# Patient Record
Sex: Female | Born: 1935 | Race: Black or African American | Hispanic: No | Smoking: Current some day smoker
Health system: Southern US, Community
[De-identification: ages and names within clinical notes are randomized; demographics above are authoritative.]

## PROBLEM LIST (undated history)

## (undated) DIAGNOSIS — Z8042 Family history of malignant neoplasm of prostate: Secondary | ICD-10-CM

## (undated) DIAGNOSIS — K635 Polyp of colon: Secondary | ICD-10-CM

## (undated) DIAGNOSIS — Z803 Family history of malignant neoplasm of breast: Secondary | ICD-10-CM

## (undated) DIAGNOSIS — C787 Secondary malignant neoplasm of liver and intrahepatic bile duct: Secondary | ICD-10-CM

## (undated) DIAGNOSIS — D6481 Anemia due to antineoplastic chemotherapy: Secondary | ICD-10-CM

## (undated) DIAGNOSIS — Z860101 Personal history of adenomatous and serrated colon polyps: Secondary | ICD-10-CM

## (undated) DIAGNOSIS — K2210 Ulcer of esophagus without bleeding: Secondary | ICD-10-CM

## (undated) DIAGNOSIS — K222 Esophageal obstruction: Secondary | ICD-10-CM

## (undated) DIAGNOSIS — F329 Major depressive disorder, single episode, unspecified: Secondary | ICD-10-CM

## (undated) DIAGNOSIS — Z79891 Long term (current) use of opiate analgesic: Secondary | ICD-10-CM

## (undated) DIAGNOSIS — Z66 Do not resuscitate: Secondary | ICD-10-CM

## (undated) DIAGNOSIS — F419 Anxiety disorder, unspecified: Secondary | ICD-10-CM

## (undated) DIAGNOSIS — I10 Essential (primary) hypertension: Secondary | ICD-10-CM

## (undated) DIAGNOSIS — Z86010 Personal history of colonic polyps: Secondary | ICD-10-CM

## (undated) DIAGNOSIS — N200 Calculus of kidney: Secondary | ICD-10-CM

## (undated) DIAGNOSIS — K529 Noninfective gastroenteritis and colitis, unspecified: Secondary | ICD-10-CM

## (undated) DIAGNOSIS — T451X5A Adverse effect of antineoplastic and immunosuppressive drugs, initial encounter: Principal | ICD-10-CM

## (undated) DIAGNOSIS — K219 Gastro-esophageal reflux disease without esophagitis: Secondary | ICD-10-CM

## (undated) DIAGNOSIS — F32A Depression, unspecified: Secondary | ICD-10-CM

## (undated) DIAGNOSIS — C259 Malignant neoplasm of pancreas, unspecified: Secondary | ICD-10-CM

## (undated) HISTORY — DX: Anemia due to antineoplastic chemotherapy: D64.81

## (undated) HISTORY — DX: Essential (primary) hypertension: I10

## (undated) HISTORY — DX: Long term (current) use of opiate analgesic: Z79.891

## (undated) HISTORY — DX: Polyp of colon: K63.5

## (undated) HISTORY — DX: Family history of malignant neoplasm of prostate: Z80.42

## (undated) HISTORY — DX: Esophageal obstruction: K22.2

## (undated) HISTORY — DX: Personal history of adenomatous and serrated colon polyps: Z86.0101

## (undated) HISTORY — DX: Major depressive disorder, single episode, unspecified: F32.9

## (undated) HISTORY — DX: Calculus of kidney: N20.0

## (undated) HISTORY — DX: Do not resuscitate: Z66

## (undated) HISTORY — DX: Adverse effect of antineoplastic and immunosuppressive drugs, initial encounter: T45.1X5A

## (undated) HISTORY — DX: Personal history of colonic polyps: Z86.010

## (undated) HISTORY — DX: Ulcer of esophagus without bleeding: K22.10

## (undated) HISTORY — DX: Depression, unspecified: F32.A

## (undated) HISTORY — DX: Secondary malignant neoplasm of liver and intrahepatic bile duct: C78.7

## (undated) HISTORY — PX: BREAST LUMPECTOMY: SHX2

## (undated) HISTORY — DX: Gastro-esophageal reflux disease without esophagitis: K21.9

## (undated) HISTORY — DX: Noninfective gastroenteritis and colitis, unspecified: K52.9

## (undated) HISTORY — DX: Malignant neoplasm of pancreas, unspecified: C25.9

## (undated) HISTORY — DX: Anxiety disorder, unspecified: F41.9

## (undated) HISTORY — DX: Family history of malignant neoplasm of breast: Z80.3

---

## 1963-05-13 HISTORY — PX: CHOLECYSTECTOMY: SHX55

## 2000-09-01 ENCOUNTER — Ambulatory Visit (HOSPITAL_COMMUNITY): Admission: RE | Admit: 2000-09-01 | Discharge: 2000-09-01 | Payer: Self-pay | Admitting: Internal Medicine

## 2000-09-15 ENCOUNTER — Emergency Department (HOSPITAL_COMMUNITY): Admission: EM | Admit: 2000-09-15 | Discharge: 2000-09-15 | Payer: Self-pay | Admitting: *Deleted

## 2001-02-22 ENCOUNTER — Ambulatory Visit (HOSPITAL_COMMUNITY): Admission: RE | Admit: 2001-02-22 | Discharge: 2001-02-22 | Payer: Self-pay | Admitting: Pulmonary Disease

## 2001-06-02 ENCOUNTER — Ambulatory Visit (HOSPITAL_COMMUNITY): Admission: RE | Admit: 2001-06-02 | Discharge: 2001-06-02 | Payer: Self-pay | Admitting: Pulmonary Disease

## 2002-03-21 ENCOUNTER — Ambulatory Visit (HOSPITAL_COMMUNITY): Admission: RE | Admit: 2002-03-21 | Discharge: 2002-03-21 | Payer: Self-pay | Admitting: Internal Medicine

## 2002-06-03 ENCOUNTER — Ambulatory Visit (HOSPITAL_COMMUNITY): Admission: RE | Admit: 2002-06-03 | Discharge: 2002-06-03 | Payer: Self-pay | Admitting: Pulmonary Disease

## 2003-10-18 ENCOUNTER — Ambulatory Visit (HOSPITAL_COMMUNITY): Admission: RE | Admit: 2003-10-18 | Discharge: 2003-10-18 | Payer: Self-pay | Admitting: Internal Medicine

## 2003-11-22 ENCOUNTER — Ambulatory Visit (HOSPITAL_COMMUNITY): Admission: RE | Admit: 2003-11-22 | Discharge: 2003-11-22 | Payer: Self-pay | Admitting: Internal Medicine

## 2003-11-22 HISTORY — PX: ESOPHAGOGASTRODUODENOSCOPY: SHX1529

## 2004-01-09 ENCOUNTER — Ambulatory Visit (HOSPITAL_COMMUNITY): Admission: RE | Admit: 2004-01-09 | Discharge: 2004-01-09 | Payer: Self-pay | Admitting: Internal Medicine

## 2004-05-01 ENCOUNTER — Ambulatory Visit (HOSPITAL_COMMUNITY): Admission: RE | Admit: 2004-05-01 | Discharge: 2004-05-01 | Payer: Self-pay | Admitting: Pulmonary Disease

## 2004-05-23 ENCOUNTER — Ambulatory Visit (HOSPITAL_COMMUNITY): Admission: RE | Admit: 2004-05-23 | Discharge: 2004-05-23 | Payer: Self-pay | Admitting: Pulmonary Disease

## 2004-12-12 ENCOUNTER — Ambulatory Visit (HOSPITAL_COMMUNITY): Admission: RE | Admit: 2004-12-12 | Discharge: 2004-12-12 | Payer: Self-pay | Admitting: Internal Medicine

## 2004-12-12 ENCOUNTER — Encounter: Payer: Self-pay | Admitting: Internal Medicine

## 2004-12-12 ENCOUNTER — Encounter: Payer: Self-pay | Admitting: Family Medicine

## 2004-12-12 ENCOUNTER — Ambulatory Visit: Payer: Self-pay | Admitting: Internal Medicine

## 2005-01-06 ENCOUNTER — Ambulatory Visit (HOSPITAL_COMMUNITY): Admission: RE | Admit: 2005-01-06 | Discharge: 2005-01-06 | Payer: Self-pay | Admitting: Pulmonary Disease

## 2005-06-23 ENCOUNTER — Ambulatory Visit (HOSPITAL_COMMUNITY): Admission: RE | Admit: 2005-06-23 | Discharge: 2005-06-23 | Payer: Self-pay | Admitting: Pulmonary Disease

## 2005-11-25 ENCOUNTER — Ambulatory Visit (HOSPITAL_COMMUNITY): Admission: RE | Admit: 2005-11-25 | Discharge: 2005-11-25 | Payer: Self-pay | Admitting: Pulmonary Disease

## 2005-12-15 ENCOUNTER — Ambulatory Visit (HOSPITAL_COMMUNITY): Admission: RE | Admit: 2005-12-15 | Discharge: 2005-12-15 | Payer: Self-pay | Admitting: Urology

## 2006-05-18 ENCOUNTER — Ambulatory Visit: Payer: Self-pay | Admitting: Orthopedic Surgery

## 2006-06-01 ENCOUNTER — Ambulatory Visit: Payer: Self-pay | Admitting: Orthopedic Surgery

## 2006-06-04 ENCOUNTER — Encounter (HOSPITAL_COMMUNITY): Admission: RE | Admit: 2006-06-04 | Discharge: 2006-07-04 | Payer: Self-pay | Admitting: Orthopedic Surgery

## 2006-06-22 ENCOUNTER — Ambulatory Visit: Payer: Self-pay | Admitting: Orthopedic Surgery

## 2006-07-06 ENCOUNTER — Encounter (HOSPITAL_COMMUNITY): Admission: RE | Admit: 2006-07-06 | Discharge: 2006-08-05 | Payer: Self-pay | Admitting: Orthopedic Surgery

## 2006-07-17 ENCOUNTER — Ambulatory Visit (HOSPITAL_COMMUNITY): Admission: RE | Admit: 2006-07-17 | Discharge: 2006-07-17 | Payer: Self-pay | Admitting: Pulmonary Disease

## 2006-07-27 ENCOUNTER — Ambulatory Visit: Payer: Self-pay | Admitting: Orthopedic Surgery

## 2006-11-16 ENCOUNTER — Ambulatory Visit: Payer: Self-pay | Admitting: Orthopedic Surgery

## 2006-12-01 ENCOUNTER — Ambulatory Visit: Payer: Self-pay | Admitting: Gastroenterology

## 2006-12-15 ENCOUNTER — Ambulatory Visit: Payer: Self-pay | Admitting: Internal Medicine

## 2006-12-15 ENCOUNTER — Encounter: Payer: Self-pay | Admitting: Family Medicine

## 2006-12-15 ENCOUNTER — Encounter: Payer: Self-pay | Admitting: Internal Medicine

## 2006-12-15 ENCOUNTER — Ambulatory Visit (HOSPITAL_COMMUNITY): Admission: RE | Admit: 2006-12-15 | Discharge: 2006-12-15 | Payer: Self-pay | Admitting: Internal Medicine

## 2006-12-31 ENCOUNTER — Ambulatory Visit: Payer: Self-pay | Admitting: Internal Medicine

## 2007-02-17 ENCOUNTER — Ambulatory Visit: Payer: Self-pay | Admitting: Orthopedic Surgery

## 2007-02-17 DIAGNOSIS — M19019 Primary osteoarthritis, unspecified shoulder: Secondary | ICD-10-CM | POA: Insufficient documentation

## 2007-02-24 ENCOUNTER — Ambulatory Visit (HOSPITAL_COMMUNITY): Admission: RE | Admit: 2007-02-24 | Discharge: 2007-02-24 | Payer: Self-pay | Admitting: Orthopedic Surgery

## 2007-03-01 ENCOUNTER — Ambulatory Visit: Payer: Self-pay | Admitting: Orthopedic Surgery

## 2007-03-01 DIAGNOSIS — M75120 Complete rotator cuff tear or rupture of unspecified shoulder, not specified as traumatic: Secondary | ICD-10-CM | POA: Insufficient documentation

## 2007-04-14 ENCOUNTER — Ambulatory Visit: Payer: Self-pay | Admitting: Orthopedic Surgery

## 2007-04-14 DIAGNOSIS — M479 Spondylosis, unspecified: Secondary | ICD-10-CM | POA: Insufficient documentation

## 2007-04-22 ENCOUNTER — Encounter: Payer: Self-pay | Admitting: Orthopedic Surgery

## 2007-05-13 HISTORY — PX: OTHER SURGICAL HISTORY: SHX169

## 2007-05-19 ENCOUNTER — Telehealth: Payer: Self-pay | Admitting: Orthopedic Surgery

## 2007-05-20 ENCOUNTER — Telehealth: Payer: Self-pay | Admitting: Orthopedic Surgery

## 2007-05-24 ENCOUNTER — Ambulatory Visit: Payer: Self-pay | Admitting: Orthopedic Surgery

## 2007-05-28 ENCOUNTER — Encounter: Payer: Self-pay | Admitting: Orthopedic Surgery

## 2007-05-28 ENCOUNTER — Ambulatory Visit: Payer: Self-pay | Admitting: Orthopedic Surgery

## 2007-05-28 ENCOUNTER — Ambulatory Visit (HOSPITAL_COMMUNITY): Admission: RE | Admit: 2007-05-28 | Discharge: 2007-05-28 | Payer: Self-pay | Admitting: Orthopedic Surgery

## 2007-06-01 ENCOUNTER — Ambulatory Visit: Payer: Self-pay | Admitting: Orthopedic Surgery

## 2007-06-07 ENCOUNTER — Encounter (HOSPITAL_COMMUNITY): Admission: RE | Admit: 2007-06-07 | Discharge: 2007-07-07 | Payer: Self-pay | Admitting: Orthopedic Surgery

## 2007-06-09 ENCOUNTER — Encounter: Payer: Self-pay | Admitting: Orthopedic Surgery

## 2007-06-10 ENCOUNTER — Ambulatory Visit: Payer: Self-pay | Admitting: Orthopedic Surgery

## 2007-06-29 ENCOUNTER — Encounter: Payer: Self-pay | Admitting: Orthopedic Surgery

## 2007-07-01 ENCOUNTER — Encounter: Payer: Self-pay | Admitting: Orthopedic Surgery

## 2007-07-03 ENCOUNTER — Encounter: Payer: Self-pay | Admitting: Family Medicine

## 2007-07-09 ENCOUNTER — Encounter (HOSPITAL_COMMUNITY): Admission: RE | Admit: 2007-07-09 | Discharge: 2007-08-08 | Payer: Self-pay | Admitting: Orthopedic Surgery

## 2007-07-14 ENCOUNTER — Ambulatory Visit: Payer: Self-pay | Admitting: Orthopedic Surgery

## 2007-07-22 ENCOUNTER — Encounter: Payer: Self-pay | Admitting: Orthopedic Surgery

## 2008-06-28 ENCOUNTER — Ambulatory Visit (HOSPITAL_COMMUNITY): Admission: RE | Admit: 2008-06-28 | Discharge: 2008-06-28 | Payer: Self-pay | Admitting: Pulmonary Disease

## 2008-10-18 ENCOUNTER — Ambulatory Visit (HOSPITAL_COMMUNITY): Admission: RE | Admit: 2008-10-18 | Discharge: 2008-10-18 | Payer: Self-pay | Admitting: Pulmonary Disease

## 2008-10-18 ENCOUNTER — Encounter: Payer: Self-pay | Admitting: Family Medicine

## 2009-01-14 ENCOUNTER — Emergency Department (HOSPITAL_COMMUNITY): Admission: EM | Admit: 2009-01-14 | Discharge: 2009-01-14 | Payer: Self-pay | Admitting: Emergency Medicine

## 2009-07-02 ENCOUNTER — Ambulatory Visit (HOSPITAL_COMMUNITY): Admission: RE | Admit: 2009-07-02 | Discharge: 2009-07-02 | Payer: Self-pay | Admitting: Obstetrics and Gynecology

## 2010-01-07 ENCOUNTER — Ambulatory Visit: Payer: Self-pay | Admitting: Family Medicine

## 2010-01-07 DIAGNOSIS — E663 Overweight: Secondary | ICD-10-CM | POA: Insufficient documentation

## 2010-01-07 DIAGNOSIS — I10 Essential (primary) hypertension: Secondary | ICD-10-CM | POA: Insufficient documentation

## 2010-01-08 ENCOUNTER — Encounter: Payer: Self-pay | Admitting: Family Medicine

## 2010-01-17 ENCOUNTER — Encounter (INDEPENDENT_AMBULATORY_CARE_PROVIDER_SITE_OTHER): Payer: Self-pay | Admitting: *Deleted

## 2010-01-23 ENCOUNTER — Ambulatory Visit: Payer: Self-pay | Admitting: Family Medicine

## 2010-01-25 ENCOUNTER — Encounter: Payer: Self-pay | Admitting: Family Medicine

## 2010-01-25 LAB — CONVERTED CEMR LAB
ALT: 17 units/L (ref 0–35)
AST: 17 units/L (ref 0–37)
Albumin: 4.5 g/dL (ref 3.5–5.2)
BUN: 18 mg/dL (ref 6–23)
Basophils Relative: 0 % (ref 0–1)
CO2: 31 meq/L (ref 19–32)
Calcium: 9.3 mg/dL (ref 8.4–10.5)
Creatinine, Ser: 0.77 mg/dL (ref 0.40–1.20)
Eosinophils Absolute: 0.1 10*3/uL (ref 0.0–0.7)
Eosinophils Relative: 2 % (ref 0–5)
Glucose, Bld: 89 mg/dL (ref 70–99)
HCT: 41.4 % (ref 36.0–46.0)
HDL: 70 mg/dL (ref >39)
Hemoglobin: 14.1 g/dL (ref 12.0–15.0)
LDL Cholesterol: 134 mg/dL — ABNORMAL HIGH (ref 0–99)
MCV: 83.8 fL (ref 78.0–100.0)
Neutro Abs: 4.8 10*3/uL (ref 1.7–7.7)
Potassium: 4.2 meq/L (ref 3.5–5.3)
RDW: 15 % (ref 11.5–15.5)
Sodium: 143 meq/L (ref 135–145)
TSH: 0.837 microintl units/mL (ref 0.350–4.500)
Triglycerides: 83 mg/dL (ref <150)
WBC: 7.1 10*3/uL (ref 4.0–10.5)

## 2010-01-27 DIAGNOSIS — L259 Unspecified contact dermatitis, unspecified cause: Secondary | ICD-10-CM | POA: Insufficient documentation

## 2010-02-05 ENCOUNTER — Ambulatory Visit (HOSPITAL_COMMUNITY): Payer: Self-pay | Admitting: Psychiatry

## 2010-02-13 ENCOUNTER — Ambulatory Visit: Payer: Self-pay | Admitting: Family Medicine

## 2010-02-21 ENCOUNTER — Ambulatory Visit (HOSPITAL_COMMUNITY): Payer: Self-pay | Admitting: Psychiatry

## 2010-03-07 ENCOUNTER — Ambulatory Visit (HOSPITAL_COMMUNITY): Payer: Self-pay | Admitting: Psychiatry

## 2010-03-08 ENCOUNTER — Encounter: Payer: Self-pay | Admitting: Internal Medicine

## 2010-03-19 ENCOUNTER — Ambulatory Visit: Payer: Self-pay | Admitting: Internal Medicine

## 2010-03-19 ENCOUNTER — Ambulatory Visit (HOSPITAL_COMMUNITY): Admission: RE | Admit: 2010-03-19 | Discharge: 2010-03-19 | Payer: Self-pay | Admitting: Internal Medicine

## 2010-03-19 DIAGNOSIS — K635 Polyp of colon: Secondary | ICD-10-CM

## 2010-03-19 HISTORY — DX: Polyp of colon: K63.5

## 2010-03-19 HISTORY — PX: COLONOSCOPY: SHX174

## 2010-04-17 ENCOUNTER — Ambulatory Visit: Payer: Self-pay | Admitting: Internal Medicine

## 2010-04-17 DIAGNOSIS — R197 Diarrhea, unspecified: Secondary | ICD-10-CM | POA: Insufficient documentation

## 2010-04-18 ENCOUNTER — Ambulatory Visit: Payer: Self-pay | Admitting: Family Medicine

## 2010-04-18 ENCOUNTER — Encounter: Payer: Self-pay | Admitting: Internal Medicine

## 2010-04-18 LAB — CONVERTED CEMR LAB: Tissue Transglutaminase Ab, IgA: 7.6 units (ref <20)

## 2010-05-14 ENCOUNTER — Telehealth (INDEPENDENT_AMBULATORY_CARE_PROVIDER_SITE_OTHER): Payer: Self-pay | Admitting: *Deleted

## 2010-05-15 ENCOUNTER — Telehealth (INDEPENDENT_AMBULATORY_CARE_PROVIDER_SITE_OTHER): Payer: Self-pay

## 2010-06-02 ENCOUNTER — Encounter: Payer: Self-pay | Admitting: Pulmonary Disease

## 2010-06-03 ENCOUNTER — Other Ambulatory Visit: Payer: Self-pay | Admitting: Family Medicine

## 2010-06-03 DIAGNOSIS — Z139 Encounter for screening, unspecified: Secondary | ICD-10-CM

## 2010-06-07 ENCOUNTER — Ambulatory Visit (HOSPITAL_COMMUNITY): Payer: Self-pay

## 2010-06-11 NOTE — Assessment & Plan Note (Signed)
Summary: NEW PATIENT PER DR   Vital Signs:  Patient profile:   77 year old female Menstrual status:  hysterectomy Height:      63.5 inches Weight:      168.25 pounds BMI:     29.44 O2 Sat:      98 % Pulse rate:   60 / minute Pulse rhythm:   regular Resp:     16 per minute BP sitting:   140 / 82  (left arm)  Vitals Entered By: Everitt Amber LPN (January 07, 2010 1:29 PM)  Nutrition Counseling: Patient's BMI is greater than 25 and therefore counseled on weight management options. CC: Dr. Juanetta Gosling was giving her Exforge and he increased the dose to 5/320 and she broke out in a bad rash and she stopped it and it still hasn't went away      Menstrual Status hysterectomy   CC:  Dr. Juanetta Gosling was giving her Exforge and he increased the dose to 5/320 and she broke out in a bad rash and she stopped it and it still hasn't went away .  History of Present Illness: new pt eval for this recent widow, who has noted increased anxiety, withexcessive crying at times and difficulty sleeping also. he has a goodfamily support, amongst her siblings, her 2 adult children live in [place], they are suppoirrtive, and she is willing o consider going to spend some time with them. Her spouse of 52 yrs passedunexpectedly after a short battle with pancreatic ca withn 1 month of diagnosi. She ha recently been having problems with her Bp , reports an adverse rxn to exforge, rash in albow folds also behind her neck, I hardly think such isolated rashes are due to the drug. She reports poor sleep and weight gain in the past several months dueto poor eaing habits.   Preventive Screening-Counseling & Management  Alcohol-Tobacco     Smoking Status: never  Caffeine-Diet-Exercise     Does Patient Exercise: no      Drug Use:  no.    Current Medications (verified): 1)  Aleve .... Uad 2)  Clonazepam 0.5 Mg Tabs (Clonazepam) .... Take 1 Tablet By Mouth Two Times A Day 3)  Bystolic 10 Mg Tabs (Nebivolol Hcl) .... Take  1 Tablet By Mouth Once A Day 4)  Cetirizine Hcl 10 Mg Tabs (Cetirizine Hcl) .... Take 1 Tablet By Mouth Once A Day 5)  Azor 10-40 Mg Tabs (Amlodipine-Olmesartan) .... Take 1 Tablet By Mouth Once A Day 6)  Geritol Complete  Tabs (Iron-Vitamins) .... Take 1 Tablet By Mouth Once A Day 7)  Vitamin E Complex 1000 Unit Caps (Vitamin E) .... Take 1 Tablet By Mouth Once A Day  Allergies: 1)  ! Penicillin 2)  ! * Exforge  Past History:  Past Medical History: Hypertension x 20 years  Past Surgical History: Stomach ulcer (had some of stomach removed) x 50 years ago Cholecystectomy x 30 years ago  Family History: Family History of Diabetes Family History of Arthritis Hx, family, kidney disease NEC  Mother-deceased-HTN  Father-deceased-unknown 2 brothers-deceased-one had been on dialysis 2 sisters with Diabetes Sister-colon cancer  Social History: Retired Widow since 08/2009 after 52 yrs of marriage quit smoking in 2009 Alcohol use-no Drug use-no Regular exercise-yes sometimes Drug Use:  no Does Patient Exercise:  no  Review of Systems      See HPI General:  Complains of fatigue, loss of appetite, and sleep disorder; denies chills and fever. Eyes:  Denies eye pain and red  eye. ENT:  Denies hoarseness, nasal congestion, sinus pressure, and sore throat. CV:  Denies chest pain or discomfort, difficulty breathing while lying down, palpitations, and swelling of feet. Resp:  Denies cough, pleuritic, and sputum productive. GI:  Complains of diarrhea; 2 year h/o diarreah, uses immodium twice per week. GU:  Denies dysuria, incontinence, and urinary frequency. MS:  Complains of joint pain, low back pain, mid back pain, and stiffness; mild to moderate symptoms. Derm:  Complains of itching and lesion(s); dark itchy rash in elbow creases and behind neck. Neuro:  Complains of memory loss; denies headaches, seizures, and sensation of room spinning; unable to find her car keys. Psych:   Complains of anxiety, depression, easily tearful, mental problems, and unusual visions or sounds; denies easily angered, suicidal thoughts/plans, and thoughts of violence; states she sometimes thinks she sees her deceased spouse, is interested both in therapy as well as meds, never had mental health issues in the past. Endo:  Denies excessive thirst and excessive urination. Heme:  Denies abnormal bruising and bleeding. Allergy:  Complains of seasonal allergies; denies hives or rash and itching eyes.  Physical Exam  General:  Well-developed,well-nourished,in no acute distress; alert,appropriate and cooperative throughout examination HEENT: No facial asymmetry,  EOMI, No sinus tenderness, TM's Clear, oropharynx  pink and moist.   Chest: Clear to auscultation bilaterally.  CVS: S1, S2, No murmurs, No S3.   Abd: Soft, Nontender.  MS: Adequate ROM spine, hips, shoulders and knees.  Ext: No edema.   CNS: CN 2-12 intact, power tone and sensation normal throughout.   Skin: Intact, hyperpigmented macular rashes on elbow creases anteriorly also on back of neck left side  Psych: Good eye contact, normal affect.  Memory intact, mildly anxious and  depressed appearing.Tearful at times.      Impression & Recommendations:  Problem # 1:  DEPRESSION (ICD-311) Assessment Deteriorated  Her updated medication list for this problem includes:    Clonazepam 0.5 Mg Tabs (Clonazepam) .Marland Kitchen... Take 1 tablet by mouth two times a day    Citalopram Hydrobromide 10 Mg Tabs (Citalopram hydrobromide) .Marland Kitchen... Take 1 tablet by mouth once a day  Discussed treatment options, including trial of antidpressant medication. Will refer to behavioral health. Follow-up call in in 24-48 hours and recheck in 2 weeks, sooner as needed. Patient agrees to call if any worsening of symptoms or thoughts of doing harm arise. Verified that the patient has no suicidal ideation at this time.  will refer pt for therapy through hospice, and she is  to resume walking regularly  Problem # 2:  MOURNING (ICD-309.0) Assessment: Comment Only i DISCUSSED THE NATURAL PHENOMENON OF MOURNING THE LOSS OF A LOVED ONE AND ANTICIPATE IMPROVEMENT WITH TIME AND PSYCHOLOGICAL SUPPORT  Problem # 3:  ESSENTIAL HYPERTENSION (ICD-401.9) Assessment: Comment Only  Her updated medication list for this problem includes:    Bystolic 10 Mg Tabs (Nebivolol hcl) .Marland Kitchen... Take 1 tablet by mouth once a day    Azor 10-40 Mg Tabs (Amlodipine-olmesartan) .Marland Kitchen... Take 1 tablet by mouth once a day  BP today: 140/82, no med change at this time, dASH diet and exercise as well as reduced sodium intake discussed and encouraged  Problem # 4:  OVERWEIGHT (ICD-278.02) Assessment: Comment Only  Ht: 63.5 (01/07/2010)   Wt: 168.25 (01/07/2010)   BMI: 29.44 (01/07/2010), reduced caloric intake and regular exercise discussed and encouraged tofacilitate weight loss  Complete Medication List: 1)  Aleve  .... Uad 2)  Clonazepam 0.5 Mg Tabs (Clonazepam) .Marland KitchenMarland KitchenMarland Kitchen  Take 1 tablet by mouth two times a day 3)  Bystolic 10 Mg Tabs (Nebivolol hcl) .... Take 1 tablet by mouth once a day 4)  Cetirizine Hcl 10 Mg Tabs (Cetirizine hcl) .... Take 1 tablet by mouth once a day 5)  Azor 10-40 Mg Tabs (Amlodipine-olmesartan) .... Take 1 tablet by mouth once a day 6)  Geritol Complete Tabs (Iron-vitamins) .... Take 1 tablet by mouth once a day 7)  Vitamin E Complex 1000 Unit Caps (Vitamin e) .... Take 1 tablet by mouth once a day 8)  Citalopram Hydrobromide 10 Mg Tabs (Citalopram hydrobromide) .... Take 1 tablet by mouth once a day 9)  Azor 10-40 Mg Tabs (Amlodipine-olmesartan) .... Take 1 tablet by mouth once a day 10)  Betamethasone Valerate 0.1 % Crea (Betamethasone valerate) .... Apply sparingly twice daily for 10 days, then as needed  Other Orders: Tdap => 31yrs IM [phone]) Admin 1st Vaccine (60109) Admin 1st Vaccine [facility]) [phone]) Pneumococcal Vaccine (32202) Admin of Any Addtl Vaccine  (54270) Admin of Any Addtl Vaccine (State) [phone])  Patient Instructions: 1)  F/U in 6 weeks. 2)  no med changes at this time. 3)  New med for depression, and I will call hospice about getting therapy for you. 4)  Pls start walking at least 3 to 5 days per week. 5)  consider spending time away from here with your children, this will help 6)  Two vaccines today. 7)  Pls start 1 baby aspirin daily for cardiovascular health 8)  Flu vaccine will be available mid Sept we will call Prescriptions: BETAMETHASONE VALERATE 0.1 % CREA (BETAMETHASONE VALERATE) apply sparingly twice daily for 10 days, then as needed  #45 gm x 1   Entered and Authorized by:   Syliva Overman MD   Signed by:   Syliva Overman MD on 01/07/2010   Method used:   Printed then faxed to ...         RxID:   [id number] AZOR 10-40 MG TABS (AMLODIPINE-OLMESARTAN) Take 1 tablet by mouth once a day  #90 x 2   Entered and Authorized by:   Syliva Overman MD   Signed by:   Syliva Overman MD on 01/07/2010   Method used:   Printed then faxed to ...         RxID:   [id number] CITALOPRAM HYDROBROMIDE 10 MG TABS (CITALOPRAM HYDROBROMIDE) Take 1 tablet by mouth once a day  #30 x 4   Entered and Authorized by:   Syliva Overman MD   Signed by:   Syliva Overman MD on 01/07/2010   Method used:   Printed then faxed to ...         RxID:   [id number]        Tetanus/Td Vaccine    Vaccine Type: Tdap    Site: right deltoid    Mfr: boosterix    Dose: 0.5 ml    Route: IM    Given by: Everitt Amber LPN    Exp. Date: 11/09/2011    Lot #: [id number]  Pneumovax    Vaccine Type: Pneumovax    Site: left deltoid    Mfr: Merck    Dose: 0.5 ml    Route: IM    Given by: Everitt Amber LPN    Exp. Date: 05/29/2011    Lot #: [id number]    Appended Document: NEW PATIENT PER DR pls call hospice, let them know that htispt's husband died in May 11, 2011Erie Noe was their pt, she needs grief  counsellng see if therapist  aailable to see her for a couple of sessions, if not pls let me know  Appended Document: NEW PATIENT PER DR tried to contact patient, no answer. Mailed her a letter with all the information on it that she would need to get this service. Letter scanned in chart

## 2010-06-11 NOTE — Op Note (Signed)
Summary: colonoscopy with cold snare polypectomy  colonoscopy with cold snare polypectomy   Imported By: Lind Guest 01/09/2010 08:25:13  _____________________________________________________________________  External Attachment:    Type:   Image     Comment:   External Document

## 2010-06-11 NOTE — Letter (Signed)
Summary: [facility] ORDER/[facility]  [facility] ORDER/[facility]   Imported By: Rexene Alberts 03/08/2010 11:54:44  _____________________________________________________________________  External Attachment:    Type:   Image     Comment:   External Document

## 2010-06-11 NOTE — Assessment & Plan Note (Signed)
Summary: OV   Vital Signs:  Patient profile:   77 year old female Menstrual status:  hysterectomy Height:      63.5 inches Weight:      168.75 pounds BMI:     29.53 O2 Sat:      96 % Pulse rate:   55 / minute Pulse rhythm:   regular Resp:     16 per minute BP sitting:   150 / 82  (left arm) Cuff size:   large  Vitals Entered By: Everitt Amber LPN (January 23, 2010 11:06 AM)  Nutrition Counseling: Patient's BMI is greater than 25 and therefore counseled on weight management options. CC: Follow up chronic problems    CC:  Follow up chronic problems .  History of Present Illness: Pt continues to experience alot of anxiety despite SSRI, she is asking for an inc in her anxiolytic. She is now ready for therapy to help her to cope with her husbands's loss.she is still very sad and tearful and often overwhelmed.she will spend 1 week with one of her sons and his family out of state , and she is excited about this. She reports very poor sleep and remains fatigued all the time, sleeps at most 4 hrs per night. She denies any adverse s/r from her meds.She denies fever, chills , head or chest congestion, and wants to return to get her flu vac aftrer travel.  Current Medications (verified): 1)  Aleve .... Uad 2)  Clonazepam 0.5 Mg Tabs (Clonazepam) .... Take 1 Tablet By Mouth Two Times A Day 3)  Bystolic 10 Mg Tabs (Nebivolol Hcl) .... Take 1 Tablet By Mouth Once A Day 4)  Cetirizine Hcl 10 Mg Tabs (Cetirizine Hcl) .... Take 1 Tablet By Mouth Once A Day 5)  Geritol Complete  Tabs (Iron-Vitamins) .... Take 1 Tablet By Mouth Once A Day 6)  Vitamin E Complex 1000 Unit Caps (Vitamin E) .... Take 1 Tablet By Mouth Once A Day 7)  Citalopram Hydrobromide 10 Mg Tabs (Citalopram Hydrobromide) .... Take 1 Tablet By Mouth Once A Day 8)  Azor 10-40 Mg Tabs (Amlodipine-Olmesartan) .... Take 1 Tablet By Mouth Once A Day 9)  Betamethasone Valerate 0.1 % Crea (Betamethasone Valerate) .... Apply Sparingly  Twice Daily For 10 Days, Then As Needed 10)  Imodium Advanced 2-125 Mg Tabs (Loperamide-Simethicone) .... One Tab Every Other Day For Diarrhea  Allergies (verified): 1)  ! Penicillin 2)  ! * Exforge  Review of Systems      See HPI General:  Complains of fatigue and sleep disorder. Eyes:  Denies discharge and red eye. ENT:  Denies hoarseness, nasal congestion, and sinus pressure. CV:  Denies chest pain or discomfort, palpitations, and swelling of feet. Resp:  Denies cough and sputum productive. GI:  Complains of loss of appetite; denies abdominal pain, constipation, diarrhea, nausea, and vomiting. GU:  Denies dysuria and urinary frequency. MS:  Complains of joint pain. Derm:  Complains of lesion(s) and rash; REPORTS LITTLE IMPROVEMENT IN RASHES. Psych:  Complains of anxiety, depression, and easily tearful; denies suicidal thoughts/plans, thoughts of violence, and unusual visions or sounds. Endo:  Denies excessive thirst and excessive urination. Heme:  Denies abnormal bruising and bleeding. Allergy:  Complains of seasonal allergies.  Physical Exam  General:  Well-developed,well-nourished,in no acute distress; alert,appropriate and cooperative throughout examination. Tearful AND ANXIOUS. HEENT: No facial asymmetry,  EOMI, No sinus tenderness, TM's Clear, oropharynx  pink and moist.   Chest: Clear to auscultation bilaterally.  CVS: S1,  S2, No murmurs, No S3.   Abd: Soft, Nontender.  MS: Adequate ROM spine, hips, shoulders and knees.  Ext: No edema.   CNS: CN 2-12 intact, power tone and sensation normal throughout.   Skin: Intact, hyperpigmented macular rashes on elbow creases anteriorly also on back of neck left side  Psych: Good eye contact, normal affect.  Memory intact, mildly anxious and  depressed appearing.Tearful at times.      Impression & Recommendations:  Problem # 1:  MOURNING (ICD-309.0) Assessment Deteriorated  Orders: Psychology Referral (Psychology)  Problem  # 2:  ESSENTIAL HYPERTENSION (ICD-401.9) Assessment: Deteriorated  The following medications were removed from the medication list:    Azor 10-40 Mg Tabs (Amlodipine-olmesartan) .Marland Kitchen... Take 1 tablet by mouth once a day Her updated medication list for this problem includes:    Bystolic 10 Mg Tabs (Nebivolol hcl) .Marland Kitchen... Take 1 tablet by mouth once a day    Azor 10-40 Mg Tabs (Amlodipine-olmesartan) .Marland Kitchen... Take 1 tablet by mouth once a day  BP today: 150/82 Prior BP: 140/82 (01/07/2010)  Problem # 3:  DEPRESSION (ICD-311) Assessment: Deteriorated  The following medications were removed from the medication list:    Clonazepam 0.5 Mg Tabs (Clonazepam) .Marland Kitchen... Take 1 tablet by mouth two times a day Her updated medication list for this problem includes:    Citalopram Hydrobromide 10 Mg Tabs (Citalopram hydrobromide) .Marland Kitchen... Take 1 tablet by mouth once a day    Klonopin 0.5 Mg Tabs (Clonazepam) .Marland Kitchen... Take 1 tablet by mouth three times a day  Orders: Psychology Referral (Psychology)  Problem # 4:  DERMATITIS (ICD-692.9) Assessment: Unchanged  Her updated medication list for this problem includes:    Cetirizine Hcl 10 Mg Tabs (Cetirizine hcl) .Marland Kitchen... Take 1 tablet by mouth once a day    Betamethasone Valerate 0.1 % Crea (Betamethasone valerate) .Marland Kitchen... Apply sparingly twice daily for 10 days, then as needed hydroquinone prescribed also  Complete Medication List: 1)  Aleve  .... Uad 2)  Bystolic 10 Mg Tabs (Nebivolol hcl) .... Take 1 tablet by mouth once a day 3)  Cetirizine Hcl 10 Mg Tabs (Cetirizine hcl) .... Take 1 tablet by mouth once a day 4)  Geritol Complete Tabs (Iron-vitamins) .... Take 1 tablet by mouth once a day 5)  Vitamin E Complex 1000 Unit Caps (Vitamin e) .... Take 1 tablet by mouth once a day 6)  Citalopram Hydrobromide 10 Mg Tabs (Citalopram hydrobromide) .... Take 1 tablet by mouth once a day 7)  Azor 10-40 Mg Tabs (Amlodipine-olmesartan) .... Take 1 tablet by mouth once a  day 8)  Betamethasone Valerate 0.1 % Crea (Betamethasone valerate) .... Apply sparingly twice daily for 10 days, then as needed 9)  Imodium Advanced 2-125 Mg Tabs (Loperamide-simethicone) .... One tab every other day for diarrhea 10)  Temazepam 15 Mg Caps (Temazepam) .... Take 1 capsule by mouth at bedtime 11)  Klonopin 0.5 Mg Tabs (Clonazepam) .... Take 1 tablet by mouth three times a day 12)  Hydroquinone 4 % Crea (Hydroquinone) .... Apply paringly twice daily to rash for 10 days , then as needed  Other Orders: T-Basic Metabolic Panel [id number] T-Lipid Profile [phone]) T-Hepatic Function [phone]) T-CBC w/Diff [phone]) T-TSH [phone])  Patient Instructions: 1)  Please schedule a follow-up appointment in 1 month. 2)  BMP prior to visit, ICD-9: 3)  Hepatic Panel prior to visit, ICD-9: 4)  Lipid Panel prior to visit, ICD-9:  fasting today 5)  TSH prior to visit, ICD-9: 6)  CBC  w/ Diff prior to visit, ICD-9:,  7)  new med for sleep is added, pls practice good sleep hygiene. 8)  pls call for your flu vaccine 9)  continue all other meds as before. 10)  you will be referred for counselling Prescriptions: HYDROQUINONE 4 % CREA (HYDROQUINONE) APPLY PARINGLY TWICE DAILY TO RASH FOR 10 DAYS , THEN AS NEEDED  #45gm x 0   Entered and Authorized by:   Syliva Overman MD   Signed by:   Syliva Overman MD on 01/27/2010   Method used:   Historical   RxID:   [id number] KLONOPIN 0.5 MG TABS (CLONAZEPAM) Take 1 tablet by mouth three times a day  #90 x 2   Entered and Authorized by:   Syliva Overman MD   Signed by:   Syliva Overman MD on 01/23/2010   Method used:   Printed then faxed to ...       [facility]  [address] [phone]* (retail)       [address]       [place], [place]  [address]       Ph: [phone] or [phone]       Fax: [phone]   RxID:   [id number] TEMAZEPAM 15 MG CAPS (TEMAZEPAM) Take 1 capsule by mouth at bedtime  #30 x 1    Entered and Authorized by:   Syliva Overman MD   Signed by:   Syliva Overman MD on 01/23/2010   Method used:   Printed then faxed to ...       [facility]  [address] [phone]* (retail)       [address]       [place], [place]  [address]       Ph: [phone] or [phone]       Fax: [phone]   RxID:   [id number]

## 2010-06-11 NOTE — Assessment & Plan Note (Signed)
Summary: office visit   Vital Signs:  Patient profile:   77 year old female Menstrual status:  hysterectomy Height:      63.5 inches Weight:      168 pounds O2 Sat:      93 % on Room air Pulse rhythm:   regular Resp:     16 per minute BP sitting:   140 / 80  (left arm)  Vitals Entered By: Mauricia Area  O2 Flow:  Room air CC: follow up Is Patient Diabetic? No Pain Assessment Patient in pain? no        CC:  follow up.  History of Present Illness: Reports  that she has been doing better, since starting med for anxiety as well as ar Denies recent fever or chillsttending therapy, both with hospice and behav health, she will continue with the latter. Denies sinus pressure, nasal congestion , ear pain or sore throat. Denies chest congestion, or cough productive of sputum. Denies chest pain, palpitations, PND, orthopnea or leg swelling.Tolerating bP meds well. Denies abdominal pain, nausea, vomitting, diarrhea or constipation. Denies change in bowel movements or bloody stool. Denies dysuria , frequency, incontinence or hesitancy. Denies  joint pain, swelling, or reduced mobility. Denies headaches, vertigo, seizures. Impoved insommnia on meds which she is tolerating well..      Current Medications (verified): 1)  Bystolic 10 Mg Tabs (Nebivolol Hcl) .... Take 1 Tablet By Mouth Once A Day 2)  Cetirizine Hcl 10 Mg Tabs (Cetirizine Hcl) .... Take 1 Tablet By Mouth Once A Day 3)  Geritol Complete  Tabs (Iron-Vitamins) .... Take 1 Tablet By Mouth Once A Day 4)  Vitamin E Complex 1000 Unit Caps (Vitamin E) .... Take 1 Tablet By Mouth Once A Day 5)  Citalopram Hydrobromide 10 Mg Tabs (Citalopram Hydrobromide) .... Take 1 Tablet By Mouth Once A Day 6)  Azor 10-40 Mg Tabs (Amlodipine-Olmesartan) .... Take 1 Tablet By Mouth Once A Day 7)  Imodium Advanced 2-125 Mg Tabs (Loperamide-Simethicone) .... One Tab Every Other Day For Diarrhea 8)  Temazepam 15 Mg Caps (Temazepam) .... Take 1  Capsule By Mouth At Bedtime 9)  Klonopin 0.5 Mg Tabs (Clonazepam) .... Take 1 Tablet By Mouth Three Times A Day  Allergies (verified): 1)  ! Penicillin 2)  ! * Exforge  Review of Systems      See HPI General:  Complains of fatigue and sleep disorder; improved. Eyes:  Denies blurring and discharge. Derm:  Complains of itching, lesion(s), and rash; persiting puritic dark lesions in elbow creases,  mild improvement . Psych:  Complains of anxiety, depression, and easily tearful; denies mental problems, suicidal thoughts/plans, thoughts of violence, and unusual visions or sounds; all symptoms have improved. Endo:  Denies cold intolerance, excessive thirst, excessive urination, and heat intolerance. Heme:  Denies abnormal bruising and bleeding. Allergy:  Denies hives or rash and itching eyes.  Physical Exam  General:  Well-developed,well-nourished,in no acute distress; alert,appropriate and cooperative throughout examination.  HEENT: No facial asymmetry,  EOMI, No sinus tenderness, TM's Clear, oropharynx  pink and moist.   Chest: Clear to auscultation bilaterally.  CVS: S1, S2, No murmurs, No S3.   Abd: Soft, Nontender.  MS: Adequate ROM spine, hips, shoulders and knees.  Ext: No edema.   CNS: CN 2-12 intact, power tone and sensation normal throughout.   Skin: Intact, hyperpigmented macular rashes on elbow creases anteriorly also on back of neck left side , improved, less pigmentede Psych: Good eye contact, normal affect.  Memory intact, less anxipous and depressed appearing.Tearful at times.      Impression & Recommendations:  Problem # 1:  DERMATITIS (ICD-692.9) Assessment Improved  The following medications were removed from the medication list:    Betamethasone Valerate 0.1 % Crea (Betamethasone valerate) .Marland Kitchen... Apply sparingly twice daily for 10 days, then as needed Her updated medication list for this problem includes:    Cetirizine Hcl 10 Mg Tabs (Cetirizine hcl) .Marland Kitchen... Take 1  tablet by mouth once a day still not back to nl, pt not wanting to see derm at this time however  Problem # 2:  ESSENTIAL HYPERTENSION (ICD-401.9) Assessment: Improved  The following medications were removed from the medication list:    Azor 10-40 Mg Tabs (Amlodipine-olmesartan) .Marland Kitchen... Take 1 tablet by mouth once a day Her updated medication list for this problem includes:    Bystolic 10 Mg Tabs (Nebivolol hcl) .Marland Kitchen... Take 1 tablet by mouth once a day    Azor 10-40 Mg Tabs (Amlodipine-olmesartan) .Marland Kitchen... Take 1 tablet by mouth once a day  BP today: 140/80 Prior BP: 150/82 (01/23/2010)  Labs Reviewed: K+: 4.2 (01/23/2010) Creat: : 0.77 (01/23/2010)   Chol: 221 (01/23/2010)   HDL: 70 (01/23/2010)   LDL: 134 (01/23/2010)   TG: 83 (01/23/2010)  Problem # 3:  DEPRESSION (ICD-311) Assessment: Improved  The following medications were removed from the medication list:    Citalopram Hydrobromide 10 Mg Tabs (Citalopram hydrobromide) .Marland Kitchen... Take 1 tablet by mouth once a day Her updated medication list for this problem includes:    Klonopin 0.5 Mg Tabs (Clonazepam) .Marland Kitchen... Take 1 tablet by mouth three times a day    Citalopram Hydrobromide 10 Mg Tabs (Citalopram hydrobromide) .Marland Kitchen... Take 1 tablet by mouth once a day  Complete Medication List: 1)  Bystolic 10 Mg Tabs (Nebivolol hcl) .... Take 1 tablet by mouth once a day 2)  Cetirizine Hcl 10 Mg Tabs (Cetirizine hcl) .... Take 1 tablet by mouth once a day 3)  Geritol Complete Tabs (Iron-vitamins) .... Take 1 tablet by mouth once a day 4)  Vitamin E Complex 1000 Unit Caps (Vitamin e) .... Take 1 tablet by mouth once a day 5)  Klonopin 0.5 Mg Tabs (Clonazepam) .... Take 1 tablet by mouth three times a day 6)  Citalopram Hydrobromide 10 Mg Tabs (Citalopram hydrobromide) .... Take 1 tablet by mouth once a day 7)  Temazepam 15 Mg Caps (Temazepam) .... Take 1 capsule by mouth at bedtime 8)  Azor 10-40 Mg Tabs (Amlodipine-olmesartan) .... Take 1 tablet by  mouth once a day  Other Orders: Influenza Vaccine MCR (29562)  Patient Instructions: 1)  Please schedule a follow-up appointment in 2 months. 2)  i advise you to get the colonscopy since due. 3)  bP is 140/82 today, good. 4)  your nerves ad sleep are better, pls see the therapist regularly 5)  Meds a s listed Prescriptions: AZOR 10-40 MG TABS (AMLODIPINE-OLMESARTAN) Take 1 tablet by mouth once a day  #90 x 3   Entered and Authorized by:   Syliva Overman MD   Signed by:   Syliva Overman MD on 02/13/2010   Method used:   Printed then faxed to ...       [facility] (mail-order)             , [place]         Ph: [phone]       Fax: [phone]   RxID:   [id number] TEMAZEPAM 15 MG CAPS (  TEMAZEPAM) Take 1 capsule by mouth at bedtime  #90 x 1   Entered and Authorized by:   Syliva Overman MD   Signed by:   Syliva Overman MD on 02/13/2010   Method used:   Printed then faxed to ...       [facility] (mail-order)             , [place]         Ph: [phone]       Fax: [phone]   RxID:   [id number] CITALOPRAM HYDROBROMIDE 10 MG TABS (CITALOPRAM HYDROBROMIDE) Take 1 tablet by mouth once a day  #90 x 1   Entered and Authorized by:   Syliva Overman MD   Signed by:   Syliva Overman MD on 02/13/2010   Method used:   Printed then faxed to ...       [facility] (mail-order)             , [place]         Ph: [phone]       Fax: [phone]   RxID:   [id number]    Immunizations Administered:  Influenza Vaccine # 1:    Vaccine Type: Fluvax MCR    Site: left deltoid    Mfr: novartis    Dose: 0.5 ml    Route: IM    Given by: Mauricia Area    Exp. Date: 09/2010    Lot #: [id number]    VIS given: 12/04/09 version given February 13, 2010.

## 2010-06-11 NOTE — Letter (Signed)
Summary: Letter  Letter   Imported By: Lind Guest 01/08/2010 11:42:11  _____________________________________________________________________  External Attachment:    Type:   Image     Comment:   External Document

## 2010-06-11 NOTE — Letter (Signed)
Summary: dose reduction on med  dose reduction on med   Imported By: Lind Guest 04/18/2010 12:46:01  _____________________________________________________________________  External Attachment:    Type:   Image     Comment:   External Document

## 2010-06-11 NOTE — Letter (Signed)
Summary: Recall, Screening Colonoscopy Only  [facility]  [address]   [place], [place] [address]   Phone: [phone]  Fax: [phone]    January 17, 2010  SHAKESHA SOLTAU [address] [place], [place]  [address] 1928/09/23   Dear Ms. Morford,   Our records indicate it is time to schedule your colonoscopy.    Please call our office at [phone] and ask for the nurse.   Thank you,    Hendricks Limes, LPN Cloria Spring, LPN  [facility] Ph: [phone]   Fax: [phone]

## 2010-06-11 NOTE — Letter (Signed)
Summary: Letter  Letter   Imported By: Lind Guest 01/25/2010 16:51:30  _____________________________________________________________________  External Attachment:    Type:   Image     Comment:   External Document

## 2010-06-11 NOTE — Op Note (Signed)
Summary: colonoscopy snare polypectomy biopsy  colonoscopy snare polypectomy biopsy   Imported By: Lind Guest 01/09/2010 08:24:23  _____________________________________________________________________  External Attachment:    Type:   Image     Comment:   External Document

## 2010-06-13 NOTE — Progress Notes (Signed)
Summary: question about medications  ---- Converted from flag --   ---- 05/15/2010 12:19 PM, Jonathon Bellows MD, Caleen Essex wrote: Has the Lanetta Inch helped? If diarrhea is better, I would attribute it to questran more thatnprobiotic; if diarrhea better, would continue questran and taper off probiotic  ---- 05/15/2010 9:53 AM, Hendricks Limes LPN wrote: Lanetta Inch  ---- 05/15/2010 7:59 AM, Jonathon Bellows MD, Caleen Essex wrote: i need to know what "powder" we are talking about ------------------------------  Appended Document: question about medications pt aware, she stated the diarrhea is better

## 2010-06-13 NOTE — Assessment & Plan Note (Signed)
Summary: F/U HX OF DIARRHEA AND POLYPS/LAW   Visit Type:  Follow-up Visit Primary Care Provider:  Lodema Hong  Chief Complaint:  F/U diarrhea.  History of Present Illness: 77 year old lady with alternating  diarrhea and relatively normal bowel function. Has diarrhea several times a month. No blood per rectum. At other times bowel function relatively normal.  recent set of stool studies and ileocolonoscopy biopsy failed to demonstrate a cause for diarrhea. She did have a hyperplastic polyp. She does take one Imodium each morning which does show tend to slow her stools down somewhat.     Preventive Screening-Counseling & Management  Alcohol-Tobacco     Smoking Status: quit  Caffeine-Diet-Exercise     Does Patient Exercise: yes     Times/week: 3  Current Medications (verified): 1)  Bystolic 10 Mg Tabs (Nebivolol Hcl) .... Take 1 Tablet By Mouth Once A Day 2)  Cetirizine Hcl 10 Mg Tabs (Cetirizine Hcl) .... Take 1 Tablet By Mouth Once A Day 3)  Geritol Complete  Tabs (Iron-Vitamins) .... Take 1 Tablet By Mouth Once A Day 4)  Vitamin E Complex 1000 Unit Caps (Vitamin E) .... Take 1 Tablet By Mouth Once A Day 5)  Klonopin 0.5 Mg Tabs (Clonazepam) .... Take 2 Tablets Once Daily 6)  Citalopram Hydrobromide 10 Mg Tabs (Citalopram Hydrobromide) .... Take 1 Tablet By Mouth Once A Day 7)  Temazepam 15 Mg Caps (Temazepam) .... Take 1 Capsule By Mouth At Bedtime 8)  Azor 10-40 Mg Tabs (Amlodipine-Olmesartan) .... Take 1 Tablet By Mouth Once A Day 9)  Align Probiotic .... One Tablet Daily 10)  Immodium .... As Needed  Allergies (verified): 1)  ! Penicillin 2)  ! * Exforge  Past History:  Family History: Last updated: 01/07/2010 Family History of Diabetes Family History of Arthritis Hx, family, kidney disease NEC  Mother-deceased-HTN  Father-deceased-unknown 2 brothers-deceased-one had been on dialysis 2 sisters with Diabetes Sister-colon cancer  Social History: Last updated:  01/07/2010 Retired Widow since 08/2009 after 52 yrs of marriage quit smoking in 2009 Alcohol use-no Drug use-no Regular exercise-yes sometimes  Risk Factors: Exercise: yes (04/17/2010)  Risk Factors: Smoking Status: quit (04/17/2010)  Past Medical History: Hypertension x 20 years Chronic diarrhea Anxiety/Depression Hx of kidney stone  (crushed)  Past Surgical History: Stomach ulcer (had some of stomach removed) x 50 years ago Cholecystectomy x 30 years ago Lumpectomy left breast     Bunions removed from both feet  Social History: Does Patient Exercise:  yes Smoking Status:  quit  Vital Signs:  Patient profile:   77 year old female Menstrual status:  hysterectomy Height:      63.5 inches Weight:      168 pounds BMI:     29.40 Temp:     97.7 degrees F oral Pulse rate:   68 / minute BP sitting:   152 / 82  (right arm) Cuff size:   large  Vitals Entered By: Cloria Spring LPN (April 17, 2010 10:52 AM)  Physical Exam  General:  pleasant elderly lady resting comfortably.  Detailed exam deferred.  Impression & Recommendations: Impression: Intermittent diarrhea punctuated in relatively normal bowel function.  Most likely irritable bowel syndrome with negative workup thus far. Although a long shot, she should be screening for celiac disease.  Recommendations: Questran 2 g orally daily not be taken within 2 hours of other medications. Hopefully, this will normalize bowel function. Reviewed  the off label nature of this approach. May use Imodium p.r.n. breakthrough symptoms.  Celiac panel and serum IgA level today  Further recommendations to follow.  Other Orders: T-Celiac Disease Ab Evaluation (8002) T-igA (21308)  Appended Document: Orders Update    Clinical Lists Changes  Orders: Added new Service order of Est. Patient Level III (65784) - Signed

## 2010-06-13 NOTE — Progress Notes (Signed)
  Phone Note Call from Patient   Reason for Call: Refill Medication, Talk to Nurse Summary of Call: pt called about wheather or not if she is to continue taking both medicines (Align and Powder). Please call pt back at [phone] Initial call taken by: Diana Eves,  May 14, 2010 2:52 PM

## 2010-06-13 NOTE — Assessment & Plan Note (Signed)
Summary: F UP   Vital Signs:  Patient profile:   77 year old female Menstrual status:  hysterectomy Height:      63.5 inches Weight:      168.75 pounds BMI:     29.53 O2 Sat:      99 % on Room air Pulse rate:   53 / minute Pulse rhythm:   regular Resp:     16 per minute BP sitting:   160 / 80  (left arm)  Vitals Entered By: Adella Hare LPN (April 18, 2010 8:18 AM)  Nutrition Counseling: Patient's BMI is greater than 25 and therefore counseled on weight management options.  O2 Flow:  Room air CC: follow-up visit Is Patient Diabetic? No Pain Assessment Patient in pain? no        CC:  follow-up visit.  History of Present Illness: Reports  thatshe is doing much better Denies recent fever or chills. Denies sinus pressure, nasal congestion , ear pain or sore throat. Denies chest congestion, or cough productive of sputum. Denies chest pain, palpitations, PND, orthopnea or leg swelling. Denies abdominal pain, nausea, vomitting, diarrhea or constipation. Denies change in bowel movements or bloody stool. Denies dysuria , frequency, incontinence or hesitancy. Denies  joint pain, swelling, or reduced mobility. Denies headaches, vertigo, seizures.  Denies  rash, lesions, or itch.     Allergies (verified): 1)  ! Penicillin 2)  ! * Exforge  Review of Systems      See HPI General:  Complains of fatigue. Eyes:  Denies blurring and discharge. GI:  Complains of diarrhea; saw GI yesterday on align one daily and questran 2 gm daily for diarreah avg 6 to 7 per day, with immodium as needed to help, this prob is 77 years old. Psych:  Complains of anxiety and depression; denies mental problems, suicidal thoughts/plans, thoughts of violence, and unusual visions or sounds; improved. Endo:  Denies cold intolerance, excessive hunger, excessive thirst, and excessive urination. Heme:  Denies abnormal bruising and bleeding. Allergy:  Denies hives or rash and itching eyes.  Physical  Exam  General:  Well-developed,well-nourished,in no acute distress; alert,appropriate and cooperative throughout examination HEENT: No facial asymmetry,  EOMI, No sinus tenderness, TM's Clear, oropharynx  pink and moist.   Chest: Clear to auscultation bilaterally.  CVS: S1, S2, No murmurs, No S3.   Abd: Soft, Nontender.  MS: Adequate ROM spine, hips, shoulders and knees.  Ext: No edema.   CNS: CN 2-12 intact, power tone and sensation normal throughout.   Skin: Intact, no visible lesions or rashes.  Psych: Good eye contact, normal affect.  Memory intact,mildly anxious not depressed appearing.    Impression & Recommendations:  Problem # 1:  OVERWEIGHT (ICD-278.02) Assessment Unchanged  Orders: Medicare Electronic Prescription [phone])  Ht: 63.5 (04/18/2010)   Wt: 168.75 (04/18/2010)   BMI: 29.53 (04/18/2010) therapeutic lifestyle change discussed and encouraged  Problem # 2:  ESSENTIAL HYPERTENSION (ICD-401.9) Assessment: Deteriorated  Her updated medication list for this problem includes:    Bystolic 10 Mg Tabs (Nebivolol hcl) .Marland Kitchen... Take 1 tablet by mouth once a day    Azor 10-40 Mg Tabs (Amlodipine-olmesartan) .Marland Kitchen... Take 1 tablet by mouth once a day    Clonidine Hcl 0.1 Mg Tabs (Clonidine hcl) .Marland Kitchen... Take 1 tab by mouth at bedtime  Orders: Medicare Electronic Prescription [phone])  BP today: 160/80 Prior BP: 140/80 (02/13/2010)  Labs Reviewed: K+: 4.2 (01/23/2010) Creat: : 0.77 (01/23/2010)   Chol: 221 (01/23/2010)   HDL: 70 (01/23/2010)  LDL: 134 (01/23/2010)   TG: 83 (01/23/2010)  Problem # 3:  MOURNING (ICD-309.0) Assessment: Improved pt has had therapy session which helped  Problem # 4:  DEPRESSION (ICD-311) Assessment: Improved  The following medications were removed from the medication list:    Klonopin 0.5 Mg Tabs (Clonazepam) .Marland Kitchen... Take 2 tablets once daily Her updated medication list for this problem includes:    Citalopram Hydrobromide 10 Mg Tabs  (Citalopram hydrobromide) .Marland Kitchen... Take 1 tablet by mouth once a day    Clonazepam 0.5 Mg Tabs (Clonazepam) .Marland Kitchen... Take 1 tablet by mouth two times a day  Orders: Medicare Electronic Prescription ([phone])  Complete Medication List: 1)  Bystolic 10 Mg Tabs (Nebivolol hcl) .... Take 1 tablet by mouth once a day 2)  Cetirizine Hcl 10 Mg Tabs (Cetirizine hcl) .... Take 1 tablet by mouth once a day 3)  Geritol Complete Tabs (Iron-vitamins) .... Take 1 tablet by mouth once a day 4)  Vitamin E Complex 1000 Unit Caps (Vitamin e) .... Take 1 tablet by mouth once a day 5)  Citalopram Hydrobromide 10 Mg Tabs (Citalopram hydrobromide) .... Take 1 tablet by mouth once a day 6)  Azor 10-40 Mg Tabs (Amlodipine-olmesartan) .... Take 1 tablet by mouth once a day 7)  Align Probiotic  .... One tablet daily 8)  Immodium  .... As needed 9)  Clonazepam 0.5 Mg Tabs (Clonazepam) .... Take 1 tablet by mouth two times a day 10)  Clonidine Hcl 0.1 Mg Tabs (Clonidine hcl) .... Take 1 tab by mouth at bedtime 11)  Multivitamins Tabs (Multiple vitamin) .... Take 1 tablet by mouth once a day  Patient Instructions: 1)  Please schedule a follow-up appointment in 2 months. 2)  I am discontinuing temazepam at [facility]. Use the remaining capsules only as needed, maybe one or 2 per week till dONe.Call if you need a refill. 3)  I am reducing the clonazepam to twice daily. 4)  Pls start one multivit daily, no need to refill the vit E 5)  Additional med for your BP is sent in Prescriptions: MULTIVITAMINS  TABS (MULTIPLE VITAMIN) Take 1 tablet by mouth once a day  #90 x 3   Entered and Authorized by:   Syliva Overman MD   Signed by:   Syliva Overman MD on 04/18/2010   Method used:   Printed then faxed to ...       [facility] (mail-[facility])             , [place]         Ph: [phone]       Fax: [phone]   RxID:   [id number] CLONIDINE HCL 0.1 MG TABS (CLONIDINE HCL) Take 1 tab by mouth at bedtime  #90 x 1   Entered and  Authorized by:   Syliva Overman MD   Signed by:   Syliva Overman MD on 04/18/2010   Method used:   Printed then faxed to ...       [facility] (mail-order)             , [place]         Ph: [phone]       Fax: [phone]   RxID:   [id number] MULTIVITAMINS  TABS (MULTIPLE VITAMIN) Take 1 tablet by mouth once a day  #90 x 3   Entered and Authorized by:   Syliva Overman MD   Signed by:   Syliva Overman MD on 04/18/2010   Method used:   Printed then  faxed to ...       [facility] (mail-order)             , [place]         Ph: [phone]       Fax: [phone]   RxID:   [id number] CLONAZEPAM 0.5 MG TABS (CLONAZEPAM) Take 1 tablet by mouth two times a day  #60 x 3   Entered and Authorized by:   Syliva Overman MD   Signed by:   Syliva Overman MD on 04/18/2010   Method used:   Printed then faxed to ...       [facility]  [address] [phone]* (retail)       [address]       [place], [place]  [address]       Ph: [phone] or [phone]       Fax: [phone]   RxID:   [id number]    Orders Added: 1)  Est. Patient Level IV [01601] 2)  Medicare Electronic Prescription [phone]

## 2010-06-14 ENCOUNTER — Telehealth (INDEPENDENT_AMBULATORY_CARE_PROVIDER_SITE_OTHER): Payer: Self-pay

## 2010-06-19 NOTE — Progress Notes (Addendum)
Summary: diarrhea  Phone Note Call from Patient Call back at Home Phone [phone]   Caller: Patient Summary of Call: Pt called- She has started having 3-4 episodes of diarrhea a day again. she stated the Cholestramine and align were working great and now it seems like its not working anymore. pt said she has not changed any of her medications and wants to know what she should do about it. please advise. Initial call taken by: Hendricks Limes LPN,  June 14, 2010 8:59 AM     Appended Document: diarrhea pt should be taking 2 grams of Questran daily; would inc to 4 grams daily and keep everything else the same.  Appended Document: diarrhea tried to call pt- NA  Appended Document: diarrhea pt aware

## 2010-06-25 ENCOUNTER — Encounter: Payer: Self-pay | Admitting: Family Medicine

## 2010-06-25 ENCOUNTER — Ambulatory Visit (INDEPENDENT_AMBULATORY_CARE_PROVIDER_SITE_OTHER): Payer: Medicare Other | Admitting: Family Medicine

## 2010-06-25 DIAGNOSIS — F329 Major depressive disorder, single episode, unspecified: Secondary | ICD-10-CM

## 2010-06-25 DIAGNOSIS — R259 Unspecified abnormal involuntary movements: Secondary | ICD-10-CM | POA: Insufficient documentation

## 2010-06-25 DIAGNOSIS — I10 Essential (primary) hypertension: Secondary | ICD-10-CM

## 2010-06-25 DIAGNOSIS — H9209 Otalgia, unspecified ear: Secondary | ICD-10-CM

## 2010-06-25 DIAGNOSIS — F3289 Other specified depressive episodes: Secondary | ICD-10-CM

## 2010-06-30 DIAGNOSIS — J301 Allergic rhinitis due to pollen: Secondary | ICD-10-CM | POA: Insufficient documentation

## 2010-07-08 ENCOUNTER — Ambulatory Visit (HOSPITAL_COMMUNITY)
Admission: RE | Admit: 2010-07-08 | Discharge: 2010-07-08 | Disposition: A | Discharge status: Home / Self Care | Payer: Medicare Other | Source: Ambulatory Visit | Attending: Family Medicine | Admitting: Family Medicine

## 2010-07-08 DIAGNOSIS — Z139 Encounter for screening, unspecified: Secondary | ICD-10-CM

## 2010-07-08 DIAGNOSIS — Z1231 Encounter for screening mammogram for malignant neoplasm of breast: Secondary | ICD-10-CM | POA: Insufficient documentation

## 2010-07-09 ENCOUNTER — Telehealth: Payer: Self-pay | Admitting: Family Medicine

## 2010-07-09 ENCOUNTER — Encounter: Payer: Self-pay | Admitting: Family Medicine

## 2010-07-09 NOTE — Assessment & Plan Note (Signed)
Summary: follow up   Vital Signs:  Patient profile:   77 year old female Menstrual status:  hysterectomy Height:      63.5 inches Weight:      170 pounds BMI:     29.75 O2 Sat:      97 % Pulse rate:   80 / minute Pulse rhythm:   regular Resp:     16 per minute BP sitting:   152 / 84  (left arm) Cuff size:   large  Vitals Entered By: Everitt Amber LPN (June 25, 2010 9:53 AM)  Nutrition Counseling: Patient's BMI is greater than 25 and therefore counseled on weight management options. CC: Follow up chronic problems, c/o left ear ache 3-4 times a week x 1 year off and on   Primary Care Provider:  Lodema Hong  CC:  Follow up chronic problems and c/o left ear ache 3-4 times a week x 1 year off and on.  History of Present Illness: Reports  that she is continuing to improve She has voluntarily discontinued her sleeping meds, not much need, and her grieving is lessening Denies recent fever or chills. Denies sinus pressure, nasal congestion , or sore throat.chronic and worsening left ear pain. Denies chest congestion, or cough productive of sputum. Denies chest pain, palpitations, PND, orthopnea or leg swelling.  Denies change in bowel movements or bloody stool. Denies dysuria , frequency, incontinence or hesitancy. Denies  joint pain, swelling, or reduced mobility. Denies headaches, vertigo, seizures. Denies depression, anxiety or insomnia. Denies  rash, lesions, or itch.     Current Medications (verified): 1)  Bystolic 10 Mg Tabs (Nebivolol Hcl) .... Take 1 Tablet By Mouth Once A Day 2)  Cetirizine Hcl 10 Mg Tabs (Cetirizine Hcl) .... Take 1 Tablet By Mouth Once A Day 3)  Geritol Complete  Tabs (Iron-Vitamins) .... Take 1 Tablet By Mouth Once A Day 4)  Azor 10-40 Mg Tabs (Amlodipine-Olmesartan) .... Take 1 Tablet By Mouth Once A Day 5)  Align Probiotic .... One Tablet Daily 6)  Immodium .... As Needed 7)  Clonazepam 0.5 Mg Tabs (Clonazepam) .... Take 1 Tablet By Mouth Two  Times A Day 8)  Aspir-Low 81 Mg Tbec (Aspirin) .... Take 1 Tablet By Mouth Once A Day 9)  Prevalite 4 Gm/dose Powd (Cholestyramine Light) .... Mix Two Scoops Daily  Allergies (verified): 1)  ! Penicillin 2)  ! * Exforge  Past History:  Past medical, surgical, family and social histories (including risk factors) reviewed, and no changes noted (except as noted below).  Past Medical History: Reviewed history from 04/17/2010 and no changes required. Hypertension x 20 years Chronic diarrhea Anxiety/Depression Hx of kidney stone  (crushed)  Past Surgical History: Stomach ulcer (had some of stomach removed) x 50 years ago Cholecystectomy x 30 years ago Lumpectomy left breast     Bunions removed from both feet left shoulder surgery, Drharrison approx 2009  Family History: Reviewed history from 01/07/2010 and no changes required. Family History of Diabetes Family History of Arthritis Hx, family, kidney disease NEC  Mother-deceased-HTN  Father-deceased-unknown 2 brothers-deceased-one had been on dialysis 2 sisters with Diabetes Sister-colon cancer  Social History: Reviewed history from 01/07/2010 and no changes required. Retired Widow since 08/2009 after 52 yrs of marriage quit smoking in 2009 Alcohol use-no Drug use-no Regular exercise-yes sometimes  Review of Systems      See HPI General:  Complains of sleep disorder; improved. Eyes:  Denies discharge, eye pain, and red eye. ENT:  Complains of  earache; 9 month h/o intermittent left ear discomfort, increasing in frequency and severity, on avg 4 days per week up to an 8, relieved qwith alleve. GI:  Complains of diarrhea; iBS diarreah predominant, better with align and 1 scoop of fiber down to 6 per day from 12. MS:  Complains of joint pain, low back pain, mid back pain, and stiffness. Neuro:  Complains of tremors; marked intentional tremor, occasional twitches . Psych:  Complains of anxiety;  . Heme:  Denies abnormal  bruising and bleeding. Allergy:  Denies hives or rash and itching eyes.  Physical Exam  General:  Well-developed,well-nourished,in no acute distress; alert,appropriate and cooperative throughout examination HEENT: No facial asymmetry,  EOMI, No sinus tenderness, TM's Clear, oropharynx  pink and moist.   Chest: Clear to auscultation bilaterally.  CVS: S1, S2, No murmurs, No S3.   Abd: Soft, Nontender.  MS: decreased  ROM spine, hips, shoulders and knees.  Ext: No edema.   CNS: CN 2-12 intact, power tone and sensation normal throughout. resting and intentional tremor noted, no cogwheeling  Skin: Intact, no visible lesions or rashes.  Psych: Good eye contact, normal affect.  Memory intact, not anxious or depressed appearing.    Impression & Recommendations:  Problem # 1:  TREMOR (ICD-781.0) Assessment Unchanged  Orders: Neurology Referral (Neuro) Medicare Electronic Prescription [phone])  Problem # 2:  EAR PAIN, LEFT (ICD-388.70) Assessment: Deteriorated  Future Orders: ENT Referral (ENT) ... 06/26/2010  Problem # 3:  ESSENTIAL HYPERTENSION (ICD-401.9) Assessment: Unchanged  The following medications were removed from the medication list:    Clonidine Hcl 0.1 Mg Tabs (Clonidine hcl) .Marland Kitchen... Take 1 tab by mouth at bedtime Her updated medication list for this problem includes:    Bystolic 10 Mg Tabs (Nebivolol hcl) .Marland Kitchen... Take 1 tablet by mouth once a day    Azor 10-40 Mg Tabs (Amlodipine-olmesartan) .Marland Kitchen... Take 1 tablet by mouth once a day    Metoprolol Succinate 25 Mg Xr24h-tab (Metoprolol succinate) .Marland Kitchen... Take 1 tablet by mouth once a day  Orders: T-Basic Metabolic Panel ([id number])  BP today: 152/84 Prior BP: 160/80 (04/18/2010)  Labs Reviewed: K+: 4.2 (01/23/2010) Creat: : 0.77 (01/23/2010)   Chol: 221 (01/23/2010)   HDL: 70 (01/23/2010)   LDL: 134 (01/23/2010)   TG: 83 (01/23/2010)  Problem # 4:  DEPRESSION (ICD-311) Assessment: Improved  The following  medications were removed from the medication list:    Citalopram Hydrobromide 10 Mg Tabs (Citalopram hydrobromide) .Marland Kitchen... Take 1 tablet by mouth once a day Her updated medication list for this problem includes:    Clonazepam 0.5 Mg Tabs (Clonazepam) .Marland Kitchen... Take 1 tablet by mouth two times a day  Problem # 5:  DIARRHEA (ICD-787.91) Assessment: Improved iBS with diarreah predomnance, improved on current regime, recently saw gI  Problem # 6:  ALLERGIC RHINITIS, SEASONAL (ICD-477.0) Assessment: Improved continue certrizine  Complete Medication List: 1)  Bystolic 10 Mg Tabs (Nebivolol hcl) .... Take 1 tablet by mouth once a day 2)  Cetirizine Hcl 10 Mg Tabs (Cetirizine hcl) .... Take 1 tablet by mouth once a day 3)  Geritol Complete Tabs (Iron-vitamins) .... Take 1 tablet by mouth once a day 4)  Azor 10-40 Mg Tabs (Amlodipine-olmesartan) .... Take 1 tablet by mouth once a day 5)  Align Probiotic  .... One tablet daily 6)  Immodium  .... As needed 7)  Clonazepam 0.5 Mg Tabs (Clonazepam) .... Take 1 tablet by mouth two times a day 8)  Aspir-low 81 Mg Tbec (Aspirin) .Marland KitchenMarland KitchenMarland Kitchen  Take 1 tablet by mouth once a day 9)  Prevalite 4 Gm/dose Powd (Cholestyramine light) .... Mix two scoops daily 10)  Metoprolol Succinate 25 Mg Xr24h-tab (Metoprolol succinate) .... Take 1 tablet by mouth once a day  Other Orders: T-TSH ([id number])  Patient Instructions: 1)  cPE in 3.5 months. 2)  you are referred to neurology about the tremor, Dr Mikle Bosworth, and also to dr Danice Goltz about your left ear pain. pls use advil as needed for pain until you see him. 3)  New med for tremor and blood pressure, pls take every day 4)  TSH prior to visit, ICD-9: 5)  BMP prior to visit, ICD-9:   fasting in 3.5 months 6)  good that you stopped the sleeping med, use only if you need it Prescriptions: METOPROLOL SUCCINATE 25 MG XR24H-TAB (METOPROLOL SUCCINATE) Take 1 tablet by mouth once a day  #90 x 1   Entered by:   Everitt Amber LPN    Authorized by:   Syliva Overman MD   Signed by:   Everitt Amber LPN on 16/60/6301   Method used:   Printed then faxed to ...       [facility] (mail-order)             , [place]         Ph: [phone]       Fax: [phone]   RxID:   [id number]    Orders Added: 1)  Est. Patient Level IV [07371] 2)  T-Basic Metabolic Panel [[id number]] 3)  T-TSH [[id number]] 4)  Neurology Referral [Neuro] 5)  ENT Referral [ENT] 6)  Medicare Electronic Prescription [phone]

## 2010-07-17 ENCOUNTER — Encounter: Payer: Self-pay | Admitting: Family Medicine

## 2010-07-18 NOTE — Letter (Signed)
Summary: [facility]  [facility]   Imported By: Lind Guest 07/11/2010 11:20:44  _____________________________________________________________________  External Attachment:    Type:   Image     Comment:   External Document

## 2010-07-18 NOTE — Progress Notes (Signed)
  Phone Note Other Incoming   Caller: dr Akira Perusse` Summary of Call: dr Gerilyn Pilgrim calledstating pt has neither parkinson's or benign tremor, feels mainly anxiety related, toprol reportedly did not help he symptoms, and represents a duplication in class so willd/c the toprol and resume previous bp med Initial call taken by: Syliva Overman MD,  July 09, 2010 1:30 PM  Follow-up for Phone Call        pls call pt and let herknow I spoke wioth Dr Gerilyn Pilgrim, she ios to stop the toprol, and just continue on the bystolic and azopr as before. Pls send a d/c noter to pharmacy also stamped.  He aloss said she reported light headedness when she bends over, so pls let her know i advise an [place] of her neck arteries to evakluate the circulation to her brain, let me know if she agrees so I can order Follow-up by: Syliva Overman MD,  July 09, 2010 1:32 PM  Additional Follow-up for Phone Call Additional follow up Details #1::        called patient, left message Additional Follow-up by: Everitt Amber LPN,  July 09, 2010 1:46 PM    Additional Follow-up for Phone Call Additional follow up Details #2::    Patient aware and will stop Toprol and sent d/c order to [facility] Follow-up by: Everitt Amber LPN,  July 10, 2010 10:45 AM

## 2010-07-18 NOTE — Letter (Signed)
[facility]   Imported By: Lind Guest 07/10/2010 10:49:58  _____________________________________________________________________  External Attachment:    Type:   Image     Comment:   External Document

## 2010-07-23 ENCOUNTER — Encounter: Payer: Self-pay | Admitting: Gastroenterology

## 2010-07-23 LAB — FECAL LACTOFERRIN, QUANT: Fecal Lactoferrin: NEGATIVE

## 2010-07-23 LAB — STOOL CULTURE

## 2010-07-23 LAB — OVA AND PARASITE EXAMINATION: Ova and parasites: NONE SEEN

## 2010-07-24 ENCOUNTER — Encounter (INDEPENDENT_AMBULATORY_CARE_PROVIDER_SITE_OTHER): Payer: Self-pay | Admitting: *Deleted

## 2010-07-30 NOTE — Letter (Signed)
Summary: Recall Office Visit  [facility]  [address]   [place], [place] [address]   Phone: [phone]  Fax: [phone]      July 24, 2010   JAQUITA BESSIRE [address] [place], [place]  [address] 1929-05-04   Dear Ms. Lucus,   According to our records, it is time for you to schedule a follow-up office visit with [place].   At your convenience, please call [phone] to schedule an office visit. If you have any questions, concerns, or feel that this letter is in error, we would appreciate your call.   Sincerely,    Diana Eves  [facility] Ph: [phone]   Fax: [phone]

## 2010-07-30 NOTE — Letter (Signed)
Summary: enleft otalgia  enleft otalgia   Imported By: Lind Guest 07/23/2010 14:42:55  _____________________________________________________________________  External Attachment:    Type:   Image     Comment:   External Document

## 2010-07-30 NOTE — Letter (Signed)
Summary: ent  ent   Imported By: Lind Guest 07/23/2010 14:42:17  _____________________________________________________________________  External Attachment:    Type:   Image     Comment:   External Document

## 2010-07-30 NOTE — Letter (Signed)
[facility]: [facility]   Imported By: Rexene Alberts 07/23/2010 10:50:17  _____________________________________________________________________  External Attachment:    Type:   Image     Comment:   External Document

## 2010-08-09 ENCOUNTER — Encounter: Payer: Self-pay | Admitting: Gastroenterology

## 2010-08-12 ENCOUNTER — Ambulatory Visit: Payer: Medicare Other | Admitting: Gastroenterology

## 2010-08-16 LAB — CBC
HCT: 42.9 % (ref 36.0–46.0)
Hemoglobin: 14.8 g/dL (ref 12.0–15.0)
MCV: 86.4 fL (ref 78.0–100.0)
Platelets: 183 10*3/uL (ref 150–400)
RBC: 4.97 MIL/uL (ref 3.87–5.11)

## 2010-08-16 LAB — BASIC METABOLIC PANEL
Chloride: 105 mEq/L (ref 96–112)
Creatinine, Ser: 1.13 mg/dL (ref 0.4–1.2)
GFR calc Af Amer: 56 mL/min — ABNORMAL LOW (ref >60)
Potassium: 3.5 mEq/L (ref 3.5–5.1)
Sodium: 139 mEq/L (ref 135–145)

## 2010-08-16 LAB — DIFFERENTIAL
Eosinophils Absolute: 0.1 10*3/uL (ref 0.0–0.7)
Neutro Abs: 4.4 10*3/uL (ref 1.7–7.7)
Neutrophils Relative %: 70 % (ref 43–77)

## 2010-08-16 LAB — SEDIMENTATION RATE: Sed Rate: 7 mm/hr (ref 0–22)

## 2010-08-19 ENCOUNTER — Ambulatory Visit (INDEPENDENT_AMBULATORY_CARE_PROVIDER_SITE_OTHER): Payer: Medicare Other | Admitting: Family Medicine

## 2010-08-19 ENCOUNTER — Encounter: Payer: Self-pay | Admitting: Family Medicine

## 2010-08-19 VITALS — BP 148/88 | HR 58 | Resp 16 | Ht 63.0 in | Wt 172.4 lb

## 2010-08-19 DIAGNOSIS — I10 Essential (primary) hypertension: Secondary | ICD-10-CM

## 2010-08-19 DIAGNOSIS — K219 Gastro-esophageal reflux disease without esophagitis: Secondary | ICD-10-CM

## 2010-08-19 DIAGNOSIS — B370 Candidal stomatitis: Secondary | ICD-10-CM

## 2010-08-19 MED ORDER — HYDROCHLOROTHIAZIDE 12.5 MG PO CAPS: 12.5000 mg | ORAL_CAPSULE | Freq: Every day | ORAL | Status: DC

## 2010-08-19 MED ORDER — FLUCONAZOLE 150 MG PO TABS: 150.0000 mg | ORAL_TABLET | Freq: Once | ORAL | Status: AC

## 2010-08-19 NOTE — Patient Instructions (Signed)
F/u in 2 months.  New additional medication for your bloodpressure is HCTZ take one every morning.  Med is sent in for coated tongue.  GI will help with your food sticking, pls keep your appt in the morning

## 2010-08-20 ENCOUNTER — Ambulatory Visit (INDEPENDENT_AMBULATORY_CARE_PROVIDER_SITE_OTHER): Payer: Medicare Other | Admitting: Gastroenterology

## 2010-08-20 ENCOUNTER — Encounter: Payer: Self-pay | Admitting: Gastroenterology

## 2010-08-20 VITALS — BP 160/77 | HR 60 | Temp 98.3°F | Ht 63.0 in | Wt 172.0 lb

## 2010-08-20 DIAGNOSIS — R1314 Dysphagia, pharyngoesophageal phase: Secondary | ICD-10-CM

## 2010-08-20 DIAGNOSIS — K219 Gastro-esophageal reflux disease without esophagitis: Secondary | ICD-10-CM | POA: Insufficient documentation

## 2010-08-20 DIAGNOSIS — R1310 Dysphagia, unspecified: Secondary | ICD-10-CM

## 2010-08-20 DIAGNOSIS — R1319 Other dysphagia: Secondary | ICD-10-CM | POA: Insufficient documentation

## 2010-08-20 NOTE — Progress Notes (Signed)
Reviewed by R. Michael Brance Dartt, MD FACP FACG 

## 2010-08-20 NOTE — Progress Notes (Signed)
Primary Care Physician:  Syliva Overman, MD, MD  Primary Gastroenterologist: Dr. Roetta Sessions  Chief Complaint  Patient presents with  . Dysphagia    diarrhea, white patches on tongue that tastes funny per pt, hurts to swallow food    HPI:  [name] is a 77 y.o. female here for further evaluation of painful swallowing. She also has chronic diarrhea well managed on Questran. Odynophagia/dysphagia for past one week. Heartburn constantly. Takes something for hb daily but nothing listed on drug list. For the past one week she's had increasing difficulty swallowing. She notes that food wants to stick. She also has pain when she swallows anything solid. She saw Dr. Syliva Overman yesterday and she was given one dose of Diflucan for oral candidiasis. She notes her mouth feels better today. Denies any vomiting, abdominal pain, weight loss. BM little better. Diarrhea better controlled now on Questran. She actually recently called in stating that the Questran it stopped working and she was increased to 4 g daily. She states that her power runs out she would like to try the by mouth, Colestid. No melena, brbpr.    Current Outpatient Prescriptions  Medication Sig Dispense Refill  . amLODipine-olmesartan (AZOR) 10-40 MG per tablet Take 1 tablet by mouth daily.        Marland Kitchen aspirin 81 MG EC tablet Take 81 mg by mouth daily.        . cetirizine (ZYRTEC) 10 MG tablet Take 10 mg by mouth daily.        . cholestyramine light (PREVALITE) 4 GM/DOSE powder Take by mouth, 4 grams daily.      . citalopram (CELEXA) 10 MG tablet Take 10 mg by mouth daily.        . clonazePAM (KLONOPIN) 0.5 MG tablet Take 0.5 mg by mouth 2 (two) times daily.        . hydrochlorothiazide (MICROZIDE) 12.5 MG capsule Take 1 capsule (12.5 mg total) by mouth daily.  30 capsule  3  . Iron-Vitamins (GERITOL COMPLETE) TABS Take by mouth.        . Multiple Vitamin (MULTIVITAMIN PO) Take by mouth daily.        . nebivolol (BYSTOLIC) 10  MG tablet Take 10 mg by mouth daily.        . Probiotic Product (PROBIOTIC PO) Take by mouth.        . vitamin E (VITAMIN E) 1000 UNIT capsule Take 1,000 Units by mouth daily.        . fluconazole (DIFLUCAN) 150 MG tablet Take 1 tablet (150 mg total) by mouth once.  1 tablet  0    Allergies as of 08/20/2010 - Review Complete 08/20/2010  Allergen Reaction Noted  . Amlodipine besylate-valsartan Rash with high dose 01/07/2010  . Penicillins  02/17/2007    Past Medical History  Diagnosis Date  . Hypertension 20 years   . Chronic diarrhea   . Anxiety   . Depression   . Kidney stone     hx/ crushed   . Hx of adenomatous colonic polyps     tubular adenomas, last found in 2008  . GERD (gastroesophageal reflux disease)     Past Surgical History  Procedure Date  . Stomach ulcer 50 years ago     had some of her stomach removed   . Cholecystectomy 30 years ago  . Breast lumpectomy   . Bunion removal     from both feet   . Left shoulder surgery 2009  DR HARRISON  . Colonoscopy 03/19/2010    DR Jena Gauss,, normal TI, pancolonic diverticula, random colon bx neg., hyperplastic polyps removed  . Esophagogastroduodenoscopy 11/22/2003    DR Jena Gauss, erosive RE, Billroth I    Family History  Problem Relation Age of Onset  . Hypertension Mother   . Kidney failure Brother     X1 ON DIALYSIS  . Diabetes Sister   . Cancer Sister     X2  . Hypertension Father     History   Social History  . Marital Status: Widowed    Spouse Name: N/A    Number of Children: 2  . Years of Education: N/A   Occupational History  . retired from [employer]    Social History Main Topics  . Smoking status: Former Smoker -- 1.0 packs/day for 20 years    Types: Cigarettes  . Smokeless tobacco: Never Used  . Alcohol Use: No  . Drug Use: No  . Sexually Active: No   Other Topics Concern  . Not on file   Social History Narrative  . No narrative on file      ROS:  General: Negative for anorexia, weight  loss, fever, chills, fatigue, weakness. Eyes: Negative for vision changes.  ENT: Negative for hoarseness, difficulty swallowing , nasal congestion. CV: Negative for chest pain, angina, palpitations, dyspnea on exertion, peripheral edema.  Respiratory: Negative for dyspnea at rest, dyspnea on exertion, cough, sputum, wheezing.  GI: See history of present illness. GU:  Negative for dysuria, hematuria, urinary incontinence, urinary frequency, nocturnal urination.  MS: Negative for joint pain, low back pain.  Derm: Negative for rash or itching.  Neuro: Negative for weakness, abnormal sensation, seizure, frequent headaches, memory loss, confusion.  Psych: Negative for anxiety, depression, suicidal ideation, hallucinations.  Endo: Negative for unusual weight change.  Heme: Negative for bruising or bleeding. Allergy: Negative for rash or hives.    Physical Examination:  BP 160/77  Pulse 60  Temp 98.3 F (36.8 C)  Ht 5\' 3"  (1.6 m)  Wt 172 lb (78.019 kg)  BMI 30.47 kg/m2  SpO2 98%   General: Well-nourished, well-developed in no acute distress.  Head: Normocephalic, atraumatic.   Eyes: Conjunctiva pink, no icterus. Mouth: Oropharyngeal mucosa moist and pink , no lesions erythema or exudate. Neck: Supple without thyromegaly, masses, or lymphadenopathy.  Lungs: Clear to auscultation bilaterally.  Heart: Regular rate and rhythm, no murmurs rubs or gallops.  Abdomen: Bowel sounds are normal, nontender, nondistended, no hepatosplenomegaly or masses, no abdominal bruits or    hernia , no rebound or guarding.   Extremities: No lower extremity edema.  Neuro: Alert and oriented x 4 , grossly normal neurologically.  Skin: Warm and dry, no rash or jaundice.   Psych: Alert and cooperative, normal mood and affect.

## 2010-08-20 NOTE — Progress Notes (Signed)
Tried to call pt- NA 

## 2010-08-20 NOTE — Progress Notes (Signed)
Pt is taking tums

## 2010-08-20 NOTE — Assessment & Plan Note (Signed)
May be related to cause of her painful swallowing. EGD in the near future. Dilation if needed.

## 2010-08-20 NOTE — Assessment & Plan Note (Addendum)
Refractory GERD symptoms. Patient states she's been taking something in daily but nothing listed on her med list. History of erosive reflux esophagitis back in 2005. Current odynophagia/dysphagia may be due to GERD the recommend EGD for further evaluation. We'll call the patient and verify what she is taking for her reflux and make any changes as needed.

## 2010-08-20 NOTE — Assessment & Plan Note (Signed)
One-week history of odynophagia. Need to rule out Candida esophagitis, erosive/ulcerative reflux esophagitis. EGD in the near future. I have discussed the risks, alternatives, benefits with regards to but not limited to the risk of reaction to medication, bleeding, infection, perforation and the patient is agreeable to proceed. Written consent to be obtained.

## 2010-08-21 ENCOUNTER — Other Ambulatory Visit (INDEPENDENT_AMBULATORY_CARE_PROVIDER_SITE_OTHER): Payer: PRIVATE HEALTH INSURANCE | Admitting: *Deleted

## 2010-08-21 DIAGNOSIS — I10 Essential (primary) hypertension: Secondary | ICD-10-CM

## 2010-08-21 MED ORDER — ESOMEPRAZOLE MAGNESIUM 40 MG PO CPDR: 40.0000 mg | DELAYED_RELEASE_CAPSULE | Freq: Every day | ORAL | Status: DC

## 2010-08-21 MED ORDER — CETIRIZINE HCL 10 MG PO TABS: 10.0000 mg | ORAL_TABLET | Freq: Every day | ORAL | Status: DC

## 2010-08-21 NOTE — Progress Notes (Signed)
Please let patient know that she needs to be on Nexium 40 mg daily. Rx sent to [facility] in [place].

## 2010-08-21 NOTE — Progress Notes (Signed)
Pt aware.

## 2010-08-26 ENCOUNTER — Other Ambulatory Visit: Payer: Self-pay | Admitting: Internal Medicine

## 2010-08-26 ENCOUNTER — Encounter: Payer: Medicare Other | Admitting: Internal Medicine

## 2010-08-26 ENCOUNTER — Ambulatory Visit (HOSPITAL_COMMUNITY)
Admission: RE | Admit: 2010-08-26 | Discharge: 2010-08-26 | Disposition: A | Discharge status: Home / Self Care | Payer: Medicare Other | Source: Ambulatory Visit | Attending: Internal Medicine | Admitting: Internal Medicine

## 2010-08-26 DIAGNOSIS — R1310 Dysphagia, unspecified: Secondary | ICD-10-CM

## 2010-08-26 DIAGNOSIS — Z79899 Other long term (current) drug therapy: Secondary | ICD-10-CM | POA: Insufficient documentation

## 2010-08-26 DIAGNOSIS — K2100 Gastro-esophageal reflux disease with esophagitis, without bleeding: Secondary | ICD-10-CM

## 2010-08-26 DIAGNOSIS — K449 Diaphragmatic hernia without obstruction or gangrene: Secondary | ICD-10-CM | POA: Insufficient documentation

## 2010-08-26 DIAGNOSIS — K222 Esophageal obstruction: Secondary | ICD-10-CM

## 2010-08-26 DIAGNOSIS — K2960 Other gastritis without bleeding: Secondary | ICD-10-CM

## 2010-08-26 DIAGNOSIS — E785 Hyperlipidemia, unspecified: Secondary | ICD-10-CM | POA: Insufficient documentation

## 2010-08-26 DIAGNOSIS — I10 Essential (primary) hypertension: Secondary | ICD-10-CM | POA: Insufficient documentation

## 2010-08-26 HISTORY — PX: ESOPHAGOGASTRODUODENOSCOPY: SHX1529

## 2010-08-26 HISTORY — DX: Esophageal obstruction: K22.2

## 2010-08-27 NOTE — Op Note (Signed)
[name], [name]             ACCOUNT NO.:  [id number]  MEDICAL RECORD NO.:  [id number]           PATIENT TYPE:  O  LOCATION:  DAYP                          FACILITY:  [facility]  PHYSICIAN:  R. Roetta Sessions, M.D. DATE OF BIRTH:  08/17/1928  DATE OF PROCEDURE:  08/26/2010 DATE OF DISCHARGE:                              OPERATIVE REPORT   PROCEDURE:  EGD with Elease Hashimoto dilation followed by biopsy.  INDICATIONS FOR PROCEDURE:  A 77 year old lady with dysphagia and odynophagia to solids with a single longstanding gastroesophageal reflux disease, started on Nexium last week.  Did not get the prescription filled because of cost, but took one of her family member's Nexium and shortly thereafter her lips and mouth started to swell, and she stopped this medication.  She says the odynophagia component has improved.  She still has esophageal dysphagia.  EGD is now being done potential for esophageal dilation, etc.  Reviewed risks, benefits, limitations, alternatives, imponderables have been discussed.  Please see the documentation in the medical record.  PROCEDURE NOTE:  O2 saturation, blood pressure, pulse, respirations were monitored throughout the entire procedure.  CONSCIOUS SEDATION:  Versed 2 mg IV, Demerol 50 mg IV in a single dose. Cetacaine spray for topical pharyngeal anesthesia.  INSTRUMENT:  Pentax video chip system.  FINDINGS:  Examination of tubular esophagus revealed distal esophageal erosions in the GE junction.  There were a couple of long linear erosions, the longest being 5 cm.  Up into the esophagus from the GE junction, there was accentuated undulating Z-line, somewhat patulous EG junction, but there was a Schatzki's ring present.  No Barrett's esophagus.  EG junction was easily traversed with a diagnostic scope.  Stomach:  Gastric cavity was emptied and insufflated well with air. Thorough examination of gastric mucosa revealed surgically alteration of the distal  stomach with a single outlet.  Pylorus was not apparent consistent with Billroth I configuration.  The small bowel, efferent limb was intubated several centimeters and segment of GI tract appeared normal.  The patient did have some linear erosions in the distal gastric mucosa.  Just proximal to the anastomosis, there was also a small hiatal hernia.  No other abnormalities were observed.  Scope was withdrawn.  A 56-French Maloney dilator was passed to full insertion with ease.  A look back revealed no apparent complication related to passage of the dilator.  Subsequent biopsies of the inflamed-appearing gastric mucosa were taken for histologic study.  The patient tolerated the procedure well.  IMPRESSION: 1. Distal esophageal erosions consistent with moderately severe     erosive reflux esophagitis. 2. Schatzki's ring status post dilation. 3. 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 4. Linear gastric erosions status post biopsy.  RECOMMENDATIONS: 1. Nexium and omeprazole based products should be placed on the     allergy list and she should not take any of these agents in the     future. 2. I will try her on a course of Aciphex 20 mg orally daily x2 weeks.     She is to take my prescription and go to  the office for coupon to     get her 2 weeks' worth of free medication from the pharmacy.  I suspect she may have difficulty to pay for Aciphex subsequently and we may have to go to a less expensive alternative and I would most likely select Protonix.  If patient has any difficulties with Aciphex, she is to let us know. Otherwise, we will hear from her in 2 weeks and go from there. 1. Follow up on path.     Jonathon Bellows, M.D.     RMR/MEDQ  D:  08/26/2010  T:  08/26/2010  Job:  [id number]  cc:   Milus Mallick. Lodema Hong, M.D. Fax: [phone]  Electronically Signed by Lorrin Goodell M.D. on 08/27/2010 07:48:24 AM

## 2010-08-28 NOTE — Progress Notes (Signed)
  Subjective:    Patient ID: [name], female    DOB: Sep 04, 1928, 77 y.o.   MRN: [id number]  HPI Pt in with primarly complaint of abdominal pain and dysphagia , which has significantly worsened over the past several months. She does have GERD and has had dilatation in the past. Currently dysphagia is for solids. She denies change in bowel movements.   Review of Systems Denies recent fever or chills. Denies sinus pressure, nasal congestion, ear pain or sore throat. Denies chest congestion, productive cough or wheezing. Denies chest pains, palpitations, paroxysmal nocturnal dyspnea, orthopnea and leg swelling Denies  nausea, vomiting,diarrhea or constipation.  Denies rectal bleeding or change in bowel movement. Denies dysuria, frequency, hesitancy or incontinence. Denies joint pain, swelling and limitation and mobility. Denies headaches, seizure, numbness, or tingling. Denies depression,or insomnia.She has chronic anxiety with tremor Denies skin break down or rash.        Objective:   Physical Exam Patient alert and oriented and in no Cardiopulmonary distress.  HEENT: No facial asymmetry, EOMI, no sinus tenderness, TM's clear, Oropharynx pink and moist.Candidiasis of the tongue. Neck supple no adenopathy.  Chest: Clear to auscultation bilaterally.  CVS: S1, S2 no murmurs, no S3.  ABD: Soft mild epigastric tenderness, no guarding or rebound. No organomegaly or masses.Bowel sounds normal.  Ext: No edema  MS: Adequate ROM spine, shoulders, hips and knees.  Skin: Intact, no ulcerations or rash noted.  Psych: Good eye contact, normal affect. Memory intact not  depressed appearing.  CNS: CN 2-12 intact, power, tone and sensation normal throughout.        Assessment & Plan:  1. GERD: deteriorated with dysphagia.Pt has GI appt in am and I anticipate upper endo in the near future 2. Hypertension: uncontrolled add HCTZ Chronic anxiety : improved

## 2010-08-29 ENCOUNTER — Telehealth: Payer: Self-pay

## 2010-08-29 NOTE — Telephone Encounter (Signed)
Pt called- she took the Aciphex x 4 days and got a swollen lip. Pt has not gotten new rx filled yet, she was waiting until the swelling in her lip went away, but she stated she has not had any reflux symptoms since she took the Aciphex. Pt will try the new rx and let us know how she is doing.

## 2010-08-29 NOTE — Telephone Encounter (Signed)
Add Aciphex to list of allergies

## 2010-09-03 NOTE — Telephone Encounter (Signed)
Aciphex added to allergy list

## 2010-09-04 ENCOUNTER — Encounter: Payer: Self-pay | Admitting: Internal Medicine

## 2010-09-05 ENCOUNTER — Encounter: Payer: Self-pay | Admitting: Internal Medicine

## 2010-09-05 NOTE — Progress Notes (Signed)
Cannot find path report; only "edited" entry which contains no results

## 2010-09-12 ENCOUNTER — Encounter: Payer: Self-pay | Admitting: Internal Medicine

## 2010-09-16 ENCOUNTER — Encounter: Payer: Medicare Other | Admitting: Family Medicine

## 2010-09-24 NOTE — Op Note (Signed)
NAMEBERNIE, [name]             ACCOUNT NO.:  [id number]   MEDICAL RECORD NO.:  [id number]          PATIENT TYPE:  AMB   LOCATION:  DAY                           FACILITY:  [facility]   PHYSICIAN:  R. Roetta Sessions, M.D. DATE OF BIRTH:  05/22/1928   DATE OF PROCEDURE:  12/15/2006  DATE OF DISCHARGE:                               OPERATIVE REPORT   COLONOSCOPY SNARE POLYPECTOMY BIOPSY STOOL SAMPLING:   INDICATIONS FOR PROCEDURE:  A 77 year old African American female with a  several-month history of chronic diarrhea and a history of colonic  adenomas.  Colonoscopy is now being done to further evaluate her  symptoms.  This approach has been discussed with the patient at length.  Potential risks, benefits, alternatives have been reviewed, questions  answered.  She is agreeable.  Please see documentation in the medical  record.   PROCEDURE NOTE:  O2 saturation, blood pressure, pulse, and respiration  were monitored throughout the entirety of the procedure.  Conscious  sedation:  Versed 3 mg IV, Demerol 75 mg IV in divided doses.  Instrument:  Pentax video chip system.   FINDINGS:  Digital rectal exam revealed no abnormalities.  Endoscopic  findings:  The prep was adequate.   Colon:  Colonic mucosa was surveyed from the rectosigmoid junction  through the left, transverse, and right colon to area of the appendiceal  orifice, ileocecal valve and cecum.  These structures were well-seen and  photographed for the record.  I attempted to intubate the terminal ileum  but was unable to do so.  From this level the scope was slowly and  cautiously withdrawn.  All previously-mentioned mucosal surfaces were  once again seen.  The patient had the following abnormalities:   1. Pancolonic diverticula.  2. Diminutive cecal polyp, cold biopsied.  3. Pedunculated polyps at the splenic and descending segments, cold      snared, and another diminutive polyp was cold biopsied.   Otherwise, colonic mucosa  appeared normal.  Segmental biopsies of the  sigmoid and rectal mucosa were taken for rule-out microscopic colitis.  The scope was pulled.  A thorough examination of the rectum was  performed including a retroflexed view of the anal verge that  demonstrated no abnormalities.  A stool sample was taken for histologic  study as stated above.  A rectal biopsy was taken to rule out  proctocolitis.  The patient tolerated the procedure well, was reacted in  endoscopy.   IMPRESSION:  1. Endoscopically normal-appearing rectum, status post biopsy.  2. Pancolonic diverticula.  3. Multiple colonic polyps.  All were fairly small, either removed via      cold biopsy or cold snare technique.  A single biopsy taken to rule      out microscopic proctocolitis.  4. Stool sample obtained.   RECOMMENDATIONS:  1. Polyp and diverticulosis literature provided to [name].  2. Follow up on stool and polyps.  3. Further recommendations to follow.      Jonathon Bellows, M.D.  Electronically Signed     RMR/MEDQ  D:  12/15/2006  T:  12/15/2006  Job:  [id number]

## 2010-09-24 NOTE — Op Note (Signed)
[name], [name]             ACCOUNT NO.:  [id number]   MEDICAL RECORD NO.:  [id number]          PATIENT TYPE:  AMB   LOCATION:  DAY                           FACILITY:  [facility]   PHYSICIAN:  Vickki Hearing, M.D.DATE OF BIRTH:  14-Aug-1928   DATE OF PROCEDURE:  05/28/2007  DATE OF DISCHARGE:                               OPERATIVE REPORT   PREOP DIAGNOSIS:  Osteoarthritis acromioclavicular joint left shoulder.   POSTOP DIAGNOSIS:  Osteoarthritis acromioclavicular joint left shoulder.   PROCEDURE:  Open distal clavicle excision left shoulder.   SURGEON:  Fuller Canada, M.D.   ASSISTANT:  There were no assistants.   ANESTHETIC:  General.   OPERATIVE FINDINGS:  Hypertrophy of the distal clavicle with  degenerative arthritis of the [facility] joint.   SPECIMENS:  Distal clavicle has two pieces of bone including the  articular surface.   DISPOSITION OF SPECIMENS:  To path.   BLOOD LOSS:  Minimal.   COMPLICATIONS:  None.   COUNTS:  Correct.   The patient to PACU in good condition.   SURGICAL INDICATIONS:  Pain.   BRIEF HISTORY:  [name] is a 77 year old female was followed for left  shoulder pain.  She was treated with rest, activity modification,  injection, physical therapy, hydrocodone.  She had an MRI which showed a  torn rotator cuff which was chronic.  She had clinically intact function  in forward elevation against resistance as well as abduction.  There was  degeneration of her acromioclavicular joint as well and this was the  area of maximal tenderness and therefore we offered her distal clavicle  excision.  She accepted with the risk and benefit ratio explained  including the possibility of persistent pain.   Surgery was done as follows.  The patient was identified in the preop  holding area.  She marked her left shoulder as the surgical site and  then I signed the [facility] joint as the surgical site.  I reviewed her history  and physical, signed her consent  form. She was taken to surgery.  She  had a general anesthetic.  After successful index induction of  anesthesia her left shoulder was prepped and draped using sterile  technique.  At this point the time-out procedure was completed.  Everyone agreed with the procedure and the patient ID.   The subcu tissue over the [facility] joint was injected with dilute Marcaine and  epinephrine solution.  A straight incision was made over the distal  clavicle in a longitudinal vertical manner. The subcu tissue was  divided.  The periosteum was split in line with the distal clavicle.  Subperiosteal dissection exposed the distal clavicle.  The distal 7-8 mm  of bone was removed with an oscillating saw and osteotome.  The back  corner of the distal clavicle was beveled.  The wound was irrigated.  Hemostasis was obtained.  Bone wax was put over the distal clavicle.  Excess bone wax was removed.  The wound was irrigated and closed with  zero Monocryl to close the periosteum, zero Monocryl to close the subcu  and a running 3-0 Prolene suture  with Steri-Strips over the top.  We injected 15 mL of Marcaine with epinephrine in the subcu area.  Sterile dressing and cryo cuff applied.  Cryo cuff activated.  The  patient extubated and taken to the recovery room in stable condition.   POSTOPERATIVE PLAN:  The postoperative plan is for the patient to be in  a cryo cuff.  I will see her on Tuesday. We will start therapy after I  see her.  She should be able to advance to full range of motion as  tolerated immediately. She is discharged on Vicodin 1-2 tablets q.4-6h.  p.r.n. for pain and Phenergan 25 q.6h. p.r.n. for nausea. We will  dispense 60 Vicodin with two refills and 30 Phenergan with no refills.      Vickki Hearing, M.D.  Electronically Signed     SEH/MEDQ  D:  05/28/2007  T:  05/28/2007  Job:  [id number]

## 2010-09-24 NOTE — H&P (Signed)
[name], [name]             ACCOUNT NO.:  [id number]   MEDICAL RECORD NO.:  [id number]          PATIENT TYPE:  AMB   LOCATION:  DAY                           FACILITY:  [facility]   PHYSICIAN:  Vickki Hearing, M.D.DATE OF BIRTH:  May 20, 1928   DATE OF ADMISSION:  DATE OF DISCHARGE:  LH                              HISTORY & PHYSICAL   CHIEF COMPLAINT:  Left shoulder pain.   HISTORY:  This is a 77 year old female who has been followed for left  shoulder pain.  This is noted to be nonradiating pain directly over the  [facility] joint.  She was treated with rest, activity modification, injection  therapy, Hydrocodone.  She has really fought a good fight in trying to  avoid surgery.  We were able to evaluated her shoulder with an MRI, and  although it showed a torn rotator cuff, her clinical exam did not bear  this out as she had intact abduction and forward elevation against  resistant grade of 5/5, external rotators the same.   She does have some cervical disk disease at C5-6 and C6-7 with  degenerative changes and loss of cervical lordosis.  There is  uncovertebral joint disease there as well.   Because she did not improve.  She sought further treatment which will  involve open distal clavicle excision for [facility] joint arthrosis and  impinging osteophyte.   MEDICATIONS:  Norvasc, Zyrtec, Aleve, Detrol, aspirin, Vicodin.   ALLERGY:  PENICILLIN.   SURGERIES:  Surgery for stomach ulcer February 12, 2007.   FAMILY HISTORY:  Diabetes, arthritis and kidney disease.   SOCIAL HISTORY:  Denies alcohol use or smoking.   REVIEW OF SYSTEMS:  Denied symptoms under General, Constitutional,  Cardiac, Respiratory, GI, GU, Neurologic, Endocrinologic, Psychiatric,  Dermatologic, ENT, Immunologic and Lymph.   PHYSICAL EXAMINATION:  GENERAL:  Examination reveals a well-developed,  well-nourished female with normal body habitus and no deformities.  SKIN:  Intact without lesions, cafe au lait spots or  bruising.  NEUROLOGICAL:  There are no visual deformities on inspection.  Pulses  are 2+ in the radial, ulnar, brachial and axillary regions.  They are  symmetric.  Capillary refill is less than 2 seconds.  There is no  evidence of ischemia, clubbing or cyanosis.  Sensation is intact grossly  in both upper extremities.  Motor function is normal as well, and her  reflexes are normal in the upper extremities.   Her left shoulder exam is remarkable for tenderness over the left AC  joint, although there is no gross deformity.  There is some swelling of  the joint with no erythema.  Range of motion shows flexion of 150  degrees active, 180 degrees passive, external rotation of 40 degrees,  internal rotation to L4, impingement sign of near on the left is  negative, impingement sign of Hawkins on the left is positive.  She had  a left negative apprehension sign with a negative relocation test.  The  sulcus sign showed no positive findings.  Yergason and Speed tests were  negative.  [facility] joint stress test was positive.   ASSESSMENT:  Osteoarthrosis of the left shoulder with a diagnosis code  of 715.11.   PLAN:  The plan is for an open distal clavicle excision of the left  shoulder or Mumford procedure.   Informed consent process was completed in the office.  I have discussed  the procedure with the patient.  She demonstrates understanding of what  we are trying to accomplish.  I have answered her questions.  We  discussed bleeding, infection, nerve and vascular injury.  The diagnosis  and reason for surgery were explained.  The patient demonstrates  understanding of this discussion.  Specific risks to this procedure  include continued pain.   ADDENDUM:  The patient added Geritol, vitamin E and lisinopril 20 mg as  medicines.      Vickki Hearing, M.D.  Electronically Signed     SEH/MEDQ  D:  05/27/2007  T:  05/28/2007  Job:  [id number]   cc:   [facility]

## 2010-09-24 NOTE — H&P (Signed)
NAMELAYALI, FREUND             ACCOUNT NO.:  [id number]   MEDICAL RECORD NO.:  [id number]          PATIENT TYPE:  AMB   LOCATION:                                FACILITY:  [facility]   PHYSICIAN:  Kassie Mends, M.D.      DATE OF BIRTH:  15-Jul-1928   DATE OF ADMISSION:  DATE OF DISCHARGE:  LH                              HISTORY & PHYSICAL   PRIMARY CARE PHYSICIAN:  Edward L. Juanetta Gosling, M.D.   CHIEF COMPLAINT:  Diarrhea.   HISTORY OF PRESENT ILLNESS:  Ms. Cozza is a 77 year old female with a 2-  3 month history of loose stools.  She describes them as semi-formed and  occasionally loose and watery but is having upwards of 10-20 stools a  day.  She denies any rectal bleeding, melena or mucus in her stools.  She was seen by Dr. Juanetta Gosling.  He did do some stool tests but she is  unsure and has not brought records with her today.  She tells me she has  lost 6 pounds in the last to 2-3 months.  She denies any abdominal pain  or fever.  She was on antibiotics about 3-4 months ago for an upper  respiratory infection.  She denies any foreign travel, denies any new  medications except for the antibiotics.  She is not diabetic.  She is  taking Aleve twice a day about four x a week and Bufferin 800 mg b.i.d.  as needed for her left shoulder spur pain.   PAST MEDICAL AND SURGICAL HISTORY:  She had a colonoscopy by Dr. Jena Gauss  09/01/2000.  She was found to have multiple adenomatous polyps which  were tubular adenomas.  She was scheduled to have another colonoscopy in  3 years. Chronic GERD, well-controlled hypertension, seasonal allergies,  left shoulder spur, renal lithiasis, cholecystectomy 30 years ago for  cholelithiasis.  She has had surgery on both feet.  She had a partial  gastrectomy secondary to peptic ulcer disease and hemorrhoidectomy as  well.  Her second  colonoscopy was performed on 11/22/2003.  She was  found to have a 2 x 3 cm polyp lesion in the mid ascending colon which  was partially  removed by piecemeal polypectomy and an adjacent  pedunculated polyp removed by snare cautery.  Biopsies showed fragmented  tubular villous adenoma with foci of increasing mucosal dysplasia from  the ascending colon.  She underwent another colonoscopy in 01/09/2004  and the previously noted large polyp was completed removed via snare  polypectomy and 2 adjacent polyps were removed via snare polypectomy  with biopsies consistent of fragment of a large tubular adenoma.  On  December 12, 2004 she underwent colonoscopy with snare polypectomy.  She  was found to have a pedunculated polyp at the cecum/ileocecal valve  which was an inflamed focally adenomatous polyp.   CURRENT MEDICATIONS:  Norvasc 5 mg daily, Zyrtec 10 mg daily, Aleve  b.i.d. p.r.n. about 4 days a week, aspirin 325 mg p.r.n., Bufferin 800  mg b.i.d. p.r.n.   ALLERGIES:  PENICILLIN, ACIPHEX   FAMILY HISTORY:  Noncontributory.   SOCIAL  HISTORY:  She is married.  She has two grown healthy children.  She is retired from [employer].  She has a 30-year history of smoking  about two packs a week.  Denies any alcohol or drug use.   REVIEW OF SYSTEMS:  See HPI otherwise negative review of systems.   PHYSICAL EXAMINATION:  Vital Signs: Weight 160 pounds, height  63  inches, temperature 97.7.  Blood pressure 158/90, pulse 72.  GENERAL:  Ms. Fukuhara is a 77 year old African-American female who is  alert and oriented, pleasant, cooperative, in no acute distress.  HEENT: Sclerae clear, nonicteric.  Conjunctiva pink.  Oropharynx pink,  moist without lesions.  NECK: Supple without thyromegaly.  CHEST: Heart regular rate and rhythm.  Normal S1-S2 without any murmurs,  clicks, rubs or gallops.  LUNGS: Clear to auscultation bilaterally.  ABDOMEN:  Positive bowel sounds x4.  No bruits auscultated.  Soft,  nontender and without palpable hepatosplenomegaly.  No rebound  tenderness or guarding.  RECTAL:  She does have some small, protruding  external hemorrhoids, no  internal masses palpated, small amount of medium brown stool obtained  from the vault which is Hemoccult negative.  No internal masses  palpated.  EXTREMITIES:  Without clubbing or edema bilaterally.  SKIN:  Warm and dry without any rash or jaundice.   IMPRESSION:  Ms. Countess is a 77 year old female with a 2-3 months  history of chronic diarrhea with 10-20 loose to semi-formed stools a  day.  I am unsure what I am unsure how far of a workup she has had  through Dr. Juanetta Gosling' office but I do know she has had some stool studies  done.  She has lost 6 pounds.  Given her history of adenomatous polyps  and dysplasia I would be very concerned about colorectal carcinoma or  recurrent polyps in this lady.  It is possible that this could be NSAID  induced as well as she is on a significant amount of a Aleve and aspirin  and I have asked her to withhold these.  Less likely would be  inflammatory bowel disease or microscopic colitis.   PLAN:  1. Begin Imodium 2 mg first thing in the morning.  2. Will request stool studies and any laboratory tests done through      Dr. Juanetta Gosling office.  At minimum is going to need CBC, sed rate and      TSH if this has not already been done, as well as a full set of      stool studies.  If this is unremarkable, then I will talk to Dr.      Jena Gauss about going ahead and proceeding with a complete colonoscopy      given her history.      Lorenza Burton, N.P.      Kassie Mends, M.D.  Electronically Signed    KJ/MEDQ  D:  12/01/2006  T:  12/01/2006  Job:  [id number]   cc:   Ramon Dredge L. Juanetta Gosling, M.D.  Fax: [phone]

## 2010-09-24 NOTE — Assessment & Plan Note (Signed)
NAMEAIMI, ESSNER                CHART#:  [id number]   DATE:  12/31/2006                       DOB:  May 03, 1929   CHIEF COMPLAINT:  Follow up diarrhea.   SUBJECTIVE:  [name] is a 77 year old African-American female, who  has history of chronic GERD.  She began to have diarrhea approximately  four months ago and was having upwards of ten stools a day.  She  underwent colonoscopy by Dr. Jena Gauss and was found to have multiple  colonic polyps, three of which were hyperplastic, two were tubular  adenomas and there was no evidence of microscopic colitis.  She also had  a CBC, which was normal; sed rate, LFTs and TSH, all of which were  normal.  She had a full set of stool studies, which were negative, as  well.  She had been on Bufferin one to two daily, Aleve, as well as  aspirin 325 mg daily.  She was taking this for left-shoulder pain.  When  she was seen in the office by me prior to the colonoscopy, this was  discontinued.  She tells me she is now having approximately five loose  to semi-formed stools a day.  She denies any abdominal pain, denies any  nausea or vomiting, fever or chills.  Her weight has remained stable.   CURRENT MEDICATIONS:  See the list from December 31, 2006.   ALLERGIES:  PENICILLIN and ACIPHEX.   OBJECTIVE:  VITAL SIGNS:  Weight 157 pounds, height 63 inches,  temperature 97.9, blood pressure 140/82 and pulse 72.  GENERAL:  Ms. Fromme is a well-developed, well-nourished, elderly  female, in no acute distress.  HEENT:  Sclerae clear, anicteric, conjunctivae pink.  Oropharynx pink  and moist without any lesions.  CHEST:  Heart regular rate and rhythm, normal S1, S2, without murmurs,  clicks, rubs or gallops.  ABDOMEN:  Positive bowel sounds times four.  No bruits auscultated.  Soft, nontender, nondistended.  Without palpable masses or  hepatosplenomegaly, without tenderness or guarding.  EXTREMITIES:  Without clubbing or edema bilaterally.   ASSESSMENT:   Ms. Fuertes is a 77 year old female with a four-month  history of chronic diarrhea.  Recent colonoscopy and stool studies did  not show reason for diarrhea.  Prior to colonoscopy, she had been on a  significant amount of Aleve, aspirin and Bufferin, combined, which I  suspect may have caused problems for her.  She does have the history of  multiple adenomatous polyps and CIS and is going to need another  colonoscopy in three years.  I suspect she could have an element of IBS,  as well.   History of GERD.  No complaints at this time.   As a side note, she complains of leg pain and pins and needles.  I  suspect she may have neuropathy and I have asked her to follow up with  Dr. Juanetta Gosling regarding this.   PLAN:  1. Colonoscopy in three years.  2. Irritable bowel syndrome literature given for her review.  3. Tylenol Arthritis, instead of Aleve.  4. Multivitamin daily.  5. Align probiotics once daily.  We have given her three boxes of      samples.  6. She is to follow up with Dr. Juanetta Gosling regarding her leg pain.  7. She can also as Dr. Juanetta Gosling as to whether  she should be using a      daily baby aspirin.       Lorenza Burton, N.P.  Electronically Signed     R. Roetta Sessions, M.D.  Electronically Signed    KJ/MEDQ  D:  12/31/2006  T:  01/01/2007  Job:  [id number]   cc:   Ramon Dredge L. Juanetta Gosling, M.D.

## 2010-09-26 ENCOUNTER — Telehealth: Payer: Self-pay

## 2010-09-26 NOTE — Telephone Encounter (Signed)
Pt called- left voicemail- she is still having dysphagia- even to water at times. Wants to know what she should do. Please advise.

## 2010-09-27 NOTE — Consult Note (Signed)
NAMESHANA, ZAVALETA                         ACCOUNT NO.:  [id number]   MEDICAL RECORD NO.:  [id number]                   PATIENT TYPE:  OUT   LOCATION:  RAD                                  FACILITY:  [facility]   PHYSICIAN:  Nicholas Lose, N.P.           DATE OF BIRTH:  [birth year]   DATE OF CONSULTATION:  DATE OF DISCHARGE:                                   CONSULTATION   CHIEF COMPLAINT:  Heartburn.   HISTORY OF PRESENT ILLNESS:  [name] is a 77 year old African  American female who presents to our office with history of chronic GERD.  Today, she is complaining of exacerbation of heartburn type symptoms.  She  is also complaining of severe halitosis.  She notes her symptoms have been  worse lately.  She was on Aciphex in the past.  However, this is causing dry  skin.  Therefore, she discontinued it.  She has been taking Prilosec 20 mg  daily regularly over the last couple of weeks.  However, prior to this, she  was taking it intermittently.  She also takes intermittent Maalox.  She  denies any dysphagia or odynophagia.  She denies any early satiety.  She  denies any nausea, vomiting or abdominal pain.  Bowel movements have been  soft, brown and between once and three times daily.  Appetite is good.  Weight has remained stable.  She denies any melena or rectal bleeding.   She underwent EGD by Dr. Jena Gauss on March 21, 2002, which revealed normal  esophagus, small hiatal hernia and a 1 cm hyperplastic polyp in the  prepyloric antral mucosa.  She had colonoscopy on September 01, 2000, which  revealed external hemorrhoids, a few scatter pan colonic diverticula and  multiple colonic polyps.  She had a 1 cm sausage shaped polyp on the fold in  the cecum and multiple other pedunculated polyps, the largest being  approximately 1 cm at 40 cm.  Biopsies were positive for tubular adenomas.  She is due for repeat colonoscopy at this time.   CURRENT MEDICATIONS:  1. Norvasc 5 mg  daily.  2. Clorazepate 7.5 mg p.r.n.  3. Zyrtec 10 mg daily.  4. Prilosec 20 mg p.r.n.   ALLERGIES:  PENICILLIN AND ACIPHEX CAUSES DRY SKIN.   PAST MEDICAL HISTORY:  1. Last colonoscopy in 2002 as described in HPI.  2. Last EGD as described in HPI with hyperplastic gastric polyps.  3. Anxiety.  4. Seasonal allergies.  5. History of peptic ulcer disease about 45 years ago.   PAST SURGICAL HISTORY:  1. Partial gastrectomy secondary to peptic ulcer disease.  2. Cholecystectomy 25 years ago.  3. Left benign breast cyst removal.  4. Hemorrhoidectomy.   FAMILY HISTORY:  Negative for colorectal carcinoma, chronic liver or GI  problems.  Mother and father are deceased with history significant for CVA.  She has five healthy sisters, two brothers deceased and one alive with  unremarkable history.   SOCIAL HISTORY:  [name] has been married for 45 years.  She has two  children that are relatively healthy.  She is currently retired.  She  reports a 38 year history of tobacco abuse, currently smoking about a pack  per week for the last three years but with history significant for history  of pack per day in prior years.  Denies any alcohol or drug use.   REVIEW OF SYSTEMS:  CONSTITUTIONAL:  Weight is reportedly stable.  Appetite  is okay.  Denies any fatigue.  Denies any fevers or chills.  CARDIOVASCULAR:  Denies any chest pain or palpitations.  PULMONOLOGY:  Denies any shortness  of breath, dyspnea, cough or hemoptysis.  GI:  See HPI.   PHYSICAL EXAMINATION:  VITAL SIGNS:  Weight 165.5 pounds, height 63 inches,  blood pressure 140/80, pulse 74.  GENERAL APPEARANCE:  [name] is a 35 year old African American female who  is alert, oriented, pleasant, cooperative and in no acute distress.  HEENT:  Sclerae are clear, nonicteric.  Conjunctivae are pink.  Oropharynx  pink and moist without any lesions.  NECK:  Supple without any mass or thyromegaly.  CHEST:  She does have right  clavicular protrusion and palpable mass, left  clavicle appears normal.  LUNGS:  Clear throughout.  No wheezes or crackles.  CARDIOVASCULAR:  Heart:  Regular rate and rhythm with normal S1, S2 without  any murmurs, clicks, rubs or gallops.  ABDOMEN:  Positive bowel sounds x4.  No bruits auscultated.  She does have a  well healed vertical scar to the epigastrium.  Soft, nontender, nondistended  with no palpable mass or hepatosplenomegaly.  No rebound tenderness or  guarding.  RECTAL:  Deferred.  EXTREMITIES:  Good pedal pulses bilaterally.  No edema.  SKIN:  Brown, warm and dry without any rash or jaundice.   ASSESSMENT:  1. [name] is a 89 year old African American female with a recent     exacerbation of her chronic gastroesophageal reflux disease symptoms     including heartburn, reflux and most bothersome halitosis.  She has not     consistently been using PPI therapy.  EGD 18 months ago was reassuring.     Would like her to try consistent PPI therapy and __________ GERD     precautions to see whether this helps alleviate her symptoms.  If not,     further workup will be warranted at that time.  2. History of multiple colorectal tubular adenomas, and she is due for     repeat colonoscopy for evaluation of this.  We will schedule her for this     today.  3. Right clavicular protrusion/mass.  Will begin with right clavicular x-ray     as hopefully this is not related to her heartburn and symptoms that she     presents with today.  Will refer to primary care physician for further     evaluation if necessary.   RECOMMENDATIONS:  1. Will schedule colonoscopy by Dr. Jena Gauss in the near future regarding     tubular adenomatous polys follow up.  Prescription was given for Prevacid     30 mg b.i.d. #60 with three refills as well as two weeks worth of     samples.  2. She is to call in one week with progress report. 3. Will be contacting her regarding x-ray results.   Would like to  thank Dr. Juanetta Gosling for allowing [place] to participate in the care  of [name].      ___________________________________________                                            Nicholas Lose, N.P.   KC/MEDQ  D:  10/18/2003  T:  10/18/2003  Job:  [id number]

## 2010-10-01 ENCOUNTER — Ambulatory Visit: Payer: Medicare Other | Admitting: Urgent Care

## 2010-10-02 ENCOUNTER — Encounter: Payer: Self-pay | Admitting: Urgent Care

## 2010-10-02 ENCOUNTER — Ambulatory Visit (INDEPENDENT_AMBULATORY_CARE_PROVIDER_SITE_OTHER): Payer: Medicare Other | Admitting: Urgent Care

## 2010-10-02 DIAGNOSIS — R1319 Other dysphagia: Secondary | ICD-10-CM

## 2010-10-02 DIAGNOSIS — R197 Diarrhea, unspecified: Secondary | ICD-10-CM

## 2010-10-02 DIAGNOSIS — K219 Gastro-esophageal reflux disease without esophagitis: Secondary | ICD-10-CM

## 2010-10-02 DIAGNOSIS — R1314 Dysphagia, pharyngoesophageal phase: Secondary | ICD-10-CM

## 2010-10-02 DIAGNOSIS — R1310 Dysphagia, unspecified: Secondary | ICD-10-CM

## 2010-10-02 DIAGNOSIS — K529 Noninfective gastroenteritis and colitis, unspecified: Secondary | ICD-10-CM | POA: Insufficient documentation

## 2010-10-02 NOTE — Assessment & Plan Note (Signed)
Well controlled on ALIGN & prn questran.  Last Colonoscopy 2011.

## 2010-10-02 NOTE — Assessment & Plan Note (Addendum)
Chronic complicated GERD w/ Schatzki's ring.  She believes she is on PPI BID, but there is some confusion as to which one she is taking & which one she is intolerant of.  I have asked her to look at her bottles as soon as she gets home so we may update her medical records.

## 2010-10-02 NOTE — Assessment & Plan Note (Signed)
Persistent dysphagia despite recent esophageal dilation.  ? Persistent ring vs. Oropharyngeal component.  BPE ordered

## 2010-10-02 NOTE — Progress Notes (Signed)
Cc to PCP 

## 2010-10-02 NOTE — Telephone Encounter (Signed)
Pt has ov today 

## 2010-10-02 NOTE — Progress Notes (Signed)
Referring Provider: Syliva Overman, MD Primary Care Physician:  Syliva Overman, MD, MD Primary Gastroenterologist:  Dr. Jena Gauss  Chief Complaint  Patient presents with  . Dysphagia    HPI:  [name] is a 77 y.o. female here for follow up for recurrent dysphagia.  Feels "the same as before" dilation.  Food gets hung in mid-esophagus.  Even problems w/ water.  Using mints, seems to help.  Was using TUMS.  Did not bring her PPI today.  Quite confused as to which PPI she is taking, which she reacted to w/ swollen lips, & which PPI was just too expensive.  She thinks she is taking aciphex but our documentation states she called in with lip swelling form Aciphex.  Seen by ENT Dr. Suszanne Conners in [place].  C/o odynophagia.  Denies heartburn & indigestion.  Recent EGD w/ esophageal dialtion as below for Schatzki's ring & erosive esophagitis.  Taking PPI 1-2 per day.  She also has chronic diarrhea for which she takes [employer], academic daily & questran prn w/ good results.    Past Medical History  Diagnosis Date  . Hypertension 20 years   . Chronic diarrhea   . Anxiety   . Depression   . Kidney stone     hx/ crushed   . Hx of adenomatous colonic polyps     tubular adenomas, last found in 2008  . GERD (gastroesophageal reflux disease)   . Erosive esophagitis   . Schatzki's ring 08/26/10    Last dilated on EGD by Dr. Ronni Rumble [facility], linear gastric erosions, BI hemigastrectomy    Past Surgical History  Procedure Date  . Stomach ulcer 50 years ago     had some of her stomach removed   . Cholecystectomy 30 years ago  . Breast lumpectomy   . Bunion removal     from both feet   . Left shoulder surgery 2009    DR HARRISON  . Colonoscopy 03/19/2010    DR Jena Gauss,, normal TI, pancolonic diverticula, random colon bx neg., hyperplastic polyps removed  . Esophagogastroduodenoscopy 11/22/2003    DR Jena Gauss, erosive RE, [place] [facility]    Current Outpatient Prescriptions  Medication Sig Dispense Refill  .  amLODipine-olmesartan (AZOR) 10-40 MG per tablet Take 1 tablet by mouth daily.        Marland Kitchen aspirin 81 MG EC tablet Take 81 mg by mouth daily.        . cetirizine (ZYRTEC) 10 MG tablet Take 1 tablet (10 mg total) by mouth daily.  30 tablet  2  . cholestyramine light (PREVALITE) 4 GM/DOSE powder Take 4 g by mouth daily.       . citalopram (CELEXA) 10 MG tablet Take 10 mg by mouth daily.        . clonazePAM (KLONOPIN) 0.5 MG tablet Take 0.5 mg by mouth 2 (two) times daily.        . cyclobenzaprine (FLEXERIL) 10 MG tablet       . esomeprazole (NEXIUM) 40 MG capsule Take 1 capsule (40 mg total) by mouth daily.  30 capsule  1  . fluconazole (DIFLUCAN) 150 MG tablet       . hydrochlorothiazide (MICROZIDE) 12.5 MG capsule Take 1 capsule (12.5 mg total) by mouth daily.  30 capsule  3  . Iron-Vitamins (GERITOL COMPLETE) TABS Take by mouth.        . Multiple Vitamin (MULTIVITAMIN PO) Take by mouth daily.        . nebivolol (BYSTOLIC) 10 MG tablet Take 10  mg by mouth daily.        . Probiotic Product (PROBIOTIC PO) Take by mouth.        . vitamin E (VITAMIN E) 1000 UNIT capsule Take 1,000 Units by mouth daily.          Allergies as of 10/02/2010 - Review Complete 10/02/2010  Allergen Reaction Noted  . Aciphex (rabeprazole sodium)  09/03/2010  . Amlodipine besylate-valsartan  01/07/2010  . Nexium  10/02/2010  . Omeprazole  10/02/2010  . Penicillins  02/17/2007    Review of Systems: Gen: Denies any fever, chills, sweats, anorexia, fatigue, weakness, malaise, weight loss, and sleep disorder CV: Denies chest pain, angina, palpitations, syncope, orthopnea, PND, peripheral edema, and claudication. Resp: Denies dyspnea at rest, dyspnea with exercise, cough, sputum, wheezing, coughing up blood, and pleurisy. GI: Denies vomiting blood, jaundice, and fecal incontinence.    Derm: Denies rash, itching, dry skin, hives, moles, warts, or unhealing ulcers.  Psych: Denies depression, anxiety, memory loss, suicidal  ideation, hallucinations, paranoia, and confusion. Heme: Denies bruising, bleeding, and enlarged lymph nodes.  Physical Exam: BP 148/81  Pulse 56  Temp(Src) 97 F (36.1 C) (Temporal)  Ht 5\' 3"  (1.6 m)  Wt 174 lb 9.6 oz (79.198 kg)  BMI 30.93 kg/m2 General:   Alert,  Well-developed, well-nourished, pleasant and cooperative in NAD Head:  Normocephalic and atraumatic. Eyes:  Sclera clear, no icterus.   Conjunctiva pink. Mouth:  No deformity or lesions, dentition normal. Neck:  Supple; no masses or thyromegaly. Heart:  Regular rate and rhythm; no murmurs, clicks, rubs,  or gallops. Abdomen:  Soft, nontender and nondistended. No masses, hepatosplenomegaly or hernias noted. Normal bowel sounds, without guarding, and without rebound.   Msk:  Symmetrical without gross deformities. Normal posture. Pulses:  Normal pulses noted. Extremities:  Without clubbing or edema. Neurologic:  Alert and  oriented x4;  grossly normal neurologically. Skin:  Intact without significant lesions or rashes. Cervical Nodes:  No significant cervical adenopathy. Psych:  Alert and cooperative. Normal mood and affect.

## 2010-10-02 NOTE — Patient Instructions (Signed)
Call me back your medicine list as soon as you get home.

## 2010-10-03 NOTE — Progress Notes (Signed)
Pt called back.  Stated she cannot tolerate nexium.  Has been taking zantac and OTC antacids only.

## 2010-10-03 NOTE — Progress Notes (Signed)
Addended by: Lorenza Burton on: 10/03/2010 09:31 AM   Modules accepted: Orders

## 2010-10-08 ENCOUNTER — Ambulatory Visit (HOSPITAL_COMMUNITY)
Admission: RE | Admit: 2010-10-08 | Discharge: 2010-10-08 | Disposition: A | Discharge status: Home / Self Care | Payer: Medicare Other | Source: Ambulatory Visit | Attending: Urgent Care | Admitting: Urgent Care

## 2010-10-08 DIAGNOSIS — R1310 Dysphagia, unspecified: Secondary | ICD-10-CM | POA: Insufficient documentation

## 2010-10-08 DIAGNOSIS — K219 Gastro-esophageal reflux disease without esophagitis: Secondary | ICD-10-CM

## 2010-10-08 DIAGNOSIS — K222 Esophageal obstruction: Secondary | ICD-10-CM | POA: Insufficient documentation

## 2010-10-08 DIAGNOSIS — R1319 Other dysphagia: Secondary | ICD-10-CM

## 2010-10-17 ENCOUNTER — Encounter: Payer: Self-pay | Admitting: Family Medicine

## 2010-10-17 ENCOUNTER — Other Ambulatory Visit: Payer: Self-pay | Admitting: Family Medicine

## 2010-10-18 LAB — BASIC METABOLIC PANEL
CO2: 27 mEq/L (ref 19–32)
Chloride: 103 mEq/L (ref 96–112)
Glucose, Bld: 90 mg/dL (ref 70–99)
Potassium: 4 mEq/L (ref 3.5–5.3)
Sodium: 141 mEq/L (ref 135–145)

## 2010-10-21 ENCOUNTER — Ambulatory Visit (INDEPENDENT_AMBULATORY_CARE_PROVIDER_SITE_OTHER): Payer: Medicare Other | Admitting: Family Medicine

## 2010-10-21 ENCOUNTER — Encounter: Payer: Self-pay | Admitting: Family Medicine

## 2010-10-21 VITALS — BP 142/82 | HR 60 | Resp 16 | Ht 63.0 in | Wt 171.8 lb

## 2010-10-21 DIAGNOSIS — I10 Essential (primary) hypertension: Secondary | ICD-10-CM

## 2010-10-24 ENCOUNTER — Encounter: Payer: Medicare Other | Admitting: Family Medicine

## 2010-10-29 NOTE — Progress Notes (Signed)
  Subjective:    Patient ID: [name], female    DOB: 12/24/1928, 77 y.o.   MRN: [id number]  HPI    Review of Systems     Objective:   Physical Exam        Assessment & Plan:  Pt left without being seen due to time constraints, she rescheduled

## 2010-12-10 ENCOUNTER — Encounter: Payer: Self-pay | Admitting: Family Medicine

## 2010-12-11 ENCOUNTER — Other Ambulatory Visit (HOSPITAL_COMMUNITY)
Admission: RE | Admit: 2010-12-11 | Discharge: 2010-12-11 | Disposition: A | Discharge status: Home / Self Care | Payer: Medicare Other | Source: Ambulatory Visit | Attending: Family Medicine | Admitting: Family Medicine

## 2010-12-11 ENCOUNTER — Encounter: Payer: Self-pay | Admitting: Family Medicine

## 2010-12-11 ENCOUNTER — Ambulatory Visit (INDEPENDENT_AMBULATORY_CARE_PROVIDER_SITE_OTHER): Payer: Medicare Other | Admitting: Family Medicine

## 2010-12-11 VITALS — BP 140/70 | HR 60 | Resp 16 | Ht 63.5 in | Wt 169.0 lb

## 2010-12-11 DIAGNOSIS — Z124 Encounter for screening for malignant neoplasm of cervix: Secondary | ICD-10-CM | POA: Insufficient documentation

## 2010-12-11 DIAGNOSIS — Z1211 Encounter for screening for malignant neoplasm of colon: Secondary | ICD-10-CM

## 2010-12-11 DIAGNOSIS — Z01419 Encounter for gynecological examination (general) (routine) without abnormal findings: Secondary | ICD-10-CM

## 2010-12-11 DIAGNOSIS — R5383 Other fatigue: Secondary | ICD-10-CM

## 2010-12-11 DIAGNOSIS — J301 Allergic rhinitis due to pollen: Secondary | ICD-10-CM

## 2010-12-11 DIAGNOSIS — K219 Gastro-esophageal reflux disease without esophagitis: Secondary | ICD-10-CM

## 2010-12-11 DIAGNOSIS — Z0000 Encounter for general adult medical examination without abnormal findings: Secondary | ICD-10-CM

## 2010-12-11 DIAGNOSIS — I10 Essential (primary) hypertension: Secondary | ICD-10-CM

## 2010-12-11 DIAGNOSIS — N760 Acute vaginitis: Secondary | ICD-10-CM

## 2010-12-11 DIAGNOSIS — Z79899 Other long term (current) drug therapy: Secondary | ICD-10-CM

## 2010-12-11 DIAGNOSIS — R5381 Other malaise: Secondary | ICD-10-CM

## 2010-12-11 LAB — POC HEMOCCULT BLD/STL (OFFICE/1-CARD/DIAGNOSTIC): Fecal Occult Blood, POC: NEGATIVE

## 2010-12-11 MED ORDER — AMLODIPINE-OLMESARTAN 10-40 MG PO TABS: 1.0000 | ORAL_TABLET | Freq: Every day | ORAL | Status: DC

## 2010-12-11 NOTE — Patient Instructions (Addendum)
F/u in 3 months, we will give you the flu vaccine at that visit.  Fasting labs 3 to  5 days  Before next visit.  We will contact you  With the results of your swab if they are abnormal  Keep well, and call for appt if you need to see me before.  I am happy that your grand daughter is on her way

## 2010-12-13 LAB — GC/CHLAMYDIA PROBE AMP, GENITAL: Chlamydia, DNA Probe: NEGATIVE

## 2010-12-13 LAB — WET PREP BY MOLECULAR PROBE: Candida species: NEGATIVE

## 2010-12-22 NOTE — Progress Notes (Signed)
  Subjective:    Patient ID: [name], female    DOB: 1929/02/17, 77 y.o.   MRN: [id number]  HPI The PT is here for annual exam, medication management and review of any available recent lab and radiology data.  Preventive health is updated, specifically  Cancer screening and Immunization.   Questions or concerns regarding consultations or procedures which the PT has had in the interim are  addressed. The PT denies any adverse reactions to current medications since the last visit.  There are no new concerns.  There are no specific complaints       Review of Systems Denies recent fever or chills. Denies sinus pressure, nasal congestion, ear pain or sore throat. Denies chest congestion, productive cough or wheezing. Denies chest pains, palpitations and leg swelling Denies abdominal pain, nausea, vomiting,diarrhea or constipation.   Denies dysuria, frequency, hesitancy or incontinence. Denies uncontrolled and disabling joint pain, swelling and limitation in mobility. Denies headaches, seizures, numbness, or tingling. Denies uncontrolled  depression, anxiety or insomnia. Denies skin break down or rash.        Objective:   Physical Exam Pleasant well nourished female, alert and oriented x 3, in no cardio-pulmonary distress. Afebrile. HEENT No facial trauma or asymetry. Sinuses non tender.  EOMI, PERTL, fundoscopic exam is normal, no hemorhage or exudate.  External ears normal, tympanic membranes clear. Oropharynx moist, no exudate, Neck: supple, no adenopathy,JVD or thyromegaly.No bruits.  Chest: Clear to ascultation bilaterally.No crackles or wheezes. Non tender to palpation  Breast: No asymetry,no masses. No nipple discharge or inversion. No axillary or supraclavicular adenopathy  Cardiovascular system; Heart sounds normal,  S1 and  S2 ,no S3.  No murmur, or thrill. Apical beat not displaced Peripheral pulses normal.  Abdomen: Soft, non tender, no  organomegaly or masses. No bruits. Bowel sounds normal. No guarding, tenderness or rebound.  Rectal:  No mass. Guaiac negative stool.  GU: External genitalia normal. No lesions. Vaginal canal normal.No discharge. Uterus normal size, no adnexal masses, no cervical motion or adnexal tenderness.  Musculoskeletal exam: Decreased OM of spine, hips , shoulders and knees. No deformity ,swelling or crepitus noted. No muscle wasting or atrophy.   Neurologic: Cranial nerves 2 to 12 intact.Resting tremor primarily associated with anxiety Power, tone ,sensation and reflexes normal throughout. No disturbance in gait. No tremor.  Skin: Intact, no ulceration, erythema , scaling or rash noted. Pigmentation normal throughout  Psych; Normal mood and affect. Judgement and concentration normal        Assessment & Plan:

## 2010-12-22 NOTE — Assessment & Plan Note (Signed)
Controlled, no change in medication  

## 2011-01-24 ENCOUNTER — Telehealth: Payer: Self-pay | Admitting: Family Medicine

## 2011-01-24 MED ORDER — CLONAZEPAM 0.5 MG PO TABS: 0.5000 mg | ORAL_TABLET | Freq: Two times a day (BID) | ORAL | Status: DC

## 2011-01-24 NOTE — Telephone Encounter (Signed)
Put in signature folder for dr to sign

## 2011-01-29 LAB — BASIC METABOLIC PANEL
BUN: 14
CO2: 26
Chloride: 107
GFR calc non Af Amer: >60
Glucose, Bld: 93
Potassium: 3.7
Sodium: 141

## 2011-01-29 LAB — CBC
HCT: 41.3
Hemoglobin: 13.7
MCV: 84.9
RDW: 14.1

## 2011-02-03 ENCOUNTER — Telehealth: Payer: Self-pay | Admitting: Family Medicine

## 2011-02-06 NOTE — Telephone Encounter (Signed)
Number busy x 2

## 2011-02-24 LAB — FECAL LACTOFERRIN, QUANT

## 2011-02-24 LAB — STOOL CULTURE

## 2011-02-24 LAB — OVA AND PARASITE EXAMINATION: Ova and parasites: NONE SEEN

## 2011-03-11 ENCOUNTER — Encounter: Payer: Self-pay | Admitting: Family Medicine

## 2011-03-13 ENCOUNTER — Encounter: Payer: Self-pay | Admitting: Family Medicine

## 2011-03-13 ENCOUNTER — Ambulatory Visit (INDEPENDENT_AMBULATORY_CARE_PROVIDER_SITE_OTHER): Payer: Medicare Other | Admitting: Family Medicine

## 2011-03-13 VITALS — BP 142/80 | HR 56 | Resp 16 | Ht 66.5 in | Wt 165.1 lb

## 2011-03-13 DIAGNOSIS — R5381 Other malaise: Secondary | ICD-10-CM

## 2011-03-13 DIAGNOSIS — R197 Diarrhea, unspecified: Secondary | ICD-10-CM

## 2011-03-13 DIAGNOSIS — F411 Generalized anxiety disorder: Secondary | ICD-10-CM

## 2011-03-13 DIAGNOSIS — R259 Unspecified abnormal involuntary movements: Secondary | ICD-10-CM

## 2011-03-13 DIAGNOSIS — I10 Essential (primary) hypertension: Secondary | ICD-10-CM

## 2011-03-13 DIAGNOSIS — Z23 Encounter for immunization: Secondary | ICD-10-CM

## 2011-03-13 DIAGNOSIS — E785 Hyperlipidemia, unspecified: Secondary | ICD-10-CM

## 2011-03-13 LAB — LIPID PANEL
Cholesterol: 181 mg/dL (ref 0–200)
HDL: 60 mg/dL (ref >39)
LDL Cholesterol: 101 mg/dL — ABNORMAL HIGH (ref 0–99)
Triglycerides: 100 mg/dL (ref <150)
VLDL: 20 mg/dL (ref 0–40)

## 2011-03-13 LAB — CBC WITH DIFFERENTIAL/PLATELET
Basophils Absolute: 0 10*3/uL (ref 0.0–0.1)
Basophils Relative: 0 % (ref 0–1)
Eosinophils Absolute: 0.1 10*3/uL (ref 0.0–0.7)
HCT: 40 % (ref 36.0–46.0)
Hemoglobin: 13.7 g/dL (ref 12.0–15.0)
MCH: 29 pg (ref 26.0–34.0)
MCHC: 34.3 g/dL (ref 30.0–36.0)
Monocytes Absolute: 0.4 10*3/uL (ref 0.1–1.0)
Monocytes Relative: 7 % (ref 3–12)
Neutro Abs: 4.2 10*3/uL (ref 1.7–7.7)
RDW: 13.9 % (ref 11.5–15.5)

## 2011-03-13 MED ORDER — CLONAZEPAM 0.5 MG PO TABS: 0.5000 mg | ORAL_TABLET | Freq: Two times a day (BID) | ORAL | Status: DC

## 2011-03-13 MED ORDER — AMLODIPINE-OLMESARTAN 10-40 MG PO TABS: 1.0000 | ORAL_TABLET | Freq: Every day | ORAL | Status: DC

## 2011-03-13 MED ORDER — NEBIVOLOL HCL 10 MG PO TABS: 10.0000 mg | ORAL_TABLET | Freq: Every day | ORAL | Status: DC

## 2011-03-13 NOTE — Patient Instructions (Addendum)
F/u in 4 months.  Fasting labs today, lipid, cbc and tsh/  YOU NEED TO STOP SMOKING!   You are doing very well, call if you need to see me before next appt.  No changes in your medications  A healthy diet is rich in fruit, vegetables and whole grains. Poultry fish, nuts and beans are a healthy choice for protein rather then red meat. A low sodium diet and drinking 64 ounces of water daily is generally recommended. Oils and sweet should be limited. Carbohydrates especially for those who are diabetic or overweight, should be limited to 34-45 gram per meal. It is important to eat on a regular schedule, at least 3 times daily. Snacks should be primarily fruits, vegetables or nuts.   Flu vaccine today

## 2011-03-15 NOTE — Progress Notes (Signed)
  Subjective:    Patient ID: [name], female    DOB: 06/07/1928, 77 y.o.   MRN: [id number]  HPI The PT is here for follow up and re-evaluation of chronic medical conditions, medication management and review of any available recent lab and radiology data.  Preventive health is updated, specifically  Cancer screening and Immunization.   Questions or concerns regarding consultations or procedures which the PT has had in the interim are  addressed. The PT denies any adverse reactions to current medications since the last visit.  There are no new concerns.  Still c/o chronic diarreah, recently evaluated by GI and on fiber, which helps a little bit    Review of Systems    See HPI Denies recent fever or chills. Denies sinus pressure, nasal congestion, ear pain or sore throat. Denies chest congestion, productive cough or wheezing. Denies chest pains, palpitations and leg swelling Denies abdominal pain, nausea, vomiting or constipation.   Denies dysuria, frequency, hesitancy or incontinence. Denies joint pain, swelling and limitation in mobility. Denies headaches, seizures, numbness, or tingling. Denies depression, anxiety or insomnia. Denies skin break down or rash.     Objective:   Physical Exam Patient alert and oriented and in no cardiopulmonary distress.  HEENT: No facial asymmetry, EOMI, no sinus tenderness,  oropharynx pink and moist.  Neck supple no adenopathy.  Chest: Clear to auscultation bilaterally.  CVS: S1, S2 no murmurs, no S3.  ABD: Soft non tender. Bowel sounds normal.  Ext: No edema  MS: Adequate ROM spine, shoulders, hips and knees.  Skin: Intact, no ulcerations or rash noted.  Psych: Good eye contact, normal affect. Memory intact not anxious or depressed appearing.  CNS: CN 2-12 intact, power, tone and sensation normal throughout.        Assessment & Plan:

## 2011-03-15 NOTE — Assessment & Plan Note (Signed)
Markedly improved, no med change 

## 2011-03-15 NOTE — Assessment & Plan Note (Signed)
Controlled, no change in medication  

## 2011-03-15 NOTE — Assessment & Plan Note (Signed)
Mildly improved, but remains a problem

## 2011-04-30 ENCOUNTER — Other Ambulatory Visit: Payer: Self-pay

## 2011-04-30 MED ORDER — CLONAZEPAM 0.5 MG PO TABS: 0.5000 mg | ORAL_TABLET | Freq: Two times a day (BID) | ORAL | Status: DC

## 2011-05-16 ENCOUNTER — Telehealth: Payer: Self-pay | Admitting: Family Medicine

## 2011-05-16 NOTE — Telephone Encounter (Signed)
Advise she will need to go to [place], no other doc in [place] will see her. She may want to reconsider and discuss further with dr Jena Gauss  Let me know if she wants to be referred to , I will enter referrral next week

## 2011-05-16 NOTE — Telephone Encounter (Signed)
Pt says that she is not willing to go to [facility] to another dr because of transportation.  Is asking about a referral to a dr.  In [place].  Please advise.  Otherwise she stated that she will discuss with Dr. Jena Gauss

## 2011-05-16 NOTE — Telephone Encounter (Signed)
pls check and see if there is a GI doc in [place], if so let me know, pt wants to change and go there. Let her know wha'ts happening once we get it straight.

## 2011-05-19 NOTE — Telephone Encounter (Signed)
Called pt and let her know that [name] office number has been disconnected. Advise her that i could refer to [name] are she could go back to [name] and discuss the situation. Pt is going to go back to [name] and discuss this with him.

## 2011-05-23 ENCOUNTER — Encounter: Payer: Self-pay | Admitting: Internal Medicine

## 2011-05-23 ENCOUNTER — Ambulatory Visit (INDEPENDENT_AMBULATORY_CARE_PROVIDER_SITE_OTHER): Payer: Medicare Other | Admitting: Internal Medicine

## 2011-05-23 VITALS — BP 150/80 | HR 68 | Temp 96.8°F | Ht 63.0 in | Wt 163.2 lb

## 2011-05-23 DIAGNOSIS — R197 Diarrhea, unspecified: Secondary | ICD-10-CM

## 2011-05-23 NOTE — Patient Instructions (Addendum)
Continue questran daily  Continue Align  Take one Imodium each morning as needed for loose stools  Stop prevacid 15 mg daily  Begin Dexilant 60 mg  orally daily. Samples provided  Followup with [place] in 3 months

## 2011-05-23 NOTE — Assessment & Plan Note (Addendum)
Diarrhea doing much better. Has occasional bad days. Tolerating Questran and alignvery well. Prevacid 15 mg orally daily has been inadequate in treating her reflux.  Chronic vague dysphagia symptoms but BPE results are reassuring. No further endoscopic evaluation warranted at this time.  Recommendations stop Prevacid begin Dexilant 60 mg orally daily. Samples provided  Add Imodium to her regimen once daily as needed for bouts of diarrhea.  Continue a line and cholestyramine.

## 2011-05-23 NOTE — Progress Notes (Signed)
Pt is aware of her OV to come back in April for follow up with extender

## 2011-05-23 NOTE — Progress Notes (Signed)
Primary Care Physician:  Syliva Overman, MD, MD Primary Gastroenterologist:  Dr. Jena Gauss  Pre-Procedure History & Physical: HPI:  [name] is a 78 y.o. female here for followup loose stools. Does well most the time with upwards of 3 bowel movements daily. Sometimes has 6. Takes Questran and aligned daily. Complains of reflux despite taking Prevacid 15 mg orally daily. Some vague dysphagia symptoms. However, barium pill esophagram demonstrating no obstruction.   Past Medical History  Diagnosis Date  . Hypertension 20 years   . Chronic diarrhea   . Anxiety   . Depression   . Kidney stone     hx/ crushed   . Hx of adenomatous colonic polyps     tubular adenomas, last found in 2008  . GERD (gastroesophageal reflux disease)   . Erosive esophagitis   . Schatzki's ring 08/26/10    Last dilated on EGD by Dr. Ronni [facility]umble [facility], linear gastric erosions, BI hemigastrectomy  . Hyperplastic colon polyp 03/19/10    tcs by Dr. Jena Gauss    Past Surgical History  Procedure Date  . Stomach ulcer 50 years ago     had some of her stomach removed   . Cholecystectomy 30 years ago  . Breast lumpectomy   . Bunion removal     from both feet   . Left shoulder surgery 2009    DR HARRISON  . Colonoscopy 03/19/2010    DR Jena Gauss,, normal TI, pancolonic diverticula, random colon bx neg., hyperplastic polyps removed  . Esophagogastroduodenoscopy 11/22/2003    DR Jena Gauss, erosive RE, [place] [facility]  . Esophagogastroduodenoscopy 08/26/10    Dr. Jena Gauss- gastric xanthelasma    Prior to Admission medications   Medication Sig Start Date End Date Taking? Authorizing Provider  amLODipine-olmesartan (AZOR) 10-40 MG per tablet Take 1 tablet by mouth daily. 03/13/11  Yes Syliva Overman, MD  aspirin 81 MG EC tablet Take 81 mg by mouth daily.     Yes Historical Provider, MD  calcium carbonate (TUMS EX) 750 MG chewable tablet Chew 1 tablet by mouth 3 (three) times daily.   Yes Historical Provider, MD  cetirizine (ZYRTEC) 10  MG tablet Take 1 tablet (10 mg total) by mouth daily. 08/21/10  Yes Syliva Overman, MD  cholestyramine light (PREVALITE) 4 GM/DOSE powder Take 4 g by mouth daily. Pt takes 2 scoops ( 8 grams ) daily   Yes Historical Provider, MD  clonazePAM (KLONOPIN) 0.5 MG tablet Take 1 tablet (0.5 mg total) by mouth 2 (two) times daily. 04/30/11  Yes Syliva Overman, MD  hydrochlorothiazide (MICROZIDE) 12.5 MG capsule Take 1 capsule (12.5 mg total) by mouth daily. 08/19/10 08/19/11 Yes Syliva Overman, MD  lansoprazole (PREVACID) 15 MG capsule Take 15 mg by mouth daily. Takes one only occasionally   Yes Historical Provider, MD  naproxen sodium (ANAPROX) 220 MG tablet Take 220 mg by mouth 2 (two) times daily with a meal.   Yes Historical Provider, MD  nebivolol (BYSTOLIC) 10 MG tablet Take 1 tablet (10 mg total) by mouth daily. 03/13/11  Yes Syliva Overman, MD  Probiotic Product (PROBIOTIC PO) Take by mouth.    Yes Historical Provider, MD  vitamin E (VITAMIN E) 1000 UNIT capsule Take 1,000 Units by mouth daily.     Yes Historical Provider, MD  Iron-Vitamins (GERITOL COMPLETE) TABS Take by mouth.      Historical Provider, MD  Multiple Vitamin (MULTIVITAMIN PO) Take by mouth daily.      Historical Provider, MD    Allergies as of 05/23/2011 -  Review Complete 05/23/2011  Allergen Reaction Noted  . Aciphex (rabeprazole sodium)  09/03/2010  . Amlodipine besylate-valsartan  01/07/2010  . Nexium  10/02/2010  . Omeprazole  10/02/2010  . Penicillins  02/17/2007    Family History  Problem Relation Age of Onset  . Hypertension Mother   . Kidney failure Brother     X1 ON DIALYSIS  . Diabetes Sister   . Cancer Sister     X2  . Hypertension Father     History   Social History  . Marital Status: Widowed    Spouse Name: N/A    Number of Children: 2  . Years of Education: N/A   Occupational History  . retired from [employer]    Social History Main Topics  . Smoking status: Current Everyday Smoker    Types:  Cigarettes  . Smokeless tobacco: Never Used   Comment: couple of cigarretes a day  . Alcohol Use: No  . Drug Use: No  . Sexually Active: No   Other Topics Concern  . Not on file   Social History Narrative  . No narrative on file    Review of Systems: See HPI, otherwise negative ROS  Physical Exam: BP 150/80  Pulse 68  Temp(Src) 96.8 F (36 C) (Tympanic)  Ht 5\' 3"  (1.6 m)  Wt 163 lb 3.2 oz (74.027 kg)  BMI 28.91 kg/m2 General:  Elderly Alert,  Well-developed, well-nourished, pleasant and cooperative in NAD Skin:  Intact without significant lesions or rashes. Eyes:  Sclera clear, no icterus.   Conjunctiva pink. Ears:  Normal auditory acuity. Nose:  No deformity, discharge,  or lesions. Mouth:  No deformity or lesions. Neck:  Supple; no masses or thyromegaly. No significant cervical adenopathy. Lungs:  Clear throughout to auscultation.   No wheezes, crackles, or rhonchi. No acute distress. Heart:  Regular rate and rhythm; no murmurs, clicks, rubs,  or gallops. Abdomen: Non-distended, normal bowel sounds.  Soft and nontender without appreciable mass or hepatosplenomegaly.  Pulses:  Normal pulses noted. Extremities:  Without clubbing or edema.  Impression/Plan:

## 2011-06-06 ENCOUNTER — Other Ambulatory Visit: Payer: Self-pay | Admitting: Family Medicine

## 2011-06-06 DIAGNOSIS — Z139 Encounter for screening, unspecified: Secondary | ICD-10-CM

## 2011-06-10 ENCOUNTER — Ambulatory Visit (HOSPITAL_COMMUNITY): Payer: Medicare Other

## 2011-07-10 ENCOUNTER — Other Ambulatory Visit: Payer: Self-pay | Admitting: Family Medicine

## 2011-07-10 DIAGNOSIS — I10 Essential (primary) hypertension: Secondary | ICD-10-CM | POA: Diagnosis not present

## 2011-07-10 DIAGNOSIS — Z79899 Other long term (current) drug therapy: Secondary | ICD-10-CM | POA: Diagnosis not present

## 2011-07-10 DIAGNOSIS — R5381 Other malaise: Secondary | ICD-10-CM | POA: Diagnosis not present

## 2011-07-11 ENCOUNTER — Ambulatory Visit: Payer: Medicare Other | Admitting: Family Medicine

## 2011-07-11 LAB — CBC WITH DIFFERENTIAL/PLATELET
Basophils Absolute: 0 10*3/uL (ref 0.0–0.1)
Basophils Relative: 1 % (ref 0–1)
Eosinophils Absolute: 0.2 10*3/uL (ref 0.0–0.7)
Eosinophils Relative: 3 % (ref 0–5)
HCT: 41.4 % (ref 36.0–46.0)
MCH: 28.6 pg (ref 26.0–34.0)
MCHC: 33.3 g/dL (ref 30.0–36.0)
MCV: 85.9 fL (ref 78.0–100.0)
Monocytes Absolute: 0.5 10*3/uL (ref 0.1–1.0)
Platelets: 180 10*3/uL (ref 150–400)
RDW: 13.9 % (ref 11.5–15.5)

## 2011-07-11 LAB — LIPID PANEL
Cholesterol: 232 mg/dL — ABNORMAL HIGH (ref 0–200)
LDL Cholesterol: 144 mg/dL — ABNORMAL HIGH (ref 0–99)
Triglycerides: 106 mg/dL (ref <150)

## 2011-07-11 LAB — BASIC METABOLIC PANEL
BUN: 23 mg/dL (ref 6–23)
Calcium: 9.4 mg/dL (ref 8.4–10.5)
Chloride: 103 mEq/L (ref 96–112)
Creat: 0.98 mg/dL (ref 0.50–1.10)

## 2011-07-14 ENCOUNTER — Encounter: Payer: Self-pay | Admitting: Family Medicine

## 2011-07-14 ENCOUNTER — Ambulatory Visit (INDEPENDENT_AMBULATORY_CARE_PROVIDER_SITE_OTHER): Payer: Medicare Other | Admitting: Family Medicine

## 2011-07-14 ENCOUNTER — Ambulatory Visit (HOSPITAL_COMMUNITY)
Admission: RE | Admit: 2011-07-14 | Discharge: 2011-07-14 | Disposition: A | Discharge status: Home / Self Care | Payer: Medicare Other | Source: Ambulatory Visit | Attending: Family Medicine | Admitting: Family Medicine

## 2011-07-14 VITALS — BP 128/70 | HR 73 | Resp 15 | Ht 63.0 in | Wt 171.0 lb

## 2011-07-14 DIAGNOSIS — J209 Acute bronchitis, unspecified: Secondary | ICD-10-CM | POA: Diagnosis not present

## 2011-07-14 DIAGNOSIS — Z1231 Encounter for screening mammogram for malignant neoplasm of breast: Secondary | ICD-10-CM | POA: Diagnosis not present

## 2011-07-14 DIAGNOSIS — E663 Overweight: Secondary | ICD-10-CM

## 2011-07-14 DIAGNOSIS — E785 Hyperlipidemia, unspecified: Secondary | ICD-10-CM

## 2011-07-14 DIAGNOSIS — R928 Other abnormal and inconclusive findings on diagnostic imaging of breast: Secondary | ICD-10-CM | POA: Insufficient documentation

## 2011-07-14 DIAGNOSIS — I10 Essential (primary) hypertension: Secondary | ICD-10-CM

## 2011-07-14 DIAGNOSIS — Z139 Encounter for screening, unspecified: Secondary | ICD-10-CM

## 2011-07-14 DIAGNOSIS — R197 Diarrhea, unspecified: Secondary | ICD-10-CM

## 2011-07-14 DIAGNOSIS — R269 Unspecified abnormalities of gait and mobility: Secondary | ICD-10-CM

## 2011-07-14 DIAGNOSIS — K529 Noninfective gastroenteritis and colitis, unspecified: Secondary | ICD-10-CM

## 2011-07-14 DIAGNOSIS — K219 Gastro-esophageal reflux disease without esophagitis: Secondary | ICD-10-CM

## 2011-07-14 DIAGNOSIS — H9209 Otalgia, unspecified ear: Secondary | ICD-10-CM

## 2011-07-14 DIAGNOSIS — R2689 Other abnormalities of gait and mobility: Secondary | ICD-10-CM | POA: Insufficient documentation

## 2011-07-14 MED ORDER — HYDROCHLOROTHIAZIDE 12.5 MG PO CAPS: 12.5000 mg | ORAL_CAPSULE | Freq: Every day | ORAL | Status: DC

## 2011-07-14 MED ORDER — AZITHROMYCIN 250 MG PO TABS: ORAL_TABLET | ORAL | Status: AC

## 2011-07-14 MED ORDER — CETIRIZINE HCL 10 MG PO TABS: 10.0000 mg | ORAL_TABLET | Freq: Every day | ORAL | Status: DC

## 2011-07-14 MED ORDER — PREDNISONE 10 MG PO TABS: ORAL_TABLET | ORAL | Status: DC

## 2011-07-14 MED ORDER — ALBUTEROL SULFATE HFA 108 (90 BASE) MCG/ACT IN AERS: 2.0000 | INHALATION_SPRAY | RESPIRATORY_TRACT | Status: DC | PRN

## 2011-07-14 MED ORDER — NEBIVOLOL HCL 10 MG PO TABS: 10.0000 mg | ORAL_TABLET | Freq: Every day | ORAL | Status: DC

## 2011-07-14 MED ORDER — AMLODIPINE-OLMESARTAN 10-40 MG PO TABS: 1.0000 | ORAL_TABLET | Freq: Every day | ORAL | Status: DC

## 2011-07-14 NOTE — Assessment & Plan Note (Signed)
Patient states she feels off-balance yet her gait and neuro- exam is normal. I will have her see ear nose and throat regarding the ear problem first. If this persists consider neurology workup or physical therapy for balance

## 2011-07-14 NOTE — Progress Notes (Signed)
  Subjective:    Patient ID: [name], female    DOB: [birth year], 78 y.o.   MRN: [id number]  HPI  Patient here for her chronic medical problems  Hypertension-tolerating medications no concerns   Wheezing- she has had wheezing for the past couple weeks. She had a URI a few weeks ago which she self treated. Still has a little runny nose and occasional cough which is nonproductive. She does not feel short of breath but has a lot of wheezing. She quit smoking 22 days ago.  Diarrhea-she's been taking 1 teaspoon of coconut twice a day, she stopped the cholestyramine, but continues to take the line. She is followed by GI and recently had an appointment in January.  Her stools have been much improved since she's started coconut  Feeling off balance- she has noticed her balance has been off some for the past few months. She does have a chronic left ear ache and that she wants to followup with ENT. She's not had any falls, denies vertigo, denies dizziness. Denies headache  GERD- she was given samples of dexilant At her last GI dizzy. She does not use her PPI very often. She has noticed his that when she belches she has a very sour taste in the back of her mouth.  Labs reviewed  Review of Systems - per above    GEN- denies fatigue, fever, weight loss,weakness, +recent illness HEENT- denies eye drainage, change in vision, +nasal discharge, CVS- denies chest pain, palpitations RESP- denies SOB, +cough, wheeze ABD- denies N/V, change in stools, abd pain GU- denies dysuria, hematuria, dribbling, incontinence MSK- denies joint pain, muscle aches, injury Neuro- denies headache, dizziness, syncope, seizure activity      Objective:   Physical Exam GEN- NAD, alert and oriented x3, wt gain 6 lbs HEENT- PERRL, EOMI, non injected sclera, pink conjunctiva, MMM, oropharynx clear, Left TM mild impaction, Right TM clear Neck- Supple, no bruit CVS- RRR, no murmur RESP- scattered wheeze, audible  wheeze, no rhonchi, no rales, normal WOB ABD-NABS,soft, NT,ND EXT- No edema Pulses- Radial, DP- 2+ Neuro- normal speech, moving all 4 ext equally, CNII-XII in tact, neg rhomberg Gait normal       Assessment & Plan:

## 2011-07-14 NOTE — Assessment & Plan Note (Addendum)
Improved with use of coconut. Patient brought an article  in the paper regarding this.

## 2011-07-14 NOTE — Patient Instructions (Addendum)
Schedule a follow-up with Ear, Nose and Throat . For your wheezing, use the inhaler- Albuterol every 4 hours as needed, take the zpak, prednisone  Continue the sample medications for your heartburn  Continue your blood pressure medication Watch the fatty foods and fried foods, your cholesterol is high. Fresh fruits and veggies and any exercise. Congratulations on quitting smoking  Recheck For breathing and wheezing-- Thursday

## 2011-07-14 NOTE — Assessment & Plan Note (Signed)
She has gained weight since quitting smoking. Which states she's been eating a lot of junk food.

## 2011-07-14 NOTE — Assessment & Plan Note (Signed)
We'll treat with Z-Pak, low-dose steroid and albuterol. Patient will follow up on Thursday for recheck.

## 2011-07-14 NOTE — Assessment & Plan Note (Signed)
Impaction removed today in the office. Patient will follow up with ear nose and throat for her chronic pain. No evidence of infection

## 2011-07-14 NOTE — Assessment & Plan Note (Signed)
Review labs. Patient's LDL goal is less than 130, she has not been exercising or watching her diet. She is committed to changing some of her habits at this time. We discussed fresh fruits and vegetables as well as limiting cooking oils and frying foods. Would recheck cholesterol panel in the next 3-6 months. She is overall doing very well so she may benefit from a statin

## 2011-07-14 NOTE — Assessment & Plan Note (Signed)
Patient to use when necessary PPI.

## 2011-07-14 NOTE — Assessment & Plan Note (Signed)
At goal no change to medication

## 2011-07-15 ENCOUNTER — Other Ambulatory Visit: Payer: Self-pay | Admitting: Family Medicine

## 2011-07-15 DIAGNOSIS — R928 Other abnormal and inconclusive findings on diagnostic imaging of breast: Secondary | ICD-10-CM

## 2011-07-17 NOTE — Progress Notes (Signed)
Subjective:     Patient ID: [name], female   DOB: May 24, 1928, 78 y.o.   MRN: [id number]  HPI   Review of Systems     Objective:   Physical Exam     Assessment:         Plan:     Patient seen for recheck on her wheezing and bronchitis. She's taken her antibiotics. She says overall she feels much better has limited wheeze. She has been using her albuterol incorrectly initially she sprayed up her nose and then she has been using the pump but did pacing downwards.  Respiratory- no wheeze good air movement, CTAB CVS- rrr     Continue medications, doing well overall.

## 2011-07-21 ENCOUNTER — Ambulatory Visit (INDEPENDENT_AMBULATORY_CARE_PROVIDER_SITE_OTHER): Payer: Medicare Other

## 2011-07-21 ENCOUNTER — Telehealth: Payer: Self-pay | Admitting: Family Medicine

## 2011-07-21 DIAGNOSIS — N3090 Cystitis, unspecified without hematuria: Secondary | ICD-10-CM

## 2011-07-21 LAB — POCT URINALYSIS DIPSTICK
Bilirubin, UA: NEGATIVE
Glucose, UA: NEGATIVE
Spec Grav, UA: 1.02
Urobilinogen, UA: 0.2

## 2011-07-21 NOTE — Progress Notes (Signed)
Pt in for nurse visit. Urine specimen obtained. Pt states that she feels like she has a "bladder infection".

## 2011-07-22 NOTE — Telephone Encounter (Signed)
Will call pt with further instructions.

## 2011-07-23 ENCOUNTER — Other Ambulatory Visit: Payer: Self-pay | Admitting: Family Medicine

## 2011-07-23 ENCOUNTER — Telehealth: Payer: Self-pay | Admitting: Family Medicine

## 2011-07-23 LAB — URINE CULTURE: Colony Count: 4000

## 2011-07-23 MED ORDER — CIPROFLOXACIN HCL 500 MG PO TABS: 500.0000 mg | ORAL_TABLET | Freq: Two times a day (BID) | ORAL | Status: AC

## 2011-07-23 NOTE — Telephone Encounter (Signed)
Have you heard back from the culture. Does she need antibiotic?

## 2011-07-23 NOTE — Telephone Encounter (Signed)
Pt walks in symptomatic for uTI, will send in cipro to both local stores, [facility], pt aware

## 2011-07-30 ENCOUNTER — Ambulatory Visit (HOSPITAL_COMMUNITY)
Admission: RE | Admit: 2011-07-30 | Discharge: 2011-07-30 | Disposition: A | Discharge status: Home / Self Care | Payer: Medicare Other | Source: Ambulatory Visit | Attending: Family Medicine | Admitting: Family Medicine

## 2011-07-30 DIAGNOSIS — R928 Other abnormal and inconclusive findings on diagnostic imaging of breast: Secondary | ICD-10-CM | POA: Diagnosis not present

## 2011-07-31 ENCOUNTER — Ambulatory Visit (INDEPENDENT_AMBULATORY_CARE_PROVIDER_SITE_OTHER): Payer: Medicare Other | Admitting: Otolaryngology

## 2011-07-31 DIAGNOSIS — H9209 Otalgia, unspecified ear: Secondary | ICD-10-CM

## 2011-08-14 ENCOUNTER — Encounter: Payer: Self-pay | Admitting: Internal Medicine

## 2011-08-18 ENCOUNTER — Ambulatory Visit (INDEPENDENT_AMBULATORY_CARE_PROVIDER_SITE_OTHER): Payer: Medicare Other | Admitting: Gastroenterology

## 2011-08-18 ENCOUNTER — Encounter: Payer: Self-pay | Admitting: Gastroenterology

## 2011-08-18 VITALS — BP 131/66 | HR 54 | Temp 98.4°F | Ht 63.5 in | Wt 168.0 lb

## 2011-08-18 DIAGNOSIS — R197 Diarrhea, unspecified: Secondary | ICD-10-CM | POA: Diagnosis not present

## 2011-08-18 DIAGNOSIS — R1314 Dysphagia, pharyngoesophageal phase: Secondary | ICD-10-CM

## 2011-08-18 DIAGNOSIS — K219 Gastro-esophageal reflux disease without esophagitis: Secondary | ICD-10-CM | POA: Diagnosis not present

## 2011-08-18 DIAGNOSIS — R1310 Dysphagia, unspecified: Secondary | ICD-10-CM

## 2011-08-18 DIAGNOSIS — R1319 Other dysphagia: Secondary | ICD-10-CM

## 2011-08-18 MED ORDER — DEXLANSOPRAZOLE 60 MG PO CPDR: 60.0000 mg | DELAYED_RELEASE_CAPSULE | Freq: Every day | ORAL | Status: DC

## 2011-08-18 NOTE — Patient Instructions (Signed)
Please take Dexilant one capsule 30 minutes before breakfast daily.  I have provided you with samples and prescription called in to your pharmacy. Call in two weeks if your swallowing is no better.  Office visit in three months.

## 2011-08-18 NOTE — Progress Notes (Signed)
Primary Care Physician: Syliva Overman, MD, MD  Primary Gastroenterologist:  Roetta Sessions, MD   Chief Complaint  Patient presents with  . Follow-up  . Dysphagia    food stoping    HPI: [name] is a 78 y.o. female here for f/u. Last seen in 05/2011 by Dr. Jena Gauss. H/O chronic diarrhea, chronic GERD, vague dysphagia. Last EGD as outlined below. Subsequent BPE demonstrated no obstruction.  No questran for 4 weeks. Takes tbsp coconut bid. Not taking PPI daily. Tried a couple of times but stopped because "it didn't work". Taking OTC antacids daily. C/O dysphagia. BM Stools more formed. BM 2-3 per day. No nocturnal diarrhea. No melena, brbpr. No abdominal pain.        . Esophagogastroduodenoscopy 08/26/10    Dr. Jena Gauss- gastric xanthelasma/Schatzki's ring status post dilation     Current Outpatient Prescriptions  Medication Sig Dispense Refill  . albuterol (VENTOLIN HFA) 108 (90 BASE) MCG/ACT inhaler Inhale 2 puffs into the lungs every 4 (four) hours as needed for wheezing.  1 Inhaler  1  . amLODipine-olmesartan (AZOR) 10-40 MG per tablet Take 1 tablet by mouth daily.  90 tablet  1  . aspirin 81 MG EC tablet Take 81 mg by mouth daily.        . calcium carbonate (TUMS EX) 750 MG chewable tablet Chew 1 tablet by mouth 3 (three) times daily.      . cetirizine (ZYRTEC) 10 MG tablet Take 1 tablet (10 mg total) by mouth daily.  90 tablet  1  .         . clonazePAM (KLONOPIN) 0.5 MG tablet Take 1 tablet (0.5 mg total) by mouth 2 (two) times daily.  180 tablet  1  . hydrochlorothiazide (MICROZIDE) 12.5 MG capsule Take 1 capsule (12.5 mg total) by mouth daily.  90 capsule  1  . Iron-Vitamins (GERITOL COMPLETE) TABS Take by mouth.        . lansoprazole (PREVACID) 15 MG capsule Take 15 mg by mouth daily. Takes one only occasionally      . Multiple Vitamin (MULTIVITAMIN PO) Take by mouth daily.        . naproxen sodium (ANAPROX) 220 MG tablet Take 220 mg by mouth 2 (two) times daily with a  meal.      . nebivolol (BYSTOLIC) 10 MG tablet Take 1 tablet (10 mg total) by mouth daily.  90 tablet  1  . predniSONE (DELTASONE) 10 MG tablet Take 20mg  daily for 5 days  10 tablet  0  . Probiotic Product (PROBIOTIC PO) Take by mouth.       . vitamin E (VITAMIN E) 1000 UNIT capsule Take 1,000 Units by mouth daily.        .           Allergies as of 08/18/2011 - Review Complete 08/18/2011  Allergen Reaction Noted  . Aciphex (rabeprazole sodium)  09/03/2010  . Amlodipine besylate-valsartan  01/07/2010  . Nexium  10/02/2010  . Omeprazole  10/02/2010  . Penicillins  02/17/2007    ROS:  General: Negative for anorexia, weight loss, fever, chills, fatigue, weakness. ENT: Negative for hoarseness, nasal congestion. CV: Negative for chest pain, angina, palpitations, dyspnea on exertion, peripheral edema.  Respiratory: Negative for dyspnea at rest, dyspnea on exertion, cough, sputum, wheezing.  GI: See history of present illness. GU:  Negative for dysuria, hematuria, urinary incontinence, urinary frequency, nocturnal urination.  Endo: Negative for unusual weight change.    Physical Examination:   BP  131/66  Pulse 54  Temp(Src) 98.4 F (36.9 C) (Temporal)  Ht 5' 3.5" (1.613 m)  Wt 168 lb (76.204 kg)  BMI 29.29 kg/m2  General: Well-nourished, well-developed in no acute distress.  Eyes: No icterus. Mouth: Oropharyngeal mucosa moist and pink , no lesions erythema or exudate. Lungs: Clear to auscultation bilaterally.  Heart: Regular rate and rhythm, no murmurs rubs or gallops.  Abdomen: Bowel sounds are normal, nontender, nondistended, no hepatosplenomegaly or masses, no abdominal bruits or hernia , no rebound or guarding.   Extremities: No lower extremity edema. No clubbing or deformities. Neuro: Alert and oriented x 4   Skin: Warm and dry, no jaundice.   Psych: Alert and cooperative, normal mood and affect.  Labs:  Lab Results  Component Value Date   WBC 6.3 07/10/2011   HGB  13.8 07/10/2011   HCT 41.4 07/10/2011   MCV 85.9 07/10/2011   PLT 180 07/10/2011   Lab Results  Component Value Date   CREATININE 0.98 07/10/2011   BUN 23 07/10/2011   NA 143 07/10/2011   K 4.0 07/10/2011   CL 103 07/10/2011   CO2 29 07/10/2011      Lab Results  Component Value Date   TSH 2.187 07/10/2011

## 2011-08-19 ENCOUNTER — Encounter: Payer: Self-pay | Admitting: Gastroenterology

## 2011-08-19 NOTE — Assessment & Plan Note (Signed)
Patient no longer on questran. Taking coconut bid. If recurrent issues, she should resume [place] +/- imodium.

## 2011-08-19 NOTE — Assessment & Plan Note (Signed)
Persistent dysphagia may be more related to poorly controlled GERD. Patient does not have good understanding regarding her medications. I explained to her at length today, she needs to take Dexilant every day to PREVENT heartburn/GERD symptoms. Hopefully she will be able to stop OTC antacids once she takes Dexilant for a few days. If her dysphagia symptoms persists after couple of weeks on Dexilant, then consider EGD/ED.   OV 3 months.

## 2011-08-19 NOTE — Progress Notes (Signed)
Faxed to PCP

## 2011-08-21 ENCOUNTER — Other Ambulatory Visit: Payer: Self-pay | Admitting: Gastroenterology

## 2011-08-21 MED ORDER — LANSOPRAZOLE 30 MG PO CPDR: 30.0000 mg | DELAYED_RELEASE_CAPSULE | Freq: Every day | ORAL | Status: DC

## 2011-08-21 NOTE — Progress Notes (Signed)
Insurance will not cover Dexilant AT ALL. States medication is not an option.   Pt is allergic to Aciphex, Nexium, Omeprazole. Was taking Prevacid 15 mg daily.   1. Let's find out exact reactions. 2. Increase Prevacid dose to 30 mg daily.  3. PR in 14 days

## 2011-08-25 ENCOUNTER — Ambulatory Visit (INDEPENDENT_AMBULATORY_CARE_PROVIDER_SITE_OTHER): Payer: Medicare Other | Admitting: Family Medicine

## 2011-08-25 ENCOUNTER — Encounter: Payer: Self-pay | Admitting: Family Medicine

## 2011-08-25 VITALS — BP 130/70 | HR 60 | Resp 18 | Ht 63.0 in | Wt 167.1 lb

## 2011-08-25 DIAGNOSIS — E785 Hyperlipidemia, unspecified: Secondary | ICD-10-CM | POA: Diagnosis not present

## 2011-08-25 DIAGNOSIS — K219 Gastro-esophageal reflux disease without esophagitis: Secondary | ICD-10-CM

## 2011-08-25 DIAGNOSIS — I10 Essential (primary) hypertension: Secondary | ICD-10-CM | POA: Diagnosis not present

## 2011-08-25 DIAGNOSIS — R259 Unspecified abnormal involuntary movements: Secondary | ICD-10-CM

## 2011-08-25 DIAGNOSIS — F411 Generalized anxiety disorder: Secondary | ICD-10-CM

## 2011-08-25 DIAGNOSIS — J301 Allergic rhinitis due to pollen: Secondary | ICD-10-CM

## 2011-08-25 DIAGNOSIS — F419 Anxiety disorder, unspecified: Secondary | ICD-10-CM

## 2011-08-25 NOTE — Assessment & Plan Note (Signed)
Controlled, no change in medication  

## 2011-08-25 NOTE — Progress Notes (Signed)
Tried to call pt- NA 

## 2011-08-25 NOTE — Progress Notes (Signed)
  Subjective:    Patient ID: [name], female    DOB: 1928-08-04, 78 y.o.   MRN: [id number]  HPI The PT is here for follow up and re-evaluation of chronic medical conditions, medication management and review of any available recent lab and radiology data.  Preventive health is updated, specifically  Cancer screening and Immunization.   Questions or concerns regarding consultations or procedures which the PT has had in the interim are  addressed. The PT denies any adverse reactions to current medications since the last visit.  There are no new concerns.  There are no specific complaints       Review of Systems See HPI Denies recent fever or chills. Denies sinus pressure, nasal congestion, ear pain or sore throat.Increased allergy symptoms of nasal congestin and watery eys as expected during Spring, good response to zyrtec Denies chest congestion, productive cough or wheezing. Denies chest pains, palpitations and leg swelling Denies abdominal pain, nausea, vomiting,diarrhea or constipation.   Denies dysuria, frequency, hesitancy or incontinence. Denies joint pain, swelling and limitation in mobility. Denies headaches, seizures, numbness, or tingling. Denies depression,or uncontrolled  anxiety or insomnia. Denies skin break down or rash.        Objective:   Physical Exam  Patient alert and oriented and in no cardiopulmonary distress.  HEENT: No facial asymmetry, EOMI, no sinus tenderness,  oropharynx pink and moist.  Neck supple no adenopathy.Nasal mucosa erythematous  Chest: Clear to auscultation bilaterally.  CVS: S1, S2 no murmurs, no S3.  ABD: Soft non tender. Bowel sounds normal.  Ext: No edema  MS: Adequate ROM spine, shoulders, hips and knees.  Skin: Intact, no ulcerations or rash noted.  Psych: Good eye contact, normal affect. Memory intact not anxious or depressed appearing.  CNS: CN 2-12 intact, power, tone and sensation normal throughout.         Assessment & Plan:

## 2011-08-25 NOTE — Patient Instructions (Addendum)
F/u end September, call if you need me before    It is important that you exercise regularly at least 30 minutes 5 times a week. If you develop chest pain, have severe difficulty breathing, or feel very tired, stop exercising immediately and seek medical attention    Weight loss goal of 8 to 10 pounds.PLEASE use the juicer, only healthy food is prepared in it, vegetables and fruit!!!   It is important to reduce butter, cheese, red meat, fried and fatty foods. Your total and bad cholesterol are too high, and this increases your risk of heart disease and stroke.   Fasting lipid, and chem 7 in 5 month

## 2011-08-26 NOTE — Progress Notes (Signed)
Great!

## 2011-08-26 NOTE — Progress Notes (Signed)
Spoke with pt- she said it has been so long that she doesn't remember what kind of reaction she had. She said the dexilant was working well. Advised her that her insurance will not cover it. She is going to finish the dexilant samples she has, then she will try increasing the prevacid to 30mg  and will call us with a PR.

## 2011-09-09 NOTE — Progress Notes (Signed)
REVIEWED.  

## 2011-09-15 DIAGNOSIS — F419 Anxiety disorder, unspecified: Secondary | ICD-10-CM | POA: Insufficient documentation

## 2011-09-15 NOTE — Assessment & Plan Note (Signed)
Well controlled on klonopin

## 2011-09-15 NOTE — Assessment & Plan Note (Signed)
Increased symptoms in spring expected, pt to use zyrtec

## 2011-09-15 NOTE — Assessment & Plan Note (Signed)
Marked improvement as well as in anxiety

## 2011-10-08 ENCOUNTER — Other Ambulatory Visit: Payer: Self-pay

## 2011-10-08 ENCOUNTER — Telehealth: Payer: Self-pay | Admitting: Family Medicine

## 2011-10-08 MED ORDER — CLONAZEPAM 0.5 MG PO TABS: 0.5000 mg | ORAL_TABLET | Freq: Two times a day (BID) | ORAL | Status: DC

## 2011-10-08 NOTE — Telephone Encounter (Signed)
Printed and sent  

## 2011-10-09 ENCOUNTER — Telehealth: Payer: Self-pay | Admitting: Family Medicine

## 2011-10-09 NOTE — Telephone Encounter (Signed)
Sent to [facility] 

## 2011-11-17 ENCOUNTER — Ambulatory Visit: Payer: Medicare Other | Admitting: Gastroenterology

## 2011-12-09 DIAGNOSIS — H52229 Regular astigmatism, unspecified eye: Secondary | ICD-10-CM | POA: Diagnosis not present

## 2011-12-09 DIAGNOSIS — H5200 Hypermetropia, unspecified eye: Secondary | ICD-10-CM | POA: Diagnosis not present

## 2011-12-09 DIAGNOSIS — H35039 Hypertensive retinopathy, unspecified eye: Secondary | ICD-10-CM | POA: Diagnosis not present

## 2011-12-09 DIAGNOSIS — H524 Presbyopia: Secondary | ICD-10-CM | POA: Diagnosis not present

## 2011-12-29 ENCOUNTER — Other Ambulatory Visit: Payer: Self-pay | Admitting: Family Medicine

## 2012-01-12 ENCOUNTER — Other Ambulatory Visit: Payer: Self-pay | Admitting: Family Medicine

## 2012-01-19 ENCOUNTER — Telehealth: Payer: Self-pay | Admitting: Family Medicine

## 2012-01-19 NOTE — Telephone Encounter (Signed)
Patient aware that she can receive flu shot when she comes in for next appointment.

## 2012-01-26 ENCOUNTER — Other Ambulatory Visit: Payer: Self-pay

## 2012-01-26 ENCOUNTER — Other Ambulatory Visit: Payer: Self-pay | Admitting: Family Medicine

## 2012-01-26 DIAGNOSIS — I10 Essential (primary) hypertension: Secondary | ICD-10-CM

## 2012-01-26 MED ORDER — HYDROCHLOROTHIAZIDE 12.5 MG PO CAPS: 12.5000 mg | ORAL_CAPSULE | Freq: Every day | ORAL | Status: DC

## 2012-02-03 ENCOUNTER — Encounter: Payer: Self-pay | Admitting: Family Medicine

## 2012-02-03 ENCOUNTER — Ambulatory Visit (INDEPENDENT_AMBULATORY_CARE_PROVIDER_SITE_OTHER): Payer: Medicare Other | Admitting: Family Medicine

## 2012-02-03 VITALS — BP 138/72 | HR 67 | Resp 15 | Ht 63.0 in | Wt 167.1 lb

## 2012-02-03 DIAGNOSIS — F411 Generalized anxiety disorder: Secondary | ICD-10-CM | POA: Diagnosis not present

## 2012-02-03 DIAGNOSIS — F419 Anxiety disorder, unspecified: Secondary | ICD-10-CM

## 2012-02-03 DIAGNOSIS — I10 Essential (primary) hypertension: Secondary | ICD-10-CM | POA: Diagnosis not present

## 2012-02-03 DIAGNOSIS — E663 Overweight: Secondary | ICD-10-CM

## 2012-02-03 DIAGNOSIS — E785 Hyperlipidemia, unspecified: Secondary | ICD-10-CM | POA: Diagnosis not present

## 2012-02-03 DIAGNOSIS — J301 Allergic rhinitis due to pollen: Secondary | ICD-10-CM | POA: Diagnosis not present

## 2012-02-03 DIAGNOSIS — Z23 Encounter for immunization: Secondary | ICD-10-CM

## 2012-02-03 LAB — COMPREHENSIVE METABOLIC PANEL
Albumin: 4.3 g/dL (ref 3.5–5.2)
CO2: 31 mEq/L (ref 19–32)
Glucose, Bld: 79 mg/dL (ref 70–99)
Potassium: 4 mEq/L (ref 3.5–5.3)
Sodium: 143 mEq/L (ref 135–145)
Total Protein: 7 g/dL (ref 6.0–8.3)

## 2012-02-03 LAB — LIPID PANEL
Cholesterol: 188 mg/dL (ref 0–200)
HDL: 67 mg/dL (ref >39)

## 2012-02-03 MED ORDER — AMLODIPINE-OLMESARTAN 10-40 MG PO TABS: ORAL_TABLET | ORAL | Status: DC

## 2012-02-03 MED ORDER — NEBIVOLOL HCL 10 MG PO TABS: ORAL_TABLET | ORAL | Status: DC

## 2012-02-03 NOTE — Patient Instructions (Addendum)
Annual wellness in 4.5 month, please call if you need me before  Congrats on smoking cessation  Blood pressure today is excellent   Lipid and cmp today  Flu vaccine today  Please start walking for 30 minutes every day

## 2012-02-03 NOTE — Progress Notes (Signed)
  Subjective:    Patient ID: [name], female    DOB: 07/14/1928, 78 y.o.   MRN: [id number]  HPI The PT is here for follow up and re-evaluation of chronic medical conditions, medication management and review of any available recent lab and radiology data.  Preventive health is updated, specifically  Cancer screening and Immunization.   Questions or concerns regarding consultations or procedures which the PT has had in the interim are  addressed. The PT denies any adverse reactions to current medications since the last visit.  There are no new concerns.She is extremely proud, and rightly so, to announce that she has quit nicotine use  There are no specific complaints      Review of Systems See HPI Denies recent fever or chills. Denies sinus pressure, nasal congestion, ear pain or sore throat. Denies chest congestion, productive cough or wheezing. Denies chest pains, palpitations and leg swelling Denies abdominal pain, nausea, vomiting,diarrhea or constipation.   Denies dysuria, frequency, hesitancy or incontinence. Denies joint pain, swelling and limitation in mobility. Denies headaches, seizures, numbness, or tingling. Denies depression,uncontrolled  anxiety or insomnia. Denies skin break down or rash.        Objective:   Physical Exam  Patient alert and oriented and in no cardiopulmonary distress.  HEENT: No facial asymmetry, EOMI, no sinus tenderness,  oropharynx pink and moist.  Neck supple no adenopathy.  Chest: Clear to auscultation bilaterally.  CVS: S1, S2 no murmurs, no S3.  ABD: Soft non tender. Bowel sounds normal.  Ext: No edema  MS: Adequate ROM spine, shoulders, hips and knees.  Skin: Intact, no ulcerations or rash noted.  Psych: Good eye contact, normal affect. Memory intact not anxious or depressed appearing.  CNS: CN 2-12 intact, power, tone and sensation normal throughout.       Assessment & Plan:

## 2012-02-08 NOTE — Assessment & Plan Note (Signed)
Controlled , with no current flare

## 2012-02-08 NOTE — Assessment & Plan Note (Signed)
Controlled, no change in medication Hyperlipidemia:Low fat diet discussed and encouraged.  \ 

## 2012-02-08 NOTE — Assessment & Plan Note (Signed)
Controlled, no change in medication  

## 2012-02-08 NOTE — Assessment & Plan Note (Signed)
Unchanged. Patient re-educated about  the importance of commitment to a  minimum of 150 minutes of exercise per week. The importance of healthy food choices with portion control discussed. Encouraged to start a food diary, count calories and to consider  joining a support group. Sample diet sheets offered. Goals set by the patient for the next several months.    

## 2012-02-08 NOTE — Assessment & Plan Note (Signed)
Controlled, no change in medication DASH diet and commitment to daily physical activity for a minimum of 30 minutes discussed and encouraged, as a part of hypertension management. The importance of attaining a healthy weight is also discussed.  

## 2012-02-09 ENCOUNTER — Other Ambulatory Visit: Payer: Self-pay

## 2012-02-09 MED ORDER — CLONAZEPAM 0.5 MG PO TABS: 0.5000 mg | ORAL_TABLET | Freq: Two times a day (BID) | ORAL | Status: DC

## 2012-02-10 ENCOUNTER — Telehealth: Payer: Self-pay | Admitting: Family Medicine

## 2012-02-10 NOTE — Telephone Encounter (Signed)
Med sent.

## 2012-02-23 ENCOUNTER — Telehealth: Payer: Self-pay | Admitting: Family Medicine

## 2012-02-25 ENCOUNTER — Other Ambulatory Visit: Payer: Self-pay | Admitting: Family Medicine

## 2012-02-25 ENCOUNTER — Other Ambulatory Visit: Payer: Self-pay

## 2012-02-25 MED ORDER — FLUTICASONE PROPIONATE 50 MCG/ACT NA SUSP: 1.0000 | Freq: Every day | NASAL | Status: DC

## 2012-02-25 NOTE — Telephone Encounter (Signed)
flonase entered historically pls send to local pharmacy of her choice and let her know. I also accidentally sent 1 only to mail order pls cancel

## 2012-02-25 NOTE — Telephone Encounter (Signed)
[facility]

## 2012-02-25 NOTE — Telephone Encounter (Signed)
Tried to call pt back, no answer. No nasal spray on her list.

## 2012-03-16 ENCOUNTER — Encounter: Payer: Self-pay | Admitting: Family Medicine

## 2012-03-16 ENCOUNTER — Ambulatory Visit (INDEPENDENT_AMBULATORY_CARE_PROVIDER_SITE_OTHER): Payer: Medicare Other | Admitting: Family Medicine

## 2012-03-16 VITALS — BP 130/82 | HR 58 | Resp 16 | Ht 63.0 in | Wt 166.0 lb

## 2012-03-16 DIAGNOSIS — M19019 Primary osteoarthritis, unspecified shoulder: Secondary | ICD-10-CM | POA: Diagnosis not present

## 2012-03-16 DIAGNOSIS — J301 Allergic rhinitis due to pollen: Secondary | ICD-10-CM

## 2012-03-16 DIAGNOSIS — I10 Essential (primary) hypertension: Secondary | ICD-10-CM

## 2012-03-16 DIAGNOSIS — F411 Generalized anxiety disorder: Secondary | ICD-10-CM

## 2012-03-16 DIAGNOSIS — Z862 Personal history of diseases of the blood and blood-forming organs and certain disorders involving the immune mechanism: Secondary | ICD-10-CM | POA: Diagnosis not present

## 2012-03-16 DIAGNOSIS — R197 Diarrhea, unspecified: Secondary | ICD-10-CM

## 2012-03-16 DIAGNOSIS — F419 Anxiety disorder, unspecified: Secondary | ICD-10-CM

## 2012-03-16 DIAGNOSIS — Z8639 Personal history of other endocrine, nutritional and metabolic disease: Secondary | ICD-10-CM | POA: Diagnosis not present

## 2012-03-16 MED ORDER — NEBIVOLOL HCL 10 MG PO TABS: ORAL_TABLET | ORAL | Status: DC

## 2012-03-16 MED ORDER — KETOROLAC TROMETHAMINE 60 MG/2ML IM SOLN
60.0000 mg | Freq: Once | INTRAMUSCULAR | Status: AC
  Administered 2012-03-16: 60 mg via INTRAMUSCULAR

## 2012-03-16 MED ORDER — METHYLPREDNISOLONE ACETATE 80 MG/ML IJ SUSP
80.0000 mg | Freq: Once | INTRAMUSCULAR | Status: AC
  Administered 2012-03-16: 80 mg via INTRAMUSCULAR

## 2012-03-16 NOTE — Progress Notes (Signed)
  Subjective:    Patient ID: [name], female    DOB: 1929-04-06, 78 y.o.   MRN: [id number]  HPI The PT is here for follow up and re-evaluation of chronic medical conditions, medication management and review of any available recent lab and radiology data.  Preventive health is updated, specifically  Cancer screening and Immunization.   Questions or concerns regarding consultations or procedures which the PT has had in the interim are  addressed. The PT denies any adverse reactions to current medications since the last visit.  C/o 2 week h/o increased left shoulder, no specific aggravating trauma, requests injections in office    Review of Systems See HPI Denies recent fever or chills. Denies sinus pressure, nasal congestion, ear pain or sore throat. Denies chest congestion, productive cough or wheezing. Denies chest pains, palpitations and leg swelling Denies abdominal pain, nausea, vomiting, or constipation.  Finds probiotics and coconut helps her diarreah Denies dysuria, frequency, hesitancy or incontinence.  Denies headaches, seizures, numbness, or tingling. Denies   Depression,uncontrolled  anxiety or insomnia. Denies skin break down or rash.        Objective:   Physical Exam Patient alert and oriented and in no cardiopulmonary distress.  HEENT: No facial asymmetry, EOMI, no sinus tenderness,  oropharynx pink and moist.  Neck supple no adenopathy.  Chest: Clear to auscultation bilaterally.  CVS: S1, S2 no murmurs, no S3.  ABD: Soft non tender. Bowel sounds normal.  Ext: No edema  MS: Adequate ROM spine,  hips and knees.Decreased ROM left shoulder  Skin: Intact, no ulcerations or rash noted.  Psych: Good eye contact, normal affect. Memory intact not anxious or depressed appearing.  CNS: CN 2-12 intact, power, tone and sensation normal throughout.        Assessment & Plan:

## 2012-03-16 NOTE — Patient Instructions (Addendum)
Annual wellness in 4.5 month  Blood pressure and labs are excellent,no med change  Continue a diet with a lot of fruit and vegetable  Toradol 60mg  and depo medrol 80mg  IM today for left shoulder pain  Alleve one tablet once daily, as needed , for arthritis pain is OK when you need it

## 2012-03-21 NOTE — Assessment & Plan Note (Signed)
Increased and uncontrolled pain, anti inflammatories in office

## 2012-03-21 NOTE — Assessment & Plan Note (Signed)
Controlled, no change in medication  

## 2012-03-21 NOTE — Assessment & Plan Note (Signed)
Excellent and corrected with change in diet

## 2012-03-21 NOTE — Assessment & Plan Note (Signed)
Controlled, no change in medication DASH diet and commitment to daily physical activity for a minimum of 30 minutes discussed and encouraged, as a part of hypertension management. The importance of attaining a healthy weight is also discussed.  

## 2012-03-21 NOTE — Assessment & Plan Note (Signed)
Improved and controlled with clonidine

## 2012-03-21 NOTE — Assessment & Plan Note (Signed)
Improved with use of coconut twice daily, 1 teaspoon each time

## 2012-04-11 ENCOUNTER — Other Ambulatory Visit: Payer: Self-pay | Admitting: Family Medicine

## 2012-04-23 ENCOUNTER — Telehealth: Payer: Self-pay | Admitting: Family Medicine

## 2012-04-23 NOTE — Telephone Encounter (Signed)
Patient aware.

## 2012-04-23 NOTE — Telephone Encounter (Signed)
Advise her to take tylenol 1 twice daily and apply cool compresses, also benadryl 1 twice daily for 3 days. If worsens, come for work in on Monday, hopefully does not get infected,( should not)

## 2012-05-04 ENCOUNTER — Encounter: Payer: Self-pay | Admitting: Family Medicine

## 2012-05-04 ENCOUNTER — Ambulatory Visit (INDEPENDENT_AMBULATORY_CARE_PROVIDER_SITE_OTHER): Payer: Medicare Other | Admitting: Family Medicine

## 2012-05-04 VITALS — BP 140/72 | HR 56 | Resp 18 | Ht 63.0 in | Wt 167.1 lb

## 2012-05-04 DIAGNOSIS — F411 Generalized anxiety disorder: Secondary | ICD-10-CM

## 2012-05-04 DIAGNOSIS — M150 Primary generalized (osteo)arthritis: Secondary | ICD-10-CM

## 2012-05-04 DIAGNOSIS — I10 Essential (primary) hypertension: Secondary | ICD-10-CM

## 2012-05-04 DIAGNOSIS — M159 Polyosteoarthritis, unspecified: Secondary | ICD-10-CM

## 2012-05-04 DIAGNOSIS — F419 Anxiety disorder, unspecified: Secondary | ICD-10-CM

## 2012-05-04 NOTE — Progress Notes (Signed)
  Subjective:    Patient ID: [name], female    DOB: 1929/01/20, 78 y.o.   MRN: [id number]  HPI The PT is here for follow up and re-evaluation of chronic medical conditions, medication management and review of any available recent lab and radiology data.  Preventive health is updated, specifically  Cancer screening and Immunization.   Questions or concerns regarding consultations or procedures which the PT has had in the interim are  addressed. The PT denies any adverse reactions to current medications since the last visit.  3 week h/o increased back pain , worse this week, low, non radiating, no lower extremity weakness, numbness or incontinence of stool or urine. N falls in the past 12 month Occasional right ankle pain esp when just rising up for 6 months  Stiffness in fingers intermittently, mostly at night      Review of Systems See HPI Denies recent fever or chills. Denies sinus pressure, nasal congestion, ear pain or sore throat. Denies chest congestion, productive cough or wheezing. Denies chest pains, palpitations and leg swelling Denies abdominal pain, nausea, vomiting,diarrhea or constipation.   Denies dysuria, frequency, hesitancy or incontinence. Denies headaches, seizures, numbness, or tingling. Denies depression,uncontrolled  anxiety or insomnia. Denies skin break down or rash.        Objective:   Physical Exam  Patient alert and oriented and in no cardiopulmonary distress.  HEENT: No facial asymmetry, EOMI, no sinus tenderness,  oropharynx pink and moist.  Neck adeqaute ROM no adenopathy.  Chest: Clear to auscultation bilaterally.  CVS: S1, S2 no murmurs, no S3.  ABD: Soft non tender. Bowel sounds normal.  Ext: No edema  MS: Adequate though slightly reduced  ROM spine,adequate in  shoulders, hips and knees.  Skin: Intact, no ulcerations or rash noted.  Psych: Good eye contact, normal affect. Memory intact not anxious or depressed  appearing.  CNS: CN 2-12 intact, power, tone and sensation normal throughout.       Assessment & Plan:

## 2012-05-04 NOTE — Patient Instructions (Addendum)
F/u in 4 month  Call if you need me before.  Back pain is due to arthritis   No changes in medication

## 2012-05-12 DIAGNOSIS — M150 Primary generalized (osteo)arthritis: Secondary | ICD-10-CM | POA: Insufficient documentation

## 2012-05-12 NOTE — Assessment & Plan Note (Signed)
Generalized mild to moderate arthritis, not disabling , not limiting activity, and not putting pt at increased fall risk . Discussed the fact thsat she does not qulify for handicap sticker, and she is accepting of this

## 2012-05-12 NOTE — Assessment & Plan Note (Signed)
Adequate though sub optimal control No med change DASH diet and commitment to daily physical activity for a minimum of 30 minutes discussed and encouraged, as a part of hypertension management. The importance of attaining a healthy weight is also discussed.  

## 2012-05-12 NOTE — Assessment & Plan Note (Signed)
Controlled, no change in medication  

## 2012-05-24 ENCOUNTER — Ambulatory Visit (INDEPENDENT_AMBULATORY_CARE_PROVIDER_SITE_OTHER): Payer: Medicare Other | Admitting: Family Medicine

## 2012-05-24 ENCOUNTER — Ambulatory Visit (HOSPITAL_COMMUNITY)
Admission: RE | Admit: 2012-05-24 | Discharge: 2012-05-24 | Disposition: A | Discharge status: Home / Self Care | Payer: Medicare Other | Source: Ambulatory Visit | Attending: Family Medicine | Admitting: Family Medicine

## 2012-05-24 ENCOUNTER — Encounter: Payer: Self-pay | Admitting: Family Medicine

## 2012-05-24 VITALS — BP 142/80 | HR 65 | Resp 16 | Ht 63.0 in | Wt 167.1 lb

## 2012-05-24 DIAGNOSIS — R262 Difficulty in walking, not elsewhere classified: Secondary | ICD-10-CM

## 2012-05-24 DIAGNOSIS — M51379 Other intervertebral disc degeneration, lumbosacral region without mention of lumbar back pain or lower extremity pain: Secondary | ICD-10-CM | POA: Insufficient documentation

## 2012-05-24 DIAGNOSIS — I10 Essential (primary) hypertension: Secondary | ICD-10-CM | POA: Diagnosis not present

## 2012-05-24 DIAGNOSIS — M545 Low back pain, unspecified: Secondary | ICD-10-CM | POA: Diagnosis not present

## 2012-05-24 DIAGNOSIS — M549 Dorsalgia, unspecified: Secondary | ICD-10-CM

## 2012-05-24 DIAGNOSIS — M159 Polyosteoarthritis, unspecified: Secondary | ICD-10-CM

## 2012-05-24 DIAGNOSIS — M5137 Other intervertebral disc degeneration, lumbosacral region: Secondary | ICD-10-CM | POA: Diagnosis not present

## 2012-05-24 DIAGNOSIS — M150 Primary generalized (osteo)arthritis: Secondary | ICD-10-CM

## 2012-05-24 MED ORDER — METHYLPREDNISOLONE ACETATE 80 MG/ML IJ SUSP
80.0000 mg | Freq: Once | INTRAMUSCULAR | Status: AC
  Administered 2012-05-24: 80 mg via INTRAMUSCULAR

## 2012-05-24 MED ORDER — PREDNISONE 5 MG PO TABS: 5.0000 mg | ORAL_TABLET | Freq: Two times a day (BID) | ORAL | Status: DC

## 2012-05-24 MED ORDER — KETOROLAC TROMETHAMINE 60 MG/2ML IM SOLN
60.0000 mg | Freq: Once | INTRAMUSCULAR | Status: AC
  Administered 2012-05-24: 60 mg via INTRAMUSCULAR

## 2012-05-24 NOTE — Patient Instructions (Addendum)
F/u as before.Call if you need me before The pain in your leg is coming from arthritis in your back  Toradol 60mg , and depo medrol 80mg  Im are administered in the office today.  Prednisone for 5 days is prescribed.Do NOT take alleve for the next 7 days Use tylenol ONE at night for pain if needed.  You need xray of your back today.  You are referred to physical therapy.  Handicap sticker today.  Use cane to assist with walking till physical therapy has evalaueted you since you feel unsteady on your feet.

## 2012-05-24 NOTE — Progress Notes (Signed)
  Subjective:    Patient ID: [name], female    DOB: 1929-04-25, 79 y.o.   MRN: [id number]  HPI  5 day h/o left calf pain from ankle to calf, creepy crawly sensations, feels as though she will fall and ambiulating with a walker.First episode Also increased left shoulder pain, no inciting trauma noted. Denies urinary symptoms , fever or chills  Review of Systems See HPI Denies recent fever or chills. Denies sinus pressure, nasal congestion, ear pain or sore throat. Denies chest congestion, productive cough or wheezing. Denies chest pains, palpitations and leg swelling Denies abdominal pain, nausea, vomiting,diarrhea or constipation.   Denies dysuria, frequency, hesitancy or incontinence. Denies headaches, seizures, numbness, or tingling. Denies depression, anxiety or insomnia. Denies skin break down or rash.        Objective:   Physical Exam Patient alert and oriented and in no cardiopulmonary distress.Pt in pai and with increased anxiety, has a cane using  HEENT: No facial asymmetry, EOMI, no sinus tenderness,  oropharynx pink and moist.  Neck supple no adenopathy.  Chest: Clear to auscultation bilaterally.  CVS: S1, S2 no murmurs, no S3.  ABD: Soft non tender. Bowel sounds normal.  Ext: No edema  MS: decreased e ROM spine and left , shoulder, also left hip   Skin: Intact, no ulcerations or rash noted.  Psych: Good eye contact, normal affect. Memory intact not anxious or depressed appearing.  CNS: CN 2-12 intact, power, tone and sensation normal throughout.        Assessment & Plan:

## 2012-05-25 ENCOUNTER — Other Ambulatory Visit: Payer: Self-pay

## 2012-05-25 DIAGNOSIS — M549 Dorsalgia, unspecified: Secondary | ICD-10-CM

## 2012-05-25 MED ORDER — PREDNISONE 5 MG PO TABS: 5.0000 mg | ORAL_TABLET | Freq: Two times a day (BID) | ORAL | Status: AC

## 2012-05-27 ENCOUNTER — Ambulatory Visit (HOSPITAL_COMMUNITY)
Admission: RE | Admit: 2012-05-27 | Discharge: 2012-05-27 | Disposition: A | Discharge status: Home / Self Care | Payer: Medicare Other | Source: Ambulatory Visit | Attending: Family Medicine | Admitting: Family Medicine

## 2012-05-27 DIAGNOSIS — M25579 Pain in unspecified ankle and joints of unspecified foot: Secondary | ICD-10-CM | POA: Insufficient documentation

## 2012-05-27 DIAGNOSIS — IMO0001 Reserved for inherently not codable concepts without codable children: Secondary | ICD-10-CM | POA: Diagnosis not present

## 2012-05-27 DIAGNOSIS — R262 Difficulty in walking, not elsewhere classified: Secondary | ICD-10-CM | POA: Diagnosis not present

## 2012-05-27 DIAGNOSIS — M6281 Muscle weakness (generalized): Secondary | ICD-10-CM | POA: Insufficient documentation

## 2012-05-27 DIAGNOSIS — I10 Essential (primary) hypertension: Secondary | ICD-10-CM | POA: Insufficient documentation

## 2012-05-27 DIAGNOSIS — M549 Dorsalgia, unspecified: Secondary | ICD-10-CM | POA: Diagnosis not present

## 2012-05-27 DIAGNOSIS — M25519 Pain in unspecified shoulder: Secondary | ICD-10-CM | POA: Diagnosis not present

## 2012-05-27 DIAGNOSIS — R29898 Other symptoms and signs involving the musculoskeletal system: Secondary | ICD-10-CM | POA: Insufficient documentation

## 2012-05-27 NOTE — Evaluation (Addendum)
Physical Therapy Evaluation  Patient Details  Name: [name] MRN: [id number] Date of Birth: 10-Feb-1929 Charge:  Rhys Martini; IP tx Today's Date: 05/27/2012 Time: 4098-1191 PT Time Calculation (min): 73 min  Visit#: 1  of 12   Re-eval: 06/26/12 Assessment Diagnosis: radicular back pain Next MD Visit: 07/10/2012 Prior Therapy: none  Authorization: medicare  Authorization Visit#: 1  of 10    Past Medical History:  Past Medical History  Diagnosis Date  . Hypertension 20 years   . Chronic diarrhea   . Anxiety   . Depression   . Kidney stone     hx/ crushed   . Hx of adenomatous colonic polyps     tubular adenomas, last found in 2008  . GERD (gastroesophageal reflux disease)   . Erosive esophagitis   . Schatzki's ring 08/26/10    Last dilated on EGD by Dr. Ronni Rumble [facility], linear gastric erosions, BI hemigastrectomy  . Hyperplastic colon polyp 03/19/10    tcs by Dr. Jena Gauss   Past Surgical History:  Past Surgical History  Procedure Date  . Stomach ulcer 50 years ago     had some of her stomach removed   . Cholecystectomy 30 years ago  . Breast lumpectomy   . Bunion removal     from both feet   . Left shoulder surgery 2009    DR HARRISON  . Colonoscopy 03/19/2010    DR Jena Gauss,, normal TI, pancolonic diverticula, random colon bx neg., hyperplastic polyps removed  . Esophagogastroduodenoscopy 11/22/2003    DR Jena Gauss, erosive RE, [place] I  . Esophagogastroduodenoscopy 08/26/10    Dr. Jena Gauss- gastric xanthelasma/Schatzki's ring status post dilation    Subjective Symptoms/Limitations Symptoms: Ms. Riggle is being referred to therapy for unsteady gait.  She states that she contributes her walking problems to her back pain two or three weeks ago but then her left leg started bothering her to the point where she begain using a quad cane.  She states that her leg feels like it is going to give way on her.  How long can you sit comfortably?: Pt states sitting depends on how her back  is feeling; sitting does not seem to affect one way or another. How long can you stand comfortably?: unable to stand greater than a few minutes How long can you walk comfortably?: Pt has limited walking and is now walking with a quad cane.  She is only able to walk for five- ten minutes or less.   Was walkiing for exercise witnout the cane  Pain Assessment Currently in Pain?: Yes Pain Score:   4 (worst is a 10/10) Pain Location: Calf Pain Orientation: Left Pain Radiating Towards: to toes  Pain Onset: 1 to 4 weeks ago Pain Frequency: Intermittent Multiple Pain Sites: Yes    Prior Functioning  Home Living Lives With: Alone Prior Function Leisure: Hobbies-yes (Comment) Comments: walk; cards  Cognition/Observation Cognition Overall Cognitive Status: Appears within functional limits for tasks assessed  Sensation/Coordination/Flexibility/Functional Tests Functional Tests Functional Tests: Oswestry 26/50  Assessment RLE Strength Right Hip Flexion: 5/5 Right Hip Extension: 3/5 Right Hip ABduction: 5/5 Right Hip ADduction: 5/5 Right Knee Flexion: 5/5 Right Knee Extension: 5/5 Right Ankle Dorsiflexion: 4/5 LLE Strength Left Hip Flexion: 3+/5 Left Hip Extension: 3/5 Left Hip ABduction: 3+/5 Left Knee Flexion: 3+/5 Left Knee Extension: 5/5 Left Ankle Dorsiflexion: 5/5 Lumbar AROM Lumbar Flexion: wnl increased pain going down Lumbar Extension: wnl worsening Lumbar - Right Side Bend: decreased 20% with worsening of sx Lumbar - Left  Side Bend: decreased 20% no change Lumbar - Right Rotation: decreased 20% Lumbar - Left Rotation: wnl  Exercise/Treatments Mobility/Balance  Posture/Postural Control Posture/Postural Control: Postural limitations Postural Limitations: flat back   Stretches Active Hamstring Stretch: 3 reps;30 seconds Single Knee to Chest Stretch: 3 reps;30 seconds   Supine Ab Set: 10 reps Bridge: 10 reps   Modalities Modalities: Traction Traction Type  of Traction: Lumbar Min (lbs): 40 Max (lbs): 70 Hold Time: 45 Rest Time: 15 Time: 15  Physical Therapy Assessment and Plan PT Assessment and Plan Clinical Impression Statement: Pt with signs and sx of posterior derangement who will benefit from skilled physical therapy to return pt to previous functioning level.   Pt will benefit from skilled therapeutic intervention in order to improve on the following deficits: Decreased activity tolerance;Decreased range of motion;Difficulty walking;Decreased strength;Impaired flexibility;Pain Rehab Potential: Good PT Frequency: Min 2X/week PT Duration: 6 weeks PT Treatment/Interventions: Therapeutic activities;Therapeutic exercise;Modalities;Manual techniques;Patient/family education PT Plan: begin education on body mechanics(sit to supine; supine to sit); bent knee lift; clam, SL abduction, TM; 3rd session begin t-band for postrual; 4th begin prone exercises.5th begin heelraise and squats    Goals Home Exercise Program Pt will Perform Home Exercise Program: Independently PT Short Term Goals Time to Complete Short Term Goals: 2 weeks PT Short Term Goal 1: Pt to be able to walk in her house without the quad cane PT Short Term Goal 2: Pt strength to increase 1/2 grade for the above to occur PT Short Term Goal 3: Pt Pain in LE to be no greater than a 5/10; back no greater than a 2/10 PT Short Term Goal 4: Pt to be walking for 15 minutes. PT Long Term Goals Time to Complete Long Term Goals: 4 weeks PT Long Term Goal 1: No pain in the leg; Pain in the back to be no greater than a 1 80% of the time. PT Long Term Goal 2: Pt no longer to be using a quad cane to walk with. Long Term Goal 3: Pt strength to be increased one grade to allow the above to occur Long Term Goal 4: I in advance HEP PT Long Term Goal 5: oswestry to be improved by 10 points Additional PT Long Term Goals?: Yes PT Long Term Goal 6: Pt to be able to sleep through the night.  Problem  List Patient Active Problem List  Diagnosis  . OVERWEIGHT  . ESSENTIAL HYPERTENSION  . DERMATITIS  . OSTEOARTHROSIS, LOCAL, PRIMARY, SHOULDER  . DEGENERATIVE JOINT DISEASE, CERVICAL SPINE  . RUPTURE ROTATOR CUFF  . DIARRHEA  . EAR PAIN, LEFT  . ALLERGIC RHINITIS, SEASONAL  . TREMOR  . GERD (gastroesophageal reflux disease)  . Odynophagia  . Esophageal dysphagia  . Chronic diarrhea  . Unstable balance  . H/O hyperlipidemia  . Anxiety  . Primary generalized (osteo)arthritis  . Back pain with radiation  . Weakness of left leg  . Difficulty in walking    PT - End of Session Activity Tolerance: Patient tolerated treatment well General Behavior During Session: [facility] for tasks performed Cognition: [facility] for tasks performed PT Plan of Care PT Home Exercise Plan: given  GP Functional Assessment Tool Used: oswestry Functional Limitation: Mobility: Walking and moving around Mobility: Walking and Moving Around Current Status (Z6109): At least 40 percent but less than 60 percent impaired, limited or restricted Mobility: Walking and Moving Around Goal Status [phone]): At least 1 percent but less than 20 percent impaired, limited or restricted  RUSSELL,[name] 05/27/2012, 9:25  AM  Physician Documentation Your signature is required to indicate approval of the treatment plan as stated above.  Please sign and either send electronically or make a copy of this report for your files and return this physician signed original.   Please mark one 1.__approve of plan  2. ___approve of plan with the following conditions.   ______________________________                                                          _____________________ Physician Signature                                                                                                             Date

## 2012-05-31 NOTE — Assessment & Plan Note (Signed)
Pt to eval and assist with improvement. Handicap sticker provided

## 2012-05-31 NOTE — Assessment & Plan Note (Signed)
Controlled, no change in medication DASH diet and commitment to daily physical activity for a minimum of 30 minutes discussed and encouraged, as a part of hypertension management. The importance of attaining a healthy weight is also discussed.  

## 2012-05-31 NOTE — Assessment & Plan Note (Signed)
Uncontrolled with flare in symptoms espescially related to spinal disease. Anti inflammatories also PT eval

## 2012-05-31 NOTE — Assessment & Plan Note (Signed)
Refer to pT for chronic pain management through strengthening exeercises

## 2012-06-01 ENCOUNTER — Ambulatory Visit (HOSPITAL_COMMUNITY)
Admission: RE | Admit: 2012-06-01 | Discharge: 2012-06-01 | Disposition: A | Discharge status: Home / Self Care | Payer: Medicare Other | Source: Ambulatory Visit | Attending: Family Medicine | Admitting: Family Medicine

## 2012-06-01 DIAGNOSIS — M6281 Muscle weakness (generalized): Secondary | ICD-10-CM | POA: Diagnosis not present

## 2012-06-01 DIAGNOSIS — M549 Dorsalgia, unspecified: Secondary | ICD-10-CM | POA: Diagnosis not present

## 2012-06-01 DIAGNOSIS — IMO0001 Reserved for inherently not codable concepts without codable children: Secondary | ICD-10-CM | POA: Diagnosis not present

## 2012-06-01 DIAGNOSIS — M25519 Pain in unspecified shoulder: Secondary | ICD-10-CM | POA: Diagnosis not present

## 2012-06-01 DIAGNOSIS — R29898 Other symptoms and signs involving the musculoskeletal system: Secondary | ICD-10-CM

## 2012-06-01 DIAGNOSIS — M25579 Pain in unspecified ankle and joints of unspecified foot: Secondary | ICD-10-CM | POA: Diagnosis not present

## 2012-06-01 DIAGNOSIS — R262 Difficulty in walking, not elsewhere classified: Secondary | ICD-10-CM | POA: Diagnosis not present

## 2012-06-01 NOTE — Progress Notes (Addendum)
Physical Therapy Treatment Patient Details  Name: [name] MRN: [id number] Date of Birth: Sep 24, 1928 Charge:  There ex 29; IP tx Today's Date: 06/01/2012 Time: 1478-2956 PT Time Calculation (min): 53 min  Visit#: 2  of 12   Re-eval: 06/25/12   Authorization: medicare  Authorization Visit#: 2  of 10    Subjective: Symptoms/Limitations Symptoms: Pt states that she feels that the traction really helped her pain.  Pt states she is still having her shoulder pain however.  Exercise/Treatments  Stretches Active Hamstring Stretch: 3 reps;30 seconds Single Knee to Chest Stretch: 3 reps;30 seconds  Supine Clam: 10 reps Bent Knee Raise: 10 reps Bridge: 10 reps Sidelying Hip Abduction: 10 reps   Modalities Modalities: Traction Traction Type of Traction: Lumbar Min (lbs): 50 Max (lbs): 75 Hold Time: 45 Rest Time: 15 Time: 15  Physical Therapy Assessment and Plan PT Assessment and Plan Clinical Impression Statement: Reviewed proper way to go from sit to supine and supine to sit with multiple cuing needed for pt to complete this task correctly.  Pt able to complete stabilization exercises with proper technique once verballly cued; Added ex per flowsheet.  No soreness following traction therefore max and min poundage was raised. PT Plan: begin prone exercise     Goals    Problem List Patient Active Problem List  Diagnosis  . OVERWEIGHT  . ESSENTIAL HYPERTENSION  . DERMATITIS  . OSTEOARTHROSIS, LOCAL, PRIMARY, SHOULDER  . DEGENERATIVE JOINT DISEASE, CERVICAL SPINE  . RUPTURE ROTATOR CUFF  . DIARRHEA  . EAR PAIN, LEFT  . ALLERGIC RHINITIS, SEASONAL  . TREMOR  . GERD (gastroesophageal reflux disease)  . Odynophagia  . Esophageal dysphagia  . Chronic diarrhea  . Unstable balance  . H/O hyperlipidemia  . Anxiety  . Primary generalized (osteo)arthritis  . Back pain with radiation  . Weakness of left leg  . Difficulty in walking    PT - End of  Session Activity Tolerance: Patient tolerated treatment well  GP    RUSSELL,[name] 06/01/2012, 2:18 PM

## 2012-06-03 ENCOUNTER — Ambulatory Visit (HOSPITAL_COMMUNITY)
Admission: RE | Admit: 2012-06-03 | Discharge: 2012-06-03 | Disposition: A | Discharge status: Home / Self Care | Payer: Medicare Other | Source: Ambulatory Visit | Attending: Family Medicine | Admitting: Family Medicine

## 2012-06-03 DIAGNOSIS — R262 Difficulty in walking, not elsewhere classified: Secondary | ICD-10-CM | POA: Diagnosis not present

## 2012-06-03 DIAGNOSIS — IMO0001 Reserved for inherently not codable concepts without codable children: Secondary | ICD-10-CM | POA: Diagnosis not present

## 2012-06-03 DIAGNOSIS — M25579 Pain in unspecified ankle and joints of unspecified foot: Secondary | ICD-10-CM | POA: Diagnosis not present

## 2012-06-03 DIAGNOSIS — M549 Dorsalgia, unspecified: Secondary | ICD-10-CM | POA: Diagnosis not present

## 2012-06-03 DIAGNOSIS — M25519 Pain in unspecified shoulder: Secondary | ICD-10-CM | POA: Diagnosis not present

## 2012-06-03 DIAGNOSIS — M6281 Muscle weakness (generalized): Secondary | ICD-10-CM | POA: Diagnosis not present

## 2012-06-03 NOTE — Progress Notes (Signed)
Physical Therapy Treatment Patient Details  Name: [name] MRN: [id number] Date of Birth: 25-Jun-1928  Today's Date: 06/03/2012 Time: 0933-1020 PT Time Calculation (min): 47 min  Visit#: 3  of 12   Re-eval: 06/25/12 Charges: Therex x 25' Mechanical traction x 15'  Authorization: medicare  Authorization Visit#: 3  of 10    Subjective: Symptoms/Limitations Symptoms: Pt continues to have shoulder pain. Pt states traction is really helping. Pain Assessment Currently in Pain?: Yes Pain Score:   4 Pain Location: Ankle Pain Orientation: Left   Exercise/Treatments Supine Ab Set: 10 reps;5 seconds Clam: 10 reps Bent Knee Raise: 10 reps Bridge: 10 reps Sidelying Hip Abduction: 10 reps Prone  Straight Leg Raise: 10 reps  Modalities Modalities: Traction Traction Type of Traction: Lumbar Min (lbs): 50 Max (lbs): 75 Hold Time: 45 Rest Time: 15 Time: 15  Physical Therapy Assessment and Plan PT Assessment and Plan Clinical Impression Statement: Pt completes stabilization exercises with good control after initial cueing. Began prone SLR to improve glute strength. Mechanical lumbar traction completed again this session secondary to decreased radicular sx with previous tx. Pt reports 0/10 pain at end of session. PT Plan: Continue to progress core strength and decreased pain per PT POC.     Problem List Patient Active Problem List  Diagnosis  . OVERWEIGHT  . ESSENTIAL HYPERTENSION  . DERMATITIS  . OSTEOARTHROSIS, LOCAL, PRIMARY, SHOULDER  . DEGENERATIVE JOINT DISEASE, CERVICAL SPINE  . RUPTURE ROTATOR CUFF  . DIARRHEA  . EAR PAIN, LEFT  . ALLERGIC RHINITIS, SEASONAL  . TREMOR  . GERD (gastroesophageal reflux disease)  . Odynophagia  . Esophageal dysphagia  . Chronic diarrhea  . Unstable balance  . H/O hyperlipidemia  . Anxiety  . Primary generalized (osteo)arthritis  . Back pain with radiation  . Weakness of left leg  . Difficulty in walking    PT - End  of Session Activity Tolerance: Patient tolerated treatment well General Behavior During Session: [facility] for tasks performed Cognition: [facility] for tasks performed  Seth Bake, PTA 06/03/2012, 10:27 AM

## 2012-06-04 ENCOUNTER — Telehealth: Payer: Self-pay | Admitting: Family Medicine

## 2012-06-04 NOTE — Telephone Encounter (Signed)
Tried to call pt back, no answer.

## 2012-06-08 ENCOUNTER — Ambulatory Visit (HOSPITAL_COMMUNITY)
Admission: RE | Admit: 2012-06-08 | Discharge: 2012-06-08 | Disposition: A | Discharge status: Home / Self Care | Payer: Medicare Other | Source: Ambulatory Visit | Attending: Family Medicine | Admitting: Family Medicine

## 2012-06-08 DIAGNOSIS — R262 Difficulty in walking, not elsewhere classified: Secondary | ICD-10-CM | POA: Diagnosis not present

## 2012-06-08 DIAGNOSIS — IMO0001 Reserved for inherently not codable concepts without codable children: Secondary | ICD-10-CM | POA: Diagnosis not present

## 2012-06-08 DIAGNOSIS — M25519 Pain in unspecified shoulder: Secondary | ICD-10-CM | POA: Diagnosis not present

## 2012-06-08 DIAGNOSIS — M6281 Muscle weakness (generalized): Secondary | ICD-10-CM | POA: Diagnosis not present

## 2012-06-08 DIAGNOSIS — M25579 Pain in unspecified ankle and joints of unspecified foot: Secondary | ICD-10-CM | POA: Diagnosis not present

## 2012-06-08 DIAGNOSIS — M549 Dorsalgia, unspecified: Secondary | ICD-10-CM | POA: Diagnosis not present

## 2012-06-08 DIAGNOSIS — R29898 Other symptoms and signs involving the musculoskeletal system: Secondary | ICD-10-CM

## 2012-06-08 NOTE — Progress Notes (Signed)
Physical Therapy Treatment Patient Details  Name: [name] MRN: [id number] Date of Birth: Nov 05, 1928  Today's Date: 06/08/2012 Time: 4098-1191 PT Time Calculation (min): 58 min  Visit#: 4  of 12   Re-eval: 06/25/12  charge:  There  Ex x 36; IPTX  Authorization: medicare  Authorization Visit#: 4  of 10    Subjective: Symptoms/Limitations Symptoms: I walked a little yesterday for about ten minutes yesterday and I did not have any pain. Pain Assessment Pain Score:   6 Pain Location: Ankle Pain Orientation: Left  Exercise/Treatments   Stretches Active Hamstring Stretch: 3 reps;30 seconds Single Knee to Chest Stretch: 3 reps;30 seconds   Supine Bridge: 15 reps Straight Leg Raise: 5 reps   Prone  Single Arm Raise: 5 reps Straight Leg Raise: 10 reps Opposite Arm/Leg Raise: 10 reps Other Prone Lumbar Exercises: POE f/b press up 5x x 2  Modalities Modalities: Traction Traction Min (lbs): 45 Max (lbs): 60 Hold Time: 45 Rest Time: 15  Physical Therapy Assessment and Plan PT Assessment and Plan Clinical Impression Statement: Pt states she is not having any hip pain only radicular pain therefore no piriformis stretch but added POE going into press up.  Pt needed verbal and manual assist to complete opposite arm/leg raise with correct technique.   PT Plan: begin wall squats to improve leg strength.    Goals Home Exercise Program PT Goal: Perform Home Exercise Program - Progress: Met PT Short Term Goals PT Short Term Goal 1 - Progress: Met PT Short Term Goal 2 - Progress: Met PT Short Term Goal 3 - Progress: Progressing toward goal PT Short Term Goal 4 - Progress: Progressing toward goal PT Long Term Goals PT Long Term Goal 1 - Progress: Progressing toward goal PT Long Term Goal 2 - Progress: Progressing toward goal Long Term Goal 4 Progress: Met Long Term Goal 5 Progress: Progressing toward goal  Problem List Patient Active Problem List  Diagnosis  .  OVERWEIGHT  . ESSENTIAL HYPERTENSION  . DERMATITIS  . OSTEOARTHROSIS, LOCAL, PRIMARY, SHOULDER  . DEGENERATIVE JOINT DISEASE, CERVICAL SPINE  . RUPTURE ROTATOR CUFF  . DIARRHEA  . EAR PAIN, LEFT  . ALLERGIC RHINITIS, SEASONAL  . TREMOR  . GERD (gastroesophageal reflux disease)  . Odynophagia  . Esophageal dysphagia  . Chronic diarrhea  . Unstable balance  . H/O hyperlipidemia  . Anxiety  . Primary generalized (osteo)arthritis  . Back pain with radiation  . Weakness of left leg  . Difficulty in walking    PT - End of Session Activity Tolerance: Patient tolerated treatment well General Behavior During Session: [facility] for tasks performed Cognition: [facility] for tasks performed  GP    [name],[name] 06/08/2012, 10:07 AM

## 2012-06-10 ENCOUNTER — Inpatient Hospital Stay (HOSPITAL_COMMUNITY): Admission: RE | Admit: 2012-06-10 | Payer: Medicare Other | Source: Ambulatory Visit | Admitting: Physical Therapy

## 2012-06-14 ENCOUNTER — Other Ambulatory Visit: Payer: Self-pay | Admitting: Family Medicine

## 2012-06-14 DIAGNOSIS — Z139 Encounter for screening, unspecified: Secondary | ICD-10-CM

## 2012-06-15 ENCOUNTER — Ambulatory Visit (HOSPITAL_COMMUNITY)
Admission: RE | Admit: 2012-06-15 | Discharge: 2012-06-15 | Disposition: A | Discharge status: Home / Self Care | Payer: Medicare Other | Source: Ambulatory Visit | Attending: Family Medicine | Admitting: Family Medicine

## 2012-06-15 DIAGNOSIS — I10 Essential (primary) hypertension: Secondary | ICD-10-CM | POA: Diagnosis not present

## 2012-06-15 DIAGNOSIS — IMO0001 Reserved for inherently not codable concepts without codable children: Secondary | ICD-10-CM | POA: Diagnosis not present

## 2012-06-15 DIAGNOSIS — M549 Dorsalgia, unspecified: Secondary | ICD-10-CM | POA: Insufficient documentation

## 2012-06-15 DIAGNOSIS — M25579 Pain in unspecified ankle and joints of unspecified foot: Secondary | ICD-10-CM | POA: Insufficient documentation

## 2012-06-15 DIAGNOSIS — M6281 Muscle weakness (generalized): Secondary | ICD-10-CM | POA: Insufficient documentation

## 2012-06-15 DIAGNOSIS — M25519 Pain in unspecified shoulder: Secondary | ICD-10-CM | POA: Insufficient documentation

## 2012-06-15 DIAGNOSIS — R262 Difficulty in walking, not elsewhere classified: Secondary | ICD-10-CM | POA: Insufficient documentation

## 2012-06-15 NOTE — Progress Notes (Signed)
Physical Therapy Treatment Patient Details  Name: [name] MRN: [id number] Date of Birth: 05/09/1929  Today's Date: 06/15/2012 Time: 4098-1191 PT Time Calculation (min): 49 min  Visit#: 5  of 12   Re-eval: 06/25/12 Authorization: medicare  Authorization Visit#: 5  of 10   Charges:  therex 28', traction X 1 unit  Subjective: Symptoms/Limitations Symptoms: Pt. states she continues to walk for exercises.  States her pain is overall decreasing and currently has none except in her L ankle.  No LBP today.  States the traction is working well to decrease her pain. Pain Assessment Currently in Pain?: Yes Pain Score:   3 Pain Location: Ankle Pain Orientation: Left Pain Radiating Towards: radicular pain up toward her L knee from her ankle.   Exercise/Treatments Stretches Active Hamstring Stretch: 3 reps;30 seconds Single Knee to Chest Stretch: 3 reps;30 seconds Supine Bridge: 15 reps Straight Leg Raise: 10 reps Prone  Single Arm Raise: 5 reps Straight Leg Raise: 10 reps Opposite Arm/Leg Raise: 5 reps Other Prone Lumbar Exercises: POE f/b press up 5x x 2   Modalities Modalities: Traction Traction Min (lbs): 45 Max (lbs): 60 Hold Time: 45 Rest Time: 15 Time: 15  Physical Therapy Assessment and Plan PT Assessment and Plan Clinical Impression Statement: Pt. reports the "crawling" feeling down her leg is ggetting better and is having overall decreased symptoms.  Pt. requires tactile cues to perform exercises correctly for stability, no cues or instruction with stretches. PT Plan: begin wall squats to improve leg strength.  Continue to progress and with use of traction to decrease radiculopathy.     Problem List Patient Active Problem List  Diagnosis  . OVERWEIGHT  . ESSENTIAL HYPERTENSION  . DERMATITIS  . OSTEOARTHROSIS, LOCAL, PRIMARY, SHOULDER  . DEGENERATIVE JOINT DISEASE, CERVICAL SPINE  . RUPTURE ROTATOR CUFF  . DIARRHEA  . EAR PAIN, LEFT  . ALLERGIC  RHINITIS, SEASONAL  . TREMOR  . GERD (gastroesophageal reflux disease)  . Odynophagia  . Esophageal dysphagia  . Chronic diarrhea  . Unstable balance  . H/O hyperlipidemia  . Anxiety  . Primary generalized (osteo)arthritis  . Back pain with radiation  . Weakness of left leg  . Difficulty in walking    PT - End of Session Activity Tolerance: Patient tolerated treatment well General Behavior During Session: [facility] for tasks performed Cognition: [facility] for tasks performed   Lurena Nida, PTA/CLT 06/15/2012, 10:08 AM

## 2012-06-17 ENCOUNTER — Ambulatory Visit (HOSPITAL_COMMUNITY)
Admission: RE | Admit: 2012-06-17 | Discharge: 2012-06-17 | Disposition: A | Discharge status: Home / Self Care | Payer: Medicare Other | Source: Ambulatory Visit | Attending: Family Medicine | Admitting: Family Medicine

## 2012-06-17 DIAGNOSIS — R29898 Other symptoms and signs involving the musculoskeletal system: Secondary | ICD-10-CM

## 2012-06-17 NOTE — Evaluation (Signed)
Physical Therapy Evaluation  Patient Details  Name: [name] MRN: [id number] Date of Birth: January 12, 1929  Today's Date: 06/17/2012 Time: 0920-1025 PT Time Calculation (min): 65 min  Visit#: 6  of 12   Re-eval: 06/25/12    Authorization: medicare  Authorization Time Period:    Authorization Visit#: 6  of 10    Past Medical History:  Past Medical History  Diagnosis Date  . Hypertension 20 years   . Chronic diarrhea   . Anxiety   . Depression   . Kidney stone     hx/ crushed   . Hx of adenomatous colonic polyps     tubular adenomas, last found in 2008  . GERD (gastroesophageal reflux disease)   . Erosive esophagitis   . Schatzki's ring 08/26/10    Last dilated on EGD by Dr. Ronni Rumble [facility], linear gastric erosions, BI hemigastrectomy  . Hyperplastic colon polyp 03/19/10    tcs by Dr. Jena Gauss   Past Surgical History:  Past Surgical History  Procedure Date  . Stomach ulcer 50 years ago     had some of her stomach removed   . Cholecystectomy 30 years ago  . Breast lumpectomy   . Bunion removal     from both feet   . Left shoulder surgery 2009    DR HARRISON  . Colonoscopy 03/19/2010    DR Jena Gauss,, normal TI, pancolonic diverticula, random colon bx neg., hyperplastic polyps removed  . Esophagogastroduodenoscopy 11/22/2003    DR Jena Gauss, erosive RE, [place]roth I  . Esophagogastroduodenoscopy 08/26/10    Dr. Jena Gauss- gastric xanthelasma/Schatzki's ring status post dilation    Subjective Symptoms/Limitations Symptoms: Pt states that her shoulder is feeling better since she has been doing the exercise that the therapist gave her but she still is having pain.  Pt has new prescription to work on shoulder as well as her back.     Assessment LUE AROM (degrees) Overall AROM Left Upper Extremity: Within functional limits for tasks assessed LUE Strength Left Shoulder Flexion: 3+/5 Left Shoulder Extension: 3+/5 Left Shoulder ABduction: 3/5 Left Shoulder Internal Rotation:  4/5 Left Shoulder External Rotation: 3-/5  Exercise/Treatments  Stretches Passive Hamstring Stretch: 1 rep;60 seconds Standing Other Standing Lumbar Exercises: green t-band for shld flex,ext, AB; Red for ER x 10 Supine Bridge: 15 reps Straight Leg Raise: 10 reps;Limitations Straight Leg Raises Limitations: floating Sidelying Clam: 10 reps Hip Abduction: 10 reps Prone  Straight Leg Raise: 10 reps Opposite Arm/Leg Raise: 10 reps Other Prone Lumbar Exercises: press up x 10  Modalities Modalities: Traction Traction Min (lbs): 45 Max (lbs): 60 Hold Time: 45 Rest Time: 15 Time: 15  Physical Therapy Assessment and Plan PT Assessment and Plan Clinical Impression Statement: Pt leg pain is doing better but still occurs with prolong standing; standing greater than 10-15 minutes.   Pt continues to have L leg weakness but is progressing in strength Rehab Potential: Good PT Plan: new exercises to decrease shoulder pain given    Goals Home Exercise Program PT Goal: Perform Home Exercise Program - Progress: Met PT Short Term Goals PT Short Term Goal 1 - Progress: Met PT Short Term Goal 2 - Progress: Met PT Short Term Goal 3 - Progress: Progressing toward goal PT Short Term Goal 4 - Progress: Progressing toward goal PT Long Term Goals PT Long Term Goal 1 - Progress: Progressing toward goal PT Long Term Goal 2 - Progress: Met Long Term Goal 3 Progress: Progressing toward goal Long Term Goal 4 Progress: Met Long  Term Goal 5 Progress: Progressing toward goal PT Long Term Goal 6: Pt shoulder pain to be no greater than a 3/10 new goal as of 2/6  Problem List Patient Active Problem List  Diagnosis  . OVERWEIGHT  . ESSENTIAL HYPERTENSION  . DERMATITIS  . OSTEOARTHROSIS, LOCAL, PRIMARY, SHOULDER  . DEGENERATIVE JOINT DISEASE, CERVICAL SPINE  . RUPTURE ROTATOR CUFF  . DIARRHEA  . EAR PAIN, LEFT  . ALLERGIC RHINITIS, SEASONAL  . TREMOR  . GERD (gastroesophageal reflux disease)   . Odynophagia  . Esophageal dysphagia  . Chronic diarrhea  . Unstable balance  . H/O hyperlipidemia  . Anxiety  . Primary generalized (osteo)arthritis  . Back pain with radiation  . Weakness of left leg  . Difficulty in walking    PT - End of Session Activity Tolerance: Patient tolerated treatment well  GP    RUSSELL,[name] 06/17/2012, 12:26 PM  Physician Documentation Your signature is required to indicate approval of the treatment plan as stated above.  Please sign and either send electronically or make a copy of this report for your files and return this physician signed original.   Please mark one 1.__approve of plan  2. ___approve of plan with the following conditions.   ______________________________                                                          _____________________ Physician Signature                                                                                                             Date

## 2012-06-22 ENCOUNTER — Ambulatory Visit (HOSPITAL_COMMUNITY)
Admission: RE | Admit: 2012-06-22 | Discharge: 2012-06-22 | Disposition: A | Discharge status: Home / Self Care | Payer: Medicare Other | Source: Ambulatory Visit | Attending: Family Medicine | Admitting: Family Medicine

## 2012-06-22 NOTE — Progress Notes (Signed)
Physical Therapy Treatment Patient Details  Name: [name] MRN: [id number] Date of Birth: June 23, 1928  Today's Date: 06/22/2012 Time: 0920-1010 PT Time Calculation (min): 50 min  Visit#: 7 of 12  Re-eval: 06/25/12 Charges: Therex x 25' Traction x 1 unit  Authorization: medicare   Authorization Visit#: 7 of 10   Subjective: Symptoms/Limitations Symptoms: Pt reprots HEP compliance with lumbar and shoulder exercises. Pain Assessment Currently in Pain?: Yes Pain Score:   3 Pain Location: Ankle Pain Orientation: Left  Precautions/Restrictions     Exercise/Treatments Seated Other Seated Lumbar Exercises: SCapular retraction x 10; L ER with cueing for form x 10 Supine Bridge: 15 reps Straight Leg Raise: 10 reps;Limitations Straight Leg Raises Limitations: floating Sidelying Clam: 10 reps Hip Abduction: 10 reps Prone  Opposite Arm/Leg Raise: 10 reps Other Prone Lumbar Exercises: press up x 10  Modalities Modalities: Traction Traction Type of Traction: Lumbar Min (lbs): 45 Max (lbs): 60 Hold Time: 45 Rest Time: 15 Time: 15  Physical Therapy Assessment and Plan PT Assessment and Plan Clinical Impression Statement: Pt completes stabilization exercises well with good core contraction. Pt requires multimodal cueing to decrease speed with exercises. Traction completed again this session to decrease LE radicular sx. Pt reports no radicular sx and 2/10 shoulder pain at end of session. PT Plan: Continue to strengthen and decrease pain/radicular sx per PT POC.     Problem List Patient Active Problem List  Diagnosis  . OVERWEIGHT  . ESSENTIAL HYPERTENSION  . DERMATITIS  . OSTEOARTHROSIS, LOCAL, PRIMARY, SHOULDER  . DEGENERATIVE JOINT DISEASE, CERVICAL SPINE  . RUPTURE ROTATOR CUFF  . DIARRHEA  . EAR PAIN, LEFT  . ALLERGIC RHINITIS, SEASONAL  . TREMOR  . GERD (gastroesophageal reflux disease)  . Odynophagia  . Esophageal dysphagia  . Chronic diarrhea  .  Unstable balance  . H/O hyperlipidemia  . Anxiety  . Primary generalized (osteo)arthritis  . Back pain with radiation  . Weakness of left leg  . Difficulty in walking    PT - End of Session Activity Tolerance: Patient tolerated treatment well General Behavior During Session: [facility] for tasks performed Cognition: [facility] for tasks performed  Seth Bake, PTA  06/22/2012, 10:13 AM

## 2012-06-24 ENCOUNTER — Ambulatory Visit: Payer: Medicare Other | Admitting: Family Medicine

## 2012-06-24 ENCOUNTER — Ambulatory Visit (HOSPITAL_COMMUNITY): Payer: Medicare Other | Admitting: *Deleted

## 2012-06-29 ENCOUNTER — Ambulatory Visit (HOSPITAL_COMMUNITY)
Admission: RE | Admit: 2012-06-29 | Discharge: 2012-06-29 | Disposition: A | Discharge status: Home / Self Care | Payer: Medicare Other | Source: Ambulatory Visit | Attending: Family Medicine | Admitting: Family Medicine

## 2012-06-29 DIAGNOSIS — R29898 Other symptoms and signs involving the musculoskeletal system: Secondary | ICD-10-CM

## 2012-06-29 NOTE — Progress Notes (Signed)
Physical Therapy Treatment Patient Details  Name: Enza Shone MRN: [id number] Date of Birth: Aug 29, 1928  Today's Date: 06/29/2012 Time:830  - 935  charge: [place], tx, there ex x 15  Visit#: 8 of 12  Re-eval: 07/01/12    Authorization: medicare    Authorization Visit#: 8 of 10   Subjective: Symptoms/Limitations Symptoms: Pt reprots HEP compliance with lumbar and shoulder exercises.  States no back pain she just occasionally feels tingling along her ankle but this is not as often Pain Assessment Currently in Pain?: Yes Pain Score:   5 Pain Location: Shoulder Pain Orientation: Left Pain Type: Chronic pain    Exercise/Treatments     Stretches Active Hamstring Stretch: 3 reps;30 seconds Single Knee to Chest Stretch: 3 reps;30 seconds Standing Wall Slides: 10 reps Scapular Retraction: 10 reps;Theraband Theraband Level (Scapular Retraction): Level 3 (Green) Row: 10 reps;Theraband Theraband Level (Row): Level 3 (Green) Shoulder Extension: Strengthening;10 reps;Theraband Theraband Level (Shoulder Extension): Level 3 (Green) Other Standing Lumbar Exercises: B shld flex,ab against wall keeping lumbar still x 5   Prone  Straight Leg Raise: 10 reps Opposite Arm/Leg Raise: 10 reps Other Prone Lumbar Exercises: rows; B UE ER x10 @    Modalities Modalities: Ultrasound;Traction Ultrasound Ultrasound Location: L shoulder while ER to expose tendon Ultrasound Parameters: 1.3 w/cm2 x 8',  Traction Type of Traction: Lumbar Min (lbs): 45 Max (lbs): 65 Hold Time: 45 Rest Time: 15 Time: 15  Physical Therapy Assessment and Plan PT Assessment and Plan Clinical Impression Statement: Pt did well with verbal cuing for the new exercises.  Pt shoulder improved to 5/10 w/ exercises PT Plan: Continue to strengthen and decrease pain/radicular sx per PT POC.    Goals Home Exercise Program PT Goal: Perform Home Exercise Program - Progress: Met PT Short Term Goals PT Short Term  Goal 1: Pt to be able to walk in her house without the quad cane PT Short Term Goal 1 - Progress: Met PT Short Term Goal 2: Pt strength to increase 1/2 grade for the above to occur PT Short Term Goal 2 - Progress: Met PT Short Term Goal 3: Pt Pain in LE to be no greater than a 5/10; back no greater than a 2/10 PT Short Term Goal 3 - Progress: Met PT Short Term Goal 4: Pt to be walking for 15 minutes. PT Short Term Goal 4 - Progress: Met PT Long Term Goals PT Long Term Goal 1: No pain in the leg; Pain in the back to be no greater than a 1 80% of the time. PT Long Term Goal 1 - Progress: Progressing toward goal PT Long Term Goal 2: Pt no longer to be using a quad cane to walk with. PT Long Term Goal 2 - Progress: Met Long Term Goal 3: Pt strength to be increased one grade to allow the above to occur Long Term Goal 3 Progress: Met Long Term Goal 4: I in advance HEP Long Term Goal 4 Progress: Met PT Long Term Goal 5: oswestry to be improved by 10 points Long Term Goal 5 Progress: Progressing toward goal PT Long Term Goal 6: Pt shoulder pain to be no greater than a 3/10 new goal as of 2/6  Problem List Patient Active Problem List  Diagnosis  . OVERWEIGHT  . ESSENTIAL HYPERTENSION  . DERMATITIS  . OSTEOARTHROSIS, LOCAL, PRIMARY, SHOULDER  . DEGENERATIVE JOINT DISEASE, CERVICAL SPINE  . RUPTURE ROTATOR CUFF  . DIARRHEA  . EAR PAIN, LEFT  . ALLERGIC RHINITIS,  SEASONAL  . TREMOR  . GERD (gastroesophageal reflux disease)  . Odynophagia  . Esophageal dysphagia  . Chronic diarrhea  . Unstable balance  . H/O hyperlipidemia  . Anxiety  . Primary generalized (osteo)arthritis  . Back pain with radiation  . Weakness of left leg  . Difficulty in walking    PT - End of Session Activity Tolerance: Patient tolerated treatment well  GP Functional Assessment Tool Used: oswestry  RUSSELL,[name] 06/29/2012, 9:36 AM

## 2012-07-01 ENCOUNTER — Ambulatory Visit (HOSPITAL_COMMUNITY)
Admission: RE | Admit: 2012-07-01 | Discharge: 2012-07-01 | Disposition: A | Discharge status: Home / Self Care | Payer: Medicare Other | Source: Ambulatory Visit | Attending: Family Medicine | Admitting: Family Medicine

## 2012-07-01 DIAGNOSIS — R29898 Other symptoms and signs involving the musculoskeletal system: Secondary | ICD-10-CM

## 2012-07-01 NOTE — Progress Notes (Signed)
Physical Therapy Treatment Patient Details  Name: [name] MRN: [id number] Date of Birth: 22-May-1928  Today's Date: 07/01/2012 Time: 4098-1191 PT Time Calculation (min): 43 min  Visit#: 9 of 12  Re-eval: 07/01/12 Charges: Traction x 1 [place] x 8' Therex x 15'  Authorization: medicare  Authorization Visit#: 9 of 10   Subjective: Symptoms/Limitations Symptoms: Pt states that her back pain is not bad, only her leg pain. Pain Assessment Currently in Pain?: Yes Pain Score:   4 Pain Location: Leg Pain Orientation: Left   Exercise/Treatments Standing Wall Slides: 10 reps Scapular Retraction: 10 reps;Theraband Theraband Level (Scapular Retraction): Level 3 (Green) Row: 10 reps;Theraband Theraband Level (Row): Level 3 (Green) Shoulder Extension: Strengthening;10 reps;Theraband Theraband Level (Shoulder Extension): Level 3 (Green) Other Standing Lumbar Exercises: B shld flex,ab against wall keeping lumbar still x 5   Modalities Modalities: Ultrasound;Traction Ultrasound Ultrasound Location: L shoulder while ER to espose tendon Ultrasound Parameters: 1.2 w/cm2 x 8' Ultrasound Goals: Pain Traction Type of Traction: Lumbar Min (lbs): 45 Max (lbs): 65 Hold Time: 45 Rest Time: 15 Time: 15  Physical Therapy Assessment and Plan PT Assessment and Plan Clinical Impression Statement: Pt requires multimodal cueing for correct form with postural tband exercises. Continued [place] to decrease pain secondary to decreased pain with previous tx. Mechanical traction also completed to decrease radicular sx. Pt reports decreased shoulder pain and radicular sx. PT Plan: Reassess next session.     Problem List Patient Active Problem List  Diagnosis  . OVERWEIGHT  . ESSENTIAL HYPERTENSION  . DERMATITIS  . OSTEOARTHROSIS, LOCAL, PRIMARY, SHOULDER  . DEGENERATIVE JOINT DISEASE, CERVICAL SPINE  . RUPTURE ROTATOR CUFF  . DIARRHEA  . EAR PAIN, LEFT  . ALLERGIC RHINITIS, SEASONAL  .  TREMOR  . GERD (gastroesophageal reflux disease)  . Odynophagia  . Esophageal dysphagia  . Chronic diarrhea  . Unstable balance  . H/O hyperlipidemia  . Anxiety  . Primary generalized (osteo)arthritis  . Back pain with radiation  . Weakness of left leg  . Difficulty in walking    PT - End of Session Activity Tolerance: Patient tolerated treatment well General Behavior During Session: [facility] for tasks performed Cognition: [facility] for tasks performed  GP Functional Assessment Tool Used: Bertis Ruddy, PTA 07/01/2012, 10:19 AM

## 2012-07-06 ENCOUNTER — Ambulatory Visit (HOSPITAL_COMMUNITY)
Admission: RE | Admit: 2012-07-06 | Discharge: 2012-07-06 | Disposition: A | Discharge status: Home / Self Care | Payer: Medicare Other | Source: Ambulatory Visit | Attending: Family Medicine | Admitting: Family Medicine

## 2012-07-06 DIAGNOSIS — R29898 Other symptoms and signs involving the musculoskeletal system: Secondary | ICD-10-CM

## 2012-07-06 NOTE — Progress Notes (Signed)
Physical Therapy Treatment Patient Details  Name: [name] MRN: [id number] Date of Birth: 07-14-1928 Charge:  There ex x 24; [place] x 8; self care x 7 Today's Date: 07/06/2012 Time: 7829-5621 PT Time Calculation (min): 44 min  Visit#: 10 of 12  Re-eval: 07/06/12    Authorization: Medicare  Authorization Time Period:    Authorization Visit#: 10 of 10   Subjective: Symptoms/Limitations Symptoms: Pt states her shoulder will go a couple of days without any pain now; shoulder is not hurting now.  Back is not hurting but has leg pain from knee to ankle. Pain Assessment Currently in Pain?: Yes Pain Score:   8 Pain Location: Leg Pain Orientation: Left    Exercise/Treatments Single Knee to Chest Stretch: 3 reps;30 seconds Double Knee to Chest Stretch: 3 reps;30 seconds Pelvic Tilt: 10 seconds   Seated Other Seated Lumbar Exercises: B ER x 10- Supine Bridge: 10 reps Prone  Straight Leg Raise: 10 reps Opposite Arm/Leg Raise: 10 reps     Modalities Modalities: Ultrasound Ultrasound Ultrasound Location: L Lumbar  Ultrasound Parameters: 1.5 w/cm2  1 MHz x 8 min  Physical Therapy Assessment and Plan PT Assessment and Plan Clinical Impression Statement: Pt had increased leg pain with extension;  completed williams flex I;II and III w/ decreased leg pain.  Gave these ex to pt as HEP instructed to do only those to see how new exercises affect pain.  Trial of [place] vx tx to see if [place] helps more than traction.  G code updated.  Instructed pt in [facility] ER B due to weakness in both shoulders although R is asymptomatic; also instructed in self stretching for ER on L due to tightness PT Plan: two more visits.  assess how flex ex helped pt.    Goals Home Exercise Program PT Goal: Perform Home Exercise Program - Progress: Met PT Short Term Goals PT Short Term Goal 1: Pt to be able to walk in her house without the quad cane PT Short Term Goal 1 - Progress: Met PT Short Term Goal 2: Pt  strength to increase 1/2 grade for the above to occur PT Short Term Goal 2 - Progress: Met PT Short Term Goal 3: Pt Pain in LE to be no greater than a 5/10; back no greater than a 2/10 PT Short Term Goal 3 - Progress: Progressing toward goal PT Short Term Goal 4: Pt to be walking for 15 minutes. PT Short Term Goal 4 - Progress: Met PT Long Term Goals PT Long Term Goal 1: No pain in the leg; Pain in the back to be no greater than a 1 80% of the time. PT Long Term Goal 1 - Progress: Not met PT Long Term Goal 2: Pt no longer to be using a quad cane to walk with. PT Long Term Goal 2 - Progress: Met Long Term Goal 3: Pt strength to be increased one grade to allow the above to occur Long Term Goal 3 Progress: Met Long Term Goal 4: I in advance HEP Long Term Goal 4 Progress: Met PT Long Term Goal 5: oswestry to be improved by 10 points Long Term Goal 5 Progress: Progressing toward goal  Problem List Patient Active Problem List  Diagnosis  . OVERWEIGHT  . ESSENTIAL HYPERTENSION  . DERMATITIS  . OSTEOARTHROSIS, LOCAL, PRIMARY, SHOULDER  . DEGENERATIVE JOINT DISEASE, CERVICAL SPINE  . RUPTURE ROTATOR CUFF  . DIARRHEA  . EAR PAIN, LEFT  . ALLERGIC RHINITIS, SEASONAL  . TREMOR  .  GERD (gastroesophageal reflux disease)  . Odynophagia  . Esophageal dysphagia  . Chronic diarrhea  . Unstable balance  . H/O hyperlipidemia  . Anxiety  . Primary generalized (osteo)arthritis  . Back pain with radiation  . Weakness of left leg  . Difficulty in walking    PT Plan of Care PT Home Exercise Plan: new williams flex given  GP Functional Assessment Tool Used: oswestry Functional Limitation: Mobility: Walking and moving around Mobility: Walking and Moving Around Current Status ([id number]): At least 20 percent but less than 40 percent impaired, limited or restricted Mobility: Walking and Moving Around Goal Status [phone]): At least 1 percent but less than 20 percent impaired, limited or  restricted  Marnae Madani,[name] 07/06/2012, 9:52 AM

## 2012-07-08 ENCOUNTER — Ambulatory Visit (HOSPITAL_COMMUNITY)
Admission: RE | Admit: 2012-07-08 | Discharge: 2012-07-08 | Disposition: A | Discharge status: Home / Self Care | Payer: Medicare Other | Source: Ambulatory Visit | Attending: Family Medicine | Admitting: Family Medicine

## 2012-07-08 NOTE — Progress Notes (Signed)
Physical Therapy Treatment Patient Details  Name: [name] MRN: [id number] Date of Birth: 12-Nov-1928  Today's Date: 07/08/2012 Time: 4098-1191 PT Time Calculation (min): 39 min  Visit#: 11 of 12  Re-eval: 07/06/12 Charges: Therex x 30' USx 8'  Authorization: Medicare   Authorization Visit#: 11 of 20   Subjective: Symptoms/Limitations Symptoms: Pt ststaes that her back feels much better and that flexion exercises decreased her pain. She states that her shoulder and ankle pain are her main concern. Pain Assessment Currently in Pain?: Yes Pain Score:   2 Pain Location: Leg Pain Orientation: Left   Exercise/Treatments Stretches Single Knee to Chest Stretch: 3 reps;30 seconds Double Knee to Chest Stretch: 3 reps;30 seconds Standing Other Standing Lumbar Exercises: B shld flex,ab against wall keeping lumbar still x 10 Seated Other Seated Lumbar Exercises: L shouler ER Supine Bridge: 15 reps Other Supine Lumbar Exercises: Passive LE shoulder IR/ER  Physical Therapy Assessment and Plan PT Assessment and Plan Clinical Impression Statement: Tx focus on decreasing LLE radicular sx and L shoulder pain. Williams flexion exercises appear to be beneficial. Pt reports pain decrease to 2/10 in L shoulder and decreased LLE radicular sx at end of session. PT Plan: Reassess next session.     Problem List Patient Active Problem List  Diagnosis  . OVERWEIGHT  . ESSENTIAL HYPERTENSION  . DERMATITIS  . OSTEOARTHROSIS, LOCAL, PRIMARY, SHOULDER  . DEGENERATIVE JOINT DISEASE, CERVICAL SPINE  . RUPTURE ROTATOR CUFF  . DIARRHEA  . EAR PAIN, LEFT  . ALLERGIC RHINITIS, SEASONAL  . TREMOR  . GERD (gastroesophageal reflux disease)  . Odynophagia  . Esophageal dysphagia  . Chronic diarrhea  . Unstable balance  . H/O hyperlipidemia  . Anxiety  . Primary generalized (osteo)arthritis  . Back pain with radiation  . Weakness of left leg  . Difficulty in walking     General Behavior During Session: [facility] for tasks performed Cognition: [facility] for tasks performed  GP Functional Assessment Tool Used: Bertis Ruddy, PTA  07/08/2012, 9:31 AM

## 2012-07-13 ENCOUNTER — Inpatient Hospital Stay (HOSPITAL_COMMUNITY): Admission: RE | Admit: 2012-07-13 | Payer: Medicare Other | Source: Ambulatory Visit | Admitting: *Deleted

## 2012-07-15 ENCOUNTER — Ambulatory Visit (HOSPITAL_COMMUNITY)
Admission: RE | Admit: 2012-07-15 | Discharge: 2012-07-15 | Disposition: A | Discharge status: Home / Self Care | Payer: Medicare Other | Source: Ambulatory Visit | Attending: Family Medicine | Admitting: Family Medicine

## 2012-07-15 ENCOUNTER — Ambulatory Visit (HOSPITAL_COMMUNITY): Payer: Medicare Other | Admitting: *Deleted

## 2012-07-15 DIAGNOSIS — Z1231 Encounter for screening mammogram for malignant neoplasm of breast: Secondary | ICD-10-CM | POA: Insufficient documentation

## 2012-07-15 DIAGNOSIS — B351 Tinea unguium: Secondary | ICD-10-CM | POA: Diagnosis not present

## 2012-07-15 DIAGNOSIS — Z139 Encounter for screening, unspecified: Secondary | ICD-10-CM

## 2012-07-15 DIAGNOSIS — L600 Ingrowing nail: Secondary | ICD-10-CM | POA: Diagnosis not present

## 2012-07-15 DIAGNOSIS — M79609 Pain in unspecified limb: Secondary | ICD-10-CM | POA: Diagnosis not present

## 2012-07-20 ENCOUNTER — Other Ambulatory Visit: Payer: Self-pay | Admitting: Family Medicine

## 2012-07-20 DIAGNOSIS — R928 Other abnormal and inconclusive findings on diagnostic imaging of breast: Secondary | ICD-10-CM

## 2012-07-28 ENCOUNTER — Ambulatory Visit (HOSPITAL_COMMUNITY)
Admission: RE | Admit: 2012-07-28 | Discharge: 2012-07-28 | Disposition: A | Discharge status: Home / Self Care | Payer: Medicare Other | Source: Ambulatory Visit | Attending: Family Medicine | Admitting: Family Medicine

## 2012-07-28 ENCOUNTER — Telehealth: Payer: Self-pay | Admitting: Family Medicine

## 2012-07-28 DIAGNOSIS — R928 Other abnormal and inconclusive findings on diagnostic imaging of breast: Secondary | ICD-10-CM | POA: Diagnosis not present

## 2012-07-28 MED ORDER — CETIRIZINE HCL 10 MG PO TABS: 10.0000 mg | ORAL_TABLET | Freq: Every day | ORAL | Status: DC

## 2012-07-28 NOTE — Telephone Encounter (Signed)
Med sent in.

## 2012-08-18 ENCOUNTER — Encounter: Payer: Self-pay | Admitting: Family Medicine

## 2012-08-18 ENCOUNTER — Ambulatory Visit (INDEPENDENT_AMBULATORY_CARE_PROVIDER_SITE_OTHER): Payer: Medicare Other | Admitting: Family Medicine

## 2012-08-18 VITALS — BP 130/70 | HR 91 | Resp 16 | Ht 63.0 in | Wt 171.0 lb

## 2012-08-18 DIAGNOSIS — I10 Essential (primary) hypertension: Secondary | ICD-10-CM

## 2012-08-18 DIAGNOSIS — R5381 Other malaise: Secondary | ICD-10-CM

## 2012-08-18 DIAGNOSIS — Z13220 Encounter for screening for lipoid disorders: Secondary | ICD-10-CM

## 2012-08-18 DIAGNOSIS — Z0000 Encounter for general adult medical examination without abnormal findings: Secondary | ICD-10-CM | POA: Insufficient documentation

## 2012-08-18 DIAGNOSIS — Z79899 Other long term (current) drug therapy: Secondary | ICD-10-CM

## 2012-08-18 MED ORDER — ALBUTEROL SULFATE HFA 108 (90 BASE) MCG/ACT IN AERS: 2.0000 | INHALATION_SPRAY | RESPIRATORY_TRACT | Status: DC | PRN

## 2012-08-18 MED ORDER — NEBIVOLOL HCL 10 MG PO TABS: ORAL_TABLET | ORAL | Status: DC

## 2012-08-18 MED ORDER — AMLODIPINE-OLMESARTAN 10-40 MG PO TABS: ORAL_TABLET | ORAL | Status: DC

## 2012-08-18 NOTE — Progress Notes (Signed)
Subjective:    Patient ID: [name], female    DOB: [birth year], 79 y.o.   MRN: [id number]  HPI  Preventive Screening-Counseling & Management   Patient present here today for a Medicare annual wellness visit.   Current Problems (verified)   Medications Prior to Visit Allergies (verified)   PAST HISTORY  Family History: 1 brother and 4 sisters living , 1 deceased in her late 20's had severe dementia  Social History widow x 3 years, had been married for 53 years, mom of 2 sons both out of state Quit nicotine June 2013, no alcohol, no drugs Retired in 1989 worked  In [employer] examiner, lived in [place]   Risk Factors  Current exercise habits:  Walks on avg 3 times per week for 10 minutes each time Dietary issues discussed:low fat, low carb, low sodium    Cardiac risk factors: none significant  Depression Screen  (Note: if answer to either of the following is "Yes", a more complete depression screening is indicated)   Over the past two weeks, have you felt down, depressed or hopeless? No  Over the past two weeks, have you felt little interest or pleasure in doing things? No  Have you lost interest or pleasure in daily life? No  Do you often feel hopeless? No  Do you cry easily over simple problems? No   Activities of Daily Living  In your present state of health, do you have any difficulty performing the following activities?  Driving?: No Managing money?: No Feeding yourself?:No Getting from bed to chair?:No Climbing a flight of stairs?:sometimes, depending on how her back is doing Preparing food and eating?:No Bathing or showering?:No Getting dressed?:No Getting to the toilet?:No Using the toilet?:No Moving around from place to place?: No  Fall Risk Assessment In the past year have you fallen or had a near fall?:No, she does stumble  Are you currently taking any medications that make you dizziness?:No   Hearing Difficulties: No Do you often ask people to  speak up or repeat themselves?:No Do you experience ringing or noises in your ears?:No Do you have difficulty understanding soft or whispered voices?:No  Cognitive Testing  Alert? Yes Normal Appearance?Yes  Oriented to person? Yes Place? Yes  Time? Yes  Displays appropriate judgment?Yes  Can read the correct time from a watch face? yes Are you having problems remembering things?No  Advanced Directives have been discussed with the patient?Yes , full code except does not want indefinite life support in the event of brain death   List the Names of Other Physician/Practitioners you currently use:    Indicate any recent Medical Services you may have received from other than [facility] providers in the past year (date may be approximate).   Assessment:    Annual Wellness Exam   Plan:    During the course of the visit the patient was educated and counseled about appropriate screening and preventive services including:  A healthy diet is rich in fruit, vegetables and whole grains. Poultry fish, nuts and beans are a healthy choice for protein rather then red meat. A low sodium diet and drinking 64 ounces of water daily is generally recommended. Oils and sweet should be limited. Carbohydrates especially for those who are diabetic or overweight, should be limited to 30-45 gram per meal. It is important to eat on a regular schedule, at least 3 times daily. Snacks should be primarily fruits, vegetables or nuts. It is important that you exercise regularly at least 30 minutes 5 times  a week. If you develop chest pain, have severe difficulty breathing, or feel very tired, stop exercising immediately and seek medical attention  Immunization reviewed and updated. Cancer screening reviewed and updated    Patient Instructions (the written plan) was given to the patient.  Medicare Attestation  I have personally reviewed:  The patient's medical and social history  Their use of alcohol, tobacco or illicit  drugs  Their current medications and supplements  The patient's functional ability including ADLs,fall risks, home safety risks, cognitive, and hearing and visual impairment  Diet and physical activities  Evidence for depression or mood disorders  The patient's weight, height, BMI, and visual acuity have been recorded in the chart. I have made referrals, counseling, and provided education to the patient based on review of the above and I have provided the patient with a written personalized care plan for preventive services.     Review of Systems     Objective:   Physical Exam        Assessment & Plan:

## 2012-08-18 NOTE — Patient Instructions (Addendum)
F/u in early October, call if you need me before  Please discuss life alert with your children, you should have one , since you live alone  Please discuss end of life issues  With your  Sons.  Blood pressure is excellent , no med changes  Increase walking to 30 minutes 6 days per week   Add All Bran or shredded wheat to your cereal , and extra fiber to meals this will help loose stool  We will check to see what reflux med is covered, that you are not allergic to and get in touch with you  Fasting cBC, lipid, chem 7 and TSh in October before next visit

## 2012-08-21 NOTE — Assessment & Plan Note (Signed)
Annual wellness visit as documented. Pt is fully functional, with no memory impairment a or depression. She does have excellent family support including her sons who both live in another state Needs to further discuss end of life issues with them Mobility is at times challenging and she uses a cane   At times. He is encouraged to use a life line in the event of a fall, she lives alone

## 2012-08-31 ENCOUNTER — Other Ambulatory Visit: Payer: Self-pay | Admitting: Family Medicine

## 2012-09-07 ENCOUNTER — Other Ambulatory Visit: Payer: Self-pay

## 2012-09-07 MED ORDER — CLONAZEPAM 0.5 MG PO TABS: 0.5000 mg | ORAL_TABLET | Freq: Two times a day (BID) | ORAL | Status: DC

## 2012-11-06 ENCOUNTER — Other Ambulatory Visit: Payer: Self-pay | Admitting: Family Medicine

## 2012-12-27 DIAGNOSIS — H524 Presbyopia: Secondary | ICD-10-CM | POA: Diagnosis not present

## 2012-12-27 DIAGNOSIS — H5200 Hypermetropia, unspecified eye: Secondary | ICD-10-CM | POA: Diagnosis not present

## 2012-12-27 DIAGNOSIS — H52229 Regular astigmatism, unspecified eye: Secondary | ICD-10-CM | POA: Diagnosis not present

## 2012-12-27 DIAGNOSIS — H35039 Hypertensive retinopathy, unspecified eye: Secondary | ICD-10-CM | POA: Diagnosis not present

## 2013-01-19 ENCOUNTER — Other Ambulatory Visit: Payer: Self-pay | Admitting: Family Medicine

## 2013-01-24 ENCOUNTER — Telehealth: Payer: Self-pay | Admitting: Family Medicine

## 2013-01-24 MED ORDER — CLONAZEPAM 0.5 MG PO TABS: 0.5000 mg | ORAL_TABLET | Freq: Two times a day (BID) | ORAL | Status: DC

## 2013-01-24 NOTE — Telephone Encounter (Signed)
Script printed to be sent after Dr Lenox Ponds

## 2013-02-04 ENCOUNTER — Ambulatory Visit (INDEPENDENT_AMBULATORY_CARE_PROVIDER_SITE_OTHER): Payer: Medicare Other

## 2013-02-04 DIAGNOSIS — Z23 Encounter for immunization: Secondary | ICD-10-CM

## 2013-02-09 ENCOUNTER — Ambulatory Visit: Payer: Medicare Other

## 2013-02-11 ENCOUNTER — Other Ambulatory Visit: Payer: Self-pay | Admitting: Family Medicine

## 2013-02-11 DIAGNOSIS — Z79899 Other long term (current) drug therapy: Secondary | ICD-10-CM | POA: Diagnosis not present

## 2013-02-11 DIAGNOSIS — R7301 Impaired fasting glucose: Secondary | ICD-10-CM | POA: Diagnosis not present

## 2013-02-11 DIAGNOSIS — R5381 Other malaise: Secondary | ICD-10-CM | POA: Diagnosis not present

## 2013-02-11 LAB — CBC WITH DIFFERENTIAL/PLATELET
Eosinophils Absolute: 0.2 10*3/uL (ref 0.0–0.7)
Eosinophils Relative: 3 % (ref 0–5)
HCT: 37.7 % (ref 36.0–46.0)
Hemoglobin: 13.1 g/dL (ref 12.0–15.0)
Lymphs Abs: 1.3 10*3/uL (ref 0.7–4.0)
MCH: 28.7 pg (ref 26.0–34.0)
MCV: 82.7 fL (ref 78.0–100.0)
Monocytes Absolute: 0.6 10*3/uL (ref 0.1–1.0)
Monocytes Relative: 10 % (ref 3–12)
Neutrophils Relative %: 64 % (ref 43–77)
RBC: 4.56 MIL/uL (ref 3.87–5.11)

## 2013-02-11 LAB — BASIC METABOLIC PANEL
BUN: 19 mg/dL (ref 6–23)
CO2: 31 mEq/L (ref 19–32)
Calcium: 9.4 mg/dL (ref 8.4–10.5)
Glucose, Bld: 110 mg/dL — ABNORMAL HIGH (ref 70–99)
Sodium: 141 mEq/L (ref 135–145)

## 2013-02-11 LAB — LIPID PANEL
Total CHOL/HDL Ratio: 3.4 Ratio
VLDL: 30 mg/dL (ref 0–40)

## 2013-02-11 LAB — TSH: TSH: 1.709 u[IU]/mL (ref 0.350–4.500)

## 2013-02-14 LAB — HEMOGLOBIN A1C: Hgb A1c MFr Bld: 5.9 % — ABNORMAL HIGH (ref <5.7)

## 2013-02-17 ENCOUNTER — Ambulatory Visit (INDEPENDENT_AMBULATORY_CARE_PROVIDER_SITE_OTHER): Payer: Medicare Other | Admitting: Family Medicine

## 2013-02-17 ENCOUNTER — Encounter: Payer: Self-pay | Admitting: Family Medicine

## 2013-02-17 VITALS — BP 144/72 | HR 56 | Resp 16 | Ht 63.0 in | Wt 165.0 lb

## 2013-02-17 DIAGNOSIS — F419 Anxiety disorder, unspecified: Secondary | ICD-10-CM

## 2013-02-17 DIAGNOSIS — R7302 Impaired glucose tolerance (oral): Secondary | ICD-10-CM

## 2013-02-17 DIAGNOSIS — Z13820 Encounter for screening for osteoporosis: Secondary | ICD-10-CM

## 2013-02-17 DIAGNOSIS — K219 Gastro-esophageal reflux disease without esophagitis: Secondary | ICD-10-CM

## 2013-02-17 DIAGNOSIS — Z79899 Other long term (current) drug therapy: Secondary | ICD-10-CM

## 2013-02-17 DIAGNOSIS — Z862 Personal history of diseases of the blood and blood-forming organs and certain disorders involving the immune mechanism: Secondary | ICD-10-CM | POA: Diagnosis not present

## 2013-02-17 DIAGNOSIS — J301 Allergic rhinitis due to pollen: Secondary | ICD-10-CM

## 2013-02-17 DIAGNOSIS — R7301 Impaired fasting glucose: Secondary | ICD-10-CM

## 2013-02-17 DIAGNOSIS — I10 Essential (primary) hypertension: Secondary | ICD-10-CM

## 2013-02-17 DIAGNOSIS — R7309 Other abnormal glucose: Secondary | ICD-10-CM

## 2013-02-17 DIAGNOSIS — F411 Generalized anxiety disorder: Secondary | ICD-10-CM

## 2013-02-17 DIAGNOSIS — M549 Dorsalgia, unspecified: Secondary | ICD-10-CM

## 2013-02-17 DIAGNOSIS — Z8639 Personal history of other endocrine, nutritional and metabolic disease: Secondary | ICD-10-CM

## 2013-02-17 MED ORDER — PANTOPRAZOLE SODIUM 20 MG PO TBEC: 20.0000 mg | DELAYED_RELEASE_TABLET | Freq: Every day | ORAL | Status: DC

## 2013-02-17 NOTE — Patient Instructions (Addendum)
F/u in 4 month, call if you need me before  Fasting lipid, chem 7 , HBA!C in 4 month  It is important that you exercise regularly at least 30 minutes 5 times a week. If you develop chest pain, have severe difficulty breathing, or feel very tired, stop exercising immediately and seek medical attention   Cholesterol has increased and your blood sugar is higher than it should be, please reduce sweets, fried and fatty foods so this improves  You are referred for bone density test   New med protonix to try for your reflux, use local pharmacy first , if possible to see if this will work for you.  Continue to stop smoking and cut out caffeine

## 2013-02-19 DIAGNOSIS — R7302 Impaired glucose tolerance (oral): Secondary | ICD-10-CM | POA: Insufficient documentation

## 2013-02-19 NOTE — Assessment & Plan Note (Signed)
Controlled, no change in medication  

## 2013-02-19 NOTE — Assessment & Plan Note (Signed)
Uncontrolled, increased symptoms, but heavy caffeine drinker. Will also try PPI  And reduce caffeine intake

## 2013-02-19 NOTE — Progress Notes (Signed)
  Subjective:    Patient ID: [name], female    DOB: February 07, 1929, 79 y.o.   MRN: [id number]  HPI The PT is here for follow up and re-evaluation of chronic medical conditions, medication management and review of any available recent lab and radiology data.  Preventive health is updated, specifically  Cancer screening and Immunization.    The PT denies any adverse reactions to current medications since the last visit.  There are no new concerns. Feels well, has a "friend" (female) who takes her out to eat and she enjoys his company, but she states she has no interest in anything further developing, widow x 3 years There are no specific complaints       Review of Systems See HPI Denies recent fever or chills. Denies sinus pressure, nasal congestion, ear pain or sore throat. Denies chest congestion, productive cough or wheezing. Denies chest pains, palpitations and leg swelling Denies abdominal pain, nausea, vomiting,diarrhea or constipation.   Denies dysuria, frequency, hesitancy or incontinence. Denies uncontrolled  joint pain, swelling and limitation in mobility. Denies headaches, seizures, numbness, or tingling. Denies depression, anxiety or insomnia. Denies skin break down or rash.        Objective:   Physical Exam  Patient alert and oriented and in no cardiopulmonary distress.  HEENT: No facial asymmetry, EOMI, no sinus tenderness,  oropharynx pink and moist.  Neck supple no adenopathy.  Chest: Clear to auscultation bilaterally.  CVS: S1, S2 no murmurs, no S3.  ABD: Soft non tender. Bowel sounds normal.  Ext: No edema  MS: Adequate though reduced ROM spine,adequate in  shoulders, hips and knees.  Skin: Intact, no ulcerations or rash noted.  Psych: Good eye contact, normal affect. Memory intact not anxious or depressed appearing.  CNS: CN 2-12 intact, power, tone and sensation normal throughout.       Assessment & Plan:

## 2013-02-19 NOTE — Assessment & Plan Note (Signed)
Deteriorated. Hyperlipidemia:Low fat diet discussed and encouraged.  No meds at this time 

## 2013-02-19 NOTE — Assessment & Plan Note (Signed)
Patient educated about the importance of limiting  Carbohydrate intake , the need to commit to daily physical activity for a minimum of 30 minutes , and to commit weight loss. The fact that changes in all these areas will reduce or eliminate all together the development of diabetes is stressed.   rept lab in 4 month

## 2013-02-19 NOTE — Assessment & Plan Note (Signed)
Marked improvement in symptoms 

## 2013-02-19 NOTE — Assessment & Plan Note (Signed)
Controlled, no change in medication DASH diet and commitment to daily physical activity for a minimum of 30 minutes discussed and encouraged, as a part of hypertension management. The importance of attaining a healthy weight is also discussed.  

## 2013-02-19 NOTE — Assessment & Plan Note (Signed)
Unchanged, uses generally one klonopin daily, at times needs 2 , one at bedtime, but generally only one. Will not change prescribing direction, but anticipate 90 day supply to last nearer 4.5 months, this is discussed with pt

## 2013-02-23 ENCOUNTER — Ambulatory Visit (HOSPITAL_COMMUNITY)
Admission: RE | Admit: 2013-02-23 | Discharge: 2013-02-23 | Disposition: A | Discharge status: Home / Self Care | Payer: Medicare Other | Source: Ambulatory Visit | Attending: Family Medicine | Admitting: Family Medicine

## 2013-02-23 DIAGNOSIS — M899 Disorder of bone, unspecified: Secondary | ICD-10-CM | POA: Diagnosis not present

## 2013-02-23 DIAGNOSIS — Z13820 Encounter for screening for osteoporosis: Secondary | ICD-10-CM

## 2013-02-24 ENCOUNTER — Other Ambulatory Visit: Payer: Self-pay | Admitting: Family Medicine

## 2013-03-18 ENCOUNTER — Telehealth: Payer: Self-pay | Admitting: Family Medicine

## 2013-03-18 DIAGNOSIS — K219 Gastro-esophageal reflux disease without esophagitis: Secondary | ICD-10-CM

## 2013-03-18 MED ORDER — PANTOPRAZOLE SODIUM 20 MG PO TBEC: 20.0000 mg | DELAYED_RELEASE_TABLET | Freq: Every day | ORAL | Status: DC

## 2013-03-18 NOTE — Telephone Encounter (Signed)
Faxed to [facility]

## 2013-04-13 ENCOUNTER — Other Ambulatory Visit: Payer: Self-pay | Admitting: Family Medicine

## 2013-04-14 ENCOUNTER — Other Ambulatory Visit: Payer: Self-pay | Admitting: Family Medicine

## 2013-04-18 ENCOUNTER — Other Ambulatory Visit: Payer: Self-pay | Admitting: Family Medicine

## 2013-05-05 ENCOUNTER — Other Ambulatory Visit: Payer: Self-pay | Admitting: Family Medicine

## 2013-05-13 ENCOUNTER — Telehealth: Payer: Self-pay | Admitting: Family Medicine

## 2013-05-13 ENCOUNTER — Other Ambulatory Visit: Payer: Self-pay

## 2013-05-13 MED ORDER — CETIRIZINE HCL 10 MG PO TABS: ORAL_TABLET | ORAL | Status: DC

## 2013-05-13 NOTE — Telephone Encounter (Signed)
Med sent.

## 2013-05-22 ENCOUNTER — Encounter (HOSPITAL_COMMUNITY): Payer: Self-pay | Admitting: Emergency Medicine

## 2013-05-22 ENCOUNTER — Emergency Department (HOSPITAL_COMMUNITY): Payer: Medicare Other

## 2013-05-22 ENCOUNTER — Inpatient Hospital Stay (HOSPITAL_COMMUNITY): Payer: Medicare Other | Admitting: Anesthesiology

## 2013-05-22 ENCOUNTER — Inpatient Hospital Stay (HOSPITAL_COMMUNITY)
Admission: EM | Admit: 2013-05-22 | Discharge: 2013-05-25 | DRG: 494 | Disposition: A | Discharge status: Home Health | Payer: Medicare Other | Attending: Orthopaedic Surgery | Admitting: Orthopaedic Surgery

## 2013-05-22 ENCOUNTER — Encounter (HOSPITAL_COMMUNITY)
Admission: EM | Disposition: A | Discharge status: Home Health | Payer: Self-pay | Source: Home / Self Care | Attending: Orthopaedic Surgery

## 2013-05-22 ENCOUNTER — Encounter (HOSPITAL_COMMUNITY): Payer: Medicare Other | Admitting: Anesthesiology

## 2013-05-22 ENCOUNTER — Inpatient Hospital Stay (HOSPITAL_COMMUNITY): Payer: Medicare Other

## 2013-05-22 DIAGNOSIS — F411 Generalized anxiety disorder: Secondary | ICD-10-CM | POA: Diagnosis not present

## 2013-05-22 DIAGNOSIS — Z7982 Long term (current) use of aspirin: Secondary | ICD-10-CM | POA: Diagnosis not present

## 2013-05-22 DIAGNOSIS — I10 Essential (primary) hypertension: Secondary | ICD-10-CM | POA: Diagnosis not present

## 2013-05-22 DIAGNOSIS — S8263XA Displaced fracture of lateral malleolus of unspecified fibula, initial encounter for closed fracture: Secondary | ICD-10-CM | POA: Diagnosis not present

## 2013-05-22 DIAGNOSIS — M25579 Pain in unspecified ankle and joints of unspecified foot: Secondary | ICD-10-CM | POA: Diagnosis not present

## 2013-05-22 DIAGNOSIS — K219 Gastro-esophageal reflux disease without esophagitis: Secondary | ICD-10-CM | POA: Diagnosis not present

## 2013-05-22 DIAGNOSIS — S82843A Displaced bimalleolar fracture of unspecified lower leg, initial encounter for closed fracture: Secondary | ICD-10-CM | POA: Diagnosis not present

## 2013-05-22 DIAGNOSIS — Z833 Family history of diabetes mellitus: Secondary | ICD-10-CM | POA: Diagnosis not present

## 2013-05-22 DIAGNOSIS — W010XXA Fall on same level from slipping, tripping and stumbling without subsequent striking against object, initial encounter: Secondary | ICD-10-CM | POA: Diagnosis present

## 2013-05-22 DIAGNOSIS — S82891A Other fracture of right lower leg, initial encounter for closed fracture: Secondary | ICD-10-CM

## 2013-05-22 DIAGNOSIS — Z23 Encounter for immunization: Secondary | ICD-10-CM | POA: Diagnosis not present

## 2013-05-22 DIAGNOSIS — Z8249 Family history of ischemic heart disease and other diseases of the circulatory system: Secondary | ICD-10-CM

## 2013-05-22 DIAGNOSIS — Z87891 Personal history of nicotine dependence: Secondary | ICD-10-CM

## 2013-05-22 DIAGNOSIS — S82899A Other fracture of unspecified lower leg, initial encounter for closed fracture: Secondary | ICD-10-CM | POA: Diagnosis not present

## 2013-05-22 DIAGNOSIS — F3289 Other specified depressive episodes: Secondary | ICD-10-CM | POA: Diagnosis present

## 2013-05-22 DIAGNOSIS — Y92009 Unspecified place in unspecified non-institutional (private) residence as the place of occurrence of the external cause: Secondary | ICD-10-CM

## 2013-05-22 DIAGNOSIS — S82841A Displaced bimalleolar fracture of right lower leg, initial encounter for closed fracture: Secondary | ICD-10-CM

## 2013-05-22 DIAGNOSIS — F329 Major depressive disorder, single episode, unspecified: Secondary | ICD-10-CM | POA: Diagnosis present

## 2013-05-22 DIAGNOSIS — Z01818 Encounter for other preprocedural examination: Secondary | ICD-10-CM | POA: Diagnosis not present

## 2013-05-22 DIAGNOSIS — R109 Unspecified abdominal pain: Secondary | ICD-10-CM | POA: Diagnosis not present

## 2013-05-22 DIAGNOSIS — R197 Diarrhea, unspecified: Secondary | ICD-10-CM | POA: Diagnosis not present

## 2013-05-22 HISTORY — PX: ORIF ANKLE FRACTURE: SHX5408

## 2013-05-22 LAB — BASIC METABOLIC PANEL
BUN: 20 mg/dL (ref 6–23)
CALCIUM: 9.1 mg/dL (ref 8.4–10.5)
CHLORIDE: 102 meq/L (ref 96–112)
CO2: 27 meq/L (ref 19–32)
CREATININE: 1.08 mg/dL (ref 0.50–1.10)
GFR calc Af Amer: 54 mL/min — ABNORMAL LOW (ref >90)
GFR calc non Af Amer: 46 mL/min — ABNORMAL LOW (ref >90)
GLUCOSE: 103 mg/dL — AB (ref 70–99)
Potassium: 4 mEq/L (ref 3.7–5.3)
Sodium: 141 mEq/L (ref 137–147)

## 2013-05-22 LAB — CBC WITH DIFFERENTIAL/PLATELET
BASOS ABS: 0 10*3/uL (ref 0.0–0.1)
Basophils Relative: 0 % (ref 0–1)
EOS PCT: 1 % (ref 0–5)
Eosinophils Absolute: 0.1 10*3/uL (ref 0.0–0.7)
HEMATOCRIT: 36.9 % (ref 36.0–46.0)
HEMOGLOBIN: 12.6 g/dL (ref 12.0–15.0)
LYMPHS ABS: 0.9 10*3/uL (ref 0.7–4.0)
LYMPHS PCT: 11 % — AB (ref 12–46)
MCH: 28.4 pg (ref 26.0–34.0)
MCHC: 34.1 g/dL (ref 30.0–36.0)
MCV: 83.3 fL (ref 78.0–100.0)
MONO ABS: 0.3 10*3/uL (ref 0.1–1.0)
MONOS PCT: 4 % (ref 3–12)
NEUTROS ABS: 6.9 10*3/uL (ref 1.7–7.7)
Neutrophils Relative %: 84 % — ABNORMAL HIGH (ref 43–77)
Platelets: 167 10*3/uL (ref 150–400)
RBC: 4.43 MIL/uL (ref 3.87–5.11)
RDW: 14.2 % (ref 11.5–15.5)
WBC: 8.2 10*3/uL (ref 4.0–10.5)

## 2013-05-22 LAB — TYPE AND SCREEN
ABO/RH(D): O POS
ANTIBODY SCREEN: NEGATIVE

## 2013-05-22 SURGERY — OPEN REDUCTION INTERNAL FIXATION (ORIF) ANKLE FRACTURE
Anesthesia: General | Site: Ankle | Laterality: Right

## 2013-05-22 MED ORDER — DIPHENHYDRAMINE HCL 12.5 MG/5ML PO ELIX
12.5000 mg | ORAL_SOLUTION | Freq: Four times a day (QID) | ORAL | Status: DC | PRN
  Filled 2013-05-22: qty 5

## 2013-05-22 MED ORDER — LIDOCAINE HCL (PF) 1 % IJ SOLN
INTRAMUSCULAR | Status: AC
  Filled 2013-05-22: qty 5

## 2013-05-22 MED ORDER — VITAMIN E 45 MG (100 UNIT) PO CAPS
1000.0000 [IU] | ORAL_CAPSULE | Freq: Every day | ORAL | Status: DC
  Administered 2013-05-23 – 2013-05-25 (×3): 1000 [IU] via ORAL
  Filled 2013-05-22 (×5): qty 2

## 2013-05-22 MED ORDER — MIDAZOLAM HCL 5 MG/5ML IJ SOLN
INTRAMUSCULAR | Status: DC | PRN
Start: 2013-05-22 — End: 2013-05-22
  Administered 2013-05-22 (×2): 1 mg via INTRAVENOUS

## 2013-05-22 MED ORDER — ENOXAPARIN SODIUM 40 MG/0.4ML ~~LOC~~ SOLN: 40.0000 mg | SUBCUTANEOUS | Status: DC

## 2013-05-22 MED ORDER — ONDANSETRON HCL 4 MG/2ML IJ SOLN
4.0000 mg | Freq: Four times a day (QID) | INTRAMUSCULAR | Status: DC | PRN
Start: 2013-05-22 — End: 2013-05-23
  Administered 2013-05-22 – 2013-05-23 (×2): 4 mg via INTRAVENOUS
  Filled 2013-05-22 (×2): qty 2

## 2013-05-22 MED ORDER — MAGNESIUM HYDROXIDE 400 MG/5ML PO SUSP
30.0000 mL | Freq: Every day | ORAL | Status: DC | PRN
  Filled 2013-05-22: qty 30

## 2013-05-22 MED ORDER — EPHEDRINE SULFATE 50 MG/ML IJ SOLN
INTRAMUSCULAR | Status: DC | PRN
  Administered 2013-05-22 (×2): 10 mg via INTRAVENOUS

## 2013-05-22 MED ORDER — MORPHINE SULFATE 4 MG/ML IJ SOLN
4.0000 mg | Freq: Once | INTRAMUSCULAR | Status: AC
  Administered 2013-05-22: 4 mg via INTRAVENOUS
  Filled 2013-05-22: qty 1

## 2013-05-22 MED ORDER — NEBIVOLOL HCL 2.5 MG PO TABS
2.5000 mg | ORAL_TABLET | Freq: Every day | ORAL | Status: DC
  Administered 2013-05-23 – 2013-05-25 (×3): 2.5 mg via ORAL
  Filled 2013-05-22 (×5): qty 1

## 2013-05-22 MED ORDER — MIDAZOLAM HCL 2 MG/2ML IJ SOLN
INTRAMUSCULAR | Status: AC
  Filled 2013-05-22: qty 2

## 2013-05-22 MED ORDER — CYCLOSPORINE 0.05 % OP EMUL
1.0000 [drp] | Freq: Two times a day (BID) | OPHTHALMIC | Status: DC
  Administered 2013-05-23 – 2013-05-25 (×5): 1 [drp] via OPHTHALMIC
  Filled 2013-05-22 (×10): qty 1

## 2013-05-22 MED ORDER — ACETAMINOPHEN 500 MG PO TABS
1000.0000 mg | ORAL_TABLET | Freq: Four times a day (QID) | ORAL | Status: DC | PRN
  Administered 2013-05-22 – 2013-05-24 (×3): 1000 mg via ORAL
  Filled 2013-05-22 (×3): qty 2

## 2013-05-22 MED ORDER — NALOXONE HCL 0.4 MG/ML IJ SOLN: 0.4000 mg | INTRAMUSCULAR | Status: DC | PRN

## 2013-05-22 MED ORDER — ALBUTEROL SULFATE (2.5 MG/3ML) 0.083% IN NEBU: 2.5000 mg | INHALATION_SOLUTION | Freq: Four times a day (QID) | RESPIRATORY_TRACT | Status: DC

## 2013-05-22 MED ORDER — ONDANSETRON HCL 4 MG/2ML IJ SOLN
4.0000 mg | Freq: Once | INTRAMUSCULAR | Status: DC
  Filled 2013-05-22: qty 2

## 2013-05-22 MED ORDER — LORATADINE 10 MG PO TABS
10.0000 mg | ORAL_TABLET | Freq: Every day | ORAL | Status: DC
  Administered 2013-05-23 – 2013-05-25 (×3): 10 mg via ORAL
  Filled 2013-05-22 (×3): qty 1

## 2013-05-22 MED ORDER — SUCCINYLCHOLINE CHLORIDE 20 MG/ML IJ SOLN
INTRAMUSCULAR | Status: AC
  Filled 2013-05-22: qty 1

## 2013-05-22 MED ORDER — CLONAZEPAM 0.5 MG PO TABS
0.5000 mg | ORAL_TABLET | Freq: Two times a day (BID) | ORAL | Status: DC
  Administered 2013-05-23 – 2013-05-25 (×5): 0.5 mg via ORAL
  Filled 2013-05-22 (×6): qty 1

## 2013-05-22 MED ORDER — ATROPINE SULFATE 0.4 MG/ML IJ SOLN
INTRAMUSCULAR | Status: DC | PRN
  Administered 2013-05-22: 0.4 mg via INTRAVENOUS

## 2013-05-22 MED ORDER — ASPIRIN EC 81 MG PO TBEC
81.0000 mg | DELAYED_RELEASE_TABLET | Freq: Every day | ORAL | Status: DC
  Administered 2013-05-23 – 2013-05-25 (×3): 81 mg via ORAL
  Filled 2013-05-22 (×3): qty 1

## 2013-05-22 MED ORDER — LACTATED RINGERS IV SOLN
INTRAVENOUS | Status: DC | PRN
  Administered 2013-05-22: 14:00:00 via INTRAVENOUS

## 2013-05-22 MED ORDER — GLYCOPYRROLATE 0.2 MG/ML IJ SOLN
INTRAMUSCULAR | Status: AC
  Filled 2013-05-22: qty 1

## 2013-05-22 MED ORDER — ACETAMINOPHEN 325 MG PO TABS: 650.0000 mg | ORAL_TABLET | Freq: Four times a day (QID) | ORAL | Status: DC | PRN

## 2013-05-22 MED ORDER — FENTANYL CITRATE 0.05 MG/ML IJ SOLN
INTRAMUSCULAR | Status: DC | PRN
  Administered 2013-05-22 (×4): 50 ug via INTRAVENOUS

## 2013-05-22 MED ORDER — FENTANYL CITRATE 0.05 MG/ML IJ SOLN
INTRAMUSCULAR | Status: AC
  Filled 2013-05-22: qty 2

## 2013-05-22 MED ORDER — PROPOFOL 10 MG/ML IV EMUL
INTRAVENOUS | Status: AC
  Filled 2013-05-22: qty 20

## 2013-05-22 MED ORDER — FENTANYL CITRATE 0.05 MG/ML IJ SOLN
INTRAMUSCULAR | Status: AC
Start: 2013-05-22 — End: 2013-05-22
  Filled 2013-05-22: qty 2

## 2013-05-22 MED ORDER — ONDANSETRON HCL 4 MG/2ML IJ SOLN
INTRAMUSCULAR | Status: DC | PRN
  Administered 2013-05-22: 4 mg via INTRAVENOUS

## 2013-05-22 MED ORDER — VANCOMYCIN HCL IN DEXTROSE 1-5 GM/200ML-% IV SOLN
1000.0000 mg | Freq: Once | INTRAVENOUS | Status: AC
  Administered 2013-05-22: 1000 mg via INTRAVENOUS
  Filled 2013-05-22: qty 200

## 2013-05-22 MED ORDER — SODIUM CHLORIDE 0.9 % IR SOLN
Status: DC | PRN
  Administered 2013-05-22: 1000 mL

## 2013-05-22 MED ORDER — ONDANSETRON HCL 4 MG/2ML IJ SOLN
4.0000 mg | Freq: Once | INTRAMUSCULAR | Status: AC
  Administered 2013-05-22: 4 mg via INTRAVENOUS

## 2013-05-22 MED ORDER — ATROPINE SULFATE 1 MG/ML IJ SOLN
INTRAMUSCULAR | Status: AC
  Filled 2013-05-22: qty 1

## 2013-05-22 MED ORDER — PHENYLEPHRINE HCL 10 MG/ML IJ SOLN
INTRAMUSCULAR | Status: AC
  Filled 2013-05-22: qty 1

## 2013-05-22 MED ORDER — AMLODIPINE-OLMESARTAN 10-40 MG PO TABS: 1.0000 | ORAL_TABLET | Freq: Every day | ORAL | Status: DC

## 2013-05-22 MED ORDER — ETOMIDATE 2 MG/ML IV SOLN
INTRAVENOUS | Status: AC
  Filled 2013-05-22: qty 10

## 2013-05-22 MED ORDER — ARTIFICIAL TEARS OP OINT
TOPICAL_OINTMENT | OPHTHALMIC | Status: AC
  Filled 2013-05-22: qty 3.5

## 2013-05-22 MED ORDER — ETOMIDATE 2 MG/ML IV SOLN
INTRAVENOUS | Status: DC | PRN
  Administered 2013-05-22: 10 mg via INTRAVENOUS

## 2013-05-22 MED ORDER — AMLODIPINE BESYLATE 5 MG PO TABS
10.0000 mg | ORAL_TABLET | Freq: Every day | ORAL | Status: DC
  Administered 2013-05-23 – 2013-05-25 (×3): 10 mg via ORAL
  Filled 2013-05-22 (×3): qty 2

## 2013-05-22 MED ORDER — PNEUMOCOCCAL VAC POLYVALENT 25 MCG/0.5ML IJ INJ
0.5000 mL | INJECTION | INTRAMUSCULAR | Status: AC
  Administered 2013-05-23: 0.5 mL via INTRAMUSCULAR
  Filled 2013-05-22: qty 0.5

## 2013-05-22 MED ORDER — MORPHINE SULFATE (PF) 1 MG/ML IV SOLN
INTRAVENOUS | Status: DC
  Administered 2013-05-22: 17:00:00 via INTRAVENOUS
  Administered 2013-05-22: 9 mg via INTRAVENOUS
  Administered 2013-05-23: 7.5 mg via INTRAVENOUS
  Filled 2013-05-22: qty 25

## 2013-05-22 MED ORDER — MAGNESIUM HYDROXIDE 400 MG/5ML PO SUSP: 30.0000 mL | Freq: Every day | ORAL | Status: DC | PRN

## 2013-05-22 MED ORDER — LACTATED RINGERS IV SOLN
INTRAVENOUS | Status: DC
  Administered 2013-05-22 – 2013-05-24 (×6): via INTRAVENOUS

## 2013-05-22 MED ORDER — ENOXAPARIN SODIUM 40 MG/0.4ML ~~LOC~~ SOLN
40.0000 mg | SUBCUTANEOUS | Status: DC
  Administered 2013-05-23 – 2013-05-25 (×3): 40 mg via SUBCUTANEOUS
  Filled 2013-05-22 (×3): qty 0.4

## 2013-05-22 MED ORDER — SODIUM CHLORIDE 0.9 % IJ SOLN: 9.0000 mL | INTRAMUSCULAR | Status: DC | PRN

## 2013-05-22 MED ORDER — DIPHENHYDRAMINE HCL 50 MG/ML IJ SOLN: 12.5000 mg | Freq: Four times a day (QID) | INTRAMUSCULAR | Status: DC | PRN

## 2013-05-22 MED ORDER — IRBESARTAN 300 MG PO TABS
300.0000 mg | ORAL_TABLET | Freq: Every day | ORAL | Status: DC
  Administered 2013-05-23 – 2013-05-25 (×3): 300 mg via ORAL
  Filled 2013-05-22 (×3): qty 1

## 2013-05-22 MED ORDER — ALBUTEROL SULFATE (2.5 MG/3ML) 0.083% IN NEBU
2.5000 mg | INHALATION_SOLUTION | Freq: Four times a day (QID) | RESPIRATORY_TRACT | Status: DC
  Administered 2013-05-22 – 2013-05-23 (×2): 2.5 mg via RESPIRATORY_TRACT
  Filled 2013-05-22: qty 3

## 2013-05-22 MED ORDER — HYDROCHLOROTHIAZIDE 12.5 MG PO CAPS
12.5000 mg | ORAL_CAPSULE | Freq: Every day | ORAL | Status: DC
  Administered 2013-05-23 – 2013-05-25 (×3): 12.5 mg via ORAL
  Filled 2013-05-22 (×3): qty 1

## 2013-05-22 MED ORDER — PROMETHAZINE HCL 25 MG/ML IJ SOLN: 12.5000 mg | INTRAMUSCULAR | Status: DC | PRN

## 2013-05-22 MED ORDER — ZOLPIDEM TARTRATE 5 MG PO TABS
5.0000 mg | ORAL_TABLET | Freq: Every day | ORAL | Status: DC
  Administered 2013-05-23 – 2013-05-24 (×2): 5 mg via ORAL
  Filled 2013-05-22 (×3): qty 1

## 2013-05-22 MED ORDER — GLYCOPYRROLATE 0.2 MG/ML IJ SOLN
INTRAMUSCULAR | Status: DC | PRN
  Administered 2013-05-22: .2 mg via INTRAVENOUS

## 2013-05-22 MED ORDER — ROCURONIUM BROMIDE 50 MG/5ML IV SOLN
INTRAVENOUS | Status: AC
  Filled 2013-05-22: qty 1

## 2013-05-22 MED ORDER — LIDOCAINE HCL (CARDIAC) 20 MG/ML IV SOLN
INTRAVENOUS | Status: DC | PRN
  Administered 2013-05-22: 50 mg via INTRAVENOUS

## 2013-05-22 MED ORDER — SUCCINYLCHOLINE CHLORIDE 20 MG/ML IJ SOLN
INTRAMUSCULAR | Status: DC | PRN
  Administered 2013-05-22: 100 mg via INTRAVENOUS

## 2013-05-22 MED ORDER — BACTERIOSTATIC WATER(BENZ ALC) IJ SOLN
INTRAMUSCULAR | Status: AC
  Filled 2013-05-22: qty 30

## 2013-05-22 SURGICAL SUPPLY — 56 items
BAG HAMPER (MISCELLANEOUS) ×2 IMPLANT
BANDAGE ELASTIC 4 VELCRO NS (GAUZE/BANDAGES/DRESSINGS) ×3 IMPLANT
BANDAGE ESMARK 4X12 BL STRL LF (DISPOSABLE) ×1 IMPLANT
BIT DRILL 2.5X110 QC LCP DISP (BIT) ×2 IMPLANT
BIT DRILL 2.8 (BIT) ×1
BIT DRILL CANN QC 2.8X165 (BIT) IMPLANT
BLADE SURG 15 STRL LF DISP TIS (BLADE) ×1 IMPLANT
BLADE SURG 15 STRL SS (BLADE) ×2
BLADE SURG SZ10 CARB STEEL (BLADE) ×2 IMPLANT
BNDG CMPR 12X4 ELC STRL LF (DISPOSABLE) ×1
BNDG ESMARK 4X12 BLUE STRL LF (DISPOSABLE) ×2
CLOTH BEACON ORANGE TIMEOUT ST (SAFETY) ×2 IMPLANT
COVER LIGHT HANDLE STERIS (MISCELLANEOUS) ×4 IMPLANT
COVER MAYO STAND XLG (DRAPE) ×2 IMPLANT
CUFF TOURNIQUET SINGLE 34IN LL (TOURNIQUET CUFF) ×2 IMPLANT
DRAPE C-ARM FOLDED MOBILE STRL (DRAPES) ×1 IMPLANT
DRILL BIT 2.8MM (BIT) ×2
DURAPREP 26ML APPLICATOR (WOUND CARE) ×2 IMPLANT
GAUZE XEROFORM 5X9 LF (GAUZE/BANDAGES/DRESSINGS) ×2 IMPLANT
GLOVE BIO SURGEON STRL SZ8 (GLOVE) ×2 IMPLANT
GLOVE BIO SURGEON STRL SZ8.5 (GLOVE) ×2 IMPLANT
GLOVE BIOGEL PI IND STRL 7.5 (GLOVE) IMPLANT
GLOVE BIOGEL PI INDICATOR 7.5 (GLOVE) ×1
GLOVE ECLIPSE 7.0 STRL STRAW (GLOVE) ×2 IMPLANT
GLOVE ECLIPSE 7.5 STRL STRAW (GLOVE) ×2 IMPLANT
GOWN STRL REIN XL XLG (GOWN DISPOSABLE) ×5 IMPLANT
INST SET MINOR BONE (KITS) ×2 IMPLANT
KIT ROOM TURNOVER APOR (KITS) ×2 IMPLANT
MANIFOLD NEPTUNE II (INSTRUMENTS) ×2 IMPLANT
NS IRRIG 1000ML POUR BTL (IV SOLUTION) ×2 IMPLANT
PACK BASIC LIMB (CUSTOM PROCEDURE TRAY) ×2 IMPLANT
PAD ABD 5X9 TENDERSORB (GAUZE/BANDAGES/DRESSINGS) ×4 IMPLANT
PAD ARMBOARD 7.5X6 YLW CONV (MISCELLANEOUS) ×2 IMPLANT
PAD CAST 4YDX4 CTTN HI CHSV (CAST SUPPLIES) ×1 IMPLANT
PADDING CAST COTTON 4X4 STRL (CAST SUPPLIES) ×4
PLATE LCP 3.5 1/3 TUB 6HX69 (Plate) ×1 IMPLANT
SCREW CANC FT/18 4.0 (Screw) ×1 IMPLANT
SCREW CORTEX 3.5 14MM (Screw) ×1 IMPLANT
SCREW CORTEX 3.5 16MM (Screw) ×1 IMPLANT
SCREW CORTEX 3.5 18MM (Screw) ×1 IMPLANT
SCREW CORTEX 3.5 20MM (Screw) ×1 IMPLANT
SCREW LOCK CORT ST 3.5X14 (Screw) IMPLANT
SCREW LOCK CORT ST 3.5X16 (Screw) IMPLANT
SCREW LOCK CORT ST 3.5X18 (Screw) IMPLANT
SCREW LOCK CORT ST 3.5X20 (Screw) IMPLANT
SCREW LOCK T15 FT 16X3.5X2.9X (Screw) IMPLANT
SCREW LOCKING 3.5X16 (Screw) ×2 IMPLANT
SET BASIN LINEN APH (SET/KITS/TRAYS/PACK) ×2 IMPLANT
SPLINT J IMMOBILIZER 4X20FT (CAST SUPPLIES) IMPLANT
SPLINT J PLASTER J 4INX20Y (CAST SUPPLIES) ×1
SPONGE GAUZE 4X4 12PLY (GAUZE/BANDAGES/DRESSINGS) ×2 IMPLANT
SPONGE LAP 18X18 X RAY DECT (DISPOSABLE) ×2 IMPLANT
STAPLER VISISTAT 35W (STAPLE) ×1 IMPLANT
SUT CHROMIC 2 0 CT 1 (SUTURE) ×3 IMPLANT
SUT NUROLON CT 2 BLK #1 18IN (SUTURE) IMPLANT
SUT PLAIN 2 0 XLH (SUTURE) ×3 IMPLANT

## 2013-05-22 NOTE — ED Notes (Signed)
Pt reports slipped walking down a hill taking her garbage out this morning.  Pt has swelling and deformity noted to r ankle.  Pt says has been unable to walk since the fall.  Denies any pain anywhere else.  Denies hitting head or any LOC.

## 2013-05-22 NOTE — ED Provider Notes (Signed)
CSN: [id number]     Arrival date & time 05/22/13  [id number] History   First MD Initiated Contact with Patient 05/22/13 1022     Chief Complaint  Patient presents with  . Ankle Pain   (Consider location/radiation/quality/duration/timing/severity/associated sxs/prior Treatment) Patient is a 80 y.o. female presenting with ankle pain. The history is provided by the patient.  Ankle Pain Location:  Ankle Injury: yes   Mechanism of injury: fall   Fall:    Fall occurred: walking down a decline area.   Impact surface:  Grass Ankle location:  R ankle Pain details:    Quality:  Aching   Radiates to:  Does not radiate   Severity:  Moderate   Onset quality:  Sudden   Timing:  Constant   Progression:  Worsening Chronicity:  New Foreign body present:  No foreign bodies Prior injury to area:  No Relieved by:  Nothing Worsened by:  Bearing weight Ineffective treatments:  None tried Associated symptoms: decreased ROM, swelling and tingling   Associated symptoms: no back pain and no neck pain   Risk factors: no frequent fractures     Past Medical History  Diagnosis Date  . Hypertension 20 years   . Chronic diarrhea   . Anxiety   . Depression   . Kidney stone     hx/ crushed   . Hx of adenomatous colonic polyps     tubular adenomas, last found in 2008  . GERD (gastroesophageal reflux disease)   . Erosive esophagitis   . Schatzki's ring 08/26/10    Last dilated on EGD by Dr. Trevor Iha [facility], linear gastric erosions, BI hemigastrectomy  . Hyperplastic colon polyp 03/19/10    tcs by Dr. Gala Romney   Past Surgical History  Procedure Laterality Date  . Stomach ulcer  50 years ago     had some of her stomach removed   . Cholecystectomy  30 years ago  . Bunion removal      from both feet   . Left shoulder surgery  2009    DR HARRISON  . Colonoscopy  03/19/2010    DR Gala Romney,, normal TI, pancolonic diverticula, random colon bx neg., hyperplastic polyps removed  . Esophagogastroduodenoscopy   11/22/2003    DR Gala Romney, erosive RE, [place] I  . Esophagogastroduodenoscopy  08/26/10    Dr. Gala Romney- gastric xanthelasma/Schatzki's ring status post dilation  . Breast lumpectomy Left   . Cholecystectomy     Family History  Problem Relation Age of Onset  . Hypertension Mother   . Kidney failure Brother     X1 ON DIALYSIS  . Diabetes Sister   . Cancer Sister     X2  . Hypertension Father    History  Substance Use Topics  . Smoking status: Former Smoker    Types: Cigarettes    Quit date: 11/09/2011  . Smokeless tobacco: Never Used     Comment: quit a few weeks ago   . Alcohol Use: No   OB History   Grav Para Term Preterm Abortions TAB SAB Ect Mult Living                 Review of Systems  Constitutional: Negative for activity change.       All ROS Neg except as noted in HPI  HENT: Negative for nosebleeds.   Eyes: Negative for photophobia and discharge.  Respiratory: Negative for cough, shortness of breath and wheezing.   Cardiovascular: Negative for chest pain and palpitations.  Gastrointestinal: Positive for  abdominal pain and diarrhea. Negative for blood in stool.  Genitourinary: Negative for dysuria, frequency and hematuria.  Musculoskeletal: Negative for arthralgias, back pain and neck pain.  Skin: Negative.   Neurological: Negative for dizziness, seizures and speech difficulty.  Psychiatric/Behavioral: Negative for hallucinations and confusion. The patient is nervous/anxious.     Allergies  Aciphex; Amlodipine besylate-valsartan; Esomeprazole magnesium; Omeprazole; Penicillins; and Ciprofloxacin  Home Medications   Current Outpatient Rx  Name  Route  Sig  Dispense  Refill  . aspirin 81 MG EC tablet   Oral   Take 81 mg by mouth daily.           . AZOR 10-40 MG per tablet      TAKE 1 TABLET DAILY   90 tablet   0   . calcium carbonate (TUMS EX) 750 MG chewable tablet   Oral   Chew 1 tablet by mouth 3 (three) times daily.         . cetirizine  (ZYRTEC) 10 MG tablet      TAKE ONE TABLET BY MOUTH EVERY DAY   90 tablet   0   . clonazePAM (KLONOPIN) 0.5 MG tablet   Oral   Take 1 tablet (0.5 mg total) by mouth 2 (two) times daily.   180 tablet   0   . hydrochlorothiazide (MICROZIDE) 12.5 MG capsule      TAKE 1 CAPSULE DAILY   90 capsule   0   . nebivolol (BYSTOLIC) 10 MG tablet      TAKE 1 TABLET DAILY   90 tablet   0   . pantoprazole (PROTONIX) 20 MG tablet      TAKE 1 TABLET EVERY DAY   30 tablet   4   . Probiotic Product (PROBIOTIC PO)   Oral   Take by mouth.          . vitamin E (VITAMIN E) 1000 UNIT capsule   Oral   Take 1,000 Units by mouth daily.            BP 139/66  Pulse 103  Temp(Src) 98 F (36.7 C) (Oral)  Resp 18  Ht 5\' 3"  (1.6 m)  Wt 162 lb (73.483 kg)  BMI 28.70 kg/m2  SpO2 98% Physical Exam  Nursing note and vitals reviewed. Constitutional: She is oriented to person, place, and time. She appears well-developed and well-nourished.  Non-toxic appearance.  HENT:  Head: Normocephalic.  Right Ear: Tympanic membrane and external ear normal.  Left Ear: Tympanic membrane and external ear normal.  Eyes: EOM and lids are normal. Pupils are equal, round, and reactive to light.  Neck: Normal range of motion. Neck supple. Carotid bruit is not present.  Cardiovascular: Normal rate, regular rhythm, normal heart sounds, intact distal pulses and normal pulses.   Pulmonary/Chest: Breath sounds normal. No respiratory distress.  Abdominal: Soft. Bowel sounds are normal. There is no tenderness. There is no guarding.  Musculoskeletal: Normal range of motion.  There is good range of motion of the right hip and knee. There is deformity of the right ankle. There is some tenting laterally. The dorsalis pedis pulses 1+. The capillary refill of the toes is less than 3 seconds. The Achilles tendon is intact.  There is full range of motion of the left hip, knee, ankle, and toes.  Lymphadenopathy:        Head (right side): No submandibular adenopathy present.       Head (left side): No submandibular adenopathy present.  She has no cervical adenopathy.  Neurological: She is alert and oriented to person, place, and time. She has normal strength. No cranial nerve deficit or sensory deficit.  Skin: Skin is warm and dry.  Psychiatric: She has a normal mood and affect. Her speech is normal.    ED Course  Procedures (including critical care time) Labs Review Labs Reviewed  CBC WITH DIFFERENTIAL  BASIC METABOLIC PANEL  URINALYSIS, ROUTINE W REFLEX MICROSCOPIC   Imaging Review Dg Ankle Complete Right  05/22/2013   CLINICAL DATA:  Fall, lateral malleolus pain  EXAM: RIGHT ANKLE - COMPLETE 3+ VIEW  COMPARISON:  None.  FINDINGS: Three views of the right ankle submitted. There is displaced oblique fracture in distal right fibula There is significant disruption of ankle mortise with medial displacement of distal tibia and fibula. There is widening of medial tibiotalar space.  IMPRESSION: Displaced oblique fracture in distal right fibula. Disruption of ankle mortise with widening of medial tibial talar space and medial displacement of distal tibia and fibula.   Electronically Signed   By: Lahoma Crocker M.D.   On: 05/22/2013 10:48    EKG Interpretation   None       MDM  No diagnosis found. *I have reviewed nursing notes, vital signs, and all appropriate lab and imaging results for this patient.*  Patient was walking down a decline this morning when she sustained a fall. She has swelling and deformity noted of the right ankle. She's not been able to apply any weight to the right lower extremity since the fall.  There are displaced oblique fractures in the distal right fibula. There is disruption of the ankle mortise with widening of the medial tibiotalar space. There is also medial displacement of the distal fibula and tibia. Patient made aware of the plans and findings. Patient given medication for  pain and made comfortable.I have discussed the case with Dr. Luna Glasgow. Dr. Luna Glasgow will come in to see the patient for admission.    Lenox Ahr, PA-C 05/22/13 [place], PA-C 05/22/13 2103

## 2013-05-22 NOTE — Transfer of Care (Signed)
Immediate Anesthesia Transfer of Care Note  Patient: [name]  Procedure(s) Performed: Procedure(s): OPEN REDUCTION INTERNAL FIXATION (ORIF) RIGHT ANKLE FRACTURE (Right)  Patient Location: PACU  Anesthesia Type:General  Level of Consciousness: sedated and patient cooperative  Airway & Oxygen Therapy: Patient Spontanous Breathing and Patient connected to face mask oxygen  Post-op Assessment: Report given to PACU RN and Post -op Vital signs reviewed and stable  Post vital signs: Reviewed and stable  Complications: No apparent anesthesia complications

## 2013-05-22 NOTE — Anesthesia Postprocedure Evaluation (Signed)
  Anesthesia Post-op Note  Patient: [name]  Procedure(s) Performed: Procedure(s): OPEN REDUCTION INTERNAL FIXATION (ORIF) RIGHT ANKLE FRACTURE (Right)  Patient Location: PACU  Anesthesia Type:General  Level of Consciousness: sedated and patient cooperative  Airway and Oxygen Therapy: Patient Spontanous Breathing and Patient connected to face mask oxygen  Post-op Pain: mild  Post-op Assessment: Post-op Vital signs reviewed, Patient's Cardiovascular Status Stable, Respiratory Function Stable, RESPIRATORY FUNCTION UNSTABLE, No signs of Nausea or vomiting and Pain level controlled  Post-op Vital Signs: Reviewed and stable  Complications: No apparent anesthesia complications

## 2013-05-22 NOTE — Brief Op Note (Signed)
05/22/2013  3:54 PM  PATIENT:  [name]  80 y.o. female  PRE-OPERATIVE DIAGNOSIS:  Right Ankle Fracture  POST-OPERATIVE DIAGNOSIS:  Right Ankle Fracture  PROCEDURE:  Procedure(s): OPEN REDUCTION INTERNAL FIXATION (ORIF) RIGHT ANKLE FRACTURE (Right) lateral malleolus and repair deltoid ligament medially  SURGEON:  Surgeon(s) and Role:    * Sanjuana Kava, MD - Primary  PHYSICIAN ASSISTANT:   ASSISTANTS: none   ANESTHESIA:   general  EBL:  Total I/O In: 600 [I.V.:600] Out: -   BLOOD ADMINISTERED:none  DRAINS: none   LOCAL MEDICATIONS USED:  NONE  SPECIMEN:  No Specimen  DISPOSITION OF SPECIMEN:  N/A  COUNTS:  YES  TOURNIQUET:   Total Tourniquet Time Documented: Thigh (Right) - 31 minutes Total: Thigh (Right) - 31 minutes   DICTATION: .Other Dictation: Dictation Number [id number]  PLAN OF CARE: Admit to inpatient   PATIENT DISPOSITION:  PACU - hemodynamically stable.   Delay start of Pharmacological VTE agent (>24hrs) due to surgical blood loss or risk of bleeding: no

## 2013-05-22 NOTE — Anesthesia Procedure Notes (Signed)
Procedure Name: Intubation Date/Time: 05/22/2013 2:59 PM Performed by: Andree Elk, AMY A Pre-anesthesia Checklist: Patient identified, Patient being monitored, Timeout performed, Emergency Drugs available and Suction available Patient Re-evaluated:Patient Re-evaluated prior to inductionOxygen Delivery Method: Circle System Utilized Preoxygenation: Pre-oxygenation with 100% oxygen Intubation Type: IV induction, Rapid sequence and Cricoid Pressure applied Laryngoscope Size: 3 and Miller Grade View: Grade I Tube type: Oral Tube size: 7.0 mm Number of attempts: 1 Airway Equipment and Method: stylet Placement Confirmation: ETT inserted through vocal cords under direct vision,  positive ETCO2 and breath sounds checked- equal and bilateral Secured at: 21 cm Tube secured with: Tape Dental Injury: Teeth and Oropharynx as per pre-operative assessment

## 2013-05-22 NOTE — Anesthesia Preprocedure Evaluation (Addendum)
Anesthesia Evaluation  Patient identified by MRN, date of birth, ID band Patient awake    Reviewed: Allergy & Precautions, H&P , NPO status , Patient's Chart, lab work & pertinent test results  Airway Mallampati: II TM Distance: >3 FB Neck ROM: Full    Dental  (+) Partial Upper and Missing   Pulmonary former smoker,  breath sounds clear to auscultation        Cardiovascular hypertension, Pt. on home beta blockers and Pt. on medications Rhythm:Regular Rate:Normal     Neuro/Psych Anxiety Depression Anxiety and Depression    GI/Hepatic PUD, GERD-  Medicated,  Endo/Other    Renal/GU Renal diseaseKidney Disease     Musculoskeletal   Abdominal   Peds  Hematology   Anesthesia Other Findings Upon meeting patient, patient stated I do not want a "spinal tap";  I discussed at length risks and benefits of spinal anesthesia vs. General Anesthesia.  After this discussion, patient still stated that she did not want spinal anesthesia.  Reproductive/Obstetrics                       Anesthesia Physical Anesthesia Plan  ASA: III and emergent  Anesthesia Plan: General   Post-op Pain Management:    Induction: Intravenous, Rapid sequence and Cricoid pressure planned  Airway Management Planned: Oral ETT  Additional Equipment:   Intra-op Plan:   Post-operative Plan: Extubation in OR  Informed Consent: I have reviewed the patients History and Physical, chart, labs and discussed the procedure including the risks, benefits and alternatives for the proposed anesthesia with the patient or authorized representative who has indicated his/her understanding and acceptance.   Dental advisory given  Plan Discussed with: Anesthesiologist and Surgeon  Anesthesia Plan Comments:        Anesthesia Quick Evaluation

## 2013-05-22 NOTE — ED Provider Notes (Signed)
Pt seen with PA Marshell Levan for right ankle fracture This is a closed injury but has significant displacement She denies any other complaints, denies knee pain Orthopedist dr Luna Glasgow to see in one hour Pt currently stable   Sharyon Cable, MD 05/22/13 1151

## 2013-05-22 NOTE — H&P (Signed)
[name] is an 80 y.o. female.   Chief Complaint: I broke my right ankle HPI: She fell this morning taking out the trash.  She slipped and hurt her right ankle.  She has deformity of the ankle and unable to stand.  She has no other injury.  X-rays show fracture of lateral malleolus and disruption of medial side of ankle with displacement laterally.  I have informed her of need for surgery of the right ankle.  I have gone over the risks and imponderables of the procedure including infection, embolus which could cause death, need for physical therapy and non-weight bearing and anesthesia risks.  She asked appropriate questions and appears to understand need for surgery.  Past Medical History  Diagnosis Date  . Hypertension 20 years   . Chronic diarrhea   . Anxiety   . Depression   . Kidney stone     hx/ crushed   . Hx of adenomatous colonic polyps     tubular adenomas, last found in 2008  . GERD (gastroesophageal reflux disease)   . Erosive esophagitis   . Schatzki's ring 08/26/10    Last dilated on EGD by Dr. Trevor I[facility], linear gastric erosions, BI hemigastrectomy  . Hyperplastic colon polyp 03/19/10    tcs by Dr. Gala Romney    Past Surgical History  Procedure Laterality Date  . Stomach ulcer  50 years ago     had some of her stomach removed   . Cholecystectomy  30 years ago  . Bunion removal      from both feet   . Left shoulder surgery  2009    DR HARRISON  . Colonoscopy  03/19/2010    DR Gala Romney,, normal TI, pancolonic diverticula, random colon bx neg., hyperplastic polyps removed  . Esophagogastroduodenoscopy  11/22/2003    DR Gala Romney, erosive RE, [place]  . Esophagogastroduodenoscopy  08/26/10    Dr. Gala Romney- gastric xanthelasma/Schatzki's ring status post dilation  . Breast lumpectomy Left   . Cholecystectomy      Family History  Problem Relation Age of Onset  . Hypertension Mother   . Kidney failure Brother     X1 ON DIALYSIS  . Diabetes Sister   . Cancer Sister      X2  . Hypertension Father    Social History:  reports that she quit smoking about 18 months ago. Her smoking use included Cigarettes. She smoked 0.00 packs per day. She has never used smokeless tobacco. She reports that she does not drink alcohol or use illicit drugs.  Allergies:  Allergies  Allergen Reactions  . Aciphex [Rabeprazole Sodium] Other (See Comments)    unknown  . Amlodipine Besylate-Valsartan     Rash to high-dose (5/320)  . Esomeprazole Magnesium   . Omeprazole Other (See Comments)    Patient states "medication didn't work"  . Penicillins Other (See Comments)    unknown  . Ciprofloxacin Rash    Medications Prior to Admission  Medication Sig Dispense Refill  . acetaminophen (TYLENOL) 500 MG tablet Take 1,000 mg by mouth every 6 (six) hours as needed for moderate pain.      Marland Kitchen aspirin 81 MG EC tablet Take 81 mg by mouth daily.        . AZOR 10-40 MG per tablet TAKE 1 TABLET DAILY  90 tablet  0  . cetirizine (ZYRTEC) 10 MG tablet TAKE ONE TABLET BY MOUTH EVERY DAY  90 tablet  0  . clonazePAM (KLONOPIN) 0.5 MG tablet Take 1 tablet (  0.5 mg total) by mouth 2 (two) times daily.  180 tablet  0  . cycloSPORINE (RESTASIS) 0.05 % ophthalmic emulsion Place 1 drop into both eyes 2 (two) times daily.      . hydrochlorothiazide (MICROZIDE) 12.5 MG capsule TAKE 1 CAPSULE DAILY  90 capsule  0  . nebivolol (BYSTOLIC) 10 MG tablet TAKE 1 TABLET DAILY  90 tablet  0  . vitamin E (VITAMIN E) 1000 UNIT capsule Take 1,000 Units by mouth daily.          Results for orders placed during the hospital encounter of 05/22/13 (from the past 48 hour(s))  CBC WITH DIFFERENTIAL     Status: Abnormal   Collection Time    05/22/13 11:27 AM      Result Value Range   WBC 8.2  4.0 - 10.5 K/uL   RBC 4.43  3.87 - 5.11 MIL/uL   Hemoglobin 12.6  12.0 - 15.0 g/dL   HCT 36.9  36.0 - 46.0 %   MCV 83.3  78.0 - 100.0 fL   MCH 28.4  26.0 - 34.0 pg   MCHC 34.1  30.0 - 36.0 g/dL   RDW 14.2  11.5 - 15.5 %     Platelets 167  150 - 400 K/uL   Neutrophils Relative % 84 (*) 43 - 77 %   Neutro Abs 6.9  1.7 - 7.7 K/uL   Lymphocytes Relative 11 (*) 12 - 46 %   Lymphs Abs 0.9  0.7 - 4.0 K/uL   Monocytes Relative 4  3 - 12 %   Monocytes Absolute 0.3  0.1 - 1.0 K/uL   Eosinophils Relative 1  0 - 5 %   Eosinophils Absolute 0.1  0.0 - 0.7 K/uL   Basophils Relative 0  0 - 1 %   Basophils Absolute 0.0  0.0 - 0.1 K/uL  BASIC METABOLIC PANEL     Status: Abnormal   Collection Time    05/22/13 11:27 AM      Result Value Range   Sodium 141  137 - 147 mEq/L   Potassium 4.0  3.7 - 5.3 mEq/L   Chloride 102  96 - 112 mEq/L   CO2 27  19 - 32 mEq/L   Glucose, Bld 103 (*) 70 - 99 mg/dL   BUN 20  6 - 23 mg/dL   Creatinine, Ser 1.08  0.50 - 1.10 mg/dL   Calcium 9.1  8.4 - 10.5 mg/dL   GFR calc non Af Amer 46 (*) >90 mL/min   GFR calc Af Amer 54 (*) >90 mL/min   Comment: (NOTE)     The eGFR has been calculated using the CKD EPI equation.     This calculation has not been validated in all clinical situations.     eGFR's persistently <90 mL/min signify possible Chronic Kidney     Disease.  TYPE AND SCREEN     Status: None   Collection Time    05/22/13 11:37 AM      Result Value Range   ABO/RH(D) O POS     Antibody Screen PENDING     Sample Expiration 05/25/2013     Dg Ankle Complete Right  05/22/2013   CLINICAL DATA:  Fall, lateral malleolus pain  EXAM: RIGHT ANKLE - COMPLETE 3+ VIEW  COMPARISON:  None.  FINDINGS: Three views of the right ankle submitted. There is displaced oblique fracture in distal right fibula There is significant disruption of ankle mortise with medial displacement of distal tibia  and fibula. There is widening of medial tibiotalar space.  IMPRESSION: Displaced oblique fracture in distal right fibula. Disruption of ankle mortise with widening of medial tibial talar space and medial displacement of distal tibia and fibula.   Electronically Signed   By: Lahoma Crocker M.D.   On: 05/22/2013 10:48    Dg Chest Portable 1 View  05/22/2013   CLINICAL DATA:  Right ankle fracture, preoperative evaluation  EXAM: PORTABLE CHEST - 1 VIEW  COMPARISON:  10/18/2008.  FINDINGS: Borderline cardiac enlargement with vascular congestion. No CHF or pneumonia. No pleural effusion or pneumothorax. Atherosclerosis of the aorta. Trachea is midline. Degenerative changes of the spine and shoulders  IMPRESSION: Cardiomegaly without acute process.  Stable exam   Electronically Signed   By: Daryll Brod M.D.   On: 05/22/2013 11:41    Review of Systems  Musculoskeletal: Positive for falls [place] this morning taking out the trash and hurt her right ankle.).    Blood pressure 147/68, pulse 53, temperature 98 F (36.7 C), temperature source Oral, resp. rate 18, height '5\' 3"'  (1.6 m), weight 73.483 kg (162 lb), SpO2 96.00%. Physical Exam  Constitutional: She is oriented to person, place, and time. She appears well-developed and well-nourished.  HENT:  Head: Normocephalic.  Eyes: Conjunctivae and EOM are normal. Pupils are equal, round, and reactive to light.  Neck: Normal range of motion. Neck supple.  Cardiovascular: Normal rate, regular rhythm, normal heart sounds and intact distal pulses.   Respiratory: Effort normal.  GI: Soft. Bowel sounds are normal.  Musculoskeletal: She exhibits tenderness (Pain, deformity right ankle, decreased ROM.  NV intact.).       Right ankle: She exhibits normal range of motion, no swelling, no ecchymosis and no deformity. Tenderness. Lateral malleolus tenderness found.       Feet:  Neurological: She is alert and oriented to person, place, and time. She has normal reflexes.  Skin: Skin is warm and dry.  Psychiatric: She has a normal mood and affect. Her behavior is normal. Judgment and thought content normal.     Assessment/Plan Fracture of the lateral malleolus, displaced and disruption of the medial deltoid ligament.  For surgical correction later this early  afternoon.  Macil Crady 05/22/2013, 1:04 PM

## 2013-05-23 LAB — CBC WITH DIFFERENTIAL/PLATELET
BASOS ABS: 0 10*3/uL (ref 0.0–0.1)
Basophils Relative: 0 % (ref 0–1)
EOS PCT: 2 % (ref 0–5)
Eosinophils Absolute: 0.1 10*3/uL (ref 0.0–0.7)
HCT: 32.5 % — ABNORMAL LOW (ref 36.0–46.0)
Hemoglobin: 11.4 g/dL — ABNORMAL LOW (ref 12.0–15.0)
LYMPHS PCT: 19 % (ref 12–46)
Lymphs Abs: 1.1 10*3/uL (ref 0.7–4.0)
MCH: 29.5 pg (ref 26.0–34.0)
MCHC: 35.1 g/dL (ref 30.0–36.0)
MCV: 84.2 fL (ref 78.0–100.0)
Monocytes Absolute: 0.4 10*3/uL (ref 0.1–1.0)
Monocytes Relative: 7 % (ref 3–12)
NEUTROS ABS: 4.1 10*3/uL (ref 1.7–7.7)
Neutrophils Relative %: 72 % (ref 43–77)
PLATELETS: 149 10*3/uL — AB (ref 150–400)
RBC: 3.86 MIL/uL — ABNORMAL LOW (ref 3.87–5.11)
RDW: 14.4 % (ref 11.5–15.5)
WBC: 5.8 10*3/uL (ref 4.0–10.5)

## 2013-05-23 MED ORDER — HYDROCODONE-ACETAMINOPHEN 5-325 MG PO TABS
1.0000 | ORAL_TABLET | ORAL | Status: DC | PRN
  Administered 2013-05-23 – 2013-05-25 (×5): 1 via ORAL
  Filled 2013-05-23 (×5): qty 1

## 2013-05-23 MED ORDER — ALBUTEROL SULFATE (2.5 MG/3ML) 0.083% IN NEBU: 2.5000 mg | INHALATION_SOLUTION | RESPIRATORY_TRACT | Status: DC | PRN

## 2013-05-23 MED ORDER — MORPHINE SULFATE (PF) 1 MG/ML IV SOLN: INTRAVENOUS | Status: DC

## 2013-05-23 MED ORDER — DIPHENHYDRAMINE HCL 12.5 MG/5ML PO ELIX
12.5000 mg | ORAL_SOLUTION | Freq: Four times a day (QID) | ORAL | Status: DC | PRN
  Administered 2013-05-25: 12.5 mg via ORAL
  Filled 2013-05-23: qty 5

## 2013-05-23 MED ORDER — SEVOFLURANE IN SOLN
RESPIRATORY_TRACT | Status: AC
  Filled 2013-05-23: qty 250

## 2013-05-23 MED ORDER — SODIUM CHLORIDE 0.9 % IJ SOLN: 9.0000 mL | INTRAMUSCULAR | Status: DC | PRN

## 2013-05-23 MED ORDER — DIPHENHYDRAMINE HCL 50 MG/ML IJ SOLN: 12.5000 mg | Freq: Four times a day (QID) | INTRAMUSCULAR | Status: DC | PRN

## 2013-05-23 MED ORDER — MORPHINE SULFATE 2 MG/ML IJ SOLN
2.0000 mg | INTRAMUSCULAR | Status: DC | PRN
  Administered 2013-05-24 (×2): 2 mg via INTRAVENOUS
  Filled 2013-05-23 (×2): qty 1

## 2013-05-23 MED ORDER — NALOXONE HCL 0.4 MG/ML IJ SOLN: 0.4000 mg | INTRAMUSCULAR | Status: DC | PRN

## 2013-05-23 MED ORDER — ONDANSETRON HCL 4 MG/2ML IJ SOLN: 4.0000 mg | Freq: Four times a day (QID) | INTRAMUSCULAR | Status: DC | PRN

## 2013-05-23 NOTE — Progress Notes (Signed)
Subjective: 1 Day Post-Op Procedure(s) (LRB): OPEN REDUCTION INTERNAL FIXATION (ORIF) RIGHT ANKLE FRACTURE (Right) Patient reports pain as 4 on 0-10 scale.    Objective: Vital signs in last 24 hours: Temp:  [97.3 F (36.3 C)-98.1 F (36.7 C)] 98.1 F (36.7 C) (01/12 0344) Pulse Rate:  [53-103] 60 (01/12 0344) Resp:  [14-20] 20 (01/12 0344) BP: (128-190)/(56-78) 145/74 mmHg (01/12 0344) SpO2:  [92 %-100 %] 95 % (01/12 0344) Weight:  [73.483 kg (162 lb)] 73.483 kg (162 lb) (01/11 1258)  Intake/Output from previous day: 01/11 0701 - 01/12 0700 In: 2178.3 [P.O.:120; I.V.:2058.3] Out: 205 [Urine:200; Blood:5] Intake/Output this shift:     Recent Labs  05/22/13 1127 05/23/13 0532  HGB 12.6 11.4*    Recent Labs  05/22/13 1127 05/23/13 0532  WBC 8.2 5.8  RBC 4.43 3.86*  HCT 36.9 32.5*  PLT 167 149*    Recent Labs  05/22/13 1127  NA 141  K 4.0  CL 102  CO2 27  BUN 20  CREATININE 1.08  GLUCOSE 103*  CALCIUM 9.1   No results found for this basename: LABPT, INR,  in the last 72 hours  Neurologically intact Neurovascular intact Sensation intact distally Intact pulses distally  She rested well.  She is ready for PT today.  Assessment/Plan: 1 Day Post-Op Procedure(s) (LRB): OPEN REDUCTION INTERNAL FIXATION (ORIF) RIGHT ANKLE FRACTURE (Right) Up with therapy Will need home health.  [name] 05/23/2013, 7:18 AM

## 2013-05-23 NOTE — Care Management Note (Signed)
    Page 1 of 1   05/25/2013     11:47:29 AM   CARE MANAGEMENT NOTE 05/25/2013  Patient:  [name], [name]   Account Number:  [id number]  Date Initiated:  05/23/2013  Documentation initiated by:  Claretha Cooper  Subjective/Objective Assessment:   Pt admitted from home where she lives alone. Has family and friends to assist. Is adamant about returning home and not to rehab.     Action/Plan:   Anticipated DC Date:  05/24/2013   Anticipated DC Plan:  [place]  CM consult      Choice offered to / List presented to:  C-1 Patient        [place] arranged  [place] PT      [place].   Status of service:  Completed, signed off Medicare Important Message given?  YES (If response is "NO", the following Medicare IM given date fields will be blank) Date Medicare IM given:  05/25/2013 Date Additional Medicare IM given:    Discharge Disposition:  [place]  Per UR Regulation:    If discussed at Long Length of Stay Meetings, dates discussed:    Comments:  05/25/13 Claretha Cooper RN BSN CM Pt [place] with [facility] PT through [facility]. [employer] notifed. HH should begin within 48 hrs  05/23/13 Claretha Cooper RN BSN CN Pt evaluated by PT and is adamant that she will go home. Agreed to [facility] PT. Pt to check if there is a WC with an elevated leg attachment availalbe to her. Pt also checking to see if a friend can build her a ramp. Will f/u.

## 2013-05-23 NOTE — Evaluation (Signed)
Physical Therapy Evaluation Patient Details Name: [name] MRN: [id number] DOB: 26-Sep-1928 Today's Date: 05/23/2013 Time: 9937-1696 PT Time Calculation (min): 44 min  PT Assessment / Plan / Recommendation History of Present Illness  Pt is admitted with a right lateral malleolar ankle fx.  She had an OTIF done on 05-22-13.  She lives alone and is normally independent with all ADLs  Clinical Impression   Pt was seen for eval/tx.  She was instructed instructed in gait with walker, TTWB right.  She was able to ambulate 4' with a walker and min assist.  Her major problem will be the steps to enter her home.  We have recommended that a ramp be obtained to minimize her fall risk.  She should be able to transition to home at d/c.    PT Assessment  Patient needs continued PT services    Follow Up Recommendations  Home health PT    Does the patient have the potential to tolerate intense rehabilitation      Barriers to Discharge Inaccessible home environment has 3 steps to enter the home    Equipment Recommendations  Rolling walker with 5" wheels;Wheelchair (measurements PT)    Recommendations for Other Services     Frequency 7X/week    Precautions / Restrictions Precautions Precautions: Fall Restrictions Weight Bearing Restrictions: Yes RLE Weight Bearing: Touchdown weight bearing   Pertinent Vitals/Pain       Mobility  Bed Mobility Overal bed mobility: Modified Independent Transfers Overall transfer level: Needs assistance Equipment used: Rolling walker (2 wheeled) Transfers: Sit to/from Stand Sit to Stand: Min guard General transfer comment: teaching for hand placement Ambulation/Gait Ambulation/Gait assistance: Min assist Ambulation Distance (Feet): 15 Feet Assistive device: Rolling walker (2 wheeled) Gait Pattern/deviations: Decreased step length - right Gait velocity: slow but stable General Gait Details: pt is unable to lift LLU up off of floor to step...she  has carpeting at home which will make gait difficult    Exercises     PT Diagnosis: Difficulty walking;Acute pain  PT Problem List: Decreased activity tolerance;Decreased mobility;Decreased knowledge of use of DME;Decreased knowledge of precautions;Pain PT Treatment Interventions: DME instruction;Gait training;Stair training;Functional mobility training     PT Goals(Current goals can be found in the care plan section) Acute Rehab PT Goals Patient Stated Goal: wants to go home PT Goal Formulation: With patient Time For Goal Achievement: 06/06/13 Potential to Achieve Goals: Good  Visit Information  Last PT Received On: 05/23/13 History of Present Illness: Pt is admitted with a right lateral malleolar ankle fx.  She had an OTIF done on 05-22-13.  She lives alone and is normally independent with all ADLs       Prior [place] expects to be discharged to:: Private residence Living Arrangements: Alone Available Help at Discharge: Family;Friend(s);Available 24 hours/day Type of Home: House Home Access: Stairs to enter [employer] of Steps: 3 Entrance Stairs-Rails: Right Home Layout: One level Home Equipment: Cane - quad;Bedside commode Additional Comments: pt thinks that she can borrow a walker and w/c Prior Function Level of Independence: Independent Communication Communication: No difficulties    Cognition  Cognition Arousal/Alertness: Awake/alert Behavior During Therapy: WFL for tasks assessed/performed Overall Cognitive Status: Within Functional Limits for tasks assessed    Extremity/Trunk Assessment Lower Extremity Assessment Lower Extremity Assessment: Overall WFL for tasks assessed (right ankle is in posterior splint)   Balance Balance Overall balance assessment: No apparent balance deficits (not formally assessed)  End of Session PT - End of  Session Equipment Utilized During Treatment: Gait belt Activity Tolerance: Patient  tolerated treatment well Patient left: in chair;with call bell/phone within reach Nurse Communication: Mobility status  GP     Sable Feil 05/23/2013, 9:42 AM

## 2013-05-23 NOTE — Op Note (Signed)
NAMEJAMAIYAH, PYLE             ACCOUNT NO.:  [id number]  MEDICAL RECORD NO.:  [id number]  LOCATION:  A321                          FACILITY:  [facility]  PHYSICIAN:  J. Sanjuana Kava, M.D. DATE OF BIRTH:  1929-01-04  DATE OF PROCEDURE: DATE OF DISCHARGE:                              OPERATIVE REPORT   PREOPERATIVE DIAGNOSIS:  Fracture of the lateral malleolus of the right ankle with disruption of the medial deltoid ligament and lateral displacement of the ankle.  POSTOPERATIVE DIAGNOSIS:  Fracture of the lateral malleolus of the right ankle with disruption of the medial deltoid ligament and lateral displacement of the ankle.  PROCEDURE:  Open treatment and internal reduction of lateral malleolus fracture and repair of the deltoid ligament medially of the right ankle. Application of a posterior splint.  ANESTHESIA:  General.  TOURNIQUET TIME:  31 minutes.  DRAINS:  No drains.  Posterior splint applied at the end of the procedure.  SURGEON:  J. Sanjuana Kava, M.D.  INDICATIONS:  The patient fell today while taking her trash and slipped and fell and injured her right ankle.  She had deformity.  She had disruption of the medial side of the ankle with wide opening of the mortise and lateral displacement of the lateral malleolus fracture. Surgery was recommended.  I went over the risks and imponderables of the procedure with the patient.  She appeared to understand and agreed to the procedure.  DESCRIPTION OF PROCEDURE:  The patient was seen in the holding area. The right ankle was identified as correct surgical site.  I placed a mark on the right ankle.  She was brought to the operating room and given general anesthesia while supine.  Tourniquet placed and deflated in right upper thigh.  She was prepped and draped in usual manner.  We had a generalized time-out identifying the patient as Ms. Aline Brochure. We are doing a right ankle fracture and deltoid ligament repair.   All instrumentation was properly positioned and working.  Everyone in the room knew each other.  Leg after being prepped and draped was elevated, wrapped circumferentially with an Esmarch bandage.  Tourniquet inflated to 300 mmHg.  Esmarch bandage removed.  Midline incision was made and hematoma was expressed.  Joint was inspected and there was no apparent fracture. The deltoid ligament was then repaired primarily using #1 Surgilon suture in an interrupted figure-of-eight fashion.  Good strong repair was obtained.  Attention directed to the lateral side.  Fracture was anatomically reduced to the lateral malleolus and a 6-hole plate was selected.  Drill holes were made.  Most proximal screw was a cortical then a locking screw and 3 cortical screws, the most inferior screw was a cancellous screw.  Prompt pictures were taken.  She had good repair and reduction of the mortise and the fracture.  Wounds were reapproximated using 2-0 chromic, 2-0 plain, and skin staples.  Sterile dressing applied.  Bulky dressing applied.  Sterile sheet cotton applied.  Posterior splint applied.  Tourniquet deflated for 31 minutes. The patient tolerated the procedure well, will go to recovery in good condition.          ______________________________ Lenna Sciara. Sanjuana Kava, M.D.  JWK/MEDQ  D:  05/22/2013  T:  05/23/2013  Job:  [id number]

## 2013-05-23 NOTE — Anesthesia Postprocedure Evaluation (Signed)
  Anesthesia Post-op Note  Patient: [name]  Procedure(s) Performed: Procedure(s): OPEN REDUCTION INTERNAL FIXATION (ORIF) RIGHT ANKLE FRACTURE (Right)  Patient Location:Room 321  Anesthesia Type:General  Level of Consciousness: awake, alert , oriented and patient cooperative  Airway and Oxygen Therapy: Patient Spontanous Breathing  Post-op Pain: mild  Post-op Assessment: Post-op Vital signs reviewed, Patient's Cardiovascular Status Stable, Respiratory Function Stable, Patent Airway, No signs of Nausea or vomiting, Adequate PO intake and Pain level controlled  Post-op Vital Signs: Reviewed and stable  Complications: No apparent anesthesia complications

## 2013-05-23 NOTE — Progress Notes (Signed)
Utilization Review Complete  

## 2013-05-24 ENCOUNTER — Encounter (HOSPITAL_COMMUNITY): Payer: Self-pay | Admitting: Orthopaedic Surgery

## 2013-05-24 LAB — CBC WITH DIFFERENTIAL/PLATELET
Basophils Absolute: 0 10*3/uL (ref 0.0–0.1)
Basophils Relative: 0 % (ref 0–1)
EOS PCT: 2 % (ref 0–5)
Eosinophils Absolute: 0.1 10*3/uL (ref 0.0–0.7)
HCT: 32.8 % — ABNORMAL LOW (ref 36.0–46.0)
Hemoglobin: 11.6 g/dL — ABNORMAL LOW (ref 12.0–15.0)
LYMPHS ABS: 1.4 10*3/uL (ref 0.7–4.0)
Lymphocytes Relative: 20 % (ref 12–46)
MCH: 29.7 pg (ref 26.0–34.0)
MCHC: 35.4 g/dL (ref 30.0–36.0)
MCV: 83.9 fL (ref 78.0–100.0)
Monocytes Absolute: 0.6 10*3/uL (ref 0.1–1.0)
Monocytes Relative: 9 % (ref 3–12)
Neutro Abs: 4.7 10*3/uL (ref 1.7–7.7)
Neutrophils Relative %: 69 % (ref 43–77)
PLATELETS: 142 10*3/uL — AB (ref 150–400)
RBC: 3.91 MIL/uL (ref 3.87–5.11)
RDW: 14 % (ref 11.5–15.5)
WBC: 6.8 10*3/uL (ref 4.0–10.5)

## 2013-05-24 LAB — BASIC METABOLIC PANEL
BUN: 14 mg/dL (ref 6–23)
CALCIUM: 8.7 mg/dL (ref 8.4–10.5)
CHLORIDE: 100 meq/L (ref 96–112)
CO2: 30 meq/L (ref 19–32)
Creatinine, Ser: 0.97 mg/dL (ref 0.50–1.10)
GFR calc Af Amer: 61 mL/min — ABNORMAL LOW (ref >90)
GFR calc non Af Amer: 53 mL/min — ABNORMAL LOW (ref >90)
Glucose, Bld: 110 mg/dL — ABNORMAL HIGH (ref 70–99)
Potassium: 3.5 mEq/L — ABNORMAL LOW (ref 3.7–5.3)
Sodium: 143 mEq/L (ref 137–147)

## 2013-05-24 NOTE — Progress Notes (Signed)
Physical Therapy Treatment Patient Details Name: [name] MRN: [id number] DOB: 01-14-1929 Today's Date: 05/24/2013 Time: 7494-4967 PT Time Calculation (min): 26 min  PT Assessment / Plan / Recommendation  History of Present Illness Pt has no c/o today, minimal pain in the ankle   PT Comments   Gait endurance and stability is improving daily.  She is maintaining good TTWB.  We will plan to instruct in steps this afternoon if possible.  Follow Up Recommendations        Does the patient have the potential to tolerate intense rehabilitation     Barriers to Discharge        Equipment Recommendations       Recommendations for Other Services    Frequency     Progress towards PT Goals Progress towards PT goals: Progressing toward goals  Plan Current plan remains appropriate    Precautions / Restrictions Restrictions Weight Bearing Restrictions: Yes RLE Weight Bearing: Touchdown weight bearing   Pertinent Vitals/Pain     Mobility  Bed Mobility Overal bed mobility: Modified Independent Transfers Equipment used: Rolling walker (2 wheeled) Sit to Stand: Supervision Ambulation/Gait Ambulation/Gait assistance: Supervision Ambulation Distance (Feet): 25 Feet (also walked 15' x 1) Assistive device: Rolling walker (2 wheeled)    Exercises     PT Diagnosis:    PT Problem List:   PT Treatment Interventions:     PT Goals (current goals can now be found in the care plan section)    Visit Information  Last PT Received On: 05/24/13 History of Present Illness: Pt has no c/o today, minimal pain in the ankle    Subjective Data      Cognition       Balance     End of Session PT - End of Session Equipment Utilized During Treatment: Gait belt Activity Tolerance: Patient tolerated treatment well Patient left: in chair;with call bell/phone within reach Nurse Communication: Mobility status   GP     Demetrios Isaacs L 05/24/2013, 9:30 AM

## 2013-05-24 NOTE — Progress Notes (Signed)
Physical Therapy Treatment Patient Details Name: [name] MRN: [id number] DOB: 03/17/1929 Today's Date: 05/24/2013 Time: 0037-9444 PT Time Calculation (min): 29 min  PT Assessment / Plan / Recommendation  History of Present Illness Continues to feel well   PT Comments   Pt was seen for instruction in steps.  She was instructed in use of railing on 3 steps, needing min assist.  She has met current acute PT goals.  She should be able to transition to home at d/c with HHPT.  Follow Up Recommendations        Does the patient have the potential to tolerate intense rehabilitation     Barriers to Discharge        Equipment Recommendations   (pt has a wheelchair at home)    Recommendations for Other Services    Frequency     Progress towards PT Goals Progress towards PT goals: Goals met/education completed, patient discharged from PT  Plan Current plan remains appropriate;Equipment recommendations need to be updated    Precautions / Restrictions     Pertinent Vitals/Pain     Mobility  Transfers Sit to Stand: Supervision General transfer comment: teaching needed for safe transfers...tends to sit prematurely Ambulation/Gait Ambulation/Gait assistance: Supervision Ambulation Distance (Feet): 30 Feet Assistive device: Rolling walker (2 wheeled) Stairs: Yes Stairs assistance: Min assist Stair Management: Sideways;One rail Right Number of Stairs: 3    Exercises     PT Diagnosis:    PT Problem List:   PT Treatment Interventions:     PT Goals (current goals can now be found in the care plan section)    Visit Information  History of Present Illness: Continues to feel well    Subjective Data      Cognition       Balance     End of Session PT - End of Session Equipment Utilized During Treatment: Gait belt Activity Tolerance: Patient tolerated treatment well Patient left: in bed;with call bell/phone within reach   GP     Demetrios Isaacs L 05/24/2013, 4:10  PM

## 2013-05-24 NOTE — Progress Notes (Signed)
Patient ambulating short distances into hallways with assist.  Tolerating well.

## 2013-05-24 NOTE — Progress Notes (Signed)
Subjective: 2 Days Post-Op Procedure(s) (LRB): OPEN REDUCTION INTERNAL FIXATION (ORIF) RIGHT ANKLE FRACTURE (Right) Patient reports pain as 3 on 0-10 scale.    Objective: Vital signs in last 24 hours: Temp:  [98.2 F (36.8 C)-98.5 F (36.9 C)] 98.4 F (36.9 C) (01/13 0419) Pulse Rate:  [60-73] 65 (01/13 0419) Resp:  [20] 20 (01/13 0419) BP: (112-149)/(58-72) 112/72 mmHg (01/13 0419) SpO2:  [92 %-99 %] 92 % (01/13 0419)  Intake/Output from previous day: 01/12 0701 - 01/13 0700 In: 2820 [P.O.:420; I.V.:2400] Out: 975 [Urine:975] Intake/Output this shift:     Recent Labs  05/22/13 1127 05/23/13 0532 05/24/13 0548  HGB 12.6 11.4* 11.6*    Recent Labs  05/23/13 0532 05/24/13 0548  WBC 5.8 6.8  RBC 3.86* 3.91  HCT 32.5* 32.8*  PLT 149* 142*    Recent Labs  05/22/13 1127 05/24/13 0548  NA 141 143  K 4.0 3.5*  CL 102 100  CO2 27 30  BUN 20 14  CREATININE 1.08 0.97  GLUCOSE 103* 110*  CALCIUM 9.1 8.7   No results found for this basename: LABPT, INR,  in the last 72 hours  Neurologically intact Neurovascular intact Sensation intact distally Intact pulses distally  She had episodes of pain yesterday evening and had to have the PCA restarted.  I will see how she does today with PT and pain control.  Plan discharge tomorrow with home health.  Assessment/Plan: 2 Days Post-Op Procedure(s) (LRB): OPEN REDUCTION INTERNAL FIXATION (ORIF) RIGHT ANKLE FRACTURE (Right) Up with therapy Discharge home with home health tomorrow.  Deroy Noah 05/24/2013, 7:42 AM

## 2013-05-24 NOTE — ED Provider Notes (Signed)
Medical screening examination/treatment/procedure(s) were conducted as a shared visit with non-physician practitioner(s) and myself.  I personally evaluated the patient during the encounter.  EKG Interpretation    Date/Time:    Ventricular Rate:    PR Interval:    QRS Duration:   QT Interval:    QTC Calculation:   R Axis:     Text Interpretation:                Sharyon Cable, MD 05/24/13 [phone]

## 2013-05-25 LAB — CBC WITH DIFFERENTIAL/PLATELET
Basophils Absolute: 0 10*3/uL (ref 0.0–0.1)
Basophils Relative: 0 % (ref 0–1)
EOS ABS: 0.2 10*3/uL (ref 0.0–0.7)
EOS PCT: 3 % (ref 0–5)
HCT: 30.5 % — ABNORMAL LOW (ref 36.0–46.0)
HEMOGLOBIN: 10.8 g/dL — AB (ref 12.0–15.0)
LYMPHS ABS: 1.3 10*3/uL (ref 0.7–4.0)
Lymphocytes Relative: 20 % (ref 12–46)
MCH: 29.8 pg (ref 26.0–34.0)
MCHC: 35.4 g/dL (ref 30.0–36.0)
MCV: 84 fL (ref 78.0–100.0)
MONOS PCT: 7 % (ref 3–12)
Monocytes Absolute: 0.4 10*3/uL (ref 0.1–1.0)
Neutro Abs: 4.5 10*3/uL (ref 1.7–7.7)
Neutrophils Relative %: 71 % (ref 43–77)
Platelets: 119 10*3/uL — ABNORMAL LOW (ref 150–400)
RBC: 3.63 MIL/uL — AB (ref 3.87–5.11)
RDW: 13.8 % (ref 11.5–15.5)
WBC: 6.4 10*3/uL (ref 4.0–10.5)

## 2013-05-25 LAB — BASIC METABOLIC PANEL
BUN: 13 mg/dL (ref 6–23)
CO2: 32 mEq/L (ref 19–32)
Calcium: 8.6 mg/dL (ref 8.4–10.5)
Chloride: 100 mEq/L (ref 96–112)
Creatinine, Ser: 0.92 mg/dL (ref 0.50–1.10)
GFR calc Af Amer: 65 mL/min — ABNORMAL LOW (ref >90)
GFR calc non Af Amer: 56 mL/min — ABNORMAL LOW (ref >90)
GLUCOSE: 113 mg/dL — AB (ref 70–99)
POTASSIUM: 3.5 meq/L — AB (ref 3.7–5.3)
SODIUM: 142 meq/L (ref 137–147)

## 2013-05-25 MED ORDER — OXYCODONE-ACETAMINOPHEN 5-325 MG PO TABS: 1.0000 | ORAL_TABLET | ORAL | Status: DC | PRN

## 2013-05-25 NOTE — Discharge Summary (Signed)
Physician Discharge Summary  Patient ID: [name] MRN: [id number] DOB/AGE: [birth year] 81 y.o.  Admit date: 05/22/2013 Discharge date: 05/25/2013  Admission Diagnoses: Fracture of the right lateral malleolus and rupture of medial deltoid collateral ligament of right ankle  Discharge Diagnoses:  Active Problems:   Fracture of right ankle Fracture of the right lateral malleolus and rupture of the medial deltoid collateral ligament of the right ankle.  Discharged Condition: good  Hospital Course: She was seen via the ER for a fracture displaced of the right ankle on the day of admission.  She was taken to the operating room and had surgical fixation of the ankle.  She was admitted after surgery.  She had PCA for pain control.  She was seen by physical therapy and is now able to use a walker independently with touch down weight bearing.  She will be discharged on oxycodone for pain.  Home health has been arranged for physical therapy.  She remained afebrile in the hospital and labs were normal.  Consults: None  Significant Diagnostic Studies: labs: normal range and radiology: X-Ray: showed displaced laterally fracture of the right ankle and post operative views showed a six hole plate and screws with reduction of the fracture and proper alignment of the ankle mortise.  Treatments: surgery: open treatment and internal reduction of the lateral malleolus fracture of the right ankle and primary repair of the medial deltoid collateral ligament of the ankle on the right.  Placement of posterior splint.  Discharge Exam: Blood pressure 133/69, pulse 67, temperature 97.8 F (36.6 C), temperature source Oral, resp. rate 20, height 5\' 3"  (1.6 m), weight 73.483 kg (162 lb), SpO2 95.00%. General appearance: alert and cooperative.  Vital signs normal.  Able to use walker appropriately.  Posterior splint right ankle.  Neurovascular intact.   Disposition: 01-Home or Self Care  Discharge Orders    Future Appointments Provider Department Dept Phone   06/23/2013 10:15 AM Fayrene Helper, MD [facility] [phone]   Future Orders Complete By Expires   Call MD / Call 911  As directed    Comments:     If you experience chest pain or shortness of breath, CALL 911 and be transported to the hospital emergency room.  If you develope a fever above 101 F, pus (white drainage) or increased drainage or redness at the wound, or calf pain, call your surgeon's office.   Constipation Prevention  As directed    Comments:     Drink plenty of fluids.  Prune juice may be helpful.  You may use a stool softener, such as Colace (over the counter) 100 mg twice a day.  Use MiraLax (over the counter) for constipation as needed.   Diet - low sodium heart healthy  As directed    Discharge instructions  As directed    Comments:     Use walker.  Only touch down on right foot/ankle.  Elevate foot.  Do not remove dressing or splint.  If any problems, call Dr. Brooke Bonito office at [phone] or if after hours, hospital [phone].  Keep appointment with Dr. Luna Glasgow in two weeks.   Increase activity slowly as tolerated  As directed        Medication List         acetaminophen 500 MG tablet  Commonly known as:  TYLENOL  Take 1,000 mg by mouth every 6 (six) hours as needed for moderate pain.     aspirin 81 MG EC tablet  Take 81  mg by mouth daily.     AZOR 10-40 MG per tablet  Generic drug:  amLODipine-olmesartan  TAKE 1 TABLET DAILY     cetirizine 10 MG tablet  Commonly known as:  ZYRTEC  TAKE ONE TABLET BY MOUTH EVERY DAY     clonazePAM 0.5 MG tablet  Commonly known as:  KLONOPIN  Take 1 tablet (0.5 mg total) by mouth 2 (two) times daily.     cycloSPORINE 0.05 % ophthalmic emulsion  Commonly known as:  RESTASIS  Place 1 drop into both eyes 2 (two) times daily.     hydrochlorothiazide 12.5 MG capsule  Commonly known as:  MICROZIDE  TAKE 1 CAPSULE DAILY     nebivolol 10 MG tablet   Commonly known as:  BYSTOLIC  TAKE 1 TABLET DAILY     oxyCODONE-acetaminophen 5-325 MG per tablet  Commonly known as:  ROXICET  Take 1 tablet by mouth every 4 (four) hours as needed for severe pain.     vitamin E 1000 UNIT capsule  Take 1,000 Units by mouth daily.           Follow-up Information   Follow up with Sanjuana Kava, MD In 2 weeks.   Specialty:  Orthopedic Surgery   Contact information:   [place] [address] [phone]       Signed: Sanjuana Kava 05/25/2013, 9:29 AM

## 2013-05-25 NOTE — Progress Notes (Signed)
IV removed. Discharge instructions reviewed with patient. Understanding verbalized. Patient to go by Dr. Luna Glasgow office for pain medication prescription.

## 2013-05-25 NOTE — Progress Notes (Signed)
Subjective: 3 Days Post-Op Procedure(s) (LRB): OPEN REDUCTION INTERNAL FIXATION (ORIF) RIGHT ANKLE FRACTURE (Right) Patient reports pain as mild.    Objective: Vital signs in last 24 hours: Temp:  [97.8 F (36.6 C)-98.6 F (37 C)] 97.8 F (36.6 C) (01/14 0605) Pulse Rate:  [64-71] 67 (01/14 0605) Resp:  [20] 20 (01/14 0605) BP: (133-137)/(56-69) 133/69 mmHg (01/13 2306) SpO2:  [94 %-98 %] 95 % (01/14 0605)  Intake/Output from previous day: 01/13 0701 - 01/14 0700 In: 3166.7 [P.O.:720; I.V.:2446.7] Out: 1550 [Urine:1550] Intake/Output this shift:     Recent Labs  05/22/13 1127 05/23/13 0532 05/24/13 0548 05/25/13 0543  HGB 12.6 11.4* 11.6* 10.8*    Recent Labs  05/24/13 0548 05/25/13 0543  WBC 6.8 6.4  RBC 3.91 3.63*  HCT 32.8* 30.5*  PLT 142* 119*    Recent Labs  05/24/13 0548 05/25/13 0543  NA 143 142  K 3.5* 3.5*  CL 100 100  CO2 30 32  BUN 14 13  CREATININE 0.97 0.92  GLUCOSE 110* 113*  CALCIUM 8.7 8.6   No results found for this basename: LABPT, INR,  in the last 72 hours  Neurologically intact Neurovascular intact Sensation intact distally Intact pulses distally Dorsiflexion/Plantar flexion intact  Assessment/Plan: 3 Days Post-Op Procedure(s) (LRB): OPEN REDUCTION INTERNAL FIXATION (ORIF) RIGHT ANKLE FRACTURE (Right) Discharge home with home health  Fread Kottke 05/25/2013, 9:23 AM

## 2013-05-25 NOTE — Discharge Instructions (Signed)
Elevate foot as needed.  Use walker.  Toe touch only to right foot/ankle.  Keep appointment with Dr. Luna Glasgow in two weeks.  Call his office if any problem, [phone].

## 2013-05-26 DIAGNOSIS — F329 Major depressive disorder, single episode, unspecified: Secondary | ICD-10-CM | POA: Diagnosis not present

## 2013-05-26 DIAGNOSIS — S8263XA Displaced fracture of lateral malleolus of unspecified fibula, initial encounter for closed fracture: Secondary | ICD-10-CM | POA: Diagnosis not present

## 2013-05-26 DIAGNOSIS — F3289 Other specified depressive episodes: Secondary | ICD-10-CM | POA: Diagnosis not present

## 2013-05-26 DIAGNOSIS — IMO0001 Reserved for inherently not codable concepts without codable children: Secondary | ICD-10-CM | POA: Diagnosis not present

## 2013-05-26 DIAGNOSIS — F411 Generalized anxiety disorder: Secondary | ICD-10-CM | POA: Diagnosis not present

## 2013-05-26 DIAGNOSIS — I10 Essential (primary) hypertension: Secondary | ICD-10-CM | POA: Diagnosis not present

## 2013-05-27 ENCOUNTER — Telehealth: Payer: Self-pay | Admitting: Family Medicine

## 2013-05-27 NOTE — Telephone Encounter (Signed)
Certainly it is please do

## 2013-05-27 NOTE — Telephone Encounter (Signed)
Ok to give verbal ok for this. 

## 2013-05-27 NOTE — Telephone Encounter (Signed)
Called and left message to give verbal order and told him to call me back at the office

## 2013-05-30 DIAGNOSIS — F3289 Other specified depressive episodes: Secondary | ICD-10-CM | POA: Diagnosis not present

## 2013-05-30 DIAGNOSIS — IMO0001 Reserved for inherently not codable concepts without codable children: Secondary | ICD-10-CM | POA: Diagnosis not present

## 2013-05-30 DIAGNOSIS — I10 Essential (primary) hypertension: Secondary | ICD-10-CM | POA: Diagnosis not present

## 2013-05-30 DIAGNOSIS — F329 Major depressive disorder, single episode, unspecified: Secondary | ICD-10-CM | POA: Diagnosis not present

## 2013-05-30 DIAGNOSIS — F411 Generalized anxiety disorder: Secondary | ICD-10-CM | POA: Diagnosis not present

## 2013-05-30 NOTE — Telephone Encounter (Signed)
Verbal order given by Loma Sousa

## 2013-05-31 DIAGNOSIS — F3289 Other specified depressive episodes: Secondary | ICD-10-CM | POA: Diagnosis not present

## 2013-05-31 DIAGNOSIS — F411 Generalized anxiety disorder: Secondary | ICD-10-CM | POA: Diagnosis not present

## 2013-05-31 DIAGNOSIS — IMO0001 Reserved for inherently not codable concepts without codable children: Secondary | ICD-10-CM | POA: Diagnosis not present

## 2013-05-31 DIAGNOSIS — I10 Essential (primary) hypertension: Secondary | ICD-10-CM | POA: Diagnosis not present

## 2013-05-31 DIAGNOSIS — F329 Major depressive disorder, single episode, unspecified: Secondary | ICD-10-CM | POA: Diagnosis not present

## 2013-06-02 DIAGNOSIS — F411 Generalized anxiety disorder: Secondary | ICD-10-CM | POA: Diagnosis not present

## 2013-06-02 DIAGNOSIS — F3289 Other specified depressive episodes: Secondary | ICD-10-CM | POA: Diagnosis not present

## 2013-06-02 DIAGNOSIS — IMO0001 Reserved for inherently not codable concepts without codable children: Secondary | ICD-10-CM | POA: Diagnosis not present

## 2013-06-02 DIAGNOSIS — F329 Major depressive disorder, single episode, unspecified: Secondary | ICD-10-CM | POA: Diagnosis not present

## 2013-06-02 DIAGNOSIS — I10 Essential (primary) hypertension: Secondary | ICD-10-CM | POA: Diagnosis not present

## 2013-06-03 DIAGNOSIS — F3289 Other specified depressive episodes: Secondary | ICD-10-CM | POA: Diagnosis not present

## 2013-06-03 DIAGNOSIS — F329 Major depressive disorder, single episode, unspecified: Secondary | ICD-10-CM | POA: Diagnosis not present

## 2013-06-03 DIAGNOSIS — F411 Generalized anxiety disorder: Secondary | ICD-10-CM | POA: Diagnosis not present

## 2013-06-03 DIAGNOSIS — I10 Essential (primary) hypertension: Secondary | ICD-10-CM | POA: Diagnosis not present

## 2013-06-03 DIAGNOSIS — IMO0001 Reserved for inherently not codable concepts without codable children: Secondary | ICD-10-CM | POA: Diagnosis not present

## 2013-06-06 DIAGNOSIS — IMO0001 Reserved for inherently not codable concepts without codable children: Secondary | ICD-10-CM | POA: Diagnosis not present

## 2013-06-06 DIAGNOSIS — I10 Essential (primary) hypertension: Secondary | ICD-10-CM | POA: Diagnosis not present

## 2013-06-06 DIAGNOSIS — F329 Major depressive disorder, single episode, unspecified: Secondary | ICD-10-CM | POA: Diagnosis not present

## 2013-06-06 DIAGNOSIS — F411 Generalized anxiety disorder: Secondary | ICD-10-CM | POA: Diagnosis not present

## 2013-06-06 DIAGNOSIS — F3289 Other specified depressive episodes: Secondary | ICD-10-CM | POA: Diagnosis not present

## 2013-06-07 DIAGNOSIS — I10 Essential (primary) hypertension: Secondary | ICD-10-CM | POA: Diagnosis not present

## 2013-06-07 DIAGNOSIS — F329 Major depressive disorder, single episode, unspecified: Secondary | ICD-10-CM | POA: Diagnosis not present

## 2013-06-07 DIAGNOSIS — F3289 Other specified depressive episodes: Secondary | ICD-10-CM | POA: Diagnosis not present

## 2013-06-07 DIAGNOSIS — F411 Generalized anxiety disorder: Secondary | ICD-10-CM | POA: Diagnosis not present

## 2013-06-07 DIAGNOSIS — IMO0001 Reserved for inherently not codable concepts without codable children: Secondary | ICD-10-CM | POA: Diagnosis not present

## 2013-06-08 DIAGNOSIS — F3289 Other specified depressive episodes: Secondary | ICD-10-CM | POA: Diagnosis not present

## 2013-06-08 DIAGNOSIS — F411 Generalized anxiety disorder: Secondary | ICD-10-CM | POA: Diagnosis not present

## 2013-06-08 DIAGNOSIS — I10 Essential (primary) hypertension: Secondary | ICD-10-CM | POA: Diagnosis not present

## 2013-06-08 DIAGNOSIS — F329 Major depressive disorder, single episode, unspecified: Secondary | ICD-10-CM | POA: Diagnosis not present

## 2013-06-08 DIAGNOSIS — IMO0001 Reserved for inherently not codable concepts without codable children: Secondary | ICD-10-CM | POA: Diagnosis not present

## 2013-06-08 DIAGNOSIS — S8263XA Displaced fracture of lateral malleolus of unspecified fibula, initial encounter for closed fracture: Secondary | ICD-10-CM | POA: Diagnosis not present

## 2013-06-10 DIAGNOSIS — F329 Major depressive disorder, single episode, unspecified: Secondary | ICD-10-CM | POA: Diagnosis not present

## 2013-06-10 DIAGNOSIS — F411 Generalized anxiety disorder: Secondary | ICD-10-CM | POA: Diagnosis not present

## 2013-06-10 DIAGNOSIS — F3289 Other specified depressive episodes: Secondary | ICD-10-CM | POA: Diagnosis not present

## 2013-06-10 DIAGNOSIS — IMO0001 Reserved for inherently not codable concepts without codable children: Secondary | ICD-10-CM | POA: Diagnosis not present

## 2013-06-10 DIAGNOSIS — I10 Essential (primary) hypertension: Secondary | ICD-10-CM | POA: Diagnosis not present

## 2013-06-19 ENCOUNTER — Other Ambulatory Visit: Payer: Self-pay | Admitting: Family Medicine

## 2013-06-23 ENCOUNTER — Ambulatory Visit: Payer: Medicare Other | Admitting: Family Medicine

## 2013-07-06 DIAGNOSIS — I10 Essential (primary) hypertension: Secondary | ICD-10-CM | POA: Diagnosis not present

## 2013-07-06 DIAGNOSIS — S8263XA Displaced fracture of lateral malleolus of unspecified fibula, initial encounter for closed fracture: Secondary | ICD-10-CM | POA: Diagnosis not present

## 2013-07-08 ENCOUNTER — Other Ambulatory Visit: Payer: Self-pay | Admitting: Family Medicine

## 2013-07-08 DIAGNOSIS — Z1231 Encounter for screening mammogram for malignant neoplasm of breast: Secondary | ICD-10-CM

## 2013-07-14 ENCOUNTER — Other Ambulatory Visit: Payer: Self-pay | Admitting: Family Medicine

## 2013-07-20 DIAGNOSIS — I10 Essential (primary) hypertension: Secondary | ICD-10-CM | POA: Diagnosis not present

## 2013-07-20 DIAGNOSIS — S8263XA Displaced fracture of lateral malleolus of unspecified fibula, initial encounter for closed fracture: Secondary | ICD-10-CM | POA: Diagnosis not present

## 2013-08-01 ENCOUNTER — Ambulatory Visit (HOSPITAL_COMMUNITY)
Admission: RE | Admit: 2013-08-01 | Discharge: 2013-08-01 | Disposition: A | Discharge status: Home / Self Care | Payer: Medicare Other | Source: Ambulatory Visit | Attending: Family Medicine | Admitting: Family Medicine

## 2013-08-01 ENCOUNTER — Ambulatory Visit (HOSPITAL_COMMUNITY): Payer: Medicare Other

## 2013-08-01 DIAGNOSIS — Z1231 Encounter for screening mammogram for malignant neoplasm of breast: Secondary | ICD-10-CM | POA: Insufficient documentation

## 2013-08-03 ENCOUNTER — Encounter (INDEPENDENT_AMBULATORY_CARE_PROVIDER_SITE_OTHER): Payer: Self-pay

## 2013-08-03 ENCOUNTER — Ambulatory Visit (INDEPENDENT_AMBULATORY_CARE_PROVIDER_SITE_OTHER): Payer: Medicare Other | Admitting: Family Medicine

## 2013-08-03 ENCOUNTER — Encounter: Payer: Self-pay | Admitting: Family Medicine

## 2013-08-03 VITALS — BP 110/70 | HR 60 | Resp 16 | Ht 63.5 in | Wt 153.8 lb

## 2013-08-03 DIAGNOSIS — I10 Essential (primary) hypertension: Secondary | ICD-10-CM | POA: Diagnosis not present

## 2013-08-03 DIAGNOSIS — M899 Disorder of bone, unspecified: Secondary | ICD-10-CM | POA: Diagnosis not present

## 2013-08-03 DIAGNOSIS — D509 Iron deficiency anemia, unspecified: Secondary | ICD-10-CM | POA: Diagnosis not present

## 2013-08-03 DIAGNOSIS — S82891A Other fracture of right lower leg, initial encounter for closed fracture: Secondary | ICD-10-CM

## 2013-08-03 DIAGNOSIS — J301 Allergic rhinitis due to pollen: Secondary | ICD-10-CM

## 2013-08-03 DIAGNOSIS — E785 Hyperlipidemia, unspecified: Secondary | ICD-10-CM

## 2013-08-03 DIAGNOSIS — S82899A Other fracture of unspecified lower leg, initial encounter for closed fracture: Secondary | ICD-10-CM | POA: Diagnosis not present

## 2013-08-03 DIAGNOSIS — M949 Disorder of cartilage, unspecified: Secondary | ICD-10-CM | POA: Diagnosis not present

## 2013-08-03 DIAGNOSIS — D649 Anemia, unspecified: Secondary | ICD-10-CM | POA: Diagnosis not present

## 2013-08-03 LAB — CBC WITH DIFFERENTIAL/PLATELET
Basophils Absolute: 0 10*3/uL (ref 0.0–0.1)
Basophils Relative: 0 % (ref 0–1)
Eosinophils Absolute: 0.1 10*3/uL (ref 0.0–0.7)
Eosinophils Relative: 2 % (ref 0–5)
HEMATOCRIT: 37.6 % (ref 36.0–46.0)
Hemoglobin: 13 g/dL (ref 12.0–15.0)
LYMPHS ABS: 1.5 10*3/uL (ref 0.7–4.0)
LYMPHS PCT: 23 % (ref 12–46)
MCH: 28.4 pg (ref 26.0–34.0)
MCHC: 34.6 g/dL (ref 30.0–36.0)
MCV: 82.3 fL (ref 78.0–100.0)
Monocytes Absolute: 0.4 10*3/uL (ref 0.1–1.0)
Monocytes Relative: 7 % (ref 3–12)
NEUTROS ABS: 4.4 10*3/uL (ref 1.7–7.7)
Neutrophils Relative %: 68 % (ref 43–77)
Platelets: 210 10*3/uL (ref 150–400)
RBC: 4.57 MIL/uL (ref 3.87–5.11)
RDW: 15.1 % (ref 11.5–15.5)
WBC: 6.4 10*3/uL (ref 4.0–10.5)

## 2013-08-03 LAB — BASIC METABOLIC PANEL WITH GFR
BUN: 26 mg/dL — ABNORMAL HIGH (ref 6–23)
CO2: 30 mEq/L (ref 19–32)
Calcium: 9.3 mg/dL (ref 8.4–10.5)
Chloride: 101 mEq/L (ref 96–112)
Creat: 1.27 mg/dL — ABNORMAL HIGH (ref 0.50–1.10)
GFR, EST AFRICAN AMERICAN: 45 mL/min — AB
GFR, EST NON AFRICAN AMERICAN: 39 mL/min — AB
Glucose, Bld: 83 mg/dL (ref 70–99)
POTASSIUM: 4.1 meq/L (ref 3.5–5.3)
SODIUM: 140 meq/L (ref 135–145)

## 2013-08-03 LAB — FERRITIN: FERRITIN: 61 ng/mL (ref 10–291)

## 2013-08-03 LAB — IRON: Iron: 64 ug/dL (ref 42–145)

## 2013-08-03 MED ORDER — ALENDRONATE SODIUM 70 MG PO TABS: 70.0000 mg | ORAL_TABLET | ORAL | Status: DC

## 2013-08-03 NOTE — Progress Notes (Signed)
   Subjective:    Patient ID: [name], female    DOB: August 30, 1928, 80 y.o.   MRN: [id number]  HPI The PT is here for follow up and re-evaluation of chronic medical conditions, medication management and review of any available recent lab and radiology data.  Preventive health is updated, specifically  Cancer screening and Immunization.   Recently sustained an fracture ti her right ankle by accidental fall, recovering slowly, had surgical intervention, and is still  under care of ortho in [place] currently The PT denies any adverse reactions to current medications since the last visit.      Review of Systems See HPI Denies recent fever or chills. Denies sinus pressure, nasal congestion, ear pain or sore throat. Denies chest congestion, productive cough or wheezing. Denies chest pains, palpitations and leg swelling Denies abdominal pain, nausea, vomiting,diarrhea or constipation.   Denies dysuria, frequency, hesitancy or incontinence.  Denies headaches, seizures, numbness, or tingling. Denies depression, uncontrolled  anxiety or insomnia. Denies skin break down or rash.        Objective:   Physical Exam BP 110/70  Pulse 60  Resp 16  Ht 5' 3.5" (1.613 m)  Wt 153 lb 12.8 oz (69.763 kg)  BMI 26.81 kg/m2  SpO2 96% Patient alert and oriented and in no cardiopulmonary distress.  HEENT: No facial asymmetry, EOMI,   oropharynx pink and moist.  Neck supple no JVD, no mass.  Chest: Clear to auscultation bilaterally.  CVS: S1, S2 no murmurs, no S3.  ABD: Soft non tender.   Ext: No edema  MS: Adequate ROM spine, shoulders, left  hip and knee.Decreased ROM right knee and ankle due to temporary immobilization to facilitate healing of the ankle fracture  Skin: Intact, no ulcerations or rash noted.  Psych: Good eye contact, normal affect. Memory intact not anxious or depressed appearing.  CNS: CN 2-12 intact, power,  normal throughout.no focal deficits noted.          Assessment & Plan:  ESSENTIAL HYPERTENSION Over corrected and symptomatic, d/c HCTZ , re eval in 2 month  Fracture of right ankle Recent fracture healing well, but pt still has limitation in movement, she is to start bisphosphonate  Hyperlipidemia Updated lab needed at/ before next visit. Hyperlipidemia:Low fat diet discussed and encouraged.  uncontrolled when last checked  ALLERGIC RHINITIS, SEASONAL Controlled, no change in medication   Anemia, iron deficiency Updated lab data needed  Hyperlipidemia LDL goal <100 Hyperlipidemia:Low fat diet discussed and encouraged.  Updated lab past due

## 2013-08-03 NOTE — Patient Instructions (Addendum)
Annual physical exam in 2.month, call if you need me before  STOP hCTZ, only azor and bystolic for blood pressure and heart  Start calcium and vit D every day for bones this is over the counter  New is once weekly medication for bones by prescription since you have had a fracture  CBC, iron and ferritin and chem 7 and EGFR  and vit D today

## 2013-08-04 LAB — VITAMIN D 25 HYDROXY (VIT D DEFICIENCY, FRACTURES): VIT D 25 HYDROXY: 17 ng/mL — AB (ref 30–89)

## 2013-08-05 MED ORDER — VITAMIN D (ERGOCALCIFEROL) 1.25 MG (50000 UNIT) PO CAPS: 50000.0000 [IU] | ORAL_CAPSULE | ORAL | Status: DC

## 2013-08-10 ENCOUNTER — Telehealth: Payer: Self-pay | Admitting: Family Medicine

## 2013-08-10 NOTE — Telephone Encounter (Signed)
pls document symptoms having ensure taking medication is being taken  as directed. If it is the fosamax which i recommended because she has had a fracture, (has osteopenia, not osteoporosis) let here know OK to hold off at this time and will discuss further at next visit, If anoither med is what she is talking about or any concerns let me know pls

## 2013-08-11 NOTE — Telephone Encounter (Signed)
The calcium 1200mg  OTC making her sick. Took it one day and felt nauseated, Took it the neck day and it upset her stomach and she got so shakey she had to take a nerve pill and it make her itch around her neck. Not itching anymore but advised that if she started swelling around her lips or tongue to go to Ed but not to take anymore of the calcium until she heard from [place]. Please advise

## 2013-08-14 NOTE — Telephone Encounter (Signed)
IU spoke directly with the pt, she will eat greek yogurtt and drink 80 yo 8 ounces milk daily in place of calcium tablet

## 2013-08-14 NOTE — Telephone Encounter (Signed)
pls advise her either to try smaller pdose of calcium at one time like 500mg  , twice daily, or just ensure she eats food rich in calcium, eg yogurt,  Milh, sring cheese, and sme foods are calcium fortified like orange juice,  To make total daily intake as near to 1000mg  as possible

## 2013-08-24 DIAGNOSIS — S8263XA Displaced fracture of lateral malleolus of unspecified fibula, initial encounter for closed fracture: Secondary | ICD-10-CM | POA: Diagnosis not present

## 2013-08-24 DIAGNOSIS — I10 Essential (primary) hypertension: Secondary | ICD-10-CM | POA: Diagnosis not present

## 2013-09-01 ENCOUNTER — Other Ambulatory Visit: Payer: Self-pay | Admitting: Family Medicine

## 2013-09-13 ENCOUNTER — Other Ambulatory Visit: Payer: Self-pay | Admitting: Family Medicine

## 2013-10-13 ENCOUNTER — Other Ambulatory Visit (HOSPITAL_COMMUNITY)
Admission: RE | Admit: 2013-10-13 | Discharge: 2013-10-13 | Disposition: A | Discharge status: Home / Self Care | Payer: Medicare Other | Source: Ambulatory Visit | Attending: Family Medicine | Admitting: Family Medicine

## 2013-10-13 ENCOUNTER — Encounter (INDEPENDENT_AMBULATORY_CARE_PROVIDER_SITE_OTHER): Payer: Self-pay

## 2013-10-13 ENCOUNTER — Ambulatory Visit (INDEPENDENT_AMBULATORY_CARE_PROVIDER_SITE_OTHER): Payer: Medicare Other | Admitting: Family Medicine

## 2013-10-13 ENCOUNTER — Encounter: Payer: Self-pay | Admitting: Family Medicine

## 2013-10-13 VITALS — BP 130/74 | HR 55 | Resp 16 | Wt 157.4 lb

## 2013-10-13 DIAGNOSIS — Z124 Encounter for screening for malignant neoplasm of cervix: Secondary | ICD-10-CM | POA: Insufficient documentation

## 2013-10-13 DIAGNOSIS — Z1239 Encounter for other screening for malignant neoplasm of breast: Secondary | ICD-10-CM

## 2013-10-13 DIAGNOSIS — I10 Essential (primary) hypertension: Secondary | ICD-10-CM

## 2013-10-13 DIAGNOSIS — Z0000 Encounter for general adult medical examination without abnormal findings: Secondary | ICD-10-CM

## 2013-10-13 DIAGNOSIS — Z1211 Encounter for screening for malignant neoplasm of colon: Secondary | ICD-10-CM

## 2013-10-13 DIAGNOSIS — M899 Disorder of bone, unspecified: Secondary | ICD-10-CM

## 2013-10-13 DIAGNOSIS — E785 Hyperlipidemia, unspecified: Secondary | ICD-10-CM

## 2013-10-13 DIAGNOSIS — M949 Disorder of cartilage, unspecified: Secondary | ICD-10-CM

## 2013-10-13 LAB — HEMOCCULT GUIAC POC 1CARD (OFFICE): FECAL OCCULT BLD: NEGATIVE

## 2013-10-13 NOTE — Patient Instructions (Addendum)
F/u in end October, call if you need me before  Fasting lipid, cmp, TSH  , vit D in October berfore visit  No changes in medication

## 2013-10-15 NOTE — Progress Notes (Signed)
   Subjective:    Patient ID: [name], female    DOB: 22-Jul-1928, 80 y.o.   MRN: [id number]  HPI Pt in for pelvic and breast exam No other concerns voiced or addressed   Review of Systems See HPI     Objective:   Physical Exam  BP 130/74  Pulse 55  Resp 16  Wt 157 lb 6.4 oz (71.396 kg)  SpO2 100% Pleasant well nourished female, alert and oriented x 3, in no cardio-pulmonary distress. Afebrile.  Breast: No asymetry,no masses. No nipple discharge or inversion. No axillary or supraclavicular adenopathy    Rectal:  No mass. Guaiac negative stool.  GU: External genitalia normal. No lesions.Reduced genital hair Vaginal canal mucosa pale and atrophic, mild bleeding on contact..Physiologic  discharge. Uterus decreaed /atrophic in size, no adnexal masses, no cervical motion or adnexal tenderness.       Assessment & Plan:  Encounter for annual physical exam Pelvic and breast exam as documented. Exam  Normal. Pap sent

## 2013-10-16 ENCOUNTER — Encounter: Payer: Self-pay | Admitting: Family Medicine

## 2013-10-16 DIAGNOSIS — Z0000 Encounter for general adult medical examination without abnormal findings: Secondary | ICD-10-CM | POA: Insufficient documentation

## 2013-10-16 NOTE — Assessment & Plan Note (Addendum)
Pelvic and breast exam as documented. Exam  Normal. Pap sent

## 2013-10-17 LAB — CYTOLOGY - PAP

## 2013-10-28 ENCOUNTER — Other Ambulatory Visit: Payer: Self-pay

## 2013-10-28 MED ORDER — CLONAZEPAM 0.5 MG PO TABS: 0.5000 mg | ORAL_TABLET | Freq: Two times a day (BID) | ORAL | Status: DC

## 2013-11-03 ENCOUNTER — Telehealth: Payer: Self-pay | Admitting: Family Medicine

## 2013-11-03 DIAGNOSIS — R7301 Impaired fasting glucose: Secondary | ICD-10-CM

## 2013-11-03 DIAGNOSIS — E785 Hyperlipidemia, unspecified: Secondary | ICD-10-CM

## 2013-11-03 DIAGNOSIS — I10 Essential (primary) hypertension: Secondary | ICD-10-CM

## 2013-11-03 DIAGNOSIS — D509 Iron deficiency anemia, unspecified: Secondary | ICD-10-CM | POA: Insufficient documentation

## 2013-11-03 NOTE — Assessment & Plan Note (Signed)
Hyperlipidemia:Low fat diet discussed and encouraged.  Updated lab past due

## 2013-11-03 NOTE — Addendum Note (Signed)
Addended by: Eual Fines on: 11/03/2013 09:06 AM   Modules accepted: Orders

## 2013-11-03 NOTE — Assessment & Plan Note (Signed)
Updated lab needed at/ before next visit. Hyperlipidemia:Low fat diet discussed and encouraged.  uncontrolled when last checked

## 2013-11-03 NOTE — Assessment & Plan Note (Signed)
Updated lab data needed 

## 2013-11-03 NOTE — Telephone Encounter (Signed)
pls contact pt, NEEDS cmp and EGFR  and fasting lipid, within the next week if possible. She is on  on cholesterol med (azor) and no lipid panel  since 2014, and hepatic even ;longer than that! Advise her to drink water so not "dehydrated" most recent lab had abn renal function so want to re  eval Thank you

## 2013-11-03 NOTE — Assessment & Plan Note (Signed)
Controlled, no change in medication  

## 2013-11-03 NOTE — Assessment & Plan Note (Signed)
Over corrected and symptomatic, d/c HCTZ , re eval in 2 month

## 2013-11-03 NOTE — Telephone Encounter (Signed)
Pt aware and will fax order to lab to be done within the next week or two

## 2013-11-03 NOTE — Assessment & Plan Note (Signed)
Recent fracture healing well, but pt still has limitation in movement, she is to start bisphosphonate

## 2013-11-04 DIAGNOSIS — E785 Hyperlipidemia, unspecified: Secondary | ICD-10-CM | POA: Diagnosis not present

## 2013-11-04 DIAGNOSIS — I10 Essential (primary) hypertension: Secondary | ICD-10-CM | POA: Diagnosis not present

## 2013-11-04 LAB — LIPID PANEL
CHOLESTEROL: 188 mg/dL (ref 0–200)
HDL: 74 mg/dL (ref >39)
LDL CALC: 97 mg/dL (ref 0–99)
Total CHOL/HDL Ratio: 2.5 Ratio
Triglycerides: 87 mg/dL (ref <150)
VLDL: 17 mg/dL (ref 0–40)

## 2013-11-04 LAB — COMPLETE METABOLIC PANEL WITH GFR
ALT: 14 U/L (ref 0–35)
AST: 14 U/L (ref 0–37)
Albumin: 4.4 g/dL (ref 3.5–5.2)
Alkaline Phosphatase: 85 U/L (ref 39–117)
BUN: 17 mg/dL (ref 6–23)
CO2: 24 mEq/L (ref 19–32)
Calcium: 9.2 mg/dL (ref 8.4–10.5)
Chloride: 103 mEq/L (ref 96–112)
Creat: 1.01 mg/dL (ref 0.50–1.10)
GFR, Est African American: 60 mL/min
GFR, Est Non African American: 52 mL/min — ABNORMAL LOW
Glucose, Bld: 135 mg/dL — ABNORMAL HIGH (ref 70–99)
Potassium: 4.2 mEq/L (ref 3.5–5.3)
Sodium: 139 mEq/L (ref 135–145)
Total Bilirubin: 1.2 mg/dL (ref 0.2–1.2)
Total Protein: 7.3 g/dL (ref 6.0–8.3)

## 2013-11-07 NOTE — Addendum Note (Signed)
Addended by: Denman George B on: 11/07/2013 03:36 PM   Modules accepted: Orders

## 2013-11-09 DIAGNOSIS — R7301 Impaired fasting glucose: Secondary | ICD-10-CM | POA: Diagnosis not present

## 2013-11-09 LAB — HEMOGLOBIN A1C
HEMOGLOBIN A1C: 6 % — AB (ref <5.7)
Mean Plasma Glucose: 126 mg/dL — ABNORMAL HIGH (ref <117)

## 2013-11-14 ENCOUNTER — Telehealth: Payer: Self-pay | Admitting: *Deleted

## 2013-11-14 DIAGNOSIS — R7301 Impaired fasting glucose: Secondary | ICD-10-CM

## 2013-11-14 NOTE — Addendum Note (Signed)
Addended by: Denman George B on: 11/14/2013 04:56 PM   Modules accepted: Orders

## 2013-11-14 NOTE — Telephone Encounter (Signed)
Pt called wanting to know about her blood sugar results from her labs. Please advise [phone]

## 2013-11-14 NOTE — Telephone Encounter (Signed)
Patient aware of lab results.  Needs to have a1c in 4 months.  Lab order mailed to home.

## 2013-11-18 ENCOUNTER — Other Ambulatory Visit: Payer: Self-pay

## 2013-11-18 MED ORDER — PANTOPRAZOLE SODIUM 20 MG PO TBEC: DELAYED_RELEASE_TABLET | ORAL | Status: DC

## 2013-11-23 ENCOUNTER — Other Ambulatory Visit: Payer: Self-pay | Admitting: Family Medicine

## 2013-12-19 ENCOUNTER — Telehealth: Payer: Self-pay | Admitting: Pulmonary Disease

## 2013-12-19 NOTE — Telephone Encounter (Signed)
Error.  Wrong patient.  [name]

## 2014-01-08 ENCOUNTER — Other Ambulatory Visit: Payer: Self-pay | Admitting: Family Medicine

## 2014-01-20 ENCOUNTER — Telehealth: Payer: Self-pay | Admitting: Family Medicine

## 2014-01-20 NOTE — Telephone Encounter (Signed)
Please send to [facility]

## 2014-01-23 NOTE — Telephone Encounter (Signed)
This med is not noted on med list.  Attempted to call patient to verify but no ability to leave message.   Medicine is for control of cholesterol but is usually in powder form.

## 2014-01-31 DIAGNOSIS — Z23 Encounter for immunization: Secondary | ICD-10-CM | POA: Diagnosis not present

## 2014-02-07 ENCOUNTER — Other Ambulatory Visit: Payer: Self-pay

## 2014-02-07 ENCOUNTER — Telehealth: Payer: Self-pay | Admitting: *Deleted

## 2014-02-07 MED ORDER — CHOLESTYRAMINE LIGHT 4 GM/DOSE PO POWD: ORAL | Status: DC

## 2014-02-07 NOTE — Telephone Encounter (Signed)
pls  Refill x 6, ask pharmacy to fax over original /last fill if unsure how to order, let her know also pls

## 2014-02-07 NOTE — Telephone Encounter (Signed)
Pt called stating she wants her RX called into [facility] in [place]. [id number] or [phone]

## 2014-02-07 NOTE — Telephone Encounter (Signed)
Wants to know if she can have a prescription refill for Prevalite. (on historical medlist) She uses 1 scoop a day and it helps to keep her stool formed. Has been taking and she recently ran out. Please advise

## 2014-02-07 NOTE — Telephone Encounter (Signed)
Patient aware.

## 2014-02-07 NOTE — Telephone Encounter (Signed)
Pt called and LMOM she would like something called in for [facility] to [facility]. Please advise

## 2014-02-09 DIAGNOSIS — C259 Malignant neoplasm of pancreas, unspecified: Secondary | ICD-10-CM

## 2014-02-09 HISTORY — DX: Malignant neoplasm of pancreas, unspecified: C25.9

## 2014-02-14 ENCOUNTER — Other Ambulatory Visit: Payer: Self-pay | Admitting: Family Medicine

## 2014-02-14 DIAGNOSIS — I10 Essential (primary) hypertension: Secondary | ICD-10-CM | POA: Diagnosis not present

## 2014-02-14 DIAGNOSIS — R7989 Other specified abnormal findings of blood chemistry: Secondary | ICD-10-CM | POA: Diagnosis not present

## 2014-02-14 DIAGNOSIS — E785 Hyperlipidemia, unspecified: Secondary | ICD-10-CM | POA: Diagnosis not present

## 2014-02-14 LAB — COMPREHENSIVE METABOLIC PANEL
ALT: 70 U/L — AB (ref 0–35)
AST: 47 U/L — ABNORMAL HIGH (ref 0–37)
Albumin: 4.1 g/dL (ref 3.5–5.2)
Alkaline Phosphatase: 127 U/L — ABNORMAL HIGH (ref 39–117)
BUN: 24 mg/dL — AB (ref 6–23)
CHLORIDE: 104 meq/L (ref 96–112)
CO2: 28 meq/L (ref 19–32)
CREATININE: 0.97 mg/dL (ref 0.50–1.10)
Calcium: 9 mg/dL (ref 8.4–10.5)
Glucose, Bld: 106 mg/dL — ABNORMAL HIGH (ref 70–99)
Potassium: 4.7 mEq/L (ref 3.5–5.3)
Sodium: 139 mEq/L (ref 135–145)
Total Bilirubin: 0.9 mg/dL (ref 0.2–1.2)
Total Protein: 7.1 g/dL (ref 6.0–8.3)

## 2014-02-14 LAB — LIPID PANEL
CHOLESTEROL: 161 mg/dL (ref 0–200)
HDL: 60 mg/dL (ref >39)
LDL Cholesterol: 81 mg/dL (ref 0–99)
TRIGLYCERIDES: 99 mg/dL (ref <150)
Total CHOL/HDL Ratio: 2.7 Ratio
VLDL: 20 mg/dL (ref 0–40)

## 2014-02-14 LAB — TSH: TSH: 1.458 u[IU]/mL (ref 0.350–4.500)

## 2014-02-15 LAB — VITAMIN D 25 HYDROXY (VIT D DEFICIENCY, FRACTURES): Vit D, 25-Hydroxy: 26 ng/mL — ABNORMAL LOW (ref 30–89)

## 2014-02-15 LAB — HEPATITIS PANEL, ACUTE
HCV Ab: NEGATIVE
HEP B S AG: NEGATIVE
Hep A IgM: NONREACTIVE
Hep B C IgM: NONREACTIVE

## 2014-02-16 ENCOUNTER — Encounter: Payer: Self-pay | Admitting: Family Medicine

## 2014-02-16 ENCOUNTER — Telehealth: Payer: Self-pay | Admitting: Family Medicine

## 2014-02-16 ENCOUNTER — Encounter: Payer: Self-pay | Admitting: Internal Medicine

## 2014-02-16 ENCOUNTER — Encounter (INDEPENDENT_AMBULATORY_CARE_PROVIDER_SITE_OTHER): Payer: Self-pay

## 2014-02-16 ENCOUNTER — Ambulatory Visit (INDEPENDENT_AMBULATORY_CARE_PROVIDER_SITE_OTHER): Payer: Medicare Other | Admitting: Family Medicine

## 2014-02-16 VITALS — BP 142/76 | HR 56 | Resp 16 | Ht 63.5 in | Wt 156.0 lb

## 2014-02-16 DIAGNOSIS — M549 Dorsalgia, unspecified: Secondary | ICD-10-CM | POA: Insufficient documentation

## 2014-02-16 DIAGNOSIS — I10 Essential (primary) hypertension: Secondary | ICD-10-CM

## 2014-02-16 DIAGNOSIS — J301 Allergic rhinitis due to pollen: Secondary | ICD-10-CM

## 2014-02-16 DIAGNOSIS — R7989 Other specified abnormal findings of blood chemistry: Secondary | ICD-10-CM | POA: Insufficient documentation

## 2014-02-16 DIAGNOSIS — R1011 Right upper quadrant pain: Secondary | ICD-10-CM | POA: Diagnosis not present

## 2014-02-16 DIAGNOSIS — R194 Change in bowel habit: Secondary | ICD-10-CM

## 2014-02-16 DIAGNOSIS — F419 Anxiety disorder, unspecified: Secondary | ICD-10-CM

## 2014-02-16 DIAGNOSIS — K219 Gastro-esophageal reflux disease without esophagitis: Secondary | ICD-10-CM

## 2014-02-16 DIAGNOSIS — R945 Abnormal results of liver function studies: Secondary | ICD-10-CM

## 2014-02-16 DIAGNOSIS — M150 Primary generalized (osteo)arthritis: Secondary | ICD-10-CM

## 2014-02-16 DIAGNOSIS — R7302 Impaired glucose tolerance (oral): Secondary | ICD-10-CM

## 2014-02-16 DIAGNOSIS — M5489 Other dorsalgia: Secondary | ICD-10-CM | POA: Insufficient documentation

## 2014-02-16 MED ORDER — DICLOFENAC SODIUM 1 % TD GEL: TRANSDERMAL | Status: DC

## 2014-02-16 NOTE — Patient Instructions (Signed)
F/u in early December, call if you need me before  STOP tylenol for arthritis pain  Your liver enzymes are high  You are referred for [place] of liver  You are referred to dr Gala Romney due to change in stool as well as elevated liver enzymes  New is Voltaren gel, rub on lower back daily, as needed, for pain   Hepatic panel and HBa1C and CBc nov 18 please

## 2014-02-17 NOTE — Telephone Encounter (Signed)
She is having her liuver checked further and has been  Referred to Dr Gala Romney also since c/o change in [facility]

## 2014-02-22 ENCOUNTER — Ambulatory Visit (HOSPITAL_COMMUNITY)
Admission: RE | Admit: 2014-02-22 | Discharge: 2014-02-22 | Disposition: A | Discharge status: Home / Self Care | Payer: Medicare Other | Source: Ambulatory Visit | Attending: Family Medicine | Admitting: Family Medicine

## 2014-02-22 DIAGNOSIS — R7989 Other specified abnormal findings of blood chemistry: Secondary | ICD-10-CM | POA: Diagnosis not present

## 2014-02-22 DIAGNOSIS — R194 Change in bowel habit: Secondary | ICD-10-CM | POA: Insufficient documentation

## 2014-02-22 DIAGNOSIS — R1011 Right upper quadrant pain: Secondary | ICD-10-CM | POA: Insufficient documentation

## 2014-02-22 DIAGNOSIS — R945 Abnormal results of liver function studies: Secondary | ICD-10-CM

## 2014-02-27 ENCOUNTER — Encounter: Payer: Self-pay | Admitting: Gastroenterology

## 2014-02-27 ENCOUNTER — Ambulatory Visit (INDEPENDENT_AMBULATORY_CARE_PROVIDER_SITE_OTHER): Payer: Medicare Other | Admitting: Gastroenterology

## 2014-02-27 VITALS — BP 128/72 | HR 54 | Temp 97.4°F | Resp 18 | Ht 63.5 in | Wt 156.0 lb

## 2014-02-27 DIAGNOSIS — R7989 Other specified abnormal findings of blood chemistry: Secondary | ICD-10-CM

## 2014-02-27 DIAGNOSIS — R945 Abnormal results of liver function studies: Secondary | ICD-10-CM

## 2014-02-27 DIAGNOSIS — R932 Abnormal findings on diagnostic imaging of liver and biliary tract: Secondary | ICD-10-CM | POA: Diagnosis not present

## 2014-02-27 NOTE — Patient Instructions (Signed)
1. MRI liver as discussed. We will call you with results.

## 2014-02-27 NOTE — Assessment & Plan Note (Signed)
80 year old lady with recent abnormal LFTs incidentally noted on routine labs. Abdominal ultrasound showed to target-like liver lesions which need to be further evaluated. She also had mild intrahepatic and extrahepatic biliary dilation. Only comparison study dates back to a CT in 2007. No liver lesions noted at that time but she did have some biliary dilation. At this point she needs further imaging in the way of MRI of liver hepatic protocol with and without contrast. This is been scheduled. Further recommendations to follow.

## 2014-02-27 NOTE — Progress Notes (Signed)
Primary Care Physician: [name], MD  Primary Gastroenterologist:  Garfield Cornea, MD   Chief Complaint  Patient presents with  . abnormal lfts, change in stools    HPI: [name] is a 80 y.o. female here for further evaluation of abnormal LFTs and abnormal liver on ultrasound. We last saw the patient in April 2013. She has a history of chronic GERD, chronic diarrhea. Recently underwent routine labs and noted to have change in LFTs. Previously normal and now mildly elevated. She had negative hepatitis B and C. markers. Normal iron and ferritin earlier in the summer. Abdominal ultrasound was done which showed 10 mm common bile duct and mild intrahepatic biliary dilation, to target-like lesions in the liver, one in the right hepatic lobe and one near the gallbladder fossa. MRI was recommended.  Denies any abdominal pain, n/v, weight loss. Chronic diarrhea managed with Questran. BM 2 per day. No nocturnal BMs. No melena, brbpr. No heartburn, dysphagia. No cholesterol medications. Recently stopped Tylenol, two before bed as needed. Stopped Aleve to, was taking one BID. No herbal medications.   Only complaints of fatigue, and wakes up with back pain. Back pain improves with mobility.    CT of the abdomen/pelvis with contrast back in 2007 showed old granulomatous disease in the liver and spleen. Mild intrahepatic biliary ductal dilation the left hepatic lobe. Common bile duct measures 9 mm at that time.  Current Outpatient Prescriptions  Medication Sig Dispense Refill  . alendronate (FOSAMAX) 70 MG tablet Take 1 tablet (70 mg total) by mouth every 7 (seven) days. Take with a full glass of water on an empty stomach.  4 tablet  11  . aspirin 81 MG EC tablet Take 81 mg by mouth daily.        . AZOR 10-40 MG per tablet TAKE 1 TABLET DAILY  90 tablet  0  . BYSTOLIC 10 MG tablet TAKE 1 TABLET DAILY  90 tablet  0  . cetirizine (ZYRTEC) 10 MG tablet TAKE 1 TABLET BY MOUTH EVERY DAY   90 tablet  1  . cholestyramine light (PREVALITE) 4 GM/DOSE powder 1 scoop daily  239.4 g  5  . clonazePAM (KLONOPIN) 0.5 MG tablet Take 1 tablet (0.5 mg total) by mouth 2 (two) times daily.  180 tablet  1  . cycloSPORINE (RESTASIS) 0.05 % ophthalmic emulsion Place 1 drop into both eyes 2 (two) times daily.      . diclofenac sodium (VOLTAREN) 1 % GEL Apply  daily, as needed, to lower back, for uncontrolled pain  100 g  0  . pantoprazole (PROTONIX) 20 MG tablet TAKE 1 TABLET EVERY DAY  90 tablet  1  . Probiotic Product (HEALTHY COLON PO) Take 1 tablet by mouth daily.       No current facility-administered medications for this visit.    Allergies as of 02/27/2014 - Review Complete 02/27/2014  Allergen Reaction Noted  . Aciphex [rabeprazole sodium] Other (See Comments) 09/03/2010  . Amlodipine besylate-valsartan  01/07/2010  . Esomeprazole magnesium  10/02/2010  . Omeprazole Other (See Comments) 10/02/2010  . Penicillins Other (See Comments) 02/17/2007  . Ciprofloxacin Rash 08/18/2011   Past Medical History  Diagnosis Date  . Hypertension 20 years   . Chronic diarrhea   . Anxiety   . Depression   . Kidney stone     hx/ crushed   . Hx of adenomatous colonic polyps     tubular adenomas, last found in 2008  .  GERD (gastroesophageal reflux disease)   . Erosive esophagitis   . Schatzki's ring 08/26/10    Last dilated on EGD by Dr. Trevor [facility], linear gastric erosions, BI hemigastrectomy  . Hyperplastic colon polyp 03/19/10    tcs by Dr. Gala Romney   Past Surgical History  Procedure Laterality Date  . Stomach ulcer  50 years ago     had some of her stomach removed   . Bunion removal      from both feet   . Left shoulder surgery  2009    DR HARRISON  . Colonoscopy  03/19/2010    DR Gala Romney,, normal TI, pancolonic diverticula, random colon bx neg., hyperplastic polyps removed  . Esophagogastroduodenoscopy  11/22/2003    DR Gala Romney, erosive RE, Billroth I  . Esophagogastroduodenoscopy   08/26/10    Dr. Gala Romney- moderate severe ERE, Scahtzki ring s/p dilation, Billroth I, linear gastric erosions, bx-gastric xanthelasma  . Breast lumpectomy Left   . Orif ankle fracture Right 05/22/2013    Procedure: OPEN REDUCTION INTERNAL FIXATION (ORIF) RIGHT ANKLE FRACTURE;  Surgeon: Sanjuana Kava, MD;  Location: [facility];  Service: Orthopedics;  Laterality: Right;  . Cholecystectomy  1965    Family History  Problem Relation Age of Onset  . Hypertension Mother   . Kidney failure Brother     X1 ON DIALYSIS  . Diabetes Sister   . Cancer Sister     X2  . Hypertension Father   . Liver disease Neg Hx   . Colon cancer Neg Hx    History   Social History  . Marital Status: Widowed    Spouse Name: N/A    Number of Children: 2  . Years of Education: N/A   Occupational History  . retired from Teacher, adult education    Social History Main Topics  . Smoking status: Former Smoker    Types: Cigarettes    Quit date: 11/09/2011  . Smokeless tobacco: Never Used     Comment: quit a few weeks ago   . Alcohol Use: No  . Drug Use: No  . Sexual Activity: No   Other Topics Concern  . None   Social History Narrative  . None    ROS:  General: Negative for anorexia, weight loss, fever, chills. See history of present illness. ENT: Negative for hoarseness, difficulty swallowing , nasal congestion. CV: Negative for chest pain, angina, palpitations, dyspnea on exertion, peripheral edema.  Respiratory: Negative for dyspnea at rest, dyspnea on exertion, cough, sputum, wheezing.  GI: See history of present illness. GU:  Negative for dysuria, hematuria, urinary incontinence, urinary frequency, nocturnal urination.  Endo: Negative for unusual weight change.    Physical Examination:   BP 128/72  Pulse 54  Temp(Src) 97.4 F (36.3 C) (Oral)  Resp 18  Ht 5' 3.5" (1.613 m)  Wt 156 lb (70.761 kg)  BMI 27.20 kg/m2  General: Well-nourished, well-developed in no acute distress.  Eyes: No icterus. Mouth:  Oropharyngeal mucosa moist and pink , no lesions erythema or exudate. Lungs: Clear to auscultation bilaterally.  Heart: Regular rate and rhythm, no murmurs rubs or gallops.  Abdomen: Bowel sounds are normal, nontender, nondistended, no hepatosplenomegaly or masses, no abdominal bruits or hernia , no rebound or guarding.   Extremities: No lower extremity edema. No clubbing or deformities. Neuro: Alert and oriented x 4   Skin: Warm and dry, no jaundice.   Psych: Alert and cooperative, normal mood and affect.  Labs:  Lab Results  Component Value Date  HGBA1C 6.0* 11/09/2013   Lab Results  Component Value Date   WBC 6.4 08/03/2013   HGB 13.0 08/03/2013   HCT 37.6 08/03/2013   MCV 82.3 08/03/2013   PLT 210 08/03/2013   Lab Results  Component Value Date   CREATININE 0.97 02/14/2014   BUN 24* 02/14/2014   NA 139 02/14/2014   K 4.7 02/14/2014   CL 104 02/14/2014   CO2 28 02/14/2014    Component     Latest Ref Rng 02/03/2012  Calcium     8.4 - 10.5 mg/dL 9.6  Total Bilirubin     0.2 - 1.2 mg/dL 1.2  Alkaline Phosphatase     39 - 117 U/L 88  AST     0 - 37 U/L 16  ALT     0 - 35 U/L 13  Total Protein     6.0 - 8.3 g/dL 7.0  Albumin     3.5 - 5.2 g/dL 4.3   Component     Latest Ref Rng 11/04/2013 02/14/2014  Calcium     8.4 - 10.5 mg/dL 9.2 9.0  Total Bilirubin     0.2 - 1.2 mg/dL 1.2 0.9  Alkaline Phosphatase     39 - 117 U/L 85 127 (H)  AST     0 - 37 U/L 14 47 (H)  ALT     0 - 35 U/L 14 70 (H)  Total Protein     6.0 - 8.3 g/dL 7.3 7.1  Albumin     3.5 - 5.2 g/dL 4.4 4.1    Lab Results  Component Value Date   HEPAIGM NON REACTIVE 02/14/2014   HEPBIGM NON REACTIVE 02/14/2014   HCV Ab negative. Hep B surf Ag: negative.  Lab Results  Component Value Date   IRON 64 08/03/2013   FERRITIN 61 08/03/2013    Imaging Studies: US Abdomen Limited Ruq  27-Feb-2014   CLINICAL DATA:  Elevated liver function tests. Altered stool habits. Right upper quadrant abdominal pain.   EXAM: US ABDOMEN LIMITED - RIGHT UPPER QUADRANT  COMPARISON:  None.  FINDINGS: Gallbladder:  Absent  Common bile duct:  Diameter: 10 mm.  Liver:  Intrahepatic biliary dilatation, mild. Right hepatic lobe lesion measuring 2.3 by 2.4 by 2.4 cm and has central echogenicity similar to the liver but a 4 mm hypoechoic rim on image 35, without definite enhance through transmission. Similar lesion along the gallbladder fossa, 9 mm in diameter on image 48.  IMPRESSION: 1. Two target-like lesions in the liver, 1 in the right hepatic lobe in 1 near the gallbladder fossa. These are nonspecific and malignancy can have this appearance. Hepatic protocol MRI with and without contrast is recommended for further characterization. 2. Absent gallbladder. 10 mm in diameter common bile duct and mild intrahepatic biliary dilatation may simply be physiologic. These results will be called to the ordering clinician or representative by the Radiologist Assistant, and communication documented in the PACS or zVision Dashboard.   Electronically Signed   By: Sherryl Barters M.D.   On: Feb 27, 2014 10:58

## 2014-02-28 NOTE — Progress Notes (Signed)
cc'ed to pcp °

## 2014-03-02 ENCOUNTER — Encounter (HOSPITAL_COMMUNITY): Payer: Self-pay

## 2014-03-02 ENCOUNTER — Ambulatory Visit (HOSPITAL_COMMUNITY)
Admission: RE | Admit: 2014-03-02 | Discharge: 2014-03-02 | Disposition: A | Discharge status: Home / Self Care | Payer: Medicare Other | Source: Ambulatory Visit | Attending: Gastroenterology | Admitting: Gastroenterology

## 2014-03-02 DIAGNOSIS — C787 Secondary malignant neoplasm of liver and intrahepatic bile duct: Secondary | ICD-10-CM | POA: Diagnosis not present

## 2014-03-02 DIAGNOSIS — R1909 Other intra-abdominal and pelvic swelling, mass and lump: Secondary | ICD-10-CM | POA: Insufficient documentation

## 2014-03-02 DIAGNOSIS — R7989 Other specified abnormal findings of blood chemistry: Secondary | ICD-10-CM | POA: Diagnosis not present

## 2014-03-02 DIAGNOSIS — C801 Malignant (primary) neoplasm, unspecified: Secondary | ICD-10-CM | POA: Diagnosis not present

## 2014-03-02 DIAGNOSIS — R945 Abnormal results of liver function studies: Secondary | ICD-10-CM

## 2014-03-02 DIAGNOSIS — R932 Abnormal findings on diagnostic imaging of liver and biliary tract: Secondary | ICD-10-CM | POA: Diagnosis not present

## 2014-03-02 MED ORDER — SODIUM CHLORIDE 0.9 % IJ SOLN
INTRAMUSCULAR | Status: AC
  Filled 2014-03-02: qty 15

## 2014-03-02 MED ORDER — SODIUM CHLORIDE 0.9 % IV SOLN
INTRAVENOUS | Status: AC
  Filled 2014-03-02: qty 150

## 2014-03-02 MED ORDER — GADOBENATE DIMEGLUMINE 529 MG/ML IV SOLN
14.0000 mL | Freq: Once | INTRAVENOUS | Status: AC | PRN
  Administered 2014-03-02: 14 mL via INTRAVENOUS

## 2014-03-03 ENCOUNTER — Telehealth: Payer: Self-pay

## 2014-03-03 NOTE — Telephone Encounter (Signed)
Pt is calling to see if the results are back from her test. Please advise

## 2014-03-04 NOTE — Assessment & Plan Note (Signed)
Controlled, no change in medication  

## 2014-03-04 NOTE — Progress Notes (Signed)
   Subjective:    Patient ID: [name], female    DOB: 28-Jun-1928, 80 y.o.   MRN: [id number]  HPI The PT is here for follow up and re-evaluation of chronic medical conditions, medication management and review of any available recent lab and radiology data.  Preventive health is updated, specifically  Cancer screening and Immunization.   Questions or concerns regarding consultations or procedures which the PT has had in the interim are  addressed. The PT denies any adverse reactions to current medications since the last visit.  Notes a recent change in BM, with need to change o different supplement to maintain regular BM Notes stool is more watery and less formed also       Review of Systems See HPI Denies recent fever or chills. Denies sinus pressure, nasal congestion, ear pain or sore throat. Denies chest congestion, productive cough or wheezing. Denies chest pains, palpitations and leg swelling Denies abdominal pain, nausea, vomiting,diarrhea or constipation.   Denies dysuria, frequency, hesitancy or incontinence. C/o joint pain, swelling and limitation in mobility. Denies headaches, seizures, numbness, or tingling. Denies depression, anxiety or insomnia. Denies skin break down or rash.        Objective:   Physical Exam BP 142/76  Pulse 56  Resp 16  Ht 5' 3.5" (1.613 m)  Wt 156 lb (70.761 kg)  BMI 27.20 kg/m2  SpO2 96% Patient alert and oriented and in no cardiopulmonary distress.  HEENT: No facial asymmetry, EOMI,   oropharynx pink and moist.  Neck supple no JVD, no mass.  Chest: Clear to auscultation bilaterally.  CVS: S1, S2 no murmurs, no S3.Regular rate.  ABD: Soft non tender.   Ext: No edema  MS: Adequate ROM spine, shoulders, hips and knees.  Skin: Intact, no ulcerations or rash noted.  Psych: Good eye contact, normal affect. Memory intact not anxious or depressed appearing.  CNS: CN 2-12 intact, power,  normal throughout.no focal deficits  noted.        Assessment & Plan:  Essential hypertension Controlled, no change in medication   Elevated LFTs Pt to d/c tylenol. RUQ ultrasound to further eval Dur to c/o recent  change in BM will refer to GI for re eval also  Anxiety Controlled, no change in medication   ALLERGIC RHINITIS, SEASONAL Controlled, no change in medication   GERD (gastroesophageal reflux disease) Controlled, no change in medication   Primary generalized (osteo)arthritis Topical anti inflammatory fror symptom management  Change in stool habits Recent change in stool habits and abn LFT, pt to have [place] of liver and further eval by GI

## 2014-03-04 NOTE — Assessment & Plan Note (Signed)
Pt to d/c tylenol. RUQ ultrasound to further eval Dur to c/o recent  change in BM will refer to GI for re eval also

## 2014-03-04 NOTE — Assessment & Plan Note (Signed)
Recent change in stool habits and abn LFT, pt to have [place] of liver and further eval by GI

## 2014-03-04 NOTE — Assessment & Plan Note (Signed)
Topical anti inflammatory fror symptom management

## 2014-03-06 ENCOUNTER — Telehealth: Payer: Self-pay | Admitting: Internal Medicine

## 2014-03-06 DIAGNOSIS — K8689 Other specified diseases of pancreas: Secondary | ICD-10-CM

## 2014-03-06 NOTE — Telephone Encounter (Signed)
Looks like pt also called last week but message wasI never routed to me.  I am waiting on response from Dr. Gala Romney. Patient likely has a pancreatic cancer. Likely next step referral to oncology.   Please let the patient know I am waiting on input from Dr. Gala Romney and I will be out tomorrow. I will call her Wednesday morning.

## 2014-03-06 NOTE — Telephone Encounter (Signed)
CALLED INQUIRING ABOUT MRI RESULTS

## 2014-03-06 NOTE — Telephone Encounter (Signed)
Routing to LSL for results.  

## 2014-03-06 NOTE — Telephone Encounter (Signed)
Patient called this afternoon to see if we had her results available. She said that she had called earlier this morning and hasn't heard back from [place]. I told her the nurse was waiting to hear back from the provider with her recommendations and she would call her once she does.

## 2014-03-07 ENCOUNTER — Other Ambulatory Visit (HOSPITAL_COMMUNITY): Payer: Self-pay | Admitting: Family Medicine

## 2014-03-07 ENCOUNTER — Encounter: Payer: Self-pay | Admitting: Internal Medicine

## 2014-03-07 ENCOUNTER — Other Ambulatory Visit: Payer: Self-pay

## 2014-03-07 ENCOUNTER — Telehealth: Payer: Self-pay | Admitting: Internal Medicine

## 2014-03-07 ENCOUNTER — Telehealth: Payer: Self-pay

## 2014-03-07 DIAGNOSIS — C259 Malignant neoplasm of pancreas, unspecified: Secondary | ICD-10-CM

## 2014-03-07 DIAGNOSIS — R932 Abnormal findings on diagnostic imaging of liver and biliary tract: Secondary | ICD-10-CM

## 2014-03-07 NOTE — Telephone Encounter (Signed)
Spoke with Barbaraann Rondo, patient's son, who is located in [place]. All questions answered. I asked him to contact us with any further concerns.

## 2014-03-07 NOTE — Telephone Encounter (Signed)
I called the patient and gave her the appt to [facility] on 10/30 at 3:30 and made her aware she needed labs drawn today at [facility].    Patient ask if we would relay this information to her sister(Barbara), because she is having a hard time understanding what we are saying.    She will have her sister call right away.  Routing to Laban Emperor to speak with the patient's sister.

## 2014-03-07 NOTE — Telephone Encounter (Signed)
Patient called the office saying that RMR had just called her about 2 minutes ago and was wanting to speak with him about the message he left. I told her that he was not in the office that he must have called from the hospital. She asked if I would get a message to him to call her back. [phone]

## 2014-03-07 NOTE — Telephone Encounter (Signed)
Actually spoke with Lesia Sago at [phone], patient's sister. Ms. Alcivar' son will be calling today after 3pm to speak with me.

## 2014-03-07 NOTE — Telephone Encounter (Signed)
Talked with Peggy([phone]) at [facility] and she said that the radiology had to review it and he has it now. She said it could take till Wednesday morning before we could know. I told that we needed this done ASAP. She will call me back and let us know as soon as she hear something.

## 2014-03-07 NOTE — Telephone Encounter (Signed)
I will have [name] call and speak with her.

## 2014-03-07 NOTE — Telephone Encounter (Signed)
I have reviewed the MRI results. Unfortunately, it has ominous findings. Patient likely has pancreatic carcinoma with the liver metastasis. I called the patient today and informed her the MRI appears to show a tumor in the pancreas and liver. Given the widespread nature of the findings, I feel it would be best to go ahead and get her an ASAP consultation with oncology and let them decide the best way to approach a tissue diagnosis. It appears that she could have either an ultrasound-guided or EUS FNA biopsy to establish a diagnosis. I will also arrange for her have a serum CA-19-9 drawn.

## 2014-03-07 NOTE — Telephone Encounter (Signed)
Per RMR, please get an appt with Dr. Odie Sera ASAP at [facility].  Please let the Froid know that we will give the paitent her appt,no need to call her.  Routing to Ginger.

## 2014-03-07 NOTE — Telephone Encounter (Signed)
Talked with Angie at [facility] and patient has appointment on 03/10/14 @ 3:15. She is aware that they are NOT to call the patient we will call her.

## 2014-03-07 NOTE — Telephone Encounter (Signed)
Message copied by Marlou Porch on Tue Mar 07, 2014 11:24 AM ------      Message from: Idamae Schuller      Created: Tue Mar 07, 2014 11:18 AM                   ----- Message -----         From: Daneil Dolin, MD         Sent: 03/07/2014  11:06 AM           To: Farrel Gobble, MD, Idamae Schuller            Thanks for your timely response. Will arrange interventional radiology biopsy of pancreatic mass prior to your consultation appointment.      ----- Message -----         From: Farrel Gobble, MD         Sent: 03/07/2014  10:42 AM           To: Daneil Dolin, MD            Robert: Please arrange for fine-needle aspiration of the pancreatic mass by interventional radiology prior to being seen at the [facility].. This should make the diagnosis. Depending upon her performance status, no treatment may be a reasonable option although single agent oral Xeloda is a possibility as well as single agent Gemzar. More aggressive treatment would involve Abraxane/Gemzar or combination of Gemzar and Xeloda given in a way to optimize pharmacokinetics. Thanks for the heads up. Marya Amsler       ------

## 2014-03-08 ENCOUNTER — Telehealth: Payer: Self-pay

## 2014-03-08 ENCOUNTER — Other Ambulatory Visit: Payer: Self-pay | Admitting: Radiology

## 2014-03-08 NOTE — Telephone Encounter (Signed)
[name] called this morning to inform us that her appointment was for Thursday at [facility]. She needs to be there at 10:45 am. She will need to be NPO  At 7 am. I have tried to call patient but no answer.

## 2014-03-08 NOTE — Progress Notes (Signed)
Quick Note:  Patient aware. See multiple phone notes. ______

## 2014-03-08 NOTE — Telephone Encounter (Signed)
Pt called this morning to cancel her appointment at the [facility] because she was going to have the bx at [facility] on Friday and she would be there all day. I told her to let me call them and see if she would be done it time to keep her appointment at the Cancer center. I tried to call with no answer. I will keep trying so I can let patient know what is going on.

## 2014-03-08 NOTE — Telephone Encounter (Signed)
Pt is aware of her appointment at [facility] and to keep her appointment on Friday at the [facility]

## 2014-03-09 ENCOUNTER — Ambulatory Visit (HOSPITAL_COMMUNITY)
Admission: RE | Admit: 2014-03-09 | Discharge: 2014-03-09 | Disposition: A | Discharge status: Home / Self Care | Payer: Medicare Other | Source: Ambulatory Visit | Attending: Internal Medicine | Admitting: Internal Medicine

## 2014-03-09 ENCOUNTER — Encounter (HOSPITAL_COMMUNITY): Payer: Self-pay

## 2014-03-09 DIAGNOSIS — Z7982 Long term (current) use of aspirin: Secondary | ICD-10-CM | POA: Insufficient documentation

## 2014-03-09 DIAGNOSIS — C787 Secondary malignant neoplasm of liver and intrahepatic bile duct: Secondary | ICD-10-CM | POA: Diagnosis not present

## 2014-03-09 DIAGNOSIS — K7689 Other specified diseases of liver: Secondary | ICD-10-CM | POA: Diagnosis not present

## 2014-03-09 DIAGNOSIS — Q6102 Congenital multiple renal cysts: Secondary | ICD-10-CM | POA: Diagnosis not present

## 2014-03-09 DIAGNOSIS — I10 Essential (primary) hypertension: Secondary | ICD-10-CM | POA: Insufficient documentation

## 2014-03-09 DIAGNOSIS — C252 Malignant neoplasm of tail of pancreas: Secondary | ICD-10-CM | POA: Diagnosis not present

## 2014-03-09 DIAGNOSIS — C801 Malignant (primary) neoplasm, unspecified: Secondary | ICD-10-CM | POA: Diagnosis not present

## 2014-03-09 DIAGNOSIS — R7989 Other specified abnormal findings of blood chemistry: Secondary | ICD-10-CM | POA: Diagnosis present

## 2014-03-09 DIAGNOSIS — R1011 Right upper quadrant pain: Secondary | ICD-10-CM | POA: Diagnosis present

## 2014-03-09 DIAGNOSIS — Z87891 Personal history of nicotine dependence: Secondary | ICD-10-CM | POA: Diagnosis not present

## 2014-03-09 DIAGNOSIS — K8689 Other specified diseases of pancreas: Secondary | ICD-10-CM

## 2014-03-09 LAB — CBC WITH DIFFERENTIAL/PLATELET
BASOS ABS: 0 10*3/uL (ref 0.0–0.1)
BASOS PCT: 0 % (ref 0–1)
EOS ABS: 0.1 10*3/uL (ref 0.0–0.7)
EOS PCT: 2 % (ref 0–5)
HEMATOCRIT: 39.4 % (ref 36.0–46.0)
HEMOGLOBIN: 13.4 g/dL (ref 12.0–15.0)
Lymphocytes Relative: 21 % (ref 12–46)
Lymphs Abs: 1.5 10*3/uL (ref 0.7–4.0)
MCH: 27.7 pg (ref 26.0–34.0)
MCHC: 34 g/dL (ref 30.0–36.0)
MCV: 81.6 fL (ref 78.0–100.0)
MONO ABS: 0.5 10*3/uL (ref 0.1–1.0)
MONOS PCT: 7 % (ref 3–12)
Neutro Abs: 4.8 10*3/uL (ref 1.7–7.7)
Neutrophils Relative %: 70 % (ref 43–77)
Platelets: 187 10*3/uL (ref 150–400)
RBC: 4.83 MIL/uL (ref 3.87–5.11)
RDW: 14.7 % (ref 11.5–15.5)
WBC: 6.9 10*3/uL (ref 4.0–10.5)

## 2014-03-09 LAB — COMPREHENSIVE METABOLIC PANEL
ALBUMIN: 4 g/dL (ref 3.5–5.2)
ALT: 23 U/L (ref 0–35)
ANION GAP: 13 (ref 5–15)
AST: 22 U/L (ref 0–37)
Alkaline Phosphatase: 167 U/L — ABNORMAL HIGH (ref 39–117)
BILIRUBIN TOTAL: 0.9 mg/dL (ref 0.3–1.2)
BUN: 22 mg/dL (ref 6–23)
CALCIUM: 9.4 mg/dL (ref 8.4–10.5)
CO2: 25 mEq/L (ref 19–32)
Chloride: 104 mEq/L (ref 96–112)
Creatinine, Ser: 0.94 mg/dL (ref 0.50–1.10)
GFR calc Af Amer: 63 mL/min — ABNORMAL LOW (ref >90)
GFR calc non Af Amer: 55 mL/min — ABNORMAL LOW (ref >90)
Glucose, Bld: 106 mg/dL — ABNORMAL HIGH (ref 70–99)
Potassium: 4.1 mEq/L (ref 3.7–5.3)
Sodium: 142 mEq/L (ref 137–147)
Total Protein: 8 g/dL (ref 6.0–8.3)

## 2014-03-09 LAB — APTT: aPTT: 33 seconds (ref 24–37)

## 2014-03-09 LAB — PROTIME-INR
INR: 1.09 (ref 0.00–1.49)
Prothrombin Time: 14.2 seconds (ref 11.6–15.2)

## 2014-03-09 MED ORDER — FENTANYL CITRATE 0.05 MG/ML IJ SOLN
INTRAMUSCULAR | Status: AC | PRN
  Administered 2014-03-09 (×2): 25 ug via INTRAVENOUS

## 2014-03-09 MED ORDER — HYDROCODONE-ACETAMINOPHEN 5-325 MG PO TABS
1.0000 | ORAL_TABLET | ORAL | Status: DC | PRN
  Filled 2014-03-09: qty 2

## 2014-03-09 MED ORDER — MIDAZOLAM HCL 2 MG/2ML IJ SOLN
INTRAMUSCULAR | Status: AC
  Filled 2014-03-09: qty 6

## 2014-03-09 MED ORDER — MIDAZOLAM HCL 2 MG/2ML IJ SOLN
INTRAMUSCULAR | Status: AC | PRN
  Administered 2014-03-09 (×2): 1 mg via INTRAVENOUS

## 2014-03-09 MED ORDER — SODIUM CHLORIDE 0.9 % IV SOLN
INTRAVENOUS | Status: DC
  Administered 2014-03-09: 11:00:00 via INTRAVENOUS

## 2014-03-09 MED ORDER — FENTANYL CITRATE 0.05 MG/ML IJ SOLN
INTRAMUSCULAR | Status: AC
  Filled 2014-03-09: qty 6

## 2014-03-09 NOTE — Discharge Instructions (Signed)
Liver Biopsy, Care After °Refer to this sheet in the next few weeks. These instructions provide you with information on caring for yourself after your procedure. Your health care provider may also give you more specific instructions. Your treatment has been planned according to current medical practices, but problems sometimes occur. Call your health care provider if you have any problems or questions after your procedure. °WHAT TO EXPECT AFTER THE PROCEDURE °After your procedure, it is typical to have the following: °· A small amount of discomfort in the area where the biopsy was done and in the right shoulder or shoulder blade. °· A small amount of bruising around the area where the biopsy was done and on the skin over the liver. °· Sleepiness and fatigue for the rest of the day. °HOME CARE INSTRUCTIONS  °· Rest at home for 1-2 days or as directed by your health care provider. °· Have a friend or family member stay with you for at least 24 hours. °· Because of the medicines used during the procedure, you should not do the following things in the first 24 hours: °¨ Drive. °¨ Use machinery. °¨ Be responsible for the care of other people. °¨ Sign legal documents. °¨ Take a bath or shower. °· There are many different ways to close and cover an incision, including stitches, skin glue, and adhesive strips. Follow your health care provider's instructions on: °¨ Incision care. °¨ Bandage (dressing) changes and removal. °¨ Incision closure removal. °· Do not drink alcohol in the first week. °· Do not lift more than 5 pounds or play contact sports for 2 weeks after this test. °· Take medicines only as directed by your health care provider. Do not take medicine containing aspirin or non-steroidal anti-inflammatory medicines such as ibuprofen for 1 week after this test. °· It is your responsibility to get your test results. °SEEK MEDICAL CARE IF:  °· You have increased bleeding from an incision that results in more than a  small spot of blood. °· You have redness, swelling, or increasing pain in any incisions. °· You notice a discharge or a bad smell coming from any of your incisions. °· You have a fever or chills. °SEEK IMMEDIATE MEDICAL CARE IF:  °· You develop swelling, bloating, or pain in your abdomen. °· You become dizzy or faint. °· You develop a rash. °· You are nauseous or vomit. °· You have difficulty breathing, feel short of breath, or feel faint. °· You develop chest pain. °· You have problems with your speech or vision. °· You have trouble balancing or moving your arms or legs. °Document Released: 11/15/2004 Document Revised: 09/12/2013 Document Reviewed: 06/24/2013 °ExitCare® Patient Information ©2015 [employer]. This information is not intended to replace advice given to you by your health care provider. Make sure you discuss any questions you have with your health care provider. °Conscious Sedation °Sedation is the use of medicines to promote relaxation and relieve discomfort and anxiety. Conscious sedation is a type of sedation. Under conscious sedation you are less alert than normal but are still able to respond to instructions or stimulation. Conscious sedation is used during short medical and dental procedures. It is milder than deep sedation or general anesthesia and allows you to return to your regular activities sooner.  °LET YOUR HEALTH CARE PROVIDER KNOW ABOUT:  °· Any allergies you have. °· All medicines you are taking, including vitamins, herbs, eye drops, creams, and over-the-counter medicines. °· Use of steroids (by mouth or creams). °·   Previous problems you or members of your family have had with the use of anesthetics. °· Any blood disorders you have. °· Previous surgeries you have had. °· Medical conditions you have. °· Possibility of pregnancy, if this applies. °· Use of cigarettes, alcohol, or illegal drugs. °RISKS AND COMPLICATIONS °Generally, this is a safe procedure. However, as with any  procedure, problems can occur. Possible problems include: °· Oversedation. °· Trouble breathing on your own. You may need to have a breathing tube until you are awake and breathing on your own. °· Allergic reaction to any of the medicines used for the procedure. °BEFORE THE PROCEDURE °· You may have blood tests done. These tests can help show how well your kidneys and liver are working. They can also show how well your blood clots. °· A physical exam will be done.   °· Only take medicines as directed by your health care provider. You may need to stop taking medicines (such as blood thinners, aspirin, or nonsteroidal anti-inflammatory drugs) before the procedure.   °· Do not eat or drink at least 6 hours before the procedure or as directed by your health care provider. °· Arrange for a responsible adult, family member, or friend to take you home after the procedure. He or she should stay with you for at least 24 hours after the procedure, until the medicine has worn off. °PROCEDURE  °· An intravenous (IV) catheter will be inserted into one of your veins. Medicine will be able to flow directly into your body through this catheter. You may be given medicine through this tube to help prevent pain and help you relax. °· The medical or dental procedure will be done. °AFTER THE PROCEDURE °· You will stay in a recovery area until the medicine has worn off. Your blood pressure and pulse will be checked.   °·  Depending on the procedure you had, you may be allowed to go home when you can tolerate liquids and your pain is under control. °Document Released: 01/21/2001 Document Revised: 05/03/2013 Document Reviewed: 01/03/2013 °ExitCare® Patient Information ©2015 [employer]. This information is not intended to replace advice given to you by your health care provider. Make sure you discuss any questions you have with your health care provider. ° °

## 2014-03-09 NOTE — H&P (Signed)
Chief Complaint: RUQ pain Elevated LFTs New abnormal [place] and MRI---liver and pancreatic lesions  Referring Physician(s): Rourk,Robert M  History of Present Illness: [name] is a 80 y.o. female  Pt developed abdominal pain Was seen by PMD Elevated liver functions US revealed liver lesions and sent for MRI MRI 03/02/14 reveals liver and pancreatic lesion Now scheduled for panc mass biopsy No Ca hx  Past Medical History  Diagnosis Date  . Hypertension 20 years   . Chronic diarrhea   . Anxiety   . Depression   . Kidney stone     hx/ crushed   . Hx of adenomatous colonic polyps     tubular adenomas, last found in 2008  . GERD (gastroesophageal reflux disease)   . Erosive esophagitis   . Schatzki's ring 08/26/10    Last dilated on EGD by Dr. Trevor I[facility], linear gastric erosions, BI hemigastrectomy  . Hyperplastic colon polyp 03/19/10    tcs by Dr. Gala Romney    Past Surgical History  Procedure Laterality Date  . Stomach ulcer  50 years ago     had some of her stomach removed   . Bunion removal      from both feet   . Left shoulder surgery  2009    DR HARRISON  . Colonoscopy  03/19/2010    DR Gala Romney,, normal TI, pancolonic diverticula, random colon bx neg., hyperplastic polyps removed  . Esophagogastroduodenoscopy  11/22/2003    DR Gala Romney, erosive RE, Billroth I  . Esophagogastroduodenoscopy  08/26/10    Dr. Gala Romney- moderate severe ERE, Scahtzki ring s/p dilation, Billroth I, linear gastric erosions, bx-gastric xanthelasma  . Breast lumpectomy Left   . Orif ankle fracture Right 05/22/2013    Procedure: OPEN REDUCTION INTERNAL FIXATION (ORIF) RIGHT ANKLE FRACTURE;  Surgeon: Sanjuana Kava, MD;  Location: [facility];  Service: Orthopedics;  Laterality: Right;  . Cholecystectomy  1965     Allergies: Aciphex; Amlodipine besylate-valsartan; Esomeprazole magnesium; Omeprazole; Penicillins; and Ciprofloxacin  Medications: Prior to Admission medications   Medication Sig  Start Date End Date Taking? Authorizing Provider  amLODipine-olmesartan (AZOR) 10-40 MG per tablet Take 1 tablet by mouth daily.   Yes Historical Provider, MD  aspirin 81 MG EC tablet Take 81 mg by mouth daily.     Yes Historical Provider, MD  cetirizine (ZYRTEC) 10 MG tablet Take 10 mg by mouth daily.   Yes Historical Provider, MD  cholestyramine light (PREVALITE) 4 GM/DOSE powder 1 scoop daily 02/07/14  Yes Fayrene Helper, MD  clonazePAM (KLONOPIN) 0.5 MG tablet Take 0.5 mg by mouth daily. Takes 1 tablet daily = 0.5mg  and then another if needed for anxiety.   Yes Historical Provider, MD  cycloSPORINE (RESTASIS) 0.05 % ophthalmic emulsion Place 1 drop into both eyes 2 (two) times daily.   Yes Historical Provider, MD  diclofenac sodium (VOLTAREN) 1 % GEL Apply  daily, as needed, to lower back, for uncontrolled pain 02/16/14  Yes Fayrene Helper, MD  nebivolol (BYSTOLIC) 10 MG tablet Take 10 mg by mouth daily.   Yes Historical Provider, MD  pantoprazole (PROTONIX) 20 MG tablet TAKE 1 TABLET EVERY DAY 11/18/13  Yes Fayrene Helper, MD  Probiotic Product (HEALTHY COLON PO) Take 1 tablet by mouth daily.   Yes Historical Provider, MD    Family History  Problem Relation Age of Onset  . Hypertension Mother   . Kidney failure Brother     X1 ON DIALYSIS  . Diabetes Sister   .  Cancer Sister     X2  . Hypertension Father   . Liver disease Neg Hx   . Colon cancer Neg Hx     History   Social History  . Marital Status: Widowed    Spouse Name: N/A    Number of Children: 2  . Years of Education: N/A   Occupational History  . retired from Teacher, adult education    Social History Main Topics  . Smoking status: Former Smoker    Types: Cigarettes    Quit date: 11/09/2011  . Smokeless tobacco: Never Used     Comment: quit a few weeks ago   . Alcohol Use: No  . Drug Use: No  . Sexual Activity: No   Other Topics Concern  . None   Social History Narrative  . None    Review of Systems: A 12 point  ROS discussed and pertinent positives are indicated in the HPI above.  All other systems are negative.  Review of Systems  Constitutional: Negative for activity change, appetite change and unexpected weight change.  Respiratory: Negative for chest tightness and shortness of breath.   Cardiovascular: Negative for chest pain.  Gastrointestinal: Positive for nausea and abdominal pain.  Genitourinary: Negative for difficulty urinating.  Musculoskeletal: Negative for back pain.  Psychiatric/Behavioral: Negative for behavioral problems and confusion.    Vital Signs: BP 148/65  Pulse 55  Temp(Src) 97.8 F (36.6 C) (Oral)  Resp 18  Ht 5' 3.5" (1.613 m)  Wt 71.124 kg (156 lb 12.8 oz)  BMI 27.34 kg/m2  SpO2 100%  Physical Exam  Constitutional: She is oriented to person, place, and time. She appears well-nourished.  Cardiovascular: Normal rate, regular rhythm and normal heart sounds.   No murmur heard. Pulmonary/Chest: Effort normal and breath sounds normal. She has no wheezes.  Abdominal: Soft. Bowel sounds are normal. There is no tenderness.  Musculoskeletal: Normal range of motion.  Neurological: She is alert and oriented to person, place, and time.  Skin: Skin is warm and dry.  Psychiatric: She has a normal mood and affect. Her behavior is normal. Judgment and thought content normal.    Imaging: Mr Liver W Wo Contrast  03/02/2014   CLINICAL DATA:  Followup abnormal ultrasound.  EXAM: MRI ABDOMEN WITHOUT AND WITH CONTRAST  TECHNIQUE: Multiplanar multisequence MR imaging of the abdomen was performed both before and after the administration of intravenous contrast.  CONTRAST:  37mL MULTIHANCE GADOBENATE DIMEGLUMINE 529 MG/ML IV SOLN  COMPARISON:  02/22/2014  FINDINGS: Lower chest:  No pleural effusion.  Lung bases appear clear.  Hepatobiliary: There are 2 suspicious lesions identified within the right hepatic lobe. Lesion within segment 8 of the liver measures 2.8 cm, image 19/series 2.  The second lesion within segment 7 of the liver measures 1.8 cm, image 21/series 12. A possible third lesion, also within segment 8 of the liver measures 1 cm, image 25/series 12.  Pancreas: There is a mass within the tail of pancreas measuring 5.4 x 3.0 x 2.6 cm. The mass is mostly solid with some cystic component. There is relative hypoenhancement of this mass. The mass appears to extend into the splenic hilum. There is no involvement of the celiac or SMA. The portal venous confluence and portal vein appear normal. There appears to be occlusion of the splenic vein at the level of the tail of pancreas.  Spleen: Pancreatic mass extends into the splenic hilum.  Adrenals/Urinary Tract: Normal appearance of the adrenal glands. There are bilateral renal cysts identified.  Proteinaceous stress set hemorrhagic cyst within the left mid kidney is noted without enhancement measuring 9 mm.  Stomach/Bowel: The stomach and the proximal small bowel loops are unremarkable. Visualized portions of the colon are unremarkable.  Vascular/Lymphatic: Normal caliber of the abdominal aorta. There is a peripancreatic node which measures 1.5 cm, image number 36 of series 5005. No periaortic or aortocaval adenopathy. No mesenteric adenopathy.  Other: No ascites is identified within the upper abdomen. No peritoneal nodularity noted.  Musculoskeletal: Signal from within the bone marrow appears within normal limits.  IMPRESSION: 1. Mass within the tail of pancreas is identified and is concerning for primary pancreatic carcinoma. Likely adenocarcinoma. Evidence of splenic vein involvement as well as extension into the splenic hilum. 2. Multifocal metastasis within the right hepatic lobe.   Electronically Signed   By: Kerby Moors M.D.   On: 03/02/2014 10:00   US Abdomen Limited Ruq  02/22/2014   CLINICAL DATA:  Elevated liver function tests. Altered stool habits. Right upper quadrant abdominal pain.  EXAM: US ABDOMEN LIMITED - RIGHT UPPER  QUADRANT  COMPARISON:  None.  FINDINGS: Gallbladder:  Absent  Common bile duct:  Diameter: 10 mm.  Liver:  Intrahepatic biliary dilatation, mild. Right hepatic lobe lesion measuring 2.3 by 2.4 by 2.4 cm and has central echogenicity similar to the liver but a 4 mm hypoechoic rim on image 35, without definite enhance through transmission. Similar lesion along the gallbladder fossa, 9 mm in diameter on image 48.  IMPRESSION: 1. Two target-like lesions in the liver, 1 in the right hepatic lobe in 1 near the gallbladder fossa. These are nonspecific and malignancy can have this appearance. Hepatic protocol MRI with and without contrast is recommended for further characterization. 2. Absent gallbladder. 10 mm in diameter common bile duct and mild intrahepatic biliary dilatation may simply be physiologic. These results will be called to the ordering clinician or representative by the Radiologist Assistant, and communication documented in the PACS or zVision Dashboard.   Electronically Signed   By: Sherryl Barters M.D.   On: 02/22/2014 10:58    Labs:  CBC:  Recent Labs  05/24/13 0548 05/25/13 0543 08/03/13 1026 03/09/14 1115  WBC 6.8 6.4 6.4 6.9  HGB 11.6* 10.8* 13.0 13.4  HCT 32.8* 30.5* 37.6 39.4  PLT 142* 119* 210 187    COAGS:  Recent Labs  03/09/14 1115  INR 1.09  APTT 33    BMP:  Recent Labs  05/25/13 0543 08/03/13 1026 11/04/13 0820 02/14/14 0758 03/09/14 1115  NA 142 140 139 139 142  K 3.5* 4.1 4.2 4.7 4.1  CL 100 101 103 104 104  CO2 32 30 24 28 25   GLUCOSE 113* 83 135* 106* 106*  BUN 13 26* 17 24* 22  CALCIUM 8.6 9.3 9.2 9.0 9.4  CREATININE 0.92 1.27* 1.01 0.97 0.94  GFRNONAA 56* 39* 52*  --  55*  GFRAA 65* 45* 60  --  63*    LIVER FUNCTION TESTS:  Recent Labs  11/04/13 0820 02/14/14 0758 03/09/14 1115  BILITOT 1.2 0.9 0.9  AST 14 47* 22  ALT 14 70* 23  ALKPHOS 85 127* 167*  PROT 7.3 7.1 8.0  ALBUMIN 4.4 4.1 4.0    TUMOR MARKERS: No results found for  this basename: AFPTM, CEA, CA199, CHROMGRNA,  in the last 8760 hours  Assessment and Plan:  RUQ pain Elevated liver functions Abn [place] and MRI Liver lesions and pancreatic mass Now scheduled for pancreatic mass biopsy Pt  aware of procedure benefits and risks and agreeable to proceed Consent signed andin chart  Thank you for this interesting consult.  I greatly enjoyed meeting Ashanta Amoroso and look forward to participating in their care.    I spent a total of 20 minutes face to face in clinical consultation, greater than 50% of which was counseling/coordinating care for pancreatic mass biopsy  Signed: Sumeet Geter A 03/09/2014, 12:40 PM

## 2014-03-09 NOTE — Procedures (Signed)
Interventional Radiology Procedure Note  Procedure: [place] core biopsy liver lesion Complications: None Recommendations: - bedrest x 3 hrs - Path pending  Signed,  Criselda Peaches, MD

## 2014-03-09 NOTE — H&P (Signed)
Chief Complaint: RUQ pain Elevated LFTs New abnormal [place] and MRI---liver and pancreatic lesions  Referring Physician(s): Rourk,Robert M  History of Present Illness: [name] is a 80 y.o. female  Pt developed abdominal pain Was seen by PMD Elevated liver functions US revealed liver lesions and sent for MRI MRI 03/02/14 reveals liver and pancreatic lesion Now scheduled for panc mass biopsy No Ca hx  Past Medical History  Diagnosis Date  . Hypertension 20 years   . Chronic diarrhea   . Anxiety   . Depression   . Kidney stone     hx/ crushed   . Hx of adenomatous colonic polyps     tubular adenomas, last found in 2008  . GERD (gastroesophageal reflux disease)   . Erosive esophagitis   . Schatzki's ring 08/26/10    Last dilated on EGD by Dr. Trevor Iha [facility], linear gastric erosions, BI hemigastrectomy  . Hyperplastic colon polyp 03/19/10    tcs by Dr. Gala Romney    Past Surgical History  Procedure Laterality Date  . Stomach ulcer  50 years ago     had some of her stomach removed   . Bunion removal      from both feet   . Left shoulder surgery  2009    DR HARRISON  . Colonoscopy  03/19/2010    DR Gala Romney,, normal TI, pancolonic diverticula, random colon bx neg., hyperplastic polyps removed  . Esophagogastroduodenoscopy  11/22/2003    DR Gala Romney, erosive RE, Billroth I  . Esophagogastroduodenoscopy  08/26/10    Dr. Gala Romney- moderate severe ERE, Scahtzki ring s/p dilation, Billroth I, linear gastric erosions, bx-gastric xanthelasma  . Breast lumpectomy Left   . Orif ankle fracture Right 05/22/2013    Procedure: OPEN REDUCTION INTERNAL FIXATION (ORIF) RIGHT ANKLE FRACTURE;  Surgeon: Sanjuana Kava, MD;  Location: [facility];  Service: Orthopedics;  Laterality: Right;  . Cholecystectomy  1965     Allergies: Aciphex; Amlodipine besylate-valsartan; Esomeprazole magnesium; Omeprazole; Penicillins; and Ciprofloxacin  Medications: Prior to Admission medications   Medication Sig  Start Date End Date Taking? Authorizing Provider  amLODipine-olmesartan (AZOR) 10-40 MG per tablet Take 1 tablet by mouth daily.   Yes Historical Provider, MD  aspirin 81 MG EC tablet Take 81 mg by mouth daily.     Yes Historical Provider, MD  cetirizine (ZYRTEC) 10 MG tablet Take 10 mg by mouth daily.   Yes Historical Provider, MD  cholestyramine light (PREVALITE) 4 GM/DOSE powder 1 scoop daily 02/07/14  Yes Fayrene Helper, MD  clonazePAM (KLONOPIN) 0.5 MG tablet Take 0.5 mg by mouth daily. Takes 1 tablet daily = 0.5mg  and then another if needed for anxiety.   Yes Historical Provider, MD  cycloSPORINE (RESTASIS) 0.05 % ophthalmic emulsion Place 1 drop into both eyes 2 (two) times daily.   Yes Historical Provider, MD  diclofenac sodium (VOLTAREN) 1 % GEL Apply  daily, as needed, to lower back, for uncontrolled pain 02/16/14  Yes Fayrene Helper, MD  nebivolol (BYSTOLIC) 10 MG tablet Take 10 mg by mouth daily.   Yes Historical Provider, MD  pantoprazole (PROTONIX) 20 MG tablet TAKE 1 TABLET EVERY DAY 11/18/13  Yes Fayrene Helper, MD  Probiotic Product (HEALTHY COLON PO) Take 1 tablet by mouth daily.   Yes Historical Provider, MD    Family History  Problem Relation Age of Onset  . Hypertension Mother   . Kidney failure Brother     X1 ON DIALYSIS  . Diabetes Sister   .  Cancer Sister     X2  . Hypertension Father   . Liver disease Neg Hx   . Colon cancer Neg Hx     History   Social History  . Marital Status: Widowed    Spouse Name: N/A    Number of Children: 2  . Years of Education: N/A   Occupational History  . retired from Teacher, adult education    Social History Main Topics  . Smoking status: Former Smoker    Types: Cigarettes    Quit date: 11/09/2011  . Smokeless tobacco: Never Used     Comment: quit a few weeks ago   . Alcohol Use: No  . Drug Use: No  . Sexual Activity: No   Other Topics Concern  . None   Social History Narrative  . None    Review of Systems: A 12 point  ROS discussed and pertinent positives are indicated in the HPI above.  All other systems are negative.  Review of Systems  Constitutional: Negative for activity change, appetite change and unexpected weight change.  Respiratory: Negative for chest tightness and shortness of breath.   Cardiovascular: Negative for chest pain.  Gastrointestinal: Positive for nausea and abdominal pain.  Genitourinary: Negative for difficulty urinating.  Musculoskeletal: Negative for back pain.  Psychiatric/Behavioral: Negative for behavioral problems and confusion.    Vital Signs: BP 148/65  Pulse 55  Temp(Src) 97.8 F (36.6 C) (Oral)  Resp 18  Ht 5' 3.5" (1.613 m)  Wt 71.124 kg (156 lb 12.8 oz)  BMI 27.34 kg/m2  SpO2 100%  Physical Exam  Constitutional: She is oriented to person, place, and time. She appears well-nourished.  Cardiovascular: Normal rate, regular rhythm and normal heart sounds.   No murmur heard. Pulmonary/Chest: Effort normal and breath sounds normal. She has no wheezes.  Abdominal: Soft. Bowel sounds are normal. There is no tenderness.  Musculoskeletal: Normal range of motion.  Neurological: She is alert and oriented to person, place, and time.  Skin: Skin is warm and dry.  Psychiatric: She has a normal mood and affect. Her behavior is normal. Judgment and thought content normal.    Imaging: Mr Liver W Wo Contrast  03/02/2014   CLINICAL DATA:  Followup abnormal ultrasound.  EXAM: MRI ABDOMEN WITHOUT AND WITH CONTRAST  TECHNIQUE: Multiplanar multisequence MR imaging of the abdomen was performed both before and after the administration of intravenous contrast.  CONTRAST:  53mL MULTIHANCE GADOBENATE DIMEGLUMINE 529 MG/ML IV SOLN  COMPARISON:  02/22/2014  FINDINGS: Lower chest:  No pleural effusion.  Lung bases appear clear.  Hepatobiliary: There are 2 suspicious lesions identified within the right hepatic lobe. Lesion within segment 8 of the liver measures 2.8 cm, image 19/series 2.  The second lesion within segment 7 of the liver measures 1.8 cm, image 21/series 12. A possible third lesion, also within segment 8 of the liver measures 1 cm, image 25/series 12.  Pancreas: There is a mass within the tail of pancreas measuring 5.4 x 3.0 x 2.6 cm. The mass is mostly solid with some cystic component. There is relative hypoenhancement of this mass. The mass appears to extend into the splenic hilum. There is no involvement of the celiac or SMA. The portal venous confluence and portal vein appear normal. There appears to be occlusion of the splenic vein at the level of the tail of pancreas.  Spleen: Pancreatic mass extends into the splenic hilum.  Adrenals/Urinary Tract: Normal appearance of the adrenal glands. There are bilateral renal cysts identified.  Proteinaceous stress set hemorrhagic cyst within the left mid kidney is noted without enhancement measuring 9 mm.  Stomach/Bowel: The stomach and the proximal small bowel loops are unremarkable. Visualized portions of the colon are unremarkable.  Vascular/Lymphatic: Normal caliber of the abdominal aorta. There is a peripancreatic node which measures 1.5 cm, image number 36 of series 5005. No periaortic or aortocaval adenopathy. No mesenteric adenopathy.  Other: No ascites is identified within the upper abdomen. No peritoneal nodularity noted.  Musculoskeletal: Signal from within the bone marrow appears within normal limits.  IMPRESSION: 1. Mass within the tail of pancreas is identified and is concerning for primary pancreatic carcinoma. Likely adenocarcinoma. Evidence of splenic vein involvement as well as extension into the splenic hilum. 2. Multifocal metastasis within the right hepatic lobe.   Electronically Signed   By: Kerby Moors M.D.   On: 03/02/2014 10:00   US Abdomen Limited Ruq  02/22/2014   CLINICAL DATA:  Elevated liver function tests. Altered stool habits. Right upper quadrant abdominal pain.  EXAM: US ABDOMEN LIMITED - RIGHT UPPER  QUADRANT  COMPARISON:  None.  FINDINGS: Gallbladder:  Absent  Common bile duct:  Diameter: 10 mm.  Liver:  Intrahepatic biliary dilatation, mild. Right hepatic lobe lesion measuring 2.3 by 2.4 by 2.4 cm and has central echogenicity similar to the liver but a 4 mm hypoechoic rim on image 35, without definite enhance through transmission. Similar lesion along the gallbladder fossa, 9 mm in diameter on image 48.  IMPRESSION: 1. Two target-like lesions in the liver, 1 in the right hepatic lobe in 1 near the gallbladder fossa. These are nonspecific and malignancy can have this appearance. Hepatic protocol MRI with and without contrast is recommended for further characterization. 2. Absent gallbladder. 10 mm in diameter common bile duct and mild intrahepatic biliary dilatation may simply be physiologic. These results will be called to the ordering clinician or representative by the Radiologist Assistant, and communication documented in the PACS or zVision Dashboard.   Electronically Signed   By: Sherryl Barters M.D.   On: 02/22/2014 10:58    Labs:  CBC:  Recent Labs  05/24/13 0548 05/25/13 0543 08/03/13 1026 03/09/14 1115  WBC 6.8 6.4 6.4 6.9  HGB 11.6* 10.8* 13.0 13.4  HCT 32.8* 30.5* 37.6 39.4  PLT 142* 119* 210 187    COAGS:  Recent Labs  03/09/14 1115  INR 1.09  APTT 33    BMP:  Recent Labs  05/25/13 0543 08/03/13 1026 11/04/13 0820 02/14/14 0758 03/09/14 1115  NA 142 140 139 139 142  K 3.5* 4.1 4.2 4.7 4.1  CL 100 101 103 104 104  CO2 32 30 24 28 25   GLUCOSE 113* 83 135* 106* 106*  BUN 13 26* 17 24* 22  CALCIUM 8.6 9.3 9.2 9.0 9.4  CREATININE 0.92 1.27* 1.01 0.97 0.94  GFRNONAA 56* 39* 52*  --  55*  GFRAA 65* 45* 60  --  63*    LIVER FUNCTION TESTS:  Recent Labs  11/04/13 0820 02/14/14 0758 03/09/14 1115  BILITOT 1.2 0.9 0.9  AST 14 47* 22  ALT 14 70* 23  ALKPHOS 85 127* 167*  PROT 7.3 7.1 8.0  ALBUMIN 4.4 4.1 4.0    TUMOR MARKERS: No results found for  this basename: AFPTM, CEA, CA199, CHROMGRNA,  in the last 8760 hours  Assessment and Plan:  RUQ pain Elevated liver functions Abn [place] and MRI Liver lesions and pancreatic mass Now scheduled for pancreatic mass biopsy Pt  aware of procedure benefits and risks and agreeable to proceed Consent signed andin chart  Thank you for this interesting consult.  I greatly enjoyed meeting Amaiah Cristiano and look forward to participating in their care.    I spent a total of 20 minutes face to face in clinical consultation, greater than 50% of which was counseling/coordinating care for pancreatic mass biopsy  Signed: TURPIN,PAMELA A 03/09/2014, 12:40 PM  I have seen & examined Mrs. Ground.  Plan is to biopsy the LIVER lesions rather than the pancreatic lesion.  I explained this to the patient and her family and updated the consent.  They understand and wish to proceed.   Signed,  Criselda Peaches, MD

## 2014-03-10 ENCOUNTER — Encounter (HOSPITAL_COMMUNITY): Payer: Self-pay

## 2014-03-10 ENCOUNTER — Ambulatory Visit (HOSPITAL_COMMUNITY): Payer: Medicare Other

## 2014-03-10 ENCOUNTER — Encounter (HOSPITAL_COMMUNITY): Payer: Medicare Other | Attending: Hematology and Oncology

## 2014-03-10 VITALS — BP 159/63 | HR 63 | Temp 97.8°F | Resp 18 | Ht 63.0 in | Wt 155.3 lb

## 2014-03-10 DIAGNOSIS — I10 Essential (primary) hypertension: Secondary | ICD-10-CM | POA: Diagnosis not present

## 2014-03-10 DIAGNOSIS — C259 Malignant neoplasm of pancreas, unspecified: Secondary | ICD-10-CM | POA: Insufficient documentation

## 2014-03-10 DIAGNOSIS — C787 Secondary malignant neoplasm of liver and intrahepatic bile duct: Secondary | ICD-10-CM | POA: Insufficient documentation

## 2014-03-10 DIAGNOSIS — C252 Malignant neoplasm of tail of pancreas: Secondary | ICD-10-CM

## 2014-03-10 DIAGNOSIS — R197 Diarrhea, unspecified: Secondary | ICD-10-CM | POA: Diagnosis not present

## 2014-03-10 HISTORY — DX: Malignant neoplasm of pancreas, unspecified: C25.9

## 2014-03-10 HISTORY — DX: Malignant neoplasm of pancreas, unspecified: C78.7

## 2014-03-10 LAB — COMPREHENSIVE METABOLIC PANEL
ALT: 24 U/L (ref 0–35)
ANION GAP: 12 (ref 5–15)
AST: 24 U/L (ref 0–37)
Albumin: 3.8 g/dL (ref 3.5–5.2)
Alkaline Phosphatase: 167 U/L — ABNORMAL HIGH (ref 39–117)
BUN: 17 mg/dL (ref 6–23)
CALCIUM: 9.1 mg/dL (ref 8.4–10.5)
CO2: 26 mEq/L (ref 19–32)
CREATININE: 0.81 mg/dL (ref 0.50–1.10)
Chloride: 103 mEq/L (ref 96–112)
GFR, EST AFRICAN AMERICAN: 76 mL/min — AB (ref >90)
GFR, EST NON AFRICAN AMERICAN: 65 mL/min — AB (ref >90)
GLUCOSE: 137 mg/dL — AB (ref 70–99)
Potassium: 3.6 mEq/L — ABNORMAL LOW (ref 3.7–5.3)
Sodium: 141 mEq/L (ref 137–147)
TOTAL PROTEIN: 7.9 g/dL (ref 6.0–8.3)
Total Bilirubin: 0.7 mg/dL (ref 0.3–1.2)

## 2014-03-10 LAB — CBC WITH DIFFERENTIAL/PLATELET
Basophils Absolute: 0 10*3/uL (ref 0.0–0.1)
Basophils Relative: 0 % (ref 0–1)
EOS ABS: 0.1 10*3/uL (ref 0.0–0.7)
EOS PCT: 2 % (ref 0–5)
HEMATOCRIT: 37.4 % (ref 36.0–46.0)
Hemoglobin: 12.6 g/dL (ref 12.0–15.0)
Lymphocytes Relative: 18 % (ref 12–46)
Lymphs Abs: 1.4 10*3/uL (ref 0.7–4.0)
MCH: 27.8 pg (ref 26.0–34.0)
MCHC: 33.7 g/dL (ref 30.0–36.0)
MCV: 82.4 fL (ref 78.0–100.0)
MONOS PCT: 7 % (ref 3–12)
Monocytes Absolute: 0.5 10*3/uL (ref 0.1–1.0)
Neutro Abs: 6.1 10*3/uL (ref 1.7–7.7)
Neutrophils Relative %: 73 % (ref 43–77)
Platelets: 182 10*3/uL (ref 150–400)
RBC: 4.54 MIL/uL (ref 3.87–5.11)
RDW: 14.9 % (ref 11.5–15.5)
WBC: 8.2 10*3/uL (ref 4.0–10.5)

## 2014-03-10 NOTE — Progress Notes (Signed)
[place] A. Barnet Glasgow, M.D.  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 female who is referred by her gastroenterologist for evaluation of pancreatic cancer.  She was seen by her family physician and found to have elevated liver enzymes. She had had diarrhea prior to that and is now longer symptomatic. She denies any significant abdominal pain or back pain other than chronic back pain which she has had since spinal fusion surgery many years ago. She denies any fever, night sweats, nausea, vomiting, weight loss, cough, wheezing, PND, orthopnea, palpitations, sore throat, lower extremity swelling or redness, history of thromboses, skin rash, headache, or seizures.   PAST MEDICAL HISTORY:  has a past medical history of Hypertension (20 years ); Chronic diarrhea; Anxiety; Depression; Kidney stone; adenomatous colonic polyps; GERD (gastroesophageal reflux disease); Erosive esophagitis; Schatzki's ring (08/26/10); and Hyperplastic colon polyp (03/19/10).     PAST SURGICAL HISTORY: Past Surgical History  Procedure Laterality Date  . Stomach ulcer  50 years ago     had some of her stomach removed   . Bunion removal      from both feet   . Left shoulder surgery  2009    DR HARRISON  . Colonoscopy  03/19/2010    DR Gala Romney,, normal TI, pancolonic diverticula, random colon bx neg., hyperplastic polyps removed  . Esophagogastroduodenoscopy  11/22/2003    DR Gala Romney, erosive RE, Billroth I  . Esophagogastroduodenoscopy  08/26/10    Dr. Gala Romney- moderate severe ERE, Scahtzki ring s/p dilation, Billroth I, linear gastric erosions, bx-gastric  xanthelasma  . Breast lumpectomy Left   . Orif ankle fracture Right 05/22/2013    Procedure: OPEN REDUCTION INTERNAL FIXATION (ORIF) RIGHT ANKLE FRACTURE;  Surgeon: Sanjuana Kava, MD;  Location: [facility];  Service: Orthopedics;  Laterality: Right;  . Cholecystectomy  1965      CURRENT MEDICATIONS: has a current medication list which includes the following prescription(s): amlodipine-olmesartan, aspirin, cetirizine, cholestyramine light, clonazepam, cyclosporine, diclofenac sodium, nebivolol, pantoprazole, and probiotic product.   ALLERGIES: Aciphex; Amlodipine besylate-valsartan; Esomeprazole magnesium; Omeprazole; Penicillins; and Ciprofloxacin   SOCIAL HISTORY:  reports that she quit smoking about 2 years ago. Her smoking use included Cigarettes. She smoked 0.00 packs per day. She has never used smokeless tobacco. She reports that she does not drink alcohol or use illicit drugs.   FAMILY HISTORY: family history includes Cancer in her sister; Diabetes in her sister; Hypertension in her father and mother; Kidney failure in her brother. There is no history of Liver disease or Colon cancer.    REVIEW OF SYSTEMS:  Other than that discussed above is noncontributory.    PHYSICAL EXAM:  height is '5\' 3"'  (1.6 m) and weight is 155 lb 4.8 oz (70.444 kg). Her oral temperature is 97.8 F (36.6 C). Her blood pressure is 159/63 and her pulse is 63. Her respiration is 18.    GENERAL:alert, no distress and comfortable, looking much younger than her stated age. SKIN: skin color, texture, turgor are normal, no rashes or significant lesions EYES: normal, Conjunctiva are pink and non-injected, sclera clear OROPHARYNX:no exudate, no erythema and lips, buccal mucosa, and tongue normal  NECK:  supple, thyroid normal size, non-tender, without nodularity CHEST: Normal [facility] diameter with no breast masses. LYMPH:  no palpable lymphadenopathy in the cervical, axillary or inguinal LUNGS: clear to auscultation and  percussion with normal breathing effort HEART: regular rate & rhythm and no murmurs ABDOMEN:abdomen soft, non-tender and normal bowel sounds. Puncture wound from liver biopsy anteriorly in the right upper quadrant is healed well. Liver and spleen not palpable. No free fluid wave or shifting dullness. MUSCULOSKELETALl:no cyanosis of digits, no clubbing or edema . Minimal lumbar spine tenderness. NEURO: alert & oriented x 3 with fluent speech, no focal motor/sensory deficits    LABORATORY DATA:  Office Visit on 03/10/2014  Component Date Value Ref Range Status  . WBC 03/10/2014 8.2  4.0 - 10.5 K/uL Final  . RBC 03/10/2014 4.54  3.87 - 5.11 MIL/uL Final  . Hemoglobin 03/10/2014 12.6  12.0 - 15.0 g/dL Final  . HCT 03/10/2014 37.4  36.0 - 46.0 % Final  . MCV 03/10/2014 82.4  78.0 - 100.0 fL Final  . MCH 03/10/2014 27.8  26.0 - 34.0 pg Final  . MCHC 03/10/2014 33.7  30.0 - 36.0 g/dL Final  . RDW 03/10/2014 14.9  11.5 - 15.5 % Final  . Platelets 03/10/2014 182  150 - 400 K/uL Final  . Neutrophils Relative % 03/10/2014 73  43 - 77 % Final  . Neutro Abs 03/10/2014 6.1  1.7 - 7.7 K/uL Final  . Lymphocytes Relative 03/10/2014 18  12 - 46 % Final  . Lymphs Abs 03/10/2014 1.4  0.7 - 4.0 K/uL Final  . Monocytes Relative 03/10/2014 7  3 - 12 % Final  . Monocytes Absolute 03/10/2014 0.5  0.1 - 1.0 K/uL Final  . Eosinophils Relative 03/10/2014 2  0 - 5 % Final  . Eosinophils Absolute 03/10/2014 0.1  0.0 - 0.7 K/uL Final  . Basophils Relative 03/10/2014 0  0 - 1 % Final  . Basophils Absolute 03/10/2014 0.0  0.0 - 0.1 K/uL Final  . Sodium 03/10/2014 141  137 - 147 mEq/L Final  . Potassium 03/10/2014 3.6* 3.7 - 5.3 mEq/L Final  . Chloride 03/10/2014 103  96 - 112 mEq/L Final  . CO2 03/10/2014 26  19 - 32 mEq/L Final  . Glucose, Bld 03/10/2014 137* 70 - 99 mg/dL Final  . BUN 03/10/2014 17  6 - 23 mg/dL Final  . Creatinine, Ser 03/10/2014 0.81  0.50 - 1.10 mg/dL Final  . Calcium 03/10/2014 9.1  8.4 -  10.5 mg/dL Final  . Total Protein 03/10/2014 7.9  6.0 - 8.3 g/dL Final  . Albumin 03/10/2014 3.8  3.5 - 5.2 g/dL Final  . AST 03/10/2014 24  0 - 37 U/L Final  . ALT 03/10/2014 24  0 - 35 U/L Final  . Alkaline Phosphatase 03/10/2014 167* 39 - 117 U/L Final  . Total Bilirubin 03/10/2014 0.7  0.3 - 1.2 mg/dL Final  . GFR calc non Af Amer 03/10/2014 65* >90 mL/min Final  . GFR calc Af Amer 03/10/2014 76* >90 mL/min Final   Comment: (NOTE)                          The eGFR has been calculated using the CKD EPI equation.                          This calculation has not been validated in all clinical situations.  eGFR's persistently <90 mL/min signify possible Chronic Kidney                          Disease.  . Anion gap 03/10/2014 12  5 - 15 Final  . CEA 03/10/2014 0.6  0.0 - 5.0 ng/mL Final   Performed at [facility]  . CA 19-9 03/10/2014 153.1* <35.0 U/mL Final   Performed at [facility] Visit on 03/09/2014  Component Date Value Ref Range Status  . aPTT 03/09/2014 33  24 - 37 seconds Final  . WBC 03/09/2014 6.9  4.0 - 10.5 K/uL Final  . RBC 03/09/2014 4.83  3.87 - 5.11 MIL/uL Final  . Hemoglobin 03/09/2014 13.4  12.0 - 15.0 g/dL Final  . HCT 03/09/2014 39.4  36.0 - 46.0 % Final  . MCV 03/09/2014 81.6  78.0 - 100.0 fL Final  . MCH 03/09/2014 27.7  26.0 - 34.0 pg Final  . MCHC 03/09/2014 34.0  30.0 - 36.0 g/dL Final  . RDW 03/09/2014 14.7  11.5 - 15.5 % Final  . Platelets 03/09/2014 187  150 - 400 K/uL Final  . Neutrophils Relative % 03/09/2014 70  43 - 77 % Final  . Neutro Abs 03/09/2014 4.8  1.7 - 7.7 K/uL Final  . Lymphocytes Relative 03/09/2014 21  12 - 46 % Final  . Lymphs Abs 03/09/2014 1.5  0.7 - 4.0 K/uL Final  . Monocytes Relative 03/09/2014 7  3 - 12 % Final  . Monocytes Absolute 03/09/2014 0.5  0.1 - 1.0 K/uL Final  . Eosinophils Relative 03/09/2014 2  0 - 5 % Final  . Eosinophils Absolute 03/09/2014 0.1  0.0 -  0.7 K/uL Final  . Basophils Relative 03/09/2014 0  0 - 1 % Final  . Basophils Absolute 03/09/2014 0.0  0.0 - 0.1 K/uL Final  . Sodium 03/09/2014 142  137 - 147 mEq/L Final  . Potassium 03/09/2014 4.1  3.7 - 5.3 mEq/L Final  . Chloride 03/09/2014 104  96 - 112 mEq/L Final  . CO2 03/09/2014 25  19 - 32 mEq/L Final  . Glucose, Bld 03/09/2014 106* 70 - 99 mg/dL Final  . BUN 03/09/2014 22  6 - 23 mg/dL Final  . Creatinine, Ser 03/09/2014 0.94  0.50 - 1.10 mg/dL Final  . Calcium 03/09/2014 9.4  8.4 - 10.5 mg/dL Final  . Total Protein 03/09/2014 8.0  6.0 - 8.3 g/dL Final  . Albumin 03/09/2014 4.0  3.5 - 5.2 g/dL Final  . AST 03/09/2014 22  0 - 37 U/L Final  . ALT 03/09/2014 23  0 - 35 U/L Final  . Alkaline Phosphatase 03/09/2014 167* 39 - 117 U/L Final  . Total Bilirubin 03/09/2014 0.9  0.3 - 1.2 mg/dL Final  . GFR calc non Af Amer 03/09/2014 55* >90 mL/min Final  . GFR calc Af Amer 03/09/2014 63* >90 mL/min Final   Comment: (NOTE)                          The eGFR has been calculated using the CKD EPI equation.                          This calculation has not been validated in all clinical situations.  eGFR's persistently <90 mL/min signify possible Chronic Kidney                          Disease.  . Anion gap 03/09/2014 13  5 - 15 Final  . Prothrombin Time 03/09/2014 14.2  11.6 - 15.2 seconds Final  . INR 03/09/2014 1.09  0.00 - 1.49 Final  Orders Only on 02/14/2014  Component Date Value Ref Range Status  . Hepatitis B Surface Ag 02/14/2014 NEGATIVE  NEGATIVE Final  . HCV Ab 02/14/2014 NEGATIVE  NEGATIVE Final  . Hep B C IgM 02/14/2014 NON REACTIVE  NON REACTIVE Final   Comment: High levels of Hepatitis B Core IgM antibody are detectable                          during the acute stage of Hepatitis B. This antibody is used                          to differentiate current from past HBV infection.                             . Hep A IgM 02/14/2014 NON REACTIVE   NON REACTIVE Final   Comment:                            Effective March 27, 2014, Hepatitis Acute Panel (test code [phone])                          will be revised to automatically reflex to the Hepatitis C Viral RNA,                          Quantitative, Real-Time PCR assay if the Hepatitis C antibody                          screening result is Reactive. This action is being taken to ensure                          that the CDC/USPSTF recommended HCV diagnostic algorithm with the                          appropriate test reflex needed for accurate interpretation is                          followed.                               Urinalysis    Component Value Date/Time   BILIRUBINUR negative 07/21/2011 1020   PROTEINUR negative 07/21/2011 1020   UROBILINOGEN 0.2 07/21/2011 1020   NITRITE negative 07/21/2011 1020   LEUKOCYTESUR small (1+) 07/21/2011 1020      '@RADIOGRAPHY' :  MM DIGITAL SCREENING BILATERAL Status: Final result            Study Result    CLINICAL DATA: Screening.  EXAM:  DIGITAL SCREENING BILATERAL MAMMOGRAM WITH CAD  COMPARISON: Previous exam(s).  ACR Breast Density Category b: There are scattered areas of  fibroglandular  density.  FINDINGS:  There are no findings suspicious for malignancy. Images were  processed with CAD.  IMPRESSION:  No mammographic evidence of malignancy. A result letter of this  screening mammogram will be mailed directly to the patient.  RECOMMENDATION:  Screening mammogram in one year. (Code:SM-B-01Y)  BI-RADS CATEGORY 1: Negative.  Electronically Signed  By: Conchita Paris       Mr Liver W Wo Contrast  03/02/2014   CLINICAL DATA:  Followup abnormal ultrasound.  EXAM: MRI ABDOMEN WITHOUT AND WITH CONTRAST  TECHNIQUE: Multiplanar multisequence MR imaging of the abdomen was performed both before and after the administration of intravenous contrast.  CONTRAST:  78m MULTIHANCE GADOBENATE DIMEGLUMINE 529 MG/ML IV SOLN   COMPARISON:  02/22/2014  FINDINGS: Lower chest:  No pleural effusion.  Lung bases appear clear.  Hepatobiliary: There are 2 suspicious lesions identified within the right hepatic lobe. Lesion within segment 8 of the liver measures 2.8 cm, image 19/series 2. The second lesion within segment 7 of the liver measures 1.8 cm, image 21/series 12. A possible third lesion, also within segment 8 of the liver measures 1 cm, image 25/series 12.  Pancreas: There is a mass within the tail of pancreas measuring 5.4 x 3.0 x 2.6 cm. The mass is mostly solid with some cystic component. There is relative hypoenhancement of this mass. The mass appears to extend into the splenic hilum. There is no involvement of the celiac or SMA. The portal venous confluence and portal vein appear normal. There appears to be occlusion of the splenic vein at the level of the tail of pancreas.  Spleen: Pancreatic mass extends into the splenic hilum.  Adrenals/Urinary Tract: Normal appearance of the adrenal glands. There are bilateral renal cysts identified. Proteinaceous stress set hemorrhagic cyst within the left mid kidney is noted without enhancement measuring 9 mm.  Stomach/Bowel: The stomach and the proximal small bowel loops are unremarkable. Visualized portions of the colon are unremarkable.  Vascular/Lymphatic: Normal caliber of the abdominal aorta. There is a peripancreatic node which measures 1.5 cm, image number 36 of series 5005. No periaortic or aortocaval adenopathy. No mesenteric adenopathy.  Other: No ascites is identified within the upper abdomen. No peritoneal nodularity noted.  Musculoskeletal: Signal from within the bone marrow appears within normal limits.  IMPRESSION: 1. Mass within the tail of pancreas is identified and is concerning for primary pancreatic carcinoma. Likely adenocarcinoma. Evidence of splenic vein involvement as well as extension into the splenic hilum. 2. Multifocal metastasis within the right hepatic lobe.    Electronically Signed   By: TKerby MoorsM.D.   On: 03/02/2014 10:00   [place]  03/09/2014   CLINICAL DATA:  80year old female with newly diagnosed mass in the pancreatic tail and multiple hypoechoic liver lesions concerning for hepatic metastatic disease. Ultrasound-guided core biopsy is warranted to facilitate tissue diagnosis.  EXAM: ULTRASOUND BIOPSY CORE LIVER  Date: 03/09/2014  PROCEDURE: 1. Ultrasound-guided core biopsy of liver lesion Interventional Radiologist:  HCriselda Peaches MD  ANESTHESIA/SEDATION: Moderate (conscious) sedation was used. To mg Versed, 50 mcg Fentanyl were administered intravenously. The patient's vital signs were monitored continuously by radiology nursing throughout the procedure.  Sedation Time: 13 minutes  TECHNIQUE: Informed consent was obtained from the patient following explanation of the procedure, risks, benefits and alternatives. The patient understands, agrees and consents for the procedure. All questions were addressed. A time out was performed.  The right upper quadrant was interrogated with ultrasound. A 1 cm hypoechoic nodule was  identified in the left hepatic lobe (segment 4B) adjacent to the gallbladder fossa.  A suitable skin entry site was selected and marked. The region was then sterilely prepped and draped in the standard fashion using Betadine skin prep. Local anesthesia was attained by infiltration with 1% lidocaine. A small dermatotomy was made. Under real-time sonographic guidance, a 17 gauge introducer needle was advanced through the liver and to the margin of the lesion. Multiple 18 gauge core biopsies were then coaxially obtained using the BioPince automated biopsy device. Biopsy specimens were placed in formalin and delivered to pathology for further analysis.  As the introducer needle was removed, the biopsy tract was embolized with a Gel-Foam slurry. Post biopsy ultrasound imaging demonstrates no perihepatic hematoma or evidence of active  hemorrhage. The patient tolerated the procedure well.  COMPLICATIONS: None immediate  IMPRESSION: Technically successful ultrasound-guided core biopsy of a 1 cm lesion in hepatic segment 4B.  Signed,  Criselda Peaches, MD  [facility]  [facility]   Electronically Signed   By: Jacqulynn Cadet M.D.   On: 03/09/2014 17:06   US Abdomen Limited Ruq  02/22/2014   CLINICAL DATA:  Elevated liver function tests. Altered stool habits. Right upper quadrant abdominal pain.  EXAM: US ABDOMEN LIMITED - RIGHT UPPER QUADRANT  COMPARISON:  None.  FINDINGS: Gallbladder:  Absent  Common bile duct:  Diameter: 10 mm.  Liver:  Intrahepatic biliary dilatation, mild. Right hepatic lobe lesion measuring 2.3 by 2.4 by 2.4 cm and has central echogenicity similar to the liver but a 4 mm hypoechoic rim on image 35, without definite enhance through transmission. Similar lesion along the gallbladder fossa, 9 mm in diameter on image 48.  IMPRESSION: 1. Two target-like lesions in the liver, 1 in the right hepatic lobe in 1 near the gallbladder fossa. These are nonspecific and malignancy can have this appearance. Hepatic protocol MRI with and without contrast is recommended for further characterization. 2. Absent gallbladder. 10 mm in diameter common bile duct and mild intrahepatic biliary dilatation may simply be physiologic. These results will be called to the ordering clinician or representative by the Radiologist Assistant, and communication documented in the PACS or zVision Dashboard.   Electronically Signed   By: Sherryl Barters M.D.   On: 02/22/2014 10:58    PATHOLOGY:  FINAL for ADDASYN, MCBREEN ([id number]) Patient: SHAWNA, WEARING Collected: 03/09/2014 Client: [facility] Accession: [id number] Received: 03/09/2014 Jacqulynn Cadet, MD DOB: 17-Nov-1928 Age: 75 Gender: F Reported: 03/10/2014 [address] Patient Ph: [phone] MRN #: [id number] [place], [place]  [address] Visit #: [id number] Chart #: Phone: [phone] Fax: CC: Manus Rudd REPORT OF SURGICAL PATHOLOGY FINAL DIAGNOSIS Diagnosis Liver, needle/core biopsy - METASTATIC ADENOCARCINOMA, SEE COMMENT. Diagnosis Note Given the clinical impression of pancreatic tail mass and the morphology, these findings are consistent with a pancreatic primary. The case was discussed with Dr. Gala Romney on 03/10/2014. Vicente Males MD Pathologist, Electronic Signature (Case signed 03/10/2014) Specimen Gross and Clinical Information Specimen(s) Obtained: Liver, needle/core biopsy Specimen Clinical Information Pancreatic tail mass with liver lesions concerning for mets pancreatic adenoca (je) Gross Received in formalin are five cores of red brown to gray white soft tissue ranging from 0.2 x 0.1 cm to 0.7 x 0.1 cm, submitted in toto in one block. (SSW:kh 03-10-14) Report signed out from the following location(s) Technical Component performed at [facility]. [address],[place] [address].BJSE:[id number]., Interpretation performed [place] [place], [place] [address].  CLIA #: [id number],     IMPRESSION:  #1. 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 #2. Hypertension, controlled. #3. Diarrhea, resolved on probiotic and pantoprazole along with cholestyramine light.   PLAN:  #1. In the presence of her sister and her son, a discussion was held about pancreatic cancer and its dim prognosis without treatment. She was advised that currently her performance status is 0 and that she has a high probability of responding to treatment based upon that performance status. The goal of therapy is to maintain her current state of good health and not allow the disease to progress. #2. Life port insertion. #3. Nurse navigator interview for discussion of the use of Abraxane/Gemzar IV for 3 weeks out of  every 4 if possible. The family was quoted treatment on days 1 and 8 every 28 days would be more realized above. #4. Follow-up on 03/22/2014 to initiate treatment.   Doroteo Bradford, MD 03/11/2014 1:11 PM   DISCLAIMER:  This note was dictated with voice recognition softwre.  Similar sounding words can inadvertently be transcribed inaccurately and may not be corrected upon review.

## 2014-03-10 NOTE — Patient Instructions (Signed)
[name] Discharge Instructions  RECOMMENDATIONS MADE BY THE CONSULTANT AND ANY TEST RESULTS WILL BE SENT TO YOUR REFERRING PHYSICIAN.  EXAM FINDINGS BY THE PHYSICIAN TODAY AND SIGNS OR SYMPTOMS TO REPORT TO CLINIC OR PRIMARY PHYSICIAN: Exam and findings as discussed by Dr.Formanek.  MEDICATIONS PRESCRIBED:  Continue as prescribed.  INSTRUCTIONS/FOLLOW-UP: Lab work today. We will call you if there are any unexpected abnormal results. We will scheduled you for port-a-cath placement. We will have you meet with our nurse navigator to discuss chemotherapy and set up schedules. We will plan to start chemo in about 10 days. We will call you with those appointments no later than Monday. Return to clinic as scheduled.   Thank you for choosing [place] to provide your oncology and hematology care.  To afford each patient quality time with our providers, please arrive at least 15 minutes before your scheduled appointment time.  With your help, our goal is to use those 15 minutes to complete the necessary work-up to ensure our physicians have the information they need to help with your evaluation and healthcare recommendations.    Effective January 1st, 2014, we ask that you re-schedule your appointment with our physicians should you arrive 10 or more minutes late for your appointment.  We strive to give you quality time with our providers, and arriving late affects you and other patients whose appointments are after yours.    Again, thank you for choosing [facility].  Our hope is that these requests will decrease the amount of time that you wait before being seen by our physicians.       _____________________________________________________________  Should you have questions after your visit to [facility], please contact our office at [phone] between the hours of 8:30 a.m. and 4:30 p.m.  Voicemails left after 4:30 p.m. will  not be returned until the following business day.  For prescription refill requests, have your pharmacy contact our office with your prescription refill request.    _______________________________________________________________  We hope that we have given you very good care.  You may receive a patient satisfaction survey in the mail, please complete it and return it as soon as possible.  We value your feedback!  _______________________________________________________________  Have you asked about our STAR program?  STAR stands for Survivorship Training and Rehabilitation, and this is a nationally recognized cancer care program that focuses on survivorship and rehabilitation.  Cancer and cancer treatments may cause problems, such as, pain, making you feel tired and keeping you from doing the things that you need or want to do. Cancer rehabilitation can help. Our goal is to reduce these troubling effects and help you have the best quality of life possible.  You may receive a survey from a nurse that asks questions about your current state of health.  Based on the survey results, all eligible patients will be referred to the [facility] program for an evaluation so we can better serve you!  A frequently asked questions sheet is available upon request.

## 2014-03-10 NOTE — Progress Notes (Signed)
[name] presented for labwork. Labs per MD order drawn via Peripheral Line 23 gauge needle inserted in left antecubital.  Good blood return present. Procedure without incident.  Needle removed intact. Patient tolerated procedure well.

## 2014-03-11 LAB — CANCER ANTIGEN 19-9: CA 19 9: 153.1 U/mL — AB (ref <35.0)

## 2014-03-11 LAB — CEA: CEA: 0.6 ng/mL (ref 0.0–5.0)

## 2014-03-13 ENCOUNTER — Other Ambulatory Visit: Payer: Self-pay | Admitting: Radiology

## 2014-03-13 MED ORDER — LIDOCAINE-PRILOCAINE 2.5-2.5 % EX CREA: TOPICAL_CREAM | CUTANEOUS | Status: DC

## 2014-03-13 MED ORDER — PROCHLORPERAZINE MALEATE 10 MG PO TABS: 10.0000 mg | ORAL_TABLET | Freq: Four times a day (QID) | ORAL | Status: DC | PRN

## 2014-03-13 NOTE — Patient Instructions (Addendum)
[name]   CHEMOTHERAPY INSTRUCTIONS  Prior to receiving chemo you will receive 2 meds: Dexamethasone & Zofran through your IV/port. Dexamethasone is a steroid. This is being given to decrease nausea/vomiting. After receiving Dexamethasone you may feel jittery, anxious, have increased energy, and/or feel flushed in the face/neck, chest area. You may even have trouble sleeping that night. These symptoms should pass. Zofran is being given to decrease the risk of/severity of nausea/vomiting. (these medications will take 20 minutes to infuse)  Abraxane - bone marrow suppression (lowers white blood cells, lowers red blood cells, lowers platelets), hair loss, numbness/tingling in hands/feet, muscle aches, joint pain, nausea/vomiting, diarrhea, mouth sores.  (this drug takes 30 min to infuse)  Gemzar - bone marrow suppression (especially lowers red blood cells --anemia), nausea/vomiting, fever, flu-like symptoms, rash  (this drug takes 30 min to infuse)   These medications are scheduled to be given Week Day 1, Day 8, Day 15 every 28 days. It is possible that you will only be able to tolerate Days 1 & 8 every 28 days.   POTENTIAL SIDE EFFECTS OF TREATMENT: Increased Susceptibility to Infection/Bone Marrow Suppression, Nausea/Vomiting, Constipation/Diarrhea, Abdominal Cramping, Mouth Sores, Hair Thinning/Hair Loss, Changes in Character of Skin and Nails (brittleness, dryness,etc.), Pigment Changes (darkening of nail beds, palms of hands, soles of feet, etc.), Sun Sensitivity  SELF IMAGE NEEDS AND REFERRALS MADE: Referral to Look Good, Feel Better consultant    EDUCATIONAL MATERIALS GIVEN AND REVIEWED: Chemotherapy and You Book Specific Instructions Sheets: Abraxane, Gemzar, Dexamethasone, Zofran, Prochlorperazine, EMLA cream   SELF CARE ACTIVITIES WHILE ON CHEMOTHERAPY: Increase your fluid intake 48 hours prior to treatment and drink at least 2 quarts per day  after treatment., No alcohol intake., No aspirin or other medications unless approved by your oncologist., Eat foods that are light and easy to digest., Eat foods at cold or room temperature., No fried, fatty, or spicy foods immediately before or after treatment., Have teeth cleaned professionally before starting treatment. Keep dentures and partial plates clean., Use soft toothbrush and do not use mouthwashes that contain alcohol. Biotene is a good mouthwash that is available at most pharmacies or may be ordered by calling [phone]., Use warm salt water gargles (1 teaspoon salt per 1 quart warm water) before and after meals and at bedtime. Or you may rinse with 2 tablespoons of three -percent hydrogen peroxide mixed in eight ounces of water., Always use sunscreen with SPF (Sun Protection Factor) of 30 or higher., Use your nausea medication as directed to prevent nausea., Use your stool softener or laxative as directed to prevent constipation. and Use your anti-diarrheal medication as directed to stop diarrhea.  Please wash your hands for at least 30 seconds using warm soapy water. Handwashing is the #1 way to prevent the spread of germs. Stay away from sick people or people who are getting over a cold. If you develop respiratory systems such as green/yellow mucus production or productive cough or persistent cough let us know and we will see if you need an antibiotic. It is a good idea to keep a pair of gloves on when going into grocery stores/[employer] to decrease your risk of coming into contact with germs on the carts, etc. Carry alcohol hand gel with you at all times and use it frequently if out in public. All foods need to be cooked thoroughly. No raw foods. No medium or undercooked meats, eggs. If your food is cooked medium well, it does not  need to be hot pink or saturated with bloody liquid at all. Vegetables and fruits need to be washed/rinsed under the faucet with a dish detergent before being  consumed. You can eat raw fruits and vegetables unless we tell you otherwise but it would be best if you cooked them or bought frozen. Do not eat off of salad bars or hot bars unless you really trust the cleanliness of the restaurant. If you need dental work, please let us know before you go for your appointment so that we can coordinate the best possible time for you in regards to your chemo regimen. You need to also let your dentist know that you are actively taking chemo. We may need to do labs prior to your dental appointment. We also want your bowels moving at least every other day. If this is not happening, we need to know so that we can get you on a bowel regimen to help you go.     MEDICATIONS: You have been given prescriptions for the following medications:   EMLA cream. Apply a quarter size amount to port site 1 hour prior to chemo. Do not rub in. Cover with plastic wrap.  Compazine 10mg  tablet. Take 1 tablet every 6 hours as needed for nausea/vomiting.   Over-the-Counter:   Colace 100mg  capsule. May take 1-6 capsules daily to soften stool. This will not make you have a bowel movement. This will only make your stool softer.   Senna (Senokot S). May take 1-4 tablets daily as needed for mild constipation.   Milk of Magnesia. May take up to 8 tablespoons in a 24 hour period. Start with 1-3 tablespoons and if no BM in 6-8 hours increase up to a maximum of 8 tablespoons in a 24 hour period.     SYMPTOMS TO REPORT AS SOON AS POSSIBLE AFTER TREATMENT:  FEVER GREATER THAN 100.5 F  CHILLS WITH OR WITHOUT FEVER  NAUSEA AND VOMITING THAT IS NOT CONTROLLED WITH YOUR NAUSEA MEDICATION  UNUSUAL SHORTNESS OF BREATH  UNUSUAL BRUISING OR BLEEDING  TENDERNESS IN MOUTH AND THROAT WITH OR WITHOUT PRESENCE OF ULCERS  URINARY PROBLEMS  BOWEL PROBLEMS  UNUSUAL RASH    Wear comfortable clothing and clothing appropriate for easy access to any Portacath or PICC line. Let us know if there is  anything that we can do to make your therapy better!      I have been informed and understand all of the instructions given to me and have received a copy. I have been instructed to call the clinic [phone] or my family physician as soon as possible for continued medical care, if indicated. I do not have any more questions at this time but understand that I may call the [facility] or the Patient Navigator at [phone] during office hours should I have questions or need assistance in obtaining follow-up care.       Nanoparticle Albumin-Bound Paclitaxel injection What is this medicine? NANOPARTICLE ALBUMIN-BOUND PACLITAXEL (Na no PAHR ti kuhl al BYOO muhn-bound PAK li TAX el) is a chemotherapy drug. It targets fast dividing cells, like cancer cells, and causes these cells to die. This medicine is used to treat advanced breast cancer and advanced lung cancer. This medicine may be used for other purposes; ask your health care provider or pharmacist if you have questions. COMMON BRAND NAME(S): Abraxane What should I tell my health care provider before I take this medicine? They need to know if you have any of these  conditions: -kidney disease -liver disease -low blood counts, like low platelets, red blood cells, or white blood cells -recent or ongoing radiation therapy -an unusual or allergic reaction to paclitaxel, albumin, other chemotherapy, other medicines, foods, dyes, or preservatives -pregnant or trying to get pregnant -breast-feeding How should I use this medicine? This drug is given as an infusion into a vein. It is administered in a hospital or clinic by a specially trained health care professional. Talk to your pediatrician regarding the use of this medicine in children. Special care may be needed. Overdosage: If you think you have taken too much of this medicine contact a poison control center or emergency room at once. NOTE: This medicine is only for you. Do not  share this medicine with others. What if I miss a dose? It is important not to miss your dose. Call your doctor or health care professional if you are unable to keep an appointment. What may interact with this medicine? -cyclosporine -diazepam -ketoconazole -medicines to increase blood counts like filgrastim, pegfilgrastim, sargramostim -other chemotherapy drugs like cisplatin, doxorubicin, epirubicin, etoposide, teniposide, vincristine -quinidine -testosterone -vaccines -verapamil Talk to your doctor or health care professional before taking any of these medicines: -acetaminophen -aspirin -ibuprofen -ketoprofen -naproxen This list may not describe all possible interactions. Give your health care provider a list of all the medicines, herbs, non-prescription drugs, or dietary supplements you use. Also tell them if you smoke, drink alcohol, or use illegal drugs. Some items may interact with your medicine. What should I watch for while using this medicine? Your condition will be monitored carefully while you are receiving this medicine. You will need important blood work done while you are taking this medicine. This drug may make you feel generally unwell. This is not uncommon, as chemotherapy can affect healthy cells as well as cancer cells. Report any side effects. Continue your course of treatment even though you feel ill unless your doctor tells you to stop. In some cases, you may be given additional medicines to help with side effects. Follow all directions for their use. Call your doctor or health care professional for advice if you get a fever, chills or sore throat, or other symptoms of a cold or flu. Do not treat yourself. This drug decreases your body's ability to fight infections. Try to avoid being around people who are sick. This medicine may increase your risk to bruise or bleed. Call your doctor or health care professional if you notice any unusual bleeding. Be careful brushing  and flossing your teeth or using a toothpick because you may get an infection or bleed more easily. If you have any dental work done, tell your dentist you are receiving this medicine. Avoid taking products that contain aspirin, acetaminophen, ibuprofen, naproxen, or ketoprofen unless instructed by your doctor. These medicines may hide a fever. Do not become pregnant while taking this medicine. Women should inform their doctor if they wish to become pregnant or think they might be pregnant. There is a potential for serious side effects to an unborn child. Talk to your health care professional or pharmacist for more information. Do not breast-feed an infant while taking this medicine. Men are advised not to father a child while receiving this medicine. What side effects may I notice from receiving this medicine? Side effects that you should report to your doctor or health care professional as soon as possible: -allergic reactions like skin rash, itching or hives, swelling of the face, lips, or tongue -low blood counts -  This drug may decrease the number of white blood cells, red blood cells and platelets. You may be at increased risk for infections and bleeding. -signs of infection - fever or chills, cough, sore throat, pain or difficulty passing urine -signs of decreased platelets or bleeding - bruising, pinpoint red spots on the skin, black, tarry stools, nosebleeds -signs of decreased red blood cells - unusually weak or tired, fainting spells, lightheadedness -breathing problems -changes in vision -chest pain -high or low blood pressure -mouth sores -nausea and vomiting -pain, swelling, redness or irritation at the injection site -pain, tingling, numbness in the hands or feet -slow or irregular heartbeat -swelling of the ankle, feet, hands Side effects that usually do not require medical attention (report to your doctor or health care professional if they continue or are bothersome): -aches,  pains -changes in the color of fingernails -diarrhea -hair loss -loss of appetite This list may not describe all possible side effects. Call your doctor for medical advice about side effects. You may report side effects to FDA at 1-800-FDA-1088. Where should I keep my medicine? This drug is given in a hospital or clinic and will not be stored at home. NOTE: This sheet is a summary. It may not cover all possible information. If you have questions about this medicine, talk to your doctor, pharmacist, or health care provider.  2015, [employer] Standard. (2012-06-21 16:48:50) Gemcitabine injection What is this medicine? GEMCITABINE (jem SIT a been) is a chemotherapy drug. This medicine is used to treat many types of cancer like breast cancer, lung cancer, pancreatic cancer, and ovarian cancer. This medicine may be used for other purposes; ask your health care provider or pharmacist if you have questions. COMMON BRAND NAME(S): Gemzar What should I tell my health care provider before I take this medicine? They need to know if you have any of these conditions: -blood disorders -infection -kidney disease -liver disease -recent or ongoing radiation therapy -an unusual or allergic reaction to gemcitabine, other chemotherapy, other medicines, foods, dyes, or preservatives -pregnant or trying to get pregnant -breast-feeding How should I use this medicine? This drug is given as an infusion into a vein. It is administered in a hospital or clinic by a specially trained health care professional. Talk to your pediatrician regarding the use of this medicine in children. Special care may be needed. Overdosage: If you think you have taken too much of this medicine contact a poison control center or emergency room at once. NOTE: This medicine is only for you. Do not share this medicine with others. What if I miss a dose? It is important not to miss your dose. Call your doctor or health care  professional if you are unable to keep an appointment. What may interact with this medicine? -medicines to increase blood counts like filgrastim, pegfilgrastim, sargramostim -some other chemotherapy drugs like cisplatin -vaccines Talk to your doctor or health care professional before taking any of these medicines: -acetaminophen -aspirin -ibuprofen -ketoprofen -naproxen This list may not describe all possible interactions. Give your health care provider a list of all the medicines, herbs, non-prescription drugs, or dietary supplements you use. Also tell them if you smoke, drink alcohol, or use illegal drugs. Some items may interact with your medicine. What should I watch for while using this medicine? Visit your doctor for checks on your progress. This drug may make you feel generally unwell. This is not uncommon, as chemotherapy can affect healthy cells as well as cancer cells. Report any side effects. Continue  your course of treatment even though you feel ill unless your doctor tells you to stop. In some cases, you may be given additional medicines to help with side effects. Follow all directions for their use. Call your doctor or health care professional for advice if you get a fever, chills or sore throat, or other symptoms of a cold or flu. Do not treat yourself. This drug decreases your body's ability to fight infections. Try to avoid being around people who are sick. This medicine may increase your risk to bruise or bleed. Call your doctor or health care professional if you notice any unusual bleeding. Be careful brushing and flossing your teeth or using a toothpick because you may get an infection or bleed more easily. If you have any dental work done, tell your dentist you are receiving this medicine. Avoid taking products that contain aspirin, acetaminophen, ibuprofen, naproxen, or ketoprofen unless instructed by your doctor. These medicines may hide a fever. Women should inform their  doctor if they wish to become pregnant or think they might be pregnant. There is a potential for serious side effects to an unborn child. Talk to your health care professional or pharmacist for more information. Do not breast-feed an infant while taking this medicine. What side effects may I notice from receiving this medicine? Side effects that you should report to your doctor or health care professional as soon as possible: -allergic reactions like skin rash, itching or hives, swelling of the face, lips, or tongue -low blood counts - this medicine may decrease the number of white blood cells, red blood cells and platelets. You may be at increased risk for infections and bleeding. -signs of infection - fever or chills, cough, sore throat, pain or difficulty passing urine -signs of decreased platelets or bleeding - bruising, pinpoint red spots on the skin, black, tarry stools, blood in the urine -signs of decreased red blood cells - unusually weak or tired, fainting spells, lightheadedness -breathing problems -chest pain -mouth sores -nausea and vomiting -pain, swelling, redness at site where injected -pain, tingling, numbness in the hands or feet -stomach pain -swelling of ankles, feet, hands -unusual bleeding Side effects that usually do not require medical attention (report to your doctor or health care professional if they continue or are bothersome): -constipation -diarrhea -hair loss -loss of appetite -stomach upset This list may not describe all possible side effects. Call your doctor for medical advice about side effects. You may report side effects to FDA at 1-800-FDA-1088. Where should I keep my medicine? This drug is given in a hospital or clinic and will not be stored at home. NOTE: This sheet is a summary. It may not cover all possible information. If you have questions about this medicine, talk to your doctor, pharmacist, or health care provider.  2015, [employer]/Gold  Standard. (2007-09-07 18:45:54) Dexamethasone injection What is this medicine? DEXAMETHASONE (dex a METH a sone) is a corticosteroid. It is used to treat inflammation of the skin, joints, lungs, and other organs. Common conditions treated include asthma, allergies, and arthritis. It is also used for other conditions, like blood disorders and diseases of the adrenal glands. This medicine may be used for other purposes; ask your health care provider or pharmacist if you have questions. COMMON BRAND NAME(S): Decadron, Solurex What should I tell my health care provider before I take this medicine? They need to know if you have any of these conditions: -blood clotting problems -Cushing's syndrome -diabetes -glaucoma -heart problems or disease -high blood  pressure -infection like herpes, measles, tuberculosis, or chickenpox -kidney disease -liver disease -mental problems -myasthenia gravis -osteoporosis -previous heart attack -seizures -stomach, ulcer or intestine disease including colitis and diverticulitis -thyroid problem -an unusual or allergic reaction to dexamethasone, corticosteroids, other medicines, lactose, foods, dyes, or preservatives -pregnant or trying to get pregnant -breast-feeding How should I use this medicine? This medicine is for injection into a muscle, joint, lesion, soft tissue, or vein. It is given by a health care professional in a hospital or clinic setting. Talk to your pediatrician regarding the use of this medicine in children. Special care may be needed. Overdosage: If you think you have taken too much of this medicine contact a poison control center or emergency room at once. NOTE: This medicine is only for you. Do not share this medicine with others. What if I miss a dose? This may not apply. If you are having a series of injections over a prolonged period, try not to miss an appointment. Call your doctor or health care professional to reschedule if you are  unable to keep an appointment. What may interact with this medicine? Do not take this medicine with any of the following medications: -mifepristone, RU-486 -vaccines This medicine may also interact with the following medications: -amphotericin B -antibiotics like clarithromycin, erythromycin, and troleandomycin -aspirin and aspirin-like drugs -barbiturates like phenobarbital -carbamazepine -cholestyramine -cholinesterase inhibitors like donepezil, galantamine, rivastigmine, and tacrine -cyclosporine -digoxin -diuretics -ephedrine -female hormones, like estrogens or progestins and birth control pills -indinavir -isoniazid -ketoconazole -medicines for diabetes -medicines that improve muscle tone or strength for conditions like myasthenia gravis -NSAIDs, medicines for pain and inflammation, like ibuprofen or naproxen -phenytoin -rifampin -thalidomide -warfarin This list may not describe all possible interactions. Give your health care provider a list of all the medicines, herbs, non-prescription drugs, or dietary supplements you use. Also tell them if you smoke, drink alcohol, or use illegal drugs. Some items may interact with your medicine. What should I watch for while using this medicine? Your condition will be monitored carefully while you are receiving this medicine. If you are taking this medicine for a long time, carry an identification card with your name and address, the type and dose of your medicine, and your doctor's name and address. This medicine may increase your risk of getting an infection. Stay away from people who are sick. Tell your doctor or health care professional if you are around anyone with measles or chickenpox. Talk to your health care provider before you get any vaccines that you take this medicine. If you are going to have surgery, tell your doctor or health care professional that you have taken this medicine within the last twelve months. Ask your doctor  or health care professional about your diet. You may need to lower the amount of salt you eat. The medicine can increase your blood sugar. If you are a diabetic check with your doctor if you need help adjusting the dose of your diabetic medicine. What side effects may I notice from receiving this medicine? Side effects that you should report to your doctor or health care professional as soon as possible: -allergic reactions like skin rash, itching or hives, swelling of the face, lips, or tongue -black or tarry stools -change in the amount of urine -changes in vision -confusion, excitement, restlessness, a false sense of well-being -fever, sore throat, sneezing, cough, or other signs of infection, wounds that will not heal -hallucinations -increased thirst -mental depression, mood swings, mistaken feelings of self importance or  of being mistreated -pain in hips, back, ribs, arms, shoulders, or legs -pain, redness, or irritation at the injection site -redness, blistering, peeling or loosening of the skin, including inside the mouth -rounding out of face -swelling of feet or lower legs -unusual bleeding or bruising -unusual tired or weak -wounds that do not heal Side effects that usually do not require medical attention (report to your doctor or health care professional if they continue or are bothersome): -diarrhea or constipation -change in taste -headache -nausea, vomiting -skin problems, acne, thin and shiny skin -touble sleeping -unusual growth of hair on the face or body -weight gain This list may not describe all possible side effects. Call your doctor for medical advice about side effects. You may report side effects to FDA at 1-800-FDA-1088. Where should I keep my medicine? This drug is given in a hospital or clinic and will not be stored at home. NOTE: This sheet is a summary. It may not cover all possible information. If you have questions about this medicine, talk to your  doctor, pharmacist, or health care provider.  2015, [employer]/Gold Standard. (2007-08-19 14:04:12) Ondansetron injection What is this medicine? ONDANSETRON (on DAN se tron) is used to treat nausea and vomiting caused by chemotherapy. It is also used to prevent or treat nausea and vomiting after surgery. This medicine may be used for other purposes; ask your health care provider or pharmacist if you have questions. COMMON BRAND NAME(S): Zofran What should I tell my health care provider before I take this medicine? They need to know if you have any of these conditions: -heart disease -history of irregular heartbeat -liver disease -low levels of magnesium or potassium in the blood -an unusual or allergic reaction to ondansetron, granisetron, other medicines, foods, dyes, or preservatives -pregnant or trying to get pregnant -breast-feeding How should I use this medicine? This medicine is for infusion into a vein. It is given by a health care professional in a hospital or clinic setting. Talk to your pediatrician regarding the use of this medicine in children. Special care may be needed. Overdosage: If you think you have taken too much of this medicine contact a poison control center or emergency room at once. NOTE: This medicine is only for you. Do not share this medicine with others. What if I miss a dose? This does not apply. What may interact with this medicine? Do not take this medicine with any of the following medications: -apomorphine -certain medicines for fungal infections like fluconazole, itraconazole, ketoconazole, posaconazole, voriconazole -cisapride -dofetilide -dronedarone -pimozide -thioridazine -ziprasidone This medicine may also interact with the following medications: -carbamazepine -certain medicines for depression, anxiety, or psychotic disturbances -fentanyl -linezolid -MAOIs like Carbex, Eldepryl, Marplan, Nardil, and Parnate -methylene blue (injected into a  vein) -other medicines that prolong the QT interval (cause an abnormal heart rhythm) -phenytoin -rifampicin -tramadol This list may not describe all possible interactions. Give your health care provider a list of all the medicines, herbs, non-prescription drugs, or dietary supplements you use. Also tell them if you smoke, drink alcohol, or use illegal drugs. Some items may interact with your medicine. What should I watch for while using this medicine? Your condition will be monitored carefully while you are receiving this medicine. What side effects may I notice from receiving this medicine? Side effects that you should report to your doctor or health care professional as soon as possible: -allergic reactions like skin rash, itching or hives, swelling of the face, lips, or tongue -breathing problems -confusion -dizziness -  fast or irregular heartbeat -feeling faint or lightheaded, falls -fever and chills -loss of balance or coordination -seizures -sweating -swelling of the hands and feet -tightness in the chest -tremors -unusually weak or tired Side effects that usually do not require medical attention (report to your doctor or health care professional if they continue or are bothersome): -constipation or diarrhea -headache This list may not describe all possible side effects. Call your doctor for medical advice about side effects. You may report side effects to FDA at 1-800-FDA-1088. Where should I keep my medicine? This drug is given in a hospital or clinic and will not be stored at home. NOTE: This sheet is a summary. It may not cover all possible information. If you have questions about this medicine, talk to your doctor, pharmacist, or health care provider.  2015, [employer]. (2013-02-02 16:18:28) Lidocaine; Prilocaine cream What is this medicine? LIDOCAINE; PRILOCAINE (LYE doe kane; PRIL oh kane) is a topical anesthetic that causes loss of feeling in the skin and  surrounding tissues. It is used to numb the skin before procedures or injections. This medicine may be used for other purposes; ask your health care provider or pharmacist if you have questions. COMMON BRAND NAME(S): EMLA What should I tell my health care provider before I take this medicine? They need to know if you have any of these conditions: -glucose-6-phosphate deficiencies -heart disease -kidney or liver disease -methemoglobinemia -an unusual or allergic reaction to lidocaine, prilocaine, other medicines, foods, dyes, or preservatives -pregnant or trying to get pregnant -breast-feeding How should I use this medicine? This medicine is for external use only on the skin. Do not take by mouth. Follow the directions on the prescription label. Wash hands before and after use. Do not use more or leave in contact with the skin longer than directed. Do not apply to eyes or open wounds. It can cause irritation and blurred or temporary loss of vision. If this medicine comes in contact with your eyes, immediately rinse the eye with water. Do not touch or rub the eye. Contact your health care provider right away. Talk to your pediatrician regarding the use of this medicine in children. While this medicine may be prescribed for children for selected conditions, precautions do apply. Overdosage: If you think you have taken too much of this medicine contact a poison control center or emergency room at once. NOTE: This medicine is only for you. Do not share this medicine with others. What if I miss a dose? This medicine is usually only applied once prior to each procedure. It must be in contact with the skin for a period of time for it to work. If you applied this medicine later than directed, tell your health care professional before starting the procedure. What may interact with this medicine? -acetaminophen -chloroquine -dapsone -medicines to control heart rhythm -nitrates like nitroglycerin and  nitroprusside -other ointments, creams, or sprays that may contain anesthetic medicine -phenobarbital -phenytoin -quinine -sulfonamides like sulfacetamide, sulfamethoxazole, sulfasalazine and others This list may not describe all possible interactions. Give your health care provider a list of all the medicines, herbs, non-prescription drugs, or dietary supplements you use. Also tell them if you smoke, drink alcohol, or use illegal drugs. Some items may interact with your medicine. What should I watch for while using this medicine? Be careful to avoid injury to the treated area while it is numb and you are not aware of pain. Avoid scratching, rubbing, or exposing the treated area to hot or cold  temperatures until complete sensation has returned. The numb feeling will wear off a few hours after applying the cream. What side effects may I notice from receiving this medicine? Side effects that you should report to your doctor or health care professional as soon as possible: -blurred vision -chest pain -difficulty breathing -dizziness -drowsiness -fast or irregular heartbeat -skin rash or itching -swelling of your throat, lips, or face -trembling Side effects that usually do not require medical attention (report to your doctor or health care professional if they continue or are bothersome): -changes in ability to feel hot or cold -redness and swelling at the application site This list may not describe all possible side effects. Call your doctor for medical advice about side effects. You may report side effects to FDA at 1-800-FDA-1088. Where should I keep my medicine? Keep out of reach of children. Store at room temperature between 15 and 30 degrees C (59 and 86 degrees F). Keep container tightly closed. Throw away any unused medicine after the expiration date. NOTE: This sheet is a summary. It may not cover all possible information. If you have questions about this medicine, talk to your  doctor, pharmacist, or health care provider.  2015, [employer]. (2007-11-01 17:14:35) Prochlorperazine tablets What is this medicine? PROCHLORPERAZINE (proe klor PER a zeen) helps to control severe nausea and vomiting. This medicine is also used to treat schizophrenia. It can also help patients who experience anxiety that is not due to psychological illness. This medicine may be used for other purposes; ask your health care provider or pharmacist if you have questions. COMMON BRAND NAME(S): Compazine What should I tell my health care provider before I take this medicine? They need to know if you have any of these conditions: -blood disorders or disease -dementia -liver disease or jaundice -Parkinson's disease -uncontrollable movement disorder -an unusual or allergic reaction to prochlorperazine, other medicines, foods, dyes, or preservatives -pregnant or trying to get pregnant -breast-feeding How should I use this medicine? Take this medicine by mouth with a glass of water. Follow the directions on the prescription label. Take your doses at regular intervals. Do not take your medicine more often than directed. Do not stop taking this medicine suddenly. This can cause nausea, vomiting, and dizziness. Ask your doctor or health care professional for advice. Talk to your pediatrician regarding the use of this medicine in children. Special care may be needed. While this drug may be prescribed for children as young as 2 years for selected conditions, precautions do apply. Overdosage: If you think you have taken too much of this medicine contact a poison control center or emergency room at once. NOTE: This medicine is only for you. Do not share this medicine with others. What if I miss a dose? If you miss a dose, take it as soon as you can. If it is almost time for your next dose, take only that dose. Do not take double or extra doses. What may interact with this medicine? Do not take  this medicine with any of the following medications: -amoxapine -antidepressants like citalopram, escitalopram, fluoxetine, paroxetine, and sertraline -deferoxamine -dofetilide -maprotiline -tricyclic antidepressants like amitriptyline, clomipramine, imipramine, nortiptyline and others This medicine may also interact with the following medications: -lithium -medicines for pain -phenytoin -propranolol -warfarin This list may not describe all possible interactions. Give your health care provider a list of all the medicines, herbs, non-prescription drugs, or dietary supplements you use. Also tell them if you smoke, drink alcohol, or use illegal drugs. Some  items may interact with your medicine. What should I watch for while using this medicine? Visit your doctor or health care professional for regular checks on your progress. You may get drowsy or dizzy. Do not drive, use machinery, or do anything that needs mental alertness until you know how this medicine affects you. Do not stand or sit up quickly, especially if you are an older patient. This reduces the risk of dizzy or fainting spells. Alcohol may interfere with the effect of this medicine. Avoid alcoholic drinks. This medicine can reduce the response of your body to heat or cold. Dress warm in cold weather and stay hydrated in hot weather. If possible, avoid extreme temperatures like saunas, hot tubs, very hot or cold showers, or activities that can cause dehydration such as vigorous exercise. This medicine can make you more sensitive to the sun. Keep out of the sun. If you cannot avoid being in the sun, wear protective clothing and use sunscreen. Do not use sun lamps or tanning beds/booths. Your mouth may get dry. Chewing sugarless gum or sucking hard candy, and drinking plenty of water may help. Contact your doctor if the problem does not go away or is severe. What side effects may I notice from receiving this medicine? Side effects that  you should report to your doctor or health care professional as soon as possible: -blurred vision -breast enlargement in men or women -breast milk in women who are not breast-feeding -chest pain, fast or irregular heartbeat -confusion, restlessness -dark yellow or brown urine -difficulty breathing or swallowing -dizziness or fainting spells -drooling, shaking, movement difficulty (shuffling walk) or rigidity -fever, chills, sore throat -involuntary or uncontrollable movements of the eyes, mouth, head, arms, and legs -seizures -stomach area pain -unusually weak or tired -unusual bleeding or bruising -yellowing of skin or eyes Side effects that usually do not require medical attention (report to your doctor or health care professional if they continue or are bothersome): -difficulty passing urine -difficulty sleeping -headache -sexual dysfunction -skin rash, or itching This list may not describe all possible side effects. Call your doctor for medical advice about side effects. You may report side effects to FDA at 1-800-FDA-1088. Where should I keep my medicine? Keep out of the reach of children. Store at room temperature between 15 and 30 degrees C (59 and 86 degrees F). Protect from light. Throw away any unused medicine after the expiration date. NOTE: This sheet is a summary. It may not cover all possible information. If you have questions about this medicine, talk to your doctor, pharmacist, or health care provider.  2015, [employer]. (2011-09-16 16:59:39)

## 2014-03-13 NOTE — Telephone Encounter (Signed)
Thanks for all your help.

## 2014-03-14 ENCOUNTER — Other Ambulatory Visit: Payer: Self-pay | Admitting: Radiology

## 2014-03-14 ENCOUNTER — Inpatient Hospital Stay (HOSPITAL_COMMUNITY): Payer: Medicare Other

## 2014-03-15 ENCOUNTER — Other Ambulatory Visit (HOSPITAL_COMMUNITY): Payer: Self-pay | Admitting: Hematology and Oncology

## 2014-03-15 ENCOUNTER — Encounter (HOSPITAL_COMMUNITY): Payer: Self-pay

## 2014-03-15 ENCOUNTER — Ambulatory Visit (HOSPITAL_COMMUNITY)
Admission: RE | Admit: 2014-03-15 | Discharge: 2014-03-15 | Disposition: A | Discharge status: Home / Self Care | Payer: Medicare Other | Source: Ambulatory Visit | Attending: Hematology and Oncology | Admitting: Hematology and Oncology

## 2014-03-15 DIAGNOSIS — Z452 Encounter for adjustment and management of vascular access device: Secondary | ICD-10-CM | POA: Insufficient documentation

## 2014-03-15 DIAGNOSIS — C259 Malignant neoplasm of pancreas, unspecified: Secondary | ICD-10-CM | POA: Insufficient documentation

## 2014-03-15 DIAGNOSIS — I10 Essential (primary) hypertension: Secondary | ICD-10-CM | POA: Diagnosis not present

## 2014-03-15 DIAGNOSIS — Z791 Long term (current) use of non-steroidal anti-inflammatories (NSAID): Secondary | ICD-10-CM | POA: Diagnosis not present

## 2014-03-15 DIAGNOSIS — Z79899 Other long term (current) drug therapy: Secondary | ICD-10-CM | POA: Insufficient documentation

## 2014-03-15 DIAGNOSIS — C252 Malignant neoplasm of tail of pancreas: Secondary | ICD-10-CM

## 2014-03-15 DIAGNOSIS — C787 Secondary malignant neoplasm of liver and intrahepatic bile duct: Secondary | ICD-10-CM | POA: Diagnosis not present

## 2014-03-15 DIAGNOSIS — Z7982 Long term (current) use of aspirin: Secondary | ICD-10-CM | POA: Insufficient documentation

## 2014-03-15 HISTORY — DX: Malignant neoplasm of pancreas, unspecified: C25.9

## 2014-03-15 LAB — BASIC METABOLIC PANEL
Anion gap: 12 (ref 5–15)
BUN: 18 mg/dL (ref 6–23)
CALCIUM: 9.2 mg/dL (ref 8.4–10.5)
CHLORIDE: 105 meq/L (ref 96–112)
CO2: 25 mEq/L (ref 19–32)
Creatinine, Ser: 0.78 mg/dL (ref 0.50–1.10)
GFR calc Af Amer: 87 mL/min — ABNORMAL LOW (ref >90)
GFR calc non Af Amer: 75 mL/min — ABNORMAL LOW (ref >90)
Glucose, Bld: 95 mg/dL (ref 70–99)
Potassium: 4.3 mEq/L (ref 3.7–5.3)
Sodium: 142 mEq/L (ref 137–147)

## 2014-03-15 LAB — CBC
HCT: 37.5 % (ref 36.0–46.0)
HEMOGLOBIN: 12.8 g/dL (ref 12.0–15.0)
MCH: 28.1 pg (ref 26.0–34.0)
MCHC: 34.1 g/dL (ref 30.0–36.0)
MCV: 82.2 fL (ref 78.0–100.0)
Platelets: 163 10*3/uL (ref 150–400)
RBC: 4.56 MIL/uL (ref 3.87–5.11)
RDW: 14.8 % (ref 11.5–15.5)
WBC: 6.6 10*3/uL (ref 4.0–10.5)

## 2014-03-15 LAB — APTT: aPTT: 32 seconds (ref 24–37)

## 2014-03-15 LAB — PROTIME-INR
INR: 0.99 (ref 0.00–1.49)
Prothrombin Time: 13.2 seconds (ref 11.6–15.2)

## 2014-03-15 MED ORDER — HEPARIN SOD (PORK) LOCK FLUSH 100 UNIT/ML IV SOLN
INTRAVENOUS | Status: AC | PRN
  Administered 2014-03-15: 500 [IU]

## 2014-03-15 MED ORDER — HEPARIN SOD (PORK) LOCK FLUSH 100 UNIT/ML IV SOLN
INTRAVENOUS | Status: AC
  Filled 2014-03-15: qty 5

## 2014-03-15 MED ORDER — FENTANYL CITRATE 0.05 MG/ML IJ SOLN
INTRAMUSCULAR | Status: AC | PRN
  Administered 2014-03-15: 25 ug via INTRAVENOUS
  Administered 2014-03-15: 50 ug via INTRAVENOUS

## 2014-03-15 MED ORDER — MIDAZOLAM HCL 2 MG/2ML IJ SOLN
INTRAMUSCULAR | Status: AC
  Filled 2014-03-15: qty 6

## 2014-03-15 MED ORDER — SODIUM CHLORIDE 0.9 % IV SOLN: Freq: Once | INTRAVENOUS | Status: DC

## 2014-03-15 MED ORDER — FENTANYL CITRATE 0.05 MG/ML IJ SOLN
INTRAMUSCULAR | Status: AC
  Filled 2014-03-15: qty 6

## 2014-03-15 MED ORDER — LIDOCAINE-EPINEPHRINE 2 %-1:100000 IJ SOLN
INTRAMUSCULAR | Status: AC
  Filled 2014-03-15: qty 1

## 2014-03-15 MED ORDER — VANCOMYCIN HCL IN DEXTROSE 1-5 GM/200ML-% IV SOLN
1000.0000 mg | Freq: Once | INTRAVENOUS | Status: AC
  Administered 2014-03-15: 1000 mg via INTRAVENOUS
  Filled 2014-03-15: qty 200

## 2014-03-15 MED ORDER — MIDAZOLAM HCL 2 MG/2ML IJ SOLN
INTRAMUSCULAR | Status: AC | PRN
  Administered 2014-03-15: 0.5 mg via INTRAVENOUS
  Administered 2014-03-15: 1 mg via INTRAVENOUS
  Administered 2014-03-15: 0.5 mg via INTRAVENOUS

## 2014-03-15 NOTE — H&P (Signed)
Chief Complaint: "I am here for a port."  Referring Physician(s): Formanek,Gregory  History of Present Illness: [name] is a 80 y.o. female with adenocarcinoma of pancreas with liver metastatic disease. She is scheduled today for an image guided port a catheter placement for chemotherapy. She denies any chest pain, shortness of breath or palpitations. She denies any active signs of bleeding or excessive bruising. She denies any recent fever or chills. The patient denies any history of sleep apnea or chronic oxygen use. She has previously tolerated sedation without complications.    Past Medical History  Diagnosis Date  . Hypertension 20 years   . Chronic diarrhea   . Anxiety   . Depression   . Kidney stone     hx/ crushed   . Hx of adenomatous colonic polyps     tubular adenomas, last found in 2008  . GERD (gastroesophageal reflux disease)   . Erosive esophagitis   . Schatzki's ring 08/26/10    Last dilated on EGD by Dr. Trevor [facility], linear gastric erosions, BI hemigastrectomy  . Hyperplastic colon polyp 03/19/10    tcs by Dr. Gala Romney  . Pancreatic cancer 02/2014    Past Surgical History  Procedure Laterality Date  . Stomach ulcer  50 years ago     had some of her stomach removed   . Bunion removal      from both feet   . Left shoulder surgery  2009    DR HARRISON  . Colonoscopy  03/19/2010    DR Gala Romney,, normal TI, pancolonic diverticula, random colon bx neg., hyperplastic polyps removed  . Esophagogastroduodenoscopy  11/22/2003    DR Gala Romney, erosive RE, Billroth I  . Esophagogastroduodenoscopy  08/26/10    Dr. Gala Romney- moderate severe ERE, Scahtzki ring s/p dilation, Billroth I, linear gastric erosions, bx-gastric xanthelasma  . Breast lumpectomy Left   . Orif ankle fracture Right 05/22/2013    Procedure: OPEN REDUCTION INTERNAL FIXATION (ORIF) RIGHT ANKLE FRACTURE;  Surgeon: Sanjuana Kava, MD;  Location: [facility];  Service: Orthopedics;  Laterality: Right;  .  Cholecystectomy  1965     Allergies: Aciphex; Amlodipine besylate-valsartan; Esomeprazole magnesium; Omeprazole; Penicillins; and Ciprofloxacin  Medications: Prior to Admission medications   Medication Sig Start Date End Date Taking? Authorizing Provider  amLODipine-olmesartan (AZOR) 10-40 MG per tablet Take 1 tablet by mouth every morning.    Yes Historical Provider, MD  aspirin 81 MG EC tablet Take 81 mg by mouth every morning.    Yes Historical Provider, MD  cetirizine (ZYRTEC) 10 MG tablet Take 10 mg by mouth every morning.    Yes Historical Provider, MD  cholestyramine light (PREVALITE) 4 GM/DOSE powder 1 scoop daily Patient taking differently: Take 4 g by mouth every morning. 1 scoop daily 02/07/14  Yes Fayrene Helper, MD  clonazePAM (KLONOPIN) 0.5 MG tablet Take 0.5 mg by mouth 2 (two) times daily as needed for anxiety.    Yes Historical Provider, MD  cycloSPORINE (RESTASIS) 0.05 % ophthalmic emulsion Place 1 drop into both eyes 2 (two) times daily.   Yes Historical Provider, MD  diclofenac sodium (VOLTAREN) 1 % GEL Apply  daily, as needed, to lower back, for uncontrolled pain Patient taking differently: Apply 2-4 g topically daily as needed (lower bck for uncontrolled pain.).  02/16/14  Yes Fayrene Helper, MD  nebivolol (BYSTOLIC) 10 MG tablet Take 10 mg by mouth every morning.    Yes Historical Provider, MD  pantoprazole (PROTONIX) 20 MG tablet TAKE 1  TABLET EVERY DAY 11/18/13  Yes Fayrene Helper, MD  Probiotic Product (HEALTHY COLON PO) Take 1 tablet by mouth every morning.    Yes Historical Provider, MD  lidocaine-prilocaine (EMLA) cream Apply a quarter size amount to port site 1 hour prior to chemo. Do not rub in. Cover with plastic wrap. 03/13/14   Farrel Gobble, MD  pantoprazole (PROTONIX) 20 MG tablet Take 20 mg by mouth every morning.    Historical Provider, MD  prochlorperazine (COMPAZINE) 10 MG tablet Take 1 tablet (10 mg total) by mouth every 6 (six) hours as  needed for nausea or vomiting. 03/13/14   Farrel Gobble, MD    Family History  Problem Relation Age of Onset  . Hypertension Mother   . Kidney failure Brother     X1 ON DIALYSIS  . Diabetes Sister   . Cancer Sister     X2  . Hypertension Father   . Liver disease Neg Hx   . Colon cancer Neg Hx     History   Social History  . Marital Status: Widowed    Spouse Name: N/A    Number of Children: 2  . Years of Education: N/A   Occupational History  . retired from Teacher, adult education    Social History Main Topics  . Smoking status: Former Smoker    Types: Cigarettes    Quit date: 11/09/2011  . Smokeless tobacco: Never Used     Comment: quit a few weeks ago   . Alcohol Use: No  . Drug Use: No  . Sexual Activity: No   Other Topics Concern  . None   Social History Narrative    Review of Systems: A 12 point ROS discussed and pertinent positives are indicated in the HPI above.  All other systems are negative.  Review of Systems  Vital Signs: BP 150/63 mmHg  Pulse 51  Temp(Src) 97.9 F (36.6 C) (Oral)  Resp 18  Ht 5\' 3"  (1.6 m)  Wt 155 lb (70.308 kg)  BMI 27.46 kg/m2  SpO2 100%  Physical Exam  Constitutional: She is oriented to person, place, and time. No distress.  HENT:  Head: Normocephalic and atraumatic.  Cardiovascular: Normal rate and regular rhythm.  Exam reveals no gallop and no friction rub.   No murmur heard. Pulmonary/Chest: Effort normal and breath sounds normal. No respiratory distress. She has no wheezes. She has no rales.  Abdominal: Soft. Bowel sounds are normal. She exhibits no distension. There is no tenderness.  Neurological: She is alert and oriented to person, place, and time.  Skin: Skin is warm and dry. She is not diaphoretic.  Psychiatric: She has a normal mood and affect. Her behavior is normal. Thought content normal.    Imaging: Mr Liver W Wo Contrast  03/02/2014   CLINICAL DATA:  Followup abnormal ultrasound.  EXAM: MRI ABDOMEN WITHOUT AND  WITH CONTRAST  TECHNIQUE: Multiplanar multisequence MR imaging of the abdomen was performed both before and after the administration of intravenous contrast.  CONTRAST:  59mL MULTIHANCE GADOBENATE DIMEGLUMINE 529 MG/ML IV SOLN  COMPARISON:  02/22/2014  FINDINGS: Lower chest:  No pleural effusion.  Lung bases appear clear.  Hepatobiliary: There are 2 suspicious lesions identified within the right hepatic lobe. Lesion within segment 8 of the liver measures 2.8 cm, image 19/series 2. The second lesion within segment 7 of the liver measures 1.8 cm, image 21/series 12. A possible third lesion, also within segment 8 of the liver measures 1 cm, image 25/series 12.  Pancreas: There is a mass within the tail of pancreas measuring 5.4 x 3.0 x 2.6 cm. The mass is mostly solid with some cystic component. There is relative hypoenhancement of this mass. The mass appears to extend into the splenic hilum. There is no involvement of the celiac or SMA. The portal venous confluence and portal vein appear normal. There appears to be occlusion of the splenic vein at the level of the tail of pancreas.  Spleen: Pancreatic mass extends into the splenic hilum.  Adrenals/Urinary Tract: Normal appearance of the adrenal glands. There are bilateral renal cysts identified. Proteinaceous stress set hemorrhagic cyst within the left mid kidney is noted without enhancement measuring 9 mm.  Stomach/Bowel: The stomach and the proximal small bowel loops are unremarkable. Visualized portions of the colon are unremarkable.  Vascular/Lymphatic: Normal caliber of the abdominal aorta. There is a peripancreatic node which measures 1.5 cm, image number 36 of series 5005. No periaortic or aortocaval adenopathy. No mesenteric adenopathy.  Other: No ascites is identified within the upper abdomen. No peritoneal nodularity noted.  Musculoskeletal: Signal from within the bone marrow appears within normal limits.  IMPRESSION: 1. Mass within the tail of pancreas is  identified and is concerning for primary pancreatic carcinoma. Likely adenocarcinoma. Evidence of splenic vein involvement as well as extension into the splenic hilum. 2. Multifocal metastasis within the right hepatic lobe.   Electronically Signed   By: Kerby Moors M.D.   On: 03/02/2014 10:00   US Biopsy  03/09/2014   CLINICAL DATA:  80 year old female with newly diagnosed mass in the pancreatic tail and multiple hypoechoic liver lesions concerning for hepatic metastatic disease. Ultrasound-guided core biopsy is warranted to facilitate tissue diagnosis.  EXAM: ULTRASOUND BIOPSY CORE LIVER  Date: 03/09/2014  PROCEDURE: 1. Ultrasound-guided core biopsy of liver lesion Interventional Radiologist:  Criselda Peaches, MD  ANESTHESIA/SEDATION: Moderate (conscious) sedation was used. To mg Versed, 50 mcg Fentanyl were administered intravenously. The patient's vital signs were monitored continuously by radiology nursing throughout the procedure.  Sedation Time: 13 minutes  TECHNIQUE: Informed consent was obtained from the patient following explanation of the procedure, risks, benefits and alternatives. The patient understands, agrees and consents for the procedure. All questions were addressed. A time out was performed.  The right upper quadrant was interrogated with ultrasound. A 1 cm hypoechoic nodule was identified in the left hepatic lobe (segment 4B) adjacent to the gallbladder fossa.  A suitable skin entry site was selected and marked. The region was then sterilely prepped and draped in the standard fashion using Betadine skin prep. Local anesthesia was attained by infiltration with 1% lidocaine. A small dermatotomy was made. Under real-time sonographic guidance, a 17 gauge introducer needle was advanced through the liver and to the margin of the lesion. Multiple 18 gauge core biopsies were then coaxially obtained using the BioPince automated biopsy device. Biopsy specimens were placed in formalin and  delivered to pathology for further analysis.  As the introducer needle was removed, the biopsy tract was embolized with a Gel-Foam slurry. Post biopsy ultrasound imaging demonstrates no perihepatic hematoma or evidence of active hemorrhage. The patient tolerated the procedure well.  COMPLICATIONS: None immediate  IMPRESSION: Technically successful ultrasound-guided core biopsy of a 1 cm lesion in hepatic segment 4B.  Signed,  Criselda Peaches, MD  [facility]  [facility]   Electronically Signed   By: Jacqulynn Cadet M.D.   On: 03/09/2014 17:06   US Abdomen Limited Ruq  02/22/2014  CLINICAL DATA:  Elevated liver function tests. Altered stool habits. Right upper quadrant abdominal pain.  EXAM: US ABDOMEN LIMITED - RIGHT UPPER QUADRANT  COMPARISON:  None.  FINDINGS: Gallbladder:  Absent  Common bile duct:  Diameter: 10 mm.  Liver:  Intrahepatic biliary dilatation, mild. Right hepatic lobe lesion measuring 2.3 by 2.4 by 2.4 cm and has central echogenicity similar to the liver but a 4 mm hypoechoic rim on image 35, without definite enhance through transmission. Similar lesion along the gallbladder fossa, 9 mm in diameter on image 48.  IMPRESSION: 1. Two target-like lesions in the liver, 1 in the right hepatic lobe in 1 near the gallbladder fossa. These are nonspecific and malignancy can have this appearance. Hepatic protocol MRI with and without contrast is recommended for further characterization. 2. Absent gallbladder. 10 mm in diameter common bile duct and mild intrahepatic biliary dilatation may simply be physiologic. These results will be called to the ordering clinician or representative by the Radiologist Assistant, and communication documented in the PACS or zVision Dashboard.   Electronically Signed   By: Sherryl Barters M.D.   On: 02/22/2014 10:58    Labs:  CBC:  Recent Labs  08/03/13 1026 03/09/14 1115 03/10/14 1600 03/15/14 1205  WBC 6.4  6.9 8.2 6.6  HGB 13.0 13.4 12.6 12.8  HCT 37.6 39.4 37.4 37.5  PLT 210 187 182 163    COAGS:  Recent Labs  03/09/14 1115 03/15/14 1205  INR 1.09 0.99  APTT 33 32    BMP:  Recent Labs  11/04/13 0820 02/14/14 0758 03/09/14 1115 03/10/14 1600 03/15/14 1205  NA 139 139 142 141 142  K 4.2 4.7 4.1 3.6* 4.3  CL 103 104 104 103 105  CO2 24 28 25 26 25   GLUCOSE 135* 106* 106* 137* 95  BUN 17 24* 22 17 18   CALCIUM 9.2 9.0 9.4 9.1 9.2  CREATININE 1.01 0.97 0.94 0.81 0.78  GFRNONAA 52*  --  55* 65* 75*  GFRAA 60  --  63* 76* 87*    LIVER FUNCTION TESTS:  Recent Labs  11/04/13 0820 02/14/14 0758 03/09/14 1115 03/10/14 1600  BILITOT 1.2 0.9 0.9 0.7  AST 14 47* 22 24  ALT 14 70* 23 24  ALKPHOS 85 127* 167* 167*  PROT 7.3 7.1 8.0 7.9  ALBUMIN 4.4 4.1 4.0 3.8    TUMOR MARKERS:  Recent Labs  03/10/14 1600  CEA 0.6  CA199 153.1*    Assessment and Plan: Adenocarcinoma of pancreas with liver metastatic disease Request for port a catheter placement for chemotherapy. Patient has been NPO, no blood thinners taken, afebrile, labs reviewed Risks and Benefits discussed with the patient. All of the patient's questions were answered, patient is agreeable to proceed. Consent signed and in chart.    SignedHedy Jacob 03/15/2014, 1:07 PM

## 2014-03-15 NOTE — Procedures (Signed)
Successful placement of right IJ approach port-a-cath with tip at the superior caval atrial junction. The catheter is ready for immediate use. No immediate post procedural complications. 

## 2014-03-15 NOTE — Discharge Instructions (Signed)

## 2014-03-16 ENCOUNTER — Encounter (HOSPITAL_COMMUNITY): Payer: Self-pay | Admitting: Oncology

## 2014-03-17 ENCOUNTER — Encounter (HOSPITAL_COMMUNITY): Payer: Medicare Other | Attending: Hematology and Oncology

## 2014-03-17 DIAGNOSIS — C252 Malignant neoplasm of tail of pancreas: Secondary | ICD-10-CM | POA: Diagnosis not present

## 2014-03-17 DIAGNOSIS — C787 Secondary malignant neoplasm of liver and intrahepatic bile duct: Secondary | ICD-10-CM | POA: Insufficient documentation

## 2014-03-17 DIAGNOSIS — C259 Malignant neoplasm of pancreas, unspecified: Secondary | ICD-10-CM | POA: Insufficient documentation

## 2014-03-17 DIAGNOSIS — C801 Malignant (primary) neoplasm, unspecified: Secondary | ICD-10-CM | POA: Insufficient documentation

## 2014-03-17 NOTE — Progress Notes (Signed)
Chemo teaching done and consent signed for Abraxane and Gemzar. Copy of consent placed in tx room. Chemo/med calendar given to patient. Patient brought newly prescribed meds with her today to our visit.

## 2014-03-18 ENCOUNTER — Other Ambulatory Visit: Payer: Self-pay | Admitting: Family Medicine

## 2014-03-21 ENCOUNTER — Ambulatory Visit: Payer: Medicare Other | Admitting: Gastroenterology

## 2014-03-22 ENCOUNTER — Encounter (HOSPITAL_BASED_OUTPATIENT_CLINIC_OR_DEPARTMENT_OTHER): Payer: Medicare Other

## 2014-03-22 ENCOUNTER — Encounter (HOSPITAL_COMMUNITY): Payer: Self-pay

## 2014-03-22 VITALS — BP 160/67 | HR 60 | Temp 97.8°F | Resp 18 | Wt 157.9 lb

## 2014-03-22 DIAGNOSIS — R197 Diarrhea, unspecified: Secondary | ICD-10-CM

## 2014-03-22 DIAGNOSIS — I10 Essential (primary) hypertension: Secondary | ICD-10-CM | POA: Diagnosis not present

## 2014-03-22 DIAGNOSIS — Z5111 Encounter for antineoplastic chemotherapy: Secondary | ICD-10-CM | POA: Diagnosis not present

## 2014-03-22 DIAGNOSIS — C787 Secondary malignant neoplasm of liver and intrahepatic bile duct: Secondary | ICD-10-CM

## 2014-03-22 DIAGNOSIS — C259 Malignant neoplasm of pancreas, unspecified: Secondary | ICD-10-CM | POA: Diagnosis not present

## 2014-03-22 DIAGNOSIS — C252 Malignant neoplasm of tail of pancreas: Secondary | ICD-10-CM

## 2014-03-22 DIAGNOSIS — C801 Malignant (primary) neoplasm, unspecified: Secondary | ICD-10-CM | POA: Diagnosis not present

## 2014-03-22 MED ORDER — DEXAMETHASONE SODIUM PHOSPHATE 10 MG/ML IJ SOLN: 10.0000 mg | Freq: Once | INTRAMUSCULAR | Status: DC

## 2014-03-22 MED ORDER — SODIUM CHLORIDE 0.9 % IV SOLN
1000.0000 mg/m2 | Freq: Once | INTRAVENOUS | Status: AC
  Administered 2014-03-22: 1786 mg via INTRAVENOUS
  Filled 2014-03-22: qty 46.97

## 2014-03-22 MED ORDER — SODIUM CHLORIDE 0.9 % IV SOLN
Freq: Once | INTRAVENOUS | Status: AC
  Administered 2014-03-22: 12:00:00 via INTRAVENOUS

## 2014-03-22 MED ORDER — PACLITAXEL PROTEIN-BOUND CHEMO INJECTION 100 MG
125.0000 mg/m2 | Freq: Once | INTRAVENOUS | Status: AC
  Administered 2014-03-22: 225 mg via INTRAVENOUS
  Filled 2014-03-22: qty 45

## 2014-03-22 MED ORDER — HEPARIN SOD (PORK) LOCK FLUSH 100 UNIT/ML IV SOLN
500.0000 [IU] | Freq: Once | INTRAVENOUS | Status: AC | PRN
  Administered 2014-03-22: 500 [IU]
  Filled 2014-03-22 (×2): qty 5

## 2014-03-22 MED ORDER — SODIUM CHLORIDE 0.9 % IJ SOLN: 10.0000 mL | INTRAMUSCULAR | Status: DC | PRN

## 2014-03-22 MED ORDER — ONDANSETRON HCL 40 MG/20ML IJ SOLN
Freq: Once | INTRAMUSCULAR | Status: AC
  Administered 2014-03-22: 8 mg via INTRAVENOUS
  Filled 2014-03-22: qty 4

## 2014-03-22 MED ORDER — SODIUM CHLORIDE 0.9 % IV SOLN: 8.0000 mg | Freq: Once | INTRAVENOUS | Status: DC

## 2014-03-22 NOTE — Progress Notes (Signed)
[place]  OFFICE PROGRESS NOTE  [name], MD [address] [place] [address]  DIAGNOSIS: Cancer of pancreas, tail - Plan: CBC with Differential, Comprehensive metabolic panel, Cancer antigen 19-9, Cancer antigen 19-9  Liver metastases - Plan: SCHEDULING COMMUNICATION, TREATMENT CONDITIONS, 0.9 %  sodium chloride infusion, sodium chloride 0.9 % injection 10 mL, heparin lock flush 100 unit/mL, PACLitaxel-protein bound (ABRAXANE) chemo infusion 225 mg, Gemcitabine HCl (GEMZAR) 1,786 mg in sodium chloride 0.9 % 100 mL chemo infusion, DISCONTINUED: ondansetron (ZOFRAN) 8 mg in sodium chloride 0.9 % 50 mL IVPB, DISCONTINUED: dexamethasone (DECADRON) injection 10 mg  Pancreatic cancer metastasized to liver - Plan: SCHEDULING COMMUNICATION, TREATMENT CONDITIONS, 0.9 %  sodium chloride infusion, sodium chloride 0.9 % injection 10 mL, heparin lock flush 100 unit/mL, PACLitaxel-protein bound (ABRAXANE) chemo infusion 225 mg, Gemcitabine HCl (GEMZAR) 1,786 mg in sodium chloride 0.9 % 100 mL chemo infusion, DISCONTINUED: ondansetron (ZOFRAN) 8 mg in sodium chloride 0.9 % 50 mL IVPB, DISCONTINUED: dexamethasone (DECADRON) injection 10 mg  Chief Complaint  Patient presents with  . pancreas cancer with liver metastases    CURRENT THERAPY:  Cycle 1 of Abraxane/Gemzar 03/22/2014  INTERVAL HISTORY: Iliyana [name] 80 y.o. 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  Life port was inserted in the right anterior chest without complication. She denies a new complaints. Appetite has been good with no fever, night sweats, back pain, nausea, vomiting, diarrhea, constipation, peripheral paresthesias, skin rash, lower extremity swelling or redness, cough, wheezing, headache, or seizures.  MEDICAL HISTORY: Past Medical History  Diagnosis Date  . Hypertension 20 years   .  Chronic diarrhea   . Anxiety   . Depression   . Kidney stone     hx/ crushed   . Hx of adenomatous colonic polyps     tubular adenomas, last found in 2008  . GERD (gastroesophageal reflux disease)   . Erosive esophagitis   . Schatzki's ring 08/26/10    Last dilated on EGD by Dr. Trevor [facility], linear gastric erosions, BI hemigastrectomy  . Hyperplastic colon polyp 03/19/10    tcs by Dr. Gala Romney  . Pancreatic cancer 02/2014  . Pancreatic cancer metastasized to liver 03/10/2014    INTERIM HISTORY: has OVERWEIGHT; Essential hypertension; OSTEOARTHROSIS, LOCAL, PRIMARY, SHOULDER; DEGENERATIVE JOINT DISEASE, CERVICAL SPINE; RUPTURE ROTATOR CUFF; ALLERGIC RHINITIS, SEASONAL; TREMOR; GERD (gastroesophageal reflux disease); Chronic diarrhea; Hyperlipidemia; Anxiety; Primary generalized (osteo)arthritis; Back pain with radiation; Weakness of left leg; Difficulty in walking; Routine general medical examination at a health care facility; IGT (impaired glucose tolerance); Fracture of right ankle; Encounter for annual physical exam; Anemia, iron deficiency; Elevated LFTs; Change in stool habits; Back pain without sciatica; Abnormal liver ultrasound; Pancreatic cancer metastasized to liver; and Liver metastases on her problem list.     Pancreatic cancer metastasized to liver   03/10/2014 Initial Diagnosis Pancreatic cancer metastasized to liver   03/22/2014 -  Chemotherapy Abraxane/Gemzar cycle 1, day 1 on11/03/2014    ALLERGIES:  is allergic to aciphex; amlodipine besylate-valsartan; esomeprazole magnesium; omeprazole; penicillins; and ciprofloxacin.  MEDICATIONS: has a current medication list which includes the following prescription(s): amlodipine-olmesartan, aspirin, azor, bystolic, cetirizine, cholestyramine light, clonazepam, cyclosporine, diclofenac sodium, lidocaine-prilocaine, nebivolol, pantoprazole, pantoprazole, probiotic product, and prochlorperazine, and the following Facility-Administered  Medications: Gemcitabine HCl (GEMZAR) 1,786 mg in sodium chloride 0.9 % 100 mL chemo infusion, heparin lock flush, ondansetron (ZOFRAN) 8  mg, dexamethasone (DECADRON) 10 mg in sodium chloride 0.9 % 50 mL IVPB, paclitaxel-protein bound, and sodium chloride.  SURGICAL HISTORY:  Past Surgical History  Procedure Laterality Date  . Stomach ulcer  50 years ago     had some of her stomach removed   . Bunion removal      from both feet   . Left shoulder surgery  2009    DR HARRISON  . Colonoscopy  03/19/2010    DR Gala Romney,, normal TI, pancolonic diverticula, random colon bx neg., hyperplastic polyps removed  . Esophagogastroduodenoscopy  11/22/2003    DR Gala Romney, erosive RE, Billroth I  . Esophagogastroduodenoscopy  08/26/10    Dr. Gala Romney- moderate severe ERE, Scahtzki ring s/p dilation, Billroth I, linear gastric erosions, bx-gastric xanthelasma  . Breast lumpectomy Left   . Orif ankle fracture Right 05/22/2013    Procedure: OPEN REDUCTION INTERNAL FIXATION (ORIF) RIGHT ANKLE FRACTURE;  Surgeon: Sanjuana Kava, MD;  Location: [facility];  Service: Orthopedics;  Laterality: Right;  . Cholecystectomy  1965     FAMILY HISTORY: family history includes Cancer in her sister; Diabetes in her sister; Hypertension in her father and mother; Kidney failure in her brother. There is no history of Liver disease or Colon cancer.  SOCIAL HISTORY:  reports that she quit smoking about 2 years ago. Her smoking use included Cigarettes. She smoked 0.00 packs per day. She has never used smokeless tobacco. She reports that she does not drink alcohol or use illicit drugs.  REVIEW OF SYSTEMS:  Other than that discussed above is noncontributory.  PHYSICAL EXAMINATION: ECOG PERFORMANCE STATUS: 1 - Symptomatic but completely ambulatory  Blood pressure 160/67, pulse 60, temperature 97.8 F (36.6 C), temperature source Oral, resp. rate 18, weight 157 lb 14.4 oz (71.623 kg), SpO2 99 %.  GENERAL:alert, no distress and  comfortable SKIN: skin color, texture, turgor are normal, no rashes or significant lesions EYES: PERLA; Conjunctiva are pink and non-injected, sclera clear SINUSES: No redness or tenderness over maxillary or ethmoid sinuses OROPHARYNX:no exudate, no erythema on lips, buccal mucosa, or tongue. NECK: supple, thyroid normal size, non-tender, without nodularity. No masses CHEST: normal AP diameter with right anterior chest life port in place and functioning. LYMPH:  no palpable lymphadenopathy in the cervical, axillary or inguinal LUNGS: clear to auscultation and percussion with normal breathing effort HEART: regular rate & rhythm and no murmurs. ABDOMEN:abdomen soft, non-tender and normal bowel sounds. Liver spleen not enlarged. No ascites.  MUSCULOSKELETAL:no cyanosis of digits and no clubbing. Range of motion normal.  NEURO: alert & oriented x 3 with fluent speech, no focal motor/sensory deficits   LABORATORY DATA: Hospital Outpatient Visit on 03/15/2014  Component Date Value Ref Range Status  . aPTT 03/15/2014 32  24 - 37 seconds Final  . Sodium 03/15/2014 142  137 - 147 mEq/L Final  . Potassium 03/15/2014 4.3  3.7 - 5.3 mEq/L Final  . Chloride 03/15/2014 105  96 - 112 mEq/L Final  . CO2 03/15/2014 25  19 - 32 mEq/L Final  . Glucose, Bld 03/15/2014 95  70 - 99 mg/dL Final  . BUN 03/15/2014 18  6 - 23 mg/dL Final  . Creatinine, Ser 03/15/2014 0.78  0.50 - 1.10 mg/dL Final  . Calcium 03/15/2014 9.2  8.4 - 10.5 mg/dL Final  . GFR calc non Af Amer 03/15/2014 75* >90 mL/min Final  . GFR calc Af Amer 03/15/2014 87* >90 mL/min Final   Comment: (NOTE) The eGFR has been calculated using the CKD  EPI equation. This calculation has not been validated in all clinical situations. eGFR's persistently <90 mL/min signify possible Chronic Kidney Disease.   . Anion gap 03/15/2014 12  5 - 15 Final  . WBC 03/15/2014 6.6  4.0 - 10.5 K/uL Final  . RBC 03/15/2014 4.56  3.87 - 5.11 MIL/uL Final  .  Hemoglobin 03/15/2014 12.8  12.0 - 15.0 g/dL Final  . HCT 03/15/2014 37.5  36.0 - 46.0 % Final  . MCV 03/15/2014 82.2  78.0 - 100.0 fL Final  . MCH 03/15/2014 28.1  26.0 - 34.0 pg Final  . MCHC 03/15/2014 34.1  30.0 - 36.0 g/dL Final  . RDW 03/15/2014 14.8  11.5 - 15.5 % Final  . Platelets 03/15/2014 163  150 - 400 K/uL Final  . Prothrombin Time 03/15/2014 13.2  11.6 - 15.2 seconds Final  . INR 03/15/2014 0.99  0.00 - 1.49 Final  Office Visit on 03/10/2014  Component Date Value Ref Range Status  . WBC 03/10/2014 8.2  4.0 - 10.5 K/uL Final  . RBC 03/10/2014 4.54  3.87 - 5.11 MIL/uL Final  . Hemoglobin 03/10/2014 12.6  12.0 - 15.0 g/dL Final  . HCT 03/10/2014 37.4  36.0 - 46.0 % Final  . MCV 03/10/2014 82.4  78.0 - 100.0 fL Final  . MCH 03/10/2014 27.8  26.0 - 34.0 pg Final  . MCHC 03/10/2014 33.7  30.0 - 36.0 g/dL Final  . RDW 03/10/2014 14.9  11.5 - 15.5 % Final  . Platelets 03/10/2014 182  150 - 400 K/uL Final  . Neutrophils Relative % 03/10/2014 73  43 - 77 % Final  . Neutro Abs 03/10/2014 6.1  1.7 - 7.7 K/uL Final  . Lymphocytes Relative 03/10/2014 18  12 - 46 % Final  . Lymphs Abs 03/10/2014 1.4  0.7 - 4.0 K/uL Final  . Monocytes Relative 03/10/2014 7  3 - 12 % Final  . Monocytes Absolute 03/10/2014 0.5  0.1 - 1.0 K/uL Final  . Eosinophils Relative 03/10/2014 2  0 - 5 % Final  . Eosinophils Absolute 03/10/2014 0.1  0.0 - 0.7 K/uL Final  . Basophils Relative 03/10/2014 0  0 - 1 % Final  . Basophils Absolute 03/10/2014 0.0  0.0 - 0.1 K/uL Final  . Sodium 03/10/2014 141  137 - 147 mEq/L Final  . Potassium 03/10/2014 3.6* 3.7 - 5.3 mEq/L Final  . Chloride 03/10/2014 103  96 - 112 mEq/L Final  . CO2 03/10/2014 26  19 - 32 mEq/L Final  . Glucose, Bld 03/10/2014 137* 70 - 99 mg/dL Final  . BUN 03/10/2014 17  6 - 23 mg/dL Final  . Creatinine, Ser 03/10/2014 0.81  0.50 - 1.10 mg/dL Final  . Calcium 03/10/2014 9.1  8.4 - 10.5 mg/dL Final  . Total Protein 03/10/2014 7.9  6.0 - 8.3 g/dL  Final  . Albumin 03/10/2014 3.8  3.5 - 5.2 g/dL Final  . AST 03/10/2014 24  0 - 37 U/L Final  . ALT 03/10/2014 24  0 - 35 U/L Final  . Alkaline Phosphatase 03/10/2014 167* 39 - 117 U/L Final  . Total Bilirubin 03/10/2014 0.7  0.3 - 1.2 mg/dL Final  . GFR calc non Af Amer 03/10/2014 65* >90 mL/min Final  . GFR calc Af Amer 03/10/2014 76* >90 mL/min Final   Comment: (NOTE)                          The eGFR has been  calculated using the CKD EPI equation.                          This calculation has not been validated in all clinical situations.                          eGFR's persistently <90 mL/min signify possible Chronic Kidney                          Disease.  . Anion gap 03/10/2014 12  5 - 15 Final  . CEA 03/10/2014 0.6  0.0 - 5.0 ng/mL Final   Performed at [facility]  . CA 19-9 03/10/2014 153.1* <35.0 U/mL Final   Performed at [facility] Outpatient Visit on 03/09/2014  Component Date Value Ref Range Status  . aPTT 03/09/2014 33  24 - 37 seconds Final  . WBC 03/09/2014 6.9  4.0 - 10.5 K/uL Final  . RBC 03/09/2014 4.83  3.87 - 5.11 MIL/uL Final  . Hemoglobin 03/09/2014 13.4  12.0 - 15.0 g/dL Final  . HCT 03/09/2014 39.4  36.0 - 46.0 % Final  . MCV 03/09/2014 81.6  78.0 - 100.0 fL Final  . MCH 03/09/2014 27.7  26.0 - 34.0 pg Final  . MCHC 03/09/2014 34.0  30.0 - 36.0 g/dL Final  . RDW 03/09/2014 14.7  11.5 - 15.5 % Final  . Platelets 03/09/2014 187  150 - 400 K/uL Final  . Neutrophils Relative % 03/09/2014 70  43 - 77 % Final  . Neutro Abs 03/09/2014 4.8  1.7 - 7.7 K/uL Final  . Lymphocytes Relative 03/09/2014 21  12 - 46 % Final  . Lymphs Abs 03/09/2014 1.5  0.7 - 4.0 K/uL Final  . Monocytes Relative 03/09/2014 7  3 - 12 % Final  . Monocytes Absolute 03/09/2014 0.5  0.1 - 1.0 K/uL Final  . Eosinophils Relative 03/09/2014 2  0 - 5 % Final  . Eosinophils Absolute 03/09/2014 0.1  0.0 - 0.7 K/uL Final  . Basophils Relative 03/09/2014 0  0 - 1 % Final   . Basophils Absolute 03/09/2014 0.0  0.0 - 0.1 K/uL Final  . Sodium 03/09/2014 142  137 - 147 mEq/L Final  . Potassium 03/09/2014 4.1  3.7 - 5.3 mEq/L Final  . Chloride 03/09/2014 104  96 - 112 mEq/L Final  . CO2 03/09/2014 25  19 - 32 mEq/L Final  . Glucose, Bld 03/09/2014 106* 70 - 99 mg/dL Final  . BUN 03/09/2014 22  6 - 23 mg/dL Final  . Creatinine, Ser 03/09/2014 0.94  0.50 - 1.10 mg/dL Final  . Calcium 03/09/2014 9.4  8.4 - 10.5 mg/dL Final  . Total Protein 03/09/2014 8.0  6.0 - 8.3 g/dL Final  . Albumin 03/09/2014 4.0  3.5 - 5.2 g/dL Final  . AST 03/09/2014 22  0 - 37 U/L Final  . ALT 03/09/2014 23  0 - 35 U/L Final  . Alkaline Phosphatase 03/09/2014 167* 39 - 117 U/L Final  . Total Bilirubin 03/09/2014 0.9  0.3 - 1.2 mg/dL Final  . GFR calc non Af Amer 03/09/2014 55* >90 mL/min Final  . GFR calc Af Amer 03/09/2014 63* >90 mL/min Final   Comment: (NOTE)                          The  eGFR has been calculated using the CKD EPI equation.                          This calculation has not been validated in all clinical situations.                          eGFR's persistently <90 mL/min signify possible Chronic Kidney                          Disease.  . Anion gap 03/09/2014 13  5 - 15 Final  . Prothrombin Time 03/09/2014 14.2  11.6 - 15.2 seconds Final  . INR 03/09/2014 1.09  0.00 - 1.49 Final    PATHOLOGY:adenocarcinoma  Urinalysis    Component Value Date/Time   BILIRUBINUR negative 07/21/2011 1020   PROTEINUR negative 07/21/2011 1020   UROBILINOGEN 0.2 07/21/2011 1020   NITRITE negative 07/21/2011 1020   LEUKOCYTESUR small (1+) 07/21/2011 1020    RADIOGRAPHIC STUDIES: Mr Liver W Wo Contrast  March 07, 2014   CLINICAL DATA:  Followup abnormal ultrasound.  EXAM: MRI ABDOMEN WITHOUT AND WITH CONTRAST  TECHNIQUE: Multiplanar multisequence MR imaging of the abdomen was performed both before and after the administration of intravenous contrast.  CONTRAST:  98m MULTIHANCE  GADOBENATE DIMEGLUMINE 529 MG/ML IV SOLN  COMPARISON:  02/22/2014  FINDINGS: Lower chest:  No pleural effusion.  Lung bases appear clear.  Hepatobiliary: There are 2 suspicious lesions identified within the right hepatic lobe. Lesion within segment 8 of the liver measures 2.8 cm, image 19/series 2. The second lesion within segment 7 of the liver measures 1.8 cm, image 21/series 12. A possible third lesion, also within segment 8 of the liver measures 1 cm, image 25/series 12.  Pancreas: There is a mass within the tail of pancreas measuring 5.4 x 3.0 x 2.6 cm. The mass is mostly solid with some cystic component. There is relative hypoenhancement of this mass. The mass appears to extend into the splenic hilum. There is no involvement of the celiac or SMA. The portal venous confluence and portal vein appear normal. There appears to be occlusion of the splenic vein at the level of the tail of pancreas.  Spleen: Pancreatic mass extends into the splenic hilum.  Adrenals/Urinary Tract: Normal appearance of the adrenal glands. There are bilateral renal cysts identified. Proteinaceous stress set hemorrhagic cyst within the left mid kidney is noted without enhancement measuring 9 mm.  Stomach/Bowel: The stomach and the proximal small bowel loops are unremarkable. Visualized portions of the colon are unremarkable.  Vascular/Lymphatic: Normal caliber of the abdominal aorta. There is a peripancreatic node which measures 1.5 cm, image number 36 of series 5005. No periaortic or aortocaval adenopathy. No mesenteric adenopathy.  Other: No ascites is identified within the upper abdomen. No peritoneal nodularity noted.  Musculoskeletal: Signal from within the bone marrow appears within normal limits.  IMPRESSION: 1. Mass within the tail of pancreas is identified and is concerning for primary pancreatic carcinoma. Likely adenocarcinoma. Evidence of splenic vein involvement as well as extension into the splenic hilum. 2. Multifocal  metastasis within the right hepatic lobe.   Electronically Signed   By: TKerby MoorsM.D.   On: [id number]   [place]  03/09/2014   CLINICAL DATA:  80year old female with newly diagnosed mass in the pancreatic tail and multiple hypoechoic liver lesions concerning for hepatic metastatic disease. Ultrasound-guided core  biopsy is warranted to facilitate tissue diagnosis.  EXAM: ULTRASOUND BIOPSY CORE LIVER  Date: 03/09/2014  PROCEDURE: 1. Ultrasound-guided core biopsy of liver lesion Interventional Radiologist:  Criselda Peaches, MD  ANESTHESIA/SEDATION: Moderate (conscious) sedation was used. To mg Versed, 50 mcg Fentanyl were administered intravenously. The patient's vital signs were monitored continuously by radiology nursing throughout the procedure.  Sedation Time: 13 minutes  TECHNIQUE: Informed consent was obtained from the patient following explanation of the procedure, risks, benefits and alternatives. The patient understands, agrees and consents for the procedure. All questions were addressed. A time out was performed.  The right upper quadrant was interrogated with ultrasound. A 1 cm hypoechoic nodule was identified in the left hepatic lobe (segment 4B) adjacent to the gallbladder fossa.  A suitable skin entry site was selected and marked. The region was then sterilely prepped and draped in the standard fashion using Betadine skin prep. Local anesthesia was attained by infiltration with 1% lidocaine. A small dermatotomy was made. Under real-time sonographic guidance, a 17 gauge introducer needle was advanced through the liver and to the margin of the lesion. Multiple 18 gauge core biopsies were then coaxially obtained using the BioPince automated biopsy device. Biopsy specimens were placed in formalin and delivered to pathology for further analysis.  As the introducer needle was removed, the biopsy tract was embolized with a Gel-Foam slurry. Post biopsy ultrasound imaging demonstrates no  perihepatic hematoma or evidence of active hemorrhage. The patient tolerated the procedure well.  COMPLICATIONS: None immediate  IMPRESSION: Technically successful ultrasound-guided core biopsy of a 1 cm lesion in hepatic segment 4B.  Signed,  Criselda Peaches, MD  [facility]  [facility]   Electronically Signed   By: Jacqulynn Cadet M.D.   On: 03/09/2014 17:06   Ir Fluoro Guide Cv Line Right  03/15/2014   INDICATION: History of metastatic pancreatic cancer. In need of intravenous access for chemotherapy administration.  EXAM: IMPLANTED PORT A CATH PLACEMENT WITH ULTRASOUND AND FLUOROSCOPIC GUIDANCE  COMPARISON:  None.  MEDICATIONS: Vancomycin 1.5 mg IV; The antibiotic was administered within an appropriate time interval prior to skin puncture.  ANESTHESIA/SEDATION: Versed 2 mg IV; Fentanyl 75 mcg IV;  Total Moderate Sedation Time  20  minutes.  CONTRAST:  None  FLUOROSCOPY TIME:  18 seconds; 4 mGy.  COMPLICATIONS: None immediate  PROCEDURE: The procedure, risks, benefits, and alternatives were explained to the patient. Questions regarding the procedure were encouraged and answered. The patient understands and consents to the procedure.  The right neck and chest were prepped with chlorhexidine in a sterile fashion, and a sterile drape was applied covering the operative field. Maximum barrier sterile technique with sterile gowns and gloves were used for the procedure. A timeout was performed prior to the initiation of the procedure. Local anesthesia was provided with 1% lidocaine with epinephrine.  After creating a small venotomy incision, a micropuncture kit was utilized to access the internal jugular vein under direct, real-time ultrasound guidance. Ultrasound image documentation was performed. The microwire was kinked to measure appropriate catheter length.  A subcutaneous port pocket was then created along the upper chest wall utilizing a combination of  sharp and blunt dissection. The pocket was irrigated with sterile saline. A single lumen ISP power injectable port was chosen for placement. The 8 Fr catheter was tunneled from the port pocket site to the venotomy incision. The port was placed in the pocket. The external catheter was trimmed to appropriate length. At the venotomy,  an 8 Fr peel-away sheath was placed over a guidewire under fluoroscopic guidance. The catheter was then placed through the sheath and the sheath was removed. Final catheter positioning was confirmed and documented with a fluoroscopic spot radiograph. The port was accessed with a Huber needle, aspirated and flushed with heparinized saline.  The venotomy site was closed with an interrupted 4-0 Vicryl suture. The port pocket incision was closed with interrupted 2-0 Vicryl suture and the skin was opposed with a running subcuticular 4-0 Vicryl suture. Dermabond and Steri-strips were applied to both incisions. Dressings were placed. The patient tolerated the procedure well without immediate post procedural complication.  FINDINGS: After catheter placement, the tip lies within the superior cavoatrial junction. The catheter aspirates and flushes normally and is ready for immediate use.  IMPRESSION: Successful placement of a right internal jugular approach power injectable Port-A-Cath. The catheter is ready for immediate use.   Electronically Signed   By: Sandi Mariscal M.D.   On: 03/15/2014 15:44   Ir US Guide Vasc Access Right  03/15/2014   INDICATION: History of metastatic pancreatic cancer. In need of intravenous access for chemotherapy administration.  EXAM: IMPLANTED PORT A CATH PLACEMENT WITH ULTRASOUND AND FLUOROSCOPIC GUIDANCE  COMPARISON:  None.  MEDICATIONS: Vancomycin 1.5 mg IV; The antibiotic was administered within an appropriate time interval prior to skin puncture.  ANESTHESIA/SEDATION: Versed 2 mg IV; Fentanyl 75 mcg IV;  Total Moderate Sedation Time  20  minutes.  CONTRAST:  None   FLUOROSCOPY TIME:  18 seconds; 4 mGy.  COMPLICATIONS: None immediate  PROCEDURE: The procedure, risks, benefits, and alternatives were explained to the patient. Questions regarding the procedure were encouraged and answered. The patient understands and consents to the procedure.  The right neck and chest were prepped with chlorhexidine in a sterile fashion, and a sterile drape was applied covering the operative field. Maximum barrier sterile technique with sterile gowns and gloves were used for the procedure. A timeout was performed prior to the initiation of the procedure. Local anesthesia was provided with 1% lidocaine with epinephrine.  After creating a small venotomy incision, a micropuncture kit was utilized to access the internal jugular vein under direct, real-time ultrasound guidance. Ultrasound image documentation was performed. The microwire was kinked to measure appropriate catheter length.  A subcutaneous port pocket was then created along the upper chest wall utilizing a combination of sharp and blunt dissection. The pocket was irrigated with sterile saline. A single lumen ISP power injectable port was chosen for placement. The 8 Fr catheter was tunneled from the port pocket site to the venotomy incision. The port was placed in the pocket. The external catheter was trimmed to appropriate length. At the venotomy, an 8 Fr peel-away sheath was placed over a guidewire under fluoroscopic guidance. The catheter was then placed through the sheath and the sheath was removed. Final catheter positioning was confirmed and documented with a fluoroscopic spot radiograph. The port was accessed with a Huber needle, aspirated and flushed with heparinized saline.  The venotomy site was closed with an interrupted 4-0 Vicryl suture. The port pocket incision was closed with interrupted 2-0 Vicryl suture and the skin was opposed with a running subcuticular 4-0 Vicryl suture. Dermabond and Steri-strips were applied to both  incisions. Dressings were placed. The patient tolerated the procedure well without immediate post procedural complication.  FINDINGS: After catheter placement, the tip lies within the superior cavoatrial junction. The catheter aspirates and flushes normally and is ready for immediate use.  IMPRESSION:  Successful placement of a right internal jugular approach power injectable Port-A-Cath. The catheter is ready for immediate use.   Electronically Signed   By: Sandi Mariscal M.D.   On: 03/15/2014 15:44   US Abdomen Limited Ruq  02/22/2014   CLINICAL DATA:  Elevated liver function tests. Altered stool habits. Right upper quadrant abdominal pain.  EXAM: US ABDOMEN LIMITED - RIGHT UPPER QUADRANT  COMPARISON:  None.  FINDINGS: Gallbladder:  Absent  Common bile duct:  Diameter: 10 mm.  Liver:  Intrahepatic biliary dilatation, mild. Right hepatic lobe lesion measuring 2.3 by 2.4 by 2.4 cm and has central echogenicity similar to the liver but a 4 mm hypoechoic rim on image 35, without definite enhance through transmission. Similar lesion along the gallbladder fossa, 9 mm in diameter on image 48.  IMPRESSION: 1. Two target-like lesions in the liver, 1 in the right hepatic lobe in 1 near the gallbladder fossa. These are nonspecific and malignancy can have this appearance. Hepatic protocol MRI with and without contrast is recommended for further characterization. 2. Absent gallbladder. 10 mm in diameter common bile duct and mild intrahepatic biliary dilatation may simply be physiologic. These results will be called to the ordering clinician or representative by the Radiologist Assistant, and communication documented in the PACS or zVision Dashboard.   Electronically Signed   By: Sherryl Barters M.D.   On: 02/22/2014 10:58    ASSESSMENT:  #1. 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 #2. Hypertension, controlled. #3. Diarrhea controlled with probiotic, pantoprazole, and  cholestyramine light.   PLAN:  #1. Cycle 1, day 1 of Abraxane/Gemzar.  #2. Patient was told to call if she developed persistent nausea or fever. #3. Return in one week for cycle 1 day 8, CBC, office visit   All questions were answered. The patient knows to call the clinic with any problems, questions or concerns. We can certainly see the patient much sooner if necessary.   I spent 25 minutes counseling the patient face to face. The total time spent in the appointment was 30 minutes.    Doroteo Bradford, MD 03/22/2014 12:01 PM  DISCLAIMER:  This note was dictated with voice recognition software.  Similar sounding words can inadvertently be transcribed inaccurately and may not be corrected upon review.

## 2014-03-22 NOTE — Patient Instructions (Addendum)
[facility] Discharge Instructions  RECOMMENDATIONS MADE BY THE CONSULTANT AND ANY TEST RESULTS WILL BE SENT TO YOUR REFERRING PHYSICIAN.  Cycle 1 day 1 of Abraxane/Gemzar.   Patient was told to call if she developed persistent nausea or fever.  Return in one week for cycle 1 day 8, CBC, office visit  Thank you for choosing [facility] to provide your oncology and hematology care.  To afford each patient quality time with our providers, please arrive at least 15 minutes before your scheduled appointment time.  With your help, our goal is to use those 15 minutes to complete the necessary work-up to ensure our physicians have the information they need to help with your evaluation and healthcare recommendations.    Effective January 1st, 2014, we ask that you re-schedule your appointment with our physicians should you arrive 10 or more minutes late for your appointment.  We strive to give you quality time with our providers, and arriving late affects you and other patients whose appointments are after yours.    Again, thank you for choosing [facility].  Our hope is that these requests will decrease the amount of time that you wait before being seen by our physicians.       _____________________________________________________________  Should you have questions after your visit to [facility], please contact our office at [phone] between the hours of 8:30 a.m. and 4:30 p.m.  Voicemails left after 4:30 p.m. will not be returned until the following business day.  For prescription refill requests, have your pharmacy contact our office with your prescription refill request.    _______________________________________________________________  We hope that we have given you very good care.  You may receive a patient satisfaction survey in the mail, please complete it and return it as soon as possible.  We value your  feedback!  _______________________________________________________________  Have you asked about our STAR program?  STAR stands for Survivorship Training and Rehabilitation, and this is a nationally recognized cancer care program that focuses on survivorship and rehabilitation.  Cancer and cancer treatments may cause problems, such as, pain, making you feel tired and keeping you from doing the things that you need or want to do. Cancer rehabilitation can help. Our goal is to reduce these troubling effects and help you have the best quality of life possible.  You may receive a survey from a nurse that asks questions about your current state of health.  Based on the survey results, all eligible patients will be referred to the [facility] program for an evaluation so we can better serve you!  A frequently asked questions sheet is available upon request. What to Know After Chemo What am I supposed to do? . Live your Life!!!!! Continue to do your normal routine or activities of daily living.  . You may feel more tired than usual as your treatments progress, expect that and plan for it. What should I eat? . Fresh fruits and vegetables that have been thoroughly cleaned but NO grapefruit. . Thoroughly cook your food - no hot pink center or pink/red juices. . No raw or undercooked eggs/meat. . Expect that your taste buds will change. Foods/drinks are going to taste different.  [name] has Dieticians available to help with meal ideas and planning. What do I do if I feel sick to my stomach or throw up?  Marland Kitchen Feeling sick to your stomach is called feeling "nauseated."  You will see "  nausea" written on your prescriptions and discharge instructions. . Take your nausea medications as directed on bottle at the 1st sign of feeling sick.  DO NOT WAIT. . If you throw up more than once, contact the [place].   . If it is the weekend, please go to the nearest Emergency Room or Urgent [facility]. What  do I do if I cannot have a bowel movement (constipated) or have diarrhea?  . If you can't have a bowel movement (constipated), these medications can be bought over-the-counter at drug stores or [employer].  o Colace (docusate sodium) 100mg  capsule. May take 1-6 capsules everyday as needed to soften stool. o Senna-S or Senokot (laxative). May take 1-4 tablets daily as needed for mild constipation.  o Milk of Magnesia.  (laxative for moderate - severe constipation) You may take up to 8 tablespoons in a 24 hour period. Start with 1 tablespoon (34ml) of Milk of Magnesia. If no BM within 6-8 hours, take 2 tablespoons (76ml). If no BM, increase to 3 tablespoons (95ml). If no BM, contact the [place].  o If your bowels get loose (diarrhea) take Imodium. Take Imodium 2 tablets after the 1st loose stool, and then 1 tablet every 2 hours until 12 hours have passed without a bowel movement. May take 2 tablets @ bedtime and every 4 hours until morning. If diarrhea recurs repeat. o If the Imodium does not slow down or stop your diarrhea - call the [facility].  Do not let this continue for more than 24 hours. Diarrhea/loose stools can lead to dehydration.    What do I do if I get fever and/or chills? . Check your temperature daily to monitor for fever or infection. . If temperature is greater than or equal to 100.5 or you are experiencing chills call the [facility]. If the [facility] is closed, report to the nearest emergency room or urgent care. NEVER IGNORE A FEVER! THIS IS FOR YOUR SAFETY! What do I do if I get mouth sores or have pain in my mouth? . Call the [facility] and speak to a nurse. . If it is the weekend or after hours, rinse your mouth with salt water swishes, gargles, and spits at least 3 times a day - then call us Monday morning.   . DO NOT eat citrus or spicy foods, DO NOT rinse your mouth with alcohol based mouthwashes, & DO NOT ignore mouth pain.   What do I do if I get green or  yellow mucus production or a congested cough? . Call the [facility] and speak to a nurse.  . If it is after hours or the weekend and these symptoms are making it hard for you to breathe, and/or you also have a fever --- go to the emergency room or an urgent care center. What do I do if I begin burning when I urinate? . Call the [facility] and speak to a nurse. If it is after hours or the weekend and along with these symptoms you also have a fever, go to the emergency room or urgent care center. (Caregivers of patients receiving chemo --- In older patients, a sign of a urinary tract infection can be confusion and not necessarily a fever.) What do I do if I have shortness of breath? . You may feel tired or feel like you get "winded" more easily than usual after your chemotherapy treatments.  You should never feel like you cannot breathe. . If you are  panting, lips turning blue or purple, feeling dizzy or light headed, this is NOT normal. You need to get medical help immediately.  Dial 911.  This information is intended to be a supplement to the in depth review that you had during chemotherapy teaching with our Nurse Navigator.  As always, if you have questions call the [place] and speak with a nurse.  [place] Phone Numbers: Hours of Operation: Monday - Friday 8:30-4:30, Closed for [place] Number:  [phone]   Nurse Navigator:  [phone]   [place]: [phone]

## 2014-03-23 ENCOUNTER — Telehealth (HOSPITAL_COMMUNITY): Payer: Self-pay | Admitting: *Deleted

## 2014-03-23 LAB — CANCER ANTIGEN 19-9: CA 19-9: 152.4 U/mL — ABNORMAL HIGH (ref <35.0)

## 2014-03-23 NOTE — Telephone Encounter (Signed)
24h follow up: Patient is doing very good today. She has no complaints. Patient to call me tomorrow to let me know how she is doing prior to the weekend.

## 2014-03-24 ENCOUNTER — Telehealth (HOSPITAL_COMMUNITY): Payer: Self-pay | Admitting: *Deleted

## 2014-03-24 NOTE — Telephone Encounter (Signed)
Patient called to let me know that she is having constipation. I instructed her to take Senokot 1 tablet and if no BM within 8 hours to take another Senokot tablet. If no BM, take Milk of Magnesia as ordered on the discharge papers I gave her. She verbalized understanding. She did say that her hips were hurting as if she had gotten a shot in them. I told her that was probably coming from the chemo that she took since it can cause muscle/join aches and flu like symptoms. She is going to try taking an ibuprofen to see if that improves that.

## 2014-03-26 NOTE — Progress Notes (Signed)
[name] Nakayama, MD [address] [place] [address]  Pancreatic cancer metastasized to liver  CURRENT THERAPY: Gemzar/Abraxane on days 1, 8, 15 every 28 days.  INTERVAL HISTORY: [name] 80 y.o. 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    Pancreatic cancer metastasized to liver   03/10/2014 Initial Diagnosis Pancreatic cancer metastasized to liver   03/22/2014 -  Chemotherapy Abraxane/Gemzar days 1, 8, 15 every 28 days   I personally reviewed and went over laboratory results with the patient.  The results are noted within this dictation.  I personally reviewed and went over radiographic studies with the patient.  The results are noted within this dictation.    I educated the patient regarding her malignancy, stage IV, and the incurability of treatment.  She is educated that treatment is palliative only.  Her sister was present as well.  She tolerated her first treatment well with only 1 episode of constipation.  She now take Senokot-S with great effectiveness and she notes that she take it everyday to keep her BMs regular.  Oncologically, she denies any complaints and ROS questioning is negative.  Past Medical History  Diagnosis Date  . Hypertension 20 years   . Chronic diarrhea   . Anxiety   . Depression   . Kidney stone     hx/ crushed   . Hx of adenomatous colonic polyps     tubular adenomas, last found in 2008  . GERD (gastroesophageal reflux disease)   . Erosive esophagitis   . Schatzki's ring 08/26/10    Last dilated on EGD by Dr. Trevor [facility], linear gastric erosions, BI hemigastrectomy  . Hyperplastic colon polyp 03/19/10    tcs by Dr. Gala Romney  . Pancreatic cancer 02/2014  . Pancreatic cancer metastasized to liver 03/10/2014    has OVERWEIGHT; Essential hypertension; OSTEOARTHROSIS, LOCAL, PRIMARY, SHOULDER; DEGENERATIVE JOINT DISEASE, CERVICAL SPINE;  RUPTURE ROTATOR CUFF; ALLERGIC RHINITIS, SEASONAL; TREMOR; GERD (gastroesophageal reflux disease); Chronic diarrhea; Hyperlipidemia; Anxiety; Primary generalized (osteo)arthritis; Back pain with radiation; Weakness of left leg; Difficulty in walking; Routine general medical examination at a health care facility; IGT (impaired glucose tolerance); Fracture of right ankle; Encounter for annual physical exam; Anemia, iron deficiency; Elevated LFTs; Change in stool habits; Back pain without sciatica; Abnormal liver ultrasound; Pancreatic cancer metastasized to liver; and Liver metastases on her problem list.     is allergic to aciphex; amlodipine besylate-valsartan; esomeprazole magnesium; omeprazole; penicillins; and ciprofloxacin.  Ms. Woodin had no medications administered during this visit.  Past Surgical History  Procedure Laterality Date  . Stomach ulcer  50 years ago     had some of her stomach removed   . Bunion removal      from both feet   . Left shoulder surgery  2009    DR HARRISON  . Colonoscopy  03/19/2010    DR Gala Romney,, normal TI, pancolonic diverticula, random colon bx neg., hyperplastic polyps removed  . Esophagogastroduodenoscopy  11/22/2003    DR Gala Romney, erosive RE, Billroth I  . Esophagogastroduodenoscopy  08/26/10    Dr. Gala Romney- moderate severe ERE, Scahtzki ring s/p dilation, Billroth I, linear gastric erosions, bx-gastric xanthelasma  . Breast lumpectomy Left   . Orif ankle fracture Right 05/22/2013    Procedure: OPEN REDUCTION INTERNAL FIXATION (ORIF) RIGHT ANKLE FRACTURE;  Surgeon: Sanjuana Kava, MD;  Location: [facility];  Service: Orthopedics;  Laterality: Right;  . Cholecystectomy  1965     Denies any headaches, dizziness, double vision, fevers, chills, night sweats, nausea, vomiting, diarrhea, chest pain, heart palpitations, shortness of breath, blood in stool, black tarry stool, urinary pain, urinary burning, urinary frequency, hematuria.   PHYSICAL EXAMINATION  ECOG  PERFORMANCE STATUS: 0 - Asymptomatic  Filed Vitals:   03/29/14 0920  BP: 149/60  Pulse: 53  Temp: 98 F (36.7 C)  Resp: 18    GENERAL:alert, no distress, well nourished, well developed, comfortable, cooperative and smiling SKIN: skin color, texture, turgor are normal, no rashes or significant lesions HEAD: Normocephalic, No masses, lesions, tenderness or abnormalities EYES: normal, PERRLA, EOMI, Conjunctiva are pink and non-injected EARS: External ears normal OROPHARYNX:mucous membranes are moist  NECK: supple, trachea midline LYMPH:  not examined BREAST:not examined LUNGS: not examined HEART: not examined ABDOMEN:not examined BACK: Back symmetric, no curvature. EXTREMITIES:no skin discoloration  NEURO: alert & oriented x 3 with fluent speech, no focal motor/sensory deficits   LABORATORY DATA: CBC    Component Value Date/Time   WBC 2.8* 03/29/2014 0945   RBC 4.01 03/29/2014 0945   HGB 11.4* 03/29/2014 0945   HCT 33.1* 03/29/2014 0945   PLT 119* 03/29/2014 0945   MCV 82.5 03/29/2014 0945   MCH 28.4 03/29/2014 0945   MCHC 34.4 03/29/2014 0945   RDW 14.6 03/29/2014 0945   LYMPHSABS 1.3 03/29/2014 0945   MONOABS 0.1 03/29/2014 0945   EOSABS 0.1 03/29/2014 0945   BASOSABS 0.0 03/29/2014 0945      Chemistry      Component Value Date/Time   NA 142 03/15/2014 1205   K 4.3 03/15/2014 1205   CL 105 03/15/2014 1205   CO2 25 03/15/2014 1205   BUN 18 03/15/2014 1205   CREATININE 0.78 03/15/2014 1205   CREATININE 0.97 02/14/2014 0758      Component Value Date/Time   CALCIUM 9.2 03/15/2014 1205   ALKPHOS 167* 03/10/2014 1600   AST 24 03/10/2014 1600   ALT 24 03/10/2014 1600   BILITOT 0.7 03/10/2014 1600     Results for BESAN, KETCHEM (MRN [id number]) as of 03/26/2014 11:36  Ref. Range 03/22/2014 10:58  CA 19-9 Latest Range: <35.0 U/mL 152.4 (H)      ASSESSMENT:  1. 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 2.  Hypertension, controlled. 3. Diarrhea controlled with probiotic, pantoprazole, and cholestyramine.  Patient Active Problem List   Diagnosis Date Noted  . Pancreatic cancer metastasized to liver 03/10/2014  . Liver metastases 03/10/2014  . Abnormal liver ultrasound 02/27/2014  . Elevated LFTs 02/16/2014  . Change in stool habits 02/16/2014  . Back pain without sciatica 02/16/2014  . Anemia, iron deficiency 11/03/2013  . Encounter for annual physical exam 10/16/2013  . Fracture of right ankle 05/22/2013  . IGT (impaired glucose tolerance) 02/19/2013  . Routine general medical examination at a health care facility 08/18/2012  . Weakness of left leg 05/27/2012  . Difficulty in walking 05/27/2012  . Back pain with radiation 05/24/2012  . Primary generalized (osteo)arthritis 05/12/2012  . Anxiety 09/15/2011  . Hyperlipidemia 07/14/2011  . Chronic diarrhea 10/02/2010  . GERD (gastroesophageal reflux disease) 08/20/2010  . ALLERGIC RHINITIS, SEASONAL 06/30/2010  . TREMOR 06/25/2010  . OVERWEIGHT 01/07/2010  . Essential hypertension 01/07/2010  . DEGENERATIVE JOINT DISEASE, CERVICAL SPINE 04/14/2007  . RUPTURE ROTATOR CUFF 03/01/2007  . OSTEOARTHROSIS, LOCAL, PRIMARY, SHOULDER 02/17/2007     PLAN:  1. I personally reviewed and went over laboratory results with the patient.  The results are noted within this dictation. 2. Pre-chemo labs as ordered 3. Day 8 of cycle 1 today as planned 4. Patient education regarding her chemotherapy and cycle length. 5. Patient education regarding her malignancy, stage, and prognosis. 6. Patient education regarding the palliative nature of treatment 7. Return in 1 weeks for day 15 of chemotherapy.  8. Return in 2 weeks for follow-up.   THERAPY PLAN:  We will continue with chemotherapy (Gemzar/Abraxane) as planned and treat for 2-3 cycles followed by restaging scans to evaluate response to therapy and discussion regarding future therapy options.    All questions were answered. The patient knows to call the clinic with any problems, questions or concerns. We can certainly see the patient much sooner if necessary.  Patient and plan discussed with Dr. Farrel Gobble and he is in agreement with the aforementioned.   Keeanna Villafranca 03/29/2014

## 2014-03-29 ENCOUNTER — Encounter (HOSPITAL_COMMUNITY): Payer: Self-pay

## 2014-03-29 ENCOUNTER — Encounter (HOSPITAL_BASED_OUTPATIENT_CLINIC_OR_DEPARTMENT_OTHER): Payer: Medicare Other

## 2014-03-29 ENCOUNTER — Encounter (HOSPITAL_BASED_OUTPATIENT_CLINIC_OR_DEPARTMENT_OTHER): Payer: Medicare Other | Admitting: Oncology

## 2014-03-29 VITALS — BP 149/60 | HR 53 | Temp 98.0°F | Resp 18 | Wt 158.8 lb

## 2014-03-29 DIAGNOSIS — C787 Secondary malignant neoplasm of liver and intrahepatic bile duct: Secondary | ICD-10-CM | POA: Diagnosis not present

## 2014-03-29 DIAGNOSIS — C252 Malignant neoplasm of tail of pancreas: Secondary | ICD-10-CM | POA: Diagnosis present

## 2014-03-29 DIAGNOSIS — R197 Diarrhea, unspecified: Secondary | ICD-10-CM

## 2014-03-29 DIAGNOSIS — I10 Essential (primary) hypertension: Secondary | ICD-10-CM | POA: Diagnosis not present

## 2014-03-29 DIAGNOSIS — Z5111 Encounter for antineoplastic chemotherapy: Secondary | ICD-10-CM | POA: Diagnosis not present

## 2014-03-29 DIAGNOSIS — C259 Malignant neoplasm of pancreas, unspecified: Secondary | ICD-10-CM

## 2014-03-29 LAB — CBC WITH DIFFERENTIAL/PLATELET
Basophils Absolute: 0 10*3/uL (ref 0.0–0.1)
Basophils Relative: 0 % (ref 0–1)
EOS ABS: 0.1 10*3/uL (ref 0.0–0.7)
Eosinophils Relative: 2 % (ref 0–5)
HCT: 33.1 % — ABNORMAL LOW (ref 36.0–46.0)
HEMOGLOBIN: 11.4 g/dL — AB (ref 12.0–15.0)
LYMPHS ABS: 1.3 10*3/uL (ref 0.7–4.0)
LYMPHS PCT: 47 % — AB (ref 12–46)
MCH: 28.4 pg (ref 26.0–34.0)
MCHC: 34.4 g/dL (ref 30.0–36.0)
MCV: 82.5 fL (ref 78.0–100.0)
MONOS PCT: 3 % (ref 3–12)
Monocytes Absolute: 0.1 10*3/uL (ref 0.1–1.0)
NEUTROS PCT: 48 % (ref 43–77)
Neutro Abs: 1.4 10*3/uL — ABNORMAL LOW (ref 1.7–7.7)
Platelets: 119 10*3/uL — ABNORMAL LOW (ref 150–400)
RBC: 4.01 MIL/uL (ref 3.87–5.11)
RDW: 14.6 % (ref 11.5–15.5)
WBC: 2.8 10*3/uL — AB (ref 4.0–10.5)

## 2014-03-29 MED ORDER — SODIUM CHLORIDE 0.9 % IJ SOLN: 10.0000 mL | INTRAMUSCULAR | Status: DC | PRN

## 2014-03-29 MED ORDER — DEXAMETHASONE SODIUM PHOSPHATE 10 MG/ML IJ SOLN: 10.0000 mg | Freq: Once | INTRAMUSCULAR | Status: DC

## 2014-03-29 MED ORDER — SODIUM CHLORIDE 0.9 % IV SOLN
Freq: Once | INTRAVENOUS | Status: AC
  Administered 2014-03-29: 10:00:00 via INTRAVENOUS

## 2014-03-29 MED ORDER — PACLITAXEL PROTEIN-BOUND CHEMO INJECTION 100 MG
125.0000 mg/m2 | Freq: Once | INTRAVENOUS | Status: AC
  Administered 2014-03-29: 225 mg via INTRAVENOUS
  Filled 2014-03-29: qty 45

## 2014-03-29 MED ORDER — HEPARIN SOD (PORK) LOCK FLUSH 100 UNIT/ML IV SOLN
500.0000 [IU] | Freq: Once | INTRAVENOUS | Status: AC | PRN
  Administered 2014-03-29: 500 [IU]
  Filled 2014-03-29: qty 5

## 2014-03-29 MED ORDER — SODIUM CHLORIDE 0.9 % IV SOLN
Freq: Once | INTRAVENOUS | Status: AC
  Administered 2014-03-29: 8 mg via INTRAVENOUS
  Filled 2014-03-29: qty 4

## 2014-03-29 MED ORDER — SODIUM CHLORIDE 0.9 % IV SOLN
1000.0000 mg/m2 | Freq: Once | INTRAVENOUS | Status: AC
  Administered 2014-03-29: 1786 mg via INTRAVENOUS
  Filled 2014-03-29: qty 46.97

## 2014-03-29 MED ORDER — SODIUM CHLORIDE 0.9 % IV SOLN: 8.0000 mg | Freq: Once | INTRAVENOUS | Status: DC

## 2014-03-29 NOTE — Progress Notes (Signed)
Patient tolerated chemotherapy well.  

## 2014-03-29 NOTE — Patient Instructions (Signed)
[facility] Discharge Instructions  RECOMMENDATIONS MADE BY THE CONSULTANT AND ANY TEST RESULTS WILL BE SENT TO YOUR REFERRING PHYSICIAN.  We will continue chemotherapy as planned.  I reviewed your MRI images with you and illustrated the pancreatic lesion and liver metastases. I informed you that you have Stage IV pancreatic cancer.  This cancer is incurable, but treatable.  Chemotherapy is palliative in nature in the sense that the goal of therapy is to maintain you present quality of life and hopefully shrink your tumor size and prolong your life expectancy.  Treatment will not cure your cancer. Please call the clinic with any questions or concerns regarding your malignancy.  Please call the clinic or report to ED with worsening pain, nausea, vomiting, yellowing of skin, yellowing of eyes, diarrhea, constipation, fevers, shaking chills, etc.  Thank you for choosing [facility] to provide your oncology and hematology care.  To afford each patient quality time with our providers, please arrive at least 15 minutes before your scheduled appointment time.  With your help, our goal is to use those 15 minutes to complete the necessary work-up to ensure our physicians have the information they need to help with your evaluation and healthcare recommendations.    Effective January 1st, 2014, we ask that you re-schedule your appointment with our physicians should you arrive 10 or more minutes late for your appointment.  We strive to give you quality time with our providers, and arriving late affects you and other patients whose appointments are after yours.    Again, thank you for choosing [facility].  Our hope is that these requests will decrease the amount of time that you wait before being seen by our physicians.       _____________________________________________________________  Should you have questions after your visit to [facility],  please contact our office at [phone] between the hours of 8:30 a.m. and 5:00 p.m.  Voicemails left after 4:30 p.m. will not be returned until the following business day.  For prescription refill requests, have your pharmacy contact our office with your prescription refill request.

## 2014-03-29 NOTE — Patient Instructions (Addendum)
[facility] Discharge Instructions for Patients Receiving Chemotherapy  Today you received the following chemotherapy agents Abraxane and Gemzar.  Please follow up as scheduled.  Please call for any questions or concerns.    If you develop nausea and vomiting, or diarrhea that is not controlled by your medication, call the clinic.   The clinic phone number is [phone]. Office hours are Monday-Friday 8:30am-5:00pm.  BELOW ARE SYMPTOMS THAT SHOULD BE REPORTED IMMEDIATELY:  *FEVER GREATER THAN 101.0 F  *CHILLS WITH OR WITHOUT FEVER  NAUSEA AND VOMITING THAT IS NOT CONTROLLED WITH YOUR NAUSEA MEDICATION  *UNUSUAL SHORTNESS OF BREATH  *UNUSUAL BRUISING OR BLEEDING  TENDERNESS IN MOUTH AND THROAT WITH OR WITHOUT PRESENCE OF ULCERS  *URINARY PROBLEMS  *BOWEL PROBLEMS  UNUSUAL RASH Items with * indicate a potential emergency and should be followed up as soon as possible. If you have an emergency after office hours please contact your primary care physician or go to the nearest emergency department.  Please call the clinic during office hours if you have any questions or concerns.   You may also contact the Patient Navigator at [phone] should you have any questions or need assistance in obtaining follow up care. _____________________________________________________________________ Have you asked about our STAR program?    STAR stands for Survivorship Training and Rehabilitation, and this is a nationally recognized cancer care program that focuses on survivorship and rehabilitation.  Cancer and cancer treatments may cause problems, such as, pain, making you feel tired and keeping you from doing the things that you need or want to do. Cancer rehabilitation can help. Our goal is to reduce these troubling effects and help you have the best quality of life possible.  You may receive a survey from a nurse that asks questions about your current state of health.   Based on the survey results, all eligible patients will be referred to the [facility] program for an evaluation so we can better serve you! A frequently asked questions sheet is available upon request.

## 2014-03-29 NOTE — Progress Notes (Signed)
Notified MD of current [place], wanted Pt treated today. Will recheck levels next week.

## 2014-03-29 NOTE — Progress Notes (Signed)
Tolerated infusion well. 

## 2014-03-31 ENCOUNTER — Telehealth (HOSPITAL_COMMUNITY): Payer: Self-pay

## 2014-03-31 NOTE — Telephone Encounter (Signed)
She commented on this during our last visit.  She reported it was in her left shoulder.  I think it is arthritic.  Agree with continued pain management.  May also be referred pain from Liver mets.  [name] 03/31/2014

## 2014-03-31 NOTE — Telephone Encounter (Signed)
Call from patient with complaints of pain in her left arm.  States that it's been hurting off and on since last Friday.  Has been taking 2 aleve and some hydrocodone that she has at home.  Denies any swelling or redness in arm or fever.  States "both arms hurt from time to time but the left is worse than the right.  The aleve and hydrocodone is helping."  Encouraged to continue what she's doing and to go to ED if arm should swell, become red, if she develops fever or if pain worsens.  To call back on Monday with an update on pain.

## 2014-04-03 ENCOUNTER — Telehealth (HOSPITAL_COMMUNITY): Payer: Self-pay

## 2014-04-03 NOTE — Telephone Encounter (Signed)
Call from patient to let us know that left arm and shoulder pain has improved.

## 2014-04-05 ENCOUNTER — Encounter (HOSPITAL_BASED_OUTPATIENT_CLINIC_OR_DEPARTMENT_OTHER): Payer: Medicare Other | Admitting: Oncology

## 2014-04-05 ENCOUNTER — Encounter (HOSPITAL_COMMUNITY): Payer: Self-pay

## 2014-04-05 ENCOUNTER — Encounter (HOSPITAL_COMMUNITY): Payer: Medicare Other

## 2014-04-05 DIAGNOSIS — C259 Malignant neoplasm of pancreas, unspecified: Secondary | ICD-10-CM

## 2014-04-05 DIAGNOSIS — C787 Secondary malignant neoplasm of liver and intrahepatic bile duct: Secondary | ICD-10-CM | POA: Diagnosis not present

## 2014-04-05 DIAGNOSIS — C252 Malignant neoplasm of tail of pancreas: Secondary | ICD-10-CM

## 2014-04-05 LAB — CBC WITH DIFFERENTIAL/PLATELET
Basophils Absolute: 0 10*3/uL (ref 0.0–0.1)
Basophils Relative: 0 % (ref 0–1)
EOS PCT: 2 % (ref 0–5)
Eosinophils Absolute: 0 10*3/uL (ref 0.0–0.7)
HCT: 30.9 % — ABNORMAL LOW (ref 36.0–46.0)
Hemoglobin: 10.6 g/dL — ABNORMAL LOW (ref 12.0–15.0)
LYMPHS ABS: 1 10*3/uL (ref 0.7–4.0)
Lymphocytes Relative: 62 % — ABNORMAL HIGH (ref 12–46)
MCH: 28.3 pg (ref 26.0–34.0)
MCHC: 34.3 g/dL (ref 30.0–36.0)
MCV: 82.6 fL (ref 78.0–100.0)
MONO ABS: 0 10*3/uL — AB (ref 0.1–1.0)
Monocytes Relative: 2 % — ABNORMAL LOW (ref 3–12)
NEUTROS ABS: 0.5 10*3/uL — AB (ref 1.7–7.7)
Neutrophils Relative %: 34 % — ABNORMAL LOW (ref 43–77)
PLATELETS: 55 10*3/uL — AB (ref 150–400)
RBC: 3.74 MIL/uL — ABNORMAL LOW (ref 3.87–5.11)
RDW: 14.1 % (ref 11.5–15.5)
WBC: 1.5 10*3/uL — AB (ref 4.0–10.5)

## 2014-04-05 MED ORDER — SODIUM CHLORIDE 0.9 % IV SOLN
Freq: Once | INTRAVENOUS | Status: AC
  Administered 2014-04-05: 09:00:00 via INTRAVENOUS

## 2014-04-05 MED ORDER — HEPARIN SOD (PORK) LOCK FLUSH 100 UNIT/ML IV SOLN
INTRAVENOUS | Status: AC
  Filled 2014-04-05: qty 5

## 2014-04-05 MED ORDER — SODIUM CHLORIDE 0.9 % IJ SOLN: 10.0000 mL | INTRAMUSCULAR | Status: DC | PRN

## 2014-04-05 MED ORDER — HEPARIN SOD (PORK) LOCK FLUSH 100 UNIT/ML IV SOLN
500.0000 [IU] | Freq: Once | INTRAVENOUS | Status: AC | PRN
  Administered 2014-04-05: 500 [IU]

## 2014-04-05 NOTE — Patient Instructions (Signed)
[facility] Discharge Instructions  RECOMMENDATIONS MADE BY THE CONSULTANT AND ANY TEST RESULTS WILL BE SENT TO YOUR REFERRING PHYSICIAN.  Chemotherapy held today due to low blood counts. Return as scheduled for Cycle II day 1. Please contact us for any questions or concerns during normal business hours; otherwise, contact your family physician or report to the Emergency Department.  Thank you for choosing [facility] to provide your oncology and hematology care.  To afford each patient quality time with our providers, please arrive at least 15 minutes before your scheduled appointment time.  With your help, our goal is to use those 15 minutes to complete the necessary work-up to ensure our physicians have the information they need to help with your evaluation and healthcare recommendations.    Effective January 1st, 2014, we ask that you re-schedule your appointment with our physicians should you arrive 10 or more minutes late for your appointment.  We strive to give you quality time with our providers, and arriving late affects you and other patients whose appointments are after yours.    Again, thank you for choosing [facility].  Our hope is that these requests will decrease the amount of time that you wait before being seen by our physicians.       _____________________________________________________________  Should you have questions after your visit to [facility], please contact our office at [phone] between the hours of 8:30 a.m. and 4:30 p.m.  Voicemails left after 4:30 p.m. will not be returned until the following business day.  For prescription refill requests, have your pharmacy contact our office with your prescription refill request.    _______________________________________________________________  We hope that we have given you very good care.  You may receive a patient satisfaction survey in the mail, please  complete it and return it as soon as possible.  We value your feedback!  _______________________________________________________________  Have you asked about our STAR program?  STAR stands for Survivorship Training and Rehabilitation, and this is a nationally recognized cancer care program that focuses on survivorship and rehabilitation.  Cancer and cancer treatments may cause problems, such as, pain, making you feel tired and keeping you from doing the things that you need or want to do. Cancer rehabilitation can help. Our goal is to reduce these troubling effects and help you have the best quality of life possible.  You may receive a survey from a nurse that asks questions about your current state of health.  Based on the survey results, all eligible patients will be referred to the [facility] program for an evaluation so we can better serve you!  A frequently asked questions sheet is available upon request.

## 2014-04-05 NOTE — Progress Notes (Signed)
Brande is seen as a work-in today.  Her son is in from [place] for Thanksgiving and she wanted me to speak with him regarding her cancer and treatment.  I provided the patient's son, education regarding the patient's malignancy.  Ms. Yamaguchi has a stage IV pancreatic cancer  With metastatic disease to liver. She has 3 identifiable lesions in the liver. The patient requested I review her imaging scans with her son. I provided a computer and we looked at the MRI scan with the patient and her son. I was able to identify all 3 hepatic lesions along with the tail of the pancreas primary and illustrated these areas to everyone.    With regards to her treatment, she is getting Abraxane and Gemzar chemotherapy. We will restage her following her second or third complete cycle of chemotherapy.   The role of chemotherapy is palliative in nature. Depending on response, we'll make future treatment recommendations.   The patient's son is very appreciative of the information.   Willene Holian 04/05/2014

## 2014-04-05 NOTE — Patient Instructions (Signed)
[facility] Discharge Instructions  RECOMMENDATIONS MADE BY THE CONSULTANT AND ANY TEST RESULTS WILL BE SENT TO YOUR REFERRING PHYSICIAN.   Return as scheduled for follow-up and continuation of chemotherapy.  Thank you for choosing [facility] to provide your oncology and hematology care.  To afford each patient quality time with our providers, please arrive at least 15 minutes before your scheduled appointment time.  With your help, our goal is to use those 15 minutes to complete the necessary work-up to ensure our physicians have the information they need to help with your evaluation and healthcare recommendations.    Effective January 1st, 2014, we ask that you re-schedule your appointment with our physicians should you arrive 10 or more minutes late for your appointment.  We strive to give you quality time with our providers, and arriving late affects you and other patients whose appointments are after yours.    Again, thank you for choosing [facility].  Our hope is that these requests will decrease the amount of time that you wait before being seen by our physicians.       _____________________________________________________________  Should you have questions after your visit to [facility], please contact our office at [phone] between the hours of 8:30 a.m. and 5:00 p.m.  Voicemails left after 4:30 p.m. will not be returned until the following business day.  For prescription refill requests, have your pharmacy contact our office with your prescription refill request.

## 2014-04-13 ENCOUNTER — Encounter: Payer: Self-pay | Admitting: Family Medicine

## 2014-04-13 ENCOUNTER — Ambulatory Visit (INDEPENDENT_AMBULATORY_CARE_PROVIDER_SITE_OTHER): Payer: Medicare Other | Admitting: Family Medicine

## 2014-04-13 VITALS — BP 130/80 | HR 75 | Resp 16 | Ht 63.0 in | Wt 157.0 lb

## 2014-04-13 DIAGNOSIS — C259 Malignant neoplasm of pancreas, unspecified: Secondary | ICD-10-CM | POA: Diagnosis not present

## 2014-04-13 DIAGNOSIS — I10 Essential (primary) hypertension: Secondary | ICD-10-CM

## 2014-04-13 DIAGNOSIS — F419 Anxiety disorder, unspecified: Secondary | ICD-10-CM

## 2014-04-13 DIAGNOSIS — M150 Primary generalized (osteo)arthritis: Secondary | ICD-10-CM

## 2014-04-13 DIAGNOSIS — J301 Allergic rhinitis due to pollen: Secondary | ICD-10-CM

## 2014-04-13 DIAGNOSIS — C787 Secondary malignant neoplasm of liver and intrahepatic bile duct: Secondary | ICD-10-CM

## 2014-04-13 NOTE — Progress Notes (Signed)
   Subjective:    Patient ID: [name], female    DOB: January 19, 1929, 80 y.o.   MRN: [id number]  HPI The PT is here for follow up and re-evaluation of chronic medical conditions, medication management and review of any available recent lab and radiology data.  Preventive health is updated, specifically  Cancer screening and Immunization.   Questions or concerns regarding consultations or procedures which the PT has had in the interim are  Addressed.Pleased with care from oncology, son recently met with treating MD The PT has low WBC from chemo preventing administration of 2nd round last week, otherwise , doing better than expected dealing with her new dx of metastatic pancreatic cancer, she currently is very hopeful as far as prognosis is concerned      Review of Systems See HPI Denies recent fever or chills. Denies sinus pressure, nasal congestion, ear pain or sore throat. Denies chest congestion, productive cough or wheezing. Denies chest pains, palpitations and leg swelling C/o intermittent  abdominal pain, nausea, diarrhea denies  constipation.   Denies dysuria, frequency, hesitancy or incontinence. C/o joint pain, swelling and limitation in mobility. Denies headaches, seizures, numbness, or tingling. Denies depression uncontrolled , anxiety or insomnia. Denies skin break down or rash.        Objective:   Physical Exam BP 130/80 mmHg  Pulse 75  Resp 16  Ht _0  (1.6 m)  Wt 157 lb (71.215 kg)  BMI 27.82 kg/m2  SpO2 96% Patient alert and oriented and in no cardiopulmonary distress.  HEENT: No facial asymmetry, EOMI,   oropharynx pink and moist.  Neck supple no JVD, no mass.  Chest: Clear to auscultation bilaterally.  CVS: S1, S2 no murmurs, no S3.Regular rate.  ABD: Soft non tender.   Ext: No edema  MS: decreased ROM spine, shoulders, hips and knees.  Skin: Intact, no ulcerations or rash noted.  Psych: Good eye contact, normal affect. Memory intact mildly  anxious, tearful at times, not depressed appearing.  CNS: CN 2-12 intact, power,  normal throughout.no focal deficits noted.        Assessment & Plan:  Essential hypertension Controlled, no change in medication DASH diet and commitment to daily physical activity for a minimum of 30 minutes discussed and encouraged, as a part of hypertension management. The importance of attaining a healthy weight is also discussed.   Anxiety Controlled, no change in medication Pt handling new dx of metastatic pancreatic cancer very well, has hope, and has excellent family support  Pancreatic cancer metastasized to liver Recently started chemotherapy, however treatment held last week due to leukopenia, overall tolerating trwatment fairly well, hair loss is the majot complainyt currently  ALLERGIC RHINITIS, SEASONAL No current flare, uses meds as needed  Primary generalized (osteo)arthritis Limitation in mobility more marked during the Winter months, handicap sticker , permanent provided

## 2014-04-13 NOTE — Patient Instructions (Signed)
F/u in 4 month, call if you need me before  No changes in medication   Exam today is good., handicap sticker today

## 2014-04-16 NOTE — Assessment & Plan Note (Signed)
Recently started chemotherapy, however treatment held last week due to leukopenia, overall tolerating trwatment fairly well, hair loss is the majot complainyt currently

## 2014-04-16 NOTE — Progress Notes (Signed)
[name], MD [address] [place] [address]  Pancreatic cancer metastasized to liver  Cancer of pancreas, tail - Plan: CBC with Differential, Comprehensive metabolic panel, Cancer antigen 19-9  CURRENT THERAPY: Gemzar/Abraxane on days 1, 8 every 28 days.  INTERVAL HISTORY: [name] 80 y.o. 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     Pancreatic cancer metastasized to liver   03/10/2014 Initial Diagnosis Pancreatic cancer metastasized to liver   03/22/2014 -  Chemotherapy Abraxane/Gemzar days 1, 8, every 28 days.  Day 15 was cancelled due to leukopenia and thrombocytopenia on day 15 cycle 1.    I personally reviewed and went over laboratory results with the patient.  The results are noted within this dictation.  She continues to tolerate therapy well.  She denies any complaints.  I provided her education regarding chemotherapy-induced cytopenias.  As a result of this complication, day 15 is cancelled and deleted.  Oncologically, she denies any complaints and ROS questioning is negative.  Her sister Pamala Hurry accompanies the patient and asks a number of questions regarding her sister's care which I answered.   Past Medical History  Diagnosis Date  . Hypertension 20 years   . Chronic diarrhea   . Anxiety   . Depression   . Kidney stone     hx/ crushed   . Hx of adenomatous colonic polyps     tubular adenomas, last found in 2008  . GERD (gastroesophageal reflux disease)   . Erosive esophagitis   . Schatzki's ring 08/26/10    Last dilated on EGD by Dr. Trevor [facility], linear gastric erosions, BI hemigastrectomy  . Hyperplastic colon polyp 03/19/10    tcs by Dr. Gala Romney  . Pancreatic cancer 02/2014  . Pancreatic cancer metastasized to liver 03/10/2014    has OVERWEIGHT; Essential hypertension; OSTEOARTHROSIS, LOCAL, PRIMARY, SHOULDER; DEGENERATIVE JOINT  DISEASE, CERVICAL SPINE; RUPTURE ROTATOR CUFF; ALLERGIC RHINITIS, SEASONAL; TREMOR; GERD (gastroesophageal reflux disease); Chronic diarrhea; Anxiety; Primary generalized (osteo)arthritis; Back pain with radiation; Weakness of left leg; Difficulty in walking; Routine general medical examination at a health care facility; IGT (impaired glucose tolerance); Fracture of right ankle; Encounter for annual physical exam; Anemia, iron deficiency; Elevated LFTs; Change in stool habits; Back pain without sciatica; Abnormal liver ultrasound; Pancreatic cancer metastasized to liver; and Liver metastases on her problem list.     is allergic to aciphex; amlodipine besylate-valsartan; esomeprazole magnesium; omeprazole; penicillins; and ciprofloxacin.  Ms. Sukhu does not currently have medications on file.  Past Surgical History  Procedure Laterality Date  . Stomach ulcer  50 years ago     had some of her stomach removed   . Bunion removal      from both feet   . Left shoulder surgery  2009    DR HARRISON  . Colonoscopy  03/19/2010    DR Gala Romney,, normal TI, pancolonic diverticula, random colon bx neg., hyperplastic polyps removed  . Esophagogastroduodenoscopy  11/22/2003    DR Gala Romney, erosive RE, Billroth I  . Esophagogastroduodenoscopy  08/26/10    Dr. Gala Romney- moderate severe ERE, Scahtzki ring s/p dilation, Billroth I, linear gastric erosions, bx-gastric xanthelasma  . Breast lumpectomy Left   . Orif ankle fracture Right 05/22/2013    Procedure: OPEN REDUCTION INTERNAL FIXATION (ORIF) RIGHT ANKLE FRACTURE;  Surgeon: Sanjuana Kava, MD;  Location: [facility];  Service: Orthopedics;  Laterality: Right;  . Cholecystectomy  1965  Denies any headaches, dizziness, double vision, fevers, chills, night sweats, nausea, vomiting, diarrhea, constipation, chest pain, heart palpitations, shortness of breath, blood in stool, black tarry stool, urinary pain, urinary burning, urinary frequency, hematuria.   PHYSICAL  EXAMINATION  ECOG PERFORMANCE STATUS: 0 - Asymptomatic  Filed Vitals:   04/19/14 1051  BP: 138/61  Pulse: 56  Temp: 98.4 F (36.9 C)  Resp: 16    GENERAL:alert, no distress, well nourished, well developed, comfortable, cooperative and smiling SKIN: skin color, texture, turgor are normal, no rashes or significant lesions HEAD: Normocephalic, No masses, lesions, tenderness or abnormalities EYES: normal, PERRLA, EOMI, Conjunctiva are pink and non-injected EARS: External ears normal OROPHARYNX:mucous membranes are moist  NECK: supple, trachea midline LYMPH:  no palpable lymphadenopathy BREAST:not examined LUNGS: clear to auscultation  HEART: regular rate & rhythm, no murmurs, no gallops, S1 normal and S2 normal ABDOMEN:abdomen soft, non-tender and normal bowel sounds BACK: Back symmetric, no curvature. EXTREMITIES:less then 2 second capillary refill, no joint deformities, effusion, or inflammation, no skin discoloration, no cyanosis  NEURO: alert & oriented x 3 with fluent speech, no focal motor/sensory deficits, gait normal   LABORATORY DATA: CBC    Component Value Date/Time   WBC 1.5* 04/05/2014 0922   RBC 3.74* 04/05/2014 0922   HGB 10.6* 04/05/2014 0922   HCT 30.9* 04/05/2014 0922   PLT 55* 04/05/2014 0922   MCV 82.6 04/05/2014 0922   MCH 28.3 04/05/2014 0922   MCHC 34.3 04/05/2014 0922   RDW 14.1 04/05/2014 0922   LYMPHSABS 1.0 04/05/2014 0922   MONOABS 0.0* 04/05/2014 0922   EOSABS 0.0 04/05/2014 0922   BASOSABS 0.0 04/05/2014 0922      Chemistry      Component Value Date/Time   NA 142 03/15/2014 1205   K 4.3 03/15/2014 1205   CL 105 03/15/2014 1205   CO2 25 03/15/2014 1205   BUN 18 03/15/2014 1205   CREATININE 0.78 03/15/2014 1205   CREATININE 0.97 02/14/2014 0758      Component Value Date/Time   CALCIUM 9.2 03/15/2014 1205   ALKPHOS 167* 03/10/2014 1600   AST 24 03/10/2014 1600   ALT 24 03/10/2014 1600   BILITOT 0.7 03/10/2014 1600           ASSESSMENT:  1. 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 2. Hypertension, controlled. 3. Diarrhea controlled with probiotic, pantoprazole, and cholestyramine.  Patient Active Problem List   Diagnosis Date Noted  . Pancreatic cancer metastasized to liver 03/10/2014  . Liver metastases 03/10/2014  . Abnormal liver ultrasound 02/27/2014  . Elevated LFTs 02/16/2014  . Change in stool habits 02/16/2014  . Back pain without sciatica 02/16/2014  . Anemia, iron deficiency 11/03/2013  . Encounter for annual physical exam 10/16/2013  . Fracture of right ankle 05/22/2013  . IGT (impaired glucose tolerance) 02/19/2013  . Routine general medical examination at a health care facility 08/18/2012  . Weakness of left leg 05/27/2012  . Difficulty in walking 05/27/2012  . Back pain with radiation 05/24/2012  . Primary generalized (osteo)arthritis 05/12/2012  . Anxiety 09/15/2011  . Chronic diarrhea 10/02/2010  . GERD (gastroesophageal reflux disease) 08/20/2010  . ALLERGIC RHINITIS, SEASONAL 06/30/2010  . TREMOR 06/25/2010  . OVERWEIGHT 01/07/2010  . Essential hypertension 01/07/2010  . DEGENERATIVE JOINT DISEASE, CERVICAL SPINE 04/14/2007  . RUPTURE ROTATOR CUFF 03/01/2007  . OSTEOARTHROSIS, LOCAL, PRIMARY, SHOULDER 02/17/2007     PLAN:  1. I personally reviewed and went over laboratory results  with the patient.  The results are noted within this dictation. 2. Pre-chemo labs as planned 3. Cycle 2 of chemotherapy today as planned. 4. Day 15 deleted from treatment plan due to cytopenias with cycle 1 of chemotherapy.  5. Return in 2 weeks for follow-up and day 15 of cycles 2.   THERAPY PLAN:  We will continue with chemotherapy (Gemzar/Abraxane) as planned and treat for 2-3 cycles followed by restaging scans to evaluate response to therapy and discussion regarding future therapy options.   All questions were answered. The patient knows to call  the clinic with any problems, questions or concerns. We can certainly see the patient much sooner if necessary.  Patient and plan discussed with Dr. Farrel Gobble and he is in agreement with the aforementioned.   KEFALAS,THOMAS 04/19/2014

## 2014-04-16 NOTE — Assessment & Plan Note (Signed)
Controlled, no change in medication Pt handling new dx of metastatic pancreatic cancer very well, has hope, and has excellent family support

## 2014-04-16 NOTE — Assessment & Plan Note (Signed)
Limitation in mobility more marked during the Winter months, handicap sticker , permanent provided

## 2014-04-16 NOTE — Assessment & Plan Note (Signed)
No current flare, uses meds as needed

## 2014-04-16 NOTE — Assessment & Plan Note (Signed)
Controlled, no change in medication DASH diet and commitment to daily physical activity for a minimum of 30 minutes discussed and encouraged, as a part of hypertension management. The importance of attaining a healthy weight is also discussed.  

## 2014-04-19 ENCOUNTER — Ambulatory Visit (HOSPITAL_COMMUNITY): Payer: Medicare Other | Admitting: Oncology

## 2014-04-19 ENCOUNTER — Encounter (HOSPITAL_BASED_OUTPATIENT_CLINIC_OR_DEPARTMENT_OTHER): Payer: Medicare Other | Admitting: Oncology

## 2014-04-19 ENCOUNTER — Encounter: Payer: Self-pay | Admitting: *Deleted

## 2014-04-19 ENCOUNTER — Encounter (HOSPITAL_COMMUNITY): Payer: Medicare Other | Attending: Hematology and Oncology

## 2014-04-19 ENCOUNTER — Encounter (HOSPITAL_COMMUNITY): Payer: Self-pay | Admitting: Oncology

## 2014-04-19 VITALS — BP 138/61 | HR 56 | Temp 98.4°F | Resp 16 | Wt 158.3 lb

## 2014-04-19 DIAGNOSIS — C787 Secondary malignant neoplasm of liver and intrahepatic bile duct: Secondary | ICD-10-CM | POA: Diagnosis not present

## 2014-04-19 DIAGNOSIS — C252 Malignant neoplasm of tail of pancreas: Secondary | ICD-10-CM

## 2014-04-19 DIAGNOSIS — C259 Malignant neoplasm of pancreas, unspecified: Secondary | ICD-10-CM

## 2014-04-19 DIAGNOSIS — I10 Essential (primary) hypertension: Secondary | ICD-10-CM

## 2014-04-19 DIAGNOSIS — R197 Diarrhea, unspecified: Secondary | ICD-10-CM | POA: Diagnosis not present

## 2014-04-19 DIAGNOSIS — C801 Malignant (primary) neoplasm, unspecified: Secondary | ICD-10-CM | POA: Insufficient documentation

## 2014-04-19 DIAGNOSIS — Z5111 Encounter for antineoplastic chemotherapy: Secondary | ICD-10-CM

## 2014-04-19 LAB — CBC WITH DIFFERENTIAL/PLATELET
BASOS PCT: 0 % (ref 0–1)
Basophils Absolute: 0 10*3/uL (ref 0.0–0.1)
EOS ABS: 0.1 10*3/uL (ref 0.0–0.7)
Eosinophils Relative: 2 % (ref 0–5)
HEMATOCRIT: 34.4 % — AB (ref 36.0–46.0)
Hemoglobin: 11.5 g/dL — ABNORMAL LOW (ref 12.0–15.0)
Lymphocytes Relative: 27 % (ref 12–46)
Lymphs Abs: 1.7 10*3/uL (ref 0.7–4.0)
MCH: 28.5 pg (ref 26.0–34.0)
MCHC: 33.4 g/dL (ref 30.0–36.0)
MCV: 85.4 fL (ref 78.0–100.0)
MONO ABS: 0.7 10*3/uL (ref 0.1–1.0)
Monocytes Relative: 11 % (ref 3–12)
Neutro Abs: 3.7 10*3/uL (ref 1.7–7.7)
Neutrophils Relative %: 59 % (ref 43–77)
Platelets: 356 10*3/uL (ref 150–400)
RBC: 4.03 MIL/uL (ref 3.87–5.11)
RDW: 15.2 % (ref 11.5–15.5)
WBC: 6.2 10*3/uL (ref 4.0–10.5)

## 2014-04-19 LAB — COMPREHENSIVE METABOLIC PANEL
ALBUMIN: 3.7 g/dL (ref 3.5–5.2)
ALT: 21 U/L (ref 0–35)
AST: 21 U/L (ref 0–37)
Alkaline Phosphatase: 129 U/L — ABNORMAL HIGH (ref 39–117)
Anion gap: 12 (ref 5–15)
BUN: 17 mg/dL (ref 6–23)
CO2: 27 meq/L (ref 19–32)
CREATININE: 0.93 mg/dL (ref 0.50–1.10)
Calcium: 9.1 mg/dL (ref 8.4–10.5)
Chloride: 103 mEq/L (ref 96–112)
GFR calc Af Amer: 64 mL/min — ABNORMAL LOW (ref >90)
GFR, EST NON AFRICAN AMERICAN: 55 mL/min — AB (ref >90)
Glucose, Bld: 131 mg/dL — ABNORMAL HIGH (ref 70–99)
Potassium: 4 mEq/L (ref 3.7–5.3)
Sodium: 142 mEq/L (ref 137–147)
Total Bilirubin: 0.4 mg/dL (ref 0.3–1.2)
Total Protein: 7.4 g/dL (ref 6.0–8.3)

## 2014-04-19 MED ORDER — HEPARIN SOD (PORK) LOCK FLUSH 100 UNIT/ML IV SOLN
INTRAVENOUS | Status: AC
  Filled 2014-04-19: qty 5

## 2014-04-19 MED ORDER — ONDANSETRON HCL 40 MG/20ML IJ SOLN
Freq: Once | INTRAMUSCULAR | Status: AC
  Administered 2014-04-19: 8 mg via INTRAVENOUS
  Filled 2014-04-19: qty 4

## 2014-04-19 MED ORDER — PACLITAXEL PROTEIN-BOUND CHEMO INJECTION 100 MG
125.0000 mg/m2 | Freq: Once | INTRAVENOUS | Status: AC
  Administered 2014-04-19: 225 mg via INTRAVENOUS
  Filled 2014-04-19: qty 45

## 2014-04-19 MED ORDER — SODIUM CHLORIDE 0.9 % IJ SOLN: 10.0000 mL | INTRAMUSCULAR | Status: DC | PRN

## 2014-04-19 MED ORDER — DEXAMETHASONE SODIUM PHOSPHATE 10 MG/ML IJ SOLN: 10.0000 mg | Freq: Once | INTRAMUSCULAR | Status: DC

## 2014-04-19 MED ORDER — SODIUM CHLORIDE 0.9 % IV SOLN
Freq: Once | INTRAVENOUS | Status: AC
  Administered 2014-04-19: 13:00:00 via INTRAVENOUS

## 2014-04-19 MED ORDER — HEPARIN SOD (PORK) LOCK FLUSH 100 UNIT/ML IV SOLN
500.0000 [IU] | Freq: Once | INTRAVENOUS | Status: AC | PRN
  Administered 2014-04-19: 500 [IU]

## 2014-04-19 MED ORDER — SODIUM CHLORIDE 0.9 % IV SOLN: 8.0000 mg | Freq: Once | INTRAVENOUS | Status: DC

## 2014-04-19 MED ORDER — SODIUM CHLORIDE 0.9 % IV SOLN
1000.0000 mg/m2 | Freq: Once | INTRAVENOUS | Status: AC
  Administered 2014-04-19: 1786 mg via INTRAVENOUS
  Filled 2014-04-19: qty 46.97

## 2014-04-19 NOTE — Patient Instructions (Signed)
[facility] Discharge Instructions for Patients Receiving Chemotherapy  Today you received the following chemotherapy agents Paclitaxel and Gemcitabine.  Please follow up as scheduled.   If you develop nausea and vomiting, or diarrhea that is not controlled by your medication, call the clinic.  The clinic phone number is [phone]. Office hours are Monday-Friday 8:30am-5:00pm.  BELOW ARE SYMPTOMS THAT SHOULD BE REPORTED IMMEDIATELY:  *FEVER GREATER THAN 101.0 F  *CHILLS WITH OR WITHOUT FEVER  NAUSEA AND VOMITING THAT IS NOT CONTROLLED WITH YOUR NAUSEA MEDICATION  *UNUSUAL SHORTNESS OF BREATH  *UNUSUAL BRUISING OR BLEEDING  TENDERNESS IN MOUTH AND THROAT WITH OR WITHOUT PRESENCE OF ULCERS  *URINARY PROBLEMS  *BOWEL PROBLEMS  UNUSUAL RASH Items with * indicate a potential emergency and should be followed up as soon as possible. If you have an emergency after office hours please contact your primary care physician or go to the nearest emergency department.  Please call the clinic during office hours if you have any questions or concerns.   You may also contact the Patient Navigator at [phone] should you have any questions or need assistance in obtaining follow up care. _____________________________________________________________________ Have you asked about our STAR program?    STAR stands for Survivorship Training and Rehabilitation, and this is a nationally recognized cancer care program that focuses on survivorship and rehabilitation.  Cancer and cancer treatments may cause problems, such as, pain, making you feel tired and keeping you from doing the things that you need or want to do. Cancer rehabilitation can help. Our goal is to reduce these troubling effects and help you have the best quality of life possible.  You may receive a survey from a nurse that asks questions about your current state of health.  Based on the survey results, all eligible  patients will be referred to the [facility] program for an evaluation so we can better serve you! A frequently asked questions sheet is available upon request.

## 2014-04-19 NOTE — Progress Notes (Signed)
[facility] Clinical Social Work  Clinical Social Work was referred by CSW rounding for assessment of psychosocial needs due to new patient.  Clinical Social Worker met with patient and her sister at [facility] to offer support and assess for needs.  CSW introduced self and explained role of CSW, provided info sheet. CSW reviewed common emotions experienced by new patients as they begin their cancer journey. Pt denied current concerns at this time. CSW will continue to follow and assist. Pt and sister are aware of how to contact CSW as needed.    Clinical Social Work interventions: Emotional support Education    Grier Hock, LCSW [facility] Tuesdays 8:30-1pm Wednesdays 8:30-12pm  [phone]  

## 2014-04-19 NOTE — Patient Instructions (Signed)
[facility] Discharge Instructions  RECOMMENDATIONS MADE BY THE CONSULTANT AND ANY TEST RESULTS WILL BE SENT TO YOUR REFERRING PHYSICIAN.  We will move on with cycle 2 of chemotherapy today as planned, pending the results of your lab work.  We will perform cancer marker testing today and we will get those results tomorrow.  We will call you tomorrow with that test result.  Return as planned for chemotherapy. Return in 2 weeks for office visit and follow-up appointment.   Thank you for choosing [facility] to provide your oncology and hematology care.  To afford each patient quality time with our providers, please arrive at least 15 minutes before your scheduled appointment time.  With your help, our goal is to use those 15 minutes to complete the necessary work-up to ensure our physicians have the information they need to help with your evaluation and healthcare recommendations.    Effective January 1st, 2014, we ask that you re-schedule your appointment with our physicians should you arrive 10 or more minutes late for your appointment.  We strive to give you quality time with our providers, and arriving late affects you and other patients whose appointments are after yours.    Again, thank you for choosing [facility].  Our hope is that these requests will decrease the amount of time that you wait before being seen by our physicians.       _____________________________________________________________  Should you have questions after your visit to [facility], please contact our office at [phone] between the hours of 8:30 a.m. and 5:00 p.m.  Voicemails left after 4:30 p.m. will not be returned until the following business day.  For prescription refill requests, have your pharmacy contact our office with your prescription refill request.

## 2014-04-19 NOTE — Progress Notes (Signed)
Patient tolerated treatment well.

## 2014-04-20 LAB — CANCER ANTIGEN 19-9: CA 19-9: 117 U/mL — ABNORMAL HIGH (ref <35.0)

## 2014-04-24 ENCOUNTER — Other Ambulatory Visit: Payer: Self-pay | Admitting: Family Medicine

## 2014-04-25 DIAGNOSIS — H04129 Dry eye syndrome of unspecified lacrimal gland: Secondary | ICD-10-CM | POA: Diagnosis not present

## 2014-04-25 DIAGNOSIS — H524 Presbyopia: Secondary | ICD-10-CM | POA: Diagnosis not present

## 2014-04-25 DIAGNOSIS — H5203 Hypermetropia, bilateral: Secondary | ICD-10-CM | POA: Diagnosis not present

## 2014-04-25 DIAGNOSIS — H52223 Regular astigmatism, bilateral: Secondary | ICD-10-CM | POA: Diagnosis not present

## 2014-04-26 ENCOUNTER — Encounter: Payer: Self-pay | Admitting: *Deleted

## 2014-04-26 ENCOUNTER — Encounter (HOSPITAL_BASED_OUTPATIENT_CLINIC_OR_DEPARTMENT_OTHER): Payer: Medicare Other

## 2014-04-26 DIAGNOSIS — C259 Malignant neoplasm of pancreas, unspecified: Secondary | ICD-10-CM

## 2014-04-26 DIAGNOSIS — C787 Secondary malignant neoplasm of liver and intrahepatic bile duct: Secondary | ICD-10-CM

## 2014-04-26 DIAGNOSIS — C252 Malignant neoplasm of tail of pancreas: Secondary | ICD-10-CM | POA: Diagnosis not present

## 2014-04-26 DIAGNOSIS — Z5111 Encounter for antineoplastic chemotherapy: Secondary | ICD-10-CM

## 2014-04-26 LAB — COMPREHENSIVE METABOLIC PANEL
ALBUMIN: 3.4 g/dL — AB (ref 3.5–5.2)
ALK PHOS: 118 U/L — AB (ref 39–117)
ALT: 17 U/L (ref 0–35)
ANION GAP: 10 (ref 5–15)
AST: 15 U/L (ref 0–37)
BILIRUBIN TOTAL: 0.4 mg/dL (ref 0.3–1.2)
BUN: 16 mg/dL (ref 6–23)
CHLORIDE: 107 meq/L (ref 96–112)
CO2: 28 meq/L (ref 19–32)
CREATININE: 0.86 mg/dL (ref 0.50–1.10)
Calcium: 8.7 mg/dL (ref 8.4–10.5)
GFR calc Af Amer: 70 mL/min — ABNORMAL LOW (ref >90)
GFR, EST NON AFRICAN AMERICAN: 61 mL/min — AB (ref >90)
GLUCOSE: 102 mg/dL — AB (ref 70–99)
POTASSIUM: 4 meq/L (ref 3.7–5.3)
Sodium: 145 mEq/L (ref 137–147)
Total Protein: 7.1 g/dL (ref 6.0–8.3)

## 2014-04-26 LAB — CBC WITH DIFFERENTIAL/PLATELET
BASOS PCT: 1 % (ref 0–1)
Basophils Absolute: 0 10*3/uL (ref 0.0–0.1)
Eosinophils Absolute: 0.1 10*3/uL (ref 0.0–0.7)
Eosinophils Relative: 2 % (ref 0–5)
HCT: 31.2 % — ABNORMAL LOW (ref 36.0–46.0)
Hemoglobin: 10.5 g/dL — ABNORMAL LOW (ref 12.0–15.0)
LYMPHS ABS: 1.3 10*3/uL (ref 0.7–4.0)
LYMPHS PCT: 51 % — AB (ref 12–46)
MCH: 28.2 pg (ref 26.0–34.0)
MCHC: 33.7 g/dL (ref 30.0–36.0)
MCV: 83.9 fL (ref 78.0–100.0)
MONO ABS: 0.2 10*3/uL (ref 0.1–1.0)
Monocytes Relative: 6 % (ref 3–12)
NEUTROS ABS: 1 10*3/uL — AB (ref 1.7–7.7)
NEUTROS PCT: 40 % — AB (ref 43–77)
Platelets: 148 10*3/uL — ABNORMAL LOW (ref 150–400)
RBC: 3.72 MIL/uL — AB (ref 3.87–5.11)
RDW: 14.8 % (ref 11.5–15.5)
WBC: 2.6 10*3/uL — AB (ref 4.0–10.5)

## 2014-04-26 LAB — CANCER ANTIGEN 19-9: CA 19-9: 99.8 U/mL — ABNORMAL HIGH (ref <35.0)

## 2014-04-26 MED ORDER — DEXAMETHASONE SODIUM PHOSPHATE 10 MG/ML IJ SOLN: 10.0000 mg | Freq: Once | INTRAMUSCULAR | Status: DC

## 2014-04-26 MED ORDER — SODIUM CHLORIDE 0.9 % IV SOLN: 8.0000 mg | Freq: Once | INTRAVENOUS | Status: DC

## 2014-04-26 MED ORDER — PACLITAXEL PROTEIN-BOUND CHEMO INJECTION 100 MG
125.0000 mg/m2 | Freq: Once | INTRAVENOUS | Status: AC
  Administered 2014-04-26: 225 mg via INTRAVENOUS
  Filled 2014-04-26: qty 45

## 2014-04-26 MED ORDER — SODIUM CHLORIDE 0.9 % IV SOLN
Freq: Once | INTRAVENOUS | Status: AC
  Administered 2014-04-26: 11:00:00 via INTRAVENOUS

## 2014-04-26 MED ORDER — SODIUM CHLORIDE 0.9 % IV SOLN
Freq: Once | INTRAVENOUS | Status: AC
  Administered 2014-04-26: 8 mg via INTRAVENOUS
  Filled 2014-04-26: qty 4

## 2014-04-26 MED ORDER — SODIUM CHLORIDE 0.9 % IJ SOLN: 10.0000 mL | INTRAMUSCULAR | Status: DC | PRN

## 2014-04-26 MED ORDER — HEPARIN SOD (PORK) LOCK FLUSH 100 UNIT/ML IV SOLN
500.0000 [IU] | Freq: Once | INTRAVENOUS | Status: AC | PRN
  Administered 2014-04-26: 500 [IU]
  Filled 2014-04-26: qty 5

## 2014-04-26 MED ORDER — GEMCITABINE HCL CHEMO INJECTION 1 GM/26.3ML
1000.0000 mg/m2 | Freq: Once | INTRAVENOUS | Status: AC
  Administered 2014-04-26: 1786 mg via INTRAVENOUS
  Filled 2014-04-26: qty 46.97

## 2014-04-26 NOTE — Patient Instructions (Signed)
[facility] Discharge Instructions for Patients Receiving Chemotherapy  Today you received the following chemotherapy agents Abraxane and Gemzar. Please call for any questions or concerns.  You were given your new schedule today.    If you develop nausea and vomiting, or diarrhea that is not controlled by your medication, call the clinic.  The clinic phone number is [phone]. Office hours are Monday-Friday 8:30am-5:00pm.  BELOW ARE SYMPTOMS THAT SHOULD BE REPORTED IMMEDIATELY:  *FEVER GREATER THAN 101.0 F  *CHILLS WITH OR WITHOUT FEVER  NAUSEA AND VOMITING THAT IS NOT CONTROLLED WITH YOUR NAUSEA MEDICATION  *UNUSUAL SHORTNESS OF BREATH  *UNUSUAL BRUISING OR BLEEDING  TENDERNESS IN MOUTH AND THROAT WITH OR WITHOUT PRESENCE OF ULCERS  *URINARY PROBLEMS  *BOWEL PROBLEMS  UNUSUAL RASH Items with * indicate a potential emergency and should be followed up as soon as possible. If you have an emergency after office hours please contact your primary care physician or go to the nearest emergency department.  Please call the clinic during office hours if you have any questions or concerns.   You may also contact the Patient Navigator at [phone] should you have any questions or need assistance in obtaining follow up care. _____________________________________________________________________ Have you asked about our STAR program?    STAR stands for Survivorship Training and Rehabilitation, and this is a nationally recognized cancer care program that focuses on survivorship and rehabilitation.  Cancer and cancer treatments may cause problems, such as, pain, making you feel tired and keeping you from doing the things that you need or want to do. Cancer rehabilitation can help. Our goal is to reduce these troubling effects and help you have the best quality of life possible.  You may receive a survey from a nurse that asks questions about your current state of  health.  Based on the survey results, all eligible patients will be referred to the [facility] program for an evaluation so we can better serve you! A frequently asked questions sheet is available upon request.

## 2014-04-26 NOTE — Progress Notes (Signed)
[facility] Clinical Social Work  Clinical Social Work was referred by CSW rounding for assessment of psychosocial needs. Clinical Social Worker met with patient at [facility] to offer support and assess for needs.   Pt reports she is doing well currently with her treatment and denies any practical or emotional concerns at this time. Pt aware CSW available as needed and agrees to reach out if needs arise.    Clinical Social Work interventions: Reassess needs   Grier Hock, LCSW [facility] Tuesdays 8:30-1pm Wednesdays 8:30-12pm  [phone]  

## 2014-04-26 NOTE — Progress Notes (Unsigned)
PA Kefalas notified of patients current labs, orders given to treat the patient today.

## 2014-04-27 ENCOUNTER — Telehealth (HOSPITAL_COMMUNITY): Payer: Self-pay

## 2014-04-27 NOTE — Telephone Encounter (Signed)
Error

## 2014-05-02 NOTE — Progress Notes (Signed)
[name] Nakayama, MD [address] [place] [address]  Stage IV pancreatic cancer with liver metastases  Elevated CA-19-9  CURRENT THERAPY: Gemzar/Abraxane on days 1,8 every 28 days.  INTERVAL HISTORY: [name] 80 y.o. female returns for follow-up of stage IV pancreatic cancer.  Has some fatigue. No pain, vomiting or fever. She gets dressed daily and does all of her housework. She has no complaints about her treatment. She will be spending Christmas with her sister.   Past Medical History  Diagnosis Date  . Hypertension 20 years   . Chronic diarrhea   . Anxiety   . Depression   . Kidney stone     hx/ crushed   . Hx of adenomatous colonic polyps     tubular adenomas, last found in 2008  . GERD (gastroesophageal reflux disease)   . Erosive esophagitis   . Schatzki's ring 08/26/10    Last dilated on EGD by Dr. Trevor [facility], linear gastric erosions, BI hemigastrectomy  . Hyperplastic colon polyp 03/19/10    tcs by Dr. Gala Romney  . Pancreatic cancer 02/2014  . Pancreatic cancer metastasized to liver 03/10/2014    has OVERWEIGHT; Essential hypertension; OSTEOARTHROSIS, LOCAL, PRIMARY, SHOULDER; DEGENERATIVE JOINT DISEASE, CERVICAL SPINE; RUPTURE ROTATOR CUFF; ALLERGIC RHINITIS, SEASONAL; TREMOR; GERD (gastroesophageal reflux disease); Chronic diarrhea; Anxiety; Primary generalized (osteo)arthritis; Back pain with radiation; Weakness of left leg; Difficulty in walking; Routine general medical examination at a health care facility; IGT (impaired glucose tolerance); Fracture of right ankle; Encounter for annual physical exam; Anemia, iron deficiency; Elevated LFTs; Change in stool habits; Back pain without sciatica; Abnormal liver ultrasound; Pancreatic cancer metastasized to liver; and Liver metastases on her problem list.      Pancreatic cancer metastasized to liver   03/10/2014 Initial Diagnosis Pancreatic cancer metastasized to liver   03/22/2014 -   Chemotherapy Abraxane/Gemzar days 1, 8, every 28 days.  Day 15 was cancelled due to leukopenia and thrombocytopenia on day 15 cycle 1.     is allergic to aciphex; amlodipine besylate-valsartan; esomeprazole magnesium; omeprazole; penicillins; and ciprofloxacin.  Ms. Linders had no medications administered during this visit.  Past Surgical History  Procedure Laterality Date  . Stomach ulcer  50 years ago     had some of her stomach removed   . Bunion removal      from both feet   . Left shoulder surgery  2009    DR HARRISON  . Colonoscopy  03/19/2010    DR Gala Romney,, normal TI, pancolonic diverticula, random colon bx neg., hyperplastic polyps removed  . Esophagogastroduodenoscopy  11/22/2003    DR Gala Romney, erosive RE, Billroth I  . Esophagogastroduodenoscopy  08/26/10    Dr. Gala Romney- moderate severe ERE, Scahtzki ring s/p dilation, Billroth I, linear gastric erosions, bx-gastric xanthelasma  . Breast lumpectomy Left   . Orif ankle fracture Right 05/22/2013    Procedure: OPEN REDUCTION INTERNAL FIXATION (ORIF) RIGHT ANKLE FRACTURE;  Surgeon: Sanjuana Kava, MD;  Location: [facility];  Service: Orthopedics;  Laterality: Right;  . Cholecystectomy  1965     Review of Systems  Constitutional: Positive for malaise/fatigue. Negative for fever, chills, weight loss and diaphoresis.  HENT: Negative.   Eyes: Negative.   Respiratory: Negative.   Cardiovascular: Negative.   Gastrointestinal: Negative.   Genitourinary: Negative.   Musculoskeletal: Positive for back pain. Negative for myalgias, joint pain, falls and neck pain.       Occasional Left shoulder pain, chronic  Skin: Negative.  Neurological: Negative.  Negative for weakness.  Endo/Heme/Allergies: Negative.   Psychiatric/Behavioral: Negative.     PHYSICAL EXAMINATION  ECOG PERFORMANCE STATUS: 1 - Symptomatic but completely ambulatory  Filed Vitals:   05/03/14 0900  BP: 129/54  Pulse: 55  Temp: 98.5 F (36.9 C)  Resp: 18    Physical  Exam  Constitutional: She is oriented to person, place, and time and well-developed, well-nourished, and in no distress.  HENT:  Head: Normocephalic and atraumatic.  Nose: Nose normal.  Mouth/Throat: Oropharynx is clear and moist. No oropharyngeal exudate.  Eyes: Conjunctivae and EOM are normal. Pupils are equal, round, and reactive to light. Right eye exhibits no discharge. Left eye exhibits no discharge. No scleral icterus.  Neck: Normal range of motion. Neck supple. No tracheal deviation present. No thyromegaly present.  Cardiovascular: Normal rate, regular rhythm and normal heart sounds.  Exam reveals no gallop and no friction rub.   No murmur heard. Pulmonary/Chest: Effort normal and breath sounds normal. She has no wheezes. She has no rales.  Abdominal: Soft. Bowel sounds are normal. She exhibits no distension and no mass. There is no tenderness. There is no rebound and no guarding.  Musculoskeletal: Normal range of motion. She exhibits no edema.  Lymphadenopathy:    She has no cervical adenopathy.  Neurological: She is alert and oriented to person, place, and time. She has normal reflexes. No cranial nerve deficit. Gait normal. Coordination normal.  Skin: Skin is warm and dry. No rash noted.  Psychiatric: Mood, memory, affect and judgment normal.  Nursing note and vitals reviewed.   LABORATORY DATA:  CBC    Component Value Date/Time   WBC 5.1 05/17/2014 0932   RBC 3.85* 05/17/2014 0932   HGB 11.1* 05/17/2014 0932   HCT 33.0* 05/17/2014 0932   PLT 348 05/17/2014 0932   MCV 85.7 05/17/2014 0932   MCH 28.8 05/17/2014 0932   MCHC 33.6 05/17/2014 0932   RDW 16.5* 05/17/2014 0932   LYMPHSABS 1.5 05/17/2014 0932   MONOABS 0.6 05/17/2014 0932   EOSABS 0.2 05/17/2014 0932   BASOSABS 0.1 05/17/2014 0932   CMP     Component Value Date/Time   NA 143 05/17/2014 0932   K 4.0 05/17/2014 0932   CL 111 05/17/2014 0932   CO2 28 05/17/2014 0932   GLUCOSE 112* 05/17/2014 0932   BUN  20 05/17/2014 0932   CREATININE 1.24* 05/17/2014 0932   CREATININE 0.97 02/14/2014 0758   CALCIUM 8.9 05/17/2014 0932   PROT 6.9 05/17/2014 0932   ALBUMIN 3.9 05/17/2014 0932   AST 17 05/17/2014 0932   ALT 15 05/17/2014 0932   ALKPHOS 99 05/17/2014 0932   BILITOT 0.7 05/17/2014 0932   GFRNONAA 39* 05/17/2014 0932   GFRNONAA 52* 11/04/2013 0820   GFRAA 35* 05/17/2014 0932   GFRAA 60 11/04/2013 0820       ASSESSMENT and THERAPY PLAN:   Pancreatic cancer metastasized to liver Pleasant 80 year old female with stage IV pancreatic cancer with excellent tolerance to Gemzar/abraxane.  CA 19-9 is slowly declining and she is doing quite well.  I have recommended ongoing therapy as scheduled.  We will see her back prior to her next cycle on D1. CT imaging has been ordered and we will arrange for f/u with me post to review the results.  If she has any interim problems or concerns she was advised to call or come in.     All questions were answered. The patient knows to call the clinic  with any problems, questions or concerns. We can certainly see the patient much sooner if necessary.  Molli Hazard 05/17/2014

## 2014-05-03 ENCOUNTER — Other Ambulatory Visit: Payer: Self-pay

## 2014-05-03 ENCOUNTER — Inpatient Hospital Stay (HOSPITAL_COMMUNITY): Payer: Medicare Other

## 2014-05-03 ENCOUNTER — Encounter (HOSPITAL_BASED_OUTPATIENT_CLINIC_OR_DEPARTMENT_OTHER): Payer: Medicare Other | Admitting: Hematology & Oncology

## 2014-05-03 ENCOUNTER — Encounter (HOSPITAL_COMMUNITY): Payer: Self-pay | Admitting: Hematology & Oncology

## 2014-05-03 VITALS — BP 129/54 | HR 55 | Temp 98.5°F | Resp 18 | Wt 158.3 lb

## 2014-05-03 DIAGNOSIS — C787 Secondary malignant neoplasm of liver and intrahepatic bile duct: Secondary | ICD-10-CM

## 2014-05-03 DIAGNOSIS — C259 Malignant neoplasm of pancreas, unspecified: Secondary | ICD-10-CM | POA: Diagnosis not present

## 2014-05-03 MED ORDER — AMLODIPINE-OLMESARTAN 10-40 MG PO TABS: 1.0000 | ORAL_TABLET | Freq: Every day | ORAL | Status: DC

## 2014-05-03 MED ORDER — CETIRIZINE HCL 10 MG PO TABS: 10.0000 mg | ORAL_TABLET | Freq: Every morning | ORAL | Status: DC

## 2014-05-03 MED ORDER — HYDROCODONE-ACETAMINOPHEN 7.5-325 MG PO TABS: 1.0000 | ORAL_TABLET | ORAL | Status: DC | PRN

## 2014-05-03 MED ORDER — AMLODIPINE-OLMESARTAN 10-40 MG PO TABS: 1.0000 | ORAL_TABLET | ORAL | Status: DC | PRN

## 2014-05-03 NOTE — Patient Instructions (Signed)
[facility] Discharge Instructions  RECOMMENDATIONS MADE BY THE CONSULTANT AND ANY TEST RESULTS WILL BE SENT TO YOUR REFERRING PHYSICIAN.  You will be due for your treatment again on 05/17/2014 If you develop any problems or concerns prior to follow-up do not hesitate to call. I have given you a prescription for hydrocodone. Use every 4 hours as needed for pain.  Thank you for choosing [facility] to provide your oncology and hematology care.  To afford each patient quality time with our providers, please arrive at least 15 minutes before your scheduled appointment time.  With your help, our goal is to use those 15 minutes to complete the necessary work-up to ensure our physicians have the information they need to help with your evaluation and healthcare recommendations.    Effective January 1st, 2014, we ask that you re-schedule your appointment with our physicians should you arrive 10 or more minutes late for your appointment.  We strive to give you quality time with our providers, and arriving late affects you and other patients whose appointments are after yours.    Again, thank you for choosing [facility].  Our hope is that these requests will decrease the amount of time that you wait before being seen by our physicians.       _____________________________________________________________  Should you have questions after your visit to [facility], please contact our office at [phone] between the hours of 8:30 a.m. and 5:00 p.m.  Voicemails left after 4:30 p.m. will not be returned until the following business day.  For prescription refill requests, have your pharmacy contact our office with your prescription refill request.

## 2014-05-10 ENCOUNTER — Inpatient Hospital Stay (HOSPITAL_COMMUNITY): Payer: Medicare Other

## 2014-05-10 ENCOUNTER — Encounter (HOSPITAL_COMMUNITY): Payer: Self-pay | Admitting: Hematology & Oncology

## 2014-05-12 ENCOUNTER — Other Ambulatory Visit: Payer: Self-pay | Admitting: Family Medicine

## 2014-05-14 NOTE — Assessment & Plan Note (Signed)
Tolerating therapy well thus far.  CA 19-9 is declining since starting therapy.  She is here today to start cycle 3 of chemotherapy.  She is due for restaging CT CAP with contrast at the completion of cycle three and therefore, I have ordered these tests.  Pre-chemo labs ordered.  Return in 2-3 weeks following CT imaging to review results and discuss future treatment options.

## 2014-05-14 NOTE — Progress Notes (Signed)
[name], MD [address] [place] [address]  Pancreatic cancer metastasized to liver - Plan: CT Chest W Contrast, CT Abdomen Pelvis W Contrast  CURRENT THERAPY:Gemzar/Abraxane on days 1, 8 every 28 days.  INTERVAL HISTORY: Chelbi Herber 81 y.o. 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     Pancreatic cancer metastasized to liver   03/10/2014 Initial Diagnosis Pancreatic cancer metastasized to liver   03/22/2014 -  Chemotherapy Abraxane/Gemzar days 1, 8, every 28 days.  Day 15 was cancelled due to leukopenia and thrombocytopenia on day 15 cycle 1.   I personally reviewed and went over laboratory results with the patient.  The results are noted within this dictation.  She continue to tolerate therapy well without any complaints.   I informed her that this is the start of cycle 3 of chemotherapy.  Therefore, the week of Jan 25th, we will restage her with CT imaging.  "Well do you think it is getting smaller?" she asks.  The way to tell is with pictures and thus the CT scans.   She is educated that her cancer marker is declining and that is generally a good thing.  "Is there anything I can do to make it shrink more?"  There is nothing she can do to help with her response to therapy other than eat healthily and be active when able.   Oncologically, she denies any complaints and ROS questioning is negative.   Past Medical History  Diagnosis Date  . Hypertension 20 years   . Chronic diarrhea   . Anxiety   . Depression   . Kidney stone     hx/ crushed   . Hx of adenomatous colonic polyps     tubular adenomas, last found in 2008  . GERD (gastroesophageal reflux disease)   . Erosive esophagitis   . Schatzki's ring 08/26/10    Last dilated on EGD by Dr. Trevor Iha [facility], linear gastric erosions, BI hemigastrectomy  . Hyperplastic colon polyp 03/19/10    tcs by Dr. Gala Romney  . Pancreatic cancer  02/2014  . Pancreatic cancer metastasized to liver 03/10/2014    has OVERWEIGHT; Essential hypertension; OSTEOARTHROSIS, LOCAL, PRIMARY, SHOULDER; DEGENERATIVE JOINT DISEASE, CERVICAL SPINE; RUPTURE ROTATOR CUFF; ALLERGIC RHINITIS, SEASONAL; TREMOR; GERD (gastroesophageal reflux disease); Chronic diarrhea; Anxiety; Primary generalized (osteo)arthritis; Back pain with radiation; Weakness of left leg; Difficulty in walking; Routine general medical examination at a health care facility; IGT (impaired glucose tolerance); Fracture of right ankle; Encounter for annual physical exam; Anemia, iron deficiency; Elevated LFTs; Change in stool habits; Back pain without sciatica; Abnormal liver ultrasound; Pancreatic cancer metastasized to liver; and Liver metastases on her problem list.     is allergic to aciphex; amlodipine besylate-valsartan; esomeprazole magnesium; omeprazole; penicillins; and ciprofloxacin.  Ms. Resetar does not currently have medications on file.  Past Surgical History  Procedure Laterality Date  . Stomach ulcer  50 years ago     had some of her stomach removed   . Bunion removal      from both feet   . Left shoulder surgery  2009    DR HARRISON  . Colonoscopy  03/19/2010    DR Gala Romney,, normal TI, pancolonic diverticula, random colon bx neg., hyperplastic polyps removed  . Esophagogastroduodenoscopy  11/22/2003    DR Gala Romney, erosive RE, Billroth I  . Esophagogastroduodenoscopy  08/26/10    Dr. Gala Romney- moderate severe ERE, Scahtzki  ring s/p dilation, Billroth I, linear gastric erosions, bx-gastric xanthelasma  . Breast lumpectomy Left   . Orif ankle fracture Right 05/22/2013    Procedure: OPEN REDUCTION INTERNAL FIXATION (ORIF) RIGHT ANKLE FRACTURE;  Surgeon: Sanjuana Kava, MD;  Location: [facility];  Service: Orthopedics;  Laterality: Right;  . Cholecystectomy  1965     Denies any headaches, dizziness, double vision, fevers, chills, night sweats, nausea, vomiting, diarrhea, constipation,  chest pain, heart palpitations, shortness of breath, blood in stool, black tarry stool, urinary pain, urinary burning, urinary frequency, hematuria.   PHYSICAL EXAMINATION  ECOG PERFORMANCE STATUS: 1 - Symptomatic but completely ambulatory  There were no vitals filed for this visit.  GENERAL:alert, no distress, well nourished, well developed, comfortable, cooperative, obese and smiling SKIN: skin color, texture, turgor are normal, no rashes or significant lesions HEAD: Normocephalic, No masses, lesions, tenderness or abnormalities EYES: normal, PERRLA, EOMI, Conjunctiva are pink and non-injected EARS: External ears normal OROPHARYNX:lips, buccal mucosa, and tongue normal and mucous membranes are moist  NECK: supple, no adenopathy, thyroid normal size, non-tender, without nodularity, no stridor, non-tender, trachea midline LYMPH:  no palpable lymphadenopathy, no hepatosplenomegaly BREAST:not examined LUNGS: clear to auscultation  HEART: regular rate & rhythm ABDOMEN:abdomen soft, non-tender and normal bowel sounds BACK: Back symmetric, no curvature. EXTREMITIES:less then 2 second capillary refill, no skin discoloration, no cyanosis  NEURO: alert & oriented x 3 with fluent speech, no focal motor/sensory deficits  LABORATORY DATA: CBC    Component Value Date/Time   WBC 5.1 05/17/2014 0932   RBC 3.85* 05/17/2014 0932   HGB 11.1* 05/17/2014 0932   HCT 33.0* 05/17/2014 0932   PLT 348 05/17/2014 0932   MCV 85.7 05/17/2014 0932   MCH 28.8 05/17/2014 0932   MCHC 33.6 05/17/2014 0932   RDW 16.5* 05/17/2014 0932   LYMPHSABS 1.5 05/17/2014 0932   MONOABS 0.6 05/17/2014 0932   EOSABS 0.2 05/17/2014 0932   BASOSABS 0.1 05/17/2014 0932      Chemistry      Component Value Date/Time   NA 145 04/26/2014 0924   K 4.0 04/26/2014 0924   CL 107 04/26/2014 0924   CO2 28 04/26/2014 0924   BUN 16 04/26/2014 0924   CREATININE 0.86 04/26/2014 0924   CREATININE 0.97 02/14/2014 0758        Component Value Date/Time   CALCIUM 8.7 04/26/2014 0924   ALKPHOS 118* 04/26/2014 0924   AST 15 04/26/2014 0924   ALT 17 04/26/2014 0924   BILITOT 0.4 04/26/2014 0924     Results for ERVIN, ROTHBAUER (MRN [id number]) as of 05/14/2014 09:16  Ref. Range 03/10/2014 16:00 03/22/2014 10:58 04/19/2014 12:11 04/26/2014 09:24  CA 19-9 Latest Range: <35.0 U/mL 153.1 (H) 152.4 (H) 117.0 (H) 99.8 (H)     ASSESSMENT AND PLAN:  Pancreatic cancer metastasized to liver Tolerating therapy well thus far.  CA 19-9 is declining since starting therapy.  She is here today to start cycle 3 of chemotherapy.  She is due for restaging CT CAP with contrast at the completion of cycle three and therefore, I have ordered these tests.  Pre-chemo labs ordered.  Return in 2-3 weeks following CT imaging to review results and discuss future treatment options.    THERAPY PLAN:  Continue treatment as planned, followed by restaging scans later this month.  All questions were answered. The patient knows to call the clinic with any problems, questions or concerns. We can certainly see the patient much sooner if necessary.  Patient and plan discussed  with Dr. Ancil Linsey and she is in agreement with the aforementioned.   Henreitta Spittler 05/17/2014

## 2014-05-17 ENCOUNTER — Encounter (HOSPITAL_COMMUNITY): Payer: Self-pay

## 2014-05-17 ENCOUNTER — Encounter (HOSPITAL_COMMUNITY): Payer: Medicare Other | Attending: Hematology and Oncology

## 2014-05-17 ENCOUNTER — Encounter (HOSPITAL_BASED_OUTPATIENT_CLINIC_OR_DEPARTMENT_OTHER): Payer: Medicare Other | Admitting: Oncology

## 2014-05-17 DIAGNOSIS — C787 Secondary malignant neoplasm of liver and intrahepatic bile duct: Secondary | ICD-10-CM | POA: Diagnosis not present

## 2014-05-17 DIAGNOSIS — C259 Malignant neoplasm of pancreas, unspecified: Secondary | ICD-10-CM | POA: Insufficient documentation

## 2014-05-17 DIAGNOSIS — Z5111 Encounter for antineoplastic chemotherapy: Secondary | ICD-10-CM

## 2014-05-17 DIAGNOSIS — C801 Malignant (primary) neoplasm, unspecified: Secondary | ICD-10-CM | POA: Insufficient documentation

## 2014-05-17 DIAGNOSIS — C252 Malignant neoplasm of tail of pancreas: Secondary | ICD-10-CM | POA: Diagnosis not present

## 2014-05-17 LAB — COMPREHENSIVE METABOLIC PANEL
ALK PHOS: 99 U/L (ref 39–117)
ALT: 15 U/L (ref 0–35)
AST: 17 U/L (ref 0–37)
Albumin: 3.9 g/dL (ref 3.5–5.2)
Anion gap: 4 — ABNORMAL LOW (ref 5–15)
BUN: 20 mg/dL (ref 6–23)
CO2: 28 mmol/L (ref 19–32)
CREATININE: 1.24 mg/dL — AB (ref 0.50–1.10)
Calcium: 8.9 mg/dL (ref 8.4–10.5)
Chloride: 111 mEq/L (ref 96–112)
GFR calc non Af Amer: 39 mL/min — ABNORMAL LOW (ref >90)
GFR, EST AFRICAN AMERICAN: 45 mL/min — AB (ref >90)
GLUCOSE: 112 mg/dL — AB (ref 70–99)
Potassium: 4 mmol/L (ref 3.5–5.1)
SODIUM: 143 mmol/L (ref 135–145)
Total Bilirubin: 0.7 mg/dL (ref 0.3–1.2)
Total Protein: 6.9 g/dL (ref 6.0–8.3)

## 2014-05-17 LAB — CBC WITH DIFFERENTIAL/PLATELET
BASOS PCT: 1 % (ref 0–1)
Basophils Absolute: 0.1 10*3/uL (ref 0.0–0.1)
EOS ABS: 0.2 10*3/uL (ref 0.0–0.7)
EOS PCT: 4 % (ref 0–5)
HCT: 33 % — ABNORMAL LOW (ref 36.0–46.0)
HEMOGLOBIN: 11.1 g/dL — AB (ref 12.0–15.0)
LYMPHS ABS: 1.5 10*3/uL (ref 0.7–4.0)
LYMPHS PCT: 30 % (ref 12–46)
MCH: 28.8 pg (ref 26.0–34.0)
MCHC: 33.6 g/dL (ref 30.0–36.0)
MCV: 85.7 fL (ref 78.0–100.0)
Monocytes Absolute: 0.6 10*3/uL (ref 0.1–1.0)
Monocytes Relative: 11 % (ref 3–12)
NEUTROS PCT: 54 % (ref 43–77)
Neutro Abs: 2.8 10*3/uL (ref 1.7–7.7)
Platelets: 348 10*3/uL (ref 150–400)
RBC: 3.85 MIL/uL — ABNORMAL LOW (ref 3.87–5.11)
RDW: 16.5 % — ABNORMAL HIGH (ref 11.5–15.5)
WBC: 5.1 10*3/uL (ref 4.0–10.5)

## 2014-05-17 LAB — CANCER ANTIGEN 19-9: CA 19-9: 63.2 U/mL — ABNORMAL HIGH (ref <35.0)

## 2014-05-17 MED ORDER — PACLITAXEL PROTEIN-BOUND CHEMO INJECTION 100 MG
125.0000 mg/m2 | Freq: Once | INTRAVENOUS | Status: AC
  Administered 2014-05-17: 225 mg via INTRAVENOUS
  Filled 2014-05-17: qty 45

## 2014-05-17 MED ORDER — DEXAMETHASONE SODIUM PHOSPHATE 10 MG/ML IJ SOLN: 10.0000 mg | Freq: Once | INTRAMUSCULAR | Status: DC

## 2014-05-17 MED ORDER — SODIUM CHLORIDE 0.9 % IV SOLN
Freq: Once | INTRAVENOUS | Status: AC
  Administered 2014-05-17: 10:00:00 via INTRAVENOUS

## 2014-05-17 MED ORDER — SODIUM CHLORIDE 0.9 % IV SOLN
1000.0000 mg/m2 | Freq: Once | INTRAVENOUS | Status: AC
  Administered 2014-05-17: 1786 mg via INTRAVENOUS
  Filled 2014-05-17: qty 46.97

## 2014-05-17 MED ORDER — HEPARIN SOD (PORK) LOCK FLUSH 100 UNIT/ML IV SOLN
500.0000 [IU] | Freq: Once | INTRAVENOUS | Status: AC | PRN
  Administered 2014-05-17: 500 [IU]
  Filled 2014-05-17 (×2): qty 5

## 2014-05-17 MED ORDER — SODIUM CHLORIDE 0.9 % IJ SOLN: 10.0000 mL | INTRAMUSCULAR | Status: DC | PRN

## 2014-05-17 MED ORDER — SODIUM CHLORIDE 0.9 % IV SOLN: 8.0000 mg | Freq: Once | INTRAVENOUS | Status: DC

## 2014-05-17 MED ORDER — SODIUM CHLORIDE 0.9 % IV SOLN
Freq: Once | INTRAVENOUS | Status: AC
  Administered 2014-05-17: 8 mg via INTRAVENOUS
  Filled 2014-05-17: qty 4

## 2014-05-17 NOTE — Progress Notes (Signed)
1315:  Tolerated treatment w/o adverse reaction.  A&Ox4; in no distress. Discharged ambulatory.

## 2014-05-17 NOTE — Patient Instructions (Signed)
[facility] Discharge Instructions  RECOMMENDATIONS MADE BY THE CONSULTANT AND ANY TEST RESULTS WILL BE SENT TO YOUR REFERRING PHYSICIAN.  Today is the start of cycle 3 of chemotherapy.  You will get treated today and again next week. We will order restaging CT scans the week of Jan 25th We will see you back after CT scans to review results.   Thank you for choosing [facility] to provide your oncology and hematology care.  To afford each patient quality time with our providers, please arrive at least 15 minutes before your scheduled appointment time.  With your help, our goal is to use those 15 minutes to complete the necessary work-up to ensure our physicians have the information they need to help with your evaluation and healthcare recommendations.    Effective January 1st, 2014, we ask that you re-schedule your appointment with our physicians should you arrive 10 or more minutes late for your appointment.  We strive to give you quality time with our providers, and arriving late affects you and other patients whose appointments are after yours.    Again, thank you for choosing [facility].  Our hope is that these requests will decrease the amount of time that you wait before being seen by our physicians.       _____________________________________________________________  Should you have questions after your visit to [facility], please contact our office at [phone] between the hours of 8:30 a.m. and 5:00 p.m.  Voicemails left after 4:30 p.m. will not be returned until the following business day.  For prescription refill requests, have your pharmacy contact our office with your prescription refill request.

## 2014-05-17 NOTE — Assessment & Plan Note (Signed)
Pleasant 81 year old female with stage IV pancreatic cancer with excellent tolerance to Gemzar/abraxane.  CA 19-9 is slowly declining and she is doing quite well.  I have recommended ongoing therapy as scheduled.  We will see her back prior to her next cycle on D1. CT imaging has been ordered and we will arrange for f/u with me post to review the results.  If she has any interim problems or concerns she was advised to call or come in.

## 2014-05-21 NOTE — Progress Notes (Signed)
[name] Nakayama, MD [address] [place] [address]  Pancreatic cancer metastasized to liver  CURRENT THERAPY:Gemzar/Abraxane on days 1, 8 every 28 days.  INTERVAL HISTORY: [name] 81 y.o. 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     Pancreatic cancer metastasized to liver   03/10/2014 Initial Diagnosis Pancreatic cancer metastasized to liver   03/22/2014 -  Chemotherapy Abraxane/Gemzar days 1, 8, every 28 days.  Day 15 was cancelled due to leukopenia and thrombocytopenia on day 15 cycle 1.   05/24/2014 Treatment Plan Change Day 8 of cycle 3 is held with ANC of 1.1   I personally reviewed and went over laboratory results with the patient.  The results are noted within this dictation.  She continues to tolerate therapy well without any complaints.   She has upcoming restaging scans ordered and scheduled.  These will be used to help evaluate response to therapy.   Today her [place] is 1.1 and therefore we will hold treatment today since she is undergoing treatment in a palliative setting.     Past Medical History  Diagnosis Date  . Hypertension 20 years   . Chronic diarrhea   . Anxiety   . Depression   . Kidney stone     hx/ crushed   . Hx of adenomatous colonic polyps     tubular adenomas, last found in 2008  . GERD (gastroesophageal reflux disease)   . Erosive esophagitis   . Schatzki's ring 08/26/10    Last dilated on EGD by Dr. [facility], linear gastric erosions, BI hemigastrectomy  . Hyperplastic colon polyp 03/19/10    tcs by Dr. Gala Romney  . Pancreatic cancer 02/2014  . Pancreatic cancer metastasized to liver 03/10/2014    has OVERWEIGHT; Essential hypertension; OSTEOARTHROSIS, LOCAL, PRIMARY, SHOULDER; DEGENERATIVE JOINT DISEASE, CERVICAL SPINE; RUPTURE ROTATOR CUFF; ALLERGIC RHINITIS, SEASONAL; TREMOR; GERD (gastroesophageal reflux disease); Chronic diarrhea; Anxiety;  Primary generalized (osteo)arthritis; Back pain with radiation; Weakness of left leg; Difficulty in walking; Routine general medical examination at a health care facility; IGT (impaired glucose tolerance); Fracture of right ankle; Encounter for annual physical exam; Anemia, iron deficiency; Elevated LFTs; Change in stool habits; Back pain without sciatica; Abnormal liver ultrasound; Pancreatic cancer metastasized to liver; and Liver metastases on her problem list.     is allergic to aciphex; amlodipine besylate-valsartan; esomeprazole magnesium; omeprazole; penicillins; and ciprofloxacin.  [name] does not currently have medications on file.  Past Surgical History  Procedure Laterality Date  . Stomach ulcer  50 years ago     had some of her stomach removed   . Bunion removal      from both feet   . Left shoulder surgery  2009    DR HARRISON  . Colonoscopy  03/19/2010    DR Gala Romney,, normal TI, pancolonic diverticula, random colon bx neg., hyperplastic polyps removed  . Esophagogastroduodenoscopy  11/22/2003    DR Gala Romney, erosive RE, Billroth I  . Esophagogastroduodenoscopy  08/26/10    Dr. Gala Romney- moderate severe ERE, Scahtzki ring s/p dilation, Billroth I, linear gastric erosions, bx-gastric xanthelasma  . Breast lumpectomy Left   . Orif ankle fracture Right 05/22/2013    Procedure: OPEN REDUCTION INTERNAL FIXATION (ORIF) RIGHT ANKLE FRACTURE;  Surgeon: Sanjuana Kava, MD;  Location: [facility];  Service: Orthopedics;  Laterality: Right;  . Cholecystectomy  1965     Denies any headaches, dizziness, double vision, fevers,  chills, night sweats, nausea, vomiting, diarrhea, constipation, chest pain, heart palpitations, shortness of breath, blood in stool, black tarry stool, urinary pain, urinary burning, urinary frequency, hematuria.   PHYSICAL EXAMINATION  ECOG PERFORMANCE STATUS: 1 - Symptomatic but completely ambulatory  Filed Vitals:   05/24/14 1030  BP: 144/68  Pulse: 65  Temp: 97.8 F  (36.6 C)  Resp: 18    GENERAL:alert, no distress, well nourished, well developed, comfortable, cooperative, obese and smiling SKIN: skin color, texture, turgor are normal, no rashes or significant lesions HEAD: Normocephalic, No masses, lesions, tenderness or abnormalities EYES: normal, PERRLA, EOMI, Conjunctiva are pink and non-injected EARS: External ears normal OROPHARYNX:lips, buccal mucosa, and tongue normal and mucous membranes are moist  NECK: supple, no adenopathy, thyroid normal size, non-tender, without nodularity, no stridor, non-tender, trachea midline LYMPH:  no palpable lymphadenopathy, no hepatosplenomegaly BREAST:not examined LUNGS: not examined HEART: not examined ABDOMEN:not examined BACK: Back symmetric, no curvature. EXTREMITIES:less then 2 second capillary refill, no skin discoloration, no cyanosis  NEURO: alert & oriented x 3 with fluent speech, no focal motor/sensory deficits  LABORATORY DATA: CBC    Component Value Date/Time   WBC 2.6* 05/24/2014 1010   RBC 3.43* 05/24/2014 1010   HGB 9.8* 05/24/2014 1010   HCT 29.3* 05/24/2014 1010   PLT 133* 05/24/2014 1010   MCV 85.4 05/24/2014 1010   MCH 28.6 05/24/2014 1010   MCHC 33.4 05/24/2014 1010   RDW 15.5 05/24/2014 1010   LYMPHSABS 1.2 05/24/2014 1010   MONOABS 0.2 05/24/2014 1010   EOSABS 0.1 05/24/2014 1010   BASOSABS 0.0 05/24/2014 1010      Chemistry      Component Value Date/Time   NA 143 05/17/2014 0932   K 4.0 05/17/2014 0932   CL 111 05/17/2014 0932   CO2 28 05/17/2014 0932   BUN 20 05/17/2014 0932   CREATININE 1.24* 05/17/2014 0932   CREATININE 0.97 02/14/2014 0758      Component Value Date/Time   CALCIUM 8.9 05/17/2014 0932   ALKPHOS 99 05/17/2014 0932   AST 17 05/17/2014 0932   ALT 15 05/17/2014 0932   BILITOT 0.7 05/17/2014 0932     Results for [name], [name] (MRN [id number]) as of 05/14/2014 09:16  Ref. Range 03/10/2014 16:00 03/22/2014 10:58 04/19/2014 12:11 04/26/2014 09:24    CA 19-9 Latest Range: <35.0 U/mL 153.1 (H) 152.4 (H) 117.0 (H) 99.8 (H)     ASSESSMENT AND PLAN:  Pancreatic cancer metastasized to liver Tolerating therapy well thus far.  CA 19-9 is declining since starting therapy.  She is here today to start cycle 3 day 8 of chemotherapy, but her ANC is 1.1 and therefore treatment will be held today since she is being treated in a palliative setting.  She is due for restaging CT CAP with contrast at the completion of cycle three and therefore, I have ordered these tests.  Pre-chemo labs ordered.  Return in 1-2 weeks following CT imaging to review results and discuss future treatment options.      THERAPY PLAN:  Continue treatment as planned, followed by restaging scans later this month.  All questions were answered. The patient knows to call the clinic with any problems, questions or concerns. We can certainly see the patient much sooner if necessary.  Patient and plan discussed with Dr. Ancil Linsey and she is in agreement with the aforementioned.   [name] 05/24/2014

## 2014-05-21 NOTE — Assessment & Plan Note (Addendum)
Tolerating therapy well thus far.  CA 19-9 is declining since starting therapy.  She is here today to start cycle 3 day 8 of chemotherapy, but her ANC is 1.1 and therefore treatment will be held today since she is being treated in a palliative setting.  She is due for restaging CT CAP with contrast at the completion of cycle three and therefore, I have ordered these tests.  Pre-chemo labs ordered.  Return in 1-2 weeks following CT imaging to review results and discuss future treatment options.

## 2014-05-24 ENCOUNTER — Encounter (HOSPITAL_COMMUNITY): Payer: Self-pay | Admitting: Oncology

## 2014-05-24 ENCOUNTER — Telehealth (HOSPITAL_COMMUNITY): Payer: Self-pay | Admitting: Oncology

## 2014-05-24 ENCOUNTER — Encounter (HOSPITAL_BASED_OUTPATIENT_CLINIC_OR_DEPARTMENT_OTHER): Payer: Medicare Other

## 2014-05-24 ENCOUNTER — Telehealth (HOSPITAL_COMMUNITY): Payer: Self-pay | Admitting: *Deleted

## 2014-05-24 ENCOUNTER — Encounter (HOSPITAL_BASED_OUTPATIENT_CLINIC_OR_DEPARTMENT_OTHER): Payer: Medicare Other | Admitting: Oncology

## 2014-05-24 VITALS — BP 144/68 | HR 65 | Temp 97.8°F | Resp 18 | Wt 157.2 lb

## 2014-05-24 DIAGNOSIS — C259 Malignant neoplasm of pancreas, unspecified: Secondary | ICD-10-CM | POA: Diagnosis not present

## 2014-05-24 DIAGNOSIS — C801 Malignant (primary) neoplasm, unspecified: Secondary | ICD-10-CM | POA: Diagnosis not present

## 2014-05-24 DIAGNOSIS — C787 Secondary malignant neoplasm of liver and intrahepatic bile duct: Secondary | ICD-10-CM | POA: Diagnosis not present

## 2014-05-24 DIAGNOSIS — C252 Malignant neoplasm of tail of pancreas: Secondary | ICD-10-CM

## 2014-05-24 DIAGNOSIS — R05 Cough: Secondary | ICD-10-CM

## 2014-05-24 DIAGNOSIS — R059 Cough, unspecified: Secondary | ICD-10-CM

## 2014-05-24 LAB — CBC WITH DIFFERENTIAL/PLATELET
BASOS PCT: 1 % (ref 0–1)
Basophils Absolute: 0 10*3/uL (ref 0.0–0.1)
EOS PCT: 2 % (ref 0–5)
Eosinophils Absolute: 0.1 10*3/uL (ref 0.0–0.7)
HEMATOCRIT: 29.3 % — AB (ref 36.0–46.0)
Hemoglobin: 9.8 g/dL — ABNORMAL LOW (ref 12.0–15.0)
Lymphocytes Relative: 46 % (ref 12–46)
Lymphs Abs: 1.2 10*3/uL (ref 0.7–4.0)
MCH: 28.6 pg (ref 26.0–34.0)
MCHC: 33.4 g/dL (ref 30.0–36.0)
MCV: 85.4 fL (ref 78.0–100.0)
MONO ABS: 0.2 10*3/uL (ref 0.1–1.0)
Monocytes Relative: 6 % (ref 3–12)
Neutro Abs: 1.1 10*3/uL — ABNORMAL LOW (ref 1.7–7.7)
Neutrophils Relative %: 45 % (ref 43–77)
Platelets: 133 10*3/uL — ABNORMAL LOW (ref 150–400)
RBC: 3.43 MIL/uL — ABNORMAL LOW (ref 3.87–5.11)
RDW: 15.5 % (ref 11.5–15.5)
WBC: 2.6 10*3/uL — ABNORMAL LOW (ref 4.0–10.5)

## 2014-05-24 MED ORDER — HEPARIN SOD (PORK) LOCK FLUSH 100 UNIT/ML IV SOLN
500.0000 [IU] | Freq: Once | INTRAVENOUS | Status: AC
  Administered 2014-05-24: 500 [IU] via INTRAVENOUS

## 2014-05-24 MED ORDER — HEPARIN SOD (PORK) LOCK FLUSH 100 UNIT/ML IV SOLN
INTRAVENOUS | Status: AC
  Filled 2014-05-24: qty 5

## 2014-05-24 MED ORDER — BENZONATATE 200 MG PO CAPS: 200.0000 mg | ORAL_CAPSULE | Freq: Three times a day (TID) | ORAL | Status: DC | PRN

## 2014-05-24 MED ORDER — SODIUM CHLORIDE 0.9 % IV SOLN
INTRAVENOUS | Status: DC
  Administered 2014-05-24: 10:00:00 via INTRAVENOUS

## 2014-05-24 NOTE — Patient Instructions (Signed)
[facility] Discharge Instructions for Patients Receiving Chemotherapy  Today you received the following chemotherapy agents We held your chemotherapy today because of your blood counts.. Please return as scheduled  To help prevent nausea and vomiting after your treatment, we encourage you to take your nausea medication    If you develop nausea and vomiting that is not controlled by your nausea medication, call the clinic. If it is after clinic hours your family physician or the after hours number for the clinic or go to the Emergency Department.   BELOW ARE SYMPTOMS THAT SHOULD BE REPORTED IMMEDIATELY:  *FEVER GREATER THAN 101.0 F  *CHILLS WITH OR WITHOUT FEVER  NAUSEA AND VOMITING THAT IS NOT CONTROLLED WITH YOUR NAUSEA MEDICATION  *UNUSUAL SHORTNESS OF BREATH  *UNUSUAL BRUISING OR BLEEDING  TENDERNESS IN MOUTH AND THROAT WITH OR WITHOUT PRESENCE OF ULCERS  *URINARY PROBLEMS  *BOWEL PROBLEMS  UNUSUAL RASH Items with * indicate a potential emergency and should be followed up as soon as possible.  One of the nurses will contact you 24 hours after your treatment. Please let the nurse know about any problems that you may have experienced. Feel free to call the clinic you have any questions or concerns. The clinic phone number is [phone].   I have been informed and understand all the instructions given to me. I know to contact the clinic, my physician, or go to the Emergency Department if any problems should occur. I do not have any questions at this time, but understand that I may call the clinic during office hours or the Patient Navigator at [phone] should I have any questions or need assistance in obtaining follow up care.    __________________________________________  _____________  __________ Signature of Patient or Authorized Representative            Date                   Time    __________________________________________ Nurse's  Signature

## 2014-05-24 NOTE — Progress Notes (Signed)
Pt not treated today due to lab work.  Pt advised on avoiding crowds, good handwashing, fevers, wearing masking in public, and avoiding people that are sick.  Pt discharged ambulatory

## 2014-05-24 NOTE — Patient Instructions (Signed)
[place] at [facility]  Discharge Instructions:  We have you set up for CT scans in the next few weeks.  Return as scheduled for follow-up. _______________________________________________________________  Thank you for choosing [place] [facility] to provide your oncology and hematology care.  To afford each patient quality time with our providers, please arrive at least 15 minutes before your scheduled appointment.  You need to re-schedule your appointment if you arrive 10 or more minutes late.  We strive to give you quality time with our providers, and arriving late affects you and other patients whose appointments are after yours.  Also, if you no show three or more times for appointments you may be dismissed from the clinic.  Again, thank you for choosing [place] [facility] [place] hope is that these requests will allow you access to exceptional care and in a timely manner. _______________________________________________________________  If you have questions after your visit, please contact our office at [phone] between the hours of 8:30 a.m. and 5:00 p.m. Voicemails left after 4:30 p.m. will not be returned until the following business day. _______________________________________________________________  For prescription refill requests, have your pharmacy contact our office. _______________________________________________________________  Recommendations made by the consultant and any test results will be sent to your referring physician. _______________________________________________________________

## 2014-05-24 NOTE — Telephone Encounter (Signed)
"  What do I do with this contrast stuff?"  She cannot remember if it goes in the refrigerator or not.  She brings up a point, that I am not certain on, so I will request a nurse to call her tomorrow about that.  She also notes a nonproductive cough.  "I have been taking cough syrup and alka-seltzer and they ain't helping."  Since she has tried OTC medication to no avail, and she has no signs or symptoms of an infection, I will call in Tessalon for her.  This was [facility] to [facility].  Kizer Nobbe 05/24/2014 1815 hrs

## 2014-05-25 NOTE — Telephone Encounter (Signed)
Call to patient and instructions given regarding prep for scans.

## 2014-05-31 ENCOUNTER — Ambulatory Visit (INDEPENDENT_AMBULATORY_CARE_PROVIDER_SITE_OTHER): Payer: Medicare Other | Admitting: Family Medicine

## 2014-05-31 ENCOUNTER — Encounter: Payer: Self-pay | Admitting: Family Medicine

## 2014-05-31 VITALS — BP 132/68 | HR 56 | Temp 97.8°F | Resp 16 | Ht 63.5 in | Wt 158.0 lb

## 2014-05-31 DIAGNOSIS — C259 Malignant neoplasm of pancreas, unspecified: Secondary | ICD-10-CM | POA: Diagnosis not present

## 2014-05-31 DIAGNOSIS — J209 Acute bronchitis, unspecified: Secondary | ICD-10-CM | POA: Diagnosis not present

## 2014-05-31 DIAGNOSIS — C787 Secondary malignant neoplasm of liver and intrahepatic bile duct: Secondary | ICD-10-CM | POA: Diagnosis not present

## 2014-05-31 DIAGNOSIS — J301 Allergic rhinitis due to pollen: Secondary | ICD-10-CM | POA: Diagnosis not present

## 2014-05-31 DIAGNOSIS — I10 Essential (primary) hypertension: Secondary | ICD-10-CM

## 2014-05-31 MED ORDER — AZITHROMYCIN 250 MG PO TABS: ORAL_TABLET | ORAL | Status: DC

## 2014-05-31 MED ORDER — TRIAMCINOLONE ACETONIDE 55 MCG/ACT NA AERO: 2.0000 | INHALATION_SPRAY | Freq: Every day | NASAL | Status: DC

## 2014-05-31 NOTE — Progress Notes (Signed)
   Subjective:    Patient ID: [name], female    DOB: 1928-06-28, 81 y.o.   MRN: [id number]  HPI 1 week h/o excess nasal drainage mainly clear and chest congestion, was dry uip until this morning a bit green No current chills or fever is starting to feel better, but concerned that she will be too sick to get chemo Generally doing as well as can be expected as far as chemo, which is making her tired is concerned Hs excellent family suopport   Review of Systems See HPI  Denies chest pains, palpitations and leg swelling Denies abdominal pain, nausea, vomiting,diarrhea or constipation.   Denies dysuria, frequency, hesitancy or incontinence. Denies joint pain, swelling and limitation in mobility. Denies headaches, seizures, numbness, or tingling. Denies depression, anxiety or insomnia. Denies skin break down or rash.        Objective:   Physical Exam BP 132/68 mmHg  Pulse 56  Temp(Src) 97.8 F (36.6 C) (Oral)  Resp 16  Ht 5' 3.5" (1.613 m)  Wt 158 lb (71.668 kg)  BMI 27.55 kg/m2  SpO2 97% Patient alert and oriented and in no cardiopulmonary distress.  HEENT: No facial asymmetry, EOMI,   oropharynx pink and moist.  Neck supple no JVD, no mass.  Chest: adeqauate air entry, few scattered crackless in bases, no whees CVS: S1, S2 no murmurs, no S3.Regular rate.  ABD: Soft non tender.   Ext: No edema  MS: Adequate ROM spine, shoulders, hips and knees.  Skin: Intact, no ulcerations or rash noted.  Psych: Good eye contact, normal affect. Memory intact not anxious or depressed appearing.  CNS: CN 2-12 intact, power,  normal throughout.no focal deficits noted.        Assessment & Plan:  Acute bronchitis z pack prescribed and pt to take tessalon perles 1 daily for an additional 5 days   ALLERGIC RHINITIS, SEASONAL Uncontrolled, continue daily zyrtec and add nasocort   Essential hypertension Controlled, no change in medication    Pancreatic cancer  metastasized to liver Diagnosed in 2015, currently undergoing chemotherapy and tolerating well

## 2014-05-31 NOTE — Assessment & Plan Note (Signed)
Uncontrolled, continue daily zyrtec and add nasocort

## 2014-05-31 NOTE — Assessment & Plan Note (Signed)
Controlled, no change in medication  

## 2014-05-31 NOTE — Assessment & Plan Note (Signed)
z pack prescribed and pt to take tessalon perles 1 daily for an additional 5 days

## 2014-05-31 NOTE — Assessment & Plan Note (Addendum)
Diagnosed in 2015, currently undergoing chemotherapy and tolerating well

## 2014-05-31 NOTE — Patient Instructions (Signed)
F/u as before.  Start once daily nasal spray , flonase  For allerrgies  Antibiotic for 5 days is prescribed  Hope you feel better soon

## 2014-06-05 ENCOUNTER — Encounter (HOSPITAL_COMMUNITY): Payer: Self-pay

## 2014-06-05 ENCOUNTER — Other Ambulatory Visit: Payer: Self-pay

## 2014-06-05 ENCOUNTER — Emergency Department (HOSPITAL_COMMUNITY)
Admission: EM | Admit: 2014-06-05 | Discharge: 2014-06-05 | Disposition: A | Discharge status: Home / Self Care | Payer: Medicare Other | Attending: Emergency Medicine | Admitting: Emergency Medicine

## 2014-06-05 ENCOUNTER — Emergency Department (HOSPITAL_COMMUNITY): Payer: Medicare Other

## 2014-06-05 ENCOUNTER — Ambulatory Visit (HOSPITAL_COMMUNITY)
Admission: RE | Admit: 2014-06-05 | Discharge: 2014-06-05 | Disposition: A | Discharge status: Home / Self Care | Payer: Medicare Other | Source: Ambulatory Visit | Attending: Oncology | Admitting: Oncology

## 2014-06-05 DIAGNOSIS — F329 Major depressive disorder, single episode, unspecified: Secondary | ICD-10-CM | POA: Diagnosis not present

## 2014-06-05 DIAGNOSIS — Z8507 Personal history of malignant neoplasm of pancreas: Secondary | ICD-10-CM | POA: Insufficient documentation

## 2014-06-05 DIAGNOSIS — C252 Malignant neoplasm of tail of pancreas: Secondary | ICD-10-CM | POA: Diagnosis not present

## 2014-06-05 DIAGNOSIS — N200 Calculus of kidney: Secondary | ICD-10-CM | POA: Insufficient documentation

## 2014-06-05 DIAGNOSIS — T782XXA Anaphylactic shock, unspecified, initial encounter: Secondary | ICD-10-CM | POA: Diagnosis not present

## 2014-06-05 DIAGNOSIS — C787 Secondary malignant neoplasm of liver and intrahepatic bile duct: Secondary | ICD-10-CM | POA: Insufficient documentation

## 2014-06-05 DIAGNOSIS — C259 Malignant neoplasm of pancreas, unspecified: Secondary | ICD-10-CM | POA: Diagnosis not present

## 2014-06-05 DIAGNOSIS — Z79899 Other long term (current) drug therapy: Secondary | ICD-10-CM | POA: Diagnosis not present

## 2014-06-05 DIAGNOSIS — Z7982 Long term (current) use of aspirin: Secondary | ICD-10-CM | POA: Insufficient documentation

## 2014-06-05 DIAGNOSIS — N281 Cyst of kidney, acquired: Secondary | ICD-10-CM | POA: Diagnosis not present

## 2014-06-05 DIAGNOSIS — F419 Anxiety disorder, unspecified: Secondary | ICD-10-CM | POA: Diagnosis not present

## 2014-06-05 DIAGNOSIS — Z87738 Personal history of other specified (corrected) congenital malformations of digestive system: Secondary | ICD-10-CM | POA: Insufficient documentation

## 2014-06-05 DIAGNOSIS — Z87891 Personal history of nicotine dependence: Secondary | ICD-10-CM | POA: Diagnosis not present

## 2014-06-05 DIAGNOSIS — I2510 Atherosclerotic heart disease of native coronary artery without angina pectoris: Secondary | ICD-10-CM | POA: Diagnosis not present

## 2014-06-05 DIAGNOSIS — Z08 Encounter for follow-up examination after completed treatment for malignant neoplasm: Secondary | ICD-10-CM | POA: Diagnosis not present

## 2014-06-05 DIAGNOSIS — K219 Gastro-esophageal reflux disease without esophagitis: Secondary | ICD-10-CM | POA: Diagnosis not present

## 2014-06-05 DIAGNOSIS — I10 Essential (primary) hypertension: Secondary | ICD-10-CM | POA: Diagnosis not present

## 2014-06-05 DIAGNOSIS — Z8505 Personal history of malignant neoplasm of liver: Secondary | ICD-10-CM | POA: Insufficient documentation

## 2014-06-05 DIAGNOSIS — T508X5A Adverse effect of diagnostic agents, initial encounter: Secondary | ICD-10-CM | POA: Diagnosis not present

## 2014-06-05 DIAGNOSIS — Z87442 Personal history of urinary calculi: Secondary | ICD-10-CM | POA: Diagnosis not present

## 2014-06-05 DIAGNOSIS — Z91041 Radiographic dye allergy status: Secondary | ICD-10-CM

## 2014-06-05 DIAGNOSIS — X58XXXA Exposure to other specified factors, initial encounter: Secondary | ICD-10-CM | POA: Insufficient documentation

## 2014-06-05 DIAGNOSIS — Z86010 Personal history of colonic polyps: Secondary | ICD-10-CM | POA: Diagnosis not present

## 2014-06-05 DIAGNOSIS — T886XXA Anaphylactic reaction due to adverse effect of correct drug or medicament properly administered, initial encounter: Secondary | ICD-10-CM | POA: Insufficient documentation

## 2014-06-05 DIAGNOSIS — R062 Wheezing: Secondary | ICD-10-CM | POA: Diagnosis not present

## 2014-06-05 DIAGNOSIS — T7840XA Allergy, unspecified, initial encounter: Secondary | ICD-10-CM | POA: Diagnosis present

## 2014-06-05 DIAGNOSIS — Z880 Allergy status to penicillin: Secondary | ICD-10-CM | POA: Insufficient documentation

## 2014-06-05 MED ORDER — METHYLPREDNISOLONE SODIUM SUCC 125 MG IJ SOLR
125.0000 mg | Freq: Once | INTRAMUSCULAR | Status: AC
  Administered 2014-06-05: 125 mg via INTRAVENOUS
  Filled 2014-06-05: qty 2

## 2014-06-05 MED ORDER — ALBUTEROL SULFATE (2.5 MG/3ML) 0.083% IN NEBU
5.0000 mg | INHALATION_SOLUTION | Freq: Once | RESPIRATORY_TRACT | Status: AC
  Administered 2014-06-05: 5 mg via RESPIRATORY_TRACT
  Filled 2014-06-05: qty 6

## 2014-06-05 MED ORDER — IPRATROPIUM BROMIDE 0.02 % IN SOLN
0.5000 mg | Freq: Once | RESPIRATORY_TRACT | Status: AC
  Administered 2014-06-05: 0.5 mg via RESPIRATORY_TRACT
  Filled 2014-06-05: qty 2.5

## 2014-06-05 MED ORDER — DIPHENHYDRAMINE HCL 50 MG/ML IJ SOLN
25.0000 mg | Freq: Once | INTRAMUSCULAR | Status: AC
  Administered 2014-06-05: 25 mg via INTRAVENOUS
  Filled 2014-06-05: qty 1

## 2014-06-05 MED ORDER — SODIUM CHLORIDE 0.9 % IV SOLN
INTRAVENOUS | Status: DC
  Administered 2014-06-05: 500 mL via INTRAVENOUS

## 2014-06-05 MED ORDER — IOHEXOL 300 MG/ML  SOLN
80.0000 mL | Freq: Once | INTRAMUSCULAR | Status: AC | PRN
  Administered 2014-06-05: 80 mL via INTRAVENOUS

## 2014-06-05 MED ORDER — SODIUM CHLORIDE 0.9 % IV BOLUS (SEPSIS)
500.0000 mL | Freq: Once | INTRAVENOUS | Status: AC
  Administered 2014-06-05: 500 mL via INTRAVENOUS

## 2014-06-05 MED ORDER — FAMOTIDINE 20 MG PO TABS: 20.0000 mg | ORAL_TABLET | Freq: Two times a day (BID) | ORAL | Status: DC

## 2014-06-05 MED ORDER — FAMOTIDINE IN NACL 20-0.9 MG/50ML-% IV SOLN
20.0000 mg | Freq: Once | INTRAVENOUS | Status: AC
  Administered 2014-06-05: 20 mg via INTRAVENOUS
  Filled 2014-06-05: qty 50

## 2014-06-05 MED ORDER — PREDNISONE 20 MG PO TABS: 20.0000 mg | ORAL_TABLET | Freq: Two times a day (BID) | ORAL | Status: DC

## 2014-06-05 NOTE — ED Notes (Signed)
Pt brought here from radiology. States pt haas a procedure done that required IVP dye. Pt wheezing and vomiting

## 2014-06-05 NOTE — ED Notes (Signed)
RT at bedside.

## 2014-06-05 NOTE — ED Notes (Signed)
Discharge instructions reviewed with pt, questions answered. Pt verbalized understanding.  

## 2014-06-05 NOTE — ED Notes (Signed)
Pt denies SOB, states nausea relieved.

## 2014-06-05 NOTE — Progress Notes (Signed)
No answer at patient's home. Will be here on 06/07/2014 for exam.

## 2014-06-05 NOTE — Discharge Instructions (Signed)
You are allergic to IV contrast dye. Do not take this again, if you are having a CT scan. Continue your treatment, by taking your Zyrtec, once a day for at least a week. Also, take the prescriptions for Pepcid, and prednisone as prescribed. See your doctor in one week for a checkup. Return here if your symptoms return or you began to be uncomfortable in any other way.   Anaphylactic Reaction An anaphylactic reaction is a sudden, severe allergic reaction that involves the whole body. It can be life threatening. A hospital stay is often required. People with asthma, eczema, or hay fever are slightly more likely to have an anaphylactic reaction. CAUSES  An anaphylactic reaction may be caused by anything to which you are allergic. After being exposed to the allergic substance, your immune system becomes sensitized to it. When you are exposed to that allergic substance again, an allergic reaction can occur. Common causes of an anaphylactic reaction include:  Medicines.  Foods, especially peanuts, wheat, shellfish, milk, and eggs.  Insect bites or stings.  Blood products.  Chemicals, such as dyes, latex, and contrast material used for imaging tests. SYMPTOMS  When an allergic reaction occurs, the body releases histamine and other substances. These substances cause symptoms such as tightening of the airway. Symptoms often develop within seconds or minutes of exposure. Symptoms may include:  Skin rash or hives.  Itching.  Chest tightness.  Swelling of the eyes, tongue, or lips.  Trouble breathing or swallowing.  Lightheadedness or fainting.  Anxiety or confusion.  Stomach pains, vomiting, or diarrhea.  Nasal congestion.  A fast or irregular heartbeat (palpitations). DIAGNOSIS  Diagnosis is based on your history of recent exposure to allergic substances, your symptoms, and a physical exam. Your caregiver may also perform blood or urine tests to confirm the diagnosis. TREATMENT    Epinephrine medicine is the main treatment for an anaphylactic reaction. Other medicines that may be used for treatment include antihistamines, steroids, and albuterol. In severe cases, fluids and medicine to support blood pressure may be given through an intravenous line (IV). Even if you improve after treatment, you need to be observed to make sure your condition does not get worse. This may require a stay in the hospital. [place] a medical alert bracelet or necklace stating your allergy.  You and your family must learn how to use an anaphylaxis kit or give an epinephrine injection to temporarily treat an emergency allergic reaction. Always carry your epinephrine injection or anaphylaxis kit with you. This can be lifesaving if you have a severe reaction.  Do not drive or perform tasks after treatment until the medicines used to treat your reaction have worn off, or until your caregiver says it is okay.  If you have hives or a rash:  Take medicines as directed by your caregiver.  You may use an over-the-counter antihistamine (diphenhydramine) as needed.  Apply cold compresses to the skin or take baths in cool water. Avoid hot baths or showers. SEEK MEDICAL CARE IF:   You develop symptoms of an allergic reaction to a new substance. Symptoms may start right away or minutes later.  You develop a rash, hives, or itching.  You develop new symptoms. SEEK IMMEDIATE MEDICAL CARE IF:   You have swelling of the mouth, difficulty breathing, or wheezing.  You have a tight feeling in your chest or throat.  You develop hives, swelling, or itching all over your body.  You develop severe vomiting or  diarrhea.  You feel faint or pass out. This is an emergency. Use your epinephrine injection or anaphylaxis kit as you have been instructed. Call your local emergency services (911 in [place] Even if you improve after the injection, you need to be examined at a hospital emergency  department. MAKE SURE YOU:   Understand these instructions.  Will watch your condition.  Will get help right away if you are not doing well or get worse. Document Released: 04/28/2005 Document Revised: 05/03/2013 Document Reviewed: 07/30/2011 [facility] Patient Information 2015 [place], [place]. This information is not intended to replace advice given to you by your health care provider. Make sure you discuss any questions you have with your health care provider.

## 2014-06-05 NOTE — ED Provider Notes (Signed)
CSN: [id number]     Arrival date & time 06/05/14  1001 History  This chart was scribed for Richarda Blade, MD by Edison Simon, ED Scribe. This patient was seen in room APA03/APA03 and the patient's care was started at 10:05 AM.   Chief Complaint  Patient presents with  . Allergic Reaction   The history is provided by the patient. No language interpreter was used.    HPI Comments: [name] is a 81 y.o. female who presents to the Emergency Department complaining of allergic reaction to IVP dye with vomiting, wheezing, and hives on her face with onset just PTA, brought here from radiology. She was getting ready to have CT of her abdomen/chest.   Past Medical History  Diagnosis Date  . Hypertension 20 years   . Chronic diarrhea   . Anxiety   . Depression   . Kidney stone     hx/ crushed   . Hx of adenomatous colonic polyps     tubular adenomas, last found in 2008  . GERD (gastroesophageal reflux disease)   . Erosive esophagitis   . Schatzki's ring 08/26/10    Last dilated on EGD by Dr. Trevor I[facility], linear gastric erosions, BI hemigastrectomy  . Hyperplastic colon polyp 03/19/10    tcs by Dr. Gala Romney  . Pancreatic cancer 02/2014  . Pancreatic cancer metastasized to liver 03/10/2014   Past Surgical History  Procedure Laterality Date  . Stomach ulcer  50 years ago     had some of her stomach removed   . Bunion removal      from both feet   . Left shoulder surgery  2009    DR HARRISON  . Colonoscopy  03/19/2010    DR Gala Romney,, normal TI, pancolonic diverticula, random colon bx neg., hyperplastic polyps removed  . Esophagogastroduodenoscopy  11/22/2003    DR Gala Romney, erosive RE, Billroth I  . Esophagogastroduodenoscopy  08/26/10    Dr. Gala Romney- moderate severe ERE, Scahtzki ring s/p dilation, Billroth I, linear gastric erosions, bx-gastric xanthelasma  . Breast lumpectomy Left   . Orif ankle fracture Right 05/22/2013    Procedure: OPEN REDUCTION INTERNAL FIXATION (ORIF) RIGHT  ANKLE FRACTURE;  Surgeon: Sanjuana Kava, MD;  Location: [facility];  Service: Orthopedics;  Laterality: Right;  . Cholecystectomy  1965    Family History  Problem Relation Age of Onset  . Hypertension Mother   . Kidney failure Brother     X1 ON DIALYSIS  . Diabetes Sister   . Cancer Sister     X2  . Hypertension Father   . Liver disease Neg Hx   . Colon cancer Neg Hx    History  Substance Use Topics  . Smoking status: Former Smoker    Types: Cigarettes    Quit date: 11/09/2011  . Smokeless tobacco: Never Used     Comment: quit a few weeks ago   . Alcohol Use: No   OB History    No data available     Review of Systems  Respiratory: Positive for wheezing.   Gastrointestinal: Positive for vomiting.  Allergic/Immunologic:       Allergy to IVP dye  All other systems reviewed and are negative.     Allergies  Iohexol; Aciphex; Amlodipine besylate-valsartan; Esomeprazole magnesium; Omeprazole; Penicillins; and Ciprofloxacin  Home Medications   Prior to Admission medications   Medication Sig Start Date End Date Taking? Authorizing Provider  benzonatate (TESSALON) 200 MG capsule Take 1 capsule (200 mg total) by mouth 3 (  three) times daily as needed for cough. 05/24/14  Yes Manon Hilding Kefalas, PA-C  amLODipine-olmesartan (AZOR) 10-40 MG per tablet Take 1 tablet by mouth daily. Patient taking differently: Take 1 tablet by mouth daily. [facility] nighttime cough syrup 05/03/14   Fayrene Helper, MD  aspirin 81 MG EC tablet Take 81 mg by mouth every morning.     Historical Provider, MD  azithromycin (ZITHROMAX) 250 MG tablet Two tablets on day one , then one tablet once daily for four days 05/31/14   Fayrene Helper, MD  BYSTOLIC 10 MG tablet TAKE 1 TABLET DAILY 03/20/14   Fayrene Helper, MD  cetirizine (ZYRTEC) 10 MG tablet Take 1 tablet (10 mg total) by mouth every morning. 05/03/14   Fayrene Helper, MD  cholestyramine light (PREVALITE) 4 GM/DOSE powder 1 scoop daily  02/07/14   Fayrene Helper, MD  clonazePAM (KLONOPIN) 0.5 MG tablet Take 0.5 mg by mouth 2 (two) times daily as needed for anxiety.     Historical Provider, MD  cycloSPORINE (RESTASIS) 0.05 % ophthalmic emulsion Place 1 drop into both eyes 2 (two) times daily.    Historical Provider, MD  diclofenac sodium (VOLTAREN) 1 % GEL Apply  daily, as needed, to lower back, for uncontrolled pain Patient taking differently: Apply 2-4 g topically daily as needed (lower bck for uncontrolled pain.).  02/16/14   Fayrene Helper, MD  famotidine (PEPCID) 20 MG tablet Take 1 tablet (20 mg total) by mouth 2 (two) times daily. 06/05/14   Richarda Blade, MD  ferrous sulfate 325 (65 FE) MG tablet Take 325 mg by mouth daily with breakfast.    Historical Provider, MD  HYDROcodone-acetaminophen (NORCO) 7.5-325 MG per tablet Take 1 tablet by mouth every 4 (four) hours as needed for moderate pain. 05/03/14   Molli Hazard, MD  lidocaine-prilocaine (EMLA) cream Apply a quarter size amount to port site 1 hour prior to chemo. Do not rub in. Cover with plastic wrap. 03/13/14   Farrel Gobble, MD  OVER THE COUNTER MEDICATION     Historical Provider, MD  pantoprazole (PROTONIX) 20 MG tablet TAKE 1 TABLET DAILY 04/25/14   Fayrene Helper, MD  predniSONE (DELTASONE) 20 MG tablet Take 1 tablet (20 mg total) by mouth 2 (two) times daily. 06/05/14   Richarda Blade, MD  Probiotic Product (HEALTHY COLON PO) Take 1 tablet by mouth every morning.     Historical Provider, MD  prochlorperazine (COMPAZINE) 10 MG tablet Take 1 tablet (10 mg total) by mouth every 6 (six) hours as needed for nausea or vomiting. 03/13/14   Farrel Gobble, MD  senna (SENOKOT) 8.6 MG tablet Take 1 tablet by mouth daily.    Historical Provider, MD  triamcinolone (NASACORT ALLERGY 24HR) 55 MCG/ACT AERO nasal inhaler Place 2 sprays into the nose daily. 05/31/14   Fayrene Helper, MD   BP 100/69 mmHg  Pulse 69  Temp(Src) 98.3 F (36.8 C) (Oral)   Resp 23  SpO2 94% Physical Exam  Constitutional: She is oriented to person, place, and time. She appears well-developed and well-nourished.  HENT:  Head: Normocephalic and atraumatic.  Eyes: Conjunctivae and EOM are normal. Pupils are equal, round, and reactive to light.  Neck: Normal range of motion and phonation normal. Neck supple.  Cardiovascular: Normal rate and regular rhythm.   Pulmonary/Chest: Effort normal. Tachypnea noted. She has wheezes (generalized). She exhibits no tenderness.  Increased work of breathing  Abdominal: Soft. She exhibits no distension. There  is no tenderness. There is no guarding.  Musculoskeletal: Normal range of motion.  Neurological: She is alert and oriented to person, place, and time. She exhibits normal muscle tone.  Skin: Skin is warm and dry.  Psychiatric: She has a normal mood and affect. Her behavior is normal. Judgment and thought content normal.  Nursing note and vitals reviewed.   ED Course  Procedures (including critical care time)  DIAGNOSTIC STUDIES: Oxygen Saturation is 93% on room air, adequate by my interpretation.    COORDINATION OF CARE: 10:13 AM Discussed treatment plan with patient at beside, the patient agrees with the plan and has no further questions at this time.  Medications  0.9 %  sodium chloride infusion ( Intravenous Stopped 06/05/14 1359)  methylPREDNISolone sodium succinate (SOLU-MEDROL) 125 mg/2 mL injection 125 mg (125 mg Intravenous Given 06/05/14 1014)  famotidine (PEPCID) IVPB 20 mg (0 mg Intravenous Stopped 06/05/14 1044)  diphenhydrAMINE (BENADRYL) injection 25 mg (25 mg Intravenous Given 06/05/14 1015)  albuterol (PROVENTIL) (2.5 MG/3ML) 0.083% nebulizer solution 5 mg (5 mg Nebulization Given 06/05/14 1020)  ipratropium (ATROVENT) nebulizer solution 0.5 mg (0.5 mg Nebulization Given 06/05/14 1020)  sodium chloride 0.9 % bolus 500 mL (0 mLs Intravenous Stopped 06/05/14 1113)    Patient Vitals for the past 24 hrs:   BP Temp Temp src Pulse Resp SpO2  06/05/14 1330 100/69 mmHg - - 69 23 94 %  06/05/14 1315 (!) 128/54 mmHg - - 71 15 96 %  06/05/14 1300 (!) 115/101 mmHg - - 73 14 95 %  06/05/14 1245 137/78 mmHg - - 69 16 96 %  06/05/14 1230 134/67 mmHg - - 64 18 99 %  06/05/14 1220 141/71 mmHg - - 64 17 99 %  06/05/14 1215 133/70 mmHg - - 64 15 100 %  06/05/14 1210 145/70 mmHg - - 64 17 99 %  06/05/14 1205 143/64 mmHg - - 66 15 98 %  06/05/14 1200 141/66 mmHg - - 65 17 100 %  06/05/14 1155 136/76 mmHg - - 64 13 99 %  06/05/14 1150 141/64 mmHg - - 64 14 100 %  06/05/14 1145 140/65 mmHg - - 65 17 100 %  06/05/14 1140 153/75 mmHg - - 65 21 100 %  06/05/14 1135 137/91 mmHg - - 65 19 100 %  06/05/14 1130 143/69 mmHg - - 67 14 100 %  06/05/14 1125 147/67 mmHg - - 67 13 100 %  06/05/14 1120 146/71 mmHg - - 64 16 100 %  06/05/14 1115 151/67 mmHg - - 68 20 100 %  06/05/14 1110 151/71 mmHg - - 65 20 100 %  06/05/14 1105 158/68 mmHg - - 66 20 100 %  06/05/14 1100 159/70 mmHg - - 67 18 100 %  06/05/14 1055 149/67 mmHg - - 65 22 100 %  06/05/14 1050 157/71 mmHg - - 66 24 100 %  06/05/14 1047 163/70 mmHg - - 67 24 100 %  06/05/14 1030 148/64 mmHg - - 66 (!) 28 100 %  06/05/14 1021 - - - - - 96 %  06/05/14 1014 - 98.3 F (36.8 C) Oral - - -  06/05/14 1012 - - - 74 - 96 %  06/05/14 1005 (!) 205/95 mmHg - - - - -    1:20 PM Reevaluation with update and discussion. After initial assessment and treatment, an updated evaluation reveals facial rash is improved.  She states her breathing problem has resolved.  She feels comfortable and is  ready to go home.  She states that her doctor recently gave her a prescription for something for allergies. La Shehan L   CRITICAL CARE Performed by: Daleen Bo L Total critical care time: 40 minutes Critical care time was exclusive of separately billable procedures and treating other patients. Critical care was necessary to treat or prevent imminent or life-threatening  deterioration. Critical care was time spent personally by me on the following activities: development of treatment plan with patient and/or surrogate as well as nursing, discussions with consultants, evaluation of patient's response to treatment, examination of patient, obtaining history from patient or surrogate, ordering and performing treatments and interventions, ordering and review of laboratory studies, ordering and review of radiographic studies, pulse oximetry and re-evaluation of patient's condition.  Labs Review Labs Reviewed - No data to display  Imaging Review Ct Chest W Contrast  06/05/2014   CLINICAL DATA:  Metastatic pancreatic cancer.  EXAM: CT CHEST, ABDOMEN, AND PELVIS WITH CONTRAST  TECHNIQUE: Multidetector CT imaging of the chest, abdomen and pelvis was performed following the standard protocol during bolus administration of intravenous contrast.  CONTRAST:  15mL OMNIPAQUE IOHEXOL 300 MG/ML  SOLN  COMPARISON:  MRI abdomen 03/02/2014  FINDINGS: CT CHEST FINDINGS  Chest wall: No breast masses, supraclavicular or axillary lymphadenopathy. The thyroid gland is grossly normal. Thyroid goiter. The right-sided Port-A-Cath is in good position. No complicating features. The bony thorax is intact. No destructive bone lesions or spinal canal compromise. No obvious sternal or rib lesions.  Mediastinum: The heart is mildly enlarged but stable. No pericardial effusion. No mediastinal or hilar mass or adenopathy. The aorta demonstrates moderate atherosclerotic calcifications but no focal aneurysm or dissection. Three-vessel coronary artery calcifications are noted. The distal esophagus is grossly normal.  Lungs: Mild emphysematous changes and moderate pulmonary scarring. No acute pulmonary findings or pleural effusion. No worrisome pulmonary nodules to suggest metastatic disease. Biapical pleural and parenchymal scarring changes are noted.  CT ABDOMEN AND PELVIS FINDINGS  Hepatobiliary: The dominant  right hepatic lobe lesion near the caudate lobe measures a maximum of 17 mm. This measured 25 mm on the prior MRI. The segment 8 lesion measures approximately 87 mm and was previously 13 mm. No other definite hepatic lesions are demonstrated. Small calcified granulomas are noted. There is mild persistent intra and extrahepatic biliary dilatation likely due to prior cholecystectomy.  Pancreas: No pancreatic head mass or pancreatic duct dilatation. The mass in the tail of the pancreas measures approximately 27 x 26 mm. This measured approximately 35 x 30 mm on the prior MRI.  Spleen: The pancreatic tail lesion appears to invade/involve the splenic hilum. No separate lesions are seen.  Adrenals/Urinary Tract: The adrenal glands and kidneys are stable. There is a right renal calculus and bilateral renal cysts. There is a small hyperdense/ hemorrhagic cyst involving the left kidney which is stable.  Stomach/Bowel: The stomach, duodenum, small bowel and colon are unremarkable. No inflammatory changes, mass lesions or obstructive findings.  Vascular/Lymphatic: No mesenteric or retroperitoneal mass or adenopathy. Advanced atherosclerotic calcifications involving the aorta and branch vessels. The major venous structures are patent including the splenic vein.  Reproductive: The uterus and ovaries are normal. The bladder is normal. No pelvic mass or adenopathy. No free pelvic fluid collections. No inguinal mass or adenopathy. Small scattered lymph nodes are noted.  Other: No abdominal wall hernia or subcutaneous lesions.  Musculoskeletal: No significant bony findings.  IMPRESSION: 1. Interval decrease in size of the pancreatic tail mass. 2. Improved hepatic metastatic disease.  No new lesions. 3. No CT findings for metastatic disease involving the chest. 4. Advanced atherosclerotic calcifications involving the thoracic and abdominal aorta and branch vessels. 5. Stable renal cysts and right renal calculus.   Electronically  Signed   By: Kalman Jewels M.D.   On: 06/05/2014 10:41   Ct Abdomen Pelvis W Contrast  06/05/2014   CLINICAL DATA:  Metastatic pancreatic cancer.  EXAM: CT CHEST, ABDOMEN, AND PELVIS WITH CONTRAST  TECHNIQUE: Multidetector CT imaging of the chest, abdomen and pelvis was performed following the standard protocol during bolus administration of intravenous contrast.  CONTRAST:  74mL OMNIPAQUE IOHEXOL 300 MG/ML  SOLN  COMPARISON:  MRI abdomen 03/02/2014  FINDINGS: CT CHEST FINDINGS  Chest wall: No breast masses, supraclavicular or axillary lymphadenopathy. The thyroid gland is grossly normal. Thyroid goiter. The right-sided Port-A-Cath is in good position. No complicating features. The bony thorax is intact. No destructive bone lesions or spinal canal compromise. No obvious sternal or rib lesions.  Mediastinum: The heart is mildly enlarged but stable. No pericardial effusion. No mediastinal or hilar mass or adenopathy. The aorta demonstrates moderate atherosclerotic calcifications but no focal aneurysm or dissection. Three-vessel coronary artery calcifications are noted. The distal esophagus is grossly normal.  Lungs: Mild emphysematous changes and moderate pulmonary scarring. No acute pulmonary findings or pleural effusion. No worrisome pulmonary nodules to suggest metastatic disease. Biapical pleural and parenchymal scarring changes are noted.  CT ABDOMEN AND PELVIS FINDINGS  Hepatobiliary: The dominant right hepatic lobe lesion near the caudate lobe measures a maximum of 17 mm. This measured 25 mm on the prior MRI. The segment 8 lesion measures approximately 87 mm and was previously 13 mm. No other definite hepatic lesions are demonstrated. Small calcified granulomas are noted. There is mild persistent intra and extrahepatic biliary dilatation likely due to prior cholecystectomy.  Pancreas: No pancreatic head mass or pancreatic duct dilatation. The mass in the tail of the pancreas measures approximately 27 x 26  mm. This measured approximately 35 x 30 mm on the prior MRI.  Spleen: The pancreatic tail lesion appears to invade/involve the splenic hilum. No separate lesions are seen.  Adrenals/Urinary Tract: The adrenal glands and kidneys are stable. There is a right renal calculus and bilateral renal cysts. There is a small hyperdense/ hemorrhagic cyst involving the left kidney which is stable.  Stomach/Bowel: The stomach, duodenum, small bowel and colon are unremarkable. No inflammatory changes, mass lesions or obstructive findings.  Vascular/Lymphatic: No mesenteric or retroperitoneal mass or adenopathy. Advanced atherosclerotic calcifications involving the aorta and branch vessels. The major venous structures are patent including the splenic vein.  Reproductive: The uterus and ovaries are normal. The bladder is normal. No pelvic mass or adenopathy. No free pelvic fluid collections. No inguinal mass or adenopathy. Small scattered lymph nodes are noted.  Other: No abdominal wall hernia or subcutaneous lesions.  Musculoskeletal: No significant bony findings.  IMPRESSION: 1. Interval decrease in size of the pancreatic tail mass. 2. Improved hepatic metastatic disease.  No new lesions. 3. No CT findings for metastatic disease involving the chest. 4. Advanced atherosclerotic calcifications involving the thoracic and abdominal aorta and branch vessels. 5. Stable renal cysts and right renal calculus.   Electronically Signed   By: Kalman Jewels M.D.   On: 06/05/2014 10:41   Dg Chest Port 1 View  06/05/2014   CLINICAL DATA:  Vomiting, wheezing and urticaria after intravenous injection of Omnipaque 300  EXAM: PORTABLE CHEST - 1 VIEW  COMPARISON:  05/22/2013  FINDINGS: There is a right jugular venous port with tip in the low SVC.  The lungs are clear. Hilar, mediastinal and cardiac contours appear unremarkable. No large effusion or pneumothorax is evident on this single portable supine radiograph.  IMPRESSION: No acute findings    Electronically Signed   By: Andreas Newport M.D.   On: 06/05/2014 10:55     EKG Interpretation None        Date: 06/05/14- MUSE hyperlink is inactive  Rate: 74  Rhythm: normal sinus rhythm  QRS Axis: normal  PR and QT Intervals: normal  ST/T Wave abnormalities: normal  PR and QRS Conduction Disutrbances:none  Narrative Interpretation:   Old EKG Reviewed: unchanged   MDM   Final diagnoses:  Anaphylaxis, initial encounter  Contrast media allergy    Anaphylaxis, related to administration of IV contrast dye.  Patient improved with treatment in emergency department.  Blood pressure remained normal during observation.  No additional treatment required after first treatment.  Nursing Notes Reviewed/ Care Coordinated Applicable Imaging Reviewed Interpretation of Laboratory Data incorporated into ED treatment  The patient appears reasonably screened and/or stabilized for discharge and I doubt any other medical condition or other [facility] requiring further screening, evaluation, or treatment in the ED at this time prior to discharge.  Plan: Home Medications- Prednisone, Pepcid, Benadryl; Home Treatments- rest; return here if the recommended treatment, does not improve the symptoms; Recommended follow up- PCP and oncology 1 week   I personally performed the services described in this documentation, which was scribed in my presence. The recorded information has been reviewed and is accurate.     Richarda Blade, MD 06/05/14 [phone]

## 2014-06-05 NOTE — ED Notes (Signed)
Cleaned pt after having incontinent episode of BM x1.

## 2014-06-07 ENCOUNTER — Encounter (HOSPITAL_COMMUNITY): Payer: Self-pay | Admitting: Hematology & Oncology

## 2014-06-07 ENCOUNTER — Encounter (HOSPITAL_BASED_OUTPATIENT_CLINIC_OR_DEPARTMENT_OTHER): Payer: Medicare Other | Admitting: Hematology & Oncology

## 2014-06-07 VITALS — BP 154/86 | HR 64 | Temp 97.4°F | Resp 18 | Wt 159.1 lb

## 2014-06-07 DIAGNOSIS — C787 Secondary malignant neoplasm of liver and intrahepatic bile duct: Secondary | ICD-10-CM | POA: Diagnosis not present

## 2014-06-07 DIAGNOSIS — C259 Malignant neoplasm of pancreas, unspecified: Secondary | ICD-10-CM | POA: Diagnosis not present

## 2014-06-07 NOTE — Patient Instructions (Signed)
[name] at [facility]  Discharge Instructions: Please call if you have any problems or concerns prior to your next follow-up We will see you back on February 3rd with labs and chemotherapy _______________________________________________________________  Thank you for choosing [place] [facility] to provide your oncology and hematology care.  To afford each patient quality time with our providers, please arrive at least 15 minutes before your scheduled appointment.  You need to re-schedule your appointment if you arrive 10 or more minutes late.  We strive to give you quality time with our providers, and arriving late affects you and other patients whose appointments are after yours.  Also, if you no show three or more times for appointments you may be dismissed from the clinic.  Again, thank you for choosing [place] [facility] [place] hope is that these requests will allow you access to exceptional care and in a timely manner. _______________________________________________________________  If you have questions after your visit, please contact our office at [phone] between the hours of 8:30 a.m. and 5:00 p.m. Voicemails left after 4:30 p.m. will not be returned until the following business day. _______________________________________________________________  For prescription refill requests, have your pharmacy contact our office. _______________________________________________________________  Recommendations made by the consultant and any test results will be sent to your referring physician. _______________________________________________________________

## 2014-06-07 NOTE — Progress Notes (Signed)
[name] Nakayama, MD [address] [place] [address]   DIAGNOSIS: Pancreatic cancer metastasized to liver   Staging form: Pancreas, AJCC 7th Edition     Clinical: Stage IV (T3, N1, M1) - Signed by Baird Cancer, PA-C on 03/16/2014     Pathologic: No stage assigned - Unsigned  CONTRAST MEDIA ALLERGY  SUMMARY OF ONCOLOGIC HISTORY:   Pancreatic cancer metastasized to liver   03/10/2014 Initial Diagnosis Pancreatic cancer metastasized to liver   03/22/2014 -  Chemotherapy Abraxane/Gemzar days 1, 8, every 28 days.  Day 15 was cancelled due to leukopenia and thrombocytopenia on day 15 cycle 1.   05/24/2014 Treatment Plan Change Day 8 of cycle 3 is held with ANC of 1.1    CURRENT THERAPY: Gemzar  INTERVAL HISTORY: [name] 81 y.o. female returns for follow-up of her pancreatic cancer. She is doing well. She had a reaction when she took her last scan. She reports facial swelling, nausea and vomiting.  She was started on prednisone/pepcid. She has been documented as having an anaphylaxic reaction.   MEDICAL HISTORY: Past Medical History  Diagnosis Date  . Hypertension 20 years   . Chronic diarrhea   . Anxiety   . Depression   . Kidney stone     hx/ crushed   . Hx of adenomatous colonic polyps     tubular adenomas, last found in 2008  . GERD (gastroesophageal reflux disease)   . Erosive esophagitis   . Schatzki's ring 08/26/10    Last dilated on EGD by Dr. Trevor [facility], linear gastric erosions, BI hemigastrectomy  . Hyperplastic colon polyp 03/19/10    tcs by Dr. Gala Romney  . Pancreatic cancer 02/2014  . Pancreatic cancer metastasized to liver 03/10/2014    has Essential hypertension; OSTEOARTHROSIS, LOCAL, PRIMARY, SHOULDER; DEGENERATIVE JOINT DISEASE, CERVICAL SPINE; RUPTURE ROTATOR CUFF; ALLERGIC RHINITIS, SEASONAL; GERD (gastroesophageal reflux disease); Anxiety; Primary generalized (osteo)arthritis; Back pain with radiation; Weakness of left leg;  Difficulty in walking; Routine general medical examination at a health care facility; IGT (impaired glucose tolerance); Fracture of right ankle; Encounter for annual physical exam; Anemia, iron deficiency; Elevated LFTs; Change in stool habits; Back pain without sciatica; Abnormal liver ultrasound; Pancreatic cancer metastasized to liver; Liver metastases; and Acute bronchitis on her problem list.     is allergic to iohexol; aciphex; amlodipine besylate-valsartan; esomeprazole magnesium; omeprazole; penicillins; and ciprofloxacin.  [name] does not currently have medications on file.  SURGICAL HISTORY: Past Surgical History  Procedure Laterality Date  . Stomach ulcer  50 years ago     had some of her stomach removed   . Bunion removal      from both feet   . Left shoulder surgery  2009    DR HARRISON  . Colonoscopy  03/19/2010    DR Gala Romney,, normal TI, pancolonic diverticula, random colon bx neg., hyperplastic polyps removed  . Esophagogastroduodenoscopy  11/22/2003    DR Gala Romney, erosive RE, Billroth I  . Esophagogastroduodenoscopy  08/26/10    Dr. Gala Romney- moderate severe ERE, Scahtzki ring s/p dilation, Billroth I, linear gastric erosions, bx-gastric xanthelasma  . Breast lumpectomy Left   . Orif ankle fracture Right 05/22/2013    Procedure: OPEN REDUCTION INTERNAL FIXATION (ORIF) RIGHT ANKLE FRACTURE;  Surgeon: Sanjuana Kava, MD;  Location: [facility];  Service: Orthopedics;  Laterality: Right;  . Cholecystectomy  1965     SOCIAL HISTORY: History   Social History  . Marital Status: Widowed  Spouse Name: N/A    Number of Children: 2  . Years of Education: N/A   Occupational History  . retired from Teacher, adult education    Social History Main Topics  . Smoking status: Former Smoker    Types: Cigarettes    Quit date: 11/09/2011  . Smokeless tobacco: Never Used     Comment: quit a few weeks ago   . Alcohol Use: No  . Drug Use: No  . Sexual Activity: No   Other Topics Concern  . Not on file    Social History Narrative    FAMILY HISTORY: Family History  Problem Relation Age of Onset  . Hypertension Mother   . Kidney failure Brother     X1 ON DIALYSIS  . Diabetes Sister   . Cancer Sister     X2  . Hypertension Father   . Liver disease Neg Hx   . Colon cancer Neg Hx     Review of Systems  Constitutional: Negative for fever, chills, weight loss and malaise/fatigue.  HENT: Negative for congestion, hearing loss, nosebleeds, sore throat and tinnitus.   Eyes: Negative for blurred vision, double vision, pain and discharge.  Respiratory: Negative for cough, hemoptysis, sputum production, shortness of breath and wheezing.   Cardiovascular: Negative for chest pain, palpitations, claudication, leg swelling and PND.  Gastrointestinal: Negative for heartburn, nausea, vomiting, abdominal pain, diarrhea, constipation, blood in stool and melena.  Genitourinary: Negative for dysuria, urgency, frequency and hematuria.  Musculoskeletal: Negative for myalgias, joint pain and falls.  Skin: Negative for itching and rash.  Neurological: Negative for dizziness, tingling, tremors, sensory change, speech change, focal weakness, seizures, loss of consciousness, weakness and headaches.  Endo/Heme/Allergies: Does not bruise/bleed easily.  Psychiatric/Behavioral: Negative for depression, suicidal ideas, memory loss and substance abuse. The patient is not nervous/anxious and does not have insomnia.     PHYSICAL EXAMINATION  ECOG PERFORMANCE STATUS: 0 - Asymptomatic  Filed Vitals:   06/07/14 1138  BP: 154/86  Pulse: 64  Temp: 97.4 F (36.3 C)  Resp: 18    Physical Exam  Constitutional: She is oriented to person, place, and time and well-developed, well-nourished, and in no distress.  Wearing hair prosthesis, well groomed  HENT:  Head: Normocephalic and atraumatic.  Nose: Nose normal.  Mouth/Throat: Oropharynx is clear and moist. No oropharyngeal exudate.  Eyes: Conjunctivae and EOM  are normal. Pupils are equal, round, and reactive to light. Right eye exhibits no discharge. Left eye exhibits no discharge. No scleral icterus.  Neck: Normal range of motion. Neck supple. No tracheal deviation present. No thyromegaly present.  Cardiovascular: Normal rate, regular rhythm and normal heart sounds.  Exam reveals no gallop and no friction rub.   No murmur heard. Pulmonary/Chest: Effort normal and breath sounds normal. She has no wheezes. She has no rales.  Abdominal: Soft. Bowel sounds are normal. She exhibits no distension and no mass. There is no tenderness. There is no rebound and no guarding.  Musculoskeletal: Normal range of motion. She exhibits no edema.  Lymphadenopathy:    She has no cervical adenopathy.  Neurological: She is alert and oriented to person, place, and time. She has normal reflexes. No cranial nerve deficit. Gait normal. Coordination normal.  Skin: Skin is warm and dry. No rash noted.  Psychiatric: Mood, memory, affect and judgment normal.  Nursing note and vitals reviewed.   LABORATORY DATA:  CBC    Component Value Date/Time   WBC 2.6* 05/24/2014 1010   RBC 3.43* 05/24/2014 1010  HGB 9.8* 05/24/2014 1010   HCT 29.3* 05/24/2014 1010   PLT 133* 05/24/2014 1010   MCV 85.4 05/24/2014 1010   MCH 28.6 05/24/2014 1010   MCHC 33.4 05/24/2014 1010   RDW 15.5 05/24/2014 1010   LYMPHSABS 1.2 05/24/2014 1010   MONOABS 0.2 05/24/2014 1010   EOSABS 0.1 05/24/2014 1010   BASOSABS 0.0 05/24/2014 1010   CMP     Component Value Date/Time   NA 143 05/17/2014 0932   K 4.0 05/17/2014 0932   CL 111 05/17/2014 0932   CO2 28 05/17/2014 0932   GLUCOSE 112* 05/17/2014 0932   BUN 20 05/17/2014 0932   CREATININE 1.24* 05/17/2014 0932   CREATININE 0.97 02/14/2014 0758   CALCIUM 8.9 05/17/2014 0932   PROT 6.9 05/17/2014 0932   ALBUMIN 3.9 05/17/2014 0932   AST 17 05/17/2014 0932   ALT 15 05/17/2014 0932   ALKPHOS 99 05/17/2014 0932   BILITOT 0.7 05/17/2014  0932   GFRNONAA 39* 05/17/2014 0932   GFRNONAA 52* 11/04/2013 0820   GFRAA 45* 05/17/2014 0932   GFRAA 60 11/04/2013 0820     PENDING LABS:   RADIOGRAPHIC STUDIES:  EXAM: CT CHEST, ABDOMEN, AND PELVIS WITH CONTRAST  IMPRESSION: 1. Interval decrease in size of the pancreatic tail mass. 2. Improved hepatic metastatic disease. No new lesions. 3. No CT findings for metastatic disease involving the chest. 4. Advanced atherosclerotic calcifications involving the thoracic and abdominal aorta and branch vessels. 5. Stable renal cysts and right renal calculus.   Electronically Signed  By: Kalman Jewels M.D.  On: 06/05/2014 10:41     ASSESSMENT and THERAPY PLAN:    Pancreatic cancer metastasized to liver Pleasant 81 year old female with stage IV pancreatic cancer currently on Gemzar and Abraxane. She has had excellent tolerance to therapy next a very good response we reviewed her CT scans today. Unfortunately she had an anaphylactic reaction to the IV contrast. She says she is currently doing well. She had questions regarding duration of therapy and we spent time discussing stage IV pancreatic cancer, its prognosis, treatment options, and general outcomes. She has a good understanding of her disease overall. She currently wishes to continue with chemotherapy. She does state if it makes her too fatigued at some point she may request a break from treatment. She understands that taking a break from therapy that is working may mean growth of her malignancy. She has emphasized today that quality of life is very important to her. We will plan on seeing her back again prior to her next cycle of chemotherapy. I have advised her to call in the interim with any problems or concerns.     All questions were answered. The patient knows to call the clinic with any problems, questions or concerns. We can certainly see the patient much sooner if necessary.   Molli Hazard 06/08/2014

## 2014-06-08 NOTE — Assessment & Plan Note (Signed)
Pleasant 81 year old female with stage IV pancreatic cancer currently on Gemzar and Abraxane. She has had excellent tolerance to therapy next a very good response we reviewed her CT scans today. Unfortunately she had an anaphylactic reaction to the IV contrast. She says she is currently doing well. She had questions regarding duration of therapy and we spent time discussing stage IV pancreatic cancer, its prognosis, treatment options, and general outcomes. She has a good understanding of her disease overall. She currently wishes to continue with chemotherapy. She does state if it makes her too fatigued at some point she may request a break from treatment. She understands that taking a break from therapy that is working may mean growth of her malignancy. She has emphasized today that quality of life is very important to her. We will plan on seeing her back again prior to her next cycle of chemotherapy. I have advised her to call in the interim with any problems or concerns.

## 2014-06-14 ENCOUNTER — Encounter (HOSPITAL_COMMUNITY): Payer: Self-pay | Admitting: Hematology & Oncology

## 2014-06-14 ENCOUNTER — Encounter (HOSPITAL_COMMUNITY): Payer: Medicare Other | Attending: Hematology and Oncology

## 2014-06-14 ENCOUNTER — Other Ambulatory Visit: Payer: Self-pay

## 2014-06-14 ENCOUNTER — Encounter (HOSPITAL_BASED_OUTPATIENT_CLINIC_OR_DEPARTMENT_OTHER): Payer: Medicare Other | Admitting: Hematology & Oncology

## 2014-06-14 DIAGNOSIS — Z5111 Encounter for antineoplastic chemotherapy: Secondary | ICD-10-CM | POA: Diagnosis not present

## 2014-06-14 DIAGNOSIS — C259 Malignant neoplasm of pancreas, unspecified: Secondary | ICD-10-CM

## 2014-06-14 DIAGNOSIS — C801 Malignant (primary) neoplasm, unspecified: Secondary | ICD-10-CM | POA: Insufficient documentation

## 2014-06-14 DIAGNOSIS — C787 Secondary malignant neoplasm of liver and intrahepatic bile duct: Secondary | ICD-10-CM

## 2014-06-14 DIAGNOSIS — C252 Malignant neoplasm of tail of pancreas: Secondary | ICD-10-CM

## 2014-06-14 LAB — COMPREHENSIVE METABOLIC PANEL
ALK PHOS: 91 U/L (ref 39–117)
ALT: 25 U/L (ref 0–35)
AST: 17 U/L (ref 0–37)
Albumin: 3.5 g/dL (ref 3.5–5.2)
Anion gap: 3 — ABNORMAL LOW (ref 5–15)
BUN: 17 mg/dL (ref 6–23)
CALCIUM: 8.4 mg/dL (ref 8.4–10.5)
CHLORIDE: 108 mmol/L (ref 96–112)
CO2: 30 mmol/L (ref 19–32)
Creatinine, Ser: 0.93 mg/dL (ref 0.50–1.10)
GFR calc Af Amer: 64 mL/min — ABNORMAL LOW (ref >90)
GFR calc non Af Amer: 55 mL/min — ABNORMAL LOW (ref >90)
Glucose, Bld: 95 mg/dL (ref 70–99)
POTASSIUM: 3.5 mmol/L (ref 3.5–5.1)
SODIUM: 141 mmol/L (ref 135–145)
TOTAL PROTEIN: 6.8 g/dL (ref 6.0–8.3)
Total Bilirubin: 1 mg/dL (ref 0.3–1.2)

## 2014-06-14 LAB — CBC WITH DIFFERENTIAL/PLATELET
BASOS ABS: 0 10*3/uL (ref 0.0–0.1)
BASOS PCT: 0 % (ref 0–1)
Eosinophils Absolute: 0.2 10*3/uL (ref 0.0–0.7)
Eosinophils Relative: 2 % (ref 0–5)
HCT: 33.4 % — ABNORMAL LOW (ref 36.0–46.0)
Hemoglobin: 11 g/dL — ABNORMAL LOW (ref 12.0–15.0)
LYMPHS ABS: 1.7 10*3/uL (ref 0.7–4.0)
Lymphocytes Relative: 19 % (ref 12–46)
MCH: 28.1 pg (ref 26.0–34.0)
MCHC: 32.9 g/dL (ref 30.0–36.0)
MCV: 85.4 fL (ref 78.0–100.0)
MONO ABS: 1 10*3/uL (ref 0.1–1.0)
Monocytes Relative: 11 % (ref 3–12)
Neutro Abs: 5.7 10*3/uL (ref 1.7–7.7)
Neutrophils Relative %: 68 % (ref 43–77)
PLATELETS: 170 10*3/uL (ref 150–400)
RBC: 3.91 MIL/uL (ref 3.87–5.11)
RDW: 17 % — AB (ref 11.5–15.5)
WBC: 8.6 10*3/uL (ref 4.0–10.5)

## 2014-06-14 MED ORDER — CLONAZEPAM 0.5 MG PO TABS: 0.5000 mg | ORAL_TABLET | Freq: Two times a day (BID) | ORAL | Status: DC | PRN

## 2014-06-14 MED ORDER — HEPARIN SOD (PORK) LOCK FLUSH 100 UNIT/ML IV SOLN
500.0000 [IU] | Freq: Once | INTRAVENOUS | Status: AC | PRN
  Administered 2014-06-14: 500 [IU]
  Filled 2014-06-14: qty 5

## 2014-06-14 MED ORDER — PREDNISONE 5 MG PO TABS: 5.0000 mg | ORAL_TABLET | Freq: Every day | ORAL | Status: DC

## 2014-06-14 MED ORDER — SODIUM CHLORIDE 0.9 % IJ SOLN
10.0000 mL | INTRAMUSCULAR | Status: DC | PRN
  Administered 2014-06-14: 10 mL
  Filled 2014-06-14: qty 10

## 2014-06-14 MED ORDER — SODIUM CHLORIDE 0.9 % IV SOLN
Freq: Once | INTRAVENOUS | Status: AC
Start: 2014-06-14 — End: 2014-06-14
  Administered 2014-06-14: 13:00:00 via INTRAVENOUS

## 2014-06-14 MED ORDER — SODIUM CHLORIDE 0.9 % IV SOLN
Freq: Once | INTRAVENOUS | Status: AC
  Administered 2014-06-14: 8 mg via INTRAVENOUS
  Filled 2014-06-14: qty 4

## 2014-06-14 MED ORDER — SODIUM CHLORIDE 0.9 % IV SOLN
1000.0000 mg/m2 | Freq: Once | INTRAVENOUS | Status: AC
  Administered 2014-06-14: 1786 mg via INTRAVENOUS
  Filled 2014-06-14: qty 46.97

## 2014-06-14 MED ORDER — DEXAMETHASONE SODIUM PHOSPHATE 10 MG/ML IJ SOLN: 10.0000 mg | Freq: Once | INTRAMUSCULAR | Status: DC

## 2014-06-14 MED ORDER — SODIUM CHLORIDE 0.9 % IV SOLN: 8.0000 mg | Freq: Once | INTRAVENOUS | Status: DC

## 2014-06-14 MED ORDER — PACLITAXEL PROTEIN-BOUND CHEMO INJECTION 100 MG
125.0000 mg/m2 | Freq: Once | INTRAVENOUS | Status: AC
  Administered 2014-06-14: 225 mg via INTRAVENOUS
  Filled 2014-06-14: qty 45

## 2014-06-14 NOTE — Progress Notes (Signed)
Tolerated chemo well. 

## 2014-06-14 NOTE — Assessment & Plan Note (Signed)
Pleasant 81 year old female with an excellent performance status and stage IV pancreatic cancer. She is currently on treatment with Gemzar and Abraxane. She has excellent tolerance and last imaging studies actually showed a very good response. She has some fatigue and has requested a few days of 5 mg prednisone. Otherwise she has no other major complaints. We will plan on seeing her back prior to her next cycle of chemotherapy. She is treated on a day 1, day 8 schedule with day 15 off. I have emphasized to her to call us with any problems or concerns. She wished to personally see her CT scans and I reviewed this with her today.

## 2014-06-14 NOTE — Progress Notes (Signed)
[name] Nakayama, MD [address] [place] [address]   DIAGNOSIS: Pancreatic cancer metastasized to liver   Staging form: Pancreas, AJCC 7th Edition     Clinical: Stage IV (T3, N1, M1) - Signed by Baird Cancer, PA-C on 03/16/2014     Pathologic: No stage assigned - Unsigned  CONTRAST MEDIA ALLERGY  SUMMARY OF ONCOLOGIC HISTORY:   Pancreatic cancer metastasized to liver   03/10/2014 Initial Diagnosis Pancreatic cancer metastasized to liver   03/22/2014 -  Chemotherapy Abraxane/Gemzar days 1, 8, every 28 days.  Day 15 was cancelled due to leukopenia and thrombocytopenia on day 15 cycle 1.   05/24/2014 Treatment Plan Change Day 8 of cycle 3 is held with ANC of 1.1    CURRENT THERAPY: Gemzar  INTERVAL HISTORY: [name] 81 y.o. female returns for follow-up of her pancreatic cancer. She is doing well.she is here today with her 2 sisters. They all states she is doing well. She had a brief episode of diarrhea yesterday that resolved on its own. She has occasional fatigue, and prednisone has given her energy in the past. She is requesting a 5 mg tablet of prednisone to take for a couple days only to see if this helps. She feels well enough to do chemotherapy she is having no nausea vomiting and is eating well.   MEDICAL HISTORY: Past Medical History  Diagnosis Date  . Hypertension 20 years   . Chronic diarrhea   . Anxiety   . Depression   . Kidney stone     hx/ crushed   . Hx of adenomatous colonic polyps     tubular adenomas, last found in 2008  . GERD (gastroesophageal reflux disease)   . Erosive esophagitis   . Schatzki's ring 08/26/10    Last dilated on EGD by Dr. Trevor I[facility], linear gastric erosions, BI hemigastrectomy  . Hyperplastic colon polyp 03/19/10    tcs by Dr. Gala Romney  . Pancreatic cancer 02/2014  . Pancreatic cancer metastasized to liver 03/10/2014    has Essential hypertension; OSTEOARTHROSIS, LOCAL, PRIMARY, SHOULDER; DEGENERATIVE  JOINT DISEASE, CERVICAL SPINE; RUPTURE ROTATOR CUFF; ALLERGIC RHINITIS, SEASONAL; GERD (gastroesophageal reflux disease); Anxiety; Primary generalized (osteo)arthritis; Back pain with radiation; Weakness of left leg; Difficulty in walking; Routine general medical examination at a health care facility; IGT (impaired glucose tolerance); Fracture of right ankle; Encounter for annual physical exam; Anemia, iron deficiency; Elevated LFTs; Change in stool habits; Back pain without sciatica; Abnormal liver ultrasound; Pancreatic cancer metastasized to liver; Liver metastases; and Acute bronchitis on her problem list.     is allergic to iohexol; aciphex; amlodipine besylate-valsartan; esomeprazole magnesium; omeprazole; penicillins; and ciprofloxacin.  [name] does not currently have medications on file.  SURGICAL HISTORY: Past Surgical History  Procedure Laterality Date  . Stomach ulcer  50 years ago     had some of her stomach removed   . Bunion removal      from both feet   . Left shoulder surgery  2009    DR HARRISON  . Colonoscopy  03/19/2010    DR Gala Romney,, normal TI, pancolonic diverticula, random colon bx neg., hyperplastic polyps removed  . Esophagogastroduodenoscopy  11/22/2003    DR Gala Romney, erosive RE, Billroth I  . Esophagogastroduodenoscopy  08/26/10    Dr. Gala Romney- moderate severe ERE, Scahtzki ring s/p dilation, Billroth I, linear gastric erosions, bx-gastric xanthelasma  . Breast lumpectomy Left   . Orif ankle fracture Right 05/22/2013    Procedure:  OPEN REDUCTION INTERNAL FIXATION (ORIF) RIGHT ANKLE FRACTURE;  Surgeon: Sanjuana Kava, MD;  Location: [facility];  Service: Orthopedics;  Laterality: Right;  . Cholecystectomy  1965     SOCIAL HISTORY: History   Social History  . Marital Status: Widowed    Spouse Name: N/A    Number of Children: 2  . Years of Education: N/A   Occupational History  . retired from Teacher, adult education    Social History Main Topics  . Smoking status: Former Smoker     Types: Cigarettes    Quit date: 11/09/2011  . Smokeless tobacco: Never Used     Comment: quit a few weeks ago   . Alcohol Use: No  . Drug Use: No  . Sexual Activity: No   Other Topics Concern  . Not on file   Social History Narrative    FAMILY HISTORY: Family History  Problem Relation Age of Onset  . Hypertension Mother   . Kidney failure Brother     X1 ON DIALYSIS  . Diabetes Sister   . Cancer Sister     X2  . Hypertension Father   . Liver disease Neg Hx   . Colon cancer Neg Hx     Review of Systems  Constitutional: Positive for malaise/fatigue. Negative for fever, chills and weight loss.  HENT: Negative for congestion, hearing loss, nosebleeds, sore throat and tinnitus.   Eyes: Negative for blurred vision, double vision, pain and discharge.  Respiratory: Negative for cough, hemoptysis, sputum production, shortness of breath and wheezing.   Cardiovascular: Negative for chest pain, palpitations, claudication, leg swelling and PND.  Gastrointestinal: Negative for heartburn, nausea, vomiting, abdominal pain, diarrhea, constipation, blood in stool and melena.  Genitourinary: Negative for dysuria, urgency, frequency and hematuria.  Musculoskeletal: Negative for myalgias, joint pain and falls.  Skin: Negative for itching and rash.  Neurological: Positive for weakness. Negative for dizziness, tingling, tremors, sensory change, speech change, focal weakness, seizures, loss of consciousness and headaches.  Endo/Heme/Allergies: Does not bruise/bleed easily.  Psychiatric/Behavioral: Negative for depression, suicidal ideas, memory loss and substance abuse. The patient is not nervous/anxious and does not have insomnia.     PHYSICAL EXAMINATION  ECOG PERFORMANCE STATUS: 0 - Asymptomatic  There were no vitals filed for this visit.  Physical Exam  Constitutional: She is oriented to person, place, and time and well-developed, well-nourished, and in no distress.  Wearing hair  prosthesis, well groomed  HENT:  Head: Normocephalic and atraumatic.  Nose: Nose normal.  Mouth/Throat: Oropharynx is clear and moist. No oropharyngeal exudate.  Eyes: Conjunctivae and EOM are normal. Pupils are equal, round, and reactive to light. Right eye exhibits no discharge. Left eye exhibits no discharge. No scleral icterus.  Neck: Normal range of motion. Neck supple. No tracheal deviation present. No thyromegaly present.  Cardiovascular: Normal rate, regular rhythm and normal heart sounds.  Exam reveals no gallop and no friction rub.   No murmur heard. Pulmonary/Chest: Effort normal and breath sounds normal. She has no wheezes. She has no rales.  Abdominal: Soft. Bowel sounds are normal. She exhibits no distension and no mass. There is no tenderness. There is no rebound and no guarding.  Musculoskeletal: Normal range of motion. She exhibits no edema.  Lymphadenopathy:    She has no cervical adenopathy.  Neurological: She is alert and oriented to person, place, and time. She has normal reflexes. No cranial nerve deficit. Gait normal. Coordination normal.  Skin: Skin is warm and dry. No rash noted.  Psychiatric: Mood,  memory, affect and judgment normal.  Nursing note and vitals reviewed.   LABORATORY DATA:  CBC    Component Value Date/Time   WBC 8.6 06/14/2014 1050   RBC 3.91 06/14/2014 1050   HGB 11.0* 06/14/2014 1050   HCT 33.4* 06/14/2014 1050   PLT 170 06/14/2014 1050   MCV 85.4 06/14/2014 1050   MCH 28.1 06/14/2014 1050   MCHC 32.9 06/14/2014 1050   RDW 17.0* 06/14/2014 1050   LYMPHSABS 1.7 06/14/2014 1050   MONOABS 1.0 06/14/2014 1050   EOSABS 0.2 06/14/2014 1050   BASOSABS 0.0 06/14/2014 1050   CMP     Component Value Date/Time   NA 141 06/14/2014 1050   K 3.5 06/14/2014 1050   CL 108 06/14/2014 1050   CO2 30 06/14/2014 1050   GLUCOSE 95 06/14/2014 1050   BUN 17 06/14/2014 1050   CREATININE 0.93 06/14/2014 1050   CREATININE 0.97 02/14/2014 0758   CALCIUM  8.4 06/14/2014 1050   PROT 6.8 06/14/2014 1050   ALBUMIN 3.5 06/14/2014 1050   AST 17 06/14/2014 1050   ALT 25 06/14/2014 1050   ALKPHOS 91 06/14/2014 1050   BILITOT 1.0 06/14/2014 1050   GFRNONAA 55* 06/14/2014 1050   GFRNONAA 52* 11/04/2013 0820   GFRAA 64* 06/14/2014 1050   GFRAA 60 11/04/2013 0820     ASSESSMENT and THERAPY PLAN:    Pancreatic cancer metastasized to liver Pleasant 81 year old female with an excellent performance status and stage IV pancreatic cancer. She is currently on treatment with Gemzar and Abraxane. She has excellent tolerance and last imaging studies actually showed a very good response. She has some fatigue and has requested a few days of 5 mg prednisone. Otherwise she has no other major complaints. We will plan on seeing her back prior to her next cycle of chemotherapy. She is treated on a day 1, day 8 schedule with day 15 off. I have emphasized to her to call us with any problems or concerns. She wished to personally see her CT scans and I reviewed this with her today.     All questions were answered. The patient knows to call the clinic with any problems, questions or concerns. We can certainly see the patient much sooner if necessary.   Molli Hazard 06/14/2014

## 2014-06-14 NOTE — Patient Instructions (Signed)
[facility] Discharge Instructions for Patients Receiving Chemotherapy  Today you received the following chemotherapy agents Abraxane and Gemzar.  To help prevent nausea and vomiting after your treatment, we encourage you to take your nausea medication as instructed.   If you develop nausea and vomiting that is not controlled by your nausea medication, call the clinic. If it is after clinic hours your family physician or the after hours number for the clinic or go to the Emergency Department.  BELOW ARE SYMPTOMS THAT SHOULD BE REPORTED IMMEDIATELY:  *FEVER GREATER THAN 101.0 F  *CHILLS WITH OR WITHOUT FEVER  NAUSEA AND VOMITING THAT IS NOT CONTROLLED WITH YOUR NAUSEA MEDICATION  *UNUSUAL SHORTNESS OF BREATH  *UNUSUAL BRUISING OR BLEEDING  TENDERNESS IN MOUTH AND THROAT WITH OR WITHOUT PRESENCE OF ULCERS  *URINARY PROBLEMS  *BOWEL PROBLEMS  UNUSUAL RASH Items with * indicate a potential emergency and should be followed up as soon as possible.  Return as scheduled.  I have been informed and understand all the instructions given to me. I know to contact the clinic, my physician, or go to the Emergency Department if any problems should occur. I do not have any questions at this time, but understand that I may call the clinic during office hours or the Patient Navigator at [phone] should I have any questions or need assistance in obtaining follow up care.    __________________________________________  _____________  __________ Signature of Patient or Authorized Representative            Date                   Time    __________________________________________ Nurse's Signature

## 2014-06-15 ENCOUNTER — Ambulatory Visit (HOSPITAL_COMMUNITY): Payer: Medicare Other | Admitting: Hematology & Oncology

## 2014-06-15 LAB — CANCER ANTIGEN 19-9: CA 19 9: 87 U/mL — AB (ref <35.0)

## 2014-06-21 ENCOUNTER — Encounter (HOSPITAL_COMMUNITY): Payer: Self-pay

## 2014-06-21 ENCOUNTER — Encounter (HOSPITAL_BASED_OUTPATIENT_CLINIC_OR_DEPARTMENT_OTHER): Payer: Medicare Other

## 2014-06-21 VITALS — BP 165/70 | HR 55 | Temp 97.6°F | Resp 18 | Wt 161.0 lb

## 2014-06-21 DIAGNOSIS — C259 Malignant neoplasm of pancreas, unspecified: Secondary | ICD-10-CM | POA: Diagnosis not present

## 2014-06-21 DIAGNOSIS — C787 Secondary malignant neoplasm of liver and intrahepatic bile duct: Secondary | ICD-10-CM | POA: Diagnosis not present

## 2014-06-21 DIAGNOSIS — C252 Malignant neoplasm of tail of pancreas: Secondary | ICD-10-CM

## 2014-06-21 DIAGNOSIS — N390 Urinary tract infection, site not specified: Secondary | ICD-10-CM

## 2014-06-21 DIAGNOSIS — Z452 Encounter for adjustment and management of vascular access device: Secondary | ICD-10-CM | POA: Diagnosis not present

## 2014-06-21 DIAGNOSIS — C801 Malignant (primary) neoplasm, unspecified: Secondary | ICD-10-CM | POA: Diagnosis not present

## 2014-06-21 LAB — CBC WITH DIFFERENTIAL/PLATELET
BASOS PCT: 1 % (ref 0–1)
Basophils Absolute: 0 10*3/uL (ref 0.0–0.1)
Eosinophils Absolute: 0.1 10*3/uL (ref 0.0–0.7)
Eosinophils Relative: 2 % (ref 0–5)
HCT: 30.3 % — ABNORMAL LOW (ref 36.0–46.0)
Hemoglobin: 9.9 g/dL — ABNORMAL LOW (ref 12.0–15.0)
Lymphocytes Relative: 38 % (ref 12–46)
Lymphs Abs: 1.3 10*3/uL (ref 0.7–4.0)
MCH: 28 pg (ref 26.0–34.0)
MCHC: 32.7 g/dL (ref 30.0–36.0)
MCV: 85.6 fL (ref 78.0–100.0)
MONO ABS: 0.1 10*3/uL (ref 0.1–1.0)
Monocytes Relative: 3 % (ref 3–12)
NEUTROS PCT: 56 % (ref 43–77)
Neutro Abs: 1.9 10*3/uL (ref 1.7–7.7)
PLATELETS: 90 10*3/uL — AB (ref 150–400)
RBC: 3.54 MIL/uL — ABNORMAL LOW (ref 3.87–5.11)
RDW: 16.4 % — AB (ref 11.5–15.5)
WBC: 3.4 10*3/uL — ABNORMAL LOW (ref 4.0–10.5)

## 2014-06-21 LAB — URINALYSIS, ROUTINE W REFLEX MICROSCOPIC
Bilirubin Urine: NEGATIVE
GLUCOSE, UA: NEGATIVE mg/dL
Ketones, ur: NEGATIVE mg/dL
LEUKOCYTES UA: NEGATIVE
Nitrite: NEGATIVE
Protein, ur: NEGATIVE mg/dL
Specific Gravity, Urine: 1.025 (ref 1.005–1.030)
Urobilinogen, UA: 0.2 mg/dL (ref 0.0–1.0)
pH: 5.5 (ref 5.0–8.0)

## 2014-06-21 LAB — URINE MICROSCOPIC-ADD ON

## 2014-06-21 MED ORDER — HEPARIN SOD (PORK) LOCK FLUSH 100 UNIT/ML IV SOLN: 500.0000 [IU] | Freq: Once | INTRAVENOUS | Status: DC | PRN

## 2014-06-21 MED ORDER — SODIUM CHLORIDE 0.9 % IJ SOLN: 10.0000 mL | INTRAMUSCULAR | Status: DC | PRN

## 2014-06-21 MED ORDER — HEPARIN SOD (PORK) LOCK FLUSH 100 UNIT/ML IV SOLN
INTRAVENOUS | Status: AC
Start: 2014-06-21 — End: 2014-06-21
  Filled 2014-06-21: qty 5

## 2014-06-21 MED ORDER — SODIUM CHLORIDE 0.9 % IV SOLN
Freq: Once | INTRAVENOUS | Status: AC
  Administered 2014-06-21: 12:00:00 via INTRAVENOUS

## 2014-06-21 MED ORDER — HEPARIN SOD (PORK) LOCK FLUSH 100 UNIT/ML IV SOLN
500.0000 [IU] | Freq: Once | INTRAVENOUS | Status: AC
  Administered 2014-06-21: 500 [IU] via INTRAVENOUS

## 2014-06-21 NOTE — Progress Notes (Signed)
Patient reported having urinary frequency over the past few weeks, waking her from sleep this week 4-5 times a night.  She denies any fever, chills or painful urination.  No odor or discolorartion to her urine. Symptoms reported to the PA Kefalas.   Patient's treatment was held today due to platelet count.  Urine specimen obtained from the patient, advised will call with results.  Patient verbalized understanding and will call for any questions or concerns.

## 2014-06-21 NOTE — Patient Instructions (Signed)
[place] at [facility] Discharge Instructions  RECOMMENDATIONS MADE BY THE CONSULTANT AND ANY TEST RESULTS WILL BE SENT TO YOUR REFERRING PHYSICIAN. We did not give you treatment as planned today.  Follow up with the clinic next week. We obtained a urine specimen from you today and will call with the results.  If you need an antibiotic we will call if to your pharmacy.   Please call for any questions or concerns.    Thank you for choosing [place] at [facility] to provide your oncology and hematology care.  To afford each patient quality time with our provider, please arrive at least 15 minutes before your scheduled appointment time.    You need to re-schedule your appointment should you arrive 10 or more minutes late.  We strive to give you quality time with our providers, and arriving late affects you and other patients whose appointments are after yours.  Also, if you no show three or more times for appointments you may be dismissed from the clinic at the providers discretion.     Again, thank you for choosing [facility].  Our hope is that these requests will decrease the amount of time that you wait before being seen by our physicians.       _____________________________________________________________  Should you have questions after your visit to [facility], please contact our office at [phone] between the hours of 8:30 a.m. and 4:30 p.m.  Voicemails left after 4:30 p.m. will not be returned until the following business day.  For prescription refill requests, have your pharmacy contact our office.

## 2014-06-26 ENCOUNTER — Other Ambulatory Visit: Payer: Self-pay | Admitting: Family Medicine

## 2014-06-26 DIAGNOSIS — Z1231 Encounter for screening mammogram for malignant neoplasm of breast: Secondary | ICD-10-CM

## 2014-06-27 ENCOUNTER — Encounter (HOSPITAL_COMMUNITY): Payer: Self-pay

## 2014-06-27 ENCOUNTER — Encounter (HOSPITAL_BASED_OUTPATIENT_CLINIC_OR_DEPARTMENT_OTHER): Payer: Medicare Other

## 2014-06-27 DIAGNOSIS — C252 Malignant neoplasm of tail of pancreas: Secondary | ICD-10-CM

## 2014-06-27 DIAGNOSIS — Z5111 Encounter for antineoplastic chemotherapy: Secondary | ICD-10-CM | POA: Diagnosis not present

## 2014-06-27 DIAGNOSIS — C259 Malignant neoplasm of pancreas, unspecified: Secondary | ICD-10-CM | POA: Diagnosis not present

## 2014-06-27 DIAGNOSIS — C787 Secondary malignant neoplasm of liver and intrahepatic bile duct: Secondary | ICD-10-CM

## 2014-06-27 DIAGNOSIS — C801 Malignant (primary) neoplasm, unspecified: Secondary | ICD-10-CM | POA: Diagnosis not present

## 2014-06-27 LAB — COMPREHENSIVE METABOLIC PANEL
ALT: 20 U/L (ref 0–35)
ANION GAP: 3 — AB (ref 5–15)
AST: 15 U/L (ref 0–37)
Albumin: 3.7 g/dL (ref 3.5–5.2)
Alkaline Phosphatase: 89 U/L (ref 39–117)
BUN: 26 mg/dL — ABNORMAL HIGH (ref 6–23)
CALCIUM: 8.6 mg/dL (ref 8.4–10.5)
CO2: 26 mmol/L (ref 19–32)
Chloride: 111 mmol/L (ref 96–112)
Creatinine, Ser: 0.99 mg/dL (ref 0.50–1.10)
GFR calc Af Amer: 59 mL/min — ABNORMAL LOW (ref >90)
GFR, EST NON AFRICAN AMERICAN: 51 mL/min — AB (ref >90)
GLUCOSE: 100 mg/dL — AB (ref 70–99)
Potassium: 3.7 mmol/L (ref 3.5–5.1)
Sodium: 140 mmol/L (ref 135–145)
TOTAL PROTEIN: 6.8 g/dL (ref 6.0–8.3)
Total Bilirubin: 0.7 mg/dL (ref 0.3–1.2)

## 2014-06-27 LAB — CBC WITH DIFFERENTIAL/PLATELET
BASOS ABS: 0 10*3/uL (ref 0.0–0.1)
BASOS PCT: 0 % (ref 0–1)
Eosinophils Absolute: 0.1 10*3/uL (ref 0.0–0.7)
Eosinophils Relative: 3 % (ref 0–5)
HCT: 31 % — ABNORMAL LOW (ref 36.0–46.0)
HEMOGLOBIN: 10.2 g/dL — AB (ref 12.0–15.0)
LYMPHS ABS: 1.3 10*3/uL (ref 0.7–4.0)
LYMPHS PCT: 30 % (ref 12–46)
MCH: 28.3 pg (ref 26.0–34.0)
MCHC: 32.9 g/dL (ref 30.0–36.0)
MCV: 86.1 fL (ref 78.0–100.0)
Monocytes Absolute: 0.3 10*3/uL (ref 0.1–1.0)
Monocytes Relative: 7 % (ref 3–12)
NEUTROS PCT: 60 % (ref 43–77)
Neutro Abs: 2.6 10*3/uL (ref 1.7–7.7)
Platelets: 136 10*3/uL — ABNORMAL LOW (ref 150–400)
RBC: 3.6 MIL/uL — ABNORMAL LOW (ref 3.87–5.11)
RDW: 17.2 % — ABNORMAL HIGH (ref 11.5–15.5)
WBC: 4.4 10*3/uL (ref 4.0–10.5)

## 2014-06-27 MED ORDER — PACLITAXEL PROTEIN-BOUND CHEMO INJECTION 100 MG
125.0000 mg/m2 | Freq: Once | INTRAVENOUS | Status: AC
  Administered 2014-06-27: 225 mg via INTRAVENOUS
  Filled 2014-06-27: qty 45

## 2014-06-27 MED ORDER — HEPARIN SOD (PORK) LOCK FLUSH 100 UNIT/ML IV SOLN
500.0000 [IU] | Freq: Once | INTRAVENOUS | Status: AC | PRN
  Administered 2014-06-27: 500 [IU]

## 2014-06-27 MED ORDER — SODIUM CHLORIDE 0.9 % IJ SOLN
10.0000 mL | INTRAMUSCULAR | Status: DC | PRN
Start: 2014-06-27 — End: 2014-06-27
  Administered 2014-06-27: 10 mL
  Filled 2014-06-27: qty 10

## 2014-06-27 MED ORDER — SODIUM CHLORIDE 0.9 % IV SOLN
Freq: Once | INTRAVENOUS | Status: AC
  Administered 2014-06-27: 8 mg via INTRAVENOUS
  Filled 2014-06-27: qty 4

## 2014-06-27 MED ORDER — SODIUM CHLORIDE 0.9 % IV SOLN: 8.0000 mg | Freq: Once | INTRAVENOUS | Status: DC

## 2014-06-27 MED ORDER — DEXAMETHASONE SODIUM PHOSPHATE 10 MG/ML IJ SOLN: 10.0000 mg | Freq: Once | INTRAMUSCULAR | Status: DC

## 2014-06-27 MED ORDER — SODIUM CHLORIDE 0.9 % IV SOLN
Freq: Once | INTRAVENOUS | Status: AC
  Administered 2014-06-27: 11:00:00 via INTRAVENOUS

## 2014-06-27 MED ORDER — GEMCITABINE HCL CHEMO INJECTION 1 GM/26.3ML
1000.0000 mg/m2 | Freq: Once | INTRAVENOUS | Status: AC
  Administered 2014-06-27: 1786 mg via INTRAVENOUS
  Filled 2014-06-27: qty 46.97

## 2014-06-27 NOTE — Progress Notes (Signed)
1415:  Tolerated tx w/o adverse reaction.  A&Ox4; VSS; discharged ambulatory in c/o sisters for transport home.

## 2014-06-27 NOTE — Patient Instructions (Signed)
[facility] Discharge Instructions for Patients Receiving Chemotherapy  Today you received the following chemotherapy agents:  Abraxane and Gemzar  If you develop nausea and vomiting, or diarrhea that is not controlled by your medication, call the clinic.  The clinic phone number is [phone]. Office hours are Monday-Friday 8:30am-5:00pm.  BELOW ARE SYMPTOMS THAT SHOULD BE REPORTED IMMEDIATELY:  *FEVER GREATER THAN 101.0 F  *CHILLS WITH OR WITHOUT FEVER  NAUSEA AND VOMITING THAT IS NOT CONTROLLED WITH YOUR NAUSEA MEDICATION  *UNUSUAL SHORTNESS OF BREATH  *UNUSUAL BRUISING OR BLEEDING  TENDERNESS IN MOUTH AND THROAT WITH OR WITHOUT PRESENCE OF ULCERS  *URINARY PROBLEMS  *BOWEL PROBLEMS  UNUSUAL RASH Items with * indicate a potential emergency and should be followed up as soon as possible. If you have an emergency after office hours please contact your primary care physician or go to the nearest emergency department.  Please call the clinic during office hours if you have any questions or concerns.   You may also contact the Patient Navigator at [phone] should you have any questions or need assistance in obtaining follow up care. _____________________________________________________________________ Have you asked about our STAR program?    STAR stands for Survivorship Training and Rehabilitation, and this is a nationally recognized cancer care program that focuses on survivorship and rehabilitation.  Cancer and cancer treatments may cause problems, such as, pain, making you feel tired and keeping you from doing the things that you need or want to do. Cancer rehabilitation can help. Our goal is to reduce these troubling effects and help you have the best quality of life possible.  You may receive a survey from a nurse that asks questions about your current state of health.  Based on the survey results, all eligible patients will be referred to the [facility]  program for an evaluation so we can better serve you! A frequently asked questions sheet is available upon request.

## 2014-07-04 ENCOUNTER — Encounter (HOSPITAL_BASED_OUTPATIENT_CLINIC_OR_DEPARTMENT_OTHER): Payer: Medicare Other | Admitting: Hematology & Oncology

## 2014-07-04 ENCOUNTER — Encounter (HOSPITAL_COMMUNITY): Payer: Self-pay | Admitting: Hematology & Oncology

## 2014-07-04 VITALS — BP 168/78 | HR 64 | Temp 97.9°F | Resp 18 | Wt 159.0 lb

## 2014-07-04 DIAGNOSIS — C787 Secondary malignant neoplasm of liver and intrahepatic bile duct: Secondary | ICD-10-CM | POA: Diagnosis not present

## 2014-07-04 DIAGNOSIS — C252 Malignant neoplasm of tail of pancreas: Secondary | ICD-10-CM

## 2014-07-04 DIAGNOSIS — C259 Malignant neoplasm of pancreas, unspecified: Secondary | ICD-10-CM

## 2014-07-04 NOTE — Patient Instructions (Signed)
[place] at [facility] Discharge Instructions  RECOMMENDATIONS MADE BY THE CONSULTANT AND ANY TEST RESULTS WILL BE SENT TO YOUR REFERRING PHYSICIAN.  Return as scheduled for chemotherapy treatments and office visit.   Thank you for choosing [place] at [facility] to provide your oncology and hematology care.  To afford each patient quality time with our provider, please arrive at least 15 minutes before your scheduled appointment time.    You need to re-schedule your appointment should you arrive 10 or more minutes late.  We strive to give you quality time with our providers, and arriving late affects you and other patients whose appointments are after yours.  Also, if you no show three or more times for appointments you may be dismissed from the clinic at the providers discretion.     Again, thank you for choosing [facility]  Our hope is that these requests will decrease the amount of time that you wait before being seen by our physicians.       _____________________________________________________________  Should you have questions after your visit to [facility], please contact our office at [phone] between the hours of 8:30 a.m. and 4:30 p.m.  Voicemails left after 4:30 p.m. will not be returned until the following business day.  For prescription refill requests, have your pharmacy contact our office.

## 2014-07-12 ENCOUNTER — Ambulatory Visit (HOSPITAL_COMMUNITY): Payer: Medicare Other | Admitting: Oncology

## 2014-07-12 ENCOUNTER — Encounter (HOSPITAL_COMMUNITY): Payer: Self-pay

## 2014-07-12 ENCOUNTER — Encounter (HOSPITAL_COMMUNITY): Payer: Medicare Other | Attending: Hematology and Oncology

## 2014-07-12 ENCOUNTER — Inpatient Hospital Stay (HOSPITAL_COMMUNITY): Payer: Medicare Other

## 2014-07-12 DIAGNOSIS — C252 Malignant neoplasm of tail of pancreas: Secondary | ICD-10-CM | POA: Diagnosis not present

## 2014-07-12 DIAGNOSIS — C801 Malignant (primary) neoplasm, unspecified: Secondary | ICD-10-CM | POA: Insufficient documentation

## 2014-07-12 DIAGNOSIS — Z5111 Encounter for antineoplastic chemotherapy: Secondary | ICD-10-CM | POA: Diagnosis not present

## 2014-07-12 DIAGNOSIS — C259 Malignant neoplasm of pancreas, unspecified: Secondary | ICD-10-CM | POA: Insufficient documentation

## 2014-07-12 DIAGNOSIS — C787 Secondary malignant neoplasm of liver and intrahepatic bile duct: Secondary | ICD-10-CM

## 2014-07-12 LAB — CBC WITH DIFFERENTIAL/PLATELET
BASOS ABS: 0 10*3/uL (ref 0.0–0.1)
BASOS PCT: 0 % (ref 0–1)
EOS PCT: 0 % (ref 0–5)
Eosinophils Absolute: 0 10*3/uL (ref 0.0–0.7)
HEMATOCRIT: 33.1 % — AB (ref 36.0–46.0)
Hemoglobin: 10.6 g/dL — ABNORMAL LOW (ref 12.0–15.0)
Lymphocytes Relative: 12 % (ref 12–46)
Lymphs Abs: 0.7 10*3/uL (ref 0.7–4.0)
MCH: 27.7 pg (ref 26.0–34.0)
MCHC: 32 g/dL (ref 30.0–36.0)
MCV: 86.6 fL (ref 78.0–100.0)
Monocytes Absolute: 0.2 10*3/uL (ref 0.1–1.0)
Monocytes Relative: 4 % (ref 3–12)
Neutro Abs: 4.5 10*3/uL (ref 1.7–7.7)
Neutrophils Relative %: 84 % — ABNORMAL HIGH (ref 43–77)
Platelets: 173 10*3/uL (ref 150–400)
RBC: 3.82 MIL/uL — ABNORMAL LOW (ref 3.87–5.11)
RDW: 17.1 % — AB (ref 11.5–15.5)
WBC: 5.4 10*3/uL (ref 4.0–10.5)

## 2014-07-12 LAB — COMPREHENSIVE METABOLIC PANEL
ALT: 16 U/L (ref 0–35)
AST: 18 U/L (ref 0–37)
Albumin: 3.9 g/dL (ref 3.5–5.2)
Alkaline Phosphatase: 98 U/L (ref 39–117)
Anion gap: 8 (ref 5–15)
BILIRUBIN TOTAL: 0.5 mg/dL (ref 0.3–1.2)
BUN: 18 mg/dL (ref 6–23)
CALCIUM: 9 mg/dL (ref 8.4–10.5)
CO2: 28 mmol/L (ref 19–32)
CREATININE: 1.03 mg/dL (ref 0.50–1.10)
Chloride: 107 mmol/L (ref 96–112)
GFR calc Af Amer: 57 mL/min — ABNORMAL LOW (ref >90)
GFR calc non Af Amer: 49 mL/min — ABNORMAL LOW (ref >90)
Glucose, Bld: 128 mg/dL — ABNORMAL HIGH (ref 70–99)
Potassium: 4.2 mmol/L (ref 3.5–5.1)
Sodium: 143 mmol/L (ref 135–145)
Total Protein: 7 g/dL (ref 6.0–8.3)

## 2014-07-12 MED ORDER — SODIUM CHLORIDE 0.9 % IV SOLN
1000.0000 mg/m2 | Freq: Once | INTRAVENOUS | Status: AC
  Administered 2014-07-12: 1786 mg via INTRAVENOUS
  Filled 2014-07-12: qty 46.97

## 2014-07-12 MED ORDER — SODIUM CHLORIDE 0.9 % IV SOLN
Freq: Once | INTRAVENOUS | Status: AC
  Administered 2014-07-12: 8 mg via INTRAVENOUS
  Filled 2014-07-12: qty 4

## 2014-07-12 MED ORDER — DEXAMETHASONE SODIUM PHOSPHATE 10 MG/ML IJ SOLN: 10.0000 mg | Freq: Once | INTRAMUSCULAR | Status: DC

## 2014-07-12 MED ORDER — SODIUM CHLORIDE 0.9 % IV SOLN
Freq: Once | INTRAVENOUS | Status: AC
  Administered 2014-07-12: 11:00:00 via INTRAVENOUS

## 2014-07-12 MED ORDER — ONDANSETRON HCL 40 MG/20ML IJ SOLN: 8.0000 mg | Freq: Once | INTRAMUSCULAR | Status: DC

## 2014-07-12 MED ORDER — SODIUM CHLORIDE 0.9 % IJ SOLN
10.0000 mL | INTRAMUSCULAR | Status: DC | PRN
  Administered 2014-07-12: 10 mL
  Filled 2014-07-12: qty 10

## 2014-07-12 MED ORDER — PACLITAXEL PROTEIN-BOUND CHEMO INJECTION 100 MG
125.0000 mg/m2 | Freq: Once | INTRAVENOUS | Status: AC
  Administered 2014-07-12: 225 mg via INTRAVENOUS
  Filled 2014-07-12: qty 45

## 2014-07-12 MED ORDER — HEPARIN SOD (PORK) LOCK FLUSH 100 UNIT/ML IV SOLN
500.0000 [IU] | Freq: Once | INTRAVENOUS | Status: AC | PRN
Start: 2014-07-12 — End: 2014-07-12
  Administered 2014-07-12: 500 [IU]
  Filled 2014-07-12: qty 5

## 2014-07-12 NOTE — Progress Notes (Signed)
1500:  Tolerated tx w/o adverse reaction; a&ox4; in no distress.  Left in c/o sister for transport home.

## 2014-07-12 NOTE — Patient Instructions (Signed)
[facility] Discharge Instructions for Patients Receiving Chemotherapy  Today you received the following chemotherapy agents:  Abraxane and Gemzar.  If you develop nausea and vomiting, or diarrhea that is not controlled by your medication, call the clinic.  The clinic phone number is [phone]. Office hours are Monday-Friday 8:30am-5:00pm.  BELOW ARE SYMPTOMS THAT SHOULD BE REPORTED IMMEDIATELY:  *FEVER GREATER THAN 101.0 F  *CHILLS WITH OR WITHOUT FEVER  NAUSEA AND VOMITING THAT IS NOT CONTROLLED WITH YOUR NAUSEA MEDICATION  *UNUSUAL SHORTNESS OF BREATH  *UNUSUAL BRUISING OR BLEEDING  TENDERNESS IN MOUTH AND THROAT WITH OR WITHOUT PRESENCE OF ULCERS  *URINARY PROBLEMS  *BOWEL PROBLEMS  UNUSUAL RASH Items with * indicate a potential emergency and should be followed up as soon as possible. If you have an emergency after office hours please contact your primary care physician or go to the nearest emergency department.  Please call the clinic during office hours if you have any questions or concerns.   You may also contact the Patient Navigator at [phone] should you have any questions or need assistance in obtaining follow up care. _____________________________________________________________________ Have you asked about our STAR program?    STAR stands for Survivorship Training and Rehabilitation, and this is a nationally recognized cancer care program that focuses on survivorship and rehabilitation.  Cancer and cancer treatments may cause problems, such as, pain, making you feel tired and keeping you from doing the things that you need or want to do. Cancer rehabilitation can help. Our goal is to reduce these troubling effects and help you have the best quality of life possible.  You may receive a survey from a nurse that asks questions about your current state of health.  Based on the survey results, all eligible patients will be referred to the [facility]  program for an evaluation so we can better serve you! A frequently asked questions sheet is available upon request.

## 2014-07-13 LAB — CANCER ANTIGEN 19-9: CA 19 9: 41 U/mL — AB (ref 0–35)

## 2014-07-19 ENCOUNTER — Encounter (HOSPITAL_BASED_OUTPATIENT_CLINIC_OR_DEPARTMENT_OTHER): Payer: Medicare Other

## 2014-07-19 ENCOUNTER — Encounter (HOSPITAL_BASED_OUTPATIENT_CLINIC_OR_DEPARTMENT_OTHER): Payer: Medicare Other | Admitting: Oncology

## 2014-07-19 ENCOUNTER — Encounter (HOSPITAL_COMMUNITY): Payer: Self-pay

## 2014-07-19 DIAGNOSIS — C259 Malignant neoplasm of pancreas, unspecified: Secondary | ICD-10-CM

## 2014-07-19 DIAGNOSIS — C252 Malignant neoplasm of tail of pancreas: Secondary | ICD-10-CM | POA: Diagnosis not present

## 2014-07-19 DIAGNOSIS — C787 Secondary malignant neoplasm of liver and intrahepatic bile duct: Secondary | ICD-10-CM

## 2014-07-19 DIAGNOSIS — D509 Iron deficiency anemia, unspecified: Secondary | ICD-10-CM

## 2014-07-19 DIAGNOSIS — C801 Malignant (primary) neoplasm, unspecified: Secondary | ICD-10-CM | POA: Diagnosis not present

## 2014-07-19 DIAGNOSIS — Z5111 Encounter for antineoplastic chemotherapy: Secondary | ICD-10-CM | POA: Diagnosis not present

## 2014-07-19 DIAGNOSIS — R530 Neoplastic (malignant) related fatigue: Secondary | ICD-10-CM | POA: Diagnosis not present

## 2014-07-19 LAB — CBC WITH DIFFERENTIAL/PLATELET
Basophils Absolute: 0 10*3/uL (ref 0.0–0.1)
Basophils Relative: 0 % (ref 0–1)
Eosinophils Absolute: 0 10*3/uL (ref 0.0–0.7)
Eosinophils Relative: 0 % (ref 0–5)
HCT: 29.8 % — ABNORMAL LOW (ref 36.0–46.0)
Hemoglobin: 9.8 g/dL — ABNORMAL LOW (ref 12.0–15.0)
Lymphocytes Relative: 35 % (ref 12–46)
Lymphs Abs: 1.3 10*3/uL (ref 0.7–4.0)
MCH: 28.1 pg (ref 26.0–34.0)
MCHC: 32.9 g/dL (ref 30.0–36.0)
MCV: 85.4 fL (ref 78.0–100.0)
MONO ABS: 0.1 10*3/uL (ref 0.1–1.0)
Monocytes Relative: 3 % (ref 3–12)
NEUTROS ABS: 2.3 10*3/uL (ref 1.7–7.7)
Neutrophils Relative %: 62 % (ref 43–77)
PLATELETS: 184 10*3/uL (ref 150–400)
RBC: 3.49 MIL/uL — AB (ref 3.87–5.11)
RDW: 16.6 % — ABNORMAL HIGH (ref 11.5–15.5)
WBC: 3.7 10*3/uL — AB (ref 4.0–10.5)

## 2014-07-19 LAB — COMPREHENSIVE METABOLIC PANEL
ALT: 16 U/L (ref 0–35)
AST: 16 U/L (ref 0–37)
Albumin: 3.7 g/dL (ref 3.5–5.2)
Alkaline Phosphatase: 88 U/L (ref 39–117)
Anion gap: 5 (ref 5–15)
BUN: 16 mg/dL (ref 6–23)
CO2: 30 mmol/L (ref 19–32)
Calcium: 8.8 mg/dL (ref 8.4–10.5)
Chloride: 105 mmol/L (ref 96–112)
Creatinine, Ser: 0.98 mg/dL (ref 0.50–1.10)
GFR calc Af Amer: 60 mL/min — ABNORMAL LOW (ref >90)
GFR calc non Af Amer: 52 mL/min — ABNORMAL LOW (ref >90)
GLUCOSE: 105 mg/dL — AB (ref 70–99)
POTASSIUM: 3.9 mmol/L (ref 3.5–5.1)
SODIUM: 140 mmol/L (ref 135–145)
Total Bilirubin: 0.7 mg/dL (ref 0.3–1.2)
Total Protein: 6.8 g/dL (ref 6.0–8.3)

## 2014-07-19 LAB — FERRITIN: Ferritin: 168 ng/mL (ref 10–291)

## 2014-07-19 MED ORDER — SODIUM CHLORIDE 0.9 % IV SOLN
Freq: Once | INTRAVENOUS | Status: AC
  Administered 2014-07-19: 12:00:00 via INTRAVENOUS

## 2014-07-19 MED ORDER — PACLITAXEL PROTEIN-BOUND CHEMO INJECTION 100 MG
125.0000 mg/m2 | Freq: Once | INTRAVENOUS | Status: AC
  Administered 2014-07-19: 225 mg via INTRAVENOUS
  Filled 2014-07-19: qty 45

## 2014-07-19 MED ORDER — SODIUM CHLORIDE 0.9 % IV SOLN
1000.0000 mg/m2 | Freq: Once | INTRAVENOUS | Status: AC
  Administered 2014-07-19: 1786 mg via INTRAVENOUS
  Filled 2014-07-19: qty 46.97

## 2014-07-19 MED ORDER — ONDANSETRON HCL 40 MG/20ML IJ SOLN: 8.0000 mg | Freq: Once | INTRAMUSCULAR | Status: DC

## 2014-07-19 MED ORDER — PREDNISONE 5 MG PO TABS: 5.0000 mg | ORAL_TABLET | Freq: Every day | ORAL | Status: DC

## 2014-07-19 MED ORDER — SODIUM CHLORIDE 0.9 % IJ SOLN
10.0000 mL | INTRAMUSCULAR | Status: DC | PRN
  Administered 2014-07-19: 10 mL
  Filled 2014-07-19: qty 10

## 2014-07-19 MED ORDER — DEXAMETHASONE SODIUM PHOSPHATE 10 MG/ML IJ SOLN: 10.0000 mg | Freq: Once | INTRAMUSCULAR | Status: DC

## 2014-07-19 MED ORDER — HEPARIN SOD (PORK) LOCK FLUSH 100 UNIT/ML IV SOLN
500.0000 [IU] | Freq: Once | INTRAVENOUS | Status: AC | PRN
  Administered 2014-07-19: 500 [IU]
  Filled 2014-07-19: qty 5

## 2014-07-19 MED ORDER — SODIUM CHLORIDE 0.9 % IV SOLN
Freq: Once | INTRAVENOUS | Status: AC
  Administered 2014-07-19: 8 mg via INTRAVENOUS
  Filled 2014-07-19: qty 4

## 2014-07-19 NOTE — Progress Notes (Signed)
[name] Nakayama, MD [address] [place] [address]  Pancreatic cancer metastasized to liver - Plan: CBC with Differential, Comprehensive metabolic panel, Cancer antigen 19-9  Anemia, iron deficiency - Plan: CBC with Differential, Ferritin  Neoplastic malignant related fatigue - Plan: predniSONE (DELTASONE) 5 MG tablet  CURRENT THERAPY: Gemzar/Abraxane, presenting for Day 8 of cycle #5 today.  INTERVAL HISTORY: [name] 81 y.o. female returns for followup of Stage IV Pancreatic Cancer, on systemic therapy with Abraxane and Gemzar in a day 1, 8 fashion every 28 days.    Pancreatic cancer metastasized to liver   03/10/2014 Initial Diagnosis Pancreatic cancer metastasized to liver   03/22/2014 -  Chemotherapy Abraxane/Gemzar days 1, 8, every 28 days.  Day 15 was cancelled due to leukopenia and thrombocytopenia on day 15 cycle 1.   05/24/2014 Treatment Plan Change Day 8 of cycle 3 is held with ANC of 1.1   I personally reviewed and went over laboratory results with the patient.  The results are noted within this dictation.  Patient requested I review her CT imaging with her and compare her new images to her old images.  I used a Teaching laboratory technician and logged into PACS.  I reviewed the images with her in detail.  All malignancy sites are improved with therapy.  On the report from her CT imaging in Jan 2016, the third sentence in the hepatobiliary section reports a 87 mm hepatic lesion in segment 8 compared to 13 mm on previous MRI.  This is a typo and I discussed the case with Dr. Thornton Papas.  He will reach out to Dr. Candise Che to addend his report.  She was given Prednisone by Dr. Whitney Muse to help with fatigue.  She tried her on a trial course.  [name] reports that it significantly improved her QOL and energy.  Therefore, I have refilled her Prednisone and she is encouraged to take 1 tablet with food in the AM.  Oncologically, she denies any complaints and ROS questioning  is negative.   Past Medical History  Diagnosis Date  . Hypertension 20 years   . Chronic diarrhea   . Anxiety   . Depression   . Kidney stone     hx/ crushed   . Hx of adenomatous colonic polyps     tubular adenomas, last found in 2008  . GERD (gastroesophageal reflux disease)   . Erosive esophagitis   . Schatzki's ring 08/26/10    Last dilated on EGD by Dr. Trevor Iha [facility], linear gastric erosions, BI hemigastrectomy  . Hyperplastic colon polyp 03/19/10    tcs by Dr. Gala Romney  . Pancreatic cancer 02/2014  . Pancreatic cancer metastasized to liver 03/10/2014    has Essential hypertension; OSTEOARTHROSIS, LOCAL, PRIMARY, SHOULDER; DEGENERATIVE JOINT DISEASE, CERVICAL SPINE; RUPTURE ROTATOR CUFF; ALLERGIC RHINITIS, SEASONAL; GERD (gastroesophageal reflux disease); Anxiety; Primary generalized (osteo)arthritis; Back pain with radiation; Weakness of left leg; Difficulty in walking; Routine general medical examination at a health care facility; IGT (impaired glucose tolerance); Fracture of right ankle; Encounter for annual physical exam; Anemia, iron deficiency; Elevated LFTs; Change in stool habits; Back pain without sciatica; Abnormal liver ultrasound; Pancreatic cancer metastasized to liver; Liver metastases; and Acute bronchitis on her problem list.     is allergic to iohexol; aciphex; amlodipine besylate-valsartan; esomeprazole magnesium; omeprazole; penicillins; and ciprofloxacin.  [name] had no medications administered during this visit.  Past Surgical History  Procedure Laterality Date  . Stomach ulcer  50 years  ago     had some of her stomach removed   . Bunion removal      from both feet   . Left shoulder surgery  2009    DR HARRISON  . Colonoscopy  03/19/2010    DR Gala Romney,, normal TI, pancolonic diverticula, random colon bx neg., hyperplastic polyps removed  . Esophagogastroduodenoscopy  11/22/2003    DR Gala Romney, erosive RE, Billroth I  . Esophagogastroduodenoscopy  08/26/10      Dr. Gala Romney- moderate severe ERE, Scahtzki ring s/p dilation, Billroth I, linear gastric erosions, bx-gastric xanthelasma  . Breast lumpectomy Left   . Orif ankle fracture Right 05/22/2013    Procedure: OPEN REDUCTION INTERNAL FIXATION (ORIF) RIGHT ANKLE FRACTURE;  Surgeon: Sanjuana Kava, MD;  Location: [facility];  Service: Orthopedics;  Laterality: Right;  . Cholecystectomy  1965     Denies any headaches, dizziness, double vision, fevers, chills, night sweats, nausea, vomiting, diarrhea, constipation, chest pain, heart palpitations, shortness of breath, blood in stool, black tarry stool, urinary pain, urinary burning, urinary frequency, hematuria.   PHYSICAL EXAMINATION  ECOG PERFORMANCE STATUS: 0 - Asymptomatic  There were no vitals filed for this visit.  GENERAL:alert, no distress, well nourished, well developed, obese and smiling SKIN: skin color, texture, turgor are normal, no rashes or significant lesions HEAD: Normocephalic, No masses, lesions, tenderness or abnormalities EYES: normal, PERRLA, EOMI, Conjunctiva are pink and non-injected EARS: External ears normal OROPHARYNX:lips, buccal mucosa, and tongue normal and mucous membranes are moist  NECK: supple, no adenopathy, thyroid normal size, non-tender, without nodularity, no stridor, non-tender, trachea midline LYMPH:  no palpable lymphadenopathy, no hepatosplenomegaly BREAST:not examined LUNGS: clear to auscultation and percussion HEART: regular rate & rhythm, no murmurs and no gallops ABDOMEN:abdomen soft, non-tender, normal bowel sounds and no masses or organomegaly BACK: Back symmetric, no curvature. EXTREMITIES:less then 2 second capillary refill, no joint deformities, effusion, or inflammation, no edema, no skin discoloration, no clubbing, no cyanosis  NEURO: alert & oriented x 3 with fluent speech, no focal motor/sensory deficits, gait normal   LABORATORY DATA: CBC    Component Value Date/Time   WBC 3.7* 07/19/2014  1100   RBC 3.49* 07/19/2014 1100   HGB 9.8* 07/19/2014 1100   HCT 29.8* 07/19/2014 1100   PLT 184 07/19/2014 1100   MCV 85.4 07/19/2014 1100   MCH 28.1 07/19/2014 1100   MCHC 32.9 07/19/2014 1100   RDW 16.6* 07/19/2014 1100   LYMPHSABS 1.3 07/19/2014 1100   MONOABS 0.1 07/19/2014 1100   EOSABS 0.0 07/19/2014 1100   BASOSABS 0.0 07/19/2014 1100      Chemistry      Component Value Date/Time   NA 140 07/19/2014 1100   K 3.9 07/19/2014 1100   CL 105 07/19/2014 1100   CO2 30 07/19/2014 1100   BUN 16 07/19/2014 1100   CREATININE 0.98 07/19/2014 1100   CREATININE 0.97 02/14/2014 0758      Component Value Date/Time   CALCIUM 8.8 07/19/2014 1100   ALKPHOS 88 07/19/2014 1100   AST 16 07/19/2014 1100   ALT 16 07/19/2014 1100   BILITOT 0.7 07/19/2014 1100     Results for LAURENCIA, ROMA (MRN [id number]) as of 07/19/2014 07:42  Ref. Range 07/12/2014 11:20  CA 19-9 Latest Range: 0-35 U/mL 41 (H)     ASSESSMENT AND PLAN:  Pancreatic cancer metastasized to liver Stage IV Pancreatic Cancer on systemic therapy with Abraxane and Gemzar in a day 1 and 8 every 28 day fashion.  She is tolerating  therapy well.  She is experiencing an excellent response to therapy clinically, radiographically, and biochemically.  Her CA19-9 is down to 41 most recently. She notes improvement in energy level with physiologic dose of Prednisone trial by Dr. Whitney Muse.  Therefore, refill on Prednisone was provided.   Pre-chemo labs today as ordered.  Day 8 of cycle #5 today as planned.  Return in 2 weeks for follow-up at which time we will start cycle #6.   Anemia, iron deficiency Will order ferritin today to evaluate status.   THERAPY PLAN:  Will continue with therapy as planned.   All questions were answered. The patient knows to call the clinic with any problems, questions or concerns. We can certainly see the patient much sooner if necessary.  Patient and plan discussed with Dr. Ancil Linsey and she is in  agreement with the aforementioned.   This note is electronically signed by: Robynn Pane 07/19/2014 11:34 AM

## 2014-07-19 NOTE — Assessment & Plan Note (Signed)
Will order ferritin today to evaluate status.

## 2014-07-19 NOTE — Progress Notes (Signed)
1515 Tolerated chemo well.

## 2014-07-19 NOTE — Patient Instructions (Signed)
[place] at [facility] Discharge Instructions  RECOMMENDATIONS MADE BY THE CONSULTANT AND ANY TEST RESULTS WILL BE SENT TO YOUR REFERRING PHYSICIAN.  Come back in two weeks or your appointment date for labs and chemo.  Thank you for choosing [place] at [facility] to provide your oncology and hematology care.  To afford each patient quality time with our provider, please arrive at least 15 minutes before your scheduled appointment time.    You need to re-schedule your appointment should you arrive 10 or more minutes late.  We strive to give you quality time with our providers, and arriving late affects you and other patients whose appointments are after yours.  Also, if you no show three or more times for appointments you may be dismissed from the clinic at the providers discretion.     Again, thank you for choosing [facility].  Our hope is that these requests will decrease the amount of time that you wait before being seen by our physicians.       _____________________________________________________________  Should you have questions after your visit to [facility], please contact our office at [phone] between the hours of 8:30 a.m. and 4:30 p.m.  Voicemails left after 4:30 p.m. will not be returned until the following business day.  For prescription refill requests, have your pharmacy contact our office.

## 2014-07-19 NOTE — Assessment & Plan Note (Addendum)
Stage IV Pancreatic Cancer on systemic therapy with Abraxane and Gemzar in a day 1 and 8 every 28 day fashion.  She is tolerating therapy well.  She is experiencing an excellent response to therapy clinically, radiographically, and biochemically.  Her CA19-9 is down to 41 most recently. She notes improvement in energy level with physiologic dose of Prednisone trial by Dr. Whitney Muse.  Therefore, refill on Prednisone was provided.   Pre-chemo labs today as ordered.  Day 8 of cycle #5 today as planned.  Return in 3 weeks for follow-up at which time we will start cycle #6.

## 2014-07-23 ENCOUNTER — Other Ambulatory Visit: Payer: Self-pay | Admitting: Family Medicine

## 2014-07-24 ENCOUNTER — Telehealth (HOSPITAL_COMMUNITY): Payer: Self-pay | Admitting: *Deleted

## 2014-07-24 NOTE — Telephone Encounter (Signed)
"  She called and stated that she has a cold and is coughing, but not able to get anything up. She wants to know what she can take? " Received above message from scheduler. Called patient to f/u. She is using a nasal spray and delsym cough syrup q 24 hrs. I advised her that she can use robitussin or something with guaifenesin to try to loosen cough and that she may try claritin or zyrtec as well. She denies fever. She is not due back to see [place] until the 30th and I advised her to call if she worsens.

## 2014-07-25 NOTE — Assessment & Plan Note (Signed)
81 year old female with stage IV pancreatic cancer. She is currently on Gemzar and Abraxane and does reasonably well. She is active and gets out daily. She gets dressed daily. Last imaging studies showed improved disease and clinically she is doing very well. I recommended ongoing therapy and she is agreeable. We will proceed with her next cycle of treatment and plan on seeing her back in 2 weeks with repeat laboratory studies.

## 2014-07-25 NOTE — Progress Notes (Signed)
[name] Nakayama, MD [address] [place] [address]   DIAGNOSIS: Pancreatic cancer metastasized to liver   Staging form: Pancreas, AJCC 7th Edition     Clinical: Stage IV (T3, N1, M1) - Signed by Baird Cancer, PA-C on 03/16/2014     Pathologic: No stage assigned - Unsigned  CONTRAST MEDIA ALLERGY  SUMMARY OF ONCOLOGIC HISTORY:   Pancreatic cancer metastasized to liver   03/10/2014 Initial Diagnosis Pancreatic cancer metastasized to liver   03/22/2014 -  Chemotherapy Abraxane/Gemzar days 1, 8, every 28 days.  Day 15 was cancelled due to leukopenia and thrombocytopenia on day 15 cycle 1.   05/24/2014 Treatment Plan Change Day 8 of cycle 3 is held with ANC of 1.1   06/05/2014 Imaging CT C/A/P . Interval decrease in size of the pancreatic tail mass.Improved hepatic metastatic disease. No new lesions. No CT findings for metastatic disease involving the chest.    CURRENT THERAPY: Gemzar/Abraxane  INTERVAL HISTORY: [name] 81 y.o. female returns for follow-up of her pancreatic cancer. She took a very short course of prednisone and states it helped with her fatigue. She likes to have a few prednisone at home because she will take them "every now and then." She was having trouble with a runny nose and we advised her to try Nasacort over-the-counter. She states her symptoms are markedly improved. She has no other major complaints today. Her last imaging studies were on January 25. She is here today for her next cycle of chemotherapy.   MEDICAL HISTORY: Past Medical History  Diagnosis Date  . Hypertension 20 years   . Chronic diarrhea   . Anxiety   . Depression   . Kidney stone     hx/ crushed   . Hx of adenomatous colonic polyps     tubular adenomas, last found in 2008  . GERD (gastroesophageal reflux disease)   . Erosive esophagitis   . Schatzki's ring 08/26/10    Last dilated on EGD by Dr. Trevor [facility], linear gastric erosions, BI hemigastrectomy    . Hyperplastic colon polyp 03/19/10    tcs by Dr. Gala Romney  . Pancreatic cancer 02/2014  . Pancreatic cancer metastasized to liver 03/10/2014    has Essential hypertension; OSTEOARTHROSIS, LOCAL, PRIMARY, SHOULDER; DEGENERATIVE JOINT DISEASE, CERVICAL SPINE; RUPTURE ROTATOR CUFF; ALLERGIC RHINITIS, SEASONAL; GERD (gastroesophageal reflux disease); Anxiety; Primary generalized (osteo)arthritis; Back pain with radiation; Weakness of left leg; Difficulty in walking; Routine general medical examination at a health care facility; IGT (impaired glucose tolerance); Fracture of right ankle; Encounter for annual physical exam; Anemia, iron deficiency; Elevated LFTs; Change in stool habits; Back pain without sciatica; Abnormal liver ultrasound; Pancreatic cancer metastasized to liver; Liver metastases; and Acute bronchitis on her problem list.     is allergic to iohexol; aciphex; amlodipine besylate-valsartan; esomeprazole magnesium; omeprazole; penicillins; and ciprofloxacin.  [name] does not currently have medications on file.  SURGICAL HISTORY: Past Surgical History  Procedure Laterality Date  . Stomach ulcer  50 years ago     had some of her stomach removed   . Bunion removal      from both feet   . Left shoulder surgery  2009    DR HARRISON  . Colonoscopy  03/19/2010    DR Gala Romney,, normal TI, pancolonic diverticula, random colon bx neg., hyperplastic polyps removed  . Esophagogastroduodenoscopy  11/22/2003    DR Gala Romney, erosive RE, Billroth I  . Esophagogastroduodenoscopy  08/26/10    Dr. Gala Romney-  moderate severe ERE, Scahtzki ring s/p dilation, Billroth I, linear gastric erosions, bx-gastric xanthelasma  . Breast lumpectomy Left   . Orif ankle fracture Right 05/22/2013    Procedure: OPEN REDUCTION INTERNAL FIXATION (ORIF) RIGHT ANKLE FRACTURE;  Surgeon: Sanjuana Kava, MD;  Location: [facility];  Service: Orthopedics;  Laterality: Right;  . Cholecystectomy  1965     SOCIAL HISTORY: History    Social History  . Marital Status: Widowed    Spouse Name: N/A  . Number of Children: 2  . Years of Education: N/A   Occupational History  . retired from Teacher, adult education    Social History Main Topics  . Smoking status: Former Smoker    Types: Cigarettes    Quit date: 11/09/2011  . Smokeless tobacco: Never Used     Comment: quit a few weeks ago   . Alcohol Use: No  . Drug Use: No  . Sexual Activity: No   Other Topics Concern  . Not on file   Social History Narrative    FAMILY HISTORY: Family History  Problem Relation Age of Onset  . Hypertension Mother   . Kidney failure Brother     X1 ON DIALYSIS  . Diabetes Sister   . Cancer Sister     X2  . Hypertension Father   . Liver disease Neg Hx   . Colon cancer Neg Hx     Review of Systems  Constitutional: Positive for malaise/fatigue. Negative for fever, chills and weight loss.  HENT: Negative for congestion, hearing loss, nosebleeds, sore throat and tinnitus.   Eyes: Negative for blurred vision, double vision, pain and discharge.  Respiratory: Negative for cough, hemoptysis, sputum production, shortness of breath and wheezing.   Cardiovascular: Negative for chest pain, palpitations, claudication, leg swelling and PND.  Gastrointestinal: Negative for heartburn, nausea, vomiting, abdominal pain, diarrhea, constipation, blood in stool and melena.  Genitourinary: Negative for dysuria, urgency, frequency and hematuria.  Musculoskeletal: Positive for joint pain. Negative for myalgias and falls.  Skin: Negative for itching and rash.  Neurological: Negative for dizziness, tingling, tremors, sensory change, speech change, focal weakness, seizures, loss of consciousness, weakness and headaches.  Endo/Heme/Allergies: Does not bruise/bleed easily.  Psychiatric/Behavioral: Negative for depression, suicidal ideas, memory loss and substance abuse. The patient is not nervous/anxious and does not have insomnia.     PHYSICAL  EXAMINATION  ECOG PERFORMANCE STATUS: 0 - Asymptomatic  Filed Vitals:   07/04/14 1430  BP: 168/78  Pulse: 64  Temp: 97.9 F (36.6 C)  Resp: 18    Physical Exam  Constitutional: She is oriented to person, place, and time and well-developed, well-nourished, and in no distress.  Wearing hair prosthesis, well groomed  HENT:  Head: Normocephalic and atraumatic.  Nose: Nose normal.  Mouth/Throat: Oropharynx is clear and moist. No oropharyngeal exudate.  Eyes: Conjunctivae and EOM are normal. Pupils are equal, round, and reactive to light. Right eye exhibits no discharge. Left eye exhibits no discharge. No scleral icterus.  Neck: Normal range of motion. Neck supple. No tracheal deviation present. No thyromegaly present.  Cardiovascular: Normal rate, regular rhythm and normal heart sounds.  Exam reveals no gallop and no friction rub.   No murmur heard. Pulmonary/Chest: Effort normal and breath sounds normal. She has no wheezes. She has no rales.  Abdominal: Soft. Bowel sounds are normal. She exhibits no distension and no mass. There is no tenderness. There is no rebound and no guarding.  Musculoskeletal: Normal range of motion. She exhibits no edema.  Lymphadenopathy:  She has no cervical adenopathy.  Neurological: She is alert and oriented to person, place, and time. She has normal reflexes. No cranial nerve deficit. Gait normal. Coordination normal.  Skin: Skin is warm and dry. No rash noted.  Psychiatric: Mood, memory, affect and judgment normal.  Nursing note and vitals reviewed.   LABORATORY DATA:  CBC    Component Value Date/Time   WBC 3.7* 07/19/2014 1100   RBC 3.49* 07/19/2014 1100   HGB 9.8* 07/19/2014 1100   HCT 29.8* 07/19/2014 1100   PLT 184 07/19/2014 1100   MCV 85.4 07/19/2014 1100   MCH 28.1 07/19/2014 1100   MCHC 32.9 07/19/2014 1100   RDW 16.6* 07/19/2014 1100   LYMPHSABS 1.3 07/19/2014 1100   MONOABS 0.1 07/19/2014 1100   EOSABS 0.0 07/19/2014 1100    BASOSABS 0.0 07/19/2014 1100   CMP     Component Value Date/Time   NA 140 07/19/2014 1100   K 3.9 07/19/2014 1100   CL 105 07/19/2014 1100   CO2 30 07/19/2014 1100   GLUCOSE 105* 07/19/2014 1100   BUN 16 07/19/2014 1100   CREATININE 0.98 07/19/2014 1100   CREATININE 0.97 02/14/2014 0758   CALCIUM 8.8 07/19/2014 1100   PROT 6.8 07/19/2014 1100   ALBUMIN 3.7 07/19/2014 1100   AST 16 07/19/2014 1100   ALT 16 07/19/2014 1100   ALKPHOS 88 07/19/2014 1100   BILITOT 0.7 07/19/2014 1100   GFRNONAA 52* 07/19/2014 1100   GFRNONAA 52* 11/04/2013 0820   GFRAA 60* 07/19/2014 1100   GFRAA 60 11/04/2013 0820     ASSESSMENT and THERAPY PLAN:    Pancreatic cancer metastasized to liver 81 year old female with stage IV pancreatic cancer. She is currently on Gemzar and Abraxane and does reasonably well. She is active and gets out daily. She gets dressed daily. Last imaging studies showed improved disease and clinically she is doing very well. I recommended ongoing therapy and she is agreeable. We will proceed with her next cycle of treatment and plan on seeing her back in 2 weeks with repeat laboratory studies.    All questions were answered. The patient knows to call the clinic with any problems, questions or concerns. We can certainly see the patient much sooner if necessary.   Molli Hazard 07/25/2014

## 2014-07-26 ENCOUNTER — Telehealth: Payer: Self-pay | Admitting: Family Medicine

## 2014-07-26 NOTE — Telephone Encounter (Signed)
Pt scheduled for in the am

## 2014-07-27 ENCOUNTER — Ambulatory Visit (INDEPENDENT_AMBULATORY_CARE_PROVIDER_SITE_OTHER): Payer: Medicare Other | Admitting: Family Medicine

## 2014-07-27 ENCOUNTER — Encounter: Payer: Self-pay | Admitting: Family Medicine

## 2014-07-27 VITALS — BP 134/74 | HR 82 | Temp 98.8°F | Resp 116 | Ht 63.5 in | Wt 158.0 lb

## 2014-07-27 DIAGNOSIS — N3000 Acute cystitis without hematuria: Secondary | ICD-10-CM

## 2014-07-27 DIAGNOSIS — J301 Allergic rhinitis due to pollen: Secondary | ICD-10-CM

## 2014-07-27 DIAGNOSIS — R059 Cough, unspecified: Secondary | ICD-10-CM

## 2014-07-27 DIAGNOSIS — I10 Essential (primary) hypertension: Secondary | ICD-10-CM

## 2014-07-27 DIAGNOSIS — R05 Cough: Secondary | ICD-10-CM | POA: Diagnosis not present

## 2014-07-27 DIAGNOSIS — R195 Other fecal abnormalities: Secondary | ICD-10-CM | POA: Diagnosis not present

## 2014-07-27 DIAGNOSIS — R058 Other specified cough: Secondary | ICD-10-CM | POA: Insufficient documentation

## 2014-07-27 LAB — POCT URINALYSIS DIPSTICK
Bilirubin, UA: NEGATIVE
Blood, UA: NEGATIVE
Glucose, UA: NEGATIVE
Ketones, UA: NEGATIVE
Leukocytes, UA: NEGATIVE
NITRITE UA: NEGATIVE
PH UA: 6
PROTEIN UA: 30
SPEC GRAV UA: 1.02
Urobilinogen, UA: 0.2

## 2014-07-27 MED ORDER — BENZONATATE 200 MG PO CAPS: 200.0000 mg | ORAL_CAPSULE | Freq: Three times a day (TID) | ORAL | Status: DC | PRN

## 2014-07-27 NOTE — Patient Instructions (Signed)
F/u  As before  Pls call and check on mammogram date    No infection in urine  Decongestant tablets are sent for your cough  PLS CALL if you get fever , chills or begin to feel worse  Drink a lot of fluids, and honey is also soothing for cough  Thankful about your good response treatment for you pancreatic cancer

## 2014-07-30 DIAGNOSIS — N3000 Acute cystitis without hematuria: Secondary | ICD-10-CM | POA: Insufficient documentation

## 2014-07-30 DIAGNOSIS — R195 Other fecal abnormalities: Secondary | ICD-10-CM | POA: Insufficient documentation

## 2014-07-30 DIAGNOSIS — N3001 Acute cystitis with hematuria: Secondary | ICD-10-CM | POA: Insufficient documentation

## 2014-07-30 NOTE — Assessment & Plan Note (Signed)
No symptoms or signs of infection, though at inc risk as currently on chemo for pancreatic cancer. Tessalon perles prescribed, pt to call in for worsening symptoms, antibiotc withheld at this time

## 2014-07-30 NOTE — Assessment & Plan Note (Signed)
Controlled, no change in medication  

## 2014-07-30 NOTE — Assessment & Plan Note (Signed)
Normal urinalysis, pt reassured

## 2014-07-30 NOTE — Assessment & Plan Note (Signed)
Increased and uncontrolled symptoms causing cough

## 2014-07-30 NOTE — Assessment & Plan Note (Signed)
Acute onset, not excessive in number, OTC antidiarrheal if needed

## 2014-07-30 NOTE — Progress Notes (Signed)
   Subjective:    Patient ID: [name], female    DOB: 1928-06-08, 81 y.o.   MRN: [id number]  HPI Pt in with a 4 day h/o excess cough,  Non productive, no fever or chills, excess mucus noted from nose which is clear, denies ear pain or sore throat. Tolerating chemo well with good response C/o loose stool and more frequent, tending t o defecate with urination , which is more frequent in past 3 days, also some dysuria and urgency noted. Denies flank pain or hematuria    Review of Systems See HPI . Denies chest pains, palpitations and leg swelling Denies abdominal pain, nausea, vomiting,.    Denies joint pain, swelling and limitation in mobility. Denies headaches, seizures, numbness, or tingling. Denies depression, uncontrolled  anxiety or insomnia. Denies skin break down or rash.        Objective:   Physical Exam BP 134/74 mmHg  Pulse 82  Temp(Src) 98.8 F (37.1 C) (Oral)  Resp 116  Ht 5' 3.5" (1.613 m)  Wt 158 lb (71.668 kg)  BMI 27.55 kg/m2  SpO2 94% Patient alert and oriented and in no cardiopulmonary distress.  HEENT: No facial asymmetry, EOMI,   oropharynx pink and moist.No exudate.  Neck supple no JVD, no mass.TM clear, nasal mucosa erythematous and edematous  Chest: Clear to auscultation bilaterally.No crackles or wheezes  CVS: S1, S2 no murmurs, no S3.Regular rate.  ABD: Soft non tender.   Ext: No edema  MS: Adequate though reduced ROM spine, shoulders, hips and knees.  Skin: Intact, no ulcerations or rash noted.  Psych: Good eye contact, normal affect. Memory intact mildly  anxious not  depressed appearing.  CNS: CN 2-12 intact, power,  normal throughout.no focal deficits noted.        Assessment & Plan:  Cough No symptoms or signs of infection, though at inc risk as currently on chemo for pancreatic cancer. Tessalon perles prescribed, pt to call in for worsening symptoms, antibiotc withheld at this time   ALLERGIC RHINITIS,  SEASONAL Increased and uncontrolled symptoms causing cough   Loose stools Acute onset, not excessive in number, OTC antidiarrheal if needed   Essential hypertension Controlled, no change in medication    Acute cystitis without hematuria Normal urinalysis, pt reassured

## 2014-08-03 ENCOUNTER — Ambulatory Visit (HOSPITAL_COMMUNITY): Payer: BLUE CROSS/BLUE SHIELD

## 2014-08-03 ENCOUNTER — Inpatient Hospital Stay (HOSPITAL_COMMUNITY): Payer: Medicare Other

## 2014-08-03 ENCOUNTER — Ambulatory Visit (HOSPITAL_COMMUNITY): Payer: Medicare Other | Admitting: Hematology & Oncology

## 2014-08-04 ENCOUNTER — Ambulatory Visit (HOSPITAL_COMMUNITY): Payer: BLUE CROSS/BLUE SHIELD

## 2014-08-04 ENCOUNTER — Ambulatory Visit (HOSPITAL_COMMUNITY)
Admission: RE | Admit: 2014-08-04 | Discharge: 2014-08-04 | Disposition: A | Discharge status: Home / Self Care | Payer: Medicare Other | Source: Ambulatory Visit | Attending: Family Medicine | Admitting: Family Medicine

## 2014-08-04 DIAGNOSIS — Z1231 Encounter for screening mammogram for malignant neoplasm of breast: Secondary | ICD-10-CM | POA: Diagnosis not present

## 2014-08-09 ENCOUNTER — Encounter (HOSPITAL_COMMUNITY): Payer: Self-pay | Admitting: Hematology & Oncology

## 2014-08-09 ENCOUNTER — Other Ambulatory Visit (HOSPITAL_COMMUNITY): Payer: Self-pay | Admitting: Hematology & Oncology

## 2014-08-09 ENCOUNTER — Encounter (HOSPITAL_BASED_OUTPATIENT_CLINIC_OR_DEPARTMENT_OTHER): Payer: Medicare Other | Admitting: Hematology & Oncology

## 2014-08-09 ENCOUNTER — Encounter (HOSPITAL_BASED_OUTPATIENT_CLINIC_OR_DEPARTMENT_OTHER): Payer: Medicare Other

## 2014-08-09 VITALS — BP 141/54 | HR 57 | Temp 97.8°F | Resp 18 | Wt 157.0 lb

## 2014-08-09 DIAGNOSIS — C787 Secondary malignant neoplasm of liver and intrahepatic bile duct: Secondary | ICD-10-CM

## 2014-08-09 DIAGNOSIS — C252 Malignant neoplasm of tail of pancreas: Secondary | ICD-10-CM | POA: Diagnosis not present

## 2014-08-09 DIAGNOSIS — Z5111 Encounter for antineoplastic chemotherapy: Secondary | ICD-10-CM | POA: Diagnosis not present

## 2014-08-09 DIAGNOSIS — C259 Malignant neoplasm of pancreas, unspecified: Secondary | ICD-10-CM

## 2014-08-09 DIAGNOSIS — C801 Malignant (primary) neoplasm, unspecified: Secondary | ICD-10-CM | POA: Diagnosis not present

## 2014-08-09 DIAGNOSIS — D509 Iron deficiency anemia, unspecified: Secondary | ICD-10-CM

## 2014-08-09 LAB — COMPREHENSIVE METABOLIC PANEL
ALT: 14 U/L (ref 0–35)
AST: 20 U/L (ref 0–37)
Albumin: 3.8 g/dL (ref 3.5–5.2)
Alkaline Phosphatase: 78 U/L (ref 39–117)
Anion gap: 8 (ref 5–15)
BILIRUBIN TOTAL: 0.5 mg/dL (ref 0.3–1.2)
BUN: 20 mg/dL (ref 6–23)
CALCIUM: 8.9 mg/dL (ref 8.4–10.5)
CHLORIDE: 108 mmol/L (ref 96–112)
CO2: 26 mmol/L (ref 19–32)
Creatinine, Ser: 1.06 mg/dL (ref 0.50–1.10)
GFR calc Af Amer: 55 mL/min — ABNORMAL LOW (ref >90)
GFR calc non Af Amer: 47 mL/min — ABNORMAL LOW (ref >90)
Glucose, Bld: 128 mg/dL — ABNORMAL HIGH (ref 70–99)
POTASSIUM: 4.2 mmol/L (ref 3.5–5.1)
SODIUM: 142 mmol/L (ref 135–145)
Total Protein: 7.1 g/dL (ref 6.0–8.3)

## 2014-08-09 LAB — CBC WITH DIFFERENTIAL/PLATELET
Basophils Absolute: 0 10*3/uL (ref 0.0–0.1)
Basophils Relative: 1 % (ref 0–1)
EOS PCT: 1 % (ref 0–5)
Eosinophils Absolute: 0 10*3/uL (ref 0.0–0.7)
HEMATOCRIT: 31.5 % — AB (ref 36.0–46.0)
Hemoglobin: 10.1 g/dL — ABNORMAL LOW (ref 12.0–15.0)
LYMPHS ABS: 0.8 10*3/uL (ref 0.7–4.0)
LYMPHS PCT: 13 % (ref 12–46)
MCH: 27.8 pg (ref 26.0–34.0)
MCHC: 32.1 g/dL (ref 30.0–36.0)
MCV: 86.8 fL (ref 78.0–100.0)
MONOS PCT: 4 % (ref 3–12)
Monocytes Absolute: 0.2 10*3/uL (ref 0.1–1.0)
Neutro Abs: 5.3 10*3/uL (ref 1.7–7.7)
Neutrophils Relative %: 81 % — ABNORMAL HIGH (ref 43–77)
Platelets: 334 10*3/uL (ref 150–400)
RBC: 3.63 MIL/uL — ABNORMAL LOW (ref 3.87–5.11)
RDW: 17.2 % — ABNORMAL HIGH (ref 11.5–15.5)
WBC: 6.4 10*3/uL (ref 4.0–10.5)

## 2014-08-09 MED ORDER — LIDOCAINE-PRILOCAINE 2.5-2.5 % EX CREA: TOPICAL_CREAM | CUTANEOUS | Status: DC

## 2014-08-09 MED ORDER — SODIUM CHLORIDE 0.9 % IJ SOLN
10.0000 mL | INTRAMUSCULAR | Status: DC | PRN
  Administered 2014-08-09: 10 mL
  Filled 2014-08-09: qty 10

## 2014-08-09 MED ORDER — SODIUM CHLORIDE 0.9 % IV SOLN
Freq: Once | INTRAVENOUS | Status: AC
  Administered 2014-08-09: 8 mg via INTRAVENOUS
  Filled 2014-08-09: qty 4

## 2014-08-09 MED ORDER — DEXAMETHASONE SODIUM PHOSPHATE 10 MG/ML IJ SOLN: 10.0000 mg | Freq: Once | INTRAMUSCULAR | Status: DC

## 2014-08-09 MED ORDER — PACLITAXEL PROTEIN-BOUND CHEMO INJECTION 100 MG
125.0000 mg/m2 | Freq: Once | INTRAVENOUS | Status: AC
  Administered 2014-08-09: 225 mg via INTRAVENOUS
  Filled 2014-08-09: qty 45

## 2014-08-09 MED ORDER — SODIUM CHLORIDE 0.9 % IV SOLN
Freq: Once | INTRAVENOUS | Status: AC
  Administered 2014-08-09: 12:00:00 via INTRAVENOUS

## 2014-08-09 MED ORDER — SODIUM CHLORIDE 0.9 % IV SOLN: 8.0000 mg | Freq: Once | INTRAVENOUS | Status: DC

## 2014-08-09 MED ORDER — GEMCITABINE HCL CHEMO INJECTION 1 GM/26.3ML
1000.0000 mg/m2 | Freq: Once | INTRAVENOUS | Status: AC
  Administered 2014-08-09: 1786 mg via INTRAVENOUS
  Filled 2014-08-09: qty 46.97

## 2014-08-09 MED ORDER — HEPARIN SOD (PORK) LOCK FLUSH 100 UNIT/ML IV SOLN
500.0000 [IU] | Freq: Once | INTRAVENOUS | Status: AC | PRN
Start: 2014-08-09 — End: 2014-08-09
  Administered 2014-08-09: 500 [IU]
  Filled 2014-08-09: qty 5

## 2014-08-09 NOTE — Progress Notes (Signed)
[name], MD [address], [address] [place] [address]   DIAGNOSIS: Pancreatic cancer metastasized to liver   Staging form: Pancreas, AJCC 7th Edition     Clinical: Stage IV (T3, N1, M1) - Signed by Baird Cancer, PA-C on 03/16/2014     Pathologic: No stage assigned - Unsigned  CONTRAST MEDIA ALLERGY  SUMMARY OF ONCOLOGIC HISTORY:   Pancreatic cancer metastasized to liver   03/10/2014 Initial Diagnosis Pancreatic cancer metastasized to liver   03/22/2014 -  Chemotherapy Abraxane/Gemzar days 1, 8, every 28 days.  Day 15 was cancelled due to leukopenia and thrombocytopenia on day 15 cycle 1.   05/24/2014 Treatment Plan Change Day 8 of cycle 3 is held with ANC of 1.1   06/05/2014 Imaging CT C/A/P . Interval decrease in size of the pancreatic tail mass.Improved hepatic metastatic disease. No new lesions. No CT findings for metastatic disease involving the chest.    CURRENT THERAPY: Gemzar/Abraxane  INTERVAL HISTORY: [name] 81 y.o. female returns for follow-up of her pancreatic cancer. She is doing fairly well. She goes out shopping and occasionally out to eat. She visits with friends and family daily. Her sister reports that she tends to get tired out quickly but it does not limit her in her daily activities. She had a problem with a dry cough for about a week and saw her primary care physician. She states it is now resolved. She wishes to continue with treatment. She eats very well and denies any nausea, vomiting, or diarrhea.  MEDICAL HISTORY: Past Medical History  Diagnosis Date  . Hypertension 20 years   . Chronic diarrhea   . Anxiety   . Depression   . Kidney stone     hx/ crushed   . Hx of adenomatous colonic polyps     tubular adenomas, last found in 2008  . GERD (gastroesophageal reflux disease)   . Erosive esophagitis   . Schatzki's ring 08/26/10    Last dilated on EGD by Dr. Trevor Iha [facility], linear gastric erosions, BI hemigastrectomy  .  Hyperplastic colon polyp 03/19/10    tcs by Dr. Gala Romney  . Pancreatic cancer 02/2014  . Pancreatic cancer metastasized to liver 03/10/2014    has Essential hypertension; ALLERGIC RHINITIS, SEASONAL; GERD (gastroesophageal reflux disease); Anxiety; Primary generalized (osteo)arthritis; Back pain with radiation; IGT (impaired glucose tolerance); Anemia, iron deficiency; Elevated LFTs; Change in stool habits; Abnormal liver ultrasound; Pancreatic cancer metastasized to liver; Cough; Loose stools; and Acute cystitis without hematuria on her problem list.     is allergic to iohexol; aciphex; amlodipine besylate-valsartan; esomeprazole magnesium; omeprazole; penicillins; and ciprofloxacin.  Ms. Goldwasser had no medications administered during this visit.  SURGICAL HISTORY: Past Surgical History  Procedure Laterality Date  . Stomach ulcer  50 years ago     had some of her stomach removed   . Bunion removal      from both feet   . Left shoulder surgery  2009    DR HARRISON  . Colonoscopy  03/19/2010    DR Gala Romney,, normal TI, pancolonic diverticula, random colon bx neg., hyperplastic polyps removed  . Esophagogastroduodenoscopy  11/22/2003    DR Gala Romney, erosive RE, Billroth I  . Esophagogastroduodenoscopy  08/26/10    Dr. Gala Romney- moderate severe ERE, Scahtzki ring s/p dilation, Billroth I, linear gastric erosions, bx-gastric xanthelasma  . Breast lumpectomy Left   . Orif ankle fracture Right 05/22/2013    Procedure: OPEN REDUCTION INTERNAL FIXATION (ORIF) RIGHT ANKLE  FRACTURE;  Surgeon: Sanjuana Kava, MD;  Location: [facility];  Service: Orthopedics;  Laterality: Right;  . Cholecystectomy  1965     SOCIAL HISTORY: History   Social History  . Marital Status: Widowed    Spouse Name: N/A  . Number of Children: 2  . Years of Education: N/A   Occupational History  . retired from Teacher, adult education    Social History Main Topics  . Smoking status: Former Smoker    Types: Cigarettes    Quit date: 11/09/2011  .  Smokeless tobacco: Never Used     Comment: quit a few weeks ago   . Alcohol Use: No  . Drug Use: No  . Sexual Activity: No   Other Topics Concern  . Not on file   Social History Narrative    FAMILY HISTORY: Family History  Problem Relation Age of Onset  . Hypertension Mother   . Kidney failure Brother     X1 ON DIALYSIS  . Diabetes Sister   . Cancer Sister     X2  . Hypertension Father   . Liver disease Neg Hx   . Colon cancer Neg Hx     Review of Systems  Constitutional: Positive for malaise/fatigue. Negative for fever, chills and weight loss.  HENT: Negative for congestion, hearing loss, nosebleeds, sore throat and tinnitus.   Eyes: Negative for blurred vision, double vision, pain and discharge.  Respiratory: Negative for cough, hemoptysis, sputum production, shortness of breath and wheezing.   Cardiovascular: Negative for chest pain, palpitations, claudication, leg swelling and PND.  Gastrointestinal: Negative for heartburn, nausea, vomiting, abdominal pain, diarrhea, constipation, blood in stool and melena.  Genitourinary: Negative for dysuria, urgency, frequency and hematuria.  Musculoskeletal: Negative for myalgias, joint pain and falls.  Skin: Negative for itching and rash.  Neurological: Positive for weakness. Negative for dizziness, tingling, tremors, sensory change, speech change, focal weakness, seizures, loss of consciousness and headaches.  Endo/Heme/Allergies: Does not bruise/bleed easily.  Psychiatric/Behavioral: Negative for depression, suicidal ideas, memory loss and substance abuse. The patient is not nervous/anxious and does not have insomnia.     PHYSICAL EXAMINATION  ECOG PERFORMANCE STATUS: 0 - Asymptomatic  Filed Vitals:   08/09/14 0837  BP: 141/54  Pulse: 57  Temp: 97.8 F (36.6 C)  Resp: 18    Physical Exam  Constitutional: She is oriented to person, place, and time and well-developed, well-nourished, and in no distress.  Wearing hair  prosthesis, well groomed  HENT:  Head: Normocephalic and atraumatic.  Nose: Nose normal.  Mouth/Throat: Oropharynx is clear and moist. No oropharyngeal exudate.  Eyes: Conjunctivae and EOM are normal. Pupils are equal, round, and reactive to light. Right eye exhibits no discharge. Left eye exhibits no discharge. No scleral icterus.  Neck: Normal range of motion. Neck supple. No tracheal deviation present. No thyromegaly present.  Cardiovascular: Normal rate, regular rhythm and normal heart sounds.  Exam reveals no gallop and no friction rub.   No murmur heard. Pulmonary/Chest: Effort normal and breath sounds normal. She has no wheezes. She has no rales.  Abdominal: Soft. Bowel sounds are normal. She exhibits no distension and no mass. There is no tenderness. There is no rebound and no guarding.  Musculoskeletal: Normal range of motion. She exhibits no edema.  Lymphadenopathy:    She has no cervical adenopathy.  Neurological: She is alert and oriented to person, place, and time. She has normal reflexes. No cranial nerve deficit. Gait normal. Coordination normal.  Skin: Skin is warm and  dry. No rash noted.  Psychiatric: Mood, memory, affect and judgment normal.  Nursing note and vitals reviewed.   LABORATORY DATA:  CBC    Component Value Date/Time   WBC 3.7* 07/19/2014 1100   RBC 3.49* 07/19/2014 1100   HGB 9.8* 07/19/2014 1100   HCT 29.8* 07/19/2014 1100   PLT 184 07/19/2014 1100   MCV 85.4 07/19/2014 1100   MCH 28.1 07/19/2014 1100   MCHC 32.9 07/19/2014 1100   RDW 16.6* 07/19/2014 1100   LYMPHSABS 1.3 07/19/2014 1100   MONOABS 0.1 07/19/2014 1100   EOSABS 0.0 07/19/2014 1100   BASOSABS 0.0 07/19/2014 1100   CMP     Component Value Date/Time   NA 140 07/19/2014 1100   K 3.9 07/19/2014 1100   CL 105 07/19/2014 1100   CO2 30 07/19/2014 1100   GLUCOSE 105* 07/19/2014 1100   BUN 16 07/19/2014 1100   CREATININE 0.98 07/19/2014 1100   CREATININE 0.97 02/14/2014 0758    CALCIUM 8.8 07/19/2014 1100   PROT 6.8 07/19/2014 1100   ALBUMIN 3.7 07/19/2014 1100   AST 16 07/19/2014 1100   ALT 16 07/19/2014 1100   ALKPHOS 88 07/19/2014 1100   BILITOT 0.7 07/19/2014 1100   GFRNONAA 52* 07/19/2014 1100   GFRNONAA 52* 11/04/2013 0820   GFRAA 60* 07/19/2014 1100   GFRAA 60 11/04/2013 0820     ASSESSMENT and THERAPY PLAN:   STAGE IV Pancreatic Cancer  81 year old female on Gemzar/Abraxane for stage IV pancreatic cancer. She is doing and has done remarkably well. She wishes to continue with therapy. Last imaging in January was improved.  We will consider re-imaging at the end of April. Her performance status is a 1.  All questions were answered. The patient knows to call the clinic with any problems, questions or concerns. We can certainly see the patient much sooner if necessary.  This note was signed electronically. Molli Hazard MD 08/09/2014

## 2014-08-09 NOTE — Patient Instructions (Signed)
..  [place] at [facility] Discharge Instructions  RECOMMENDATIONS MADE BY THE CONSULTANT AND ANY TEST RESULTS WILL BE SENT TO YOUR REFERRING PHYSICIAN.  Exam per Dr. Whitney Muse  Return to see T. Kefalas PA-C next week  We will consider re-imaging at the end of April.   Thank you for choosing [place] at [facility] to provide your oncology and hematology care.  To afford each patient quality time with our provider, please arrive at least 15 minutes before your scheduled appointment time.    You need to re-schedule your appointment should you arrive 10 or more minutes late.  We strive to give you quality time with our providers, and arriving late affects you and other patients whose appointments are after yours.  Also, if you no show three or more times for appointments you may be dismissed from the clinic at the providers discretion.     Again, thank you for choosing [facility].  Our hope is that these requests will decrease the amount of time that you wait before being seen by our physicians.       _____________________________________________________________  Should you have questions after your visit to [facility], please contact our office at [phone] between the hours of 8:30 a.m. and 4:30 p.m.  Voicemails left after 4:30 p.m. will not be returned until the following business day.  For prescription refill requests, have your pharmacy contact our office.

## 2014-08-09 NOTE — Progress Notes (Signed)
1425:  Tolerated treatment w/o adverse reaction; a&ox4; in no distress.  Discharged ambulatory in c/o family.

## 2014-08-09 NOTE — Patient Instructions (Signed)
[facility] Discharge Instructions for Patients Receiving Chemotherapy  Today you received the following chemotherapy agents:  Gemzar and Abraxane.   If you develop nausea and vomiting, or diarrhea that is not controlled by your medication, call the clinic.  The clinic phone number is [phone]. Office hours are Monday-Friday 8:30am-5:00pm.  BELOW ARE SYMPTOMS THAT SHOULD BE REPORTED IMMEDIATELY:  *FEVER GREATER THAN 101.0 F  *CHILLS WITH OR WITHOUT FEVER  NAUSEA AND VOMITING THAT IS NOT CONTROLLED WITH YOUR NAUSEA MEDICATION  *UNUSUAL SHORTNESS OF BREATH  *UNUSUAL BRUISING OR BLEEDING  TENDERNESS IN MOUTH AND THROAT WITH OR WITHOUT PRESENCE OF ULCERS  *URINARY PROBLEMS  *BOWEL PROBLEMS  UNUSUAL RASH Items with * indicate a potential emergency and should be followed up as soon as possible. If you have an emergency after office hours please contact your primary care physician or go to the nearest emergency department.  Please call the clinic during office hours if you have any questions or concerns.   You may also contact the Patient Navigator at [phone] should you have any questions or need assistance in obtaining follow up care. _____________________________________________________________________ Have you asked about our STAR program?    STAR stands for Survivorship Training and Rehabilitation, and this is a nationally recognized cancer care program that focuses on survivorship and rehabilitation.  Cancer and cancer treatments may cause problems, such as, pain, making you feel tired and keeping you from doing the things that you need or want to do. Cancer rehabilitation can help. Our goal is to reduce these troubling effects and help you have the best quality of life possible.  You may receive a survey from a nurse that asks questions about your current state of health.  Based on the survey results, all eligible patients will be referred to the [facility]  program for an evaluation so we can better serve you! A frequently asked questions sheet is available upon request.

## 2014-08-10 ENCOUNTER — Ambulatory Visit (INDEPENDENT_AMBULATORY_CARE_PROVIDER_SITE_OTHER): Payer: Medicare Other | Admitting: Family Medicine

## 2014-08-10 ENCOUNTER — Encounter: Payer: Self-pay | Admitting: Family Medicine

## 2014-08-10 VITALS — BP 140/80 | HR 63 | Resp 16 | Ht 64.0 in | Wt 158.0 lb

## 2014-08-10 DIAGNOSIS — I10 Essential (primary) hypertension: Secondary | ICD-10-CM

## 2014-08-10 DIAGNOSIS — K219 Gastro-esophageal reflux disease without esophagitis: Secondary | ICD-10-CM

## 2014-08-10 DIAGNOSIS — C259 Malignant neoplasm of pancreas, unspecified: Secondary | ICD-10-CM

## 2014-08-10 DIAGNOSIS — F419 Anxiety disorder, unspecified: Secondary | ICD-10-CM | POA: Diagnosis not present

## 2014-08-10 DIAGNOSIS — J301 Allergic rhinitis due to pollen: Secondary | ICD-10-CM

## 2014-08-10 DIAGNOSIS — R05 Cough: Secondary | ICD-10-CM | POA: Diagnosis not present

## 2014-08-10 DIAGNOSIS — C787 Secondary malignant neoplasm of liver and intrahepatic bile duct: Secondary | ICD-10-CM

## 2014-08-10 DIAGNOSIS — R058 Other specified cough: Secondary | ICD-10-CM

## 2014-08-10 LAB — CANCER ANTIGEN 19-9: CA 19-9: 33 U/mL (ref 0–35)

## 2014-08-10 NOTE — Patient Instructions (Signed)
Annual wellness in 4 month, call if you need me before  Take prednisone tablets that you have one twice daily for the next 5 days ,a nd continue daily flonase, that should help a lot, the cough is from uncontrolled allergies, lungs are clear and you have not had fever

## 2014-08-16 ENCOUNTER — Encounter (HOSPITAL_COMMUNITY): Payer: Medicare Other | Attending: Hematology and Oncology

## 2014-08-16 ENCOUNTER — Encounter (HOSPITAL_BASED_OUTPATIENT_CLINIC_OR_DEPARTMENT_OTHER): Payer: Medicare Other | Admitting: Oncology

## 2014-08-16 DIAGNOSIS — C252 Malignant neoplasm of tail of pancreas: Secondary | ICD-10-CM | POA: Diagnosis not present

## 2014-08-16 DIAGNOSIS — C787 Secondary malignant neoplasm of liver and intrahepatic bile duct: Secondary | ICD-10-CM | POA: Insufficient documentation

## 2014-08-16 DIAGNOSIS — C801 Malignant (primary) neoplasm, unspecified: Secondary | ICD-10-CM | POA: Diagnosis not present

## 2014-08-16 DIAGNOSIS — C259 Malignant neoplasm of pancreas, unspecified: Secondary | ICD-10-CM | POA: Insufficient documentation

## 2014-08-16 DIAGNOSIS — D509 Iron deficiency anemia, unspecified: Secondary | ICD-10-CM

## 2014-08-16 DIAGNOSIS — Z5111 Encounter for antineoplastic chemotherapy: Secondary | ICD-10-CM

## 2014-08-16 DIAGNOSIS — R530 Neoplastic (malignant) related fatigue: Secondary | ICD-10-CM

## 2014-08-16 LAB — COMPREHENSIVE METABOLIC PANEL
ALT: 15 U/L (ref 0–35)
ANION GAP: 9 (ref 5–15)
AST: 14 U/L (ref 0–37)
Albumin: 3.8 g/dL (ref 3.5–5.2)
Alkaline Phosphatase: 73 U/L (ref 39–117)
BILIRUBIN TOTAL: 0.7 mg/dL (ref 0.3–1.2)
BUN: 17 mg/dL (ref 6–23)
CALCIUM: 8.8 mg/dL (ref 8.4–10.5)
CHLORIDE: 107 mmol/L (ref 96–112)
CO2: 24 mmol/L (ref 19–32)
CREATININE: 0.9 mg/dL (ref 0.50–1.10)
GFR, EST AFRICAN AMERICAN: 67 mL/min — AB (ref >90)
GFR, EST NON AFRICAN AMERICAN: 58 mL/min — AB (ref >90)
Glucose, Bld: 111 mg/dL — ABNORMAL HIGH (ref 70–99)
Potassium: 3.8 mmol/L (ref 3.5–5.1)
Sodium: 140 mmol/L (ref 135–145)
Total Protein: 6.8 g/dL (ref 6.0–8.3)

## 2014-08-16 LAB — CBC WITH DIFFERENTIAL/PLATELET
BASOS ABS: 0 10*3/uL (ref 0.0–0.1)
Basophils Relative: 1 % (ref 0–1)
EOS PCT: 0 % (ref 0–5)
Eosinophils Absolute: 0 10*3/uL (ref 0.0–0.7)
HCT: 30.9 % — ABNORMAL LOW (ref 36.0–46.0)
Hemoglobin: 10.1 g/dL — ABNORMAL LOW (ref 12.0–15.0)
LYMPHS PCT: 23 % (ref 12–46)
Lymphs Abs: 0.9 10*3/uL (ref 0.7–4.0)
MCH: 27.8 pg (ref 26.0–34.0)
MCHC: 32.7 g/dL (ref 30.0–36.0)
MCV: 85.1 fL (ref 78.0–100.0)
Monocytes Absolute: 0.1 10*3/uL (ref 0.1–1.0)
Monocytes Relative: 3 % (ref 3–12)
Neutro Abs: 2.8 10*3/uL (ref 1.7–7.7)
Neutrophils Relative %: 73 % (ref 43–77)
Platelets: 161 10*3/uL (ref 150–400)
RBC: 3.63 MIL/uL — AB (ref 3.87–5.11)
RDW: 16.9 % — ABNORMAL HIGH (ref 11.5–15.5)
WBC: 3.9 10*3/uL — ABNORMAL LOW (ref 4.0–10.5)

## 2014-08-16 MED ORDER — HEPARIN SOD (PORK) LOCK FLUSH 100 UNIT/ML IV SOLN
500.0000 [IU] | Freq: Once | INTRAVENOUS | Status: AC | PRN
  Administered 2014-08-16: 500 [IU]

## 2014-08-16 MED ORDER — SODIUM CHLORIDE 0.9 % IV SOLN: 8.0000 mg | Freq: Once | INTRAVENOUS | Status: DC

## 2014-08-16 MED ORDER — PREDNISONE 5 MG PO TABS: 5.0000 mg | ORAL_TABLET | Freq: Every day | ORAL | Status: DC

## 2014-08-16 MED ORDER — DEXAMETHASONE SODIUM PHOSPHATE 10 MG/ML IJ SOLN: 10.0000 mg | Freq: Once | INTRAMUSCULAR | Status: DC

## 2014-08-16 MED ORDER — SODIUM CHLORIDE 0.9 % IV SOLN
Freq: Once | INTRAVENOUS | Status: AC
  Administered 2014-08-16: 8 mg via INTRAVENOUS
  Filled 2014-08-16 (×3): qty 4

## 2014-08-16 MED ORDER — SODIUM CHLORIDE 0.9 % IV SOLN
Freq: Once | INTRAVENOUS | Status: DC
  Filled 2014-08-16: qty 1

## 2014-08-16 MED ORDER — SODIUM CHLORIDE 0.9 % IJ SOLN
10.0000 mL | INTRAMUSCULAR | Status: DC | PRN
  Administered 2014-08-16: 10 mL
  Filled 2014-08-16: qty 10

## 2014-08-16 MED ORDER — SODIUM CHLORIDE 0.9 % IV SOLN
Freq: Once | INTRAVENOUS | Status: AC
  Administered 2014-08-16: 11:00:00 via INTRAVENOUS

## 2014-08-16 MED ORDER — PACLITAXEL PROTEIN-BOUND CHEMO INJECTION 100 MG
125.0000 mg/m2 | Freq: Once | INTRAVENOUS | Status: AC
  Administered 2014-08-16: 225 mg via INTRAVENOUS
  Filled 2014-08-16: qty 45

## 2014-08-16 MED ORDER — HEPARIN SOD (PORK) LOCK FLUSH 100 UNIT/ML IV SOLN
INTRAVENOUS | Status: AC
  Filled 2014-08-16: qty 5

## 2014-08-16 MED ORDER — SODIUM CHLORIDE 0.9 % IV SOLN
1000.0000 mg/m2 | Freq: Once | INTRAVENOUS | Status: AC
  Administered 2014-08-16: 1786 mg via INTRAVENOUS
  Filled 2014-08-16: qty 46.97

## 2014-08-16 NOTE — Progress Notes (Signed)
Tolerated chemo well. 

## 2014-08-16 NOTE — Assessment & Plan Note (Addendum)
Stage IV Pancreatic Cancer, on systemic therapy with Abraxane and Gemzar in a day 1, 8 fashion every 28 days.  Presenting for day 8 of cycle 6 today.    Pre-chemotherapy labs as planned.   She is expecting restaging scans at the end of this month, which can be done, but with her response in tumor marker and her clinical response, it is not necessary to restage her with CT scans at this time.  I have deferred this decision to her and she will let me know what she wants.  It is reasonable to avoid restaging scans at this time given her biochemical and clinical response, but she has been told that we may want to perform scans after this cycle, so I am willing to order these tests if she wishes.   She notes Grade 1 peripheral neuropathy, from chemotherapy.  She maintains fine motor skills and denies any issues associated with interference with QOL, ADLs, and IADLs.  We will continue with therapy as planned.   Future cycles built in treatment plan.  Most recent CA 19-9 is WNL.  Addendum: "I want to have pictures."  Therefore, I have ordered a MRI abd/pelvis w and wo contrast within the next 2 weeks.  No need for imaging of chest due to negative disease in chest in the setting of a declining CA 19-9.

## 2014-08-16 NOTE — Addendum Note (Signed)
Addended by: Baird Cancer on: 08/16/2014 10:44 AM   Modules accepted: Orders

## 2014-08-16 NOTE — Patient Instructions (Addendum)
[place] at [facility] Discharge Instructions  RECOMMENDATIONS MADE BY THE CONSULTANT AND ANY TEST RESULTS WILL BE SENT TO YOUR REFERRING PHYSICIAN. You received chemo today and the PA talked with your for your office visit. Your next appt is on 09-06-2014. Call for any concerns or questions.You will get an MRI before your next apptointment.  Thank you for choosing [place] at [facility] to provide your oncology and hematology care.  To afford each patient quality time with our provider, please arrive at least 15 minutes before your scheduled appointment time.    You need to re-schedule your appointment should you arrive 10 or more minutes late.  We strive to give you quality time with our providers, and arriving late affects you and other patients whose appointments are after yours.  Also, if you no show three or more times for appointments you may be dismissed from the clinic at the providers discretion.     Again, thank you for choosing [facility].  Our hope is that these requests will decrease the amount of time that you wait before being seen by our physicians.       _____________________________________________________________  Should you have questions after your visit to [facility], please contact our office at [phone] between the hours of 8:30 a.m. and 4:30 p.m.  Voicemails left after 4:30 p.m. will not be returned until the following business day.  For prescription refill requests, have your pharmacy contact our office.

## 2014-08-16 NOTE — Progress Notes (Addendum)
[name] Nakayama, MD [address] [place] [address]  Pancreatic cancer metastasized to liver - Plan: MR Abdomen W Wo Contrast, CANCELED: CT Abdomen Pelvis W Contrast  Neoplastic malignant related fatigue - Plan: predniSONE (DELTASONE) 5 MG tablet  CURRENT THERAPY: Gemzar/Abraxane, presenting for Day 8 of cycle #6 today.  INTERVAL HISTORY: [name] 81 y.o. female returns for followup of Stage IV Pancreatic Cancer, on systemic therapy with Abraxane and Gemzar in a day 1, 8 fashion every 28 days.    Pancreatic cancer metastasized to liver   03/10/2014 Initial Diagnosis Pancreatic cancer metastasized to liver   03/22/2014 -  Chemotherapy Abraxane/Gemzar days 1, 8, every 28 days.  Day 15 was cancelled due to leukopenia and thrombocytopenia on day 15 cycle 1.   05/24/2014 Treatment Plan Change Day 8 of cycle 3 is held with ANC of 1.1   06/05/2014 Imaging CT C/A/P . Interval decrease in size of the pancreatic tail mass.Improved hepatic metastatic disease. No new lesions. No CT findings for metastatic disease involving the chest.   08/09/2014 Tumor Marker CA 19-9= 33 (WNL)    I personally reviewed and went over laboratory results with the patient.  The results are noted within this dictation.  Her CA 19-9 is now WNL.  I explained to her what her cancer marker is.  I personally reviewed and went over radiographic studies with the patient.  The results are noted within this dictation.  Recent mammogram was good.  She notes grade 1 peripheral neuropathy which is localized to her fingertips and toe tips.  She denies any interference with ADLs and IADLs.  She is able to button buttons and manipulate her fingertips.  She denies any changes or difficulties with ambulation.  Oncologically, she denies any complaints and ROS questioning is negative.   Past Medical History  Diagnosis Date  . Hypertension 20 years   . Chronic diarrhea   . Anxiety   . Depression   .  Kidney stone     hx/ crushed   . Hx of adenomatous colonic polyps     tubular adenomas, last found in 2008  . GERD (gastroesophageal reflux disease)   . Erosive esophagitis   . Schatzki's ring 08/26/10    Last dilated on EGD by Dr. Trevor I[facility], linear gastric erosions, BI hemigastrectomy  . Hyperplastic colon polyp 03/19/10    tcs by Dr. Gala Romney  . Pancreatic cancer 02/2014  . Pancreatic cancer metastasized to liver 03/10/2014    has Essential hypertension; ALLERGIC RHINITIS, SEASONAL; GERD (gastroesophageal reflux disease); Anxiety; Primary generalized (osteo)arthritis; Back pain with radiation; IGT (impaired glucose tolerance); Anemia, iron deficiency; Elevated LFTs; Change in stool habits; Abnormal liver ultrasound; Pancreatic cancer metastasized to liver; Cough; Loose stools; and Acute cystitis without hematuria on her problem list.     is allergic to iohexol; aciphex; amlodipine besylate-valsartan; esomeprazole magnesium; omeprazole; penicillins; and ciprofloxacin.  Ms. Mauri had no medications administered during this visit.  Past Surgical History  Procedure Laterality Date  . Stomach ulcer  50 years ago     had some of her stomach removed   . Bunion removal      from both feet   . Left shoulder surgery  2009    DR HARRISON  . Colonoscopy  03/19/2010    DR Gala Romney,, normal TI, pancolonic diverticula, random colon bx neg., hyperplastic polyps removed  . Esophagogastroduodenoscopy  11/22/2003    DR Gala Romney, erosive RE, Billroth I  .  Esophagogastroduodenoscopy  08/26/10    Dr. Gala Romney- moderate severe ERE, Scahtzki ring s/p dilation, Billroth I, linear gastric erosions, bx-gastric xanthelasma  . Breast lumpectomy Left   . Orif ankle fracture Right 05/22/2013    Procedure: OPEN REDUCTION INTERNAL FIXATION (ORIF) RIGHT ANKLE FRACTURE;  Surgeon: Sanjuana Kava, MD;  Location: [facility];  Service: Orthopedics;  Laterality: Right;  . Cholecystectomy  1965     Denies any headaches,  dizziness, double vision, fevers, chills, night sweats, nausea, vomiting, diarrhea, constipation, chest pain, heart palpitations, shortness of breath, blood in stool, black tarry stool, urinary pain, urinary burning, urinary frequency, hematuria.   PHYSICAL EXAMINATION  ECOG PERFORMANCE STATUS: 0 - Asymptomatic  There were no vitals filed for this visit.  GENERAL:alert, no distress, well nourished, well developed, comfortable, cooperative and smiling, in chemotherapy recliner SKIN: skin color, texture, turgor are normal, no rashes or significant lesions HEAD: Normocephalic, No masses, lesions, tenderness or abnormalities EYES: normal, PERRLA, EOMI, Conjunctiva are pink and non-injected EARS: External ears normal OROPHARYNX:lips, buccal mucosa, and tongue normal and mucous membranes are moist  NECK: supple, trachea midline LYMPH:  not examined BREAST:not examined LUNGS: clear to auscultation  HEART: regular rate & rhythm ABDOMEN:non-tender and normal bowel sounds BACK: Back symmetric, no curvature. EXTREMITIES:less then 2 second capillary refill, no joint deformities, effusion, or inflammation, no skin discoloration, no cyanosis  NEURO: alert & oriented x 3 with fluent speech, no focal motor/sensory deficits   LABORATORY DATA: CBC    Component Value Date/Time   WBC 6.4 08/09/2014 0930   RBC 3.63* 08/09/2014 0930   HGB 10.1* 08/09/2014 0930   HCT 31.5* 08/09/2014 0930   PLT 334 08/09/2014 0930   MCV 86.8 08/09/2014 0930   MCH 27.8 08/09/2014 0930   MCHC 32.1 08/09/2014 0930   RDW 17.2* 08/09/2014 0930   LYMPHSABS 0.8 08/09/2014 0930   MONOABS 0.2 08/09/2014 0930   EOSABS 0.0 08/09/2014 0930   BASOSABS 0.0 08/09/2014 0930      Chemistry      Component Value Date/Time   NA 142 08/09/2014 0930   K 4.2 08/09/2014 0930   CL 108 08/09/2014 0930   CO2 26 08/09/2014 0930   BUN 20 08/09/2014 0930   CREATININE 1.06 08/09/2014 0930   CREATININE 0.97 02/14/2014 0758        Component Value Date/Time   CALCIUM 8.9 08/09/2014 0930   ALKPHOS 78 08/09/2014 0930   AST 20 08/09/2014 0930   ALT 14 08/09/2014 0930   BILITOT 0.5 08/09/2014 0930     Results for [name], [name] (MRN [id number]) as of 08/16/2014 08:01  Ref. Range 04/26/2014 09:24 05/17/2014 09:32 06/14/2014 10:50 07/12/2014 11:20 08/09/2014 09:30  CA 19-9 Latest Range: 0-35 U/mL 99.8 (H) 63.2 (H) 87.0 (H) 41 (H) 33     RADIOGRAPHIC STUDIES:  Mm Digital Screening Bilateral  08/04/2014   CLINICAL DATA:  Screening.  EXAM: DIGITAL SCREENING BILATERAL MAMMOGRAM WITH CAD  COMPARISON:  Previous exam(s).  ACR Breast Density Category b: There are scattered areas of fibroglandular density.  FINDINGS: There are no findings suspicious for malignancy. Images were processed with CAD.  IMPRESSION: No mammographic evidence of malignancy. A result letter of this screening mammogram will be mailed directly to the patient.  RECOMMENDATION: Screening mammogram in one year. (Code:[id number]-B-01Y)  BI-RADS CATEGORY  1: Negative.   Electronically Signed   By: Everlean Alstrom M.D.   On: 08/04/2014 14:57      ASSESSMENT AND PLAN:  Pancreatic cancer metastasized to liver Stage  IV Pancreatic Cancer, on systemic therapy with Abraxane and Gemzar in a day 1, 8 fashion every 28 days.  Presenting for day 8 of cycle 6 today.    Pre-chemotherapy labs as planned.   She is expecting restaging scans at the end of this month, which can be done, but with her response in tumor marker and her clinical response, it is not necessary to restage her with CT scans at this time.  I have deferred this decision to her and she will let me know what she wants.  It is reasonable to avoid restaging scans at this time given her biochemical and clinical response, but she has been told that we may want to perform scans after this cycle, so I am willing to order these tests if she wishes.   She notes Grade 1 peripheral neuropathy, from chemotherapy.  She maintains  fine motor skills and denies any issues associated with interference with QOL, ADLs, and IADLs.  We will continue with therapy as planned.   Future cycles built in treatment plan.  Most recent CA 19-9 is WNL.  Addendum: "I want to have pictures."  Therefore, I have ordered a MRI abd/pelvis w and wo contrast within the next 2 weeks.  No need for imaging of chest due to negative disease in chest in the setting of a declining CA 19-9.   THERAPY PLAN:  Continue with therapy as planned.  No clinical indication at this time for restaging scans, but patient has previously been prepared for restaging scans following cycle 6.  I have deferred this decision to the patient but advised her that it is not necessary given her biochemical response and clinical response.  All questions were answered. The patient knows to call the clinic with any problems, questions or concerns. We can certainly see the patient much sooner if necessary.  Patient and plan discussed with Dr. Ancil Linsey and she is in agreement with the aforementioned.   This note is electronically signed by: Robynn Pane 08/16/2014 10:42 AM

## 2014-08-16 NOTE — Patient Instructions (Signed)
[facility] Discharge Instructions for Patients Receiving Chemotherapy  Today you received the following chemotherapy agents Abraxane and Gemzar.  To help prevent nausea and vomiting after your treatment, we encourage you to take your nausea medication as instructed. If you develop nausea and vomiting that is not controlled by your nausea medication, call the clinic. If it is after clinic hours your family physician or the after hours number for the clinic or go to the Emergency Department. BELOW ARE SYMPTOMS THAT SHOULD BE REPORTED IMMEDIATELY:  *FEVER GREATER THAN 101.0 F  *CHILLS WITH OR WITHOUT FEVER  NAUSEA AND VOMITING THAT IS NOT CONTROLLED WITH YOUR NAUSEA MEDICATION  *UNUSUAL SHORTNESS OF BREATH  *UNUSUAL BRUISING OR BLEEDING  TENDERNESS IN MOUTH AND THROAT WITH OR WITHOUT PRESENCE OF ULCERS  *URINARY PROBLEMS  *BOWEL PROBLEMS  UNUSUAL RASH Items with * indicate a potential emergency and should be followed up as soon as possible.  Return as scheduled.  I have been informed and understand all the instructions given to me. I know to contact the clinic, my physician, or go to the Emergency Department if any problems should occur. I do not have any questions at this time, but understand that I may call the clinic during office hours or the Patient Navigator at [phone] should I have any questions or need assistance in obtaining follow up care.    __________________________________________  _____________  __________ Signature of Patient or Authorized Representative            Date                   Time    __________________________________________ Nurse's Signature

## 2014-08-23 ENCOUNTER — Ambulatory Visit (HOSPITAL_COMMUNITY): Payer: Medicare Other | Admitting: Oncology

## 2014-08-23 ENCOUNTER — Other Ambulatory Visit (HOSPITAL_COMMUNITY): Payer: Medicare Other

## 2014-08-29 ENCOUNTER — Other Ambulatory Visit (HOSPITAL_COMMUNITY): Payer: Medicare Other

## 2014-08-29 ENCOUNTER — Ambulatory Visit (HOSPITAL_COMMUNITY): Admission: RE | Admit: 2014-08-29 | Payer: Medicare Other | Source: Ambulatory Visit

## 2014-09-06 ENCOUNTER — Encounter (HOSPITAL_BASED_OUTPATIENT_CLINIC_OR_DEPARTMENT_OTHER): Payer: Medicare Other

## 2014-09-06 ENCOUNTER — Encounter (HOSPITAL_COMMUNITY): Payer: Medicare Other | Admitting: Hematology & Oncology

## 2014-09-06 ENCOUNTER — Encounter (HOSPITAL_BASED_OUTPATIENT_CLINIC_OR_DEPARTMENT_OTHER): Payer: Medicare Other | Admitting: Oncology

## 2014-09-06 VITALS — BP 146/60 | HR 62 | Temp 97.6°F | Resp 18 | Wt 155.2 lb

## 2014-09-06 DIAGNOSIS — C787 Secondary malignant neoplasm of liver and intrahepatic bile duct: Secondary | ICD-10-CM

## 2014-09-06 DIAGNOSIS — C252 Malignant neoplasm of tail of pancreas: Secondary | ICD-10-CM

## 2014-09-06 DIAGNOSIS — D509 Iron deficiency anemia, unspecified: Secondary | ICD-10-CM

## 2014-09-06 DIAGNOSIS — Z5111 Encounter for antineoplastic chemotherapy: Secondary | ICD-10-CM

## 2014-09-06 DIAGNOSIS — C259 Malignant neoplasm of pancreas, unspecified: Secondary | ICD-10-CM

## 2014-09-06 DIAGNOSIS — C801 Malignant (primary) neoplasm, unspecified: Secondary | ICD-10-CM | POA: Diagnosis not present

## 2014-09-06 LAB — COMPREHENSIVE METABOLIC PANEL
ALT: 16 U/L (ref 0–35)
ANION GAP: 8 (ref 5–15)
AST: 17 U/L (ref 0–37)
Albumin: 4 g/dL (ref 3.5–5.2)
Alkaline Phosphatase: 86 U/L (ref 39–117)
BILIRUBIN TOTAL: 0.7 mg/dL (ref 0.3–1.2)
BUN: 18 mg/dL (ref 6–23)
CHLORIDE: 105 mmol/L (ref 96–112)
CO2: 27 mmol/L (ref 19–32)
Calcium: 8.9 mg/dL (ref 8.4–10.5)
Creatinine, Ser: 0.99 mg/dL (ref 0.50–1.10)
GFR, EST AFRICAN AMERICAN: 59 mL/min — AB (ref >90)
GFR, EST NON AFRICAN AMERICAN: 51 mL/min — AB (ref >90)
GLUCOSE: 120 mg/dL — AB (ref 70–99)
POTASSIUM: 3.8 mmol/L (ref 3.5–5.1)
Sodium: 140 mmol/L (ref 135–145)
Total Protein: 7.2 g/dL (ref 6.0–8.3)

## 2014-09-06 LAB — CBC WITH DIFFERENTIAL/PLATELET
BASOS ABS: 0 10*3/uL (ref 0.0–0.1)
Basophils Relative: 0 % (ref 0–1)
EOS PCT: 0 % (ref 0–5)
Eosinophils Absolute: 0 10*3/uL (ref 0.0–0.7)
HCT: 32.6 % — ABNORMAL LOW (ref 36.0–46.0)
Hemoglobin: 10.6 g/dL — ABNORMAL LOW (ref 12.0–15.0)
LYMPHS ABS: 0.8 10*3/uL (ref 0.7–4.0)
Lymphocytes Relative: 10 % — ABNORMAL LOW (ref 12–46)
MCH: 27.5 pg (ref 26.0–34.0)
MCHC: 32.5 g/dL (ref 30.0–36.0)
MCV: 84.5 fL (ref 78.0–100.0)
Monocytes Absolute: 0.2 10*3/uL (ref 0.1–1.0)
Monocytes Relative: 3 % (ref 3–12)
NEUTROS ABS: 6.6 10*3/uL (ref 1.7–7.7)
NEUTROS PCT: 87 % — AB (ref 43–77)
PLATELETS: 281 10*3/uL (ref 150–400)
RBC: 3.86 MIL/uL — AB (ref 3.87–5.11)
RDW: 18.5 % — AB (ref 11.5–15.5)
WBC: 7.6 10*3/uL (ref 4.0–10.5)

## 2014-09-06 MED ORDER — SODIUM CHLORIDE 0.9 % IV SOLN
1000.0000 mg/m2 | Freq: Once | INTRAVENOUS | Status: AC
  Administered 2014-09-06: 1786 mg via INTRAVENOUS
  Filled 2014-09-06: qty 46.97

## 2014-09-06 MED ORDER — SODIUM CHLORIDE 0.9 % IV SOLN: 8.0000 mg | Freq: Once | INTRAVENOUS | Status: DC

## 2014-09-06 MED ORDER — HEPARIN SOD (PORK) LOCK FLUSH 100 UNIT/ML IV SOLN
500.0000 [IU] | Freq: Once | INTRAVENOUS | Status: AC | PRN
Start: 2014-09-06 — End: 2014-09-06
  Administered 2014-09-06: 500 [IU]
  Filled 2014-09-06: qty 5

## 2014-09-06 MED ORDER — SODIUM CHLORIDE 0.9 % IJ SOLN: 10.0000 mL | INTRAMUSCULAR | Status: DC | PRN

## 2014-09-06 MED ORDER — SODIUM CHLORIDE 0.9 % IV SOLN
Freq: Once | INTRAVENOUS | Status: AC
  Administered 2014-09-06: 11:00:00 via INTRAVENOUS

## 2014-09-06 MED ORDER — DEXAMETHASONE SODIUM PHOSPHATE 10 MG/ML IJ SOLN: 10.0000 mg | Freq: Once | INTRAMUSCULAR | Status: DC

## 2014-09-06 MED ORDER — PACLITAXEL PROTEIN-BOUND CHEMO INJECTION 100 MG
125.0000 mg/m2 | Freq: Once | INTRAVENOUS | Status: AC
  Administered 2014-09-06: 225 mg via INTRAVENOUS
  Filled 2014-09-06: qty 45

## 2014-09-06 MED ORDER — SODIUM CHLORIDE 0.9 % IV SOLN
Freq: Once | INTRAVENOUS | Status: AC
  Administered 2014-09-06: 8 mg via INTRAVENOUS
  Filled 2014-09-06: qty 4

## 2014-09-06 NOTE — Progress Notes (Signed)
Tolerated chemotherapy infusion well. D/C home ambulatory with family member.

## 2014-09-06 NOTE — Assessment & Plan Note (Addendum)
Stage IV Pancreatic Cancer, on systemic therapy with Abraxane and Gemzar in a day 1, 8 fashion every 28 days.  Presenting for day 1 of cycle 7 today.    Pre-chemotherapy labs as planned.  She meets treatment parameters and will therefore be treated today as planned.   Right thoracic side pain is nonspecific and resolved.  Will not work-up at this time.  Patient was late for her appointment today with Dr. Whitney Muse.  I had a slot available and therefore I worked her in on my schedule since she is here for day 1 of her next cycle of treatment.  Again, the patient and son are educated about the incurability of her cancer and the fact that chemotherapy is purely palliative in nature.  We will treat until we are unable to do so.  I provided the most recent imaging report for the patient and her son.  She can get her prednisone refilled as she has 1 remaining refill left on her Rx.  Return as scheduled in May 2016.

## 2014-09-06 NOTE — Patient Instructions (Signed)
[facility] Discharge Instructions for Patients Receiving Chemotherapy  Today you received the following chemotherapy agents abraxene, gemzar Please call the clinic if you have any questions or concerns Please follow up as scheduled.    To help prevent nausea and vomiting after your treatment, we encourage you to take your nausea medication    If you develop nausea and vomiting, or diarrhea that is not controlled by your medication, call the clinic.  The clinic phone number is [phone]. Office hours are Monday-Friday 8:30am-5:00pm.  BELOW ARE SYMPTOMS THAT SHOULD BE REPORTED IMMEDIATELY:  *FEVER GREATER THAN 101.0 F  *CHILLS WITH OR WITHOUT FEVER  NAUSEA AND VOMITING THAT IS NOT CONTROLLED WITH YOUR NAUSEA MEDICATION  *UNUSUAL SHORTNESS OF BREATH  *UNUSUAL BRUISING OR BLEEDING  TENDERNESS IN MOUTH AND THROAT WITH OR WITHOUT PRESENCE OF ULCERS  *URINARY PROBLEMS  *BOWEL PROBLEMS  UNUSUAL RASH Items with * indicate a potential emergency and should be followed up as soon as possible. If you have an emergency after office hours please contact your primary care physician or go to the nearest emergency department.  Please call the clinic during office hours if you have any questions or concerns.   You may also contact the Patient Navigator at [phone] should you have any questions or need assistance in obtaining follow up care. _____________________________________________________________________ Have you asked about our STAR program?    STAR stands for Survivorship Training and Rehabilitation, and this is a nationally recognized cancer care program that focuses on survivorship and rehabilitation.  Cancer and cancer treatments may cause problems, such as, pain, making you feel tired and keeping you from doing the things that you need or want to do. Cancer rehabilitation can help. Our goal is to reduce these troubling effects and help you have the best  quality of life possible.  You may receive a survey from a nurse that asks questions about your current state of health.  Based on the survey results, all eligible patients will be referred to the [facility] program for an evaluation so we can better serve you! A frequently asked questions sheet is available upon request.

## 2014-09-06 NOTE — Progress Notes (Signed)
[name] Tolerated chemotherapy well today. Discharged ambulatory

## 2014-09-06 NOTE — Progress Notes (Signed)
[name], MD [address] [place] [address]  Pancreatic cancer metastasized to liver  CURRENT THERAPY: Gemzar/Abraxane, presenting for Day 1 of cycle #7 today.  INTERVAL HISTORY: [name] 81 y.o. female returns for followup of Stage IV Pancreatic Cancer, on systemic therapy with Abraxane and Gemzar in a day 1, 8 fashion every 28 days.    Pancreatic cancer metastasized to liver   03/10/2014 Initial Diagnosis Pancreatic cancer metastasized to liver   03/22/2014 -  Chemotherapy Abraxane/Gemzar days 1, 8, every 28 days.  Day 15 was cancelled due to leukopenia and thrombocytopenia on day 15 cycle 1.   05/24/2014 Treatment Plan Change Day 8 of cycle 3 is held with ANC of 1.1   06/05/2014 Imaging CT C/A/P . Interval decrease in size of the pancreatic tail mass.Improved hepatic metastatic disease. No new lesions. No CT findings for metastatic disease involving the chest.   08/09/2014 Tumor Marker CA 19-9= 33 (WNL)    I personally reviewed and went over laboratory results with the patient.  The results are noted within this dictation.  I personally reviewed and went over radiographic studies with the patient.  The results are noted within this dictation.  She requests a copy of her report to have and review.  I printed the report for her and her son.  She notes a right lower thoracic discomfort laterally.  She reports it occurred spontaneously and resolved quickly spontaneously.  This occurred 1 or 2 days ago.  She denies any recurrence of this discomfort.  Palpation is negative.  This is unlikely malignancy-induced at this time.  She reports a cough which is being treated by her primary care provider.  She is taking Zyrtec and Flonase which has helped with her cough.  Her cough may have been the cause of the pain.  Additionally, she denies any urinary complaints.  Oncologically, she denies any complaints and ROS questioning is negative.   Past Medical  History  Diagnosis Date  . Hypertension 20 years   . Chronic diarrhea   . Anxiety   . Depression   . Kidney stone     hx/ crushed   . Hx of adenomatous colonic polyps     tubular adenomas, last found in 2008  . GERD (gastroesophageal reflux disease)   . Erosive esophagitis   . Schatzki's ring 08/26/10    Last dilated on EGD by Dr. Trevor I[facility], linear gastric erosions, BI hemigastrectomy  . Hyperplastic colon polyp 03/19/10    tcs by Dr. Gala Romney  . Pancreatic cancer 02/2014  . Pancreatic cancer metastasized to liver 03/10/2014    has Essential hypertension; ALLERGIC RHINITIS, SEASONAL; GERD (gastroesophageal reflux disease); Anxiety; Primary generalized (osteo)arthritis; Back pain with radiation; IGT (impaired glucose tolerance); Anemia, iron deficiency; Elevated LFTs; Change in stool habits; Abnormal liver ultrasound; Pancreatic cancer metastasized to liver; Cough; Loose stools; and Acute cystitis without hematuria on her problem list.     is allergic to iohexol; aciphex; amlodipine besylate-valsartan; esomeprazole magnesium; omeprazole; penicillins; and ciprofloxacin.  Ms. Quintanilla does not currently have medications on file.  Past Surgical History  Procedure Laterality Date  . Stomach ulcer  50 years ago     had some of her stomach removed   . Bunion removal      from both feet   . Left shoulder surgery  2009    DR HARRISON  . Colonoscopy  03/19/2010    DR Gala Romney,, normal TI, pancolonic diverticula,  random colon bx neg., hyperplastic polyps removed  . Esophagogastroduodenoscopy  11/22/2003    DR Gala Romney, erosive RE, Billroth I  . Esophagogastroduodenoscopy  08/26/10    Dr. Gala Romney- moderate severe ERE, Scahtzki ring s/p dilation, Billroth I, linear gastric erosions, bx-gastric xanthelasma  . Breast lumpectomy Left   . Orif ankle fracture Right 05/22/2013    Procedure: OPEN REDUCTION INTERNAL FIXATION (ORIF) RIGHT ANKLE FRACTURE;  Surgeon: Sanjuana Kava, MD;  Location: [facility];   Service: Orthopedics;  Laterality: Right;  . Cholecystectomy  1965     Denies any headaches, dizziness, double vision, fevers, chills, night sweats, nausea, vomiting, diarrhea, constipation, chest pain, heart palpitations, shortness of breath, blood in stool, black tarry stool, urinary pain, urinary burning, urinary frequency, hematuria.   PHYSICAL EXAMINATION  ECOG PERFORMANCE STATUS: 0 - Asymptomatic  There were no vitals filed for this visit.  GENERAL:alert, no distress, well nourished, well developed, comfortable, cooperative, smiling and accompanied by her son. SKIN: skin color, texture, turgor are normal, no rashes or significant lesions HEAD: Normocephalic, No masses, lesions, tenderness or abnormalities EYES: normal, PERRLA, EOMI, Conjunctiva are pink and non-injected EARS: External ears normal OROPHARYNX:lips, buccal mucosa, and tongue normal and mucous membranes are moist  NECK: supple, no adenopathy, thyroid normal size, non-tender, without nodularity, no stridor, non-tender, trachea midline LYMPH:  no palpable lymphadenopathy BREAST:not examined LUNGS: clear to auscultation and percussion HEART: regular rate & rhythm, no murmurs, no gallops, S1 normal and S2 normal ABDOMEN:abdomen soft, non-tender and normal bowel sounds BACK: Back symmetric, no curvature., No CVA tenderness EXTREMITIES:less then 2 second capillary refill, no joint deformities, effusion, or inflammation, no edema, no skin discoloration, no clubbing, no cyanosis  NEURO: alert & oriented x 3 with fluent speech, no focal motor/sensory deficits, gait normal   LABORATORY DATA: CBC    Component Value Date/Time   WBC 7.6 09/06/2014 0930   RBC 3.86* 09/06/2014 0930   HGB 10.6* 09/06/2014 0930   HCT 32.6* 09/06/2014 0930   PLT 281 09/06/2014 0930   MCV 84.5 09/06/2014 0930   MCH 27.5 09/06/2014 0930   MCHC 32.5 09/06/2014 0930   RDW 18.5* 09/06/2014 0930   LYMPHSABS 0.8 09/06/2014 0930   MONOABS 0.2  09/06/2014 0930   EOSABS 0.0 09/06/2014 0930   BASOSABS 0.0 09/06/2014 0930      Chemistry      Component Value Date/Time   NA 140 09/06/2014 0930   K 3.8 09/06/2014 0930   CL 105 09/06/2014 0930   CO2 27 09/06/2014 0930   BUN 18 09/06/2014 0930   CREATININE 0.99 09/06/2014 0930   CREATININE 0.97 02/14/2014 0758      Component Value Date/Time   CALCIUM 8.9 09/06/2014 0930   ALKPHOS 86 09/06/2014 0930   AST 17 09/06/2014 0930   ALT 16 09/06/2014 0930   BILITOT 0.7 09/06/2014 0930        ASSESSMENT AND PLAN:  Pancreatic cancer metastasized to liver Stage IV Pancreatic Cancer, on systemic therapy with Abraxane and Gemzar in a day 1, 8 fashion every 28 days.  Presenting for day 1 of cycle 7 today.    Pre-chemotherapy labs as planned.  She meets treatment parameters and will therefore be treated today as planned.   Right thoracic side pain is nonspecific and resolved.  Will not work-up at this time.  Patient was late for her appointment today with Dr. Whitney Muse.  I had a slot available and therefore I worked her in on my schedule since she is here  for day 1 of her next cycle of treatment.  Again, the patient and son are educated about the incurability of her cancer and the fact that chemotherapy is purely palliative in nature.  We will treat until we are unable to do so.  I provided the most recent imaging report for the patient and her son.  She can get her prednisone refilled as she has 1 remaining refill left on her Rx.  Return as scheduled in May 2016.     THERAPY PLAN:  Continue as planned.  We will monitor for progression of disease.  Treatment is palliative only.  All questions were answered. The patient knows to call the clinic with any problems, questions or concerns. We can certainly see the patient much sooner if necessary.  Patient and plan discussed with Dr. Ancil Linsey and she is in agreement with the aforementioned.   This note is electronically  signed by: Robynn Pane 09/06/2014 12:05 PM

## 2014-09-07 LAB — CANCER ANTIGEN 19-9: CA 19 9: 39 U/mL — AB (ref 0–35)

## 2014-09-11 ENCOUNTER — Other Ambulatory Visit: Payer: Self-pay

## 2014-09-11 ENCOUNTER — Telehealth: Payer: Self-pay | Admitting: Family Medicine

## 2014-09-11 MED ORDER — PANTOPRAZOLE SODIUM 20 MG PO TBEC: 20.0000 mg | DELAYED_RELEASE_TABLET | Freq: Every day | ORAL | Status: DC

## 2014-09-11 NOTE — Telephone Encounter (Signed)
Noted and meds sent to both pharmacies as requested

## 2014-09-13 ENCOUNTER — Encounter (HOSPITAL_COMMUNITY): Payer: Self-pay

## 2014-09-13 ENCOUNTER — Encounter (HOSPITAL_COMMUNITY): Payer: Medicare Other | Attending: Hematology and Oncology

## 2014-09-13 VITALS — BP 159/69 | HR 62 | Temp 97.7°F | Resp 18 | Wt 155.6 lb

## 2014-09-13 DIAGNOSIS — C252 Malignant neoplasm of tail of pancreas: Secondary | ICD-10-CM | POA: Diagnosis not present

## 2014-09-13 DIAGNOSIS — C787 Secondary malignant neoplasm of liver and intrahepatic bile duct: Secondary | ICD-10-CM | POA: Insufficient documentation

## 2014-09-13 DIAGNOSIS — Z5111 Encounter for antineoplastic chemotherapy: Secondary | ICD-10-CM

## 2014-09-13 DIAGNOSIS — C259 Malignant neoplasm of pancreas, unspecified: Secondary | ICD-10-CM | POA: Insufficient documentation

## 2014-09-13 DIAGNOSIS — D509 Iron deficiency anemia, unspecified: Secondary | ICD-10-CM

## 2014-09-13 DIAGNOSIS — C801 Malignant (primary) neoplasm, unspecified: Secondary | ICD-10-CM | POA: Diagnosis not present

## 2014-09-13 LAB — COMPREHENSIVE METABOLIC PANEL
ALK PHOS: 75 U/L (ref 38–126)
ALT: 14 U/L (ref 14–54)
AST: 18 U/L (ref 15–41)
Albumin: 4 g/dL (ref 3.5–5.0)
Anion gap: 9 (ref 5–15)
BUN: 15 mg/dL (ref 6–20)
CHLORIDE: 104 mmol/L (ref 101–111)
CO2: 28 mmol/L (ref 22–32)
Calcium: 8.9 mg/dL (ref 8.9–10.3)
Creatinine, Ser: 0.85 mg/dL (ref 0.44–1.00)
GFR calc Af Amer: >60 mL/min (ref >60)
GFR calc non Af Amer: >60 mL/min (ref >60)
Glucose, Bld: 129 mg/dL — ABNORMAL HIGH (ref 70–99)
Potassium: 4.5 mmol/L (ref 3.5–5.1)
Sodium: 141 mmol/L (ref 135–145)
Total Bilirubin: 0.7 mg/dL (ref 0.3–1.2)
Total Protein: 7.1 g/dL (ref 6.5–8.1)

## 2014-09-13 LAB — CBC WITH DIFFERENTIAL/PLATELET
BASOS PCT: 0 % (ref 0–1)
Basophils Absolute: 0 10*3/uL (ref 0.0–0.1)
EOS ABS: 0 10*3/uL (ref 0.0–0.7)
EOS PCT: 0 % (ref 0–5)
HCT: 31 % — ABNORMAL LOW (ref 36.0–46.0)
Hemoglobin: 10.1 g/dL — ABNORMAL LOW (ref 12.0–15.0)
Lymphocytes Relative: 15 % (ref 12–46)
Lymphs Abs: 0.6 10*3/uL — ABNORMAL LOW (ref 0.7–4.0)
MCH: 27.6 pg (ref 26.0–34.0)
MCHC: 32.6 g/dL (ref 30.0–36.0)
MCV: 84.7 fL (ref 78.0–100.0)
MONO ABS: 0.1 10*3/uL (ref 0.1–1.0)
Monocytes Relative: 3 % (ref 3–12)
Neutro Abs: 3.5 10*3/uL (ref 1.7–7.7)
Neutrophils Relative %: 82 % — ABNORMAL HIGH (ref 43–77)
PLATELETS: 129 10*3/uL — AB (ref 150–400)
RBC: 3.66 MIL/uL — AB (ref 3.87–5.11)
RDW: 17.9 % — ABNORMAL HIGH (ref 11.5–15.5)
WBC: 4.2 10*3/uL (ref 4.0–10.5)

## 2014-09-13 MED ORDER — HEPARIN SOD (PORK) LOCK FLUSH 100 UNIT/ML IV SOLN
500.0000 [IU] | Freq: Once | INTRAVENOUS | Status: AC | PRN
  Administered 2014-09-13: 500 [IU]

## 2014-09-13 MED ORDER — PACLITAXEL PROTEIN-BOUND CHEMO INJECTION 100 MG
125.0000 mg/m2 | Freq: Once | INTRAVENOUS | Status: AC
  Administered 2014-09-13: 225 mg via INTRAVENOUS
  Filled 2014-09-13: qty 45

## 2014-09-13 MED ORDER — SODIUM CHLORIDE 0.9 % IV SOLN: 8.0000 mg | Freq: Once | INTRAVENOUS | Status: DC

## 2014-09-13 MED ORDER — SODIUM CHLORIDE 0.9 % IV SOLN
Freq: Once | INTRAVENOUS | Status: AC
  Administered 2014-09-13: 10:00:00 via INTRAVENOUS

## 2014-09-13 MED ORDER — SODIUM CHLORIDE 0.9 % IJ SOLN
10.0000 mL | INTRAMUSCULAR | Status: DC | PRN
  Administered 2014-09-13: 10 mL
  Filled 2014-09-13: qty 10

## 2014-09-13 MED ORDER — SODIUM CHLORIDE 0.9 % IV SOLN
Freq: Once | INTRAVENOUS | Status: AC
  Administered 2014-09-13: 8 mg via INTRAVENOUS
  Filled 2014-09-13: qty 4

## 2014-09-13 MED ORDER — SODIUM CHLORIDE 0.9 % IV SOLN
1000.0000 mg/m2 | Freq: Once | INTRAVENOUS | Status: AC
  Administered 2014-09-13: 1786 mg via INTRAVENOUS
  Filled 2014-09-13: qty 46.97

## 2014-09-13 MED ORDER — DEXAMETHASONE SODIUM PHOSPHATE 10 MG/ML IJ SOLN: 10.0000 mg | Freq: Once | INTRAMUSCULAR | Status: DC

## 2014-09-13 NOTE — Patient Instructions (Signed)
[facility] Discharge Instructions for Patients Receiving Chemotherapy  Today you received the following chemotherapy agents:  Abraxane and Gemzar. Return as scheduled on 10/04/14 for office visit with Dr. Whitney Muse and chemotherapy.  If you develop nausea and vomiting, or diarrhea that is not controlled by your medication, call the clinic.  The clinic phone number is [phone]. Office hours are Monday-Friday 8:30am-5:00pm.  BELOW ARE SYMPTOMS THAT SHOULD BE REPORTED IMMEDIATELY:  *FEVER GREATER THAN 101.0 F  *CHILLS WITH OR WITHOUT FEVER  NAUSEA AND VOMITING THAT IS NOT CONTROLLED WITH YOUR NAUSEA MEDICATION  *UNUSUAL SHORTNESS OF BREATH  *UNUSUAL BRUISING OR BLEEDING  TENDERNESS IN MOUTH AND THROAT WITH OR WITHOUT PRESENCE OF ULCERS  *URINARY PROBLEMS  *BOWEL PROBLEMS  UNUSUAL RASH Items with * indicate a potential emergency and should be followed up as soon as possible. If you have an emergency after office hours please contact your primary care physician or go to the nearest emergency department.  Please call the clinic during office hours if you have any questions or concerns.   You may also contact the Patient Navigator at [phone] should you have any questions or need assistance in obtaining follow up care. _____________________________________________________________________ Have you asked about our STAR program?    STAR stands for Survivorship Training and Rehabilitation, and this is a nationally recognized cancer care program that focuses on survivorship and rehabilitation.  Cancer and cancer treatments may cause problems, such as, pain, making you feel tired and keeping you from doing the things that you need or want to do. Cancer rehabilitation can help. Our goal is to reduce these troubling effects and help you have the best quality of life possible.  You may receive a survey from a nurse that asks questions about your current state of health.   Based on the survey results, all eligible patients will be referred to the [facility] program for an evaluation so we can better serve you! A frequently asked questions sheet is available upon request.

## 2014-09-24 NOTE — Assessment & Plan Note (Signed)
Uncontrolled with chronic dry cough, short course of prednisone twice daily recommended

## 2014-09-24 NOTE — Progress Notes (Signed)
   Subjective:    Patient ID: [name], female    DOB: 12/19/1928, 81 y.o.   MRN: [id number]  HPI The PT is here for follow up and re-evaluation of chronic medical conditions, medication management and review of any available recent lab and radiology data.  Preventive health is updated, specifically  Cancer screening and Immunization.   Questions or concerns regarding consultations or procedures which the PT has had in the interim are  Addressed.Wishes to review with me the most updated report from oncology and we do this together during the  visit The PT denies any adverse reactions to current medications since the last visit.  Still c/o dry cough, persistent and present throughout the day, no fever, chills,sinus pressure or drainage Appetite not very good,and she is on prednisone from oncology to help with this    Review of Systems See HPI Denies recent fever or chills. Denies sinus pressure, does have some  nasal congestion,  Denies ear pain or sore throat. Denies chest congestion,  or wheezing. Denies chest pains, palpitations and leg swelling Denies abdominal pain, nausea, vomiting,diarrhea or constipation.   Denies dysuria, frequency, hesitancy or incontinence. Denies joint pain, swelling and limitation in mobility. Denies headaches, seizures, numbness, or tingling. Denies depression, uncontrolled anxiety or insomnia. Denies skin break down or rash.        Objective:   Physical Exam  BP 140/80 mmHg  Pulse 63  Resp 16  Ht 5\' 4"  (1.626 m)  Wt 158 lb (71.668 kg)  BMI 27.11 kg/m2  SpO2 96% Patient alert and oriented and in no cardiopulmonary distress.  HEENT: No facial asymmetry, EOMI,   oropharynx pink and moist.  Neck decreased though adequate ROM no JVD, no mass.  Chest: Clear to auscultation bilaterally.No crackles or wheezes, good air entry throughout  CVS: S1, S2 no murmurs, no S3.Regular rate.  ABD: Soft non tender.   Ext: No edema  MS: Adequate  though reduced  ROM spine, shoulders, hips and knees.  Skin: Intact, no ulcerations or rash noted.  Psych: Good eye contact, normal affect. Memory intact mildly  anxious or depressed appearing.  CNS: CN 2-12 intact, power,  normal throughout.no focal deficits noted.       Assessment & Plan:  Allergic cough Cough due to uncontrolled allergies, short sharp course of double dose of prednisone and daily allergy medication should address this, no infection present    Essential hypertension Controlled, no change in medication    ALLERGIC RHINITIS, SEASONAL Uncontrolled with chronic dry cough, short course of prednisone twice daily recommended   Anxiety Controlled, no change in medication    Pancreatic cancer metastasized to liver Tolerating treatment well, and response is encouraging at this time   GERD (gastroesophageal reflux disease) Controlled, no change in medication

## 2014-09-24 NOTE — Assessment & Plan Note (Signed)
Controlled, no change in medication  

## 2014-09-24 NOTE — Assessment & Plan Note (Signed)
Cough due to uncontrolled allergies, short sharp course of double dose of prednisone and daily allergy medication should address this, no infection present

## 2014-09-24 NOTE — Assessment & Plan Note (Signed)
Tolerating treatment well, and response is encouraging at this time

## 2014-10-04 ENCOUNTER — Encounter (HOSPITAL_COMMUNITY): Payer: Self-pay | Admitting: Hematology & Oncology

## 2014-10-04 ENCOUNTER — Encounter (HOSPITAL_BASED_OUTPATIENT_CLINIC_OR_DEPARTMENT_OTHER): Payer: Medicare Other

## 2014-10-04 ENCOUNTER — Encounter (HOSPITAL_BASED_OUTPATIENT_CLINIC_OR_DEPARTMENT_OTHER): Payer: Medicare Other | Admitting: Hematology & Oncology

## 2014-10-04 ENCOUNTER — Inpatient Hospital Stay (HOSPITAL_COMMUNITY): Payer: Medicare Other

## 2014-10-04 VITALS — BP 153/61 | HR 60 | Temp 98.1°F | Resp 18 | Wt 156.2 lb

## 2014-10-04 VITALS — BP 173/53 | HR 56 | Temp 97.6°F | Resp 18

## 2014-10-04 DIAGNOSIS — C259 Malignant neoplasm of pancreas, unspecified: Secondary | ICD-10-CM | POA: Diagnosis not present

## 2014-10-04 DIAGNOSIS — Z5111 Encounter for antineoplastic chemotherapy: Secondary | ICD-10-CM

## 2014-10-04 DIAGNOSIS — C252 Malignant neoplasm of tail of pancreas: Secondary | ICD-10-CM

## 2014-10-04 DIAGNOSIS — R300 Dysuria: Secondary | ICD-10-CM

## 2014-10-04 DIAGNOSIS — C787 Secondary malignant neoplasm of liver and intrahepatic bile duct: Secondary | ICD-10-CM | POA: Diagnosis not present

## 2014-10-04 DIAGNOSIS — R530 Neoplastic (malignant) related fatigue: Secondary | ICD-10-CM

## 2014-10-04 DIAGNOSIS — C801 Malignant (primary) neoplasm, unspecified: Secondary | ICD-10-CM | POA: Diagnosis not present

## 2014-10-04 LAB — URINE MICROSCOPIC-ADD ON

## 2014-10-04 LAB — CBC WITH DIFFERENTIAL/PLATELET
Basophils Absolute: 0 10*3/uL (ref 0.0–0.1)
Basophils Relative: 1 % (ref 0–1)
EOS ABS: 0.1 10*3/uL (ref 0.0–0.7)
EOS PCT: 1 % (ref 0–5)
HCT: 31.1 % — ABNORMAL LOW (ref 36.0–46.0)
HEMOGLOBIN: 9.9 g/dL — AB (ref 12.0–15.0)
LYMPHS PCT: 22 % (ref 12–46)
Lymphs Abs: 1.5 10*3/uL (ref 0.7–4.0)
MCH: 27.3 pg (ref 26.0–34.0)
MCHC: 31.8 g/dL (ref 30.0–36.0)
MCV: 85.7 fL (ref 78.0–100.0)
MONO ABS: 0.5 10*3/uL (ref 0.1–1.0)
MONOS PCT: 7 % (ref 3–12)
Neutro Abs: 4.8 10*3/uL (ref 1.7–7.7)
Neutrophils Relative %: 69 % (ref 43–77)
Platelets: 304 10*3/uL (ref 150–400)
RBC: 3.63 MIL/uL — ABNORMAL LOW (ref 3.87–5.11)
RDW: 18.8 % — AB (ref 11.5–15.5)
WBC: 6.9 10*3/uL (ref 4.0–10.5)

## 2014-10-04 LAB — COMPREHENSIVE METABOLIC PANEL
ALK PHOS: 93 U/L (ref 38–126)
ALT: 15 U/L (ref 14–54)
AST: 17 U/L (ref 15–41)
Albumin: 3.9 g/dL (ref 3.5–5.0)
Anion gap: 6 (ref 5–15)
BILIRUBIN TOTAL: 0.7 mg/dL (ref 0.3–1.2)
BUN: 19 mg/dL (ref 6–20)
CO2: 26 mmol/L (ref 22–32)
CREATININE: 0.96 mg/dL (ref 0.44–1.00)
Calcium: 8.9 mg/dL (ref 8.9–10.3)
Chloride: 107 mmol/L (ref 101–111)
GFR calc non Af Amer: 53 mL/min — ABNORMAL LOW (ref >60)
Glucose, Bld: 112 mg/dL — ABNORMAL HIGH (ref 65–99)
POTASSIUM: 4 mmol/L (ref 3.5–5.1)
Sodium: 139 mmol/L (ref 135–145)
Total Protein: 6.9 g/dL (ref 6.5–8.1)

## 2014-10-04 LAB — URINALYSIS, ROUTINE W REFLEX MICROSCOPIC
Bilirubin Urine: NEGATIVE
Glucose, UA: NEGATIVE mg/dL
Ketones, ur: NEGATIVE mg/dL
Leukocytes, UA: NEGATIVE
Nitrite: NEGATIVE
PROTEIN: NEGATIVE mg/dL
SPECIFIC GRAVITY, URINE: 1.025 (ref 1.005–1.030)
UROBILINOGEN UA: 0.2 mg/dL (ref 0.0–1.0)
pH: 5.5 (ref 5.0–8.0)

## 2014-10-04 MED ORDER — DEXAMETHASONE SODIUM PHOSPHATE 10 MG/ML IJ SOLN: 10.0000 mg | Freq: Once | INTRAMUSCULAR | Status: DC

## 2014-10-04 MED ORDER — SODIUM CHLORIDE 0.9 % IV SOLN
Freq: Once | INTRAVENOUS | Status: AC
  Administered 2014-10-04: 09:00:00 via INTRAVENOUS

## 2014-10-04 MED ORDER — SODIUM CHLORIDE 0.9 % IJ SOLN: 10.0000 mL | INTRAMUSCULAR | Status: DC | PRN

## 2014-10-04 MED ORDER — PACLITAXEL PROTEIN-BOUND CHEMO INJECTION 100 MG
125.0000 mg/m2 | Freq: Once | INTRAVENOUS | Status: AC
  Administered 2014-10-04: 225 mg via INTRAVENOUS
  Filled 2014-10-04: qty 45

## 2014-10-04 MED ORDER — SODIUM CHLORIDE 0.9 % IV SOLN: 8.0000 mg | Freq: Once | INTRAVENOUS | Status: DC

## 2014-10-04 MED ORDER — HEPARIN SOD (PORK) LOCK FLUSH 100 UNIT/ML IV SOLN
500.0000 [IU] | Freq: Once | INTRAVENOUS | Status: AC | PRN
  Administered 2014-10-04: 500 [IU]
  Filled 2014-10-04: qty 5

## 2014-10-04 MED ORDER — PREDNISONE 5 MG PO TABS: 5.0000 mg | ORAL_TABLET | Freq: Every day | ORAL | Status: DC

## 2014-10-04 MED ORDER — SODIUM CHLORIDE 0.9 % IV SOLN
1000.0000 mg/m2 | Freq: Once | INTRAVENOUS | Status: AC
  Administered 2014-10-04: 1786 mg via INTRAVENOUS
  Filled 2014-10-04: qty 46.97

## 2014-10-04 MED ORDER — ONDANSETRON HCL 40 MG/20ML IJ SOLN
Freq: Once | INTRAMUSCULAR | Status: AC
  Administered 2014-10-04: 8 mg via INTRAVENOUS
  Filled 2014-10-04: qty 4

## 2014-10-04 NOTE — Progress Notes (Signed)
[name] Nakayama, MD [address] [place] [address]   DIAGNOSIS: Pancreatic cancer metastasized to liver   Staging form: Pancreas, AJCC 7th Edition     Clinical: Stage IV (T3, N1, M1) - Signed by Baird Cancer, PA-C on 03/16/2014     Pathologic: No stage assigned - Unsigned  CONTRAST MEDIA ALLERGY  SUMMARY OF ONCOLOGIC HISTORY:   Pancreatic cancer metastasized to liver   03/10/2014 Initial Diagnosis Pancreatic cancer metastasized to liver   03/22/2014 -  Chemotherapy Abraxane/Gemzar days 1, 8, every 28 days.  Day 15 was cancelled due to leukopenia and thrombocytopenia on day 15 cycle 1.   05/24/2014 Treatment Plan Change Day 8 of cycle 3 is held with ANC of 1.1   06/05/2014 Imaging CT C/A/P . Interval decrease in size of the pancreatic tail mass.Improved hepatic metastatic disease. No new lesions. No CT findings for metastatic disease involving the chest.   08/09/2014 Tumor Marker CA 19-9= 33 (WNL)    CURRENT THERAPY: Gemzar/Abraxane  INTERVAL HISTORY: [name] 81 y.o. female returns for follow-up of her pancreatic cancer. She is doing fairly well. He notes that she attends church weekly. She gets stressed daily. She does all of her housework. She says she occasionally cooks. She continues to get out some with family and friends. She is complaining of some urinary frequency with rare dysuria. She takes prednisone 5 mg a day because she states it really helps her energy. She feels some numbness and tingling in her toes at night only. It does not limit her or prevent her from sleeping. She has no other major complaints today. Weight is overall stable.   MEDICAL HISTORY: Past Medical History  Diagnosis Date  . Hypertension 20 years   . Chronic diarrhea   . Anxiety   . Depression   . Kidney stone     hx/ crushed   . Hx of adenomatous colonic polyps     tubular adenomas, last found in 2008  . GERD (gastroesophageal reflux disease)   . Erosive  esophagitis   . Schatzki's ring 08/26/10    Last dilated on EGD by Dr. Trevor Iha [facility], linear gastric erosions, BI hemigastrectomy  . Hyperplastic colon polyp 03/19/10    tcs by Dr. Gala Romney  . Pancreatic cancer 02/2014  . Pancreatic cancer metastasized to liver 03/10/2014    has Essential hypertension; ALLERGIC RHINITIS, SEASONAL; GERD (gastroesophageal reflux disease); Anxiety; Primary generalized (osteo)arthritis; Back pain with radiation; IGT (impaired glucose tolerance); Anemia, iron deficiency; Elevated LFTs; Change in stool habits; Abnormal liver ultrasound; Pancreatic cancer metastasized to liver; Allergic cough; and Loose stools on her problem list.     is allergic to iohexol; aciphex; amlodipine besylate-valsartan; esomeprazole magnesium; omeprazole; penicillins; and ciprofloxacin.  Ms. Tierney does not currently have medications on file.  SURGICAL HISTORY: Past Surgical History  Procedure Laterality Date  . Stomach ulcer  50 years ago     had some of her stomach removed   . Bunion removal      from both feet   . Left shoulder surgery  2009    DR HARRISON  . Colonoscopy  03/19/2010    DR Gala Romney,, normal TI, pancolonic diverticula, random colon bx neg., hyperplastic polyps removed  . Esophagogastroduodenoscopy  11/22/2003    DR Gala Romney, erosive RE, Billroth I  . Esophagogastroduodenoscopy  08/26/10    Dr. Gala Romney- moderate severe ERE, Scahtzki ring s/p dilation, Billroth I, linear gastric erosions, bx-gastric xanthelasma  . Breast lumpectomy Left   .  Orif ankle fracture Right 05/22/2013    Procedure: OPEN REDUCTION INTERNAL FIXATION (ORIF) RIGHT ANKLE FRACTURE;  Surgeon: Sanjuana Kava, MD;  Location: [facility];  Service: Orthopedics;  Laterality: Right;  . Cholecystectomy  1965     SOCIAL HISTORY: History   Social History  . Marital Status: Widowed    Spouse Name: N/A  . Number of Children: 2  . Years of Education: N/A   Occupational History  . retired from Teacher, adult education    Social  History Main Topics  . Smoking status: Former Smoker    Types: Cigarettes    Quit date: 11/09/2011  . Smokeless tobacco: Never Used     Comment: quit a few weeks ago   . Alcohol Use: No  . Drug Use: No  . Sexual Activity: No   Other Topics Concern  . Not on file   Social History Narrative    FAMILY HISTORY: Family History  Problem Relation Age of Onset  . Hypertension Mother   . Kidney failure Brother     X1 ON DIALYSIS  . Diabetes Sister   . Cancer Sister     X2  . Hypertension Father   . Liver disease Neg Hx   . Colon cancer Neg Hx     Review of Systems  Constitutional: Positive for malaise/fatigue.  HENT: Negative.   Eyes: Positive for blurred vision. Negative for double vision, photophobia, pain, discharge and redness.  Respiratory: Negative.   Cardiovascular: Negative.   Gastrointestinal: Negative.   Genitourinary: Positive for dysuria and frequency.  Musculoskeletal: Positive for joint pain.  Skin: Negative.   Neurological: Positive for weakness. Negative for dizziness, tingling, tremors, sensory change, speech change, focal weakness, seizures and loss of consciousness.  Endo/Heme/Allergies: Negative.   Psychiatric/Behavioral: Negative.     PHYSICAL EXAMINATION  ECOG PERFORMANCE STATUS: 0 - Asymptomatic  Filed Vitals:   10/04/14 0833  BP: 153/61  Pulse: 60  Temp: 98.1 F (36.7 C)  Resp: 18    Physical Exam  Constitutional: She is oriented to person, place, and time and well-developed, well-nourished, and in no distress.  Wearing hair prosthesis, well groomed  HENT:  Head: Normocephalic and atraumatic.  Nose: Nose normal.  Mouth/Throat: Oropharynx is clear and moist. No oropharyngeal exudate.  Eyes: Conjunctivae and EOM are normal. Pupils are equal, round, and reactive to light. Right eye exhibits no discharge. Left eye exhibits no discharge. No scleral icterus.  Neck: Normal range of motion. Neck supple. No tracheal deviation present. No  thyromegaly present.  Cardiovascular: Normal rate, regular rhythm and normal heart sounds.  Exam reveals no gallop and no friction rub.   No murmur heard. Pulmonary/Chest: Effort normal and breath sounds normal. She has no wheezes. She has no rales.  Abdominal: Soft. Bowel sounds are normal. She exhibits no distension and no mass. There is no tenderness. There is no rebound and no guarding.  Musculoskeletal: Normal range of motion. She exhibits no edema.  Lymphadenopathy:    She has no cervical adenopathy.  Neurological: She is alert and oriented to person, place, and time. She has normal reflexes. No cranial nerve deficit. Gait normal. Coordination normal.  Skin: Skin is warm and dry. No rash noted.  Psychiatric: Mood, memory, affect and judgment normal.  Nursing note and vitals reviewed.   LABORATORY DATA:  CBC    Component Value Date/Time   WBC 6.9 10/04/2014 0840   RBC 3.63* 10/04/2014 0840   HGB 9.9* 10/04/2014 0840   HCT 31.1* 10/04/2014 0840  PLT 304 10/04/2014 0840   MCV 85.7 10/04/2014 0840   MCH 27.3 10/04/2014 0840   MCHC 31.8 10/04/2014 0840   RDW 18.8* 10/04/2014 0840   LYMPHSABS 1.5 10/04/2014 0840   MONOABS 0.5 10/04/2014 0840   EOSABS 0.1 10/04/2014 0840   BASOSABS 0.0 10/04/2014 0840   CMP     Component Value Date/Time   NA 141 09/13/2014 1020   K 4.5 09/13/2014 1020   CL 104 09/13/2014 1020   CO2 28 09/13/2014 1020   GLUCOSE 129* 09/13/2014 1020   BUN 15 09/13/2014 1020   CREATININE 0.85 09/13/2014 1020   CREATININE 0.97 02/14/2014 0758   CALCIUM 8.9 09/13/2014 1020   PROT 7.1 09/13/2014 1020   ALBUMIN 4.0 09/13/2014 1020   AST 18 09/13/2014 1020   ALT 14 09/13/2014 1020   ALKPHOS 75 09/13/2014 1020   BILITOT 0.7 09/13/2014 1020   GFRNONAA >60 09/13/2014 1020   GFRNONAA 52* 11/04/2013 0820   GFRAA >60 09/13/2014 1020   GFRAA 60 11/04/2013 0820     ASSESSMENT and THERAPY PLAN:   STAGE IV Pancreatic Cancer Dysuria  81 year old female on  Gemzar/Abraxane for stage IV pancreatic cancer. She is doing and has done remarkably well. She wishes to continue with therapy. Last imaging in January was improved.  We will schedule her for re-imaging and I will see her afterward to review. Her performance status is a 1.  In regards to her dysuria, we will obtain a U/A today. She will be called in an antibiotic if needed.  I will refill her prednisone.  I do not feel this is detrimental to her as she takes a low dose daily.  All questions were answered. The patient knows to call the clinic with any problems, questions or concerns. We can certainly see the patient much sooner if necessary.  This note was signed electronically. Molli Hazard MD 10/04/2014

## 2014-10-04 NOTE — Patient Instructions (Signed)
[place] at [facility] Discharge Instructions  RECOMMENDATIONS MADE BY THE CONSULTANT AND ANY TEST RESULTS WILL BE SENT TO YOUR REFERRING PHYSICIAN.  Continue chemotherapy treatment plan as scheduled, Day 1 and Day 8 every 28 days. CT scan as scheduled. Return as scheduled for all appointments. Report any issues/concerns to clinic as needed.  Thank you for choosing [place] at [facility] to provide your oncology and hematology care.  To afford each patient quality time with our provider, please arrive at least 15 minutes before your scheduled appointment time.    You need to re-schedule your appointment should you arrive 10 or more minutes late.  We strive to give you quality time with our providers, and arriving late affects you and other patients whose appointments are after yours.  Also, if you no show three or more times for appointments you may be dismissed from the clinic at the providers discretion.     Again, thank you for choosing [facility].  Our hope is that these requests will decrease the amount of time that you wait before being seen by our physicians.       _____________________________________________________________  Should you have questions after your visit to [facility], please contact our office at [phone] between the hours of 8:30 a.m. and 4:30 p.m.  Voicemails left after 4:30 p.m. will not be returned until the following business day.  For prescription refill requests, have your pharmacy contact our office.

## 2014-10-04 NOTE — Patient Instructions (Signed)
[facility] Discharge Instructions for Patients Receiving Chemotherapy  Today you received the following chemotherapy agents gemzar and abraxene Please follow up as scheduled Call the clinic if you have any questions or concerns  To help prevent nausea and vomiting after your treatment, we encourage you to take your nausea medication  If you develop nausea and vomiting, or diarrhea that is not controlled by your medication, call the clinic.  The clinic phone number is [phone]. Office hours are Monday-Friday 8:30am-5:00pm.  BELOW ARE SYMPTOMS THAT SHOULD BE REPORTED IMMEDIATELY:  *FEVER GREATER THAN 101.0 F  *CHILLS WITH OR WITHOUT FEVER  NAUSEA AND VOMITING THAT IS NOT CONTROLLED WITH YOUR NAUSEA MEDICATION  *UNUSUAL SHORTNESS OF BREATH  *UNUSUAL BRUISING OR BLEEDING  TENDERNESS IN MOUTH AND THROAT WITH OR WITHOUT PRESENCE OF ULCERS  *URINARY PROBLEMS  *BOWEL PROBLEMS  UNUSUAL RASH Items with * indicate a potential emergency and should be followed up as soon as possible. If you have an emergency after office hours please contact your primary care physician or go to the nearest emergency department.  Please call the clinic during office hours if you have any questions or concerns.   You may also contact the Patient Navigator at [phone] should you have any questions or need assistance in obtaining follow up care. _____________________________________________________________________ Have you asked about our STAR program?    STAR stands for Survivorship Training and Rehabilitation, and this is a nationally recognized cancer care program that focuses on survivorship and rehabilitation.  Cancer and cancer treatments may cause problems, such as, pain, making you feel tired and keeping you from doing the things that you need or want to do. Cancer rehabilitation can help. Our goal is to reduce these troubling effects and help you have the best quality of life  possible.  You may receive a survey from a nurse that asks questions about your current state of health.  Based on the survey results, all eligible patients will be referred to the [facility] program for an evaluation so we can better serve you! A frequently asked questions sheet is available upon request.

## 2014-10-04 NOTE — Progress Notes (Signed)
[name] Tolerated chemotherapy well today Discharged ambulatory

## 2014-10-05 LAB — CANCER ANTIGEN 19-9: CA 19-9: 35 U/mL (ref 0–35)

## 2014-10-11 ENCOUNTER — Encounter (HOSPITAL_COMMUNITY): Payer: Self-pay

## 2014-10-11 ENCOUNTER — Encounter (HOSPITAL_COMMUNITY): Payer: Medicare Other | Attending: Hematology and Oncology

## 2014-10-11 VITALS — BP 169/69 | HR 61 | Temp 98.0°F | Resp 18 | Wt 155.4 lb

## 2014-10-11 DIAGNOSIS — C801 Malignant (primary) neoplasm, unspecified: Secondary | ICD-10-CM | POA: Diagnosis not present

## 2014-10-11 DIAGNOSIS — Z5111 Encounter for antineoplastic chemotherapy: Secondary | ICD-10-CM

## 2014-10-11 DIAGNOSIS — C252 Malignant neoplasm of tail of pancreas: Secondary | ICD-10-CM | POA: Diagnosis not present

## 2014-10-11 DIAGNOSIS — C259 Malignant neoplasm of pancreas, unspecified: Secondary | ICD-10-CM | POA: Diagnosis not present

## 2014-10-11 DIAGNOSIS — C787 Secondary malignant neoplasm of liver and intrahepatic bile duct: Secondary | ICD-10-CM | POA: Diagnosis not present

## 2014-10-11 DIAGNOSIS — D509 Iron deficiency anemia, unspecified: Secondary | ICD-10-CM

## 2014-10-11 LAB — CBC WITH DIFFERENTIAL/PLATELET
Basophils Absolute: 0 10*3/uL (ref 0.0–0.1)
Basophils Relative: 1 % (ref 0–1)
Eosinophils Absolute: 0 10*3/uL (ref 0.0–0.7)
Eosinophils Relative: 1 % (ref 0–5)
HEMATOCRIT: 29.8 % — AB (ref 36.0–46.0)
HEMOGLOBIN: 9.5 g/dL — AB (ref 12.0–15.0)
LYMPHS PCT: 16 % (ref 12–46)
Lymphs Abs: 0.7 10*3/uL (ref 0.7–4.0)
MCH: 27.1 pg (ref 26.0–34.0)
MCHC: 31.9 g/dL (ref 30.0–36.0)
MCV: 85.1 fL (ref 78.0–100.0)
MONOS PCT: 4 % (ref 3–12)
Monocytes Absolute: 0.2 10*3/uL (ref 0.1–1.0)
NEUTROS ABS: 3.4 10*3/uL (ref 1.7–7.7)
NEUTROS PCT: 78 % — AB (ref 43–77)
PLATELETS: 120 10*3/uL — AB (ref 150–400)
RBC: 3.5 MIL/uL — ABNORMAL LOW (ref 3.87–5.11)
RDW: 18.1 % — AB (ref 11.5–15.5)
WBC: 4.3 10*3/uL (ref 4.0–10.5)

## 2014-10-11 LAB — COMPREHENSIVE METABOLIC PANEL
ALBUMIN: 3.9 g/dL (ref 3.5–5.0)
ALT: 15 U/L (ref 14–54)
ANION GAP: 9 (ref 5–15)
AST: 17 U/L (ref 15–41)
Alkaline Phosphatase: 73 U/L (ref 38–126)
BUN: 18 mg/dL (ref 6–20)
CHLORIDE: 106 mmol/L (ref 101–111)
CO2: 26 mmol/L (ref 22–32)
Calcium: 8.8 mg/dL — ABNORMAL LOW (ref 8.9–10.3)
Creatinine, Ser: 1.01 mg/dL — ABNORMAL HIGH (ref 0.44–1.00)
GFR calc Af Amer: 58 mL/min — ABNORMAL LOW (ref >60)
GFR, EST NON AFRICAN AMERICAN: 50 mL/min — AB (ref >60)
Glucose, Bld: 139 mg/dL — ABNORMAL HIGH (ref 65–99)
Potassium: 4.1 mmol/L (ref 3.5–5.1)
Sodium: 141 mmol/L (ref 135–145)
TOTAL PROTEIN: 6.8 g/dL (ref 6.5–8.1)
Total Bilirubin: 0.7 mg/dL (ref 0.3–1.2)

## 2014-10-11 MED ORDER — SODIUM CHLORIDE 0.9 % IV SOLN
Freq: Once | INTRAVENOUS | Status: AC
  Administered 2014-10-11: 10:00:00 via INTRAVENOUS

## 2014-10-11 MED ORDER — PACLITAXEL PROTEIN-BOUND CHEMO INJECTION 100 MG
125.0000 mg/m2 | Freq: Once | INTRAVENOUS | Status: AC
  Administered 2014-10-11: 225 mg via INTRAVENOUS
  Filled 2014-10-11: qty 45

## 2014-10-11 MED ORDER — SODIUM CHLORIDE 0.9 % IJ SOLN
10.0000 mL | INTRAMUSCULAR | Status: DC | PRN
  Administered 2014-10-11: 10 mL
  Filled 2014-10-11: qty 10

## 2014-10-11 MED ORDER — HEPARIN SOD (PORK) LOCK FLUSH 100 UNIT/ML IV SOLN
500.0000 [IU] | Freq: Once | INTRAVENOUS | Status: AC | PRN
  Administered 2014-10-11: 500 [IU]

## 2014-10-11 MED ORDER — SODIUM CHLORIDE 0.9 % IV SOLN: 8.0000 mg | Freq: Once | INTRAVENOUS | Status: DC

## 2014-10-11 MED ORDER — DEXAMETHASONE SODIUM PHOSPHATE 10 MG/ML IJ SOLN: 10.0000 mg | Freq: Once | INTRAMUSCULAR | Status: DC

## 2014-10-11 MED ORDER — SODIUM CHLORIDE 0.9 % IV SOLN
1000.0000 mg/m2 | Freq: Once | INTRAVENOUS | Status: AC
  Administered 2014-10-11: 1786 mg via INTRAVENOUS
  Filled 2014-10-11: qty 46.97

## 2014-10-11 MED ORDER — ONDANSETRON HCL 40 MG/20ML IJ SOLN
Freq: Once | INTRAMUSCULAR | Status: AC
  Administered 2014-10-11: 8 mg via INTRAVENOUS
  Filled 2014-10-11: qty 4

## 2014-10-11 NOTE — Progress Notes (Signed)
Tolerated well

## 2014-10-12 ENCOUNTER — Ambulatory Visit (HOSPITAL_COMMUNITY)
Admission: RE | Admit: 2014-10-12 | Discharge: 2014-10-12 | Disposition: A | Discharge status: Home / Self Care | Payer: Medicare Other | Source: Ambulatory Visit | Attending: Hematology & Oncology | Admitting: Hematology & Oncology

## 2014-10-12 ENCOUNTER — Other Ambulatory Visit (HOSPITAL_COMMUNITY): Payer: Self-pay | Admitting: Oncology

## 2014-10-12 DIAGNOSIS — C787 Secondary malignant neoplasm of liver and intrahepatic bile duct: Principal | ICD-10-CM

## 2014-10-12 DIAGNOSIS — C259 Malignant neoplasm of pancreas, unspecified: Secondary | ICD-10-CM

## 2014-10-17 ENCOUNTER — Ambulatory Visit (HOSPITAL_COMMUNITY): Payer: Medicare Other | Admitting: Hematology & Oncology

## 2014-10-18 ENCOUNTER — Telehealth (HOSPITAL_COMMUNITY): Payer: Self-pay | Admitting: *Deleted

## 2014-10-18 DIAGNOSIS — R300 Dysuria: Secondary | ICD-10-CM

## 2014-10-18 MED ORDER — SULFAMETHOXAZOLE-TRIMETHOPRIM 800-160 MG PO TABS: 1.0000 | ORAL_TABLET | Freq: Two times a day (BID) | ORAL | Status: DC

## 2014-10-18 NOTE — Telephone Encounter (Signed)
Patient notified that antibiotic called to [facility]

## 2014-10-18 NOTE — Telephone Encounter (Signed)
Patient had urinalysis 2 weeks ago, continues to have pressure and urinates numerous l times a day. She has back pain every morning on awakening and thinks it is related to her kidneys  Please advise

## 2014-10-26 ENCOUNTER — Other Ambulatory Visit (HOSPITAL_COMMUNITY): Payer: Self-pay | Admitting: Oncology

## 2014-10-26 ENCOUNTER — Ambulatory Visit (HOSPITAL_COMMUNITY)
Admission: RE | Admit: 2014-10-26 | Discharge: 2014-10-26 | Disposition: A | Discharge status: Home / Self Care | Payer: Medicare Other | Source: Ambulatory Visit | Attending: Oncology | Admitting: Oncology

## 2014-10-26 DIAGNOSIS — C259 Malignant neoplasm of pancreas, unspecified: Secondary | ICD-10-CM

## 2014-10-26 DIAGNOSIS — C787 Secondary malignant neoplasm of liver and intrahepatic bile duct: Secondary | ICD-10-CM | POA: Diagnosis not present

## 2014-10-26 DIAGNOSIS — C252 Malignant neoplasm of tail of pancreas: Secondary | ICD-10-CM | POA: Diagnosis not present

## 2014-10-26 MED ORDER — GADOBENATE DIMEGLUMINE 529 MG/ML IV SOLN
14.0000 mL | Freq: Once | INTRAVENOUS | Status: AC | PRN
  Administered 2014-10-26: 14 mL via INTRAVENOUS

## 2014-10-27 ENCOUNTER — Encounter (HOSPITAL_COMMUNITY): Payer: Self-pay | Admitting: Hematology & Oncology

## 2014-10-27 ENCOUNTER — Encounter (HOSPITAL_BASED_OUTPATIENT_CLINIC_OR_DEPARTMENT_OTHER): Payer: Medicare Other | Admitting: Hematology & Oncology

## 2014-10-27 VITALS — BP 121/68 | HR 58 | Temp 98.5°F | Resp 15 | Wt 154.0 lb

## 2014-10-27 DIAGNOSIS — C259 Malignant neoplasm of pancreas, unspecified: Secondary | ICD-10-CM | POA: Diagnosis not present

## 2014-10-27 DIAGNOSIS — C787 Secondary malignant neoplasm of liver and intrahepatic bile duct: Secondary | ICD-10-CM | POA: Diagnosis not present

## 2014-10-27 DIAGNOSIS — G620 Drug-induced polyneuropathy: Secondary | ICD-10-CM

## 2014-10-27 DIAGNOSIS — T451X5A Adverse effect of antineoplastic and immunosuppressive drugs, initial encounter: Secondary | ICD-10-CM

## 2014-10-27 NOTE — Progress Notes (Signed)
[name], MD [address], [address] [place] [address]   DIAGNOSIS: Pancreatic cancer metastasized to liver   Staging form: Pancreas, AJCC 7th Edition     Clinical: Stage IV (T3, N1, M1) - Signed by Baird Cancer, PA-C on 03/16/2014     Pathologic: No stage assigned - Unsigned  CONTRAST MEDIA ALLERGY  SUMMARY OF ONCOLOGIC HISTORY:   Pancreatic cancer metastasized to liver   03/10/2014 Initial Diagnosis Pancreatic cancer metastasized to liver   03/22/2014 -  Chemotherapy Abraxane/Gemzar days 1, 8, every 28 days.  Day 15 was cancelled due to leukopenia and thrombocytopenia on day 15 cycle 1.   05/24/2014 Treatment Plan Change Day 8 of cycle 3 is held with ANC of 1.1   06/05/2014 Imaging CT C/A/P . Interval decrease in size of the pancreatic tail mass.Improved hepatic metastatic disease. No new lesions. No CT findings for metastatic disease involving the chest.   08/09/2014 Tumor Marker CA 19-9= 33 (WNL)    CURRENT THERAPY: Gemzar/Abraxane  INTERVAL HISTORY: [name] 81 y.o. female returns for follow-up of her pancreatic cancer. She is doing fairly well. She notes that she attends church weekly. She gets stressed daily. She does all of her housework. She says she occasionally cooks. She continues to get out some with family and friends.   She is here alone today. She has been eating well. Her friend was going to join her today but couldn't come due to the timing of the appointment. She still feels physically able to continue treatment. 'It is not tiring her down'. She has treatment Wednesday and again the following Wednesday.   She notes some symptoms of neuropathy in her toes and fingers. She states it is not troublesome however. She has no difficulty walking. She has noticed some problems separating cards when she plays. But states it is not problematic nor painful.  She says she can't read like she used to and attributes it to her eyes. She has an eye  appointment scheduled for next Monday, 6/20. She currently has prescription glasses.  She mentioned that her boys are coming into town next week. She is no longer experiencing dysuria or frequency, the treatment given at the last visit alleviated it.  She is here today to review imaging studies.   MEDICAL HISTORY: Past Medical History  Diagnosis Date  . Hypertension 20 years   . Chronic diarrhea   . Anxiety   . Depression   . Kidney stone     hx/ crushed   . Hx of adenomatous colonic polyps     tubular adenomas, last found in 2008  . GERD (gastroesophageal reflux disease)   . Erosive esophagitis   . Schatzki's ring 08/26/10    Last dilated on EGD by Dr. Trevor I[facility], linear gastric erosions, BI hemigastrectomy  . Hyperplastic colon polyp 03/19/10    tcs by Dr. Gala Romney  . Pancreatic cancer 02/2014  . Pancreatic cancer metastasized to liver 03/10/2014    has Essential hypertension; ALLERGIC RHINITIS, SEASONAL; GERD (gastroesophageal reflux disease); Anxiety; Primary generalized (osteo)arthritis; Back pain with radiation; IGT (impaired glucose tolerance); Anemia, iron deficiency; Elevated LFTs; Change in stool habits; Abnormal liver ultrasound; Pancreatic cancer metastasized to liver; Allergic cough; and Loose stools on her problem list.     is allergic to iohexol; aciphex; amlodipine besylate-valsartan; esomeprazole magnesium; omeprazole; penicillins; and ciprofloxacin.  Ms. Ortner does not currently have medications on file.  SURGICAL HISTORY: Past Surgical History  Procedure Laterality Date  .  Stomach ulcer  50 years ago     had some of her stomach removed   . Bunion removal      from both feet   . Left shoulder surgery  2009    DR HARRISON  . Colonoscopy  03/19/2010    DR Gala Romney,, normal TI, pancolonic diverticula, random colon bx neg., hyperplastic polyps removed  . Esophagogastroduodenoscopy  11/22/2003    DR Gala Romney, erosive RE, Billroth I  . Esophagogastroduodenoscopy   08/26/10    Dr. Gala Romney- moderate severe ERE, Scahtzki ring s/p dilation, Billroth I, linear gastric erosions, bx-gastric xanthelasma  . Breast lumpectomy Left   . Orif ankle fracture Right 05/22/2013    Procedure: OPEN REDUCTION INTERNAL FIXATION (ORIF) RIGHT ANKLE FRACTURE;  Surgeon: Sanjuana Kava, MD;  Location: [facility];  Service: Orthopedics;  Laterality: Right;  . Cholecystectomy  1965     SOCIAL HISTORY: History   Social History  . Marital Status: Widowed    Spouse Name: N/A  . Number of Children: 2  . Years of Education: N/A   Occupational History  . retired from Teacher, adult education    Social History Main Topics  . Smoking status: Former Smoker    Types: Cigarettes    Quit date: 11/09/2011  . Smokeless tobacco: Never Used     Comment: quit a few weeks ago   . Alcohol Use: No  . Drug Use: No  . Sexual Activity: No   Other Topics Concern  . Not on file   Social History Narrative    FAMILY HISTORY: Family History  Problem Relation Age of Onset  . Hypertension Mother   . Kidney failure Brother     X1 ON DIALYSIS  . Diabetes Sister   . Cancer Sister     X2  . Hypertension Father   . Liver disease Neg Hx   . Colon cancer Neg Hx     Review of Systems  Constitutional: Positive for malaise/fatigue.  HENT: Negative.   Eyes: Positive for vision change. Negative for double vision, photophobia, pain, discharge and redness.  Respiratory: Negative.   Cardiovascular: Negative.   Gastrointestinal: Negative.   Genitourinary: Negative.  Musculoskeletal: Positive for joint pain.  Skin: Negative.   Neurological: Positive for weakness and neuropathy. Negative for dizziness, tingling, tremors, sensory change, speech change, focal weakness, seizures and loss of consciousness.       Neuropathy in her fingers and toes. Her fingers sometimes 'stick together'. Endo/Heme/Allergies: Negative.   Psychiatric/Behavioral: Negative.     PHYSICAL EXAMINATION  ECOG PERFORMANCE STATUS: 0 -  Asymptomatic  Filed Vitals:   10/27/14 1143  BP: 121/68  Pulse: 58  Temp: 98.5 F (36.9 C)  Resp: 15    Physical Exam  Constitutional: She is oriented to person, place, and time and well-developed, well-nourished, and in no distress.  Wearing hair prosthesis, well groomed  HENT:  Head: Normocephalic and atraumatic.  Nose: Nose normal.  Mouth/Throat: Oropharynx is clear and moist. No oropharyngeal exudate.  Eyes: Conjunctivae and EOM are normal. Pupils are equal, round, and reactive to light. Right eye exhibits no discharge. Left eye exhibits no discharge. No scleral icterus.  Neck: Normal range of motion. Neck supple. No tracheal deviation present. No thyromegaly present.  Cardiovascular: Normal rate, regular rhythm and normal heart sounds.  Exam reveals no gallop and no friction rub.   No murmur heard. Pulmonary/Chest: Effort normal and breath sounds normal. She has no wheezes. She has no rales.  Abdominal: Soft. Bowel sounds are normal. She  exhibits no distension and no mass. There is no tenderness. There is no rebound and no guarding.  Musculoskeletal: Normal range of motion. She exhibits no edema.  Lymphadenopathy:    She has no cervical adenopathy.  Neurological: She is alert and oriented to person, place, and time. She has normal reflexes. No cranial nerve deficit. Gait normal. Coordination normal.  Skin: Skin is warm and dry. No rash noted.  Psychiatric: Mood, memory, affect and judgment normal.  Nursing note and vitals reviewed.   LABORATORY DATA:  Results for ENDYA, AUSTIN (MRN [id number]) as of 11/13/2014 12:48  Ref. Range 10/11/2014 10:45  Sodium Latest Ref Range: 135-145 mmol/L 141  Potassium Latest Ref Range: 3.5-5.1 mmol/L 4.1  Chloride Latest Ref Range: 101-111 mmol/L 106  CO2 Latest Ref Range: 22-32 mmol/L 26  BUN Latest Ref Range: 6-20 mg/dL 18  Creatinine Latest Ref Range: 0.44-1.00 mg/dL 1.01 (H)  Calcium Latest Ref Range: 8.9-10.3 mg/dL 8.8 (L)  EGFR  (Non-African Amer.) Latest Ref Range: >60 mL/min 50 (L)  EGFR (African American) Latest Ref Range: >60 mL/min 58 (L)  Glucose Latest Ref Range: 65-99 mg/dL 139 (H)  Anion gap Latest Ref Range: 5-15  9  Alkaline Phosphatase Latest Ref Range: 38-126 U/L 73  Albumin Latest Ref Range: 3.5-5.0 g/dL 3.9  AST Latest Ref Range: 15-41 U/L 17  ALT Latest Ref Range: 14-54 U/L 15  Total Protein Latest Ref Range: 6.5-8.1 g/dL 6.8  Total Bilirubin Latest Ref Range: 0.3-1.2 mg/dL 0.7  WBC Latest Ref Range: 4.0-10.5 K/uL 4.3  RBC Latest Ref Range: 3.87-5.11 MIL/uL 3.50 (L)  Hemoglobin Latest Ref Range: 12.0-15.0 g/dL 9.5 (L)  HCT Latest Ref Range: 36.0-46.0 % 29.8 (L)  MCV Latest Ref Range: 78.0-100.0 fL 85.1  MCH Latest Ref Range: 26.0-34.0 pg 27.1  MCHC Latest Ref Range: 30.0-36.0 g/dL 31.9  RDW Latest Ref Range: 11.5-15.5 % 18.1 (H)  Platelets Latest Ref Range: 150-400 K/uL 120 (L)  Neutrophils Latest Ref Range: 43-77 % 78 (H)  Lymphocytes Latest Ref Range: 12-46 % 16  Monocytes Relative Latest Ref Range: 3-12 % 4  Eosinophil Latest Ref Range: 0-5 % 1  Basophil Latest Ref Range: 0-1 % 1  NEUT# Latest Ref Range: 1.7-7.7 K/uL 3.4  Lymphocyte # Latest Ref Range: 0.7-4.0 K/uL 0.7  Monocyte # Latest Ref Range: 0.1-1.0 K/uL 0.2  Eosinophils Absolute Latest Ref Range: 0.0-0.7 K/uL 0.0  Basophils Absolute Latest Ref Range: 0.0-0.1 K/uL 0.0      RADIOLOGY: CLINICAL DATA: Metastatic pancreatic cancer.  EXAM: MRI ABDOMEN WITHOUT AND WITH CONTRAST  TECHNIQUE: Multiplanar multisequence MR imaging of the abdomen was performed both before and after the administration of intravenous contrast.  CONTRAST: 56mL MULTIHANCE GADOBENATE DIMEGLUMINE 529 MG/ML IV SOLN  COMPARISON: CT scan 07/19/2014 and MRI 03/02/2014  FINDINGS: Lower chest: The lung bases are clear. No pulmonary lesions or pleural effusion. The heart is borderline enlarged but no pericardial effusion.  Hepatobiliary: The  right hepatic lobe liver lesion near the caudate lobe measures 12 mm and previously measured 17 mm. The small peripheral lesion in the right hepatic lobe measures 5 mm and was previously 8 mm. No new hepatic lesions. Stable mild intra and extrahepatic biliary dilatation without obvious common bile duct stone.  Pancreas: No pancreatic head mass or acute inflammation. The pancreatic tail mass has significantly improved. It is difficult to identify on the postcontrast images. It measures approximately 25 x 19 mm and measured 53 x 30 mm on the prior MRI and 27 x 26  mm on the prior CT.  Spleen: Normal.  Adrenals/Urinary Tract: Stable renal cysts. No worrisome renal lesions. The adrenal glands are normal.  Stomach/Bowel: The stomach, duodenum, visualized small bowel and visualized colon are unremarkable.  Vascular/Lymphatic: No abdominal lymphadenopathy is identified. Stable atherosclerotic changes involving the aorta. The branch vessels are patent. The major venous structures are patent.  Other: No ascites or abdominal wall hernia.  Musculoskeletal: No significant bony findings.  IMPRESSION: 1. Continued interval decrease in size of the hepatic metastatic lesions and no new lesions are identified. 2. Continued decrease in size of the pancreatic tail lesion. 3. No adenopathy. 4. Stable numerous small renal cysts. 5. Stable intra and extrahepatic biliary dilatation likely related to prior cholecystectomy.   Electronically Signed  By: Marijo Sanes M.D.  On: 10/26/2014 13:48   ASSESSMENT and THERAPY PLAN:   STAGE IV Pancreatic Cancer Ongoing response to therapy Mild chemotherapy induced neuopathy  81 year old female on Gemzar/Abraxane for stage IV pancreatic cancer. She is doing and has done remarkably well. She wishes to continue with therapy imaging study showed ongoing improvement in disease. She would like to continue with therapy. Her performance status  remains a 1.  She is developing mild neuropathy, currently it is not causing any interference with her ADLs. This will have to be closely monitored moving forward.  I will refill her prednisone.  I do not feel this is detrimental to her as she takes a low dose daily.   All questions were answered. The patient knows to call the clinic with any problems, questions or concerns. We can certainly see the patient much sooner if necessary.  This document serves as a record of services personally performed by Ancil Linsey, MD. It was created on her behalf by Arlyce Harman, a trained medical scribe. The creation of this record is based on the scribe's personal observations and the provider's statements to them. This document has been checked and approved by the attending provider.  I have reviewed the above documentation for accuracy and completeness, and I agree with the above.  This note was signed electronically. Molli Hazard MD 11/13/2014

## 2014-10-27 NOTE — Patient Instructions (Signed)
[place] at [facility] Discharge Instructions  RECOMMENDATIONS MADE BY THE CONSULTANT AND ANY TEST RESULTS WILL BE SENT TO YOUR REFERRING PHYSICIAN.  Exam and discussion by Dr. Whitney Muse. Scan shows continued improvement. Will continue current regimen. Call with any concerns.  Follow-up : Chemotherapy as scheduled and office visit in 2 weeks.  Thank you for choosing [place] at [facility] to provide your oncology and hematology care.  To afford each patient quality time with our provider, please arrive at least 15 minutes before your scheduled appointment time.    You need to re-schedule your appointment should you arrive 10 or more minutes late.  We strive to give you quality time with our providers, and arriving late affects you and other patients whose appointments are after yours.  Also, if you no show three or more times for appointments you may be dismissed from the clinic at the providers discretion.     Again, thank you for choosing [facility].  Our hope is that these requests will decrease the amount of time that you wait before being seen by our physicians.       _____________________________________________________________  Should you have questions after your visit to [facility], please contact our office at [phone] between the hours of 8:30 a.m. and 4:30 p.m.  Voicemails left after 4:30 p.m. will not be returned until the following business day.  For prescription refill requests, have your pharmacy contact our office.

## 2014-11-01 ENCOUNTER — Encounter (HOSPITAL_BASED_OUTPATIENT_CLINIC_OR_DEPARTMENT_OTHER): Payer: Medicare Other

## 2014-11-01 VITALS — BP 154/62 | HR 67 | Temp 98.1°F | Resp 14 | Wt 156.0 lb

## 2014-11-01 DIAGNOSIS — C801 Malignant (primary) neoplasm, unspecified: Secondary | ICD-10-CM | POA: Diagnosis not present

## 2014-11-01 DIAGNOSIS — C252 Malignant neoplasm of tail of pancreas: Secondary | ICD-10-CM

## 2014-11-01 DIAGNOSIS — C787 Secondary malignant neoplasm of liver and intrahepatic bile duct: Secondary | ICD-10-CM | POA: Diagnosis not present

## 2014-11-01 DIAGNOSIS — C259 Malignant neoplasm of pancreas, unspecified: Secondary | ICD-10-CM

## 2014-11-01 DIAGNOSIS — Z5111 Encounter for antineoplastic chemotherapy: Secondary | ICD-10-CM | POA: Diagnosis not present

## 2014-11-01 DIAGNOSIS — D509 Iron deficiency anemia, unspecified: Secondary | ICD-10-CM

## 2014-11-01 LAB — COMPREHENSIVE METABOLIC PANEL
ALK PHOS: 72 U/L (ref 38–126)
ALT: 13 U/L — AB (ref 14–54)
AST: 16 U/L (ref 15–41)
Albumin: 3.7 g/dL (ref 3.5–5.0)
Anion gap: 8 (ref 5–15)
BUN: 20 mg/dL (ref 6–20)
CO2: 26 mmol/L (ref 22–32)
Calcium: 8.7 mg/dL — ABNORMAL LOW (ref 8.9–10.3)
Chloride: 108 mmol/L (ref 101–111)
Creatinine, Ser: 0.93 mg/dL (ref 0.44–1.00)
GFR calc non Af Amer: 55 mL/min — ABNORMAL LOW (ref >60)
GLUCOSE: 98 mg/dL (ref 65–99)
POTASSIUM: 3.6 mmol/L (ref 3.5–5.1)
Sodium: 142 mmol/L (ref 135–145)
TOTAL PROTEIN: 7 g/dL (ref 6.5–8.1)
Total Bilirubin: 0.8 mg/dL (ref 0.3–1.2)

## 2014-11-01 LAB — CBC WITH DIFFERENTIAL/PLATELET
Basophils Absolute: 0 10*3/uL (ref 0.0–0.1)
Basophils Relative: 1 % (ref 0–1)
EOS ABS: 0.1 10*3/uL (ref 0.0–0.7)
Eosinophils Relative: 1 % (ref 0–5)
HCT: 30 % — ABNORMAL LOW (ref 36.0–46.0)
Hemoglobin: 9.5 g/dL — ABNORMAL LOW (ref 12.0–15.0)
LYMPHS ABS: 1.6 10*3/uL (ref 0.7–4.0)
LYMPHS PCT: 25 % (ref 12–46)
MCH: 26.8 pg (ref 26.0–34.0)
MCHC: 31.7 g/dL (ref 30.0–36.0)
MCV: 84.7 fL (ref 78.0–100.0)
Monocytes Absolute: 0.7 10*3/uL (ref 0.1–1.0)
Monocytes Relative: 11 % (ref 3–12)
NEUTROS PCT: 62 % (ref 43–77)
Neutro Abs: 4 10*3/uL (ref 1.7–7.7)
Platelets: 280 10*3/uL (ref 150–400)
RBC: 3.54 MIL/uL — AB (ref 3.87–5.11)
RDW: 19.1 % — ABNORMAL HIGH (ref 11.5–15.5)
WBC: 6.5 10*3/uL (ref 4.0–10.5)

## 2014-11-01 MED ORDER — GEMCITABINE HCL CHEMO INJECTION 1 GM/26.3ML
1000.0000 mg/m2 | Freq: Once | INTRAVENOUS | Status: AC
  Administered 2014-11-01: 1786 mg via INTRAVENOUS
  Filled 2014-11-01: qty 46.97

## 2014-11-01 MED ORDER — SODIUM CHLORIDE 0.9 % IV SOLN
Freq: Once | INTRAVENOUS | Status: AC
  Administered 2014-11-01: 8 mg via INTRAVENOUS
  Filled 2014-11-01: qty 4

## 2014-11-01 MED ORDER — SODIUM CHLORIDE 0.9 % IV SOLN: 8.0000 mg | Freq: Once | INTRAVENOUS | Status: DC

## 2014-11-01 MED ORDER — HEPARIN SOD (PORK) LOCK FLUSH 100 UNIT/ML IV SOLN
INTRAVENOUS | Status: AC
  Filled 2014-11-01: qty 5

## 2014-11-01 MED ORDER — DEXAMETHASONE SODIUM PHOSPHATE 10 MG/ML IJ SOLN: 10.0000 mg | Freq: Once | INTRAMUSCULAR | Status: DC

## 2014-11-01 MED ORDER — HEPARIN SOD (PORK) LOCK FLUSH 100 UNIT/ML IV SOLN
500.0000 [IU] | Freq: Once | INTRAVENOUS | Status: AC | PRN
  Administered 2014-11-01: 500 [IU]

## 2014-11-01 MED ORDER — SODIUM CHLORIDE 0.9 % IV SOLN
Freq: Once | INTRAVENOUS | Status: AC
  Administered 2014-11-01: 10:00:00 via INTRAVENOUS

## 2014-11-01 MED ORDER — PACLITAXEL PROTEIN-BOUND CHEMO INJECTION 100 MG
125.0000 mg/m2 | Freq: Once | INTRAVENOUS | Status: AC
  Administered 2014-11-01: 225 mg via INTRAVENOUS
  Filled 2014-11-01: qty 45

## 2014-11-01 MED ORDER — SODIUM CHLORIDE 0.9 % IJ SOLN
10.0000 mL | INTRAMUSCULAR | Status: DC | PRN
  Administered 2014-11-01: 10 mL
  Filled 2014-11-01: qty 10

## 2014-11-02 LAB — CANCER ANTIGEN 19-9: CA 19 9: 28 U/mL (ref 0–35)

## 2014-11-06 ENCOUNTER — Encounter (HOSPITAL_BASED_OUTPATIENT_CLINIC_OR_DEPARTMENT_OTHER): Payer: Medicare Other | Admitting: Oncology

## 2014-11-06 ENCOUNTER — Encounter (HOSPITAL_COMMUNITY): Payer: Self-pay | Admitting: Oncology

## 2014-11-06 DIAGNOSIS — C787 Secondary malignant neoplasm of liver and intrahepatic bile duct: Secondary | ICD-10-CM | POA: Diagnosis not present

## 2014-11-06 DIAGNOSIS — C259 Malignant neoplasm of pancreas, unspecified: Secondary | ICD-10-CM | POA: Diagnosis not present

## 2014-11-06 NOTE — Patient Instructions (Addendum)
[place] at [facility] Discharge Instructions  RECOMMENDATIONS MADE BY THE CONSULTANT AND ANY TEST RESULTS WILL BE SENT TO YOUR REFERRING PHYSICIAN.  Return as scheduled Wednesday  11/08/14 for chemo.  Thank you for choosing [place] at [facility] to provide your oncology and hematology care.  To afford each patient quality time with our provider, please arrive at least 15 minutes before your scheduled appointment time.    You need to re-schedule your appointment should you arrive 10 or more minutes late.  We strive to give you quality time with our providers, and arriving late affects you and other patients whose appointments are after yours.  Also, if you no show three or more times for appointments you may be dismissed from the clinic at the providers discretion.     Again, thank you for choosing [facility].  Our hope is that these requests will decrease the amount of time that you wait before being seen by our physicians.       _____________________________________________________________  Should you have questions after your visit to [facility], please contact our office at [phone] between the hours of 8:30 a.m. and 4:30 p.m.  Voicemails left after 4:30 p.m. will not be returned until the following business day.  For prescription refill requests, have your pharmacy contact our office.

## 2014-11-06 NOTE — Progress Notes (Signed)
[name] Nakayama, MD [address] [place] [address]  No diagnosis found.  CURRENT THERAPY: Gemzar/Abraxane  INTERVAL HISTORY: [name] 81 y.o. female returns for followup of Stage IV Pancreatic Cancer, on systemic therapy with Abraxane and Gemzar in a day 1, 8 fashion every 28 days.    Pancreatic cancer metastasized to liver   03/10/2014 Initial Diagnosis Pancreatic cancer metastasized to liver   03/22/2014 -  Chemotherapy Abraxane/Gemzar days 1, 8, every 28 days.  Day 15 was cancelled due to leukopenia and thrombocytopenia on day 15 cycle 1.   05/24/2014 Treatment Plan Change Day 8 of cycle 3 is held with ANC of 1.1   06/05/2014 Imaging CT C/A/P . Interval decrease in size of the pancreatic tail mass.Improved hepatic metastatic disease. No new lesions. No CT findings for metastatic disease involving the chest.   08/09/2014 Tumor Marker CA 19-9= 33 (WNL)    I personally reviewed and went over radiographic studies with the patient.  The results are noted within this dictation.    The patient is here today with her two sons and grandson Larkin Ina) at the patient's request to review her images with her and her sons.  I personally reviewed and went over radiographic studies with the patient.  The results are noted within this dictation.  I reviewed the images with the patient and family.  We compared past images to most recent, illustrating the shrinkage in her malignancy.  When I got into the room and logged into [facility] and started bringing up images, the one son reports "Terrence Dupont.  We've been waiting here for a while, can you get me something to drink?"  I logged out, took their drink order and walked out of the exam room to retrieve their Cokes, diet coke, and water.  I returned and provided them their drink of choice.  I then proceeded to start over.  Her one son, who I have never met, asked a number of questions that her mother should have answered for him  instead of him waiting to see me for the first time.  I reviewed her diagnosis, incurability of disease, role of treatment, etc.  I have encouraged him to look information up on the Internet regarding pancreatic cancer.  Past Medical History  Diagnosis Date  . Hypertension 20 years   . Chronic diarrhea   . Anxiety   . Depression   . Kidney stone     hx/ crushed   . Hx of adenomatous colonic polyps     tubular adenomas, last found in 2008  . GERD (gastroesophageal reflux disease)   . Erosive esophagitis   . Schatzki's ring 08/26/10    Last dilated on EGD by Dr. Trevor [facility], linear gastric erosions, BI hemigastrectomy  . Hyperplastic colon polyp 03/19/10    tcs by Dr. Gala Romney  . Pancreatic cancer 02/2014  . Pancreatic cancer metastasized to liver 03/10/2014    has Essential hypertension; ALLERGIC RHINITIS, SEASONAL; GERD (gastroesophageal reflux disease); Anxiety; Primary generalized (osteo)arthritis; Back pain with radiation; IGT (impaired glucose tolerance); Anemia, iron deficiency; Elevated LFTs; Change in stool habits; Abnormal liver ultrasound; Pancreatic cancer metastasized to liver; Allergic cough; and Loose stools on her problem list.     is allergic to iohexol; aciphex; amlodipine besylate-valsartan; esomeprazole magnesium; omeprazole; penicillins; and ciprofloxacin.  Current Outpatient Prescriptions on File Prior to Visit  Medication Sig Dispense Refill  . amLODipine-olmesartan (AZOR) 10-40 MG per tablet Take 1 tablet by  mouth daily. 90 tablet 1  . aspirin 81 MG EC tablet Take 81 mg by mouth every morning.     Marland Kitchen BYSTOLIC 10 MG tablet TAKE 1 TABLET DAILY 90 tablet 1  . cetirizine (ZYRTEC) 10 MG tablet Take 1 tablet (10 mg total) by mouth every morning. 30 tablet 3  . cholestyramine light (PREVALITE) 4 GM/DOSE powder 1 scoop daily 239.4 g 5  . clonazePAM (KLONOPIN) 0.5 MG tablet Take 1 tablet (0.5 mg total) by mouth 2 (two) times daily as needed for anxiety. 60 tablet 3  .  cycloSPORINE (RESTASIS) 0.05 % ophthalmic emulsion Place 1 drop into both eyes 2 (two) times daily.    . diclofenac sodium (VOLTAREN) 1 % GEL Apply  daily, as needed, to lower back, for uncontrolled pain (Patient taking differently: Apply 2-4 g topically daily as needed (lower bck for uncontrolled pain.). ) 100 g 0  . famotidine (PEPCID) 20 MG tablet Take 1 tablet (20 mg total) by mouth 2 (two) times daily. 10 tablet 0  . ferrous sulfate 325 (65 FE) MG tablet Take 325 mg by mouth daily with breakfast.    . HYDROcodone-acetaminophen (NORCO) 7.5-325 MG per tablet Take 1 tablet by mouth every 4 (four) hours as needed for moderate pain. 60 tablet 0  . lidocaine-prilocaine (EMLA) cream Apply a quarter size amount to port site 1 hour prior to chemo. Do not rub in. Cover with plastic wrap. 30 g 3  . OVER THE COUNTER MEDICATION     . pantoprazole (PROTONIX) 20 MG tablet Take 1 tablet (20 mg total) by mouth daily. 90 tablet 0  . predniSONE (DELTASONE) 5 MG tablet Take 1 tablet (5 mg total) by mouth daily with breakfast. 30 tablet 1  . Probiotic Product (HEALTHY COLON PO) Take 1 tablet by mouth every morning.     . prochlorperazine (COMPAZINE) 10 MG tablet Take 1 tablet (10 mg total) by mouth every 6 (six) hours as needed for nausea or vomiting. 60 tablet 2  . triamcinolone (NASACORT ALLERGY 24HR) 55 MCG/ACT AERO nasal inhaler Place 2 sprays into the nose daily. 1 Inhaler 12  . benzonatate (TESSALON) 200 MG capsule Take 1 capsule (200 mg total) by mouth 3 (three) times daily as needed for cough. (Patient not taking: Reported on 10/27/2014) 30 capsule 0  . senna (SENOKOT) 8.6 MG tablet Take 1 tablet by mouth daily.    . [DISCONTINUED] DiphenhydrAMINE HCl (ALKA-SELTZER PLUS ALLERGY PO) Take 1 tablet by mouth as needed.     No current facility-administered medications on file prior to visit.    Past Surgical History  Procedure Laterality Date  . Stomach ulcer  50 years ago     had some of her stomach removed    . Bunion removal      from both feet   . Left shoulder surgery  2009    DR HARRISON  . Colonoscopy  03/19/2010    DR Gala Romney,, normal TI, pancolonic diverticula, random colon bx neg., hyperplastic polyps removed  . Esophagogastroduodenoscopy  11/22/2003    DR Gala Romney, erosive RE, Billroth I  . Esophagogastroduodenoscopy  08/26/10    Dr. Gala Romney- moderate severe ERE, Scahtzki ring s/p dilation, Billroth I, linear gastric erosions, bx-gastric xanthelasma  . Breast lumpectomy Left   . Orif ankle fracture Right 05/22/2013    Procedure: OPEN REDUCTION INTERNAL FIXATION (ORIF) RIGHT ANKLE FRACTURE;  Surgeon: Sanjuana Kava, MD;  Location: [facility];  Service: Orthopedics;  Laterality: Right;  . Cholecystectomy  1965  Denies any headaches, dizziness, double vision, fevers, chills, night sweats, nausea, vomiting, diarrhea, constipation, chest pain, heart palpitations, shortness of breath, blood in stool, black tarry stool, urinary pain, urinary burning, urinary frequency, hematuria.   PHYSICAL EXAMINATION  ECOG PERFORMANCE STATUS: 0 - Asymptomatic  There were no vitals filed for this visit.  GENERAL:alert, no distress, well nourished, well developed, comfortable, cooperative and smiling SKIN: skin color, texture, turgor are normal, no rashes or significant lesions HEAD: Normocephalic, No masses, lesions, tenderness or abnormalities EYES: normal, EOMI, Conjunctiva are pink and non-injected EARS: External ears normal OROPHARYNX:mucous membranes are moist  NECK: supple, trachea midline LYMPH:  not examined BREAST:not examined LUNGS: not examined HEART: not examined ABDOMEN:not examined BACK: not examined EXTREMITIES:less then 2 second capillary refill, no joint deformities, effusion, or inflammation, no skin discoloration, no cyanosis  NEURO: alert & oriented x 3 with fluent speech, no focal motor/sensory deficits, gait normal    LABORATORY DATA: CBC    Component Value Date/Time   WBC 6.5  11/01/2014 0945   RBC 3.54* 11/01/2014 0945   HGB 9.5* 11/01/2014 0945   HCT 30.0* 11/01/2014 0945   PLT 280 11/01/2014 0945   MCV 84.7 11/01/2014 0945   MCH 26.8 11/01/2014 0945   MCHC 31.7 11/01/2014 0945   RDW 19.1* 11/01/2014 0945   LYMPHSABS 1.6 11/01/2014 0945   MONOABS 0.7 11/01/2014 0945   EOSABS 0.1 11/01/2014 0945   BASOSABS 0.0 11/01/2014 0945      Chemistry      Component Value Date/Time   NA 142 11/01/2014 0945   K 3.6 11/01/2014 0945   CL 108 11/01/2014 0945   CO2 26 11/01/2014 0945   BUN 20 11/01/2014 0945   CREATININE 0.93 11/01/2014 0945   CREATININE 0.97 02/14/2014 0758      Component Value Date/Time   CALCIUM 8.7* 11/01/2014 0945   ALKPHOS 72 11/01/2014 0945   AST 16 11/01/2014 0945   ALT 13* 11/01/2014 0945   BILITOT 0.8 11/01/2014 0945      RADIOGRAPHIC STUDIES:  Mr Abdomen W Wo Contrast  10/26/2014   CLINICAL DATA:  Metastatic pancreatic cancer.  EXAM: MRI ABDOMEN WITHOUT AND WITH CONTRAST  TECHNIQUE: Multiplanar multisequence MR imaging of the abdomen was performed both before and after the administration of intravenous contrast.  CONTRAST:  68m MULTIHANCE GADOBENATE DIMEGLUMINE 529 MG/ML IV SOLN  COMPARISON:  CT scan 07/19/2014 and MRI 03/02/2014  FINDINGS: Lower chest: The lung bases are clear. No pulmonary lesions or pleural effusion. The heart is borderline enlarged but no pericardial effusion.  Hepatobiliary: The right hepatic lobe liver lesion near the caudate lobe measures 12 mm and previously measured 17 mm. The small peripheral lesion in the right hepatic lobe measures 5 mm and was previously 8 mm. No new hepatic lesions. Stable mild intra and extrahepatic biliary dilatation without obvious common bile duct stone.  Pancreas: No pancreatic head mass or acute inflammation. The pancreatic tail mass has significantly improved. It is difficult to identify on the postcontrast images. It measures approximately 25 x 19 mm and measured 53 x 30 mm on the  prior MRI and 27 x 26 mm on the prior CT.  Spleen: Normal.  Adrenals/Urinary Tract: Stable renal cysts. No worrisome renal lesions. The adrenal glands are normal.  Stomach/Bowel: The stomach, duodenum, visualized small bowel and visualized colon are unremarkable.  Vascular/Lymphatic: No abdominal lymphadenopathy is identified. Stable atherosclerotic changes involving the aorta. The branch vessels are patent. The major venous structures are patent.  Other:  No ascites or abdominal wall hernia.  Musculoskeletal: No significant bony findings.  IMPRESSION: 1. Continued interval decrease in size of the hepatic metastatic lesions and no new lesions are identified. 2. Continued decrease in size of the pancreatic tail lesion. 3. No adenopathy. 4. Stable numerous small renal cysts. 5. Stable intra and extrahepatic biliary dilatation likely related to prior cholecystectomy.   Electronically Signed   By: Marijo Sanes M.D.   On: 10/26/2014 13:48   Mr 3d Recon At Scanner  10/26/2014   CLINICAL DATA:  Metastatic pancreatic cancer.  EXAM: MRI ABDOMEN WITHOUT AND WITH CONTRAST  TECHNIQUE: Multiplanar multisequence MR imaging of the abdomen was performed both before and after the administration of intravenous contrast.  CONTRAST:  68m MULTIHANCE GADOBENATE DIMEGLUMINE 529 MG/ML IV SOLN  COMPARISON:  CT scan 07/19/2014 and MRI 03/02/2014  FINDINGS: Lower chest: The lung bases are clear. No pulmonary lesions or pleural effusion. The heart is borderline enlarged but no pericardial effusion.  Hepatobiliary: The right hepatic lobe liver lesion near the caudate lobe measures 12 mm and previously measured 17 mm. The small peripheral lesion in the right hepatic lobe measures 5 mm and was previously 8 mm. No new hepatic lesions. Stable mild intra and extrahepatic biliary dilatation without obvious common bile duct stone.  Pancreas: No pancreatic head mass or acute inflammation. The pancreatic tail mass has significantly improved. It is  difficult to identify on the postcontrast images. It measures approximately 25 x 19 mm and measured 53 x 30 mm on the prior MRI and 27 x 26 mm on the prior CT.  Spleen: Normal.  Adrenals/Urinary Tract: Stable renal cysts. No worrisome renal lesions. The adrenal glands are normal.  Stomach/Bowel: The stomach, duodenum, visualized small bowel and visualized colon are unremarkable.  Vascular/Lymphatic: No abdominal lymphadenopathy is identified. Stable atherosclerotic changes involving the aorta. The branch vessels are patent. The major venous structures are patent.  Other: No ascites or abdominal wall hernia.  Musculoskeletal: No significant bony findings.  IMPRESSION: 1. Continued interval decrease in size of the hepatic metastatic lesions and no new lesions are identified. 2. Continued decrease in size of the pancreatic tail lesion. 3. No adenopathy. 4. Stable numerous small renal cysts. 5. Stable intra and extrahepatic biliary dilatation likely related to prior cholecystectomy.   Electronically Signed   By: PMarijo SanesM.D.   On: 10/26/2014 13:48      ASSESSMENT AND PLAN:  No problem-specific assessment & plan notes found for this encounter.   THERAPY PLAN:  Continue plan as scheduled.  All questions were answered. The patient knows to call the clinic with any problems, questions or concerns. We can certainly see the patient much sooner if necessary.  Patient and plan discussed with Dr. SAncil Linseyand she is in agreement with the aforementioned.   This note is electronically signed by: KDoy Mince6/27/2016 4:09 PM

## 2014-11-06 NOTE — Assessment & Plan Note (Signed)
Stage IV Pancreatic Cancer, on systemic therapy with Abraxane and Gemzar.  I personally reviewed and went over radiographic studies with the patient.  The results are noted within this dictation.  I reviewed in detail the images and answered some of the patient's son's questions.  Return as scheduled.

## 2014-11-08 ENCOUNTER — Ambulatory Visit (HOSPITAL_COMMUNITY): Payer: Medicare Other | Admitting: Oncology

## 2014-11-08 ENCOUNTER — Encounter (HOSPITAL_COMMUNITY): Payer: Self-pay

## 2014-11-08 ENCOUNTER — Encounter (HOSPITAL_BASED_OUTPATIENT_CLINIC_OR_DEPARTMENT_OTHER): Payer: Medicare Other

## 2014-11-08 ENCOUNTER — Encounter: Payer: Self-pay | Admitting: *Deleted

## 2014-11-08 VITALS — BP 163/75 | HR 62 | Temp 98.0°F | Resp 16 | Wt 156.0 lb

## 2014-11-08 DIAGNOSIS — D509 Iron deficiency anemia, unspecified: Secondary | ICD-10-CM

## 2014-11-08 DIAGNOSIS — C801 Malignant (primary) neoplasm, unspecified: Secondary | ICD-10-CM | POA: Diagnosis not present

## 2014-11-08 DIAGNOSIS — C259 Malignant neoplasm of pancreas, unspecified: Secondary | ICD-10-CM | POA: Diagnosis not present

## 2014-11-08 DIAGNOSIS — Z5111 Encounter for antineoplastic chemotherapy: Secondary | ICD-10-CM

## 2014-11-08 DIAGNOSIS — C252 Malignant neoplasm of tail of pancreas: Secondary | ICD-10-CM | POA: Diagnosis not present

## 2014-11-08 DIAGNOSIS — C787 Secondary malignant neoplasm of liver and intrahepatic bile duct: Secondary | ICD-10-CM

## 2014-11-08 LAB — CBC WITH DIFFERENTIAL/PLATELET
BASOS PCT: 0 % (ref 0–1)
Basophils Absolute: 0 10*3/uL (ref 0.0–0.1)
Eosinophils Absolute: 0 10*3/uL (ref 0.0–0.7)
Eosinophils Relative: 0 % (ref 0–5)
HEMATOCRIT: 28.6 % — AB (ref 36.0–46.0)
Hemoglobin: 9.1 g/dL — ABNORMAL LOW (ref 12.0–15.0)
Lymphocytes Relative: 21 % (ref 12–46)
Lymphs Abs: 0.8 10*3/uL (ref 0.7–4.0)
MCH: 27 pg (ref 26.0–34.0)
MCHC: 31.8 g/dL (ref 30.0–36.0)
MCV: 84.9 fL (ref 78.0–100.0)
MONO ABS: 0.1 10*3/uL (ref 0.1–1.0)
Monocytes Relative: 2 % — ABNORMAL LOW (ref 3–12)
Neutro Abs: 2.7 10*3/uL (ref 1.7–7.7)
Neutrophils Relative %: 76 % (ref 43–77)
Platelets: 117 10*3/uL — ABNORMAL LOW (ref 150–400)
RBC: 3.37 MIL/uL — ABNORMAL LOW (ref 3.87–5.11)
RDW: 18.6 % — AB (ref 11.5–15.5)
WBC: 3.5 10*3/uL — ABNORMAL LOW (ref 4.0–10.5)

## 2014-11-08 LAB — COMPREHENSIVE METABOLIC PANEL
ALK PHOS: 70 U/L (ref 38–126)
ALT: 17 U/L (ref 14–54)
ANION GAP: 6 (ref 5–15)
AST: 16 U/L (ref 15–41)
Albumin: 3.7 g/dL (ref 3.5–5.0)
BUN: 19 mg/dL (ref 6–20)
CHLORIDE: 108 mmol/L (ref 101–111)
CO2: 26 mmol/L (ref 22–32)
Calcium: 8.5 mg/dL — ABNORMAL LOW (ref 8.9–10.3)
Creatinine, Ser: 1.12 mg/dL — ABNORMAL HIGH (ref 0.44–1.00)
GFR, EST AFRICAN AMERICAN: 51 mL/min — AB (ref >60)
GFR, EST NON AFRICAN AMERICAN: 44 mL/min — AB (ref >60)
GLUCOSE: 126 mg/dL — AB (ref 65–99)
POTASSIUM: 4 mmol/L (ref 3.5–5.1)
SODIUM: 140 mmol/L (ref 135–145)
Total Bilirubin: 0.9 mg/dL (ref 0.3–1.2)
Total Protein: 6.7 g/dL (ref 6.5–8.1)

## 2014-11-08 MED ORDER — SODIUM CHLORIDE 0.9 % IV SOLN
Freq: Once | INTRAVENOUS | Status: AC
  Administered 2014-11-08: 8 mg via INTRAVENOUS
  Filled 2014-11-08: qty 4

## 2014-11-08 MED ORDER — SODIUM CHLORIDE 0.9 % IV SOLN: 8.0000 mg | Freq: Once | INTRAVENOUS | Status: DC

## 2014-11-08 MED ORDER — HEPARIN SOD (PORK) LOCK FLUSH 100 UNIT/ML IV SOLN
500.0000 [IU] | Freq: Once | INTRAVENOUS | Status: AC | PRN
  Administered 2014-11-08: 500 [IU]
  Filled 2014-11-08: qty 5

## 2014-11-08 MED ORDER — SODIUM CHLORIDE 0.9 % IV SOLN
Freq: Once | INTRAVENOUS | Status: AC
  Administered 2014-11-08: 10:00:00 via INTRAVENOUS

## 2014-11-08 MED ORDER — PACLITAXEL PROTEIN-BOUND CHEMO INJECTION 100 MG
125.0000 mg/m2 | Freq: Once | INTRAVENOUS | Status: AC
  Administered 2014-11-08: 225 mg via INTRAVENOUS
  Filled 2014-11-08: qty 45

## 2014-11-08 MED ORDER — DEXAMETHASONE SODIUM PHOSPHATE 10 MG/ML IJ SOLN: 10.0000 mg | Freq: Once | INTRAMUSCULAR | Status: DC

## 2014-11-08 MED ORDER — SODIUM CHLORIDE 0.9 % IV SOLN
1000.0000 mg/m2 | Freq: Once | INTRAVENOUS | Status: AC
  Administered 2014-11-08: 1786 mg via INTRAVENOUS
  Filled 2014-11-08: qty 46.97

## 2014-11-08 MED ORDER — SODIUM CHLORIDE 0.9 % IJ SOLN
10.0000 mL | INTRAMUSCULAR | Status: DC | PRN
  Administered 2014-11-08: 10 mL
  Filled 2014-11-08: qty 10

## 2014-11-08 NOTE — Progress Notes (Signed)
[facility] Clinical Social Work  Clinical Social Work was referred by CSW rounding for re-assessment of needs.  Clinical Social Worker met with patient at [facility] to offer support and assess for needs. No new needs identified. CSW encouraged pt to attend Cancer Support Group next month. She will consider.    Clinical Social Work interventions: Reassess needs Resource education   Grier Ranada Vigorito, LCSW [facility] Tuesdays 8:30-1pm Wednesdays 8:30-12pm  [phone]  

## 2014-11-08 NOTE — Patient Instructions (Signed)
[facility] Discharge Instructions for Patients Receiving Chemotherapy  Today you received the following chemotherapy agents:  Abraxane and Gemzar  To help prevent nausea and vomiting after your treatment, we encourage you to take your nausea medication as directed.   If you develop nausea and vomiting, or diarrhea that is not controlled by your medication, call the clinic.  The clinic phone number is [phone]. Office hours are Monday-Friday 8:30am-5:00pm.  BELOW ARE SYMPTOMS THAT SHOULD BE REPORTED IMMEDIATELY:  *FEVER GREATER THAN 101.0 F  *CHILLS WITH OR WITHOUT FEVER  NAUSEA AND VOMITING THAT IS NOT CONTROLLED WITH YOUR NAUSEA MEDICATION  *UNUSUAL SHORTNESS OF BREATH  *UNUSUAL BRUISING OR BLEEDING  TENDERNESS IN MOUTH AND THROAT WITH OR WITHOUT PRESENCE OF ULCERS  *URINARY PROBLEMS  *BOWEL PROBLEMS  UNUSUAL RASH Items with * indicate a potential emergency and should be followed up as soon as possible. If you have an emergency after office hours please contact your primary care physician or go to the nearest emergency department.  Please call the clinic during office hours if you have any questions or concerns.   You may also contact the Patient Navigator at [phone] should you have any questions or need assistance in obtaining follow up care. _____________________________________________________________________ Have you asked about our STAR program?    STAR stands for Survivorship Training and Rehabilitation, and this is a nationally recognized cancer care program that focuses on survivorship and rehabilitation.  Cancer and cancer treatments may cause problems, such as, pain, making you feel tired and keeping you from doing the things that you need or want to do. Cancer rehabilitation can help. Our goal is to reduce these troubling effects and help you have the best quality of life possible.  You may receive a survey from a nurse that asks  questions about your current state of health.  Based on the survey results, all eligible patients will be referred to the [facility] program for an evaluation so we can better serve you! A frequently asked questions sheet is available upon request.

## 2014-11-09 LAB — CANCER ANTIGEN 19-9: CA 19-9: 23 U/mL (ref 0–35)

## 2014-11-10 ENCOUNTER — Other Ambulatory Visit: Payer: Self-pay | Admitting: Family Medicine

## 2014-11-13 ENCOUNTER — Encounter (HOSPITAL_COMMUNITY): Payer: Self-pay | Admitting: Hematology & Oncology

## 2014-11-14 ENCOUNTER — Telehealth: Payer: Self-pay | Admitting: Family Medicine

## 2014-11-14 ENCOUNTER — Other Ambulatory Visit: Payer: Self-pay | Admitting: Family Medicine

## 2014-11-14 ENCOUNTER — Telehealth: Payer: Self-pay | Admitting: *Deleted

## 2014-11-14 NOTE — Telephone Encounter (Signed)
Pls call pt, ask if she can try to change to zantac for heartburn as the protonix, interferes with iron absorption and she is anemic, pls send zantac 150 mg twice daily if she agrees and d/c protonix

## 2014-11-14 NOTE — Telephone Encounter (Signed)
pls find out from pt how many per day she is taking if two per day, Iam only able to send in 2 months, over 100 klonopin is alrge quantity of this type of med to keep up with, if she takes one daily I will send 90, explain to her and GET back to me before sending please

## 2014-11-14 NOTE — Telephone Encounter (Signed)
Do you agree with giving patient 90 day supply of Klonopin?

## 2014-11-14 NOTE — Telephone Encounter (Signed)
Pt called requesting to get clorapam refilled in a 90 day supply with [facility] pt states she gets a month now pt would like 90 days because a month runs out to fast

## 2014-11-15 ENCOUNTER — Other Ambulatory Visit (HOSPITAL_COMMUNITY): Payer: Self-pay | Admitting: Hematology & Oncology

## 2014-11-15 MED ORDER — CLONAZEPAM 0.5 MG PO TABS: 0.5000 mg | ORAL_TABLET | Freq: Two times a day (BID) | ORAL | Status: DC | PRN

## 2014-11-15 NOTE — Addendum Note (Signed)
Addended by: Denman George B on: 11/15/2014 01:06 PM   Modules accepted: Orders

## 2014-11-15 NOTE — Telephone Encounter (Signed)
Patient states that she will continue to get 30 day supplies and will fill locally. Request sent to mail order to discontinue this med from her list.

## 2014-11-15 NOTE — Telephone Encounter (Signed)
Patient states that this is the only medicine that works for her GERD.  She would like to know is it an option for her to take as needed instead of daily.

## 2014-11-15 NOTE — Telephone Encounter (Signed)
Patient aware.

## 2014-11-15 NOTE — Telephone Encounter (Signed)
Yes she can try that option, , also let her know that I will send a message to her cancer Doc so that if she feels as though she needs iron supplement she can arrange thiss Last year her iron level was goofd, but now she is slightl;y anemic , together with the oncologist/ hematologist the iron will be addressed. ince she need her reflux med take every day if she needs it like that

## 2014-11-22 ENCOUNTER — Encounter (HOSPITAL_COMMUNITY): Payer: Medicare Other | Attending: Hematology and Oncology

## 2014-11-22 VITALS — BP 160/65 | HR 57 | Temp 97.6°F | Resp 18

## 2014-11-22 DIAGNOSIS — C259 Malignant neoplasm of pancreas, unspecified: Secondary | ICD-10-CM | POA: Diagnosis not present

## 2014-11-22 DIAGNOSIS — C801 Malignant (primary) neoplasm, unspecified: Secondary | ICD-10-CM | POA: Diagnosis not present

## 2014-11-22 DIAGNOSIS — C252 Malignant neoplasm of tail of pancreas: Secondary | ICD-10-CM | POA: Insufficient documentation

## 2014-11-22 DIAGNOSIS — C787 Secondary malignant neoplasm of liver and intrahepatic bile duct: Secondary | ICD-10-CM

## 2014-11-22 DIAGNOSIS — D509 Iron deficiency anemia, unspecified: Secondary | ICD-10-CM

## 2014-11-22 DIAGNOSIS — Z5111 Encounter for antineoplastic chemotherapy: Secondary | ICD-10-CM | POA: Diagnosis not present

## 2014-11-22 LAB — COMPREHENSIVE METABOLIC PANEL
ALBUMIN: 3.9 g/dL (ref 3.5–5.0)
ALT: 15 U/L (ref 14–54)
AST: 16 U/L (ref 15–41)
Alkaline Phosphatase: 77 U/L (ref 38–126)
Anion gap: 10 (ref 5–15)
BUN: 20 mg/dL (ref 6–20)
CHLORIDE: 105 mmol/L (ref 101–111)
CO2: 25 mmol/L (ref 22–32)
Calcium: 8.4 mg/dL — ABNORMAL LOW (ref 8.9–10.3)
Creatinine, Ser: 1.11 mg/dL — ABNORMAL HIGH (ref 0.44–1.00)
GFR calc non Af Amer: 45 mL/min — ABNORMAL LOW (ref >60)
GFR, EST AFRICAN AMERICAN: 52 mL/min — AB (ref >60)
GLUCOSE: 127 mg/dL — AB (ref 65–99)
POTASSIUM: 4.1 mmol/L (ref 3.5–5.1)
Sodium: 140 mmol/L (ref 135–145)
Total Bilirubin: 0.8 mg/dL (ref 0.3–1.2)
Total Protein: 6.8 g/dL (ref 6.5–8.1)

## 2014-11-22 LAB — CBC WITH DIFFERENTIAL/PLATELET
BASOS ABS: 0 10*3/uL (ref 0.0–0.1)
BASOS PCT: 0 % (ref 0–1)
Eosinophils Absolute: 0 10*3/uL (ref 0.0–0.7)
Eosinophils Relative: 1 % (ref 0–5)
HCT: 29.4 % — ABNORMAL LOW (ref 36.0–46.0)
Hemoglobin: 9.4 g/dL — ABNORMAL LOW (ref 12.0–15.0)
LYMPHS ABS: 0.6 10*3/uL — AB (ref 0.7–4.0)
Lymphocytes Relative: 15 % (ref 12–46)
MCH: 27.2 pg (ref 26.0–34.0)
MCHC: 32 g/dL (ref 30.0–36.0)
MCV: 85 fL (ref 78.0–100.0)
Monocytes Absolute: 0.2 10*3/uL (ref 0.1–1.0)
Monocytes Relative: 5 % (ref 3–12)
NEUTROS ABS: 3.3 10*3/uL (ref 1.7–7.7)
Neutrophils Relative %: 79 % — ABNORMAL HIGH (ref 43–77)
PLATELETS: 212 10*3/uL (ref 150–400)
RBC: 3.46 MIL/uL — AB (ref 3.87–5.11)
RDW: 19.9 % — ABNORMAL HIGH (ref 11.5–15.5)
WBC: 4.2 10*3/uL (ref 4.0–10.5)

## 2014-11-22 MED ORDER — SODIUM CHLORIDE 0.9 % IV SOLN
Freq: Once | INTRAVENOUS | Status: AC
  Administered 2014-11-22: 8 mg via INTRAVENOUS
  Filled 2014-11-22: qty 4

## 2014-11-22 MED ORDER — HEPARIN SOD (PORK) LOCK FLUSH 100 UNIT/ML IV SOLN
INTRAVENOUS | Status: AC
  Filled 2014-11-22: qty 5

## 2014-11-22 MED ORDER — HEPARIN SOD (PORK) LOCK FLUSH 100 UNIT/ML IV SOLN
500.0000 [IU] | Freq: Once | INTRAVENOUS | Status: AC | PRN
  Administered 2014-11-22: 500 [IU]

## 2014-11-22 MED ORDER — SODIUM CHLORIDE 0.9 % IV SOLN: 8.0000 mg | Freq: Once | INTRAVENOUS | Status: DC

## 2014-11-22 MED ORDER — SODIUM CHLORIDE 0.9 % IV SOLN
1000.0000 mg/m2 | Freq: Once | INTRAVENOUS | Status: AC
  Administered 2014-11-22: 1786 mg via INTRAVENOUS
  Filled 2014-11-22: qty 46.97

## 2014-11-22 MED ORDER — SODIUM CHLORIDE 0.9 % IJ SOLN
10.0000 mL | INTRAMUSCULAR | Status: DC | PRN
  Administered 2014-11-22: 10 mL
  Filled 2014-11-22: qty 10

## 2014-11-22 MED ORDER — DEXAMETHASONE SODIUM PHOSPHATE 10 MG/ML IJ SOLN: 10.0000 mg | Freq: Once | INTRAMUSCULAR | Status: DC

## 2014-11-22 MED ORDER — SODIUM CHLORIDE 0.9 % IV SOLN
Freq: Once | INTRAVENOUS | Status: AC
  Administered 2014-11-22: 10:00:00 via INTRAVENOUS

## 2014-11-22 MED ORDER — PACLITAXEL PROTEIN-BOUND CHEMO INJECTION 100 MG
125.0000 mg/m2 | Freq: Once | INTRAVENOUS | Status: AC
Start: 2014-11-22 — End: 2014-11-22
  Administered 2014-11-22: 225 mg via INTRAVENOUS
  Filled 2014-11-22: qty 45

## 2014-11-22 NOTE — Progress Notes (Signed)
Patient tolerated infusion well.  VSS 30 minutes post infusion.   

## 2014-11-22 NOTE — Patient Instructions (Signed)
..  [facility] Discharge Instructions for Patients Receiving Chemotherapy  Today you received the following chemotherapy agents Abraxane and Gemzar.  To help prevent nausea and vomiting after your treatment, we encourage you to take your nausea medication Zofran 8mg  every 8 hours as needed for nausea or vomiting Begin taking it as needeed and take it as often as prescribed for the next 48 hours.   If you develop nausea and vomiting, or diarrhea that is not controlled by your medication, call the clinic.  The clinic phone number is [phone]. Office hours are Monday-Friday 8:30am-5:00pm.  BELOW ARE SYMPTOMS THAT SHOULD BE REPORTED IMMEDIATELY:  *FEVER GREATER THAN 101.0 F  *CHILLS WITH OR WITHOUT FEVER  NAUSEA AND VOMITING THAT IS NOT CONTROLLED WITH YOUR NAUSEA MEDICATION  *UNUSUAL SHORTNESS OF BREATH  *UNUSUAL BRUISING OR BLEEDING  TENDERNESS IN MOUTH AND THROAT WITH OR WITHOUT PRESENCE OF ULCERS  *URINARY PROBLEMS  *BOWEL PROBLEMS  UNUSUAL RASH Items with * indicate a potential emergency and should be followed up as soon as possible. If you have an emergency after office hours please contact your primary care physician or go to the nearest emergency department.  Please call the clinic during office hours if you have any questions or concerns.   You may also contact the Patient Navigator at [phone] should you have any questions or need assistance in obtaining follow up care. _____________________________________________________________________ Have you asked about our STAR program?    STAR stands for Survivorship Training and Rehabilitation, and this is a nationally recognized cancer care program that focuses on survivorship and rehabilitation.  Cancer and cancer treatments may cause problems, such as, pain, making you feel tired and keeping you from doing the things that you need or want to do. Cancer rehabilitation can help. Our goal is to reduce  these troubling effects and help you have the best quality of life possible.  You may receive a survey from a nurse that asks questions about your current state of health.  Based on the survey results, all eligible patients will be referred to the [facility] program for an evaluation so we can better serve you! A frequently asked questions sheet is available upon request.

## 2014-11-28 NOTE — Assessment & Plan Note (Addendum)
Stage IV Pancreatic Cancer, on systemic therapy with Abraxane and Gemzar.  Pre-chemo labs as ordered.  I have added an anemia panel to check iron stores.  Additionally, I will check folate and B12 levels given PN symptoms.  Continue Flonase for clear nasal discharge  Patient education provided as dictated above.  Return in 4 weeks for follow-up and continuation of treatment.

## 2014-11-28 NOTE — Progress Notes (Signed)
[name], MD [address], [address] [place] [address]  Pancreatic cancer metastasized to liver - Plan: CBC with Differential, Comprehensive metabolic panel  Peripheral neuropathy due to chemotherapy - Plan: Vitamin B12, Folate  Anemia, iron deficiency - Plan: Iron and TIBC, Ferritin  CURRENT THERAPY: Gemzar/Abraxane  INTERVAL HISTORY: [name] 81 y.o. female returns for followup of Stage IV Pancreatic Cancer, on systemic therapy with Abraxane and Gemzar in a day 1, 8 fashion every 28 days.    Pancreatic cancer metastasized to liver   03/10/2014 Initial Diagnosis Pancreatic cancer metastasized to liver   03/22/2014 -  Chemotherapy Abraxane/Gemzar days 1, 8, every 28 days.  Day 15 was cancelled due to leukopenia and thrombocytopenia on day 15 cycle 1.   05/24/2014 Treatment Plan Change Day 8 of cycle 3 is held with ANC of 1.1   06/05/2014 Imaging CT C/A/P . Interval decrease in size of the pancreatic tail mass.Improved hepatic metastatic disease. No new lesions. No CT findings for metastatic disease involving the chest.   08/09/2014 Tumor Marker CA 19-9= 33 (WNL)    I personally reviewed and went over laboratory results with the patient.  The results are noted within this dictation.  She has a number of issues that I have addressed today: 1. Nose runs all the time- this is likely a vasomotor response.  Abraxane not known to participate in this side effect.  Better with Flonase treatment. 2. What reasons would I stop chemo? Toxicities, progression, blood work abnormalities... 3. Weaker at times-  Likely chemotherapy-induced.  Most common side effect of systemic chemotherapy. 4. Sores in nose, lips, left inframammary fold, and groin x 2 days, resolved with treatment at home with Camphor.  Sores resolved today. 5. Neuropathy in feet have progressed.  It used to only affect toes, but now it is the whole foot.  She reports that it is only at night and is  completely resolved by AM.  She denies any issues with ambulation.  She denies any falls. This is a grade 1. 6. Was told by Dr. Moshe Cipro that she needed to take an iron pill because she is anemic.  Her anemia is most likely chemotherapy-induced.  Will check iron studies today.  If normal, will defer continuation of iron to primary care provider.   Past Medical History  Diagnosis Date  . Hypertension 20 years   . Chronic diarrhea   . Anxiety   . Depression   . Kidney stone     hx/ crushed   . Hx of adenomatous colonic polyps     tubular adenomas, last found in 2008  . GERD (gastroesophageal reflux disease)   . Erosive esophagitis   . Schatzki's ring 08/26/10    Last dilated on EGD by Dr. Trevor [facility], linear gastric erosions, BI hemigastrectomy  . Hyperplastic colon polyp 03/19/10    tcs by Dr. Gala Romney  . Pancreatic cancer 02/2014  . Pancreatic cancer metastasized to liver 03/10/2014    has Essential hypertension; ALLERGIC RHINITIS, SEASONAL; GERD (gastroesophageal reflux disease); Anxiety; Primary generalized (osteo)arthritis; Back pain with radiation; IGT (impaired glucose tolerance); Anemia, iron deficiency; Elevated LFTs; Change in stool habits; Abnormal liver ultrasound; Pancreatic cancer metastasized to liver; Allergic cough; and Loose stools on her problem list.     is allergic to iohexol; aciphex; amlodipine besylate-valsartan; esomeprazole magnesium; omeprazole; penicillins; and ciprofloxacin.  Current Outpatient Prescriptions on File Prior to Visit  Medication Sig Dispense Refill  . amLODipine-olmesartan (AZOR)  10-40 MG per tablet Take 1 tablet by mouth daily. 90 tablet 1  . aspirin 81 MG EC tablet Take 81 mg by mouth every morning.     . benzonatate (TESSALON) 200 MG capsule Take 1 capsule (200 mg total) by mouth 3 (three) times daily as needed for cough. 30 capsule 0  . BYSTOLIC 10 MG tablet TAKE 1 TABLET DAILY 90 tablet 0  . cetirizine (ZYRTEC) 10 MG tablet Take 1 tablet  (10 mg total) by mouth every morning. 30 tablet 3  . cholestyramine light (PREVALITE) 4 GM/DOSE powder TAKE 1 SCOOP BY MOUTH DAILY 239.4 g 0  . clonazePAM (KLONOPIN) 0.5 MG tablet Take 1 tablet (0.5 mg total) by mouth 2 (two) times daily as needed for anxiety. 60 tablet 2  . cycloSPORINE (RESTASIS) 0.05 % ophthalmic emulsion Place 1 drop into both eyes 2 (two) times daily.    . diclofenac sodium (VOLTAREN) 1 % GEL Apply  daily, as needed, to lower back, for uncontrolled pain (Patient taking differently: Apply 2-4 g topically daily as needed (lower bck for uncontrolled pain.). ) 100 g 0  . famotidine (PEPCID) 20 MG tablet Take 1 tablet (20 mg total) by mouth 2 (two) times daily. (Patient not taking: Reported on 11/29/2014) 10 tablet 0  . ferrous sulfate 325 (65 FE) MG tablet Take 325 mg by mouth daily with breakfast.    . HYDROcodone-acetaminophen (NORCO) 7.5-325 MG per tablet Take 1 tablet by mouth every 4 (four) hours as needed for moderate pain. 60 tablet 0  . lidocaine-prilocaine (EMLA) cream Apply a quarter size amount to port site 1 hour prior to chemo. Do not rub in. Cover with plastic wrap. 30 g 3  . OVER THE COUNTER MEDICATION     . pantoprazole (PROTONIX) 20 MG tablet Take 1 tablet (20 mg total) by mouth daily. (Patient not taking: Reported on 11/29/2014) 90 tablet 0  . predniSONE (DELTASONE) 5 MG tablet Take 1 tablet (5 mg total) by mouth daily with breakfast. 30 tablet 1  . Probiotic Product (HEALTHY COLON PO) Take 1 tablet by mouth every morning.     . prochlorperazine (COMPAZINE) 10 MG tablet Take 1 tablet (10 mg total) by mouth every 6 (six) hours as needed for nausea or vomiting. 60 tablet 2  . senna (SENOKOT) 8.6 MG tablet Take 1 tablet by mouth daily.    Marland Kitchen triamcinolone (NASACORT ALLERGY 24HR) 55 MCG/ACT AERO nasal inhaler Place 2 sprays into the nose daily. 1 Inhaler 12  . [DISCONTINUED] DiphenhydrAMINE HCl (ALKA-SELTZER PLUS ALLERGY PO) Take 1 tablet by mouth as needed.     Current  Facility-Administered Medications on File Prior to Visit  Medication Dose Route Frequency Provider Last Rate Last Dose  . 0.9 %  sodium chloride infusion   Intravenous Once Patrici Ranks, MD      . gemcitabine (GEMZAR) 1,786 mg in sodium chloride 0.9 % 100 mL chemo infusion  1,000 mg/m2 (Treatment Plan Actual) Intravenous Once Patrici Ranks, MD      . heparin lock flush 100 unit/mL  500 Units Intracatheter Once PRN Patrici Ranks, MD      . ondansetron (ZOFRAN) 8 mg, dexamethasone (DECADRON) 10 mg in sodium chloride 0.9 % 50 mL IVPB   Intravenous Once Patrici Ranks, MD      . PACLitaxel-protein bound (ABRAXANE) chemo infusion 225 mg  125 mg/m2 (Treatment Plan Actual) Intravenous Once Patrici Ranks, MD      . sodium chloride 0.9 %  injection 10 mL  10 mL Intracatheter PRN Patrici Ranks, MD        Past Surgical History  Procedure Laterality Date  . Stomach ulcer  50 years ago     had some of her stomach removed   . Bunion removal      from both feet   . Left shoulder surgery  2009    DR HARRISON  . Colonoscopy  03/19/2010    DR Gala Romney,, normal TI, pancolonic diverticula, random colon bx neg., hyperplastic polyps removed  . Esophagogastroduodenoscopy  11/22/2003    DR Gala Romney, erosive RE, Billroth I  . Esophagogastroduodenoscopy  08/26/10    Dr. Gala Romney- moderate severe ERE, Scahtzki ring s/p dilation, Billroth I, linear gastric erosions, bx-gastric xanthelasma  . Breast lumpectomy Left   . Orif ankle fracture Right 05/22/2013    Procedure: OPEN REDUCTION INTERNAL FIXATION (ORIF) RIGHT ANKLE FRACTURE;  Surgeon: Sanjuana Kava, MD;  Location: [facility];  Service: Orthopedics;  Laterality: Right;  . Cholecystectomy  1965     Denies any headaches, dizziness, double vision, fevers, chills, night sweats, nausea, vomiting, diarrhea, constipation, chest pain, heart palpitations, shortness of breath, blood in stool, black tarry stool, urinary pain, urinary burning, urinary frequency,  hematuria.   PHYSICAL EXAMINATION  ECOG PERFORMANCE STATUS: 0 - Asymptomatic  There were no vitals filed for this visit.  GENERAL:alert, no distress, well nourished, well developed, comfortable, cooperative and smiling SKIN: skin color, texture, turgor are normal, no rashes or significant lesions HEAD: Normocephalic, No masses, lesions, tenderness or abnormalities EYES: normal, EOMI, Conjunctiva are pink and non-injected EARS: External ears normal OROPHARYNX:mucous membranes are moist  NECK: supple, trachea midline, no adenopathy LYMPH:  negative BREAST:not examined LUNGS: CTA B/L HEART: RRR  ABDOMEN:+ BS x 4, nontender, soft. BACK: not examined EXTREMITIES:less then 2 second capillary refill, no joint deformities, effusion, or inflammation, no skin discoloration, no cyanosis  NEURO: alert & oriented x 3 with fluent speech, no focal motor/sensory deficits, gait normal    LABORATORY DATA: CBC    Component Value Date/Time   WBC 3.1* 11/29/2014 1000   RBC 3.47* 11/29/2014 1000   HGB 9.5* 11/29/2014 1000   HCT 29.4* 11/29/2014 1000   PLT 178 11/29/2014 1000   MCV 84.7 11/29/2014 1000   MCH 27.4 11/29/2014 1000   MCHC 32.3 11/29/2014 1000   RDW 19.1* 11/29/2014 1000   LYMPHSABS 0.7 11/29/2014 1000   MONOABS 0.1 11/29/2014 1000   EOSABS 0.0 11/29/2014 1000   BASOSABS 0.0 11/29/2014 1000      Chemistry      Component Value Date/Time   NA 138 11/29/2014 1000   K 4.3 11/29/2014 1000   CL 101 11/29/2014 1000   CO2 27 11/29/2014 1000   BUN 17 11/29/2014 1000   CREATININE 1.09* 11/29/2014 1000   CREATININE 0.97 02/14/2014 0758      Component Value Date/Time   CALCIUM 8.8* 11/29/2014 1000   ALKPHOS 79 11/29/2014 1000   AST 18 11/29/2014 1000   ALT 17 11/29/2014 1000   BILITOT 0.6 11/29/2014 1000      RADIOGRAPHIC STUDIES:  No results found.    ASSESSMENT AND PLAN:  Pancreatic cancer metastasized to liver Stage IV Pancreatic Cancer, on systemic therapy with  Abraxane and Gemzar.  Pre-chemo labs as ordered.  I have added an anemia panel to check iron stores.  Additionally, I will check folate and B12 levels given PN symptoms.  Continue Flonase for clear nasal discharge  Patient education provided as  dictated above.  Return in 4 weeks for follow-up and continuation of treatment.     THERAPY PLAN:  Continue plan as scheduled.  All questions were answered. The patient knows to call the clinic with any problems, questions or concerns. We can certainly see the patient much sooner if necessary.  Patient and plan discussed with Dr. Ancil Linsey and she is in agreement with the aforementioned.   This note is electronically signed by: Doy Mince 11/29/2014 10:54 AM

## 2014-11-29 ENCOUNTER — Encounter (HOSPITAL_BASED_OUTPATIENT_CLINIC_OR_DEPARTMENT_OTHER): Payer: Medicare Other

## 2014-11-29 ENCOUNTER — Encounter (HOSPITAL_BASED_OUTPATIENT_CLINIC_OR_DEPARTMENT_OTHER): Payer: Medicare Other | Admitting: Oncology

## 2014-11-29 VITALS — BP 156/74 | HR 68 | Temp 98.0°F | Resp 20 | Wt 154.8 lb

## 2014-11-29 DIAGNOSIS — D509 Iron deficiency anemia, unspecified: Secondary | ICD-10-CM

## 2014-11-29 DIAGNOSIS — C259 Malignant neoplasm of pancreas, unspecified: Secondary | ICD-10-CM

## 2014-11-29 DIAGNOSIS — T451X5A Adverse effect of antineoplastic and immunosuppressive drugs, initial encounter: Secondary | ICD-10-CM

## 2014-11-29 DIAGNOSIS — Z5111 Encounter for antineoplastic chemotherapy: Secondary | ICD-10-CM

## 2014-11-29 DIAGNOSIS — C787 Secondary malignant neoplasm of liver and intrahepatic bile duct: Secondary | ICD-10-CM

## 2014-11-29 DIAGNOSIS — G620 Drug-induced polyneuropathy: Secondary | ICD-10-CM

## 2014-11-29 DIAGNOSIS — D649 Anemia, unspecified: Secondary | ICD-10-CM

## 2014-11-29 DIAGNOSIS — C252 Malignant neoplasm of tail of pancreas: Secondary | ICD-10-CM | POA: Diagnosis not present

## 2014-11-29 DIAGNOSIS — C801 Malignant (primary) neoplasm, unspecified: Secondary | ICD-10-CM | POA: Diagnosis not present

## 2014-11-29 LAB — CBC WITH DIFFERENTIAL/PLATELET
Basophils Absolute: 0 10*3/uL (ref 0.0–0.1)
Basophils Relative: 1 % (ref 0–1)
EOS ABS: 0 10*3/uL (ref 0.0–0.7)
Eosinophils Relative: 0 % (ref 0–5)
HCT: 29.4 % — ABNORMAL LOW (ref 36.0–46.0)
HEMOGLOBIN: 9.5 g/dL — AB (ref 12.0–15.0)
Lymphocytes Relative: 21 % (ref 12–46)
Lymphs Abs: 0.7 10*3/uL (ref 0.7–4.0)
MCH: 27.4 pg (ref 26.0–34.0)
MCHC: 32.3 g/dL (ref 30.0–36.0)
MCV: 84.7 fL (ref 78.0–100.0)
MONO ABS: 0.1 10*3/uL (ref 0.1–1.0)
Monocytes Relative: 3 % (ref 3–12)
NEUTROS ABS: 2.4 10*3/uL (ref 1.7–7.7)
Neutrophils Relative %: 75 % (ref 43–77)
Platelets: 178 10*3/uL (ref 150–400)
RBC: 3.47 MIL/uL — ABNORMAL LOW (ref 3.87–5.11)
RDW: 19.1 % — ABNORMAL HIGH (ref 11.5–15.5)
WBC: 3.1 10*3/uL — ABNORMAL LOW (ref 4.0–10.5)

## 2014-11-29 LAB — COMPREHENSIVE METABOLIC PANEL
ALT: 17 U/L (ref 14–54)
AST: 18 U/L (ref 15–41)
Albumin: 3.9 g/dL (ref 3.5–5.0)
Alkaline Phosphatase: 79 U/L (ref 38–126)
Anion gap: 10 (ref 5–15)
BUN: 17 mg/dL (ref 6–20)
CALCIUM: 8.8 mg/dL — AB (ref 8.9–10.3)
CO2: 27 mmol/L (ref 22–32)
CREATININE: 1.09 mg/dL — AB (ref 0.44–1.00)
Chloride: 101 mmol/L (ref 101–111)
GFR, EST AFRICAN AMERICAN: 53 mL/min — AB (ref >60)
GFR, EST NON AFRICAN AMERICAN: 46 mL/min — AB (ref >60)
GLUCOSE: 140 mg/dL — AB (ref 65–99)
POTASSIUM: 4.3 mmol/L (ref 3.5–5.1)
SODIUM: 138 mmol/L (ref 135–145)
Total Bilirubin: 0.6 mg/dL (ref 0.3–1.2)
Total Protein: 6.9 g/dL (ref 6.5–8.1)

## 2014-11-29 LAB — IRON AND TIBC
Iron: 23 ug/dL — ABNORMAL LOW (ref 28–170)
Saturation Ratios: 8 % — ABNORMAL LOW (ref 10.4–31.8)
TIBC: 305 ug/dL (ref 250–450)
UIBC: 282 ug/dL

## 2014-11-29 LAB — VITAMIN B12: VITAMIN B 12: 199 pg/mL (ref 180–914)

## 2014-11-29 LAB — FOLATE: FOLATE: 8.5 ng/mL (ref >5.9)

## 2014-11-29 LAB — FERRITIN: Ferritin: 160 ng/mL (ref 11–307)

## 2014-11-29 MED ORDER — PACLITAXEL PROTEIN-BOUND CHEMO INJECTION 100 MG
125.0000 mg/m2 | Freq: Once | INTRAVENOUS | Status: AC
  Administered 2014-11-29: 225 mg via INTRAVENOUS
  Filled 2014-11-29: qty 45

## 2014-11-29 MED ORDER — SODIUM CHLORIDE 0.9 % IV SOLN
1000.0000 mg/m2 | Freq: Once | INTRAVENOUS | Status: AC
  Administered 2014-11-29: 1786 mg via INTRAVENOUS
  Filled 2014-11-29: qty 46.97

## 2014-11-29 MED ORDER — SODIUM CHLORIDE 0.9 % IV SOLN: 8.0000 mg | Freq: Once | INTRAVENOUS | Status: DC

## 2014-11-29 MED ORDER — SODIUM CHLORIDE 0.9 % IV SOLN
Freq: Once | INTRAVENOUS | Status: AC
  Administered 2014-11-29: 11:00:00 via INTRAVENOUS

## 2014-11-29 MED ORDER — HEPARIN SOD (PORK) LOCK FLUSH 100 UNIT/ML IV SOLN
500.0000 [IU] | Freq: Once | INTRAVENOUS | Status: AC | PRN
  Administered 2014-11-29: 500 [IU]

## 2014-11-29 MED ORDER — SODIUM CHLORIDE 0.9 % IV SOLN
Freq: Once | INTRAVENOUS | Status: AC
  Administered 2014-11-29: 8 mg via INTRAVENOUS
  Filled 2014-11-29: qty 4

## 2014-11-29 MED ORDER — HEPARIN SOD (PORK) LOCK FLUSH 100 UNIT/ML IV SOLN
INTRAVENOUS | Status: AC
  Filled 2014-11-29: qty 5

## 2014-11-29 MED ORDER — DEXAMETHASONE SODIUM PHOSPHATE 10 MG/ML IJ SOLN: 10.0000 mg | Freq: Once | INTRAMUSCULAR | Status: DC

## 2014-11-29 MED ORDER — SODIUM CHLORIDE 0.9 % IJ SOLN
10.0000 mL | INTRAMUSCULAR | Status: DC | PRN
  Administered 2014-11-29: 10 mL
  Filled 2014-11-29: qty 10

## 2014-11-29 NOTE — Progress Notes (Signed)
Tolerated chemo well. 

## 2014-11-29 NOTE — Patient Instructions (Signed)
[place] at [facility] Discharge Instructions  RECOMMENDATIONS MADE BY THE CONSULTANT AND ANY TEST RESULTS WILL BE SENT TO YOUR REFERRING PHYSICIAN.  Exam and discussion by Robynn Pane, PA-C No changes in therapy Continue Flonase for runny nose. Report fevers, uncontrolled nausea, vomiting or other concerns.  Follow-up in 4 weeks with labs, chemotherapy and office visit.  Thank you for choosing [place] at [facility] to provide your oncology and hematology care.  To afford each patient quality time with our provider, please arrive at least 15 minutes before your scheduled appointment time.    You need to re-schedule your appointment should you arrive 10 or more minutes late.  We strive to give you quality time with our providers, and arriving late affects you and other patients whose appointments are after yours.  Also, if you no show three or more times for appointments you may be dismissed from the clinic at the providers discretion.     Again, thank you for choosing [facility].  Our hope is that these requests will decrease the amount of time that you wait before being seen by our physicians.       _____________________________________________________________  Should you have questions after your visit to [facility], please contact our office at [phone] between the hours of 8:30 a.m. and 4:30 p.m.  Voicemails left after 4:30 p.m. will not be returned until the following business day.  For prescription refill requests, have your pharmacy contact our office.

## 2014-12-04 ENCOUNTER — Telehealth (HOSPITAL_COMMUNITY): Payer: Self-pay

## 2014-12-04 NOTE — Telephone Encounter (Signed)
Call from patient with complaints of watery eyes and runny nose.  Already taking zyrtec and using the Nasacort nasal spray. No fevers, is coughing but phlegm is clear.  Wants to know if there is anything else she can do.  Also c/o redness and irritation beneath her breasts.  Has used campho-phenic and powder on the areas and they are not getting any better.

## 2014-12-04 NOTE — Telephone Encounter (Signed)
She should follow-up with primary care provider or ophthalmology.  Robynn Pane, PA-C 12/04/2014 4:28 PM

## 2014-12-05 NOTE — Telephone Encounter (Signed)
Patient notified

## 2014-12-12 ENCOUNTER — Ambulatory Visit (INDEPENDENT_AMBULATORY_CARE_PROVIDER_SITE_OTHER): Payer: Medicare Other | Admitting: Family Medicine

## 2014-12-12 ENCOUNTER — Encounter: Payer: Self-pay | Admitting: Family Medicine

## 2014-12-12 VITALS — BP 140/72 | HR 64 | Resp 16 | Ht 64.0 in | Wt 152.0 lb

## 2014-12-12 DIAGNOSIS — R195 Other fecal abnormalities: Secondary | ICD-10-CM

## 2014-12-12 DIAGNOSIS — I10 Essential (primary) hypertension: Secondary | ICD-10-CM

## 2014-12-12 DIAGNOSIS — J3089 Other allergic rhinitis: Secondary | ICD-10-CM | POA: Diagnosis not present

## 2014-12-12 DIAGNOSIS — E559 Vitamin D deficiency, unspecified: Secondary | ICD-10-CM

## 2014-12-12 DIAGNOSIS — K219 Gastro-esophageal reflux disease without esophagitis: Secondary | ICD-10-CM

## 2014-12-12 DIAGNOSIS — C259 Malignant neoplasm of pancreas, unspecified: Secondary | ICD-10-CM

## 2014-12-12 DIAGNOSIS — R05 Cough: Secondary | ICD-10-CM

## 2014-12-12 DIAGNOSIS — D509 Iron deficiency anemia, unspecified: Secondary | ICD-10-CM

## 2014-12-12 DIAGNOSIS — R059 Cough, unspecified: Secondary | ICD-10-CM | POA: Insufficient documentation

## 2014-12-12 DIAGNOSIS — C787 Secondary malignant neoplasm of liver and intrahepatic bile duct: Secondary | ICD-10-CM

## 2014-12-12 DIAGNOSIS — J309 Allergic rhinitis, unspecified: Secondary | ICD-10-CM | POA: Insufficient documentation

## 2014-12-12 DIAGNOSIS — Z1211 Encounter for screening for malignant neoplasm of colon: Secondary | ICD-10-CM

## 2014-12-12 DIAGNOSIS — F419 Anxiety disorder, unspecified: Secondary | ICD-10-CM

## 2014-12-12 DIAGNOSIS — R058 Other specified cough: Secondary | ICD-10-CM

## 2014-12-12 LAB — POC HEMOCCULT BLD/STL (OFFICE/1-CARD/DIAGNOSTIC): Fecal Occult Blood, POC: POSITIVE — AB

## 2014-12-12 MED ORDER — NEBIVOLOL HCL 10 MG PO TABS: 10.0000 mg | ORAL_TABLET | Freq: Every day | ORAL | Status: DC

## 2014-12-12 MED ORDER — CLONAZEPAM 0.5 MG PO TABS: 0.5000 mg | ORAL_TABLET | Freq: Two times a day (BID) | ORAL | Status: DC | PRN

## 2014-12-12 MED ORDER — BENZONATATE 100 MG PO CAPS: 100.0000 mg | ORAL_CAPSULE | Freq: Two times a day (BID) | ORAL | Status: DC | PRN

## 2014-12-12 MED ORDER — AZELASTINE HCL 0.1 % NA SOLN: 2.0000 | Freq: Two times a day (BID) | NASAL | Status: DC

## 2014-12-12 MED ORDER — MONTELUKAST SODIUM 10 MG PO TABS: 10.0000 mg | ORAL_TABLET | Freq: Every day | ORAL | Status: DC

## 2014-12-12 NOTE — Patient Instructions (Addendum)
Annual wellness as before  New medication for allergic cough, and runny nose  Astellin, singulair, tessalon perles are new for runny nose and cough    You are doing remarkably well, I am SO very thankful that you are responding very well to cancer treatment  You have no infection causing cough and runny nose, your allergies are uncontrolled  Fasting lipid, vit , and TSH for next visit  Blood in stool today will follow up on this

## 2014-12-13 ENCOUNTER — Other Ambulatory Visit: Payer: Self-pay | Admitting: Family Medicine

## 2014-12-18 DIAGNOSIS — R195 Other fecal abnormalities: Secondary | ICD-10-CM | POA: Insufficient documentation

## 2014-12-18 NOTE — Assessment & Plan Note (Signed)
Uncontrolled, continued post nasal drip and cough, add astelin

## 2014-12-18 NOTE — Assessment & Plan Note (Signed)
currently being treated and tolerating treatment well with good response

## 2014-12-18 NOTE — Assessment & Plan Note (Signed)
Uncontrolled , add singulair 

## 2014-12-18 NOTE — Assessment & Plan Note (Signed)
Will alert pt's GI Doc and also her oncologist so that appropriate follow up is coordinated

## 2014-12-18 NOTE — Assessment & Plan Note (Signed)
Controlled, no change in medication  

## 2014-12-18 NOTE — Assessment & Plan Note (Signed)
Iron deficiency on supplement, exam at todays's visit shows heme positive stool which is new, will alert both ehr oncologist and GI Docs so follow up is coordinated

## 2014-12-18 NOTE — Progress Notes (Signed)
   [name]     MRN: [id number]      DOB: 1929-03-04   HPI [name] is here with main complaint of excess cough x 1 week, no fever or chills, sputum is clear at times thick Has increased post nasal drip , worse when she is lying down, increased in past 1 week.  follow up and re-evaluation of chronic medical conditions, medication management and review of any available recent lab and radiology data also done Preventive health is updated, specifically  Cancer screening and Immunization.   Questions or concerns regarding consultations or procedures which the PT has had in the interim are  addressed.   ROS Denies recent fever or chills.  Denies chest pains, palpitations and leg swelling Denies abdominal pain, nausea, vomiting,diarrhea or constipation.   Denies dysuria, frequency, hesitancy or incontinence. Denies joint pain, swelling and limitation in mobility. Denies headaches, seizures, numbness, or tingling. Denies depression, anxiety or insomnia. Denies skin break down or rash.   PE  BP 140/72 mmHg  Pulse 64  Resp 16  Ht 5\' 4"  (1.626 m)  Wt 152 lb (68.947 kg)  BMI 26.08 kg/m2  SpO2 96%  Patient alert and oriented and in no cardiopulmonary distress.  HEENT: No facial asymmetry, EOMI,   oropharynx pink and moist.  Neck supple no JVD, no mass.  Chest: Clear to auscultation bilaterally.  CVS: S1, S2 no murmurs, no S3.Regular rate.  ABD: Soft non tender.  Rectal : heme positive stool, no mass  Ext: No edema  MS: Adequate ROM spine, shoulders, hips and knees.  Skin: Intact, no ulcerations or rash noted.  Psych: Good eye contact, normal affect. Memory intact not anxious or depressed appearing.  CNS: CN 2-12 intact, power,  normal throughout.no focal deficits noted.   Assessment & Plan   Allergic cough Uncontrolled , add singulair   Allergic rhinitis Uncontrolled, continued post nasal drip and cough, add astelin   Anemia, iron deficiency Iron  deficiency on supplement, exam at todays's visit shows heme positive stool which is new, will alert both ehr oncologist and GI Docs so follow up is coordinated  Anxiety Controlled, no change in medication   Essential hypertension Controlled, no change in medication   GERD (gastroesophageal reflux disease) Controlled, no change in medication   Pancreatic cancer metastasized to liver currently being treated and tolerating treatment well with good response   Heme positive stool Will alert pt's GI Doc and also her oncologist so that appropriate follow up is coordinated

## 2014-12-23 ENCOUNTER — Encounter (HOSPITAL_COMMUNITY): Payer: Self-pay | Admitting: *Deleted

## 2014-12-23 ENCOUNTER — Emergency Department (HOSPITAL_COMMUNITY)
Admission: EM | Admit: 2014-12-23 | Discharge: 2014-12-23 | Disposition: A | Discharge status: Home / Self Care | Payer: Medicare Other | Attending: Emergency Medicine | Admitting: Emergency Medicine

## 2014-12-23 DIAGNOSIS — H00035 Abscess of left lower eyelid: Secondary | ICD-10-CM | POA: Diagnosis not present

## 2014-12-23 DIAGNOSIS — Z87891 Personal history of nicotine dependence: Secondary | ICD-10-CM | POA: Diagnosis not present

## 2014-12-23 DIAGNOSIS — K219 Gastro-esophageal reflux disease without esophagitis: Secondary | ICD-10-CM | POA: Diagnosis not present

## 2014-12-23 DIAGNOSIS — H00036 Abscess of eyelid left eye, unspecified eyelid: Secondary | ICD-10-CM

## 2014-12-23 DIAGNOSIS — Z79899 Other long term (current) drug therapy: Secondary | ICD-10-CM | POA: Insufficient documentation

## 2014-12-23 DIAGNOSIS — Z7952 Long term (current) use of systemic steroids: Secondary | ICD-10-CM | POA: Diagnosis not present

## 2014-12-23 DIAGNOSIS — Z880 Allergy status to penicillin: Secondary | ICD-10-CM | POA: Insufficient documentation

## 2014-12-23 DIAGNOSIS — Z86018 Personal history of other benign neoplasm: Secondary | ICD-10-CM | POA: Diagnosis not present

## 2014-12-23 DIAGNOSIS — Z87442 Personal history of urinary calculi: Secondary | ICD-10-CM | POA: Diagnosis not present

## 2014-12-23 DIAGNOSIS — F419 Anxiety disorder, unspecified: Secondary | ICD-10-CM | POA: Insufficient documentation

## 2014-12-23 DIAGNOSIS — H02845 Edema of left lower eyelid: Secondary | ICD-10-CM | POA: Diagnosis present

## 2014-12-23 DIAGNOSIS — R030 Elevated blood-pressure reading, without diagnosis of hypertension: Secondary | ICD-10-CM | POA: Diagnosis not present

## 2014-12-23 DIAGNOSIS — Z7982 Long term (current) use of aspirin: Secondary | ICD-10-CM | POA: Insufficient documentation

## 2014-12-23 DIAGNOSIS — Z7951 Long term (current) use of inhaled steroids: Secondary | ICD-10-CM | POA: Insufficient documentation

## 2014-12-23 DIAGNOSIS — Z8507 Personal history of malignant neoplasm of pancreas: Secondary | ICD-10-CM | POA: Diagnosis not present

## 2014-12-23 DIAGNOSIS — I10 Essential (primary) hypertension: Secondary | ICD-10-CM | POA: Insufficient documentation

## 2014-12-23 NOTE — Discharge Instructions (Signed)
Hold a warm damp towel over your the swollen area underneath her left lower eyelid 4 times daily for 30 minutes at a time. Return if your condition worsens for any reason or see your eye specialist. Get your blood pressure recheck within the next 3 weeks. Today's was mildly elevated at 154/103

## 2014-12-23 NOTE — ED Provider Notes (Addendum)
CSN: [id number]     Arrival date & time 12/23/14  1407 History   None    Chief Complaint  Patient presents with  . Eye Problem     (Consider location/radiation/quality/duration/timing/severity/associated sxs/prior Treatment) HPI  Patient noted a swollen area immediately inferior to left lower eyelid last night. Today that swollen area"busted" and "water" came out. She complains of minimal pain at the area. No other associated symptoms no visual changes no pain on extraocular movement no fever no treatment prior to coming here. No other associated symptoms. Past Medical History  Diagnosis Date  . Hypertension 20 years   . Chronic diarrhea   . Anxiety   . Depression   . Kidney stone     hx/ crushed   . Hx of adenomatous colonic polyps     tubular adenomas, last found in 2008  . GERD (gastroesophageal reflux disease)   . Erosive esophagitis   . Schatzki's ring 08/26/10    Last dilated on EGD by Dr. Trevor Iha [facility], linear gastric erosions, BI hemigastrectomy  . Hyperplastic colon polyp 03/19/10    tcs by Dr. Gala Romney  . Pancreatic cancer 02/2014  . Pancreatic cancer metastasized to liver 03/10/2014   Past Surgical History  Procedure Laterality Date  . Stomach ulcer  50 years ago     had some of her stomach removed   . Bunion removal      from both feet   . Left shoulder surgery  2009    DR HARRISON  . Colonoscopy  03/19/2010    DR Gala Romney,, normal TI, pancolonic diverticula, random colon bx neg., hyperplastic polyps removed  . Esophagogastroduodenoscopy  11/22/2003    DR Gala Romney, erosive RE, Billroth I  . Esophagogastroduodenoscopy  08/26/10    Dr. Gala Romney- moderate severe ERE, Scahtzki ring s/p dilation, Billroth I, linear gastric erosions, bx-gastric xanthelasma  . Breast lumpectomy Left   . Orif ankle fracture Right 05/22/2013    Procedure: OPEN REDUCTION INTERNAL FIXATION (ORIF) RIGHT ANKLE FRACTURE;  Surgeon: Sanjuana Kava, MD;  Location: [facility];  Service: Orthopedics;  Laterality:  Right;  . Cholecystectomy  1965    Family History  Problem Relation Age of Onset  . Hypertension Mother   . Kidney failure Brother     X1 ON DIALYSIS  . Diabetes Sister   . Cancer Sister     X2  . Hypertension Father   . Liver disease Neg Hx   . Colon cancer Neg Hx    Social History  Substance Use Topics  . Smoking status: Former Smoker    Types: Cigarettes    Quit date: 11/09/2011  . Smokeless tobacco: Never Used     Comment: quit a few weeks ago   . Alcohol Use: No   OB History    No data available     Review of Systems  Skin: Positive for wound.       Swollen area inferior to left lower eyelid      Allergies  Iohexol; Aciphex; Amlodipine besylate-valsartan; Esomeprazole magnesium; Omeprazole; Penicillins; and Ciprofloxacin  Home Medications   Prior to Admission medications   Medication Sig Start Date End Date Taking? Authorizing Provider  aspirin 81 MG EC tablet Take 81 mg by mouth every morning.     Historical Provider, MD  azelastine (ASTELIN) 0.1 % nasal spray Place 2 sprays into both nostrils 2 (two) times daily. Use in each nostril as directed 12/12/14   Fayrene Helper, MD  AZOR 10-40 MG per tablet TAKE  1 TABLET DAILY 12/13/14   Fayrene Helper, MD  benzonatate (TESSALON) 100 MG capsule Take 1 capsule (100 mg total) by mouth 2 (two) times daily as needed for cough. 12/12/14   Fayrene Helper, MD  cetirizine (ZYRTEC) 10 MG tablet Take 1 tablet (10 mg total) by mouth every morning. 05/03/14   Fayrene Helper, MD  cholestyramine light (PREVALITE) 4 GM/DOSE powder TAKE 1 SCOOP BY MOUTH DAILY 11/14/14   Fayrene Helper, MD  clonazePAM (KLONOPIN) 0.5 MG tablet Take 1 tablet (0.5 mg total) by mouth 2 (two) times daily as needed for anxiety. 12/12/14   Fayrene Helper, MD  cycloSPORINE (RESTASIS) 0.05 % ophthalmic emulsion Place 1 drop into both eyes 2 (two) times daily.    Historical Provider, MD  diclofenac sodium (VOLTAREN) 1 % GEL Apply  daily, as  needed, to lower back, for uncontrolled pain Patient taking differently: Apply 2-4 g topically daily as needed (lower bck for uncontrolled pain.).  02/16/14   Fayrene Helper, MD  ferrous sulfate 325 (65 FE) MG tablet Take 325 mg by mouth daily with breakfast.    Historical Provider, MD  fluticasone (FLONASE) 50 MCG/ACT nasal spray Place 2 sprays into both nostrils daily.    Historical Provider, MD  HYDROcodone-acetaminophen (NORCO) 7.5-325 MG per tablet Take 1 tablet by mouth every 4 (four) hours as needed for moderate pain. 05/03/14   Patrici Ranks, MD  lidocaine-prilocaine (EMLA) cream Apply a quarter size amount to port site 1 hour prior to chemo. Do not rub in. Cover with plastic wrap. 08/09/14   Patrici Ranks, MD  montelukast (SINGULAIR) 10 MG tablet Take 1 tablet (10 mg total) by mouth at bedtime. 12/12/14   Fayrene Helper, MD  nebivolol (BYSTOLIC) 10 MG tablet Take 1 tablet (10 mg total) by mouth daily. 12/12/14   Fayrene Helper, MD  OVER THE COUNTER MEDICATION     Historical Provider, MD  pantoprazole (PROTONIX) 20 MG tablet Take 1 tablet (20 mg total) by mouth daily. 09/11/14   Fayrene Helper, MD  predniSONE (DELTASONE) 5 MG tablet Take 1 tablet (5 mg total) by mouth daily with breakfast. 10/04/14   Patrici Ranks, MD  Probiotic Product (HEALTHY COLON PO) Take 1 tablet by mouth every morning.     Historical Provider, MD  prochlorperazine (COMPAZINE) 10 MG tablet Take 1 tablet (10 mg total) by mouth every 6 (six) hours as needed for nausea or vomiting. 03/13/14   Farrel Gobble, MD  senna (SENOKOT) 8.6 MG tablet Take 1 tablet by mouth daily.    Historical Provider, MD   BP 161/70 mmHg  Pulse 59  Temp(Src) 98 F (36.7 C) (Oral)  Resp 20  Ht 5\' 3"  (1.6 m)  Wt 155 lb (70.308 kg)  BMI 27.46 kg/m2  SpO2 100% Physical Exam  Constitutional: She is oriented to person, place, and time. She appears well-developed and well-nourished.  HENT:  Head: Normocephalic.  Right  Ear: External ear normal.  Left Ear: External ear normal.  Eyes: Conjunctivae are normal. Pupils are equal, round, and reactive to light.  2 mm circular area immediately inferior to Center of left lower eyelid,, slightly swollen, draining tiny amount of serous fluid. Does not involve lid margin Nontender. Conjunctiva normal no pain on Extraocular movement  Neck: Neck supple.  Cardiovascular: Normal rate.   Pulmonary/Chest: Effort normal.  Abdominal: She exhibits no distension.  Musculoskeletal: Normal range of motion.  Neurological: She is oriented to person,  place, and time.  Nursing note and vitals reviewed.   ED Course  Procedures (including critical care time) Labs Review Labs Reviewed - No data to display  Imaging Review No results found. I, Orlie Dakin, personally reviewed and evaluated these images and lab results as part of my medical decision-making.   EKG Interpretation None      MDM  Suspect tiny abscess versus sebaceous cyst, which has ruptured spontaneously. No further treatment necessary other than warm compresses. Blood pressure recheck Final diagnoses:  None   diagnosis #1abscess of left eyelid  #2 elevated blood pressure      Orlie Dakin, MD 12/23/14 [place], MD 12/23/14 1525

## 2014-12-23 NOTE — ED Notes (Signed)
Pt states blister noticed under left eye yesterday and that it popped during the night. States draining from left eye since waking up along with pain under the eye. Pt states she is currently being tx for pancreatic and liver CA. Next chemotherapy tx is due Wednesday. NAD

## 2014-12-26 ENCOUNTER — Other Ambulatory Visit: Payer: Self-pay | Admitting: Family Medicine

## 2014-12-27 ENCOUNTER — Encounter (HOSPITAL_BASED_OUTPATIENT_CLINIC_OR_DEPARTMENT_OTHER): Payer: Medicare Other | Admitting: Hematology & Oncology

## 2014-12-27 ENCOUNTER — Encounter (HOSPITAL_COMMUNITY): Payer: Self-pay | Admitting: Hematology & Oncology

## 2014-12-27 ENCOUNTER — Encounter (HOSPITAL_COMMUNITY): Payer: Medicare Other | Attending: Hematology and Oncology

## 2014-12-27 VITALS — BP 172/76 | HR 60 | Temp 97.9°F | Resp 18

## 2014-12-27 VITALS — BP 146/77 | HR 57 | Temp 97.9°F | Resp 18 | Wt 151.8 lb

## 2014-12-27 DIAGNOSIS — C259 Malignant neoplasm of pancreas, unspecified: Secondary | ICD-10-CM | POA: Insufficient documentation

## 2014-12-27 DIAGNOSIS — Z5111 Encounter for antineoplastic chemotherapy: Secondary | ICD-10-CM | POA: Diagnosis present

## 2014-12-27 DIAGNOSIS — C252 Malignant neoplasm of tail of pancreas: Secondary | ICD-10-CM | POA: Insufficient documentation

## 2014-12-27 DIAGNOSIS — C787 Secondary malignant neoplasm of liver and intrahepatic bile duct: Secondary | ICD-10-CM

## 2014-12-27 DIAGNOSIS — C801 Malignant (primary) neoplasm, unspecified: Secondary | ICD-10-CM | POA: Insufficient documentation

## 2014-12-27 LAB — COMPREHENSIVE METABOLIC PANEL
ALBUMIN: 3.7 g/dL (ref 3.5–5.0)
ALT: 15 U/L (ref 14–54)
AST: 20 U/L (ref 15–41)
Alkaline Phosphatase: 76 U/L (ref 38–126)
Anion gap: 7 (ref 5–15)
BUN: 12 mg/dL (ref 6–20)
CHLORIDE: 107 mmol/L (ref 101–111)
CO2: 27 mmol/L (ref 22–32)
Calcium: 8.6 mg/dL — ABNORMAL LOW (ref 8.9–10.3)
Creatinine, Ser: 0.97 mg/dL (ref 0.44–1.00)
GFR calc Af Amer: >60 mL/min (ref >60)
GFR, EST NON AFRICAN AMERICAN: 52 mL/min — AB (ref >60)
GLUCOSE: 95 mg/dL (ref 65–99)
POTASSIUM: 3.4 mmol/L — AB (ref 3.5–5.1)
SODIUM: 141 mmol/L (ref 135–145)
Total Bilirubin: 0.7 mg/dL (ref 0.3–1.2)
Total Protein: 6.9 g/dL (ref 6.5–8.1)

## 2014-12-27 LAB — CBC WITH DIFFERENTIAL/PLATELET
BASOS ABS: 0 10*3/uL (ref 0.0–0.1)
BASOS PCT: 0 % (ref 0–1)
EOS ABS: 0.1 10*3/uL (ref 0.0–0.7)
EOS PCT: 1 % (ref 0–5)
HCT: 30.8 % — ABNORMAL LOW (ref 36.0–46.0)
Hemoglobin: 9.8 g/dL — ABNORMAL LOW (ref 12.0–15.0)
Lymphocytes Relative: 20 % (ref 12–46)
Lymphs Abs: 1.4 10*3/uL (ref 0.7–4.0)
MCH: 26.8 pg (ref 26.0–34.0)
MCHC: 31.8 g/dL (ref 30.0–36.0)
MCV: 84.2 fL (ref 78.0–100.0)
MONO ABS: 0.6 10*3/uL (ref 0.1–1.0)
Monocytes Relative: 8 % (ref 3–12)
Neutro Abs: 5.1 10*3/uL (ref 1.7–7.7)
Neutrophils Relative %: 71 % (ref 43–77)
PLATELETS: 189 10*3/uL (ref 150–400)
RBC: 3.66 MIL/uL — AB (ref 3.87–5.11)
RDW: 19.5 % — AB (ref 11.5–15.5)
WBC: 7.2 10*3/uL (ref 4.0–10.5)

## 2014-12-27 MED ORDER — SODIUM CHLORIDE 0.9 % IV SOLN
Freq: Once | INTRAVENOUS | Status: AC
  Administered 2014-12-27: 8 mg via INTRAVENOUS
  Filled 2014-12-27: qty 4

## 2014-12-27 MED ORDER — POTASSIUM CHLORIDE ER 10 MEQ PO CPCR: 10.0000 meq | ORAL_CAPSULE | Freq: Two times a day (BID) | ORAL | Status: DC

## 2014-12-27 MED ORDER — HEPARIN SOD (PORK) LOCK FLUSH 100 UNIT/ML IV SOLN
INTRAVENOUS | Status: AC
  Filled 2014-12-27: qty 5

## 2014-12-27 MED ORDER — DEXAMETHASONE SODIUM PHOSPHATE 10 MG/ML IJ SOLN: 10.0000 mg | Freq: Once | INTRAMUSCULAR | Status: DC

## 2014-12-27 MED ORDER — HYDROCODONE-ACETAMINOPHEN 5-325 MG PO TABS
ORAL_TABLET | ORAL | Status: AC
  Filled 2014-12-27: qty 1

## 2014-12-27 MED ORDER — PACLITAXEL PROTEIN-BOUND CHEMO INJECTION 100 MG
125.0000 mg/m2 | Freq: Once | INTRAVENOUS | Status: AC
  Administered 2014-12-27: 225 mg via INTRAVENOUS
  Filled 2014-12-27: qty 45

## 2014-12-27 MED ORDER — SODIUM CHLORIDE 0.9 % IV SOLN
1000.0000 mg/m2 | Freq: Once | INTRAVENOUS | Status: AC
  Administered 2014-12-27: 1786 mg via INTRAVENOUS
  Filled 2014-12-27: qty 46.97

## 2014-12-27 MED ORDER — SODIUM CHLORIDE 0.9 % IV SOLN
Freq: Once | INTRAVENOUS | Status: AC
  Administered 2014-12-27: 11:00:00 via INTRAVENOUS

## 2014-12-27 MED ORDER — SODIUM CHLORIDE 0.9 % IV SOLN: 8.0000 mg | Freq: Once | INTRAVENOUS | Status: DC

## 2014-12-27 MED ORDER — SODIUM CHLORIDE 0.9 % IJ SOLN
10.0000 mL | INTRAMUSCULAR | Status: DC | PRN
  Administered 2014-12-27: 10 mL
  Filled 2014-12-27: qty 10

## 2014-12-27 MED ORDER — HYDROCODONE-ACETAMINOPHEN 5-325 MG PO TABS
1.0000 | ORAL_TABLET | Freq: Once | ORAL | Status: AC
  Administered 2014-12-27: 1 via ORAL

## 2014-12-27 MED ORDER — HEPARIN SOD (PORK) LOCK FLUSH 100 UNIT/ML IV SOLN
500.0000 [IU] | Freq: Once | INTRAVENOUS | Status: AC | PRN
  Administered 2014-12-27: 500 [IU]

## 2014-12-27 NOTE — Patient Instructions (Signed)
[place] at [facility] Discharge Instructions  RECOMMENDATIONS MADE BY THE CONSULTANT AND ANY TEST RESULTS WILL BE SENT TO YOUR REFERRING PHYSICIAN.  Exam/discussion per Dr.Penland. Chemotherapy as planned today. A Shahrzad Koble prescription for potassium was sent to your pharmacy. Take as directed. MD appointment for follow-up after scans.   Thank you for choosing [place] at [facility] to provide your oncology and hematology care.  To afford each patient quality time with our provider, please arrive at least 15 minutes before your scheduled appointment time.    You need to re-schedule your appointment should you arrive 10 or more minutes late.  We strive to give you quality time with our providers, and arriving late affects you and other patients whose appointments are after yours.  Also, if you no show three or more times for appointments you may be dismissed from the clinic at the providers discretion.     Again, thank you for choosing [facility].  Our hope is that these requests will decrease the amount of time that you wait before being seen by our physicians.       _____________________________________________________________  Should you have questions after your visit to [facility], please contact our office at [phone] between the hours of 8:30 a.m. and 4:30 p.m.  Voicemails left after 4:30 p.m. will not be returned until the following business day.  For prescription refill requests, have your pharmacy contact our office.

## 2014-12-27 NOTE — Patient Instructions (Signed)
[facility] Discharge Instructions for Patients Receiving Chemotherapy  Today you received the following chemotherapy agents:  Abraxane and Gemzare  If you develop nausea and vomiting, or diarrhea that is not controlled by your medication, call the clinic.  The clinic phone number is [phone]. Office hours are Monday-Friday 8:30am-5:00pm.  BELOW ARE SYMPTOMS THAT SHOULD BE REPORTED IMMEDIATELY:  *FEVER GREATER THAN 101.0 F  *CHILLS WITH OR WITHOUT FEVER  NAUSEA AND VOMITING THAT IS NOT CONTROLLED WITH YOUR NAUSEA MEDICATION  *UNUSUAL SHORTNESS OF BREATH  *UNUSUAL BRUISING OR BLEEDING  TENDERNESS IN MOUTH AND THROAT WITH OR WITHOUT PRESENCE OF ULCERS  *URINARY PROBLEMS  *BOWEL PROBLEMS  UNUSUAL RASH Items with * indicate a potential emergency and should be followed up as soon as possible. If you have an emergency after office hours please contact your primary care physician or go to the nearest emergency department.  Please call the clinic during office hours if you have any questions or concerns.   You may also contact the Patient Navigator at [phone] should you have any questions or need assistance in obtaining follow up care. _____________________________________________________________________ Have you asked about our STAR program?    STAR stands for Survivorship Training and Rehabilitation, and this is a nationally recognized cancer care program that focuses on survivorship and rehabilitation.  Cancer and cancer treatments may cause problems, such as, pain, making you feel tired and keeping you from doing the things that you need or want to do. Cancer rehabilitation can help. Our goal is to reduce these troubling effects and help you have the best quality of life possible.  You may receive a survey from a nurse that asks questions about your current state of health.  Based on the survey results, all eligible patients will be referred to the [facility]  program for an evaluation so we can better serve you! A frequently asked questions sheet is available upon request.

## 2014-12-27 NOTE — Progress Notes (Signed)
1015:  Called to pt's room - c/o right leg "aching", rates pain 8/10, requesting something for pain.  Dr. Whitney Muse notified and orders received.   1315:  Tolerated tx w/o adverse reaction.  A&Ox4; VSS; discharged ambulatory.

## 2014-12-27 NOTE — Progress Notes (Signed)
[name] Nakayama, MD [address], [address] [place] [address]   DIAGNOSIS: Pancreatic cancer metastasized to liver   Staging form: Pancreas, AJCC 7th Edition     Clinical: Stage IV (T3, N1, M1) - Signed by Baird Cancer, PA-C on 03/16/2014     Pathologic: No stage assigned - Unsigned  CONTRAST MEDIA ALLERGY  SUMMARY OF ONCOLOGIC HISTORY:   Pancreatic cancer metastasized to liver   03/10/2014 Initial Diagnosis Pancreatic cancer metastasized to liver   03/22/2014 -  Chemotherapy Abraxane/Gemzar days 1, 8, every 28 days.  Day 15 was cancelled due to leukopenia and thrombocytopenia on day 15 cycle 1.   05/24/2014 Treatment Plan Change Day 8 of cycle 3 is held with ANC of 1.1   06/05/2014 Imaging CT C/A/P . Interval decrease in size of the pancreatic tail mass.Improved hepatic metastatic disease. No new lesions. No CT findings for metastatic disease involving the chest.   08/09/2014 Tumor Marker CA 19-9= 33 (WNL)   10/26/2014 Imaging MRI- Continued interval decrease in size of the hepatic metastatic lesions and no new lesions are identified. Continued decrease in size of the pancreatic tail lesion.   01/03/2015 Tumor Marker Results for [name], [name] (MRN [id number]) as of 01/18/2015 12:52  01/03/2015 10:00 CA 19-9: 14    01/17/2015 Imaging MRI- Response to therapy of hepatic metastasis.  Similar size of a pancreatic tail lesion.    CURRENT THERAPY: Gemzar/Abraxane  INTERVAL HISTORY: [name] 81 y.o. female returns for follow-up of her pancreatic cancer. She is doing fairly well. She notes that she attends church weekly.  The patient is here today with a female friend.  [name] says something around her left eye "busted" recently. She went to the ER, and they confirmed that it was only a cyst.  She says she is doing okay, but losing weight. She denies getting tired from treatment, or losing taste or appetite from treatment. She says she does not know why she's losing  weight. She has maintained the same level of physical activity and says she has not changed any of her eating habits. We reviewed her weights from her medical record and realistically they are fairly stable.  When asked about what she's doing every day, [name] says "not that much." She confirms that she gets up and gets dressed every day, and says she will go next door to visit her sister's house, and a few other places. She says she is not going out to eat at all and is not shopping at all. She goes out "once in a while," when she feels like it, and says she "just hasn't wanted to go shopping."   She says her son is coming home this week to stay for ten days. She laughs and states that her son does not expect her to cook for him while he visits, and that she goes out to eat with her son every day.  Her female friend says that she feels [name] is doing great.   MEDICAL HISTORY: Past Medical History  Diagnosis Date  . Hypertension 20 years   . Chronic diarrhea   . Anxiety   . Depression   . Kidney stone     hx/ crushed   . Hx of adenomatous colonic polyps     tubular adenomas, last found in 2008  . GERD (gastroesophageal reflux disease)   . Erosive esophagitis   . Schatzki's ring 08/26/10    Last dilated on EGD by  Dr. Trevor I[facility], linear gastric erosions, BI hemigastrectomy  . Hyperplastic colon polyp 03/19/10    tcs by Dr. Gala Romney  . Pancreatic cancer 02/2014  . Pancreatic cancer metastasized to liver 03/10/2014    has Essential hypertension; GERD (gastroesophageal reflux disease); Anxiety; Primary generalized (osteo)arthritis; Back pain with radiation; IGT (impaired glucose tolerance); Anemia, iron deficiency; Elevated LFTs; Change in stool habits; Abnormal liver ultrasound; Pancreatic cancer metastasized to liver; Allergic cough; Loose stools; Allergic rhinitis; Cough; and Heme positive stool on her problem list.     is allergic to iohexol; aciphex; amlodipine  besylate-valsartan; esomeprazole magnesium; omeprazole; penicillins; and ciprofloxacin.  [name] does not currently have medications on file.  SURGICAL HISTORY: Past Surgical History  Procedure Laterality Date  . Stomach ulcer  50 years ago     had some of her stomach removed   . Bunion removal      from both feet   . Left shoulder surgery  2009    DR HARRISON  . Colonoscopy  03/19/2010    DR Gala Romney,, normal TI, pancolonic diverticula, random colon bx neg., hyperplastic polyps removed  . Esophagogastroduodenoscopy  11/22/2003    DR Gala Romney, erosive RE, Billroth I  . Esophagogastroduodenoscopy  08/26/10    Dr. Gala Romney- moderate severe ERE, Scahtzki ring s/p dilation, Billroth I, linear gastric erosions, bx-gastric xanthelasma  . Breast lumpectomy Left   . Orif ankle fracture Right 05/22/2013    Procedure: OPEN REDUCTION INTERNAL FIXATION (ORIF) RIGHT ANKLE FRACTURE;  Surgeon: Sanjuana Kava, MD;  Location: [facility];  Service: Orthopedics;  Laterality: Right;  . Cholecystectomy  1965     SOCIAL HISTORY: Social History   Social History  . Marital Status: Widowed    Spouse Name: N/A  . Number of Children: 2  . Years of Education: N/A   Occupational History  . retired from Teacher, adult education    Social History Main Topics  . Smoking status: Former Smoker    Types: Cigarettes    Quit date: 11/09/2011  . Smokeless tobacco: Never Used     Comment: quit a few weeks ago   . Alcohol Use: No  . Drug Use: No  . Sexual Activity: No   Other Topics Concern  . Not on file   Social History Narrative    FAMILY HISTORY: Family History  Problem Relation Age of Onset  . Hypertension Mother   . Kidney failure Brother     X1 ON DIALYSIS  . Diabetes Sister   . Cancer Sister     X2  . Hypertension Father   . Liver disease Neg Hx   . Colon cancer Neg Hx     Review of Systems  Constitutional: Negative for malaise/fatigue. HENT: Negative.   Eyes: Negative for vision change, double vision,  photophobia, pain, discharge and redness.  Respiratory: Negative.   Cardiovascular: Negative.   Gastrointestinal: Negative.   Genitourinary: Negative.  Musculoskeletal: Positive for joint pain.  Skin: Negative.   Neurological: Negative for weakness, dizziness, tingling, tremors, sensory change, speech change, focal weakness, seizures and loss of consciousness. Endo/Heme/Allergies: Negative.   Psychiatric/Behavioral: Negative.   14 point review of systems was performed and is negative except as detailed under history of present illness and above   PHYSICAL EXAMINATION  ECOG PERFORMANCE STATUS: 0 - Asymptomatic  Filed Vitals:   12/27/14 0946  BP: 146/77  Pulse: 57  Temp: 97.9 F (36.6 C)  Resp: 18    Physical Exam  Constitutional: She is oriented to person, place, and time  and well-developed, well-nourished, and in no distress.  Wearing head wrap; well groomed. HENT:  Head: Normocephalic and atraumatic.  Nose: Nose normal.  Mouth/Throat: Oropharynx is clear and moist. No oropharyngeal exudate.  Eyes: Conjunctivae and EOM are normal. Pupils are equal, round, and reactive to light. Right eye exhibits no discharge. Left eye exhibits no discharge. No scleral icterus.  Neck: Normal range of motion. Neck supple. No tracheal deviation present. No thyromegaly present.  Cardiovascular: Normal rate, regular rhythm and normal heart sounds.  Exam reveals no gallop and no friction rub.   No murmur heard. Pulmonary/Chest: Effort normal and breath sounds normal. She has no wheezes. She has no rales.  Abdominal: Soft. Bowel sounds are normal. She exhibits no distension and no mass. There is no tenderness. There is no rebound and no guarding.  Musculoskeletal: Normal range of motion. She exhibits no edema.  Lymphadenopathy:    She has no cervical adenopathy.  Neurological: She is alert and oriented to person, place, and time. She has normal reflexes. No cranial nerve deficit. Gait normal.  Coordination normal.  Skin: Skin is warm and dry. No rash noted.  Psychiatric: Mood, memory, affect and judgment normal.  Nursing note and vitals reviewed.   LABORATORY DATA: Results for [name], [name] (MRN [id number])  Ref. Range 12/27/2014 09:33  Sodium Latest Ref Range: 135-145 mmol/L 141  Potassium Latest Ref Range: 3.5-5.1 mmol/L 3.4 (L)  Chloride Latest Ref Range: 101-111 mmol/L 107  CO2 Latest Ref Range: 22-32 mmol/L 27  BUN Latest Ref Range: 6-20 mg/dL 12  Creatinine Latest Ref Range: 0.44-1.00 mg/dL 0.97  Calcium Latest Ref Range: 8.9-10.3 mg/dL 8.6 (L)  EGFR (Non-African Amer.) Latest Ref Range: >60 mL/min 52 (L)  EGFR (African American) Latest Ref Range: >60 mL/min >60  Glucose Latest Ref Range: 65-99 mg/dL 95  Anion gap Latest Ref Range: 5-15  7  Alkaline Phosphatase Latest Ref Range: 38-126 U/L 76  Albumin Latest Ref Range: 3.5-5.0 g/dL 3.7  AST Latest Ref Range: 15-41 U/L 20  ALT Latest Ref Range: 14-54 U/L 15  Total Protein Latest Ref Range: 6.5-8.1 g/dL 6.9  Total Bilirubin Latest Ref Range: 0.3-1.2 mg/dL 0.7  WBC Latest Ref Range: 4.0-10.5 K/uL 7.2  RBC Latest Ref Range: 3.87-5.11 MIL/uL 3.66 (L)  Hemoglobin Latest Ref Range: 12.0-15.0 g/dL 9.8 (L)  HCT Latest Ref Range: 36.0-46.0 % 30.8 (L)  MCV Latest Ref Range: 78.0-100.0 fL 84.2  MCH Latest Ref Range: 26.0-34.0 pg 26.8  MCHC Latest Ref Range: 30.0-36.0 g/dL 31.8  RDW Latest Ref Range: 11.5-15.5 % 19.5 (H)  Platelets Latest Ref Range: 150-400 K/uL 189  Neutrophils Latest Ref Range: 43-77 % 71  Lymphocytes Latest Ref Range: 12-46 % 20  Monocytes Relative Latest Ref Range: 3-12 % 8  Eosinophil Latest Ref Range: 0-5 % 1  Basophil Latest Ref Range: 0-1 % 0  NEUT# Latest Ref Range: 1.7-7.7 K/uL 5.1  Lymphocyte # Latest Ref Range: 0.7-4.0 K/uL 1.4  Monocyte # Latest Ref Range: 0.1-1.0 K/uL 0.6  Eosinophils Absolute Latest Ref Range: 0.0-0.7 K/uL 0.1  Basophils Absolute Latest Ref Range: 0.0-0.1 K/uL 0.0  CA  19-9 Latest Ref Range: 0-35 U/mL 15    RADIOLOGY: CLINICAL DATA: Metastatic pancreatic cancer.  EXAM: MRI ABDOMEN WITHOUT AND WITH CONTRAST  TECHNIQUE: Multiplanar multisequence MR imaging of the abdomen was performed both before and after the administration of intravenous contrast.  CONTRAST: 18m MULTIHANCE GADOBENATE DIMEGLUMINE 529 MG/ML IV SOLN  COMPARISON: CT scan 07/19/2014 and MRI 03/02/2014  FINDINGS: Lower  chest: The lung bases are clear. No pulmonary lesions or pleural effusion. The heart is borderline enlarged but no pericardial effusion.  Hepatobiliary: The right hepatic lobe liver lesion near the caudate lobe measures 12 mm and previously measured 17 mm. The small peripheral lesion in the right hepatic lobe measures 5 mm and was previously 8 mm. No new hepatic lesions. Stable mild intra and extrahepatic biliary dilatation without obvious common bile duct stone.  Pancreas: No pancreatic head mass or acute inflammation. The pancreatic tail mass has significantly improved. It is difficult to identify on the postcontrast images. It measures approximately 25 x 19 mm and measured 53 x 30 mm on the prior MRI and 27 x 26 mm on the prior CT.  Spleen: Normal.  Adrenals/Urinary Tract: Stable renal cysts. No worrisome renal lesions. The adrenal glands are normal.  Stomach/Bowel: The stomach, duodenum, visualized small bowel and visualized colon are unremarkable.  Vascular/Lymphatic: No abdominal lymphadenopathy is identified. Stable atherosclerotic changes involving the aorta. The branch vessels are patent. The major venous structures are patent.  Other: No ascites or abdominal wall hernia.  Musculoskeletal: No significant bony findings.  IMPRESSION: 1. Continued interval decrease in size of the hepatic metastatic lesions and no new lesions are identified. 2. Continued decrease in size of the pancreatic tail lesion. 3. No adenopathy. 4.  Stable numerous small renal cysts. 5. Stable intra and extrahepatic biliary dilatation likely related to prior cholecystectomy.   Electronically Signed  By: Marijo Sanes M.D.  On: 10/26/2014 13:48   ASSESSMENT and THERAPY PLAN:   STAGE IV Pancreatic Cancer Ongoing response to therapy Mild chemotherapy induced neuopathy Anemia Hemoccult positive stool  81 year old female on Gemzar/Abraxane for stage IV pancreatic cancer. She is doing and has done remarkably well.  She would like to continue with therapy. Her performance status remains a 1. It is time for repeat imaging and she agrees.  She is developing mild neuropathy, currently it is not causing any interference with her ADLs. This will have to be closely monitored moving forward.  I will refill her prednisone.  I do not feel this is detrimental to her as she takes a low dose daily.   I reviewed her weights and it is stable. She does have an anemia that I feel is related to her malignancy and therapy. It is currently stable, given her Hemoccult-positive stool however this will have to be closely monitored. I do not currently feel it is necessary to push her towards a colonoscopy   All questions were answered. The patient knows to call the clinic with any problems, questions or concerns. We can certainly see the patient much sooner if necessary.  This document serves as a record of services personally performed by Ancil Linsey, MD. It was created on her behalf by Toni Amend, a trained medical scribe. The creation of this record is based on the scribe's personal observations and the provider's statements to them. This document has been checked and approved by the attending provider.  I have reviewed the above documentation for accuracy and completeness, and I agree with the above.  This note was signed electronically. Molli Hazard, MD 01/28/2015

## 2014-12-28 LAB — CANCER ANTIGEN 19-9: CA 19 9: 15 U/mL (ref 0–35)

## 2015-01-03 ENCOUNTER — Encounter (HOSPITAL_BASED_OUTPATIENT_CLINIC_OR_DEPARTMENT_OTHER): Payer: Medicare Other

## 2015-01-03 ENCOUNTER — Encounter (HOSPITAL_COMMUNITY): Payer: Self-pay

## 2015-01-03 DIAGNOSIS — C787 Secondary malignant neoplasm of liver and intrahepatic bile duct: Secondary | ICD-10-CM | POA: Diagnosis not present

## 2015-01-03 DIAGNOSIS — C259 Malignant neoplasm of pancreas, unspecified: Secondary | ICD-10-CM | POA: Diagnosis not present

## 2015-01-03 DIAGNOSIS — C252 Malignant neoplasm of tail of pancreas: Secondary | ICD-10-CM | POA: Diagnosis not present

## 2015-01-03 DIAGNOSIS — Z452 Encounter for adjustment and management of vascular access device: Secondary | ICD-10-CM | POA: Diagnosis present

## 2015-01-03 DIAGNOSIS — C801 Malignant (primary) neoplasm, unspecified: Secondary | ICD-10-CM | POA: Diagnosis not present

## 2015-01-03 LAB — CBC WITH DIFFERENTIAL/PLATELET
BASOS ABS: 0 10*3/uL (ref 0.0–0.1)
BASOS PCT: 0 % (ref 0–1)
EOS ABS: 0 10*3/uL (ref 0.0–0.7)
EOS PCT: 0 % (ref 0–5)
HCT: 28.2 % — ABNORMAL LOW (ref 36.0–46.0)
HEMOGLOBIN: 9.3 g/dL — AB (ref 12.0–15.0)
LYMPHS ABS: 0.8 10*3/uL (ref 0.7–4.0)
Lymphocytes Relative: 17 % (ref 12–46)
MCH: 27.4 pg (ref 26.0–34.0)
MCHC: 33 g/dL (ref 30.0–36.0)
MCV: 83.2 fL (ref 78.0–100.0)
Monocytes Absolute: 0.1 10*3/uL (ref 0.1–1.0)
Monocytes Relative: 3 % (ref 3–12)
NEUTROS PCT: 81 % — AB (ref 43–77)
Neutro Abs: 3.8 10*3/uL (ref 1.7–7.7)
PLATELETS: 83 10*3/uL — AB (ref 150–400)
RBC: 3.39 MIL/uL — AB (ref 3.87–5.11)
RDW: 19 % — ABNORMAL HIGH (ref 11.5–15.5)
WBC: 4.7 10*3/uL (ref 4.0–10.5)

## 2015-01-03 LAB — COMPREHENSIVE METABOLIC PANEL
ALK PHOS: 66 U/L (ref 38–126)
ALT: 16 U/L (ref 14–54)
AST: 20 U/L (ref 15–41)
Albumin: 3.4 g/dL — ABNORMAL LOW (ref 3.5–5.0)
Anion gap: 7 (ref 5–15)
BILIRUBIN TOTAL: 0.6 mg/dL (ref 0.3–1.2)
BUN: 15 mg/dL (ref 6–20)
CALCIUM: 8.5 mg/dL — AB (ref 8.9–10.3)
CO2: 26 mmol/L (ref 22–32)
CREATININE: 0.88 mg/dL (ref 0.44–1.00)
Chloride: 106 mmol/L (ref 101–111)
GFR, EST NON AFRICAN AMERICAN: 59 mL/min — AB (ref >60)
Glucose, Bld: 180 mg/dL — ABNORMAL HIGH (ref 65–99)
Potassium: 3.6 mmol/L (ref 3.5–5.1)
Sodium: 139 mmol/L (ref 135–145)
TOTAL PROTEIN: 6.7 g/dL (ref 6.5–8.1)

## 2015-01-03 MED ORDER — HEPARIN SOD (PORK) LOCK FLUSH 100 UNIT/ML IV SOLN
500.0000 [IU] | Freq: Once | INTRAVENOUS | Status: AC
  Administered 2015-01-03: 500 [IU] via INTRAVENOUS

## 2015-01-03 MED ORDER — HEPARIN SOD (PORK) LOCK FLUSH 100 UNIT/ML IV SOLN
INTRAVENOUS | Status: AC
  Filled 2015-01-03: qty 5

## 2015-01-03 MED ORDER — SODIUM CHLORIDE 0.9 % IJ SOLN
10.0000 mL | INTRAMUSCULAR | Status: DC | PRN
  Administered 2015-01-03: 10 mL via INTRAVENOUS
  Filled 2015-01-03: qty 10

## 2015-01-03 NOTE — Patient Instructions (Addendum)
[facility] Discharge Instructions for Patients Receiving Chemotherapy  We are holding your chemotherapy today bc your platelets are low 82,000 Return on 01/18/2015 to see the doctor Follow up as scheduled Please call the clinic if you have any questions or concerns   To help prevent nausea and vomiting after your treatment, we encourage you to take your nausea medication  If you develop nausea and vomiting, or diarrhea that is not controlled by your medication, call the clinic.  The clinic phone number is [phone]. Office hours are Monday-Friday 8:30am-5:00pm.  BELOW ARE SYMPTOMS THAT SHOULD BE REPORTED IMMEDIATELY:  *FEVER GREATER THAN 101.0 F  *CHILLS WITH OR WITHOUT FEVER  NAUSEA AND VOMITING THAT IS NOT CONTROLLED WITH YOUR NAUSEA MEDICATION  *UNUSUAL SHORTNESS OF BREATH  *UNUSUAL BRUISING OR BLEEDING  TENDERNESS IN MOUTH AND THROAT WITH OR WITHOUT PRESENCE OF ULCERS  *URINARY PROBLEMS  *BOWEL PROBLEMS  UNUSUAL RASH Items with * indicate a potential emergency and should be followed up as soon as possible. If you have an emergency after office hours please contact your primary care physician or go to the nearest emergency department.  Please call the clinic during office hours if you have any questions or concerns.   You may also contact the Patient Navigator at [phone] should you have any questions or need assistance in obtaining follow up care. _____________________________________________________________________ Have you asked about our STAR program?    STAR stands for Survivorship Training and Rehabilitation, and this is a nationally recognized cancer care program that focuses on survivorship and rehabilitation.  Cancer and cancer treatments may cause problems, such as, pain, making you feel tired and keeping you from doing the things that you need or want to do. Cancer rehabilitation can help. Our goal is to reduce these troubling effects and  help you have the best quality of life possible.  You may receive a survey from a nurse that asks questions about your current state of health.  Based on the survey results, all eligible patients will be referred to the [facility] program for an evaluation so we can better serve you! A frequently asked questions sheet is available upon request.

## 2015-01-03 NOTE — Progress Notes (Signed)
Platelets 82,000 dr Whitney Muse notified  Hold chemotherapy today

## 2015-01-04 ENCOUNTER — Telehealth: Payer: Self-pay | Admitting: *Deleted

## 2015-01-04 LAB — CANCER ANTIGEN 19-9: CA 19-9: 14 U/mL (ref 0–35)

## 2015-01-04 NOTE — Telephone Encounter (Signed)
Pt called stating Dr. Moshe Cipro sent her in something for dry cough, pt said she has took all of the medicine and her cough went away for a little while, pt said her cough has come back and she is requesting for medicine for it. [facility] in [place]

## 2015-01-05 NOTE — Telephone Encounter (Signed)
Attempted to reach patient multiple times.  No answer. Phone rings until fax sound alarms.

## 2015-01-09 ENCOUNTER — Other Ambulatory Visit: Payer: Self-pay

## 2015-01-09 ENCOUNTER — Telehealth: Payer: Self-pay

## 2015-01-09 DIAGNOSIS — R058 Other specified cough: Secondary | ICD-10-CM

## 2015-01-09 DIAGNOSIS — R05 Cough: Secondary | ICD-10-CM

## 2015-01-09 MED ORDER — BENZONATATE 100 MG PO CAPS: 100.0000 mg | ORAL_CAPSULE | Freq: Two times a day (BID) | ORAL | Status: DC | PRN

## 2015-01-09 NOTE — Telephone Encounter (Signed)
pls send pred 5 mg one twice daily for THREE days, oncology also aware of cough

## 2015-01-09 NOTE — Telephone Encounter (Signed)
Patient states that she would just prefer a refill on Tessalon Perles.  Refill sent.  Prednisone not ordered.

## 2015-01-16 ENCOUNTER — Encounter: Payer: Medicare Other | Admitting: Family Medicine

## 2015-01-17 ENCOUNTER — Other Ambulatory Visit (HOSPITAL_COMMUNITY): Payer: Self-pay | Admitting: Hematology & Oncology

## 2015-01-17 ENCOUNTER — Ambulatory Visit (HOSPITAL_COMMUNITY)
Admission: RE | Admit: 2015-01-17 | Discharge: 2015-01-17 | Disposition: A | Discharge status: Home / Self Care | Payer: Medicare Other | Source: Ambulatory Visit | Attending: Hematology & Oncology | Admitting: Hematology & Oncology

## 2015-01-17 DIAGNOSIS — C259 Malignant neoplasm of pancreas, unspecified: Secondary | ICD-10-CM | POA: Insufficient documentation

## 2015-01-17 DIAGNOSIS — N2889 Other specified disorders of kidney and ureter: Secondary | ICD-10-CM | POA: Insufficient documentation

## 2015-01-17 DIAGNOSIS — Z08 Encounter for follow-up examination after completed treatment for malignant neoplasm: Secondary | ICD-10-CM | POA: Diagnosis not present

## 2015-01-17 DIAGNOSIS — C787 Secondary malignant neoplasm of liver and intrahepatic bile duct: Secondary | ICD-10-CM | POA: Diagnosis not present

## 2015-01-17 MED ORDER — GADOBENATE DIMEGLUMINE 529 MG/ML IV SOLN
14.0000 mL | Freq: Once | INTRAVENOUS | Status: AC | PRN
  Administered 2015-01-17: 14 mL via INTRAVENOUS

## 2015-01-18 ENCOUNTER — Encounter (HOSPITAL_COMMUNITY): Payer: Medicare Other | Attending: Hematology and Oncology | Admitting: Oncology

## 2015-01-18 VITALS — BP 146/56 | HR 63 | Temp 97.9°F | Resp 20 | Wt 150.9 lb

## 2015-01-18 DIAGNOSIS — C787 Secondary malignant neoplasm of liver and intrahepatic bile duct: Secondary | ICD-10-CM

## 2015-01-18 DIAGNOSIS — C252 Malignant neoplasm of tail of pancreas: Secondary | ICD-10-CM | POA: Insufficient documentation

## 2015-01-18 DIAGNOSIS — C259 Malignant neoplasm of pancreas, unspecified: Secondary | ICD-10-CM | POA: Diagnosis not present

## 2015-01-18 DIAGNOSIS — C801 Malignant (primary) neoplasm, unspecified: Secondary | ICD-10-CM | POA: Insufficient documentation

## 2015-01-18 MED ORDER — HYDROCODONE-ACETAMINOPHEN 7.5-325 MG PO TABS: 1.0000 | ORAL_TABLET | ORAL | Status: DC | PRN

## 2015-01-18 NOTE — Patient Instructions (Addendum)
[place] at [facility] Discharge Instructions  RECOMMENDATIONS MADE BY THE CONSULTANT AND ANY TEST RESULTS WILL BE SENT TO YOUR REFERRING PHYSICIAN.  MRI showing signs of improvement  Return for chemotherapy next week  Return to see MD in October    Thank you for choosing [place] at [facility] to provide your oncology and hematology care.  To afford each patient quality time with our provider, please arrive at least 15 minutes before your scheduled appointment time.    You need to re-schedule your appointment should you arrive 10 or more minutes late.  We strive to give you quality time with our providers, and arriving late affects you and other patients whose appointments are after yours.  Also, if you no show three or more times for appointments you may be dismissed from the clinic at the providers discretion.     Again, thank you for choosing [facility].  Our hope is that these requests will decrease the amount of time that you wait before being seen by our physicians.       _____________________________________________________________  Should you have questions after your visit to [facility], please contact our office at [phone] between the hours of 8:30 a.m. and 4:30 p.m.  Voicemails left after 4:30 p.m. will not be returned until the following business day.  For prescription refill requests, have your pharmacy contact our office.

## 2015-01-18 NOTE — Assessment & Plan Note (Addendum)
Stage IV Pancreatic Cancer, on systemic therapy with Abraxane and Gemzar in a day 1, 8 fashion every 28 days.  I personally reviewed and went over radiographic studies with the patient.  The results are noted within this dictation.  Continued response to therapy is noted on imaging studies.  I reviewed the images in detail on the computer with the patient and we compared old imaging to the most recent to demonstrate response to therapy  She asks for a number of refills today.  Most of these are filled by her primary care provider.  She did ask for a refill on her pain medication which I obliged.  She does not use her pain medication often.  Return next week to embark on her next cycle of chemotherapy.  We will see her back for day 1 of her following cycle of chemotherapy.

## 2015-01-18 NOTE — Progress Notes (Signed)
[name], MD [address] [place] [address]  Pancreatic cancer metastasized to liver - Plan: HYDROcodone-acetaminophen (NORCO) 7.5-325 MG per tablet  CURRENT THERAPY: Gemzar/Abraxane  INTERVAL HISTORY: [name] 81 y.o. female returns for followup of Stage IV Pancreatic Cancer, on systemic therapy with Abraxane and Gemzar in a day 1, 8 fashion every 28 days.    Pancreatic cancer metastasized to liver   03/10/2014 Initial Diagnosis Pancreatic cancer metastasized to liver   03/22/2014 -  Chemotherapy Abraxane/Gemzar days 1, 8, every 28 days.  Day 15 was cancelled due to leukopenia and thrombocytopenia on day 15 cycle 1.   05/24/2014 Treatment Plan Change Day 8 of cycle 3 is held with ANC of 1.1   06/05/2014 Imaging CT C/A/P . Interval decrease in size of the pancreatic tail mass.Improved hepatic metastatic disease. No new lesions. No CT findings for metastatic disease involving the chest.   08/09/2014 Tumor Marker CA 19-9= 33 (WNL)   10/26/2014 Imaging MRI- Continued interval decrease in size of the hepatic metastatic lesions and no new lesions are identified. Continued decrease in size of the pancreatic tail lesion.   01/03/2015 Tumor Marker Results for INGE, WALDROUP (MRN [id number]) as of 01/18/2015 12:52  01/03/2015 10:00 CA 19-9: 14    01/17/2015 Imaging MRI- Response to therapy of hepatic metastasis.  Similar size of a pancreatic tail lesion.    I personally reviewed and went over laboratory results with the patient.  The results are noted within this dictation.  I personally reviewed and went over radiographic studies with the patient.  The results are noted within this dictation.  MRI of abdomen demonstrates continued response, particularly in hepatic metastases.Pancreatic mass is stable.  She denies any complaints.  Past Medical History  Diagnosis Date  . Hypertension 20 years   . Chronic diarrhea   . Anxiety   . Depression   . Kidney  stone     hx/ crushed   . Hx of adenomatous colonic polyps     tubular adenomas, last found in 2008  . GERD (gastroesophageal reflux disease)   . Erosive esophagitis   . Schatzki's ring 08/26/10    Last dilated on EGD by Dr. [facility], linear gastric erosions, BI hemigastrectomy  . Hyperplastic colon polyp 03/19/10    tcs by Dr. Gala Romney  . Pancreatic cancer 02/2014  . Pancreatic cancer metastasized to liver 03/10/2014    has Essential hypertension; GERD (gastroesophageal reflux disease); Anxiety; Primary generalized (osteo)arthritis; Back pain with radiation; IGT (impaired glucose tolerance); Anemia, iron deficiency; Elevated LFTs; Change in stool habits; Abnormal liver ultrasound; Pancreatic cancer metastasized to liver; Allergic cough; Loose stools; Allergic rhinitis; Cough; and Heme positive stool on her problem list.     is allergic to iohexol; aciphex; amlodipine besylate-valsartan; esomeprazole magnesium; omeprazole; penicillins; and ciprofloxacin.  Current Outpatient Prescriptions on File Prior to Visit  Medication Sig Dispense Refill  . aspirin 81 MG EC tablet Take 81 mg by mouth every morning.     Marland Kitchen azelastine (ASTELIN) 0.1 % nasal spray Place 2 sprays into both nostrils 2 (two) times daily. Use in each nostril as directed 30 mL 12  . AZOR 10-40 MG per tablet TAKE 1 TABLET DAILY 90 tablet 0  . benzonatate (TESSALON) 100 MG capsule Take 1 capsule (100 mg total) by mouth 2 (two) times daily as needed for cough. 20 capsule 0  . cetirizine (ZYRTEC) 10 MG tablet Take 1 tablet (10 mg total) by  mouth every morning. 30 tablet 3  . cholestyramine light (PREVALITE) 4 GM/DOSE powder TAKE 1 SCOOP BY MOUTH DAILY 239.4 g 0  . clonazePAM (KLONOPIN) 0.5 MG tablet Take 1 tablet (0.5 mg total) by mouth 2 (two) times daily as needed for anxiety. 60 tablet 3  . cycloSPORINE (RESTASIS) 0.05 % ophthalmic emulsion Place 1 drop into both eyes 2 (two) times daily.    . diclofenac sodium (VOLTAREN) 1 %  GEL Apply  daily, as needed, to lower back, for uncontrolled pain (Patient taking differently: Apply 2-4 g topically daily as needed (lower bck for uncontrolled pain.). ) 100 g 0  . ferrous sulfate 325 (65 FE) MG tablet Take 325 mg by mouth daily with breakfast.    . fluticasone (FLONASE) 50 MCG/ACT nasal spray Place 2 sprays into both nostrils daily.    Marland Kitchen lidocaine-prilocaine (EMLA) cream Apply a quarter size amount to port site 1 hour prior to chemo. Do not rub in. Cover with plastic wrap. 30 g 3  . nebivolol (BYSTOLIC) 10 MG tablet Take 1 tablet (10 mg total) by mouth daily. 90 tablet 1  . OVER THE COUNTER MEDICATION     . potassium chloride (MICRO-K) 10 MEQ CR capsule Take 1 capsule (10 mEq total) by mouth 2 (two) times daily. 60 capsule 1  . Probiotic Product (HEALTHY COLON PO) Take 1 tablet by mouth every morning.     . senna (SENOKOT) 8.6 MG tablet Take 1 tablet by mouth daily.    . montelukast (SINGULAIR) 10 MG tablet Take 1 tablet (10 mg total) by mouth at bedtime. (Patient not taking: Reported on 01/18/2015) 30 tablet 3  . pantoprazole (PROTONIX) 20 MG tablet Take 1 tablet (20 mg total) by mouth daily. (Patient not taking: Reported on 12/27/2014) 90 tablet 0  . predniSONE (DELTASONE) 5 MG tablet Take 1 tablet (5 mg total) by mouth daily with breakfast. (Patient not taking: Reported on 12/27/2014) 30 tablet 1  . prochlorperazine (COMPAZINE) 10 MG tablet Take 1 tablet (10 mg total) by mouth every 6 (six) hours as needed for nausea or vomiting. (Patient not taking: Reported on 12/27/2014) 60 tablet 2  . [DISCONTINUED] DiphenhydrAMINE HCl (ALKA-SELTZER PLUS ALLERGY PO) Take 1 tablet by mouth as needed.     No current facility-administered medications on file prior to visit.    Past Surgical History  Procedure Laterality Date  . Stomach ulcer  50 years ago     had some of her stomach removed   . Bunion removal      from both feet   . Left shoulder surgery  2009    DR HARRISON  . Colonoscopy   03/19/2010    DR Gala Romney,, normal TI, pancolonic diverticula, random colon bx neg., hyperplastic polyps removed  . Esophagogastroduodenoscopy  11/22/2003    DR Gala Romney, erosive RE, Billroth I  . Esophagogastroduodenoscopy  08/26/10    Dr. Gala Romney- moderate severe ERE, Scahtzki ring s/p dilation, Billroth I, linear gastric erosions, bx-gastric xanthelasma  . Breast lumpectomy Left   . Orif ankle fracture Right 05/22/2013    Procedure: OPEN REDUCTION INTERNAL FIXATION (ORIF) RIGHT ANKLE FRACTURE;  Surgeon: Sanjuana Kava, MD;  Location: [facility];  Service: Orthopedics;  Laterality: Right;  . Cholecystectomy  1965     Denies any headaches, dizziness, double vision, fevers, chills, night sweats, nausea, vomiting, diarrhea, constipation, chest pain, heart palpitations, shortness of breath, blood in stool, black tarry stool, urinary pain, urinary burning, urinary frequency, hematuria.   PHYSICAL EXAMINATION  ECOG PERFORMANCE STATUS: 0 - Asymptomatic  Filed Vitals:   01/18/15 1314  BP: 146/56  Pulse: 63  Temp: 97.9 F (36.6 C)  Resp: 20    GENERAL:alert, no distress, well nourished, well developed, comfortable, cooperative and smiling SKIN: skin color, texture, turgor are normal, no rashes or significant lesions HEAD: Normocephalic, No masses, lesions, tenderness or abnormalities EYES: normal, EOMI, Conjunctiva are pink and non-injected EARS: External ears normal OROPHARYNX:mucous membranes are moist  NECK: supple, trachea midline NEURO: alert & oriented x 3 with fluent speech, no focal motor/sensory deficits, gait normal    LABORATORY DATA: CBC    Component Value Date/Time   WBC 4.7 01/03/2015 1000   RBC 3.39* 01/03/2015 1000   HGB 9.3* 01/03/2015 1000   HCT 28.2* 01/03/2015 1000   PLT 83* 01/03/2015 1000   MCV 83.2 01/03/2015 1000   MCH 27.4 01/03/2015 1000   MCHC 33.0 01/03/2015 1000   RDW 19.0* 01/03/2015 1000   LYMPHSABS 0.8 01/03/2015 1000   MONOABS 0.1 01/03/2015 1000    EOSABS 0.0 01/03/2015 1000   BASOSABS 0.0 01/03/2015 1000      Chemistry      Component Value Date/Time   NA 139 01/03/2015 1000   K 3.6 01/03/2015 1000   CL 106 01/03/2015 1000   CO2 26 01/03/2015 1000   BUN 15 01/03/2015 1000   CREATININE 0.88 01/03/2015 1000   CREATININE 0.97 02/14/2014 0758      Component Value Date/Time   CALCIUM 8.5* 01/03/2015 1000   ALKPHOS 66 01/03/2015 1000   AST 20 01/03/2015 1000   ALT 16 01/03/2015 1000   BILITOT 0.6 01/03/2015 1000      Results for REYLYNN, VANALSTINE (MRN [id number]) as of 01/18/2015 12:52  Ref. Range 01/03/2015 10:00  CA 19-9 Latest Ref Range: 0-35 U/mL 14    RADIOGRAPHIC STUDIES:  Mr Abdomen W Wo Contrast  01/17/2015   CLINICAL DATA:  Three month follow-up of pancreatic cancer with liver metastasis.  EXAM: MRI ABDOMEN WITHOUT AND WITH CONTRAST  TECHNIQUE: Multiplanar multisequence MR imaging of the abdomen was performed both before and after the administration of intravenous contrast.  CONTRAST:  40mL MULTIHANCE GADOBENATE DIMEGLUMINE 529 MG/ML IV SOLN  COMPARISON:  10/26/2014  FINDINGS: Mild to moderate motion degradation.  Lower chest:  Cardiomegaly, without pericardial or pleural effusion.  Hepatobiliary: Vague T2 hyperintense lesion within the subcapsular anterior segment right liver lobe measures 6 mm on image 35 of series 11. Similar to on the prior exam (when remeasured).  More central right liver lobe hypo enhancing lesion measures 9 mm on image 32 of series 11. Compare 12 mm on the prior exam.  Multiple foci of arterial phase hyper enhancement are identified, likely perfusion anomalies.  Cholecystectomy. Similar mild intrahepatic duct dilatation. The common duct is not significantly changed, including at 10 mm on image 35 of series 3.  Pancreas: Pancreatic atrophy and borderline duct dilatation are unchanged and favored be within normal variation in this age group. Soft tissue fullness and heterogeneous T2 signal about the pancreatic  tail again identified. On the order of 2.2 x 1.7 cm on image 30 of series 3. Similar to on the prior exam (when remeasured).  Spleen: Normal in size, without focal abnormality.  Adrenals/Urinary Tract: Normal adrenal glands. Multiple bilateral renal lesions. The majority are simple cysts. Some lesions demonstrate complexity, as evidenced by T1 hyperintensity on precontrast series 5003. No convincing evidence of post-contrast enhancement. No hydronephrosis.  Stomach/Bowel: Proximal gastric underdistention. Normal abdominal bowel  loops.  Vascular/Lymphatic: Aortic and branch vessel atherosclerosis. No retroperitoneal or retrocrural adenopathy. No peripancreatic adenopathy.  Other: No ascites.  No evidence of omental or peritoneal disease.  Musculoskeletal: Discogenic edema at L2-3.  IMPRESSION: 1. Motion degraded exam. 2. Response to therapy of hepatic metastasis. 3. Similar size of a pancreatic tail lesion. 4. Similar intrahepatic duct dilatation after cholecystectomy. The common duct is upper normal sized. No obstructive stone or mass identified. 5. Probable perfusion anomalies throughout the liver. Recommend attention on follow-up. 6. Renal lesions, likely cysts and complex cysts. No evidence of dominant neoplasm.   Electronically Signed   By: Abigail Miyamoto M.D.   On: 01/17/2015 14:11   Mr 3d Recon At Scanner  01/17/2015   CLINICAL DATA:  Three month follow-up of pancreatic cancer with liver metastasis.  EXAM: MRI ABDOMEN WITHOUT AND WITH CONTRAST  TECHNIQUE: Multiplanar multisequence MR imaging of the abdomen was performed both before and after the administration of intravenous contrast.  CONTRAST:  27mL MULTIHANCE GADOBENATE DIMEGLUMINE 529 MG/ML IV SOLN  COMPARISON:  10/26/2014  FINDINGS: Mild to moderate motion degradation.  Lower chest:  Cardiomegaly, without pericardial or pleural effusion.  Hepatobiliary: Vague T2 hyperintense lesion within the subcapsular anterior segment right liver lobe measures 6 mm on  image 35 of series 11. Similar to on the prior exam (when remeasured).  More central right liver lobe hypo enhancing lesion measures 9 mm on image 32 of series 11. Compare 12 mm on the prior exam.  Multiple foci of arterial phase hyper enhancement are identified, likely perfusion anomalies.  Cholecystectomy. Similar mild intrahepatic duct dilatation. The common duct is not significantly changed, including at 10 mm on image 35 of series 3.  Pancreas: Pancreatic atrophy and borderline duct dilatation are unchanged and favored be within normal variation in this age group. Soft tissue fullness and heterogeneous T2 signal about the pancreatic tail again identified. On the order of 2.2 x 1.7 cm on image 30 of series 3. Similar to on the prior exam (when remeasured).  Spleen: Normal in size, without focal abnormality.  Adrenals/Urinary Tract: Normal adrenal glands. Multiple bilateral renal lesions. The majority are simple cysts. Some lesions demonstrate complexity, as evidenced by T1 hyperintensity on precontrast series 5003. No convincing evidence of post-contrast enhancement. No hydronephrosis.  Stomach/Bowel: Proximal gastric underdistention. Normal abdominal bowel loops.  Vascular/Lymphatic: Aortic and branch vessel atherosclerosis. No retroperitoneal or retrocrural adenopathy. No peripancreatic adenopathy.  Other: No ascites.  No evidence of omental or peritoneal disease.  Musculoskeletal: Discogenic edema at L2-3.  IMPRESSION: 1. Motion degraded exam. 2. Response to therapy of hepatic metastasis. 3. Similar size of a pancreatic tail lesion. 4. Similar intrahepatic duct dilatation after cholecystectomy. The common duct is upper normal sized. No obstructive stone or mass identified. 5. Probable perfusion anomalies throughout the liver. Recommend attention on follow-up. 6. Renal lesions, likely cysts and complex cysts. No evidence of dominant neoplasm.   Electronically Signed   By: Abigail Miyamoto M.D.   On: 01/17/2015  14:11      ASSESSMENT AND PLAN:  Pancreatic cancer metastasized to liver Stage IV Pancreatic Cancer, on systemic therapy with Abraxane and Gemzar in a day 1, 8 fashion every 28 days.  I personally reviewed and went over radiographic studies with the patient.  The results are noted within this dictation.  Continued response to therapy is noted on imaging studies.  I reviewed the images in detail on the computer with the patient and we compared old imaging to the most  recent to demonstrate response to therapy  She asks for a number of refills today.  Most of these are filled by her primary care provider.  She did ask for a refill on her pain medication which I obliged.  She does not use her pain medication often.  Return next week to embark on her next cycle of chemotherapy.  We will see her back for day 1 of her following cycle of chemotherapy.    THERAPY PLAN:  Continue plan as scheduled.  All questions were answered. The patient knows to call the clinic with any problems, questions or concerns. We can certainly see the patient much sooner if necessary.  Patient and plan discussed with Dr. Ancil Linsey and she is in agreement with the aforementioned.   This note is electronically signed by: Doy Mince 01/18/2015 5:10 PM

## 2015-01-24 ENCOUNTER — Encounter (HOSPITAL_BASED_OUTPATIENT_CLINIC_OR_DEPARTMENT_OTHER): Payer: Medicare Other

## 2015-01-24 ENCOUNTER — Encounter (HOSPITAL_COMMUNITY): Payer: Self-pay | Admitting: Hematology & Oncology

## 2015-01-24 ENCOUNTER — Encounter (HOSPITAL_COMMUNITY): Payer: Self-pay

## 2015-01-24 VITALS — BP 182/71 | HR 65 | Temp 98.0°F | Resp 16 | Wt 150.0 lb

## 2015-01-24 DIAGNOSIS — C787 Secondary malignant neoplasm of liver and intrahepatic bile duct: Secondary | ICD-10-CM

## 2015-01-24 DIAGNOSIS — C259 Malignant neoplasm of pancreas, unspecified: Secondary | ICD-10-CM | POA: Diagnosis not present

## 2015-01-24 DIAGNOSIS — R530 Neoplastic (malignant) related fatigue: Secondary | ICD-10-CM

## 2015-01-24 DIAGNOSIS — C252 Malignant neoplasm of tail of pancreas: Secondary | ICD-10-CM | POA: Diagnosis not present

## 2015-01-24 DIAGNOSIS — Z5111 Encounter for antineoplastic chemotherapy: Secondary | ICD-10-CM

## 2015-01-24 DIAGNOSIS — C801 Malignant (primary) neoplasm, unspecified: Secondary | ICD-10-CM | POA: Diagnosis not present

## 2015-01-24 LAB — CBC WITH DIFFERENTIAL/PLATELET
BASOS ABS: 0 10*3/uL (ref 0.0–0.1)
BASOS PCT: 0 %
EOS PCT: 2 %
Eosinophils Absolute: 0.1 10*3/uL (ref 0.0–0.7)
HCT: 34.2 % — ABNORMAL LOW (ref 36.0–46.0)
Hemoglobin: 11.3 g/dL — ABNORMAL LOW (ref 12.0–15.0)
LYMPHS PCT: 20 %
Lymphs Abs: 1.3 10*3/uL (ref 0.7–4.0)
MCH: 28 pg (ref 26.0–34.0)
MCHC: 33 g/dL (ref 30.0–36.0)
MCV: 84.9 fL (ref 78.0–100.0)
MONO ABS: 0.6 10*3/uL (ref 0.1–1.0)
Monocytes Relative: 10 %
NEUTROS ABS: 4.5 10*3/uL (ref 1.7–7.7)
Neutrophils Relative %: 68 %
PLATELETS: 182 10*3/uL (ref 150–400)
RBC: 4.03 MIL/uL (ref 3.87–5.11)
RDW: 18.3 % — AB (ref 11.5–15.5)
WBC: 6.6 10*3/uL (ref 4.0–10.5)

## 2015-01-24 LAB — COMPREHENSIVE METABOLIC PANEL
ALBUMIN: 3.9 g/dL (ref 3.5–5.0)
ALT: 15 U/L (ref 14–54)
AST: 19 U/L (ref 15–41)
Alkaline Phosphatase: 84 U/L (ref 38–126)
Anion gap: 7 (ref 5–15)
BUN: 20 mg/dL (ref 6–20)
CHLORIDE: 107 mmol/L (ref 101–111)
CO2: 26 mmol/L (ref 22–32)
CREATININE: 1.03 mg/dL — AB (ref 0.44–1.00)
Calcium: 9 mg/dL (ref 8.9–10.3)
GFR calc Af Amer: 56 mL/min — ABNORMAL LOW (ref >60)
GFR calc non Af Amer: 49 mL/min — ABNORMAL LOW (ref >60)
GLUCOSE: 119 mg/dL — AB (ref 65–99)
POTASSIUM: 4 mmol/L (ref 3.5–5.1)
Sodium: 140 mmol/L (ref 135–145)
Total Bilirubin: 0.8 mg/dL (ref 0.3–1.2)
Total Protein: 7.1 g/dL (ref 6.5–8.1)

## 2015-01-24 MED ORDER — SODIUM CHLORIDE 0.9 % IV SOLN
Freq: Once | INTRAVENOUS | Status: AC
  Administered 2015-01-24: 11:00:00 via INTRAVENOUS

## 2015-01-24 MED ORDER — DEXAMETHASONE SODIUM PHOSPHATE 10 MG/ML IJ SOLN: 10.0000 mg | Freq: Once | INTRAMUSCULAR | Status: DC

## 2015-01-24 MED ORDER — SODIUM CHLORIDE 0.9 % IV SOLN
Freq: Once | INTRAVENOUS | Status: AC
  Administered 2015-01-24: 8 mg via INTRAVENOUS
  Filled 2015-01-24: qty 4

## 2015-01-24 MED ORDER — SODIUM CHLORIDE 0.9 % IV SOLN: 8.0000 mg | Freq: Once | INTRAVENOUS | Status: DC

## 2015-01-24 MED ORDER — HEPARIN SOD (PORK) LOCK FLUSH 100 UNIT/ML IV SOLN
500.0000 [IU] | Freq: Once | INTRAVENOUS | Status: AC | PRN
  Administered 2015-01-24: 500 [IU]

## 2015-01-24 MED ORDER — SODIUM CHLORIDE 0.9 % IJ SOLN
10.0000 mL | INTRAMUSCULAR | Status: DC | PRN
  Administered 2015-01-24: 10 mL
  Filled 2015-01-24: qty 10

## 2015-01-24 MED ORDER — SODIUM CHLORIDE 0.9 % IV SOLN
1000.0000 mg/m2 | Freq: Once | INTRAVENOUS | Status: AC
  Administered 2015-01-24: 1786 mg via INTRAVENOUS
  Filled 2015-01-24: qty 46.97

## 2015-01-24 MED ORDER — PREDNISONE 5 MG PO TABS: 5.0000 mg | ORAL_TABLET | Freq: Every day | ORAL | Status: DC

## 2015-01-24 MED ORDER — PACLITAXEL PROTEIN-BOUND CHEMO INJECTION 100 MG
125.0000 mg/m2 | Freq: Once | INTRAVENOUS | Status: AC
  Administered 2015-01-24: 225 mg via INTRAVENOUS
  Filled 2015-01-24: qty 45

## 2015-01-24 NOTE — Progress Notes (Signed)
Pt c/o lower to mid back pain onset upon waking 3 days ago; denies injury.  Denies pain at present "because I took a pain pill this morning". MRI from one week ago shows "discogenic edema"; T. Kefalas, PA notified; he will contact radiologist for further recommendation regarding this finding - per PA, this is synonymous with degenerative disc disease.  Instructed pt to continue with pain medication, and to let us know if she has any new or worsening symptoms.   1515:  Tolerated tx w/o adverse reaction.  A&ox4, in no distress.  Discharged ambulatory.

## 2015-01-24 NOTE — Patient Instructions (Signed)
[facility] Discharge Instructions for Patients Receiving Chemotherapy  Today you received the following chemotherapy agents:  Abraxane and Gemzar  If you develop nausea and vomiting, or diarrhea that is not controlled by your medication, call the clinic.  The clinic phone number is [phone]. Office hours are Monday-Friday 8:30am-5:00pm.  BELOW ARE SYMPTOMS THAT SHOULD BE REPORTED IMMEDIATELY:  *FEVER GREATER THAN 101.0 F  *CHILLS WITH OR WITHOUT FEVER  NAUSEA AND VOMITING THAT IS NOT CONTROLLED WITH YOUR NAUSEA MEDICATION  *UNUSUAL SHORTNESS OF BREATH  *UNUSUAL BRUISING OR BLEEDING  TENDERNESS IN MOUTH AND THROAT WITH OR WITHOUT PRESENCE OF ULCERS  *URINARY PROBLEMS  *BOWEL PROBLEMS  UNUSUAL RASH Items with * indicate a potential emergency and should be followed up as soon as possible. If you have an emergency after office hours please contact your primary care physician or go to the nearest emergency department.  Please call the clinic during office hours if you have any questions or concerns.   You may also contact the Patient Navigator at [phone] should you have any questions or need assistance in obtaining follow up care. _____________________________________________________________________ Have you asked about our STAR program?    STAR stands for Survivorship Training and Rehabilitation, and this is a nationally recognized cancer care program that focuses on survivorship and rehabilitation.  Cancer and cancer treatments may cause problems, such as, pain, making you feel tired and keeping you from doing the things that you need or want to do. Cancer rehabilitation can help. Our goal is to reduce these troubling effects and help you have the best quality of life possible.  You may receive a survey from a nurse that asks questions about your current state of health.  Based on the survey results, all eligible patients will be referred to the STAR  program for an evaluation so we can better serve you! A frequently asked questions sheet is available upon request.           

## 2015-01-28 ENCOUNTER — Encounter (HOSPITAL_COMMUNITY): Payer: Self-pay | Admitting: Hematology & Oncology

## 2015-01-29 ENCOUNTER — Ambulatory Visit (INDEPENDENT_AMBULATORY_CARE_PROVIDER_SITE_OTHER): Payer: Medicare Other | Admitting: Family Medicine

## 2015-01-29 ENCOUNTER — Encounter: Payer: Self-pay | Admitting: Family Medicine

## 2015-01-29 VITALS — BP 146/76 | HR 70 | Resp 18 | Ht 64.0 in | Wt 149.1 lb

## 2015-01-29 DIAGNOSIS — F419 Anxiety disorder, unspecified: Secondary | ICD-10-CM | POA: Diagnosis not present

## 2015-01-29 DIAGNOSIS — I10 Essential (primary) hypertension: Secondary | ICD-10-CM

## 2015-01-29 DIAGNOSIS — M549 Dorsalgia, unspecified: Secondary | ICD-10-CM

## 2015-01-29 MED ORDER — PANTOPRAZOLE SODIUM 20 MG PO TBEC: 20.0000 mg | DELAYED_RELEASE_TABLET | Freq: Every day | ORAL | Status: DC

## 2015-01-29 MED ORDER — TRAMADOL HCL 50 MG PO TABS: 50.0000 mg | ORAL_TABLET | Freq: Every evening | ORAL | Status: DC | PRN

## 2015-01-29 NOTE — Progress Notes (Signed)
   Subjective:    Patient ID: [name], female    DOB: 1929/04/02, 81 y.o.   MRN: [id number]  HPI    [name]     MRN: [id number]      DOB: 1928-07-16   HPI [name] is here with main c/o dizziness and ligth headedness recent onset. States her BP has also been elevated and thinks this may be contributing also. Recently prescribed hydrocodone for back pain, but she "feels it is too strong for her", same dose prescribed by oncology as the dose she had been on when she had a fractured ankle. Undoubtedly, i do believe that this is contributing to her symptoms and will change to tramadol ROS Denies recent fever or chills. Denies sinus pressure, nasal congestion, ear pain or sore throat. Denies chest congestion, productive cough or wheezing. Denies chest pains, palpitations and leg swelling Denies abdominal pain, nausea, vomiting,diarrhea or constipation.   Denies dysuria, frequency, hesitancy or incontinence. C/o  joint pain, swelling and limitation in mobility. Denies headaches, seizures, numbness, or tingling. Denies depression, anxiety or insomnia. Denies skin break down or rash.   PE  BP 146/76 mmHg  Pulse 70  Resp 18  Ht 5\' 4"  (1.626 m)  Wt 149 lb 1.9 oz (67.64 kg)  BMI 25.58 kg/m2  SpO2 96%  Patient alert and oriented and in no cardiopulmonary distress.  HEENT: No facial asymmetry, EOMI,   oropharynx pink and moist.  Neck supple no JVD, no mass.  Chest: Clear to auscultation bilaterally.  CVS: S1, S2 no murmurs, no S3.Regular rate.  ABD: Soft non tender.   Ext: No edema  MS: Adequate ROM spine, shoulders, hips and knees.  Skin: Intact, no ulcerations or rash noted.  Psych: Good eye contact, normal affect. Memory intact not anxious or depressed appearing.  CNS: CN 2-12 intact, power,  normal throughout.no focal deficits noted.   Assessment & Plan  Back pain with radiation Pt changed to tramadol as she is having excess adverse s/e on current  dose of hydrocodone, she will call back if dose she is started on is inadequater  Essential hypertension Uncontrolled , with elevated SBP, no change in med dose as already c/o dizziness and light heradedness for current pain medication, Re evaluate in 4 to 6 weeks DASH diet and commitment to daily physical activity for a minimum of 30 minutes discussed and encouraged, as a part of hypertension management. The importance of attaining a healthy weight is also discussed.  BP/Weight 01/31/2015 01/29/2015 01/24/2015 01/18/2015 01/17/2015 01/03/2015 07/10/6008  Systolic BP 932 355 732 202 - 542 706  Diastolic BP 71 76 71 56 - 60 77  Wt. (Lbs) 149.6 149.12 150 150.9 155 155 151.8  BMI 25.67 25.58 26.58 26.74 27.46 27.46 26.9        Anxiety Increased anxiety due to recent family stress, however pt non verbal about the issue, but clearly affected by the situation, will wait for her to open up about this and offer therapy if needed       Review of Systems     Objective:   Physical Exam        Assessment & Plan:

## 2015-01-29 NOTE — Patient Instructions (Signed)
Annual wellness 02/12/2015 as before  Call if you need me sooner  Dizziness and imbalance was from the hydrocodone  pain pill which I believe is too strong for you, do not take hydrocodone   Reduce salty food, blood pressure slightly elevated  Tramadol is prescribed for as needed use one at bedtime for uncontrolled back pain, thirty tablets expected to last 3 months  Check at oncology clinic about the flu vaccine please  Hope you feel better soon   Thanks for choosing [facility], we consider it a privelige to serve you.

## 2015-01-31 ENCOUNTER — Inpatient Hospital Stay (HOSPITAL_COMMUNITY): Payer: Medicare Other

## 2015-01-31 ENCOUNTER — Encounter (HOSPITAL_BASED_OUTPATIENT_CLINIC_OR_DEPARTMENT_OTHER): Payer: Medicare Other

## 2015-01-31 VITALS — BP 155/71 | HR 60 | Temp 97.7°F | Resp 18 | Wt 149.6 lb

## 2015-01-31 DIAGNOSIS — C787 Secondary malignant neoplasm of liver and intrahepatic bile duct: Secondary | ICD-10-CM | POA: Diagnosis not present

## 2015-01-31 DIAGNOSIS — C801 Malignant (primary) neoplasm, unspecified: Secondary | ICD-10-CM | POA: Diagnosis not present

## 2015-01-31 DIAGNOSIS — C252 Malignant neoplasm of tail of pancreas: Secondary | ICD-10-CM | POA: Diagnosis not present

## 2015-01-31 DIAGNOSIS — Z5111 Encounter for antineoplastic chemotherapy: Secondary | ICD-10-CM | POA: Diagnosis not present

## 2015-01-31 DIAGNOSIS — C259 Malignant neoplasm of pancreas, unspecified: Secondary | ICD-10-CM

## 2015-01-31 LAB — CBC WITH DIFFERENTIAL/PLATELET
BASOS PCT: 0 %
Basophils Absolute: 0 10*3/uL (ref 0.0–0.1)
EOS ABS: 0 10*3/uL (ref 0.0–0.7)
EOS PCT: 1 %
HCT: 31.8 % — ABNORMAL LOW (ref 36.0–46.0)
Hemoglobin: 10.3 g/dL — ABNORMAL LOW (ref 12.0–15.0)
Lymphocytes Relative: 32 %
Lymphs Abs: 0.9 10*3/uL (ref 0.7–4.0)
MCH: 27.4 pg (ref 26.0–34.0)
MCHC: 32.4 g/dL (ref 30.0–36.0)
MCV: 84.6 fL (ref 78.0–100.0)
MONO ABS: 0 10*3/uL — AB (ref 0.1–1.0)
MONOS PCT: 1 %
Neutro Abs: 1.9 10*3/uL (ref 1.7–7.7)
Neutrophils Relative %: 66 %
PLATELETS: 95 10*3/uL — AB (ref 150–400)
RBC: 3.76 MIL/uL — ABNORMAL LOW (ref 3.87–5.11)
RDW: 17.2 % — AB (ref 11.5–15.5)
WBC: 2.8 10*3/uL — ABNORMAL LOW (ref 4.0–10.5)

## 2015-01-31 LAB — COMPREHENSIVE METABOLIC PANEL
ALK PHOS: 80 U/L (ref 38–126)
ALT: 14 U/L (ref 14–54)
ANION GAP: 5 (ref 5–15)
AST: 19 U/L (ref 15–41)
Albumin: 3.9 g/dL (ref 3.5–5.0)
BUN: 18 mg/dL (ref 6–20)
CALCIUM: 8.6 mg/dL — AB (ref 8.9–10.3)
CHLORIDE: 108 mmol/L (ref 101–111)
CO2: 28 mmol/L (ref 22–32)
Creatinine, Ser: 1.08 mg/dL — ABNORMAL HIGH (ref 0.44–1.00)
GFR calc non Af Amer: 46 mL/min — ABNORMAL LOW (ref >60)
GFR, EST AFRICAN AMERICAN: 53 mL/min — AB (ref >60)
Glucose, Bld: 115 mg/dL — ABNORMAL HIGH (ref 65–99)
POTASSIUM: 4.2 mmol/L (ref 3.5–5.1)
SODIUM: 141 mmol/L (ref 135–145)
Total Bilirubin: 0.7 mg/dL (ref 0.3–1.2)
Total Protein: 7 g/dL (ref 6.5–8.1)

## 2015-01-31 MED ORDER — SODIUM CHLORIDE 0.9 % IV SOLN: 8.0000 mg | Freq: Once | INTRAVENOUS | Status: DC

## 2015-01-31 MED ORDER — HEPARIN SOD (PORK) LOCK FLUSH 100 UNIT/ML IV SOLN
500.0000 [IU] | Freq: Once | INTRAVENOUS | Status: AC | PRN
  Administered 2015-01-31: 500 [IU]

## 2015-01-31 MED ORDER — PACLITAXEL PROTEIN-BOUND CHEMO INJECTION 100 MG
125.0000 mg/m2 | Freq: Once | INTRAVENOUS | Status: AC
  Administered 2015-01-31: 225 mg via INTRAVENOUS
  Filled 2015-01-31: qty 45

## 2015-01-31 MED ORDER — SODIUM CHLORIDE 0.9 % IJ SOLN
10.0000 mL | INTRAMUSCULAR | Status: DC | PRN
  Administered 2015-01-31: 10 mL
  Filled 2015-01-31: qty 10

## 2015-01-31 MED ORDER — HEPARIN SOD (PORK) LOCK FLUSH 100 UNIT/ML IV SOLN
INTRAVENOUS | Status: AC
  Filled 2015-01-31: qty 5

## 2015-01-31 MED ORDER — SODIUM CHLORIDE 0.9 % IV SOLN
Freq: Once | INTRAVENOUS | Status: AC
  Administered 2015-01-31: 12:00:00 via INTRAVENOUS

## 2015-01-31 MED ORDER — DEXAMETHASONE SODIUM PHOSPHATE 10 MG/ML IJ SOLN: 10.0000 mg | Freq: Once | INTRAMUSCULAR | Status: DC

## 2015-01-31 MED ORDER — SODIUM CHLORIDE 0.9 % IV SOLN
Freq: Once | INTRAVENOUS | Status: AC
  Administered 2015-01-31: 8 mg via INTRAVENOUS
  Filled 2015-01-31: qty 4

## 2015-01-31 MED ORDER — GEMCITABINE HCL CHEMO INJECTION 1 GM/26.3ML
1000.0000 mg/m2 | Freq: Once | INTRAVENOUS | Status: AC
  Administered 2015-01-31: 1786 mg via INTRAVENOUS
  Filled 2015-01-31: qty 46.97

## 2015-01-31 NOTE — Progress Notes (Signed)
Tolerated chemo well. 

## 2015-01-31 NOTE — Patient Instructions (Signed)
[facility]cer Center Discharge Instructions for Patients Receiving Chemotherapy  Today you received the following chemotherapy agents Abraxane and Gemzar.  To help prevent nausea and vomiting after your treatment, we encourage you to take your nausea medication as instructed.   If you develop nausea and vomiting that is not controlled by your nausea medication, call the clinic. If it is after clinic hours your family physician or the after hours number for the clinic or go to the Emergency Department.  BELOW ARE SYMPTOMS THAT SHOULD BE REPORTED IMMEDIATELY:  *FEVER GREATER THAN 101.0 F  *CHILLS WITH OR WITHOUT FEVER  NAUSEA AND VOMITING THAT IS NOT CONTROLLED WITH YOUR NAUSEA MEDICATION  *UNUSUAL SHORTNESS OF BREATH  *UNUSUAL BRUISING OR BLEEDING  TENDERNESS IN MOUTH AND THROAT WITH OR WITHOUT PRESENCE OF ULCERS  *URINARY PROBLEMS  *BOWEL PROBLEMS  UNUSUAL RASH Items with * indicate a potential emergency and should be followed up as soon as possible.  Return as scheduled.  I have been informed and understand all the instructions given to me. I know to contact the clinic, my physician, or go to the Emergency Department if any problems should occur. I do not have any questions at this time, but understand that I may call the clinic during office hours or the Patient Navigator at [phone] should I have any questions or need assistance in obtaining follow up care.    __________________________________________  _____________  __________ Signature of Patient or Authorized Representative            Date                   Time    __________________________________________ Nurse's Signature  

## 2015-02-01 ENCOUNTER — Other Ambulatory Visit: Payer: Self-pay

## 2015-02-01 ENCOUNTER — Telehealth: Payer: Self-pay | Admitting: *Deleted

## 2015-02-01 LAB — CANCER ANTIGEN 19-9: CA 19 9: 13 U/mL (ref 0–35)

## 2015-02-01 MED ORDER — TRAMADOL HCL 50 MG PO TABS: 50.0000 mg | ORAL_TABLET | Freq: Two times a day (BID) | ORAL | Status: DC

## 2015-02-01 NOTE — Telephone Encounter (Signed)
Ms [name] called lmom stating she came in yesterday and Dr. Moshe Cipro gave her some medicine to take at bed time, Ms [name] would like to know if this medication is for pain, Ms [name] stated her back is still hurting.

## 2015-02-01 NOTE — Telephone Encounter (Signed)
pls call and let her know oK to take tramadol 50 mg one tablet twice daily if this keeps her pain controlled, and does not make her too sleepy, or nauseated , or light headed.  Pls send new  Let her know to call back with further concerns

## 2015-02-01 NOTE — Telephone Encounter (Signed)
Patient states the med you gave her for pain was only to be used at bedtime (as needed) 30 to last 3 months and she wakes up everyday in pain in her back/hips and doesn't have anything to take during the day. She said the medication she had before was the hydrocodone 7.5 but they were too strong but they helped. Wants to know if she can have a tramadol to take during the day as well. Please advise

## 2015-02-01 NOTE — Telephone Encounter (Signed)
DOSE CHANGED AND PATIENT AWARE

## 2015-02-03 NOTE — Assessment & Plan Note (Signed)
Pt changed to tramadol as she is having excess adverse s/e on current dose of hydrocodone, she will call back if dose she is started on is inadequater

## 2015-02-03 NOTE — Assessment & Plan Note (Signed)
Uncontrolled , with elevated SBP, no change in med dose as already c/o dizziness and light heradedness for current pain medication, Re evaluate in 4 to 6 weeks DASH diet and commitment to daily physical activity for a minimum of 30 minutes discussed and encouraged, as a part of hypertension management. The importance of attaining a healthy weight is also discussed.  BP/Weight 01/31/2015 01/29/2015 01/24/2015 01/18/2015 01/17/2015 01/03/2015 11/10/6376  Systolic BP 588 502 774 128 - 786 767  Diastolic BP 71 76 71 56 - 60 77  Wt. (Lbs) 149.6 149.12 150 150.9 155 155 151.8  BMI 25.67 25.58 26.58 26.74 27.46 27.46 26.9

## 2015-02-03 NOTE — Assessment & Plan Note (Signed)
Increased anxiety due to recent family stress, however pt non verbal about the issue, but clearly affected by the situation, will wait for her to open up about this and offer therapy if needed

## 2015-02-12 ENCOUNTER — Encounter: Payer: Self-pay | Admitting: *Deleted

## 2015-02-12 ENCOUNTER — Encounter: Payer: Medicare Other | Admitting: Family Medicine

## 2015-02-20 ENCOUNTER — Other Ambulatory Visit: Payer: Self-pay | Admitting: Family Medicine

## 2015-02-21 ENCOUNTER — Encounter (HOSPITAL_COMMUNITY): Payer: Medicare Other | Attending: Hematology and Oncology | Admitting: Oncology

## 2015-02-21 ENCOUNTER — Encounter (HOSPITAL_COMMUNITY): Payer: Self-pay | Admitting: Oncology

## 2015-02-21 ENCOUNTER — Ambulatory Visit (HOSPITAL_COMMUNITY): Payer: Medicare Other | Admitting: Oncology

## 2015-02-21 ENCOUNTER — Encounter (HOSPITAL_COMMUNITY): Payer: Medicare Other | Attending: Hematology & Oncology

## 2015-02-21 VITALS — BP 145/63 | HR 62 | Temp 97.8°F | Resp 18 | Wt 150.1 lb

## 2015-02-21 VITALS — BP 170/61 | HR 59 | Temp 97.9°F | Resp 16

## 2015-02-21 DIAGNOSIS — C252 Malignant neoplasm of tail of pancreas: Secondary | ICD-10-CM | POA: Insufficient documentation

## 2015-02-21 DIAGNOSIS — Z5111 Encounter for antineoplastic chemotherapy: Secondary | ICD-10-CM

## 2015-02-21 DIAGNOSIS — C787 Secondary malignant neoplasm of liver and intrahepatic bile duct: Secondary | ICD-10-CM | POA: Insufficient documentation

## 2015-02-21 DIAGNOSIS — Z0000 Encounter for general adult medical examination without abnormal findings: Secondary | ICD-10-CM

## 2015-02-21 DIAGNOSIS — Z23 Encounter for immunization: Secondary | ICD-10-CM

## 2015-02-21 DIAGNOSIS — C259 Malignant neoplasm of pancreas, unspecified: Secondary | ICD-10-CM | POA: Insufficient documentation

## 2015-02-21 DIAGNOSIS — C801 Malignant (primary) neoplasm, unspecified: Secondary | ICD-10-CM | POA: Insufficient documentation

## 2015-02-21 LAB — COMPREHENSIVE METABOLIC PANEL
ALBUMIN: 4.2 g/dL (ref 3.5–5.0)
ALK PHOS: 90 U/L (ref 38–126)
ALT: 17 U/L (ref 14–54)
AST: 22 U/L (ref 15–41)
Anion gap: 6 (ref 5–15)
BILIRUBIN TOTAL: 0.6 mg/dL (ref 0.3–1.2)
BUN: 19 mg/dL (ref 6–20)
CALCIUM: 9.1 mg/dL (ref 8.9–10.3)
CO2: 24 mmol/L (ref 22–32)
Chloride: 105 mmol/L (ref 101–111)
Creatinine, Ser: 1.07 mg/dL — ABNORMAL HIGH (ref 0.44–1.00)
GFR calc Af Amer: 54 mL/min — ABNORMAL LOW (ref >60)
GFR calc non Af Amer: 46 mL/min — ABNORMAL LOW (ref >60)
GLUCOSE: 139 mg/dL — AB (ref 65–99)
Potassium: 4.7 mmol/L (ref 3.5–5.1)
SODIUM: 135 mmol/L (ref 135–145)
TOTAL PROTEIN: 7.3 g/dL (ref 6.5–8.1)

## 2015-02-21 LAB — CBC WITH DIFFERENTIAL/PLATELET
BASOS ABS: 0 10*3/uL (ref 0.0–0.1)
BASOS PCT: 1 %
EOS ABS: 0 10*3/uL (ref 0.0–0.7)
Eosinophils Relative: 0 %
HEMATOCRIT: 34.4 % — AB (ref 36.0–46.0)
HEMOGLOBIN: 11.4 g/dL — AB (ref 12.0–15.0)
Lymphocytes Relative: 13 %
Lymphs Abs: 0.8 10*3/uL (ref 0.7–4.0)
MCH: 28.1 pg (ref 26.0–34.0)
MCHC: 33.1 g/dL (ref 30.0–36.0)
MCV: 84.7 fL (ref 78.0–100.0)
MONOS PCT: 3 %
Monocytes Absolute: 0.2 10*3/uL (ref 0.1–1.0)
NEUTROS ABS: 5.4 10*3/uL (ref 1.7–7.7)
NEUTROS PCT: 83 %
Platelets: 321 10*3/uL (ref 150–400)
RBC: 4.06 MIL/uL (ref 3.87–5.11)
RDW: 18.5 % — ABNORMAL HIGH (ref 11.5–15.5)
WBC: 6.5 10*3/uL (ref 4.0–10.5)

## 2015-02-21 MED ORDER — HEPARIN SOD (PORK) LOCK FLUSH 100 UNIT/ML IV SOLN
500.0000 [IU] | Freq: Once | INTRAVENOUS | Status: AC
  Administered 2015-02-21: 500 [IU] via INTRAVENOUS

## 2015-02-21 MED ORDER — GEMCITABINE HCL CHEMO INJECTION 1 GM/26.3ML
1000.0000 mg/m2 | Freq: Once | INTRAVENOUS | Status: AC
  Administered 2015-02-21: 1786 mg via INTRAVENOUS
  Filled 2015-02-21: qty 46.97

## 2015-02-21 MED ORDER — INFLUENZA VAC SPLIT QUAD 0.5 ML IM SUSY
0.5000 mL | PREFILLED_SYRINGE | Freq: Once | INTRAMUSCULAR | Status: AC
  Administered 2015-02-21: 0.5 mL via INTRAMUSCULAR
  Filled 2015-02-21: qty 0.5

## 2015-02-21 MED ORDER — SODIUM CHLORIDE 0.9 % IJ SOLN: 10.0000 mL | INTRAMUSCULAR | Status: DC | PRN

## 2015-02-21 MED ORDER — PACLITAXEL PROTEIN-BOUND CHEMO INJECTION 100 MG
125.0000 mg/m2 | Freq: Once | INTRAVENOUS | Status: AC
Start: 2015-02-21 — End: 2015-02-21
  Administered 2015-02-21: 225 mg via INTRAVENOUS
  Filled 2015-02-21: qty 45

## 2015-02-21 MED ORDER — SODIUM CHLORIDE 0.9 % IV SOLN
Freq: Once | INTRAVENOUS | Status: AC
  Administered 2015-02-21: 13:00:00 via INTRAVENOUS
  Filled 2015-02-21: qty 4

## 2015-02-21 MED ORDER — SODIUM CHLORIDE 0.9 % IV SOLN
INTRAVENOUS | Status: DC
  Administered 2015-02-21: 11:00:00 via INTRAVENOUS

## 2015-02-21 NOTE — Progress Notes (Signed)
Patient tolerated infusion well.  VSS post infusion.   

## 2015-02-21 NOTE — Progress Notes (Signed)
[name] presents today for injection per MD orders. Flu vaccine administered IM in left deltoid. Administration without incident. Patient tolerated well.

## 2015-02-21 NOTE — Assessment & Plan Note (Addendum)
Stage IV Pancreatic Cancer, on systemic therapy with Abraxane and Gemzar in a day 1, 8 fashion every 28 days.  Oncology history reviewed.  This is up to date.  She continues to tolerate and respond to treatment.  Her pain is well controlled on Tramadol BID.  This is prescribed by Dr. Moshe Cipro.  I have asked the patient to put her Hydrocodone on hold.  She is agreeable to this.  She has refills for Tramadol at [facility].  She notes a left popliteal discomfort intermittently.  Physical exam is negative for concerning findings for blood clot.  She is educated on signs and symptoms to be on the look out for and to inform us of issues such as heat, pain, swelling, and pain with walking.  Influenza vaccine administered today.  Return next week for day 8 of therapy.  Return in 4 weeks for follow-up.

## 2015-02-21 NOTE — Progress Notes (Signed)
[name], MD [address], [address] [place] [address]  Preventative health care - Plan: Influenza vac split quadrivalent PF (FLUARIX) injection 0.5 mL  Pancreatic cancer metastasized to liver ([place])  CURRENT THERAPY: S/P 12 cycles of Gemzar/Abraxane  INTERVAL HISTORY: [name] 81 y.o. female returns for followup of Stage IV Pancreatic Cancer, on systemic therapy with Abraxane and Gemzar in a day 1, 8 fashion every 28 days.     Pancreatic cancer metastasized to liver ([place])   03/10/2014 Initial Diagnosis Pancreatic cancer metastasized to liver   03/22/2014 -  Chemotherapy Abraxane/Gemzar days 1, 8, every 28 days.  Day 15 was cancelled due to leukopenia and thrombocytopenia on day 15 cycle 1.   05/24/2014 Treatment Plan Change Day 8 of cycle 3 is held with ANC of 1.1   06/05/2014 Imaging CT C/A/P . Interval decrease in size of the pancreatic tail mass.Improved hepatic metastatic disease. No new lesions. No CT findings for metastatic disease involving the chest.   08/09/2014 Tumor Marker CA 19-9= 33 (WNL)   10/26/2014 Imaging MRI- Continued interval decrease in size of the hepatic metastatic lesions and no new lesions are identified. Continued decrease in size of the pancreatic tail lesion.   01/03/2015 Tumor Marker Results for SAMMIE, SCHERMERHORN (MRN [id number]) as of 01/18/2015 12:52  01/03/2015 10:00 CA 19-9: 14    01/17/2015 Imaging MRI- Response to therapy of hepatic metastasis.  Similar size of a pancreatic tail lesion.   I personally reviewed and went over laboratory results with the patient.  The results are noted within this dictation.  She saw Dr. Moshe Cipro recently.  The patient noted increased back pain and reports that Hydrocodone 7.5 mg was "too powerful."  She reports after taking this medication, she was dizzy.  As a result, Dr. Moshe Cipro changed her pain medication Tramadol.  The patient reports that she tolerated this better with good pain relief.  She  reports that she was only allowed to take it at night, but her pain continued throughout the day.  As a result, Dr. Moshe Cipro altered the prescription to provide the patient the opportunity to take an additional tablet during the day.  This medication is available to the patient at [facility].  She notes a left popliteal pain that it is intermittent.  She denies any tenderness to palpation, unilateral LE edema, heat, or claudication.  Exam today is benign.   Past Medical History  Diagnosis Date  . Hypertension 20 years   . Chronic diarrhea   . Anxiety   . Depression   . Kidney stone     hx/ crushed   . Hx of adenomatous colonic polyps     tubular adenomas, last found in 2008  . GERD (gastroesophageal reflux disease)   . Erosive esophagitis   . Schatzki's ring 08/26/10    Last dilated on EGD by Dr. Trevor I[facility], linear gastric erosions, BI hemigastrectomy  . Hyperplastic colon polyp 03/19/10    tcs by Dr. Gala Romney  . Pancreatic cancer ([place]) 02/2014  . Pancreatic cancer metastasized to liver ([place]) 03/10/2014    has Essential hypertension; GERD (gastroesophageal reflux disease); Anxiety; Primary generalized (osteo)arthritis; Back pain with radiation; IGT (impaired glucose tolerance); Anemia, iron deficiency; Elevated LFTs; Change in stool habits; Abnormal liver ultrasound; Pancreatic cancer metastasized to liver [facility]); Allergic cough; Loose stools; Allergic rhinitis; Cough; and Heme positive stool on her problem list.     is allergic to iohexol; aciphex; amlodipine besylate-valsartan; esomeprazole magnesium;  omeprazole; penicillins; and ciprofloxacin.  Current Outpatient Prescriptions on File Prior to Visit  Medication Sig Dispense Refill  . aspirin 81 MG EC tablet Take 81 mg by mouth every morning.     Marland Kitchen azelastine (ASTELIN) 0.1 % nasal spray Place 2 sprays into both nostrils 2 (two) times daily. Use in each nostril as directed 30 mL 12  . AZOR 10-40 MG per tablet TAKE 1 TABLET DAILY 90  tablet 0  . cetirizine (ZYRTEC) 10 MG tablet Take 1 tablet (10 mg total) by mouth every morning. 30 tablet 3  . cholestyramine light (PREVALITE) 4 GM/DOSE powder TAKE 1 SCOOP BY MOUTH DAILY 239.4 g 0  . clonazePAM (KLONOPIN) 0.5 MG tablet Take 1 tablet (0.5 mg total) by mouth 2 (two) times daily as needed for anxiety. 60 tablet 3  . cycloSPORINE (RESTASIS) 0.05 % ophthalmic emulsion Place 1 drop into both eyes 2 (two) times daily.    . ferrous sulfate 325 (65 FE) MG tablet Take 325 mg by mouth daily with breakfast.    . fluticasone (FLONASE) 50 MCG/ACT nasal spray Place 2 sprays into both nostrils daily.    Marland Kitchen lidocaine-prilocaine (EMLA) cream Apply a quarter size amount to port site 1 hour prior to chemo. Do not rub in. Cover with plastic wrap. 30 g 3  . montelukast (SINGULAIR) 10 MG tablet Take 1 tablet (10 mg total) by mouth at bedtime. 30 tablet 3  . nebivolol (BYSTOLIC) 10 MG tablet Take 1 tablet (10 mg total) by mouth daily. 90 tablet 1  . pantoprazole (PROTONIX) 20 MG tablet Take 1 tablet (20 mg total) by mouth daily. 90 tablet 0  . potassium chloride (MICRO-K) 10 MEQ CR capsule Take 1 capsule (10 mEq total) by mouth 2 (two) times daily. 60 capsule 1  . predniSONE (DELTASONE) 5 MG tablet Take 1 tablet (5 mg total) by mouth daily with breakfast. 30 tablet 1  . Probiotic Product (HEALTHY COLON PO) Take 1 tablet by mouth every morning.     . senna (SENOKOT) 8.6 MG tablet Take 1 tablet by mouth daily.    . benzonatate (TESSALON) 100 MG capsule Take 1 capsule (100 mg total) by mouth 2 (two) times daily as needed for cough. (Patient not taking: Reported on 02/21/2015) 20 capsule 0  . prochlorperazine (COMPAZINE) 10 MG tablet Take 1 tablet (10 mg total) by mouth every 6 (six) hours as needed for nausea or vomiting. (Patient not taking: Reported on 02/21/2015) 60 tablet 2  . traMADol (ULTRAM) 50 MG tablet Take 1 tablet (50 mg total) by mouth 2 (two) times daily. (Patient not taking: Reported on  02/21/2015) 60 tablet 2  . [DISCONTINUED] DiphenhydrAMINE HCl (ALKA-SELTZER PLUS ALLERGY PO) Take 1 tablet by mouth as needed.     No current facility-administered medications on file prior to visit.    Past Surgical History  Procedure Laterality Date  . Stomach ulcer  50 years ago     had some of her stomach removed   . Bunion removal      from both feet   . Left shoulder surgery  2009    DR HARRISON  . Colonoscopy  03/19/2010    DR Gala Romney,, normal TI, pancolonic diverticula, random colon bx neg., hyperplastic polyps removed  . Esophagogastroduodenoscopy  11/22/2003    DR Gala Romney, erosive RE, Billroth I  . Esophagogastroduodenoscopy  08/26/10    Dr. Gala Romney- moderate severe ERE, Scahtzki ring s/p dilation, Billroth I, linear gastric erosions, bx-gastric xanthelasma  . Breast  lumpectomy Left   . Orif ankle fracture Right 05/22/2013    Procedure: OPEN REDUCTION INTERNAL FIXATION (ORIF) RIGHT ANKLE FRACTURE;  Surgeon: Sanjuana Kava, MD;  Location: [facility];  Service: Orthopedics;  Laterality: Right;  . Cholecystectomy  1965     Denies any headaches, dizziness, double vision, fevers, chills, night sweats, nausea, vomiting, diarrhea, constipation, chest pain, heart palpitations, shortness of breath, blood in stool, black tarry stool, urinary pain, urinary burning, urinary frequency, hematuria.   PHYSICAL EXAMINATION  ECOG PERFORMANCE STATUS: 0 - Asymptomatic  Filed Vitals:   02/21/15 1016  BP: 145/63  Pulse: 62  Temp: 97.8 F (36.6 C)  Resp: 18    GENERAL:alert, no distress, well nourished, well developed, comfortable, cooperative and smiling SKIN: skin color, texture, turgor are normal, no rashes or significant lesions HEAD: Normocephalic, No masses, lesions, tenderness or abnormalities EYES: normal, PERRLA, EOMI, Conjunctiva are pink and non-injected EARS: External ears normal OROPHARYNX:lips, buccal mucosa, and tongue normal and mucous membranes are moist  NECK: supple, no  adenopathy, thyroid normal size, non-tender, without nodularity, no stridor, non-tender, trachea midline LYMPH:  no palpable lymphadenopathy BREAST:not examined LUNGS: clear to auscultation and percussion HEART: regular rate & rhythm, no murmurs, no gallops, S1 normal and S2 normal ABDOMEN:abdomen soft, non-tender, normal bowel sounds and no masses or organomegaly BACK: Back symmetric, no curvature., No CVA tenderness EXTREMITIES:less then 2 second capillary refill, no joint deformities, effusion, or inflammation, no edema, no skin discoloration, no clubbing, no cyanosis, no unilateral edema, no erythema, no heat, no popliteal or posterior tenderness, and negative Homan's sign.  NEURO: alert & oriented x 3 with fluent speech, no focal motor/sensory deficits, gait normal   LABORATORY DATA: CBC    Component Value Date/Time   WBC 6.5 02/21/2015 1054   RBC 4.06 02/21/2015 1054   HGB 11.4* 02/21/2015 1054   HCT 34.4* 02/21/2015 1054   PLT 321 02/21/2015 1054   MCV 84.7 02/21/2015 1054   MCH 28.1 02/21/2015 1054   MCHC 33.1 02/21/2015 1054   RDW 18.5* 02/21/2015 1054   LYMPHSABS 0.8 02/21/2015 1054   MONOABS 0.2 02/21/2015 1054   EOSABS 0.0 02/21/2015 1054   BASOSABS 0.0 02/21/2015 1054      Chemistry      Component Value Date/Time   NA 141 01/31/2015 1015   K 4.2 01/31/2015 1015   CL 108 01/31/2015 1015   CO2 28 01/31/2015 1015   BUN 18 01/31/2015 1015   CREATININE 1.08* 01/31/2015 1015   CREATININE 0.97 02/14/2014 0758      Component Value Date/Time   CALCIUM 8.6* 01/31/2015 1015   ALKPHOS 80 01/31/2015 1015   AST 19 01/31/2015 1015   ALT 14 01/31/2015 1015   BILITOT 0.7 01/31/2015 1015       PENDING LABS:   RADIOGRAPHIC STUDIES:  No results found.   PATHOLOGY:    ASSESSMENT AND PLAN:  Pancreatic cancer metastasized to liver Stage IV Pancreatic Cancer, on systemic therapy with Abraxane and Gemzar in a day 1, 8 fashion every 28 days.  Oncology history  reviewed.  This is up to date.  She continues to tolerate and respond to treatment.  Her pain is well controlled on Tramadol BID.  This is prescribed by Dr. Moshe Cipro.  I have asked the patient to put her Hydrocodone on hold.  She is agreeable to this.  She has refills for Tramadol at [facility].  She notes a left popliteal discomfort intermittently.  Physical exam is negative for concerning findings for  blood clot.  She is educated on signs and symptoms to be on the look out for and to inform us of issues such as heat, pain, swelling, and pain with walking.  Influenza vaccine administered today.  Return next week for day 8 of therapy.  Return in 4 weeks for follow-up.    THERAPY PLAN:  Continue with treatment as planned.  All questions were answered. The patient knows to call the clinic with any problems, questions or concerns. We can certainly see the patient much sooner if necessary.  Patient and plan discussed with Dr. Ancil Linsey and she is in agreement with the aforementioned.   This note is electronically signed by: Doy Mince 02/21/2015 11:22 AM

## 2015-02-21 NOTE — Patient Instructions (Signed)
[facility] Discharge Instructions for Patients Receiving Chemotherapy  Today you received the following chemotherapy agents:  Gemzar and Abraxane.   If you develop nausea and vomiting, or diarrhea that is not controlled by your medication, call the clinic.  The clinic phone number is [phone]. Office hours are Monday-Friday 8:30am-5:00pm.  BELOW ARE SYMPTOMS THAT SHOULD BE REPORTED IMMEDIATELY:  *FEVER GREATER THAN 101.0 F  *CHILLS WITH OR WITHOUT FEVER  NAUSEA AND VOMITING THAT IS NOT CONTROLLED WITH YOUR NAUSEA MEDICATION  *UNUSUAL SHORTNESS OF BREATH  *UNUSUAL BRUISING OR BLEEDING  TENDERNESS IN MOUTH AND THROAT WITH OR WITHOUT PRESENCE OF ULCERS  *URINARY PROBLEMS  *BOWEL PROBLEMS  UNUSUAL RASH Items with * indicate a potential emergency and should be followed up as soon as possible. If you have an emergency after office hours please contact your primary care physician or go to the nearest emergency department.  Please call the clinic during office hours if you have any questions or concerns.   You may also contact the Patient Navigator at [phone] should you have any questions or need assistance in obtaining follow up care. _____________________________________________________________________ Have you asked about our STAR program?    STAR stands for Survivorship Training and Rehabilitation, and this is a nationally recognized cancer care program that focuses on survivorship and rehabilitation.  Cancer and cancer treatments may cause problems, such as, pain, making you feel tired and keeping you from doing the things that you need or want to do. Cancer rehabilitation can help. Our goal is to reduce these troubling effects and help you have the best quality of life possible.  You may receive a survey from a nurse that asks questions about your current state of health.  Based on the survey results, all eligible patients will be referred to the STAR  program for an evaluation so we can better serve you! A frequently asked questions sheet is available upon request.            

## 2015-02-21 NOTE — Patient Instructions (Signed)
[place] at [facility] Discharge Instructions  RECOMMENDATIONS MADE BY THE CONSULTANT AND ANY TEST RESULTS WILL BE SENT TO YOUR REFERRING PHYSICIAN.  Exam and discussion by Robynn Pane, PA-C Will give you a flu shot today Report fevers, uncontrolled nausea, vomiting or other concerns.  Chemotherapy next week and follow-up in 4 weeks.  Thank you for choosing [place] at [facility] to provide your oncology and hematology care.  To afford each patient quality time with our provider, please arrive at least 15 minutes before your scheduled appointment time.    You need to re-schedule your appointment should you arrive 10 or more minutes late.  We strive to give you quality time with our providers, and arriving late affects you and other patients whose appointments are after yours.  Also, if you no show three or more times for appointments you may be dismissed from the clinic at the providers discretion.     Again, thank you for choosing [facility].  Our hope is that these requests will decrease the amount of time that you wait before being seen by our physicians.       _____________________________________________________________  Should you have questions after your visit to [facility], please contact our office at [phone] between the hours of 8:30 a.m. and 4:30 p.m.  Voicemails left after 4:30 p.m. will not be returned until the following business day.  For prescription refill requests, have your pharmacy contact our office.

## 2015-02-22 LAB — CANCER ANTIGEN 19-9: CA 19-9: 12 U/mL (ref 0–35)

## 2015-02-28 ENCOUNTER — Encounter (HOSPITAL_BASED_OUTPATIENT_CLINIC_OR_DEPARTMENT_OTHER): Payer: Medicare Other

## 2015-02-28 VITALS — BP 140/58 | HR 48 | Temp 97.8°F | Resp 16 | Wt 149.6 lb

## 2015-02-28 DIAGNOSIS — C259 Malignant neoplasm of pancreas, unspecified: Secondary | ICD-10-CM | POA: Diagnosis not present

## 2015-02-28 DIAGNOSIS — C252 Malignant neoplasm of tail of pancreas: Secondary | ICD-10-CM | POA: Diagnosis not present

## 2015-02-28 DIAGNOSIS — Z5111 Encounter for antineoplastic chemotherapy: Secondary | ICD-10-CM

## 2015-02-28 DIAGNOSIS — C787 Secondary malignant neoplasm of liver and intrahepatic bile duct: Secondary | ICD-10-CM

## 2015-02-28 DIAGNOSIS — C801 Malignant (primary) neoplasm, unspecified: Secondary | ICD-10-CM | POA: Diagnosis not present

## 2015-02-28 LAB — COMPREHENSIVE METABOLIC PANEL
ALBUMIN: 3.8 g/dL (ref 3.5–5.0)
ALT: 17 U/L (ref 14–54)
AST: 19 U/L (ref 15–41)
Alkaline Phosphatase: 69 U/L (ref 38–126)
Anion gap: 5 (ref 5–15)
BILIRUBIN TOTAL: 0.7 mg/dL (ref 0.3–1.2)
BUN: 20 mg/dL (ref 6–20)
CALCIUM: 8.9 mg/dL (ref 8.9–10.3)
CHLORIDE: 108 mmol/L (ref 101–111)
CO2: 27 mmol/L (ref 22–32)
CREATININE: 0.9 mg/dL (ref 0.44–1.00)
GFR calc Af Amer: >60 mL/min (ref >60)
GFR calc non Af Amer: 57 mL/min — ABNORMAL LOW (ref >60)
Glucose, Bld: 131 mg/dL — ABNORMAL HIGH (ref 65–99)
POTASSIUM: 4.4 mmol/L (ref 3.5–5.1)
Sodium: 140 mmol/L (ref 135–145)
TOTAL PROTEIN: 6.8 g/dL (ref 6.5–8.1)

## 2015-02-28 LAB — CBC WITH DIFFERENTIAL/PLATELET
BASOS ABS: 0 10*3/uL (ref 0.0–0.1)
BASOS PCT: 0 %
Eosinophils Absolute: 0 10*3/uL (ref 0.0–0.7)
Eosinophils Relative: 1 %
HEMATOCRIT: 31.8 % — AB (ref 36.0–46.0)
Hemoglobin: 10.3 g/dL — ABNORMAL LOW (ref 12.0–15.0)
LYMPHS PCT: 35 %
Lymphs Abs: 1.1 10*3/uL (ref 0.7–4.0)
MCH: 27.5 pg (ref 26.0–34.0)
MCHC: 32.4 g/dL (ref 30.0–36.0)
MCV: 84.8 fL (ref 78.0–100.0)
MONO ABS: 0.1 10*3/uL (ref 0.1–1.0)
Monocytes Relative: 2 %
NEUTROS ABS: 1.9 10*3/uL (ref 1.7–7.7)
NEUTROS PCT: 62 %
PLATELETS: 126 10*3/uL — AB (ref 150–400)
RBC: 3.75 MIL/uL — AB (ref 3.87–5.11)
RDW: 17.6 % — AB (ref 11.5–15.5)
WBC: 3.1 10*3/uL — AB (ref 4.0–10.5)

## 2015-02-28 MED ORDER — SODIUM CHLORIDE 0.9 % IJ SOLN
10.0000 mL | INTRAMUSCULAR | Status: DC | PRN
  Administered 2015-02-28: 10 mL
  Filled 2015-02-28: qty 10

## 2015-02-28 MED ORDER — DEXAMETHASONE SODIUM PHOSPHATE 10 MG/ML IJ SOLN
10.0000 mg | Freq: Once | INTRAMUSCULAR | Status: AC
  Administered 2015-02-28: 10 mg via INTRAVENOUS

## 2015-02-28 MED ORDER — PACLITAXEL PROTEIN-BOUND CHEMO INJECTION 100 MG
125.0000 mg/m2 | Freq: Once | INTRAVENOUS | Status: AC
  Administered 2015-02-28: 225 mg via INTRAVENOUS
  Filled 2015-02-28: qty 45

## 2015-02-28 MED ORDER — SODIUM CHLORIDE 0.9 % IV SOLN
1000.0000 mg/m2 | Freq: Once | INTRAVENOUS | Status: AC
  Administered 2015-02-28: 1786 mg via INTRAVENOUS
  Filled 2015-02-28: qty 46.97

## 2015-02-28 MED ORDER — SODIUM CHLORIDE 0.9 % IV SOLN
Freq: Once | INTRAVENOUS | Status: DC
  Filled 2015-02-28: qty 1

## 2015-02-28 MED ORDER — ONDANSETRON HCL 40 MG/20ML IJ SOLN
8.0000 mg | Freq: Once | INTRAMUSCULAR | Status: AC
  Administered 2015-02-28: 8 mg via INTRAVENOUS
  Filled 2015-02-28: qty 4

## 2015-02-28 MED ORDER — SODIUM CHLORIDE 0.9 % IV SOLN: 8.0000 mg | Freq: Once | INTRAVENOUS | Status: DC

## 2015-02-28 MED ORDER — HEPARIN SOD (PORK) LOCK FLUSH 100 UNIT/ML IV SOLN
500.0000 [IU] | Freq: Once | INTRAVENOUS | Status: AC | PRN
  Administered 2015-02-28: 500 [IU]

## 2015-02-28 MED ORDER — SODIUM CHLORIDE 0.9 % IV SOLN
Freq: Once | INTRAVENOUS | Status: AC
  Administered 2015-02-28: 11:00:00 via INTRAVENOUS

## 2015-02-28 MED ORDER — HEPARIN SOD (PORK) LOCK FLUSH 100 UNIT/ML IV SOLN
INTRAVENOUS | Status: AC
  Filled 2015-02-28: qty 5

## 2015-02-28 NOTE — Addendum Note (Signed)
Encounter addended by: Cathlean Marseilles, RPH on: 02/28/2015 11:57 AM<BR>     Documentation filed: Orders

## 2015-02-28 NOTE — Progress Notes (Signed)
Tolerated chemo well. 

## 2015-02-28 NOTE — Patient Instructions (Signed)
[facility] Discharge Instructions for Patients Receiving Chemotherapy  Today you received the following chemotherapy agents Abraxane and Gemzar Day 8.  To help prevent nausea and vomiting after your treatment, we encourage you to take your nausea medication as instructed. If you develop nausea and vomiting that is not controlled by your nausea medication, call the clinic. If it is after clinic hours your family physician or the after hours number for the clinic or go to the Emergency Department. BELOW ARE SYMPTOMS THAT SHOULD BE REPORTED IMMEDIATELY:  *FEVER GREATER THAN 101.0 F  *CHILLS WITH OR WITHOUT FEVER  NAUSEA AND VOMITING THAT IS NOT CONTROLLED WITH YOUR NAUSEA MEDICATION  *UNUSUAL SHORTNESS OF BREATH  *UNUSUAL BRUISING OR BLEEDING  TENDERNESS IN MOUTH AND THROAT WITH OR WITHOUT PRESENCE OF ULCERS  *URINARY PROBLEMS  *BOWEL PROBLEMS  UNUSUAL RASH Items with * indicate a potential emergency and should be followed up as soon as possible.  Return as scheduled.  I have been informed and understand all the instructions given to me. I know to contact the clinic, my physician, or go to the Emergency Department if any problems should occur. I do not have any questions at this time, but understand that I may call the clinic during office hours or the Patient Navigator at [phone] should I have any questions or need assistance in obtaining follow up care.    __________________________________________  _____________  __________ Signature of Patient or Authorized Representative            Date                   Time    __________________________________________ Nurse's Signature

## 2015-03-12 ENCOUNTER — Other Ambulatory Visit: Payer: Self-pay | Admitting: Family Medicine

## 2015-03-13 ENCOUNTER — Other Ambulatory Visit (HOSPITAL_COMMUNITY): Payer: Self-pay | Admitting: Oncology

## 2015-03-13 DIAGNOSIS — C787 Secondary malignant neoplasm of liver and intrahepatic bile duct: Principal | ICD-10-CM

## 2015-03-13 DIAGNOSIS — C259 Malignant neoplasm of pancreas, unspecified: Secondary | ICD-10-CM

## 2015-03-13 MED ORDER — CYCLOBENZAPRINE HCL 10 MG PO TABS: 10.0000 mg | ORAL_TABLET | Freq: Two times a day (BID) | ORAL | Status: DC | PRN

## 2015-03-14 ENCOUNTER — Telehealth: Payer: Self-pay | Admitting: Family Medicine

## 2015-03-14 ENCOUNTER — Ambulatory Visit (INDEPENDENT_AMBULATORY_CARE_PROVIDER_SITE_OTHER): Payer: Medicare Other

## 2015-03-14 DIAGNOSIS — M5431 Sciatica, right side: Secondary | ICD-10-CM | POA: Diagnosis not present

## 2015-03-14 MED ORDER — METHYLPREDNISOLONE ACETATE 80 MG/ML IJ SUSP
80.0000 mg | Freq: Once | INTRAMUSCULAR | Status: AC
  Administered 2015-03-14: 80 mg via INTRAMUSCULAR

## 2015-03-14 MED ORDER — PREDNISONE 5 MG (21) PO TBPK: ORAL_TABLET | ORAL | Status: DC

## 2015-03-14 MED ORDER — KETOROLAC TROMETHAMINE 60 MG/2ML IM SOLN
60.0000 mg | Freq: Once | INTRAMUSCULAR | Status: AC
  Administered 2015-03-14: 60 mg via INTRAMUSCULAR

## 2015-03-14 NOTE — Telephone Encounter (Signed)
Offer toradol 60 mg and depo medrol 80 mg IM and prednisone 10 mg one twice daily for 5 days, hold the 5 mg tab, sounds like sciatic pain

## 2015-03-14 NOTE — Telephone Encounter (Signed)
Called pt no answer °

## 2015-03-14 NOTE — Addendum Note (Signed)
Addended by: Eual Fines on: 03/14/2015 03:50 PM   Modules accepted: Orders

## 2015-03-14 NOTE — Telephone Encounter (Signed)
Having severe pain in back and side and all down the right leg and right side, she states that the cancer center called her in something yesterday evening that helped some, please advise?

## 2015-03-14 NOTE — Telephone Encounter (Signed)
Pt aware and came into office for nv

## 2015-03-14 NOTE — Telephone Encounter (Signed)
Right sided pain that goes into her right cheek and leg. Hurts to raise leg and to walk. Rates pain at a 9. Gets some relief when sitting. Cancer center called her in flexeril yesterday but the insurance wouldn't cover it. Wants to know if you will call something in for the pain. Please advise

## 2015-03-14 NOTE — Progress Notes (Signed)
Injections given per Dr written order. Prednisone dose pack sent to pharmacy and patient aware. Instructed to call back if pain not better

## 2015-03-19 ENCOUNTER — Encounter (HOSPITAL_COMMUNITY): Payer: Medicare Other | Attending: Hematology and Oncology | Admitting: Oncology

## 2015-03-19 ENCOUNTER — Other Ambulatory Visit (HOSPITAL_COMMUNITY): Payer: Medicare Other

## 2015-03-19 VITALS — BP 168/58 | HR 50 | Temp 98.1°F | Resp 16 | Wt 151.2 lb

## 2015-03-19 DIAGNOSIS — T50995D Adverse effect of other drugs, medicaments and biological substances, subsequent encounter: Secondary | ICD-10-CM

## 2015-03-19 DIAGNOSIS — C787 Secondary malignant neoplasm of liver and intrahepatic bile duct: Secondary | ICD-10-CM

## 2015-03-19 DIAGNOSIS — M545 Low back pain: Secondary | ICD-10-CM

## 2015-03-19 DIAGNOSIS — C259 Malignant neoplasm of pancreas, unspecified: Secondary | ICD-10-CM | POA: Diagnosis not present

## 2015-03-19 DIAGNOSIS — C252 Malignant neoplasm of tail of pancreas: Secondary | ICD-10-CM | POA: Insufficient documentation

## 2015-03-19 DIAGNOSIS — M5417 Radiculopathy, lumbosacral region: Secondary | ICD-10-CM

## 2015-03-19 DIAGNOSIS — C801 Malignant (primary) neoplasm, unspecified: Secondary | ICD-10-CM | POA: Insufficient documentation

## 2015-03-19 DIAGNOSIS — M25551 Pain in right hip: Secondary | ICD-10-CM

## 2015-03-19 DIAGNOSIS — R530 Neoplastic (malignant) related fatigue: Secondary | ICD-10-CM

## 2015-03-19 MED ORDER — HYDROCODONE-ACETAMINOPHEN 5-325 MG PO TABS: 0.5000 | ORAL_TABLET | ORAL | Status: DC | PRN

## 2015-03-19 MED ORDER — DIPHENHYDRAMINE HCL 50 MG PO TABS: ORAL_TABLET | ORAL | Status: DC

## 2015-03-19 MED ORDER — PREDNISONE 50 MG PO TABS: ORAL_TABLET | ORAL | Status: DC

## 2015-03-19 MED ORDER — PREDNISONE 5 MG PO TABS: 5.0000 mg | ORAL_TABLET | Freq: Every day | ORAL | Status: DC

## 2015-03-19 NOTE — Assessment & Plan Note (Addendum)
Stage IV Pancreatic Cancer, on systemic therapy with Abraxane and Gemzar in a day 1, 8 fashion every 28 days.  Oncology history reviewed.  This is up to date.  Based upon her symptoms, it really sounds like she has sciatica.  She has been treated by Dr. Moshe Cipro successfully and of course appropriately.  However, given her long length of response to therapy, and the fact that she has Stage IV pancreatic Ca, I feel obligated to image her to rule out progressive disease or new findings of disease.  Case discussed with Dr. Ardeen Garland for radiologic recommendations.    CT pelvis with contrast ordered with pre-meds to avoid anaphylaxis: Prednisone 50 mg 13, 7, and 1 hour prior to CT and 50 mg of Benadryl 1 hour prior to CT scan.  MRI of L spine w and without contrast.  I have asked her to hold her Tramadol.  A new Rx for hydrocodone/APAP 5-325 mg (compared to 7.5 mg of hydrocodone) provided.  She can take 1/2- 1 tablet every 4 hours PRN.  She is provided educated on "as needed" pain medication.    Plan on treatment on Wednesday as planned with pre-chemo labs.  Suspecting negative imaging, I will see the patient back in 5 weeks for follow-up.  If imaging is worrisome, will work the patient into the clinic schedule for detailed review and further recommendations.

## 2015-03-19 NOTE — Progress Notes (Signed)
[name] Nakayama, MD [address] [place] [address]  Neoplastic malignant related fatigue - Plan: predniSONE (DELTASONE) 5 MG tablet  Pancreatic cancer metastasized to liver [facility]) - Plan: HYDROcodone-acetaminophen (NORCO/VICODIN) 5-325 MG tablet  Allergy to IVP dye, subsequent encounter - Plan: predniSONE (DELTASONE) 50 MG tablet, diphenhydrAMINE (BENADRYL) 50 MG tablet  Right lumbosacral radiculopathy - Plan: CT Pelvis W Contrast, MR Lumbar Spine W Wo Contrast  Right hip pain - Plan: CT Pelvis W Contrast  CURRENT THERAPY: Palliative Gemcitabine/Abraxane  INTERVAL HISTORY: [name] 81 y.o. female returns for followup of Stage IV pancreatic cancer.    Pancreatic cancer metastasized to liver ([place])   03/10/2014 Initial Diagnosis Pancreatic cancer metastasized to liver   03/22/2014 -  Chemotherapy Abraxane/Gemzar days 1, 8, every 28 days.  Day 15 was cancelled due to leukopenia and thrombocytopenia on day 15 cycle 1.   05/24/2014 Treatment Plan Change Day 8 of cycle 3 is held with ANC of 1.1   06/05/2014 Imaging CT C/A/P . Interval decrease in size of the pancreatic tail mass.Improved hepatic metastatic disease. No new lesions. No CT findings for metastatic disease involving the chest.   08/09/2014 Tumor Marker CA 19-9= 33 (WNL)   10/26/2014 Imaging MRI- Continued interval decrease in size of the hepatic metastatic lesions and no new lesions are identified. Continued decrease in size of the pancreatic tail lesion.   01/03/2015 Tumor Marker Results for TUMEKA, CHIMENTI (MRN [id number]) as of 01/18/2015 12:52  01/03/2015 10:00 CA 19-9: 14    01/17/2015 Imaging MRI- Response to therapy of hepatic metastasis.  Similar size of a pancreatic tail lesion.    PATIENT IS BEING SEEN SOONER THAN PREVIOUSLY SCHEDULED.  Tanayah reports a significant right hip pain with pain radiating down her right leg.  She notes back pain that is lateral to midline.  This pain has made  her miserable.  She saw Dr. Moshe Cipro recently who provided Toradol and Prednisone dose pak.  She notes that this helped with her pain.  She rates her current pain a 2/10.  She reports that this pain has hindered her ability to ambulate.  She denies any loss of bowel or bladder control.  She denies any urinary complaints.  She reports that Tramadol is not helping any longer.  She notes that Hydrocodone was more effective, but the "dose was too high."     Past Medical History  Diagnosis Date  . Hypertension 20 years   . Chronic diarrhea   . Anxiety   . Depression   . Kidney stone     hx/ crushed   . Hx of adenomatous colonic polyps     tubular adenomas, last found in 2008  . GERD (gastroesophageal reflux disease)   . Erosive esophagitis   . Schatzki's ring 08/26/10    Last dilated on EGD by Dr. Trevor [facility], linear gastric erosions, BI hemigastrectomy  . Hyperplastic colon polyp 03/19/10    tcs by Dr. Gala Romney  . Pancreatic cancer ([place]) 02/2014  . Pancreatic cancer metastasized to liver ([place]) 03/10/2014    has Essential hypertension; GERD (gastroesophageal reflux disease); Anxiety; Primary generalized (osteo)arthritis; Back pain with radiation; IGT (impaired glucose tolerance); Anemia, iron deficiency; Elevated LFTs; Change in stool habits; Abnormal liver ultrasound; Pancreatic cancer metastasized to liver [facility]); Allergic cough; Loose stools; Allergic rhinitis; Cough; and Heme positive stool on her problem list.     is allergic to iohexol; aciphex; amlodipine besylate-valsartan; esomeprazole magnesium; omeprazole; penicillins; and  ciprofloxacin.  Current Outpatient Prescriptions on File Prior to Visit  Medication Sig Dispense Refill  . aspirin 81 MG EC tablet Take 81 mg by mouth every morning.     Marland Kitchen azelastine (ASTELIN) 0.1 % nasal spray Place 2 sprays into both nostrils 2 (two) times daily. Use in each nostril as directed 30 mL 12  . AZOR 10-40 MG tablet TAKE 1 TABLET DAILY 90 tablet 0  .  cetirizine (ZYRTEC) 10 MG tablet Take 1 tablet (10 mg total) by mouth every morning. 30 tablet 3  . cholestyramine light (PREVALITE) 4 GM/DOSE powder TAKE 1 SCOOP BY MOUTH DAILY 239.4 g 0  . clonazePAM (KLONOPIN) 0.5 MG tablet Take 1 tablet (0.5 mg total) by mouth 2 (two) times daily as needed for anxiety. 60 tablet 3  . cyclobenzaprine (FLEXERIL) 10 MG tablet Take 1 tablet (10 mg total) by mouth 2 (two) times daily as needed for muscle spasms. 30 tablet 1  . cycloSPORINE (RESTASIS) 0.05 % ophthalmic emulsion Place 1 drop into both eyes 2 (two) times daily.    . ferrous sulfate 325 (65 FE) MG tablet Take 325 mg by mouth daily with breakfast.    . fluticasone (FLONASE) 50 MCG/ACT nasal spray Place 2 sprays into both nostrils daily.    Marland Kitchen lidocaine-prilocaine (EMLA) cream Apply a quarter size amount to port site 1 hour prior to chemo. Do not rub in. Cover with plastic wrap. 30 g 3  . montelukast (SINGULAIR) 10 MG tablet Take 1 tablet (10 mg total) by mouth at bedtime. 30 tablet 3  . nebivolol (BYSTOLIC) 10 MG tablet Take 1 tablet (10 mg total) by mouth daily. 90 tablet 1  . pantoprazole (PROTONIX) 20 MG tablet Take 1 tablet (20 mg total) by mouth daily. 90 tablet 0  . potassium chloride (MICRO-K) 10 MEQ CR capsule Take 1 capsule (10 mEq total) by mouth 2 (two) times daily. 60 capsule 1  . predniSONE (STERAPRED UNI-PAK 21 TAB) 5 MG (21) TBPK tablet Use as directed 21 tablet 0  . Probiotic Product (HEALTHY COLON PO) Take 1 tablet by mouth every morning.     . senna (SENOKOT) 8.6 MG tablet Take 1 tablet by mouth daily.    . benzonatate (TESSALON) 100 MG capsule Take 1 capsule (100 mg total) by mouth 2 (two) times daily as needed for cough. (Patient not taking: Reported on 02/21/2015) 20 capsule 0  . prochlorperazine (COMPAZINE) 10 MG tablet Take 1 tablet (10 mg total) by mouth every 6 (six) hours as needed for nausea or vomiting. (Patient not taking: Reported on 02/21/2015) 60 tablet 2   Current  Facility-Administered Medications on File Prior to Visit  Medication Dose Route Frequency Provider Last Rate Last Dose  . dexamethasone (DECADRON) 10 mg in sodium chloride 0.9 % 50 mL IVPB   Intravenous Once Patrici Ranks, MD        Past Surgical History  Procedure Laterality Date  . Stomach ulcer  50 years ago     had some of her stomach removed   . Bunion removal      from both feet   . Left shoulder surgery  2009    DR HARRISON  . Colonoscopy  03/19/2010    DR Gala Romney,, normal TI, pancolonic diverticula, random colon bx neg., hyperplastic polyps removed  . Esophagogastroduodenoscopy  11/22/2003    DR Gala Romney, erosive RE, Billroth I  . Esophagogastroduodenoscopy  08/26/10    Dr. Gala Romney- moderate severe ERE, Scahtzki ring s/p dilation, Billroth  I, linear gastric erosions, bx-gastric xanthelasma  . Breast lumpectomy Left   . Orif ankle fracture Right 05/22/2013    Procedure: OPEN REDUCTION INTERNAL FIXATION (ORIF) RIGHT ANKLE FRACTURE;  Surgeon: Sanjuana Kava, MD;  Location: [facility];  Service: Orthopedics;  Laterality: Right;  . Cholecystectomy  1965     Denies any headaches, dizziness, double vision, fevers, chills, night sweats, nausea, vomiting, diarrhea, constipation, chest pain, heart palpitations, shortness of breath, blood in stool, black tarry stool, urinary pain, urinary burning, urinary frequency, hematuria.   PHYSICAL EXAMINATION  ECOG PERFORMANCE STATUS: 2 - Symptomatic, <50% confined to bed  Filed Vitals:   03/19/15 1433  BP: 168/58  Pulse: 50  Temp: 98.1 F (36.7 C)  Resp: 16    GENERAL:alert, well nourished, well developed, comfortable, cooperative, smiling and accompanied by her son and sister. SKIN: skin color, texture, turgor are normal, no rashes or significant lesions HEAD: Normocephalic, No masses, lesions, tenderness or abnormalities EYES: normal, PERRLA, EOMI, Conjunctiva are pink and non-injected EARS: External ears normal OROPHARYNX:lips, buccal mucosa,  and tongue normal and mucous membranes are moist  NECK: supple, trachea midline LYMPH:  no palpable lymphadenopathy BREAST:not examined LUNGS: clear to auscultation  HEART: regular rate & rhythm ABDOMEN:abdomen soft and normal bowel sounds BACK: Back symmetric, no curvature. EXTREMITIES:less then 2 second capillary refill, no joint deformities, effusion, or inflammation, no skin discoloration, no cyanosis  NEURO: alert & oriented x 3 with fluent speech, no focal motor/sensory deficits, in a wheelchair    LABORATORY DATA: CBC    Component Value Date/Time   WBC 3.1* 02/28/2015 0920   RBC 3.75* 02/28/2015 0920   HGB 10.3* 02/28/2015 0920   HCT 31.8* 02/28/2015 0920   PLT 126* 02/28/2015 0920   MCV 84.8 02/28/2015 0920   MCH 27.5 02/28/2015 0920   MCHC 32.4 02/28/2015 0920   RDW 17.6* 02/28/2015 0920   LYMPHSABS 1.1 02/28/2015 0920   MONOABS 0.1 02/28/2015 0920   EOSABS 0.0 02/28/2015 0920   BASOSABS 0.0 02/28/2015 0920      Chemistry      Component Value Date/Time   NA 140 02/28/2015 0920   K 4.4 02/28/2015 0920   CL 108 02/28/2015 0920   CO2 27 02/28/2015 0920   BUN 20 02/28/2015 0920   CREATININE 0.90 02/28/2015 0920   CREATININE 0.97 02/14/2014 0758      Component Value Date/Time   CALCIUM 8.9 02/28/2015 0920   ALKPHOS 69 02/28/2015 0920   AST 19 02/28/2015 0920   ALT 17 02/28/2015 0920   BILITOT 0.7 02/28/2015 0920       PENDING LABS:   RADIOGRAPHIC STUDIES:  No results found.   PATHOLOGY:    ASSESSMENT AND PLAN:  Pancreatic cancer metastasized to liver Stage IV Pancreatic Cancer, on systemic therapy with Abraxane and Gemzar in a day 1, 8 fashion every 28 days.  Oncology history reviewed.  This is up to date.  Based upon her symptoms, it really sounds like she has sciatica.  She has been treated by Dr. Moshe Cipro successfully and of course appropriately.  However, given her long length of response to therapy, and the fact that she has Stage IV  pancreatic Ca, I feel obligated to image her to rule out progressive disease or new findings of disease.  Case discussed with Dr. Ardeen Garland for radiologic recommendations.    CT pelvis with contrast ordered with pre-meds to avoid anaphylaxis: Prednisone 50 mg 13, 7, and 1 hour prior to CT and 50 mg of  Benadryl 1 hour prior to CT scan.  MRI of L spine w and without contrast.  I have asked her to hold her Tramadol.  A new Rx for hydrocodone/APAP 5-325 mg (compared to 7.5 mg of hydrocodone) provided.  She can take 1/2- 1 tablet every 4 hours PRN.  She is provided educated on "as needed" pain medication.    Plan on treatment on Wednesday as planned with pre-chemo labs.  Suspecting negative imaging, I will see the patient back in 5 weeks for follow-up.  If imaging is worrisome, will work the patient into the clinic schedule for detailed review and further recommendations.    THERAPY PLAN:  Continue treatment as planned, unless imaging is concerning for progression of disease.  All questions were answered. The patient knows to call the clinic with any problems, questions or concerns. We can certainly see the patient much sooner if necessary.  Patient and plan discussed with Dr. Ancil Linsey and she is in agreement with the aforementioned.   This note is electronically signed by: Doy Mince 03/19/2015 5:42 PM

## 2015-03-19 NOTE — Patient Instructions (Signed)
[place] at [facility] Discharge Instructions  RECOMMENDATIONS MADE BY THE CONSULTANT AND ANY TEST RESULTS WILL BE SENT TO YOUR REFERRING PHYSICIAN.  We will schedule you for a CT scan of the pelvis. Thursday at 130 pm. We will schedule you for a MRI of your spine. Tuesday at 7 am. You were given a prescription for Prednisone and Benadryl to take prior to CT scan, take as directed. [facility] for hydrocodone. Return for treatment Wednesday as scheduled.  BEFORE CT SCAN PREDNISONE 50 MG 13 HOURS BEFORE (1230 am Thursday)                                   PREDNISONE 50 MG 7 HOURS BEFORE (630 am Thursday)                                   PREDNISONE 50 MG 1 HOUR BEFORE (1230 pm Thursday)                                   BENADRYL 50 MG 1 HOUR BEFORE (1230 pm Thursday)  Thank you for choosing [place] at [facility] to provide your oncology and hematology care.  To afford each patient quality time with our provider, please arrive at least 15 minutes before your scheduled appointment time.    You need to re-schedule your appointment should you arrive 10 or more minutes late.  We strive to give you quality time with our providers, and arriving late affects you and other patients whose appointments are after yours.  Also, if you no show three or more times for appointments you may be dismissed from the clinic at the providers discretion.     Again, thank you for choosing [facility].  Our hope is that these requests will decrease the amount of time that you wait before being seen by our physicians.       _____________________________________________________________  Should you have questions after your visit to [facility], please contact our office at [phone] between the hours of 8:30 a.m. and 4:30 p.m.  Voicemails left after 4:30 p.m. will not be returned until the following business day.  For prescription refill  requests, have your pharmacy contact our office.

## 2015-03-20 ENCOUNTER — Ambulatory Visit (HOSPITAL_COMMUNITY)
Admission: RE | Admit: 2015-03-20 | Discharge: 2015-03-20 | Disposition: A | Discharge status: Home / Self Care | Payer: Medicare Other | Source: Ambulatory Visit | Attending: Oncology | Admitting: Oncology

## 2015-03-20 DIAGNOSIS — M5126 Other intervertebral disc displacement, lumbar region: Secondary | ICD-10-CM | POA: Insufficient documentation

## 2015-03-20 DIAGNOSIS — M5417 Radiculopathy, lumbosacral region: Secondary | ICD-10-CM

## 2015-03-20 DIAGNOSIS — M545 Low back pain: Secondary | ICD-10-CM | POA: Insufficient documentation

## 2015-03-20 DIAGNOSIS — C259 Malignant neoplasm of pancreas, unspecified: Secondary | ICD-10-CM | POA: Diagnosis not present

## 2015-03-20 DIAGNOSIS — M4806 Spinal stenosis, lumbar region: Secondary | ICD-10-CM | POA: Diagnosis not present

## 2015-03-20 MED ORDER — GADOBENATE DIMEGLUMINE 529 MG/ML IV SOLN
14.0000 mL | Freq: Once | INTRAVENOUS | Status: AC | PRN
  Administered 2015-03-20: 14 mL via INTRAVENOUS

## 2015-03-21 ENCOUNTER — Encounter (HOSPITAL_BASED_OUTPATIENT_CLINIC_OR_DEPARTMENT_OTHER): Payer: Medicare Other

## 2015-03-21 ENCOUNTER — Ambulatory Visit (HOSPITAL_COMMUNITY): Payer: Medicare Other | Admitting: Oncology

## 2015-03-21 ENCOUNTER — Encounter (HOSPITAL_BASED_OUTPATIENT_CLINIC_OR_DEPARTMENT_OTHER): Payer: Medicare Other | Admitting: Oncology

## 2015-03-21 VITALS — BP 140/58 | HR 48 | Temp 98.1°F | Resp 16 | Wt 152.4 lb

## 2015-03-21 DIAGNOSIS — C259 Malignant neoplasm of pancreas, unspecified: Secondary | ICD-10-CM

## 2015-03-21 DIAGNOSIS — C787 Secondary malignant neoplasm of liver and intrahepatic bile duct: Secondary | ICD-10-CM | POA: Diagnosis not present

## 2015-03-21 DIAGNOSIS — Z5111 Encounter for antineoplastic chemotherapy: Secondary | ICD-10-CM | POA: Diagnosis present

## 2015-03-21 DIAGNOSIS — C801 Malignant (primary) neoplasm, unspecified: Secondary | ICD-10-CM | POA: Diagnosis not present

## 2015-03-21 DIAGNOSIS — C252 Malignant neoplasm of tail of pancreas: Secondary | ICD-10-CM | POA: Diagnosis not present

## 2015-03-21 LAB — CBC WITH DIFFERENTIAL/PLATELET
Basophils Absolute: 0 10*3/uL (ref 0.0–0.1)
Basophils Relative: 0 %
EOS ABS: 0 10*3/uL (ref 0.0–0.7)
EOS PCT: 0 %
HCT: 32.2 % — ABNORMAL LOW (ref 36.0–46.0)
Hemoglobin: 10.6 g/dL — ABNORMAL LOW (ref 12.0–15.0)
LYMPHS ABS: 1.2 10*3/uL (ref 0.7–4.0)
Lymphocytes Relative: 13 %
MCH: 28 pg (ref 26.0–34.0)
MCHC: 32.9 g/dL (ref 30.0–36.0)
MCV: 85.2 fL (ref 78.0–100.0)
MONO ABS: 0.6 10*3/uL (ref 0.1–1.0)
Monocytes Relative: 6 %
Neutro Abs: 7.8 10*3/uL — ABNORMAL HIGH (ref 1.7–7.7)
Neutrophils Relative %: 81 %
PLATELETS: 298 10*3/uL (ref 150–400)
RBC: 3.78 MIL/uL — AB (ref 3.87–5.11)
RDW: 19.4 % — AB (ref 11.5–15.5)
WBC: 9.6 10*3/uL (ref 4.0–10.5)

## 2015-03-21 LAB — COMPREHENSIVE METABOLIC PANEL
ALT: 24 U/L (ref 14–54)
ANION GAP: 8 (ref 5–15)
AST: 22 U/L (ref 15–41)
Albumin: 3.9 g/dL (ref 3.5–5.0)
Alkaline Phosphatase: 77 U/L (ref 38–126)
BUN: 34 mg/dL — ABNORMAL HIGH (ref 6–20)
CHLORIDE: 110 mmol/L (ref 101–111)
CO2: 24 mmol/L (ref 22–32)
Calcium: 8.9 mg/dL (ref 8.9–10.3)
Creatinine, Ser: 1.03 mg/dL — ABNORMAL HIGH (ref 0.44–1.00)
GFR calc non Af Amer: 49 mL/min — ABNORMAL LOW (ref >60)
GFR, EST AFRICAN AMERICAN: 56 mL/min — AB (ref >60)
Glucose, Bld: 154 mg/dL — ABNORMAL HIGH (ref 65–99)
POTASSIUM: 4 mmol/L (ref 3.5–5.1)
SODIUM: 142 mmol/L (ref 135–145)
Total Bilirubin: 0.6 mg/dL (ref 0.3–1.2)
Total Protein: 6.7 g/dL (ref 6.5–8.1)

## 2015-03-21 MED ORDER — SODIUM CHLORIDE 0.9 % IV SOLN: 8.0000 mg | Freq: Once | INTRAVENOUS | Status: DC

## 2015-03-21 MED ORDER — SODIUM CHLORIDE 0.9 % IV SOLN
Freq: Once | INTRAVENOUS | Status: AC
  Administered 2015-03-21: 11:00:00 via INTRAVENOUS

## 2015-03-21 MED ORDER — PACLITAXEL PROTEIN-BOUND CHEMO INJECTION 100 MG
125.0000 mg/m2 | Freq: Once | INTRAVENOUS | Status: AC
  Administered 2015-03-21: 225 mg via INTRAVENOUS
  Filled 2015-03-21: qty 45

## 2015-03-21 MED ORDER — DEXAMETHASONE SODIUM PHOSPHATE 10 MG/ML IJ SOLN: 10.0000 mg | Freq: Once | INTRAMUSCULAR | Status: DC

## 2015-03-21 MED ORDER — SODIUM CHLORIDE 0.9 % IJ SOLN: 10.0000 mL | INTRAMUSCULAR | Status: DC | PRN

## 2015-03-21 MED ORDER — HEPARIN SOD (PORK) LOCK FLUSH 100 UNIT/ML IV SOLN
500.0000 [IU] | Freq: Once | INTRAVENOUS | Status: AC | PRN
  Administered 2015-03-21: 500 [IU]

## 2015-03-21 MED ORDER — SODIUM CHLORIDE 0.9 % IV SOLN
1000.0000 mg/m2 | Freq: Once | INTRAVENOUS | Status: AC
  Administered 2015-03-21: 1786 mg via INTRAVENOUS
  Filled 2015-03-21: qty 46.97

## 2015-03-21 MED ORDER — SODIUM CHLORIDE 0.9 % IV SOLN
Freq: Once | INTRAVENOUS | Status: AC
  Administered 2015-03-21: 8 mg via INTRAVENOUS
  Filled 2015-03-21: qty 4

## 2015-03-21 NOTE — Progress Notes (Signed)
Patient tolerated infusion well.  VSS post infusion.   

## 2015-03-21 NOTE — Progress Notes (Signed)
Patient is here for chemotherapy.  She requests an appointment with me today to review her MRI of L-spine.  I printed the report and gave it to the patient.  At this time, symptom management only is indicated.  Given her Stage IV disease, she is not a surgical candidate at this time.  I would still want her to have her CT pelvis tomorrow to rule out possible other causes of pain.  Return as scheduled.  Patient and plan discussed with Dr. Ancil Linsey and she is in agreement with the aforementioned.   Michaelah Credeur, PA-C 03/21/2015 1:28 PM

## 2015-03-21 NOTE — Patient Instructions (Signed)
[facility] Discharge Instructions for Patients Receiving Chemotherapy  Today you received the following chemotherapy agents:  Abraxane and Gemzar  If you develop nausea and vomiting, or diarrhea that is not controlled by your medication, call the clinic.  The clinic phone number is [phone]. Office hours are Monday-Friday 8:30am-5:00pm.  BELOW ARE SYMPTOMS THAT SHOULD BE REPORTED IMMEDIATELY:  *FEVER GREATER THAN 101.0 F  *CHILLS WITH OR WITHOUT FEVER  NAUSEA AND VOMITING THAT IS NOT CONTROLLED WITH YOUR NAUSEA MEDICATION  *UNUSUAL SHORTNESS OF BREATH  *UNUSUAL BRUISING OR BLEEDING  TENDERNESS IN MOUTH AND THROAT WITH OR WITHOUT PRESENCE OF ULCERS  *URINARY PROBLEMS  *BOWEL PROBLEMS  UNUSUAL RASH Items with * indicate a potential emergency and should be followed up as soon as possible. If you have an emergency after office hours please contact your primary care physician or go to the nearest emergency department.  Please call the clinic during office hours if you have any questions or concerns.   You may also contact the Patient Navigator at [phone] should you have any questions or need assistance in obtaining follow up care. _____________________________________________________________________ Have you asked about our STAR program?    STAR stands for Survivorship Training and Rehabilitation, and this is a nationally recognized cancer care program that focuses on survivorship and rehabilitation.  Cancer and cancer treatments may cause problems, such as, pain, making you feel tired and keeping you from doing the things that you need or want to do. Cancer rehabilitation can help. Our goal is to reduce these troubling effects and help you have the best quality of life possible.  You may receive a survey from a nurse that asks questions about your current state of health.  Based on the survey results, all eligible patients will be referred to the STAR  program for an evaluation so we can better serve you! A frequently asked questions sheet is available upon request.           

## 2015-03-22 ENCOUNTER — Ambulatory Visit (HOSPITAL_COMMUNITY)
Admission: RE | Admit: 2015-03-22 | Discharge: 2015-03-22 | Disposition: A | Discharge status: Home / Self Care | Payer: Medicare Other | Source: Ambulatory Visit | Attending: Oncology | Admitting: Oncology

## 2015-03-22 ENCOUNTER — Other Ambulatory Visit (HOSPITAL_COMMUNITY): Payer: Self-pay

## 2015-03-22 DIAGNOSIS — C259 Malignant neoplasm of pancreas, unspecified: Secondary | ICD-10-CM | POA: Insufficient documentation

## 2015-03-22 DIAGNOSIS — M5417 Radiculopathy, lumbosacral region: Secondary | ICD-10-CM

## 2015-03-22 DIAGNOSIS — M25551 Pain in right hip: Secondary | ICD-10-CM | POA: Insufficient documentation

## 2015-03-22 DIAGNOSIS — Z79899 Other long term (current) drug therapy: Secondary | ICD-10-CM | POA: Diagnosis not present

## 2015-03-22 LAB — CANCER ANTIGEN 19-9: CA 19-9: 19 U/mL (ref 0–35)

## 2015-03-22 MED ORDER — IOHEXOL 300 MG/ML  SOLN
100.0000 mL | Freq: Once | INTRAMUSCULAR | Status: AC | PRN
  Administered 2015-03-22: 100 mL via INTRAVENOUS

## 2015-03-23 DIAGNOSIS — H2513 Age-related nuclear cataract, bilateral: Secondary | ICD-10-CM | POA: Diagnosis not present

## 2015-03-23 DIAGNOSIS — H40033 Anatomical narrow angle, bilateral: Secondary | ICD-10-CM | POA: Diagnosis not present

## 2015-03-27 ENCOUNTER — Encounter: Payer: Self-pay | Admitting: Internal Medicine

## 2015-03-28 ENCOUNTER — Encounter (HOSPITAL_BASED_OUTPATIENT_CLINIC_OR_DEPARTMENT_OTHER): Payer: Medicare Other | Admitting: Oncology

## 2015-03-28 ENCOUNTER — Encounter (HOSPITAL_BASED_OUTPATIENT_CLINIC_OR_DEPARTMENT_OTHER): Payer: Medicare Other

## 2015-03-28 VITALS — BP 183/75 | HR 63 | Temp 98.6°F | Resp 18

## 2015-03-28 DIAGNOSIS — Z5111 Encounter for antineoplastic chemotherapy: Secondary | ICD-10-CM | POA: Diagnosis not present

## 2015-03-28 DIAGNOSIS — C787 Secondary malignant neoplasm of liver and intrahepatic bile duct: Secondary | ICD-10-CM | POA: Diagnosis not present

## 2015-03-28 DIAGNOSIS — C259 Malignant neoplasm of pancreas, unspecified: Secondary | ICD-10-CM

## 2015-03-28 DIAGNOSIS — M79641 Pain in right hand: Secondary | ICD-10-CM

## 2015-03-28 DIAGNOSIS — M79661 Pain in right lower leg: Secondary | ICD-10-CM | POA: Diagnosis not present

## 2015-03-28 DIAGNOSIS — C252 Malignant neoplasm of tail of pancreas: Secondary | ICD-10-CM | POA: Diagnosis not present

## 2015-03-28 DIAGNOSIS — C801 Malignant (primary) neoplasm, unspecified: Secondary | ICD-10-CM | POA: Diagnosis not present

## 2015-03-28 LAB — COMPREHENSIVE METABOLIC PANEL
ALBUMIN: 3.9 g/dL (ref 3.5–5.0)
ALK PHOS: 65 U/L (ref 38–126)
ALT: 22 U/L (ref 14–54)
ANION GAP: 10 (ref 5–15)
AST: 22 U/L (ref 15–41)
BUN: 17 mg/dL (ref 6–20)
CHLORIDE: 105 mmol/L (ref 101–111)
CO2: 26 mmol/L (ref 22–32)
CREATININE: 0.81 mg/dL (ref 0.44–1.00)
Calcium: 8.8 mg/dL — ABNORMAL LOW (ref 8.9–10.3)
GFR calc Af Amer: >60 mL/min (ref >60)
GFR calc non Af Amer: >60 mL/min (ref >60)
Glucose, Bld: 112 mg/dL — ABNORMAL HIGH (ref 65–99)
POTASSIUM: 3.7 mmol/L (ref 3.5–5.1)
SODIUM: 141 mmol/L (ref 135–145)
Total Bilirubin: 0.8 mg/dL (ref 0.3–1.2)
Total Protein: 6.7 g/dL (ref 6.5–8.1)

## 2015-03-28 LAB — CBC WITH DIFFERENTIAL/PLATELET
BASOS PCT: 0 %
Basophils Absolute: 0 10*3/uL (ref 0.0–0.1)
EOS ABS: 0 10*3/uL (ref 0.0–0.7)
EOS PCT: 0 %
HCT: 31.2 % — ABNORMAL LOW (ref 36.0–46.0)
HEMOGLOBIN: 10.3 g/dL — AB (ref 12.0–15.0)
Lymphocytes Relative: 16 %
Lymphs Abs: 0.9 10*3/uL (ref 0.7–4.0)
MCH: 27.8 pg (ref 26.0–34.0)
MCHC: 33 g/dL (ref 30.0–36.0)
MCV: 84.1 fL (ref 78.0–100.0)
MONOS PCT: 2 %
Monocytes Absolute: 0.1 10*3/uL (ref 0.1–1.0)
NEUTROS PCT: 81 %
Neutro Abs: 4.5 10*3/uL (ref 1.7–7.7)
PLATELETS: 114 10*3/uL — AB (ref 150–400)
RBC: 3.71 MIL/uL — ABNORMAL LOW (ref 3.87–5.11)
RDW: 18.1 % — ABNORMAL HIGH (ref 11.5–15.5)
WBC: 5.5 10*3/uL (ref 4.0–10.5)

## 2015-03-28 MED ORDER — HEPARIN SOD (PORK) LOCK FLUSH 100 UNIT/ML IV SOLN
INTRAVENOUS | Status: AC
Start: 2015-03-28 — End: 2015-03-28
  Filled 2015-03-28: qty 5

## 2015-03-28 MED ORDER — GEMCITABINE HCL CHEMO INJECTION 1 GM/26.3ML
1000.0000 mg/m2 | Freq: Once | INTRAVENOUS | Status: AC
  Administered 2015-03-28: 1786 mg via INTRAVENOUS
  Filled 2015-03-28: qty 46.97

## 2015-03-28 MED ORDER — PACLITAXEL PROTEIN-BOUND CHEMO INJECTION 100 MG
125.0000 mg/m2 | Freq: Once | INTRAVENOUS | Status: AC
  Administered 2015-03-28: 225 mg via INTRAVENOUS
  Filled 2015-03-28: qty 45

## 2015-03-28 MED ORDER — SODIUM CHLORIDE 0.9 % IV SOLN
Freq: Once | INTRAVENOUS | Status: AC
  Administered 2015-03-28: 09:00:00 via INTRAVENOUS

## 2015-03-28 MED ORDER — SODIUM CHLORIDE 0.9 % IV SOLN: 8.0000 mg | Freq: Once | INTRAVENOUS | Status: DC

## 2015-03-28 MED ORDER — SODIUM CHLORIDE 0.9 % IV SOLN
Freq: Once | INTRAVENOUS | Status: AC
  Administered 2015-03-28: 8 mg via INTRAVENOUS
  Filled 2015-03-28: qty 4

## 2015-03-28 MED ORDER — SODIUM CHLORIDE 0.9 % IJ SOLN: 10.0000 mL | INTRAMUSCULAR | Status: DC | PRN

## 2015-03-28 MED ORDER — HEPARIN SOD (PORK) LOCK FLUSH 100 UNIT/ML IV SOLN
500.0000 [IU] | Freq: Once | INTRAVENOUS | Status: AC | PRN
  Administered 2015-03-28: 500 [IU]

## 2015-03-28 MED ORDER — DEXAMETHASONE SODIUM PHOSPHATE 10 MG/ML IJ SOLN: 10.0000 mg | Freq: Once | INTRAMUSCULAR | Status: DC

## 2015-03-28 NOTE — Progress Notes (Signed)
Patient is seen as a work-in after she witnessed me seeing another patient in follow-up.  She reports continued pain, particularly in her right lower extremity.  She notes that Hydrocodone is helpful.  She is also taking 400 mg of Advil 4-6 times per day.  She is advised to keep her Advil use to 400 mg BID at most.  She is not sure how well her pain is controlled without taking Advil as previously mentioned because she has been taking that dose for some time with the frequency.  I have asked her to rely more on the pain medication for now.  Additionally, she is advised not to take any additional tylenol as it is already in the Hydrocodone Rx.  She notes that her current pain regimen is beneficial at this time.  She will keep [place] informed regarding this particular issue.  She notes B/L LE edema that is not appreciated on exam.  She is encouraged to elevate her LE.  She notes a right hand hypothenar pain.  I cannot explain this discomfort from an oncology perspective and therefore, I have asked her to discuss this issue with her primary care provider.  I do not appreciate any atrophy, pain, or abnormality on palpation.  It is only unilateral.  She denies any olecranon issues.  Continue with treatment as planned.  Return as scheduled.   Patient and plan discussed with Dr. Ancil Linsey and she is in agreement with the aforementioned.   Robynn Pane, PA-C 03/28/2015 3:34 PM

## 2015-03-28 NOTE — Patient Instructions (Signed)
[facility] Discharge Instructions for Patients Receiving Chemotherapy  Today you received the following chemotherapy agents: Gemzar and Abraxane.      If you develop nausea and vomiting, or diarrhea that is not controlled by your medication, call the clinic.  The clinic phone number is [phone]. Office hours are Monday-Friday 8:30am-5:00pm.  BELOW ARE SYMPTOMS THAT SHOULD BE REPORTED IMMEDIATELY:  *FEVER GREATER THAN 101.0 F  *CHILLS WITH OR WITHOUT FEVER  NAUSEA AND VOMITING THAT IS NOT CONTROLLED WITH YOUR NAUSEA MEDICATION  *UNUSUAL SHORTNESS OF BREATH  *UNUSUAL BRUISING OR BLEEDING  TENDERNESS IN MOUTH AND THROAT WITH OR WITHOUT PRESENCE OF ULCERS  *URINARY PROBLEMS  *BOWEL PROBLEMS  UNUSUAL RASH Items with * indicate a potential emergency and should be followed up as soon as possible. If you have an emergency after office hours please contact your primary care physician or go to the nearest emergency department.  Please call the clinic during office hours if you have any questions or concerns.   You may also contact the Patient Navigator at [phone] should you have any questions or need assistance in obtaining follow up care.

## 2015-04-09 ENCOUNTER — Ambulatory Visit (HOSPITAL_COMMUNITY)
Admission: RE | Admit: 2015-04-09 | Discharge: 2015-04-09 | Disposition: A | Discharge status: Home / Self Care | Payer: Medicare Other | Source: Ambulatory Visit | Attending: Family Medicine | Admitting: Family Medicine

## 2015-04-09 ENCOUNTER — Telehealth: Payer: Self-pay | Admitting: Family Medicine

## 2015-04-09 ENCOUNTER — Other Ambulatory Visit: Payer: Self-pay | Admitting: Family Medicine

## 2015-04-09 ENCOUNTER — Telehealth (HOSPITAL_COMMUNITY): Payer: Self-pay | Admitting: *Deleted

## 2015-04-09 DIAGNOSIS — M1711 Unilateral primary osteoarthritis, right knee: Secondary | ICD-10-CM | POA: Diagnosis not present

## 2015-04-09 DIAGNOSIS — M25561 Pain in right knee: Secondary | ICD-10-CM

## 2015-04-09 NOTE — Telephone Encounter (Signed)
Line is still busy

## 2015-04-09 NOTE — Telephone Encounter (Signed)
I have ordered an xray of her knee, if this does not show major abn I will refer her to ortho of her choice for furhter eval, pls explain , let her know

## 2015-04-09 NOTE — Telephone Encounter (Signed)
Patient states that she is mainly having pain in right knee.  Was prescribed Hydrocodone by cancer center and has had a ct of pelvis and mri of lumbar.   States that pain medication does not relieve pain completely.  Please advise.  She is asking about an xray of right knee.

## 2015-04-09 NOTE — Telephone Encounter (Signed)
Attempted to reach patient - Line busy.    Will try again.

## 2015-04-09 NOTE — Telephone Encounter (Signed)
Patient aware and will go have xray today

## 2015-04-09 NOTE — Telephone Encounter (Signed)
Patient states that she has a pinched nerve in her right side, she is asking to see Dr. Moshe Cipro for pain in her right knee and lower back, please advise?

## 2015-04-09 NOTE — Telephone Encounter (Signed)
The role of her pain medication is for her back pain.  If she is taking her pain medication and it is working for her back pain, then a change in medication is not warranted.  What is the knee pain from?  Pain medication is not to eliminate all pain, but to make it more manageable.  If knee pain is not oncology related, I would defer to her primary care provider or I will see her as scheduled in ~10 days.  KEFALAS,THOMAS 04/09/2015 2:30 PM

## 2015-04-10 ENCOUNTER — Telehealth: Payer: Self-pay | Admitting: Family Medicine

## 2015-04-10 ENCOUNTER — Other Ambulatory Visit: Payer: Self-pay

## 2015-04-10 DIAGNOSIS — M25561 Pain in right knee: Secondary | ICD-10-CM

## 2015-04-10 NOTE — Telephone Encounter (Signed)
[name] is asking about knee xray results, please advise?

## 2015-04-10 NOTE — Telephone Encounter (Signed)
Patient aware of her lab results. 

## 2015-04-10 NOTE — Telephone Encounter (Signed)
Spoke with patient.  She will be contacting Dr. Moshe Cipro for advice as she had her go for xrays of her knee.

## 2015-04-11 ENCOUNTER — Telehealth: Payer: Self-pay | Admitting: Family Medicine

## 2015-04-11 MED ORDER — CHOLESTYRAMINE LIGHT 4 GM/DOSE PO POWD: ORAL | Status: DC

## 2015-04-11 NOTE — Telephone Encounter (Signed)
Patients care giver states that they need her cholestyramine light (PREVALITE) 4 GM/DOSE powder sent to [facility] this morning they want to pick up by 12:15, please advise?

## 2015-04-11 NOTE — Telephone Encounter (Signed)
Med refilled.

## 2015-04-12 ENCOUNTER — Ambulatory Visit (INDEPENDENT_AMBULATORY_CARE_PROVIDER_SITE_OTHER): Payer: Medicare Other | Admitting: Family Medicine

## 2015-04-12 ENCOUNTER — Encounter: Payer: Self-pay | Admitting: Family Medicine

## 2015-04-12 VITALS — BP 138/60 | HR 56 | Temp 98.6°F | Resp 14 | Ht 63.5 in | Wt 150.0 lb

## 2015-04-12 DIAGNOSIS — C259 Malignant neoplasm of pancreas, unspecified: Secondary | ICD-10-CM

## 2015-04-12 DIAGNOSIS — M549 Dorsalgia, unspecified: Secondary | ICD-10-CM

## 2015-04-12 DIAGNOSIS — I10 Essential (primary) hypertension: Secondary | ICD-10-CM

## 2015-04-12 DIAGNOSIS — F419 Anxiety disorder, unspecified: Secondary | ICD-10-CM

## 2015-04-12 DIAGNOSIS — M25569 Pain in unspecified knee: Secondary | ICD-10-CM | POA: Diagnosis not present

## 2015-04-12 DIAGNOSIS — M25561 Pain in right knee: Secondary | ICD-10-CM

## 2015-04-12 DIAGNOSIS — C787 Secondary malignant neoplasm of liver and intrahepatic bile duct: Secondary | ICD-10-CM

## 2015-04-12 DIAGNOSIS — R05 Cough: Secondary | ICD-10-CM

## 2015-04-12 DIAGNOSIS — R058 Other specified cough: Secondary | ICD-10-CM

## 2015-04-12 MED ORDER — GABAPENTIN 100 MG PO CAPS: 100.0000 mg | ORAL_CAPSULE | Freq: Every day | ORAL | Status: DC

## 2015-04-12 MED ORDER — HYDROCODONE-ACETAMINOPHEN 5-325 MG PO TABS: ORAL_TABLET | ORAL | Status: DC

## 2015-04-12 MED ORDER — KETOROLAC TROMETHAMINE 60 MG/2ML IM SOLN
60.0000 mg | Freq: Once | INTRAMUSCULAR | Status: AC
  Administered 2015-04-12: 60 mg via INTRAMUSCULAR

## 2015-04-12 NOTE — Progress Notes (Signed)
   Subjective:    Patient ID: [name], female    DOB: 10-17-1928, 81 y.o.   MRN: [id number]  HPI Pt in with c/o uncontrolled right knee pain. There is no h/o trauma, and she denies buckling of the knee Recent x ray of the knee pales in comparison to her symptom of level 10 pain, which keeps her awake. Denies bowel or bladder incontinence, pain radiates from low back , down buttock and posterior thigh  And leg. She had recently been prescribed hydrocodone by oncology , and states she is out of medication as she is so miserable because of pain    Review of Systems See HPI Denies recent fever or chills. Denies sinus pressure, nasal congestion, ear pain or sore throat. Denies chest congestion, productive cough or wheezing. Denies chest pains, palpitations and leg swelling Denies abdominal pain, nausea, vomiting,diarrhea or constipation.   Denies dysuria, frequency, hesitancy or incontinence.  Denies headaches, seizures, numbness, or tingling. Denies skin break down or rash.        Objective:   Physical Exam BP 138/60 mmHg  Pulse 56  Temp(Src) 98.6 F (37 C)  Resp 14  Ht 5' 3.5" (1.613 m)  Wt 150 lb (68.04 kg)  BMI 26.15 kg/m2  SpO2 98%  Patient alert and oriented and in no cardiopulmonary distress.Pt in pain  HEENT: No facial asymmetry, EOMI,   oropharynx pink and moist.  Neck supple no JVD, no mass.  Chest: Clear to auscultation bilaterally.  CVS: S1, S2 no murmurs, no S3.Regular rate.  ABD: Soft non tender.   Ext: No edema  MS: Decreased  ROM lumbar spine, normal in  shoulders, hips and knees.  Skin: Intact, no ulcerations or rash noted.  Psych: Good eye contact, normal affect. Memory intact  anxious onot depressed appearing.  CNS: CN 2-12 intact, power,  normal throughout.no focal deficits noted.        Assessment & Plan:  Back pain with radiation Uncontrolled, radiates to right lower extremity and knee. Recent mRI reviewed with pt, she has  significant disc disease in lower spine with nerve compression, will refer for epidural to see if this affords relief. Toradol in office , start gabapentin and pain contract entered for 3 times daily medication, will notify oncology clinic. F/U in 4 weeks. No lower extremity weakness or sensory deficit present currently, no incontinence reported  Essential hypertension Controlled, no change in medication   Anxiety Controlled, no change in medication   Allergic cough improved  Pancreatic cancer metastasized to liver Good response to treatment and under close oncology supervision/care

## 2015-04-12 NOTE — Patient Instructions (Addendum)
F/u in 4 weeks , call if you need me sooner  Toradol  injection in office today for back pain which radiates to right knee  You will receive pain medication on a regular basis from this office and a contract is to be signed also, take only as directed, one table three times daily  New additional bedtime medication for pain is gabapentin  You are referred for epidural injection in spine to see if this will help to relieve your pain. Arthritis in knee is minimal, the problem is your spine

## 2015-04-13 ENCOUNTER — Other Ambulatory Visit: Payer: Self-pay | Admitting: Family Medicine

## 2015-04-13 DIAGNOSIS — M549 Dorsalgia, unspecified: Secondary | ICD-10-CM

## 2015-04-14 DIAGNOSIS — M25561 Pain in right knee: Secondary | ICD-10-CM | POA: Insufficient documentation

## 2015-04-14 NOTE — Assessment & Plan Note (Signed)
Controlled, no change in medication  

## 2015-04-14 NOTE — Assessment & Plan Note (Signed)
improved

## 2015-04-14 NOTE — Assessment & Plan Note (Signed)
Good response to treatment and under close oncology supervision/care

## 2015-04-14 NOTE — Assessment & Plan Note (Signed)
Uncontrolled, radiates to right lower extremity and knee. Recent mRI reviewed with pt, she has significant disc disease in lower spine with nerve compression, will refer for epidural to see if this affords relief. Toradol in office , start gabapentin and pain contract entered for 3 times daily medication, will notify oncology clinic. F/U in 4 weeks. No lower extremity weakness or sensory deficit present currently, no incontinence reported

## 2015-04-16 ENCOUNTER — Ambulatory Visit (HOSPITAL_COMMUNITY): Payer: Medicare Other | Admitting: Oncology

## 2015-04-16 NOTE — Assessment & Plan Note (Addendum)
Stage IV Pancreatic Cancer, on systemic therapy with Abraxane and Gemzar in a day 1, 8 fashion every 28 days.  Continued response to therapy.  Oncology history is updated.   Patient has entered into a pain contract with her primary care provider for non-oncologic pain.  As a result, this is entered onto the patient's problem list and to avoid any breach of contract, we will cease from prescribing pain medication at this time.  If the patient's disease were to progress resulting in cancer-related pain, then we could re-evaluate the patient's pain medication needs.  She has been referred to appropriate specialties by Dr. Moshe Cipro.  She has upcoming appointments.  Pre-chemo labs as ordered.  Labs meet treatment parameters today.  Historically, she has been imaged every 3 months with a declining CA 19-9.  Her CA 19-9 is WNL, but is noted to increase last month.  If increase is noted again today, will set her up this month for MRI abd for staging.  If CA 19-9 is stable, will consider holding off on imaging for a later date.  Return in 28 days for follow-up.

## 2015-04-16 NOTE — Progress Notes (Signed)
[name] Nakayama, MD [address] [place] [address]  Pancreatic cancer metastasized to liver [facility])  Opioid contract exists  CURRENT THERAPY: Palliative Gemcitabine/Abraxane  INTERVAL HISTORY: [name] 81 y.o. female returns for followup of Stage IV pancreatic cancer.    Pancreatic cancer metastasized to liver ([place])   03/10/2014 Initial Diagnosis Pancreatic cancer metastasized to liver   03/22/2014 -  Chemotherapy Abraxane/Gemzar days 1, 8, every 28 days.  Day 15 was cancelled due to leukopenia and thrombocytopenia on day 15 cycle 1.   05/24/2014 Treatment Plan Change Day 8 of cycle 3 is held with ANC of 1.1   06/05/2014 Imaging CT C/A/P . Interval decrease in size of the pancreatic tail mass.Improved hepatic metastatic disease. No new lesions. No CT findings for metastatic disease involving the chest.   08/09/2014 Tumor Marker CA 19-9= 33 (WNL)   10/26/2014 Imaging MRI- Continued interval decrease in size of the hepatic metastatic lesions and no new lesions are identified. Continued decrease in size of the pancreatic tail lesion.   01/03/2015 Tumor Marker Results for [name], [name] (MRN [id number]) as of 01/18/2015 12:52  01/03/2015 10:00 CA 19-9: 14    01/17/2015 Imaging MRI- Response to therapy of hepatic metastasis.  Similar size of a pancreatic tail lesion.   03/20/2015 Imaging MRI-L spine- Severe disc space narrowing at L2-L3, with endplate reactive changes. Large disc extrusion into the ventral epidural space,central to the RIGHT with a cephalad migrated free fragment. Additional Large disc extrusion into the retroperitone...   03/22/2015 Imaging CT pelvis- No evidence for metastatic disease within the pelvis.    I personally reviewed and went over radiographic studies with the patient.  The results are noted within this dictation.  Past imaging to work-up the patient back pain is not oncology related.  These tests are added to her oncology history.  I  received a message from the patient's primary care provider, Dr. Moshe Cipro, that the patient was placed on a pain contract with her and she was referred to orthopedic surgeon for consideration of arthritis management with corticosteroid injections into affected joint, namely knee.  [name] is tolerating treatment well without any other complaints.  She denies any nausea and vomiting.  Her appetite is strong.  Her weight is stable.  She denies any abdominal pain.  She denies any jaundice.   Past Medical History  Diagnosis Date  . Hypertension 20 years   . Chronic diarrhea   . Anxiety   . Depression   . Kidney stone     hx/ crushed   . Hx of adenomatous colonic polyps     tubular adenomas, last found in 2008  . GERD (gastroesophageal reflux disease)   . Erosive esophagitis   . Schatzki's ring 08/26/10    Last dilated on EGD by Dr. Trevor [facility], linear gastric erosions, BI hemigastrectomy  . Hyperplastic colon polyp 03/19/10    tcs by Dr. Gala Romney  . Pancreatic cancer ([place]) 02/2014  . Pancreatic cancer metastasized to liver ([place]) 03/10/2014  . Opioid contract exists 04/18/2015    With Dr. Moshe Cipro    has Essential hypertension; GERD (gastroesophageal reflux disease); Anxiety; Primary generalized (osteo)arthritis; Back pain with radiation; IGT (impaired glucose tolerance); Anemia, iron deficiency; Elevated LFTs; Change in stool habits; Abnormal liver ultrasound; Pancreatic cancer metastasized to liver [facility]); Allergic cough; Loose stools; Allergic rhinitis; Cough; Heme positive stool; Knee pain, right; and Opioid contract exists on her problem list.     is  allergic to iohexol; aciphex; amlodipine besylate-valsartan; esomeprazole magnesium; omeprazole; penicillins; and ciprofloxacin.  Current Outpatient Prescriptions on File Prior to Visit  Medication Sig Dispense Refill  . aspirin 81 MG EC tablet Take 81 mg by mouth every morning.     Marland Kitchen azelastine (ASTELIN) 0.1 % nasal spray Place 2 sprays  into both nostrils 2 (two) times daily. Use in each nostril as directed 30 mL 12  . AZOR 10-40 MG tablet TAKE 1 TABLET DAILY 90 tablet 0  . cetirizine (ZYRTEC) 10 MG tablet Take 1 tablet (10 mg total) by mouth every morning. 30 tablet 3  . cholestyramine light (PREVALITE) 4 GM/DOSE powder TAKE 1 SCOOP BY MOUTH DAILY 239.4 g 3  . clonazePAM (KLONOPIN) 0.5 MG tablet Take 1 tablet (0.5 mg total) by mouth 2 (two) times daily as needed for anxiety. 60 tablet 3  . cyclobenzaprine (FLEXERIL) 10 MG tablet Take 1 tablet (10 mg total) by mouth 2 (two) times daily as needed for muscle spasms. 30 tablet 1  . cycloSPORINE (RESTASIS) 0.05 % ophthalmic emulsion Place 1 drop into both eyes 2 (two) times daily.    . ferrous sulfate 325 (65 FE) MG tablet Take 325 mg by mouth daily with breakfast.    . gabapentin (NEURONTIN) 100 MG capsule Take 1 capsule (100 mg total) by mouth at bedtime. 30 capsule 3  . HYDROcodone-acetaminophen (NORCO/VICODIN) 5-325 MG tablet One tablet every 8 hours for pain 90 tablet 0  . lidocaine-prilocaine (EMLA) cream Apply a quarter size amount to port site 1 hour prior to chemo. Do not rub in. Cover with plastic wrap. 30 g 3  . montelukast (SINGULAIR) 10 MG tablet Take 1 tablet (10 mg total) by mouth at bedtime. 30 tablet 3  . nebivolol (BYSTOLIC) 10 MG tablet Take 1 tablet (10 mg total) by mouth daily. 90 tablet 1  . pantoprazole (PROTONIX) 20 MG tablet Take 1 tablet (20 mg total) by mouth daily. 90 tablet 0  . potassium chloride (MICRO-K) 10 MEQ CR capsule Take 1 capsule (10 mEq total) by mouth 2 (two) times daily. 60 capsule 1  . predniSONE (DELTASONE) 5 MG tablet Take 1 tablet (5 mg total) by mouth daily with breakfast. 30 tablet 1  . Probiotic Product (HEALTHY COLON PO) Take 1 tablet by mouth every morning.     . benzonatate (TESSALON) 100 MG capsule Take 1 capsule (100 mg total) by mouth 2 (two) times daily as needed for cough. (Patient not taking: Reported on 02/21/2015) 20 capsule 0   . diphenhydrAMINE (BENADRYL) 50 MG tablet 50 mg PO 1 hour prior to CT scan (Patient not taking: Reported on 04/18/2015) 1 tablet 0  . fluticasone (FLONASE) 50 MCG/ACT nasal spray Place 2 sprays into both nostrils daily.    . naproxen (NAPROSYN) 500 MG tablet Take 500 mg by mouth daily.    . predniSONE (DELTASONE) 50 MG tablet Take 50 mg 13 hours before, 7 hours before, and 1 hour before CT scan (Patient not taking: Reported on 04/18/2015) 3 tablet 0  . predniSONE (STERAPRED UNI-PAK 21 TAB) 5 MG (21) TBPK tablet Use as directed (Patient not taking: Reported on 04/18/2015) 21 tablet 0  . prochlorperazine (COMPAZINE) 10 MG tablet Take 1 tablet (10 mg total) by mouth every 6 (six) hours as needed for nausea or vomiting. (Patient not taking: Reported on 02/21/2015) 60 tablet 2  . senna (SENOKOT) 8.6 MG tablet Take 1 tablet by mouth daily.     Current Facility-Administered Medications on File  Prior to Visit  Medication Dose Route Frequency Provider Last Rate Last Dose  . dexamethasone (DECADRON) 10 mg in sodium chloride 0.9 % 50 mL IVPB   Intravenous Once Patrici Ranks, MD      . gemcitabine (GEMZAR) 1,786 mg in sodium chloride 0.9 % 100 mL chemo infusion  1,000 mg/m2 (Treatment Plan Actual) Intravenous Once Patrici Ranks, MD      . heparin lock flush 100 unit/mL  500 Units Intracatheter Once PRN Patrici Ranks, MD      . PACLitaxel-protein bound (ABRAXANE) chemo infusion 225 mg  125 mg/m2 (Treatment Plan Actual) Intravenous Once Patrici Ranks, MD      . sodium chloride 0.9 % injection 10 mL  10 mL Intracatheter PRN Patrici Ranks, MD   10 mL at 04/18/15 1011    Past Surgical History  Procedure Laterality Date  . Stomach ulcer  50 years ago     had some of her stomach removed   . Bunion removal      from both feet   . Left shoulder surgery  2009    DR HARRISON  . Colonoscopy  03/19/2010    DR Gala Romney,, normal TI, pancolonic diverticula, random colon bx neg., hyperplastic polyps  removed  . Esophagogastroduodenoscopy  11/22/2003    DR Gala Romney, erosive RE, Billroth I  . Esophagogastroduodenoscopy  08/26/10    Dr. Gala Romney- moderate severe ERE, Scahtzki ring s/p dilation, Billroth I, linear gastric erosions, bx-gastric xanthelasma  . Breast lumpectomy Left   . Orif ankle fracture Right 05/22/2013    Procedure: OPEN REDUCTION INTERNAL FIXATION (ORIF) RIGHT ANKLE FRACTURE;  Surgeon: Sanjuana Kava, MD;  Location: [facility];  Service: Orthopedics;  Laterality: Right;  . Cholecystectomy  1965     Denies any headaches, dizziness, double vision, fevers, chills, night sweats, nausea, vomiting, diarrhea, constipation, chest pain, heart palpitations, shortness of breath, blood in stool, black tarry stool, urinary pain, urinary burning, urinary frequency, hematuria.   PHYSICAL EXAMINATION  ECOG PERFORMANCE STATUS: 2 - Symptomatic, <50% confined to bed  There were no vitals filed for this visit.  GENERAL:alert, well nourished, well developed, comfortable, cooperative, smiling and unaccompanied today in chemo-recliner.  SKIN: skin color, texture, turgor are normal, no rashes or significant lesions HEAD: Normocephalic, No masses, lesions, tenderness or abnormalities EYES: normal, PERRLA, EOMI, Conjunctiva are pink and non-injected EARS: External ears normal OROPHARYNX:lips, buccal mucosa, and tongue normal and mucous membranes are moist  NECK: supple, trachea midline LYMPH:  no palpable lymphadenopathy BREAST:not examined LUNGS: clear to auscultation  HEART: regular rate & rhythm ABDOMEN:abdomen soft and normal bowel sounds BACK: Back symmetric, no curvature. EXTREMITIES:less then 2 second capillary refill, no joint deformities, effusion, or inflammation, no skin discoloration, no cyanosis  NEURO: alert & oriented x 3 with fluent speech, no focal motor/sensory deficits.    LABORATORY DATA: CBC    Component Value Date/Time   WBC 7.3 04/18/2015 0843   RBC 4.00 04/18/2015 0843    HGB 11.3* 04/18/2015 0843   HCT 34.4* 04/18/2015 0843   PLT 229 04/18/2015 0843   MCV 86.0 04/18/2015 0843   MCH 28.3 04/18/2015 0843   MCHC 32.8 04/18/2015 0843   RDW 19.3* 04/18/2015 0843   LYMPHSABS 0.9 04/18/2015 0843   MONOABS 0.3 04/18/2015 0843   EOSABS 0.0 04/18/2015 0843   BASOSABS 0.0 04/18/2015 0843      Chemistry      Component Value Date/Time   NA 140 04/18/2015 0843   K 4.0  04/18/2015 0843   CL 106 04/18/2015 0843   CO2 28 04/18/2015 0843   BUN 20 04/18/2015 0843   CREATININE 0.90 04/18/2015 0843   CREATININE 0.97 02/14/2014 0758      Component Value Date/Time   CALCIUM 8.9 04/18/2015 0843   ALKPHOS 78 04/18/2015 0843   AST 16 04/18/2015 0843   ALT 16 04/18/2015 0843   BILITOT 0.7 04/18/2015 0843      PENDING LABS:   RADIOGRAPHIC STUDIES:  Ct Pelvis W Contrast  03/22/2015  CLINICAL DATA:  Patient with history of stage IV pancreatic cancer diagnosed 2015. On chemotherapy. New right hip pain. EXAM: CT PELVIS WITH CONTRAST TECHNIQUE: Multidetector CT imaging of the pelvis was performed using the standard protocol following the bolus administration of intravenous contrast. CONTRAST:  119mL OMNIPAQUE IOHEXOL 300 MG/ML  SOLN COMPARISON:  CT abdomen pelvis 07/19/2014 FINDINGS: Visualized distal abdominal aorta is normal in caliber with peripheral calcified atherosclerotic plaque. Uterus and adnexal structures are unremarkable. Trace free fluid in the pelvis. Urinary bladder is unremarkable. The visualized large and small bowel is unremarkable. No retroperitoneal or pelvic lymphadenopathy. Lumbar spine degenerative changes. No aggressive or acute appearing osseous lesions. L4-5 posterior disc osteophyte complex. IMPRESSION: No evidence for metastatic disease within the pelvis. Electronically Signed   By: Lovey Newcomer M.D.   On: 03/22/2015 15:08   Mr Lumbar Spine W Wo Contrast  03/20/2015  CLINICAL DATA:  Low back pain radiating to RIGHT hip for 1 week. Stage IV  pancreatic cancer. EXAM: MRI LUMBAR SPINE WITHOUT AND WITH CONTRAST TECHNIQUE: Multiplanar and multiecho pulse sequences of the lumbar spine were obtained without and with intravenous contrast. CONTRAST:  41mL MULTIHANCE GADOBENATE DIMEGLUMINE 529 MG/ML IV SOLN COMPARISON:  No prior MRI scans of the lumbar spine. There is some visualization of lumbar spine on MRI of the abdomen from 01/17/2015. There is also visualization lumbar spine on CT abdomen and pelvis 06/05/2014. FINDINGS: Segmentation: Normal. Alignment:  Reversal of the normal lumbar lordotic curve. Vertebrae: Chronic disc space narrowing at L2-3 and L3-4. Significant endplate reactive changes above and below L2-3 with bone marrow edema and postcontrast enhancement.This does not have the appearance of metastatic disease. Conus medullaris: Normal in size, signal, and location. Paraspinal tissues: No evidence for hydronephrosis or paravertebral mass. Pancreatic disease better evaluated on MR abdomen. Disc levels: L1-L2:  Normal. L2-L3: There is severe disc space narrowing at this level. Large disc extrusion extends into the retroperitoneum. Furthermore there is a large disc extrusion posteriorly into the ventral epidural space, central to the RIGHT with a cephalad migrated free fragment, superimposed posterior element hypertrophy, contributes to mild central canal stenosis. Subarticular zone narrowing could affect the RIGHT L3 nerve root. Clear-cut foraminal narrowing leading to neural impingement not established. L3-L4: Central disc extrusion. Mild canal stenosis. Disc material extends into the RIGHT neural foramen. RIGHT L3 greater than RIGHT L4 nerve root impingement is observed. L4-L5: Central disc extrusion. Mild posterior element hypertrophy. Mild central canal stenosis. Neural impingement not established. L5-S1: Disc space narrowing. Central and leftward protrusion extends into the foramen. Mild facet arthropathy. LEFT L5 nerve root impingement is  possible. Compared to the recent MR, the disc extrusion at L2-3 was present but appears worse on today's study. IMPRESSION: Severe disc space narrowing at L2-L3, with endplate reactive changes. Large disc extrusion into the ventral epidural space, central to the RIGHT with a cephalad migrated free fragment. Additional Large disc extrusion into the retroperitoneum contributes to disc space narrowing. Central disc extrusion L3-4.  RIGHT L3 and L4 nerve root impingement could be symptomatic. No compression fracture or osseous metastatic disease. Electronically Signed   By: Staci Righter M.D.   On: 03/20/2015 08:10   Dg Knee Complete 4 Views Right  04/09/2015  CLINICAL DATA:  RIGHT knee pain for 2 weeks, no known injury EXAM: RIGHT KNEE - COMPLETE 4+ VIEW COMPARISON:  None FINDINGS: Osseous demineralization. Diffuse joint space narrowing. Small marginal spurs at lateral compartment. No acute fracture, dislocation, or bone destruction. No knee joint effusion. Scattered atherosclerotic calcifications. IMPRESSION: Osseous demineralization with degenerative changes RIGHT knee. No acute abnormalities. Electronically Signed   By: Lavonia Dana M.D.   On: 04/09/2015 16:20     PATHOLOGY:    ASSESSMENT AND PLAN:  Pancreatic cancer metastasized to liver Stage IV Pancreatic Cancer, on systemic therapy with Abraxane and Gemzar in a day 1, 8 fashion every 28 days.  Continued response to therapy.  Oncology history is updated.   Patient has entered into a pain contract with her primary care provider for non-oncologic pain.  As a result, this is entered onto the patient's problem list and to avoid any breach of contract, we will cease from prescribing pain medication at this time.  If the patient's disease were to progress resulting in cancer-related pain, then we could re-evaluate the patient's pain medication needs.  She has been referred to appropriate specialties by Dr. Moshe Cipro.  She has upcoming  appointments.  Pre-chemo labs as ordered.  Labs meet treatment parameters today.  Historically, she has been imaged every 3 months with a declining CA 19-9.  Her CA 19-9 is WNL, but is noted to increase last month.  If increase is noted again today, will set her up this month for MRI abd for staging.  If CA 19-9 is stable, will consider holding off on imaging for a later date.  Return in 28 days for follow-up.    THERAPY PLAN:  Continue treatment as planned, unless imaging is concerning for progression of disease.  All questions were answered. The patient knows to call the clinic with any problems, questions or concerns. We can certainly see the patient much sooner if necessary.  Patient and plan discussed with Dr. Ancil Linsey and she is in agreement with the aforementioned.   This note is electronically signed by: Doy Mince 04/18/2015 10:42 AM

## 2015-04-18 ENCOUNTER — Encounter (HOSPITAL_BASED_OUTPATIENT_CLINIC_OR_DEPARTMENT_OTHER): Payer: Medicare Other | Admitting: Oncology

## 2015-04-18 ENCOUNTER — Encounter (HOSPITAL_COMMUNITY): Payer: Self-pay | Admitting: Oncology

## 2015-04-18 ENCOUNTER — Encounter (HOSPITAL_COMMUNITY): Payer: Medicare Other | Attending: Hematology and Oncology

## 2015-04-18 VITALS — BP 175/71 | HR 64 | Temp 97.9°F | Resp 18 | Wt 149.6 lb

## 2015-04-18 DIAGNOSIS — Z5111 Encounter for antineoplastic chemotherapy: Secondary | ICD-10-CM

## 2015-04-18 DIAGNOSIS — C801 Malignant (primary) neoplasm, unspecified: Secondary | ICD-10-CM | POA: Diagnosis not present

## 2015-04-18 DIAGNOSIS — C787 Secondary malignant neoplasm of liver and intrahepatic bile duct: Secondary | ICD-10-CM

## 2015-04-18 DIAGNOSIS — C252 Malignant neoplasm of tail of pancreas: Secondary | ICD-10-CM | POA: Diagnosis not present

## 2015-04-18 DIAGNOSIS — M2550 Pain in unspecified joint: Secondary | ICD-10-CM | POA: Diagnosis not present

## 2015-04-18 DIAGNOSIS — C259 Malignant neoplasm of pancreas, unspecified: Secondary | ICD-10-CM | POA: Diagnosis not present

## 2015-04-18 DIAGNOSIS — Z79891 Long term (current) use of opiate analgesic: Secondary | ICD-10-CM | POA: Insufficient documentation

## 2015-04-18 HISTORY — DX: Long term (current) use of opiate analgesic: Z79.891

## 2015-04-18 LAB — CBC WITH DIFFERENTIAL/PLATELET
BASOS ABS: 0 10*3/uL (ref 0.0–0.1)
Basophils Relative: 0 %
EOS PCT: 0 %
Eosinophils Absolute: 0 10*3/uL (ref 0.0–0.7)
HEMATOCRIT: 34.4 % — AB (ref 36.0–46.0)
HEMOGLOBIN: 11.3 g/dL — AB (ref 12.0–15.0)
LYMPHS ABS: 0.9 10*3/uL (ref 0.7–4.0)
Lymphocytes Relative: 13 %
MCH: 28.3 pg (ref 26.0–34.0)
MCHC: 32.8 g/dL (ref 30.0–36.0)
MCV: 86 fL (ref 78.0–100.0)
MONOS PCT: 4 %
Monocytes Absolute: 0.3 10*3/uL (ref 0.1–1.0)
Neutro Abs: 6 10*3/uL (ref 1.7–7.7)
Neutrophils Relative %: 83 %
Platelets: 229 10*3/uL (ref 150–400)
RBC: 4 MIL/uL (ref 3.87–5.11)
RDW: 19.3 % — ABNORMAL HIGH (ref 11.5–15.5)
WBC: 7.3 10*3/uL (ref 4.0–10.5)

## 2015-04-18 LAB — COMPREHENSIVE METABOLIC PANEL
ALK PHOS: 78 U/L (ref 38–126)
ALT: 16 U/L (ref 14–54)
AST: 16 U/L (ref 15–41)
Albumin: 4 g/dL (ref 3.5–5.0)
Anion gap: 6 (ref 5–15)
BILIRUBIN TOTAL: 0.7 mg/dL (ref 0.3–1.2)
BUN: 20 mg/dL (ref 6–20)
CO2: 28 mmol/L (ref 22–32)
CREATININE: 0.9 mg/dL (ref 0.44–1.00)
Calcium: 8.9 mg/dL (ref 8.9–10.3)
Chloride: 106 mmol/L (ref 101–111)
GFR calc Af Amer: >60 mL/min (ref >60)
GFR, EST NON AFRICAN AMERICAN: 57 mL/min — AB (ref >60)
Glucose, Bld: 128 mg/dL — ABNORMAL HIGH (ref 65–99)
Potassium: 4 mmol/L (ref 3.5–5.1)
Sodium: 140 mmol/L (ref 135–145)
TOTAL PROTEIN: 6.8 g/dL (ref 6.5–8.1)

## 2015-04-18 MED ORDER — DEXAMETHASONE SODIUM PHOSPHATE 10 MG/ML IJ SOLN: 10.0000 mg | Freq: Once | INTRAMUSCULAR | Status: DC

## 2015-04-18 MED ORDER — SODIUM CHLORIDE 0.9 % IV SOLN
Freq: Once | INTRAVENOUS | Status: AC
  Administered 2015-04-18: 8 mg via INTRAVENOUS
  Filled 2015-04-18: qty 4

## 2015-04-18 MED ORDER — HEPARIN SOD (PORK) LOCK FLUSH 100 UNIT/ML IV SOLN
500.0000 [IU] | Freq: Once | INTRAVENOUS | Status: AC | PRN
Start: 2015-04-18 — End: 2015-04-18
  Administered 2015-04-18: 500 [IU]

## 2015-04-18 MED ORDER — PACLITAXEL PROTEIN-BOUND CHEMO INJECTION 100 MG
125.0000 mg/m2 | Freq: Once | INTRAVENOUS | Status: AC
  Administered 2015-04-18: 225 mg via INTRAVENOUS
  Filled 2015-04-18: qty 45

## 2015-04-18 MED ORDER — SODIUM CHLORIDE 0.9 % IV SOLN: 8.0000 mg | Freq: Once | INTRAVENOUS | Status: DC

## 2015-04-18 MED ORDER — SODIUM CHLORIDE 0.9 % IV SOLN
1000.0000 mg/m2 | Freq: Once | INTRAVENOUS | Status: AC
  Administered 2015-04-18: 1786 mg via INTRAVENOUS
  Filled 2015-04-18: qty 46.97

## 2015-04-18 MED ORDER — HEPARIN SOD (PORK) LOCK FLUSH 100 UNIT/ML IV SOLN
INTRAVENOUS | Status: AC
  Filled 2015-04-18: qty 5

## 2015-04-18 MED ORDER — SODIUM CHLORIDE 0.9 % IV SOLN
Freq: Once | INTRAVENOUS | Status: AC
  Administered 2015-04-18: 10:00:00 via INTRAVENOUS

## 2015-04-18 MED ORDER — SODIUM CHLORIDE 0.9 % IJ SOLN
10.0000 mL | INTRAMUSCULAR | Status: DC | PRN
  Administered 2015-04-18: 10 mL
  Filled 2015-04-18: qty 10

## 2015-04-18 NOTE — Patient Instructions (Signed)
[facility] Discharge Instructions for Patients Receiving Chemotherapy  Today you received the following chemotherapy agents Abraxane and Gemzar.  To help prevent nausea and vomiting after your treatment, we encourage you to take your nausea medication as instructed.   If you develop nausea and vomiting that is not controlled by your nausea medication, call the clinic. If it is after clinic hours your family physician or the after hours number for the clinic or go to the Emergency Department.  BELOW ARE SYMPTOMS THAT SHOULD BE REPORTED IMMEDIATELY:  *FEVER GREATER THAN 101.0 F  *CHILLS WITH OR WITHOUT FEVER  NAUSEA AND VOMITING THAT IS NOT CONTROLLED WITH YOUR NAUSEA MEDICATION  *UNUSUAL SHORTNESS OF BREATH  *UNUSUAL BRUISING OR BLEEDING  TENDERNESS IN MOUTH AND THROAT WITH OR WITHOUT PRESENCE OF ULCERS  *URINARY PROBLEMS  *BOWEL PROBLEMS  UNUSUAL RASH Items with * indicate a potential emergency and should be followed up as soon as possible.  Return as scheduled.  I have been informed and understand all the instructions given to me. I know to contact the clinic, my physician, or go to the Emergency Department if any problems should occur. I do not have any questions at this time, but understand that I may call the clinic during office hours or the Patient Navigator at [phone] should I have any questions or need assistance in obtaining follow up care.    __________________________________________  _____________  __________ Signature of Patient or Authorized Representative            Date                   Time    __________________________________________ Nurse's Signature  

## 2015-04-18 NOTE — Progress Notes (Signed)
Tolerated chemo well. Ambulatory on discharge home with family. 

## 2015-04-18 NOTE — Patient Instructions (Signed)
..  [place] [facility] Discharge Instructions  RECOMMENDATIONS MADE BY THE CONSULTANT AND ANY TEST RESULTS WILL BE SENT TO YOUR REFERRING PHYSICIAN.  Treatment today Dr. Moshe Cipro will continue to write for your pain medicine Orthopedic appt as scheduled  Return for day 8 treatment next week Return in 4 weeks for follow up  Thank you for choosing [place] [facility] to provide your oncology and hematology care.  To afford each patient quality time with our provider, please arrive at least 15 minutes before your scheduled appointment time.    You need to re-schedule your appointment should you arrive 10 or more minutes late.  We strive to give you quality time with our providers, and arriving late affects you and other patients whose appointments are after yours.  Also, if you no show three or more times for appointments you may be dismissed from the clinic at the providers discretion.     Again, thank you for choosing [facility].  Our hope is that these requests will decrease the amount of time that you wait before being seen by our physicians.       _____________________________________________________________  Should you have questions after your visit to [facility], please contact our office at [phone] between the hours of 8:30 a.m. and 4:30 p.m.  Voicemails left after 4:30 p.m. will not be returned until the following business day.  For prescription refill requests, have your pharmacy contact our office.

## 2015-04-19 ENCOUNTER — Ambulatory Visit
Admission: RE | Admit: 2015-04-19 | Discharge: 2015-04-19 | Disposition: A | Discharge status: Home / Self Care | Payer: Medicare Other | Source: Ambulatory Visit | Attending: Family Medicine | Admitting: Family Medicine

## 2015-04-19 DIAGNOSIS — M549 Dorsalgia, unspecified: Secondary | ICD-10-CM

## 2015-04-19 DIAGNOSIS — M545 Low back pain: Secondary | ICD-10-CM | POA: Diagnosis not present

## 2015-04-19 LAB — CANCER ANTIGEN 19-9: CA 19 9: 16 U/mL (ref 0–35)

## 2015-04-19 MED ORDER — DIPHENHYDRAMINE HCL 50 MG PO CAPS
50.0000 mg | ORAL_CAPSULE | Freq: Once | ORAL | Status: AC
  Administered 2015-04-19: 50 mg via ORAL

## 2015-04-19 MED ORDER — IOHEXOL 180 MG/ML  SOLN
1.0000 mL | Freq: Once | INTRAMUSCULAR | Status: AC | PRN
  Administered 2015-04-19: 1 mL via EPIDURAL

## 2015-04-19 MED ORDER — METHYLPREDNISOLONE ACETATE 40 MG/ML INJ SUSP (RADIOLOG
120.0000 mg | Freq: Once | INTRAMUSCULAR | Status: AC
  Administered 2015-04-19: 120 mg via EPIDURAL

## 2015-04-19 NOTE — Discharge Instructions (Signed)

## 2015-04-24 ENCOUNTER — Ambulatory Visit (INDEPENDENT_AMBULATORY_CARE_PROVIDER_SITE_OTHER): Payer: Medicare Other | Admitting: Gastroenterology

## 2015-04-24 ENCOUNTER — Encounter: Payer: Self-pay | Admitting: Gastroenterology

## 2015-04-24 VITALS — BP 131/69 | HR 63 | Temp 98.3°F | Ht 63.0 in | Wt 150.4 lb

## 2015-04-24 DIAGNOSIS — Z86010 Personal history of colonic polyps: Secondary | ICD-10-CM | POA: Diagnosis not present

## 2015-04-24 NOTE — Progress Notes (Signed)
Primary Care Physician:  [name], MD  Primary Gastroenterologist:  Garfield Cornea, MD   Chief Complaint  Patient presents with  . Colonoscopy    HPI:  [name] is a 81 y.o. female with Stage IV pancreatic cancer, on chemotherapy who presents as result of a letter received for surveillance TCS. H/O adenomatous colon polyps. Last TCS 2011 with hyperplastic polyps.    BM once to twice per day, on questran for chronic diarrhea. Dark stool. No brbpr. Heartburn controlled. No dysphagia. No abdominal pain.  Patient not interested if not needed. TCS.    Current Outpatient Prescriptions  Medication Sig Dispense Refill  . aspirin 81 MG EC tablet Take 81 mg by mouth every morning.     Marland Kitchen azelastine (ASTELIN) 0.1 % nasal spray Place 2 sprays into both nostrils 2 (two) times daily. Use in each nostril as directed 30 mL 12  . AZOR 10-40 MG tablet TAKE 1 TABLET DAILY 90 tablet 0  . benzonatate (TESSALON) 100 MG capsule Take 1 capsule (100 mg total) by mouth 2 (two) times daily as needed for cough. 20 capsule 0  . cetirizine (ZYRTEC) 10 MG tablet Take 1 tablet (10 mg total) by mouth every morning. 30 tablet 3  . cholestyramine light (PREVALITE) 4 GM/DOSE powder TAKE 1 SCOOP BY MOUTH DAILY 239.4 g 3  . clonazePAM (KLONOPIN) 0.5 MG tablet Take 1 tablet (0.5 mg total) by mouth 2 (two) times daily as needed for anxiety. 60 tablet 3  . cyclobenzaprine (FLEXERIL) 10 MG tablet Take 1 tablet (10 mg total) by mouth 2 (two) times daily as needed for muscle spasms. 30 tablet 1  . cycloSPORINE (RESTASIS) 0.05 % ophthalmic emulsion Place 1 drop into both eyes 2 (two) times daily.    . diphenhydrAMINE (BENADRYL) 50 MG tablet 50 mg PO 1 hour prior to CT scan 1 tablet 0  . ferrous sulfate 325 (65 FE) MG tablet Take 325 mg by mouth daily with breakfast.    . fluticasone (FLONASE) 50 MCG/ACT nasal spray Place 2 sprays into both nostrils daily.    Marland Kitchen gabapentin (NEURONTIN) 100 MG capsule Take 1 capsule (100  mg total) by mouth at bedtime. 30 capsule 3  . HYDROcodone-acetaminophen (NORCO/VICODIN) 5-325 MG tablet One tablet every 8 hours for pain 90 tablet 0  . lidocaine-prilocaine (EMLA) cream Apply a quarter size amount to port site 1 hour prior to chemo. Do not rub in. Cover with plastic wrap. 30 g 3  . montelukast (SINGULAIR) 10 MG tablet Take 1 tablet (10 mg total) by mouth at bedtime. 30 tablet 3  . naproxen (NAPROSYN) 500 MG tablet Take 500 mg by mouth daily.    . nebivolol (BYSTOLIC) 10 MG tablet Take 1 tablet (10 mg total) by mouth daily. 90 tablet 1  . Omega-3 Fatty Acids (OMEGA 3 PO) Take by mouth.    . pantoprazole (PROTONIX) 20 MG tablet Take 1 tablet (20 mg total) by mouth daily. 90 tablet 0  . potassium chloride (MICRO-K) 10 MEQ CR capsule Take 1 capsule (10 mEq total) by mouth 2 (two) times daily. 60 capsule 1  . predniSONE (DELTASONE) 5 MG tablet Take 1 tablet (5 mg total) by mouth daily with breakfast. 30 tablet 1  . predniSONE (DELTASONE) 50 MG tablet Take 50 mg 13 hours before, 7 hours before, and 1 hour before CT scan 3 tablet 0  . predniSONE (STERAPRED UNI-PAK 21 TAB) 5 MG (21) TBPK tablet Use as directed 21 tablet 0  .  Probiotic Product (HEALTHY COLON PO) Take 1 tablet by mouth every morning.     . prochlorperazine (COMPAZINE) 10 MG tablet Take 1 tablet (10 mg total) by mouth every 6 (six) hours as needed for nausea or vomiting. 60 tablet 2  . senna (SENOKOT) 8.6 MG tablet Take 1 tablet by mouth daily.     No current facility-administered medications for this visit.   Facility-Administered Medications Ordered in Other Visits  Medication Dose Route Frequency Provider Last Rate Last Dose  . dexamethasone (DECADRON) 10 mg in sodium chloride 0.9 % 50 mL IVPB   Intravenous Once Patrici Ranks, MD        Allergies as of 04/24/2015 - Review Complete 04/24/2015  Allergen Reaction Noted  . Iohexol Hives and Shortness Of Breath 06/05/2014  . Aciphex [rabeprazole sodium] Other (See  Comments) 09/03/2010  . Amlodipine besylate-valsartan  01/07/2010  . Esomeprazole magnesium  10/02/2010  . Omeprazole Other (See Comments) 10/02/2010  . Penicillins Other (See Comments) 02/17/2007  . Ciprofloxacin Rash 08/18/2011    Past Medical History  Diagnosis Date  . Hypertension 20 years   . Chronic diarrhea   . Anxiety   . Depression   . Kidney stone     hx/ crushed   . Hx of adenomatous colonic polyps     tubular adenomas, last found in 2008  . GERD (gastroesophageal reflux disease)   . Erosive esophagitis   . Schatzki's ring 08/26/10    Last dilated on EGD by Dr. Trevor Iha [facility], linear gastric erosions, BI hemigastrectomy  . Hyperplastic colon polyp 03/19/10    tcs by Dr. Gala Romney  . Pancreatic cancer ([place]) 02/2014  . Pancreatic cancer metastasized to liver ([place]) 03/10/2014  . Opioid contract exists 04/18/2015    With Dr. Moshe Cipro    Past Surgical History  Procedure Laterality Date  . Stomach ulcer  50 years ago     had some of her stomach removed   . Bunion removal      from both feet   . Left shoulder surgery  2009    DR HARRISON  . Colonoscopy  03/19/2010    DR Gala Romney,, normal TI, pancolonic diverticula, random colon bx neg., hyperplastic polyps removed  . Esophagogastroduodenoscopy  11/22/2003    DR Gala Romney, erosive RE, Billroth I  . Esophagogastroduodenoscopy  08/26/10    Dr. Gala Romney- moderate severe ERE, Scahtzki ring s/p dilation, Billroth I, linear gastric erosions, bx-gastric xanthelasma  . Breast lumpectomy Left   . Orif ankle fracture Right 05/22/2013    Procedure: OPEN REDUCTION INTERNAL FIXATION (ORIF) RIGHT ANKLE FRACTURE;  Surgeon: Sanjuana Kava, MD;  Location: [facility];  Service: Orthopedics;  Laterality: Right;  . Cholecystectomy  1965     Family History  Problem Relation Age of Onset  . Hypertension Mother   . Kidney failure Brother     X1 ON DIALYSIS  . Diabetes Sister   . Cancer Sister     X2  . Hypertension Father   . Liver disease Neg Hx   .  Colon cancer Neg Hx     Social History   Social History  . Marital Status: Widowed    Spouse Name: N/A  . Number of Children: 2  . Years of Education: N/A   Occupational History  . retired from Teacher, adult education    Social History Main Topics  . Smoking status: Former Smoker    Types: Cigarettes    Quit date: 11/09/2011  . Smokeless tobacco: Never Used  Comment: quit a few weeks ago   . Alcohol Use: No  . Drug Use: No  . Sexual Activity: No   Other Topics Concern  . Not on file   Social History Narrative      ROS:  General: Negative for anorexia, weight loss, fever, chills, fatigue, weakness. Eyes: Negative for vision changes.  ENT: Negative for hoarseness, difficulty swallowing , nasal congestion. CV: Negative for chest pain, angina, palpitations, dyspnea on exertion, peripheral edema.  Respiratory: Negative for dyspnea at rest, dyspnea on exertion, cough, sputum, wheezing.  GI: See history of present illness. GU:  Negative for dysuria, hematuria, urinary incontinence, urinary frequency, nocturnal urination.  MS: Negative for joint pain, low back pain.  Derm: Negative for rash or itching.  Neuro: Negative for weakness, abnormal sensation, seizure, frequent headaches, memory loss, confusion.  Psych: Negative for anxiety, depression, suicidal ideation, hallucinations.  Endo: Negative for unusual weight change.  Heme: Negative for bruising or bleeding. Allergy: Negative for rash or hives.    Physical Examination:  BP 131/69 mmHg  Pulse 63  Temp(Src) 98.3 F (36.8 C) (Oral)  Ht 5\' 3"  (1.6 m)  Wt 150 lb 6.4 oz (68.221 kg)  BMI 26.65 kg/m2   General: Well-nourished, well-developed in no acute distress.  Head: Normocephalic, atraumatic.   Eyes: Conjunctiva pink, no icterus. Mouth: Oropharyngeal mucosa moist and pink , no lesions erythema or exudate. Neck: Supple without thyromegaly, masses, or lymphadenopathy.  Lungs: Clear to auscultation bilaterally.  Heart:  Regular rate and rhythm, no murmurs rubs or gallops.  Abdomen: Bowel sounds are normal, nontender, nondistended, no hepatosplenomegaly or masses, no abdominal bruits or    hernia , no rebound or guarding.   Rectal: not performed Extremities: No lower extremity edema. No clubbing or deformities.  Neuro: Alert and oriented x 4 , grossly normal neurologically.  Skin: Warm and dry, no rash or jaundice.   Psych: Alert and cooperative, normal mood and affect.  Labs: Lab Results  Component Value Date   WBC 5.2 04/25/2015   HGB 10.8* 04/25/2015   HCT 32.8* 04/25/2015   MCV 85.9 04/25/2015   PLT 110* 04/25/2015   Lab Results  Component Value Date   CREATININE 0.81 04/25/2015   BUN 25* 04/25/2015   NA 140 04/25/2015   K 4.3 04/25/2015   CL 105 04/25/2015   CO2 29 04/25/2015   Lab Results  Component Value Date   ALT 24 04/25/2015   AST 20 04/25/2015   ALKPHOS 62 04/25/2015   BILITOT 0.9 04/25/2015     Imaging Studies: Dg Epidurography  04/19/2015  CLINICAL DATA:  Low back pain with radiation into the right lower extremity. Right lower extremity radiculitis. Displacement of the lumbar disc at multiple levels. FLUOROSCOPY TIME:  0.184 Gy*cm2 PROCEDURE: The procedure, risks, benefits, and alternatives were explained to the patient. Questions regarding the procedure were encouraged and answered. The patient understands and consents to the procedure. LUMBAR EPIDURAL INJECTION: An interlaminar approach was performed on right at L3-4. The overlying skin was cleansed and anesthetized. A 20 gauge Crawford epidural needle was advanced using loss-of-resistance technique. DIAGNOSTIC EPIDURAL INJECTION: Injection of Isovue-M 200 shows a good epidural pattern with spread above and below the level of needle placement, primarily on the right no vascular opacification is seen. THERAPEUTIC EPIDURAL INJECTION: 120 Mg of Depo-Medrol mixed with 3 mL 1% lidocaine were instilled. The procedure was well-tolerated,  and the patient was discharged thirty minutes following the injection in good condition. COMPLICATIONS: None IMPRESSION: Technically  successful epidural injection on the right L3-4 # 1 Electronically Signed   By: San Morelle M.D.   On: 04/19/2015 12:13   Dg Knee Complete 4 Views Right  04/09/2015  CLINICAL DATA:  RIGHT knee pain for 2 weeks, no known injury EXAM: RIGHT KNEE - COMPLETE 4+ VIEW COMPARISON:  None FINDINGS: Osseous demineralization. Diffuse joint space narrowing. Small marginal spurs at lateral compartment. No acute fracture, dislocation, or bone destruction. No knee joint effusion. Scattered atherosclerotic calcifications. IMPRESSION: Osseous demineralization with degenerative changes RIGHT knee. No acute abnormalities. Electronically Signed   By: Lavonia Dana M.D.   On: 04/09/2015 16:20

## 2015-04-24 NOTE — Patient Instructions (Signed)
1. I will discuss with Dr. Gala Romney but I don't think he would advise any further surveillance colonoscopies now or in the future. Further recommendations to follow.

## 2015-04-25 ENCOUNTER — Encounter (HOSPITAL_BASED_OUTPATIENT_CLINIC_OR_DEPARTMENT_OTHER): Payer: Medicare Other

## 2015-04-25 VITALS — BP 161/69 | HR 64 | Temp 98.1°F | Resp 16

## 2015-04-25 DIAGNOSIS — C787 Secondary malignant neoplasm of liver and intrahepatic bile duct: Secondary | ICD-10-CM | POA: Diagnosis not present

## 2015-04-25 DIAGNOSIS — Z5111 Encounter for antineoplastic chemotherapy: Secondary | ICD-10-CM

## 2015-04-25 DIAGNOSIS — C252 Malignant neoplasm of tail of pancreas: Secondary | ICD-10-CM

## 2015-04-25 DIAGNOSIS — C801 Malignant (primary) neoplasm, unspecified: Secondary | ICD-10-CM | POA: Diagnosis not present

## 2015-04-25 DIAGNOSIS — C259 Malignant neoplasm of pancreas, unspecified: Secondary | ICD-10-CM | POA: Diagnosis not present

## 2015-04-25 LAB — COMPREHENSIVE METABOLIC PANEL
ALBUMIN: 3.7 g/dL (ref 3.5–5.0)
ALK PHOS: 62 U/L (ref 38–126)
ALT: 24 U/L (ref 14–54)
AST: 20 U/L (ref 15–41)
Anion gap: 6 (ref 5–15)
BILIRUBIN TOTAL: 0.9 mg/dL (ref 0.3–1.2)
BUN: 25 mg/dL — AB (ref 6–20)
CALCIUM: 8.7 mg/dL — AB (ref 8.9–10.3)
CO2: 29 mmol/L (ref 22–32)
CREATININE: 0.81 mg/dL (ref 0.44–1.00)
Chloride: 105 mmol/L (ref 101–111)
GFR calc Af Amer: >60 mL/min (ref >60)
GFR calc non Af Amer: >60 mL/min (ref >60)
GLUCOSE: 106 mg/dL — AB (ref 65–99)
Potassium: 4.3 mmol/L (ref 3.5–5.1)
Sodium: 140 mmol/L (ref 135–145)
TOTAL PROTEIN: 6.1 g/dL — AB (ref 6.5–8.1)

## 2015-04-25 LAB — CBC WITH DIFFERENTIAL/PLATELET
BASOS ABS: 0 10*3/uL (ref 0.0–0.1)
BASOS PCT: 0 %
Eosinophils Absolute: 0 10*3/uL (ref 0.0–0.7)
Eosinophils Relative: 0 %
HEMATOCRIT: 32.8 % — AB (ref 36.0–46.0)
HEMOGLOBIN: 10.8 g/dL — AB (ref 12.0–15.0)
LYMPHS PCT: 24 %
Lymphs Abs: 1.3 10*3/uL (ref 0.7–4.0)
MCH: 28.3 pg (ref 26.0–34.0)
MCHC: 32.9 g/dL (ref 30.0–36.0)
MCV: 85.9 fL (ref 78.0–100.0)
Monocytes Absolute: 0.1 10*3/uL (ref 0.1–1.0)
Monocytes Relative: 2 %
NEUTROS ABS: 3.8 10*3/uL (ref 1.7–7.7)
Neutrophils Relative %: 74 %
Platelets: 110 10*3/uL — ABNORMAL LOW (ref 150–400)
RBC: 3.82 MIL/uL — ABNORMAL LOW (ref 3.87–5.11)
RDW: 18.5 % — ABNORMAL HIGH (ref 11.5–15.5)
WBC: 5.2 10*3/uL (ref 4.0–10.5)

## 2015-04-25 MED ORDER — PACLITAXEL PROTEIN-BOUND CHEMO INJECTION 100 MG
125.0000 mg/m2 | Freq: Once | INTRAVENOUS | Status: AC
  Administered 2015-04-25: 225 mg via INTRAVENOUS
  Filled 2015-04-25: qty 45

## 2015-04-25 MED ORDER — SODIUM CHLORIDE 0.9 % IV SOLN
Freq: Once | INTRAVENOUS | Status: AC
  Administered 2015-04-25: 8 mg via INTRAVENOUS
  Filled 2015-04-25: qty 4

## 2015-04-25 MED ORDER — SODIUM CHLORIDE 0.9 % IV SOLN
Freq: Once | INTRAVENOUS | Status: AC
  Administered 2015-04-25: 14:00:00 via INTRAVENOUS

## 2015-04-25 MED ORDER — SODIUM CHLORIDE 0.9 % IV SOLN: 8.0000 mg | Freq: Once | INTRAVENOUS | Status: DC

## 2015-04-25 MED ORDER — HEPARIN SOD (PORK) LOCK FLUSH 100 UNIT/ML IV SOLN
500.0000 [IU] | Freq: Once | INTRAVENOUS | Status: AC
  Administered 2015-04-25: 500 [IU] via INTRAVENOUS

## 2015-04-25 MED ORDER — SODIUM CHLORIDE 0.9 % IJ SOLN
10.0000 mL | INTRAMUSCULAR | Status: DC | PRN
  Administered 2015-04-25: 10 mL
  Filled 2015-04-25: qty 10

## 2015-04-25 MED ORDER — SODIUM CHLORIDE 0.9 % IV SOLN
1000.0000 mg/m2 | Freq: Once | INTRAVENOUS | Status: AC
  Administered 2015-04-25: 1786 mg via INTRAVENOUS
  Filled 2015-04-25: qty 47

## 2015-04-25 MED ORDER — HEPARIN SOD (PORK) LOCK FLUSH 100 UNIT/ML IV SOLN: 500.0000 [IU] | Freq: Once | INTRAVENOUS | Status: AC | PRN

## 2015-04-25 MED ORDER — DEXAMETHASONE SODIUM PHOSPHATE 10 MG/ML IJ SOLN: 10.0000 mg | Freq: Once | INTRAMUSCULAR | Status: DC

## 2015-04-25 NOTE — Patient Instructions (Signed)
[facility] Discharge Instructions for Patients Receiving Chemotherapy  Today you received the following chemotherapy agents:  Abraxane and Gemzar  If you develop nausea and vomiting, or diarrhea that is not controlled by your medication, call the clinic.  The clinic phone number is [phone]. Office hours are Monday-Friday 8:30am-5:00pm.  BELOW ARE SYMPTOMS THAT SHOULD BE REPORTED IMMEDIATELY:  *FEVER GREATER THAN 101.0 F  *CHILLS WITH OR WITHOUT FEVER  NAUSEA AND VOMITING THAT IS NOT CONTROLLED WITH YOUR NAUSEA MEDICATION  *UNUSUAL SHORTNESS OF BREATH  *UNUSUAL BRUISING OR BLEEDING  TENDERNESS IN MOUTH AND THROAT WITH OR WITHOUT PRESENCE OF ULCERS  *URINARY PROBLEMS  *BOWEL PROBLEMS  UNUSUAL RASH Items with * indicate a potential emergency and should be followed up as soon as possible. If you have an emergency after office hours please contact your primary care physician or go to the nearest emergency department.  Please call the clinic during office hours if you have any questions or concerns.   You may also contact the Patient Navigator at [phone] should you have any questions or need assistance in obtaining follow up care.

## 2015-04-25 NOTE — Progress Notes (Signed)
Tolerated tx w/o adverse reaction.  A&Ox4, in no distress.  VSS.  Discharged via wheelchair.

## 2015-04-29 NOTE — Assessment & Plan Note (Signed)
81 y/o female with history of adenomatous colon polyps, last found in 2008, who presents for possible colonoscopy. Patient diagnosed with Stage IV pancreatic cancer within the past year. She is on chemotherapy. Given her comorbidities, age, the benefits of colonoscopy do not appear to outweigh risks at this time. I will discuss further with Dr. Gala Romney, but would not advise surveillance colonoscopy at this time, patient in agreement.

## 2015-04-30 NOTE — Progress Notes (Signed)
cc'ed to pcp °

## 2015-05-01 ENCOUNTER — Ambulatory Visit: Payer: Medicare Other | Admitting: Orthopedic Surgery

## 2015-05-10 ENCOUNTER — Ambulatory Visit (INDEPENDENT_AMBULATORY_CARE_PROVIDER_SITE_OTHER): Payer: Medicare Other | Admitting: Family Medicine

## 2015-05-10 ENCOUNTER — Other Ambulatory Visit: Payer: Self-pay | Admitting: Family Medicine

## 2015-05-10 ENCOUNTER — Other Ambulatory Visit: Payer: Self-pay

## 2015-05-10 ENCOUNTER — Encounter: Payer: Self-pay | Admitting: Family Medicine

## 2015-05-10 VITALS — BP 130/70 | HR 56 | Resp 16 | Ht 63.0 in | Wt 147.0 lb

## 2015-05-10 DIAGNOSIS — R05 Cough: Secondary | ICD-10-CM

## 2015-05-10 DIAGNOSIS — M549 Dorsalgia, unspecified: Secondary | ICD-10-CM

## 2015-05-10 DIAGNOSIS — R059 Cough, unspecified: Secondary | ICD-10-CM

## 2015-05-10 DIAGNOSIS — F419 Anxiety disorder, unspecified: Secondary | ICD-10-CM

## 2015-05-10 DIAGNOSIS — C787 Secondary malignant neoplasm of liver and intrahepatic bile duct: Secondary | ICD-10-CM

## 2015-05-10 DIAGNOSIS — I10 Essential (primary) hypertension: Secondary | ICD-10-CM | POA: Diagnosis not present

## 2015-05-10 DIAGNOSIS — C259 Malignant neoplasm of pancreas, unspecified: Secondary | ICD-10-CM

## 2015-05-10 DIAGNOSIS — J3089 Other allergic rhinitis: Secondary | ICD-10-CM

## 2015-05-10 MED ORDER — HYDROCODONE-ACETAMINOPHEN 5-325 MG PO TABS: ORAL_TABLET | ORAL | Status: DC

## 2015-05-10 MED ORDER — HYDROCODONE-ACETAMINOPHEN 5-325 MG PO TABS: 1.0000 | ORAL_TABLET | Freq: Two times a day (BID) | ORAL | Status: DC

## 2015-05-10 MED ORDER — GABAPENTIN 100 MG PO CAPS: 100.0000 mg | ORAL_CAPSULE | Freq: Every day | ORAL | Status: DC

## 2015-05-10 MED ORDER — POTASSIUM CHLORIDE ER 10 MEQ PO CPCR: 10.0000 meq | ORAL_CAPSULE | Freq: Two times a day (BID) | ORAL | Status: DC

## 2015-05-10 NOTE — Patient Instructions (Addendum)
F/u with wellness, in 4 month, call iof you ned me sooner  Reduced dose of hydrocodone to twice daily, next fill on Jan 15 as you have 19 days here today  Continue gabapentin one at night, this helps with pain and sleep also.  Thanks for choosing [facility], we consider it a privelige to serve you.   All the best for 2017

## 2015-05-10 NOTE — Assessment & Plan Note (Signed)
Marked improvement in pain and function since epidural injection Hsd 19 days of hydrocodone, Using twice daily not 3 times daily, next collect date with lower dose

## 2015-05-10 NOTE — Progress Notes (Signed)
   Subjective:    Patient ID: [name], female    DOB: [birth year], 81 y.o.   MRN: [id number]  HPI   [name]     MRN: [id number]      DOB: 1928/11/05   HPI [name] is here for follow up and re-evaluation of chronic medical conditions, medication management and review of any available recent lab and radiology data.  Preventive health is updated, specifically  Cancer screening and Immunization.   Questions or concerns regarding consultations or procedures which the PT has had in the interim are  Addressed.Reports improvement in back pain following epidural using less pain medication than originally prescribed, which is good The PT denies any adverse reactions to current medications since the last visit.  There are no new concerns.  There are no specific complaints   ROS Denies recent fever or chills. Denies sinus pressure, nasal congestion, ear pain or sore throat. Denies chest congestion, productive cough or wheezing. Denies chest pains, palpitations and leg swelling Denies abdominal pain, nausea, vomiting,diarrhea or constipation.   Denies dysuria, frequency, hesitancy or incontinence. Denies uncontrolled  joint pain, swelling and limitation in mobility. Denies headaches, seizures, numbness, or tingling. Denies depression, anxiety or insomnia. Denies skin break down or rash.   PE  BP 130/70 mmHg  Pulse 56  Resp 16  Ht 5\' 3"  (1.6 m)  Wt 147 lb (66.679 kg)  BMI 26.05 kg/m2  SpO2 97%  Patient alert and oriented and in no cardiopulmonary distress.  HEENT: No facial asymmetry, EOMI,   oropharynx pink and moist.  Neck supple no JVD, no mass.  Chest: Clear to auscultation bilaterally.  CVS: S1, S2 no murmurs, no S3.Regular rate.  ABD: Soft non tender.   Ext: No edema  MS: Adequate though reduced ROM lumbar spine, adequate in  shoulders, hips and knees.  Skin: Intact, no ulcerations or rash noted.  Psych: Good eye contact, normal affect. Memory intact  not anxious or depressed appearing.  CNS: CN 2-12 intact, power,  normal throughout.no focal deficits noted.   Assessment & Plan  Back pain with radiation Marked improvement in pain and function since epidural injection Hsd 19 days of hydrocodone, Using twice daily not 3 times daily, next collect date with lower dose  Anxiety Controlled, no change in medication   Essential hypertension Controlled, no change in medication   Allergic rhinitis Controlled, no change in medication   Cough Improved and currently asymptomatic  Pancreatic cancer metastasized to liver Excellent response to treatment , and ongoing care by oncology       Review of Systems     Objective:   Physical Exam        Assessment & Plan:

## 2015-05-14 NOTE — Assessment & Plan Note (Signed)
Improved and currently asymptomatic

## 2015-05-14 NOTE — Progress Notes (Signed)
[name], MD [address], [address] [place] [address]  Pancreatic cancer metastasized to liver [facility])  CURRENT THERAPY: Palliative Gemcitabine/Abraxane  INTERVAL HISTORY: [name] 82 y.o. female returns for followup of Stage IV pancreatic cancer.    Pancreatic cancer metastasized to liver ([place])   03/10/2014 Initial Diagnosis Pancreatic cancer metastasized to liver   03/22/2014 -  Chemotherapy Abraxane/Gemzar days 1, 8, every 28 days.  Day 15 was cancelled due to leukopenia and thrombocytopenia on day 15 cycle 1.   05/24/2014 Treatment Plan Change Day 8 of cycle 3 is held with ANC of 1.1   06/05/2014 Imaging CT C/A/P . Interval decrease in size of the pancreatic tail mass.Improved hepatic metastatic disease. No new lesions. No CT findings for metastatic disease involving the chest.   08/09/2014 Tumor Marker CA 19-9= 33 (WNL)   10/26/2014 Imaging MRI- Continued interval decrease in size of the hepatic metastatic lesions and no new lesions are identified. Continued decrease in size of the pancreatic tail lesion.   01/03/2015 Tumor Marker Results for CADENCE, LAMAY (MRN [id number]) as of 01/18/2015 12:52  01/03/2015 10:00 CA 19-9: 14    01/17/2015 Imaging MRI- Response to therapy of hepatic metastasis.  Similar size of a pancreatic tail lesion.   03/20/2015 Imaging MRI-L spine- Severe disc space narrowing at L2-L3, with endplate reactive changes. Large disc extrusion into the ventral epidural space,central to the RIGHT with a cephalad migrated free fragment. Additional Large disc extrusion into the retroperitone...   03/22/2015 Imaging CT pelvis- No evidence for metastatic disease within the pelvis.    I personally reviewed and went over laboratory results with the patient.  The results are noted within this dictation.  She meets treatment parameters today.  She is S/P epidural injection according to Dr. Griffin Dakin last dictation.  She noted an improvement in pain  symptoms.  Today, she confirms a good response to this intervention.  Florabel is tolerating treatment well without any other complaints.  She denies any nausea and vomiting.  Her appetite is strong.  Her weight is stable.  She denies any abdominal pain.  She denies any jaundice.   I discussed the role of future imaging.  Her Ca19-9 was elevated at time of dx and has come down nicely with treatment.  It is WNL most recently.  We will update this today.  She is 3 months out from her last targeted imaging for response to therapy.  We discussed timing of future imaging.  As long as she is asymptomatic and her CA19-9 is WNL and stable, I would be inclined to hold off on imaging.  We could do it at any time, but I would rather wait until Feb/March 2017.  She is agreeable to this plan of action for the time being.  She denies any oncology complaints.  Past Medical History  Diagnosis Date  . Hypertension 20 years   . Chronic diarrhea   . Anxiety   . Depression   . Kidney stone     hx/ crushed   . Hx of adenomatous colonic polyps     tubular adenomas, last found in 2008  . GERD (gastroesophageal reflux disease)   . Erosive esophagitis   . Schatzki's ring 08/26/10    Last dilated on EGD by Dr. Trevor Iha [facility], linear gastric erosions, BI hemigastrectomy  . Hyperplastic colon polyp 03/19/10    tcs by Dr. Gala Romney  . Pancreatic cancer ([place]) 02/2014  . Pancreatic cancer metastasized  to liver ([place]) 03/10/2014  . Opioid contract exists 04/18/2015    With Dr. Moshe Cipro    has Essential hypertension; GERD (gastroesophageal reflux disease); Anxiety; Primary generalized (osteo)arthritis; Back pain with radiation; IGT (impaired glucose tolerance); Anemia, iron deficiency; Elevated LFTs; Abnormal liver ultrasound; Pancreatic cancer metastasized to liver [facility]); Allergic cough; Allergic rhinitis; Cough; Heme positive stool; Knee pain, right; Opioid contract exists; and Hx of adenomatous colonic polyps on her  problem list.     is allergic to iohexol; aciphex; amlodipine besylate-valsartan; esomeprazole magnesium; omeprazole; penicillins; and ciprofloxacin.  Current Outpatient Prescriptions on File Prior to Visit  Medication Sig Dispense Refill  . aspirin 81 MG EC tablet Take 81 mg by mouth every morning.     . AZOR 10-40 MG tablet TAKE 1 TABLET DAILY 90 tablet 0  . benzonatate (TESSALON) 100 MG capsule Take 1 capsule (100 mg total) by mouth 2 (two) times daily as needed for cough. 20 capsule 0  . cetirizine (ZYRTEC) 10 MG tablet Take 1 tablet (10 mg total) by mouth every morning. 30 tablet 3  . cholestyramine light (PREVALITE) 4 GM/DOSE powder TAKE 1 SCOOP BY MOUTH DAILY 239.4 g 3  . clonazePAM (KLONOPIN) 0.5 MG tablet Take 1 tablet (0.5 mg total) by mouth 2 (two) times daily as needed for anxiety. 60 tablet 3  . cyclobenzaprine (FLEXERIL) 10 MG tablet Take 1 tablet (10 mg total) by mouth 2 (two) times daily as needed for muscle spasms. 30 tablet 1  . cycloSPORINE (RESTASIS) 0.05 % ophthalmic emulsion Place 1 drop into both eyes 2 (two) times daily.    . diphenhydrAMINE (BENADRYL) 50 MG tablet 50 mg PO 1 hour prior to CT scan 1 tablet 0  . ferrous sulfate 325 (65 FE) MG tablet Take 325 mg by mouth daily with breakfast.    . fluticasone (FLONASE) 50 MCG/ACT nasal spray Place 2 sprays into both nostrils daily.    Marland Kitchen gabapentin (NEURONTIN) 100 MG capsule Take 1 capsule (100 mg total) by mouth at bedtime. 30 capsule 3  . HYDROcodone-acetaminophen (NORCO/VICODIN) 5-325 MG tablet One tablet twice daily 60 tablet 0  . lidocaine-prilocaine (EMLA) cream Apply a quarter size amount to port site 1 hour prior to chemo. Do not rub in. Cover with plastic wrap. 30 g 3  . naproxen (NAPROSYN) 500 MG tablet Take 500 mg by mouth daily.    . nebivolol (BYSTOLIC) 10 MG tablet Take 1 tablet (10 mg total) by mouth daily. 90 tablet 1  . Omega-3 Fatty Acids (OMEGA 3 PO) Take by mouth.    . pantoprazole (PROTONIX) 20 MG  tablet Take 1 tablet (20 mg total) by mouth daily. 90 tablet 0  . potassium chloride (MICRO-K) 10 MEQ CR capsule Take 1 capsule (10 mEq total) by mouth 2 (two) times daily. 60 capsule 1  . predniSONE (DELTASONE) 5 MG tablet Take 1 tablet (5 mg total) by mouth daily with breakfast. 30 tablet 1  . Probiotic Product (HEALTHY COLON PO) Take 1 tablet by mouth every morning.     . senna (SENOKOT) 8.6 MG tablet Take 1 tablet by mouth daily.    Marland Kitchen azelastine (ASTELIN) 0.1 % nasal spray Place 2 sprays into both nostrils 2 (two) times daily. Use in each nostril as directed (Patient not taking: Reported on 05/16/2015) 30 mL 12  . montelukast (SINGULAIR) 10 MG tablet Take 1 tablet (10 mg total) by mouth at bedtime. (Patient not taking: Reported on 05/16/2015) 30 tablet 3  . predniSONE (DELTASONE) 50  MG tablet Take 50 mg 13 hours before, 7 hours before, and 1 hour before CT scan (Patient not taking: Reported on 05/16/2015) 3 tablet 0  . predniSONE (STERAPRED UNI-PAK 21 TAB) 5 MG (21) TBPK tablet Use as directed (Patient not taking: Reported on 05/16/2015) 21 tablet 0  . prochlorperazine (COMPAZINE) 10 MG tablet Take 1 tablet (10 mg total) by mouth every 6 (six) hours as needed for nausea or vomiting. (Patient not taking: Reported on 05/16/2015) 60 tablet 2   Current Facility-Administered Medications on File Prior to Visit  Medication Dose Route Frequency Provider Last Rate Last Dose  . dexamethasone (DECADRON) 10 mg in sodium chloride 0.9 % 50 mL IVPB   Intravenous Once Patrici Ranks, MD      . sodium chloride 0.9 % injection 10 mL  10 mL Intracatheter PRN [facility], PA-C        Past Surgical History  Procedure Laterality Date  . Stomach ulcer  50 years ago     had some of her stomach removed   . Bunion removal      from both feet   . Left shoulder surgery  2009    DR HARRISON  . Colonoscopy  03/19/2010    DR Gala Romney,, normal TI, pancolonic diverticula, random colon bx neg., hyperplastic polyps removed  .  Esophagogastroduodenoscopy  11/22/2003    DR Gala Romney, erosive RE, Billroth I  . Esophagogastroduodenoscopy  08/26/10    Dr. Gala Romney- moderate severe ERE, Scahtzki ring s/p dilation, Billroth I, linear gastric erosions, bx-gastric xanthelasma  . Breast lumpectomy Left   . Orif ankle fracture Right 05/22/2013    Procedure: OPEN REDUCTION INTERNAL FIXATION (ORIF) RIGHT ANKLE FRACTURE;  Surgeon: Sanjuana Kava, MD;  Location: [facility];  Service: Orthopedics;  Laterality: Right;  . Cholecystectomy  1965     Denies any headaches, dizziness, double vision, fevers, chills, night sweats, nausea, vomiting, diarrhea, constipation, chest pain, heart palpitations, shortness of breath, blood in stool, black tarry stool, urinary pain, urinary burning, urinary frequency, hematuria.   PHYSICAL EXAMINATION  ECOG PERFORMANCE STATUS: 2 - Symptomatic, <50% confined to bed  There were no vitals filed for this visit.  GENERAL:alert, well nourished, well developed, comfortable, cooperative, smiling and accompanied by her sister today in chemo-recliner.  SKIN: skin color, texture, turgor are normal, no rashes or significant lesions HEAD: Normocephalic, No masses, lesions, tenderness or abnormalities EYES: normal, PERRLA, EOMI, Conjunctiva are pink and non-injected EARS: External ears normal OROPHARYNX:lips, buccal mucosa, and tongue normal and mucous membranes are moist  NECK: supple, trachea midline LYMPH:  no palpable lymphadenopathy BREAST:not examined LUNGS: clear to auscultation  HEART: regular rate & rhythm ABDOMEN:abdomen soft and normal bowel sounds, nontender BACK: Back symmetric, no curvature. EXTREMITIES:less then 2 second capillary refill, no joint deformities, effusion, or inflammation, no skin discoloration, no cyanosis  NEURO: alert & oriented x 3 with fluent speech, no focal motor/sensory deficits.    LABORATORY DATA: CBC    Component Value Date/Time   WBC 5.9 05/16/2015 1000   RBC 3.64*  05/16/2015 1000   HGB 10.4* 05/16/2015 1000   HCT 32.2* 05/16/2015 1000   PLT 240 05/16/2015 1000   MCV 88.5 05/16/2015 1000   MCH 28.6 05/16/2015 1000   MCHC 32.3 05/16/2015 1000   RDW 18.8* 05/16/2015 1000   LYMPHSABS 1.6 05/16/2015 1000   MONOABS 0.6 05/16/2015 1000   EOSABS 0.1 05/16/2015 1000   BASOSABS 0.0 05/16/2015 1000      Chemistry  Component Value Date/Time   NA 141 05/16/2015 1000   K 4.4 05/16/2015 1000   CL 106 05/16/2015 1000   CO2 28 05/16/2015 1000   BUN 19 05/16/2015 1000   CREATININE 0.97 05/16/2015 1000   CREATININE 0.97 02/14/2014 0758      Component Value Date/Time   CALCIUM 8.9 05/16/2015 1000   ALKPHOS 72 05/16/2015 1000   AST 17 05/16/2015 1000   ALT 13* 05/16/2015 1000   BILITOT 0.7 05/16/2015 1000      PENDING LABS:   RADIOGRAPHIC STUDIES:  Dg Epidurography  04/19/2015  CLINICAL DATA:  Low back pain with radiation into the right lower extremity. Right lower extremity radiculitis. Displacement of the lumbar disc at multiple levels. FLUOROSCOPY TIME:  0.184 Gy*cm2 PROCEDURE: The procedure, risks, benefits, and alternatives were explained to the patient. Questions regarding the procedure were encouraged and answered. The patient understands and consents to the procedure. LUMBAR EPIDURAL INJECTION: An interlaminar approach was performed on right at L3-4. The overlying skin was cleansed and anesthetized. A 20 gauge Crawford epidural needle was advanced using loss-of-resistance technique. DIAGNOSTIC EPIDURAL INJECTION: Injection of Isovue-M 200 shows a good epidural pattern with spread above and below the level of needle placement, primarily on the right no vascular opacification is seen. THERAPEUTIC EPIDURAL INJECTION: 120 Mg of Depo-Medrol mixed with 3 mL 1% lidocaine were instilled. The procedure was well-tolerated, and the patient was discharged thirty minutes following the injection in good condition. COMPLICATIONS: None IMPRESSION: Technically  successful epidural injection on the right L3-4 # 1 Electronically Signed   By: San Morelle M.D.   On: 04/19/2015 12:13     PATHOLOGY:    ASSESSMENT AND PLAN:  Pancreatic cancer metastasized to liver Stage IV Pancreatic Cancer, on systemic therapy with Abraxane and Gemzar in a day 1, 8 fashion every 28 days.  Continued response to therapy.  Oncology history is up to date.   Patient has entered into a pain contract with her primary care provider for non-oncologic pain.   Pre-chemo labs as ordered.  Labs meet treatment parameters today.  Her cancer marker is WNL and is stable.  It was drawn today and is currently in process.  Her last imaging was in September 2016 with continued improvement in disease.  For now, I will hold off on re-imaging her and repeat an MRI in Feb/March 2017 with continued monitoring of her CA19-9.  If there is any increase in her cancer marker, or she develops symptoms, we will pursue an MRI scan sooner.    Return in 28 days for follow-up.    THERAPY PLAN:  Continue treatment as planned, unless imaging is concerning for progression of disease.  All questions were answered. The patient knows to call the clinic with any problems, questions or concerns. We can certainly see the patient much sooner if necessary.  Patient and plan discussed with Dr. Ancil Linsey and she is in agreement with the aforementioned.   This note is electronically signed by: Doy Mince 05/16/2015 4:33 PM

## 2015-05-14 NOTE — Assessment & Plan Note (Signed)
Controlled, no change in medication  

## 2015-05-14 NOTE — Assessment & Plan Note (Addendum)
Stage IV Pancreatic Cancer, on systemic therapy with Abraxane and Gemzar in a day 1, 8 fashion every 28 days.  Continued response to therapy.  Oncology history is up to date.   Patient has entered into a pain contract with her primary care provider for non-oncologic pain.   Pre-chemo labs as ordered.  Labs meet treatment parameters today.  Her cancer marker is WNL and is stable.  It was drawn today and is currently in process.  Her last imaging was in September 2016 with continued improvement in disease.  For now, I will hold off on re-imaging her and repeat an MRI in Feb/March 2017 with continued monitoring of her CA19-9.  If there is any increase in her cancer marker, or she develops symptoms, we will pursue an MRI scan sooner.    Return in 28 days for follow-up.

## 2015-05-14 NOTE — Assessment & Plan Note (Signed)
Excellent response to treatment , and ongoing care by oncology

## 2015-05-16 ENCOUNTER — Encounter (HOSPITAL_BASED_OUTPATIENT_CLINIC_OR_DEPARTMENT_OTHER): Payer: Medicare Other | Admitting: Oncology

## 2015-05-16 ENCOUNTER — Encounter (HOSPITAL_COMMUNITY): Payer: Self-pay | Admitting: Oncology

## 2015-05-16 ENCOUNTER — Encounter (HOSPITAL_COMMUNITY): Payer: Medicare Other | Attending: Hematology and Oncology

## 2015-05-16 VITALS — BP 171/79 | HR 60 | Temp 97.9°F | Resp 16 | Wt 149.0 lb

## 2015-05-16 DIAGNOSIS — C259 Malignant neoplasm of pancreas, unspecified: Secondary | ICD-10-CM | POA: Diagnosis not present

## 2015-05-16 DIAGNOSIS — Z5111 Encounter for antineoplastic chemotherapy: Secondary | ICD-10-CM

## 2015-05-16 DIAGNOSIS — C252 Malignant neoplasm of tail of pancreas: Secondary | ICD-10-CM | POA: Insufficient documentation

## 2015-05-16 DIAGNOSIS — C801 Malignant (primary) neoplasm, unspecified: Secondary | ICD-10-CM | POA: Insufficient documentation

## 2015-05-16 DIAGNOSIS — C787 Secondary malignant neoplasm of liver and intrahepatic bile duct: Secondary | ICD-10-CM

## 2015-05-16 LAB — COMPREHENSIVE METABOLIC PANEL
ALT: 13 U/L — ABNORMAL LOW (ref 14–54)
ANION GAP: 7 (ref 5–15)
AST: 17 U/L (ref 15–41)
Albumin: 3.7 g/dL (ref 3.5–5.0)
Alkaline Phosphatase: 72 U/L (ref 38–126)
BILIRUBIN TOTAL: 0.7 mg/dL (ref 0.3–1.2)
BUN: 19 mg/dL (ref 6–20)
CHLORIDE: 106 mmol/L (ref 101–111)
CO2: 28 mmol/L (ref 22–32)
CREATININE: 0.97 mg/dL (ref 0.44–1.00)
Calcium: 8.9 mg/dL (ref 8.9–10.3)
GFR, EST NON AFRICAN AMERICAN: 52 mL/min — AB (ref >60)
Glucose, Bld: 106 mg/dL — ABNORMAL HIGH (ref 65–99)
POTASSIUM: 4.4 mmol/L (ref 3.5–5.1)
Sodium: 141 mmol/L (ref 135–145)
Total Protein: 6.4 g/dL — ABNORMAL LOW (ref 6.5–8.1)

## 2015-05-16 LAB — CBC WITH DIFFERENTIAL/PLATELET
Basophils Absolute: 0 10*3/uL (ref 0.0–0.1)
Basophils Relative: 1 %
EOS PCT: 1 %
Eosinophils Absolute: 0.1 10*3/uL (ref 0.0–0.7)
HEMATOCRIT: 32.2 % — AB (ref 36.0–46.0)
Hemoglobin: 10.4 g/dL — ABNORMAL LOW (ref 12.0–15.0)
LYMPHS ABS: 1.6 10*3/uL (ref 0.7–4.0)
LYMPHS PCT: 27 %
MCH: 28.6 pg (ref 26.0–34.0)
MCHC: 32.3 g/dL (ref 30.0–36.0)
MCV: 88.5 fL (ref 78.0–100.0)
MONO ABS: 0.6 10*3/uL (ref 0.1–1.0)
MONOS PCT: 11 %
NEUTROS ABS: 3.6 10*3/uL (ref 1.7–7.7)
Neutrophils Relative %: 60 %
PLATELETS: 240 10*3/uL (ref 150–400)
RBC: 3.64 MIL/uL — ABNORMAL LOW (ref 3.87–5.11)
RDW: 18.8 % — AB (ref 11.5–15.5)
WBC: 5.9 10*3/uL (ref 4.0–10.5)

## 2015-05-16 MED ORDER — SODIUM CHLORIDE 0.9 % IV SOLN
Freq: Once | INTRAVENOUS | Status: AC
  Administered 2015-05-16: 8 mg via INTRAVENOUS
  Filled 2015-05-16: qty 4

## 2015-05-16 MED ORDER — SODIUM CHLORIDE 0.9 % IV SOLN
1000.0000 mg/m2 | Freq: Once | INTRAVENOUS | Status: AC
  Administered 2015-05-16: 1786 mg via INTRAVENOUS
  Filled 2015-05-16: qty 46.97

## 2015-05-16 MED ORDER — HEPARIN SOD (PORK) LOCK FLUSH 100 UNIT/ML IV SOLN
INTRAVENOUS | Status: AC
  Filled 2015-05-16: qty 5

## 2015-05-16 MED ORDER — PACLITAXEL PROTEIN-BOUND CHEMO INJECTION 100 MG
125.0000 mg/m2 | Freq: Once | INTRAVENOUS | Status: AC
  Administered 2015-05-16: 225 mg via INTRAVENOUS
  Filled 2015-05-16: qty 45

## 2015-05-16 MED ORDER — HEPARIN SOD (PORK) LOCK FLUSH 100 UNIT/ML IV SOLN: 500.0000 [IU] | Freq: Once | INTRAVENOUS | Status: AC | PRN

## 2015-05-16 MED ORDER — SODIUM CHLORIDE 0.9 % IJ SOLN: 10.0000 mL | INTRAMUSCULAR | Status: DC | PRN

## 2015-05-16 MED ORDER — SODIUM CHLORIDE 0.9 % IV SOLN
8.0000 mg | Freq: Once | INTRAVENOUS | Status: DC
  Filled 2015-05-16: qty 4

## 2015-05-16 MED ORDER — SODIUM CHLORIDE 0.9 % IV SOLN
Freq: Once | INTRAVENOUS | Status: AC
  Administered 2015-05-16: 11:00:00 via INTRAVENOUS

## 2015-05-16 MED ORDER — HEPARIN SOD (PORK) LOCK FLUSH 100 UNIT/ML IV SOLN
500.0000 [IU] | Freq: Once | INTRAVENOUS | Status: AC
  Administered 2015-05-16: 500 [IU] via INTRAVENOUS

## 2015-05-16 MED ORDER — DEXAMETHASONE SODIUM PHOSPHATE 10 MG/ML IJ SOLN: 10.0000 mg | Freq: Once | INTRAMUSCULAR | Status: DC

## 2015-05-16 MED ORDER — SODIUM CHLORIDE 0.9 % IV SOLN: 8.0000 mg | Freq: Once | INTRAVENOUS | Status: DC

## 2015-05-16 NOTE — Patient Instructions (Signed)
[place] at [facility] Discharge Instructions  RECOMMENDATIONS MADE BY THE CONSULTANT AND ANY TEST RESULTS WILL BE SENT TO YOUR REFERRING PHYSICIAN.  Exam and discussion by Robynn Pane, PA-C If your tumor marker is elevated we will do a MRI if not elevated we will wait until later in the spring Report fevers, uncontrolled nausea, vomiting or other concerns.  Chemo as scheduled and to see Dr. Whitney Muse in 4 weeks.  Thank you for choosing [place] at [facility] to provide your oncology and hematology care.  To afford each patient quality time with our provider, please arrive at least 15 minutes before your scheduled appointment time.    You need to re-schedule your appointment should you arrive 10 or more minutes late.  We strive to give you quality time with our providers, and arriving late affects you and other patients whose appointments are after yours.  Also, if you no show three or more times for appointments you may be dismissed from the clinic at the providers discretion.     Again, thank you for choosing [facility].  Our hope is that these requests will decrease the amount of time that you wait before being seen by our physicians.       _____________________________________________________________  Should you have questions after your visit to [facility], please contact our office at [phone] between the hours of 8:30 a.m. and 4:30 p.m.  Voicemails left after 4:30 p.m. will not be returned until the following business day.  For prescription refill requests, have your pharmacy contact our office.

## 2015-05-16 NOTE — Progress Notes (Signed)
Tolerated well

## 2015-05-16 NOTE — Patient Instructions (Signed)
..  [facility] Discharge Instructions for Patients Receiving Chemotherapy  Today you received the following chemotherapy agents gemzar and abraxane If you develop nausea and vomiting, or diarrhea that is not controlled by your medication, call the clinic.  The clinic phone number is [phone]. Office hours are Monday-Friday 8:30am-5:00pm.  BELOW ARE SYMPTOMS THAT SHOULD BE REPORTED IMMEDIATELY:  *FEVER GREATER THAN 101.0 F  *CHILLS WITH OR WITHOUT FEVER  NAUSEA AND VOMITING THAT IS NOT CONTROLLED WITH YOUR NAUSEA MEDICATION  *UNUSUAL SHORTNESS OF BREATH  *UNUSUAL BRUISING OR BLEEDING  TENDERNESS IN MOUTH AND THROAT WITH OR WITHOUT PRESENCE OF ULCERS  *URINARY PROBLEMS  *BOWEL PROBLEMS  UNUSUAL RASH Items with * indicate a potential emergency and should be followed up as soon as possible. If you have an emergency after office hours please contact your primary care physician or go to the nearest emergency department.  Please call the clinic during office hours if you have any questions or concerns.   You may also contact the Patient Navigator at [phone] should you have any questions or need assistance in obtaining follow up care.

## 2015-05-17 LAB — CANCER ANTIGEN 19-9: CA 19 9: 14 U/mL (ref 0–35)

## 2015-05-20 NOTE — Progress Notes (Signed)
Please let patient know that RMR agrees with pursuing NO further colonoscopies for surveillance purposes.  OV prn.

## 2015-05-21 ENCOUNTER — Other Ambulatory Visit: Payer: Self-pay | Admitting: Family Medicine

## 2015-05-23 ENCOUNTER — Encounter (HOSPITAL_BASED_OUTPATIENT_CLINIC_OR_DEPARTMENT_OTHER): Payer: Medicare Other

## 2015-05-23 VITALS — BP 167/86 | HR 63 | Temp 97.7°F | Resp 18 | Wt 151.0 lb

## 2015-05-23 DIAGNOSIS — C787 Secondary malignant neoplasm of liver and intrahepatic bile duct: Secondary | ICD-10-CM

## 2015-05-23 DIAGNOSIS — C801 Malignant (primary) neoplasm, unspecified: Secondary | ICD-10-CM | POA: Diagnosis not present

## 2015-05-23 DIAGNOSIS — Z95828 Presence of other vascular implants and grafts: Secondary | ICD-10-CM

## 2015-05-23 DIAGNOSIS — C259 Malignant neoplasm of pancreas, unspecified: Secondary | ICD-10-CM

## 2015-05-23 DIAGNOSIS — Z5111 Encounter for antineoplastic chemotherapy: Secondary | ICD-10-CM | POA: Diagnosis not present

## 2015-05-23 DIAGNOSIS — C252 Malignant neoplasm of tail of pancreas: Secondary | ICD-10-CM | POA: Diagnosis not present

## 2015-05-23 LAB — CBC WITH DIFFERENTIAL/PLATELET
BASOS PCT: 0 %
Basophils Absolute: 0 10*3/uL (ref 0.0–0.1)
EOS ABS: 0 10*3/uL (ref 0.0–0.7)
Eosinophils Relative: 1 %
HEMATOCRIT: 28.9 % — AB (ref 36.0–46.0)
HEMOGLOBIN: 9.6 g/dL — AB (ref 12.0–15.0)
LYMPHS ABS: 1.1 10*3/uL (ref 0.7–4.0)
Lymphocytes Relative: 31 %
MCH: 29.1 pg (ref 26.0–34.0)
MCHC: 33.2 g/dL (ref 30.0–36.0)
MCV: 87.6 fL (ref 78.0–100.0)
MONO ABS: 0.2 10*3/uL (ref 0.1–1.0)
MONOS PCT: 5 %
NEUTROS PCT: 64 %
Neutro Abs: 2.3 10*3/uL (ref 1.7–7.7)
Platelets: 97 10*3/uL — ABNORMAL LOW (ref 150–400)
RBC: 3.3 MIL/uL — ABNORMAL LOW (ref 3.87–5.11)
RDW: 17.3 % — AB (ref 11.5–15.5)
WBC: 3.6 10*3/uL — ABNORMAL LOW (ref 4.0–10.5)

## 2015-05-23 LAB — COMPREHENSIVE METABOLIC PANEL
ALBUMIN: 3.5 g/dL (ref 3.5–5.0)
ALK PHOS: 72 U/L (ref 38–126)
ALT: 12 U/L — ABNORMAL LOW (ref 14–54)
ANION GAP: 7 (ref 5–15)
AST: 15 U/L (ref 15–41)
BILIRUBIN TOTAL: 0.7 mg/dL (ref 0.3–1.2)
BUN: 17 mg/dL (ref 6–20)
CALCIUM: 8.6 mg/dL — AB (ref 8.9–10.3)
CO2: 26 mmol/L (ref 22–32)
Chloride: 110 mmol/L (ref 101–111)
Creatinine, Ser: 0.97 mg/dL (ref 0.44–1.00)
GFR calc non Af Amer: 52 mL/min — ABNORMAL LOW (ref >60)
Glucose, Bld: 115 mg/dL — ABNORMAL HIGH (ref 65–99)
POTASSIUM: 3.8 mmol/L (ref 3.5–5.1)
Sodium: 143 mmol/L (ref 135–145)
TOTAL PROTEIN: 6.3 g/dL — AB (ref 6.5–8.1)

## 2015-05-23 MED ORDER — HEPARIN SOD (PORK) LOCK FLUSH 100 UNIT/ML IV SOLN
INTRAVENOUS | Status: AC
  Filled 2015-05-23: qty 5

## 2015-05-23 MED ORDER — DEXAMETHASONE SODIUM PHOSPHATE 10 MG/ML IJ SOLN: 10.0000 mg | Freq: Once | INTRAMUSCULAR | Status: DC

## 2015-05-23 MED ORDER — SODIUM CHLORIDE 0.9 % IV SOLN
Freq: Once | INTRAVENOUS | Status: AC
  Administered 2015-05-23: 8 mg via INTRAVENOUS
  Filled 2015-05-23: qty 4

## 2015-05-23 MED ORDER — PACLITAXEL PROTEIN-BOUND CHEMO INJECTION 100 MG
125.0000 mg/m2 | Freq: Once | INTRAVENOUS | Status: AC
  Administered 2015-05-23: 225 mg via INTRAVENOUS
  Filled 2015-05-23: qty 45

## 2015-05-23 MED ORDER — SODIUM CHLORIDE 0.9 % IV SOLN: 8.0000 mg | Freq: Once | INTRAVENOUS | Status: DC

## 2015-05-23 MED ORDER — SODIUM CHLORIDE 0.9 % IV SOLN
Freq: Once | INTRAVENOUS | Status: AC
  Administered 2015-05-23: 13:00:00 via INTRAVENOUS

## 2015-05-23 MED ORDER — SODIUM CHLORIDE 0.9 % IV SOLN
1000.0000 mg/m2 | Freq: Once | INTRAVENOUS | Status: AC
  Administered 2015-05-23: 1786 mg via INTRAVENOUS
  Filled 2015-05-23: qty 46.97

## 2015-05-23 MED ORDER — SODIUM CHLORIDE 0.9 % IJ SOLN
10.0000 mL | INTRAMUSCULAR | Status: DC | PRN
  Administered 2015-05-23: 10 mL
  Filled 2015-05-23: qty 10

## 2015-05-23 MED ORDER — HEPARIN SOD (PORK) LOCK FLUSH 100 UNIT/ML IV SOLN
500.0000 [IU] | Freq: Once | INTRAVENOUS | Status: AC
  Administered 2015-05-23: 500 [IU] via INTRAVENOUS

## 2015-05-23 MED ORDER — HEPARIN SOD (PORK) LOCK FLUSH 100 UNIT/ML IV SOLN: 500.0000 [IU] | Freq: Once | INTRAVENOUS | Status: AC | PRN

## 2015-05-23 MED ORDER — SODIUM CHLORIDE 0.9 % IV SOLN
8.0000 mg | Freq: Once | INTRAVENOUS | Status: AC
  Filled 2015-05-23: qty 4

## 2015-05-23 NOTE — Patient Instructions (Signed)
[facility] Discharge Instructions for Patients Receiving Chemotherapy  Today you received the following chemotherapy agents Abraxane and Gemzar.  To help prevent nausea and vomiting after your treatment, we encourage you to take your nausea medication as instructed.   If you develop nausea and vomiting that is not controlled by your nausea medication, call the clinic. If it is after clinic hours your family physician or the after hours number for the clinic or go to the Emergency Department.  BELOW ARE SYMPTOMS THAT SHOULD BE REPORTED IMMEDIATELY:  *FEVER GREATER THAN 101.0 F  *CHILLS WITH OR WITHOUT FEVER  NAUSEA AND VOMITING THAT IS NOT CONTROLLED WITH YOUR NAUSEA MEDICATION  *UNUSUAL SHORTNESS OF BREATH  *UNUSUAL BRUISING OR BLEEDING  TENDERNESS IN MOUTH AND THROAT WITH OR WITHOUT PRESENCE OF ULCERS  *URINARY PROBLEMS  *BOWEL PROBLEMS  UNUSUAL RASH Items with * indicate a potential emergency and should be followed up as soon as possible.  Return as scheduled.  I have been informed and understand all the instructions given to me. I know to contact the clinic, my physician, or go to the Emergency Department if any problems should occur. I do not have any questions at this time, but understand that I may call the clinic during office hours or the Patient Navigator at [phone] should I have any questions or need assistance in obtaining follow up care.    __________________________________________  _____________  __________ Signature of Patient or Authorized Representative            Date                   Time    __________________________________________ Nurse's Signature  

## 2015-05-23 NOTE — Progress Notes (Signed)
Tolerated chemo well. Stable on discharge home with sister.

## 2015-05-24 ENCOUNTER — Telehealth: Payer: Self-pay | Admitting: Family Medicine

## 2015-05-24 NOTE — Telephone Encounter (Signed)
Pt will come collect tomorrow

## 2015-05-24 NOTE — Telephone Encounter (Signed)
Patient is asking for a refill on her HYDROcodone-acetaminophen (NORCO/VICODIN) 5-325 MG tablet she states that she has to take 3 sometimes a day, please advise?

## 2015-05-25 ENCOUNTER — Other Ambulatory Visit: Payer: Self-pay | Admitting: Family Medicine

## 2015-05-28 NOTE — Progress Notes (Signed)
Tried to call pt- number was busy  

## 2015-05-29 ENCOUNTER — Telehealth (HOSPITAL_COMMUNITY): Payer: Self-pay | Admitting: *Deleted

## 2015-05-29 NOTE — Telephone Encounter (Signed)
Patient called letting me know that she was having horrible pain in her knees and ankles rating it a 8 out of 10. She wanted to come in to see Tom. Gershon Mussel stated that there was nothing he could do because she was on a pain contract with Dr. Moshe Cipro. This was relayed back to the patient in which she was fine with. I asked her to please call and get an appt with Dr. Moshe Cipro. She said ok and thank you.

## 2015-05-30 ENCOUNTER — Ambulatory Visit (INDEPENDENT_AMBULATORY_CARE_PROVIDER_SITE_OTHER): Payer: Medicare Other | Admitting: Family Medicine

## 2015-05-30 ENCOUNTER — Encounter: Payer: Self-pay | Admitting: Family Medicine

## 2015-05-30 VITALS — BP 130/67 | HR 66 | Resp 18 | Ht 63.0 in | Wt 150.0 lb

## 2015-05-30 DIAGNOSIS — G8929 Other chronic pain: Secondary | ICD-10-CM

## 2015-05-30 DIAGNOSIS — F419 Anxiety disorder, unspecified: Secondary | ICD-10-CM | POA: Diagnosis not present

## 2015-05-30 DIAGNOSIS — C259 Malignant neoplasm of pancreas, unspecified: Secondary | ICD-10-CM

## 2015-05-30 DIAGNOSIS — M5440 Lumbago with sciatica, unspecified side: Secondary | ICD-10-CM

## 2015-05-30 DIAGNOSIS — I10 Essential (primary) hypertension: Secondary | ICD-10-CM

## 2015-05-30 DIAGNOSIS — M25561 Pain in right knee: Secondary | ICD-10-CM

## 2015-05-30 DIAGNOSIS — C787 Secondary malignant neoplasm of liver and intrahepatic bile duct: Secondary | ICD-10-CM

## 2015-05-30 MED ORDER — AMLODIPINE-OLMESARTAN 10-40 MG PO TABS: 1.0000 | ORAL_TABLET | Freq: Every day | ORAL | Status: DC

## 2015-05-30 NOTE — Patient Instructions (Addendum)
F/u  As before, call if you need me sooner  You are referred to Dr Aline Brochure re right knee pain due to arthritis   Use rub on knee 3 times daily  Thanks for choosing [facility], we consider it a privelige to serve you.

## 2015-05-30 NOTE — Progress Notes (Signed)
   Subjective:    Patient ID: [name], female    DOB: Apr 24, 1929, 82 y.o.   MRN: [id number]  HPI Pt in with her sister with c/o increased pain and swelling in the right knee causing debility and poor level of function and quality of life, wants help Her pain meds are currently taking care of her chronic back pain Sister notes increased anxiety at time with a "mean spirit" in recent  Times, also states she always states that  She is experiencing symptoms of recurrent cancer when all tests show the opposite  Review of Systems See HPI Denies recent fever or chills. Denies sinus pressure, nasal congestion, ear pain or sore throat. Denies chest congestion, productive cough or wheezing. Denies chest pains, palpitations and leg swelling Denies abdominal pain, nausea, vomiting,diarrhea or constipation.   Denies dysuria, frequency, hesitancy or incontinence.  Denies headaches, seizures,  Denies skin break down or rash.        Objective:   Physical Exam  BP 130/67 mmHg  Pulse 66  Resp 18  Ht 5\' 3"  (1.6 m)  Wt 150 lb (68.04 kg)  BMI 26.58 kg/m2  SpO2 96% Patient alert and oriented and in no cardiopulmonary distress.  HEENT: No facial asymmetry, EOMI,   oropharynx pink and moist.  Neck supple no JVD, no mass.  Chest: Clear to auscultation bilaterally.  CVS: S1, S2 no murmurs, no S3.Regular rate.  ABD: Soft non tender.   Ext: No edema  MS: Reduced ROM lumbar  Spine, adequate in  shoulders, hips and reduced in right  knee.  Skin: Intact, no ulcerations or rash noted.  Psych: Good eye contact, normal affect. Memory intact mildly  anxious not  depressed appearing.  CNS: CN 2-12 intact, power,  normal throughout.no focal deficits noted.       Assessment & Plan:  Knee pain, right Increased  Pain , swelling , and limitation in mobility, disturbing sleep and negatively affecting quality of life, refer to ortho  Chronic low back pain with sciatica Controlled on  current medication, pt in opioid pain contract with this office/ me  Anxiety Sister in with pt c/o increased anxiety and irritability in recent times, encouraged patience on sister's part , and that pt exercise restraint as able  Essential hypertension Controlled, no change in medication   Pancreatic cancer metastasized to liver Denies abdominal pain, has fairly good appetite , with steady weight. C/o excessive fatigue which is likely due to chemo currently still receiving

## 2015-06-01 ENCOUNTER — Telehealth: Payer: Self-pay | Admitting: Family Medicine

## 2015-06-01 DIAGNOSIS — M25561 Pain in right knee: Secondary | ICD-10-CM

## 2015-06-01 NOTE — Telephone Encounter (Signed)
Dr. Moshe Cipro referred her to Dr. Aline Brochure but they didn't have any apts. They told her to go to Dr. Luna Glasgow since they are merging. She wants [place] to fax the xrays to Dr. Briscoe Burns office.

## 2015-06-03 DIAGNOSIS — G8929 Other chronic pain: Secondary | ICD-10-CM | POA: Insufficient documentation

## 2015-06-03 DIAGNOSIS — M5440 Lumbago with sciatica, unspecified side: Secondary | ICD-10-CM

## 2015-06-03 NOTE — Assessment & Plan Note (Signed)
Sister in with pt c/o increased anxiety and irritability in recent times, encouraged patience on sister's part , and that pt exercise restraint as able

## 2015-06-03 NOTE — Assessment & Plan Note (Signed)
Increased  Pain , swelling , and limitation in mobility, disturbing sleep and negatively affecting quality of life, refer to ortho

## 2015-06-03 NOTE — Assessment & Plan Note (Signed)
Controlled, no change in medication  

## 2015-06-03 NOTE — Assessment & Plan Note (Signed)
Denies abdominal pain, has fairly good appetite , with steady weight. C/o excessive fatigue which is likely due to chemo currently still receiving

## 2015-06-03 NOTE — Assessment & Plan Note (Signed)
Controlled on current medication, pt in opioid pain contract with this office/ me

## 2015-06-04 NOTE — Telephone Encounter (Signed)
Referral entered for Dr. Luna Glasgow

## 2015-06-11 ENCOUNTER — Encounter: Payer: Medicare Other | Admitting: Family Medicine

## 2015-06-11 DIAGNOSIS — M25561 Pain in right knee: Secondary | ICD-10-CM | POA: Diagnosis not present

## 2015-06-11 DIAGNOSIS — Z6826 Body mass index (BMI) 26.0-26.9, adult: Secondary | ICD-10-CM | POA: Diagnosis not present

## 2015-06-11 DIAGNOSIS — I10 Essential (primary) hypertension: Secondary | ICD-10-CM | POA: Diagnosis not present

## 2015-06-11 DIAGNOSIS — M25562 Pain in left knee: Secondary | ICD-10-CM | POA: Diagnosis not present

## 2015-06-13 ENCOUNTER — Encounter (HOSPITAL_COMMUNITY): Payer: Self-pay | Admitting: Hematology & Oncology

## 2015-06-13 ENCOUNTER — Encounter (HOSPITAL_COMMUNITY): Payer: Medicare Other | Attending: Hematology and Oncology

## 2015-06-13 ENCOUNTER — Encounter (HOSPITAL_BASED_OUTPATIENT_CLINIC_OR_DEPARTMENT_OTHER): Payer: Medicare Other | Admitting: Hematology & Oncology

## 2015-06-13 ENCOUNTER — Ambulatory Visit (HOSPITAL_COMMUNITY): Payer: Medicare Other | Admitting: Hematology & Oncology

## 2015-06-13 VITALS — BP 155/66 | HR 68 | Temp 98.2°F | Resp 18 | Wt 148.0 lb

## 2015-06-13 VITALS — BP 158/78 | HR 64 | Temp 98.0°F | Resp 20

## 2015-06-13 DIAGNOSIS — C252 Malignant neoplasm of tail of pancreas: Secondary | ICD-10-CM | POA: Insufficient documentation

## 2015-06-13 DIAGNOSIS — C787 Secondary malignant neoplasm of liver and intrahepatic bile duct: Secondary | ICD-10-CM

## 2015-06-13 DIAGNOSIS — T451X5A Adverse effect of antineoplastic and immunosuppressive drugs, initial encounter: Secondary | ICD-10-CM

## 2015-06-13 DIAGNOSIS — C259 Malignant neoplasm of pancreas, unspecified: Secondary | ICD-10-CM

## 2015-06-13 DIAGNOSIS — Z5111 Encounter for antineoplastic chemotherapy: Secondary | ICD-10-CM | POA: Diagnosis present

## 2015-06-13 DIAGNOSIS — C801 Malignant (primary) neoplasm, unspecified: Secondary | ICD-10-CM | POA: Diagnosis not present

## 2015-06-13 DIAGNOSIS — G620 Drug-induced polyneuropathy: Secondary | ICD-10-CM

## 2015-06-13 LAB — CBC WITH DIFFERENTIAL/PLATELET
BASOS PCT: 0 %
Basophils Absolute: 0 10*3/uL (ref 0.0–0.1)
Eosinophils Absolute: 0 10*3/uL (ref 0.0–0.7)
Eosinophils Relative: 0 %
HEMATOCRIT: 32.6 % — AB (ref 36.0–46.0)
Hemoglobin: 10.6 g/dL — ABNORMAL LOW (ref 12.0–15.0)
LYMPHS ABS: 1.4 10*3/uL (ref 0.7–4.0)
Lymphocytes Relative: 12 %
MCH: 28.8 pg (ref 26.0–34.0)
MCHC: 32.5 g/dL (ref 30.0–36.0)
MCV: 88.6 fL (ref 78.0–100.0)
MONO ABS: 1 10*3/uL (ref 0.1–1.0)
MONOS PCT: 8 %
NEUTROS ABS: 9.9 10*3/uL — AB (ref 1.7–7.7)
Neutrophils Relative %: 80 %
Platelets: 340 10*3/uL (ref 150–400)
RBC: 3.68 MIL/uL — ABNORMAL LOW (ref 3.87–5.11)
RDW: 17.7 % — AB (ref 11.5–15.5)
WBC: 12.4 10*3/uL — ABNORMAL HIGH (ref 4.0–10.5)

## 2015-06-13 LAB — COMPREHENSIVE METABOLIC PANEL
ALBUMIN: 4 g/dL (ref 3.5–5.0)
ALK PHOS: 94 U/L (ref 38–126)
ALT: 12 U/L — AB (ref 14–54)
ANION GAP: 6 (ref 5–15)
AST: 19 U/L (ref 15–41)
BILIRUBIN TOTAL: 0.4 mg/dL (ref 0.3–1.2)
BUN: 19 mg/dL (ref 6–20)
CALCIUM: 9 mg/dL (ref 8.9–10.3)
CO2: 29 mmol/L (ref 22–32)
Chloride: 107 mmol/L (ref 101–111)
Creatinine, Ser: 1 mg/dL (ref 0.44–1.00)
GFR calc Af Amer: 58 mL/min — ABNORMAL LOW (ref >60)
GFR calc non Af Amer: 50 mL/min — ABNORMAL LOW (ref >60)
GLUCOSE: 125 mg/dL — AB (ref 65–99)
Potassium: 4.5 mmol/L (ref 3.5–5.1)
SODIUM: 142 mmol/L (ref 135–145)
TOTAL PROTEIN: 7.2 g/dL (ref 6.5–8.1)

## 2015-06-13 MED ORDER — SODIUM CHLORIDE 0.9 % IJ SOLN: 10.0000 mL | INTRAMUSCULAR | Status: DC | PRN

## 2015-06-13 MED ORDER — PACLITAXEL PROTEIN-BOUND CHEMO INJECTION 100 MG
125.0000 mg/m2 | Freq: Once | INTRAVENOUS | Status: AC
  Administered 2015-06-13: 225 mg via INTRAVENOUS
  Filled 2015-06-13: qty 45

## 2015-06-13 MED ORDER — GEMCITABINE HCL CHEMO INJECTION 1 GM/26.3ML
1000.0000 mg/m2 | Freq: Once | INTRAVENOUS | Status: AC
  Administered 2015-06-13: 1786 mg via INTRAVENOUS
  Filled 2015-06-13: qty 46.97

## 2015-06-13 MED ORDER — HEPARIN SOD (PORK) LOCK FLUSH 100 UNIT/ML IV SOLN
500.0000 [IU] | Freq: Once | INTRAVENOUS | Status: AC
  Administered 2015-06-13: 500 [IU] via INTRAVENOUS

## 2015-06-13 MED ORDER — DEXAMETHASONE SODIUM PHOSPHATE 10 MG/ML IJ SOLN: 10.0000 mg | Freq: Once | INTRAMUSCULAR | Status: DC

## 2015-06-13 MED ORDER — SODIUM CHLORIDE 0.9 % IV SOLN
Freq: Once | INTRAVENOUS | Status: AC
  Administered 2015-06-13: 8 mg via INTRAVENOUS
  Filled 2015-06-13: qty 4

## 2015-06-13 MED ORDER — HEPARIN SOD (PORK) LOCK FLUSH 100 UNIT/ML IV SOLN: 500.0000 [IU] | Freq: Once | INTRAVENOUS | Status: AC | PRN

## 2015-06-13 MED ORDER — SODIUM CHLORIDE 0.9 % IV SOLN: 8.0000 mg | Freq: Once | INTRAVENOUS | Status: DC

## 2015-06-13 MED ORDER — SODIUM CHLORIDE 0.9 % IV SOLN
Freq: Once | INTRAVENOUS | Status: AC
  Administered 2015-06-13: 12:00:00 via INTRAVENOUS

## 2015-06-13 NOTE — Progress Notes (Signed)
Patient tolerated infusion well.  VSS.   

## 2015-06-13 NOTE — Patient Instructions (Signed)
[facility] Discharge Instructions for Patients Receiving Chemotherapy   Beginning January 23rd 2017 lab work for the [facility] will be done in the  Main lab at [facility] on 1st floor. If you have a lab appointment with the [facility] please come in thru the  Main Entrance and check in at the main information desk   Today you received the following chemotherapy agents: Abraxane and Gemzar.     If you develop nausea and vomiting, or diarrhea that is not controlled by your medication, call the clinic.  The clinic phone number is [phone]. Office hours are Monday-Friday 8:30am-5:00pm.  BELOW ARE SYMPTOMS THAT SHOULD BE REPORTED IMMEDIATELY:  *FEVER GREATER THAN 101.0 F  *CHILLS WITH OR WITHOUT FEVER  NAUSEA AND VOMITING THAT IS NOT CONTROLLED WITH YOUR NAUSEA MEDICATION  *UNUSUAL SHORTNESS OF BREATH  *UNUSUAL BRUISING OR BLEEDING  TENDERNESS IN MOUTH AND THROAT WITH OR WITHOUT PRESENCE OF ULCERS  *URINARY PROBLEMS  *BOWEL PROBLEMS  UNUSUAL RASH Items with * indicate a potential emergency and should be followed up as soon as possible. If you have an emergency after office hours please contact your primary care physician or go to the nearest emergency department.  Please call the clinic during office hours if you have any questions or concerns.   You may also contact the Patient Navigator at [phone] should you have any questions or need assistance in obtaining follow up care.

## 2015-06-13 NOTE — Patient Instructions (Addendum)
[place] at [facility] Discharge Instructions  RECOMMENDATIONS MADE BY THE CONSULTANT AND ANY TEST RESULTS WILL BE SENT TO YOUR REFERRING PHYSICIAN.      Exam and discussion by Dr Whitney Muse today We are going to set you up for MRI  Everything is looking good.  Your therapy is doing what it is suppose to. Chemotherapy day 1 and day 8 every 28 days. Gemzar and Abraxane  Return to see the doctor in 1 month Please call the clinic if you have any questions or concerns     Thank you for choosing [place] at [facility] to provide your oncology and hematology care.  To afford each patient quality time with our provider, please arrive at least 15 minutes before your scheduled appointment time.   Beginning January 23rd 2017 lab work for the [name] will be done in the  Main lab at [facility] on 1st floor. If you have a lab appointment with the [facility] please come in thru the  Main Entrance and check in at the main information desk  You need to re-schedule your appointment should you arrive 10 or more minutes late.  We strive to give you quality time with our providers, and arriving late affects you and other patients whose appointments are after yours.  Also, if you no show three or more times for appointments you may be dismissed from the clinic at the providers discretion.     Again, thank you for choosing [facility].  Our hope is that these requests will decrease the amount of time that you wait before being seen by our physicians.       _____________________________________________________________  Should you have questions after your visit to [facility], please contact our office at [phone] between the hours of 8:30 a.m. and 4:30 p.m.  Voicemails left after 4:30 p.m. will not be returned until the following business day.  For prescription refill requests, have your pharmacy contact our office.

## 2015-06-13 NOTE — Progress Notes (Signed)
Pt is aware.  

## 2015-06-13 NOTE — Progress Notes (Signed)
[name] Nakayama, MD [address] [place] [address]   DIAGNOSIS: Pancreatic cancer metastasized to liver   Staging form: Pancreas, AJCC 7th Edition     Clinical: Stage IV (T3, N1, M1) - Signed by Baird Cancer, PA-C on 03/16/2014     Pathologic: No stage assigned - Unsigned  CONTRAST MEDIA ALLERGY  SUMMARY OF ONCOLOGIC HISTORY:   Pancreatic cancer metastasized to liver ([place])   03/10/2014 Initial Diagnosis Pancreatic cancer metastasized to liver   03/22/2014 -  Chemotherapy Abraxane/Gemzar days 1, 8, every 28 days.  Day 15 was cancelled due to leukopenia and thrombocytopenia on day 15 cycle 1.   05/24/2014 Treatment Plan Change Day 8 of cycle 3 is held with ANC of 1.1   06/05/2014 Imaging CT C/A/P . Interval decrease in size of the pancreatic tail mass.Improved hepatic metastatic disease. No new lesions. No CT findings for metastatic disease involving the chest.   08/09/2014 Tumor Marker CA 19-9= 33 (WNL)   10/26/2014 Imaging MRI- Continued interval decrease in size of the hepatic metastatic lesions and no new lesions are identified. Continued decrease in size of the pancreatic tail lesion.   01/03/2015 Tumor Marker Results for [name], [name] (MRN [id number]) as of 01/18/2015 12:52  01/03/2015 10:00 CA 19-9: 14    01/17/2015 Imaging MRI- Response to therapy of hepatic metastasis.  Similar size of a pancreatic tail lesion.   03/20/2015 Imaging MRI-L spine- Severe disc space narrowing at L2-L3, with endplate reactive changes. Large disc extrusion into the ventral epidural space,central to the RIGHT with a cephalad migrated free fragment. Additional Large disc extrusion into the retroperitone...   03/22/2015 Imaging CT pelvis- No evidence for metastatic disease within the pelvis.    CURRENT THERAPY: Gemzar/Abraxane  INTERVAL HISTORY: [name] 82 y.o. female returns for follow-up of her pancreatic cancer. She is doing fairly well. She notes that she attends church  weekly.  [name] is alone today and doing well. She is here for cycle #17 of gemzar/abraxane. I reviewed and discussed laboratory studies at length with the patient.   She went to see Dr. Luna Glasgow when both knees were hurting from arthritis. She received two cortisone shots. Her knees have felt better since.   Reports intermittent foot swelling. This does not impair her ability to walk.   Her previous abdominal pain has resolved.   Her son got her a LifeAlert, though she has never fallen.   She denies nausea and vomiting, constipation, diarrhea, change in appetite or energy level.    MEDICAL HISTORY: Past Medical History  Diagnosis Date  . Hypertension 20 years   . Chronic diarrhea   . Anxiety   . Depression   . Kidney stone     hx/ crushed   . Hx of adenomatous colonic polyps     tubular adenomas, last found in 2008  . GERD (gastroesophageal reflux disease)   . Erosive esophagitis   . Schatzki's ring 08/26/10    Last dilated on EGD by Dr. Trevor Iha [facility], linear gastric erosions, BI hemigastrectomy  . Hyperplastic colon polyp 03/19/10    tcs by Dr. Gala Romney  . Pancreatic cancer ([place]) 02/2014  . Pancreatic cancer metastasized to liver ([place]) 03/10/2014  . Opioid contract exists 04/18/2015    With Dr. Moshe Cipro    has Essential hypertension; GERD (gastroesophageal reflux disease); Anxiety; Primary generalized (osteo)arthritis; Back pain with radiation; IGT (impaired glucose tolerance); Anemia, iron deficiency; Elevated LFTs; Abnormal liver ultrasound; Pancreatic cancer metastasized to  liver ([place]); Allergic cough; Allergic rhinitis; Cough; Heme positive stool; Knee pain, right; Opioid contract exists; Hx of adenomatous colonic polyps; and Chronic low back pain with sciatica on her problem list.     is allergic to iohexol; aciphex; amlodipine besylate-valsartan; esomeprazole magnesium; omeprazole; penicillins; and ciprofloxacin.  [name] does not currently have medications on  file.  SURGICAL HISTORY: Past Surgical History  Procedure Laterality Date  . Stomach ulcer  50 years ago     had some of her stomach removed   . Bunion removal      from both feet   . Left shoulder surgery  2009    DR HARRISON  . Colonoscopy  03/19/2010    DR Gala Romney,, normal TI, pancolonic diverticula, random colon bx neg., hyperplastic polyps removed  . Esophagogastroduodenoscopy  11/22/2003    DR Gala Romney, erosive RE, Billroth I  . Esophagogastroduodenoscopy  08/26/10    Dr. Gala Romney- moderate severe ERE, Scahtzki ring s/p dilation, Billroth I, linear gastric erosions, bx-gastric xanthelasma  . Breast lumpectomy Left   . Orif ankle fracture Right 05/22/2013    Procedure: OPEN REDUCTION INTERNAL FIXATION (ORIF) RIGHT ANKLE FRACTURE;  Surgeon: Sanjuana Kava, MD;  Location: [facility];  Service: Orthopedics;  Laterality: Right;  . Cholecystectomy  1965     SOCIAL HISTORY: Social History   Social History  . Marital Status: Widowed    Spouse Name: N/A  . Number of Children: 2  . Years of Education: N/A   Occupational History  . retired from Teacher, adult education    Social History Main Topics  . Smoking status: Former Smoker    Types: Cigarettes    Quit date: 11/09/2011  . Smokeless tobacco: Never Used     Comment: quit a few weeks ago   . Alcohol Use: No  . Drug Use: No  . Sexual Activity: No   Other Topics Concern  . Not on file   Social History Narrative    FAMILY HISTORY: Family History  Problem Relation Age of Onset  . Hypertension Mother   . Kidney failure Brother     X1 ON DIALYSIS  . Diabetes Sister   . Cancer Sister     X2  . Hypertension Father   . Liver disease Neg Hx   . Colon cancer Neg Hx     Review of Systems  Constitutional: Negative for malaise/fatigue. HENT: Negative.   Eyes: Negative for vision change, double vision, photophobia, pain, discharge and redness.  Respiratory: Negative.   Cardiovascular: Positive for swelling. Intermittent foot swelling.     Gastrointestinal: Negative.   Genitourinary: Negative.  Musculoskeletal: Positive for joint pain.  Skin: Negative.   Neurological: Negative for weakness, dizziness, tingling, tremors, sensory change, speech change, focal weakness, seizures and loss of consciousness. Endo/Heme/Allergies: Negative.   Psychiatric/Behavioral: Negative.   14 point review of systems was performed and is negative except as detailed under history of present illness and above   PHYSICAL EXAMINATION  ECOG PERFORMANCE STATUS: 0 - Asymptomatic  Filed Vitals:   06/13/15 0913  BP: 155/66  Pulse: 68  Temp: 98.2 F (36.8 C)  Resp: 18    Physical Exam  Constitutional: She is oriented to person, place, and time and well-developed, well-nourished, and in no distress.  Well groomed. HENT:  Head: Normocephalic and atraumatic.  Nose: Nose normal.  Mouth/Throat: Oropharynx is clear and moist. No oropharyngeal exudate.  Eyes: Conjunctivae and EOM are normal. Pupils are equal, round, and reactive to light. Right eye exhibits no discharge.  Left eye exhibits no discharge. No scleral icterus.  Neck: Normal range of motion. Neck supple. No tracheal deviation present. No thyromegaly present.  Cardiovascular: Normal rate, regular rhythm and normal heart sounds.  Exam reveals no gallop and no friction rub.   No murmur heard. Pulmonary/Chest: Effort normal and breath sounds normal. She has no wheezes. She has no rales.  Abdominal: Soft. Bowel sounds are normal. She exhibits no distension and no mass. There is no tenderness. There is no rebound and no guarding.  Musculoskeletal: Normal range of motion. She exhibits no edema.  Lymphadenopathy:    She has no cervical adenopathy.  Neurological: She is alert and oriented to person, place, and time. She has normal reflexes. No cranial nerve deficit. Gait normal. Coordination normal.  Skin: Skin is warm and dry. No rash noted.  Psychiatric: Mood, memory, affect and judgment  normal.  Nursing note and vitals reviewed.   LABORATORY DATA: I have reviewed the data as listed. Results for [name], [name] (MRN [id number]) as of 06/13/2015 10:04  Ref. Range 06/13/2015 09:29  WBC Latest Ref Range: 4.0-10.5 K/uL 12.4 (H)  RBC Latest Ref Range: 3.87-5.11 MIL/uL 3.68 (L)  Hemoglobin Latest Ref Range: 12.0-15.0 g/dL 10.6 (L)  HCT Latest Ref Range: 36.0-46.0 % 32.6 (L)  MCV Latest Ref Range: 78.0-100.0 fL 88.6  MCH Latest Ref Range: 26.0-34.0 pg 28.8  MCHC Latest Ref Range: 30.0-36.0 g/dL 32.5  RDW Latest Ref Range: 11.5-15.5 % 17.7 (H)  Platelets Latest Ref Range: 150-400 K/uL 340  Neutrophils Latest Units: % 80  Lymphocytes Latest Units: % 12  Monocytes Relative Latest Units: % 8  Eosinophil Latest Units: % 0  Basophil Latest Units: % 0  NEUT# Latest Ref Range: 1.7-7.7 K/uL 9.9 (H)  Lymphocyte # Latest Ref Range: 0.7-4.0 K/uL 1.4  Monocyte # Latest Ref Range: 0.1-1.0 K/uL 1.0  Eosinophils Absolute Latest Ref Range: 0.0-0.7 K/uL 0.0  Basophils Absolute Latest Ref Range: 0.0-0.1 K/uL 0.0   RADIOLOGY: I have personally reviewed the radiological images as listed and agreed with the findings in the report.  Study Result     CLINICAL DATA: Patient with history of stage IV pancreatic cancer diagnosed 2015. On chemotherapy. New right hip pain.  EXAM: CT PELVIS WITH CONTRAST  TECHNIQUE: Multidetector CT imaging of the pelvis was performed using the standard protocol following the bolus administration of intravenous contrast.  CONTRAST: 13mL OMNIPAQUE IOHEXOL 300 MG/ML SOLN  COMPARISON: CT abdomen pelvis 07/19/2014  FINDINGS: Visualized distal abdominal aorta is normal in caliber with peripheral calcified atherosclerotic plaque. Uterus and adnexal structures are unremarkable. Trace free fluid in the pelvis. Urinary bladder is unremarkable. The visualized large and small bowel is unremarkable. No retroperitoneal or pelvic lymphadenopathy.  Lumbar spine degenerative changes. No aggressive or acute appearing osseous lesions. L4-5 posterior disc osteophyte complex.  IMPRESSION: No evidence for metastatic disease within the pelvis.   Electronically Signed  By: Lovey Newcomer M.D.  On: 03/22/2015 15:08     ASSESSMENT and THERAPY PLAN:   STAGE IV Pancreatic Cancer Ongoing response to therapy Mild chemotherapy induced neuropathy Anemia Hemoccult positive stool  81 year old female on Gemzar/Abraxane for stage IV pancreatic cancer. She is doing and has done remarkably well.  She would like to continue with therapy. Her performance status remains a 1. It is time for repeat imaging and she agrees.  We will plan on moving forward with therapy.  We will arrange for repeat staging studies and see her back post.   She does  not need refills today.  All questions were answered. The patient knows to call the clinic with any problems, questions or concerns. We can certainly see the patient much sooner if necessary.  This document serves as a record of services personally performed by Ancil Linsey, MD. It was created on her behalf by Arlyce Harman, a trained medical scribe. The creation of this record is based on the scribe's personal observations and the provider's statements to them. This document has been checked and approved by the attending provider.  I have reviewed the above documentation for accuracy and completeness, and I agree with the above.  This note was signed electronically. Molli Hazard, MD  06/13/2015

## 2015-06-14 LAB — CANCER ANTIGEN 19-9: CA 19-9: 17 U/mL (ref 0–35)

## 2015-06-19 ENCOUNTER — Other Ambulatory Visit: Payer: Self-pay

## 2015-06-19 MED ORDER — HYDROCODONE-ACETAMINOPHEN 5-325 MG PO TABS: ORAL_TABLET | ORAL | Status: DC

## 2015-06-20 ENCOUNTER — Encounter (HOSPITAL_BASED_OUTPATIENT_CLINIC_OR_DEPARTMENT_OTHER): Payer: Medicare Other

## 2015-06-20 ENCOUNTER — Encounter (HOSPITAL_COMMUNITY): Payer: Self-pay

## 2015-06-20 VITALS — BP 146/64 | HR 65 | Temp 98.4°F | Resp 20 | Wt 145.6 lb

## 2015-06-20 DIAGNOSIS — Z5111 Encounter for antineoplastic chemotherapy: Secondary | ICD-10-CM

## 2015-06-20 DIAGNOSIS — C787 Secondary malignant neoplasm of liver and intrahepatic bile duct: Secondary | ICD-10-CM | POA: Diagnosis not present

## 2015-06-20 DIAGNOSIS — C259 Malignant neoplasm of pancreas, unspecified: Secondary | ICD-10-CM | POA: Diagnosis not present

## 2015-06-20 DIAGNOSIS — C801 Malignant (primary) neoplasm, unspecified: Secondary | ICD-10-CM | POA: Diagnosis not present

## 2015-06-20 DIAGNOSIS — C252 Malignant neoplasm of tail of pancreas: Secondary | ICD-10-CM | POA: Diagnosis not present

## 2015-06-20 LAB — CBC WITH DIFFERENTIAL/PLATELET
BASOS ABS: 0 10*3/uL (ref 0.0–0.1)
BASOS PCT: 1 %
EOS ABS: 0 10*3/uL (ref 0.0–0.7)
Eosinophils Relative: 1 %
HCT: 31 % — ABNORMAL LOW (ref 36.0–46.0)
HEMOGLOBIN: 10.1 g/dL — AB (ref 12.0–15.0)
Lymphocytes Relative: 39 %
Lymphs Abs: 1.5 10*3/uL (ref 0.7–4.0)
MCH: 28.5 pg (ref 26.0–34.0)
MCHC: 32.6 g/dL (ref 30.0–36.0)
MCV: 87.3 fL (ref 78.0–100.0)
Monocytes Absolute: 0.4 10*3/uL (ref 0.1–1.0)
Monocytes Relative: 11 %
NEUTROS PCT: 48 %
Neutro Abs: 1.9 10*3/uL (ref 1.7–7.7)
Platelets: 127 10*3/uL — ABNORMAL LOW (ref 150–400)
RBC: 3.55 MIL/uL — AB (ref 3.87–5.11)
RDW: 16.6 % — ABNORMAL HIGH (ref 11.5–15.5)
WBC: 3.8 10*3/uL — AB (ref 4.0–10.5)

## 2015-06-20 LAB — COMPREHENSIVE METABOLIC PANEL
ALBUMIN: 3.7 g/dL (ref 3.5–5.0)
ALK PHOS: 82 U/L (ref 38–126)
ALT: 14 U/L (ref 14–54)
ANION GAP: 7 (ref 5–15)
AST: 15 U/L (ref 15–41)
BUN: 17 mg/dL (ref 6–20)
CALCIUM: 8.8 mg/dL — AB (ref 8.9–10.3)
CHLORIDE: 107 mmol/L (ref 101–111)
CO2: 26 mmol/L (ref 22–32)
CREATININE: 1 mg/dL (ref 0.44–1.00)
GFR calc Af Amer: 58 mL/min — ABNORMAL LOW (ref >60)
GFR calc non Af Amer: 50 mL/min — ABNORMAL LOW (ref >60)
GLUCOSE: 128 mg/dL — AB (ref 65–99)
Potassium: 4.1 mmol/L (ref 3.5–5.1)
SODIUM: 140 mmol/L (ref 135–145)
Total Bilirubin: 0.5 mg/dL (ref 0.3–1.2)
Total Protein: 6.4 g/dL — ABNORMAL LOW (ref 6.5–8.1)

## 2015-06-20 MED ORDER — HEPARIN SOD (PORK) LOCK FLUSH 100 UNIT/ML IV SOLN
INTRAVENOUS | Status: AC
  Filled 2015-06-20: qty 5

## 2015-06-20 MED ORDER — SODIUM CHLORIDE 0.9 % IV SOLN
1000.0000 mg/m2 | Freq: Once | INTRAVENOUS | Status: AC
  Administered 2015-06-20: 1786 mg via INTRAVENOUS
  Filled 2015-06-20: qty 46.97

## 2015-06-20 MED ORDER — DEXAMETHASONE SODIUM PHOSPHATE 10 MG/ML IJ SOLN: 10.0000 mg | Freq: Once | INTRAMUSCULAR | Status: DC

## 2015-06-20 MED ORDER — SODIUM CHLORIDE 0.9 % IJ SOLN
10.0000 mL | INTRAMUSCULAR | Status: DC | PRN
  Administered 2015-06-20: 10 mL
  Filled 2015-06-20: qty 10

## 2015-06-20 MED ORDER — SODIUM CHLORIDE 0.9 % IV SOLN
Freq: Once | INTRAVENOUS | Status: AC
  Administered 2015-06-20: 13:00:00 via INTRAVENOUS

## 2015-06-20 MED ORDER — SODIUM CHLORIDE 0.9 % IV SOLN
Freq: Once | INTRAVENOUS | Status: AC
  Administered 2015-06-20: 13:00:00 via INTRAVENOUS
  Filled 2015-06-20: qty 4

## 2015-06-20 MED ORDER — HEPARIN SOD (PORK) LOCK FLUSH 100 UNIT/ML IV SOLN
500.0000 [IU] | Freq: Once | INTRAVENOUS | Status: AC | PRN
  Administered 2015-06-20: 500 [IU]

## 2015-06-20 MED ORDER — PACLITAXEL PROTEIN-BOUND CHEMO INJECTION 100 MG
125.0000 mg/m2 | Freq: Once | INTRAVENOUS | Status: AC
  Administered 2015-06-20: 225 mg via INTRAVENOUS
  Filled 2015-06-20: qty 45

## 2015-06-20 MED ORDER — SODIUM CHLORIDE 0.9 % IV SOLN: 8.0000 mg | Freq: Once | INTRAVENOUS | Status: DC

## 2015-06-20 NOTE — Patient Instructions (Signed)
[facility] Discharge Instructions for Patients Receiving Chemotherapy  Today you received the following chemotherapy agents Abraxane and Gemzar Day 8.  To help prevent nausea and vomiting after your treatment, we encourage you to take your nausea medication as instructed. If you develop nausea and vomiting that is not controlled by your nausea medication, call the clinic. If it is after clinic hours your family physician or the after hours number for the clinic or go to the Emergency Department. BELOW ARE SYMPTOMS THAT SHOULD BE REPORTED IMMEDIATELY:  *FEVER GREATER THAN 101.0 F  *CHILLS WITH OR WITHOUT FEVER  NAUSEA AND VOMITING THAT IS NOT CONTROLLED WITH YOUR NAUSEA MEDICATION  *UNUSUAL SHORTNESS OF BREATH  *UNUSUAL BRUISING OR BLEEDING  TENDERNESS IN MOUTH AND THROAT WITH OR WITHOUT PRESENCE OF ULCERS  *URINARY PROBLEMS  *BOWEL PROBLEMS  UNUSUAL RASH Items with * indicate a potential emergency and should be followed up as soon as possible.  Return as scheduled.  I have been informed and understand all the instructions given to me. I know to contact the clinic, my physician, or go to the Emergency Department if any problems should occur. I do not have any questions at this time, but understand that I may call the clinic during office hours or the Patient Navigator at [phone] should I have any questions or need assistance in obtaining follow up care.    __________________________________________  _____________  __________ Signature of Patient or Authorized Representative            Date                   Time    __________________________________________ Nurse's Signature  

## 2015-06-20 NOTE — Progress Notes (Signed)
Tolerated chemo well. Ambulatory on discharge home to self. 

## 2015-06-22 ENCOUNTER — Telehealth (HOSPITAL_COMMUNITY): Payer: Self-pay | Admitting: *Deleted

## 2015-06-22 NOTE — Telephone Encounter (Signed)
[name] states her son is to send release papers here for Dr. Whitney Muse to sign for him to come here to check on her.  Have these been received or does anyone know anything about this

## 2015-06-28 ENCOUNTER — Other Ambulatory Visit: Payer: Self-pay | Admitting: Family Medicine

## 2015-06-28 DIAGNOSIS — Z1231 Encounter for screening mammogram for malignant neoplasm of breast: Secondary | ICD-10-CM

## 2015-07-09 ENCOUNTER — Ambulatory Visit (HOSPITAL_COMMUNITY): Payer: Medicare Other

## 2015-07-09 DIAGNOSIS — Z961 Presence of intraocular lens: Secondary | ICD-10-CM | POA: Diagnosis not present

## 2015-07-09 DIAGNOSIS — H35033 Hypertensive retinopathy, bilateral: Secondary | ICD-10-CM | POA: Diagnosis not present

## 2015-07-09 DIAGNOSIS — I10 Essential (primary) hypertension: Secondary | ICD-10-CM | POA: Diagnosis not present

## 2015-07-09 DIAGNOSIS — H04123 Dry eye syndrome of bilateral lacrimal glands: Secondary | ICD-10-CM | POA: Diagnosis not present

## 2015-07-10 ENCOUNTER — Other Ambulatory Visit: Payer: Self-pay

## 2015-07-10 ENCOUNTER — Ambulatory Visit (INDEPENDENT_AMBULATORY_CARE_PROVIDER_SITE_OTHER): Payer: Medicare Other | Admitting: Orthopaedic Surgery

## 2015-07-10 VITALS — BP 156/75 | HR 62 | Temp 97.5°F | Ht 63.0 in | Wt 150.0 lb

## 2015-07-10 DIAGNOSIS — M25571 Pain in right ankle and joints of right foot: Secondary | ICD-10-CM

## 2015-07-10 DIAGNOSIS — M25561 Pain in right knee: Secondary | ICD-10-CM

## 2015-07-10 DIAGNOSIS — M549 Dorsalgia, unspecified: Secondary | ICD-10-CM

## 2015-07-10 MED ORDER — GABAPENTIN 100 MG PO CAPS: 100.0000 mg | ORAL_CAPSULE | Freq: Every day | ORAL | Status: DC

## 2015-07-10 NOTE — Progress Notes (Signed)
Patient [id number] [name], female DOB:10-26-1928, 82 y.o. [id number]  Chief Complaint  Patient presents with  . Knee Pain    Bilateral knee pain s/p injection  . Ankle Pain    Right anke stings    HPI  [name] is a 82 y.o. female who has been seen in the past for right knee pain and right ankle pain. I gave her an injection last time to the right knee and it is doing very well with no problem now. She has no pain, no limp, no swelling.  Her right ankle is also doing well.  HPI  Body mass index is 26.58 kg/(m^2).  Review of Systems  Constitutional:       Patient does not have Diabetes Mellitus. Patient has hypertension. Patient does not have COPD or shortness of breath. Patient does not have BMI > 35. Patient has current smoking history.  Gastrointestinal:       GERD     Past Medical History  Diagnosis Date  . Hypertension 20 years   . Chronic diarrhea   . Anxiety   . Depression   . Kidney stone     hx/ crushed   . Hx of adenomatous colonic polyps     tubular adenomas, last found in 2008  . GERD (gastroesophageal reflux disease)   . Erosive esophagitis   . Schatzki's ring 08/26/10    Last dilated on EGD by Dr. Trevor Iha [facility], linear gastric erosions, BI hemigastrectomy  . Hyperplastic colon polyp 03/19/10    tcs by Dr. Gala Romney  . Pancreatic cancer ([place]) 02/2014  . Pancreatic cancer metastasized to liver ([place]) 03/10/2014  . Opioid contract exists 04/18/2015    With Dr. Moshe Cipro    Past Surgical History  Procedure Laterality Date  . Stomach ulcer  50 years ago     had some of her stomach removed   . Bunion removal      from both feet   . Left shoulder surgery  2009    DR HARRISON  . Colonoscopy  03/19/2010    DR Gala Romney,, normal TI, pancolonic diverticula, random colon bx neg., hyperplastic polyps removed  . Esophagogastroduodenoscopy  11/22/2003    DR Gala Romney, erosive RE, Billroth I  . Esophagogastroduodenoscopy  08/26/10    Dr. Gala Romney- moderate severe  ERE, Scahtzki ring s/p dilation, Billroth I, linear gastric erosions, bx-gastric xanthelasma  . Breast lumpectomy Left   . Orif ankle fracture Right 05/22/2013    Procedure: OPEN REDUCTION INTERNAL FIXATION (ORIF) RIGHT ANKLE FRACTURE;  Surgeon: Sanjuana Kava, MD;  Location: [facility];  Service: Orthopedics;  Laterality: Right;  . Cholecystectomy  1965     Family History  Problem Relation Age of Onset  . Hypertension Mother   . Kidney failure Brother     X1 ON DIALYSIS  . Diabetes Sister   . Cancer Sister     X2  . Hypertension Father   . Liver disease Neg Hx   . Colon cancer Neg Hx     Social History Social History  Substance Use Topics  . Smoking status: Former Smoker    Types: Cigarettes    Quit date: 11/09/2011  . Smokeless tobacco: Never Used     Comment: quit a few weeks ago   . Alcohol Use: No    Allergies  Allergen Reactions  . Iohexol Hives and Shortness Of Breath    PATIENT HAD TO BE TAKEN TO ED, C/O WHELPS, HIVES, DIFFICULTY BREATHING  . Aciphex [Rabeprazole Sodium] Other (See Comments)  unknown  . Amlodipine Besylate-Valsartan     Rash to high-dose (5/320)  . Esomeprazole Magnesium   . Omeprazole Other (See Comments)    Patient states "medication didn't work"  . Penicillins Other (See Comments)    unknown  . Ciprofloxacin Rash    Current Outpatient Prescriptions  Medication Sig Dispense Refill  . amLODipine-olmesartan (AZOR) 10-40 MG tablet Take 1 tablet by mouth daily. 90 tablet 0  . aspirin 81 MG EC tablet Take 81 mg by mouth every morning.     Marland Kitchen azelastine (ASTELIN) 0.1 % nasal spray Place 2 sprays into both nostrils 2 (two) times daily. Use in each nostril as directed 30 mL 12  . cetirizine (ZYRTEC) 10 MG tablet Take 1 tablet (10 mg total) by mouth every morning. 30 tablet 3  . cholestyramine light (PREVALITE) 4 GM/DOSE powder TAKE 1 SCOOP BY MOUTH DAILY 239.4 g 3  . clonazePAM (KLONOPIN) 0.5 MG tablet TAKE 1 TABLET BY MOUTH TWICE DAILY AS NEEDED  FOR ANXIETY 60 tablet 2  . cyclobenzaprine (FLEXERIL) 10 MG tablet Take 1 tablet (10 mg total) by mouth 2 (two) times daily as needed for muscle spasms. 30 tablet 1  . cycloSPORINE (RESTASIS) 0.05 % ophthalmic emulsion Place 1 drop into both eyes 2 (two) times daily.    . diphenhydrAMINE (BENADRYL) 50 MG tablet 50 mg PO 1 hour prior to CT scan 1 tablet 0  . ferrous sulfate 325 (65 FE) MG tablet Take 325 mg by mouth daily with breakfast.    . fluticasone (FLONASE) 50 MCG/ACT nasal spray Place 2 sprays into both nostrils daily.    Marland Kitchen gabapentin (NEURONTIN) 100 MG capsule Take 1 capsule (100 mg total) by mouth at bedtime. 30 capsule 3  . HYDROcodone-acetaminophen (NORCO/VICODIN) 5-325 MG tablet One tablet twice daily 60 tablet 0  . lidocaine-prilocaine (EMLA) cream Apply a quarter size amount to port site 1 hour prior to chemo. Do not rub in. Cover with plastic wrap. 30 g 3  . montelukast (SINGULAIR) 10 MG tablet Take 1 tablet (10 mg total) by mouth at bedtime. 30 tablet 3  . nebivolol (BYSTOLIC) 10 MG tablet Take 1 tablet (10 mg total) by mouth daily. 90 tablet 1  . Omega-3 Fatty Acids (OMEGA 3 PO) Take by mouth.    . pantoprazole (PROTONIX) 20 MG tablet TAKE 1 TABLET(20 MG) BY MOUTH DAILY 90 tablet 0  . potassium chloride (MICRO-K) 10 MEQ CR capsule Take 1 capsule (10 mEq total) by mouth 2 (two) times daily. 60 capsule 1  . Probiotic Product (HEALTHY COLON PO) Take 1 tablet by mouth every morning.     . prochlorperazine (COMPAZINE) 10 MG tablet Take 1 tablet (10 mg total) by mouth every 6 (six) hours as needed for nausea or vomiting. 60 tablet 2  . senna (SENOKOT) 8.6 MG tablet Take 1 tablet by mouth daily.     No current facility-administered medications for this visit.   Facility-Administered Medications Ordered in Other Visits  Medication Dose Route Frequency Provider Last Rate Last Dose  . dexamethasone (DECADRON) 10 mg in sodium chloride 0.9 % 50 mL IVPB   Intravenous Once Patrici Ranks,  MD         Physical Exam  Blood pressure 156/75, pulse 62, temperature 97.5 F (36.4 C), height 5\' 3"  (1.6 m), weight 150 lb (68.04 kg).  Constitutional: overall normal hygiene, normal nutrition, well developed, normal grooming, normal body habitus. Assistive device:none  Musculoskeletal: gait and station Limp none, muscle tone  and strength are normal, no tremors or atrophy is present.  .  Neurological: coordination overall normal.  Deep tendon reflex/nerve stretch intact.  Sensation normal.  Cranial nerves II-XII intact.   Skin:   normal overall no scars, lesions, ulcers or rashes. No psoriasis.  Psychiatric: Alert and oriented x 3.  Recent memory intact, remote memory unclear.  Normal mood and affect. Well groomed.  Good eye contact.  Cardiovascular: overall no swelling, no varicosities, no edema bilaterally, normal temperatures of the legs and arms, no clubbing, cyanosis and good capillary refill.  Lymphatic: palpation is normal.   Extremities:Both knees have no pain and so do both ankles. Inspection normal knees and ankles Strength and tone full of both knees and ankles Range of motion full of both knees and ankles   The patient has been educated about the nature of the problem(s) and counseled on treatment options.  The patient appeared to understand what I have discussed and is in agreement with it.  PLAN Call if any problems.  Precautions discussed.  Continue current medications.   Return to clinic PRN

## 2015-07-11 ENCOUNTER — Encounter (HOSPITAL_COMMUNITY): Payer: Medicare Other | Attending: Hematology and Oncology

## 2015-07-11 ENCOUNTER — Encounter (HOSPITAL_BASED_OUTPATIENT_CLINIC_OR_DEPARTMENT_OTHER): Payer: Medicare Other | Admitting: Hematology & Oncology

## 2015-07-11 ENCOUNTER — Encounter (HOSPITAL_COMMUNITY): Payer: Self-pay | Admitting: Hematology & Oncology

## 2015-07-11 ENCOUNTER — Ambulatory Visit (HOSPITAL_COMMUNITY): Payer: Medicare Other | Admitting: Hematology & Oncology

## 2015-07-11 VITALS — BP 154/63 | HR 56 | Temp 98.2°F | Resp 18 | Wt 148.8 lb

## 2015-07-11 VITALS — BP 147/64 | HR 61 | Temp 97.7°F | Resp 18

## 2015-07-11 DIAGNOSIS — T50995D Adverse effect of other drugs, medicaments and biological substances, subsequent encounter: Secondary | ICD-10-CM

## 2015-07-11 DIAGNOSIS — C259 Malignant neoplasm of pancreas, unspecified: Secondary | ICD-10-CM | POA: Diagnosis not present

## 2015-07-11 DIAGNOSIS — D649 Anemia, unspecified: Secondary | ICD-10-CM

## 2015-07-11 DIAGNOSIS — C787 Secondary malignant neoplasm of liver and intrahepatic bile duct: Secondary | ICD-10-CM

## 2015-07-11 DIAGNOSIS — C801 Malignant (primary) neoplasm, unspecified: Secondary | ICD-10-CM | POA: Insufficient documentation

## 2015-07-11 DIAGNOSIS — Z5111 Encounter for antineoplastic chemotherapy: Secondary | ICD-10-CM | POA: Diagnosis present

## 2015-07-11 DIAGNOSIS — R195 Other fecal abnormalities: Secondary | ICD-10-CM

## 2015-07-11 DIAGNOSIS — C252 Malignant neoplasm of tail of pancreas: Secondary | ICD-10-CM | POA: Insufficient documentation

## 2015-07-11 DIAGNOSIS — G620 Drug-induced polyneuropathy: Secondary | ICD-10-CM

## 2015-07-11 DIAGNOSIS — M549 Dorsalgia, unspecified: Secondary | ICD-10-CM

## 2015-07-11 LAB — COMPREHENSIVE METABOLIC PANEL
ALK PHOS: 83 U/L (ref 38–126)
ALT: 12 U/L — AB (ref 14–54)
ANION GAP: 8 (ref 5–15)
AST: 16 U/L (ref 15–41)
Albumin: 3.7 g/dL (ref 3.5–5.0)
BILIRUBIN TOTAL: 0.5 mg/dL (ref 0.3–1.2)
BUN: 15 mg/dL (ref 6–20)
CALCIUM: 8.6 mg/dL — AB (ref 8.9–10.3)
CO2: 27 mmol/L (ref 22–32)
CREATININE: 0.95 mg/dL (ref 0.44–1.00)
Chloride: 106 mmol/L (ref 101–111)
GFR calc non Af Amer: 53 mL/min — ABNORMAL LOW (ref >60)
GLUCOSE: 126 mg/dL — AB (ref 65–99)
Potassium: 4.2 mmol/L (ref 3.5–5.1)
Sodium: 141 mmol/L (ref 135–145)
TOTAL PROTEIN: 7 g/dL (ref 6.5–8.1)

## 2015-07-11 LAB — CBC WITH DIFFERENTIAL/PLATELET
Basophils Absolute: 0 10*3/uL (ref 0.0–0.1)
Basophils Relative: 1 %
Eosinophils Absolute: 0.1 10*3/uL (ref 0.0–0.7)
Eosinophils Relative: 1 %
HEMATOCRIT: 30.8 % — AB (ref 36.0–46.0)
HEMOGLOBIN: 10 g/dL — AB (ref 12.0–15.0)
LYMPHS ABS: 0.8 10*3/uL (ref 0.7–4.0)
LYMPHS PCT: 12 %
MCH: 28.3 pg (ref 26.0–34.0)
MCHC: 32.5 g/dL (ref 30.0–36.0)
MCV: 87.3 fL (ref 78.0–100.0)
MONOS PCT: 8 %
Monocytes Absolute: 0.5 10*3/uL (ref 0.1–1.0)
NEUTROS ABS: 5.2 10*3/uL (ref 1.7–7.7)
NEUTROS PCT: 78 %
Platelets: 259 10*3/uL (ref 150–400)
RBC: 3.53 MIL/uL — AB (ref 3.87–5.11)
RDW: 17.3 % — ABNORMAL HIGH (ref 11.5–15.5)
WBC: 6.7 10*3/uL (ref 4.0–10.5)

## 2015-07-11 LAB — URINALYSIS, ROUTINE W REFLEX MICROSCOPIC
Bilirubin Urine: NEGATIVE
GLUCOSE, UA: NEGATIVE mg/dL
KETONES UR: NEGATIVE mg/dL
LEUKOCYTES UA: NEGATIVE
NITRITE: NEGATIVE
PH: 6 (ref 5.0–8.0)
Protein, ur: 30 mg/dL — AB
SPECIFIC GRAVITY, URINE: 1.02 (ref 1.005–1.030)

## 2015-07-11 LAB — URINE MICROSCOPIC-ADD ON

## 2015-07-11 MED ORDER — SODIUM CHLORIDE 0.9 % IV SOLN
Freq: Once | INTRAVENOUS | Status: AC
  Administered 2015-07-11: 8 mg via INTRAVENOUS
  Filled 2015-07-11: qty 4

## 2015-07-11 MED ORDER — SULFAMETHOXAZOLE-TRIMETHOPRIM 800-160 MG PO TABS: 1.0000 | ORAL_TABLET | Freq: Two times a day (BID) | ORAL | Status: DC

## 2015-07-11 MED ORDER — DEXAMETHASONE SODIUM PHOSPHATE 10 MG/ML IJ SOLN: 10.0000 mg | Freq: Once | INTRAMUSCULAR | Status: DC

## 2015-07-11 MED ORDER — PACLITAXEL PROTEIN-BOUND CHEMO INJECTION 100 MG
125.0000 mg/m2 | Freq: Once | INTRAVENOUS | Status: AC
  Administered 2015-07-11: 225 mg via INTRAVENOUS
  Filled 2015-07-11: qty 45

## 2015-07-11 MED ORDER — HEPARIN SOD (PORK) LOCK FLUSH 100 UNIT/ML IV SOLN: 500.0000 [IU] | Freq: Once | INTRAVENOUS | Status: AC | PRN

## 2015-07-11 MED ORDER — SODIUM CHLORIDE 0.9 % IJ SOLN: 10.0000 mL | INTRAMUSCULAR | Status: DC | PRN

## 2015-07-11 MED ORDER — SODIUM CHLORIDE 0.9 % IV SOLN
Freq: Once | INTRAVENOUS | Status: AC
  Administered 2015-07-11: 11:00:00 via INTRAVENOUS

## 2015-07-11 MED ORDER — SODIUM CHLORIDE 0.9 % IV SOLN
1000.0000 mg/m2 | Freq: Once | INTRAVENOUS | Status: AC
  Administered 2015-07-11: 1786 mg via INTRAVENOUS
  Filled 2015-07-11: qty 46.97

## 2015-07-11 MED ORDER — SODIUM CHLORIDE 0.9 % IV SOLN: 8.0000 mg | Freq: Once | INTRAVENOUS | Status: DC

## 2015-07-11 MED ORDER — HEPARIN SOD (PORK) LOCK FLUSH 100 UNIT/ML IV SOLN
500.0000 [IU] | Freq: Once | INTRAVENOUS | Status: AC
  Administered 2015-07-11: 500 [IU] via INTRAVENOUS

## 2015-07-11 MED ORDER — HEPARIN SOD (PORK) LOCK FLUSH 100 UNIT/ML IV SOLN
INTRAVENOUS | Status: AC
  Filled 2015-07-11: qty 5

## 2015-07-11 NOTE — Progress Notes (Signed)
Patient tolerated infusion well.  VSS.  Cipro faxed into her pharmacy for bacteria in urine sample.  Patient is aware and educated on s/s to report to [place] should her symptoms worsen.

## 2015-07-11 NOTE — Patient Instructions (Addendum)
[place] at [facility] Discharge Instructions  RECOMMENDATIONS MADE BY THE CONSULTANT AND ANY TEST RESULTS WILL BE SENT TO YOUR REFERRING PHYSICIAN.   Exam and discussion by Dr Whitney Muse today We are going to get a urinalysis today Keep using your eye drops for your dry eyes Labs look good  Chemotherapy today (Day 1) Return next week for day 8 of chemotherapy  MRI scheduled  Return to see the doctor after your MRI Please call the clinic if you have any questions or concerns     Thank you for choosing [facility] at [facility] to provide your oncology and hematology care.  To afford each patient quality time with our provider, please arrive at least 15 minutes before your scheduled appointment time.   Beginning January 23rd 2017 lab work for the [name] will be done in the  Main lab at [facility] on 1st floor. If you have a lab appointment with the [facility] please come in thru the  Main Entrance and check in at the main information desk  You need to re-schedule your appointment should you arrive 10 or more minutes late.  We strive to give you quality time with our providers, and arriving late affects you and other patients whose appointments are after yours.  Also, if you no show three or more times for appointments you may be dismissed from the clinic at the providers discretion.     Again, thank you for choosing [facility].  Our hope is that these requests will decrease the amount of time that you wait before being seen by our physicians.       _____________________________________________________________  Should you have questions after your visit to [facility], please contact our office at [phone] between the hours of 8:30 a.m. and 4:30 p.m.  Voicemails left after 4:30 p.m. will not be returned until the following business day.  For prescription refill requests, have your pharmacy contact our  office.

## 2015-07-11 NOTE — Progress Notes (Signed)
[name] Nakayama, MD [address] [place] [address]   DIAGNOSIS: Pancreatic cancer metastasized to liver   Staging form: Pancreas, AJCC 7th Edition     Clinical: Stage IV (T3, N1, M1) - Signed by Baird Cancer, PA-C on 03/16/2014     Pathologic: No stage assigned - Unsigned  CONTRAST MEDIA ALLERGY  SUMMARY OF ONCOLOGIC HISTORY:   Pancreatic cancer metastasized to liver ([place])   03/10/2014 Initial Diagnosis Pancreatic cancer metastasized to liver   03/22/2014 -  Chemotherapy Abraxane/Gemzar days 1, 8, every 28 days.  Day 15 was cancelled due to leukopenia and thrombocytopenia on day 15 cycle 1.   05/24/2014 Treatment Plan Change Day 8 of cycle 3 is held with ANC of 1.1   06/05/2014 Imaging CT C/A/P . Interval decrease in size of the pancreatic tail mass.Improved hepatic metastatic disease. No new lesions. No CT findings for metastatic disease involving the chest.   08/09/2014 Tumor Marker CA 19-9= 33 (WNL)   10/26/2014 Imaging MRI- Continued interval decrease in size of the hepatic metastatic lesions and no new lesions are identified. Continued decrease in size of the pancreatic tail lesion.   01/03/2015 Tumor Marker Results for NEVAYAH, FAUST (MRN [id number]) as of 01/18/2015 12:52  01/03/2015 10:00 CA 19-9: 14    01/17/2015 Imaging MRI- Response to therapy of hepatic metastasis.  Similar size of a pancreatic tail lesion.   03/20/2015 Imaging MRI-L spine- Severe disc space narrowing at L2-L3, with endplate reactive changes. Large disc extrusion into the ventral epidural space,central to the RIGHT with a cephalad migrated free fragment. Additional Large disc extrusion into the retroperitone...   03/22/2015 Imaging CT pelvis- No evidence for metastatic disease within the pelvis.    CURRENT THERAPY: Gemzar/Abraxane  INTERVAL HISTORY: [name] 82 y.o. female returns for follow-up of her pancreatic cancer. She is doing fairly well. She notes that she attends church  weekly.  [name] is accompanied by her son and here for here for cycle #18 Gemzar/Abraxane. I personally reviewed and went over laboratory studies with the patient.  Reports that her back hurts when she gets up in the morning. This back pain began about one week ago. It is different from her previous back pain and she does not believe it is coming from a pinched nerve. She manages this pain with water and epsom salt. Denies dysuria.   She is eating okay. Her hands and feet are good. She has received shots for the arthritis in her knees, which has improved previous joint pain. She continues to walk to her sister's house. Denies falling, even without using a cane or walker.   She continues to experience dry eyes, managed with restasis eye drops. She has seen an eye doctor on Monday who has also recommended an OTC eye drop to use four times daily.   She had imaging ordered for re-staging but she missed her appointment.   Denies chest pain or shortness of breath. Reports after chemotherapy she becomes constipated then has diarrhea. This diarrhea resolves with imodium.   [name] has not spoken much with her children about death and dying. Her son notes today that they do need to discuss this.  The patient states she does not want to be on a ventilator or in a situation that is not "going to make her better."    MEDICAL HISTORY: Past Medical History  Diagnosis Date  . Hypertension 20 years   . Chronic diarrhea   .  Anxiety   . Depression   . Kidney stone     hx/ crushed   . Hx of adenomatous colonic polyps     tubular adenomas, last found in 2008  . GERD (gastroesophageal reflux disease)   . Erosive esophagitis   . Schatzki's ring 08/26/10    Last dilated on EGD by Dr. Trevor Iha [facility], linear gastric erosions, BI hemigastrectomy  . Hyperplastic colon polyp 03/19/10    tcs by Dr. Gala Romney  . Pancreatic cancer ([place]) 02/2014  . Pancreatic cancer metastasized to liver ([place]) 03/10/2014  .  Opioid contract exists 04/18/2015    With Dr. Moshe Cipro    has Essential hypertension; GERD (gastroesophageal reflux disease); Anxiety; Primary generalized (osteo)arthritis; Back pain with radiation; IGT (impaired glucose tolerance); Anemia, iron deficiency; Elevated LFTs; Abnormal liver ultrasound; Pancreatic cancer metastasized to liver [facility]); Allergic cough; Allergic rhinitis; Cough; Heme positive stool; Knee pain, right; Opioid contract exists; Hx of adenomatous colonic polyps; and Chronic low back pain with sciatica on her problem list.     is allergic to iohexol; aciphex; amlodipine besylate-valsartan; esomeprazole magnesium; omeprazole; penicillins; and ciprofloxacin.  Ms. Bury does not currently have medications on file.  SURGICAL HISTORY: Past Surgical History  Procedure Laterality Date  . Stomach ulcer  50 years ago     had some of her stomach removed   . Bunion removal      from both feet   . Left shoulder surgery  2009    DR HARRISON  . Colonoscopy  03/19/2010    DR Gala Romney,, normal TI, pancolonic diverticula, random colon bx neg., hyperplastic polyps removed  . Esophagogastroduodenoscopy  11/22/2003    DR Gala Romney, erosive RE, Billroth I  . Esophagogastroduodenoscopy  08/26/10    Dr. Gala Romney- moderate severe ERE, Scahtzki ring s/p dilation, Billroth I, linear gastric erosions, bx-gastric xanthelasma  . Breast lumpectomy Left   . Orif ankle fracture Right 05/22/2013    Procedure: OPEN REDUCTION INTERNAL FIXATION (ORIF) RIGHT ANKLE FRACTURE;  Surgeon: Sanjuana Kava, MD;  Location: [facility];  Service: Orthopedics;  Laterality: Right;  . Cholecystectomy  1965     SOCIAL HISTORY: Social History   Social History  . Marital Status: Widowed    Spouse Name: N/A  . Number of Children: 2  . Years of Education: N/A   Occupational History  . retired from Teacher, adult education    Social History Main Topics  . Smoking status: Former Smoker    Types: Cigarettes    Quit date: 11/09/2011  . Smokeless  tobacco: Never Used     Comment: quit a few weeks ago   . Alcohol Use: No  . Drug Use: No  . Sexual Activity: No   Other Topics Concern  . Not on file   Social History Narrative    FAMILY HISTORY: Family History  Problem Relation Age of Onset  . Hypertension Mother   . Kidney failure Brother     X1 ON DIALYSIS  . Diabetes Sister   . Cancer Sister     X2  . Hypertension Father   . Liver disease Neg Hx   . Colon cancer Neg Hx     Review of Systems  Constitutional: Negative for malaise/fatigue. HENT: Negative.   Eyes: Positive for dry eyes. Negative for vision change, double vision, photophobia, pain, discharge and redness. Dry eyes managed with eye drops.  Respiratory: Negative.   Cardiovascular: Positive for swelling. Intermittent foot swelling.   Gastrointestinal: Negative.   Genitourinary: Negative.  Musculoskeletal: Positive for joint  pain and back pain.  Skin: Negative.   Neurological: Negative for weakness, dizziness, tingling, tremors, sensory change, speech change, focal weakness, seizures and loss of consciousness. Endo/Heme/Allergies: Negative.   Psychiatric/Behavioral: Negative.   14 point review of systems was performed and is negative except as detailed under history of present illness and above   PHYSICAL EXAMINATION  ECOG PERFORMANCE STATUS: 0 - Asymptomatic  Filed Vitals:   07/11/15 0906  BP: 154/63  Pulse: 56  Temp: 98.2 F (36.8 C)  Resp: 18    Physical Exam  Constitutional: She is oriented to person, place, and time and well-developed, well-nourished, and in no distress.  Well groomed. HENT:  Head: Normocephalic and atraumatic.  Nose: Nose normal.  Mouth/Throat: Oropharynx is clear and moist. No oropharyngeal exudate.  Eyes: Conjunctivae and EOM are normal. Pupils are equal, round, and reactive to light. Right eye exhibits no discharge. Left eye exhibits no discharge. No scleral icterus.  Neck: Normal range of motion. Neck supple. No  tracheal deviation present. No thyromegaly present.  Cardiovascular: Normal rate, regular rhythm and normal heart sounds.  Exam reveals no gallop and no friction rub.   No murmur heard. Pulmonary/Chest: Effort normal and breath sounds normal. She has no wheezes. She has no rales.  Abdominal: Soft. Bowel sounds are normal. She exhibits no distension and no mass. There is no tenderness. There is no rebound and no guarding.  Musculoskeletal: Normal range of motion. She exhibits no edema.  Lymphadenopathy:    She has no cervical adenopathy.  Neurological: She is alert and oriented to person, place, and time. She has normal reflexes. No cranial nerve deficit. Gait normal. Coordination normal.  Skin: Skin is warm and dry. No rash noted.  Psychiatric: Mood, memory, affect and judgment normal.  Nursing note and vitals reviewed.   LABORATORY DATA: I have reviewed the data as listed. Results for [name], [name] (MRN [id number]) as of 07/11/2015 10:00  Ref. Range 07/11/2015 09:15  Sodium Latest Ref Range: 135-145 mmol/L 141  Potassium Latest Ref Range: 3.5-5.1 mmol/L 4.2  Chloride Latest Ref Range: 101-111 mmol/L 106  CO2 Latest Ref Range: 22-32 mmol/L 27  BUN Latest Ref Range: 6-20 mg/dL 15  Creatinine Latest Ref Range: 0.44-1.00 mg/dL 0.95  Calcium Latest Ref Range: 8.9-10.3 mg/dL 8.6 (L)  EGFR (Non-African Amer.) Latest Ref Range: >60 mL/min 53 (L)  EGFR (African American) Latest Ref Range: >60 mL/min >60  Glucose Latest Ref Range: 65-99 mg/dL 126 (H)  Anion gap Latest Ref Range: 5-15  8  Alkaline Phosphatase Latest Ref Range: 38-126 U/L 83  Albumin Latest Ref Range: 3.5-5.0 g/dL 3.7  AST Latest Ref Range: 15-41 U/L 16  ALT Latest Ref Range: 14-54 U/L 12 (L)  Total Protein Latest Ref Range: 6.5-8.1 g/dL 7.0  Total Bilirubin Latest Ref Range: 0.3-1.2 mg/dL 0.5  WBC Latest Ref Range: 4.0-10.5 K/uL 6.7  RBC Latest Ref Range: 3.87-5.11 MIL/uL 3.53 (L)  Hemoglobin Latest Ref Range: 12.0-15.0 g/dL  10.0 (L)  HCT Latest Ref Range: 36.0-46.0 % 30.8 (L)  MCV Latest Ref Range: 78.0-100.0 fL 87.3  MCH Latest Ref Range: 26.0-34.0 pg 28.3  MCHC Latest Ref Range: 30.0-36.0 g/dL 32.5  RDW Latest Ref Range: 11.5-15.5 % 17.3 (H)  Platelets Latest Ref Range: 150-400 K/uL 259  Neutrophils Latest Units: % 78  Lymphocytes Latest Units: % 12  Monocytes Relative Latest Units: % 8  Eosinophil Latest Units: % 1  Basophil Latest Units: % 1  NEUT# Latest Ref Range: 1.7-7.7 K/uL 5.2  Lymphocyte # Latest Ref Range: 0.7-4.0 K/uL 0.8  Monocyte # Latest Ref Range: 0.1-1.0 K/uL 0.5  Eosinophils Absolute Latest Ref Range: 0.0-0.7 K/uL 0.1  Basophils Absolute Latest Ref Range: 0.0-0.1 K/uL 0.0    RADIOLOGY: I have personally reviewed the radiological images as listed and agreed with the findings in the report.  Study Result     CLINICAL DATA: Patient with history of stage IV pancreatic cancer diagnosed 2015. On chemotherapy. New right hip pain.  EXAM: CT PELVIS WITH CONTRAST  TECHNIQUE: Multidetector CT imaging of the pelvis was performed using the standard protocol following the bolus administration of intravenous contrast.  CONTRAST: 170m OMNIPAQUE IOHEXOL 300 MG/ML SOLN  COMPARISON: CT abdomen pelvis 07/19/2014  FINDINGS: Visualized distal abdominal aorta is normal in caliber with peripheral calcified atherosclerotic plaque. Uterus and adnexal structures are unremarkable. Trace free fluid in the pelvis. Urinary bladder is unremarkable. The visualized large and small bowel is unremarkable. No retroperitoneal or pelvic lymphadenopathy. Lumbar spine degenerative changes. No aggressive or acute appearing osseous lesions. L4-5 posterior disc osteophyte complex.  IMPRESSION: No evidence for metastatic disease within the pelvis.   Electronically Signed  By: DLovey NewcomerM.D.  On: 03/22/2015 15:08     ASSESSMENT and THERAPY PLAN:   STAGE IV Pancreatic Cancer Ongoing  response to therapy Mild chemotherapy induced neuropathy Anemia Hemoccult positive stool  82year old female on Gemzar/Abraxane for stage IV pancreatic cancer. She is doing and has done remarkably well.  She would like to continue with therapy. Her performance status remains a 1. It is time for repeat imaging and she agrees. She had imaging arranged but missed it. We have reordered studies.  We will plan on moving forward with therapy.  We will arrange for repeat staging studies and see her back post.   The patient is here for cycle #18 Gemzar/Abraxane.  She has a screening mammogram scheduled for 08/06/2015.  I have ordered a urinalysis to evaluate recent onset back pain.   Encouraged [name] to speak with her family about death and dying, especially end of life decisions. Also encouraged the patient to create a living will. She and her son have agreed to do so. She remains a full code.   She will return next week for day 8 of chemotherapy.  All questions were answered. The patient knows to call the clinic with any problems, questions or concerns. We can certainly see the patient much sooner if necessary.  This document serves as a record of services personally performed by SAncil Linsey MD. It was created on her behalf by EArlyce Harman a trained medical scribe. The creation of this record is based on the scribe's personal observations and the provider's statements to them. This document has been checked and approved by the attending provider.  I have reviewed the above documentation for accuracy and completeness, and I agree with the above.  This note was signed electronically. PMolli Hazard MD  07/11/2015

## 2015-07-11 NOTE — Patient Instructions (Signed)
[facility] Discharge Instructions for Patients Receiving Chemotherapy   Beginning January 23rd 2017 lab work for the [facility] will be done in the  Main lab at [facility] on 1st floor. If you have a lab appointment with the [place] please come in thru the  Main Entrance and check in at the main information desk   Today you received the following chemotherapy agents: Abraxane and Gemzar.     If you develop nausea and vomiting, or diarrhea that is not controlled by your medication, call the clinic.  The clinic phone number is [phone]. Office hours are Monday-Friday 8:30am-5:00pm.  BELOW ARE SYMPTOMS THAT SHOULD BE REPORTED IMMEDIATELY:  *FEVER GREATER THAN 101.0 F  *CHILLS WITH OR WITHOUT FEVER  NAUSEA AND VOMITING THAT IS NOT CONTROLLED WITH YOUR NAUSEA MEDICATION  *UNUSUAL SHORTNESS OF BREATH  *UNUSUAL BRUISING OR BLEEDING  TENDERNESS IN MOUTH AND THROAT WITH OR WITHOUT PRESENCE OF ULCERS  *URINARY PROBLEMS  *BOWEL PROBLEMS  UNUSUAL RASH Items with * indicate a potential emergency and should be followed up as soon as possible. If you have an emergency after office hours please contact your primary care physician or go to the nearest emergency department.  Please call the clinic during office hours if you have any questions or concerns.   You may also contact the Patient Navigator at [phone] should you have any questions or need assistance in obtaining follow up care.

## 2015-07-12 LAB — CANCER ANTIGEN 19-9: CA 19 9: 13 U/mL (ref 0–35)

## 2015-07-13 ENCOUNTER — Other Ambulatory Visit (HOSPITAL_COMMUNITY): Payer: Self-pay | Admitting: *Deleted

## 2015-07-13 DIAGNOSIS — C259 Malignant neoplasm of pancreas, unspecified: Secondary | ICD-10-CM

## 2015-07-13 DIAGNOSIS — C787 Secondary malignant neoplasm of liver and intrahepatic bile duct: Principal | ICD-10-CM

## 2015-07-13 MED ORDER — SULFAMETHOXAZOLE-TRIMETHOPRIM 800-160 MG PO TABS
1.0000 | ORAL_TABLET | Freq: Two times a day (BID) | ORAL | Status: DC
Start: 2015-07-13 — End: 2015-08-15

## 2015-07-15 ENCOUNTER — Other Ambulatory Visit: Payer: Self-pay | Admitting: Family Medicine

## 2015-07-18 ENCOUNTER — Encounter (HOSPITAL_BASED_OUTPATIENT_CLINIC_OR_DEPARTMENT_OTHER): Payer: Medicare Other

## 2015-07-18 VITALS — BP 170/69 | HR 64 | Temp 97.9°F | Resp 16 | Wt 147.6 lb

## 2015-07-18 DIAGNOSIS — C787 Secondary malignant neoplasm of liver and intrahepatic bile duct: Secondary | ICD-10-CM | POA: Diagnosis not present

## 2015-07-18 DIAGNOSIS — C801 Malignant (primary) neoplasm, unspecified: Secondary | ICD-10-CM | POA: Diagnosis not present

## 2015-07-18 DIAGNOSIS — C259 Malignant neoplasm of pancreas, unspecified: Secondary | ICD-10-CM

## 2015-07-18 DIAGNOSIS — Z95828 Presence of other vascular implants and grafts: Secondary | ICD-10-CM

## 2015-07-18 DIAGNOSIS — C252 Malignant neoplasm of tail of pancreas: Secondary | ICD-10-CM | POA: Diagnosis not present

## 2015-07-18 DIAGNOSIS — Z5111 Encounter for antineoplastic chemotherapy: Secondary | ICD-10-CM | POA: Diagnosis present

## 2015-07-18 LAB — COMPREHENSIVE METABOLIC PANEL
ALT: 14 U/L (ref 14–54)
AST: 17 U/L (ref 15–41)
Albumin: 3.9 g/dL (ref 3.5–5.0)
Alkaline Phosphatase: 88 U/L (ref 38–126)
Anion gap: 6 (ref 5–15)
BUN: 19 mg/dL (ref 6–20)
CHLORIDE: 109 mmol/L (ref 101–111)
CO2: 22 mmol/L (ref 22–32)
CREATININE: 0.97 mg/dL (ref 0.44–1.00)
Calcium: 8.6 mg/dL — ABNORMAL LOW (ref 8.9–10.3)
GFR calc non Af Amer: 52 mL/min — ABNORMAL LOW (ref >60)
Glucose, Bld: 138 mg/dL — ABNORMAL HIGH (ref 65–99)
Potassium: 4.5 mmol/L (ref 3.5–5.1)
SODIUM: 137 mmol/L (ref 135–145)
Total Bilirubin: 0.5 mg/dL (ref 0.3–1.2)
Total Protein: 7 g/dL (ref 6.5–8.1)

## 2015-07-18 LAB — CBC WITH DIFFERENTIAL/PLATELET
BASOS PCT: 0 %
Basophils Absolute: 0 10*3/uL (ref 0.0–0.1)
EOS PCT: 0 %
Eosinophils Absolute: 0 10*3/uL (ref 0.0–0.7)
HEMATOCRIT: 29 % — AB (ref 36.0–46.0)
Hemoglobin: 9.5 g/dL — ABNORMAL LOW (ref 12.0–15.0)
LYMPHS PCT: 36 %
Lymphs Abs: 1.1 10*3/uL (ref 0.7–4.0)
MCH: 28.5 pg (ref 26.0–34.0)
MCHC: 32.8 g/dL (ref 30.0–36.0)
MCV: 87.1 fL (ref 78.0–100.0)
MONO ABS: 0.1 10*3/uL (ref 0.1–1.0)
MONOS PCT: 4 %
NEUTROS ABS: 1.8 10*3/uL (ref 1.7–7.7)
Neutrophils Relative %: 60 %
Platelets: 120 10*3/uL — ABNORMAL LOW (ref 150–400)
RBC: 3.33 MIL/uL — ABNORMAL LOW (ref 3.87–5.11)
RDW: 17 % — AB (ref 11.5–15.5)
WBC: 3 10*3/uL — ABNORMAL LOW (ref 4.0–10.5)

## 2015-07-18 MED ORDER — SODIUM CHLORIDE 0.9 % IV SOLN
Freq: Once | INTRAVENOUS | Status: AC
  Administered 2015-07-18: 13:00:00 via INTRAVENOUS

## 2015-07-18 MED ORDER — SODIUM CHLORIDE 0.9 % IV SOLN
1000.0000 mg/m2 | Freq: Once | INTRAVENOUS | Status: AC
  Administered 2015-07-18: 1786 mg via INTRAVENOUS
  Filled 2015-07-18: qty 46.97

## 2015-07-18 MED ORDER — DEXAMETHASONE SODIUM PHOSPHATE 10 MG/ML IJ SOLN: 10.0000 mg | Freq: Once | INTRAMUSCULAR | Status: DC

## 2015-07-18 MED ORDER — PACLITAXEL PROTEIN-BOUND CHEMO INJECTION 100 MG
125.0000 mg/m2 | Freq: Once | INTRAVENOUS | Status: AC
  Administered 2015-07-18: 225 mg via INTRAVENOUS
  Filled 2015-07-18: qty 45

## 2015-07-18 MED ORDER — SODIUM CHLORIDE 0.9 % IJ SOLN
10.0000 mL | INTRAMUSCULAR | Status: DC | PRN
  Administered 2015-07-18: 10 mL
  Filled 2015-07-18: qty 10

## 2015-07-18 MED ORDER — HEPARIN SOD (PORK) LOCK FLUSH 100 UNIT/ML IV SOLN
500.0000 [IU] | Freq: Once | INTRAVENOUS | Status: AC
  Administered 2015-07-18: 500 [IU] via INTRAVENOUS

## 2015-07-18 MED ORDER — SODIUM CHLORIDE 0.9 % IV SOLN: 8.0000 mg | Freq: Once | INTRAVENOUS | Status: DC

## 2015-07-18 MED ORDER — HEPARIN SOD (PORK) LOCK FLUSH 100 UNIT/ML IV SOLN: 500.0000 [IU] | Freq: Once | INTRAVENOUS | Status: AC | PRN

## 2015-07-18 MED ORDER — SODIUM CHLORIDE 0.9 % IV SOLN
Freq: Once | INTRAVENOUS | Status: AC
Start: 2015-07-18 — End: 2015-07-18
  Administered 2015-07-18: 8 mg via INTRAVENOUS
  Filled 2015-07-18: qty 4

## 2015-07-18 NOTE — Progress Notes (Signed)
Tolerated chemo well. Ambulatory on discharge home with family. 

## 2015-07-18 NOTE — Patient Instructions (Signed)
[facility] Discharge Instructions for Patients Receiving Chemotherapy   Beginning January 23rd 2017 lab work for the [facility] will be done in the  Main lab at [facility] on 1st floor. If you have a lab appointment with the [place] please come in thru the  Main Entrance and check in at the main information desk   Today you received the following chemotherapy agents Abraxane and Gemzar.  To help prevent nausea and vomiting after your treatment, we encourage you to take your nausea medication as instructed. If you develop nausea and vomiting, or diarrhea that is not controlled by your medication, call the clinic.  The clinic phone number is [phone]. Office hours are Monday-Friday 8:30am-5:00pm.  BELOW ARE SYMPTOMS THAT SHOULD BE REPORTED IMMEDIATELY:  *FEVER GREATER THAN 101.0 F  *CHILLS WITH OR WITHOUT FEVER  NAUSEA AND VOMITING THAT IS NOT CONTROLLED WITH YOUR NAUSEA MEDICATION  *UNUSUAL SHORTNESS OF BREATH  *UNUSUAL BRUISING OR BLEEDING  TENDERNESS IN MOUTH AND THROAT WITH OR WITHOUT PRESENCE OF ULCERS  *URINARY PROBLEMS  *BOWEL PROBLEMS  UNUSUAL RASH Items with * indicate a potential emergency and should be followed up as soon as possible. If you have an emergency after office hours please contact your primary care physician or go to the nearest emergency department.  Please call the clinic during office hours if you have any questions or concerns.   You may also contact the Patient Navigator at [phone] should you have any questions or need assistance in obtaining follow up care.   Resources For Cancer Patients and their Caregivers ? American Cancer Society: Can assist with transportation, wigs, general needs, runs Look Good Feel Better.        831-067-8818 ? Cancer Care: Provides financial assistance, online support groups, medication/co-pay assistance.  1-800-813-HOPE 912 691 5892) ? [place] Assists  [place] cancer patients and their families through emotional , educational and financial support.  (236) 365-9843 ? [place] DSS Where to apply for food stamps, Medicaid and utility assistance. (628)048-6166 ? RCATS: Transportation to medical appointments. 580-094-4677 ? Social Security Administration: May apply for disability if have a Stage IV cancer. 8640816924 912-029-6623 ? [employer], Disability and Transit Services: Assists with nutrition, care and transit needs. [phone]

## 2015-07-24 ENCOUNTER — Other Ambulatory Visit (HOSPITAL_COMMUNITY): Payer: Self-pay | Admitting: Oncology

## 2015-07-24 ENCOUNTER — Other Ambulatory Visit (HOSPITAL_COMMUNITY): Payer: Self-pay | Admitting: Emergency Medicine

## 2015-07-24 ENCOUNTER — Other Ambulatory Visit: Payer: Self-pay

## 2015-07-24 ENCOUNTER — Ambulatory Visit (HOSPITAL_COMMUNITY)
Admission: RE | Admit: 2015-07-24 | Discharge: 2015-07-24 | Disposition: A | Discharge status: Home / Self Care | Payer: Medicare Other | Source: Ambulatory Visit | Attending: Oncology | Admitting: Oncology

## 2015-07-24 DIAGNOSIS — C787 Secondary malignant neoplasm of liver and intrahepatic bile duct: Secondary | ICD-10-CM | POA: Diagnosis not present

## 2015-07-24 DIAGNOSIS — C259 Malignant neoplasm of pancreas, unspecified: Secondary | ICD-10-CM | POA: Diagnosis not present

## 2015-07-24 DIAGNOSIS — R935 Abnormal findings on diagnostic imaging of other abdominal regions, including retroperitoneum: Secondary | ICD-10-CM | POA: Diagnosis not present

## 2015-07-24 DIAGNOSIS — C252 Malignant neoplasm of tail of pancreas: Secondary | ICD-10-CM | POA: Diagnosis not present

## 2015-07-24 MED ORDER — PREDNISONE 5 MG PO TABS: 5.0000 mg | ORAL_TABLET | Freq: Every day | ORAL | Status: DC

## 2015-07-24 MED ORDER — HYDROCODONE-ACETAMINOPHEN 5-325 MG PO TABS: ORAL_TABLET | ORAL | Status: DC

## 2015-07-24 MED ORDER — GADOBENATE DIMEGLUMINE 529 MG/ML IV SOLN
14.0000 mL | Freq: Once | INTRAVENOUS | Status: AC | PRN
  Administered 2015-07-24: 14 mL via INTRAVENOUS

## 2015-07-25 ENCOUNTER — Encounter (HOSPITAL_BASED_OUTPATIENT_CLINIC_OR_DEPARTMENT_OTHER): Payer: Medicare Other | Admitting: Hematology & Oncology

## 2015-07-25 ENCOUNTER — Encounter (HOSPITAL_COMMUNITY): Payer: Self-pay | Admitting: Hematology & Oncology

## 2015-07-25 VITALS — BP 113/95 | HR 66 | Temp 98.0°F | Resp 18 | Wt 149.5 lb

## 2015-07-25 DIAGNOSIS — D6959 Other secondary thrombocytopenia: Secondary | ICD-10-CM | POA: Diagnosis not present

## 2015-07-25 DIAGNOSIS — R195 Other fecal abnormalities: Secondary | ICD-10-CM

## 2015-07-25 DIAGNOSIS — D696 Thrombocytopenia, unspecified: Secondary | ICD-10-CM

## 2015-07-25 DIAGNOSIS — T451X5A Adverse effect of antineoplastic and immunosuppressive drugs, initial encounter: Secondary | ICD-10-CM

## 2015-07-25 DIAGNOSIS — C787 Secondary malignant neoplasm of liver and intrahepatic bile duct: Secondary | ICD-10-CM

## 2015-07-25 DIAGNOSIS — D649 Anemia, unspecified: Secondary | ICD-10-CM | POA: Diagnosis not present

## 2015-07-25 DIAGNOSIS — C259 Malignant neoplasm of pancreas, unspecified: Secondary | ICD-10-CM

## 2015-07-25 DIAGNOSIS — D6481 Anemia due to antineoplastic chemotherapy: Secondary | ICD-10-CM

## 2015-07-25 DIAGNOSIS — G620 Drug-induced polyneuropathy: Secondary | ICD-10-CM

## 2015-07-25 NOTE — Progress Notes (Signed)
[name] Nakayama, MD [address], [address] [place] [address]   DIAGNOSIS: Pancreatic cancer metastasized to liver   Staging form: Pancreas, AJCC 7th Edition     Clinical: Stage IV (T3, N1, M1) - Signed by Baird Cancer, PA-C on 03/16/2014     Pathologic: No stage assigned - Unsigned  CONTRAST MEDIA ALLERGY  SUMMARY OF ONCOLOGIC HISTORY:   Pancreatic cancer metastasized to liver ([place])   03/10/2014 Initial Diagnosis Pancreatic cancer metastasized to liver   03/22/2014 -  Chemotherapy Abraxane/Gemzar days 1, 8, every 28 days.  Day 15 was cancelled due to leukopenia and thrombocytopenia on day 15 cycle 1.   05/24/2014 Treatment Plan Change Day 8 of cycle 3 is held with ANC of 1.1   06/05/2014 Imaging CT C/A/P . Interval decrease in size of the pancreatic tail mass.Improved hepatic metastatic disease. No new lesions. No CT findings for metastatic disease involving the chest.   08/09/2014 Tumor Marker CA 19-9= 33 (WNL)   10/26/2014 Imaging MRI- Continued interval decrease in size of the hepatic metastatic lesions and no new lesions are identified. Continued decrease in size of the pancreatic tail lesion.   01/03/2015 Tumor Marker Results for [name] (MRN [id number]) as of 01/18/2015 12:52  01/03/2015 10:00 CA 19-9: 14    01/17/2015 Imaging MRI- Response to therapy of hepatic metastasis.  Similar size of a pancreatic tail lesion.   03/20/2015 Imaging MRI-L spine- Severe disc space narrowing at L2-L3, with endplate reactive changes. Large disc extrusion into the ventral epidural space,central to the RIGHT with a cephalad migrated free fragment. Additional Large disc extrusion into the retroperitone...   03/22/2015 Imaging CT pelvis- No evidence for metastatic disease within the pelvis.   07/24/2015 Imaging MRI abd- Continued response to therapy, with no residual detectable liver metastases. No new sites of metastatic disease in the abdomen.     CURRENT THERAPY:  Gemzar/Abraxane  INTERVAL HISTORY: [name] 82 y.o. female returns for follow-up of her pancreatic cancer. She is doing fairly well. She notes that she attends church weekly.  [name] is accompanied by a female friend. I personally reviewed and went over recent MRI imaging results with the patient.  States she was more tired after the MRI scans than after receiving chemo. She feels as though she is doing physically well with treatment.  Her friend notes the patient has a constant runny nose. [name] also reports her left ear has begun to hurt and wonders if this is cancer-related. Notes she has ear drops and has previously been to an ENT to rinse her ear out.  She continues to go out occasionally with her "boyfriend" She denies worsening neuropathy. She notes her appetite is still quite good. She denies abdominal pain, diarrhea, nausea or vomiting.    MEDICAL HISTORY: Past Medical History  Diagnosis Date  . Hypertension 20 years   . Chronic diarrhea   . Anxiety   . Depression   . Kidney stone     hx/ crushed   . Hx of adenomatous colonic polyps     tubular adenomas, last found in 2008  . GERD (gastroesophageal reflux disease)   . Erosive esophagitis   . Schatzki's ring 08/26/10    Last dilated on EGD by Dr. Trevor I[facility], linear gastric erosions, BI hemigastrectomy  . Hyperplastic colon polyp 03/19/10    tcs by Dr. Gala Romney  . Pancreatic cancer ([place]) 02/2014  . Pancreatic cancer metastasized to liver ([place]) 03/10/2014  .  Opioid contract exists 04/18/2015    With Dr. Moshe Cipro    has Essential hypertension; GERD (gastroesophageal reflux disease); Anxiety; Primary generalized (osteo)arthritis; Back pain with radiation; IGT (impaired glucose tolerance); Anemia, iron deficiency; Elevated LFTs; Abnormal liver ultrasound; Pancreatic cancer metastasized to liver [facility]); Allergic cough; Allergic rhinitis; Cough; Heme positive stool; Knee pain, right; Opioid contract exists; Hx of  adenomatous colonic polyps; and Chronic low back pain with sciatica on her problem list.     is allergic to iohexol; aciphex; amlodipine besylate-valsartan; esomeprazole magnesium; omeprazole; penicillins; and ciprofloxacin.  [name] does not currently have medications on file.  SURGICAL HISTORY: Past Surgical History  Procedure Laterality Date  . Stomach ulcer  50 years ago     had some of her stomach removed   . Bunion removal      from both feet   . Left shoulder surgery  2009    DR HARRISON  . Colonoscopy  03/19/2010    DR Gala Romney,, normal TI, pancolonic diverticula, random colon bx neg., hyperplastic polyps removed  . Esophagogastroduodenoscopy  11/22/2003    DR Gala Romney, erosive RE, Billroth I  . Esophagogastroduodenoscopy  08/26/10    Dr. Gala Romney- moderate severe ERE, Scahtzki ring s/p dilation, Billroth I, linear gastric erosions, bx-gastric xanthelasma  . Breast lumpectomy Left   . Orif ankle fracture Right 05/22/2013    Procedure: OPEN REDUCTION INTERNAL FIXATION (ORIF) RIGHT ANKLE FRACTURE;  Surgeon: Sanjuana Kava, MD;  Location: [facility];  Service: Orthopedics;  Laterality: Right;  . Cholecystectomy  1965     SOCIAL HISTORY: Social History   Social History  . Marital Status: Widowed    Spouse Name: N/A  . Number of Children: 2  . Years of Education: N/A   Occupational History  . retired from Teacher, adult education    Social History Main Topics  . Smoking status: Former Smoker    Types: Cigarettes    Quit date: 11/09/2011  . Smokeless tobacco: Never Used     Comment: quit a few weeks ago   . Alcohol Use: No  . Drug Use: No  . Sexual Activity: No   Other Topics Concern  . Not on file   Social History Narrative    FAMILY HISTORY: Family History  Problem Relation Age of Onset  . Hypertension Mother   . Kidney failure Brother     X1 ON DIALYSIS  . Diabetes Sister   . Cancer Sister     X2  . Hypertension Father   . Liver disease Neg Hx   . Colon cancer Neg Hx      Review of Systems  Constitutional: Negative for malaise/fatigue. HENT: Positive for runny nose. Eyes: Positive for dry eyes. Negative for vision change, double vision, photophobia, pain, discharge and redness. Dry eyes managed with eye drops.  Respiratory: Negative.   Cardiovascular: Positive for swelling. Intermittent foot swelling.   Gastrointestinal: Negative.   Genitourinary: Negative.  Musculoskeletal: Positive for joint pain and back pain.  Skin: Negative.   Neurological: Negative for weakness, dizziness, tingling, tremors, sensory change, speech change, focal weakness, seizures and loss of consciousness. Endo/Heme/Allergies: Negative.   Psychiatric/Behavioral: Negative.   14 point review of systems was performed and is negative except as detailed under history of present illness and above   PHYSICAL EXAMINATION  ECOG PERFORMANCE STATUS: 0 - Asymptomatic  Filed Vitals:   07/25/15 0822  BP: 113/95  Pulse: 66  Temp: 98 F (36.7 C)  Resp: 18    Physical Exam  Constitutional: She  is oriented to person, place, and time and well-developed, well-nourished, and in no distress.  Well groomed. HENT: Cerumen impaction in the left ear, unable to view ear drum. Head: Normocephalic and atraumatic.  Nose: Nose normal.  Mouth/Throat: Oropharynx is clear and moist. No oropharyngeal exudate.  Eyes: Conjunctivae and EOM are normal. Pupils are equal, round, and reactive to light. Right eye exhibits no discharge. Left eye exhibits no discharge. No scleral icterus.  Neck: Normal range of motion. Neck supple. No tracheal deviation present. No thyromegaly present.  Cardiovascular: Normal rate, regular rhythm and normal heart sounds.  Exam reveals no gallop and no friction rub.   No murmur heard. Pulmonary/Chest: Effort normal and breath sounds normal. She has no wheezes. She has no rales.  Abdominal: Soft. Bowel sounds are normal. She exhibits no distension and no mass. There is no  tenderness. There is no rebound and no guarding.  Musculoskeletal: Normal range of motion. She exhibits no edema.  Lymphadenopathy:    She has no cervical adenopathy.  Neurological: She is alert and oriented to person, place, and time. She has normal reflexes. No cranial nerve deficit. Gait normal. Coordination normal.  Skin: Skin is warm and dry. No rash noted.  Psychiatric: Mood, memory, affect and judgment normal.  Nursing note and vitals reviewed.   LABORATORY DATA: I have reviewed the data as listed. Results for [name], [name] (MRN [id number]) as of 07/25/2015 08:46  Ref. Range 07/18/2015 11:13  Sodium Latest Ref Range: 135-145 mmol/L 137  Potassium Latest Ref Range: 3.5-5.1 mmol/L 4.5  Chloride Latest Ref Range: 101-111 mmol/L 109  CO2 Latest Ref Range: 22-32 mmol/L 22  BUN Latest Ref Range: 6-20 mg/dL 19  Creatinine Latest Ref Range: 0.44-1.00 mg/dL 0.97  Calcium Latest Ref Range: 8.9-10.3 mg/dL 8.6 (L)  EGFR (Non-African Amer.) Latest Ref Range: >60 mL/min 52 (L)  EGFR (African American) Latest Ref Range: >60 mL/min >60  Glucose Latest Ref Range: 65-99 mg/dL 138 (H)  Anion gap Latest Ref Range: 5-15  6  Alkaline Phosphatase Latest Ref Range: 38-126 U/L 88  Albumin Latest Ref Range: 3.5-5.0 g/dL 3.9  AST Latest Ref Range: 15-41 U/L 17  ALT Latest Ref Range: 14-54 U/L 14  Total Protein Latest Ref Range: 6.5-8.1 g/dL 7.0  Total Bilirubin Latest Ref Range: 0.3-1.2 mg/dL 0.5  WBC Latest Ref Range: 4.0-10.5 K/uL 3.0 (L)  RBC Latest Ref Range: 3.87-5.11 MIL/uL 3.33 (L)  Hemoglobin Latest Ref Range: 12.0-15.0 g/dL 9.5 (L)  HCT Latest Ref Range: 36.0-46.0 % 29.0 (L)  MCV Latest Ref Range: 78.0-100.0 fL 87.1  MCH Latest Ref Range: 26.0-34.0 pg 28.5  MCHC Latest Ref Range: 30.0-36.0 g/dL 32.8  RDW Latest Ref Range: 11.5-15.5 % 17.0 (H)  Platelets Latest Ref Range: 150-400 K/uL 120 (L)  Neutrophils Latest Units: % 60  Lymphocytes Latest Units: % 36  Monocytes Relative Latest  Units: % 4  Eosinophil Latest Units: % 0  Basophil Latest Units: % 0  NEUT# Latest Ref Range: 1.7-7.7 K/uL 1.8  Lymphocyte # Latest Ref Range: 0.7-4.0 K/uL 1.1  Monocyte # Latest Ref Range: 0.1-1.0 K/uL 0.1  Eosinophils Absolute Latest Ref Range: 0.0-0.7 K/uL 0.0  Basophils Absolute Latest Ref Range: 0.0-0.1 K/uL 0.0   Results for [name], [name] (MRN [id number]) as of 07/25/2015 20:53  Ref. Range 03/21/2015 10:08 04/18/2015 08:43 05/16/2015 10:00 06/13/2015 09:29 07/11/2015 09:15  CA 19-9 Latest Ref Range: 0-35 U/mL '19 16 14 17 13     ' RADIOLOGY: I have personally reviewed the radiological images  as listed and agreed with the findings in the report.  Study Result     CLINICAL DATA: Stage IV pancreatic tail cancer presenting for restaging status post therapy.  EXAM: MRI ABDOMEN WITHOUT AND WITH CONTRAST  TECHNIQUE: Multiplanar multisequence MR imaging of the abdomen was performed both before and after the administration of intravenous contrast.  CONTRAST: 45m MULTIHANCE GADOBENATE DIMEGLUMINE 529 MG/ML IV SOLN  COMPARISON: 01/17/2015 MRI abdomen.  FINDINGS: Motion degraded study.  Lower chest: Top-normal heart size.  Hepatobiliary: Normal liver size and configuration. No hepatic steatosis. No residual liver masses. The previously described 0.6 cm subcapsular T2 hyperintense segment 8 right liver lobe lesion has resolved. The previously described 0.9 cm segment 7 right liver lobe lesion has resolved. Mild heterogeneity within the periphery of the right liver lobe on the arterial phase sequence is not appreciably changed and is occult on all other sequences, in keeping with transient perfusional phenomena. Cholecystectomy. No intrahepatic biliary ductal dilatation. Common bile duct diameter 8 mm, within expected post cholecystectomy limits. No evidence of choledocholithiasis.  Pancreas: There is a heterogeneously enhancing and heterogeneous T2 signal 2.0 x 1.4 cm  pancreatic tail mass (series 11/ image 27), previously 2.2 x 1.7 cm on 01/17/2015 using similar measurement technique, mildly decreased in size. There is stable top-normal caliber of the main pancreatic duct. No new pancreatic masses. No pancreas divisum.  Spleen: Normal size. No mass.  Adrenals/Urinary Tract: Normal adrenals. No hydronephrosis. There are several small renal cysts in both kidneys that demonstrate precontrast T1 hyperintensity, without obvious enhancement on the limited motion degraded postcontrast images, largest 2.1 cm in the posterior right kidney, not definitely changed, favor Bosniak category 2 hemorrhagic/ proteinaceous renal cysts. There are numerous additional small simple appearing renal cysts in both kidneys.  Stomach/Bowel: Grossly normal stomach. Visualized small and large bowel is normal caliber, with no bowel wall thickening. Scattered diverticula are seen throughout the visualized colon.  Vascular/Lymphatic: Atherosclerotic nonaneurysmal abdominal aorta. Patent portal, splenic, hepatic and renal veins. No pathologically enlarged lymph nodes in the abdomen.  Other: No abdominal ascites or focal fluid collection.  Musculoskeletal: No aggressive appearing focal osseous lesions.  IMPRESSION: 1. Significantly motion degraded study. Consider future imaging follow-up with CT given the patient's limited ability to breath hold. 2. Continued response to therapy, with no residual detectable liver metastases. No new sites of metastatic disease in the abdomen. 3. Mildly decreased size of the primary pancreatic tail malignancy. 4. Probably benign Bosniak category 1 and category 2 renal cysts in both kidneys, difficult to characterize given motion degradation.   Electronically Signed  By: JIlona SorrelM.D.  On: 07/24/2015 15:33    ASSESSMENT and THERAPY PLAN:   STAGE IV Pancreatic Cancer Ongoing response to therapy Mild chemotherapy induced  neuropathy Anemia Hemoccult positive stool Thrombocytopenia  82year old female on Gemzar/Abraxane for stage IV pancreatic cancer. She is doing and has done remarkably well.  She would like to continue with therapy. Her performance status remains a 1. We have discussed taking a break from treatment if she becomes too fatigued but she remains fairly active and feels her QOL is quite good.   I personally reviewed and went over recent MRI imaging results with the patient.  I have referred the patient to Dr. TBenjamine Molaof ENT for cerumen impaction.  She will return on 3/29 for her next chemotherapy treatment day. She will follow up the following week on 4/5.  We have discussed issues pertaining to DNR status, living will and  did so the last time her son was here. I have encouraged her to continue to address these important matters.  All questions were answered. The patient knows to call the clinic with any problems, questions or concerns. We can certainly see the patient much sooner if necessary.  This document serves as a record of services personally performed by Ancil Linsey, MD. It was created on her behalf by Arlyce Harman, a trained medical scribe. The creation of this record is based on the scribe's personal observations and the provider's statements to them. This document has been checked and approved by the attending provider.  I have reviewed the above documentation for accuracy and completeness, and I agree with the above.  This note was signed electronically. Molli Hazard, MD  07/25/2015

## 2015-07-25 NOTE — Patient Instructions (Addendum)
[place] at [facility] Discharge Instructions  RECOMMENDATIONS MADE BY THE CONSULTANT AND ANY TEST RESULTS WILL BE SENT TO YOUR REFERRING PHYSICIAN.   Exam and discussion by Dr Whitney Muse today No evidence of disease in the liver, the spot in the pancreas is a little bit smaller, scans are improved Cancer keeps responding to treatment, just worry that you dont get too fatigued   Referral to Dr Benjamine Mola, they will call you with that appt  Return to see the doctor on 4/5  Please call the clinic if you have any questions or concerns     Thank you for choosing [place] at [facility] to provide your oncology and hematology care.  To afford each patient quality time with our provider, please arrive at least 15 minutes before your scheduled appointment time.   Beginning January 23rd 2017 lab work for the [name] will be done in the  Main lab at [facility] on 1st floor. If you have a lab appointment with the [place] please come in thru the  Main Entrance and check in at the main information desk  You need to re-schedule your appointment should you arrive 10 or more minutes late.  We strive to give you quality time with our providers, and arriving late affects you and other patients whose appointments are after yours.  Also, if you no show three or more times for appointments you may be dismissed from the clinic at the providers discretion.     Again, thank you for choosing [facility].  Our hope is that these requests will decrease the amount of time that you wait before being seen by our physicians.       _____________________________________________________________  Should you have questions after your visit to [facility], please contact our office at [phone] between the hours of 8:30 a.m. and 4:30 p.m.  Voicemails left after 4:30 p.m. will not be returned until the following business day.  For  prescription refill requests, have your pharmacy contact our office.         Resources For Cancer Patients and their Caregivers ? American Cancer Society: Can assist with transportation, wigs, general needs, runs Look Good Feel Better.        [phone] ? Cancer Care: Provides financial assistance, online support groups, medication/co-pay assistance.  1-800-813-HOPE 501 633 7070) ? [place] Assists [place] cancer patients and their families through emotional , educational and financial support.  ([phone]) [phone] ? [place] DSS Where to apply for food stamps, Medicaid and utility assistance. 2243264079 ? [employer]: Transportation to medical appointments. 424-064-3559 ? Social Security Administration: May apply for disability if have a Stage IV cancer. [phone] 9806143330 ? [employer], Disability and Transit Services: Assists with nutrition, care and transit needs. [phone]

## 2015-07-26 ENCOUNTER — Telehealth: Payer: Self-pay

## 2015-07-26 DIAGNOSIS — H6122 Impacted cerumen, left ear: Secondary | ICD-10-CM

## 2015-07-26 NOTE — Telephone Encounter (Signed)
Referral entered  

## 2015-08-06 ENCOUNTER — Ambulatory Visit (HOSPITAL_COMMUNITY)
Admission: RE | Admit: 2015-08-06 | Discharge: 2015-08-06 | Disposition: A | Discharge status: Home / Self Care | Payer: Medicare Other | Source: Ambulatory Visit | Attending: Family Medicine | Admitting: Family Medicine

## 2015-08-06 DIAGNOSIS — Z1231 Encounter for screening mammogram for malignant neoplasm of breast: Secondary | ICD-10-CM | POA: Diagnosis not present

## 2015-08-08 ENCOUNTER — Encounter (HOSPITAL_BASED_OUTPATIENT_CLINIC_OR_DEPARTMENT_OTHER): Payer: Medicare Other

## 2015-08-08 VITALS — BP 163/63 | HR 58 | Temp 98.2°F | Resp 16 | Wt 144.8 lb

## 2015-08-08 DIAGNOSIS — C787 Secondary malignant neoplasm of liver and intrahepatic bile duct: Secondary | ICD-10-CM

## 2015-08-08 DIAGNOSIS — C801 Malignant (primary) neoplasm, unspecified: Secondary | ICD-10-CM | POA: Diagnosis not present

## 2015-08-08 DIAGNOSIS — Z5111 Encounter for antineoplastic chemotherapy: Secondary | ICD-10-CM | POA: Diagnosis not present

## 2015-08-08 DIAGNOSIS — C252 Malignant neoplasm of tail of pancreas: Secondary | ICD-10-CM | POA: Diagnosis not present

## 2015-08-08 DIAGNOSIS — C259 Malignant neoplasm of pancreas, unspecified: Secondary | ICD-10-CM | POA: Diagnosis not present

## 2015-08-08 LAB — CBC WITH DIFFERENTIAL/PLATELET
Basophils Absolute: 0 10*3/uL (ref 0.0–0.1)
Basophils Relative: 1 %
Eosinophils Absolute: 0 10*3/uL (ref 0.0–0.7)
Eosinophils Relative: 0 %
HEMATOCRIT: 29.4 % — AB (ref 36.0–46.0)
HEMOGLOBIN: 9.6 g/dL — AB (ref 12.0–15.0)
LYMPHS ABS: 1.1 10*3/uL (ref 0.7–4.0)
Lymphocytes Relative: 18 %
MCH: 27.8 pg (ref 26.0–34.0)
MCHC: 32.7 g/dL (ref 30.0–36.0)
MCV: 85.2 fL (ref 78.0–100.0)
MONOS PCT: 6 %
Monocytes Absolute: 0.4 10*3/uL (ref 0.1–1.0)
NEUTROS ABS: 4.7 10*3/uL (ref 1.7–7.7)
NEUTROS PCT: 75 %
Platelets: 239 10*3/uL (ref 150–400)
RBC: 3.45 MIL/uL — ABNORMAL LOW (ref 3.87–5.11)
RDW: 18.9 % — ABNORMAL HIGH (ref 11.5–15.5)
WBC: 6.3 10*3/uL (ref 4.0–10.5)

## 2015-08-08 LAB — COMPREHENSIVE METABOLIC PANEL
ALBUMIN: 3.8 g/dL (ref 3.5–5.0)
ALK PHOS: 79 U/L (ref 38–126)
ALT: 31 U/L (ref 14–54)
ANION GAP: 7 (ref 5–15)
AST: 28 U/L (ref 15–41)
BILIRUBIN TOTAL: 0.6 mg/dL (ref 0.3–1.2)
BUN: 18 mg/dL (ref 6–20)
CALCIUM: 8.4 mg/dL — AB (ref 8.9–10.3)
CO2: 24 mmol/L (ref 22–32)
Chloride: 111 mmol/L (ref 101–111)
Creatinine, Ser: 0.89 mg/dL (ref 0.44–1.00)
GFR calc Af Amer: >60 mL/min (ref >60)
GFR calc non Af Amer: 58 mL/min — ABNORMAL LOW (ref >60)
GLUCOSE: 125 mg/dL — AB (ref 65–99)
Potassium: 3.6 mmol/L (ref 3.5–5.1)
Sodium: 142 mmol/L (ref 135–145)
TOTAL PROTEIN: 6.9 g/dL (ref 6.5–8.1)

## 2015-08-08 MED ORDER — HEPARIN SOD (PORK) LOCK FLUSH 100 UNIT/ML IV SOLN
500.0000 [IU] | Freq: Once | INTRAVENOUS | Status: AC
  Administered 2015-08-08: 500 [IU] via INTRAVENOUS

## 2015-08-08 MED ORDER — PALONOSETRON HCL INJECTION 0.25 MG/5ML
INTRAVENOUS | Status: AC
  Filled 2015-08-08: qty 5

## 2015-08-08 MED ORDER — HEPARIN SOD (PORK) LOCK FLUSH 100 UNIT/ML IV SOLN
INTRAVENOUS | Status: AC
  Filled 2015-08-08: qty 5

## 2015-08-08 MED ORDER — SODIUM CHLORIDE 0.9 % IJ SOLN
10.0000 mL | INTRAMUSCULAR | Status: DC | PRN
  Administered 2015-08-08: 10 mL
  Filled 2015-08-08: qty 10

## 2015-08-08 MED ORDER — HEPARIN SOD (PORK) LOCK FLUSH 100 UNIT/ML IV SOLN: 500.0000 [IU] | Freq: Once | INTRAVENOUS | Status: AC | PRN

## 2015-08-08 MED ORDER — PACLITAXEL PROTEIN-BOUND CHEMO INJECTION 100 MG
125.0000 mg/m2 | Freq: Once | INTRAVENOUS | Status: AC
  Administered 2015-08-08: 225 mg via INTRAVENOUS
  Filled 2015-08-08: qty 45

## 2015-08-08 MED ORDER — SODIUM CHLORIDE 0.9 % IV SOLN
Freq: Once | INTRAVENOUS | Status: AC
  Administered 2015-08-08: 11:00:00 via INTRAVENOUS

## 2015-08-08 MED ORDER — SODIUM CHLORIDE 0.9 % IV SOLN
1000.0000 mg/m2 | Freq: Once | INTRAVENOUS | Status: AC
  Administered 2015-08-08: 1786 mg via INTRAVENOUS
  Filled 2015-08-08: qty 46.97

## 2015-08-08 MED ORDER — SODIUM CHLORIDE 0.9 % IV SOLN
Freq: Once | INTRAVENOUS | Status: AC
  Administered 2015-08-08: 8 mg via INTRAVENOUS
  Filled 2015-08-08: qty 4

## 2015-08-08 NOTE — Patient Instructions (Signed)
[facility] Discharge Instructions for Patients Receiving Chemotherapy   Beginning January 23rd 2017 lab work for the [facility] will be done in the  Main lab at [facility] on 1st floor. If you have a lab appointment with the [facility] please come in thru the  Main Entrance and check in at the main information desk   Today you received the following chemotherapy agents: abraxane and Gemzar.     If you develop nausea and vomiting, or diarrhea that is not controlled by your medication, call the clinic.  The clinic phone number is [phone]. Office hours are Monday-Friday 8:30am-5:00pm.  BELOW ARE SYMPTOMS THAT SHOULD BE REPORTED IMMEDIATELY:  *FEVER GREATER THAN 101.0 F  *CHILLS WITH OR WITHOUT FEVER  NAUSEA AND VOMITING THAT IS NOT CONTROLLED WITH YOUR NAUSEA MEDICATION  *UNUSUAL SHORTNESS OF BREATH  *UNUSUAL BRUISING OR BLEEDING  TENDERNESS IN MOUTH AND THROAT WITH OR WITHOUT PRESENCE OF ULCERS  *URINARY PROBLEMS  *BOWEL PROBLEMS  UNUSUAL RASH Items with * indicate a potential emergency and should be followed up as soon as possible. If you have an emergency after office hours please contact your primary care physician or go to the nearest emergency department.  Please call the clinic during office hours if you have any questions or concerns.   You may also contact the Patient Navigator at [phone] should you have any questions or need assistance in obtaining follow up care.      Resources For Cancer Patients and their Caregivers ? American Cancer Society: Can assist with transportation, wigs, general needs, runs Look Good Feel Better.        (540)002-2089 ? Cancer Care: Provides financial assistance, online support groups, medication/co-pay assistance.  1-800-813-HOPE 515-195-5250) ? [facility] Assists [place] cancer patients and their families through emotional , educational and financial support.   440-574-6552 ? [place] DSS Where to apply for food stamps, Medicaid and utility assistance. (901) 585-4144 ? [facility]: Transportation to medical appointments. 9252989000 ? Social Security Administration: May apply for disability if have a Stage IV cancer. 8160301484 [phone] ? [employer], Disability and Transit Services: Assists with nutrition, care and transit needs. 315-266-2434

## 2015-08-08 NOTE — Progress Notes (Signed)
Patient tolerated infusion well.  Lab values discussed during visit today, patient aware they are all stable.  VSS.  Patient ambulatory leaving the clinic.

## 2015-08-09 ENCOUNTER — Inpatient Hospital Stay (HOSPITAL_COMMUNITY): Payer: Medicare Other

## 2015-08-15 ENCOUNTER — Encounter (HOSPITAL_BASED_OUTPATIENT_CLINIC_OR_DEPARTMENT_OTHER): Payer: Medicare Other | Admitting: Oncology

## 2015-08-15 ENCOUNTER — Encounter (HOSPITAL_COMMUNITY): Payer: Medicare Other | Attending: Hematology and Oncology

## 2015-08-15 VITALS — BP 169/65 | HR 63 | Temp 97.8°F | Resp 16 | Wt 147.6 lb

## 2015-08-15 VITALS — BP 176/74 | HR 66 | Temp 97.0°F | Resp 18

## 2015-08-15 DIAGNOSIS — C787 Secondary malignant neoplasm of liver and intrahepatic bile duct: Secondary | ICD-10-CM | POA: Diagnosis not present

## 2015-08-15 DIAGNOSIS — D649 Anemia, unspecified: Secondary | ICD-10-CM | POA: Diagnosis not present

## 2015-08-15 DIAGNOSIS — C259 Malignant neoplasm of pancreas, unspecified: Secondary | ICD-10-CM | POA: Diagnosis not present

## 2015-08-15 DIAGNOSIS — Z66 Do not resuscitate: Secondary | ICD-10-CM

## 2015-08-15 DIAGNOSIS — C252 Malignant neoplasm of tail of pancreas: Secondary | ICD-10-CM | POA: Diagnosis not present

## 2015-08-15 DIAGNOSIS — Z5111 Encounter for antineoplastic chemotherapy: Secondary | ICD-10-CM | POA: Diagnosis present

## 2015-08-15 DIAGNOSIS — C801 Malignant (primary) neoplasm, unspecified: Secondary | ICD-10-CM | POA: Diagnosis not present

## 2015-08-15 LAB — RETICULOCYTES: RBC.: 3.39 MIL/uL — AB (ref 3.87–5.11)

## 2015-08-15 LAB — CBC WITH DIFFERENTIAL/PLATELET
BASOS ABS: 0 10*3/uL (ref 0.0–0.1)
BASOS PCT: 0 %
EOS ABS: 0 10*3/uL (ref 0.0–0.7)
EOS PCT: 0 %
HCT: 27.2 % — ABNORMAL LOW (ref 36.0–46.0)
Hemoglobin: 8.8 g/dL — ABNORMAL LOW (ref 12.0–15.0)
LYMPHS PCT: 27 %
Lymphs Abs: 0.9 10*3/uL (ref 0.7–4.0)
MCH: 27.8 pg (ref 26.0–34.0)
MCHC: 32.4 g/dL (ref 30.0–36.0)
MCV: 86.1 fL (ref 78.0–100.0)
MONO ABS: 0.1 10*3/uL (ref 0.1–1.0)
Monocytes Relative: 4 %
Neutro Abs: 2.3 10*3/uL (ref 1.7–7.7)
Neutrophils Relative %: 69 %
PLATELETS: 135 10*3/uL — AB (ref 150–400)
RBC: 3.16 MIL/uL — AB (ref 3.87–5.11)
RDW: 18.2 % — AB (ref 11.5–15.5)
WBC: 3.3 10*3/uL — AB (ref 4.0–10.5)

## 2015-08-15 LAB — COMPREHENSIVE METABOLIC PANEL
ALBUMIN: 3.5 g/dL (ref 3.5–5.0)
ALT: 21 U/L (ref 14–54)
AST: 21 U/L (ref 15–41)
Alkaline Phosphatase: 72 U/L (ref 38–126)
Anion gap: 8 (ref 5–15)
BUN: 16 mg/dL (ref 6–20)
CHLORIDE: 107 mmol/L (ref 101–111)
CO2: 26 mmol/L (ref 22–32)
CREATININE: 0.82 mg/dL (ref 0.44–1.00)
Calcium: 8.3 mg/dL — ABNORMAL LOW (ref 8.9–10.3)
GFR calc Af Amer: >60 mL/min (ref >60)
Glucose, Bld: 140 mg/dL — ABNORMAL HIGH (ref 65–99)
POTASSIUM: 3.5 mmol/L (ref 3.5–5.1)
SODIUM: 141 mmol/L (ref 135–145)
Total Bilirubin: 0.4 mg/dL (ref 0.3–1.2)
Total Protein: 6.5 g/dL (ref 6.5–8.1)

## 2015-08-15 LAB — VITAMIN B12: VITAMIN B 12: 369 pg/mL (ref 180–914)

## 2015-08-15 LAB — IRON AND TIBC
Iron: 29 ug/dL (ref 28–170)
Saturation Ratios: 10 % — ABNORMAL LOW (ref 10.4–31.8)
TIBC: 279 ug/dL (ref 250–450)
UIBC: 250 ug/dL

## 2015-08-15 LAB — FERRITIN: FERRITIN: 287 ng/mL (ref 11–307)

## 2015-08-15 MED ORDER — SODIUM CHLORIDE 0.9 % IV SOLN
Freq: Once | INTRAVENOUS | Status: AC
  Administered 2015-08-15: 8 mg via INTRAVENOUS
  Filled 2015-08-15: qty 4

## 2015-08-15 MED ORDER — SODIUM CHLORIDE 0.9 % IJ SOLN: 10.0000 mL | INTRAMUSCULAR | Status: DC | PRN

## 2015-08-15 MED ORDER — SODIUM CHLORIDE 0.9 % IV SOLN
1000.0000 mg/m2 | Freq: Once | INTRAVENOUS | Status: AC
  Administered 2015-08-15: 1786 mg via INTRAVENOUS
  Filled 2015-08-15: qty 46.97

## 2015-08-15 MED ORDER — HEPARIN SOD (PORK) LOCK FLUSH 100 UNIT/ML IV SOLN: 500.0000 [IU] | Freq: Once | INTRAVENOUS | Status: AC | PRN

## 2015-08-15 MED ORDER — HEPARIN SOD (PORK) LOCK FLUSH 100 UNIT/ML IV SOLN
500.0000 [IU] | Freq: Once | INTRAVENOUS | Status: AC
  Administered 2015-08-15: 500 [IU] via INTRAVENOUS

## 2015-08-15 MED ORDER — HEPARIN SOD (PORK) LOCK FLUSH 100 UNIT/ML IV SOLN
INTRAVENOUS | Status: AC
  Filled 2015-08-15: qty 5

## 2015-08-15 MED ORDER — PACLITAXEL PROTEIN-BOUND CHEMO INJECTION 100 MG
125.0000 mg/m2 | Freq: Once | INTRAVENOUS | Status: AC
  Administered 2015-08-15: 225 mg via INTRAVENOUS
  Filled 2015-08-15: qty 45

## 2015-08-15 MED ORDER — SODIUM CHLORIDE 0.9 % IV SOLN
Freq: Once | INTRAVENOUS | Status: AC
  Administered 2015-08-15: 11:00:00 via INTRAVENOUS

## 2015-08-15 NOTE — Patient Instructions (Signed)
[facility] Discharge Instructions for Patients Receiving Chemotherapy   Beginning January 23rd 2017 lab work for the Cancer Center will be done in the  Main lab at [facility] on 1st floor. If you have a lab appointment with the Cancer Center please come in thru the  Main Entrance and check in at the main information desk   Today you received the following chemotherapy agents:  Abraxane and Gemzar.  If you develop nausea and vomiting, or diarrhea that is not controlled by your medication, call the clinic.  The clinic phone number is [phone]. Office hours are Monday-Friday 8:30am-5:00pm.  BELOW ARE SYMPTOMS THAT SHOULD BE REPORTED IMMEDIATELY:  *FEVER GREATER THAN 101.0 F  *CHILLS WITH OR WITHOUT FEVER  NAUSEA AND VOMITING THAT IS NOT CONTROLLED WITH YOUR NAUSEA MEDICATION  *UNUSUAL SHORTNESS OF BREATH  *UNUSUAL BRUISING OR BLEEDING  TENDERNESS IN MOUTH AND THROAT WITH OR WITHOUT PRESENCE OF ULCERS  *URINARY PROBLEMS  *BOWEL PROBLEMS  UNUSUAL RASH Items with * indicate a potential emergency and should be followed up as soon as possible. If you have an emergency after office hours please contact your primary care physician or go to the nearest emergency department.  Please call the clinic during office hours if you have any questions or concerns.   You may also contact the Patient Navigator at [phone] should you have any questions or need assistance in obtaining follow up care.      Resources For Cancer Patients and their Caregivers ? American Cancer Society: Can assist with transportation, wigs, general needs, runs Look Good Feel Better.        1-888-227-6333 ? Cancer Care: Provides financial assistance, online support groups, medication/co-pay assistance.  1-800-813-HOPE (4673) ? [facility] Assists [place] cancer patients and their families through emotional , educational and financial support.   336-427-4357 ? [place] DSS Where to apply for food stamps, Medicaid and utility assistance. 336-342-1394 ? [facility]: Transportation to medical appointments. 336-347-2287 ? Social Security Administration: May apply for disability if have a Stage IV cancer. 336-342-7796 1-800-772-1213 ? [place] [employer], Disability and Transit Services: Assists with nutrition, care and transit needs. 336-349-2343         

## 2015-08-15 NOTE — Progress Notes (Signed)
Patient tolerated infusion well.  VSS.   

## 2015-08-15 NOTE — Assessment & Plan Note (Addendum)
Stage IV Pancreatic Cancer, on systemic therapy with Abraxane and Gemzar in a day 1, 8 fashion every 28 days.  Continued response to therapy.  Oncology history is up to date.   Patient has entered into a pain contract with her primary care provider for non-oncologic pain.  She has been followed by Dr. Luna Glasgow (Ortho) for her knee and ankle pain.  As a result of her pain contract, I will defer refills of muscle relaxant to her primary care provider if necessary.  She lists a number of medications that need refilled.  These are prescribed by her primary care provider.  As a result, I recommended that she contact her pharmacy for refill requests.  She is advised to follow-up with her ophthalmologist for vision changes.  Pre-chemo labs as ordered: CBC diff, CMET, CA 19-9.   Progressive anemia is noted and this is likely secondary to systemic chemotherapy, HOWEVER, I will perform an anemia work-up to further guide treatment options for anemia: anemia panel, retic count, EPO level, and stool cards x 3.  If work-up is unimpressive, will need to discuss ESA initiation at 500 mcg every 21 days.  Given her long time on treatment, if her QOL were to be affected from chemotherapy, a holiday may be indicated.  We will continue to monitor for side effects of therapy and her QOL.  Living will, goals-of-care, and code status are again addressed today.  CODE STATUS discussed and she wishes to be a DNR.  This is added to her problem list, order is placed in [facility], oncology history updated, and yellow DNR form is completed.  DNR  Return in 4 weeks for follow-up.  No imaging studies ordered/scheduled at this time and will be addressed in the future.

## 2015-08-15 NOTE — Progress Notes (Signed)
[name], MD [address] [place] [address]  Pancreatic cancer metastasized to liver [facility]) - Plan: DNR (Do Not Resuscitate)  Anemia, unspecified anemia type - Plan: Vitamin B12, Folate, Iron and TIBC, Ferritin, Erythropoietin, Reticulocytes, Occult blood card to lab, stool, Occult blood card to lab, stool, Occult blood card to lab, stool  DNR (do not resuscitate)  CURRENT THERAPY: Palliative Gemcitabine/Abraxane  INTERVAL HISTORY: [name] 82 y.o. female returns for followup of Stage IV pancreatic cancer.    Pancreatic cancer metastasized to liver ([place])   03/10/2014 Initial Diagnosis Pancreatic cancer metastasized to liver   03/22/2014 -  Chemotherapy Abraxane/Gemzar days 1, 8, every 28 days.  Day 15 was cancelled due to leukopenia and thrombocytopenia on day 15 cycle 1.   05/24/2014 Treatment Plan Change Day 8 of cycle 3 is held with ANC of 1.1   06/05/2014 Imaging CT C/A/P . Interval decrease in size of the pancreatic tail mass.Improved hepatic metastatic disease. No new lesions. No CT findings for metastatic disease involving the chest.   08/09/2014 Tumor Marker CA 19-9= 33 (WNL)   10/26/2014 Imaging MRI- Continued interval decrease in size of the hepatic metastatic lesions and no new lesions are identified. Continued decrease in size of the pancreatic tail lesion.   01/03/2015 Tumor Marker Results for [name], [name] (MRN [id number]) as of 01/18/2015 12:52  01/03/2015 10:00 CA 19-9: 14    01/17/2015 Imaging MRI- Response to therapy of hepatic metastasis.  Similar size of a pancreatic tail lesion.   03/20/2015 Imaging MRI-L spine- Severe disc space narrowing at L2-L3, with endplate reactive changes. Large disc extrusion into the ventral epidural space,central to the RIGHT with a cephalad migrated free fragment. Additional Large disc extrusion into the retroperitone...   03/22/2015 Imaging CT pelvis- No evidence for metastatic disease within the pelvis.     07/24/2015 Imaging MRI abd- Continued response to therapy, with no residual detectable liver metastases. No new sites of metastatic disease in the abdomen.    08/15/2015 Code Status  She confirms desire for DNR status.    I personally reviewed and went over laboratory results with the patient.  The results are noted within this dictation.  Labs will be updated today.  Labs meet treatment parameters today.  Progressive anemia is noted, likely from chemotherapy, but further lab testing will be done today. Klonopin, and Flexeril.  These are medications that are managed by her primary care provider.  Therefore, I have advised the patient to call her pharmacy and request refills.  This will be communicated to Dr. Moshe Cipro.  She is involved with a pain contrast with Dr. Moshe Cipro and therefore, I will not provide a refill on her Flexeril.  I will defer this to her primary care provider.  I do not want to interfere with the patient's pain contrast. 3. Fatigue.  She reports that her fatigue is stable compared to Christmas time 2016.  She notes that it is no worse.   4. Right knee ache.  This is being followed by Dr. Luna Glasgow.  I have advised the patient to reach out to Dr. Luna Glasgow for this.  I have reviewed Dr. Luna Glasgow last note at which time [name] reported her pain had resolved.   5. Vision changes.  She has difficulty with reading small print.  She had an eye exam 1 month ago.  She notes that there was no changes in her eye glass prescription.  She is advised to report  this to her ophthalmologist.   6. She reports that her appetite is at 50%.  Her weight is stable over the past 4 months without any significant change.  She notes that her advanced directives are completed, but she needs them notarized.  I have requested that she bring them to the clinic with her next appointment and we can get them notarized for her.  She is agreeable to this.  I discussed CODE STATUS with the patient and she was clear in her  wishes to be a DNR.  "I do not want life support and when my time comes, I want to go peacefully."     Past Medical History  Diagnosis Date  . Hypertension 20 years   . Chronic diarrhea   . Anxiety   . Depression   . Kidney stone     hx/ crushed   . Hx of adenomatous colonic polyps     tubular adenomas, last found in 2008  . GERD (gastroesophageal reflux disease)   . Erosive esophagitis   . Schatzki's ring 08/26/10    Last dilated on EGD by Dr. Trevor Iha [facility], linear gastric erosions, BI hemigastrectomy  . Hyperplastic colon polyp 03/19/10    tcs by Dr. Gala Romney  . Pancreatic cancer ([place]) 02/2014  . Pancreatic cancer metastasized to liver ([place]) 03/10/2014  . Opioid contract exists 04/18/2015    With Dr. Moshe Cipro  . DNR (do not resuscitate) 08/16/2015    has Essential hypertension; GERD (gastroesophageal reflux disease); Anxiety; Primary generalized (osteo)arthritis; Back pain with radiation; IGT (impaired glucose tolerance); Anemia, iron deficiency; Elevated LFTs; Abnormal liver ultrasound; Pancreatic cancer metastasized to liver [facility]); Allergic cough; Allergic rhinitis; Cough; Heme positive stool; Knee pain, right; Opioid contract exists; Hx of adenomatous colonic polyps; Chronic low back pain with sciatica; and DNR (do not resuscitate) on her problem list.     is allergic to iohexol; aciphex; amlodipine besylate-valsartan; esomeprazole magnesium; omeprazole; penicillins; and ciprofloxacin.  Current Outpatient Prescriptions on File Prior to Visit  Medication Sig Dispense Refill  . amLODipine-olmesartan (AZOR) 10-40 MG tablet Take 1 tablet by mouth daily. 90 tablet 0  . aspirin 81 MG EC tablet Take 81 mg by mouth every morning.     Marland Kitchen azelastine (ASTELIN) 0.1 % nasal spray Place 2 sprays into both nostrils 2 (two) times daily. Use in each nostril as directed 30 mL 12  . BYSTOLIC 10 MG tablet TAKE 1 TABLET DAILY 90 tablet 0  . cetirizine (ZYRTEC) 10 MG tablet Take 1 tablet (10 mg total) by  mouth every morning. 30 tablet 3  . cholestyramine light (PREVALITE) 4 GM/DOSE powder TAKE 1 SCOOP BY MOUTH DAILY 239.4 g 3  . clonazePAM (KLONOPIN) 0.5 MG tablet TAKE 1 TABLET BY MOUTH TWICE DAILY AS NEEDED FOR ANXIETY 60 tablet 2  . cycloSPORINE (RESTASIS) 0.05 % ophthalmic emulsion Place 1 drop into both eyes 2 (two) times daily.    . ferrous sulfate 325 (65 FE) MG tablet Take 325 mg by mouth daily with breakfast.    . fluticasone (FLONASE) 50 MCG/ACT nasal spray Place 2 sprays into both nostrils daily.    Marland Kitchen gabapentin (NEURONTIN) 100 MG capsule Take 1 capsule (100 mg total) by mouth at bedtime. 30 capsule 3  . HYDROcodone-acetaminophen (NORCO/VICODIN) 5-325 MG tablet One tablet twice daily 60 tablet 0  . lidocaine-prilocaine (EMLA) cream Apply a quarter size amount to port site 1 hour prior to chemo. Do not rub in. Cover with plastic wrap. 30 g 3  .  pantoprazole (PROTONIX) 20 MG tablet TAKE 1 TABLET(20 MG) BY MOUTH DAILY 90 tablet 0  . predniSONE (DELTASONE) 5 MG tablet Take 1 tablet (5 mg total) by mouth daily. 30 tablet 3  . Probiotic Product (HEALTHY COLON PO) Take 1 tablet by mouth every morning.     . senna (SENOKOT) 8.6 MG tablet Take 1 tablet by mouth daily.    . cyclobenzaprine (FLEXERIL) 10 MG tablet Take 1 tablet (10 mg total) by mouth 2 (two) times daily as needed for muscle spasms. (Patient not taking: Reported on 08/15/2015) 30 tablet 1  . diphenhydrAMINE (BENADRYL) 50 MG tablet 50 mg PO 1 hour prior to CT scan (Patient not taking: Reported on 08/15/2015) 1 tablet 0  . montelukast (SINGULAIR) 10 MG tablet Take 1 tablet (10 mg total) by mouth at bedtime. (Patient not taking: Reported on 08/15/2015) 30 tablet 3  . Omega-3 Fatty Acids (OMEGA 3 PO) Take by mouth. Reported on 08/15/2015    . potassium chloride (MICRO-K) 10 MEQ CR capsule Take 1 capsule (10 mEq total) by mouth 2 (two) times daily. (Patient not taking: Reported on 08/15/2015) 60 capsule 1  . prochlorperazine (COMPAZINE) 10 MG  tablet Take 1 tablet (10 mg total) by mouth every 6 (six) hours as needed for nausea or vomiting. (Patient not taking: Reported on 07/25/2015) 60 tablet 2   Current Facility-Administered Medications on File Prior to Visit  Medication Dose Route Frequency Provider Last Rate Last Dose  . dexamethasone (DECADRON) 10 mg in sodium chloride 0.9 % 50 mL IVPB   Intravenous Once Patrici Ranks, MD        Past Surgical History  Procedure Laterality Date  . Stomach ulcer  50 years ago     had some of her stomach removed   . Bunion removal      from both feet   . Left shoulder surgery  2009    DR HARRISON  . Colonoscopy  03/19/2010    DR Gala Romney,, normal TI, pancolonic diverticula, random colon bx neg., hyperplastic polyps removed  . Esophagogastroduodenoscopy  11/22/2003    DR Gala Romney, erosive RE, Billroth I  . Esophagogastroduodenoscopy  08/26/10    Dr. Gala Romney- moderate severe ERE, Scahtzki ring s/p dilation, Billroth I, linear gastric erosions, bx-gastric xanthelasma  . Breast lumpectomy Left   . Orif ankle fracture Right 05/22/2013    Procedure: OPEN REDUCTION INTERNAL FIXATION (ORIF) RIGHT ANKLE FRACTURE;  Surgeon: Sanjuana Kava, MD;  Location: [facility];  Service: Orthopedics;  Laterality: Right;  . Cholecystectomy  1965     Denies any headaches, dizziness, double vision, fevers, chills, night sweats, nausea, vomiting, diarrhea, constipation, chest pain, heart palpitations, shortness of breath, blood in stool, black tarry stool, urinary pain, urinary burning, urinary frequency, hematuria.   PHYSICAL EXAMINATION  ECOG PERFORMANCE STATUS: 2 - Symptomatic, <50% confined to bed  Filed Vitals:   08/15/15 0930  BP: 169/65  Pulse: 63  Temp: 97.8 F (36.6 C)  Resp: 16    GENERAL:alert, well nourished, well developed, comfortable, cooperative, smiling, in chemo-recliner, unaccompanied at this time, accessed for chemotherapy administration.  SKIN: skin color, texture, turgor are normal, no rashes or  significant lesions HEAD: Normocephalic, No masses, lesions, tenderness or abnormalities EYES: normal, PERRLA, EOMI, Conjunctiva are pink and non-injected EARS: External ears normal OROPHARYNX:lips, buccal mucosa, and tongue normal and mucous membranes are moist  NECK: supple, trachea midline LYMPH:  no palpable lymphadenopathy BREAST:not examined LUNGS: clear to auscultation without wheezes, rales, or rhonchi. HEART: regular  rate & rhythm ABDOMEN:abdomen soft and normal bowel sounds, nontender BACK: Back symmetric, no curvature. EXTREMITIES:less then 2 second capillary refill, no joint deformities, effusion, or inflammation, no skin discoloration, no cyanosis  NEURO: alert & oriented x 3 with fluent speech, no focal motor/sensory deficits.    LABORATORY DATA: CBC    Component Value Date/Time   WBC 3.3* 08/15/2015 0930   RBC 3.39* 08/15/2015 1300   RBC 3.16* 08/15/2015 0930   HGB 8.8* 08/15/2015 0930   HCT 27.2* 08/15/2015 0930   PLT 135* 08/15/2015 0930   MCV 86.1 08/15/2015 0930   MCH 27.8 08/15/2015 0930   MCHC 32.4 08/15/2015 0930   RDW 18.2* 08/15/2015 0930   LYMPHSABS 0.9 08/15/2015 0930   MONOABS 0.1 08/15/2015 0930   EOSABS 0.0 08/15/2015 0930   BASOSABS 0.0 08/15/2015 0930      Chemistry      Component Value Date/Time   NA 141 08/15/2015 0930   K 3.5 08/15/2015 0930   CL 107 08/15/2015 0930   CO2 26 08/15/2015 0930   BUN 16 08/15/2015 0930   CREATININE 0.82 08/15/2015 0930   CREATININE 0.97 02/14/2014 0758      Component Value Date/Time   CALCIUM 8.3* 08/15/2015 0930   ALKPHOS 72 08/15/2015 0930   AST 21 08/15/2015 0930   ALT 21 08/15/2015 0930   BILITOT 0.4 08/15/2015 0930      Results for MACK, SHAIK (MRN [id number]) as of 08/15/2015 07:48  Ref. Range 03/21/2015 10:08 04/18/2015 08:43 05/16/2015 10:00 06/13/2015 09:29 07/11/2015 09:15  CA 19-9 Latest Ref Range: 0-35 U/mL 19 16 14 17 13    PENDING LABS:   RADIOGRAPHIC STUDIES:  Mr Abdomen W Wo  Contrast  07/24/2015  CLINICAL DATA:  Stage IV pancreatic tail cancer presenting for restaging status post therapy. EXAM: MRI ABDOMEN WITHOUT AND WITH CONTRAST TECHNIQUE: Multiplanar multisequence MR imaging of the abdomen was performed both before and after the administration of intravenous contrast. CONTRAST:  46mL MULTIHANCE GADOBENATE DIMEGLUMINE 529 MG/ML IV SOLN COMPARISON:  01/17/2015 MRI abdomen. FINDINGS: Motion degraded study. Lower chest: Top-normal heart size. Hepatobiliary: Normal liver size and configuration. No hepatic steatosis. No residual liver masses. The previously described 0.6 cm subcapsular T2 hyperintense segment 8 right liver lobe lesion has resolved. The previously described 0.9 cm segment 7 right liver lobe lesion has resolved. Mild heterogeneity within the periphery of the right liver lobe on the arterial phase sequence is not appreciably changed and is occult on all other sequences, in keeping with transient perfusional phenomena. Cholecystectomy. No intrahepatic biliary ductal dilatation. Common bile duct diameter 8 mm, within expected post cholecystectomy limits. No evidence of choledocholithiasis. Pancreas: There is a heterogeneously enhancing and heterogeneous T2 signal 2.0 x 1.4 cm pancreatic tail mass (series 11/ image 27), previously 2.2 x 1.7 cm on 01/17/2015 using similar measurement technique, mildly decreased in size. There is stable top-normal caliber of the main pancreatic duct. No new pancreatic masses. No pancreas divisum. Spleen: Normal size. No mass. Adrenals/Urinary Tract: Normal adrenals. No hydronephrosis. There are several small renal cysts in both kidneys that demonstrate precontrast T1 hyperintensity, without obvious enhancement on the limited motion degraded postcontrast images, largest 2.1 cm in the posterior right kidney, not definitely changed, favor Bosniak category 2 hemorrhagic/ proteinaceous renal cysts. There are numerous additional small simple appearing  renal cysts in both kidneys. Stomach/Bowel: Grossly normal stomach. Visualized small and large bowel is normal caliber, with no bowel wall thickening. Scattered diverticula are seen throughout the visualized colon. Vascular/Lymphatic:  Atherosclerotic nonaneurysmal abdominal aorta. Patent portal, splenic, hepatic and renal veins. No pathologically enlarged lymph nodes in the abdomen. Other: No abdominal ascites or focal fluid collection. Musculoskeletal: No aggressive appearing focal osseous lesions. IMPRESSION: 1. Significantly motion degraded study. Consider future imaging follow-up with CT given the patient's limited ability to breath hold. 2. Continued response to therapy, with no residual detectable liver metastases. No new sites of metastatic disease in the abdomen. 3. Mildly decreased size of the primary pancreatic tail malignancy. 4. Probably benign Bosniak category 1 and category 2 renal cysts in both kidneys, difficult to characterize given motion degradation. Electronically Signed   By: Ilona Sorrel M.D.   On: 07/24/2015 15:33   Mr 3d Recon At Scanner  07/24/2015  CLINICAL DATA:  Stage IV pancreatic tail cancer presenting for restaging status post therapy. EXAM: MRI ABDOMEN WITHOUT AND WITH CONTRAST TECHNIQUE: Multiplanar multisequence MR imaging of the abdomen was performed both before and after the administration of intravenous contrast. CONTRAST:  64mL MULTIHANCE GADOBENATE DIMEGLUMINE 529 MG/ML IV SOLN COMPARISON:  01/17/2015 MRI abdomen. FINDINGS: Motion degraded study. Lower chest: Top-normal heart size. Hepatobiliary: Normal liver size and configuration. No hepatic steatosis. No residual liver masses. The previously described 0.6 cm subcapsular T2 hyperintense segment 8 right liver lobe lesion has resolved. The previously described 0.9 cm segment 7 right liver lobe lesion has resolved. Mild heterogeneity within the periphery of the right liver lobe on the arterial phase sequence is not  appreciably changed and is occult on all other sequences, in keeping with transient perfusional phenomena. Cholecystectomy. No intrahepatic biliary ductal dilatation. Common bile duct diameter 8 mm, within expected post cholecystectomy limits. No evidence of choledocholithiasis. Pancreas: There is a heterogeneously enhancing and heterogeneous T2 signal 2.0 x 1.4 cm pancreatic tail mass (series 11/ image 27), previously 2.2 x 1.7 cm on 01/17/2015 using similar measurement technique, mildly decreased in size. There is stable top-normal caliber of the main pancreatic duct. No new pancreatic masses. No pancreas divisum. Spleen: Normal size. No mass. Adrenals/Urinary Tract: Normal adrenals. No hydronephrosis. There are several small renal cysts in both kidneys that demonstrate precontrast T1 hyperintensity, without obvious enhancement on the limited motion degraded postcontrast images, largest 2.1 cm in the posterior right kidney, not definitely changed, favor Bosniak category 2 hemorrhagic/ proteinaceous renal cysts. There are numerous additional small simple appearing renal cysts in both kidneys. Stomach/Bowel: Grossly normal stomach. Visualized small and large bowel is normal caliber, with no bowel wall thickening. Scattered diverticula are seen throughout the visualized colon. Vascular/Lymphatic: Atherosclerotic nonaneurysmal abdominal aorta. Patent portal, splenic, hepatic and renal veins. No pathologically enlarged lymph nodes in the abdomen. Other: No abdominal ascites or focal fluid collection. Musculoskeletal: No aggressive appearing focal osseous lesions. IMPRESSION: 1. Significantly motion degraded study. Consider future imaging follow-up with CT given the patient's limited ability to breath hold. 2. Continued response to therapy, with no residual detectable liver metastases. No new sites of metastatic disease in the abdomen. 3. Mildly decreased size of the primary pancreatic tail malignancy. 4. Probably  benign Bosniak category 1 and category 2 renal cysts in both kidneys, difficult to characterize given motion degradation. Electronically Signed   By: Ilona Sorrel M.D.   On: 07/24/2015 15:33   Mm Screening Breast Tomo Bilateral  08/08/2015  CLINICAL DATA:  Screening. EXAM: 2D DIGITAL SCREENING BILATERAL MAMMOGRAM WITH CAD AND ADJUNCT TOMO COMPARISON:  Previous exam(s). ACR Breast Density Category b: There are scattered areas of fibroglandular density. FINDINGS: There are no findings suspicious for  malignancy. Images were processed with CAD. IMPRESSION: No mammographic evidence of malignancy. A result letter of this screening mammogram will be mailed directly to the patient. RECOMMENDATION: Screening mammogram in one year. (Code:SM-B-01Y) BI-RADS CATEGORY  1: Negative. Electronically Signed   By: Nolon Nations M.D.   On: 08/08/2015 12:23     PATHOLOGY:    ASSESSMENT AND PLAN:  Pancreatic cancer metastasized to liver Stage IV Pancreatic Cancer, on systemic therapy with Abraxane and Gemzar in a day 1, 8 fashion every 28 days.  Continued response to therapy.  Oncology history is up to date.   Patient has entered into a pain contract with her primary care provider for non-oncologic pain.  She has been followed by Dr. Luna Glasgow (Ortho) for her knee and ankle pain.  As a result of her pain contract, I will defer refills of muscle relaxant to her primary care provider if necessary.  She lists a number of medications that need refilled.  These are prescribed by her primary care provider.  As a result, I recommended that she contact her pharmacy for refill requests.  She is advised to follow-up with her ophthalmologist for vision changes.  Pre-chemo labs as ordered: CBC diff, CMET, CA 19-9.   Progressive anemia is noted and this is likely secondary to systemic chemotherapy, HOWEVER, I will perform an anemia work-up to further guide treatment options for anemia: anemia panel, retic count, EPO level, and  stool cards x 3.  If work-up is unimpressive, will need to discuss ESA initiation at 500 mcg every 21 days.  Given her long time on treatment, if her QOL were to be affected from chemotherapy, a holiday may be indicated.  We will continue to monitor for side effects of therapy and her QOL.  Living will, goals-of-care, and code status are again addressed today.  CODE STATUS discussed and she wishes to be a DNR.  This is added to her problem list, order is placed in [facility], oncology history updated, and yellow DNR form is completed.  DNR  Return in 4 weeks for follow-up.  No imaging studies ordered/scheduled at this time and will be addressed in the future.    THERAPY PLAN:  Continue treatment as planned, unless imaging is concerning for progression of disease.  All questions were answered. The patient knows to call the clinic with any problems, questions or concerns. We can certainly see the patient much sooner if necessary.  Patient and plan discussed with Dr. Ancil Linsey and she is in agreement with the aforementioned.   This note is electronically signed by: Doy Mince 08/16/2015 12:09 PM

## 2015-08-15 NOTE — Patient Instructions (Addendum)
Stool for Occult Blood Test WHY AM I HAVING THIS TEST? Stool for occult blood, or fecal occult blood test (FOBT), is a test that is used to screen for gastrointestinal (GI) bleeding, which may be an indicator of colon cancer. This test can also detect small amounts of blood in your stool (feces) from other causes, such as ulcers or hemorrhoids. This test is usually done as part of an annual routine examination after age 80. WHAT KIND OF SAMPLE IS TAKEN? A sample of your stool is required for this test. Your health care provider may collect the sample with a swab of the rectum. Or, you may be instructed to collect the sample in a container at home. If you are instructed to collect the sample, your health care provider will provide you with the instructions and the supplies that you will need to do that. WILL I NEED TO COLLECT SAMPLES AT HOME?  A stool sample may need to be collected at home. When collecting a sample at home, make sure that you:  Use the sterile containers and other supplies that were given to you from the lab.  Do not mix urine, toilet paper, or water with your sample.  Label all slides and containers with your name and the date when you collected the sample. Your health care provider or lab staff will give you one or more test "cards." You will collect a separate sample from three different stools, usually on different days that follow each other. Follow these steps for each sample: 1. Collect a stool sample into a clean container. 2. With an applicator stick, apply a thin smear of stool sample onto each filter paper square or window that is on the card. 3. Allow the filter paper to dry. After it is dry, the sample will be stable. Usually, you will return all of the samples to your health care provider or lab at the same time.  HOW DO I PREPARE FOR THE TEST?  Do not eat any red meat within three days before testing.  Follow your health care provider's instructions about eating  and drinking prior to the test. Your health care provider may instruct you to avoid other foods or substances.  Ask your health care provider about taking or not taking your medicines prior to the test. You may be instructed to avoid certain medicines that are known to interfere with this test. HOW ARE THE TEST RESULTS REPORTED? Your test results will be reported as either positive or negative. It is your responsibility to obtain your test results. Ask the lab or department performing the test when and how you will get your results. WHAT DO THE RESULTS MEAN? A negative test result means that there is no occult blood within the stool. A negative result is normal. A positive test result may mean that there is blood in the stool. Causes of blood in the stool include:  GI tumors.  Certain GI diseases.  GI trauma or recent surgery.  Hemorrhoids. Talk with your health care provider to discuss your results, treatment options, and if necessary, the need for more tests. Talk with your health care provider if you have any questions about your results.   This information is not intended to replace advice given to you by your health care provider. Make sure you discuss any questions you have with your health care provider.   Document Released: 05/23/2004 Document Revised: 05/19/2014 Document Reviewed: 09/23/2013 Elsevier Interactive Patient Education 2016 [place]  at [facility] Discharge Instructions  RECOMMENDATIONS MADE BY THE CONSULTANT AND ANY TEST RESULTS WILL BE SENT TO YOUR REFERRING PHYSICIAN.  Exam done and seen today by Kirby Crigler Bring your advanced directive back with you. We are giving you a DNR form Prednisone refilled times 3 on 07-24-15 Call your pharmacy for the following refills: Kdur, protonix, klonopin If you have continued issue with vision, follow up with opthalmogist.  Treatment as planned. We added labs today to the ones already  drawn.  Need to do stool cards times three, return those at anytime. Return to see the Doctor in 4 weeks Call the clinic for any concerns or questions.  Thank you for choosing [place] at [facility] [address] to provide your oncology and hematology care.  To afford each patient quality time with our provider, please arrive at least 15 minutes before your scheduled appointment time.   Beginning January 23rd 2017 lab work for the [name] will be done in the  Main lab at [facility] on 1st floor. If you have a lab appointment with the [facility] please come in thru the  Main Entrance and check in at the main information desk  You need to re-schedule your appointment should you arrive 10 or more minutes late.  We strive to give you quality time with our providers, and arriving late affects you and other patients whose appointments are after yours.  Also, if you no show three or more times for appointments you may be dismissed from the clinic at the providers discretion.     Again, thank you for choosing [facility].  Our hope is that these requests will decrease the amount of time that you wait before being seen by our physicians.       _____________________________________________________________  Should you have questions after your visit to [facility], please contact our office at [phone] between the hours of 8:30 a.m. and 4:30 p.m.  Voicemails left after 4:30 p.m. will not be returned until the following business day.  For prescription refill requests, have your pharmacy contact our office.         Resources For Cancer Patients and their Caregivers ? American Cancer Society: Can assist with transportation, wigs, general needs, runs Look Good Feel Better.        (304) 378-0417 ? Cancer Care: Provides financial assistance, online support groups, medication/co-pay assistance.  1-800-813-HOPE 7608530451) ? [facility] Assists [place] cancer patients and their families through emotional , educational and financial support.  (867)574-9288 ? [place] DSS Where to apply for food stamps, Medicaid and utility assistance. (270)540-8704 ? [employer]: Transportation to medical appointments. 2104962672 ? Social Security Administration: May apply for disability if have a Stage IV cancer. ([phone] 052-1526 934-490-9795 ? [employer], Disability and Transit Services: Assists with nutrition, care and transit needs. 769-127-5694

## 2015-08-16 ENCOUNTER — Encounter (HOSPITAL_COMMUNITY): Payer: Self-pay | Admitting: Oncology

## 2015-08-16 DIAGNOSIS — Z66 Do not resuscitate: Secondary | ICD-10-CM

## 2015-08-16 HISTORY — DX: Do not resuscitate: Z66

## 2015-08-16 LAB — ERYTHROPOIETIN: ERYTHROPOIETIN: 55.7 m[IU]/mL — AB (ref 2.6–18.5)

## 2015-08-16 LAB — FOLATE: FOLATE: 15.2 ng/mL (ref >5.9)

## 2015-08-23 ENCOUNTER — Encounter: Payer: Self-pay | Admitting: Orthopaedic Surgery

## 2015-08-23 ENCOUNTER — Ambulatory Visit (INDEPENDENT_AMBULATORY_CARE_PROVIDER_SITE_OTHER): Payer: Medicare Other | Admitting: Orthopaedic Surgery

## 2015-08-23 VITALS — BP 132/57 | HR 59 | Temp 96.8°F | Ht 63.0 in | Wt 145.2 lb

## 2015-08-23 DIAGNOSIS — C252 Malignant neoplasm of tail of pancreas: Secondary | ICD-10-CM | POA: Diagnosis not present

## 2015-08-23 DIAGNOSIS — M25561 Pain in right knee: Secondary | ICD-10-CM

## 2015-08-23 DIAGNOSIS — C259 Malignant neoplasm of pancreas, unspecified: Secondary | ICD-10-CM | POA: Diagnosis not present

## 2015-08-23 DIAGNOSIS — C787 Secondary malignant neoplasm of liver and intrahepatic bile duct: Secondary | ICD-10-CM | POA: Diagnosis not present

## 2015-08-23 DIAGNOSIS — C801 Malignant (primary) neoplasm, unspecified: Secondary | ICD-10-CM | POA: Diagnosis not present

## 2015-08-23 LAB — OCCULT BLOOD X 1 CARD TO LAB, STOOL
Fecal Occult Bld: POSITIVE — AB
Fecal Occult Bld: POSITIVE — AB
Fecal Occult Bld: POSITIVE — AB

## 2015-08-23 NOTE — Progress Notes (Signed)
CC:  I have pain of my right knee. I would like an injection.  The patient has chronic pain of the right knee.  There is no recent trauma.  There is no redness.  Injections in the past have helped.  The knee has no redness, has an effusion and crepitus present.  ROM of the knee is 0-100.  Impression:  Chronic knee pain right.  Return: as needed.  PROCEDURE NOTE:  The patient requests injections of the right knee , verbal consent was obtained.  The right knee was prepped appropriately after time out was performed.   Sterile technique was observed and injection of 1 cc of Depo-Medrol 40 mg with several cc's of plain xylocaine. Anesthesia was provided by ethyl chloride and a 20-gauge needle was used to inject the knee area. The injection was tolerated well.  A band aid dressing was applied.  The patient was advised to apply ice later today and tomorrow to the injection sight as needed.

## 2015-08-23 NOTE — Addendum Note (Signed)
Addended by: Berneta Levins on: 08/23/2015 01:26 PM   Modules accepted: Orders

## 2015-08-25 ENCOUNTER — Other Ambulatory Visit: Payer: Self-pay | Admitting: Family Medicine

## 2015-08-28 ENCOUNTER — Other Ambulatory Visit: Payer: Self-pay

## 2015-08-28 ENCOUNTER — Other Ambulatory Visit (HOSPITAL_COMMUNITY): Payer: Self-pay | Admitting: Emergency Medicine

## 2015-08-28 DIAGNOSIS — C259 Malignant neoplasm of pancreas, unspecified: Secondary | ICD-10-CM

## 2015-08-28 DIAGNOSIS — C787 Secondary malignant neoplasm of liver and intrahepatic bile duct: Principal | ICD-10-CM

## 2015-08-28 MED ORDER — PANTOPRAZOLE SODIUM 20 MG PO TBEC: DELAYED_RELEASE_TABLET | ORAL | Status: DC

## 2015-08-28 MED ORDER — CYCLOBENZAPRINE HCL 10 MG PO TABS: 10.0000 mg | ORAL_TABLET | Freq: Two times a day (BID) | ORAL | Status: DC | PRN

## 2015-08-30 ENCOUNTER — Ambulatory Visit (INDEPENDENT_AMBULATORY_CARE_PROVIDER_SITE_OTHER): Payer: Medicare Other | Admitting: Otolaryngology

## 2015-08-30 DIAGNOSIS — H9012 Conductive hearing loss, unilateral, left ear, with unrestricted hearing on the contralateral side: Secondary | ICD-10-CM

## 2015-08-30 DIAGNOSIS — H9209 Otalgia, unspecified ear: Secondary | ICD-10-CM | POA: Diagnosis not present

## 2015-08-30 DIAGNOSIS — H6122 Impacted cerumen, left ear: Secondary | ICD-10-CM

## 2015-09-04 ENCOUNTER — Encounter: Payer: Self-pay | Admitting: *Deleted

## 2015-09-04 NOTE — Progress Notes (Signed)
[facility] Clinical Social Work  Clinical Social Work phoned pt to set up appt to complete ADRs. Pt already has packet and denies having questions about it. Pt has her son coming into town on 5/13 and would like to complete on 5/16.  Pt will reach out to CSW to arrange a time.    Clinical Social Work interventions: ADR education  Loren Racer, [place] Tuesdays   [phone]

## 2015-09-05 ENCOUNTER — Other Ambulatory Visit (HOSPITAL_COMMUNITY): Payer: Self-pay | Admitting: Oncology

## 2015-09-05 ENCOUNTER — Encounter (HOSPITAL_BASED_OUTPATIENT_CLINIC_OR_DEPARTMENT_OTHER): Payer: Medicare Other

## 2015-09-05 VITALS — BP 155/57 | HR 57 | Temp 97.8°F | Resp 18 | Wt 145.2 lb

## 2015-09-05 DIAGNOSIS — D500 Iron deficiency anemia secondary to blood loss (chronic): Secondary | ICD-10-CM

## 2015-09-05 DIAGNOSIS — C801 Malignant (primary) neoplasm, unspecified: Secondary | ICD-10-CM | POA: Diagnosis not present

## 2015-09-05 DIAGNOSIS — C787 Secondary malignant neoplasm of liver and intrahepatic bile duct: Secondary | ICD-10-CM

## 2015-09-05 DIAGNOSIS — Z5111 Encounter for antineoplastic chemotherapy: Secondary | ICD-10-CM

## 2015-09-05 DIAGNOSIS — C259 Malignant neoplasm of pancreas, unspecified: Secondary | ICD-10-CM | POA: Diagnosis not present

## 2015-09-05 DIAGNOSIS — C252 Malignant neoplasm of tail of pancreas: Secondary | ICD-10-CM | POA: Diagnosis not present

## 2015-09-05 LAB — COMPREHENSIVE METABOLIC PANEL
ALBUMIN: 3.8 g/dL (ref 3.5–5.0)
ALT: 15 U/L (ref 14–54)
ANION GAP: 9 (ref 5–15)
AST: 17 U/L (ref 15–41)
Alkaline Phosphatase: 72 U/L (ref 38–126)
BILIRUBIN TOTAL: 0.6 mg/dL (ref 0.3–1.2)
BUN: 20 mg/dL (ref 6–20)
CHLORIDE: 105 mmol/L (ref 101–111)
CO2: 26 mmol/L (ref 22–32)
Calcium: 9 mg/dL (ref 8.9–10.3)
Creatinine, Ser: 0.98 mg/dL (ref 0.44–1.00)
GFR calc Af Amer: 60 mL/min — ABNORMAL LOW (ref >60)
GFR calc non Af Amer: 52 mL/min — ABNORMAL LOW (ref >60)
GLUCOSE: 99 mg/dL (ref 65–99)
POTASSIUM: 4.2 mmol/L (ref 3.5–5.1)
SODIUM: 140 mmol/L (ref 135–145)
Total Protein: 6.7 g/dL (ref 6.5–8.1)

## 2015-09-05 LAB — CBC WITH DIFFERENTIAL/PLATELET
BASOS ABS: 0 10*3/uL (ref 0.0–0.1)
BASOS PCT: 0 %
EOS ABS: 0.1 10*3/uL (ref 0.0–0.7)
Eosinophils Relative: 1 %
HEMATOCRIT: 31 % — AB (ref 36.0–46.0)
Hemoglobin: 10.2 g/dL — ABNORMAL LOW (ref 12.0–15.0)
LYMPHS PCT: 17 %
Lymphs Abs: 1.2 10*3/uL (ref 0.7–4.0)
MCH: 29 pg (ref 26.0–34.0)
MCHC: 32.9 g/dL (ref 30.0–36.0)
MCV: 88.1 fL (ref 78.0–100.0)
MONO ABS: 0.8 10*3/uL (ref 0.1–1.0)
Monocytes Relative: 12 %
NEUTROS ABS: 4.8 10*3/uL (ref 1.7–7.7)
Neutrophils Relative %: 69 %
PLATELETS: 265 10*3/uL (ref 150–400)
RBC: 3.52 MIL/uL — ABNORMAL LOW (ref 3.87–5.11)
RDW: 21.1 % — AB (ref 11.5–15.5)
WBC: 6.9 10*3/uL (ref 4.0–10.5)

## 2015-09-05 MED ORDER — SODIUM CHLORIDE 0.9 % IJ SOLN: 10.0000 mL | INTRAMUSCULAR | Status: DC | PRN

## 2015-09-05 MED ORDER — SODIUM CHLORIDE 0.9 % IV SOLN
Freq: Once | INTRAVENOUS | Status: AC
  Administered 2015-09-05: 13:00:00 via INTRAVENOUS

## 2015-09-05 MED ORDER — HEPARIN SOD (PORK) LOCK FLUSH 100 UNIT/ML IV SOLN
500.0000 [IU] | Freq: Once | INTRAVENOUS | Status: AC
  Administered 2015-09-05: 500 [IU] via INTRAVENOUS

## 2015-09-05 MED ORDER — SODIUM CHLORIDE 0.9 % IV SOLN
1000.0000 mg/m2 | Freq: Once | INTRAVENOUS | Status: AC
  Administered 2015-09-05: 1786 mg via INTRAVENOUS
  Filled 2015-09-05: qty 46.97

## 2015-09-05 MED ORDER — HEPARIN SOD (PORK) LOCK FLUSH 100 UNIT/ML IV SOLN: 500.0000 [IU] | Freq: Once | INTRAVENOUS | Status: AC | PRN

## 2015-09-05 MED ORDER — PACLITAXEL PROTEIN-BOUND CHEMO INJECTION 100 MG
125.0000 mg/m2 | Freq: Once | INTRAVENOUS | Status: AC
  Administered 2015-09-05: 225 mg via INTRAVENOUS
  Filled 2015-09-05: qty 45

## 2015-09-05 MED ORDER — HEPARIN SOD (PORK) LOCK FLUSH 100 UNIT/ML IV SOLN
INTRAVENOUS | Status: AC
  Filled 2015-09-05: qty 5

## 2015-09-05 MED ORDER — SODIUM CHLORIDE 0.9 % IV SOLN
Freq: Once | INTRAVENOUS | Status: AC
  Administered 2015-09-05: 8 mg via INTRAVENOUS
  Filled 2015-09-05: qty 4

## 2015-09-05 NOTE — Progress Notes (Signed)
Patient tolerated infusion well.  VSS.   

## 2015-09-05 NOTE — Patient Instructions (Signed)
[facility] Discharge Instructions for Patients Receiving Chemotherapy   Beginning January 23rd 2017 lab work for the Cancer Center will be done in the  Main lab at  on 1st floor. If you have a lab appointment with the Cancer Center please come in thru the  Main Entrance and check in at the main information desk   Today you received the following chemotherapy agents:  Abraxane and Gemzar.  If you develop nausea and vomiting, or diarrhea that is not controlled by your medication, call the clinic.  The clinic phone number is [phone]. Office hours are Monday-Friday 8:30am-5:00pm.  BELOW ARE SYMPTOMS THAT SHOULD BE REPORTED IMMEDIATELY:  *FEVER GREATER THAN 101.0 F  *CHILLS WITH OR WITHOUT FEVER  NAUSEA AND VOMITING THAT IS NOT CONTROLLED WITH YOUR NAUSEA MEDICATION  *UNUSUAL SHORTNESS OF BREATH  *UNUSUAL BRUISING OR BLEEDING  TENDERNESS IN MOUTH AND THROAT WITH OR WITHOUT PRESENCE OF ULCERS  *URINARY PROBLEMS  *BOWEL PROBLEMS  UNUSUAL RASH Items with * indicate a potential emergency and should be followed up as soon as possible. If you have an emergency after office hours please contact your primary care physician or go to the nearest emergency department.  Please call the clinic during office hours if you have any questions or concerns.   You may also contact the Patient Navigator at [phone] should you have any questions or need assistance in obtaining follow up care.      Resources For Cancer Patients and their Caregivers ? American Cancer Society: Can assist with transportation, wigs, general needs, runs Look Good Feel Better.        1-888-227-6333 ? Cancer Care: Provides financial assistance, online support groups, medication/co-pay assistance.  1-800-813-HOPE (4673) ? [facility] Assists [place] cancer patients and their families through emotional , educational and financial support.   336-427-4357 ? [place] DSS Where to apply for food stamps, Medicaid and utility assistance. 336-342-1394 ? [employer]: Transportation to medical appointments. 336-347-2287 ? Social Security Administration: May apply for disability if have a Stage IV cancer. 336-342-7796 1-800-772-1213 ? [place] [employer], Disability and Transit Services: Assists with nutrition, care and transit needs. 336-349-2343         

## 2015-09-06 LAB — CANCER ANTIGEN 19-9: CA 19 9: 14 U/mL (ref 0–35)

## 2015-09-10 ENCOUNTER — Other Ambulatory Visit (HOSPITAL_COMMUNITY): Payer: Self-pay | Admitting: Hematology & Oncology

## 2015-09-12 ENCOUNTER — Encounter (HOSPITAL_BASED_OUTPATIENT_CLINIC_OR_DEPARTMENT_OTHER): Payer: Medicare Other | Admitting: Hematology & Oncology

## 2015-09-12 ENCOUNTER — Encounter (HOSPITAL_COMMUNITY): Payer: Self-pay | Admitting: Oncology

## 2015-09-12 ENCOUNTER — Encounter (HOSPITAL_COMMUNITY): Payer: Self-pay | Admitting: Hematology & Oncology

## 2015-09-12 ENCOUNTER — Other Ambulatory Visit (HOSPITAL_COMMUNITY): Payer: Self-pay | Admitting: Oncology

## 2015-09-12 ENCOUNTER — Encounter (HOSPITAL_COMMUNITY): Payer: Medicare Other | Attending: Hematology and Oncology

## 2015-09-12 ENCOUNTER — Encounter: Payer: Medicare Other | Admitting: Family Medicine

## 2015-09-12 VITALS — BP 145/73 | HR 67 | Temp 98.0°F | Resp 18 | Wt 144.0 lb

## 2015-09-12 DIAGNOSIS — C787 Secondary malignant neoplasm of liver and intrahepatic bile duct: Secondary | ICD-10-CM

## 2015-09-12 DIAGNOSIS — D6481 Anemia due to antineoplastic chemotherapy: Secondary | ICD-10-CM

## 2015-09-12 DIAGNOSIS — C259 Malignant neoplasm of pancreas, unspecified: Secondary | ICD-10-CM | POA: Insufficient documentation

## 2015-09-12 DIAGNOSIS — C801 Malignant (primary) neoplasm, unspecified: Secondary | ICD-10-CM | POA: Diagnosis not present

## 2015-09-12 DIAGNOSIS — T451X5A Adverse effect of antineoplastic and immunosuppressive drugs, initial encounter: Secondary | ICD-10-CM

## 2015-09-12 DIAGNOSIS — D649 Anemia, unspecified: Secondary | ICD-10-CM | POA: Diagnosis not present

## 2015-09-12 DIAGNOSIS — Z5111 Encounter for antineoplastic chemotherapy: Secondary | ICD-10-CM

## 2015-09-12 DIAGNOSIS — C252 Malignant neoplasm of tail of pancreas: Secondary | ICD-10-CM | POA: Insufficient documentation

## 2015-09-12 HISTORY — DX: Adverse effect of antineoplastic and immunosuppressive drugs, initial encounter: D64.81

## 2015-09-12 LAB — COMPREHENSIVE METABOLIC PANEL
ALK PHOS: 73 U/L (ref 38–126)
ALT: 17 U/L (ref 14–54)
AST: 19 U/L (ref 15–41)
Albumin: 3.8 g/dL (ref 3.5–5.0)
Anion gap: 8 (ref 5–15)
BILIRUBIN TOTAL: 0.6 mg/dL (ref 0.3–1.2)
BUN: 17 mg/dL (ref 6–20)
CALCIUM: 8.7 mg/dL — AB (ref 8.9–10.3)
CO2: 27 mmol/L (ref 22–32)
CREATININE: 1.26 mg/dL — AB (ref 0.44–1.00)
Chloride: 107 mmol/L (ref 101–111)
GFR calc Af Amer: 44 mL/min — ABNORMAL LOW (ref >60)
GFR, EST NON AFRICAN AMERICAN: 38 mL/min — AB (ref >60)
Glucose, Bld: 137 mg/dL — ABNORMAL HIGH (ref 65–99)
Potassium: 4.4 mmol/L (ref 3.5–5.1)
Sodium: 142 mmol/L (ref 135–145)
Total Protein: 6.6 g/dL (ref 6.5–8.1)

## 2015-09-12 LAB — CBC WITH DIFFERENTIAL/PLATELET
BASOS ABS: 0 10*3/uL (ref 0.0–0.1)
Basophils Relative: 0 %
Eosinophils Absolute: 0 10*3/uL (ref 0.0–0.7)
Eosinophils Relative: 0 %
HEMATOCRIT: 28.2 % — AB (ref 36.0–46.0)
Hemoglobin: 9.1 g/dL — ABNORMAL LOW (ref 12.0–15.0)
LYMPHS PCT: 20 %
Lymphs Abs: 0.5 10*3/uL — ABNORMAL LOW (ref 0.7–4.0)
MCH: 28.3 pg (ref 26.0–34.0)
MCHC: 32.3 g/dL (ref 30.0–36.0)
MCV: 87.9 fL (ref 78.0–100.0)
MONO ABS: 0 10*3/uL — AB (ref 0.1–1.0)
Monocytes Relative: 1 %
NEUTROS ABS: 2.1 10*3/uL (ref 1.7–7.7)
Neutrophils Relative %: 79 %
Platelets: 107 10*3/uL — ABNORMAL LOW (ref 150–400)
RBC: 3.21 MIL/uL — AB (ref 3.87–5.11)
RDW: 19.6 % — ABNORMAL HIGH (ref 11.5–15.5)
WBC: 2.7 10*3/uL — AB (ref 4.0–10.5)

## 2015-09-12 MED ORDER — SODIUM CHLORIDE 0.9 % IV SOLN
1000.0000 mg/m2 | Freq: Once | INTRAVENOUS | Status: AC
  Administered 2015-09-12: 1786 mg via INTRAVENOUS
  Filled 2015-09-12: qty 46.97

## 2015-09-12 MED ORDER — DARBEPOETIN ALFA 500 MCG/ML IJ SOSY
PREFILLED_SYRINGE | INTRAMUSCULAR | Status: AC
  Filled 2015-09-12: qty 1

## 2015-09-12 MED ORDER — SODIUM CHLORIDE 0.9 % IV SOLN
Freq: Once | INTRAVENOUS | Status: AC
  Administered 2015-09-12: 8 mg via INTRAVENOUS
  Filled 2015-09-12: qty 4

## 2015-09-12 MED ORDER — DARBEPOETIN ALFA 500 MCG/ML IJ SOSY
500.0000 ug | PREFILLED_SYRINGE | Freq: Once | INTRAMUSCULAR | Status: AC
  Administered 2015-09-12: 500 ug via SUBCUTANEOUS

## 2015-09-12 MED ORDER — PACLITAXEL PROTEIN-BOUND CHEMO INJECTION 100 MG
125.0000 mg/m2 | Freq: Once | INTRAVENOUS | Status: AC
  Administered 2015-09-12: 225 mg via INTRAVENOUS
  Filled 2015-09-12: qty 45

## 2015-09-12 MED ORDER — HEPARIN SOD (PORK) LOCK FLUSH 100 UNIT/ML IV SOLN
500.0000 [IU] | Freq: Once | INTRAVENOUS | Status: AC
  Administered 2015-09-12: 500 [IU] via INTRAVENOUS

## 2015-09-12 MED ORDER — HEPARIN SOD (PORK) LOCK FLUSH 100 UNIT/ML IV SOLN: 500.0000 [IU] | Freq: Once | INTRAVENOUS | Status: AC | PRN

## 2015-09-12 MED ORDER — SODIUM CHLORIDE 0.9 % IJ SOLN: 10.0000 mL | INTRAMUSCULAR | Status: DC | PRN

## 2015-09-12 MED ORDER — SODIUM CHLORIDE 0.9 % IV SOLN
Freq: Once | INTRAVENOUS | Status: AC
  Administered 2015-09-12: 10:00:00 via INTRAVENOUS

## 2015-09-12 NOTE — Progress Notes (Signed)
[name], MD [address] [place] [address]   DIAGNOSIS: Pancreatic cancer metastasized to liver   Staging form: Pancreas, AJCC 7th Edition     Clinical: Stage IV (T3, N1, M1) - Signed by Baird Cancer, PA-C on 03/16/2014     Pathologic: No stage assigned - Unsigned  CONTRAST MEDIA ALLERGY  SUMMARY OF ONCOLOGIC HISTORY:   Pancreatic cancer metastasized to liver ([place])   03/10/2014 Initial Diagnosis Pancreatic cancer metastasized to liver   03/22/2014 -  Chemotherapy Abraxane/Gemzar days 1, 8, every 28 days.  Day 15 was cancelled due to leukopenia and thrombocytopenia on day 15 cycle 1.   05/24/2014 Treatment Plan Change Day 8 of cycle 3 is held with ANC of 1.1   06/05/2014 Imaging CT C/A/P . Interval decrease in size of the pancreatic tail mass.Improved hepatic metastatic disease. No new lesions. No CT findings for metastatic disease involving the chest.   08/09/2014 Tumor Marker CA 19-9= 33 (WNL)   10/26/2014 Imaging MRI- Continued interval decrease in size of the hepatic metastatic lesions and no new lesions are identified. Continued decrease in size of the pancreatic tail lesion.   01/03/2015 Tumor Marker Results for ANGELEAH, LABRAKE (MRN [id number]) as of 01/18/2015 12:52  01/03/2015 10:00 CA 19-9: 14    01/17/2015 Imaging MRI- Response to therapy of hepatic metastasis.  Similar size of a pancreatic tail lesion.   03/20/2015 Imaging MRI-L spine- Severe disc space narrowing at L2-L3, with endplate reactive changes. Large disc extrusion into the ventral epidural space,central to the RIGHT with a cephalad migrated free fragment. Additional Large disc extrusion into the retroperitone...   03/22/2015 Imaging CT pelvis- No evidence for metastatic disease within the pelvis.   07/24/2015 Imaging MRI abd- Continued response to therapy, with no residual detectable liver metastases. No new sites of metastatic disease in the abdomen.    08/15/2015 Code Status  She confirms desire  for DNR status.    CURRENT THERAPY: Gemzar/Abraxane  INTERVAL HISTORY: November Sypher 82 y.o. female returns for follow-up of her pancreatic cancer. She is doing fairly well. She notes that she attends church weekly. She does have fatigue.   Ms. Crowell is alone and here for Cycle #20 Abraxane / Gemcitabine.   She is feeling well, "except my knee hurts". She received a shot to her knee which helped initially, though subsequent shots did not help as much. She is not able to receive another shot until May 18th.  Her taste buds "are gone". She has lost weight attributing this to the taste change. She has completely stopped smoking. Believes she is eating better since discontinuing smoking. She drinks about 3 milkshakes a week but does not drink Ensure or Boost. Her energy has improved, with taking "B caps". Every morning she gets up and dressed, going out some. She is not concerned about anything except the pain in her knee. Reports cough for over a month that is improving. Denies sputum production. Denies fever. Denies abdominal pain.    MEDICAL HISTORY: Past Medical History  Diagnosis Date  . Hypertension 20 years   . Chronic diarrhea   . Anxiety   . Depression   . Kidney stone     hx/ crushed   . Hx of adenomatous colonic polyps     tubular adenomas, last found in 2008  . GERD (gastroesophageal reflux disease)   . Erosive esophagitis   . Schatzki's ring 08/26/10    Last dilated on EGD by Dr.  Rourk->small HH, linear gastric erosions, BI hemigastrectomy  . Hyperplastic colon polyp 03/19/10    tcs by Dr. Gala Romney  . Pancreatic cancer ([place]) 02/2014  . Pancreatic cancer metastasized to liver ([place]) 03/10/2014  . Opioid contract exists 04/18/2015    With Dr. Moshe Cipro  . DNR (do not resuscitate) 08/16/2015    has Essential hypertension; GERD (gastroesophageal reflux disease); Anxiety; Primary generalized (osteo)arthritis; Back pain with radiation; IGT (impaired glucose tolerance); Anemia, iron  deficiency; Elevated LFTs; Abnormal liver ultrasound; Pancreatic cancer metastasized to liver [facility]); Allergic cough; Allergic rhinitis; Cough; Heme positive stool; Knee pain, right; Opioid contract exists; Hx of adenomatous colonic polyps; Chronic low back pain with sciatica; and DNR (do not resuscitate) on her problem list.     is allergic to iohexol; aciphex; amlodipine besylate-valsartan; esomeprazole magnesium; omeprazole; penicillins; and ciprofloxacin.  Ms. Sklar does not currently have medications on file.  SURGICAL HISTORY: Past Surgical History  Procedure Laterality Date  . Stomach ulcer  50 years ago     had some of her stomach removed   . Bunion removal      from both feet   . Left shoulder surgery  2009    DR HARRISON  . Colonoscopy  03/19/2010    DR Gala Romney,, normal TI, pancolonic diverticula, random colon bx neg., hyperplastic polyps removed  . Esophagogastroduodenoscopy  11/22/2003    DR Gala Romney, erosive RE, Billroth I  . Esophagogastroduodenoscopy  08/26/10    Dr. Gala Romney- moderate severe ERE, Scahtzki ring s/p dilation, Billroth I, linear gastric erosions, bx-gastric xanthelasma  . Breast lumpectomy Left   . Orif ankle fracture Right 05/22/2013    Procedure: OPEN REDUCTION INTERNAL FIXATION (ORIF) RIGHT ANKLE FRACTURE;  Surgeon: Sanjuana Kava, MD;  Location: [facility];  Service: Orthopedics;  Laterality: Right;  . Cholecystectomy  1965     SOCIAL HISTORY: Social History   Social History  . Marital Status: Widowed    Spouse Name: N/A  . Number of Children: 2  . Years of Education: N/A   Occupational History  . retired from Teacher, adult education    Social History Main Topics  . Smoking status: Former Smoker    Types: Cigarettes    Quit date: 11/09/2011  . Smokeless tobacco: Never Used     Comment: quit a few weeks ago   . Alcohol Use: No  . Drug Use: No  . Sexual Activity: No   Other Topics Concern  . Not on file   Social History Narrative    FAMILY HISTORY: Family History   Problem Relation Age of Onset  . Hypertension Mother   . Kidney failure Brother     X1 ON DIALYSIS  . Diabetes Sister   . Cancer Sister     X2  . Hypertension Father   . Liver disease Neg Hx   . Colon cancer Neg Hx     Review of Systems  Constitutional: Negative for malaise/fatigue. HENT: Positive for cough and taste change. Cough for about one month, improving. Eyes: Positive for dry eyes. Negative for vision change, double vision, photophobia, pain, discharge and redness. Dry eyes managed with eye drops.  Respiratory: Negative.   Cardiovascular: Negative.   Gastrointestinal: Negative.   Genitourinary: Negative.  Musculoskeletal: Positive for joint pain and back pain.  Knee pain. Skin: Negative.   Neurological: Negative for weakness, dizziness, tingling, tremors, sensory change, speech change, focal weakness, seizures and loss of consciousness. Endo/Heme/Allergies: Negative.   Psychiatric/Behavioral: Negative.   14 point review of systems was performed and  is negative except as detailed under history of present illness and above   PHYSICAL EXAMINATION  ECOG PERFORMANCE STATUS: 1 - Symptomatic but completely ambulatory   Vitals with BMI 09/12/2015  Height   Weight 144 lbs  BMI   Systolic 269  Diastolic 64  Pulse 62  Respirations 18    Physical Exam  Constitutional: She is oriented to person, place, and time and well-developed, well-nourished, and in no distress.  Well groomed. In treatment chair. HENT: Negative Head: Normocephalic and atraumatic.  Nose: Nose normal.  Mouth/Throat: Oropharynx is clear and moist. No oropharyngeal exudate.  Eyes: Conjunctivae and EOM are normal. Pupils are equal, round, and reactive to light. Right eye exhibits no discharge. Left eye exhibits no discharge. No scleral icterus.  Neck: Normal range of motion. Neck supple. No tracheal deviation present. No thyromegaly present.  Cardiovascular: Normal rate, regular rhythm and normal  heart sounds.  Exam reveals no gallop and no friction rub.   No murmur heard. Pulmonary/Chest: Effort normal and breath sounds normal. She has no wheezes. She has no rales.  Abdominal: Soft. Bowel sounds are normal. She exhibits no distension and no mass. There is no tenderness. There is no rebound and no guarding.  Musculoskeletal: Normal range of motion. She exhibits no edema.  Lymphadenopathy:    She has no cervical adenopathy.  Neurological: She is alert and oriented to person, place, and time. She has normal reflexes. No cranial nerve deficit. Gait normal. Coordination normal. Gait slow Skin: Skin is warm and dry. No rash noted.  Psychiatric: Mood, memory, affect and judgment normal.  Nursing note and vitals reviewed.   LABORATORY DATA: I have reviewed the data as listed. Results for LAKISA, LOTZ (MRN [id number]) as of 09/12/2015 17:13  Ref. Range 09/12/2015 09:35  Sodium Latest Ref Range: 135-145 mmol/L 142  Potassium Latest Ref Range: 3.5-5.1 mmol/L 4.4  Chloride Latest Ref Range: 101-111 mmol/L 107  CO2 Latest Ref Range: 22-32 mmol/L 27  BUN Latest Ref Range: 6-20 mg/dL 17  Creatinine Latest Ref Range: 0.44-1.00 mg/dL 1.26 (H)  Calcium Latest Ref Range: 8.9-10.3 mg/dL 8.7 (L)  EGFR (Non-African Amer.) Latest Ref Range: >60 mL/min 38 (L)  EGFR (African American) Latest Ref Range: >60 mL/min 44 (L)  Glucose Latest Ref Range: 65-99 mg/dL 137 (H)  Anion gap Latest Ref Range: 5-15  8  Alkaline Phosphatase Latest Ref Range: 38-126 U/L 73  Albumin Latest Ref Range: 3.5-5.0 g/dL 3.8  AST Latest Ref Range: 15-41 U/L 19  ALT Latest Ref Range: 14-54 U/L 17  Total Protein Latest Ref Range: 6.5-8.1 g/dL 6.6  Total Bilirubin Latest Ref Range: 0.3-1.2 mg/dL 0.6  WBC Latest Ref Range: 4.0-10.5 K/uL 2.7 (L)  RBC Latest Ref Range: 3.87-5.11 MIL/uL 3.21 (L)  Hemoglobin Latest Ref Range: 12.0-15.0 g/dL 9.1 (L)  HCT Latest Ref Range: 36.0-46.0 % 28.2 (L)  MCV Latest Ref Range: 78.0-100.0 fL  87.9  MCH Latest Ref Range: 26.0-34.0 pg 28.3  MCHC Latest Ref Range: 30.0-36.0 g/dL 32.3  RDW Latest Ref Range: 11.5-15.5 % 19.6 (H)  Platelets Latest Ref Range: 150-400 K/uL 107 (L)  Neutrophils Latest Units: % 79  Lymphocytes Latest Units: % 20  Monocytes Relative Latest Units: % 1  Eosinophil Latest Units: % 0  Basophil Latest Units: % 0  NEUT# Latest Ref Range: 1.7-7.7 K/uL 2.1  Lymphocyte # Latest Ref Range: 0.7-4.0 K/uL 0.5 (L)  Monocyte # Latest Ref Range: 0.1-1.0 K/uL 0.0 (L)  Eosinophils Absolute Latest Ref Range: 0.0-0.7 K/uL  0.0  Basophils Absolute Latest Ref Range: 0.0-0.1 K/uL 0.0      RADIOLOGY: I have personally reviewed the radiological images as listed and agreed with the findings in the report.  Study Result     CLINICAL DATA: Stage IV pancreatic tail cancer presenting for restaging status post therapy.  EXAM: MRI ABDOMEN WITHOUT AND WITH CONTRAST  TECHNIQUE: Multiplanar multisequence MR imaging of the abdomen was performed both before and after the administration of intravenous contrast.  CONTRAST: 15m MULTIHANCE GADOBENATE DIMEGLUMINE 529 MG/ML IV SOLN  COMPARISON: 01/17/2015 MRI abdomen.  FINDINGS: Motion degraded study.  Lower chest: Top-normal heart size.  Hepatobiliary: Normal liver size and configuration. No hepatic steatosis. No residual liver masses. The previously described 0.6 cm subcapsular T2 hyperintense segment 8 right liver lobe lesion has resolved. The previously described 0.9 cm segment 7 right liver lobe lesion has resolved. Mild heterogeneity within the periphery of the right liver lobe on the arterial phase sequence is not appreciably changed and is occult on all other sequences, in keeping with transient perfusional phenomena. Cholecystectomy. No intrahepatic biliary ductal dilatation. Common bile duct diameter 8 mm, within expected post cholecystectomy limits. No evidence of choledocholithiasis.  Pancreas:  There is a heterogeneously enhancing and heterogeneous T2 signal 2.0 x 1.4 cm pancreatic tail mass (series 11/ image 27), previously 2.2 x 1.7 cm on 01/17/2015 using similar measurement technique, mildly decreased in size. There is stable top-normal caliber of the main pancreatic duct. No new pancreatic masses. No pancreas divisum.  Spleen: Normal size. No mass.  Adrenals/Urinary Tract: Normal adrenals. No hydronephrosis. There are several small renal cysts in both kidneys that demonstrate precontrast T1 hyperintensity, without obvious enhancement on the limited motion degraded postcontrast images, largest 2.1 cm in the posterior right kidney, not definitely changed, favor Bosniak category 2 hemorrhagic/ proteinaceous renal cysts. There are numerous additional small simple appearing renal cysts in both kidneys.  Stomach/Bowel: Grossly normal stomach. Visualized small and large bowel is normal caliber, with no bowel wall thickening. Scattered diverticula are seen throughout the visualized colon.  Vascular/Lymphatic: Atherosclerotic nonaneurysmal abdominal aorta. Patent portal, splenic, hepatic and renal veins. No pathologically enlarged lymph nodes in the abdomen.  Other: No abdominal ascites or focal fluid collection.  Musculoskeletal: No aggressive appearing focal osseous lesions.  IMPRESSION: 1. Significantly motion degraded study. Consider future imaging follow-up with CT given the patient's limited ability to breath hold. 2. Continued response to therapy, with no residual detectable liver metastases. No new sites of metastatic disease in the abdomen. 3. Mildly decreased size of the primary pancreatic tail malignancy. 4. Probably benign Bosniak category 1 and category 2 renal cysts in both kidneys, difficult to characterize given motion degradation.   Electronically Signed  By: JIlona SorrelM.D.  On: 07/24/2015 15:33    ASSESSMENT and THERAPY PLAN:    STAGE IV Pancreatic Cancer Ongoing response to therapy Mild chemotherapy induced neuropathy Anemia Hemoccult positive stool Thrombocytopenia  82year old female on Gemzar/Abraxane for stage IV pancreatic cancer. She is doing and has done remarkably well.  She would like to continue with therapy. Her performance status remains a 1. We have discussed taking a break from treatment if she becomes too fatigued but she remains fairly active and feels her QOL is quite good.   DNR status and living will have been addressed by the patient and her son.  The patient is here for Cycle #20 Abraxane / Gemcitabine. I have recommended that we consider the addition of aranesp which may  help with her anemia. I advised her that if her counts improve but her energy does not we can discontinue the medication. We discussed risks/benefits of aranesp therapy. The [facility] oncology program acknowledgement form was reviewed with the patient.   She has discontinued smoking.   The patient continues to follow with orthopedics for knee pain.  I will see her again at the start of her next cycle.  All questions were answered. The patient knows to call the clinic with any problems, questions or concerns. We can certainly see the patient much sooner if necessary.  This document serves as a record of services personally performed by Ancil Linsey, MD. It was created on her behalf by Arlyce Harman, a trained medical scribe. The creation of this record is based on the scribe's personal observations and the provider's statements to them. This document has been checked and approved by the attending provider.  I have reviewed the above documentation for accuracy and completeness, and I agree with the above.  This note was signed electronically. Molli Hazard, MD  09/12/2015

## 2015-09-12 NOTE — Progress Notes (Signed)
Renal function discussed with MD.  Orders for an additional 500cc Normal Saline IV obtained and given as ordered.    Patient tolerated infusion well.  VSS.

## 2015-09-12 NOTE — Patient Instructions (Addendum)
[place] at [facility] Discharge Instructions  RECOMMENDATIONS MADE BY THE CONSULTANT AND ANY TEST RESULTS WILL BE SENT TO YOUR REFERRING PHYSICIAN.   Exam and discussion by Dr Whitney Muse today Approval for aranesp for chemo induced anemia, you will get this injection every 3 weeks  Return to see the doctor at the beginning of your next cycle Please call the clinic if you have any questions or concerns     Thank you for choosing [place] at [facility] to provide your oncology and hematology care.  To afford each patient quality time with our provider, please arrive at least 15 minutes before your scheduled appointment time.   Beginning January 23rd 2017 lab work for the [name] will be done in the  Main lab at [facility] on 1st floor. If you have a lab appointment with the [facility] please come in thru the  Main Entrance and check in at the main information desk  You need to re-schedule your appointment should you arrive 10 or more minutes late.  We strive to give you quality time with our providers, and arriving late affects you and other patients whose appointments are after yours.  Also, if you no show three or more times for appointments you may be dismissed from the clinic at the providers discretion.     Again, thank you for choosing [facility].  Our hope is that these requests will decrease the amount of time that you wait before being seen by our physicians.       _____________________________________________________________  Should you have questions after your visit to [facility], please contact our office at [phone] between the hours of 8:30 a.m. and 4:30 p.m.  Voicemails left after 4:30 p.m. will not be returned until the following business day.  For prescription refill requests, have your pharmacy contact our office.         Resources For Cancer Patients and their  Caregivers ? American Cancer Society: Can assist with transportation, wigs, general needs, runs Look Good Feel Better.        276 184 3890 ? Cancer Care: Provides financial assistance, online support groups, medication/co-pay assistance.  1-800-813-HOPE 941-171-6853) ? [facility] Assists [place] cancer patients and their families through emotional , educational and financial support.  786-545-2460 ? [place] DSS Where to apply for food stamps, Medicaid and utility assistance. 450-368-8703 ? [facility]: Transportation to medical appointments. 7472227938 ? Social Security Administration: May apply for disability if have a Stage IV cancer. 315-429-5686 (629) 291-5482 ? [employer], Disability and Transit Services: Assists with nutrition, care and transit needs. 417-336-5155

## 2015-09-12 NOTE — Patient Instructions (Signed)
[facility] Discharge Instructions for Patients Receiving Chemotherapy   Beginning January 23rd 2017 lab work for the Cancer Center will be done in the  Main lab at  on 1st floor. If you have a lab appointment with the Cancer Center please come in thru the  Main Entrance and check in at the main information desk   Today you received the following chemotherapy agents:  Abraxane and Gemzar.  If you develop nausea and vomiting, or diarrhea that is not controlled by your medication, call the clinic.  The clinic phone number is [phone]. Office hours are Monday-Friday 8:30am-5:00pm.  BELOW ARE SYMPTOMS THAT SHOULD BE REPORTED IMMEDIATELY:  *FEVER GREATER THAN 101.0 F  *CHILLS WITH OR WITHOUT FEVER  NAUSEA AND VOMITING THAT IS NOT CONTROLLED WITH YOUR NAUSEA MEDICATION  *UNUSUAL SHORTNESS OF BREATH  *UNUSUAL BRUISING OR BLEEDING  TENDERNESS IN MOUTH AND THROAT WITH OR WITHOUT PRESENCE OF ULCERS  *URINARY PROBLEMS  *BOWEL PROBLEMS  UNUSUAL RASH Items with * indicate a potential emergency and should be followed up as soon as possible. If you have an emergency after office hours please contact your primary care physician or go to the nearest emergency department.  Please call the clinic during office hours if you have any questions or concerns.   You may also contact the Patient Navigator at [phone] should you have any questions or need assistance in obtaining follow up care.      Resources For Cancer Patients and their Caregivers ? American Cancer Society: Can assist with transportation, wigs, general needs, runs Look Good Feel Better.        1-888-227-6333 ? Cancer Care: Provides financial assistance, online support groups, medication/co-pay assistance.  1-800-813-HOPE (4673) ? [facility] Assists [place] cancer patients and their families through emotional , educational and financial support.   336-427-4357 ? [place] DSS Where to apply for food stamps, Medicaid and utility assistance. 336-342-1394 ? [facility]: Transportation to medical appointments. 336-347-2287 ? Social Security Administration: May apply for disability if have a Stage IV cancer. 336-342-7796 1-800-772-1213 ? [place] [facility], Disability and Transit Services: Assists with nutrition, care and transit needs. 336-349-2343         

## 2015-09-17 ENCOUNTER — Other Ambulatory Visit (HOSPITAL_COMMUNITY): Payer: Self-pay | Admitting: Family Medicine

## 2015-09-20 ENCOUNTER — Encounter: Payer: Self-pay | Admitting: Family Medicine

## 2015-09-20 ENCOUNTER — Ambulatory Visit (INDEPENDENT_AMBULATORY_CARE_PROVIDER_SITE_OTHER): Payer: Medicare Other | Admitting: Family Medicine

## 2015-09-20 VITALS — BP 122/64 | HR 60 | Resp 18 | Ht 63.5 in | Wt 142.0 lb

## 2015-09-20 DIAGNOSIS — Z9181 History of falling: Secondary | ICD-10-CM

## 2015-09-20 DIAGNOSIS — Z0000 Encounter for general adult medical examination without abnormal findings: Secondary | ICD-10-CM | POA: Diagnosis not present

## 2015-09-20 DIAGNOSIS — G8929 Other chronic pain: Secondary | ICD-10-CM | POA: Diagnosis not present

## 2015-09-20 DIAGNOSIS — R296 Repeated falls: Secondary | ICD-10-CM | POA: Diagnosis not present

## 2015-09-20 DIAGNOSIS — M5440 Lumbago with sciatica, unspecified side: Secondary | ICD-10-CM | POA: Diagnosis not present

## 2015-09-20 MED ORDER — HYDROCODONE-ACETAMINOPHEN 5-325 MG PO TABS: ORAL_TABLET | ORAL | Status: DC

## 2015-09-20 MED ORDER — CLONAZEPAM 0.5 MG PO TABS: 0.5000 mg | ORAL_TABLET | Freq: Two times a day (BID) | ORAL | Status: DC | PRN

## 2015-09-20 NOTE — Patient Instructions (Signed)
Annual physical exam in 4.5 months  Careful not to fall, de clutter, no scatter rugs and good lighting  You are referred for epidural for back pain  Work on living will please as we discussed  Drink BOOST and nourish your body so you continue to get stronger  Fall Prevention in the Home  Falls can cause injuries. They can happen to people of all ages. There are many things you can do to make your home safe and to help prevent falls.  WHAT CAN I DO ON THE OUTSIDE OF MY HOME?  Regularly fix the edges of walkways and driveways and fix any cracks.  Remove anything that might make you trip as you walk through a door, such as a raised step or threshold.  Trim any bushes or trees on the path to your home.  Use bright outdoor lighting.  Clear any walking paths of anything that might make someone trip, such as rocks or tools.  Regularly check to see if handrails are loose or broken. Make sure that both sides of any steps have handrails.  Any raised decks and porches should have guardrails on the edges.  Have any leaves, snow, or ice cleared regularly.  Use sand or salt on walking paths during winter.  Clean up any spills in your garage right away. This includes oil or grease spills. WHAT CAN I DO IN THE BATHROOM?   Use night lights.  Install grab bars by the toilet and in the tub and shower. Do not use towel bars as grab bars.  Use non-skid mats or decals in the tub or shower.  If you need to sit down in the shower, use a plastic, non-slip stool.  Keep the floor dry. Clean up any water that spills on the floor as soon as it happens.  Remove soap buildup in the tub or shower regularly.  Attach bath mats securely with double-sided non-slip rug tape.  Do not have throw rugs and other things on the floor that can make you trip. WHAT CAN I DO IN THE BEDROOM?  Use night lights.  Make sure that you have a light by your bed that is easy to reach.  Do not use any sheets or  blankets that are too big for your bed. They should not hang down onto the floor.  Have a firm chair that has side arms. You can use this for support while you get dressed.  Do not have throw rugs and other things on the floor that can make you trip. WHAT CAN I DO IN THE KITCHEN?  Clean up any spills right away.  Avoid walking on wet floors.  Keep items that you use a lot in easy-to-reach places.  If you need to reach something above you, use a strong step stool that has a grab bar.  Keep electrical cords out of the way.  Do not use floor polish or wax that makes floors slippery. If you must use wax, use non-skid floor wax.  Do not have throw rugs and other things on the floor that can make you trip. WHAT CAN I DO WITH MY STAIRS?  Do not leave any items on the stairs.  Make sure that there are handrails on both sides of the stairs and use them. Fix handrails that are broken or loose. Make sure that handrails are as long as the stairways.  Check any carpeting to make sure that it is firmly attached to the stairs. Fix any carpet that is  loose or worn.  Avoid having throw rugs at the top or bottom of the stairs. If you do have throw rugs, attach them to the floor with carpet tape.  Make sure that you have a light switch at the top of the stairs and the bottom of the stairs. If you do not have them, ask someone to add them for you. WHAT ELSE CAN I DO TO HELP PREVENT FALLS?  Wear shoes that:  Do not have high heels.  Have rubber bottoms.  Are comfortable and fit you well.  Are closed at the toe. Do not wear sandals.  If you use a stepladder:  Make sure that it is fully opened. Do not climb a closed stepladder.  Make sure that both sides of the stepladder are locked into place.  Ask someone to hold it for you, if possible.  Clearly mark and make sure that you can see:  Any grab bars or handrails.  First and last steps.  Where the edge of each step is.  Use tools  that help you move around (mobility aids) if they are needed. These include:  Canes.  Walkers.  Scooters.  Crutches.  Turn on the lights when you go into a dark area. Replace any light bulbs as soon as they burn out.  Set up your furniture so you have a clear path. Avoid moving your furniture around.  If any of your floors are uneven, fix them.  If there are any pets around you, be aware of where they are.  Review your medicines with your doctor. Some medicines can make you feel dizzy. This can increase your chance of falling. Ask your doctor what other things that you can do to help prevent falls.   This information is not intended to replace advice given to you by your health care provider. Make sure you discuss any questions you have with your health care provider.   Document Released: 02/22/2009 Document Revised: 09/12/2014 Document Reviewed: 06/02/2014 Elsevier Interactive Patient Education [employer].

## 2015-09-20 NOTE — Progress Notes (Signed)
Subjective:    Patient ID: [name], female    DOB: 01-24-1929, 82 y.o.   MRN: [id number]  HPI  Preventive Screening-Counseling & Management   Patient present here today for a Medicare annual wellness visit.   Current Problems (verified)   Medications Prior to Visit Allergies (verified)   PAST HISTORY  Family History (updated)   Social History Widowed female mother of 2 sons; formerly worked in a bakery    Risk Factors  Current exercise habits:  Limited due to use of walker and ortho problems along with current cancer treatment  Dietary issues discussed:  Will incorporate more fresh fruits and vegetables   Cardiac risk factors: none significant  Depression Screen  (Note: if answer to either of the following is "Yes", a more complete depression screening is indicated)   Over the past two weeks, have you felt down, depressed or hopeless? No  Over the past two weeks, have you felt little interest or pleasure in doing things? No  Have you lost interest or pleasure in daily life? No  Do you often feel hopeless? No  Do you cry easily over simple problems? No   Activities of Daily Living  In your present state of health, do you have any difficulty performing the following activities?  Driving?: No but only within city limits Managing money?: No but does require assistance with writing due to vision difficulty  Feeding yourself?:No Getting from bed to chair?:No Climbing a flight of stairs?: Yes, assistive device used  Preparing food and eating?: Only cooks small meals on her own and eats out or has food brought in  Bathing or showering?:No Getting dressed?:No Getting to the toilet?:No Using the toilet?:No Moving around from place to place?: Yes, uses walker and has right leg pain   Fall Risk Assessment In the past year have you fallen or had a near fall?:No Are you currently taking any medications that make you dizzy?:No   Hearing Difficulties: No Do you  often ask people to speak up or repeat themselves?:No Do you experience ringing or noises in your ears?:No Do you have difficulty understanding soft or whispered voices?:No  Cognitive Testing  Alert? Yes Normal Appearance?Yes  Oriented to person? Yes Place? Yes  Time? Yes  Displays appropriate judgment?Yes  Can read the correct time from a watch face? yes Are you having problems remembering things?No    Advanced Directives have been discussed with the patient?Yes (discussed in detail)  Patient states that she has both living will and healthcare POA.    List the Names of Other Physician/Practitioners you currently use: care teams updated    Indicate any recent Medical Services you may have received from other than [facility] providers in the past year (date may be approximate).   Assessment:    Annual Wellness Exam   Plan:     Medicare Attestation  I have personally reviewed:  The patient's medical and social history  Their use of alcohol, tobacco or illicit drugs  Their current medications and supplements  The patient's functional ability including ADLs,fall risks, home safety risks, cognitive, and hearing and visual impairment  Diet and physical activities  Evidence for depression or mood disorders  The patient's weight, height, BMI, and visual acuity have been recorded in the chart. I have made referrals, counseling, and provided education to the patient based on review of the above and I have provided the patient with a written personalized care plan for preventive services.     Review of  Systems     Objective:   Physical Exam  BP 122/64 mmHg  Pulse 60  Resp 18  Ht 5' 3.5" (1.613 m)  Wt 142 lb (64.411 kg)  BMI 24.76 kg/m2  SpO2 99%      Assessment & Plan:  Medicare annual wellness visit, subsequent Annual exam as documented. Counseling done  re healthy lifestyle involving commitment to 150 minutes exercise per week, heart healthy diet, and attaining healthy  weight.The importance of adequate sleep also discussed. Regular seat belt use and home safety, is also discussed. Changes in health habits are decided on by the patient with goals and time frames  set for achieving them. Immunization and cancer screening needs are specifically addressed at this visit.   Chronic low back pain with sciatica Increased and uncontrolled, need to return for epidural, which has helped in the past  At high risk for falls H/o falls and fracture from a fall. Safety and fall risk reduction reviewed in detail

## 2015-09-22 DIAGNOSIS — Z9181 History of falling: Secondary | ICD-10-CM | POA: Insufficient documentation

## 2015-09-22 DIAGNOSIS — Z0000 Encounter for general adult medical examination without abnormal findings: Secondary | ICD-10-CM | POA: Insufficient documentation

## 2015-09-22 NOTE — Assessment & Plan Note (Signed)
Increased and uncontrolled, need to return for epidural, which has helped in the past

## 2015-09-22 NOTE — Assessment & Plan Note (Signed)
H/o falls and fracture from a fall. Safety and fall risk reduction reviewed in detail

## 2015-09-22 NOTE — Assessment & Plan Note (Signed)

## 2015-09-24 ENCOUNTER — Other Ambulatory Visit: Payer: Self-pay | Admitting: Family Medicine

## 2015-09-24 DIAGNOSIS — M545 Low back pain: Principal | ICD-10-CM

## 2015-09-24 DIAGNOSIS — G8929 Other chronic pain: Secondary | ICD-10-CM

## 2015-09-25 ENCOUNTER — Other Ambulatory Visit: Payer: Self-pay | Admitting: Family Medicine

## 2015-09-26 ENCOUNTER — Other Ambulatory Visit: Payer: Self-pay | Admitting: Family Medicine

## 2015-09-26 ENCOUNTER — Ambulatory Visit
Admission: RE | Admit: 2015-09-26 | Discharge: 2015-09-26 | Disposition: A | Discharge status: Home / Self Care | Payer: Medicare Other | Source: Ambulatory Visit | Attending: Family Medicine | Admitting: Family Medicine

## 2015-09-26 DIAGNOSIS — M545 Low back pain, unspecified: Secondary | ICD-10-CM

## 2015-09-26 DIAGNOSIS — G8929 Other chronic pain: Secondary | ICD-10-CM

## 2015-09-26 DIAGNOSIS — M5126 Other intervertebral disc displacement, lumbar region: Secondary | ICD-10-CM | POA: Diagnosis not present

## 2015-09-26 MED ORDER — METHYLPREDNISOLONE ACETATE 40 MG/ML INJ SUSP (RADIOLOG
120.0000 mg | Freq: Once | INTRAMUSCULAR | Status: AC
  Administered 2015-09-26: 120 mg via EPIDURAL

## 2015-09-26 MED ORDER — IOPAMIDOL (ISOVUE-M 200) INJECTION 41%
1.0000 mL | Freq: Once | INTRAMUSCULAR | Status: AC
  Administered 2015-09-26: 1 mL via EPIDURAL

## 2015-09-26 NOTE — Discharge Instructions (Signed)

## 2015-09-27 ENCOUNTER — Encounter: Payer: Self-pay | Admitting: Orthopaedic Surgery

## 2015-09-27 ENCOUNTER — Ambulatory Visit: Payer: Medicare Other | Admitting: Orthopaedic Surgery

## 2015-09-27 ENCOUNTER — Telehealth: Payer: Self-pay

## 2015-09-27 ENCOUNTER — Ambulatory Visit (INDEPENDENT_AMBULATORY_CARE_PROVIDER_SITE_OTHER): Payer: Medicare Other | Admitting: Orthopaedic Surgery

## 2015-09-27 VITALS — BP 159/71 | HR 62 | Temp 97.2°F | Ht 63.5 in | Wt 140.0 lb

## 2015-09-27 DIAGNOSIS — M25561 Pain in right knee: Secondary | ICD-10-CM

## 2015-09-27 DIAGNOSIS — J3089 Other allergic rhinitis: Secondary | ICD-10-CM

## 2015-09-27 DIAGNOSIS — I10 Essential (primary) hypertension: Secondary | ICD-10-CM

## 2015-09-27 NOTE — Progress Notes (Signed)
CC:  I have pain of my right knee. I would like an injection.  The patient has chronic pain of the right knee.  There is no recent trauma.  There is no redness.  Injections in the past have helped.  The knee has no redness, has an effusion and crepitus present.  ROM of the knee is 0-100.  Impression:  Chronic knee pain right.  Return: as needed.  PROCEDURE NOTE:  The patient requests injections of the right knee , verbal consent was obtained.  The right knee was prepped appropriately after time out was performed.   Sterile technique was observed and injection of 1 cc of Depo-Medrol 40 mg with several cc's of plain xylocaine. Anesthesia was provided by ethyl chloride and a 20-gauge needle was used to inject the knee area. The injection was tolerated well.  A band aid dressing was applied.  The patient was advised to apply ice later today and tomorrow to the injection sight as needed.

## 2015-09-27 NOTE — Telephone Encounter (Signed)
Avor not covered.  Alternatives are Irbesartan and losartan (with or without HCTZ).   Do you want to change?

## 2015-09-27 NOTE — Telephone Encounter (Signed)
Change entered historically, azor 10 mg and cozaar 100 mg one daily of each, pls let pt know, send in , d/c azor once dione and she needs nurse BP check in 6 to 8 weeks also pls

## 2015-09-28 ENCOUNTER — Other Ambulatory Visit: Payer: Self-pay

## 2015-09-28 MED ORDER — LOSARTAN POTASSIUM 100 MG PO TABS
100.0000 mg | ORAL_TABLET | Freq: Every day | ORAL | Status: DC
Start: 2015-09-28 — End: 2015-12-07

## 2015-09-28 MED ORDER — AMLODIPINE BESYLATE 10 MG PO TABS: 10.0000 mg | ORAL_TABLET | Freq: Every day | ORAL | Status: DC

## 2015-09-28 NOTE — Telephone Encounter (Signed)
Patient aware and medication sent to requested pharmacy.

## 2015-09-28 NOTE — Telephone Encounter (Signed)
Yes, soory, azor is amlodipine and another drug, amlodipine is the drug entered

## 2015-09-28 NOTE — Telephone Encounter (Signed)
Did you mean amlodipine 10?

## 2015-10-02 ENCOUNTER — Other Ambulatory Visit: Payer: Self-pay | Admitting: Family Medicine

## 2015-10-02 NOTE — Progress Notes (Signed)
[name] Nakayama, MD [address] [place] [address]  Pancreatic cancer metastasized to liver [facility]) - Plan: Amb Referral to Nutrition and Diabetic E  Anemia due to antineoplastic chemotherapy  Weight loss - Plan: Amb Referral to Nutrition and Diabetic E  CURRENT THERAPY: Palliative Gemcitabine/Abraxane  INTERVAL HISTORY: [name] 82 y.o. female returns for followup of Stage IV pancreatic cancer.    Pancreatic cancer metastasized to liver ([place])   03/10/2014 Initial Diagnosis Pancreatic cancer metastasized to liver   03/22/2014 -  Chemotherapy Abraxane/Gemzar days 1, 8, every 28 days.  Day 15 was cancelled due to leukopenia and thrombocytopenia on day 15 cycle 1.   05/24/2014 Treatment Plan Change Day 8 of cycle 3 is held with ANC of 1.1   06/05/2014 Imaging CT C/A/P . Interval decrease in size of the pancreatic tail mass.Improved hepatic metastatic disease. No new lesions. No CT findings for metastatic disease involving the chest.   08/09/2014 Tumor Marker CA 19-9= 33 (WNL)   10/26/2014 Imaging MRI- Continued interval decrease in size of the hepatic metastatic lesions and no new lesions are identified. Continued decrease in size of the pancreatic tail lesion.   01/03/2015 Tumor Marker Results for MIRI, TURNIER (MRN [id number]) as of 01/18/2015 12:52  01/03/2015 10:00 CA 19-9: 14    01/17/2015 Imaging MRI- Response to therapy of hepatic metastasis.  Similar size of a pancreatic tail lesion.   03/20/2015 Imaging MRI-L spine- Severe disc space narrowing at L2-L3, with endplate reactive changes. Large disc extrusion into the ventral epidural space,central to the RIGHT with a cephalad migrated free fragment. Additional Large disc extrusion into the retroperitone...   03/22/2015 Imaging CT pelvis- No evidence for metastatic disease within the pelvis.   07/24/2015 Imaging MRI abd- Continued response to therapy, with no residual detectable liver metastases. No new sites  of metastatic disease in the abdomen.    08/15/2015 Code Status  She confirms desire for DNR status.    I personally reviewed and went over laboratory results with the patient.  The results are noted within this dictation.  Labs meet treatment criteria today.  She notes evening LE edema that resolves in the AM.  None present on exam today.  She notes some weeping of fluid from her skin on her LE.  None present today.  She notes that it is clear and only occurs in the evening.  She is unsure where it originates from.  She reports that in the evening her legs are not "large; they always stay small."  She notes a decrease in taste in foods.  She still has an appetite.  She recently started boost supplementation.  She is noted to have a 10 lb weight loss over the past 6 weeks.  Review of Systems  Constitutional: Positive for weight loss (10 lbs x 6 weeks.  Change in taste). Negative for fever and chills.  HENT: Negative.  Negative for congestion and sore throat.   Eyes: Negative.  Negative for blurred vision and double vision.  Respiratory: Negative.  Negative for cough, sputum production and wheezing.   Cardiovascular: Negative.  Negative for chest pain.  Gastrointestinal: Negative.  Negative for nausea, vomiting, abdominal pain, diarrhea, constipation, blood in stool and melena.  Genitourinary: Negative.  Negative for dysuria, frequency and hematuria.  Musculoskeletal: Positive for back pain and joint pain (Knee pain followed by Dr. Luna Glasgow). Negative for falls.  Skin: Negative.   Neurological: Negative.  Negative for headaches.  Endo/Heme/Allergies: Negative.   Psychiatric/Behavioral: Negative.     Past Medical History  Diagnosis Date  . Hypertension 20 years   . Chronic diarrhea   . Anxiety   . Depression   . Kidney stone     hx/ crushed   . Hx of adenomatous colonic polyps     tubular adenomas, last found in 2008  . GERD (gastroesophageal reflux disease)   . Erosive esophagitis     . Schatzki's ring 08/26/10    Last dilated on EGD by Dr. Trevor Iha [facility], linear gastric erosions, BI hemigastrectomy  . Hyperplastic colon polyp 03/19/10    tcs by Dr. Gala Romney  . Pancreatic cancer ([place]) 02/2014  . Pancreatic cancer metastasized to liver ([place]) 03/10/2014  . Opioid contract exists 04/18/2015    With Dr. Moshe Cipro  . DNR (do not resuscitate) 08/16/2015  . Anemia due to antineoplastic chemotherapy 09/12/2015    Started Aranesp 500 mcg on 09/12/2015    Past Surgical History  Procedure Laterality Date  . Stomach ulcer  50 years ago     had some of her stomach removed   . Bunion removal      from both feet   . Left shoulder surgery  2009    DR HARRISON  . Colonoscopy  03/19/2010    DR Gala Romney,, normal TI, pancolonic diverticula, random colon bx neg., hyperplastic polyps removed  . Esophagogastroduodenoscopy  11/22/2003    DR Gala Romney, erosive RE, Billroth I  . Esophagogastroduodenoscopy  08/26/10    Dr. Gala Romney- moderate severe ERE, Scahtzki ring s/p dilation, Billroth I, linear gastric erosions, bx-gastric xanthelasma  . Breast lumpectomy Left   . Orif ankle fracture Right 05/22/2013    Procedure: OPEN REDUCTION INTERNAL FIXATION (ORIF) RIGHT ANKLE FRACTURE;  Surgeon: Sanjuana Kava, MD;  Location: [facility];  Service: Orthopedics;  Laterality: Right;  . Cholecystectomy  1965     Family History  Problem Relation Age of Onset  . Hypertension Mother   . Kidney failure Brother     X1 ON DIALYSIS  . Diabetes Sister   . Cancer Sister     X2  . Hypertension Father   . Liver disease Neg Hx   . Colon cancer Neg Hx     Social History   Social History  . Marital Status: Widowed    Spouse Name: N/A  . Number of Children: 2  . Years of Education: N/A   Occupational History  . retired from Teacher, adult education    Social History Main Topics  . Smoking status: Former Smoker    Types: Cigarettes    Quit date: 11/09/2011  . Smokeless tobacco: Never Used     Comment: quit a few weeks ago   .  Alcohol Use: No  . Drug Use: No  . Sexual Activity: No   Other Topics Concern  . Not on file   Social History Narrative     PHYSICAL EXAMINATION  ECOG PERFORMANCE STATUS: 1 - Symptomatic but completely ambulatory  There were no vitals filed for this visit.  BP 157/67 P 59  R 18 T 98 O2 100% RA  GENERAL:alert, no distress, comfortable, cooperative, smiling and in chemo-recliner, and unaccompanied SKIN: skin color, texture, turgor are normal, no rashes or significant lesions, right lower arm ecchymosis x 2- improving. HEAD: Normocephalic, No masses, lesions, tenderness or abnormalities EYES: normal, EOMI, Conjunctiva are pink and non-injected EARS: External ears normal OROPHARYNX:mucous membranes are moist  NECK: supple, no adenopathy, trachea midline LYMPH:  not examined BREAST:not  examined LUNGS: clear to auscultation  HEART: regular rate & rhythm, no murmurs, no gallops, S1 normal and S2 normal ABDOMEN:abdomen soft, non-tender and normal bowel sounds BACK: Back symmetric, no curvature. EXTREMITIES:less then 2 second capillary refill, no joint deformities, effusion, or inflammation, no edema, no skin discoloration, no clubbing, no cyanosis  NEURO: alert & oriented x 3 with fluent speech, no focal motor/sensory deficits, gait normal   LABORATORY DATA: CBC    Component Value Date/Time   WBC 9.6 10/03/2015 0924   RBC 3.95 10/03/2015 0924   RBC 3.39* 08/15/2015 1300   HGB 11.1* 10/03/2015 0924   HCT 34.8* 10/03/2015 0924   PLT 256 10/03/2015 0924   MCV 88.1 10/03/2015 0924   MCH 28.1 10/03/2015 0924   MCHC 31.9 10/03/2015 0924   RDW 20.0* 10/03/2015 0924   LYMPHSABS 0.8 10/03/2015 0924   MONOABS 1.0 10/03/2015 0924   EOSABS 0.0 10/03/2015 0924   BASOSABS 0.0 10/03/2015 0924      Chemistry      Component Value Date/Time   NA 138 10/03/2015 0924   K 4.2 10/03/2015 0924   CL 104 10/03/2015 0924   CO2 27 10/03/2015 0924   BUN 33* 10/03/2015 0924   CREATININE  0.95 10/03/2015 0924   CREATININE 0.97 02/14/2014 0758      Component Value Date/Time   CALCIUM 8.8* 10/03/2015 0924   ALKPHOS 75 10/03/2015 0924   AST 19 10/03/2015 0924   ALT 25 10/03/2015 0924   BILITOT 0.9 10/03/2015 0924       PENDING LABS:   RADIOGRAPHIC STUDIES:  Dg Epidural/nerve Root  09/26/2015  CLINICAL DATA:  Low back pain.  RIGHT leg pain.  Extruded disc L2-L3 EXAM: EPIDURAL/NERVE ROOT FLUOROSCOPY TIME:  11 seconds corresponding to a Dose Area Product of 31 Gy*m2 PROCEDURE: The procedure, risks, benefits, and alternatives were explained to the patient. Questions regarding the procedure were encouraged and answered. The patient understands and consents to the procedure. Patient was pre-medicated with Benadryl 50 mg prior to injection and suffered no ill effects. RIGHT L3 NERVE ROOT BLOCK AND TRANSFORAMINAL EPIDURAL: A posterior oblique approach was taken to the intervertebral foramen on the RIGHT at L3-L4 using a curved 22 gauge spinal needle. Injection of Isovue-M 200 outlined the RIGHT L3 nerve root and showed good epidural spread. No vascular opacification is seen. 120.0 mg of Depo-Medrol mixed with 2 mL 1% lidocaine were instilled. The procedure was well-tolerated, and the patient was discharged thirty minutes following the injection in good condition. COMPLICATIONS: None IMPRESSION: Technically successful injection consisting of a RIGHT L3 nerve root block and RIGHT L3-4 transforaminal epidural. Electronically Signed   By: Staci Righter M.D.   On: 09/26/2015 15:35     PATHOLOGY:    ASSESSMENT AND PLAN:  Pancreatic cancer metastasized to liver Stage IV Pancreatic Cancer, on systemic therapy with Abraxane and Gemzar in a day 1, 8 fashion every 28 days.  Continued response to therapy.  Oncology history is up to date.   Patient has entered into a pain contract with her primary care provider for non-oncologic pain.  She has been followed by Dr. Luna Glasgow (Ortho) for her  knee and ankle pain.  She is S/P corticosteroid injection by Dr. Luna Glasgow on 09/27/2015.    Pre-chemo labs as ordered: CBC diff, CMET, CA 19-9.   Labs meet treatment criteria.  Will HOLD Aranesp today.  Given her long time on treatment, if her QOL were to be affected from chemotherapy, a holiday may be  indicated.  We will continue to monitor for side effects of therapy and her QOL.  She notes a change in her taste for foods.  She admits to an appetite.  She recently started Boost supplementation which she enjoys.  Her weight is down about 10 lbs compared to 6 weeks ago.  I will consult Burtis Junes, RD.  DNR  Return in 4 weeks for follow-up.  No imaging studies ordered/scheduled at this time and will be addressed in the future.  Anemia due to antineoplastic chemotherapy Chemotherapy-induced anemia, on Aranesp 500 mcg every 21 days beginning on 09/12/2015.  HGB today is 11.1 g/dL.  Aranesp will be held as a result today.    ORDERS PLACED FOR THIS ENCOUNTER: Orders Placed This Encounter  Procedures  . Amb Referral to Nutrition and Diabetic E    MEDICATIONS PRESCRIBED THIS ENCOUNTER: No orders of the defined types were placed in this encounter.    THERAPY PLAN:  Continue treatment as planned, unless imaging is concerning for progression of disease.  All questions were answered. The patient knows to call the clinic with any problems, questions or concerns. We can certainly see the patient much sooner if necessary.  Patient and plan discussed with Dr. Ancil Linsey and she is in agreement with the aforementioned.   This note is electronically signed by: Doy Mince 10/03/2015 5:18 PM

## 2015-10-02 NOTE — Assessment & Plan Note (Addendum)
Stage IV Pancreatic Cancer, on systemic therapy with Abraxane and Gemzar in a day 1, 8 fashion every 28 days.  Continued response to therapy.  Oncology history is up to date.   Patient has entered into a pain contract with her primary care provider for non-oncologic pain.  She has been followed by Dr. Luna Glasgow (Ortho) for her knee and ankle pain.  She is S/P corticosteroid injection by Dr. Luna Glasgow on 09/27/2015.    Pre-chemo labs as ordered: CBC diff, CMET, CA 19-9.   Labs meet treatment criteria.  Will HOLD Aranesp today.  Given her long time on treatment, if her QOL were to be affected from chemotherapy, a holiday may be indicated.  We will continue to monitor for side effects of therapy and her QOL.  She notes a change in her taste for foods.  She admits to an appetite.  She recently started Boost supplementation which she enjoys.  Her weight is down about 10 lbs compared to 6 weeks ago.  I will consult Burtis Junes, RD.  DNR  Return in 4 weeks for follow-up.  No imaging studies ordered/scheduled at this time and will be addressed in the future.

## 2015-10-02 NOTE — Assessment & Plan Note (Addendum)
Chemotherapy-induced anemia, on Aranesp 500 mcg every 21 days beginning on 09/12/2015.  HGB today is 11.1 g/dL.  Aranesp will be held as a result today.

## 2015-10-03 ENCOUNTER — Encounter (HOSPITAL_COMMUNITY): Payer: Medicare Other

## 2015-10-03 ENCOUNTER — Encounter (HOSPITAL_BASED_OUTPATIENT_CLINIC_OR_DEPARTMENT_OTHER): Payer: Medicare Other | Admitting: Oncology

## 2015-10-03 ENCOUNTER — Encounter (HOSPITAL_BASED_OUTPATIENT_CLINIC_OR_DEPARTMENT_OTHER): Payer: Medicare Other

## 2015-10-03 ENCOUNTER — Encounter: Payer: Self-pay | Admitting: Dietician

## 2015-10-03 VITALS — BP 157/63 | HR 55 | Temp 97.4°F | Resp 18 | Wt 138.2 lb

## 2015-10-03 DIAGNOSIS — C259 Malignant neoplasm of pancreas, unspecified: Secondary | ICD-10-CM | POA: Diagnosis not present

## 2015-10-03 DIAGNOSIS — C787 Secondary malignant neoplasm of liver and intrahepatic bile duct: Secondary | ICD-10-CM

## 2015-10-03 DIAGNOSIS — Z5111 Encounter for antineoplastic chemotherapy: Secondary | ICD-10-CM

## 2015-10-03 DIAGNOSIS — C252 Malignant neoplasm of tail of pancreas: Secondary | ICD-10-CM | POA: Diagnosis not present

## 2015-10-03 DIAGNOSIS — T451X5A Adverse effect of antineoplastic and immunosuppressive drugs, initial encounter: Secondary | ICD-10-CM

## 2015-10-03 DIAGNOSIS — C801 Malignant (primary) neoplasm, unspecified: Secondary | ICD-10-CM | POA: Diagnosis not present

## 2015-10-03 DIAGNOSIS — R634 Abnormal weight loss: Secondary | ICD-10-CM | POA: Diagnosis not present

## 2015-10-03 DIAGNOSIS — D6481 Anemia due to antineoplastic chemotherapy: Secondary | ICD-10-CM | POA: Diagnosis not present

## 2015-10-03 LAB — CBC WITH DIFFERENTIAL/PLATELET
BASOS ABS: 0 10*3/uL (ref 0.0–0.1)
Basophils Relative: 0 %
EOS ABS: 0 10*3/uL (ref 0.0–0.7)
EOS PCT: 0 %
HCT: 34.8 % — ABNORMAL LOW (ref 36.0–46.0)
Hemoglobin: 11.1 g/dL — ABNORMAL LOW (ref 12.0–15.0)
Lymphocytes Relative: 9 %
Lymphs Abs: 0.8 10*3/uL (ref 0.7–4.0)
MCH: 28.1 pg (ref 26.0–34.0)
MCHC: 31.9 g/dL (ref 30.0–36.0)
MCV: 88.1 fL (ref 78.0–100.0)
Monocytes Absolute: 1 10*3/uL (ref 0.1–1.0)
Monocytes Relative: 11 %
Neutro Abs: 7.7 10*3/uL (ref 1.7–7.7)
Neutrophils Relative %: 80 %
PLATELETS: 256 10*3/uL (ref 150–400)
RBC: 3.95 MIL/uL (ref 3.87–5.11)
RDW: 20 % — ABNORMAL HIGH (ref 11.5–15.5)
WBC: 9.6 10*3/uL (ref 4.0–10.5)

## 2015-10-03 LAB — COMPREHENSIVE METABOLIC PANEL
ALT: 25 U/L (ref 14–54)
AST: 19 U/L (ref 15–41)
Albumin: 4 g/dL (ref 3.5–5.0)
Alkaline Phosphatase: 75 U/L (ref 38–126)
Anion gap: 7 (ref 5–15)
BUN: 33 mg/dL — AB (ref 6–20)
CHLORIDE: 104 mmol/L (ref 101–111)
CO2: 27 mmol/L (ref 22–32)
CREATININE: 0.95 mg/dL (ref 0.44–1.00)
Calcium: 8.8 mg/dL — ABNORMAL LOW (ref 8.9–10.3)
GFR calc non Af Amer: 53 mL/min — ABNORMAL LOW (ref >60)
Glucose, Bld: 102 mg/dL — ABNORMAL HIGH (ref 65–99)
Potassium: 4.2 mmol/L (ref 3.5–5.1)
SODIUM: 138 mmol/L (ref 135–145)
Total Bilirubin: 0.9 mg/dL (ref 0.3–1.2)
Total Protein: 6.7 g/dL (ref 6.5–8.1)

## 2015-10-03 MED ORDER — SODIUM CHLORIDE 0.9 % IV SOLN
Freq: Once | INTRAVENOUS | Status: AC
  Administered 2015-10-03: 10:00:00 via INTRAVENOUS

## 2015-10-03 MED ORDER — SODIUM CHLORIDE 0.9 % IJ SOLN: 10.0000 mL | INTRAMUSCULAR | Status: DC | PRN

## 2015-10-03 MED ORDER — HEPARIN SOD (PORK) LOCK FLUSH 100 UNIT/ML IV SOLN
500.0000 [IU] | Freq: Once | INTRAVENOUS | Status: AC
  Administered 2015-10-03: 500 [IU] via INTRAVENOUS

## 2015-10-03 MED ORDER — SODIUM CHLORIDE 0.9 % IV SOLN
1000.0000 mg/m2 | Freq: Once | INTRAVENOUS | Status: AC
  Administered 2015-10-03: 1786 mg via INTRAVENOUS
  Filled 2015-10-03: qty 46.97

## 2015-10-03 MED ORDER — HEPARIN SOD (PORK) LOCK FLUSH 100 UNIT/ML IV SOLN: 500.0000 [IU] | Freq: Once | INTRAVENOUS | Status: AC | PRN

## 2015-10-03 MED ORDER — SODIUM CHLORIDE 0.9 % IV SOLN
Freq: Once | INTRAVENOUS | Status: AC
  Administered 2015-10-03: 8 mg via INTRAVENOUS
  Filled 2015-10-03: qty 4

## 2015-10-03 MED ORDER — PACLITAXEL PROTEIN-BOUND CHEMO INJECTION 100 MG
125.0000 mg/m2 | Freq: Once | INTRAVENOUS | Status: AC
  Administered 2015-10-03: 225 mg via INTRAVENOUS
  Filled 2015-10-03: qty 45

## 2015-10-03 NOTE — Patient Instructions (Signed)
[facility] Discharge Instructions for Patients Receiving Chemotherapy   Beginning January 23rd 2017 lab work for the [facility] will be done in the  Main lab at [facility] on 1st floor. If you have a lab appointment with the [facility] please come in thru the  Main Entrance and check in at the main information desk   Today you received the following chemotherapy agents: Abraxane and Gemxar.     If you develop nausea and vomiting, or diarrhea that is not controlled by your medication, call the clinic.  The clinic phone number is [phone]. Office hours are Monday-Friday 8:30am-5:00pm.  BELOW ARE SYMPTOMS THAT SHOULD BE REPORTED IMMEDIATELY:  *FEVER GREATER THAN 101.0 F  *CHILLS WITH OR WITHOUT FEVER  NAUSEA AND VOMITING THAT IS NOT CONTROLLED WITH YOUR NAUSEA MEDICATION  *UNUSUAL SHORTNESS OF BREATH  *UNUSUAL BRUISING OR BLEEDING  TENDERNESS IN MOUTH AND THROAT WITH OR WITHOUT PRESENCE OF ULCERS  *URINARY PROBLEMS  *BOWEL PROBLEMS  UNUSUAL RASH Items with * indicate a potential emergency and should be followed up as soon as possible. If you have an emergency after office hours please contact your primary care physician or go to the nearest emergency department.  Please call the clinic during office hours if you have any questions or concerns.   You may also contact the Patient Navigator at [phone] should you have any questions or need assistance in obtaining follow up care.      Resources For Cancer Patients and their Caregivers ? American Cancer Society: Can assist with transportation, wigs, general needs, runs Look Good Feel Better.        [phone] ? Cancer Care: Provides financial assistance, online support groups, medication/co-pay assistance.  1-800-813-HOPE 6782624796) ? [facility] Assists [place] cancer patients and their families through emotional , educational and financial support.   904-348-8953 ? [place] DSS Where to apply for food stamps, Medicaid and utility assistance. 386-681-7092 ? RCATS: Transportation to medical appointments. 763-637-5184 ? Social Security Administration: May apply for disability if have a Stage IV cancer. [phone] 773-279-9902 ? [employer], Disability and Transit Services: Assists with nutrition, care and transit needs. [phone]

## 2015-10-03 NOTE — Patient Instructions (Signed)
[place] at [facility] Discharge Instructions  RECOMMENDATIONS MADE BY THE CONSULTANT AND ANY TEST RESULTS WILL BE SENT TO YOUR REFERRING PHYSICIAN.  No Aranesp Today  Chemo Today and next week  We will contact Burtis Junes our Dietician in regards to your weight loss  Return in 4 weeks for follow-up with Dr.Penland   Thank you for choosing [place] at [facility] to provide your oncology and hematology care.  To afford each patient quality time with our provider, please arrive at least 15 minutes before your scheduled appointment time.   Beginning January 23rd 2017 lab work for the [name] will be done in the  Main lab at [facility] on 1st floor. If you have a lab appointment with the [facility] please come in thru the  Main Entrance and check in at the main information desk  You need to re-schedule your appointment should you arrive 10 or more minutes late.  We strive to give you quality time with our providers, and arriving late affects you and other patients whose appointments are after yours.  Also, if you no show three or more times for appointments you may be dismissed from the clinic at the providers discretion.     Again, thank you for choosing [facility].  Our hope is that these requests will decrease the amount of time that you wait before being seen by our physicians.       _____________________________________________________________  Should you have questions after your visit to [facility], please contact our office at [phone] between the hours of 8:30 a.m. and 4:30 p.m.  Voicemails left after 4:30 p.m. will not be returned until the following business day.  For prescription refill requests, have your pharmacy contact our office.         Resources For Cancer Patients and their Caregivers ? American Cancer Society: Can assist with transportation, wigs, general needs, runs Look  Good Feel Better.        [phone] ? Cancer Care: Provides financial assistance, online support groups, medication/co-pay assistance.  1-800-813-HOPE 325-678-2261) ? [place] Assists [place] cancer patients and their families through emotional , educational and financial support.  364-832-7723 ? [place] DSS Where to apply for food stamps, Medicaid and utility assistance. 918-165-7202 ? RCATS: Transportation to medical appointments. [phone] ? Social Security Administration: May apply for disability if have a Stage IV cancer. 630-095-0068 7071260813 ? [employer], Disability and Transit Services: Assists with nutrition, care and transit needs. [place] Support Programs: @10RELATIVEDAYS @ > Cancer Support Group  2nd Tuesday of the month 1pm-2pm, Journey Room  > Creative Journey  3rd Tuesday of the month 1130am-1pm, Journey Room  > Look Good Feel Better  1st Wednesday of the month 10am-12 noon, Journey Room (Call [place] to register [phone])

## 2015-10-03 NOTE — Progress Notes (Signed)
Patient tolerated infusion well.  VSS.   

## 2015-10-03 NOTE — Progress Notes (Signed)
RD asked by Oncology PA to assess patient secondary to recent weight loss, taste changes. Patient has stage 4 pancreatic cancer going on 2 years and has been largely able to maintain weight thus far  Contacted Pt by Visiting during her infusion  Wt Readings from Last 10 Encounters:  10/03/15 138 lb 3.2 oz (62.687 kg)  09/27/15 140 lb (63.504 kg)  09/20/15 142 lb (64.411 kg)  09/12/15 144 lb (65.318 kg)  09/05/15 145 lb 3.2 oz (65.862 kg)  08/23/15 145 lb 3.2 oz (65.862 kg)  08/15/15 147 lb 9.6 oz (66.951 kg)  08/08/15 144 lb 12.8 oz (65.681 kg)  07/25/15 149 lb 8 oz (67.813 kg)  07/24/15 147 lb (66.679 kg)   Patient weight has fallen by 6-9 lbs in the last month or so. When she started treatment, she appears to have weighed 160 lbs. Has gradual decreased, as expected, but her loss has just recently become more rapid  Patient reports oral intake as worse than usual and is suffering from symptoms including loss of appetite/taste  Went through patient's dietary recall. Objectively, pt is still eating very well! Per her report she eats 3 meals and 2 snacks daily. These meals all contain at least 1 high protein source. She recently has been forcing down Ensure as well in attempt to maintain weight. Though eating the same number of meals/snacks as usual, she believes the portions are smaller and that is why she has lost the weight.   She says she has an appetite, but when she goes to eat she quickly loses her desire to eat due to taste loss. She notes an instance where she picked up BBQ chicken and thought they had prepared it wrong, however when she tried again a week later she says it still tasted bad.  Discussed with pt that taste changes are part of disease and treatment process and, unfortunately, are likely to be a chronic issue.   RD first went over high protein/kcal foods. Educated that because her intake is less, she needs to prioritize these types of foods. Listed protein sources (she  cant eat legumes d/t gas) and high fat sources (she did not report any intolerance to high fat items).   Secondly, we discussed how to flavor her foods. Encouraged her to explore different spices and herbs. Asked her to walk down supermarket aisle and find a few that smell appealing and try adding these to her food. Because she may need to chronically add a substantial amount of spice to foods in order to taste, emphasized she should find onse she can eat again and again. She notes she already uses garlic/onion powder, Explained how sweet/tart are the tastes she will maintain the longest. While fruit is sweet, it is not as high in kcals, asked her to use fruit as a topping vs a meal in itself. Reccommended marinades.   She admits to enjoying smoothies/milkshakes. Differentiated between a "good smoothie" (contains high fat foods like PB/avocado, whole milk and protein powder) vs a" bad smoothie" (just fruit and juice).   Pt seemed to be in a good mood and feeling well overall. Towards the tail end of conversation, her sister arrived. She sounds to be of great support to patient and was nagging pt about how she would get all her sisters clothes if she continued to lose weight. This support is very important.   Because she admits that she does not like the original  Ensure/Boost, RD reccommended the clears/breeze. She said she had  the peach Breeze today and it was "deliciious". Unsure if [employer] products are part of program. In any event, she said that she would rather have the supplement that tastes bad if it would help maintain her weight better. Attempted to dissuade her from choosing something she had to "chooke down" to maintain higher QOL, but she was certain she wanted the regular Ensure plus. Will order for her to pick up.    Left my contact info, coupons, samples handouts titled "Taste and smell changes"  Burtis Junes RD, LDN Nutrition Pager: [phone] 10/03/2015 12:51 PM

## 2015-10-03 NOTE — Progress Notes (Signed)
Patient not to get Aranesp per lab results and treatment parameters.

## 2015-10-04 LAB — CANCER ANTIGEN 19-9: CA 19-9: 23 U/mL (ref 0–35)

## 2015-10-05 ENCOUNTER — Encounter (HOSPITAL_COMMUNITY): Payer: Self-pay | Admitting: *Deleted

## 2015-10-05 ENCOUNTER — Telehealth (HOSPITAL_COMMUNITY): Payer: Self-pay | Admitting: *Deleted

## 2015-10-05 NOTE — Progress Notes (Signed)
Pt called the clinic confused about her blood work, specifically her CA 19-9.  Explained to pt that even though the number was elevated from previous result, that the value is still WNL.  She verbalizes understanding of this. All questions answered.

## 2015-10-09 ENCOUNTER — Other Ambulatory Visit: Payer: Self-pay

## 2015-10-09 MED ORDER — HYDROCODONE-ACETAMINOPHEN 5-325 MG PO TABS: ORAL_TABLET | ORAL | Status: DC

## 2015-10-09 NOTE — Telephone Encounter (Signed)
Patient called to ask about right knee, states still not feeling much better since she was here for injection 09/27/15.  Please advise.  (patient also has asked about picking up a copy of the office notes from this date; relayed process of coming in to sign release of information form, which she said she will come by to do tomorrow)

## 2015-10-10 ENCOUNTER — Encounter (HOSPITAL_BASED_OUTPATIENT_CLINIC_OR_DEPARTMENT_OTHER): Payer: Medicare Other

## 2015-10-10 VITALS — BP 167/63 | HR 62 | Temp 97.8°F | Resp 18 | Wt 139.2 lb

## 2015-10-10 DIAGNOSIS — C787 Secondary malignant neoplasm of liver and intrahepatic bile duct: Secondary | ICD-10-CM | POA: Diagnosis not present

## 2015-10-10 DIAGNOSIS — C252 Malignant neoplasm of tail of pancreas: Secondary | ICD-10-CM | POA: Diagnosis not present

## 2015-10-10 DIAGNOSIS — C259 Malignant neoplasm of pancreas, unspecified: Secondary | ICD-10-CM

## 2015-10-10 DIAGNOSIS — C801 Malignant (primary) neoplasm, unspecified: Secondary | ICD-10-CM | POA: Diagnosis not present

## 2015-10-10 DIAGNOSIS — Z5111 Encounter for antineoplastic chemotherapy: Secondary | ICD-10-CM | POA: Diagnosis present

## 2015-10-10 LAB — COMPREHENSIVE METABOLIC PANEL
ALBUMIN: 3.8 g/dL (ref 3.5–5.0)
ALK PHOS: 70 U/L (ref 38–126)
ALT: 26 U/L (ref 14–54)
AST: 20 U/L (ref 15–41)
Anion gap: 6 (ref 5–15)
BILIRUBIN TOTAL: 0.7 mg/dL (ref 0.3–1.2)
BUN: 29 mg/dL — ABNORMAL HIGH (ref 6–20)
CALCIUM: 8.5 mg/dL — AB (ref 8.9–10.3)
CO2: 27 mmol/L (ref 22–32)
CREATININE: 0.77 mg/dL (ref 0.44–1.00)
Chloride: 105 mmol/L (ref 101–111)
GFR calc Af Amer: >60 mL/min (ref >60)
GFR calc non Af Amer: >60 mL/min (ref >60)
GLUCOSE: 106 mg/dL — AB (ref 65–99)
Potassium: 4.3 mmol/L (ref 3.5–5.1)
SODIUM: 138 mmol/L (ref 135–145)
TOTAL PROTEIN: 6.4 g/dL — AB (ref 6.5–8.1)

## 2015-10-10 LAB — CBC WITH DIFFERENTIAL/PLATELET
Basophils Absolute: 0 10*3/uL (ref 0.0–0.1)
Basophils Relative: 0 %
EOS ABS: 0 10*3/uL (ref 0.0–0.7)
Eosinophils Relative: 1 %
HEMATOCRIT: 31.8 % — AB (ref 36.0–46.0)
HEMOGLOBIN: 10.3 g/dL — AB (ref 12.0–15.0)
LYMPHS ABS: 1.2 10*3/uL (ref 0.7–4.0)
LYMPHS PCT: 30 %
MCH: 28.9 pg (ref 26.0–34.0)
MCHC: 32.4 g/dL (ref 30.0–36.0)
MCV: 89.1 fL (ref 78.0–100.0)
MONOS PCT: 4 %
Monocytes Absolute: 0.2 10*3/uL (ref 0.1–1.0)
NEUTROS ABS: 2.6 10*3/uL (ref 1.7–7.7)
NEUTROS PCT: 65 %
Platelets: 107 10*3/uL — ABNORMAL LOW (ref 150–400)
RBC: 3.57 MIL/uL — AB (ref 3.87–5.11)
RDW: 19 % — ABNORMAL HIGH (ref 11.5–15.5)
WBC: 4 10*3/uL (ref 4.0–10.5)

## 2015-10-10 MED ORDER — HEPARIN SOD (PORK) LOCK FLUSH 100 UNIT/ML IV SOLN
500.0000 [IU] | Freq: Once | INTRAVENOUS | Status: AC
  Administered 2015-10-10: 500 [IU] via INTRAVENOUS

## 2015-10-10 MED ORDER — HEPARIN SOD (PORK) LOCK FLUSH 100 UNIT/ML IV SOLN: 500.0000 [IU] | Freq: Once | INTRAVENOUS | Status: AC | PRN

## 2015-10-10 MED ORDER — SODIUM CHLORIDE 0.9 % IV SOLN
Freq: Once | INTRAVENOUS | Status: AC
  Administered 2015-10-10: 11:00:00 via INTRAVENOUS

## 2015-10-10 MED ORDER — SODIUM CHLORIDE 0.9 % IV SOLN
Freq: Once | INTRAVENOUS | Status: AC
  Administered 2015-10-10: 8 mg via INTRAVENOUS
  Filled 2015-10-10: qty 4

## 2015-10-10 MED ORDER — SODIUM CHLORIDE 0.9 % IJ SOLN: 10.0000 mL | INTRAMUSCULAR | Status: DC | PRN

## 2015-10-10 MED ORDER — SODIUM CHLORIDE 0.9 % IV SOLN
1000.0000 mg/m2 | Freq: Once | INTRAVENOUS | Status: AC
  Administered 2015-10-10: 1786 mg via INTRAVENOUS
  Filled 2015-10-10: qty 46.97

## 2015-10-10 MED ORDER — PACLITAXEL PROTEIN-BOUND CHEMO INJECTION 100 MG
125.0000 mg/m2 | Freq: Once | INTRAVENOUS | Status: AC
  Administered 2015-10-10: 225 mg via INTRAVENOUS
  Filled 2015-10-10: qty 45

## 2015-10-10 NOTE — Telephone Encounter (Signed)
Sometimes the injection does not help as well as before.  This is not unusual.  She has underlying medical problems which can also affect her pain situation.

## 2015-10-10 NOTE — Progress Notes (Signed)
Patient tolerated infusion well.  VSS.   

## 2015-10-10 NOTE — Patient Instructions (Signed)
[facility] Discharge Instructions for Patients Receiving Chemotherapy   Beginning January 23rd 2017 lab work for the Cancer Center will be done in the  Main lab at [facility] on 1st floor. If you have a lab appointment with the Cancer Center please come in thru the  Main Entrance and check in at the main information desk   Today you received the following chemotherapy agents:  Abraxane and Gemzar.  If you develop nausea and vomiting, or diarrhea that is not controlled by your medication, call the clinic.  The clinic phone number is [phone]. Office hours are Monday-Friday 8:30am-5:00pm.  BELOW ARE SYMPTOMS THAT SHOULD BE REPORTED IMMEDIATELY:  *FEVER GREATER THAN 101.0 F  *CHILLS WITH OR WITHOUT FEVER  NAUSEA AND VOMITING THAT IS NOT CONTROLLED WITH YOUR NAUSEA MEDICATION  *UNUSUAL SHORTNESS OF BREATH  *UNUSUAL BRUISING OR BLEEDING  TENDERNESS IN MOUTH AND THROAT WITH OR WITHOUT PRESENCE OF ULCERS  *URINARY PROBLEMS  *BOWEL PROBLEMS  UNUSUAL RASH Items with * indicate a potential emergency and should be followed up as soon as possible. If you have an emergency after office hours please contact your primary care physician or go to the nearest emergency department.  Please call the clinic during office hours if you have any questions or concerns.   You may also contact the Patient Navigator at [phone] should you have any questions or need assistance in obtaining follow up care.      Resources For Cancer Patients and their Caregivers ? American Cancer Society: Can assist with transportation, wigs, general needs, runs Look Good Feel Better.        1-888-227-6333 ? Cancer Care: Provides financial assistance, online support groups, medication/co-pay assistance.  1-800-813-HOPE (4673) ? [facility] Assists [place] cancer patients and their families through emotional , educational and financial support.   336-427-4357 ? [place] DSS Where to apply for food stamps, Medicaid and utility assistance. 336-342-1394 ? RC[employer]: Transportation to medical appointments. 336-347-2287 ? Social Security Administration: May apply for disability if have a Stage IV cancer. [phone] 1-800-772-1213 ? [place] Aging, Disability and Transit Services: Assists with nutrition, care and transit needs. [phone]         

## 2015-10-10 NOTE — Telephone Encounter (Signed)
Routing to Dr Luna Glasgow for review

## 2015-10-11 DIAGNOSIS — M1711 Unilateral primary osteoarthritis, right knee: Secondary | ICD-10-CM | POA: Diagnosis not present

## 2015-10-11 NOTE — Telephone Encounter (Signed)
Called back to patient; phone # ringing busy.  Called primary alternate contact, sister, no answer or answer machine.

## 2015-10-16 NOTE — Telephone Encounter (Signed)
Unable to reach patient to relay Dr Brooke Bonito response.  (Routing note back to clinic where initial note appeared, [facility] oncology.  Barry note appears in same telephone note encounter.)

## 2015-10-22 ENCOUNTER — Telehealth: Payer: Self-pay | Admitting: Family Medicine

## 2015-10-22 MED ORDER — AMLODIPINE-OLMESARTAN 10-40 MG PO TABS: 1.0000 | ORAL_TABLET | Freq: Every day | ORAL | Status: DC

## 2015-10-22 NOTE — Telephone Encounter (Signed)
[name] is asking for a refill on her amLODipine-olmesartan (AZOR) 10-40 MG tablet called to [facility] no longer using [facility], She is also asking for Hydrocodone Refill for Knee Pain

## 2015-10-22 NOTE — Telephone Encounter (Signed)
Med sent to [facility] and hydrocodone is ready to be collected

## 2015-10-24 ENCOUNTER — Other Ambulatory Visit: Payer: Self-pay

## 2015-10-24 ENCOUNTER — Encounter (HOSPITAL_COMMUNITY): Payer: Medicare Other | Attending: Hematology and Oncology

## 2015-10-24 ENCOUNTER — Encounter (HOSPITAL_COMMUNITY): Payer: Medicare Other

## 2015-10-24 DIAGNOSIS — C252 Malignant neoplasm of tail of pancreas: Secondary | ICD-10-CM | POA: Insufficient documentation

## 2015-10-24 DIAGNOSIS — C259 Malignant neoplasm of pancreas, unspecified: Secondary | ICD-10-CM

## 2015-10-24 DIAGNOSIS — C801 Malignant (primary) neoplasm, unspecified: Secondary | ICD-10-CM | POA: Diagnosis not present

## 2015-10-24 DIAGNOSIS — C787 Secondary malignant neoplasm of liver and intrahepatic bile duct: Secondary | ICD-10-CM | POA: Insufficient documentation

## 2015-10-24 LAB — CBC
HEMATOCRIT: 30.3 % — AB (ref 36.0–46.0)
HEMOGLOBIN: 10 g/dL — AB (ref 12.0–15.0)
MCH: 28.9 pg (ref 26.0–34.0)
MCHC: 33 g/dL (ref 30.0–36.0)
MCV: 87.6 fL (ref 78.0–100.0)
Platelets: 178 10*3/uL (ref 150–400)
RBC: 3.46 MIL/uL — AB (ref 3.87–5.11)
RDW: 18.9 % — AB (ref 11.5–15.5)
WBC: 5.5 10*3/uL (ref 4.0–10.5)

## 2015-10-24 MED ORDER — AMLODIPINE BESYLATE 10 MG PO TABS: 10.0000 mg | ORAL_TABLET | Freq: Every day | ORAL | Status: DC

## 2015-10-24 NOTE — Progress Notes (Signed)
Aranesp held per MD Hemoblobin within normal limits

## 2015-10-31 ENCOUNTER — Encounter (HOSPITAL_BASED_OUTPATIENT_CLINIC_OR_DEPARTMENT_OTHER): Payer: Medicare Other | Admitting: Oncology

## 2015-10-31 ENCOUNTER — Encounter (HOSPITAL_BASED_OUTPATIENT_CLINIC_OR_DEPARTMENT_OTHER): Payer: Medicare Other

## 2015-10-31 ENCOUNTER — Encounter (HOSPITAL_COMMUNITY): Payer: Self-pay | Admitting: Oncology

## 2015-10-31 VITALS — BP 137/53 | HR 56 | Temp 97.8°F | Resp 18 | Wt 140.6 lb

## 2015-10-31 VITALS — BP 146/56 | HR 60 | Temp 97.5°F | Resp 18

## 2015-10-31 DIAGNOSIS — C801 Malignant (primary) neoplasm, unspecified: Secondary | ICD-10-CM | POA: Diagnosis not present

## 2015-10-31 DIAGNOSIS — C259 Malignant neoplasm of pancreas, unspecified: Secondary | ICD-10-CM

## 2015-10-31 DIAGNOSIS — D6481 Anemia due to antineoplastic chemotherapy: Secondary | ICD-10-CM

## 2015-10-31 DIAGNOSIS — C787 Secondary malignant neoplasm of liver and intrahepatic bile duct: Secondary | ICD-10-CM

## 2015-10-31 DIAGNOSIS — C252 Malignant neoplasm of tail of pancreas: Secondary | ICD-10-CM | POA: Diagnosis not present

## 2015-10-31 DIAGNOSIS — Z5111 Encounter for antineoplastic chemotherapy: Secondary | ICD-10-CM

## 2015-10-31 DIAGNOSIS — T451X5A Adverse effect of antineoplastic and immunosuppressive drugs, initial encounter: Secondary | ICD-10-CM

## 2015-10-31 LAB — COMPREHENSIVE METABOLIC PANEL
ALBUMIN: 3.6 g/dL (ref 3.5–5.0)
ALK PHOS: 71 U/L (ref 38–126)
ALT: 21 U/L (ref 14–54)
ANION GAP: 4 — AB (ref 5–15)
AST: 18 U/L (ref 15–41)
BILIRUBIN TOTAL: 0.7 mg/dL (ref 0.3–1.2)
BUN: 17 mg/dL (ref 6–20)
CALCIUM: 8.4 mg/dL — AB (ref 8.9–10.3)
CO2: 25 mmol/L (ref 22–32)
Chloride: 110 mmol/L (ref 101–111)
Creatinine, Ser: 0.82 mg/dL (ref 0.44–1.00)
GFR calc non Af Amer: >60 mL/min (ref >60)
Glucose, Bld: 99 mg/dL (ref 65–99)
POTASSIUM: 3.5 mmol/L (ref 3.5–5.1)
Sodium: 139 mmol/L (ref 135–145)
TOTAL PROTEIN: 6.4 g/dL — AB (ref 6.5–8.1)

## 2015-10-31 LAB — CBC WITH DIFFERENTIAL/PLATELET
BASOS PCT: 0 %
Basophils Absolute: 0 10*3/uL (ref 0.0–0.1)
EOS ABS: 0.1 10*3/uL (ref 0.0–0.7)
Eosinophils Relative: 1 %
HEMATOCRIT: 31.6 % — AB (ref 36.0–46.0)
HEMOGLOBIN: 10.3 g/dL — AB (ref 12.0–15.0)
LYMPHS ABS: 1.3 10*3/uL (ref 0.7–4.0)
Lymphocytes Relative: 17 %
MCH: 28.5 pg (ref 26.0–34.0)
MCHC: 32.6 g/dL (ref 30.0–36.0)
MCV: 87.5 fL (ref 78.0–100.0)
MONO ABS: 0.7 10*3/uL (ref 0.1–1.0)
MONOS PCT: 10 %
NEUTROS ABS: 5.3 10*3/uL (ref 1.7–7.7)
NEUTROS PCT: 72 %
Platelets: 207 10*3/uL (ref 150–400)
RBC: 3.61 MIL/uL — ABNORMAL LOW (ref 3.87–5.11)
RDW: 18.3 % — AB (ref 11.5–15.5)
WBC: 7.4 10*3/uL (ref 4.0–10.5)

## 2015-10-31 MED ORDER — HEPARIN SOD (PORK) LOCK FLUSH 100 UNIT/ML IV SOLN
500.0000 [IU] | Freq: Once | INTRAVENOUS | Status: AC | PRN
Start: 2015-10-31 — End: 2015-10-31

## 2015-10-31 MED ORDER — SODIUM CHLORIDE 0.9 % IV SOLN
Freq: Once | INTRAVENOUS | Status: AC
  Administered 2015-10-31: 11:00:00 via INTRAVENOUS

## 2015-10-31 MED ORDER — SODIUM CHLORIDE 0.9 % IJ SOLN: 10.0000 mL | INTRAMUSCULAR | Status: DC | PRN

## 2015-10-31 MED ORDER — HEPARIN SOD (PORK) LOCK FLUSH 100 UNIT/ML IV SOLN
500.0000 [IU] | Freq: Once | INTRAVENOUS | Status: AC
  Administered 2015-10-31: 500 [IU] via INTRAVENOUS

## 2015-10-31 MED ORDER — PACLITAXEL PROTEIN-BOUND CHEMO INJECTION 100 MG
125.0000 mg/m2 | Freq: Once | INTRAVENOUS | Status: AC
  Administered 2015-10-31: 225 mg via INTRAVENOUS
  Filled 2015-10-31: qty 45

## 2015-10-31 MED ORDER — SODIUM CHLORIDE 0.9 % IV SOLN
1000.0000 mg/m2 | Freq: Once | INTRAVENOUS | Status: AC
  Administered 2015-10-31: 1786 mg via INTRAVENOUS
  Filled 2015-10-31: qty 46.97

## 2015-10-31 MED ORDER — SODIUM CHLORIDE 0.9 % IV SOLN
Freq: Once | INTRAVENOUS | Status: AC
  Administered 2015-10-31: 8 mg via INTRAVENOUS
  Filled 2015-10-31: qty 4

## 2015-10-31 NOTE — Assessment & Plan Note (Addendum)
Chemotherapy-induced anemia, on Aranesp 500 mcg every 21 days beginning on 09/12/2015.  Hemoglobin today is 10.3 g/dL. Aranesp with be HELD today for a HGB > 10 g/dL.

## 2015-10-31 NOTE — Progress Notes (Signed)
[name] Nakayama, MD [address] [place] [address]  Pancreatic cancer metastasized to liver [facility])  Anemia due to antineoplastic chemotherapy  CURRENT THERAPY: Palliative Gemcitabine/Abraxane  INTERVAL HISTORY: [name] 82 y.o. female returns for followup of Stage IV pancreatic cancer.    Pancreatic cancer metastasized to liver ([place])   03/10/2014 Initial Diagnosis Pancreatic cancer metastasized to liver   03/22/2014 -  Chemotherapy Abraxane/Gemzar days 1, 8, every 28 days.  Day 15 was cancelled due to leukopenia and thrombocytopenia on day 15 cycle 1.   05/24/2014 Treatment Plan Change Day 8 of cycle 3 is held with ANC of 1.1   06/05/2014 Imaging CT C/A/P . Interval decrease in size of the pancreatic tail mass.Improved hepatic metastatic disease. No new lesions. No CT findings for metastatic disease involving the chest.   08/09/2014 Tumor Marker CA 19-9= 33 (WNL)   10/26/2014 Imaging MRI- Continued interval decrease in size of the hepatic metastatic lesions and no new lesions are identified. Continued decrease in size of the pancreatic tail lesion.   01/03/2015 Tumor Marker Results for ARABELL, HOVORKA (MRN [id number]) as of 01/18/2015 12:52  01/03/2015 10:00 CA 19-9: 14    01/17/2015 Imaging MRI- Response to therapy of hepatic metastasis.  Similar size of a pancreatic tail lesion.   03/20/2015 Imaging MRI-L spine- Severe disc space narrowing at L2-L3, with endplate reactive changes. Large disc extrusion into the ventral epidural space,central to the RIGHT with a cephalad migrated free fragment. Additional Large disc extrusion into the retroperitone...   03/22/2015 Imaging CT pelvis- No evidence for metastatic disease within the pelvis.   07/24/2015 Imaging MRI abd- Continued response to therapy, with no residual detectable liver metastases. No new sites of metastatic disease in the abdomen.    08/15/2015 Code Status  She confirms desire for DNR status.   She is  doing very well from an oncology standpoint. Her weight is up 2 pounds compared to 10/03/2015. She weighs in today at 140.6 pounds. She is utilizing nutritional supplementation accordingly and is very compliant with this.  She notes continued knee pain. This is being followed by orthopedics.  She knows that her bowels fluctuate between constipation, normal, and loose stools. She associates this with chemotherapy.  Review of Systems  Constitutional: Negative for fever, chills and weight loss (Weight is up 2 lbs).  HENT: Negative.  Negative for congestion and sore throat.   Eyes: Negative.  Negative for blurred vision and double vision.  Respiratory: Negative.  Negative for cough, sputum production and wheezing.   Cardiovascular: Negative.  Negative for chest pain.  Gastrointestinal: Positive for diarrhea (Fluctates from diarrhea, normal, and constipation.) and constipation (Fluctates from constipation normal, and diarrhea). Negative for nausea, vomiting, abdominal pain, blood in stool and melena.  Genitourinary: Negative.  Negative for dysuria, frequency and hematuria.  Musculoskeletal: Positive for back pain and joint pain (Knee pain followed by Dr. Luna Glasgow). Negative for falls.  Skin: Negative.   Neurological: Negative.  Negative for headaches.  Endo/Heme/Allergies: Negative.   Psychiatric/Behavioral: Negative.     Past Medical History  Diagnosis Date  . Hypertension 20 years   . Chronic diarrhea   . Anxiety   . Depression   . Kidney stone     hx/ crushed   . Hx of adenomatous colonic polyps     tubular adenomas, last found in 2008  . GERD (gastroesophageal reflux disease)   . Erosive esophagitis   . Schatzki's ring 08/26/10  Last dilated on EGD by Dr. Trevor Iha [facility], linear gastric erosions, BI hemigastrectomy  . Hyperplastic colon polyp 03/19/10    tcs by Dr. Gala Romney  . Pancreatic cancer ([place]) 02/2014  . Pancreatic cancer metastasized to liver ([place]) 03/10/2014  . Opioid  contract exists 04/18/2015    With Dr. Moshe Cipro  . DNR (do not resuscitate) 08/16/2015  . Anemia due to antineoplastic chemotherapy 09/12/2015    Started Aranesp 500 mcg on 09/12/2015    Past Surgical History  Procedure Laterality Date  . Stomach ulcer  50 years ago     had some of her stomach removed   . Bunion removal      from both feet   . Left shoulder surgery  2009    DR HARRISON  . Colonoscopy  03/19/2010    DR Gala Romney,, normal TI, pancolonic diverticula, random colon bx neg., hyperplastic polyps removed  . Esophagogastroduodenoscopy  11/22/2003    DR Gala Romney, erosive RE, Billroth I  . Esophagogastroduodenoscopy  08/26/10    Dr. Gala Romney- moderate severe ERE, Scahtzki ring s/p dilation, Billroth I, linear gastric erosions, bx-gastric xanthelasma  . Breast lumpectomy Left   . Orif ankle fracture Right 05/22/2013    Procedure: OPEN REDUCTION INTERNAL FIXATION (ORIF) RIGHT ANKLE FRACTURE;  Surgeon: Sanjuana Kava, MD;  Location: [facility];  Service: Orthopedics;  Laterality: Right;  . Cholecystectomy  1965     Family History  Problem Relation Age of Onset  . Hypertension Mother   . Kidney failure Brother     X1 ON DIALYSIS  . Diabetes Sister   . Cancer Sister     X2  . Hypertension Father   . Liver disease Neg Hx   . Colon cancer Neg Hx     Social History   Social History  . Marital Status: Widowed    Spouse Name: N/A  . Number of Children: 2  . Years of Education: N/A   Occupational History  . retired from Teacher, adult education    Social History Main Topics  . Smoking status: Former Smoker    Types: Cigarettes    Quit date: 11/09/2011  . Smokeless tobacco: Never Used     Comment: quit a few weeks ago   . Alcohol Use: No  . Drug Use: No  . Sexual Activity: No   Other Topics Concern  . None   Social History Narrative     PHYSICAL EXAMINATION  ECOG PERFORMANCE STATUS: 1 - Symptomatic but completely ambulatory  Filed Vitals:   10/31/15 1003  BP: 137/53  Pulse: 56  Temp: 97.8 F  (36.6 C)  Resp: 18     GENERAL:alert, no distress, comfortable, cooperative, smiling and unaccompanied SKIN: skin color, texture, turgor are normal, no rashes or significant lesions. HEAD: Normocephalic, No masses, lesions, tenderness or abnormalities EYES: normal, EOMI, Conjunctiva are pink and non-injected EARS: External ears normal OROPHARYNX:mucous membranes are moist  NECK: supple, no adenopathy, trachea midline LYMPH:  not examined BREAST:not examined LUNGS: clear to auscultation and percussion without wheezes, rales, or rhonchi.  HEART: regular rate & rhythm, no murmurs, no gallops, S1 normal and S2 normal ABDOMEN:abdomen soft, non-tender and normal bowel sounds BACK: Back symmetric, no curvature. EXTREMITIES:less then 2 second capillary refill, no joint deformities, effusion, or inflammation, no edema, no skin discoloration, no clubbing, no cyanosis  NEURO: alert & oriented x 3 with fluent speech, no focal motor/sensory deficits, gait normal   LABORATORY DATA: CBC    Component Value Date/Time   WBC 7.4 10/31/2015 1009  RBC 3.61* 10/31/2015 1009   RBC 3.39* 08/15/2015 1300   HGB 10.3* 10/31/2015 1009   HCT 31.6* 10/31/2015 1009   PLT 207 10/31/2015 1009   MCV 87.5 10/31/2015 1009   MCH 28.5 10/31/2015 1009   MCHC 32.6 10/31/2015 1009   RDW 18.3* 10/31/2015 1009   LYMPHSABS 1.3 10/31/2015 1009   MONOABS 0.7 10/31/2015 1009   EOSABS 0.1 10/31/2015 1009   BASOSABS 0.0 10/31/2015 1009      Chemistry      Component Value Date/Time   NA 139 10/31/2015 1009   K 3.5 10/31/2015 1009   CL 110 10/31/2015 1009   CO2 25 10/31/2015 1009   BUN 17 10/31/2015 1009   CREATININE 0.82 10/31/2015 1009   CREATININE 0.97 02/14/2014 0758      Component Value Date/Time   CALCIUM 8.4* 10/31/2015 1009   ALKPHOS 71 10/31/2015 1009   AST 18 10/31/2015 1009   ALT 21 10/31/2015 1009   BILITOT 0.7 10/31/2015 1009     Results for TATEUM, ROBERTSON (MRN [id number]) as of 10/31/2015  07:58  Ref. Range 05/16/2015 10:00 06/13/2015 09:29 07/11/2015 09:15 09/05/2015 10:24 10/03/2015 09:24  CA 19-9 Latest Ref Range: 0-35 U/mL 14 17 13 14 23     PENDING LABS:   RADIOGRAPHIC STUDIES:  No results found.   PATHOLOGY:    ASSESSMENT AND PLAN:  Pancreatic cancer metastasized to liver Stage IV Pancreatic Cancer, on systemic therapy with Abraxane and Gemzar in a day 1, 8 fashion every 28 days.  Continued response to therapy.  Oncology history is up to date.   Patient has entered into a pain contract with her primary care provider for non-oncologic pain.  She has been followed by Dr. Luna Glasgow (Ortho) for her knee and ankle pain.  She is S/P corticosteroid injection by Dr. Luna Glasgow on 09/27/2015.  She continues with knee pain and I'll defer this to orthopedics.  Pre-chemo labs as ordered: CBC diff, CMET, CA 19-9.   I personally reviewed and went over laboratory results with the patient.  The results are noted within this dictation.  Of note, her CA19-9, although WNL, increased with her last test in May.  Will follow the CA 19-9 trend accordingly, but no major decision making is required at this time.  We will update the value today and further medical oncology recommendations may follow.  Given her long time on treatment, if her QOL were to be affected from chemotherapy, a holiday may be indicated.  We will continue to monitor for side effects of therapy and her QOL.  DNR  Return in 4 weeks for follow-up.  No imaging studies ordered/scheduled at this time and will be addressed in the future.  If CA19-9 increases, will order imaging for restaging purposes.  Anemia due to antineoplastic chemotherapy Chemotherapy-induced anemia, on Aranesp 500 mcg every 21 days beginning on 09/12/2015.  Hemoglobin today is 10.3 g/dL. Aranesp with be HELD today for a HGB > 10 g/dL.    ORDERS PLACED FOR THIS ENCOUNTER: No orders of the defined types were placed in this encounter.    MEDICATIONS  PRESCRIBED THIS ENCOUNTER: No orders of the defined types were placed in this encounter.    THERAPY PLAN:  Continue treatment as planned.  All questions were answered. The patient knows to call the clinic with any problems, questions or concerns. We can certainly see the patient much sooner if necessary.  Patient and plan discussed with Dr. Ancil Linsey and she is in agreement with the aforementioned.  This note is electronically signed by: Doy Mince 10/31/2015 12:14 PM

## 2015-10-31 NOTE — Patient Instructions (Signed)
[place] at [facility] Discharge Instructions  RECOMMENDATIONS MADE BY THE CONSULTANT AND ANY TEST RESULTS WILL BE SENT TO YOUR REFERRING PHYSICIAN.  Exam done and seen today by Kirby Crigler Treatment today Aranesp today Return to see the Doctor in 4 weeks  Call the clinic for any concerns or questions.  Thank you for choosing [place] at [facility] to provide your oncology and hematology care.  To afford each patient quality time with our provider, please arrive at least 15 minutes before your scheduled appointment time.   Beginning January 23rd 2017 lab work for the [name] will be done in the  Main lab at [facility] on 1st floor. If you have a lab appointment with the [facility] please come in thru the  Main Entrance and check in at the main information desk  You need to re-schedule your appointment should you arrive 10 or more minutes late.  We strive to give you quality time with our providers, and arriving late affects you and other patients whose appointments are after yours.  Also, if you no show three or more times for appointments you may be dismissed from the clinic at the providers discretion.     Again, thank you for choosing [facility].  Our hope is that these requests will decrease the amount of time that you wait before being seen by our physicians.       _____________________________________________________________  Should you have questions after your visit to [facility], please contact our office at [phone] between the hours of 8:30 a.m. and 4:30 p.m.  Voicemails left after 4:30 p.m. will not be returned until the following business day.  For prescription refill requests, have your pharmacy contact our office.         Resources For Cancer Patients and their Caregivers ? American Cancer Society: Can assist with transportation, wigs, general needs, runs Look Good Feel Better.         [phone] ? Cancer Care: Provides financial assistance, online support groups, medication/co-pay assistance.  1-800-813-HOPE (254)249-7649) ? [place] Assists [place] cancer patients and their families through emotional , educational and financial support.  (847)431-5046 ? [place] DSS Where to apply for food stamps, Medicaid and utility assistance. (206) 123-3728 ? RCATS: Transportation to medical appointments. (959)076-3584 ? Social Security Administration: May apply for disability if have a Stage IV cancer. 587-845-9522 516-162-3860 ? [employer], Disability and Transit Services: Assists with nutrition, care and transit needs. [place] Support Programs: @10RELATIVEDAYS @ > Cancer Support Group  2nd Tuesday of the month 1pm-2pm, Journey Room  > Creative Journey  3rd Tuesday of the month 1130am-1pm, Journey Room  > Look Good Feel Better  1st Wednesday of the month 10am-12 noon, Journey Room (Call [place] to register [phone])

## 2015-10-31 NOTE — Assessment & Plan Note (Addendum)
Stage IV Pancreatic Cancer, on systemic therapy with Abraxane and Gemzar in a day 1, 8 fashion every 28 days.  Continued response to therapy.  Oncology history is up to date.   Patient has entered into a pain contract with her primary care provider for non-oncologic pain.  She has been followed by Dr. Luna Glasgow (Ortho) for her knee and ankle pain.  She is S/P corticosteroid injection by Dr. Luna Glasgow on 09/27/2015.  She continues with knee pain and I'll defer this to orthopedics.  Pre-chemo labs as ordered: CBC diff, CMET, CA 19-9.   I personally reviewed and went over laboratory results with the patient.  The results are noted within this dictation.  Of note, her CA19-9, although WNL, increased with her last test in May.  Will follow the CA 19-9 trend accordingly, but no major decision making is required at this time.  We will update the value today and further medical oncology recommendations may follow.  Given her long time on treatment, if her QOL were to be affected from chemotherapy, a holiday may be indicated.  We will continue to monitor for side effects of therapy and her QOL.  DNR  Return in 4 weeks for follow-up.  No imaging studies ordered/scheduled at this time and will be addressed in the future.  If CA19-9 increases, will order imaging for restaging purposes.

## 2015-10-31 NOTE — Progress Notes (Signed)
Tolerated tx w/o adverse reaction.  A&Ox4, in no distress.  VSS.  Discharged ambulatory.  

## 2015-10-31 NOTE — Patient Instructions (Signed)
[facility] Discharge Instructions for Patients Receiving Chemotherapy   Beginning January 23rd 2017 lab work for the Cancer Center will be done in the  Main lab at [facility] on 1st floor. If you have a lab appointment with the Cancer Center please come in thru the  Main Entrance and check in at the main information desk   Today you received the following chemotherapy agents:  Abraxane and Gemzar.  If you develop nausea and vomiting, or diarrhea that is not controlled by your medication, call the clinic.  The clinic phone number is [phone]. Office hours are Monday-Friday 8:30am-5:00pm.  BELOW ARE SYMPTOMS THAT SHOULD BE REPORTED IMMEDIATELY:  *FEVER GREATER THAN 101.0 F  *CHILLS WITH OR WITHOUT FEVER  NAUSEA AND VOMITING THAT IS NOT CONTROLLED WITH YOUR NAUSEA MEDICATION  *UNUSUAL SHORTNESS OF BREATH  *UNUSUAL BRUISING OR BLEEDING  TENDERNESS IN MOUTH AND THROAT WITH OR WITHOUT PRESENCE OF ULCERS  *URINARY PROBLEMS  *BOWEL PROBLEMS  UNUSUAL RASH Items with * indicate a potential emergency and should be followed up as soon as possible. If you have an emergency after office hours please contact your primary care physician or go to the nearest emergency department.  Please call the clinic during office hours if you have any questions or concerns.   You may also contact the Patient Navigator at [phone] should you have any questions or need assistance in obtaining follow up care.      Resources For Cancer Patients and their Caregivers ? American Cancer Society: Can assist with transportation, wigs, general needs, runs Look Good Feel Better.        1-888-227-6333 ? Cancer Care: Provides financial assistance, online support groups, medication/co-pay assistance.  1-800-813-HOPE (4673) ? [facility] Assists [place] cancer patients and their families through emotional , educational and financial support.   336-427-4357 ? [place] DSS Where to apply for food stamps, Medicaid and utility assistance. 336-342-1394 ? RCATS: Transportation to medical appointments. 336-347-2287 ? Social Security Administration: May apply for disability if have a Stage IV cancer. 336-342-7796 1-800-772-1213 ? [place] Aging, Disability and Transit Services: Assists with nutrition, care and transit needs. [phone]         

## 2015-11-01 LAB — CANCER ANTIGEN 19-9: CA 19-9: 21 U/mL (ref 0–35)

## 2015-11-07 ENCOUNTER — Inpatient Hospital Stay (HOSPITAL_COMMUNITY): Payer: Medicare Other

## 2015-11-07 ENCOUNTER — Encounter (HOSPITAL_BASED_OUTPATIENT_CLINIC_OR_DEPARTMENT_OTHER): Payer: Medicare Other

## 2015-11-07 VITALS — BP 151/65 | HR 55 | Temp 97.6°F | Resp 18 | Wt 141.0 lb

## 2015-11-07 DIAGNOSIS — C252 Malignant neoplasm of tail of pancreas: Secondary | ICD-10-CM | POA: Diagnosis not present

## 2015-11-07 DIAGNOSIS — C801 Malignant (primary) neoplasm, unspecified: Secondary | ICD-10-CM | POA: Diagnosis not present

## 2015-11-07 DIAGNOSIS — D6481 Anemia due to antineoplastic chemotherapy: Secondary | ICD-10-CM

## 2015-11-07 DIAGNOSIS — C259 Malignant neoplasm of pancreas, unspecified: Secondary | ICD-10-CM

## 2015-11-07 DIAGNOSIS — C787 Secondary malignant neoplasm of liver and intrahepatic bile duct: Principal | ICD-10-CM

## 2015-11-07 DIAGNOSIS — T451X5A Adverse effect of antineoplastic and immunosuppressive drugs, initial encounter: Secondary | ICD-10-CM

## 2015-11-07 LAB — COMPREHENSIVE METABOLIC PANEL
ALBUMIN: 3.6 g/dL (ref 3.5–5.0)
ALT: 19 U/L (ref 14–54)
AST: 18 U/L (ref 15–41)
Alkaline Phosphatase: 65 U/L (ref 38–126)
Anion gap: 3 — ABNORMAL LOW (ref 5–15)
BUN: 16 mg/dL (ref 6–20)
CHLORIDE: 110 mmol/L (ref 101–111)
CO2: 27 mmol/L (ref 22–32)
CREATININE: 0.79 mg/dL (ref 0.44–1.00)
Calcium: 8.3 mg/dL — ABNORMAL LOW (ref 8.9–10.3)
GFR calc Af Amer: >60 mL/min (ref >60)
GFR calc non Af Amer: >60 mL/min (ref >60)
GLUCOSE: 96 mg/dL (ref 65–99)
POTASSIUM: 3.6 mmol/L (ref 3.5–5.1)
SODIUM: 140 mmol/L (ref 135–145)
Total Bilirubin: 0.7 mg/dL (ref 0.3–1.2)
Total Protein: 6.4 g/dL — ABNORMAL LOW (ref 6.5–8.1)

## 2015-11-07 LAB — CBC WITH DIFFERENTIAL/PLATELET
BASOS ABS: 0 10*3/uL (ref 0.0–0.1)
BASOS PCT: 0 %
EOS PCT: 0 %
Eosinophils Absolute: 0 10*3/uL (ref 0.0–0.7)
HCT: 28.3 % — ABNORMAL LOW (ref 36.0–46.0)
Hemoglobin: 9.4 g/dL — ABNORMAL LOW (ref 12.0–15.0)
LYMPHS PCT: 39 %
Lymphs Abs: 1.2 10*3/uL (ref 0.7–4.0)
MCH: 28.5 pg (ref 26.0–34.0)
MCHC: 33.2 g/dL (ref 30.0–36.0)
MCV: 85.8 fL (ref 78.0–100.0)
MONO ABS: 0.2 10*3/uL (ref 0.1–1.0)
Monocytes Relative: 6 %
Neutro Abs: 1.7 10*3/uL (ref 1.7–7.7)
Neutrophils Relative %: 55 %
PLATELETS: 89 10*3/uL — AB (ref 150–400)
RBC: 3.3 MIL/uL — AB (ref 3.87–5.11)
RDW: 17.5 % — AB (ref 11.5–15.5)
WBC: 3.2 10*3/uL — AB (ref 4.0–10.5)

## 2015-11-07 MED ORDER — DARBEPOETIN ALFA 500 MCG/ML IJ SOSY
500.0000 ug | PREFILLED_SYRINGE | Freq: Once | INTRAMUSCULAR | Status: AC
Start: 2015-11-07 — End: 2015-11-07
  Administered 2015-11-07: 500 ug via SUBCUTANEOUS
  Filled 2015-11-07: qty 1

## 2015-11-07 MED ORDER — HEPARIN SOD (PORK) LOCK FLUSH 100 UNIT/ML IV SOLN
500.0000 [IU] | Freq: Once | INTRAVENOUS | Status: AC
  Administered 2015-11-07: 500 [IU] via INTRAVENOUS

## 2015-11-07 NOTE — Patient Instructions (Signed)
[place] at [facility] Discharge Instructions  RECOMMENDATIONS MADE BY THE CONSULTANT AND ANY TEST RESULTS WILL BE SENT TO YOUR REFERRING PHYSICIAN.  Chemotherapy held today - will recheck lab work next week.  If all your blood work is good, we will give your treatment at that time. Please begin taking over the counter Calcium + Vitamin D - 1200 mg/1000 mg daily. Call the clinic should you have any questions or concerns prior to your next visit.   Thank you for choosing [place] at [facility] to provide your oncology and hematology care.  To afford each patient quality time with our provider, please arrive at least 15 minutes before your scheduled appointment time.   Beginning January 23rd 2017 lab work for the [name] will be done in the  Main lab at [facility] on 1st floor. If you have a lab appointment with the [facility] please come in thru the  Main Entrance and check in at the main information desk  You need to re-schedule your appointment should you arrive 10 or more minutes late.  We strive to give you quality time with our providers, and arriving late affects you and other patients whose appointments are after yours.  Also, if you no show three or more times for appointments you may be dismissed from the clinic at the providers discretion.     Again, thank you for choosing [facility].  Our hope is that these requests will decrease the amount of time that you wait before being seen by our physicians.       _____________________________________________________________  Should you have questions after your visit to [facility], please contact our office at (336) [phone] between the hours of 8:30 a.m. and 4:30 p.m.  Voicemails left after 4:30 p.m. will not be returned until the following business day.  For prescription refill requests, have your pharmacy contact our office.         Resources For  Cancer Patients and their Caregivers ? American Cancer Society: Can assist with transportation, wigs, general needs, runs Look Good Feel Better.        ([phone] ? Cancer Care: Provides financial assistance, online support groups, medication/co-pay assistance.  1-800-813-HOPE 814-620-7885) ? [place] Assists [place] cancer patients and their families through emotional , educational and financial support.  2543663438 ? [place] DSS Where to apply for food stamps, Medicaid and utility assistance. 440-535-4184 ? RCATS: Transportation to medical appointments. [phone] ? Social Security Administration: May apply for disability if have a Stage IV cancer. 779-267-9568 414-414-6881 ? [employer], Disability and Transit Services: Assists with nutrition, care and transit needs. [place] Support Programs: @10RELATIVEDAYS @ > Cancer Support Group  2nd Tuesday of the month 1pm-2pm, Journey Room  > Creative Journey  3rd Tuesday of the month 1130am-1pm, Journey Room  > Look Good Feel Better  1st Wednesday of the month 10am-12 noon, Journey Room (Call [place] to register [phone])

## 2015-11-07 NOTE — Progress Notes (Signed)
Caroleen Hamman, PA notified of platelet count of 89,000.  Will defer tx x 1 week per PA.

## 2015-11-13 ENCOUNTER — Other Ambulatory Visit: Payer: Self-pay | Admitting: Family Medicine

## 2015-11-14 ENCOUNTER — Ambulatory Visit (HOSPITAL_COMMUNITY): Payer: Medicare Other

## 2015-11-14 ENCOUNTER — Other Ambulatory Visit (HOSPITAL_COMMUNITY): Payer: Medicare Other

## 2015-11-15 ENCOUNTER — Other Ambulatory Visit (HOSPITAL_COMMUNITY): Payer: Medicare Other

## 2015-11-15 ENCOUNTER — Ambulatory Visit (HOSPITAL_COMMUNITY): Payer: Medicare Other

## 2015-11-16 ENCOUNTER — Other Ambulatory Visit: Payer: Self-pay

## 2015-11-16 ENCOUNTER — Telehealth: Payer: Self-pay | Admitting: Family Medicine

## 2015-11-16 ENCOUNTER — Encounter (HOSPITAL_COMMUNITY): Payer: Medicare Other | Attending: Hematology and Oncology

## 2015-11-16 ENCOUNTER — Encounter (HOSPITAL_COMMUNITY): Payer: Self-pay

## 2015-11-16 ENCOUNTER — Other Ambulatory Visit: Payer: Self-pay | Admitting: Family Medicine

## 2015-11-16 VITALS — BP 142/57 | HR 58 | Temp 98.0°F | Resp 18 | Wt 140.4 lb

## 2015-11-16 DIAGNOSIS — C252 Malignant neoplasm of tail of pancreas: Secondary | ICD-10-CM | POA: Insufficient documentation

## 2015-11-16 DIAGNOSIS — C801 Malignant (primary) neoplasm, unspecified: Secondary | ICD-10-CM | POA: Insufficient documentation

## 2015-11-16 DIAGNOSIS — Z5111 Encounter for antineoplastic chemotherapy: Secondary | ICD-10-CM

## 2015-11-16 DIAGNOSIS — C787 Secondary malignant neoplasm of liver and intrahepatic bile duct: Secondary | ICD-10-CM | POA: Insufficient documentation

## 2015-11-16 DIAGNOSIS — C259 Malignant neoplasm of pancreas, unspecified: Secondary | ICD-10-CM | POA: Diagnosis not present

## 2015-11-16 LAB — CBC WITH DIFFERENTIAL/PLATELET
BASOS ABS: 0 10*3/uL (ref 0.0–0.1)
BASOS PCT: 0 %
EOS ABS: 0.1 10*3/uL (ref 0.0–0.7)
EOS PCT: 1 %
HEMATOCRIT: 32.1 % — AB (ref 36.0–46.0)
Hemoglobin: 10.1 g/dL — ABNORMAL LOW (ref 12.0–15.0)
Lymphocytes Relative: 20 %
Lymphs Abs: 1.1 10*3/uL (ref 0.7–4.0)
MCH: 28.1 pg (ref 26.0–34.0)
MCHC: 31.5 g/dL (ref 30.0–36.0)
MCV: 89.4 fL (ref 78.0–100.0)
MONO ABS: 0.7 10*3/uL (ref 0.1–1.0)
MONOS PCT: 12 %
Neutro Abs: 3.7 10*3/uL (ref 1.7–7.7)
Neutrophils Relative %: 67 %
PLATELETS: 146 10*3/uL — AB (ref 150–400)
RBC: 3.59 MIL/uL — ABNORMAL LOW (ref 3.87–5.11)
RDW: 19.1 % — AB (ref 11.5–15.5)
WBC: 5.6 10*3/uL (ref 4.0–10.5)

## 2015-11-16 LAB — COMPREHENSIVE METABOLIC PANEL
ALBUMIN: 3.7 g/dL (ref 3.5–5.0)
ALT: 16 U/L (ref 14–54)
ANION GAP: 6 (ref 5–15)
AST: 16 U/L (ref 15–41)
Alkaline Phosphatase: 67 U/L (ref 38–126)
BILIRUBIN TOTAL: 0.9 mg/dL (ref 0.3–1.2)
BUN: 20 mg/dL (ref 6–20)
CALCIUM: 8.6 mg/dL — AB (ref 8.9–10.3)
CHLORIDE: 106 mmol/L (ref 101–111)
CO2: 27 mmol/L (ref 22–32)
Creatinine, Ser: 1.01 mg/dL — ABNORMAL HIGH (ref 0.44–1.00)
GFR calc Af Amer: 58 mL/min — ABNORMAL LOW (ref >60)
GFR calc non Af Amer: 50 mL/min — ABNORMAL LOW (ref >60)
GLUCOSE: 95 mg/dL (ref 65–99)
POTASSIUM: 3.7 mmol/L (ref 3.5–5.1)
SODIUM: 139 mmol/L (ref 135–145)
TOTAL PROTEIN: 6.5 g/dL (ref 6.5–8.1)

## 2015-11-16 MED ORDER — HYDROCODONE-ACETAMINOPHEN 5-325 MG PO TABS: ORAL_TABLET | ORAL | Status: DC

## 2015-11-16 MED ORDER — NEBIVOLOL HCL 10 MG PO TABS: 10.0000 mg | ORAL_TABLET | Freq: Every day | ORAL | Status: DC

## 2015-11-16 MED ORDER — PACLITAXEL PROTEIN-BOUND CHEMO INJECTION 100 MG
125.0000 mg/m2 | Freq: Once | INTRAVENOUS | Status: AC
  Administered 2015-11-16: 225 mg via INTRAVENOUS
  Filled 2015-11-16: qty 45

## 2015-11-16 MED ORDER — HEPARIN SOD (PORK) LOCK FLUSH 100 UNIT/ML IV SOLN
500.0000 [IU] | Freq: Once | INTRAVENOUS | Status: AC
  Administered 2015-11-16: 500 [IU] via INTRAVENOUS

## 2015-11-16 MED ORDER — HEPARIN SOD (PORK) LOCK FLUSH 100 UNIT/ML IV SOLN
500.0000 [IU] | Freq: Once | INTRAVENOUS | Status: AC | PRN
  Filled 2015-11-16: qty 5

## 2015-11-16 MED ORDER — SODIUM CHLORIDE 0.9 % IV SOLN
Freq: Once | INTRAVENOUS | Status: AC
  Administered 2015-11-16: 12:00:00 via INTRAVENOUS

## 2015-11-16 MED ORDER — SODIUM CHLORIDE 0.9 % IV SOLN
1000.0000 mg/m2 | Freq: Once | INTRAVENOUS | Status: AC
  Administered 2015-11-16: 1786 mg via INTRAVENOUS
  Filled 2015-11-16: qty 46.97

## 2015-11-16 MED ORDER — SODIUM CHLORIDE 0.9 % IJ SOLN: 10.0000 mL | INTRAMUSCULAR | Status: DC | PRN

## 2015-11-16 MED ORDER — SODIUM CHLORIDE 0.9 % IV SOLN
Freq: Once | INTRAVENOUS | Status: AC
  Administered 2015-11-16: 8 mg via INTRAVENOUS
  Filled 2015-11-16: qty 4

## 2015-11-16 NOTE — Telephone Encounter (Signed)
Patient is asking to refill her BYSTOLIC 10 MG tablet now to [facility] in [place] 90 day supply if all possible

## 2015-11-16 NOTE — Patient Instructions (Signed)
[facility] Discharge Instructions for Patients Receiving Chemotherapy   Beginning January 23rd 2017 lab work for the Cancer Center will be done in the  Main lab at [place] on 1st floor. If you have a lab appointment with the Cancer Center please come in thru the  Main Entrance and check in at the main information desk   Today you received the following chemotherapy agents:  Abraxane and Gemzar.  If you develop nausea and vomiting, or diarrhea that is not controlled by your medication, call the clinic.  The clinic phone number is [phone]. Office hours are Monday-Friday 8:30am-5:00pm.  BELOW ARE SYMPTOMS THAT SHOULD BE REPORTED IMMEDIATELY:  *FEVER GREATER THAN 101.0 F  *CHILLS WITH OR WITHOUT FEVER  NAUSEA AND VOMITING THAT IS NOT CONTROLLED WITH YOUR NAUSEA MEDICATION  *UNUSUAL SHORTNESS OF BREATH  *UNUSUAL BRUISING OR BLEEDING  TENDERNESS IN MOUTH AND THROAT WITH OR WITHOUT PRESENCE OF ULCERS  *URINARY PROBLEMS  *BOWEL PROBLEMS  UNUSUAL RASH Items with * indicate a potential emergency and should be followed up as soon as possible. If you have an emergency after office hours please contact your primary care physician or go to the nearest emergency department.  Please call the clinic during office hours if you have any questions or concerns.   You may also contact the Patient Navigator at [phone] should you have any questions or need assistance in obtaining follow up care.      Resources For Cancer Patients and their Caregivers ? American Cancer Society: Can assist with transportation, wigs, general needs, runs Look Good Feel Better.        1-888-227-6333 ? Cancer Care: Provides financial assistance, online support groups, medication/co-pay assistance.  1-800-813-HOPE (4673) ? [facility] Assists [place] cancer patients and their families through emotional , educational and financial support.   336-427-4357 ? [place] DSS Where to apply for food stamps, Medicaid and utility assistance. 336-342-1394 ? RCATS: Transportation to medical appointments. 336-347-2287 ? Social Security Administration: May apply for disability if have a Stage IV cancer. 336-342-7796 1-800-772-1213 ? [place] Aging, Disability and Transit Services: Assists with nutrition, care and transit needs. [phone]         

## 2015-11-16 NOTE — Progress Notes (Signed)
1325:  Tolerated tx w/o adverse reaction.  A&Ox4, in no distress; VSS. Discharged ambulatory in c/o son for transport home.

## 2015-11-16 NOTE — Telephone Encounter (Signed)
Medication refilled

## 2015-11-19 ENCOUNTER — Telehealth: Payer: Self-pay

## 2015-11-19 MED ORDER — METOPROLOL TARTRATE 25 MG PO TABS: 12.5000 mg | ORAL_TABLET | Freq: Two times a day (BID) | ORAL | Status: DC

## 2015-11-19 NOTE — Telephone Encounter (Signed)
Bystolic not covered. Alternatives include Atenolol and metoprolol succinate and metoprolol tartrate. Want to change?

## 2015-11-19 NOTE — Telephone Encounter (Signed)
I will change to metoprolol, explain situation too pt and fax in new order please  She also needs appt with me end August for re eval of her blood pressure on new medication , psl sched and let her know also

## 2015-11-20 ENCOUNTER — Other Ambulatory Visit: Payer: Self-pay

## 2015-11-20 MED ORDER — PANTOPRAZOLE SODIUM 20 MG PO TBEC: DELAYED_RELEASE_TABLET | ORAL | Status: DC

## 2015-11-20 NOTE — Telephone Encounter (Signed)
Pt aware and med sent in  

## 2015-11-21 ENCOUNTER — Encounter (HOSPITAL_COMMUNITY): Payer: Medicare Other

## 2015-11-21 DIAGNOSIS — C787 Secondary malignant neoplasm of liver and intrahepatic bile duct: Secondary | ICD-10-CM | POA: Diagnosis not present

## 2015-11-21 DIAGNOSIS — C259 Malignant neoplasm of pancreas, unspecified: Secondary | ICD-10-CM | POA: Diagnosis not present

## 2015-11-21 DIAGNOSIS — C252 Malignant neoplasm of tail of pancreas: Secondary | ICD-10-CM | POA: Diagnosis not present

## 2015-11-21 DIAGNOSIS — C801 Malignant (primary) neoplasm, unspecified: Secondary | ICD-10-CM | POA: Diagnosis not present

## 2015-11-21 LAB — CBC
HCT: 31 % — ABNORMAL LOW (ref 36.0–46.0)
HEMOGLOBIN: 10.1 g/dL — AB (ref 12.0–15.0)
MCH: 28.6 pg (ref 26.0–34.0)
MCHC: 32.6 g/dL (ref 30.0–36.0)
MCV: 87.8 fL (ref 78.0–100.0)
Platelets: 125 10*3/uL — ABNORMAL LOW (ref 150–400)
RBC: 3.53 MIL/uL — AB (ref 3.87–5.11)
RDW: 17.6 % — ABNORMAL HIGH (ref 11.5–15.5)
WBC: 3.7 10*3/uL — ABNORMAL LOW (ref 4.0–10.5)

## 2015-11-21 NOTE — Progress Notes (Signed)
Aranesp not given today due to hemoglobin within parameters to not be given

## 2015-11-26 DIAGNOSIS — M1711 Unilateral primary osteoarthritis, right knee: Secondary | ICD-10-CM | POA: Diagnosis not present

## 2015-11-28 ENCOUNTER — Ambulatory Visit (HOSPITAL_COMMUNITY): Payer: Medicare Other

## 2015-11-28 ENCOUNTER — Ambulatory Visit (HOSPITAL_COMMUNITY): Payer: Medicare Other | Admitting: Hematology & Oncology

## 2015-12-03 DIAGNOSIS — M1711 Unilateral primary osteoarthritis, right knee: Secondary | ICD-10-CM | POA: Diagnosis not present

## 2015-12-05 ENCOUNTER — Ambulatory Visit (HOSPITAL_COMMUNITY): Payer: Medicare Other

## 2015-12-07 ENCOUNTER — Encounter (HOSPITAL_BASED_OUTPATIENT_CLINIC_OR_DEPARTMENT_OTHER): Payer: Medicare Other | Admitting: Hematology & Oncology

## 2015-12-07 ENCOUNTER — Encounter (HOSPITAL_COMMUNITY): Payer: Medicare Other

## 2015-12-07 VITALS — BP 173/72 | HR 63 | Temp 98.4°F | Resp 18 | Wt 139.4 lb

## 2015-12-07 DIAGNOSIS — C259 Malignant neoplasm of pancreas, unspecified: Secondary | ICD-10-CM | POA: Diagnosis not present

## 2015-12-07 DIAGNOSIS — K5900 Constipation, unspecified: Secondary | ICD-10-CM | POA: Diagnosis not present

## 2015-12-07 DIAGNOSIS — C801 Malignant (primary) neoplasm, unspecified: Secondary | ICD-10-CM | POA: Diagnosis not present

## 2015-12-07 DIAGNOSIS — Z5111 Encounter for antineoplastic chemotherapy: Secondary | ICD-10-CM | POA: Diagnosis present

## 2015-12-07 DIAGNOSIS — D649 Anemia, unspecified: Secondary | ICD-10-CM | POA: Diagnosis not present

## 2015-12-07 DIAGNOSIS — R634 Abnormal weight loss: Secondary | ICD-10-CM

## 2015-12-07 DIAGNOSIS — D696 Thrombocytopenia, unspecified: Secondary | ICD-10-CM

## 2015-12-07 DIAGNOSIS — R197 Diarrhea, unspecified: Secondary | ICD-10-CM

## 2015-12-07 DIAGNOSIS — C252 Malignant neoplasm of tail of pancreas: Secondary | ICD-10-CM

## 2015-12-07 DIAGNOSIS — C787 Secondary malignant neoplasm of liver and intrahepatic bile duct: Secondary | ICD-10-CM | POA: Diagnosis not present

## 2015-12-07 LAB — DIFFERENTIAL
Basophils Absolute: 0 10*3/uL (ref 0.0–0.1)
Basophils Relative: 0 %
EOS PCT: 1 %
Eosinophils Absolute: 0.1 10*3/uL (ref 0.0–0.7)
LYMPHS PCT: 23 %
Lymphs Abs: 1.4 10*3/uL (ref 0.7–4.0)
Monocytes Absolute: 0.6 10*3/uL (ref 0.1–1.0)
Monocytes Relative: 11 %
NEUTROS ABS: 3.9 10*3/uL (ref 1.7–7.7)
NEUTROS PCT: 65 %

## 2015-12-07 LAB — CBC
HCT: 33.2 % — ABNORMAL LOW (ref 36.0–46.0)
Hemoglobin: 10.9 g/dL — ABNORMAL LOW (ref 12.0–15.0)
MCH: 28.4 pg (ref 26.0–34.0)
MCHC: 32.8 g/dL (ref 30.0–36.0)
MCV: 86.5 fL (ref 78.0–100.0)
PLATELETS: 209 10*3/uL (ref 150–400)
RBC: 3.84 MIL/uL — AB (ref 3.87–5.11)
RDW: 17.2 % — ABNORMAL HIGH (ref 11.5–15.5)
WBC: 6 10*3/uL (ref 4.0–10.5)

## 2015-12-07 LAB — COMPREHENSIVE METABOLIC PANEL
ALK PHOS: 78 U/L (ref 38–126)
ALT: 12 U/L — AB (ref 14–54)
AST: 16 U/L (ref 15–41)
Albumin: 4.1 g/dL (ref 3.5–5.0)
Anion gap: 7 (ref 5–15)
BILIRUBIN TOTAL: 0.7 mg/dL (ref 0.3–1.2)
BUN: 15 mg/dL (ref 6–20)
CALCIUM: 8.8 mg/dL — AB (ref 8.9–10.3)
CO2: 27 mmol/L (ref 22–32)
CREATININE: 0.9 mg/dL (ref 0.44–1.00)
Chloride: 105 mmol/L (ref 101–111)
GFR, EST NON AFRICAN AMERICAN: 57 mL/min — AB (ref >60)
Glucose, Bld: 98 mg/dL (ref 65–99)
Potassium: 3.3 mmol/L — ABNORMAL LOW (ref 3.5–5.1)
Sodium: 139 mmol/L (ref 135–145)
Total Protein: 6.9 g/dL (ref 6.5–8.1)

## 2015-12-07 MED ORDER — SODIUM CHLORIDE 0.9 % IV SOLN
Freq: Once | INTRAVENOUS | Status: AC
  Administered 2015-12-07: 11:00:00 via INTRAVENOUS

## 2015-12-07 MED ORDER — PACLITAXEL PROTEIN-BOUND CHEMO INJECTION 100 MG
125.0000 mg/m2 | Freq: Once | INTRAVENOUS | Status: AC
  Administered 2015-12-07: 225 mg via INTRAVENOUS
  Filled 2015-12-07: qty 45

## 2015-12-07 MED ORDER — SODIUM CHLORIDE 0.9 % IJ SOLN: 10.0000 mL | INTRAMUSCULAR | Status: DC | PRN

## 2015-12-07 MED ORDER — MEGESTROL ACETATE 400 MG/10ML PO SUSP: ORAL | 0 refills | Status: DC

## 2015-12-07 MED ORDER — HEPARIN SOD (PORK) LOCK FLUSH 100 UNIT/ML IV SOLN
500.0000 [IU] | Freq: Once | INTRAVENOUS | Status: AC | PRN
  Administered 2015-12-07: 500 [IU]

## 2015-12-07 MED ORDER — GEMCITABINE HCL CHEMO INJECTION 1 GM/26.3ML
1000.0000 mg/m2 | Freq: Once | INTRAVENOUS | Status: AC
  Administered 2015-12-07: 1786 mg via INTRAVENOUS
  Filled 2015-12-07: qty 46.97

## 2015-12-07 MED ORDER — SODIUM CHLORIDE 0.9 % IV SOLN
Freq: Once | INTRAVENOUS | Status: AC
  Administered 2015-12-07: 8 mg via INTRAVENOUS
  Filled 2015-12-07: qty 4

## 2015-12-07 NOTE — Patient Instructions (Addendum)
[place] at [facility] Discharge Instructions  RECOMMENDATIONS MADE BY THE CONSULTANT AND ANY TEST RESULTS WILL BE SENT TO YOUR REFERRING PHYSICIAN.  Chemotherapy today: abraxane and gemzar.   Please be sure to refill your potassium and start taking it.      Thank you for choosing [place] at [facility] to provide your oncology and hematology care.  To afford each patient quality time with our provider, please arrive at least 15 minutes before your scheduled appointment time.   Beginning January 23rd 2017 lab work for the [name] will be done in the  Main lab at [facility] on 1st floor. If you have a lab appointment with the [facility] please come in thru the  Main Entrance and check in at the main information desk  You need to re-schedule your appointment should you arrive 10 or more minutes late.  We strive to give you quality time with our providers, and arriving late affects you and other patients whose appointments are after yours.  Also, if you no show three or more times for appointments you may be dismissed from the clinic at the providers discretion.     Again, thank you for choosing [facility].  Our hope is that these requests will decrease the amount of time that you wait before being seen by our physicians.       _____________________________________________________________  Should you have questions after your visit to [facility], please contact our office at [phone] between the hours of 8:30 a.m. and 4:30 p.m.  Voicemails left after 4:30 p.m. will not be returned until the following business day.  For prescription refill requests, have your pharmacy contact our office.         Resources For Cancer Patients and their Caregivers ? American Cancer Society: Can assist with transportation, wigs, general needs, runs Look Good Feel Better.        3316447942 ? Cancer  Care: Provides financial assistance, online support groups, medication/co-pay assistance.  1-800-813-HOPE 386-450-0759) ? [place] Assists [place] cancer patients and their families through emotional , educational and financial support.  520-716-8369 ? [place] DSS Where to apply for food stamps, Medicaid and utility assistance. 828-726-2193 ? RCATS: Transportation to medical appointments. (304) 833-9776 ? Social Security Administration: May apply for disability if have a Stage IV cancer. [phone]) 463-0681 [phone] ? [employer], Disability and Transit Services: Assists with nutrition, care and transit needs. [place] Support Programs: @10RELATIVEDAYS @ > Cancer Support Group  2nd Tuesday of the month 1pm-2pm, Journey Room  > Creative Journey  3rd Tuesday of the month 1130am-1pm, Journey Room  > Look Good Feel Better  1st Wednesday of the month 10am-12 noon, Journey Room (Call [place] to register [phone])

## 2015-12-07 NOTE — Progress Notes (Signed)
[name] Nakayama, MD [address] [place] [address]   DIAGNOSIS: Pancreatic cancer metastasized to liver   Staging form: Pancreas, AJCC 7th Edition     Clinical: Stage IV (T3, N1, M1) - Signed by Baird Cancer, PA-C on 03/16/2014     Pathologic: No stage assigned - Unsigned  CONTRAST MEDIA ALLERGY  SUMMARY OF ONCOLOGIC HISTORY:   Pancreatic cancer metastasized to liver ([place])   03/10/2014 Initial Diagnosis    Pancreatic cancer metastasized to liver     03/22/2014 -  Chemotherapy    Abraxane/Gemzar days 1, 8, every 28 days.  Day 15 was cancelled due to leukopenia and thrombocytopenia on day 15 cycle 1.     05/24/2014 Treatment Plan Change    Day 8 of cycle 3 is held with ANC of 1.1     06/05/2014 Imaging    CT C/A/P . Interval decrease in size of the pancreatic tail mass.Improved hepatic metastatic disease. No new lesions. No CT findings for metastatic disease involving the chest.     08/09/2014 Tumor Marker    CA 19-9= 33 (WNL)     10/26/2014 Imaging    MRI- Continued interval decrease in size of the hepatic metastatic lesions and no new lesions are identified. Continued decrease in size of the pancreatic tail lesion.     01/03/2015 Tumor Marker    Results for ALSACE, DOWD (MRN [id number]) as of 01/18/2015 12:52  01/03/2015 10:00 CA 19-9: 14      01/17/2015 Imaging    MRI- Response to therapy of hepatic metastasis.  Similar size of a pancreatic tail lesion.     03/20/2015 Imaging    MRI-L spine- Severe disc space narrowing at L2-L3, with endplate reactive changes. Large disc extrusion into the ventral epidural space,central to the RIGHT with a cephalad migrated free fragment. Additional Large disc extrusion into the retroperitone...     03/22/2015 Imaging    CT pelvis- No evidence for metastatic disease within the pelvis.     07/24/2015 Imaging    MRI abd- Continued response to therapy, with no residual detectable liver metastases. No new sites of  metastatic disease in the abdomen.      08/15/2015 Code Status     She confirms desire for DNR status.      CURRENT THERAPY: Gemzar/Abraxane  INTERVAL HISTORY: [name] 82 y.o. female returns for follow-up of her pancreatic cancer. She is doing fairly well. She notes that she attends church weekly. She does have fatigue.   [name] is accompanied by female friend. She is here for Cycle #23 Abraxane / Gemcitabine.  She complains of knee pain which has improved with follow up in [place].  She reports diarrhea which gets bad with chemotherapy. She typically is constipated about two days immediately after chemotherapy then it turns into diarrhea. This diarrhea will sometimes stop a day then return. She eats prunes and takes Pepto Bismol to regulate her bowels. States the diarrhea is pretty bad when she gets it and she will go one right after the other until she takes [place].  She wears panty liners all the time.   Reports her appetite is not that good. She believes this may be affecting her bowels, as well.   Her friend reports [name]' vision is poor at seeing little things. She last saw her eye doctor about 4 months ago. She was told there was nothing they could do as her eyes may change.  The  patient received Ensure from our dietician, Ovid Curd. She did not like Ensure. She has been drinking milk regularly, instead. Since December weight is down approximately 10 pounds. She denies any pain except for her knee.     MEDICAL HISTORY: Past Medical History:  Diagnosis Date  . Anemia due to antineoplastic chemotherapy 09/12/2015   Started Aranesp 500 mcg on 09/12/2015  . Anxiety   . Chronic diarrhea   . Depression   . DNR (do not resuscitate) 08/16/2015  . Erosive esophagitis   . GERD (gastroesophageal reflux disease)   . Hx of adenomatous colonic polyps    tubular adenomas, last found in 2008  . Hyperplastic colon polyp 03/19/10   tcs by Dr. Gala Romney  . Hypertension 20 years    . Kidney stone    hx/ crushed   . Opioid contract exists 04/18/2015   With Dr. Moshe Cipro  . Pancreatic cancer ([place]) 02/2014  . Pancreatic cancer metastasized to liver ([place]) 03/10/2014  . Schatzki's ring 08/26/10   Last dilated on EGD by Dr. Trevor I[facility], linear gastric erosions, BI hemigastrectomy    has Essential hypertension; GERD (gastroesophageal reflux disease); Anxiety; Primary generalized (osteo)arthritis; Back pain with radiation; IGT (impaired glucose tolerance); Anemia, iron deficiency; Elevated LFTs; Abnormal liver ultrasound; Pancreatic cancer metastasized to liver [facility]); Allergic cough; Allergic rhinitis; Heme positive stool; Knee pain, right; Opioid contract exists; Hx of adenomatous colonic polyps; Chronic low back pain with sciatica; DNR (do not resuscitate); Anemia due to antineoplastic chemotherapy; Medicare annual wellness visit, subsequent; and At high risk for falls on her problem list.     is allergic to iohexol; aciphex [rabeprazole sodium]; amlodipine besylate-valsartan; esomeprazole magnesium; omeprazole; penicillins; and ciprofloxacin.  [name] does not currently have medications on file.  SURGICAL HISTORY: Past Surgical History:  Procedure Laterality Date  . BREAST LUMPECTOMY Left   . bunion removal     from both feet   . CHOLECYSTECTOMY  1965   . COLONOSCOPY  03/19/2010   DR Gala Romney,, normal TI, pancolonic diverticula, random colon bx neg., hyperplastic polyps removed  . ESOPHAGOGASTRODUODENOSCOPY  11/22/2003   DR Gala Romney, erosive RE, Billroth I  . ESOPHAGOGASTRODUODENOSCOPY  08/26/10   Dr. Gala Romney- moderate severe ERE, Scahtzki ring s/p dilation, Billroth I, linear gastric erosions, bx-gastric xanthelasma  . LEFT SHOULDER SURGERY  2009   DR HARRISON  . ORIF ANKLE FRACTURE Right 05/22/2013   Procedure: OPEN REDUCTION INTERNAL FIXATION (ORIF) RIGHT ANKLE FRACTURE;  Surgeon: Sanjuana Kava, MD;  Location: [facility];  Service: Orthopedics;  Laterality: Right;  .  stomach ulcer  50 years ago    had some of her stomach removed     SOCIAL HISTORY: Social History   Social History  . Marital status: Widowed    Spouse name: N/A  . Number of children: 2  . Years of education: N/A   Occupational History  . retired from Teacher, adult education Retired   Social History Main Topics  . Smoking status: Former Smoker    Types: Cigarettes    Quit date: 11/09/2011  . Smokeless tobacco: Never Used     Comment: quit a few weeks ago   . Alcohol use No  . Drug use: No  . Sexual activity: No   Other Topics Concern  . Not on file   Social History Narrative  . No narrative on file    FAMILY HISTORY: Family History  Problem Relation Age of Onset  . Hypertension Mother   . Kidney failure Brother     X1  ON DIALYSIS  . Diabetes Sister   . Cancer Sister     X2  . Hypertension Father   . Liver disease Neg Hx   . Colon cancer Neg Hx     Review of Systems  Constitutional: Negative for malaise/fatigue. HENT: Negative. Eyes: Positive for dry eyes. Negative for vision change, double vision, photophobia, pain, discharge and redness. Dry eyes managed with eye drops.  Respiratory: Negative.   Cardiovascular: Negative.   Gastrointestinal: Positive for constipation and diarrhea. Irregular bowels  Genitourinary: Negative.  Musculoskeletal: Positive for joint pain and back pain.  Knee pain. Skin: Negative.   Neurological: Negative for weakness, dizziness, tingling, tremors, sensory change, speech change, focal weakness, seizures and loss of consciousness. Endo/Heme/Allergies: Negative.   Psychiatric/Behavioral: Negative.   14 point review of systems was performed and is negative except as detailed under history of present illness and above   PHYSICAL EXAMINATION  ECOG PERFORMANCE STATUS: 1 - Symptomatic but completely ambulatory  Vitals with BMI 12/07/2015  Height   Weight 139 lbs 6 oz  BMI   Systolic 034  Diastolic 69  Pulse 77  Respirations 20   Physical  Exam  Constitutional: She is oriented to person, place, and time and well-developed, well-nourished, and in no distress.  Well groomed. HENT: Negative Head: Normocephalic and atraumatic.  Nose: Nose normal.  Mouth/Throat: Oropharynx is clear and moist. No oropharyngeal exudate.  Eyes: Conjunctivae and EOM are normal. Pupils are equal, round, and reactive to light. Right eye exhibits no discharge. Left eye exhibits no discharge. No scleral icterus.  Neck: Normal range of motion. Neck supple. No tracheal deviation present. No thyromegaly present.  Cardiovascular: Normal rate, regular rhythm and normal heart sounds.  Exam reveals no gallop and no friction rub.   No murmur heard. Pulmonary/Chest: Effort normal and breath sounds normal. She has no wheezes. She has no rales.  Abdominal: Soft. Bowel sounds are normal. She exhibits no distension and no mass. There is no tenderness. There is no rebound and no guarding.  Musculoskeletal: Normal range of motion. She exhibits no edema.  Lymphadenopathy:    She has no cervical adenopathy.  Neurological: She is alert and oriented to person, place, and time. She has normal reflexes. No cranial nerve deficit. Gait normal. Coordination normal. Gait slow Skin: Skin is warm and dry. No rash noted.  Psychiatric: Mood, memory, affect and judgment normal.  Nursing note and vitals reviewed.   LABORATORY DATA: I have reviewed the data as listed.  Results for [name], [name] (MRN [id number])   Ref. Range 12/07/2015 09:46  Sodium Latest Ref Range: 135 - 145 mmol/L 139  Potassium Latest Ref Range: 3.5 - 5.1 mmol/L 3.3 (L)  Chloride Latest Ref Range: 101 - 111 mmol/L 105  CO2 Latest Ref Range: 22 - 32 mmol/L 27  BUN Latest Ref Range: 6 - 20 mg/dL 15  Creatinine Latest Ref Range: 0.44 - 1.00 mg/dL 0.90  Calcium Latest Ref Range: 8.9 - 10.3 mg/dL 8.8 (L)  EGFR (Non-African Amer.) Latest Ref Range: >60 mL/min 57 (L)  EGFR (African American) Latest Ref Range: >60  mL/min >60  Glucose Latest Ref Range: 65 - 99 mg/dL 98  Anion gap Latest Ref Range: 5 - 15  7  Alkaline Phosphatase Latest Ref Range: 38 - 126 U/L 78  Albumin Latest Ref Range: 3.5 - 5.0 g/dL 4.1  AST Latest Ref Range: 15 - 41 U/L 16  ALT Latest Ref Range: 14 - 54 U/L 12 (L)  Total Protein  Latest Ref Range: 6.5 - 8.1 g/dL 6.9  Total Bilirubin Latest Ref Range: 0.3 - 1.2 mg/dL 0.7  WBC Latest Ref Range: 4.0 - 10.5 K/uL 6.0  RBC Latest Ref Range: 3.87 - 5.11 MIL/uL 3.84 (L)  Hemoglobin Latest Ref Range: 12.0 - 15.0 g/dL 10.9 (L)  HCT Latest Ref Range: 36.0 - 46.0 % 33.2 (L)  MCV Latest Ref Range: 78.0 - 100.0 fL 86.5  MCH Latest Ref Range: 26.0 - 34.0 pg 28.4  MCHC Latest Ref Range: 30.0 - 36.0 g/dL 32.8  RDW Latest Ref Range: 11.5 - 15.5 % 17.2 (H)  Platelets Latest Ref Range: 150 - 400 K/uL 209  Neutrophils Latest Units: % 65  Lymphocytes Latest Units: % 23  Monocytes Relative Latest Units: % 11  Eosinophil Latest Units: % 1  Basophil Latest Units: % 0  NEUT# Latest Ref Range: 1.7 - 7.7 K/uL 3.9  Lymphocyte # Latest Ref Range: 0.7 - 4.0 K/uL 1.4  Monocyte # Latest Ref Range: 0.1 - 1.0 K/uL 0.6  Eosinophils Absolute Latest Ref Range: 0.0 - 0.7 K/uL 0.1  Basophils Absolute Latest Ref Range: 0.0 - 0.1 K/uL 0.0   RADIOLOGY: I have personally reviewed the radiological images as listed and agreed with the findings in the report.  Study Result   CLINICAL DATA:  Screening.  EXAM: 2D DIGITAL SCREENING BILATERAL MAMMOGRAM WITH CAD AND ADJUNCT TOMO  COMPARISON:  Previous exam(s).  ACR Breast Density Category b: There are scattered areas of fibroglandular density.  FINDINGS: There are no findings suspicious for malignancy. Images were processed with CAD.  IMPRESSION: No mammographic evidence of malignancy. A result letter of this screening mammogram will be mailed directly to the patient.  RECOMMENDATION: Screening mammogram in one year. (Code:SM-B-01Y)  BI-RADS  CATEGORY  1: Negative.   Electronically Signed   By: [name] M.D.   On: 08/08/2015 12:23     ASSESSMENT and THERAPY PLAN:   STAGE IV Pancreatic Cancer Ongoing response to therapy Mild chemotherapy induced neuropathy Anemia Hemoccult positive stool Thrombocytopenia Weight loss Diarrhea/constipation  82 year old female on Gemzar/Abraxane for stage IV pancreatic cancer. She has done remarkably well.  She would like to continue with therapy. Her performance status overall remains a 1. We have discussed taking a break from treatment if she becomes too fatigued but she remains fairly active and feels her QOL is quite good.   DNR status and living will have been addressed by the patient and her son.  The patient is here for Cycle #23 Abraxane / Gemcitabine. I am concerned about her slow/steady weight loss and bowel habits.   [name] continues to experience constipation/diarrhea. She will collect a stool sample and return it to the clinic. We will check for WBC and cultures. She may just need an established bowel regimen. She is difficult to get a good history from.   I have sent a Megace prescription to her pharmacy to stimulate appetite. Although disease looks good in the abdomen, given her weight loss, I am recommending imaging her chest. This has not been done in some time. I have ordered a Chest CT.  Orders Placed This Encounter  Procedures  . Stool Cells, WBC & RBC  . Stool culture  . Stool culture    Standing Status:   Future    Standing Expiration Date:   12/06/2016  . Stool Cells, WBC & RBC    Standing Status:   Future    Standing Expiration Date:   12/06/2016  .  STOOL CULTURE REFLEX - RSASHR  . STOOL CULTURE Reflex - CMPCXR  . MR Abdomen W Wo Contrast    Standing Status:   Future    Number of Occurrences:   1    Standing Expiration Date:   12/06/2016    Order Specific Question:   If indicated for the ordered procedure, I authorize the administration of  contrast media per Radiology protocol    Answer:   Yes    Order Specific Question:   Reason for Exam (SYMPTOM  OR DIAGNOSIS REQUIRED)    Answer:   restaging pancreatic cancer    Order Specific Question:   Preferred imaging location?    Answer:   [facility] (table limit-350lbs)    Order Specific Question:   What is the patient's sedation requirement?    Answer:   No Sedation    Order Specific Question:   Does the patient have a pacemaker or implanted devices?    Answer:   No  . CT Chest Wo Contrast    Standing Status:   Future    Standing Expiration Date:   12/06/2016    Order Specific Question:   Reason for Exam (SYMPTOM  OR DIAGNOSIS REQUIRED)    Answer:   restaging stage IV pancreatic carcinoma, weight loss    Order Specific Question:   Preferred imaging location?    Answer:   [facility]     She will return for follow up post-scans. Her next treatment cycle is scheduled for 12/14/15.  All questions were answered. The patient knows to call the clinic with any problems, questions or concerns. We can certainly see the patient much sooner if necessary.  This document serves as a record of services personally performed by Ancil Linsey, MD. It was created on her behalf by Arlyce Harman, a trained medical scribe. The creation of this record is based on the scribe's personal observations and the provider's statements to them. This document has been checked and approved by the attending provider.  I have reviewed the above documentation for accuracy and completeness, and I agree with the above.  This note was signed electronically. Molli Hazard, MD  12/07/2015

## 2015-12-07 NOTE — Patient Instructions (Signed)
[place] at [facility] Discharge Instructions  RECOMMENDATIONS MADE BY THE CONSULTANT AND ANY TEST RESULTS WILL BE SENT TO YOUR REFERRING PHYSICIAN.  Stool sample for White Blood Cells/culture --needs to be collected  Megace prescription sent to pharmacy - this will stimulate your appetite -- you must take everyday even if you start eating better!  Ct chest scheduled for 12/24/15  MRI abdomen scheduled for 12/21/15  Follow up with Tom after your scans  Thank you for choosing [place] at [facility] to provide your oncology and hematology care.  To afford each patient quality time with our provider, please arrive at least 15 minutes before your scheduled appointment time.   Beginning January 23rd 2017 lab work for the [name] will be done in the  Main lab at [facility] on 1st floor. If you have a lab appointment with the [facility] please come in thru the  Main Entrance and check in at the main information desk  You need to re-schedule your appointment should you arrive 10 or more minutes late.  We strive to give you quality time with our providers, and arriving late affects you and other patients whose appointments are after yours.  Also, if you no show three or more times for appointments you may be dismissed from the clinic at the providers discretion.     Again, thank you for choosing [facility].  Our hope is that these requests will decrease the amount of time that you wait before being seen by our physicians.       _____________________________________________________________  Should you have questions after your visit to [facility], please contact our office at [phone] between the hours of 8:30 a.m. and 4:30 p.m.  Voicemails left after 4:30 p.m. will not be returned until the following business day.  For prescription refill requests, have your pharmacy contact our office.          Resources For Cancer Patients and their Caregivers ? American Cancer Society: Can assist with transportation, wigs, general needs, runs Look Good Feel Better.        934-779-0732 ? Cancer Care: Provides financial assistance, online support groups, medication/co-pay assistance.  1-800-813-HOPE 514-516-1831) ? [place] Assists [place] cancer patients and their families through emotional , educational and financial support.  979-608-9913 ? [place] DSS Where to apply for food stamps, Medicaid and utility assistance. 2725508798 ? RCATS: Transportation to medical appointments. (813) 137-0119 ? Social Security Administration: May apply for disability if have a Stage IV cancer. (606) 470-6827 971-481-8447 ? [employer], Disability and Transit Services: Assists with nutrition, care and transit needs. [place] Support Programs: @10RELATIVEDAYS @ > Cancer Support Group  2nd Tuesday of the month 1pm-2pm, Journey Room  > Creative Journey  3rd Tuesday of the month 1130am-1pm, Journey Room  > Look Good Feel Better  1st Wednesday of the month 10am-12 noon, Journey Room (Call [place] to register [phone])

## 2015-12-07 NOTE — Progress Notes (Signed)
Patient tolerated infusion well.  VSS.   

## 2015-12-10 ENCOUNTER — Other Ambulatory Visit: Payer: Self-pay

## 2015-12-10 ENCOUNTER — Telehealth: Payer: Self-pay | Admitting: Family Medicine

## 2015-12-10 DIAGNOSIS — C787 Secondary malignant neoplasm of liver and intrahepatic bile duct: Secondary | ICD-10-CM | POA: Diagnosis not present

## 2015-12-10 DIAGNOSIS — C801 Malignant (primary) neoplasm, unspecified: Secondary | ICD-10-CM | POA: Diagnosis not present

## 2015-12-10 DIAGNOSIS — C259 Malignant neoplasm of pancreas, unspecified: Secondary | ICD-10-CM | POA: Diagnosis not present

## 2015-12-10 DIAGNOSIS — C252 Malignant neoplasm of tail of pancreas: Secondary | ICD-10-CM | POA: Diagnosis not present

## 2015-12-10 DIAGNOSIS — M1711 Unilateral primary osteoarthritis, right knee: Secondary | ICD-10-CM | POA: Diagnosis not present

## 2015-12-10 MED ORDER — POTASSIUM CHLORIDE ER 10 MEQ PO CPCR: 10.0000 meq | ORAL_CAPSULE | Freq: Two times a day (BID) | ORAL | 1 refills | Status: DC

## 2015-12-10 NOTE — Telephone Encounter (Signed)
[name] is asking for med refill on potassium chloride (MICRO-K) 10 MEQ CR capsule

## 2015-12-10 NOTE — Telephone Encounter (Signed)
Medication refilled

## 2015-12-12 ENCOUNTER — Encounter (HOSPITAL_COMMUNITY): Payer: Medicare Other | Attending: Hematology & Oncology

## 2015-12-12 ENCOUNTER — Encounter (HOSPITAL_COMMUNITY): Payer: Medicare Other

## 2015-12-12 DIAGNOSIS — C787 Secondary malignant neoplasm of liver and intrahepatic bile duct: Principal | ICD-10-CM

## 2015-12-12 DIAGNOSIS — C259 Malignant neoplasm of pancreas, unspecified: Secondary | ICD-10-CM | POA: Insufficient documentation

## 2015-12-12 LAB — COMPREHENSIVE METABOLIC PANEL
ALBUMIN: 3.7 g/dL (ref 3.5–5.0)
ALT: 13 U/L — ABNORMAL LOW (ref 14–54)
ANION GAP: 5 (ref 5–15)
AST: 17 U/L (ref 15–41)
Alkaline Phosphatase: 66 U/L (ref 38–126)
BUN: 23 mg/dL — ABNORMAL HIGH (ref 6–20)
CO2: 24 mmol/L (ref 22–32)
Calcium: 8.4 mg/dL — ABNORMAL LOW (ref 8.9–10.3)
Chloride: 108 mmol/L (ref 101–111)
Creatinine, Ser: 0.97 mg/dL (ref 0.44–1.00)
GFR calc Af Amer: >60 mL/min (ref >60)
GFR calc non Af Amer: 52 mL/min — ABNORMAL LOW (ref >60)
GLUCOSE: 125 mg/dL — AB (ref 65–99)
POTASSIUM: 4.2 mmol/L (ref 3.5–5.1)
SODIUM: 137 mmol/L (ref 135–145)
TOTAL PROTEIN: 6.7 g/dL (ref 6.5–8.1)
Total Bilirubin: 0.8 mg/dL (ref 0.3–1.2)

## 2015-12-12 LAB — CBC
HCT: 31.4 % — ABNORMAL LOW (ref 36.0–46.0)
Hemoglobin: 10.3 g/dL — ABNORMAL LOW (ref 12.0–15.0)
MCH: 28.2 pg (ref 26.0–34.0)
MCHC: 32.8 g/dL (ref 30.0–36.0)
MCV: 86 fL (ref 78.0–100.0)
PLATELETS: 121 10*3/uL — AB (ref 150–400)
RBC: 3.65 MIL/uL — ABNORMAL LOW (ref 3.87–5.11)
RDW: 16.6 % — AB (ref 11.5–15.5)
WBC: 5.2 10*3/uL (ref 4.0–10.5)

## 2015-12-12 NOTE — Progress Notes (Signed)
Hemoglobin 10.3. Aranesp not given, treatment parameters not met. 

## 2015-12-14 ENCOUNTER — Encounter (HOSPITAL_BASED_OUTPATIENT_CLINIC_OR_DEPARTMENT_OTHER): Payer: Medicare Other

## 2015-12-14 ENCOUNTER — Encounter (HOSPITAL_COMMUNITY): Payer: Self-pay | Admitting: Oncology

## 2015-12-14 VITALS — BP 154/69 | HR 76 | Temp 97.8°F | Resp 18 | Wt 137.0 lb

## 2015-12-14 DIAGNOSIS — C259 Malignant neoplasm of pancreas, unspecified: Secondary | ICD-10-CM | POA: Diagnosis not present

## 2015-12-14 DIAGNOSIS — Z5111 Encounter for antineoplastic chemotherapy: Secondary | ICD-10-CM

## 2015-12-14 DIAGNOSIS — C787 Secondary malignant neoplasm of liver and intrahepatic bile duct: Secondary | ICD-10-CM | POA: Diagnosis not present

## 2015-12-14 MED ORDER — SODIUM CHLORIDE 0.9 % IV SOLN
Freq: Once | INTRAVENOUS | Status: AC
  Administered 2015-12-14: 10:00:00 via INTRAVENOUS

## 2015-12-14 MED ORDER — SODIUM CHLORIDE 0.9 % IV SOLN
Freq: Once | INTRAVENOUS | Status: AC
  Administered 2015-12-14: 8 mg via INTRAVENOUS
  Filled 2015-12-14: qty 4

## 2015-12-14 MED ORDER — GEMCITABINE HCL CHEMO INJECTION 1 GM/26.3ML
1000.0000 mg/m2 | Freq: Once | INTRAVENOUS | Status: AC
  Administered 2015-12-14: 1786 mg via INTRAVENOUS
  Filled 2015-12-14: qty 46.97

## 2015-12-14 MED ORDER — PACLITAXEL PROTEIN-BOUND CHEMO INJECTION 100 MG
125.0000 mg/m2 | Freq: Once | INTRAVENOUS | Status: AC
  Administered 2015-12-14: 225 mg via INTRAVENOUS
  Filled 2015-12-14: qty 45

## 2015-12-14 MED ORDER — HEPARIN SOD (PORK) LOCK FLUSH 100 UNIT/ML IV SOLN
500.0000 [IU] | Freq: Once | INTRAVENOUS | Status: AC | PRN
  Administered 2015-12-14: 500 [IU]
  Filled 2015-12-14: qty 5

## 2015-12-14 MED ORDER — SODIUM CHLORIDE 0.9 % IJ SOLN
10.0000 mL | INTRAMUSCULAR | Status: DC | PRN
  Administered 2015-12-14 (×2): 10 mL
  Filled 2015-12-14 (×2): qty 10

## 2015-12-14 NOTE — Progress Notes (Signed)
[name] tolerated chemo tx well without complaints. Pt discharged self ambulatory in satisfactory condition with family

## 2015-12-14 NOTE — Patient Instructions (Signed)
[facility] Discharge Instructions for Patients Receiving Chemotherapy   Beginning January 23rd 2017 lab work for the Cancer Center will be done in the  Main lab at [facility] on 1st floor. If you have a lab appointment with the Cancer Center please come in thru the  Main Entrance and check in at the main information desk   Today you received the following chemotherapy agents Abraxane and Gemzar. Follow-up as scheduled. Call clinic for any questions or concerns  To help prevent nausea and vomiting after your treatment, we encourage you to take your nausea medication   If you develop nausea and vomiting, or diarrhea that is not controlled by your medication, call the clinic.  The clinic phone number is [phone]. Office hours are Monday-Friday 8:30am-5:00pm.  BELOW ARE SYMPTOMS THAT SHOULD BE REPORTED IMMEDIATELY:  *FEVER GREATER THAN 101.0 F  *CHILLS WITH OR WITHOUT FEVER  NAUSEA AND VOMITING THAT IS NOT CONTROLLED WITH YOUR NAUSEA MEDICATION  *UNUSUAL SHORTNESS OF BREATH  *UNUSUAL BRUISING OR BLEEDING  TENDERNESS IN MOUTH AND THROAT WITH OR WITHOUT PRESENCE OF ULCERS  *URINARY PROBLEMS  *BOWEL PROBLEMS  UNUSUAL RASH Items with * indicate a potential emergency and should be followed up as soon as possible. If you have an emergency after office hours please contact your primary care physician or go to the nearest emergency department.  Please call the clinic during office hours if you have any questions or concerns.   You may also contact the Patient Navigator at [phone] should you have any questions or need assistance in obtaining follow up care.      Resources For Cancer Patients and their Caregivers ? American Cancer Society: Can assist with transportation, wigs, general needs, runs Look Good Feel Better.        1-888-227-6333 ? Cancer Care: Provides financial assistance, online support groups, medication/co-pay assistance.   1-800-813-HOPE (4673) ? [facility] Assists [place] cancer patients and their families through emotional , educational and financial support.  336-427-4357 ? [place] DSS Where to apply for food stamps, Medicaid and utility assistance. 336-342-1394 ? RCATS: Transportation to medical appointments. 336-347-2287 ? Social Security Administration: May apply for disability if have a Stage IV cancer. 336-342-7796 1-800-772-1213 ? [place] Aging, Disability and Transit Services: Assists with nutrition, care and transit needs. 336-349-2343         

## 2015-12-15 LAB — STOOL CULTURE: E COLI SHIGA TOXIN ASSAY: NEGATIVE

## 2015-12-15 LAB — STOOL CULTURE REFLEX - CMPCXR

## 2015-12-15 LAB — STOOL CULTURE REFLEX - RSASHR

## 2015-12-18 ENCOUNTER — Ambulatory Visit (HOSPITAL_COMMUNITY): Payer: Medicare Other

## 2015-12-20 ENCOUNTER — Other Ambulatory Visit: Payer: Self-pay | Admitting: Family Medicine

## 2015-12-20 ENCOUNTER — Other Ambulatory Visit (HOSPITAL_COMMUNITY): Payer: Self-pay | Admitting: Hematology & Oncology

## 2015-12-20 ENCOUNTER — Telehealth (HOSPITAL_COMMUNITY): Payer: Self-pay | Admitting: Oncology

## 2015-12-20 ENCOUNTER — Telehealth: Payer: Self-pay | Admitting: Family Medicine

## 2015-12-20 DIAGNOSIS — R530 Neoplastic (malignant) related fatigue: Secondary | ICD-10-CM

## 2015-12-20 NOTE — Telephone Encounter (Signed)
[name] is stating that she is having problems getting her medications/ call [phone] case # [id number]

## 2015-12-20 NOTE — Telephone Encounter (Signed)
Patient has been waiting on an rx from orthopedics. Called their office for an update. The med needed a PA and and they are waiting on that. I called pt back and explained that to her and told her to call me back if she hadn't received a response by tomorrow pm

## 2015-12-20 NOTE — Telephone Encounter (Signed)
Megestrol acetate approved 05/13/15-12/19/16 [facility]

## 2015-12-21 ENCOUNTER — Other Ambulatory Visit (HOSPITAL_COMMUNITY): Payer: Self-pay | Admitting: Hematology & Oncology

## 2015-12-21 ENCOUNTER — Ambulatory Visit (HOSPITAL_COMMUNITY)
Admission: RE | Admit: 2015-12-21 | Discharge: 2015-12-21 | Disposition: A | Discharge status: Home / Self Care | Payer: Medicare Other | Source: Ambulatory Visit | Attending: Hematology & Oncology | Admitting: Hematology & Oncology

## 2015-12-21 ENCOUNTER — Other Ambulatory Visit: Payer: Self-pay

## 2015-12-21 DIAGNOSIS — R634 Abnormal weight loss: Secondary | ICD-10-CM

## 2015-12-21 DIAGNOSIS — C259 Malignant neoplasm of pancreas, unspecified: Secondary | ICD-10-CM

## 2015-12-21 DIAGNOSIS — M5136 Other intervertebral disc degeneration, lumbar region: Secondary | ICD-10-CM | POA: Diagnosis not present

## 2015-12-21 DIAGNOSIS — C787 Secondary malignant neoplasm of liver and intrahepatic bile duct: Secondary | ICD-10-CM | POA: Diagnosis not present

## 2015-12-21 DIAGNOSIS — I708 Atherosclerosis of other arteries: Secondary | ICD-10-CM | POA: Insufficient documentation

## 2015-12-21 DIAGNOSIS — R197 Diarrhea, unspecified: Secondary | ICD-10-CM | POA: Diagnosis not present

## 2015-12-21 DIAGNOSIS — M47896 Other spondylosis, lumbar region: Secondary | ICD-10-CM | POA: Insufficient documentation

## 2015-12-21 DIAGNOSIS — Q6102 Congenital multiple renal cysts: Secondary | ICD-10-CM | POA: Insufficient documentation

## 2015-12-21 DIAGNOSIS — R935 Abnormal findings on diagnostic imaging of other abdominal regions, including retroperitoneum: Secondary | ICD-10-CM | POA: Diagnosis not present

## 2015-12-21 DIAGNOSIS — I517 Cardiomegaly: Secondary | ICD-10-CM | POA: Diagnosis not present

## 2015-12-21 MED ORDER — GADOBENATE DIMEGLUMINE 529 MG/ML IV SOLN
15.0000 mL | Freq: Once | INTRAVENOUS | Status: AC | PRN
  Administered 2015-12-21: 12 mL via INTRAVENOUS

## 2015-12-21 MED ORDER — CELECOXIB 200 MG PO CAPS: 200.0000 mg | ORAL_CAPSULE | Freq: Every day | ORAL | 3 refills | Status: DC

## 2015-12-24 ENCOUNTER — Encounter (HOSPITAL_BASED_OUTPATIENT_CLINIC_OR_DEPARTMENT_OTHER): Payer: Medicare Other | Admitting: Oncology

## 2015-12-24 ENCOUNTER — Other Ambulatory Visit: Payer: Self-pay

## 2015-12-24 ENCOUNTER — Encounter (HOSPITAL_COMMUNITY): Payer: Self-pay | Admitting: Oncology

## 2015-12-24 VITALS — BP 149/82 | HR 79 | Temp 98.2°F | Resp 16 | Wt 137.0 lb

## 2015-12-24 DIAGNOSIS — C259 Malignant neoplasm of pancreas, unspecified: Secondary | ICD-10-CM | POA: Diagnosis not present

## 2015-12-24 DIAGNOSIS — C787 Secondary malignant neoplasm of liver and intrahepatic bile duct: Secondary | ICD-10-CM

## 2015-12-24 MED ORDER — HYDROCODONE-ACETAMINOPHEN 5-325 MG PO TABS: ORAL_TABLET | ORAL | 0 refills | Status: DC

## 2015-12-24 NOTE — Patient Instructions (Addendum)
[name] at [facility] Discharge Instructions  RECOMMENDATIONS MADE BY THE CONSULTANT AND ANY TEST RESULTS WILL BE SENT TO YOUR REFERRING PHYSICIAN.  You saw [name] Crigler, PA-C, today. You will have a Chest CT in 1-4 weeks. Labs and follow up as scheduled.  Thank you for choosing [place] at [facility] to provide your oncology and hematology care.  To afford each patient quality time with our provider, please arrive at least 15 minutes before your scheduled appointment time.   Beginning January 23rd 2017 lab work for the [name] will be done in the  Main lab at [facility] on 1st floor. If you have a lab appointment with the [facility] please come in thru the  Main Entrance and check in at the main information desk  You need to re-schedule your appointment should you arrive 10 or more minutes late.  We strive to give you quality time with our providers, and arriving late affects you and other patients whose appointments are after yours.  Also, if you no show three or more times for appointments you may be dismissed from the clinic at the providers discretion.     Again, thank you for choosing [facility].  Our hope is that these requests will decrease the amount of time that you wait before being seen by our physicians.       _____________________________________________________________  Should you have questions after your visit to [name], please contact our office at [phone] between the hours of 8:30 a.m. and 4:30 p.m.  Voicemails left after 4:30 p.m. will not be returned until the following business day.  For prescription refill requests, have your pharmacy contact our office.         Resources For Cancer Patients and their Caregivers ? American Cancer Society: Can assist with transportation, wigs, general needs, runs Look Good Feel Better.        819-446-2833 ? Cancer Care: Provides  financial assistance, online support groups, medication/co-pay assistance.  1-800-813-HOPE 930-687-8675) ? [place] Assists [name] cancer patients and their families through emotional , educational and financial support.  9348025197 ? [place] DSS Where to apply for food stamps, Medicaid and utility assistance. [phone] ? RCATS: Transportation to medical appointments. 812-521-2740 ? Social Security Administration: May apply for disability if have a Stage IV cancer. [phone] 608-394-4254 ? [employer], Disability and Transit Services: Assists with nutrition, care and transit needs. [place] Support Programs: @10RELATIVEDAYS @ > Cancer Support Group  2nd Tuesday of the month 1pm-2pm, Journey Room  > Creative Journey  3rd Tuesday of the month 1130am-1pm, Journey Room  > Look Good Feel Better  1st Wednesday of the month 10am-12 noon, Journey Room (Call [place] to register [phone])

## 2015-12-24 NOTE — Progress Notes (Signed)
[name], MD [address] [place] [address]  Pancreatic cancer metastasized to liver [facility]) - Plan: Comprehensive metabolic panel, Cancer antigen 19-9, Comprehensive metabolic panel, Cancer antigen 19-9  CURRENT THERAPY: Palliative Gemcitabine/Abraxane  INTERVAL HISTORY: [name] 82 y.o. female returns for followup of Stage IV pancreatic cancer.    Pancreatic cancer metastasized to liver ([place])   03/10/2014 Initial Diagnosis    Pancreatic cancer metastasized to liver     03/22/2014 -  Chemotherapy    Abraxane/Gemzar days 1, 8, every 28 days.  Day 15 was cancelled due to leukopenia and thrombocytopenia on day 15 cycle 1.     05/24/2014 Treatment Plan Change    Day 8 of cycle 3 is held with ANC of 1.1     06/05/2014 Imaging    CT C/A/P . Interval decrease in size of the pancreatic tail mass.Improved hepatic metastatic disease. No new lesions. No CT findings for metastatic disease involving the chest.     08/09/2014 Tumor Marker    CA 19-9= 33 (WNL)     10/26/2014 Imaging    MRI- Continued interval decrease in size of the hepatic metastatic lesions and no new lesions are identified. Continued decrease in size of the pancreatic tail lesion.     01/03/2015 Tumor Marker    Results for TUNISHA, MINICOZZI (MRN [id number]) as of 01/18/2015 12:52  01/03/2015 10:00 CA 19-9: 14      01/17/2015 Imaging    MRI- Response to therapy of hepatic metastasis.  Similar size of a pancreatic tail lesion.     03/20/2015 Imaging    MRI-L spine- Severe disc space narrowing at L2-L3, with endplate reactive changes. Large disc extrusion into the ventral epidural space,central to the RIGHT with a cephalad migrated free fragment. Additional Large disc extrusion into the retroperitone...     03/22/2015 Imaging    CT pelvis- No evidence for metastatic disease within the pelvis.     07/24/2015 Imaging    MRI abd- Continued response to therapy, with no residual detectable  liver metastases. No new sites of metastatic disease in the abdomen.      08/15/2015 Code Status     She confirms desire for DNR status.     12/21/2015 Imaging    MRI liver- Severe image degradation due to motion artifact reducing diagnostic sensitivity and specificity. Reduce conspicuity of the pancreatic tail lesion suggesting further improvement. The original liver lesions have resolved.      She reports that her only complaint is right knee pain.  This is being followed by ortho.  She notes that her bowels are back to normal.  She notes constipation following chemotherapy, followed by diarrhea.  She notes that it is better.  Review of Systems  Constitutional: Negative for chills and fever.  HENT: Negative.  Negative for congestion and sore throat.   Eyes: Negative.  Negative for blurred vision and double vision.  Respiratory: Negative.  Negative for cough, sputum production and wheezing.   Cardiovascular: Negative.  Negative for chest pain.  Gastrointestinal: Positive for constipation (following chemotherapy) and diarrhea (follows constipation after chemotherapy.). Negative for abdominal pain, blood in stool, melena, nausea and vomiting.  Genitourinary: Negative.  Negative for dysuria, frequency and hematuria.  Musculoskeletal: Positive for back pain and joint pain (Knee pain followed by Dr. Luna Glasgow). Negative for falls.  Skin: Negative.   Neurological: Negative.  Negative for headaches.  Endo/Heme/Allergies: Negative.   Psychiatric/Behavioral: Negative.  Past Medical History:  Diagnosis Date  . Anemia due to antineoplastic chemotherapy 09/12/2015   Started Aranesp 500 mcg on 09/12/2015  . Anxiety   . Chronic diarrhea   . Depression   . DNR (do not resuscitate) 08/16/2015  . Erosive esophagitis   . GERD (gastroesophageal reflux disease)   . Hx of adenomatous colonic polyps    tubular adenomas, last found in 2008  . Hyperplastic colon polyp 03/19/10   tcs by Dr. Gala Romney  .  Hypertension 20 years   . Kidney stone    hx/ crushed   . Opioid contract exists 04/18/2015   With Dr. Moshe Cipro  . Pancreatic cancer ([place]) 02/2014  . Pancreatic cancer metastasized to liver ([place]) 03/10/2014  . Schatzki's ring 08/26/10   Last dilated on EGD by Dr. Trevor Iha [facility], linear gastric erosions, BI hemigastrectomy    Past Surgical History:  Procedure Laterality Date  . BREAST LUMPECTOMY Left   . bunion removal     from both feet   . CHOLECYSTECTOMY  1965   . COLONOSCOPY  03/19/2010   DR Gala Romney,, normal TI, pancolonic diverticula, random colon bx neg., hyperplastic polyps removed  . ESOPHAGOGASTRODUODENOSCOPY  11/22/2003   DR Gala Romney, erosive RE, Billroth I  . ESOPHAGOGASTRODUODENOSCOPY  08/26/10   Dr. Gala Romney- moderate severe ERE, Scahtzki ring s/p dilation, Billroth I, linear gastric erosions, bx-gastric xanthelasma  . LEFT SHOULDER SURGERY  2009   DR HARRISON  . ORIF ANKLE FRACTURE Right 05/22/2013   Procedure: OPEN REDUCTION INTERNAL FIXATION (ORIF) RIGHT ANKLE FRACTURE;  Surgeon: Sanjuana Kava, MD;  Location: [facility];  Service: Orthopedics;  Laterality: Right;  . stomach ulcer  50 years ago    had some of her stomach removed     Family History  Problem Relation Age of Onset  . Hypertension Mother   . Kidney failure Brother     X1 ON DIALYSIS  . Diabetes Sister   . Cancer Sister     X2  . Hypertension Father   . Liver disease Neg Hx   . Colon cancer Neg Hx     Social History   Social History  . Marital status: Widowed    Spouse name: N/A  . Number of children: 2  . Years of education: N/A   Occupational History  . retired from Teacher, adult education Retired   Social History Main Topics  . Smoking status: Former Smoker    Types: Cigarettes    Quit date: 11/09/2011  . Smokeless tobacco: Never Used     Comment: quit a few weeks ago   . Alcohol use No  . Drug use: No  . Sexual activity: No   Other Topics Concern  . None   Social History Narrative  . None     PHYSICAL  EXAMINATION  ECOG PERFORMANCE STATUS: 1 - Symptomatic but completely ambulatory  Vitals:   12/24/15 0932  BP: (!) 149/82  Pulse: 79  Resp: 16  Temp: 98.2 F (36.8 C)     GENERAL:alert, no distress, comfortable, cooperative, smiling and accompanied by her son. SKIN: skin color, texture, turgor are normal, no rashes or significant lesions. HEAD: Normocephalic, No masses, lesions, tenderness or abnormalities EYES: normal, EOMI, Conjunctiva are pink and non-injected EARS: External ears normal OROPHARYNX:mucous membranes are moist  NECK: supple, no adenopathy, trachea midline LYMPH:  not examined BREAST:not examined LUNGS: clear to auscultation and percussion without wheezes, rales, or rhonchi.  HEART: regular rate & rhythm, no murmurs, no gallops, S1 normal and S2 normal  ABDOMEN:abdomen soft, non-tender and normal bowel sounds BACK: Back symmetric, no curvature. EXTREMITIES:less then 2 second capillary refill, no joint deformities, effusion, or inflammation, no edema, no skin discoloration, no clubbing, no cyanosis  NEURO: alert & oriented x 3 with fluent speech, no focal motor/sensory deficits, gait normal   LABORATORY DATA: CBC    Component Value Date/Time   WBC 5.2 12/12/2015 1030   RBC 3.65 (L) 12/12/2015 1030   HGB 10.3 (L) 12/12/2015 1030   HCT 31.4 (L) 12/12/2015 1030   PLT 121 (L) 12/12/2015 1030   MCV 86.0 12/12/2015 1030   MCH 28.2 12/12/2015 1030   MCHC 32.8 12/12/2015 1030   RDW 16.6 (H) 12/12/2015 1030   LYMPHSABS 1.4 12/07/2015 0946   MONOABS 0.6 12/07/2015 0946   EOSABS 0.1 12/07/2015 0946   BASOSABS 0.0 12/07/2015 0946      Chemistry      Component Value Date/Time   NA 137 12/12/2015 1030   K 4.2 12/12/2015 1030   CL 108 12/12/2015 1030   CO2 24 12/12/2015 1030   BUN 23 (H) 12/12/2015 1030   CREATININE 0.97 12/12/2015 1030   CREATININE 0.97 02/14/2014 0758      Component Value Date/Time   CALCIUM 8.4 (L) 12/12/2015 1030   ALKPHOS 66 12/12/2015  1030   AST 17 12/12/2015 1030   ALT 13 (L) 12/12/2015 1030   BILITOT 0.8 12/12/2015 1030     Results for ELLYSSA, SHAHAN (MRN [id number]) as of 10/31/2015 07:58  Ref. Range 05/16/2015 10:00 06/13/2015 09:29 07/11/2015 09:15 09/05/2015 10:24 10/03/2015 09:24  CA 19-9 Latest Ref Range: 0-35 U/mL 14 17 13 14 23     PENDING LABS:   RADIOGRAPHIC STUDIES:  Mr Abdomen W Wo Contrast  Result Date: 12/21/2015 CLINICAL DATA:  Pancreatic cancer, metastatic disease to the liver. Weight loss and diarrhea for 3 months. EXAM: MRI ABDOMEN WITHOUT AND WITH CONTRAST (INCLUDING MRCP) TECHNIQUE: Multiplanar multisequence MR imaging of the abdomen was performed both before and after the administration of intravenous contrast. Heavily T2-weighted images of the biliary and pancreatic ducts were obtained, and three-dimensional MRCP images were rendered by post processing. CONTRAST:  5mL MULTIHANCE GADOBENATE DIMEGLUMINE 529 MG/ML IV SOLN COMPARISON:  Multiple exams, including 07/24/2015 and 03/02/2014 FINDINGS: Despite efforts by the technologist and patient, motion artifact is present on today's exam and could not be eliminated. This reduces exam sensitivity and specificity. Lower chest:  Mild cardiomegaly. Hepatobiliary: The metastatic lesions shown in the liver on 03/02/2014 are not apparent on today' s exam. This is a stable appearance compared to the more recent comparison of 07/24/2015. There is continued mild intrahepatic and extrahepatic biliary dilatation. Pancreas: Mildly restricted diffusion in the pancreatic tail at the site of the prior malignancy. Some of the heterogeneous enhancement in the vicinity of the known mass is due to splenic vasculature along the splenic hilum, which is difficult to separate from the mass itself. There is reduce conspicuity of the mass compared to the prior exam, although this is partly due to the severity of motion artifact. Overall I believe the appearance is slightly improved. No  pancreatic duct dilatation. Spleen: Unremarkable Adrenals/Urinary Tract: Bilateral small renal cysts are present. This includes a 2.4 by 1.4 cm Bosniak category 2 cyst of the left mid kidney medially, and a 1.2 cm Bosniak category 2 cyst of the left mid kidney laterally. Adrenal glands unremarkable. Stomach/Bowel: Unremarkable Vascular/Lymphatic: Atherosclerosis of the abdominal aorta. Atherosclerotic narrowing of the celiac trunk origin. No pathologic adenopathy identified. Other: No supplemental  non-categorized findings. Musculoskeletal: Lumbar spondylosis and degenerative disc disease. IMPRESSION: 1. Severe image degradation due to motion artifact reducing diagnostic sensitivity and specificity. 2. Reduce conspicuity of the pancreatic tail lesion suggesting further improvement. Aside from some mild restricted diffusion on diffusion-weighted images, the prior lesion is not readily apparent (although some of this may be attributable to the severity of motion artifact). In addition, the original liver lesions have resolved with no current liver lesions visible. 3. Other imaging findings of potential clinical significance: Mild cardiomegaly. Stable biliary dilatation. Innumerable tiny renal cysts with several Bosniak category 2 cysts but no observed plate worrisome renal lesions. Considerable arterial atherosclerosis. Lumbar spondylosis and degenerative disc disease. Electronically Signed   By: Van Clines M.D.   On: 12/21/2015 12:55   Mr 3d Recon At Scanner  Result Date: 12/21/2015 CLINICAL DATA:  Pancreatic cancer, metastatic disease to the liver. Weight loss and diarrhea for 3 months. EXAM: MRI ABDOMEN WITHOUT AND WITH CONTRAST (INCLUDING MRCP) TECHNIQUE: Multiplanar multisequence MR imaging of the abdomen was performed both before and after the administration of intravenous contrast. Heavily T2-weighted images of the biliary and pancreatic ducts were obtained, and three-dimensional MRCP images were  rendered by post processing. CONTRAST:  83mL MULTIHANCE GADOBENATE DIMEGLUMINE 529 MG/ML IV SOLN COMPARISON:  Multiple exams, including 07/24/2015 and 03/02/2014 FINDINGS: Despite efforts by the technologist and patient, motion artifact is present on today's exam and could not be eliminated. This reduces exam sensitivity and specificity. Lower chest:  Mild cardiomegaly. Hepatobiliary: The metastatic lesions shown in the liver on 03/02/2014 are not apparent on today' s exam. This is a stable appearance compared to the more recent comparison of 07/24/2015. There is continued mild intrahepatic and extrahepatic biliary dilatation. Pancreas: Mildly restricted diffusion in the pancreatic tail at the site of the prior malignancy. Some of the heterogeneous enhancement in the vicinity of the known mass is due to splenic vasculature along the splenic hilum, which is difficult to separate from the mass itself. There is reduce conspicuity of the mass compared to the prior exam, although this is partly due to the severity of motion artifact. Overall I believe the appearance is slightly improved. No pancreatic duct dilatation. Spleen: Unremarkable Adrenals/Urinary Tract: Bilateral small renal cysts are present. This includes a 2.4 by 1.4 cm Bosniak category 2 cyst of the left mid kidney medially, and a 1.2 cm Bosniak category 2 cyst of the left mid kidney laterally. Adrenal glands unremarkable. Stomach/Bowel: Unremarkable Vascular/Lymphatic: Atherosclerosis of the abdominal aorta. Atherosclerotic narrowing of the celiac trunk origin. No pathologic adenopathy identified. Other: No supplemental non-categorized findings. Musculoskeletal: Lumbar spondylosis and degenerative disc disease. IMPRESSION: 1. Severe image degradation due to motion artifact reducing diagnostic sensitivity and specificity. 2. Reduce conspicuity of the pancreatic tail lesion suggesting further improvement. Aside from some mild restricted diffusion on  diffusion-weighted images, the prior lesion is not readily apparent (although some of this may be attributable to the severity of motion artifact). In addition, the original liver lesions have resolved with no current liver lesions visible. 3. Other imaging findings of potential clinical significance: Mild cardiomegaly. Stable biliary dilatation. Innumerable tiny renal cysts with several Bosniak category 2 cysts but no observed plate worrisome renal lesions. Considerable arterial atherosclerosis. Lumbar spondylosis and degenerative disc disease. Electronically Signed   By: Van Clines M.D.   On: 12/21/2015 12:55     PATHOLOGY:    ASSESSMENT AND PLAN:  Pancreatic cancer metastasized to liver Stage IV Pancreatic Cancer, on systemic therapy with Abraxane and Gemzar  in a day 1, 8 fashion every 28 days.  Continued response to therapy.  Oncology history is updated.   Patient has entered into a pain contract with her primary care provider for non-oncologic pain.  She has been followed by Dr. Luna Glasgow (Ortho) for her knee and ankle pain.  She is S/P corticosteroid injection by Dr. Luna Glasgow on 09/27/2015.  She continues with knee pain and I'll defer this to orthopedics.  Pre-chemo labs as ordered: CBC diff, CMET, CA 19-9.   I personally reviewed and went over laboratory results with the patient.  The results are noted within this dictation.    DNR  I personally reviewed and went over radiographic studies with the patient.  The results are noted within this dictation.  MRI of abdomen are reviewed.  Excellent response to therapy is noted.  She has not had chest imaging in > 12 months.  Order for CT of chest is ordered.  Return as scheduled for follow-up and next cycle of chemotherapy.   ORDERS PLACED FOR THIS ENCOUNTER: Orders Placed This Encounter  Procedures  . Comprehensive metabolic panel  . Cancer antigen 19-9  . Comprehensive metabolic panel  . Cancer antigen 19-9    MEDICATIONS  PRESCRIBED THIS ENCOUNTER: No orders of the defined types were placed in this encounter.   THERAPY PLAN:  Continue treatment as planned.  All questions were answered. The patient knows to call the clinic with any problems, questions or concerns. We can certainly see the patient much sooner if necessary.  Patient and plan discussed with Dr. Ancil Linsey and she is in agreement with the aforementioned.   This note is electronically signed by: Robynn Pane, PA-C 12/24/2015 10:14 AM

## 2015-12-24 NOTE — Assessment & Plan Note (Addendum)
Stage IV Pancreatic Cancer, on systemic therapy with Abraxane and Gemzar in a day 1, 8 fashion every 28 days.  Continued response to therapy.  Oncology history is updated.   Patient has entered into a pain contract with her primary care provider for non-oncologic pain.  She has been followed by Dr. Luna Glasgow (Ortho) for her knee and ankle pain.  She is S/P corticosteroid injection by Dr. Luna Glasgow on 09/27/2015.  She continues with knee pain and I'll defer this to orthopedics.  Pre-chemo labs as ordered: CBC diff, CMET, CA 19-9.   I personally reviewed and went over laboratory results with the patient.  The results are noted within this dictation.    DNR  I personally reviewed and went over radiographic studies with the patient.  The results are noted within this dictation.  MRI of abdomen are reviewed.  Excellent response to therapy is noted.  She has not had chest imaging in > 12 months.  Order for CT of chest is ordered.  Return as scheduled for follow-up and next cycle of chemotherapy.

## 2015-12-26 ENCOUNTER — Encounter (HOSPITAL_COMMUNITY): Payer: Self-pay | Admitting: *Deleted

## 2015-12-29 ENCOUNTER — Encounter (HOSPITAL_COMMUNITY): Payer: Self-pay | Admitting: Hematology & Oncology

## 2016-01-01 ENCOUNTER — Encounter: Payer: Self-pay | Admitting: Family Medicine

## 2016-01-01 ENCOUNTER — Ambulatory Visit (INDEPENDENT_AMBULATORY_CARE_PROVIDER_SITE_OTHER): Payer: Medicare Other | Admitting: Family Medicine

## 2016-01-01 VITALS — BP 122/78 | HR 92 | Resp 16 | Ht 63.5 in | Wt 138.0 lb

## 2016-01-01 DIAGNOSIS — I10 Essential (primary) hypertension: Secondary | ICD-10-CM

## 2016-01-01 DIAGNOSIS — M549 Dorsalgia, unspecified: Secondary | ICD-10-CM

## 2016-01-01 DIAGNOSIS — M25561 Pain in right knee: Secondary | ICD-10-CM

## 2016-01-01 MED ORDER — PREDNISONE 10 MG PO TABS
ORAL_TABLET | ORAL | 0 refills | Status: DC
Start: 2016-01-01 — End: 2016-02-19

## 2016-01-01 NOTE — Patient Instructions (Signed)
F/u in October as before, call if you need me sooner  New dose prednisone  For 7 days only.  STOP prednisone 5 mg , in place is celebrex ONE daily start 5 days after you finish the prednisone   Continue twice daily hydrocodone  Thank you  for choosing [facility]. We consider it a privelige to serve you.  Delivering excellent health care in a caring and  compassionate way is our goal.  Partnering with you,  so that together we can achieve this goal is our strategy.

## 2016-01-02 ENCOUNTER — Encounter (HOSPITAL_COMMUNITY): Payer: Medicare Other | Attending: Hematology & Oncology

## 2016-01-02 ENCOUNTER — Encounter (HOSPITAL_COMMUNITY): Payer: Medicare Other

## 2016-01-02 ENCOUNTER — Encounter (HOSPITAL_BASED_OUTPATIENT_CLINIC_OR_DEPARTMENT_OTHER): Payer: Medicare Other | Admitting: Hematology & Oncology

## 2016-01-02 ENCOUNTER — Other Ambulatory Visit (HOSPITAL_COMMUNITY): Payer: Medicare Other

## 2016-01-02 VITALS — BP 110/72 | HR 72 | Temp 99.3°F | Resp 16 | Wt 137.4 lb

## 2016-01-02 DIAGNOSIS — D696 Thrombocytopenia, unspecified: Secondary | ICD-10-CM | POA: Diagnosis not present

## 2016-01-02 DIAGNOSIS — C259 Malignant neoplasm of pancreas, unspecified: Secondary | ICD-10-CM

## 2016-01-02 DIAGNOSIS — R197 Diarrhea, unspecified: Secondary | ICD-10-CM

## 2016-01-02 DIAGNOSIS — D649 Anemia, unspecified: Secondary | ICD-10-CM

## 2016-01-02 DIAGNOSIS — C787 Secondary malignant neoplasm of liver and intrahepatic bile duct: Principal | ICD-10-CM

## 2016-01-02 DIAGNOSIS — G620 Drug-induced polyneuropathy: Secondary | ICD-10-CM

## 2016-01-02 DIAGNOSIS — M25561 Pain in right knee: Secondary | ICD-10-CM | POA: Diagnosis not present

## 2016-01-02 DIAGNOSIS — K5900 Constipation, unspecified: Secondary | ICD-10-CM | POA: Diagnosis not present

## 2016-01-02 DIAGNOSIS — R634 Abnormal weight loss: Secondary | ICD-10-CM

## 2016-01-02 DIAGNOSIS — Z66 Do not resuscitate: Secondary | ICD-10-CM

## 2016-01-02 LAB — CBC
HCT: 34.1 % — ABNORMAL LOW (ref 36.0–46.0)
Hemoglobin: 11.2 g/dL — ABNORMAL LOW (ref 12.0–15.0)
MCH: 28.5 pg (ref 26.0–34.0)
MCHC: 32.8 g/dL (ref 30.0–36.0)
MCV: 86.8 fL (ref 78.0–100.0)
PLATELETS: 237 10*3/uL (ref 150–400)
RBC: 3.93 MIL/uL (ref 3.87–5.11)
RDW: 18.3 % — ABNORMAL HIGH (ref 11.5–15.5)
WBC: 7.3 10*3/uL (ref 4.0–10.5)

## 2016-01-02 LAB — COMPREHENSIVE METABOLIC PANEL
ALBUMIN: 4.1 g/dL (ref 3.5–5.0)
ALT: 13 U/L — ABNORMAL LOW (ref 14–54)
ANION GAP: 6 (ref 5–15)
AST: 15 U/L (ref 15–41)
Alkaline Phosphatase: 72 U/L (ref 38–126)
BILIRUBIN TOTAL: 1 mg/dL (ref 0.3–1.2)
BUN: 21 mg/dL — AB (ref 6–20)
CHLORIDE: 106 mmol/L (ref 101–111)
CO2: 26 mmol/L (ref 22–32)
Calcium: 9.1 mg/dL (ref 8.9–10.3)
Creatinine, Ser: 1.14 mg/dL — ABNORMAL HIGH (ref 0.44–1.00)
GFR calc Af Amer: 49 mL/min — ABNORMAL LOW (ref >60)
GFR, EST NON AFRICAN AMERICAN: 43 mL/min — AB (ref >60)
GLUCOSE: 109 mg/dL — AB (ref 65–99)
POTASSIUM: 4.1 mmol/L (ref 3.5–5.1)
Sodium: 138 mmol/L (ref 135–145)
TOTAL PROTEIN: 7.1 g/dL (ref 6.5–8.1)

## 2016-01-02 LAB — DIFFERENTIAL
Basophils Absolute: 0 10*3/uL (ref 0.0–0.1)
Basophils Relative: 0 %
EOS ABS: 0 10*3/uL (ref 0.0–0.7)
EOS PCT: 1 %
Lymphocytes Relative: 16 %
Lymphs Abs: 1.1 10*3/uL (ref 0.7–4.0)
MONO ABS: 0.5 10*3/uL (ref 0.1–1.0)
Monocytes Relative: 8 %
NEUTROS PCT: 76 %
Neutro Abs: 5.3 10*3/uL (ref 1.7–7.7)

## 2016-01-02 NOTE — Progress Notes (Signed)
Patient not to receive Aranesp injection today r/t lab results and treatment parameters.

## 2016-01-02 NOTE — Patient Instructions (Addendum)
[place] at [facility] Discharge Instructions  RECOMMENDATIONS MADE BY THE CONSULTANT AND ANY TEST RESULTS WILL BE SENT TO YOUR REFERRING PHYSICIAN.  You saw Dr. Whitney Muse today. Hold treatment today. Follow up in one week with [name] with lab work and treatment. Hold Celebrex for now.  Thank you for choosing [place] at [facility] to provide your oncology and hematology care.  To afford each patient quality time with our provider, please arrive at least 15 minutes before your scheduled appointment time.   Beginning January 23rd 2017 lab work for the [name] will be done in the  Main lab at [facility] on 1st floor. If you have a lab appointment with the [facility] please come in thru the  Main Entrance and check in at the main information desk  You need to re-schedule your appointment should you arrive 10 or more minutes late.  We strive to give you quality time with our providers, and arriving late affects you and other patients whose appointments are after yours.  Also, if you no show three or more times for appointments you may be dismissed from the clinic at the providers discretion.     Again, thank you for choosing [facility].  Our hope is that these requests will decrease the amount of time that you wait before being seen by our physicians.       _____________________________________________________________  Should you have questions after your visit to [facility], please contact our office at [phone] between the hours of 8:30 a.m. and 4:30 p.m.  Voicemails left after 4:30 p.m. will not be returned until the following business day.  For prescription refill requests, have your pharmacy contact our office.         Resources For Cancer Patients and their Caregivers ? American Cancer Society: Can assist with transportation, wigs, general needs, runs Look Good Feel Better.         209-003-3398 ? Cancer Care: Provides financial assistance, online support groups, medication/co-pay assistance.  1-800-813-HOPE 636-216-4143) ? [place] Assists [place] cancer patients and their families through emotional , educational and financial support.  [phone] ? [place] DSS Where to apply for food stamps, Medicaid and utility assistance. 714-435-4935 ? RCATS: Transportation to medical appointments. 204-167-3308 ? Social Security Administration: May apply for disability if have a Stage IV cancer. 223-581-3035 8788495642 ? [employer], Disability and Transit Services: Assists with nutrition, care and transit needs. [place] Support Programs: @10RELATIVEDAYS @ > Cancer Support Group  2nd Tuesday of the month 1pm-2pm, Journey Room  > Creative Journey  3rd Tuesday of the month 1130am-1pm, Journey Room  > Look Good Feel Better  1st Wednesday of the month 10am-12 noon, Journey Room (Call [place] to register [phone])

## 2016-01-02 NOTE — Progress Notes (Signed)
[name] Nakayama, MD [address] [place] [address]   DIAGNOSIS: Pancreatic cancer metastasized to liver   Staging form: Pancreas, AJCC 7th Edition     Clinical: Stage IV (T3, N1, M1) - Signed by Baird Cancer, PA-C on 03/16/2014     Pathologic: No stage assigned - Unsigned  CONTRAST MEDIA ALLERGY  SUMMARY OF ONCOLOGIC HISTORY:   Pancreatic cancer metastasized to liver ([facility])   03/10/2014 Initial Diagnosis    Pancreatic cancer metastasized to liver      03/22/2014 -  Chemotherapy    Abraxane/Gemzar days 1, 8, every 28 days.  Day 15 was cancelled due to leukopenia and thrombocytopenia on day 15 cycle 1.      05/24/2014 Treatment Plan Change    Day 8 of cycle 3 is held with ANC of 1.1      06/05/2014 Imaging    CT C/A/P . Interval decrease in size of the pancreatic tail mass.Improved hepatic metastatic disease. No new lesions. No CT findings for metastatic disease involving the chest.      08/09/2014 Tumor Marker    CA 19-9= 33 (WNL)      10/26/2014 Imaging    MRI- Continued interval decrease in size of the hepatic metastatic lesions and no new lesions are identified. Continued decrease in size of the pancreatic tail lesion.      01/03/2015 Tumor Marker    Results for MARTI, MCLANE (MRN [id number]) as of 01/18/2015 12:52  01/03/2015 10:00 CA 19-9: 14       01/17/2015 Imaging    MRI- Response to therapy of hepatic metastasis.  Similar size of a pancreatic tail lesion.      03/20/2015 Imaging    MRI-L spine- Severe disc space narrowing at L2-L3, with endplate reactive changes. Large disc extrusion into the ventral epidural space,central to the RIGHT with a cephalad migrated free fragment. Additional Large disc extrusion into the retroperitone...      03/22/2015 Imaging    CT pelvis- No evidence for metastatic disease within the pelvis.      07/24/2015 Imaging    MRI abd- Continued response to therapy, with no residual detectable liver metastases. No  new sites of metastatic disease in the abdomen.       08/15/2015 Code Status     She confirms desire for DNR status.      12/21/2015 Imaging    MRI liver- Severe image degradation due to motion artifact reducing diagnostic sensitivity and specificity. Reduce conspicuity of the pancreatic tail lesion suggesting further improvement. The original liver lesions have resolved.       CURRENT THERAPY: Gemzar/Abraxane  INTERVAL HISTORY: [name] 82 y.o. female returns for follow-up of her pancreatic cancer. She is doing fairly well. She notes that she attends church weekly. She does have fatigue. Major complaint revolves around her R knee.   [name] is unaccompanied and in a wheelchair. I personally reviewed and went over laboratory studies with the patient.  Reports her knee has been giving her trouble. States her right knee always feels like it is "spraying water" but it not wet to the touch. She has seen a physician about this. She saw Dr. Moshe Cipro yesterday and was prescribed prednisone for 7 days. She was told to hold Celebrex, she had been taking it for around 10 days. She takes hydrocodone twice daily.  Reports the medication she was given for her appetite had helped and actually increased her appetite. She denies having diarrhea.  Although this has been a significant issue in the past.  She denies abdominal pain.    MEDICAL HISTORY: Past Medical History:  Diagnosis Date  . Anemia due to antineoplastic chemotherapy 09/12/2015   Started Aranesp 500 mcg on 09/12/2015  . Anxiety   . Chronic diarrhea   . Depression   . DNR (do not resuscitate) 08/16/2015  . Erosive esophagitis   . GERD (gastroesophageal reflux disease)   . Hx of adenomatous colonic polyps    tubular adenomas, last found in 2008  . Hyperplastic colon polyp 03/19/10   tcs by Dr. Gala Romney  . Hypertension 20 years   . Kidney stone    hx/ crushed   . Opioid contract exists 04/18/2015   With Dr. Moshe Cipro  . Pancreatic  cancer ([facility]) 02/2014  . Pancreatic cancer metastasized to liver ([facility]) 03/10/2014  . Schatzki's ring 08/26/10   Last dilated on EGD by Dr. Trevor I[facility], linear gastric erosions, BI hemigastrectomy    has Essential hypertension; GERD (gastroesophageal reflux disease); Anxiety; Primary generalized (osteo)arthritis; Back pain with radiation; IGT (impaired glucose tolerance); Anemia, iron deficiency; Elevated LFTs; Abnormal liver ultrasound; Pancreatic cancer metastasized to liver [facility]); Allergic cough; Allergic rhinitis; Heme positive stool; Right knee pain; Opioid contract exists; Hx of adenomatous colonic polyps; Chronic low back pain with sciatica; DNR (do not resuscitate); Anemia due to antineoplastic chemotherapy; Medicare annual wellness visit, subsequent; and At high risk for falls on her problem list.     is allergic to iohexol; aciphex [rabeprazole sodium]; amlodipine besylate-valsartan; esomeprazole magnesium; omeprazole; penicillins; and ciprofloxacin.  [name] does not currently have medications on file.  SURGICAL HISTORY: Past Surgical History:  Procedure Laterality Date  . BREAST LUMPECTOMY Left   . bunion removal     from both feet   . CHOLECYSTECTOMY  1965   . COLONOSCOPY  03/19/2010   DR Gala Romney,, normal TI, pancolonic diverticula, random colon bx neg., hyperplastic polyps removed  . ESOPHAGOGASTRODUODENOSCOPY  11/22/2003   DR Gala Romney, erosive RE, Billroth I  . ESOPHAGOGASTRODUODENOSCOPY  08/26/10   Dr. Gala Romney- moderate severe ERE, Scahtzki ring s/p dilation, Billroth I, linear gastric erosions, bx-gastric xanthelasma  . LEFT SHOULDER SURGERY  2009   DR HARRISON  . ORIF ANKLE FRACTURE Right 05/22/2013   Procedure: OPEN REDUCTION INTERNAL FIXATION (ORIF) RIGHT ANKLE FRACTURE;  Surgeon: Sanjuana Kava, MD;  Location: [facility];  Service: Orthopedics;  Laterality: Right;  . stomach ulcer  50 years ago    had some of her stomach removed     SOCIAL HISTORY: Social History   Social  History  . Marital status: Widowed    Spouse name: N/A  . Number of children: 2  . Years of education: N/A   Occupational History  . retired from Teacher, adult education Retired   Social History Main Topics  . Smoking status: Former Smoker    Types: Cigarettes    Quit date: 11/09/2011  . Smokeless tobacco: Never Used     Comment: quit a few weeks ago   . Alcohol use No  . Drug use: No  . Sexual activity: No   Other Topics Concern  . Not on file   Social History Narrative  . No narrative on file    FAMILY HISTORY: Family History  Problem Relation Age of Onset  . Hypertension Mother   . Kidney failure Brother     X1 ON DIALYSIS  . Diabetes Sister   . Cancer Sister     X2  . Hypertension Father   .  Liver disease Neg Hx   . Colon cancer Neg Hx     Review of Systems  Constitutional: Negative for malaise/fatigue. HENT: Negative. Eyes: Positive for dry eyes. Negative for vision change, double vision, photophobia, pain, discharge and redness. Dry eyes managed with eye drops.  Respiratory: Negative.   Cardiovascular: Negative.   Gastrointestinal: Denies today Genitourinary: Negative.  Musculoskeletal: Positive for joint pain and back pain.  Knee pain. Skin: Negative.   Neurological: Negative for weakness, dizziness, tingling, tremors, sensory change, speech change, focal weakness, seizures and loss of consciousness. Endo/Heme/Allergies: Negative.   Psychiatric/Behavioral: Negative.   14 point review of systems was performed and is negative except as detailed under history of present illness and above  PHYSICAL EXAMINATION  ECOG PERFORMANCE STATUS: 1 - Symptomatic but completely ambulatory  Vitals with BMI 01/02/2016  Height   Weight 137 lbs 6 oz  BMI   Systolic 979  Diastolic 72  Pulse 72  Respirations 16    Physical Exam  Constitutional: She is oriented to person, place, and time and well-developed, well-nourished, and in no distress. In wheelchair today Well  groomed. HENT: Negative Head: Normocephalic and atraumatic.  Nose: Nose normal.  Mouth/Throat: Oropharynx is clear and moist. No oropharyngeal exudate.  Eyes: Conjunctivae and EOM are normal. Pupils are equal, round, and reactive to light. Right eye exhibits no discharge. Left eye exhibits no discharge. No scleral icterus.  Neck: Normal range of motion. Neck supple. No tracheal deviation present. No thyromegaly present.  Cardiovascular: Normal rate, regular rhythm and normal heart sounds.  Exam reveals no gallop and no friction rub.   No murmur heard. Pulmonary/Chest: Effort normal and breath sounds normal. She has no wheezes. She has no rales.  Abdominal: Soft. Bowel sounds are normal. She exhibits no distension and no mass. There is no tenderness. There is no rebound and no guarding.  Musculoskeletal: Normal range of motion. She exhibits no edema.  Lymphadenopathy:    She has no cervical adenopathy.  Neurological: She is alert and oriented to person, place, and time.No cranial nerve deficit. Skin: Skin is warm and dry. No rash noted.  Psychiatric: Mood, memory, affect and judgment normal.  Nursing note and vitals reviewed.   LABORATORY DATA: I have reviewed the data as listed.  Results for [name], [name] (MRN [id number])  Results for [name], [name] (MRN [id number]) as of 01/03/2016 18:26  Ref. Range 01/02/2016 08:50  Sodium Latest Ref Range: 135 - 145 mmol/L 138  Potassium Latest Ref Range: 3.5 - 5.1 mmol/L 4.1  Chloride Latest Ref Range: 101 - 111 mmol/L 106  CO2 Latest Ref Range: 22 - 32 mmol/L 26  BUN Latest Ref Range: 6 - 20 mg/dL 21 (H)  Creatinine Latest Ref Range: 0.44 - 1.00 mg/dL 1.14 (H)  Calcium Latest Ref Range: 8.9 - 10.3 mg/dL 9.1  EGFR (Non-African Amer.) Latest Ref Range: >60 mL/min 43 (L)  EGFR (African American) Latest Ref Range: >60 mL/min 49 (L)  Glucose Latest Ref Range: 65 - 99 mg/dL 109 (H)  Anion gap Latest Ref Range: 5 - 15  6  Alkaline Phosphatase  Latest Ref Range: 38 - 126 U/L 72  Albumin Latest Ref Range: 3.5 - 5.0 g/dL 4.1  AST Latest Ref Range: 15 - 41 U/L 15  ALT Latest Ref Range: 14 - 54 U/L 13 (L)  Total Protein Latest Ref Range: 6.5 - 8.1 g/dL 7.1  Total Bilirubin Latest Ref Range: 0.3 - 1.2 mg/dL 1.0  WBC Latest Ref Range: 4.0 -  10.5 K/uL 7.3  RBC Latest Ref Range: 3.87 - 5.11 MIL/uL 3.93  Hemoglobin Latest Ref Range: 12.0 - 15.0 g/dL 11.2 (L)  HCT Latest Ref Range: 36.0 - 46.0 % 34.1 (L)  MCV Latest Ref Range: 78.0 - 100.0 fL 86.8  MCH Latest Ref Range: 26.0 - 34.0 pg 28.5  MCHC Latest Ref Range: 30.0 - 36.0 g/dL 32.8  RDW Latest Ref Range: 11.5 - 15.5 % 18.3 (H)  Platelets Latest Ref Range: 150 - 400 K/uL 237  Neutrophils Latest Units: % 76  Lymphocytes Latest Units: % 16  Monocytes Relative Latest Units: % 8  Eosinophil Latest Units: % 1  Basophil Latest Units: % 0  NEUT# Latest Ref Range: 1.7 - 7.7 K/uL 5.3  Lymphocyte # Latest Ref Range: 0.7 - 4.0 K/uL 1.1  Monocyte # Latest Ref Range: 0.1 - 1.0 K/uL 0.5  Eosinophils Absolute Latest Ref Range: 0.0 - 0.7 K/uL 0.0  Basophils Absolute Latest Ref Range: 0.0 - 0.1 K/uL 0.0  CA 19-9 Latest Ref Range: 0 - 35 U/mL 21    RADIOLOGY: I have personally reviewed the radiological images as listed and agreed with the findings in the report.  Study Result   CLINICAL DATA:  Pancreatic cancer, metastatic disease to the liver. Weight loss and diarrhea for 3 months.  EXAM: MRI ABDOMEN WITHOUT AND WITH CONTRAST (INCLUDING MRCP)  TECHNIQUE: Multiplanar multisequence MR imaging of the abdomen was performed both before and after the administration of intravenous contrast. Heavily T2-weighted images of the biliary and pancreatic ducts were obtained, and three-dimensional MRCP images were rendered by post processing.  CONTRAST:  75m MULTIHANCE GADOBENATE DIMEGLUMINE 529 MG/ML IV SOLN  COMPARISON:  Multiple exams, including 07/24/2015 and  03/02/2014  FINDINGS: Despite efforts by the technologist and patient, motion artifact is present on today's exam and could not be eliminated. This reduces exam sensitivity and specificity.  Lower chest:  Mild cardiomegaly.  Hepatobiliary: The metastatic lesions shown in the liver on 03/02/2014 are not apparent on today' s exam. This is a stable appearance compared to the more recent comparison of 07/24/2015. There is continued mild intrahepatic and extrahepatic biliary dilatation.  Pancreas: Mildly restricted diffusion in the pancreatic tail at the site of the prior malignancy. Some of the heterogeneous enhancement in the vicinity of the known mass is due to splenic vasculature along the splenic hilum, which is difficult to separate from the mass itself. There is reduce conspicuity of the mass compared to the prior exam, although this is partly due to the severity of motion artifact. Overall I believe the appearance is slightly improved. No pancreatic duct dilatation.  Spleen: Unremarkable  Adrenals/Urinary Tract: Bilateral small renal cysts are present. This includes a 2.4 by 1.4 cm Bosniak category 2 cyst of the left mid kidney medially, and a 1.2 cm Bosniak category 2 cyst of the left mid kidney laterally. Adrenal glands unremarkable.  Stomach/Bowel: Unremarkable  Vascular/Lymphatic: Atherosclerosis of the abdominal aorta. Atherosclerotic narrowing of the celiac trunk origin. No pathologic adenopathy identified.  Other: No supplemental non-categorized findings.  Musculoskeletal: Lumbar spondylosis and degenerative disc disease.  IMPRESSION: 1. Severe image degradation due to motion artifact reducing diagnostic sensitivity and specificity. 2. Reduce conspicuity of the pancreatic tail lesion suggesting further improvement. Aside from some mild restricted diffusion on diffusion-weighted images, the prior lesion is not readily apparent (although some of this  may be attributable to the severity of motion artifact). In addition, the original liver lesions have resolved with no current liver lesions  visible. 3. Other imaging findings of potential clinical significance: Mild cardiomegaly. Stable biliary dilatation. Innumerable tiny renal cysts with several Bosniak category 2 cysts but no observed plate worrisome renal lesions. Considerable arterial atherosclerosis. Lumbar spondylosis and degenerative disc disease.   Electronically Signed   By: Van Clines M.D.   On: 12/21/2015 12:55     ASSESSMENT and THERAPY PLAN:   STAGE IV Pancreatic Cancer Ongoing response to therapy Mild chemotherapy induced neuropathy Anemia Hemoccult positive stool Thrombocytopenia Weight loss Diarrhea/constipation Chronic R knee pain  She feels her appetite is improved, Weight is stable. I am however giving her this week off from therapy. She appears fatigued. Creatinine is slightly up and I suspect this is from her recent celebrex use. She has been on therapy for many months. I encouraged her that a week off will not affect her long term care.   She will return for follow up next week with treatment.   All questions were answered. The patient knows to call the clinic with any problems, questions or concerns. We can certainly see the patient much sooner if necessary.  This document serves as a record of services personally performed by Ancil Linsey, MD. It was created on her behalf by Arlyce Harman, a trained medical scribe. The creation of this record is based on the scribe's personal observations and the provider's statements to them. This document has been checked and approved by the attending provider.  I have reviewed the above documentation for accuracy and completeness, and I agree with the above.  This note was signed electronically. Molli Hazard, MD  01/02/2016

## 2016-01-02 NOTE — Progress Notes (Signed)
No treatment today per MD discretion.

## 2016-01-03 ENCOUNTER — Encounter (HOSPITAL_COMMUNITY): Payer: Self-pay | Admitting: Hematology & Oncology

## 2016-01-03 ENCOUNTER — Telehealth (HOSPITAL_COMMUNITY): Payer: Self-pay | Admitting: *Deleted

## 2016-01-03 LAB — CANCER ANTIGEN 19-9: CA 19-9: 21 U/mL (ref 0–35)

## 2016-01-03 NOTE — Telephone Encounter (Signed)
-----   Message from Baird Cancer, PA-C sent at 01/03/2016  2:46 PM EDT ----- Stable

## 2016-01-03 NOTE — Telephone Encounter (Signed)
Pt aware labs are stable.  

## 2016-01-09 ENCOUNTER — Encounter (HOSPITAL_BASED_OUTPATIENT_CLINIC_OR_DEPARTMENT_OTHER): Payer: Medicare Other

## 2016-01-09 ENCOUNTER — Encounter (HOSPITAL_COMMUNITY): Payer: Self-pay | Admitting: Oncology

## 2016-01-09 ENCOUNTER — Encounter (HOSPITAL_BASED_OUTPATIENT_CLINIC_OR_DEPARTMENT_OTHER): Payer: Medicare Other | Admitting: Oncology

## 2016-01-09 ENCOUNTER — Encounter (HOSPITAL_COMMUNITY): Payer: Medicare Other

## 2016-01-09 VITALS — BP 160/69 | HR 60 | Temp 98.1°F | Resp 18

## 2016-01-09 DIAGNOSIS — C787 Secondary malignant neoplasm of liver and intrahepatic bile duct: Principal | ICD-10-CM

## 2016-01-09 DIAGNOSIS — Z5111 Encounter for antineoplastic chemotherapy: Secondary | ICD-10-CM

## 2016-01-09 DIAGNOSIS — C259 Malignant neoplasm of pancreas, unspecified: Secondary | ICD-10-CM

## 2016-01-09 LAB — COMPREHENSIVE METABOLIC PANEL
ALBUMIN: 3.6 g/dL (ref 3.5–5.0)
ALT: 21 U/L (ref 14–54)
AST: 19 U/L (ref 15–41)
Alkaline Phosphatase: 70 U/L (ref 38–126)
Anion gap: 1 — ABNORMAL LOW (ref 5–15)
BUN: 22 mg/dL — AB (ref 6–20)
CHLORIDE: 109 mmol/L (ref 101–111)
CO2: 25 mmol/L (ref 22–32)
Calcium: 8.1 mg/dL — ABNORMAL LOW (ref 8.9–10.3)
Creatinine, Ser: 1.02 mg/dL — ABNORMAL HIGH (ref 0.44–1.00)
GFR calc Af Amer: 56 mL/min — ABNORMAL LOW (ref >60)
GFR calc non Af Amer: 49 mL/min — ABNORMAL LOW (ref >60)
GLUCOSE: 102 mg/dL — AB (ref 65–99)
POTASSIUM: 4.2 mmol/L (ref 3.5–5.1)
Sodium: 135 mmol/L (ref 135–145)
Total Bilirubin: 1.1 mg/dL (ref 0.3–1.2)
Total Protein: 6.3 g/dL — ABNORMAL LOW (ref 6.5–8.1)

## 2016-01-09 LAB — DIFFERENTIAL
Basophils Absolute: 0 10*3/uL (ref 0.0–0.1)
Basophils Relative: 0 %
EOS PCT: 1 %
Eosinophils Absolute: 0.1 10*3/uL (ref 0.0–0.7)
LYMPHS ABS: 2.4 10*3/uL (ref 0.7–4.0)
LYMPHS PCT: 17 %
MONO ABS: 1.1 10*3/uL — AB (ref 0.1–1.0)
MONOS PCT: 8 %
Neutro Abs: 10.7 10*3/uL — ABNORMAL HIGH (ref 1.7–7.7)
Neutrophils Relative %: 74 %

## 2016-01-09 LAB — CBC
HEMATOCRIT: 36.6 % (ref 36.0–46.0)
Hemoglobin: 11.9 g/dL — ABNORMAL LOW (ref 12.0–15.0)
MCH: 28.3 pg (ref 26.0–34.0)
MCHC: 32.5 g/dL (ref 30.0–36.0)
MCV: 86.9 fL (ref 78.0–100.0)
PLATELETS: 184 10*3/uL (ref 150–400)
RBC: 4.21 MIL/uL (ref 3.87–5.11)
RDW: 18.3 % — AB (ref 11.5–15.5)
WBC: 14.3 10*3/uL — AB (ref 4.0–10.5)

## 2016-01-09 MED ORDER — HEPARIN SOD (PORK) LOCK FLUSH 100 UNIT/ML IV SOLN
500.0000 [IU] | Freq: Once | INTRAVENOUS | Status: AC | PRN
  Administered 2016-01-09: 500 [IU]

## 2016-01-09 MED ORDER — PACLITAXEL PROTEIN-BOUND CHEMO INJECTION 100 MG
125.0000 mg/m2 | Freq: Once | INTRAVENOUS | Status: AC
Start: 2016-01-09 — End: 2016-01-09
  Administered 2016-01-09: 225 mg via INTRAVENOUS
  Filled 2016-01-09: qty 20

## 2016-01-09 MED ORDER — SODIUM CHLORIDE 0.9 % IV SOLN
1000.0000 mg/m2 | Freq: Once | INTRAVENOUS | Status: AC
  Administered 2016-01-09: 1786 mg via INTRAVENOUS
  Filled 2016-01-09: qty 20.7

## 2016-01-09 MED ORDER — SODIUM CHLORIDE 0.9 % IV SOLN
Freq: Once | INTRAVENOUS | Status: AC
  Administered 2016-01-09: 10:00:00 via INTRAVENOUS

## 2016-01-09 MED ORDER — ONDANSETRON HCL 40 MG/20ML IJ SOLN
Freq: Once | INTRAMUSCULAR | Status: AC
  Administered 2016-01-09: 8 mg via INTRAVENOUS
  Filled 2016-01-09: qty 4

## 2016-01-09 MED ORDER — SODIUM CHLORIDE 0.9 % IJ SOLN: 10.0000 mL | INTRAMUSCULAR | Status: DC | PRN

## 2016-01-09 NOTE — Patient Instructions (Signed)
[facility] Discharge Instructions for Patients Receiving Chemotherapy   Beginning January 23rd 2017 lab work for the [facility] will be done in the  Main lab at [facility] on 1st floor. If you have a lab appointment with the [facility] please come in thru the  Main Entrance and check in at the main information desk   Today you received the following chemotherapy agents:  Abraxane and Gemzar  To help prevent nausea and vomiting after your treatment, we encourage you to take your nausea medication as directed.  If you develop nausea and vomiting, or diarrhea that is not controlled by your medication, call the clinic.  The clinic phone number is [phone]. Office hours are Monday-Friday 8:30am-5:00pm.  BELOW ARE SYMPTOMS THAT SHOULD BE REPORTED IMMEDIATELY:  *FEVER GREATER THAN 101.0 F  *CHILLS WITH OR WITHOUT FEVER  NAUSEA AND VOMITING THAT IS NOT CONTROLLED WITH YOUR NAUSEA MEDICATION  *UNUSUAL SHORTNESS OF BREATH  *UNUSUAL BRUISING OR BLEEDING  TENDERNESS IN MOUTH AND THROAT WITH OR WITHOUT PRESENCE OF ULCERS  *URINARY PROBLEMS  *BOWEL PROBLEMS  UNUSUAL RASH Items with * indicate a potential emergency and should be followed up as soon as possible. If you have an emergency after office hours please contact your primary care physician or go to the nearest emergency department.  Please call the clinic during office hours if you have any questions or concerns.   You may also contact the Patient Navigator at [phone] should you have any questions or need assistance in obtaining follow up care.      Resources For Cancer Patients and their Caregivers ? American Cancer Society: Can assist with transportation, wigs, general needs, runs Look Good Feel Better.        612-385-7621 ? Cancer Care: Provides financial assistance, online support groups, medication/co-pay assistance.  1-800-813-HOPE 612-599-3520) ? [facility] Assists [place] cancer patients and their families through emotional , educational and financial support.  737 289 5288 ? [place] DSS Where to apply for food stamps, Medicaid and utility assistance. 337-722-1535 ? [facility]: Transportation to medical appointments. 202 821 9529 ? Social Security Administration: May apply for disability if have a Stage IV cancer. 919-806-9809 951-341-8081 ? [employer], Disability and Transit Services: Assists with nutrition, care and transit needs. 901-635-7359

## 2016-01-09 NOTE — Progress Notes (Signed)
[name] Nakayama, MD [address] [place] [address]  Pancreatic cancer metastasized to liver [facility])  CURRENT THERAPY: Palliative Gemcitabine/Abraxane  INTERVAL HISTORY: [name] 82 y.o. female returns for followup of Stage IV pancreatic cancer.    Pancreatic cancer metastasized to liver ([place])   03/10/2014 Initial Diagnosis    Pancreatic cancer metastasized to liver      03/22/2014 -  Chemotherapy    Abraxane/Gemzar days 1, 8, every 28 days.  Day 15 was cancelled due to leukopenia and thrombocytopenia on day 15 cycle 1.      05/24/2014 Treatment Plan Change    Day 8 of cycle 3 is held with ANC of 1.1      06/05/2014 Imaging    CT C/A/P . Interval decrease in size of the pancreatic tail mass.Improved hepatic metastatic disease. No new lesions. No CT findings for metastatic disease involving the chest.      08/09/2014 Tumor Marker    CA 19-9= 33 (WNL)      10/26/2014 Imaging    MRI- Continued interval decrease in size of the hepatic metastatic lesions and no new lesions are identified. Continued decrease in size of the pancreatic tail lesion.      01/03/2015 Tumor Marker    Results for LADASHA, COPELAN (MRN [id number]) as of 01/18/2015 12:52  01/03/2015 10:00 CA 19-9: 14       01/17/2015 Imaging    MRI- Response to therapy of hepatic metastasis.  Similar size of a pancreatic tail lesion.      03/20/2015 Imaging    MRI-L spine- Severe disc space narrowing at L2-L3, with endplate reactive changes. Large disc extrusion into the ventral epidural space,central to the RIGHT with a cephalad migrated free fragment. Additional Large disc extrusion into the retroperitone...      03/22/2015 Imaging    CT pelvis- No evidence for metastatic disease within the pelvis.      07/24/2015 Imaging    MRI abd- Continued response to therapy, with no residual detectable liver metastases. No new sites of metastatic disease in the abdomen.       08/15/2015 Code  Status     She confirms desire for DNR status.      12/21/2015 Imaging    MRI liver- Severe image degradation due to motion artifact reducing diagnostic sensitivity and specificity. Reduce conspicuity of the pancreatic tail lesion suggesting further improvement. The original liver lesions have resolved.       She is interested in having the rest of her upper teeth removed.  She reports only having 5 left.  From an oncology standpoint, that will be fine.  She is advised to have this performed as close as possible to the end of her cycle of chemotherapy and call us with a date.  She will then see [place] for a lab check prior to her appointment.  She denies any complaints today.  Review of Systems  Constitutional: Negative for chills and fever.  HENT: Negative.   Eyes: Negative.   Respiratory: Negative.  Negative for cough.   Cardiovascular: Negative.  Negative for chest pain.  Gastrointestinal: Positive for diarrhea (follows constipation after chemotherapy.). Negative for nausea and vomiting.  Genitourinary: Negative.  Negative for dysuria, frequency and hematuria.  Musculoskeletal: Positive for back pain and joint pain (Knee pain followed by Dr. Luna Glasgow). Negative for falls.  Skin: Negative.   Neurological: Negative.  Negative for headaches.  Endo/Heme/Allergies: Negative.   Psychiatric/Behavioral: Negative.  Past Medical History:  Diagnosis Date  . Anemia due to antineoplastic chemotherapy 09/12/2015   Started Aranesp 500 mcg on 09/12/2015  . Anxiety   . Chronic diarrhea   . Depression   . DNR (do not resuscitate) 08/16/2015  . Erosive esophagitis   . GERD (gastroesophageal reflux disease)   . Hx of adenomatous colonic polyps    tubular adenomas, last found in 2008  . Hyperplastic colon polyp 03/19/10   tcs by Dr. Gala Romney  . Hypertension 20 years   . Kidney stone    hx/ crushed   . Opioid contract exists 04/18/2015   With Dr. Moshe Cipro  . Pancreatic cancer ([place]) 02/2014  .  Pancreatic cancer metastasized to liver ([place]) 03/10/2014  . Schatzki's ring 08/26/10   Last dilated on EGD by Dr. Trevor Iha [facility], linear gastric erosions, BI hemigastrectomy    Past Surgical History:  Procedure Laterality Date  . BREAST LUMPECTOMY Left   . bunion removal     from both feet   . CHOLECYSTECTOMY  1965   . COLONOSCOPY  03/19/2010   DR Gala Romney,, normal TI, pancolonic diverticula, random colon bx neg., hyperplastic polyps removed  . ESOPHAGOGASTRODUODENOSCOPY  11/22/2003   DR Gala Romney, erosive RE, Billroth I  . ESOPHAGOGASTRODUODENOSCOPY  08/26/10   Dr. Gala Romney- moderate severe ERE, Scahtzki ring s/p dilation, Billroth I, linear gastric erosions, bx-gastric xanthelasma  . LEFT SHOULDER SURGERY  2009   DR HARRISON  . ORIF ANKLE FRACTURE Right 05/22/2013   Procedure: OPEN REDUCTION INTERNAL FIXATION (ORIF) RIGHT ANKLE FRACTURE;  Surgeon: Sanjuana Kava, MD;  Location: [facility];  Service: Orthopedics;  Laterality: Right;  . stomach ulcer  50 years ago    had some of her stomach removed     Family History  Problem Relation Age of Onset  . Hypertension Mother   . Kidney failure Brother     X1 ON DIALYSIS  . Diabetes Sister   . Cancer Sister     X2  . Hypertension Father   . Liver disease Neg Hx   . Colon cancer Neg Hx     Social History   Social History  . Marital status: Widowed    Spouse name: N/A  . Number of children: 2  . Years of education: N/A   Occupational History  . retired from Teacher, adult education Retired   Social History Main Topics  . Smoking status: Former Smoker    Types: Cigarettes    Quit date: 11/09/2011  . Smokeless tobacco: Never Used     Comment: quit a few weeks ago   . Alcohol use No  . Drug use: No  . Sexual activity: No   Other Topics Concern  . None   Social History Narrative  . None     PHYSICAL EXAMINATION  ECOG PERFORMANCE STATUS: 1 - Symptomatic but completely ambulatory  Vitals:   01/09/16 0900  BP: (!) 159/69  Pulse: 60  Resp: 18    Temp: 98.3 F (36.8 C)     GENERAL:alert, no distress, comfortable, cooperative, smiling and unaccompanied. SKIN: skin color, texture, turgor are normal, no rashes or significant lesions. HEAD: Normocephalic, No masses, lesions, tenderness or abnormalities EYES: normal, EOMI, Conjunctiva are pink and non-injected EARS: External ears normal OROPHARYNX:mucous membranes are moist  NECK: supple, no adenopathy, trachea midline LYMPH:  not examined BREAST:not examined LUNGS: clear to auscultation and percussion without wheezes, rales, or rhonchi.  HEART: regular rate & rhythm, no murmurs, no gallops, S1 normal and S2 normal ABDOMEN:abdomen soft,  non-tender and normal bowel sounds BACK: Back symmetric, no curvature. EXTREMITIES:less then 2 second capillary refill, no joint deformities, effusion, or inflammation, no edema, no skin discoloration, no clubbing, no cyanosis  NEURO: alert & oriented x 3 with fluent speech, no focal motor/sensory deficits, gait normal   LABORATORY DATA: CBC    Component Value Date/Time   WBC 14.3 (H) 01/09/2016 0826   RBC 4.21 01/09/2016 0826   HGB 11.9 (L) 01/09/2016 0826   HCT 36.6 01/09/2016 0826   PLT 184 01/09/2016 0826   MCV 86.9 01/09/2016 0826   MCH 28.3 01/09/2016 0826   MCHC 32.5 01/09/2016 0826   RDW 18.3 (H) 01/09/2016 0826   LYMPHSABS 2.4 01/09/2016 0826   MONOABS 1.1 (H) 01/09/2016 0826   EOSABS 0.1 01/09/2016 0826   BASOSABS 0.0 01/09/2016 0826      Chemistry      Component Value Date/Time   NA 138 01/02/2016 0850   K 4.1 01/02/2016 0850   CL 106 01/02/2016 0850   CO2 26 01/02/2016 0850   BUN 21 (H) 01/02/2016 0850   CREATININE 1.14 (H) 01/02/2016 0850   CREATININE 0.97 02/14/2014 0758      Component Value Date/Time   CALCIUM 9.1 01/02/2016 0850   ALKPHOS 72 01/02/2016 0850   AST 15 01/02/2016 0850   ALT 13 (L) 01/02/2016 0850   BILITOT 1.0 01/02/2016 0850      PENDING LABS:   RADIOGRAPHIC STUDIES:  Mr Abdomen W Wo  Contrast  Result Date: 12/21/2015 CLINICAL DATA:  Pancreatic cancer, metastatic disease to the liver. Weight loss and diarrhea for 3 months. EXAM: MRI ABDOMEN WITHOUT AND WITH CONTRAST (INCLUDING MRCP) TECHNIQUE: Multiplanar multisequence MR imaging of the abdomen was performed both before and after the administration of intravenous contrast. Heavily T2-weighted images of the biliary and pancreatic ducts were obtained, and three-dimensional MRCP images were rendered by post processing. CONTRAST:  22mL MULTIHANCE GADOBENATE DIMEGLUMINE 529 MG/ML IV SOLN COMPARISON:  Multiple exams, including 07/24/2015 and 03/02/2014 FINDINGS: Despite efforts by the technologist and patient, motion artifact is present on today's exam and could not be eliminated. This reduces exam sensitivity and specificity. Lower chest:  Mild cardiomegaly. Hepatobiliary: The metastatic lesions shown in the liver on 03/02/2014 are not apparent on today' s exam. This is a stable appearance compared to the more recent comparison of 07/24/2015. There is continued mild intrahepatic and extrahepatic biliary dilatation. Pancreas: Mildly restricted diffusion in the pancreatic tail at the site of the prior malignancy. Some of the heterogeneous enhancement in the vicinity of the known mass is due to splenic vasculature along the splenic hilum, which is difficult to separate from the mass itself. There is reduce conspicuity of the mass compared to the prior exam, although this is partly due to the severity of motion artifact. Overall I believe the appearance is slightly improved. No pancreatic duct dilatation. Spleen: Unremarkable Adrenals/Urinary Tract: Bilateral small renal cysts are present. This includes a 2.4 by 1.4 cm Bosniak category 2 cyst of the left mid kidney medially, and a 1.2 cm Bosniak category 2 cyst of the left mid kidney laterally. Adrenal glands unremarkable. Stomach/Bowel: Unremarkable Vascular/Lymphatic: Atherosclerosis of the abdominal  aorta. Atherosclerotic narrowing of the celiac trunk origin. No pathologic adenopathy identified. Other: No supplemental non-categorized findings. Musculoskeletal: Lumbar spondylosis and degenerative disc disease. IMPRESSION: 1. Severe image degradation due to motion artifact reducing diagnostic sensitivity and specificity. 2. Reduce conspicuity of the pancreatic tail lesion suggesting further improvement. Aside from some mild restricted diffusion on  diffusion-weighted images, the prior lesion is not readily apparent (although some of this may be attributable to the severity of motion artifact). In addition, the original liver lesions have resolved with no current liver lesions visible. 3. Other imaging findings of potential clinical significance: Mild cardiomegaly. Stable biliary dilatation. Innumerable tiny renal cysts with several Bosniak category 2 cysts but no observed plate worrisome renal lesions. Considerable arterial atherosclerosis. Lumbar spondylosis and degenerative disc disease. Electronically Signed   By: Van Clines M.D.   On: 12/21/2015 12:55   Mr 3d Recon At Scanner  Result Date: 12/21/2015 CLINICAL DATA:  Pancreatic cancer, metastatic disease to the liver. Weight loss and diarrhea for 3 months. EXAM: MRI ABDOMEN WITHOUT AND WITH CONTRAST (INCLUDING MRCP) TECHNIQUE: Multiplanar multisequence MR imaging of the abdomen was performed both before and after the administration of intravenous contrast. Heavily T2-weighted images of the biliary and pancreatic ducts were obtained, and three-dimensional MRCP images were rendered by post processing. CONTRAST:  63mL MULTIHANCE GADOBENATE DIMEGLUMINE 529 MG/ML IV SOLN COMPARISON:  Multiple exams, including 07/24/2015 and 03/02/2014 FINDINGS: Despite efforts by the technologist and patient, motion artifact is present on today's exam and could not be eliminated. This reduces exam sensitivity and specificity. Lower chest:  Mild cardiomegaly.  Hepatobiliary: The metastatic lesions shown in the liver on 03/02/2014 are not apparent on today' s exam. This is a stable appearance compared to the more recent comparison of 07/24/2015. There is continued mild intrahepatic and extrahepatic biliary dilatation. Pancreas: Mildly restricted diffusion in the pancreatic tail at the site of the prior malignancy. Some of the heterogeneous enhancement in the vicinity of the known mass is due to splenic vasculature along the splenic hilum, which is difficult to separate from the mass itself. There is reduce conspicuity of the mass compared to the prior exam, although this is partly due to the severity of motion artifact. Overall I believe the appearance is slightly improved. No pancreatic duct dilatation. Spleen: Unremarkable Adrenals/Urinary Tract: Bilateral small renal cysts are present. This includes a 2.4 by 1.4 cm Bosniak category 2 cyst of the left mid kidney medially, and a 1.2 cm Bosniak category 2 cyst of the left mid kidney laterally. Adrenal glands unremarkable. Stomach/Bowel: Unremarkable Vascular/Lymphatic: Atherosclerosis of the abdominal aorta. Atherosclerotic narrowing of the celiac trunk origin. No pathologic adenopathy identified. Other: No supplemental non-categorized findings. Musculoskeletal: Lumbar spondylosis and degenerative disc disease. IMPRESSION: 1. Severe image degradation due to motion artifact reducing diagnostic sensitivity and specificity. 2. Reduce conspicuity of the pancreatic tail lesion suggesting further improvement. Aside from some mild restricted diffusion on diffusion-weighted images, the prior lesion is not readily apparent (although some of this may be attributable to the severity of motion artifact). In addition, the original liver lesions have resolved with no current liver lesions visible. 3. Other imaging findings of potential clinical significance: Mild cardiomegaly. Stable biliary dilatation. Innumerable tiny renal cysts with  several Bosniak category 2 cysts but no observed plate worrisome renal lesions. Considerable arterial atherosclerosis. Lumbar spondylosis and degenerative disc disease. Electronically Signed   By: Van Clines M.D.   On: 12/21/2015 12:55     PATHOLOGY:    ASSESSMENT AND PLAN:  Pancreatic cancer metastasized to liver Stage IV Pancreatic Cancer, on systemic therapy with Abraxane and Gemzar in a day 1, 8 fashion every 28 days.  Continued response to therapy.  Oncology history is updated.   Patient has entered into a pain contract with her primary care provider for non-oncologic pain.  She has been followed  by Dr. Luna Glasgow (Ortho) for her knee and ankle pain.    Pre-chemo labs as ordered: CBC diff, CMET, CA 19-9.   I personally reviewed and went over laboratory results with the patient.  The results are noted within this dictation.    DNR  She has not had chest imaging in > 12 months.  Order for CT of chest is ordered.  This is scheduled for 01/22/2016.  She would like to have some teeth extracted.  She has about 5 upper teeth that she would like removed.  Return in 4 weeks for follow-up.   ORDERS PLACED FOR THIS ENCOUNTER: No orders of the defined types were placed in this encounter.   MEDICATIONS PRESCRIBED THIS ENCOUNTER: No orders of the defined types were placed in this encounter.   THERAPY PLAN:  Continue treatment as planned.  All questions were answered. The patient knows to call the clinic with any problems, questions or concerns. We can certainly see the patient much sooner if necessary.  Patient and plan discussed with Dr. Ancil Linsey and she is in agreement with the aforementioned.   This note is electronically signed by: Doy Mince 01/09/2016 9:51 AM

## 2016-01-09 NOTE — Patient Instructions (Signed)
[name] at [facility] Discharge Instructions  RECOMMENDATIONS MADE BY THE CONSULTANT AND ANY TEST RESULTS WILL BE SENT TO YOUR REFERRING PHYSICIAN.  You were seen by [name] today. Return in 4 weeks for follow up. You may have dental extraction but please call the Center when you have an appointment and we will schedule labs for 1-2 days before procedure.  Thank you for choosing [place] at [facility] to provide your oncology and hematology care.  To afford each patient quality time with our provider, please arrive at least 15 minutes before your scheduled appointment time.   Beginning January 23rd 2017 lab work for the [name] will be done in the  Main lab at [facility] on 1st floor. If you have a lab appointment with the [facility] please come in thru the  Main Entrance and check in at the main information desk  You need to re-schedule your appointment should you arrive 10 or more minutes late.  We strive to give you quality time with our providers, and arriving late affects you and other patients whose appointments are after yours.  Also, if you no show three or more times for appointments you may be dismissed from the clinic at the providers discretion.     Again, thank you for choosing [facility].  Our hope is that these requests will decrease the amount of time that you wait before being seen by our physicians.       _____________________________________________________________  Should you have questions after your visit to [facility], please contact our office at [phone] between the hours of 8:30 a.m. and 4:30 p.m.  Voicemails left after 4:30 p.m. will not be returned until the following business day.  For prescription refill requests, have your pharmacy contact our office.         Resources For Cancer Patients and their Caregivers ? American Cancer Society: Can assist with transportation,  wigs, general needs, runs Look Good Feel Better.        253-884-8292 ? Cancer Care: Provides financial assistance, online support groups, medication/co-pay assistance.  1-800-813-HOPE 252-297-6098) ? [facility] Assists [place] cancer patients and their families through emotional , educational and financial support.  506-653-8685 ? [place] DSS Where to apply for food stamps, Medicaid and utility assistance. (985)275-2097 ? [facility]: Transportation to medical appointments. 3314456966 ? Social Security Administration: May apply for disability if have a Stage IV cancer. 304 634 6072 640-632-0272 ? [employer], Disability and Transit Services: Assists with nutrition, care and transit needs. [place] Support Programs: @10RELATIVEDAYS @ > Cancer Support Group  2nd Tuesday of the month 1pm-2pm, Journey Room  > Creative Journey  3rd Tuesday of the month 1130am-1pm, Journey Room  > Look Good Feel Better  1st Wednesday of the month 10am-12 noon, Journey Room (Call [place] to register [phone])

## 2016-01-09 NOTE — Assessment & Plan Note (Addendum)
Stage IV Pancreatic Cancer, on systemic therapy with Abraxane and Gemzar in a day 1, 8 fashion every 28 days.  Continued response to therapy.  Oncology history is updated.   Patient has entered into a pain contract with her primary care provider for non-oncologic pain.  She has been followed by Dr. Luna Glasgow (Ortho) for her knee and ankle pain.    Pre-chemo labs as ordered: CBC diff, CMET, CA 19-9.   I personally reviewed and went over laboratory results with the patient.  The results are noted within this dictation.    DNR  She has not had chest imaging in > 12 months.  Order for CT of chest is ordered.  This is scheduled for 01/22/2016.  She would like to have some teeth extracted.  She has about 5 upper teeth that she would like removed.  Return in 4 weeks for follow-up.

## 2016-01-09 NOTE — Progress Notes (Signed)
Tolerated tx w/o adverse reaction.  Alert, in no distress.  VSS.  Discharged ambulatory. 

## 2016-01-14 ENCOUNTER — Encounter: Payer: Self-pay | Admitting: Family Medicine

## 2016-01-14 NOTE — Assessment & Plan Note (Signed)
Increased flare of right knee and leg pain, short sharp course of high dose prednisone, then resume 5 mg daily dose along with celebrex daily

## 2016-01-14 NOTE — Assessment & Plan Note (Signed)
Uncontrolled with RLE pain, short burst of prednisone , then daily celebrex and pred 5 mg

## 2016-01-14 NOTE — Progress Notes (Signed)
   [name]     MRN: [id number]      DOB: Apr 18, 1929   HPI [name] is here with her sister with a 2 week h/o increased and uncontrolled RLE and right knee pain. States , despite celebrex, no real relief No trigger known   ROS Denies recent fever or chills. Denies sinus pressure, nasal congestion, ear pain or sore throat. Denies chest congestion, productive cough or wheezing. Denies chest pains, palpitations and leg swelling Denies abdominal pain, nausea, vomiting,diarrhea or constipation.   Denies skin break down or rash.   PE  BP 122/78   Pulse 92   Resp 16   Ht 5' 3.5" (1.613 m)   Wt 138 lb (62.6 kg)   SpO2 98%   BMI 24.06 kg/m   Patient alert and oriented and in no cardiopulmonary distress.  HEENT: No facial asymmetry, EOMI,   oropharynx pink and moist.  Neck supple no JVD, no mass.  Chest: Clear to auscultation bilaterally.  CVS: S1, S2 no murmurs, no S3.Regular rate.  ABD: Soft non tender.   Ext: No edema  MS: decreased  ROM spine, shoulders, hips and knees.  Skin: Intact, no ulcerations or rash noted.  Psych: Good eye contact, normal affect. Memory intact not anxious or depressed appearing.  CNS: CN 2-12 intact  Assessment & Plan  Right knee pain Increased flare of right knee and leg pain, short sharp course of high dose prednisone, then resume 5 mg daily dose along with celebrex daily  Essential hypertension Controlled, no change in medication   Back pain with radiation Uncontrolled with RLE pain, short burst of prednisone , then daily celebrex and pred 5 mg

## 2016-01-14 NOTE — Assessment & Plan Note (Signed)
Controlled, no change in medication  

## 2016-01-15 ENCOUNTER — Other Ambulatory Visit (HOSPITAL_COMMUNITY): Payer: Self-pay | Admitting: Hematology & Oncology

## 2016-01-15 DIAGNOSIS — C259 Malignant neoplasm of pancreas, unspecified: Secondary | ICD-10-CM

## 2016-01-15 DIAGNOSIS — C787 Secondary malignant neoplasm of liver and intrahepatic bile duct: Principal | ICD-10-CM

## 2016-01-15 DIAGNOSIS — R197 Diarrhea, unspecified: Secondary | ICD-10-CM

## 2016-01-15 DIAGNOSIS — R634 Abnormal weight loss: Secondary | ICD-10-CM

## 2016-01-16 ENCOUNTER — Other Ambulatory Visit: Payer: Self-pay

## 2016-01-16 MED ORDER — HYDROCODONE-ACETAMINOPHEN 5-325 MG PO TABS: ORAL_TABLET | ORAL | 0 refills | Status: DC

## 2016-01-17 ENCOUNTER — Telehealth (HOSPITAL_COMMUNITY): Payer: Self-pay | Admitting: *Deleted

## 2016-01-17 NOTE — Telephone Encounter (Signed)
She should come in today or tomorrow for CBC diff

## 2016-01-18 ENCOUNTER — Encounter (HOSPITAL_COMMUNITY): Payer: Medicare Other | Attending: Hematology and Oncology

## 2016-01-18 DIAGNOSIS — C252 Malignant neoplasm of tail of pancreas: Secondary | ICD-10-CM | POA: Diagnosis not present

## 2016-01-18 DIAGNOSIS — C787 Secondary malignant neoplasm of liver and intrahepatic bile duct: Secondary | ICD-10-CM | POA: Diagnosis not present

## 2016-01-18 DIAGNOSIS — C801 Malignant (primary) neoplasm, unspecified: Secondary | ICD-10-CM | POA: Insufficient documentation

## 2016-01-18 DIAGNOSIS — C259 Malignant neoplasm of pancreas, unspecified: Secondary | ICD-10-CM | POA: Diagnosis not present

## 2016-01-18 LAB — CBC WITH DIFFERENTIAL/PLATELET
BASOS ABS: 0 10*3/uL (ref 0.0–0.1)
Basophils Relative: 0 %
EOS ABS: 0 10*3/uL (ref 0.0–0.7)
EOS PCT: 0 %
HCT: 32.6 % — ABNORMAL LOW (ref 36.0–46.0)
Hemoglobin: 10.8 g/dL — ABNORMAL LOW (ref 12.0–15.0)
Lymphocytes Relative: 9 %
Lymphs Abs: 0.7 10*3/uL (ref 0.7–4.0)
MCH: 28.9 pg (ref 26.0–34.0)
MCHC: 33.1 g/dL (ref 30.0–36.0)
MCV: 87.2 fL (ref 78.0–100.0)
Monocytes Absolute: 0.1 10*3/uL (ref 0.1–1.0)
Monocytes Relative: 1 %
Neutro Abs: 7 10*3/uL (ref 1.7–7.7)
Neutrophils Relative %: 90 %
PLATELETS: 63 10*3/uL — AB (ref 150–400)
RBC: 3.74 MIL/uL — AB (ref 3.87–5.11)
RDW: 18.6 % — ABNORMAL HIGH (ref 11.5–15.5)
WBC: 7.8 10*3/uL (ref 4.0–10.5)

## 2016-01-18 LAB — COMPREHENSIVE METABOLIC PANEL
ALT: 19 U/L (ref 14–54)
AST: 16 U/L (ref 15–41)
Albumin: 3.9 g/dL (ref 3.5–5.0)
Alkaline Phosphatase: 63 U/L (ref 38–126)
Anion gap: 6 (ref 5–15)
BUN: 30 mg/dL — AB (ref 6–20)
CHLORIDE: 114 mmol/L — AB (ref 101–111)
CO2: 20 mmol/L — AB (ref 22–32)
CREATININE: 1.08 mg/dL — AB (ref 0.44–1.00)
Calcium: 8.7 mg/dL — ABNORMAL LOW (ref 8.9–10.3)
GFR, EST AFRICAN AMERICAN: 53 mL/min — AB (ref >60)
GFR, EST NON AFRICAN AMERICAN: 45 mL/min — AB (ref >60)
Glucose, Bld: 115 mg/dL — ABNORMAL HIGH (ref 65–99)
POTASSIUM: 4.3 mmol/L (ref 3.5–5.1)
SODIUM: 140 mmol/L (ref 135–145)
TOTAL PROTEIN: 6.6 g/dL (ref 6.5–8.1)
Total Bilirubin: 1 mg/dL (ref 0.3–1.2)

## 2016-01-22 ENCOUNTER — Ambulatory Visit (HOSPITAL_COMMUNITY): Payer: Medicare Other

## 2016-01-23 ENCOUNTER — Encounter (HOSPITAL_COMMUNITY): Payer: Medicare Other

## 2016-01-23 ENCOUNTER — Ambulatory Visit (HOSPITAL_COMMUNITY)
Admission: RE | Admit: 2016-01-23 | Discharge: 2016-01-23 | Disposition: A | Discharge status: Home / Self Care | Payer: Medicare Other | Source: Ambulatory Visit | Attending: Hematology & Oncology | Admitting: Hematology & Oncology

## 2016-01-23 DIAGNOSIS — I2510 Atherosclerotic heart disease of native coronary artery without angina pectoris: Secondary | ICD-10-CM | POA: Diagnosis not present

## 2016-01-23 DIAGNOSIS — C787 Secondary malignant neoplasm of liver and intrahepatic bile duct: Principal | ICD-10-CM

## 2016-01-23 DIAGNOSIS — C259 Malignant neoplasm of pancreas, unspecified: Secondary | ICD-10-CM | POA: Diagnosis not present

## 2016-01-23 DIAGNOSIS — I700 Atherosclerosis of aorta: Secondary | ICD-10-CM | POA: Diagnosis not present

## 2016-01-23 DIAGNOSIS — E079 Disorder of thyroid, unspecified: Secondary | ICD-10-CM | POA: Diagnosis not present

## 2016-01-23 DIAGNOSIS — R634 Abnormal weight loss: Secondary | ICD-10-CM | POA: Diagnosis not present

## 2016-01-23 DIAGNOSIS — C801 Malignant (primary) neoplasm, unspecified: Secondary | ICD-10-CM | POA: Diagnosis not present

## 2016-01-23 DIAGNOSIS — C252 Malignant neoplasm of tail of pancreas: Secondary | ICD-10-CM | POA: Diagnosis not present

## 2016-01-23 LAB — COMPREHENSIVE METABOLIC PANEL
ALT: 14 U/L (ref 14–54)
AST: 14 U/L — AB (ref 15–41)
Albumin: 3.9 g/dL (ref 3.5–5.0)
Alkaline Phosphatase: 66 U/L (ref 38–126)
Anion gap: 7 (ref 5–15)
BILIRUBIN TOTAL: 1.1 mg/dL (ref 0.3–1.2)
BUN: 16 mg/dL (ref 6–20)
CO2: 25 mmol/L (ref 22–32)
CREATININE: 1.13 mg/dL — AB (ref 0.44–1.00)
Calcium: 8.9 mg/dL (ref 8.9–10.3)
Chloride: 108 mmol/L (ref 101–111)
GFR calc Af Amer: 50 mL/min — ABNORMAL LOW (ref >60)
GFR, EST NON AFRICAN AMERICAN: 43 mL/min — AB (ref >60)
Glucose, Bld: 102 mg/dL — ABNORMAL HIGH (ref 65–99)
Potassium: 3.4 mmol/L — ABNORMAL LOW (ref 3.5–5.1)
Sodium: 140 mmol/L (ref 135–145)
TOTAL PROTEIN: 6.4 g/dL — AB (ref 6.5–8.1)

## 2016-01-23 LAB — CBC WITH DIFFERENTIAL/PLATELET
BASOS ABS: 0 10*3/uL (ref 0.0–0.1)
Basophils Relative: 0 %
EOS ABS: 0.1 10*3/uL (ref 0.0–0.7)
EOS PCT: 1 %
HCT: 33.3 % — ABNORMAL LOW (ref 36.0–46.0)
Hemoglobin: 11.1 g/dL — ABNORMAL LOW (ref 12.0–15.0)
LYMPHS ABS: 1.6 10*3/uL (ref 0.7–4.0)
Lymphocytes Relative: 24 %
MCH: 29.2 pg (ref 26.0–34.0)
MCHC: 33.3 g/dL (ref 30.0–36.0)
MCV: 87.6 fL (ref 78.0–100.0)
MONO ABS: 0.7 10*3/uL (ref 0.1–1.0)
Monocytes Relative: 11 %
Neutro Abs: 4.1 10*3/uL (ref 1.7–7.7)
Neutrophils Relative %: 64 %
PLATELETS: 127 10*3/uL — AB (ref 150–400)
RBC: 3.8 MIL/uL — AB (ref 3.87–5.11)
RDW: 18.9 % — AB (ref 11.5–15.5)
WBC: 6.6 10*3/uL (ref 4.0–10.5)

## 2016-01-23 NOTE — Progress Notes (Signed)
Pt here today for Aranesp injection. Pt did not meet treatment parameters. Aranesp held today due to Hgb 11.1. Pt aware and will return in 3 weeks for next labs and injection.

## 2016-01-23 NOTE — Patient Instructions (Signed)
[place] at [facility] Discharge Instructions  RECOMMENDATIONS MADE BY THE CONSULTANT AND ANY TEST RESULTS WILL BE SENT TO YOUR REFERRING PHYSICIAN. No injection was given today. Pt to return as scheduled.  Thank you for choosing [place] at [facility] to provide your oncology and hematology care.  To afford each patient quality time with our provider, please arrive at least 15 minutes before your scheduled appointment time.   Beginning January 23rd 2017 lab work for the [name] will be done in the  Main lab at [facility] on 1st floor. If you have a lab appointment with the [facility] please come in thru the  Main Entrance and check in at the main information desk  You need to re-schedule your appointment should you arrive 10 or more minutes late.  We strive to give you quality time with our providers, and arriving late affects you and other patients whose appointments are after yours.  Also, if you no show three or more times for appointments you may be dismissed from the clinic at the providers discretion.     Again, thank you for choosing [facility].  Our hope is that these requests will decrease the amount of time that you wait before being seen by our physicians.       _____________________________________________________________  Should you have questions after your visit to [facility], please contact our office at [phone] between the hours of 8:30 a.m. and 4:30 p.m.  Voicemails left after 4:30 p.m. will not be returned until the following business day.  For prescription refill requests, have your pharmacy contact our office.         Resources For Cancer Patients and their Caregivers ? American Cancer Society: Can assist with transportation, wigs, general needs, runs Look Good Feel Better.        ([phone] [phone] ? Cancer Care: Provides financial assistance, online support groups,  medication/co-pay assistance.  1-800-813-HOPE 754-235-0876) ? [place] Assists [place] cancer patients and their families through emotional , educational and financial support.  8147846401 ? [place] DSS Where to apply for food stamps, Medicaid and utility assistance. [phone] ? RCATS: Transportation to medical appointments. (563)076-9327 ? Social Security Administration: May apply for disability if have a Stage IV cancer. (904)230-0655 (574)070-1692 ? [employer], Disability and Transit Services: Assists with nutrition, care and transit needs. [place] Support Programs: @10RELATIVEDAYS @ > Cancer Support Group  2nd Tuesday of the month 1pm-2pm, Journey Room  > Creative Journey  3rd Tuesday of the month 1130am-1pm, Journey Room  > Look Good Feel Better  1st Wednesday of the month 10am-12 noon, Journey Room (Call [place] to register [phone])

## 2016-01-25 ENCOUNTER — Other Ambulatory Visit (HOSPITAL_COMMUNITY): Payer: Self-pay

## 2016-01-25 DIAGNOSIS — C259 Malignant neoplasm of pancreas, unspecified: Secondary | ICD-10-CM

## 2016-01-25 DIAGNOSIS — C787 Secondary malignant neoplasm of liver and intrahepatic bile duct: Principal | ICD-10-CM

## 2016-01-30 ENCOUNTER — Encounter (HOSPITAL_BASED_OUTPATIENT_CLINIC_OR_DEPARTMENT_OTHER): Payer: Medicare Other

## 2016-01-30 VITALS — BP 154/36 | HR 73 | Temp 98.0°F | Resp 18 | Wt 138.8 lb

## 2016-01-30 DIAGNOSIS — C787 Secondary malignant neoplasm of liver and intrahepatic bile duct: Secondary | ICD-10-CM | POA: Diagnosis not present

## 2016-01-30 DIAGNOSIS — Z5111 Encounter for antineoplastic chemotherapy: Secondary | ICD-10-CM

## 2016-01-30 DIAGNOSIS — C259 Malignant neoplasm of pancreas, unspecified: Secondary | ICD-10-CM | POA: Diagnosis not present

## 2016-01-30 DIAGNOSIS — C252 Malignant neoplasm of tail of pancreas: Secondary | ICD-10-CM | POA: Diagnosis not present

## 2016-01-30 DIAGNOSIS — C801 Malignant (primary) neoplasm, unspecified: Secondary | ICD-10-CM | POA: Diagnosis not present

## 2016-01-30 LAB — COMPREHENSIVE METABOLIC PANEL
ALT: 15 U/L (ref 14–54)
AST: 16 U/L (ref 15–41)
Albumin: 3.7 g/dL (ref 3.5–5.0)
Alkaline Phosphatase: 60 U/L (ref 38–126)
BILIRUBIN TOTAL: 0.7 mg/dL (ref 0.3–1.2)
BUN: 21 mg/dL — ABNORMAL HIGH (ref 6–20)
CHLORIDE: 111 mmol/L (ref 101–111)
CO2: 22 mmol/L (ref 22–32)
Calcium: 8.3 mg/dL — ABNORMAL LOW (ref 8.9–10.3)
Creatinine, Ser: 1.06 mg/dL — ABNORMAL HIGH (ref 0.44–1.00)
GFR calc Af Amer: 54 mL/min — ABNORMAL LOW (ref >60)
GFR, EST NON AFRICAN AMERICAN: 47 mL/min — AB (ref >60)
Glucose, Bld: 132 mg/dL — ABNORMAL HIGH (ref 65–99)
POTASSIUM: 4 mmol/L (ref 3.5–5.1)
Sodium: 135 mmol/L (ref 135–145)
Total Protein: 6.6 g/dL (ref 6.5–8.1)

## 2016-01-30 LAB — CBC WITH DIFFERENTIAL/PLATELET
Basophils Absolute: 0 10*3/uL (ref 0.0–0.1)
Basophils Relative: 0 %
Eosinophils Absolute: 0 10*3/uL (ref 0.0–0.7)
Eosinophils Relative: 0 %
HCT: 32.5 % — ABNORMAL LOW (ref 36.0–46.0)
Hemoglobin: 10.8 g/dL — ABNORMAL LOW (ref 12.0–15.0)
Lymphocytes Relative: 11 %
Lymphs Abs: 0.8 10*3/uL (ref 0.7–4.0)
MCH: 29.3 pg (ref 26.0–34.0)
MCHC: 33.2 g/dL (ref 30.0–36.0)
MCV: 88.3 fL (ref 78.0–100.0)
Monocytes Absolute: 0.5 10*3/uL (ref 0.1–1.0)
Monocytes Relative: 7 %
Neutro Abs: 5.3 10*3/uL (ref 1.7–7.7)
Neutrophils Relative %: 82 %
Platelets: 227 10*3/uL (ref 150–400)
RBC: 3.68 MIL/uL — ABNORMAL LOW (ref 3.87–5.11)
RDW: 18.4 % — ABNORMAL HIGH (ref 11.5–15.5)
WBC: 6.6 10*3/uL (ref 4.0–10.5)

## 2016-01-30 MED ORDER — SODIUM CHLORIDE 0.9 % IV SOLN
1000.0000 mg/m2 | Freq: Once | INTRAVENOUS | Status: AC
  Administered 2016-01-30: 1786 mg via INTRAVENOUS
  Filled 2016-01-30: qty 46.97

## 2016-01-30 MED ORDER — HEPARIN SOD (PORK) LOCK FLUSH 100 UNIT/ML IV SOLN
500.0000 [IU] | Freq: Once | INTRAVENOUS | Status: AC | PRN
  Administered 2016-01-30: 500 [IU]
  Filled 2016-01-30: qty 5

## 2016-01-30 MED ORDER — SODIUM CHLORIDE 0.9 % IV SOLN
Freq: Once | INTRAVENOUS | Status: AC
  Administered 2016-01-30: 8 mg via INTRAVENOUS
  Filled 2016-01-30: qty 4

## 2016-01-30 MED ORDER — PACLITAXEL PROTEIN-BOUND CHEMO INJECTION 100 MG
125.0000 mg/m2 | Freq: Once | INTRAVENOUS | Status: AC
  Administered 2016-01-30: 225 mg via INTRAVENOUS
  Filled 2016-01-30: qty 45

## 2016-01-30 MED ORDER — INFLUENZA VAC SPLIT QUAD 0.5 ML IM SUSY
0.5000 mL | PREFILLED_SYRINGE | Freq: Once | INTRAMUSCULAR | Status: AC
  Administered 2016-01-30: 0.5 mL via INTRAMUSCULAR
  Filled 2016-01-30: qty 0.5

## 2016-01-30 MED ORDER — SODIUM CHLORIDE 0.9 % IJ SOLN: 10.0000 mL | INTRAMUSCULAR | Status: DC | PRN

## 2016-01-30 MED ORDER — SODIUM CHLORIDE 0.9 % IV SOLN
Freq: Once | INTRAVENOUS | Status: AC
  Administered 2016-01-30: 12:00:00 via INTRAVENOUS

## 2016-01-30 NOTE — Patient Instructions (Signed)
[facility] Discharge Instructions for Patients Receiving Chemotherapy   Beginning January 23rd 2017 lab work for the Cancer Center will be done in the  Main lab at [facility] on 1st floor. If you have a lab appointment with the Cancer Center please come in thru the  Main Entrance and check in at the main information desk   Today you received the following chemotherapy agents:  Abraxane and Gemzar.  If you develop nausea and vomiting, or diarrhea that is not controlled by your medication, call the clinic.  The clinic phone number is [phone]. Office hours are Monday-Friday 8:30am-5:00pm.  BELOW ARE SYMPTOMS THAT SHOULD BE REPORTED IMMEDIATELY:  *FEVER GREATER THAN 101.0 F  *CHILLS WITH OR WITHOUT FEVER  NAUSEA AND VOMITING THAT IS NOT CONTROLLED WITH YOUR NAUSEA MEDICATION  *UNUSUAL SHORTNESS OF BREATH  *UNUSUAL BRUISING OR BLEEDING  TENDERNESS IN MOUTH AND THROAT WITH OR WITHOUT PRESENCE OF ULCERS  *URINARY PROBLEMS  *BOWEL PROBLEMS  UNUSUAL RASH Items with * indicate a potential emergency and should be followed up as soon as possible. If you have an emergency after office hours please contact your primary care physician or go to the nearest emergency department.  Please call the clinic during office hours if you have any questions or concerns.   You may also contact the Patient Navigator at [phone] should you have any questions or need assistance in obtaining follow up care.      Resources For Cancer Patients and their Caregivers ? American Cancer Society: Can assist with transportation, wigs, general needs, runs Look Good Feel Better.        1-888-227-6333 ? Cancer Care: Provides financial assistance, online support groups, medication/co-pay assistance.  1-800-813-HOPE (4673) ? [facility] Assists [place] cancer patients and their families through emotional , educational and financial support.   336-427-4357 ? [place] DSS Where to apply for food stamps, Medicaid and utility assistance. 336-342-1394 ? RCATS: Transportation to medical appointments. 336-347-2287 ? Social Security Administration: May apply for disability if have a Stage IV cancer. 336-342-7796 1-800-772-1213 ? [place] Aging, Disability and Transit Services: Assists with nutrition, care and transit needs. [phone]         

## 2016-01-30 NOTE — Progress Notes (Signed)
Patient tolerated infusion well.  VSS.  Patient ambulatory and stable upon discharge from clinic.   

## 2016-02-05 ENCOUNTER — Other Ambulatory Visit (HOSPITAL_COMMUNITY): Payer: Self-pay | Admitting: Hematology & Oncology

## 2016-02-05 DIAGNOSIS — C259 Malignant neoplasm of pancreas, unspecified: Secondary | ICD-10-CM

## 2016-02-05 DIAGNOSIS — C787 Secondary malignant neoplasm of liver and intrahepatic bile duct: Principal | ICD-10-CM

## 2016-02-05 DIAGNOSIS — R634 Abnormal weight loss: Secondary | ICD-10-CM

## 2016-02-05 DIAGNOSIS — R197 Diarrhea, unspecified: Secondary | ICD-10-CM

## 2016-02-06 ENCOUNTER — Encounter (HOSPITAL_COMMUNITY): Payer: Medicare Other

## 2016-02-06 ENCOUNTER — Encounter (HOSPITAL_COMMUNITY): Payer: Medicare Other | Attending: Oncology | Admitting: Oncology

## 2016-02-06 ENCOUNTER — Encounter (HOSPITAL_COMMUNITY): Payer: Medicare Other | Attending: Hematology & Oncology

## 2016-02-06 ENCOUNTER — Encounter (HOSPITAL_COMMUNITY): Payer: Self-pay | Admitting: Oncology

## 2016-02-06 ENCOUNTER — Other Ambulatory Visit (HOSPITAL_COMMUNITY): Payer: Medicare Other

## 2016-02-06 VITALS — BP 145/62 | HR 70 | Temp 98.3°F | Resp 18 | Wt 137.2 lb

## 2016-02-06 DIAGNOSIS — C259 Malignant neoplasm of pancreas, unspecified: Secondary | ICD-10-CM

## 2016-02-06 DIAGNOSIS — C787 Secondary malignant neoplasm of liver and intrahepatic bile duct: Secondary | ICD-10-CM

## 2016-02-06 DIAGNOSIS — Z5111 Encounter for antineoplastic chemotherapy: Secondary | ICD-10-CM | POA: Diagnosis present

## 2016-02-06 LAB — COMPREHENSIVE METABOLIC PANEL
ALBUMIN: 3.8 g/dL (ref 3.5–5.0)
ALT: 17 U/L (ref 14–54)
AST: 16 U/L (ref 15–41)
Alkaline Phosphatase: 58 U/L (ref 38–126)
Anion gap: 8 (ref 5–15)
BUN: 28 mg/dL — AB (ref 6–20)
CHLORIDE: 108 mmol/L (ref 101–111)
CO2: 23 mmol/L (ref 22–32)
CREATININE: 1.2 mg/dL — AB (ref 0.44–1.00)
Calcium: 8.6 mg/dL — ABNORMAL LOW (ref 8.9–10.3)
GFR calc Af Amer: 46 mL/min — ABNORMAL LOW (ref >60)
GFR calc non Af Amer: 40 mL/min — ABNORMAL LOW (ref >60)
GLUCOSE: 137 mg/dL — AB (ref 65–99)
POTASSIUM: 4.1 mmol/L (ref 3.5–5.1)
SODIUM: 139 mmol/L (ref 135–145)
Total Bilirubin: 0.7 mg/dL (ref 0.3–1.2)
Total Protein: 6.7 g/dL (ref 6.5–8.1)

## 2016-02-06 LAB — CBC WITH DIFFERENTIAL/PLATELET
Basophils Absolute: 0 10*3/uL (ref 0.0–0.1)
Basophils Relative: 0 %
EOS ABS: 0 10*3/uL (ref 0.0–0.7)
EOS PCT: 0 %
HCT: 31.1 % — ABNORMAL LOW (ref 36.0–46.0)
Hemoglobin: 10.2 g/dL — ABNORMAL LOW (ref 12.0–15.0)
LYMPHS ABS: 0.9 10*3/uL (ref 0.7–4.0)
LYMPHS PCT: 22 %
MCH: 28.8 pg (ref 26.0–34.0)
MCHC: 32.8 g/dL (ref 30.0–36.0)
MCV: 87.9 fL (ref 78.0–100.0)
MONOS PCT: 1 %
Monocytes Absolute: 0 10*3/uL — ABNORMAL LOW (ref 0.1–1.0)
Neutro Abs: 3.3 10*3/uL (ref 1.7–7.7)
Neutrophils Relative %: 77 %
PLATELETS: 99 10*3/uL — AB (ref 150–400)
RBC: 3.54 MIL/uL — AB (ref 3.87–5.11)
RDW: 17.6 % — ABNORMAL HIGH (ref 11.5–15.5)
WBC: 4.3 10*3/uL (ref 4.0–10.5)

## 2016-02-06 LAB — T4, FREE: Free T4: 1.03 ng/dL (ref 0.61–1.12)

## 2016-02-06 LAB — TSH: TSH: 0.433 u[IU]/mL (ref 0.350–4.500)

## 2016-02-06 MED ORDER — PACLITAXEL PROTEIN-BOUND CHEMO INJECTION 100 MG
125.0000 mg/m2 | Freq: Once | INTRAVENOUS | Status: AC
  Administered 2016-02-06: 225 mg via INTRAVENOUS
  Filled 2016-02-06: qty 45

## 2016-02-06 MED ORDER — SODIUM CHLORIDE 0.9 % IJ SOLN: 10.0000 mL | INTRAMUSCULAR | Status: DC | PRN

## 2016-02-06 MED ORDER — SODIUM CHLORIDE 0.9 % IV SOLN
Freq: Once | INTRAVENOUS | Status: AC
  Administered 2016-02-06: 8 mg via INTRAVENOUS
  Filled 2016-02-06: qty 4

## 2016-02-06 MED ORDER — SODIUM CHLORIDE 0.9 % IV SOLN
Freq: Once | INTRAVENOUS | Status: AC
  Administered 2016-02-06: 12:00:00 via INTRAVENOUS

## 2016-02-06 MED ORDER — SODIUM CHLORIDE 0.9 % IV SOLN
1000.0000 mg/m2 | Freq: Once | INTRAVENOUS | Status: AC
  Administered 2016-02-06: 1786 mg via INTRAVENOUS
  Filled 2016-02-06: qty 46.97

## 2016-02-06 MED ORDER — HEPARIN SOD (PORK) LOCK FLUSH 100 UNIT/ML IV SOLN
500.0000 [IU] | Freq: Once | INTRAVENOUS | Status: AC | PRN
  Administered 2016-02-06: 500 [IU]

## 2016-02-06 NOTE — Progress Notes (Signed)
Dr. Whitney Muse aware of lab results - okay to tx per MD.  Tolerated tx w/o adverse reaction.  Alert, in no distress.  VSS.  Discharged ambulatory.

## 2016-02-06 NOTE — Patient Instructions (Signed)
[place] at [facility] Discharge Instructions  RECOMMENDATIONS MADE BY THE CONSULTANT AND ANY TEST RESULTS WILL BE SENT TO YOUR REFERRING PHYSICIAN.  You were seen today by Kirby Crigler PA-C.  Return in 2 weeks for labs and treatment. Follow up with provider in 3-4 weeks.   Thank you for choosing [place] at [facility] to provide your oncology and hematology care.  To afford each patient quality time with our provider, please arrive at least 15 minutes before your scheduled appointment time.   Beginning January 23rd 2017 lab work for the [name] will be done in the  Main lab at [facility] on 1st floor. If you have a lab appointment with the [facility] please come in thru the  Main Entrance and check in at the main information desk  You need to re-schedule your appointment should you arrive 10 or more minutes late.  We strive to give you quality time with our providers, and arriving late affects you and other patients whose appointments are after yours.  Also, if you no show three or more times for appointments you may be dismissed from the clinic at the providers discretion.     Again, thank you for choosing [facility].  Our hope is that these requests will decrease the amount of time that you wait before being seen by our physicians.       _____________________________________________________________  Should you have questions after your visit to [facility], please contact our office at [phone] between the hours of 8:30 a.m. and 4:30 p.m.  Voicemails left after 4:30 p.m. will not be returned until the following business day.  For prescription refill requests, have your pharmacy contact our office.         Resources For Cancer Patients and their Caregivers ? American Cancer Society: Can assist with transportation, wigs, general needs, runs Look Good Feel Better.         316-052-1311 ? Cancer Care: Provides financial assistance, online support groups, medication/co-pay assistance.  1-800-813-HOPE 4323256034) ? [place] Assists [place] cancer patients and their families through emotional , educational and financial support.  (872)751-8581 ? [place] DSS Where to apply for food stamps, Medicaid and utility assistance. 731-465-5965 ? RCATS: Transportation to medical appointments. 272-044-2022 ? Social Security Administration: May apply for disability if have a Stage IV cancer. [phone] ? [employer], Disability and Transit Services: Assists with nutrition, care and transit needs. [place] Support Programs: @10RELATIVEDAYS @ > Cancer Support Group  2nd Tuesday of the month 1pm-2pm, Journey Room  > Creative Journey  3rd Tuesday of the month 1130am-1pm, Journey Room  > Look Good Feel Better  1st Wednesday of the month 10am-12 noon, Journey Room (Call [place] to register [phone])

## 2016-02-06 NOTE — Assessment & Plan Note (Addendum)
Stage IV Pancreatic Cancer, on systemic therapy with Abraxane and Gemzar in a day 1, 8 fashion every 28 days.  Continued response to therapy.  Oncology history is updated.   Patient has entered into a pain contract with her primary care provider for non-oncologic pain.  She has been followed by Dr. Luna Glasgow (Ortho) for her knee and ankle pain.    Pre-chemo labs as ordered: CBC diff, CMET, CA 19-9.   I personally reviewed and went over laboratory results with the patient.  The results are noted within this dictation.    DNR  She had some upper teeth extracted.  She will be fitted for an upper denture in ~ 2 months.  I personally reviewed and went over radiographic studies with the patient.  The results are noted within this dictation.    Return in 3-4 weeks for follow-up.

## 2016-02-06 NOTE — Progress Notes (Signed)
[name] Nakayama, MD [address] [place] [address]  Pancreatic cancer metastasized to liver [facility])  CURRENT THERAPY: Palliative Gemcitabine/Abraxane  INTERVAL HISTORY: [name] 82 y.o. female returns for followup of Stage IV Pancreatic Cancer, on systemic therapy with Abraxane and Gemzar in a day 1, 8 fashion every 28 days.  Continued response to therapy.     Pancreatic cancer metastasized to liver ([place])   03/10/2014 Initial Diagnosis    Pancreatic cancer metastasized to liver      03/22/2014 -  Chemotherapy    Abraxane/Gemzar days 1, 8, every 28 days.  Day 15 was cancelled due to leukopenia and thrombocytopenia on day 15 cycle 1.      05/24/2014 Treatment Plan Change    Day 8 of cycle 3 is held with ANC of 1.1      06/05/2014 Imaging    CT C/A/P . Interval decrease in size of the pancreatic tail mass.Improved hepatic metastatic disease. No new lesions. No CT findings for metastatic disease involving the chest.      08/09/2014 Tumor Marker    CA 19-9= 33 (WNL)      10/26/2014 Imaging    MRI- Continued interval decrease in size of the hepatic metastatic lesions and no new lesions are identified. Continued decrease in size of the pancreatic tail lesion.      01/03/2015 Tumor Marker    Results for RITCHIE, JANSKY (MRN [id number]) as of 01/18/2015 12:52  01/03/2015 10:00 CA 19-9: 14       01/17/2015 Imaging    MRI- Response to therapy of hepatic metastasis.  Similar size of a pancreatic tail lesion.      03/20/2015 Imaging    MRI-L spine- Severe disc space narrowing at L2-L3, with endplate reactive changes. Large disc extrusion into the ventral epidural space,central to the RIGHT with a cephalad migrated free fragment. Additional Large disc extrusion into the retroperitone...      03/22/2015 Imaging    CT pelvis- No evidence for metastatic disease within the pelvis.      07/24/2015 Imaging    MRI abd- Continued response to therapy, with no  residual detectable liver metastases. No new sites of metastatic disease in the abdomen.       08/15/2015 Code Status     She confirms desire for DNR status.      12/21/2015 Imaging    MRI liver- Severe image degradation due to motion artifact reducing diagnostic sensitivity and specificity. Reduce conspicuity of the pancreatic tail lesion suggesting further improvement. The original liver lesions have resolved.       She is doing well.  She is tolerating treatment well.  She denies any recurrence of diarrhea or of incontinence.  She is S/P upper teeth extraction.  Weight is stable.  She will be fitted for dentures in ~ 2 mo.  Review of Systems  Constitutional: Negative.  Negative for chills, fever and weight loss.  HENT: Negative.   Eyes: Negative.   Respiratory: Negative.   Cardiovascular: Negative.   Gastrointestinal: Negative.  Negative for abdominal pain, constipation and diarrhea.  Genitourinary: Negative.   Musculoskeletal: Negative.   Skin: Negative.   Neurological: Negative.  Negative for weakness.  Endo/Heme/Allergies: Negative.   Psychiatric/Behavioral: Negative.     Past Medical History:  Diagnosis Date  . Anemia due to antineoplastic chemotherapy 09/12/2015   Started Aranesp 500 mcg on 09/12/2015  . Anxiety   . Chronic diarrhea   . Depression   .  DNR (do not resuscitate) 08/16/2015  . Erosive esophagitis   . GERD (gastroesophageal reflux disease)   . Hx of adenomatous colonic polyps    tubular adenomas, last found in 2008  . Hyperplastic colon polyp 03/19/10   tcs by Dr. Gala Romney  . Hypertension 20 years   . Kidney stone    hx/ crushed   . Opioid contract exists 04/18/2015   With Dr. Moshe Cipro  . Pancreatic cancer ([place]) 02/2014  . Pancreatic cancer metastasized to liver ([place]) 03/10/2014  . Schatzki's ring 08/26/10   Last dilated on EGD by Dr. Trevor Iha [facility], linear gastric erosions, BI hemigastrectomy    Past Surgical History:  Procedure Laterality Date  .  BREAST LUMPECTOMY Left   . bunion removal     from both feet   . CHOLECYSTECTOMY  1965   . COLONOSCOPY  03/19/2010   DR Gala Romney,, normal TI, pancolonic diverticula, random colon bx neg., hyperplastic polyps removed  . ESOPHAGOGASTRODUODENOSCOPY  11/22/2003   DR Gala Romney, erosive RE, Billroth I  . ESOPHAGOGASTRODUODENOSCOPY  08/26/10   Dr. Gala Romney- moderate severe ERE, Scahtzki ring s/p dilation, Billroth I, linear gastric erosions, bx-gastric xanthelasma  . LEFT SHOULDER SURGERY  2009   DR HARRISON  . ORIF ANKLE FRACTURE Right 05/22/2013   Procedure: OPEN REDUCTION INTERNAL FIXATION (ORIF) RIGHT ANKLE FRACTURE;  Surgeon: Sanjuana Kava, MD;  Location: [facility];  Service: Orthopedics;  Laterality: Right;  . stomach ulcer  50 years ago    had some of her stomach removed     Family History  Problem Relation Age of Onset  . Hypertension Mother   . Kidney failure Brother     X1 ON DIALYSIS  . Diabetes Sister   . Cancer Sister     X2  . Hypertension Father   . Liver disease Neg Hx   . Colon cancer Neg Hx     Social History   Social History  . Marital status: Widowed    Spouse name: N/A  . Number of children: 2  . Years of education: N/A   Occupational History  . retired from Teacher, adult education Retired   Social History Main Topics  . Smoking status: Former Smoker    Types: Cigarettes    Quit date: 11/09/2011  . Smokeless tobacco: Never Used     Comment: quit a few weeks ago   . Alcohol use No  . Drug use: No  . Sexual activity: No   Other Topics Concern  . None   Social History Narrative  . None     PHYSICAL EXAMINATION  ECOG PERFORMANCE STATUS: 1 - Symptomatic but completely ambulatory  There were no vitals filed for this visit.  GENERAL:alert, no distress, comfortable, cooperative, smiling and unaccompanied in chemo-recliner SKIN: skin color, texture, turgor are normal, no rashes or significant lesions HEAD: Normocephalic, No masses, lesions, tenderness or abnormalities EYES:  normal, EOMI, Conjunctiva are pink and non-injected EARS: External ears normal OROPHARYNX:lips, buccal mucosa, and tongue normal and mucous membranes are moist  NECK: supple, trachea midline LYMPH:  not examined BREAST:not examined LUNGS: clear to auscultation  HEART: regular rate & rhythm ABDOMEN:abdomen soft and normal bowel sounds BACK: Back symmetric, no curvature. EXTREMITIES:less then 2 second capillary refill, no joint deformities, effusion, or inflammation, no skin discoloration, no cyanosis  NEURO: alert & oriented x 3 with fluent speech, no focal motor/sensory deficits   LABORATORY DATA: CBC    Component Value Date/Time   WBC 4.3 02/06/2016 1221   RBC 3.54 (L) 02/06/2016  1221   HGB 10.2 (L) 02/06/2016 1221   HCT 31.1 (L) 02/06/2016 1221   PLT 99 (L) 02/06/2016 1221   MCV 87.9 02/06/2016 1221   MCH 28.8 02/06/2016 1221   MCHC 32.8 02/06/2016 1221   RDW 17.6 (H) 02/06/2016 1221   LYMPHSABS 0.9 02/06/2016 1221   MONOABS 0.0 (L) 02/06/2016 1221   EOSABS 0.0 02/06/2016 1221   BASOSABS 0.0 02/06/2016 1221      Chemistry      Component Value Date/Time   NA 139 02/06/2016 1221   K 4.1 02/06/2016 1221   CL 108 02/06/2016 1221   CO2 23 02/06/2016 1221   BUN 28 (H) 02/06/2016 1221   CREATININE 1.20 (H) 02/06/2016 1221   CREATININE 0.97 02/14/2014 0758      Component Value Date/Time   CALCIUM 8.6 (L) 02/06/2016 1221   ALKPHOS 58 02/06/2016 1221   AST 16 02/06/2016 1221   ALT 17 02/06/2016 1221   BILITOT 0.7 02/06/2016 1221        PENDING LABS:   RADIOGRAPHIC STUDIES:  Ct Chest Wo Contrast  Result Date: 01/23/2016 CLINICAL DATA:  Restaging stage IV pancreatic cancer, weight loss. EXAM: CT CHEST WITHOUT CONTRAST TECHNIQUE: Multidetector CT imaging of the chest was performed following the standard protocol without IV contrast. COMPARISON:  MR abdomen 03/2016 and CT chest 06/05/2014. FINDINGS: Cardiovascular: Right IJ Port-A-Cath terminates in the low SVC.  Extensive atherosclerotic calcification of the arterial vasculature, including three-vessel involvement of the coronary arteries. Heart size normal. No pericardial effusion. Mediastinum/Nodes: Low-attenuation thyroid nodules measure up to approximately 2.8 cm on the right, possibly new. All there are calcified mediastinal lymph nodes. No pathologically enlarged mediastinal or axillary lymph nodes. Hilar regions are difficult to definitively evaluate without IV contrast. The esophagus is minimally dilated throughout its course. Lungs/Pleura: Biapical pleural parenchymal scarring. Mild to moderate centrilobular and paraseptal emphysema. 2 mm peripheral left lower lobe nodule (series 3, image 111), unchanged. Lungs are otherwise clear. No pleural fluid. Airway is unremarkable. Upper Abdomen: Visualized portion of the liver is grossly unremarkable. Cholecystectomy. Biliary ductal dilatation, similar. Adrenal glands are unremarkable. Kidneys and pancreas were better evaluated on MR abdomen 12/21/2015. Visualized portions of the spleen, stomach and bowel are grossly unremarkable. Musculoskeletal: No worrisome lytic or sclerotic lesions. Degenerative changes are seen in the spine and shoulders. IMPRESSION: 1. No evidence of metastatic disease. 2. Bilateral low attenuation thyroid lesions may be new. Consider further evaluation with thyroid ultrasound, as pertinent to the patient's age and clinical status. If patient is clinically hyperthyroid, consider nuclear medicine thyroid uptake and scan. 3. Aortic atherosclerosis and three-vessel coronary artery calcification. Electronically Signed   By: Lorin Picket M.D.   On: 01/23/2016 10:44     PATHOLOGY:    ASSESSMENT AND PLAN:  Pancreatic cancer metastasized to liver Stage IV Pancreatic Cancer, on systemic therapy with Abraxane and Gemzar in a day 1, 8 fashion every 28 days.  Continued response to therapy.  Oncology history is updated.   Patient has entered into  a pain contract with her primary care provider for non-oncologic pain.  She has been followed by Dr. Luna Glasgow (Ortho) for her knee and ankle pain.    Pre-chemo labs as ordered: CBC diff, CMET, CA 19-9.   I personally reviewed and went over laboratory results with the patient.  The results are noted within this dictation.    DNR  She had some upper teeth extracted.  She will be fitted for an upper denture in ~  2 months.  I personally reviewed and went over radiographic studies with the patient.  The results are noted within this dictation.    Return in 3-4 weeks for follow-up.   ORDERS PLACED FOR THIS ENCOUNTER: No orders of the defined types were placed in this encounter.   MEDICATIONS PRESCRIBED THIS ENCOUNTER: No orders of the defined types were placed in this encounter.   THERAPY PLAN:  Continue treatment as planned.  All questions were answered. The patient knows to call the clinic with any problems, questions or concerns. We can certainly see the patient much sooner if necessary.  Patient and plan discussed with Dr. Ancil Linsey and she is in agreement with the aforementioned.   This note is electronically signed by: Doy Mince 02/06/2016 3:20 PM

## 2016-02-06 NOTE — Progress Notes (Signed)
Aranesp held today - hemoglobin 10.2.

## 2016-02-07 LAB — CANCER ANTIGEN 19-9: CA 19 9: 38 U/mL — AB (ref 0–35)

## 2016-02-07 NOTE — Patient Instructions (Signed)
[facility] Discharge Instructions for Patients Receiving Chemotherapy   Beginning January 23rd 2017 lab work for the Cancer Center will be done in the  Main lab at [facility] on 1st floor. If you have a lab appointment with the Cancer Center please come in thru the  Main Entrance and check in at the main information desk   Today you received the following chemotherapy agents:  Abraxane and Gemzar.  If you develop nausea and vomiting, or diarrhea that is not controlled by your medication, call the clinic.  The clinic phone number is [phone]. Office hours are Monday-Friday 8:30am-5:00pm.  BELOW ARE SYMPTOMS THAT SHOULD BE REPORTED IMMEDIATELY:  *FEVER GREATER THAN 101.0 F  *CHILLS WITH OR WITHOUT FEVER  NAUSEA AND VOMITING THAT IS NOT CONTROLLED WITH YOUR NAUSEA MEDICATION  *UNUSUAL SHORTNESS OF BREATH  *UNUSUAL BRUISING OR BLEEDING  TENDERNESS IN MOUTH AND THROAT WITH OR WITHOUT PRESENCE OF ULCERS  *URINARY PROBLEMS  *BOWEL PROBLEMS  UNUSUAL RASH Items with * indicate a potential emergency and should be followed up as soon as possible. If you have an emergency after office hours please contact your primary care physician or go to the nearest emergency department.  Please call the clinic during office hours if you have any questions or concerns.   You may also contact the Patient Navigator at [phone] should you have any questions or need assistance in obtaining follow up care.      Resources For Cancer Patients and their Caregivers ? American Cancer Society: Can assist with transportation, wigs, general needs, runs Look Good Feel Better.        1-888-227-6333 ? Cancer Care: Provides financial assistance, online support groups, medication/co-pay assistance.  1-800-813-HOPE (4673) ? [facility] Assists [place] cancer patients and their families through emotional , educational and financial support.   336-427-4357 ? [place] DSS Where to apply for food stamps, Medicaid and utility assistance. 336-342-1394 ? [employer]: Transportation to medical appointments. 336-347-2287 ? Social Security Administration: May apply for disability if have a Stage IV cancer. 336-342-7796 1-800-772-1213 ? [place] Aging, Disability and Transit Services: Assists with nutrition, care and transit needs. [phone]         

## 2016-02-09 ENCOUNTER — Other Ambulatory Visit: Payer: Self-pay | Admitting: Family Medicine

## 2016-02-19 ENCOUNTER — Other Ambulatory Visit (HOSPITAL_COMMUNITY): Payer: Self-pay | Admitting: Oncology

## 2016-02-19 DIAGNOSIS — R530 Neoplastic (malignant) related fatigue: Secondary | ICD-10-CM

## 2016-02-19 MED ORDER — PREDNISONE 5 MG PO TABS: ORAL_TABLET | ORAL | 3 refills | Status: DC

## 2016-02-26 ENCOUNTER — Other Ambulatory Visit (HOSPITAL_COMMUNITY): Payer: Self-pay | Admitting: Pharmacist

## 2016-02-26 NOTE — Progress Notes (Signed)
Tied tx plan to current pt weight.  62.2 kg

## 2016-02-27 ENCOUNTER — Ambulatory Visit (HOSPITAL_COMMUNITY): Payer: Medicare Other

## 2016-02-27 ENCOUNTER — Encounter (HOSPITAL_COMMUNITY): Payer: Medicare Other | Attending: Hematology and Oncology

## 2016-02-27 ENCOUNTER — Other Ambulatory Visit: Payer: Self-pay | Admitting: Family Medicine

## 2016-02-27 ENCOUNTER — Encounter (HOSPITAL_COMMUNITY): Payer: Medicare Other

## 2016-02-27 VITALS — BP 168/66 | HR 85 | Temp 98.0°F | Resp 18 | Wt 139.2 lb

## 2016-02-27 DIAGNOSIS — C252 Malignant neoplasm of tail of pancreas: Secondary | ICD-10-CM | POA: Diagnosis not present

## 2016-02-27 DIAGNOSIS — C801 Malignant (primary) neoplasm, unspecified: Secondary | ICD-10-CM | POA: Diagnosis not present

## 2016-02-27 DIAGNOSIS — C259 Malignant neoplasm of pancreas, unspecified: Secondary | ICD-10-CM | POA: Diagnosis not present

## 2016-02-27 DIAGNOSIS — Z5111 Encounter for antineoplastic chemotherapy: Secondary | ICD-10-CM

## 2016-02-27 DIAGNOSIS — C787 Secondary malignant neoplasm of liver and intrahepatic bile duct: Secondary | ICD-10-CM | POA: Insufficient documentation

## 2016-02-27 LAB — CBC WITH DIFFERENTIAL/PLATELET
Basophils Absolute: 0 10*3/uL (ref 0.0–0.1)
Basophils Relative: 0 %
Eosinophils Absolute: 0 10*3/uL (ref 0.0–0.7)
Eosinophils Relative: 0 %
HEMATOCRIT: 33.5 % — AB (ref 36.0–46.0)
HEMOGLOBIN: 11 g/dL — AB (ref 12.0–15.0)
LYMPHS ABS: 1.2 10*3/uL (ref 0.7–4.0)
LYMPHS PCT: 11 %
MCH: 29.3 pg (ref 26.0–34.0)
MCHC: 32.8 g/dL (ref 30.0–36.0)
MCV: 89.1 fL (ref 78.0–100.0)
MONOS PCT: 9 %
Monocytes Absolute: 1 10*3/uL (ref 0.1–1.0)
NEUTROS ABS: 8.7 10*3/uL — AB (ref 1.7–7.7)
NEUTROS PCT: 79 %
Platelets: 238 10*3/uL (ref 150–400)
RBC: 3.76 MIL/uL — ABNORMAL LOW (ref 3.87–5.11)
RDW: 18.8 % — ABNORMAL HIGH (ref 11.5–15.5)
WBC: 11 10*3/uL — ABNORMAL HIGH (ref 4.0–10.5)

## 2016-02-27 LAB — COMPREHENSIVE METABOLIC PANEL
ALT: 16 U/L (ref 14–54)
ANION GAP: 7 (ref 5–15)
AST: 21 U/L (ref 15–41)
Albumin: 3.8 g/dL (ref 3.5–5.0)
Alkaline Phosphatase: 66 U/L (ref 38–126)
BUN: 24 mg/dL — ABNORMAL HIGH (ref 6–20)
CHLORIDE: 107 mmol/L (ref 101–111)
CO2: 23 mmol/L (ref 22–32)
CREATININE: 0.98 mg/dL (ref 0.44–1.00)
Calcium: 8.7 mg/dL — ABNORMAL LOW (ref 8.9–10.3)
GFR calc non Af Amer: 51 mL/min — ABNORMAL LOW (ref >60)
GFR, EST AFRICAN AMERICAN: 59 mL/min — AB (ref >60)
Glucose, Bld: 111 mg/dL — ABNORMAL HIGH (ref 65–99)
POTASSIUM: 4 mmol/L (ref 3.5–5.1)
SODIUM: 137 mmol/L (ref 135–145)
Total Bilirubin: 0.9 mg/dL (ref 0.3–1.2)
Total Protein: 6.6 g/dL (ref 6.5–8.1)

## 2016-02-27 MED ORDER — DEXAMETHASONE SODIUM PHOSPHATE 10 MG/ML IJ SOLN
10.0000 mg | Freq: Once | INTRAMUSCULAR | Status: AC
  Administered 2016-02-27: 10 mg via INTRAVENOUS
  Filled 2016-02-27: qty 1

## 2016-02-27 MED ORDER — ONDANSETRON 8 MG PO TBDP
8.0000 mg | ORAL_TABLET | Freq: Once | ORAL | Status: AC
  Administered 2016-02-27: 8 mg via ORAL
  Filled 2016-02-27: qty 1

## 2016-02-27 MED ORDER — SODIUM CHLORIDE 0.9 % IJ SOLN: 10.0000 mL | INTRAMUSCULAR | Status: DC | PRN

## 2016-02-27 MED ORDER — PACLITAXEL PROTEIN-BOUND CHEMO INJECTION 100 MG
125.0000 mg/m2 | Freq: Once | INTRAVENOUS | Status: AC
  Administered 2016-02-27: 225 mg via INTRAVENOUS
  Filled 2016-02-27: qty 45

## 2016-02-27 MED ORDER — HEPARIN SOD (PORK) LOCK FLUSH 100 UNIT/ML IV SOLN
500.0000 [IU] | Freq: Once | INTRAVENOUS | Status: AC | PRN
  Administered 2016-02-27: 500 [IU]
  Filled 2016-02-27: qty 5

## 2016-02-27 MED ORDER — SODIUM CHLORIDE 0.9 % IV SOLN
Freq: Once | INTRAVENOUS | Status: AC
  Administered 2016-02-27: 10:00:00 via INTRAVENOUS

## 2016-02-27 MED ORDER — SODIUM CHLORIDE 0.9 % IV SOLN
1000.0000 mg/m2 | Freq: Once | INTRAVENOUS | Status: AC
  Administered 2016-02-27: 1786 mg via INTRAVENOUS
  Filled 2016-02-27: qty 46.97

## 2016-02-27 NOTE — Progress Notes (Signed)
Tolerated tx w/o adverse reaction.  Alert, in no distress.  VSS.  Discharged ambulatory. 

## 2016-02-27 NOTE — Progress Notes (Signed)
See infusion encounter.  

## 2016-02-27 NOTE — Patient Instructions (Signed)
[facility] Discharge Instructions for Patients Receiving Chemotherapy   Beginning January 23rd 2017 lab work for the Cancer Center will be done in the  Main lab at [place] on 1st floor. If you have a lab appointment with the Cancer Center please come in thru the  Main Entrance and check in at the main information desk   Today you received the following chemotherapy agents:  Abraxane and Gemzar.  If you develop nausea and vomiting, or diarrhea that is not controlled by your medication, call the clinic.  The clinic phone number is [phone]. Office hours are Monday-Friday 8:30am-5:00pm.  BELOW ARE SYMPTOMS THAT SHOULD BE REPORTED IMMEDIATELY:  *FEVER GREATER THAN 101.0 F  *CHILLS WITH OR WITHOUT FEVER  NAUSEA AND VOMITING THAT IS NOT CONTROLLED WITH YOUR NAUSEA MEDICATION  *UNUSUAL SHORTNESS OF BREATH  *UNUSUAL BRUISING OR BLEEDING  TENDERNESS IN MOUTH AND THROAT WITH OR WITHOUT PRESENCE OF ULCERS  *URINARY PROBLEMS  *BOWEL PROBLEMS  UNUSUAL RASH Items with * indicate a potential emergency and should be followed up as soon as possible. If you have an emergency after office hours please contact your primary care physician or go to the nearest emergency department.  Please call the clinic during office hours if you have any questions or concerns.   You may also contact the Patient Navigator at [phone] should you have any questions or need assistance in obtaining follow up care.      Resources For Cancer Patients and their Caregivers ? American Cancer Society: Can assist with transportation, wigs, general needs, runs Look Good Feel Better.        1-888-227-6333 ? Cancer Care: Provides financial assistance, online support groups, medication/co-pay assistance.  1-800-813-HOPE (4673) ? [facility] Assists [place] cancer patients and their families through emotional , educational and financial support.   336-427-4357 ? [place] DSS Where to apply for food stamps, Medicaid and utility assistance. 336-342-1394 ? RCATS: Transportation to medical appointments. 336-347-2287 ? Social Security Administration: May apply for disability if have a Stage IV cancer. 336-342-7796 1-800-772-1213 ? [place] Aging, Disability and Transit Services: Assists with nutrition, care and transit needs. 336-349-2343         

## 2016-02-28 ENCOUNTER — Other Ambulatory Visit (HOSPITAL_COMMUNITY): Payer: Self-pay | Admitting: Hematology & Oncology

## 2016-02-28 ENCOUNTER — Telehealth: Payer: Self-pay | Admitting: Family Medicine

## 2016-02-28 DIAGNOSIS — C787 Secondary malignant neoplasm of liver and intrahepatic bile duct: Principal | ICD-10-CM

## 2016-02-28 DIAGNOSIS — R634 Abnormal weight loss: Secondary | ICD-10-CM

## 2016-02-28 DIAGNOSIS — R197 Diarrhea, unspecified: Secondary | ICD-10-CM

## 2016-02-28 DIAGNOSIS — C259 Malignant neoplasm of pancreas, unspecified: Secondary | ICD-10-CM

## 2016-02-28 NOTE — Telephone Encounter (Signed)
Med refilled.

## 2016-02-28 NOTE — Telephone Encounter (Signed)
[name] is calling requesting a refill on clonazePAM (KLONOPIN) 0.5 MG tablet asap, please advise?

## 2016-03-04 ENCOUNTER — Other Ambulatory Visit (HOSPITAL_COMMUNITY): Payer: Self-pay

## 2016-03-04 DIAGNOSIS — R197 Diarrhea, unspecified: Secondary | ICD-10-CM

## 2016-03-04 DIAGNOSIS — R634 Abnormal weight loss: Secondary | ICD-10-CM

## 2016-03-04 DIAGNOSIS — C259 Malignant neoplasm of pancreas, unspecified: Secondary | ICD-10-CM

## 2016-03-04 DIAGNOSIS — C787 Secondary malignant neoplasm of liver and intrahepatic bile duct: Principal | ICD-10-CM

## 2016-03-04 MED ORDER — MEGESTROL ACETATE 40 MG/ML PO SUSP: ORAL | 1 refills | Status: DC

## 2016-03-04 NOTE — Telephone Encounter (Signed)
Received refill request for megace from patients pharmacy. Chart checked and refilled.

## 2016-03-05 ENCOUNTER — Encounter (HOSPITAL_BASED_OUTPATIENT_CLINIC_OR_DEPARTMENT_OTHER): Payer: Medicare Other

## 2016-03-05 ENCOUNTER — Encounter (HOSPITAL_COMMUNITY): Payer: Self-pay | Admitting: Oncology

## 2016-03-05 ENCOUNTER — Encounter (HOSPITAL_BASED_OUTPATIENT_CLINIC_OR_DEPARTMENT_OTHER): Payer: Medicare Other | Admitting: Oncology

## 2016-03-05 VITALS — BP 113/74 | HR 58 | Temp 97.8°F | Resp 18 | Wt 137.4 lb

## 2016-03-05 DIAGNOSIS — C787 Secondary malignant neoplasm of liver and intrahepatic bile duct: Secondary | ICD-10-CM

## 2016-03-05 DIAGNOSIS — Z5111 Encounter for antineoplastic chemotherapy: Secondary | ICD-10-CM | POA: Diagnosis present

## 2016-03-05 DIAGNOSIS — C259 Malignant neoplasm of pancreas, unspecified: Secondary | ICD-10-CM

## 2016-03-05 DIAGNOSIS — C801 Malignant (primary) neoplasm, unspecified: Secondary | ICD-10-CM | POA: Diagnosis not present

## 2016-03-05 DIAGNOSIS — C252 Malignant neoplasm of tail of pancreas: Secondary | ICD-10-CM | POA: Diagnosis not present

## 2016-03-05 LAB — COMPREHENSIVE METABOLIC PANEL
ALBUMIN: 3.9 g/dL (ref 3.5–5.0)
ALK PHOS: 58 U/L (ref 38–126)
ALT: 15 U/L (ref 14–54)
AST: 17 U/L (ref 15–41)
Anion gap: 6 (ref 5–15)
BILIRUBIN TOTAL: 0.9 mg/dL (ref 0.3–1.2)
BUN: 21 mg/dL — AB (ref 6–20)
CALCIUM: 8.7 mg/dL — AB (ref 8.9–10.3)
CO2: 25 mmol/L (ref 22–32)
CREATININE: 1.01 mg/dL — AB (ref 0.44–1.00)
Chloride: 105 mmol/L (ref 101–111)
GFR calc Af Amer: 57 mL/min — ABNORMAL LOW (ref >60)
GFR, EST NON AFRICAN AMERICAN: 49 mL/min — AB (ref >60)
GLUCOSE: 110 mg/dL — AB (ref 65–99)
POTASSIUM: 4 mmol/L (ref 3.5–5.1)
Sodium: 136 mmol/L (ref 135–145)
TOTAL PROTEIN: 6.6 g/dL (ref 6.5–8.1)

## 2016-03-05 LAB — CBC WITH DIFFERENTIAL/PLATELET
BASOS ABS: 0 10*3/uL (ref 0.0–0.1)
Basophils Relative: 0 %
Eosinophils Absolute: 0 10*3/uL (ref 0.0–0.7)
Eosinophils Relative: 0 %
HEMATOCRIT: 32.4 % — AB (ref 36.0–46.0)
Hemoglobin: 10.8 g/dL — ABNORMAL LOW (ref 12.0–15.0)
LYMPHS ABS: 1.4 10*3/uL (ref 0.7–4.0)
Lymphocytes Relative: 16 %
MCH: 29.8 pg (ref 26.0–34.0)
MCHC: 33.3 g/dL (ref 30.0–36.0)
MCV: 89.3 fL (ref 78.0–100.0)
MONO ABS: 0.2 10*3/uL (ref 0.1–1.0)
MONOS PCT: 2 %
NEUTROS ABS: 7.2 10*3/uL (ref 1.7–7.7)
Neutrophils Relative %: 82 %
PLATELETS: 129 10*3/uL — AB (ref 150–400)
RBC: 3.63 MIL/uL — AB (ref 3.87–5.11)
RDW: 16.8 % — AB (ref 11.5–15.5)
WBC: 8.8 10*3/uL (ref 4.0–10.5)

## 2016-03-05 MED ORDER — DEXAMETHASONE SODIUM PHOSPHATE 10 MG/ML IJ SOLN
10.0000 mg | Freq: Once | INTRAMUSCULAR | Status: AC
  Administered 2016-03-05: 10 mg via INTRAVENOUS
  Filled 2016-03-05: qty 1

## 2016-03-05 MED ORDER — SODIUM CHLORIDE 0.9 % IV SOLN
Freq: Once | INTRAVENOUS | Status: AC
  Administered 2016-03-05: 12:00:00 via INTRAVENOUS

## 2016-03-05 MED ORDER — HEPARIN SOD (PORK) LOCK FLUSH 100 UNIT/ML IV SOLN
500.0000 [IU] | Freq: Once | INTRAVENOUS | Status: AC
  Administered 2016-03-05: 500 [IU] via INTRAVENOUS

## 2016-03-05 MED ORDER — SODIUM CHLORIDE 0.9% FLUSH
10.0000 mL | INTRAVENOUS | Status: DC | PRN
  Administered 2016-03-05: 10 mL via INTRAVENOUS
  Filled 2016-03-05: qty 10

## 2016-03-05 MED ORDER — SODIUM CHLORIDE 0.9 % IJ SOLN: 10.0000 mL | INTRAMUSCULAR | Status: DC | PRN

## 2016-03-05 MED ORDER — PACLITAXEL PROTEIN-BOUND CHEMO INJECTION 100 MG
125.0000 mg/m2 | Freq: Once | INTRAVENOUS | Status: AC
  Administered 2016-03-05: 225 mg via INTRAVENOUS
  Filled 2016-03-05: qty 5

## 2016-03-05 MED ORDER — HEPARIN SOD (PORK) LOCK FLUSH 100 UNIT/ML IV SOLN
500.0000 [IU] | Freq: Once | INTRAVENOUS | Status: DC | PRN
  Filled 2016-03-05: qty 5

## 2016-03-05 MED ORDER — ONDANSETRON 8 MG PO TBDP
8.0000 mg | ORAL_TABLET | Freq: Once | ORAL | Status: AC
  Administered 2016-03-05: 8 mg via ORAL
  Filled 2016-03-05: qty 1

## 2016-03-05 MED ORDER — GEMCITABINE HCL CHEMO INJECTION 1 GM/26.3ML
1000.0000 mg/m2 | Freq: Once | INTRAVENOUS | Status: AC
  Administered 2016-03-05: 1786 mg via INTRAVENOUS
  Filled 2016-03-05: qty 46.97

## 2016-03-05 NOTE — Progress Notes (Signed)
[name] tolerated chemo tx well without complaints or incident. Labs reviewed prior to administering chemo.HGB 10.8 so Aranesp held today VSS upon discharge. Pt discharged self ambulatory using cane accompanied by family member

## 2016-03-05 NOTE — Patient Instructions (Signed)
[facility] Discharge Instructions for Patients Receiving Chemotherapy   Beginning January 23rd 2017 lab work for the Cancer Center will be done in the  Main lab at [place] on 1st floor. If you have a lab appointment with the Cancer Center please come in thru the  Main Entrance and check in at the main information desk   Today you received the following chemotherapy agents Abraxane and Gemzar. Follow-up as scheduled. Call clinic for any questions or concerns  To help prevent nausea and vomiting after your treatment, we encourage you to take your nausea medication   If you develop nausea and vomiting, or diarrhea that is not controlled by your medication, call the clinic.  The clinic phone number is [phone]. Office hours are Monday-Friday 8:30am-5:00pm.  BELOW ARE SYMPTOMS THAT SHOULD BE REPORTED IMMEDIATELY:  *FEVER GREATER THAN 101.0 F  *CHILLS WITH OR WITHOUT FEVER  NAUSEA AND VOMITING THAT IS NOT CONTROLLED WITH YOUR NAUSEA MEDICATION  *UNUSUAL SHORTNESS OF BREATH  *UNUSUAL BRUISING OR BLEEDING  TENDERNESS IN MOUTH AND THROAT WITH OR WITHOUT PRESENCE OF ULCERS  *URINARY PROBLEMS  *BOWEL PROBLEMS  UNUSUAL RASH Items with * indicate a potential emergency and should be followed up as soon as possible. If you have an emergency after office hours please contact your primary care physician or go to the nearest emergency department.  Please call the clinic during office hours if you have any questions or concerns.   You may also contact the Patient Navigator at [phone] should you have any questions or need assistance in obtaining follow up care.      Resources For Cancer Patients and their Caregivers ? American Cancer Society: Can assist with transportation, wigs, general needs, runs Look Good Feel Better.        1-888-227-6333 ? Cancer Care: Provides financial assistance, online support groups, medication/co-pay assistance.   1-800-813-HOPE (4673) ? [facility] Assists [place] cancer patients and their families through emotional , educational and financial support.  336-427-4357 ? [place] DSS Where to apply for food stamps, Medicaid and utility assistance. 336-342-1394 ? RCATS: Transportation to medical appointments. 336-347-2287 ? Social Security Administration: May apply for disability if have a Stage IV cancer. 336-342-7796 1-800-772-1213 ? [place] Aging, Disability and Transit Services: Assists with nutrition, care and transit needs. 336-349-2343         

## 2016-03-05 NOTE — Assessment & Plan Note (Addendum)
Stage IV Pancreatic Cancer, on systemic therapy with Abraxane and Gemzar in a day 1, 8 fashion every 28 days.  Continued response to therapy.  Most recent imaging demonstrates no evidence of identifiable disease.  We opted to provide the patient a chemotherapy holiday/break after 2 years worth of systemic chemotherapy.  Oncology history is updated.   Patient has entered into a pain contract with her primary care provider for non-oncologic pain.  She has been followed by Dr. Luna Glasgow (Ortho) for her knee and ankle pain.    Pre-chemo labs as ordered: CBC diff, CMET, CA 19-9.   I personally reviewed and went over laboratory results with the patient.  The results are noted within this dictation.    DNR  She had some upper teeth extracted.  She will be fitted for an upper denture in ~ 2 weeks.  We have discussed a treatment holiday given her quality of life and overall clinical picture.  She certainly is not as robust as she once was when we started treatment.  She is pleased to pursue this and therefore we will cancel chemotherapy moving forward.  Chemotherapy plan is discontinued.  Labs in 4 weeks: CBC diff, CMET, CA 19-9.  Return in 4 weeks for follow-up.  At this time, timing of future imaging can be discussed and arranged.

## 2016-03-05 NOTE — Progress Notes (Signed)
[name] Nakayama, MD [address] [place] [address]  Pancreatic cancer metastasized to liver [facility]) - Plan: CBC with Differential, Comprehensive metabolic panel, Cancer antigen 19-9, sodium chloride flush (NS) 0.9 % injection 10 mL, heparin lock flush 100 unit/mL  CURRENT THERAPY: Gemzar/Abraxane  INTERVAL HISTORY: [name] 82 y.o. female returns for followup of Stage IV pancreatic cancer.      Pancreatic cancer metastasized to liver ([place])   03/10/2014 Initial Diagnosis    Pancreatic cancer metastasized to liver      03/22/2014 - 03/05/2016 Chemotherapy    Abraxane/Gemzar days 1, 8, every 28 days.  Day 15 was cancelled due to leukopenia and thrombocytopenia on day 15 cycle 1.      05/24/2014 Treatment Plan Change    Day 8 of cycle 3 is held with ANC of 1.1      06/05/2014 Imaging    CT C/A/P . Interval decrease in size of the pancreatic tail mass.Improved hepatic metastatic disease. No new lesions. No CT findings for metastatic disease involving the chest.      08/09/2014 Tumor Marker    CA 19-9= 33 (WNL)      10/26/2014 Imaging    MRI- Continued interval decrease in size of the hepatic metastatic lesions and no new lesions are identified. Continued decrease in size of the pancreatic tail lesion.      01/03/2015 Tumor Marker    Results for [name], [name] (MRN [id number]) as of 01/18/2015 12:52  01/03/2015 10:00 CA 19-9: 14       01/17/2015 Imaging    MRI- Response to therapy of hepatic metastasis.  Similar size of a pancreatic tail lesion.      03/20/2015 Imaging    MRI-L spine- Severe disc space narrowing at L2-L3, with endplate reactive changes. Large disc extrusion into the ventral epidural space,central to the RIGHT with a cephalad migrated free fragment. Additional Large disc extrusion into the retroperitone...      03/22/2015 Imaging    CT pelvis- No evidence for metastatic disease within the pelvis.      07/24/2015 Imaging    MRI  abd- Continued response to therapy, with no residual detectable liver metastases. No new sites of metastatic disease in the abdomen.       08/15/2015 Code Status     She confirms desire for DNR status.      12/21/2015 Imaging    MRI liver- Severe image degradation due to motion artifact reducing diagnostic sensitivity and specificity. Reduce conspicuity of the pancreatic tail lesion suggesting further improvement. The original liver lesions have resolved.      03/05/2016 Treatment Plan Change    Chemotherapy holiday/Break after 2 years worth of treatment       She is doing well.  HOWEVER, she certainly has declined with regards to her clinical status since starting chemotherapy.  She has experienced a few toxicities throughout treatment.  She denies any current complaints.  She thinks she is doing well.  Weight is stable.  Her appetite is good.  Review of Systems  Constitutional: Negative.  Negative for chills, fever and weight loss.  HENT: Negative.   Eyes: Negative.  Negative for blurred vision.  Respiratory: Negative.  Negative for cough.   Cardiovascular: Negative.  Negative for chest pain.  Gastrointestinal: Negative.   Genitourinary: Negative.   Musculoskeletal: Negative.   Skin: Negative.   Neurological: Negative.   Endo/Heme/Allergies: Negative.   Psychiatric/Behavioral: Negative.     Past  Medical History:  Diagnosis Date  . Anemia due to antineoplastic chemotherapy 09/12/2015   Started Aranesp 500 mcg on 09/12/2015  . Anxiety   . Chronic diarrhea   . Depression   . DNR (do not resuscitate) 08/16/2015  . Erosive esophagitis   . GERD (gastroesophageal reflux disease)   . Hx of adenomatous colonic polyps    tubular adenomas, last found in 2008  . Hyperplastic colon polyp 03/19/10   tcs by Dr. Gala Romney  . Hypertension 20 years   . Kidney stone    hx/ crushed   . Opioid contract exists 04/18/2015   With Dr. Moshe Cipro  . Pancreatic cancer ([place]) 02/2014  . Pancreatic  cancer metastasized to liver ([place]) 03/10/2014  . Schatzki's ring 08/26/10   Last dilated on EGD by Dr. Trevor Iha [facility], linear gastric erosions, BI hemigastrectomy    Past Surgical History:  Procedure Laterality Date  . BREAST LUMPECTOMY Left   . bunion removal     from both feet   . CHOLECYSTECTOMY  1965   . COLONOSCOPY  03/19/2010   DR Gala Romney,, normal TI, pancolonic diverticula, random colon bx neg., hyperplastic polyps removed  . ESOPHAGOGASTRODUODENOSCOPY  11/22/2003   DR Gala Romney, erosive RE, Billroth I  . ESOPHAGOGASTRODUODENOSCOPY  08/26/10   Dr. Gala Romney- moderate severe ERE, Scahtzki ring s/p dilation, Billroth I, linear gastric erosions, bx-gastric xanthelasma  . LEFT SHOULDER SURGERY  2009   DR HARRISON  . ORIF ANKLE FRACTURE Right 05/22/2013   Procedure: OPEN REDUCTION INTERNAL FIXATION (ORIF) RIGHT ANKLE FRACTURE;  Surgeon: Sanjuana Kava, MD;  Location: [facility];  Service: Orthopedics;  Laterality: Right;  . stomach ulcer  50 years ago    had some of her stomach removed     Family History  Problem Relation Age of Onset  . Hypertension Mother   . Kidney failure Brother     X1 ON DIALYSIS  . Diabetes Sister   . Cancer Sister     X2  . Hypertension Father   . Liver disease Neg Hx   . Colon cancer Neg Hx     Social History   Social History  . Marital status: Widowed    Spouse name: N/A  . Number of children: 2  . Years of education: N/A   Occupational History  . retired from Teacher, adult education Retired   Social History Main Topics  . Smoking status: Former Smoker    Types: Cigarettes    Quit date: 11/09/2011  . Smokeless tobacco: Never Used     Comment: quit a few weeks ago   . Alcohol use No  . Drug use: No  . Sexual activity: No   Other Topics Concern  . None   Social History Narrative  . None     PHYSICAL EXAMINATION  ECOG PERFORMANCE STATUS: 1 - Symptomatic but completely ambulatory  Vitals:   03/05/16 1440  BP: (!) 156/69  Pulse: 70  Resp: 18  Temp: 98.3  F (36.8 C)    GENERAL:alert, no distress, comfortable, cooperative, smiling and accompanied by sister. SKIN: skin color, texture, turgor are normal, no rashes or significant lesions HEAD: Normocephalic, No masses, lesions, tenderness or abnormalities EYES: normal, EOMI, Conjunctiva are pink and non-injected EARS: External ears normal OROPHARYNX:lips, buccal mucosa, and tongue normal and edentulous  NECK: supple, trachea midline LYMPH:  no palpable lymphadenopathy BREAST:not examined LUNGS: clear to auscultation  HEART: regular rate & rhythm, no murmurs and no gallops ABDOMEN:abdomen soft and normal bowel sounds BACK: Back symmetric, no curvature.  EXTREMITIES:less then 2 second capillary refill, no joint deformities, effusion, or inflammation, no edema, no skin discoloration, no clubbing, no cyanosis  NEURO: alert & oriented x 3 with fluent speech, no focal motor/sensory deficits   LABORATORY DATA: CBC    Component Value Date/Time   WBC 8.8 03/05/2016 1040   RBC 3.63 (L) 03/05/2016 1040   HGB 10.8 (L) 03/05/2016 1040   HCT 32.4 (L) 03/05/2016 1040   PLT 129 (L) 03/05/2016 1040   MCV 89.3 03/05/2016 1040   MCH 29.8 03/05/2016 1040   MCHC 33.3 03/05/2016 1040   RDW 16.8 (H) 03/05/2016 1040   LYMPHSABS 1.4 03/05/2016 1040   MONOABS 0.2 03/05/2016 1040   EOSABS 0.0 03/05/2016 1040   BASOSABS 0.0 03/05/2016 1040      Chemistry      Component Value Date/Time   NA 136 03/05/2016 1040   K 4.0 03/05/2016 1040   CL 105 03/05/2016 1040   CO2 25 03/05/2016 1040   BUN 21 (H) 03/05/2016 1040   CREATININE 1.01 (H) 03/05/2016 1040   CREATININE 0.97 02/14/2014 0758      Component Value Date/Time   CALCIUM 8.7 (L) 03/05/2016 1040   ALKPHOS 58 03/05/2016 1040   AST 17 03/05/2016 1040   ALT 15 03/05/2016 1040   BILITOT 0.9 03/05/2016 1040       PENDING LABS:   RADIOGRAPHIC STUDIES:  No results found.   PATHOLOGY:    ASSESSMENT AND PLAN:  Pancreatic cancer  metastasized to liver Stage IV Pancreatic Cancer, on systemic therapy with Abraxane and Gemzar in a day 1, 8 fashion every 28 days.  Continued response to therapy.  Most recent imaging demonstrates no evidence of identifiable disease.  We opted to provide the patient a chemotherapy holiday/break after 2 years worth of systemic chemotherapy.  Oncology history is updated.   Patient has entered into a pain contract with her primary care provider for non-oncologic pain.  She has been followed by Dr. Luna Glasgow (Ortho) for her knee and ankle pain.    Pre-chemo labs as ordered: CBC diff, CMET, CA 19-9.   I personally reviewed and went over laboratory results with the patient.  The results are noted within this dictation.    DNR  She had some upper teeth extracted.  She will be fitted for an upper denture in ~ 2 weeks.  We have discussed a treatment holiday given her quality of life and overall clinical picture.  She certainly is not as robust as she once was when we started treatment.  She is pleased to pursue this and therefore we will cancel chemotherapy moving forward.  Chemotherapy plan is discontinued.  Labs in 4 weeks: CBC diff, CMET, CA 19-9.  Return in 4 weeks for follow-up.  At this time, timing of future imaging can be discussed and arranged.   ORDERS PLACED FOR THIS ENCOUNTER: Orders Placed This Encounter  Procedures  . CBC with Differential  . Comprehensive metabolic panel  . Cancer antigen 19-9    MEDICATIONS PRESCRIBED THIS ENCOUNTER: Meds ordered this encounter  Medications  . sodium chloride flush (NS) 0.9 % injection 10 mL  . heparin lock flush 100 unit/mL    THERAPY PLAN:  Chemotherapy HOLIDAY.  All questions were answered. The patient knows to call the clinic with any problems, questions or concerns. We can certainly see the patient much sooner if necessary.  Patient and plan discussed with Dr. Ancil Linsey and she is in agreement with the aforementioned.   This  note is electronically signed by: Doy Mince 03/05/2016 5:53 PM

## 2016-03-05 NOTE — Patient Instructions (Addendum)
[place] [facility] Discharge Instructions  RECOMMENDATIONS MADE BY THE CONSULTANT AND ANY TEST RESULTS WILL BE SENT TO YOUR REFERRING PHYSICIAN.  You were seen today by Kirby Crigler PA-C. We recommend providing you with a chemotherapy holiday or break.  We will cancel future chemotherapy for the time being Labs in 4 weeks. Return in 4 weeks for follow-up   Thank you for choosing [place] at [facility] to provide your oncology and hematology care.  To afford each patient quality time with our provider, please arrive at least 15 minutes before your scheduled appointment time.   Beginning January 23rd 2017 lab work for the [name] will be done in the  Main lab at [facility] on 1st floor. If you have a lab appointment with the [place] please come in thru the  Main Entrance and check in at the main information desk  You need to re-schedule your appointment should you arrive 10 or more minutes late.  We strive to give you quality time with our providers, and arriving late affects you and other patients whose appointments are after yours.  Also, if you no show three or more times for appointments you may be dismissed from the clinic at the providers discretion.     Again, thank you for choosing [facility].  Our hope is that these requests will decrease the amount of time that you wait before being seen by our physicians.       _____________________________________________________________  Should you have questions after your visit to [facility], please contact our office at [phone] between the hours of 8:30 a.m. and 4:30 p.m.  Voicemails left after 4:30 p.m. will not be returned until the following business day.  For prescription refill requests, have your pharmacy contact our office.         Resources For Cancer Patients and their Caregivers ? American Cancer Society: Can assist with  transportation, wigs, general needs, runs Look Good Feel Better.        (972)363-0346 ? Cancer Care: Provides financial assistance, online support groups, medication/co-pay assistance.  1-800-813-HOPE 3340400981) ? [facility] Assists [place] cancer patients and their families through emotional , educational and financial support.  531-574-2723 ? [place] DSS Where to apply for food stamps, Medicaid and utility assistance. (431)315-0545 ? RCATS: Transportation to medical appointments. (765)176-1298 ? Social Security Administration: May apply for disability if have a Stage IV cancer. ([phone] ? [employer], Disability and Transit Services: Assists with nutrition, care and transit needs. [place] Support Programs: @10RELATIVEDAYS @ > Cancer Support Group  2nd Tuesday of the month 1pm-2pm, Journey Room  > Creative Journey  3rd Tuesday of the month 1130am-1pm, Journey Room  > Look Good Feel Better  1st Wednesday of the month 10am-12 noon, Journey Room (Call [place] to register [phone])

## 2016-03-06 LAB — CANCER ANTIGEN 19-9: CA 19-9: 52 U/mL — ABNORMAL HIGH (ref 0–35)

## 2016-03-07 ENCOUNTER — Other Ambulatory Visit (HOSPITAL_COMMUNITY): Payer: Self-pay | Admitting: Pharmacist

## 2016-03-10 ENCOUNTER — Ambulatory Visit (INDEPENDENT_AMBULATORY_CARE_PROVIDER_SITE_OTHER): Payer: Medicare Other | Admitting: Family Medicine

## 2016-03-10 ENCOUNTER — Encounter: Payer: Self-pay | Admitting: Family Medicine

## 2016-03-10 ENCOUNTER — Other Ambulatory Visit (HOSPITAL_COMMUNITY)
Admission: RE | Admit: 2016-03-10 | Discharge: 2016-03-10 | Disposition: A | Discharge status: Home / Self Care | Payer: Medicare Other | Source: Ambulatory Visit | Attending: Family Medicine | Admitting: Family Medicine

## 2016-03-10 VITALS — BP 146/64 | HR 60 | Resp 18 | Wt 138.0 lb

## 2016-03-10 DIAGNOSIS — R7302 Impaired glucose tolerance (oral): Secondary | ICD-10-CM

## 2016-03-10 DIAGNOSIS — Z01411 Encounter for gynecological examination (general) (routine) with abnormal findings: Secondary | ICD-10-CM | POA: Diagnosis not present

## 2016-03-10 DIAGNOSIS — Z23 Encounter for immunization: Secondary | ICD-10-CM | POA: Insufficient documentation

## 2016-03-10 DIAGNOSIS — Z0000 Encounter for general adult medical examination without abnormal findings: Secondary | ICD-10-CM | POA: Diagnosis not present

## 2016-03-10 DIAGNOSIS — C787 Secondary malignant neoplasm of liver and intrahepatic bile duct: Secondary | ICD-10-CM

## 2016-03-10 DIAGNOSIS — Z124 Encounter for screening for malignant neoplasm of cervix: Secondary | ICD-10-CM | POA: Diagnosis not present

## 2016-03-10 DIAGNOSIS — Z1211 Encounter for screening for malignant neoplasm of colon: Secondary | ICD-10-CM

## 2016-03-10 DIAGNOSIS — C259 Malignant neoplasm of pancreas, unspecified: Secondary | ICD-10-CM

## 2016-03-10 DIAGNOSIS — I10 Essential (primary) hypertension: Secondary | ICD-10-CM

## 2016-03-10 DIAGNOSIS — Z13220 Encounter for screening for lipoid disorders: Secondary | ICD-10-CM

## 2016-03-10 DIAGNOSIS — E559 Vitamin D deficiency, unspecified: Secondary | ICD-10-CM

## 2016-03-10 DIAGNOSIS — J3089 Other allergic rhinitis: Secondary | ICD-10-CM

## 2016-03-10 LAB — POC HEMOCCULT BLD/STL (OFFICE/1-CARD/DIAGNOSTIC): Fecal Occult Blood, POC: NEGATIVE

## 2016-03-10 MED ORDER — AZELASTINE HCL 0.1 % NA SOLN: 2.0000 | Freq: Two times a day (BID) | NASAL | 3 refills | Status: DC

## 2016-03-10 NOTE — Assessment & Plan Note (Signed)
Uncontrolled, add astellin 

## 2016-03-10 NOTE — Patient Instructions (Addendum)
F/u in 3.5 month, call if you need me sooner  Pneumonia vaccine today Medication prescribed for uncontrolled allergies  Thankful all clear at this time ,   Labs fasting in 3.5 month. Cbc, chem 7, lipids and vit D

## 2016-03-11 DIAGNOSIS — H26493 Other secondary cataract, bilateral: Secondary | ICD-10-CM | POA: Diagnosis not present

## 2016-03-11 LAB — CYTOLOGY - PAP
Adequacy: ABSENT
DIAGNOSIS: NEGATIVE

## 2016-03-14 ENCOUNTER — Other Ambulatory Visit: Payer: Self-pay

## 2016-03-14 MED ORDER — HYDROCODONE-ACETAMINOPHEN 5-325 MG PO TABS: ORAL_TABLET | ORAL | 0 refills | Status: DC

## 2016-03-14 NOTE — Progress Notes (Signed)
    [name]     MRN: [id number]      DOB: [birth year]  HPI: Patient is in for annual physical exam. C/o increased nasal drainage , clear, no fever or chills, no sore throat or sinus pressure, denies productive cough or sputum Recent labs, if available are reviewed. Immunization is reviewed , and  updated if needed.   PE: Pleasant  female, alert and oriented x 3, in no cardio-pulmonary distress. Afebrile. HEENT No facial trauma or asymetry. Sinuses non tender.  Extra occullar muscles intact, pupils equally reactive to light. External ears normal, tympanic membranes clear. Oropharynx moist, no exudate. Nasal mucosa edematous and erythematous Neck: supple, no adenopathy,JVD or thyromegaly.No bruits.  Chest: Clear to ascultation bilaterally.No crackles or wheezes. Non tender to palpation  Breast: No asymetry,no masses or lumps. No tenderness. No nipple discharge or inversion. No axillary or supraclavicular adenopathy  Cardiovascular system; Heart sounds normal,  S1 and  S2 ,no S3.  No murmur, or thrill. Apical beat not displaced Peripheral pulses normal.  Abdomen: Soft, non tender, no organomegaly or masses. No bruits. Bowel sounds normal. No guarding, tenderness or rebound.  Rectal:  Normal sphincter tone. No rectal mass. Guaiac negative stool.  GU: External genitalia normal female genitalia , normal female distribution of hair. No lesions. Urethral meatus normal in size, no  Prolapse, no lesions visibly  Present. Bladder non tender. Vagina pink and moist , with no visible lesions , discharge present . Adequate pelvic support no  cystocele or rectocele noted Cervix pink and appears healthy, no lesions or ulcerations noted, no discharge noted from os Uterus atrophic, no adnexal masses, no cervical motion or adnexal tenderness.   Musculoskeletal exam: Decreased  ROM of spine, hips , shoulders and knees. No deformity ,swelling or crepitus noted. No muscle  wasting or atrophy.   Neurologic: Cranial nerves 2 to 12 intact. Power, tone ,sensation and reflexes normal throughout. Abnormal gait. No tremor.  Skin: Intact, no ulceration, erythema , scaling or rash noted. Pigmentation normal throughout  Psych; Normal mood and affect. Judgement and concentration normal   Assessment & Plan:  Allergic rhinitis Uncontrolled, add astellin  Annual physical exam Annual exam as documented. . Immunization and cancer screening needs are specifically addressed at this visit.   Need for vaccination with 13-polyvalent pneumococcal conjugate vaccine After obtaining informed consent, the vaccine is  administered by LPN.

## 2016-03-14 NOTE — Assessment & Plan Note (Signed)
After obtaining informed consent, the vaccine is  administered by LPN.  

## 2016-03-14 NOTE — Assessment & Plan Note (Signed)
Annual exam as documented. . Immunization and cancer screening needs are specifically addressed at this visit.  

## 2016-03-19 ENCOUNTER — Ambulatory Visit (HOSPITAL_COMMUNITY): Payer: Medicare Other

## 2016-03-19 ENCOUNTER — Telehealth (HOSPITAL_COMMUNITY): Payer: Self-pay | Admitting: *Deleted

## 2016-03-24 ENCOUNTER — Other Ambulatory Visit: Payer: Self-pay

## 2016-03-24 MED ORDER — CLONAZEPAM 0.5 MG PO TABS: 0.5000 mg | ORAL_TABLET | Freq: Two times a day (BID) | ORAL | 1 refills | Status: DC | PRN

## 2016-03-26 ENCOUNTER — Ambulatory Visit (HOSPITAL_COMMUNITY): Payer: Medicare Other

## 2016-03-29 ENCOUNTER — Other Ambulatory Visit: Payer: Self-pay | Admitting: Family Medicine

## 2016-04-02 ENCOUNTER — Ambulatory Visit (HOSPITAL_COMMUNITY): Payer: Medicare Other

## 2016-04-07 ENCOUNTER — Other Ambulatory Visit: Payer: Self-pay

## 2016-04-07 ENCOUNTER — Emergency Department (HOSPITAL_COMMUNITY): Payer: Medicare Other

## 2016-04-07 ENCOUNTER — Emergency Department (HOSPITAL_COMMUNITY)
Admission: EM | Admit: 2016-04-07 | Discharge: 2016-04-07 | Disposition: A | Discharge status: Home / Self Care | Payer: Medicare Other | Attending: Emergency Medicine | Admitting: Emergency Medicine

## 2016-04-07 ENCOUNTER — Encounter (HOSPITAL_COMMUNITY): Payer: Self-pay | Admitting: Emergency Medicine

## 2016-04-07 DIAGNOSIS — Z79899 Other long term (current) drug therapy: Secondary | ICD-10-CM | POA: Diagnosis not present

## 2016-04-07 DIAGNOSIS — S2232XA Fracture of one rib, left side, initial encounter for closed fracture: Secondary | ICD-10-CM

## 2016-04-07 DIAGNOSIS — F17210 Nicotine dependence, cigarettes, uncomplicated: Secondary | ICD-10-CM | POA: Diagnosis not present

## 2016-04-07 DIAGNOSIS — X58XXXA Exposure to other specified factors, initial encounter: Secondary | ICD-10-CM | POA: Diagnosis not present

## 2016-04-07 DIAGNOSIS — Y939 Activity, unspecified: Secondary | ICD-10-CM | POA: Diagnosis not present

## 2016-04-07 DIAGNOSIS — C259 Malignant neoplasm of pancreas, unspecified: Secondary | ICD-10-CM | POA: Diagnosis not present

## 2016-04-07 DIAGNOSIS — Y929 Unspecified place or not applicable: Secondary | ICD-10-CM | POA: Diagnosis not present

## 2016-04-07 DIAGNOSIS — R079 Chest pain, unspecified: Secondary | ICD-10-CM

## 2016-04-07 DIAGNOSIS — S299XXA Unspecified injury of thorax, initial encounter: Secondary | ICD-10-CM | POA: Diagnosis present

## 2016-04-07 DIAGNOSIS — Y999 Unspecified external cause status: Secondary | ICD-10-CM | POA: Diagnosis not present

## 2016-04-07 DIAGNOSIS — R05 Cough: Secondary | ICD-10-CM | POA: Diagnosis not present

## 2016-04-07 DIAGNOSIS — I10 Essential (primary) hypertension: Secondary | ICD-10-CM | POA: Insufficient documentation

## 2016-04-07 DIAGNOSIS — E876 Hypokalemia: Secondary | ICD-10-CM | POA: Diagnosis not present

## 2016-04-07 DIAGNOSIS — C787 Secondary malignant neoplasm of liver and intrahepatic bile duct: Secondary | ICD-10-CM | POA: Diagnosis not present

## 2016-04-07 LAB — BASIC METABOLIC PANEL
Anion gap: 7 (ref 5–15)
BUN: 18 mg/dL (ref 6–20)
CALCIUM: 8.7 mg/dL — AB (ref 8.9–10.3)
CO2: 27 mmol/L (ref 22–32)
CREATININE: 0.85 mg/dL (ref 0.44–1.00)
Chloride: 109 mmol/L (ref 101–111)
GFR calc Af Amer: >60 mL/min (ref >60)
GLUCOSE: 109 mg/dL — AB (ref 65–99)
Potassium: 3.1 mmol/L — ABNORMAL LOW (ref 3.5–5.1)
Sodium: 143 mmol/L (ref 135–145)

## 2016-04-07 LAB — CBC
HEMATOCRIT: 32.5 % — AB (ref 36.0–46.0)
Hemoglobin: 10.7 g/dL — ABNORMAL LOW (ref 12.0–15.0)
MCH: 29.7 pg (ref 26.0–34.0)
MCHC: 32.9 g/dL (ref 30.0–36.0)
MCV: 90.3 fL (ref 78.0–100.0)
PLATELETS: 170 10*3/uL (ref 150–400)
RBC: 3.6 MIL/uL — ABNORMAL LOW (ref 3.87–5.11)
RDW: 16.2 % — AB (ref 11.5–15.5)
WBC: 8.7 10*3/uL (ref 4.0–10.5)

## 2016-04-07 LAB — I-STAT TROPONIN, ED: TROPONIN I, POC: 0.01 ng/mL (ref 0.00–0.08)

## 2016-04-07 MED ORDER — HYDROCODONE-ACETAMINOPHEN 5-325 MG PO TABS: 1.0000 | ORAL_TABLET | ORAL | 0 refills | Status: DC | PRN

## 2016-04-07 MED ORDER — HEPARIN SOD (PORK) LOCK FLUSH 100 UNIT/ML IV SOLN
INTRAVENOUS | Status: AC
  Administered 2016-04-07: 500 [IU]
  Filled 2016-04-07: qty 5

## 2016-04-07 MED ORDER — FENTANYL CITRATE (PF) 100 MCG/2ML IJ SOLN
50.0000 ug | INTRAMUSCULAR | Status: DC | PRN
  Administered 2016-04-07: 50 ug via INTRAVENOUS
  Filled 2016-04-07 (×2): qty 2

## 2016-04-07 MED ORDER — POTASSIUM CHLORIDE CRYS ER 20 MEQ PO TBCR
40.0000 meq | EXTENDED_RELEASE_TABLET | Freq: Once | ORAL | Status: AC
  Administered 2016-04-07: 40 meq via ORAL
  Filled 2016-04-07: qty 2

## 2016-04-07 NOTE — ED Notes (Signed)
Pt complaining of pain, MD aware orders given. 

## 2016-04-07 NOTE — Discharge Instructions (Signed)
If you were given medicines take as directed.  If you are on coumadin or contraceptives realize their levels and effectiveness is altered by many different medicines.  If you have any reaction (rash, tongues swelling, other) to the medicines stop taking and see a physician.   Take tylenol every 4 hrs for pain. For severe pain take norco or vicodin however realize they have the potential for addiction and it can make you sleepy and has tylenol in it.  No operating machinery while taking.  If your blood pressure was elevated in the ER make sure you follow up for management with a primary doctor or return for chest pain, shortness of breath or stroke symptoms.  Please follow up as directed and return to the ER or see a physician for new or worsening symptoms.  Thank you. Vitals:   04/07/16 1100 04/07/16 1130 04/07/16 1200 04/07/16 1230  BP:   179/75 154/75  Pulse: 81 83 81 82  Resp: 24 26 15  (!) 28  Temp:      TempSrc:      SpO2: 100% 99% 100% 97%  Weight:      Height:

## 2016-04-07 NOTE — ED Notes (Signed)
Pt returned from Ct.

## 2016-04-07 NOTE — ED Provider Notes (Signed)
[facility] DEPT Provider Note   CSN: [id number] Arrival date & time: 04/07/16  [id number]  By signing my name below, I, Higinio Plan, attest that this documentation has been prepared under the direction and in the presence of Elnora Morrison, MD . Electronically Signed: Higinio Plan, Scribe. 04/07/2016. 9:50 AM.  History   Chief Complaint Chief Complaint  Patient presents with  . Chest Pain   The history is provided by the patient and a relative. No language interpreter was used.   HPI Comments: [name] is a 82 y.o. female with PMHx of pancreatic cancer, who presents to the Emergency Department complaining of gradually worsening, left sided rib pain that began 2 days ago. Pt reports her pain is exacerbated with movement and coughing but is not affected when sitting upright and still. She notes associated subjective swelling in her right knee. She states she has not taken any medication for her pain. Per sister, pt was recently told by her oncologist that she "does not have any signs of pancreatic cancer." She notes pt recently received chemotherapy and radiation for one month but is not currently receiving any now. Pt denies fever, chills, hx of PE in lungs or legs and any recent surgeries.    Past Medical History:  Diagnosis Date  . Anemia due to antineoplastic chemotherapy 09/12/2015   Started Aranesp 500 mcg on 09/12/2015  . Anxiety   . Chronic diarrhea   . Depression   . DNR (do not resuscitate) 08/16/2015  . Erosive esophagitis   . GERD (gastroesophageal reflux disease)   . Hx of adenomatous colonic polyps    tubular adenomas, last found in 2008  . Hyperplastic colon polyp 03/19/10   tcs by Dr. Gala Romney  . Hypertension 20 years   . Kidney stone    hx/ crushed   . Opioid contract exists 04/18/2015   With Dr. Moshe Cipro  . Pancreatic cancer ([place]) 02/2014  . Pancreatic cancer metastasized to liver ([place]) 03/10/2014  . Schatzki's ring 08/26/10   Last dilated on EGD by Dr. Trevor Iha [facility],  linear gastric erosions, BI hemigastrectomy    Patient Active Problem List   Diagnosis Date Noted  . Need for vaccination with 13-polyvalent pneumococcal conjugate vaccine 03/10/2016  . At high risk for falls 09/22/2015  . Anemia due to antineoplastic chemotherapy 09/12/2015  . DNR (do not resuscitate) 08/16/2015  . Chronic low back pain with sciatica 06/03/2015  . Hx of adenomatous colonic polyps 04/24/2015  . Opioid contract exists 04/18/2015  . Right knee pain 04/14/2015  . Heme positive stool 12/18/2014  . Allergic rhinitis 12/12/2014  . Pancreatic cancer metastasized to liver ([place]) 03/10/2014  . Abnormal liver ultrasound 02/27/2014  . Anemia, iron deficiency 11/03/2013  . Annual physical exam 10/16/2013  . IGT (impaired glucose tolerance) 02/19/2013  . Back pain with radiation 05/24/2012  . Primary generalized (osteo)arthritis 05/12/2012  . Anxiety 09/15/2011  . GERD (gastroesophageal reflux disease) 08/20/2010  . Essential hypertension 01/07/2010    Past Surgical History:  Procedure Laterality Date  . BREAST LUMPECTOMY Left   . bunion removal     from both feet   . CHOLECYSTECTOMY  1965   . COLONOSCOPY  03/19/2010   DR Gala Romney,, normal TI, pancolonic diverticula, random colon bx neg., hyperplastic polyps removed  . ESOPHAGOGASTRODUODENOSCOPY  11/22/2003   DR Gala Romney, erosive RE, Billroth I  . ESOPHAGOGASTRODUODENOSCOPY  08/26/10   Dr. Gala Romney- moderate severe ERE, Scahtzki ring s/p dilation, Billroth I, linear gastric erosions, bx-gastric xanthelasma  . LEFT  SHOULDER SURGERY  2009   DR HARRISON  . ORIF ANKLE FRACTURE Right 05/22/2013   Procedure: OPEN REDUCTION INTERNAL FIXATION (ORIF) RIGHT ANKLE FRACTURE;  Surgeon: Sanjuana Kava, MD;  Location: [facility];  Service: Orthopedics;  Laterality: Right;  . stomach ulcer  50 years ago    had some of her stomach removed     OB History    No data available       Home Medications    Prior to Admission medications   Medication  Sig Start Date End Date Taking? Authorizing Provider  amLODipine (NORVASC) 10 MG tablet Take 1 tablet (10 mg total) by mouth daily. 10/24/15  Yes Fayrene Helper, MD  azelastine (ASTELIN) 0.1 % nasal spray Place 2 sprays into both nostrils 2 (two) times daily. Use in each nostril as directed 03/10/16  Yes Fayrene Helper, MD  Calcium-Magnesium-Vitamin D (CALCIUM 1200+D3 PO) Take 2 tablets by mouth daily.   Yes Historical Provider, MD  cholestyramine light (PREVALITE) 4 GM/DOSE powder MIX AND TAKE 1 SCOOP BY MOUTH DAILY 04/01/16  Yes Fayrene Helper, MD  clonazePAM (KLONOPIN) 0.5 MG tablet Take 1 tablet (0.5 mg total) by mouth 2 (two) times daily as needed. for anxiety 03/24/16  Yes Fayrene Helper, MD  cycloSPORINE (RESTASIS) 0.05 % ophthalmic emulsion Place 1 drop into both eyes 2 (two) times daily.   Yes Historical Provider, MD  ferrous sulfate 325 (65 FE) MG tablet Take 325 mg by mouth daily with breakfast.   Yes Historical Provider, MD  HYDROcodone-acetaminophen (NORCO/VICODIN) 5-325 MG tablet One tablet twice daily 03/14/16  Yes Fayrene Helper, MD  megestrol (MEGACE) 40 MG/ML suspension Take 200 mg by mouth daily.  03/04/16  Yes Historical Provider, MD  metoprolol tartrate (LOPRESSOR) 25 MG tablet Take 0.5 tablets (12.5 mg total) by mouth 2 (two) times daily. 11/19/15 11/18/16 Yes Fayrene Helper, MD  montelukast (SINGULAIR) 10 MG tablet Take 1 tablet (10 mg total) by mouth at bedtime. 12/12/14  Yes Fayrene Helper, MD  pantoprazole (PROTONIX) 20 MG tablet TAKE 1 TABLET(20 MG) BY MOUTH DAILY 11/20/15  Yes Fayrene Helper, MD  Potassium 99 MG TABS Take 1 tablet by mouth daily.   Yes Historical Provider, MD  predniSONE (DELTASONE) 5 MG tablet TAKE 1 TABLET(5 MG) BY MOUTH DAILY WITH BREAKFAST 02/19/16  Yes Baird Cancer, PA-C  Probiotic Product (HEALTHY COLON PO) Take 1 tablet by mouth every morning.    Yes Historical Provider, MD  HYDROcodone-acetaminophen (NORCO) 5-325 MG  tablet Take 1 tablet by mouth every 4 (four) hours as needed. 04/07/16   Elnora Morrison, MD  lidocaine-prilocaine (EMLA) cream APPLY A QUARTER SIZE AMOUNT TO PORT SITE 1 HOURS PRIOR TO CHEMO DO NOT RUB IN COVER WITH PLASTIC WRAP Patient not taking: Reported on 03/10/2016 09/10/15   Patrici Ranks, MD    Family History Family History  Problem Relation Age of Onset  . Hypertension Mother   . Kidney failure Brother     X1 ON DIALYSIS  . Diabetes Sister   . Cancer Sister     X2  . Hypertension Father   . Liver disease Neg Hx   . Colon cancer Neg Hx     Social History Social History  Substance Use Topics  . Smoking status: Current Every Day Smoker    Types: Cigarettes    Last attempt to quit: 11/09/2011  . Smokeless tobacco: Never Used     Comment: quit a few weeks ago   .  Alcohol use No     Allergies   Iohexol; Aciphex [rabeprazole sodium]; Amlodipine besylate-valsartan; Esomeprazole magnesium; Omeprazole; Penicillins; and Ciprofloxacin  Review of Systems Review of Systems  Constitutional: Negative for chills and fever.  Respiratory: Positive for cough.   Musculoskeletal: Positive for arthralgias (left ribs) and joint swelling (right knee).   Physical Exam Updated Vital Signs BP 165/78 (BP Location: Right Arm)   Pulse 83   Temp 98.7 F (37.1 C) (Oral)   Resp 20   Ht 5\' 3"  (1.6 m)   Wt 137 lb (62.1 kg)   SpO2 98%   BMI 24.27 kg/m   Physical Exam  Constitutional: She is oriented to person, place, and time. She appears well-developed and well-nourished.  HENT:  Head: Normocephalic.  Eyes: EOM are normal.  Neck: Normal range of motion.  Pulmonary/Chest: Effort normal. She has wheezes.  No increase effort with taking a breath; however, pt has pain in left rib area when taking a breath. Expiratory wheezes. Tenderness in lower anterior ribs with palpation.   Abdominal: She exhibits no distension.  Musculoskeletal: Normal range of motion.  No edema in legs, no calf  tenderness.  Neurological: She is alert and oriented to person, place, and time.  Psychiatric: She has a normal mood and affect.  Nursing note and vitals reviewed.  ED Treatments / Results  Labs (all labs ordered are listed, but only abnormal results are displayed) Labs Reviewed  BASIC METABOLIC PANEL - Abnormal; Notable for the following:       Result Value   Potassium 3.1 (*)    Glucose, Bld 109 (*)    Calcium 8.7 (*)    All other components within normal limits  CBC - Abnormal; Notable for the following:    RBC 3.60 (*)    Hemoglobin 10.7 (*)    HCT 32.5 (*)    RDW 16.2 (*)    All other components within normal limits  I-STAT TROPOININ, ED    EKG  EKG Interpretation None       Radiology Dg Chest 2 View  Result Date: 04/07/2016 CLINICAL DATA:  Chest pain and cough for 3 days. History of pancreatic carcinoma EXAM: CHEST  2 VIEW COMPARISON:  June 05, 2014 chest radiograph; chest CT January 23, 2016 FINDINGS: There is no edema or consolidation. Heart is upper normal in size with pulmonary vascularity within normal limits. No adenopathy. Port-A-Cath tip is in the superior vena cava. No pneumothorax. There is atherosclerotic calcification in the aorta. No bone lesions. IMPRESSION: Aortic atherosclerosis.  No edema or consolidation.  No adenopathy. Electronically Signed   By: Lowella Grip III M.D.   On: 04/07/2016 10:14   Ct Chest Wo Contrast  Result Date: 04/07/2016 CLINICAL DATA:  82 year old female with cough and left lower anterior chest pain for 2 days. Pancreatic cancer metastatic to liver. Subsequent encounter. EXAM: CT CHEST WITHOUT CONTRAST TECHNIQUE: Multidetector CT imaging of the chest was performed following the standard protocol without IV contrast. COMPARISON:  04/07/2016 chest x-ray. 01/23/2016 chest CT. 12/21/2015 MR abdomen. FINDINGS: Cardiovascular: Prominent coronary artery calcification. Heart size normal. Calcification and mild ectasia thoracic  aorta. Calcified plaque with narrowing great vessels most notable involving the left common carotid artery. Right central line tip distal superior vena cava. Mediastinum/Nodes: Thyroid lesions stable. Calcified pretracheal lymph nodes unremarkable. Noncalcified sub carinal adenopathy has progressed since prior exam. Lungs/Pleura: Biapical parenchymal scarring. Moderate centrilobular/paraseptal emphysema. 2 mm peripheral left lower lobe nodule unchanged. Upper Abdomen: Bilateral renal lesions some of  which appear to be complex cysts as noted on August 2017 MR unchanged from prior CT. Musculoskeletal: New anterior left sixth rib fracture. Degenerative changes lower cervical spine and throughout the thoracic spine. Mild thoracic kyphosis and scoliosis convex right. IMPRESSION: When compared to recent chest CT, new minimally displaced anterior left sixth rib fracture. Slight increase in subcarinal adenopathy with remainder of findings without change as detailed above. Electronically Signed   By: Genia Del M.D.   On: 04/07/2016 13:02    Procedures Procedures (including critical care time)  Medications Ordered in ED Medications  fentaNYL (SUBLIMAZE) injection 50 mcg (50 mcg Intravenous Given 04/07/16 1125)  potassium chloride SA (K-DUR,KLOR-CON) CR tablet 40 mEq (not administered)    DIAGNOSTIC STUDIES:  Oxygen Saturation is 98% on RA, normal by my interpretation.    COORDINATION OF CARE:  9:42 AM Discussed treatment plan with pt at bedside and pt agreed to plan.  Initial Impression / Assessment and Plan / ED Course  I have reviewed the triage vital signs and the nursing notes.  Pertinent labs & imaging results that were available during my care of the patient were reviewed by me and considered in my medical decision making (see chart for details).  Clinical Course    Patient presents with left-sided chest pain worse with movement palpation. Concern clinically for muscle skeletal/cancer  related. Chest x-ray no acute findings. CT chest revealed isolated rib fracture which extends her pain. Patient needs pain meds and outpatient follow.  Results and differential diagnosis were discussed with the patient/parent/guardian. Xrays were independently reviewed by myself.  Close follow up outpatient was discussed, comfortable with the plan.   Medications  fentaNYL (SUBLIMAZE) injection 50 mcg (50 mcg Intravenous Given 04/07/16 1125)  potassium chloride SA (K-DUR,KLOR-CON) CR tablet 40 mEq (not administered)    Vitals:   04/07/16 1100 04/07/16 1130 04/07/16 1200 04/07/16 1230  BP:   179/75 154/75  Pulse: 81 83 81 82  Resp: 24 26 15  (!) 28  Temp:      TempSrc:      SpO2: 100% 99% 100% 97%  Weight:      Height:        Final diagnoses:  Closed fracture of one rib of left side, initial encounter  Left sided chest pain  Hypokalemia   Final Clinical Impressions(s) / ED Diagnoses   Final diagnoses:  Closed fracture of one rib of left side, initial encounter  Left sided chest pain    New Prescriptions New Prescriptions   HYDROCODONE-ACETAMINOPHEN (NORCO) 5-325 MG TABLET    Take 1 tablet by mouth every 4 (four) hours as needed.     Elnora Morrison, MD 04/07/16 [phone]

## 2016-04-07 NOTE — ED Notes (Signed)
MD at the bedside  

## 2016-04-07 NOTE — ED Triage Notes (Signed)
Patient complaining of pain under left breast x 3 days. Also complaining of cough since last night.

## 2016-04-07 NOTE — ED Notes (Addendum)
Dr. Oneida Alar paged to [phone]. ERROR. DISCUSSED WITH DR. Reather Converse. CANCEL CONSULT.

## 2016-04-09 ENCOUNTER — Ambulatory Visit (HOSPITAL_COMMUNITY): Payer: Medicare Other

## 2016-04-09 DIAGNOSIS — H26491 Other secondary cataract, right eye: Secondary | ICD-10-CM | POA: Diagnosis not present

## 2016-04-09 DIAGNOSIS — H5310 Unspecified subjective visual disturbances: Secondary | ICD-10-CM | POA: Diagnosis not present

## 2016-04-09 DIAGNOSIS — H26492 Other secondary cataract, left eye: Secondary | ICD-10-CM | POA: Diagnosis not present

## 2016-04-10 ENCOUNTER — Ambulatory Visit (HOSPITAL_COMMUNITY)
Admission: RE | Admit: 2016-04-10 | Discharge: 2016-04-10 | Disposition: A | Discharge status: Home / Self Care | Payer: Medicare Other | Source: Ambulatory Visit | Attending: Oncology | Admitting: Oncology

## 2016-04-10 ENCOUNTER — Encounter (HOSPITAL_COMMUNITY): Payer: Medicare Other

## 2016-04-10 ENCOUNTER — Encounter (HOSPITAL_COMMUNITY): Payer: Medicare Other | Attending: Oncology | Admitting: Oncology

## 2016-04-10 VITALS — BP 143/67 | HR 78 | Temp 97.8°F | Resp 20 | Wt 136.1 lb

## 2016-04-10 DIAGNOSIS — Z9221 Personal history of antineoplastic chemotherapy: Secondary | ICD-10-CM | POA: Diagnosis not present

## 2016-04-10 DIAGNOSIS — F329 Major depressive disorder, single episode, unspecified: Secondary | ICD-10-CM | POA: Insufficient documentation

## 2016-04-10 DIAGNOSIS — C787 Secondary malignant neoplasm of liver and intrahepatic bile duct: Secondary | ICD-10-CM | POA: Diagnosis not present

## 2016-04-10 DIAGNOSIS — Z9889 Other specified postprocedural states: Secondary | ICD-10-CM | POA: Diagnosis not present

## 2016-04-10 DIAGNOSIS — Z8507 Personal history of malignant neoplasm of pancreas: Secondary | ICD-10-CM | POA: Diagnosis not present

## 2016-04-10 DIAGNOSIS — Z780 Asymptomatic menopausal state: Secondary | ICD-10-CM

## 2016-04-10 DIAGNOSIS — Z8249 Family history of ischemic heart disease and other diseases of the circulatory system: Secondary | ICD-10-CM | POA: Insufficient documentation

## 2016-04-10 DIAGNOSIS — Z9049 Acquired absence of other specified parts of digestive tract: Secondary | ICD-10-CM | POA: Insufficient documentation

## 2016-04-10 DIAGNOSIS — F419 Anxiety disorder, unspecified: Secondary | ICD-10-CM | POA: Diagnosis not present

## 2016-04-10 DIAGNOSIS — Z79899 Other long term (current) drug therapy: Secondary | ICD-10-CM | POA: Diagnosis not present

## 2016-04-10 DIAGNOSIS — S2232XA Fracture of one rib, left side, initial encounter for closed fracture: Secondary | ICD-10-CM | POA: Diagnosis not present

## 2016-04-10 DIAGNOSIS — I10 Essential (primary) hypertension: Secondary | ICD-10-CM | POA: Insufficient documentation

## 2016-04-10 DIAGNOSIS — C259 Malignant neoplasm of pancreas, unspecified: Secondary | ICD-10-CM

## 2016-04-10 DIAGNOSIS — R590 Localized enlarged lymph nodes: Secondary | ICD-10-CM | POA: Insufficient documentation

## 2016-04-10 DIAGNOSIS — F17210 Nicotine dependence, cigarettes, uncomplicated: Secondary | ICD-10-CM | POA: Diagnosis not present

## 2016-04-10 DIAGNOSIS — Z833 Family history of diabetes mellitus: Secondary | ICD-10-CM | POA: Diagnosis not present

## 2016-04-10 DIAGNOSIS — Z800 Family history of malignant neoplasm of digestive organs: Secondary | ICD-10-CM | POA: Diagnosis not present

## 2016-04-10 DIAGNOSIS — R05 Cough: Secondary | ICD-10-CM | POA: Diagnosis not present

## 2016-04-10 DIAGNOSIS — R062 Wheezing: Secondary | ICD-10-CM | POA: Insufficient documentation

## 2016-04-10 DIAGNOSIS — Z87442 Personal history of urinary calculi: Secondary | ICD-10-CM | POA: Insufficient documentation

## 2016-04-10 DIAGNOSIS — K219 Gastro-esophageal reflux disease without esophagitis: Secondary | ICD-10-CM | POA: Diagnosis not present

## 2016-04-10 DIAGNOSIS — Z66 Do not resuscitate: Secondary | ICD-10-CM | POA: Diagnosis not present

## 2016-04-10 DIAGNOSIS — R059 Cough, unspecified: Secondary | ICD-10-CM

## 2016-04-10 LAB — CBC WITH DIFFERENTIAL/PLATELET
BASOS ABS: 0 10*3/uL (ref 0.0–0.1)
Basophils Relative: 0 %
Eosinophils Absolute: 0.2 10*3/uL (ref 0.0–0.7)
Eosinophils Relative: 2 %
HEMATOCRIT: 35.1 % — AB (ref 36.0–46.0)
HEMOGLOBIN: 11.4 g/dL — AB (ref 12.0–15.0)
LYMPHS PCT: 21 %
Lymphs Abs: 2.1 10*3/uL (ref 0.7–4.0)
MCH: 29.5 pg (ref 26.0–34.0)
MCHC: 32.5 g/dL (ref 30.0–36.0)
MCV: 90.7 fL (ref 78.0–100.0)
MONOS PCT: 5 %
Monocytes Absolute: 0.5 10*3/uL (ref 0.1–1.0)
NEUTROS ABS: 7.2 10*3/uL (ref 1.7–7.7)
Neutrophils Relative %: 72 %
Platelets: 193 10*3/uL (ref 150–400)
RBC: 3.87 MIL/uL (ref 3.87–5.11)
RDW: 16 % — ABNORMAL HIGH (ref 11.5–15.5)
WBC: 10 10*3/uL (ref 4.0–10.5)

## 2016-04-10 LAB — COMPREHENSIVE METABOLIC PANEL
ALBUMIN: 3.9 g/dL (ref 3.5–5.0)
ALK PHOS: 75 U/L (ref 38–126)
ALT: 22 U/L (ref 14–54)
AST: 26 U/L (ref 15–41)
Anion gap: 10 (ref 5–15)
BILIRUBIN TOTAL: 0.9 mg/dL (ref 0.3–1.2)
BUN: 23 mg/dL — AB (ref 6–20)
CALCIUM: 8.9 mg/dL (ref 8.9–10.3)
CO2: 21 mmol/L — ABNORMAL LOW (ref 22–32)
Chloride: 108 mmol/L (ref 101–111)
Creatinine, Ser: 1.05 mg/dL — ABNORMAL HIGH (ref 0.44–1.00)
GFR calc Af Amer: 55 mL/min — ABNORMAL LOW (ref >60)
GFR calc non Af Amer: 47 mL/min — ABNORMAL LOW (ref >60)
GLUCOSE: 161 mg/dL — AB (ref 65–99)
Potassium: 3.9 mmol/L (ref 3.5–5.1)
Sodium: 139 mmol/L (ref 135–145)
TOTAL PROTEIN: 7.2 g/dL (ref 6.5–8.1)

## 2016-04-10 MED ORDER — PREDNISONE 10 MG (21) PO TBPK: 10.0000 mg | ORAL_TABLET | Freq: Every day | ORAL | 0 refills | Status: DC

## 2016-04-10 MED ORDER — AZITHROMYCIN 250 MG PO TABS: ORAL_TABLET | ORAL | 0 refills | Status: DC

## 2016-04-10 NOTE — Patient Instructions (Addendum)
[facility] at [facility] Discharge Instructions  RECOMMENDATIONS MADE BY THE CONSULTANT AND ANY TEST RESULTS WILL BE SENT TO YOUR REFERRING PHYSICIAN.  Chest Xray today - will call in an antibiotic & steroid based on chest xray  Bone Density exam in 2-4 weeks  MRI abdomen in 4 weeks  Return in 5 weeks with Dr. Whitney Muse for follow up with labs     Thank you for choosing [facility] at [facility] to provide your oncology and hematology care.  To afford each patient quality time with our provider, please arrive at least 15 minutes before your scheduled appointment time.   Beginning January 23rd 2017 lab work for the [name] will be done in the  Main lab at [facility] on 1st floor. If you have a lab appointment with the [facility] please come in thru the  Main Entrance and check in at the main information desk  You need to re-schedule your appointment should you arrive 10 or more minutes late.  We strive to give you quality time with our providers, and arriving late affects you and other patients whose appointments are after yours.  Also, if you no show three or more times for appointments you may be dismissed from the clinic at the providers discretion.     Again, thank you for choosing [facility].  Our hope is that these requests will decrease the amount of time that you wait before being seen by our physicians.       _____________________________________________________________  Should you have questions after your visit to [facility], please contact our office at [phone] between the hours of 8:30 a.m. and 4:30 p.m.  Voicemails left after 4:30 p.m. will not be returned until the following business day.  For prescription refill requests, have your pharmacy contact our office.         Resources For Cancer Patients and their Caregivers ? American Cancer Society: Can assist with transportation,  wigs, general needs, runs Look Good Feel Better.        4804611029 ? Cancer Care: Provides financial assistance, online support groups, medication/co-pay assistance.  1-800-813-HOPE 320-405-0024) ? [place] Assists [place] cancer patients and their families through emotional , educational and financial support.  856-850-7618 ? [place] DSS Where to apply for food stamps, Medicaid and utility assistance. 442 064 7034 ? [facility]: Transportation to medical appointments. 952-513-8490 ? Social Security Administration: May apply for disability if have a Stage IV cancer. 469 594 0824 (915)773-3039 ? [employer], Disability and Transit Services: Assists with nutrition, care and transit needs. [place] Support Programs: @10RELATIVEDAYS @ > Cancer Support Group  2nd Tuesday of the month 1pm-2pm, Journey Room  > Creative Journey  3rd Tuesday of the month 1130am-1pm, Journey Room  > Look Good Feel Better  1st Wednesday of the month 10am-12 noon, Journey Room (Call [place] to register [phone])

## 2016-04-10 NOTE — Assessment & Plan Note (Addendum)
Stage IV Pancreatic Cancer, on chemotherapy holiday.  Oncology history is updated.   Patient has entered into a pain contract with her primary care provider for non-oncologic pain.  She has been followed by Dr. Luna Glasgow (Ortho) for her knee and ankle pain.    Labs today: CBC diff, CMET, CA19-9.  I personally reviewed and went over laboratory results with the patient.  The results are noted within this dictation.  Will repeat labs in 4-6 weeks: CBC diff, CMET, CA 19-9.  DNR  She recently reported to the ED on 04/07/2016 for left chest pain and diagnosed with a left rib fracture.  Patient denies any falls or trauma.  I have ordered a bone density to evaluate for osteoporosis.  She is noted to have rhonchi with nonproductive cough.  I will order a Chest Xray.  I suspect she may have a viral respiratory infection.  Based upon imaging, I will decide about antibiotic need +/- corticosteroids.  Order is placed for MRI abdomen for restaging prior to her next follow-up appointment.  Return in 4-6 weeks for follow-up.   Addendum: Chest xray is unimpressive.  I have escribed Z-PAK and Pred-Pak to her pharmacy.

## 2016-04-10 NOTE — Progress Notes (Signed)
[name] Nakayama, MD [address] [place] [address]  Pancreatic cancer metastasized to liver [facility]) - Plan: MR Abdomen W Wo Contrast, CBC with Differential, Comprehensive metabolic panel, Cancer antigen 19-9  Wheezing - Plan: DG Chest 1 View  Cough - Plan: DG Chest 1 View  Post-menopausal - Plan: DG Bone Density  Closed fracture of one rib of left side, initial encounter - Plan: DG Bone Density  CURRENT THERAPY: Chemotherapy HOLIDAY  INTERVAL HISTORY: [name] 82 y.o. female returns for followup of Stage IV pancreatic cancer.      Pancreatic cancer metastasized to liver ([place])   03/10/2014 Initial Diagnosis    Pancreatic cancer metastasized to liver      03/22/2014 - 03/05/2016 Chemotherapy    Abraxane/Gemzar days 1, 8, every 28 days.  Day 15 was cancelled due to leukopenia and thrombocytopenia on day 15 cycle 1.      05/24/2014 Treatment Plan Change    Day 8 of cycle 3 is held with ANC of 1.1      06/05/2014 Imaging    CT C/A/P . Interval decrease in size of the pancreatic tail mass.Improved hepatic metastatic disease. No new lesions. No CT findings for metastatic disease involving the chest.      08/09/2014 Tumor Marker    CA 19-9= 33 (WNL)      10/26/2014 Imaging    MRI- Continued interval decrease in size of the hepatic metastatic lesions and no new lesions are identified. Continued decrease in size of the pancreatic tail lesion.      01/03/2015 Tumor Marker    Results for MARVELINE, KLOSTERMANN (MRN [id number]) as of 01/18/2015 12:52  01/03/2015 10:00 CA 19-9: 14       01/17/2015 Imaging    MRI- Response to therapy of hepatic metastasis.  Similar size of a pancreatic tail lesion.      03/20/2015 Imaging    MRI-L spine- Severe disc space narrowing at L2-L3, with endplate reactive changes. Large disc extrusion into the ventral epidural space,central to the RIGHT with a cephalad migrated free fragment. Additional Large disc extrusion into the  retroperitone...      03/22/2015 Imaging    CT pelvis- No evidence for metastatic disease within the pelvis.      07/24/2015 Imaging    MRI abd- Continued response to therapy, with no residual detectable liver metastases. No new sites of metastatic disease in the abdomen.       08/15/2015 Code Status     She confirms desire for DNR status.      12/21/2015 Imaging    MRI liver- Severe image degradation due to motion artifact reducing diagnostic sensitivity and specificity. Reduce conspicuity of the pancreatic tail lesion suggesting further improvement. The original liver lesions have resolved.      03/05/2016 Treatment Plan Change    Chemotherapy holiday/Break after 2 years worth of treatment      04/07/2016 Imaging    CT chest- When compared to recent chest CT, new minimally displaced anterior left sixth rib fracture. Slight increase in subcarinal adenopathy       She notes a sudden onset of left sided chest pain.  She presented to the ED where she was diagnosed with a left anterior #6 rib fracture.  She notes that it is tender.  She denies any trauma or fall.  She does not know how she fractured her rib.  She has developed a wheeze.  Her O2 sats are 100%  when resting, but sat does drop with ambulation.  She denies any fevers or chills.  She reports a cough that is nonproductive.  She feels well and does not feel like she has a cold.  She looks and feels great since being off chemotherapy.  She does note some fatigue, but this is slowly improving.  She is now back to going to church on Sundays.  She was not doing that towards the end of her treatment.  Review of Systems  Constitutional: Positive for malaise/fatigue. Negative for chills, fever and weight loss.  HENT: Negative.   Eyes: Negative.  Negative for blurred vision.  Respiratory: Positive for cough and wheezing. Negative for hemoptysis and sputum production.   Cardiovascular: Negative.  Negative for chest pain and leg  swelling.  Gastrointestinal: Negative.  Negative for constipation, diarrhea, nausea and vomiting.  Genitourinary: Negative.   Musculoskeletal: Negative.   Skin: Negative.  Negative for rash.  Neurological: Negative.   Endo/Heme/Allergies: Negative.   Psychiatric/Behavioral: Negative.     Past Medical History:  Diagnosis Date  . Anemia due to antineoplastic chemotherapy 09/12/2015   Started Aranesp 500 mcg on 09/12/2015  . Anxiety   . Chronic diarrhea   . Depression   . DNR (do not resuscitate) 08/16/2015  . Erosive esophagitis   . GERD (gastroesophageal reflux disease)   . Hx of adenomatous colonic polyps    tubular adenomas, last found in 2008  . Hyperplastic colon polyp 03/19/10   tcs by Dr. Gala Romney  . Hypertension 20 years   . Kidney stone    hx/ crushed   . Opioid contract exists 04/18/2015   With Dr. Moshe Cipro  . Pancreatic cancer ([place]) 02/2014  . Pancreatic cancer metastasized to liver ([place]) 03/10/2014  . Schatzki's ring 08/26/10   Last dilated on EGD by Dr. Trevor Iha [facility], linear gastric erosions, BI hemigastrectomy    Past Surgical History:  Procedure Laterality Date  . BREAST LUMPECTOMY Left   . bunion removal     from both feet   . CHOLECYSTECTOMY  1965   . COLONOSCOPY  03/19/2010   DR Gala Romney,, normal TI, pancolonic diverticula, random colon bx neg., hyperplastic polyps removed  . ESOPHAGOGASTRODUODENOSCOPY  11/22/2003   DR Gala Romney, erosive RE, Billroth I  . ESOPHAGOGASTRODUODENOSCOPY  08/26/10   Dr. Gala Romney- moderate severe ERE, Scahtzki ring s/p dilation, Billroth I, linear gastric erosions, bx-gastric xanthelasma  . LEFT SHOULDER SURGERY  2009   DR HARRISON  . ORIF ANKLE FRACTURE Right 05/22/2013   Procedure: OPEN REDUCTION INTERNAL FIXATION (ORIF) RIGHT ANKLE FRACTURE;  Surgeon: Sanjuana Kava, MD;  Location: [facility];  Service: Orthopedics;  Laterality: Right;  . stomach ulcer  50 years ago    had some of her stomach removed     Family History  Problem Relation Age of  Onset  . Hypertension Mother   . Kidney failure Brother     X1 ON DIALYSIS  . Diabetes Sister   . Cancer Sister     X2  . Hypertension Father   . Liver disease Neg Hx   . Colon cancer Neg Hx     Social History   Social History  . Marital status: Widowed    Spouse name: N/A  . Number of children: 2  . Years of education: N/A   Occupational History  . retired from Teacher, adult education Retired   Social History Main Topics  . Smoking status: Current Every Day Smoker    Types: Cigarettes    Last attempt to  quit: 11/09/2011  . Smokeless tobacco: Never Used     Comment: quit a few weeks ago   . Alcohol use No  . Drug use: No  . Sexual activity: No   Other Topics Concern  . Not on file   Social History Narrative  . No narrative on file     PHYSICAL EXAMINATION  ECOG PERFORMANCE STATUS: 1 - Symptomatic but completely ambulatory  Vitals:   04/10/16 1048  BP: (!) 143/67  Pulse: 78  Resp: 20  Temp: 97.8 F (36.6 C)    GENERAL:alert, no distress, comfortable, cooperative, smiling and accompanied by son, hoarse voice, audible rhonchi, cough noted. SKIN: skin color, texture, turgor are normal, no rashes or significant lesions HEAD: Normocephalic, No masses, lesions, tenderness or abnormalities EYES: normal, EOMI, Conjunctiva are pink and non-injected EARS: External ears normal OROPHARYNX:lips, buccal mucosa, and tongue normal and edentulous  NECK: supple, trachea midline LYMPH:  no palpable lymphadenopathy BREAST:not examined LUNGS: decreased breath sounds with minimal rhonchi in upper lobes bilaterally. HEART: regular rate & rhythm, no murmurs and no gallops ABDOMEN:abdomen soft and normal bowel sounds BACK: Back symmetric, no curvature. EXTREMITIES:less then 2 second capillary refill, no joint deformities, effusion, or inflammation, no edema, no skin discoloration, no clubbing, no cyanosis  NEURO: alert & oriented x 3 with fluent speech, no focal motor/sensory  deficits   LABORATORY DATA: CBC    Component Value Date/Time   WBC 10.0 04/10/2016 1010   RBC 3.87 04/10/2016 1010   HGB 11.4 (L) 04/10/2016 1010   HCT 35.1 (L) 04/10/2016 1010   PLT 193 04/10/2016 1010   MCV 90.7 04/10/2016 1010   MCH 29.5 04/10/2016 1010   MCHC 32.5 04/10/2016 1010   RDW 16.0 (H) 04/10/2016 1010   LYMPHSABS 2.1 04/10/2016 1010   MONOABS 0.5 04/10/2016 1010   EOSABS 0.2 04/10/2016 1010   BASOSABS 0.0 04/10/2016 1010      Chemistry      Component Value Date/Time   NA 143 04/07/2016 1016   K 3.1 (L) 04/07/2016 1016   CL 109 04/07/2016 1016   CO2 27 04/07/2016 1016   BUN 18 04/07/2016 1016   CREATININE 0.85 04/07/2016 1016   CREATININE 0.97 02/14/2014 0758      Component Value Date/Time   CALCIUM 8.7 (L) 04/07/2016 1016   ALKPHOS 58 03/05/2016 1040   AST 17 03/05/2016 1040   ALT 15 03/05/2016 1040   BILITOT 0.9 03/05/2016 1040       PENDING LABS:   RADIOGRAPHIC STUDIES:  Dg Chest 1 View  Result Date: 04/10/2016 CLINICAL DATA:  Cough, wheezing EXAM: CHEST 1 VIEW COMPARISON:  04/07/2016 FINDINGS: Cardiomediastinal silhouette is stable. No infiltrate or pleural effusion. No pulmonary edema. Right IJ Port-A-Cath with tip in SVC. No pneumothorax. IMPRESSION: No active disease. Electronically Signed   By: Lahoma Crocker M.D.   On: 04/10/2016 13:47   Dg Chest 2 View  Result Date: 04/07/2016 CLINICAL DATA:  Chest pain and cough for 3 days. History of pancreatic carcinoma EXAM: CHEST  2 VIEW COMPARISON:  June 05, 2014 chest radiograph; chest CT January 23, 2016 FINDINGS: There is no edema or consolidation. Heart is upper normal in size with pulmonary vascularity within normal limits. No adenopathy. Port-A-Cath tip is in the superior vena cava. No pneumothorax. There is atherosclerotic calcification in the aorta. No bone lesions. IMPRESSION: Aortic atherosclerosis.  No edema or consolidation.  No adenopathy. Electronically Signed   By: Lowella Grip  III M.D.   On:  04/07/2016 10:14   Ct Chest Wo Contrast  Result Date: 04/07/2016 CLINICAL DATA:  82 year old female with cough and left lower anterior chest pain for 2 days. Pancreatic cancer metastatic to liver. Subsequent encounter. EXAM: CT CHEST WITHOUT CONTRAST TECHNIQUE: Multidetector CT imaging of the chest was performed following the standard protocol without IV contrast. COMPARISON:  04/07/2016 chest x-ray. 01/23/2016 chest CT. 12/21/2015 MR abdomen. FINDINGS: Cardiovascular: Prominent coronary artery calcification. Heart size normal. Calcification and mild ectasia thoracic aorta. Calcified plaque with narrowing great vessels most notable involving the left common carotid artery. Right central line tip distal superior vena cava. Mediastinum/Nodes: Thyroid lesions stable. Calcified pretracheal lymph nodes unremarkable. Noncalcified sub carinal adenopathy has progressed since prior exam. Lungs/Pleura: Biapical parenchymal scarring. Moderate centrilobular/paraseptal emphysema. 2 mm peripheral left lower lobe nodule unchanged. Upper Abdomen: Bilateral renal lesions some of which appear to be complex cysts as noted on August 2017 MR unchanged from prior CT. Musculoskeletal: New anterior left sixth rib fracture. Degenerative changes lower cervical spine and throughout the thoracic spine. Mild thoracic kyphosis and scoliosis convex right. IMPRESSION: When compared to recent chest CT, new minimally displaced anterior left sixth rib fracture. Slight increase in subcarinal adenopathy with remainder of findings without change as detailed above. Electronically Signed   By: Genia Del M.D.   On: 04/07/2016 13:02     PATHOLOGY:    ASSESSMENT AND PLAN:  Pancreatic cancer metastasized to liver Stage IV Pancreatic Cancer, on chemotherapy holiday.  Oncology history is updated.   Patient has entered into a pain contract with her primary care provider for non-oncologic pain.  She has been followed by Dr.  Luna Glasgow (Ortho) for her knee and ankle pain.    Labs today: CBC diff, CMET, CA19-9.  I personally reviewed and went over laboratory results with the patient.  The results are noted within this dictation.  Will repeat labs in 4-6 weeks: CBC diff, CMET, CA 19-9.  DNR  She recently reported to the ED on 04/07/2016 for left chest pain and diagnosed with a left rib fracture.  Patient denies any falls or trauma.  I have ordered a bone density to evaluate for osteoporosis.  She is noted to have rhonchi with nonproductive cough.  I will order a Chest Xray.  I suspect she may have a viral respiratory infection.  Based upon imaging, I will decide about antibiotic need +/- corticosteroids.  Order is placed for MRI abdomen for restaging prior to her next follow-up appointment.  Return in 4-6 weeks for follow-up.   Addendum: Chest xray is unimpressive.  I have escribed Z-PAK and Pred-Pak to her pharmacy.   ORDERS PLACED FOR THIS ENCOUNTER: Orders Placed This Encounter  Procedures  . DG Chest 1 View  . DG Bone Density  . MR Abdomen W Wo Contrast  . DG Bone Density  . CBC with Differential  . Comprehensive metabolic panel  . Cancer antigen 19-9    MEDICATIONS PRESCRIBED THIS ENCOUNTER: Meds ordered this encounter  Medications  . azithromycin (ZITHROMAX) 250 MG tablet    Sig: Take as directed    Dispense:  6 each    Refill:  0    Order Specific Question:   Supervising Provider    Answer:   Patrici Ranks [id number]  . predniSONE (STERAPRED UNI-PAK 21 TAB) 10 MG (21) TBPK tablet    Sig: Take 1 tablet (10 mg total) by mouth daily. Take 6 tabs PO x 1 day, then 5, 4, 3, 2, 1  Dispense:  21 tablet    Refill:  0    Order Specific Question:   Supervising Provider    Answer:   Patrici Ranks [id number]    THERAPY PLAN:  Chemotherapy HOLIDAY.  Future restaging tests ordered.  All questions were answered. The patient knows to call the clinic with any problems, questions or concerns.  We can certainly see the patient much sooner if necessary.  Patient and plan discussed with Dr. Ancil Linsey and she is in agreement with the aforementioned.   This note is electronically signed by: Doy Mince 04/10/2016 4:58 PM

## 2016-04-11 LAB — CANCER ANTIGEN 19-9: CA 19 9: 44 U/mL — AB (ref 0–35)

## 2016-04-17 ENCOUNTER — Other Ambulatory Visit: Payer: Self-pay

## 2016-04-17 MED ORDER — HYDROCODONE-ACETAMINOPHEN 5-325 MG PO TABS: ORAL_TABLET | ORAL | 0 refills | Status: DC

## 2016-04-18 ENCOUNTER — Ambulatory Visit (INDEPENDENT_AMBULATORY_CARE_PROVIDER_SITE_OTHER): Payer: Medicare Other | Admitting: Family Medicine

## 2016-04-18 ENCOUNTER — Encounter: Payer: Self-pay | Admitting: Family Medicine

## 2016-04-18 ENCOUNTER — Telehealth: Payer: Self-pay | Admitting: Family Medicine

## 2016-04-18 VITALS — BP 120/64 | HR 62 | Temp 97.7°F | Resp 18 | Ht 63.0 in | Wt 138.0 lb

## 2016-04-18 DIAGNOSIS — R350 Frequency of micturition: Secondary | ICD-10-CM

## 2016-04-18 DIAGNOSIS — G8929 Other chronic pain: Secondary | ICD-10-CM

## 2016-04-18 DIAGNOSIS — M545 Low back pain: Secondary | ICD-10-CM | POA: Diagnosis not present

## 2016-04-18 DIAGNOSIS — S2241XD Multiple fractures of ribs, right side, subsequent encounter for fracture with routine healing: Secondary | ICD-10-CM | POA: Diagnosis not present

## 2016-04-18 LAB — POCT URINALYSIS DIPSTICK
Bilirubin, UA: NEGATIVE
Glucose, UA: NEGATIVE
KETONES UA: NEGATIVE
Nitrite, UA: NEGATIVE
PH UA: 6
PROTEIN UA: 100
SPEC GRAV UA: 1.015
UROBILINOGEN UA: 0.2

## 2016-04-18 MED ORDER — SULFAMETHOXAZOLE-TRIMETHOPRIM 800-160 MG PO TABS: 1.0000 | ORAL_TABLET | Freq: Two times a day (BID) | ORAL | 0 refills | Status: AC

## 2016-04-18 MED ORDER — HYDROCODONE-ACETAMINOPHEN 5-325 MG PO TABS: 1.0000 | ORAL_TABLET | Freq: Four times a day (QID) | ORAL | 0 refills | Status: DC | PRN

## 2016-04-18 NOTE — Telephone Encounter (Signed)
[name] left a voicemail this morning asking to be seen possibly for Rib and lower back pain and uncontrolled urine frequency, please advise?

## 2016-04-18 NOTE — Progress Notes (Signed)
Chief Complaint  Patient presents with  . Urinary Tract Infection   Here for acute visit Has a couple of issues First is worried about a UTI with urine frequency and mild dysuria.  No flank pain or fevers Second has a broken rib and has increased pain.  Is on hydrocodone 2x a day.  We discussed rib Fx and pneumonia prevention and she is authorized for one month to increase her med. Cautioned against constipation and drowsiness.   Patient Active Problem List   Diagnosis Date Noted  . Need for vaccination with 13-polyvalent pneumococcal conjugate vaccine 03/10/2016  . At high risk for falls 09/22/2015  . Anemia due to antineoplastic chemotherapy 09/12/2015  . DNR (do not resuscitate) 08/16/2015  . Chronic low back pain with sciatica 06/03/2015  . Hx of adenomatous colonic polyps 04/24/2015  . Opioid contract exists 04/18/2015  . Right knee pain 04/14/2015  . Heme positive stool 12/18/2014  . Allergic rhinitis 12/12/2014  . Pancreatic cancer metastasized to liver ([place]) 03/10/2014  . Abnormal liver ultrasound 02/27/2014  . Anemia, iron deficiency 11/03/2013  . Annual physical exam 10/16/2013  . IGT (impaired glucose tolerance) 02/19/2013  . Back pain with radiation 05/24/2012  . Primary generalized (osteo)arthritis 05/12/2012  . Anxiety 09/15/2011  . GERD (gastroesophageal reflux disease) 08/20/2010  . Essential hypertension 01/07/2010    Outpatient Encounter Prescriptions as of 04/18/2016  Medication Sig  . amLODipine (NORVASC) 10 MG tablet Take 1 tablet (10 mg total) by mouth daily.  Marland Kitchen azelastine (ASTELIN) 0.1 % nasal spray Place 2 sprays into both nostrils 2 (two) times daily. Use in each nostril as directed  . Calcium-Magnesium-Vitamin D (CALCIUM 1200+D3 PO) Take 2 tablets by mouth daily.  . cholestyramine light (PREVALITE) 4 GM/DOSE powder MIX AND TAKE 1 SCOOP BY MOUTH DAILY  . clonazePAM (KLONOPIN) 0.5 MG tablet Take 1 tablet (0.5 mg total) by mouth 2 (two) times daily  as needed. for anxiety  . cycloSPORINE (RESTASIS) 0.05 % ophthalmic emulsion Place 1 drop into both eyes 2 (two) times daily.  . ferrous sulfate 325 (65 FE) MG tablet Take 325 mg by mouth daily with breakfast.  . HYDROcodone-acetaminophen (NORCO) 5-325 MG tablet Take 1 tablet by mouth every 6 (six) hours as needed.  Marland Kitchen HYDROcodone-acetaminophen (NORCO/VICODIN) 5-325 MG tablet One tablet twice daily  . lidocaine-prilocaine (EMLA) cream APPLY A QUARTER SIZE AMOUNT TO PORT SITE 1 HOURS PRIOR TO CHEMO DO NOT RUB IN COVER WITH PLASTIC WRAP  . megestrol (MEGACE) 40 MG/ML suspension Take 200 mg by mouth daily.   . metoprolol tartrate (LOPRESSOR) 25 MG tablet Take 0.5 tablets (12.5 mg total) by mouth 2 (two) times daily.  . montelukast (SINGULAIR) 10 MG tablet Take 1 tablet (10 mg total) by mouth at bedtime.  . pantoprazole (PROTONIX) 20 MG tablet TAKE 1 TABLET(20 MG) BY MOUTH DAILY  . Potassium 99 MG TABS Take 1 tablet by mouth daily.  . predniSONE (DELTASONE) 5 MG tablet TAKE 1 TABLET(5 MG) BY MOUTH DAILY WITH BREAKFAST  . Probiotic Product (HEALTHY COLON PO) Take 1 tablet by mouth every morning.   . [DISCONTINUED] HYDROcodone-acetaminophen (NORCO) 5-325 MG tablet Take 1 tablet by mouth every 4 (four) hours as needed.  Marland Kitchen azithromycin (ZITHROMAX) 250 MG tablet Take as directed (Patient not taking: Reported on 04/18/2016)  . predniSONE (STERAPRED UNI-PAK 21 TAB) 10 MG (21) TBPK tablet Take 1 tablet (10 mg total) by mouth daily. Take 6 tabs PO x 1 day, then 5, 4, 3,  2, 1 (Patient not taking: Reported on 04/18/2016)  . sulfamethoxazole-trimethoprim (BACTRIM DS,SEPTRA DS) 800-160 MG tablet Take 1 tablet by mouth 2 (two) times daily.   Facility-Administered Encounter Medications as of 04/18/2016  Medication  . dexamethasone (DECADRON) 10 mg in sodium chloride 0.9 % 50 mL IVPB    Allergies  Allergen Reactions  . Iohexol Hives and Shortness Of Breath    PATIENT HAD TO BE TAKEN TO ED, C/O WHELPS, HIVES,  DIFFICULTY BREATHING  . Aciphex [Rabeprazole Sodium] Other (See Comments)    unknown  . Amlodipine Besylate-Valsartan     Rash to high-dose (5/320)  . Esomeprazole Magnesium   . Omeprazole Other (See Comments)    Patient states "medication didn't work"  . Penicillins Other (See Comments)    unknown  . Ciprofloxacin Rash    Review of Systems  Constitutional: Negative for appetite change, chills, fatigue and fever.  HENT: Negative.  Negative for congestion, postnasal drip and rhinorrhea.   Eyes: Negative.  Negative for visual disturbance.  Respiratory: Negative.  Negative for cough and shortness of breath.   Cardiovascular: Positive for chest pain and leg swelling. Negative for palpitations.       Chest wall/rib pain  Gastrointestinal: Negative for constipation and diarrhea.  Genitourinary: Positive for dysuria and frequency. Negative for flank pain and hematuria.  Musculoskeletal: Positive for back pain.       Chronic  Neurological: Negative for dizziness and headaches.  Psychiatric/Behavioral: Negative for dysphoric mood. The patient is not nervous/anxious.    BP 120/64 (BP Location: Right Arm, Patient Position: Sitting, Cuff Size: Normal)   Pulse 62   Temp 97.7 F (36.5 C) (Oral)   Resp 18   Ht 5\' 3"  (1.6 m)   Wt 138 lb (62.6 kg)   SpO2 100%   BMI 24.45 kg/m   Physical Exam  Constitutional: She is oriented to person, place, and time. She appears well-developed and well-nourished. No distress.  HENT:  Head: Normocephalic.  Mouth/Throat: Oropharynx is clear and moist.  Neck: Normal range of motion.  Cardiovascular: Normal rate, regular rhythm and normal heart sounds.   Pulmonary/Chest: Effort normal and breath sounds normal. No respiratory distress. She has no wheezes. She has no rales. She exhibits tenderness.  Abdominal: Soft. Bowel sounds are normal. There is no tenderness.  Lymphadenopathy:    She has no cervical adenopathy.  Neurological: She is alert and oriented  to person, place, and time.  Psychiatric: She has a normal mood and affect. Her behavior is normal.    ASSESSMENT/PLAN:  1. Frequency of urination  - POCT urinalysis dipstick- positive for trace blood and leukocytes.  Will treat and culture urine - Urine Culture  2. Closed fracture of five ribs of right side with routine healing, subsequent encounter   3. Chronic midline low back pain without sciatica    Patient Instructions  Do the breathing exercise 2 - 3 times a day to prevent pneumonia Report any increase in cough or shortness of breath  You may increase the pain medicine temporarily to 3-4 times a day WATCH OUT for constipation  Take the urinary antibiotic sulfamethoxazole twice a day for 3 days Drink lots of water Come back in a week for follow up      Raylene Everts, MD

## 2016-04-18 NOTE — Telephone Encounter (Signed)
Coming in for appt this am

## 2016-04-18 NOTE — Patient Instructions (Addendum)
Do the breathing exercise 2 - 3 times a day to prevent pneumonia Report any increase in cough or shortness of breath  You may increase the pain medicine temporarily to 3-4 times a day WATCH OUT for constipation  Take the urinary antibiotic sulfamethoxazole twice a day for 3 days Drink lots of water Come back in a week for follow up

## 2016-04-28 ENCOUNTER — Ambulatory Visit (HOSPITAL_COMMUNITY)
Admission: RE | Admit: 2016-04-28 | Discharge: 2016-04-28 | Disposition: A | Discharge status: Home / Self Care | Payer: Medicare Other | Source: Ambulatory Visit | Attending: Oncology | Admitting: Oncology

## 2016-04-28 ENCOUNTER — Other Ambulatory Visit (HOSPITAL_COMMUNITY): Payer: Self-pay | Admitting: Hematology & Oncology

## 2016-04-28 DIAGNOSIS — Z13820 Encounter for screening for osteoporosis: Secondary | ICD-10-CM | POA: Insufficient documentation

## 2016-04-28 DIAGNOSIS — X58XXXD Exposure to other specified factors, subsequent encounter: Secondary | ICD-10-CM | POA: Diagnosis not present

## 2016-04-28 DIAGNOSIS — C787 Secondary malignant neoplasm of liver and intrahepatic bile duct: Principal | ICD-10-CM

## 2016-04-28 DIAGNOSIS — R634 Abnormal weight loss: Secondary | ICD-10-CM

## 2016-04-28 DIAGNOSIS — R197 Diarrhea, unspecified: Secondary | ICD-10-CM

## 2016-04-28 DIAGNOSIS — M85851 Other specified disorders of bone density and structure, right thigh: Secondary | ICD-10-CM | POA: Diagnosis not present

## 2016-04-28 DIAGNOSIS — S2232XA Fracture of one rib, left side, initial encounter for closed fracture: Secondary | ICD-10-CM

## 2016-04-28 DIAGNOSIS — S2232XD Fracture of one rib, left side, subsequent encounter for fracture with routine healing: Secondary | ICD-10-CM | POA: Insufficient documentation

## 2016-04-28 DIAGNOSIS — C259 Malignant neoplasm of pancreas, unspecified: Secondary | ICD-10-CM

## 2016-05-01 ENCOUNTER — Ambulatory Visit (INDEPENDENT_AMBULATORY_CARE_PROVIDER_SITE_OTHER): Payer: Medicare Other | Admitting: Family Medicine

## 2016-05-01 ENCOUNTER — Encounter: Payer: Self-pay | Admitting: Family Medicine

## 2016-05-01 VITALS — BP 120/74 | HR 74 | Resp 16 | Ht 63.0 in | Wt 139.0 lb

## 2016-05-01 DIAGNOSIS — I10 Essential (primary) hypertension: Secondary | ICD-10-CM

## 2016-05-01 DIAGNOSIS — Z79891 Long term (current) use of opiate analgesic: Secondary | ICD-10-CM

## 2016-05-01 DIAGNOSIS — R319 Hematuria, unspecified: Secondary | ICD-10-CM

## 2016-05-01 DIAGNOSIS — Z9181 History of falling: Secondary | ICD-10-CM | POA: Diagnosis not present

## 2016-05-01 DIAGNOSIS — M549 Dorsalgia, unspecified: Secondary | ICD-10-CM | POA: Diagnosis not present

## 2016-05-01 DIAGNOSIS — F419 Anxiety disorder, unspecified: Secondary | ICD-10-CM

## 2016-05-01 DIAGNOSIS — N390 Urinary tract infection, site not specified: Secondary | ICD-10-CM | POA: Diagnosis not present

## 2016-05-01 DIAGNOSIS — Z0289 Encounter for other administrative examinations: Secondary | ICD-10-CM

## 2016-05-01 LAB — POCT URINALYSIS DIPSTICK
Glucose, UA: NEGATIVE
NITRITE UA: NEGATIVE
PH UA: 5.5
PROTEIN UA: 100
Spec Grav, UA: 1.02
UROBILINOGEN UA: 0.2

## 2016-05-01 NOTE — Patient Instructions (Addendum)
Please keep appointment in February, call if you need me sooner  You appear to have a urinary tract infection, will call you early next week, when results of test are available. Drink water often and a small amount of cranberry juice every day  Reduce hydrocodone to one daily IF NEEDED, use tylenol 500 mg one twice daily for arthritis pain.Hydsrocodone is making you sleepy, forgetful, and having you wave your cane in the  Air , rather than use it to balance  Thankful that you are recovered from rib fracture  Thank you  for choosing [facility]. We consider it a privelige to serve you.  Delivering excellent health care in a caring and  compassionate way is our goal.  Partnering with you,  so that together we can achieve this goal is our strategy.

## 2016-05-02 ENCOUNTER — Other Ambulatory Visit (HOSPITAL_COMMUNITY)
Admission: RE | Admit: 2016-05-02 | Discharge: 2016-05-02 | Disposition: A | Discharge status: Home / Self Care | Payer: Medicare Other | Source: Other Acute Inpatient Hospital | Attending: Family Medicine | Admitting: Family Medicine

## 2016-05-02 DIAGNOSIS — N390 Urinary tract infection, site not specified: Secondary | ICD-10-CM | POA: Insufficient documentation

## 2016-05-03 LAB — URINE CULTURE

## 2016-05-05 ENCOUNTER — Telehealth: Payer: Self-pay | Admitting: Family Medicine

## 2016-05-05 DIAGNOSIS — N390 Urinary tract infection, site not specified: Secondary | ICD-10-CM | POA: Insufficient documentation

## 2016-05-05 NOTE — Assessment & Plan Note (Signed)
Symptomatic , despite recent treatment, will f/u c/s

## 2016-05-05 NOTE — Assessment & Plan Note (Signed)
Re educated re need to use cane for balance

## 2016-05-05 NOTE — Assessment & Plan Note (Signed)
Reduced ned for opioids, has increased dosing recently due to rib fracture, now confused, denying rib pain, advised to reduce opioid to one daily if needed, /u in 4 to 6 weeks

## 2016-05-05 NOTE — Assessment & Plan Note (Signed)
Controlled, no change in medication DASH diet and commitment to daily physical activity for a minimum of 30 minutes discussed and encouraged, as a part of hypertension management. The importance of attaining a healthy weight is also discussed.  BP/Weight 05/01/2016 04/18/2016 04/10/2016 04/07/2016 03/10/2016 03/05/2016 0000000  Systolic BP 123456 [id number] 123456 [id number]  Diastolic BP 74 64 67 69 64 69 74  Wt. (Lbs) 139 138 136.1 137 138 - 137.4  BMI 24.62 24.45 24.11 24.27 24.06 - 23.96

## 2016-05-05 NOTE — Assessment & Plan Note (Signed)
Controlled, no change in medication  

## 2016-05-05 NOTE — Progress Notes (Signed)
   [name]     MRN: [id number]      DOB: Sep 04, 1928   HPI [name] is here due to concerns of back pain and confusion, family notes her waving her cane in the air instead of using it for support, with recent diagnosis of UTI, uncertain if adequately treated, urine culture specimen  Not collected. Pt also has been taking an increased amount of pain medication due to rib fracture, though states "no pain" , just following medical advice  ROS Denies recent fever or chills. Denies sinus pressure, nasal congestion, ear pain or sore throat. Denies chest congestion, productive cough or wheezing. Denies chest pains, palpitations and leg swelling Denies abdominal pain, nausea, vomiting,diarrhea or constipation.   C/o mild  Dysuria, and  frequency, Denies uncontrolled  joint pain, swelling and limitation in mobility. Denies headaches, seizures, numbness, or tingling. Denies depression, anxiety or insomnia. Denies skin break down or rash.   PE  BP 120/74   Pulse 74   Resp 16   Ht 5\' 3"  (1.6 m)   Wt 139 lb (63 kg)   SpO2 97%   BMI 24.62 kg/m   Patient alert and oriented and in no cardiopulmonary distress.  HEENT: No facial asymmetry, EOMI,   oropharynx pink and moist.  Neck supple no JVD, no mass.  Chest: Clear to auscultation bilaterally.  CVS: S1, S2 no murmurs, no S3.Regular rate.  ABD: Soft non tender. No renal angle or supra pubic tenderness  Ext: No edema  MS: Decreased  ROM spine, shoulders, hips and knees.  Skin: Intact, no ulcerations or rash noted.  Psych: Good eye contact, normal affect. Memory intact not anxious or depressed appearing.  CNS: CN 2-12 intact, power,  normal throughout.no focal deficits noted.   Assessment & Plan UTI (urinary tract infection) Symptomatic , despite recent treatment, will f/u c/s  Anxiety Controlled, no change in medication   Back pain with radiation Reduced ned for opioids, has increased dosing recently due to rib  fracture, now confused, denying rib pain, advised to reduce opioid to one daily if needed, /u in 4 to 6 weeks  At high risk for falls Re educated re need to use cane for balance  Essential hypertension Controlled, no change in medication DASH diet and commitment to daily physical activity for a minimum of 30 minutes discussed and encouraged, as a part of hypertension management. The importance of attaining a healthy weight is also discussed.  BP/Weight 05/01/2016 04/18/2016 04/10/2016 04/07/2016 03/10/2016 03/05/2016 0000000  Systolic BP 123456 123456 [id number] 0000000 123456 [id number] 123456  Diastolic BP 74 64 67 69 64 69 74  Wt. (Lbs) 139 138 136.1 137 138 - 137.4  BMI 24.62 24.45 24.11 24.27 24.06 - 23.96       Opioid contract exists Pt advised to reduce opioid to one daily , if needed,as recently confused with increased dose , and denies any pain

## 2016-05-05 NOTE — Telephone Encounter (Signed)
pls let pt know urine contaminated, no definite result from c/s, will need I/O cath specimen sent if still symptomatic (nurse visit  pls let me know what she reports and follow through as needed

## 2016-05-05 NOTE — Assessment & Plan Note (Signed)
Pt advised to reduce opioid to one daily , if needed,as recently confused with increased dose , and denies any pain

## 2016-05-06 ENCOUNTER — Telehealth: Payer: Self-pay | Admitting: Family Medicine

## 2016-05-06 NOTE — Telephone Encounter (Signed)
Called and left message for patient to return call.  

## 2016-05-06 NOTE — Telephone Encounter (Signed)
[name] is stating that she is still feeling weak and that she is having urine frequency and burning, please advise?

## 2016-05-07 NOTE — Telephone Encounter (Signed)
Patient scheduled for 12/28 for I&O cath

## 2016-05-08 ENCOUNTER — Ambulatory Visit: Payer: Medicare Other

## 2016-05-08 ENCOUNTER — Other Ambulatory Visit (HOSPITAL_COMMUNITY)
Admission: RE | Admit: 2016-05-08 | Discharge: 2016-05-08 | Disposition: A | Discharge status: Home / Self Care | Payer: Medicare Other | Source: Other Acute Inpatient Hospital | Attending: Family Medicine | Admitting: Family Medicine

## 2016-05-08 DIAGNOSIS — N3000 Acute cystitis without hematuria: Secondary | ICD-10-CM | POA: Insufficient documentation

## 2016-05-08 NOTE — Progress Notes (Signed)
Patient in for I&O cath due to contaminated previous specimen.  Patient made aware of steps of procedure and consented verbally.   Procedure carried out in the presence of PCP.  No voiced complaints.  No sign of distress.   Specimen sent for culture.

## 2016-05-09 ENCOUNTER — Other Ambulatory Visit: Payer: Self-pay

## 2016-05-09 ENCOUNTER — Ambulatory Visit (HOSPITAL_COMMUNITY): Admission: RE | Admit: 2016-05-09 | Payer: Medicare Other | Source: Ambulatory Visit

## 2016-05-09 MED ORDER — HYDROCODONE-ACETAMINOPHEN 5-325 MG PO TABS: ORAL_TABLET | ORAL | 0 refills | Status: DC

## 2016-05-10 LAB — URINE CULTURE

## 2016-05-13 NOTE — Telephone Encounter (Signed)
Pt in for I&O

## 2016-05-14 ENCOUNTER — Other Ambulatory Visit: Payer: Self-pay | Admitting: Family Medicine

## 2016-05-14 ENCOUNTER — Encounter (HOSPITAL_COMMUNITY): Payer: Medicare Other | Attending: Hematology and Oncology | Admitting: Hematology & Oncology

## 2016-05-14 ENCOUNTER — Encounter (HOSPITAL_COMMUNITY): Payer: Self-pay | Admitting: Hematology & Oncology

## 2016-05-14 VITALS — BP 182/65 | HR 63 | Temp 98.3°F | Resp 18 | Ht 63.5 in | Wt 140.0 lb

## 2016-05-14 DIAGNOSIS — D649 Anemia, unspecified: Secondary | ICD-10-CM

## 2016-05-14 DIAGNOSIS — C259 Malignant neoplasm of pancreas, unspecified: Secondary | ICD-10-CM | POA: Diagnosis not present

## 2016-05-14 DIAGNOSIS — C787 Secondary malignant neoplasm of liver and intrahepatic bile duct: Secondary | ICD-10-CM | POA: Insufficient documentation

## 2016-05-14 DIAGNOSIS — R197 Diarrhea, unspecified: Secondary | ICD-10-CM

## 2016-05-14 DIAGNOSIS — I10 Essential (primary) hypertension: Secondary | ICD-10-CM

## 2016-05-14 DIAGNOSIS — K5900 Constipation, unspecified: Secondary | ICD-10-CM

## 2016-05-14 DIAGNOSIS — R634 Abnormal weight loss: Secondary | ICD-10-CM

## 2016-05-14 DIAGNOSIS — D696 Thrombocytopenia, unspecified: Secondary | ICD-10-CM | POA: Diagnosis not present

## 2016-05-14 DIAGNOSIS — C801 Malignant (primary) neoplasm, unspecified: Secondary | ICD-10-CM | POA: Insufficient documentation

## 2016-05-14 DIAGNOSIS — R195 Other fecal abnormalities: Secondary | ICD-10-CM

## 2016-05-14 DIAGNOSIS — M25561 Pain in right knee: Secondary | ICD-10-CM

## 2016-05-14 DIAGNOSIS — C252 Malignant neoplasm of tail of pancreas: Secondary | ICD-10-CM | POA: Insufficient documentation

## 2016-05-14 NOTE — Patient Instructions (Signed)
[place] at [facility] Discharge Instructions  RECOMMENDATIONS MADE BY THE CONSULTANT AND ANY TEST RESULTS WILL BE SENT TO YOUR REFERRING PHYSICIAN.  You were seen today by Dr. Whitney Muse. Get your MRI done. Return after MRI has been completed.   Thank you for choosing [place] at [facility] to provide your oncology and hematology care.  To afford each patient quality time with our provider, please arrive at least 15 minutes before your scheduled appointment time.    If you have a lab appointment with the [facility] please come in thru the  Main Entrance and check in at the main information desk  You need to re-schedule your appointment should you arrive 10 or more minutes late.  We strive to give you quality time with our providers, and arriving late affects you and other patients whose appointments are after yours.  Also, if you no show three or more times for appointments you may be dismissed from the clinic at the providers discretion.     Again, thank you for choosing [facility].  Our hope is that these requests will decrease the amount of time that you wait before being seen by our physicians.       _____________________________________________________________  Should you have questions after your visit to [facility], please contact our office at [phone] between the hours of 8:30 a.m. and 4:30 p.m.  Voicemails left after 4:30 p.m. will not be returned until the following business day.  For prescription refill requests, have your pharmacy contact our office.       Resources For Cancer Patients and their Caregivers ? American [employer] Society: Can assist with transportation, wigs, general needs, runs Look Good Feel Better.        (828)763-1231 ? Cancer Care: Provides financial assistance, online support groups, medication/co-pay assistance.  1-800-813-HOPE 4423728728) ? [facility] Assists [place] cancer patients and their families through emotional , educational and financial support.  562-623-4529 ? [place] DSS Where to apply for food stamps, Medicaid and utility assistance. 385 858 7044 ? [facility]: Transportation to medical appointments. 628-184-3627 ? Social Security Administration: May apply for disability if have a Stage IV cancer. 640-021-5026 240 661 4443 ? [employer], Disability and Transit Services: Assists with nutrition, care and transit needs. [place] Support Programs: @10RELATIVEDAYS @ > Cancer Support Group  2nd Tuesday of the month 1pm-2pm, Journey Room  > Creative Journey  3rd Tuesday of the month 1130am-1pm, Journey Room  > Look Good Feel Better  1st Wednesday of the month 10am-12 noon, Journey Room (Call Britt to register [phone])

## 2016-05-14 NOTE — Progress Notes (Signed)
[name], MD [address] [place] [address]   DIAGNOSIS: Pancreatic cancer metastasized to liver   Staging form: Pancreas, AJCC 7th Edition     Clinical: Stage IV (T3, N1, M1) - Signed by Baird Cancer, PA-C on 03/16/2014     Pathologic: No stage assigned - Unsigned  CONTRAST MEDIA ALLERGY  SUMMARY OF ONCOLOGIC HISTORY:   Pancreatic cancer metastasized to liver ([place])   03/10/2014 Initial Diagnosis    Pancreatic cancer metastasized to liver      03/22/2014 - 03/05/2016 Chemotherapy    Abraxane/Gemzar days 1, 8, every 28 days.  Day 15 was cancelled due to leukopenia and thrombocytopenia on day 15 cycle 1.      05/24/2014 Treatment Plan Change    Day 8 of cycle 3 is held with ANC of 1.1      06/05/2014 Imaging    CT C/A/P . Interval decrease in size of the pancreatic tail mass.Improved hepatic metastatic disease. No new lesions. No CT findings for metastatic disease involving the chest.      08/09/2014 Tumor Marker    CA 19-9= 33 (WNL)      10/26/2014 Imaging    MRI- Continued interval decrease in size of the hepatic metastatic lesions and no new lesions are identified. Continued decrease in size of the pancreatic tail lesion.      01/03/2015 Tumor Marker    Results for LEXXI, KOSLOW (MRN [id number]) as of 01/18/2015 12:52  01/03/2015 10:00 CA 19-9: 14       01/17/2015 Imaging    MRI- Response to therapy of hepatic metastasis.  Similar size of a pancreatic tail lesion.      03/20/2015 Imaging    MRI-L spine- Severe disc space narrowing at L2-L3, with endplate reactive changes. Large disc extrusion into the ventral epidural space,central to the RIGHT with a cephalad migrated free fragment. Additional Large disc extrusion into the retroperitone...      03/22/2015 Imaging    CT pelvis- No evidence for metastatic disease within the pelvis.      07/24/2015 Imaging    MRI abd- Continued response to therapy, with no residual detectable liver  metastases. No new sites of metastatic disease in the abdomen.       08/15/2015 Code Status     She confirms desire for DNR status.      12/21/2015 Imaging    MRI liver- Severe image degradation due to motion artifact reducing diagnostic sensitivity and specificity. Reduce conspicuity of the pancreatic tail lesion suggesting further improvement. The original liver lesions have resolved.      03/05/2016 Treatment Plan Change    Chemotherapy holiday/Break after 2 years worth of treatment      04/07/2016 Imaging    CT chest- When compared to recent chest CT, new minimally displaced anterior left sixth rib fracture. Slight increase in subcarinal adenopathy      04/28/2016 Imaging    Bone density- BMD as determined from Femur Neck Right is 0.705 g/cm2 with a T-Score of -2.4. This patient is considered osteopenic according to [place] [facility]) criteria. Compared with the prior study on 02/23/2013, the BMD of the lumbar spine/rt. femoral neck show a statistically significant decrease.        CURRENT THERAPY: Gemzar/Abraxane  INTERVAL HISTORY: Breona Cherubin 83 y.o. female returns for follow-up of her stage IV pancreatic cancer. She has been on a chemotherapy holiday since 10/25 secondary to excessive fatigue, declining PS. She is well  groomed today, looks more like her old self again.   She is unaccompanied. She is doing well.   She missed her MRI on 05/09/16. She thought she had an MRI already and forgot about this appointment.  She has some urgency with urination and occasionally can't make it to the bathroom. Her PCP believes it might be an infection of the bladder. She saw Dr. Moshe Cipro on 12/21.   She has an itchy area on her back. She believes it is from dry skin.   She is not as tired or worn out. Denies abdominal pain. She is eating well.   Denies insomnia or any other concerns.    MEDICAL HISTORY: Past Medical History:  Diagnosis Date  . Anemia due to  antineoplastic chemotherapy 09/12/2015   Started Aranesp 500 mcg on 09/12/2015  . Anxiety   . Chronic diarrhea   . Depression   . DNR (do not resuscitate) 08/16/2015  . Erosive esophagitis   . GERD (gastroesophageal reflux disease)   . Hx of adenomatous colonic polyps    tubular adenomas, last found in 2008  . Hyperplastic colon polyp 03/19/10   tcs by Dr. Gala Romney  . Hypertension 20 years   . Kidney stone    hx/ crushed   . Opioid contract exists 04/18/2015   With Dr. Moshe Cipro  . Pancreatic cancer ([place]) 02/2014  . Pancreatic cancer metastasized to liver ([place]) 03/10/2014  . Schatzki's ring 08/26/10   Last dilated on EGD by Dr. Trevor Iha [facility], linear gastric erosions, BI hemigastrectomy    has Essential hypertension; GERD (gastroesophageal reflux disease); Anxiety; Primary generalized (osteo)arthritis; Back pain with radiation; IGT (impaired glucose tolerance); Anemia, iron deficiency; Abnormal liver ultrasound; Pancreatic cancer metastasized to liver [facility]); Allergic rhinitis; Heme positive stool; Right knee pain; Opioid contract exists; Hx of adenomatous colonic polyps; Chronic low back pain with sciatica; DNR (do not resuscitate); Anemia due to antineoplastic chemotherapy; At high risk for falls; and UTI (urinary tract infection) on her problem list.     is allergic to iohexol; aciphex [rabeprazole sodium]; amlodipine besylate-valsartan; esomeprazole magnesium; omeprazole; penicillins; and ciprofloxacin.  Ms. Strauser does not currently have medications on file.  SURGICAL HISTORY: Past Surgical History:  Procedure Laterality Date  . BREAST LUMPECTOMY Left   . bunion removal     from both feet   . CHOLECYSTECTOMY  1965   . COLONOSCOPY  03/19/2010   DR Gala Romney,, normal TI, pancolonic diverticula, random colon bx neg., hyperplastic polyps removed  . ESOPHAGOGASTRODUODENOSCOPY  11/22/2003   DR Gala Romney, erosive RE, Billroth I  . ESOPHAGOGASTRODUODENOSCOPY  08/26/10   Dr. Gala Romney- moderate severe ERE,  Scahtzki ring s/p dilation, Billroth I, linear gastric erosions, bx-gastric xanthelasma  . LEFT SHOULDER SURGERY  2009   DR HARRISON  . ORIF ANKLE FRACTURE Right 05/22/2013   Procedure: OPEN REDUCTION INTERNAL FIXATION (ORIF) RIGHT ANKLE FRACTURE;  Surgeon: Sanjuana Kava, MD;  Location: [facility];  Service: Orthopedics;  Laterality: Right;  . stomach ulcer  50 years ago    had some of her stomach removed     SOCIAL HISTORY: Social History   Social History  . Marital status: Widowed    Spouse name: N/A  . Number of children: 2  . Years of education: N/A   Occupational History  . retired from Teacher, adult education Retired   Social History Main Topics  . Smoking status: Former Smoker    Types: Cigarettes    Quit date: 03/10/2016  . Smokeless tobacco: Never Used  Comment: quit a few weeks ago   . Alcohol use No  . Drug use: No  . Sexual activity: No   Other Topics Concern  . Not on file   Social History Narrative  . No narrative on file    FAMILY HISTORY: Family History  Problem Relation Age of Onset  . Hypertension Mother   . Kidney failure Brother     X1 ON DIALYSIS  . Diabetes Sister   . Cancer Sister     X2  . Hypertension Father   . Liver disease Neg Hx   . Colon cancer Neg Hx     Review of Systems  Constitutional: Negative.  Negative for malaise/fatigue.       Neg loss of appetite.   HENT: Negative.   Eyes: Negative.   Respiratory: Negative.   Cardiovascular: Negative.   Gastrointestinal: Negative.   Genitourinary: Positive for urgency.  Musculoskeletal: Negative.   Skin: Positive for itching (dry skin).  Neurological: Negative.   Endo/Heme/Allergies: Negative.   Psychiatric/Behavioral: Negative.  The patient does not have insomnia.   All other systems reviewed and are negative.  14 point review of systems was performed and is negative except as detailed under history of present illness and above  PHYSICAL EXAMINATION  ECOG PERFORMANCE STATUS: 1 - Symptomatic  but completely ambulatory  Vitals with BMI 05/14/2016  Height 5' 3.5"  Weight 140 lbs  BMI 32.1  Systolic 224  Diastolic 65  Pulse 63  Respirations 18    Physical Exam  Constitutional: She is oriented to person, place, and time and well-developed, well-nourished, and in no distress.  Pt was able to get on exam table without assistance.   HENT:  Head: Normocephalic and atraumatic.  Eyes: EOM are normal. Pupils are equal, round, and reactive to light. No scleral icterus.  Neck: Normal range of motion. Neck supple.  Cardiovascular: Normal rate, regular rhythm and normal heart sounds.   Pulmonary/Chest: Effort normal and breath sounds normal.  Abdominal: Soft. Bowel sounds are normal. She exhibits no distension and no mass. There is no tenderness. There is no rebound and no guarding.  Musculoskeletal: Normal range of motion.  Lymphadenopathy:    She has no cervical adenopathy.  Neurological: She is alert and oriented to person, place, and time. Gait normal.  Chronic tremor, gait normal but slow   Skin: Skin is warm and dry.  Psychiatric: Mood, memory, affect and judgment normal.  Nursing note and vitals reviewed.  LABORATORY DATA: I have reviewed the data as listed.  Results for QUENTINA, FRONEK (MRN [id number]) as of 05/14/2016 10:13  Ref. Range 04/10/2016 10:10  Sodium Latest Ref Range: 135 - 145 mmol/L 139  Potassium Latest Ref Range: 3.5 - 5.1 mmol/L 3.9  Chloride Latest Ref Range: 101 - 111 mmol/L 108  CO2 Latest Ref Range: 22 - 32 mmol/L 21 (L)  BUN Latest Ref Range: 6 - 20 mg/dL 23 (H)  Creatinine Latest Ref Range: 0.44 - 1.00 mg/dL 1.05 (H)  Calcium Latest Ref Range: 8.9 - 10.3 mg/dL 8.9  EGFR (Non-African Amer.) Latest Ref Range: >60 mL/min 47 (L)  EGFR (African American) Latest Ref Range: >60 mL/min 55 (L)  Glucose Latest Ref Range: 65 - 99 mg/dL 161 (H)  Anion gap Latest Ref Range: 5 - 15  10  Alkaline Phosphatase Latest Ref Range: 38 - 126 U/L 75  Albumin Latest Ref  Range: 3.5 - 5.0 g/dL 3.9  AST Latest Ref Range: 15 - 41 U/L 26  ALT Latest Ref Range: 14 - 54 U/L 22  Total Protein Latest Ref Range: 6.5 - 8.1 g/dL 7.2  Total Bilirubin Latest Ref Range: 0.3 - 1.2 mg/dL 0.9  WBC Latest Ref Range: 4.0 - 10.5 K/uL 10.0  RBC Latest Ref Range: 3.87 - 5.11 MIL/uL 3.87  Hemoglobin Latest Ref Range: 12.0 - 15.0 g/dL 11.4 (L)  HCT Latest Ref Range: 36.0 - 46.0 % 35.1 (L)  MCV Latest Ref Range: 78.0 - 100.0 fL 90.7  MCH Latest Ref Range: 26.0 - 34.0 pg 29.5  MCHC Latest Ref Range: 30.0 - 36.0 g/dL 32.5  RDW Latest Ref Range: 11.5 - 15.5 % 16.0 (H)  Platelets Latest Ref Range: 150 - 400 K/uL 193  Neutrophils Latest Units: % 72  Lymphocytes Latest Units: % 21  Monocytes Relative Latest Units: % 5  Eosinophil Latest Units: % 2  Basophil Latest Units: % 0  NEUT# Latest Ref Range: 1.7 - 7.7 K/uL 7.2  Lymphocyte # Latest Ref Range: 0.7 - 4.0 K/uL 2.1  Monocyte # Latest Ref Range: 0.1 - 1.0 K/uL 0.5  Eosinophils Absolute Latest Ref Range: 0.0 - 0.7 K/uL 0.2  Basophils Absolute Latest Ref Range: 0.0 - 0.1 K/uL 0.0  WBC Morphology Unknown PLASMA CELLS  CA 19-9 Latest Ref Range: 0 - 35 U/mL 44 (H)   Results for LANISSA, CASHEN (MRN [id number]) as of 05/15/2016 18:34  Ref. Range 10/31/2015 10:09 01/02/2016 08:50 02/06/2016 12:21 03/05/2016 10:40 04/10/2016 10:10  CA 19-9 Latest Ref Range: 0 - 35 U/mL 21 21 38 (H) 52 (H) 44 (H)   RADIOLOGY: I have personally reviewed the radiological images as listed and agreed with the findings in the report.  Study Result   EXAM: DUAL X-RAY ABSORPTIOMETRY (DXA) FOR BONE MINERAL DENSITY  IMPRESSION: Ordering Physician:  Dr. Baird Cancer,  Your patient Jaylyne Peri completed a BMD test on 04/28/2016 using the [place] (software version: 14.10) manufactured by [employer]. The following summarizes the results of our evaluation. PATIENT BIOGRAPHICAL: Name: ALLYSIA, INGLES Patient ID:  [id number] Birth Date: [birth year] Height: 63.0 in. Gender: Female Exam Date: 04/28/2016 Weight: 137.0 lbs. Indications: Advanced Age, Follow up Osteopenia, Height Loss, History of Fracture (Adult), Low Calcium Intake, Post Menopausal, Secondary Osteoporosis, Tobacco User Fractures: Ankle, Ribs Treatments: Calcium DENSITOMETRY RESULTS: Site      Region        Measured Date Measured Age WHO Classification Young Adult T-score BMD         %Change vs. Previous Significant Change (*) [facility] Spine L1-L4 (L2,L3) 04/28/2016 85.4 Normal -0.8 1.066 g/cm2 -15.6% Yes [facility] Spine L1-L4 (L2,L3) 02/23/2013 82.2 Normal 0.8 1.263 g/cm2 - -  DualFemur Neck Right 04/28/2016 85.4 Osteopenia -2.4 0.705 g/cm2 -16.7% Yes DualFemur Neck Right 02/23/2013 82.2 Osteopenia -1.4 0.846 g/cm2 - - ASSESSMENT: BMD as determined from Femur Neck Right is 0.705 g/cm2 with a T-Score of -2.4. This patient is considered osteopenic according to [facility]) criteria. Compared with the prior study on 02/23/2013, the BMD of the lumbar spine/rt. femoral neck show a statistically significant decrease. (L-2 and L-3 were excluded due to advanced degenerative changes.)  World Health Organization [facility]) criteria for post-menopausal, Caucasian Women: Normal:       T-score at or above -1 SD Osteopenia:   T-score between -1 and -2.5 SD Osteoporosis: T-score at or below -2.5 SD  RECOMMENDATIONS: [place] recommends that FDA-approved medial therapies be considered in postmenopausal women and men age 62 or  older with a: 1. Hip or vertebral (clinical or morphometric) fracture. 2. T-Score of < -2.5 at the spine or hip. 3. Ten-year fracture probability by FRAX of 3% or greater for hip fracture or 20% or greater for major osteoporotic fracture.  All treatment decisions require clinical judgment and consideration of individual patient factors, including patient preferences, co-morbidities,  previous drug use, risk factors not captured in the FRAX model (e.g. falls, vitamin D deficiency, increased bone turnover, interval significant decline in bone density) and possible under-or over-estimation of fracture risk by FRAX.  All patients should ensure an adequate intake of dietary calcium (1200 mg/d) and vitamin D (800 IU daily) unless contraindicated.  FOLLOW-UP: People with diagnosed cases of osteoporosis or osteopenia should be regularly tested for bone mineral density. For patients eligible for Medicare, routine testing is allowed once every 2 years. Testing frequency can be increased for patients who have rapidly progressing disease, or for those who are receiving medical therapy to restore bone mass.  I have reviewed this report, and agree with the above findings.  [facility] Dear Dr. Baird Cancer,  Your patient VIOLET SEABURY completed a FRAX assessment on 04/28/2016 using the [place] (analysis version: 14.10) manufactured by [employer]. The following summarizes the results of our evaluation.  PATIENT BIOGRAPHICAL: Name: MACKLYN, GLANDON Patient ID: [id number] Birth Date: Feb 23, 1929 Height:    63.0 in. Gender:     Female    [age]       Weight:    137.0 lbs. Ethnicity:  Black                            Exam Date: 04/28/2016  FRAX* RESULTS:  (version: 3.5) 10-year Probability of Fracture1 Major Osteoporotic Fracture2 Hip Fracture 12.3% 3.8% Population: [place] (Black) Risk Factors: History of Fracture (Adult), Secondary Osteoporosis  Based on Femur (Right) Neck BMD  1 -The 10-year probability of fracture may be lower than reported if the patient has received treatment. 2 -Major Osteoporotic Fracture: Clinical Spine, Forearm, Hip or Shoulder  *FRAX is a Materials engineer of the [employer] for Metabolic Bone Disease, a [place] (WHO) [employer].  ASSESSMENT: The probability of a major osteoporotic fracture is 12.3% within the next ten years.  The probability of a hip fracture is 3.8% within the next ten years.   Electronically Signed   By: Marijo Conception, M.D.   On: 04/28/2016 10:41   Study Result   CLINICAL DATA:  Cough, wheezing  EXAM: CHEST 1 VIEW  COMPARISON:  04/07/2016  FINDINGS: Cardiomediastinal silhouette is stable. No infiltrate or pleural effusion. No pulmonary edema. Right IJ Port-A-Cath with tip in SVC. No pneumothorax.  IMPRESSION: No active disease.   Electronically Signed   By: Lahoma Crocker M.D.   On: 04/10/2016 13:47    Study Result   CLINICAL DATA:  83 year old female with cough and left lower anterior chest pain for 2 days. Pancreatic cancer metastatic to liver. Subsequent encounter.  EXAM: CT CHEST WITHOUT CONTRAST  TECHNIQUE: Multidetector CT imaging of the chest was performed following the standard protocol without IV contrast.  COMPARISON:  04/07/2016 chest x-ray. 01/23/2016 chest CT. 12/21/2015 MR abdomen.  FINDINGS: Cardiovascular: Prominent coronary artery calcification. Heart size normal. Calcification and mild ectasia thoracic aorta. Calcified plaque with narrowing great vessels most notable involving the left common carotid artery. Right central  line tip distal superior vena cava.  Mediastinum/Nodes: Thyroid lesions stable. Calcified pretracheal lymph nodes unremarkable. Noncalcified sub carinal adenopathy has progressed since prior exam.  Lungs/Pleura: Biapical parenchymal scarring. Moderate centrilobular/paraseptal emphysema. 2 mm peripheral left lower lobe nodule unchanged.  Upper Abdomen: Bilateral renal lesions some of which appear to be complex cysts as noted on August 2017 MR unchanged from prior CT.  Musculoskeletal: New anterior left sixth rib fracture. Degenerative changes lower cervical spine and throughout the thoracic  spine. Mild thoracic kyphosis and scoliosis convex right.  IMPRESSION: When compared to recent chest CT, new minimally displaced anterior left sixth rib fracture. Slight increase in subcarinal adenopathy with remainder of findings without change as detailed above.   Electronically Signed   By: Genia Del M.D.   On: 04/07/2016 13:02      ASSESSMENT and THERAPY PLAN:   STAGE IV Pancreatic Cancer Ongoing response to therapy Mild chemotherapy induced neuropathy Anemia Hemoccult positive stool Thrombocytopenia Weight loss Diarrhea/constipation Chronic R knee pain Chemotherapy Holiday  She feels her appetite is improved, Weight is stable. She has been on a chemotherapy holiday since the end of October and clinically is doing better. Her PS had declined significantly, she was not self grooming and not active. She looks more like her old self today and has been more active.   Her BP is high today. She took her BP medication this morning. Will note a follow-up post MRI. If it remains elevated will contact Dr. Moshe Cipro.   I will reorder her MRI.   She will return for a follow up after her MRI.   All questions were answered. The patient knows to call the clinic with any problems, questions or concerns. We can certainly see the patient much sooner if necessary.  This document serves as a record of services personally performed by Ancil Linsey, MD. It was created on her behalf by Martinique Casey, a trained medical scribe. The creation of this record is based on the scribe's personal observations and the provider's statements to them. This document has been checked and approved by the attending provider.  I have reviewed the above documentation for accuracy and completeness, and I agree with the above.  This note was signed electronically. Molli Hazard, MD  05/14/2016

## 2016-05-15 ENCOUNTER — Telehealth: Payer: Self-pay | Admitting: Family Medicine

## 2016-05-15 ENCOUNTER — Encounter (HOSPITAL_COMMUNITY): Payer: Self-pay | Admitting: Hematology & Oncology

## 2016-05-15 NOTE — Telephone Encounter (Signed)
Spoke with patient.  She is c/o of more incontinence problems than that of infection.  She is aware of urine culture results.  Will address with oncology at her upcoming appt to see if there is any correlation

## 2016-05-15 NOTE — Telephone Encounter (Signed)
[name] is complaining that the urinary problem has gotten worse and she hasnt heard from the culture results nor Dr. Camillia Herter recommendation, please advise?

## 2016-05-19 ENCOUNTER — Ambulatory Visit (HOSPITAL_COMMUNITY)
Admission: RE | Admit: 2016-05-19 | Discharge: 2016-05-19 | Disposition: A | Discharge status: Home / Self Care | Payer: Medicare HMO | Source: Ambulatory Visit | Attending: Oncology | Admitting: Oncology

## 2016-05-19 DIAGNOSIS — C259 Malignant neoplasm of pancreas, unspecified: Secondary | ICD-10-CM

## 2016-05-19 DIAGNOSIS — C787 Secondary malignant neoplasm of liver and intrahepatic bile duct: Principal | ICD-10-CM

## 2016-05-21 ENCOUNTER — Ambulatory Visit (HOSPITAL_COMMUNITY): Payer: Medicare Other | Admitting: Hematology & Oncology

## 2016-05-23 ENCOUNTER — Ambulatory Visit (HOSPITAL_COMMUNITY)
Admission: RE | Admit: 2016-05-23 | Discharge: 2016-05-23 | Disposition: A | Discharge status: Home / Self Care | Payer: Medicare HMO | Source: Ambulatory Visit | Attending: Oncology | Admitting: Oncology

## 2016-05-23 ENCOUNTER — Other Ambulatory Visit (HOSPITAL_COMMUNITY): Payer: Self-pay | Admitting: Oncology

## 2016-05-23 DIAGNOSIS — K838 Other specified diseases of biliary tract: Secondary | ICD-10-CM | POA: Diagnosis not present

## 2016-05-23 DIAGNOSIS — Z9049 Acquired absence of other specified parts of digestive tract: Secondary | ICD-10-CM | POA: Diagnosis not present

## 2016-05-23 DIAGNOSIS — N281 Cyst of kidney, acquired: Secondary | ICD-10-CM | POA: Insufficient documentation

## 2016-05-23 DIAGNOSIS — C787 Secondary malignant neoplasm of liver and intrahepatic bile duct: Secondary | ICD-10-CM | POA: Diagnosis present

## 2016-05-23 DIAGNOSIS — C259 Malignant neoplasm of pancreas, unspecified: Secondary | ICD-10-CM

## 2016-05-23 LAB — POCT I-STAT CREATININE: CREATININE: 1.1 mg/dL — AB (ref 0.44–1.00)

## 2016-05-23 MED ORDER — GADOBENATE DIMEGLUMINE 529 MG/ML IV SOLN
15.0000 mL | Freq: Once | INTRAVENOUS | Status: AC | PRN
  Administered 2016-05-23: 13 mL via INTRAVENOUS

## 2016-05-26 ENCOUNTER — Ambulatory Visit (HOSPITAL_COMMUNITY): Payer: Medicare Other | Admitting: Hematology & Oncology

## 2016-05-26 ENCOUNTER — Encounter (HOSPITAL_COMMUNITY): Payer: Self-pay | Admitting: Hematology & Oncology

## 2016-05-26 ENCOUNTER — Encounter (HOSPITAL_COMMUNITY): Payer: Medicare Other | Attending: Hematology & Oncology | Admitting: Hematology & Oncology

## 2016-05-26 VITALS — BP 158/70 | HR 80 | Temp 98.8°F | Resp 18 | Ht 63.5 in | Wt 139.5 lb

## 2016-05-26 DIAGNOSIS — R32 Unspecified urinary incontinence: Secondary | ICD-10-CM

## 2016-05-26 DIAGNOSIS — Z66 Do not resuscitate: Secondary | ICD-10-CM

## 2016-05-26 DIAGNOSIS — C787 Secondary malignant neoplasm of liver and intrahepatic bile duct: Secondary | ICD-10-CM

## 2016-05-26 DIAGNOSIS — C259 Malignant neoplasm of pancreas, unspecified: Secondary | ICD-10-CM

## 2016-05-26 DIAGNOSIS — C252 Malignant neoplasm of tail of pancreas: Secondary | ICD-10-CM | POA: Diagnosis not present

## 2016-05-26 DIAGNOSIS — M25569 Pain in unspecified knee: Secondary | ICD-10-CM

## 2016-05-26 NOTE — Progress Notes (Signed)
[name], MD [address] [place] [address]   DIAGNOSIS: Pancreatic cancer metastasized to liver   Staging form: Pancreas, AJCC 7th Edition     Clinical: Stage IV (T3, N1, M1) - Signed by Baird Cancer, PA-C on 03/16/2014     Pathologic: No stage assigned - Unsigned  CONTRAST MEDIA ALLERGY  SUMMARY OF ONCOLOGIC HISTORY:   Pancreatic cancer metastasized to liver ([place])   03/10/2014 Initial Diagnosis    Pancreatic cancer metastasized to liver      03/22/2014 - 03/05/2016 Chemotherapy    Abraxane/Gemzar days 1, 8, every 28 days.  Day 15 was cancelled due to leukopenia and thrombocytopenia on day 15 cycle 1.      05/24/2014 Treatment Plan Change    Day 8 of cycle 3 is held with ANC of 1.1      06/05/2014 Imaging    CT C/A/P . Interval decrease in size of the pancreatic tail mass.Improved hepatic metastatic disease. No new lesions. No CT findings for metastatic disease involving the chest.      08/09/2014 Tumor Marker    CA 19-9= 33 (WNL)      10/26/2014 Imaging    MRI- Continued interval decrease in size of the hepatic metastatic lesions and no new lesions are identified. Continued decrease in size of the pancreatic tail lesion.      01/03/2015 Tumor Marker    Results for AIREL, MAGADAN (MRN [id number]) as of 01/18/2015 12:52  01/03/2015 10:00 CA 19-9: 14       01/17/2015 Imaging    MRI- Response to therapy of hepatic metastasis.  Similar size of a pancreatic tail lesion.      03/20/2015 Imaging    MRI-L spine- Severe disc space narrowing at L2-L3, with endplate reactive changes. Large disc extrusion into the ventral epidural space,central to the RIGHT with a cephalad migrated free fragment. Additional Large disc extrusion into the retroperitone...      03/22/2015 Imaging    CT pelvis- No evidence for metastatic disease within the pelvis.      07/24/2015 Imaging    MRI abd- Continued response to therapy, with no residual detectable liver  metastases. No new sites of metastatic disease in the abdomen.       08/15/2015 Code Status     She confirms desire for DNR status.      12/21/2015 Imaging    MRI liver- Severe image degradation due to motion artifact reducing diagnostic sensitivity and specificity. Reduce conspicuity of the pancreatic tail lesion suggesting further improvement. The original liver lesions have resolved.      03/05/2016 Treatment Plan Change    Chemotherapy holiday/Break after 2 years worth of treatment      04/07/2016 Imaging    CT chest- When compared to recent chest CT, new minimally displaced anterior left sixth rib fracture. Slight increase in subcarinal adenopathy      04/28/2016 Imaging    Bone density- BMD as determined from Femur Neck Right is 0.705 g/cm2 with a T-Score of -2.4. This patient is considered osteopenic according to [facility]) criteria. Compared with the prior study on 02/23/2013, the BMD of the lumbar spine/rt. femoral neck show a statistically significant decrease.       05/23/2016 Imaging    MRI abd- 1. Exam is significantly degraded by patient respiratory motion. Consider follow-up exams with CT abdomen with without contrast per pancreatic protocol. 2. Fullness in tail of pancreas is more prominent and potentially  increased in size. Recommend close attention on follow-up. (Consider CT as above) 3. No explanation for back pain.       CURRENT THERAPY: Observation  INTERVAL HISTORY: [name] 83 y.o. female returns for follow-up of her stage IV pancreatic cancer. She has been on a chemotherapy holiday since 10/25 secondary to excessive fatigue, declining PS. She is well groomed today, looks more like her old self again. She is accompanied by a friend who notes that the patient is doing much better, she is attending church again.  Seara notes urinary incontinence, she is up at night 3 to 4 times and also notes that she feels she does not  empty her bladder completely. She follows with Dr. Moshe Cipro about this. She is not interested in a urology referral.   She presents today to review her MRI of the abdomen.   She is not as tired or worn out. Denies abdominal pain. She is eating well. Weight reviewed and is stable.   Denies insomnia or any other concerns.    MEDICAL HISTORY: Past Medical History:  Diagnosis Date  . Anemia due to antineoplastic chemotherapy 09/12/2015   Started Aranesp 500 mcg on 09/12/2015  . Anxiety   . Chronic diarrhea   . Depression   . DNR (do not resuscitate) 08/16/2015  . Erosive esophagitis   . GERD (gastroesophageal reflux disease)   . Hx of adenomatous colonic polyps    tubular adenomas, last found in 2008  . Hyperplastic colon polyp 03/19/10   tcs by Dr. Gala Romney  . Hypertension 20 years   . Kidney stone    hx/ crushed   . Opioid contract exists 04/18/2015   With Dr. Moshe Cipro  . Pancreatic cancer ([place]) 02/2014  . Pancreatic cancer metastasized to liver ([place]) 03/10/2014  . Schatzki's ring 08/26/10   Last dilated on EGD by Dr. Trevor Iha [facility], linear gastric erosions, BI hemigastrectomy    has Essential hypertension; GERD (gastroesophageal reflux disease); Anxiety; Primary generalized (osteo)arthritis; Back pain with radiation; IGT (impaired glucose tolerance); Anemia, iron deficiency; Abnormal liver ultrasound; Pancreatic cancer metastasized to liver [facility]); Allergic rhinitis; Heme positive stool; Right knee pain; Opioid contract exists; Hx of adenomatous colonic polyps; Chronic low back pain with sciatica; DNR (do not resuscitate); Anemia due to antineoplastic chemotherapy; At high risk for falls; and UTI (urinary tract infection) on her problem list.     is allergic to iohexol; aciphex [rabeprazole sodium]; amlodipine besylate-valsartan; esomeprazole magnesium; omeprazole; penicillins; and ciprofloxacin.  Ms. Torpey had no medications administered during this visit.  SURGICAL HISTORY: Past  Surgical History:  Procedure Laterality Date  . BREAST LUMPECTOMY Left   . bunion removal     from both feet   . CHOLECYSTECTOMY  1965   . COLONOSCOPY  03/19/2010   DR Gala Romney,, normal TI, pancolonic diverticula, random colon bx neg., hyperplastic polyps removed  . ESOPHAGOGASTRODUODENOSCOPY  11/22/2003   DR Gala Romney, erosive RE, Billroth I  . ESOPHAGOGASTRODUODENOSCOPY  08/26/10   Dr. Gala Romney- moderate severe ERE, Scahtzki ring s/p dilation, Billroth I, linear gastric erosions, bx-gastric xanthelasma  . LEFT SHOULDER SURGERY  2009   DR HARRISON  . ORIF ANKLE FRACTURE Right 05/22/2013   Procedure: OPEN REDUCTION INTERNAL FIXATION (ORIF) RIGHT ANKLE FRACTURE;  Surgeon: Sanjuana Kava, MD;  Location: [facility];  Service: Orthopedics;  Laterality: Right;  . stomach ulcer  50 years ago    had some of her stomach removed     SOCIAL HISTORY: Social History   Social History  . Marital  status: Widowed    Spouse name: N/A  . Number of children: 2  . Years of education: N/A   Occupational History  . retired from Teacher, adult education Retired   Social History Main Topics  . Smoking status: Former Smoker    Types: Cigarettes    Quit date: 03/10/2016  . Smokeless tobacco: Never Used     Comment: quit a few weeks ago   . Alcohol use No  . Drug use: No  . Sexual activity: No   Other Topics Concern  . Not on file   Social History Narrative  . No narrative on file    FAMILY HISTORY: Family History  Problem Relation Age of Onset  . Hypertension Mother   . Kidney failure Brother     X1 ON DIALYSIS  . Diabetes Sister   . Cancer Sister     X2  . Hypertension Father   . Liver disease Neg Hx   . Colon cancer Neg Hx     Review of Systems  Constitutional: Negative.  Negative for malaise/fatigue.       Neg loss of appetite.   HENT: Negative.   Eyes: Negative.   Respiratory: Negative.   Cardiovascular: Negative.   Gastrointestinal: Negative.   Genitourinary: Positive for urgency.  Musculoskeletal:  Negative.   Skin: Positive for itching (dry skin).  Neurological: Negative.   Endo/Heme/Allergies: Negative.   Psychiatric/Behavioral: Negative.  The patient does not have insomnia.   All other systems reviewed and are negative. 14 point review of systems was performed and is negative except as detailed under history of present illness and above  PHYSICAL EXAMINATION  ECOG PERFORMANCE STATUS: 1 - Symptomatic but completely ambulatory Vitals - 1 value per visit 10/11/930  SYSTOLIC 355  DIASTOLIC 70  Pulse 80  Temperature 98.8  Respirations 18  Weight (lb) 139.5  Height 5' 3.5"  BMI 24.32    Physical Exam  Constitutional: She is oriented to person, place, and time and well-developed, well-nourished, and in no distress.  Pt was able to get on exam table without assistance.   HENT:  Head: Normocephalic and atraumatic.  Eyes: EOM are normal. Pupils are equal, round, and reactive to light. No scleral icterus.  Neck: Normal range of motion. Neck supple.  Cardiovascular: Normal rate, regular rhythm and normal heart sounds.   Pulmonary/Chest: Effort normal and breath sounds normal.  Abdominal: Soft. Bowel sounds are normal. She exhibits no distension and no mass. There is no tenderness. There is no rebound and no guarding.  Musculoskeletal: Normal range of motion.  Lymphadenopathy:    She has no cervical adenopathy.  Neurological: She is alert and oriented to person, place, and time. Gait normal.  Chronic tremor, gait normal but slow   Skin: Skin is warm and dry.  Psychiatric: Mood, memory, affect and judgment normal.  Nursing note and vitals reviewed.  LABORATORY DATA: I have reviewed the data as listed.  Results for MACHEL, VIOLANTE (MRN [id number]) as of 05/14/2016 10:13  Ref. Range 04/10/2016 10:10  Sodium Latest Ref Range: 135 - 145 mmol/L 139  Potassium Latest Ref Range: 3.5 - 5.1 mmol/L 3.9  Chloride Latest Ref Range: 101 - 111 mmol/L 108  CO2 Latest Ref Range: 22 - 32  mmol/L 21 (L)  BUN Latest Ref Range: 6 - 20 mg/dL 23 (H)  Creatinine Latest Ref Range: 0.44 - 1.00 mg/dL 1.05 (H)  Calcium Latest Ref Range: 8.9 - 10.3 mg/dL 8.9  EGFR (Non-African Amer.) Latest Ref Range: >60 mL/min  47 (L)  EGFR (African American) Latest Ref Range: >60 mL/min 55 (L)  Glucose Latest Ref Range: 65 - 99 mg/dL 161 (H)  Anion gap Latest Ref Range: 5 - 15  10  Alkaline Phosphatase Latest Ref Range: 38 - 126 U/L 75  Albumin Latest Ref Range: 3.5 - 5.0 g/dL 3.9  AST Latest Ref Range: 15 - 41 U/L 26  ALT Latest Ref Range: 14 - 54 U/L 22  Total Protein Latest Ref Range: 6.5 - 8.1 g/dL 7.2  Total Bilirubin Latest Ref Range: 0.3 - 1.2 mg/dL 0.9  WBC Latest Ref Range: 4.0 - 10.5 K/uL 10.0  RBC Latest Ref Range: 3.87 - 5.11 MIL/uL 3.87  Hemoglobin Latest Ref Range: 12.0 - 15.0 g/dL 11.4 (L)  HCT Latest Ref Range: 36.0 - 46.0 % 35.1 (L)  MCV Latest Ref Range: 78.0 - 100.0 fL 90.7  MCH Latest Ref Range: 26.0 - 34.0 pg 29.5  MCHC Latest Ref Range: 30.0 - 36.0 g/dL 32.5  RDW Latest Ref Range: 11.5 - 15.5 % 16.0 (H)  Platelets Latest Ref Range: 150 - 400 K/uL 193  Neutrophils Latest Units: % 72  Lymphocytes Latest Units: % 21  Monocytes Relative Latest Units: % 5  Eosinophil Latest Units: % 2  Basophil Latest Units: % 0  NEUT# Latest Ref Range: 1.7 - 7.7 K/uL 7.2  Lymphocyte # Latest Ref Range: 0.7 - 4.0 K/uL 2.1  Monocyte # Latest Ref Range: 0.1 - 1.0 K/uL 0.5  Eosinophils Absolute Latest Ref Range: 0.0 - 0.7 K/uL 0.2  Basophils Absolute Latest Ref Range: 0.0 - 0.1 K/uL 0.0  WBC Morphology Unknown PLASMA CELLS  CA 19-9 Latest Ref Range: 0 - 35 U/mL 44 (H)   Results for SHANTERA, MONTS (MRN [id number]) as of 05/15/2016 18:34  Ref. Range 10/31/2015 10:09 01/02/2016 08:50 02/06/2016 12:21 03/05/2016 10:40 04/10/2016 10:10  CA 19-9 Latest Ref Range: 0 - 35 U/mL 21 21 38 (H) 52 (H) 44 (H)   Results for ALEKHYA, GRAVLIN (MRN [id number]) as of 05/26/2016 20:30  Ref. Range 05/23/2016 09:21    Creatinine Latest Ref Range: 0.44 - 1.00 mg/dL 1.10 (H)    RADIOLOGY: I have personally reviewed the radiological images as listed and agreed with the findings in the report.  MR Abdomen W Wo Contrast (Accession [id number]) (Order [id number])  Imaging  Date: 05/19/2016 Department: [facility] Released By: Kathryne Sharper Authorizing: Baird Cancer, PA-C  Exam Information   Status Exam Begun  Exam Ended   Final [99] 05/23/2016 9:04 AM 05/23/2016 9:55 AM  PACS Images   Show images for MR Abdomen W Wo Contrast  Study Result   CLINICAL DATA:  Pancreatic cancer diagnosis November 2016. Back pain.  EXAM: MRI ABDOMEN WITHOUT AND WITH CONTRAST  TECHNIQUE: Multiplanar multisequence MR imaging of the abdomen was performed both before and after the administration of intravenous contrast.  CONTRAST:  20m MULTIHANCE GADOBENATE DIMEGLUMINE 529 MG/ML IV SOLN  COMPARISON:  MRI 12/20/2016.  FINDINGS: Exam degraded by respiratory motion.  Lower chest:  Lung bases are clear.  Hepatobiliary: No enhancing hepatic lesion. No hepatic lesion on noncontrast pulse sequences. There is mild intrahepatic and extrahepatic biliary duct dilatation. Not changed from comparison exam. No filling defects within the common bile duct. No external compression.  Pancreas: No pancreatic duct dilatation. Fullness in the pancreatic tail measuring 20 mm x 15 mm is slightly more prominent than prior. This lesion lesions slightly hypoenhancing (image 40, series 5004). Lesion also restricts diffusion (series  10 and series 11, image 17)  On diffusion-weighted imaging lesion measures slightly larger at 20 mm x 18 mm compared to 20 mm x 12 mm.  Spleen: Nonenhancing cystic lesion the spleen is unchanged.  Adrenals/urinary tract: Adrenal glands are normal. There multiple hemorrhagic and nonhemorrhagic nonenhancing renal cysts. One cystic lesion along the medial cortex of the RIGHT kidney measures  2.6 cm x 14 compared to 24 x 14 and has enhancing septations. (Image 28, series 27).  Stomach/Bowel: Stomach and limited of the small bowel is unremarkable  Vascular/Lymphatic: Abdominal aortic normal caliber. No retroperitoneal periportal lymphadenopathy.  Musculoskeletal: No aggressive osseous lesion  IMPRESSION: 1. Exam is significantly degraded by patient respiratory motion. Consider follow-up exams with CT abdomen with without contrast per pancreatic protocol. 2. Fullness in tail of pancreas is more prominent and potentially increased in size. Recommend close attention on follow-up. (Consider CT as above) 3. No explanation for back pain. 4. Stable intrahepatic and extrahepatic biliary duct dilatation following cholecystectomy. No hepatic metastasis identified. 5. Bosniak 59F renal cyst of the RIGHT kidney. Recommend attention on follow up. These results will be called to the ordering clinician or representative by the Radiologist Assistant, and communication documented in the PACS or zVision Dashboard.   Electronically Signed   By: Suzy Bouchard M.D.   On: 05/23/2016 11:12     ASSESSMENT and THERAPY PLAN:   STAGE IV Pancreatic Cancer Mild chemotherapy induced neuropathy Anemia Hemoccult positive stool Thrombocytopenia Weight loss Diarrhea/constipation Chronic R knee pain Chemotherapy Holiday Urinary incontinence  She feels her appetite is improved, Weight is stable. She has been on a chemotherapy holiday since the end of October and clinically is doing better. Her PS had declined significantly, she was not self grooming and not active. She is currently doing much better.  MRI was reviewed with the patient and her friend. I would recommend close observation at this point, she is just getting back on her feet physically. She is agreeable. We will regroup in 6 to 8 weeks with repeat labs including a CA 19-9 and discuss repeat imaging.   She was encouraged  to stay active.  Continue follow-up for regular well care with Dr Moshe Cipro.  Orders Placed This Encounter  Procedures  . CBC with Differential    Standing Status:   Future    Standing Expiration Date:   05/26/2017  . Comprehensive metabolic panel    Standing Status:   Future    Standing Expiration Date:   05/26/2017  . Cancer antigen 19-9    Standing Status:   Future    Standing Expiration Date:   05/26/2017     All questions were answered. The patient knows to call the clinic with any problems, questions or concerns. We can certainly see the patient much sooner if necessary.  This document serves as a record of services personally performed by Ancil Linsey, MD. It was created on her behalf by Martinique Casey, a trained medical scribe. The creation of this record is based on the scribe's personal observations and the provider's statements to them. This document has been checked and approved by the attending provider.  I have reviewed the above documentation for accuracy and completeness, and I agree with the above.  This note was signed electronically. Molli Hazard, MD  05/26/2016

## 2016-05-26 NOTE — Patient Instructions (Signed)
[place] at [facility] Discharge Instructions  RECOMMENDATIONS MADE BY THE CONSULTANT AND ANY TEST RESULTS WILL BE SENT TO YOUR REFERRING PHYSICIAN.  You were seen today by Dr. Whitney Muse. Return in 4 weeks for labs and follow up.   Thank you for choosing [place] at [facility] to provide your oncology and hematology care.  To afford each patient quality time with our provider, please arrive at least 15 minutes before your scheduled appointment time.    If you have a lab appointment with the [facility] please come in thru the  Main Entrance and check in at the main information desk  You need to re-schedule your appointment should you arrive 10 or more minutes late.  We strive to give you quality time with our providers, and arriving late affects you and other patients whose appointments are after yours.  Also, if you no show three or more times for appointments you may be dismissed from the clinic at the providers discretion.     Again, thank you for choosing [facility].  Our hope is that these requests will decrease the amount of time that you wait before being seen by our physicians.       _____________________________________________________________  Should you have questions after your visit to [facility], please contact our office at [phone] between the hours of 8:30 a.m. and 4:30 p.m.  Voicemails left after 4:30 p.m. will not be returned until the following business day.  For prescription refill requests, have your pharmacy contact our office.       Resources For Cancer Patients and their Caregivers ? American Cancer Society: Can assist with transportation, wigs, general needs, runs Look Good Feel Better.        636-309-2684 ? Cancer Care: Provides financial assistance, online support groups, medication/co-pay assistance.  1-800-813-HOPE 430-077-4598) ? [facility] Assists  [place] cancer patients and their families through emotional , educational and financial support.  319 076 0539 ? [place] DSS Where to apply for food stamps, Medicaid and utility assistance. 919 048 3917 ? [facility]: Transportation to medical appointments. 831-767-0300 ? Social Security Administration: May apply for disability if have a Stage IV cancer. 8540234818 757-317-7440 ? [employer], Disability and Transit Services: Assists with nutrition, care and transit needs. [place] Support Programs: @10RELATIVEDAYS @ > Cancer Support Group  2nd Tuesday of the month 1pm-2pm, Journey Room  > Creative Journey  3rd Tuesday of the month 1130am-1pm, Journey Room  > Look Good Feel Better  1st Wednesday of the month 10am-12 noon, Journey Room (Call [place] to register [phone])

## 2016-06-10 ENCOUNTER — Telehealth: Payer: Self-pay | Admitting: Family Medicine

## 2016-06-10 DIAGNOSIS — H16223 Keratoconjunctivitis sicca, not specified as Sjogren's, bilateral: Secondary | ICD-10-CM | POA: Diagnosis not present

## 2016-06-16 ENCOUNTER — Encounter: Payer: Self-pay | Admitting: Family Medicine

## 2016-06-16 ENCOUNTER — Ambulatory Visit (INDEPENDENT_AMBULATORY_CARE_PROVIDER_SITE_OTHER): Payer: Medicare HMO | Admitting: Family Medicine

## 2016-06-16 VITALS — BP 158/80 | HR 70 | Resp 16 | Ht 64.0 in | Wt 141.0 lb

## 2016-06-16 DIAGNOSIS — I10 Essential (primary) hypertension: Secondary | ICD-10-CM | POA: Diagnosis not present

## 2016-06-16 DIAGNOSIS — G8929 Other chronic pain: Secondary | ICD-10-CM

## 2016-06-16 DIAGNOSIS — M5440 Lumbago with sciatica, unspecified side: Secondary | ICD-10-CM | POA: Diagnosis not present

## 2016-06-16 DIAGNOSIS — F419 Anxiety disorder, unspecified: Secondary | ICD-10-CM | POA: Diagnosis not present

## 2016-06-16 DIAGNOSIS — J302 Other seasonal allergic rhinitis: Secondary | ICD-10-CM

## 2016-06-16 DIAGNOSIS — K219 Gastro-esophageal reflux disease without esophagitis: Secondary | ICD-10-CM

## 2016-06-16 DIAGNOSIS — R69 Illness, unspecified: Secondary | ICD-10-CM | POA: Diagnosis not present

## 2016-06-16 MED ORDER — CLONAZEPAM 0.5 MG PO TABS: 0.5000 mg | ORAL_TABLET | Freq: Two times a day (BID) | ORAL | 3 refills | Status: DC | PRN

## 2016-06-16 NOTE — Patient Instructions (Addendum)
Annual wellness mid May, call if you need me before  INCREASE metoprolol to half tablet twice daily  Tylenol for arthritis pain is good  Be careful not to fall   Thank you  for choosing [facility]. We consider it a privelige to serve you.  Delivering excellent health care in a caring and  compassionate way is our goal.  Partnering with you,  so that together we can achieve this goal is our strategy.

## 2016-06-22 ENCOUNTER — Encounter: Payer: Self-pay | Admitting: Family Medicine

## 2016-06-22 NOTE — Assessment & Plan Note (Signed)
Controlled, no change in medication  

## 2016-06-22 NOTE — Assessment & Plan Note (Signed)
Marked improvement with epidural injections

## 2016-06-22 NOTE — Progress Notes (Signed)
   [name]     MRN: [id number]      DOB: 02-08-1929   HPI [name] is here for follow up and re-evaluation of chronic medical conditions, medication management and review of any available recent lab and radiology data.  Preventive health is updated, specifically   Immunization.   Questions or concerns regarding consultations or procedures which the PT has had in the interim are  addressed. The PT denies any adverse reactions to current medications since the last visit. Reports excellent response to epidural injections , no longer on opioid pain management and is better off without this at this time Has question about cholestyramine started by Dr. Meda Coffee last visit, after speaking with the r she has no clear reason as to why she started this and none is documented, I advised pt to discontinue  ROS Denies recent fever or chills. Denies sinus pressure, nasal congestion, ear pain or sore throat. Denies chest congestion, productive cough or wheezing. Denies chest pains, palpitations and leg swelling Denies abdominal pain, nausea, vomiting,diarrhea or constipation.   Denies dysuria, frequency, hesitancy or incontinence. Denies uncontrolled  joint pain, swelling and limitation in mobility. Denies headaches, seizures, numbness, or tingling. Denies depression,uncontrolled  anxiety or insomnia. Denies skin break down or rash.   PE  BP (!) 158/80   Pulse 70   Resp 16   Ht 5\' 4"  (1.626 m)   Wt 141 lb (64 kg)   SpO2 98%   BMI 24.20 kg/m   Patient alert and oriented and in no cardiopulmonary distress.  HEENT: No facial asymmetry, EOMI,   oropharynx pink and moist.  Neck decreased though adequate ROM no JVD, no mass.  Chest: Clear to auscultation bilaterally.  CVS: S1, S2 no murmurs, no S3.Regular rate.  ABD: Soft non tender.   Ext: No edema  MS: decreased ROM spine, shoulders, hips and knees.  Skin: Intact, no ulcerations or rash noted.  Psych: Good eye contact, normal  affect. Memory impaired not anxious or depressed appearing.  CNS: CN 2-12 intact, power,  normal throughout.no focal deficits noted.   Assessment & Plan  Essential hypertension Adequate though sub optimal copntrol, however , she has inadvertently beenn taking HALF dose of metoprolol, re educated to take prescribed dose of twice daily dosing  DASH diet and commitment to daily physical activity for a minimum of 30 minutes discussed and encouraged, as a part of hypertension management. The importance of attaining a healthy weight is also discussed.  BP/Weight 06/16/2016 05/26/2016 05/14/2016 05/01/2016 04/18/2016 04/10/2016 0000000  Systolic BP 0000000 0000000 Q000111Q 123456 123456 [id number] 0000000  Diastolic BP 80 70 65 74 64 67 69  Wt. (Lbs) 141 139.5 140 139 138 136.1 137  BMI 24.2 24.32 24.41 24.62 24.45 24.11 24.27       Chronic low back pain with sciatica Marked improvement with epidural injections  Anxiety Controlled , no change in management  GERD (gastroesophageal reflux disease) Controlled, no change in medication   Allergic rhinitis Controlled, no change in medication

## 2016-06-22 NOTE — Assessment & Plan Note (Addendum)
Adequate though sub optimal copntrol, however , she has inadvertently beenn taking HALF dose of metoprolol, re educated to take prescribed dose of twice daily dosing  DASH diet and commitment to daily physical activity for a minimum of 30 minutes discussed and encouraged, as a part of hypertension management. The importance of attaining a healthy weight is also discussed.  BP/Weight 06/16/2016 05/26/2016 05/14/2016 05/01/2016 04/18/2016 04/10/2016 0000000  Systolic BP 0000000 0000000 Q000111Q 123456 123456 [id number] 0000000  Diastolic BP 80 70 65 74 64 67 69  Wt. (Lbs) 141 139.5 140 139 138 136.1 137  BMI 24.2 24.32 24.41 24.62 24.45 24.11 24.27

## 2016-06-22 NOTE — Assessment & Plan Note (Signed)
Controlled , no change in management 

## 2016-06-23 ENCOUNTER — Other Ambulatory Visit (HOSPITAL_COMMUNITY): Payer: Self-pay

## 2016-06-23 ENCOUNTER — Encounter (HOSPITAL_COMMUNITY): Payer: Self-pay | Admitting: Oncology

## 2016-06-23 ENCOUNTER — Encounter (HOSPITAL_BASED_OUTPATIENT_CLINIC_OR_DEPARTMENT_OTHER): Payer: Medicare HMO | Admitting: Oncology

## 2016-06-23 ENCOUNTER — Other Ambulatory Visit (HOSPITAL_COMMUNITY): Payer: Medicare HMO

## 2016-06-23 ENCOUNTER — Encounter (HOSPITAL_COMMUNITY): Payer: Medicare HMO | Attending: Oncology

## 2016-06-23 ENCOUNTER — Ambulatory Visit (HOSPITAL_COMMUNITY): Payer: Medicare HMO | Admitting: Oncology

## 2016-06-23 ENCOUNTER — Other Ambulatory Visit (HOSPITAL_COMMUNITY): Payer: Self-pay | Admitting: Oncology

## 2016-06-23 VITALS — BP 179/67 | HR 72 | Temp 98.4°F | Resp 16 | Wt 142.3 lb

## 2016-06-23 DIAGNOSIS — F329 Major depressive disorder, single episode, unspecified: Secondary | ICD-10-CM | POA: Insufficient documentation

## 2016-06-23 DIAGNOSIS — M549 Dorsalgia, unspecified: Secondary | ICD-10-CM | POA: Diagnosis not present

## 2016-06-23 DIAGNOSIS — Z9049 Acquired absence of other specified parts of digestive tract: Secondary | ICD-10-CM | POA: Diagnosis not present

## 2016-06-23 DIAGNOSIS — Z8719 Personal history of other diseases of the digestive system: Secondary | ICD-10-CM | POA: Diagnosis not present

## 2016-06-23 DIAGNOSIS — E876 Hypokalemia: Secondary | ICD-10-CM

## 2016-06-23 DIAGNOSIS — C787 Secondary malignant neoplasm of liver and intrahepatic bile duct: Principal | ICD-10-CM

## 2016-06-23 DIAGNOSIS — Z8249 Family history of ischemic heart disease and other diseases of the circulatory system: Secondary | ICD-10-CM | POA: Insufficient documentation

## 2016-06-23 DIAGNOSIS — Z9221 Personal history of antineoplastic chemotherapy: Secondary | ICD-10-CM | POA: Diagnosis not present

## 2016-06-23 DIAGNOSIS — K219 Gastro-esophageal reflux disease without esophagitis: Secondary | ICD-10-CM | POA: Diagnosis not present

## 2016-06-23 DIAGNOSIS — I10 Essential (primary) hypertension: Secondary | ICD-10-CM | POA: Insufficient documentation

## 2016-06-23 DIAGNOSIS — Z87442 Personal history of urinary calculi: Secondary | ICD-10-CM | POA: Diagnosis not present

## 2016-06-23 DIAGNOSIS — G8929 Other chronic pain: Secondary | ICD-10-CM | POA: Diagnosis not present

## 2016-06-23 DIAGNOSIS — Z841 Family history of disorders of kidney and ureter: Secondary | ICD-10-CM | POA: Insufficient documentation

## 2016-06-23 DIAGNOSIS — F419 Anxiety disorder, unspecified: Secondary | ICD-10-CM | POA: Diagnosis not present

## 2016-06-23 DIAGNOSIS — Z66 Do not resuscitate: Secondary | ICD-10-CM | POA: Insufficient documentation

## 2016-06-23 DIAGNOSIS — Z9889 Other specified postprocedural states: Secondary | ICD-10-CM | POA: Diagnosis not present

## 2016-06-23 DIAGNOSIS — C259 Malignant neoplasm of pancreas, unspecified: Secondary | ICD-10-CM

## 2016-06-23 DIAGNOSIS — Z87891 Personal history of nicotine dependence: Secondary | ICD-10-CM | POA: Insufficient documentation

## 2016-06-23 DIAGNOSIS — R350 Frequency of micturition: Secondary | ICD-10-CM | POA: Insufficient documentation

## 2016-06-23 DIAGNOSIS — Z833 Family history of diabetes mellitus: Secondary | ICD-10-CM | POA: Insufficient documentation

## 2016-06-23 DIAGNOSIS — R69 Illness, unspecified: Secondary | ICD-10-CM | POA: Diagnosis not present

## 2016-06-23 DIAGNOSIS — R3915 Urgency of urination: Secondary | ICD-10-CM

## 2016-06-23 DIAGNOSIS — Z808 Family history of malignant neoplasm of other organs or systems: Secondary | ICD-10-CM | POA: Diagnosis not present

## 2016-06-23 DIAGNOSIS — Z801 Family history of malignant neoplasm of trachea, bronchus and lung: Secondary | ICD-10-CM | POA: Insufficient documentation

## 2016-06-23 DIAGNOSIS — N3001 Acute cystitis with hematuria: Secondary | ICD-10-CM

## 2016-06-23 LAB — COMPREHENSIVE METABOLIC PANEL
ALBUMIN: 3.6 g/dL (ref 3.5–5.0)
ALT: 23 U/L (ref 14–54)
AST: 30 U/L (ref 15–41)
Alkaline Phosphatase: 143 U/L — ABNORMAL HIGH (ref 38–126)
Anion gap: 10 (ref 5–15)
BILIRUBIN TOTAL: 0.9 mg/dL (ref 0.3–1.2)
BUN: 20 mg/dL (ref 6–20)
CO2: 25 mmol/L (ref 22–32)
Calcium: 9 mg/dL (ref 8.9–10.3)
Chloride: 103 mmol/L (ref 101–111)
Creatinine, Ser: 1 mg/dL (ref 0.44–1.00)
GFR calc Af Amer: 58 mL/min — ABNORMAL LOW (ref >60)
GFR calc non Af Amer: 50 mL/min — ABNORMAL LOW (ref >60)
Glucose, Bld: 109 mg/dL — ABNORMAL HIGH (ref 65–99)
POTASSIUM: 3.3 mmol/L — AB (ref 3.5–5.1)
Sodium: 138 mmol/L (ref 135–145)
TOTAL PROTEIN: 7 g/dL (ref 6.5–8.1)

## 2016-06-23 LAB — CBC WITH DIFFERENTIAL/PLATELET
BASOS ABS: 0 10*3/uL (ref 0.0–0.1)
BASOS PCT: 0 %
Eosinophils Absolute: 0.1 10*3/uL (ref 0.0–0.7)
Eosinophils Relative: 2 %
HEMATOCRIT: 33.9 % — AB (ref 36.0–46.0)
HEMOGLOBIN: 11.3 g/dL — AB (ref 12.0–15.0)
Lymphocytes Relative: 22 %
Lymphs Abs: 1.5 10*3/uL (ref 0.7–4.0)
MCH: 29 pg (ref 26.0–34.0)
MCHC: 33.3 g/dL (ref 30.0–36.0)
MCV: 87.1 fL (ref 78.0–100.0)
Monocytes Absolute: 0.6 10*3/uL (ref 0.1–1.0)
Monocytes Relative: 9 %
NEUTROS ABS: 4.5 10*3/uL (ref 1.7–7.7)
Neutrophils Relative %: 67 %
Platelets: 221 10*3/uL (ref 150–400)
RBC: 3.89 MIL/uL (ref 3.87–5.11)
RDW: 14.3 % (ref 11.5–15.5)
WBC: 6.6 10*3/uL (ref 4.0–10.5)

## 2016-06-23 LAB — URINALYSIS, ROUTINE W REFLEX MICROSCOPIC
Bilirubin Urine: NEGATIVE
Glucose, UA: NEGATIVE mg/dL
KETONES UR: NEGATIVE mg/dL
Nitrite: NEGATIVE
Specific Gravity, Urine: 1.016 (ref 1.005–1.030)
pH: 5 (ref 5.0–8.0)

## 2016-06-23 MED ORDER — NITROFURANTOIN MONOHYD MACRO 100 MG PO CAPS: 100.0000 mg | ORAL_CAPSULE | Freq: Two times a day (BID) | ORAL | 0 refills | Status: DC

## 2016-06-23 MED ORDER — POTASSIUM CHLORIDE CRYS ER 20 MEQ PO TBCR: 20.0000 meq | EXTENDED_RELEASE_TABLET | Freq: Two times a day (BID) | ORAL | 0 refills | Status: DC

## 2016-06-23 NOTE — Progress Notes (Addendum)
[name] Nakayama, MD [address] [place] [address]  Pancreatic cancer metastasized to liver [facility]) - Plan: acetaminophen (TYLENOL) 500 MG tablet, hydrochlorothiazide (MICROZIDE) 12.5 MG capsule, Urinalysis, Routine w reflex microscopic, Urinalysis, Routine w reflex microscopic, CBC with Differential, Comprehensive metabolic panel, Cancer antigen 19-9, CT ABDOMEN W WO CONTRAST, CBC with Differential, Comprehensive metabolic panel, Cancer antigen 19-9  Urinary urgency - Plan: acetaminophen (TYLENOL) 500 MG tablet, hydrochlorothiazide (MICROZIDE) 12.5 MG capsule, Urinalysis, Routine w reflex microscopic, Urinalysis, Routine w reflex microscopic  Hypokalemia - Plan: potassium chloride SA (K-DUR,KLOR-CON) 20 MEQ tablet  CURRENT THERAPY: Chemotherapy HOLIDAY  INTERVAL HISTORY: [name] 83 y.o. female returns for followup of Stage IV pancreatic cancer.     Pancreatic cancer metastasized to liver ([address])   03/10/2014 Initial Diagnosis    Pancreatic cancer metastasized to liver      03/22/2014 - 03/05/2016 Chemotherapy    Abraxane/Gemzar days 1, 8, every 28 days.  Day 15 was cancelled due to leukopenia and thrombocytopenia on day 15 cycle 1.      05/24/2014 Treatment Plan Change    Day 8 of cycle 3 is held with ANC of 1.1      06/05/2014 Imaging    CT C/A/P . Interval decrease in size of the pancreatic tail mass.Improved hepatic metastatic disease. No new lesions. No CT findings for metastatic disease involving the chest.      08/09/2014 Tumor Marker    CA 19-9= 33 (WNL)      10/26/2014 Imaging    MRI- Continued interval decrease in size of the hepatic metastatic lesions and no new lesions are identified. Continued decrease in size of the pancreatic tail lesion.      01/03/2015 Tumor Marker    Results for IVOR, PANDA (MRN [id number]) as of 01/18/2015 12:52  01/03/2015 10:00 CA 19-9: 14       01/17/2015 Imaging    MRI- Response to therapy of hepatic  metastasis.  Similar size of a pancreatic tail lesion.      03/20/2015 Imaging    MRI-L spine- Severe disc space narrowing at L2-L3, with endplate reactive changes. Large disc extrusion into the ventral epidural space,central to the RIGHT with a cephalad migrated free fragment. Additional Large disc extrusion into the retroperitone...      03/22/2015 Imaging    CT pelvis- No evidence for metastatic disease within the pelvis.      07/24/2015 Imaging    MRI abd- Continued response to therapy, with no residual detectable liver metastases. No new sites of metastatic disease in the abdomen.       08/15/2015 Code Status     She confirms desire for DNR status.      12/21/2015 Imaging    MRI liver- Severe image degradation due to motion artifact reducing diagnostic sensitivity and specificity. Reduce conspicuity of the pancreatic tail lesion suggesting further improvement. The original liver lesions have resolved.      03/05/2016 Treatment Plan Change    Chemotherapy holiday/Break after 2 years worth of treatment      04/07/2016 Imaging    CT chest- When compared to recent chest CT, new minimally displaced anterior left sixth rib fracture. Slight increase in subcarinal adenopathy      04/28/2016 Imaging    Bone density- BMD as determined from Femur Neck Right is 0.705 g/cm2 with a T-Score of -2.4. This patient is considered osteopenic according to [facility]) criteria. Compared with the prior study  on 02/23/2013, the BMD of the lumbar spine/rt. femoral neck show a statistically significant decrease.       05/23/2016 Imaging    MRI abd- 1. Exam is significantly degraded by patient respiratory motion. Consider follow-up exams with CT abdomen with without contrast per pancreatic protocol. 2. Fullness in tail of pancreas is more prominent and potentially increased in size. Recommend close attention on follow-up. (Consider CT as above) 3. No explanation for back  pain.      She is doing very well from an oncology standpoint. She denies any new abdominal complaints. She denies any issues with her bowels.  She does note chronic back pain. It is well managed with Tylenol. MRI report is reviewed today.  No radiographic reasoning for back pain.  She notes some urinary frequency without hematuria, pelvic pain. Urinary burning, and malodorous urine.   Review of Systems  Constitutional: Negative.  Negative for chills, fever and weight loss.  HENT: Negative.   Eyes: Negative.   Respiratory: Negative.  Negative for cough.   Cardiovascular: Negative.  Negative for chest pain.  Gastrointestinal: Negative.  Negative for abdominal pain, constipation, diarrhea, nausea and vomiting.  Genitourinary: Positive for frequency. Negative for dysuria, flank pain, hematuria and urgency.  Musculoskeletal: Negative.   Skin: Negative.   Neurological: Negative.  Negative for weakness.  Endo/Heme/Allergies: Negative.   Psychiatric/Behavioral: Negative.     Past Medical History:  Diagnosis Date  . Anemia due to antineoplastic chemotherapy 09/12/2015   Started Aranesp 500 mcg on 09/12/2015  . Anxiety   . Chronic diarrhea   . Depression   . DNR (do not resuscitate) 08/16/2015  . Erosive esophagitis   . GERD (gastroesophageal reflux disease)   . Hx of adenomatous colonic polyps    tubular adenomas, last found in 2008  . Hyperplastic colon polyp 03/19/10   tcs by Dr. Gala Romney  . Hypertension 20 years   . Kidney stone    hx/ crushed   . Opioid contract exists 04/18/2015   With Dr. Moshe Cipro  . Pancreatic cancer ([place]) 02/2014  . Pancreatic cancer metastasized to liver ([place]) 03/10/2014  . Schatzki's ring 08/26/10   Last dilated on EGD by Dr. Trevor Iha [facility], linear gastric erosions, BI hemigastrectomy    Past Surgical History:  Procedure Laterality Date  . BREAST LUMPECTOMY Left   . bunion removal     from both feet   . CHOLECYSTECTOMY  1965   . COLONOSCOPY  03/19/2010    DR Gala Romney,, normal TI, pancolonic diverticula, random colon bx neg., hyperplastic polyps removed  . ESOPHAGOGASTRODUODENOSCOPY  11/22/2003   DR Gala Romney, erosive RE, Billroth I  . ESOPHAGOGASTRODUODENOSCOPY  08/26/10   Dr. Gala Romney- moderate severe ERE, Scahtzki ring s/p dilation, Billroth I, linear gastric erosions, bx-gastric xanthelasma  . LEFT SHOULDER SURGERY  2009   DR HARRISON  . ORIF ANKLE FRACTURE Right 05/22/2013   Procedure: OPEN REDUCTION INTERNAL FIXATION (ORIF) RIGHT ANKLE FRACTURE;  Surgeon: Sanjuana Kava, MD;  Location: [facility];  Service: Orthopedics;  Laterality: Right;  . stomach ulcer  50 years ago    had some of her stomach removed     Family History  Problem Relation Age of Onset  . Hypertension Mother   . Kidney failure Brother     X1 ON DIALYSIS  . Diabetes Sister   . Cancer Sister     X2  . Hypertension Father   . Liver disease Neg Hx   . Colon cancer Neg Hx     Social  History   Social History  . Marital status: Widowed    Spouse name: N/A  . Number of children: 2  . Years of education: N/A   Occupational History  . retired from Teacher, adult education Retired   Social History Main Topics  . Smoking status: Former Smoker    Types: Cigarettes    Quit date: 03/10/2016  . Smokeless tobacco: Never Used     Comment: quit a few weeks ago   . Alcohol use No  . Drug use: No  . Sexual activity: No   Other Topics Concern  . None   Social History Narrative  . None     PHYSICAL EXAMINATION  ECOG PERFORMANCE STATUS: 1 - Symptomatic but completely ambulatory  Vitals:   06/23/16 1314  BP: (!) 179/67  Pulse: 72  Resp: 16  Temp: 98.4 F (36.9 C)    GENERAL:alert, no distress, well nourished, well developed, comfortable, cooperative, smiling and accompanied by sister. SKIN: skin color, texture, turgor are normal, no rashes or significant lesions HEAD: Normocephalic, No masses, lesions, tenderness or abnormalities EYES: normal, EOMI, Conjunctiva are pink and  non-injected EARS: External ears normal OROPHARYNX:lips, buccal mucosa, and tongue normal and mucous membranes are moist  NECK: supple, no adenopathy, thyroid normal size, non-tender, without nodularity, trachea midline LYMPH:  no palpable lymphadenopathy, no hepatosplenomegaly BREAST:not examined LUNGS: clear to auscultation and percussion HEART: regular rate & rhythm, no murmurs, no gallops, S1 normal and S2 normal ABDOMEN:abdomen soft, non-tender and normal bowel sounds BACK: Back symmetric, no curvature. EXTREMITIES:less then 2 second capillary refill, no joint deformities, effusion, or inflammation, no skin discoloration, no cyanosis  NEURO: alert & oriented x 3 with fluent speech, no focal motor/sensory deficits, gait normal   LABORATORY DATA: CBC    Component Value Date/Time   WBC 6.6 06/23/2016 1431   RBC 3.89 06/23/2016 1431   HGB 11.3 (L) 06/23/2016 1431   HCT 33.9 (L) 06/23/2016 1431   PLT 221 06/23/2016 1431   MCV 87.1 06/23/2016 1431   MCH 29.0 06/23/2016 1431   MCHC 33.3 06/23/2016 1431   RDW 14.3 06/23/2016 1431   LYMPHSABS 1.5 06/23/2016 1431   MONOABS 0.6 06/23/2016 1431   EOSABS 0.1 06/23/2016 1431   BASOSABS 0.0 06/23/2016 1431      Chemistry      Component Value Date/Time   NA 138 06/23/2016 1431   K 3.3 (L) 06/23/2016 1431   CL 103 06/23/2016 1431   CO2 25 06/23/2016 1431   BUN 20 06/23/2016 1431   CREATININE 1.00 06/23/2016 1431   CREATININE 0.97 02/14/2014 0758      Component Value Date/Time   CALCIUM 9.0 06/23/2016 1431   ALKPHOS 143 (H) 06/23/2016 1431   AST 30 06/23/2016 1431   ALT 23 06/23/2016 1431   BILITOT 0.9 06/23/2016 1431        PENDING LABS:   RADIOGRAPHIC STUDIES:  No results found.   PATHOLOGY:    ASSESSMENT AND PLAN:  Pancreatic cancer metastasized to liver Stage IV Pancreatic Cancer, on chemotherapy holiday.  Oncology history is updated.   Patient has entered into a pain contract with her primary care  provider for non-oncologic pain.  She has been followed by Dr. Luna Glasgow (Ortho) for her knee and ankle pain.    Labs today: CBC diff, CMET, CA 19-9.  I personally reviewed and went over laboratory results with the patient.  The results are noted within this dictation.  Hypokalemia is noted.  Kdur x 1 month is escribed.  Likely secondary to her antihypertensive medication(s).  Will defer further management and follow-up to PCP.  Labs in 2 months: CBC diff, CMET, CA19-9.    I personally reviewed and went over radiographic studies with the patient.  The results are noted within this dictation.  MRI abdomen demonstrates slight pre-dominance of pancreatic tail fullness, increase of 6 mm.  Repeat imaging with CT abd per pancreatic protocol is ordered.  DNR  She continues with back pain.  I have ordered a UA with reflex given her additional complaint of urinary frequency.  Back pain is well controlled with current regimen.  Return in 8 weeks for follow-up.   ORDERS PLACED FOR THIS ENCOUNTER: Orders Placed This Encounter  Procedures  . CT ABDOMEN W WO CONTRAST  . Urinalysis, Routine w reflex microscopic  . CBC with Differential  . Comprehensive metabolic panel  . Cancer antigen 19-9  . CBC with Differential  . Comprehensive metabolic panel  . Cancer antigen 19-9    MEDICATIONS PRESCRIBED THIS ENCOUNTER: Meds ordered this encounter  Medications  . acetaminophen (TYLENOL) 500 MG tablet    Sig: Take 500 mg by mouth every 6 (six) hours as needed.  . hydrochlorothiazide (MICROZIDE) 12.5 MG capsule    Sig: Take 6.25 mg by mouth 2 (two) times daily.  . potassium chloride SA (K-DUR,KLOR-CON) 20 MEQ tablet    Sig: Take 1 tablet (20 mEq total) by mouth 2 (two) times daily.    Dispense:  30 tablet    Refill:  0    Order Specific Question:   Supervising Provider    Answer:   Brunetta Genera [id number]    THERAPY PLAN:  Continue chemotherapy holiday.  All questions were answered. The  patient knows to call the clinic with any problems, questions or concerns. We can certainly see the patient much sooner if necessary.  Patient and plan discussed with Dr. Twana First and she is in agreement with the aforementioned.   This note is electronically signed by: Doy Mince 06/23/2016 3:29 PM

## 2016-06-23 NOTE — Addendum Note (Signed)
Addended by: Baird [facility] on: 06/23/2016 02:30 PM   Modules accepted: Orders

## 2016-06-23 NOTE — Addendum Note (Signed)
Addended by: Baird Cancer on: 06/23/2016 03:29 PM   Modules accepted: Orders

## 2016-06-23 NOTE — Assessment & Plan Note (Addendum)
Stage IV Pancreatic Cancer, on chemotherapy holiday.  Oncology history is updated.   Patient has entered into a pain contract with her primary care provider for non-oncologic pain.  She has been followed by Dr. Luna Glasgow (Ortho) for her knee and ankle pain.    Labs today: CBC diff, CMET, CA 19-9.  I personally reviewed and went over laboratory results with the patient.  The results are noted within this dictation.  Hypokalemia is noted.  Kdur x 1 month is escribed.  Likely secondary to her antihypertensive medication(s).  Will defer further management and follow-up to PCP.  Labs in 2 months: CBC diff, CMET, CA19-9.    I personally reviewed and went over radiographic studies with the patient.  The results are noted within this dictation.  MRI abdomen demonstrates slight pre-dominance of pancreatic tail fullness, increase of 6 mm.  Repeat imaging with CT abd per pancreatic protocol is ordered.  DNR  She continues with back pain.  I have ordered a UA with reflex given her additional complaint of urinary frequency.  Back pain is well controlled with current regimen.  Return in 8 weeks for follow-up.

## 2016-06-23 NOTE — Patient Instructions (Signed)
[place] at [facility] Discharge Instructions  RECOMMENDATIONS MADE BY THE CONSULTANT AND ANY TEST RESULTS WILL BE SENT TO YOUR REFERRING PHYSICIAN.  You were seen today by Kirby Crigler PA-C. Urine done today. Return in 2 months for labs, CT and follow up.   Thank you for choosing [place] at [facility] to provide your oncology and hematology care.  To afford each patient quality time with our provider, please arrive at least 15 minutes before your scheduled appointment time.    If you have a lab appointment with the [facility] please come in thru the  Main Entrance and check in at the main information desk  You need to re-schedule your appointment should you arrive 10 or more minutes late.  We strive to give you quality time with our providers, and arriving late affects you and other patients whose appointments are after yours.  Also, if you no show three or more times for appointments you may be dismissed from the clinic at the providers discretion.     Again, thank you for choosing [facility].  Our hope is that these requests will decrease the amount of time that you wait before being seen by our physicians.       _____________________________________________________________  Should you have questions after your visit to [facility], please contact our office at [phone] between the hours of 8:30 a.m. and 4:30 p.m.  Voicemails left after 4:30 p.m. will not be returned until the following business day.  For prescription refill requests, have your pharmacy contact our office.       Resources For Cancer Patients and their Caregivers ? American Cancer Society: Can assist with transportation, wigs, general needs, runs Look Good Feel Better.        865-521-5176 ? Cancer Care: Provides financial assistance, online support groups, medication/co-pay assistance.  1-800-813-HOPE 9341492985) ? [facility] Assists [place] cancer patients and their families through emotional , educational and financial support.  (757) 587-0086 ? [place] DSS Where to apply for food stamps, Medicaid and utility assistance. (386)488-9952 ? [employer]: Transportation to medical appointments. 509-556-9231 ? Social Security Administration: May apply for disability if have a Stage IV cancer. 4305477181 (718)077-3214 ? [employer], Disability and Transit Services: Assists with nutrition, care and transit needs. [place] Support Programs: @10RELATIVEDAYS @ > Cancer Support Group  2nd Tuesday of the month 1pm-2pm, Journey Room  > Creative Journey  3rd Tuesday of the month 1130am-1pm, Journey Room  > Look Good Feel Better  1st Wednesday of the month 10am-12 noon, Journey Room (Call [place] to register [phone])

## 2016-06-24 LAB — CANCER ANTIGEN 19-9: CA 19-9: 149 U/mL — ABNORMAL HIGH (ref 0–35)

## 2016-08-06 ENCOUNTER — Other Ambulatory Visit: Payer: Self-pay | Admitting: Family Medicine

## 2016-08-06 ENCOUNTER — Telehealth: Payer: Self-pay

## 2016-08-06 MED ORDER — TRAMADOL HCL 50 MG PO TABS: 50.0000 mg | ORAL_TABLET | Freq: Two times a day (BID) | ORAL | 0 refills | Status: DC | PRN

## 2016-08-06 NOTE — Telephone Encounter (Signed)
Tramadol printed, pls fax and let her know

## 2016-08-06 NOTE — Telephone Encounter (Signed)
Patient aware.

## 2016-08-06 NOTE — Telephone Encounter (Signed)
Has called and states she is hurting terribly in her back and cannot stand it. Wants something done. Please advise

## 2016-08-15 ENCOUNTER — Telehealth (HOSPITAL_COMMUNITY): Payer: Self-pay | Admitting: Oncology

## 2016-08-15 NOTE — Telephone Encounter (Signed)
Called pt to remind her to pick up her Prep for scans next week

## 2016-08-18 ENCOUNTER — Other Ambulatory Visit (HOSPITAL_COMMUNITY): Payer: Self-pay | Admitting: Oncology

## 2016-08-18 ENCOUNTER — Other Ambulatory Visit: Payer: Self-pay | Admitting: Family Medicine

## 2016-08-18 ENCOUNTER — Telehealth: Payer: Self-pay | Admitting: Family Medicine

## 2016-08-18 DIAGNOSIS — C259 Malignant neoplasm of pancreas, unspecified: Secondary | ICD-10-CM

## 2016-08-18 DIAGNOSIS — C787 Secondary malignant neoplasm of liver and intrahepatic bile duct: Principal | ICD-10-CM

## 2016-08-18 NOTE — Telephone Encounter (Signed)
Please advise what you would like to order for pts pain.

## 2016-08-18 NOTE — Telephone Encounter (Signed)
Pls call pt and let her know she was prescribed 2 weeks of pain medication 1 week ago, it can be refilled when due. If this is  Not sufficient for her , I recommend her oncologist or orthopedic  re evaluate her pain . She had been on hydrocodone at one time and that was so strong for her that she was reportedly waving her cane in the air instead of using it for support Let her know  recommend topical rub to joints where she hurts (like biofreeze)

## 2016-08-18 NOTE — Telephone Encounter (Signed)
Refill is too early, needs pain eval by ortho or oncology

## 2016-08-18 NOTE — Telephone Encounter (Signed)
Attempted to call pt, memory was full on answering machine.

## 2016-08-20 ENCOUNTER — Other Ambulatory Visit: Payer: Self-pay | Admitting: Family Medicine

## 2016-08-20 ENCOUNTER — Telehealth: Payer: Self-pay

## 2016-08-20 MED ORDER — TRAMADOL HCL 50 MG PO TABS: ORAL_TABLET | ORAL | 1 refills | Status: DC

## 2016-08-20 NOTE — Telephone Encounter (Signed)
Message left that med was sent in  

## 2016-08-20 NOTE — Telephone Encounter (Signed)
She left a message that the tramadol is really helping her pain now and she would like a refill sent to [facility].

## 2016-08-20 NOTE — Telephone Encounter (Signed)
Script printed and signed pls send in and advise patient

## 2016-08-21 ENCOUNTER — Encounter (HOSPITAL_COMMUNITY): Payer: Self-pay

## 2016-08-21 ENCOUNTER — Ambulatory Visit (HOSPITAL_COMMUNITY)
Admission: RE | Admit: 2016-08-21 | Discharge: 2016-08-21 | Disposition: A | Discharge status: Home / Self Care | Payer: Medicare HMO | Source: Ambulatory Visit | Attending: Oncology | Admitting: Oncology

## 2016-08-21 ENCOUNTER — Other Ambulatory Visit (HOSPITAL_COMMUNITY): Payer: Self-pay | Admitting: Oncology

## 2016-08-21 DIAGNOSIS — C787 Secondary malignant neoplasm of liver and intrahepatic bile duct: Principal | ICD-10-CM

## 2016-08-21 DIAGNOSIS — C259 Malignant neoplasm of pancreas, unspecified: Secondary | ICD-10-CM

## 2016-08-25 ENCOUNTER — Ambulatory Visit (HOSPITAL_COMMUNITY): Payer: Medicare HMO | Admitting: Oncology

## 2016-08-25 ENCOUNTER — Other Ambulatory Visit (HOSPITAL_COMMUNITY): Payer: Medicare HMO

## 2016-08-27 ENCOUNTER — Ambulatory Visit (HOSPITAL_COMMUNITY): Payer: Medicare HMO

## 2016-08-28 ENCOUNTER — Ambulatory Visit (INDEPENDENT_AMBULATORY_CARE_PROVIDER_SITE_OTHER): Payer: Medicare HMO | Admitting: Family Medicine

## 2016-08-28 ENCOUNTER — Encounter (HOSPITAL_COMMUNITY): Payer: Medicare HMO | Attending: Oncology

## 2016-08-28 ENCOUNTER — Ambulatory Visit (HOSPITAL_COMMUNITY): Payer: Medicare HMO | Admitting: Oncology

## 2016-08-28 ENCOUNTER — Encounter: Payer: Self-pay | Admitting: Family Medicine

## 2016-08-28 ENCOUNTER — Other Ambulatory Visit (HOSPITAL_COMMUNITY): Payer: Medicare HMO

## 2016-08-28 VITALS — BP 120/68 | HR 60 | Temp 97.3°F | Resp 18 | Ht 64.0 in | Wt 134.1 lb

## 2016-08-28 DIAGNOSIS — C259 Malignant neoplasm of pancreas, unspecified: Secondary | ICD-10-CM | POA: Insufficient documentation

## 2016-08-28 DIAGNOSIS — C787 Secondary malignant neoplasm of liver and intrahepatic bile duct: Secondary | ICD-10-CM | POA: Insufficient documentation

## 2016-08-28 DIAGNOSIS — R600 Localized edema: Secondary | ICD-10-CM | POA: Diagnosis not present

## 2016-08-28 DIAGNOSIS — M25561 Pain in right knee: Secondary | ICD-10-CM | POA: Diagnosis not present

## 2016-08-28 DIAGNOSIS — G8929 Other chronic pain: Secondary | ICD-10-CM | POA: Diagnosis not present

## 2016-08-28 LAB — CBC WITH DIFFERENTIAL/PLATELET
BASOS ABS: 0 10*3/uL (ref 0.0–0.1)
Basophils Relative: 0 %
EOS PCT: 1 %
Eosinophils Absolute: 0.1 10*3/uL (ref 0.0–0.7)
HCT: 36.5 % (ref 36.0–46.0)
HEMOGLOBIN: 12.4 g/dL (ref 12.0–15.0)
LYMPHS ABS: 1.6 10*3/uL (ref 0.7–4.0)
Lymphocytes Relative: 23 %
MCH: 28.6 pg (ref 26.0–34.0)
MCHC: 34 g/dL (ref 30.0–36.0)
MCV: 84.1 fL (ref 78.0–100.0)
MONO ABS: 0.4 10*3/uL (ref 0.1–1.0)
Monocytes Relative: 6 %
NEUTROS ABS: 4.7 10*3/uL (ref 1.7–7.7)
Neutrophils Relative %: 70 %
PLATELETS: 167 10*3/uL (ref 150–400)
RBC: 4.34 MIL/uL (ref 3.87–5.11)
RDW: 14.6 % (ref 11.5–15.5)
WBC: 6.8 10*3/uL (ref 4.0–10.5)

## 2016-08-28 LAB — COMPREHENSIVE METABOLIC PANEL
ALT: 14 U/L (ref 14–54)
AST: 23 U/L (ref 15–41)
Albumin: 3.7 g/dL (ref 3.5–5.0)
Alkaline Phosphatase: 142 U/L — ABNORMAL HIGH (ref 38–126)
Anion gap: 8 (ref 5–15)
BUN: 17 mg/dL (ref 6–20)
CHLORIDE: 102 mmol/L (ref 101–111)
CO2: 30 mmol/L (ref 22–32)
CREATININE: 1.09 mg/dL — AB (ref 0.44–1.00)
Calcium: 9.4 mg/dL (ref 8.9–10.3)
GFR calc non Af Amer: 45 mL/min — ABNORMAL LOW (ref >60)
GFR, EST AFRICAN AMERICAN: 52 mL/min — AB (ref >60)
Glucose, Bld: 122 mg/dL — ABNORMAL HIGH (ref 65–99)
Potassium: 3.7 mmol/L (ref 3.5–5.1)
Sodium: 140 mmol/L (ref 135–145)
Total Bilirubin: 0.9 mg/dL (ref 0.3–1.2)
Total Protein: 7.4 g/dL (ref 6.5–8.1)

## 2016-08-28 MED ORDER — PREDNISONE 20 MG PO TABS
20.0000 mg | ORAL_TABLET | Freq: Every day | ORAL | 0 refills | Status: DC
Start: 2016-08-28 — End: 2016-10-05

## 2016-08-28 NOTE — Patient Instructions (Signed)
Take the prednisone medicine once a day for the knee pain Drink plenty of water Try to reduce salt it your diet Consider adding ensure or boost nutrition supplement once a day to get more calories and protein Lab tests today I will notify you of your test results

## 2016-08-28 NOTE — Progress Notes (Signed)
Chief Complaint  Patient presents with  . Foot Swelling   Usual PCP is [name] M.D. I'm seeing her for an acute visit. She is also under the care of the oncology department for metastatic pancreatic cancer. She is here today complaining of ankle swelling. Worse towards the end day. It is somewhat uncomfortable. No shortness of breath. It's new over the last couple of weeks. In addition she is complaining of right knee pain. She has known arthritis in this knee. It swells and feels stiff. She has difficulty climbing in and out of bed when her knee is bothering her. She has had no fall or injury. She is currently on a chemotherapy holiday. She has lost 7 pounds since she was last seen. She states her appetite is poor. When last checked her CA 19.9 was rising. She continues to live independently. She has very little pain. Other than a poor appetite she does not feel poorly. She does spend most the day napping. She has a sister who lives next door that checks on her, does housecleaning and most of her cooking. Her pain management is good on tramadol twice a day.  Patient Active Problem List   Diagnosis Date Noted  . At high risk for falls 09/22/2015  . Anemia due to antineoplastic chemotherapy 09/12/2015  . DNR (do not resuscitate) 08/16/2015  . Chronic low back pain with sciatica 06/03/2015  . Hx of adenomatous colonic polyps 04/24/2015  . Right knee pain 04/14/2015  . Allergic rhinitis 12/12/2014  . Pancreatic cancer metastasized to liver ([place]) 03/10/2014  . Abnormal liver ultrasound 02/27/2014  . Anemia, iron deficiency 11/03/2013  . IGT (impaired glucose tolerance) 02/19/2013  . Primary generalized (osteo)arthritis 05/12/2012  . Anxiety 09/15/2011  . GERD (gastroesophageal reflux disease) 08/20/2010  . Essential hypertension 01/07/2010    Outpatient Encounter Prescriptions as of 08/28/2016  Medication Sig  . acetaminophen (TYLENOL) 500 MG tablet Take 500 mg by mouth every  6 (six) hours as needed.  Marland Kitchen amLODipine (NORVASC) 10 MG tablet Take 1 tablet (10 mg total) by mouth daily.  . Calcium-Magnesium-Vitamin D (CALCIUM 1200+D3 PO) Take 2 tablets by mouth daily.  . clonazePAM (KLONOPIN) 0.5 MG tablet Take 1 tablet (0.5 mg total) by mouth 2 (two) times daily as needed. for anxiety  . cycloSPORINE (RESTASIS) 0.05 % ophthalmic emulsion Place 1 drop into both eyes 2 (two) times daily.  . ferrous sulfate 325 (65 FE) MG tablet Take 325 mg by mouth daily with breakfast.  . hydrochlorothiazide (MICROZIDE) 12.5 MG capsule Take 6.25 mg by mouth 2 (two) times daily.  Marland Kitchen lidocaine-prilocaine (EMLA) cream APPLY A QUARTER SIZE AMOUNT TO PORT SITE 1 HOURS PRIOR TO CHEMO DO NOT RUB IN COVER WITH PLASTIC WRAP  . metoprolol tartrate (LOPRESSOR) 25 MG tablet Take 0.5 tablets (12.5 mg total) by mouth 2 (two) times daily.  . montelukast (SINGULAIR) 10 MG tablet Take 1 tablet (10 mg total) by mouth at bedtime.  . pantoprazole (PROTONIX) 20 MG tablet TAKE 1 TABLET(20 MG) BY MOUTH DAILY  . Potassium 99 MG TABS Take 1 tablet by mouth daily.  . potassium chloride SA (K-DUR,KLOR-CON) 20 MEQ tablet Take 1 tablet (20 mEq total) by mouth 2 (two) times daily.  . Probiotic Product (HEALTHY COLON PO) Take 1 tablet by mouth every morning.   . traMADol (ULTRAM) 50 MG tablet One tablet twice daily as needed for pain  . predniSONE (DELTASONE) 20 MG tablet Take 1 tablet (20 mg total) by mouth  daily with breakfast.   Facility-Administered Encounter Medications as of 08/28/2016  Medication  . dexamethasone (DECADRON) 10 mg in sodium chloride 0.9 % 50 mL IVPB    Allergies  Allergen Reactions  . Iohexol Hives and Shortness Of Breath    PATIENT HAD TO BE TAKEN TO ED, C/O WHELPS, HIVES, DIFFICULTY BREATHING  . Aciphex [Rabeprazole Sodium] Other (See Comments)    unknown  . Amlodipine Besylate-Valsartan     Rash to high-dose (5/320)  . Esomeprazole Magnesium   . Omeprazole Other (See Comments)     Patient states "medication didn't work"  . Penicillins Other (See Comments)    unknown  . Ciprofloxacin Rash    Review of Systems  Constitutional: Positive for activity change, appetite change and unexpected weight change.       Decreased activity and appetite. Weight loss  HENT: Negative for congestion and dental problem.   Eyes: Negative for photophobia and visual disturbance.  Respiratory: Negative for cough and chest tightness.   Cardiovascular: Positive for leg swelling. Negative for chest pain and palpitations.  Gastrointestinal: Negative for abdominal distention, abdominal pain and constipation.  Genitourinary: Negative for difficulty urinating.  Musculoskeletal: Positive for back pain, gait problem and joint swelling.       Back pain, chronic. Right knee pain intermittent  Neurological: Positive for weakness.  Hematological: Bruises/bleeds easily.  Psychiatric/Behavioral: Negative for dysphoric mood and sleep disturbance. The patient is not nervous/anxious.    BP 120/68 (BP Location: Right Arm, Patient Position: Sitting, Cuff Size: Normal)   Pulse 60   Temp 97.3 F (36.3 C) (Temporal)   Resp 18   Ht 5\' 4"  (1.626 m)   Wt 134 lb 1.3 oz (60.8 kg)   SpO2 96%   BMI 23.01 kg/m   Physical Exam  Constitutional: She is oriented to person, place, and time. She appears well-developed and well-nourished.  Patient is well groomed and on time for her visit. She is pleasant and cooperative, in good spirits.  HENT:  Head: Normocephalic and atraumatic.  Mouth/Throat: Oropharynx is clear and moist.  Cardiovascular: Normal rate, regular rhythm and normal heart sounds.   Pulmonary/Chest: Breath sounds normal. She is in respiratory distress.  Abdominal: Soft. Bowel sounds are normal. There is no tenderness.  Musculoskeletal: Normal range of motion. She exhibits edema.  Trace pitting edema at ankle. Right knee has effusion and warmth and crepitus with no instability  Neurological: She is  alert and oriented to person, place, and time.  Psychiatric: She has a normal mood and affect. Her behavior is normal.    ASSESSMENT/PLAN:  1. Pedal edema We'll check lab work. Suspect she might have a low albumin.  2. Chronic pain of right knee Prednisone for 2 weeks.  3. Pancreatic cancer metastasized to liver ([place]) Progressive. Under care of oncology.   Patient Instructions  Take the prednisone medicine once a day for the knee pain Drink plenty of water Try to reduce salt it your diet Consider adding ensure or boost nutrition supplement once a day to get more calories and protein Lab tests today I will notify you of your test results   Raylene Everts, MD

## 2016-08-29 LAB — CANCER ANTIGEN 19-9: CA 19 9: 463 U/mL — AB (ref 0–35)

## 2016-09-11 ENCOUNTER — Ambulatory Visit (HOSPITAL_COMMUNITY)
Admission: RE | Admit: 2016-09-11 | Discharge: 2016-09-11 | Disposition: A | Discharge status: Home / Self Care | Payer: Medicare HMO | Source: Ambulatory Visit | Attending: Oncology | Admitting: Oncology

## 2016-09-11 ENCOUNTER — Ambulatory Visit (HOSPITAL_COMMUNITY): Payer: Medicare HMO

## 2016-09-11 ENCOUNTER — Other Ambulatory Visit (HOSPITAL_COMMUNITY): Payer: Self-pay | Admitting: Oncology

## 2016-09-11 DIAGNOSIS — C787 Secondary malignant neoplasm of liver and intrahepatic bile duct: Principal | ICD-10-CM

## 2016-09-11 DIAGNOSIS — C259 Malignant neoplasm of pancreas, unspecified: Secondary | ICD-10-CM | POA: Diagnosis not present

## 2016-09-11 DIAGNOSIS — Q6102 Congenital multiple renal cysts: Secondary | ICD-10-CM | POA: Insufficient documentation

## 2016-09-11 DIAGNOSIS — D7389 Other diseases of spleen: Secondary | ICD-10-CM | POA: Insufficient documentation

## 2016-09-11 MED ORDER — GADOBENATE DIMEGLUMINE 529 MG/ML IV SOLN
12.0000 mL | Freq: Once | INTRAVENOUS | Status: AC | PRN
  Administered 2016-09-11: 12 mL via INTRAVENOUS

## 2016-09-16 ENCOUNTER — Encounter (HOSPITAL_COMMUNITY): Payer: Medicare HMO | Attending: Oncology | Admitting: Adult Health

## 2016-09-16 ENCOUNTER — Encounter (HOSPITAL_COMMUNITY): Payer: Self-pay | Admitting: Adult Health

## 2016-09-16 VITALS — BP 136/63 | HR 56 | Temp 98.1°F | Resp 16 | Ht 64.0 in | Wt 134.5 lb

## 2016-09-16 DIAGNOSIS — K219 Gastro-esophageal reflux disease without esophagitis: Secondary | ICD-10-CM | POA: Insufficient documentation

## 2016-09-16 DIAGNOSIS — Z87891 Personal history of nicotine dependence: Secondary | ICD-10-CM | POA: Insufficient documentation

## 2016-09-16 DIAGNOSIS — I10 Essential (primary) hypertension: Secondary | ICD-10-CM | POA: Insufficient documentation

## 2016-09-16 DIAGNOSIS — Z66 Do not resuscitate: Secondary | ICD-10-CM | POA: Insufficient documentation

## 2016-09-16 DIAGNOSIS — E876 Hypokalemia: Secondary | ICD-10-CM | POA: Diagnosis not present

## 2016-09-16 DIAGNOSIS — F329 Major depressive disorder, single episode, unspecified: Secondary | ICD-10-CM | POA: Diagnosis not present

## 2016-09-16 DIAGNOSIS — F419 Anxiety disorder, unspecified: Secondary | ICD-10-CM | POA: Diagnosis not present

## 2016-09-16 DIAGNOSIS — Z7189 Other specified counseling: Secondary | ICD-10-CM

## 2016-09-16 DIAGNOSIS — Z9889 Other specified postprocedural states: Secondary | ICD-10-CM | POA: Insufficient documentation

## 2016-09-16 DIAGNOSIS — Z9049 Acquired absence of other specified parts of digestive tract: Secondary | ICD-10-CM | POA: Insufficient documentation

## 2016-09-16 DIAGNOSIS — Z833 Family history of diabetes mellitus: Secondary | ICD-10-CM | POA: Diagnosis not present

## 2016-09-16 DIAGNOSIS — Z8249 Family history of ischemic heart disease and other diseases of the circulatory system: Secondary | ICD-10-CM | POA: Insufficient documentation

## 2016-09-16 DIAGNOSIS — R29898 Other symptoms and signs involving the musculoskeletal system: Secondary | ICD-10-CM | POA: Diagnosis not present

## 2016-09-16 DIAGNOSIS — R350 Frequency of micturition: Secondary | ICD-10-CM | POA: Insufficient documentation

## 2016-09-16 DIAGNOSIS — C259 Malignant neoplasm of pancreas, unspecified: Secondary | ICD-10-CM | POA: Diagnosis not present

## 2016-09-16 DIAGNOSIS — M545 Low back pain: Secondary | ICD-10-CM | POA: Diagnosis not present

## 2016-09-16 DIAGNOSIS — Z841 Family history of disorders of kidney and ureter: Secondary | ICD-10-CM | POA: Insufficient documentation

## 2016-09-16 DIAGNOSIS — Z808 Family history of malignant neoplasm of other organs or systems: Secondary | ICD-10-CM | POA: Diagnosis not present

## 2016-09-16 DIAGNOSIS — Z87442 Personal history of urinary calculi: Secondary | ICD-10-CM | POA: Insufficient documentation

## 2016-09-16 DIAGNOSIS — C787 Secondary malignant neoplasm of liver and intrahepatic bile duct: Secondary | ICD-10-CM | POA: Diagnosis not present

## 2016-09-16 DIAGNOSIS — M549 Dorsalgia, unspecified: Secondary | ICD-10-CM | POA: Diagnosis not present

## 2016-09-16 DIAGNOSIS — R69 Illness, unspecified: Secondary | ICD-10-CM | POA: Diagnosis not present

## 2016-09-16 DIAGNOSIS — Z9221 Personal history of antineoplastic chemotherapy: Secondary | ICD-10-CM | POA: Insufficient documentation

## 2016-09-16 DIAGNOSIS — R3915 Urgency of urination: Secondary | ICD-10-CM | POA: Diagnosis not present

## 2016-09-16 DIAGNOSIS — G8929 Other chronic pain: Secondary | ICD-10-CM | POA: Diagnosis not present

## 2016-09-16 DIAGNOSIS — Z801 Family history of malignant neoplasm of trachea, bronchus and lung: Secondary | ICD-10-CM | POA: Diagnosis not present

## 2016-09-16 DIAGNOSIS — Z8719 Personal history of other diseases of the digestive system: Secondary | ICD-10-CM | POA: Insufficient documentation

## 2016-09-16 LAB — URINALYSIS, ROUTINE W REFLEX MICROSCOPIC
BACTERIA UA: NONE SEEN
Bilirubin Urine: NEGATIVE
GLUCOSE, UA: NEGATIVE mg/dL
KETONES UR: NEGATIVE mg/dL
Leukocytes, UA: NEGATIVE
NITRITE: NEGATIVE
Specific Gravity, Urine: 1.016 (ref 1.005–1.030)
pH: 5 (ref 5.0–8.0)

## 2016-09-16 NOTE — Progress Notes (Signed)
[place], [place] [address]   CLINIC:  Medical Oncology/Hematology  PCP:  Fayrene Helper, MD [address], [place] [address] [phone]   REASON FOR VISIT:  Follow-up for Stage IV pancreatic cancer with liver mets   CURRENT THERAPY: Chemotherapy drug holiday at present; last chemo Gemzar/Abraxane 03/05/16   BRIEF ONCOLOGIC HISTORY:    Pancreatic cancer metastasized to liver ([place])   03/10/2014 Initial Diagnosis    Pancreatic cancer metastasized to liver      03/22/2014 - 03/05/2016 Chemotherapy    Abraxane/Gemzar days 1, 8, every 28 days.  Day 15 was cancelled due to leukopenia and thrombocytopenia on day 15 cycle 1.      05/24/2014 Treatment Plan Change    Day 8 of cycle 3 is held with ANC of 1.1      06/05/2014 Imaging    CT C/A/P . Interval decrease in size of the pancreatic tail mass.Improved hepatic metastatic disease. No new lesions. No CT findings for metastatic disease involving the chest.      08/09/2014 Tumor Marker    CA 19-9= 33 (WNL)      10/26/2014 Imaging    MRI- Continued interval decrease in size of the hepatic metastatic lesions and no new lesions are identified. Continued decrease in size of the pancreatic tail lesion.      01/03/2015 Tumor Marker    Results for KASAUNDRA, FAHRNEY (MRN [id number]) as of 01/18/2015 12:52  01/03/2015 10:00 CA 19-9: 14       01/17/2015 Imaging    MRI- Response to therapy of hepatic metastasis.  Similar size of a pancreatic tail lesion.      03/20/2015 Imaging    MRI-L spine- Severe disc space narrowing at L2-L3, with endplate reactive changes. Large disc extrusion into the ventral epidural space,central to the RIGHT with a cephalad migrated free fragment. Additional Large disc extrusion into the retroperitone...      03/22/2015 Imaging    CT pelvis- No evidence for metastatic disease within the pelvis.      07/24/2015 Imaging    MRI abd- Continued response to  therapy, with no residual detectable liver metastases. No new sites of metastatic disease in the abdomen.       08/15/2015 Code Status     She confirms desire for DNR status.      12/21/2015 Imaging    MRI liver- Severe image degradation due to motion artifact reducing diagnostic sensitivity and specificity. Reduce conspicuity of the pancreatic tail lesion suggesting further improvement. The original liver lesions have resolved.      03/05/2016 Treatment Plan Change    Chemotherapy holiday/Break after 2 years worth of treatment      04/07/2016 Imaging    CT chest- When compared to recent chest CT, new minimally displaced anterior left sixth rib fracture. Slight increase in subcarinal adenopathy      04/28/2016 Imaging    Bone density- BMD as determined from Femur Neck Right is 0.705 g/cm2 with a T-Score of -2.4. This patient is considered osteopenic according to [facility]) criteria. Compared with the prior study on 02/23/2013, the BMD of the lumbar spine/rt. femoral neck show a statistically significant decrease.       05/23/2016 Imaging    MRI abd- 1. Exam is significantly degraded by patient respiratory motion. Consider follow-up exams with CT abdomen with without contrast per pancreatic protocol. 2. Fullness in tail of pancreas is more prominent and potentially  increased in size. Recommend close attention on follow-up. (Consider CT as above) 3. No explanation for back pain.      09/11/2016 Imaging    MRI pancreas- Interval progression of pancreatic tail lesion. New differential perfusion right liver with areas of heterogeneity. Underlying metastatic disease in this region not excluded.      09/11/2016 Progression    MRi in conjunction with rising CA 19-9 are indicative of relapse of disease.        INTERVAL HISTORY:  Ms. Gatz 83 y.o. female returns for routine follow-up for metastatic pancreatic cancer, currently on chemotherapy drug holiday.    Overall, Ms. Vargo tells me she has been feeling pretty well. Endorses fatigue, with energy levels 25%. Appetite also reportedly 25%; she has lost about 8 lbs in the past 3 months.    Reports recent fall about 3-4 weeks ago where she cracked a rib on the left side. Denies that this fall was secondary to syncope; denies hitting her head.  She is wearing an abdominal binder today, which provides her some additional support for the rib pain which is improved per her report. Denies any pain today.  She has chronic low back pain, that radiates down her right leg at times with muscle cramps; she also has intermittent right leg weakness/numbness.  She does not think she has ever seen an orthopedic specialist for these concerns.   She shares with me that she had a UTI about 2 months ago; she is concerned she may have another infection because she continues to have urinary urgency and intermittent bladder/suprapubic pain.  Denies dysuria or hematuria.  Denies fever/chills. Her bowels are moving well with no complaints.     REVIEW OF SYSTEMS:  Review of Systems  Constitutional: Positive for appetite change, fatigue and unexpected weight change. Negative for chills and fever.  HENT:  Negative.  Negative for lump/mass and nosebleeds.   Eyes: Negative.   Respiratory: Negative.  Negative for cough and shortness of breath.   Cardiovascular: Negative.  Negative for chest pain and leg swelling.  Gastrointestinal: Negative.  Negative for abdominal pain, blood in stool, constipation, diarrhea, nausea and vomiting.  Endocrine: Negative.   Genitourinary: Negative.  Negative for dysuria and hematuria.        Urinary urgency; suprapubic/bladder pain at times  Musculoskeletal: Positive for arthralgias, back pain and myalgias.  Skin: Negative.  Negative for rash.  Neurological: Positive for extremity weakness and numbness. Negative for dizziness and headaches.  Hematological: Negative.  Negative for adenopathy.  Does not bruise/bleed easily.  Psychiatric/Behavioral: Negative.  Negative for depression and sleep disturbance. The patient is not nervous/anxious.      PAST MEDICAL/SURGICAL HISTORY:  Past Medical History:  Diagnosis Date  . Anemia due to antineoplastic chemotherapy 09/12/2015   Started Aranesp 500 mcg on 09/12/2015  . Anxiety   . Chronic diarrhea   . Depression   . DNR (do not resuscitate) 08/16/2015  . Erosive esophagitis   . GERD (gastroesophageal reflux disease)   . Hx of adenomatous colonic polyps    tubular adenomas, last found in 2008  . Hyperplastic colon polyp 03/19/10   tcs by Dr. Gala Romney  . Hypertension 20 years   . Kidney stone    hx/ crushed   . Opioid contract exists 04/18/2015   With Dr. Moshe Cipro  . Pancreatic cancer ([place]) 02/2014  . Pancreatic cancer metastasized to liver ([place]) 03/10/2014  . Schatzki's ring 08/26/10   Last dilated on EGD by Dr. Trevor Iha [facility], linear  gastric erosions, BI hemigastrectomy   Past Surgical History:  Procedure Laterality Date  . BREAST LUMPECTOMY Left   . bunion removal     from both feet   . CHOLECYSTECTOMY  1965   . COLONOSCOPY  03/19/2010   DR Gala Romney,, normal TI, pancolonic diverticula, random colon bx neg., hyperplastic polyps removed  . ESOPHAGOGASTRODUODENOSCOPY  11/22/2003   DR Gala Romney, erosive RE, Billroth I  . ESOPHAGOGASTRODUODENOSCOPY  08/26/10   Dr. Gala Romney- moderate severe ERE, Scahtzki ring s/p dilation, Billroth I, linear gastric erosions, bx-gastric xanthelasma  . LEFT SHOULDER SURGERY  2009   DR HARRISON  . ORIF ANKLE FRACTURE Right 05/22/2013   Procedure: OPEN REDUCTION INTERNAL FIXATION (ORIF) RIGHT ANKLE FRACTURE;  Surgeon: Sanjuana Kava, MD;  Location: [facility];  Service: Orthopedics;  Laterality: Right;  . stomach ulcer  50 years ago    had some of her stomach removed      SOCIAL HISTORY:  Social History   Social History  . Marital status: Widowed    Spouse name: N/A  . Number of children: 2  . Years of  education: N/A   Occupational History  . retired from Teacher, adult education Retired   Social History Main Topics  . Smoking status: Former Smoker    Types: Cigarettes    Quit date: 03/10/2016  . Smokeless tobacco: Never Used     Comment: quit a few weeks ago   . Alcohol use No  . Drug use: No  . Sexual activity: No   Other Topics Concern  . Not on file   Social History Narrative  . No narrative on file    FAMILY HISTORY:  Family History  Problem Relation Age of Onset  . Hypertension Mother   . Kidney failure Brother     X1 ON DIALYSIS  . Diabetes Sister   . Cancer Sister     X2  . Hypertension Father   . Liver disease Neg Hx   . Colon cancer Neg Hx     CURRENT MEDICATIONS:  Outpatient Encounter Prescriptions as of 09/16/2016  Medication Sig Note  . acetaminophen (TYLENOL) 500 MG tablet Take 500 mg by mouth every 6 (six) hours as needed.   Marland Kitchen amLODipine (NORVASC) 10 MG tablet Take 1 tablet (10 mg total) by mouth daily.   . Calcium-Magnesium-Vitamin D (CALCIUM 1200+D3 PO) Take 2 tablets by mouth daily.   . clonazePAM (KLONOPIN) 0.5 MG tablet Take 1 tablet (0.5 mg total) by mouth 2 (two) times daily as needed. for anxiety   . cycloSPORINE (RESTASIS) 0.05 % ophthalmic emulsion Place 1 drop into both eyes 2 (two) times daily.   . ferrous sulfate 325 (65 FE) MG tablet Take 325 mg by mouth daily with breakfast.   . hydrochlorothiazide (MICROZIDE) 12.5 MG capsule Take 6.25 mg by mouth 2 (two) times daily.   Marland Kitchen lidocaine-prilocaine (EMLA) cream APPLY A QUARTER SIZE AMOUNT TO PORT SITE 1 HOURS PRIOR TO CHEMO DO NOT RUB IN COVER WITH PLASTIC WRAP   . metoprolol tartrate (LOPRESSOR) 25 MG tablet Take 0.5 tablets (12.5 mg total) by mouth 2 (two) times daily.   . montelukast (SINGULAIR) 10 MG tablet Take 1 tablet (10 mg total) by mouth at bedtime.   . pantoprazole (PROTONIX) 20 MG tablet TAKE 1 TABLET(20 MG) BY MOUTH DAILY   . Potassium 99 MG TABS Take 1 tablet by mouth daily.   . potassium  chloride SA (K-DUR,KLOR-CON) 20 MEQ tablet Take 1 tablet (20 mEq total) by mouth 2 (two)  times daily.   . predniSONE (DELTASONE) 20 MG tablet Take 1 tablet (20 mg total) by mouth daily with breakfast.   . Probiotic Product (HEALTHY COLON PO) Take 1 tablet by mouth every morning.  05/16/2015: Takes on treatment days  . traMADol (ULTRAM) 50 MG tablet One tablet twice daily as needed for pain    Facility-Administered Encounter Medications as of 09/16/2016  Medication  . dexamethasone (DECADRON) 10 mg in sodium chloride 0.9 % 50 mL IVPB    ALLERGIES:  Allergies  Allergen Reactions  . Iohexol Hives and Shortness Of Breath    PATIENT HAD TO BE TAKEN TO ED, C/O WHELPS, HIVES, DIFFICULTY BREATHING  . Aciphex [Rabeprazole Sodium] Other (See Comments)    unknown  . Amlodipine Besylate-Valsartan     Rash to high-dose (5/320)  . Esomeprazole Magnesium   . Omeprazole Other (See Comments)    Patient states "medication didn't work"  . Penicillins Other (See Comments)    unknown  . Ciprofloxacin Rash     PHYSICAL EXAM:  ECOG Performance status: 1-2 - Symptomatic, but largely independent; may require occasional assistance.   Vitals:   09/16/16 0932  BP: 136/63  Pulse: (!) 56  Resp: 16  Temp: 98.1 F (36.7 C)   Filed Weights   09/16/16 0932  Weight: 134 lb 8 oz (61 kg)    Physical Exam  Constitutional: She is oriented to person, place, and time and well-developed, well-nourished, and in no distress.  HENT:  Head: Normocephalic.  Mouth/Throat: Oropharynx is clear and moist. No oropharyngeal exudate.  Eyes: Conjunctivae are normal. Pupils are equal, round, and reactive to light. No scleral icterus.  Neck: Normal range of motion. Neck supple.  Cardiovascular: Regular rhythm.   Bradycardic  Pulmonary/Chest: Effort normal and breath sounds normal. No respiratory distress. She has no wheezes.  Abdominal: Soft. Bowel sounds are normal. There is no tenderness. There is no rebound and no  guarding.  Musculoskeletal: Normal range of motion. She exhibits no edema.  Mild RLE weakness   Lymphadenopathy:    She has no cervical adenopathy.  Neurological: She is alert and oriented to person, place, and time.  Skin: Skin is warm and dry. No rash noted.  Psychiatric: Mood, memory, affect and judgment normal.  Nursing note and vitals reviewed.    LABORATORY DATA:  I have reviewed the labs as listed.  CBC    Component Value Date/Time   WBC 6.8 08/28/2016 1012   RBC 4.34 08/28/2016 1012   HGB 12.4 08/28/2016 1012   HCT 36.5 08/28/2016 1012   PLT 167 08/28/2016 1012   MCV 84.1 08/28/2016 1012   MCH 28.6 08/28/2016 1012   MCHC 34.0 08/28/2016 1012   RDW 14.6 08/28/2016 1012   LYMPHSABS 1.6 08/28/2016 1012   MONOABS 0.4 08/28/2016 1012   EOSABS 0.1 08/28/2016 1012   BASOSABS 0.0 08/28/2016 1012   CMP Latest Ref Rng & Units 08/28/2016 06/23/2016 05/23/2016  Glucose 65 - 99 mg/dL 122(H) 109(H) -  BUN 6 - 20 mg/dL 17 20 -  Creatinine 0.44 - 1.00 mg/dL 1.09(H) 1.00 1.10(H)  Sodium 135 - 145 mmol/L 140 138 -  Potassium 3.5 - 5.1 mmol/L 3.7 3.3(L) -  Chloride 101 - 111 mmol/L 102 103 -  CO2 22 - 32 mmol/L 30 25 -  Calcium 8.9 - 10.3 mg/dL 9.4 9.0 -  Total Protein 6.5 - 8.1 g/dL 7.4 7.0 -  Total Bilirubin 0.3 - 1.2 mg/dL 0.9 0.9 -  Alkaline Phos 38 -  126 U/L 142(H) 143(H) -  AST 15 - 41 U/L 23 30 -  ALT 14 - 54 U/L 14 23 -     PENDING LABS:    DIAGNOSTIC IMAGING:  MRI abd: 09/11/16 *Images and radiologic reports reviewed independently and I agree with below findings.         PATHOLOGY:  Liver biopsy: 03/09/14         ASSESSMENT & PLAN:   Stage IV pancreatic cancer with liver mets:  -Metastatic disease noted 03/10/14 with biopsy confirmed liver mets. Received chemotherapy with Gemzar/Abraxane on days 1 & 8 every 28 day cycle; CA 19-9 normalized and subsequent imaging revealed response and later no residual detectable liver mets in 07/24/15. Chemotherapy  drug holiday started in 02/2016, after 2 years of chemotherapy. Recent MRI 09/11/16 indicates progression of pancreatic tail lesion & right liver with concern for recurrent metastatic disease. CA 19-9 rising as well, with most recent value 463 on 08/28/16.  -Reviewed and discussed in detail the findings of recent MRI imaging with patient. She understands that the rising tumor marker and recent imaging suggests recurrence/progression of disease. We discussed different options including resuming chemotherapy vs stopping active treatment and focusing on symptom management alone.  We discussed the pros/cons of each option, including possible side effects of chemotherapy.  She elects to resume chemotherapy, which is certainly reasonable given her previous excellent response and tolerance to chemo.  -Discussed with Dr. Talbert Cage. Will plan to resume chemotherapy with Gemzar/Abraxane, days 1 & 8; Day 15 was previously omitted during initial chemotherapy course due to cytopenias. Will proceed with omitting day 15 for resuming chemotherapy as well;  treatment plan created accordingly.  Certainly, Gemzar alone could be considered in the future if she has intolerance to doublet therapy.  -She will re-start chemotherapy next week. I will plan to see her for follow-up on day 8 of cycle #1 chemotherapy.     Urinary urgency and intermittent bladder pain:  -Will collect UA with urine culture today; we will make her aware of the results when they are made available.    Chronic low back pain with right leg numbness/weakness:  -Previous MRI spine in 03/2015 demonstrated severe disc narrowing and nerve root impingement.  There was no evidence of malignancy at that time.  -Since it has been several years since her spine was imaged and her symptoms persist, I will obtain repeat MRI spine imaging to ensure stability of disc disease and rule out metastatic disease. Ms. Leamer does not think she has ever seen a orthopedic specialist for  her back pain.  May need referral to neurology/spine/orthopedic specialists vs radiation oncology based on MRI spine results.  Orders for scan placed today.    Goals of care:  -She understands that her pancreatic cancer is not curable, but is treatable. We also discussed that there may come a time when continuing treatment would no longer be in her best interest, whereby we will recommend initiating comfort/symptom management measures rather than treating the cancer.  She understands this and does not appear to be unreasonable in her goals.   -Re-confirmed patient's wishes for natural death in the event of a medical emergency; DNR remains her expressed wishes. Her sister, who is present today, is also aware of Ms. Winslow' wishes. Outpatient DNR orders signed today.       Dispo:  -UA/urine culture today.  -MRI spine as soon as able.  -Return next week to resume chemotherapy.  -Follow-up visit on day 8, cycle #  1 chemotherapy to assess tolerance/adverse effects.    All questions were answered to patient's stated satisfaction. Encouraged patient to call with any new concerns or questions before her next visit to the cancer center and we can certain see her sooner, if needed.    Plan of care discussed with Dr. Talbert Cage, who agrees with the above aforementioned.    Orders placed this encounter:  Orders Placed This Encounter  Procedures  . Urine culture  . MR Lumbar Spine W Wo Contrast  . Urinalysis, Routine w reflex microscopic  . DNR (Do Not Resuscitate)      Mike Craze, NP [place] [phone]

## 2016-09-16 NOTE — Patient Instructions (Signed)
[place] at [facility] Discharge Instructions  RECOMMENDATIONS MADE BY THE CONSULTANT AND ANY TEST RESULTS WILL BE SENT TO YOUR REFERRING PHYSICIAN.  You were seen today by Mike Craze NP. Start Chemo on next Wednesday. MRI has been ordered. Follow up in 2 weeks.   Thank you for choosing [facility] at [facility] to provide your oncology and hematology care.  To afford each patient quality time with our provider, please arrive at least 15 minutes before your scheduled appointment time.    If you have a lab appointment with the [facility] please come in thru the  Main Entrance and check in at the main information desk  You need to re-schedule your appointment should you arrive 10 or more minutes late.  We strive to give you quality time with our providers, and arriving late affects you and other patients whose appointments are after yours.  Also, if you no show three or more times for appointments you may be dismissed from the clinic at the providers discretion.     Again, thank you for choosing [facility].  Our hope is that these requests will decrease the amount of time that you wait before being seen by our physicians.       _____________________________________________________________  Should you have questions after your visit to [facility], please contact our office at [phone] between the hours of 8:30 a.m. and 4:30 p.m.  Voicemails left after 4:30 p.m. will not be returned until the following business day.  For prescription refill requests, have your pharmacy contact our office.       Resources For Cancer Patients and their Caregivers ? American Cancer Society: Can assist with transportation, wigs, general needs, runs Look Good Feel Better.        902-840-4373 ? Cancer Care: Provides financial assistance, online support groups, medication/co-pay assistance.  1-800-813-HOPE  (218)270-1468) ? [place] Assists [place] cancer patients and their families through emotional , educational and financial support.  (803)158-7091 ? [place] DSS Where to apply for food stamps, Medicaid and utility assistance. (330)785-3541 ? RCATS: Transportation to medical appointments. 320-245-7244 ? Social Security Administration: May apply for disability if have a Stage IV cancer. 2190181016 6786367299 ? [employer], Disability and Transit Services: Assists with nutrition, care and transit needs. [place] Support Programs: @10RELATIVEDAYS @ > Cancer Support Group  2nd Tuesday of the month 1pm-2pm, Journey Room  > Creative Journey  3rd Tuesday of the month 1130am-1pm, Journey Room  > Look Good Feel Better  1st Wednesday of the month 10am-12 noon, Journey Room (Call [place] to register [phone])

## 2016-09-18 ENCOUNTER — Other Ambulatory Visit (HOSPITAL_COMMUNITY): Payer: Self-pay | Admitting: Pharmacist

## 2016-09-18 LAB — URINE CULTURE

## 2016-09-18 MED ORDER — PROCHLORPERAZINE MALEATE 10 MG PO TABS: 10.0000 mg | ORAL_TABLET | Freq: Four times a day (QID) | ORAL | 1 refills | Status: DC | PRN

## 2016-09-18 MED ORDER — ONDANSETRON HCL 8 MG PO TABS: 8.0000 mg | ORAL_TABLET | Freq: Two times a day (BID) | ORAL | 1 refills | Status: DC | PRN

## 2016-09-18 MED ORDER — LIDOCAINE-PRILOCAINE 2.5-2.5 % EX CREA: TOPICAL_CREAM | CUTANEOUS | 3 refills | Status: DC

## 2016-09-19 ENCOUNTER — Ambulatory Visit (HOSPITAL_COMMUNITY): Admission: RE | Admit: 2016-09-19 | Payer: Medicare HMO | Source: Ambulatory Visit

## 2016-09-24 ENCOUNTER — Ambulatory Visit (INDEPENDENT_AMBULATORY_CARE_PROVIDER_SITE_OTHER): Payer: Medicare HMO

## 2016-09-24 VITALS — BP 134/72 | HR 52 | Temp 98.8°F | Ht 64.0 in | Wt 131.0 lb

## 2016-09-24 DIAGNOSIS — Z0000 Encounter for general adult medical examination without abnormal findings: Secondary | ICD-10-CM

## 2016-09-24 NOTE — Patient Instructions (Signed)
[name] , Thank you for taking time to come for your Medicare Wellness Visit. I appreciate your ongoing commitment to your health goals. Please review the following plan we discussed and let me know if I can assist you in the future.   Screening recommendations/referrals: Colonoscopy: No longer required Mammogram: Up to date, next due 07/2017 Bone Density: Up to date Recommended yearly ophthalmology/optometry visit for glaucoma screening and checkup Recommended yearly dental visit for hygiene and checkup  Vaccinations: Influenza vaccine: Up to date, next due 12/2016 Pneumococcal vaccine: Up to date Tdap vaccine: Up to date, next due 12/2019 Shingles vaccine: Due, declines   Advanced directives: Advance directive discussed with you today. I have provided a copy for you to complete at home and have notarized. Once this is complete please bring a copy in to our office so we can scan it into your chart.  Next appointment: Follow up with Dr. Moshe Cipro toward the end of August or early September. Follow up in 1 year for your annual wellness visit.   Preventive Care 44 Years and Older, Female Preventive care refers to lifestyle choices and visits with your health care provider that can promote health and wellness. What does preventive care include?  A yearly physical exam. This is also called an annual well check.  Dental exams once or twice a year.  Routine eye exams. Ask your health care provider how often you should have your eyes checked.  Personal lifestyle choices, including:  Daily care of your teeth and gums.  Regular physical activity.  Eating a healthy diet.  Avoiding tobacco and drug use.  Limiting alcohol use.  Practicing safe sex.  Taking low-dose aspirin every day.  Taking vitamin and mineral supplements as recommended by your health care provider. What happens during an annual well check? The services and screenings done by your health care provider during your  annual well check will depend on your age, overall health, lifestyle risk factors, and family history of disease. Counseling  Your health care provider may ask you questions about your:  Alcohol use.  Tobacco use.  Drug use.  Emotional well-being.  Home and relationship well-being.  Sexual activity.  Eating habits.  History of falls.  Memory and ability to understand (cognition).  Work and work Statistician.  Reproductive health. Screening  You may have the following tests or measurements:  Height, weight, and BMI.  Blood pressure.  Lipid and cholesterol levels. These may be checked every 5 years, or more frequently if you are over 37 years old.  Skin check.  Lung cancer screening. You may have this screening every year starting at age 9 if you have a 30-pack-year history of smoking and currently smoke or have quit within the past 15 years.  Fecal occult blood test (FOBT) of the stool. You may have this test every year starting at age 73.  Flexible sigmoidoscopy or colonoscopy. You may have a sigmoidoscopy every 5 years or a colonoscopy every 10 years starting at age 74.  Hepatitis C blood test.  Hepatitis B blood test.  Sexually transmitted disease (STD) testing.  Diabetes screening. This is done by checking your blood sugar (glucose) after you have not eaten for a while (fasting). You may have this done every 1-3 years.  Bone density scan. This is done to screen for osteoporosis. You may have this done starting at age 52.  Mammogram. This may be done every 1-2 years. Talk to your health care provider about how often you should  have regular mammograms. Talk with your health care provider about your test results, treatment options, and if necessary, the need for more tests. Vaccines  Your health care provider may recommend certain vaccines, such as:  Influenza vaccine. This is recommended every year.  Tetanus, diphtheria, and acellular pertussis (Tdap, Td)  vaccine. You may need a Td booster every 10 years.  Zoster vaccine. You may need this after age 26.  Pneumococcal 13-valent conjugate (PCV13) vaccine. One dose is recommended after age 53.  Pneumococcal polysaccharide (PPSV23) vaccine. One dose is recommended after age 72. Talk to your health care provider about which screenings and vaccines you need and how often you need them. This information is not intended to replace advice given to you by your health care provider. Make sure you discuss any questions you have with your health care provider. Document Released: 05/25/2015 Document Revised: 01/16/2016 Document Reviewed: 02/27/2015 Elsevier Interactive Patient Education  2017 [place] Prevention in the Home Falls can cause injuries. They can happen to people of all ages. There are many things you can do to make your home safe and to help prevent falls. What can I do on the outside of my home?  Regularly fix the edges of walkways and driveways and fix any cracks.  Remove anything that might make you trip as you walk through a door, such as a raised step or threshold.  Trim any bushes or trees on the path to your home.  Use bright outdoor lighting.  Clear any walking paths of anything that might make someone trip, such as rocks or tools.  Regularly check to see if handrails are loose or broken. Make sure that both sides of any steps have handrails.  Any raised decks and porches should have guardrails on the edges.  Have any leaves, snow, or ice cleared regularly.  Use sand or salt on walking paths during winter.  Clean up any spills in your garage right away. This includes oil or grease spills. What can I do in the bathroom?  Use night lights.  Install grab bars by the toilet and in the tub and shower. Do not use towel bars as grab bars.  Use non-skid mats or decals in the tub or shower.  If you need to sit down in the shower, use a plastic, non-slip  stool.  Keep the floor dry. Clean up any water that spills on the floor as soon as it happens.  Remove soap buildup in the tub or shower regularly.  Attach bath mats securely with double-sided non-slip rug tape.  Do not have throw rugs and other things on the floor that can make you trip. What can I do in the bedroom?  Use night lights.  Make sure that you have a light by your bed that is easy to reach.  Do not use any sheets or blankets that are too big for your bed. They should not hang down onto the floor.  Have a firm chair that has side arms. You can use this for support while you get dressed.  Do not have throw rugs and other things on the floor that can make you trip. What can I do in the kitchen?  Clean up any spills right away.  Avoid walking on wet floors.  Keep items that you use a lot in easy-to-reach places.  If you need to reach something above you, use a strong step stool that has a grab bar.  Keep electrical cords out of  the way.  Do not use floor polish or wax that makes floors slippery. If you must use wax, use non-skid floor wax.  Do not have throw rugs and other things on the floor that can make you trip. What can I do with my stairs?  Do not leave any items on the stairs.  Make sure that there are handrails on both sides of the stairs and use them. Fix handrails that are broken or loose. Make sure that handrails are as long as the stairways.  Check any carpeting to make sure that it is firmly attached to the stairs. Fix any carpet that is loose or worn.  Avoid having throw rugs at the top or bottom of the stairs. If you do have throw rugs, attach them to the floor with carpet tape.  Make sure that you have a light switch at the top of the stairs and the bottom of the stairs. If you do not have them, ask someone to add them for you. What else can I do to help prevent falls?  Wear shoes that:  Do not have high heels.  Have rubber bottoms.  Are  comfortable and fit you well.  Are closed at the toe. Do not wear sandals.  If you use a stepladder:  Make sure that it is fully opened. Do not climb a closed stepladder.  Make sure that both sides of the stepladder are locked into place.  Ask someone to hold it for you, if possible.  Clearly mark and make sure that you can see:  Any grab bars or handrails.  First and last steps.  Where the edge of each step is.  Use tools that help you move around (mobility aids) if they are needed. These include:  Canes.  Walkers.  Scooters.  Crutches.  Turn on the lights when you go into a dark area. Replace any light bulbs as soon as they burn out.  Set up your furniture so you have a clear path. Avoid moving your furniture around.  If any of your floors are uneven, fix them.  If there are any pets around you, be aware of where they are.  Review your medicines with your doctor. Some medicines can make you feel dizzy. This can increase your chance of falling. Ask your doctor what other things that you can do to help prevent falls. This information is not intended to replace advice given to you by your health care provider. Make sure you discuss any questions you have with your health care provider. Document Released: 02/22/2009 Document Revised: 10/04/2015 Document Reviewed: 06/02/2014 Elsevier Interactive Patient Education  2017 [employer].

## 2016-09-24 NOTE — Progress Notes (Signed)
Subjective:   [name] is a 83 y.o. female who presents for Medicare Annual (Subsequent) preventive examination.  Review of Systems:  Cardiac Risk Factors include: advanced age (>78men, >86 women);hypertension;sedentary lifestyle     Objective:     Vitals: BP 134/72   Pulse (!) 52   Temp 98.8 F (37.1 C) (Oral)   Ht 5\' 4"  (1.626 m)   Wt 131 lb 0.6 oz (59.4 kg)   SpO2 95%   BMI 22.49 kg/m   Body mass index is 22.49 kg/m.   Tobacco History  Smoking Status  . Former Smoker  . Packs/day: 0.50  . Years: 60.00  . Types: Cigarettes  . Quit date: 09/09/2016  Smokeless Tobacco  . Never Used     Counseling given: Not Answered   Past Medical History:  Diagnosis Date  . Anemia due to antineoplastic chemotherapy 09/12/2015   Started Aranesp 500 mcg on 09/12/2015  . Anxiety   . Chronic diarrhea   . Depression   . DNR (do not resuscitate) 08/16/2015  . Erosive esophagitis   . GERD (gastroesophageal reflux disease)   . Hx of adenomatous colonic polyps    tubular adenomas, last found in 2008  . Hyperplastic colon polyp 03/19/10   tcs by Dr. Gala Romney  . Hypertension 20 years   . Kidney stone    hx/ crushed   . Opioid contract exists 04/18/2015   With Dr. Moshe Cipro  . Pancreatic cancer ([place]) 02/2014  . Pancreatic cancer metastasized to liver ([place]) 03/10/2014  . Schatzki's ring 08/26/10   Last dilated on EGD by Dr. Trevor Iha [facility], linear gastric erosions, BI hemigastrectomy   Past Surgical History:  Procedure Laterality Date  . BREAST LUMPECTOMY Left   . bunion removal     from both feet   . CHOLECYSTECTOMY  1965   . COLONOSCOPY  03/19/2010   DR Gala Romney,, normal TI, pancolonic diverticula, random colon bx neg., hyperplastic polyps removed  . ESOPHAGOGASTRODUODENOSCOPY  11/22/2003   DR Gala Romney, erosive RE, Billroth I  . ESOPHAGOGASTRODUODENOSCOPY  08/26/10   Dr. Gala Romney- moderate severe ERE, Scahtzki ring s/p dilation, Billroth I, linear gastric erosions, bx-gastric xanthelasma    . LEFT SHOULDER SURGERY  2009   DR HARRISON  . ORIF ANKLE FRACTURE Right 05/22/2013   Procedure: OPEN REDUCTION INTERNAL FIXATION (ORIF) RIGHT ANKLE FRACTURE;  Surgeon: Sanjuana Kava, MD;  Location: [facility];  Service: Orthopedics;  Laterality: Right;  . stomach ulcer  50 years ago    had some of her stomach removed    Family History  Problem Relation Age of Onset  . Hypertension Mother   . Kidney failure Brother        on dialysis  . Diabetes Sister   . Diabetes Sister   . Hypertension Father   . Kidney failure Sister   . Kidney Stones Brother   . Breast cancer Son   . Gout Son   . Liver disease Neg Hx   . Colon cancer Neg Hx    History  Sexual Activity  . Sexual activity: No    Outpatient Encounter Prescriptions as of 09/24/2016  Medication Sig  . acetaminophen (TYLENOL) 500 MG tablet Take 500 mg by mouth every 6 (six) hours as needed.  Marland Kitchen amLODipine (NORVASC) 10 MG tablet Take 1 tablet (10 mg total) by mouth daily.  . Calcium-Phosphorus-Vitamin D [phone] MG-MG-UNIT CHEW Chew 2 tablets by mouth daily.  . clonazePAM (KLONOPIN) 0.5 MG tablet Take 1 tablet (0.5 mg total) by mouth  2 (two) times daily as needed. for anxiety  . cycloSPORINE (RESTASIS) 0.05 % ophthalmic emulsion Place 1 drop into both eyes 2 (two) times daily.  . ferrous sulfate 325 (65 FE) MG tablet Take 325 mg by mouth daily with breakfast.  . hydrochlorothiazide (MICROZIDE) 12.5 MG capsule Take 6.25 mg by mouth 2 (two) times daily.  Marland Kitchen lidocaine-prilocaine (EMLA) cream APPLY A QUARTER SIZE AMOUNT TO PORT SITE 1 HOURS PRIOR TO CHEMO DO NOT RUB IN COVER WITH PLASTIC WRAP  . metoprolol tartrate (LOPRESSOR) 25 MG tablet Take 0.5 tablets (12.5 mg total) by mouth 2 (two) times daily.  . Multiple Vitamins-Minerals (EQ ONE DAILY WOMENS 50+) TABS Take 1 tablet by mouth daily.  . ondansetron (ZOFRAN) 8 MG tablet Take 1 tablet (8 mg total) by mouth 2 (two) times daily as needed (Nausea or vomiting).  . pantoprazole  (PROTONIX) 20 MG tablet TAKE 1 TABLET(20 MG) BY MOUTH DAILY  . Potassium 99 MG TABS Take 1 tablet by mouth daily.  . prochlorperazine (COMPAZINE) 10 MG tablet Take 1 tablet (10 mg total) by mouth every 6 (six) hours as needed (Nausea or vomiting).  . traMADol (ULTRAM) 50 MG tablet One tablet twice daily as needed for pain  . Turmeric Curcumin 500 MG CAPS Take 1 capsule by mouth daily.  . potassium chloride SA (K-DUR,KLOR-CON) 20 MEQ tablet Take 1 tablet (20 mEq total) by mouth 2 (two) times daily. (Patient not taking: Reported on 09/24/2016)  . predniSONE (DELTASONE) 20 MG tablet Take 1 tablet (20 mg total) by mouth daily with breakfast. (Patient not taking: Reported on 09/24/2016)  . [DISCONTINUED] Calcium-Magnesium-Vitamin D (CALCIUM 1200+D3 PO) Take 2 tablets by mouth daily.  . [DISCONTINUED] montelukast (SINGULAIR) 10 MG tablet Take 1 tablet (10 mg total) by mouth at bedtime. (Patient not taking: Reported on 09/24/2016)  . [DISCONTINUED] Probiotic Product (HEALTHY COLON PO) Take 1 tablet by mouth every morning.    Facility-Administered Encounter Medications as of 09/24/2016  Medication  . dexamethasone (DECADRON) 10 mg in sodium chloride 0.9 % 50 mL IVPB    Activities of Daily Living In your present state of health, do you have any difficulty performing the following activities: 09/24/2016 08/28/2016  Hearing? N N  Vision? Y N  Difficulty concentrating or making decisions? Y N  Walking or climbing stairs? Y N  Dressing or bathing? N N  Doing errands, shopping? N N  Preparing Food and eating ? Y -  Using the Toilet? N -  In the past six months, have you accidently leaked urine? N -  Do you have problems with loss of bowel control? N -  Managing your Medications? Y -  Managing your Finances? N -  Housekeeping or managing your Housekeeping? N -  Some recent data might be hidden    Patient Care Team: Fayrene Helper, MD as PCP - General [place], Kelby Fam, MD as Consulting Physician  (Hematology and Oncology) Madelin Headings, DO (Optometry) Sheldon Silvan, Scharlene Corn as Physician Assistant (Oncology) Holley Bouche, NP as Nurse Practitioner (Nurse Practitioner)    Assessment:    Exercise Activities and Dietary recommendations Current Exercise Habits: The patient does not participate in regular exercise at present, Exercise limited by: orthopedic condition(s)  Goals    . Increase water intake      Fall Risk Fall Risk  09/24/2016 08/28/2016 05/01/2016 09/20/2015 09/20/2015  Falls in the past year? Yes Yes No - No  Number falls in past yr: 1 2 or more - - -  Injury with Fall? Yes Yes - - -  Risk for fall due to : Impaired balance/gait - - Impaired balance/gait Impaired balance/gait;Impaired mobility  Follow up Falls evaluation completed;Education provided;Falls prevention discussed - - - -   Depression Screen PHQ 2/9 Scores 09/24/2016 08/28/2016 05/01/2016 09/20/2015  PHQ - 2 Score 0 0 0 0  PHQ- 9 Score - - - -     Cognitive Function: Normal MMSE - Mini Mental State Exam 05/01/2016  Orientation to time 5  Orientation to Place 5  Registration 3  Attention/ Calculation 5  Recall 0  Language- name 2 objects 2  Language- repeat 1  Language- follow 3 step command 3  Language- read & follow direction 1  Write a sentence 1  Copy design 1  Total score 27     6CIT Screen 09/24/2016  What Year? 0 points  What month? 0 points  What time? 0 points  Count back from 20 0 points  Months in reverse 0 points  Repeat phrase 0 points  Total Score 0    Immunization History  Administered Date(s) Administered  . Influenza Nasal 02/08/2014  . Influenza Split 03/13/2011, 02/03/2012  . Influenza Whole 02/13/2010  . Influenza,inj,Quad PF,36+ Mos 02/04/2013, 02/21/2015, 01/30/2016  . Pneumococcal Conjugate-13 03/10/2016  . Pneumococcal Polysaccharide-23 01/07/2010, 05/23/2013  . Td 01/07/2010   Screening Tests Health Maintenance  Topic Date Due  . INFLUENZA VACCINE   12/10/2016  . TETANUS/TDAP  01/08/2020  . DEXA SCAN  Completed  . PNA vac Low Risk Adult  Completed      Plan:   I have personally reviewed and noted the following in the patient's chart:   . Medical and social history . Use of alcohol, tobacco or illicit drugs  . Current medications and supplements . Functional ability and status . Nutritional status . Physical activity . Advanced directives . List of other physicians . Hospitalizations, surgeries, and ER visits in previous 12 months . Vitals . Screenings to include cognitive, depression, and falls . Referrals and appointments  In addition, I have reviewed and discussed with patient certain preventive protocols, quality metrics, and best practice recommendations. A written personalized care plan for preventive services as well as general preventive health recommendations were provided to patient.     Tequilla Cousineau, LPN  01/12/4096

## 2016-09-25 ENCOUNTER — Encounter (HOSPITAL_COMMUNITY): Payer: Self-pay

## 2016-09-25 ENCOUNTER — Encounter (HOSPITAL_COMMUNITY): Payer: Medicare HMO | Attending: Oncology

## 2016-09-25 VITALS — BP 161/51 | HR 62 | Temp 97.5°F | Resp 18 | Wt 133.0 lb

## 2016-09-25 DIAGNOSIS — C787 Secondary malignant neoplasm of liver and intrahepatic bile duct: Secondary | ICD-10-CM | POA: Insufficient documentation

## 2016-09-25 DIAGNOSIS — Z5111 Encounter for antineoplastic chemotherapy: Secondary | ICD-10-CM | POA: Diagnosis not present

## 2016-09-25 DIAGNOSIS — C259 Malignant neoplasm of pancreas, unspecified: Secondary | ICD-10-CM | POA: Diagnosis not present

## 2016-09-25 LAB — CBC WITH DIFFERENTIAL/PLATELET
BASOS ABS: 0 10*3/uL (ref 0.0–0.1)
BASOS PCT: 0 %
EOS ABS: 0.1 10*3/uL (ref 0.0–0.7)
Eosinophils Relative: 2 %
HEMATOCRIT: 35.5 % — AB (ref 36.0–46.0)
HEMOGLOBIN: 11.8 g/dL — AB (ref 12.0–15.0)
LYMPHS ABS: 1.4 10*3/uL (ref 0.7–4.0)
Lymphocytes Relative: 27 %
MCH: 28.4 pg (ref 26.0–34.0)
MCHC: 33.2 g/dL (ref 30.0–36.0)
MCV: 85.3 fL (ref 78.0–100.0)
Monocytes Absolute: 0.3 10*3/uL (ref 0.1–1.0)
Monocytes Relative: 6 %
NEUTROS ABS: 3.3 10*3/uL (ref 1.7–7.7)
Neutrophils Relative %: 65 %
Platelets: 159 10*3/uL (ref 150–400)
RBC: 4.16 MIL/uL (ref 3.87–5.11)
RDW: 15.2 % (ref 11.5–15.5)
WBC: 5.1 10*3/uL (ref 4.0–10.5)

## 2016-09-25 LAB — COMPREHENSIVE METABOLIC PANEL
ALBUMIN: 3.4 g/dL — AB (ref 3.5–5.0)
ALK PHOS: 159 U/L — AB (ref 38–126)
ALT: 15 U/L (ref 14–54)
AST: 26 U/L (ref 15–41)
Anion gap: 7 (ref 5–15)
BILIRUBIN TOTAL: 0.5 mg/dL (ref 0.3–1.2)
BUN: 22 mg/dL — ABNORMAL HIGH (ref 6–20)
CO2: 29 mmol/L (ref 22–32)
Calcium: 8.8 mg/dL — ABNORMAL LOW (ref 8.9–10.3)
Chloride: 102 mmol/L (ref 101–111)
Creatinine, Ser: 1.14 mg/dL — ABNORMAL HIGH (ref 0.44–1.00)
GFR calc Af Amer: 49 mL/min — ABNORMAL LOW (ref >60)
GFR calc non Af Amer: 43 mL/min — ABNORMAL LOW (ref >60)
GLUCOSE: 103 mg/dL — AB (ref 65–99)
Potassium: 4.3 mmol/L (ref 3.5–5.1)
Sodium: 138 mmol/L (ref 135–145)
Total Protein: 6.7 g/dL (ref 6.5–8.1)

## 2016-09-25 MED ORDER — SODIUM CHLORIDE 0.9 % IV SOLN
960.0000 mg/m2 | Freq: Once | INTRAVENOUS | Status: AC
  Administered 2016-09-25: 1596 mg via INTRAVENOUS
  Filled 2016-09-25: qty 26.22

## 2016-09-25 MED ORDER — SODIUM CHLORIDE 0.9 % IV SOLN
Freq: Once | INTRAVENOUS | Status: AC
  Administered 2016-09-25: 11:00:00 via INTRAVENOUS

## 2016-09-25 MED ORDER — PROCHLORPERAZINE MALEATE 10 MG PO TABS
10.0000 mg | ORAL_TABLET | Freq: Once | ORAL | Status: AC
  Administered 2016-09-25: 10 mg via ORAL
  Filled 2016-09-25: qty 1

## 2016-09-25 MED ORDER — HEPARIN SOD (PORK) LOCK FLUSH 100 UNIT/ML IV SOLN
500.0000 [IU] | Freq: Once | INTRAVENOUS | Status: AC | PRN
  Administered 2016-09-25: 500 [IU]

## 2016-09-25 MED ORDER — SODIUM CHLORIDE 0.9% FLUSH
10.0000 mL | INTRAVENOUS | Status: DC | PRN
  Administered 2016-09-25: 10 mL
  Filled 2016-09-25: qty 10

## 2016-09-25 MED ORDER — PACLITAXEL PROTEIN-BOUND CHEMO INJECTION 100 MG
125.0000 mg/m2 | Freq: Once | INTRAVENOUS | Status: AC
  Administered 2016-09-25: 200 mg via INTRAVENOUS
  Filled 2016-09-25: qty 40

## 2016-09-25 NOTE — Patient Instructions (Signed)
[facility] Discharge Instructions for Patients Receiving Chemotherapy   Beginning January 23rd 2017 lab work for the Cancer Center will be done in the  Main lab at [place] on 1st floor. If you have a lab appointment with the Cancer Center please come in thru the  Main Entrance and check in at the main information desk   Today you received the following chemotherapy agents Abraxane and Gemzar. Follow-up as scheduled. Call clinic for any questions or concerns  To help prevent nausea and vomiting after your treatment, we encourage you to take your nausea medication   If you develop nausea and vomiting, or diarrhea that is not controlled by your medication, call the clinic.  The clinic phone number is [phone]. Office hours are Monday-Friday 8:30am-5:00pm.  BELOW ARE SYMPTOMS THAT SHOULD BE REPORTED IMMEDIATELY:  *FEVER GREATER THAN 101.0 F  *CHILLS WITH OR WITHOUT FEVER  NAUSEA AND VOMITING THAT IS NOT CONTROLLED WITH YOUR NAUSEA MEDICATION  *UNUSUAL SHORTNESS OF BREATH  *UNUSUAL BRUISING OR BLEEDING  TENDERNESS IN MOUTH AND THROAT WITH OR WITHOUT PRESENCE OF ULCERS  *URINARY PROBLEMS  *BOWEL PROBLEMS  UNUSUAL RASH Items with * indicate a potential emergency and should be followed up as soon as possible. If you have an emergency after office hours please contact your primary care physician or go to the nearest emergency department.  Please call the clinic during office hours if you have any questions or concerns.   You may also contact the Patient Navigator at [phone] should you have any questions or need assistance in obtaining follow up care.      Resources For Cancer Patients and their Caregivers ? American Cancer Society: Can assist with transportation, wigs, general needs, runs Look Good Feel Better.        1-888-227-6333 ? Cancer Care: Provides financial assistance, online support groups, medication/co-pay assistance.   1-800-813-HOPE (4673) ? [facility] Assists [place] cancer patients and their families through emotional , educational and financial support.  336-427-4357 ? [place] DSS Where to apply for food stamps, Medicaid and utility assistance. 336-342-1394 ? RCATS: Transportation to medical appointments. 336-347-2287 ? Social Security Administration: May apply for disability if have a Stage IV cancer. 336-342-7796 1-800-772-1213 ? [place] Aging, Disability and Transit Services: Assists with nutrition, care and transit needs. 336-349-2343         

## 2016-09-25 NOTE — Progress Notes (Signed)
[name] tolerated chemo tx well without complaints or incident. Labs reviewed with Dr. Talbert Cage prior to administering chemotherapy. VSS upon discharge. Pt discharged self ambulatory in satisfactory condition accompanied by family members

## 2016-09-26 LAB — CANCER ANTIGEN 19-9: CA 19-9: 771 U/mL — ABNORMAL HIGH (ref 0–35)

## 2016-09-30 ENCOUNTER — Telehealth (HOSPITAL_COMMUNITY): Payer: Self-pay | Admitting: Adult Health

## 2016-09-30 NOTE — Telephone Encounter (Signed)
We received a call from [name]' son re: his mother's condition and treatment plan. [name] was also present in the room while our discussion took place via phone.    I reiterated that her pancreatic cancer and recurred and is present in both the pancreas and liver. Also her tumor marker (CA 19-9) has been increasing, which is indicative of recurrent disease as well.  Since she responded well to chemotherapy in the past, we have resume chemo.  Reviewed treatment regimen and scheduling with patient and son.    Reiterated again that we only have evidence of cancer in her pancreas and liver at this time.  Reinforced [name]' previous stated wishes to treat the cancer until which time it would not be in her best interest.  They understand that her cancer is not curable, but is treatable.    Reinforced that we will obtain re-imaging studies after a few cycles of chemotherapy to determine if her cancer is responding or not.  We will continue to offer guidance about the most appropriate treatment options going forward based on her scans, lab work, and clinical condition.   Patient's son expressed verbal understanding of this plan and agrees with it. Encouraged them to call with other questions.    Mike Craze, NP [place] [phone]

## 2016-10-02 ENCOUNTER — Encounter (HOSPITAL_COMMUNITY): Payer: Self-pay

## 2016-10-02 ENCOUNTER — Encounter (HOSPITAL_BASED_OUTPATIENT_CLINIC_OR_DEPARTMENT_OTHER): Payer: Medicare HMO | Admitting: Adult Health

## 2016-10-02 ENCOUNTER — Encounter (HOSPITAL_BASED_OUTPATIENT_CLINIC_OR_DEPARTMENT_OTHER): Payer: Medicare HMO

## 2016-10-02 VITALS — BP 171/59 | HR 58 | Temp 98.5°F | Resp 20 | Wt 132.6 lb

## 2016-10-02 DIAGNOSIS — M549 Dorsalgia, unspecified: Secondary | ICD-10-CM | POA: Diagnosis not present

## 2016-10-02 DIAGNOSIS — C259 Malignant neoplasm of pancreas, unspecified: Secondary | ICD-10-CM

## 2016-10-02 DIAGNOSIS — G8929 Other chronic pain: Secondary | ICD-10-CM | POA: Diagnosis not present

## 2016-10-02 DIAGNOSIS — R69 Illness, unspecified: Secondary | ICD-10-CM | POA: Diagnosis not present

## 2016-10-02 DIAGNOSIS — C787 Secondary malignant neoplasm of liver and intrahepatic bile duct: Secondary | ICD-10-CM

## 2016-10-02 DIAGNOSIS — Z66 Do not resuscitate: Secondary | ICD-10-CM | POA: Diagnosis not present

## 2016-10-02 DIAGNOSIS — C252 Malignant neoplasm of tail of pancreas: Secondary | ICD-10-CM

## 2016-10-02 DIAGNOSIS — E876 Hypokalemia: Secondary | ICD-10-CM | POA: Diagnosis not present

## 2016-10-02 DIAGNOSIS — R630 Anorexia: Secondary | ICD-10-CM

## 2016-10-02 DIAGNOSIS — Z5111 Encounter for antineoplastic chemotherapy: Secondary | ICD-10-CM

## 2016-10-02 DIAGNOSIS — Z9221 Personal history of antineoplastic chemotherapy: Secondary | ICD-10-CM | POA: Diagnosis not present

## 2016-10-02 DIAGNOSIS — R350 Frequency of micturition: Secondary | ICD-10-CM | POA: Diagnosis not present

## 2016-10-02 LAB — CBC WITH DIFFERENTIAL/PLATELET
Basophils Absolute: 0 10*3/uL (ref 0.0–0.1)
Basophils Relative: 0 %
EOS ABS: 0 10*3/uL (ref 0.0–0.7)
EOS PCT: 1 %
HCT: 32.4 % — ABNORMAL LOW (ref 36.0–46.0)
HEMOGLOBIN: 11 g/dL — AB (ref 12.0–15.0)
Lymphocytes Relative: 37 %
Lymphs Abs: 1.2 10*3/uL (ref 0.7–4.0)
MCH: 28.6 pg (ref 26.0–34.0)
MCHC: 34 g/dL (ref 30.0–36.0)
MCV: 84.2 fL (ref 78.0–100.0)
Monocytes Absolute: 0.2 10*3/uL (ref 0.1–1.0)
Monocytes Relative: 6 %
NEUTROS PCT: 56 %
Neutro Abs: 1.9 10*3/uL (ref 1.7–7.7)
PLATELETS: 86 10*3/uL — AB (ref 150–400)
RBC: 3.85 MIL/uL — AB (ref 3.87–5.11)
RDW: 14.4 % (ref 11.5–15.5)
WBC: 3.3 10*3/uL — AB (ref 4.0–10.5)

## 2016-10-02 LAB — COMPREHENSIVE METABOLIC PANEL WITH GFR
ALT: 16 U/L (ref 14–54)
AST: 22 U/L (ref 15–41)
Albumin: 3.1 g/dL — ABNORMAL LOW (ref 3.5–5.0)
Alkaline Phosphatase: 122 U/L (ref 38–126)
Anion gap: 8 (ref 5–15)
BUN: 14 mg/dL (ref 6–20)
CO2: 29 mmol/L (ref 22–32)
Calcium: 8.6 mg/dL — ABNORMAL LOW (ref 8.9–10.3)
Chloride: 101 mmol/L (ref 101–111)
Creatinine, Ser: 1.07 mg/dL — ABNORMAL HIGH (ref 0.44–1.00)
GFR calc Af Amer: 53 mL/min — ABNORMAL LOW
GFR calc non Af Amer: 46 mL/min — ABNORMAL LOW
Glucose, Bld: 135 mg/dL — ABNORMAL HIGH (ref 65–99)
Potassium: 3.8 mmol/L (ref 3.5–5.1)
Sodium: 138 mmol/L (ref 135–145)
Total Bilirubin: 0.5 mg/dL (ref 0.3–1.2)
Total Protein: 6.7 g/dL (ref 6.5–8.1)

## 2016-10-02 MED ORDER — SODIUM CHLORIDE 0.9 % IV SOLN
970.0000 mg/m2 | Freq: Once | INTRAVENOUS | Status: AC
  Administered 2016-10-02: 1596 mg via INTRAVENOUS
  Filled 2016-10-02: qty 15.76

## 2016-10-02 MED ORDER — HEPARIN SOD (PORK) LOCK FLUSH 100 UNIT/ML IV SOLN
INTRAVENOUS | Status: AC
  Filled 2016-10-02: qty 5

## 2016-10-02 MED ORDER — PROCHLORPERAZINE MALEATE 10 MG PO TABS
10.0000 mg | ORAL_TABLET | Freq: Once | ORAL | Status: AC
Start: 2016-10-02 — End: 2016-10-02
  Administered 2016-10-02: 10 mg via ORAL
  Filled 2016-10-02: qty 1

## 2016-10-02 MED ORDER — HEPARIN SOD (PORK) LOCK FLUSH 100 UNIT/ML IV SOLN
500.0000 [IU] | Freq: Once | INTRAVENOUS | Status: AC | PRN
  Administered 2016-10-02: 500 [IU]

## 2016-10-02 MED ORDER — PACLITAXEL PROTEIN-BOUND CHEMO INJECTION 100 MG
125.0000 mg/m2 | Freq: Once | INTRAVENOUS | Status: AC
  Administered 2016-10-02: 200 mg via INTRAVENOUS
  Filled 2016-10-02: qty 40

## 2016-10-02 MED ORDER — SODIUM CHLORIDE 0.9% FLUSH
10.0000 mL | INTRAVENOUS | Status: DC | PRN
  Administered 2016-10-02: 10 mL
  Filled 2016-10-02: qty 10

## 2016-10-02 MED ORDER — SODIUM CHLORIDE 0.9 % IV SOLN
Freq: Once | INTRAVENOUS | Status: AC
  Administered 2016-10-02: 11:00:00 via INTRAVENOUS

## 2016-10-02 NOTE — Progress Notes (Signed)
[place], [place] [address]   CLINIC:  Medical Oncology/Hematology  PCP:  Fayrene Helper, MD [address] [place] [address] [phone]   REASON FOR VISIT:  Follow-up for Stage IV pancreatic cancer with liver mets   CURRENT THERAPY: Gemzar/Abraxane, resumed 09/25/16   BRIEF ONCOLOGIC HISTORY:    Pancreatic cancer metastasized to liver ([facility])   03/10/2014 Initial Diagnosis    Pancreatic cancer metastasized to liver      03/22/2014 - 03/05/2016 Chemotherapy    Abraxane/Gemzar days 1, 8, every 28 days.  Day 15 was cancelled due to leukopenia and thrombocytopenia on day 15 cycle 1.      05/24/2014 Treatment Plan Change    Day 8 of cycle 3 is held with ANC of 1.1      06/05/2014 Imaging    CT C/A/P . Interval decrease in size of the pancreatic tail mass.Improved hepatic metastatic disease. No new lesions. No CT findings for metastatic disease involving the chest.      08/09/2014 Tumor Marker    CA 19-9= 33 (WNL)      10/26/2014 Imaging    MRI- Continued interval decrease in size of the hepatic metastatic lesions and no new lesions are identified. Continued decrease in size of the pancreatic tail lesion.      01/03/2015 Tumor Marker    Results for SHAWNEEN, DEETZ (MRN [id number]) as of 01/18/2015 12:52  01/03/2015 10:00 CA 19-9: 14       01/17/2015 Imaging    MRI- Response to therapy of hepatic metastasis.  Similar size of a pancreatic tail lesion.      03/20/2015 Imaging    MRI-L spine- Severe disc space narrowing at L2-L3, with endplate reactive changes. Large disc extrusion into the ventral epidural space,central to the RIGHT with a cephalad migrated free fragment. Additional Large disc extrusion into the retroperitone...      03/22/2015 Imaging    CT pelvis- No evidence for metastatic disease within the pelvis.      07/24/2015 Imaging    MRI abd- Continued response to therapy, with no residual detectable  liver metastases. No new sites of metastatic disease in the abdomen.       08/15/2015 Code Status     She confirms desire for DNR status.      12/21/2015 Imaging    MRI liver- Severe image degradation due to motion artifact reducing diagnostic sensitivity and specificity. Reduce conspicuity of the pancreatic tail lesion suggesting further improvement. The original liver lesions have resolved.      03/05/2016 Treatment Plan Change    Chemotherapy holiday/Break after 2 years worth of treatment      04/07/2016 Imaging    CT chest- When compared to recent chest CT, new minimally displaced anterior left sixth rib fracture. Slight increase in subcarinal adenopathy      04/28/2016 Imaging    Bone density- BMD as determined from Femur Neck Right is 0.705 g/cm2 with a T-Score of -2.4. This patient is considered osteopenic according to [facility]) criteria. Compared with the prior study on 02/23/2013, the BMD of the lumbar spine/rt. femoral neck show a statistically significant decrease.       05/23/2016 Imaging    MRI abd- 1. Exam is significantly degraded by patient respiratory motion. Consider follow-up exams with CT abdomen with without contrast per pancreatic protocol. 2. Fullness in tail of pancreas is more prominent and potentially increased in size. Recommend close attention  on follow-up. (Consider CT as above) 3. No explanation for back pain.      09/11/2016 Imaging    MRI pancreas- Interval progression of pancreatic tail lesion. New differential perfusion right liver with areas of heterogeneity. Underlying metastatic disease in this region not excluded.      09/11/2016 Progression    MRi in conjunction with rising CA 19-9 are indicative of relapse of disease.        INTERVAL HISTORY:  Ms. Lightcap 83 y.o. female returns for routine follow-up for metastatic pancreatic cancer.  She is due for day 8 of cycle #1 since resuming chemotherapy with  Gemzar/Abraxane on 09/25/16.  Since her chemotherapy last week, she tells me she has been feeling well. Denies any nausea or vomiting. Reports that she does not always have a great appetite, particularly for the first few days after chemo.  Her energy levels remain "pretty good."  Denies any fever or chills. Denies any bleeding episodes, including blood in her stools/dark tarry stools.   Endorses continued arthralgias and myalgias; reports that her right knee "tightens up" and that it often feels like she has "pins sticking in my leg." She does tell me that she feels like her myalgias and right knee joint pain are a bit better than previous. She continues to have some numbness/tingling to her feet, which are largely unchanged. Endorses occasional feet swelling as well. Since her last chemotherapy, she has struggled with constipation, but her efforts with OTC medications have been effective.   Expresses gratitude for me talking with her and her son on the phone yesterday to better understand her condition/recurrent disease.  She would like me to briefly review this again today and she would like to physically see the images of the recent MRI, which I am happy to review with her.     REVIEW OF SYSTEMS:  Review of Systems  Constitutional: Negative for chills and fever.  HENT:  Negative.  Negative for lump/mass and nosebleeds.   Eyes: Negative.   Respiratory: Negative.  Negative for cough and shortness of breath.   Cardiovascular: Positive for leg swelling. Negative for chest pain.  Gastrointestinal: Positive for constipation. Negative for abdominal pain, blood in stool, diarrhea, nausea and vomiting.  Endocrine: Negative.   Genitourinary: Negative.  Negative for dysuria and hematuria.   Musculoskeletal: Positive for arthralgias, back pain and myalgias.  Skin: Negative.  Negative for rash.  Neurological: Positive for extremity weakness and numbness. Negative for dizziness and headaches.    Hematological: Negative.  Negative for adenopathy. Does not bruise/bleed easily.  Psychiatric/Behavioral: Negative.  Negative for depression and sleep disturbance. The patient is not nervous/anxious.      PAST MEDICAL/SURGICAL HISTORY:  Past Medical History:  Diagnosis Date  . Anemia due to antineoplastic chemotherapy 09/12/2015   Started Aranesp 500 mcg on 09/12/2015  . Anxiety   . Chronic diarrhea   . Depression   . DNR (do not resuscitate) 08/16/2015  . Erosive esophagitis   . GERD (gastroesophageal reflux disease)   . Hx of adenomatous colonic polyps    tubular adenomas, last found in 2008  . Hyperplastic colon polyp 03/19/10   tcs by Dr. Gala Romney  . Hypertension 20 years   . Kidney stone    hx/ crushed   . Opioid contract exists 04/18/2015   With Dr. Moshe Cipro  . Pancreatic cancer ([place]) 02/2014  . Pancreatic cancer metastasized to liver ([place]) 03/10/2014  . Schatzki's ring 08/26/10   Last dilated on EGD by Dr. Trevor Iha  HH, linear gastric erosions, BI hemigastrectomy   Past Surgical History:  Procedure Laterality Date  . BREAST LUMPECTOMY Left   . bunion removal     from both feet   . CHOLECYSTECTOMY  1965   . COLONOSCOPY  03/19/2010   DR Gala Romney,, normal TI, pancolonic diverticula, random colon bx neg., hyperplastic polyps removed  . ESOPHAGOGASTRODUODENOSCOPY  11/22/2003   DR Gala Romney, erosive RE, Billroth I  . ESOPHAGOGASTRODUODENOSCOPY  08/26/10   Dr. Gala Romney- moderate severe ERE, Scahtzki ring s/p dilation, Billroth I, linear gastric erosions, bx-gastric xanthelasma  . LEFT SHOULDER SURGERY  2009   DR HARRISON  . ORIF ANKLE FRACTURE Right 05/22/2013   Procedure: OPEN REDUCTION INTERNAL FIXATION (ORIF) RIGHT ANKLE FRACTURE;  Surgeon: Sanjuana Kava, MD;  Location: [facility];  Service: Orthopedics;  Laterality: Right;  . stomach ulcer  50 years ago    had some of her stomach removed      SOCIAL HISTORY:  Social History   Social History  . Marital status: Widowed    Spouse  name: N/A  . Number of children: 2  . Years of education: N/A   Occupational History  . retired from Teacher, adult education Retired   Social History Main Topics  . Smoking status: Former Smoker    Packs/day: 0.50    Years: 60.00    Types: Cigarettes    Quit date: 09/09/2016  . Smokeless tobacco: Never Used  . Alcohol use No  . Drug use: No  . Sexual activity: No   Other Topics Concern  . Not on file   Social History Narrative  . No narrative on file    FAMILY HISTORY:  Family History  Problem Relation Age of Onset  . Hypertension Mother   . Kidney failure Brother        on dialysis  . Diabetes Sister   . Diabetes Sister   . Hypertension Father   . Kidney failure Sister   . Kidney Stones Brother   . Breast cancer Son   . Gout Son   . Liver disease Neg Hx   . Colon cancer Neg Hx     CURRENT MEDICATIONS:  Outpatient Encounter Prescriptions as of 10/02/2016  Medication Sig  . acetaminophen (TYLENOL) 500 MG tablet Take 500 mg by mouth every 6 (six) hours as needed.  Marland Kitchen amLODipine (NORVASC) 10 MG tablet Take 1 tablet (10 mg total) by mouth daily.  . Calcium-Phosphorus-Vitamin D [id number] MG-MG-UNIT CHEW Chew 2 tablets by mouth daily.  . clonazePAM (KLONOPIN) 0.5 MG tablet Take 1 tablet (0.5 mg total) by mouth 2 (two) times daily as needed. for anxiety  . cycloSPORINE (RESTASIS) 0.05 % ophthalmic emulsion Place 1 drop into both eyes 2 (two) times daily.  . ferrous sulfate 325 (65 FE) MG tablet Take 325 mg by mouth daily with breakfast.  . hydrochlorothiazide (MICROZIDE) 12.5 MG capsule Take 6.25 mg by mouth 2 (two) times daily.  Marland Kitchen lidocaine-prilocaine (EMLA) cream APPLY A QUARTER SIZE AMOUNT TO PORT SITE 1 HOURS PRIOR TO CHEMO DO NOT RUB IN COVER WITH PLASTIC WRAP  . metoprolol tartrate (LOPRESSOR) 25 MG tablet Take 0.5 tablets (12.5 mg total) by mouth 2 (two) times daily.  . Multiple Vitamins-Minerals (EQ ONE DAILY WOMENS 50+) TABS Take 1 tablet by mouth daily.  . ondansetron (ZOFRAN)  8 MG tablet Take 1 tablet (8 mg total) by mouth 2 (two) times daily as needed (Nausea or vomiting).  . pantoprazole (PROTONIX) 20 MG tablet TAKE 1 TABLET(20 MG) BY MOUTH  DAILY  . Potassium 99 MG TABS Take 1 tablet by mouth daily.  . potassium chloride SA (K-DUR,KLOR-CON) 20 MEQ tablet Take 1 tablet (20 mEq total) by mouth 2 (two) times daily. (Patient not taking: Reported on 09/24/2016)  . predniSONE (DELTASONE) 20 MG tablet Take 1 tablet (20 mg total) by mouth daily with breakfast. (Patient not taking: Reported on 09/24/2016)  . prochlorperazine (COMPAZINE) 10 MG tablet Take 1 tablet (10 mg total) by mouth every 6 (six) hours as needed (Nausea or vomiting).  . traMADol (ULTRAM) 50 MG tablet One tablet twice daily as needed for pain  . Turmeric Curcumin 500 MG CAPS Take 1 capsule by mouth daily.   Facility-Administered Encounter Medications as of 10/02/2016  Medication  . dexamethasone (DECADRON) 10 mg in sodium chloride 0.9 % 50 mL IVPB    ALLERGIES:  Allergies  Allergen Reactions  . Iohexol Hives and Shortness Of Breath    PATIENT HAD TO BE TAKEN TO ED, C/O WHELPS, HIVES, DIFFICULTY BREATHING  . Aciphex [Rabeprazole Sodium] Other (See Comments)    unknown  . Amlodipine Besylate-Valsartan     Rash to high-dose (5/320)  . Esomeprazole Magnesium   . Omeprazole Other (See Comments)    Patient states "medication didn't work"  . Penicillins Other (See Comments)    unknown  . Ciprofloxacin Rash     PHYSICAL EXAM:  ECOG Performance status: 1-2 - Symptomatic, but largely independent; may require occasional assistance.      Physical Exam  Constitutional: She is oriented to person, place, and time and well-developed, well-nourished, and in no distress.  Seen in treatment chair in infusion area   HENT:  Head: Normocephalic.  Mouth/Throat: Oropharynx is clear and moist.  Eyes: Conjunctivae are normal. No scleral icterus.  Neck: Normal range of motion. Neck supple.  Cardiovascular:  Regular rhythm.   Bradycardic  Pulmonary/Chest: Effort normal and breath sounds normal. No respiratory distress. She has no wheezes. She has no rales.  Abdominal: Soft. Bowel sounds are normal. She exhibits no distension. There is no tenderness. There is no rebound and no guarding.  Musculoskeletal: Normal range of motion. She exhibits no edema.  Mild tenderness to palpation of right ankle (reports h/o ankle fracture with intermittent pain to this area).   Lymphadenopathy:    She has no cervical adenopathy.  Neurological: She is alert and oriented to person, place, and time. No cranial nerve deficit.  Skin: Skin is warm and dry. No rash noted.  Psychiatric: Mood, memory, affect and judgment normal.  Nursing note and vitals reviewed.    LABORATORY DATA:  I have reviewed the labs as listed.  CBC    Component Value Date/Time   WBC 3.3 (L) 10/02/2016 1002   RBC 3.85 (L) 10/02/2016 1002   HGB 11.0 (L) 10/02/2016 1002   HCT 32.4 (L) 10/02/2016 1002   PLT 86 (L) 10/02/2016 1002   MCV 84.2 10/02/2016 1002   MCH 28.6 10/02/2016 1002   MCHC 34.0 10/02/2016 1002   RDW 14.4 10/02/2016 1002   LYMPHSABS 1.2 10/02/2016 1002   MONOABS 0.2 10/02/2016 1002   EOSABS 0.0 10/02/2016 1002   BASOSABS 0.0 10/02/2016 1002   CMP Latest Ref Rng & Units 10/02/2016 09/25/2016 08/28/2016  Glucose 65 - 99 mg/dL 135(H) 103(H) 122(H)  BUN 6 - 20 mg/dL 14 22(H) 17  Creatinine 0.44 - 1.00 mg/dL 1.07(H) 1.14(H) 1.09(H)  Sodium 135 - 145 mmol/L 138 138 140  Potassium 3.5 - 5.1 mmol/L 3.8 4.3 3.7  Chloride  101 - 111 mmol/L 101 102 102  CO2 22 - 32 mmol/L 29 29 30   Calcium 8.9 - 10.3 mg/dL 8.6(L) 8.8(L) 9.4  Total Protein 6.5 - 8.1 g/dL 6.7 6.7 7.4  Total Bilirubin 0.3 - 1.2 mg/dL 0.5 0.5 0.9  Alkaline Phos 38 - 126 U/L 122 159(H) 142(H)  AST 15 - 41 U/L 22 26 23   ALT 14 - 54 U/L 16 15 14      PENDING LABS:    DIAGNOSTIC IMAGING:  MRI abd: 09/11/16 *Images and radiologic reports reviewed independently and  I agree with below findings.         PATHOLOGY:  Liver biopsy: 03/09/14          ASSESSMENT & PLAN:   Stage IV pancreatic cancer with liver mets:  -Metastatic disease noted 03/10/14 with biopsy confirmed liver mets. Received chemotherapy with Gemzar/Abraxane on days 1 & 8 every 28 day cycle; CA 19-9 normalized and subsequent imaging revealed response and later no residual detectable liver mets in 07/24/15. Chemotherapy drug holiday started in 02/2016, after 2 years of chemotherapy. Recent MRI 09/11/16 indicates progression of pancreatic tail lesion & right liver with concern for recurrent metastatic disease. CA 19-9 is rising as well. -Reviewed and discussed in detail the findings of recent MRI imaging with patient again today.  I reviewed the actual radiology images with her as well. Reiterated that her cancer has recurred based on these imaging findings and elevated CA 19-9. Reinforced that her cancer remains treatable, but is not curable.  She voices understanding.   -Due for day 8 of cycle #1 Gemzar/Abraxane today.  Labs reviewed. Platelets slightly low at 86,000. Discussed with Dr. Talbert Cage. We will proceed with treatment today.  She may require dose-reduction in the future for cytopenias if they persist.  Also, could consider Gemzar alone if she is unable to tolerate doublet therapy.  -Return to cancer center as scheduled for next cycle chemotherapy with Gemzar/Abraxane on days 1 & 8 every 28 days. Day 15 omitted given previous cytopenias with initial chemotherapy in 2015. -I will plan to follow-up with her on day 8 of cycle #2.   Decreased appetite:  -Weight is largely stable.  -Albumin mildly low today at 3.1. Encouraged her to drink Ensure/Boost if she does not have appetite for solid foods. I stressed the importance of maintaining her weight with adequate calories and protein during treatment. She agreed to work on this.  -Will refer to Nutrition at next visit if she has weight loss  or worsening hypoalbuminemia.             Dispo:  -Return to cancer center as scheduled for cycle #2 chemotherapy.  -Follow-up visit on day 8, cycle #2 of Gemzar/Abraxane.    All questions were answered to patient's stated satisfaction. Encouraged patient to call with any new concerns or questions before her next visit to the cancer center and we can certain see her sooner, if needed.    Plan of care discussed with Dr. Talbert Cage, who agrees with the above aforementioned.    A total of 25 minutes was spent in face-to-face care of this patient, with greater than 50% of that time spent in counseling and care-coordination.    Orders placed this encounter:  No orders of the defined types were placed in this encounter.     Mike Craze, NP [place] [phone]

## 2016-10-02 NOTE — Progress Notes (Signed)
Labs reviewed with MD. Proceed with treatment today. Chemotherapy given today per orders. Vitals stable and discharged home from clinic ambulatory.follow up as scheduled.

## 2016-10-03 LAB — CANCER ANTIGEN 19-9: CA 19-9: 508 U/mL — ABNORMAL HIGH (ref 0–35)

## 2016-10-05 ENCOUNTER — Encounter (HOSPITAL_COMMUNITY): Payer: Self-pay | Admitting: Emergency Medicine

## 2016-10-05 ENCOUNTER — Emergency Department (HOSPITAL_COMMUNITY): Payer: Medicare HMO

## 2016-10-05 ENCOUNTER — Observation Stay (HOSPITAL_COMMUNITY)
Admission: EM | Admit: 2016-10-05 | Discharge: 2016-10-07 | Disposition: A | Discharge status: Home / Self Care | Payer: Medicare HMO | Attending: Internal Medicine | Admitting: Internal Medicine

## 2016-10-05 DIAGNOSIS — Z66 Do not resuscitate: Secondary | ICD-10-CM

## 2016-10-05 DIAGNOSIS — Z87891 Personal history of nicotine dependence: Secondary | ICD-10-CM | POA: Diagnosis not present

## 2016-10-05 DIAGNOSIS — C259 Malignant neoplasm of pancreas, unspecified: Secondary | ICD-10-CM | POA: Diagnosis present

## 2016-10-05 DIAGNOSIS — Z8507 Personal history of malignant neoplasm of pancreas: Secondary | ICD-10-CM | POA: Insufficient documentation

## 2016-10-05 DIAGNOSIS — I10 Essential (primary) hypertension: Secondary | ICD-10-CM | POA: Insufficient documentation

## 2016-10-05 DIAGNOSIS — M7989 Other specified soft tissue disorders: Secondary | ICD-10-CM | POA: Diagnosis not present

## 2016-10-05 DIAGNOSIS — C787 Secondary malignant neoplasm of liver and intrahepatic bile duct: Secondary | ICD-10-CM

## 2016-10-05 DIAGNOSIS — Z79899 Other long term (current) drug therapy: Secondary | ICD-10-CM | POA: Insufficient documentation

## 2016-10-05 DIAGNOSIS — L03116 Cellulitis of left lower limb: Principal | ICD-10-CM

## 2016-10-05 DIAGNOSIS — D6481 Anemia due to antineoplastic chemotherapy: Secondary | ICD-10-CM | POA: Diagnosis not present

## 2016-10-05 DIAGNOSIS — M25572 Pain in left ankle and joints of left foot: Secondary | ICD-10-CM | POA: Diagnosis not present

## 2016-10-05 DIAGNOSIS — M109 Gout, unspecified: Secondary | ICD-10-CM | POA: Diagnosis present

## 2016-10-05 DIAGNOSIS — T451X5A Adverse effect of antineoplastic and immunosuppressive drugs, initial encounter: Secondary | ICD-10-CM

## 2016-10-05 DIAGNOSIS — M79605 Pain in left leg: Secondary | ICD-10-CM | POA: Diagnosis present

## 2016-10-05 LAB — BASIC METABOLIC PANEL
Anion gap: 8 (ref 5–15)
BUN: 22 mg/dL — ABNORMAL HIGH (ref 6–20)
CHLORIDE: 103 mmol/L (ref 101–111)
CO2: 27 mmol/L (ref 22–32)
Calcium: 8.5 mg/dL — ABNORMAL LOW (ref 8.9–10.3)
Creatinine, Ser: 1.07 mg/dL — ABNORMAL HIGH (ref 0.44–1.00)
GFR, EST AFRICAN AMERICAN: 53 mL/min — AB (ref >60)
GFR, EST NON AFRICAN AMERICAN: 46 mL/min — AB (ref >60)
Glucose, Bld: 128 mg/dL — ABNORMAL HIGH (ref 65–99)
POTASSIUM: 3.7 mmol/L (ref 3.5–5.1)
Sodium: 138 mmol/L (ref 135–145)

## 2016-10-05 LAB — URIC ACID: Uric Acid, Serum: 5.8 mg/dL (ref 2.3–6.6)

## 2016-10-05 LAB — CBC WITH DIFFERENTIAL/PLATELET
BASOS ABS: 0 10*3/uL (ref 0.0–0.1)
Basophils Relative: 0 %
EOS ABS: 0.1 10*3/uL (ref 0.0–0.7)
Eosinophils Relative: 1 %
HCT: 29.5 % — ABNORMAL LOW (ref 36.0–46.0)
HEMOGLOBIN: 10 g/dL — AB (ref 12.0–15.0)
LYMPHS PCT: 6 %
Lymphs Abs: 0.4 10*3/uL — ABNORMAL LOW (ref 0.7–4.0)
MCH: 28.2 pg (ref 26.0–34.0)
MCHC: 33.9 g/dL (ref 30.0–36.0)
MCV: 83.3 fL (ref 78.0–100.0)
Monocytes Absolute: 0 10*3/uL — ABNORMAL LOW (ref 0.1–1.0)
Monocytes Relative: 0 %
Neutro Abs: 6.9 10*3/uL (ref 1.7–7.7)
Neutrophils Relative %: 93 %
PLATELETS: 64 10*3/uL — AB (ref 150–400)
RBC: 3.54 MIL/uL — AB (ref 3.87–5.11)
RDW: 14.1 % (ref 11.5–15.5)
WBC: 7.4 10*3/uL (ref 4.0–10.5)

## 2016-10-05 LAB — LACTIC ACID, PLASMA: LACTIC ACID, VENOUS: 1 mmol/L (ref 0.5–1.9)

## 2016-10-05 MED ORDER — SODIUM CHLORIDE 0.9 % IV SOLN
INTRAVENOUS | Status: DC
  Administered 2016-10-06 – 2016-10-07 (×3): via INTRAVENOUS

## 2016-10-05 MED ORDER — CYCLOSPORINE 0.05 % OP EMUL
1.0000 [drp] | Freq: Two times a day (BID) | OPHTHALMIC | Status: DC
  Administered 2016-10-06 (×3): 1 [drp] via OPHTHALMIC
  Filled 2016-10-05 (×4): qty 1

## 2016-10-05 MED ORDER — ADULT MULTIVITAMIN W/MINERALS CH
1.0000 | ORAL_TABLET | Freq: Every day | ORAL | Status: DC
  Administered 2016-10-06: 1 via ORAL
  Filled 2016-10-05 (×2): qty 1

## 2016-10-05 MED ORDER — PANTOPRAZOLE SODIUM 40 MG PO TBEC
40.0000 mg | DELAYED_RELEASE_TABLET | Freq: Every day | ORAL | Status: DC
Start: 2016-10-06 — End: 2016-10-07
  Administered 2016-10-06: 40 mg via ORAL
  Filled 2016-10-05 (×2): qty 1

## 2016-10-05 MED ORDER — VANCOMYCIN HCL IN DEXTROSE 1-5 GM/200ML-% IV SOLN
1000.0000 mg | Freq: Once | INTRAVENOUS | Status: AC
  Administered 2016-10-05: 1000 mg via INTRAVENOUS
  Filled 2016-10-05: qty 200

## 2016-10-05 MED ORDER — CALCIUM CARBONATE-VITAMIN D 500-200 MG-UNIT PO TABS
2.0000 | ORAL_TABLET | Freq: Every day | ORAL | Status: DC
  Administered 2016-10-06: 2 via ORAL
  Filled 2016-10-05 (×2): qty 2

## 2016-10-05 MED ORDER — COLCHICINE 0.6 MG PO TABS
0.6000 mg | ORAL_TABLET | Freq: Two times a day (BID) | ORAL | Status: DC
  Administered 2016-10-06 (×2): 0.6 mg via ORAL
  Filled 2016-10-05 (×2): qty 1

## 2016-10-05 MED ORDER — TURMERIC CURCUMIN 500 MG PO CAPS: 1.0000 | ORAL_CAPSULE | Freq: Every day | ORAL | Status: DC

## 2016-10-05 MED ORDER — ACETAMINOPHEN 500 MG PO TABS
500.0000 mg | ORAL_TABLET | ORAL | Status: DC | PRN
  Administered 2016-10-06: 500 mg via ORAL
  Filled 2016-10-05: qty 1

## 2016-10-05 MED ORDER — METOPROLOL TARTRATE 25 MG PO TABS
12.5000 mg | ORAL_TABLET | Freq: Two times a day (BID) | ORAL | Status: DC
  Administered 2016-10-06 (×3): 12.5 mg via ORAL
  Filled 2016-10-05 (×4): qty 1

## 2016-10-05 MED ORDER — AMLODIPINE BESYLATE 5 MG PO TABS
10.0000 mg | ORAL_TABLET | Freq: Every day | ORAL | Status: DC
  Administered 2016-10-06: 10 mg via ORAL
  Filled 2016-10-05 (×2): qty 2

## 2016-10-05 MED ORDER — FENTANYL CITRATE (PF) 100 MCG/2ML IJ SOLN: 12.5000 ug | INTRAMUSCULAR | Status: DC | PRN

## 2016-10-05 MED ORDER — HYDROCORTISONE NA SUCCINATE PF 100 MG IJ SOLR
50.0000 mg | Freq: Three times a day (TID) | INTRAMUSCULAR | Status: DC
  Administered 2016-10-06 (×2): 50 mg via INTRAVENOUS
  Filled 2016-10-05 (×2): qty 2

## 2016-10-05 MED ORDER — POTASSIUM 99 MG PO TABS: 1.0000 | ORAL_TABLET | Freq: Every day | ORAL | Status: DC

## 2016-10-05 MED ORDER — CLONAZEPAM 0.5 MG PO TABS
0.5000 mg | ORAL_TABLET | Freq: Two times a day (BID) | ORAL | Status: DC | PRN
  Administered 2016-10-06: 0.5 mg via ORAL
  Filled 2016-10-05: qty 1

## 2016-10-05 NOTE — ED Triage Notes (Signed)
Lt lower leg/foot swelling and redness x 2weeks.  Pain is worse for past 2 days and area has gotten red.

## 2016-10-05 NOTE — ED Notes (Signed)
Attempted to call report to third floor- nurse not available for report, says will call back.

## 2016-10-05 NOTE — ED Provider Notes (Signed)
[facility] DEPT Provider Note   CSN: [id number] Arrival date & time: 10/05/16  1903     History   Chief Complaint Chief Complaint  Patient presents with  . Leg Pain    HPI [name] is a 83 y.o. female.  HPI  Pt was seen at [facility]. Per pt, c/o gradual onset and worsening of persistent left ankle "redness" that began today. Pt states her ankle was "a little swollen" yesterday, which has gotten worse today. LD chemotherapy: 5/17 and 5/24.  Denies fevers, no other areas of rash, no injury, no open wounds, no focal motor weakness, no tingling/numbness in extremities.   Past Medical History:  Diagnosis Date  . Anemia due to antineoplastic chemotherapy 09/12/2015   Started Aranesp 500 mcg on 09/12/2015  . Anxiety   . Chronic diarrhea   . Depression   . DNR (do not resuscitate) 08/16/2015  . Erosive esophagitis   . GERD (gastroesophageal reflux disease)   . Hx of adenomatous colonic polyps    tubular adenomas, last found in 2008  . Hyperplastic colon polyp 03/19/10   tcs by Dr. Gala Romney  . Hypertension 20 years   . Kidney stone    hx/ crushed   . Opioid contract exists 04/18/2015   With Dr. Moshe Cipro  . Pancreatic cancer ([place]) 02/2014  . Pancreatic cancer metastasized to liver ([place]) 03/10/2014  . Schatzki's ring 08/26/10   Last dilated on EGD by Dr. Trevor Iha [facility], linear gastric erosions, BI hemigastrectomy    Patient Active Problem List   Diagnosis Date Noted  . At high risk for falls 09/22/2015  . Anemia due to antineoplastic chemotherapy 09/12/2015  . DNR (do not resuscitate) 08/16/2015  . Chronic low back pain with sciatica 06/03/2015  . Hx of adenomatous colonic polyps 04/24/2015  . Right knee pain 04/14/2015  . Allergic rhinitis 12/12/2014  . Pancreatic cancer metastasized to liver ([place]) 03/10/2014  . Abnormal liver ultrasound 02/27/2014  . Anemia, iron deficiency 11/03/2013  . IGT (impaired glucose tolerance) 02/19/2013  . Primary generalized (osteo)arthritis  05/12/2012  . Anxiety 09/15/2011  . GERD (gastroesophageal reflux disease) 08/20/2010  . Essential hypertension 01/07/2010    Past Surgical History:  Procedure Laterality Date  . BREAST LUMPECTOMY Left   . bunion removal     from both feet   . CHOLECYSTECTOMY  1965   . COLONOSCOPY  03/19/2010   DR Gala Romney,, normal TI, pancolonic diverticula, random colon bx neg., hyperplastic polyps removed  . ESOPHAGOGASTRODUODENOSCOPY  11/22/2003   DR Gala Romney, erosive RE, Billroth I  . ESOPHAGOGASTRODUODENOSCOPY  08/26/10   Dr. Gala Romney- moderate severe ERE, Scahtzki ring s/p dilation, Billroth I, linear gastric erosions, bx-gastric xanthelasma  . LEFT SHOULDER SURGERY  2009   DR HARRISON  . ORIF ANKLE FRACTURE Right 05/22/2013   Procedure: OPEN REDUCTION INTERNAL FIXATION (ORIF) RIGHT ANKLE FRACTURE;  Surgeon: Sanjuana Kava, MD;  Location: [facility];  Service: Orthopedics;  Laterality: Right;  . stomach ulcer  50 years ago    had some of her stomach removed     OB History    No data available       Home Medications    Prior to Admission medications   Medication Sig Start Date End Date Taking? Authorizing Provider  acetaminophen (TYLENOL) 500 MG tablet Take 500 mg by mouth every 6 (six) hours as needed.    [provider]  amLODipine (NORVASC) 10 MG tablet Take 1 tablet (10 mg total) by mouth daily. 10/24/15   [name]  E, MD  Calcium-Phosphorus-Vitamin D 250-100-500 MG-MG-UNIT CHEW Chew 2 tablets by mouth daily.    [provider]  clonazePAM (KLONOPIN) 0.5 MG tablet Take 1 tablet (0.5 mg total) by mouth 2 (two) times daily as needed. for anxiety 06/16/16   Fayrene Helper, MD  cycloSPORINE (RESTASIS) 0.05 % ophthalmic emulsion Place 1 drop into both eyes 2 (two) times daily.    [provider]  ferrous sulfate 325 (65 FE) MG tablet Take 325 mg by mouth daily with breakfast.    [provider]  hydrochlorothiazide (MICROZIDE) 12.5 MG capsule Take 6.25 mg by  mouth 2 (two) times daily.    [provider]  lidocaine-prilocaine (EMLA) cream APPLY A QUARTER SIZE AMOUNT TO PORT SITE 1 HOURS PRIOR TO CHEMO DO NOT RUB IN COVER WITH PLASTIC WRAP 09/10/15   Penland, Kelby Fam, MD  metoprolol tartrate (LOPRESSOR) 25 MG tablet Take 0.5 tablets (12.5 mg total) by mouth 2 (two) times daily. 11/19/15 11/18/16  Fayrene Helper, MD  Multiple Vitamins-Minerals (EQ ONE DAILY WOMENS 50+) TABS Take 1 tablet by mouth daily.    [provider]  ondansetron (ZOFRAN) 8 MG tablet Take 1 tablet (8 mg total) by mouth 2 (two) times daily as needed (Nausea or vomiting). 09/18/16   Holley Bouche, NP  pantoprazole (PROTONIX) 20 MG tablet TAKE 1 TABLET(20 MG) BY MOUTH DAILY 11/20/15   Fayrene Helper, MD  Potassium 99 MG TABS Take 1 tablet by mouth daily.    [provider]  potassium chloride SA (K-DUR,KLOR-CON) 20 MEQ tablet Take 1 tablet (20 mEq total) by mouth 2 (two) times daily. Patient not taking: Reported on 09/24/2016 06/23/16   Baird Cancer, PA-C  predniSONE (DELTASONE) 20 MG tablet Take 1 tablet (20 mg total) by mouth daily with breakfast. Patient not taking: Reported on 09/24/2016 08/28/16   Raylene Everts, MD  prochlorperazine (COMPAZINE) 10 MG tablet Take 1 tablet (10 mg total) by mouth every 6 (six) hours as needed (Nausea or vomiting). 09/18/16   Holley Bouche, NP  traMADol Veatrice Bourbon) 50 MG tablet One tablet twice daily as needed for pain 08/20/16   Fayrene Helper, MD  Turmeric Curcumin 500 MG CAPS Take 1 capsule by mouth daily.    [provider]    Family History Family History  Problem Relation Age of Onset  . Hypertension Mother   . Kidney failure Brother        on dialysis  . Diabetes Sister   . Diabetes Sister   . Hypertension Father   . Kidney failure Sister   . Kidney Stones Brother   . Breast cancer Son   . Gout Son   . Liver disease Neg Hx   . Colon cancer Neg Hx     Social History Social  History  Substance Use Topics  . Smoking status: Former Smoker    Packs/day: 0.50    Years: 60.00    Types: Cigarettes    Quit date: 09/09/2016  . Smokeless tobacco: Never Used  . Alcohol use No     Allergies   Iohexol; Aciphex [rabeprazole sodium]; Amlodipine besylate-valsartan; Esomeprazole magnesium; Omeprazole; Penicillins; and Ciprofloxacin   Review of Systems Review of Systems ROS: Statement: All systems negative except as marked or noted in the HPI; Constitutional: Negative for fever and chills. ; ; Eyes: Negative for eye pain, redness and discharge. ; ; ENMT: Negative for ear pain, hoarseness, nasal congestion, sinus pressure and sore throat. ; ;  Cardiovascular: Negative for chest pain, palpitations, diaphoresis, dyspnea. ; ; Respiratory: Negative for cough, wheezing and stridor. ; ; Gastrointestinal: Negative for nausea, vomiting, diarrhea, abdominal pain, blood in stool, hematemesis, jaundice and rectal bleeding. . ; ; Genitourinary: Negative for dysuria, flank pain and hematuria. ; ; Musculoskeletal: +left ankle edema. Negative for back pain and neck pain. Negative for trauma.; ; Skin: +rash. Negative for pruritus, abrasions, blisters, bruising and skin lesion.; ; Neuro: Negative for headache, lightheadedness and neck stiffness. Negative for weakness, altered level of consciousness, altered mental status, extremity weakness, paresthesias, involuntary movement, seizure and syncope.       Physical Exam Updated Vital Signs Ht 5\' 4"  (1.626 m)   Wt 59.9 kg (132 lb)   BMI 22.66 kg/m   Physical Exam 1940: Physical examination:  Nursing notes reviewed; Vital signs and O2 SAT reviewed;  Constitutional: Well developed, Well nourished, Well hydrated, In no acute distress; Head:  Normocephalic, atraumatic; Eyes: EOMI, PERRL, No scleral icterus; ENMT: Mouth and pharynx normal, Mucous membranes moist; Neck: Supple, Full range of motion, No lymphadenopathy; Cardiovascular: Regular rate and  rhythm, No gallop; Respiratory: Breath sounds clear & equal bilaterally, No wheezes.  Speaking full sentences with ease, Normal respiratory effort/excursion; Chest: Nontender, Movement normal; Abdomen: Soft, Nontender, Nondistended, Normal bowel sounds; Genitourinary: No CVA tenderness; Extremities: Pulses normal, No deformity. +2 edema left ankle with circumferential erythema and warmth from proximal dorsal foot to distal lower leg. +pedal pulses bilat. No calf edema, tenderness, or asymmetry.; Neuro: AA&Ox3, Major CN grossly intact.  Speech clear. No gross focal motor or sensory deficits in extremities.; Skin: Color normal, Warm, Dry.   ED Treatments / Results  Labs (all labs ordered are listed, but only abnormal results are displayed)   EKG  EKG Interpretation None       Radiology   Procedures Procedures (including critical care time)  Medications Ordered in ED Medications - No data to display   Initial Impression / Assessment and Plan / ED Course  I have reviewed the triage vital signs and the nursing notes.  Pertinent labs & imaging results that were available during my care of the patient were reviewed by me and considered in my medical decision making (see chart for details).  MDM Reviewed: previous chart, nursing note and vitals Reviewed previous: labs Interpretation: labs and x-ray   Results for orders placed or performed during the hospital encounter of 66/06/30  Basic metabolic panel  Result Value Ref Range   Sodium 138 135 - 145 mmol/L   Potassium 3.7 3.5 - 5.1 mmol/L   Chloride 103 101 - 111 mmol/L   CO2 27 22 - 32 mmol/L   Glucose, Bld 128 (H) 65 - 99 mg/dL   BUN 22 (H) 6 - 20 mg/dL   Creatinine, Ser 1.07 (H) 0.44 - 1.00 mg/dL   Calcium 8.5 (L) 8.9 - 10.3 mg/dL   GFR calc non Af Amer 46 (L) >60 mL/min   GFR calc Af Amer 53 (L) >60 mL/min   Anion gap 8 5 - 15  CBC with Differential  Result Value Ref Range   WBC 7.4 4.0 - 10.5 K/uL   RBC 3.54 (L) 3.87  - 5.11 MIL/uL   Hemoglobin 10.0 (L) 12.0 - 15.0 g/dL   HCT 29.5 (L) 36.0 - 46.0 %   MCV 83.3 78.0 - 100.0 fL   MCH 28.2 26.0 - 34.0 pg   MCHC 33.9 30.0 - 36.0 g/dL   RDW 14.1 11.5 - 15.5 %  Platelets 64 (L) 150 - 400 K/uL   Neutrophils Relative % 93 %   Neutro Abs 6.9 1.7 - 7.7 K/uL   Lymphocytes Relative 6 %   Lymphs Abs 0.4 (L) 0.7 - 4.0 K/uL   Monocytes Relative 0 %   Monocytes Absolute 0.0 (L) 0.1 - 1.0 K/uL   Eosinophils Relative 1 %   Eosinophils Absolute 0.1 0.0 - 0.7 K/uL   Basophils Relative 0 %   Basophils Absolute 0.0 0.0 - 0.1 K/uL  Lactic acid, plasma  Result Value Ref Range   Lactic Acid, Venous 1.0 0.5 - 1.9 mmol/L   Dg Ankle Complete Left Result Date: 10/05/2016 CLINICAL DATA:  Left lower leg and foot swelling and erythema for 2 weeks. No reported acute injury. EXAM: LEFT ANKLE COMPLETE - 3+ VIEW COMPARISON:  None. FINDINGS: The bones appear mildly demineralized. No evidence of acute fracture, dislocation or bone destruction. The subtalar joint is ill-defined on the lateral view, but there is no associated talar beaking to confirm subtalar coalition. There is diffuse soft tissue prominence around the ankle. Scattered vascular calcifications are noted. IMPRESSION: Nonspecific soft tissue prominence around the ankle, possibly swelling. No acute osseous findings. Electronically Signed   By: Richardean Sale M.D.   On: 10/05/2016 20:26    2110:  WBC low 3 days ago, now normal. Given LD chemo was 5/17 and 5/24, would believe WBC count should continue to drop. Will dose IV abx. Dx and testing d/w pt and family.  Questions answered.  Verb understanding, agreeable to admit.   T/C to [facility] Dr. Marin Comment, case discussed, including:  HPI, pertinent PM/SHx, VS/PE, dx testing, ED course and treatment:  Agreeable to come to ED for evaluation.     Final Clinical Impressions(s) / ED Diagnoses   Final diagnoses:  None    New Prescriptions New Prescriptions   No medications on file       Francine Graven, DO 10/08/16 1547

## 2016-10-05 NOTE — H&P (Signed)
History and Physical    Cerita Rabelo YTK:[id number] DOB: 02-Jun-1928 DOA: 10/05/2016  PCP: Fayrene Helper, MD  Patient coming from: Home.    Chief Complaint:  Acute swelling of the left ankle today.   HPI: [name] is an 83 y.o. female with hx of pancreatic cancer with liver mets, undergoing chemotherapy, chronic anemia, HTN, anxiety and depression, DNR code status, presented to the ER with acute swelling of the left ankle, with redness and pain this morning.  She has some subjective fever, but no chills.  She has been on HCTZ for her HTN.  Work up in the [facility] [facility] no leukocytosis, normal renal Fx test, and unremarkable serology.  She has xray of the left ankle, only showed some swelling.  She was given IV vancomycin, and hospitalist was asked to admit her for cellulitis.    ED Course:  See above.  Rewiew of Systems:  Constitutional: Negative for malaise, fever and chills. No significant weight loss or weight gain Eyes: Negative for eye pain, redness and discharge, diplopia, visual changes, or flashes of light. ENMT: Negative for ear pain, hoarseness, nasal congestion, sinus pressure and sore throat. No headaches; tinnitus, drooling, or problem swallowing. Cardiovascular: Negative for chest pain, palpitations, diaphoresis, dyspnea and peripheral edema. ; No orthopnea, PND Respiratory: Negative for cough, hemoptysis, wheezing and stridor. No pleuritic chestpain. Gastrointestinal: Negative for diarrhea, constipation,  melena, blood in stool, hematemesis, jaundice and rectal bleeding.    Genitourinary: Negative for frequency, dysuria, incontinence,flank pain and hematuria; Musculoskeletal: Negative for back pain and neck pain. Negative for swelling and trauma.;  Skin: . Negative for pruritus, rash, abrasions, bruising , and ulcerations Neuro: Negative for headache, lightheadedness and neck stiffness. Negative for weakness, altered level of consciousness , altered mental status,  extremity weakness, burning feet, involuntary movement, seizure and syncope.  Psych: negative for anxiety, depression, insomnia, tearfulness, panic attacks, hallucinations, paranoia, suicidal or homicidal ideation   Past Medical History:  Diagnosis Date  . Anemia due to antineoplastic chemotherapy 09/12/2015   Started Aranesp 500 mcg on 09/12/2015  . Anxiety   . Chronic diarrhea   . Depression   . DNR (do not resuscitate) 08/16/2015  . Erosive esophagitis   . GERD (gastroesophageal reflux disease)   . Hx of adenomatous colonic polyps    tubular adenomas, last found in 2008  . Hyperplastic colon polyp 03/19/10   tcs by Dr. Gala Romney  . Hypertension 20 years   . Kidney stone    hx/ crushed   . Opioid contract exists 04/18/2015   With Dr. Moshe Cipro  . Pancreatic cancer ([place]) 02/2014  . Pancreatic cancer metastasized to liver ([place]) 03/10/2014  . Schatzki's ring 08/26/10   Last dilated on EGD by Dr. Trevor Iha [facility], linear gastric erosions, BI hemigastrectomy    Past Surgical History:  Procedure Laterality Date  . BREAST LUMPECTOMY Left   . bunion removal     from both feet   . CHOLECYSTECTOMY  1965   . COLONOSCOPY  03/19/2010   DR Gala Romney,, normal TI, pancolonic diverticula, random colon bx neg., hyperplastic polyps removed  . ESOPHAGOGASTRODUODENOSCOPY  11/22/2003   DR Gala Romney, erosive RE, Billroth I  . ESOPHAGOGASTRODUODENOSCOPY  08/26/10   Dr. Gala Romney- moderate severe ERE, Scahtzki ring s/p dilation, Billroth I, linear gastric erosions, bx-gastric xanthelasma  . LEFT SHOULDER SURGERY  2009   DR HARRISON  . ORIF ANKLE FRACTURE Right 05/22/2013   Procedure: OPEN REDUCTION INTERNAL FIXATION (ORIF) RIGHT ANKLE FRACTURE;  Surgeon: Sanjuana Kava, MD;  Location: [facility];  Service: Orthopedics;  Laterality: Right;  . stomach ulcer  50 years ago    had some of her stomach removed      reports that she quit smoking about 3 weeks ago. Her smoking use included Cigarettes. She has a 30.00 pack-year smoking  history. She has never used smokeless tobacco. She reports that she does not drink alcohol or use drugs.  Allergies  Allergen Reactions  . Iohexol Hives and Shortness Of Breath    PATIENT HAD TO BE TAKEN TO ED, C/O WHELPS, HIVES, DIFFICULTY BREATHING  . Aciphex [Rabeprazole Sodium] Other (See Comments)    unknown  . Amlodipine Besylate-Valsartan     Rash to high-dose (5/320)  . Esomeprazole Magnesium   . Omeprazole Other (See Comments)    Patient states "medication didn't work"  . Penicillins Other (See Comments)    unknown  . Ciprofloxacin Rash    Family History  Problem Relation Age of Onset  . Hypertension Mother   . Kidney failure Brother        on dialysis  . Diabetes Sister   . Diabetes Sister   . Hypertension Father   . Kidney failure Sister   . Kidney Stones Brother   . Breast cancer Son   . Gout Son   . Liver disease Neg Hx   . Colon cancer Neg Hx      Prior to Admission medications   Medication Sig Start Date End Date Taking? Authorizing Provider  acetaminophen (TYLENOL) 500 MG tablet Take 500 mg by mouth every 6 (six) hours as needed.    [provider]  amLODipine (NORVASC) 10 MG tablet Take 1 tablet (10 mg total) by mouth daily. 10/24/15   Fayrene Helper, MD  Calcium-Phosphorus-Vitamin D 250-100-500 MG-MG-UNIT CHEW Chew 2 tablets by mouth daily.    [provider]  clonazePAM (KLONOPIN) 0.5 MG tablet Take 1 tablet (0.5 mg total) by mouth 2 (two) times daily as needed. for anxiety 06/16/16   Fayrene Helper, MD  cycloSPORINE (RESTASIS) 0.05 % ophthalmic emulsion Place 1 drop into both eyes 2 (two) times daily.    [provider]  ferrous sulfate 325 (65 FE) MG tablet Take 325 mg by mouth daily with breakfast.    [provider]  hydrochlorothiazide (MICROZIDE) 12.5 MG capsule Take 6.25 mg by mouth 2 (two) times daily.    [provider]  lidocaine-prilocaine (EMLA) cream APPLY A QUARTER SIZE AMOUNT TO PORT SITE  1 HOURS PRIOR TO CHEMO DO NOT RUB IN COVER WITH PLASTIC WRAP 09/10/15   Penland, Kelby Fam, MD  metoprolol tartrate (LOPRESSOR) 25 MG tablet Take 0.5 tablets (12.5 mg total) by mouth 2 (two) times daily. 11/19/15 11/18/16  Fayrene Helper, MD  Multiple Vitamins-Minerals (EQ ONE DAILY WOMENS 50+) TABS Take 1 tablet by mouth daily.    [provider]  ondansetron (ZOFRAN) 8 MG tablet Take 1 tablet (8 mg total) by mouth 2 (two) times daily as needed (Nausea or vomiting). 09/18/16   Holley Bouche, NP  pantoprazole (PROTONIX) 20 MG tablet TAKE 1 TABLET(20 MG) BY MOUTH DAILY 11/20/15   Fayrene Helper, MD  Potassium 99 MG TABS Take 1 tablet by mouth daily.    [provider]  potassium chloride SA (K-DUR,KLOR-CON) 20 MEQ tablet Take 1 tablet (20 mEq total) by mouth 2 (two) times daily. Patient not taking: Reported on 09/24/2016 06/23/16   Baird Cancer, PA-C  predniSONE (DELTASONE) 20 MG tablet  Take 1 tablet (20 mg total) by mouth daily with breakfast. Patient not taking: Reported on 09/24/2016 08/28/16   Raylene Everts, MD  prochlorperazine (COMPAZINE) 10 MG tablet Take 1 tablet (10 mg total) by mouth every 6 (six) hours as needed (Nausea or vomiting). 09/18/16   Holley Bouche, NP  traMADol Veatrice Bourbon) 50 MG tablet One tablet twice daily as needed for pain 08/20/16   Fayrene Helper, MD  Turmeric Curcumin 500 MG CAPS Take 1 capsule by mouth daily.    [provider]    Physical Exam: Vitals:   10/05/16 1925  Weight: 59.9 kg (132 lb)  Height: 5\' 4"  (1.626 m)   Constitutional: NAD, calm, comfortable Vitals:   10/05/16 1925  Weight: 59.9 kg (132 lb)  Height: 5\' 4"  (1.626 m)   Eyes: PERRL, lids and conjunctivae normal ENMT: Mucous membranes are moist. Posterior pharynx clear of any exudate or lesions.Normal dentition.  Neck: normal, supple, no masses, no thyromegaly Respiratory: clear to auscultation bilaterally, no wheezing, no crackles. Normal  respiratory effort. No accessory muscle use.  Cardiovascular: Regular rate and rhythm, no murmurs / rubs / gallops. No extremity edema. 2+ pedal pulses. No carotid bruits.  Abdomen: no tenderness, no masses palpated. No hepatosplenomegaly. Bowel sounds positive.  Musculoskeletal: no clubbing / cyanosis. No joint deformity upper and lower extremities. Good ROM, no contractures. Normal muscle tone.  Skin: swelling and redness of the medial maleolus and left ankle.  Neurologic: CN 2-12 grossly intact. Sensation intact, DTR normal. Strength 5/5 in all 4.  Psychiatric: Normal judgment and insight. Alert and oriented x 3. Normal mood.    Labs on Admission: I have personally reviewed following labs and imaging studies CBC:  Recent Labs Lab 10/02/16 1002 10/05/16 1951  WBC 3.3* 7.4  NEUTROABS 1.9 6.9  HGB 11.0* 10.0*  HCT 32.4* 29.5*  MCV 84.2 83.3  PLT 86* 64*   Basic Metabolic Panel:  Recent Labs Lab 10/02/16 1002 10/05/16 1951  NA 138 138  K 3.8 3.7  CL 101 103  CO2 29 27  GLUCOSE 135* 128*  BUN 14 22*  CREATININE 1.07* 1.07*  CALCIUM 8.6* 8.5*   GFR: Estimated Creatinine Clearance: 33.2 mL/min (A) (by C-G formula based on SCr of 1.07 mg/dL (H)). Liver Function Tests:  Recent Labs Lab 10/02/16 1002  AST 22  ALT 16  ALKPHOS 122  BILITOT 0.5  PROT 6.7  ALBUMIN 3.1*   Urine analysis:    Component Value Date/Time   COLORURINE YELLOW 09/16/2016 0952   APPEARANCEUR CLEAR 09/16/2016 0952   LABSPEC 1.016 09/16/2016 0952   PHURINE 5.0 09/16/2016 0952   GLUCOSEU NEGATIVE 09/16/2016 0952   HGBUR MODERATE (A) 09/16/2016 0952   BILIRUBINUR NEGATIVE 09/16/2016 0952   BILIRUBINUR small 05/01/2016 1425   [place] 09/16/2016 0952   PROTEINUR >=300 (A) 09/16/2016 0952   UROBILINOGEN 0.2 05/01/2016 1425   UROBILINOGEN 0.2 10/04/2014 0920   NITRITE NEGATIVE 09/16/2016 0952   LEUKOCYTESUR NEGATIVE 09/16/2016 0952   Radiological Exams on Admission: Dg Ankle  Complete Left  Result Date: 10/05/2016 CLINICAL DATA:  Left lower leg and foot swelling and erythema for 2 weeks. No reported acute injury. EXAM: LEFT ANKLE COMPLETE - 3+ VIEW COMPARISON:  None. FINDINGS: The bones appear mildly demineralized. No evidence of acute fracture, dislocation or bone destruction. The subtalar joint is ill-defined on the lateral view, but there is no associated talar beaking to confirm subtalar coalition. There is diffuse soft tissue prominence around the ankle.  Scattered vascular calcifications are noted. IMPRESSION: Nonspecific soft tissue prominence around the ankle, possibly swelling. No acute osseous findings. Electronically Signed   By: Richardean Sale M.D.   On: 10/05/2016 20:26    EKG: Independently reviewed  Assessment/Plan Principal Problem:   Acute gout Active Problems:   Pancreatic cancer metastasized to liver ([place])   DNR (do not resuscitate)   Anemia due to antineoplastic chemotherapy    PLAN:   Acute Gout:  I suspect this is acute gout.  She is under chemotherapy, and has been on HCTZ, so she is at risk for gout.  She has no hx of gouty attack previously.  Will give IV Solucortef, and start colchicine, along with some IVF.  Cellulitis is less of a probability, but possible, so will continue with IV Lucianne Lei for now.  Would d/c HCTZ permanently.  HTN:  Continue with Norvasc.  Anemia:  Stable.   DVT prophylaxis: SCD.  Code Status: DNR.  Family Communication: 2 sisters at bedside.  Disposition Plan: To home when appropriate.  Consults called: None. Admission status: OBS>    Adelle Zachar MD FACP. [facility]  If 7PM-7AM, please contact night-coverage [email or web address] Password [facility]  10/05/2016, 9:41 PM

## 2016-10-06 DIAGNOSIS — T451X5A Adverse effect of antineoplastic and immunosuppressive drugs, initial encounter: Secondary | ICD-10-CM | POA: Diagnosis not present

## 2016-10-06 DIAGNOSIS — K529 Noninfective gastroenteritis and colitis, unspecified: Secondary | ICD-10-CM | POA: Diagnosis not present

## 2016-10-06 DIAGNOSIS — C259 Malignant neoplasm of pancreas, unspecified: Secondary | ICD-10-CM

## 2016-10-06 DIAGNOSIS — L03116 Cellulitis of left lower limb: Secondary | ICD-10-CM | POA: Diagnosis not present

## 2016-10-06 DIAGNOSIS — C787 Secondary malignant neoplasm of liver and intrahepatic bile duct: Secondary | ICD-10-CM | POA: Diagnosis not present

## 2016-10-06 DIAGNOSIS — I10 Essential (primary) hypertension: Secondary | ICD-10-CM | POA: Diagnosis not present

## 2016-10-06 DIAGNOSIS — D6481 Anemia due to antineoplastic chemotherapy: Secondary | ICD-10-CM | POA: Diagnosis not present

## 2016-10-06 LAB — BASIC METABOLIC PANEL
Anion gap: 8 (ref 5–15)
BUN: 19 mg/dL (ref 6–20)
CHLORIDE: 103 mmol/L (ref 101–111)
CO2: 26 mmol/L (ref 22–32)
CREATININE: 0.91 mg/dL (ref 0.44–1.00)
Calcium: 8.5 mg/dL — ABNORMAL LOW (ref 8.9–10.3)
GFR calc Af Amer: >60 mL/min (ref >60)
GFR calc non Af Amer: 56 mL/min — ABNORMAL LOW (ref >60)
GLUCOSE: 146 mg/dL — AB (ref 65–99)
POTASSIUM: 3.9 mmol/L (ref 3.5–5.1)
SODIUM: 137 mmol/L (ref 135–145)

## 2016-10-06 LAB — DIFFERENTIAL
BASOS PCT: 0 %
Basophils Absolute: 0 10*3/uL (ref 0.0–0.1)
EOS ABS: 0 10*3/uL (ref 0.0–0.7)
EOS PCT: 0 %
Lymphocytes Relative: 7 %
Lymphs Abs: 0.3 10*3/uL — ABNORMAL LOW (ref 0.7–4.0)
MONO ABS: 0 10*3/uL — AB (ref 0.1–1.0)
Monocytes Relative: 1 %
Neutro Abs: 4.9 10*3/uL (ref 1.7–7.7)
Neutrophils Relative %: 92 %

## 2016-10-06 LAB — CBC
HEMATOCRIT: 29.9 % — AB (ref 36.0–46.0)
Hemoglobin: 9.9 g/dL — ABNORMAL LOW (ref 12.0–15.0)
MCH: 27.7 pg (ref 26.0–34.0)
MCHC: 33.1 g/dL (ref 30.0–36.0)
MCV: 83.5 fL (ref 78.0–100.0)
PLATELETS: 62 10*3/uL — AB (ref 150–400)
RBC: 3.58 MIL/uL — ABNORMAL LOW (ref 3.87–5.11)
RDW: 14.2 % (ref 11.5–15.5)
WBC: 5.5 10*3/uL (ref 4.0–10.5)

## 2016-10-06 LAB — URIC ACID: Uric Acid, Serum: 5.3 mg/dL (ref 2.3–6.6)

## 2016-10-06 LAB — TSH: TSH: 0.721 u[IU]/mL (ref 0.350–4.500)

## 2016-10-06 MED ORDER — MUSCLE RUB 10-15 % EX CREA
TOPICAL_CREAM | CUTANEOUS | Status: DC | PRN
  Filled 2016-10-06: qty 85

## 2016-10-06 MED ORDER — ENSURE ENLIVE PO LIQD
237.0000 mL | Freq: Two times a day (BID) | ORAL | Status: DC
  Administered 2016-10-06 (×2): 237 mL via ORAL

## 2016-10-06 MED ORDER — LOPERAMIDE HCL 2 MG PO CAPS
2.0000 mg | ORAL_CAPSULE | Freq: Three times a day (TID) | ORAL | Status: DC | PRN
  Administered 2016-10-06: 2 mg via ORAL
  Filled 2016-10-06: qty 1

## 2016-10-06 MED ORDER — LOPERAMIDE HCL 2 MG PO CAPS
2.0000 mg | ORAL_CAPSULE | ORAL | Status: DC | PRN
  Administered 2016-10-06: 2 mg via ORAL
  Filled 2016-10-06: qty 1

## 2016-10-06 MED ORDER — VANCOMYCIN HCL 500 MG IV SOLR
500.0000 mg | INTRAVENOUS | Status: DC
  Administered 2016-10-06: 500 mg via INTRAVENOUS
  Filled 2016-10-06 (×2): qty 500

## 2016-10-06 MED ORDER — LOPERAMIDE HCL 2 MG PO CAPS: 2.0000 mg | ORAL_CAPSULE | ORAL | Status: DC | PRN

## 2016-10-06 NOTE — Progress Notes (Signed)
PROGRESS NOTE    [name]  OEV:[id number] DOB: [birth year] DOA: 10/05/2016 PCP: Fayrene Helper, MD    Brief Narrative:  83 yo female presents with the chief complain of acute edema on the left ankle. Patient known to have pancreatic cancer with liver mets, on chemotherapy, HTN and depression. On the day of admission, developed acute pain on the left ankle, associated with edema, erythema and subjective fevers. On the initial physical examination, blood pressure 117/75, HR 89, RR18. Mucous membranes were moist, lungs clear to auscultation, heart with rhythmic S1 and S2, no gallops or rubs, abdomen soft and non tender, left ankle with erythema and edema at the medial maleolus. Patient admitted for further evaluation.     Assessment & Plan:   Principal Problem:   Acute gout Active Problems:   Pancreatic cancer metastasized to liver [facility])   DNR (do not resuscitate)   Anemia due to antineoplastic chemotherapy   1. Left leg cellulitis. Suspected infectious process, will hold on colchicine and steroids, will continue IV antibiotic therapy with IV vancomycin to cover staph/ MRSA. Clinically improving, no signs of ankle joint involvement. Will continue pain control, follow cell count, temperature curve and cultures.   2. HTN. Systolic blood pressure 829 to 150, will continue amlodipine and metoprolol. Gentle hydration with IV fluids.   3. Anemia. Stable hb and hct, no signs of bleeding.   4. Pancreatic cancer on chemotherapy. Patient under treatment with chemotherapy with secondary immunosupressive state. Continue broad spectrum IV antibiotic therapy.   5. Chronic diarrhea. Will continue loperamide.     DVT prophylaxis: scd Code Status: DNR Family Communication:  Disposition Plan: Home    Consultants:     Procedures:    Antimicrobials:    Subjective: Improved edema but persistent erythema, pain moderate in intensity, no radiation, worse with movement, decrease range  of motion.   Objective: Vitals:   10/05/16 2330 10/06/16 0649 10/06/16 0948 10/06/16 0949  BP: (!) 154/52 (!) 141/63 (!) 158/41   Pulse: 76 66  67  Resp: 18 18    Temp: 99.4 F (37.4 C) 97.7 F (36.5 C)    TempSrc: Oral Oral    SpO2: 100% 100%    Weight: 59.9 kg (132 lb 0.9 oz)     Height: 5\' 4"  (1.626 m)       Intake/Output Summary (Last 24 hours) at 10/06/16 1020 Last data filed at 10/06/16 0900  Gross per 24 hour  Intake            527.5 ml  Output              250 ml  Net            277.5 ml   Filed Weights   10/05/16 1925 10/05/16 2330  Weight: 59.9 kg (132 lb) 59.9 kg (132 lb 0.9 oz)    Examination:  General exam: deconditioned E ENT: no pallor or icterus, oral mucosa moist.  Respiratory system: Clear to auscultation. Respiratory effort normal. No wheezing, rales or rhonchi.  Cardiovascular system: S1 & S2 heard, RRR. No JVD, murmurs, rubs, gallops or clicks. Gastrointestinal system: Abdomen is nondistended, soft and nontender. No organomegaly or masses felt. Normal bowel sounds heard. Central nervous system: Alert and oriented. No focal neurological deficits. Extremities: Symmetric 5 x 5 power. Left ankle edema, with erythema, tender to palpation, with increased local temperature. Skin: Erythema with decreased in extension, below the demarcated region.     Data Reviewed: I have personally reviewed  following labs and imaging studies  CBC:  Recent Labs Lab 10/02/16 1002 10/05/16 1951 10/06/16 0617  WBC 3.3* 7.4 5.5  NEUTROABS 1.9 6.9  --   HGB 11.0* 10.0* 9.9*  HCT 32.4* 29.5* 29.9*  MCV 84.2 83.3 83.5  PLT 86* 64* 62*   Basic Metabolic Panel:  Recent Labs Lab 10/02/16 1002 10/05/16 1951 10/06/16 0617  NA 138 138 137  K 3.8 3.7 3.9  CL 101 103 103  CO2 29 27 26   GLUCOSE 135* 128* 146*  BUN 14 22* 19  CREATININE 1.07* 1.07* 0.91  CALCIUM 8.6* 8.5* 8.5*   GFR: Estimated Creatinine Clearance: 39 mL/min (by C-G formula based on SCr of 0.91  mg/dL). Liver Function Tests:  Recent Labs Lab 10/02/16 1002  AST 22  ALT 16  ALKPHOS 122  BILITOT 0.5  PROT 6.7  ALBUMIN 3.1*   No results for input(s): LIPASE, AMYLASE in the last 168 hours. No results for input(s): AMMONIA in the last 168 hours. Coagulation Profile: No results for input(s): INR, PROTIME in the last 168 hours. Cardiac Enzymes: No results for input(s): CKTOTAL, CKMB, CKMBINDEX, TROPONINI in the last 168 hours. BNP (last 3 results) No results for input(s): PROBNP in the last 8760 hours. HbA1C: No results for input(s): HGBA1C in the last 72 hours. CBG: No results for input(s): GLUCAP in the last 168 hours. Lipid Profile: No results for input(s): CHOL, HDL, LDLCALC, TRIG, CHOLHDL, LDLDIRECT in the last 72 hours. Thyroid Function Tests:  Recent Labs  10/06/16 0617  TSH 0.721   Anemia Panel: No results for input(s): VITAMINB12, FOLATE, FERRITIN, TIBC, IRON, RETICCTPCT in the last 72 hours. Sepsis Labs:  Recent Labs Lab 10/05/16 1951  LATICACIDVEN 1.0    No results found for this or any previous visit (from the past 240 hour(s)).       Radiology Studies: Dg Ankle Complete Left  Result Date: 10/05/2016 CLINICAL DATA:  Left lower leg and foot swelling and erythema for 2 weeks. No reported acute injury. EXAM: LEFT ANKLE COMPLETE - 3+ VIEW COMPARISON:  None. FINDINGS: The bones appear mildly demineralized. No evidence of acute fracture, dislocation or bone destruction. The subtalar joint is ill-defined on the lateral view, but there is no associated talar beaking to confirm subtalar coalition. There is diffuse soft tissue prominence around the ankle. Scattered vascular calcifications are noted. IMPRESSION: Nonspecific soft tissue prominence around the ankle, possibly swelling. No acute osseous findings. Electronically Signed   By: Richardean Sale M.D.   On: 10/05/2016 20:26        Scheduled Meds: . amLODipine  10 mg Oral Daily  . calcium-vitamin D   2 tablet Oral Daily  . colchicine  0.6 mg Oral BID  . cycloSPORINE  1 drop Both Eyes BID  . feeding supplement (ENSURE ENLIVE)  237 mL Oral BID BM  . hydrocortisone sod succinate (SOLU-CORTEF) inj  50 mg Intravenous Q8H  . metoprolol tartrate  12.5 mg Oral BID  . multivitamin with minerals  1 tablet Oral Daily  . pantoprazole  40 mg Oral Daily   Continuous Infusions: . sodium chloride 75 mL/hr at 10/06/16 0014  . vancomycin       LOS: 0 days      Tawni Millers, MD [facility] Pager [phone]  If 7PM-7AM, please contact night-coverage [email or web address] Password [email or web address] 10/06/2016, 10:20 AM

## 2016-10-06 NOTE — Progress Notes (Signed)
Pharmacy Antibiotic Note  [name] is a 83 y.o. female admitted on 10/05/2016 with cellulitis.  Pharmacy has been consulted for vancomycin dosing.One gram vanc given last night  Plan: Continue vanc 500 mg IV q24 hours F/u renal function and clinical course  Height: 5\' 4"  (162.6 cm) Weight: 132 lb 0.9 oz (59.9 kg) IBW/kg (Calculated) : 54.7  Temp (24hrs), Avg:98.6 F (37 C), Min:97.7 F (36.5 C), Max:99.4 F (37.4 C)   Recent Labs Lab 10/02/16 1002 10/05/16 1951 10/06/16 0617  WBC 3.3* 7.4 5.5  CREATININE 1.07* 1.07* 0.91  LATICACIDVEN  --  1.0  --     Estimated Creatinine Clearance: 39 mL/min (by C-G formula based on SCr of 0.91 mg/dL).    Allergies  Allergen Reactions  . Iohexol Hives and Shortness Of Breath    PATIENT HAD TO BE TAKEN TO ED, C/O WHELPS, HIVES, DIFFICULTY BREATHING  . Aciphex [Rabeprazole Sodium] Other (See Comments)    unknown  . Amlodipine Besylate-Valsartan     Rash to high-dose (5/320)  . Esomeprazole Magnesium   . Omeprazole Other (See Comments)    Patient states "medication didn't work"  . Ciprofloxacin Rash  . Penicillins Swelling and Rash    Has patient had a PCN reaction causing immediate rash, facial/tongue/throat swelling, SOB or lightheadedness with hypotension: Yes Has patient had a PCN reaction causing severe rash involving mucus membranes or skin necrosis: Yes Has patient had a PCN reaction that required hospitalization: No Has patient had a PCN reaction occurring within the last 10 years: yes If all of the above answers are "NO", then may proceed with Cephalosporin use.      Thank you for allowing pharmacy to be a part of this patient's care.  Excell Seltzer Poteet 10/06/2016 9:10 AM

## 2016-10-06 NOTE — Progress Notes (Signed)
Initial Nutrition Assessment    INTERVENTION:  Ensure Enlive po BID, each supplement provides 350 kcal and 20 grams of protein   MVI daily   NUTRITION DIAGNOSIS:   Increased nutrient needs related to cancer and cancer related treatments as evidenced by estimated needs.  GOAL:   Patient will meet greater than or equal to 90% of their needs   MONITOR:   PO intake, Supplement acceptance, Labs, Weight trends  REASON FOR ASSESSMENT:   Malnutrition Screening Tool    ASSESSMENT:  [name] is a pleasant 83 yo who has pancreatic cancer with liver metastasis. She is receiving chemotherapy. Her hx also includes anemia and chronic diarrhea.   The patients home diet is regular and she reports diminished appetite compared to where she was a few months ago. Her intake today 50% of breakfast and she tells she drank 100% of her Ensure Enlive.  She has been seen by the [place] RD last year around this time for wt loss and taste changes.    Her weight has decreased further since February by 7% which is trending toward significant.  Nutrition-Focused physical exam completed. Findings are mild fat depletion, mild muscle depletion, and mild edema left foot.     Recent Labs Lab 10/02/16 1002 10/05/16 1951 10/06/16 0617  NA 138 138 137  K 3.8 3.7 3.9  CL 101 103 103  CO2 29 27 26   BUN 14 22* 19  CREATININE 1.07* 1.07* 0.91  CALCIUM 8.6* 8.5* 8.5*  GLUCOSE 135* 128* 146*   Labs and Meds reviewed.   Diet Order:  Diet regular Room service appropriate? Yes; Fluid consistency: Thin  Skin:  Reviewed, no issues  Last BM:  5/26 -medium  Height:   Ht Readings from Last 1 Encounters:  10/05/16 5\' 4"  (1.626 m)    Weight:   Wt Readings from Last 1 Encounters:  10/05/16 132 lb 0.9 oz (59.9 kg)    Ideal Body Weight:  55 kg  BMI:  Body mass index is 22.67 kg/m.  Estimated Nutritional Needs:   Kcal:  4784-1282 (30-33 kcal/kg)  Protein:  72-90 gr (1.2-1.5  gr/kg)  Fluid:  >1500 ml daily  EDUCATION NEEDS:   No education needs identified at this time (Pt seen by [place] )  Colman Cater [name],RD,CSG,LDN Office: [phone] Pager: [phone]

## 2016-10-06 NOTE — Care Management Note (Signed)
Case Management Note  Patient Details  Name: Kilynn Fitzsimmons MRN: [id number] Date of Birth: Dec 08, 1928  Subjective/Objective: Adm with ?cellulitis/?gout. From home alone, ind PTA. Has family and friend support. Has PCP, still drives to appointments, reports no issues affording medications.                   Action/Plan: Plans to return home with self care.    Expected Discharge Date:       10/07/2016           Expected Discharge Plan:  Home/Self Care  In-House Referral:     Discharge planning Services  CM Consult  Post Acute Care Choice:    Choice offered to:     DME Arranged:    DME Agency:     HH Arranged:    HH Agency:     Status of Service:  In process, will continue to follow  If discussed at Long Length of Stay Meetings, dates discussed:    Additional Comments:  Sumaiyah Markert, Chauncey Reading, RN 10/06/2016, 12:16 PM

## 2016-10-06 NOTE — Care Management Obs Status (Signed)
[facility] NOTIFICATION   Patient Details  Name: [name] MRN: [id number] Date of Birth: [birth year]   Medicare Observation Status Notification Given:  Yes    Raymie Giammarco, Chauncey Reading, RN 10/06/2016, 11:19 AM

## 2016-10-07 DIAGNOSIS — L03116 Cellulitis of left lower limb: Secondary | ICD-10-CM | POA: Diagnosis not present

## 2016-10-07 DIAGNOSIS — C259 Malignant neoplasm of pancreas, unspecified: Secondary | ICD-10-CM | POA: Diagnosis not present

## 2016-10-07 DIAGNOSIS — T451X5A Adverse effect of antineoplastic and immunosuppressive drugs, initial encounter: Secondary | ICD-10-CM | POA: Diagnosis not present

## 2016-10-07 DIAGNOSIS — K529 Noninfective gastroenteritis and colitis, unspecified: Secondary | ICD-10-CM | POA: Diagnosis not present

## 2016-10-07 DIAGNOSIS — D6481 Anemia due to antineoplastic chemotherapy: Secondary | ICD-10-CM | POA: Diagnosis not present

## 2016-10-07 DIAGNOSIS — I10 Essential (primary) hypertension: Secondary | ICD-10-CM | POA: Diagnosis not present

## 2016-10-07 DIAGNOSIS — C787 Secondary malignant neoplasm of liver and intrahepatic bile duct: Secondary | ICD-10-CM | POA: Diagnosis not present

## 2016-10-07 LAB — BASIC METABOLIC PANEL
Anion gap: 6 (ref 5–15)
BUN: 16 mg/dL (ref 6–20)
CHLORIDE: 107 mmol/L (ref 101–111)
CO2: 27 mmol/L (ref 22–32)
CREATININE: 0.81 mg/dL (ref 0.44–1.00)
Calcium: 8.3 mg/dL — ABNORMAL LOW (ref 8.9–10.3)
GFR calc Af Amer: >60 mL/min (ref >60)
GFR calc non Af Amer: >60 mL/min (ref >60)
GLUCOSE: 116 mg/dL — AB (ref 65–99)
POTASSIUM: 3.4 mmol/L — AB (ref 3.5–5.1)
SODIUM: 140 mmol/L (ref 135–145)

## 2016-10-07 MED ORDER — SULFAMETHOXAZOLE-TRIMETHOPRIM 400-80 MG PO TABS
1.0000 | ORAL_TABLET | Freq: Two times a day (BID) | ORAL | Status: DC
  Filled 2016-10-07 (×3): qty 1

## 2016-10-07 MED ORDER — SULFAMETHOXAZOLE-TRIMETHOPRIM 400-80 MG PO TABS: 1.0000 | ORAL_TABLET | Freq: Two times a day (BID) | ORAL | 0 refills | Status: DC

## 2016-10-07 MED ORDER — SULFAMETHOXAZOLE-TRIMETHOPRIM 800-160 MG PO TABS: 0.5000 | ORAL_TABLET | Freq: Two times a day (BID) | ORAL | Status: DC

## 2016-10-07 NOTE — Discharge Summary (Signed)
Physician Discharge Summary  [name] QVZ:[id number] DOB: 1928/06/18 DOA: 10/05/2016  PCP: Fayrene Helper, MD  Admit date: 10/05/2016 Discharge date: 10/07/2016  Admitted From: Home  Disposition:  Home   Recommendations for Outpatient Follow-up:  1. Follow up with PCP in 1 week. 2. Patient has been placed on Bactrim for the next 7 days.   Home Health:*Equipment/Devices: No  Discharge Condition: Stable CODE STATUS: Full  Diet recommendation: Regular  Brief/Interim Summary: 83 yo female presents with the chief complain of acute edema on the left ankle. Patient known to have pancreatic cancer with liver mets, on chemotherapy, HTN and depression. On the day of admission, developed acute pain on the left ankle, associated with edema, erythema and subjective fevers. On the initial physical examination, blood pressure 117/75, HR 89, RR18. Mucous membranes were moist, lungs clear to auscultation, heart with rhythmic S1 and S2, no gallops or rubs, abdomen soft and non tender, left ankle with erythema and edema at the medial maleolus. Sodium 138, potassium 3.7, chloride 103, bicarbonate 27, glucose 120, BUN 22, creatinine 1.07, white count 7.4, hemoglobin 10.0, hematocrit 29.5, platelets 64. Left ankle x-ray showing no acute osseous findings, nonspecific soft tissue edema.   Patient admitted with the working diagnosis of cellulitis.   1. Left lower extremity cellulitis. Patient was admitted to medical floor, she was placed in hepatic therapy with IV vancomycin, with significant improvement of her rash, patient will be discharged on Bactrim for the next 7 days. No signs of left ankle joint involvement. Discharge white count 5.5. Patient remained afebrile.  2. Hypertension. Blood pressure remained well-controlled, patient received IV fluids. Continue amlodipine and metoprolol.  3. Chronic anemia. Stable hemoglobin and hematocrit.  4. Pancreatic cancer on chemotherapy. Will follow-up as an  outpatient, no nausea or vomiting. Patient tolerated it well IV antibiotic therapy, suspected secondary immunosuppression due to chemotherapy.   5. Chronic diarrhea. Continue loperamide as per home regimen.   Discharge Diagnoses:  Principal Problem:   Acute gout Active Problems:   Pancreatic cancer metastasized to liver [facility])   DNR (do not resuscitate)   Anemia due to antineoplastic chemotherapy    Discharge Instructions   Allergies as of 10/07/2016      Reactions   Iohexol Hives, Shortness Of Breath   PATIENT HAD TO BE TAKEN TO ED, C/O WHELPS, HIVES, DIFFICULTY BREATHING   Aciphex [rabeprazole Sodium] Other (See Comments)   unknown   Amlodipine Besylate-valsartan    Rash to high-dose (5/320)   Esomeprazole Magnesium    Omeprazole Other (See Comments)   Patient states "medication didn't work"   Ciprofloxacin Rash   Penicillins Swelling, Rash   Has patient had a PCN reaction causing immediate rash, facial/tongue/throat swelling, SOB or lightheadedness with hypotension: Yes Has patient had a PCN reaction causing severe rash involving mucus membranes or skin necrosis: Yes Has patient had a PCN reaction that required hospitalization: No Has patient had a PCN reaction occurring within the last 10 years: yes If all of the above answers are "NO", then may proceed with Cephalosporin use.      Medication List    TAKE these medications   acetaminophen 500 MG tablet Commonly known as:  TYLENOL Take 500 mg by mouth every 6 (six) hours as needed for mild pain or moderate pain.   amLODipine 10 MG tablet Commonly known as:  NORVASC Take 1 tablet (10 mg total) by mouth daily.   Calcium-Phosphorus-Vitamin D [phone] MG-MG-UNIT Chew Chew 2 tablets by mouth daily.  clonazePAM 0.5 MG tablet Commonly known as:  KLONOPIN Take 1 tablet (0.5 mg total) by mouth 2 (two) times daily as needed. for anxiety   cycloSPORINE 0.05 % ophthalmic emulsion Commonly known as:  RESTASIS Place 1  drop into both eyes 2 (two) times daily.   docusate sodium 100 MG capsule Commonly known as:  COLACE Take 100 mg by mouth daily as needed for mild constipation.   EQ ONE DAILY WOMENS 50+ Tabs Take 1 tablet by mouth daily.   ferrous sulfate 325 (65 FE) MG tablet Take 325 mg by mouth daily with breakfast.   lidocaine-prilocaine cream Commonly known as:  EMLA APPLY A QUARTER SIZE AMOUNT TO PORT SITE 1 HOURS PRIOR TO CHEMO DO NOT RUB IN COVER WITH PLASTIC WRAP   metoprolol tartrate 25 MG tablet Commonly known as:  LOPRESSOR Take 0.5 tablets (12.5 mg total) by mouth 2 (two) times daily.   ondansetron 8 MG tablet Commonly known as:  ZOFRAN Take 1 tablet (8 mg total) by mouth 2 (two) times daily as needed (Nausea or vomiting).   pantoprazole 20 MG tablet Commonly known as:  PROTONIX TAKE 1 TABLET(20 MG) BY MOUTH DAILY   Potassium 99 MG Tabs Take 1 tablet by mouth daily.   prochlorperazine 10 MG tablet Commonly known as:  COMPAZINE Take 1 tablet (10 mg total) by mouth every 6 (six) hours as needed (Nausea or vomiting).   sulfamethoxazole-trimethoprim 400-80 MG tablet Commonly known as:  BACTRIM Take 1 tablet by mouth 2 (two) times daily.   traMADol 50 MG tablet Commonly known as:  ULTRAM One tablet twice daily as needed for pain What changed:  how much to take  how to take this  when to take this  reasons to take this  additional instructions       Allergies  Allergen Reactions  . Iohexol Hives and Shortness Of Breath    PATIENT HAD TO BE TAKEN TO ED, C/O WHELPS, HIVES, DIFFICULTY BREATHING  . Aciphex [Rabeprazole Sodium] Other (See Comments)    unknown  . Amlodipine Besylate-Valsartan     Rash to high-dose (5/320)  . Esomeprazole Magnesium   . Omeprazole Other (See Comments)    Patient states "medication didn't work"  . Ciprofloxacin Rash  . Penicillins Swelling and Rash    Has patient had a PCN reaction causing immediate rash, facial/tongue/throat  swelling, SOB or lightheadedness with hypotension: Yes Has patient had a PCN reaction causing severe rash involving mucus membranes or skin necrosis: Yes Has patient had a PCN reaction that required hospitalization: No Has patient had a PCN reaction occurring within the last 10 years: yes If all of the above answers are "NO", then may proceed with Cephalosporin use.     Consultations:   Procedures/Studies: Dg Ankle Complete Left  Result Date: 10/05/2016 CLINICAL DATA:  Left lower leg and foot swelling and erythema for 2 weeks. No reported acute injury. EXAM: LEFT ANKLE COMPLETE - 3+ VIEW COMPARISON:  None. FINDINGS: The bones appear mildly demineralized. No evidence of acute fracture, dislocation or bone destruction. The subtalar joint is ill-defined on the lateral view, but there is no associated talar beaking to confirm subtalar coalition. There is diffuse soft tissue prominence around the ankle. Scattered vascular calcifications are noted. IMPRESSION: Nonspecific soft tissue prominence around the ankle, possibly swelling. No acute osseous findings. Electronically Signed   By: Richardean Sale M.D.   On: 10/05/2016 20:26   Mr Abdomen W Wo Contrast  Result Date: 09/11/2016 CLINICAL DATA:  Pancreatic cancer EXAM: MRI ABDOMEN WITHOUT AND WITH CONTRAST TECHNIQUE: Multiplanar multisequence MR imaging of the abdomen was performed both before and after the administration of intravenous contrast. CONTRAST:  32mL MULTIHANCE GADOBENATE DIMEGLUMINE 529 MG/ML IV SOLN COMPARISON:  05/23/2016 FINDINGS: Lower chest:  Unremarkable. Hepatobiliary: Evaluation of the liver is markedly degraded by motion artifact in this patient who was unable to reproducibly breath hold. There is differential perfusion in the posterior right liver which is heterogeneous. There may be some foci of decreased enhancement within this region. Intra and extrahepatic biliary duct dilatation persists. Pancreas: Pancreatic tail lesion appears  progressed measuring 2.6 x 1.7 cm today compared to 2.0 x 1.5 cm previously. Spleen: Stable splenic cyst. Adrenals/Urinary Tract: No adrenal nodule or mass. Innumerable bilateral renal cysts are again identified. 2.4 cm cyst medial interpolar right kidney is not substantially changed. Stomach/Bowel: Stomach decompressed. No small bowel or colon dilatation in the visualized abdomen. Vascular/Lymphatic: No abdominal aortic aneurysm. No abdominal lymphadenopathy. Other: No free fluid Musculoskeletal: No abnormal marrow enhancement within the visualized bony anatomy. IMPRESSION: 1. Markedly degraded study due to patient difficulty with reproducible breath holding. Consider CT imaging with its better temporal resolution for follow-up. 2. Interval progression of pancreatic tail lesion. 3. New differential perfusion right liver with areas of heterogeneity. Underlying metastatic disease in this region not excluded. Attention on follow-up recommended. 4. Innumerable bilateral renal cysts with stable appearance of the Bosniak II F lesion right kidney. 5. Marked intra and extrahepatic biliary duct dilatation as before. 6. Stable cystic splenic lesion as before. Electronically Signed   By: Misty Stanley M.D.   On: 09/11/2016 13:02   Mr 3d Recon At Scanner  Result Date: 09/11/2016 CLINICAL DATA:  Pancreatic cancer EXAM: MRI ABDOMEN WITHOUT AND WITH CONTRAST TECHNIQUE: Multiplanar multisequence MR imaging of the abdomen was performed both before and after the administration of intravenous contrast. CONTRAST:  51mL MULTIHANCE GADOBENATE DIMEGLUMINE 529 MG/ML IV SOLN COMPARISON:  05/23/2016 FINDINGS: Lower chest:  Unremarkable. Hepatobiliary: Evaluation of the liver is markedly degraded by motion artifact in this patient who was unable to reproducibly breath hold. There is differential perfusion in the posterior right liver which is heterogeneous. There may be some foci of decreased enhancement within this region. Intra and  extrahepatic biliary duct dilatation persists. Pancreas: Pancreatic tail lesion appears progressed measuring 2.6 x 1.7 cm today compared to 2.0 x 1.5 cm previously. Spleen: Stable splenic cyst. Adrenals/Urinary Tract: No adrenal nodule or mass. Innumerable bilateral renal cysts are again identified. 2.4 cm cyst medial interpolar right kidney is not substantially changed. Stomach/Bowel: Stomach decompressed. No small bowel or colon dilatation in the visualized abdomen. Vascular/Lymphatic: No abdominal aortic aneurysm. No abdominal lymphadenopathy. Other: No free fluid Musculoskeletal: No abnormal marrow enhancement within the visualized bony anatomy. IMPRESSION: 1. Markedly degraded study due to patient difficulty with reproducible breath holding. Consider CT imaging with its better temporal resolution for follow-up. 2. Interval progression of pancreatic tail lesion. 3. New differential perfusion right liver with areas of heterogeneity. Underlying metastatic disease in this region not excluded. Attention on follow-up recommended. 4. Innumerable bilateral renal cysts with stable appearance of the Bosniak II F lesion right kidney. 5. Marked intra and extrahepatic biliary duct dilatation as before. 6. Stable cystic splenic lesion as before. Electronically Signed   By: Misty Stanley M.D.   On: 09/11/2016 13:02       Subjective: Patient feeling better, left lower extremity erythema and edema have improved, pain improved, patient has been ambulating. No nausea  or vomiting. No dyspnea or chest pain.   Discharge Exam: Vitals:   10/06/16 2200 10/07/16 0645  BP: (!) 134/43 (!) 142/45  Pulse: 71 63  Resp: 18 18  Temp: 98.5 F (36.9 C) 98.2 F (36.8 C)   Vitals:   10/06/16 0949 10/06/16 1557 10/06/16 2200 10/07/16 0645  BP:  (!) 148/60 (!) 134/43 (!) 142/45  Pulse: 67 71 71 63  Resp:  18 18 18   Temp:  98.3 F (36.8 C) 98.5 F (36.9 C) 98.2 F (36.8 C)  TempSrc:  Oral Oral Oral  SpO2:  100% 100% 100%   Weight:      Height:        General: Pt is alert, awake, not in acute distress E ENT. Mild pallor, no icterus, oral mucosa moist.  Cardiovascular: RRR, S1/S2 +, no rubs, no gallops Respiratory: CTA bilaterally, no wheezing, no rhonchi Abdominal: Soft, NT, ND, bowel sounds + Extremities: left ankle with significant improvement of the erythema and edema,    The results of significant diagnostics from this hospitalization (including imaging, microbiology, ancillary and laboratory) are listed below for reference.     Microbiology: No results found for this or any previous visit (from the past 240 hour(s)).   Labs: BNP (last 3 results) No results for input(s): BNP in the last 8760 hours. Basic Metabolic Panel:  Recent Labs Lab 10/02/16 1002 10/05/16 1951 10/06/16 0617 10/07/16 0601  NA 138 138 137 140  K 3.8 3.7 3.9 3.4*  CL 101 103 103 107  CO2 29 27 26 27   GLUCOSE 135* 128* 146* 116*  BUN 14 22* 19 16  CREATININE 1.07* 1.07* 0.91 0.81  CALCIUM 8.6* 8.5* 8.5* 8.3*   Liver Function Tests:  Recent Labs Lab 10/02/16 1002  AST 22  ALT 16  ALKPHOS 122  BILITOT 0.5  PROT 6.7  ALBUMIN 3.1*   No results for input(s): LIPASE, AMYLASE in the last 168 hours. No results for input(s): AMMONIA in the last 168 hours. CBC:  Recent Labs Lab 10/02/16 1002 10/05/16 1951 10/06/16 0617  WBC 3.3* 7.4 5.5  NEUTROABS 1.9 6.9 4.9  HGB 11.0* 10.0* 9.9*  HCT 32.4* 29.5* 29.9*  MCV 84.2 83.3 83.5  PLT 86* 64* 62*   Cardiac Enzymes: No results for input(s): CKTOTAL, CKMB, CKMBINDEX, TROPONINI in the last 168 hours. BNP: Invalid input(s): POCBNP CBG: No results for input(s): GLUCAP in the last 168 hours. D-Dimer No results for input(s): DDIMER in the last 72 hours. Hgb A1c No results for input(s): HGBA1C in the last 72 hours. Lipid Profile No results for input(s): CHOL, HDL, LDLCALC, TRIG, CHOLHDL, LDLDIRECT in the last 72 hours. Thyroid function studies  Recent Labs   10/06/16 0617  TSH 0.721   Anemia work up No results for input(s): VITAMINB12, FOLATE, FERRITIN, TIBC, IRON, RETICCTPCT in the last 72 hours. Urinalysis    Component Value Date/Time   COLORURINE YELLOW 09/16/2016 0952   APPEARANCEUR CLEAR 09/16/2016 0952   LABSPEC 1.016 09/16/2016 0952   PHURINE 5.0 09/16/2016 0952   GLUCOSEU NEGATIVE 09/16/2016 0952   HGBUR MODERATE (A) 09/16/2016 0952   BILIRUBINUR NEGATIVE 09/16/2016 0952   BILIRUBINUR small 05/01/2016 1425   KETONESUR NEGATIVE 09/16/2016 0952   PROTEINUR >=300 (A) 09/16/2016 0952   UROBILINOGEN 0.2 05/01/2016 1425   UROBILINOGEN 0.2 10/04/2014 0920   NITRITE NEGATIVE 09/16/2016 0952   LEUKOCYTESUR NEGATIVE 09/16/2016 0952   Sepsis Labs Invalid input(s): PROCALCITONIN,  WBC,  LACTICIDVEN Microbiology No results found for this  or any previous visit (from the past 240 hour(s)).   Time coordinating discharge: 45 minutes  SIGNED:   Tawni Millers, MD  [facility] 10/07/2016, 9:44 AM Pager   If 7PM-7AM, please contact night-coverage [email or web address] Password [email or web address]

## 2016-10-07 NOTE — Care Management Note (Signed)
Case Management Note  Patient Details  Name: [name] MRN: [id number] Date of Birth: 12/29/1928   Expected Discharge Date:  10/07/16               Expected Discharge Plan:  Home/Self Care  In-House Referral:  NA  Discharge planning Services  CM Consult  Post Acute Care Choice:  NA Choice offered to:  NA  Status of Service:  Completed, signed off  Additional Comments: DC today. No CM needs communicated.   Sherald Barge, RN 10/07/2016, 11:17 AM

## 2016-10-09 ENCOUNTER — Telehealth: Payer: Self-pay

## 2016-10-09 NOTE — Telephone Encounter (Signed)
Transition Care Management Follow-up Telephone Call   Date discharged? 10/07/2016   How have you been since you were released from the hospital? Much better, swelling has improved significantly and is almost gone, and the redness is completely gone.   Do you understand why you were in the hospital? yes   Do you understand the discharge instructions? yes   Where were you discharged to? Home   Items Reviewed:  Medications reviewed: yes  Allergies reviewed: yes  Dietary changes reviewed: yes  Referrals reviewed: yes   Functional Questionnaire:   Activities of Daily Living (ADLs):   She states they are independent in the following: ambulation, bathing and hygiene, feeding, continence, grooming, toileting and dressing States they require assistance with the following: None   Any transportation issues/concerns?: no   Any patient concerns? no   Confirmed importance and date/time of follow-up visits scheduled yes  Provider Appointment booked with Dr. Moshe Cipro on 10/13/2016 at 1:00 pm.  Confirmed with patient if condition begins to worsen call PCP or go to the ER.  Patient was given the office number and encouraged to call back with question or concerns.  : yes

## 2016-10-13 ENCOUNTER — Encounter: Payer: Self-pay | Admitting: Family Medicine

## 2016-10-13 ENCOUNTER — Ambulatory Visit (INDEPENDENT_AMBULATORY_CARE_PROVIDER_SITE_OTHER): Payer: Medicare HMO | Admitting: Family Medicine

## 2016-10-13 VITALS — BP 140/70 | HR 77 | Resp 16 | Ht 64.0 in | Wt 130.0 lb

## 2016-10-13 DIAGNOSIS — C259 Malignant neoplasm of pancreas, unspecified: Secondary | ICD-10-CM

## 2016-10-13 DIAGNOSIS — C787 Secondary malignant neoplasm of liver and intrahepatic bile duct: Secondary | ICD-10-CM

## 2016-10-13 DIAGNOSIS — R69 Illness, unspecified: Secondary | ICD-10-CM | POA: Diagnosis not present

## 2016-10-13 DIAGNOSIS — Z09 Encounter for follow-up examination after completed treatment for conditions other than malignant neoplasm: Secondary | ICD-10-CM | POA: Diagnosis not present

## 2016-10-13 DIAGNOSIS — I10 Essential (primary) hypertension: Secondary | ICD-10-CM

## 2016-10-13 DIAGNOSIS — F419 Anxiety disorder, unspecified: Secondary | ICD-10-CM | POA: Diagnosis not present

## 2016-10-13 NOTE — Patient Instructions (Signed)
F/u as before  CBC  And chem 7 today   All the  very best with your treatments

## 2016-10-14 DIAGNOSIS — Z09 Encounter for follow-up examination after completed treatment for conditions other than malignant neoplasm: Secondary | ICD-10-CM | POA: Insufficient documentation

## 2016-10-14 LAB — CBC
HCT: 30.9 % — ABNORMAL LOW (ref 35.0–45.0)
Hemoglobin: 10 g/dL — ABNORMAL LOW (ref 11.7–15.5)
MCH: 28.2 pg (ref 27.0–33.0)
MCHC: 32.4 g/dL (ref 32.0–36.0)
MCV: 87 fL (ref 80.0–100.0)
MPV: 10 fL (ref 7.5–12.5)
Platelets: 129 10*3/uL — ABNORMAL LOW (ref 140–400)
RBC: 3.55 MIL/uL — ABNORMAL LOW (ref 3.80–5.10)
RDW: 15.8 % — AB (ref 11.0–15.0)
WBC: 3.2 10*3/uL — ABNORMAL LOW (ref 3.8–10.8)

## 2016-10-14 LAB — BASIC METABOLIC PANEL WITH GFR
BUN: 18 mg/dL (ref 7–25)
CO2: 24 mmol/L (ref 20–31)
CREATININE: 1.47 mg/dL — AB (ref 0.60–0.88)
Calcium: 8.4 mg/dL — ABNORMAL LOW (ref 8.6–10.4)
Chloride: 107 mmol/L (ref 98–110)
GFR, EST AFRICAN AMERICAN: 37 mL/min — AB (ref >=60)
GFR, Est Non African American: 32 mL/min — ABNORMAL LOW (ref >=60)
Glucose, Bld: 121 mg/dL — ABNORMAL HIGH (ref 65–99)
Potassium: 3.9 mmol/L (ref 3.5–5.3)
SODIUM: 142 mmol/L (ref 135–146)

## 2016-10-14 NOTE — Assessment & Plan Note (Signed)
Controlled, no change in medication  

## 2016-10-14 NOTE — Assessment & Plan Note (Signed)
Adequately corrected , no med change 

## 2016-10-14 NOTE — Assessment & Plan Note (Signed)
Cellulitis left ankle and LLE improved, pt on last day of septra, will re check CBC and chem 7

## 2016-10-14 NOTE — Assessment & Plan Note (Signed)
Currently being treated for recurrent disease and reports tolerating treatment well

## 2016-10-14 NOTE — Progress Notes (Signed)
   [name]     MRN: [id number]      DOB: 17-Nov-1928   HPI [name] is here for follow up of hospitalization from 5/27 to 10/07/2016 for cellulitis of left ankle . Reports marked improvement in redness and swelling and pain of left ankle and leg, denies fever , chills or skin breakdown No trauma to leg Just completing outpatient course of septra C/o recurrent metastatic pancreatic cancer which she recently started treatment wants to review tumor numbers and  Tumor size but I absolutely discouraged this at the visit Appetite is fair , and she denies pain ROS Denies recent fever or chills. Denies sinus pressure, nasal congestion, ear pain or sore throat. Denies chest congestion, productive cough or wheezing. Denies chest pains, palpitations and leg swelling Denies abdominal pain, nausea, vomiting,diarrhea or constipation.   Denies dysuria, frequency, hesitancy or incontinence.  Denies headaches, seizures, numbness, or tingling. Denies depression, uncontrolled  anxiety or insomnia.  PE  BP (!) 160/70   Pulse 77   Resp 16   Ht 5\' 4"  (1.626 m)   Wt 130 lb (59 kg)   SpO2 96%   BMI 22.31 kg/m  Rechecked BP 140/70 Patient alert and oriented and in no cardiopulmonary distress.  HEENT: No facial asymmetry, EOMI,   oropharynx pink and moist.  Neck supple no JVD, no mass.  Chest: Clear to auscultation bilaterally.  CVS: S1, S2 no murmurs, no S3.Regular rate.  ABD: Soft non tender.   Ext:LLE edema and erythema no warmth, one plus edema to lower one third of the left  leg  MS: Adequate ROM spine, shoulders, hips and knees.Decreased ROM left ankle with deformity and swelling Skin: Intact, no ulcerations or rash noted.  Psych: Good eye contact, normal affect. Memory intact not anxious or depressed appearing.  CNS: CN 2-12 intact, power,  normal throughout.no focal deficits noted.   [facility] discharge follow-up Cellulitis left ankle and LLE improved, pt  on last day of septra, will re check CBC and chem 7  Essential hypertension Adequately corrected, no med change  Anxiety Controlled, no change in medication   Pancreatic cancer metastasized to liver [facility]) Currently being treated for recurrent disease and reports tolerating treatment well

## 2016-10-16 ENCOUNTER — Telehealth (HOSPITAL_COMMUNITY): Payer: Self-pay

## 2016-10-16 ENCOUNTER — Telehealth: Payer: Self-pay | Admitting: Family Medicine

## 2016-10-16 ENCOUNTER — Other Ambulatory Visit: Payer: Self-pay | Admitting: Family Medicine

## 2016-10-16 NOTE — Telephone Encounter (Signed)
Patient called stating her feet and legs were swollen more than usual yesterday. Recently she was in the hospital for the swelling in her feet because the MD thought it could be gout, however she states it wasn't gout, it was an "infection". She was sent home on antibiotics and states she did finish them. She did not realize her feet and legs were swollen yesterday until her sister pointed it out. She denies pain, fever and warmth in her legs. Reviewed with provider, explained that she needs to wear loose fitting shoes and socks, she states she already does. Also when she is sitting down ,she needs to prop her feet up. She states she will start doing that. If the swelling continues to be bad, she needs to call PCP. Patient verbalized understanding.

## 2016-10-16 NOTE — Telephone Encounter (Signed)
Patient left voicemail, Both feet still swollen as well as rt knee and ankle, cancer ctr told her to keep her feet elevated and call dr.simpson if she doesn't get any better (that was yesterday)  Cb#: [phone]

## 2016-10-17 NOTE — Telephone Encounter (Signed)
I advised pt to wear compression hose and elevate her legs, will not prescribe diuretic, her renal function is slightly compromised currently I think triggered by recent septra

## 2016-10-17 NOTE — Telephone Encounter (Signed)
Was seen recently. Any recommendations or additional eval/referral needed?

## 2016-10-20 ENCOUNTER — Other Ambulatory Visit: Payer: Self-pay | Admitting: Family Medicine

## 2016-10-22 ENCOUNTER — Encounter (HOSPITAL_COMMUNITY): Payer: Self-pay

## 2016-10-22 ENCOUNTER — Encounter (HOSPITAL_COMMUNITY): Payer: Medicare HMO | Attending: Oncology

## 2016-10-22 VITALS — BP 174/56 | HR 50 | Temp 97.7°F | Resp 18 | Wt 129.4 lb

## 2016-10-22 DIAGNOSIS — F419 Anxiety disorder, unspecified: Secondary | ICD-10-CM | POA: Insufficient documentation

## 2016-10-22 DIAGNOSIS — R69 Illness, unspecified: Secondary | ICD-10-CM | POA: Diagnosis not present

## 2016-10-22 DIAGNOSIS — Z5111 Encounter for antineoplastic chemotherapy: Secondary | ICD-10-CM | POA: Diagnosis not present

## 2016-10-22 DIAGNOSIS — C787 Secondary malignant neoplasm of liver and intrahepatic bile duct: Secondary | ICD-10-CM

## 2016-10-22 DIAGNOSIS — Z8249 Family history of ischemic heart disease and other diseases of the circulatory system: Secondary | ICD-10-CM | POA: Diagnosis not present

## 2016-10-22 DIAGNOSIS — Z8719 Personal history of other diseases of the digestive system: Secondary | ICD-10-CM | POA: Diagnosis not present

## 2016-10-22 DIAGNOSIS — Z808 Family history of malignant neoplasm of other organs or systems: Secondary | ICD-10-CM | POA: Diagnosis not present

## 2016-10-22 DIAGNOSIS — Z841 Family history of disorders of kidney and ureter: Secondary | ICD-10-CM | POA: Diagnosis not present

## 2016-10-22 DIAGNOSIS — M549 Dorsalgia, unspecified: Secondary | ICD-10-CM | POA: Diagnosis not present

## 2016-10-22 DIAGNOSIS — Z801 Family history of malignant neoplasm of trachea, bronchus and lung: Secondary | ICD-10-CM | POA: Insufficient documentation

## 2016-10-22 DIAGNOSIS — F329 Major depressive disorder, single episode, unspecified: Secondary | ICD-10-CM | POA: Insufficient documentation

## 2016-10-22 DIAGNOSIS — K219 Gastro-esophageal reflux disease without esophagitis: Secondary | ICD-10-CM | POA: Diagnosis not present

## 2016-10-22 DIAGNOSIS — Z87891 Personal history of nicotine dependence: Secondary | ICD-10-CM | POA: Diagnosis not present

## 2016-10-22 DIAGNOSIS — Z87442 Personal history of urinary calculi: Secondary | ICD-10-CM | POA: Insufficient documentation

## 2016-10-22 DIAGNOSIS — Z9049 Acquired absence of other specified parts of digestive tract: Secondary | ICD-10-CM | POA: Insufficient documentation

## 2016-10-22 DIAGNOSIS — C252 Malignant neoplasm of tail of pancreas: Secondary | ICD-10-CM | POA: Diagnosis not present

## 2016-10-22 DIAGNOSIS — Z9889 Other specified postprocedural states: Secondary | ICD-10-CM | POA: Insufficient documentation

## 2016-10-22 DIAGNOSIS — Z66 Do not resuscitate: Secondary | ICD-10-CM | POA: Insufficient documentation

## 2016-10-22 DIAGNOSIS — C259 Malignant neoplasm of pancreas, unspecified: Secondary | ICD-10-CM | POA: Insufficient documentation

## 2016-10-22 DIAGNOSIS — Z9221 Personal history of antineoplastic chemotherapy: Secondary | ICD-10-CM | POA: Insufficient documentation

## 2016-10-22 DIAGNOSIS — I10 Essential (primary) hypertension: Secondary | ICD-10-CM | POA: Diagnosis not present

## 2016-10-22 DIAGNOSIS — R350 Frequency of micturition: Secondary | ICD-10-CM | POA: Diagnosis not present

## 2016-10-22 DIAGNOSIS — G8929 Other chronic pain: Secondary | ICD-10-CM | POA: Diagnosis not present

## 2016-10-22 DIAGNOSIS — Z833 Family history of diabetes mellitus: Secondary | ICD-10-CM | POA: Diagnosis not present

## 2016-10-22 DIAGNOSIS — E876 Hypokalemia: Secondary | ICD-10-CM | POA: Diagnosis not present

## 2016-10-22 LAB — COMPREHENSIVE METABOLIC PANEL
ALBUMIN: 3.5 g/dL (ref 3.5–5.0)
ALT: 14 U/L (ref 14–54)
ANION GAP: 7 (ref 5–15)
AST: 21 U/L (ref 15–41)
Alkaline Phosphatase: 122 U/L (ref 38–126)
BUN: 20 mg/dL (ref 6–20)
CHLORIDE: 102 mmol/L (ref 101–111)
CO2: 28 mmol/L (ref 22–32)
Calcium: 8.8 mg/dL — ABNORMAL LOW (ref 8.9–10.3)
Creatinine, Ser: 1.02 mg/dL — ABNORMAL HIGH (ref 0.44–1.00)
GFR calc Af Amer: 56 mL/min — ABNORMAL LOW (ref >60)
GFR calc non Af Amer: 49 mL/min — ABNORMAL LOW (ref >60)
GLUCOSE: 108 mg/dL — AB (ref 65–99)
POTASSIUM: 3.7 mmol/L (ref 3.5–5.1)
SODIUM: 137 mmol/L (ref 135–145)
TOTAL PROTEIN: 6.8 g/dL (ref 6.5–8.1)
Total Bilirubin: 0.6 mg/dL (ref 0.3–1.2)

## 2016-10-22 LAB — CBC WITH DIFFERENTIAL/PLATELET
Basophils Absolute: 0 10*3/uL (ref 0.0–0.1)
Basophils Relative: 1 %
Eosinophils Absolute: 0.1 10*3/uL (ref 0.0–0.7)
Eosinophils Relative: 3 %
HEMATOCRIT: 33.9 % — AB (ref 36.0–46.0)
Hemoglobin: 11.1 g/dL — ABNORMAL LOW (ref 12.0–15.0)
LYMPHS ABS: 1.6 10*3/uL (ref 0.7–4.0)
LYMPHS PCT: 39 %
MCH: 28.5 pg (ref 26.0–34.0)
MCHC: 32.7 g/dL (ref 30.0–36.0)
MCV: 86.9 fL (ref 78.0–100.0)
MONO ABS: 0.6 10*3/uL (ref 0.1–1.0)
Monocytes Relative: 16 %
NEUTROS ABS: 1.7 10*3/uL (ref 1.7–7.7)
Neutrophils Relative %: 43 %
Platelets: 334 10*3/uL (ref 150–400)
RBC: 3.9 MIL/uL (ref 3.87–5.11)
RDW: 16.8 % — AB (ref 11.5–15.5)
WBC: 4.1 10*3/uL (ref 4.0–10.5)

## 2016-10-22 MED ORDER — PROCHLORPERAZINE MALEATE 10 MG PO TABS
10.0000 mg | ORAL_TABLET | Freq: Once | ORAL | Status: AC
  Administered 2016-10-22: 10 mg via ORAL

## 2016-10-22 MED ORDER — PACLITAXEL PROTEIN-BOUND CHEMO INJECTION 100 MG
125.0000 mg/m2 | Freq: Once | INTRAVENOUS | Status: AC
  Administered 2016-10-22: 200 mg via INTRAVENOUS
  Filled 2016-10-22: qty 40

## 2016-10-22 MED ORDER — PROCHLORPERAZINE MALEATE 10 MG PO TABS
ORAL_TABLET | ORAL | Status: AC
  Filled 2016-10-22: qty 1

## 2016-10-22 MED ORDER — SODIUM CHLORIDE 0.9 % IV SOLN
Freq: Once | INTRAVENOUS | Status: AC
  Administered 2016-10-22: 12:00:00 via INTRAVENOUS

## 2016-10-22 MED ORDER — SODIUM CHLORIDE 0.9 % IV SOLN
950.0000 mg/m2 | Freq: Once | INTRAVENOUS | Status: AC
  Administered 2016-10-22: 1596 mg via INTRAVENOUS
  Filled 2016-10-22: qty 26.3

## 2016-10-22 MED ORDER — HEPARIN SOD (PORK) LOCK FLUSH 100 UNIT/ML IV SOLN
500.0000 [IU] | Freq: Once | INTRAVENOUS | Status: AC | PRN
  Administered 2016-10-22: 500 [IU]

## 2016-10-22 MED ORDER — SODIUM CHLORIDE 0.9% FLUSH
10.0000 mL | INTRAVENOUS | Status: DC | PRN
  Administered 2016-10-22: 10 mL
  Filled 2016-10-22: qty 10

## 2016-10-22 NOTE — Progress Notes (Signed)
Labs reviewed with MD, proceed with treatment  Chemotherapy given today per orders. Patient tolerated it well without problems. Vitals stable and discharged home ambulatory with son at her side. Follow up as scheduled.

## 2016-10-22 NOTE — Patient Instructions (Signed)
[facility] Discharge Instructions for Patients Receiving Chemotherapy   Beginning January 23rd 2017 lab work for the Cancer Center will be done in the  Main lab at [place] on 1st floor. If you have a lab appointment with the Cancer Center please come in thru the  Main Entrance and check in at the main information desk   Today you received the following chemotherapy agents   To help prevent nausea and vomiting after your treatment, we encourage you to take your nausea medication     If you develop nausea and vomiting, or diarrhea that is not controlled by your medication, call the clinic.  The clinic phone number is [phone]. Office hours are Monday-Friday 8:30am-5:00pm.  BELOW ARE SYMPTOMS THAT SHOULD BE REPORTED IMMEDIATELY:  *FEVER GREATER THAN 101.0 F  *CHILLS WITH OR WITHOUT FEVER  NAUSEA AND VOMITING THAT IS NOT CONTROLLED WITH YOUR NAUSEA MEDICATION  *UNUSUAL SHORTNESS OF BREATH  *UNUSUAL BRUISING OR BLEEDING  TENDERNESS IN MOUTH AND THROAT WITH OR WITHOUT PRESENCE OF ULCERS  *URINARY PROBLEMS  *BOWEL PROBLEMS  UNUSUAL RASH Items with * indicate a potential emergency and should be followed up as soon as possible. If you have an emergency after office hours please contact your primary care physician or go to the nearest emergency department.  Please call the clinic during office hours if you have any questions or concerns.   You may also contact the Patient Navigator at [phone] should you have any questions or need assistance in obtaining follow up care.      Resources For Cancer Patients and their Caregivers ? American [employer] Society: Can assist with transportation, wigs, general needs, runs Look Good Feel Better.        1-888-227-6333 ? Cancer Care: Provides financial assistance, online support groups, medication/co-pay assistance.  1-800-813-HOPE (4673) ? [facility] Assists [place] cancer  patients and their families through emotional , educational and financial support.  336-427-4357 ? [place] DSS Where to apply for food stamps, Medicaid and utility assistance. 336-342-1394 ? [facility]: Transportation to medical appointments. 336-347-2287 ? Social Security Administration: May apply for disability if have a Stage IV cancer. 336-342-7796 1-800-772-1213 ? [place] Aging, Disability and Transit Services: Assists with nutrition, care and transit needs. 336-349-2343         

## 2016-10-23 LAB — CANCER ANTIGEN 19-9: CA 19 9: 506 U/mL — AB (ref 0–35)

## 2016-10-29 ENCOUNTER — Encounter (HOSPITAL_BASED_OUTPATIENT_CLINIC_OR_DEPARTMENT_OTHER): Payer: Medicare HMO

## 2016-10-29 ENCOUNTER — Other Ambulatory Visit (HOSPITAL_COMMUNITY): Payer: Self-pay | Admitting: Adult Health

## 2016-10-29 ENCOUNTER — Encounter (HOSPITAL_BASED_OUTPATIENT_CLINIC_OR_DEPARTMENT_OTHER): Payer: Medicare HMO | Admitting: Oncology

## 2016-10-29 ENCOUNTER — Encounter (HOSPITAL_COMMUNITY): Payer: Self-pay

## 2016-10-29 VITALS — BP 165/56 | HR 56 | Temp 98.0°F | Resp 20 | Wt 127.4 lb

## 2016-10-29 DIAGNOSIS — C259 Malignant neoplasm of pancreas, unspecified: Secondary | ICD-10-CM

## 2016-10-29 DIAGNOSIS — R630 Anorexia: Secondary | ICD-10-CM

## 2016-10-29 DIAGNOSIS — C252 Malignant neoplasm of tail of pancreas: Secondary | ICD-10-CM | POA: Diagnosis not present

## 2016-10-29 DIAGNOSIS — Z9221 Personal history of antineoplastic chemotherapy: Secondary | ICD-10-CM | POA: Diagnosis not present

## 2016-10-29 DIAGNOSIS — Z5111 Encounter for antineoplastic chemotherapy: Secondary | ICD-10-CM | POA: Diagnosis not present

## 2016-10-29 DIAGNOSIS — M549 Dorsalgia, unspecified: Secondary | ICD-10-CM | POA: Diagnosis not present

## 2016-10-29 DIAGNOSIS — C787 Secondary malignant neoplasm of liver and intrahepatic bile duct: Secondary | ICD-10-CM

## 2016-10-29 DIAGNOSIS — E876 Hypokalemia: Secondary | ICD-10-CM | POA: Diagnosis not present

## 2016-10-29 DIAGNOSIS — R69 Illness, unspecified: Secondary | ICD-10-CM | POA: Diagnosis not present

## 2016-10-29 DIAGNOSIS — G8929 Other chronic pain: Secondary | ICD-10-CM | POA: Diagnosis not present

## 2016-10-29 DIAGNOSIS — R350 Frequency of micturition: Secondary | ICD-10-CM | POA: Diagnosis not present

## 2016-10-29 DIAGNOSIS — Z66 Do not resuscitate: Secondary | ICD-10-CM | POA: Diagnosis not present

## 2016-10-29 LAB — CBC WITH DIFFERENTIAL/PLATELET
BASOS ABS: 0 10*3/uL (ref 0.0–0.1)
BASOS PCT: 1 %
EOS ABS: 0 10*3/uL (ref 0.0–0.7)
EOS PCT: 1 %
HCT: 30.4 % — ABNORMAL LOW (ref 36.0–46.0)
Hemoglobin: 10.2 g/dL — ABNORMAL LOW (ref 12.0–15.0)
Lymphocytes Relative: 38 %
Lymphs Abs: 1.1 10*3/uL (ref 0.7–4.0)
MCH: 28.9 pg (ref 26.0–34.0)
MCHC: 33.6 g/dL (ref 30.0–36.0)
MCV: 86.1 fL (ref 78.0–100.0)
MONO ABS: 0.3 10*3/uL (ref 0.1–1.0)
Monocytes Relative: 11 %
Neutro Abs: 1.4 10*3/uL — ABNORMAL LOW (ref 1.7–7.7)
Neutrophils Relative %: 49 %
PLATELETS: 118 10*3/uL — AB (ref 150–400)
RBC: 3.53 MIL/uL — AB (ref 3.87–5.11)
RDW: 16 % — AB (ref 11.5–15.5)
WBC: 2.9 10*3/uL — AB (ref 4.0–10.5)

## 2016-10-29 LAB — COMPREHENSIVE METABOLIC PANEL
ALBUMIN: 3.3 g/dL — AB (ref 3.5–5.0)
ALT: 17 U/L (ref 14–54)
AST: 22 U/L (ref 15–41)
Alkaline Phosphatase: 109 U/L (ref 38–126)
Anion gap: 9 (ref 5–15)
BUN: 19 mg/dL (ref 6–20)
CO2: 27 mmol/L (ref 22–32)
CREATININE: 1.06 mg/dL — AB (ref 0.44–1.00)
Calcium: 8.7 mg/dL — ABNORMAL LOW (ref 8.9–10.3)
Chloride: 103 mmol/L (ref 101–111)
GFR calc Af Amer: 54 mL/min — ABNORMAL LOW (ref >60)
GFR calc non Af Amer: 47 mL/min — ABNORMAL LOW (ref >60)
GLUCOSE: 139 mg/dL — AB (ref 65–99)
POTASSIUM: 3.7 mmol/L (ref 3.5–5.1)
SODIUM: 139 mmol/L (ref 135–145)
Total Bilirubin: 0.3 mg/dL (ref 0.3–1.2)
Total Protein: 6.8 g/dL (ref 6.5–8.1)

## 2016-10-29 MED ORDER — SODIUM CHLORIDE 0.9% FLUSH
10.0000 mL | INTRAVENOUS | Status: DC | PRN
  Administered 2016-10-29: 10 mL
  Filled 2016-10-29: qty 10

## 2016-10-29 MED ORDER — SODIUM CHLORIDE 0.9 % IV SOLN
960.0000 mg/m2 | Freq: Once | INTRAVENOUS | Status: AC
  Administered 2016-10-29: 1596 mg via INTRAVENOUS
  Filled 2016-10-29: qty 26.22

## 2016-10-29 MED ORDER — PACLITAXEL PROTEIN-BOUND CHEMO INJECTION 100 MG
125.0000 mg/m2 | Freq: Once | INTRAVENOUS | Status: AC
  Administered 2016-10-29: 200 mg via INTRAVENOUS
  Filled 2016-10-29: qty 40

## 2016-10-29 MED ORDER — SODIUM CHLORIDE 0.9 % IV SOLN
Freq: Once | INTRAVENOUS | Status: AC
  Administered 2016-10-29: 11:00:00 via INTRAVENOUS

## 2016-10-29 MED ORDER — HEPARIN SOD (PORK) LOCK FLUSH 100 UNIT/ML IV SOLN
500.0000 [IU] | Freq: Once | INTRAVENOUS | Status: AC | PRN
  Administered 2016-10-29: 500 [IU]
  Filled 2016-10-29: qty 5

## 2016-10-29 MED ORDER — PROCHLORPERAZINE MALEATE 10 MG PO TABS
10.0000 mg | ORAL_TABLET | Freq: Once | ORAL | Status: AC
  Administered 2016-10-29: 10 mg via ORAL
  Filled 2016-10-29: qty 1

## 2016-10-29 NOTE — Patient Instructions (Signed)
[facility] Discharge Instructions for Patients Receiving Chemotherapy   Beginning January 23rd 2017 lab work for the Cancer Center will be done in the  Main lab at [facility] [place] on 1st floor. If you have a lab appointment with the Cancer Center please come in thru the  Main Entrance and check in at the main information desk   Today you received the following chemotherapy agents Abraxane and Gemzar. Follow-up as scheduled. Call clinic for any questions or concerns  To help prevent nausea and vomiting after your treatment, we encourage you to take your nausea medication   If you develop nausea and vomiting, or diarrhea that is not controlled by your medication, call the clinic.  The clinic phone number is [phone]. Office hours are Monday-Friday 8:30am-5:00pm.  BELOW ARE SYMPTOMS THAT SHOULD BE REPORTED IMMEDIATELY:  *FEVER GREATER THAN 101.0 F  *CHILLS WITH OR WITHOUT FEVER  NAUSEA AND VOMITING THAT IS NOT CONTROLLED WITH YOUR NAUSEA MEDICATION  *UNUSUAL SHORTNESS OF BREATH  *UNUSUAL BRUISING OR BLEEDING  TENDERNESS IN MOUTH AND THROAT WITH OR WITHOUT PRESENCE OF ULCERS  *URINARY PROBLEMS  *BOWEL PROBLEMS  UNUSUAL RASH Items with * indicate a potential emergency and should be followed up as soon as possible. If you have an emergency after office hours please contact your primary care physician or go to the nearest emergency department.  Please call the clinic during office hours if you have any questions or concerns.   You may also contact the Patient Navigator at [phone] should you have any questions or need assistance in obtaining follow up care.      Resources For Cancer Patients and their Caregivers ? American Cancer Society: Can assist with transportation, wigs, general needs, runs Look Good Feel Better.        1-888-227-6333 ? Cancer Care: Provides financial assistance, online support groups, medication/co-pay assistance.   1-800-813-HOPE (4673) ? [facility] Assists [place] cancer patients and their families through emotional , educational and financial support.  336-427-4357 ? [place] DSS Where to apply for food stamps, Medicaid and utility assistance. 336-342-1394 ? [facility]: Transportation to medical appointments. 336-347-2287 ? Social Security Administration: May apply for disability if have a Stage IV cancer. 336-342-7796 1-800-772-1213 ? [place] Aging, Disability and Transit Services: Assists with nutrition, care and transit needs. 336-349-2343         

## 2016-10-29 NOTE — Progress Notes (Signed)
18 Labs reviewed with Dr. Talbert Cage and pt was approved for chemo tx today per MD                                                   Alecia Lemming tolerated chemo tx well without complaints or incident. VSS upon discharge. Pt discharged self ambulatory using cane in satisfactory condition

## 2016-10-29 NOTE — Progress Notes (Signed)
[place], [place] [address]   CLINIC:  Medical Oncology/Hematology  PCP:  Fayrene Helper, MD [address] [place] [address] [phone]   REASON FOR VISIT:  Follow-up for Stage IV pancreatic cancer with liver mets   CURRENT THERAPY: Gemzar/Abraxane, resumed 09/25/16   BRIEF ONCOLOGIC HISTORY:    Pancreatic cancer metastasized to liver ([place])   03/10/2014 Initial Diagnosis    Pancreatic cancer metastasized to liver      03/22/2014 - 03/05/2016 Chemotherapy    Abraxane/Gemzar days 1, 8, every 28 days.  Day 15 was cancelled due to leukopenia and thrombocytopenia on day 15 cycle 1.      05/24/2014 Treatment Plan Change    Day 8 of cycle 3 is held with ANC of 1.1      06/05/2014 Imaging    CT C/A/P . Interval decrease in size of the pancreatic tail mass.Improved hepatic metastatic disease. No new lesions. No CT findings for metastatic disease involving the chest.      08/09/2014 Tumor Marker    CA 19-9= 33 (WNL)      10/26/2014 Imaging    MRI- Continued interval decrease in size of the hepatic metastatic lesions and no new lesions are identified. Continued decrease in size of the pancreatic tail lesion.      01/03/2015 Tumor Marker    Results for SHAWNEEN, DEETZ (MRN [id number]) as of 01/18/2015 12:52  01/03/2015 10:00 CA 19-9: 14       01/17/2015 Imaging    MRI- Response to therapy of hepatic metastasis.  Similar size of a pancreatic tail lesion.      03/20/2015 Imaging    MRI-L spine- Severe disc space narrowing at L2-L3, with endplate reactive changes. Large disc extrusion into the ventral epidural space,central to the RIGHT with a cephalad migrated free fragment. Additional Large disc extrusion into the retroperitone...      03/22/2015 Imaging    CT pelvis- No evidence for metastatic disease within the pelvis.      07/24/2015 Imaging    MRI abd- Continued response to therapy, with no residual detectable  liver metastases. No new sites of metastatic disease in the abdomen.       08/15/2015 Code Status     She confirms desire for DNR status.      12/21/2015 Imaging    MRI liver- Severe image degradation due to motion artifact reducing diagnostic sensitivity and specificity. Reduce conspicuity of the pancreatic tail lesion suggesting further improvement. The original liver lesions have resolved.      03/05/2016 Treatment Plan Change    Chemotherapy holiday/Break after 2 years worth of treatment      04/07/2016 Imaging    CT chest- When compared to recent chest CT, new minimally displaced anterior left sixth rib fracture. Slight increase in subcarinal adenopathy      04/28/2016 Imaging    Bone density- BMD as determined from Femur Neck Right is 0.705 g/cm2 with a T-Score of -2.4. This patient is considered osteopenic according to [facility]) criteria. Compared with the prior study on 02/23/2013, the BMD of the lumbar spine/rt. femoral neck show a statistically significant decrease.       05/23/2016 Imaging    MRI abd- 1. Exam is significantly degraded by patient respiratory motion. Consider follow-up exams with CT abdomen with without contrast per pancreatic protocol. 2. Fullness in tail of pancreas is more prominent and potentially increased in size. Recommend close attention  on follow-up. (Consider CT as above) 3. No explanation for back pain.      09/11/2016 Imaging    MRI pancreas- Interval progression of pancreatic tail lesion. New differential perfusion right liver with areas of heterogeneity. Underlying metastatic disease in this region not excluded.      09/11/2016 Progression    MRi in conjunction with rising CA 19-9 are indicative of relapse of disease.        INTERVAL HISTORY:  [name] 83 y.o. female returns for routine follow-up for metastatic pancreatic cancer.  Patient is here for cycle 2 day 1 of chemotherapy with gemzar/abraxane today.  She states that cycle 1 of the chemo caused a lot of fatigue, more than when she previously received chemo. She also complained of a mouth sore. She denies any CP, SOB, abd pain, focal weakness.    REVIEW OF SYSTEMS:  Review of Systems  Constitutional: Positive for fatigue. Negative for chills and fever.  HENT:   Positive for mouth sores. Negative for lump/mass and nosebleeds.   Eyes: Negative.   Respiratory: Negative.  Negative for cough and shortness of breath.   Cardiovascular: Negative for chest pain and leg swelling.  Gastrointestinal: Negative for abdominal pain, blood in stool, constipation, diarrhea, nausea and vomiting.  Endocrine: Negative.   Genitourinary: Negative.  Negative for dysuria and hematuria.   Musculoskeletal: Negative for arthralgias, back pain and myalgias.  Skin: Negative.  Negative for rash.  Neurological: Negative for dizziness, extremity weakness, headaches and numbness.  Hematological: Negative.  Negative for adenopathy. Does not bruise/bleed easily.  Psychiatric/Behavioral: Negative.  Negative for depression and sleep disturbance. The patient is not nervous/anxious.      PAST MEDICAL/SURGICAL HISTORY:  Past Medical History:  Diagnosis Date  . Anemia due to antineoplastic chemotherapy 09/12/2015   Started Aranesp 500 mcg on 09/12/2015  . Anxiety   . Chronic diarrhea   . Depression   . DNR (do not resuscitate) 08/16/2015  . Erosive esophagitis   . GERD (gastroesophageal reflux disease)   . Hx of adenomatous colonic polyps    tubular adenomas, last found in 2008  . Hyperplastic colon polyp 03/19/10   tcs by Dr. Gala Romney  . Hypertension 20 years   . Kidney stone    hx/ crushed   . Opioid contract exists 04/18/2015   With Dr. Moshe Cipro  . Pancreatic cancer ([place]) 02/2014  . Pancreatic cancer metastasized to liver ([place]) 03/10/2014  . Schatzki's ring 08/26/10   Last dilated on EGD by Dr. Trevor Iha [facility], linear gastric erosions, BI hemigastrectomy   Past  Surgical History:  Procedure Laterality Date  . BREAST LUMPECTOMY Left   . bunion removal     from both feet   . CHOLECYSTECTOMY  1965   . COLONOSCOPY  03/19/2010   DR Gala Romney,, normal TI, pancolonic diverticula, random colon bx neg., hyperplastic polyps removed  . ESOPHAGOGASTRODUODENOSCOPY  11/22/2003   DR Gala Romney, erosive RE, Billroth I  . ESOPHAGOGASTRODUODENOSCOPY  08/26/10   Dr. Gala Romney- moderate severe ERE, Scahtzki ring s/p dilation, Billroth I, linear gastric erosions, bx-gastric xanthelasma  . LEFT SHOULDER SURGERY  2009   DR HARRISON  . ORIF ANKLE FRACTURE Right 05/22/2013   Procedure: OPEN REDUCTION INTERNAL FIXATION (ORIF) RIGHT ANKLE FRACTURE;  Surgeon: Sanjuana Kava, MD;  Location: [facility];  Service: Orthopedics;  Laterality: Right;  . stomach ulcer  50 years ago    had some of her stomach removed      SOCIAL HISTORY:  Social History   Social  History  . Marital status: Widowed    Spouse name: N/A  . Number of children: 2  . Years of education: N/A   Occupational History  . retired from Teacher, adult education Retired   Social History Main Topics  . Smoking status: Former Smoker    Packs/day: 0.50    Years: 60.00    Types: Cigarettes    Quit date: 09/09/2016  . Smokeless tobacco: Never Used  . Alcohol use No  . Drug use: No  . Sexual activity: No   Other Topics Concern  . Not on file   Social History Narrative  . No narrative on file    FAMILY HISTORY:  Family History  Problem Relation Age of Onset  . Hypertension Mother   . Kidney failure Brother        on dialysis  . Diabetes Sister   . Diabetes Sister   . Hypertension Father   . Kidney failure Sister   . Kidney Stones Brother   . Breast cancer Son   . Gout Son   . Liver disease Neg Hx   . Colon cancer Neg Hx     CURRENT MEDICATIONS:  Outpatient Encounter Prescriptions as of 10/29/2016  Medication Sig Note  . acetaminophen (TYLENOL) 500 MG tablet Take 500 mg by mouth every 6 (six) hours as needed for mild pain  or moderate pain.    . Calcium-Phosphorus-Vitamin D [phone] MG-MG-UNIT CHEW Chew 2 tablets by mouth daily.   . clonazePAM (KLONOPIN) 0.5 MG tablet Take 1 tablet (0.5 mg total) by mouth 2 (two) times daily as needed. for anxiety   . cycloSPORINE (RESTASIS) 0.05 % ophthalmic emulsion Place 1 drop into both eyes 2 (two) times daily.   . ferrous sulfate 325 (65 FE) MG tablet Take 325 mg by mouth daily with breakfast.   . lidocaine-prilocaine (EMLA) cream APPLY A QUARTER SIZE AMOUNT TO PORT SITE 1 HOURS PRIOR TO CHEMO DO NOT RUB IN COVER WITH PLASTIC WRAP 10/05/2016: Received treatment on 10/02/2016  . metoprolol tartrate (LOPRESSOR) 25 MG tablet TAKE 1/2 TABLET(12.5MG ) BY MOUTH TWICE DAILY   . Multiple Vitamins-Minerals (EQ ONE DAILY WOMENS 50+) TABS Take 1 tablet by mouth daily.   . pantoprazole (PROTONIX) 20 MG tablet TAKE 1 TABLET(20 MG) BY MOUTH DAILY   . Potassium 99 MG TABS Take 1 tablet by mouth daily.   . traMADol (ULTRAM) 50 MG tablet TAKE 1 TABLET BY MOUTH TWICE DAILY AS NEEDED FOR PAIN   . ondansetron (ZOFRAN) 8 MG tablet Take 1 tablet (8 mg total) by mouth 2 (two) times daily as needed (Nausea or vomiting). (Patient not taking: Reported on 10/22/2016)   . prochlorperazine (COMPAZINE) 10 MG tablet Take 1 tablet (10 mg total) by mouth every 6 (six) hours as needed (Nausea or vomiting). (Patient not taking: Reported on 10/22/2016)    Facility-Administered Encounter Medications as of 10/29/2016  Medication  . dexamethasone (DECADRON) 10 mg in sodium chloride 0.9 % 50 mL IVPB    ALLERGIES:  Allergies  Allergen Reactions  . Iohexol Hives and Shortness Of Breath    PATIENT HAD TO BE TAKEN TO ED, C/O WHELPS, HIVES, DIFFICULTY BREATHING  . Aciphex [Rabeprazole Sodium] Other (See Comments)    unknown  . Amlodipine Besylate-Valsartan     Rash to high-dose (5/320)  . Esomeprazole Magnesium   . Omeprazole Other (See Comments)    Patient states "medication didn't work"  . Ciprofloxacin Rash    . Penicillins Swelling and Rash    Has  patient had a PCN reaction causing immediate rash, facial/tongue/throat swelling, SOB or lightheadedness with hypotension: Yes Has patient had a PCN reaction causing severe rash involving mucus membranes or skin necrosis: Yes Has patient had a PCN reaction that required hospitalization: No Has patient had a PCN reaction occurring within the last 10 years: yes If all of the above answers are "NO", then may proceed with Cephalosporin use.      PHYSICAL EXAM:  ECOG Performance status: 1-2 - Symptomatic, but largely independent; may require occasional assistance.      Physical Exam  Constitutional: She is oriented to person, place, and time and well-developed, well-nourished, and in no distress. No distress.  HENT:  Head: Normocephalic and atraumatic.  Mouth/Throat: Oropharynx is clear and moist. No oropharyngeal exudate.  Eyes: Conjunctivae are normal. Pupils are equal, round, and reactive to light. No scleral icterus.  Neck: Normal range of motion. Neck supple. No JVD present.  Cardiovascular: Normal rate, regular rhythm and normal heart sounds.  Exam reveals no gallop and no friction rub.   No murmur heard. Pulmonary/Chest: Effort normal and breath sounds normal. No respiratory distress. She has no wheezes. She has no rales.  Abdominal: Soft. Bowel sounds are normal. She exhibits no distension. There is no tenderness. There is no rebound and no guarding.  Musculoskeletal: Normal range of motion. She exhibits no edema or tenderness.  Lymphadenopathy:    She has no cervical adenopathy.  Neurological: She is alert and oriented to person, place, and time. No cranial nerve deficit.  Skin: Skin is warm and dry. No rash noted. No erythema. No pallor.  Psychiatric: Mood, memory, affect and judgment normal.  Nursing note and vitals reviewed.    LABORATORY DATA:  I have reviewed the labs as listed.  CBC    Component Value Date/Time   WBC 2.9 (L)  10/29/2016 0954   RBC 3.53 (L) 10/29/2016 0954   HGB 10.2 (L) 10/29/2016 0954   HCT 30.4 (L) 10/29/2016 0954   PLT 118 (L) 10/29/2016 0954   MCV 86.1 10/29/2016 0954   MCH 28.9 10/29/2016 0954   MCHC 33.6 10/29/2016 0954   RDW 16.0 (H) 10/29/2016 0954   LYMPHSABS 1.1 10/29/2016 0954   MONOABS 0.3 10/29/2016 0954   EOSABS 0.0 10/29/2016 0954   BASOSABS 0.0 10/29/2016 0954   CMP Latest Ref Rng & Units 10/29/2016 10/22/2016 10/13/2016  Glucose 65 - 99 mg/dL 139(H) 108(H) 121(H)  BUN 6 - 20 mg/dL 19 20 18   Creatinine 0.44 - 1.00 mg/dL 1.06(H) 1.02(H) 1.47(H)  Sodium 135 - 145 mmol/L 139 137 142  Potassium 3.5 - 5.1 mmol/L 3.7 3.7 3.9  Chloride 101 - 111 mmol/L 103 102 107  CO2 22 - 32 mmol/L 27 28 24   Calcium 8.9 - 10.3 mg/dL 8.7(L) 8.8(L) 8.4(L)  Total Protein 6.5 - 8.1 g/dL 6.8 6.8 -  Total Bilirubin 0.3 - 1.2 mg/dL 0.3 0.6 -  Alkaline Phos 38 - 126 U/L 109 122 -  AST 15 - 41 U/L 22 21 -  ALT 14 - 54 U/L 17 14 -     PENDING LABS:    DIAGNOSTIC IMAGING:  MRI abd: 09/11/16 *Images and radiologic reports reviewed independently and I agree with below findings.         PATHOLOGY:  Liver biopsy: 03/09/14          ASSESSMENT & PLAN:   Stage IV pancreatic cancer with liver mets:  -Metastatic disease noted 03/10/14 with biopsy confirmed liver mets. Received chemotherapy  with Gemzar/Abraxane on days 1 & 8 every 28 day cycle; CA 19-9 normalized and subsequent imaging revealed response and later no residual detectable liver mets in 07/24/15. Chemotherapy drug holiday started in 02/2016, after 2 years of chemotherapy. Recent MRI 09/11/16 indicates progression of pancreatic tail lesion & right liver with concern for recurrent metastatic disease. CA 19-9 is rising as well. Gemzar/Abraxane on days 1 & 8 every 28 days. Day 15 omitted given previous cytopenias with initial chemotherapy in 2015.  - Proceed with cycle 2 day 1 today.  - Labs were reviewed.   Decreased appetite:    -Recommended for her to continue drinking her ENSURE supplements.    Dispo:  -Return to cancer center as scheduled for chemotherapy.  -Follow-up visit on day 1, cycle 3 of Gemzar/Abraxane.    All questions were answered to patient's stated satisfaction. Encouraged patient to call with any new concerns or questions before her next visit to the cancer center and we can certain see her sooner, if needed.    Twana First MD

## 2016-11-08 ENCOUNTER — Other Ambulatory Visit: Payer: Self-pay | Admitting: Family Medicine

## 2016-11-09 ENCOUNTER — Other Ambulatory Visit: Payer: Self-pay | Admitting: Family Medicine

## 2016-11-10 NOTE — Telephone Encounter (Signed)
Seen [phone] 

## 2016-11-19 ENCOUNTER — Encounter (HOSPITAL_BASED_OUTPATIENT_CLINIC_OR_DEPARTMENT_OTHER): Payer: Medicare HMO | Admitting: Oncology

## 2016-11-19 ENCOUNTER — Encounter (HOSPITAL_COMMUNITY): Payer: Medicare HMO | Attending: Oncology

## 2016-11-19 VITALS — BP 173/72 | HR 57 | Temp 97.9°F | Resp 18

## 2016-11-19 VITALS — BP 144/63 | HR 59 | Temp 98.0°F | Resp 16 | Wt 127.0 lb

## 2016-11-19 DIAGNOSIS — C787 Secondary malignant neoplasm of liver and intrahepatic bile duct: Secondary | ICD-10-CM

## 2016-11-19 DIAGNOSIS — Z833 Family history of diabetes mellitus: Secondary | ICD-10-CM | POA: Diagnosis not present

## 2016-11-19 DIAGNOSIS — F329 Major depressive disorder, single episode, unspecified: Secondary | ICD-10-CM | POA: Insufficient documentation

## 2016-11-19 DIAGNOSIS — Z87891 Personal history of nicotine dependence: Secondary | ICD-10-CM | POA: Insufficient documentation

## 2016-11-19 DIAGNOSIS — Z87442 Personal history of urinary calculi: Secondary | ICD-10-CM | POA: Diagnosis not present

## 2016-11-19 DIAGNOSIS — Z8249 Family history of ischemic heart disease and other diseases of the circulatory system: Secondary | ICD-10-CM | POA: Insufficient documentation

## 2016-11-19 DIAGNOSIS — C259 Malignant neoplasm of pancreas, unspecified: Secondary | ICD-10-CM | POA: Diagnosis not present

## 2016-11-19 DIAGNOSIS — C252 Malignant neoplasm of tail of pancreas: Secondary | ICD-10-CM

## 2016-11-19 DIAGNOSIS — F419 Anxiety disorder, unspecified: Secondary | ICD-10-CM | POA: Diagnosis not present

## 2016-11-19 DIAGNOSIS — Z8719 Personal history of other diseases of the digestive system: Secondary | ICD-10-CM | POA: Diagnosis not present

## 2016-11-19 DIAGNOSIS — K219 Gastro-esophageal reflux disease without esophagitis: Secondary | ICD-10-CM | POA: Insufficient documentation

## 2016-11-19 DIAGNOSIS — Z9049 Acquired absence of other specified parts of digestive tract: Secondary | ICD-10-CM | POA: Insufficient documentation

## 2016-11-19 DIAGNOSIS — Z5111 Encounter for antineoplastic chemotherapy: Secondary | ICD-10-CM

## 2016-11-19 DIAGNOSIS — R350 Frequency of micturition: Secondary | ICD-10-CM | POA: Insufficient documentation

## 2016-11-19 DIAGNOSIS — Z9889 Other specified postprocedural states: Secondary | ICD-10-CM | POA: Diagnosis not present

## 2016-11-19 DIAGNOSIS — Z808 Family history of malignant neoplasm of other organs or systems: Secondary | ICD-10-CM | POA: Diagnosis not present

## 2016-11-19 DIAGNOSIS — R630 Anorexia: Secondary | ICD-10-CM

## 2016-11-19 DIAGNOSIS — I10 Essential (primary) hypertension: Secondary | ICD-10-CM | POA: Diagnosis not present

## 2016-11-19 DIAGNOSIS — R69 Illness, unspecified: Secondary | ICD-10-CM | POA: Diagnosis not present

## 2016-11-19 DIAGNOSIS — M549 Dorsalgia, unspecified: Secondary | ICD-10-CM | POA: Diagnosis not present

## 2016-11-19 DIAGNOSIS — Z9221 Personal history of antineoplastic chemotherapy: Secondary | ICD-10-CM | POA: Insufficient documentation

## 2016-11-19 DIAGNOSIS — Z801 Family history of malignant neoplasm of trachea, bronchus and lung: Secondary | ICD-10-CM | POA: Diagnosis not present

## 2016-11-19 DIAGNOSIS — Z841 Family history of disorders of kidney and ureter: Secondary | ICD-10-CM | POA: Diagnosis not present

## 2016-11-19 DIAGNOSIS — G8929 Other chronic pain: Secondary | ICD-10-CM | POA: Diagnosis not present

## 2016-11-19 DIAGNOSIS — Z66 Do not resuscitate: Secondary | ICD-10-CM | POA: Insufficient documentation

## 2016-11-19 DIAGNOSIS — E876 Hypokalemia: Secondary | ICD-10-CM | POA: Diagnosis not present

## 2016-11-19 LAB — CBC WITH DIFFERENTIAL/PLATELET
BASOS ABS: 0 10*3/uL (ref 0.0–0.1)
Basophils Relative: 1 %
EOS ABS: 0.2 10*3/uL (ref 0.0–0.7)
Eosinophils Relative: 4 %
HCT: 31.3 % — ABNORMAL LOW (ref 36.0–46.0)
HEMOGLOBIN: 10.4 g/dL — AB (ref 12.0–15.0)
LYMPHS PCT: 28 %
Lymphs Abs: 1.3 10*3/uL (ref 0.7–4.0)
MCH: 28.7 pg (ref 26.0–34.0)
MCHC: 33.2 g/dL (ref 30.0–36.0)
MCV: 86.5 fL (ref 78.0–100.0)
Monocytes Absolute: 0.7 10*3/uL (ref 0.1–1.0)
Monocytes Relative: 15 %
NEUTROS ABS: 2.4 10*3/uL (ref 1.7–7.7)
NEUTROS PCT: 52 %
PLATELETS: 228 10*3/uL (ref 150–400)
RBC: 3.62 MIL/uL — ABNORMAL LOW (ref 3.87–5.11)
RDW: 17.9 % — ABNORMAL HIGH (ref 11.5–15.5)
WBC: 4.6 10*3/uL (ref 4.0–10.5)

## 2016-11-19 LAB — COMPREHENSIVE METABOLIC PANEL
ALK PHOS: 128 U/L — AB (ref 38–126)
ALT: 16 U/L (ref 14–54)
ANION GAP: 7 (ref 5–15)
AST: 23 U/L (ref 15–41)
Albumin: 3.5 g/dL (ref 3.5–5.0)
BILIRUBIN TOTAL: 0.7 mg/dL (ref 0.3–1.2)
BUN: 18 mg/dL (ref 6–20)
CALCIUM: 9 mg/dL (ref 8.9–10.3)
CO2: 28 mmol/L (ref 22–32)
Chloride: 101 mmol/L (ref 101–111)
Creatinine, Ser: 0.98 mg/dL (ref 0.44–1.00)
GFR calc Af Amer: 59 mL/min — ABNORMAL LOW (ref >60)
GFR calc non Af Amer: 51 mL/min — ABNORMAL LOW (ref >60)
Glucose, Bld: 107 mg/dL — ABNORMAL HIGH (ref 65–99)
Potassium: 3.4 mmol/L — ABNORMAL LOW (ref 3.5–5.1)
Sodium: 136 mmol/L (ref 135–145)
TOTAL PROTEIN: 6.9 g/dL (ref 6.5–8.1)

## 2016-11-19 MED ORDER — SODIUM CHLORIDE 0.9 % IV SOLN
Freq: Once | INTRAVENOUS | Status: AC
  Administered 2016-11-19: 13:00:00 via INTRAVENOUS

## 2016-11-19 MED ORDER — HEPARIN SOD (PORK) LOCK FLUSH 100 UNIT/ML IV SOLN
500.0000 [IU] | Freq: Once | INTRAVENOUS | Status: AC | PRN
  Administered 2016-11-19: 500 [IU]
  Filled 2016-11-19 (×2): qty 5

## 2016-11-19 MED ORDER — PACLITAXEL PROTEIN-BOUND CHEMO INJECTION 100 MG
125.0000 mg/m2 | Freq: Once | INTRAVENOUS | Status: AC
  Administered 2016-11-19: 200 mg via INTRAVENOUS
  Filled 2016-11-19: qty 40

## 2016-11-19 MED ORDER — POTASSIUM CHLORIDE CRYS ER 20 MEQ PO TBCR
40.0000 meq | EXTENDED_RELEASE_TABLET | Freq: Once | ORAL | Status: AC
  Administered 2016-11-19: 40 meq via ORAL
  Filled 2016-11-19: qty 2

## 2016-11-19 MED ORDER — SODIUM CHLORIDE 0.9% FLUSH
10.0000 mL | INTRAVENOUS | Status: DC | PRN
  Administered 2016-11-19: 10 mL
  Filled 2016-11-19: qty 10

## 2016-11-19 MED ORDER — GEMCITABINE HCL CHEMO INJECTION 1 GM/26.3ML
970.0000 mg/m2 | Freq: Once | INTRAVENOUS | Status: AC
  Administered 2016-11-19: 1596 mg via INTRAVENOUS
  Filled 2016-11-19: qty 26.3

## 2016-11-19 MED ORDER — PROCHLORPERAZINE MALEATE 10 MG PO TABS
10.0000 mg | ORAL_TABLET | Freq: Once | ORAL | Status: AC
  Administered 2016-11-19: 10 mg via ORAL
  Filled 2016-11-19: qty 1

## 2016-11-19 NOTE — Patient Instructions (Signed)
[facility] Discharge Instructions for Patients Receiving Chemotherapy   Beginning January 23rd 2017 lab work for the Cancer Center will be done in the  Main lab at [facility] on 1st floor. If you have a lab appointment with the Cancer Center please come in thru the  Main Entrance and check in at the main information desk   Today you received the following chemotherapy agents   To help prevent nausea and vomiting after your treatment, we encourage you to take your nausea medication     If you develop nausea and vomiting, or diarrhea that is not controlled by your medication, call the clinic.  The clinic phone number is [phone]. Office hours are Monday-Friday 8:30am-5:00pm.  BELOW ARE SYMPTOMS THAT SHOULD BE REPORTED IMMEDIATELY:  *FEVER GREATER THAN 101.0 F  *CHILLS WITH OR WITHOUT FEVER  NAUSEA AND VOMITING THAT IS NOT CONTROLLED WITH YOUR NAUSEA MEDICATION  *UNUSUAL SHORTNESS OF BREATH  *UNUSUAL BRUISING OR BLEEDING  TENDERNESS IN MOUTH AND THROAT WITH OR WITHOUT PRESENCE OF ULCERS  *URINARY PROBLEMS  *BOWEL PROBLEMS  UNUSUAL RASH Items with * indicate a potential emergency and should be followed up as soon as possible. If you have an emergency after office hours please contact your primary care physician or go to the nearest emergency department.  Please call the clinic during office hours if you have any questions or concerns.   You may also contact the Patient Navigator at [phone] should you have any questions or need assistance in obtaining follow up care.      Resources For Cancer Patients and their Caregivers ? American Cancer Society: Can assist with transportation, wigs, general needs, runs Look Good Feel Better.        1-888-227-6333 ? Cancer Care: Provides financial assistance, online support groups, medication/co-pay assistance.  1-800-813-HOPE (4673) ? [facility] Assists [place] cancer  patients and their families through emotional , educational and financial support.  336-427-4357 ? [place] DSS Where to apply for food stamps, Medicaid and utility assistance. 336-342-1394 ? [facility]: Transportation to medical appointments. 336-347-2287 ? Social Security Administration: May apply for disability if have a Stage IV cancer. 336-342-7796 1-800-772-1213 ? [place] Aging, Disability and Transit Services: Assists with nutrition, care and transit needs. 336-349-2343         

## 2016-11-19 NOTE — Progress Notes (Signed)
[place], [place] [address]   CLINIC:  Medical Oncology/Hematology  PCP:  [name] Helper, MD [address] [place] [address] [phone]   REASON FOR VISIT:  Follow-up for Stage IV pancreatic cancer with liver mets   CURRENT THERAPY: Gemzar/Abraxane, resumed 09/25/16   BRIEF ONCOLOGIC HISTORY:    Pancreatic cancer metastasized to liver ([place])   03/10/2014 Initial Diagnosis    Pancreatic cancer metastasized to liver      03/22/2014 - 03/05/2016 Chemotherapy    Abraxane/Gemzar days 1, 8, every 28 days.  Day 15 was cancelled due to leukopenia and thrombocytopenia on day 15 cycle 1.      05/24/2014 Treatment Plan Change    Day 8 of cycle 3 is held with ANC of 1.1      06/05/2014 Imaging    CT C/A/P . Interval decrease in size of the pancreatic tail mass.Improved hepatic metastatic disease. No new lesions. No CT findings for metastatic disease involving the chest.      08/09/2014 Tumor Marker    CA 19-9= 33 (WNL)      10/26/2014 Imaging    MRI- Continued interval decrease in size of the hepatic metastatic lesions and no new lesions are identified. Continued decrease in size of the pancreatic tail lesion.      01/03/2015 Tumor Marker    Results for [name], [name] (MRN [id number]) as of 01/18/2015 12:52  01/03/2015 10:00 CA 19-9: 14       01/17/2015 Imaging    MRI- Response to therapy of hepatic metastasis.  Similar size of a pancreatic tail lesion.      03/20/2015 Imaging    MRI-L spine- Severe disc space narrowing at L2-L3, with endplate reactive changes. Large disc extrusion into the ventral epidural space,central to the RIGHT with a cephalad migrated free fragment. Additional Large disc extrusion into the retroperitone...      03/22/2015 Imaging    CT pelvis- No evidence for metastatic disease within the pelvis.      07/24/2015 Imaging    MRI abd- Continued response to therapy, with no residual detectable  liver metastases. No new sites of metastatic disease in the abdomen.       08/15/2015 Code Status     She confirms desire for DNR status.      12/21/2015 Imaging    MRI liver- Severe image degradation due to motion artifact reducing diagnostic sensitivity and specificity. Reduce conspicuity of the pancreatic tail lesion suggesting further improvement. The original liver lesions have resolved.      03/05/2016 Treatment Plan Change    Chemotherapy holiday/Break after 2 years worth of treatment      04/07/2016 Imaging    CT chest- When compared to recent chest CT, new minimally displaced anterior left sixth rib fracture. Slight increase in subcarinal adenopathy      04/28/2016 Imaging    Bone density- BMD as determined from Femur Neck Right is 0.705 g/cm2 with a T-Score of -2.4. This patient is considered osteopenic according to [facility]) criteria. Compared with the prior study on 02/23/2013, the BMD of the lumbar spine/rt. femoral neck show a statistically significant decrease.       05/23/2016 Imaging    MRI abd- 1. Exam is significantly degraded by patient respiratory motion. Consider follow-up exams with CT abdomen with without contrast per pancreatic protocol. 2. Fullness in tail of pancreas is more prominent and potentially increased in size. Recommend close attention  on follow-up. (Consider CT as above) 3. No explanation for back pain.      09/11/2016 Imaging    MRI pancreas- Interval progression of pancreatic tail lesion. New differential perfusion right liver with areas of heterogeneity. Underlying metastatic disease in this region not excluded.      09/11/2016 Progression    MRi in conjunction with rising CA 19-9 are indicative of relapse of disease.        INTERVAL HISTORY:  [name] 83 y.o. female returns for routine follow-up for metastatic pancreatic cancer.  Patient is here for cycle 3 day 1 of chemotherapy with gemzar/abraxane today.  Patient states that she had an episode of feeling sad few weeks ago when she died about her current condition that she is no longer sad now. She states that she has been losing weight, and has lost 2 pounds since her last visit. She states she eats 3 meals a day but has difficulty drinking ensure supplements because they get her very full. She has intermittent diarrhea, 1-2 episodes every few weeks which is controlled with Imodium. She stated that she had a mouth sore at the corner of her mouth, however has resolved. She complains of thinning hair. She denies any CP, SOB, abd pain, focal weakness.    REVIEW OF SYSTEMS:  Review of Systems  Constitutional: Positive for fatigue. Negative for chills and fever.  HENT:   Negative for lump/mass, mouth sores and nosebleeds.   Eyes: Negative.   Respiratory: Negative.  Negative for cough and shortness of breath.   Cardiovascular: Negative for chest pain and leg swelling.  Gastrointestinal: Negative for abdominal pain, blood in stool, constipation, diarrhea, nausea and vomiting.  Endocrine: Negative.   Genitourinary: Negative.  Negative for dysuria and hematuria.   Musculoskeletal: Negative for arthralgias, back pain and myalgias.  Skin: Negative.  Negative for rash.  Neurological: Negative for dizziness, extremity weakness, headaches and numbness.  Hematological: Negative.  Negative for adenopathy. Does not bruise/bleed easily.  Psychiatric/Behavioral: Negative.  Negative for depression and sleep disturbance. The patient is not nervous/anxious.      PAST MEDICAL/SURGICAL HISTORY:  Past Medical History:  Diagnosis Date  . Anemia due to antineoplastic chemotherapy 09/12/2015   Started Aranesp 500 mcg on 09/12/2015  . Anxiety   . Chronic diarrhea   . Depression   . DNR (do not resuscitate) 08/16/2015  . Erosive esophagitis   . GERD (gastroesophageal reflux disease)   . Hx of adenomatous colonic polyps    tubular adenomas, last found in 2008  .  Hyperplastic colon polyp 03/19/10   tcs by Dr. Gala Romney  . Hypertension 20 years   . Kidney stone    hx/ crushed   . Opioid contract exists 04/18/2015   With Dr. Moshe Cipro  . Pancreatic cancer ([place]) 02/2014  . Pancreatic cancer metastasized to liver ([place]) 03/10/2014  . Schatzki's ring 08/26/10   Last dilated on EGD by Dr. Trevor Iha [facility], linear gastric erosions, BI hemigastrectomy   Past Surgical History:  Procedure Laterality Date  . BREAST LUMPECTOMY Left   . bunion removal     from both feet   . CHOLECYSTECTOMY  1965   . COLONOSCOPY  03/19/2010   DR Gala Romney,, normal TI, pancolonic diverticula, random colon bx neg., hyperplastic polyps removed  . ESOPHAGOGASTRODUODENOSCOPY  11/22/2003   DR Gala Romney, erosive RE, Billroth I  . ESOPHAGOGASTRODUODENOSCOPY  08/26/10   Dr. Gala Romney- moderate severe ERE, Scahtzki ring s/p dilation, Billroth I, linear gastric erosions, bx-gastric xanthelasma  . LEFT  SHOULDER SURGERY  2009   DR HARRISON  . ORIF ANKLE FRACTURE Right 05/22/2013   Procedure: OPEN REDUCTION INTERNAL FIXATION (ORIF) RIGHT ANKLE FRACTURE;  Surgeon: Sanjuana Kava, MD;  Location: [facility];  Service: Orthopedics;  Laterality: Right;  . stomach ulcer  50 years ago    had some of her stomach removed      SOCIAL HISTORY:  Social History   Social History  . Marital status: Widowed    Spouse name: N/A  . Number of children: 2  . Years of education: N/A   Occupational History  . retired from Teacher, adult education Retired   Social History Main Topics  . Smoking status: Former Smoker    Packs/day: 0.50    Years: 60.00    Types: Cigarettes    Quit date: 09/09/2016  . Smokeless tobacco: Never Used  . Alcohol use No  . Drug use: No  . Sexual activity: No   Other Topics Concern  . Not on file   Social History Narrative  . No narrative on file    FAMILY HISTORY:  Family History  Problem Relation Age of Onset  . Hypertension Mother   . Kidney failure Brother        on dialysis  . Diabetes Sister     . Diabetes Sister   . Hypertension Father   . Kidney failure Sister   . Kidney Stones Brother   . Breast cancer Son   . Gout Son   . Liver disease Neg Hx   . Colon cancer Neg Hx     CURRENT MEDICATIONS:  Outpatient Encounter Prescriptions as of 11/19/2016  Medication Sig Note  . acetaminophen (TYLENOL) 500 MG tablet Take 500 mg by mouth every 6 (six) hours as needed for mild pain or moderate pain.    Marland Kitchen amLODipine (NORVASC) 10 MG tablet TAKE 1 TABLET BY MOUTH EVERY DAY   . Calcium-Phosphorus-Vitamin D [phone] MG-MG-UNIT CHEW Chew 2 tablets by mouth daily.   . clonazePAM (KLONOPIN) 0.5 MG tablet Take 1 tablet (0.5 mg total) by mouth 2 (two) times daily as needed. for anxiety   . cycloSPORINE (RESTASIS) 0.05 % ophthalmic emulsion Place 1 drop into both eyes 2 (two) times daily.   . ferrous sulfate 325 (65 FE) MG tablet Take 325 mg by mouth daily with breakfast.   . lidocaine-prilocaine (EMLA) cream APPLY A QUARTER SIZE AMOUNT TO PORT SITE 1 HOURS PRIOR TO CHEMO DO NOT RUB IN COVER WITH PLASTIC WRAP 10/05/2016: Received treatment on 10/02/2016  . metoprolol tartrate (LOPRESSOR) 25 MG tablet TAKE 1/2 TABLET(12.5MG ) BY MOUTH TWICE DAILY   . Multiple Vitamins-Minerals (EQ ONE DAILY WOMENS 50+) TABS Take 1 tablet by mouth daily.   . ondansetron (ZOFRAN) 8 MG tablet Take 1 tablet (8 mg total) by mouth 2 (two) times daily as needed (Nausea or vomiting).   . pantoprazole (PROTONIX) 20 MG tablet TAKE 1 TABLET(20 MG) BY MOUTH DAILY   . Potassium 99 MG TABS Take 1 tablet by mouth daily.   . prochlorperazine (COMPAZINE) 10 MG tablet Take 1 tablet (10 mg total) by mouth every 6 (six) hours as needed (Nausea or vomiting).   . traMADol (ULTRAM) 50 MG tablet TAKE 1 TABLET BY MOUTH TWICE DAILY AS NEEDED FOR PAIN    Facility-Administered Encounter Medications as of 11/19/2016  Medication  . dexamethasone (DECADRON) 10 mg in sodium chloride 0.9 % 50 mL IVPB    ALLERGIES:  Allergies  Allergen  Reactions  . Iohexol Hives and Shortness  Of Breath    PATIENT HAD TO BE TAKEN TO ED, C/O WHELPS, HIVES, DIFFICULTY BREATHING  . Aciphex [Rabeprazole Sodium] Other (See Comments)    unknown  . Amlodipine Besylate-Valsartan     Rash to high-dose (5/320)  . Esomeprazole Magnesium   . Omeprazole Other (See Comments)    Patient states "medication didn't work"  . Ciprofloxacin Rash  . Penicillins Swelling and Rash    Has patient had a PCN reaction causing immediate rash, facial/tongue/throat swelling, SOB or lightheadedness with hypotension: Yes Has patient had a PCN reaction causing severe rash involving mucus membranes or skin necrosis: Yes Has patient had a PCN reaction that required hospitalization: No Has patient had a PCN reaction occurring within the last 10 years: yes If all of the above answers are "NO", then may proceed with Cephalosporin use.      PHYSICAL EXAM:  ECOG Performance status: 1-2 - Symptomatic, but largely independent; may require occasional assistance.      Physical Exam  Constitutional: She is oriented to person, place, and time and well-developed, well-nourished, and in no distress. No distress.  HENT:  Head: Normocephalic and atraumatic.  Mouth/Throat: Oropharynx is clear and moist. No oropharyngeal exudate.  Eyes: Conjunctivae are normal. Pupils are equal, round, and reactive to light. No scleral icterus.  Neck: Normal range of motion. Neck supple. No JVD present.  Cardiovascular: Normal rate, regular rhythm and normal heart sounds.  Exam reveals no gallop and no friction rub.   No murmur heard. Pulmonary/Chest: Effort normal and breath sounds normal. No respiratory distress. She has no wheezes. She has no rales.  Abdominal: Soft. Bowel sounds are normal. She exhibits no distension. There is no tenderness. There is no rebound and no guarding.  Musculoskeletal: Normal range of motion. She exhibits no edema or tenderness.  Lymphadenopathy:    She has no  cervical adenopathy.  Neurological: She is alert and oriented to person, place, and time. No cranial nerve deficit.  Skin: Skin is warm and dry. No rash noted. No erythema. No pallor.  Psychiatric: Mood, memory, affect and judgment normal.  Nursing note and vitals reviewed.    LABORATORY DATA:  I have reviewed the labs as listed.  CBC    Component Value Date/Time   WBC 2.9 (L) 10/29/2016 0954   RBC 3.53 (L) 10/29/2016 0954   HGB 10.2 (L) 10/29/2016 0954   HCT 30.4 (L) 10/29/2016 0954   PLT 118 (L) 10/29/2016 0954   MCV 86.1 10/29/2016 0954   MCH 28.9 10/29/2016 0954   MCHC 33.6 10/29/2016 0954   RDW 16.0 (H) 10/29/2016 0954   LYMPHSABS 1.1 10/29/2016 0954   MONOABS 0.3 10/29/2016 0954   EOSABS 0.0 10/29/2016 0954   BASOSABS 0.0 10/29/2016 0954   CMP Latest Ref Rng & Units 10/29/2016 10/22/2016 10/13/2016  Glucose 65 - 99 mg/dL 139(H) 108(H) 121(H)  BUN 6 - 20 mg/dL 19 20 18   Creatinine 0.44 - 1.00 mg/dL 1.06(H) 1.02(H) 1.47(H)  Sodium 135 - 145 mmol/L 139 137 142  Potassium 3.5 - 5.1 mmol/L 3.7 3.7 3.9  Chloride 101 - 111 mmol/L 103 102 107  CO2 22 - 32 mmol/L 27 28 24   Calcium 8.9 - 10.3 mg/dL 8.7(L) 8.8(L) 8.4(L)  Total Protein 6.5 - 8.1 g/dL 6.8 6.8 -  Total Bilirubin 0.3 - 1.2 mg/dL 0.3 0.6 -  Alkaline Phos 38 - 126 U/L 109 122 -  AST 15 - 41 U/L 22 21 -  ALT 14 - 54 U/L 17 14 -  PENDING LABS:    DIAGNOSTIC IMAGING:  MRI abd: 09/11/16 *Images and radiologic reports reviewed independently and I agree with below findings.         PATHOLOGY:  Liver biopsy: 03/09/14          ASSESSMENT & PLAN:   Stage IV pancreatic cancer with liver mets:  -Metastatic disease noted 03/10/14 with biopsy confirmed liver mets. Received chemotherapy with Gemzar/Abraxane on days 1 & 8 every 28 day cycle; CA 19-9 normalized and subsequent imaging revealed response and later no residual detectable liver mets in 07/24/15. Chemotherapy drug holiday started in 02/2016, after 2  years of chemotherapy. Recent MRI 09/11/16 indicates progression of pancreatic tail lesion & right liver with concern for recurrent metastatic disease. CA 19-9 is rising as well. Gemzar/Abraxane on days 1 & 8 every 28 days. Day 15 omitted given previous cytopenias with initial chemotherapy in 2015.  - Proceed with cycle 3 day 1 today.  - Labs were reviewed.  - Plan to restage her with CTs after cycle 4 of chemo.  Decreased appetite:  -Recommended for her to continue drinking her ENSURE supplements as tolerated. If she is not able to drink these, then I recommended for her to have highly nutritious snacks in between meals with a mixture of protein/carbs.    Dispo:  -Return to cancer center as scheduled for chemotherapy.  -Follow-up visit on day 1, cycle 4 of Gemzar/Abraxane.    All questions were answered to patient's stated satisfaction. Encouraged patient to call with any new concerns or questions before her next visit to the cancer center and we can certain see her sooner, if needed.    Twana First MD

## 2016-11-20 LAB — CANCER ANTIGEN 19-9: CA 19-9: 348 U/mL — ABNORMAL HIGH (ref 0–35)

## 2016-11-26 ENCOUNTER — Encounter (HOSPITAL_COMMUNITY): Payer: Self-pay

## 2016-11-26 ENCOUNTER — Encounter (HOSPITAL_BASED_OUTPATIENT_CLINIC_OR_DEPARTMENT_OTHER): Payer: Medicare HMO | Admitting: Oncology

## 2016-11-26 ENCOUNTER — Encounter (HOSPITAL_BASED_OUTPATIENT_CLINIC_OR_DEPARTMENT_OTHER): Payer: Medicare HMO

## 2016-11-26 ENCOUNTER — Ambulatory Visit (HOSPITAL_COMMUNITY)
Admission: RE | Admit: 2016-11-26 | Discharge: 2016-11-26 | Disposition: A | Discharge status: Home / Self Care | Payer: Medicare HMO | Source: Ambulatory Visit | Attending: Oncology | Admitting: Oncology

## 2016-11-26 DIAGNOSIS — Z96611 Presence of right artificial shoulder joint: Secondary | ICD-10-CM | POA: Diagnosis not present

## 2016-11-26 DIAGNOSIS — C787 Secondary malignant neoplasm of liver and intrahepatic bile duct: Secondary | ICD-10-CM

## 2016-11-26 DIAGNOSIS — Z471 Aftercare following joint replacement surgery: Secondary | ICD-10-CM | POA: Diagnosis not present

## 2016-11-26 DIAGNOSIS — M75101 Unspecified rotator cuff tear or rupture of right shoulder, not specified as traumatic: Secondary | ICD-10-CM | POA: Insufficient documentation

## 2016-11-26 DIAGNOSIS — C259 Malignant neoplasm of pancreas, unspecified: Secondary | ICD-10-CM | POA: Diagnosis not present

## 2016-11-26 DIAGNOSIS — R350 Frequency of micturition: Secondary | ICD-10-CM | POA: Diagnosis not present

## 2016-11-26 DIAGNOSIS — M12811 Other specific arthropathies, not elsewhere classified, right shoulder: Secondary | ICD-10-CM | POA: Insufficient documentation

## 2016-11-26 DIAGNOSIS — Z9221 Personal history of antineoplastic chemotherapy: Secondary | ICD-10-CM | POA: Diagnosis not present

## 2016-11-26 DIAGNOSIS — C252 Malignant neoplasm of tail of pancreas: Secondary | ICD-10-CM

## 2016-11-26 DIAGNOSIS — M899 Disorder of bone, unspecified: Secondary | ICD-10-CM | POA: Diagnosis not present

## 2016-11-26 DIAGNOSIS — G8929 Other chronic pain: Secondary | ICD-10-CM | POA: Diagnosis not present

## 2016-11-26 DIAGNOSIS — E876 Hypokalemia: Secondary | ICD-10-CM | POA: Diagnosis not present

## 2016-11-26 DIAGNOSIS — M549 Dorsalgia, unspecified: Secondary | ICD-10-CM | POA: Diagnosis not present

## 2016-11-26 DIAGNOSIS — R69 Illness, unspecified: Secondary | ICD-10-CM | POA: Diagnosis not present

## 2016-11-26 DIAGNOSIS — Z66 Do not resuscitate: Secondary | ICD-10-CM | POA: Diagnosis not present

## 2016-11-26 LAB — COMPREHENSIVE METABOLIC PANEL
ALT: 18 U/L (ref 14–54)
AST: 22 U/L (ref 15–41)
Albumin: 3.2 g/dL — ABNORMAL LOW (ref 3.5–5.0)
Alkaline Phosphatase: 113 U/L (ref 38–126)
Anion gap: 8 (ref 5–15)
BUN: 18 mg/dL (ref 6–20)
CO2: 28 mmol/L (ref 22–32)
Calcium: 8.7 mg/dL — ABNORMAL LOW (ref 8.9–10.3)
Chloride: 102 mmol/L (ref 101–111)
Creatinine, Ser: 0.8 mg/dL (ref 0.44–1.00)
GFR calc non Af Amer: >60 mL/min (ref >60)
Glucose, Bld: 93 mg/dL (ref 65–99)
POTASSIUM: 3.6 mmol/L (ref 3.5–5.1)
SODIUM: 138 mmol/L (ref 135–145)
Total Bilirubin: 0.6 mg/dL (ref 0.3–1.2)
Total Protein: 6.8 g/dL (ref 6.5–8.1)

## 2016-11-26 LAB — CBC WITH DIFFERENTIAL/PLATELET
BASOS ABS: 0 10*3/uL (ref 0.0–0.1)
Basophils Relative: 0 %
EOS ABS: 0.1 10*3/uL (ref 0.0–0.7)
EOS PCT: 2 %
HCT: 27.7 % — ABNORMAL LOW (ref 36.0–46.0)
Hemoglobin: 9.5 g/dL — ABNORMAL LOW (ref 12.0–15.0)
Lymphocytes Relative: 55 %
Lymphs Abs: 1.3 10*3/uL (ref 0.7–4.0)
MCH: 29.4 pg (ref 26.0–34.0)
MCHC: 34.3 g/dL (ref 30.0–36.0)
MCV: 85.8 fL (ref 78.0–100.0)
MONO ABS: 0.3 10*3/uL (ref 0.1–1.0)
Monocytes Relative: 12 %
Neutro Abs: 0.7 10*3/uL — ABNORMAL LOW (ref 1.7–7.7)
Neutrophils Relative %: 31 %
PLATELETS: 89 10*3/uL — AB (ref 150–400)
RBC: 3.23 MIL/uL — AB (ref 3.87–5.11)
RDW: 16.8 % — AB (ref 11.5–15.5)
WBC: 2.3 10*3/uL — AB (ref 4.0–10.5)

## 2016-11-26 MED ORDER — HEPARIN SOD (PORK) LOCK FLUSH 100 UNIT/ML IV SOLN
500.0000 [IU] | Freq: Once | INTRAVENOUS | Status: AC
Start: 2016-11-26 — End: 2016-11-26
  Administered 2016-11-26: 500 [IU] via INTRAVENOUS

## 2016-11-26 MED ORDER — SODIUM CHLORIDE 0.9% FLUSH
20.0000 mL | INTRAVENOUS | Status: DC | PRN
  Administered 2016-11-26: 20 mL via INTRAVENOUS
  Filled 2016-11-26: qty 20

## 2016-11-26 NOTE — Progress Notes (Signed)
Patient presents today for systemic chemotherapy as scheduled. Patient raised a concern about a new lump/bump near her sternum and therefore nursing asked me see the patient.  Patient denies any trauma or falls. She denies any pain. On inspection, she has an obvious enlargement of her right sternal border where the clavicle and sternum meet. She denies any history of fracture. She notes that it started approximately 2 weeks ago. She notes that it is new.  On palpation, there is no discomfort or pain. There is an obvious asymmetry associated with a area compared to the left side. No palpable lymphadenopathy appreciated. The enlargement is hard/firm to palpation.  She will move forward with chemotherapy today as planned. Order is placed for an x-ray of the right clavicle to evaluate for fracture, more specifically a pathologic fracture.  Robynn Pane, PA-C 11/26/2016 12:13 PM

## 2016-11-26 NOTE — Progress Notes (Signed)
Labs, including ANC 0.7 and Platelets 89, reviewed with Dr. Talbert Cage and pt's chemo tx will be held today and rescheduled for next week per MD orders. Port flushed per protocol then de-accessed. Pt has a firm knot, about the size of a small egg, along her right clavicle on her chest that has been there approx 2 days but is not painful.She denies any recent injury to this area. Kirby Crigler PA assessed the area and pt is to go by radiology upon discharge today for an X-ray.Pt verbalized understanding of lab results and radiology appt today as well as to call for any signs of active bleeding or any fever or chills.VSS Pt discharged self ambulatory using cane in satisfactory condition

## 2016-11-26 NOTE — Patient Instructions (Signed)
[place] at [facility] Discharge Instructions  RECOMMENDATIONS MADE BY THE CONSULTANT AND ANY TEST RESULTS WILL BE SENT TO YOUR REFERRING PHYSICIAN.  Labs drawn thru port today but chemo tx held due to low blood counts. Chemo tx rescheduled for next week. Follow-up as scheduled. Call clinic for any questions or concerns  Thank you for choosing [place] at [facility] to provide your oncology and hematology care.  To afford each patient quality time with our provider, please arrive at least 15 minutes before your scheduled appointment time.    If you have a lab appointment with the [facility] please come in thru the  Main Entrance and check in at the main information desk  You need to re-schedule your appointment should you arrive 10 or more minutes late.  We strive to give you quality time with our providers, and arriving late affects you and other patients whose appointments are after yours.  Also, if you no show three or more times for appointments you may be dismissed from the clinic at the providers discretion.     Again, thank you for choosing [facility].  Our hope is that these requests will decrease the amount of time that you wait before being seen by our physicians.       _____________________________________________________________  Should you have questions after your visit to [facility], please contact our office at [phone] between the hours of 8:30 a.m. and 4:30 p.m.  Voicemails left after 4:30 p.m. will not be returned until the following business day.  For prescription refill requests, have your pharmacy contact our office.       Resources For Cancer Patients and their Caregivers ? American Cancer Society: Can assist with transportation, wigs, general needs, runs Look Good Feel Better.        ([phone] 463-5049 ? Cancer Care: Provides financial assistance, online support groups,  medication/co-pay assistance.  1-800-813-HOPE 920-551-2436) ? [place] Assists [place] cancer patients and their families through emotional , educational and financial support.  573-046-3339 ? [place] DSS Where to apply for food stamps, Medicaid and utility assistance. 435 872 8750 ? [facility]: Transportation to medical appointments. (463) 644-1830 ? Social Security Administration: May apply for disability if have a Stage IV cancer. 816 591 1431 (443)466-0170 ? [employer], Disability and Transit Services: Assists with nutrition, care and transit needs. [place] Support Programs: @10RELATIVEDAYS @ > Cancer Support Group  2nd Tuesday of the month 1pm-2pm, Journey Room  > Creative Journey  3rd Tuesday of the month 1130am-1pm, Journey Room  > Look Good Feel Better  1st Wednesday of the month 10am-12 noon, Journey Room (Call [facility] to register [phone])

## 2016-12-03 ENCOUNTER — Encounter (HOSPITAL_COMMUNITY): Payer: Self-pay

## 2016-12-03 ENCOUNTER — Encounter (HOSPITAL_BASED_OUTPATIENT_CLINIC_OR_DEPARTMENT_OTHER): Payer: Medicare HMO

## 2016-12-03 VITALS — BP 165/56 | HR 62 | Temp 98.0°F | Resp 18 | Wt 128.0 lb

## 2016-12-03 DIAGNOSIS — Z5111 Encounter for antineoplastic chemotherapy: Secondary | ICD-10-CM

## 2016-12-03 DIAGNOSIS — G8929 Other chronic pain: Secondary | ICD-10-CM | POA: Diagnosis not present

## 2016-12-03 DIAGNOSIS — C787 Secondary malignant neoplasm of liver and intrahepatic bile duct: Secondary | ICD-10-CM | POA: Diagnosis not present

## 2016-12-03 DIAGNOSIS — R350 Frequency of micturition: Secondary | ICD-10-CM | POA: Diagnosis not present

## 2016-12-03 DIAGNOSIS — R69 Illness, unspecified: Secondary | ICD-10-CM | POA: Diagnosis not present

## 2016-12-03 DIAGNOSIS — M549 Dorsalgia, unspecified: Secondary | ICD-10-CM | POA: Diagnosis not present

## 2016-12-03 DIAGNOSIS — E876 Hypokalemia: Secondary | ICD-10-CM | POA: Diagnosis not present

## 2016-12-03 DIAGNOSIS — C259 Malignant neoplasm of pancreas, unspecified: Secondary | ICD-10-CM

## 2016-12-03 DIAGNOSIS — Z66 Do not resuscitate: Secondary | ICD-10-CM | POA: Diagnosis not present

## 2016-12-03 DIAGNOSIS — Z9221 Personal history of antineoplastic chemotherapy: Secondary | ICD-10-CM | POA: Diagnosis not present

## 2016-12-03 LAB — CBC WITH DIFFERENTIAL/PLATELET
BASOS ABS: 0 10*3/uL (ref 0.0–0.1)
Basophils Relative: 0 %
EOS ABS: 0.3 10*3/uL (ref 0.0–0.7)
EOS PCT: 4 %
HCT: 29.3 % — ABNORMAL LOW (ref 36.0–46.0)
HEMOGLOBIN: 9.8 g/dL — AB (ref 12.0–15.0)
LYMPHS ABS: 1.4 10*3/uL (ref 0.7–4.0)
Lymphocytes Relative: 24 %
MCH: 29.3 pg (ref 26.0–34.0)
MCHC: 33.4 g/dL (ref 30.0–36.0)
MCV: 87.7 fL (ref 78.0–100.0)
Monocytes Absolute: 0.6 10*3/uL (ref 0.1–1.0)
Monocytes Relative: 10 %
NEUTROS PCT: 62 %
Neutro Abs: 3.4 10*3/uL (ref 1.7–7.7)
PLATELETS: 149 10*3/uL — AB (ref 150–400)
RBC: 3.34 MIL/uL — AB (ref 3.87–5.11)
RDW: 18.1 % — ABNORMAL HIGH (ref 11.5–15.5)
WBC: 5.7 10*3/uL (ref 4.0–10.5)

## 2016-12-03 LAB — COMPREHENSIVE METABOLIC PANEL
ALBUMIN: 3.3 g/dL — AB (ref 3.5–5.0)
ALT: 16 U/L (ref 14–54)
AST: 22 U/L (ref 15–41)
Alkaline Phosphatase: 122 U/L (ref 38–126)
Anion gap: 8 (ref 5–15)
BUN: 21 mg/dL — AB (ref 6–20)
CHLORIDE: 103 mmol/L (ref 101–111)
CO2: 29 mmol/L (ref 22–32)
Calcium: 8.7 mg/dL — ABNORMAL LOW (ref 8.9–10.3)
Creatinine, Ser: 0.94 mg/dL (ref 0.44–1.00)
GFR calc Af Amer: >60 mL/min (ref >60)
GFR, EST NON AFRICAN AMERICAN: 53 mL/min — AB (ref >60)
Glucose, Bld: 87 mg/dL (ref 65–99)
POTASSIUM: 3.8 mmol/L (ref 3.5–5.1)
SODIUM: 140 mmol/L (ref 135–145)
Total Bilirubin: 0.7 mg/dL (ref 0.3–1.2)
Total Protein: 6.7 g/dL (ref 6.5–8.1)

## 2016-12-03 MED ORDER — SODIUM CHLORIDE 0.9 % IV SOLN
965.0000 mg/m2 | Freq: Once | INTRAVENOUS | Status: AC
  Administered 2016-12-03: 1596 mg via INTRAVENOUS
  Filled 2016-12-03: qty 26.22

## 2016-12-03 MED ORDER — SODIUM CHLORIDE 0.9 % IV SOLN
Freq: Once | INTRAVENOUS | Status: AC
  Administered 2016-12-03: 11:00:00 via INTRAVENOUS

## 2016-12-03 MED ORDER — PROCHLORPERAZINE MALEATE 10 MG PO TABS
10.0000 mg | ORAL_TABLET | Freq: Once | ORAL | Status: AC
  Administered 2016-12-03: 10 mg via ORAL
  Filled 2016-12-03: qty 1

## 2016-12-03 MED ORDER — HEPARIN SOD (PORK) LOCK FLUSH 100 UNIT/ML IV SOLN
500.0000 [IU] | Freq: Once | INTRAVENOUS | Status: AC | PRN
  Administered 2016-12-03: 500 [IU]

## 2016-12-03 MED ORDER — PACLITAXEL PROTEIN-BOUND CHEMO INJECTION 100 MG
125.0000 mg/m2 | Freq: Once | INTRAVENOUS | Status: AC
  Administered 2016-12-03: 200 mg via INTRAVENOUS
  Filled 2016-12-03: qty 40

## 2016-12-03 NOTE — Progress Notes (Signed)
Tolerated infusions w/o adverse reaction.  Alert, in no distress.  VSS.  Discharged ambulatory.  

## 2016-12-17 ENCOUNTER — Ambulatory Visit (HOSPITAL_COMMUNITY): Payer: Medicare HMO

## 2016-12-24 ENCOUNTER — Ambulatory Visit (HOSPITAL_COMMUNITY): Payer: Medicare HMO

## 2016-12-24 ENCOUNTER — Encounter: Payer: Self-pay | Admitting: Family Medicine

## 2016-12-24 ENCOUNTER — Encounter: Payer: Self-pay | Admitting: Hematology

## 2016-12-24 ENCOUNTER — Encounter (HOSPITAL_COMMUNITY): Payer: Self-pay

## 2016-12-24 ENCOUNTER — Encounter (HOSPITAL_COMMUNITY): Payer: Medicare HMO | Attending: Oncology

## 2016-12-24 ENCOUNTER — Ambulatory Visit (INDEPENDENT_AMBULATORY_CARE_PROVIDER_SITE_OTHER): Payer: Medicare HMO | Admitting: Family Medicine

## 2016-12-24 ENCOUNTER — Other Ambulatory Visit (HOSPITAL_COMMUNITY): Payer: Self-pay | Admitting: Adult Health

## 2016-12-24 VITALS — BP 146/70 | HR 68 | Temp 97.5°F | Resp 16 | Ht 64.0 in | Wt 126.5 lb

## 2016-12-24 VITALS — BP 177/65 | HR 60 | Temp 98.2°F | Resp 18 | Wt 126.7 lb

## 2016-12-24 DIAGNOSIS — C787 Secondary malignant neoplasm of liver and intrahepatic bile duct: Secondary | ICD-10-CM | POA: Diagnosis not present

## 2016-12-24 DIAGNOSIS — Z66 Do not resuscitate: Secondary | ICD-10-CM | POA: Insufficient documentation

## 2016-12-24 DIAGNOSIS — Z5111 Encounter for antineoplastic chemotherapy: Secondary | ICD-10-CM | POA: Diagnosis not present

## 2016-12-24 DIAGNOSIS — M549 Dorsalgia, unspecified: Secondary | ICD-10-CM | POA: Diagnosis not present

## 2016-12-24 DIAGNOSIS — G8929 Other chronic pain: Secondary | ICD-10-CM | POA: Insufficient documentation

## 2016-12-24 DIAGNOSIS — Z9889 Other specified postprocedural states: Secondary | ICD-10-CM | POA: Diagnosis not present

## 2016-12-24 DIAGNOSIS — Z9049 Acquired absence of other specified parts of digestive tract: Secondary | ICD-10-CM | POA: Insufficient documentation

## 2016-12-24 DIAGNOSIS — Z8719 Personal history of other diseases of the digestive system: Secondary | ICD-10-CM | POA: Insufficient documentation

## 2016-12-24 DIAGNOSIS — Z87891 Personal history of nicotine dependence: Secondary | ICD-10-CM | POA: Insufficient documentation

## 2016-12-24 DIAGNOSIS — R350 Frequency of micturition: Secondary | ICD-10-CM | POA: Diagnosis not present

## 2016-12-24 DIAGNOSIS — E876 Hypokalemia: Secondary | ICD-10-CM | POA: Insufficient documentation

## 2016-12-24 DIAGNOSIS — Z833 Family history of diabetes mellitus: Secondary | ICD-10-CM | POA: Insufficient documentation

## 2016-12-24 DIAGNOSIS — F419 Anxiety disorder, unspecified: Secondary | ICD-10-CM | POA: Insufficient documentation

## 2016-12-24 DIAGNOSIS — Z801 Family history of malignant neoplasm of trachea, bronchus and lung: Secondary | ICD-10-CM | POA: Diagnosis not present

## 2016-12-24 DIAGNOSIS — Z841 Family history of disorders of kidney and ureter: Secondary | ICD-10-CM | POA: Insufficient documentation

## 2016-12-24 DIAGNOSIS — Z87442 Personal history of urinary calculi: Secondary | ICD-10-CM | POA: Diagnosis not present

## 2016-12-24 DIAGNOSIS — C259 Malignant neoplasm of pancreas, unspecified: Secondary | ICD-10-CM

## 2016-12-24 DIAGNOSIS — M5440 Lumbago with sciatica, unspecified side: Secondary | ICD-10-CM | POA: Diagnosis not present

## 2016-12-24 DIAGNOSIS — Z808 Family history of malignant neoplasm of other organs or systems: Secondary | ICD-10-CM | POA: Diagnosis not present

## 2016-12-24 DIAGNOSIS — F329 Major depressive disorder, single episode, unspecified: Secondary | ICD-10-CM

## 2016-12-24 DIAGNOSIS — K219 Gastro-esophageal reflux disease without esophagitis: Secondary | ICD-10-CM | POA: Diagnosis not present

## 2016-12-24 DIAGNOSIS — R69 Illness, unspecified: Secondary | ICD-10-CM | POA: Diagnosis not present

## 2016-12-24 DIAGNOSIS — Z9221 Personal history of antineoplastic chemotherapy: Secondary | ICD-10-CM | POA: Diagnosis not present

## 2016-12-24 DIAGNOSIS — Z8249 Family history of ischemic heart disease and other diseases of the circulatory system: Secondary | ICD-10-CM | POA: Insufficient documentation

## 2016-12-24 DIAGNOSIS — I10 Essential (primary) hypertension: Secondary | ICD-10-CM

## 2016-12-24 LAB — COMPREHENSIVE METABOLIC PANEL
ALT: 14 U/L (ref 14–54)
ANION GAP: 9 (ref 5–15)
AST: 22 U/L (ref 15–41)
Albumin: 3.5 g/dL (ref 3.5–5.0)
Alkaline Phosphatase: 136 U/L — ABNORMAL HIGH (ref 38–126)
BUN: 20 mg/dL (ref 6–20)
CHLORIDE: 102 mmol/L (ref 101–111)
CO2: 28 mmol/L (ref 22–32)
Calcium: 9 mg/dL (ref 8.9–10.3)
Creatinine, Ser: 0.76 mg/dL (ref 0.44–1.00)
Glucose, Bld: 117 mg/dL — ABNORMAL HIGH (ref 65–99)
POTASSIUM: 3.7 mmol/L (ref 3.5–5.1)
Sodium: 139 mmol/L (ref 135–145)
Total Bilirubin: 0.5 mg/dL (ref 0.3–1.2)
Total Protein: 7.2 g/dL (ref 6.5–8.1)

## 2016-12-24 LAB — CBC WITH DIFFERENTIAL/PLATELET
BASOS ABS: 0 10*3/uL (ref 0.0–0.1)
BASOS PCT: 0 %
EOS PCT: 1 %
Eosinophils Absolute: 0 10*3/uL (ref 0.0–0.7)
HCT: 32.1 % — ABNORMAL LOW (ref 36.0–46.0)
Hemoglobin: 10.6 g/dL — ABNORMAL LOW (ref 12.0–15.0)
LYMPHS PCT: 17 %
Lymphs Abs: 1 10*3/uL (ref 0.7–4.0)
MCH: 29.5 pg (ref 26.0–34.0)
MCHC: 33 g/dL (ref 30.0–36.0)
MCV: 89.4 fL (ref 78.0–100.0)
MONO ABS: 0.2 10*3/uL (ref 0.1–1.0)
MONOS PCT: 4 %
Neutro Abs: 4.5 10*3/uL (ref 1.7–7.7)
Neutrophils Relative %: 78 %
PLATELETS: 230 10*3/uL (ref 150–400)
RBC: 3.59 MIL/uL — ABNORMAL LOW (ref 3.87–5.11)
RDW: 17.6 % — AB (ref 11.5–15.5)
WBC: 5.8 10*3/uL (ref 4.0–10.5)

## 2016-12-24 MED ORDER — TRAMADOL HCL 50 MG PO TABS: ORAL_TABLET | ORAL | 1 refills | Status: DC

## 2016-12-24 MED ORDER — HEPARIN SOD (PORK) LOCK FLUSH 100 UNIT/ML IV SOLN
500.0000 [IU] | Freq: Once | INTRAVENOUS | Status: AC | PRN
Start: 2016-12-24 — End: 2016-12-24
  Administered 2016-12-24: 500 [IU]
  Filled 2016-12-24: qty 5

## 2016-12-24 MED ORDER — SODIUM CHLORIDE 0.9 % IV SOLN
Freq: Once | INTRAVENOUS | Status: AC
  Administered 2016-12-24: 13:00:00 via INTRAVENOUS

## 2016-12-24 MED ORDER — PROCHLORPERAZINE MALEATE 10 MG PO TABS
ORAL_TABLET | ORAL | Status: AC
  Filled 2016-12-24: qty 1

## 2016-12-24 MED ORDER — SODIUM CHLORIDE 0.9 % IV SOLN
960.0000 mg/m2 | Freq: Once | INTRAVENOUS | Status: AC
  Administered 2016-12-24: 1596 mg via INTRAVENOUS
  Filled 2016-12-24: qty 26.3

## 2016-12-24 MED ORDER — MIRTAZAPINE 7.5 MG PO TABS: 7.5000 mg | ORAL_TABLET | Freq: Every day | ORAL | 2 refills | Status: DC

## 2016-12-24 MED ORDER — PROCHLORPERAZINE MALEATE 10 MG PO TABS
10.0000 mg | ORAL_TABLET | Freq: Once | ORAL | Status: AC
  Administered 2016-12-24: 10 mg via ORAL

## 2016-12-24 MED ORDER — ACETAMINOPHEN 500 MG PO TABS
ORAL_TABLET | ORAL | Status: AC
  Filled 2016-12-24: qty 1

## 2016-12-24 MED ORDER — CLONAZEPAM 0.5 MG PO TABS: ORAL_TABLET | ORAL | 2 refills | Status: DC

## 2016-12-24 MED ORDER — PACLITAXEL PROTEIN-BOUND CHEMO INJECTION 100 MG
125.0000 mg/m2 | Freq: Once | INTRAVENOUS | Status: AC
  Administered 2016-12-24: 200 mg via INTRAVENOUS
  Filled 2016-12-24: qty 40

## 2016-12-24 MED ORDER — SODIUM CHLORIDE 0.9% FLUSH
10.0000 mL | INTRAVENOUS | Status: DC | PRN
Start: 2016-12-24 — End: 2016-12-24
  Administered 2016-12-24: 10 mL
  Filled 2016-12-24: qty 10

## 2016-12-24 NOTE — Patient Instructions (Signed)
[facility] Discharge Instructions for Patients Receiving Chemotherapy   If you have a lab appointment with the [facility] please come in thru the  Main Entrance and check in at the main information desk   Today you received the following chemotherapy agents: Gemzar, Abraxane  To help prevent nausea and vomiting after your treatment, we encourage you to take your nausea medication as prescribed.   If you develop nausea and vomiting, or diarrhea that is not controlled by your medication, call the clinic.  The clinic phone number is [phone]. Office hours are Monday-Friday 8:30am-5:00pm.  BELOW ARE SYMPTOMS THAT SHOULD BE REPORTED IMMEDIATELY:  *FEVER GREATER THAN 101.0 F  *CHILLS WITH OR WITHOUT FEVER  NAUSEA AND VOMITING THAT IS NOT CONTROLLED WITH YOUR NAUSEA MEDICATION  *UNUSUAL SHORTNESS OF BREATH  *UNUSUAL BRUISING OR BLEEDING  TENDERNESS IN MOUTH AND THROAT WITH OR WITHOUT PRESENCE OF ULCERS  *URINARY PROBLEMS  *BOWEL PROBLEMS  UNUSUAL RASH Items with * indicate a potential emergency and should be followed up as soon as possible. If you have an emergency after office hours please contact your primary care physician or go to the nearest emergency department.  Please call the clinic during office hours if you have any questions or concerns.   You may also contact the Patient Navigator at [phone] should you have any questions or need assistance in obtaining follow up care.      Resources For Cancer Patients and their Caregivers ? American Cancer Society: Can assist with transportation, wigs, general needs, runs Look Good Feel Better.        364 317 5755 ? Cancer Care: Provides financial assistance, online support groups, medication/co-pay assistance.  1-800-813-HOPE 385-134-9014) ? [place] Assists [place] cancer patients and their families through emotional , educational and financial support.   212-396-0417 ? [place] DSS Where to apply for food stamps, Medicaid and utility assistance. 905-193-3490 ? [facility]ATS: Transportation to medical appointments. 608 771 2518 ? Social Security Administration: May apply for disability if have a Stage IV cancer. ([phone] 440-575-6326 ? [employer], Disability and Transit Services: Assists with nutrition, care and transit needs. 416-860-1854

## 2016-12-24 NOTE — Progress Notes (Signed)
Patient tolerated treatment today without incidence. Patient discharged ambulatory and in stable condition from clinic. Patient to follow up as scheduled.

## 2016-12-24 NOTE — Patient Instructions (Addendum)
F/u in 6 weeks, call if you need me sooner  New medication at bedtime for appetite and sleep  Reduce klonopin and tramadol to at most one daily Start having supper with sisters in the evening please  You are a champion, looking GREAT!

## 2016-12-25 LAB — CANCER ANTIGEN 19-9: CA 19-9: 219 U/mL — ABNORMAL HIGH (ref 0–35)

## 2016-12-27 ENCOUNTER — Encounter: Payer: Self-pay | Admitting: Family Medicine

## 2016-12-27 DIAGNOSIS — F329 Major depressive disorder, single episode, unspecified: Secondary | ICD-10-CM | POA: Insufficient documentation

## 2016-12-27 DIAGNOSIS — F32A Depression, unspecified: Secondary | ICD-10-CM | POA: Insufficient documentation

## 2016-12-27 NOTE — Assessment & Plan Note (Signed)
Sub optimal control, no change in treatment

## 2016-12-27 NOTE — Assessment & Plan Note (Signed)
Has been handling her diagnosis of pancreatic cancer for the past 2 plus years very bravely, states for the first time became depressed and "broke down"Poor appetite with weight loss, trial of remeron with 6 week re eval Excellent family support

## 2016-12-27 NOTE — Assessment & Plan Note (Signed)
Reduce klonopin to once daily, controlled

## 2016-12-27 NOTE — Progress Notes (Signed)
   [name]     MRN: [id number]      DOB: 1928/11/27   HPI [name] is here for follow up and re-evaluation of chronic medical conditions, medication management and review of any available recent lab and radiology data.  Preventive health is updated, specifically   Immunization.   Questions or concerns regarding consultations or procedures which the PT has had in the interim are  Addressed. States she had a melt  Down when she called her family crying telling them she looked bad and felt bad because of cancer, also has not been to church because feels as though she has lost too much weight C/o poor appetie and weight loss  ROS Denies recent fever or chills. Denies sinus pressure, nasal congestion, ear pain or sore throat. Denies chest congestion, productive cough or wheezing. Denies chest pains, palpitations , c/o  leg swelling at the endo of the day if she keeps her feet down for a long period Denies abdominal pain, nausea, vomiting,diarrhea or constipation.   Denies dysuria, frequency, hesitancy or incontinence. Denies uncontrolled joint pain, swelling and limitation in mobility.uses less pain medication than prescribed Denies headaches, seizures, numbness, or tingling.  Denies skin break down or rash.   PE  BP (!) 146/70   Pulse 68   Temp (!) 97.5 F (36.4 C) (Other (Comment))   Resp 16   Ht 5\' 4"  (1.626 m)   Wt 126 lb 8 oz (57.4 kg)   SpO2 98%   BMI 21.71 kg/m   Patient alert and oriented and in no cardiopulmonary distress.  HEENT: No facial asymmetry, EOMI,   oropharynx pink and moist.  Neck decreased though adequate ROM no JVD, no mass.  Chest: Clear to auscultation bilaterally.  CVS: S1, S2 no murmurs, no S3.Regular rate.  ABD: Soft non tender.   Ext: No edema  MS: Decreased  ROM spine, shoulders, hips and knees.  Skin: Intact, no ulcerations or rash noted.  Psych: Good eye contact, normal affect. Memory intact not anxious or depressed  appearing.  CNS: CN 2-12 intact, power,  normal throughout.no focal deficits noted.   Assessment & Plan Essential hypertension Sub optimal control, no change in treatment  Chronic low back pain with sciatica Reduce med dose to one daily tramadol which is what she I currently taking  Pancreatic cancer metastasized to liver [facility]) Receiving chemotherapy currently and repeat imaging study upcoming, pt has had recurrence of disease  Anxiety Reduce klonopin to once daily, controlled   Depression (emotion) Has been handling her diagnosis of pancreatic cancer for the past 2 plus years very bravely, states for the first time became depressed and "broke down"Poor appetite with weight loss, trial of remeron with 6 week re eval Excellent family support

## 2016-12-27 NOTE — Assessment & Plan Note (Signed)
Reduce med dose to one daily tramadol which is what she I currently taking

## 2016-12-27 NOTE — Assessment & Plan Note (Signed)
Receiving chemotherapy currently and repeat imaging study upcoming, pt has had recurrence of disease

## 2016-12-31 ENCOUNTER — Encounter (HOSPITAL_COMMUNITY): Payer: Self-pay

## 2016-12-31 ENCOUNTER — Encounter (HOSPITAL_COMMUNITY): Payer: Medicare HMO | Attending: Oncology

## 2016-12-31 DIAGNOSIS — C787 Secondary malignant neoplasm of liver and intrahepatic bile duct: Secondary | ICD-10-CM | POA: Insufficient documentation

## 2016-12-31 DIAGNOSIS — C259 Malignant neoplasm of pancreas, unspecified: Secondary | ICD-10-CM | POA: Insufficient documentation

## 2016-12-31 LAB — CBC WITH DIFFERENTIAL/PLATELET
BASOS ABS: 0 10*3/uL (ref 0.0–0.1)
BASOS PCT: 0 %
EOS ABS: 0 10*3/uL (ref 0.0–0.7)
Eosinophils Relative: 1 %
HEMATOCRIT: 26.9 % — AB (ref 36.0–46.0)
HEMOGLOBIN: 9.2 g/dL — AB (ref 12.0–15.0)
Lymphocytes Relative: 40 %
Lymphs Abs: 1.2 10*3/uL (ref 0.7–4.0)
MCH: 30.4 pg (ref 26.0–34.0)
MCHC: 34.2 g/dL (ref 30.0–36.0)
MCV: 88.8 fL (ref 78.0–100.0)
Monocytes Absolute: 0.2 10*3/uL (ref 0.1–1.0)
Monocytes Relative: 8 %
NEUTROS ABS: 1.5 10*3/uL — AB (ref 1.7–7.7)
NEUTROS PCT: 50 %
Platelets: 84 10*3/uL — ABNORMAL LOW (ref 150–400)
RBC: 3.03 MIL/uL — AB (ref 3.87–5.11)
RDW: 16.3 % — ABNORMAL HIGH (ref 11.5–15.5)
WBC: 3 10*3/uL — AB (ref 4.0–10.5)

## 2016-12-31 LAB — COMPREHENSIVE METABOLIC PANEL
ALT: 15 U/L (ref 14–54)
AST: 22 U/L (ref 15–41)
Albumin: 3.1 g/dL — ABNORMAL LOW (ref 3.5–5.0)
Alkaline Phosphatase: 109 U/L (ref 38–126)
Anion gap: 7 (ref 5–15)
BUN: 21 mg/dL — ABNORMAL HIGH (ref 6–20)
CHLORIDE: 106 mmol/L (ref 101–111)
CO2: 27 mmol/L (ref 22–32)
CREATININE: 0.93 mg/dL (ref 0.44–1.00)
Calcium: 8.5 mg/dL — ABNORMAL LOW (ref 8.9–10.3)
GFR, EST NON AFRICAN AMERICAN: 54 mL/min — AB (ref >60)
Glucose, Bld: 95 mg/dL (ref 65–99)
POTASSIUM: 4 mmol/L (ref 3.5–5.1)
Sodium: 140 mmol/L (ref 135–145)
TOTAL PROTEIN: 6.7 g/dL (ref 6.5–8.1)
Total Bilirubin: 0.5 mg/dL (ref 0.3–1.2)

## 2016-12-31 MED ORDER — HEPARIN SOD (PORK) LOCK FLUSH 100 UNIT/ML IV SOLN
500.0000 [IU] | Freq: Once | INTRAVENOUS | Status: AC
  Administered 2016-12-31: 500 [IU] via INTRAVENOUS

## 2016-12-31 MED ORDER — HEPARIN SOD (PORK) LOCK FLUSH 100 UNIT/ML IV SOLN
INTRAVENOUS | Status: AC
  Filled 2016-12-31: qty 5

## 2016-12-31 NOTE — Patient Instructions (Signed)
[place] at [facility] Discharge Instructions  RECOMMENDATIONS MADE BY THE CONSULTANT AND ANY TEST RESULTS WILL BE SENT TO YOUR REFERRING PHYSICIAN.  Chemotherapy held today. Follow up next week  Thank you for choosing [place] at [facility] to provide your oncology and hematology care.  To afford each patient quality time with our provider, please arrive at least 15 minutes before your scheduled appointment time.    If you have a lab appointment with the [facility] please come in thru the  Main Entrance and check in at the main information desk  You need to re-schedule your appointment should you arrive 10 or more minutes late.  We strive to give you quality time with our providers, and arriving late affects you and other patients whose appointments are after yours.  Also, if you no show three or more times for appointments you may be dismissed from the clinic at the providers discretion.     Again, thank you for choosing [facility].  Our hope is that these requests will decrease the amount of time that you wait before being seen by our physicians.       _____________________________________________________________  Should you have questions after your visit to [facility], please contact our office at [phone] between the hours of 8:30 a.m. and 4:30 p.m.  Voicemails left after 4:30 p.m. will not be returned until the following business day.  For prescription refill requests, have your pharmacy contact our office.       Resources For Cancer Patients and their Caregivers ? American Cancer Society: Can assist with transportation, wigs, general needs, runs Look Good Feel Better.        [phone] ? Cancer Care: Provides financial assistance, online support groups, medication/co-pay assistance.  1-800-813-HOPE (858)105-5826) ? [place] Assists [place] cancer patients and  their families through emotional , educational and financial support.  682-831-5632 ? [place] DSS Where to apply for food stamps, Medicaid and utility assistance. (951)455-0807 ? RCATS: Transportation to medical appointments. 380-858-9338 ? Social Security Administration: May apply for disability if have a Stage IV cancer. 502-209-2937 (340) 127-3467 ? [employer], Disability and Transit Services: Assists with nutrition, care and transit needs. [place] Support Programs: @10RELATIVEDAYS @ > Cancer Support Group  2nd Tuesday of the month 1pm-2pm, Journey Room  > Creative Journey  3rd Tuesday of the month 1130am-1pm, Journey Room  > Look Good Feel Better  1st Wednesday of the month 10am-12 noon, Journey Room (Call [facility] to register [phone])

## 2016-12-31 NOTE — Progress Notes (Signed)
Labs reviewed with MD. Platelets 84, Neutraphil 1.5. Patient has not been feeling good. Will hold treatment till for a week.follow up as scheduled.

## 2017-01-06 ENCOUNTER — Other Ambulatory Visit (HOSPITAL_COMMUNITY): Payer: Self-pay | Admitting: Oncology

## 2017-01-06 ENCOUNTER — Other Ambulatory Visit: Payer: Self-pay | Admitting: Family Medicine

## 2017-01-06 ENCOUNTER — Telehealth: Payer: Self-pay | Admitting: Family Medicine

## 2017-01-06 MED ORDER — HYDROCHLOROTHIAZIDE 12.5 MG PO CAPS: 12.5000 mg | ORAL_CAPSULE | Freq: Two times a day (BID) | ORAL | 4 refills | Status: DC

## 2017-01-06 MED ORDER — POTASSIUM CHLORIDE ER 10 MEQ PO TBCR: 10.0000 meq | EXTENDED_RELEASE_TABLET | Freq: Two times a day (BID) | ORAL | 4 refills | Status: DC

## 2017-01-06 NOTE — Telephone Encounter (Signed)
She has hCTZ 12.5 mg one daily on file , pls advise her to take TWO HCTZ 12.5 mg tablets daily and come for nurse BP check next week Wednesday  Needs to take potassium  10 meq one twice daily on hCTZ will send that in

## 2017-01-06 NOTE — Telephone Encounter (Signed)
Patient left message on nurse line. She states that when she went to get up this morning, she couldn't. She states her head was 'going round and round and round,' so she had her son check her blood pressure. It was 183/69 in one arm and 168/77 in the other arm. She asks that Dr. Moshe Cipro please send her in a new blood pressure medication, and to call her once it is sent in.

## 2017-01-06 NOTE — Telephone Encounter (Signed)
Patient informed of message below, verbalized understanding.  

## 2017-01-06 NOTE — Progress Notes (Unsigned)
Potassium

## 2017-01-07 ENCOUNTER — Encounter (HOSPITAL_COMMUNITY): Payer: Self-pay

## 2017-01-07 ENCOUNTER — Encounter (HOSPITAL_BASED_OUTPATIENT_CLINIC_OR_DEPARTMENT_OTHER): Payer: Medicare HMO

## 2017-01-07 ENCOUNTER — Telehealth (HOSPITAL_COMMUNITY): Payer: Self-pay

## 2017-01-07 VITALS — BP 154/57 | HR 58 | Temp 98.2°F | Resp 18 | Wt 128.0 lb

## 2017-01-07 DIAGNOSIS — Z91041 Radiographic dye allergy status: Secondary | ICD-10-CM

## 2017-01-07 DIAGNOSIS — C259 Malignant neoplasm of pancreas, unspecified: Secondary | ICD-10-CM

## 2017-01-07 DIAGNOSIS — Z5111 Encounter for antineoplastic chemotherapy: Secondary | ICD-10-CM | POA: Diagnosis not present

## 2017-01-07 DIAGNOSIS — M549 Dorsalgia, unspecified: Secondary | ICD-10-CM | POA: Diagnosis not present

## 2017-01-07 DIAGNOSIS — C787 Secondary malignant neoplasm of liver and intrahepatic bile duct: Secondary | ICD-10-CM | POA: Diagnosis not present

## 2017-01-07 DIAGNOSIS — R350 Frequency of micturition: Secondary | ICD-10-CM | POA: Diagnosis not present

## 2017-01-07 DIAGNOSIS — E876 Hypokalemia: Secondary | ICD-10-CM | POA: Diagnosis not present

## 2017-01-07 DIAGNOSIS — Z66 Do not resuscitate: Secondary | ICD-10-CM | POA: Diagnosis not present

## 2017-01-07 DIAGNOSIS — G8929 Other chronic pain: Secondary | ICD-10-CM | POA: Diagnosis not present

## 2017-01-07 DIAGNOSIS — Z9221 Personal history of antineoplastic chemotherapy: Secondary | ICD-10-CM | POA: Diagnosis not present

## 2017-01-07 DIAGNOSIS — R69 Illness, unspecified: Secondary | ICD-10-CM | POA: Diagnosis not present

## 2017-01-07 LAB — COMPREHENSIVE METABOLIC PANEL
ALBUMIN: 3.4 g/dL — AB (ref 3.5–5.0)
ALK PHOS: 125 U/L (ref 38–126)
ALT: 14 U/L (ref 14–54)
AST: 19 U/L (ref 15–41)
Anion gap: 9 (ref 5–15)
BILIRUBIN TOTAL: 0.4 mg/dL (ref 0.3–1.2)
BUN: 19 mg/dL (ref 6–20)
CO2: 27 mmol/L (ref 22–32)
Calcium: 8.8 mg/dL — ABNORMAL LOW (ref 8.9–10.3)
Chloride: 102 mmol/L (ref 101–111)
Creatinine, Ser: 0.9 mg/dL (ref 0.44–1.00)
GFR calc Af Amer: >60 mL/min (ref >60)
GFR calc non Af Amer: 56 mL/min — ABNORMAL LOW (ref >60)
GLUCOSE: 104 mg/dL — AB (ref 65–99)
POTASSIUM: 4 mmol/L (ref 3.5–5.1)
Sodium: 138 mmol/L (ref 135–145)
TOTAL PROTEIN: 7.2 g/dL (ref 6.5–8.1)

## 2017-01-07 LAB — CBC WITH DIFFERENTIAL/PLATELET
BASOS ABS: 0 10*3/uL (ref 0.0–0.1)
BASOS PCT: 0 %
Eosinophils Absolute: 0.3 10*3/uL (ref 0.0–0.7)
Eosinophils Relative: 6 %
HEMATOCRIT: 29.2 % — AB (ref 36.0–46.0)
HEMOGLOBIN: 9.7 g/dL — AB (ref 12.0–15.0)
Lymphocytes Relative: 30 %
Lymphs Abs: 1.7 10*3/uL (ref 0.7–4.0)
MCH: 29.8 pg (ref 26.0–34.0)
MCHC: 33.2 g/dL (ref 30.0–36.0)
MCV: 89.6 fL (ref 78.0–100.0)
Monocytes Absolute: 0.5 10*3/uL (ref 0.1–1.0)
Monocytes Relative: 10 %
NEUTROS ABS: 3 10*3/uL (ref 1.7–7.7)
NEUTROS PCT: 54 %
Platelets: 141 10*3/uL — ABNORMAL LOW (ref 150–400)
RBC: 3.26 MIL/uL — ABNORMAL LOW (ref 3.87–5.11)
RDW: 17 % — AB (ref 11.5–15.5)
WBC: 5.5 10*3/uL (ref 4.0–10.5)

## 2017-01-07 MED ORDER — DIPHENHYDRAMINE HCL 50 MG PO TABS: ORAL_TABLET | ORAL | 0 refills | Status: DC

## 2017-01-07 MED ORDER — PREDNISONE 50 MG PO TABS: ORAL_TABLET | ORAL | 0 refills | Status: DC

## 2017-01-07 MED ORDER — SODIUM CHLORIDE 0.9% FLUSH
10.0000 mL | INTRAVENOUS | Status: DC | PRN
  Administered 2017-01-07: 10 mL
  Filled 2017-01-07: qty 10

## 2017-01-07 MED ORDER — SODIUM CHLORIDE 0.9 % IV SOLN
Freq: Once | INTRAVENOUS | Status: AC
  Administered 2017-01-07: 11:00:00 via INTRAVENOUS

## 2017-01-07 MED ORDER — PROCHLORPERAZINE MALEATE 10 MG PO TABS
ORAL_TABLET | ORAL | Status: AC
  Filled 2017-01-07: qty 1

## 2017-01-07 MED ORDER — PROCHLORPERAZINE MALEATE 10 MG PO TABS
10.0000 mg | ORAL_TABLET | Freq: Once | ORAL | Status: AC
  Administered 2017-01-07: 10 mg via ORAL

## 2017-01-07 MED ORDER — HEPARIN SOD (PORK) LOCK FLUSH 100 UNIT/ML IV SOLN
500.0000 [IU] | Freq: Once | INTRAVENOUS | Status: AC | PRN
  Administered 2017-01-07: 500 [IU]

## 2017-01-07 MED ORDER — PACLITAXEL PROTEIN-BOUND CHEMO INJECTION 100 MG
125.0000 mg/m2 | Freq: Once | INTRAVENOUS | Status: AC
  Administered 2017-01-07: 200 mg via INTRAVENOUS
  Filled 2017-01-07: qty 40

## 2017-01-07 MED ORDER — SODIUM CHLORIDE 0.9 % IV SOLN
965.0000 mg/m2 | Freq: Once | INTRAVENOUS | Status: AC
  Administered 2017-01-07: 1596 mg via INTRAVENOUS
  Filled 2017-01-07: qty 15.76

## 2017-01-07 NOTE — Telephone Encounter (Signed)
Patient has documented allergy to Iv contrast. Premedication regimen of prednisone and benadryl sent to patients pharmacy. Attempted to call patient and notify her but was unable to reach at either number. Will continue to try.

## 2017-01-07 NOTE — Progress Notes (Signed)
Patient tolerated chemotherapy with no complaints voiced.  Port site clean and dry with no bruising or swelling noted at site.  Band aid applied.  VSS with discharge.  Left ambulatory with family.

## 2017-01-07 NOTE — Patient Instructions (Signed)
[facility] Discharge Instructions for Patients Receiving Chemotherapy  Today you received the following chemotherapy agents gemzar and abraxane.  Call for any problems or questions.  Keep scheduled appointments.   To help prevent nausea and vomiting after your treatment, we encourage you to take your nausea medication.    If you develop nausea and vomiting that is not controlled by your nausea medication, call the clinic.   BELOW ARE SYMPTOMS THAT SHOULD BE REPORTED IMMEDIATELY:  *FEVER GREATER THAN 100.5 F  *CHILLS WITH OR WITHOUT FEVER  NAUSEA AND VOMITING THAT IS NOT CONTROLLED WITH YOUR NAUSEA MEDICATION  *UNUSUAL SHORTNESS OF BREATH  *UNUSUAL BRUISING OR BLEEDING  TENDERNESS IN MOUTH AND THROAT WITH OR WITHOUT PRESENCE OF ULCERS  *URINARY PROBLEMS  *BOWEL PROBLEMS  UNUSUAL RASH Items with * indicate a potential emergency and should be followed up as soon as possible.  Feel free to call the clinic you have any questions or concerns. The clinic phone number is [phone].  Please show the [facility] at check-in to the Emergency Department and triage nurse.

## 2017-01-08 ENCOUNTER — Ambulatory Visit (INDEPENDENT_AMBULATORY_CARE_PROVIDER_SITE_OTHER): Payer: Medicare HMO | Admitting: Family Medicine

## 2017-01-08 ENCOUNTER — Telehealth (HOSPITAL_COMMUNITY): Payer: Self-pay | Admitting: *Deleted

## 2017-01-08 ENCOUNTER — Encounter: Payer: Self-pay | Admitting: Family Medicine

## 2017-01-08 VITALS — BP 132/60 | HR 62 | Temp 98.6°F | Resp 14 | Ht 64.0 in | Wt 129.0 lb

## 2017-01-08 DIAGNOSIS — R42 Dizziness and giddiness: Secondary | ICD-10-CM | POA: Diagnosis not present

## 2017-01-08 DIAGNOSIS — R69 Illness, unspecified: Secondary | ICD-10-CM | POA: Diagnosis not present

## 2017-01-08 DIAGNOSIS — T451X5A Adverse effect of antineoplastic and immunosuppressive drugs, initial encounter: Secondary | ICD-10-CM

## 2017-01-08 DIAGNOSIS — F3289 Other specified depressive episodes: Secondary | ICD-10-CM | POA: Diagnosis not present

## 2017-01-08 DIAGNOSIS — G620 Drug-induced polyneuropathy: Secondary | ICD-10-CM | POA: Diagnosis not present

## 2017-01-08 DIAGNOSIS — I10 Essential (primary) hypertension: Secondary | ICD-10-CM

## 2017-01-08 DIAGNOSIS — D696 Thrombocytopenia, unspecified: Secondary | ICD-10-CM | POA: Diagnosis not present

## 2017-01-08 MED ORDER — METOPROLOL TARTRATE 25 MG PO TABS: 25.0000 mg | ORAL_TABLET | Freq: Two times a day (BID) | ORAL | 3 refills | Status: DC

## 2017-01-08 MED ORDER — MECLIZINE HCL 12.5 MG PO TABS: 12.5000 mg | ORAL_TABLET | Freq: Three times a day (TID) | ORAL | 0 refills | Status: DC | PRN

## 2017-01-08 NOTE — Patient Instructions (Addendum)
F/u as before , call if you need me sooner  You are treated for inner ear, use the antivert/ mecllizine, shortest ime needed,and only for symptom of dizziness Be careful as the medication makes you sleepy   Thankful that blood pressure is good and appertite has improved Vertigo Vertigo means that you feel like you are moving when you are not. Vertigo can also make you feel like things around you are moving when they are not. This feeling can come and go at any time. Vertigo often goes away on its own. Follow these instructions at home:  Avoid making fast movements.  Avoid driving.  Avoid using heavy machinery.  Avoid doing any task or activity that might cause danger to you or other people if you would have a vertigo attack while you are doing it.  Sit down right away if you feel dizzy or have trouble with your balance.  Take over-the-counter and prescription medicines only as told by your doctor.  Follow instructions from your doctor about which positions or movements you should avoid.  Drink enough fluid to keep your pee (urine) clear or pale yellow.  Keep all follow-up visits as told by your doctor. This is important. Contact a doctor if:  Medicine does not help your vertigo.  You have a fever.  Your problems get worse or you have new symptoms.  Your family or friends see changes in your behavior.  You feel sick to your stomach (nauseous) or you throw up (vomit).  You have a "pins and needles" feeling or you are numb in part of your body. Get help right away if:  You have trouble moving or talking.  You are always dizzy.  You pass out (faint).  You get very bad headaches.  You feel weak or have trouble using your hands, arms, or legs.  You have changes in your hearing.  You have changes in your seeing (vision).  You get a stiff neck.  Bright light starts to bother you. This information is not intended to replace advice given to you by your health care  provider. Make sure you discuss any questions you have with your health care provider. Document Released: 02/05/2008 Document Revised: 10/04/2015 Document Reviewed: 08/21/2014 Elsevier Interactive Patient Education  2018 [employer].  Vertigo Vertigo means that you feel like you are moving when you are not. Vertigo can also make you feel like things around you are moving when they are not. This feeling can come and go at any time. Vertigo often goes away on its own. Follow these instructions at home:  Avoid making fast movements.  Avoid driving.  Avoid using heavy machinery.  Avoid doing any task or activity that might cause danger to you or other people if you would have a vertigo attack while you are doing it.  Sit down right away if you feel dizzy or have trouble with your balance.  Take over-the-counter and prescription medicines only as told by your doctor.  Follow instructions from your doctor about which positions or movements you should avoid.  Drink enough fluid to keep your pee (urine) clear or pale yellow.  Keep all follow-up visits as told by your doctor. This is important. Contact a doctor if:  Medicine does not help your vertigo.  You have a fever.  Your problems get worse or you have new symptoms.  Your family or friends see changes in your behavior.  You feel sick to your stomach (nauseous) or you throw up (vomit).  You have a "pins and needles" feeling or you are numb in part of your body. Get help right away if:  You have trouble moving or talking.  You are always dizzy.  You pass out (faint).  You get very bad headaches.  You feel weak or have trouble using your hands, arms, or legs.  You have changes in your hearing.  You have changes in your seeing (vision).  You get a stiff neck.  Bright light starts to bother you. This information is not intended to replace advice given to you by your health care provider. Make sure you discuss any  questions you have with your health care provider. Document Released: 02/05/2008 Document Revised: 10/04/2015 Document Reviewed: 08/21/2014 Elsevier Interactive Patient Education  [employer].

## 2017-01-08 NOTE — Telephone Encounter (Signed)
Notified patients sister about the premedication for the CT tomorrow. She states patients son is gone to pick up prescription now. Explained to patients sister how to take the medicine. She verbalized understanding and states she will tell her sister (the patient) the instructions.

## 2017-01-09 ENCOUNTER — Ambulatory Visit (HOSPITAL_COMMUNITY)
Admission: RE | Admit: 2017-01-09 | Discharge: 2017-01-09 | Disposition: A | Discharge status: Home / Self Care | Payer: Medicare HMO | Source: Ambulatory Visit | Attending: Adult Health | Admitting: Adult Health

## 2017-01-09 DIAGNOSIS — D7389 Other diseases of spleen: Secondary | ICD-10-CM | POA: Insufficient documentation

## 2017-01-09 DIAGNOSIS — C259 Malignant neoplasm of pancreas, unspecified: Secondary | ICD-10-CM | POA: Insufficient documentation

## 2017-01-09 DIAGNOSIS — K769 Liver disease, unspecified: Secondary | ICD-10-CM | POA: Insufficient documentation

## 2017-01-09 DIAGNOSIS — C787 Secondary malignant neoplasm of liver and intrahepatic bile duct: Secondary | ICD-10-CM | POA: Diagnosis not present

## 2017-01-09 MED ORDER — IOPAMIDOL (ISOVUE-300) INJECTION 61%
100.0000 mL | Freq: Once | INTRAVENOUS | Status: AC | PRN
  Administered 2017-01-09: 100 mL via INTRAVENOUS

## 2017-01-09 MED ORDER — HEPARIN SOD (PORK) LOCK FLUSH 100 UNIT/ML IV SOLN
INTRAVENOUS | Status: AC
  Administered 2017-01-09: 500 [IU]
  Filled 2017-01-09: qty 5

## 2017-01-10 ENCOUNTER — Encounter: Payer: Self-pay | Admitting: Family Medicine

## 2017-01-10 DIAGNOSIS — T451X5A Adverse effect of antineoplastic and immunosuppressive drugs, initial encounter: Secondary | ICD-10-CM

## 2017-01-10 DIAGNOSIS — G620 Drug-induced polyneuropathy: Secondary | ICD-10-CM | POA: Insufficient documentation

## 2017-01-10 DIAGNOSIS — D696 Thrombocytopenia, unspecified: Secondary | ICD-10-CM | POA: Insufficient documentation

## 2017-01-10 NOTE — Assessment & Plan Note (Signed)
Two day history, patient education done , she is accompanied by her son. Antivert prescribed and use explained, tapered doses  For shortest time needed

## 2017-01-10 NOTE — Assessment & Plan Note (Signed)
Improved on remeron and patient reports weight gain which she is happy about she will continue the medication and keep follow up

## 2017-01-10 NOTE — Progress Notes (Signed)
   [name]     MRN: [id number]      DOB: [birth year]   HPI [name] is here with a 2 dayh/o "head spinning" This is her first episode. Does report that 2 weeks ago she had upper respiratory / viral type illness. Had called in with concerns of elevated blood pressure 2 days ago and after review of her medical record  I did agee toincreasing her med dose, however , instead of HCTZ she doubled up the beta blocker, today both blood pressure and HR are good so she will be maintained on this  ROS  Denies current sinus pressure, nasal congestion, ear pain or sore throat. Denies chest congestion, productive cough or wheezing. Denies chest pains, palpitations and leg swelling Denies abdominal pain, nausea, vomiting,diarrhea or constipation.   Denies dysuria, frequency, hesitancy or incontinence. Denies uncontrolled  joint pain, swelling and limitation in mobility. Denies headaches, seizures, numbness, or tingling. Denies depression, anxiety or insomnia. Denies skin break down or rash.   PE  BP 132/60 (BP Location: Left Arm, Patient Position: Sitting, Cuff Size: Normal)   Pulse 62   Temp 98.6 F (37 C)   Resp 14   Ht 5\' 4"  (1.626 m)   Wt 129 lb (58.5 kg)   SpO2 98%   BMI 22.14 kg/m   Patient alert and oriented and in no cardiopulmonary distress.  HEENT: No facial asymmetry, EOMI,   oropharynx pink and moist.  Neck supple no JVD, no mass.no nystagmus  Chest: Clear to auscultation bilaterally.  CVS: S1, S2 no murmurs, no S3.Regular rate.  ABD: Soft non tender.   Ext: No edema  MS: Adequate ROM spine, shoulders, hips and knees.  Skin: Intact, no ulcerations or rash noted.  Psych: Good eye contact, normal affect. Memory intact not anxious or depressed appearing.  CNS: CN 2-12 intact, power,  normal throughout.no focal deficits noted.   Assessment & Plan  Vertigo Two day history, patient education done , she is accompanied by her son. Antivert prescribed and use  explained, tapered doses  For shortest time needed  Essential hypertension Controlled , no medication change  Depression (emotion) Improved on remeron and patient reports weight gain which she is happy about she will continue the medication and keep follow up

## 2017-01-10 NOTE — Assessment & Plan Note (Signed)
Controlled , no medication change 

## 2017-01-14 ENCOUNTER — Encounter (HOSPITAL_COMMUNITY): Payer: Self-pay | Admitting: Adult Health

## 2017-01-14 ENCOUNTER — Encounter (HOSPITAL_COMMUNITY): Payer: Medicare HMO | Attending: Oncology | Admitting: Adult Health

## 2017-01-14 VITALS — BP 147/54 | HR 60 | Temp 98.5°F | Resp 18 | Wt 129.0 lb

## 2017-01-14 DIAGNOSIS — Z8249 Family history of ischemic heart disease and other diseases of the circulatory system: Secondary | ICD-10-CM | POA: Insufficient documentation

## 2017-01-14 DIAGNOSIS — Z9889 Other specified postprocedural states: Secondary | ICD-10-CM | POA: Insufficient documentation

## 2017-01-14 DIAGNOSIS — Z7189 Other specified counseling: Secondary | ICD-10-CM

## 2017-01-14 DIAGNOSIS — Z9049 Acquired absence of other specified parts of digestive tract: Secondary | ICD-10-CM | POA: Insufficient documentation

## 2017-01-14 DIAGNOSIS — C259 Malignant neoplasm of pancreas, unspecified: Secondary | ICD-10-CM | POA: Diagnosis not present

## 2017-01-14 DIAGNOSIS — Z808 Family history of malignant neoplasm of other organs or systems: Secondary | ICD-10-CM | POA: Insufficient documentation

## 2017-01-14 DIAGNOSIS — I10 Essential (primary) hypertension: Secondary | ICD-10-CM | POA: Insufficient documentation

## 2017-01-14 DIAGNOSIS — Z87442 Personal history of urinary calculi: Secondary | ICD-10-CM | POA: Insufficient documentation

## 2017-01-14 DIAGNOSIS — G8929 Other chronic pain: Secondary | ICD-10-CM | POA: Insufficient documentation

## 2017-01-14 DIAGNOSIS — Z833 Family history of diabetes mellitus: Secondary | ICD-10-CM | POA: Insufficient documentation

## 2017-01-14 DIAGNOSIS — C787 Secondary malignant neoplasm of liver and intrahepatic bile duct: Secondary | ICD-10-CM

## 2017-01-14 DIAGNOSIS — F419 Anxiety disorder, unspecified: Secondary | ICD-10-CM | POA: Insufficient documentation

## 2017-01-14 DIAGNOSIS — M549 Dorsalgia, unspecified: Secondary | ICD-10-CM | POA: Insufficient documentation

## 2017-01-14 DIAGNOSIS — K219 Gastro-esophageal reflux disease without esophagitis: Secondary | ICD-10-CM | POA: Insufficient documentation

## 2017-01-14 DIAGNOSIS — F329 Major depressive disorder, single episode, unspecified: Secondary | ICD-10-CM | POA: Insufficient documentation

## 2017-01-14 DIAGNOSIS — Z9221 Personal history of antineoplastic chemotherapy: Secondary | ICD-10-CM | POA: Insufficient documentation

## 2017-01-14 DIAGNOSIS — E876 Hypokalemia: Secondary | ICD-10-CM | POA: Insufficient documentation

## 2017-01-14 DIAGNOSIS — Z87891 Personal history of nicotine dependence: Secondary | ICD-10-CM | POA: Insufficient documentation

## 2017-01-14 DIAGNOSIS — Z841 Family history of disorders of kidney and ureter: Secondary | ICD-10-CM | POA: Insufficient documentation

## 2017-01-14 DIAGNOSIS — R350 Frequency of micturition: Secondary | ICD-10-CM | POA: Insufficient documentation

## 2017-01-14 DIAGNOSIS — Z8719 Personal history of other diseases of the digestive system: Secondary | ICD-10-CM | POA: Insufficient documentation

## 2017-01-14 DIAGNOSIS — Z66 Do not resuscitate: Secondary | ICD-10-CM | POA: Insufficient documentation

## 2017-01-14 DIAGNOSIS — Z801 Family history of malignant neoplasm of trachea, bronchus and lung: Secondary | ICD-10-CM | POA: Insufficient documentation

## 2017-01-14 MED ORDER — ERLOTINIB HCL 100 MG PO TABS: 100.0000 mg | ORAL_TABLET | Freq: Every day | ORAL | 5 refills | Status: DC

## 2017-01-14 NOTE — Patient Instructions (Signed)
[facility] at [facility] Discharge Instructions  RECOMMENDATIONS MADE BY THE CONSULTANT AND ANY TEST RESULTS WILL BE SENT TO YOUR REFERRING PHYSICIAN.  You saw Gretchen Dawson, NP, today See Amy at checkout for appointments.   Thank you for choosing [facility] at [facility] to provide your oncology and hematology care.  To afford each patient quality time with our provider, please arrive at least 15 minutes before your scheduled appointment time.    If you have a lab appointment with the Cancer Center please come in thru the  Main Entrance and check in at the main information desk  You need to re-schedule your appointment should you arrive 10 or more minutes late.  We strive to give you quality time with our providers, and arriving late affects you and other patients whose appointments are after yours.  Also, if you no show three or more times for appointments you may be dismissed from the clinic at the providers discretion.     Again, thank you for choosing [facility].  Our hope is that these requests will decrease the amount of time that you wait before being seen by our physicians.       _____________________________________________________________  Should you have questions after your visit to [facility], please contact our office at [phone] between the hours of 8:30 a.m. and 4:30 p.m.  Voicemails left after 4:30 p.m. will not be returned until the following business day.  For prescription refill requests, have your pharmacy contact our office.       Resources For Cancer Patients and their Caregivers ? American Cancer Society: Can assist with transportation, wigs, general needs, runs Look Good Feel Better.        1-888-227-6333 ? Cancer Care: Provides financial assistance, online support groups, medication/co-pay assistance.  1-800-813-HOPE (4673) ? [facility] Assists  [place] cancer patients and their families through emotional , educational and financial support.  336-427-4357 ? [place] DSS Where to apply for food stamps, Medicaid and utility assistance. 336-342-1394 ? RCATS: Transportation to medical appointments. 336-347-2287 ? Social Security Administration: May apply for disability if have a Stage IV cancer. 336-342-7796 1-800-772-1213 ? [place] [employer], Disability and Transit Services: Assists with nutrition, care and transit needs. 336-349-2343  Cancer Center Support Programs: @10RELATIVEDAYS@ > Cancer Support Group  2nd Tuesday of the month 1pm-2pm, Journey Room  > Creative Journey  3rd Tuesday of the month 1130am-1pm, Journey Room  > Look Good Feel Better  1st Wednesday of the month 10am-12 noon, Journey Room (Call [employer] to register [phone])    

## 2017-01-14 NOTE — Progress Notes (Signed)
[place], [place] [address]   CLINIC:  Medical Oncology/Hematology  PCP:  Fayrene Helper, MD [address] [place] [address] [phone]   REASON FOR VISIT:  Follow-up for Stage IV pancreatic cancer with liver mets   CURRENT THERAPY: Gemzar/Abraxane, resumed 09/25/16   BRIEF ONCOLOGIC HISTORY:    Pancreatic cancer metastasized to liver ([place])   03/10/2014 Initial Diagnosis    Pancreatic cancer metastasized to liver      03/22/2014 - 03/05/2016 Chemotherapy    Abraxane/Gemzar days 1, 8, every 28 days.  Day 15 was cancelled due to leukopenia and thrombocytopenia on day 15 cycle 1.      05/24/2014 Treatment Plan Change    Day 8 of cycle 3 is held with ANC of 1.1      06/05/2014 Imaging    CT C/A/P . Interval decrease in size of the pancreatic tail mass.Improved hepatic metastatic disease. No new lesions. No CT findings for metastatic disease involving the chest.      08/09/2014 Tumor Marker    CA 19-9= 33 (WNL)      10/26/2014 Imaging    MRI- Continued interval decrease in size of the hepatic metastatic lesions and no new lesions are identified. Continued decrease in size of the pancreatic tail lesion.      01/03/2015 Tumor Marker    Results for SHAWNEEN, DEETZ (MRN [id number]) as of 01/18/2015 12:52  01/03/2015 10:00 CA 19-9: 14       01/17/2015 Imaging    MRI- Response to therapy of hepatic metastasis.  Similar size of a pancreatic tail lesion.      03/20/2015 Imaging    MRI-L spine- Severe disc space narrowing at L2-L3, with endplate reactive changes. Large disc extrusion into the ventral epidural space,central to the RIGHT with a cephalad migrated free fragment. Additional Large disc extrusion into the retroperitone...      03/22/2015 Imaging    CT pelvis- No evidence for metastatic disease within the pelvis.      07/24/2015 Imaging    MRI abd- Continued response to therapy, with no residual detectable  liver metastases. No new sites of metastatic disease in the abdomen.       08/15/2015 Code Status     She confirms desire for DNR status.      12/21/2015 Imaging    MRI liver- Severe image degradation due to motion artifact reducing diagnostic sensitivity and specificity. Reduce conspicuity of the pancreatic tail lesion suggesting further improvement. The original liver lesions have resolved.      03/05/2016 Treatment Plan Change    Chemotherapy holiday/Break after 2 years worth of treatment      04/07/2016 Imaging    CT chest- When compared to recent chest CT, new minimally displaced anterior left sixth rib fracture. Slight increase in subcarinal adenopathy      04/28/2016 Imaging    Bone density- BMD as determined from Femur Neck Right is 0.705 g/cm2 with a T-Score of -2.4. This patient is considered osteopenic according to [facility]) criteria. Compared with the prior study on 02/23/2013, the BMD of the lumbar spine/rt. femoral neck show a statistically significant decrease.       05/23/2016 Imaging    MRI abd- 1. Exam is significantly degraded by patient respiratory motion. Consider follow-up exams with CT abdomen with without contrast per pancreatic protocol. 2. Fullness in tail of pancreas is more prominent and potentially increased in size. Recommend close attention  on follow-up. (Consider CT as above) 3. No explanation for back pain.      09/11/2016 Imaging    MRI pancreas- Interval progression of pancreatic tail lesion. New differential perfusion right liver with areas of heterogeneity. Underlying metastatic disease in this region not excluded.      09/11/2016 Progression    MRi in conjunction with rising CA 19-9 are indicative of relapse of disease.        INTERVAL HISTORY:  [name] 83 y.o. female returns for routine follow-up for metastatic pancreatic cancer.  She is here today with her son, who lives in [place].  Overall, [name]  tells me that she has been feeling "pretty good." Appetite 100%; energy levels 50%. Denies any pain.  She feels tired at times; she has some diarrhea, "but mostly just after my treatments."  She did have an episode of vertigo/dizziness that lasted for ~2-3 days. She was given "a little blue pill" for it, which was helpful (appears to have been Meclizine).  She has had no residual/recurrent symptoms since that time.  She has chronic joint and feet swelling, which is no worse than her normal.  She had an episode of N&V "after I had to drink all that stuff for my CT scan."  No other episodes of N&V.  Denies any urinary complaints.   She also reports a "knot" to her right shoulder that has been there for the past week or so. States that it was initially painful "because it's where my bra strap hits it", but is not painful/tender now.  No associated skin changes or drainage from this area.  She recently had CT scans and would like to review both the results and images together today.     REVIEW OF SYSTEMS:  Review of Systems  Constitutional: Positive for fatigue. Negative for chills and fever.  HENT:  Negative.   Eyes: Negative.   Respiratory: Negative.   Cardiovascular: Positive for leg swelling.  Gastrointestinal: Positive for diarrhea, nausea and vomiting. Negative for abdominal pain, blood in stool and constipation.  Endocrine: Negative.   Genitourinary: Negative.  Negative for dysuria and hematuria.   Musculoskeletal: Positive for arthralgias.       (R) shoulder "knot"   Skin: Negative.  Negative for rash.  Neurological: Positive for dizziness.  Hematological: Negative.   Psychiatric/Behavioral: Negative.      PAST MEDICAL/SURGICAL HISTORY:  Past Medical History:  Diagnosis Date  . Anemia due to antineoplastic chemotherapy 09/12/2015   Started Aranesp 500 mcg on 09/12/2015  . Anxiety   . Chronic diarrhea   . Depression   . DNR (do not resuscitate) 08/16/2015  . Erosive esophagitis   .  GERD (gastroesophageal reflux disease)   . Hx of adenomatous colonic polyps    tubular adenomas, last found in 2008  . Hyperplastic colon polyp 03/19/10   tcs by Dr. Gala Romney  . Hypertension 20 years   . Kidney stone    hx/ crushed   . Opioid contract exists 04/18/2015   With Dr. Moshe Cipro  . Pancreatic cancer ([place]) 02/2014  . Pancreatic cancer metastasized to liver ([place]) 03/10/2014  . Schatzki's ring 08/26/10   Last dilated on EGD by Dr. Trevor Iha [facility], linear gastric erosions, BI hemigastrectomy   Past Surgical History:  Procedure Laterality Date  . BREAST LUMPECTOMY Left   . bunion removal     from both feet   . CHOLECYSTECTOMY  1965   . COLONOSCOPY  03/19/2010   DR Gala Romney,, normal TI, pancolonic  diverticula, random colon bx neg., hyperplastic polyps removed  . ESOPHAGOGASTRODUODENOSCOPY  11/22/2003   DR Gala Romney, erosive RE, Billroth I  . ESOPHAGOGASTRODUODENOSCOPY  08/26/10   Dr. Gala Romney- moderate severe ERE, Scahtzki ring s/p dilation, Billroth I, linear gastric erosions, bx-gastric xanthelasma  . LEFT SHOULDER SURGERY  2009   DR HARRISON  . ORIF ANKLE FRACTURE Right 05/22/2013   Procedure: OPEN REDUCTION INTERNAL FIXATION (ORIF) RIGHT ANKLE FRACTURE;  Surgeon: Sanjuana Kava, MD;  Location: [facility];  Service: Orthopedics;  Laterality: Right;  . stomach ulcer  50 years ago    had some of her stomach removed      SOCIAL HISTORY:  Social History   Social History  . Marital status: Widowed    Spouse name: N/A  . Number of children: 2  . Years of education: N/A   Occupational History  . retired from Teacher, adult education Retired   Social History Main Topics  . Smoking status: Current Some Day Smoker    Packs/day: 0.50    Years: 60.00    Types: Cigarettes    Last attempt to quit: 09/09/2016  . Smokeless tobacco: Never Used  . Alcohol use No  . Drug use: No  . Sexual activity: No   Other Topics Concern  . Not on file   Social History Narrative  . No narrative on file    FAMILY HISTORY:   Family History  Problem Relation Age of Onset  . Hypertension Mother   . Kidney failure Brother        on dialysis  . Diabetes Sister   . Diabetes Sister   . Hypertension Father   . Kidney failure Sister   . Kidney Stones Brother   . Breast cancer Son   . Gout Son   . Liver disease Neg Hx   . Colon cancer Neg Hx     CURRENT MEDICATIONS:  Outpatient Encounter Prescriptions as of 01/14/2017  Medication Sig Note  . acetaminophen (TYLENOL) 500 MG tablet Take 500 mg by mouth every 6 (six) hours as needed for mild pain or moderate pain.    Marland Kitchen amLODipine (NORVASC) 10 MG tablet TAKE 1 TABLET BY MOUTH EVERY DAY   . Calcium-Phosphorus-Vitamin D [phone] MG-MG-UNIT CHEW Chew 2 tablets by mouth daily.   . clonazePAM (KLONOPIN) 0.5 MG tablet One tablet once daily as needed for anxiety   . cycloSPORINE (RESTASIS) 0.05 % ophthalmic emulsion Place 1 drop into both eyes 2 (two) times daily.   . diphenhydrAMINE (BENADRYL) 50 MG tablet Take 50 mg (1 tab) one hour prior to scan (10 am on 8/31)   . ferrous sulfate 325 (65 FE) MG tablet Take 325 mg by mouth daily with breakfast.   . hydrochlorothiazide (MICROZIDE) 12.5 MG capsule    . lidocaine-prilocaine (EMLA) cream APPLY A QUARTER SIZE AMOUNT TO PORT SITE 1 HOURS PRIOR TO CHEMO DO NOT RUB IN COVER WITH PLASTIC WRAP 10/05/2016: Received treatment on 10/02/2016  . meclizine (ANTIVERT) 12.5 MG tablet Take 1 tablet (12.5 mg total) by mouth 3 (three) times daily as needed for dizziness.   . metoprolol tartrate (LOPRESSOR) 25 MG tablet Take 1 tablet (25 mg total) by mouth 2 (two) times daily.   . mirtazapine (REMERON) 7.5 MG tablet Take 1 tablet (7.5 mg total) by mouth at bedtime.   . Multiple Vitamins-Minerals (EQ ONE DAILY WOMENS 50+) TABS Take 1 tablet by mouth daily.   . ondansetron (ZOFRAN) 8 MG tablet Take 1 tablet (8 mg total) by mouth 2 (two)  times daily as needed (Nausea or vomiting).   . pantoprazole (PROTONIX) 20 MG tablet TAKE 1 TABLET(20 MG) BY  MOUTH DAILY   . potassium chloride (K-DUR) 10 MEQ tablet Take 1 tablet (10 mEq total) by mouth 2 (two) times daily.   . prochlorperazine (COMPAZINE) 10 MG tablet Take 1 tablet (10 mg total) by mouth every 6 (six) hours as needed (Nausea or vomiting).   . traMADol (ULTRAM) 50 MG tablet One tablet once daily as needed for uncontrolled  pain   . erlotinib (TARCEVA) 100 MG tablet Take 1 tablet (100 mg total) by mouth daily. Take on an empty stomach 1 hour before meals or 2 hours after   . [DISCONTINUED] predniSONE (DELTASONE) 50 MG tablet Take 50 mg (1 Tab) 13 hours prior to scan (9 pm on 8/30), 7 hours prior to scan (4 am on 8/31), and 1 hour prior to scan (10 am on 8/31) (Patient not taking: Reported on 01/08/2017)    Facility-Administered Encounter Medications as of 01/14/2017  Medication  . dexamethasone (DECADRON) 10 mg in sodium chloride 0.9 % 50 mL IVPB    ALLERGIES:  Allergies  Allergen Reactions  . Iohexol Hives and Shortness Of Breath    PATIENT HAD TO BE TAKEN TO ED, C/O WHELPS, HIVES, DIFFICULTY BREATHING. PATIENT GIVEN IV CONTRAST AFTER 13 HOUR PRE MEDS ON 01/09/2017 WITH NO REACTION NOTED  . Aciphex [Rabeprazole Sodium] Other (See Comments)    unknown  . Amlodipine Besylate-Valsartan     Rash to high-dose (5/320)  . Esomeprazole Magnesium   . Omeprazole Other (See Comments)    Patient states "medication didn't work"  . Ciprofloxacin Rash  . Penicillins Swelling and Rash    Has patient had a PCN reaction causing immediate rash, facial/tongue/throat swelling, SOB or lightheadedness with hypotension: Yes Has patient had a PCN reaction causing severe rash involving mucus membranes or skin necrosis: Yes Has patient had a PCN reaction that required hospitalization: No Has patient had a PCN reaction occurring within the last 10 years: yes If all of the above answers are "NO", then may proceed with Cephalosporin use.      PHYSICAL EXAM:  ECOG Performance status: 1-2 - Symptomatic,  but largely independent; may require occasional assistance.   Vitals:   01/14/17 0846  BP: (!) 147/54  Pulse: 60  Resp: 18  Temp: 98.5 F (36.9 C)  SpO2: 100%   Filed Weights   01/14/17 0846  Weight: 129 lb (58.5 kg)      Physical Exam  Constitutional: She is oriented to person, place, and time.  Frail appearing female in no acute distress   HENT:  Head: Normocephalic.  Mouth/Throat: Oropharynx is clear and moist. No oropharyngeal exudate.  Eyes: Pupils are equal, round, and reactive to light. Conjunctivae are normal. No scleral icterus.  Neck: Normal range of motion. Neck supple.  Cardiovascular: Normal rate and regular rhythm.   Pulmonary/Chest: Effort normal. No respiratory distress. She has wheezes (Mild expiratory wheezes throughout ).  Abdominal: Soft. Bowel sounds are normal. She exhibits no distension. There is no tenderness. There is no rebound.  Musculoskeletal: Normal range of motion. She exhibits edema.  (R) shoulder near acromion/humerus region with ~1.5 cm cystic slightly mobile lesion. No pain/tenderness on palpation. No ROM deficit.   Lymphadenopathy:    She has no cervical adenopathy.  Neurological: She is alert and oriented to person, place, and time. No cranial nerve deficit. Gait normal.  Skin: Skin is warm and dry. No rash noted.  Psychiatric: Mood, memory, affect and judgment normal.  Nursing note and vitals reviewed.    LABORATORY DATA:  I have reviewed the labs as listed.  CBC    Component Value Date/Time   WBC 5.5 01/07/2017 1036   RBC 3.26 (L) 01/07/2017 1036   HGB 9.7 (L) 01/07/2017 1036   HCT 29.2 (L) 01/07/2017 1036   PLT 141 (L) 01/07/2017 1036   MCV 89.6 01/07/2017 1036   MCH 29.8 01/07/2017 1036   MCHC 33.2 01/07/2017 1036   RDW 17.0 (H) 01/07/2017 1036   LYMPHSABS 1.7 01/07/2017 1036   MONOABS 0.5 01/07/2017 1036   EOSABS 0.3 01/07/2017 1036   BASOSABS 0.0 01/07/2017 1036   CMP Latest Ref Rng & Units 01/07/2017 12/31/2016  12/24/2016  Glucose 65 - 99 mg/dL 104(H) 95 117(H)  BUN 6 - 20 mg/dL 19 21(H) 20  Creatinine 0.44 - 1.00 mg/dL 0.90 0.93 0.76  Sodium 135 - 145 mmol/L 138 140 139  Potassium 3.5 - 5.1 mmol/L 4.0 4.0 3.7  Chloride 101 - 111 mmol/L 102 106 102  CO2 22 - 32 mmol/L '27 27 28  ' Calcium 8.9 - 10.3 mg/dL 8.8(L) 8.5(L) 9.0  Total Protein 6.5 - 8.1 g/dL 7.2 6.7 7.2  Total Bilirubin 0.3 - 1.2 mg/dL 0.4 0.5 0.5  Alkaline Phos 38 - 126 U/L 125 109 136(H)  AST 15 - 41 U/L '19 22 22  ' ALT 14 - 54 U/L '14 15 14      ' PENDING LABS:    DIAGNOSTIC IMAGING:  CT chest/abd/pelvis: 01/09/17 *Images and radiologic reports reviewed independently and I agree with below findings.  CLINICAL DATA:  Pancreatic cancer with suspected liver metastases  EXAM: CT CHEST, ABDOMEN, AND PELVIS WITH CONTRAST  TECHNIQUE: Multidetector CT imaging of the chest, abdomen and pelvis was performed following the standard protocol during bolus administration of intravenous contrast.  CONTRAST:  153m ISOVUE-300 IOPAMIDOL (ISOVUE-300) INJECTION 61%  COMPARISON:  MRI abdomen dated 09/11/2016. CT chest dated 04/07/2016. CT pelvis dated 03/22/2015.  FINDINGS: CT CHEST FINDINGS  Cardiovascular: Heart is normal in size.  No pericardial effusion.  No evidence of thoracic aortic aneurysm. Atherosclerotic calcifications of the aortic arch.  Three vessel coronary atherosclerosis.  Right chest port terminates at the cavoatrial junction.  Mediastinum/Nodes: Calcified low right paratracheal node (series 3/image 38), within normal limits.  No suspicious mediastinal, hilar, or axillary lymphadenopathy.  Visualized thyroid is enlarged/ heterogeneous with bilateral thyroid nodules measuring up to 13 mm on the right (series 3/ image 9).  Lungs/Pleura: 2 mm subpleural nodule in the left lower lobe (series 8/ image 41), likely benign.  No suspicious pulmonary nodules.  Mild centrilobular and paraseptal  emphysematous changes, upper lobe predominant.  No focal consolidation.  Mild biapical pleural-parenchymal scarring.  No pleural effusion or pneumothorax.  Musculoskeletal: Mild degenerative changes of the thoracic spine.  CT ABDOMEN PELVIS FINDINGS  Hepatobiliary: Multiple scattered hypoenhancing lesions measuring less than 10 mm in the liver (for example, series 3/images 76, 87, 102, and 103), approximately 8-10 in number. These are suspicious for metastatic disease.  Status post cholecystectomy. Mild intrahepatic and extrahepatic ductal dilatation. Dilated common duct, measuring 12 mm, although smoothly tapering at the ampulla.  Pancreas: 2.1 x 2.9 cm heterogeneously enhancing lesion in the pancreatic tail abutting the splenic hilum (series 9/ image 58), corresponding to known primary pancreatic neoplasm. Although difficult to directly compare to prior MRI, this previously measured 1.7 x 2.6 cm.  Spleen: Heterogeneous perfusion of the spleen. 2.5 cm fluid density lesion  in the central spleen (series 3/image 98).  Adrenals/Urinary Tract: Adrenal glands are within normal limits.  Numerous bilateral renal cysts of varying sizes and complexities, including a 2.5 cm hyperdense/hemorrhagic cyst along the posterior right upper kidney (series 3/image 90), benign (Bosniak II). Most of the cysts are simple (Bosniak I). No enhancing renal lesions. No hydronephrosis.  Bladder is mildly thick-walled although underdistended.  Stomach/Bowel: Stomach is within normal limits.  No evidence of bowel obstruction.  Appendix is not discretely visualized.  Mild left colonic stool burden.  Vascular/Lymphatic: No evidence of abdominal aortic aneurysm.  Atherosclerotic calcifications of the abdominal aorta and branch vessels.  No suspicious abdominopelvic lymphadenopathy.  Reproductive: Uterus is unremarkable.  Bilateral ovaries are within normal  limits.  Other: No abdominopelvic ascites.  Musculoskeletal: Degenerative changes of the lumbar spine, most prominent at L2-3.  IMPRESSION: 2.1 x 2.9 cm lesion in the pancreatic tail and abutting the splenic hilum, corresponding to known primary pancreatic neoplasm, mildly increased.  Scattered small hypoenhancing lesions in the liver measuring up to 10 mm, approximately 8-10 in number, suspicious for hepatic metastases.  No findings specific for metastatic disease in the chest.  Additional ancillary findings as above.   Electronically Signed   By: Julian Hy M.D.   On: 01/09/2017 15:09       PATHOLOGY:  Liver biopsy: 03/09/14           ASSESSMENT & PLAN:   Stage IV pancreatic cancer with liver mets:  -Metastatic disease noted 03/10/14 with biopsy confirmed liver mets. Received chemotherapy with Gemzar/Abraxane on days 1 & 8 every 28 day cycle; CA 19-9 normalized and subsequent imaging revealed response and later no residual detectable liver mets in 07/24/15. Chemotherapy drug holiday started in 02/2016, after 2 years of chemotherapy. MRI on 09/11/16 indicates progression of pancreatic tail lesion & right liver with concern for recurrent metastatic disease. CA 19-9 was rising as well. -Recent restaging CT chest/abd/pelvis revealed mildly increased lesion in pancreatic tail, as well as scattered small liver lesions, approximately 8-10 in number, suspicious for worsening liver mets. No findings for metastatic disease in the chest.   -Discussed with patient that given recent progression of disease on imaging, we need to consider starting a different treatment. This could include Gemzar on days 1 & 8 (day 15 to be omitted given previous cytopenias with previous chemotherapy) + Tarceva 100 mg daily.  Or, she could possibly qualify for Keytruda if she is MSI-High on Foundation One testing.  I cannot see where she has had Foundation One testing in the past. Will  contact [employer] and order this today per patient's request.  In the meantime, we will start Gemzar/Tarceva. We discussed about possible side effects of treatment, and she was given written patient education info on Tarceva today as well.  Paper prescription for Tarceva written by Dr. Talbert Cage and given to Durene Cal, our patient advocate to help facilitate patient getting oral chemo delivered.  -Gave instructions for patient to contact us when she receives the Tarceva in the mail, so our nurse navigator can discuss with her in more detail as well.  She agreed.   -Return in ~2 weeks for next cycle of Gemzar.  -Return to cancer center in ~5 weeks for follow-up visit to assess tolerance to new chemo regimen.    Goals of care:  -Ms. Amico remains very clear in her wish for DNR/DNI; outpatient DNR maintained.   -We spent some time today discussing her goals of care in  terms of treatment for her metastatic pancreatic cancer.  She understands that her cancer is treatable, but not curable. We discussed that while she has treatment options now, there will likely come a time in the future when there are no additional treatment choices for her, whether it be d/t disease progression or her overall performance status/adverse effects of treatment.  We discussed that proceeding with no active treatment and initiating more palliative care/hospice is reasonable as well.  She voiced understanding and wishes to proceed with additional treatment for now, which is certainly reasonable.   -Discussed the importance of completing a living will and/or healthcare power of attorney. They were given paperwork today; encouraged them to complete the paperwork, but not to sign it as it has to be witnessed and notarized to become a legal document.     ? (R) shoulder cyst:  -There is mildly mobile lesion to her right acromion/humerus region, measuring about 1.5 cm. Was tender, now tenderness resolved.  Will continue to  monitor for now; no additional work-up necessary at this time. Encouraged patient to let us know if it changed in the coming days to weeks.        Dispo:  -Foundation One ordered today on liver biopsy path from 2015.  -Return to cancer center in ~2 weeks for next cycle of Gemzar. Instructed her to call us when she has received the Tarceva via mail, so that we can coordinate her care.  -Return to cancer center for follow-up in ~5 weeks to assess toxicity of new treatment regimen.    All questions were answered to patient's stated satisfaction. Encouraged patient to call with any new concerns or questions before her next visit to the cancer center and we can certain see her sooner, if needed.    Plan of care discussed with Dr. Talbert Cage, who agrees with the above aforementioned.    A total of 30 minutes was spent in face-to-face care of this patient, with greater than 50% of that time spent in counseling and care-coordination.    Orders placed this encounter:  No orders of the defined types were placed in this encounter.     Mike Craze, NP [place] [phone]

## 2017-01-19 ENCOUNTER — Telehealth (HOSPITAL_COMMUNITY): Payer: Self-pay | Admitting: Emergency Medicine

## 2017-01-19 ENCOUNTER — Encounter (HOSPITAL_COMMUNITY): Payer: Medicare HMO

## 2017-01-19 NOTE — Telephone Encounter (Signed)
Called to see if pt had delivery of medication set up yet.  Pt states that no one has contacted her yet.  About 30 minutes later pt calls me back and states that someone called her last week and told her the co-pay would be 43 dollars and something and they would be delivering the medication today.  She said she would call RN back when she received the medication.    Chemotherapy teaching pulled together.

## 2017-01-19 NOTE — Patient Instructions (Signed)
[place]   CHEMOTHERAPY INSTRUCTIONS  You have Stage 4 Pancreatic Cancer.  We are going to treat you with tarceva and gemzar. You will take tarceva daily on a empty stomach.  Gemzar will be given IV on days 1 and days 8 every 28 days.  This means 2 weeks in a row with a 2 week break.  This treatment is with palliative intent, which means you are treatable but not curable.  You will see the doctor regularly throughout treatment.  We monitor your lab work prior to every treatment.  The doctor monitors your response to treatment by the way you are feeling, your blood work, and scans periodically.  There will be wait times while you are here for treatment.  It will take about 30 minutes to 1 hour for your lab work to result.  Then there will be wait times while pharmacy mixes your medications.     POTENTIAL SIDE EFFECTS OF TREATMENT:  Erlotinib (Tarceva)  About This Drug Erlotinib is used to treat cancer. It is given orally (by mouth).  Possible Side Effects . Loose bowel movements (diarrhea) . Nausea and throwing up (vomiting) . Decreased appetite (decreased hunger) . Tiredness . Cough . Trouble breathing . Rash . Changes in your liver function Note: Each of the side effects above was reported in 20% or greater of patients treated with erlotinib. Not all possible side effects are included above.  Warnings and Precautions . Inflammation (swelling) of the lungs, which can be life-threatening. You may have a dry cough or trouble breathing. . Changes in your kidney function which can cause kidney failure and be life-threatening . Severe changes in your liver function which can cause liver failure and be life-threatening . Perforation- a hole in your stomach, small and/or large intestine which can be life-threatening . Severe skin reactions. You may get a rash with fluid-filled bumps/blisters and/or a red skin rash which sometimes can be weeping  (peeling off). Maryelizabeth Kaufmann of your red blood cells which is very rare and can cause anemia (decreased red blood cells) . Increased risk of a stroke in patients with pancreatic cancer. Symptoms of a stroke such as sudden numbness or weakness of your face, arm, or leg, especially on one side of your body; sudden confusion, trouble speaking or understanding; sudden trouble seeing in one or both eyes; sudden trouble walking, feeling dizzy, loss of balance or coordination; or sudden bad headache with no known cause. If you have any of these symptoms for 2 minutes, call 911. . Eye irritation, increased tears, sensitivity to light and other changes in eyesight. . Erlotinib may interact with blood thinning medicine such as warfarin, which may increase your risk of bleeding. symptoms may be coughing up blood, throwing up blood (may look like coffee grounds), red or black tarry bowel movements, abnormally heavy menstrual flow, nosebleeds or any other unusual bleeding. Note: Some of the side effects above are very rare. If you have concerns and/or questions, please discuss them with your medical team.  Important Information . Talk to your doctor and/or nurse if you smoke or have any changes in your smoking habit while taking erlotinib as this may lower the levels of the drug in your body, which can make it less effective. It is highly recommended to quit smoking while taking erlotinib.  How to Take Your Medication . Take this drug by mouth without food, at least 1 hour before you eat or 2 hours after you  eat. . If you take an antacid such as Pepcid, Tagamet, and Zantac, take erlotinib at least 2 hours before or 10 hours after you take the antacid. Antacids such as Maalox and Mylanta should be taken before or after erlotinib by several hours. Contact your physician for specific instructions. . Missed dose: If you vomit or miss a dose, contact your physician for further instructions. Do not take 2  doses at the same time and do not double up on the next dose. Marland Kitchen Handling: Wash your hands after handling your medicine, your caretakers should not handle your medicine with bare hands and should wear latex gloves. . This drug may be present in the saliva, tears, sweat, urine, stool, vomit, semen, and vaginal secretions. Talk to your doctor and/or your nurse about the necessary precautions to take during this time. . Storage: Store this medicine in the original container at room temperature. Discuss with your nurse or your doctor how to dispose of unused medicine.  Treating Side Effects . Manage tiredness by pacing your activities for the day. Be sure to include periods of rest between energy-draining activities. . Drink plenty of fluids (a minimum of eight glasses per day is recommended). . If you throw up or have loose bowel movements, you should drink more fluids so that you do not become dehydrated (lack water in the body from losing too much fluid). . If you get diarrhea, eat low-fiber foods that are high in protein and calories and avoid foods that can irritate your digestive tracts or lead to cramping. . Ask your nurse or doctor about medicine that can lessen or stop your diarrhea. . To help with nausea and vomiting, eat small, frequent meals instead of three large meals a day. Choose foods and drinks that are at room temperature. Ask your nurse or doctor about other helpful tips and medicine that is available to help or stop lessen these symptoms. . To help with decreased appetite, eat small, frequent meals. . Eat high caloric food such as pudding, ice cream, yogurt and milkshakes. . If you get a rash do not put anything on it unless your doctor or nurse says you may. Keep the area around the rash clean and dry. Ask your doctor for medicine if your rash bothers you. . Use sunscreen with SPF 30 or higher when you are outdoors even for a short time. Cover up when you are out in the  sun. Wear wide-brimmed hats, long-sleeved shirts, and pants. Keep your neck, chest, and back covered.  Food and Drug Interactions . Do not eat grapefruit or drink grapefruit juice while taking this medicine. Grapefruit and grapefruit juice may raise the levels of erlotinib in your body. This could make side effects worse. . Check with your doctor or pharmacist about all other prescription medicines and dietary supplements you are taking before starting this medicine as there are known drug interactions with erlotinib. Also, check with your doctor or pharmacist before starting any new prescription or overthe- counter medicines, or dietary supplement to make sure that there are no interactions. . Avoid the use of St. John's Wort and cigarette smoking while taking erlotinib as this may lower the levels of the drug in your body, which can make it less effective . Drugs that treat heartburn and stomach upset such Maalox, Mylanta, Protonix, Nexium, Prilosec, Pepcid, Tagamet, and Zantac may lower the effect of your cancer treatment if taken with erlotinib. Call your doctor to find out what drug you may take with  erlotinib to help with heartburn or stomach upset. . There are known interactions of erlotinib with blood thinning medicine such as warfarin. Ask your doctor what precautions you should take.  When to Call the Doctor Call your doctor or nurse if you have any of these symptoms and/or any new or unusual symptoms: . Fever of 100.5 F (38 C) or higher . Chills . Blurred vision or other changes in eyesight . Red or painful eye . Sensitivity to light . Pain in your chest . Dry cough or coughing up yellow, green, or bloody mucus . Wheezing or trouble breathing . Nausea that stops you from eating or drinking and/or is not relieved by prescribed medicines . Throwing up more than 3 times a day . Loose bowel movements (diarrhea) 4 times a day or loose bowel movements with lack of  strength or a feeling of being dizzy . Lasting loss of appetite or rapid weight loss of five pounds in a week . Fatigue that interferes with your daily activities . Severe Abdominal pain that does not go away . Easy bleeding or bruising . Blood in your urine, vomit (bright red or coffee-ground) and/or stools ( bright red, or black/tarry) . Coughing up blood . Decreased urine, or very dark urine . Signs of possible liver problems: dark urine, pale bowel movements, bad stomach pain, feeling very tired and weak, unusual itching, or yellowing of the eyes or skin . New rash and/or itching . Rash that is not relieved by prescribed medicines . Symptoms of a stroke such as sudden numbness or weakness of your face, arm, or leg, especially on one side of your body; sudden confusion, trouble speaking or understanding; sudden trouble seeing in one or both eyes; sudden trouble walking, feeling dizzy, loss of balance or coordination; or sudden bad headache with no known cause. If you have any of these symptoms for 2 minutes, call 911. . If you think you may be pregnant  Reproduction Warnings . Pregnancy warning: This drug can have harmful effects on the unborn baby. Women of childbearing potential should use effective methods of birth control during your cancer treatment and for 1 month after treatment. Let your doctor know right away if you think you may be pregnant. . Breastfeeding warning: Women should not breastfeed during treatment and for 2 weeks after treatment because this drug could enter the breast milk and cause harm to a breastfeeding baby. . Fertility warning: Human fertility studies have not been done with this drug. Talk with your doctor or nurse if you plan to have children. Ask for information on sperm or egg banking.    SELF CARE ACTIVITIES WHILE ON CHEMOTHERAPY: Hydration Increase your fluid intake 48 hours prior to treatment and drink at least 8 to 12 cups (64 ounces) of  water/decaff beverages per day after treatment. You can still have your cup of coffee or soda but these beverages do not count as part of your 8 to 12 cups that you need to drink daily. No alcohol intake.  Medications Continue taking your normal prescription medication as prescribed.  If you start any new herbal or new supplements please let us know first to make sure it is safe.  Mouth Care Have teeth cleaned professionally before starting treatment. Keep dentures and partial plates clean. Use soft toothbrush and do not use mouthwashes that contain alcohol. Biotene is a good mouthwash that is available at most pharmacies or may be ordered by calling [phone]. Use warm salt water gargles (  1 teaspoon salt per 1 quart warm water) before and after meals and at bedtime. Or you may rinse with 2 tablespoons of three-percent hydrogen peroxide mixed in eight ounces of water. If you are still having problems with your mouth or sores in your mouth please call the clinic. If you need dental work, please let the doctor know before you go for your appointment so that we can coordinate the best possible time for you in regards to your chemo regimen. You need to also let your dentist know that you are actively taking chemo. We may need to do labs prior to your dental appointment.   Skin Care Always use sunscreen that has not expired and with SPF (Sun Protection Factor) of 50 or higher. Wear hats to protect your head from the sun. Remember to use sunscreen on your hands, ears, face, & feet.  Use good moisturizing lotions such as udder cream, eucerin, or even Vaseline. Some chemotherapies can cause dry skin, color changes in your skin and nails.    . Avoid long, hot showers or baths. . Use gentle, fragrance-free soaps and laundry detergent. . Use moisturizers, preferably creams or ointments rather than lotions because the thicker consistency is better at preventing skin dehydration. Apply the cream or ointment  within 15 minutes of showering. Reapply moisturizer at night, and moisturize your hands every time after you wash them.  Hair Loss (if your doctor says your hair will fall out)  . If your doctor says that your hair is likely to fall out, decide before you begin chemo whether you want to wear a wig. You may want to shop before treatment to match your hair color. . Hats, turbans, and scarves can also camouflage hair loss, although some people prefer to leave their heads uncovered. If you go bare-headed outdoors, be sure to use sunscreen on your scalp. . Cut your hair short. It eases the inconvenience of shedding lots of hair, but it also can reduce the emotional impact of watching your hair fall out. . Don't perm or color your hair during chemotherapy. Those chemical treatments are already damaging to hair and can enhance hair loss. Once your chemo treatments are done and your hair has grown back, it's OK to resume dyeing or perming hair. With chemotherapy, hair loss is almost always temporary. But when it grows back, it may be a different color or texture. In older adults who still had hair color before chemotherapy, the new growth may be completely gray.  Often, new hair is very fine and soft.  Infection Prevention Please wash your hands for at least 30 seconds using warm soapy water. Handwashing is the #1 way to prevent the spread of germs. Stay away from sick people or people who are getting over a cold. If you develop respiratory systems such as green/yellow mucus production or productive cough or persistent cough let us know and we will see if you need an antibiotic. It is a good idea to keep a pair of gloves on when going into grocery stores/[employer] to decrease your risk of coming into contact with germs on the carts, etc. Carry alcohol hand gel with you at all times and use it frequently if out in public. If your temperature reaches 100.5 or higher please call the clinic and let us know.  If it is  after hours or on the weekend please go to the ER if your temperature is over 100.5.  Please have your own personal thermometer at home to  use.    Sex and bodily fluids If you are going to have sex, a condom must be used to protect the person that isn't taking chemotherapy. Chemo can decrease your libido (sex drive). For a few days after chemotherapy, chemotherapy can be excreted through your bodily fluids.  When using the toilet please close the lid and flush the toilet twice.  Do this for a few day after you have had chemotherapy.     Effects of chemotherapy on your sex life Some changes are simple and won't last long. They won't affect your sex life permanently. Sometimes you may feel: . too tired . not strong enough to be very active . sick or sore  . not in the mood . anxious or low Your anxiety might not seem related to sex. For example, you may be worried about the cancer and how your treatment is going. Or you may be worried about money, or about how you family are coping with your illness. These things can cause stress, which can affect your interest in sex. It's important to talk to your partner about how you feel. Remember - the changes to your sex life don't usually last long. There's usually no medical reason to stop having sex during chemo. The drugs won't have any long term physical effects on your performance or enjoyment of sex. Cancer can't be passed on to your partner during sex  Contraception It's important to use reliable contraception during treatment. Avoid getting pregnant while you or your partner are having chemotherapy. This is because the drugs may harm the baby. Sometimes chemotherapy drugs can leave a man or woman infertile.  This means you would not be able to have children in the future. You might want to talk to someone about permanent infertility. It can be very difficult to learn that you may no longer be able to have children. Some people find counselling  helpful. There might be ways to preserve your fertility, although this is easier for men than for women. You may want to speak to a fertility expert. You can talk about sperm banking or harvesting your eggs. You can also ask about other fertility options, such as donor eggs. If you have or have had breast cancer, your doctor might advise you not to take the contraceptive pill. This is because the hormones in it might affect the cancer.  It is not known for sure whether or not chemotherapy drugs can be passed on through semen or secretions from the vagina. Because of this some doctors advise people to use a barrier method if you have sex during treatment. This applies to vaginal, anal or oral sex. Generally, doctors advise a barrier method only for the time you are actually having the treatment and for about a week after your treatment. Advice like this can be worrying, but this does not mean that you have to avoid being intimate with your partner. You can still have close contact with your partner and continue to enjoy sex.  Animals If you have cats or birds we just ask that you not change the litter or change the cage.  Please have someone else do this for you while you are on chemotherapy.   Food Safety During and After Cancer Treatment Food safety is important for people both during and after cancer treatment. Cancer and cancer treatments, such as chemotherapy, radiation therapy, and stem cell/bone marrow transplantation, often weaken the immune system. This makes it harder for your body to protect itself  from foodborne illness, also called food poisoning. Foodborne illness is caused by eating food that contains harmful bacteria, parasites, or viruses.  Foods to avoid Some foods have a higher risk of becoming tainted with bacteria. These include: Marland Kitchen Unwashed fresh fruit and vegetables, especially leafy vegetables that can hide dirt and other contaminants . Raw sprouts, such as alfalfa  sprouts . Raw or undercooked beef, especially ground beef, or other raw or undercooked meat and poultry . Fatty, fried, or spicy foods immediately before or after treatment.  These can sit heavy on your stomach and make you feel nauseous. . Raw or undercooked shellfish, such as oysters. . Sushi and sashimi, which often contain raw fish.  . Unpasteurized beverages, such as unpasteurized fruit juices, raw milk, raw yogurt, or cider . Undercooked eggs, such as soft boiled, over easy, and poached; raw, unpasteurized eggs; or foods made with raw egg, such as homemade raw cookie dough and homemade mayonnaise Simple steps for food safety Shop smart. . Do not buy food stored or displayed in an unclean area. . Do not buy bruised or damaged fruits or vegetables. . Do not buy cans that have cracks, dents, or bulges. . Pick up foods that can spoil at the end of your shopping trip and store them in a cooler on the way home. Prepare and clean up foods carefully. . Rinse all fresh fruits and vegetables under running water, and dry them with a clean towel or paper towel. . Clean the top of cans before opening them. . After preparing food, wash your hands for 20 seconds with hot water and soap. Pay special attention to areas between fingers and under nails. . Clean your utensils and dishes with hot water and soap. Marland Kitchen Disinfect your kitchen and cutting boards using 1 teaspoon of liquid, unscented bleach mixed into 1 quart of water.   Dispose of old food. . Eat canned and packaged food before its expiration date (the "use by" or "best before" date). . Consume refrigerated leftovers within 3 to 4 days. After that time, throw out the food. Even if the food does not smell or look spoiled, it still may be unsafe. Some bacteria, such as Listeria, can grow even on foods stored in the refrigerator if they are kept for too long. Take precautions when eating out. . At restaurants, avoid buffets and salad bars where food  sits out for a long time and comes in contact with many people. Food can become contaminated when someone with a virus, often a norovirus, or another "bug" handles it. . Put any leftover food in a "to-go" container yourself, rather than having the server do it. And, refrigerate leftovers as soon as you get home. . Choose restaurants that are clean and that are willing to prepare your food as you order it cooked.      MEDICATIONS:  Zofran/Ondansetron 8mg  tablet. Take 1 tablet every 8 hours as needed for nausea/vomiting. (#1 nausea med to take, this can constipate)  Compazine/Prochlorperazine 10mg  tablet. Take 1 tablet every 6 hours as needed for nausea/vomiting. (#2 nausea med to take, this can make you sleepy)   Over-the-Counter Meds:  Miralax 1 capful in 8 oz of fluid daily. May increase to two times a day if needed. This is a stool softener. If this doesn't work proceed you can add:  Senokot S-start with 1 tablet two times a day and increase to 4 tablets two times a day if needed. (total of 8 tablets in a 24 hour period). This is a stimulant laxative.   Call us if this does not help your bowels move.   Imodium 2mg  capsule. Take 2 capsules after the 1st loose stool and then 1 capsule every 2 hours until you go a total of 12 hours without having a loose stool. Call the [facility] if loose stools continue. If diarrhea occurs @ bedtime, take 2 capsules @ bedtime. Then take 2 capsules every 4 hours until morning. Call [facility].     Diarrhea Sheet  If you are having loose stools/diarrhea, please purchase Imodium and begin taking as outlined:  At the first sign of poorly formed or loose stools you should begin taking Imodium(loperamide) 2 mg capsules.  Take two caplets (4mg ) followed by one caplet (2mg ) every 2 hours until you have had  no diarrhea for 12 hours.  During the night take two caplets (4mg ) at bedtime and continue every 4 hours during the night until the morning.  Stop taking Imodium only after there is no sign of diarrhea for 12 hours.    Always call the [facility] if you are having loose stools/diarrhea that you can't get under control.  Loose stools/disrrhea leads to dehydration (loss of water) in your body.  We have other options of trying to get the loose stools/diarrhea to stopped but you must let us know!     Constipation Sheet *Miralax in 8 oz of fluid daily.  May increase to two times a day if needed.  This is a stool softener.  If this not enough to keep your bowel regular:  You can add:  *Senokot S, start with one tablet twice a day and can increase to 4 tablets twice a day if needed.  This is a stimulant laxative.   Sometimes when you take pain medication you need BOTH a medicine to keep your stool soft and a medicine to help your bowel push it out!  Please call if the above does not work for you.   Do not go more than 2 days without a bowel movement.  It is very important that you do not become constipated.  It will make you feel sick to your stomach (nausea) and can cause abdominal pain and vomiting.     Nausea Sheet  Zofran/Ondansetron 8mg  tablet. Take 1 tablet every 8 hours as needed for nausea/vomiting. (#1 nausea med to take, this can constipate)  Compazine/Prochlorperazine 10mg  tablet. Take 1 tablet every 6 hours as needed for nausea/vomiting. (#2 nausea med to take, this can make you sleepy)  You can take these medications together or separately.  We would first like for you to try the Ondansetron by itself and then take the Prochloperizine if needed. But you are allowed to take both medications at the same time if your nausea is that severe.  If you are having persistent nausea (nausea  that does not stop) please take these medications on a staggered schedule so that the nausea  medication stays in your body.  Please call the [facility] and let us know the amount of nausea that you are experiencing.  If you begin to vomit, you need to call the [facility] and if it is the weekend and you have vomited more than one time and cant get it to stop-go to the Emergency Room.  Persistent nausea/vomiting can lead to dehydration (loss of fluid in your body) and will make you feel terrible.   Ice chips, sips of clear liquids, foods that are @ room temperature, crackers, and toast tend to be better tolerated.     SYMPTOMS TO REPORT AS SOON AS POSSIBLE AFTER TREATMENT:  FEVER GREATER THAN 100.5 F  CHILLS WITH OR WITHOUT FEVER  NAUSEA AND VOMITING THAT IS NOT CONTROLLED WITH YOUR NAUSEA MEDICATION  UNUSUAL SHORTNESS OF BREATH  UNUSUAL BRUISING OR BLEEDING  TENDERNESS IN MOUTH AND THROAT WITH OR WITHOUT PRESENCE OF ULCERS  URINARY PROBLEMS  BOWEL PROBLEMS  UNUSUAL RASH    Wear comfortable clothing and clothing appropriate for easy access to any Portacath or PICC line. Let us know if there is anything that we can do to make your therapy better!    What to do if you need assistance after hours or on the weekends: CALL [phone].  HOLD on the line, do not hang up.  You will hear multiple messages but at the end you will be connected with a nurse triage line.  They will contact the doctor if necessary.  Most of the time they will be able to assist you.  Do not call the hospital operator.       I have been informed and understand all of the instructions given to me and have received a copy. I have been instructed to call the clinic [phone] or my family physician as soon as possible for continued medical care, if indicated. I do not have any more questions at this time but understand that I may call the [facility] or the Patient Navigator at [phone] during office hours should I have questions or need assistance in obtaining follow-up  care.

## 2017-01-19 NOTE — Progress Notes (Signed)
Called pt and taught pt over the phone about the side effects of tarceva, when to take the drug, and how to handle the drug.  Any medication interaction explained.  Pt will start taking tarceva in the morning 01/20/2017 and knows to take this on a empty stomach.  Pt and sister were listening and asking questions.  They know to call Anderson Malta back if they have any problems once she starts taking this medication.  Pt will take one tablet daily.

## 2017-01-28 ENCOUNTER — Encounter (HOSPITAL_COMMUNITY): Payer: Medicare HMO | Attending: Oncology

## 2017-01-28 ENCOUNTER — Encounter (HOSPITAL_COMMUNITY): Payer: Self-pay

## 2017-01-28 VITALS — BP 161/56 | HR 56 | Temp 97.7°F | Resp 18 | Wt 127.4 lb

## 2017-01-28 DIAGNOSIS — C259 Malignant neoplasm of pancreas, unspecified: Secondary | ICD-10-CM | POA: Diagnosis not present

## 2017-01-28 DIAGNOSIS — C787 Secondary malignant neoplasm of liver and intrahepatic bile duct: Secondary | ICD-10-CM | POA: Diagnosis not present

## 2017-01-28 DIAGNOSIS — Z5111 Encounter for antineoplastic chemotherapy: Secondary | ICD-10-CM

## 2017-01-28 LAB — CBC WITH DIFFERENTIAL/PLATELET
BASOS PCT: 1 %
Basophils Absolute: 0.1 10*3/uL (ref 0.0–0.1)
EOS ABS: 0.9 10*3/uL — AB (ref 0.0–0.7)
Eosinophils Relative: 15 %
HCT: 29.8 % — ABNORMAL LOW (ref 36.0–46.0)
Hemoglobin: 9.8 g/dL — ABNORMAL LOW (ref 12.0–15.0)
Lymphocytes Relative: 22 %
Lymphs Abs: 1.4 10*3/uL (ref 0.7–4.0)
MCH: 29.4 pg (ref 26.0–34.0)
MCHC: 32.9 g/dL (ref 30.0–36.0)
MCV: 89.5 fL (ref 78.0–100.0)
MONO ABS: 0.6 10*3/uL (ref 0.1–1.0)
MONOS PCT: 10 %
Neutro Abs: 3.3 10*3/uL (ref 1.7–7.7)
Neutrophils Relative %: 52 %
PLATELETS: 270 10*3/uL (ref 150–400)
RBC: 3.33 MIL/uL — ABNORMAL LOW (ref 3.87–5.11)
RDW: 15.9 % — AB (ref 11.5–15.5)
WBC: 6.2 10*3/uL (ref 4.0–10.5)

## 2017-01-28 LAB — COMPREHENSIVE METABOLIC PANEL
ALT: 12 U/L — AB (ref 14–54)
ANION GAP: 8 (ref 5–15)
AST: 21 U/L (ref 15–41)
Albumin: 3.1 g/dL — ABNORMAL LOW (ref 3.5–5.0)
Alkaline Phosphatase: 125 U/L (ref 38–126)
BUN: 18 mg/dL (ref 6–20)
CHLORIDE: 103 mmol/L (ref 101–111)
CO2: 28 mmol/L (ref 22–32)
CREATININE: 0.96 mg/dL (ref 0.44–1.00)
Calcium: 8.7 mg/dL — ABNORMAL LOW (ref 8.9–10.3)
GFR, EST NON AFRICAN AMERICAN: 52 mL/min — AB (ref >60)
Glucose, Bld: 92 mg/dL (ref 65–99)
Potassium: 4 mmol/L (ref 3.5–5.1)
SODIUM: 139 mmol/L (ref 135–145)
Total Bilirubin: 0.5 mg/dL (ref 0.3–1.2)
Total Protein: 7.3 g/dL (ref 6.5–8.1)

## 2017-01-28 MED ORDER — PROCHLORPERAZINE MALEATE 10 MG PO TABS
ORAL_TABLET | ORAL | Status: AC
  Filled 2017-01-28: qty 1

## 2017-01-28 MED ORDER — SODIUM CHLORIDE 0.9 % IV SOLN
Freq: Once | INTRAVENOUS | Status: AC
  Administered 2017-01-28: 11:00:00 via INTRAVENOUS

## 2017-01-28 MED ORDER — HEPARIN SOD (PORK) LOCK FLUSH 100 UNIT/ML IV SOLN
500.0000 [IU] | Freq: Once | INTRAVENOUS | Status: AC | PRN
  Administered 2017-01-28: 500 [IU]

## 2017-01-28 MED ORDER — SODIUM CHLORIDE 0.9% FLUSH
10.0000 mL | INTRAVENOUS | Status: DC | PRN
  Administered 2017-01-28: 10 mL
  Filled 2017-01-28: qty 10

## 2017-01-28 MED ORDER — PROCHLORPERAZINE MALEATE 10 MG PO TABS
10.0000 mg | ORAL_TABLET | Freq: Once | ORAL | Status: AC
  Administered 2017-01-28: 10 mg via ORAL

## 2017-01-28 MED ORDER — SODIUM CHLORIDE 0.9 % IV SOLN
800.0000 mg/m2 | Freq: Once | INTRAVENOUS | Status: AC
  Administered 2017-01-28: 1292 mg via INTRAVENOUS
  Filled 2017-01-28: qty 10.52

## 2017-01-28 NOTE — Progress Notes (Signed)
To treatment area for labs and chemotherapy.  Started Tarceva last week and stated only difference she has noticed is decreased appetite.  Lost 2 lbs but feels it is due to changing her supplement.  Reviewed labs with Mike Craze, NP, and ok to treat today.   Patient tolerated chemotherapy with no complaints voiced.  Port site clean and dry with no bruising or swelling noted at site.  Band aid applied.  VSS stable with discharge and left ambulatory.  Instructed to call for any questions or concerns with understanding verbalized.

## 2017-01-28 NOTE — Patient Instructions (Signed)
[facility] Discharge Instructions for Patients Receiving Chemotherapy  Today you received the following chemotherapy agents Gemzar.  Call for any questions or concerns.   To help prevent nausea and vomiting after your treatment, we encourage you to take your nausea medication.    If you develop nausea and vomiting that is not controlled by your nausea medication, call the clinic.   BELOW ARE SYMPTOMS THAT SHOULD BE REPORTED IMMEDIATELY:  *FEVER GREATER THAN 100.5 F  *CHILLS WITH OR WITHOUT FEVER  NAUSEA AND VOMITING THAT IS NOT CONTROLLED WITH YOUR NAUSEA MEDICATION  *UNUSUAL SHORTNESS OF BREATH  *UNUSUAL BRUISING OR BLEEDING  TENDERNESS IN MOUTH AND THROAT WITH OR WITHOUT PRESENCE OF ULCERS  *URINARY PROBLEMS  *BOWEL PROBLEMS  UNUSUAL RASH Items with * indicate a potential emergency and should be followed up as soon as possible.  Feel free to call the clinic you have any questions or concerns. The clinic phone number is [phone].  Please show the [place] at check-in to the Emergency Department and triage nurse.

## 2017-01-29 DIAGNOSIS — C259 Malignant neoplasm of pancreas, unspecified: Secondary | ICD-10-CM | POA: Diagnosis not present

## 2017-01-29 DIAGNOSIS — C787 Secondary malignant neoplasm of liver and intrahepatic bile duct: Secondary | ICD-10-CM | POA: Diagnosis not present

## 2017-02-04 ENCOUNTER — Encounter (HOSPITAL_COMMUNITY): Payer: Self-pay

## 2017-02-04 ENCOUNTER — Encounter (HOSPITAL_BASED_OUTPATIENT_CLINIC_OR_DEPARTMENT_OTHER): Payer: Medicare HMO

## 2017-02-04 DIAGNOSIS — F419 Anxiety disorder, unspecified: Secondary | ICD-10-CM | POA: Diagnosis not present

## 2017-02-04 DIAGNOSIS — M549 Dorsalgia, unspecified: Secondary | ICD-10-CM | POA: Diagnosis not present

## 2017-02-04 DIAGNOSIS — E876 Hypokalemia: Secondary | ICD-10-CM | POA: Diagnosis not present

## 2017-02-04 DIAGNOSIS — K219 Gastro-esophageal reflux disease without esophagitis: Secondary | ICD-10-CM | POA: Diagnosis not present

## 2017-02-04 DIAGNOSIS — G8929 Other chronic pain: Secondary | ICD-10-CM | POA: Diagnosis not present

## 2017-02-04 DIAGNOSIS — Z8249 Family history of ischemic heart disease and other diseases of the circulatory system: Secondary | ICD-10-CM | POA: Diagnosis not present

## 2017-02-04 DIAGNOSIS — C259 Malignant neoplasm of pancreas, unspecified: Secondary | ICD-10-CM | POA: Diagnosis not present

## 2017-02-04 DIAGNOSIS — Z9049 Acquired absence of other specified parts of digestive tract: Secondary | ICD-10-CM | POA: Diagnosis not present

## 2017-02-04 DIAGNOSIS — C787 Secondary malignant neoplasm of liver and intrahepatic bile duct: Secondary | ICD-10-CM

## 2017-02-04 DIAGNOSIS — Z801 Family history of malignant neoplasm of trachea, bronchus and lung: Secondary | ICD-10-CM | POA: Diagnosis not present

## 2017-02-04 DIAGNOSIS — I10 Essential (primary) hypertension: Secondary | ICD-10-CM | POA: Diagnosis not present

## 2017-02-04 DIAGNOSIS — Z66 Do not resuscitate: Secondary | ICD-10-CM | POA: Diagnosis not present

## 2017-02-04 DIAGNOSIS — Z808 Family history of malignant neoplasm of other organs or systems: Secondary | ICD-10-CM | POA: Diagnosis not present

## 2017-02-04 DIAGNOSIS — F329 Major depressive disorder, single episode, unspecified: Secondary | ICD-10-CM | POA: Diagnosis not present

## 2017-02-04 DIAGNOSIS — R69 Illness, unspecified: Secondary | ICD-10-CM | POA: Diagnosis not present

## 2017-02-04 DIAGNOSIS — Z87891 Personal history of nicotine dependence: Secondary | ICD-10-CM | POA: Diagnosis not present

## 2017-02-04 DIAGNOSIS — Z23 Encounter for immunization: Secondary | ICD-10-CM

## 2017-02-04 DIAGNOSIS — Z8719 Personal history of other diseases of the digestive system: Secondary | ICD-10-CM | POA: Diagnosis not present

## 2017-02-04 DIAGNOSIS — Z833 Family history of diabetes mellitus: Secondary | ICD-10-CM | POA: Diagnosis not present

## 2017-02-04 DIAGNOSIS — Z9889 Other specified postprocedural states: Secondary | ICD-10-CM | POA: Diagnosis not present

## 2017-02-04 DIAGNOSIS — Z9221 Personal history of antineoplastic chemotherapy: Secondary | ICD-10-CM | POA: Diagnosis not present

## 2017-02-04 DIAGNOSIS — Z841 Family history of disorders of kidney and ureter: Secondary | ICD-10-CM | POA: Diagnosis not present

## 2017-02-04 DIAGNOSIS — R350 Frequency of micturition: Secondary | ICD-10-CM | POA: Diagnosis not present

## 2017-02-04 DIAGNOSIS — Z87442 Personal history of urinary calculi: Secondary | ICD-10-CM | POA: Diagnosis not present

## 2017-02-04 LAB — CBC WITH DIFFERENTIAL/PLATELET
BASOS PCT: 2 %
Basophils Absolute: 0.1 10*3/uL (ref 0.0–0.1)
EOS PCT: 7 %
Eosinophils Absolute: 0.2 10*3/uL (ref 0.0–0.7)
HEMATOCRIT: 28.1 % — AB (ref 36.0–46.0)
Hemoglobin: 9.5 g/dL — ABNORMAL LOW (ref 12.0–15.0)
LYMPHS ABS: 1.2 10*3/uL (ref 0.7–4.0)
Lymphocytes Relative: 49 %
MCH: 29.6 pg (ref 26.0–34.0)
MCHC: 33.8 g/dL (ref 30.0–36.0)
MCV: 87.5 fL (ref 78.0–100.0)
MONOS PCT: 9 %
Monocytes Absolute: 0.2 10*3/uL (ref 0.1–1.0)
Neutro Abs: 0.8 10*3/uL — ABNORMAL LOW (ref 1.7–7.7)
Neutrophils Relative %: 33 %
Platelets: 136 10*3/uL — ABNORMAL LOW (ref 150–400)
RBC: 3.21 MIL/uL — AB (ref 3.87–5.11)
RDW: 15.6 % — AB (ref 11.5–15.5)
WBC: 2.5 10*3/uL — AB (ref 4.0–10.5)

## 2017-02-04 LAB — COMPREHENSIVE METABOLIC PANEL
ALT: 15 U/L (ref 14–54)
AST: 21 U/L (ref 15–41)
Albumin: 3.2 g/dL — ABNORMAL LOW (ref 3.5–5.0)
Alkaline Phosphatase: 120 U/L (ref 38–126)
Anion gap: 10 (ref 5–15)
BILIRUBIN TOTAL: 0.5 mg/dL (ref 0.3–1.2)
BUN: 19 mg/dL (ref 6–20)
CO2: 26 mmol/L (ref 22–32)
CREATININE: 1.05 mg/dL — AB (ref 0.44–1.00)
Calcium: 8.9 mg/dL (ref 8.9–10.3)
Chloride: 100 mmol/L — ABNORMAL LOW (ref 101–111)
GFR, EST AFRICAN AMERICAN: 54 mL/min — AB (ref >60)
GFR, EST NON AFRICAN AMERICAN: 47 mL/min — AB (ref >60)
Glucose, Bld: 94 mg/dL (ref 65–99)
Potassium: 3.7 mmol/L (ref 3.5–5.1)
Sodium: 136 mmol/L (ref 135–145)
TOTAL PROTEIN: 7.4 g/dL (ref 6.5–8.1)

## 2017-02-04 MED ORDER — SODIUM CHLORIDE 0.9% FLUSH: 20.0000 mL | INTRAVENOUS | Status: DC | PRN

## 2017-02-04 MED ORDER — HEPARIN SOD (PORK) LOCK FLUSH 100 UNIT/ML IV SOLN
INTRAVENOUS | Status: AC
  Filled 2017-02-04: qty 5

## 2017-02-04 MED ORDER — SODIUM CHLORIDE 0.9% FLUSH
20.0000 mL | INTRAVENOUS | Status: DC | PRN
  Administered 2017-02-04: 20 mL via INTRAVENOUS
  Filled 2017-02-04: qty 20

## 2017-02-04 MED ORDER — HEPARIN SOD (PORK) LOCK FLUSH 100 UNIT/ML IV SOLN
500.0000 [IU] | Freq: Once | INTRAVENOUS | Status: AC
  Administered 2017-02-04: 500 [IU] via INTRAVENOUS

## 2017-02-04 MED ORDER — INFLUENZA VAC SPLIT QUAD 0.5 ML IM SUSY
PREFILLED_SYRINGE | INTRAMUSCULAR | Status: AC
  Filled 2017-02-04: qty 0.5

## 2017-02-04 MED ORDER — INFLUENZA VAC SPLIT QUAD 0.5 ML IM SUSY
0.5000 mL | PREFILLED_SYRINGE | Freq: Once | INTRAMUSCULAR | Status: AC
  Administered 2017-02-04: 0.5 mL via INTRAMUSCULAR

## 2017-02-04 MED ORDER — HEPARIN SOD (PORK) LOCK FLUSH 100 UNIT/ML IV SOLN
500.0000 [IU] | Freq: Once | INTRAVENOUS | Status: DC
Start: 2017-02-04 — End: 2017-02-04

## 2017-02-04 NOTE — Patient Instructions (Signed)
[place] at [facility] Discharge Instructions  RECOMMENDATIONS MADE BY THE CONSULTANT AND ANY TEST RESULTS WILL BE SENT TO YOUR REFERRING PHYSICIAN.  Labs drawn from portacath today then flushed per protocol. Flu vaccine administered as well. Chemo tx held today. Follow-up as scheduled. Call clinic for any questions or concerns  Thank you for choosing [place] at [facility] to provide your oncology and hematology care.  To afford each patient quality time with our provider, please arrive at least 15 minutes before your scheduled appointment time.    If you have a lab appointment with the [facility] please come in thru the  Main Entrance and check in at the main information desk  You need to re-schedule your appointment should you arrive 10 or more minutes late.  We strive to give you quality time with our providers, and arriving late affects you and other patients whose appointments are after yours.  Also, if you no show three or more times for appointments you may be dismissed from the clinic at the providers discretion.     Again, thank you for choosing [facility].  Our hope is that these requests will decrease the amount of time that you wait before being seen by our physicians.       _____________________________________________________________  Should you have questions after your visit to [facility], please contact our office at [phone] between the hours of 8:30 a.m. and 4:30 p.m.  Voicemails left after 4:30 p.m. will not be returned until the following business day.  For prescription refill requests, have your pharmacy contact our office.       Resources For Cancer Patients and their Caregivers ? American Cancer Society: Can assist with transportation, wigs, general needs, runs Look Good Feel Better.        364-802-9065 ? Cancer Care: Provides financial assistance, online support groups,  medication/co-pay assistance.  1-800-813-HOPE 405-322-0730) ? [place] Assists [place] cancer patients and their families through emotional , educational and financial support.  (502) 689-6661 ? [place] DSS Where to apply for food stamps, Medicaid and utility assistance. 978-530-8799 ? [employer]: Transportation to medical appointments. 870-824-0333 ? Social Security Administration: May apply for disability if have a Stage IV cancer. (336)585-1334 442-699-4968 ? [employer], Disability and Transit Services: Assists with nutrition, care and transit needs. [place] Support Programs: @10RELATIVEDAYS @ > Cancer Support Group  2nd Tuesday of the month 1pm-2pm, Journey Room  > Creative Journey  3rd Tuesday of the month 1130am-1pm, Journey Room  > Look Good Feel Better  1st Wednesday of the month 10am-12 noon, Journey Room (Call [place] to register [phone])

## 2017-02-04 NOTE — Progress Notes (Signed)
[name] tolerated port lab draw with flush and Flu vaccine well without complaints or incident.

## 2017-02-04 NOTE — Progress Notes (Signed)
[name] tolerated port lab draw with flush and Flu vaccine well without complaints or incident.Labs reviewed with Dr. Talbert Cage and pt's Gemzar to be held today due to [facility] of 0.8. Pt continues to take her Tarceva at home as prescribed without issues. Pt instructed to call or come to ER for fever or chills and verbalized understanding.VSS Pt discharged self ambulatory using cane in satisfactory condition

## 2017-02-05 LAB — CANCER ANTIGEN 19-9: CA 19-9: 95 U/mL — ABNORMAL HIGH (ref 0–35)

## 2017-02-11 ENCOUNTER — Encounter (HOSPITAL_COMMUNITY): Payer: Self-pay

## 2017-02-11 ENCOUNTER — Other Ambulatory Visit: Payer: Self-pay | Admitting: Family Medicine

## 2017-02-11 ENCOUNTER — Encounter (HOSPITAL_COMMUNITY): Payer: Medicare HMO | Attending: Oncology

## 2017-02-11 VITALS — BP 150/59 | HR 59 | Temp 98.2°F | Resp 18 | Wt 126.0 lb

## 2017-02-11 DIAGNOSIS — Z8249 Family history of ischemic heart disease and other diseases of the circulatory system: Secondary | ICD-10-CM | POA: Diagnosis not present

## 2017-02-11 DIAGNOSIS — Z8719 Personal history of other diseases of the digestive system: Secondary | ICD-10-CM | POA: Insufficient documentation

## 2017-02-11 DIAGNOSIS — Z87442 Personal history of urinary calculi: Secondary | ICD-10-CM | POA: Insufficient documentation

## 2017-02-11 DIAGNOSIS — Z9049 Acquired absence of other specified parts of digestive tract: Secondary | ICD-10-CM | POA: Insufficient documentation

## 2017-02-11 DIAGNOSIS — Z841 Family history of disorders of kidney and ureter: Secondary | ICD-10-CM | POA: Diagnosis not present

## 2017-02-11 DIAGNOSIS — M545 Low back pain: Secondary | ICD-10-CM | POA: Diagnosis not present

## 2017-02-11 DIAGNOSIS — E876 Hypokalemia: Secondary | ICD-10-CM | POA: Diagnosis not present

## 2017-02-11 DIAGNOSIS — F419 Anxiety disorder, unspecified: Secondary | ICD-10-CM | POA: Insufficient documentation

## 2017-02-11 DIAGNOSIS — M5136 Other intervertebral disc degeneration, lumbar region: Secondary | ICD-10-CM | POA: Diagnosis not present

## 2017-02-11 DIAGNOSIS — Z5111 Encounter for antineoplastic chemotherapy: Secondary | ICD-10-CM | POA: Diagnosis not present

## 2017-02-11 DIAGNOSIS — R69 Illness, unspecified: Secondary | ICD-10-CM | POA: Diagnosis not present

## 2017-02-11 DIAGNOSIS — Z9221 Personal history of antineoplastic chemotherapy: Secondary | ICD-10-CM | POA: Insufficient documentation

## 2017-02-11 DIAGNOSIS — Z808 Family history of malignant neoplasm of other organs or systems: Secondary | ICD-10-CM | POA: Diagnosis not present

## 2017-02-11 DIAGNOSIS — G8929 Other chronic pain: Secondary | ICD-10-CM | POA: Insufficient documentation

## 2017-02-11 DIAGNOSIS — Z87891 Personal history of nicotine dependence: Secondary | ICD-10-CM | POA: Insufficient documentation

## 2017-02-11 DIAGNOSIS — Z9889 Other specified postprocedural states: Secondary | ICD-10-CM | POA: Diagnosis not present

## 2017-02-11 DIAGNOSIS — K219 Gastro-esophageal reflux disease without esophagitis: Secondary | ICD-10-CM | POA: Diagnosis not present

## 2017-02-11 DIAGNOSIS — M549 Dorsalgia, unspecified: Secondary | ICD-10-CM | POA: Insufficient documentation

## 2017-02-11 DIAGNOSIS — F329 Major depressive disorder, single episode, unspecified: Secondary | ICD-10-CM | POA: Diagnosis not present

## 2017-02-11 DIAGNOSIS — Z801 Family history of malignant neoplasm of trachea, bronchus and lung: Secondary | ICD-10-CM | POA: Diagnosis not present

## 2017-02-11 DIAGNOSIS — R350 Frequency of micturition: Secondary | ICD-10-CM | POA: Diagnosis not present

## 2017-02-11 DIAGNOSIS — Z833 Family history of diabetes mellitus: Secondary | ICD-10-CM | POA: Diagnosis not present

## 2017-02-11 DIAGNOSIS — I10 Essential (primary) hypertension: Secondary | ICD-10-CM | POA: Diagnosis not present

## 2017-02-11 DIAGNOSIS — Z66 Do not resuscitate: Secondary | ICD-10-CM | POA: Diagnosis not present

## 2017-02-11 DIAGNOSIS — C787 Secondary malignant neoplasm of liver and intrahepatic bile duct: Secondary | ICD-10-CM | POA: Diagnosis not present

## 2017-02-11 DIAGNOSIS — C259 Malignant neoplasm of pancreas, unspecified: Secondary | ICD-10-CM

## 2017-02-11 LAB — COMPREHENSIVE METABOLIC PANEL
ALBUMIN: 3.4 g/dL — AB (ref 3.5–5.0)
ALK PHOS: 137 U/L — AB (ref 38–126)
ALT: 15 U/L (ref 14–54)
ANION GAP: 7 (ref 5–15)
AST: 23 U/L (ref 15–41)
BUN: 19 mg/dL (ref 6–20)
CALCIUM: 8.7 mg/dL — AB (ref 8.9–10.3)
CHLORIDE: 103 mmol/L (ref 101–111)
CO2: 26 mmol/L (ref 22–32)
Creatinine, Ser: 1.03 mg/dL — ABNORMAL HIGH (ref 0.44–1.00)
GFR calc non Af Amer: 48 mL/min — ABNORMAL LOW (ref >60)
GFR, EST AFRICAN AMERICAN: 55 mL/min — AB (ref >60)
GLUCOSE: 94 mg/dL (ref 65–99)
Potassium: 3.9 mmol/L (ref 3.5–5.1)
SODIUM: 136 mmol/L (ref 135–145)
Total Bilirubin: 0.6 mg/dL (ref 0.3–1.2)
Total Protein: 7.5 g/dL (ref 6.5–8.1)

## 2017-02-11 LAB — CBC WITH DIFFERENTIAL/PLATELET
BASOS ABS: 0.1 10*3/uL (ref 0.0–0.1)
Basophils Relative: 1 %
EOS ABS: 0.4 10*3/uL (ref 0.0–0.7)
EOS PCT: 8 %
HCT: 30.4 % — ABNORMAL LOW (ref 36.0–46.0)
Hemoglobin: 9.9 g/dL — ABNORMAL LOW (ref 12.0–15.0)
LYMPHS ABS: 1.9 10*3/uL (ref 0.7–4.0)
Lymphocytes Relative: 36 %
MCH: 28.7 pg (ref 26.0–34.0)
MCHC: 32.6 g/dL (ref 30.0–36.0)
MCV: 88.1 fL (ref 78.0–100.0)
MONO ABS: 0.5 10*3/uL (ref 0.1–1.0)
Monocytes Relative: 10 %
NEUTROS PCT: 45 %
Neutro Abs: 2.3 10*3/uL (ref 1.7–7.7)
Platelets: 140 10*3/uL — ABNORMAL LOW (ref 150–400)
RBC: 3.45 MIL/uL — AB (ref 3.87–5.11)
RDW: 16.8 % — ABNORMAL HIGH (ref 11.5–15.5)
WBC: 5.2 10*3/uL (ref 4.0–10.5)

## 2017-02-11 MED ORDER — SODIUM CHLORIDE 0.9% FLUSH: 10.0000 mL | INTRAVENOUS | Status: DC | PRN

## 2017-02-11 MED ORDER — PROCHLORPERAZINE MALEATE 10 MG PO TABS
10.0000 mg | ORAL_TABLET | Freq: Once | ORAL | Status: AC
  Administered 2017-02-11: 10 mg via ORAL

## 2017-02-11 MED ORDER — HEPARIN SOD (PORK) LOCK FLUSH 100 UNIT/ML IV SOLN
500.0000 [IU] | Freq: Once | INTRAVENOUS | Status: AC | PRN
  Administered 2017-02-11: 500 [IU]
  Filled 2017-02-11: qty 5

## 2017-02-11 MED ORDER — GEMCITABINE HCL CHEMO INJECTION 1 GM/26.3ML
800.0000 mg/m2 | Freq: Once | INTRAVENOUS | Status: AC
  Administered 2017-02-11: 1292 mg via INTRAVENOUS
  Filled 2017-02-11: qty 23.48

## 2017-02-11 MED ORDER — SODIUM CHLORIDE 0.9 % IV SOLN
Freq: Once | INTRAVENOUS | Status: AC
  Administered 2017-02-11: 12:00:00 via INTRAVENOUS

## 2017-02-11 MED ORDER — PROCHLORPERAZINE MALEATE 10 MG PO TABS
ORAL_TABLET | ORAL | Status: AC
Start: 2017-02-11 — End: ?
  Filled 2017-02-11: qty 1

## 2017-02-11 NOTE — Telephone Encounter (Signed)
Issue resolved.

## 2017-02-11 NOTE — Patient Instructions (Signed)
[facility] Discharge Instructions for Patients Receiving Chemotherapy   Beginning January 23rd 2017 lab work for the Cancer Center will be done in the  Main lab at [place] on 1st floor. If you have a lab appointment with the Cancer Center please come in thru the  Main Entrance and check in at the main information desk   Today you received the following chemotherapy agents   To help prevent nausea and vomiting after your treatment, we encourage you to take your nausea medication     If you develop nausea and vomiting, or diarrhea that is not controlled by your medication, call the clinic.  The clinic phone number is [phone]. Office hours are Monday-Friday 8:30am-5:00pm.  BELOW ARE SYMPTOMS THAT SHOULD BE REPORTED IMMEDIATELY:  *FEVER GREATER THAN 101.0 F  *CHILLS WITH OR WITHOUT FEVER  NAUSEA AND VOMITING THAT IS NOT CONTROLLED WITH YOUR NAUSEA MEDICATION  *UNUSUAL SHORTNESS OF BREATH  *UNUSUAL BRUISING OR BLEEDING  TENDERNESS IN MOUTH AND THROAT WITH OR WITHOUT PRESENCE OF ULCERS  *URINARY PROBLEMS  *BOWEL PROBLEMS  UNUSUAL RASH Items with * indicate a potential emergency and should be followed up as soon as possible. If you have an emergency after office hours please contact your primary care physician or go to the nearest emergency department.  Please call the clinic during office hours if you have any questions or concerns.   You may also contact the Patient Navigator at [phone] should you have any questions or need assistance in obtaining follow up care.      Resources For Cancer Patients and their Caregivers ? American Cancer Society: Can assist with transportation, wigs, general needs, runs Look Good Feel Better.        1-888-227-6333 ? Cancer Care: Provides financial assistance, online support groups, medication/co-pay assistance.  1-800-813-HOPE (4673) ? [facility] Assists [place] cancer  patients and their families through emotional , educational and financial support.  336-427-4357 ? [place] DSS Where to apply for food stamps, Medicaid and utility assistance. 336-342-1394 ? [employer]: Transportation to medical appointments. 336-347-2287 ? Social Security Administration: May apply for disability if have a Stage IV cancer. 336-342-7796 1-800-772-1213 ? [place] [employer], Disability and Transit Services: Assists with nutrition, care and transit needs. 336-349-2343         

## 2017-02-11 NOTE — Progress Notes (Signed)
Labs reviewed with MD, proceed with treatment.  Treatment given per orders. Patient tolerated it well without problems. Vitals stable and discharged home from clinic ambulatory. Follow up as scheduled.  

## 2017-02-12 ENCOUNTER — Other Ambulatory Visit (HOSPITAL_COMMUNITY): Payer: Self-pay | Admitting: Emergency Medicine

## 2017-02-12 ENCOUNTER — Encounter: Payer: Self-pay | Admitting: Family Medicine

## 2017-02-12 ENCOUNTER — Ambulatory Visit (INDEPENDENT_AMBULATORY_CARE_PROVIDER_SITE_OTHER): Payer: Medicare HMO | Admitting: Family Medicine

## 2017-02-12 VITALS — BP 120/60 | HR 57 | Temp 98.1°F | Resp 16 | Ht 64.0 in | Wt 126.8 lb

## 2017-02-12 DIAGNOSIS — F320 Major depressive disorder, single episode, mild: Secondary | ICD-10-CM | POA: Diagnosis not present

## 2017-02-12 DIAGNOSIS — C259 Malignant neoplasm of pancreas, unspecified: Secondary | ICD-10-CM | POA: Diagnosis not present

## 2017-02-12 DIAGNOSIS — F419 Anxiety disorder, unspecified: Secondary | ICD-10-CM | POA: Diagnosis not present

## 2017-02-12 DIAGNOSIS — C787 Secondary malignant neoplasm of liver and intrahepatic bile duct: Secondary | ICD-10-CM

## 2017-02-12 DIAGNOSIS — R69 Illness, unspecified: Secondary | ICD-10-CM | POA: Diagnosis not present

## 2017-02-12 DIAGNOSIS — Z9181 History of falling: Secondary | ICD-10-CM

## 2017-02-12 DIAGNOSIS — I10 Essential (primary) hypertension: Secondary | ICD-10-CM

## 2017-02-12 MED ORDER — ERLOTINIB HCL 100 MG PO TABS: 100.0000 mg | ORAL_TABLET | Freq: Every day | ORAL | 0 refills | Status: DC

## 2017-02-12 NOTE — Patient Instructions (Addendum)
F/u in 4 monhs, call if you need ebefore  No changes  In medication that you are currently taking  You are doing  Very  Well  Be careful not to fall  Enjoy your family, friends and those games !!!  Thank you  for choosing [facility]. We consider it a privelige to serve you.  Delivering excellent health care in a caring and  compassionate way is our goal.  Partnering with you,  so that together we can achieve this goal is our strategy.

## 2017-02-18 ENCOUNTER — Encounter (HOSPITAL_BASED_OUTPATIENT_CLINIC_OR_DEPARTMENT_OTHER): Payer: Medicare HMO | Admitting: Oncology

## 2017-02-18 ENCOUNTER — Encounter (HOSPITAL_COMMUNITY): Payer: Self-pay | Admitting: Oncology

## 2017-02-18 VITALS — BP 139/58 | HR 50 | Temp 97.6°F | Resp 16 | Wt 123.4 lb

## 2017-02-18 DIAGNOSIS — C252 Malignant neoplasm of tail of pancreas: Secondary | ICD-10-CM | POA: Diagnosis not present

## 2017-02-18 DIAGNOSIS — C787 Secondary malignant neoplasm of liver and intrahepatic bile duct: Secondary | ICD-10-CM | POA: Diagnosis not present

## 2017-02-18 DIAGNOSIS — C259 Malignant neoplasm of pancreas, unspecified: Secondary | ICD-10-CM

## 2017-02-18 MED ORDER — ERLOTINIB HCL 100 MG PO TABS: 100.0000 mg | ORAL_TABLET | Freq: Every day | ORAL | 0 refills | Status: DC

## 2017-02-18 MED ORDER — DIPHENOXYLATE-ATROPINE 2.5-0.025 MG PO TABS: 1.0000 | ORAL_TABLET | Freq: Four times a day (QID) | ORAL | 1 refills | Status: DC | PRN

## 2017-02-18 NOTE — Progress Notes (Signed)
[place], [place] [address]   CLINIC:  Medical Oncology/Hematology  PCP:  Fayrene Helper, MD [address], [place] [address] [phone]   REASON FOR VISIT:  Follow-up for Stage IV pancreatic cancer with liver mets   CURRENT THERAPY: Gemzar/Erlotinib started on 01/28/17  BRIEF ONCOLOGIC HISTORY:    Pancreatic cancer metastasized to liver ([place])   03/10/2014 Initial Diagnosis    Pancreatic cancer metastasized to liver      03/22/2014 - 03/05/2016 Chemotherapy    Abraxane/Gemzar days 1, 8, every 28 days.  Day 15 was cancelled due to leukopenia and thrombocytopenia on day 15 cycle 1.      05/24/2014 Treatment Plan Change    Day 8 of cycle 3 is held with ANC of 1.1      06/05/2014 Imaging    CT C/A/P . Interval decrease in size of the pancreatic tail mass.Improved hepatic metastatic disease. No new lesions. No CT findings for metastatic disease involving the chest.      08/09/2014 Tumor Marker    CA 19-9= 33 (WNL)      10/26/2014 Imaging    MRI- Continued interval decrease in size of the hepatic metastatic lesions and no new lesions are identified. Continued decrease in size of the pancreatic tail lesion.      01/03/2015 Tumor Marker    Results for MELLONIE, GUESS (MRN [id number]) as of 01/18/2015 12:52  01/03/2015 10:00 CA 19-9: 14       01/17/2015 Imaging    MRI- Response to therapy of hepatic metastasis.  Similar size of a pancreatic tail lesion.      03/20/2015 Imaging    MRI-L spine- Severe disc space narrowing at L2-L3, with endplate reactive changes. Large disc extrusion into the ventral epidural space,central to the RIGHT with a cephalad migrated free fragment. Additional Large disc extrusion into the retroperitone...      03/22/2015 Imaging    CT pelvis- No evidence for metastatic disease within the pelvis.      07/24/2015 Imaging    MRI abd- Continued response to therapy, with no residual detectable  liver metastases. No new sites of metastatic disease in the abdomen.       08/15/2015 Code Status     She confirms desire for DNR status.      12/21/2015 Imaging    MRI liver- Severe image degradation due to motion artifact reducing diagnostic sensitivity and specificity. Reduce conspicuity of the pancreatic tail lesion suggesting further improvement. The original liver lesions have resolved.      03/05/2016 Treatment Plan Change    Chemotherapy holiday/Break after 2 years worth of treatment      04/07/2016 Imaging    CT chest- When compared to recent chest CT, new minimally displaced anterior left sixth rib fracture. Slight increase in subcarinal adenopathy      04/28/2016 Imaging    Bone density- BMD as determined from Femur Neck Right is 0.705 g/cm2 with a T-Score of -2.4. This patient is considered osteopenic according to [facility]) criteria. Compared with the prior study on 02/23/2013, the BMD of the lumbar spine/rt. femoral neck show a statistically significant decrease.       05/23/2016 Imaging    MRI abd- 1. Exam is significantly degraded by patient respiratory motion. Consider follow-up exams with CT abdomen with without contrast per pancreatic protocol. 2. Fullness in tail of pancreas is more prominent and potentially increased in size. Recommend close attention  on follow-up. (Consider CT as above) 3. No explanation for back pain.      09/11/2016 Imaging    MRI pancreas- Interval progression of pancreatic tail lesion. New differential perfusion right liver with areas of heterogeneity. Underlying metastatic disease in this region not excluded.      09/11/2016 Progression    MRi in conjunction with rising CA 19-9 are indicative of relapse of disease.      01/09/2017 Progression    CT C/A/P: 2.1 x 2.9 cm lesion in the pancreatic tail and abutting the splenic hilum, corresponding to known primary pancreatic neoplasm,  mildly increased.  Scattered small hypoenhancing lesions in the liver measuring up to 10 mm, approximately 8-10 in number, suspicious for hepatic metastases.  No findings specific for metastatic disease in the chest.  Additional ancillary findings as above.         INTERVAL HISTORY:  [name] 83 y.o. female returns for routine follow-up for metastatic pancreatic cancer.  Patient presents with her son today for continue follow-up of her pancreatic cancer. She started cycle 1 of Gemzar with erlotinib on 01/28/2017. She states that since she started she feels like she is tolerating this treatment better than her last one. She has diarrhea about every other day but starts taking Lomotil if she has more than 3 episodes a day. She states that she had one episode of nausea and vomiting after she smoked a cigarette but it has not happened since then. She has neuropathy on the bottom of her feet which is chronic and stable. She complains of fatigue. Her appetite is fair. She denies any pain.   REVIEW OF SYSTEMS:  Review of Systems  Constitutional: Positive for fatigue. Negative for chills and fever.  HENT:  Negative.   Eyes: Negative.   Respiratory: Negative.   Cardiovascular: Positive for leg swelling.  Gastrointestinal: Positive for diarrhea, nausea and vomiting. Negative for abdominal pain, blood in stool and constipation.  Endocrine: Negative.   Genitourinary: Negative.  Negative for dysuria and hematuria.   Musculoskeletal: Positive for arthralgias.       (R) shoulder "knot"   Skin: Negative.  Negative for rash.  Neurological: Positive for dizziness.  Hematological: Negative.   Psychiatric/Behavioral: Negative.      PAST MEDICAL/SURGICAL HISTORY:  Past Medical History:  Diagnosis Date  . Anemia due to antineoplastic chemotherapy 09/12/2015   Started Aranesp 500 mcg on 09/12/2015  . Anxiety   . Chronic diarrhea   . Depression   . DNR (do not resuscitate) 08/16/2015  .  Erosive esophagitis   . GERD (gastroesophageal reflux disease)   . Hx of adenomatous colonic polyps    tubular adenomas, last found in 2008  . Hyperplastic colon polyp 03/19/10   tcs by Dr. Gala Romney  . Hypertension 20 years   . Kidney stone    hx/ crushed   . Opioid contract exists 04/18/2015   With Dr. Moshe Cipro  . Pancreatic cancer ([place]) 02/2014  . Pancreatic cancer metastasized to liver ([place]) 03/10/2014  . Schatzki's ring 08/26/10   Last dilated on EGD by Dr. Trevor Iha [facility], linear gastric erosions, BI hemigastrectomy   Past Surgical History:  Procedure Laterality Date  . BREAST LUMPECTOMY Left   . bunion removal     from both feet   . CHOLECYSTECTOMY  1965   . COLONOSCOPY  03/19/2010   DR Gala Romney,, normal TI, pancolonic diverticula, random colon bx neg., hyperplastic polyps removed  . ESOPHAGOGASTRODUODENOSCOPY  11/22/2003   DR Gala Romney, erosive RE, Billroth  I  . ESOPHAGOGASTRODUODENOSCOPY  08/26/10   Dr. Gala Romney- moderate severe ERE, Scahtzki ring s/p dilation, Billroth I, linear gastric erosions, bx-gastric xanthelasma  . LEFT SHOULDER SURGERY  2009   DR HARRISON  . ORIF ANKLE FRACTURE Right 05/22/2013   Procedure: OPEN REDUCTION INTERNAL FIXATION (ORIF) RIGHT ANKLE FRACTURE;  Surgeon: Sanjuana Kava, MD;  Location: [facility];  Service: Orthopedics;  Laterality: Right;  . stomach ulcer  50 years ago    had some of her stomach removed      SOCIAL HISTORY:  Social History   Social History  . Marital status: Widowed    Spouse name: N/A  . Number of children: 2  . Years of education: N/A   Occupational History  . retired from Teacher, adult education Retired   Social History Main Topics  . Smoking status: Current Some Day Smoker    Packs/day: 0.50    Years: 60.00    Types: Cigarettes    Last attempt to quit: 09/09/2016  . Smokeless tobacco: Never Used  . Alcohol use No  . Drug use: No  . Sexual activity: No   Other Topics Concern  . Not on file   Social History Narrative  . No narrative on  file    FAMILY HISTORY:  Family History  Problem Relation Age of Onset  . Hypertension Mother   . Kidney failure Brother        on dialysis  . Diabetes Sister   . Diabetes Sister   . Hypertension Father   . Kidney failure Sister   . Kidney Stones Brother   . Breast cancer Son   . Gout Son   . Liver disease Neg Hx   . Colon cancer Neg Hx     CURRENT MEDICATIONS:  Outpatient Encounter Prescriptions as of 02/18/2017  Medication Sig Note  . acetaminophen (TYLENOL) 500 MG tablet Take 500 mg by mouth every 6 (six) hours as needed for mild pain or moderate pain.    Marland Kitchen amLODipine (NORVASC) 10 MG tablet TAKE 1 TABLET BY MOUTH EVERY DAY   . Calcium-Phosphorus-Vitamin D [phone] MG-MG-UNIT CHEW Chew 2 tablets by mouth daily.   . clonazePAM (KLONOPIN) 0.5 MG tablet One tablet once daily as needed for anxiety   . cycloSPORINE (RESTASIS) 0.05 % ophthalmic emulsion Place 1 drop into both eyes 2 (two) times daily.   . diphenhydrAMINE (BENADRYL) 50 MG tablet Take 50 mg (1 tab) one hour prior to scan (10 am on 8/31)   . diphenoxylate-atropine (LOMOTIL) 2.5-0.025 MG tablet Take 1 tablet by mouth 4 (four) times daily as needed for diarrhea or loose stools.   . erlotinib (TARCEVA) 100 MG tablet Take 1 tablet (100 mg total) by mouth daily. Take on an empty stomach 1 hour before meals or 2 hours after   . ferrous sulfate 325 (65 FE) MG tablet Take 325 mg by mouth daily with breakfast.   . Gemcitabine HCl (GEMZAR IV) Inject into the vein. Day 1,day 1 every 28 days   . hydrochlorothiazide (MICROZIDE) 12.5 MG capsule    . lidocaine-prilocaine (EMLA) cream APPLY A QUARTER SIZE AMOUNT TO PORT SITE 1 HOURS PRIOR TO CHEMO DO NOT RUB IN COVER WITH PLASTIC WRAP 10/05/2016: Received treatment on 10/02/2016  . meclizine (ANTIVERT) 12.5 MG tablet Take 1 tablet (12.5 mg total) by mouth 3 (three) times daily as needed for dizziness.   . metoprolol tartrate (LOPRESSOR) 25 MG tablet Take 1 tablet (25 mg total) by  mouth 2 (two) times daily.   Marland Kitchen  mirtazapine (REMERON) 7.5 MG tablet Take 1 tablet (7.5 mg total) by mouth at bedtime.   . Multiple Vitamins-Minerals (EQ ONE DAILY WOMENS 50+) TABS Take 1 tablet by mouth daily.   . pantoprazole (PROTONIX) 20 MG tablet TAKE 1 TABLET(20 MG) BY MOUTH DAILY   . potassium chloride (K-DUR) 10 MEQ tablet Take 1 tablet (10 mEq total) by mouth 2 (two) times daily.   . traMADol (ULTRAM) 50 MG tablet One tablet once daily as needed for uncontrolled  pain    Facility-Administered Encounter Medications as of 02/18/2017  Medication  . dexamethasone (DECADRON) 10 mg in sodium chloride 0.9 % 50 mL IVPB    ALLERGIES:  Allergies  Allergen Reactions  . Iohexol Hives and Shortness Of Breath    PATIENT HAD TO BE TAKEN TO ED, C/O WHELPS, HIVES, DIFFICULTY BREATHING. PATIENT GIVEN IV CONTRAST AFTER 13 HOUR PRE MEDS ON 01/09/2017 WITH NO REACTION NOTED  . Aciphex [Rabeprazole Sodium] Other (See Comments)    unknown  . Amlodipine Besylate-Valsartan     Rash to high-dose (5/320)  . Esomeprazole Magnesium   . Omeprazole Other (See Comments)    Patient states "medication didn't work"  . Ciprofloxacin Rash  . Penicillins Swelling and Rash    Has patient had a PCN reaction causing immediate rash, facial/tongue/throat swelling, SOB or lightheadedness with hypotension: Yes Has patient had a PCN reaction causing severe rash involving mucus membranes or skin necrosis: Yes Has patient had a PCN reaction that required hospitalization: No Has patient had a PCN reaction occurring within the last 10 years: yes If all of the above answers are "NO", then may proceed with Cephalosporin use.      PHYSICAL EXAM:  ECOG Performance status: 1-2 - Symptomatic, but largely independent; may require occasional assistance.   Vitals:   02/18/17 1030  BP: (!) 139/58  Pulse: (!) 50  Resp: 16  Temp: 97.6 F (36.4 C)  SpO2: 100%   Filed Weights   02/18/17 1030  Weight: 123 lb 6.4 oz (56 kg)       Physical Exam  Constitutional: She is oriented to person, place, and time.  Frail appearing female in no acute distress   HENT:  Head: Normocephalic.  Mouth/Throat: Oropharynx is clear and moist. No oropharyngeal exudate.  Eyes: Pupils are equal, round, and reactive to light. Conjunctivae are normal. No scleral icterus.  Neck: Normal range of motion. Neck supple.  Cardiovascular: Normal rate and regular rhythm.   Pulmonary/Chest: Effort normal. No respiratory distress.  Abdominal: Soft. Bowel sounds are normal. She exhibits no distension. There is no tenderness. There is no rebound.  Musculoskeletal: Normal range of motion. She exhibits no edema.  Lymphadenopathy:    She has no cervical adenopathy.  Neurological: She is alert and oriented to person, place, and time. No cranial nerve deficit. Gait normal.  Skin: Skin is warm and dry. No rash noted.  Psychiatric: Mood, memory, affect and judgment normal.  Nursing note and vitals reviewed.    LABORATORY DATA:  I have reviewed the labs as listed.  CBC    Component Value Date/Time   WBC 5.2 02/11/2017 1029   RBC 3.45 (L) 02/11/2017 1029   HGB 9.9 (L) 02/11/2017 1029   HCT 30.4 (L) 02/11/2017 1029   PLT 140 (L) 02/11/2017 1029   MCV 88.1 02/11/2017 1029   MCH 28.7 02/11/2017 1029   MCHC 32.6 02/11/2017 1029   RDW 16.8 (H) 02/11/2017 1029   LYMPHSABS 1.9 02/11/2017 1029   MONOABS 0.5  02/11/2017 1029   EOSABS 0.4 02/11/2017 1029   BASOSABS 0.1 02/11/2017 1029   CMP Latest Ref Rng & Units 02/11/2017 02/04/2017 01/28/2017  Glucose 65 - 99 mg/dL 94 94 92  BUN 6 - 20 mg/dL '19 19 18  ' Creatinine 0.44 - 1.00 mg/dL 1.03(H) 1.05(H) 0.96  Sodium 135 - 145 mmol/L 136 136 139  Potassium 3.5 - 5.1 mmol/L 3.9 3.7 4.0  Chloride 101 - 111 mmol/L 103 100(L) 103  CO2 22 - 32 mmol/L '26 26 28  ' Calcium 8.9 - 10.3 mg/dL 8.7(L) 8.9 8.7(L)  Total Protein 6.5 - 8.1 g/dL 7.5 7.4 7.3  Total Bilirubin 0.3 - 1.2 mg/dL 0.6 0.5 0.5  Alkaline Phos  38 - 126 U/L 137(H) 120 125  AST 15 - 41 U/L '23 21 21  ' ALT 14 - 54 U/L 15 15 12(L)      PENDING LABS:    DIAGNOSTIC IMAGING:  CT chest/abd/pelvis: 01/09/17 *Images and radiologic reports reviewed independently and I agree with below findings.  CLINICAL DATA:  Pancreatic cancer with suspected liver metastases  EXAM: CT CHEST, ABDOMEN, AND PELVIS WITH CONTRAST  TECHNIQUE: Multidetector CT imaging of the chest, abdomen and pelvis was performed following the standard protocol during bolus administration of intravenous contrast.  CONTRAST:  144m ISOVUE-300 IOPAMIDOL (ISOVUE-300) INJECTION 61%  COMPARISON:  MRI abdomen dated 09/11/2016. CT chest dated 04/07/2016. CT pelvis dated 03/22/2015.  FINDINGS: CT CHEST FINDINGS  Cardiovascular: Heart is normal in size.  No pericardial effusion.  No evidence of thoracic aortic aneurysm. Atherosclerotic calcifications of the aortic arch.  Three vessel coronary atherosclerosis.  Right chest port terminates at the cavoatrial junction.  Mediastinum/Nodes: Calcified low right paratracheal node (series 3/image 38), within normal limits.  No suspicious mediastinal, hilar, or axillary lymphadenopathy.  Visualized thyroid is enlarged/ heterogeneous with bilateral thyroid nodules measuring up to 13 mm on the right (series 3/ image 9).  Lungs/Pleura: 2 mm subpleural nodule in the left lower lobe (series 8/ image 41), likely benign.  No suspicious pulmonary nodules.  Mild centrilobular and paraseptal emphysematous changes, upper lobe predominant.  No focal consolidation.  Mild biapical pleural-parenchymal scarring.  No pleural effusion or pneumothorax.  Musculoskeletal: Mild degenerative changes of the thoracic spine.  CT ABDOMEN PELVIS FINDINGS  Hepatobiliary: Multiple scattered hypoenhancing lesions measuring less than 10 mm in the liver (for example, series 3/images 76, 87, 102, and 103), approximately  8-10 in number. These are suspicious for metastatic disease.  Status post cholecystectomy. Mild intrahepatic and extrahepatic ductal dilatation. Dilated common duct, measuring 12 mm, although smoothly tapering at the ampulla.  Pancreas: 2.1 x 2.9 cm heterogeneously enhancing lesion in the pancreatic tail abutting the splenic hilum (series 9/ image 58), corresponding to known primary pancreatic neoplasm. Although difficult to directly compare to prior MRI, this previously measured 1.7 x 2.6 cm.  Spleen: Heterogeneous perfusion of the spleen. 2.5 cm fluid density lesion in the central spleen (series 3/image 98).  Adrenals/Urinary Tract: Adrenal glands are within normal limits.  Numerous bilateral renal cysts of varying sizes and complexities, including a 2.5 cm hyperdense/hemorrhagic cyst along the posterior right upper kidney (series 3/image 90), benign (Bosniak II). Most of the cysts are simple (Bosniak I). No enhancing renal lesions. No hydronephrosis.  Bladder is mildly thick-walled although underdistended.  Stomach/Bowel: Stomach is within normal limits.  No evidence of bowel obstruction.  Appendix is not discretely visualized.  Mild left colonic stool burden.  Vascular/Lymphatic: No evidence of abdominal aortic aneurysm.  Atherosclerotic calcifications of the  abdominal aorta and branch vessels.  No suspicious abdominopelvic lymphadenopathy.  Reproductive: Uterus is unremarkable.  Bilateral ovaries are within normal limits.  Other: No abdominopelvic ascites.  Musculoskeletal: Degenerative changes of the lumbar spine, most prominent at L2-3.  IMPRESSION: 2.1 x 2.9 cm lesion in the pancreatic tail and abutting the splenic hilum, corresponding to known primary pancreatic neoplasm, mildly increased.  Scattered small hypoenhancing lesions in the liver measuring up to 10 mm, approximately 8-10 in number, suspicious for  hepatic metastases.  No findings specific for metastatic disease in the chest.  Additional ancillary findings as above.   Electronically Signed   By: Julian Hy M.D.   On: 01/09/2017 15:09       PATHOLOGY:  Liver biopsy: 03/09/14           ASSESSMENT & PLAN:   Stage IV pancreatic cancer with liver mets:  -Metastatic disease noted 03/10/14 with biopsy confirmed liver mets. Received chemotherapy with Gemzar/Abraxane on days 1 & 8 every 28 day cycle; CA 19-9 normalized and subsequent imaging revealed response and later no residual detectable liver mets in 07/24/15. Chemotherapy drug holiday started in 02/2016, after 2 years of chemotherapy. MRI on 09/11/16 indicates progression of pancreatic tail lesion & right liver with concern for recurrent metastatic disease. CA 19-9 was rising as well. -Recent restaging CT chest/abd/pelvis revealed mildly increased lesion in pancreatic tail, as well as scattered small liver lesions, approximately 8-10 in number, suspicious for worsening liver mets. No findings for metastatic disease in the chest.   -Tolerating Gemzar on days 1 & 8 (day 15 to be omitted given previous cytopenias with previous chemotherapy) + Tarceva 100 mg daily relatively well. -I have told the patient to start taking her imodium starting with the first episode of diarrhea and not to wait until she has 3 episodes. I also gave her an Rx for lomotil today. She is to take it if the imodium ever does not control her diarrhea. -Next cycle 2 scheduled for 02/25/17. -Plan to repeat restaging CT C/A/P after cycle 3.  -[place] One testing results. She could possibly qualify for Beryle Flock if she is MSI-High on [facility] One testing in the future if she progresses on this treatment.  Dispo:  -[facility] One ordered today on liver biopsy path from 2015 is still pending.  -Return to cancer center on 02/25/17 for next cycle 2 of gemzar with daily erlotinib. -RTC for  follow up with cycle 3 of chemo on 03/25/17.   All questions were answered to patient's stated satisfaction. Encouraged patient to call with any new concerns or questions before her next visit to the cancer center and we can certain see her sooner, if needed.    Twana First, MD

## 2017-02-18 NOTE — Patient Instructions (Signed)
[place] at [facility] Discharge Instructions  RECOMMENDATIONS MADE BY THE CONSULTANT AND ANY TEST RESULTS WILL BE SENT TO YOUR REFERRING PHYSICIAN.  You were seen today by Dr. Twana First Follow up in 5 weeks Continue chemo as scheduled.   Thank you for choosing [place] at [facility] to provide your oncology and hematology care.  To afford each patient quality time with our provider, please arrive at least 15 minutes before your scheduled appointment time.    If you have a lab appointment with the [facility] please come in thru the  Main Entrance and check in at the main information desk  You need to re-schedule your appointment should you arrive 10 or more minutes late.  We strive to give you quality time with our providers, and arriving late affects you and other patients whose appointments are after yours.  Also, if you no show three or more times for appointments you may be dismissed from the clinic at the providers discretion.     Again, thank you for choosing [facility].  Our hope is that these requests will decrease the amount of time that you wait before being seen by our physicians.       _____________________________________________________________  Should you have questions after your visit to [facility], please contact our office at [phone] between the hours of 8:30 a.m. and 4:30 p.m.  Voicemails left after 4:30 p.m. will not be returned until the following business day.  For prescription refill requests, have your pharmacy contact our office.       Resources For Cancer Patients and their Caregivers ? American Cancer Society: Can assist with transportation, wigs, general needs, runs Look Good Feel Better.        860-758-2260 ? Cancer Care: Provides financial assistance, online support groups, medication/co-pay assistance.  1-800-813-HOPE 684-237-0804) ? [employer] Assists [place] cancer patients and their families through emotional , educational and financial support.  331-057-0578 ? [place] DSS Where to apply for food stamps, Medicaid and utility assistance. 814-272-3621 ? [facility]: Transportation to medical appointments. 240 537 6635 ? Social Security Administration: May apply for disability if have a Stage IV cancer. 804-410-4958 671 199 7128 ? [employer], Disability and Transit Services: Assists with nutrition, care and transit needs. [place] Support Programs: @10RELATIVEDAYS @ > Cancer Support Group  2nd Tuesday of the month 1pm-2pm, Journey Room  > Creative Journey  3rd Tuesday of the month 1130am-1pm, Journey Room  > Look Good Feel Better  1st Wednesday of the month 10am-12 noon, Journey Room (Call [place] to register [phone])

## 2017-02-21 ENCOUNTER — Encounter: Payer: Self-pay | Admitting: Family Medicine

## 2017-02-21 NOTE — Progress Notes (Signed)
   [name]     MRN: [id number]      DOB: Jan 05, 1929   HPI [name] is here for follow up and re-evaluation of chronic medical conditions specifically hypertension, medication management and review of any available recent lab and radiology data.  Preventive health is updated, specifically  Cancer screening and Immunization.   Questions or concerns regarding consultations or procedures which the [name] has had in the interim are  addressed. The [name] denies any adverse reactions to current medications since the last visit.  There are no new concerns.  There are no specific complaints   ROS Denies recent fever or chills. Denies sinus pressure, nasal congestion, ear pain or sore throat. Denies chest congestion, productive cough or wheezing. Denies chest pains, palpitations and leg swelling Denies abdominal pain, nausea, vomiting,diarrhea or constipation.   Denies dysuria, frequency, hesitancy or incontinence. Denies uncontrolled  joint pain, swelling and limitation in mobility. Denies headaches, seizures, numbness, or tingling. Denies uncontrolled  depression, anxiety or insomnia. Denies skin break down or rash.   PE  BP 120/60 (BP Location: Right Arm, Patient Position: Sitting, Cuff Size: Normal)   Pulse (!) 57   Temp 98.1 F (36.7 C) (Other (Comment))   Resp 16   Ht 5\' 4"  (1.626 m)   Wt 126 lb 12 oz (57.5 kg)   SpO2 97%   BMI 21.76 kg/m   Patient alert and oriented and in no cardiopulmonary distress.  HEENT: No facial asymmetry, EOMI,   oropharynx pink and moist.  Neck supple no JVD, no mass.  Chest: Clear to auscultation bilaterally.  CVS: S1, S2 no murmurs, no S3.Regular rate.  ABD: Soft non tender.   Ext: No edema  MS: Adequate though reduced ROM spine, shoulders, hips and knees.  Skin: Intact, no ulcerations or rash noted.  Psych: Good eye contact, normal affect. Memory intact not anxious or depressed appearing.  CNS: CN 2-12 intact, power,  normal  throughout.no focal deficits noted.   Assessment & Plan  Essential hypertension Controlled, no change in medication DASH diet and commitment to daily physical activity for a minimum of 30 minutes discussed and encouraged, as a part of hypertension management. The importance of attaining a healthy weight is also discussed.  BP/Weight 02/18/2017 02/12/2017 02/11/2017 02/04/2017 01/28/2017 01/14/2017 08/07/9240  Systolic BP 683 419 622 297 989 211 941  Diastolic BP 58 60 59 55 56 54 60  Wt. (Lbs) 123.4 126.75 126 126.4 127.4 129 129  BMI 21.18 21.76 21.63 21.7 21.87 22.14 22.14       At high risk for falls Home safety and fall risk reduction reviewed briefly at visit, no falls since last visit  Depression (emotion) Well, continue remerom  Anxiety Controlled ,  No overt and uncontrolled anxiety  Pancreatic cancer metastasized to liver [facility]) Treatment by oncology, patient very involved in staging and extent of disease

## 2017-02-21 NOTE — Assessment & Plan Note (Signed)
Home safety and fall risk reduction reviewed briefly at visit, no falls since last visit

## 2017-02-21 NOTE — Assessment & Plan Note (Signed)
Well, continue remerom

## 2017-02-21 NOTE — Assessment & Plan Note (Signed)
Treatment by oncology, patient very involved in staging and extent of disease

## 2017-02-21 NOTE — Assessment & Plan Note (Signed)
Controlled, no change in medication DASH diet and commitment to daily physical activity for a minimum of 30 minutes discussed and encouraged, as a part of hypertension management. The importance of attaining a healthy weight is also discussed.  BP/Weight 02/18/2017 02/12/2017 02/11/2017 02/04/2017 01/28/2017 01/14/2017 01/26/9149  Systolic BP 569 794 801 655 374 827 078  Diastolic BP 58 60 59 55 56 54 60  Wt. (Lbs) 123.4 126.75 126 126.4 127.4 129 129  BMI 21.18 21.76 21.63 21.7 21.87 22.14 22.14

## 2017-02-21 NOTE — Assessment & Plan Note (Signed)
Controlled ,  No overt and uncontrolled anxiety

## 2017-02-25 ENCOUNTER — Encounter (HOSPITAL_COMMUNITY): Payer: Self-pay

## 2017-02-25 ENCOUNTER — Encounter (HOSPITAL_BASED_OUTPATIENT_CLINIC_OR_DEPARTMENT_OTHER): Payer: Medicare HMO

## 2017-02-25 VITALS — BP 165/54 | HR 56 | Temp 97.7°F | Resp 18 | Wt 125.0 lb

## 2017-02-25 DIAGNOSIS — R69 Illness, unspecified: Secondary | ICD-10-CM | POA: Diagnosis not present

## 2017-02-25 DIAGNOSIS — Z5111 Encounter for antineoplastic chemotherapy: Secondary | ICD-10-CM | POA: Diagnosis not present

## 2017-02-25 DIAGNOSIS — Z9221 Personal history of antineoplastic chemotherapy: Secondary | ICD-10-CM | POA: Diagnosis not present

## 2017-02-25 DIAGNOSIS — G8929 Other chronic pain: Secondary | ICD-10-CM | POA: Diagnosis not present

## 2017-02-25 DIAGNOSIS — C787 Secondary malignant neoplasm of liver and intrahepatic bile duct: Secondary | ICD-10-CM

## 2017-02-25 DIAGNOSIS — Z66 Do not resuscitate: Secondary | ICD-10-CM | POA: Diagnosis not present

## 2017-02-25 DIAGNOSIS — C259 Malignant neoplasm of pancreas, unspecified: Secondary | ICD-10-CM | POA: Diagnosis not present

## 2017-02-25 DIAGNOSIS — C252 Malignant neoplasm of tail of pancreas: Secondary | ICD-10-CM | POA: Diagnosis not present

## 2017-02-25 DIAGNOSIS — E876 Hypokalemia: Secondary | ICD-10-CM | POA: Diagnosis not present

## 2017-02-25 DIAGNOSIS — M549 Dorsalgia, unspecified: Secondary | ICD-10-CM | POA: Diagnosis not present

## 2017-02-25 DIAGNOSIS — R350 Frequency of micturition: Secondary | ICD-10-CM | POA: Diagnosis not present

## 2017-02-25 LAB — COMPREHENSIVE METABOLIC PANEL
ALK PHOS: 145 U/L — AB (ref 38–126)
ALT: 15 U/L (ref 14–54)
AST: 24 U/L (ref 15–41)
Albumin: 3.4 g/dL — ABNORMAL LOW (ref 3.5–5.0)
Anion gap: 8 (ref 5–15)
BILIRUBIN TOTAL: 0.8 mg/dL (ref 0.3–1.2)
BUN: 20 mg/dL (ref 6–20)
CALCIUM: 8.8 mg/dL — AB (ref 8.9–10.3)
CO2: 28 mmol/L (ref 22–32)
CREATININE: 1.02 mg/dL — AB (ref 0.44–1.00)
Chloride: 104 mmol/L (ref 101–111)
GFR, EST AFRICAN AMERICAN: 56 mL/min — AB (ref >60)
GFR, EST NON AFRICAN AMERICAN: 48 mL/min — AB (ref >60)
Glucose, Bld: 106 mg/dL — ABNORMAL HIGH (ref 65–99)
Potassium: 3.6 mmol/L (ref 3.5–5.1)
Sodium: 140 mmol/L (ref 135–145)
Total Protein: 7.6 g/dL (ref 6.5–8.1)

## 2017-02-25 LAB — CBC WITH DIFFERENTIAL/PLATELET
Basophils Absolute: 0 10*3/uL (ref 0.0–0.1)
Basophils Relative: 0 %
EOS ABS: 0.3 10*3/uL (ref 0.0–0.7)
Eosinophils Relative: 6 %
HEMATOCRIT: 31.5 % — AB (ref 36.0–46.0)
HEMOGLOBIN: 10.3 g/dL — AB (ref 12.0–15.0)
LYMPHS ABS: 1.6 10*3/uL (ref 0.7–4.0)
LYMPHS PCT: 28 %
MCH: 28.9 pg (ref 26.0–34.0)
MCHC: 32.7 g/dL (ref 30.0–36.0)
MCV: 88.2 fL (ref 78.0–100.0)
MONOS PCT: 13 %
Monocytes Absolute: 0.7 10*3/uL (ref 0.1–1.0)
NEUTROS ABS: 3 10*3/uL (ref 1.7–7.7)
NEUTROS PCT: 53 %
Platelets: 131 10*3/uL — ABNORMAL LOW (ref 150–400)
RBC: 3.57 MIL/uL — AB (ref 3.87–5.11)
RDW: 17.2 % — ABNORMAL HIGH (ref 11.5–15.5)
WBC: 5.6 10*3/uL (ref 4.0–10.5)

## 2017-02-25 MED ORDER — GEMCITABINE HCL CHEMO INJECTION 1 GM/26.3ML
800.0000 mg/m2 | Freq: Once | INTRAVENOUS | Status: AC
  Administered 2017-02-25: 1292 mg via INTRAVENOUS
  Filled 2017-02-25: qty 23.46

## 2017-02-25 MED ORDER — SODIUM CHLORIDE 0.9% FLUSH
10.0000 mL | INTRAVENOUS | Status: DC | PRN
  Administered 2017-02-25: 10 mL
  Filled 2017-02-25: qty 10

## 2017-02-25 MED ORDER — SODIUM CHLORIDE 0.9 % IV SOLN
Freq: Once | INTRAVENOUS | Status: AC
  Administered 2017-02-25: 11:00:00 via INTRAVENOUS

## 2017-02-25 MED ORDER — PROCHLORPERAZINE MALEATE 10 MG PO TABS
10.0000 mg | ORAL_TABLET | Freq: Once | ORAL | Status: AC
  Administered 2017-02-25: 10 mg via ORAL

## 2017-02-25 MED ORDER — PROCHLORPERAZINE MALEATE 10 MG PO TABS
ORAL_TABLET | ORAL | Status: AC
Start: 2017-02-25 — End: 2017-02-25
  Filled 2017-02-25: qty 1

## 2017-02-25 MED ORDER — HEPARIN SOD (PORK) LOCK FLUSH 100 UNIT/ML IV SOLN
500.0000 [IU] | Freq: Once | INTRAVENOUS | Status: AC | PRN
  Administered 2017-02-25: 500 [IU]
  Filled 2017-02-25: qty 5

## 2017-02-25 MED ORDER — DIPHENOXYLATE-ATROPINE 2.5-0.025 MG PO TABS: 1.0000 | ORAL_TABLET | Freq: Four times a day (QID) | ORAL | 1 refills | Status: DC | PRN

## 2017-02-25 NOTE — Progress Notes (Signed)
Labs reviewed with Dr. Talbert Cage and ok to treat today. Patient stated Tarceva doing ok.  PRN diarrhea but the imodium managed symptoms per the patient.  Patient sent with prescription of Lomotil today to fill for management.    Patient tolerated chemotherapy with no complaints voiced.  Port site flushed per protocol.  Port site clean and dry with no bruising or swelling noted at site.  Band aid applied.  VSS with discharge and left ambulatory with family.  No s/s of distress noted.

## 2017-02-25 NOTE — Patient Instructions (Signed)
[facility] Discharge Instructions for Patients Receiving Chemotherapy  Today you received the following chemotherapy agents gemzar today.   To help prevent nausea and vomiting after your treatment, we encourage you to take your nausea medication.    If you develop nausea and vomiting that is not controlled by your nausea medication, call the clinic.   BELOW ARE SYMPTOMS THAT SHOULD BE REPORTED IMMEDIATELY:  *FEVER GREATER THAN 100.5 F  *CHILLS WITH OR WITHOUT FEVER  NAUSEA AND VOMITING THAT IS NOT CONTROLLED WITH YOUR NAUSEA MEDICATION  *UNUSUAL SHORTNESS OF BREATH  *UNUSUAL BRUISING OR BLEEDING  TENDERNESS IN MOUTH AND THROAT WITH OR WITHOUT PRESENCE OF ULCERS  *URINARY PROBLEMS  *BOWEL PROBLEMS  UNUSUAL RASH Items with * indicate a potential emergency and should be followed up as soon as possible.  Feel free to call the clinic should you have any questions or concerns. The clinic phone number is [phone].  Please show the [place] at check-in to the Emergency Department and triage nurse.

## 2017-02-26 ENCOUNTER — Other Ambulatory Visit (HOSPITAL_COMMUNITY): Payer: Self-pay | Admitting: Oncology

## 2017-02-26 LAB — CANCER ANTIGEN 19-9: CA 19-9: 109 U/mL — ABNORMAL HIGH (ref 0–35)

## 2017-03-04 ENCOUNTER — Encounter (HOSPITAL_COMMUNITY): Payer: Self-pay

## 2017-03-04 ENCOUNTER — Encounter (HOSPITAL_COMMUNITY): Payer: Medicare HMO | Attending: Oncology

## 2017-03-04 DIAGNOSIS — C252 Malignant neoplasm of tail of pancreas: Secondary | ICD-10-CM | POA: Diagnosis not present

## 2017-03-04 DIAGNOSIS — C787 Secondary malignant neoplasm of liver and intrahepatic bile duct: Secondary | ICD-10-CM | POA: Diagnosis not present

## 2017-03-04 DIAGNOSIS — C259 Malignant neoplasm of pancreas, unspecified: Secondary | ICD-10-CM | POA: Diagnosis not present

## 2017-03-04 LAB — CBC WITH DIFFERENTIAL/PLATELET
BASOS ABS: 0 10*3/uL (ref 0.0–0.1)
Basophils Relative: 1 %
EOS ABS: 0 10*3/uL (ref 0.0–0.7)
Eosinophils Relative: 2 %
HCT: 26.8 % — ABNORMAL LOW (ref 36.0–46.0)
Hemoglobin: 9 g/dL — ABNORMAL LOW (ref 12.0–15.0)
LYMPHS ABS: 1.5 10*3/uL (ref 0.7–4.0)
Lymphocytes Relative: 67 %
MCH: 29.1 pg (ref 26.0–34.0)
MCHC: 33.6 g/dL (ref 30.0–36.0)
MCV: 86.7 fL (ref 78.0–100.0)
MONO ABS: 0.2 10*3/uL (ref 0.1–1.0)
Monocytes Relative: 8 %
NEUTROS ABS: 0.5 10*3/uL — AB (ref 1.7–7.7)
Neutrophils Relative %: 22 %
PLATELETS: 123 10*3/uL — AB (ref 150–400)
RBC: 3.09 MIL/uL — AB (ref 3.87–5.11)
RDW: 17 % — AB (ref 11.5–15.5)
WBC: 2.2 10*3/uL — AB (ref 4.0–10.5)

## 2017-03-04 LAB — COMPREHENSIVE METABOLIC PANEL
ALBUMIN: 3.1 g/dL — AB (ref 3.5–5.0)
ALT: 16 U/L (ref 14–54)
ANION GAP: 9 (ref 5–15)
AST: 22 U/L (ref 15–41)
Alkaline Phosphatase: 133 U/L — ABNORMAL HIGH (ref 38–126)
BILIRUBIN TOTAL: 0.9 mg/dL (ref 0.3–1.2)
BUN: 20 mg/dL (ref 6–20)
CHLORIDE: 105 mmol/L (ref 101–111)
CO2: 28 mmol/L (ref 22–32)
Calcium: 8.7 mg/dL — ABNORMAL LOW (ref 8.9–10.3)
Creatinine, Ser: 1.08 mg/dL — ABNORMAL HIGH (ref 0.44–1.00)
GFR calc Af Amer: 52 mL/min — ABNORMAL LOW (ref >60)
GFR calc non Af Amer: 45 mL/min — ABNORMAL LOW (ref >60)
GLUCOSE: 93 mg/dL (ref 65–99)
POTASSIUM: 3.8 mmol/L (ref 3.5–5.1)
SODIUM: 142 mmol/L (ref 135–145)
TOTAL PROTEIN: 6.9 g/dL (ref 6.5–8.1)

## 2017-03-04 MED ORDER — HEPARIN SOD (PORK) LOCK FLUSH 100 UNIT/ML IV SOLN
500.0000 [IU] | Freq: Once | INTRAVENOUS | Status: AC
  Administered 2017-03-04: 500 [IU] via INTRAVENOUS
  Filled 2017-03-04: qty 5

## 2017-03-04 MED ORDER — SODIUM CHLORIDE 0.9% FLUSH
10.0000 mL | Freq: Once | INTRAVENOUS | Status: AC
  Administered 2017-03-04: 10 mL via INTRAVENOUS

## 2017-03-04 NOTE — Patient Instructions (Signed)
[place] [facility]  Discharge Instructions:  Your white cells were too low for treatment today.  Follow neutropenia precautions as directed and return next week for labs and chemotherapy.  Call for any fevers or chills or any signs of an infection.   _______________________________________________________________  Thank you for choosing [facility] to provide your oncology and hematology care.  To afford each patient quality time with our providers, please arrive at least 15 minutes before your scheduled appointment.  You need to re-schedule your appointment if you arrive 10 or more minutes late.  We strive to give you quality time with our providers, and arriving late affects you and other patients whose appointments are after yours.  Also, if you no show three or more times for appointments you may be dismissed from the clinic.  Again, thank you for choosing [facility] hope is that these requests will allow you access to exceptional care and in a timely manner. _______________________________________________________________  If you have questions after your visit, please contact our office at [phone] between the hours of 8:30 a.m. and 5:00 p.m. Voicemails left after 4:30 p.m. will not be returned until the following business day. _______________________________________________________________  For prescription refill requests, have your pharmacy contact our office. _______________________________________________________________  Recommendations made by the consultant and any test results will be sent to your referring physician. _______________________________________________________________

## 2017-03-04 NOTE — Progress Notes (Signed)
To treatment room for labs and chemotherapy.  No complaints voiced today.  No changes in neuropathy.  Able to use zippers, jar lids, and pick up money change.  No changes in ADL's.  Family at side.   Labs reviewed with Dr. Talbert Cage abs neutrophils 0.5 today and holding treatment and bring back next week.  Pharmacy notified.  Reviewed neutropenia precautions with the patient and family with understanding verbalized. Port flushed per protocol with no complaints of pain.  site clean and dry with no bruising or swelling noted at site.  Band aid applied.  VSS with discharge and left ambulatory with family.

## 2017-03-11 ENCOUNTER — Encounter (HOSPITAL_BASED_OUTPATIENT_CLINIC_OR_DEPARTMENT_OTHER): Payer: Medicare HMO

## 2017-03-11 ENCOUNTER — Encounter (HOSPITAL_COMMUNITY): Payer: Self-pay

## 2017-03-11 VITALS — BP 181/52 | HR 70 | Temp 97.8°F | Resp 16 | Wt 127.4 lb

## 2017-03-11 DIAGNOSIS — C252 Malignant neoplasm of tail of pancreas: Secondary | ICD-10-CM

## 2017-03-11 DIAGNOSIS — Z9221 Personal history of antineoplastic chemotherapy: Secondary | ICD-10-CM | POA: Diagnosis not present

## 2017-03-11 DIAGNOSIS — C787 Secondary malignant neoplasm of liver and intrahepatic bile duct: Secondary | ICD-10-CM

## 2017-03-11 DIAGNOSIS — C259 Malignant neoplasm of pancreas, unspecified: Secondary | ICD-10-CM | POA: Diagnosis not present

## 2017-03-11 DIAGNOSIS — E876 Hypokalemia: Secondary | ICD-10-CM | POA: Diagnosis not present

## 2017-03-11 DIAGNOSIS — Z5111 Encounter for antineoplastic chemotherapy: Secondary | ICD-10-CM | POA: Diagnosis not present

## 2017-03-11 DIAGNOSIS — G8929 Other chronic pain: Secondary | ICD-10-CM | POA: Diagnosis not present

## 2017-03-11 DIAGNOSIS — Z66 Do not resuscitate: Secondary | ICD-10-CM | POA: Diagnosis not present

## 2017-03-11 DIAGNOSIS — R350 Frequency of micturition: Secondary | ICD-10-CM | POA: Diagnosis not present

## 2017-03-11 DIAGNOSIS — R69 Illness, unspecified: Secondary | ICD-10-CM | POA: Diagnosis not present

## 2017-03-11 DIAGNOSIS — M549 Dorsalgia, unspecified: Secondary | ICD-10-CM | POA: Diagnosis not present

## 2017-03-11 LAB — CBC WITH DIFFERENTIAL/PLATELET
BASOS PCT: 0 %
Basophils Absolute: 0 10*3/uL (ref 0.0–0.1)
EOS ABS: 0.2 10*3/uL (ref 0.0–0.7)
EOS PCT: 4 %
HCT: 28.7 % — ABNORMAL LOW (ref 36.0–46.0)
Hemoglobin: 9.7 g/dL — ABNORMAL LOW (ref 12.0–15.0)
LYMPHS ABS: 1.4 10*3/uL (ref 0.7–4.0)
Lymphocytes Relative: 30 %
MCH: 29.3 pg (ref 26.0–34.0)
MCHC: 33.8 g/dL (ref 30.0–36.0)
MCV: 86.7 fL (ref 78.0–100.0)
MONO ABS: 0.3 10*3/uL (ref 0.1–1.0)
MONOS PCT: 7 %
NEUTROS PCT: 59 %
Neutro Abs: 2.7 10*3/uL (ref 1.7–7.7)
PLATELETS: 126 10*3/uL — AB (ref 150–400)
RBC: 3.31 MIL/uL — ABNORMAL LOW (ref 3.87–5.11)
RDW: 18.3 % — AB (ref 11.5–15.5)
WBC: 4.6 10*3/uL (ref 4.0–10.5)

## 2017-03-11 LAB — COMPREHENSIVE METABOLIC PANEL
ALK PHOS: 140 U/L — AB (ref 38–126)
ALT: 14 U/L (ref 14–54)
AST: 19 U/L (ref 15–41)
Albumin: 3.4 g/dL — ABNORMAL LOW (ref 3.5–5.0)
Anion gap: 8 (ref 5–15)
BUN: 21 mg/dL — AB (ref 6–20)
CALCIUM: 8.8 mg/dL — AB (ref 8.9–10.3)
CO2: 28 mmol/L (ref 22–32)
CREATININE: 0.94 mg/dL (ref 0.44–1.00)
Chloride: 101 mmol/L (ref 101–111)
GFR calc non Af Amer: 53 mL/min — ABNORMAL LOW (ref >60)
GLUCOSE: 92 mg/dL (ref 65–99)
Potassium: 4 mmol/L (ref 3.5–5.1)
SODIUM: 137 mmol/L (ref 135–145)
Total Bilirubin: 1 mg/dL (ref 0.3–1.2)
Total Protein: 7 g/dL (ref 6.5–8.1)

## 2017-03-11 MED ORDER — SODIUM CHLORIDE 0.9 % IV SOLN
Freq: Once | INTRAVENOUS | Status: AC
  Administered 2017-03-11: 11:00:00 via INTRAVENOUS

## 2017-03-11 MED ORDER — SODIUM CHLORIDE 0.9% FLUSH
10.0000 mL | INTRAVENOUS | Status: DC | PRN
  Administered 2017-03-11: 10 mL
  Filled 2017-03-11: qty 10

## 2017-03-11 MED ORDER — PROCHLORPERAZINE MALEATE 10 MG PO TABS
ORAL_TABLET | ORAL | Status: AC
  Filled 2017-03-11: qty 1

## 2017-03-11 MED ORDER — HEPARIN SOD (PORK) LOCK FLUSH 100 UNIT/ML IV SOLN
500.0000 [IU] | Freq: Once | INTRAVENOUS | Status: AC | PRN
  Administered 2017-03-11: 500 [IU]
  Filled 2017-03-11: qty 5

## 2017-03-11 MED ORDER — SODIUM CHLORIDE 0.9 % IV SOLN
800.0000 mg/m2 | Freq: Once | INTRAVENOUS | Status: AC
  Administered 2017-03-11: 1292 mg via INTRAVENOUS
  Filled 2017-03-11: qty 23.48

## 2017-03-11 MED ORDER — PROCHLORPERAZINE MALEATE 10 MG PO TABS
10.0000 mg | ORAL_TABLET | Freq: Once | ORAL | Status: AC
  Administered 2017-03-11: 10 mg via ORAL

## 2017-03-11 NOTE — Progress Notes (Signed)
Labs reviewed with MD. Proceed with treatment.  Marland Kitchentxd

## 2017-03-11 NOTE — Patient Instructions (Signed)
[facility] Discharge Instructions for Patients Receiving Chemotherapy   Beginning January 23rd 2017 lab work for the Cancer Center will be done in the  Main lab at  on 1st floor. If you have a lab appointment with the Cancer Center please come in thru the  Main Entrance and check in at the main information desk   Today you received the following chemotherapy agents   To help prevent nausea and vomiting after your treatment, we encourage you to take your nausea medication     If you develop nausea and vomiting, or diarrhea that is not controlled by your medication, call the clinic.  The clinic phone number is [phone]. Office hours are Monday-Friday 8:30am-5:00pm.  BELOW ARE SYMPTOMS THAT SHOULD BE REPORTED IMMEDIATELY:  *FEVER GREATER THAN 101.0 F  *CHILLS WITH OR WITHOUT FEVER  NAUSEA AND VOMITING THAT IS NOT CONTROLLED WITH YOUR NAUSEA MEDICATION  *UNUSUAL SHORTNESS OF BREATH  *UNUSUAL BRUISING OR BLEEDING  TENDERNESS IN MOUTH AND THROAT WITH OR WITHOUT PRESENCE OF ULCERS  *URINARY PROBLEMS  *BOWEL PROBLEMS  UNUSUAL RASH Items with * indicate a potential emergency and should be followed up as soon as possible. If you have an emergency after office hours please contact your primary care physician or go to the nearest emergency department.  Please call the clinic during office hours if you have any questions or concerns.   You may also contact the Patient Navigator at [phone] should you have any questions or need assistance in obtaining follow up care.      Resources For Cancer Patients and their Caregivers ? American Cancer Society: Can assist with transportation, wigs, general needs, runs Look Good Feel Better.        1-888-227-6333 ? Cancer Care: Provides financial assistance, online support groups, medication/co-pay assistance.  1-800-813-HOPE (4673) ? [facility] Assists [place] cancer  patients and their families through emotional , educational and financial support.  336-427-4357 ? [place] DSS Where to apply for food stamps, Medicaid and utility assistance. 336-342-1394 ? [facility]: Transportation to medical appointments. 336-347-2287 ? Social Security Administration: May apply for disability if have a Stage IV cancer. 336-342-7796 1-800-772-1213 ? [place] [facility]ging, Disability and Transit Services: Assists with nutrition, care and transit needs. 336-349-2343         

## 2017-03-14 ENCOUNTER — Other Ambulatory Visit: Payer: Self-pay | Admitting: Family Medicine

## 2017-03-17 ENCOUNTER — Other Ambulatory Visit (HOSPITAL_COMMUNITY): Payer: Self-pay | Admitting: Emergency Medicine

## 2017-03-17 MED ORDER — ERLOTINIB HCL 100 MG PO TABS
100.0000 mg | ORAL_TABLET | Freq: Every day | ORAL | 0 refills | Status: DC
Start: 2017-03-17 — End: 2017-04-17

## 2017-03-20 DIAGNOSIS — H04123 Dry eye syndrome of bilateral lacrimal glands: Secondary | ICD-10-CM | POA: Diagnosis not present

## 2017-03-20 DIAGNOSIS — H16223 Keratoconjunctivitis sicca, not specified as Sjogren's, bilateral: Secondary | ICD-10-CM | POA: Diagnosis not present

## 2017-03-24 NOTE — Progress Notes (Signed)
[place], [place] [address]   CLINIC:  Medical Oncology/Hematology  PCP:  [name] Helper, MD [address], [address] [place] [address] [phone]   REASON FOR VISIT:  Follow-up for Stage IV pancreatic cancer with liver mets   CURRENT THERAPY: Gemzar on days 1 & 8, (day 15 to be omitted given previous cytopenias with previous chemotherapy) + Tarceva 100 mg daily; beginning 01/28/17   BRIEF ONCOLOGIC HISTORY:    Pancreatic cancer metastasized to liver ([facility])   03/10/2014 Initial Diagnosis    Pancreatic cancer metastasized to liver      03/22/2014 - 03/05/2016 Chemotherapy    Abraxane/Gemzar days 1, 8, every 28 days.  Day 15 was cancelled due to leukopenia and thrombocytopenia on day 15 cycle 1.      05/24/2014 Treatment Plan Change    Day 8 of cycle 3 is held with ANC of 1.1      06/05/2014 Imaging    CT C/A/P . Interval decrease in size of the pancreatic tail mass.Improved hepatic metastatic disease. No new lesions. No CT findings for metastatic disease involving the chest.      08/09/2014 Tumor Marker    CA 19-9= 33 (WNL)      10/26/2014 Imaging    MRI- Continued interval decrease in size of the hepatic metastatic lesions and no new lesions are identified. Continued decrease in size of the pancreatic tail lesion.      01/03/2015 Tumor Marker    Results for [name], [name] (MRN [id number]) as of 01/18/2015 12:52  01/03/2015 10:00 CA 19-9: 14       01/17/2015 Imaging    MRI- Response to therapy of hepatic metastasis.  Similar size of a pancreatic tail lesion.      03/20/2015 Imaging    MRI-L spine- Severe disc space narrowing at L2-L3, with endplate reactive changes. Large disc extrusion into the ventral epidural space,central to the RIGHT with a cephalad migrated free fragment. Additional Large disc extrusion into the retroperitone...      03/22/2015 Imaging    CT pelvis- No evidence for metastatic disease within the  pelvis.      07/24/2015 Imaging    MRI abd- Continued response to therapy, with no residual detectable liver metastases. No new sites of metastatic disease in the abdomen.       08/15/2015 Code Status     She confirms desire for DNR status.      12/21/2015 Imaging    MRI liver- Severe image degradation due to motion artifact reducing diagnostic sensitivity and specificity. Reduce conspicuity of the pancreatic tail lesion suggesting further improvement. The original liver lesions have resolved.      03/05/2016 Treatment Plan Change    Chemotherapy holiday/Break after 2 years worth of treatment      04/07/2016 Imaging    CT chest- When compared to recent chest CT, new minimally displaced anterior left sixth rib fracture. Slight increase in subcarinal adenopathy      04/28/2016 Imaging    Bone density- BMD as determined from Femur Neck Right is 0.705 g/cm2 with a T-Score of -2.4. This patient is considered osteopenic according to [facility]) criteria. Compared with the prior study on 02/23/2013, the BMD of the lumbar spine/rt. femoral neck show a statistically significant decrease.       05/23/2016 Imaging    MRI abd- 1. Exam is significantly degraded by patient respiratory motion. Consider follow-up exams with CT abdomen with without  contrast per pancreatic protocol. 2. Fullness in tail of pancreas is more prominent and potentially increased in size. Recommend close attention on follow-up. (Consider CT as above) 3. No explanation for back pain.      09/11/2016 Imaging    MRI pancreas- Interval progression of pancreatic tail lesion. New differential perfusion right liver with areas of heterogeneity. Underlying metastatic disease in this region not excluded.      09/11/2016 Progression    MRi in conjunction with rising CA 19-9 are indicative of relapse of disease.      01/09/2017 Progression    CT C/A/P: 2.1 x 2.9 cm lesion in the pancreatic tail and  abutting the splenic hilum, corresponding to known primary pancreatic neoplasm, mildly increased.  Scattered small hypoenhancing lesions in the liver measuring up to 10 mm, approximately 8-10 in number, suspicious for hepatic metastases.  No findings specific for metastatic disease in the chest.  Additional ancillary findings as above.         INTERVAL HISTORY:  [name] 83 y.o. female returns for routine follow-up for metastatic pancreatic cancer.  Due for next cycle of Gemcitabine today.   Here today unaccompanied; seen in chemo infusion area.  Overall, she tells me she has been feeling well. Appetite and energy levels both 75%.    Remains on Tarceva; she is taking it as prescribed and denies any missed doses.  She states that she is tolerating it well; denies any rash. She has intermittent diarrhea, which is controlled by OTC Imodium as needed.  She then has some constipation after taking Imodium.  Denies N&V.  Her legs and back are sore at times; she takes Tylenol and sits in a warm bath which are both helpful.  Denies any pain at present.    She tells me that her Klonopin was recently decreased "and I shake a whole lot if I don't take that medicine and my nerves are a wreck."  She generally requires the Klonopin twice daily, but her last prescription was only for 30 pills per her report.  Upon reviewing her medications, it appears that Dr. Moshe Cipro, her PCP has been managing this medication. I recommended she call Dr. Moshe Cipro to let her know about her symptoms and to discuss her concerns with her.    She continues to have a "knot" to her right shoulder; denies any pain, skin changes, or drainage from this area; it has been there for quite some time.   Overall, she tells me that she feels ready for her next cycle of therapy today.     REVIEW OF SYSTEMS:  Review of Systems  Constitutional: Positive for fatigue. Negative for chills and fever.  HENT:  Negative.   Eyes:  Negative.   Respiratory: Negative.  Negative for cough and shortness of breath.   Cardiovascular: Positive for leg swelling (feet swelling at times).  Gastrointestinal: Positive for constipation and diarrhea. Negative for nausea and vomiting.  Endocrine: Negative.   Genitourinary: Negative.  Negative for dysuria, hematuria and vaginal bleeding.   Musculoskeletal: Positive for arthralgias and back pain.       "knot on my right shoulder"  Skin: Negative.   Neurological: Negative.   Hematological: Negative.   Psychiatric/Behavioral: The patient is nervous/anxious.      PAST MEDICAL/SURGICAL HISTORY:  Past Medical History:  Diagnosis Date  . Anemia due to antineoplastic chemotherapy 09/12/2015   Started Aranesp 500 mcg on 09/12/2015  . Anxiety   . Chronic diarrhea   . Depression   .  DNR (do not resuscitate) 08/16/2015  . Erosive esophagitis   . GERD (gastroesophageal reflux disease)   . Hx of adenomatous colonic polyps    tubular adenomas, last found in 2008  . Hyperplastic colon polyp 03/19/10   tcs by Dr. Gala Romney  . Hypertension 20 years   . Kidney stone    hx/ crushed   . Opioid contract exists 04/18/2015   With Dr. Moshe Cipro  . Pancreatic cancer ([facility]) 02/2014  . Pancreatic cancer metastasized to liver ([place]) 03/10/2014  . Schatzki's ring 08/26/10   Last dilated on EGD by Dr. Trevor Iha [facility], linear gastric erosions, BI hemigastrectomy   Past Surgical History:  Procedure Laterality Date  . BREAST LUMPECTOMY Left   . bunion removal     from both feet   . CHOLECYSTECTOMY  1965   . COLONOSCOPY  03/19/2010   DR Gala Romney,, normal TI, pancolonic diverticula, random colon bx neg., hyperplastic polyps removed  . ESOPHAGOGASTRODUODENOSCOPY  11/22/2003   DR Gala Romney, erosive RE, Billroth I  . ESOPHAGOGASTRODUODENOSCOPY  08/26/10   Dr. Gala Romney- moderate severe ERE, Scahtzki ring s/p dilation, Billroth I, linear gastric erosions, bx-gastric xanthelasma  . LEFT SHOULDER SURGERY  2009   DR HARRISON  .  stomach ulcer  50 years ago    had some of her stomach removed      SOCIAL HISTORY:  Social History   Socioeconomic History  . Marital status: Widowed    Spouse name: Not on file  . Number of children: 2  . Years of education: Not on file  . Highest education level: Not on file  Social Needs  . Financial resource strain: Not on file  . Food insecurity - worry: Not on file  . Food insecurity - inability: Not on file  . Transportation needs - medical: Not on file  . Transportation needs - non-medical: Not on file  Occupational History  . Occupation: retired from Estate manager/land agent: RETIRED  Tobacco Use  . Smoking status: Current Some Day Smoker    Packs/day: 0.50    Years: 60.00    Pack years: 30.00    Types: Cigarettes    Last attempt to quit: 09/09/2016    Years since quitting: 0.5  . Smokeless tobacco: Never Used  Substance and Sexual Activity  . Alcohol use: No  . Drug use: No  . Sexual activity: No  Other Topics Concern  . Not on file  Social History Narrative  . Not on file    FAMILY HISTORY:  Family History  Problem Relation Age of Onset  . Hypertension Mother   . Kidney failure Brother        on dialysis  . Diabetes Sister   . Diabetes Sister   . Hypertension Father   . Kidney failure Sister   . Kidney Stones Brother   . Breast cancer Son   . Gout Son   . Liver disease Neg Hx   . Colon cancer Neg Hx     CURRENT MEDICATIONS:  Outpatient Encounter Medications as of 03/25/2017  Medication Sig Note  . acetaminophen (TYLENOL) 500 MG tablet Take 500 mg by mouth every 6 (six) hours as needed for mild pain or moderate pain.    Marland Kitchen amLODipine (NORVASC) 10 MG tablet TAKE 1 TABLET BY MOUTH EVERY DAY   . Calcium-Phosphorus-Vitamin D [phone] MG-MG-UNIT CHEW Chew 2 tablets by mouth daily.   . clonazePAM (KLONOPIN) 0.5 MG tablet One tablet once daily as needed for anxiety   .  cycloSPORINE (RESTASIS) 0.05 % ophthalmic emulsion Place 1 drop into both eyes 2  (two) times daily.   . diphenhydrAMINE (BENADRYL) 50 MG tablet Take 50 mg (1 tab) one hour prior to scan (10 am on 8/31)   . diphenoxylate-atropine (LOMOTIL) 2.5-0.025 MG tablet Take 1 tablet by mouth 4 (four) times daily as needed for diarrhea or loose stools.   . erlotinib (TARCEVA) 100 MG tablet Take 1 tablet (100 mg total) daily by mouth. Take on an empty stomach 1 hour before meals or 2 hours after   . ferrous sulfate 325 (65 FE) MG tablet Take 325 mg by mouth daily with breakfast.   . Gemcitabine HCl (GEMZAR IV) Inject into the vein. Day 1,day 1 every 28 days   . hydrochlorothiazide (MICROZIDE) 12.5 MG capsule    . lidocaine-prilocaine (EMLA) cream APPLY A QUARTER SIZE AMOUNT TO PORT SITE 1 HOURS PRIOR TO CHEMO DO NOT RUB IN COVER WITH PLASTIC WRAP 10/05/2016: Received treatment on 10/02/2016  . meclizine (ANTIVERT) 12.5 MG tablet Take 1 tablet (12.5 mg total) by mouth 3 (three) times daily as needed for dizziness.   . metoprolol tartrate (LOPRESSOR) 25 MG tablet Take 1 tablet (25 mg total) by mouth 2 (two) times daily.   . mirtazapine (REMERON) 7.5 MG tablet Take 1 tablet (7.5 mg total) by mouth at bedtime.   . Multiple Vitamins-Minerals (EQ ONE DAILY WOMENS 50+) TABS Take 1 tablet by mouth daily.   . pantoprazole (PROTONIX) 20 MG tablet TAKE 1 TABLET(20 MG) BY MOUTH DAILY   . potassium chloride (K-DUR) 10 MEQ tablet Take 1 tablet (10 mEq total) by mouth 2 (two) times daily.   . traMADol (ULTRAM) 50 MG tablet One tablet once daily as needed for uncontrolled  pain    Facility-Administered Encounter Medications as of 03/25/2017  Medication  . dexamethasone (DECADRON) 10 mg in sodium chloride 0.9 % 50 mL IVPB    ALLERGIES:  Allergies  Allergen Reactions  . Iohexol Hives and Shortness Of Breath    PATIENT HAD TO BE TAKEN TO ED, C/O WHELPS, HIVES, DIFFICULTY BREATHING. PATIENT GIVEN IV CONTRAST AFTER 13 HOUR PRE MEDS ON 01/09/2017 WITH NO REACTION NOTED  . Aciphex [Rabeprazole Sodium] Other  (See Comments)    unknown  . Amlodipine Besylate-Valsartan     Rash to high-dose (5/320)  . Esomeprazole Magnesium   . Omeprazole Other (See Comments)    Patient states "medication didn't work"  . Ciprofloxacin Rash  . Penicillins Swelling and Rash    Has patient had a PCN reaction causing immediate rash, facial/tongue/throat swelling, SOB or lightheadedness with hypotension: Yes Has patient had a PCN reaction causing severe rash involving mucus membranes or skin necrosis: Yes Has patient had a PCN reaction that required hospitalization: No Has patient had a PCN reaction occurring within the last 10 years: yes If all of the above answers are "NO", then may proceed with Cephalosporin use.      PHYSICAL EXAM:  ECOG Performance status: 1-2 - Symptomatic, but largely independent; may require occasional assistance.        Physical Exam  Constitutional: She is oriented to person, place, and time.  Seen in chemo chair in infusion area   HENT:  Head: Normocephalic.  Mouth/Throat: Oropharynx is clear and moist.  Eyes: Conjunctivae are normal. No scleral icterus.  Neck: Normal range of motion. Neck supple.  Cardiovascular: Normal rate and regular rhythm.  Pulmonary/Chest: Effort normal and breath sounds normal. No respiratory distress.  Abdominal: Soft. Bowel  sounds are normal. There is no tenderness.  Musculoskeletal: Normal range of motion. She exhibits no edema.  (R) shoulder near acromion/humerus region with ~1.5 cm mass (? Fractured bone in past?). No larger than on previous exam.  No pain/tenderness on palpation. No ROM deficit.   Lymphadenopathy:    She has no cervical adenopathy.       Right: No supraclavicular adenopathy present.       Left: No supraclavicular adenopathy present.  Neurological: She is alert and oriented to person, place, and time. No cranial nerve deficit.  Skin: Skin is warm and dry. No rash noted.  Psychiatric: Mood, memory, affect and judgment normal.    Nursing note and vitals reviewed.    LABORATORY DATA:  I have reviewed the labs as listed.  CBC    Component Value Date/Time   WBC 4.6 03/25/2017 1053   RBC 3.22 (L) 03/25/2017 1053   HGB 9.5 (L) 03/25/2017 1053   HCT 28.3 (L) 03/25/2017 1053   PLT 134 (L) 03/25/2017 1053   MCV 87.9 03/25/2017 1053   MCH 29.5 03/25/2017 1053   MCHC 33.6 03/25/2017 1053   RDW 18.8 (H) 03/25/2017 1053   LYMPHSABS 1.6 03/25/2017 1053   MONOABS 0.4 03/25/2017 1053   EOSABS 0.3 03/25/2017 1053   BASOSABS 0.0 03/25/2017 1053   CMP Latest Ref Rng & Units 03/25/2017 03/11/2017 03/04/2017  Glucose 65 - 99 mg/dL 90 92 93  BUN 6 - 20 mg/dL 17 21(H) 20  Creatinine 0.44 - 1.00 mg/dL 1.17(H) 0.94 1.08(H)  Sodium 135 - 145 mmol/L 138 137 142  Potassium 3.5 - 5.1 mmol/L 3.6 4.0 3.8  Chloride 101 - 111 mmol/L 103 101 105  CO2 22 - 32 mmol/L '29 28 28  ' Calcium 8.9 - 10.3 mg/dL 8.7(L) 8.8(L) 8.7(L)  Total Protein 6.5 - 8.1 g/dL 6.7 7.0 6.9  Total Bilirubin 0.3 - 1.2 mg/dL 0.9 1.0 0.9  Alkaline Phos 38 - 126 U/L 127(H) 140(H) 133(H)  AST 15 - 41 U/L '19 19 22  ' ALT 14 - 54 U/L 12(L) 14 16        PENDING LABS:    DIAGNOSTIC IMAGING:  CT chest/abd/pelvis: 01/09/17 *Images and radiologic reports reviewed independently and I agree with below findings.  CLINICAL DATA:  Pancreatic cancer with suspected liver metastases  EXAM: CT CHEST, ABDOMEN, AND PELVIS WITH CONTRAST  TECHNIQUE: Multidetector CT imaging of the chest, abdomen and pelvis was performed following the standard protocol during bolus administration of intravenous contrast.  CONTRAST:  137m ISOVUE-300 IOPAMIDOL (ISOVUE-300) INJECTION 61%  COMPARISON:  MRI abdomen dated 09/11/2016. CT chest dated 04/07/2016. CT pelvis dated 03/22/2015.  FINDINGS: CT CHEST FINDINGS  Cardiovascular: Heart is normal in size.  No pericardial effusion.  No evidence of thoracic aortic aneurysm. Atherosclerotic calcifications of the aortic  arch.  Three vessel coronary atherosclerosis.  Right chest port terminates at the cavoatrial junction.  Mediastinum/Nodes: Calcified low right paratracheal node (series 3/image 38), within normal limits.  No suspicious mediastinal, hilar, or axillary lymphadenopathy.  Visualized thyroid is enlarged/ heterogeneous with bilateral thyroid nodules measuring up to 13 mm on the right (series 3/ image 9).  Lungs/Pleura: 2 mm subpleural nodule in the left lower lobe (series 8/ image 41), likely benign.  No suspicious pulmonary nodules.  Mild centrilobular and paraseptal emphysematous changes, upper lobe predominant.  No focal consolidation.  Mild biapical pleural-parenchymal scarring.  No pleural effusion or pneumothorax.  Musculoskeletal: Mild degenerative changes of the thoracic spine.  CT ABDOMEN PELVIS FINDINGS  Hepatobiliary: Multiple scattered hypoenhancing lesions measuring less than 10 mm in the liver (for example, series 3/images 76, 87, 102, and 103), approximately 8-10 in number. These are suspicious for metastatic disease.  Status post cholecystectomy. Mild intrahepatic and extrahepatic ductal dilatation. Dilated common duct, measuring 12 mm, although smoothly tapering at the ampulla.  Pancreas: 2.1 x 2.9 cm heterogeneously enhancing lesion in the pancreatic tail abutting the splenic hilum (series 9/ image 58), corresponding to known primary pancreatic neoplasm. Although difficult to directly compare to prior MRI, this previously measured 1.7 x 2.6 cm.  Spleen: Heterogeneous perfusion of the spleen. 2.5 cm fluid density lesion in the central spleen (series 3/image 98).  Adrenals/Urinary Tract: Adrenal glands are within normal limits.  Numerous bilateral renal cysts of varying sizes and complexities, including a 2.5 cm hyperdense/hemorrhagic cyst along the posterior right upper kidney (series 3/image 90), benign (Bosniak II). Most of the  cysts are simple (Bosniak I). No enhancing renal lesions. No hydronephrosis.  Bladder is mildly thick-walled although underdistended.  Stomach/Bowel: Stomach is within normal limits.  No evidence of bowel obstruction.  Appendix is not discretely visualized.  Mild left colonic stool burden.  Vascular/Lymphatic: No evidence of abdominal aortic aneurysm.  Atherosclerotic calcifications of the abdominal aorta and branch vessels.  No suspicious abdominopelvic lymphadenopathy.  Reproductive: Uterus is unremarkable.  Bilateral ovaries are within normal limits.  Other: No abdominopelvic ascites.  Musculoskeletal: Degenerative changes of the lumbar spine, most prominent at L2-3.  IMPRESSION: 2.1 x 2.9 cm lesion in the pancreatic tail and abutting the splenic hilum, corresponding to known primary pancreatic neoplasm, mildly increased.  Scattered small hypoenhancing lesions in the liver measuring up to 10 mm, approximately 8-10 in number, suspicious for hepatic metastases.  No findings specific for metastatic disease in the chest.  Additional ancillary findings as above.   Electronically Signed   By: Julian Hy M.D.   On: 01/09/2017 15:09       PATHOLOGY:  Liver biopsy: 03/09/14     Foundation One results: 01/29/17             ASSESSMENT & PLAN:   Stage IV pancreatic cancer with liver mets:  -Metastatic disease noted 03/10/14 with biopsy confirmed liver mets. Received chemotherapy with Gemzar/Abraxane on days 1 & 8 every 28 day cycle; CA 19-9 normalized and subsequent imaging revealed response and later no residual detectable liver mets in 07/24/15. Chemotherapy drug holiday started in 02/2016, after 2 years of chemotherapy. MRI on 09/11/16 indicates progression of pancreatic tail lesion & right liver with concern for recurrent metastatic disease. CA 19-9 was rising as well. -Restaging CT chest/abd/pelvis on 01/09/17 revealed mildly  increased lesion in pancreatic tail, as well as scattered small liver lesions, approximately 8-10 in number, suspicious for worsening liver mets. No findings for metastatic disease in the chest.   -Treatment changed to Gemzar on days 1 & 8, (day 15 to be omitted given previous cytopenias with previous chemotherapy) + Tarceva 100 mg daily; she started new regimen on 01/28/17. -Foundation One ordered recently; results revealed KRAS and TP53 mutations, however there are no therapies approved for these mutations and her disease.  MSI status could not be determined; TMB also could not be determined.  -Due for restaging CT chest/abd/pelvis in 04/2017; orders placed today.  -Continue Gemzar/Tarceva as scheduled and prescribed.  Labs reviewed & Gemzar to be given today as scheduled.  -Return to cancer center in 04/2017 a few days after restaging CT scans to review results and discuss subsequent  treatment planning.   ? (R) shoulder mass:  -There is small mass to her right acromion/humerus region, measuring about 1.5 cm. Feels more like bone on physical exam; unknown if she has had previous clavicle fracture?  Does not appear to be larger than on previous exam.  She will be due for restaging imaging in 04/2017; can consider imaging her shoulder as well if needed, otherwise will keep monitoring.        Dispo:  -Restaging CT chest/abd/pelvis due in 04/2017; orders placed today.  -Return to cancer center in 04/2017 a few days after restaging CT scans to review results and discuss subsequent treatment planning.    All questions were answered to patient's stated satisfaction. Encouraged patient to call with any new concerns or questions before her next visit to the cancer center and we can certain see her sooner, if needed.    Plan of care discussed with Dr. Talbert Cage, who agrees with the above aforementioned.      Orders placed this encounter:  Orders Placed This Encounter  Procedures  . CT Chest W Contrast   . CT Abdomen Pelvis W Contrast      Mike Craze, NP [place] [phone]

## 2017-03-25 ENCOUNTER — Encounter (HOSPITAL_COMMUNITY): Payer: Self-pay | Admitting: Adult Health

## 2017-03-25 ENCOUNTER — Encounter (HOSPITAL_BASED_OUTPATIENT_CLINIC_OR_DEPARTMENT_OTHER): Payer: Medicare HMO

## 2017-03-25 ENCOUNTER — Other Ambulatory Visit: Payer: Self-pay

## 2017-03-25 ENCOUNTER — Encounter (HOSPITAL_COMMUNITY): Payer: Medicare HMO | Attending: Oncology | Admitting: Adult Health

## 2017-03-25 VITALS — BP 164/51 | HR 55 | Temp 97.9°F | Resp 18 | Wt 126.6 lb

## 2017-03-25 DIAGNOSIS — Z808 Family history of malignant neoplasm of other organs or systems: Secondary | ICD-10-CM | POA: Diagnosis not present

## 2017-03-25 DIAGNOSIS — I10 Essential (primary) hypertension: Secondary | ICD-10-CM | POA: Diagnosis not present

## 2017-03-25 DIAGNOSIS — Z8249 Family history of ischemic heart disease and other diseases of the circulatory system: Secondary | ICD-10-CM | POA: Insufficient documentation

## 2017-03-25 DIAGNOSIS — Z87442 Personal history of urinary calculi: Secondary | ICD-10-CM | POA: Insufficient documentation

## 2017-03-25 DIAGNOSIS — Z841 Family history of disorders of kidney and ureter: Secondary | ICD-10-CM | POA: Diagnosis not present

## 2017-03-25 DIAGNOSIS — R69 Illness, unspecified: Secondary | ICD-10-CM | POA: Diagnosis not present

## 2017-03-25 DIAGNOSIS — C252 Malignant neoplasm of tail of pancreas: Secondary | ICD-10-CM

## 2017-03-25 DIAGNOSIS — Z5111 Encounter for antineoplastic chemotherapy: Secondary | ICD-10-CM

## 2017-03-25 DIAGNOSIS — C787 Secondary malignant neoplasm of liver and intrahepatic bile duct: Secondary | ICD-10-CM

## 2017-03-25 DIAGNOSIS — G8929 Other chronic pain: Secondary | ICD-10-CM | POA: Diagnosis not present

## 2017-03-25 DIAGNOSIS — Z833 Family history of diabetes mellitus: Secondary | ICD-10-CM | POA: Diagnosis not present

## 2017-03-25 DIAGNOSIS — Z87891 Personal history of nicotine dependence: Secondary | ICD-10-CM | POA: Diagnosis not present

## 2017-03-25 DIAGNOSIS — Z801 Family history of malignant neoplasm of trachea, bronchus and lung: Secondary | ICD-10-CM | POA: Diagnosis not present

## 2017-03-25 DIAGNOSIS — Z9049 Acquired absence of other specified parts of digestive tract: Secondary | ICD-10-CM | POA: Diagnosis not present

## 2017-03-25 DIAGNOSIS — C259 Malignant neoplasm of pancreas, unspecified: Secondary | ICD-10-CM

## 2017-03-25 DIAGNOSIS — Z66 Do not resuscitate: Secondary | ICD-10-CM | POA: Diagnosis not present

## 2017-03-25 DIAGNOSIS — M549 Dorsalgia, unspecified: Secondary | ICD-10-CM | POA: Insufficient documentation

## 2017-03-25 DIAGNOSIS — E876 Hypokalemia: Secondary | ICD-10-CM | POA: Diagnosis not present

## 2017-03-25 DIAGNOSIS — R350 Frequency of micturition: Secondary | ICD-10-CM | POA: Diagnosis not present

## 2017-03-25 DIAGNOSIS — K219 Gastro-esophageal reflux disease without esophagitis: Secondary | ICD-10-CM | POA: Insufficient documentation

## 2017-03-25 DIAGNOSIS — L400 Psoriasis vulgaris: Secondary | ICD-10-CM | POA: Diagnosis not present

## 2017-03-25 DIAGNOSIS — Z9221 Personal history of antineoplastic chemotherapy: Secondary | ICD-10-CM | POA: Insufficient documentation

## 2017-03-25 DIAGNOSIS — F329 Major depressive disorder, single episode, unspecified: Secondary | ICD-10-CM | POA: Diagnosis not present

## 2017-03-25 DIAGNOSIS — F419 Anxiety disorder, unspecified: Secondary | ICD-10-CM | POA: Insufficient documentation

## 2017-03-25 DIAGNOSIS — R2230 Localized swelling, mass and lump, unspecified upper limb: Secondary | ICD-10-CM | POA: Diagnosis not present

## 2017-03-25 DIAGNOSIS — Z8719 Personal history of other diseases of the digestive system: Secondary | ICD-10-CM | POA: Diagnosis not present

## 2017-03-25 DIAGNOSIS — Z9889 Other specified postprocedural states: Secondary | ICD-10-CM | POA: Diagnosis not present

## 2017-03-25 LAB — COMPREHENSIVE METABOLIC PANEL
ALK PHOS: 127 U/L — AB (ref 38–126)
ALT: 12 U/L — ABNORMAL LOW (ref 14–54)
ANION GAP: 6 (ref 5–15)
AST: 19 U/L (ref 15–41)
Albumin: 3.3 g/dL — ABNORMAL LOW (ref 3.5–5.0)
BILIRUBIN TOTAL: 0.9 mg/dL (ref 0.3–1.2)
BUN: 17 mg/dL (ref 6–20)
CALCIUM: 8.7 mg/dL — AB (ref 8.9–10.3)
CO2: 29 mmol/L (ref 22–32)
Chloride: 103 mmol/L (ref 101–111)
Creatinine, Ser: 1.17 mg/dL — ABNORMAL HIGH (ref 0.44–1.00)
GFR, EST AFRICAN AMERICAN: 47 mL/min — AB (ref >60)
GFR, EST NON AFRICAN AMERICAN: 41 mL/min — AB (ref >60)
GLUCOSE: 90 mg/dL (ref 65–99)
Potassium: 3.6 mmol/L (ref 3.5–5.1)
Sodium: 138 mmol/L (ref 135–145)
TOTAL PROTEIN: 6.7 g/dL (ref 6.5–8.1)

## 2017-03-25 LAB — CBC WITH DIFFERENTIAL/PLATELET
Basophils Absolute: 0 10*3/uL (ref 0.0–0.1)
Basophils Relative: 0 %
Eosinophils Absolute: 0.3 10*3/uL (ref 0.0–0.7)
Eosinophils Relative: 5 %
HEMATOCRIT: 28.3 % — AB (ref 36.0–46.0)
HEMOGLOBIN: 9.5 g/dL — AB (ref 12.0–15.0)
LYMPHS ABS: 1.6 10*3/uL (ref 0.7–4.0)
Lymphocytes Relative: 35 %
MCH: 29.5 pg (ref 26.0–34.0)
MCHC: 33.6 g/dL (ref 30.0–36.0)
MCV: 87.9 fL (ref 78.0–100.0)
MONO ABS: 0.4 10*3/uL (ref 0.1–1.0)
MONOS PCT: 8 %
NEUTROS PCT: 52 %
Neutro Abs: 2.4 10*3/uL (ref 1.7–7.7)
Platelets: 134 10*3/uL — ABNORMAL LOW (ref 150–400)
RBC: 3.22 MIL/uL — ABNORMAL LOW (ref 3.87–5.11)
RDW: 18.8 % — AB (ref 11.5–15.5)
WBC: 4.6 10*3/uL (ref 4.0–10.5)

## 2017-03-25 MED ORDER — SODIUM CHLORIDE 0.9% FLUSH
10.0000 mL | INTRAVENOUS | Status: DC | PRN
  Administered 2017-03-25: 10 mL
  Filled 2017-03-25: qty 10

## 2017-03-25 MED ORDER — SODIUM CHLORIDE 0.9 % IV SOLN
Freq: Once | INTRAVENOUS | Status: AC
  Administered 2017-03-25: 12:00:00 via INTRAVENOUS

## 2017-03-25 MED ORDER — HEPARIN SOD (PORK) LOCK FLUSH 100 UNIT/ML IV SOLN
500.0000 [IU] | Freq: Once | INTRAVENOUS | Status: AC | PRN
  Administered 2017-03-25: 500 [IU]
  Filled 2017-03-25 (×2): qty 5

## 2017-03-25 MED ORDER — PROCHLORPERAZINE MALEATE 10 MG PO TABS
10.0000 mg | ORAL_TABLET | Freq: Once | ORAL | Status: AC
Start: 2017-03-25 — End: 2017-03-25
  Administered 2017-03-25: 10 mg via ORAL
  Filled 2017-03-25: qty 1

## 2017-03-25 MED ORDER — SODIUM CHLORIDE 0.9 % IV SOLN
800.0000 mg/m2 | Freq: Once | INTRAVENOUS | Status: AC
  Administered 2017-03-25: 1292 mg via INTRAVENOUS
  Filled 2017-03-25: qty 23.46

## 2017-03-25 NOTE — Patient Instructions (Signed)
[facility] Discharge Instructions for Patients Receiving Chemotherapy   Beginning January 23rd 2017 lab work for the Cancer Center will be done in the  Main lab at [facility] on 1st floor. If you have a lab appointment with the Cancer Center please come in thru the  Main Entrance and check in at the main information desk   Today you received the following chemotherapy agents   To help prevent nausea and vomiting after your treatment, we encourage you to take your nausea medication     If you develop nausea and vomiting, or diarrhea that is not controlled by your medication, call the clinic.  The clinic phone number is [phone]. Office hours are Monday-Friday 8:30am-5:00pm.  BELOW ARE SYMPTOMS THAT SHOULD BE REPORTED IMMEDIATELY:  *FEVER GREATER THAN 101.0 F  *CHILLS WITH OR WITHOUT FEVER  NAUSEA AND VOMITING THAT IS NOT CONTROLLED WITH YOUR NAUSEA MEDICATION  *UNUSUAL SHORTNESS OF BREATH  *UNUSUAL BRUISING OR BLEEDING  TENDERNESS IN MOUTH AND THROAT WITH OR WITHOUT PRESENCE OF ULCERS  *URINARY PROBLEMS  *BOWEL PROBLEMS  UNUSUAL RASH Items with * indicate a potential emergency and should be followed up as soon as possible. If you have an emergency after office hours please contact your primary care physician or go to the nearest emergency department.  Please call the clinic during office hours if you have any questions or concerns.   You may also contact the Patient Navigator at [phone] should you have any questions or need assistance in obtaining follow up care.      Resources For Cancer Patients and their Caregivers ? American Cancer Society: Can assist with transportation, wigs, general needs, runs Look Good Feel Better.        1-888-227-6333 ? Cancer Care: Provides financial assistance, online support groups, medication/co-pay assistance.  1-800-813-HOPE (4673) ? [facility] Assists [place] cancer  patients and their families through emotional , educational and financial support.  336-427-4357 ? [place] DSS Where to apply for food stamps, Medicaid and utility assistance. 336-342-1394 ? [facility]: Transportation to medical appointments. 336-347-2287 ? Social Security Administration: May apply for disability if have a Stage IV cancer. 336-342-7796 1-800-772-1213 ? [place] Aging, Disability and Transit Services: Assists with nutrition, care and transit needs. 336-349-2343         

## 2017-03-25 NOTE — Progress Notes (Signed)
Labs reviewed with MD, proceed with treatment.  Treatment given per orders. Patient tolerated it well without problems. Vitals stable and discharged home from clinic ambulatory. Follow up as scheduled.  

## 2017-03-25 NOTE — Patient Instructions (Signed)
[place] at [facility] Discharge Instructions  RECOMMENDATIONS MADE BY THE CONSULTANT AND ANY TEST RESULTS WILL BE SENT TO YOUR REFERRING PHYSICIAN.  You were seen today by Mike Craze, NP Continue getting treatment every day 1 & 8 and continue Tarceva at home We will schedule your CT scans in December We will see you a few days after scans to review results See schedulers up front for appointments.   Thank you for choosing [place] at [facility] to provide your oncology and hematology care.  To afford each patient quality time with our provider, please arrive at least 15 minutes before your scheduled appointment time.    If you have a lab appointment with the [facility] please come in thru the  Main Entrance and check in at the main information desk  You need to re-schedule your appointment should you arrive 10 or more minutes late.  We strive to give you quality time with our providers, and arriving late affects you and other patients whose appointments are after yours.  Also, if you no show three or more times for appointments you may be dismissed from the clinic at the providers discretion.     Again, thank you for choosing [facility].  Our hope is that these requests will decrease the amount of time that you wait before being seen by our physicians.       _____________________________________________________________  Should you have questions after your visit to [facility], please contact our office at [phone] between the hours of 8:30 a.m. and 4:30 p.m.  Voicemails left after 4:30 p.m. will not be returned until the following business day.  For prescription refill requests, have your pharmacy contact our office.       Resources For Cancer Patients and their Caregivers ? American Cancer Society: Can assist with transportation, wigs, general needs, runs Look Good Feel Better.         863-780-8114 ? Cancer Care: Provides financial assistance, online support groups, medication/co-pay assistance.  1-800-813-HOPE (684)868-9322) ? [place] Assists [place] cancer patients and their families through emotional , educational and financial support.  920-533-0842 ? [place] DSS Where to apply for food stamps, Medicaid and utility assistance. 979 534 3757 ? RCATS: Transportation to medical appointments. (437) 221-3572 ? Social Security Administration: May apply for disability if have a Stage IV cancer. 859-055-1818 365-777-0897 ? [employer], Disability and Transit Services: Assists with nutrition, care and transit needs. [place] Support Programs: @10RELATIVEDAYS @ > Cancer Support Group  2nd Tuesday of the month 1pm-2pm, Journey Room  > Creative Journey  3rd Tuesday of the month 1130am-1pm, Journey Room  > Look Good Feel Better  1st Wednesday of the month 10am-12 noon, Journey Room (Call [place] to register [phone])

## 2017-03-25 NOTE — Progress Notes (Signed)
Patient is taking Tarceva and has not missed any doses and reports no side effects at this time.

## 2017-03-30 ENCOUNTER — Other Ambulatory Visit: Payer: Self-pay | Admitting: Family Medicine

## 2017-03-30 MED ORDER — CLONAZEPAM 0.5 MG PO TABS
ORAL_TABLET | ORAL | 3 refills | Status: DC
Start: 2017-03-30 — End: 2017-06-16

## 2017-03-30 NOTE — Telephone Encounter (Signed)
Patient says she needs at least 2 klonopin a day because she has the shakes. Requesting a refill. Cb#: [phone] Pharmacy: [facility]

## 2017-04-01 ENCOUNTER — Encounter (HOSPITAL_BASED_OUTPATIENT_CLINIC_OR_DEPARTMENT_OTHER): Payer: Medicare HMO

## 2017-04-01 ENCOUNTER — Encounter (HOSPITAL_COMMUNITY): Payer: Self-pay

## 2017-04-01 DIAGNOSIS — Z9221 Personal history of antineoplastic chemotherapy: Secondary | ICD-10-CM | POA: Diagnosis not present

## 2017-04-01 DIAGNOSIS — M549 Dorsalgia, unspecified: Secondary | ICD-10-CM | POA: Diagnosis not present

## 2017-04-01 DIAGNOSIS — Z452 Encounter for adjustment and management of vascular access device: Secondary | ICD-10-CM | POA: Diagnosis not present

## 2017-04-01 DIAGNOSIS — R69 Illness, unspecified: Secondary | ICD-10-CM | POA: Diagnosis not present

## 2017-04-01 DIAGNOSIS — R350 Frequency of micturition: Secondary | ICD-10-CM | POA: Diagnosis not present

## 2017-04-01 DIAGNOSIS — E876 Hypokalemia: Secondary | ICD-10-CM | POA: Diagnosis not present

## 2017-04-01 DIAGNOSIS — C787 Secondary malignant neoplasm of liver and intrahepatic bile duct: Secondary | ICD-10-CM

## 2017-04-01 DIAGNOSIS — Z66 Do not resuscitate: Secondary | ICD-10-CM | POA: Diagnosis not present

## 2017-04-01 DIAGNOSIS — C259 Malignant neoplasm of pancreas, unspecified: Secondary | ICD-10-CM

## 2017-04-01 DIAGNOSIS — G8929 Other chronic pain: Secondary | ICD-10-CM | POA: Diagnosis not present

## 2017-04-01 LAB — CBC WITH DIFFERENTIAL/PLATELET
BASOS ABS: 0 10*3/uL (ref 0.0–0.1)
Basophils Relative: 1 %
EOS ABS: 0.1 10*3/uL (ref 0.0–0.7)
Eosinophils Relative: 2 %
HCT: 28.4 % — ABNORMAL LOW (ref 36.0–46.0)
HEMOGLOBIN: 9.1 g/dL — AB (ref 12.0–15.0)
LYMPHS PCT: 60 %
Lymphs Abs: 1.4 10*3/uL (ref 0.7–4.0)
MCH: 28.6 pg (ref 26.0–34.0)
MCHC: 32 g/dL (ref 30.0–36.0)
MCV: 89.3 fL (ref 78.0–100.0)
Monocytes Absolute: 0.2 10*3/uL (ref 0.1–1.0)
Monocytes Relative: 7 %
NEUTROS PCT: 30 %
Neutro Abs: 0.8 10*3/uL — ABNORMAL LOW (ref 1.7–7.7)
Platelets: 133 10*3/uL — ABNORMAL LOW (ref 150–400)
RBC: 3.18 MIL/uL — ABNORMAL LOW (ref 3.87–5.11)
RDW: 18.1 % — ABNORMAL HIGH (ref 11.5–15.5)
WBC: 2.5 10*3/uL — ABNORMAL LOW (ref 4.0–10.5)

## 2017-04-01 LAB — COMPREHENSIVE METABOLIC PANEL
ALBUMIN: 3.3 g/dL — AB (ref 3.5–5.0)
ALK PHOS: 134 U/L — AB (ref 38–126)
ALT: 16 U/L (ref 14–54)
ANION GAP: 7 (ref 5–15)
AST: 21 U/L (ref 15–41)
BUN: 16 mg/dL (ref 6–20)
CALCIUM: 8.8 mg/dL — AB (ref 8.9–10.3)
CO2: 29 mmol/L (ref 22–32)
Chloride: 101 mmol/L (ref 101–111)
Creatinine, Ser: 0.9 mg/dL (ref 0.44–1.00)
GFR calc non Af Amer: 56 mL/min — ABNORMAL LOW (ref >60)
Glucose, Bld: 98 mg/dL (ref 65–99)
POTASSIUM: 3.9 mmol/L (ref 3.5–5.1)
SODIUM: 137 mmol/L (ref 135–145)
TOTAL PROTEIN: 7.2 g/dL (ref 6.5–8.1)
Total Bilirubin: 0.9 mg/dL (ref 0.3–1.2)

## 2017-04-01 MED ORDER — HEPARIN SOD (PORK) LOCK FLUSH 100 UNIT/ML IV SOLN
INTRAVENOUS | Status: AC
  Filled 2017-04-01: qty 5

## 2017-04-01 MED ORDER — HEPARIN SOD (PORK) LOCK FLUSH 100 UNIT/ML IV SOLN
500.0000 [IU] | Freq: Once | INTRAVENOUS | Status: AC
  Administered 2017-04-01: 500 [IU] via INTRAVENOUS

## 2017-04-01 MED ORDER — SODIUM CHLORIDE 0.9% FLUSH
10.0000 mL | Freq: Once | INTRAVENOUS | Status: AC
  Administered 2017-04-01: 10 mL via INTRAVENOUS

## 2017-04-01 NOTE — Patient Instructions (Signed)
[place] at [facility]  Discharge Instructions:  Your treatment was held today due to low white cell counts.  Use low WBC precautions.  Call for any fevers or chills. Return next week for chemotherapy with labs.  _______________________________________________________________  Thank you for choosing [place] at [facility] to provide your oncology and hematology care.  To afford each patient quality time with our providers, please arrive at least 15 minutes before your scheduled appointment.  You need to re-schedule your appointment if you arrive 10 or more minutes late.  We strive to give you quality time with our providers, and arriving late affects you and other patients whose appointments are after yours.  Also, if you no show three or more times for appointments you may be dismissed from the clinic.  Again, thank you for choosing [place] at [place] hope is that these requests will allow you access to exceptional care and in a timely manner. _______________________________________________________________  If you have questions after your visit, please contact our office at [phone] between the hours of 8:30 a.m. and 5:00 p.m. Voicemails left after 4:30 p.m. will not be returned until the following business day. _______________________________________________________________  For prescription refill requests, have your pharmacy contact our office. _______________________________________________________________  Recommendations made by the consultant and any test results will be sent to your referring physician. _______________________________________________________________

## 2017-04-01 NOTE — Progress Notes (Signed)
To treatment area for labs and chemotherapy. Patient taking Tarceva as directed.  Stated her diarrhea and constipation was better.  Diarrhea managed with lomotil.  No problems with rash, itching, SOB, or complaints of neuropathy.  Good appetite.  Prn right hip pain that is new during the night but does not hurt now. Used tylenol for relief.  No changes in prn right knee pain.    Reviewed labs with Mike Craze, NP, with Neutro Abs 0.8 today.  Treatment to be held today.  Reviewed low white cell count precautions with the patient and understanding verbalized.  Pharmacy notified.   Port flushed per protocol and de accessed with no complaints voiced.  Band aid applied.  VSS with discharge and left ambulatory with no s/s of distress noted.

## 2017-04-08 ENCOUNTER — Encounter (HOSPITAL_BASED_OUTPATIENT_CLINIC_OR_DEPARTMENT_OTHER): Payer: Medicare HMO

## 2017-04-08 ENCOUNTER — Other Ambulatory Visit: Payer: Self-pay

## 2017-04-08 ENCOUNTER — Encounter (HOSPITAL_COMMUNITY): Payer: Self-pay

## 2017-04-08 VITALS — BP 156/51 | HR 58 | Temp 98.6°F | Resp 18 | Wt 128.8 lb

## 2017-04-08 DIAGNOSIS — Z5111 Encounter for antineoplastic chemotherapy: Secondary | ICD-10-CM

## 2017-04-08 DIAGNOSIS — C259 Malignant neoplasm of pancreas, unspecified: Secondary | ICD-10-CM

## 2017-04-08 DIAGNOSIS — C787 Secondary malignant neoplasm of liver and intrahepatic bile duct: Secondary | ICD-10-CM

## 2017-04-08 DIAGNOSIS — Z66 Do not resuscitate: Secondary | ICD-10-CM | POA: Diagnosis not present

## 2017-04-08 DIAGNOSIS — R69 Illness, unspecified: Secondary | ICD-10-CM | POA: Diagnosis not present

## 2017-04-08 DIAGNOSIS — E876 Hypokalemia: Secondary | ICD-10-CM | POA: Diagnosis not present

## 2017-04-08 DIAGNOSIS — G8929 Other chronic pain: Secondary | ICD-10-CM | POA: Diagnosis not present

## 2017-04-08 DIAGNOSIS — R350 Frequency of micturition: Secondary | ICD-10-CM | POA: Diagnosis not present

## 2017-04-08 DIAGNOSIS — M549 Dorsalgia, unspecified: Secondary | ICD-10-CM | POA: Diagnosis not present

## 2017-04-08 DIAGNOSIS — Z9221 Personal history of antineoplastic chemotherapy: Secondary | ICD-10-CM | POA: Diagnosis not present

## 2017-04-08 DIAGNOSIS — C252 Malignant neoplasm of tail of pancreas: Secondary | ICD-10-CM | POA: Diagnosis not present

## 2017-04-08 LAB — CBC WITH DIFFERENTIAL/PLATELET
BASOS ABS: 0 10*3/uL (ref 0.0–0.1)
Basophils Relative: 0 %
Eosinophils Absolute: 0.2 10*3/uL (ref 0.0–0.7)
Eosinophils Relative: 4 %
HEMATOCRIT: 29.5 % — AB (ref 36.0–46.0)
Hemoglobin: 9.5 g/dL — ABNORMAL LOW (ref 12.0–15.0)
LYMPHS ABS: 1.5 10*3/uL (ref 0.7–4.0)
LYMPHS PCT: 26 %
MCH: 29.2 pg (ref 26.0–34.0)
MCHC: 32.2 g/dL (ref 30.0–36.0)
MCV: 90.8 fL (ref 78.0–100.0)
Monocytes Absolute: 0.3 10*3/uL (ref 0.1–1.0)
Monocytes Relative: 5 %
NEUTROS ABS: 3.7 10*3/uL (ref 1.7–7.7)
Neutrophils Relative %: 65 %
Platelets: 138 10*3/uL — ABNORMAL LOW (ref 150–400)
RBC: 3.25 MIL/uL — AB (ref 3.87–5.11)
RDW: 19.7 % — ABNORMAL HIGH (ref 11.5–15.5)
WBC: 5.7 10*3/uL (ref 4.0–10.5)

## 2017-04-08 LAB — COMPREHENSIVE METABOLIC PANEL
ALK PHOS: 143 U/L — AB (ref 38–126)
ALT: 13 U/L — AB (ref 14–54)
AST: 24 U/L (ref 15–41)
Albumin: 3.2 g/dL — ABNORMAL LOW (ref 3.5–5.0)
Anion gap: 7 (ref 5–15)
BILIRUBIN TOTAL: 0.8 mg/dL (ref 0.3–1.2)
BUN: 22 mg/dL — AB (ref 6–20)
CALCIUM: 8.5 mg/dL — AB (ref 8.9–10.3)
CO2: 28 mmol/L (ref 22–32)
CREATININE: 1.14 mg/dL — AB (ref 0.44–1.00)
Chloride: 105 mmol/L (ref 101–111)
GFR calc Af Amer: 49 mL/min — ABNORMAL LOW (ref >60)
GFR, EST NON AFRICAN AMERICAN: 42 mL/min — AB (ref >60)
Glucose, Bld: 133 mg/dL — ABNORMAL HIGH (ref 65–99)
Potassium: 3.7 mmol/L (ref 3.5–5.1)
Sodium: 140 mmol/L (ref 135–145)
TOTAL PROTEIN: 6.9 g/dL (ref 6.5–8.1)

## 2017-04-08 MED ORDER — PROCHLORPERAZINE MALEATE 10 MG PO TABS
ORAL_TABLET | ORAL | Status: AC
  Filled 2017-04-08: qty 1

## 2017-04-08 MED ORDER — PROCHLORPERAZINE MALEATE 10 MG PO TABS
10.0000 mg | ORAL_TABLET | Freq: Once | ORAL | Status: AC
  Administered 2017-04-08: 10 mg via ORAL

## 2017-04-08 MED ORDER — HEPARIN SOD (PORK) LOCK FLUSH 100 UNIT/ML IV SOLN
500.0000 [IU] | Freq: Once | INTRAVENOUS | Status: AC | PRN
  Administered 2017-04-08: 500 [IU]

## 2017-04-08 MED ORDER — SODIUM CHLORIDE 0.9 % IV SOLN
Freq: Once | INTRAVENOUS | Status: AC
  Administered 2017-04-08: 12:00:00 via INTRAVENOUS

## 2017-04-08 MED ORDER — SODIUM CHLORIDE 0.9% FLUSH
10.0000 mL | INTRAVENOUS | Status: DC | PRN
  Administered 2017-04-08: 10 mL
  Filled 2017-04-08: qty 10

## 2017-04-08 MED ORDER — SODIUM CHLORIDE 0.9 % IV SOLN
800.0000 mg/m2 | Freq: Once | INTRAVENOUS | Status: AC
  Administered 2017-04-08: 1292 mg via INTRAVENOUS
  Filled 2017-04-08: qty 26.3

## 2017-04-08 NOTE — Progress Notes (Signed)
Labs reviewed with MD, proceed with treatment.  Treatment given per orders. Patient tolerated it well without problems. Vitals stable and discharged home from clinic ambulatory. Follow up as scheduled.  

## 2017-04-08 NOTE — Patient Instructions (Signed)
[facility] Discharge Instructions for Patients Receiving Chemotherapy   Beginning January 23rd 2017 lab work for the Cancer Center will be done in the  Main lab at [employer] on 1st floor. If you have a lab appointment with the Cancer Center please come in thru the  Main Entrance and check in at the main information desk   Today you received the following chemotherapy agents   To help prevent nausea and vomiting after your treatment, we encourage you to take your nausea medication     If you develop nausea and vomiting, or diarrhea that is not controlled by your medication, call the clinic.  The clinic phone number is [phone]. Office hours are Monday-Friday 8:30am-5:00pm.  BELOW ARE SYMPTOMS THAT SHOULD BE REPORTED IMMEDIATELY:  *FEVER GREATER THAN 101.0 F  *CHILLS WITH OR WITHOUT FEVER  NAUSEA AND VOMITING THAT IS NOT CONTROLLED WITH YOUR NAUSEA MEDICATION  *UNUSUAL SHORTNESS OF BREATH  *UNUSUAL BRUISING OR BLEEDING  TENDERNESS IN MOUTH AND THROAT WITH OR WITHOUT PRESENCE OF ULCERS  *URINARY PROBLEMS  *BOWEL PROBLEMS  UNUSUAL RASH Items with * indicate a potential emergency and should be followed up as soon as possible. If you have an emergency after office hours please contact your primary care physician or go to the nearest emergency department.  Please call the clinic during office hours if you have any questions or concerns.   You may also contact the Patient Navigator at [phone] should you have any questions or need assistance in obtaining follow up care.      Resources For Cancer Patients and their Caregivers ? American Cancer Society: Can assist with transportation, wigs, general needs, runs Look Good Feel Better.        1-888-227-6333 ? Cancer Care: Provides financial assistance, online support groups, medication/co-pay assistance.  1-800-813-HOPE (4673) ? [facility] Assists [place] cancer  patients and their families through emotional , educational and financial support.  336-427-4357 ? [place] DSS Where to apply for food stamps, Medicaid and utility assistance. 336-342-1394 ? RCATS: Transportation to medical appointments. 336-347-2287 ? Social Security Administration: May apply for disability if have a Stage IV cancer. 336-342-7796 1-800-772-1213 ? [place] Aging, Disability and Transit Services: Assists with nutrition, care and transit needs. 336-349-2343         

## 2017-04-15 ENCOUNTER — Other Ambulatory Visit: Payer: Self-pay

## 2017-04-15 ENCOUNTER — Encounter (HOSPITAL_COMMUNITY): Payer: Self-pay

## 2017-04-15 ENCOUNTER — Encounter (HOSPITAL_COMMUNITY): Payer: Medicare HMO | Attending: Oncology

## 2017-04-15 DIAGNOSIS — F419 Anxiety disorder, unspecified: Secondary | ICD-10-CM | POA: Insufficient documentation

## 2017-04-15 DIAGNOSIS — Z8249 Family history of ischemic heart disease and other diseases of the circulatory system: Secondary | ICD-10-CM | POA: Diagnosis not present

## 2017-04-15 DIAGNOSIS — Z841 Family history of disorders of kidney and ureter: Secondary | ICD-10-CM | POA: Diagnosis not present

## 2017-04-15 DIAGNOSIS — C787 Secondary malignant neoplasm of liver and intrahepatic bile duct: Secondary | ICD-10-CM | POA: Diagnosis not present

## 2017-04-15 DIAGNOSIS — C252 Malignant neoplasm of tail of pancreas: Secondary | ICD-10-CM | POA: Diagnosis not present

## 2017-04-15 DIAGNOSIS — D701 Agranulocytosis secondary to cancer chemotherapy: Secondary | ICD-10-CM | POA: Diagnosis not present

## 2017-04-15 DIAGNOSIS — Z87442 Personal history of urinary calculi: Secondary | ICD-10-CM | POA: Insufficient documentation

## 2017-04-15 DIAGNOSIS — C259 Malignant neoplasm of pancreas, unspecified: Secondary | ICD-10-CM | POA: Diagnosis not present

## 2017-04-15 DIAGNOSIS — R350 Frequency of micturition: Secondary | ICD-10-CM | POA: Insufficient documentation

## 2017-04-15 DIAGNOSIS — Z9049 Acquired absence of other specified parts of digestive tract: Secondary | ICD-10-CM | POA: Insufficient documentation

## 2017-04-15 DIAGNOSIS — Z87891 Personal history of nicotine dependence: Secondary | ICD-10-CM | POA: Diagnosis not present

## 2017-04-15 DIAGNOSIS — I10 Essential (primary) hypertension: Secondary | ICD-10-CM | POA: Insufficient documentation

## 2017-04-15 DIAGNOSIS — Z9221 Personal history of antineoplastic chemotherapy: Secondary | ICD-10-CM | POA: Insufficient documentation

## 2017-04-15 DIAGNOSIS — Z8719 Personal history of other diseases of the digestive system: Secondary | ICD-10-CM | POA: Diagnosis not present

## 2017-04-15 DIAGNOSIS — Z808 Family history of malignant neoplasm of other organs or systems: Secondary | ICD-10-CM | POA: Insufficient documentation

## 2017-04-15 DIAGNOSIS — F329 Major depressive disorder, single episode, unspecified: Secondary | ICD-10-CM | POA: Insufficient documentation

## 2017-04-15 DIAGNOSIS — M549 Dorsalgia, unspecified: Secondary | ICD-10-CM | POA: Insufficient documentation

## 2017-04-15 DIAGNOSIS — Z9889 Other specified postprocedural states: Secondary | ICD-10-CM | POA: Diagnosis not present

## 2017-04-15 DIAGNOSIS — G8929 Other chronic pain: Secondary | ICD-10-CM | POA: Insufficient documentation

## 2017-04-15 DIAGNOSIS — K219 Gastro-esophageal reflux disease without esophagitis: Secondary | ICD-10-CM | POA: Diagnosis not present

## 2017-04-15 DIAGNOSIS — Z801 Family history of malignant neoplasm of trachea, bronchus and lung: Secondary | ICD-10-CM | POA: Diagnosis not present

## 2017-04-15 DIAGNOSIS — E876 Hypokalemia: Secondary | ICD-10-CM | POA: Insufficient documentation

## 2017-04-15 DIAGNOSIS — Z66 Do not resuscitate: Secondary | ICD-10-CM | POA: Insufficient documentation

## 2017-04-15 DIAGNOSIS — Z833 Family history of diabetes mellitus: Secondary | ICD-10-CM | POA: Insufficient documentation

## 2017-04-15 DIAGNOSIS — R69 Illness, unspecified: Secondary | ICD-10-CM | POA: Diagnosis not present

## 2017-04-15 LAB — CBC WITH DIFFERENTIAL/PLATELET
BASOS ABS: 0 10*3/uL (ref 0.0–0.1)
BASOS PCT: 1 %
EOS ABS: 0 10*3/uL (ref 0.0–0.7)
Eosinophils Relative: 2 %
HEMATOCRIT: 28.8 % — AB (ref 36.0–46.0)
HEMOGLOBIN: 9.3 g/dL — AB (ref 12.0–15.0)
Lymphocytes Relative: 54 %
Lymphs Abs: 1.1 10*3/uL (ref 0.7–4.0)
MCH: 28.9 pg (ref 26.0–34.0)
MCHC: 32.3 g/dL (ref 30.0–36.0)
MCV: 89.4 fL (ref 78.0–100.0)
MONOS PCT: 8 %
Monocytes Absolute: 0.2 10*3/uL (ref 0.1–1.0)
NEUTROS ABS: 0.7 10*3/uL — AB (ref 1.7–7.7)
NEUTROS PCT: 35 %
Platelets: 125 10*3/uL — ABNORMAL LOW (ref 150–400)
RBC: 3.22 MIL/uL — ABNORMAL LOW (ref 3.87–5.11)
RDW: 18.5 % — ABNORMAL HIGH (ref 11.5–15.5)
WBC: 2.1 10*3/uL — AB (ref 4.0–10.5)

## 2017-04-15 LAB — COMPREHENSIVE METABOLIC PANEL
ALK PHOS: 128 U/L — AB (ref 38–126)
ALT: 14 U/L (ref 14–54)
ANION GAP: 7 (ref 5–15)
AST: 21 U/L (ref 15–41)
Albumin: 3.2 g/dL — ABNORMAL LOW (ref 3.5–5.0)
BILIRUBIN TOTAL: 1.1 mg/dL (ref 0.3–1.2)
BUN: 14 mg/dL (ref 6–20)
CALCIUM: 8.8 mg/dL — AB (ref 8.9–10.3)
CO2: 26 mmol/L (ref 22–32)
CREATININE: 0.95 mg/dL (ref 0.44–1.00)
Chloride: 105 mmol/L (ref 101–111)
GFR, EST NON AFRICAN AMERICAN: 53 mL/min — AB (ref >60)
Glucose, Bld: 105 mg/dL — ABNORMAL HIGH (ref 65–99)
Potassium: 3.7 mmol/L (ref 3.5–5.1)
SODIUM: 138 mmol/L (ref 135–145)
TOTAL PROTEIN: 7 g/dL (ref 6.5–8.1)

## 2017-04-15 MED ORDER — HEPARIN SOD (PORK) LOCK FLUSH 100 UNIT/ML IV SOLN
500.0000 [IU] | Freq: Once | INTRAVENOUS | Status: AC
  Administered 2017-04-15: 500 [IU] via INTRAVENOUS

## 2017-04-15 NOTE — Progress Notes (Signed)
Will hold tx today per Dr. Talbert Cage d/t [place] of 700.  Pt will return next week to tentatively receive Day 1, Cycle 4.

## 2017-04-16 LAB — CANCER ANTIGEN 19-9: CA 19-9: 72 U/mL — ABNORMAL HIGH (ref 0–35)

## 2017-04-17 ENCOUNTER — Telehealth (HOSPITAL_COMMUNITY): Payer: Self-pay

## 2017-04-17 ENCOUNTER — Ambulatory Visit (HOSPITAL_COMMUNITY): Admission: RE | Admit: 2017-04-17 | Payer: Medicare HMO | Source: Ambulatory Visit

## 2017-04-17 ENCOUNTER — Other Ambulatory Visit (HOSPITAL_COMMUNITY): Payer: Self-pay | Admitting: *Deleted

## 2017-04-17 ENCOUNTER — Telehealth (HOSPITAL_COMMUNITY): Payer: Self-pay | Admitting: Oncology

## 2017-04-17 DIAGNOSIS — T50995D Adverse effect of other drugs, medicaments and biological substances, subsequent encounter: Secondary | ICD-10-CM

## 2017-04-17 MED ORDER — DIPHENHYDRAMINE HCL 50 MG PO TABS: ORAL_TABLET | ORAL | 0 refills | Status: DC

## 2017-04-17 MED ORDER — PREDNISONE 50 MG PO TABS: ORAL_TABLET | ORAL | 0 refills | Status: DC

## 2017-04-17 MED ORDER — ERLOTINIB HCL 100 MG PO TABS: 100.0000 mg | ORAL_TABLET | Freq: Every day | ORAL | 0 refills | Status: DC

## 2017-04-17 NOTE — Telephone Encounter (Signed)
Patients CT had to be rescheduled because she was supposed to have prep prior to contrast. Called patient and gave her the new appointment and explained the prednisone prep to her with understanding verbalized.

## 2017-04-17 NOTE — Telephone Encounter (Signed)
FAXED [place]

## 2017-04-22 ENCOUNTER — Encounter (HOSPITAL_COMMUNITY): Payer: Self-pay | Admitting: Lab

## 2017-04-22 ENCOUNTER — Encounter (HOSPITAL_COMMUNITY): Payer: Self-pay | Admitting: Oncology

## 2017-04-22 ENCOUNTER — Encounter (HOSPITAL_BASED_OUTPATIENT_CLINIC_OR_DEPARTMENT_OTHER): Payer: Medicare HMO | Admitting: Oncology

## 2017-04-22 ENCOUNTER — Encounter (HOSPITAL_BASED_OUTPATIENT_CLINIC_OR_DEPARTMENT_OTHER): Payer: Medicare HMO

## 2017-04-22 VITALS — BP 180/57 | HR 58 | Temp 97.9°F | Resp 18 | Wt 128.6 lb

## 2017-04-22 DIAGNOSIS — C787 Secondary malignant neoplasm of liver and intrahepatic bile duct: Secondary | ICD-10-CM | POA: Diagnosis not present

## 2017-04-22 DIAGNOSIS — Z66 Do not resuscitate: Secondary | ICD-10-CM | POA: Diagnosis not present

## 2017-04-22 DIAGNOSIS — R350 Frequency of micturition: Secondary | ICD-10-CM | POA: Diagnosis not present

## 2017-04-22 DIAGNOSIS — G8929 Other chronic pain: Secondary | ICD-10-CM | POA: Diagnosis not present

## 2017-04-22 DIAGNOSIS — C252 Malignant neoplasm of tail of pancreas: Secondary | ICD-10-CM

## 2017-04-22 DIAGNOSIS — Z9221 Personal history of antineoplastic chemotherapy: Secondary | ICD-10-CM | POA: Diagnosis not present

## 2017-04-22 DIAGNOSIS — Z5111 Encounter for antineoplastic chemotherapy: Secondary | ICD-10-CM | POA: Diagnosis not present

## 2017-04-22 DIAGNOSIS — C259 Malignant neoplasm of pancreas, unspecified: Secondary | ICD-10-CM

## 2017-04-22 DIAGNOSIS — M549 Dorsalgia, unspecified: Secondary | ICD-10-CM | POA: Diagnosis not present

## 2017-04-22 DIAGNOSIS — E876 Hypokalemia: Secondary | ICD-10-CM | POA: Diagnosis not present

## 2017-04-22 DIAGNOSIS — R69 Illness, unspecified: Secondary | ICD-10-CM | POA: Diagnosis not present

## 2017-04-22 LAB — CBC WITH DIFFERENTIAL/PLATELET
Basophils Absolute: 0 10*3/uL (ref 0.0–0.1)
Basophils Relative: 0 %
EOS ABS: 0.2 10*3/uL (ref 0.0–0.7)
Eosinophils Relative: 4 %
HEMATOCRIT: 29.4 % — AB (ref 36.0–46.0)
Hemoglobin: 9.4 g/dL — ABNORMAL LOW (ref 12.0–15.0)
LYMPHS ABS: 1.4 10*3/uL (ref 0.7–4.0)
Lymphocytes Relative: 28 %
MCH: 28.9 pg (ref 26.0–34.0)
MCHC: 32 g/dL (ref 30.0–36.0)
MCV: 90.5 fL (ref 78.0–100.0)
MONO ABS: 0.6 10*3/uL (ref 0.1–1.0)
MONOS PCT: 13 %
NEUTROS ABS: 2.6 10*3/uL (ref 1.7–7.7)
NEUTROS PCT: 55 %
PLATELETS: 146 10*3/uL — AB (ref 150–400)
RBC: 3.25 MIL/uL — ABNORMAL LOW (ref 3.87–5.11)
RDW: 19.1 % — AB (ref 11.5–15.5)
WBC: 4.8 10*3/uL (ref 4.0–10.5)

## 2017-04-22 LAB — COMPREHENSIVE METABOLIC PANEL
ALBUMIN: 3.2 g/dL — AB (ref 3.5–5.0)
ALK PHOS: 140 U/L — AB (ref 38–126)
ALT: 12 U/L — AB (ref 14–54)
AST: 19 U/L (ref 15–41)
Anion gap: 5 (ref 5–15)
BUN: 15 mg/dL (ref 6–20)
CHLORIDE: 105 mmol/L (ref 101–111)
CO2: 28 mmol/L (ref 22–32)
CREATININE: 1.01 mg/dL — AB (ref 0.44–1.00)
Calcium: 8.5 mg/dL — ABNORMAL LOW (ref 8.9–10.3)
GFR calc non Af Amer: 49 mL/min — ABNORMAL LOW (ref >60)
GFR, EST AFRICAN AMERICAN: 57 mL/min — AB (ref >60)
GLUCOSE: 91 mg/dL (ref 65–99)
Potassium: 3.4 mmol/L — ABNORMAL LOW (ref 3.5–5.1)
SODIUM: 138 mmol/L (ref 135–145)
Total Bilirubin: 0.4 mg/dL (ref 0.3–1.2)
Total Protein: 6.8 g/dL (ref 6.5–8.1)

## 2017-04-22 MED ORDER — SODIUM CHLORIDE 0.9% FLUSH
10.0000 mL | INTRAVENOUS | Status: DC | PRN
  Administered 2017-04-22: 10 mL
  Filled 2017-04-22: qty 10

## 2017-04-22 MED ORDER — PROCHLORPERAZINE MALEATE 10 MG PO TABS
ORAL_TABLET | ORAL | Status: AC
  Filled 2017-04-22: qty 1

## 2017-04-22 MED ORDER — SODIUM CHLORIDE 0.9 % IV SOLN
800.0000 mg/m2 | Freq: Once | INTRAVENOUS | Status: AC
  Administered 2017-04-22: 1292 mg via INTRAVENOUS
  Filled 2017-04-22: qty 10.52

## 2017-04-22 MED ORDER — SODIUM CHLORIDE 0.9 % IV SOLN
Freq: Once | INTRAVENOUS | Status: AC
  Administered 2017-04-22: 12:00:00 via INTRAVENOUS

## 2017-04-22 MED ORDER — PROCHLORPERAZINE MALEATE 10 MG PO TABS
10.0000 mg | ORAL_TABLET | Freq: Once | ORAL | Status: AC
  Administered 2017-04-22: 10 mg via ORAL

## 2017-04-22 MED ORDER — HEPARIN SOD (PORK) LOCK FLUSH 100 UNIT/ML IV SOLN
500.0000 [IU] | Freq: Once | INTRAVENOUS | Status: AC | PRN
  Administered 2017-04-22: 500 [IU]
  Filled 2017-04-22: qty 5

## 2017-04-22 NOTE — Patient Instructions (Signed)
[facility] at [facility]  Discharge Instructions:  You were seen by Dr. Zhou today. _______________________________________________________________  Thank you for choosing [facility] at [facility] to provide your oncology and hematology care.  To afford each patient quality time with our providers, please arrive at least 15 minutes before your scheduled appointment.  You need to re-schedule your appointment if you arrive 10 or more minutes late.  We strive to give you quality time with our providers, and arriving late affects you and other patients whose appointments are after yours.  Also, if you no show three or more times for appointments you may be dismissed from the clinic.  Again, thank you for choosing [facility] at [facility]. Our hope is that these requests will allow you access to exceptional care and in a timely manner. _______________________________________________________________  If you have questions after your visit, please contact our office at [phone] between the hours of 8:30 a.m. and 5:00 p.m. Voicemails left after 4:30 p.m. will not be returned until the following business day. _______________________________________________________________  For prescription refill requests, have your pharmacy contact our office. _______________________________________________________________  Recommendations made by the consultant and any test results will be sent to your referring physician. _______________________________________________________________ 

## 2017-04-22 NOTE — Progress Notes (Unsigned)
Referral to [facility].  Records faxed on 12/12

## 2017-04-22 NOTE — Progress Notes (Signed)
Labs reviewed with Dr. Talbert Cage after oncology follow up visit and ok to treat today.

## 2017-04-22 NOTE — Patient Instructions (Signed)
[facility] Discharge Instructions for Patients Receiving Chemotherapy   Beginning January 23rd 2017 lab work for the Cancer Center will be done in the  Main lab at [facility] on 1st floor. If you have a lab appointment with the Cancer Center please come in thru the  Main Entrance and check in at the main information desk   Today you received the following chemotherapy agents   To help prevent nausea and vomiting after your treatment, we encourage you to take your nausea medication     If you develop nausea and vomiting, or diarrhea that is not controlled by your medication, call the clinic.  The clinic phone number is [phone]. Office hours are Monday-Friday 8:30am-5:00pm.  BELOW ARE SYMPTOMS THAT SHOULD BE REPORTED IMMEDIATELY:  *FEVER GREATER THAN 101.0 F  *CHILLS WITH OR WITHOUT FEVER  NAUSEA AND VOMITING THAT IS NOT CONTROLLED WITH YOUR NAUSEA MEDICATION  *UNUSUAL SHORTNESS OF BREATH  *UNUSUAL BRUISING OR BLEEDING  TENDERNESS IN MOUTH AND THROAT WITH OR WITHOUT PRESENCE OF ULCERS  *URINARY PROBLEMS  *BOWEL PROBLEMS  UNUSUAL RASH Items with * indicate a potential emergency and should be followed up as soon as possible. If you have an emergency after office hours please contact your primary care physician or go to the nearest emergency department.  Please call the clinic during office hours if you have any questions or concerns.   You may also contact the Patient Navigator at [phone] should you have any questions or need assistance in obtaining follow up care.      Resources For Cancer Patients and their Caregivers ? American Cancer Society: Can assist with transportation, wigs, general needs, runs Look Good Feel Better.        1-888-227-6333 ? Cancer Care: Provides financial assistance, online support groups, medication/co-pay assistance.  1-800-813-HOPE (4673) ? [facility] Assists [place] cancer  patients and their families through emotional , educational and financial support.  336-427-4357 ? [place] DSS Where to apply for food stamps, Medicaid and utility assistance. 336-342-1394 ? [employer]: Transportation to medical appointments. 336-347-2287 ? Social Security Administration: May apply for disability if have a Stage IV cancer. 336-342-7796 1-800-772-1213 ? [place] Aging, Disability and Transit Services: Assists with nutrition, care and transit needs. 336-349-2343         

## 2017-04-22 NOTE — Progress Notes (Signed)
Labs reviewed with MD. Proceed with treatment.  Treatment given per orders. Patient tolerated it well without problems. Vitals stable and discharged home from clinic ambulatory. Follow up as scheduled.  

## 2017-04-22 NOTE — Progress Notes (Signed)
[place], [place] [address]   CLINIC:  Medical Oncology/Hematology  PCP:  Fayrene Helper, MD [address] [place] [address] [phone]   REASON FOR VISIT:  Follow-up for Stage IV pancreatic cancer with liver mets   CURRENT THERAPY: Gemzar on days 1 & 8, (day 15 to be omitted given previous cytopenias with previous chemotherapy) + Tarceva 100 mg daily; beginning 01/28/17   BRIEF ONCOLOGIC HISTORY:    Pancreatic cancer metastasized to liver ([place])   03/10/2014 Initial Diagnosis    Pancreatic cancer metastasized to liver      03/22/2014 - 03/05/2016 Chemotherapy    Abraxane/Gemzar days 1, 8, every 28 days.  Day 15 was cancelled due to leukopenia and thrombocytopenia on day 15 cycle 1.      05/24/2014 Treatment Plan Change    Day 8 of cycle 3 is held with ANC of 1.1      06/05/2014 Imaging    CT C/A/P . Interval decrease in size of the pancreatic tail mass.Improved hepatic metastatic disease. No new lesions. No CT findings for metastatic disease involving the chest.      08/09/2014 Tumor Marker    CA 19-9= 33 (WNL)      10/26/2014 Imaging    MRI- Continued interval decrease in size of the hepatic metastatic lesions and no new lesions are identified. Continued decrease in size of the pancreatic tail lesion.      01/03/2015 Tumor Marker    Results for TALISHIA, BETZLER (MRN [id number]) as of 01/18/2015 12:52  01/03/2015 10:00 CA 19-9: 14       01/17/2015 Imaging    MRI- Response to therapy of hepatic metastasis.  Similar size of a pancreatic tail lesion.      03/20/2015 Imaging    MRI-L spine- Severe disc space narrowing at L2-L3, with endplate reactive changes. Large disc extrusion into the ventral epidural space,central to the RIGHT with a cephalad migrated free fragment. Additional Large disc extrusion into the retroperitone...      03/22/2015 Imaging    CT pelvis- No evidence for metastatic disease within the  pelvis.      07/24/2015 Imaging    MRI abd- Continued response to therapy, with no residual detectable liver metastases. No new sites of metastatic disease in the abdomen.       08/15/2015 Code Status     She confirms desire for DNR status.      12/21/2015 Imaging    MRI liver- Severe image degradation due to motion artifact reducing diagnostic sensitivity and specificity. Reduce conspicuity of the pancreatic tail lesion suggesting further improvement. The original liver lesions have resolved.      03/05/2016 Treatment Plan Change    Chemotherapy holiday/Break after 2 years worth of treatment      04/07/2016 Imaging    CT chest- When compared to recent chest CT, new minimally displaced anterior left sixth rib fracture. Slight increase in subcarinal adenopathy      04/28/2016 Imaging    Bone density- BMD as determined from Femur Neck Right is 0.705 g/cm2 with a T-Score of -2.4. This patient is considered osteopenic according to [facility]) criteria. Compared with the prior study on 02/23/2013, the BMD of the lumbar spine/rt. femoral neck show a statistically significant decrease.       05/23/2016 Imaging    MRI abd- 1. Exam is significantly degraded by patient respiratory motion. Consider follow-up exams with CT abdomen with without  contrast per pancreatic protocol. 2. Fullness in tail of pancreas is more prominent and potentially increased in size. Recommend close attention on follow-up. (Consider CT as above) 3. No explanation for back pain.      09/11/2016 Imaging    MRI pancreas- Interval progression of pancreatic tail lesion. New differential perfusion right liver with areas of heterogeneity. Underlying metastatic disease in this region not excluded.      09/11/2016 Progression    MRi in conjunction with rising CA 19-9 are indicative of relapse of disease.      01/09/2017 Progression    CT C/A/P: 2.1 x 2.9 cm lesion in the pancreatic tail and  abutting the splenic hilum, corresponding to known primary pancreatic neoplasm, mildly increased.  Scattered small hypoenhancing lesions in the liver measuring up to 10 mm, approximately 8-10 in number, suspicious for hepatic metastases.  No findings specific for metastatic disease in the chest.  Additional ancillary findings as above.         INTERVAL HISTORY:  Ms. Defino 83 y.o. female returns for routine follow-up for metastatic pancreatic cancer.  Patient states she has been doing well except that 2 weeks ago she had an episode of urinary and fecal incontinence while she was asleep. She states that she has been having a lot of urinary incontinence at night time. Otherwise she states she is doing well. She has some fatigue. She states she is a picky eater but appetite has stayed the same. She denies any rash, chest pain, shortness of breath, abdominal pain. She has been compliant with erlotinib at home and has not noted any side effects. She is tolerating IV gemzar well.   REVIEW OF SYSTEMS:  Review of Systems  Constitutional: Positive for fatigue. Negative for chills and fever.  HENT:  Negative.   Eyes: Negative.   Respiratory: Negative.  Negative for cough and shortness of breath.   Cardiovascular: Negative for leg swelling.  Gastrointestinal: Negative for constipation, diarrhea, nausea and vomiting.  Endocrine: Negative.   Genitourinary: Positive for bladder incontinence and nocturia. Negative for dysuria, hematuria and vaginal bleeding.   Musculoskeletal: Negative for arthralgias and back pain.  Skin: Negative.   Neurological: Negative.   Hematological: Negative.   Psychiatric/Behavioral: The patient is not nervous/anxious.      PAST MEDICAL/SURGICAL HISTORY:  Past Medical History:  Diagnosis Date  . Anemia due to antineoplastic chemotherapy 09/12/2015   Started Aranesp 500 mcg on 09/12/2015  . Anxiety   . Chronic diarrhea   . Depression   . DNR (do not  resuscitate) 08/16/2015  . Erosive esophagitis   . GERD (gastroesophageal reflux disease)   . Hx of adenomatous colonic polyps    tubular adenomas, last found in 2008  . Hyperplastic colon polyp 03/19/10   tcs by Dr. Gala Romney  . Hypertension 20 years   . Kidney stone    hx/ crushed   . Opioid contract exists 04/18/2015   With Dr. Moshe Cipro  . Pancreatic cancer ([place]) 02/2014  . Pancreatic cancer metastasized to liver ([place]) 03/10/2014  . Schatzki's ring 08/26/10   Last dilated on EGD by Dr. Trevor Iha [facility], linear gastric erosions, BI hemigastrectomy   Past Surgical History:  Procedure Laterality Date  . BREAST LUMPECTOMY Left   . bunion removal     from both feet   . CHOLECYSTECTOMY  1965   . COLONOSCOPY  03/19/2010   DR Gala Romney,, normal TI, pancolonic diverticula, random colon bx neg., hyperplastic polyps removed  . ESOPHAGOGASTRODUODENOSCOPY  11/22/2003  DR Gala Romney, erosive RE, Billroth I  . ESOPHAGOGASTRODUODENOSCOPY  08/26/10   Dr. Gala Romney- moderate severe ERE, Scahtzki ring s/p dilation, Billroth I, linear gastric erosions, bx-gastric xanthelasma  . LEFT SHOULDER SURGERY  2009   DR HARRISON  . ORIF ANKLE FRACTURE Right 05/22/2013   Procedure: OPEN REDUCTION INTERNAL FIXATION (ORIF) RIGHT ANKLE FRACTURE;  Surgeon: Sanjuana Kava, MD;  Location: [facility];  Service: Orthopedics;  Laterality: Right;  . stomach ulcer  50 years ago    had some of her stomach removed      SOCIAL HISTORY:  Social History   Socioeconomic History  . Marital status: Widowed    Spouse name: Not on file  . Number of children: 2  . Years of education: Not on file  . Highest education level: Not on file  Social Needs  . Financial resource strain: Not on file  . Food insecurity - worry: Not on file  . Food insecurity - inability: Not on file  . Transportation needs - medical: Not on file  . Transportation needs - non-medical: Not on file  Occupational History  . Occupation: retired from Estate manager/land agent:  RETIRED  Tobacco Use  . Smoking status: Current Some Day Smoker    Packs/day: 0.50    Years: 60.00    Pack years: 30.00    Types: Cigarettes    Last attempt to quit: 09/09/2016    Years since quitting: 0.6  . Smokeless tobacco: Never Used  Substance and Sexual Activity  . Alcohol use: No  . Drug use: No  . Sexual activity: No  Other Topics Concern  . Not on file  Social History Narrative  . Not on file    FAMILY HISTORY:  Family History  Problem Relation Age of Onset  . Hypertension Mother   . Kidney failure Brother        on dialysis  . Diabetes Sister   . Diabetes Sister   . Hypertension Father   . Kidney failure Sister   . Kidney Stones Brother   . Breast cancer Son   . Gout Son   . Liver disease Neg Hx   . Colon cancer Neg Hx     CURRENT MEDICATIONS:  Outpatient Encounter Medications as of 04/22/2017  Medication Sig Note  . acetaminophen (TYLENOL) 500 MG tablet Take 500 mg by mouth every 6 (six) hours as needed for mild pain or moderate pain.    Marland Kitchen amLODipine (NORVASC) 10 MG tablet TAKE 1 TABLET BY MOUTH EVERY DAY   . Calcium-Phosphorus-Vitamin D [phone] MG-MG-UNIT CHEW Chew 2 tablets by mouth daily.   . clonazePAM (KLONOPIN) 0.5 MG tablet TAKE 1 TABLET BY MOUTH DAILY AS NEEDED FOR ANXIETY   . clonazePAM (KLONOPIN) 0.5 MG tablet One tablet once daily ,as needed, for anxiety   . cycloSPORINE (RESTASIS) 0.05 % ophthalmic emulsion Place 1 drop into both eyes 2 (two) times daily.   . diphenhydrAMINE (BENADRYL) 50 MG tablet Take 50 mg (1 tab) one hour prior to scan (10 am on 8/31)   . diphenhydrAMINE (BENADRYL) 50 MG tablet Take 1 tab 1 hour prior to scan at 11 am on 12/13   . diphenoxylate-atropine (LOMOTIL) 2.5-0.025 MG tablet Take 1 tablet by mouth 4 (four) times daily as needed for diarrhea or loose stools.   . erlotinib (TARCEVA) 100 MG tablet Take 1 tablet (100 mg total) by mouth daily. Take on an empty stomach 1 hour before meals or 2 hours after   .  ferrous  sulfate 325 (65 FE) MG tablet Take 325 mg by mouth daily with breakfast.   . Gemcitabine HCl (GEMZAR IV) Inject into the vein. Day 1,day 1 every 28 days   . hydrochlorothiazide (MICROZIDE) 12.5 MG capsule    . lidocaine-prilocaine (EMLA) cream APPLY A QUARTER SIZE AMOUNT TO PORT SITE 1 HOURS PRIOR TO CHEMO DO NOT RUB IN COVER WITH PLASTIC WRAP 10/05/2016: Received treatment on 10/02/2016  . meclizine (ANTIVERT) 12.5 MG tablet Take 1 tablet (12.5 mg total) by mouth 3 (three) times daily as needed for dizziness.   . metoprolol tartrate (LOPRESSOR) 25 MG tablet Take 1 tablet (25 mg total) by mouth 2 (two) times daily.   . mirtazapine (REMERON) 7.5 MG tablet Take 1 tablet (7.5 mg total) by mouth at bedtime.   . Multiple Vitamins-Minerals (EQ ONE DAILY WOMENS 50+) TABS Take 1 tablet by mouth daily.   . pantoprazole (PROTONIX) 20 MG tablet TAKE 1 TABLET(20 MG) BY MOUTH DAILY   . potassium chloride (K-DUR) 10 MEQ tablet Take 1 tablet (10 mEq total) by mouth 2 (two) times daily.   . predniSONE (DELTASONE) 50 MG tablet Take 1 tab 13 hrs prior to scan(11 pm on 12/12), take 1 tab 7 hrs prior to scan(5 am on 12/13), 1 tab 1 hr prior to scan(11 am on 12/13)   . traMADol (ULTRAM) 50 MG tablet One tablet once daily as needed for uncontrolled  pain   . UNABLE TO FIND Take by mouth 3 (three) times daily. Med Name: Curamin    Facility-Administered Encounter Medications as of 04/22/2017  Medication  . dexamethasone (DECADRON) 10 mg in sodium chloride 0.9 % 50 mL IVPB    ALLERGIES:  Allergies  Allergen Reactions  . Iohexol Hives and Shortness Of Breath    PATIENT HAD TO BE TAKEN TO ED, C/O WHELPS, HIVES, DIFFICULTY BREATHING. PATIENT GIVEN IV CONTRAST AFTER 13 HOUR PRE MEDS ON 01/09/2017 WITH NO REACTION NOTED  . Aciphex [Rabeprazole Sodium] Other (See Comments)    unknown  . Amlodipine Besylate-Valsartan     Rash to high-dose (5/320)  . Esomeprazole Magnesium   . Omeprazole Other (See Comments)    Patient  states "medication didn't work"  . Ciprofloxacin Rash  . Penicillins Swelling and Rash    Has patient had a PCN reaction causing immediate rash, facial/tongue/throat swelling, SOB or lightheadedness with hypotension: Yes Has patient had a PCN reaction causing severe rash involving mucus membranes or skin necrosis: Yes Has patient had a PCN reaction that required hospitalization: No Has patient had a PCN reaction occurring within the last 10 years: yes If all of the above answers are "NO", then may proceed with Cephalosporin use.      PHYSICAL EXAM:  ECOG Performance status: 1-2 - Symptomatic, but largely independent; may require occasional assistance.        Physical Exam  Constitutional: She is oriented to person, place, and time.  HENT:  Head: Normocephalic.  Mouth/Throat: Oropharynx is clear and moist.  Eyes: Conjunctivae are normal. No scleral icterus.  Neck: Normal range of motion. Neck supple.  Cardiovascular: Normal rate and regular rhythm.  Pulmonary/Chest: Effort normal and breath sounds normal. No respiratory distress.  Abdominal: Soft. Bowel sounds are normal. There is no tenderness.  Musculoskeletal: Normal range of motion. She exhibits no edema.  Lymphadenopathy:    She has no cervical adenopathy.       Right: No supraclavicular adenopathy present.       Left: No supraclavicular adenopathy  present.  Neurological: She is alert and oriented to person, place, and time. No cranial nerve deficit.  Skin: Skin is warm and dry. No rash noted.  Psychiatric: Mood, memory, affect and judgment normal.  Nursing note and vitals reviewed.    LABORATORY DATA:  I have reviewed the labs as listed.  CBC    Component Value Date/Time   WBC 4.8 04/22/2017 1031   RBC 3.25 (L) 04/22/2017 1031   HGB 9.4 (L) 04/22/2017 1031   HCT 29.4 (L) 04/22/2017 1031   PLT 146 (L) 04/22/2017 1031   MCV 90.5 04/22/2017 1031   MCH 28.9 04/22/2017 1031   MCHC 32.0 04/22/2017 1031   RDW 19.1  (H) 04/22/2017 1031   LYMPHSABS 1.4 04/22/2017 1031   MONOABS 0.6 04/22/2017 1031   EOSABS 0.2 04/22/2017 1031   BASOSABS 0.0 04/22/2017 1031   CMP Latest Ref Rng & Units 04/22/2017 04/15/2017 04/08/2017  Glucose 65 - 99 mg/dL 91 105(H) 133(H)  BUN 6 - 20 mg/dL 15 14 22(H)  Creatinine 0.44 - 1.00 mg/dL 1.01(H) 0.95 1.14(H)  Sodium 135 - 145 mmol/L 138 138 140  Potassium 3.5 - 5.1 mmol/L 3.4(L) 3.7 3.7  Chloride 101 - 111 mmol/L 105 105 105  CO2 22 - 32 mmol/L '28 26 28  ' Calcium 8.9 - 10.3 mg/dL 8.5(L) 8.8(L) 8.5(L)  Total Protein 6.5 - 8.1 g/dL 6.8 7.0 6.9  Total Bilirubin 0.3 - 1.2 mg/dL 0.4 1.1 0.8  Alkaline Phos 38 - 126 U/L 140(H) 128(H) 143(H)  AST 15 - 41 U/L '19 21 24  ' ALT 14 - 54 U/L 12(L) 14 13(L)        PENDING LABS:    DIAGNOSTIC IMAGING:  CT chest/abd/pelvis: 01/09/17 *Images and radiologic reports reviewed independently and I agree with below findings.  CLINICAL DATA:  Pancreatic cancer with suspected liver metastases  EXAM: CT CHEST, ABDOMEN, AND PELVIS WITH CONTRAST  TECHNIQUE: Multidetector CT imaging of the chest, abdomen and pelvis was performed following the standard protocol during bolus administration of intravenous contrast.  CONTRAST:  176m ISOVUE-300 IOPAMIDOL (ISOVUE-300) INJECTION 61%  COMPARISON:  MRI abdomen dated 09/11/2016. CT chest dated 04/07/2016. CT pelvis dated 03/22/2015.  FINDINGS: CT CHEST FINDINGS  Cardiovascular: Heart is normal in size.  No pericardial effusion.  No evidence of thoracic aortic aneurysm. Atherosclerotic calcifications of the aortic arch.  Three vessel coronary atherosclerosis.  Right chest port terminates at the cavoatrial junction.  Mediastinum/Nodes: Calcified low right paratracheal node (series 3/image 38), within normal limits.  No suspicious mediastinal, hilar, or axillary lymphadenopathy.  Visualized thyroid is enlarged/ heterogeneous with bilateral thyroid nodules measuring up to  13 mm on the right (series 3/ image 9).  Lungs/Pleura: 2 mm subpleural nodule in the left lower lobe (series 8/ image 41), likely benign.  No suspicious pulmonary nodules.  Mild centrilobular and paraseptal emphysematous changes, upper lobe predominant.  No focal consolidation.  Mild biapical pleural-parenchymal scarring.  No pleural effusion or pneumothorax.  Musculoskeletal: Mild degenerative changes of the thoracic spine.  CT ABDOMEN PELVIS FINDINGS  Hepatobiliary: Multiple scattered hypoenhancing lesions measuring less than 10 mm in the liver (for example, series 3/images 76, 87, 102, and 103), approximately 8-10 in number. These are suspicious for metastatic disease.  Status post cholecystectomy. Mild intrahepatic and extrahepatic ductal dilatation. Dilated common duct, measuring 12 mm, although smoothly tapering at the ampulla.  Pancreas: 2.1 x 2.9 cm heterogeneously enhancing lesion in the pancreatic tail abutting the splenic hilum (series 9/ image 58), corresponding to known  primary pancreatic neoplasm. Although difficult to directly compare to prior MRI, this previously measured 1.7 x 2.6 cm.  Spleen: Heterogeneous perfusion of the spleen. 2.5 cm fluid density lesion in the central spleen (series 3/image 98).  Adrenals/Urinary Tract: Adrenal glands are within normal limits.  Numerous bilateral renal cysts of varying sizes and complexities, including a 2.5 cm hyperdense/hemorrhagic cyst along the posterior right upper kidney (series 3/image 90), benign (Bosniak II). Most of the cysts are simple (Bosniak I). No enhancing renal lesions. No hydronephrosis.  Bladder is mildly thick-walled although underdistended.  Stomach/Bowel: Stomach is within normal limits.  No evidence of bowel obstruction.  Appendix is not discretely visualized.  Mild left colonic stool burden.  Vascular/Lymphatic: No evidence of abdominal aortic  aneurysm.  Atherosclerotic calcifications of the abdominal aorta and branch vessels.  No suspicious abdominopelvic lymphadenopathy.  Reproductive: Uterus is unremarkable.  Bilateral ovaries are within normal limits.  Other: No abdominopelvic ascites.  Musculoskeletal: Degenerative changes of the lumbar spine, most prominent at L2-3.  IMPRESSION: 2.1 x 2.9 cm lesion in the pancreatic tail and abutting the splenic hilum, corresponding to known primary pancreatic neoplasm, mildly increased.  Scattered small hypoenhancing lesions in the liver measuring up to 10 mm, approximately 8-10 in number, suspicious for hepatic metastases.  No findings specific for metastatic disease in the chest.  Additional ancillary findings as above.   Electronically Signed   By: Julian Hy M.D.   On: 01/09/2017 15:09       PATHOLOGY:  Liver biopsy: 03/09/14     Foundation One results: 01/29/17             ASSESSMENT & PLAN:   Stage IV pancreatic cancer with liver mets:  -Metastatic disease noted 03/10/14 with biopsy confirmed liver mets. Received chemotherapy with Gemzar/Abraxane on days 1 & 8 every 28 day cycle; CA 19-9 normalized and subsequent imaging revealed response and later no residual detectable liver mets in 07/24/15. Chemotherapy drug holiday started in 02/2016, after 2 years of chemotherapy. MRI on 09/11/16 indicates progression of pancreatic tail lesion & right liver with concern for recurrent metastatic disease. CA 19-9 was rising as well. -Restaging CT chest/abd/pelvis on 01/09/17 revealed mildly increased lesion in pancreatic tail, as well as scattered small liver lesions, approximately 8-10 in number, suspicious for worsening liver mets. No findings for metastatic disease in the chest.   -Treatment changed to Gemzar on days 1 & 8, (day 15 to be omitted given previous cytopenias with previous chemotherapy) + Tarceva 100 mg daily; she started new  regimen on 01/28/17. -Foundation One ordered recently; results revealed KRAS and TP53 mutations, however there are no therapies approved for these mutations and her disease.  MSI status could not be determined; TMB also could not be determined.  -Restaging CT chest/abd/pelvis scheduled for tomorrow. Will follow up and call her with the results.  -CA 19-9 has been trending down. -Continue Gemzar/Tarceva as scheduled and prescribed.  Labs reviewed, proceed with Gemzar today as scheduled.    Dispo:  -Restaging CT chest/abd/pelvis are scheduled for tomorrow.  -Return to cancer center for follow up with cycle 5 of chemo.   All questions were answered to patient's stated satisfaction. Encouraged patient to call with any new concerns or questions before her next visit to the cancer center and we can certain see her sooner, if needed.    Twana First, MD

## 2017-04-23 ENCOUNTER — Other Ambulatory Visit (HOSPITAL_COMMUNITY): Payer: Self-pay

## 2017-04-23 ENCOUNTER — Ambulatory Visit (HOSPITAL_COMMUNITY)
Admission: RE | Admit: 2017-04-23 | Discharge: 2017-04-23 | Disposition: A | Discharge status: Home / Self Care | Payer: Medicare HMO | Source: Ambulatory Visit | Attending: Adult Health | Admitting: Adult Health

## 2017-04-23 ENCOUNTER — Telehealth (HOSPITAL_COMMUNITY): Payer: Self-pay

## 2017-04-23 ENCOUNTER — Encounter (HOSPITAL_COMMUNITY): Payer: Self-pay

## 2017-04-23 DIAGNOSIS — C259 Malignant neoplasm of pancreas, unspecified: Secondary | ICD-10-CM

## 2017-04-23 DIAGNOSIS — C787 Secondary malignant neoplasm of liver and intrahepatic bile duct: Principal | ICD-10-CM

## 2017-04-23 DIAGNOSIS — T50995D Adverse effect of other drugs, medicaments and biological substances, subsequent encounter: Secondary | ICD-10-CM

## 2017-04-23 LAB — CANCER ANTIGEN 19-9: CAN 19-9: 95 U/mL — AB (ref 0–35)

## 2017-04-23 MED ORDER — PREDNISONE 50 MG PO TABS: ORAL_TABLET | ORAL | 0 refills | Status: DC

## 2017-04-23 MED ORDER — DIPHENHYDRAMINE HCL 50 MG PO TABS: ORAL_TABLET | ORAL | 0 refills | Status: DC

## 2017-04-23 NOTE — Telephone Encounter (Signed)
Received call from CT. Patient had taken her pre- medication so she could have the CT with contrast but had not drank the oral contrast. She told the CT tech that someone at [facility] told her she didn't have to drink the contrast so she poured it down the drain. CT wanted to know if the scan could be with IV contrast only. Reviewed with Elzie Rings, NP. She said it was okay to do the scan without oral contrast.   CT called back and said since patient took her medication at 11 and the CT scan was not going to start until 1 pm , the radiologist said she could not have the scan today. She has to have the iv contrast within the hour of taking her benadryl and prednisone.   reviewed with NP. Rescheduled patients CT scans and gave her new instructions on when to take the pre meds. Patient verbalized understanding.

## 2017-04-28 ENCOUNTER — Telehealth (HOSPITAL_COMMUNITY): Payer: Self-pay | Admitting: Emergency Medicine

## 2017-04-28 NOTE — Telephone Encounter (Signed)
Tried to call pt no answer.  Will try again.  Pt called and stated that she can not find her medicine that she gets from "out of town."  She states she thought they sent two bottles and one bottle only had 7 in it and when she finished with that bottle she could not find the other bottle.  Her bottle of Tarceva with 30 tablets was delivered by [facility] and signed for on 12/13.

## 2017-04-29 ENCOUNTER — Encounter (HOSPITAL_COMMUNITY): Payer: Self-pay

## 2017-04-29 ENCOUNTER — Ambulatory Visit (HOSPITAL_COMMUNITY): Payer: Medicare HMO

## 2017-04-29 ENCOUNTER — Other Ambulatory Visit (HOSPITAL_COMMUNITY): Payer: Self-pay | Admitting: Emergency Medicine

## 2017-04-29 ENCOUNTER — Encounter (HOSPITAL_BASED_OUTPATIENT_CLINIC_OR_DEPARTMENT_OTHER): Payer: Medicare HMO

## 2017-04-29 VITALS — BP 170/58 | HR 60 | Temp 98.0°F | Resp 18 | Wt 125.0 lb

## 2017-04-29 DIAGNOSIS — G8929 Other chronic pain: Secondary | ICD-10-CM | POA: Diagnosis not present

## 2017-04-29 DIAGNOSIS — C252 Malignant neoplasm of tail of pancreas: Secondary | ICD-10-CM

## 2017-04-29 DIAGNOSIS — Z66 Do not resuscitate: Secondary | ICD-10-CM | POA: Diagnosis not present

## 2017-04-29 DIAGNOSIS — C787 Secondary malignant neoplasm of liver and intrahepatic bile duct: Secondary | ICD-10-CM | POA: Diagnosis not present

## 2017-04-29 DIAGNOSIS — E876 Hypokalemia: Secondary | ICD-10-CM | POA: Diagnosis not present

## 2017-04-29 DIAGNOSIS — Z5111 Encounter for antineoplastic chemotherapy: Secondary | ICD-10-CM

## 2017-04-29 DIAGNOSIS — Z9221 Personal history of antineoplastic chemotherapy: Secondary | ICD-10-CM | POA: Diagnosis not present

## 2017-04-29 DIAGNOSIS — M549 Dorsalgia, unspecified: Secondary | ICD-10-CM | POA: Diagnosis not present

## 2017-04-29 DIAGNOSIS — C259 Malignant neoplasm of pancreas, unspecified: Secondary | ICD-10-CM

## 2017-04-29 DIAGNOSIS — R69 Illness, unspecified: Secondary | ICD-10-CM | POA: Diagnosis not present

## 2017-04-29 DIAGNOSIS — R350 Frequency of micturition: Secondary | ICD-10-CM | POA: Diagnosis not present

## 2017-04-29 LAB — COMPREHENSIVE METABOLIC PANEL
ALK PHOS: 140 U/L — AB (ref 38–126)
ALT: 17 U/L (ref 14–54)
AST: 24 U/L (ref 15–41)
Albumin: 3.3 g/dL — ABNORMAL LOW (ref 3.5–5.0)
Anion gap: 11 (ref 5–15)
BILIRUBIN TOTAL: 0.7 mg/dL (ref 0.3–1.2)
BUN: 16 mg/dL (ref 6–20)
CALCIUM: 8.8 mg/dL — AB (ref 8.9–10.3)
CO2: 26 mmol/L (ref 22–32)
CREATININE: 1.11 mg/dL — AB (ref 0.44–1.00)
Chloride: 104 mmol/L (ref 101–111)
GFR, EST AFRICAN AMERICAN: 51 mL/min — AB (ref >60)
GFR, EST NON AFRICAN AMERICAN: 44 mL/min — AB (ref >60)
Glucose, Bld: 156 mg/dL — ABNORMAL HIGH (ref 65–99)
Potassium: 4.2 mmol/L (ref 3.5–5.1)
Sodium: 141 mmol/L (ref 135–145)
TOTAL PROTEIN: 7 g/dL (ref 6.5–8.1)

## 2017-04-29 LAB — CBC WITH DIFFERENTIAL/PLATELET
BASOS PCT: 1 %
Basophils Absolute: 0 10*3/uL (ref 0.0–0.1)
EOS ABS: 0 10*3/uL (ref 0.0–0.7)
EOS PCT: 1 %
HCT: 28.4 % — ABNORMAL LOW (ref 36.0–46.0)
HEMOGLOBIN: 9.3 g/dL — AB (ref 12.0–15.0)
Lymphocytes Relative: 43 %
Lymphs Abs: 1.2 10*3/uL (ref 0.7–4.0)
MCH: 29.4 pg (ref 26.0–34.0)
MCHC: 32.7 g/dL (ref 30.0–36.0)
MCV: 89.9 fL (ref 78.0–100.0)
Monocytes Absolute: 0.3 10*3/uL (ref 0.1–1.0)
Monocytes Relative: 10 %
NEUTROS PCT: 45 %
Neutro Abs: 1.3 10*3/uL — ABNORMAL LOW (ref 1.7–7.7)
PLATELETS: 165 10*3/uL (ref 150–400)
RBC: 3.16 MIL/uL — AB (ref 3.87–5.11)
RDW: 17.9 % — ABNORMAL HIGH (ref 11.5–15.5)
WBC: 2.8 10*3/uL — AB (ref 4.0–10.5)

## 2017-04-29 MED ORDER — HEPARIN SOD (PORK) LOCK FLUSH 100 UNIT/ML IV SOLN
500.0000 [IU] | Freq: Once | INTRAVENOUS | Status: AC | PRN
  Administered 2017-04-29: 500 [IU]
  Filled 2017-04-29: qty 5

## 2017-04-29 MED ORDER — ERLOTINIB HCL 100 MG PO TABS: 100.0000 mg | ORAL_TABLET | Freq: Every day | ORAL | 1 refills | Status: DC

## 2017-04-29 MED ORDER — SODIUM CHLORIDE 0.9 % IV SOLN
800.0000 mg/m2 | Freq: Once | INTRAVENOUS | Status: AC
  Administered 2017-04-29: 1292 mg via INTRAVENOUS
  Filled 2017-04-29: qty 10.52

## 2017-04-29 MED ORDER — PROCHLORPERAZINE MALEATE 10 MG PO TABS
10.0000 mg | ORAL_TABLET | Freq: Once | ORAL | Status: AC
  Administered 2017-04-29: 10 mg via ORAL
  Filled 2017-04-29: qty 1

## 2017-04-29 MED ORDER — SODIUM CHLORIDE 0.9% FLUSH
10.0000 mL | INTRAVENOUS | Status: DC | PRN
  Administered 2017-04-29: 10 mL
  Filled 2017-04-29: qty 10

## 2017-04-29 MED ORDER — SODIUM CHLORIDE 0.9 % IV SOLN
Freq: Once | INTRAVENOUS | Status: AC
  Administered 2017-04-29: 12:00:00 via INTRAVENOUS

## 2017-04-29 NOTE — Progress Notes (Signed)
Pt can not find the prescription of her tarceva.  tarceva refilled.  Script given to [employer].

## 2017-04-29 NOTE — Progress Notes (Signed)
[place] reviewed with Dr. Talbert Cage and pt approved for chemo therapy tx today                                           [name] tolerated chemo tx well without complaints or incident. Pt spoke with Joanne Gavel RN regarding her script for Tarceva which she has been taking as prescribed. VSS upon discharge. Pt discharged self ambulatory using cane in satisfactory condition

## 2017-04-29 NOTE — Patient Instructions (Signed)
[facility] Discharge Instructions for Patients Receiving Chemotherapy   Beginning January 23rd 2017 lab work for the Cancer Center will be done in the  Main lab at [place] on 1st floor. If you have a lab appointment with the Cancer Center please come in thru the  Main Entrance and check in at the main information desk   Today you received the following chemotherapy agents Gemzar. Follow-up as scheduled. Call clinic for any questions or concerns  To help prevent nausea and vomiting after your treatment, we encourage you to take your nausea medication   If you develop nausea and vomiting, or diarrhea that is not controlled by your medication, call the clinic.  The clinic phone number is [phone]. Office hours are Monday-Friday 8:30am-5:00pm.  BELOW ARE SYMPTOMS THAT SHOULD BE REPORTED IMMEDIATELY:  *FEVER GREATER THAN 101.0 F  *CHILLS WITH OR WITHOUT FEVER  NAUSEA AND VOMITING THAT IS NOT CONTROLLED WITH YOUR NAUSEA MEDICATION  *UNUSUAL SHORTNESS OF BREATH  *UNUSUAL BRUISING OR BLEEDING  TENDERNESS IN MOUTH AND THROAT WITH OR WITHOUT PRESENCE OF ULCERS  *URINARY PROBLEMS  *BOWEL PROBLEMS  UNUSUAL RASH Items with * indicate a potential emergency and should be followed up as soon as possible. If you have an emergency after office hours please contact your primary care physician or go to the nearest emergency department.  Please call the clinic during office hours if you have any questions or concerns.   You may also contact the Patient Navigator at [phone] should you have any questions or need assistance in obtaining follow up care.      Resources For Cancer Patients and their Caregivers ? American Cancer Society: Can assist with transportation, wigs, general needs, runs Look Good Feel Better.        1-888-227-6333 ? Cancer Care: Provides financial assistance, online support groups, medication/co-pay assistance.  1-800-813-HOPE  (4673) ? [facility] Assists [place] cancer patients and their families through emotional , educational and financial support.  336-427-4357 ? [place] DSS Where to apply for food stamps, Medicaid and utility assistance. 336-342-1394 ? [facility]: Transportation to medical appointments. 336-347-2287 ? Social Security Administration: May apply for disability if have a Stage IV cancer. 336-342-7796 1-800-772-1213 ? [place] Aging, Disability and Transit Services: Assists with nutrition, care and transit needs. 336-349-2343         

## 2017-05-04 ENCOUNTER — Ambulatory Visit (HOSPITAL_COMMUNITY)
Admission: RE | Admit: 2017-05-04 | Discharge: 2017-05-04 | Disposition: A | Discharge status: Home / Self Care | Payer: Medicare HMO | Source: Ambulatory Visit | Attending: Adult Health | Admitting: Adult Health

## 2017-05-04 DIAGNOSIS — N200 Calculus of kidney: Secondary | ICD-10-CM | POA: Insufficient documentation

## 2017-05-04 DIAGNOSIS — N281 Cyst of kidney, acquired: Secondary | ICD-10-CM | POA: Diagnosis not present

## 2017-05-04 DIAGNOSIS — M5136 Other intervertebral disc degeneration, lumbar region: Secondary | ICD-10-CM | POA: Insufficient documentation

## 2017-05-04 DIAGNOSIS — I2510 Atherosclerotic heart disease of native coronary artery without angina pectoris: Secondary | ICD-10-CM | POA: Insufficient documentation

## 2017-05-04 DIAGNOSIS — J439 Emphysema, unspecified: Secondary | ICD-10-CM | POA: Insufficient documentation

## 2017-05-04 DIAGNOSIS — M47816 Spondylosis without myelopathy or radiculopathy, lumbar region: Secondary | ICD-10-CM | POA: Insufficient documentation

## 2017-05-04 DIAGNOSIS — E042 Nontoxic multinodular goiter: Secondary | ICD-10-CM | POA: Diagnosis not present

## 2017-05-04 DIAGNOSIS — C259 Malignant neoplasm of pancreas, unspecified: Secondary | ICD-10-CM | POA: Insufficient documentation

## 2017-05-04 DIAGNOSIS — C787 Secondary malignant neoplasm of liver and intrahepatic bile duct: Secondary | ICD-10-CM | POA: Diagnosis present

## 2017-05-04 DIAGNOSIS — I700 Atherosclerosis of aorta: Secondary | ICD-10-CM | POA: Diagnosis not present

## 2017-05-04 MED ORDER — IOPAMIDOL (ISOVUE-300) INJECTION 61%
100.0000 mL | Freq: Once | INTRAVENOUS | Status: AC | PRN
  Administered 2017-05-04: 100 mL via INTRAVENOUS

## 2017-05-06 ENCOUNTER — Ambulatory Visit (HOSPITAL_COMMUNITY): Payer: Medicare HMO

## 2017-05-20 ENCOUNTER — Other Ambulatory Visit: Payer: Self-pay

## 2017-05-20 ENCOUNTER — Inpatient Hospital Stay (HOSPITAL_BASED_OUTPATIENT_CLINIC_OR_DEPARTMENT_OTHER): Payer: Medicare HMO | Admitting: Hematology and Oncology

## 2017-05-20 ENCOUNTER — Encounter (HOSPITAL_COMMUNITY): Payer: Self-pay | Admitting: Hematology and Oncology

## 2017-05-20 ENCOUNTER — Inpatient Hospital Stay (HOSPITAL_COMMUNITY): Payer: Medicare HMO | Attending: Hematology and Oncology

## 2017-05-20 VITALS — BP 155/52 | HR 58 | Temp 98.5°F | Resp 16 | Wt 127.0 lb

## 2017-05-20 DIAGNOSIS — C252 Malignant neoplasm of tail of pancreas: Secondary | ICD-10-CM | POA: Insufficient documentation

## 2017-05-20 DIAGNOSIS — C787 Secondary malignant neoplasm of liver and intrahepatic bile duct: Secondary | ICD-10-CM

## 2017-05-20 DIAGNOSIS — Z800 Family history of malignant neoplasm of digestive organs: Secondary | ICD-10-CM | POA: Diagnosis not present

## 2017-05-20 DIAGNOSIS — Z803 Family history of malignant neoplasm of breast: Secondary | ICD-10-CM | POA: Insufficient documentation

## 2017-05-20 DIAGNOSIS — C259 Malignant neoplasm of pancreas, unspecified: Secondary | ICD-10-CM

## 2017-05-20 DIAGNOSIS — Z5111 Encounter for antineoplastic chemotherapy: Secondary | ICD-10-CM | POA: Diagnosis not present

## 2017-05-20 DIAGNOSIS — I10 Essential (primary) hypertension: Secondary | ICD-10-CM | POA: Insufficient documentation

## 2017-05-20 LAB — CBC WITH DIFFERENTIAL/PLATELET
Basophils Absolute: 0.1 10*3/uL (ref 0.0–0.1)
Basophils Relative: 1 %
EOS PCT: 4 %
Eosinophils Absolute: 0.2 10*3/uL (ref 0.0–0.7)
HCT: 29.6 % — ABNORMAL LOW (ref 36.0–46.0)
Hemoglobin: 9.5 g/dL — ABNORMAL LOW (ref 12.0–15.0)
LYMPHS ABS: 1.3 10*3/uL (ref 0.7–4.0)
LYMPHS PCT: 30 %
MCH: 29 pg (ref 26.0–34.0)
MCHC: 32.1 g/dL (ref 30.0–36.0)
MCV: 90.2 fL (ref 78.0–100.0)
MONO ABS: 0.6 10*3/uL (ref 0.1–1.0)
Monocytes Relative: 15 %
Neutro Abs: 2.2 10*3/uL (ref 1.7–7.7)
Neutrophils Relative %: 50 %
PLATELETS: 227 10*3/uL (ref 150–400)
RBC: 3.28 MIL/uL — ABNORMAL LOW (ref 3.87–5.11)
RDW: 17.5 % — AB (ref 11.5–15.5)
WBC: 4.4 10*3/uL (ref 4.0–10.5)

## 2017-05-20 LAB — COMPREHENSIVE METABOLIC PANEL
ALBUMIN: 3.3 g/dL — AB (ref 3.5–5.0)
ALT: 18 U/L (ref 14–54)
AST: 28 U/L (ref 15–41)
Alkaline Phosphatase: 167 U/L — ABNORMAL HIGH (ref 38–126)
Anion gap: 9 (ref 5–15)
BUN: 22 mg/dL — ABNORMAL HIGH (ref 6–20)
CHLORIDE: 104 mmol/L (ref 101–111)
CO2: 26 mmol/L (ref 22–32)
Calcium: 8.5 mg/dL — ABNORMAL LOW (ref 8.9–10.3)
Creatinine, Ser: 0.99 mg/dL (ref 0.44–1.00)
GFR calc Af Amer: 58 mL/min — ABNORMAL LOW (ref >60)
GFR calc non Af Amer: 50 mL/min — ABNORMAL LOW (ref >60)
GLUCOSE: 104 mg/dL — AB (ref 65–99)
POTASSIUM: 3.7 mmol/L (ref 3.5–5.1)
Sodium: 139 mmol/L (ref 135–145)
Total Bilirubin: 0.5 mg/dL (ref 0.3–1.2)
Total Protein: 6.7 g/dL (ref 6.5–8.1)

## 2017-05-20 MED ORDER — SODIUM CHLORIDE 0.9% FLUSH
10.0000 mL | INTRAVENOUS | Status: DC | PRN
  Administered 2017-05-20: 10 mL
  Filled 2017-05-20: qty 10

## 2017-05-20 MED ORDER — SODIUM CHLORIDE 0.9 % IV SOLN
Freq: Once | INTRAVENOUS | Status: AC
  Administered 2017-05-20: 12:00:00 via INTRAVENOUS

## 2017-05-20 MED ORDER — SODIUM CHLORIDE 0.9 % IV SOLN
800.0000 mg/m2 | Freq: Once | INTRAVENOUS | Status: AC
  Administered 2017-05-20: 1292 mg via INTRAVENOUS
  Filled 2017-05-20: qty 23.46

## 2017-05-20 MED ORDER — HEPARIN SOD (PORK) LOCK FLUSH 100 UNIT/ML IV SOLN
500.0000 [IU] | Freq: Once | INTRAVENOUS | Status: AC | PRN
  Administered 2017-05-20: 500 [IU]

## 2017-05-20 MED ORDER — PROCHLORPERAZINE MALEATE 10 MG PO TABS
10.0000 mg | ORAL_TABLET | Freq: Once | ORAL | Status: AC
  Administered 2017-05-20: 10 mg via ORAL

## 2017-05-20 MED ORDER — HEPARIN SOD (PORK) LOCK FLUSH 100 UNIT/ML IV SOLN
INTRAVENOUS | Status: AC
  Filled 2017-05-20: qty 5

## 2017-05-20 NOTE — Patient Instructions (Signed)
[facility] Discharge Instructions for Patients Receiving Chemotherapy   Beginning January 23rd 2017 lab work for the Cancer Center will be done in the  Main lab at [facility] on 1st floor. If you have a lab appointment with the Cancer Center please come in thru the  Main Entrance and check in at the main information desk   Today you received the following chemotherapy agents   To help prevent nausea and vomiting after your treatment, we encourage you to take your nausea medication     If you develop nausea and vomiting, or diarrhea that is not controlled by your medication, call the clinic.  The clinic phone number is [phone]. Office hours are Monday-Friday 8:30am-5:00pm.  BELOW ARE SYMPTOMS THAT SHOULD BE REPORTED IMMEDIATELY:  *FEVER GREATER THAN 101.0 F  *CHILLS WITH OR WITHOUT FEVER  NAUSEA AND VOMITING THAT IS NOT CONTROLLED WITH YOUR NAUSEA MEDICATION  *UNUSUAL SHORTNESS OF BREATH  *UNUSUAL BRUISING OR BLEEDING  TENDERNESS IN MOUTH AND THROAT WITH OR WITHOUT PRESENCE OF ULCERS  *URINARY PROBLEMS  *BOWEL PROBLEMS  UNUSUAL RASH Items with * indicate a potential emergency and should be followed up as soon as possible. If you have an emergency after office hours please contact your primary care physician or go to the nearest emergency department.  Please call the clinic during office hours if you have any questions or concerns.   You may also contact the Patient Navigator at [phone] should you have any questions or need assistance in obtaining follow up care.      Resources For Cancer Patients and their Caregivers ? American Cancer Society: Can assist with transportation, wigs, general needs, runs Look Good Feel Better.        1-888-227-6333 ? Cancer Care: Provides financial assistance, online support groups, medication/co-pay assistance.  1-800-813-HOPE (4673) ? [facility] Assists [place] cancer  patients and their families through emotional , educational and financial support.  336-427-4357 ? [place] DSS Where to apply for food stamps, Medicaid and utility assistance. 336-342-1394 ? [facility]: Transportation to medical appointments. 336-347-2287 ? Social Security Administration: May apply for disability if have a Stage IV cancer. 336-342-7796 1-800-772-1213 ? [place] Aging, Disability and Transit Services: Assists with nutrition, care and transit needs. 336-349-2343         

## 2017-05-20 NOTE — Progress Notes (Signed)
MD reviewed labs today, proceed with treatment.  Treatment given per orders. Patient tolerated it well without problems. Vitals stable and discharged home from clinic ambulatory. Follow up as scheduled.

## 2017-05-21 ENCOUNTER — Other Ambulatory Visit (HOSPITAL_COMMUNITY): Payer: Self-pay | Admitting: Emergency Medicine

## 2017-05-21 ENCOUNTER — Other Ambulatory Visit: Payer: Self-pay | Admitting: Family Medicine

## 2017-05-21 LAB — CANCER ANTIGEN 19-9: CA 19-9: 98 U/mL — ABNORMAL HIGH (ref 0–35)

## 2017-05-21 MED ORDER — ERLOTINIB HCL 100 MG PO TABS: 100.0000 mg | ORAL_TABLET | Freq: Every day | ORAL | 1 refills | Status: DC

## 2017-05-21 NOTE — Telephone Encounter (Signed)
Seen [phone] 

## 2017-05-21 NOTE — Progress Notes (Signed)
tarceva refilled 

## 2017-05-27 ENCOUNTER — Inpatient Hospital Stay (HOSPITAL_COMMUNITY): Payer: Medicare HMO

## 2017-05-27 ENCOUNTER — Other Ambulatory Visit: Payer: Self-pay

## 2017-05-27 ENCOUNTER — Encounter (HOSPITAL_COMMUNITY): Payer: Self-pay

## 2017-05-27 DIAGNOSIS — C259 Malignant neoplasm of pancreas, unspecified: Secondary | ICD-10-CM

## 2017-05-27 DIAGNOSIS — Z5111 Encounter for antineoplastic chemotherapy: Secondary | ICD-10-CM | POA: Diagnosis not present

## 2017-05-27 DIAGNOSIS — C787 Secondary malignant neoplasm of liver and intrahepatic bile duct: Principal | ICD-10-CM

## 2017-05-27 DIAGNOSIS — Z803 Family history of malignant neoplasm of breast: Secondary | ICD-10-CM | POA: Diagnosis not present

## 2017-05-27 DIAGNOSIS — Z800 Family history of malignant neoplasm of digestive organs: Secondary | ICD-10-CM | POA: Diagnosis not present

## 2017-05-27 DIAGNOSIS — C252 Malignant neoplasm of tail of pancreas: Secondary | ICD-10-CM | POA: Diagnosis not present

## 2017-05-27 DIAGNOSIS — I10 Essential (primary) hypertension: Secondary | ICD-10-CM | POA: Diagnosis not present

## 2017-05-27 LAB — CBC WITH DIFFERENTIAL/PLATELET
Basophils Absolute: 0 10*3/uL (ref 0.0–0.1)
Basophils Relative: 0 %
Eosinophils Absolute: 0.1 10*3/uL (ref 0.0–0.7)
Eosinophils Relative: 3 %
HEMATOCRIT: 28.8 % — AB (ref 36.0–46.0)
HEMOGLOBIN: 9.4 g/dL — AB (ref 12.0–15.0)
LYMPHS ABS: 1.3 10*3/uL (ref 0.7–4.0)
Lymphocytes Relative: 54 %
MCH: 29.3 pg (ref 26.0–34.0)
MCHC: 32.6 g/dL (ref 30.0–36.0)
MCV: 89.7 fL (ref 78.0–100.0)
MONOS PCT: 9 %
Monocytes Absolute: 0.2 10*3/uL (ref 0.1–1.0)
NEUTROS ABS: 0.8 10*3/uL — AB (ref 1.7–7.7)
NEUTROS PCT: 34 %
Platelets: 116 10*3/uL — ABNORMAL LOW (ref 150–400)
RBC: 3.21 MIL/uL — ABNORMAL LOW (ref 3.87–5.11)
RDW: 16.8 % — ABNORMAL HIGH (ref 11.5–15.5)
WBC: 2.3 10*3/uL — ABNORMAL LOW (ref 4.0–10.5)

## 2017-05-27 LAB — COMPREHENSIVE METABOLIC PANEL
ALBUMIN: 3.3 g/dL — AB (ref 3.5–5.0)
ALT: 18 U/L (ref 14–54)
ANION GAP: 9 (ref 5–15)
AST: 23 U/L (ref 15–41)
Alkaline Phosphatase: 146 U/L — ABNORMAL HIGH (ref 38–126)
BUN: 20 mg/dL (ref 6–20)
CHLORIDE: 105 mmol/L (ref 101–111)
CO2: 25 mmol/L (ref 22–32)
Calcium: 8.6 mg/dL — ABNORMAL LOW (ref 8.9–10.3)
Creatinine, Ser: 1.06 mg/dL — ABNORMAL HIGH (ref 0.44–1.00)
GFR calc non Af Amer: 46 mL/min — ABNORMAL LOW (ref >60)
GFR, EST AFRICAN AMERICAN: 54 mL/min — AB (ref >60)
Glucose, Bld: 103 mg/dL — ABNORMAL HIGH (ref 65–99)
Potassium: 3.7 mmol/L (ref 3.5–5.1)
Sodium: 139 mmol/L (ref 135–145)
Total Bilirubin: 0.7 mg/dL (ref 0.3–1.2)
Total Protein: 7.1 g/dL (ref 6.5–8.1)

## 2017-05-27 MED ORDER — HEPARIN SOD (PORK) LOCK FLUSH 100 UNIT/ML IV SOLN
500.0000 [IU] | Freq: Once | INTRAVENOUS | Status: AC
  Administered 2017-05-27: 500 [IU] via INTRAVENOUS

## 2017-05-27 NOTE — Progress Notes (Signed)
Defer today's tx x 1 week per Fortunato Curling, NP d/t ANC of 800.  Pt aware and verbalizes understanding of plan.  Discharged ambulatory.

## 2017-05-28 ENCOUNTER — Other Ambulatory Visit: Payer: Self-pay | Admitting: Family Medicine

## 2017-05-31 NOTE — Assessment & Plan Note (Signed)
84 y.o.  female with diagnosis of stage IV pancreatic adenocarcinoma metastatic to the liver.  Currently with improving disease based on most recent disease assessment by CT of the chest/abdomen/pelvis while receiving therapy with gemcitabine and erlotinib. Clinical evaluation lab work today permissive to proceed with therapy as previously scheduled.  Plan: --Proceed with the next cycle of gemcitabine and erlotinib.  We will continue with the previously established dose reduction of gemcitabine which is administered on only day 1 and 8 of a 28-day cycle. Continue erlotinib (Tarceva) at 100 mg daily. --Return to clinic in 4 weeks with labs, clinic visit, continued chemotherapy.   -- Next disease assessment with CT of the chest/abdomen/pelvis in March 2019.

## 2017-05-31 NOTE — Progress Notes (Signed)
[facility] Follow-up Visit:  Assessment: Pancreatic cancer metastasized to liver [facility]) 84 y.o.  female with diagnosis of stage IV pancreatic adenocarcinoma metastatic to the liver.  Currently with improving disease based on most recent disease assessment by CT of the chest/abdomen/pelvis while receiving therapy with gemcitabine and erlotinib. Clinical evaluation lab work today permissive to proceed with therapy as previously scheduled.  Plan: --Proceed with the next cycle of gemcitabine and erlotinib.  We will continue with the previously established dose reduction of gemcitabine which is administered on only day 1 and 8 of a 28-day cycle. Continue erlotinib (Tarceva) at 100 mg daily. --Return to clinic in 4 weeks with labs, clinic visit, continued chemotherapy.   -- Next disease assessment with CT of the chest/abdomen/pelvis in March 2019.  Voice recognition software was used and creation of this note. Despite my best effort at editing the text, some misspelling/errors may have occurred.  Orders Placed This Encounter  Procedures  . CBC with Differential    Standing Status:   Future    Standing Expiration Date:   05/20/2018  . Comprehensive metabolic panel    Standing Status:   Future    Standing Expiration Date:   05/20/2018  . Magnesium    Standing Status:   Future    Standing Expiration Date:   05/20/2018    Cancer Staging Pancreatic cancer metastasized to liver [facility]) Staging form: Pancreas, AJCC 7th Edition - Clinical: Stage IV (T3, N1, M1) - Signed by Baird Cancer, PA-C on 03/16/2014 - Pathologic: No stage assigned - Unsigned   All questions were answered.  . The patient knows to call the clinic with any problems, questions or concerns.  This note was electronically signed.    History of Presenting Illness [name] 24 y.o. presenting to the [place] for diagnosis of stage IV pancreatic adenocarcinoma metastatic to the liver, currently  receiving palliative systemic therapy with gemcitabine and erlotinib (Tarceva).  In the interim, patient underwent disease assessment with CT of the chest/abdomen/pelvis on 05/04/17 demonstrating interval decrease in the size of the pancreatic tail mass and stable disease in the liver.  Patient denies any new complaints since last visit to the clinic..  Oncological/hematological History:   Pancreatic cancer metastasized to liver ([place])   03/10/2014 Initial Diagnosis    Pancreatic cancer metastasized to liver      03/22/2014 - 03/05/2016 Chemotherapy    Abraxane/Gemzar days 1, 8, every 28 days.  Day 15 was cancelled due to leukopenia and thrombocytopenia on day 15 cycle 1.      05/24/2014 Treatment Plan Change    Day 8 of cycle 3 is held with ANC of 1.1      06/05/2014 Imaging    CT C/A/P . Interval decrease in size of the pancreatic tail mass.Improved hepatic metastatic disease. No new lesions. No CT findings for metastatic disease involving the chest.      08/09/2014 Tumor Marker    CA 19-9= 33 (WNL)      10/26/2014 Imaging    MRI- Continued interval decrease in size of the hepatic metastatic lesions and no new lesions are identified. Continued decrease in size of the pancreatic tail lesion.      01/03/2015 Tumor Marker    Results for [name], [name] (MRN [id number]) as of 01/18/2015 12:52  01/03/2015 10:00 CA 19-9: 14       01/17/2015 Imaging    MRI- Response to therapy of hepatic metastasis.  Similar size of a pancreatic tail  lesion.      03/20/2015 Imaging    MRI-L spine- Severe disc space narrowing at L2-L3, with endplate reactive changes. Large disc extrusion into the ventral epidural space,central to the RIGHT with a cephalad migrated free fragment. Additional Large disc extrusion into the retroperitone...      03/22/2015 Imaging    CT pelvis- No evidence for metastatic disease within the pelvis.      07/24/2015 Imaging    MRI abd- Continued response to therapy, with no  residual detectable liver metastases. No new sites of metastatic disease in the abdomen.       08/15/2015 Code Status     She confirms desire for DNR status.      12/21/2015 Imaging    MRI liver- Severe image degradation due to motion artifact reducing diagnostic sensitivity and specificity. Reduce conspicuity of the pancreatic tail lesion suggesting further improvement. The original liver lesions have resolved.      03/05/2016 Treatment Plan Change    Chemotherapy holiday/Break after 2 years worth of treatment      04/07/2016 Imaging    CT chest- When compared to recent chest CT, new minimally displaced anterior left sixth rib fracture. Slight increase in subcarinal adenopathy      04/28/2016 Imaging    Bone density- BMD as determined from Femur Neck Right is 0.705 g/cm2 with a T-Score of -2.4. This patient is considered osteopenic according to [facility]) criteria. Compared with the prior study on 02/23/2013, the BMD of the lumbar spine/rt. femoral neck show a statistically significant decrease.       05/23/2016 Imaging    MRI abd- 1. Exam is significantly degraded by patient respiratory motion. Consider follow-up exams with CT abdomen with without contrast per pancreatic protocol. 2. Fullness in tail of pancreas is more prominent and potentially increased in size. Recommend close attention on follow-up. (Consider CT as above) 3. No explanation for back pain.      09/11/2016 Imaging    MRI pancreas- Interval progression of pancreatic tail lesion. New differential perfusion right liver with areas of heterogeneity. Underlying metastatic disease in this region not excluded.      09/11/2016 Progression    MRi in conjunction with rising CA 19-9 are indicative of relapse of disease.      01/09/2017 Progression    CT C/A/P: 2.1 x 2.9 cm lesion in the pancreatic tail and abutting the splenic hilum, corresponding to known primary pancreatic neoplasm,  mildly increased.  Scattered small hypoenhancing lesions in the liver measuring up to 10 mm, approximately 8-10 in number, suspicious for hepatic metastases.  No findings specific for metastatic disease in the chest.  Additional ancillary findings as above.        Medical History: Past Medical History:  Diagnosis Date  . Anemia due to antineoplastic chemotherapy 09/12/2015   Started Aranesp 500 mcg on 09/12/2015  . Anxiety   . Chronic diarrhea   . Depression   . DNR (do not resuscitate) 08/16/2015  . Erosive esophagitis   . GERD (gastroesophageal reflux disease)   . Hx of adenomatous colonic polyps    tubular adenomas, last found in 2008  . Hyperplastic colon polyp 03/19/10   tcs by Dr. Gala Romney  . Hypertension 20 years   . Kidney stone    hx/ crushed   . Opioid contract exists 04/18/2015   With Dr. Moshe Cipro  . Pancreatic cancer ([place]) 02/2014  . Pancreatic cancer metastasized to liver ([place]) 03/10/2014  . Schatzki's ring 08/26/10  Last dilated on EGD by Dr. Trevor Iha [facility], linear gastric erosions, BI hemigastrectomy    Surgical History: Past Surgical History:  Procedure Laterality Date  . BREAST LUMPECTOMY Left   . bunion removal     from both feet   . CHOLECYSTECTOMY  1965   . COLONOSCOPY  03/19/2010   DR Gala Romney,, normal TI, pancolonic diverticula, random colon bx neg., hyperplastic polyps removed  . ESOPHAGOGASTRODUODENOSCOPY  11/22/2003   DR Gala Romney, erosive RE, Billroth I  . ESOPHAGOGASTRODUODENOSCOPY  08/26/10   Dr. Gala Romney- moderate severe ERE, Scahtzki ring s/p dilation, Billroth I, linear gastric erosions, bx-gastric xanthelasma  . LEFT SHOULDER SURGERY  2009   DR HARRISON  . ORIF ANKLE FRACTURE Right 05/22/2013   Procedure: OPEN REDUCTION INTERNAL FIXATION (ORIF) RIGHT ANKLE FRACTURE;  Surgeon: Sanjuana Kava, MD;  Location: [facility];  Service: Orthopedics;  Laterality: Right;  . stomach ulcer  50 years ago    had some of her stomach removed     Family  History: Family History  Problem Relation Age of Onset  . Hypertension Mother   . Kidney failure Brother        on dialysis  . Diabetes Sister   . Diabetes Sister   . Hypertension Father   . Kidney failure Sister   . Kidney Stones Brother   . Breast cancer Son   . Gout Son   . Liver disease Neg Hx   . Colon cancer Neg Hx     Social History: Social History   Socioeconomic History  . Marital status: Widowed    Spouse name: Not on file  . Number of children: 2  . Years of education: Not on file  . Highest education level: Not on file  Social Needs  . Financial resource strain: Not on file  . Food insecurity - worry: Not on file  . Food insecurity - inability: Not on file  . Transportation needs - medical: Not on file  . Transportation needs - non-medical: Not on file  Occupational History  . Occupation: retired from Estate manager/land agent: RETIRED  Tobacco Use  . Smoking status: Current Some Day Smoker    Packs/day: 0.50    Years: 60.00    Pack years: 30.00    Types: Cigarettes    Last attempt to quit: 09/09/2016    Years since quitting: 0.7  . Smokeless tobacco: Never Used  Substance and Sexual Activity  . Alcohol use: No  . Drug use: No  . Sexual activity: No  Other Topics Concern  . Not on file  Social History Narrative  . Not on file    Allergies: Allergies  Allergen Reactions  . Iohexol Hives and Shortness Of Breath    PATIENT HAD TO BE TAKEN TO ED, C/O WHELPS, HIVES, DIFFICULTY BREATHING. PATIENT GIVEN IV CONTRAST AFTER 13 HOUR PRE MEDS ON 01/09/2017 WITH NO REACTION NOTED  . Aciphex [Rabeprazole Sodium] Other (See Comments)    unknown  . Amlodipine Besylate-Valsartan     Rash to high-dose (5/320)  . Esomeprazole Magnesium   . Omeprazole Other (See Comments)    Patient states "medication didn't work"  . Ciprofloxacin Rash  . Penicillins Swelling and Rash    Has patient had a PCN reaction causing immediate rash, facial/tongue/throat swelling, SOB or  lightheadedness with hypotension: Yes Has patient had a PCN reaction causing severe rash involving mucus membranes or skin necrosis: Yes Has patient had a PCN reaction that required hospitalization: No Has patient had a  PCN reaction occurring within the last 10 years: yes If all of the above answers are "NO", then may proceed with Cephalosporin use.     Medications:  Current Outpatient Medications  Medication Sig Dispense Refill  . acetaminophen (TYLENOL) 500 MG tablet Take 500 mg by mouth every 6 (six) hours as needed for mild pain or moderate pain.     Marland Kitchen amLODipine (NORVASC) 10 MG tablet TAKE 1 TABLET BY MOUTH EVERY DAY 90 tablet 1  . Calcium-Phosphorus-Vitamin D [phone] MG-MG-UNIT CHEW Chew 2 tablets by mouth daily.    . clonazePAM (KLONOPIN) 0.5 MG tablet TAKE 1 TABLET BY MOUTH DAILY AS NEEDED FOR ANXIETY 30 tablet 0  . clonazePAM (KLONOPIN) 0.5 MG tablet One tablet once daily ,as needed, for anxiety 30 tablet 3  . cycloSPORINE (RESTASIS) 0.05 % ophthalmic emulsion Place 1 drop into both eyes 2 (two) times daily.    . diphenhydrAMINE (BENADRYL) 50 MG tablet Take 50 mg (1 tab) one hour prior to scan (10 am on 8/31) 1 tablet 0  . diphenhydrAMINE (BENADRYL) 50 MG tablet Take this medicine with you to the scan. The x-ray tech will tell you when to take it. 1 tablet 0  . diphenoxylate-atropine (LOMOTIL) 2.5-0.025 MG tablet Take 1 tablet by mouth 4 (four) times daily as needed for diarrhea or loose stools. 60 tablet 1  . ferrous sulfate 325 (65 FE) MG tablet Take 325 mg by mouth daily with breakfast.    . Gemcitabine HCl (GEMZAR IV) Inject into the vein. Day 1,day 1 every 28 days    . hydrochlorothiazide (MICROZIDE) 12.5 MG capsule     . lidocaine-prilocaine (EMLA) cream APPLY A QUARTER SIZE AMOUNT TO PORT SITE 1 HOURS PRIOR TO CHEMO DO NOT RUB IN COVER WITH PLASTIC WRAP 30 g 0  . meclizine (ANTIVERT) 12.5 MG tablet Take 1 tablet (12.5 mg total) by mouth 3 (three) times daily as needed for  dizziness. 30 tablet 0  . metoprolol tartrate (LOPRESSOR) 25 MG tablet Take 1 tablet (25 mg total) by mouth 2 (two) times daily. 180 tablet 3  . mirtazapine (REMERON) 7.5 MG tablet Take 1 tablet (7.5 mg total) by mouth at bedtime. 30 tablet 2  . Multiple Vitamins-Minerals (EQ ONE DAILY WOMENS 50+) TABS Take 1 tablet by mouth daily.    . potassium chloride (K-DUR) 10 MEQ tablet Take 1 tablet (10 mEq total) by mouth 2 (two) times daily. 60 tablet 4  . predniSONE (DELTASONE) 50 MG tablet Take 1 tab 13 hrs prior to scan, take 1 tab 7 hrs prior to scan, 1 tab with you to scan. They will tell you when to take it 3 tablet 0  . traMADol (ULTRAM) 50 MG tablet One tablet once daily as needed for uncontrolled  pain 30 tablet 1  . UNABLE TO FIND Take by mouth 3 (three) times daily. Med Name: Curamin    . erlotinib (TARCEVA) 100 MG tablet Take 1 tablet (100 mg total) by mouth daily. Take on an empty stomach 1 hour before meals or 2 hours after 30 tablet 1  . pantoprazole (PROTONIX) 20 MG tablet TAKE 1 TABLET(20 MG) BY MOUTH DAILY 90 tablet 0   No current facility-administered medications for this visit.    Facility-Administered Medications Ordered in Other Visits  Medication Dose Route Frequency Provider Last Rate Last Dose  . dexamethasone (DECADRON) 10 mg in sodium chloride 0.9 % 50 mL IVPB   Intravenous Once Pen[place], Kelby Fam, MD  Review of Systems: Review of Systems  All other systems reviewed and are negative.    PHYSICAL EXAMINATION There were no vitals taken for this visit.  ECOG PERFORMANCE STATUS: 1 - Symptomatic but completely ambulatory  Physical Exam  Constitutional: She is oriented to person, place, and time and well-developed, well-nourished, and in no distress. No distress.  HENT:  Head: Normocephalic and atraumatic.  Mouth/Throat: Oropharynx is clear and moist.  Eyes: Conjunctivae and EOM are normal. Pupils are equal, round, and reactive to light. No scleral icterus.   Neck: No thyromegaly present.  Cardiovascular: Normal rate and regular rhythm.  No murmur heard. Pulmonary/Chest: Effort normal and breath sounds normal. No respiratory distress. She has no wheezes. She has no rales.  Abdominal: Soft. Bowel sounds are normal. She exhibits no distension and no mass. There is no tenderness. There is no guarding.  Musculoskeletal: She exhibits no edema.  Lymphadenopathy:    She has no cervical adenopathy.  Neurological: She is alert and oriented to person, place, and time. She has normal reflexes. No cranial nerve deficit.  Skin: Skin is warm and dry. No rash noted. She is not diaphoretic. No erythema.     LABORATORY DATA: I have personally reviewed the data as listed: Infusion on 05/20/2017  Component Date Value Ref Range Status  . Sodium 05/20/2017 139  135 - 145 mmol/L Final  . Potassium 05/20/2017 3.7  3.5 - 5.1 mmol/L Final  . Chloride 05/20/2017 104  101 - 111 mmol/L Final  . CO2 05/20/2017 26  22 - 32 mmol/L Final  . Glucose, Bld 05/20/2017 104* 65 - 99 mg/dL Final  . BUN 05/20/2017 22* 6 - 20 mg/dL Final  . Creatinine, Ser 05/20/2017 0.99  0.44 - 1.00 mg/dL Final  . Calcium 05/20/2017 8.5* 8.9 - 10.3 mg/dL Final  . Total Protein 05/20/2017 6.7  6.5 - 8.1 g/dL Final  . Albumin 05/20/2017 3.3* 3.5 - 5.0 g/dL Final  . AST 05/20/2017 28  15 - 41 U/L Final  . ALT 05/20/2017 18  14 - 54 U/L Final  . Alkaline Phosphatase 05/20/2017 167* 38 - 126 U/L Final  . Total Bilirubin 05/20/2017 0.5  0.3 - 1.2 mg/dL Final  . GFR calc non Af Amer 05/20/2017 50* >60 mL/min Final  . GFR calc Af Amer 05/20/2017 58* >60 mL/min Final   Comment: (NOTE) The eGFR has been calculated using the CKD EPI equation. This calculation has not been validated in all clinical situations. eGFR's persistently <60 mL/min signify possible Chronic Kidney Disease.   . Anion gap 05/20/2017 9  5 - 15 Final  . WBC 05/20/2017 4.4  4.0 - 10.5 K/uL Final  . RBC 05/20/2017 3.28* 3.87 -  5.11 MIL/uL Final  . Hemoglobin 05/20/2017 9.5* 12.0 - 15.0 g/dL Final  . HCT 05/20/2017 29.6* 36.0 - 46.0 % Final  . MCV 05/20/2017 90.2  78.0 - 100.0 fL Final  . MCH 05/20/2017 29.0  26.0 - 34.0 pg Final  . MCHC 05/20/2017 32.1  30.0 - 36.0 g/dL Final  . RDW 05/20/2017 17.5* 11.5 - 15.5 % Final  . Platelets 05/20/2017 227  150 - 400 K/uL Final  . Neutrophils Relative % 05/20/2017 50  % Final  . Neutro Abs 05/20/2017 2.2  1.7 - 7.7 K/uL Final  . Lymphocytes Relative 05/20/2017 30  % Final  . Lymphs Abs 05/20/2017 1.3  0.7 - 4.0 K/uL Final  . Monocytes Relative 05/20/2017 15  % Final  . Monocytes Absolute 05/20/2017 0.6  0.1 - 1.0 K/uL Final  . Eosinophils Relative 05/20/2017 4  % Final  . Eosinophils Absolute 05/20/2017 0.2  0.0 - 0.7 K/uL Final  . Basophils Relative 05/20/2017 1  % Final  . Basophils Absolute 05/20/2017 0.1  0.0 - 0.1 K/uL Final  . CA 19-9 05/20/2017 98* 0 - 35 U/mL Final   Comment: (NOTE) Roche [employer]ostics Electrochemiluminescence Immunoassay (ECLIA) Values obtained with different assay methods or kits cannot be used interchangeably.  Results cannot be interpreted as absolute evidence of the presence or absence of malignant disease. Performed At: [facility] [place], [place] [id number] Rush Farmer MD NB:[id number]        Ardath Sax, MD

## 2017-06-03 ENCOUNTER — Encounter (HOSPITAL_COMMUNITY): Payer: Self-pay

## 2017-06-03 ENCOUNTER — Ambulatory Visit (HOSPITAL_COMMUNITY): Payer: Medicare HMO

## 2017-06-03 ENCOUNTER — Other Ambulatory Visit: Payer: Self-pay

## 2017-06-03 ENCOUNTER — Inpatient Hospital Stay (HOSPITAL_COMMUNITY): Payer: Medicare HMO

## 2017-06-03 VITALS — BP 157/55 | HR 107 | Temp 98.2°F | Resp 20 | Wt 126.8 lb

## 2017-06-03 DIAGNOSIS — C252 Malignant neoplasm of tail of pancreas: Secondary | ICD-10-CM | POA: Diagnosis not present

## 2017-06-03 DIAGNOSIS — Z803 Family history of malignant neoplasm of breast: Secondary | ICD-10-CM | POA: Diagnosis not present

## 2017-06-03 DIAGNOSIS — Z5111 Encounter for antineoplastic chemotherapy: Secondary | ICD-10-CM | POA: Diagnosis not present

## 2017-06-03 DIAGNOSIS — I10 Essential (primary) hypertension: Secondary | ICD-10-CM | POA: Diagnosis not present

## 2017-06-03 DIAGNOSIS — C259 Malignant neoplasm of pancreas, unspecified: Secondary | ICD-10-CM

## 2017-06-03 DIAGNOSIS — C787 Secondary malignant neoplasm of liver and intrahepatic bile duct: Principal | ICD-10-CM

## 2017-06-03 DIAGNOSIS — Z800 Family history of malignant neoplasm of digestive organs: Secondary | ICD-10-CM | POA: Diagnosis not present

## 2017-06-03 LAB — CBC WITH DIFFERENTIAL/PLATELET
BASOS PCT: 1 %
Basophils Absolute: 0 10*3/uL (ref 0.0–0.1)
EOS ABS: 0.4 10*3/uL (ref 0.0–0.7)
EOS PCT: 6 %
HCT: 31.3 % — ABNORMAL LOW (ref 36.0–46.0)
HEMOGLOBIN: 10 g/dL — AB (ref 12.0–15.0)
LYMPHS ABS: 1.6 10*3/uL (ref 0.7–4.0)
Lymphocytes Relative: 27 %
MCH: 28.9 pg (ref 26.0–34.0)
MCHC: 31.9 g/dL (ref 30.0–36.0)
MCV: 90.5 fL (ref 78.0–100.0)
MONOS PCT: 9 %
Monocytes Absolute: 0.5 10*3/uL (ref 0.1–1.0)
NEUTROS PCT: 57 %
Neutro Abs: 3.3 10*3/uL (ref 1.7–7.7)
PLATELETS: 148 10*3/uL — AB (ref 150–400)
RBC: 3.46 MIL/uL — ABNORMAL LOW (ref 3.87–5.11)
RDW: 17.9 % — AB (ref 11.5–15.5)
WBC: 5.8 10*3/uL (ref 4.0–10.5)

## 2017-06-03 LAB — COMPREHENSIVE METABOLIC PANEL
ALT: 14 U/L (ref 14–54)
ANION GAP: 10 (ref 5–15)
AST: 19 U/L (ref 15–41)
Albumin: 3.3 g/dL — ABNORMAL LOW (ref 3.5–5.0)
Alkaline Phosphatase: 152 U/L — ABNORMAL HIGH (ref 38–126)
BUN: 23 mg/dL — ABNORMAL HIGH (ref 6–20)
CALCIUM: 8.5 mg/dL — AB (ref 8.9–10.3)
CHLORIDE: 105 mmol/L (ref 101–111)
CO2: 25 mmol/L (ref 22–32)
Creatinine, Ser: 1.04 mg/dL — ABNORMAL HIGH (ref 0.44–1.00)
GFR calc Af Amer: 55 mL/min — ABNORMAL LOW (ref >60)
GFR calc non Af Amer: 47 mL/min — ABNORMAL LOW (ref >60)
Glucose, Bld: 111 mg/dL — ABNORMAL HIGH (ref 65–99)
POTASSIUM: 3.7 mmol/L (ref 3.5–5.1)
SODIUM: 140 mmol/L (ref 135–145)
Total Bilirubin: 0.8 mg/dL (ref 0.3–1.2)
Total Protein: 6.7 g/dL (ref 6.5–8.1)

## 2017-06-03 MED ORDER — PROCHLORPERAZINE MALEATE 10 MG PO TABS
10.0000 mg | ORAL_TABLET | Freq: Once | ORAL | Status: AC
  Administered 2017-06-03: 10 mg via ORAL

## 2017-06-03 MED ORDER — HEPARIN SOD (PORK) LOCK FLUSH 100 UNIT/ML IV SOLN
500.0000 [IU] | Freq: Once | INTRAVENOUS | Status: AC | PRN
  Administered 2017-06-03: 500 [IU]

## 2017-06-03 MED ORDER — SODIUM CHLORIDE 0.9 % IV SOLN
Freq: Once | INTRAVENOUS | Status: AC
  Administered 2017-06-03: 10:00:00 via INTRAVENOUS

## 2017-06-03 MED ORDER — GI COCKTAIL ~~LOC~~
30.0000 mL | Freq: Once | ORAL | Status: AC
  Administered 2017-06-03: 30 mL via ORAL
  Filled 2017-06-03: qty 30

## 2017-06-03 MED ORDER — PROCHLORPERAZINE MALEATE 10 MG PO TABS
ORAL_TABLET | ORAL | Status: AC
  Filled 2017-06-03: qty 1

## 2017-06-03 MED ORDER — SODIUM CHLORIDE 0.9 % IV SOLN
800.0000 mg/m2 | Freq: Once | INTRAVENOUS | Status: AC
  Administered 2017-06-03: 1292 mg via INTRAVENOUS
  Filled 2017-06-03: qty 10.52

## 2017-06-03 NOTE — Progress Notes (Signed)
Tolerated infusion w/o adverse reaction.  Alert, in no distress.  VSS.  Discharged ambulatory.  

## 2017-06-04 ENCOUNTER — Other Ambulatory Visit: Payer: Self-pay | Admitting: Family Medicine

## 2017-06-04 ENCOUNTER — Telehealth: Payer: Self-pay | Admitting: Family Medicine

## 2017-06-04 NOTE — Telephone Encounter (Signed)
Pt is calling states that she is having pain under her Lft Breast. The Cancer Dr and Nurse suggested that she may have gas, and to take Malax, she stated that she had not picked up the malax, but dr simpson gave her a gas pill.. I advised that she could call in the AM for an appt if she needed to be seen

## 2017-06-04 NOTE — Telephone Encounter (Signed)
I attempted to call pt twice earlier this pm and she did not answe. I tried to prescribe zantac but there is an interaction with her medication listed for treating her cancer. Protonix is listed as being prescribed earlier this year, if she alreadty has that and has taken it with no  Problem she may try taking this please let her know

## 2017-06-04 NOTE — Progress Notes (Signed)
No medication prescribed 

## 2017-06-05 NOTE — Telephone Encounter (Signed)
Symptoms not present anymore

## 2017-06-09 ENCOUNTER — Other Ambulatory Visit: Payer: Self-pay | Admitting: Family Medicine

## 2017-06-09 MED ORDER — TRAMADOL HCL 50 MG PO TABS: ORAL_TABLET | ORAL | 2 refills | Status: DC

## 2017-06-09 NOTE — Progress Notes (Signed)
Tramadol

## 2017-06-16 ENCOUNTER — Ambulatory Visit: Payer: Medicare HMO | Admitting: Family Medicine

## 2017-06-16 ENCOUNTER — Encounter: Payer: Self-pay | Admitting: Family Medicine

## 2017-06-16 VITALS — BP 158/70 | HR 62 | Resp 16 | Ht 64.0 in | Wt 126.8 lb

## 2017-06-16 DIAGNOSIS — F419 Anxiety disorder, unspecified: Secondary | ICD-10-CM | POA: Diagnosis not present

## 2017-06-16 DIAGNOSIS — C259 Malignant neoplasm of pancreas, unspecified: Secondary | ICD-10-CM

## 2017-06-16 DIAGNOSIS — F320 Major depressive disorder, single episode, mild: Secondary | ICD-10-CM | POA: Diagnosis not present

## 2017-06-16 DIAGNOSIS — H547 Unspecified visual loss: Secondary | ICD-10-CM

## 2017-06-16 DIAGNOSIS — I10 Essential (primary) hypertension: Secondary | ICD-10-CM

## 2017-06-16 DIAGNOSIS — R69 Illness, unspecified: Secondary | ICD-10-CM | POA: Diagnosis not present

## 2017-06-16 DIAGNOSIS — C787 Secondary malignant neoplasm of liver and intrahepatic bile duct: Secondary | ICD-10-CM | POA: Diagnosis not present

## 2017-06-16 DIAGNOSIS — M150 Primary generalized (osteo)arthritis: Secondary | ICD-10-CM | POA: Diagnosis not present

## 2017-06-16 NOTE — Patient Instructions (Addendum)
Physical exam in end March, call if you need me before  Please ask about flu vaccine at cancer center  Please cut back on salty foods and sausage, also eat a lot of vegetable and  White meat and fruit  From my standpoint you may travel and enjoy good family times  Please confirm travel dates with your oncologists  You will be referred to Dr Jorja Loa for eye exam  Ear exam is good    Be careful not to falll, and try to start eating during the day  Enjoy playing cards

## 2017-06-17 ENCOUNTER — Ambulatory Visit (HOSPITAL_COMMUNITY): Payer: Self-pay

## 2017-06-17 ENCOUNTER — Ambulatory Visit (HOSPITAL_COMMUNITY): Payer: Self-pay | Admitting: Internal Medicine

## 2017-06-21 ENCOUNTER — Encounter: Payer: Self-pay | Admitting: Family Medicine

## 2017-06-21 NOTE — Assessment & Plan Note (Signed)
Stable , followed closely and being treated by oncology

## 2017-06-21 NOTE — Assessment & Plan Note (Signed)
Fall risk reduction and home safety discussed Continue tramadol AT  Current dose

## 2017-06-21 NOTE — Assessment & Plan Note (Signed)
Controlled, no change in medication  

## 2017-06-21 NOTE — Progress Notes (Signed)
   [name]     MRN: [id number]      DOB: 1929-03-30   HPI [name] is here for follow up and re-evaluation of chronic medical conditions, medication management and review of any available recent lab and radiology data.  Preventive health is updated, specifically  Cancer screening and Immunization.   Questions or concerns regarding consultations or procedures which the PT has had in the interim are  addressed. The PT denies any adverse reactions to current medications since the last visit.  C/o bilateral leg pain adequately controled with tramadol C/o vision loss, requests referral for eye exam C/o  abnormal sound in right ear   ROS Denies recent fever or chills. Denies sinus pressure, nasal congestion,  sore throat. Denies chest congestion, productive cough or wheezing. Denies chest pains, palpitations and leg swelling Denies abdominal pain, nausea, vomiting,diarrhea or constipation.   Denies dysuria, frequency, hesitancy or incontinence.  Denies headaches, seizures, numbness, or tingling. Denies uncontrolled  depression, anxiety or insomnia. Denies skin break down or rash.   PE  BP (!) 158/70   Pulse 62   Resp 16   Ht 5\' 4"  (1.626 m)   Wt 126 lb 12.8 oz (57.5 kg)   SpO2 94%   BMI 21.77 kg/m   Patient alert and oriented and in no cardiopulmonary distress.  HEENT: No facial asymmetry, EOMI,   oropharynx pink and moist.  Neck supple no JVD, no mass.TM clear bilaterally  Chest: Clear to auscultation bilaterally.  CVS: S1, S2 no murmurs, no S3.Regular rate.  ABD: Soft non tender.   Ext: No edema  MS: Decreased  ROM spine, shoulders, hips and knees.  Skin: Intact, no ulcerations or rash noted.  Psych: Good eye contact, normal affect. Memory intact mildly  anxious not  depressed appearing.  CNS: CN 2-12 intact, power,  normal throughout.no focal deficits noted.   Assessment & Plan  Essential hypertension Elevated , not at goal, needs to reduce salty  foods and increasr fruit and vegetables. No medication change  Primary generalized (osteo)arthritis Fall risk reduction and home safety discussed Continue tramadol AT  Current dose  Depression (emotion) Controlled, no change in medication   Anxiety Controlled, no change in medication   Pancreatic cancer metastasized to liver (HCC) Stable , followed closely and being treated by oncology

## 2017-06-21 NOTE — Assessment & Plan Note (Signed)
Elevated , not at goal, needs to reduce salty foods and increasr fruit and vegetables. No medication change

## 2017-06-24 ENCOUNTER — Encounter (HOSPITAL_COMMUNITY): Payer: Self-pay | Admitting: Internal Medicine

## 2017-06-24 ENCOUNTER — Ambulatory Visit (HOSPITAL_COMMUNITY): Payer: Self-pay

## 2017-06-24 ENCOUNTER — Inpatient Hospital Stay (HOSPITAL_COMMUNITY): Payer: Medicare HMO

## 2017-06-24 ENCOUNTER — Inpatient Hospital Stay (HOSPITAL_COMMUNITY): Payer: Medicare HMO | Attending: Hematology and Oncology | Admitting: Internal Medicine

## 2017-06-24 ENCOUNTER — Encounter (HOSPITAL_COMMUNITY): Payer: Self-pay

## 2017-06-24 ENCOUNTER — Other Ambulatory Visit: Payer: Self-pay

## 2017-06-24 VITALS — BP 180/53 | HR 54 | Temp 97.6°F | Resp 18 | Wt 127.6 lb

## 2017-06-24 DIAGNOSIS — C259 Malignant neoplasm of pancreas, unspecified: Secondary | ICD-10-CM

## 2017-06-24 DIAGNOSIS — I10 Essential (primary) hypertension: Secondary | ICD-10-CM | POA: Diagnosis not present

## 2017-06-24 DIAGNOSIS — C252 Malignant neoplasm of tail of pancreas: Secondary | ICD-10-CM | POA: Insufficient documentation

## 2017-06-24 DIAGNOSIS — C787 Secondary malignant neoplasm of liver and intrahepatic bile duct: Secondary | ICD-10-CM

## 2017-06-24 DIAGNOSIS — Z5111 Encounter for antineoplastic chemotherapy: Secondary | ICD-10-CM | POA: Diagnosis present

## 2017-06-24 LAB — COMPREHENSIVE METABOLIC PANEL
ALBUMIN: 3.3 g/dL — AB (ref 3.5–5.0)
ALK PHOS: 194 U/L — AB (ref 38–126)
ALT: 74 U/L — ABNORMAL HIGH (ref 14–54)
ANION GAP: 10 (ref 5–15)
AST: 79 U/L — ABNORMAL HIGH (ref 15–41)
BILIRUBIN TOTAL: 0.9 mg/dL (ref 0.3–1.2)
BUN: 24 mg/dL — ABNORMAL HIGH (ref 6–20)
CALCIUM: 8.5 mg/dL — AB (ref 8.9–10.3)
CO2: 24 mmol/L (ref 22–32)
Chloride: 105 mmol/L (ref 101–111)
Creatinine, Ser: 0.99 mg/dL (ref 0.44–1.00)
GFR calc Af Amer: 58 mL/min — ABNORMAL LOW (ref >60)
GFR, EST NON AFRICAN AMERICAN: 50 mL/min — AB (ref >60)
Glucose, Bld: 131 mg/dL — ABNORMAL HIGH (ref 65–99)
Potassium: 3.5 mmol/L (ref 3.5–5.1)
Sodium: 139 mmol/L (ref 135–145)
TOTAL PROTEIN: 6.7 g/dL (ref 6.5–8.1)

## 2017-06-24 LAB — CBC WITH DIFFERENTIAL/PLATELET
BASOS ABS: 0 10*3/uL (ref 0.0–0.1)
BASOS PCT: 1 %
EOS PCT: 6 %
Eosinophils Absolute: 0.3 10*3/uL (ref 0.0–0.7)
HCT: 31.5 % — ABNORMAL LOW (ref 36.0–46.0)
Hemoglobin: 10.2 g/dL — ABNORMAL LOW (ref 12.0–15.0)
Lymphocytes Relative: 22 %
Lymphs Abs: 1.2 10*3/uL (ref 0.7–4.0)
MCH: 28.9 pg (ref 26.0–34.0)
MCHC: 32.4 g/dL (ref 30.0–36.0)
MCV: 89.2 fL (ref 78.0–100.0)
MONO ABS: 0.6 10*3/uL (ref 0.1–1.0)
Monocytes Relative: 11 %
NEUTROS ABS: 3.1 10*3/uL (ref 1.7–7.7)
Neutrophils Relative %: 60 %
PLATELETS: 205 10*3/uL (ref 150–400)
RBC: 3.53 MIL/uL — AB (ref 3.87–5.11)
RDW: 17.7 % — AB (ref 11.5–15.5)
WBC: 5.2 10*3/uL (ref 4.0–10.5)

## 2017-06-24 LAB — LACTATE DEHYDROGENASE: LDH: 178 U/L (ref 98–192)

## 2017-06-24 MED ORDER — PROCHLORPERAZINE MALEATE 10 MG PO TABS
ORAL_TABLET | ORAL | Status: AC
  Filled 2017-06-24: qty 1

## 2017-06-24 MED ORDER — SODIUM CHLORIDE 0.9 % IV SOLN
Freq: Once | INTRAVENOUS | Status: AC
  Administered 2017-06-24: 11:00:00 via INTRAVENOUS

## 2017-06-24 MED ORDER — HEPARIN SOD (PORK) LOCK FLUSH 100 UNIT/ML IV SOLN
500.0000 [IU] | Freq: Once | INTRAVENOUS | Status: AC | PRN
  Administered 2017-06-24: 500 [IU]
  Filled 2017-06-24: qty 5

## 2017-06-24 MED ORDER — SODIUM CHLORIDE 0.9 % IV SOLN
800.0000 mg/m2 | Freq: Once | INTRAVENOUS | Status: AC
  Administered 2017-06-24: 1292 mg via INTRAVENOUS
  Filled 2017-06-24: qty 10.52

## 2017-06-24 MED ORDER — PROCHLORPERAZINE MALEATE 10 MG PO TABS
10.0000 mg | ORAL_TABLET | Freq: Once | ORAL | Status: AC
  Administered 2017-06-24: 10 mg via ORAL

## 2017-06-24 NOTE — Progress Notes (Signed)
Patient is taking Tarceva and has not missed any doses and reports no side effects at this time.

## 2017-06-24 NOTE — Progress Notes (Signed)
Labs reviewed and okay to treat per Dr. Walden Field.

## 2017-06-24 NOTE — Patient Instructions (Addendum)
[place] at [facility] Discharge Instructions  RECOMMENDATIONS MADE BY THE CONSULTANT AND ANY TEST RESULTS WILL BE SENT TO YOUR REFERRING PHYSICIAN.  You were seen today by Dr. Zoila Shutter  Treatment today and return next week for treatment/labs  CT scans due in March  Return after CT to review results with MD    Thank you for choosing [place] at [facility] to provide your oncology and hematology care.  To afford each patient quality time with our provider, please arrive at least 15 minutes before your scheduled appointment time.    If you have a lab appointment with the [facility] please come in thru the  Main Entrance and check in at the main information desk  You need to re-schedule your appointment should you arrive 10 or more minutes late.  We strive to give you quality time with our providers, and arriving late affects you and other patients whose appointments are after yours.  Also, if you no show three or more times for appointments you may be dismissed from the clinic at the providers discretion.     Again, thank you for choosing [facility].  Our hope is that these requests will decrease the amount of time that you wait before being seen by our physicians.       _____________________________________________________________  Should you have questions after your visit to [facility], please contact our office at [phone] between the hours of 8:30 a.m. and 4:30 p.m.  Voicemails left after 4:30 p.m. will not be returned until the following business day.  For prescription refill requests, have your pharmacy contact our office.       Resources For Cancer Patients and their Caregivers ? American Cancer Society: Can assist with transportation, wigs, general needs, runs Look Good Feel Better.        (279)679-3203 ? Cancer Care: Provides financial assistance, online support groups,  medication/co-pay assistance.  1-800-813-HOPE 315 243 4696) ? [facility] Assists [place] cancer patients and their families through emotional , educational and financial support.  669 085 8684 ? [place] DSS Where to apply for food stamps, Medicaid and utility assistance. 587-786-6957 ? [employer]: Transportation to medical appointments. (314) 475-7762 ? Social Security Administration: May apply for disability if have a Stage IV cancer. 910 004 4431 4372815978 ? [employer], Disability and Transit Services: Assists with nutrition, care and transit needs. [place] Support Programs: @10RELATIVEDAYS @ > Cancer Support Group  2nd Tuesday of the month 1pm-2pm, Journey Room  > Creative Journey  3rd Tuesday of the month 1130am-1pm, Journey Room  > Look Good Feel Better  1st Wednesday of the month 10am-12 noon, Journey Room (Call [facility] to register [phone])

## 2017-06-24 NOTE — Progress Notes (Signed)
Tolerated infusion w/o adverse reaction.  Alert, in no distress.  VSS.  Discharged ambulatory.  

## 2017-06-25 LAB — CANCER ANTIGEN 19-9: CA 19-9: 129 U/mL — ABNORMAL HIGH (ref 0–35)

## 2017-07-01 ENCOUNTER — Inpatient Hospital Stay (HOSPITAL_COMMUNITY): Payer: Medicare HMO

## 2017-07-01 ENCOUNTER — Other Ambulatory Visit: Payer: Self-pay

## 2017-07-01 ENCOUNTER — Encounter (HOSPITAL_COMMUNITY): Payer: Self-pay

## 2017-07-01 DIAGNOSIS — C259 Malignant neoplasm of pancreas, unspecified: Secondary | ICD-10-CM

## 2017-07-01 DIAGNOSIS — Z5111 Encounter for antineoplastic chemotherapy: Secondary | ICD-10-CM | POA: Diagnosis not present

## 2017-07-01 DIAGNOSIS — C787 Secondary malignant neoplasm of liver and intrahepatic bile duct: Secondary | ICD-10-CM | POA: Diagnosis not present

## 2017-07-01 DIAGNOSIS — C252 Malignant neoplasm of tail of pancreas: Secondary | ICD-10-CM | POA: Diagnosis not present

## 2017-07-01 LAB — CBC WITH DIFFERENTIAL/PLATELET
BASOS ABS: 0 10*3/uL (ref 0.0–0.1)
Basophils Relative: 1 %
EOS ABS: 0.1 10*3/uL (ref 0.0–0.7)
EOS PCT: 3 %
HCT: 29.9 % — ABNORMAL LOW (ref 36.0–46.0)
HEMOGLOBIN: 9.6 g/dL — AB (ref 12.0–15.0)
Lymphocytes Relative: 55 %
Lymphs Abs: 1.1 10*3/uL (ref 0.7–4.0)
MCH: 28.7 pg (ref 26.0–34.0)
MCHC: 32.1 g/dL (ref 30.0–36.0)
MCV: 89.3 fL (ref 78.0–100.0)
Monocytes Absolute: 0.2 10*3/uL (ref 0.1–1.0)
Monocytes Relative: 10 %
NEUTROS PCT: 31 %
Neutro Abs: 0.6 10*3/uL — ABNORMAL LOW (ref 1.7–7.7)
PLATELETS: 90 10*3/uL — AB (ref 150–400)
RBC: 3.35 MIL/uL — AB (ref 3.87–5.11)
RDW: 16.8 % — ABNORMAL HIGH (ref 11.5–15.5)
WBC: 2 10*3/uL — AB (ref 4.0–10.5)

## 2017-07-01 LAB — COMPREHENSIVE METABOLIC PANEL
ALT: 35 U/L (ref 14–54)
AST: 36 U/L (ref 15–41)
Albumin: 3.1 g/dL — ABNORMAL LOW (ref 3.5–5.0)
Alkaline Phosphatase: 227 U/L — ABNORMAL HIGH (ref 38–126)
Anion gap: 11 (ref 5–15)
BILIRUBIN TOTAL: 0.6 mg/dL (ref 0.3–1.2)
BUN: 22 mg/dL — AB (ref 6–20)
CO2: 24 mmol/L (ref 22–32)
CREATININE: 1 mg/dL (ref 0.44–1.00)
Calcium: 8.5 mg/dL — ABNORMAL LOW (ref 8.9–10.3)
Chloride: 104 mmol/L (ref 101–111)
GFR, EST AFRICAN AMERICAN: 57 mL/min — AB (ref >60)
GFR, EST NON AFRICAN AMERICAN: 50 mL/min — AB (ref >60)
Glucose, Bld: 87 mg/dL (ref 65–99)
POTASSIUM: 3.7 mmol/L (ref 3.5–5.1)
Sodium: 139 mmol/L (ref 135–145)
TOTAL PROTEIN: 6.7 g/dL (ref 6.5–8.1)

## 2017-07-01 LAB — LACTATE DEHYDROGENASE: LDH: 158 U/L (ref 98–192)

## 2017-07-01 MED ORDER — HEPARIN SOD (PORK) LOCK FLUSH 100 UNIT/ML IV SOLN
500.0000 [IU] | Freq: Once | INTRAVENOUS | Status: AC
  Administered 2017-07-01: 500 [IU] via INTRAVENOUS

## 2017-07-01 MED ORDER — SODIUM CHLORIDE 0.9% FLUSH
10.0000 mL | INTRAVENOUS | Status: DC | PRN
  Administered 2017-07-01: 10 mL
  Filled 2017-07-01: qty 10

## 2017-07-01 NOTE — Progress Notes (Signed)
Labs reviewed with Mike Craze NP. Will hold treatment today due to platelets at 90,000 and absolute neutrophils are 0.6.   Patient is takingTarceva and has not missed any doses and reports no side effects at this time.   Vitals stable and discharged home from clinic ambulatory. Follow up as scheduled.

## 2017-07-01 NOTE — Patient Instructions (Signed)
[place] at [facility] Discharge Instructions  RECOMMENDATIONS MADE BY THE CONSULTANT AND ANY TEST RESULTS WILL BE SENT TO YOUR REFERRING PHYSICIAN.  Chemo held today. Follow up as scheduled.  Thank you for choosing [place] at [facility] to provide your oncology and hematology care.  To afford each patient quality time with our provider, please arrive at least 15 minutes before your scheduled appointment time.    If you have a lab appointment with the [facility] please come in thru the  Main Entrance and check in at the main information desk  You need to re-schedule your appointment should you arrive 10 or more minutes late.  We strive to give you quality time with our providers, and arriving late affects you and other patients whose appointments are after yours.  Also, if you no show three or more times for appointments you may be dismissed from the clinic at the providers discretion.     Again, thank you for choosing [facility].  Our hope is that these requests will decrease the amount of time that you wait before being seen by our physicians.       _____________________________________________________________  Should you have questions after your visit to [facility], please contact our office at [phone] between the hours of 8:30 a.m. and 4:30 p.m.  Voicemails left after 4:30 p.m. will not be returned until the following business day.  For prescription refill requests, have your pharmacy contact our office.       Resources For Cancer Patients and their Caregivers ? American Cancer Society: Can assist with transportation, wigs, general needs, runs Look Good Feel Better.        7164214695 ? Cancer Care: Provides financial assistance, online support groups, medication/co-pay assistance.  1-800-813-HOPE (970)087-2042) ? [facility] Assists [place] cancer patients and their  families through emotional , educational and financial support.  (289)773-0560 ? [place] DSS Where to apply for food stamps, Medicaid and utility assistance. 319-057-1028 ? [employer]: Transportation to medical appointments. 351-691-5678 ? Social Security Administration: May apply for disability if have a Stage IV cancer. 9404839077 252-880-8752 ? [employer], Disability and Transit Services: Assists with nutrition, care and transit needs. [place] Support Programs: @10RELATIVEDAYS @ > Cancer Support Group  2nd Tuesday of the month 1pm-2pm, Journey Room  > Creative Journey  3rd Tuesday of the month 1130am-1pm, Journey Room  > Look Good Feel Better  1st Wednesday of the month 10am-12 noon, Journey Room (Call [place] to register [phone])

## 2017-07-01 NOTE — Progress Notes (Signed)
Diagnosis Pancreatic cancer metastasized to liver [facility]) - Plan: CBC with Differential/Platelet, Comprehensive metabolic panel, Lactate dehydrogenase, CT Chest W Contrast, CT Abdomen Pelvis W Contrast, DISCONTINUED: 0.9 %  sodium chloride infusion, DISCONTINUED: heparin lock flush 100 unit/mL, DISCONTINUED: prochlorperazine (COMPAZINE) tablet 10 mg, DISCONTINUED: gemcitabine (GEMZAR) 1,292 mg in sodium chloride 0.9 % 100 mL chemo infusion  Staging Cancer Staging Pancreatic cancer metastasized to liver [facility]) Staging form: Pancreas, AJCC 7th Edition - Clinical: Stage IV (T3, N1, M1) - Signed by Baird Cancer, PA-C on 03/16/2014 - Pathologic: No stage assigned - Unsigned   Assessment and Planancreatic cancer metastasized to liver ([place]) 1.  84 y.o.  female with diagnosis of stage IV pancreatic adenocarcinoma metastatic to the liver.  Pt on gemcitabine and erlotinib. Labs adequate for therapy.  She will RTC to clinic next week for D8.  She will be set up for CT scans in 07/2017 and will RTC to go over results.    2.  Hypertension:  BP 180/53.  Followup with PCP.    3.  Abnormal LFTs.  Bilirubin WNL.  Likely due to known liver mets. Pt is set up for repeat imaging in 07/2017.  Interval History:  Pt with metastatic Pancreatic cancer.  Last scan 04/2017.  Current status:  Pt reports she is doing well and tolerating therapy.       Pancreatic cancer metastasized to liver ([place])   03/10/2014 Initial Diagnosis    Pancreatic cancer metastasized to liver      03/22/2014 - 03/05/2016 Chemotherapy    Abraxane/Gemzar days 1, 8, every 28 days.  Day 15 was cancelled due to leukopenia and thrombocytopenia on day 15 cycle 1.      05/24/2014 Treatment Plan Change    Day 8 of cycle 3 is held with ANC of 1.1      06/05/2014 Imaging    CT C/A/P . Interval decrease in size of the pancreatic tail mass.Improved hepatic metastatic disease. No new lesions. No CT findings for metastatic disease involving the  chest.      08/09/2014 Tumor Marker    CA 19-9= 33 (WNL)      10/26/2014 Imaging    MRI- Continued interval decrease in size of the hepatic metastatic lesions and no new lesions are identified. Continued decrease in size of the pancreatic tail lesion.      01/03/2015 Tumor Marker    Results for [name], [name] (MRN [id number]) as of 01/18/2015 12:52  01/03/2015 10:00 CA 19-9: 14       01/17/2015 Imaging    MRI- Response to therapy of hepatic metastasis.  Similar size of a pancreatic tail lesion.      03/20/2015 Imaging    MRI-L spine- Severe disc space narrowing at L2-L3, with endplate reactive changes. Large disc extrusion into the ventral epidural space,central to the RIGHT with a cephalad migrated free fragment. Additional Large disc extrusion into the retroperitone...      03/22/2015 Imaging    CT pelvis- No evidence for metastatic disease within the pelvis.      07/24/2015 Imaging    MRI abd- Continued response to therapy, with no residual detectable liver metastases. No new sites of metastatic disease in the abdomen.       08/15/2015 Code Status     She confirms desire for DNR status.      12/21/2015 Imaging    MRI liver- Severe image degradation due to motion artifact reducing diagnostic sensitivity and specificity. Reduce conspicuity of the pancreatic tail lesion suggesting  further improvement. The original liver lesions have resolved.      03/05/2016 Treatment Plan Change    Chemotherapy holiday/Break after 2 years worth of treatment      04/07/2016 Imaging    CT chest- When compared to recent chest CT, new minimally displaced anterior left sixth rib fracture. Slight increase in subcarinal adenopathy      04/28/2016 Imaging    Bone density- BMD as determined from Femur Neck Right is 0.705 g/cm2 with a T-Score of -2.4. This patient is considered osteopenic according to [facility]) criteria. Compared with the prior study on 02/23/2013, the BMD of the  lumbar spine/rt. femoral neck show a statistically significant decrease.       05/23/2016 Imaging    MRI abd- 1. Exam is significantly degraded by patient respiratory motion. Consider follow-up exams with CT abdomen with without contrast per pancreatic protocol. 2. Fullness in tail of pancreas is more prominent and potentially increased in size. Recommend close attention on follow-up. (Consider CT as above) 3. No explanation for back pain.      09/11/2016 Imaging    MRI pancreas- Interval progression of pancreatic tail lesion. New differential perfusion right liver with areas of heterogeneity. Underlying metastatic disease in this region not excluded.      09/11/2016 Progression    MRi in conjunction with rising CA 19-9 are indicative of relapse of disease.      01/09/2017 Progression    CT C/A/P: 2.1 x 2.9 cm lesion in the pancreatic tail and abutting the splenic hilum, corresponding to known primary pancreatic neoplasm, mildly increased.  Scattered small hypoenhancing lesions in the liver measuring up to 10 mm, approximately 8-10 in number, suspicious for hepatic metastases.  No findings specific for metastatic disease in the chest.  Additional ancillary findings as above.        Problem List Patient Active Problem List   Diagnosis Date Noted  . Thrombocytopenia ([place]) [D69.6] 01/10/2017  . Neuropathy due to chemotherapeutic drug ([place]) [G62.0, T45.1X5A] 01/10/2017  . Depression (emotion) [F32.9] 12/27/2016  . At high risk for falls [Z91.81] 09/22/2015  . Anemia due to antineoplastic chemotherapy [D64.81, T45.1X5A] 09/12/2015  . DNR (do not resuscitate) [Z66] 08/16/2015  . Chronic low back pain with sciatica [M54.40, G89.29] 06/03/2015  . Hx of adenomatous colonic polyps [Z86.010] 04/24/2015  . Right knee pain [M25.561] 04/14/2015  . Allergic rhinitis [J30.9] 12/12/2014  . Pancreatic cancer metastasized to liver ([place]) [C25.9, C78.7] 03/10/2014  . Abnormal liver  ultrasound [R93.2] 02/27/2014  . Anemia, iron deficiency [D50.9] 11/03/2013  . IGT (impaired glucose tolerance) [R73.02] 02/19/2013  . Primary generalized (osteo)arthritis [M15.0] 05/12/2012  . Anxiety [F41.9] 09/15/2011  . GERD (gastroesophageal reflux disease) [K21.9] 08/20/2010  . Essential hypertension [I10] 01/07/2010    Past Medical History Past Medical History:  Diagnosis Date  . Anemia due to antineoplastic chemotherapy 09/12/2015   Started Aranesp 500 mcg on 09/12/2015  . Anxiety   . Chronic diarrhea   . Depression   . DNR (do not resuscitate) 08/16/2015  . Erosive esophagitis   . GERD (gastroesophageal reflux disease)   . Hx of adenomatous colonic polyps    tubular adenomas, last found in 2008  . Hyperplastic colon polyp 03/19/10   tcs by Dr. Gala Romney  . Hypertension 20 years   . Kidney stone    hx/ crushed   . Opioid contract exists 04/18/2015   With Dr. Moshe Cipro  . Pancreatic cancer ([place]) 02/2014  . Pancreatic cancer metastasized to liver (  [place]) 03/10/2014  . Schatzki's ring 08/26/10   Last dilated on EGD by Dr. Trevor Iha [facility], linear gastric erosions, BI hemigastrectomy    Past Surgical History Past Surgical History:  Procedure Laterality Date  . BREAST LUMPECTOMY Left   . bunion removal     from both feet   . CHOLECYSTECTOMY  1965   . COLONOSCOPY  03/19/2010   DR Gala Romney,, normal TI, pancolonic diverticula, random colon bx neg., hyperplastic polyps removed  . ESOPHAGOGASTRODUODENOSCOPY  11/22/2003   DR Gala Romney, erosive RE, Billroth I  . ESOPHAGOGASTRODUODENOSCOPY  08/26/10   Dr. Gala Romney- moderate severe ERE, Scahtzki ring s/p dilation, Billroth I, linear gastric erosions, bx-gastric xanthelasma  . LEFT SHOULDER SURGERY  2009   DR HARRISON  . ORIF ANKLE FRACTURE Right 05/22/2013   Procedure: OPEN REDUCTION INTERNAL FIXATION (ORIF) RIGHT ANKLE FRACTURE;  Surgeon: Sanjuana Kava, MD;  Location: [facility];  Service: Orthopedics;  Laterality: Right;  . stomach ulcer  50 years ago     had some of her stomach removed     Family History Family History  Problem Relation Age of Onset  . Hypertension Mother   . Kidney failure Brother        on dialysis  . Diabetes Sister   . Diabetes Sister   . Hypertension Father   . Kidney failure Sister   . Kidney Stones Brother   . Breast cancer Son   . Gout Son   . Liver disease Neg Hx   . Colon cancer Neg Hx      Social History  reports that she has been smoking cigarettes.  She has a 30.00 pack-year smoking history. she has never used smokeless tobacco. She reports that she does not drink alcohol or use drugs.  Medications  Current Outpatient Medications:  .  acetaminophen (TYLENOL) 500 MG tablet, Take 500 mg by mouth every 6 (six) hours as needed for mild pain or moderate pain. , Disp: , Rfl:  .  amLODipine (NORVASC) 10 MG tablet, TAKE 1 TABLET BY MOUTH EVERY DAY, Disp: 90 tablet, Rfl: 1 .  Calcium-Phosphorus-Vitamin D [phone] MG-MG-UNIT CHEW, Chew 2 tablets by mouth daily., Disp: , Rfl:  .  clonazePAM (KLONOPIN) 0.5 MG tablet, TAKE 1 TABLET BY MOUTH DAILY AS NEEDED FOR ANXIETY, Disp: 30 tablet, Rfl: 0 .  cycloSPORINE (RESTASIS) 0.05 % ophthalmic emulsion, Place 1 drop into both eyes 2 (two) times daily., Disp: , Rfl:  .  diphenhydrAMINE (BENADRYL) 50 MG tablet, Take 50 mg (1 tab) one hour prior to scan (10 am on 8/31), Disp: 1 tablet, Rfl: 0 .  erlotinib (TARCEVA) 100 MG tablet, Take 1 tablet (100 mg total) by mouth daily. Take on an empty stomach 1 hour before meals or 2 hours after, Disp: 30 tablet, Rfl: 1 .  ferrous sulfate 325 (65 FE) MG tablet, Take 325 mg by mouth daily with breakfast., Disp: , Rfl:  .  Gemcitabine HCl (GEMZAR IV), Inject into the vein. Day 1,day 1 every 28 days, Disp: , Rfl:  .  lidocaine-prilocaine (EMLA) cream, APPLY A QUARTER SIZE AMOUNT TO PORT SITE 1 HOURS PRIOR TO CHEMO DO NOT RUB IN COVER WITH PLASTIC WRAP, Disp: 30 g, Rfl: 0 .  meclizine (ANTIVERT) 12.5 MG tablet, Take 1 tablet (12.5  mg total) by mouth 3 (three) times daily as needed for dizziness., Disp: 30 tablet, Rfl: 0 .  metoprolol tartrate (LOPRESSOR) 25 MG tablet, Take 1 tablet (25 mg total) by mouth 2 (two) times daily., Disp: 180  tablet, Rfl: 3 .  mirtazapine (REMERON) 7.5 MG tablet, Take 1 tablet (7.5 mg total) by mouth at bedtime., Disp: 30 tablet, Rfl: 2 .  Multiple Vitamins-Minerals (EQ ONE DAILY WOMENS 50+) TABS, Take 1 tablet by mouth daily., Disp: , Rfl:  .  pantoprazole (PROTONIX) 20 MG tablet, TAKE 1 TABLET(20 MG) BY MOUTH DAILY, Disp: , Rfl:  .  potassium chloride (K-DUR) 10 MEQ tablet, Take 1 tablet (10 mEq total) by mouth 2 (two) times daily., Disp: 60 tablet, Rfl: 4 .  predniSONE (DELTASONE) 50 MG tablet, Take 1 tab 13 hrs prior to scan, take 1 tab 7 hrs prior to scan, 1 tab with you to scan. They will tell you when to take it, Disp: 3 tablet, Rfl: 0 .  traMADol (ULTRAM) 50 MG tablet, One tablet once daily, as needed, for uncontrolled pain, Disp: 30 tablet, Rfl: 2 .  UNABLE TO FIND, Take by mouth 3 (three) times daily. Med Name: Curamin, Disp: , Rfl:  No current facility-administered medications for this visit.   Facility-Administered Medications Ordered in Other Visits:  .  dexamethasone (DECADRON) 10 mg in sodium chloride 0.9 % 50 mL IVPB, , Intravenous, Once, [place], Kelby Fam, MD  Allergies Iohexol; Aciphex [rabeprazole sodium]; Amlodipine besylate-valsartan; Esomeprazole magnesium; Omeprazole; Ciprofloxacin; and Penicillins  Review of Systems Review of Systems - Oncology ROS as per HPI otherwise 12 point ROS is negative.   Physical Exam  Vitals Wt Readings from Last 3 Encounters:  06/24/17 127 lb 9.6 oz (57.9 kg)  06/24/17 127 lb 9.6 oz (57.9 kg)  06/16/17 126 lb 12.8 oz (57.5 kg)   Temp Readings from Last 3 Encounters:  06/24/17 97.6 F (36.4 C) (Oral)  06/24/17 97.6 F (36.4 C) (Oral)  06/03/17 98.2 F (36.8 C) (Oral)   BP Readings from Last 3 Encounters:  06/24/17 (!) 180/53   06/24/17 (!) 180/53  06/16/17 (!) 158/70   Pulse Readings from Last 3 Encounters:  06/24/17 (!) 54  06/24/17 (!) 54  06/16/17 62   Constitutional: Well-developed, well-nourished, and in no distress.   HENT:  Head: Normocephalic and atraumatic.  Mouth/Throat: No oropharyngeal exudate. Mucosa moist. Eyes: Pupils are equal, round, and reactive to light. Conjunctivae are normal. No scleral icterus.  Neck: Normal range of motion. Neck supple. No JVD present.  Cardiovascular: Normal rate, regular rhythm and normal heart sounds.  Exam reveals no gallop and no friction rub.   No murmur heard. Pulmonary/Chest: Effort normal and breath sounds normal. No respiratory distress. No wheezes.No rales.  Abdominal: Soft. Bowel sounds are normal. No distension. There is no tenderness. There is no guarding.  Musculoskeletal: No edema or tenderness.  Lymphadenopathy: No cervical, axillary or supraclavicular adenopathy.  Neurological: Alert and oriented to person, place, and time. No cranial nerve deficit.  Skin: Skin is warm and dry. No rash noted. No erythema. No pallor.  Psychiatric: Affect and judgment normal.   Labs Infusion on 06/24/2017  Component Date Value Ref Range Status  . Sodium 06/24/2017 139  135 - 145 mmol/L Final  . Potassium 06/24/2017 3.5  3.5 - 5.1 mmol/L Final  . Chloride 06/24/2017 105  101 - 111 mmol/L Final  . CO2 06/24/2017 24  22 - 32 mmol/L Final  . Glucose, Bld 06/24/2017 131* 65 - 99 mg/dL Final  . BUN 06/24/2017 24* 6 - 20 mg/dL Final  . Creatinine, Ser 06/24/2017 0.99  0.44 - 1.00 mg/dL Final  . Calcium 06/24/2017 8.5* 8.9 - 10.3 mg/dL Final  . Total  Protein 06/24/2017 6.7  6.5 - 8.1 g/dL Final  . Albumin 06/24/2017 3.3* 3.5 - 5.0 g/dL Final  . AST 06/24/2017 79* 15 - 41 U/L Final  . ALT 06/24/2017 74* 14 - 54 U/L Final  . Alkaline Phosphatase 06/24/2017 194* 38 - 126 U/L Final  . Total Bilirubin 06/24/2017 0.9  0.3 - 1.2 mg/dL Final  . GFR calc non Af Amer  06/24/2017 50* >60 mL/min Final  . GFR calc Af Amer 06/24/2017 58* >60 mL/min Final   Comment: (NOTE) The eGFR has been calculated using the CKD EPI equation. This calculation has not been validated in all clinical situations. eGFR's persistently <60 mL/min signify possible Chronic Kidney Disease.   Georgiann Hahn gap 06/24/2017 10  5 - 15 Final   Performed at [facility], [address], [place], [place] [address]  . WBC 06/24/2017 5.2  4.0 - 10.5 K/uL Final  . RBC 06/24/2017 3.53* 3.87 - 5.11 MIL/uL Final  . Hemoglobin 06/24/2017 10.2* 12.0 - 15.0 g/dL Final  . HCT 06/24/2017 31.5* 36.0 - 46.0 % Final  . MCV 06/24/2017 89.2  78.0 - 100.0 fL Final  . MCH 06/24/2017 28.9  26.0 - 34.0 pg Final  . MCHC 06/24/2017 32.4  30.0 - 36.0 g/dL Final  . RDW 06/24/2017 17.7* 11.5 - 15.5 % Final  . Platelets 06/24/2017 205  150 - 400 K/uL Final  . Neutrophils Relative % 06/24/2017 60  % Final  . Neutro Abs 06/24/2017 3.1  1.7 - 7.7 K/uL Final  . Lymphocytes Relative 06/24/2017 22  % Final  . Lymphs Abs 06/24/2017 1.2  0.7 - 4.0 K/uL Final  . Monocytes Relative 06/24/2017 11  % Final  . Monocytes Absolute 06/24/2017 0.6  0.1 - 1.0 K/uL Final  . Eosinophils Relative 06/24/2017 6  % Final  . Eosinophils Absolute 06/24/2017 0.3  0.0 - 0.7 K/uL Final  . Basophils Relative 06/24/2017 1  % Final  . Basophils Absolute 06/24/2017 0.0  0.0 - 0.1 K/uL Final   Performed at [facility], [address], [place], [place] [address]  . CA 19-9 06/24/2017 129* 0 - 35 U/mL Final   Comment: (NOTE) Roche Diagnostics Electrochemiluminescence Immunoassay (ECLIA) Values obtained with different assay methods or kits cannot be used interchangeably.  Results cannot be interpreted as absolute evidence of the presence or absence of malignant disease. Performed At: [facility] [place], [place] [id number] Rush Farmer MD [id number] Performed at [facility], [address], [place], [place]  [address]   . LDH 06/24/2017 178  98 - 192 U/L Final   Performed at [facility], [address], [place], [place] [address]     CT CAP done 05/04/2017: 1.  Slightly reduced size of the hypoenhancing pancreatic tail mass which invades the splenic hilum. 2. Stable appearance of scattered hypodense hepatic lesions, favoring treated metastatic lesions. 3. Intrahepatic and extrahepatic biliary dilatation, with intrahepatic component most prominent in the right posterior ductal system. There may be a component of chronic portal vein thrombosis in the right posterior liver. Orders Placed This Encounter  Procedures  . CT Chest W Contrast    Standing Status:   Future    Standing Expiration Date:   07/22/2017    Order Specific Question:   If indicated for the ordered procedure, I authorize the administration of contrast media per Radiology protocol    Answer:   Yes    Order Specific Question:   Preferred imaging location?  Answer:   [facility]    Order Specific Question:   Radiology Contrast Protocol - do NOT remove file path    Answer:   \\[email or web address]  . CT Abdomen Pelvis W Contrast    Standing Status:   Future    Standing Expiration Date:   06/24/2018    Order Specific Question:   If indicated for the ordered procedure, I authorize the administration of contrast media per Radiology protocol    Answer:   Yes    Order Specific Question:   Preferred imaging location?    Answer:   [facility]    Order Specific Question:   Radiology Contrast Protocol - do NOT remove file path    Answer:   \\[email or web address]  . CBC with Differential/Platelet    Standing Status:   Future    Standing Expiration Date:   06/24/2018  . Comprehensive metabolic panel    Standing Status:   Future    Standing Expiration Date:   06/24/2018  . Lactate dehydrogenase    Standing Status:   Future    Standing Expiration Date:   06/24/2018        Zoila Shutter MD

## 2017-07-07 ENCOUNTER — Other Ambulatory Visit (HOSPITAL_COMMUNITY): Payer: Self-pay | Admitting: *Deleted

## 2017-07-07 ENCOUNTER — Other Ambulatory Visit (HOSPITAL_COMMUNITY): Payer: Self-pay

## 2017-07-07 DIAGNOSIS — Z91041 Radiographic dye allergy status: Secondary | ICD-10-CM

## 2017-07-07 DIAGNOSIS — T50995D Adverse effect of other drugs, medicaments and biological substances, subsequent encounter: Secondary | ICD-10-CM

## 2017-07-07 MED ORDER — DIPHENHYDRAMINE HCL 50 MG PO TABS: ORAL_TABLET | ORAL | 0 refills | Status: DC

## 2017-07-07 MED ORDER — PREDNISONE 50 MG PO TABS: ORAL_TABLET | ORAL | 0 refills | Status: DC

## 2017-07-08 ENCOUNTER — Other Ambulatory Visit: Payer: Self-pay

## 2017-07-08 ENCOUNTER — Other Ambulatory Visit: Payer: Self-pay | Admitting: Oncology

## 2017-07-08 ENCOUNTER — Inpatient Hospital Stay (HOSPITAL_COMMUNITY): Payer: Medicare HMO

## 2017-07-08 ENCOUNTER — Other Ambulatory Visit (HOSPITAL_COMMUNITY): Payer: Self-pay | Admitting: Adult Health

## 2017-07-08 ENCOUNTER — Encounter (HOSPITAL_COMMUNITY): Payer: Self-pay

## 2017-07-08 VITALS — BP 178/62 | HR 54 | Temp 98.6°F | Resp 16 | Wt 124.4 lb

## 2017-07-08 DIAGNOSIS — C259 Malignant neoplasm of pancreas, unspecified: Secondary | ICD-10-CM

## 2017-07-08 DIAGNOSIS — C252 Malignant neoplasm of tail of pancreas: Secondary | ICD-10-CM | POA: Diagnosis not present

## 2017-07-08 DIAGNOSIS — Z5111 Encounter for antineoplastic chemotherapy: Secondary | ICD-10-CM | POA: Diagnosis not present

## 2017-07-08 DIAGNOSIS — C787 Secondary malignant neoplasm of liver and intrahepatic bile duct: Secondary | ICD-10-CM | POA: Diagnosis not present

## 2017-07-08 LAB — COMPREHENSIVE METABOLIC PANEL
ALT: 30 U/L (ref 14–54)
AST: 42 U/L — ABNORMAL HIGH (ref 15–41)
Albumin: 3.2 g/dL — ABNORMAL LOW (ref 3.5–5.0)
Alkaline Phosphatase: 237 U/L — ABNORMAL HIGH (ref 38–126)
Anion gap: 9 (ref 5–15)
BUN: 13 mg/dL (ref 6–20)
CHLORIDE: 105 mmol/L (ref 101–111)
CO2: 25 mmol/L (ref 22–32)
Calcium: 8.5 mg/dL — ABNORMAL LOW (ref 8.9–10.3)
Creatinine, Ser: 0.96 mg/dL (ref 0.44–1.00)
GFR, EST NON AFRICAN AMERICAN: 52 mL/min — AB (ref >60)
Glucose, Bld: 127 mg/dL — ABNORMAL HIGH (ref 65–99)
POTASSIUM: 3.6 mmol/L (ref 3.5–5.1)
Sodium: 139 mmol/L (ref 135–145)
Total Bilirubin: 0.7 mg/dL (ref 0.3–1.2)
Total Protein: 6.7 g/dL (ref 6.5–8.1)

## 2017-07-08 LAB — CBC WITH DIFFERENTIAL/PLATELET
BASOS ABS: 0 10*3/uL (ref 0.0–0.1)
Basophils Relative: 0 %
Eosinophils Absolute: 0.2 10*3/uL (ref 0.0–0.7)
Eosinophils Relative: 4 %
HCT: 31.2 % — ABNORMAL LOW (ref 36.0–46.0)
HEMOGLOBIN: 10.1 g/dL — AB (ref 12.0–15.0)
LYMPHS ABS: 1.6 10*3/uL (ref 0.7–4.0)
LYMPHS PCT: 33 %
MCH: 29.2 pg (ref 26.0–34.0)
MCHC: 32.4 g/dL (ref 30.0–36.0)
MCV: 90.2 fL (ref 78.0–100.0)
Monocytes Absolute: 0.3 10*3/uL (ref 0.1–1.0)
Monocytes Relative: 7 %
NEUTROS PCT: 56 %
Neutro Abs: 2.7 10*3/uL (ref 1.7–7.7)
Platelets: 136 10*3/uL — ABNORMAL LOW (ref 150–400)
RBC: 3.46 MIL/uL — AB (ref 3.87–5.11)
RDW: 17.9 % — ABNORMAL HIGH (ref 11.5–15.5)
WBC: 4.8 10*3/uL (ref 4.0–10.5)

## 2017-07-08 LAB — LACTATE DEHYDROGENASE: LDH: 164 U/L (ref 98–192)

## 2017-07-08 MED ORDER — SODIUM CHLORIDE 0.9 % IV SOLN
Freq: Once | INTRAVENOUS | Status: AC
Start: 2017-07-08 — End: 2017-07-08
  Administered 2017-07-08: 11:00:00 via INTRAVENOUS

## 2017-07-08 MED ORDER — SODIUM CHLORIDE 0.9 % IV SOLN
800.0000 mg/m2 | Freq: Once | INTRAVENOUS | Status: AC
  Administered 2017-07-08: 1292 mg via INTRAVENOUS
  Filled 2017-07-08: qty 10.52

## 2017-07-08 MED ORDER — HEPARIN SOD (PORK) LOCK FLUSH 100 UNIT/ML IV SOLN
500.0000 [IU] | Freq: Once | INTRAVENOUS | Status: AC | PRN
  Administered 2017-07-08: 500 [IU]
  Filled 2017-07-08: qty 5

## 2017-07-08 MED ORDER — PROCHLORPERAZINE MALEATE 10 MG PO TABS
10.0000 mg | ORAL_TABLET | Freq: Once | ORAL | Status: AC
  Administered 2017-07-08: 10 mg via ORAL
  Filled 2017-07-08: qty 1

## 2017-07-08 MED ORDER — SODIUM CHLORIDE 0.9% FLUSH
10.0000 mL | INTRAVENOUS | Status: DC | PRN
  Administered 2017-07-08: 10 mL
  Filled 2017-07-08: qty 10

## 2017-07-08 NOTE — Patient Instructions (Signed)
[facility] Discharge Instructions for Patients Receiving Chemotherapy   Beginning January 23rd 2017 lab work for the Cancer Center will be done in the  Main lab at [facility] on 1st floor. If you have a lab appointment with the Cancer Center please come in thru the  Main Entrance and check in at the main information desk   Today you received the following chemotherapy agents   To help prevent nausea and vomiting after your treatment, we encourage you to take your nausea medication     If you develop nausea and vomiting, or diarrhea that is not controlled by your medication, call the clinic.  The clinic phone number is [phone]. Office hours are Monday-Friday 8:30am-5:00pm.  BELOW ARE SYMPTOMS THAT SHOULD BE REPORTED IMMEDIATELY:  *FEVER GREATER THAN 101.0 F  *CHILLS WITH OR WITHOUT FEVER  NAUSEA AND VOMITING THAT IS NOT CONTROLLED WITH YOUR NAUSEA MEDICATION  *UNUSUAL SHORTNESS OF BREATH  *UNUSUAL BRUISING OR BLEEDING  TENDERNESS IN MOUTH AND THROAT WITH OR WITHOUT PRESENCE OF ULCERS  *URINARY PROBLEMS  *BOWEL PROBLEMS  UNUSUAL RASH Items with * indicate a potential emergency and should be followed up as soon as possible. If you have an emergency after office hours please contact your primary care physician or go to the nearest emergency department.  Please call the clinic during office hours if you have any questions or concerns.   You may also contact the Patient Navigator at [phone] should you have any questions or need assistance in obtaining follow up care.      Resources For Cancer Patients and their Caregivers ? American Cancer Society: Can assist with transportation, wigs, general needs, runs Look Good Feel Better.        1-888-227-6333 ? Cancer Care: Provides financial assistance, online support groups, medication/co-pay assistance.  1-800-813-HOPE (4673) ? [facility] Assists [place] cancer  patients and their families through emotional , educational and financial support.  336-427-4357 ? [place] DSS Where to apply for food stamps, Medicaid and utility assistance. 336-342-1394 ? [facility]: Transportation to medical appointments. 336-347-2287 ? Social Security Administration: May apply for disability if have a Stage IV cancer. 336-342-7796 1-800-772-1213 ? [place] Aging, Disability and Transit Services: Assists with nutrition, care and transit needs. 336-349-2343         

## 2017-07-08 NOTE — Telephone Encounter (Signed)
Please print this Rx for Dr. Walden Field to sign, as she was the last provider to see this patient.   Mike Craze, NP [place] [phone]

## 2017-07-08 NOTE — Progress Notes (Signed)
Labs reviewed by MD. Proceed with treatment.  Treatment given per orders. Patient tolerated it well without problems. Vitals stable and discharged home from clinic ambulatory. Follow up as scheduled.

## 2017-07-10 ENCOUNTER — Other Ambulatory Visit (HOSPITAL_COMMUNITY): Payer: Self-pay

## 2017-07-10 DIAGNOSIS — C259 Malignant neoplasm of pancreas, unspecified: Secondary | ICD-10-CM

## 2017-07-10 DIAGNOSIS — C787 Secondary malignant neoplasm of liver and intrahepatic bile duct: Principal | ICD-10-CM

## 2017-07-10 MED ORDER — ERLOTINIB HCL 100 MG PO TABS: 100.0000 mg | ORAL_TABLET | Freq: Every day | ORAL | 1 refills | Status: DC

## 2017-07-10 NOTE — Telephone Encounter (Signed)
Received refill request from patients pharmacy for Tarceva. Reviewed with provider, chart checked and refilled.  

## 2017-07-13 ENCOUNTER — Ambulatory Visit (HOSPITAL_COMMUNITY)
Admission: RE | Admit: 2017-07-13 | Discharge: 2017-07-13 | Disposition: A | Discharge status: Home / Self Care | Payer: Medicare HMO | Source: Ambulatory Visit | Attending: Internal Medicine | Admitting: Internal Medicine

## 2017-07-13 DIAGNOSIS — C259 Malignant neoplasm of pancreas, unspecified: Secondary | ICD-10-CM | POA: Diagnosis not present

## 2017-07-13 DIAGNOSIS — C787 Secondary malignant neoplasm of liver and intrahepatic bile duct: Secondary | ICD-10-CM | POA: Diagnosis not present

## 2017-07-13 MED ORDER — IOPAMIDOL (ISOVUE-300) INJECTION 61%
100.0000 mL | Freq: Once | INTRAVENOUS | Status: AC | PRN
  Administered 2017-07-13: 100 mL via INTRAVENOUS

## 2017-07-15 ENCOUNTER — Ambulatory Visit (HOSPITAL_COMMUNITY): Payer: Medicare HMO | Admitting: Internal Medicine

## 2017-07-21 ENCOUNTER — Inpatient Hospital Stay (HOSPITAL_COMMUNITY): Payer: Medicare HMO | Attending: Internal Medicine | Admitting: Internal Medicine

## 2017-07-21 ENCOUNTER — Other Ambulatory Visit: Payer: Self-pay

## 2017-07-21 ENCOUNTER — Encounter (HOSPITAL_COMMUNITY): Payer: Self-pay | Admitting: Internal Medicine

## 2017-07-21 VITALS — BP 148/60 | HR 51 | Temp 98.1°F | Resp 16 | Wt 123.8 lb

## 2017-07-21 DIAGNOSIS — Z5111 Encounter for antineoplastic chemotherapy: Secondary | ICD-10-CM | POA: Diagnosis present

## 2017-07-21 DIAGNOSIS — C252 Malignant neoplasm of tail of pancreas: Secondary | ICD-10-CM | POA: Insufficient documentation

## 2017-07-21 DIAGNOSIS — I10 Essential (primary) hypertension: Secondary | ICD-10-CM

## 2017-07-21 DIAGNOSIS — C259 Malignant neoplasm of pancreas, unspecified: Secondary | ICD-10-CM

## 2017-07-21 DIAGNOSIS — R7989 Other specified abnormal findings of blood chemistry: Secondary | ICD-10-CM | POA: Insufficient documentation

## 2017-07-21 DIAGNOSIS — C787 Secondary malignant neoplasm of liver and intrahepatic bile duct: Secondary | ICD-10-CM

## 2017-07-21 DIAGNOSIS — R197 Diarrhea, unspecified: Secondary | ICD-10-CM | POA: Diagnosis not present

## 2017-07-21 MED ORDER — ERLOTINIB HCL 100 MG PO TABS: 100.0000 mg | ORAL_TABLET | Freq: Every day | ORAL | 1 refills | Status: DC

## 2017-07-21 NOTE — Progress Notes (Signed)
Diagnosis Pancreatic cancer metastasized to liver ([place]) - Plan: CBC with Differential/Platelet, Comprehensive metabolic panel, Lactate dehydrogenase  Staging Cancer Staging Pancreatic cancer metastasized to liver [facility]) Staging form: Pancreas, AJCC 7th Edition - Clinical: Stage IV (T3, N1, M1) - Signed by Baird Cancer, PA-C on 03/16/2014 - Pathologic: No stage assigned - Unsigned   Assessment and Plan:  1.  Pancreatic cancer metastasized to liver [facility]).  84 y.o.  female with diagnosis of stage IV pancreatic adenocarcinoma metastatic to the liver.  Pt on gemcitabine and erlotinib.  She is here today to go over scans.  CT abdomen and pelvis that was performed on 07/13/2017 shows stable mass in the tail of the pancreas with stable biliary duct dilatation but no obstruction was seen.  She also has evidence of stable small hypodense lesions in the liver with no signs of progression.  The patient will continue Gemzar and erlotinib.  We will modify her dosing to day 1 and day 8.  She will return to clinic on July 29, 2017 for cycle 7 of therapy.  She will be seen for follow-up on August 19, 2017  prior to cycle 8 of therapy.    2.  Hypertension:  BP 148/60.    Followup with PCP.    3.  Abnormal LFTs.  Recent labs were improved.  Bilirubin was 0.7.  CT that was done on 07/13/2017 shows stable biliary duct dilatation with no obstruction.  We will continue to monitor chemistries as therapy proceeds.    4.  Diarrhea.  She should continue Imodium as recommended.  She should notify the office if symptoms worsen.  Interval History:  Pt with metastatic Pancreatic cancer.  She is currently on Gemzar and erlotinib.  Current status:  Pt for follow-up.  She is here to go over scans.     Pancreatic cancer metastasized to liver ([place])   03/10/2014 Initial Diagnosis    Pancreatic cancer metastasized to liver      03/22/2014 - 03/05/2016 Chemotherapy    Abraxane/Gemzar days 1, 8, every 28 days.  Day 15 was  cancelled due to leukopenia and thrombocytopenia on day 15 cycle 1.      05/24/2014 Treatment Plan Change    Day 8 of cycle 3 is held with ANC of 1.1      06/05/2014 Imaging    CT C/A/P . Interval decrease in size of the pancreatic tail mass.Improved hepatic metastatic disease. No new lesions. No CT findings for metastatic disease involving the chest.      08/09/2014 Tumor Marker    CA 19-9= 33 (WNL)      10/26/2014 Imaging    MRI- Continued interval decrease in size of the hepatic metastatic lesions and no new lesions are identified. Continued decrease in size of the pancreatic tail lesion.      01/03/2015 Tumor Marker    Results for PHELAN, GOERS (MRN [id number]) as of 01/18/2015 12:52  01/03/2015 10:00 CA 19-9: 14       01/17/2015 Imaging    MRI- Response to therapy of hepatic metastasis.  Similar size of a pancreatic tail lesion.      03/20/2015 Imaging    MRI-L spine- Severe disc space narrowing at L2-L3, with endplate reactive changes. Large disc extrusion into the ventral epidural space,central to the RIGHT with a cephalad migrated free fragment. Additional Large disc extrusion into the retroperitone...      03/22/2015 Imaging    CT pelvis- No evidence for metastatic disease within the pelvis.  07/24/2015 Imaging    MRI abd- Continued response to therapy, with no residual detectable liver metastases. No new sites of metastatic disease in the abdomen.       08/15/2015 Code Status     She confirms desire for DNR status.      12/21/2015 Imaging    MRI liver- Severe image degradation due to motion artifact reducing diagnostic sensitivity and specificity. Reduce conspicuity of the pancreatic tail lesion suggesting further improvement. The original liver lesions have resolved.      03/05/2016 Treatment Plan Change    Chemotherapy holiday/Break after 2 years worth of treatment      04/07/2016 Imaging    CT chest- When compared to recent chest CT, new minimally displaced  anterior left sixth rib fracture. Slight increase in subcarinal adenopathy      04/28/2016 Imaging    Bone density- BMD as determined from Femur Neck Right is 0.705 g/cm2 with a T-Score of -2.4. This patient is considered osteopenic according to [facility]) criteria. Compared with the prior study on 02/23/2013, the BMD of the lumbar spine/rt. femoral neck show a statistically significant decrease.       05/23/2016 Imaging    MRI abd- 1. Exam is significantly degraded by patient respiratory motion. Consider follow-up exams with CT abdomen with without contrast per pancreatic protocol. 2. Fullness in tail of pancreas is more prominent and potentially increased in size. Recommend close attention on follow-up. (Consider CT as above) 3. No explanation for back pain.      09/11/2016 Imaging    MRI pancreas- Interval progression of pancreatic tail lesion. New differential perfusion right liver with areas of heterogeneity. Underlying metastatic disease in this region not excluded.      09/11/2016 Progression    MRi in conjunction with rising CA 19-9 are indicative of relapse of disease.      01/09/2017 Progression    CT C/A/P: 2.1 x 2.9 cm lesion in the pancreatic tail and abutting the splenic hilum, corresponding to known primary pancreatic neoplasm, mildly increased.  Scattered small hypoenhancing lesions in the liver measuring up to 10 mm, approximately 8-10 in number, suspicious for hepatic metastases.  No findings specific for metastatic disease in the chest.  Additional ancillary findings as above.        Problem List Patient Active Problem List   Diagnosis Date Noted  . Thrombocytopenia ([place]) [D69.6] 01/10/2017  . Neuropathy due to chemotherapeutic drug ([place]) [G62.0, T45.1X5A] 01/10/2017  . Depression (emotion) [F32.9] 12/27/2016  . At high risk for falls [Z91.81] 09/22/2015  . Anemia due to antineoplastic chemotherapy [D64.81, T45.1X5A]  09/12/2015  . DNR (do not resuscitate) [Z66] 08/16/2015  . Chronic low back pain with sciatica [M54.40, G89.29] 06/03/2015  . Hx of adenomatous colonic polyps [Z86.010] 04/24/2015  . Right knee pain [M25.561] 04/14/2015  . Allergic rhinitis [J30.9] 12/12/2014  . Pancreatic cancer metastasized to liver ([place]) [C25.9, C78.7] 03/10/2014  . Abnormal liver ultrasound [R93.2] 02/27/2014  . Anemia, iron deficiency [D50.9] 11/03/2013  . IGT (impaired glucose tolerance) [R73.02] 02/19/2013  . Primary generalized (osteo)arthritis [M15.0] 05/12/2012  . Anxiety [F41.9] 09/15/2011  . GERD (gastroesophageal reflux disease) [K21.9] 08/20/2010  . Essential hypertension [I10] 01/07/2010    Past Medical History Past Medical History:  Diagnosis Date  . Anemia due to antineoplastic chemotherapy 09/12/2015   Started Aranesp 500 mcg on 09/12/2015  . Anxiety   . Chronic diarrhea   . Depression   . DNR (do not resuscitate) 08/16/2015  . Erosive  esophagitis   . GERD (gastroesophageal reflux disease)   . Hx of adenomatous colonic polyps    tubular adenomas, last found in 2008  . Hyperplastic colon polyp 03/19/10   tcs by Dr. Gala Romney  . Hypertension 20 years   . Kidney stone    hx/ crushed   . Opioid contract exists 04/18/2015   With Dr. Moshe Cipro  . Pancreatic cancer ([place]) 02/2014  . Pancreatic cancer metastasized to liver ([place]) 03/10/2014  . Schatzki's ring 08/26/10   Last dilated on EGD by Dr. Trevor Iha [facility], linear gastric erosions, BI hemigastrectomy    Past Surgical History Past Surgical History:  Procedure Laterality Date  . BREAST LUMPECTOMY Left   . bunion removal     from both feet   . CHOLECYSTECTOMY  1965   . COLONOSCOPY  03/19/2010   DR Gala Romney,, normal TI, pancolonic diverticula, random colon bx neg., hyperplastic polyps removed  . ESOPHAGOGASTRODUODENOSCOPY  11/22/2003   DR Gala Romney, erosive RE, Billroth I  . ESOPHAGOGASTRODUODENOSCOPY  08/26/10   Dr. Gala Romney- moderate severe ERE, Scahtzki ring  s/p dilation, Billroth I, linear gastric erosions, bx-gastric xanthelasma  . LEFT SHOULDER SURGERY  2009   DR HARRISON  . ORIF ANKLE FRACTURE Right 05/22/2013   Procedure: OPEN REDUCTION INTERNAL FIXATION (ORIF) RIGHT ANKLE FRACTURE;  Surgeon: Sanjuana Kava, MD;  Location: [facility];  Service: Orthopedics;  Laterality: Right;  . stomach ulcer  50 years ago    had some of her stomach removed     Family History Family History  Problem Relation Age of Onset  . Hypertension Mother   . Kidney failure Brother        on dialysis  . Diabetes Sister   . Diabetes Sister   . Hypertension Father   . Kidney failure Sister   . Kidney Stones Brother   . Breast cancer Son   . Gout Son   . Liver disease Neg Hx   . Colon cancer Neg Hx      Social History  reports that she has been smoking cigarettes.  She has a 30.00 pack-year smoking history. she has never used smokeless tobacco. She reports that she does not drink alcohol or use drugs.  Medications  Current Outpatient Medications:  .  acetaminophen (TYLENOL) 500 MG tablet, Take 500 mg by mouth every 6 (six) hours as needed for mild pain or moderate pain. , Disp: , Rfl:  .  amLODipine (NORVASC) 10 MG tablet, TAKE 1 TABLET BY MOUTH EVERY DAY, Disp: 90 tablet, Rfl: 1 .  clonazePAM (KLONOPIN) 0.5 MG tablet, TAKE 1 TABLET BY MOUTH DAILY AS NEEDED FOR ANXIETY, Disp: 30 tablet, Rfl: 0 .  erlotinib (TARCEVA) 100 MG tablet, Take 1 tablet (100 mg total) by mouth daily. Take on an empty stomach 1 hour before meals or 2 hours after, Disp: 30 tablet, Rfl: 1 .  Gemcitabine HCl (GEMZAR IV), Inject into the vein. Day 1,day 1 every 28 days, Disp: , Rfl:  .  potassium chloride (K-DUR) 10 MEQ tablet, Take 1 tablet (10 mEq total) by mouth 2 (two) times daily., Disp: 60 tablet, Rfl: 4 .  traMADol (ULTRAM) 50 MG tablet, One tablet once daily, as needed, for uncontrolled pain, Disp: 30 tablet, Rfl: 2 .  Calcium-Phosphorus-Vitamin D [phone] MG-MG-UNIT CHEW, Chew 2  tablets by mouth daily., Disp: , Rfl:  .  cycloSPORINE (RESTASIS) 0.05 % ophthalmic emulsion, Place 1 drop into both eyes 2 (two) times daily., Disp: , Rfl:  .  lidocaine-prilocaine (EMLA) cream,  APPLY A QUARTER SIZE AMOUNT TO PORT SITE 1 HOURS PRIOR TO CHEMO DO NOT RUB IN COVER WITH PLASTIC WRAP (Patient not taking: Reported on 07/21/2017), Disp: 30 g, Rfl: 0 .  mirtazapine (REMERON) 7.5 MG tablet, Take 1 tablet (7.5 mg total) by mouth at bedtime. (Patient not taking: Reported on 07/21/2017), Disp: 30 tablet, Rfl: 2 No current facility-administered medications for this visit.   Facility-Administered Medications Ordered in Other Visits:  .  dexamethasone (DECADRON) 10 mg in sodium chloride 0.9 % 50 mL IVPB, , Intravenous, Once, [place], Kelby Fam, MD  Allergies Iohexol; Aciphex [rabeprazole sodium]; Amlodipine besylate-valsartan; Esomeprazole magnesium; Omeprazole; Ciprofloxacin; and Penicillins  Review of Systems Review of Systems - Oncology ROS as per HPI otherwise 12 point ROS is negative other than diarrhea and neuropathy.     Physical Exam  Vitals Wt Readings from Last 3 Encounters:  07/21/17 123 lb 12.8 oz (56.2 kg)  07/08/17 124 lb 6.4 oz (56.4 kg)  07/01/17 126 lb 12.8 oz (57.5 kg)   Temp Readings from Last 3 Encounters:  07/21/17 98.1 F (36.7 C) (Oral)  07/08/17 98.6 F (37 C) (Oral)  07/01/17 (!) 97.5 F (36.4 C) (Oral)   BP Readings from Last 3 Encounters:  07/21/17 (!) 148/60  07/08/17 (!) 178/62  07/01/17 (!) 171/60   Pulse Readings from Last 3 Encounters:  07/21/17 (!) 51  07/08/17 (!) 54  07/01/17 (!) 50   Constitutional: Well-developed, well-nourished, and in no distress.   HENT: Head: Normocephalic and atraumatic.  Mouth/Throat: No oropharyngeal exudate. Mucosa moist. Eyes: Pupils are equal, round, and reactive to light. Conjunctivae are normal. No scleral icterus.  Neck: Normal range of motion. Neck supple. No JVD present.  Cardiovascular: Normal rate,  regular rhythm and normal heart sounds.  Exam reveals no gallop and no friction rub.   No murmur heard. Pulmonary/Chest: Effort normal and breath sounds normal. No respiratory distress. No wheezes.No rales.  Abdominal: Soft. Bowel sounds are normal. No distension. There is no tenderness. There is no guarding.  Musculoskeletal: No edema or tenderness.  Lymphadenopathy: No cervical,axillary or supraclavicular adenopathy.  Neurological: Alert and oriented to person, place, and time. No cranial nerve deficit.  Skin: Skin is warm and dry. No rash noted. No erythema. No pallor.  Psychiatric: Affect and judgment normal.   Labs No visits with results within 3 Day(s) from this visit.  Latest known visit with results is:  Infusion on 07/08/2017  Component Date Value Ref Range Status  . Sodium 07/08/2017 139  135 - 145 mmol/L Final  . Potassium 07/08/2017 3.6  3.5 - 5.1 mmol/L Final  . Chloride 07/08/2017 105  101 - 111 mmol/L Final  . CO2 07/08/2017 25  22 - 32 mmol/L Final  . Glucose, Bld 07/08/2017 127* 65 - 99 mg/dL Final  . BUN 07/08/2017 13  6 - 20 mg/dL Final  . Creatinine, Ser 07/08/2017 0.96  0.44 - 1.00 mg/dL Final  . Calcium 07/08/2017 8.5* 8.9 - 10.3 mg/dL Final  . Total Protein 07/08/2017 6.7  6.5 - 8.1 g/dL Final  . Albumin 07/08/2017 3.2* 3.5 - 5.0 g/dL Final  . AST 07/08/2017 42* 15 - 41 U/L Final  . ALT 07/08/2017 30  14 - 54 U/L Final  . Alkaline Phosphatase 07/08/2017 237* 38 - 126 U/L Final  . Total Bilirubin 07/08/2017 0.7  0.3 - 1.2 mg/dL Final  . GFR calc non Af Amer 07/08/2017 52* >60 mL/min Final  . GFR calc Af Amer 07/08/2017 >60  >  60 mL/min Final   Comment: (NOTE) The eGFR has been calculated using the CKD EPI equation. This calculation has not been validated in all clinical situations. eGFR's persistently <60 mL/min signify possible Chronic Kidney Disease.   Georgiann Hahn gap 07/08/2017 9  5 - 15 Final   Performed at [facility], [address] [place], [place]  [address]  . WBC 07/08/2017 4.8  4.0 - 10.5 K/uL Final  . RBC 07/08/2017 3.46* 3.87 - 5.11 MIL/uL Final  . Hemoglobin 07/08/2017 10.1* 12.0 - 15.0 g/dL Final  . HCT 07/08/2017 31.2* 36.0 - 46.0 % Final  . MCV 07/08/2017 90.2  78.0 - 100.0 fL Final  . MCH 07/08/2017 29.2  26.0 - 34.0 pg Final  . MCHC 07/08/2017 32.4  30.0 - 36.0 g/dL Final  . RDW 07/08/2017 17.9* 11.5 - 15.5 % Final  . Platelets 07/08/2017 136* 150 - 400 K/uL Final  . Neutrophils Relative % 07/08/2017 56  % Final  . Neutro Abs 07/08/2017 2.7  1.7 - 7.7 K/uL Final  . Lymphocytes Relative 07/08/2017 33  % Final  . Lymphs Abs 07/08/2017 1.6  0.7 - 4.0 K/uL Final  . Monocytes Relative 07/08/2017 7  % Final  . Monocytes Absolute 07/08/2017 0.3  0.1 - 1.0 K/uL Final  . Eosinophils Relative 07/08/2017 4  % Final  . Eosinophils Absolute 07/08/2017 0.2  0.0 - 0.7 K/uL Final  . Basophils Relative 07/08/2017 0  % Final  . Basophils Absolute 07/08/2017 0.0  0.0 - 0.1 K/uL Final   Performed at [facility], [address], [place], [place] [address]  . LDH 07/08/2017 164  98 - 192 U/L Final   Performed at [facility], [address] [place], [place] [address]    CT [facility] done 07/13/2017:  IMPRESSION: 1. Stable mass in tail the pancreas which extends into the splenic hilum. 2. stable intrahepatic biliary duct dilatation greater in the RIGHT hepatic lobe which extends into the common hepatic duct. No obstructing lesion identified. 3. Stable small hypodense lesions in the liver. 4. No evidence pancreatic carcinoma progression  Pathology Orders Placed This Encounter  Procedures  . CBC with Differential/Platelet    Standing Status:   Future    Standing Expiration Date:   07/22/2018  . Comprehensive metabolic panel    Standing Status:   Future    Standing Expiration Date:   07/22/2018  . Lactate dehydrogenase    Standing Status:   Future    Standing Expiration Date:   07/22/2018       Zoila Shutter MD

## 2017-07-21 NOTE — Patient Instructions (Addendum)
  [place] at [facility] Discharge Instructions   You were seen today by Dr. Zoila Shutter She went over your scans today, your scans are stable and showed no progression. We will schedule you for treatment on 07/29/17. Continue taking Imodium for diarrhea. Try using Boost or Ensure to help with nutrition. Return in 1 months for follow up.   Thank you for choosing [place] at [facility] to provide your oncology and hematology care.  To afford each patient quality time with our provider, please arrive at least 15 minutes before your scheduled appointment time.    If you have a lab appointment with the [facility] please come in thru the  Main Entrance and check in at the main information desk  You need to re-schedule your appointment should you arrive 10 or more minutes late.  We strive to give you quality time with our providers, and arriving late affects you and other patients whose appointments are after yours.  Also, if you no show three or more times for appointments you may be dismissed from the clinic at the providers discretion.     Again, thank you for choosing [facility].  Our hope is that these requests will decrease the amount of time that you wait before being seen by our physicians.       _____________________________________________________________  Should you have questions after your visit to [facility], please contact our office at [phone] between the hours of 8:30 a.m. and 4:30 p.m.  Voicemails left after 4:30 p.m. will not be returned until the following business day.  For prescription refill requests, have your pharmacy contact our office.       Resources For Cancer Patients and their Caregivers ? American Cancer Society: Can assist with transportation, wigs, general needs, runs Look Good Feel Better.        (332)801-4769 ? Cancer Care: Provides financial assistance, online  support groups, medication/co-pay assistance.  1-800-813-HOPE (270) 075-6881) ? [place] Assists [place] cancer patients and their families through emotional , educational and financial support.  231-375-3800 ? [place] DSS Where to apply for food stamps, Medicaid and utility assistance. (438)203-4035 ? [facility]: Transportation to medical appointments. 3435359133 ? Social Security Administration: May apply for disability if have a Stage IV cancer. 450-172-4930 629 330 7163 ? [employer], Disability and Transit Services: Assists with nutrition, care and transit needs. [place] Support Programs:   > Cancer Support Group  2nd Tuesday of the month 1pm-2pm, Journey Room   > Creative Journey  3rd Tuesday of the month 1130am-1pm, Journey Room

## 2017-07-22 ENCOUNTER — Other Ambulatory Visit: Payer: Self-pay | Admitting: Family Medicine

## 2017-07-24 ENCOUNTER — Other Ambulatory Visit: Payer: Self-pay | Admitting: Family Medicine

## 2017-07-27 ENCOUNTER — Other Ambulatory Visit (HOSPITAL_COMMUNITY): Payer: Self-pay | Admitting: *Deleted

## 2017-07-28 ENCOUNTER — Encounter (HOSPITAL_COMMUNITY): Payer: Medicare HMO | Admitting: Dietician

## 2017-07-28 ENCOUNTER — Other Ambulatory Visit: Payer: Self-pay

## 2017-07-28 MED ORDER — CLONAZEPAM 0.5 MG PO TABS: ORAL_TABLET | ORAL | 4 refills | Status: DC

## 2017-07-29 ENCOUNTER — Encounter (HOSPITAL_COMMUNITY): Payer: Self-pay

## 2017-07-29 ENCOUNTER — Ambulatory Visit (HOSPITAL_COMMUNITY)
Admission: RE | Admit: 2017-07-29 | Discharge: 2017-07-29 | Disposition: A | Discharge status: Home / Self Care | Payer: Medicare HMO | Source: Ambulatory Visit | Attending: Internal Medicine | Admitting: Internal Medicine

## 2017-07-29 ENCOUNTER — Inpatient Hospital Stay (HOSPITAL_COMMUNITY): Payer: Medicare HMO

## 2017-07-29 ENCOUNTER — Other Ambulatory Visit: Payer: Self-pay

## 2017-07-29 VITALS — BP 170/55 | HR 63 | Temp 98.1°F | Resp 18 | Wt 129.0 lb

## 2017-07-29 DIAGNOSIS — R7989 Other specified abnormal findings of blood chemistry: Secondary | ICD-10-CM | POA: Diagnosis not present

## 2017-07-29 DIAGNOSIS — R197 Diarrhea, unspecified: Secondary | ICD-10-CM | POA: Diagnosis not present

## 2017-07-29 DIAGNOSIS — C259 Malignant neoplasm of pancreas, unspecified: Secondary | ICD-10-CM | POA: Diagnosis not present

## 2017-07-29 DIAGNOSIS — I10 Essential (primary) hypertension: Secondary | ICD-10-CM | POA: Diagnosis not present

## 2017-07-29 DIAGNOSIS — C787 Secondary malignant neoplasm of liver and intrahepatic bile duct: Secondary | ICD-10-CM | POA: Diagnosis not present

## 2017-07-29 DIAGNOSIS — R079 Chest pain, unspecified: Secondary | ICD-10-CM | POA: Diagnosis not present

## 2017-07-29 DIAGNOSIS — Z5111 Encounter for antineoplastic chemotherapy: Secondary | ICD-10-CM | POA: Diagnosis not present

## 2017-07-29 DIAGNOSIS — C252 Malignant neoplasm of tail of pancreas: Secondary | ICD-10-CM | POA: Diagnosis not present

## 2017-07-29 DIAGNOSIS — R05 Cough: Secondary | ICD-10-CM | POA: Diagnosis not present

## 2017-07-29 LAB — CBC WITH DIFFERENTIAL/PLATELET
BASOS ABS: 0 10*3/uL (ref 0.0–0.1)
Basophils Relative: 0 %
EOS PCT: 2 %
Eosinophils Absolute: 0.1 10*3/uL (ref 0.0–0.7)
HCT: 32.2 % — ABNORMAL LOW (ref 36.0–46.0)
Hemoglobin: 10.4 g/dL — ABNORMAL LOW (ref 12.0–15.0)
LYMPHS PCT: 25 %
Lymphs Abs: 1.7 10*3/uL (ref 0.7–4.0)
MCH: 29 pg (ref 26.0–34.0)
MCHC: 32.3 g/dL (ref 30.0–36.0)
MCV: 89.7 fL (ref 78.0–100.0)
MONO ABS: 0.5 10*3/uL (ref 0.1–1.0)
Monocytes Relative: 7 %
Neutro Abs: 4.5 10*3/uL (ref 1.7–7.7)
Neutrophils Relative %: 66 %
PLATELETS: 249 10*3/uL (ref 150–400)
RBC: 3.59 MIL/uL — ABNORMAL LOW (ref 3.87–5.11)
RDW: 18.3 % — AB (ref 11.5–15.5)
WBC: 6.8 10*3/uL (ref 4.0–10.5)

## 2017-07-29 LAB — COMPREHENSIVE METABOLIC PANEL
ALK PHOS: 207 U/L — AB (ref 38–126)
ALT: 27 U/L (ref 14–54)
AST: 39 U/L (ref 15–41)
Albumin: 3.2 g/dL — ABNORMAL LOW (ref 3.5–5.0)
Anion gap: 9 (ref 5–15)
BILIRUBIN TOTAL: 0.8 mg/dL (ref 0.3–1.2)
BUN: 24 mg/dL — AB (ref 6–20)
CALCIUM: 8.3 mg/dL — AB (ref 8.9–10.3)
CO2: 23 mmol/L (ref 22–32)
CREATININE: 1.44 mg/dL — AB (ref 0.44–1.00)
Chloride: 106 mmol/L (ref 101–111)
GFR calc Af Amer: 37 mL/min — ABNORMAL LOW (ref >60)
GFR calc non Af Amer: 32 mL/min — ABNORMAL LOW (ref >60)
GLUCOSE: 118 mg/dL — AB (ref 65–99)
POTASSIUM: 3.9 mmol/L (ref 3.5–5.1)
Sodium: 138 mmol/L (ref 135–145)
TOTAL PROTEIN: 6.6 g/dL (ref 6.5–8.1)

## 2017-07-29 LAB — LACTATE DEHYDROGENASE: LDH: 176 U/L (ref 98–192)

## 2017-07-29 MED ORDER — SODIUM CHLORIDE 0.9% FLUSH
10.0000 mL | INTRAVENOUS | Status: DC | PRN
  Administered 2017-07-29: 10 mL
  Filled 2017-07-29: qty 10

## 2017-07-29 MED ORDER — PROCHLORPERAZINE MALEATE 10 MG PO TABS
ORAL_TABLET | ORAL | Status: AC
  Filled 2017-07-29: qty 1

## 2017-07-29 MED ORDER — HEPARIN SOD (PORK) LOCK FLUSH 100 UNIT/ML IV SOLN
500.0000 [IU] | Freq: Once | INTRAVENOUS | Status: AC | PRN
  Administered 2017-07-29: 500 [IU]
  Filled 2017-07-29: qty 5

## 2017-07-29 MED ORDER — SODIUM CHLORIDE 0.9 % IV SOLN
800.0000 mg/m2 | Freq: Once | INTRAVENOUS | Status: AC
  Administered 2017-07-29: 1292 mg via INTRAVENOUS
  Filled 2017-07-29: qty 10.52

## 2017-07-29 MED ORDER — PROCHLORPERAZINE MALEATE 10 MG PO TABS
10.0000 mg | ORAL_TABLET | Freq: Once | ORAL | Status: AC
  Administered 2017-07-29: 10 mg via ORAL

## 2017-07-29 MED ORDER — SODIUM CHLORIDE 0.9 % IV SOLN
Freq: Once | INTRAVENOUS | Status: AC
  Administered 2017-07-29: 11:00:00 via INTRAVENOUS

## 2017-07-29 NOTE — Patient Instructions (Signed)
[facility] Discharge Instructions for Patients Receiving Chemotherapy   Beginning January 23rd 2017 lab work for the Cancer Center will be done in the  Main lab at [place] on 1st floor. If you have a lab appointment with the Cancer Center please come in thru the  Main Entrance and check in at the main information desk   Today you received the following chemotherapy agents   To help prevent nausea and vomiting after your treatment, we encourage you to take your nausea medication     If you develop nausea and vomiting, or diarrhea that is not controlled by your medication, call the clinic.  The clinic phone number is [phone]. Office hours are Monday-Friday 8:30am-5:00pm.  BELOW ARE SYMPTOMS THAT SHOULD BE REPORTED IMMEDIATELY:  *FEVER GREATER THAN 101.0 F  *CHILLS WITH OR WITHOUT FEVER  NAUSEA AND VOMITING THAT IS NOT CONTROLLED WITH YOUR NAUSEA MEDICATION  *UNUSUAL SHORTNESS OF BREATH  *UNUSUAL BRUISING OR BLEEDING  TENDERNESS IN MOUTH AND THROAT WITH OR WITHOUT PRESENCE OF ULCERS  *URINARY PROBLEMS  *BOWEL PROBLEMS  UNUSUAL RASH Items with * indicate a potential emergency and should be followed up as soon as possible. If you have an emergency after office hours please contact your primary care physician or go to the nearest emergency department.  Please call the clinic during office hours if you have any questions or concerns.   You may also contact the Patient Navigator at [phone] should you have any questions or need assistance in obtaining follow up care.      Resources For Cancer Patients and their Caregivers ? American Cancer Society: Can assist with transportation, wigs, general needs, runs Look Good Feel Better.        1-888-227-6333 ? Cancer Care: Provides financial assistance, online support groups, medication/co-pay assistance.  1-800-813-HOPE (4673) ? [facility] Assists [place] cancer  patients and their families through emotional , educational and financial support.  336-427-4357 ? [place] DSS Where to apply for food stamps, Medicaid and utility assistance. 336-342-1394 ? [facility]: Transportation to medical appointments. 336-347-2287 ? Social Security Administration: May apply for disability if have a Stage IV cancer. 336-342-7796 1-800-772-1213 ? [place] Aging, Disability and Transit Services: Assists with nutrition, care and transit needs. [phone]         

## 2017-07-29 NOTE — Progress Notes (Signed)
Labs reviewed with MD today. Proceed with treatment.  Treatment given per orders. Patient tolerated it well without problems. Vitals stable and discharged home from clinic ambulatory. Follow up as scheduled.

## 2017-08-05 ENCOUNTER — Inpatient Hospital Stay (HOSPITAL_COMMUNITY): Payer: Medicare HMO

## 2017-08-05 DIAGNOSIS — I10 Essential (primary) hypertension: Secondary | ICD-10-CM | POA: Diagnosis not present

## 2017-08-05 DIAGNOSIS — R197 Diarrhea, unspecified: Secondary | ICD-10-CM | POA: Diagnosis not present

## 2017-08-05 DIAGNOSIS — C259 Malignant neoplasm of pancreas, unspecified: Secondary | ICD-10-CM

## 2017-08-05 DIAGNOSIS — Z5111 Encounter for antineoplastic chemotherapy: Secondary | ICD-10-CM | POA: Diagnosis not present

## 2017-08-05 DIAGNOSIS — R7989 Other specified abnormal findings of blood chemistry: Secondary | ICD-10-CM | POA: Diagnosis not present

## 2017-08-05 DIAGNOSIS — C252 Malignant neoplasm of tail of pancreas: Secondary | ICD-10-CM | POA: Diagnosis not present

## 2017-08-05 DIAGNOSIS — C787 Secondary malignant neoplasm of liver and intrahepatic bile duct: Principal | ICD-10-CM

## 2017-08-05 LAB — CBC WITH DIFFERENTIAL/PLATELET
BASOS ABS: 0 10*3/uL (ref 0.0–0.1)
BASOS PCT: 0 %
Eosinophils Absolute: 0 10*3/uL (ref 0.0–0.7)
Eosinophils Relative: 2 %
HCT: 29 % — ABNORMAL LOW (ref 36.0–46.0)
Hemoglobin: 9.5 g/dL — ABNORMAL LOW (ref 12.0–15.0)
LYMPHS PCT: 53 %
Lymphs Abs: 1.4 10*3/uL (ref 0.7–4.0)
MCH: 29.2 pg (ref 26.0–34.0)
MCHC: 32.8 g/dL (ref 30.0–36.0)
MCV: 89.2 fL (ref 78.0–100.0)
Monocytes Absolute: 0.3 10*3/uL (ref 0.1–1.0)
Monocytes Relative: 10 %
Neutro Abs: 0.9 10*3/uL — ABNORMAL LOW (ref 1.7–7.7)
Neutrophils Relative %: 35 %
Platelets: 128 10*3/uL — ABNORMAL LOW (ref 150–400)
RBC: 3.25 MIL/uL — AB (ref 3.87–5.11)
RDW: 17.7 % — AB (ref 11.5–15.5)
WBC: 2.6 10*3/uL — AB (ref 4.0–10.5)

## 2017-08-05 LAB — LACTATE DEHYDROGENASE: LDH: 173 U/L (ref 98–192)

## 2017-08-05 LAB — COMPREHENSIVE METABOLIC PANEL
ALK PHOS: 172 U/L — AB (ref 38–126)
ALT: 33 U/L (ref 14–54)
ANION GAP: 10 (ref 5–15)
AST: 30 U/L (ref 15–41)
Albumin: 2.9 g/dL — ABNORMAL LOW (ref 3.5–5.0)
BILIRUBIN TOTAL: 0.8 mg/dL (ref 0.3–1.2)
BUN: 19 mg/dL (ref 6–20)
CALCIUM: 8.3 mg/dL — AB (ref 8.9–10.3)
CO2: 23 mmol/L (ref 22–32)
Chloride: 106 mmol/L (ref 101–111)
Creatinine, Ser: 1.16 mg/dL — ABNORMAL HIGH (ref 0.44–1.00)
GFR calc Af Amer: 48 mL/min — ABNORMAL LOW (ref >60)
GFR, EST NON AFRICAN AMERICAN: 41 mL/min — AB (ref >60)
GLUCOSE: 134 mg/dL — AB (ref 65–99)
Potassium: 3.9 mmol/L (ref 3.5–5.1)
Sodium: 139 mmol/L (ref 135–145)
TOTAL PROTEIN: 6.3 g/dL — AB (ref 6.5–8.1)

## 2017-08-05 MED ORDER — HEPARIN SOD (PORK) LOCK FLUSH 100 UNIT/ML IV SOLN
500.0000 [IU] | Freq: Once | INTRAVENOUS | Status: AC
Start: 2017-08-05 — End: 2017-08-05
  Administered 2017-08-05: 500 [IU] via INTRAVENOUS

## 2017-08-05 NOTE — Progress Notes (Signed)
Labs reviewed with Dr. Walden Field.  Will hold tx today per MD d/t neutropenia.  Pt to return next week for repeat labs and possible tx.  Pt notified and verbalizes understanding.  Discharged ambulatory.

## 2017-08-11 ENCOUNTER — Encounter: Payer: Self-pay | Admitting: Family Medicine

## 2017-08-11 ENCOUNTER — Other Ambulatory Visit: Payer: Self-pay

## 2017-08-11 ENCOUNTER — Ambulatory Visit (INDEPENDENT_AMBULATORY_CARE_PROVIDER_SITE_OTHER): Payer: Medicare HMO | Admitting: Family Medicine

## 2017-08-11 VITALS — BP 170/70 | HR 68 | Temp 98.2°F | Resp 16 | Ht 64.0 in | Wt 132.8 lb

## 2017-08-11 DIAGNOSIS — N200 Calculus of kidney: Secondary | ICD-10-CM

## 2017-08-11 DIAGNOSIS — Z0000 Encounter for general adult medical examination without abnormal findings: Secondary | ICD-10-CM

## 2017-08-11 NOTE — Patient Instructions (Addendum)
Wellness with nurse   End August, call if you need me before  F/U with MD early November  Exam is excellent and you are doing very well, PLEASE continue to ENJOY life and live it to its fullest, you are truly blessed and I am happy that you know that  Thank you  for choosing [facility]. We consider it a privelige to serve you.  Delivering excellent health care in a caring and  compassionate way is our goal.  Partnering with you,  so that together we can achieve this goal is our strategy.

## 2017-08-12 ENCOUNTER — Inpatient Hospital Stay (HOSPITAL_COMMUNITY): Payer: Medicare HMO | Attending: Hematology and Oncology

## 2017-08-12 ENCOUNTER — Encounter (HOSPITAL_COMMUNITY): Payer: Self-pay

## 2017-08-12 VITALS — BP 177/62 | HR 64 | Temp 97.7°F | Resp 18 | Wt 134.0 lb

## 2017-08-12 DIAGNOSIS — C252 Malignant neoplasm of tail of pancreas: Secondary | ICD-10-CM | POA: Insufficient documentation

## 2017-08-12 DIAGNOSIS — C259 Malignant neoplasm of pancreas, unspecified: Secondary | ICD-10-CM

## 2017-08-12 DIAGNOSIS — Z5111 Encounter for antineoplastic chemotherapy: Secondary | ICD-10-CM | POA: Diagnosis present

## 2017-08-12 DIAGNOSIS — C787 Secondary malignant neoplasm of liver and intrahepatic bile duct: Secondary | ICD-10-CM | POA: Diagnosis not present

## 2017-08-12 LAB — COMPREHENSIVE METABOLIC PANEL
ALT: 24 U/L (ref 14–54)
AST: 30 U/L (ref 15–41)
Albumin: 3.1 g/dL — ABNORMAL LOW (ref 3.5–5.0)
Alkaline Phosphatase: 165 U/L — ABNORMAL HIGH (ref 38–126)
Anion gap: 9 (ref 5–15)
BUN: 25 mg/dL — ABNORMAL HIGH (ref 6–20)
CHLORIDE: 107 mmol/L (ref 101–111)
CO2: 24 mmol/L (ref 22–32)
CREATININE: 1.34 mg/dL — AB (ref 0.44–1.00)
Calcium: 8.4 mg/dL — ABNORMAL LOW (ref 8.9–10.3)
GFR calc Af Amer: 40 mL/min — ABNORMAL LOW (ref >60)
GFR, EST NON AFRICAN AMERICAN: 35 mL/min — AB (ref >60)
GLUCOSE: 100 mg/dL — AB (ref 65–99)
Potassium: 4.2 mmol/L (ref 3.5–5.1)
Sodium: 140 mmol/L (ref 135–145)
Total Bilirubin: 1 mg/dL (ref 0.3–1.2)
Total Protein: 6.6 g/dL (ref 6.5–8.1)

## 2017-08-12 LAB — CBC WITH DIFFERENTIAL/PLATELET
BASOS ABS: 0 10*3/uL (ref 0.0–0.1)
BASOS PCT: 0 %
Eosinophils Absolute: 0.1 10*3/uL (ref 0.0–0.7)
Eosinophils Relative: 2 %
HEMATOCRIT: 30.9 % — AB (ref 36.0–46.0)
HEMOGLOBIN: 10.2 g/dL — AB (ref 12.0–15.0)
LYMPHS PCT: 22 %
Lymphs Abs: 1.5 10*3/uL (ref 0.7–4.0)
MCH: 29.5 pg (ref 26.0–34.0)
MCHC: 33 g/dL (ref 30.0–36.0)
MCV: 89.3 fL (ref 78.0–100.0)
Monocytes Absolute: 0.4 10*3/uL (ref 0.1–1.0)
Monocytes Relative: 6 %
NEUTROS PCT: 70 %
Neutro Abs: 4.8 10*3/uL (ref 1.7–7.7)
Platelets: 166 10*3/uL (ref 150–400)
RBC: 3.46 MIL/uL — ABNORMAL LOW (ref 3.87–5.11)
RDW: 18.5 % — ABNORMAL HIGH (ref 11.5–15.5)
WBC: 6.8 10*3/uL (ref 4.0–10.5)

## 2017-08-12 LAB — LACTATE DEHYDROGENASE: LDH: 195 U/L — AB (ref 98–192)

## 2017-08-12 MED ORDER — GEMCITABINE HCL CHEMO INJECTION 1 GM/26.3ML
800.0000 mg/m2 | Freq: Once | INTRAVENOUS | Status: AC
  Administered 2017-08-12: 1292 mg via INTRAVENOUS
  Filled 2017-08-12: qty 23.46

## 2017-08-12 MED ORDER — SODIUM CHLORIDE 0.9 % IV SOLN
Freq: Once | INTRAVENOUS | Status: AC
  Administered 2017-08-12: 12:00:00 via INTRAVENOUS

## 2017-08-12 MED ORDER — PROCHLORPERAZINE MALEATE 10 MG PO TABS
10.0000 mg | ORAL_TABLET | Freq: Once | ORAL | Status: AC
  Administered 2017-08-12: 10 mg via ORAL
  Filled 2017-08-12: qty 1

## 2017-08-12 MED ORDER — HEPARIN SOD (PORK) LOCK FLUSH 100 UNIT/ML IV SOLN: 250.0000 [IU] | Freq: Once | INTRAVENOUS | Status: DC | PRN

## 2017-08-12 MED ORDER — COLD PACK MISC ONCOLOGY: 1.0000 | Freq: Once | Status: DC | PRN

## 2017-08-12 MED ORDER — SODIUM CHLORIDE 0.9% FLUSH
3.0000 mL | INTRAVENOUS | Status: DC | PRN
Start: 2017-08-12 — End: 2017-08-12

## 2017-08-12 MED ORDER — ALTEPLASE 2 MG IJ SOLR: 2.0000 mg | Freq: Once | INTRAMUSCULAR | Status: DC | PRN

## 2017-08-12 MED ORDER — HEPARIN SOD (PORK) LOCK FLUSH 100 UNIT/ML IV SOLN
500.0000 [IU] | Freq: Once | INTRAVENOUS | Status: AC | PRN
  Administered 2017-08-12: 500 [IU]

## 2017-08-12 MED ORDER — SODIUM CHLORIDE 0.9% FLUSH
10.0000 mL | INTRAVENOUS | Status: DC | PRN
  Administered 2017-08-12: 10 mL
  Filled 2017-08-12: qty 10

## 2017-08-12 NOTE — Patient Instructions (Signed)
[facility] Discharge Instructions for Patients Receiving Chemotherapy   Beginning January 23rd 2017 lab work for the Cancer Center will be done in the  Main lab at [facility] on 1st floor. If you have a lab appointment with the Cancer Center please come in thru the  Main Entrance and check in at the main information desk   Today you received the following chemotherapy agents Gemzar. Follow-up as scheduled. Call clinic for any questions or concerns  To help prevent nausea and vomiting after your treatment, we encourage you to take your nausea medication   If you develop nausea and vomiting, or diarrhea that is not controlled by your medication, call the clinic.  The clinic phone number is [phone]. Office hours are Monday-Friday 8:30am-5:00pm.  BELOW ARE SYMPTOMS THAT SHOULD BE REPORTED IMMEDIATELY:  *FEVER GREATER THAN 101.0 F  *CHILLS WITH OR WITHOUT FEVER  NAUSEA AND VOMITING THAT IS NOT CONTROLLED WITH YOUR NAUSEA MEDICATION  *UNUSUAL SHORTNESS OF BREATH  *UNUSUAL BRUISING OR BLEEDING  TENDERNESS IN MOUTH AND THROAT WITH OR WITHOUT PRESENCE OF ULCERS  *URINARY PROBLEMS  *BOWEL PROBLEMS  UNUSUAL RASH Items with * indicate a potential emergency and should be followed up as soon as possible. If you have an emergency after office hours please contact your primary care physician or go to the nearest emergency department.  Please call the clinic during office hours if you have any questions or concerns.   You may also contact the Patient Navigator at [phone] should you have any questions or need assistance in obtaining follow up care.      Resources For Cancer Patients and their Caregivers ? American Cancer Society: Can assist with transportation, wigs, general needs, runs Look Good Feel Better.        1-888-227-6333 ? Cancer Care: Provides financial assistance, online support groups, medication/co-pay assistance.  1-800-813-HOPE  (4673) ? [facility] Assists [place] cancer patients and their families through emotional , educational and financial support.  336-427-4357 ? [place] DSS Where to apply for food stamps, Medicaid and utility assistance. 336-342-1394 ? [facility]: Transportation to medical appointments. 336-347-2287 ? Social Security Administration: May apply for disability if have a Stage IV cancer. 336-342-7796 1-800-772-1213 ? [place] Aging, Disability and Transit Services: Assists with nutrition, care and transit needs. 336-349-2343         

## 2017-08-12 NOTE — Progress Notes (Signed)
1200 Reviewed labs with Dr. Walden Field and pt approved for Cycle 7 Day 15 Gemzar infusion per MD                 Alecia Lemming tolerated Gemzar infusion well without complaints or incident. VSS upon discharge. Pt discharged self ambulatory in satisfactory condition

## 2017-08-14 ENCOUNTER — Encounter: Payer: Self-pay | Admitting: Family Medicine

## 2017-08-14 DIAGNOSIS — N200 Calculus of kidney: Secondary | ICD-10-CM | POA: Insufficient documentation

## 2017-08-14 NOTE — Assessment & Plan Note (Signed)
Annual exam as documented. Counseling done  re healthy lifestyle involving commitment to regular  , heart healthy diet, and maintaining a healthy weight.The importance of adequate sleep also discussed. Regular seat belt use and home safety, is also discussed.

## 2017-08-14 NOTE — Progress Notes (Signed)
    [name]     MRN: [id number]      DOB: 04/02/1929  HPI: Patient is in for annual physical exam. C/o left falnk pain x weeks and wants to know the cause Wants to review recent scans from the cancer center to review  How her  Pancreatic cancer is responding to treatment  PE: BP (!) 170/70 (BP Location: Left Arm, Patient Position: Sitting, Cuff Size: Normal)   Pulse 68   Temp 98.2 F (36.8 C) (Temporal)   Resp 16   Ht 5\' 4"  (1.626 m)   Wt 132 lb 12 oz (60.2 kg)   SpO2 99%   BMI 22.79 kg/m   Pleasant  female, alert and oriented x 3, in no cardio-pulmonary distress. Afebrile. HEENT No facial trauma or asymetry. Sinuses non tender.  Extra occullar muscles intact, pupils equally reactive to light. External ears normal, tympanic membranes clear. Oropharynx moist, no exudate. Neck: supple, no adenopathy,JVD or thyromegaly.No bruits.  Chest: Clear to ascultation bilaterally.No crackles or wheezes. Non tender to palpation  Breast: No asymetry,no masses or lumps. No tenderness. No nipple discharge or inversion. No axillary or supraclavicular adenopathy No mammogram  Cardiovascular system; Heart sounds normal,  S1 and  S2 ,no S3.  No murmur, or thrill. Apical beat not displaced Peripheral pulses normal.  Abdomen: Soft, non tender, no organomegaly or masses. Left flank tenderness No bruits. Bowel sounds normal. No guarding, tenderness or rebound. CT abdomen and pelvis reports left renal calculus , non obstructing  Rectal:  Not examined, no stool cards or cologuard testing, pt being treated for metastatic stage 4 pancreatic cancer.  GU:  Not examined, not indicated, pt asymptomatic  Musculoskeletal exam:  Decreased ROM lumbar spine , adequate in  hips , shoulders and knees. No deformity ,swelling or crepitus noted. muscle wasting no  atrophy.   Neurologic: Cranial nerves 2 to 12 intact.  decreased Power and  tone ,sensation normal throughout. disturbance in  gait. Fine  tremor.  Skin: Intact, no ulceration, erythema , scaling or rash noted. Pigmentation normal throughout  Psych; Normal mood and affect. Judgement and concentration normal   Assessment & Plan:  Annual physical exam Annual exam as documented. Counseling done  re healthy lifestyle involving commitment to regular  , heart healthy diet, and maintaining a healthy weight.The importance of adequate sleep also discussed. Regular seat belt use and home safety, is also discussed.

## 2017-08-18 DIAGNOSIS — H5200 Hypermetropia, unspecified eye: Secondary | ICD-10-CM | POA: Diagnosis not present

## 2017-08-21 ENCOUNTER — Ambulatory Visit: Payer: Medicare HMO | Admitting: Urology

## 2017-08-21 DIAGNOSIS — N952 Postmenopausal atrophic vaginitis: Secondary | ICD-10-CM | POA: Diagnosis not present

## 2017-08-21 DIAGNOSIS — N3946 Mixed incontinence: Secondary | ICD-10-CM | POA: Diagnosis not present

## 2017-08-24 ENCOUNTER — Other Ambulatory Visit: Payer: Self-pay | Admitting: Family Medicine

## 2017-08-25 ENCOUNTER — Other Ambulatory Visit (HOSPITAL_COMMUNITY): Payer: Self-pay | Admitting: *Deleted

## 2017-08-25 DIAGNOSIS — C259 Malignant neoplasm of pancreas, unspecified: Secondary | ICD-10-CM

## 2017-08-26 ENCOUNTER — Inpatient Hospital Stay (HOSPITAL_BASED_OUTPATIENT_CLINIC_OR_DEPARTMENT_OTHER): Payer: Medicare HMO | Admitting: Hematology

## 2017-08-26 ENCOUNTER — Encounter (HOSPITAL_COMMUNITY): Payer: Self-pay | Admitting: Hematology

## 2017-08-26 ENCOUNTER — Inpatient Hospital Stay (HOSPITAL_COMMUNITY): Payer: Medicare HMO

## 2017-08-26 VITALS — BP 169/72 | HR 58 | Temp 98.6°F | Resp 16 | Wt 134.4 lb

## 2017-08-26 VITALS — BP 162/53 | HR 62 | Temp 98.2°F | Resp 18

## 2017-08-26 DIAGNOSIS — R197 Diarrhea, unspecified: Secondary | ICD-10-CM | POA: Diagnosis not present

## 2017-08-26 DIAGNOSIS — R634 Abnormal weight loss: Secondary | ICD-10-CM

## 2017-08-26 DIAGNOSIS — C252 Malignant neoplasm of tail of pancreas: Secondary | ICD-10-CM | POA: Diagnosis not present

## 2017-08-26 DIAGNOSIS — C787 Secondary malignant neoplasm of liver and intrahepatic bile duct: Secondary | ICD-10-CM | POA: Diagnosis not present

## 2017-08-26 DIAGNOSIS — C259 Malignant neoplasm of pancreas, unspecified: Secondary | ICD-10-CM | POA: Diagnosis not present

## 2017-08-26 DIAGNOSIS — Z5111 Encounter for antineoplastic chemotherapy: Secondary | ICD-10-CM | POA: Diagnosis not present

## 2017-08-26 LAB — CBC WITH DIFFERENTIAL/PLATELET
BASOS ABS: 0 10*3/uL (ref 0.0–0.1)
BASOS PCT: 0 %
Eosinophils Absolute: 0.2 10*3/uL (ref 0.0–0.7)
Eosinophils Relative: 2 %
HCT: 30.8 % — ABNORMAL LOW (ref 36.0–46.0)
HEMOGLOBIN: 10 g/dL — AB (ref 12.0–15.0)
LYMPHS PCT: 18 %
Lymphs Abs: 1.3 10*3/uL (ref 0.7–4.0)
MCH: 29.1 pg (ref 26.0–34.0)
MCHC: 32.5 g/dL (ref 30.0–36.0)
MCV: 89.5 fL (ref 78.0–100.0)
Monocytes Absolute: 0.6 10*3/uL (ref 0.1–1.0)
Monocytes Relative: 8 %
NEUTROS PCT: 72 %
Neutro Abs: 5.2 10*3/uL (ref 1.7–7.7)
Platelets: 200 10*3/uL (ref 150–400)
RBC: 3.44 MIL/uL — AB (ref 3.87–5.11)
RDW: 19 % — AB (ref 11.5–15.5)
WBC: 7.2 10*3/uL (ref 4.0–10.5)

## 2017-08-26 LAB — COMPREHENSIVE METABOLIC PANEL
ALBUMIN: 3.2 g/dL — AB (ref 3.5–5.0)
ALK PHOS: 194 U/L — AB (ref 38–126)
ALT: 25 U/L (ref 14–54)
AST: 31 U/L (ref 15–41)
Anion gap: 9 (ref 5–15)
BILIRUBIN TOTAL: 0.7 mg/dL (ref 0.3–1.2)
BUN: 22 mg/dL — AB (ref 6–20)
CO2: 24 mmol/L (ref 22–32)
CREATININE: 1.27 mg/dL — AB (ref 0.44–1.00)
Calcium: 8.4 mg/dL — ABNORMAL LOW (ref 8.9–10.3)
Chloride: 108 mmol/L (ref 101–111)
GFR calc Af Amer: 43 mL/min — ABNORMAL LOW (ref >60)
GFR calc non Af Amer: 37 mL/min — ABNORMAL LOW (ref >60)
GLUCOSE: 142 mg/dL — AB (ref 65–99)
POTASSIUM: 3.7 mmol/L (ref 3.5–5.1)
Sodium: 141 mmol/L (ref 135–145)
TOTAL PROTEIN: 6.6 g/dL (ref 6.5–8.1)

## 2017-08-26 MED ORDER — HEPARIN SOD (PORK) LOCK FLUSH 100 UNIT/ML IV SOLN
500.0000 [IU] | Freq: Once | INTRAVENOUS | Status: AC | PRN
  Administered 2017-08-26: 500 [IU]

## 2017-08-26 MED ORDER — SODIUM CHLORIDE 0.9% FLUSH
10.0000 mL | INTRAVENOUS | Status: DC | PRN
  Administered 2017-08-26: 10 mL
  Filled 2017-08-26: qty 10

## 2017-08-26 MED ORDER — PROCHLORPERAZINE MALEATE 10 MG PO TABS
10.0000 mg | ORAL_TABLET | Freq: Once | ORAL | Status: AC
  Administered 2017-08-26: 10 mg via ORAL

## 2017-08-26 MED ORDER — SODIUM CHLORIDE 0.9 % IV SOLN
800.0000 mg/m2 | Freq: Once | INTRAVENOUS | Status: AC
  Administered 2017-08-26: 1292 mg via INTRAVENOUS
  Filled 2017-08-26: qty 23.46

## 2017-08-26 MED ORDER — PROCHLORPERAZINE MALEATE 10 MG PO TABS
ORAL_TABLET | ORAL | Status: AC
  Filled 2017-08-26: qty 1

## 2017-08-26 MED ORDER — SODIUM CHLORIDE 0.9 % IV SOLN
Freq: Once | INTRAVENOUS | Status: AC
  Administered 2017-08-26: 10:00:00 via INTRAVENOUS

## 2017-08-26 MED ORDER — HEPARIN SOD (PORK) LOCK FLUSH 100 UNIT/ML IV SOLN
INTRAVENOUS | Status: AC
  Filled 2017-08-26: qty 5

## 2017-08-26 NOTE — Progress Notes (Signed)
1000 Labs reviewed with and pt seen by Dr. Delton Coombes and pt approved for chemo tx today per MD                                                                                  [name] tolerated Gemzar infusion well without complaints or incident. VSS upon discharge. Pt discharged self ambulatory using cane in satisfactory condition.

## 2017-08-26 NOTE — Assessment & Plan Note (Signed)
1.  Metastatic pancreatic cancer to the liver: -Foundation 1 testing shows MS and TMB cannot be determined, KRAS G12V, TP 53 - Gemcitabine and Abraxane from November 2015 through August 2018 with progression -Gemcitabine (day 1 and day 15) and Tarceva 100 mg daily q. 28 days started on 01/28/2017 -She had cycle 7 on 07/29/2017, and is tolerating very well.  CT scan done on 07/13/2017 shows stable liver disease.  Last CA 19-9 was on 06/24/2017 at 129.  She had diarrhea for 3-4 days and she attributes it to eating certain type of foods. -She may proceed with cycle 8 today without any dose modifications.  We will plan to repeat scans in 3-4 months.  I will also obtain another C in 19-9 level.  2.  Weight loss: She reports that she lost a few pounds after she had diarrhea for 4 days.  She started taking her sister's Megace 1 teaspoon daily.  She gained about 11 pounds in the last 2 weeks.  I have talked to her about the risks of taking Megace including thrombosis risk.

## 2017-08-26 NOTE — Patient Instructions (Signed)
[facility] at [facility] Discharge Instructions  You saw Dr. Katragadda today.   Thank you for choosing [facility] at [facility] to provide your oncology and hematology care.  To afford each patient quality time with our provider, please arrive at least 15 minutes before your scheduled appointment time.   If you have a lab appointment with the Cancer Center please come in thru the  Main Entrance and check in at the main information desk  You need to re-schedule your appointment should you arrive 10 or more minutes late.  We strive to give you quality time with our providers, and arriving late affects you and other patients whose appointments are after yours.  Also, if you no show three or more times for appointments you may be dismissed from the clinic at the providers discretion.     Again, thank you for choosing [facility].  Our hope is that these requests will decrease the amount of time that you wait before being seen by our physicians.       _____________________________________________________________  Should you have questions after your visit to [facility], please contact our office at [phone] between the hours of 8:30 a.m. and 4:30 p.m.  Voicemails left after 4:30 p.m. will not be returned until the following business day.  For prescription refill requests, have your pharmacy contact our office.       Resources For Cancer Patients and their Caregivers ? American Cancer Society: Can assist with transportation, wigs, general needs, runs Look Good Feel Better.        1-888-227-6333 ? Cancer Care: Provides financial assistance, online support groups, medication/co-pay assistance.  1-800-813-HOPE (4673) ? [facility] Assists [place] cancer patients and their families through emotional , educational and financial support.  336-427-4357 ? [place] DSS Where to apply for  food stamps, Medicaid and utility assistance. 336-342-1394 ? [facility]: Transportation to medical appointments. 336-347-2287 ? Social Security Administration: May apply for disability if have a Stage IV cancer. 336-342-7796 1-800-772-1213 ? [place] Aging, Disability and Transit Services: Assists with nutrition, care and transit needs. 336-349-2343  Cancer Center Support Programs:   > Cancer Support Group  2nd Tuesday of the month 1pm-2pm, Journey Room   > Creative Journey  3rd Tuesday of the month 1130am-1pm, Journey Room     

## 2017-08-26 NOTE — Progress Notes (Signed)
[place], [place] [address]   CLINIC:  Medical Oncology/Hematology  PCP:  Fayrene Helper, MD [address], [place] [address] [phone]   REASON FOR VISIT:  Follow-up for metastatic pancreatic cancer.  CURRENT THERAPY: Gemcitabine and Tarceva.  BRIEF ONCOLOGIC HISTORY:    Pancreatic cancer metastasized to liver ([place])   03/10/2014 Initial Diagnosis    Pancreatic cancer metastasized to liver      03/22/2014 - 03/05/2016 Chemotherapy    Abraxane/Gemzar days 1, 8, every 28 days.  Day 15 was cancelled due to leukopenia and thrombocytopenia on day 15 cycle 1.      05/24/2014 Treatment Plan Change    Day 8 of cycle 3 is held with ANC of 1.1      06/05/2014 Imaging    CT C/A/P . Interval decrease in size of the pancreatic tail mass.Improved hepatic metastatic disease. No new lesions. No CT findings for metastatic disease involving the chest.      08/09/2014 Tumor Marker    CA 19-9= 33 (WNL)      10/26/2014 Imaging    MRI- Continued interval decrease in size of the hepatic metastatic lesions and no new lesions are identified. Continued decrease in size of the pancreatic tail lesion.      01/03/2015 Tumor Marker    Results for [name], [name] (MRN [id number]) as of 01/18/2015 12:52  01/03/2015 10:00 CA 19-9: 14       01/17/2015 Imaging    MRI- Response to therapy of hepatic metastasis.  Similar size of a pancreatic tail lesion.      03/20/2015 Imaging    MRI-L spine- Severe disc space narrowing at L2-L3, with endplate reactive changes. Large disc extrusion into the ventral epidural space,central to the RIGHT with a cephalad migrated free fragment. Additional Large disc extrusion into the retroperitone...      03/22/2015 Imaging    CT pelvis- No evidence for metastatic disease within the pelvis.      07/24/2015 Imaging    MRI abd- Continued response to therapy, with no residual detectable liver metastases. No new  sites of metastatic disease in the abdomen.       08/15/2015 Code Status     She confirms desire for DNR status.      12/21/2015 Imaging    MRI liver- Severe image degradation due to motion artifact reducing diagnostic sensitivity and specificity. Reduce conspicuity of the pancreatic tail lesion suggesting further improvement. The original liver lesions have resolved.      03/05/2016 Treatment Plan Change    Chemotherapy holiday/Break after 2 years worth of treatment      04/07/2016 Imaging    CT chest- When compared to recent chest CT, new minimally displaced anterior left sixth rib fracture. Slight increase in subcarinal adenopathy      04/28/2016 Imaging    Bone density- BMD as determined from Femur Neck Right is 0.705 g/cm2 with a T-Score of -2.4. This patient is considered osteopenic according to [facility]) criteria. Compared with the prior study on 02/23/2013, the BMD of the lumbar spine/rt. femoral neck show a statistically significant decrease.       05/23/2016 Imaging    MRI abd- 1. Exam is significantly degraded by patient respiratory motion. Consider follow-up exams with CT abdomen with without contrast per pancreatic protocol. 2. Fullness in tail of pancreas is more prominent and potentially increased in size. Recommend close attention on follow-up. (Consider CT as above)  3. No explanation for back pain.      09/11/2016 Imaging    MRI pancreas- Interval progression of pancreatic tail lesion. New differential perfusion right liver with areas of heterogeneity. Underlying metastatic disease in this region not excluded.      09/11/2016 Progression    MRi in conjunction with rising CA 19-9 are indicative of relapse of disease.      01/09/2017 Progression    CT C/A/P: 2.1 x 2.9 cm lesion in the pancreatic tail and abutting the splenic hilum, corresponding to known primary pancreatic neoplasm, mildly increased.  Scattered small hypoenhancing  lesions in the liver measuring up to 10 mm, approximately 8-10 in number, suspicious for hepatic metastases.  No findings specific for metastatic disease in the chest.  Additional ancillary findings as above.       01/27/2017 -  Chemotherapy    The patient had gemcitabine (GEMZAR) 1,292 mg in sodium chloride 0.9 % 250 mL chemo infusion, 800 mg/m2 = 1,292 mg, Intravenous,  Once, 8 of 12 cycles Administration: 1,292 mg (01/28/2017), 1,292 mg (02/11/2017), 1,292 mg (02/25/2017), 1,292 mg (03/11/2017), 1,292 mg (03/25/2017), 1,292 mg (04/08/2017), 1,292 mg (04/22/2017), 1,292 mg (04/29/2017), 1,292 mg (05/20/2017), 1,292 mg (06/03/2017), 1,292 mg (06/24/2017), 1,292 mg (07/08/2017), 1,292 mg (07/29/2017), 1,292 mg (08/12/2017), 1,292 mg (08/26/2017)  for chemotherapy treatment.         CANCER STAGING: Cancer Staging Pancreatic cancer metastasized to liver [facility]) Staging form: Pancreas, AJCC 7th Edition - Clinical: Stage IV (T3, N1, M1) - Signed by Baird Cancer, PA-C on 03/16/2014 - Pathologic: No stage assigned - Unsigned    INTERVAL HISTORY:  [name] 84 y.o. female returns for routine follow-up and consideration for next cycle of chemotherapy.   She is receiving gemcitabine every 2 weeks.  She is tolerating it very well.  She had diarrhea for 4 days after last cycle.  She lost a few pounds.  She gained them back after taking Megace from her sister once a day.  She denies any new onset pains.  She denies any fevers, infections or hospitalizations.  He has mild fatigue which is stable.  She was reportedly diagnosed with kidney stones.  REVIEW OF SYSTEMS:  Review of Systems  Constitutional: Positive for fatigue.  HENT:  Negative.   Eyes: Negative.   Respiratory: Negative.   Cardiovascular: Positive for leg swelling.  Gastrointestinal: Positive for diarrhea.  Genitourinary: Positive for dysuria.   Musculoskeletal: Negative.   Skin: Negative.   Neurological: Negative.     Hematological: Negative.   Psychiatric/Behavioral: Negative.      PAST MEDICAL/SURGICAL HISTORY:  Past Medical History:  Diagnosis Date  . Anemia due to antineoplastic chemotherapy 09/12/2015   Started Aranesp 500 mcg on 09/12/2015  . Anxiety   . Chronic diarrhea   . Depression   . DNR (do not resuscitate) 08/16/2015  . Erosive esophagitis   . GERD (gastroesophageal reflux disease)   . Hx of adenomatous colonic polyps    tubular adenomas, last found in 2008  . Hyperplastic colon polyp 03/19/10   tcs by Dr. Gala Romney  . Hypertension 20 years   . Kidney stone    hx/ crushed   . Opioid contract exists 04/18/2015   With Dr. Moshe Cipro  . Pancreatic cancer ([place]) 02/2014  . Pancreatic cancer metastasized to liver ([place]) 03/10/2014  . Schatzki's ring 08/26/10   Last dilated on EGD by Dr. Trevor Iha [facility], linear gastric erosions, BI hemigastrectomy   Past Surgical History:  Procedure Laterality Date  .  BREAST LUMPECTOMY Left   . bunion removal     from both feet   . CHOLECYSTECTOMY  1965   . COLONOSCOPY  03/19/2010   DR Gala Romney,, normal TI, pancolonic diverticula, random colon bx neg., hyperplastic polyps removed  . ESOPHAGOGASTRODUODENOSCOPY  11/22/2003   DR Gala Romney, erosive RE, Billroth I  . ESOPHAGOGASTRODUODENOSCOPY  08/26/10   Dr. Gala Romney- moderate severe ERE, Scahtzki ring s/p dilation, Billroth I, linear gastric erosions, bx-gastric xanthelasma  . LEFT SHOULDER SURGERY  2009   DR HARRISON  . ORIF ANKLE FRACTURE Right 05/22/2013   Procedure: OPEN REDUCTION INTERNAL FIXATION (ORIF) RIGHT ANKLE FRACTURE;  Surgeon: Sanjuana Kava, MD;  Location: [facility];  Service: Orthopedics;  Laterality: Right;  . stomach ulcer  50 years ago    had some of her stomach removed      SOCIAL HISTORY:  Social History   Socioeconomic History  . Marital status: Widowed    Spouse name: Not on file  . Number of children: 2  . Years of education: Not on file  . Highest education level: Not on file  Occupational  History  . Occupation: retired from Estate manager/land agent: RETIRED  Social Needs  . Financial resource strain: Not on file  . Food insecurity:    Worry: Not on file    Inability: Not on file  . Transportation needs:    Medical: Not on file    Non-medical: Not on file  Tobacco Use  . Smoking status: Current Some Day Smoker    Packs/day: 0.50    Years: 60.00    Pack years: 30.00    Types: Cigarettes    Last attempt to quit: 09/09/2016    Years since quitting: 0.9  . Smokeless tobacco: Never Used  Substance and Sexual Activity  . Alcohol use: No  . Drug use: No  . Sexual activity: Never  Lifestyle  . Physical activity:    Days per week: Not on file    Minutes per session: Not on file  . Stress: Not on file  Relationships  . Social connections:    Talks on phone: Not on file    Gets together: Not on file    Attends religious service: Not on file    Active member of club or organization: Not on file    Attends meetings of clubs or organizations: Not on file    Relationship status: Not on file  . Intimate partner violence:    Fear of current or ex partner: Not on file    Emotionally abused: Not on file    Physically abused: Not on file    Forced sexual activity: Not on file  Other Topics Concern  . Not on file  Social History Narrative  . Not on file    FAMILY HISTORY:  Family History  Problem Relation Age of Onset  . Hypertension Mother   . Kidney failure Brother        on dialysis  . Diabetes Sister   . Diabetes Sister   . Hypertension Father   . Kidney failure Sister   . Kidney Stones Brother   . Breast cancer Son   . Gout Son   . Liver disease Neg Hx   . Colon cancer Neg Hx     CURRENT MEDICATIONS:  Outpatient Encounter Medications as of 08/26/2017  Medication Sig  . acetaminophen (TYLENOL) 500 MG tablet Take 500 mg by mouth every 6 (six) hours as needed for mild pain or moderate  pain.   . amLODipine (NORVASC) 10 MG tablet TAKE 1 TABLET BY MOUTH EVERY DAY    . Calcium-Phosphorus-Vitamin D [phone] MG-MG-UNIT CHEW Chew 2 tablets by mouth daily.  . clonazePAM (KLONOPIN) 0.5 MG tablet TAKE 1 TABLET BY MOUTH DAILY AS NEEDED FOR ANXIETY  . cycloSPORINE (RESTASIS) 0.05 % ophthalmic emulsion Place 1 drop into both eyes 2 (two) times daily.  . diphenoxylate-atropine (LOMOTIL) 2.5-0.025 MG tablet Take by mouth 4 (four) times daily as needed for diarrhea or loose stools.  . erlotinib (TARCEVA) 100 MG tablet Take 1 tablet (100 mg total) by mouth daily. Take on an empty stomach 1 hour before meals or 2 hours after  . Gemcitabine HCl (GEMZAR IV) Inject into the vein. Day 1,day 1 every 28 days  . lidocaine-prilocaine (EMLA) cream APPLY A QUARTER SIZE AMOUNT TO PORT SITE 1 HOURS PRIOR TO CHEMO DO NOT RUB IN COVER WITH PLASTIC WRAP  . megestrol (MEGACE ES) 625 MG/5ML suspension Take 625 mg by mouth daily.  . mirtazapine (REMERON) 7.5 MG tablet Take 1 tablet (7.5 mg total) by mouth at bedtime.  Marland Kitchen oxybutynin (DITROPAN-XL) 5 MG 24 hr tablet TK 1 T PO QD  . potassium chloride (K-DUR) 10 MEQ tablet Take 1 tablet (10 mEq total) by mouth 2 (two) times daily.  . traMADol (ULTRAM) 50 MG tablet One tablet once daily, as needed, for uncontrolled pain  . [DISCONTINUED] prochlorperazine (COMPAZINE) 10 MG tablet Take 1 tablet (10 mg total) by mouth every 6 (six) hours as needed (Nausea or vomiting).   Facility-Administered Encounter Medications as of 08/26/2017  Medication  . dexamethasone (DECADRON) 10 mg in sodium chloride 0.9 % 50 mL IVPB    ALLERGIES:  Allergies  Allergen Reactions  . Iohexol Hives and Shortness Of Breath    PATIENT HAD TO BE TAKEN TO ED, C/O WHELPS, HIVES, DIFFICULTY BREATHING. PATIENT GIVEN IV CONTRAST AFTER 13 HOUR PRE MEDS ON 01/09/2017 WITH NO REACTION NOTED  . Aciphex [Rabeprazole Sodium] Other (See Comments)    unknown  . Amlodipine Besylate-Valsartan     Rash to high-dose (5/320)  . Esomeprazole Magnesium   . Omeprazole Other (See  Comments)    Patient states "medication didn't work"  . Ciprofloxacin Rash  . Penicillins Swelling and Rash    Has patient had a PCN reaction causing immediate rash, facial/tongue/throat swelling, SOB or lightheadedness with hypotension: Yes Has patient had a PCN reaction causing severe rash involving mucus membranes or skin necrosis: Yes Has patient had a PCN reaction that required hospitalization: No Has patient had a PCN reaction occurring within the last 10 years: yes If all of the above answers are "NO", then may proceed with Cephalosporin use.      PHYSICAL EXAM:  ECOG Performance status: 1  Vitals:   08/26/17 0858  BP: (!) 169/72  Pulse: (!) 58  Resp: 16  Temp: 98.6 F (37 C)  SpO2: 100%   Filed Weights   08/26/17 0858  Weight: 134 lb 6.4 oz (61 kg)      LABORATORY DATA:  I have reviewed the labs as listed.  CBC    Component Value Date/Time   WBC 7.2 08/26/2017 0827   RBC 3.44 (L) 08/26/2017 0827   HGB 10.0 (L) 08/26/2017 0827   HCT 30.8 (L) 08/26/2017 0827   PLT 200 08/26/2017 0827   MCV 89.5 08/26/2017 0827   MCH 29.1 08/26/2017 0827   MCHC 32.5 08/26/2017 0827   RDW 19.0 (H) 08/26/2017 0827   LYMPHSABS  1.3 08/26/2017 0827   MONOABS 0.6 08/26/2017 0827   EOSABS 0.2 08/26/2017 0827   BASOSABS 0.0 08/26/2017 0827   CMP Latest Ref Rng & Units 08/26/2017 08/12/2017 08/05/2017  Glucose 65 - 99 mg/dL 142(H) 100(H) 134(H)  BUN 6 - 20 mg/dL 22(H) 25(H) 19  Creatinine 0.44 - 1.00 mg/dL 1.27(H) 1.34(H) 1.16(H)  Sodium 135 - 145 mmol/L 141 140 139  Potassium 3.5 - 5.1 mmol/L 3.7 4.2 3.9  Chloride 101 - 111 mmol/L 108 107 106  CO2 22 - 32 mmol/L '24 24 23  ' Calcium 8.9 - 10.3 mg/dL 8.4(L) 8.4(L) 8.3(L)  Total Protein 6.5 - 8.1 g/dL 6.6 6.6 6.3(L)  Total Bilirubin 0.3 - 1.2 mg/dL 0.7 1.0 0.8  Alkaline Phos 38 - 126 U/L 194(H) 165(H) 172(H)  AST 15 - 41 U/L '31 30 30  ' ALT 14 - 54 U/L 25 24 33       DIAGNOSTIC IMAGING:  I have independently reviewed her CT  scan from 07/13/2017 which shows stable liver disease and pancreatic mass.     ASSESSMENT & PLAN:   Pancreatic cancer metastasized to liver ([place]) 1.  Metastatic pancreatic cancer to the liver: -Foundation 1 testing shows MS and TMB cannot be determined, KRAS G12V, TP 53 - Gemcitabine and Abraxane from November 2015 through August 2018 with progression -Gemcitabine (day 1 and day 15) and Tarceva 100 mg daily q. 28 days started on 01/28/2017 -She had cycle 7 on 07/29/2017, and is tolerating very well.  CT scan done on 07/13/2017 shows stable liver disease.  Last CA 19-9 was on 06/24/2017 at 129.  She had diarrhea for 3-4 days and she attributes it to eating certain type of foods. -She may proceed with cycle 8 today without any dose modifications.  We will plan to repeat scans in 3-4 months.  I will also obtain another C in 19-9 level.  2.  Weight loss: She reports that she lost a few pounds after she had diarrhea for 4 days.  She started taking her sister's Megace 1 teaspoon daily.  She gained about 11 pounds in the last 2 weeks.  I have talked to her about the risks of taking Megace including thrombosis risk.      Orders placed this encounter:  Orders Placed This Encounter  Procedures  . CBC with Differential  . Comprehensive metabolic panel  . Cancer antigen 19-9      Derek Jack, MD [place] [phone]

## 2017-08-26 NOTE — Patient Instructions (Signed)
[facility] Discharge Instructions for Patients Receiving Chemotherapy   Beginning January 23rd 2017 lab work for the Cancer Center will be done in the  Main lab at [facility] on 1st floor. If you have a lab appointment with the Cancer Center please come in thru the  Main Entrance and check in at the main information desk   Today you received the following chemotherapy agents Gemzar. Follow-up as scheduled. Call clinic for any questions or concerns  To help prevent nausea and vomiting after your treatment, we encourage you to take your nausea medication   If you develop nausea and vomiting, or diarrhea that is not controlled by your medication, call the clinic.  The clinic phone number is [phone]. Office hours are Monday-Friday 8:30am-5:00pm.  BELOW ARE SYMPTOMS THAT SHOULD BE REPORTED IMMEDIATELY:  *FEVER GREATER THAN 101.0 F  *CHILLS WITH OR WITHOUT FEVER  NAUSEA AND VOMITING THAT IS NOT CONTROLLED WITH YOUR NAUSEA MEDICATION  *UNUSUAL SHORTNESS OF BREATH  *UNUSUAL BRUISING OR BLEEDING  TENDERNESS IN MOUTH AND THROAT WITH OR WITHOUT PRESENCE OF ULCERS  *URINARY PROBLEMS  *BOWEL PROBLEMS  UNUSUAL RASH Items with * indicate a potential emergency and should be followed up as soon as possible. If you have an emergency after office hours please contact your primary care physician or go to the nearest emergency department.  Please call the clinic during office hours if you have any questions or concerns.   You may also contact the Patient Navigator at [phone] should you have any questions or need assistance in obtaining follow up care.      Resources For Cancer Patients and their Caregivers ? American Cancer Society: Can assist with transportation, wigs, general needs, runs Look Good Feel Better.        1-888-227-6333 ? Cancer Care: Provides financial assistance, online support groups, medication/co-pay assistance.  1-800-813-HOPE  (4673) ? [facility] Assists [place] cancer patients and their families through emotional , educational and financial support.  336-427-4357 ? [place] DSS Where to apply for food stamps, Medicaid and utility assistance. 336-342-1394 ? [facility]: Transportation to medical appointments. 336-347-2287 ? Social Security Administration: May apply for disability if have a Stage IV cancer. 336-342-7796 1-800-772-1213 ? [place] Aging, Disability and Transit Services: Assists with nutrition, care and transit needs. 336-349-2343         

## 2017-09-09 ENCOUNTER — Encounter (HOSPITAL_COMMUNITY): Payer: Self-pay

## 2017-09-09 ENCOUNTER — Inpatient Hospital Stay (HOSPITAL_COMMUNITY): Payer: Medicare HMO

## 2017-09-09 ENCOUNTER — Other Ambulatory Visit: Payer: Self-pay

## 2017-09-09 ENCOUNTER — Other Ambulatory Visit: Payer: Self-pay | Admitting: Family Medicine

## 2017-09-09 ENCOUNTER — Inpatient Hospital Stay (HOSPITAL_COMMUNITY): Payer: Medicare HMO | Attending: Hematology

## 2017-09-09 VITALS — BP 176/55 | HR 63 | Temp 98.9°F | Resp 18 | Wt 131.3 lb

## 2017-09-09 DIAGNOSIS — C787 Secondary malignant neoplasm of liver and intrahepatic bile duct: Secondary | ICD-10-CM | POA: Insufficient documentation

## 2017-09-09 DIAGNOSIS — C259 Malignant neoplasm of pancreas, unspecified: Secondary | ICD-10-CM | POA: Diagnosis present

## 2017-09-09 DIAGNOSIS — Z5111 Encounter for antineoplastic chemotherapy: Secondary | ICD-10-CM | POA: Diagnosis not present

## 2017-09-09 LAB — CBC WITH DIFFERENTIAL/PLATELET
BASOS ABS: 0 10*3/uL (ref 0.0–0.1)
Basophils Relative: 0 %
Eosinophils Absolute: 0.1 10*3/uL (ref 0.0–0.7)
Eosinophils Relative: 2 %
HEMATOCRIT: 28.8 % — AB (ref 36.0–46.0)
Hemoglobin: 9.4 g/dL — ABNORMAL LOW (ref 12.0–15.0)
LYMPHS PCT: 13 %
Lymphs Abs: 0.9 10*3/uL (ref 0.7–4.0)
MCH: 29.3 pg (ref 26.0–34.0)
MCHC: 32.6 g/dL (ref 30.0–36.0)
MCV: 89.7 fL (ref 78.0–100.0)
MONO ABS: 0.8 10*3/uL (ref 0.1–1.0)
Monocytes Relative: 12 %
NEUTROS ABS: 5 10*3/uL (ref 1.7–7.7)
NEUTROS PCT: 73 %
Platelets: 193 10*3/uL (ref 150–400)
RBC: 3.21 MIL/uL — AB (ref 3.87–5.11)
RDW: 17.8 % — AB (ref 11.5–15.5)
WBC: 6.8 10*3/uL (ref 4.0–10.5)

## 2017-09-09 LAB — COMPREHENSIVE METABOLIC PANEL
ALT: 22 U/L (ref 14–54)
AST: 30 U/L (ref 15–41)
Albumin: 3.1 g/dL — ABNORMAL LOW (ref 3.5–5.0)
Alkaline Phosphatase: 192 U/L — ABNORMAL HIGH (ref 38–126)
Anion gap: 8 (ref 5–15)
BILIRUBIN TOTAL: 0.8 mg/dL (ref 0.3–1.2)
BUN: 23 mg/dL — AB (ref 6–20)
CHLORIDE: 108 mmol/L (ref 101–111)
CO2: 24 mmol/L (ref 22–32)
Calcium: 8.4 mg/dL — ABNORMAL LOW (ref 8.9–10.3)
Creatinine, Ser: 1.38 mg/dL — ABNORMAL HIGH (ref 0.44–1.00)
GFR calc Af Amer: 39 mL/min — ABNORMAL LOW (ref >60)
GFR, EST NON AFRICAN AMERICAN: 34 mL/min — AB (ref >60)
GLUCOSE: 107 mg/dL — AB (ref 65–99)
POTASSIUM: 4 mmol/L (ref 3.5–5.1)
Sodium: 140 mmol/L (ref 135–145)
TOTAL PROTEIN: 6.8 g/dL (ref 6.5–8.1)

## 2017-09-09 MED ORDER — PROCHLORPERAZINE MALEATE 10 MG PO TABS
10.0000 mg | ORAL_TABLET | Freq: Once | ORAL | Status: AC
Start: 2017-09-09 — End: 2017-09-09
  Administered 2017-09-09: 10 mg via ORAL

## 2017-09-09 MED ORDER — SODIUM CHLORIDE 0.9 % IV SOLN
800.0000 mg/m2 | Freq: Once | INTRAVENOUS | Status: AC
  Administered 2017-09-09: 1292 mg via INTRAVENOUS
  Filled 2017-09-09: qty 10.52

## 2017-09-09 MED ORDER — HEPARIN SOD (PORK) LOCK FLUSH 100 UNIT/ML IV SOLN
500.0000 [IU] | Freq: Once | INTRAVENOUS | Status: AC | PRN
  Administered 2017-09-09: 500 [IU]

## 2017-09-09 MED ORDER — PROCHLORPERAZINE MALEATE 10 MG PO TABS
ORAL_TABLET | ORAL | Status: AC
  Filled 2017-09-09: qty 1

## 2017-09-09 MED ORDER — SODIUM CHLORIDE 0.9 % IV SOLN
Freq: Once | INTRAVENOUS | Status: AC
  Administered 2017-09-09: 11:00:00 via INTRAVENOUS

## 2017-09-09 MED ORDER — SODIUM CHLORIDE 0.9% FLUSH
10.0000 mL | INTRAVENOUS | Status: DC | PRN
  Administered 2017-09-09: 10 mL
  Filled 2017-09-09: qty 10

## 2017-09-09 NOTE — Patient Instructions (Signed)
[facility] Discharge Instructions for Patients Receiving Chemotherapy   Beginning January 23rd 2017 lab work for the Cancer Center will be done in the  Main lab at [facility] on 1st floor. If you have a lab appointment with the Cancer Center please come in thru the  Main Entrance and check in at the main information desk   Today you received the following chemotherapy agents   To help prevent nausea and vomiting after your treatment, we encourage you to take your nausea medication     If you develop nausea and vomiting, or diarrhea that is not controlled by your medication, call the clinic.  The clinic phone number is [phone]. Office hours are Monday-Friday 8:30am-5:00pm.  BELOW ARE SYMPTOMS THAT SHOULD BE REPORTED IMMEDIATELY:  *FEVER GREATER THAN 101.0 F  *CHILLS WITH OR WITHOUT FEVER  NAUSEA AND VOMITING THAT IS NOT CONTROLLED WITH YOUR NAUSEA MEDICATION  *UNUSUAL SHORTNESS OF BREATH  *UNUSUAL BRUISING OR BLEEDING  TENDERNESS IN MOUTH AND THROAT WITH OR WITHOUT PRESENCE OF ULCERS  *URINARY PROBLEMS  *BOWEL PROBLEMS  UNUSUAL RASH Items with * indicate a potential emergency and should be followed up as soon as possible. If you have an emergency after office hours please contact your primary care physician or go to the nearest emergency department.  Please call the clinic during office hours if you have any questions or concerns.   You may also contact the Patient Navigator at [phone] should you have any questions or need assistance in obtaining follow up care.      Resources For Cancer Patients and their Caregivers ? American Cancer Society: Can assist with transportation, wigs, general needs, runs Look Good Feel Better.        1-888-227-6333 ? Cancer Care: Provides financial assistance, online support groups, medication/co-pay assistance.  1-800-813-HOPE (4673) ? [facility] Assists [place] cancer  patients and their families through emotional , educational and financial support.  336-427-4357 ? [place] DSS Where to apply for food stamps, Medicaid and utility assistance. 336-342-1394 ? [facility]: Transportation to medical appointments. 336-347-2287 ? Social Security Administration: May apply for disability if have a Stage IV cancer. 336-342-7796 1-800-772-1213 ? [place] Aging, Disability and Transit Services: Assists with nutrition, care and transit needs. 336-349-2343         

## 2017-09-09 NOTE — Progress Notes (Signed)
Treatment given per orders. Patient tolerated it well without problems. Vitals stable and discharged home from clinic ambulatory. Follow up as scheduled.  

## 2017-09-10 LAB — CANCER ANTIGEN 19-9: CA 19-9: 171 U/mL — ABNORMAL HIGH (ref 0–35)

## 2017-09-14 ENCOUNTER — Other Ambulatory Visit: Payer: Self-pay

## 2017-09-21 ENCOUNTER — Telehealth: Payer: Self-pay | Admitting: Family Medicine

## 2017-09-21 NOTE — Telephone Encounter (Signed)
Pt needs a refill on Tramadol.Marland Kitchenand is checking with the drug store on her heartburn meds

## 2017-09-21 NOTE — Telephone Encounter (Signed)
Pt is requesting "protonix" medication called in for her. Pt called and left voice mail with this request.

## 2017-09-21 NOTE — Telephone Encounter (Signed)
Unsure of the name of the reflux med she is referring to but she is going to have the pharmacy fax over the refill since I do not see any on her med list

## 2017-09-22 ENCOUNTER — Other Ambulatory Visit: Payer: Self-pay

## 2017-09-22 MED ORDER — PANTOPRAZOLE SODIUM 20 MG PO TBEC: DELAYED_RELEASE_TABLET | ORAL | 1 refills | Status: DC

## 2017-09-22 NOTE — Progress Notes (Signed)
[name], [place] [address]   CLINIC:  Medical Oncology/Hematology  PCP:  [name] Helper, MD [address] [place] [address] [phone]   REASON FOR VISIT:  Follow-up for metastatic pancreatic [name].  CURRENT THERAPY: Gemcitabine and Tarceva.  BRIEF ONCOLOGIC HISTORY:    Pancreatic [name] metastasized to liver ([place])   03/10/2014 Initial Diagnosis    Pancreatic [name] metastasized to liver      03/22/2014 - 03/05/2016 Chemotherapy    Abraxane/Gemzar days 1, 8, every 28 days.  Day 15 was cancelled due to leukopenia and thrombocytopenia on day 15 cycle 1.      05/24/2014 Treatment Plan Change    Day 8 of cycle 3 is held with ANC of 1.1      06/05/2014 Imaging    CT C/A/P . Interval decrease in size of the pancreatic tail mass.Improved hepatic metastatic disease. No new lesions. No CT findings for metastatic disease involving the chest.      08/09/2014 Tumor Marker    CA 19-9= 33 (WNL)      10/26/2014 Imaging    MRI- Continued interval decrease in size of the hepatic metastatic lesions and no new lesions are identified. Continued decrease in size of the pancreatic tail lesion.      01/03/2015 Tumor Marker    Results for [name], [name] (MRN [id number]) as of 01/18/2015 12:52  01/03/2015 10:00 CA 19-9: 14       01/17/2015 Imaging    MRI- Response to therapy of hepatic metastasis.  Similar size of a pancreatic tail lesion.      03/20/2015 Imaging    MRI-L spine- Severe disc space narrowing at L2-L3, with endplate reactive changes. Large disc extrusion into the ventral epidural space,central to the RIGHT with a cephalad migrated free fragment. Additional Large disc extrusion into the retroperitone...      03/22/2015 Imaging    CT pelvis- No evidence for metastatic disease within the pelvis.      07/24/2015 Imaging    MRI abd- Continued response to therapy, with no residual detectable liver metastases. No new  sites of metastatic disease in the abdomen.       08/15/2015 Code Status     She confirms desire for DNR status.      12/21/2015 Imaging    MRI liver- Severe image degradation due to motion artifact reducing diagnostic sensitivity and specificity. Reduce conspicuity of the pancreatic tail lesion suggesting further improvement. The original liver lesions have resolved.      03/05/2016 Treatment Plan Change    Chemotherapy holiday/Break after 2 years worth of treatment      04/07/2016 Imaging    CT chest- When compared to recent chest CT, new minimally displaced anterior left sixth rib fracture. Slight increase in subcarinal adenopathy      04/28/2016 Imaging    Bone density- BMD as determined from Femur Neck Right is 0.705 g/cm2 with a T-Score of -2.4. This patient is considered osteopenic according to [facility]) criteria. Compared with the prior study on 02/23/2013, the BMD of the lumbar spine/rt. femoral neck show a statistically significant decrease.       05/23/2016 Imaging    MRI abd- 1. Exam is significantly degraded by patient respiratory motion. Consider follow-up exams with CT abdomen with without contrast per pancreatic protocol. 2. Fullness in tail of pancreas is more prominent and potentially increased in size. Recommend close attention on follow-up. (Consider CT as above)  3. No explanation for back pain.      09/11/2016 Imaging    MRI pancreas- Interval progression of pancreatic tail lesion. New differential perfusion right liver with areas of heterogeneity. Underlying metastatic disease in this region not excluded.      09/11/2016 Progression    MRi in conjunction with rising CA 19-9 are indicative of relapse of disease.      01/09/2017 Progression    CT C/A/P: 2.1 x 2.9 cm lesion in the pancreatic tail and abutting the splenic hilum, corresponding to known primary pancreatic neoplasm, mildly increased.  Scattered small hypoenhancing  lesions in the liver measuring up to 10 mm, approximately 8-10 in number, suspicious for hepatic metastases.  No findings specific for metastatic disease in the chest.  Additional ancillary findings as above.       01/27/2017 -  Chemotherapy    The patient had gemcitabine (GEMZAR) 1,292 mg in sodium chloride 0.9 % 250 mL chemo infusion, 800 mg/m2 = 1,292 mg, Intravenous,  Once, 9 of 12 cycles Administration: 1,292 mg (01/28/2017), 1,292 mg (02/11/2017), 1,292 mg (02/25/2017), 1,292 mg (03/11/2017), 1,292 mg (03/25/2017), 1,292 mg (04/08/2017), 1,292 mg (04/22/2017), 1,292 mg (04/29/2017), 1,292 mg (05/20/2017), 1,292 mg (06/03/2017), 1,292 mg (06/24/2017), 1,292 mg (07/08/2017), 1,292 mg (07/29/2017), 1,292 mg (08/12/2017), 1,292 mg (08/26/2017), 1,292 mg (09/09/2017)  for chemotherapy treatment.         [name] STAGING: [name] Staging Pancreatic [name] metastasized to liver [facility]) Staging form: Pancreas, AJCC 7th Edition - Clinical: Stage IV (T3, N1, M1) - Signed by [name] Cancer, PA-C on 03/16/2014 - Pathologic: No stage assigned - Unsigned    INTERVAL HISTORY:  [name] 84 y.o. female returns for routine follow-up and consideration for next cycle of chemotherapy.   Reports occasional diarrhea; she takes Imodium PRN.  Denies any new pain. Reports that she has a kidney stone. Her appetite is "okay" at 75%.  She has lost some weight.  She would like to try another appetite stimulant; she stopped the Megace because of concerns for blood clots.   Due for next dose of Gemcitabine today.  Reports that she needs a refill on her Tarceva oral chemotherapy.        REVIEW OF SYSTEMS:  Review of Systems  Constitutional: Positive for unexpected weight change.  Gastrointestinal: Positive for diarrhea.  All other systems reviewed and are negative.    PAST MEDICAL/SURGICAL HISTORY:  Past Medical History:  Diagnosis Date  . Anemia due to antineoplastic chemotherapy 09/12/2015   Started  Aranesp 500 mcg on 09/12/2015  . Anxiety   . Chronic diarrhea   . Depression   . DNR (do not resuscitate) 08/16/2015  . Erosive esophagitis   . GERD (gastroesophageal reflux disease)   . Hx of adenomatous colonic polyps    tubular adenomas, last found in 2008  . Hyperplastic colon polyp 03/19/10   tcs by Dr. Gala Romney  . Hypertension 20 years   . Kidney stone    hx/ crushed   . Opioid contract exists 04/18/2015   With Dr. Moshe Cipro  . Pancreatic [name] ([place]) 02/2014  . Pancreatic [name] metastasized to liver ([place]) 03/10/2014  . Schatzki's ring 08/26/10   Last dilated on EGD by Dr. Trevor Iha [facility], linear gastric erosions, BI hemigastrectomy   Past Surgical History:  Procedure Laterality Date  . BREAST LUMPECTOMY Left   . bunion removal     from both feet   . CHOLECYSTECTOMY  1965   . COLONOSCOPY  03/19/2010   DR Gala Romney,,  normal TI, pancolonic diverticula, random colon bx neg., hyperplastic polyps removed  . ESOPHAGOGASTRODUODENOSCOPY  11/22/2003   DR Gala Romney, erosive RE, Billroth I  . ESOPHAGOGASTRODUODENOSCOPY  08/26/10   Dr. Gala Romney- moderate severe ERE, Scahtzki ring s/p dilation, Billroth I, linear gastric erosions, bx-gastric xanthelasma  . LEFT SHOULDER SURGERY  2009   DR HARRISON  . ORIF ANKLE FRACTURE Right 05/22/2013   Procedure: OPEN REDUCTION INTERNAL FIXATION (ORIF) RIGHT ANKLE FRACTURE;  Surgeon: Sanjuana Kava, MD;  Location: [facility];  Service: Orthopedics;  Laterality: Right;  . stomach ulcer  50 years ago    had some of her stomach removed      SOCIAL HISTORY:  Social History   Socioeconomic History  . Marital status: Widowed    Spouse name: Not on file  . Number of children: 2  . Years of education: Not on file  . Highest education level: Not on file  Occupational History  . Occupation: retired from Estate manager/land agent: RETIRED  Social Needs  . Financial resource strain: Not on file  . Food insecurity:    Worry: Not on file    Inability: Not on file  .  Transportation needs:    Medical: Not on file    Non-medical: Not on file  Tobacco Use  . Smoking status: Current Some Day Smoker    Packs/day: 0.50    Years: 60.00    Pack years: 30.00    Types: Cigarettes    Last attempt to quit: 09/09/2016    Years since quitting: 1.0  . Smokeless tobacco: Never Used  Substance and Sexual Activity  . Alcohol use: No  . Drug use: No  . Sexual activity: Never  Lifestyle  . Physical activity:    Days per week: Not on file    Minutes per session: Not on file  . Stress: Not on file  Relationships  . Social connections:    Talks on phone: Not on file    Gets together: Not on file    Attends religious service: Not on file    Active member of club or organization: Not on file    Attends meetings of clubs or organizations: Not on file    Relationship status: Not on file  . Intimate partner violence:    Fear of current or ex partner: Not on file    Emotionally abused: Not on file    Physically abused: Not on file    Forced sexual activity: Not on file  Other Topics Concern  . Not on file  Social History Narrative  . Not on file    FAMILY HISTORY:  Family History  Problem Relation Age of Onset  . Hypertension Mother   . Kidney failure Brother        on dialysis  . Diabetes Sister   . Diabetes Sister   . Hypertension Father   . Kidney failure Sister   . Kidney Stones Brother   . Breast [name] Son   . Gout Son   . Liver disease Neg Hx   . Colon [name] Neg Hx     CURRENT MEDICATIONS:  Outpatient Encounter Medications as of 09/23/2017  Medication Sig  . acetaminophen (TYLENOL) 500 MG tablet Take 500 mg by mouth every 6 (six) hours as needed for mild pain or moderate pain.   Marland Kitchen amLODipine (NORVASC) 10 MG tablet TAKE 1 TABLET BY MOUTH EVERY DAY  . Calcium-Phosphorus-Vitamin D [phone] MG-MG-UNIT CHEW Chew 2 tablets by mouth daily.  Marland Kitchen  clonazePAM (KLONOPIN) 0.5 MG tablet TAKE 1 TABLET BY MOUTH DAILY AS NEEDED FOR ANXIETY  . cycloSPORINE  (RESTASIS) 0.05 % ophthalmic emulsion Place 1 drop into both eyes 2 (two) times daily.  . diphenoxylate-atropine (LOMOTIL) 2.5-0.025 MG tablet Take by mouth 4 (four) times daily as needed for diarrhea or loose stools.  . erlotinib (TARCEVA) 100 MG tablet Take 1 tablet (100 mg total) by mouth daily. Take on an empty stomach 1 hour before meals or 2 hours after  . Gemcitabine HCl (GEMZAR IV) Inject into the vein. Day 1,day 1 every 28 days  . lidocaine-prilocaine (EMLA) cream APPLY A QUARTER SIZE AMOUNT TO PORT SITE 1 HOURS PRIOR TO CHEMO DO NOT RUB IN COVER WITH PLASTIC WRAP  . mirtazapine (REMERON) 7.5 MG tablet Take 1 tablet (7.5 mg total) by mouth at bedtime.  Marland Kitchen oxybutynin (DITROPAN-XL) 5 MG 24 hr tablet TK 1 T PO QD  . pantoprazole (PROTONIX) 20 MG tablet TAKE 1 TABLET(20 MG) BY MOUTH DAILY  . potassium chloride (K-DUR) 10 MEQ tablet Take 1 tablet (10 mEq total) by mouth 2 (two) times daily.  . traMADol (ULTRAM) 50 MG tablet One tablet once daily, as needed, for uncontrolled pain  . [DISCONTINUED] megestrol (MEGACE ES) 625 MG/5ML suspension Take 625 mg by mouth daily.  Marland Kitchen dronabinol (MARINOL) 2.5 MG capsule Take 1 capsule (2.5 mg total) by mouth 2 (two) times daily before a meal.  . [DISCONTINUED] prochlorperazine (COMPAZINE) 10 MG tablet Take 1 tablet (10 mg total) by mouth every 6 (six) hours as needed (Nausea or vomiting).   Facility-Administered Encounter Medications as of 09/23/2017  Medication  . dexamethasone (DECADRON) 10 mg in sodium chloride 0.9 % 50 mL IVPB    ALLERGIES:  Allergies  Allergen Reactions  . Iohexol Hives and Shortness Of Breath    PATIENT HAD TO BE TAKEN TO ED, C/O WHELPS, HIVES, DIFFICULTY BREATHING. PATIENT GIVEN IV CONTRAST AFTER 13 HOUR PRE MEDS ON 01/09/2017 WITH NO REACTION NOTED  . Aciphex [Rabeprazole Sodium] Other (See Comments)    unknown  . Amlodipine Besylate-Valsartan     Rash to high-dose (5/320)  . Esomeprazole Magnesium   . Omeprazole Other (See  Comments)    Patient states "medication didn't work"  . Ciprofloxacin Rash  . Penicillins Swelling and Rash    Has patient had a PCN reaction causing immediate rash, facial/tongue/throat swelling, SOB or lightheadedness with hypotension: Yes Has patient had a PCN reaction causing severe rash involving mucus membranes or skin necrosis: Yes Has patient had a PCN reaction that required hospitalization: No Has patient had a PCN reaction occurring within the last 10 years: yes If all of the above answers are "NO", then may proceed with Cephalosporin use.      PHYSICAL EXAM:  ECOG Performance status: 1  I have reviewed her vitals.   LABORATORY DATA:  I have reviewed the labs as listed.  CBC    Component Value Date/Time   WBC 7.1 09/23/2017 0841   RBC 3.37 (L) 09/23/2017 0841   HGB 10.0 (L) 09/23/2017 0841   HCT 29.9 (L) 09/23/2017 0841   PLT 183 09/23/2017 0841   MCV 88.7 09/23/2017 0841   MCH 29.7 09/23/2017 0841   MCHC 33.4 09/23/2017 0841   RDW 17.1 (H) 09/23/2017 0841   LYMPHSABS 1.4 09/23/2017 0841   MONOABS 0.6 09/23/2017 0841   EOSABS 0.2 09/23/2017 0841   BASOSABS 0.0 09/23/2017 0841   CMP Latest Ref Rng & Units 09/23/2017 09/09/2017 08/26/2017  Glucose  65 - 99 mg/dL 110(H) 107(H) 142(H)  BUN 6 - 20 mg/dL 20 23(H) 22(H)  Creatinine 0.44 - 1.00 mg/dL 1.33(H) 1.38(H) 1.27(H)  Sodium 135 - 145 mmol/L 138 140 141  Potassium 3.5 - 5.1 mmol/L 3.9 4.0 3.7  Chloride 101 - 111 mmol/L 107 108 108  CO2 22 - 32 mmol/L '25 24 24  ' Calcium 8.9 - 10.3 mg/dL 8.3(L) 8.4(L) 8.4(L)  Total Protein 6.5 - 8.1 g/dL 6.9 6.8 6.6  Total Bilirubin 0.3 - 1.2 mg/dL 0.9 0.8 0.7  Alkaline Phos 38 - 126 U/L 181(H) 192(H) 194(H)  AST 15 - 41 U/L '25 30 31  ' ALT 14 - 54 U/L '19 22 25       ' DIAGNOSTIC IMAGING:  I have independently reviewed her CT scan from 07/13/2017 which shows stable liver disease and pancreatic mass.     ASSESSMENT & PLAN:   Pancreatic [name] metastasized to liver ([place]) 1.   Metastatic pancreatic [name] to the liver: -Foundation 1 testing shows MS and TMB cannot be determined, KRAS G12V, TP 53 mutation - Gemcitabine and Abraxane from November 2015 through August 2018 with progression -Gemcitabine 800 mg/m (day 1 and day 15) and Tarceva 100 mg daily q. 28 days started on 01/28/2017 -CT scan done on 07/13/2017 shows stable liver disease. CA 19-9 was on 06/24/2017 at 129.  CA-19-9 at last visit went up to 171. -She will proceed with cycle 9-day 1 today.  She will continue Tarceva daily.  She had minor diarrhea this morning.  She takes Imodium as needed.  She will come back in 1 month for follow-up.  I will plan to repeat CT scans prior to next visit.  2.  Weight loss: She has lost about 5 to 7 pounds in the last 4 to 6 weeks.  She stopped taking her sister's Megace as I talked to her about thrombosis risk.  She does not have a good appetite.  We will start her on Marinol 2.5 mg twice daily and titrated up as needed.      Orders placed this encounter:  Orders Placed This Encounter  Procedures  . CT Chest W Contrast  . CT Abdomen Pelvis W Contrast  . Comprehensive metabolic panel  . CBC with Differential/Platelet  . [name] antigen 19-9    This note includes documentation from Mike Craze, NP, who was present during this patient's office visit and evaluation.  I have reviewed this note for its completeness and accuracy.  I have edited this note accordingly based on my findings and medical opinion.       Derek Jack, MD [place] [phone]

## 2017-09-22 NOTE — Telephone Encounter (Signed)
Med refilled and patient aware. 

## 2017-09-23 ENCOUNTER — Inpatient Hospital Stay (HOSPITAL_BASED_OUTPATIENT_CLINIC_OR_DEPARTMENT_OTHER): Payer: Medicare HMO | Admitting: Hematology

## 2017-09-23 ENCOUNTER — Inpatient Hospital Stay (HOSPITAL_COMMUNITY): Payer: Medicare HMO

## 2017-09-23 ENCOUNTER — Encounter (HOSPITAL_COMMUNITY): Payer: Self-pay | Admitting: Hematology

## 2017-09-23 ENCOUNTER — Other Ambulatory Visit: Payer: Self-pay

## 2017-09-23 ENCOUNTER — Encounter (HOSPITAL_COMMUNITY): Payer: Self-pay

## 2017-09-23 ENCOUNTER — Other Ambulatory Visit (HOSPITAL_COMMUNITY): Payer: Self-pay | Admitting: Emergency Medicine

## 2017-09-23 VITALS — BP 170/55 | HR 58 | Temp 98.9°F | Resp 16 | Wt 127.0 lb

## 2017-09-23 DIAGNOSIS — R634 Abnormal weight loss: Secondary | ICD-10-CM

## 2017-09-23 DIAGNOSIS — C259 Malignant neoplasm of pancreas, unspecified: Secondary | ICD-10-CM

## 2017-09-23 DIAGNOSIS — C787 Secondary malignant neoplasm of liver and intrahepatic bile duct: Secondary | ICD-10-CM | POA: Diagnosis not present

## 2017-09-23 DIAGNOSIS — R69 Illness, unspecified: Secondary | ICD-10-CM | POA: Diagnosis not present

## 2017-09-23 DIAGNOSIS — Z803 Family history of malignant neoplasm of breast: Secondary | ICD-10-CM

## 2017-09-23 DIAGNOSIS — F17210 Nicotine dependence, cigarettes, uncomplicated: Secondary | ICD-10-CM | POA: Diagnosis not present

## 2017-09-23 DIAGNOSIS — I10 Essential (primary) hypertension: Secondary | ICD-10-CM | POA: Diagnosis not present

## 2017-09-23 DIAGNOSIS — Z5111 Encounter for antineoplastic chemotherapy: Secondary | ICD-10-CM | POA: Diagnosis not present

## 2017-09-23 LAB — CBC WITH DIFFERENTIAL/PLATELET
BASOS ABS: 0 10*3/uL (ref 0.0–0.1)
BASOS PCT: 0 %
Eosinophils Absolute: 0.2 10*3/uL (ref 0.0–0.7)
Eosinophils Relative: 2 %
HEMATOCRIT: 29.9 % — AB (ref 36.0–46.0)
HEMOGLOBIN: 10 g/dL — AB (ref 12.0–15.0)
Lymphocytes Relative: 19 %
Lymphs Abs: 1.4 10*3/uL (ref 0.7–4.0)
MCH: 29.7 pg (ref 26.0–34.0)
MCHC: 33.4 g/dL (ref 30.0–36.0)
MCV: 88.7 fL (ref 78.0–100.0)
MONOS PCT: 8 %
Monocytes Absolute: 0.6 10*3/uL (ref 0.1–1.0)
NEUTROS ABS: 5 10*3/uL (ref 1.7–7.7)
NEUTROS PCT: 71 %
Platelets: 183 10*3/uL (ref 150–400)
RBC: 3.37 MIL/uL — ABNORMAL LOW (ref 3.87–5.11)
RDW: 17.1 % — ABNORMAL HIGH (ref 11.5–15.5)
WBC: 7.1 10*3/uL (ref 4.0–10.5)

## 2017-09-23 LAB — COMPREHENSIVE METABOLIC PANEL
ALBUMIN: 3.2 g/dL — AB (ref 3.5–5.0)
ALK PHOS: 181 U/L — AB (ref 38–126)
ALT: 19 U/L (ref 14–54)
AST: 25 U/L (ref 15–41)
Anion gap: 6 (ref 5–15)
BILIRUBIN TOTAL: 0.9 mg/dL (ref 0.3–1.2)
BUN: 20 mg/dL (ref 6–20)
CALCIUM: 8.3 mg/dL — AB (ref 8.9–10.3)
CO2: 25 mmol/L (ref 22–32)
CREATININE: 1.33 mg/dL — AB (ref 0.44–1.00)
Chloride: 107 mmol/L (ref 101–111)
GFR calc Af Amer: 41 mL/min — ABNORMAL LOW (ref >60)
GFR calc non Af Amer: 35 mL/min — ABNORMAL LOW (ref >60)
GLUCOSE: 110 mg/dL — AB (ref 65–99)
Potassium: 3.9 mmol/L (ref 3.5–5.1)
Sodium: 138 mmol/L (ref 135–145)
Total Protein: 6.9 g/dL (ref 6.5–8.1)

## 2017-09-23 LAB — LACTATE DEHYDROGENASE: LDH: 173 U/L (ref 98–192)

## 2017-09-23 MED ORDER — PROCHLORPERAZINE MALEATE 10 MG PO TABS
ORAL_TABLET | ORAL | Status: AC
  Filled 2017-09-23: qty 1

## 2017-09-23 MED ORDER — SODIUM CHLORIDE 0.9 % IV SOLN
800.0000 mg/m2 | Freq: Once | INTRAVENOUS | Status: AC
  Administered 2017-09-23: 1292 mg via INTRAVENOUS
  Filled 2017-09-23: qty 10.52

## 2017-09-23 MED ORDER — PROCHLORPERAZINE MALEATE 10 MG PO TABS
10.0000 mg | ORAL_TABLET | Freq: Once | ORAL | Status: AC
  Administered 2017-09-23: 10 mg via ORAL

## 2017-09-23 MED ORDER — DRONABINOL 2.5 MG PO CAPS: 2.5000 mg | ORAL_CAPSULE | Freq: Two times a day (BID) | ORAL | 0 refills | Status: DC

## 2017-09-23 MED ORDER — SODIUM CHLORIDE 0.9 % IV SOLN
Freq: Once | INTRAVENOUS | Status: AC
  Administered 2017-09-23: 11:00:00 via INTRAVENOUS

## 2017-09-23 MED ORDER — ERLOTINIB HCL 100 MG PO TABS: 100.0000 mg | ORAL_TABLET | Freq: Every day | ORAL | 1 refills | Status: DC

## 2017-09-23 MED ORDER — HEPARIN SOD (PORK) LOCK FLUSH 100 UNIT/ML IV SOLN
500.0000 [IU] | Freq: Once | INTRAVENOUS | Status: AC | PRN
  Administered 2017-09-23: 500 [IU]

## 2017-09-23 MED ORDER — SODIUM CHLORIDE 0.9% FLUSH
10.0000 mL | INTRAVENOUS | Status: DC | PRN
  Administered 2017-09-23: 10 mL
  Filled 2017-09-23: qty 10

## 2017-09-23 NOTE — Assessment & Plan Note (Signed)
1.  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 28 days started on 01/28/2017 -CT scan done on 07/13/2017 shows stable liver disease. CA 19-9 was on 06/24/2017 at 129.  CA-19-9 at last visit went up to 171. -She will proceed with cycle 9-day 1 today.  She will continue Tarceva daily.  She had minor diarrhea this morning.  She takes Imodium as needed.  She will come back in 1 month for follow-up.  I will plan to repeat CT scans prior to next visit.  2.  Weight loss: She has lost about 5 to 7 pounds in the last 4 to 6 weeks.  She stopped taking her sister's Megace as I talked to her about thrombosis risk.  She does not have a good appetite.  We will start her on Marinol 2.5 mg twice daily and titrated up as needed.

## 2017-09-23 NOTE — Progress Notes (Signed)
Patient to treatment room for oncology follow up and chemotherapy.  Patient on Tarceva with prn diarrhea that is managed with imodium when needed.  Denied rashes or itching.  Fatigue but no worsening.  No other complaints voiced.    Patient tolerated chemotherapy with no complaints voiced.  Port site clean and dry with no bruising or swelling noted at site.  Good blood return noted before and after administration.  Band aid applied.  VSs with discharge and left ambulatory with no s/s of distress noted.

## 2017-09-23 NOTE — Progress Notes (Signed)
tarceva refilled 

## 2017-09-23 NOTE — Patient Instructions (Signed)
[facility] Discharge Instructions for Patients Receiving Chemotherapy  Today you received the following chemotherapy agents gemzar    If you develop nausea and vomiting that is not controlled by your nausea medication, call the clinic.   BELOW ARE SYMPTOMS THAT SHOULD BE REPORTED IMMEDIATELY:  *FEVER GREATER THAN 100.5 F  *CHILLS WITH OR WITHOUT FEVER  NAUSEA AND VOMITING THAT IS NOT CONTROLLED WITH YOUR NAUSEA MEDICATION  *UNUSUAL SHORTNESS OF BREATH  *UNUSUAL BRUISING OR BLEEDING  TENDERNESS IN MOUTH AND THROAT WITH OR WITHOUT PRESENCE OF ULCERS  *URINARY PROBLEMS  *BOWEL PROBLEMS  UNUSUAL RASH Items with * indicate a potential emergency and should be followed up as soon as possible.  Feel free to call the clinic should you have any questions or concerns. The clinic phone number is [phone].  Please show the [place] at check-in to the Emergency Department and triage nurse.

## 2017-09-24 ENCOUNTER — Telehealth: Payer: Self-pay | Admitting: Family Medicine

## 2017-09-24 NOTE — Telephone Encounter (Signed)
Patient lvm asking for clarification about her tramadol refill, Velna Hatchet says she sent it in.   I called patient and let her know.

## 2017-09-24 NOTE — Telephone Encounter (Signed)
Med refilled.

## 2017-09-24 NOTE — Telephone Encounter (Signed)
Patient calling in to request tramadol for kidney stone pain Going out of town tomorrow through Monday for grandsons graduation. She is aware Dr.Simpson is out of town. Cb#: 336/ [phone]

## 2017-09-25 ENCOUNTER — Telehealth (HOSPITAL_COMMUNITY): Payer: Self-pay

## 2017-09-25 NOTE — Telephone Encounter (Signed)
Nutrition  Patient identified on Malnutrition screening report for poor appetite and weight loss.  Called home number with no answer or option to leave message.  Called mobile number and voicemail box has not been set up.   Please refer patient to RD if can be of assistance.  [name], [place], [place] Registered Dietitian [phone] (pager)

## 2017-10-02 ENCOUNTER — Ambulatory Visit: Payer: Medicare HMO | Admitting: Urology

## 2017-10-02 DIAGNOSIS — N3946 Mixed incontinence: Secondary | ICD-10-CM | POA: Diagnosis not present

## 2017-10-02 DIAGNOSIS — F981 Encopresis not due to a substance or known physiological condition: Secondary | ICD-10-CM

## 2017-10-02 DIAGNOSIS — R69 Illness, unspecified: Secondary | ICD-10-CM | POA: Diagnosis not present

## 2017-10-07 ENCOUNTER — Encounter (HOSPITAL_COMMUNITY): Payer: Self-pay

## 2017-10-07 ENCOUNTER — Inpatient Hospital Stay (HOSPITAL_COMMUNITY): Payer: Medicare HMO

## 2017-10-07 VITALS — BP 195/70 | HR 57 | Temp 97.8°F | Resp 18 | Wt 124.8 lb

## 2017-10-07 DIAGNOSIS — C259 Malignant neoplasm of pancreas, unspecified: Secondary | ICD-10-CM | POA: Diagnosis not present

## 2017-10-07 DIAGNOSIS — C787 Secondary malignant neoplasm of liver and intrahepatic bile duct: Secondary | ICD-10-CM

## 2017-10-07 DIAGNOSIS — Z5111 Encounter for antineoplastic chemotherapy: Secondary | ICD-10-CM | POA: Diagnosis not present

## 2017-10-07 LAB — CBC WITH DIFFERENTIAL/PLATELET
BASOS ABS: 0 10*3/uL (ref 0.0–0.1)
Basophils Relative: 0 %
Eosinophils Absolute: 0.2 10*3/uL (ref 0.0–0.7)
Eosinophils Relative: 3 %
HCT: 28.9 % — ABNORMAL LOW (ref 36.0–46.0)
HEMOGLOBIN: 9.5 g/dL — AB (ref 12.0–15.0)
LYMPHS ABS: 1.3 10*3/uL (ref 0.7–4.0)
LYMPHS PCT: 22 %
MCH: 29.2 pg (ref 26.0–34.0)
MCHC: 32.9 g/dL (ref 30.0–36.0)
MCV: 88.9 fL (ref 78.0–100.0)
Monocytes Absolute: 0.7 10*3/uL (ref 0.1–1.0)
Monocytes Relative: 13 %
NEUTROS PCT: 62 %
Neutro Abs: 3.6 10*3/uL (ref 1.7–7.7)
PLATELETS: 172 10*3/uL (ref 150–400)
RBC: 3.25 MIL/uL — AB (ref 3.87–5.11)
RDW: 16.7 % — ABNORMAL HIGH (ref 11.5–15.5)
WBC: 5.8 10*3/uL (ref 4.0–10.5)

## 2017-10-07 LAB — COMPREHENSIVE METABOLIC PANEL
ALBUMIN: 3.1 g/dL — AB (ref 3.5–5.0)
ALT: 14 U/L (ref 14–54)
ANION GAP: 9 (ref 5–15)
AST: 23 U/L (ref 15–41)
Alkaline Phosphatase: 143 U/L — ABNORMAL HIGH (ref 38–126)
BUN: 27 mg/dL — ABNORMAL HIGH (ref 6–20)
CALCIUM: 8.3 mg/dL — AB (ref 8.9–10.3)
CO2: 28 mmol/L (ref 22–32)
Chloride: 101 mmol/L (ref 101–111)
Creatinine, Ser: 1.68 mg/dL — ABNORMAL HIGH (ref 0.44–1.00)
GFR calc non Af Amer: 26 mL/min — ABNORMAL LOW (ref >60)
GFR, EST AFRICAN AMERICAN: 31 mL/min — AB (ref >60)
GLUCOSE: 96 mg/dL (ref 65–99)
POTASSIUM: 3.8 mmol/L (ref 3.5–5.1)
SODIUM: 138 mmol/L (ref 135–145)
Total Bilirubin: 0.5 mg/dL (ref 0.3–1.2)
Total Protein: 7 g/dL (ref 6.5–8.1)

## 2017-10-07 MED ORDER — PROCHLORPERAZINE MALEATE 10 MG PO TABS
10.0000 mg | ORAL_TABLET | Freq: Once | ORAL | Status: AC
  Administered 2017-10-07: 10 mg via ORAL
  Filled 2017-10-07: qty 1

## 2017-10-07 MED ORDER — HEPARIN SOD (PORK) LOCK FLUSH 100 UNIT/ML IV SOLN
500.0000 [IU] | Freq: Once | INTRAVENOUS | Status: AC | PRN
  Administered 2017-10-07: 500 [IU]
  Filled 2017-10-07: qty 5

## 2017-10-07 MED ORDER — SODIUM CHLORIDE 0.9 % IV SOLN
INTRAVENOUS | Status: DC
  Administered 2017-10-07: 10:00:00 via INTRAVENOUS

## 2017-10-07 MED ORDER — SODIUM CHLORIDE 0.9 % IV SOLN
Freq: Once | INTRAVENOUS | Status: AC
  Administered 2017-10-07: 11:00:00 via INTRAVENOUS

## 2017-10-07 MED ORDER — SODIUM CHLORIDE 0.9 % IV SOLN
800.0000 mg/m2 | Freq: Once | INTRAVENOUS | Status: AC
  Administered 2017-10-07: 1292 mg via INTRAVENOUS
  Filled 2017-10-07: qty 23.46

## 2017-10-07 MED ORDER — SODIUM CHLORIDE 0.9% FLUSH
10.0000 mL | INTRAVENOUS | Status: DC | PRN
  Administered 2017-10-07: 10 mL
  Filled 2017-10-07: qty 10

## 2017-10-07 NOTE — Progress Notes (Signed)
Labs reviewed with Dr. Delton Coombes and pt approved for Gemzar infusion today with 500 ml NS over 1 hr ordered per MD for elevated BUN and Creatinine          [name] tolerated Gemzar infusion and hydration well without complaints or incident. Pt continues to take her Tarceva as prescribed without any issuesVSS upon discharge. Pt discharged self ambulatory in satisfactory condition accompanied by family member

## 2017-10-07 NOTE — Patient Instructions (Signed)
[facility] Discharge Instructions for Patients Receiving Chemotherapy   Beginning January 23rd 2017 lab work for the Cancer Center will be done in the  Main lab at  on 1st floor. If you have a lab appointment with the Cancer Center please come in thru the  Main Entrance and check in at the main information desk   Today you received the following chemotherapy agents Gemzar. Follow-up as scheduled. Call clinic for any questions or concerns  To help prevent nausea and vomiting after your treatment, we encourage you to take your nausea medication   If you develop nausea and vomiting, or diarrhea that is not controlled by your medication, call the clinic.  The clinic phone number is [phone]. Office hours are Monday-Friday 8:30am-5:00pm.  BELOW ARE SYMPTOMS THAT SHOULD BE REPORTED IMMEDIATELY:  *FEVER GREATER THAN 101.0 F  *CHILLS WITH OR WITHOUT FEVER  NAUSEA AND VOMITING THAT IS NOT CONTROLLED WITH YOUR NAUSEA MEDICATION  *UNUSUAL SHORTNESS OF BREATH  *UNUSUAL BRUISING OR BLEEDING  TENDERNESS IN MOUTH AND THROAT WITH OR WITHOUT PRESENCE OF ULCERS  *URINARY PROBLEMS  *BOWEL PROBLEMS  UNUSUAL RASH Items with * indicate a potential emergency and should be followed up as soon as possible. If you have an emergency after office hours please contact your primary care physician or go to the nearest emergency department.  Please call the clinic during office hours if you have any questions or concerns.   You may also contact the Patient Navigator at [phone] should you have any questions or need assistance in obtaining follow up care.      Resources For Cancer Patients and their Caregivers ? American Cancer Society: Can assist with transportation, wigs, general needs, runs Look Good Feel Better.        1-888-227-6333 ? Cancer Care: Provides financial assistance, online support groups, medication/co-pay assistance.  1-800-813-HOPE  (4673) ? [facility] Assists [place] cancer patients and their families through emotional , educational and financial support.  336-427-4357 ? [place] DSS Where to apply for food stamps, Medicaid and utility assistance. 336-342-1394 ? [facility]: Transportation to medical appointments. 336-347-2287 ? Social Security Administration: May apply for disability if have a Stage IV cancer. 336-342-7796 1-800-772-1213 ? [place] Aging, Disability and Transit Services: Assists with nutrition, care and transit needs. 336-349-2343         

## 2017-10-15 NOTE — Progress Notes (Signed)
1430  I spoke with Sharyn Lull RN concerning Mrs Kenan premedication. For her CT Monday 10/19/17.  Sharyn Lull with call pharmacy and contact patient.   Mrs Rogstad had called CT with questions concerning her pre medication.

## 2017-10-16 ENCOUNTER — Telehealth (HOSPITAL_COMMUNITY): Payer: Self-pay

## 2017-10-16 DIAGNOSIS — C259 Malignant neoplasm of pancreas, unspecified: Secondary | ICD-10-CM

## 2017-10-16 DIAGNOSIS — Z91041 Radiographic dye allergy status: Secondary | ICD-10-CM

## 2017-10-16 DIAGNOSIS — C787 Secondary malignant neoplasm of liver and intrahepatic bile duct: Principal | ICD-10-CM

## 2017-10-16 MED ORDER — DIPHENHYDRAMINE HCL 50 MG PO TABS: ORAL_TABLET | ORAL | 0 refills | Status: DC

## 2017-10-16 MED ORDER — PREDNISONE 50 MG PO TABS: ORAL_TABLET | ORAL | 0 refills | Status: DC

## 2017-10-16 NOTE — Telephone Encounter (Signed)
Received call from [facility] in [place]. Patient had called her stating she needs premeds because she is allergic to contrast. Reviewed with provider. Premeds ordered and prescription given to patient with instructions listed on when to take the meds. Patient verbalized understanding.

## 2017-10-18 DIAGNOSIS — L400 Psoriasis vulgaris: Secondary | ICD-10-CM | POA: Diagnosis not present

## 2017-10-19 ENCOUNTER — Ambulatory Visit (HOSPITAL_COMMUNITY)
Admission: RE | Admit: 2017-10-19 | Discharge: 2017-10-19 | Disposition: A | Discharge status: Home / Self Care | Payer: Medicare HMO | Source: Ambulatory Visit | Attending: Hematology | Admitting: Hematology

## 2017-10-19 ENCOUNTER — Other Ambulatory Visit (HOSPITAL_COMMUNITY): Payer: Self-pay

## 2017-10-19 ENCOUNTER — Encounter (HOSPITAL_COMMUNITY): Payer: Self-pay | Admitting: Emergency Medicine

## 2017-10-19 ENCOUNTER — Telehealth: Payer: Self-pay | Admitting: Family Medicine

## 2017-10-19 ENCOUNTER — Emergency Department (HOSPITAL_COMMUNITY)
Admission: EM | Admit: 2017-10-19 | Discharge: 2017-10-19 | Disposition: A | Discharge status: Home / Self Care | Payer: Medicare HMO | Attending: Emergency Medicine | Admitting: Emergency Medicine

## 2017-10-19 DIAGNOSIS — K7689 Other specified diseases of liver: Secondary | ICD-10-CM | POA: Insufficient documentation

## 2017-10-19 DIAGNOSIS — N2889 Other specified disorders of kidney and ureter: Secondary | ICD-10-CM | POA: Insufficient documentation

## 2017-10-19 DIAGNOSIS — T7840XA Allergy, unspecified, initial encounter: Secondary | ICD-10-CM | POA: Insufficient documentation

## 2017-10-19 DIAGNOSIS — C259 Malignant neoplasm of pancreas, unspecified: Secondary | ICD-10-CM | POA: Insufficient documentation

## 2017-10-19 DIAGNOSIS — C787 Secondary malignant neoplasm of liver and intrahepatic bile duct: Principal | ICD-10-CM

## 2017-10-19 DIAGNOSIS — I864 Gastric varices: Secondary | ICD-10-CM

## 2017-10-19 DIAGNOSIS — N200 Calculus of kidney: Secondary | ICD-10-CM | POA: Insufficient documentation

## 2017-10-19 DIAGNOSIS — D734 Cyst of spleen: Secondary | ICD-10-CM | POA: Insufficient documentation

## 2017-10-19 DIAGNOSIS — Z5321 Procedure and treatment not carried out due to patient leaving prior to being seen by health care provider: Secondary | ICD-10-CM | POA: Diagnosis not present

## 2017-10-19 DIAGNOSIS — I700 Atherosclerosis of aorta: Secondary | ICD-10-CM | POA: Insufficient documentation

## 2017-10-19 DIAGNOSIS — I2510 Atherosclerotic heart disease of native coronary artery without angina pectoris: Secondary | ICD-10-CM

## 2017-10-19 DIAGNOSIS — J432 Centrilobular emphysema: Secondary | ICD-10-CM

## 2017-10-19 DIAGNOSIS — N858 Other specified noninflammatory disorders of uterus: Secondary | ICD-10-CM | POA: Insufficient documentation

## 2017-10-19 DIAGNOSIS — Z5111 Encounter for antineoplastic chemotherapy: Secondary | ICD-10-CM | POA: Diagnosis not present

## 2017-10-19 DIAGNOSIS — L509 Urticaria, unspecified: Secondary | ICD-10-CM

## 2017-10-19 MED ORDER — DIPHENHYDRAMINE HCL 25 MG PO CAPS
ORAL_CAPSULE | ORAL | Status: AC
  Administered 2017-10-19: 25 mg via ORAL
  Filled 2017-10-19: qty 1

## 2017-10-19 MED ORDER — DIPHENHYDRAMINE HCL 25 MG PO CAPS
25.0000 mg | ORAL_CAPSULE | Freq: Once | ORAL | Status: AC
  Administered 2017-10-19: 25 mg via ORAL

## 2017-10-19 MED ORDER — IOPAMIDOL (ISOVUE-300) INJECTION 61%
100.0000 mL | Freq: Once | INTRAVENOUS | Status: AC | PRN
  Administered 2017-10-19: 100 mL via INTRAVENOUS

## 2017-10-19 NOTE — ED Notes (Signed)
Pt notified registration she would be leaving at this time.

## 2017-10-19 NOTE — ED Triage Notes (Signed)
Patient states she came for outpatient CT at 1000 this morning and had allergic reaction. States they gave her benadryl and the swelling has went down but her B/P at the time was 198/71. Complaining of lightheadedness and headache at triage.

## 2017-10-19 NOTE — Telephone Encounter (Signed)
Pt is calling back in --she does not feel good--I advised to go to the ER.

## 2017-10-19 NOTE — Progress Notes (Signed)
Pt ok to go per MD. Denies itching and welts have decreased. Denies shortness of breath.

## 2017-10-19 NOTE — Progress Notes (Signed)
Pt c/o welts and itching on torso. MD aware, vitals taken as documented and benadryl given per MD.

## 2017-10-19 NOTE — Telephone Encounter (Signed)
Pt had a CT scan today at [facility]. And had a reaction they gave her Benadryl She has hives on her face, arms, back, had to have 2 benadryls.  Swelling was coming down, but BP was HIGH... Forestine Na told her to come see you.  BP was 200 over 80  BP was 198 over 71  She has never had a issue before with BP per the Patient. She feels ok, no headache or vision issues.

## 2017-10-19 NOTE — Telephone Encounter (Signed)
Work in tomorrow pm

## 2017-10-20 ENCOUNTER — Telehealth: Payer: Self-pay | Admitting: Family Medicine

## 2017-10-20 NOTE — Telephone Encounter (Signed)
Patient left a message that she was directed to go to the ER yesterday due to high BP. She states she waited over 3 hours without being seen so she went home. I scheduled her an appt for the next available. Okay per [employer]

## 2017-10-21 ENCOUNTER — Inpatient Hospital Stay (HOSPITAL_BASED_OUTPATIENT_CLINIC_OR_DEPARTMENT_OTHER): Payer: Medicare HMO | Admitting: Hematology

## 2017-10-21 ENCOUNTER — Inpatient Hospital Stay (HOSPITAL_COMMUNITY): Payer: Medicare HMO

## 2017-10-21 ENCOUNTER — Inpatient Hospital Stay (HOSPITAL_COMMUNITY): Payer: Medicare HMO | Attending: Hematology

## 2017-10-21 ENCOUNTER — Encounter (HOSPITAL_COMMUNITY): Payer: Self-pay | Admitting: Hematology

## 2017-10-21 ENCOUNTER — Encounter (HOSPITAL_COMMUNITY): Payer: Self-pay

## 2017-10-21 ENCOUNTER — Other Ambulatory Visit: Payer: Self-pay

## 2017-10-21 VITALS — BP 183/64 | HR 70 | Temp 98.6°F | Resp 16 | Wt 126.6 lb

## 2017-10-21 DIAGNOSIS — C259 Malignant neoplasm of pancreas, unspecified: Secondary | ICD-10-CM

## 2017-10-21 DIAGNOSIS — I10 Essential (primary) hypertension: Secondary | ICD-10-CM | POA: Diagnosis not present

## 2017-10-21 DIAGNOSIS — R634 Abnormal weight loss: Secondary | ICD-10-CM | POA: Diagnosis not present

## 2017-10-21 DIAGNOSIS — F17210 Nicotine dependence, cigarettes, uncomplicated: Secondary | ICD-10-CM

## 2017-10-21 DIAGNOSIS — C787 Secondary malignant neoplasm of liver and intrahepatic bile duct: Secondary | ICD-10-CM | POA: Diagnosis not present

## 2017-10-21 DIAGNOSIS — R69 Illness, unspecified: Secondary | ICD-10-CM | POA: Diagnosis not present

## 2017-10-21 DIAGNOSIS — L400 Psoriasis vulgaris: Secondary | ICD-10-CM | POA: Diagnosis not present

## 2017-10-21 DIAGNOSIS — Z5111 Encounter for antineoplastic chemotherapy: Secondary | ICD-10-CM | POA: Insufficient documentation

## 2017-10-21 LAB — CBC WITH DIFFERENTIAL/PLATELET
Basophils Absolute: 0 10*3/uL (ref 0.0–0.1)
Basophils Relative: 0 %
Eosinophils Absolute: 0.2 10*3/uL (ref 0.0–0.7)
Eosinophils Relative: 2 %
HEMATOCRIT: 31.7 % — AB (ref 36.0–46.0)
HEMOGLOBIN: 10.3 g/dL — AB (ref 12.0–15.0)
LYMPHS ABS: 1.9 10*3/uL (ref 0.7–4.0)
LYMPHS PCT: 20 %
MCH: 29.1 pg (ref 26.0–34.0)
MCHC: 32.5 g/dL (ref 30.0–36.0)
MCV: 89.5 fL (ref 78.0–100.0)
MONOS PCT: 7 %
Monocytes Absolute: 0.7 10*3/uL (ref 0.1–1.0)
NEUTROS ABS: 6.4 10*3/uL (ref 1.7–7.7)
Neutrophils Relative %: 71 %
Platelets: 208 10*3/uL (ref 150–400)
RBC: 3.54 MIL/uL — ABNORMAL LOW (ref 3.87–5.11)
RDW: 17.1 % — ABNORMAL HIGH (ref 11.5–15.5)
WBC: 9.1 10*3/uL (ref 4.0–10.5)

## 2017-10-21 LAB — COMPREHENSIVE METABOLIC PANEL
ALK PHOS: 143 U/L — AB (ref 38–126)
ALT: 17 U/L (ref 14–54)
ANION GAP: 7 (ref 5–15)
AST: 25 U/L (ref 15–41)
Albumin: 3.3 g/dL — ABNORMAL LOW (ref 3.5–5.0)
BILIRUBIN TOTAL: 0.7 mg/dL (ref 0.3–1.2)
BUN: 25 mg/dL — ABNORMAL HIGH (ref 6–20)
CALCIUM: 8.5 mg/dL — AB (ref 8.9–10.3)
CO2: 29 mmol/L (ref 22–32)
CREATININE: 1.35 mg/dL — AB (ref 0.44–1.00)
Chloride: 104 mmol/L (ref 101–111)
GFR calc Af Amer: 40 mL/min — ABNORMAL LOW (ref >60)
GFR calc non Af Amer: 34 mL/min — ABNORMAL LOW (ref >60)
Glucose, Bld: 110 mg/dL — ABNORMAL HIGH (ref 65–99)
Potassium: 3.8 mmol/L (ref 3.5–5.1)
Sodium: 140 mmol/L (ref 135–145)
TOTAL PROTEIN: 7.4 g/dL (ref 6.5–8.1)

## 2017-10-21 MED ORDER — SODIUM CHLORIDE 0.9% FLUSH
10.0000 mL | INTRAVENOUS | Status: DC | PRN
  Administered 2017-10-21: 10 mL
  Filled 2017-10-21: qty 10

## 2017-10-21 MED ORDER — DEXAMETHASONE SODIUM PHOSPHATE 10 MG/ML IJ SOLN
10.0000 mg | Freq: Once | INTRAMUSCULAR | Status: AC
  Administered 2017-10-21: 10 mg via INTRAVENOUS

## 2017-10-21 MED ORDER — DEXAMETHASONE SODIUM PHOSPHATE 10 MG/ML IJ SOLN
INTRAMUSCULAR | Status: AC
  Filled 2017-10-21: qty 1

## 2017-10-21 MED ORDER — SODIUM CHLORIDE 0.9 % IV SOLN
50.0000 mg/m2 | Freq: Once | INTRAVENOUS | Status: AC
  Administered 2017-10-21: 81.7 mg via INTRAVENOUS
  Filled 2017-10-21: qty 19

## 2017-10-21 MED ORDER — LEUCOVORIN CALCIUM INJECTION 350 MG
700.0000 mg | Freq: Once | INTRAMUSCULAR | Status: AC
  Administered 2017-10-21: 700 mg via INTRAVENOUS
  Filled 2017-10-21: qty 35

## 2017-10-21 MED ORDER — SODIUM CHLORIDE 0.9 % IV SOLN
Freq: Once | INTRAVENOUS | Status: AC
  Administered 2017-10-21: 13:00:00 via INTRAVENOUS

## 2017-10-21 MED ORDER — SODIUM CHLORIDE 0.9 % IV SOLN
2400.0000 mg/m2 | INTRAVENOUS | Status: DC
  Administered 2017-10-21: 3850 mg via INTRAVENOUS
  Filled 2017-10-21: qty 77

## 2017-10-21 MED ORDER — PROCHLORPERAZINE MALEATE 10 MG PO TABS
10.0000 mg | ORAL_TABLET | Freq: Once | ORAL | Status: AC
  Administered 2017-10-21: 10 mg via ORAL
  Filled 2017-10-21: qty 1

## 2017-10-21 NOTE — Progress Notes (Signed)
DISCONTINUE OFF PATHWAY REGIMEN - Pancreatic   Custom Intervention:Nab-Paclitaxel (Abraxane(R)) 125 mg/m2 D1 & 8 + Gemcitabine 1,000 mg/m2 D1 & 8 q28 Days:     Nab-paclitaxel (protein bound)      Gemcitabine   **Always confirm dose/schedule in your pharmacy ordering system**  REASON: Disease Progression PRIOR TREATMENT: Off Pathway: Nab-Paclitaxel (Abraxane(R)) 125 mg/m2 D1 & 8 + Gemcitabine 1,000 mg/m2 D1 & 8 q28 Days TREATMENT RESPONSE: Progressive Disease (PD)  START ON PATHWAY REGIMEN - Pancreatic     A cycle is every 14 days:     Liposomal irinotecan      Leucovorin      5-Fluorouracil   **Always confirm dose/schedule in your pharmacy ordering system**  Patient Characteristics: Adenocarcinoma, Metastatic Disease, Second Line, MSS/pMMR or MSI Unknown, If Gemcitabine/Nab-Paclitaxel (Abraxane(R)) or If Gemcitabine First Line Histology: Adenocarcinoma Current evidence of distant metastases<= Yes AJCC T Category: T3 AJCC N Category: N1 AJCC M Category: M1 AJCC 8 Stage Grouping: IV Line of Therapy: Second Line Microsatellite/Mismatch Repair Status: Unknown Intent of Therapy: Non-Curative / Palliative Intent, Discussed with Patient

## 2017-10-21 NOTE — Assessment & Plan Note (Addendum)
1.  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 28 days started on 01/28/2017 -CT scan done on 07/13/2017 shows stable liver disease. CA 19-9 was on 06/24/2017 at 129.  CA-19-9 at last visit went up to 171. - I discussed the latest CT scan reports which showed progression in the liver lesions which are very subtle.  There is progression in the tail of the pancreas lesion measuring 27 x 27 mm.  Even though the progression is very small, her CA 19-9 is continuing to go up, last one at 171.  Hence I have recommended a change in her treatment.  We will discontinue gemcitabine.  I have told her to stop taking Tarceva.  We talked about starting her on 5-FU along with liposomal Irinotecan.  We discussed the side effects in detail.  She is agreeable to this plan.  We plan to rescan her after 2 to 3 months of treatment.  I would start her on liposomal Irinotecan at a decreased dose of 50 mg/m2 and increase it to full dose at 70 mg/m2 if she tolerates it well.  Questions were encouraged and answered to her satisfaction. -We will consider testing BRCA1/2 and PALB2 mutation.  We will also consider testing MSI.  2.  Weight loss: She initially lost 67 pounds.  I have started her on Marinol twice daily at 2.5 mg.  She has gained couple of pounds since last visit.

## 2017-10-21 NOTE — Progress Notes (Signed)
[place], [place] [address]   CLINIC:  Medical Oncology/Hematology  PCP:  Fayrene Helper, MD [address], [place] [address] [phone]   REASON FOR VISIT:  Follow-up for pancreatic cancer.  CURRENT THERAPY: Gemcitabine and Tarceva.  BRIEF ONCOLOGIC HISTORY:    Pancreatic cancer metastasized to liver ([place])   03/10/2014 Initial Diagnosis    Pancreatic cancer metastasized to liver      03/22/2014 - 03/05/2016 Chemotherapy    Abraxane/Gemzar days 1, 8, every 28 days.  Day 15 was cancelled due to leukopenia and thrombocytopenia on day 15 cycle 1.      05/24/2014 Treatment Plan Change    Day 8 of cycle 3 is held with ANC of 1.1      06/05/2014 Imaging    CT C/A/P . Interval decrease in size of the pancreatic tail mass.Improved hepatic metastatic disease. No new lesions. No CT findings for metastatic disease involving the chest.      08/09/2014 Tumor Marker    CA 19-9= 33 (WNL)      10/26/2014 Imaging    MRI- Continued interval decrease in size of the hepatic metastatic lesions and no new lesions are identified. Continued decrease in size of the pancreatic tail lesion.      01/03/2015 Tumor Marker    Results for [name], [name] (MRN [id number]) as of 01/18/2015 12:52  01/03/2015 10:00 CA 19-9: 14       01/17/2015 Imaging    MRI- Response to therapy of hepatic metastasis.  Similar size of a pancreatic tail lesion.      03/20/2015 Imaging    MRI-L spine- Severe disc space narrowing at L2-L3, with endplate reactive changes. Large disc extrusion into the ventral epidural space,central to the RIGHT with a cephalad migrated free fragment. Additional Large disc extrusion into the retroperitone...      03/22/2015 Imaging    CT pelvis- No evidence for metastatic disease within the pelvis.      07/24/2015 Imaging    MRI abd- Continued response to therapy, with no residual detectable liver metastases. No new sites of  metastatic disease in the abdomen.       08/15/2015 Code Status     She confirms desire for DNR status.      12/21/2015 Imaging    MRI liver- Severe image degradation due to motion artifact reducing diagnostic sensitivity and specificity. Reduce conspicuity of the pancreatic tail lesion suggesting further improvement. The original liver lesions have resolved.      03/05/2016 Treatment Plan Change    Chemotherapy holiday/Break after 2 years worth of treatment      04/07/2016 Imaging    CT chest- When compared to recent chest CT, new minimally displaced anterior left sixth rib fracture. Slight increase in subcarinal adenopathy      04/28/2016 Imaging    Bone density- BMD as determined from Femur Neck Right is 0.705 g/cm2 with a T-Score of -2.4. This patient is considered osteopenic according to [facility]) criteria. Compared with the prior study on 02/23/2013, the BMD of the lumbar spine/rt. femoral neck show a statistically significant decrease.       05/23/2016 Imaging    MRI abd- 1. Exam is significantly degraded by patient respiratory motion. Consider follow-up exams with CT abdomen with without contrast per pancreatic protocol. 2. Fullness in tail of pancreas is more prominent and potentially increased in size. Recommend close attention on follow-up. (Consider CT as above) 3.  No explanation for back pain.      09/11/2016 Imaging    MRI pancreas- Interval progression of pancreatic tail lesion. New differential perfusion right liver with areas of heterogeneity. Underlying metastatic disease in this region not excluded.      09/11/2016 Progression    MRi in conjunction with rising CA 19-9 are indicative of relapse of disease.      01/09/2017 Progression    CT C/A/P: 2.1 x 2.9 cm lesion in the pancreatic tail and abutting the splenic hilum, corresponding to known primary pancreatic neoplasm, mildly increased.  Scattered small hypoenhancing lesions in  the liver measuring up to 10 mm, approximately 8-10 in number, suspicious for hepatic metastases.  No findings specific for metastatic disease in the chest.  Additional ancillary findings as above.       01/27/2017 - 10/20/2017 Chemotherapy    The patient had gemcitabine (GEMZAR) 1,292 mg in sodium chloride 0.9 % 250 mL chemo infusion, 800 mg/m2 = 1,292 mg, Intravenous,  Once, 9 of 12 cycles Administration: 1,292 mg (01/28/2017), 1,292 mg (02/11/2017), 1,292 mg (02/25/2017), 1,292 mg (03/11/2017), 1,292 mg (03/25/2017), 1,292 mg (04/08/2017), 1,292 mg (04/22/2017), 1,292 mg (04/29/2017), 1,292 mg (05/20/2017), 1,292 mg (06/03/2017), 1,292 mg (06/24/2017), 1,292 mg (07/08/2017), 1,292 mg (07/29/2017), 1,292 mg (08/12/2017), 1,292 mg (08/26/2017), 1,292 mg (09/09/2017), 1,292 mg (09/23/2017), 1,292 mg (10/07/2017)  for chemotherapy treatment.       10/20/2017 -  Chemotherapy    The patient had leucovorin 700 mg in dextrose 5 % 250 mL infusion, 644 mg, Intravenous,  Once, 1 of 4 cycles Administration: 700 mg (10/21/2017) fluorouracil (ADRUCIL) 3,850 mg in sodium chloride 0.9 % 73 mL chemo infusion, 2,400 mg/m2 = 3,850 mg, Intravenous, 1 Day/Dose, 1 of 4 cycles Administration: 3,850 mg (10/21/2017) irinotecan LIPOSOME (ONIVYDE) 81.7 mg in sodium chloride 0.9 % 500 mL chemo infusion, 50 mg/m2 = 81.7 mg (100 % of original dose 50 mg/m2), Intravenous, Once, 1 of 4 cycles Dose modification: 50 mg/m2 (original dose 50 mg/m2, Cycle 1, Reason: Provider Judgment) Administration: 81.7 mg (10/21/2017)  for chemotherapy treatment.         CANCER STAGING: Cancer Staging Pancreatic cancer metastasized to liver [facility]) Staging form: Pancreas, AJCC 7th Edition - Clinical: Stage IV (T3, N1, M1) - Signed by Baird Cancer, PA-C on 03/16/2014 - Pathologic: No stage assigned - Unsigned    INTERVAL HISTORY:  [name] 84 y.o. female returns for follow-up of scan results.  She is tolerating gemcitabine and Tarceva  without any major side effects.  Occasionally gets diarrhea.  Some soreness in the back of the neck reported for the last couple of days.  Otherwise denies any new onset pains.  No fevers or infections were reported.  No skin rashes.    REVIEW OF SYSTEMS:  Review of Systems  All other systems reviewed and are negative.    PAST MEDICAL/SURGICAL HISTORY:  Past Medical History:  Diagnosis Date  . Anemia due to antineoplastic chemotherapy 09/12/2015   Started Aranesp 500 mcg on 09/12/2015  . Anxiety   . Chronic diarrhea   . Depression   . DNR (do not resuscitate) 08/16/2015  . Erosive esophagitis   . GERD (gastroesophageal reflux disease)   . Hx of adenomatous colonic polyps    tubular adenomas, last found in 2008  . Hyperplastic colon polyp 03/19/10   tcs by Dr. Gala [place]  . Hypertension 20 years   . Kidney stone    hx/ crushed   . Opioid contract exists 04/18/2015  With Dr. Moshe Cipro  . Pancreatic cancer ([place]) 02/2014  . Pancreatic cancer metastasized to liver ([place]) 03/10/2014  . Schatzki's ring 08/26/10   Last dilated on EGD by Dr. Trevor Iha [facility], linear gastric erosions, BI hemigastrectomy   Past Surgical History:  Procedure Laterality Date  . BREAST LUMPECTOMY Left   . bunion removal     from both feet   . CHOLECYSTECTOMY  1965   . COLONOSCOPY  03/19/2010   DR Gala [place],, normal TI, pancolonic diverticula, random colon bx neg., hyperplastic polyps removed  . ESOPHAGOGASTRODUODENOSCOPY  11/22/2003   DR Gala [place], erosive RE, Billroth I  . ESOPHAGOGASTRODUODENOSCOPY  08/26/10   Dr. Gala [place]- moderate severe ERE, Scahtzki ring s/p dilation, Billroth I, linear gastric erosions, bx-gastric xanthelasma  . LEFT SHOULDER SURGERY  2009   DR HARRISON  . ORIF ANKLE FRACTURE Right 05/22/2013   Procedure: OPEN REDUCTION INTERNAL FIXATION (ORIF) RIGHT ANKLE FRACTURE;  Surgeon: Sanjuana Kava, MD;  Location: [facility];  Service: Orthopedics;  Laterality: Right;  . stomach ulcer  50 years ago    had some of  her stomach removed      SOCIAL HISTORY:  Social History   Socioeconomic History  . Marital status: Widowed    Spouse name: Not on file  . Number of children: 2  . Years of education: Not on file  . Highest education level: Not on file  Occupational History  . Occupation: retired from Estate manager/land agent: RETIRED  Social Needs  . Financial resource strain: Not on file  . Food insecurity:    Worry: Not on file    Inability: Not on file  . Transportation needs:    Medical: Not on file    Non-medical: Not on file  Tobacco Use  . Smoking status: Current Some Day Smoker    Packs/day: 0.50    Years: 60.00    Pack years: 30.00    Types: Cigarettes    Last attempt to quit: 09/09/2016    Years since quitting: 1.1  . Smokeless tobacco: Never Used  Substance and Sexual Activity  . Alcohol use: No  . Drug use: No  . Sexual activity: Never  Lifestyle  . Physical activity:    Days per week: Not on file    Minutes per session: Not on file  . Stress: Not on file  Relationships  . Social connections:    Talks on phone: Not on file    Gets together: Not on file    Attends religious service: Not on file    Active member of club or organization: Not on file    Attends meetings of clubs or organizations: Not on file    Relationship status: Not on file  . Intimate partner violence:    Fear of current or ex partner: Not on file    Emotionally abused: Not on file    Physically abused: Not on file    Forced sexual activity: Not on file  Other Topics Concern  . Not on file  Social History Narrative  . Not on file    FAMILY HISTORY:  Family History  Problem Relation Age of Onset  . Hypertension Mother   . Kidney failure Brother        on dialysis  . Diabetes Sister   . Diabetes Sister   . Hypertension Father   . Kidney failure Sister   . Kidney Stones Brother   . Breast cancer Son   . Gout Son   . Liver  disease Neg Hx   . Colon cancer Neg Hx     CURRENT MEDICATIONS:    Outpatient Encounter Medications as of 10/21/2017  Medication Sig  . acetaminophen (TYLENOL) 500 MG tablet Take 500 mg by mouth every 6 (six) hours as needed for mild pain or moderate pain.   Marland Kitchen amLODipine (NORVASC) 10 MG tablet TAKE 1 TABLET BY MOUTH EVERY DAY  . clonazePAM (KLONOPIN) 0.5 MG tablet TAKE 1 TABLET BY MOUTH DAILY AS NEEDED FOR ANXIETY  . cycloSPORINE (RESTASIS) 0.05 % ophthalmic emulsion Place 1 drop into both eyes 2 (two) times daily.  . diphenhydrAMINE (BENADRYL) 50 MG tablet Take 1 tablet 1 hour prior to CT  . diphenoxylate-atropine (LOMOTIL) 2.5-0.025 MG tablet Take by mouth 4 (four) times daily as needed for diarrhea or loose stools.  Marland Kitchen dronabinol (MARINOL) 2.5 MG capsule Take 1 capsule (2.5 mg total) by mouth 2 (two) times daily before a meal.  . erlotinib (TARCEVA) 100 MG tablet Take 1 tablet (100 mg total) by mouth daily. Take on an empty stomach 1 hour before meals or 2 hours after  . Gemcitabine HCl (GEMZAR IV) Inject into the vein. Day 1,day 1 every 28 days  . lidocaine-prilocaine (EMLA) cream APPLY A QUARTER SIZE AMOUNT TO PORT SITE 1 HOURS PRIOR TO CHEMO DO NOT RUB IN COVER WITH PLASTIC WRAP  . mirtazapine (REMERON) 7.5 MG tablet Take 1 tablet (7.5 mg total) by mouth at bedtime.  Marland Kitchen oxybutynin (DITROPAN-XL) 5 MG 24 hr tablet TK 1 T PO QD  . pantoprazole (PROTONIX) 20 MG tablet TAKE 1 TABLET(20 MG) BY MOUTH DAILY  . potassium chloride (K-DUR) 10 MEQ tablet Take 1 tablet (10 mEq total) by mouth 2 (two) times daily.  . predniSONE (DELTASONE) 50 MG tablet Take 1 tablet 13 hours, 7 hours and 1 hour prior to CT  . traMADol (ULTRAM) 50 MG tablet One tablet once daily, as needed, for uncontrolled pain  . [DISCONTINUED] prochlorperazine (COMPAZINE) 10 MG tablet Take 1 tablet (10 mg total) by mouth every 6 (six) hours as needed (Nausea or vomiting).   Facility-Administered Encounter Medications as of 10/21/2017  Medication  . [COMPLETED] 0.9 %  sodium chloride infusion  .  dexamethasone (DECADRON) 10 mg in sodium chloride 0.9 % 50 mL IVPB  . [COMPLETED] dexamethasone (DECADRON) injection 10 mg  . fluorouracil (ADRUCIL) 3,850 mg in sodium chloride 0.9 % 73 mL chemo infusion  . [COMPLETED] irinotecan LIPOSOME (ONIVYDE) 81.7 mg in sodium chloride 0.9 % 500 mL chemo infusion  . [COMPLETED] leucovorin 700 mg in dextrose 5 % 250 mL infusion  . [COMPLETED] prochlorperazine (COMPAZINE) tablet 10 mg  . sodium chloride flush (NS) 0.9 % injection 10 mL    ALLERGIES:  Allergies  Allergen Reactions  . Iohexol Hives and Shortness Of Breath    PATIENT HAD TO BE TAKEN TO ED, C/O WHELPS, HIVES, DIFFICULTY BREATHING. PATIENT GIVEN IV CONTRAST AFTER 13 HOUR PRE MEDS ON 01/09/2017 WITH NO REACTION NOTED. On 10/19/17 patient had reaction despite premedication.    Marland Kitchen Aciphex [Rabeprazole Sodium] Other (See Comments)    unknown  . Amlodipine Besylate-Valsartan     Rash to high-dose (5/320)  . Esomeprazole Magnesium   . Omeprazole Other (See Comments)    Patient states "medication didn't work"  . Ciprofloxacin Rash  . Penicillins Swelling and Rash    Has patient had a PCN reaction causing immediate rash, facial/tongue/throat swelling, SOB or lightheadedness with hypotension: Yes Has patient had a PCN reaction causing severe  rash involving mucus membranes or skin necrosis: Yes Has patient had a PCN reaction that required hospitalization: No Has patient had a PCN reaction occurring within the last 10 years: yes If all of the above answers are "NO", then may proceed with Cephalosporin use.      PHYSICAL EXAM:  ECOG Performance status: 1  I have reviewed her vitals.  Blood pressure is 159/61 and pulse rate is 66.  Temperature is 98. Physical Exam Deferred.  LABORATORY DATA:  I have reviewed the labs as listed.  CBC    Component Value Date/Time   WBC 9.1 10/21/2017 0949   RBC 3.54 (L) 10/21/2017 0949   HGB 10.3 (L) 10/21/2017 0949   HCT 31.7 (L) 10/21/2017 0949   PLT  208 10/21/2017 0949   MCV 89.5 10/21/2017 0949   MCH 29.1 10/21/2017 0949   MCHC 32.5 10/21/2017 0949   RDW 17.1 (H) 10/21/2017 0949   LYMPHSABS 1.9 10/21/2017 0949   MONOABS 0.7 10/21/2017 0949   EOSABS 0.2 10/21/2017 0949   BASOSABS 0.0 10/21/2017 0949   CMP Latest Ref Rng & Units 10/21/2017 10/07/2017 09/23/2017  Glucose 65 - 99 mg/dL 110(H) 96 110(H)  BUN 6 - 20 mg/dL 25(H) 27(H) 20  Creatinine 0.44 - 1.00 mg/dL 1.35(H) 1.68(H) 1.33(H)  Sodium 135 - 145 mmol/L 140 138 138  Potassium 3.5 - 5.1 mmol/L 3.8 3.8 3.9  Chloride 101 - 111 mmol/L 104 101 107  CO2 22 - 32 mmol/L '29 28 25  ' Calcium 8.9 - 10.3 mg/dL 8.5(L) 8.3(L) 8.3(L)  Total Protein 6.5 - 8.1 g/dL 7.4 7.0 6.9  Total Bilirubin 0.3 - 1.2 mg/dL 0.7 0.5 0.9  Alkaline Phos 38 - 126 U/L 143(H) 143(H) 181(H)  AST 15 - 41 U/L '25 23 25  ' ALT 14 - 54 U/L '17 14 19       ' DIAGNOSTIC IMAGING:  I have independently reviewed the images of the CT scan of the chest, abdomen and pelvis and agree with report.     ASSESSMENT & PLAN:   Pancreatic cancer metastasized to liver ([place]) 1.  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 28 days started on 01/28/2017 -CT scan done on 07/13/2017 shows stable liver disease. CA 19-9 was on 06/24/2017 at 129.  CA-19-9 at last visit went up to 171. - I discussed the latest CT scan reports which showed progression in the liver lesions which are very subtle.  There is progression in the tail of the pancreas lesion measuring 27 x 27 mm.  Even though the progression is very small, her CA 19-9 is continuing to go up, last one at 171.  Hence I have recommended a change in her treatment.  We will discontinue gemcitabine.  I have told her to stop taking Tarceva.  We talked about  starting her on 5-FU along with liposomal Irinotecan.  We discussed the side effects in detail.  She is agreeable to this plan.  We plan to rescan her after 2 to 3 months of treatment.  I would start her on liposomal Irinotecan at a decreased dose of 50 mg/m2 and increase it to full dose at 70 mg/m2 if she tolerates it well.  Questions were encouraged and answered to her satisfaction.  2.  Weight loss: She  initially lost 67 pounds.  I have started her on Marinol twice daily at 2.5 mg.  She has gained couple of pounds since last visit.      Orders placed this encounter:  Orders Placed This Encounter  Procedures  . Cancer antigen 19-9  . CBC with Differential ([place] Only)  . CMP ([place] only)   Total time spent is 40 minutes with more than 50% of the time spent discussing scan results, blood test results, treatment plan change, and adverse effects.   Derek Jack, MD [place] [phone]

## 2017-10-21 NOTE — Progress Notes (Signed)
Patient taking Tarceva as prescribed. Has not missed any doses & reports no side effects.  Dr. Delton Coombes reviewed scans and labs with patient today. There will be a change in her treatment per MD. Proceed today with new treatment.   Chemotherapy education pulled from VIA pathways, consent obtained, family and patient instructed on chemotherapy and possible side effects, ambulatory pump teaching done, video watched by patient and family, copy of CADD legacy infusion pump patient instructions given to the patient. Instructed patient to come back in on Friday at 1400.   Treatment given per orders. Patient tolerated it well without problems. Vitals stable and discharged home from clinic ambulatory. Follow up as scheduled.  Marland Kitchen

## 2017-10-21 NOTE — Patient Instructions (Signed)
[facility] Discharge Instructions for Patients Receiving Chemotherapy   Beginning January 23rd 2017 lab work for the Cancer Center will be done in the  Main lab at [facility] on 1st floor. If you have a lab appointment with the Cancer Center please come in thru the  Main Entrance and check in at the main information desk   Today you received the following chemotherapy agents   To help prevent nausea and vomiting after your treatment, we encourage you to take your nausea medication     If you develop nausea and vomiting, or diarrhea that is not controlled by your medication, call the clinic.  The clinic phone number is [phone]. Office hours are Monday-Friday 8:30am-5:00pm.  BELOW ARE SYMPTOMS THAT SHOULD BE REPORTED IMMEDIATELY:  *FEVER GREATER THAN 101.0 F  *CHILLS WITH OR WITHOUT FEVER  NAUSEA AND VOMITING THAT IS NOT CONTROLLED WITH YOUR NAUSEA MEDICATION  *UNUSUAL SHORTNESS OF BREATH  *UNUSUAL BRUISING OR BLEEDING  TENDERNESS IN MOUTH AND THROAT WITH OR WITHOUT PRESENCE OF ULCERS  *URINARY PROBLEMS  *BOWEL PROBLEMS  UNUSUAL RASH Items with * indicate a potential emergency and should be followed up as soon as possible. If you have an emergency after office hours please contact your primary care physician or go to the nearest emergency department.  Please call the clinic during office hours if you have any questions or concerns.   You may also contact the Patient Navigator at [phone] should you have any questions or need assistance in obtaining follow up care.      Resources For Cancer Patients and their Caregivers ? American Cancer Society: Can assist with transportation, wigs, general needs, runs Look Good Feel Better.        1-888-227-6333 ? Cancer Care: Provides financial assistance, online support groups, medication/co-pay assistance.  1-800-813-HOPE (4673) ? [facility] Assists [place] cancer  patients and their families through emotional , educational and financial support.  336-427-4357 ? [place] DSS Where to apply for food stamps, Medicaid and utility assistance. 336-342-1394 ? [facility]: Transportation to medical appointments. 336-347-2287 ? Social Security Administration: May apply for disability if have a Stage IV cancer. 336-342-7796 1-800-772-1213 ? [place] Aging, Disability and Transit Services: Assists with nutrition, care and transit needs. 336-349-2343         

## 2017-10-22 ENCOUNTER — Telehealth (HOSPITAL_COMMUNITY): Payer: Self-pay

## 2017-10-22 NOTE — Telephone Encounter (Signed)
24 hour call back-patient states she is doing good, no problems , continuous iv pump is running, no issues with this.

## 2017-10-23 ENCOUNTER — Other Ambulatory Visit: Payer: Self-pay

## 2017-10-23 ENCOUNTER — Inpatient Hospital Stay (HOSPITAL_COMMUNITY): Payer: Medicare HMO

## 2017-10-23 VITALS — BP 137/55 | HR 60 | Temp 97.8°F | Resp 18

## 2017-10-23 DIAGNOSIS — Z5111 Encounter for antineoplastic chemotherapy: Secondary | ICD-10-CM | POA: Diagnosis not present

## 2017-10-23 DIAGNOSIS — C787 Secondary malignant neoplasm of liver and intrahepatic bile duct: Secondary | ICD-10-CM | POA: Diagnosis not present

## 2017-10-23 DIAGNOSIS — C259 Malignant neoplasm of pancreas, unspecified: Secondary | ICD-10-CM

## 2017-10-23 MED ORDER — HEPARIN SOD (PORK) LOCK FLUSH 100 UNIT/ML IV SOLN
500.0000 [IU] | Freq: Once | INTRAVENOUS | Status: AC | PRN
  Administered 2017-10-23: 500 [IU]
  Filled 2017-10-23: qty 5

## 2017-10-23 MED ORDER — SODIUM CHLORIDE 0.9% FLUSH
10.0000 mL | INTRAVENOUS | Status: DC | PRN
  Administered 2017-10-23: 10 mL
  Filled 2017-10-23: qty 10

## 2017-10-23 NOTE — Progress Notes (Signed)
[name] presents to have home infusion pump d/c'd and for port-a-cath deaccess with flush.  Portacath located right chest wall accessed with  H 20 needle.  Good blood return present. Portacath flushed with NS and 500U/1ml Heparin, and needle removed intact.  Procedure tolerated well and without incident.  Discharged ambulatory.

## 2017-10-26 DIAGNOSIS — J309 Allergic rhinitis, unspecified: Secondary | ICD-10-CM | POA: Diagnosis not present

## 2017-10-26 DIAGNOSIS — K219 Gastro-esophageal reflux disease without esophagitis: Secondary | ICD-10-CM | POA: Diagnosis not present

## 2017-10-26 DIAGNOSIS — I10 Essential (primary) hypertension: Secondary | ICD-10-CM | POA: Diagnosis not present

## 2017-10-26 DIAGNOSIS — R69 Illness, unspecified: Secondary | ICD-10-CM | POA: Diagnosis not present

## 2017-10-26 DIAGNOSIS — G8929 Other chronic pain: Secondary | ICD-10-CM | POA: Diagnosis not present

## 2017-10-26 DIAGNOSIS — N3941 Urge incontinence: Secondary | ICD-10-CM | POA: Diagnosis not present

## 2017-10-26 DIAGNOSIS — K5900 Constipation, unspecified: Secondary | ICD-10-CM | POA: Diagnosis not present

## 2017-10-26 DIAGNOSIS — C259 Malignant neoplasm of pancreas, unspecified: Secondary | ICD-10-CM | POA: Diagnosis not present

## 2017-10-26 DIAGNOSIS — H547 Unspecified visual loss: Secondary | ICD-10-CM | POA: Diagnosis not present

## 2017-10-26 DIAGNOSIS — H04129 Dry eye syndrome of unspecified lacrimal gland: Secondary | ICD-10-CM | POA: Diagnosis not present

## 2017-10-28 ENCOUNTER — Encounter: Payer: Self-pay | Admitting: Family Medicine

## 2017-10-28 ENCOUNTER — Other Ambulatory Visit: Payer: Self-pay

## 2017-10-28 ENCOUNTER — Ambulatory Visit (INDEPENDENT_AMBULATORY_CARE_PROVIDER_SITE_OTHER): Payer: Medicare HMO | Admitting: Family Medicine

## 2017-10-28 VITALS — BP 150/60 | HR 68 | Ht 63.5 in | Wt 122.1 lb

## 2017-10-28 DIAGNOSIS — C787 Secondary malignant neoplasm of liver and intrahepatic bile duct: Secondary | ICD-10-CM

## 2017-10-28 DIAGNOSIS — C259 Malignant neoplasm of pancreas, unspecified: Secondary | ICD-10-CM

## 2017-10-28 DIAGNOSIS — F419 Anxiety disorder, unspecified: Secondary | ICD-10-CM | POA: Diagnosis not present

## 2017-10-28 DIAGNOSIS — K219 Gastro-esophageal reflux disease without esophagitis: Secondary | ICD-10-CM

## 2017-10-28 DIAGNOSIS — R69 Illness, unspecified: Secondary | ICD-10-CM | POA: Diagnosis not present

## 2017-10-28 DIAGNOSIS — I10 Essential (primary) hypertension: Secondary | ICD-10-CM

## 2017-10-28 MED ORDER — OLMESARTAN MEDOXOMIL 20 MG PO TABS: 20.0000 mg | ORAL_TABLET | Freq: Every day | ORAL | 2 refills | Status: DC

## 2017-10-28 NOTE — Patient Instructions (Signed)
MD t0 check BP complimentary at 1 pm next week Thursday  MD follow up in 2 months, call if you need me before  Please start once daily Gummy multivitamins and once daily Booost puddings  New for blood pressure along with what you take is olmasartan 20 mg take once daily at the same time every day,  Take this with th evening BP med  Thank you  for choosing [facility]. We consider it a privelige to serve you.  Delivering excellent health care in a caring and  compassionate way is our goal.  Partnering with you,  so that together we can achieve this goal is our strategy.

## 2017-10-28 NOTE — Progress Notes (Signed)
   [name]     MRN: [id number]      DOB: [birth year]   HPI [name] is here for follow up and re-evaluation of chronic medical conditions, medication management and review of any available recent lab and radiology data.  C/o uncontrolled blood pressure on several occasions, she remained asymptomatic, but is here concerned that the blood pressure reading has been markedly elevatd on more than one occasion Also concerned about an allergic reaction to CT dye administered , states did not have this reaction before and son who normally is out of state is present and expresses concern about the variety of different  Oncologists she has been seeing and whether this may have anything to do with it. For the first time pt was recently told she may need to change her chemotherapy and that her pancreatic cancer is not doing as well as she had been told in the past, wants to review this with me, however I deferred this to the oncologist who she will see next week in consultation C/o sores on corners of her mouth which started 1 day ago, no problem with eating as a result  ROS Denies recent fever or chills. Denies sinus pressure, nasal congestion, ear pain or sore throat. Denies chest congestion, productive cough or wheezing. Denies chest pains, palpitations and leg swelling Denies abdominal pain, nausea, vomiting,diarrhea or constipation.   Denies dysuria, frequency, hesitancy or incontinence. Denies uncontrolled  joint pain, swelling and limitation in mobility. Denies headaches, seizures, numbness, or tingling. Denies depression, does have mild  anxiety and insomnia.   PE  BP (!) 150/60 (BP Location: Left Arm, Patient Position: Sitting, Cuff Size: Normal)   Pulse 68   Ht 5' 3.5" (1.613 m)   Wt 122 lb 1.9 oz (55.4 kg)   SpO2 98%   BMI 21.29 kg/m   Patient alert and oriented and in no cardiopulmonary distress.  HEENT: No facial asymmetry, EOMI,   oropharynx pink and moist.  Neck supple no  JVD, no mass.No open lesions seen at angles of mouth  Chest: Clear to auscultation bilaterally.  CVS: S1, S2 no murmurs, no S3.Regular rate.  ABD: Soft non tender.   Ext: No edema  MS: Adequate ROM spine, shoulders, hips and knees.  Skin: Intact, no ulcerations or rash noted.  Psych: Good eye contact, normal affect.  not anxious or depressed appearing.  CNS: CN 2-12 intact, power,  normal throughout.no focal deficits noted.   Assessment & Plan  Essential hypertension Uncontrolled , add benicar 20 mg daily, complimentary MD blood pressure check in 1 week. Continue other meds as before  Pancreatic cancer metastasized to liver ([place]) Pt to follow up with oncologist in 1 week to discuss current stage of her disease and management plan  GERD (gastroesophageal reflux disease) Controlled, no change in medication   Anxiety Controlled, no change in medication

## 2017-11-02 ENCOUNTER — Encounter: Payer: Self-pay | Admitting: Family Medicine

## 2017-11-02 NOTE — Assessment & Plan Note (Signed)
Controlled, no change in medication  

## 2017-11-02 NOTE — Assessment & Plan Note (Signed)
Uncontrolled , add benicar 20 mg daily, complimentary MD blood pressure check in 1 week. Continue other meds as before

## 2017-11-02 NOTE — Assessment & Plan Note (Signed)
Pt to follow up with oncologist in 1 week to discuss current stage of her disease and management plan

## 2017-11-04 ENCOUNTER — Inpatient Hospital Stay (HOSPITAL_BASED_OUTPATIENT_CLINIC_OR_DEPARTMENT_OTHER): Payer: Medicare HMO | Admitting: Hematology

## 2017-11-04 ENCOUNTER — Encounter (HOSPITAL_COMMUNITY): Payer: Self-pay | Admitting: Hematology

## 2017-11-04 ENCOUNTER — Inpatient Hospital Stay (HOSPITAL_COMMUNITY): Payer: Medicare HMO

## 2017-11-04 VITALS — BP 154/51 | HR 64 | Temp 97.8°F | Resp 16 | Wt 123.6 lb

## 2017-11-04 VITALS — BP 167/59 | HR 70 | Temp 97.5°F | Resp 16

## 2017-11-04 DIAGNOSIS — C787 Secondary malignant neoplasm of liver and intrahepatic bile duct: Secondary | ICD-10-CM | POA: Diagnosis not present

## 2017-11-04 DIAGNOSIS — R634 Abnormal weight loss: Secondary | ICD-10-CM | POA: Diagnosis not present

## 2017-11-04 DIAGNOSIS — R69 Illness, unspecified: Secondary | ICD-10-CM | POA: Diagnosis not present

## 2017-11-04 DIAGNOSIS — C259 Malignant neoplasm of pancreas, unspecified: Secondary | ICD-10-CM

## 2017-11-04 DIAGNOSIS — F17210 Nicotine dependence, cigarettes, uncomplicated: Secondary | ICD-10-CM | POA: Diagnosis not present

## 2017-11-04 DIAGNOSIS — I10 Essential (primary) hypertension: Secondary | ICD-10-CM

## 2017-11-04 DIAGNOSIS — Z5111 Encounter for antineoplastic chemotherapy: Secondary | ICD-10-CM | POA: Diagnosis not present

## 2017-11-04 DIAGNOSIS — R3915 Urgency of urination: Secondary | ICD-10-CM

## 2017-11-04 LAB — CBC WITH DIFFERENTIAL/PLATELET
Basophils Absolute: 0 10*3/uL (ref 0.0–0.1)
Basophils Relative: 0 %
Eosinophils Absolute: 0.2 10*3/uL (ref 0.0–0.7)
Eosinophils Relative: 4 %
HCT: 29.4 % — ABNORMAL LOW (ref 36.0–46.0)
HEMOGLOBIN: 9.7 g/dL — AB (ref 12.0–15.0)
LYMPHS PCT: 18 %
Lymphs Abs: 1.1 10*3/uL (ref 0.7–4.0)
MCH: 29.8 pg (ref 26.0–34.0)
MCHC: 33 g/dL (ref 30.0–36.0)
MCV: 90.2 fL (ref 78.0–100.0)
MONOS PCT: 7 %
Monocytes Absolute: 0.4 10*3/uL (ref 0.1–1.0)
NEUTROS ABS: 4.3 10*3/uL (ref 1.7–7.7)
NEUTROS PCT: 71 %
Platelets: 144 10*3/uL — ABNORMAL LOW (ref 150–400)
RBC: 3.26 MIL/uL — ABNORMAL LOW (ref 3.87–5.11)
RDW: 17.3 % — ABNORMAL HIGH (ref 11.5–15.5)
WBC: 6.1 10*3/uL (ref 4.0–10.5)

## 2017-11-04 LAB — COMPREHENSIVE METABOLIC PANEL
ALT: 23 U/L (ref 0–44)
AST: 31 U/L (ref 15–41)
Albumin: 3.4 g/dL — ABNORMAL LOW (ref 3.5–5.0)
Alkaline Phosphatase: 190 U/L — ABNORMAL HIGH (ref 38–126)
Anion gap: 9 (ref 5–15)
BILIRUBIN TOTAL: 0.5 mg/dL (ref 0.3–1.2)
BUN: 21 mg/dL (ref 8–23)
CO2: 26 mmol/L (ref 22–32)
CREATININE: 1.24 mg/dL — AB (ref 0.44–1.00)
Calcium: 8.7 mg/dL — ABNORMAL LOW (ref 8.9–10.3)
Chloride: 107 mmol/L (ref 98–111)
GFR, EST AFRICAN AMERICAN: 44 mL/min — AB (ref >60)
GFR, EST NON AFRICAN AMERICAN: 38 mL/min — AB (ref >60)
Glucose, Bld: 109 mg/dL — ABNORMAL HIGH (ref 70–99)
Potassium: 3.9 mmol/L (ref 3.5–5.1)
Sodium: 142 mmol/L (ref 135–145)
TOTAL PROTEIN: 7.1 g/dL (ref 6.5–8.1)

## 2017-11-04 MED ORDER — LEUCOVORIN CALCIUM INJECTION 350 MG
700.0000 mg | Freq: Once | INTRAVENOUS | Status: AC
  Administered 2017-11-04: 700 mg via INTRAVENOUS
  Filled 2017-11-04: qty 35

## 2017-11-04 MED ORDER — SODIUM CHLORIDE 0.9% FLUSH
10.0000 mL | INTRAVENOUS | Status: DC | PRN
  Administered 2017-11-04: 10 mL
  Filled 2017-11-04: qty 10

## 2017-11-04 MED ORDER — MIRABEGRON ER 25 MG PO TB24: 25.0000 mg | ORAL_TABLET | Freq: Every day | ORAL | 2 refills | Status: DC

## 2017-11-04 MED ORDER — DEXAMETHASONE SODIUM PHOSPHATE 10 MG/ML IJ SOLN
10.0000 mg | Freq: Once | INTRAMUSCULAR | Status: AC
  Administered 2017-11-04: 10 mg via INTRAVENOUS
  Filled 2017-11-04: qty 1

## 2017-11-04 MED ORDER — SODIUM CHLORIDE 0.9 % IV SOLN
1920.0000 mg/m2 | INTRAVENOUS | Status: DC
  Administered 2017-11-04: 3100 mg via INTRAVENOUS
  Filled 2017-11-04: qty 52

## 2017-11-04 MED ORDER — SODIUM CHLORIDE 0.9 % IV SOLN
50.0000 mg/m2 | Freq: Once | INTRAVENOUS | Status: AC
  Administered 2017-11-04: 81.7 mg via INTRAVENOUS
  Filled 2017-11-04: qty 19

## 2017-11-04 MED ORDER — SODIUM CHLORIDE 0.9 % IV SOLN
Freq: Once | INTRAVENOUS | Status: AC
  Administered 2017-11-04: 11:00:00 via INTRAVENOUS
  Filled 2017-11-04: qty 4

## 2017-11-04 MED ORDER — SODIUM CHLORIDE 0.9 % IV SOLN
Freq: Once | INTRAVENOUS | Status: AC
  Administered 2017-11-04: 11:00:00 via INTRAVENOUS

## 2017-11-04 NOTE — Progress Notes (Signed)
[name], [place] [address]   CLINIC:  Medical Oncology/Hematology  PCP:  Fayrene Helper, MD [address], [place] [place] [address] [phone]   REASON FOR VISIT:  Follow-up for metastatic pancreatic cancer to the liver.  CURRENT THERAPY: Infusional 5-FU and liposomal Irinotecan.  BRIEF ONCOLOGIC HISTORY:    Pancreatic cancer metastasized to liver ([place])   03/10/2014 Initial Diagnosis    Pancreatic cancer metastasized to liver      03/22/2014 - 03/05/2016 Chemotherapy    Abraxane/Gemzar days 1, 8, every 28 days.  Day 15 was cancelled due to leukopenia and thrombocytopenia on day 15 cycle 1.      05/24/2014 Treatment Plan Change    Day 8 of cycle 3 is held with ANC of 1.1      06/05/2014 Imaging    CT C/A/P . Interval decrease in size of the pancreatic tail mass.Improved hepatic metastatic disease. No new lesions. No CT findings for metastatic disease involving the chest.      08/09/2014 Tumor Marker    CA 19-9= 33 (WNL)      10/26/2014 Imaging    MRI- Continued interval decrease in size of the hepatic metastatic lesions and no new lesions are identified. Continued decrease in size of the pancreatic tail lesion.      01/03/2015 Tumor Marker    Results for LACREASHA, HINDS (MRN [id number]) as of 01/18/2015 12:52  01/03/2015 10:00 CA 19-9: 14       01/17/2015 Imaging    MRI- Response to therapy of hepatic metastasis.  Similar size of a pancreatic tail lesion.      03/20/2015 Imaging    MRI-L spine- Severe disc space narrowing at L2-L3, with endplate reactive changes. Large disc extrusion into the ventral epidural space,central to the RIGHT with a cephalad migrated free fragment. Additional Large disc extrusion into the retroperitone...      03/22/2015 Imaging    CT pelvis- No evidence for metastatic disease within the pelvis.      07/24/2015 Imaging    MRI abd- Continued response to therapy, with no residual  detectable liver metastases. No new sites of metastatic disease in the abdomen.       08/15/2015 Code Status     She confirms desire for DNR status.      12/21/2015 Imaging    MRI liver- Severe image degradation due to motion artifact reducing diagnostic sensitivity and specificity. Reduce conspicuity of the pancreatic tail lesion suggesting further improvement. The original liver lesions have resolved.      03/05/2016 Treatment Plan Change    Chemotherapy holiday/Break after 2 years worth of treatment      04/07/2016 Imaging    CT chest- When compared to recent chest CT, new minimally displaced anterior left sixth rib fracture. Slight increase in subcarinal adenopathy      04/28/2016 Imaging    Bone density- BMD as determined from Femur Neck Right is 0.705 g/cm2 with a T-Score of -2.4. This patient is considered osteopenic according to [facility]) criteria. Compared with the prior study on 02/23/2013, the BMD of the lumbar spine/rt. femoral neck show a statistically significant decrease.       05/23/2016 Imaging    MRI abd- 1. Exam is significantly degraded by patient respiratory motion. Consider follow-up exams with CT abdomen with without contrast per pancreatic protocol. 2. Fullness in tail of pancreas is more prominent and potentially increased in size. Recommend close attention on  follow-up. (Consider CT as above) 3. No explanation for back pain.      09/11/2016 Imaging    MRI pancreas- Interval progression of pancreatic tail lesion. New differential perfusion right liver with areas of heterogeneity. Underlying metastatic disease in this region not excluded.      09/11/2016 Progression    MRi in conjunction with rising CA 19-9 are indicative of relapse of disease.      01/09/2017 Progression    CT C/A/P: 2.1 x 2.9 cm lesion in the pancreatic tail and abutting the splenic hilum, corresponding to known primary pancreatic neoplasm,  mildly increased.  Scattered small hypoenhancing lesions in the liver measuring up to 10 mm, approximately 8-10 in number, suspicious for hepatic metastases.  No findings specific for metastatic disease in the chest.  Additional ancillary findings as above.       01/27/2017 - 10/20/2017 Chemotherapy    The patient had gemcitabine (GEMZAR) 1,292 mg in sodium chloride 0.9 % 250 mL chemo infusion, 800 mg/m2 = 1,292 mg, Intravenous,  Once, 9 of 12 cycles Administration: 1,292 mg (01/28/2017), 1,292 mg (02/11/2017), 1,292 mg (02/25/2017), 1,292 mg (03/11/2017), 1,292 mg (03/25/2017), 1,292 mg (04/08/2017), 1,292 mg (04/22/2017), 1,292 mg (04/29/2017), 1,292 mg (05/20/2017), 1,292 mg (06/03/2017), 1,292 mg (06/24/2017), 1,292 mg (07/08/2017), 1,292 mg (07/29/2017), 1,292 mg (08/12/2017), 1,292 mg (08/26/2017), 1,292 mg (09/09/2017), 1,292 mg (09/23/2017), 1,292 mg (10/07/2017)  for chemotherapy treatment.       10/20/2017 -  Chemotherapy    The patient had leucovorin 700 mg in dextrose 5 % 250 mL infusion, 644 mg, Intravenous,  Once, 2 of 4 cycles Administration: 700 mg (10/21/2017), 700 mg (11/04/2017) ondansetron (ZOFRAN) 8 mg in sodium chloride 0.9 % 50 mL IVPB, , Intravenous,  Once, 1 of 3 cycles Administration:  (11/04/2017) fluorouracil (ADRUCIL) 3,850 mg in sodium chloride 0.9 % 73 mL chemo infusion, 2,400 mg/m2 = 3,850 mg, Intravenous, 1 Day/Dose, 2 of 4 cycles Dose modification: 1,920 mg/m2 (original dose 2,400 mg/m2, Cycle 2, Reason: Provider Judgment, Comment: mucisitis) Administration: 3,850 mg (10/21/2017), 3,100 mg (11/04/2017) irinotecan LIPOSOME (ONIVYDE) 81.7 mg in sodium chloride 0.9 % 500 mL chemo infusion, 50 mg/m2 = 81.7 mg (100 % of original dose 50 mg/m2), Intravenous, Once, 2 of 4 cycles Dose modification: 50 mg/m2 (original dose 50 mg/m2, Cycle 1, Reason: Provider Judgment), 50 mg/m2 (original dose 50 mg/m2, Cycle 2, Reason: Provider Judgment) Administration: 81.7 mg (10/21/2017), 81.7 mg  (11/04/2017)  for chemotherapy treatment.         CANCER STAGING: Cancer Staging Pancreatic cancer metastasized to liver [facility]) Staging form: Pancreas, AJCC 7th Edition - Clinical: Stage IV (T3, N1, M1) - Signed by Baird Cancer, PA-C on 03/16/2014 - Pathologic: No stage assigned - Unsigned    INTERVAL HISTORY:  Ms. Fogal 84 y.o. female returns for second cycle of chemotherapy.  After first cycle she felt very tired.  She also felt rawness in her mouth which lasted about 1 week.  She denied any major changes from her baseline diarrhea.  Denies any nausea or vomiting.  Did not experience any abdominal cramping.  Denies any fevers or infections.   REVIEW OF SYSTEMS:  Review of Systems  Constitutional: Positive for fatigue.  HENT:   Positive for mouth sores.   All other systems reviewed and are negative.    PAST MEDICAL/SURGICAL HISTORY:  Past Medical History:  Diagnosis Date  . Anemia due to antineoplastic chemotherapy 09/12/2015   Started Aranesp 500 mcg on 09/12/2015  . Anxiety   .  Chronic diarrhea   . Depression   . DNR (do not resuscitate) 08/16/2015  . Erosive esophagitis   . GERD (gastroesophageal reflux disease)   . Hx of adenomatous colonic polyps    tubular adenomas, last found in 2008  . Hyperplastic colon polyp 03/19/10   tcs by Dr. Gala Romney  . Hypertension 20 years   . Kidney stone    hx/ crushed   . Opioid contract exists 04/18/2015   With Dr. Moshe Cipro  . Pancreatic cancer ([place]) 02/2014  . Pancreatic cancer metastasized to liver ([place]) 03/10/2014  . Schatzki's ring 08/26/10   Last dilated on EGD by Dr. Trevor Iha [facility], linear gastric erosions, BI hemigastrectomy   Past Surgical History:  Procedure Laterality Date  . BREAST LUMPECTOMY Left   . bunion removal     from both feet   . CHOLECYSTECTOMY  1965   . COLONOSCOPY  03/19/2010   DR Gala Romney,, normal TI, pancolonic diverticula, random colon bx neg., hyperplastic polyps removed  . ESOPHAGOGASTRODUODENOSCOPY   11/22/2003   DR Gala Romney, erosive RE, Billroth I  . ESOPHAGOGASTRODUODENOSCOPY  08/26/10   Dr. Gala Romney- moderate severe ERE, Scahtzki ring s/p dilation, Billroth I, linear gastric erosions, bx-gastric xanthelasma  . LEFT SHOULDER SURGERY  2009   DR HARRISON  . ORIF ANKLE FRACTURE Right 05/22/2013   Procedure: OPEN REDUCTION INTERNAL FIXATION (ORIF) RIGHT ANKLE FRACTURE;  Surgeon: Sanjuana Kava, MD;  Location: [facility];  Service: Orthopedics;  Laterality: Right;  . stomach ulcer  50 years ago    had some of her stomach removed      SOCIAL HISTORY:  Social History   Socioeconomic History  . Marital status: Widowed    Spouse name: Not on file  . Number of children: 2  . Years of education: Not on file  . Highest education level: Not on file  Occupational History  . Occupation: retired from Estate manager/land agent: RETIRED  Social Needs  . Financial resource strain: Not on file  . Food insecurity:    Worry: Not on file    Inability: Not on file  . Transportation needs:    Medical: Not on file    Non-medical: Not on file  Tobacco Use  . Smoking status: Current Some Day Smoker    Packs/day: 0.50    Years: 60.00    Pack years: 30.00    Types: Cigarettes    Last attempt to quit: 09/09/2016    Years since quitting: 1.1  . Smokeless tobacco: Never Used  Substance and Sexual Activity  . Alcohol use: No  . Drug use: No  . Sexual activity: Never  Lifestyle  . Physical activity:    Days per week: Not on file    Minutes per session: Not on file  . Stress: Not on file  Relationships  . Social connections:    Talks on phone: Not on file    Gets together: Not on file    Attends religious service: Not on file    Active member of club or organization: Not on file    Attends meetings of clubs or organizations: Not on file    Relationship status: Not on file  . Intimate partner violence:    Fear of current or ex partner: Not on file    Emotionally abused: Not on file    Physically abused: Not  on file    Forced sexual activity: Not on file  Other Topics Concern  . Not on file  Social History Narrative  .  Not on file    FAMILY HISTORY:  Family History  Problem Relation Age of Onset  . Hypertension Mother   . Kidney failure Brother        on dialysis  . Diabetes Sister   . Diabetes Sister   . Hypertension Father   . Kidney failure Sister   . Kidney Stones Brother   . Breast cancer Son   . Gout Son   . Liver disease Neg Hx   . Colon cancer Neg Hx     CURRENT MEDICATIONS:  Outpatient Encounter Medications as of 11/04/2017  Medication Sig  . acetaminophen (TYLENOL) 500 MG tablet Take 500 mg by mouth every 6 (six) hours as needed for mild pain or moderate pain.   Marland Kitchen amLODipine (NORVASC) 10 MG tablet TAKE 1 TABLET BY MOUTH EVERY DAY  . clonazePAM (KLONOPIN) 0.5 MG tablet TAKE 1 TABLET BY MOUTH DAILY AS NEEDED FOR ANXIETY  . cycloSPORINE (RESTASIS) 0.05 % ophthalmic emulsion Place 1 drop into both eyes 2 (two) times daily.  . diphenhydrAMINE (BENADRYL) 50 MG tablet Take 1 tablet 1 hour prior to CT  . diphenoxylate-atropine (LOMOTIL) 2.5-0.025 MG tablet Take by mouth 4 (four) times daily as needed for diarrhea or loose stools.  Marland Kitchen dronabinol (MARINOL) 2.5 MG capsule Take 1 capsule (2.5 mg total) by mouth 2 (two) times daily before a meal. (Patient not taking: Reported on 11/04/2017)  . lidocaine-prilocaine (EMLA) cream APPLY A QUARTER SIZE AMOUNT TO PORT SITE 1 HOURS PRIOR TO CHEMO DO NOT RUB IN COVER WITH PLASTIC WRAP  . metoprolol tartrate (LOPRESSOR) 25 MG tablet Take 1 tablet by mouth 2 (two) times daily.  . mirabegron ER (MYRBETRIQ) 25 MG TB24 tablet Take 1 tablet (25 mg total) by mouth daily.  . mirtazapine (REMERON) 7.5 MG tablet Take 1 tablet (7.5 mg total) by mouth at bedtime.  Marland Kitchen olmesartan (BENICAR) 20 MG tablet Take 1 tablet (20 mg total) by mouth daily.  Marland Kitchen oxybutynin (DITROPAN-XL) 5 MG 24 hr tablet TK 1 T PO QD  . pantoprazole (PROTONIX) 20 MG tablet TAKE 1  TABLET(20 MG) BY MOUTH DAILY  . potassium chloride (K-DUR) 10 MEQ tablet Take 1 tablet (10 mEq total) by mouth 2 (two) times daily. (Patient not taking: Reported on 11/04/2017)  . predniSONE (DELTASONE) 50 MG tablet Take 1 tablet 13 hours, 7 hours and 1 hour prior to CT (Patient not taking: Reported on 11/04/2017)  . traMADol (ULTRAM) 50 MG tablet One tablet once daily, as needed, for uncontrolled pain  . [DISCONTINUED] prochlorperazine (COMPAZINE) 10 MG tablet Take 1 tablet (10 mg total) by mouth every 6 (six) hours as needed (Nausea or vomiting).   Facility-Administered Encounter Medications as of 11/04/2017  Medication  . [COMPLETED] 0.9 %  sodium chloride infusion  . dexamethasone (DECADRON) 10 mg in sodium chloride 0.9 % 50 mL IVPB  . [COMPLETED] dexamethasone (DECADRON) injection 10 mg  . fluorouracil (ADRUCIL) 3,100 mg in sodium chloride 0.9 % 88 mL chemo infusion  . [COMPLETED] irinotecan LIPOSOME (ONIVYDE) 81.7 mg in sodium chloride 0.9 % 500 mL chemo infusion  . [COMPLETED] leucovorin 700 mg in dextrose 5 % 250 mL infusion  . [COMPLETED] ondansetron (ZOFRAN) 8 mg in sodium chloride 0.9 % 50 mL IVPB  . sodium chloride flush (NS) 0.9 % injection 10 mL    ALLERGIES:  Allergies  Allergen Reactions  . Iohexol Hives and Shortness Of Breath    PATIENT HAD TO BE TAKEN TO ED, C/O WHELPS, HIVES, DIFFICULTY  BREATHING. PATIENT GIVEN IV CONTRAST AFTER 13 HOUR PRE MEDS ON 01/09/2017 WITH NO REACTION NOTED. On 10/19/17 patient had reaction despite premedication.    Marland Kitchen Aciphex [Rabeprazole Sodium] Other (See Comments)    unknown  . Amlodipine Besylate-Valsartan     Rash to high-dose (5/320)  . Esomeprazole Magnesium   . Omeprazole Other (See Comments)    Patient states "medication didn't work"  . Ciprofloxacin Rash  . Penicillins Swelling and Rash    Has patient had a PCN reaction causing immediate rash, facial/tongue/throat swelling, SOB or lightheadedness with hypotension: Yes Has patient  had a PCN reaction causing severe rash involving mucus membranes or skin necrosis: Yes Has patient had a PCN reaction that required hospitalization: No Has patient had a PCN reaction occurring within the last 10 years: yes If all of the above answers are "NO", then may proceed with Cephalosporin use.      PHYSICAL EXAM:  ECOG Performance status: 2  Vitals:   11/04/17 0919  BP: (!) 154/51  Pulse: 64  Resp: 16  Temp: 97.8 F (36.6 C)  SpO2: 100%   Filed Weights   11/04/17 0919  Weight: 123 lb 9.6 oz (56.1 kg)    Physical Exam   LABORATORY DATA:  I have reviewed the labs as listed.  CBC    Component Value Date/Time   WBC 6.1 11/04/2017 0838   RBC 3.26 (L) 11/04/2017 0838   HGB 9.7 (L) 11/04/2017 0838   HCT 29.4 (L) 11/04/2017 0838   PLT 144 (L) 11/04/2017 0838   MCV 90.2 11/04/2017 0838   MCH 29.8 11/04/2017 0838   MCHC 33.0 11/04/2017 0838   RDW 17.3 (H) 11/04/2017 0838   LYMPHSABS 1.1 11/04/2017 0838   MONOABS 0.4 11/04/2017 0838   EOSABS 0.2 11/04/2017 0838   BASOSABS 0.0 11/04/2017 0838   CMP Latest Ref Rng & Units 11/04/2017 10/21/2017 10/07/2017  Glucose 70 - 99 mg/dL 109(H) 110(H) 96  BUN 8 - 23 mg/dL 21 25(H) 27(H)  Creatinine 0.44 - 1.00 mg/dL 1.24(H) 1.35(H) 1.68(H)  Sodium 135 - 145 mmol/L 142 140 138  Potassium 3.5 - 5.1 mmol/L 3.9 3.8 3.8  Chloride 98 - 111 mmol/L 107 104 101  CO2 22 - 32 mmol/L _0 Calcium 8.9 - 10.3 mg/dL 8.7(L) 8.5(L) 8.3(L)  Total Protein 6.5 - 8.1 g/dL 7.1 7.4 7.0  Total Bilirubin 0.3 - 1.2 mg/dL 0.5 0.7 0.5  Alkaline Phos 38 - 126 U/L 190(H) 143(H) 143(H)  AST 15 - 41 U/L _1 ALT 0 - 44 U/L _2 ASSESSMENT & PLAN:   Pancreatic cancer metastasized to liver ([facility]) 1.  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 28 days started on 01/28/2017 -CT scan done on 07/13/2017 shows stable liver disease. CA 19-9 was on 06/24/2017 at 129.  CA-19-9 at last visit went up to 171. - I discussed the latest CT scan reports which showed progression in the liver lesions which are very subtle.  There is progression in the tail of the pancreas lesion measuring 27 x 27 mm.  Even though the progression is very small, her CA 19-9 is continuing to go up, last one at 171.  Hence I  have recommended a change in her treatment.  Gemcitabine and Tarceva were discontinued. -We will consider testing BRCA1/2 and PALB2 mutation.  We will also consider testing MSI. - First cycle of infusional 5-FU and liposomal Irinotecan started on 10/21/2017, Onivyde started at a decreased dose of 50 mg/m. -She has developed mucositis which lasted about 1 week.  She also felt more tired with this regimen.  Energy levels are 50%.  I would cut back her infusional 5-FU dose to 1920 mg/m.  She was also told to rinse her mouth with a solution of water, salt and baking soda.  She will come back in 2 weeks for follow-up.  2.  Weight loss: She initially lost 6-7 pounds.  I have started her on Marinol twice daily at 2.5 mg.  She has gained couple of pounds since last visit.      Orders placed this encounter:  Orders Placed This Encounter  Procedures  . CBC with Differential/Platelet  . Comprehensive metabolic panel  . Cancer Antigen 19-9      Derek Jack, MD [place] [phone]

## 2017-11-04 NOTE — Patient Instructions (Signed)
[facility] Discharge Instructions for Patients Receiving Chemotherapy   Beginning January 23rd 2017 lab work for the Cancer Center will be done in the  Main lab at [facility] on 1st floor. If you have a lab appointment with the Cancer Center please come in thru the  Main Entrance and check in at the main information desk   Today you received the following chemotherapy agents   To help prevent nausea and vomiting after your treatment, we encourage you to take your nausea medication     If you develop nausea and vomiting, or diarrhea that is not controlled by your medication, call the clinic.  The clinic phone number is [phone]. Office hours are Monday-Friday 8:30am-5:00pm.  BELOW ARE SYMPTOMS THAT SHOULD BE REPORTED IMMEDIATELY:  *FEVER GREATER THAN 101.0 F  *CHILLS WITH OR WITHOUT FEVER  NAUSEA AND VOMITING THAT IS NOT CONTROLLED WITH YOUR NAUSEA MEDICATION  *UNUSUAL SHORTNESS OF BREATH  *UNUSUAL BRUISING OR BLEEDING  TENDERNESS IN MOUTH AND THROAT WITH OR WITHOUT PRESENCE OF ULCERS  *URINARY PROBLEMS  *BOWEL PROBLEMS  UNUSUAL RASH Items with * indicate a potential emergency and should be followed up as soon as possible. If you have an emergency after office hours please contact your primary care physician or go to the nearest emergency department.  Please call the clinic during office hours if you have any questions or concerns.   You may also contact the Patient Navigator at [phone] should you have any questions or need assistance in obtaining follow up care.      Resources For Cancer Patients and their Caregivers ? American Cancer Society: Can assist with transportation, wigs, general needs, runs Look Good Feel Better.        1-888-227-6333 ? Cancer Care: Provides financial assistance, online support groups, medication/co-pay assistance.  1-800-813-HOPE (4673) ? [facility] Assists [place] cancer  patients and their families through emotional , educational and financial support.  336-427-4357 ? [place] DSS Where to apply for food stamps, Medicaid and utility assistance. 336-342-1394 ? [facility]: Transportation to medical appointments. 336-347-2287 ? Social Security Administration: May apply for disability if have a Stage IV cancer. 336-342-7796 1-800-772-1213 ? [place] [facility]ging, Disability and Transit Services: Assists with nutrition, care and transit needs. 336-349-2343         

## 2017-11-04 NOTE — Assessment & Plan Note (Signed)
1.  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 28 days started on 01/28/2017 -CT scan done on 07/13/2017 shows stable liver disease. CA 19-9 was on 06/24/2017 at 129.  CA-19-9 at last visit went up to 171. - I discussed the latest CT scan reports which showed progression in the liver lesions which are very subtle.  There is progression in the tail of the pancreas lesion measuring 27 x 27 mm.  Even though the progression is very small, her CA 19-9 is continuing to go up, last one at 171.  Hence I have recommended a change in her treatment.  Gemcitabine and Tarceva were discontinued. -We will consider testing BRCA1/2 and PALB2 mutation.  We will also consider testing MSI. - First cycle of infusional 5-FU and liposomal Irinotecan started on 10/21/2017, Onivyde started at a decreased dose of 50 mg/m. -She has developed mucositis which lasted about 1 week.  She also felt more tired with this regimen.  Energy levels are 50%.  I would cut back her infusional 5-FU dose to 1920 mg/m.  She was also told to rinse her mouth with a solution of water, salt and baking soda.  She will come back in 2 weeks for follow-up.  2.  Weight loss: She initially lost 6-7 pounds.  I have started her on Marinol twice daily at 2.5 mg.  She has gained couple of pounds since last visit.

## 2017-11-04 NOTE — Progress Notes (Signed)
Patient seen today by Dr. Delton Coombes, labs reviewed and can proceed with treatment today which was dose reduced.

## 2017-11-05 LAB — CANCER ANTIGEN 19-9: CAN 19-9: 316 U/mL — AB (ref 0–35)

## 2017-11-06 ENCOUNTER — Other Ambulatory Visit: Payer: Self-pay

## 2017-11-06 ENCOUNTER — Encounter (HOSPITAL_COMMUNITY): Payer: Self-pay

## 2017-11-06 ENCOUNTER — Inpatient Hospital Stay (HOSPITAL_COMMUNITY): Payer: Medicare HMO

## 2017-11-06 VITALS — BP 150/52 | HR 57 | Temp 98.4°F | Resp 16

## 2017-11-06 DIAGNOSIS — C259 Malignant neoplasm of pancreas, unspecified: Secondary | ICD-10-CM | POA: Diagnosis not present

## 2017-11-06 DIAGNOSIS — C787 Secondary malignant neoplasm of liver and intrahepatic bile duct: Principal | ICD-10-CM

## 2017-11-06 DIAGNOSIS — Z5111 Encounter for antineoplastic chemotherapy: Secondary | ICD-10-CM | POA: Diagnosis not present

## 2017-11-06 MED ORDER — SODIUM CHLORIDE 0.9% FLUSH
10.0000 mL | INTRAVENOUS | Status: DC | PRN
  Administered 2017-11-06: 10 mL
  Filled 2017-11-06: qty 10

## 2017-11-06 MED ORDER — HEPARIN SOD (PORK) LOCK FLUSH 100 UNIT/ML IV SOLN
500.0000 [IU] | Freq: Once | INTRAVENOUS | Status: AC | PRN
  Administered 2017-11-06: 500 [IU]
  Filled 2017-11-06: qty 5

## 2017-11-06 NOTE — Progress Notes (Signed)
[name] presented for Portacath deaccess and flush.  Portacath located right chest wall accessed with  H 20 needle with pump connected but infusion complete.  Good blood return present. Portacath flushed with 60ml NS and 500U/75ml Heparin and needle removed intact.  Procedure tolerated well and without incident.  Pt stable and discharged home ambulatory using cane and with family.

## 2017-11-09 ENCOUNTER — Ambulatory Visit: Payer: Medicare HMO | Admitting: Family Medicine

## 2017-11-13 ENCOUNTER — Ambulatory Visit: Payer: Medicare HMO | Admitting: Urology

## 2017-11-14 DIAGNOSIS — L402 Acrodermatitis continua: Secondary | ICD-10-CM | POA: Diagnosis not present

## 2017-11-18 ENCOUNTER — Inpatient Hospital Stay (HOSPITAL_COMMUNITY): Payer: Medicare HMO | Attending: Hematology | Admitting: Hematology

## 2017-11-18 ENCOUNTER — Inpatient Hospital Stay (HOSPITAL_COMMUNITY): Payer: Medicare HMO

## 2017-11-18 ENCOUNTER — Other Ambulatory Visit: Payer: Self-pay

## 2017-11-18 ENCOUNTER — Encounter (HOSPITAL_COMMUNITY): Payer: Self-pay | Admitting: Hematology

## 2017-11-18 ENCOUNTER — Inpatient Hospital Stay (HOSPITAL_COMMUNITY): Payer: Medicare HMO | Attending: Hematology and Oncology

## 2017-11-18 VITALS — BP 170/58 | HR 58 | Temp 97.7°F | Resp 18

## 2017-11-18 DIAGNOSIS — C252 Malignant neoplasm of tail of pancreas: Secondary | ICD-10-CM | POA: Insufficient documentation

## 2017-11-18 DIAGNOSIS — R634 Abnormal weight loss: Secondary | ICD-10-CM | POA: Insufficient documentation

## 2017-11-18 DIAGNOSIS — C787 Secondary malignant neoplasm of liver and intrahepatic bile duct: Secondary | ICD-10-CM | POA: Insufficient documentation

## 2017-11-18 DIAGNOSIS — C259 Malignant neoplasm of pancreas, unspecified: Secondary | ICD-10-CM

## 2017-11-18 DIAGNOSIS — R69 Illness, unspecified: Secondary | ICD-10-CM | POA: Diagnosis not present

## 2017-11-18 DIAGNOSIS — Z5111 Encounter for antineoplastic chemotherapy: Secondary | ICD-10-CM | POA: Insufficient documentation

## 2017-11-18 DIAGNOSIS — F17210 Nicotine dependence, cigarettes, uncomplicated: Secondary | ICD-10-CM

## 2017-11-18 DIAGNOSIS — I10 Essential (primary) hypertension: Secondary | ICD-10-CM

## 2017-11-18 DIAGNOSIS — Z803 Family history of malignant neoplasm of breast: Secondary | ICD-10-CM

## 2017-11-18 DIAGNOSIS — R197 Diarrhea, unspecified: Secondary | ICD-10-CM | POA: Diagnosis not present

## 2017-11-18 LAB — COMPREHENSIVE METABOLIC PANEL
ALT: 15 U/L (ref 0–44)
AST: 21 U/L (ref 15–41)
Albumin: 3.4 g/dL — ABNORMAL LOW (ref 3.5–5.0)
Alkaline Phosphatase: 159 U/L — ABNORMAL HIGH (ref 38–126)
Anion gap: 8 (ref 5–15)
BILIRUBIN TOTAL: 0.6 mg/dL (ref 0.3–1.2)
BUN: 21 mg/dL (ref 8–23)
CO2: 27 mmol/L (ref 22–32)
CREATININE: 1.47 mg/dL — AB (ref 0.44–1.00)
Calcium: 8.4 mg/dL — ABNORMAL LOW (ref 8.9–10.3)
Chloride: 104 mmol/L (ref 98–111)
GFR, EST AFRICAN AMERICAN: 36 mL/min — AB (ref >60)
GFR, EST NON AFRICAN AMERICAN: 31 mL/min — AB (ref >60)
Glucose, Bld: 92 mg/dL (ref 70–99)
POTASSIUM: 4.2 mmol/L (ref 3.5–5.1)
Sodium: 139 mmol/L (ref 135–145)
TOTAL PROTEIN: 6.8 g/dL (ref 6.5–8.1)

## 2017-11-18 LAB — CBC WITH DIFFERENTIAL/PLATELET
Basophils Absolute: 0 10*3/uL (ref 0.0–0.1)
Basophils Relative: 0 %
EOS ABS: 0.2 10*3/uL (ref 0.0–0.7)
Eosinophils Relative: 5 %
HCT: 29.4 % — ABNORMAL LOW (ref 36.0–46.0)
HEMOGLOBIN: 9.7 g/dL — AB (ref 12.0–15.0)
Lymphocytes Relative: 30 %
Lymphs Abs: 1.4 10*3/uL (ref 0.7–4.0)
MCH: 29.6 pg (ref 26.0–34.0)
MCHC: 33 g/dL (ref 30.0–36.0)
MCV: 89.6 fL (ref 78.0–100.0)
Monocytes Absolute: 0.4 10*3/uL (ref 0.1–1.0)
Monocytes Relative: 8 %
NEUTROS PCT: 57 %
Neutro Abs: 2.6 10*3/uL (ref 1.7–7.7)
Platelets: 135 10*3/uL — ABNORMAL LOW (ref 150–400)
RBC: 3.28 MIL/uL — AB (ref 3.87–5.11)
RDW: 16.9 % — ABNORMAL HIGH (ref 11.5–15.5)
WBC: 4.6 10*3/uL (ref 4.0–10.5)

## 2017-11-18 MED ORDER — SODIUM CHLORIDE 0.9 % IV SOLN
1920.0000 mg/m2 | INTRAVENOUS | Status: DC
  Administered 2017-11-18: 3100 mg via INTRAVENOUS
  Filled 2017-11-18: qty 52

## 2017-11-18 MED ORDER — DEXAMETHASONE SODIUM PHOSPHATE 10 MG/ML IJ SOLN
10.0000 mg | Freq: Once | INTRAMUSCULAR | Status: AC
  Administered 2017-11-18: 10 mg via INTRAVENOUS
  Filled 2017-11-18: qty 1

## 2017-11-18 MED ORDER — ONDANSETRON 8 MG PO TBDP
8.0000 mg | ORAL_TABLET | Freq: Once | ORAL | Status: AC
  Administered 2017-11-18: 8 mg via ORAL
  Filled 2017-11-18: qty 1

## 2017-11-18 MED ORDER — SODIUM CHLORIDE 0.9 % IV SOLN
Freq: Once | INTRAVENOUS | Status: AC
  Administered 2017-11-18: 10:00:00 via INTRAVENOUS

## 2017-11-18 MED ORDER — SODIUM CHLORIDE 0.9 % IV SOLN: Freq: Once | INTRAVENOUS | Status: DC

## 2017-11-18 MED ORDER — SODIUM CHLORIDE 0.9 % IV SOLN
50.0000 mg/m2 | Freq: Once | INTRAVENOUS | Status: AC
  Administered 2017-11-18: 81.7 mg via INTRAVENOUS
  Filled 2017-11-18: qty 19

## 2017-11-18 MED ORDER — DRONABINOL 2.5 MG PO CAPS: 2.5000 mg | ORAL_CAPSULE | Freq: Two times a day (BID) | ORAL | 0 refills | Status: DC

## 2017-11-18 MED ORDER — LEUCOVORIN CALCIUM INJECTION 350 MG
700.0000 mg | Freq: Once | INTRAMUSCULAR | Status: AC
  Administered 2017-11-18: 700 mg via INTRAVENOUS
  Filled 2017-11-18: qty 35

## 2017-11-18 NOTE — Progress Notes (Signed)
[place], [place] [id number]   CLINIC:  Medical Oncology/Hematology  PCP:  Fayrene Helper, MD [address] [place] [address] [phone]   REASON FOR VISIT:  Follow-up for metastatic pancreatic cancer to the liver  CURRENT THERAPY: Infusional 5FU and liposomal Lrinotecan  BRIEF ONCOLOGIC HISTORY:    Pancreatic cancer metastasized to liver ([place])   03/10/2014 Initial Diagnosis    Pancreatic cancer metastasized to liver      03/22/2014 - 03/05/2016 Chemotherapy    Abraxane/Gemzar days 1, 8, every 28 days.  Day 15 was cancelled due to leukopenia and thrombocytopenia on day 15 cycle 1.      05/24/2014 Treatment Plan Change    Day 8 of cycle 3 is held with ANC of 1.1      06/05/2014 Imaging    CT C/A/P . Interval decrease in size of the pancreatic tail mass.Improved hepatic metastatic disease. No new lesions. No CT findings for metastatic disease involving the chest.      08/09/2014 Tumor Marker    CA 19-9= 33 (WNL)      10/26/2014 Imaging    MRI- Continued interval decrease in size of the hepatic metastatic lesions and no new lesions are identified. Continued decrease in size of the pancreatic tail lesion.      01/03/2015 Tumor Marker    Results for YARENI, CREPS (MRN [id number]) as of 01/18/2015 12:52  01/03/2015 10:00 CA 19-9: 14       01/17/2015 Imaging    MRI- Response to therapy of hepatic metastasis.  Similar size of a pancreatic tail lesion.      03/20/2015 Imaging    MRI-L spine- Severe disc space narrowing at L2-L3, with endplate reactive changes. Large disc extrusion into the ventral epidural space,central to the RIGHT with a cephalad migrated free fragment. Additional Large disc extrusion into the retroperitone...      03/22/2015 Imaging    CT pelvis- No evidence for metastatic disease within the pelvis.      07/24/2015 Imaging    MRI abd- Continued response to therapy, with no residual detectable  liver metastases. No new sites of metastatic disease in the abdomen.       08/15/2015 Code Status     She confirms desire for DNR status.      12/21/2015 Imaging    MRI liver- Severe image degradation due to motion artifact reducing diagnostic sensitivity and specificity. Reduce conspicuity of the pancreatic tail lesion suggesting further improvement. The original liver lesions have resolved.      03/05/2016 Treatment Plan Change    Chemotherapy holiday/Break after 2 years worth of treatment      04/07/2016 Imaging    CT chest- When compared to recent chest CT, new minimally displaced anterior left sixth rib fracture. Slight increase in subcarinal adenopathy      04/28/2016 Imaging    Bone density- BMD as determined from Femur Neck Right is 0.705 g/cm2 with a T-Score of -2.4. This patient is considered osteopenic according to [facility]) criteria. Compared with the prior study on 02/23/2013, the BMD of the lumbar spine/rt. femoral neck show a statistically significant decrease.       05/23/2016 Imaging    MRI abd- 1. Exam is significantly degraded by patient respiratory motion. Consider follow-up exams with CT abdomen with without contrast per pancreatic protocol. 2. Fullness in tail of pancreas is more prominent and potentially increased in size. Recommend close attention on  follow-up. (Consider CT as above) 3. No explanation for back pain.      09/11/2016 Imaging    MRI pancreas- Interval progression of pancreatic tail lesion. New differential perfusion right liver with areas of heterogeneity. Underlying metastatic disease in this region not excluded.      09/11/2016 Progression    MRi in conjunction with rising CA 19-9 are indicative of relapse of disease.      01/09/2017 Progression    CT C/A/P: 2.1 x 2.9 cm lesion in the pancreatic tail and abutting the splenic hilum, corresponding to known primary pancreatic neoplasm,  mildly increased.  Scattered small hypoenhancing lesions in the liver measuring up to 10 mm, approximately 8-10 in number, suspicious for hepatic metastases.  No findings specific for metastatic disease in the chest.  Additional ancillary findings as above.       01/27/2017 - 10/20/2017 Chemotherapy    The patient had gemcitabine (GEMZAR) 1,292 mg in sodium chloride 0.9 % 250 mL chemo infusion, 800 mg/m2 = 1,292 mg, Intravenous,  Once, 9 of 12 cycles Administration: 1,292 mg (01/28/2017), 1,292 mg (02/11/2017), 1,292 mg (02/25/2017), 1,292 mg (03/11/2017), 1,292 mg (03/25/2017), 1,292 mg (04/08/2017), 1,292 mg (04/22/2017), 1,292 mg (04/29/2017), 1,292 mg (05/20/2017), 1,292 mg (06/03/2017), 1,292 mg (06/24/2017), 1,292 mg (07/08/2017), 1,292 mg (07/29/2017), 1,292 mg (08/12/2017), 1,292 mg (08/26/2017), 1,292 mg (09/09/2017), 1,292 mg (09/23/2017), 1,292 mg (10/07/2017)  for chemotherapy treatment.       10/20/2017 -  Chemotherapy    The patient had leucovorin 700 mg in dextrose 5 % 250 mL infusion, 644 mg, Intravenous,  Once, 3 of 4 cycles Administration: 700 mg (10/21/2017), 700 mg (11/04/2017), 700 mg (11/18/2017) ondansetron (ZOFRAN) 8 mg in sodium chloride 0.9 % 50 mL IVPB, , Intravenous,  Once, 2 of 3 cycles Administration:  (11/04/2017) fluorouracil (ADRUCIL) 3,850 mg in sodium chloride 0.9 % 73 mL chemo infusion, 2,400 mg/m2 = 3,850 mg, Intravenous, 1 Day/Dose, 3 of 4 cycles Dose modification: 1,920 mg/m2 (original dose 2,400 mg/m2, Cycle 2, Reason: Provider Judgment, Comment: mucisitis) Administration: 3,850 mg (10/21/2017), 3,100 mg (11/04/2017), 3,100 mg (11/18/2017) irinotecan LIPOSOME (ONIVYDE) 81.7 mg in sodium chloride 0.9 % 500 mL chemo infusion, 50 mg/m2 = 81.7 mg (100 % of original dose 50 mg/m2), Intravenous, Once, 3 of 4 cycles Dose modification: 50 mg/m2 (original dose 50 mg/m2, Cycle 1, Reason: Provider Judgment), 50 mg/m2 (original dose 50 mg/m2, Cycle 2, Reason: Provider  Judgment) Administration: 81.7 mg (10/21/2017), 81.7 mg (11/04/2017), 81.7 mg (11/18/2017)  for chemotherapy treatment.         CANCER STAGING: Cancer Staging Pancreatic cancer metastasized to liver [facility]) Staging form: Pancreas, AJCC 7th Edition - Clinical: Stage IV (T3, N1, M1) - Signed by Baird Cancer, PA-C on 03/16/2014 - Pathologic: No stage assigned - Unsigned    INTERVAL HISTORY:  Ms. Kopf 84 y.o. female returns for routine follow-up for metastatic pancreatic cancer to the liver and consideration for next cycle of chemotherapy. Patient complains of weakness after getting the pump. Has diarrhea 2-3 times a week. Imodium stops the diarrhea. She report no mouth sores this time. Patient ran out of her marrinol medication and needs a refill. Patient denies vomiting but does has nausea occasionally. Denies any new pains. Denies fever or infection.  Overall, she tells me she has been feeling pretty well. Energy levels 50%; appetite 50%. Overall, she feels ready for next cycle of chemo today.     REVIEW OF SYSTEMS:  Review of Systems  Constitutional: Positive for fatigue.  HENT:  Negative.   Eyes: Negative.   Respiratory: Negative.   Cardiovascular: Negative.   Gastrointestinal: Positive for constipation and nausea.  Endocrine: Negative.   Genitourinary: Negative.    Skin: Negative.   Neurological: Positive for extremity weakness.  Hematological: Negative.   Psychiatric/Behavioral: Negative.      PAST MEDICAL/SURGICAL HISTORY:  Past Medical History:  Diagnosis Date  . Anemia due to antineoplastic chemotherapy 09/12/2015   Started Aranesp 500 mcg on 09/12/2015  . Anxiety   . Chronic diarrhea   . Depression   . DNR (do not resuscitate) 08/16/2015  . Erosive esophagitis   . GERD (gastroesophageal reflux disease)   . Hx of adenomatous colonic polyps    tubular adenomas, last found in 2008  . Hyperplastic colon polyp 03/19/10   tcs by Dr. Gala Romney  . Hypertension 20 years    . Kidney stone    hx/ crushed   . Opioid contract exists 04/18/2015   With Dr. Moshe Cipro  . Pancreatic cancer ([place]) 02/2014  . Pancreatic cancer metastasized to liver ([place]) 03/10/2014  . Schatzki's ring 08/26/10   Last dilated on EGD by Dr. Trevor Iha [facility], linear gastric erosions, BI hemigastrectomy   Past Surgical History:  Procedure Laterality Date  . BREAST LUMPECTOMY Left   . bunion removal     from both feet   . CHOLECYSTECTOMY  1965   . COLONOSCOPY  03/19/2010   DR Gala Romney,, normal TI, pancolonic diverticula, random colon bx neg., hyperplastic polyps removed  . ESOPHAGOGASTRODUODENOSCOPY  11/22/2003   DR Gala Romney, erosive RE, Billroth I  . ESOPHAGOGASTRODUODENOSCOPY  08/26/10   Dr. Gala Romney- moderate severe ERE, Scahtzki ring s/p dilation, Billroth I, linear gastric erosions, bx-gastric xanthelasma  . LEFT SHOULDER SURGERY  2009   DR HARRISON  . ORIF ANKLE FRACTURE Right 05/22/2013   Procedure: OPEN REDUCTION INTERNAL FIXATION (ORIF) RIGHT ANKLE FRACTURE;  Surgeon: Sanjuana Kava, MD;  Location: [facility];  Service: Orthopedics;  Laterality: Right;  . stomach ulcer  50 years ago    had some of her stomach removed      SOCIAL HISTORY:  Social History   Socioeconomic History  . Marital status: Widowed    Spouse name: Not on file  . Number of children: 2  . Years of education: Not on file  . Highest education level: Not on file  Occupational History  . Occupation: retired from Estate manager/land agent: RETIRED  Social Needs  . Financial resource strain: Not on file  . Food insecurity:    Worry: Not on file    Inability: Not on file  . Transportation needs:    Medical: Not on file    Non-medical: Not on file  Tobacco Use  . Smoking status: Current Some Day Smoker    Packs/day: 0.50    Years: 60.00    Pack years: 30.00    Types: Cigarettes    Last attempt to quit: 09/09/2016    Years since quitting: 1.1  . Smokeless tobacco: Never Used  Substance and Sexual Activity  . Alcohol  use: No  . Drug use: No  . Sexual activity: Never  Lifestyle  . Physical activity:    Days per week: Not on file    Minutes per session: Not on file  . Stress: Not on file  Relationships  . Social connections:    Talks on phone: Not on file    Gets together: Not on file    Attends religious service: Not on file  Active member of club or organization: Not on file    Attends meetings of clubs or organizations: Not on file    Relationship status: Not on file  . Intimate partner violence:    Fear of current or ex partner: Not on file    Emotionally abused: Not on file    Physically abused: Not on file    Forced sexual activity: Not on file  Other Topics Concern  . Not on file  Social History Narrative  . Not on file    FAMILY HISTORY:  Family History  Problem Relation Age of Onset  . Hypertension Mother   . Kidney failure Brother        on dialysis  . Diabetes Sister   . Diabetes Sister   . Hypertension Father   . Kidney failure Sister   . Kidney Stones Brother   . Breast cancer Son   . Gout Son   . Liver disease Neg Hx   . Colon cancer Neg Hx     CURRENT MEDICATIONS:  Outpatient Encounter Medications as of 11/18/2017  Medication Sig  . acetaminophen (TYLENOL) 500 MG tablet Take 500 mg by mouth every 6 (six) hours as needed for mild pain or moderate pain.   Marland Kitchen amLODipine (NORVASC) 10 MG tablet TAKE 1 TABLET BY MOUTH EVERY DAY  . clonazePAM (KLONOPIN) 0.5 MG tablet TAKE 1 TABLET BY MOUTH DAILY AS NEEDED FOR ANXIETY  . cycloSPORINE (RESTASIS) 0.05 % ophthalmic emulsion Place 1 drop into both eyes 2 (two) times daily.  . diphenhydrAMINE (BENADRYL) 50 MG tablet Take 1 tablet 1 hour prior to CT  . diphenoxylate-atropine (LOMOTIL) 2.5-0.025 MG tablet Take by mouth 4 (four) times daily as needed for diarrhea or loose stools.  Marland Kitchen dronabinol (MARINOL) 2.5 MG capsule Take 1 capsule (2.5 mg total) by mouth 2 (two) times daily before a meal.  . lidocaine-prilocaine (EMLA) cream  APPLY A QUARTER SIZE AMOUNT TO PORT SITE 1 HOURS PRIOR TO CHEMO DO NOT RUB IN COVER WITH PLASTIC WRAP  . metoprolol tartrate (LOPRESSOR) 25 MG tablet Take 1 tablet by mouth 2 (two) times daily.  . mirabegron ER (MYRBETRIQ) 25 MG TB24 tablet Take 1 tablet (25 mg total) by mouth daily.  . mirtazapine (REMERON) 7.5 MG tablet Take 1 tablet (7.5 mg total) by mouth at bedtime.  Marland Kitchen olmesartan (BENICAR) 20 MG tablet Take 1 tablet (20 mg total) by mouth daily.  Marland Kitchen oxybutynin (DITROPAN-XL) 5 MG 24 hr tablet TK 1 T PO QD  . pantoprazole (PROTONIX) 20 MG tablet TAKE 1 TABLET(20 MG) BY MOUTH DAILY  . potassium chloride (K-DUR) 10 MEQ tablet Take 1 tablet (10 mEq total) by mouth 2 (two) times daily.  . predniSONE (DELTASONE) 50 MG tablet Take 1 tablet 13 hours, 7 hours and 1 hour prior to CT  . traMADol (ULTRAM) 50 MG tablet One tablet once daily, as needed, for uncontrolled pain  . [DISCONTINUED] dronabinol (MARINOL) 2.5 MG capsule Take 1 capsule (2.5 mg total) by mouth 2 (two) times daily before a meal.  . [DISCONTINUED] prochlorperazine (COMPAZINE) 10 MG tablet Take 1 tablet (10 mg total) by mouth every 6 (six) hours as needed (Nausea or vomiting).   Facility-Administered Encounter Medications as of 11/18/2017  Medication  . dexamethasone (DECADRON) 10 mg in sodium chloride 0.9 % 50 mL IVPB    ALLERGIES:  Allergies  Allergen Reactions  . Iohexol Hives and Shortness Of Breath    PATIENT HAD TO BE TAKEN TO ED, C/O  WHELPS, HIVES, DIFFICULTY BREATHING. PATIENT GIVEN IV CONTRAST AFTER 13 HOUR PRE MEDS ON 01/09/2017 WITH NO REACTION NOTED. On 10/19/17 patient had reaction despite premedication.    Marland Kitchen Aciphex [Rabeprazole Sodium] Other (See Comments)    unknown  . Amlodipine Besylate-Valsartan     Rash to high-dose (5/320)  . Esomeprazole Magnesium   . Omeprazole Other (See Comments)    Patient states "medication didn't work"  . Ciprofloxacin Rash  . Penicillins Swelling and Rash    Has patient had a PCN  reaction causing immediate rash, facial/tongue/throat swelling, SOB or lightheadedness with hypotension: Yes Has patient had a PCN reaction causing severe rash involving mucus membranes or skin necrosis: Yes Has patient had a PCN reaction that required hospitalization: No Has patient had a PCN reaction occurring within the last 10 years: yes If all of the above answers are "NO", then may proceed with Cephalosporin use.      PHYSICAL EXAM:  ECOG Performance status: 1  Vitals:   11/18/17 0946  BP: (!) 169/62  Pulse: (!) 49  Resp: 18  Temp: 98.1 F (36.7 C)  SpO2: 99%   Filed Weights   11/18/17 0946  Weight: 123 lb 8 oz (56 kg)    Physical Exam   LABORATORY DATA:  I have reviewed the labs as listed.  CBC    Component Value Date/Time   WBC 4.6 11/18/2017 0858   RBC 3.28 (L) 11/18/2017 0858   HGB 9.7 (L) 11/18/2017 0858   HCT 29.4 (L) 11/18/2017 0858   PLT 135 (L) 11/18/2017 0858   MCV 89.6 11/18/2017 0858   MCH 29.6 11/18/2017 0858   MCHC 33.0 11/18/2017 0858   RDW 16.9 (H) 11/18/2017 0858   LYMPHSABS 1.4 11/18/2017 0858   MONOABS 0.4 11/18/2017 0858   EOSABS 0.2 11/18/2017 0858   BASOSABS 0.0 11/18/2017 0858   CMP Latest Ref Rng & Units 11/18/2017 11/04/2017 10/21/2017  Glucose 70 - 99 mg/dL 92 109(H) 110(H)  BUN 8 - 23 mg/dL 21 21 25(H)  Creatinine 0.44 - 1.00 mg/dL 1.47(H) 1.24(H) 1.35(H)  Sodium 135 - 145 mmol/L 139 142 140  Potassium 3.5 - 5.1 mmol/L 4.2 3.9 3.8  Chloride 98 - 111 mmol/L 104 107 104  CO2 22 - 32 mmol/L _0 Calcium 8.9 - 10.3 mg/dL 8.4(L) 8.7(L) 8.5(L)  Total Protein 6.5 - 8.1 g/dL 6.8 7.1 7.4  Total Bilirubin 0.3 - 1.2 mg/dL 0.6 0.5 0.7  Alkaline Phos 38 - 126 U/L 159(H) 190(H) 143(H)  AST 15 - 41 U/L _1 ALT 0 - 44 U/L _2 ASSESSMENT & PLAN:   Pancreatic cancer metastasized to liver ([place]) 1.  Metastatic pancreatic cancer to the liver: -Foundation 1 testing shows MS and TMB cannot be determined, KRAS G12V,  TP 53 mutation - Gemcitabine and Abraxane from November 2015 through August 2018 with progression -Gemcitabine 800 mg/m (day 1 and day 15) and Tarceva 100 mg daily q. 28 days started on 01/28/2017 -CT scan done on 07/13/2017 shows stable liver disease. CA 19-9 was on 06/24/2017 at 129.  CA-19-9 at last visit went up to 171. - I discussed the latest CT scan reports which showed progression in the liver lesions which are very subtle.  There is progression in the tail of the pancreas lesion measuring 27 x 27 mm.  Even though the progression is very small, her CA 19-9 is continuing to go up, last one at  171.  Hence I have recommended a change in her treatment.  Gemcitabine and Tarceva were discontinued. -We will consider testing BRCA1/2 and PALB2 mutation.  We will also consider testing MSI. - First cycle of infusional 5-FU and liposomal Irinotecan started on 10/21/2017, Onivyde started at a decreased dose of 50 mg/m. - She developed mucositis after cycle 1.  I have cut back on infusional 5-FU dose by 20%.  She tolerated cycle 2 very well.  Her energy levels also improved.  She will proceed with cycle 3 today at the same dose level.  I plan to repeat CT scans after 4-6 cycles.  She will come back in 2 weeks for follow-up and for her next cycle.  2.  Weight loss: She initially lost 6-7 pounds.  We have started her on Marinol 2.5 mg twice daily in May.  It really helped her appetite.  We will give her another prescription today.      Orders placed this encounter:  Orders Placed This Encounter  Procedures  . CBC with Differential/Platelet  . Comprehensive metabolic panel      Derek Jack, MD [place] [phone]

## 2017-11-18 NOTE — Assessment & Plan Note (Signed)
1.  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 28 days started on 01/28/2017 -CT scan done on 07/13/2017 shows stable liver disease. CA 19-9 was on 06/24/2017 at 129.  CA-19-9 at last visit went up to 171. - I discussed the latest CT scan reports which showed progression in the liver lesions which are very subtle.  There is progression in the tail of the pancreas lesion measuring 27 x 27 mm.  Even though the progression is very small, her CA 19-9 is continuing to go up, last one at 171.  Hence I have recommended a change in her treatment.  Gemcitabine and Tarceva were discontinued. -We will consider testing BRCA1/2 and PALB2 mutation.  We will also consider testing MSI. - First cycle of infusional 5-FU and liposomal Irinotecan started on 10/21/2017, Onivyde started at a decreased dose of 50 mg/m. - She developed mucositis after cycle 1.  I have cut back on infusional 5-FU dose by 20%.  She tolerated cycle 2 very well.  Her energy levels also improved.  She will proceed with cycle 3 today at the same dose level.  I plan to repeat CT scans after 4-6 cycles.  She will come back in 2 weeks for follow-up and for her next cycle.  2.  Weight loss: She initially lost 6-7 pounds.  We have started her on Marinol 2.5 mg twice daily in May.  It really helped her appetite.  We will give her another prescription today.

## 2017-11-18 NOTE — Progress Notes (Signed)
Tolerated infusions w/o adverse reaction.  Alert, in no distress.  VSS.  Discharged ambulatory w/ambulatory pump infusing.

## 2017-11-19 LAB — CANCER ANTIGEN 19-9: CA 19-9: 263 U/mL — ABNORMAL HIGH (ref 0–35)

## 2017-11-20 ENCOUNTER — Other Ambulatory Visit: Payer: Self-pay

## 2017-11-20 ENCOUNTER — Other Ambulatory Visit (HOSPITAL_COMMUNITY): Payer: Self-pay | Admitting: Pharmacist

## 2017-11-20 ENCOUNTER — Other Ambulatory Visit (HOSPITAL_COMMUNITY): Payer: Self-pay | Admitting: *Deleted

## 2017-11-20 ENCOUNTER — Inpatient Hospital Stay (HOSPITAL_COMMUNITY): Payer: Medicare HMO

## 2017-11-20 ENCOUNTER — Encounter (HOSPITAL_COMMUNITY): Payer: Self-pay

## 2017-11-20 ENCOUNTER — Telehealth (HOSPITAL_COMMUNITY): Payer: Self-pay | Admitting: *Deleted

## 2017-11-20 VITALS — BP 120/45 | HR 50 | Temp 97.7°F | Resp 20

## 2017-11-20 DIAGNOSIS — Z5111 Encounter for antineoplastic chemotherapy: Secondary | ICD-10-CM | POA: Diagnosis not present

## 2017-11-20 DIAGNOSIS — C259 Malignant neoplasm of pancreas, unspecified: Secondary | ICD-10-CM | POA: Diagnosis not present

## 2017-11-20 DIAGNOSIS — N390 Urinary tract infection, site not specified: Secondary | ICD-10-CM

## 2017-11-20 DIAGNOSIS — R350 Frequency of micturition: Secondary | ICD-10-CM

## 2017-11-20 DIAGNOSIS — C787 Secondary malignant neoplasm of liver and intrahepatic bile duct: Secondary | ICD-10-CM | POA: Diagnosis not present

## 2017-11-20 LAB — URINALYSIS, ROUTINE W REFLEX MICROSCOPIC
BILIRUBIN URINE: NEGATIVE
Glucose, UA: NEGATIVE mg/dL
HGB URINE DIPSTICK: NEGATIVE
KETONES UR: NEGATIVE mg/dL
NITRITE: NEGATIVE
PROTEIN: 100 mg/dL — AB
Specific Gravity, Urine: 1.011 (ref 1.005–1.030)
pH: 6 (ref 5.0–8.0)

## 2017-11-20 MED ORDER — SULFAMETHOXAZOLE-TRIMETHOPRIM 800-160 MG PO TABS: 1.0000 | ORAL_TABLET | Freq: Two times a day (BID) | ORAL | 0 refills | Status: DC

## 2017-11-20 MED ORDER — HEPARIN SOD (PORK) LOCK FLUSH 100 UNIT/ML IV SOLN
500.0000 [IU] | Freq: Once | INTRAVENOUS | Status: AC
  Administered 2017-11-20: 500 [IU] via INTRAVENOUS

## 2017-11-20 MED ORDER — SODIUM CHLORIDE 0.9% FLUSH
10.0000 mL | INTRAVENOUS | Status: DC | PRN
  Administered 2017-11-20: 10 mL via INTRAVENOUS
  Filled 2017-11-20: qty 10

## 2017-11-20 NOTE — Progress Notes (Signed)
Patient tolerated 66fu infusion without any problems. Patient complained of frequent urination. UA obtained. HR 48 and 50. Patient taking Metoprolol 25mg  BID. I sent an inbasket to Dr. Moshe Cipro PCP regarding her heart rate and metoprolol. Awaiting message back from her regarding that.   Pump removed. Portacath flushed and deaccessed. Portacath flushed with 7ml NS and 500U/4ml Heparin and needle removed intact.  Procedure tolerated well and without incident.

## 2017-11-23 ENCOUNTER — Other Ambulatory Visit: Payer: Self-pay | Admitting: Family Medicine

## 2017-11-23 ENCOUNTER — Telehealth (HOSPITAL_COMMUNITY): Payer: Self-pay | Admitting: *Deleted

## 2017-11-23 NOTE — Telephone Encounter (Signed)
Message left on patient's son's vm Barbaraann Rondo) regarding Dr. Griffin Dakin instructions to reduce Metoprolol to once a day due to low heart rate. I asked son to call me back to let me know that he received the message.

## 2017-11-25 ENCOUNTER — Telehealth: Payer: Self-pay | Admitting: Family Medicine

## 2017-11-25 NOTE — Telephone Encounter (Signed)
Re message that patient has low heart rate in the 40's please do reduce the metoprolol to one tablet daily

## 2017-12-02 ENCOUNTER — Encounter (HOSPITAL_COMMUNITY): Payer: Self-pay | Admitting: Hematology

## 2017-12-02 ENCOUNTER — Encounter (HOSPITAL_COMMUNITY): Payer: Self-pay

## 2017-12-02 ENCOUNTER — Ambulatory Visit (HOSPITAL_COMMUNITY): Payer: Medicare HMO | Admitting: Hematology

## 2017-12-02 ENCOUNTER — Inpatient Hospital Stay (HOSPITAL_COMMUNITY): Payer: Medicare HMO

## 2017-12-02 ENCOUNTER — Inpatient Hospital Stay (HOSPITAL_BASED_OUTPATIENT_CLINIC_OR_DEPARTMENT_OTHER): Payer: Medicare HMO | Admitting: Hematology

## 2017-12-02 VITALS — BP 148/70 | HR 64 | Temp 97.7°F | Resp 18 | Wt 123.4 lb

## 2017-12-02 DIAGNOSIS — R69 Illness, unspecified: Secondary | ICD-10-CM | POA: Diagnosis not present

## 2017-12-02 DIAGNOSIS — Z5111 Encounter for antineoplastic chemotherapy: Secondary | ICD-10-CM | POA: Diagnosis not present

## 2017-12-02 DIAGNOSIS — C259 Malignant neoplasm of pancreas, unspecified: Secondary | ICD-10-CM

## 2017-12-02 DIAGNOSIS — C787 Secondary malignant neoplasm of liver and intrahepatic bile duct: Secondary | ICD-10-CM

## 2017-12-02 DIAGNOSIS — I10 Essential (primary) hypertension: Secondary | ICD-10-CM | POA: Diagnosis not present

## 2017-12-02 DIAGNOSIS — M79651 Pain in right thigh: Secondary | ICD-10-CM | POA: Diagnosis not present

## 2017-12-02 DIAGNOSIS — R200 Anesthesia of skin: Secondary | ICD-10-CM | POA: Diagnosis not present

## 2017-12-02 DIAGNOSIS — R634 Abnormal weight loss: Secondary | ICD-10-CM | POA: Diagnosis not present

## 2017-12-02 DIAGNOSIS — F17210 Nicotine dependence, cigarettes, uncomplicated: Secondary | ICD-10-CM

## 2017-12-02 DIAGNOSIS — Z803 Family history of malignant neoplasm of breast: Secondary | ICD-10-CM | POA: Diagnosis not present

## 2017-12-02 LAB — COMPREHENSIVE METABOLIC PANEL
ALT: 18 U/L (ref 0–44)
ANION GAP: 7 (ref 5–15)
AST: 23 U/L (ref 15–41)
Albumin: 3.6 g/dL (ref 3.5–5.0)
Alkaline Phosphatase: 178 U/L — ABNORMAL HIGH (ref 38–126)
BILIRUBIN TOTAL: 0.6 mg/dL (ref 0.3–1.2)
BUN: 36 mg/dL — AB (ref 8–23)
CALCIUM: 8.7 mg/dL — AB (ref 8.9–10.3)
CO2: 26 mmol/L (ref 22–32)
Chloride: 109 mmol/L (ref 98–111)
Creatinine, Ser: 1.4 mg/dL — ABNORMAL HIGH (ref 0.44–1.00)
GFR calc Af Amer: 38 mL/min — ABNORMAL LOW (ref >60)
GFR, EST NON AFRICAN AMERICAN: 33 mL/min — AB (ref >60)
Glucose, Bld: 112 mg/dL — ABNORMAL HIGH (ref 70–99)
POTASSIUM: 4 mmol/L (ref 3.5–5.1)
Sodium: 142 mmol/L (ref 135–145)
TOTAL PROTEIN: 7 g/dL (ref 6.5–8.1)

## 2017-12-02 LAB — CBC WITH DIFFERENTIAL/PLATELET
Basophils Absolute: 0 10*3/uL (ref 0.0–0.1)
Basophils Relative: 0 %
Eosinophils Absolute: 0.2 10*3/uL (ref 0.0–0.7)
Eosinophils Relative: 3 %
HEMATOCRIT: 29.6 % — AB (ref 36.0–46.0)
Hemoglobin: 9.6 g/dL — ABNORMAL LOW (ref 12.0–15.0)
LYMPHS PCT: 21 %
Lymphs Abs: 1 10*3/uL (ref 0.7–4.0)
MCH: 29.5 pg (ref 26.0–34.0)
MCHC: 32.4 g/dL (ref 30.0–36.0)
MCV: 91.1 fL (ref 78.0–100.0)
MONO ABS: 0.4 10*3/uL (ref 0.1–1.0)
MONOS PCT: 7 %
NEUTROS ABS: 3.3 10*3/uL (ref 1.7–7.7)
Neutrophils Relative %: 69 %
Platelets: 139 10*3/uL — ABNORMAL LOW (ref 150–400)
RBC: 3.25 MIL/uL — ABNORMAL LOW (ref 3.87–5.11)
RDW: 17.6 % — ABNORMAL HIGH (ref 11.5–15.5)
WBC: 4.9 10*3/uL (ref 4.0–10.5)

## 2017-12-02 MED ORDER — LEUCOVORIN CALCIUM INJECTION 350 MG
700.0000 mg | Freq: Once | INTRAMUSCULAR | Status: AC
  Administered 2017-12-02: 700 mg via INTRAVENOUS
  Filled 2017-12-02: qty 35

## 2017-12-02 MED ORDER — SODIUM CHLORIDE 0.9 % IV SOLN
1920.0000 mg/m2 | INTRAVENOUS | Status: DC
  Administered 2017-12-02: 3100 mg via INTRAVENOUS
  Filled 2017-12-02: qty 62

## 2017-12-02 MED ORDER — SODIUM CHLORIDE 0.9% FLUSH
10.0000 mL | INTRAVENOUS | Status: DC | PRN
  Administered 2017-12-02: 10 mL
  Filled 2017-12-02: qty 10

## 2017-12-02 MED ORDER — SODIUM CHLORIDE 0.9 % IV SOLN
Freq: Once | INTRAVENOUS | Status: AC
  Administered 2017-12-02: 11:00:00 via INTRAVENOUS

## 2017-12-02 MED ORDER — DEXAMETHASONE SODIUM PHOSPHATE 10 MG/ML IJ SOLN
INTRAMUSCULAR | Status: AC
  Filled 2017-12-02: qty 1

## 2017-12-02 MED ORDER — ONDANSETRON 8 MG PO TBDP
ORAL_TABLET | ORAL | Status: AC
  Filled 2017-12-02: qty 1

## 2017-12-02 MED ORDER — DEXAMETHASONE SODIUM PHOSPHATE 10 MG/ML IJ SOLN
10.0000 mg | Freq: Once | INTRAMUSCULAR | Status: AC
  Administered 2017-12-02: 10 mg via INTRAVENOUS

## 2017-12-02 MED ORDER — SODIUM CHLORIDE 0.9 % IV SOLN
50.0000 mg/m2 | Freq: Once | INTRAVENOUS | Status: AC
  Administered 2017-12-02: 81.7 mg via INTRAVENOUS
  Filled 2017-12-02: qty 19

## 2017-12-02 MED ORDER — SODIUM CHLORIDE 0.9 % IV SOLN: Freq: Once | INTRAVENOUS | Status: DC

## 2017-12-02 MED ORDER — ONDANSETRON 8 MG PO TBDP
8.0000 mg | ORAL_TABLET | Freq: Once | ORAL | Status: AC
  Administered 2017-12-02: 8 mg via ORAL

## 2017-12-02 NOTE — Progress Notes (Signed)
Patient seen by oncologist with lab review and ok to treat with chemotherapy today.  Family at side.  Medications reviewed with patient and son for update.  Patient tolerated chemotherapy with no complaints voiced.  Port site clean and dry with no bruising or swelling noted.  Chemotherapy pump running with no alarms noted.  Tegaderm dressing intact.  VSs with discharge and left ambulatory with no s/s of distress noted.

## 2017-12-02 NOTE — Assessment & Plan Note (Signed)
1.  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 28 days started on 01/28/2017 -CT scan done on 07/13/2017 shows stable liver disease. CA 19-9 was on 06/24/2017 at 129.  CA-19-9 at last visit went up to 171. - I discussed the latest CT scan reports which showed progression in the liver lesions which are very subtle.  There is progression in the tail of the pancreas lesion measuring 27 x 27 mm.  Even though the progression is very small, her CA 19-9 is continuing to go up, last one at 171.  Hence I have recommended a change in her treatment.  Gemcitabine and Tarceva were discontinued. -We will consider testing BRCA1/2 and PALB2 mutation.  We will also consider testing MSI. - First cycle of infusional 5-FU and liposomal Irinotecan started on 10/21/2017, Onivyde started at a decreased dose of 50 mg/m. - We have cut back on infusional 5-FU dose by 20% after cycle 1 when she developed mucositis.  Since then she has been tolerating it very well.  Cycle 3 was on 11/18/2017.  Her CA-19-9 has come down to 263 (11/18/2017) from 316 (11/04/2017).  Her blood counts are adequate to proceed with cycle 4 today.  I plan to repeat CT scans after 6 cycles. -She is complaining of soreness in the right lateral thigh for the last 6 months.  We will obtain x-rays of the thigh.  I cannot palpate any mass.  2.  Weight loss: She is taking Marinol 2.5 mg twice daily.  Her weight has been stable around 123 for the past few visits.

## 2017-12-02 NOTE — Patient Instructions (Signed)
[facility] Discharge Instructions for Patients Receiving Chemotherapy  Today you received the following chemotherapy agents irinotecan, leucovorin, and adruicil.    If you develop nausea and vomiting that is not controlled by your nausea medication, call the clinic.   BELOW ARE SYMPTOMS THAT SHOULD BE REPORTED IMMEDIATELY:  *FEVER GREATER THAN 100.5 F  *CHILLS WITH OR WITHOUT FEVER  NAUSEA AND VOMITING THAT IS NOT CONTROLLED WITH YOUR NAUSEA MEDICATION  *UNUSUAL SHORTNESS OF BREATH  *UNUSUAL BRUISING OR BLEEDING  TENDERNESS IN MOUTH AND THROAT WITH OR WITHOUT PRESENCE OF ULCERS  *URINARY PROBLEMS  *BOWEL PROBLEMS  UNUSUAL RASH Items with * indicate a potential emergency and should be followed up as soon as possible.  Feel free to call the clinic should you have any questions or concerns. The clinic phone number is [phone].  Please show the [facility] at check-in to the Emergency Department and triage nurse.

## 2017-12-02 NOTE — Addendum Note (Signed)
Addended by: Glennie Isle on: 12/02/2017 01:10 PM   Modules accepted: Orders

## 2017-12-02 NOTE — Progress Notes (Signed)
[place], [place] [address]   CLINIC:  Medical Oncology/Hematology  PCP:  Fayrene Helper, MD [address] [place] [address] [phone]   REASON FOR VISIT:  Follow-up for pancreatic cancer to the liver  CURRENT THERAPY: Infusional 5FU and liposomal Irinotecan  BRIEF ONCOLOGIC HISTORY:    Pancreatic cancer metastasized to liver ([place])   03/10/2014 Initial Diagnosis    Pancreatic cancer metastasized to liver      03/22/2014 - 03/05/2016 Chemotherapy    Abraxane/Gemzar days 1, 8, every 28 days.  Day 15 was cancelled due to leukopenia and thrombocytopenia on day 15 cycle 1.      05/24/2014 Treatment Plan Change    Day 8 of cycle 3 is held with ANC of 1.1      06/05/2014 Imaging    CT C/A/P . Interval decrease in size of the pancreatic tail mass.Improved hepatic metastatic disease. No new lesions. No CT findings for metastatic disease involving the chest.      08/09/2014 Tumor Marker    CA 19-9= 33 (WNL)      10/26/2014 Imaging    MRI- Continued interval decrease in size of the hepatic metastatic lesions and no new lesions are identified. Continued decrease in size of the pancreatic tail lesion.      01/03/2015 Tumor Marker    Results for [name], [name] (MRN [id number]) as of 01/18/2015 12:52  01/03/2015 10:00 CA 19-9: 14       01/17/2015 Imaging    MRI- Response to therapy of hepatic metastasis.  Similar size of a pancreatic tail lesion.      03/20/2015 Imaging    MRI-L spine- Severe disc space narrowing at L2-L3, with endplate reactive changes. Large disc extrusion into the ventral epidural space,central to the RIGHT with a cephalad migrated free fragment. Additional Large disc extrusion into the retroperitone...      03/22/2015 Imaging    CT pelvis- No evidence for metastatic disease within the pelvis.      07/24/2015 Imaging    MRI abd- Continued response to therapy, with no residual detectable liver  metastases. No new sites of metastatic disease in the abdomen.       08/15/2015 Code Status     She confirms desire for DNR status.      12/21/2015 Imaging    MRI liver- Severe image degradation due to motion artifact reducing diagnostic sensitivity and specificity. Reduce conspicuity of the pancreatic tail lesion suggesting further improvement. The original liver lesions have resolved.      03/05/2016 Treatment Plan Change    Chemotherapy holiday/Break after 2 years worth of treatment      04/07/2016 Imaging    CT chest- When compared to recent chest CT, new minimally displaced anterior left sixth rib fracture. Slight increase in subcarinal adenopathy      04/28/2016 Imaging    Bone density- BMD as determined from Femur Neck Right is 0.705 g/cm2 with a T-Score of -2.4. This patient is considered osteopenic according to [facility]) criteria. Compared with the prior study on 02/23/2013, the BMD of the lumbar spine/rt. femoral neck show a statistically significant decrease.       05/23/2016 Imaging    MRI abd- 1. Exam is significantly degraded by patient respiratory motion. Consider follow-up exams with CT abdomen with without contrast per pancreatic protocol. 2. Fullness in tail of pancreas is more prominent and potentially increased in size. Recommend close attention on follow-up. (  Consider CT as above) 3. No explanation for back pain.      09/11/2016 Imaging    MRI pancreas- Interval progression of pancreatic tail lesion. New differential perfusion right liver with areas of heterogeneity. Underlying metastatic disease in this region not excluded.      09/11/2016 Progression    MRi in conjunction with rising CA 19-9 are indicative of relapse of disease.      01/09/2017 Progression    CT C/A/P: 2.1 x 2.9 cm lesion in the pancreatic tail and abutting the splenic hilum, corresponding to known primary pancreatic neoplasm, mildly increased.  Scattered  small hypoenhancing lesions in the liver measuring up to 10 mm, approximately 8-10 in number, suspicious for hepatic metastases.  No findings specific for metastatic disease in the chest.  Additional ancillary findings as above.       01/27/2017 - 10/20/2017 Chemotherapy    The patient had gemcitabine (GEMZAR) 1,292 mg in sodium chloride 0.9 % 250 mL chemo infusion, 800 mg/m2 = 1,292 mg, Intravenous,  Once, 9 of 12 cycles Administration: 1,292 mg (01/28/2017), 1,292 mg (02/11/2017), 1,292 mg (02/25/2017), 1,292 mg (03/11/2017), 1,292 mg (03/25/2017), 1,292 mg (04/08/2017), 1,292 mg (04/22/2017), 1,292 mg (04/29/2017), 1,292 mg (05/20/2017), 1,292 mg (06/03/2017), 1,292 mg (06/24/2017), 1,292 mg (07/08/2017), 1,292 mg (07/29/2017), 1,292 mg (08/12/2017), 1,292 mg (08/26/2017), 1,292 mg (09/09/2017), 1,292 mg (09/23/2017), 1,292 mg (10/07/2017)  for chemotherapy treatment.       10/20/2017 -  Chemotherapy    The patient had leucovorin 700 mg in dextrose 5 % 250 mL infusion, 644 mg, Intravenous,  Once, 4 of 4 cycles Administration: 700 mg (10/21/2017), 700 mg (11/04/2017), 700 mg (11/18/2017) ondansetron (ZOFRAN) 8 mg in sodium chloride 0.9 % 50 mL IVPB, , Intravenous,  Once, 3 of 3 cycles Administration:  (11/04/2017) fluorouracil (ADRUCIL) 3,850 mg in sodium chloride 0.9 % 73 mL chemo infusion, 2,400 mg/m2 = 3,850 mg, Intravenous, 1 Day/Dose, 4 of 4 cycles Dose modification: 1,920 mg/m2 (original dose 2,400 mg/m2, Cycle 2, Reason: Provider Judgment, Comment: mucisitis) Administration: 3,850 mg (10/21/2017), 3,100 mg (11/04/2017), 3,100 mg (11/18/2017) irinotecan LIPOSOME (ONIVYDE) 81.7 mg in sodium chloride 0.9 % 500 mL chemo infusion, 50 mg/m2 = 81.7 mg (100 % of original dose 50 mg/m2), Intravenous, Once, 4 of 4 cycles Dose modification: 50 mg/m2 (original dose 50 mg/m2, Cycle 1, Reason: Provider Judgment), 50 mg/m2 (original dose 50 mg/m2, Cycle 2, Reason: Provider Judgment) Administration: 81.7 mg  (10/21/2017), 81.7 mg (11/04/2017), 81.7 mg (11/18/2017)  for chemotherapy treatment.         CANCER STAGING: Cancer Staging Pancreatic cancer metastasized to liver [facility]) Staging form: Pancreas, AJCC 7th Edition - Clinical: Stage IV (T3, N1, M1) - Signed by Baird Cancer, PA-C on 03/16/2014 - Pathologic: No stage assigned - Unsigned    INTERVAL HISTORY:  [name] 84 y.o. female returns for routine follow-up for pancreatic cancer to the liver and consideration for next cycle of chemotherapy. Patient is here today with her son. She is doing well with treatment. She has not had any new mouth sores since last visit and her other ones are gone. She used her mouth wash which helped. She does have some numbness and her toes. She has a pain on her right thigh that has been there for 6 months. There is no bump or lump. No bruising or bleeding. Denies injuring her leg. She started having a ringing in her ears about 5 months ago as well.  Her appetite  Is decreasing and she  doesn't like the boosts or ensures. She is going to try something different this week to get extra calories. Denies any other pains. Denies any nausea, vomiting, or diarrhea.     REVIEW OF SYSTEMS:  Review of Systems  Neurological: Positive for numbness.  All other systems reviewed and are negative.    PAST MEDICAL/SURGICAL HISTORY:  Past Medical History:  Diagnosis Date  . Anemia due to antineoplastic chemotherapy 09/12/2015   Started Aranesp 500 mcg on 09/12/2015  . Anxiety   . Chronic diarrhea   . Depression   . DNR (do not resuscitate) 08/16/2015  . Erosive esophagitis   . GERD (gastroesophageal reflux disease)   . Hx of adenomatous colonic polyps    tubular adenomas, last found in 2008  . Hyperplastic colon polyp 03/19/10   tcs by Dr. Gala Romney  . Hypertension 20 years   . Kidney stone    hx/ crushed   . Opioid contract exists 04/18/2015   With Dr. Moshe Cipro  . Pancreatic cancer ([place]) 02/2014  . Pancreatic cancer  metastasized to liver ([place]) 03/10/2014  . Schatzki's ring 08/26/10   Last dilated on EGD by Dr. Trevor Iha [facility], linear gastric erosions, BI hemigastrectomy   Past Surgical History:  Procedure Laterality Date  . BREAST LUMPECTOMY Left   . bunion removal     from both feet   . CHOLECYSTECTOMY  1965   . COLONOSCOPY  03/19/2010   DR Gala Romney,, normal TI, pancolonic diverticula, random colon bx neg., hyperplastic polyps removed  . ESOPHAGOGASTRODUODENOSCOPY  11/22/2003   DR Gala Romney, erosive RE, Billroth I  . ESOPHAGOGASTRODUODENOSCOPY  08/26/10   Dr. Gala Romney- moderate severe ERE, Scahtzki ring s/p dilation, Billroth I, linear gastric erosions, bx-gastric xanthelasma  . LEFT SHOULDER SURGERY  2009   DR HARRISON  . ORIF ANKLE FRACTURE Right 05/22/2013   Procedure: OPEN REDUCTION INTERNAL FIXATION (ORIF) RIGHT ANKLE FRACTURE;  Surgeon: Sanjuana Kava, MD;  Location: [facility];  Service: Orthopedics;  Laterality: Right;  . stomach ulcer  50 years ago    had some of her stomach removed      SOCIAL HISTORY:  Social History   Socioeconomic History  . Marital status: Widowed    Spouse name: Not on file  . Number of children: 2  . Years of education: Not on file  . Highest education level: Not on file  Occupational History  . Occupation: retired from Estate manager/land agent: RETIRED  Social Needs  . Financial resource strain: Not on file  . Food insecurity:    Worry: Not on file    Inability: Not on file  . Transportation needs:    Medical: Not on file    Non-medical: Not on file  Tobacco Use  . Smoking status: Current Some Day Smoker    Packs/day: 0.50    Years: 60.00    Pack years: 30.00    Types: Cigarettes    Last attempt to quit: 09/09/2016    Years since quitting: 1.2  . Smokeless tobacco: Never Used  Substance and Sexual Activity  . Alcohol use: No  . Drug use: No  . Sexual activity: Never  Lifestyle  . Physical activity:    Days per week: Not on file    Minutes per session: Not on  file  . Stress: Not on file  Relationships  . Social connections:    Talks on phone: Not on file    Gets together: Not on file    Attends religious service: Not on  file    Active member of club or organization: Not on file    Attends meetings of clubs or organizations: Not on file    Relationship status: Not on file  . Intimate partner violence:    Fear of current or ex partner: Not on file    Emotionally abused: Not on file    Physically abused: Not on file    Forced sexual activity: Not on file  Other Topics Concern  . Not on file  Social History Narrative  . Not on file    FAMILY HISTORY:  Family History  Problem Relation Age of Onset  . Hypertension Mother   . Kidney failure Brother        on dialysis  . Diabetes Sister   . Diabetes Sister   . Hypertension Father   . Kidney failure Sister   . Kidney Stones Brother   . Breast cancer Son   . Gout Son   . Liver disease Neg Hx   . Colon cancer Neg Hx     CURRENT MEDICATIONS:  Outpatient Encounter Medications as of 12/02/2017  Medication Sig Note  . acetaminophen (TYLENOL) 500 MG tablet Take 500 mg by mouth every 6 (six) hours as needed for mild pain or moderate pain.    Marland Kitchen amLODipine (NORVASC) 10 MG tablet TAKE 1 TABLET BY MOUTH EVERY DAY   . clonazePAM (KLONOPIN) 0.5 MG tablet TAKE 1 TABLET BY MOUTH DAILY AS NEEDED FOR ANXIETY   . cycloSPORINE (RESTASIS) 0.05 % ophthalmic emulsion Place 1 drop into both eyes 2 (two) times daily.   . diphenhydrAMINE (BENADRYL) 50 MG tablet Take 1 tablet 1 hour prior to CT (Patient not taking: Reported on 11/20/2017)   . diphenoxylate-atropine (LOMOTIL) 2.5-0.025 MG tablet Take by mouth 4 (four) times daily as needed for diarrhea or loose stools.   Marland Kitchen dronabinol (MARINOL) 2.5 MG capsule Take 1 capsule (2.5 mg total) by mouth 2 (two) times daily before a meal. (Patient not taking: Reported on 12/02/2017)   . lidocaine-prilocaine (EMLA) cream APPLY A QUARTER SIZE AMOUNT TO PORT SITE 1 HOURS  PRIOR TO CHEMO DO NOT RUB IN COVER WITH PLASTIC WRAP   . metoprolol tartrate (LOPRESSOR) 25 MG tablet Take 1 tablet by mouth 2 (two) times daily. 12/02/2017: Taking once a day   . mirabegron ER (MYRBETRIQ) 25 MG TB24 tablet Take 1 tablet (25 mg total) by mouth daily.   . mirtazapine (REMERON) 7.5 MG tablet Take 1 tablet (7.5 mg total) by mouth at bedtime. (Patient not taking: Reported on 11/20/2017)   . olmesartan (BENICAR) 20 MG tablet Take 1 tablet (20 mg total) by mouth daily.   Marland Kitchen oxybutynin (DITROPAN-XL) 5 MG 24 hr tablet TK 1 T PO QD   . pantoprazole (PROTONIX) 20 MG tablet TAKE 1 TABLET(20 MG) BY MOUTH DAILY   . potassium chloride (K-DUR) 10 MEQ tablet Take 1 tablet (10 mEq total) by mouth 2 (two) times daily. (Patient not taking: Reported on 11/20/2017)   . predniSONE (DELTASONE) 50 MG tablet Take 1 tablet 13 hours, 7 hours and 1 hour prior to CT (Patient not taking: Reported on 11/20/2017)   . traMADol (ULTRAM) 50 MG tablet One tablet once daily, as needed, for uncontrolled pain   . [DISCONTINUED] prochlorperazine (COMPAZINE) 10 MG tablet Take 1 tablet (10 mg total) by mouth every 6 (six) hours as needed (Nausea or vomiting).    Facility-Administered Encounter Medications as of 12/02/2017  Medication  . dexamethasone (DECADRON) 10 mg in  sodium chloride 0.9 % 50 mL IVPB    ALLERGIES:  Allergies  Allergen Reactions  . Iohexol Hives and Shortness Of Breath    PATIENT HAD TO BE TAKEN TO ED, C/O WHELPS, HIVES, DIFFICULTY BREATHING. PATIENT GIVEN IV CONTRAST AFTER 13 HOUR PRE MEDS ON 01/09/2017 WITH NO REACTION NOTED. On 10/19/17 patient had reaction despite premedication.    Marland Kitchen Aciphex [Rabeprazole Sodium] Other (See Comments)    unknown  . Amlodipine Besylate-Valsartan     Rash to high-dose (5/320)  . Esomeprazole Magnesium   . Omeprazole Other (See Comments)    Patient states "medication didn't work"  . Ciprofloxacin Rash  . Penicillins Swelling and Rash    Has patient had a PCN  reaction causing immediate rash, facial/tongue/throat swelling, SOB or lightheadedness with hypotension: Yes Has patient had a PCN reaction causing severe rash involving mucus membranes or skin necrosis: Yes Has patient had a PCN reaction that required hospitalization: No Has patient had a PCN reaction occurring within the last 10 years: yes If all of the above answers are "NO", then may proceed with Cephalosporin use.      PHYSICAL EXAM:  ECOG Performance status: 1  VITAL SIGNS: BP: 169/64, P: 58, R: 18, TEMP: 97.7, 02: 100  Physical Exam  Constitutional: She is oriented to person, place, and time.  Cardiovascular: Normal rate, regular rhythm and normal heart sounds.  Pulmonary/Chest: Effort normal and breath sounds normal.  Neurological: She is alert and oriented to person, place, and time.  Skin: Skin is warm and dry.     LABORATORY DATA:  I have reviewed the labs as listed.  CBC    Component Value Date/Time   WBC 4.9 12/02/2017 0849   RBC 3.25 (L) 12/02/2017 0849   HGB 9.6 (L) 12/02/2017 0849   HCT 29.6 (L) 12/02/2017 0849   PLT 139 (L) 12/02/2017 0849   MCV 91.1 12/02/2017 0849   MCH 29.5 12/02/2017 0849   MCHC 32.4 12/02/2017 0849   RDW 17.6 (H) 12/02/2017 0849   LYMPHSABS 1.0 12/02/2017 0849   MONOABS 0.4 12/02/2017 0849   EOSABS 0.2 12/02/2017 0849   BASOSABS 0.0 12/02/2017 0849   CMP Latest Ref Rng & Units 12/02/2017 11/18/2017 11/04/2017  Glucose 70 - 99 mg/dL 112(H) 92 109(H)  BUN 8 - 23 mg/dL 36(H) 21 21  Creatinine 0.44 - 1.00 mg/dL 1.40(H) 1.47(H) 1.24(H)  Sodium 135 - 145 mmol/L 142 139 142  Potassium 3.5 - 5.1 mmol/L 4.0 4.2 3.9  Chloride 98 - 111 mmol/L 109 104 107  CO2 22 - 32 mmol/L '26 27 26  ' Calcium 8.9 - 10.3 mg/dL 8.7(L) 8.4(L) 8.7(L)  Total Protein 6.5 - 8.1 g/dL 7.0 6.8 7.1  Total Bilirubin 0.3 - 1.2 mg/dL 0.6 0.6 0.5  Alkaline Phos 38 - 126 U/L 178(H) 159(H) 190(H)  AST 15 - 41 U/L '23 21 31  ' ALT 0 - 44 U/L '18 15 23          ' ASSESSMENT & PLAN:   Pancreatic cancer metastasized to liver ([place]) 1.  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 28 days started on 01/28/2017 -CT scan done on 07/13/2017 shows stable liver disease. CA 19-9 was on 06/24/2017 at 129.  CA-19-9 at last visit went up to 171. -  I discussed the latest CT scan reports which showed progression in the liver lesions which are very subtle.  There is progression in the tail of the pancreas lesion measuring 27 x 27 mm.  Even though the progression is very small, her CA 19-9 is continuing to go up, last one at 171.  Hence I have recommended a change in her treatment.  Gemcitabine and Tarceva were discontinued. -We will consider testing BRCA1/2 and PALB2 mutation.  We will also consider testing MSI. - First cycle of infusional 5-FU and liposomal Irinotecan started on 10/21/2017, Onivyde started at a decreased dose of 50 mg/m. - We have cut back on infusional 5-FU dose by 20% after cycle 1 when she developed mucositis.  Since then she has been tolerating it very well.  Cycle 3 was on 11/18/2017.  Her CA-19-9 has come down to 263 (11/18/2017) from 316 (11/04/2017).  Her blood counts are adequate to proceed with cycle 4 today.  I plan to repeat CT scans after 6 cycles. -She is complaining of soreness in the right lateral thigh for the last 6 months.  We will obtain x-rays of the thigh.  I cannot palpate any mass.  2.  Weight loss: She is taking Marinol 2.5 mg twice daily.  Her weight has been stable around 123 for the past few visits.      Orders placed this encounter:  No orders of the defined types were placed in this encounter.     Derek Jack, MD [place] [phone]

## 2017-12-04 ENCOUNTER — Encounter (HOSPITAL_COMMUNITY): Payer: Self-pay

## 2017-12-04 ENCOUNTER — Inpatient Hospital Stay (HOSPITAL_COMMUNITY): Payer: Medicare HMO

## 2017-12-04 VITALS — BP 147/61 | HR 64 | Temp 97.9°F | Resp 18

## 2017-12-04 DIAGNOSIS — Z5111 Encounter for antineoplastic chemotherapy: Secondary | ICD-10-CM | POA: Diagnosis not present

## 2017-12-04 DIAGNOSIS — C787 Secondary malignant neoplasm of liver and intrahepatic bile duct: Secondary | ICD-10-CM | POA: Diagnosis not present

## 2017-12-04 DIAGNOSIS — C259 Malignant neoplasm of pancreas, unspecified: Secondary | ICD-10-CM

## 2017-12-04 MED ORDER — HEPARIN SOD (PORK) LOCK FLUSH 100 UNIT/ML IV SOLN
500.0000 [IU] | Freq: Once | INTRAVENOUS | Status: AC | PRN
  Administered 2017-12-04: 500 [IU]

## 2017-12-04 MED ORDER — SODIUM CHLORIDE 0.9% FLUSH
10.0000 mL | INTRAVENOUS | Status: DC | PRN
  Administered 2017-12-04: 10 mL
  Filled 2017-12-04: qty 10

## 2017-12-04 NOTE — Patient Instructions (Signed)
[facility] at [facility] Discharge Instructions  5FU pump discontinued with portacath flushed per protocol today. Follow-up as scheduled. Call clinic for any questions or concerns    Thank you for choosing [facility] at [facility] to provide your oncology and hematology care.  To afford each patient quality time with our provider, please arrive at least 15 minutes before your scheduled appointment time.   If you have a lab appointment with the Cancer Center please come in thru the  Main Entrance and check in at the main information desk  You need to re-schedule your appointment should you arrive 10 or more minutes late.  We strive to give you quality time with our providers, and arriving late affects you and other patients whose appointments are after yours.  Also, if you no show three or more times for appointments you may be dismissed from the clinic at the providers discretion.     Again, thank you for choosing [facility].  Our hope is that these requests will decrease the amount of time that you wait before being seen by our physicians.       _____________________________________________________________  Should you have questions after your visit to [facility], please contact our office at [phone] between the hours of 8:00 a.m. and 4:30 p.m.  Voicemails left after 4:00 p.m. will not be returned until the following business day.  For prescription refill requests, have your pharmacy contact our office and allow 72 hours.    Cancer Center Support Programs:   > Cancer Support Group  2nd Tuesday of the month 1pm-2pm, Journey Room   

## 2017-12-04 NOTE — Progress Notes (Signed)
[name] tolerated 5FU pump well with pump being discontinued today.Pt had some diarrhea this am but took Imodium as instructed without further issues at this time Port flushed easily per protocol then de-accessed. VSS Pt discharged via wheelchair in satisfactory condition accompanied by family member

## 2017-12-14 DIAGNOSIS — H353132 Nonexudative age-related macular degeneration, bilateral, intermediate dry stage: Secondary | ICD-10-CM | POA: Diagnosis not present

## 2017-12-14 DIAGNOSIS — Z961 Presence of intraocular lens: Secondary | ICD-10-CM | POA: Diagnosis not present

## 2017-12-14 DIAGNOSIS — H26491 Other secondary cataract, right eye: Secondary | ICD-10-CM | POA: Diagnosis not present

## 2017-12-16 ENCOUNTER — Encounter (HOSPITAL_COMMUNITY): Payer: Self-pay | Admitting: Hematology

## 2017-12-16 ENCOUNTER — Inpatient Hospital Stay (HOSPITAL_BASED_OUTPATIENT_CLINIC_OR_DEPARTMENT_OTHER): Payer: Medicare HMO | Admitting: Hematology

## 2017-12-16 ENCOUNTER — Inpatient Hospital Stay (HOSPITAL_COMMUNITY): Payer: Medicare HMO | Attending: Hematology

## 2017-12-16 ENCOUNTER — Inpatient Hospital Stay (HOSPITAL_COMMUNITY): Payer: Medicare HMO

## 2017-12-16 VITALS — BP 171/51 | HR 57 | Temp 97.8°F | Resp 18

## 2017-12-16 DIAGNOSIS — Z66 Do not resuscitate: Secondary | ICD-10-CM | POA: Diagnosis not present

## 2017-12-16 DIAGNOSIS — R197 Diarrhea, unspecified: Secondary | ICD-10-CM

## 2017-12-16 DIAGNOSIS — Z5111 Encounter for antineoplastic chemotherapy: Secondary | ICD-10-CM | POA: Diagnosis not present

## 2017-12-16 DIAGNOSIS — C252 Malignant neoplasm of tail of pancreas: Secondary | ICD-10-CM | POA: Insufficient documentation

## 2017-12-16 DIAGNOSIS — R69 Illness, unspecified: Secondary | ICD-10-CM | POA: Diagnosis not present

## 2017-12-16 DIAGNOSIS — C787 Secondary malignant neoplasm of liver and intrahepatic bile duct: Secondary | ICD-10-CM

## 2017-12-16 DIAGNOSIS — C259 Malignant neoplasm of pancreas, unspecified: Secondary | ICD-10-CM | POA: Diagnosis not present

## 2017-12-16 DIAGNOSIS — M79651 Pain in right thigh: Secondary | ICD-10-CM | POA: Insufficient documentation

## 2017-12-16 DIAGNOSIS — R944 Abnormal results of kidney function studies: Secondary | ICD-10-CM | POA: Diagnosis not present

## 2017-12-16 DIAGNOSIS — F17210 Nicotine dependence, cigarettes, uncomplicated: Secondary | ICD-10-CM | POA: Insufficient documentation

## 2017-12-16 DIAGNOSIS — Z9221 Personal history of antineoplastic chemotherapy: Secondary | ICD-10-CM | POA: Insufficient documentation

## 2017-12-16 DIAGNOSIS — M85851 Other specified disorders of bone density and structure, right thigh: Secondary | ICD-10-CM | POA: Diagnosis not present

## 2017-12-16 DIAGNOSIS — R634 Abnormal weight loss: Secondary | ICD-10-CM | POA: Diagnosis not present

## 2017-12-16 LAB — COMPREHENSIVE METABOLIC PANEL
ALT: 17 U/L (ref 0–44)
AST: 22 U/L (ref 15–41)
Albumin: 3.5 g/dL (ref 3.5–5.0)
Alkaline Phosphatase: 142 U/L — ABNORMAL HIGH (ref 38–126)
Anion gap: 6 (ref 5–15)
BILIRUBIN TOTAL: 0.6 mg/dL (ref 0.3–1.2)
BUN: 21 mg/dL (ref 8–23)
CO2: 27 mmol/L (ref 22–32)
Calcium: 8.7 mg/dL — ABNORMAL LOW (ref 8.9–10.3)
Chloride: 106 mmol/L (ref 98–111)
Creatinine, Ser: 1.33 mg/dL — ABNORMAL HIGH (ref 0.44–1.00)
GFR, EST AFRICAN AMERICAN: 40 mL/min — AB (ref >60)
GFR, EST NON AFRICAN AMERICAN: 35 mL/min — AB (ref >60)
Glucose, Bld: 106 mg/dL — ABNORMAL HIGH (ref 70–99)
POTASSIUM: 4.2 mmol/L (ref 3.5–5.1)
Sodium: 139 mmol/L (ref 135–145)
TOTAL PROTEIN: 7.2 g/dL (ref 6.5–8.1)

## 2017-12-16 LAB — CBC WITH DIFFERENTIAL/PLATELET
BASOS ABS: 0 10*3/uL (ref 0.0–0.1)
Basophils Relative: 0 %
Eosinophils Absolute: 0.2 10*3/uL (ref 0.0–0.7)
Eosinophils Relative: 4 %
HCT: 30.8 % — ABNORMAL LOW (ref 36.0–46.0)
Hemoglobin: 10.2 g/dL — ABNORMAL LOW (ref 12.0–15.0)
LYMPHS ABS: 1.5 10*3/uL (ref 0.7–4.0)
LYMPHS PCT: 31 %
MCH: 29.8 pg (ref 26.0–34.0)
MCHC: 33.1 g/dL (ref 30.0–36.0)
MCV: 90.1 fL (ref 78.0–100.0)
MONO ABS: 0.4 10*3/uL (ref 0.1–1.0)
MONOS PCT: 8 %
Neutro Abs: 2.8 10*3/uL (ref 1.7–7.7)
Neutrophils Relative %: 57 %
Platelets: 131 10*3/uL — ABNORMAL LOW (ref 150–400)
RBC: 3.42 MIL/uL — ABNORMAL LOW (ref 3.87–5.11)
RDW: 17.5 % — AB (ref 11.5–15.5)
WBC: 4.8 10*3/uL (ref 4.0–10.5)

## 2017-12-16 MED ORDER — SODIUM CHLORIDE 0.9% FLUSH
10.0000 mL | INTRAVENOUS | Status: DC | PRN
  Administered 2017-12-16: 10 mL
  Filled 2017-12-16: qty 10

## 2017-12-16 MED ORDER — SODIUM CHLORIDE 0.9 % IV SOLN
1920.0000 mg/m2 | INTRAVENOUS | Status: DC
  Administered 2017-12-16: 3100 mg via INTRAVENOUS
  Filled 2017-12-16: qty 50

## 2017-12-16 MED ORDER — LEUCOVORIN CALCIUM INJECTION 350 MG
700.0000 mg | Freq: Once | INTRAVENOUS | Status: AC
  Administered 2017-12-16: 700 mg via INTRAVENOUS
  Filled 2017-12-16: qty 35

## 2017-12-16 MED ORDER — SODIUM CHLORIDE 0.9 % IV SOLN
Freq: Once | INTRAVENOUS | Status: AC
  Administered 2017-12-16: 11:00:00 via INTRAVENOUS

## 2017-12-16 MED ORDER — ONDANSETRON 8 MG PO TBDP
8.0000 mg | ORAL_TABLET | Freq: Once | ORAL | Status: AC
  Administered 2017-12-16: 8 mg via ORAL
  Filled 2017-12-16: qty 1

## 2017-12-16 MED ORDER — SODIUM CHLORIDE 0.9 % IV SOLN
50.0000 mg/m2 | Freq: Once | INTRAVENOUS | Status: AC
  Administered 2017-12-16: 81.7 mg via INTRAVENOUS
  Filled 2017-12-16: qty 19

## 2017-12-16 MED ORDER — ONDANSETRON HCL 40 MG/20ML IJ SOLN: Freq: Once | INTRAMUSCULAR | Status: DC

## 2017-12-16 MED ORDER — DEXAMETHASONE SODIUM PHOSPHATE 10 MG/ML IJ SOLN
10.0000 mg | Freq: Once | INTRAMUSCULAR | Status: AC
  Administered 2017-12-16: 10 mg via INTRAVENOUS
  Filled 2017-12-16: qty 1

## 2017-12-16 NOTE — Patient Instructions (Addendum)
[facility] at [facility] Discharge Instructions  You saw Dr. Delton Coombes today. We will see you in 2 weeks with labs.    Thank you for choosing [place] at [facility] to provide your oncology and hematology care.  To afford each patient quality time with our provider, please arrive at least 15 minutes before your scheduled appointment time.   If you have a lab appointment with the [facility] please come in thru the  Main Entrance and check in at the main information desk  You need to re-schedule your appointment should you arrive 10 or more minutes late.  We strive to give you quality time with our providers, and arriving late affects you and other patients whose appointments are after yours.  Also, if you no show three or more times for appointments you may be dismissed from the clinic at the providers discretion.     Again, thank you for choosing [facility].  Our hope is that these requests will decrease the amount of time that you wait before being seen by our physicians.       _____________________________________________________________  Should you have questions after your visit to [facility], please contact our office at [phone] between the hours of 8:00 a.m. and 4:30 p.m.  Voicemails left after 4:00 p.m. will not be returned until the following business day.  For prescription refill requests, have your pharmacy contact our office and allow 72 hours.    Cancer Center Support Programs:   > Cancer Support Group  2nd Tuesday of the month 1pm-2pm, Journey Room

## 2017-12-16 NOTE — Patient Instructions (Signed)
[facility] Discharge Instructions for Patients Receiving Chemotherapy   Beginning January 23rd 2017 lab work for the Cancer Center will be done in the  Main lab at [place] on 1st floor. If you have a lab appointment with the Cancer Center please come in thru the  Main Entrance and check in at the main information desk   Today you received the following chemotherapy agents Irinotecan Liposomal, Leucovorin and 5FU. Follow-up as scheduled. Call clinic for any questions or concerns  To help prevent nausea and vomiting after your treatment, we encourage you to take your nausea medication   If you develop nausea and vomiting, or diarrhea that is not controlled by your medication, call the clinic.  The clinic phone number is [phone]. Office hours are Monday-Friday 8:30am-5:00pm.  BELOW ARE SYMPTOMS THAT SHOULD BE REPORTED IMMEDIATELY:  *FEVER GREATER THAN 101.0 F  *CHILLS WITH OR WITHOUT FEVER  NAUSEA AND VOMITING THAT IS NOT CONTROLLED WITH YOUR NAUSEA MEDICATION  *UNUSUAL SHORTNESS OF BREATH  *UNUSUAL BRUISING OR BLEEDING  TENDERNESS IN MOUTH AND THROAT WITH OR WITHOUT PRESENCE OF ULCERS  *URINARY PROBLEMS  *BOWEL PROBLEMS  UNUSUAL RASH Items with * indicate a potential emergency and should be followed up as soon as possible. If you have an emergency after office hours please contact your primary care physician or go to the nearest emergency department.  Please call the clinic during office hours if you have any questions or concerns.   You may also contact the Patient Navigator at [phone] should you have any questions or need assistance in obtaining follow up care.      Resources For Cancer Patients and their Caregivers ? American Cancer Society: Can assist with transportation, wigs, general needs, runs Look Good Feel Better.        1-888-227-6333 ? Cancer Care: Provides financial assistance, online support groups, medication/co-pay  assistance.  1-800-813-HOPE (4673) ? [facility] Assists [place] cancer patients and their families through emotional , educational and financial support.  336-427-4357 ? [place] DSS Where to apply for food stamps, Medicaid and utility assistance. 336-342-1394 ? [facility]: Transportation to medical appointments. 336-347-2287 ? Social Security Administration: May apply for disability if have a Stage IV cancer. 336-342-7796 1-800-772-1213 ? [place] Aging, Disability and Transit Services: Assists with nutrition, care and transit needs. 336-349-2343         

## 2017-12-16 NOTE — Progress Notes (Signed)
[place] reviewed with and pt seen by Dr. Delton Coombes today and pt approved for chemo tx per MD                                                                                  Alecia Lemming tolerated chemo tx well without complaints or incident. Pt discharged with 5FU pump infusing without issues. VSS upon discharge. Pt discharged self ambulatory in satisfactory condition

## 2017-12-16 NOTE — Progress Notes (Signed)
[name], [place] [id number]   CLINIC:  Medical Oncology/Hematology  PCP:  Fayrene Helper, MD [id number], [address] [place] [address] [phone]   REASON FOR VISIT:  Follow-up for pancreatic cancer with metastasis to the liver  CURRENT THERAPY: Infusional 5FU and liposomal Irinotecan  BRIEF ONCOLOGIC HISTORY:    Pancreatic cancer metastasized to liver ([place])   03/10/2014 Initial Diagnosis    Pancreatic cancer metastasized to liver      03/22/2014 - 03/05/2016 Chemotherapy    Abraxane/Gemzar days 1, 8, every 28 days.  Day 15 was cancelled due to leukopenia and thrombocytopenia on day 15 cycle 1.      05/24/2014 Treatment Plan Change    Day 8 of cycle 3 is held with ANC of 1.1      06/05/2014 Imaging    CT C/A/P . Interval decrease in size of the pancreatic tail mass.Improved hepatic metastatic disease. No new lesions. No CT findings for metastatic disease involving the chest.      08/09/2014 Tumor Marker    CA 19-9= 33 (WNL)      10/26/2014 Imaging    MRI- Continued interval decrease in size of the hepatic metastatic lesions and no new lesions are identified. Continued decrease in size of the pancreatic tail lesion.      01/03/2015 Tumor Marker    Results for [name] (MRN [id number]) as of 01/18/2015 12:52  01/03/2015 10:00 CA 19-9: 14       01/17/2015 Imaging    MRI- Response to therapy of hepatic metastasis.  Similar size of a pancreatic tail lesion.      03/20/2015 Imaging    MRI-L spine- Severe disc space narrowing at L2-L3, with endplate reactive changes. Large disc extrusion into the ventral epidural space,central to the RIGHT with a cephalad migrated free fragment. Additional Large disc extrusion into the retroperitone...      03/22/2015 Imaging    CT pelvis- No evidence for metastatic disease within the pelvis.      07/24/2015 Imaging    MRI abd- Continued response to therapy, with no residual  detectable liver metastases. No new sites of metastatic disease in the abdomen.       08/15/2015 Code Status     She confirms desire for DNR status.      12/21/2015 Imaging    MRI liver- Severe image degradation due to motion artifact reducing diagnostic sensitivity and specificity. Reduce conspicuity of the pancreatic tail lesion suggesting further improvement. The original liver lesions have resolved.      03/05/2016 Treatment Plan Change    Chemotherapy holiday/Break after 2 years worth of treatment      04/07/2016 Imaging    CT chest- When compared to recent chest CT, new minimally displaced anterior left sixth rib fracture. Slight increase in subcarinal adenopathy      04/28/2016 Imaging    Bone density- BMD as determined from Femur Neck Right is 0.705 g/cm2 with a T-Score of -2.4. This patient is considered osteopenic according to [facility]) criteria. Compared with the prior study on 02/23/2013, the BMD of the lumbar spine/rt. femoral neck show a statistically significant decrease.       05/23/2016 Imaging    MRI abd- 1. Exam is significantly degraded by patient respiratory motion. Consider follow-up exams with CT abdomen with without contrast per pancreatic protocol. 2. Fullness in tail of pancreas is more prominent and potentially increased in size. Recommend close attention  on follow-up. (Consider CT as above) 3. No explanation for back pain.      09/11/2016 Imaging    MRI pancreas- Interval progression of pancreatic tail lesion. New differential perfusion right liver with areas of heterogeneity. Underlying metastatic disease in this region not excluded.      09/11/2016 Progression    MRi in conjunction with rising CA 19-9 are indicative of relapse of disease.      01/09/2017 Progression    CT C/A/P: 2.1 x 2.9 cm lesion in the pancreatic tail and abutting the splenic hilum, corresponding to known primary pancreatic neoplasm,  mildly increased.  Scattered small hypoenhancing lesions in the liver measuring up to 10 mm, approximately 8-10 in number, suspicious for hepatic metastases.  No findings specific for metastatic disease in the chest.  Additional ancillary findings as above.       01/27/2017 - 10/20/2017 Chemotherapy    The patient had gemcitabine (GEMZAR) 1,292 mg in sodium chloride 0.9 % 250 mL chemo infusion, 800 mg/m2 = 1,292 mg, Intravenous,  Once, 9 of 12 cycles Administration: 1,292 mg (01/28/2017), 1,292 mg (02/11/2017), 1,292 mg (02/25/2017), 1,292 mg (03/11/2017), 1,292 mg (03/25/2017), 1,292 mg (04/08/2017), 1,292 mg (04/22/2017), 1,292 mg (04/29/2017), 1,292 mg (05/20/2017), 1,292 mg (06/03/2017), 1,292 mg (06/24/2017), 1,292 mg (07/08/2017), 1,292 mg (07/29/2017), 1,292 mg (08/12/2017), 1,292 mg (08/26/2017), 1,292 mg (09/09/2017), 1,292 mg (09/23/2017), 1,292 mg (10/07/2017)  for chemotherapy treatment.       10/20/2017 -  Chemotherapy    The patient had leucovorin 700 mg in dextrose 5 % 250 mL infusion, 644 mg, Intravenous,  Once, 5 of 6 cycles Administration: 700 mg (10/21/2017), 700 mg (11/04/2017), 700 mg (11/18/2017), 700 mg (12/02/2017), 700 mg (12/16/2017) ondansetron (ZOFRAN) 8 mg in sodium chloride 0.9 % 50 mL IVPB, , Intravenous,  Once, 4 of 5 cycles Administration:  (11/04/2017) fluorouracil (ADRUCIL) 3,850 mg in sodium chloride 0.9 % 73 mL chemo infusion, 2,400 mg/m2 = 3,850 mg, Intravenous, 1 Day/Dose, 5 of 6 cycles Dose modification: 1,920 mg/m2 (original dose 2,400 mg/m2, Cycle 2, Reason: Provider Judgment, Comment: mucisitis) Administration: 3,850 mg (10/21/2017), 3,100 mg (11/04/2017), 3,100 mg (11/18/2017), 3,100 mg (12/02/2017), 3,100 mg (12/16/2017) irinotecan LIPOSOME (ONIVYDE) 81.7 mg in sodium chloride 0.9 % 500 mL chemo infusion, 50 mg/m2 = 81.7 mg (100 % of original dose 50 mg/m2), Intravenous, Once, 5 of 6 cycles Dose modification: 50 mg/m2 (original dose 50 mg/m2, Cycle 1, Reason: Provider  Judgment), 50 mg/m2 (original dose 50 mg/m2, Cycle 2, Reason: Provider Judgment) Administration: 81.7 mg (10/21/2017), 81.7 mg (11/04/2017), 81.7 mg (11/18/2017), 81.7 mg (12/02/2017), 81.7 mg (12/16/2017)  for chemotherapy treatment.         CANCER STAGING: Cancer Staging Pancreatic cancer metastasized to liver [facility]) Staging form: Pancreas, AJCC 7th Edition - Clinical: Stage IV (T3, N1, M1) - Signed by Baird Cancer, PA-C on 03/16/2014 - Pathologic: No stage assigned - Unsigned    INTERVAL HISTORY:  Ms. Cannell 84 y.o. female returns for routine follow-up for metastatic pancreatic cancer to the liver and consideration for next cycle of chemotherapy. Patient is here today doing well since her last treatment. She has had diarrhea since her last treatment she is taking imodium which is helping. She states she did not get her femur x-ray we ordered last week due to being tired after her treatment. She states the pain is gone now and wants to wait. Her bilateral lower extremities swell every night. Patient lives at home and her son is in close contact with  her daily. She performs all her own ADLs with the help of her son occassionally. Patient denies any new pains. Denies any nausea or vomiting. Denies any fever or recent infections.      REVIEW OF SYSTEMS:  Review of Systems  Constitutional: Positive for fatigue.  HENT:  Negative.   Eyes: Negative.   Respiratory: Negative.   Cardiovascular: Positive for leg swelling (bilateral feet).  Gastrointestinal: Positive for diarrhea.  Endocrine: Negative.   Genitourinary: Negative.    Skin: Negative.   Neurological: Positive for dizziness, extremity weakness and numbness (feet).  Hematological: Negative.   Psychiatric/Behavioral: Negative.      PAST MEDICAL/SURGICAL HISTORY:  Past Medical History:  Diagnosis Date  . Anemia due to antineoplastic chemotherapy 09/12/2015   Started Aranesp 500 mcg on 09/12/2015  . Anxiety   . Chronic diarrhea    . Depression   . DNR (do not resuscitate) 08/16/2015  . Erosive esophagitis   . GERD (gastroesophageal reflux disease)   . Hx of adenomatous colonic polyps    tubular adenomas, last found in 2008  . Hyperplastic colon polyp 03/19/10   tcs by Dr. Gala Romney  . Hypertension 20 years   . Kidney stone    hx/ crushed   . Opioid contract exists 04/18/2015   With Dr. Moshe Cipro  . Pancreatic cancer ([place]) 02/2014  . Pancreatic cancer metastasized to liver ([place]) 03/10/2014  . Schatzki's ring 08/26/10   Last dilated on EGD by Dr. Trevor Iha [facility], linear gastric erosions, BI hemigastrectomy   Past Surgical History:  Procedure Laterality Date  . BREAST LUMPECTOMY Left   . bunion removal     from both feet   . CHOLECYSTECTOMY  1965   . COLONOSCOPY  03/19/2010   DR Gala Romney,, normal TI, pancolonic diverticula, random colon bx neg., hyperplastic polyps removed  . ESOPHAGOGASTRODUODENOSCOPY  11/22/2003   DR Gala Romney, erosive RE, Billroth I  . ESOPHAGOGASTRODUODENOSCOPY  08/26/10   Dr. Gala Romney- moderate severe ERE, Scahtzki ring s/p dilation, Billroth I, linear gastric erosions, bx-gastric xanthelasma  . LEFT SHOULDER SURGERY  2009   DR HARRISON  . ORIF ANKLE FRACTURE Right 05/22/2013   Procedure: OPEN REDUCTION INTERNAL FIXATION (ORIF) RIGHT ANKLE FRACTURE;  Surgeon: Sanjuana Kava, MD;  Location: [facility];  Service: Orthopedics;  Laterality: Right;  . stomach ulcer  50 years ago    had some of her stomach removed      SOCIAL HISTORY:  Social History   Socioeconomic History  . Marital status: Widowed    Spouse name: Not on file  . Number of children: 2  . Years of education: Not on file  . Highest education level: Not on file  Occupational History  . Occupation: retired from Estate manager/land agent: RETIRED  Social Needs  . Financial resource strain: Not on file  . Food insecurity:    Worry: Not on file    Inability: Not on file  . Transportation needs:    Medical: Not on file    Non-medical: Not on file   Tobacco Use  . Smoking status: Current Some Day Smoker    Packs/day: 0.50    Years: 60.00    Pack years: 30.00    Types: Cigarettes    Last attempt to quit: 09/09/2016    Years since quitting: 1.2  . Smokeless tobacco: Never Used  Substance and Sexual Activity  . Alcohol use: No  . Drug use: No  . Sexual activity: Never  Lifestyle  . Physical activity:  Days per week: Not on file    Minutes per session: Not on file  . Stress: Not on file  Relationships  . Social connections:    Talks on phone: Not on file    Gets together: Not on file    Attends religious service: Not on file    Active member of club or organization: Not on file    Attends meetings of clubs or organizations: Not on file    Relationship status: Not on file  . Intimate partner violence:    Fear of current or ex partner: Not on file    Emotionally abused: Not on file    Physically abused: Not on file    Forced sexual activity: Not on file  Other Topics Concern  . Not on file  Social History Narrative  . Not on file    FAMILY HISTORY:  Family History  Problem Relation Age of Onset  . Hypertension Mother   . Kidney failure Brother        on dialysis  . Diabetes Sister   . Diabetes Sister   . Hypertension Father   . Kidney failure Sister   . Kidney Stones Brother   . Breast cancer Son   . Gout Son   . Liver disease Neg Hx   . Colon cancer Neg Hx     CURRENT MEDICATIONS:  Outpatient Encounter Medications as of 12/16/2017  Medication Sig Note  . acetaminophen (TYLENOL) 500 MG tablet Take 500 mg by mouth every 6 (six) hours as needed for mild pain or moderate pain.    Marland Kitchen amLODipine (NORVASC) 10 MG tablet TAKE 1 TABLET BY MOUTH EVERY DAY   . clonazePAM (KLONOPIN) 0.5 MG tablet TAKE 1 TABLET BY MOUTH DAILY AS NEEDED FOR ANXIETY   . cycloSPORINE (RESTASIS) 0.05 % ophthalmic emulsion Place 1 drop into both eyes 2 (two) times daily.   . diphenhydrAMINE (BENADRYL) 50 MG tablet Take 1 tablet 1 hour prior  to CT   . diphenoxylate-atropine (LOMOTIL) 2.5-0.025 MG tablet Take by mouth 4 (four) times daily as needed for diarrhea or loose stools.   Marland Kitchen dronabinol (MARINOL) 2.5 MG capsule Take 1 capsule (2.5 mg total) by mouth 2 (two) times daily before a meal.   . lidocaine-prilocaine (EMLA) cream APPLY A QUARTER SIZE AMOUNT TO PORT SITE 1 HOURS PRIOR TO CHEMO DO NOT RUB IN COVER WITH PLASTIC WRAP   . metoprolol tartrate (LOPRESSOR) 25 MG tablet Take 1 tablet by mouth 2 (two) times daily. 12/02/2017: Taking once a day   . mirabegron ER (MYRBETRIQ) 25 MG TB24 tablet Take 1 tablet (25 mg total) by mouth daily.   . mirtazapine (REMERON) 7.5 MG tablet Take 1 tablet (7.5 mg total) by mouth at bedtime.   Marland Kitchen olmesartan (BENICAR) 20 MG tablet Take 1 tablet (20 mg total) by mouth daily.   Marland Kitchen oxybutynin (DITROPAN-XL) 5 MG 24 hr tablet TK 1 T PO QD   . pantoprazole (PROTONIX) 20 MG tablet TAKE 1 TABLET(20 MG) BY MOUTH DAILY   . potassium chloride (K-DUR) 10 MEQ tablet Take 1 tablet (10 mEq total) by mouth 2 (two) times daily.   . predniSONE (DELTASONE) 50 MG tablet Take 1 tablet 13 hours, 7 hours and 1 hour prior to CT   . traMADol (ULTRAM) 50 MG tablet One tablet once daily, as needed, for uncontrolled pain   . [DISCONTINUED] prochlorperazine (COMPAZINE) 10 MG tablet Take 1 tablet (10 mg total) by mouth every 6 (six) hours as  needed (Nausea or vomiting).    Facility-Administered Encounter Medications as of 12/16/2017  Medication  . dexamethasone (DECADRON) 10 mg in sodium chloride 0.9 % 50 mL IVPB    ALLERGIES:  Allergies  Allergen Reactions  . Iohexol Hives and Shortness Of Breath    PATIENT HAD TO BE TAKEN TO ED, C/O WHELPS, HIVES, DIFFICULTY BREATHING. PATIENT GIVEN IV CONTRAST AFTER 13 HOUR PRE MEDS ON 01/09/2017 WITH NO REACTION NOTED. On 10/19/17 patient had reaction despite premedication.    Marland Kitchen Aciphex [Rabeprazole Sodium] Other (See Comments)    unknown  . Amlodipine Besylate-Valsartan     Rash to  high-dose (5/320)  . Esomeprazole Magnesium   . Omeprazole Other (See Comments)    Patient states "medication didn't work"  . Ciprofloxacin Rash  . Penicillins Swelling and Rash    Has patient had a PCN reaction causing immediate rash, facial/tongue/throat swelling, SOB or lightheadedness with hypotension: Yes Has patient had a PCN reaction causing severe rash involving mucus membranes or skin necrosis: Yes Has patient had a PCN reaction that required hospitalization: No Has patient had a PCN reaction occurring within the last 10 years: yes If all of the above answers are "NO", then may proceed with Cephalosporin use.      PHYSICAL EXAM:  ECOG Performance status: 1  Vitals:   12/16/17 1003  BP: (!) 128/55  Pulse: (!) 57  Resp: 16  Temp: 97.9 F (36.6 C)  SpO2: 98%   Filed Weights   12/16/17 1003  Weight: 123 lb 3.2 oz (55.9 kg)    Physical Exam  Constitutional: She is oriented to person, place, and time.  Cardiovascular: Normal rate, regular rhythm and normal heart sounds.  Pulmonary/Chest: Effort normal and breath sounds normal.  Neurological: She is alert and oriented to person, place, and time.  Skin: Skin is warm and dry.     LABORATORY DATA:  I have reviewed the labs as listed.  CBC    Component Value Date/Time   WBC 4.8 12/16/2017 0929   RBC 3.42 (L) 12/16/2017 0929   HGB 10.2 (L) 12/16/2017 0929   HCT 30.8 (L) 12/16/2017 0929   PLT 131 (L) 12/16/2017 0929   MCV 90.1 12/16/2017 0929   MCH 29.8 12/16/2017 0929   MCHC 33.1 12/16/2017 0929   RDW 17.5 (H) 12/16/2017 0929   LYMPHSABS 1.5 12/16/2017 0929   MONOABS 0.4 12/16/2017 0929   EOSABS 0.2 12/16/2017 0929   BASOSABS 0.0 12/16/2017 0929   CMP Latest Ref Rng & Units 12/16/2017 12/02/2017 11/18/2017  Glucose 70 - 99 mg/dL 106(H) 112(H) 92  BUN 8 - 23 mg/dL 21 36(H) 21  Creatinine 0.44 - 1.00 mg/dL 1.33(H) 1.40(H) 1.47(H)  Sodium 135 - 145 mmol/L 139 142 139  Potassium 3.5 - 5.1 mmol/L 4.2 4.0 4.2   Chloride 98 - 111 mmol/L 106 109 104  CO2 22 - 32 mmol/L _0 Calcium 8.9 - 10.3 mg/dL 8.7(L) 8.7(L) 8.4(L)  Total Protein 6.5 - 8.1 g/dL 7.2 7.0 6.8  Total Bilirubin 0.3 - 1.2 mg/dL 0.6 0.6 0.6  Alkaline Phos 38 - 126 U/L 142(H) 178(H) 159(H)  AST 15 - 41 U/L _1 ALT 0 - 44 U/L _2 ASSESSMENT & PLAN:   Pancreatic cancer metastasized to liver ([place]) 1.  Metastatic pancreatic cancer to the liver: -Foundation 1 testing shows MS and TMB cannot be determined, KRAS G12V, TP 53 mutation - Gemcitabine and  Abraxane from November 2015 through August 2018 with progression -Gemcitabine 800 mg/m (day 1 and day 15) and Tarceva 100 mg daily q. 28 days started on 01/28/2017 -CT scan done on 07/13/2017 shows stable liver disease. CA 19-9 was on 06/24/2017 at 129.  CA-19-9 at last visit went up to 171. - I discussed the latest CT scan reports which showed progression in the liver lesions which are very subtle.  There is progression in the tail of the pancreas lesion measuring 27 x 27 mm.  Even though the progression is very small, her CA 19-9 is continuing to go up, last one at 171.  Hence I have recommended a change in her treatment.  Gemcitabine and Tarceva were discontinued. -We will consider testing BRCA1/2 and PALB2 mutation.  We will also consider testing MSI. - First cycle of infusional 5-FU and liposomal Irinotecan started on 10/21/2017, Onivyde started at a decreased dose of 50 mg/m. - We have cut back on infusional 5-FU dose by 20% after cycle 1 when she developed mucositis.  Since then she has been tolerating it very well.  Cycle 3 was on 11/18/2017.  Her CA-19-9 has come down to 263 (11/18/2017) from 316 (11/04/2017). - She tolerated her cycle 4 reasonably well.  Today we have reviewed her blood counts.  She will proceed with cycle 5 without any changes in her doses.  I plan to repeat CT scans after cycle 6. - At last visit she complained of pain in the right lateral thigh.   We have ordered x-rays.  Patient felt improvement in her pain and did not do the x-rays.  She will let us know if the pain gets any worse.  2.  Weight loss: She is taking Marinol 2.5 mg twice daily.  Her weight has been stable around 123 for the past few visits.      Orders placed this encounter:  Orders Placed This Encounter  Procedures  . Lactate dehydrogenase  . CBC with Differential/Platelet  . Comprehensive metabolic panel  . Cancer antigen 19-9      Derek Jack, MD [place] [phone]

## 2017-12-16 NOTE — Assessment & Plan Note (Signed)
1.  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 28 days started on 01/28/2017 -CT scan done on 07/13/2017 shows stable liver disease. CA 19-9 was on 06/24/2017 at 129.  CA-19-9 at last visit went up to 171. - I discussed the latest CT scan reports which showed progression in the liver lesions which are very subtle.  There is progression in the tail of the pancreas lesion measuring 27 x 27 mm.  Even though the progression is very small, her CA 19-9 is continuing to go up, last one at 171.  Hence I have recommended a change in her treatment.  Gemcitabine and Tarceva were discontinued. -We will consider testing BRCA1/2 and PALB2 mutation.  We will also consider testing MSI. - First cycle of infusional 5-FU and liposomal Irinotecan started on 10/21/2017, Onivyde started at a decreased dose of 50 mg/m. - We have cut back on infusional 5-FU dose by 20% after cycle 1 when she developed mucositis.  Since then she has been tolerating it very well.  Cycle 3 was on 11/18/2017.  Her CA-19-9 has come down to 263 (11/18/2017) from 316 (11/04/2017). - She tolerated her cycle 4 reasonably well.  Today we have reviewed her blood counts.  She will proceed with cycle 5 without any changes in her doses.  I plan to repeat CT scans after cycle 6. - At last visit she complained of pain in the right lateral thigh.  We have ordered x-rays.  Patient felt improvement in her pain and did not do the x-rays.  She will let us know if the pain gets any worse.  2.  Weight loss: She is taking Marinol 2.5 mg twice daily.  Her weight has been stable around 123 for the past few visits.

## 2017-12-17 LAB — CANCER ANTIGEN 19-9: CA 19-9: 236 U/mL — ABNORMAL HIGH (ref 0–35)

## 2017-12-18 ENCOUNTER — Inpatient Hospital Stay (HOSPITAL_COMMUNITY): Payer: Medicare HMO

## 2017-12-18 ENCOUNTER — Encounter (HOSPITAL_COMMUNITY): Payer: Self-pay

## 2017-12-18 VITALS — BP 125/58 | HR 52 | Temp 98.1°F | Resp 18

## 2017-12-18 DIAGNOSIS — R944 Abnormal results of kidney function studies: Secondary | ICD-10-CM | POA: Diagnosis not present

## 2017-12-18 DIAGNOSIS — R69 Illness, unspecified: Secondary | ICD-10-CM | POA: Diagnosis not present

## 2017-12-18 DIAGNOSIS — M79651 Pain in right thigh: Secondary | ICD-10-CM | POA: Diagnosis not present

## 2017-12-18 DIAGNOSIS — C252 Malignant neoplasm of tail of pancreas: Secondary | ICD-10-CM | POA: Diagnosis not present

## 2017-12-18 DIAGNOSIS — M85851 Other specified disorders of bone density and structure, right thigh: Secondary | ICD-10-CM | POA: Diagnosis not present

## 2017-12-18 DIAGNOSIS — Z66 Do not resuscitate: Secondary | ICD-10-CM | POA: Diagnosis not present

## 2017-12-18 DIAGNOSIS — C787 Secondary malignant neoplasm of liver and intrahepatic bile duct: Principal | ICD-10-CM

## 2017-12-18 DIAGNOSIS — Z9221 Personal history of antineoplastic chemotherapy: Secondary | ICD-10-CM | POA: Diagnosis not present

## 2017-12-18 DIAGNOSIS — Z5111 Encounter for antineoplastic chemotherapy: Secondary | ICD-10-CM | POA: Diagnosis not present

## 2017-12-18 DIAGNOSIS — C259 Malignant neoplasm of pancreas, unspecified: Secondary | ICD-10-CM

## 2017-12-18 MED ORDER — HEPARIN SOD (PORK) LOCK FLUSH 100 UNIT/ML IV SOLN
500.0000 [IU] | Freq: Once | INTRAVENOUS | Status: AC | PRN
  Administered 2017-12-18: 500 [IU]

## 2017-12-18 MED ORDER — SODIUM CHLORIDE 0.9% FLUSH
10.0000 mL | INTRAVENOUS | Status: DC | PRN
  Administered 2017-12-18: 10 mL
  Filled 2017-12-18: qty 10

## 2017-12-18 NOTE — Patient Instructions (Signed)
[facility] at [facility] Discharge Instructions  5FU pump discontinued with portacath flushed per protocol today. Follow-up as scheduled. Call clinic for any questions or concerns    Thank you for choosing [facility] at [facility] to provide your oncology and hematology care.  To afford each patient quality time with our provider, please arrive at least 15 minutes before your scheduled appointment time.   If you have a lab appointment with the Cancer Center please come in thru the  Main Entrance and check in at the main information desk  You need to re-schedule your appointment should you arrive 10 or more minutes late.  We strive to give you quality time with our providers, and arriving late affects you and other patients whose appointments are after yours.  Also, if you no show three or more times for appointments you may be dismissed from the clinic at the providers discretion.     Again, thank you for choosing [facility].  Our hope is that these requests will decrease the amount of time that you wait before being seen by our physicians.       _____________________________________________________________  Should you have questions after your visit to [facility], please contact our office at [phone] between the hours of 8:00 a.m. and 4:30 p.m.  Voicemails left after 4:00 p.m. will not be returned until the following business day.  For prescription refill requests, have your pharmacy contact our office and allow 72 hours.    Cancer Center Support Programs:   > Cancer Support Group  2nd Tuesday of the month 1pm-2pm, Journey Room   

## 2017-12-18 NOTE — Progress Notes (Signed)
Lorian Rappa tolerated 5FU pump well without complaints or incident. 5FU pump discontinued with portacath flushed with 10 ml NS and 5 ml Heparin easily per protocol then de-accessed. VSS Pt discharged via wheelchair in stable condition accompanied by family member

## 2017-12-21 DIAGNOSIS — C259 Malignant neoplasm of pancreas, unspecified: Secondary | ICD-10-CM | POA: Diagnosis not present

## 2017-12-21 DIAGNOSIS — C787 Secondary malignant neoplasm of liver and intrahepatic bile duct: Secondary | ICD-10-CM | POA: Diagnosis not present

## 2017-12-22 ENCOUNTER — Other Ambulatory Visit: Payer: Self-pay | Admitting: Family Medicine

## 2017-12-28 ENCOUNTER — Ambulatory Visit (INDEPENDENT_AMBULATORY_CARE_PROVIDER_SITE_OTHER): Payer: Medicare HMO | Admitting: Family Medicine

## 2017-12-28 ENCOUNTER — Other Ambulatory Visit: Payer: Self-pay

## 2017-12-28 ENCOUNTER — Telehealth (HOSPITAL_COMMUNITY): Payer: Self-pay | Admitting: Emergency Medicine

## 2017-12-28 ENCOUNTER — Encounter: Payer: Self-pay | Admitting: Family Medicine

## 2017-12-28 VITALS — BP 150/78 | HR 57 | Resp 12 | Ht 63.0 in | Wt 121.1 lb

## 2017-12-28 DIAGNOSIS — R69 Illness, unspecified: Secondary | ICD-10-CM | POA: Diagnosis not present

## 2017-12-28 DIAGNOSIS — F5105 Insomnia due to other mental disorder: Secondary | ICD-10-CM | POA: Diagnosis not present

## 2017-12-28 DIAGNOSIS — I10 Essential (primary) hypertension: Secondary | ICD-10-CM

## 2017-12-28 DIAGNOSIS — M150 Primary generalized (osteo)arthritis: Secondary | ICD-10-CM | POA: Diagnosis not present

## 2017-12-28 DIAGNOSIS — F320 Major depressive disorder, single episode, mild: Secondary | ICD-10-CM

## 2017-12-28 DIAGNOSIS — F17218 Nicotine dependence, cigarettes, with other nicotine-induced disorders: Secondary | ICD-10-CM

## 2017-12-28 DIAGNOSIS — F409 Phobic anxiety disorder, unspecified: Secondary | ICD-10-CM

## 2017-12-28 MED ORDER — MIRTAZAPINE 7.5 MG PO TABS: 7.5000 mg | ORAL_TABLET | Freq: Every day | ORAL | 3 refills | Status: DC

## 2017-12-28 MED ORDER — NICOTINE 7 MG/24HR TD PT24: 7.0000 mg | MEDICATED_PATCH | Freq: Every day | TRANSDERMAL | 3 refills | Status: DC

## 2017-12-28 NOTE — Patient Instructions (Addendum)
F/U in November as before, call if you need me sooner  No change in blood pressure medication  New for appetite and  To help you to feel better is Remeron take one every night at bedtime  Thank you  for choosing [facility]. We consider it a privelige to serve you.  Delivering excellent health care in a caring and  compassionate way is our goal.  Partnering with you,  so that together we can achieve this goal is our strategy.  Call [facility]  For 24/7 help when you feel like smoking  NEW is the nicoderm patch change one every day, dO NOT sMOKE wth the patch      Coping with Quitting Smoking Quitting smoking is a physical and mental challenge. You will face cravings, withdrawal symptoms, and temptation. Before quitting, work with your health care provider to make a plan that can help you cope. Preparation can help you quit and keep you from giving in. How can I cope with cravings? Cravings usually last for 5-10 minutes. If you get through it, the craving will pass. Consider taking the following actions to help you cope with cravings:  Keep your mouth busy: ? Chew sugar-free gum. ? Suck on hard candies or a straw. ? Brush your teeth.  Keep your hands and body busy: ? Immediately change to a different activity when you feel a craving. ? Squeeze or play with a ball. ? Do an activity or a hobby, like making bead jewelry, practicing needlepoint, or working with wood. ? Mix up your normal routine. ? Take a short exercise break. Go for a quick walk or run up and down stairs. ? Spend time in public places where smoking is not allowed.  Focus on doing something kind or helpful for someone else.  Call a friend or family member to talk during a craving.  Join a support group.  Call a quit line, such as 1-800-QUIT-NOW.  Talk with your health care provider about medicines that might help you cope with cravings and make quitting easier for you.  How can I deal with  withdrawal symptoms? Your body may experience negative effects as it tries to get used to not having nicotine in the system. These effects are called withdrawal symptoms. They may include:  Feeling hungrier than normal.  Trouble concentrating.  Irritability.  Trouble sleeping.  Feeling depressed.  Restlessness and agitation.  Craving a cigarette.  To manage withdrawal symptoms:  Avoid places, people, and activities that trigger your cravings.  Remember why you want to quit.  Get plenty of sleep.  Avoid coffee and other caffeinated drinks. These may worsen some of your symptoms.  How can I handle social situations? Social situations can be difficult when you are quitting smoking, especially in the first few weeks. To manage this, you can:  Avoid parties, bars, and other social situations where people might be smoking.  Avoid alcohol.  Leave right away if you have the urge to smoke.  Explain to your family and friends that you are quitting smoking. Ask for understanding and support.  Plan activities with friends or family where smoking is not an option.  What are some ways I can cope with stress? Wanting to smoke may cause stress, and stress can make you want to smoke. Find ways to manage your stress. Relaxation techniques can help. For example:  Breathe slowly and deeply, in through your nose and out through your mouth.  Listen to soothing, relaxing music.  Talk with a family member or friend about your stress.  Light a candle.  Soak in a bath or take a shower.  Think about a peaceful place.  What are some ways I can prevent weight gain? Be aware that many people gain weight after they quit smoking. However, not everyone does. To keep from gaining weight, have a plan in place before you quit and stick to the plan after you quit. Your plan should include:  Having healthy snacks. When you have a craving, it may help to: ? Eat plain popcorn, crunchy carrots,  celery, or other cut vegetables. ? Chew sugar-free gum.  Changing how you eat: ? Eat small portion sizes at meals. ? Eat 4-6 small meals throughout the day instead of 1-2 large meals a day. ? Be mindful when you eat. Do not watch television or do other things that might distract you as you eat.  Exercising regularly: ? Make time to exercise each day. If you do not have time for a long workout, do short bouts of exercise for 5-10 minutes several times a day. ? Do some form of strengthening exercise, like weight lifting, and some form of aerobic exercise, like running or swimming.  Drinking plenty of water or other low-calorie or no-calorie drinks. Drink 6-8 glasses of water daily, or as much as instructed by your health care provider.  Summary  Quitting smoking is a physical and mental challenge. You will face cravings, withdrawal symptoms, and temptation to smoke again. Preparation can help you as you go through these challenges.  You can cope with cravings by keeping your mouth busy (such as by chewing gum), keeping your body and hands busy, and making calls to family, friends, or a helpline for people who want to quit smoking.  You can cope with withdrawal symptoms by avoiding places where people smoke, avoiding drinks with caffeine, and getting plenty of rest.  Ask your health care provider about the different ways to prevent weight gain, avoid stress, and handle social situations. This information is not intended to replace advice given to you by your health care provider. Make sure you discuss any questions you have with your health care provider. Document Released: 04/25/2016 Document Revised: 04/25/2016 Document Reviewed: 04/25/2016 Elsevier Interactive Patient Education  2018 [employer].  Steps to Quit Smoking Smoking tobacco can be bad for your health. It can also affect almost every organ in your body. Smoking puts you and people around you at risk for many serious  long-lasting (chronic) diseases. Quitting smoking is hard, but it is one of the best things that you can do for your health. It is never too late to quit. What are the benefits of quitting smoking? When you quit smoking, you lower your risk for getting serious diseases and conditions. They can include:  Lung cancer or lung disease.  Heart disease.  Stroke.  Heart attack.  Not being able to have children (infertility).  Weak bones (osteoporosis) and broken bones (fractures).  If you have coughing, wheezing, and shortness of breath, those symptoms may get better when you quit. You may also get sick less often. If you are pregnant, quitting smoking can help to lower your chances of having a baby of low birth weight. What can I do to help me quit smoking? Talk with your doctor about what can help you quit smoking. Some things you can do (strategies) include:  Quitting smoking totally, instead of slowly cutting back how much you smoke over a period  of time.  Going to in-person counseling. You are more likely to quit if you go to many counseling sessions.  Using resources and support systems, such as: ? Database administrator with a Social worker. ? Phone quitlines. ? Careers information officer. ? Support groups or group counseling. ? Text messaging programs. ? Mobile phone apps or applications.  Taking medicines. Some of these medicines may have nicotine in them. If you are pregnant or breastfeeding, do not take any medicines to quit smoking unless your doctor says it is okay. Talk with your doctor about counseling or other things that can help you.  Talk with your doctor about using more than one strategy at the same time, such as taking medicines while you are also going to in-person counseling. This can help make quitting easier. What things can I do to make it easier to quit? Quitting smoking might feel very hard at first, but there is a lot that you can do to make it easier. Take these  steps:  Talk to your family and friends. Ask them to support and encourage you.  Call phone quitlines, reach out to support groups, or work with a Social worker.  Ask people who smoke to not smoke around you.  Avoid places that make you want (trigger) to smoke, such as: ? Bars. ? Parties. ? Smoke-break areas at work.  Spend time with people who do not smoke.  Lower the stress in your life. Stress can make you want to smoke. Try these things to help your stress: ? Getting regular exercise. ? Deep-breathing exercises. ? Yoga. ? Meditating. ? Doing a body scan. To do this, close your eyes, focus on one area of your body at a time from head to toe, and notice which parts of your body are tense. Try to relax the muscles in those areas.  Download or buy apps on your mobile phone or tablet that can help you stick to your quit plan. There are many free apps, such as QuitGuide from the [employer] Office manager for Disease Control and Prevention). You can find more support from smokefree.gov and other websites.  This information is not intended to replace advice given to you by your health care provider. Make sure you discuss any questions you have with your health care provider. Document Released: 02/22/2009 Document Revised: 12/25/2015 Document Reviewed: 09/12/2014 Elsevier Interactive Patient Education  2018 [employer].

## 2017-12-28 NOTE — Telephone Encounter (Signed)
Returned patients call regarding VM left to confirm schedule. No answer and no VM set up on pts phone.

## 2017-12-30 ENCOUNTER — Encounter (HOSPITAL_COMMUNITY): Payer: Self-pay | Admitting: Hematology

## 2017-12-30 ENCOUNTER — Inpatient Hospital Stay (HOSPITAL_COMMUNITY): Payer: Medicare HMO

## 2017-12-30 ENCOUNTER — Ambulatory Visit (HOSPITAL_COMMUNITY)
Admission: RE | Admit: 2017-12-30 | Discharge: 2017-12-30 | Disposition: A | Discharge status: Home / Self Care | Payer: Medicare HMO | Source: Ambulatory Visit | Attending: Nurse Practitioner | Admitting: Nurse Practitioner

## 2017-12-30 ENCOUNTER — Inpatient Hospital Stay (HOSPITAL_BASED_OUTPATIENT_CLINIC_OR_DEPARTMENT_OTHER): Payer: Medicare HMO | Admitting: Hematology

## 2017-12-30 ENCOUNTER — Other Ambulatory Visit: Payer: Self-pay

## 2017-12-30 ENCOUNTER — Other Ambulatory Visit (HOSPITAL_COMMUNITY): Payer: Self-pay | Admitting: Nurse Practitioner

## 2017-12-30 VITALS — BP 166/68 | HR 58 | Temp 97.8°F | Resp 18

## 2017-12-30 VITALS — BP 172/60 | HR 51 | Temp 98.2°F | Resp 20 | Wt 122.0 lb

## 2017-12-30 DIAGNOSIS — C259 Malignant neoplasm of pancreas, unspecified: Secondary | ICD-10-CM | POA: Diagnosis not present

## 2017-12-30 DIAGNOSIS — Z9221 Personal history of antineoplastic chemotherapy: Secondary | ICD-10-CM

## 2017-12-30 DIAGNOSIS — C787 Secondary malignant neoplasm of liver and intrahepatic bile duct: Principal | ICD-10-CM

## 2017-12-30 DIAGNOSIS — F17210 Nicotine dependence, cigarettes, uncomplicated: Secondary | ICD-10-CM | POA: Diagnosis not present

## 2017-12-30 DIAGNOSIS — R69 Illness, unspecified: Secondary | ICD-10-CM | POA: Diagnosis not present

## 2017-12-30 DIAGNOSIS — Z66 Do not resuscitate: Secondary | ICD-10-CM

## 2017-12-30 DIAGNOSIS — R944 Abnormal results of kidney function studies: Secondary | ICD-10-CM | POA: Diagnosis not present

## 2017-12-30 DIAGNOSIS — M1711 Unilateral primary osteoarthritis, right knee: Secondary | ICD-10-CM | POA: Insufficient documentation

## 2017-12-30 DIAGNOSIS — Z5111 Encounter for antineoplastic chemotherapy: Secondary | ICD-10-CM | POA: Diagnosis not present

## 2017-12-30 DIAGNOSIS — M85851 Other specified disorders of bone density and structure, right thigh: Secondary | ICD-10-CM

## 2017-12-30 DIAGNOSIS — C252 Malignant neoplasm of tail of pancreas: Secondary | ICD-10-CM | POA: Diagnosis not present

## 2017-12-30 DIAGNOSIS — M79651 Pain in right thigh: Secondary | ICD-10-CM

## 2017-12-30 DIAGNOSIS — M79604 Pain in right leg: Secondary | ICD-10-CM | POA: Diagnosis present

## 2017-12-30 LAB — CBC WITH DIFFERENTIAL/PLATELET
BASOS ABS: 0 10*3/uL (ref 0.0–0.1)
BASOS PCT: 0 %
Eosinophils Absolute: 0.1 10*3/uL (ref 0.0–0.7)
Eosinophils Relative: 3 %
HEMATOCRIT: 29 % — AB (ref 36.0–46.0)
Hemoglobin: 9.5 g/dL — ABNORMAL LOW (ref 12.0–15.0)
LYMPHS PCT: 24 %
Lymphs Abs: 1.1 10*3/uL (ref 0.7–4.0)
MCH: 29.3 pg (ref 26.0–34.0)
MCHC: 32.8 g/dL (ref 30.0–36.0)
MCV: 89.5 fL (ref 78.0–100.0)
MONO ABS: 0.4 10*3/uL (ref 0.1–1.0)
Monocytes Relative: 8 %
NEUTROS ABS: 3.1 10*3/uL (ref 1.7–7.7)
Neutrophils Relative %: 65 %
Platelets: 118 10*3/uL — ABNORMAL LOW (ref 150–400)
RBC: 3.24 MIL/uL — AB (ref 3.87–5.11)
RDW: 17.2 % — AB (ref 11.5–15.5)
WBC: 4.7 10*3/uL (ref 4.0–10.5)

## 2017-12-30 LAB — COMPREHENSIVE METABOLIC PANEL
ALBUMIN: 3.3 g/dL — AB (ref 3.5–5.0)
ALT: 11 U/L (ref 0–44)
AST: 18 U/L (ref 15–41)
Alkaline Phosphatase: 131 U/L — ABNORMAL HIGH (ref 38–126)
Anion gap: 7 (ref 5–15)
BILIRUBIN TOTAL: 0.6 mg/dL (ref 0.3–1.2)
BUN: 24 mg/dL — AB (ref 8–23)
CO2: 29 mmol/L (ref 22–32)
CREATININE: 1.5 mg/dL — AB (ref 0.44–1.00)
Calcium: 8.5 mg/dL — ABNORMAL LOW (ref 8.9–10.3)
Chloride: 107 mmol/L (ref 98–111)
GFR calc Af Amer: 35 mL/min — ABNORMAL LOW (ref >60)
GFR, EST NON AFRICAN AMERICAN: 30 mL/min — AB (ref >60)
GLUCOSE: 106 mg/dL — AB (ref 70–99)
POTASSIUM: 4.3 mmol/L (ref 3.5–5.1)
Sodium: 143 mmol/L (ref 135–145)
TOTAL PROTEIN: 6.5 g/dL (ref 6.5–8.1)

## 2017-12-30 LAB — LACTATE DEHYDROGENASE: LDH: 133 U/L (ref 98–192)

## 2017-12-30 MED ORDER — SODIUM CHLORIDE 0.9% FLUSH
10.0000 mL | INTRAVENOUS | Status: DC | PRN
  Administered 2017-12-30: 10 mL
  Filled 2017-12-30: qty 10

## 2017-12-30 MED ORDER — DEXAMETHASONE SODIUM PHOSPHATE 10 MG/ML IJ SOLN
10.0000 mg | Freq: Once | INTRAMUSCULAR | Status: AC
  Administered 2017-12-30: 10 mg via INTRAVENOUS
  Filled 2017-12-30: qty 1

## 2017-12-30 MED ORDER — ONDANSETRON 8 MG PO TBDP
8.0000 mg | ORAL_TABLET | Freq: Once | ORAL | Status: AC
  Administered 2017-12-30: 8 mg via ORAL
  Filled 2017-12-30: qty 1

## 2017-12-30 MED ORDER — SODIUM CHLORIDE 0.9 % IV SOLN: Freq: Once | INTRAVENOUS | Status: DC

## 2017-12-30 MED ORDER — SODIUM CHLORIDE 0.9 % IV SOLN
Freq: Once | INTRAVENOUS | Status: AC
  Administered 2017-12-30: 10:00:00 via INTRAVENOUS

## 2017-12-30 MED ORDER — SODIUM CHLORIDE 0.9 % IV SOLN
1920.0000 mg/m2 | INTRAVENOUS | Status: DC
  Administered 2017-12-30: 3100 mg via INTRAVENOUS
  Filled 2017-12-30: qty 62

## 2017-12-30 MED ORDER — LEUCOVORIN CALCIUM INJECTION 350 MG
700.0000 mg | Freq: Once | INTRAVENOUS | Status: AC
  Administered 2017-12-30: 700 mg via INTRAVENOUS
  Filled 2017-12-30: qty 35

## 2017-12-30 MED ORDER — SODIUM CHLORIDE 0.9 % IV SOLN
50.0000 mg/m2 | Freq: Once | INTRAVENOUS | Status: AC
  Administered 2017-12-30: 81.7 mg via INTRAVENOUS
  Filled 2017-12-30: qty 19

## 2017-12-30 NOTE — Progress Notes (Signed)
 [facility] [address] [place], [place] [address]   CLINIC:  Medical Oncology/Hematology  PCP:  Simpson, Margaret E, MD [address], [address] [place] [address] [phone]   REASON FOR VISIT:  Follow-up for metastatic pancreatic cancer to the liver  CURRENT THERAPY: Infusional 5FU and liposomal Irinotecan   BRIEF ONCOLOGIC HISTORY:    Pancreatic cancer metastasized to liver (HCC)   03/10/2014 Initial Diagnosis    Pancreatic cancer metastasized to liver    03/22/2014 - 03/05/2016 Chemotherapy    Abraxane/Gemzar days 1, 8, every 28 days.  Day 15 was cancelled due to leukopenia and thrombocytopenia on day 15 cycle 1.    05/24/2014 Treatment Plan Change    Day 8 of cycle 3 is held with ANC of 1.1    06/05/2014 Imaging    CT C/A/P . Interval decrease in size of the pancreatic tail mass.Improved hepatic metastatic disease. No new lesions. No CT findings for metastatic disease involving the chest.    08/09/2014 Tumor Marker    CA 19-9= 33 (WNL)    10/26/2014 Imaging    MRI- Continued interval decrease in size of the hepatic metastatic lesions and no new lesions are identified. Continued decrease in size of the pancreatic tail lesion.    01/03/2015 Tumor Marker    Results for [name] [name] (MRN [id number]) as of 01/18/2015 12:52  01/03/2015 10:00 CA 19-9: 14     01/17/2015 Imaging    MRI- Response to therapy of hepatic metastasis.  Similar size of a pancreatic tail lesion.    03/20/2015 Imaging    MRI-L spine- Severe disc space narrowing at L2-L3, with endplate reactive changes. Large disc extrusion into the ventral epidural space,central to the RIGHT with a cephalad migrated free fragment. Additional Large disc extrusion into the retroperitone...    03/22/2015 Imaging    CT pelvis- No evidence for metastatic disease within the pelvis.    07/24/2015 Imaging    MRI abd- Continued response to therapy, with no residual detectable liver metastases. No new  sites of metastatic disease in the abdomen.     08/15/2015 Code Status     She confirms desire for DNR status.    12/21/2015 Imaging    MRI liver- Severe image degradation due to motion artifact reducing diagnostic sensitivity and specificity. Reduce conspicuity of the pancreatic tail lesion suggesting further improvement. The original liver lesions have resolved.    03/05/2016 Treatment Plan Change    Chemotherapy holiday/Break after 2 years worth of treatment    04/07/2016 Imaging    CT chest- When compared to recent chest CT, new minimally displaced anterior left sixth rib fracture. Slight increase in subcarinal adenopathy    04/28/2016 Imaging    Bone density- BMD as determined from Femur Neck Right is 0.705 g/cm2 with a T-Score of -2.4. This patient is considered osteopenic according to World Health Organization (WHO) criteria. Compared with the prior study on 02/23/2013, the BMD of the lumbar spine/rt. femoral neck show a statistically significant decrease.     05/23/2016 Imaging    MRI abd- 1. Exam is significantly degraded by patient respiratory motion. Consider follow-up exams with CT abdomen with without contrast per pancreatic protocol. 2. Fullness in tail of pancreas is more prominent and potentially increased in size. Recommend close attention on follow-up. (Consider CT as above) 3. No explanation for back pain.    09/11/2016 Imaging    MRI pancreas- Interval progression of pancreatic tail lesion. New differential perfusion right   liver with areas of heterogeneity. Underlying metastatic disease in this region not excluded.    09/11/2016 Progression    MRi in conjunction with rising CA 19-9 are indicative of relapse of disease.    01/09/2017 Progression    CT C/A/P: 2.1 x 2.9 cm lesion in the pancreatic tail and abutting the splenic hilum, corresponding to known primary pancreatic neoplasm, mildly increased.  Scattered small hypoenhancing lesions in the liver measuring  up to 10 mm, approximately 8-10 in number, suspicious for hepatic metastases.  No findings specific for metastatic disease in the chest.  Additional ancillary findings as above.     01/27/2017 - 10/20/2017 Chemotherapy    The patient had gemcitabine (GEMZAR) 1,292 mg in sodium chloride 0.9 % 250 mL chemo infusion, 800 mg/m2 = 1,292 mg, Intravenous,  Once, 9 of 12 cycles Administration: 1,292 mg (01/28/2017), 1,292 mg (02/11/2017), 1,292 mg (02/25/2017), 1,292 mg (03/11/2017), 1,292 mg (03/25/2017), 1,292 mg (04/08/2017), 1,292 mg (04/22/2017), 1,292 mg (04/29/2017), 1,292 mg (05/20/2017), 1,292 mg (06/03/2017), 1,292 mg (06/24/2017), 1,292 mg (07/08/2017), 1,292 mg (07/29/2017), 1,292 mg (08/12/2017), 1,292 mg (08/26/2017), 1,292 mg (09/09/2017), 1,292 mg (09/23/2017), 1,292 mg (10/07/2017)  for chemotherapy treatment.     10/20/2017 -  Chemotherapy    The patient had leucovorin 700 mg in dextrose 5 % 250 mL infusion, 644 mg, Intravenous,  Once, 6 of 6 cycles Administration: 700 mg (10/21/2017), 700 mg (11/04/2017), 700 mg (11/18/2017), 700 mg (12/02/2017), 700 mg (12/16/2017), 700 mg (12/30/2017) ondansetron (ZOFRAN) 8 mg in sodium chloride 0.9 % 50 mL IVPB, , Intravenous,  Once, 5 of 5 cycles Administration:  (11/04/2017) fluorouracil (ADRUCIL) 3,850 mg in sodium chloride 0.9 % 73 mL chemo infusion, 2,400 mg/m2 = 3,850 mg, Intravenous, 1 Day/Dose, 6 of 6 cycles Dose modification: 1,920 mg/m2 (original dose 2,400 mg/m2, Cycle 2, Reason: Provider Judgment, Comment: mucisitis) Administration: 3,850 mg (10/21/2017), 3,100 mg (11/04/2017), 3,100 mg (11/18/2017), 3,100 mg (12/02/2017), 3,100 mg (12/16/2017), 3,100 mg (12/30/2017) irinotecan LIPOSOME (ONIVYDE) 81.7 mg in sodium chloride 0.9 % 500 mL chemo infusion, 50 mg/m2 = 81.7 mg (100 % of original dose 50 mg/m2), Intravenous, Once, 6 of 6 cycles Dose modification: 50 mg/m2 (original dose 50 mg/m2, Cycle 1, Reason: Provider Judgment), 50 mg/m2 (original dose 50 mg/m2, Cycle  2, Reason: Provider Judgment) Administration: 81.7 mg (10/21/2017), 81.7 mg (11/04/2017), 81.7 mg (11/18/2017), 81.7 mg (12/02/2017), 81.7 mg (12/16/2017), 81.7 mg (12/30/2017)  for chemotherapy treatment.       CANCER STAGING: Cancer Staging Pancreatic cancer metastasized to liver [facility]) Staging form: Pancreas, AJCC 7th Edition - Clinical: Stage IV (T3, N1, M1) - Signed by Baird Cancer, PA-C on 03/16/2014 - Pathologic: No stage assigned - Unsigned    INTERVAL HISTORY:  Ms. Duffy 84 y.o. female returns for routine follow-up for metastatic pancreatic cancer to the liver. Patient is here today for her next treatment. She is still having numbness on the bottom of her feet but this is stable at this time. She is having diarrhea 3-4 days at a time. She gets relief with imodium. When she doesn't have diarrhea she is constipated. Patient appetite is 25%. She maintaining her weight at this time. Patient energy level is 50%. Patient denies any mouth sores. Denies any nausea or vomiting. Denies any new pains. Denies any bleeding.     REVIEW OF SYSTEMS:  Review of Systems  Gastrointestinal: Positive for diarrhea.  Neurological: Positive for numbness.  All other systems reviewed and are negative.    PAST MEDICAL/SURGICAL HISTORY:  Past Medical History:  Diagnosis Date  . Anemia due to antineoplastic chemotherapy 09/12/2015   Started Aranesp 500 mcg on 09/12/2015  . Anxiety   . Chronic diarrhea   . Depression   . DNR (do not resuscitate) 08/16/2015  . Erosive esophagitis   . GERD (gastroesophageal reflux disease)   . Hx of adenomatous colonic polyps    tubular adenomas, last found in 2008  . Hyperplastic colon polyp 03/19/10   tcs by Dr. Rourk  . Hypertension 20 years   . Kidney stone    hx/ crushed   . Opioid contract exists 04/18/2015   With Dr. Simpson  . Pancreatic cancer (HCC) 02/2014  . Pancreatic cancer metastasized to liver (HCC) 03/10/2014  . Schatzki's ring 08/26/10   Last  dilated on EGD by Dr. Rourk->small HH, linear gastric erosions, BI hemigastrectomy   Past Surgical History:  Procedure Laterality Date  . BREAST LUMPECTOMY Left   . bunion removal     from both feet   . CHOLECYSTECTOMY  1965   . COLONOSCOPY  03/19/2010   DR ROURK,, normal TI, pancolonic diverticula, random colon bx neg., hyperplastic polyps removed  . ESOPHAGOGASTRODUODENOSCOPY  11/22/2003   DR ROURK, erosive RE, Billroth I  . ESOPHAGOGASTRODUODENOSCOPY  08/26/10   Dr. Rourk- moderate severe ERE, Scahtzki ring s/p dilation, Billroth I, linear gastric erosions, bx-gastric xanthelasma  . LEFT SHOULDER SURGERY  2009   DR HARRISON  . ORIF ANKLE FRACTURE Right 05/22/2013   Procedure: OPEN REDUCTION INTERNAL FIXATION (ORIF) RIGHT ANKLE FRACTURE;  Surgeon: Wayne Keeling, MD;  Location: [facility];  Service: Orthopedics;  Laterality: Right;  . stomach ulcer  50 years ago    had some of her stomach removed      SOCIAL HISTORY:  Social History   Socioeconomic History  . Marital status: Widowed    Spouse name: Not on file  . Number of children: 2  . Years of education: Not on file  . Highest education level: Not on file  Occupational History  . Occupation: retired from bakery    Employer: RETIRED  Social Needs  . Financial resource strain: Not on file  . Food insecurity:    Worry: Not on file    Inability: Not on file  . Transportation needs:    Medical: Not on file    Non-medical: Not on file  Tobacco Use  . Smoking status: Current Some Day Smoker    Packs/day: 0.50    Years: 60.00    Pack years: 30.00    Types: Cigarettes    Last attempt to quit: 09/09/2016    Years since quitting: 1.3  . Smokeless tobacco: Never Used  Substance and Sexual Activity  . Alcohol use: No  . Drug use: No  . Sexual activity: Never  Lifestyle  . Physical activity:    Days per week: Not on file    Minutes per session: Not on file  . Stress: Not on file  Relationships  . Social connections:     Talks on phone: Not on file    Gets together: Not on file    Attends religious service: Not on file    Active member of club or organization: Not on file    Attends meetings of clubs or organizations: Not on file    Relationship status: Not on file  . Intimate partner violence:    Fear of current or ex partner: Not on file    Emotionally abused: Not on file      Physically abused: Not on file    Forced sexual activity: Not on file  Other Topics Concern  . Not on file  Social History Narrative  . Not on file    FAMILY HISTORY:  Family History  Problem Relation Age of Onset  . Hypertension Mother   . Kidney failure Brother        on dialysis  . Diabetes Sister   . Diabetes Sister   . Hypertension Father   . Kidney failure Sister   . Kidney Stones Brother   . Breast cancer Son   . Gout Son   . Liver disease Neg Hx   . Colon cancer Neg Hx     CURRENT MEDICATIONS:  Outpatient Encounter Medications as of 12/30/2017  Medication Sig Note  . acetaminophen (TYLENOL) 500 MG tablet Take 500 mg by mouth every 6 (six) hours as needed for mild pain or moderate pain.    . amLODipine (NORVASC) 10 MG tablet TAKE 1 TABLET BY MOUTH EVERY DAY   . clonazePAM (KLONOPIN) 0.5 MG tablet TAKE 1 TABLET BY MOUTH DAILY AS NEEDED FOR ANXIETY   . cycloSPORINE (RESTASIS) 0.05 % ophthalmic emulsion Place 1 drop into both eyes 2 (two) times daily.   . diphenhydrAMINE (BENADRYL) 50 MG tablet Take 1 tablet 1 hour prior to CT   . dronabinol (MARINOL) 2.5 MG capsule Take 2.5 mg by mouth daily.   . metoprolol tartrate (LOPRESSOR) 25 MG tablet Take 1 tablet by mouth 2 (two) times daily. 12/02/2017: Taking once a day   . mirabegron ER (MYRBETRIQ) 25 MG TB24 tablet Take 1 tablet (25 mg total) by mouth daily.   . mirtazapine (REMERON) 7.5 MG tablet Take 1 tablet (7.5 mg total) by mouth at bedtime.   . nicotine (NICODERM CQ - DOSED IN MG/24 HR) 7 mg/24hr patch Place 1 patch (7 mg total) onto the skin daily.   .  olmesartan (BENICAR) 20 MG tablet Take 1 tablet (20 mg total) by mouth daily.   . oxybutynin (DITROPAN-XL) 5 MG 24 hr tablet TK 1 T PO QD   . pantoprazole (PROTONIX) 20 MG tablet TAKE 1 TABLET(20 MG) BY MOUTH DAILY   . potassium chloride (K-DUR) 10 MEQ tablet Take 1 tablet (10 mEq total) by mouth 2 (two) times daily.   . traMADol (ULTRAM) 50 MG tablet One tablet once daily, as needed, for uncontrolled pain   . [DISCONTINUED] prochlorperazine (COMPAZINE) 10 MG tablet Take 1 tablet (10 mg total) by mouth every 6 (six) hours as needed (Nausea or vomiting).    Facility-Administered Encounter Medications as of 12/30/2017  Medication  . dexamethasone (DECADRON) 10 mg in sodium chloride 0.9 % 50 mL IVPB    ALLERGIES:  Allergies  Allergen Reactions  . Iohexol Hives and Shortness Of Breath    PATIENT HAD TO BE TAKEN TO ED, C/O WHELPS, HIVES, DIFFICULTY BREATHING. PATIENT GIVEN IV CONTRAST AFTER 13 HOUR PRE MEDS ON 01/09/2017 WITH NO REACTION NOTED. On 10/19/17 patient had reaction despite premedication.    . Aciphex [Rabeprazole Sodium] Other (See Comments)    unknown  . Amlodipine Besylate-Valsartan     Rash to high-dose (5/320)  . Esomeprazole Magnesium   . Omeprazole Other (See Comments)    Patient states "medication didn't work"  . Ciprofloxacin Rash  . Penicillins Swelling and Rash    Has patient had a PCN reaction causing immediate rash, facial/tongue/throat swelling, SOB or lightheadedness with hypotension: Yes Has patient had a PCN reaction causing severe rash involving   mucus membranes or skin necrosis: Yes Has patient had a PCN reaction that required hospitalization: No Has patient had a PCN reaction occurring within the last 10 years: yes If all of the above answers are "NO", then may proceed with Cephalosporin use.      PHYSICAL EXAM:  ECOG Performance status: 1  Vitals:   12/30/17 0912  BP: (!) 172/60  Pulse: (!) 51  Resp: 20  Temp: 98.2 F (36.8 C)  SpO2: 100%    Filed Weights   12/30/17 0912  Weight: 122 lb (55.3 kg)    Physical Exam  Constitutional: She is oriented to person, place, and time. She appears well-developed and well-nourished.  Cardiovascular: Normal rate, regular rhythm and normal heart sounds.  Pulmonary/Chest: Effort normal and breath sounds normal.  Neurological: She is alert and oriented to person, place, and time.  Skin: Skin is warm and dry.     LABORATORY DATA:  I have reviewed the labs as listed.  CBC    Component Value Date/Time   WBC 4.7 12/30/2017 0842   RBC 3.24 (L) 12/30/2017 0842   HGB 9.5 (L) 12/30/2017 0842   HCT 29.0 (L) 12/30/2017 0842   PLT 118 (L) 12/30/2017 0842   MCV 89.5 12/30/2017 0842   MCH 29.3 12/30/2017 0842   MCHC 32.8 12/30/2017 0842   RDW 17.2 (H) 12/30/2017 0842   LYMPHSABS 1.1 12/30/2017 0842   MONOABS 0.4 12/30/2017 0842   EOSABS 0.1 12/30/2017 0842   BASOSABS 0.0 12/30/2017 0842   CMP Latest Ref Rng & Units 12/30/2017 12/16/2017 12/02/2017  Glucose 70 - 99 mg/dL 106(H) 106(H) 112(H)  BUN 8 - 23 mg/dL 24(H) 21 36(H)  Creatinine 0.44 - 1.00 mg/dL 1.50(H) 1.33(H) 1.40(H)  Sodium 135 - 145 mmol/L 143 139 142  Potassium 3.5 - 5.1 mmol/L 4.3 4.2 4.0  Chloride 98 - 111 mmol/L 107 106 109  CO2 22 - 32 mmol/L _0 Calcium 8.9 - 10.3 mg/dL 8.5(L) 8.7(L) 8.7(L)  Total Protein 6.5 - 8.1 g/dL 6.5 7.2 7.0  Total Bilirubin 0.3 - 1.2 mg/dL 0.6 0.6 0.6  Alkaline Phos 38 - 126 U/L 131(H) 142(H) 178(H)  AST 15 - 41 U/L _1 ALT 0 - 44 U/L _2 ASSESSMENT & PLAN:   Pancreatic cancer metastasized to liver ([place]) 1.  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 28 days started on 01/28/2017 -CT scan done on 07/13/2017 shows stable liver  disease. CA 19-9 was on 06/24/2017 at 129.  CA-19-9 at last visit went up to 171. - I discussed the latest CT scan reports which showed progression in the liver lesions which are very subtle.  There is progression in the tail of the pancreas lesion measuring 27 x 27 mm.  Even though the progression is very small, her CA 19-9 is continuing to go up, last one at 171.  Hence I have recommended a change in her treatment.  Gemcitabine and Tarceva were discontinued. - First cycle of infusional 5-FU and liposomal Irinotecan started on 10/21/2017, Onivyde started at a decreased dose of 50 mg/m. - We have cut back on infusional 5-FU dose by 20% after cycle 1 when she developed mucositis.  Since  then she has been tolerating it very well.  Cycle 3 was on 11/18/2017.  Her CA-19-9 has come down to 263 (11/18/2017) from 316 (11/04/2017). - She received cycle 5 on 12/16/2017.  She had diarrhea for 5 days, about 2-3 times per day.  She is taking Imodium which is controlling it well.  She may proceed with cycle 6 today.  I will see her back with CT scans in 2 weeks.  Her last CA 19-9 has come down to 236 on 12/16/2017. - At last visit she complained of pain in the right lateral thigh.  We have ordered x-rays.  Patient felt improvement in her pain and did not do the x-rays.  Today she complains of pain coming back.  We have ordered x-rays again. -Her creatinine went up slightly today to 1.5.  We will keep a close eye on it.  2.  Weight loss: She is taking Marinol 2.5 mg twice daily.  Her weight has been stable around 123 for the past few visits.  3.  Genetic testing: -I have recommended BRCA1/2 testing and MSI testing.      Orders placed this encounter:  Orders Placed This Encounter  Procedures  . CT Abdomen Pelvis W Wo Contrast  . CT Chest W Contrast  . Cancer antigen 19-9  . Lactate dehydrogenase  . CBC with Differential/Platelet  . Comprehensive metabolic panel      Derek Jack, MD [place] [phone]

## 2017-12-30 NOTE — Progress Notes (Signed)
[name] reviewed with and pt seen by Dr. Delton Coombes and pt approved for chemo tx today per MD                                                                                  [name] tolerated chemo tx well without complaints or incident. VSS upon discharge. Pt discharged with 5FU pump infusing without issues. Pt discharged self ambulatory using cane in satisfactory condition accompanied by family member

## 2017-12-30 NOTE — Assessment & Plan Note (Signed)
1.  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 28 days started on 01/28/2017 -CT scan done on 07/13/2017 shows stable liver disease. CA 19-9 was on 06/24/2017 at 129.  CA-19-9 at last visit went up to 171. - I discussed the latest CT scan reports which showed progression in the liver lesions which are very subtle.  There is progression in the tail of the pancreas lesion measuring 27 x 27 mm.  Even though the progression is very small, her CA 19-9 is continuing to go up, last one at 171.  Hence I have recommended a change in her treatment.  Gemcitabine and Tarceva were discontinued. - First cycle of infusional 5-FU and liposomal Irinotecan started on 10/21/2017, Onivyde started at a decreased dose of 50 mg/m. - We have cut back on infusional 5-FU dose by 20% after cycle 1 when she developed mucositis.  Since then she has been tolerating it very well.  Cycle 3 was on 11/18/2017.  Her CA-19-9 has come down to 263 (11/18/2017) from 316 (11/04/2017). - She received cycle 5 on 12/16/2017.  She had diarrhea for 5 days, about 2-3 times per day.  She is taking Imodium which is controlling it well.  She may proceed with cycle 6 today.  I will see her back with CT scans in 2 weeks.  Her last CA 19-9 has come down to 236 on 12/16/2017. - At last visit she complained of pain in the right lateral thigh.  We have ordered x-rays.  Patient felt improvement in her pain and did not do the x-rays.  Today she complains of pain coming back.  We have ordered x-rays again. -Her creatinine went up slightly today to 1.5.  We will keep a close eye on it.  2.  Weight loss: She is taking Marinol 2.5 mg twice daily.  Her weight has been stable around 123 for the past few visits.  3.  Genetic testing: -I  have recommended BRCA1/2 testing and MSI testing.

## 2017-12-30 NOTE — Patient Instructions (Signed)
[facility] Discharge Instructions for Patients Receiving Chemotherapy   Beginning January 23rd 2017 lab work for the Cancer Center will be done in the  Main lab at [place] on 1st floor. If you have a lab appointment with the Cancer Center please come in thru the  Main Entrance and check in at the main information desk   Today you received the following chemotherapy agents Irinotecan Liposomal, Leucovorin and 5FU. Follow-up as scheduled. Call clinic for any questions or concerns  To help prevent nausea and vomiting after your treatment, we encourage you to take your nausea medication   If you develop nausea and vomiting, or diarrhea that is not controlled by your medication, call the clinic.  The clinic phone number is [phone]. Office hours are Monday-Friday 8:30am-5:00pm.  BELOW ARE SYMPTOMS THAT SHOULD BE REPORTED IMMEDIATELY:  *FEVER GREATER THAN 101.0 F  *CHILLS WITH OR WITHOUT FEVER  NAUSEA AND VOMITING THAT IS NOT CONTROLLED WITH YOUR NAUSEA MEDICATION  *UNUSUAL SHORTNESS OF BREATH  *UNUSUAL BRUISING OR BLEEDING  TENDERNESS IN MOUTH AND THROAT WITH OR WITHOUT PRESENCE OF ULCERS  *URINARY PROBLEMS  *BOWEL PROBLEMS  UNUSUAL RASH Items with * indicate a potential emergency and should be followed up as soon as possible. If you have an emergency after office hours please contact your primary care physician or go to the nearest emergency department.  Please call the clinic during office hours if you have any questions or concerns.   You may also contact the Patient Navigator at [phone] should you have any questions or need assistance in obtaining follow up care.      Resources For Cancer Patients and their Caregivers ? American Cancer Society: Can assist with transportation, wigs, general needs, runs Look Good Feel Better.        1-888-227-6333 ? Cancer Care: Provides financial assistance, online support groups, medication/co-pay  assistance.  1-800-813-HOPE (4673) ? [facility] Assists [place] cancer patients and their families through emotional , educational and financial support.  336-427-4357 ? [place] DSS Where to apply for food stamps, Medicaid and utility assistance. 336-342-1394 ? [facility]: Transportation to medical appointments. 336-347-2287 ? Social Security Administration: May apply for disability if have a Stage IV cancer. 336-342-7796 1-800-772-1213 ? [place] Aging, Disability and Transit Services: Assists with nutrition, care and transit needs. 336-349-2343         

## 2017-12-30 NOTE — Patient Instructions (Signed)
[place] at [facility] Discharge Instructions  Follow up in 2 weeks for labs and treatment. Please have CT scans done before your visit.    Thank you for choosing [place] at [facility] to provide your oncology and hematology care.  To afford each patient quality time with our provider, please arrive at least 15 minutes before your scheduled appointment time.   If you have a lab appointment with the [place] please come in thru the  Main Entrance and check in at the main information desk  You need to re-schedule your appointment should you arrive 10 or more minutes late.  We strive to give you quality time with our providers, and arriving late affects you and other patients whose appointments are after yours.  Also, if you no show three or more times for appointments you may be dismissed from the clinic at the providers discretion.     Again, thank you for choosing [facility].  Our hope is that these requests will decrease the amount of time that you wait before being seen by our physicians.       _____________________________________________________________  Should you have questions after your visit to [facility], please contact our office at [phone] between the hours of 8:00 a.m. and 4:30 p.m.  Voicemails left after 4:00 p.m. will not be returned until the following business day.  For prescription refill requests, have your pharmacy contact our office and allow 72 hours.    Cancer Center Support Programs:   > Cancer Support Group  2nd Tuesday of the month 1pm-2pm, Journey Room

## 2017-12-31 LAB — CANCER ANTIGEN 19-9: CA 19-9: 206 U/mL — ABNORMAL HIGH (ref 0–35)

## 2018-01-01 ENCOUNTER — Inpatient Hospital Stay (HOSPITAL_COMMUNITY): Payer: Medicare HMO

## 2018-01-01 VITALS — BP 136/62 | HR 58 | Temp 97.8°F | Resp 16

## 2018-01-01 DIAGNOSIS — M85851 Other specified disorders of bone density and structure, right thigh: Secondary | ICD-10-CM | POA: Diagnosis not present

## 2018-01-01 DIAGNOSIS — C259 Malignant neoplasm of pancreas, unspecified: Secondary | ICD-10-CM

## 2018-01-01 DIAGNOSIS — Z5111 Encounter for antineoplastic chemotherapy: Secondary | ICD-10-CM | POA: Diagnosis not present

## 2018-01-01 DIAGNOSIS — R944 Abnormal results of kidney function studies: Secondary | ICD-10-CM | POA: Diagnosis not present

## 2018-01-01 DIAGNOSIS — C252 Malignant neoplasm of tail of pancreas: Secondary | ICD-10-CM | POA: Diagnosis not present

## 2018-01-01 DIAGNOSIS — M79651 Pain in right thigh: Secondary | ICD-10-CM | POA: Diagnosis not present

## 2018-01-01 DIAGNOSIS — R69 Illness, unspecified: Secondary | ICD-10-CM | POA: Diagnosis not present

## 2018-01-01 DIAGNOSIS — C787 Secondary malignant neoplasm of liver and intrahepatic bile duct: Secondary | ICD-10-CM | POA: Diagnosis not present

## 2018-01-01 DIAGNOSIS — Z9221 Personal history of antineoplastic chemotherapy: Secondary | ICD-10-CM | POA: Diagnosis not present

## 2018-01-01 DIAGNOSIS — Z66 Do not resuscitate: Secondary | ICD-10-CM | POA: Diagnosis not present

## 2018-01-01 MED ORDER — HEPARIN SOD (PORK) LOCK FLUSH 100 UNIT/ML IV SOLN
500.0000 [IU] | Freq: Once | INTRAVENOUS | Status: AC | PRN
  Administered 2018-01-01: 500 [IU]

## 2018-01-01 MED ORDER — SODIUM CHLORIDE 0.9% FLUSH
10.0000 mL | INTRAVENOUS | Status: DC | PRN
  Administered 2018-01-01: 10 mL
  Filled 2018-01-01: qty 10

## 2018-01-01 NOTE — Progress Notes (Signed)
[name] returns today for port de access and flush after 46 hr continous infusion of 5fu. Tolerated infusion without problems. Portacath located right chest wall was  deaccessed and flushed with 20ml NS and 500U/5ml Heparin and needle removed intact.  Procedure without incident. Patient tolerated procedure well.  Vitals stable and discharged home from clinic ambulatory. Follow up as scheduled.    

## 2018-01-01 NOTE — Patient Instructions (Signed)
[place] at [facility] Discharge Instructions  Continuous 5FU pump disconnected today.  Follow up as scheduled.    Thank you for choosing [place] at [facility] to provide your oncology and hematology care.  To afford each patient quality time with our provider, please arrive at least 15 minutes before your scheduled appointment time.   If you have a lab appointment with the [place] please come in thru the  Main Entrance and check in at the main information desk  You need to re-schedule your appointment should you arrive 10 or more minutes late.  We strive to give you quality time with our providers, and arriving late affects you and other patients whose appointments are after yours.  Also, if you no show three or more times for appointments you may be dismissed from the clinic at the providers discretion.     Again, thank you for choosing [facility].  Our hope is that these requests will decrease the amount of time that you wait before being seen by our physicians.       _____________________________________________________________  Should you have questions after your visit to [facility], please contact our office at [phone] between the hours of 8:00 a.m. and 4:30 p.m.  Voicemails left after 4:00 p.m. will not be returned until the following business day.  For prescription refill requests, have your pharmacy contact our office and allow 72 hours.    Cancer Center Support Programs:   > Cancer Support Group  2nd Tuesday of the month 1pm-2pm, Journey Room

## 2018-01-04 ENCOUNTER — Other Ambulatory Visit (HOSPITAL_COMMUNITY): Payer: Self-pay | Admitting: Nurse Practitioner

## 2018-01-04 ENCOUNTER — Ambulatory Visit: Payer: Medicare HMO

## 2018-01-05 ENCOUNTER — Other Ambulatory Visit (HOSPITAL_COMMUNITY): Payer: Self-pay | Admitting: *Deleted

## 2018-01-05 ENCOUNTER — Other Ambulatory Visit (HOSPITAL_COMMUNITY): Payer: Self-pay | Admitting: Nurse Practitioner

## 2018-01-05 DIAGNOSIS — C259 Malignant neoplasm of pancreas, unspecified: Secondary | ICD-10-CM

## 2018-01-05 DIAGNOSIS — C787 Secondary malignant neoplasm of liver and intrahepatic bile duct: Principal | ICD-10-CM

## 2018-01-05 MED ORDER — PREDNISONE 50 MG PO TABS: ORAL_TABLET | ORAL | 3 refills | Status: DC

## 2018-01-06 ENCOUNTER — Ambulatory Visit (HOSPITAL_COMMUNITY)
Admission: RE | Admit: 2018-01-06 | Discharge: 2018-01-06 | Disposition: A | Discharge status: Home / Self Care | Payer: Medicare HMO | Source: Ambulatory Visit | Attending: Nurse Practitioner | Admitting: Nurse Practitioner

## 2018-01-06 ENCOUNTER — Other Ambulatory Visit (HOSPITAL_COMMUNITY): Payer: Self-pay | Admitting: Nurse Practitioner

## 2018-01-06 DIAGNOSIS — M19012 Primary osteoarthritis, left shoulder: Secondary | ICD-10-CM | POA: Insufficient documentation

## 2018-01-06 DIAGNOSIS — C259 Malignant neoplasm of pancreas, unspecified: Secondary | ICD-10-CM

## 2018-01-06 DIAGNOSIS — M19011 Primary osteoarthritis, right shoulder: Secondary | ICD-10-CM | POA: Insufficient documentation

## 2018-01-06 DIAGNOSIS — I700 Atherosclerosis of aorta: Secondary | ICD-10-CM | POA: Insufficient documentation

## 2018-01-06 DIAGNOSIS — J439 Emphysema, unspecified: Secondary | ICD-10-CM | POA: Diagnosis not present

## 2018-01-06 DIAGNOSIS — D734 Cyst of spleen: Secondary | ICD-10-CM | POA: Diagnosis not present

## 2018-01-06 DIAGNOSIS — I864 Gastric varices: Secondary | ICD-10-CM | POA: Diagnosis not present

## 2018-01-06 DIAGNOSIS — R918 Other nonspecific abnormal finding of lung field: Secondary | ICD-10-CM | POA: Diagnosis not present

## 2018-01-06 DIAGNOSIS — N281 Cyst of kidney, acquired: Secondary | ICD-10-CM | POA: Diagnosis not present

## 2018-01-06 DIAGNOSIS — I2510 Atherosclerotic heart disease of native coronary artery without angina pectoris: Secondary | ICD-10-CM | POA: Diagnosis not present

## 2018-01-06 DIAGNOSIS — N200 Calculus of kidney: Secondary | ICD-10-CM | POA: Insufficient documentation

## 2018-01-06 DIAGNOSIS — C787 Secondary malignant neoplasm of liver and intrahepatic bile duct: Principal | ICD-10-CM

## 2018-01-11 ENCOUNTER — Encounter: Payer: Self-pay | Admitting: Family Medicine

## 2018-01-11 DIAGNOSIS — F17200 Nicotine dependence, unspecified, uncomplicated: Secondary | ICD-10-CM | POA: Insufficient documentation

## 2018-01-11 DIAGNOSIS — F409 Phobic anxiety disorder, unspecified: Secondary | ICD-10-CM | POA: Insufficient documentation

## 2018-01-11 DIAGNOSIS — F5105 Insomnia due to other mental disorder: Secondary | ICD-10-CM

## 2018-01-11 NOTE — Assessment & Plan Note (Signed)
Asked: confirms currently smokes cigarettes Assess: wants to quit ASSIST: Counseled for 5 mins, literature provided and Nicoderm prescribed Advise: needs to quit as has established metastatic cancer ,and to reduce heart disease and stroke risk Arrange : f/u in 2 months

## 2018-01-11 NOTE — Assessment & Plan Note (Signed)
Poor appetite and poor sleep, start low dose Remeron and review in 2 months Associated with dx of metastatic pancreatic cancer, however overall doing fairly well as she has an excellent family support system, son from [place] of [place] is spending more time with ehr and her sisters who live nearby are very attentive

## 2018-01-11 NOTE — Assessment & Plan Note (Signed)
Patient at increased fall risk Home safety reviewed, and she is advised to use cane at all times

## 2018-01-11 NOTE — Progress Notes (Signed)
   Elizabethanne Lusher     MRN: [id number]      DOB: [birth year]   HPI Ms. Hennigan is here for follow up and re-evaluation of chronic medical conditions, medication management and review of any available recent lab and radiology data.  Preventive health is updated, specifically  Cancer screening and Immunization.   Questions or concerns regarding consultations or procedures which the PT has had in the interim are  addressed. The PT denies any adverse reactions to current medications since the last visit.  C/o poor appetite and poor sleep , concerned about weight loss Still smoking and states she would like to quit, but finds it very difficult   ROS Denies recent fever or chills. Denies sinus pressure, nasal congestion, ear pain or sore throat. Denies chest congestion, productive cough or wheezing. Denies chest pains, palpitations and leg swelling Denies abdominal pain, nausea, vomiting,diarrhea or constipation.   Denies dysuria, frequency, hesitancy does have incontinence. C/o chronic  joint pain,  and limitation in mobility. Denies headaches, seizures, numbness, or tingling. C/o uncontrolled  anxiety and  Insomnia , also state that she has episodes of depression, but with support from family and friends these do not persist. Not suicidal or homicidal Denies skin break down or rash.   PE  BP (!) 150/78   Pulse (!) 57   Resp 12   Ht 5\' 3"  (1.6 m)   Wt 121 lb 1.9 oz (54.9 kg)   SpO2 97% Comment: room air  BMI 21.46 kg/m   Patient alert and oriented and in no cardiopulmonary distress.  HEENT: No facial asymmetry, EOMI,   oropharynx pink and moist.  Neck supple no JVD, no mass.  Chest: Clear to auscultation bilaterally.Decreased air entry  CVS: S1, S2 no murmurs, no S3.Regular rate.  ABD: Soft non tender.   Ext: No edema  MS: Decreased  ROM spine, shoulders, hips and knees.  Skin: Intact, no ulcerations or rash noted.  Psych: Good eye contact, normal affect. Mildly anxious and   depressed appearing.  CNS: CN 2-12 intact, power,  normal throughout.no focal deficits noted.   Assessment & Plan  Essential hypertension Adequately controlled on current regime , continue same  Primary generalized (osteo)arthritis Patient at increased fall risk Home safety reviewed, and she is advised to use cane at all times  Insomnia due to anxiety and fear Poor sleep due to anxiety as wellas mild depression, sleep hygiene reviewed, start Remeron at bedtime  Depression (emotion) Poor appetite and poor sleep, start low dose Remeron and review in 2 months Associated with dx of metastatic pancreatic cancer, however overall doing fairly well as she has an excellent family support system, son from out of State is spending more time with ehr and her sisters who live nearby are very attentive  Nicotine dependence Asked: confirms currently smokes cigarettes Assess: wants to quit ASSIST: Counseled for 5 mins, literature provided and Nicoderm prescribed Advise: needs to quit as has established metastatic cancer ,and to reduce heart disease and stroke risk Arrange : f/u in 2 months

## 2018-01-11 NOTE — Assessment & Plan Note (Signed)
Poor sleep due to anxiety as wellas mild depression, sleep hygiene reviewed, start Remeron at bedtime

## 2018-01-11 NOTE — Assessment & Plan Note (Signed)
Adequately controlled on current regime , continue same

## 2018-01-13 ENCOUNTER — Inpatient Hospital Stay (HOSPITAL_COMMUNITY): Payer: Medicare HMO | Attending: Hematology

## 2018-01-13 ENCOUNTER — Other Ambulatory Visit: Payer: Self-pay

## 2018-01-13 ENCOUNTER — Inpatient Hospital Stay (HOSPITAL_BASED_OUTPATIENT_CLINIC_OR_DEPARTMENT_OTHER): Payer: Medicare HMO | Admitting: Hematology

## 2018-01-13 ENCOUNTER — Encounter (HOSPITAL_COMMUNITY): Payer: Self-pay | Admitting: Hematology

## 2018-01-13 ENCOUNTER — Inpatient Hospital Stay (HOSPITAL_COMMUNITY): Payer: Medicare HMO

## 2018-01-13 VITALS — BP 185/61 | HR 57 | Temp 98.2°F | Resp 18

## 2018-01-13 DIAGNOSIS — C259 Malignant neoplasm of pancreas, unspecified: Secondary | ICD-10-CM

## 2018-01-13 DIAGNOSIS — C787 Secondary malignant neoplasm of liver and intrahepatic bile duct: Secondary | ICD-10-CM | POA: Diagnosis not present

## 2018-01-13 DIAGNOSIS — R634 Abnormal weight loss: Secondary | ICD-10-CM | POA: Diagnosis not present

## 2018-01-13 DIAGNOSIS — C252 Malignant neoplasm of tail of pancreas: Secondary | ICD-10-CM | POA: Insufficient documentation

## 2018-01-13 DIAGNOSIS — Z5111 Encounter for antineoplastic chemotherapy: Secondary | ICD-10-CM | POA: Diagnosis not present

## 2018-01-13 LAB — COMPREHENSIVE METABOLIC PANEL
ALT: 12 U/L (ref 0–44)
AST: 15 U/L (ref 15–41)
Albumin: 3.4 g/dL — ABNORMAL LOW (ref 3.5–5.0)
Alkaline Phosphatase: 127 U/L — ABNORMAL HIGH (ref 38–126)
Anion gap: 6 (ref 5–15)
BUN: 24 mg/dL — AB (ref 8–23)
CHLORIDE: 108 mmol/L (ref 98–111)
CO2: 29 mmol/L (ref 22–32)
CREATININE: 1.26 mg/dL — AB (ref 0.44–1.00)
Calcium: 8.4 mg/dL — ABNORMAL LOW (ref 8.9–10.3)
GFR calc non Af Amer: 37 mL/min — ABNORMAL LOW (ref >60)
GFR, EST AFRICAN AMERICAN: 43 mL/min — AB (ref >60)
GLUCOSE: 102 mg/dL — AB (ref 70–99)
Potassium: 4.1 mmol/L (ref 3.5–5.1)
SODIUM: 143 mmol/L (ref 135–145)
Total Bilirubin: 0.9 mg/dL (ref 0.3–1.2)
Total Protein: 6.6 g/dL (ref 6.5–8.1)

## 2018-01-13 LAB — CBC WITH DIFFERENTIAL/PLATELET
Basophils Absolute: 0 10*3/uL (ref 0.0–0.1)
Basophils Relative: 0 %
EOS ABS: 0.1 10*3/uL (ref 0.0–0.7)
EOS PCT: 2 %
HCT: 29.5 % — ABNORMAL LOW (ref 36.0–46.0)
Hemoglobin: 9.7 g/dL — ABNORMAL LOW (ref 12.0–15.0)
LYMPHS ABS: 1.5 10*3/uL (ref 0.7–4.0)
LYMPHS PCT: 26 %
MCH: 29.3 pg (ref 26.0–34.0)
MCHC: 32.9 g/dL (ref 30.0–36.0)
MCV: 89.1 fL (ref 78.0–100.0)
MONO ABS: 0.6 10*3/uL (ref 0.1–1.0)
Monocytes Relative: 11 %
Neutro Abs: 3.4 10*3/uL (ref 1.7–7.7)
Neutrophils Relative %: 61 %
PLATELETS: 115 10*3/uL — AB (ref 150–400)
RBC: 3.31 MIL/uL — AB (ref 3.87–5.11)
RDW: 17.6 % — ABNORMAL HIGH (ref 11.5–15.5)
WBC: 5.6 10*3/uL (ref 4.0–10.5)

## 2018-01-13 LAB — LACTATE DEHYDROGENASE: LDH: 138 U/L (ref 98–192)

## 2018-01-13 MED ORDER — SODIUM CHLORIDE 0.9 % IV SOLN
Freq: Once | INTRAVENOUS | Status: AC
  Administered 2018-01-13: 13:00:00 via INTRAVENOUS
  Filled 2018-01-13: qty 4

## 2018-01-13 MED ORDER — SODIUM CHLORIDE 0.9 % IV SOLN
50.0000 mg/m2 | Freq: Once | INTRAVENOUS | Status: AC
  Administered 2018-01-13: 81.7 mg via INTRAVENOUS
  Filled 2018-01-13: qty 19

## 2018-01-13 MED ORDER — LEUCOVORIN CALCIUM INJECTION 350 MG
700.0000 mg | Freq: Once | INTRAVENOUS | Status: AC
  Administered 2018-01-13: 700 mg via INTRAVENOUS
  Filled 2018-01-13: qty 35

## 2018-01-13 MED ORDER — SODIUM CHLORIDE 0.9% FLUSH
10.0000 mL | INTRAVENOUS | Status: DC | PRN
  Administered 2018-01-13: 10 mL
  Filled 2018-01-13: qty 10

## 2018-01-13 MED ORDER — DEXAMETHASONE SODIUM PHOSPHATE 10 MG/ML IJ SOLN: 10.0000 mg | Freq: Once | INTRAMUSCULAR | Status: DC

## 2018-01-13 MED ORDER — SODIUM CHLORIDE 0.9 % IV SOLN
Freq: Once | INTRAVENOUS | Status: DC
  Filled 2018-01-13: qty 4

## 2018-01-13 MED ORDER — SODIUM CHLORIDE 0.9 % IV SOLN
Freq: Once | INTRAVENOUS | Status: AC
  Administered 2018-01-13: 12:00:00 via INTRAVENOUS

## 2018-01-13 MED ORDER — SODIUM CHLORIDE 0.9 % IV SOLN
1920.0000 mg/m2 | INTRAVENOUS | Status: DC
  Administered 2018-01-13: 3100 mg via INTRAVENOUS
  Filled 2018-01-13: qty 52

## 2018-01-13 MED ORDER — SODIUM CHLORIDE 0.9 % IV SOLN
10.0000 mg | Freq: Once | INTRAVENOUS | Status: DC
  Filled 2018-01-13: qty 1

## 2018-01-13 NOTE — Patient Instructions (Signed)
[facility] at [facility] Discharge Instructions     Thank you for choosing [facility] at [facility] to provide your oncology and hematology care.  To afford each patient quality time with our provider, please arrive at least 15 minutes before your scheduled appointment time.   If you have a lab appointment with the Cancer Center please come in thru the  Main Entrance and check in at the main information desk  You need to re-schedule your appointment should you arrive 10 or more minutes late.  We strive to give you quality time with our providers, and arriving late affects you and other patients whose appointments are after yours.  Also, if you no show three or more times for appointments you may be dismissed from the clinic at the providers discretion.     Again, thank you for choosing [facility].  Our hope is that these requests will decrease the amount of time that you wait before being seen by our physicians.       _____________________________________________________________  Should you have questions after your visit to [facility], please contact our office at [phone] between the hours of 8:00 a.m. and 4:30 p.m.  Voicemails left after 4:00 p.m. will not be returned until the following business day.  For prescription refill requests, have your pharmacy contact our office and allow 72 hours.    Cancer Center Support Programs:   > Cancer Support Group  2nd Tuesday of the month 1pm-2pm, Journey Room    

## 2018-01-13 NOTE — Progress Notes (Signed)
[name], [name] [address]   CLINIC:  Medical Oncology/Hematology  PCP:  Fayrene Helper, MD [address], [place] [address] [phone]   REASON FOR VISIT: Follow-up for metastatic pancreatic cancer to the liver  CURRENT THERAPY: Infusional 5FU and liposomal Irinotecan   BRIEF ONCOLOGIC HISTORY:    Pancreatic cancer metastasized to liver ([place])   03/10/2014 Initial Diagnosis    Pancreatic cancer metastasized to liver    03/22/2014 - 03/05/2016 Chemotherapy    Abraxane/Gemzar days 1, 8, every 28 days.  Day 15 was cancelled due to leukopenia and thrombocytopenia on day 15 cycle 1.    05/24/2014 Treatment Plan Change    Day 8 of cycle 3 is held with ANC of 1.1    06/05/2014 Imaging    CT C/A/P . Interval decrease in size of the pancreatic tail mass.Improved hepatic metastatic disease. No new lesions. No CT findings for metastatic disease involving the chest.    08/09/2014 Tumor Marker    CA 19-9= 33 (WNL)    10/26/2014 Imaging    MRI- Continued interval decrease in size of the hepatic metastatic lesions and no new lesions are identified. Continued decrease in size of the pancreatic tail lesion.    01/03/2015 Tumor Marker    Results for SHELBE, HAGLUND (MRN [id number]) as of 01/18/2015 12:52  01/03/2015 10:00 CA 19-9: 14     01/17/2015 Imaging    MRI- Response to therapy of hepatic metastasis.  Similar size of a pancreatic tail lesion.    03/20/2015 Imaging    MRI-L spine- Severe disc space narrowing at L2-L3, with endplate reactive changes. Large disc extrusion into the ventral epidural space,central to the RIGHT with a cephalad migrated free fragment. Additional Large disc extrusion into the retroperitone...    03/22/2015 Imaging    CT pelvis- No evidence for metastatic disease within the pelvis.    07/24/2015 Imaging    MRI abd- Continued response to therapy, with no residual detectable liver metastases. No new sites  of metastatic disease in the abdomen.     08/15/2015 Code Status     She confirms desire for DNR status.    12/21/2015 Imaging    MRI liver- Severe image degradation due to motion artifact reducing diagnostic sensitivity and specificity. Reduce conspicuity of the pancreatic tail lesion suggesting further improvement. The original liver lesions have resolved.    03/05/2016 Treatment Plan Change    Chemotherapy holiday/Break after 2 years worth of treatment    04/07/2016 Imaging    CT chest- When compared to recent chest CT, new minimally displaced anterior left sixth rib fracture. Slight increase in subcarinal adenopathy    04/28/2016 Imaging    Bone density- BMD as determined from Femur Neck Right is 0.705 g/cm2 with a T-Score of -2.4. This patient is considered osteopenic according to [facility]) criteria. Compared with the prior study on 02/23/2013, the BMD of the lumbar spine/rt. femoral neck show a statistically significant decrease.     05/23/2016 Imaging    MRI abd- 1. Exam is significantly degraded by patient respiratory motion. Consider follow-up exams with CT abdomen with without contrast per pancreatic protocol. 2. Fullness in tail of pancreas is more prominent and potentially increased in size. Recommend close attention on follow-up. (Consider CT as above) 3. No explanation for back pain.    09/11/2016 Imaging    MRI pancreas- Interval progression of pancreatic tail lesion. New differential perfusion right liver  with areas of heterogeneity. Underlying metastatic disease in this region not excluded.    09/11/2016 Progression    MRi in conjunction with rising CA 19-9 are indicative of relapse of disease.    01/09/2017 Progression    CT C/A/P: 2.1 x 2.9 cm lesion in the pancreatic tail and abutting the splenic hilum, corresponding to known primary pancreatic neoplasm, mildly increased.  Scattered small hypoenhancing lesions in the liver measuring up  to 10 mm, approximately 8-10 in number, suspicious for hepatic metastases.  No findings specific for metastatic disease in the chest.  Additional ancillary findings as above.     01/27/2017 - 10/20/2017 Chemotherapy    The patient had gemcitabine (GEMZAR) 1,292 mg in sodium chloride 0.9 % 250 mL chemo infusion, 800 mg/m2 = 1,292 mg, Intravenous,  Once, 9 of 12 cycles Administration: 1,292 mg (01/28/2017), 1,292 mg (02/11/2017), 1,292 mg (02/25/2017), 1,292 mg (03/11/2017), 1,292 mg (03/25/2017), 1,292 mg (04/08/2017), 1,292 mg (04/22/2017), 1,292 mg (04/29/2017), 1,292 mg (05/20/2017), 1,292 mg (06/03/2017), 1,292 mg (06/24/2017), 1,292 mg (07/08/2017), 1,292 mg (07/29/2017), 1,292 mg (08/12/2017), 1,292 mg (08/26/2017), 1,292 mg (09/09/2017), 1,292 mg (09/23/2017), 1,292 mg (10/07/2017)  for chemotherapy treatment.     10/20/2017 -  Chemotherapy    The patient had leucovorin 700 mg in dextrose 5 % 250 mL infusion, 644 mg, Intravenous,  Once, 7 of 10 cycles Administration: 700 mg (10/21/2017), 700 mg (11/04/2017), 700 mg (11/18/2017), 700 mg (12/02/2017), 700 mg (12/16/2017), 700 mg (12/30/2017) ondansetron (ZOFRAN) 8 mg in sodium chloride 0.9 % 50 mL IVPB, , Intravenous,  Once, 6 of 9 cycles Administration:  (11/04/2017) fluorouracil (ADRUCIL) 3,850 mg in sodium chloride 0.9 % 73 mL chemo infusion, 2,400 mg/m2 = 3,850 mg, Intravenous, 1 Day/Dose, 7 of 10 cycles Dose modification: 1,920 mg/m2 (original dose 2,400 mg/m2, Cycle 2, Reason: Provider Judgment, Comment: mucisitis) Administration: 3,850 mg (10/21/2017), 3,100 mg (11/04/2017), 3,100 mg (11/18/2017), 3,100 mg (12/02/2017), 3,100 mg (12/16/2017), 3,100 mg (12/30/2017) irinotecan LIPOSOME (ONIVYDE) 81.7 mg in sodium chloride 0.9 % 500 mL chemo infusion, 50 mg/m2 = 81.7 mg (100 % of original dose 50 mg/m2), Intravenous, Once, 7 of 10 cycles Dose modification: 50 mg/m2 (original dose 50 mg/m2, Cycle 1, Reason: Provider Judgment), 50 mg/m2 (original dose 50 mg/m2, Cycle  2, Reason: Provider Judgment) Administration: 81.7 mg (10/21/2017), 81.7 mg (11/04/2017), 81.7 mg (11/18/2017), 81.7 mg (12/02/2017), 81.7 mg (12/16/2017), 81.7 mg (12/30/2017)  for chemotherapy treatment.       CANCER STAGING: Cancer Staging Pancreatic cancer metastasized to liver [facility]) Staging form: Pancreas, AJCC 7th Edition - Clinical: Stage IV (T3, N1, M1) - Signed by Baird Cancer, PA-C on 03/16/2014 - Pathologic: No stage assigned - Unsigned    INTERVAL HISTORY:  [name] 84 y.o. female returns for routine follow-up for metastatic pancreatic cancer to the liver. Patient is here today with her son. She is fatigued and has weakness. She has occasional constipation. She is tolerating treatment well and with very few side effects. She does still have occasional leg pain that comes and goes. She had an x-ray last visit which it was normal. Patient reports her appetite and energy level at 50%. She is maintaining her weight at this time. She denies any nausea, vomiting, or diarrhea. Denies any new pains. Denies any fevers or recent infections.     REVIEW OF SYSTEMS:  Review of Systems  Constitutional: Positive for fatigue.  Cardiovascular: Positive for leg swelling.  Gastrointestinal: Positive for constipation.  Neurological: Positive for dizziness.  All other  systems reviewed and are negative.    PAST MEDICAL/SURGICAL HISTORY:  Past Medical History:  Diagnosis Date  . Anemia due to antineoplastic chemotherapy 09/12/2015   Started Aranesp 500 mcg on 09/12/2015  . Anxiety   . Chronic diarrhea   . Depression   . DNR (do not resuscitate) 08/16/2015  . Erosive esophagitis   . GERD (gastroesophageal reflux disease)   . Hx of adenomatous colonic polyps    tubular adenomas, last found in 2008  . Hyperplastic colon polyp 03/19/10   tcs by Dr. Gala Romney  . Hypertension 20 years   . Kidney stone    hx/ crushed   . Opioid contract exists 04/18/2015   With Dr. Moshe Cipro  . Pancreatic cancer  ([place]) 02/2014  . Pancreatic cancer metastasized to liver ([place]) 03/10/2014  . Schatzki's ring 08/26/10   Last dilated on EGD by Dr. Trevor Iha [facility], linear gastric erosions, BI hemigastrectomy   Past Surgical History:  Procedure Laterality Date  . BREAST LUMPECTOMY Left   . bunion removal     from both feet   . CHOLECYSTECTOMY  1965   . COLONOSCOPY  03/19/2010   DR Gala Romney,, normal TI, pancolonic diverticula, random colon bx neg., hyperplastic polyps removed  . ESOPHAGOGASTRODUODENOSCOPY  11/22/2003   DR Gala Romney, erosive RE, Billroth I  . ESOPHAGOGASTRODUODENOSCOPY  08/26/10   Dr. Gala Romney- moderate severe ERE, Scahtzki ring s/p dilation, Billroth I, linear gastric erosions, bx-gastric xanthelasma  . LEFT SHOULDER SURGERY  2009   DR HARRISON  . ORIF ANKLE FRACTURE Right 05/22/2013   Procedure: OPEN REDUCTION INTERNAL FIXATION (ORIF) RIGHT ANKLE FRACTURE;  Surgeon: Sanjuana Kava, MD;  Location: [facility];  Service: Orthopedics;  Laterality: Right;  . stomach ulcer  50 years ago    had some of her stomach removed      SOCIAL HISTORY:  Social History   Socioeconomic History  . Marital status: Widowed    Spouse name: Not on file  . Number of children: 2  . Years of education: Not on file  . Highest education level: Not on file  Occupational History  . Occupation: retired from Estate manager/land agent: RETIRED  Social Needs  . Financial resource strain: Not on file  . Food insecurity:    Worry: Not on file    Inability: Not on file  . Transportation needs:    Medical: Not on file    Non-medical: Not on file  Tobacco Use  . Smoking status: Current Some Day Smoker    Packs/day: 0.50    Years: 60.00    Pack years: 30.00    Types: Cigarettes    Last attempt to quit: 09/09/2016    Years since quitting: 1.3  . Smokeless tobacco: Never Used  Substance and Sexual Activity  . Alcohol use: No  . Drug use: No  . Sexual activity: Never  Lifestyle  . Physical activity:    Days per week: Not on  file    Minutes per session: Not on file  . Stress: Not on file  Relationships  . Social connections:    Talks on phone: Not on file    Gets together: Not on file    Attends religious service: Not on file    Active member of club or organization: Not on file    Attends meetings of clubs or organizations: Not on file    Relationship status: Not on file  . Intimate partner violence:    Fear of current or ex partner: Not  on file    Emotionally abused: Not on file    Physically abused: Not on file    Forced sexual activity: Not on file  Other Topics Concern  . Not on file  Social History Narrative  . Not on file    FAMILY HISTORY:  Family History  Problem Relation Age of Onset  . Hypertension Mother   . Kidney failure Brother        on dialysis  . Diabetes Sister   . Diabetes Sister   . Hypertension Father   . Kidney failure Sister   . Kidney Stones Brother   . Breast cancer Son   . Gout Son   . Liver disease Neg Hx   . Colon cancer Neg Hx     CURRENT MEDICATIONS:  Outpatient Encounter Medications as of 01/13/2018  Medication Sig Note  . acetaminophen (TYLENOL) 500 MG tablet Take 500 mg by mouth every 6 (six) hours as needed for mild pain or moderate pain.    Marland Kitchen amLODipine (NORVASC) 10 MG tablet TAKE 1 TABLET BY MOUTH EVERY DAY   . clonazePAM (KLONOPIN) 0.5 MG tablet TAKE 1 TABLET BY MOUTH DAILY AS NEEDED FOR ANXIETY   . cycloSPORINE (RESTASIS) 0.05 % ophthalmic emulsion Place 1 drop into both eyes 2 (two) times daily.   . diphenhydrAMINE (BENADRYL) 50 MG tablet Take 1 tablet 1 hour prior to CT   . dronabinol (MARINOL) 2.5 MG capsule Take 2.5 mg by mouth daily.   . metoprolol tartrate (LOPRESSOR) 25 MG tablet Take 1 tablet by mouth 2 (two) times daily. 12/02/2017: Taking once a day   . mirabegron ER (MYRBETRIQ) 25 MG TB24 tablet Take 1 tablet (25 mg total) by mouth daily.   . mirtazapine (REMERON) 7.5 MG tablet Take 1 tablet (7.5 mg total) by mouth at bedtime.   . nicotine  (NICODERM CQ - DOSED IN MG/24 HR) 7 mg/24hr patch Place 1 patch (7 mg total) onto the skin daily.   Marland Kitchen olmesartan (BENICAR) 20 MG tablet Take 1 tablet (20 mg total) by mouth daily.   . pantoprazole (PROTONIX) 20 MG tablet TAKE 1 TABLET(20 MG) BY MOUTH DAILY   . potassium chloride (K-DUR) 10 MEQ tablet Take 1 tablet (10 mEq total) by mouth 2 (two) times daily.   . predniSONE (DELTASONE) 50 MG tablet Take one tab at 11pm, one tab at 5am and then one tab at 11am   . traMADol (ULTRAM) 50 MG tablet One tablet once daily, as needed, for uncontrolled pain   . [DISCONTINUED] prochlorperazine (COMPAZINE) 10 MG tablet Take 1 tablet (10 mg total) by mouth every 6 (six) hours as needed (Nausea or vomiting).    Facility-Administered Encounter Medications as of 01/13/2018  Medication  . dexamethasone (DECADRON) 10 mg in sodium chloride 0.9 % 50 mL IVPB    ALLERGIES:  Allergies  Allergen Reactions  . Iohexol Hives and Shortness Of Breath    **Patient can not have IV contrast, pt has breakthrough reaction even after 13 hour premeds** Do not give IV contrast PATIENT HAD TO BE TAKEN TO ED, C/O WHELPS, HIVES, DIFFICULTY BREATHING. PATIENT GIVEN IV CONTRAST AFTER 13 HOUR PRE MEDS ON 01/09/2017 WITH NO REACTION NOTED. On 10/19/17 patient had reaction despite premedication.    Marland Kitchen Aciphex [Rabeprazole Sodium] Other (See Comments)    unknown  . Amlodipine Besylate-Valsartan     Rash to high-dose (5/320)  . Esomeprazole Magnesium   . Omeprazole Other (See Comments)    Patient states "medication didn't  work"  . Ciprofloxacin Rash  . Penicillins Swelling and Rash    Has patient had a PCN reaction causing immediate rash, facial/tongue/throat swelling, SOB or lightheadedness with hypotension: Yes Has patient had a PCN reaction causing severe rash involving mucus membranes or skin necrosis: Yes Has patient had a PCN reaction that required hospitalization: No Has patient had a PCN reaction occurring within the last 10  years: yes If all of the above answers are "NO", then may proceed with Cephalosporin use.      PHYSICAL EXAM:  ECOG Performance status: 1  Vitals:   01/13/18 1133  BP: (!) 147/55  Pulse: (!) 57  Resp: 18  Temp: 98.3 F (36.8 C)  SpO2: 100%   Filed Weights   01/13/18 1133  Weight: 122 lb 1.6 oz (55.4 kg)    Physical Exam  Constitutional: She is oriented to person, place, and time. She appears well-developed and well-nourished.  Cardiovascular: Normal rate, regular rhythm and normal heart sounds.  Pulmonary/Chest: Effort normal and breath sounds normal.  Abdominal: Soft.  Musculoskeletal: Normal range of motion.  Neurological: She is alert and oriented to person, place, and time.  Skin: Skin is warm and dry.  Psychiatric: She has a normal mood and affect. Her behavior is normal. Judgment and thought content normal.     LABORATORY DATA:  I have reviewed the labs as listed.  CBC    Component Value Date/Time   WBC 5.6 01/13/2018 1058   RBC 3.31 (L) 01/13/2018 1058   HGB 9.7 (L) 01/13/2018 1058   HCT 29.5 (L) 01/13/2018 1058   PLT 115 (L) 01/13/2018 1058   MCV 89.1 01/13/2018 1058   MCH 29.3 01/13/2018 1058   MCHC 32.9 01/13/2018 1058   RDW 17.6 (H) 01/13/2018 1058   LYMPHSABS 1.5 01/13/2018 1058   MONOABS 0.6 01/13/2018 1058   EOSABS 0.1 01/13/2018 1058   BASOSABS 0.0 01/13/2018 1058   CMP Latest Ref Rng & Units 01/13/2018 12/30/2017 12/16/2017  Glucose 70 - 99 mg/dL 102(H) 106(H) 106(H)  BUN 8 - 23 mg/dL 24(H) 24(H) 21  Creatinine 0.44 - 1.00 mg/dL 1.26(H) 1.50(H) 1.33(H)  Sodium 135 - 145 mmol/L 143 143 139  Potassium 3.5 - 5.1 mmol/L 4.1 4.3 4.2  Chloride 98 - 111 mmol/L 108 107 106  CO2 22 - 32 mmol/L '29 29 27  ' Calcium 8.9 - 10.3 mg/dL 8.4(L) 8.5(L) 8.7(L)  Total Protein 6.5 - 8.1 g/dL 6.6 6.5 7.2  Total Bilirubin 0.3 - 1.2 mg/dL 0.9 0.6 0.6  Alkaline Phos 38 - 126 U/L 127(H) 131(H) 142(H)  AST 15 - 41 U/L '15 18 22  ' ALT 0 - 44 U/L '12 11 17        ' DIAGNOSTIC IMAGING:  I have personally reviewed images of the CT CAP dated 01/06/2018 and discussed with the patient.     ASSESSMENT & PLAN:   Pancreatic cancer metastasized to liver ([place]) 1.  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 28 days started on 01/28/2017 -CT scan done on 07/13/2017 shows stable liver disease. CA 19-9 was on 06/24/2017 at 129.  CA-19-9 at last visit went up to 171. - I discussed the latest CT scan reports which showed progression in the liver lesions which are very subtle.  There  is progression in the tail of the pancreas lesion measuring 27 x 27 mm.  Even though the progression is very small, her CA 19-9 is continuing to go up, last one at 171.  Hence I have recommended a change in her treatment.  Gemcitabine and Tarceva were discontinued. - First cycle of infusional 5-FU and liposomal Irinotecan started on 10/21/2017, Onivyde started at a decreased dose of 50 mg/m.  The dose of infusional 5-FU was reduced by 20% after cycle 1 secondary to mucositis. - Cycle 6 chemotherapy was on 12/30/2017.  She is tolerating it very well.  We did reviewed the results of CEA which has come down to 206 on 12/30/2017.  This was 316 in June. - We also went over CT scan of the CAP on 01/06/2018 done without contrast due to allergy, showed stable mass in the pancreatic tail.  Less distinctively seen hepatic metastasis secondary to lack of IV contrast.  No new metastasis.  This is consistent with the treatment response.  She was recommended to continue 2 more cycles and see [place] back in 1 month.  2.  Weight loss: She will continue Marinol 2.5 mg twice daily.  Weight has been stable for the past few months.    3.  Genetic testing: -I have recommended  BRCA1/2 testing and MSI testing.      Orders placed this encounter:  Orders Placed This Encounter  Procedures  . CBC with Differential/Platelet  . Comprehensive metabolic panel  . Cancer antigen 19-9  . Lactate dehydrogenase      Derek Jack, MD [place] [phone]

## 2018-01-13 NOTE — Patient Instructions (Addendum)
[place] at [facility]  Discharge Instructions:  You received Liposomal Irinotecan, Leucovorin, and 5FU today. Follow up as scheduled. Call clinic for any questions or concerns. _______________________________________________________________  Thank you for choosing [place] at [facility] to provide your oncology and hematology care.  To afford each patient quality time with our providers, please arrive at least 15 minutes before your scheduled appointment.  You need to re-schedule your appointment if you arrive 10 or more minutes late.  We strive to give you quality time with our providers, and arriving late affects you and other patients whose appointments are after yours.  Also, if you no show three or more times for appointments you may be dismissed from the clinic.  Again, thank you for choosing [place] at [place] hope is that these requests will allow you access to exceptional care and in a timely manner. _______________________________________________________________  If you have questions after your visit, please contact our office at [phone] between the hours of 8:30 a.m. and 5:00 p.m. Voicemails left after 4:30 p.m. will not be returned until the following business day. _______________________________________________________________  For prescription refill requests, have your pharmacy contact our office. _______________________________________________________________  Recommendations made by the consultant and any test results will be sent to your referring physician. _______________________________________________________________

## 2018-01-13 NOTE — Assessment & Plan Note (Signed)
1.  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 28 days started on 01/28/2017 -CT scan done on 07/13/2017 shows stable liver disease. CA 19-9 was on 06/24/2017 at 129.  CA-19-9 at last visit went up to 171. - I discussed the latest CT scan reports which showed progression in the liver lesions which are very subtle.  There is progression in the tail of the pancreas lesion measuring 27 x 27 mm.  Even though the progression is very small, her CA 19-9 is continuing to go up, last one at 171.  Hence I have recommended a change in her treatment.  Gemcitabine and Tarceva were discontinued. - First cycle of infusional 5-FU and liposomal Irinotecan started on 10/21/2017, Onivyde started at a decreased dose of 50 mg/m.  The dose of infusional 5-FU was reduced by 20% after cycle 1 secondary to mucositis. - Cycle 6 chemotherapy was on 12/30/2017.  She is tolerating it very well.  We did reviewed the results of CEA which has come down to 206 on 12/30/2017.  This was 316 in June. - We also went over CT scan of the CAP on 01/06/2018 done without contrast due to allergy, showed stable mass in the pancreatic tail.  Less distinctively seen hepatic metastasis secondary to lack of IV contrast.  No new metastasis.  This is consistent with the treatment response.  She was recommended to continue 2 more cycles and see [place] back in 1 month.  2.  Weight loss: She will continue Marinol 2.5 mg twice daily.  Weight has been stable for the past few months.    3.  Genetic testing: -I have recommended BRCA1/2 testing and MSI testing.

## 2018-01-13 NOTE — Progress Notes (Signed)
1200 Labs reviewed and patient seen by Dr. Delton Coombes who approved patient for treatment today. Iylah Causby tolerated Liposomal Irinotecan and Leucovorin infusions without incident or complaint. 5FU infusing on home infusion pump without difficulties upon discharge. VSS upon completion of treatment. Discharged self ambulatory in satisfactory condition.

## 2018-01-14 LAB — CANCER ANTIGEN 19-9: CAN 19-9: 172 U/mL — AB (ref 0–35)

## 2018-01-15 ENCOUNTER — Encounter (HOSPITAL_COMMUNITY): Payer: Self-pay

## 2018-01-15 ENCOUNTER — Inpatient Hospital Stay (HOSPITAL_COMMUNITY): Payer: Medicare HMO

## 2018-01-15 VITALS — BP 149/54 | HR 52 | Temp 98.5°F | Resp 16

## 2018-01-15 DIAGNOSIS — C259 Malignant neoplasm of pancreas, unspecified: Secondary | ICD-10-CM

## 2018-01-15 DIAGNOSIS — C787 Secondary malignant neoplasm of liver and intrahepatic bile duct: Principal | ICD-10-CM

## 2018-01-15 DIAGNOSIS — C252 Malignant neoplasm of tail of pancreas: Secondary | ICD-10-CM | POA: Diagnosis not present

## 2018-01-15 DIAGNOSIS — Z5111 Encounter for antineoplastic chemotherapy: Secondary | ICD-10-CM | POA: Diagnosis not present

## 2018-01-15 MED ORDER — SODIUM CHLORIDE 0.9% FLUSH
10.0000 mL | INTRAVENOUS | Status: DC | PRN
  Administered 2018-01-15: 10 mL
  Filled 2018-01-15: qty 10

## 2018-01-15 MED ORDER — HEPARIN SOD (PORK) LOCK FLUSH 100 UNIT/ML IV SOLN
500.0000 [IU] | Freq: Once | INTRAVENOUS | Status: AC | PRN
  Administered 2018-01-15: 500 [IU]

## 2018-01-15 NOTE — Patient Instructions (Signed)
[facility] at [facility] Discharge Instructions  5FU pump discontinued today with portacath flushed per protocol. Follow-up as scheduled. Call clinic for any questions or concerns   Thank you for choosing [facility] at [facility] to provide your oncology and hematology care.  To afford each patient quality time with our provider, please arrive at least 15 minutes before your scheduled appointment time.   If you have a lab appointment with the Cancer Center please come in thru the  Main Entrance and check in at the main information desk  You need to re-schedule your appointment should you arrive 10 or more minutes late.  We strive to give you quality time with our providers, and arriving late affects you and other patients whose appointments are after yours.  Also, if you no show three or more times for appointments you may be dismissed from the clinic at the providers discretion.     Again, thank you for choosing [facility].  Our hope is that these requests will decrease the amount of time that you wait before being seen by our physicians.       _____________________________________________________________  Should you have questions after your visit to  Cancer Center, please contact our office at [phone] between the hours of 8:00 a.m. and 4:30 p.m.  Voicemails left after 4:00 p.m. will not be returned until the following business day.  For prescription refill requests, have your pharmacy contact our office and allow 72 hours.    Cancer Center Support Programs:   > Cancer Support Group  2nd Tuesday of the month 1pm-2pm, Journey Room   

## 2018-01-15 NOTE — Progress Notes (Signed)
[name] tolerated 5FU pump well without complaints or incident. 5FU pump discontinued with portacath flushed with 10 ml NS and 5 ml Heparin easily per protocol then de-accessed. VSS Pt discharged self ambulatory using cane in satisfactory condition accompanied by family member

## 2018-01-21 DIAGNOSIS — C259 Malignant neoplasm of pancreas, unspecified: Secondary | ICD-10-CM | POA: Diagnosis not present

## 2018-01-21 DIAGNOSIS — C787 Secondary malignant neoplasm of liver and intrahepatic bile duct: Secondary | ICD-10-CM | POA: Diagnosis not present

## 2018-01-27 ENCOUNTER — Inpatient Hospital Stay (HOSPITAL_COMMUNITY): Payer: Medicare HMO

## 2018-01-27 ENCOUNTER — Encounter (HOSPITAL_COMMUNITY): Payer: Self-pay

## 2018-01-27 VITALS — BP 164/66 | HR 56 | Temp 97.9°F | Resp 18 | Wt 122.8 lb

## 2018-01-27 DIAGNOSIS — C787 Secondary malignant neoplasm of liver and intrahepatic bile duct: Secondary | ICD-10-CM | POA: Diagnosis not present

## 2018-01-27 DIAGNOSIS — C259 Malignant neoplasm of pancreas, unspecified: Secondary | ICD-10-CM

## 2018-01-27 DIAGNOSIS — C252 Malignant neoplasm of tail of pancreas: Secondary | ICD-10-CM | POA: Diagnosis not present

## 2018-01-27 DIAGNOSIS — Z5111 Encounter for antineoplastic chemotherapy: Secondary | ICD-10-CM | POA: Diagnosis not present

## 2018-01-27 LAB — COMPREHENSIVE METABOLIC PANEL
ALBUMIN: 3.5 g/dL (ref 3.5–5.0)
ALT: 10 U/L (ref 0–44)
AST: 17 U/L (ref 15–41)
Alkaline Phosphatase: 127 U/L — ABNORMAL HIGH (ref 38–126)
Anion gap: 7 (ref 5–15)
BUN: 21 mg/dL (ref 8–23)
CHLORIDE: 106 mmol/L (ref 98–111)
CO2: 27 mmol/L (ref 22–32)
Calcium: 8.6 mg/dL — ABNORMAL LOW (ref 8.9–10.3)
Creatinine, Ser: 1.46 mg/dL — ABNORMAL HIGH (ref 0.44–1.00)
GFR, EST AFRICAN AMERICAN: 36 mL/min — AB (ref >60)
GFR, EST NON AFRICAN AMERICAN: 31 mL/min — AB (ref >60)
Glucose, Bld: 108 mg/dL — ABNORMAL HIGH (ref 70–99)
Potassium: 4 mmol/L (ref 3.5–5.1)
SODIUM: 140 mmol/L (ref 135–145)
Total Bilirubin: 0.6 mg/dL (ref 0.3–1.2)
Total Protein: 6.8 g/dL (ref 6.5–8.1)

## 2018-01-27 LAB — CBC WITH DIFFERENTIAL/PLATELET
BASOS ABS: 0 10*3/uL (ref 0.0–0.1)
Basophils Relative: 0 %
EOS PCT: 4 %
Eosinophils Absolute: 0.2 10*3/uL (ref 0.0–0.7)
HEMATOCRIT: 30.6 % — AB (ref 36.0–46.0)
Hemoglobin: 10 g/dL — ABNORMAL LOW (ref 12.0–15.0)
LYMPHS PCT: 28 %
Lymphs Abs: 1.4 10*3/uL (ref 0.7–4.0)
MCH: 29.3 pg (ref 26.0–34.0)
MCHC: 32.7 g/dL (ref 30.0–36.0)
MCV: 89.7 fL (ref 78.0–100.0)
MONOS PCT: 8 %
Monocytes Absolute: 0.4 10*3/uL (ref 0.1–1.0)
Neutro Abs: 3 10*3/uL (ref 1.7–7.7)
Neutrophils Relative %: 60 %
Platelets: 126 10*3/uL — ABNORMAL LOW (ref 150–400)
RBC: 3.41 MIL/uL — AB (ref 3.87–5.11)
RDW: 17.2 % — ABNORMAL HIGH (ref 11.5–15.5)
WBC: 5 10*3/uL (ref 4.0–10.5)

## 2018-01-27 MED ORDER — SODIUM CHLORIDE 0.9 % IV SOLN
Freq: Once | INTRAVENOUS | Status: AC
  Administered 2018-01-27: 10:00:00 via INTRAVENOUS
  Filled 2018-01-27: qty 4

## 2018-01-27 MED ORDER — SODIUM CHLORIDE 0.9 % IV SOLN
50.0000 mg/m2 | Freq: Once | INTRAVENOUS | Status: AC
  Administered 2018-01-27: 81.7 mg via INTRAVENOUS
  Filled 2018-01-27: qty 19

## 2018-01-27 MED ORDER — SODIUM CHLORIDE 0.9 % IV SOLN
Freq: Once | INTRAVENOUS | Status: AC
  Administered 2018-01-27: 10:00:00 via INTRAVENOUS

## 2018-01-27 MED ORDER — SODIUM CHLORIDE 0.9% FLUSH
10.0000 mL | INTRAVENOUS | Status: DC | PRN
  Administered 2018-01-27: 10 mL
  Filled 2018-01-27: qty 10

## 2018-01-27 MED ORDER — LEUCOVORIN CALCIUM INJECTION 350 MG
700.0000 mg | Freq: Once | INTRAVENOUS | Status: AC
  Administered 2018-01-27: 700 mg via INTRAVENOUS
  Filled 2018-01-27: qty 35

## 2018-01-27 MED ORDER — SODIUM CHLORIDE 0.9 % IV SOLN
1920.0000 mg/m2 | INTRAVENOUS | Status: DC
  Administered 2018-01-27: 3100 mg via INTRAVENOUS
  Filled 2018-01-27: qty 62

## 2018-01-27 NOTE — Patient Instructions (Signed)
[facility] Discharge Instructions for Patients Receiving Chemotherapy   Beginning January 23rd 2017 lab work for the Cancer Center will be done in the  Main lab at [place] on 1st floor. If you have a lab appointment with the Cancer Center please come in thru the  Main Entrance and check in at the main information desk   Today you received the following chemotherapy agents Irinotecan Liposomal, Leucovorin and 5FU. Follow-up as scheduled. Call clinic for any questions or concerns  To help prevent nausea and vomiting after your treatment, we encourage you to take your nausea medication   If you develop nausea and vomiting, or diarrhea that is not controlled by your medication, call the clinic.  The clinic phone number is [phone]. Office hours are Monday-Friday 8:30am-5:00pm.  BELOW ARE SYMPTOMS THAT SHOULD BE REPORTED IMMEDIATELY:  *FEVER GREATER THAN 101.0 F  *CHILLS WITH OR WITHOUT FEVER  NAUSEA AND VOMITING THAT IS NOT CONTROLLED WITH YOUR NAUSEA MEDICATION  *UNUSUAL SHORTNESS OF BREATH  *UNUSUAL BRUISING OR BLEEDING  TENDERNESS IN MOUTH AND THROAT WITH OR WITHOUT PRESENCE OF ULCERS  *URINARY PROBLEMS  *BOWEL PROBLEMS  UNUSUAL RASH Items with * indicate a potential emergency and should be followed up as soon as possible. If you have an emergency after office hours please contact your primary care physician or go to the nearest emergency department.  Please call the clinic during office hours if you have any questions or concerns.   You may also contact the Patient Navigator at [phone] should you have any questions or need assistance in obtaining follow up care.      Resources For Cancer Patients and their Caregivers ? American [employer] Society: Can assist with transportation, wigs, general needs, runs Look Good Feel Better.        1-888-227-6333 ? Cancer Care: Provides financial assistance, online support groups, medication/co-pay  assistance.  1-800-813-HOPE (4673) ? [facility] Assists [place] cancer patients and their families through emotional , educational and financial support.  336-427-4357 ? [place] DSS Where to apply for food stamps, Medicaid and utility assistance. 336-342-1394 ? [facility]: Transportation to medical appointments. 336-347-2287 ? Social Security Administration: May apply for disability if have a Stage IV cancer. 336-342-7796 1-800-772-1213 ? [place] [facility]ging, Disability and Transit Services: Assists with nutrition, care and transit needs. 336-349-2343         

## 2018-01-27 NOTE — Progress Notes (Signed)
[name] tolerated chemo tx well without complaints or incident. Labs reviewed prior to administering chemotherapy. Pt discharged with 5FU pump infusing without issues. VSS upon discharge. Pt discharged self ambulatory using a cane in satisfactory condition

## 2018-01-28 ENCOUNTER — Telehealth: Payer: Self-pay | Admitting: Family Medicine

## 2018-01-28 NOTE — Telephone Encounter (Signed)
Patient left voicemail requesting a call back to phone # [phone] to discuss upcoming appt. I called her 2x both times received busy tone. I will mail reminders.

## 2018-01-29 ENCOUNTER — Encounter (HOSPITAL_COMMUNITY): Payer: Self-pay

## 2018-01-29 ENCOUNTER — Inpatient Hospital Stay (HOSPITAL_COMMUNITY): Payer: Medicare HMO

## 2018-01-29 VITALS — BP 123/52 | HR 56 | Temp 98.4°F | Resp 16

## 2018-01-29 DIAGNOSIS — C252 Malignant neoplasm of tail of pancreas: Secondary | ICD-10-CM | POA: Diagnosis not present

## 2018-01-29 DIAGNOSIS — C787 Secondary malignant neoplasm of liver and intrahepatic bile duct: Principal | ICD-10-CM

## 2018-01-29 DIAGNOSIS — C259 Malignant neoplasm of pancreas, unspecified: Secondary | ICD-10-CM

## 2018-01-29 DIAGNOSIS — Z5111 Encounter for antineoplastic chemotherapy: Secondary | ICD-10-CM | POA: Diagnosis not present

## 2018-01-29 MED ORDER — HEPARIN SOD (PORK) LOCK FLUSH 100 UNIT/ML IV SOLN
500.0000 [IU] | Freq: Once | INTRAVENOUS | Status: AC | PRN
  Administered 2018-01-29: 500 [IU]

## 2018-01-29 MED ORDER — SODIUM CHLORIDE 0.9% FLUSH
10.0000 mL | INTRAVENOUS | Status: DC | PRN
  Administered 2018-01-29: 10 mL
  Filled 2018-01-29: qty 10

## 2018-01-29 NOTE — Progress Notes (Signed)
Patients chemo pump disconnected with no complaints voiced.  Flushed easily.  Port site clean and dry with no bruising or swelling noted at site.  Band aid applied.  VSS with discharge and left ambulatory with no s/s of distress noted.

## 2018-01-29 NOTE — Patient Instructions (Signed)
[place] at [facility]  Discharge Instructions:  Your chemotherapy pump was disconnected.  Keep all scheduled appointments.  _______________________________________________________________  Thank you for choosing [place] at [facility] to provide your oncology and hematology care.  To afford each patient quality time with our providers, please arrive at least 15 minutes before your scheduled appointment.  You need to re-schedule your appointment if you arrive 10 or more minutes late.  We strive to give you quality time with our providers, and arriving late affects you and other patients whose appointments are after yours.  Also, if you no show three or more times for appointments you may be dismissed from the clinic.  Again, thank you for choosing [place] at [place] hope is that these requests will allow you access to exceptional care and in a timely manner. _______________________________________________________________  If you have questions after your visit, please contact our office at [phone] between the hours of 8:30 a.m. and 5:00 p.m. Voicemails left after 4:30 p.m. will not be returned until the following business day. _______________________________________________________________  For prescription refill requests, have your pharmacy contact our office. _______________________________________________________________  Recommendations made by the consultant and any test results will be sent to your referring physician. _______________________________________________________________

## 2018-01-30 ENCOUNTER — Other Ambulatory Visit (HOSPITAL_COMMUNITY): Payer: Self-pay | Admitting: Hematology

## 2018-01-30 DIAGNOSIS — R3915 Urgency of urination: Secondary | ICD-10-CM

## 2018-02-06 ENCOUNTER — Other Ambulatory Visit (HOSPITAL_COMMUNITY): Payer: Self-pay | Admitting: Hematology

## 2018-02-06 ENCOUNTER — Other Ambulatory Visit: Payer: Self-pay | Admitting: Family Medicine

## 2018-02-06 DIAGNOSIS — R3915 Urgency of urination: Secondary | ICD-10-CM

## 2018-02-10 ENCOUNTER — Encounter (HOSPITAL_COMMUNITY): Payer: Self-pay | Admitting: Hematology

## 2018-02-10 ENCOUNTER — Inpatient Hospital Stay (HOSPITAL_COMMUNITY): Payer: Medicare HMO | Attending: Hematology

## 2018-02-10 ENCOUNTER — Inpatient Hospital Stay (HOSPITAL_BASED_OUTPATIENT_CLINIC_OR_DEPARTMENT_OTHER): Payer: Medicare HMO | Admitting: Hematology

## 2018-02-10 ENCOUNTER — Inpatient Hospital Stay (HOSPITAL_COMMUNITY): Payer: Medicare HMO

## 2018-02-10 ENCOUNTER — Encounter (HOSPITAL_COMMUNITY): Payer: Self-pay

## 2018-02-10 ENCOUNTER — Other Ambulatory Visit: Payer: Self-pay

## 2018-02-10 VITALS — BP 173/57 | HR 58 | Temp 98.8°F | Resp 16 | Wt 123.0 lb

## 2018-02-10 VITALS — BP 147/81 | HR 76 | Temp 97.8°F | Resp 16

## 2018-02-10 DIAGNOSIS — C259 Malignant neoplasm of pancreas, unspecified: Secondary | ICD-10-CM

## 2018-02-10 DIAGNOSIS — Z23 Encounter for immunization: Secondary | ICD-10-CM | POA: Diagnosis not present

## 2018-02-10 DIAGNOSIS — R634 Abnormal weight loss: Secondary | ICD-10-CM | POA: Diagnosis not present

## 2018-02-10 DIAGNOSIS — R197 Diarrhea, unspecified: Secondary | ICD-10-CM

## 2018-02-10 DIAGNOSIS — Z5111 Encounter for antineoplastic chemotherapy: Secondary | ICD-10-CM | POA: Insufficient documentation

## 2018-02-10 DIAGNOSIS — C787 Secondary malignant neoplasm of liver and intrahepatic bile duct: Principal | ICD-10-CM

## 2018-02-10 DIAGNOSIS — F17210 Nicotine dependence, cigarettes, uncomplicated: Secondary | ICD-10-CM

## 2018-02-10 DIAGNOSIS — R69 Illness, unspecified: Secondary | ICD-10-CM | POA: Diagnosis not present

## 2018-02-10 LAB — CBC WITH DIFFERENTIAL/PLATELET
BASOS PCT: 0 %
Basophils Absolute: 0 10*3/uL (ref 0.0–0.1)
EOS ABS: 0.2 10*3/uL (ref 0.0–0.7)
Eosinophils Relative: 4 %
HEMATOCRIT: 28.5 % — AB (ref 36.0–46.0)
HEMOGLOBIN: 9.7 g/dL — AB (ref 12.0–15.0)
LYMPHS ABS: 1.1 10*3/uL (ref 0.7–4.0)
Lymphocytes Relative: 23 %
MCH: 29.8 pg (ref 26.0–34.0)
MCHC: 34 g/dL (ref 30.0–36.0)
MCV: 87.7 fL (ref 78.0–100.0)
Monocytes Absolute: 0.4 10*3/uL (ref 0.1–1.0)
Monocytes Relative: 9 %
NEUTROS ABS: 3 10*3/uL (ref 1.7–7.7)
NEUTROS PCT: 64 %
Platelets: 112 10*3/uL — ABNORMAL LOW (ref 150–400)
RBC: 3.25 MIL/uL — AB (ref 3.87–5.11)
RDW: 16.9 % — ABNORMAL HIGH (ref 11.5–15.5)
WBC: 4.8 10*3/uL (ref 4.0–10.5)

## 2018-02-10 LAB — COMPREHENSIVE METABOLIC PANEL
ALBUMIN: 3.3 g/dL — AB (ref 3.5–5.0)
ALK PHOS: 132 U/L — AB (ref 38–126)
ALT: 9 U/L (ref 0–44)
AST: 17 U/L (ref 15–41)
Anion gap: 8 (ref 5–15)
BILIRUBIN TOTAL: 0.5 mg/dL (ref 0.3–1.2)
BUN: 17 mg/dL (ref 8–23)
CALCIUM: 8.2 mg/dL — AB (ref 8.9–10.3)
CO2: 24 mmol/L (ref 22–32)
CREATININE: 1.33 mg/dL — AB (ref 0.44–1.00)
Chloride: 108 mmol/L (ref 98–111)
GFR calc Af Amer: 40 mL/min — ABNORMAL LOW (ref >60)
GFR calc non Af Amer: 35 mL/min — ABNORMAL LOW (ref >60)
GLUCOSE: 103 mg/dL — AB (ref 70–99)
Potassium: 3.6 mmol/L (ref 3.5–5.1)
SODIUM: 140 mmol/L (ref 135–145)
Total Protein: 6.4 g/dL — ABNORMAL LOW (ref 6.5–8.1)

## 2018-02-10 LAB — LACTATE DEHYDROGENASE: LDH: 133 U/L (ref 98–192)

## 2018-02-10 MED ORDER — SODIUM CHLORIDE 0.9 % IV SOLN
1920.0000 mg/m2 | INTRAVENOUS | Status: DC
  Administered 2018-02-10: 3100 mg via INTRAVENOUS
  Filled 2018-02-10: qty 62

## 2018-02-10 MED ORDER — SODIUM CHLORIDE 0.9% FLUSH
10.0000 mL | INTRAVENOUS | Status: DC | PRN
  Administered 2018-02-10: 10 mL
  Filled 2018-02-10: qty 10

## 2018-02-10 MED ORDER — LEUCOVORIN CALCIUM INJECTION 350 MG
700.0000 mg | Freq: Once | INTRAVENOUS | Status: AC
  Administered 2018-02-10: 700 mg via INTRAVENOUS
  Filled 2018-02-10: qty 35

## 2018-02-10 MED ORDER — SODIUM CHLORIDE 0.9 % IV SOLN
50.0000 mg/m2 | Freq: Once | INTRAVENOUS | Status: AC
  Administered 2018-02-10: 81.7 mg via INTRAVENOUS
  Filled 2018-02-10: qty 19

## 2018-02-10 MED ORDER — INFLUENZA VAC SPLIT HIGH-DOSE 0.5 ML IM SUSY
0.5000 mL | PREFILLED_SYRINGE | INTRAMUSCULAR | Status: AC
  Administered 2018-02-10: 0.5 mL via INTRAMUSCULAR

## 2018-02-10 MED ORDER — INFLUENZA VAC SPLIT HIGH-DOSE 0.5 ML IM SUSY
PREFILLED_SYRINGE | INTRAMUSCULAR | Status: AC
  Filled 2018-02-10: qty 0.5

## 2018-02-10 MED ORDER — SODIUM CHLORIDE 0.9 % IV SOLN
Freq: Once | INTRAVENOUS | Status: AC
  Administered 2018-02-10: 11:00:00 via INTRAVENOUS

## 2018-02-10 MED ORDER — SODIUM CHLORIDE 0.9 % IV SOLN
Freq: Once | INTRAVENOUS | Status: AC
  Administered 2018-02-10: 11:00:00 via INTRAVENOUS
  Filled 2018-02-10: qty 4

## 2018-02-10 NOTE — Patient Instructions (Signed)
[facility] at [facility] Discharge Instructions     Thank you for choosing [facility] at [facility] to provide your oncology and hematology care.  To afford each patient quality time with our provider, please arrive at least 15 minutes before your scheduled appointment time.   If you have a lab appointment with the Cancer Center please come in thru the  Main Entrance and check in at the main information desk  You need to re-schedule your appointment should you arrive 10 or more minutes late.  We strive to give you quality time with our providers, and arriving late affects you and other patients whose appointments are after yours.  Also, if you no show three or more times for appointments you may be dismissed from the clinic at the providers discretion.     Again, thank you for choosing [facility].  Our hope is that these requests will decrease the amount of time that you wait before being seen by our physicians.       _____________________________________________________________  Should you have questions after your visit to [facility], please contact our office at [phone] between the hours of 8:00 a.m. and 4:30 p.m.  Voicemails left after 4:00 p.m. will not be returned until the following business day.  For prescription refill requests, have your pharmacy contact our office and allow 72 hours.    Cancer Center Support Programs:   > Cancer Support Group  2nd Tuesday of the month 1pm-2pm, Journey Room    

## 2018-02-10 NOTE — Assessment & Plan Note (Signed)
1.  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 28 days started on 01/28/2017 -CT scan done on 07/13/2017 shows stable liver disease. CA 19-9 was on 06/24/2017 at 129.  CA-19-9 at last visit went up to 171. - I discussed the latest CT scan reports which showed progression in the liver lesions which are very subtle.  There is progression in the tail of the pancreas lesion measuring 27 x 27 mm.  Even though the progression is very small, her CA 19-9 is continuing to go up, last one at 171.  Hence I have recommended a change in her treatment.  Gemcitabine and Tarceva were discontinued. - First cycle of infusional 5-FU and liposomal Irinotecan started on 10/21/2017, Onivyde started at a decreased dose of 50 mg/m.  The dose of infusional 5-FU was reduced by 20% after cycle 1 secondary to mucositis. - Cycle 6 chemotherapy was on 12/30/2017.  She is tolerating it very well.  We did reviewed the results of CEA which has come down to 206 on 12/30/2017.  This was 316 in June. - CT scan of the CAP on 01/04/2018 cycles without contrast done due to allergy, showed stable mass in the pancreatic tail and less distinctively seen hepatic metastasis secondary to lack of IV contrast with no new metastasis.  This is consistent with treatment response. -Cycle 8 was on 01/27/2018.  She is tolerating chemotherapy very well.  Her CA 19-9 has come down to 172. - She may proceed with her cycle 9 today without any dose modifications.  I will see her back in 4 weeks for follow-up.  2.  Weight loss: She will continue Marinol 2.5 mg twice daily.  Weight has been stable for the past few months.    3.  Genetic testing: -We have made a consultation with geneticist for BRCA1/2 testing.  We will also consider  MSI testing.

## 2018-02-10 NOTE — Progress Notes (Signed)
Patient tolerated chemotherapy with no complaints voiced.  Port site clean and dry with no bruising or swelling noted at site.  Dressing intact with start of chemo infusion pump and no alarms noted.  VSS with discharge and left ambulatory with no s/s of distress noted.

## 2018-02-10 NOTE — Patient Instructions (Signed)
[place] [place] Discharge Instructions for Patients Receiving Chemotherapy  Today you received the following chemotherapy agents irinotecan, leucovorin, and adruicil.    If you develop nausea and vomiting that is not controlled by your nausea medication, call the clinic.   BELOW ARE SYMPTOMS THAT SHOULD BE REPORTED IMMEDIATELY:  *FEVER GREATER THAN 100.5 F  *CHILLS WITH OR WITHOUT FEVER  NAUSEA AND VOMITING THAT IS NOT CONTROLLED WITH YOUR NAUSEA MEDICATION  *UNUSUAL SHORTNESS OF BREATH  *UNUSUAL BRUISING OR BLEEDING  TENDERNESS IN MOUTH AND THROAT WITH OR WITHOUT PRESENCE OF ULCERS  *URINARY PROBLEMS  *BOWEL PROBLEMS  UNUSUAL RASH Items with * indicate a potential emergency and should be followed up as soon as possible.  Feel free to call the clinic should you have any questions or concerns. The clinic phone number is [phone].  Please show the [place] at check-in to the Emergency Department and triage nurse.

## 2018-02-10 NOTE — Progress Notes (Signed)
[place], [place] [address]   CLINIC:  Medical Oncology/Hematology  PCP:  Fayrene Helper, MD [address], [place] [address] [phone]   REASON FOR VISIT: Follow-up for pancreatic cancer to the liver  CURRENT THERAPY: Infusional 5FU and liposomal Irinotecan   BRIEF ONCOLOGIC HISTORY:    Pancreatic cancer metastasized to liver ([facility])   03/10/2014 Initial Diagnosis    Pancreatic cancer metastasized to liver    03/22/2014 - 03/05/2016 Chemotherapy    Abraxane/Gemzar days 1, 8, every 28 days.  Day 15 was cancelled due to leukopenia and thrombocytopenia on day 15 cycle 1.    05/24/2014 Treatment Plan Change    Day 8 of cycle 3 is held with ANC of 1.1    06/05/2014 Imaging    CT C/A/P . Interval decrease in size of the pancreatic tail mass.Improved hepatic metastatic disease. No new lesions. No CT findings for metastatic disease involving the chest.    08/09/2014 Tumor Marker    CA 19-9= 33 (WNL)    10/26/2014 Imaging    MRI- Continued interval decrease in size of the hepatic metastatic lesions and no new lesions are identified. Continued decrease in size of the pancreatic tail lesion.    01/03/2015 Tumor Marker    Results for YVANNA, VIDAS (MRN [id number]) as of 01/18/2015 12:52  01/03/2015 10:00 CA 19-9: 14     01/17/2015 Imaging    MRI- Response to therapy of hepatic metastasis.  Similar size of a pancreatic tail lesion.    03/20/2015 Imaging    MRI-L spine- Severe disc space narrowing at L2-L3, with endplate reactive changes. Large disc extrusion into the ventral epidural space,central to the RIGHT with a cephalad migrated free fragment. Additional Large disc extrusion into the retroperitone...    03/22/2015 Imaging    CT pelvis- No evidence for metastatic disease within the pelvis.    07/24/2015 Imaging    MRI abd- Continued response to therapy, with no residual detectable liver metastases. No new sites of  metastatic disease in the abdomen.     08/15/2015 Code Status     She confirms desire for DNR status.    12/21/2015 Imaging    MRI liver- Severe image degradation due to motion artifact reducing diagnostic sensitivity and specificity. Reduce conspicuity of the pancreatic tail lesion suggesting further improvement. The original liver lesions have resolved.    03/05/2016 Treatment Plan Change    Chemotherapy holiday/Break after 2 years worth of treatment    04/07/2016 Imaging    CT chest- When compared to recent chest CT, new minimally displaced anterior left sixth rib fracture. Slight increase in subcarinal adenopathy    04/28/2016 Imaging    Bone density- BMD as determined from Femur Neck Right is 0.705 g/cm2 with a T-Score of -2.4. This patient is considered osteopenic according to [facility]) criteria. Compared with the prior study on 02/23/2013, the BMD of the lumbar spine/rt. femoral neck show a statistically significant decrease.     05/23/2016 Imaging    MRI abd- 1. Exam is significantly degraded by patient respiratory motion. Consider follow-up exams with CT abdomen with without contrast per pancreatic protocol. 2. Fullness in tail of pancreas is more prominent and potentially increased in size. Recommend close attention on follow-up. (Consider CT as above) 3. No explanation for back pain.    09/11/2016 Imaging    MRI pancreas- Interval progression of pancreatic tail lesion. New differential perfusion right liver with  areas of heterogeneity. Underlying metastatic disease in this region not excluded.    09/11/2016 Progression    MRi in conjunction with rising CA 19-9 are indicative of relapse of disease.    01/09/2017 Progression    CT C/A/P: 2.1 x 2.9 cm lesion in the pancreatic tail and abutting the splenic hilum, corresponding to known primary pancreatic neoplasm, mildly increased.  Scattered small hypoenhancing lesions in the liver measuring up  to 10 mm, approximately 8-10 in number, suspicious for hepatic metastases.  No findings specific for metastatic disease in the chest.  Additional ancillary findings as above.     01/27/2017 - 10/20/2017 Chemotherapy    The patient had gemcitabine (GEMZAR) 1,292 mg in sodium chloride 0.9 % 250 mL chemo infusion, 800 mg/m2 = 1,292 mg, Intravenous,  Once, 9 of 12 cycles Administration: 1,292 mg (01/28/2017), 1,292 mg (02/11/2017), 1,292 mg (02/25/2017), 1,292 mg (03/11/2017), 1,292 mg (03/25/2017), 1,292 mg (04/08/2017), 1,292 mg (04/22/2017), 1,292 mg (04/29/2017), 1,292 mg (05/20/2017), 1,292 mg (06/03/2017), 1,292 mg (06/24/2017), 1,292 mg (07/08/2017), 1,292 mg (07/29/2017), 1,292 mg (08/12/2017), 1,292 mg (08/26/2017), 1,292 mg (09/09/2017), 1,292 mg (09/23/2017), 1,292 mg (10/07/2017)  for chemotherapy treatment.     10/20/2017 -  Chemotherapy    The patient had leucovorin 700 mg in dextrose 5 % 250 mL infusion, 644 mg, Intravenous,  Once, 9 of 10 cycles Administration: 700 mg (10/21/2017), 700 mg (11/04/2017), 700 mg (11/18/2017), 700 mg (12/02/2017), 700 mg (12/16/2017), 700 mg (12/30/2017), 700 mg (01/13/2018), 700 mg (01/27/2018), 700 mg (02/10/2018) ondansetron (ZOFRAN) 8 mg in sodium chloride 0.9 % 50 mL IVPB, , Intravenous,  Once, 8 of 9 cycles Administration:  (11/04/2017),  (01/27/2018),  (02/10/2018) fluorouracil (ADRUCIL) 3,850 mg in sodium chloride 0.9 % 73 mL chemo infusion, 2,400 mg/m2 = 3,850 mg, Intravenous, 1 Day/Dose, 9 of 10 cycles Dose modification: 1,920 mg/m2 (original dose 2,400 mg/m2, Cycle 2, Reason: Provider Judgment, Comment: mucisitis) Administration: 3,850 mg (10/21/2017), 3,100 mg (11/04/2017), 3,100 mg (11/18/2017), 3,100 mg (12/02/2017), 3,100 mg (12/16/2017), 3,100 mg (12/30/2017), 3,100 mg (01/13/2018), 3,100 mg (01/27/2018), 3,100 mg (02/10/2018) irinotecan LIPOSOME (ONIVYDE) 81.7 mg in sodium chloride 0.9 % 500 mL chemo infusion, 50 mg/m2 = 81.7 mg (100 % of original dose 50 mg/m2), Intravenous,  Once, 9 of 10 cycles Dose modification: 50 mg/m2 (original dose 50 mg/m2, Cycle 1, Reason: Provider Judgment), 50 mg/m2 (original dose 50 mg/m2, Cycle 2, Reason: Provider Judgment) Administration: 81.7 mg (10/21/2017), 81.7 mg (11/04/2017), 81.7 mg (11/18/2017), 81.7 mg (12/02/2017), 81.7 mg (12/16/2017), 81.7 mg (12/30/2017), 81.7 mg (01/13/2018), 81.7 mg (01/27/2018), 81.7 mg (02/10/2018)  for chemotherapy treatment.       CANCER STAGING: Cancer Staging Pancreatic cancer metastasized to liver [facility]) Staging form: Pancreas, AJCC 7th Edition - Clinical: Stage IV (T3, N1, M1) - Signed by Baird Cancer, PA-C on 03/16/2014 - Pathologic: No stage assigned - Unsigned    INTERVAL HISTORY:  Ms. Grivas 84 y.o. female returns for routine follow-up for metastatic pancreatic cancer. Patient is here day and doing well with treatment. She has diarrhea 2-3 times a week. Her energy is improving since we cut back on the treatment dose. She has numbness in her feet however this is stable at this time. She is complaining of a knot at the base of her neck on her clavical bone. It is not painful or mobile. She just noticed it a week ago. Her right leg tightens up at night she describes it at as a rubber band on it. It comes and goes  and is never constant. It happens mostly at night. Her appetite and energy level is 50%. She is maintaining her weight at this time. She denies any new pains. Denies any nausea or vomiting. Denies any headaches or visions changes.   REVIEW OF SYSTEMS:  Review of Systems  Gastrointestinal: Positive for diarrhea.  Neurological: Positive for numbness.  All other systems reviewed and are negative.    PAST MEDICAL/SURGICAL HISTORY:  Past Medical History:  Diagnosis Date  . Anemia due to antineoplastic chemotherapy 09/12/2015   Started Aranesp 500 mcg on 09/12/2015  . Anxiety   . Chronic diarrhea   . Depression   . DNR (do not resuscitate) 08/16/2015  . Erosive esophagitis   . GERD  (gastroesophageal reflux disease)   . Hx of adenomatous colonic polyps    tubular adenomas, last found in 2008  . Hyperplastic colon polyp 03/19/10   tcs by Dr. Gala Romney  . Hypertension 20 years   . Kidney stone    hx/ crushed   . Opioid contract exists 04/18/2015   With Dr. Moshe Cipro  . Pancreatic cancer ([facility]) 02/2014  . Pancreatic cancer metastasized to liver ([facility]) 03/10/2014  . Schatzki's ring 08/26/10   Last dilated on EGD by Dr. Trevor Iha [facility], linear gastric erosions, BI hemigastrectomy   Past Surgical History:  Procedure Laterality Date  . BREAST LUMPECTOMY Left   . bunion removal     from both feet   . CHOLECYSTECTOMY  1965   . COLONOSCOPY  03/19/2010   DR Gala Romney,, normal TI, pancolonic diverticula, random colon bx neg., hyperplastic polyps removed  . ESOPHAGOGASTRODUODENOSCOPY  11/22/2003   DR Gala Romney, erosive RE, Billroth I  . ESOPHAGOGASTRODUODENOSCOPY  08/26/10   Dr. Gala Romney- moderate severe ERE, Scahtzki ring s/p dilation, Billroth I, linear gastric erosions, bx-gastric xanthelasma  . LEFT SHOULDER SURGERY  2009   DR HARRISON  . ORIF ANKLE FRACTURE Right 05/22/2013   Procedure: OPEN REDUCTION INTERNAL FIXATION (ORIF) RIGHT ANKLE FRACTURE;  Surgeon: Sanjuana Kava, MD;  Location: [facility];  Service: Orthopedics;  Laterality: Right;  . stomach ulcer  50 years ago    had some of her stomach removed      SOCIAL HISTORY:  Social History   Socioeconomic History  . Marital status: Widowed    Spouse name: Not on file  . Number of children: 2  . Years of education: Not on file  . Highest education level: Not on file  Occupational History  . Occupation: retired from Estate manager/land agent: RETIRED  Social Needs  . Financial resource strain: Not on file  . Food insecurity:    Worry: Not on file    Inability: Not on file  . Transportation needs:    Medical: Not on file    Non-medical: Not on file  Tobacco Use  . Smoking status: Current Some Day Smoker    Packs/day: 0.50     Years: 60.00    Pack years: 30.00    Types: Cigarettes    Last attempt to quit: 09/09/2016    Years since quitting: 1.4  . Smokeless tobacco: Never Used  Substance and Sexual Activity  . Alcohol use: No  . Drug use: No  . Sexual activity: Never  Lifestyle  . Physical activity:    Days per week: Not on file    Minutes per session: Not on file  . Stress: Not on file  Relationships  . Social connections:    Talks on phone: Not on file  Gets together: Not on file    Attends religious service: Not on file    Active member of club or organization: Not on file    Attends meetings of clubs or organizations: Not on file    Relationship status: Not on file  . Intimate partner violence:    Fear of current or ex partner: Not on file    Emotionally abused: Not on file    Physically abused: Not on file    Forced sexual activity: Not on file  Other Topics Concern  . Not on file  Social History Narrative  . Not on file    FAMILY HISTORY:  Family History  Problem Relation Age of Onset  . Hypertension Mother   . Kidney failure Brother        on dialysis  . Diabetes Sister   . Diabetes Sister   . Hypertension Father   . Kidney failure Sister   . Kidney Stones Brother   . Breast cancer Son   . Gout Son   . Liver disease Neg Hx   . Colon cancer Neg Hx     CURRENT MEDICATIONS:  Outpatient Encounter Medications as of 02/10/2018  Medication Sig Note  . acetaminophen (TYLENOL) 500 MG tablet Take 500 mg by mouth every 6 (six) hours as needed for mild pain or moderate pain.    Marland Kitchen amLODipine (NORVASC) 10 MG tablet TAKE 1 TABLET BY MOUTH EVERY DAY   . clonazePAM (KLONOPIN) 0.5 MG tablet TAKE 1 TABLET BY MOUTH TABLET EVERY DAY AS NEEDED FOR ANXIETY   . cycloSPORINE (RESTASIS) 0.05 % ophthalmic emulsion Place 1 drop into both eyes 2 (two) times daily.   . diphenhydrAMINE (BENADRYL) 50 MG tablet Take 1 tablet 1 hour prior to CT   . dronabinol (MARINOL) 2.5 MG capsule Take 2.5 mg by mouth  daily.   Marland Kitchen lidocaine (XYLOCAINE) 2 % solution RINSE WITH 5ML AS NEEDED TO RELIEVE MOUTH PAIN.   . metoprolol tartrate (LOPRESSOR) 25 MG tablet Take 1 tablet by mouth 2 (two) times daily. 12/02/2017: Taking once a day   . metoprolol tartrate (LOPRESSOR) 25 MG tablet TAKE 1 TABLET BY MOUTH TWICE DAILY   . mirabegron ER (MYRBETRIQ) 25 MG TB24 tablet Take 1 tablet (25 mg total) by mouth daily.   . mirtazapine (REMERON) 7.5 MG tablet Take 1 tablet (7.5 mg total) by mouth at bedtime.   . nicotine (NICODERM CQ - DOSED IN MG/24 HR) 7 mg/24hr patch Place 1 patch (7 mg total) onto the skin daily.   Marland Kitchen olmesartan (BENICAR) 20 MG tablet Take 1 tablet (20 mg total) by mouth daily.   . pantoprazole (PROTONIX) 20 MG tablet TAKE 1 TABLET(20 MG) BY MOUTH DAILY   . potassium chloride (K-DUR) 10 MEQ tablet Take 1 tablet (10 mEq total) by mouth 2 (two) times daily.   . predniSONE (DELTASONE) 50 MG tablet Take one tab at 11pm, one tab at 5am and then one tab at 11am   . traMADol (ULTRAM) 50 MG tablet One tablet once daily, as needed, for uncontrolled pain   . [DISCONTINUED] prochlorperazine (COMPAZINE) 10 MG tablet Take 1 tablet (10 mg total) by mouth every 6 (six) hours as needed (Nausea or vomiting).    Facility-Administered Encounter Medications as of 02/10/2018  Medication  . dexamethasone (DECADRON) 10 mg in sodium chloride 0.9 % 50 mL IVPB    ALLERGIES:  Allergies  Allergen Reactions  . Iohexol Hives and Shortness Of Breath    **Patient can  not have IV contrast, pt has breakthrough reaction even after 13 hour premeds** Do not give IV contrast PATIENT HAD TO BE TAKEN TO ED, C/O WHELPS, HIVES, DIFFICULTY BREATHING. PATIENT GIVEN IV CONTRAST AFTER 13 HOUR PRE MEDS ON 01/09/2017 WITH NO REACTION NOTED. On 10/19/17 patient had reaction despite premedication.    Marland Kitchen Aciphex [Rabeprazole Sodium] Other (See Comments)    unknown  . Amlodipine Besylate-Valsartan     Rash to high-dose (5/320)  . Esomeprazole Magnesium    . Omeprazole Other (See Comments)    Patient states "medication didn't work"  . Ciprofloxacin Rash  . Penicillins Swelling and Rash    Has patient had a PCN reaction causing immediate rash, facial/tongue/throat swelling, SOB or lightheadedness with hypotension: Yes Has patient had a PCN reaction causing severe rash involving mucus membranes or skin necrosis: Yes Has patient had a PCN reaction that required hospitalization: No Has patient had a PCN reaction occurring within the last 10 years: yes If all of the above answers are "NO", then may proceed with Cephalosporin use.      PHYSICAL EXAM:  ECOG Performance status: 1  Vitals:   02/10/18 0944  BP: (!) 173/57  Pulse: (!) 58  Resp: 16  Temp: 98.8 F (37.1 C)  SpO2: 100%   Filed Weights   02/10/18 0944  Weight: 123 lb (55.8 kg)    Physical Exam  Constitutional: She is oriented to person, place, and time. She appears well-developed and well-nourished.  Neck:  Right sided enlarged knot base of neck  Cardiovascular: Normal rate, regular rhythm and normal heart sounds.  Pulmonary/Chest: Effort normal and breath sounds normal.  Musculoskeletal: Normal range of motion.  Neurological: She is alert and oriented to person, place, and time.  Skin: Skin is warm and dry.  Psychiatric: She has a normal mood and affect. Her behavior is normal. Judgment and thought content normal.      LABORATORY DATA:  I have reviewed the labs as listed.  CBC    Component Value Date/Time   WBC 4.8 02/10/2018 0929   RBC 3.25 (L) 02/10/2018 0929   HGB 9.7 (L) 02/10/2018 0929   HCT 28.5 (L) 02/10/2018 0929   PLT 112 (L) 02/10/2018 0929   MCV 87.7 02/10/2018 0929   MCH 29.8 02/10/2018 0929   MCHC 34.0 02/10/2018 0929   RDW 16.9 (H) 02/10/2018 0929   LYMPHSABS 1.1 02/10/2018 0929   MONOABS 0.4 02/10/2018 0929   EOSABS 0.2 02/10/2018 0929   BASOSABS 0.0 02/10/2018 0929   CMP Latest Ref Rng & Units 02/10/2018 01/27/2018 01/13/2018  Glucose 70 -  99 mg/dL 103(H) 108(H) 102(H)  BUN 8 - 23 mg/dL 17 21 24(H)  Creatinine 0.44 - 1.00 mg/dL 1.33(H) 1.46(H) 1.26(H)  Sodium 135 - 145 mmol/L 140 140 143  Potassium 3.5 - 5.1 mmol/L 3.6 4.0 4.1  Chloride 98 - 111 mmol/L 108 106 108  CO2 22 - 32 mmol/L '24 27 29  '$ Calcium 8.9 - 10.3 mg/dL 8.2(L) 8.6(L) 8.4(L)  Total Protein 6.5 - 8.1 g/dL 6.4(L) 6.8 6.6  Total Bilirubin 0.3 - 1.2 mg/dL 0.5 0.6 0.9  Alkaline Phos 38 - 126 U/L 132(H) 127(H) 127(H)  AST 15 - 41 U/L '17 17 15  '$ ALT 0 - 44 U/L '9 10 12          '$ ASSESSMENT & PLAN:   Pancreatic cancer metastasized to liver ([facility]) 1.  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 28 days started on 01/28/2017 -CT scan done on 07/13/2017 shows stable liver disease. CA 19-9 was on 06/24/2017 at 129.  CA-19-9 at last visit went up to 171. - I discussed the latest CT scan reports which showed progression in the liver lesions which are very subtle.  There is progression in the tail of the pancreas lesion measuring 27 x 27 mm.  Even though the progression is very small, her CA 19-9 is continuing to go up, last one at 171.  Hence I have recommended a change in her treatment.  Gemcitabine and Tarceva were discontinued. - First cycle of infusional 5-FU and liposomal Irinotecan started on 10/21/2017, Onivyde started at a decreased dose of 50 mg/m.  The dose of infusional 5-FU was reduced by 20% after cycle 1 secondary to mucositis. - Cycle 6 chemotherapy was on 12/30/2017.  She is tolerating it very well.  We did reviewed the results of CEA which has come down to 206 on 12/30/2017.  This was 316 in June. - CT scan of the CAP on 01/04/2018 cycles without contrast done due to allergy, showed stable mass in the pancreatic tail and less  distinctively seen hepatic metastasis secondary to lack of IV contrast with no new metastasis.  This is consistent with treatment response. -Cycle 8 was on 01/27/2018.  She is tolerating chemotherapy very well.  Her CA 19-9 has come down to 172. - She may proceed with her cycle 9 today without any dose modifications.  I will see her back in 4 weeks for follow-up.  2.  Weight loss: She will continue Marinol 2.5 mg twice daily.  Weight has been stable for the past few months.    3.  Genetic testing: -We have made a consultation with geneticist for BRCA1/2 testing.  We will also consider MSI testing.      Orders placed this encounter:  Orders Placed This Encounter  Procedures  . Lactate dehydrogenase  . CBC with Differential/Platelet  . Comprehensive metabolic panel  . Cancer antigen 19-9      Derek Jack, MD [place] [phone]

## 2018-02-11 LAB — CANCER ANTIGEN 19-9: CA 19-9: 113 U/mL — ABNORMAL HIGH (ref 0–35)

## 2018-02-12 ENCOUNTER — Encounter (HOSPITAL_COMMUNITY): Payer: Self-pay

## 2018-02-12 ENCOUNTER — Inpatient Hospital Stay (HOSPITAL_COMMUNITY): Payer: Medicare HMO

## 2018-02-12 VITALS — BP 129/50 | HR 57 | Temp 97.3°F | Resp 18

## 2018-02-12 DIAGNOSIS — C259 Malignant neoplasm of pancreas, unspecified: Secondary | ICD-10-CM

## 2018-02-12 DIAGNOSIS — Z23 Encounter for immunization: Secondary | ICD-10-CM | POA: Diagnosis not present

## 2018-02-12 DIAGNOSIS — C787 Secondary malignant neoplasm of liver and intrahepatic bile duct: Secondary | ICD-10-CM | POA: Diagnosis not present

## 2018-02-12 DIAGNOSIS — Z5111 Encounter for antineoplastic chemotherapy: Secondary | ICD-10-CM | POA: Diagnosis not present

## 2018-02-12 MED ORDER — HEPARIN SOD (PORK) LOCK FLUSH 100 UNIT/ML IV SOLN
500.0000 [IU] | Freq: Once | INTRAVENOUS | Status: AC | PRN
  Administered 2018-02-12: 500 [IU]

## 2018-02-12 MED ORDER — ALTEPLASE 2 MG IJ SOLR: 2.0000 mg | Freq: Once | INTRAMUSCULAR | Status: DC | PRN

## 2018-02-12 MED ORDER — SODIUM CHLORIDE 0.9% FLUSH
10.0000 mL | INTRAVENOUS | Status: DC | PRN
  Administered 2018-02-12: 10 mL
  Filled 2018-02-12: qty 10

## 2018-02-12 NOTE — Progress Notes (Signed)
[name] tolerated 5FU pump well without complaints or incident. 5FU pump discontinued with port flushed with 10 ml NS and 5 ml Heparin easily per protocol then de-accessed. VSS pt discharged self ambulatory using cane in satisfactory condition

## 2018-02-12 NOTE — Patient Instructions (Signed)
[facility] at [facility] Discharge Instructions  5FU pump discontinued with port flushed per protocol. Follow-up as scheduled. Call clinic for any questions or concerns   Thank you for choosing [facility] at [facility] to provide your oncology and hematology care.  To afford each patient quality time with our provider, please arrive at least 15 minutes before your scheduled appointment time.   If you have a lab appointment with the Cancer Center please come in thru the  Main Entrance and check in at the main information desk  You need to re-schedule your appointment should you arrive 10 or more minutes late.  We strive to give you quality time with our providers, and arriving late affects you and other patients whose appointments are after yours.  Also, if you no show three or more times for appointments you may be dismissed from the clinic at the providers discretion.     Again, thank you for choosing [facility].  Our hope is that these requests will decrease the amount of time that you wait before being seen by our physicians.       _____________________________________________________________  Should you have questions after your visit to [facility], please contact our office at [phone] between the hours of 8:00 a.m. and 4:30 p.m.  Voicemails left after 4:00 p.m. will not be returned until the following business day.  For prescription refill requests, have your pharmacy contact our office and allow 72 hours.    Cancer Center Support Programs:   > Cancer Support Group  2nd Tuesday of the month 1pm-2pm, Journey Room   

## 2018-02-18 ENCOUNTER — Encounter (HOSPITAL_COMMUNITY): Payer: Medicare HMO | Admitting: Genetic Counselor

## 2018-02-18 ENCOUNTER — Other Ambulatory Visit (HOSPITAL_COMMUNITY): Payer: Medicare HMO

## 2018-02-20 DIAGNOSIS — C787 Secondary malignant neoplasm of liver and intrahepatic bile duct: Secondary | ICD-10-CM | POA: Diagnosis not present

## 2018-02-20 DIAGNOSIS — C259 Malignant neoplasm of pancreas, unspecified: Secondary | ICD-10-CM | POA: Diagnosis not present

## 2018-02-24 ENCOUNTER — Inpatient Hospital Stay (HOSPITAL_COMMUNITY): Payer: Medicare HMO

## 2018-02-24 ENCOUNTER — Other Ambulatory Visit (HOSPITAL_COMMUNITY): Payer: Self-pay | Admitting: Hematology

## 2018-02-24 ENCOUNTER — Encounter (HOSPITAL_COMMUNITY): Payer: Self-pay

## 2018-02-24 VITALS — BP 154/52 | HR 55 | Temp 97.8°F | Resp 18 | Wt 125.4 lb

## 2018-02-24 DIAGNOSIS — C259 Malignant neoplasm of pancreas, unspecified: Secondary | ICD-10-CM

## 2018-02-24 DIAGNOSIS — C787 Secondary malignant neoplasm of liver and intrahepatic bile duct: Principal | ICD-10-CM

## 2018-02-24 DIAGNOSIS — Z5111 Encounter for antineoplastic chemotherapy: Secondary | ICD-10-CM | POA: Diagnosis not present

## 2018-02-24 DIAGNOSIS — Z23 Encounter for immunization: Secondary | ICD-10-CM | POA: Diagnosis not present

## 2018-02-24 LAB — CBC WITH DIFFERENTIAL/PLATELET
ABS IMMATURE GRANULOCYTES: 0.01 10*3/uL (ref 0.00–0.07)
Basophils Absolute: 0 10*3/uL (ref 0.0–0.1)
Basophils Relative: 0 %
EOS PCT: 5 %
Eosinophils Absolute: 0.3 10*3/uL (ref 0.0–0.5)
HEMATOCRIT: 30.7 % — AB (ref 36.0–46.0)
HEMOGLOBIN: 9.8 g/dL — AB (ref 12.0–15.0)
Immature Granulocytes: 0 %
LYMPHS PCT: 21 %
Lymphs Abs: 1 10*3/uL (ref 0.7–4.0)
MCH: 29.4 pg (ref 26.0–34.0)
MCHC: 31.9 g/dL (ref 30.0–36.0)
MCV: 92.2 fL (ref 80.0–100.0)
MONO ABS: 0.5 10*3/uL (ref 0.1–1.0)
Monocytes Relative: 10 %
NEUTROS ABS: 3.1 10*3/uL (ref 1.7–7.7)
Neutrophils Relative %: 64 %
Platelets: 133 10*3/uL — ABNORMAL LOW (ref 150–400)
RBC: 3.33 MIL/uL — ABNORMAL LOW (ref 3.87–5.11)
RDW: 17.3 % — ABNORMAL HIGH (ref 11.5–15.5)
WBC: 4.9 10*3/uL (ref 4.0–10.5)
nRBC: 0 % (ref 0.0–0.2)

## 2018-02-24 LAB — COMPREHENSIVE METABOLIC PANEL
ALK PHOS: 142 U/L — AB (ref 38–126)
ALT: 10 U/L (ref 0–44)
ANION GAP: 7 (ref 5–15)
AST: 14 U/L — ABNORMAL LOW (ref 15–41)
Albumin: 3.5 g/dL (ref 3.5–5.0)
BUN: 20 mg/dL (ref 8–23)
CALCIUM: 8.5 mg/dL — AB (ref 8.9–10.3)
CO2: 26 mmol/L (ref 22–32)
CREATININE: 1.38 mg/dL — AB (ref 0.44–1.00)
Chloride: 107 mmol/L (ref 98–111)
GFR, EST AFRICAN AMERICAN: 39 mL/min — AB (ref >60)
GFR, EST NON AFRICAN AMERICAN: 33 mL/min — AB (ref >60)
Glucose, Bld: 126 mg/dL — ABNORMAL HIGH (ref 70–99)
Potassium: 3.9 mmol/L (ref 3.5–5.1)
Sodium: 140 mmol/L (ref 135–145)
TOTAL PROTEIN: 7 g/dL (ref 6.5–8.1)
Total Bilirubin: 0.6 mg/dL (ref 0.3–1.2)

## 2018-02-24 MED ORDER — SODIUM CHLORIDE 0.9 % IV SOLN
Freq: Once | INTRAVENOUS | Status: AC
  Administered 2018-02-24: 09:00:00 via INTRAVENOUS

## 2018-02-24 MED ORDER — SODIUM CHLORIDE 0.9 % IV SOLN
Freq: Once | INTRAVENOUS | Status: AC
  Administered 2018-02-24: 10:00:00 via INTRAVENOUS
  Filled 2018-02-24: qty 4

## 2018-02-24 MED ORDER — SODIUM CHLORIDE 0.9% FLUSH
10.0000 mL | INTRAVENOUS | Status: DC | PRN
  Administered 2018-02-24: 10 mL
  Filled 2018-02-24: qty 10

## 2018-02-24 MED ORDER — LEUCOVORIN CALCIUM INJECTION 350 MG
700.0000 mg | Freq: Once | INTRAVENOUS | Status: AC
  Administered 2018-02-24: 700 mg via INTRAVENOUS
  Filled 2018-02-24: qty 35

## 2018-02-24 MED ORDER — SODIUM CHLORIDE 0.9 % IV SOLN
1920.0000 mg/m2 | INTRAVENOUS | Status: DC
  Administered 2018-02-24: 3100 mg via INTRAVENOUS
  Filled 2018-02-24: qty 62

## 2018-02-24 MED ORDER — SODIUM CHLORIDE 0.9 % IV SOLN
50.0000 mg/m2 | Freq: Once | INTRAVENOUS | Status: AC
  Administered 2018-02-24: 81.7 mg via INTRAVENOUS
  Filled 2018-02-24: qty 19

## 2018-02-24 NOTE — Progress Notes (Signed)
Labs reviewed by Dr. Walden Field and ok to treat today.   Patient tolerated chemotherapy with no complaints voiced.  Port site clean and dry with no bruising or swelling noted at site.  Chemotherapy pump connected with no alarms noted.  VSS with discharge and left ambulatory with family with no s/s of distress noted.

## 2018-02-24 NOTE — Patient Instructions (Signed)
[facility] Discharge Instructions for Patients Receiving Chemotherapy  Today you received the following chemotherapy agents irinotecan and leucovorin and adruicil.    If you develop nausea and vomiting that is not controlled by your nausea medication, call the clinic.   BELOW ARE SYMPTOMS THAT SHOULD BE REPORTED IMMEDIATELY:  *FEVER GREATER THAN 100.5 F  *CHILLS WITH OR WITHOUT FEVER  NAUSEA AND VOMITING THAT IS NOT CONTROLLED WITH YOUR NAUSEA MEDICATION  *UNUSUAL SHORTNESS OF BREATH  *UNUSUAL BRUISING OR BLEEDING  TENDERNESS IN MOUTH AND THROAT WITH OR WITHOUT PRESENCE OF ULCERS  *URINARY PROBLEMS  *BOWEL PROBLEMS  UNUSUAL RASH Items with * indicate a potential emergency and should be followed up as soon as possible.  Feel free to call the clinic should you have any questions or concerns. The clinic phone number is [phone].  Please show the [facility] at check-in to the Emergency Department and triage nurse.

## 2018-02-26 ENCOUNTER — Encounter (HOSPITAL_COMMUNITY): Payer: Self-pay

## 2018-02-26 ENCOUNTER — Inpatient Hospital Stay (HOSPITAL_COMMUNITY): Payer: Medicare HMO

## 2018-02-26 ENCOUNTER — Other Ambulatory Visit: Payer: Self-pay

## 2018-02-26 VITALS — BP 137/46 | HR 49 | Resp 16

## 2018-02-26 DIAGNOSIS — C259 Malignant neoplasm of pancreas, unspecified: Secondary | ICD-10-CM | POA: Diagnosis not present

## 2018-02-26 DIAGNOSIS — C787 Secondary malignant neoplasm of liver and intrahepatic bile duct: Principal | ICD-10-CM

## 2018-02-26 DIAGNOSIS — Z23 Encounter for immunization: Secondary | ICD-10-CM | POA: Diagnosis not present

## 2018-02-26 DIAGNOSIS — Z5111 Encounter for antineoplastic chemotherapy: Secondary | ICD-10-CM | POA: Diagnosis not present

## 2018-02-26 MED ORDER — HEPARIN SOD (PORK) LOCK FLUSH 100 UNIT/ML IV SOLN
500.0000 [IU] | Freq: Once | INTRAVENOUS | Status: AC | PRN
  Administered 2018-02-26: 500 [IU]

## 2018-02-26 MED ORDER — SODIUM CHLORIDE 0.9% FLUSH
10.0000 mL | INTRAVENOUS | Status: DC | PRN
  Administered 2018-02-26: 10 mL
  Filled 2018-02-26: qty 10

## 2018-02-26 NOTE — Patient Instructions (Signed)
[facility] at [facility] Discharge Instructions     Thank you for choosing [facility] at [facility] to provide your oncology and hematology care.  To afford each patient quality time with our provider, please arrive at least 15 minutes before your scheduled appointment time.   If you have a lab appointment with the Cancer Center please come in thru the  Main Entrance and check in at the main information desk  You need to re-schedule your appointment should you arrive 10 or more minutes late.  We strive to give you quality time with our providers, and arriving late affects you and other patients whose appointments are after yours.  Also, if you no show three or more times for appointments you may be dismissed from the clinic at the providers discretion.     Again, thank you for choosing [facility].  Our hope is that these requests will decrease the amount of time that you wait before being seen by our physicians.       _____________________________________________________________  Should you have questions after your visit to [facility], please contact our office at [phone] between the hours of 8:00 a.m. and 4:30 p.m.  Voicemails left after 4:00 p.m. will not be returned until the following business day.  For prescription refill requests, have your pharmacy contact our office and allow 72 hours.    Cancer Center Support Programs:   > Cancer Support Group  2nd Tuesday of the month 1pm-2pm, Journey Room    

## 2018-02-26 NOTE — Progress Notes (Signed)
[name] returns today for port de access and flush after 46 hr continous infusion of 5fu. Tolerated infusion without problems. Portacath located right chest wall was  deaccessed and flushed with 20ml NS and 500U/5ml Heparin and needle removed intact.  Procedure without incident. Patient tolerated procedure well.   

## 2018-03-03 ENCOUNTER — Other Ambulatory Visit (HOSPITAL_COMMUNITY): Payer: Self-pay | Admitting: *Deleted

## 2018-03-03 ENCOUNTER — Other Ambulatory Visit: Payer: Self-pay | Admitting: Family Medicine

## 2018-03-03 DIAGNOSIS — R3915 Urgency of urination: Secondary | ICD-10-CM

## 2018-03-03 MED ORDER — MIRABEGRON ER 25 MG PO TB24: 25.0000 mg | ORAL_TABLET | Freq: Every day | ORAL | 2 refills | Status: DC

## 2018-03-10 ENCOUNTER — Inpatient Hospital Stay (HOSPITAL_BASED_OUTPATIENT_CLINIC_OR_DEPARTMENT_OTHER): Payer: Medicare HMO | Admitting: Hematology

## 2018-03-10 ENCOUNTER — Other Ambulatory Visit: Payer: Self-pay

## 2018-03-10 ENCOUNTER — Encounter (HOSPITAL_COMMUNITY): Payer: Self-pay | Admitting: Hematology

## 2018-03-10 ENCOUNTER — Inpatient Hospital Stay (HOSPITAL_COMMUNITY): Payer: Medicare HMO

## 2018-03-10 VITALS — BP 137/51 | HR 57 | Temp 97.6°F | Resp 16 | Wt 128.8 lb

## 2018-03-10 VITALS — BP 178/57 | HR 65 | Temp 97.9°F | Resp 18

## 2018-03-10 DIAGNOSIS — C787 Secondary malignant neoplasm of liver and intrahepatic bile duct: Principal | ICD-10-CM

## 2018-03-10 DIAGNOSIS — C259 Malignant neoplasm of pancreas, unspecified: Secondary | ICD-10-CM

## 2018-03-10 DIAGNOSIS — R634 Abnormal weight loss: Secondary | ICD-10-CM

## 2018-03-10 DIAGNOSIS — F17210 Nicotine dependence, cigarettes, uncomplicated: Secondary | ICD-10-CM

## 2018-03-10 DIAGNOSIS — R69 Illness, unspecified: Secondary | ICD-10-CM | POA: Diagnosis not present

## 2018-03-10 DIAGNOSIS — Z23 Encounter for immunization: Secondary | ICD-10-CM | POA: Diagnosis not present

## 2018-03-10 DIAGNOSIS — Z5111 Encounter for antineoplastic chemotherapy: Secondary | ICD-10-CM | POA: Diagnosis not present

## 2018-03-10 LAB — CBC WITH DIFFERENTIAL/PLATELET
ABS IMMATURE GRANULOCYTES: 0.01 10*3/uL (ref 0.00–0.07)
BASOS ABS: 0 10*3/uL (ref 0.0–0.1)
Basophils Relative: 1 %
Eosinophils Absolute: 0.3 10*3/uL (ref 0.0–0.5)
Eosinophils Relative: 7 %
HEMATOCRIT: 30.4 % — AB (ref 36.0–46.0)
HEMOGLOBIN: 9.5 g/dL — AB (ref 12.0–15.0)
IMMATURE GRANULOCYTES: 0 %
LYMPHS PCT: 24 %
Lymphs Abs: 1 10*3/uL (ref 0.7–4.0)
MCH: 28.5 pg (ref 26.0–34.0)
MCHC: 31.3 g/dL (ref 30.0–36.0)
MCV: 91.3 fL (ref 80.0–100.0)
Monocytes Absolute: 0.5 10*3/uL (ref 0.1–1.0)
Monocytes Relative: 12 %
NEUTROS ABS: 2.4 10*3/uL (ref 1.7–7.7)
NEUTROS PCT: 56 %
NRBC: 0 % (ref 0.0–0.2)
PLATELETS: 137 10*3/uL — AB (ref 150–400)
RBC: 3.33 MIL/uL — AB (ref 3.87–5.11)
RDW: 17.2 % — AB (ref 11.5–15.5)
WBC: 4.2 10*3/uL (ref 4.0–10.5)

## 2018-03-10 LAB — COMPREHENSIVE METABOLIC PANEL
ALT: 9 U/L (ref 0–44)
AST: 16 U/L (ref 15–41)
Albumin: 3.5 g/dL (ref 3.5–5.0)
Alkaline Phosphatase: 143 U/L — ABNORMAL HIGH (ref 38–126)
Anion gap: 7 (ref 5–15)
BUN: 23 mg/dL (ref 8–23)
CHLORIDE: 107 mmol/L (ref 98–111)
CO2: 26 mmol/L (ref 22–32)
Calcium: 8.5 mg/dL — ABNORMAL LOW (ref 8.9–10.3)
Creatinine, Ser: 1.34 mg/dL — ABNORMAL HIGH (ref 0.44–1.00)
GFR, EST AFRICAN AMERICAN: 40 mL/min — AB (ref >60)
GFR, EST NON AFRICAN AMERICAN: 35 mL/min — AB (ref >60)
Glucose, Bld: 96 mg/dL (ref 70–99)
POTASSIUM: 4.2 mmol/L (ref 3.5–5.1)
SODIUM: 140 mmol/L (ref 135–145)
Total Bilirubin: 0.7 mg/dL (ref 0.3–1.2)
Total Protein: 7 g/dL (ref 6.5–8.1)

## 2018-03-10 LAB — LACTATE DEHYDROGENASE: LDH: 162 U/L (ref 98–192)

## 2018-03-10 MED ORDER — SODIUM CHLORIDE 0.9 % IV SOLN
Freq: Once | INTRAVENOUS | Status: AC
  Administered 2018-03-10: 12:00:00 via INTRAVENOUS

## 2018-03-10 MED ORDER — SODIUM CHLORIDE 0.9 % IV SOLN
50.0000 mg/m2 | Freq: Once | INTRAVENOUS | Status: AC
  Administered 2018-03-10: 81.7 mg via INTRAVENOUS
  Filled 2018-03-10: qty 19

## 2018-03-10 MED ORDER — SODIUM CHLORIDE 0.9 % IV SOLN
1920.0000 mg/m2 | INTRAVENOUS | Status: DC
  Administered 2018-03-10: 3100 mg via INTRAVENOUS
  Filled 2018-03-10: qty 62

## 2018-03-10 MED ORDER — SODIUM CHLORIDE 0.9 % IV SOLN
Freq: Once | INTRAVENOUS | Status: AC
  Administered 2018-03-10: 12:00:00 via INTRAVENOUS
  Filled 2018-03-10: qty 4

## 2018-03-10 MED ORDER — SODIUM CHLORIDE 0.9% FLUSH
10.0000 mL | INTRAVENOUS | Status: DC | PRN
  Administered 2018-03-10: 10 mL
  Filled 2018-03-10: qty 10

## 2018-03-10 MED ORDER — LEUCOVORIN CALCIUM INJECTION 350 MG
700.0000 mg | Freq: Once | INTRAVENOUS | Status: AC
  Administered 2018-03-10: 700 mg via INTRAVENOUS
  Filled 2018-03-10: qty 35

## 2018-03-10 NOTE — Progress Notes (Signed)
[place], [place] [address]   CLINIC:  Medical Oncology/Hematology  PCP:  Fayrene Helper, MD [address], [place] [address] [phone]   REASON FOR VISIT: Follow-up for pancreatic cancer to the liver  CURRENT THERAPY: Infusional 5FU and liposomal Irinotecan   BRIEF ONCOLOGIC HISTORY:    Pancreatic cancer metastasized to liver ([facility])   03/10/2014 Initial Diagnosis    Pancreatic cancer metastasized to liver    03/22/2014 - 03/05/2016 Chemotherapy    Abraxane/Gemzar days 1, 8, every 28 days.  Day 15 was cancelled due to leukopenia and thrombocytopenia on day 15 cycle 1.    05/24/2014 Treatment Plan Change    Day 8 of cycle 3 is held with ANC of 1.1    06/05/2014 Imaging    CT C/A/P . Interval decrease in size of the pancreatic tail mass.Improved hepatic metastatic disease. No new lesions. No CT findings for metastatic disease involving the chest.    08/09/2014 Tumor Marker    CA 19-9= 33 (WNL)    10/26/2014 Imaging    MRI- Continued interval decrease in size of the hepatic metastatic lesions and no new lesions are identified. Continued decrease in size of the pancreatic tail lesion.    01/03/2015 Tumor Marker    Results for YVANNA, VIDAS (MRN [id number]) as of 01/18/2015 12:52  01/03/2015 10:00 CA 19-9: 14     01/17/2015 Imaging    MRI- Response to therapy of hepatic metastasis.  Similar size of a pancreatic tail lesion.    03/20/2015 Imaging    MRI-L spine- Severe disc space narrowing at L2-L3, with endplate reactive changes. Large disc extrusion into the ventral epidural space,central to the RIGHT with a cephalad migrated free fragment. Additional Large disc extrusion into the retroperitone...    03/22/2015 Imaging    CT pelvis- No evidence for metastatic disease within the pelvis.    07/24/2015 Imaging    MRI abd- Continued response to therapy, with no residual detectable liver metastases. No new sites of  metastatic disease in the abdomen.     08/15/2015 Code Status     She confirms desire for DNR status.    12/21/2015 Imaging    MRI liver- Severe image degradation due to motion artifact reducing diagnostic sensitivity and specificity. Reduce conspicuity of the pancreatic tail lesion suggesting further improvement. The original liver lesions have resolved.    03/05/2016 Treatment Plan Change    Chemotherapy holiday/Break after 2 years worth of treatment    04/07/2016 Imaging    CT chest- When compared to recent chest CT, new minimally displaced anterior left sixth rib fracture. Slight increase in subcarinal adenopathy    04/28/2016 Imaging    Bone density- BMD as determined from Femur Neck Right is 0.705 g/cm2 with a T-Score of -2.4. This patient is considered osteopenic according to [facility]) criteria. Compared with the prior study on 02/23/2013, the BMD of the lumbar spine/rt. femoral neck show a statistically significant decrease.     05/23/2016 Imaging    MRI abd- 1. Exam is significantly degraded by patient respiratory motion. Consider follow-up exams with CT abdomen with without contrast per pancreatic protocol. 2. Fullness in tail of pancreas is more prominent and potentially increased in size. Recommend close attention on follow-up. (Consider CT as above) 3. No explanation for back pain.    09/11/2016 Imaging    MRI pancreas- Interval progression of pancreatic tail lesion. New differential perfusion right liver with  areas of heterogeneity. Underlying metastatic disease in this region not excluded.    09/11/2016 Progression    MRi in conjunction with rising CA 19-9 are indicative of relapse of disease.    01/09/2017 Progression    CT C/A/P: 2.1 x 2.9 cm lesion in the pancreatic tail and abutting the splenic hilum, corresponding to known primary pancreatic neoplasm, mildly increased.  Scattered small hypoenhancing lesions in the liver measuring up  to 10 mm, approximately 8-10 in number, suspicious for hepatic metastases.  No findings specific for metastatic disease in the chest.  Additional ancillary findings as above.     01/27/2017 - 10/20/2017 Chemotherapy    The patient had gemcitabine (GEMZAR) 1,292 mg in sodium chloride 0.9 % 250 mL chemo infusion, 800 mg/m2 = 1,292 mg, Intravenous,  Once, 9 of 12 cycles Administration: 1,292 mg (01/28/2017), 1,292 mg (02/11/2017), 1,292 mg (02/25/2017), 1,292 mg (03/11/2017), 1,292 mg (03/25/2017), 1,292 mg (04/08/2017), 1,292 mg (04/22/2017), 1,292 mg (04/29/2017), 1,292 mg (05/20/2017), 1,292 mg (06/03/2017), 1,292 mg (06/24/2017), 1,292 mg (07/08/2017), 1,292 mg (07/29/2017), 1,292 mg (08/12/2017), 1,292 mg (08/26/2017), 1,292 mg (09/09/2017), 1,292 mg (09/23/2017), 1,292 mg (10/07/2017)  for chemotherapy treatment.     10/20/2017 -  Chemotherapy    The patient had leucovorin 700 mg in dextrose 5 % 250 mL infusion, 644 mg, Intravenous,  Once, 11 of 13 cycles Administration: 700 mg (10/21/2017), 700 mg (11/04/2017), 700 mg (11/18/2017), 700 mg (12/02/2017), 700 mg (12/16/2017), 700 mg (12/30/2017), 700 mg (01/13/2018), 700 mg (01/27/2018), 700 mg (02/10/2018), 700 mg (02/24/2018), 700 mg (03/10/2018) ondansetron (ZOFRAN) 8 mg in sodium chloride 0.9 % 50 mL IVPB, , Intravenous,  Once, 10 of 12 cycles Administration:  (11/04/2017),  (01/27/2018),  (02/10/2018),  (02/24/2018),  (03/10/2018) fluorouracil (ADRUCIL) 3,850 mg in sodium chloride 0.9 % 73 mL chemo infusion, 2,400 mg/m2 = 3,850 mg, Intravenous, 1 Day/Dose, 11 of 13 cycles Dose modification: 1,920 mg/m2 (original dose 2,400 mg/m2, Cycle 2, Reason: Provider Judgment, Comment: mucisitis) Administration: 3,850 mg (10/21/2017), 3,100 mg (11/04/2017), 3,100 mg (11/18/2017), 3,100 mg (12/02/2017), 3,100 mg (12/16/2017), 3,100 mg (12/30/2017), 3,100 mg (01/13/2018), 3,100 mg (01/27/2018), 3,100 mg (02/10/2018), 3,100 mg (02/24/2018), 3,100 mg (03/10/2018) irinotecan LIPOSOME (ONIVYDE)  81.7 mg in sodium chloride 0.9 % 500 mL chemo infusion, 50 mg/m2 = 81.7 mg (100 % of original dose 50 mg/m2), Intravenous, Once, 11 of 13 cycles Dose modification: 50 mg/m2 (original dose 50 mg/m2, Cycle 1, Reason: Provider Judgment), 50 mg/m2 (original dose 50 mg/m2, Cycle 2, Reason: Provider Judgment) Administration: 81.7 mg (10/21/2017), 81.7 mg (11/04/2017), 81.7 mg (11/18/2017), 81.7 mg (12/02/2017), 81.7 mg (12/16/2017), 81.7 mg (12/30/2017), 81.7 mg (01/13/2018), 81.7 mg (01/27/2018), 81.7 mg (02/10/2018), 81.7 mg (02/24/2018), 81.7 mg (03/10/2018)  for chemotherapy treatment.       CANCER STAGING: Cancer Staging Pancreatic cancer metastasized to liver [facility]) Staging form: Pancreas, AJCC 7th Edition - Clinical: Stage IV (T3, N1, M1) - Signed by Baird Cancer, PA-C on 03/16/2014 - Pathologic: No stage assigned - Unsigned    INTERVAL HISTORY:  Ms. Watrous 84 y.o. female returns for follow-up of pancreatic cancer in her next cycle of chemotherapy.  She received 10 cycles of chemotherapy 2 weeks ago on 02/24/2018.  She is tolerating it very well.  Denies any major diarrhea although she has some for few days.  She has mild fatigue lasting 3 to 4 days.  No abdominal cramping reported.  Neuropathy from previous chemo has been stable.  Denies any fevers or infections.  REVIEW OF SYSTEMS:  Review of Systems  Constitutional: Negative for fatigue.  Gastrointestinal: Positive for diarrhea.  Neurological: Positive for numbness.  All other systems reviewed and are negative.    PAST MEDICAL/SURGICAL HISTORY:  Past Medical History:  Diagnosis Date  . Anemia due to antineoplastic chemotherapy 09/12/2015   Started Aranesp 500 mcg on 09/12/2015  . Anxiety   . Chronic diarrhea   . Depression   . DNR (do not resuscitate) 08/16/2015  . Erosive esophagitis   . GERD (gastroesophageal reflux disease)   . Hx of adenomatous colonic polyps    tubular adenomas, last found in 2008  . Hyperplastic colon polyp  03/19/10   tcs by Dr. Gala Romney  . Hypertension 20 years   . Kidney stone    hx/ crushed   . Opioid contract exists 04/18/2015   With Dr. Moshe Cipro  . Pancreatic cancer ([facility]) 02/2014  . Pancreatic cancer metastasized to liver ([facility]) 03/10/2014  . Schatzki's ring 08/26/10   Last dilated on EGD by Dr. Trevor Iha [facility], linear gastric erosions, BI hemigastrectomy   Past Surgical History:  Procedure Laterality Date  . BREAST LUMPECTOMY Left   . bunion removal     from both feet   . CHOLECYSTECTOMY  1965   . COLONOSCOPY  03/19/2010   DR Gala Romney,, normal TI, pancolonic diverticula, random colon bx neg., hyperplastic polyps removed  . ESOPHAGOGASTRODUODENOSCOPY  11/22/2003   DR Gala Romney, erosive RE, Billroth I  . ESOPHAGOGASTRODUODENOSCOPY  08/26/10   Dr. Gala Romney- moderate severe ERE, Scahtzki ring s/p dilation, Billroth I, linear gastric erosions, bx-gastric xanthelasma  . LEFT SHOULDER SURGERY  2009   DR HARRISON  . ORIF ANKLE FRACTURE Right 05/22/2013   Procedure: OPEN REDUCTION INTERNAL FIXATION (ORIF) RIGHT ANKLE FRACTURE;  Surgeon: Sanjuana Kava, MD;  Location: [facility];  Service: Orthopedics;  Laterality: Right;  . stomach ulcer  50 years ago    had some of her stomach removed      SOCIAL HISTORY:  Social History   Socioeconomic History  . Marital status: Widowed    Spouse name: Not on file  . Number of children: 2  . Years of education: Not on file  . Highest education level: Not on file  Occupational History  . Occupation: retired from Estate manager/land agent: RETIRED  Social Needs  . Financial resource strain: Not on file  . Food insecurity:    Worry: Not on file    Inability: Not on file  . Transportation needs:    Medical: Not on file    Non-medical: Not on file  Tobacco Use  . Smoking status: Current Some Day Smoker    Packs/day: 0.50    Years: 60.00    Pack years: 30.00    Types: Cigarettes    Last attempt to quit: 09/09/2016    Years since quitting: 1.4  . Smokeless tobacco:  Never Used  Substance and Sexual Activity  . Alcohol use: No  . Drug use: No  . Sexual activity: Never  Lifestyle  . Physical activity:    Days per week: Not on file    Minutes per session: Not on file  . Stress: Not on file  Relationships  . Social connections:    Talks on phone: Not on file    Gets together: Not on file    Attends religious service: Not on file    Active member of club or organization: Not on file    Attends meetings of clubs or organizations: Not on file  Relationship status: Not on file  . Intimate partner violence:    Fear of current or ex partner: Not on file    Emotionally abused: Not on file    Physically abused: Not on file    Forced sexual activity: Not on file  Other Topics Concern  . Not on file  Social History Narrative  . Not on file    FAMILY HISTORY:  Family History  Problem Relation Age of Onset  . Hypertension Mother   . Kidney failure Brother        on dialysis  . Diabetes Sister   . Diabetes Sister   . Hypertension Father   . Kidney failure Sister   . Kidney Stones Brother   . Breast cancer Son   . Gout Son   . Liver disease Neg Hx   . Colon cancer Neg Hx     CURRENT MEDICATIONS:  Outpatient Encounter Medications as of 03/10/2018  Medication Sig Note  . acetaminophen (TYLENOL) 500 MG tablet Take 500 mg by mouth every 6 (six) hours as needed for mild pain or moderate pain.    Marland Kitchen amLODipine (NORVASC) 10 MG tablet TAKE 1 TABLET BY MOUTH EVERY DAY   . clonazePAM (KLONOPIN) 0.5 MG tablet TAKE 1 TABLET BY MOUTH EVERY DAY AS NEEDED FOR ANXIETY   . cycloSPORINE (RESTASIS) 0.05 % ophthalmic emulsion Place 1 drop into both eyes 2 (two) times daily.   . diphenhydrAMINE (BENADRYL) 50 MG tablet Take 1 tablet 1 hour prior to CT   . dronabinol (MARINOL) 2.5 MG capsule Take 2.5 mg by mouth daily.   . fluorometholone (FML) 0.1 % ophthalmic suspension INSTILL 1 DROP INTO EACH EYE THREE TIMES DAILY FOR 14 DAYS   . lidocaine (XYLOCAINE) 2 %  solution RINSE WITH 5ML AS NEEDED TO RELIEVE MOUTH PAIN.   . metoprolol tartrate (LOPRESSOR) 25 MG tablet Take 1 tablet by mouth 2 (two) times daily. 12/02/2017: Taking once a day   . metoprolol tartrate (LOPRESSOR) 25 MG tablet TAKE 1 TABLET BY MOUTH TWICE DAILY   . mirabegron ER (MYRBETRIQ) 25 MG TB24 tablet Take 1 tablet (25 mg total) by mouth daily.   . mirtazapine (REMERON) 7.5 MG tablet Take 1 tablet (7.5 mg total) by mouth at bedtime.   . nicotine (NICODERM CQ - DOSED IN MG/24 HR) 7 mg/24hr patch Place 1 patch (7 mg total) onto the skin daily.   Marland Kitchen olmesartan (BENICAR) 20 MG tablet Take 1 tablet (20 mg total) by mouth daily.   . pantoprazole (PROTONIX) 20 MG tablet TAKE 1 TABLET(20 MG) BY MOUTH DAILY   . potassium chloride (K-DUR) 10 MEQ tablet Take 1 tablet (10 mEq total) by mouth 2 (two) times daily.   . predniSONE (DELTASONE) 50 MG tablet Take one tab at 11pm, one tab at 5am and then one tab at 11am   . traMADol (ULTRAM) 50 MG tablet One tablet once daily, as needed, for uncontrolled pain   . [DISCONTINUED] prochlorperazine (COMPAZINE) 10 MG tablet Take 1 tablet (10 mg total) by mouth every 6 (six) hours as needed (Nausea or vomiting).    Facility-Administered Encounter Medications as of 03/10/2018  Medication  . dexamethasone (DECADRON) 10 mg in sodium chloride 0.9 % 50 mL IVPB    ALLERGIES:  Allergies  Allergen Reactions  . Iohexol Hives and Shortness Of Breath    **Patient can not have IV contrast, pt has breakthrough reaction even after 13 hour premeds** Do not give IV contrast PATIENT HAD TO  BE TAKEN TO ED, C/O WHELPS, HIVES, DIFFICULTY BREATHING. PATIENT GIVEN IV CONTRAST AFTER 13 HOUR PRE MEDS ON 01/09/2017 WITH NO REACTION NOTED. On 10/19/17 patient had reaction despite premedication.    Marland Kitchen Aciphex [Rabeprazole Sodium] Other (See Comments)    unknown  . Amlodipine Besylate-Valsartan     Rash to high-dose (5/320)  . Esomeprazole Magnesium   . Omeprazole Other (See Comments)      Patient states "medication didn't work"  . Ciprofloxacin Rash  . Penicillins Swelling and Rash    Has patient had a PCN reaction causing immediate rash, facial/tongue/throat swelling, SOB or lightheadedness with hypotension: Yes Has patient had a PCN reaction causing severe rash involving mucus membranes or skin necrosis: Yes Has patient had a PCN reaction that required hospitalization: No Has patient had a PCN reaction occurring within the last 10 years: yes If all of the above answers are "NO", then may proceed with Cephalosporin use.      PHYSICAL EXAM:  ECOG Performance status: 1  Vitals:   03/10/18 0924  BP: (!) 137/51  Pulse: (!) 57  Resp: 16  Temp: 97.6 F (36.4 C)  SpO2: 100%   Filed Weights   03/10/18 0924  Weight: 128 lb 12.8 oz (58.4 kg)    Physical Exam  Constitutional: She is oriented to person, place, and time. She appears well-developed and well-nourished.  Neck:  Right sided enlarged knot base of neck  Cardiovascular: Normal rate, regular rhythm and normal heart sounds.  Pulmonary/Chest: Effort normal and breath sounds normal.  Musculoskeletal: Normal range of motion.  Neurological: She is alert and oriented to person, place, and time.  Skin: Skin is warm and dry.  Psychiatric: She has a normal mood and affect. Her behavior is normal. Judgment and thought content normal.      LABORATORY DATA:  I have reviewed the labs as listed.  CBC    Component Value Date/Time   WBC 4.2 03/10/2018 0902   RBC 3.33 (L) 03/10/2018 0902   HGB 9.5 (L) 03/10/2018 0902   HCT 30.4 (L) 03/10/2018 0902   PLT 137 (L) 03/10/2018 0902   MCV 91.3 03/10/2018 0902   MCH 28.5 03/10/2018 0902   MCHC 31.3 03/10/2018 0902   RDW 17.2 (H) 03/10/2018 0902   LYMPHSABS 1.0 03/10/2018 0902   MONOABS 0.5 03/10/2018 0902   EOSABS 0.3 03/10/2018 0902   BASOSABS 0.0 03/10/2018 0902   CMP Latest Ref Rng & Units 03/10/2018 02/24/2018 02/10/2018  Glucose 70 - 99 mg/dL 96 126(H)  103(H)  BUN 8 - 23 mg/dL _0 Creatinine 0.44 - 1.00 mg/dL 1.34(H) 1.38(H) 1.33(H)  Sodium 135 - 145 mmol/L 140 140 140  Potassium 3.5 - 5.1 mmol/L 4.2 3.9 3.6  Chloride 98 - 111 mmol/L 107 107 108  CO2 22 - 32 mmol/L _1 Calcium 8.9 - 10.3 mg/dL 8.5(L) 8.5(L) 8.2(L)  Total Protein 6.5 - 8.1 g/dL 7.0 7.0 6.4(L)  Total Bilirubin 0.3 - 1.2 mg/dL 0.7 0.6 0.5  Alkaline Phos 38 - 126 U/L 143(H) 142(H) 132(H)  AST 15 - 41 U/L 16 14(L) 17  ALT 0 - 44 U/L _2 ASSESSMENT & PLAN:   Pancreatic cancer metastasized to liver ([facility]) 1.  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 28 days started on 01/28/2017 -CT scan done on 07/13/2017 shows stable liver disease. CA 19-9 was on 06/24/2017 at 129.  CA-19-9 at last visit went up to 171. - I discussed the latest CT scan reports which showed progression in the liver lesions which are very subtle.  There is progression in the tail of the pancreas lesion measuring 27 x 27 mm.  Even though the progression is very small, her CA 19-9 is continuing to go up, last one at 171.  Hence I have recommended a change in her treatment.  Gemcitabine and Tarceva were discontinued. - First cycle of infusional 5-FU and liposomal Irinotecan started on 10/21/2017, Onivyde started at a decreased dose of 50 mg/m.  The dose of infusional 5-FU was reduced by 20% after cycle 1 secondary to mucositis. - Cycle 6 chemotherapy was on 12/30/2017.  She is tolerating it very well.  We did reviewed the results of CEA which has come down to 206 on 12/30/2017.  This was 316 in June. - CT scan of the CAP on 01/04/2018 cycles without contrast done due to allergy, showed stable mass in the pancreatic tail and less distinctively seen hepatic  metastasis secondary to lack of IV contrast with no new metastasis.  This is consistent with treatment response. - She has completed 10 cycles of chemotherapy on 02/24/2018.  She is tolerating it very well.  She has mild tiredness.  Her weight has gone up by 3 pounds. - Her CA 19-9 has come down to 100.  Hence we will proceed with cycle 11 chemotherapy today.  She will come back in 2 weeks for her 12th cycle.  I will schedule repeat CT scans after cycle 12.  I will see her back in 4 weeks.  2.  Weight loss: She will continue Marinol 2.5 mg twice daily.  Her weight has gone up by 3 pounds since last visit.  3.  Genetic testing: -We have made a consultation with geneticist for BRCA1/2 testing.  We will also consider MSI testing.      Orders placed this encounter:  Orders Placed This Encounter  Procedures  . CT Chest W Contrast  . CT Abdomen Pelvis W Contrast      Derek Jack, MD [place] [phone]

## 2018-03-10 NOTE — Patient Instructions (Signed)
[facility] Discharge Instructions for Patients Receiving Chemotherapy  Today you received the following chemotherapy agents   To help prevent nausea and vomiting after your treatment, we encourage you to take your nausea medication   If you develop nausea and vomiting that is not controlled by your nausea medication, call the clinic.   BELOW ARE SYMPTOMS THAT SHOULD BE REPORTED IMMEDIATELY:  *FEVER GREATER THAN 100.5 F  *CHILLS WITH OR WITHOUT FEVER  NAUSEA AND VOMITING THAT IS NOT CONTROLLED WITH YOUR NAUSEA MEDICATION  *UNUSUAL SHORTNESS OF BREATH  *UNUSUAL BRUISING OR BLEEDING  TENDERNESS IN MOUTH AND THROAT WITH OR WITHOUT PRESENCE OF ULCERS  *URINARY PROBLEMS  *BOWEL PROBLEMS  UNUSUAL RASH Items with * indicate a potential emergency and should be followed up as soon as possible.  Feel free to call the clinic should you have any questions or concerns. The clinic phone number is [phone].  Please show the CHEMO ALERT CARD at check-in to the Emergency Department and triage nurse.   

## 2018-03-10 NOTE — Progress Notes (Signed)
Treatment given today per MD orders. Tolerated infusion without adverse affects. Vital signs stable. No complaints at this time. 57fu pump infusion per protocol.  Discharged from clinic ambulatory. F/U with [facility] as scheduled.

## 2018-03-10 NOTE — Patient Instructions (Signed)
[facility] Discharge Instructions     Thank you for choosing [facility] at [facility] to provide your oncology and hematology care.  To afford each patient quality time with our provider, please arrive at least 15 minutes before your scheduled appointment time.   If you have a lab appointment with the Cancer Center please come in thru the  Main Entrance and check in at the main information desk  You need to re-schedule your appointment should you arrive 10 or more minutes late.  We strive to give you quality time with our providers, and arriving late affects you and other patients whose appointments are after yours.  Also, if you no show three or more times for appointments you may be dismissed from the clinic at the providers discretion.     Again, thank you for choosing [facility].  Our hope is that these requests will decrease the amount of time that you wait before being seen by our physicians.       _____________________________________________________________  Should you have questions after your visit to [facility], please contact our office at [phone] between the hours of 8:00 a.m. and 4:30 p.m.  Voicemails left after 4:00 p.m. will not be returned until the following business day.  For prescription refill requests, have your pharmacy contact our office and allow 72 hours.    Cancer Center Support Programs:   > Cancer Support Group  2nd Tuesday of the month 1pm-2pm, Journey Room    

## 2018-03-10 NOTE — Progress Notes (Signed)
Labs reviewed with MD at office visit, will proceed with treatments today.

## 2018-03-10 NOTE — Assessment & Plan Note (Signed)
1.  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 28 days started on 01/28/2017 -CT scan done on 07/13/2017 shows stable liver disease. CA 19-9 was on 06/24/2017 at 129.  CA-19-9 at last visit went up to 171. - I discussed the latest CT scan reports which showed progression in the liver lesions which are very subtle.  There is progression in the tail of the pancreas lesion measuring 27 x 27 mm.  Even though the progression is very small, her CA 19-9 is continuing to go up, last one at 171.  Hence I have recommended a change in her treatment.  Gemcitabine and Tarceva were discontinued. - First cycle of infusional 5-FU and liposomal Irinotecan started on 10/21/2017, Onivyde started at a decreased dose of 50 mg/m.  The dose of infusional 5-FU was reduced by 20% after cycle 1 secondary to mucositis. - Cycle 6 chemotherapy was on 12/30/2017.  She is tolerating it very well.  We did reviewed the results of CEA which has come down to 206 on 12/30/2017.  This was 316 in June. - CT scan of the CAP on 01/04/2018 cycles without contrast done due to allergy, showed stable mass in the pancreatic tail and less distinctively seen hepatic metastasis secondary to lack of IV contrast with no new metastasis.  This is consistent with treatment response. - She has completed 10 cycles of chemotherapy on 02/24/2018.  She is tolerating it very well.  She has mild tiredness.  Her weight has gone up by 3 pounds. - Her CA 19-9 has come down to 100.  Hence we will proceed with cycle 11 chemotherapy today.  She will come back in 2 weeks for her 12th cycle.  I will schedule repeat CT scans after cycle 12.  I will see her back in 4 weeks.  2.  Weight loss: She will continue Marinol 2.5 mg twice daily.  Her  weight has gone up by 3 pounds since last visit.  3.  Genetic testing: -We have made a consultation with geneticist for BRCA1/2 testing.  We will also consider MSI testing.

## 2018-03-11 LAB — CANCER ANTIGEN 19-9: CA 19-9: 110 U/mL — ABNORMAL HIGH (ref 0–35)

## 2018-03-12 ENCOUNTER — Inpatient Hospital Stay (HOSPITAL_COMMUNITY): Payer: Medicare HMO | Attending: Hematology

## 2018-03-12 ENCOUNTER — Encounter (HOSPITAL_COMMUNITY): Payer: Self-pay

## 2018-03-12 VITALS — BP 150/60 | HR 56 | Temp 98.9°F | Resp 16

## 2018-03-12 DIAGNOSIS — Z5111 Encounter for antineoplastic chemotherapy: Secondary | ICD-10-CM | POA: Insufficient documentation

## 2018-03-12 DIAGNOSIS — C252 Malignant neoplasm of tail of pancreas: Secondary | ICD-10-CM | POA: Insufficient documentation

## 2018-03-12 DIAGNOSIS — C787 Secondary malignant neoplasm of liver and intrahepatic bile duct: Secondary | ICD-10-CM | POA: Diagnosis not present

## 2018-03-12 DIAGNOSIS — C259 Malignant neoplasm of pancreas, unspecified: Secondary | ICD-10-CM

## 2018-03-12 MED ORDER — SODIUM CHLORIDE 0.9% FLUSH
10.0000 mL | INTRAVENOUS | Status: DC | PRN
  Administered 2018-03-12: 10 mL
  Filled 2018-03-12: qty 10

## 2018-03-12 MED ORDER — HEPARIN SOD (PORK) LOCK FLUSH 100 UNIT/ML IV SOLN
500.0000 [IU] | Freq: Once | INTRAVENOUS | Status: AC | PRN
  Administered 2018-03-12: 500 [IU]

## 2018-03-12 NOTE — Progress Notes (Signed)
[name] presents to have home infusion pump d/c'd and for port-a-cath deaccess with flush.  Portacath located right chest wall accessed with  H 20 needle.  Good blood return present. Portacath flushed with NS and 500U/4ml Heparin, and needle removed intact.  Procedure tolerated well and without incident.  Discharged ambulatory in c/o son.

## 2018-03-17 ENCOUNTER — Other Ambulatory Visit (HOSPITAL_COMMUNITY): Payer: Medicare HMO

## 2018-03-19 ENCOUNTER — Other Ambulatory Visit: Payer: Self-pay | Admitting: Family Medicine

## 2018-03-22 ENCOUNTER — Ambulatory Visit (INDEPENDENT_AMBULATORY_CARE_PROVIDER_SITE_OTHER): Payer: Medicare HMO | Admitting: Otolaryngology

## 2018-03-22 DIAGNOSIS — H9209 Otalgia, unspecified ear: Secondary | ICD-10-CM | POA: Diagnosis not present

## 2018-03-22 DIAGNOSIS — H9313 Tinnitus, bilateral: Secondary | ICD-10-CM

## 2018-03-22 DIAGNOSIS — H6123 Impacted cerumen, bilateral: Secondary | ICD-10-CM | POA: Diagnosis not present

## 2018-03-23 DIAGNOSIS — C259 Malignant neoplasm of pancreas, unspecified: Secondary | ICD-10-CM | POA: Diagnosis not present

## 2018-03-23 DIAGNOSIS — C787 Secondary malignant neoplasm of liver and intrahepatic bile duct: Secondary | ICD-10-CM | POA: Diagnosis not present

## 2018-03-24 ENCOUNTER — Inpatient Hospital Stay (HOSPITAL_COMMUNITY): Payer: Medicare HMO

## 2018-03-24 ENCOUNTER — Other Ambulatory Visit (HOSPITAL_COMMUNITY): Payer: Self-pay | Admitting: Hematology

## 2018-03-24 ENCOUNTER — Encounter (HOSPITAL_COMMUNITY): Payer: Self-pay

## 2018-03-24 VITALS — BP 162/63 | HR 57 | Temp 97.4°F | Resp 18 | Wt 126.6 lb

## 2018-03-24 DIAGNOSIS — C787 Secondary malignant neoplasm of liver and intrahepatic bile duct: Secondary | ICD-10-CM | POA: Diagnosis not present

## 2018-03-24 DIAGNOSIS — C252 Malignant neoplasm of tail of pancreas: Secondary | ICD-10-CM | POA: Diagnosis not present

## 2018-03-24 DIAGNOSIS — C259 Malignant neoplasm of pancreas, unspecified: Secondary | ICD-10-CM

## 2018-03-24 DIAGNOSIS — Z5111 Encounter for antineoplastic chemotherapy: Secondary | ICD-10-CM | POA: Diagnosis not present

## 2018-03-24 LAB — COMPREHENSIVE METABOLIC PANEL
ALK PHOS: 140 U/L — AB (ref 38–126)
ALT: 8 U/L (ref 0–44)
ANION GAP: 7 (ref 5–15)
AST: 14 U/L — ABNORMAL LOW (ref 15–41)
Albumin: 3.7 g/dL (ref 3.5–5.0)
BUN: 20 mg/dL (ref 8–23)
CALCIUM: 8.7 mg/dL — AB (ref 8.9–10.3)
CO2: 27 mmol/L (ref 22–32)
CREATININE: 1.23 mg/dL — AB (ref 0.44–1.00)
Chloride: 106 mmol/L (ref 98–111)
GFR calc Af Amer: 44 mL/min — ABNORMAL LOW (ref >60)
GFR, EST NON AFRICAN AMERICAN: 38 mL/min — AB (ref >60)
Glucose, Bld: 109 mg/dL — ABNORMAL HIGH (ref 70–99)
Potassium: 4 mmol/L (ref 3.5–5.1)
Sodium: 140 mmol/L (ref 135–145)
TOTAL PROTEIN: 7.4 g/dL (ref 6.5–8.1)
Total Bilirubin: 0.6 mg/dL (ref 0.3–1.2)

## 2018-03-24 LAB — CBC WITH DIFFERENTIAL/PLATELET
ABS IMMATURE GRANULOCYTES: 0 10*3/uL (ref 0.00–0.07)
BASOS ABS: 0 10*3/uL (ref 0.0–0.1)
Basophils Relative: 1 %
EOS ABS: 0.2 10*3/uL (ref 0.0–0.5)
Eosinophils Relative: 5 %
HCT: 32.3 % — ABNORMAL LOW (ref 36.0–46.0)
HEMOGLOBIN: 10.1 g/dL — AB (ref 12.0–15.0)
Immature Granulocytes: 0 %
Lymphocytes Relative: 22 %
Lymphs Abs: 1 10*3/uL (ref 0.7–4.0)
MCH: 28.5 pg (ref 26.0–34.0)
MCHC: 31.3 g/dL (ref 30.0–36.0)
MCV: 91 fL (ref 80.0–100.0)
MONO ABS: 0.2 10*3/uL (ref 0.1–1.0)
Monocytes Relative: 5 %
NEUTROS ABS: 2.9 10*3/uL (ref 1.7–7.7)
Neutrophils Relative %: 67 %
Platelets: 141 10*3/uL — ABNORMAL LOW (ref 150–400)
RBC: 3.55 MIL/uL — AB (ref 3.87–5.11)
RDW: 17 % — ABNORMAL HIGH (ref 11.5–15.5)
WBC: 4.3 10*3/uL (ref 4.0–10.5)
nRBC: 0 % (ref 0.0–0.2)

## 2018-03-24 MED ORDER — LEUCOVORIN CALCIUM INJECTION 350 MG
700.0000 mg | Freq: Once | INTRAVENOUS | Status: AC
  Administered 2018-03-24: 700 mg via INTRAVENOUS
  Filled 2018-03-24: qty 35

## 2018-03-24 MED ORDER — SODIUM CHLORIDE 0.9 % IV SOLN
1920.0000 mg/m2 | INTRAVENOUS | Status: DC
  Administered 2018-03-24: 3100 mg via INTRAVENOUS
  Filled 2018-03-24: qty 50

## 2018-03-24 MED ORDER — SODIUM CHLORIDE 0.9% FLUSH
10.0000 mL | INTRAVENOUS | Status: DC | PRN
  Administered 2018-03-24: 10 mL
  Filled 2018-03-24: qty 10

## 2018-03-24 MED ORDER — SODIUM CHLORIDE 0.9 % IV SOLN
50.0000 mg/m2 | Freq: Once | INTRAVENOUS | Status: AC
  Administered 2018-03-24: 81.7 mg via INTRAVENOUS
  Filled 2018-03-24: qty 19

## 2018-03-24 MED ORDER — SODIUM CHLORIDE 0.9 % IV SOLN
Freq: Once | INTRAVENOUS | Status: AC
  Administered 2018-03-24: 10:00:00 via INTRAVENOUS

## 2018-03-24 MED ORDER — SODIUM CHLORIDE 0.9 % IV SOLN
Freq: Once | INTRAVENOUS | Status: AC
  Administered 2018-03-24: 10:00:00 via INTRAVENOUS
  Filled 2018-03-24: qty 4

## 2018-03-24 NOTE — Patient Instructions (Signed)
[facility] Discharge Instructions for Patients Receiving Chemotherapy   Beginning January 23rd 2017 lab work for the Cancer Center will be done in the  Main lab at [place] on 1st floor. If you have a lab appointment with the Cancer Center please come in thru the  Main Entrance and check in at the main information desk   Today you received the following chemotherapy agents Irinotecan Liposomal, Leucovorin and 5FU. Follow-up as scheduled. Call clinic for any questions or concerns  To help prevent nausea and vomiting after your treatment, we encourage you to take your nausea medication   If you develop nausea and vomiting, or diarrhea that is not controlled by your medication, call the clinic.  The clinic phone number is [phone]. Office hours are Monday-Friday 8:30am-5:00pm.  BELOW ARE SYMPTOMS THAT SHOULD BE REPORTED IMMEDIATELY:  *FEVER GREATER THAN 101.0 F  *CHILLS WITH OR WITHOUT FEVER  NAUSEA AND VOMITING THAT IS NOT CONTROLLED WITH YOUR NAUSEA MEDICATION  *UNUSUAL SHORTNESS OF BREATH  *UNUSUAL BRUISING OR BLEEDING  TENDERNESS IN MOUTH AND THROAT WITH OR WITHOUT PRESENCE OF ULCERS  *URINARY PROBLEMS  *BOWEL PROBLEMS  UNUSUAL RASH Items with * indicate a potential emergency and should be followed up as soon as possible. If you have an emergency after office hours please contact your primary care physician or go to the nearest emergency department.  Please call the clinic during office hours if you have any questions or concerns.   You may also contact the Patient Navigator at [phone] should you have any questions or need assistance in obtaining follow up care.      Resources For Cancer Patients and their Caregivers ? American Cancer Society: Can assist with transportation, wigs, general needs, runs Look Good Feel Better.        1-888-227-6333 ? Cancer Care: Provides financial assistance, online support groups, medication/co-pay  assistance.  1-800-813-HOPE (4673) ? [facility] Assists [place] cancer patients and their families through emotional , educational and financial support.  336-427-4357 ? [place] DSS Where to apply for food stamps, Medicaid and utility assistance. 336-342-1394 ? [facility]: Transportation to medical appointments. 336-347-2287 ? Social Security Administration: May apply for disability if have a Stage IV cancer. 336-342-7796 1-800-772-1213 ? [place] [facility], Disability and Transit Services: Assists with nutrition, care and transit needs. 336-349-2343         

## 2018-03-24 NOTE — Progress Notes (Signed)
[name] tolerated chemo tx well without complaints or incident.Labs reviewed prior to administering chemotherapy today. Pt discharged with 5FU pump infusing without issues. VSS upon discharge. Pt discharged self ambulatory using cane in satisfactory condition

## 2018-03-25 ENCOUNTER — Ambulatory Visit (INDEPENDENT_AMBULATORY_CARE_PROVIDER_SITE_OTHER): Payer: Medicare HMO | Admitting: Otolaryngology

## 2018-03-25 ENCOUNTER — Encounter: Payer: Self-pay | Admitting: Family Medicine

## 2018-03-25 ENCOUNTER — Encounter (HOSPITAL_COMMUNITY): Payer: Medicare HMO | Admitting: Genetic Counselor

## 2018-03-25 DIAGNOSIS — H903 Sensorineural hearing loss, bilateral: Secondary | ICD-10-CM | POA: Diagnosis not present

## 2018-03-25 DIAGNOSIS — H9209 Otalgia, unspecified ear: Secondary | ICD-10-CM

## 2018-03-26 ENCOUNTER — Inpatient Hospital Stay (HOSPITAL_COMMUNITY): Payer: Medicare HMO

## 2018-03-26 ENCOUNTER — Encounter (HOSPITAL_COMMUNITY): Payer: Self-pay

## 2018-03-26 VITALS — BP 143/51 | HR 55 | Temp 97.8°F | Resp 18

## 2018-03-26 DIAGNOSIS — C252 Malignant neoplasm of tail of pancreas: Secondary | ICD-10-CM | POA: Diagnosis not present

## 2018-03-26 DIAGNOSIS — Z5111 Encounter for antineoplastic chemotherapy: Secondary | ICD-10-CM | POA: Diagnosis not present

## 2018-03-26 DIAGNOSIS — C787 Secondary malignant neoplasm of liver and intrahepatic bile duct: Secondary | ICD-10-CM | POA: Diagnosis not present

## 2018-03-26 DIAGNOSIS — C259 Malignant neoplasm of pancreas, unspecified: Secondary | ICD-10-CM

## 2018-03-26 MED ORDER — SODIUM CHLORIDE 0.9% FLUSH
10.0000 mL | INTRAVENOUS | Status: DC | PRN
  Administered 2018-03-26: 10 mL
  Filled 2018-03-26: qty 10

## 2018-03-26 MED ORDER — HEPARIN SOD (PORK) LOCK FLUSH 100 UNIT/ML IV SOLN
500.0000 [IU] | Freq: Once | INTRAVENOUS | Status: AC | PRN
  Administered 2018-03-26: 500 [IU]
  Filled 2018-03-26: qty 5

## 2018-03-26 NOTE — Progress Notes (Unsigned)
[name] tolerated 5FU pump well without complaints or incident. 5FU pump discontinued with port flushed with 10 ml NS and 5 ml Heparin easily per protocol then de-accessed. VSS Pt discharged self ambulatory using cane in satisfactory condition accompanied by family member

## 2018-03-26 NOTE — Patient Instructions (Signed)
[facility] at [facility] Discharge Instructions  5FU pump discontinued with portacath flushed per protocol. Follow-up as scheduled. Call clinic for any questions or concerns   Thank you for choosing [facility] at [facility] to provide your oncology and hematology care.  To afford each patient quality time with our provider, please arrive at least 15 minutes before your scheduled appointment time.   If you have a lab appointment with the Cancer Center please come in thru the  Main Entrance and check in at the main information desk  You need to re-schedule your appointment should you arrive 10 or more minutes late.  We strive to give you quality time with our providers, and arriving late affects you and other patients whose appointments are after yours.  Also, if you no show three or more times for appointments you may be dismissed from the clinic at the providers discretion.     Again, thank you for choosing [facility].  Our hope is that these requests will decrease the amount of time that you wait before being seen by our physicians.       _____________________________________________________________  Should you have questions after your visit to [facility], please contact our office at [phone] between the hours of 8:00 a.m. and 4:30 p.m.  Voicemails left after 4:00 p.m. will not be returned until the following business day.  For prescription refill requests, have your pharmacy contact our office and allow 72 hours.    Cancer Center Support Programs:   > Cancer Support Group  2nd Tuesday of the month 1pm-2pm, Journey Room   

## 2018-03-29 ENCOUNTER — Other Ambulatory Visit (HOSPITAL_COMMUNITY): Payer: Self-pay | Admitting: Nurse Practitioner

## 2018-03-29 DIAGNOSIS — C787 Secondary malignant neoplasm of liver and intrahepatic bile duct: Principal | ICD-10-CM

## 2018-03-29 DIAGNOSIS — C259 Malignant neoplasm of pancreas, unspecified: Secondary | ICD-10-CM

## 2018-03-30 ENCOUNTER — Encounter: Payer: Self-pay | Admitting: Family Medicine

## 2018-03-30 ENCOUNTER — Ambulatory Visit (INDEPENDENT_AMBULATORY_CARE_PROVIDER_SITE_OTHER): Payer: Medicare HMO

## 2018-03-30 ENCOUNTER — Ambulatory Visit: Payer: Medicare HMO | Admitting: Family Medicine

## 2018-03-30 VITALS — BP 140/70 | HR 88 | Resp 16 | Ht 63.0 in | Wt 127.0 lb

## 2018-03-30 VITALS — BP 139/64 | HR 55 | Resp 10 | Ht 63.0 in | Wt 126.0 lb

## 2018-03-30 DIAGNOSIS — M150 Primary generalized (osteo)arthritis: Secondary | ICD-10-CM | POA: Diagnosis not present

## 2018-03-30 DIAGNOSIS — N3941 Urge incontinence: Secondary | ICD-10-CM

## 2018-03-30 DIAGNOSIS — I10 Essential (primary) hypertension: Secondary | ICD-10-CM

## 2018-03-30 DIAGNOSIS — Z13220 Encounter for screening for lipoid disorders: Secondary | ICD-10-CM

## 2018-03-30 DIAGNOSIS — Z0000 Encounter for general adult medical examination without abnormal findings: Secondary | ICD-10-CM

## 2018-03-30 DIAGNOSIS — F5105 Insomnia due to other mental disorder: Secondary | ICD-10-CM

## 2018-03-30 DIAGNOSIS — F419 Anxiety disorder, unspecified: Secondary | ICD-10-CM

## 2018-03-30 DIAGNOSIS — F17218 Nicotine dependence, cigarettes, with other nicotine-induced disorders: Secondary | ICD-10-CM

## 2018-03-30 DIAGNOSIS — R69 Illness, unspecified: Secondary | ICD-10-CM | POA: Diagnosis not present

## 2018-03-30 DIAGNOSIS — F320 Major depressive disorder, single episode, mild: Secondary | ICD-10-CM

## 2018-03-30 DIAGNOSIS — F409 Phobic anxiety disorder, unspecified: Secondary | ICD-10-CM

## 2018-03-30 MED ORDER — METOPROLOL TARTRATE 25 MG PO TABS: ORAL_TABLET | ORAL | 3 refills | Status: DC

## 2018-03-30 NOTE — Patient Instructions (Addendum)
Wellness with nurse past due, please schedule  Physical exam with MD April 3 or after  Please only tylenol for pain  Fasting lipid, cmp and Egfr, TSH and vit D 1 to 2 weeks  Before 2020 visit with MD   Do exercise to strengthen  Pelvic muscles, , reduce coffee and tea and mountain dew, need water  Keep eating everything you want to, but watch the liquids  Thank you  for choosing [facility]. We consider it a privelige to serve you.  Delivering excellent health care in a caring and  compassionate way is our goal.  Partnering with you,  so that together we can achieve this goal is our strategy.

## 2018-03-30 NOTE — Patient Instructions (Signed)
[name] , Thank you for taking time to come for your Medicare Wellness Visit. I appreciate your ongoing commitment to your health goals. Please review the following plan we discussed and let me know if I can assist you in the future.   Screening recommendations/referrals: Colonoscopy: up to date  Mammogram: No longer needed   Bone Density: Cancer doctor orders  Recommended yearly ophthalmology/optometry visit for glaucoma screening and checkup Recommended yearly dental visit for hygiene and checkup  Vaccinations: Influenza vaccine: up to date  Pneumococcal vaccine: up to date  Tdap vaccine: up to date  Shingles vaccine: postponed     Advanced directives: In place   Conditions/risks identified: Cancer, Hypertension, advanced age, high fall risk   Next appointment: Wellness visit in one year    Preventive Care 18 Years and Older, Female Preventive care refers to lifestyle choices and visits with your health care provider that can promote health and wellness. What does preventive care include?  A yearly physical exam. This is also called an annual well check.  Dental exams once or twice a year.  Routine eye exams. Ask your health care provider how often you should have your eyes checked.  Personal lifestyle choices, including:  Daily care of your teeth and gums.  Regular physical activity.  Eating a healthy diet.  Avoiding tobacco and drug use.  Limiting alcohol use.  Practicing safe sex.  Taking low-dose aspirin every day.  Taking vitamin and mineral supplements as recommended by your health care provider. What happens during an annual well check? The services and screenings done by your health care provider during your annual well check will depend on your age, overall health, lifestyle risk factors, and family history of disease. Counseling  Your health care provider may ask you questions about your:  Alcohol use.  Tobacco use.  Drug use.  Emotional  well-being.  Home and relationship well-being.  Sexual activity.  Eating habits.  History of falls.  Memory and ability to understand (cognition).  Work and work Statistician.  Reproductive health. Screening  You may have the following tests or measurements:  Height, weight, and BMI.  Blood pressure.  Lipid and cholesterol levels. These may be checked every 5 years, or more frequently if you are over 82 years old.  Skin check.  Lung cancer screening. You may have this screening every year starting at age 22 if you have a 30-pack-year history of smoking and currently smoke or have quit within the past 15 years.  Fecal occult blood test (FOBT) of the stool. You may have this test every year starting at age 61.  Flexible sigmoidoscopy or colonoscopy. You may have a sigmoidoscopy every 5 years or a colonoscopy every 10 years starting at age 22.  Hepatitis C blood test.  Hepatitis B blood test.  Sexually transmitted disease (STD) testing.  Diabetes screening. This is done by checking your blood sugar (glucose) after you have not eaten for a while (fasting). You may have this done every 1-3 years.  Bone density scan. This is done to screen for osteoporosis. You may have this done starting at age 24.  Mammogram. This may be done every 1-2 years. Talk to your health care provider about how often you should have regular mammograms. Talk with your health care provider about your test results, treatment options, and if necessary, the need for more tests. Vaccines  Your health care provider may recommend certain vaccines, such as:  Influenza vaccine. This is recommended every year.  Tetanus, diphtheria, and acellular pertussis (Tdap, Td) vaccine. You may need a Td booster every 10 years.  Zoster vaccine. You may need this after age 22.  Pneumococcal 13-valent conjugate (PCV13) vaccine. One dose is recommended after age 4.  Pneumococcal polysaccharide (PPSV23) vaccine. One  dose is recommended after age 45. Talk to your health care provider about which screenings and vaccines you need and how often you need them. This information is not intended to replace advice given to you by your health care provider. Make sure you discuss any questions you have with your health care provider. Document Released: 05/25/2015 Document Revised: 01/16/2016 Document Reviewed: 02/27/2015 Elsevier Interactive Patient Education  2017 [place] Prevention in the Home Falls can cause injuries. They can happen to people of all ages. There are many things you can do to make your home safe and to help prevent falls. What can I do on the outside of my home?  Regularly fix the edges of walkways and driveways and fix any cracks.  Remove anything that might make you trip as you walk through a door, such as a raised step or threshold.  Trim any bushes or trees on the path to your home.  Use bright outdoor lighting.  Clear any walking paths of anything that might make someone trip, such as rocks or tools.  Regularly check to see if handrails are loose or broken. Make sure that both sides of any steps have handrails.  Any raised decks and porches should have guardrails on the edges.  Have any leaves, snow, or ice cleared regularly.  Use sand or salt on walking paths during winter.  Clean up any spills in your garage right away. This includes oil or grease spills. What can I do in the bathroom?  Use night lights.  Install grab bars by the toilet and in the tub and shower. Do not use towel bars as grab bars.  Use non-skid mats or decals in the tub or shower.  If you need to sit down in the shower, use a plastic, non-slip stool.  Keep the floor dry. Clean up any water that spills on the floor as soon as it happens.  Remove soap buildup in the tub or shower regularly.  Attach bath mats securely with double-sided non-slip rug tape.  Do not have throw rugs and other  things on the floor that can make you trip. What can I do in the bedroom?  Use night lights.  Make sure that you have a light by your bed that is easy to reach.  Do not use any sheets or blankets that are too big for your bed. They should not hang down onto the floor.  Have a firm chair that has side arms. You can use this for support while you get dressed.  Do not have throw rugs and other things on the floor that can make you trip. What can I do in the kitchen?  Clean up any spills right away.  Avoid walking on wet floors.  Keep items that you use a lot in easy-to-reach places.  If you need to reach something above you, use a strong step stool that has a grab bar.  Keep electrical cords out of the way.  Do not use floor polish or wax that makes floors slippery. If you must use wax, use non-skid floor wax.  Do not have throw rugs and other things on the floor that can make you trip. What can I do  with my stairs?  Do not leave any items on the stairs.  Make sure that there are handrails on both sides of the stairs and use them. Fix handrails that are broken or loose. Make sure that handrails are as long as the stairways.  Check any carpeting to make sure that it is firmly attached to the stairs. Fix any carpet that is loose or worn.  Avoid having throw rugs at the top or bottom of the stairs. If you do have throw rugs, attach them to the floor with carpet tape.  Make sure that you have a light switch at the top of the stairs and the bottom of the stairs. If you do not have them, ask someone to add them for you. What else can I do to help prevent falls?  Wear shoes that:  Do not have high heels.  Have rubber bottoms.  Are comfortable and fit you well.  Are closed at the toe. Do not wear sandals.  If you use a stepladder:  Make sure that it is fully opened. Do not climb a closed stepladder.  Make sure that both sides of the stepladder are locked into place.  Ask  someone to hold it for you, if possible.  Clearly mark and make sure that you can see:  Any grab bars or handrails.  First and last steps.  Where the edge of each step is.  Use tools that help you move around (mobility aids) if they are needed. These include:  Canes.  Walkers.  Scooters.  Crutches.  Turn on the lights when you go into a dark area. Replace any light bulbs as soon as they burn out.  Set up your furniture so you have a clear path. Avoid moving your furniture around.  If any of your floors are uneven, fix them.  If there are any pets around you, be aware of where they are.  Review your medicines with your doctor. Some medicines can make you feel dizzy. This can increase your chance of falling. Ask your doctor what other things that you can do to help prevent falls. This information is not intended to replace advice given to you by your health care provider. Make sure you discuss any questions you have with your health care provider. Document Released: 02/22/2009 Document Revised: 10/04/2015 Document Reviewed: 06/02/2014 Elsevier Interactive Patient Education  2017 [employer].

## 2018-03-30 NOTE — Progress Notes (Signed)
Subjective:   [name] is a 84 y.o. female who presents for Medicare Annual (Subsequent) preventive examination.  Review of Systems:   Cardiac Risk Factors include: advanced age (>29men, >63 women);smoking/ tobacco exposure;hypertension     Objective:     Vitals: BP 139/64   Pulse (!) 55   Resp 10   Ht 5\' 3"  (1.6 m)   Wt 126 lb (57.2 kg)   SpO2 98%   BMI 22.32 kg/m   Body mass index is 22.32 kg/m.  Advanced Directives 03/26/2018 03/24/2018 03/10/2018 02/26/2018 02/24/2018 02/12/2018 02/10/2018  Does Patient Have a Medical Advance Directive? Yes Yes Yes Yes Yes Yes No  Type of Arts administrator Power of [place]  Does patient want to make changes to medical advance directive? No - Patient declined No - Patient declined No - Patient declined No - Patient declined No - Patient declined No - Patient declined No - Patient declined  Copy of [place] in Chart? No - copy requested No - copy requested No - copy requested No - copy requested No - copy requested No - copy requested No - copy requested  Would patient like information on creating a medical advance directive? No - Patient declined No - Patient declined No - Patient declined No - Patient declined No - Patient declined No - Patient declined No - Patient declined  Pre-existing out of facility DNR order (yellow form or pink MOST form) - - - - - - -    Tobacco Social History   Tobacco Use  Smoking Status Current Some Day Smoker  . Packs/day: 0.50  . Years: 60.00  . Pack years: 30.00  . Types: Cigarettes  . Last attempt to quit: 09/09/2016  . Years since quitting: 1.5  Smokeless Tobacco Never Used     Ready to quit: No Counseling given: Yes   Clinical Intake:  Pre-visit preparation completed: Yes  Pain :  No/denies pain Pain Score: 0-No pain     BMI - recorded: 22.3 Nutritional Status: BMI of 19-24  Normal Nutritional Risks: None Diabetes: No  How often do you need to have someone help you when you read instructions, pamphlets, or other written materials from your doctor or pharmacy?: 2 - Rarely What is the last grade level you completed in school?: 12 grade   Interpreter Needed?: No  Information entered by :: Francena Hanly LPN   Past Medical History:  Diagnosis Date  . Anemia due to antineoplastic chemotherapy 09/12/2015   Started Aranesp 500 mcg on 09/12/2015  . Anxiety   . Chronic diarrhea   . Depression   . DNR (do not resuscitate) 08/16/2015  . Erosive esophagitis   . GERD (gastroesophageal reflux disease)   . Hx of adenomatous colonic polyps    tubular adenomas, last found in 2008  . Hyperplastic colon polyp 03/19/10   tcs by Dr. Gala Romney  . Hypertension 20 years   . Kidney stone    hx/ crushed   . Opioid contract exists 04/18/2015   With Dr. Moshe Cipro  . Pancreatic cancer ([place]) 02/2014  . Pancreatic cancer metastasized to liver ([place]) 03/10/2014  . Schatzki's ring 08/26/10   Last dilated on EGD by Dr. Trevor Iha [facility], linear gastric erosions, BI hemigastrectomy   Past Surgical History:  Procedure Laterality Date  . BREAST LUMPECTOMY Left   . bunion  removal     from both feet   . CHOLECYSTECTOMY  1965   . COLONOSCOPY  03/19/2010   DR Gala Romney,, normal TI, pancolonic diverticula, random colon bx neg., hyperplastic polyps removed  . ESOPHAGOGASTRODUODENOSCOPY  11/22/2003   DR Gala Romney, erosive RE, Billroth I  . ESOPHAGOGASTRODUODENOSCOPY  08/26/10   Dr. Gala Romney- moderate severe ERE, Scahtzki ring s/p dilation, Billroth I, linear gastric erosions, bx-gastric xanthelasma  . LEFT SHOULDER SURGERY  2009   DR HARRISON  . ORIF ANKLE FRACTURE Right 05/22/2013   Procedure: OPEN REDUCTION INTERNAL FIXATION (ORIF) RIGHT ANKLE FRACTURE;  Surgeon: Sanjuana Kava, MD;  Location: [facility];  Service:  Orthopedics;  Laterality: Right;  . stomach ulcer  50 years ago    had some of her stomach removed    Family History  Problem Relation Age of Onset  . Hypertension Mother   . Kidney failure Brother        on dialysis  . Diabetes Sister   . Diabetes Sister   . Hypertension Father   . Kidney failure Sister   . Kidney Stones Brother   . Breast cancer Son   . Gout Son   . Liver disease Neg Hx   . Colon cancer Neg Hx    Social History   Socioeconomic History  . Marital status: Widowed    Spouse name: Not on file  . Number of children: 2  . Years of education: 19  . Highest education level: 12th grade  Occupational History  . Occupation: retired from Estate manager/land agent: RETIRED  Social Needs  . Financial resource strain: Not hard at all  . Food insecurity:    Worry: Never true    Inability: Never true  . Transportation needs:    Medical: No    Non-medical: No  Tobacco Use  . Smoking status: Current Some Day Smoker    Packs/day: 0.50    Years: 60.00    Pack years: 30.00    Types: Cigarettes    Last attempt to quit: 09/09/2016    Years since quitting: 1.5  . Smokeless tobacco: Never Used  Substance and Sexual Activity  . Alcohol use: No  . Drug use: No  . Sexual activity: Not Currently  Lifestyle  . Physical activity:    Days per week: 3 days    Minutes per session: 30 min  . Stress: Not at all  Relationships  . Social connections:    Talks on phone: More than three times a week    Gets together: Three times a week    Attends religious service: 1 to 4 times per year    Active member of club or organization: No    Attends meetings of clubs or organizations: Never    Relationship status: Widowed  Other Topics Concern  . Not on file  Social History Narrative   Lives with son alone     Outpatient Encounter Medications as of 03/30/2018  Medication Sig  . acetaminophen (TYLENOL) 500 MG tablet Take 500 mg by mouth every 6 (six) hours as needed for mild pain or  moderate pain.   Marland Kitchen amLODipine (NORVASC) 10 MG tablet TAKE 1 TABLET BY MOUTH EVERY DAY  . clonazePAM (KLONOPIN) 0.5 MG tablet TAKE 1 TABLET BY MOUTH EVERY DAY AS NEEDED FOR ANXIETY  . cycloSPORINE (RESTASIS) 0.05 % ophthalmic emulsion Place 1 drop into both eyes 2 (two) times daily.  . diphenhydrAMINE (BENADRYL) 50 MG tablet Take 1 tablet 1 hour prior to  CT  . fluorometholone (FML) 0.1 % ophthalmic suspension INSTILL 1 DROP INTO EACH EYE THREE TIMES DAILY FOR 14 DAYS  . lidocaine (XYLOCAINE) 2 % solution RINSE WITH 5ML AS NEEDED TO RELIEVE MOUTH PAIN.  . metoprolol tartrate (LOPRESSOR) 25 MG tablet Take one tablet once daily for blood pressure  . mirabegron ER (MYRBETRIQ) 25 MG TB24 tablet Take 1 tablet (25 mg total) by mouth daily.  . pantoprazole (PROTONIX) 20 MG tablet TAKE 1 TABLET(20 MG) BY MOUTH DAILY  . potassium chloride (K-DUR) 10 MEQ tablet Take 1 tablet (10 mEq total) by mouth 2 (two) times daily.  . traMADol (ULTRAM) 50 MG tablet One tablet once daily, as needed, for uncontrolled pain  . [DISCONTINUED] prochlorperazine (COMPAZINE) 10 MG tablet Take 1 tablet (10 mg total) by mouth every 6 (six) hours as needed (Nausea or vomiting).   Facility-Administered Encounter Medications as of 03/30/2018  Medication  . dexamethasone (DECADRON) 10 mg in sodium chloride 0.9 % 50 mL IVPB  . sodium chloride flush (NS) 0.9 % injection 10 mL    Activities of Daily Living In your present state of health, do you have any difficulty performing the following activities: 03/30/2018  Hearing? N  Vision? Y  Difficulty concentrating or making decisions? N  Walking or climbing stairs? N  Dressing or bathing? N  Doing errands, shopping? N  Preparing Food and eating ? N  Using the Toilet? N  In the past six months, have you accidently leaked urine? Y  Do you have problems with loss of bowel control? N  Managing your Medications? N  Managing your Finances? Y  Housekeeping or managing your  Housekeeping? N  Some recent data might be hidden    Patient Care Team: Fayrene Helper, MD as PCP - General [place], Kelby Fam, MD (Inactive) as Consulting Physician (Hematology and Oncology) Madelin Headings, DO (Optometry) Sheldon Silvan, Scharlene Corn as Physician Assistant (Oncology) Holley Bouche, NP (Inactive) as Nurse Practitioner (Nurse Practitioner)    Assessment:   This is a routine wellness examination for [name].  Exercise Activities and Dietary recommendations Current Exercise Habits: Home exercise routine, Type of exercise: stretching;walking, Time (Minutes): 30, Frequency (Times/Week): 3, Weekly Exercise (Minutes/Week): 90, Intensity: Mild  Goals    . Gain weight    . Increase water intake       Fall Risk Fall Risk  03/30/2018 03/30/2018 12/28/2017 10/28/2017 01/08/2017  Falls in the past year? 0 0 No No Yes  Number falls in past yr: - - - - 1  Injury with Fall? - - - - Yes  Risk Factor Category  - - - - High Fall Risk  Risk for fall due to : - - - - History of fall(s)  Follow up - - - - Follow up appointment   Is the patient's home free of loose throw rugs in walkways, pet beds, electrical cords, etc?   yes      Grab bars in the bathroom? no      Handrails on the stairs?   yes      Adequate lighting?   yes  Timed Get Up and Go performed: Patient able to perform in 7 seconds without assistance   Depression Screen PHQ 2/9 Scores 03/30/2018 03/30/2018 03/30/2018 12/28/2017  PHQ - 2 Score 0 0 0 4  PHQ- 9 Score 0 2 2 10      Cognitive Function MMSE - Mini Mental State Exam 05/01/2016  Orientation to time 5  Orientation to Place  5  Registration 3  Attention/ Calculation 5  Recall 0  Language- name 2 objects 2  Language- repeat 1  Language- follow 3 step command 3  Language- read & follow direction 1  Write a sentence 1  Copy design 1  Total score 27     6CIT Screen 03/30/2018 09/24/2016  What Year? 0 points 0 points  What month? 0 points 0 points    What time? 0 points 0 points  Count back from 20 0 points 0 points  Months in reverse 2 points 0 points  Repeat phrase 0 points 0 points  Total Score 2 0    Immunization History  Administered Date(s) Administered  . Influenza Nasal 02/08/2014  . Influenza Split 03/13/2011, 02/03/2012  . Influenza Whole 02/13/2010  . Influenza, High Dose Seasonal PF 02/10/2018  . Influenza,inj,Quad PF,6+ Mos 02/04/2013, 02/21/2015, 01/30/2016, 02/04/2017  . Pneumococcal Conjugate-13 03/10/2016  . Pneumococcal Polysaccharide-23 01/07/2010, 05/23/2013  . Td 01/07/2010    Qualifies for Shingles Vaccine?postponed   Screening Tests Health Maintenance  Topic Date Due  . TETANUS/TDAP  01/08/2020  . INFLUENZA VACCINE  Completed  . DEXA SCAN  Completed  . PNA vac Low Risk Adult  Completed    Cancer Screenings: Lung: Low Dose CT Chest recommended if Age 32-80 years, 30 pack-year currently smoking OR have quit w/in 15years. Patient does qualify. Breast:  Up to date on Mammogram? Yes   Up to date of Bone Density/Dexa? No Colorectal: up to date   Additional Screenings: : Hepatitis C Screening: N/A      Plan:   Continue to try and quit smoking, increase water intake and gain some weight   I have personally reviewed and noted the following in the patient's chart:   . Medical and social history . Use of alcohol, tobacco or illicit drugs  . Current medications and supplements . Functional ability and status . Nutritional status . Physical activity . Advanced directives . List of other physicians . Hospitalizations, surgeries, and ER visits in previous 12 months . Vitals . Screenings to include cognitive, depression, and falls . Referrals and appointments  In addition, I have reviewed and discussed with patient certain preventive protocols, quality metrics, and best practice recommendations. A written personalized care plan for preventive services as well as general preventive health  recommendations were provided to patient.     Hayden Pedro, LPN  89/37/3428

## 2018-04-01 ENCOUNTER — Other Ambulatory Visit: Payer: Self-pay | Admitting: Family Medicine

## 2018-04-01 MED ORDER — CLONAZEPAM 0.5 MG PO TABS: ORAL_TABLET | ORAL | 5 refills | Status: DC

## 2018-04-04 ENCOUNTER — Encounter: Payer: Self-pay | Admitting: Family Medicine

## 2018-04-04 DIAGNOSIS — R32 Unspecified urinary incontinence: Secondary | ICD-10-CM | POA: Insufficient documentation

## 2018-04-04 NOTE — Assessment & Plan Note (Signed)
Improved with remeron, continue same as well as klonopin as before

## 2018-04-04 NOTE — Progress Notes (Signed)
   [name]     MRN: [id number]      DOB: 1929-03-26   HPI [name] is here for follow up and re-evaluation of chronic medical conditions, medication management and review of any available recent lab and radiology data.  Preventive health is updated, specifically  Cancer screening and Immunization.   Questions or concerns regarding consultations or procedures which the PT has had in the interim are  Addressed.Recently treated by ENT for tinuitus The PT denies any adverse reactions to current medications since the last visit.  There are no new concerns.  There are no specific complaints   ROS Denies recent fever or chills. Denies sinus pressure, nasal congestion, ear pain or sore throat. Denies chest congestion, productive cough or wheezing. Denies chest pains, palpitations and leg swelling Denies abdominal pain, nausea, vomiting,diarrhea or constipation.   Denies dysuria, frequency, hesitancy c/o uncontrolled urinary incontinence.Drinks a lot of caffeing daily Denies uncontrolled  joint pain, swelling and limitation in mobility. Denies headaches, seizures, numbness, or tingling. Denies depression, anxiety or insomnia. Denies skin break down or rash.   PE  BP 140/70   Pulse 88   Resp 16   Ht 5\' 3"  (1.6 m)   Wt 127 lb (57.6 kg)   SpO2 97%   BMI 22.50 kg/m   Patient alert and oriented and in no cardiopulmonary distress.  HEENT: No facial asymmetry, EOMI,   oropharynx pink and moist.  Neck supple no JVD, no mass.  Chest: Clear to auscultation bilaterally.  CVS: S1, S2 no murmurs, no S3.Regular rate.  ABD: Soft non tender.   Ext: No edema  MS: Adequate though reduced  ROM spine, shoulders, hips and knees.  Skin: Intact, no ulcerations or rash noted.  Psych: Good eye contact, normal affect. Memory intact not anxious or depressed appearing.  CNS: CN 2-12 intact, power,  normal throughout.no focal deficits noted.   Assessment & Plan Essential  hypertension Controlled, no change in medication DASH diet and commitment to daily physical activity for a minimum of 30 minutes discussed and encouraged, as a part of hypertension management. The importance of attaining a healthy weight is also discussed.  BP/Weight 03/30/2018 03/30/2018 03/26/2018 03/24/2018 03/12/2018 03/10/2018 92/92/4462  Systolic BP 863 817 711 657 903 833 383  Diastolic BP 64 70 51 63 60 57 51  Wt. (Lbs) 126 127 - 126.6 - - 128.8  BMI 22.32 22.5 - 22.43 - - 22.82       Nicotine dependence Asked:confirms currently smokes 3 to 5 cigarettes daily Assess: Unwilling to quit but cutting back Advise: needs to QUIT to reduce risk of cancer, already being treated aggressively for pancreatic cancer, cardio and cerebrovascular disease Assist: counseled for 5 minutes and literature provided Arrange: follow up in 3 months   Insomnia due to anxiety and fear Improved with remeron, continue same as well as klonopin as before  Depression Controlled, no change in medication   Anxiety Controlled, no change in medication   Urinary incontinence Uncontrolled , needs to cut back on caffeine, and also discussed regula pelvic strengthening exercisise

## 2018-04-04 NOTE — Assessment & Plan Note (Signed)
Controlled, no change in medication DASH diet and commitment to daily physical activity for a minimum of 30 minutes discussed and encouraged, as a part of hypertension management. The importance of attaining a healthy weight is also discussed.  BP/Weight 03/30/2018 03/30/2018 03/26/2018 03/24/2018 03/12/2018 03/10/2018 40/76/8088  Systolic BP 110 315 945 859 292 446 286  Diastolic BP 64 70 51 63 60 57 51  Wt. (Lbs) 126 127 - 126.6 - - 128.8  BMI 22.32 22.5 - 22.43 - - 22.82

## 2018-04-04 NOTE — Assessment & Plan Note (Signed)
Uncontrolled , needs to cut back on caffeine, and also discussed regula pelvic strengthening exercisise

## 2018-04-04 NOTE — Assessment & Plan Note (Signed)
Controlled, no change in medication  

## 2018-04-04 NOTE — Assessment & Plan Note (Signed)
Asked:confirms currently smokes 3 to 5 cigarettes daily Assess: Unwilling to quit but cutting back Advise: needs to QUIT to reduce risk of cancer, already being treated aggressively for pancreatic cancer, cardio and cerebrovascular disease Assist: counseled for 5 minutes and literature provided Arrange: follow up in 3 months

## 2018-04-12 ENCOUNTER — Ambulatory Visit (HOSPITAL_COMMUNITY): Payer: Medicare HMO

## 2018-04-12 ENCOUNTER — Other Ambulatory Visit (HOSPITAL_COMMUNITY): Payer: Medicare HMO

## 2018-04-13 ENCOUNTER — Other Ambulatory Visit (HOSPITAL_COMMUNITY): Payer: Self-pay | Admitting: Nurse Practitioner

## 2018-04-13 DIAGNOSIS — C259 Malignant neoplasm of pancreas, unspecified: Secondary | ICD-10-CM

## 2018-04-13 DIAGNOSIS — C787 Secondary malignant neoplasm of liver and intrahepatic bile duct: Principal | ICD-10-CM

## 2018-04-14 ENCOUNTER — Encounter (HOSPITAL_COMMUNITY): Payer: Medicare HMO

## 2018-04-16 ENCOUNTER — Other Ambulatory Visit (HOSPITAL_COMMUNITY): Payer: Self-pay | Admitting: Nurse Practitioner

## 2018-04-16 ENCOUNTER — Ambulatory Visit (HOSPITAL_COMMUNITY)
Admission: RE | Admit: 2018-04-16 | Discharge: 2018-04-16 | Disposition: A | Discharge status: Home / Self Care | Payer: Medicare HMO | Source: Ambulatory Visit | Attending: Nurse Practitioner | Admitting: Nurse Practitioner

## 2018-04-16 DIAGNOSIS — C787 Secondary malignant neoplasm of liver and intrahepatic bile duct: Secondary | ICD-10-CM | POA: Diagnosis present

## 2018-04-16 DIAGNOSIS — C259 Malignant neoplasm of pancreas, unspecified: Secondary | ICD-10-CM

## 2018-04-16 DIAGNOSIS — Z5111 Encounter for antineoplastic chemotherapy: Secondary | ICD-10-CM | POA: Diagnosis not present

## 2018-04-20 ENCOUNTER — Other Ambulatory Visit: Payer: Self-pay

## 2018-04-20 ENCOUNTER — Inpatient Hospital Stay (HOSPITAL_COMMUNITY): Payer: Medicare HMO | Attending: Hematology | Admitting: Hematology

## 2018-04-20 ENCOUNTER — Encounter (HOSPITAL_COMMUNITY): Payer: Self-pay | Admitting: Hematology

## 2018-04-20 DIAGNOSIS — R5383 Other fatigue: Secondary | ICD-10-CM | POA: Diagnosis not present

## 2018-04-20 DIAGNOSIS — R634 Abnormal weight loss: Secondary | ICD-10-CM | POA: Insufficient documentation

## 2018-04-20 DIAGNOSIS — Z5111 Encounter for antineoplastic chemotherapy: Secondary | ICD-10-CM | POA: Insufficient documentation

## 2018-04-20 DIAGNOSIS — I10 Essential (primary) hypertension: Secondary | ICD-10-CM | POA: Diagnosis not present

## 2018-04-20 DIAGNOSIS — F17210 Nicotine dependence, cigarettes, uncomplicated: Secondary | ICD-10-CM | POA: Diagnosis not present

## 2018-04-20 DIAGNOSIS — R69 Illness, unspecified: Secondary | ICD-10-CM | POA: Diagnosis not present

## 2018-04-20 DIAGNOSIS — C259 Malignant neoplasm of pancreas, unspecified: Secondary | ICD-10-CM

## 2018-04-20 DIAGNOSIS — C787 Secondary malignant neoplasm of liver and intrahepatic bile duct: Secondary | ICD-10-CM | POA: Insufficient documentation

## 2018-04-20 DIAGNOSIS — C252 Malignant neoplasm of tail of pancreas: Secondary | ICD-10-CM | POA: Diagnosis not present

## 2018-04-20 MED ORDER — CLONAZEPAM 0.5 MG PO TABS: ORAL_TABLET | ORAL | 0 refills | Status: DC

## 2018-04-20 NOTE — Assessment & Plan Note (Signed)
1.  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 28 days started on 01/28/2017 -CT scan done on 07/13/2017 shows stable liver disease. CA 19-9 was on 06/24/2017 at 129.  CA-19-9 at last visit went up to 171. - I discussed the latest CT scan reports which showed progression in the liver lesions which are very subtle.  There is progression in the tail of the pancreas lesion measuring 27 x 27 mm.  Even though the progression is very small, her CA 19-9 is continuing to go up, last one at 171.  Hence I have recommended a change in her treatment.  Gemcitabine and Tarceva were discontinued. - First cycle of infusional 5-FU and liposomal Irinotecan started on 10/21/2017, Onivyde started at a decreased dose of 50 mg/m.  The dose of infusional 5-FU was reduced by 20% after cycle 1 secondary to mucositis. - Cycle 6 chemotherapy was on 12/30/2017.  She is tolerating it very well.  We did reviewed the results of CEA which has come down to 206 on 12/30/2017.  This was 316 in June. - CT CAP on 01/04/2018 without contrast shows stable mass in the pancreatic tail and less distinctively seen hepatic metastasis.  - She has completed cycle 12 on 03/24/2018.  She is tolerating it very well except some minor side effects including hyperpigmentation of the skin. - She is continuing to get response with her tumor marker trending down from 316 prior to start of therapy to 110 on 03/10/2018. -I reviewed the results of the CT scan of the chest, abdomen and pelvis done without contrast.  There is a suggestion of a new lesion in segment 4 of the liver measuring 1.3 cm.  I compared it with the prior scan.  There is a vague area in the prior scan.  Pancreatic tail lesion is stable.  As her tumor markers are  coming down, and this is a suboptimal scan I have recommended continuing current therapy and rescanning her in 2 to 3 months. -She is agreeable to this plan.  She would like to start next week.  2.  Weight loss: She will continue Marinol 2.5 mg twice daily.  Her weight has been stable.  3.  Genetic testing: -We have made a consultation with geneticist for BRCA1/2 testing.  We will also consider MSI testing.

## 2018-04-20 NOTE — Progress Notes (Signed)
[place], [place] [address]   CLINIC:  Medical Oncology/Hematology  PCP:  Fayrene Helper, MD [address] [place] [address] [phone]   REASON FOR VISIT: Follow-up for pancreatic cancer to the liver  CURRENT THERAPY: Infusional 5FU and liposomal Irinotecan  BRIEF ONCOLOGIC HISTORY:    Pancreatic cancer metastasized to liver ([place])   03/10/2014 Initial Diagnosis    Pancreatic cancer metastasized to liver    03/22/2014 - 03/05/2016 Chemotherapy    Abraxane/Gemzar days 1, 8, every 28 days.  Day 15 was cancelled due to leukopenia and thrombocytopenia on day 15 cycle 1.    05/24/2014 Treatment Plan Change    Day 8 of cycle 3 is held with ANC of 1.1    06/05/2014 Imaging    CT C/A/P . Interval decrease in size of the pancreatic tail mass.Improved hepatic metastatic disease. No new lesions. No CT findings for metastatic disease involving the chest.    08/09/2014 Tumor Marker    CA 19-9= 33 (WNL)    10/26/2014 Imaging    MRI- Continued interval decrease in size of the hepatic metastatic lesions and no new lesions are identified. Continued decrease in size of the pancreatic tail lesion.    01/03/2015 Tumor Marker    Results for JACQUELIN, KRAJEWSKI (MRN [id number]) as of 01/18/2015 12:52  01/03/2015 10:00 CA 19-9: 14     01/17/2015 Imaging    MRI- Response to therapy of hepatic metastasis.  Similar size of a pancreatic tail lesion.    03/20/2015 Imaging    MRI-L spine- Severe disc space narrowing at L2-L3, with endplate reactive changes. Large disc extrusion into the ventral epidural space,central to the RIGHT with a cephalad migrated free fragment. Additional Large disc extrusion into the retroperitone...    03/22/2015 Imaging    CT pelvis- No evidence for metastatic disease within the pelvis.    07/24/2015 Imaging    MRI abd- Continued response to therapy, with no residual detectable liver metastases. No new sites of  metastatic disease in the abdomen.     08/15/2015 Code Status     She confirms desire for DNR status.    12/21/2015 Imaging    MRI liver- Severe image degradation due to motion artifact reducing diagnostic sensitivity and specificity. Reduce conspicuity of the pancreatic tail lesion suggesting further improvement. The original liver lesions have resolved.    03/05/2016 Treatment Plan Change    Chemotherapy holiday/Break after 2 years worth of treatment    04/07/2016 Imaging    CT chest- When compared to recent chest CT, new minimally displaced anterior left sixth rib fracture. Slight increase in subcarinal adenopathy    04/28/2016 Imaging    Bone density- BMD as determined from Femur Neck Right is 0.705 g/cm2 with a T-Score of -2.4. This patient is considered osteopenic according to [facility]) criteria. Compared with the prior study on 02/23/2013, the BMD of the lumbar spine/rt. femoral neck show a statistically significant decrease.     05/23/2016 Imaging    MRI abd- 1. Exam is significantly degraded by patient respiratory motion. Consider follow-up exams with CT abdomen with without contrast per pancreatic protocol. 2. Fullness in tail of pancreas is more prominent and potentially increased in size. Recommend close attention on follow-up. (Consider CT as above) 3. No explanation for back pain.    09/11/2016 Imaging    MRI pancreas- Interval progression of pancreatic tail lesion. New differential perfusion right liver with areas  of heterogeneity. Underlying metastatic disease in this region not excluded.    09/11/2016 Progression    MRi in conjunction with rising CA 19-9 are indicative of relapse of disease.    01/09/2017 Progression    CT C/A/P: 2.1 x 2.9 cm lesion in the pancreatic tail and abutting the splenic hilum, corresponding to known primary pancreatic neoplasm, mildly increased.  Scattered small hypoenhancing lesions in the liver measuring up  to 10 mm, approximately 8-10 in number, suspicious for hepatic metastases.  No findings specific for metastatic disease in the chest.  Additional ancillary findings as above.     01/27/2017 - 10/20/2017 Chemotherapy    The patient had gemcitabine (GEMZAR) 1,292 mg in sodium chloride 0.9 % 250 mL chemo infusion, 800 mg/m2 = 1,292 mg, Intravenous,  Once, 9 of 12 cycles Administration: 1,292 mg (01/28/2017), 1,292 mg (02/11/2017), 1,292 mg (02/25/2017), 1,292 mg (03/11/2017), 1,292 mg (03/25/2017), 1,292 mg (04/08/2017), 1,292 mg (04/22/2017), 1,292 mg (04/29/2017), 1,292 mg (05/20/2017), 1,292 mg (06/03/2017), 1,292 mg (06/24/2017), 1,292 mg (07/08/2017), 1,292 mg (07/29/2017), 1,292 mg (08/12/2017), 1,292 mg (08/26/2017), 1,292 mg (09/09/2017), 1,292 mg (09/23/2017), 1,292 mg (10/07/2017)  for chemotherapy treatment.     10/20/2017 -  Chemotherapy    The patient had leucovorin 700 mg in dextrose 5 % 250 mL infusion, 644 mg, Intravenous,  Once, 12 of 13 cycles Administration: 700 mg (10/21/2017), 700 mg (11/04/2017), 700 mg (11/18/2017), 700 mg (12/02/2017), 700 mg (12/16/2017), 700 mg (12/30/2017), 700 mg (01/13/2018), 700 mg (01/27/2018), 700 mg (02/10/2018), 700 mg (02/24/2018), 700 mg (03/10/2018), 700 mg (03/24/2018) ondansetron (ZOFRAN) 8 mg in sodium chloride 0.9 % 50 mL IVPB, , Intravenous,  Once, 11 of 12 cycles Administration:  (11/04/2017),  (01/27/2018),  (02/10/2018),  (02/24/2018),  (03/10/2018),  (03/24/2018) fluorouracil (ADRUCIL) 3,850 mg in sodium chloride 0.9 % 73 mL chemo infusion, 2,400 mg/m2 = 3,850 mg, Intravenous, 1 Day/Dose, 12 of 13 cycles Dose modification: 1,920 mg/m2 (original dose 2,400 mg/m2, Cycle 2, Reason: Provider Judgment, Comment: mucisitis) Administration: 3,850 mg (10/21/2017), 3,100 mg (11/04/2017), 3,100 mg (11/18/2017), 3,100 mg (12/02/2017), 3,100 mg (12/16/2017), 3,100 mg (12/30/2017), 3,100 mg (01/13/2018), 3,100 mg (01/27/2018), 3,100 mg (02/10/2018), 3,100 mg (02/24/2018), 3,100 mg  (03/10/2018), 3,100 mg (03/24/2018) irinotecan LIPOSOME (ONIVYDE) 81.7 mg in sodium chloride 0.9 % 500 mL chemo infusion, 50 mg/m2 = 81.7 mg (100 % of original dose 50 mg/m2), Intravenous, Once, 12 of 13 cycles Dose modification: 50 mg/m2 (original dose 50 mg/m2, Cycle 1, Reason: Provider Judgment), 50 mg/m2 (original dose 50 mg/m2, Cycle 2, Reason: Provider Judgment) Administration: 81.7 mg (10/21/2017), 81.7 mg (11/04/2017), 81.7 mg (11/18/2017), 81.7 mg (12/02/2017), 81.7 mg (12/16/2017), 81.7 mg (12/30/2017), 81.7 mg (01/13/2018), 81.7 mg (01/27/2018), 81.7 mg (02/10/2018), 81.7 mg (02/24/2018), 81.7 mg (03/10/2018), 81.7 mg (03/24/2018)  for chemotherapy treatment.       CANCER STAGING: Cancer Staging Pancreatic cancer metastasized to liver [facility]) Staging form: Pancreas, AJCC 7th Edition - Clinical: Stage IV (T3, N1, M1) - Signed by Baird Cancer, PA-C on 03/16/2014 - Pathologic: No stage assigned - Unsigned    INTERVAL HISTORY:  Ms. Kamm 84 y.o. female returns for routine follow-up for pancreatic cancer to the liver. She is here today alone. Her son had to go home to [place]. She is fatigued all the time now. Small tasks will make her tired and she has to rest frequently. She denies any nausea, vomiting, or diarrhea. Denies any new pains. Denies any headaches. Denies any fever or recent infections. She reports her appetite is  75% and her energy level is 25%. She lives at her home alone and her son will be back to help her in 1 month.      REVIEW OF SYSTEMS:  Review of Systems  Constitutional: Positive for fatigue.     PAST MEDICAL/SURGICAL HISTORY:  Past Medical History:  Diagnosis Date  . Anemia due to antineoplastic chemotherapy 09/12/2015   Started Aranesp 500 mcg on 09/12/2015  . Anxiety   . Chronic diarrhea   . Depression   . DNR (do not resuscitate) 08/16/2015  . Erosive esophagitis   . GERD (gastroesophageal reflux disease)   . Hx of adenomatous colonic polyps    tubular  adenomas, last found in 2008  . Hyperplastic colon polyp 03/19/10   tcs by Dr. Gala Romney  . Hypertension 20 years   . Kidney stone    hx/ crushed   . Opioid contract exists 04/18/2015   With Dr. Moshe Cipro  . Pancreatic cancer ([place]) 02/2014  . Pancreatic cancer metastasized to liver ([place]) 03/10/2014  . Schatzki's ring 08/26/10   Last dilated on EGD by Dr. Trevor Iha [facility], linear gastric erosions, BI hemigastrectomy   Past Surgical History:  Procedure Laterality Date  . BREAST LUMPECTOMY Left   . bunion removal     from both feet   . CHOLECYSTECTOMY  1965   . COLONOSCOPY  03/19/2010   DR Gala Romney,, normal TI, pancolonic diverticula, random colon bx neg., hyperplastic polyps removed  . ESOPHAGOGASTRODUODENOSCOPY  11/22/2003   DR Gala Romney, erosive RE, Billroth I  . ESOPHAGOGASTRODUODENOSCOPY  08/26/10   Dr. Gala Romney- moderate severe ERE, Scahtzki ring s/p dilation, Billroth I, linear gastric erosions, bx-gastric xanthelasma  . LEFT SHOULDER SURGERY  2009   DR HARRISON  . ORIF ANKLE FRACTURE Right 05/22/2013   Procedure: OPEN REDUCTION INTERNAL FIXATION (ORIF) RIGHT ANKLE FRACTURE;  Surgeon: Sanjuana Kava, MD;  Location: [facility];  Service: Orthopedics;  Laterality: Right;  . stomach ulcer  50 years ago    had some of her stomach removed      SOCIAL HISTORY:  Social History   Socioeconomic History  . Marital status: Widowed    Spouse name: Not on file  . Number of children: 2  . Years of education: 64  . Highest education level: 12th grade  Occupational History  . Occupation: retired from Estate manager/land agent: RETIRED  Social Needs  . Financial resource strain: Not hard at all  . Food insecurity:    Worry: Never true    Inability: Never true  . Transportation needs:    Medical: No    Non-medical: No  Tobacco Use  . Smoking status: Current Some Day Smoker    Packs/day: 0.50    Years: 60.00    Pack years: 30.00    Types: Cigarettes    Last attempt to quit: 09/09/2016    Years since  quitting: 1.6  . Smokeless tobacco: Never Used  Substance and Sexual Activity  . Alcohol use: No  . Drug use: No  . Sexual activity: Not Currently  Lifestyle  . Physical activity:    Days per week: 3 days    Minutes per session: 30 min  . Stress: Not at all  Relationships  . Social connections:    Talks on phone: More than three times a week    Gets together: Three times a week    Attends religious service: 1 to 4 times per year    Active member of club or organization: No  Attends meetings of clubs or organizations: Never    Relationship status: Widowed  . Intimate partner violence:    Fear of current or ex partner: No    Emotionally abused: No    Physically abused: No    Forced sexual activity: No  Other Topics Concern  . Not on file  Social History Narrative   Lives with son alone     FAMILY HISTORY:  Family History  Problem Relation Age of Onset  . Hypertension Mother   . Kidney failure Brother        on dialysis  . Diabetes Sister   . Diabetes Sister   . Hypertension Father   . Kidney failure Sister   . Kidney Stones Brother   . Breast cancer Son   . Gout Son   . Liver disease Neg Hx   . Colon cancer Neg Hx     CURRENT MEDICATIONS:  Outpatient Encounter Medications as of 04/20/2018  Medication Sig  . acetaminophen (TYLENOL) 500 MG tablet Take 500 mg by mouth every 6 (six) hours as needed for mild pain or moderate pain.   Marland Kitchen amLODipine (NORVASC) 10 MG tablet TAKE 1 TABLET BY MOUTH EVERY DAY  . clonazePAM (KLONOPIN) 0.5 MG tablet Take one tablet daily for anxiety  . cyclobenzaprine (FLEXERIL) 5 MG tablet TK 1 T PO QHS FOR 7 DAYS THEN PRF PAIN  . cycloSPORINE (RESTASIS) 0.05 % ophthalmic emulsion Place 1 drop into both eyes 2 (two) times daily.  . diphenhydrAMINE (BENADRYL) 50 MG tablet Take 1 tablet 1 hour prior to CT  . fluorometholone (FML) 0.1 % ophthalmic suspension INSTILL 1 DROP INTO EACH EYE THREE TIMES DAILY FOR 14 DAYS  . lidocaine (XYLOCAINE) 2 %  solution RINSE WITH 5ML AS NEEDED TO RELIEVE MOUTH PAIN.  . metoprolol tartrate (LOPRESSOR) 25 MG tablet Take one tablet once daily for blood pressure  . mirabegron ER (MYRBETRIQ) 25 MG TB24 tablet Take 1 tablet (25 mg total) by mouth daily.  . pantoprazole (PROTONIX) 20 MG tablet TAKE 1 TABLET(20 MG) BY MOUTH DAILY  . potassium chloride (K-DUR) 10 MEQ tablet Take 1 tablet (10 mEq total) by mouth 2 (two) times daily.  . traMADol (ULTRAM) 50 MG tablet One tablet once daily, as needed, for uncontrolled pain  . [DISCONTINUED] clonazePAM (KLONOPIN) 0.5 MG tablet Take one tablet daily for anxiety  . [DISCONTINUED] prochlorperazine (COMPAZINE) 10 MG tablet Take 1 tablet (10 mg total) by mouth every 6 (six) hours as needed (Nausea or vomiting).   Facility-Administered Encounter Medications as of 04/20/2018  Medication  . dexamethasone (DECADRON) 10 mg in sodium chloride 0.9 % 50 mL IVPB  . sodium chloride flush (NS) 0.9 % injection 10 mL    ALLERGIES:  Allergies  Allergen Reactions  . Iohexol Hives and Shortness Of Breath    **Patient can not have IV contrast, pt has breakthrough reaction even after 13 hour premeds** Do not give IV contrast PATIENT HAD TO BE TAKEN TO ED, C/O WHELPS, HIVES, DIFFICULTY BREATHING. PATIENT GIVEN IV CONTRAST AFTER 13 HOUR PRE MEDS ON 01/09/2017 WITH NO REACTION NOTED. On 10/19/17 patient had reaction despite premedication.    Marland Kitchen Aciphex [Rabeprazole Sodium] Other (See Comments)    unknown  . Amlodipine Besylate-Valsartan     Rash to high-dose (5/320)  . Esomeprazole Magnesium   . Omeprazole Other (See Comments)    Patient states "medication didn't work"  . Ciprofloxacin Rash  . Penicillins Swelling and Rash    Has patient had  a PCN reaction causing immediate rash, facial/tongue/throat swelling, SOB or lightheadedness with hypotension: Yes Has patient had a PCN reaction causing severe rash involving mucus membranes or skin necrosis: Yes Has patient had a PCN  reaction that required hospitalization: No Has patient had a PCN reaction occurring within the last 10 years: yes If all of the above answers are "NO", then may proceed with Cephalosporin use.      PHYSICAL EXAM:  ECOG Performance status: 1  Vitals:   04/20/18 1453  BP: (!) 143/59  Pulse: (!) 58  Resp: 16  Temp: 98.3 F (36.8 C)  SpO2: 100%   Filed Weights   04/20/18 1453  Weight: 123 lb 9.6 oz (56.1 kg)    Physical Exam  Constitutional: She is oriented to person, place, and time. She appears well-developed and well-nourished.  Musculoskeletal: Normal range of motion.  Neurological: She is alert and oriented to person, place, and time.  Skin: Skin is warm and dry.  Psychiatric: She has a normal mood and affect. Her behavior is normal. Judgment and thought content normal.     LABORATORY DATA:  I have reviewed the labs as listed.  CBC    Component Value Date/Time   WBC 4.3 03/24/2018 0903   RBC 3.55 (L) 03/24/2018 0903   HGB 10.1 (L) 03/24/2018 0903   HCT 32.3 (L) 03/24/2018 0903   PLT 141 (L) 03/24/2018 0903   MCV 91.0 03/24/2018 0903   MCH 28.5 03/24/2018 0903   MCHC 31.3 03/24/2018 0903   RDW 17.0 (H) 03/24/2018 0903   LYMPHSABS 1.0 03/24/2018 0903   MONOABS 0.2 03/24/2018 0903   EOSABS 0.2 03/24/2018 0903   BASOSABS 0.0 03/24/2018 0903   CMP Latest Ref Rng & Units 03/24/2018 03/10/2018 02/24/2018  Glucose 70 - 99 mg/dL 109(H) 96 126(H)  BUN 8 - 23 mg/dL '20 23 20  ' Creatinine 0.44 - 1.00 mg/dL 1.23(H) 1.34(H) 1.38(H)  Sodium 135 - 145 mmol/L 140 140 140  Potassium 3.5 - 5.1 mmol/L 4.0 4.2 3.9  Chloride 98 - 111 mmol/L 106 107 107  CO2 22 - 32 mmol/L '27 26 26  ' Calcium 8.9 - 10.3 mg/dL 8.7(L) 8.5(L) 8.5(L)  Total Protein 6.5 - 8.1 g/dL 7.4 7.0 7.0  Total Bilirubin 0.3 - 1.2 mg/dL 0.6 0.7 0.6  Alkaline Phos 38 - 126 U/L 140(H) 143(H) 142(H)  AST 15 - 41 U/L 14(L) 16 14(L)  ALT 0 - 44 U/L '8 9 10       ' DIAGNOSTIC IMAGING:  I have independently  reviewed the scans and discussed with the patient.  I have reviewed Francene Finders, NP's note and agree with the documentation.  I personally performed a face-to-face visit, made revisions and my assessment and plan is as follows.     ASSESSMENT & PLAN:   Pancreatic cancer metastasized to liver ([place]) 1.  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 28 days started on 01/28/2017 -CT scan done on 07/13/2017 shows stable liver disease. CA 19-9 was on 06/24/2017 at 129.  CA-19-9 at last visit went up to 171. - I discussed the latest CT scan reports which showed progression in the liver lesions which are very subtle.  There is progression in the tail of the pancreas lesion measuring 27 x 27  mm.  Even though the progression is very small, her CA 19-9 is continuing to go up, last one at 171.  Hence I have recommended a change in her treatment.  Gemcitabine and Tarceva were discontinued. - First cycle of infusional 5-FU and liposomal Irinotecan started on 10/21/2017, Onivyde started at a decreased dose of 50 mg/m.  The dose of infusional 5-FU was reduced by 20% after cycle 1 secondary to mucositis. - Cycle 6 chemotherapy was on 12/30/2017.  She is tolerating it very well.  We did reviewed the results of CEA which has come down to 206 on 12/30/2017.  This was 316 in June. - CT CAP on 01/04/2018 without contrast shows stable mass in the pancreatic tail and less distinctively seen hepatic metastasis.  - She has completed cycle 12 on 03/24/2018.  She is tolerating it very well except some minor side effects including hyperpigmentation of the skin. - She is continuing to get response with her tumor marker trending down from 316 prior to start of therapy to 110 on 03/10/2018. -I  reviewed the results of the CT scan of the chest, abdomen and pelvis done without contrast.  There is a suggestion of a new lesion in segment 4 of the liver measuring 1.3 cm.  I compared it with the prior scan.  There is a vague area in the prior scan.  Pancreatic tail lesion is stable.  As her tumor markers are coming down, and this is a suboptimal scan I have recommended continuing current therapy and rescanning her in 2 to 3 months. -She is agreeable to this plan.  She would like to start next week.  2.  Weight loss: She will continue Marinol 2.5 mg twice daily.  Her weight has been stable.  3.  Genetic testing: -We have made a consultation with geneticist for BRCA1/2 testing.  We will also consider MSI testing.      Orders placed this encounter:  No orders of the defined types were placed in this encounter.     Derek Jack, MD [place] [phone]

## 2018-04-20 NOTE — Patient Instructions (Signed)
[facility] at [facility]  You saw Dr. Katragadda today. _______________________________________________________________  Thank you for choosing [facility] at [facility] to provide your oncology and hematology care.  To afford each patient quality time with our providers, please arrive at least 15 minutes before your scheduled appointment.  You need to re-schedule your appointment if you arrive 10 or more minutes late.  We strive to give you quality time with our providers, and arriving late affects you and other patients whose appointments are after yours.  Also, if you no show three or more times for appointments you may be dismissed from the clinic.  Again, thank you for choosing [facility] at [facility]. Our hope is that these requests will allow you access to exceptional care and in a timely manner. _______________________________________________________________  If you have questions after your visit, please contact our office at [phone] between the hours of 8:30 a.m. and 5:00 p.m. Voicemails left after 4:30 p.m. will not be returned until the following business day. _______________________________________________________________  For prescription refill requests, have your pharmacy contact our office. _______________________________________________________________  Recommendations made by the consultant and any test results will be sent to your referring physician. _______________________________________________________________ 

## 2018-04-22 ENCOUNTER — Ambulatory Visit (INDEPENDENT_AMBULATORY_CARE_PROVIDER_SITE_OTHER): Payer: Medicare HMO | Admitting: Otolaryngology

## 2018-04-22 DIAGNOSIS — C259 Malignant neoplasm of pancreas, unspecified: Secondary | ICD-10-CM | POA: Diagnosis not present

## 2018-04-22 DIAGNOSIS — C787 Secondary malignant neoplasm of liver and intrahepatic bile duct: Secondary | ICD-10-CM | POA: Diagnosis not present

## 2018-04-28 ENCOUNTER — Inpatient Hospital Stay (HOSPITAL_COMMUNITY): Payer: Medicare HMO

## 2018-04-28 ENCOUNTER — Encounter (HOSPITAL_COMMUNITY): Payer: Self-pay

## 2018-04-28 VITALS — BP 180/64 | HR 60 | Temp 97.7°F | Resp 18 | Wt 124.6 lb

## 2018-04-28 DIAGNOSIS — C259 Malignant neoplasm of pancreas, unspecified: Secondary | ICD-10-CM | POA: Diagnosis not present

## 2018-04-28 DIAGNOSIS — C252 Malignant neoplasm of tail of pancreas: Secondary | ICD-10-CM | POA: Diagnosis not present

## 2018-04-28 DIAGNOSIS — R5383 Other fatigue: Secondary | ICD-10-CM | POA: Diagnosis not present

## 2018-04-28 DIAGNOSIS — C787 Secondary malignant neoplasm of liver and intrahepatic bile duct: Principal | ICD-10-CM

## 2018-04-28 DIAGNOSIS — I10 Essential (primary) hypertension: Secondary | ICD-10-CM | POA: Diagnosis not present

## 2018-04-28 DIAGNOSIS — Z5111 Encounter for antineoplastic chemotherapy: Secondary | ICD-10-CM | POA: Diagnosis not present

## 2018-04-28 DIAGNOSIS — R634 Abnormal weight loss: Secondary | ICD-10-CM | POA: Diagnosis not present

## 2018-04-28 DIAGNOSIS — R69 Illness, unspecified: Secondary | ICD-10-CM | POA: Diagnosis not present

## 2018-04-28 LAB — COMPREHENSIVE METABOLIC PANEL
ALT: 14 U/L (ref 0–44)
AST: 18 U/L (ref 15–41)
Albumin: 3.6 g/dL (ref 3.5–5.0)
Alkaline Phosphatase: 136 U/L — ABNORMAL HIGH (ref 38–126)
Anion gap: 7 (ref 5–15)
BUN: 24 mg/dL — ABNORMAL HIGH (ref 8–23)
CO2: 25 mmol/L (ref 22–32)
Calcium: 8.7 mg/dL — ABNORMAL LOW (ref 8.9–10.3)
Chloride: 105 mmol/L (ref 98–111)
Creatinine, Ser: 1.42 mg/dL — ABNORMAL HIGH (ref 0.44–1.00)
GFR, EST AFRICAN AMERICAN: 38 mL/min — AB (ref >60)
GFR, EST NON AFRICAN AMERICAN: 33 mL/min — AB (ref >60)
Glucose, Bld: 95 mg/dL (ref 70–99)
Potassium: 3.9 mmol/L (ref 3.5–5.1)
SODIUM: 137 mmol/L (ref 135–145)
Total Bilirubin: 0.7 mg/dL (ref 0.3–1.2)
Total Protein: 7.2 g/dL (ref 6.5–8.1)

## 2018-04-28 LAB — CBC WITH DIFFERENTIAL/PLATELET
ABS IMMATURE GRANULOCYTES: 0.02 10*3/uL (ref 0.00–0.07)
Basophils Absolute: 0 10*3/uL (ref 0.0–0.1)
Basophils Relative: 0 %
Eosinophils Absolute: 0.3 10*3/uL (ref 0.0–0.5)
Eosinophils Relative: 5 %
HCT: 33.9 % — ABNORMAL LOW (ref 36.0–46.0)
Hemoglobin: 10.7 g/dL — ABNORMAL LOW (ref 12.0–15.0)
Immature Granulocytes: 0 %
Lymphocytes Relative: 17 %
Lymphs Abs: 1.2 10*3/uL (ref 0.7–4.0)
MCH: 29.3 pg (ref 26.0–34.0)
MCHC: 31.6 g/dL (ref 30.0–36.0)
MCV: 92.9 fL (ref 80.0–100.0)
Monocytes Absolute: 0.5 10*3/uL (ref 0.1–1.0)
Monocytes Relative: 7 %
NEUTROS ABS: 4.9 10*3/uL (ref 1.7–7.7)
Neutrophils Relative %: 71 %
Platelets: 155 10*3/uL (ref 150–400)
RBC: 3.65 MIL/uL — ABNORMAL LOW (ref 3.87–5.11)
RDW: 16.8 % — ABNORMAL HIGH (ref 11.5–15.5)
WBC: 6.9 10*3/uL (ref 4.0–10.5)
nRBC: 0 % (ref 0.0–0.2)

## 2018-04-28 MED ORDER — LIDOCAINE-PRILOCAINE 2.5-2.5 % EX CREA: 1.0000 "application " | TOPICAL_CREAM | CUTANEOUS | 0 refills | Status: DC | PRN

## 2018-04-28 MED ORDER — SODIUM CHLORIDE 0.9% FLUSH
10.0000 mL | INTRAVENOUS | Status: DC | PRN
  Administered 2018-04-28: 10 mL
  Filled 2018-04-28: qty 10

## 2018-04-28 MED ORDER — LEUCOVORIN CALCIUM INJECTION 350 MG
700.0000 mg | Freq: Once | INTRAVENOUS | Status: AC
  Administered 2018-04-28: 700 mg via INTRAVENOUS
  Filled 2018-04-28: qty 35

## 2018-04-28 MED ORDER — SODIUM CHLORIDE 0.9 % IV SOLN
INTRAVENOUS | Status: AC
  Administered 2018-04-28: 09:00:00 via INTRAVENOUS

## 2018-04-28 MED ORDER — SODIUM CHLORIDE 0.9 % IV SOLN
Freq: Once | INTRAVENOUS | Status: AC
  Administered 2018-04-28: 10:00:00 via INTRAVENOUS

## 2018-04-28 MED ORDER — SODIUM CHLORIDE 0.9 % IV SOLN
1920.0000 mg/m2 | INTRAVENOUS | Status: DC
  Administered 2018-04-28: 3100 mg via INTRAVENOUS
  Filled 2018-04-28: qty 62

## 2018-04-28 MED ORDER — SODIUM CHLORIDE 0.9 % IV SOLN
Freq: Once | INTRAVENOUS | Status: AC
  Administered 2018-04-28: 09:00:00 via INTRAVENOUS
  Filled 2018-04-28: qty 4

## 2018-04-28 MED ORDER — SODIUM CHLORIDE 0.9 % IV SOLN
50.0000 mg/m2 | Freq: Once | INTRAVENOUS | Status: AC
  Administered 2018-04-28: 81.7 mg via INTRAVENOUS
  Filled 2018-04-28: qty 19

## 2018-04-28 NOTE — Patient Instructions (Signed)
[facility] Discharge Instructions for Patients Receiving Chemotherapy   Beginning January 23rd 2017 lab work for the [facility] will be done in the  Main lab at [facility] on 1st floor. If you have a lab appointment with the [facility] please come in thru the  Main Entrance and check in at the main information desk   Today you received the following chemotherapy agents Onyvide,Leucovorin and 5FU. Follow-up as scheduled. Call clinic for any questions or concerns  To help prevent nausea and vomiting after your treatment, we encourage you to take your nausea medication   If you develop nausea and vomiting, or diarrhea that is not controlled by your medication, call the clinic.  The clinic phone number is [phone]. Office hours are Monday-Friday 8:30am-5:00pm.  BELOW ARE SYMPTOMS THAT SHOULD BE REPORTED IMMEDIATELY:  *FEVER GREATER THAN 101.0 F  *CHILLS WITH OR WITHOUT FEVER  NAUSEA AND VOMITING THAT IS NOT CONTROLLED WITH YOUR NAUSEA MEDICATION  *UNUSUAL SHORTNESS OF BREATH  *UNUSUAL BRUISING OR BLEEDING  TENDERNESS IN MOUTH AND THROAT WITH OR WITHOUT PRESENCE OF ULCERS  *URINARY PROBLEMS  *BOWEL PROBLEMS  UNUSUAL RASH Items with * indicate a potential emergency and should be followed up as soon as possible. If you have an emergency after office hours please contact your primary care physician or go to the nearest emergency department.  Please call the clinic during office hours if you have any questions or concerns.   You may also contact the Patient Navigator at [phone] should you have any questions or need assistance in obtaining follow up care.      Resources For Cancer Patients and their Caregivers ? American Cancer Society: Can assist with transportation, wigs, general needs, runs Look Good Feel Better.        ([phone] 609-7525 ? Cancer Care: Provides financial assistance, online support groups, medication/co-pay assistance.   1-800-813-HOPE (541)873-9387) ? [facility] Assists [place] cancer patients and their families through emotional , educational and financial support.  (613) 351-4532 ? [place] DSS Where to apply for food stamps, Medicaid and utility assistance. (470)499-2398 ? [facility]: Transportation to medical appointments. 307-338-2099 ? Social Security Administration: May apply for disability if have a Stage IV cancer. 201-759-9668 [phone] ? [employer], Disability and Transit Services: Assists with nutrition, care and transit needs. [phone]

## 2018-04-28 NOTE — Progress Notes (Signed)
0850 Labs reviewed with Dr. Delton Coombes and pt approved for chemo tx today with 500 ml NS over 1 hr added per MD                                                     [name] tolerated chemo tx well without complaints or incident. Pt discharged with 5FU pump infusing without issues. VSS upon discharge. Pt discharged self ambulatory using cane in satisfactory condition

## 2018-04-30 ENCOUNTER — Inpatient Hospital Stay (HOSPITAL_COMMUNITY): Payer: Medicare HMO

## 2018-04-30 ENCOUNTER — Other Ambulatory Visit: Payer: Self-pay

## 2018-04-30 ENCOUNTER — Encounter (HOSPITAL_COMMUNITY): Payer: Self-pay

## 2018-04-30 VITALS — BP 158/70 | HR 56 | Temp 98.0°F | Resp 18 | Wt 122.5 lb

## 2018-04-30 DIAGNOSIS — C787 Secondary malignant neoplasm of liver and intrahepatic bile duct: Principal | ICD-10-CM

## 2018-04-30 DIAGNOSIS — R3915 Urgency of urination: Secondary | ICD-10-CM

## 2018-04-30 DIAGNOSIS — C259 Malignant neoplasm of pancreas, unspecified: Secondary | ICD-10-CM

## 2018-04-30 MED ORDER — HEPARIN SOD (PORK) LOCK FLUSH 100 UNIT/ML IV SOLN: 500.0000 [IU] | Freq: Once | INTRAVENOUS | Status: DC | PRN

## 2018-04-30 MED ORDER — SODIUM CHLORIDE 0.9% FLUSH: 10.0000 mL | INTRAVENOUS | Status: DC | PRN

## 2018-04-30 NOTE — Progress Notes (Signed)
[name] presents to have home infusion pump d/c'd and for port-a-cath deaccess with flush.  Proper placement of portacath confirmed by CXR.  Portacath located right chest wall accessed with  H 20 needle.  Good blood return present. Portacath flushed with NS and 500U/20ml Heparin, and needle removed intact.  Procedure tolerated well and without incident.  Discharged ambulatory.

## 2018-05-03 MED ORDER — MIRABEGRON ER 25 MG PO TB24: 25.0000 mg | ORAL_TABLET | Freq: Every day | ORAL | 4 refills | Status: DC

## 2018-05-03 NOTE — Addendum Note (Signed)
Addended by: Joie Bimler on: 05/03/2018 02:26 PM   Modules accepted: Orders

## 2018-05-17 ENCOUNTER — Other Ambulatory Visit (HOSPITAL_COMMUNITY): Payer: Medicare HMO

## 2018-05-17 ENCOUNTER — Encounter (HOSPITAL_COMMUNITY): Payer: Self-pay | Admitting: Hematology

## 2018-05-17 ENCOUNTER — Ambulatory Visit (HOSPITAL_COMMUNITY): Payer: Medicare HMO

## 2018-05-17 ENCOUNTER — Inpatient Hospital Stay (HOSPITAL_BASED_OUTPATIENT_CLINIC_OR_DEPARTMENT_OTHER): Payer: Medicare HMO | Admitting: Hematology

## 2018-05-17 ENCOUNTER — Ambulatory Visit (HOSPITAL_COMMUNITY): Payer: Medicare HMO | Admitting: Hematology

## 2018-05-17 ENCOUNTER — Other Ambulatory Visit: Payer: Self-pay

## 2018-05-17 ENCOUNTER — Inpatient Hospital Stay (HOSPITAL_COMMUNITY): Payer: Medicare HMO

## 2018-05-17 ENCOUNTER — Inpatient Hospital Stay (HOSPITAL_COMMUNITY): Payer: Medicare HMO | Attending: Hematology

## 2018-05-17 VITALS — BP 130/57 | HR 52 | Temp 98.4°F | Resp 16 | Wt 125.0 lb

## 2018-05-17 DIAGNOSIS — R252 Cramp and spasm: Secondary | ICD-10-CM | POA: Diagnosis not present

## 2018-05-17 DIAGNOSIS — C252 Malignant neoplasm of tail of pancreas: Secondary | ICD-10-CM | POA: Insufficient documentation

## 2018-05-17 DIAGNOSIS — C259 Malignant neoplasm of pancreas, unspecified: Secondary | ICD-10-CM

## 2018-05-17 DIAGNOSIS — R634 Abnormal weight loss: Secondary | ICD-10-CM

## 2018-05-17 DIAGNOSIS — Z5111 Encounter for antineoplastic chemotherapy: Secondary | ICD-10-CM | POA: Insufficient documentation

## 2018-05-17 DIAGNOSIS — C787 Secondary malignant neoplasm of liver and intrahepatic bile duct: Secondary | ICD-10-CM

## 2018-05-17 DIAGNOSIS — I10 Essential (primary) hypertension: Secondary | ICD-10-CM

## 2018-05-17 LAB — COMPREHENSIVE METABOLIC PANEL
ALT: 12 U/L (ref 0–44)
AST: 17 U/L (ref 15–41)
Albumin: 3.3 g/dL — ABNORMAL LOW (ref 3.5–5.0)
Alkaline Phosphatase: 149 U/L — ABNORMAL HIGH (ref 38–126)
Anion gap: 6 (ref 5–15)
BUN: 22 mg/dL (ref 8–23)
CO2: 26 mmol/L (ref 22–32)
Calcium: 8.3 mg/dL — ABNORMAL LOW (ref 8.9–10.3)
Chloride: 106 mmol/L (ref 98–111)
Creatinine, Ser: 1.45 mg/dL — ABNORMAL HIGH (ref 0.44–1.00)
GFR calc Af Amer: 37 mL/min — ABNORMAL LOW (ref >60)
GFR, EST NON AFRICAN AMERICAN: 32 mL/min — AB (ref >60)
Glucose, Bld: 107 mg/dL — ABNORMAL HIGH (ref 70–99)
Potassium: 4 mmol/L (ref 3.5–5.1)
Sodium: 138 mmol/L (ref 135–145)
Total Bilirubin: 0.5 mg/dL (ref 0.3–1.2)
Total Protein: 6.6 g/dL (ref 6.5–8.1)

## 2018-05-17 LAB — CBC WITH DIFFERENTIAL/PLATELET
Abs Immature Granulocytes: 0.01 10*3/uL (ref 0.00–0.07)
BASOS PCT: 1 %
Basophils Absolute: 0 10*3/uL (ref 0.0–0.1)
Eosinophils Absolute: 0.2 10*3/uL (ref 0.0–0.5)
Eosinophils Relative: 6 %
HCT: 32.2 % — ABNORMAL LOW (ref 36.0–46.0)
Hemoglobin: 10.2 g/dL — ABNORMAL LOW (ref 12.0–15.0)
Immature Granulocytes: 0 %
Lymphocytes Relative: 37 %
Lymphs Abs: 1.2 10*3/uL (ref 0.7–4.0)
MCH: 28.8 pg (ref 26.0–34.0)
MCHC: 31.7 g/dL (ref 30.0–36.0)
MCV: 91 fL (ref 80.0–100.0)
Monocytes Absolute: 0.4 10*3/uL (ref 0.1–1.0)
Monocytes Relative: 12 %
Neutro Abs: 1.4 10*3/uL — ABNORMAL LOW (ref 1.7–7.7)
Neutrophils Relative %: 44 %
PLATELETS: 153 10*3/uL (ref 150–400)
RBC: 3.54 MIL/uL — ABNORMAL LOW (ref 3.87–5.11)
RDW: 16.8 % — ABNORMAL HIGH (ref 11.5–15.5)
WBC: 3.2 10*3/uL — ABNORMAL LOW (ref 4.0–10.5)
nRBC: 0 % (ref 0.0–0.2)

## 2018-05-17 LAB — MAGNESIUM: MAGNESIUM: 2.1 mg/dL (ref 1.7–2.4)

## 2018-05-17 MED ORDER — SODIUM CHLORIDE 0.9 % IV SOLN
Freq: Once | INTRAVENOUS | Status: AC
  Administered 2018-05-17: 11:00:00 via INTRAVENOUS

## 2018-05-17 MED ORDER — SODIUM CHLORIDE 0.9 % IV SOLN
50.0000 mg/m2 | Freq: Once | INTRAVENOUS | Status: AC
  Administered 2018-05-17: 81.7 mg via INTRAVENOUS
  Filled 2018-05-17: qty 19

## 2018-05-17 MED ORDER — LEUCOVORIN CALCIUM INJECTION 350 MG
700.0000 mg | Freq: Once | INTRAVENOUS | Status: AC
  Administered 2018-05-17: 700 mg via INTRAVENOUS
  Filled 2018-05-17: qty 35

## 2018-05-17 MED ORDER — SODIUM CHLORIDE 0.9 % IV SOLN
1920.0000 mg/m2 | INTRAVENOUS | Status: DC
  Administered 2018-05-17: 3100 mg via INTRAVENOUS
  Filled 2018-05-17: qty 62

## 2018-05-17 MED ORDER — SODIUM CHLORIDE 0.9 % IV SOLN
Freq: Once | INTRAVENOUS | Status: AC
  Administered 2018-05-17: 11:00:00 via INTRAVENOUS
  Filled 2018-05-17: qty 4

## 2018-05-17 NOTE — Progress Notes (Signed)
[name], [place] [address]   CLINIC:  Medical Oncology/Hematology  PCP:  Fayrene Helper, MD [address], [place] [address] [phone]   REASON FOR VISIT: Follow-up for pancreatic cancer to the liver  CURRENT THERAPY: Infusional 5FU and liposomal Irinotecan  BRIEF ONCOLOGIC HISTORY:    Pancreatic cancer metastasized to liver ([place])   03/10/2014 Initial Diagnosis    Pancreatic cancer metastasized to liver    03/22/2014 - 03/05/2016 Chemotherapy    Abraxane/Gemzar days 1, 8, every 28 days.  Day 15 was cancelled due to leukopenia and thrombocytopenia on day 15 cycle 1.    05/24/2014 Treatment Plan Change    Day 8 of cycle 3 is held with ANC of 1.1    06/05/2014 Imaging    CT C/A/P . Interval decrease in size of the pancreatic tail mass.Improved hepatic metastatic disease. No new lesions. No CT findings for metastatic disease involving the chest.    08/09/2014 Tumor Marker    CA 19-9= 33 (WNL)    10/26/2014 Imaging    MRI- Continued interval decrease in size of the hepatic metastatic lesions and no new lesions are identified. Continued decrease in size of the pancreatic tail lesion.    01/03/2015 Tumor Marker    Results for [name], [name] (MRN [id number]) as of 01/18/2015 12:52  01/03/2015 10:00 CA 19-9: 14     01/17/2015 Imaging    MRI- Response to therapy of hepatic metastasis.  Similar size of a pancreatic tail lesion.    03/20/2015 Imaging    MRI-L spine- Severe disc space narrowing at L2-L3, with endplate reactive changes. Large disc extrusion into the ventral epidural space,central to the RIGHT with a cephalad migrated free fragment. Additional Large disc extrusion into the retroperitone...    03/22/2015 Imaging    CT pelvis- No evidence for metastatic disease within the pelvis.    07/24/2015 Imaging    MRI abd- Continued response to therapy, with no residual detectable liver metastases. No new sites of  metastatic disease in the abdomen.     08/15/2015 Code Status     She confirms desire for DNR status.    12/21/2015 Imaging    MRI liver- Severe image degradation due to motion artifact reducing diagnostic sensitivity and specificity. Reduce conspicuity of the pancreatic tail lesion suggesting further improvement. The original liver lesions have resolved.    03/05/2016 Treatment Plan Change    Chemotherapy holiday/Break after 2 years worth of treatment    04/07/2016 Imaging    CT chest- When compared to recent chest CT, new minimally displaced anterior left sixth rib fracture. Slight increase in subcarinal adenopathy    04/28/2016 Imaging    Bone density- BMD as determined from Femur Neck Right is 0.705 g/cm2 with a T-Score of -2.4. This patient is considered osteopenic according to [facility]) criteria. Compared with the prior study on 02/23/2013, the BMD of the lumbar spine/rt. femoral neck show a statistically significant decrease.     05/23/2016 Imaging    MRI abd- 1. Exam is significantly degraded by patient respiratory motion. Consider follow-up exams with CT abdomen with without contrast per pancreatic protocol. 2. Fullness in tail of pancreas is more prominent and potentially increased in size. Recommend close attention on follow-up. (Consider CT as above) 3. No explanation for back pain.    09/11/2016 Imaging    MRI pancreas- Interval progression of pancreatic tail lesion. New differential perfusion right liver with areas  of heterogeneity. Underlying metastatic disease in this region not excluded.    09/11/2016 Progression    MRi in conjunction with rising CA 19-9 are indicative of relapse of disease.    01/09/2017 Progression    CT C/A/P: 2.1 x 2.9 cm lesion in the pancreatic tail and abutting the splenic hilum, corresponding to known primary pancreatic neoplasm, mildly increased.  Scattered small hypoenhancing lesions in the liver measuring up  to 10 mm, approximately 8-10 in number, suspicious for hepatic metastases.  No findings specific for metastatic disease in the chest.  Additional ancillary findings as above.     01/27/2017 - 10/20/2017 Chemotherapy    The patient had gemcitabine (GEMZAR) 1,292 mg in sodium chloride 0.9 % 250 mL chemo infusion, 800 mg/m2 = 1,292 mg, Intravenous,  Once, 9 of 12 cycles Administration: 1,292 mg (01/28/2017), 1,292 mg (02/11/2017), 1,292 mg (02/25/2017), 1,292 mg (03/11/2017), 1,292 mg (03/25/2017), 1,292 mg (04/08/2017), 1,292 mg (04/22/2017), 1,292 mg (04/29/2017), 1,292 mg (05/20/2017), 1,292 mg (06/03/2017), 1,292 mg (06/24/2017), 1,292 mg (07/08/2017), 1,292 mg (07/29/2017), 1,292 mg (08/12/2017), 1,292 mg (08/26/2017), 1,292 mg (09/09/2017), 1,292 mg (09/23/2017), 1,292 mg (10/07/2017)  for chemotherapy treatment.     10/21/2017 -  Chemotherapy    The patient had leucovorin 700 mg in dextrose 5 % 250 mL infusion, 644 mg, Intravenous,  Once, 14 of 19 cycles Administration: 700 mg (10/21/2017), 700 mg (11/04/2017), 700 mg (11/18/2017), 700 mg (12/02/2017), 700 mg (12/16/2017), 700 mg (12/30/2017), 700 mg (01/13/2018), 700 mg (01/27/2018), 700 mg (02/10/2018), 700 mg (02/24/2018), 700 mg (03/10/2018), 700 mg (03/24/2018), 700 mg (04/28/2018), 700 mg (05/17/2018) ondansetron (ZOFRAN) 8 mg in sodium chloride 0.9 % 50 mL IVPB, , Intravenous,  Once, 13 of 18 cycles Administration:  (11/04/2017),  (01/27/2018),  (02/10/2018),  (02/24/2018),  (03/10/2018),  (03/24/2018),  (04/28/2018),  (05/17/2018) fluorouracil (ADRUCIL) 3,850 mg in sodium chloride 0.9 % 73 mL chemo infusion, 2,400 mg/m2 = 3,850 mg, Intravenous, 1 Day/Dose, 14 of 19 cycles Dose modification: 1,920 mg/m2 (original dose 2,400 mg/m2, Cycle 2, Reason: Provider Judgment, Comment: mucisitis) Administration: 3,850 mg (10/21/2017), 3,100 mg (11/04/2017), 3,100 mg (11/18/2017), 3,100 mg (12/02/2017), 3,100 mg (12/16/2017), 3,100 mg (12/30/2017), 3,100 mg (01/13/2018), 3,100 mg  (01/27/2018), 3,100 mg (02/10/2018), 3,100 mg (02/24/2018), 3,100 mg (03/10/2018), 3,100 mg (03/24/2018), 3,100 mg (04/28/2018), 3,100 mg (05/17/2018) irinotecan LIPOSOME (ONIVYDE) 81.7 mg in sodium chloride 0.9 % 500 mL chemo infusion, 50 mg/m2 = 81.7 mg (100 % of original dose 50 mg/m2), Intravenous, Once, 14 of 19 cycles Dose modification: 50 mg/m2 (original dose 50 mg/m2, Cycle 1, Reason: Provider Judgment), 50 mg/m2 (original dose 50 mg/m2, Cycle 2, Reason: Provider Judgment) Administration: 81.7 mg (10/21/2017), 81.7 mg (11/04/2017), 81.7 mg (11/18/2017), 81.7 mg (12/02/2017), 81.7 mg (12/16/2017), 81.7 mg (12/30/2017), 81.7 mg (01/13/2018), 81.7 mg (01/27/2018), 81.7 mg (02/10/2018), 81.7 mg (02/24/2018), 81.7 mg (03/10/2018), 81.7 mg (03/24/2018), 81.7 mg (04/28/2018), 81.7 mg (05/17/2018)  for chemotherapy treatment.       CANCER STAGING: Cancer Staging Pancreatic cancer metastasized to liver [facility]) Staging form: Pancreas, AJCC 7th Edition - Clinical: Stage IV (T3, N1, M1) - Signed by Baird Cancer, PA-C on 03/16/2014 - Pathologic: No stage assigned - Unsigned    INTERVAL HISTORY:  Ms. Finan 85 y.o. female returns for routine follow-up for pancreatic cancer to the liver. She is still having leg cramping. otherwise she is doing well and tolerating treatment well. Denies any nausea or vomiting. Denies any new pains. Had not noticed any recent bleeding such as epistaxis, hematuria or  hematochezia. Denies recent chest pain on exertion, shortness of breath on minimal exertion, pre-syncopal episodes, or palpitations. Denies any numbness or tingling in hands or feet. Denies any recent fevers, infections, or recent hospitalizations. She reports her appetite at 50% and energy level at 75%.    REVIEW OF SYSTEMS:  Review of Systems  Cardiovascular: Positive for leg swelling.  Gastrointestinal: Positive for diarrhea.  All other systems reviewed and are negative.    PAST MEDICAL/SURGICAL HISTORY:  Past  Medical History:  Diagnosis Date  . Anemia due to antineoplastic chemotherapy 09/12/2015   Started Aranesp 500 mcg on 09/12/2015  . Anxiety   . Chronic diarrhea   . Depression   . DNR (do not resuscitate) 08/16/2015  . Erosive esophagitis   . GERD (gastroesophageal reflux disease)   . Hx of adenomatous colonic polyps    tubular adenomas, last found in 2008  . Hyperplastic colon polyp 03/19/10   tcs by Dr. Gala Romney  . Hypertension 20 years   . Kidney stone    hx/ crushed   . Opioid contract exists 04/18/2015   With Dr. Moshe Cipro  . Pancreatic cancer ([place]) 02/2014  . Pancreatic cancer metastasized to liver ([place]) 03/10/2014  . Schatzki's ring 08/26/10   Last dilated on EGD by Dr. Trevor Iha [facility], linear gastric erosions, BI hemigastrectomy   Past Surgical History:  Procedure Laterality Date  . BREAST LUMPECTOMY Left   . bunion removal     from both feet   . CHOLECYSTECTOMY  1965   . COLONOSCOPY  03/19/2010   DR Gala Romney,, normal TI, pancolonic diverticula, random colon bx neg., hyperplastic polyps removed  . ESOPHAGOGASTRODUODENOSCOPY  11/22/2003   DR Gala Romney, erosive RE, Billroth I  . ESOPHAGOGASTRODUODENOSCOPY  08/26/10   Dr. Gala Romney- moderate severe ERE, Scahtzki ring s/p dilation, Billroth I, linear gastric erosions, bx-gastric xanthelasma  . LEFT SHOULDER SURGERY  2009   DR HARRISON  . ORIF ANKLE FRACTURE Right 05/22/2013   Procedure: OPEN REDUCTION INTERNAL FIXATION (ORIF) RIGHT ANKLE FRACTURE;  Surgeon: Sanjuana Kava, MD;  Location: [facility];  Service: Orthopedics;  Laterality: Right;  . stomach ulcer  50 years ago    had some of her stomach removed      SOCIAL HISTORY:  Social History   Socioeconomic History  . Marital status: Widowed    Spouse name: Not on file  . Number of children: 2  . Years of education: 21  . Highest education level: 12th grade  Occupational History  . Occupation: retired from Estate manager/land agent: RETIRED  Social Needs  . Financial resource strain: Not hard  at all  . Food insecurity:    Worry: Never true    Inability: Never true  . Transportation needs:    Medical: No    Non-medical: No  Tobacco Use  . Smoking status: Current Some Day Smoker    Packs/day: 0.50    Years: 60.00    Pack years: 30.00    Types: Cigarettes    Last attempt to quit: 09/09/2016    Years since quitting: 1.6  . Smokeless tobacco: Never Used  Substance and Sexual Activity  . Alcohol use: No  . Drug use: No  . Sexual activity: Not Currently  Lifestyle  . Physical activity:    Days per week: 3 days    Minutes per session: 30 min  . Stress: Not at all  Relationships  . Social connections:    Talks on phone: More than three times a week  Gets together: Three times a week    Attends religious service: 1 to 4 times per year    Active member of club or organization: No    Attends meetings of clubs or organizations: Never    Relationship status: Widowed  . Intimate partner violence:    Fear of current or ex partner: No    Emotionally abused: No    Physically abused: No    Forced sexual activity: No  Other Topics Concern  . Not on file  Social History Narrative   Lives with son alone     FAMILY HISTORY:  Family History  Problem Relation Age of Onset  . Hypertension Mother   . Kidney failure Brother        on dialysis  . Diabetes Sister   . Diabetes Sister   . Hypertension Father   . Kidney failure Sister   . Kidney Stones Brother   . Breast cancer Son   . Gout Son   . Liver disease Neg Hx   . Colon cancer Neg Hx     CURRENT MEDICATIONS:  Outpatient Encounter Medications as of 05/17/2018  Medication Sig  . acetaminophen (TYLENOL) 500 MG tablet Take 500 mg by mouth every 6 (six) hours as needed for mild pain or moderate pain.   Marland Kitchen amLODipine (NORVASC) 10 MG tablet TAKE 1 TABLET BY MOUTH EVERY DAY  . clonazePAM (KLONOPIN) 0.5 MG tablet Take one tablet daily for anxiety  . cyclobenzaprine (FLEXERIL) 5 MG tablet TK 1 T PO QHS FOR 7 DAYS THEN PRF  PAIN  . cycloSPORINE (RESTASIS) 0.05 % ophthalmic emulsion Place 1 drop into both eyes 2 (two) times daily.  . diphenhydrAMINE (BENADRYL) 50 MG tablet Take 1 tablet 1 hour prior to CT  . fluorometholone (FML) 0.1 % ophthalmic suspension INSTILL 1 DROP INTO EACH EYE THREE TIMES DAILY FOR 14 DAYS  . lidocaine-prilocaine (EMLA) cream Apply 1 application topically as needed. Apply to portacath site as needed  . metoprolol tartrate (LOPRESSOR) 25 MG tablet Take one tablet once daily for blood pressure  . mirabegron ER (MYRBETRIQ) 25 MG TB24 tablet Take 1 tablet (25 mg total) by mouth daily.  . pantoprazole (PROTONIX) 20 MG tablet TAKE 1 TABLET(20 MG) BY MOUTH DAILY  . traMADol (ULTRAM) 50 MG tablet One tablet once daily, as needed, for uncontrolled pain  . [DISCONTINUED] lidocaine (XYLOCAINE) 2 % solution RINSE WITH 5ML AS NEEDED TO RELIEVE MOUTH PAIN.  . [DISCONTINUED] potassium chloride (K-DUR) 10 MEQ tablet Take 1 tablet (10 mEq total) by mouth 2 (two) times daily.  . [DISCONTINUED] prochlorperazine (COMPAZINE) 10 MG tablet Take 1 tablet (10 mg total) by mouth every 6 (six) hours as needed (Nausea or vomiting).   Facility-Administered Encounter Medications as of 05/17/2018  Medication  . dexamethasone (DECADRON) 10 mg in sodium chloride 0.9 % 50 mL IVPB  . sodium chloride flush (NS) 0.9 % injection 10 mL    ALLERGIES:  Allergies  Allergen Reactions  . Iohexol Hives and Shortness Of Breath    **Patient can not have IV contrast, pt has breakthrough reaction even after 13 hour premeds** Do not give IV contrast PATIENT HAD TO BE TAKEN TO ED, C/O WHELPS, HIVES, DIFFICULTY BREATHING. PATIENT GIVEN IV CONTRAST AFTER 13 HOUR PRE MEDS ON 01/09/2017 WITH NO REACTION NOTED. On 10/19/17 patient had reaction despite premedication.    Marland Kitchen Aciphex [Rabeprazole Sodium] Other (See Comments)    unknown  . Amlodipine Besylate-Valsartan     Rash to high-dose (  5/320)  . Esomeprazole Magnesium   . Omeprazole Other  (See Comments)    Patient states "medication didn't work"  . Ciprofloxacin Rash  . Penicillins Swelling and Rash    Has patient had a PCN reaction causing immediate rash, facial/tongue/throat swelling, SOB or lightheadedness with hypotension: Yes Has patient had a PCN reaction causing severe rash involving mucus membranes or skin necrosis: Yes Has patient had a PCN reaction that required hospitalization: No Has patient had a PCN reaction occurring within the last 10 years: yes If all of the above answers are "NO", then may proceed with Cephalosporin use.      PHYSICAL EXAM:  ECOG Performance status: 1  Vitals:   05/17/18 1000  BP: (!) 130/57  Pulse: (!) 52  Resp: 16  Temp: 98.4 F (36.9 C)  SpO2: 100%   Filed Weights   05/17/18 1000  Weight: 125 lb (56.7 kg)    Physical Exam Constitutional:      Appearance: Normal appearance. She is normal weight.  Musculoskeletal: Normal range of motion.  Skin:    General: Skin is warm and dry.  Neurological:     Mental Status: She is alert and oriented to person, place, and time. Mental status is at baseline.  Psychiatric:        Mood and Affect: Mood normal.        Behavior: Behavior normal.        Thought Content: Thought content normal.        Judgment: Judgment normal.      LABORATORY DATA:  I have reviewed the labs as listed.  CBC    Component Value Date/Time   WBC 3.2 (L) 05/17/2018 0928   RBC 3.54 (L) 05/17/2018 0928   HGB 10.2 (L) 05/17/2018 0928   HCT 32.2 (L) 05/17/2018 0928   PLT 153 05/17/2018 0928   MCV 91.0 05/17/2018 0928   MCH 28.8 05/17/2018 0928   MCHC 31.7 05/17/2018 0928   RDW 16.8 (H) 05/17/2018 0928   LYMPHSABS 1.2 05/17/2018 0928   MONOABS 0.4 05/17/2018 0928   EOSABS 0.2 05/17/2018 0928   BASOSABS 0.0 05/17/2018 0928   CMP Latest Ref Rng & Units 05/17/2018 04/28/2018 03/24/2018  Glucose 70 - 99 mg/dL 107(H) 95 109(H)  BUN 8 - 23 mg/dL 22 24(H) 20  Creatinine 0.44 - 1.00 mg/dL 1.45(H) 1.42(H)  1.23(H)  Sodium 135 - 145 mmol/L 138 137 140  Potassium 3.5 - 5.1 mmol/L 4.0 3.9 4.0  Chloride 98 - 111 mmol/L 106 105 106  CO2 22 - 32 mmol/L _0 Calcium 8.9 - 10.3 mg/dL 8.3(L) 8.7(L) 8.7(L)  Total Protein 6.5 - 8.1 g/dL 6.6 7.2 7.4  Total Bilirubin 0.3 - 1.2 mg/dL 0.5 0.7 0.6  Alkaline Phos 38 - 126 U/L 149(H) 136(H) 140(H)  AST 15 - 41 U/L 17 18 14(L)  ALT 0 - 44 U/L _1 DIAGNOSTIC IMAGING:  I have independently reviewed the scans and discussed with the patient.   I have reviewed Francene Finders, NP's note and agree with the documentation.  I personally performed a face-to-face visit, made revisions and my assessment and plan is as follows.    ASSESSMENT & PLAN:   Pancreatic cancer metastasized to liver ([place]) 1.  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 28 days started on 01/28/2017 -CT scan done on 07/13/2017 shows stable liver disease. CA 19-9 was on 06/24/2017 at 129.  CA-19-9 at last visit went up to 171. - I discussed the latest CT scan reports which showed progression in the liver lesions which are very subtle.  There is progression in the tail of the pancreas lesion measuring 27 x 27 mm.  Even though the progression is very small, her CA 19-9 is continuing to go up, last one at 171.  Hence I have recommended a change in her treatment.  Gemcitabine and Tarceva were discontinued. - First cycle of infusional 5-FU and liposomal Irinotecan started on 10/21/2017, Onivyde started at a decreased dose of 50 mg/m.  The dose of infusional 5-FU was reduced by 20% after cycle 1 secondary to mucositis. - Cycle 6 chemotherapy was on 12/30/2017.  She is tolerating it very well.  We did reviewed the results of CEA which has come down to 206  on 12/30/2017.  This was 316 in June. - CT CAP on 01/04/2018 without contrast shows stable mass in the pancreatic tail and less distinctively seen hepatic metastasis.  - She has completed cycle 12 on 03/24/2018.  She is tolerating it very well except some minor side effects including hyperpigmentation of the skin. - She is continuing to get response with her tumor marker trending down from 316 prior to start of therapy to 110 on 03/10/2018. -CT CAP on 04/16/2018 was done without contrast.  There is a suggestion of a new lesion in the segment 4 of the liver measuring 1.3 cm.  I compared it with the prior scan.  There is a vague area in the prior scan.  Pancreatic tail lesion is stable.  As her tumor markers are going down, and this is a suboptimal scan, we have decided to continue therapy and rescan her in 2 to 3 months. -She has tolerated her last cycle very well.  She had mild tiredness.  Denies any abdominal cramping or major diarrhea. - We have reviewed her blood counts.  Today her [place] is 1410.  She is feeling very good.  Hence I have recommended to continue with next cycle.  She was told to call us if she has any signs of infection or fever.  I will reevaluate her in 2 weeks. -We have sent a CA 19-9 level from today which is pending.  2.  Weight loss: -She is continuing to eat well and has gained 1 to 2 pounds since last visit.  3.  Genetic testing: -We will refer her to geneticist for BRCA 1/2 testing.  We will also consider MSI testing.       Orders placed this encounter:  Orders Placed This Encounter  Procedures  . Cancer antigen 19-9  . Magnesium  . Magnesium  . CBC with Differential/Platelet  . Comprehensive metabolic panel  . Cancer antigen 19-9  . Lactate dehydrogenase      Derek Jack, MD [place] [phone]

## 2018-05-17 NOTE — Patient Instructions (Addendum)
[facility] at [facility] Discharge Instructions   Follow up in 2 weeks with labs and treatment  Thank you for choosing [facility] at [facility] to provide your oncology and hematology care.  To afford each patient quality time with our provider, please arrive at least 15 minutes before your scheduled appointment time.   If you have a lab appointment with the Cancer Center please come in thru the  Main Entrance and check in at the main information desk  You need to re-schedule your appointment should you arrive 10 or more minutes late.  We strive to give you quality time with our providers, and arriving late affects you and other patients whose appointments are after yours.  Also, if you no show three or more times for appointments you may be dismissed from the clinic at the providers discretion.     Again, thank you for choosing [facility].  Our hope is that these requests will decrease the amount of time that you wait before being seen by our physicians.       _____________________________________________________________  Should you have questions after your visit to [facility], please contact our office at [phone] between the hours of 8:00 a.m. and 4:30 p.m.  Voicemails left after 4:00 p.m. will not be returned until the following business day.  For prescription refill requests, have your pharmacy contact our office and allow 72 hours.    Cancer Center Support Programs:   > Cancer Support Group  2nd Tuesday of the month 1pm-2pm, Journey Room    

## 2018-05-17 NOTE — Progress Notes (Signed)
Dr. Delton Coombes aware of [place] 1400.  Okay to tx today per MD.   Tolerated infusions w/o adverse reaction.  Alert, in no distress.  Discharged ambulatory.

## 2018-05-17 NOTE — Assessment & Plan Note (Signed)
1.  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 28 days started on 01/28/2017 -CT scan done on 07/13/2017 shows stable liver disease. CA 19-9 was on 06/24/2017 at 129.  CA-19-9 at last visit went up to 171. - I discussed the latest CT scan reports which showed progression in the liver lesions which are very subtle.  There is progression in the tail of the pancreas lesion measuring 27 x 27 mm.  Even though the progression is very small, [name] CA 19-9 is continuing to go up, last one at 171.  Hence I have recommended a change in [name] treatment.  Gemcitabine and Tarceva were discontinued. - First cycle of infusional 5-FU and liposomal Irinotecan started on 10/21/2017, Onivyde started at a decreased dose of 50 mg/m.  The dose of infusional 5-FU was reduced by 20% after cycle 1 secondary to mucositis. - Cycle 6 chemotherapy was on 12/30/2017.  She is tolerating it very well.  We did reviewed the results of CEA which has come down to 206 on 12/30/2017.  This was 316 in June. - CT CAP on 01/04/2018 without contrast shows stable mass in the pancreatic tail and less distinctively seen hepatic metastasis.  - She has completed cycle 12 on 03/24/2018.  She is tolerating it very well except some minor side effects including hyperpigmentation of the skin. - She is continuing to get response with [name] tumor marker trending down from 316 prior to start of therapy to 110 on 03/10/2018. -CT CAP on 04/16/2018 was done without contrast.  There is a suggestion of a new lesion in the segment 4 of the liver measuring 1.3 cm.  I compared it with the prior scan.  There is a vague area in the prior scan.  Pancreatic tail lesion is stable.  As [name] tumor markers are going down, and this is a suboptimal scan,  we have decided to continue therapy and rescan [name] in 2 to 3 months. -She has tolerated [name] last cycle very well.  She had mild tiredness.  Denies any abdominal cramping or major diarrhea. - We have reviewed [name] blood counts.  Today [name] [place] is 1410.  She is feeling very good.  Hence I have recommended to continue with next cycle.  She was told to call us if she has any signs of infection or fever.  I will reevaluate [name] in 2 weeks. -We have sent a CA 19-9 level from today which is pending.  2.  Weight loss: -She is continuing to eat well and has gained 1 to 2 pounds since last visit.  3.  Genetic testing: -We will refer [name] to geneticist for BRCA 1/2 testing.  We will also consider MSI testing.

## 2018-05-18 LAB — CANCER ANTIGEN 19-9: CA 19-9: 113 U/mL — ABNORMAL HIGH (ref 0–35)

## 2018-05-19 ENCOUNTER — Inpatient Hospital Stay (HOSPITAL_COMMUNITY): Payer: Medicare HMO | Attending: Hematology

## 2018-05-19 ENCOUNTER — Encounter (HOSPITAL_COMMUNITY): Payer: Medicare HMO

## 2018-05-19 VITALS — BP 163/60 | HR 60 | Temp 98.0°F | Resp 16

## 2018-05-19 DIAGNOSIS — I10 Essential (primary) hypertension: Secondary | ICD-10-CM | POA: Diagnosis not present

## 2018-05-19 DIAGNOSIS — R252 Cramp and spasm: Secondary | ICD-10-CM | POA: Insufficient documentation

## 2018-05-19 DIAGNOSIS — C259 Malignant neoplasm of pancreas, unspecified: Secondary | ICD-10-CM | POA: Diagnosis present

## 2018-05-19 DIAGNOSIS — R634 Abnormal weight loss: Secondary | ICD-10-CM | POA: Insufficient documentation

## 2018-05-19 DIAGNOSIS — C787 Secondary malignant neoplasm of liver and intrahepatic bile duct: Secondary | ICD-10-CM | POA: Insufficient documentation

## 2018-05-19 MED ORDER — SODIUM CHLORIDE 0.9% FLUSH
10.0000 mL | INTRAVENOUS | Status: DC | PRN
  Administered 2018-05-19: 10 mL
  Filled 2018-05-19: qty 10

## 2018-05-19 MED ORDER — HEPARIN SOD (PORK) LOCK FLUSH 100 UNIT/ML IV SOLN
500.0000 [IU] | Freq: Once | INTRAVENOUS | Status: AC | PRN
  Administered 2018-05-19: 500 [IU]

## 2018-05-20 ENCOUNTER — Encounter (HOSPITAL_COMMUNITY): Payer: Medicare HMO | Admitting: Genetic Counselor

## 2018-05-20 ENCOUNTER — Other Ambulatory Visit (HOSPITAL_COMMUNITY): Payer: Medicare HMO

## 2018-05-20 ENCOUNTER — Encounter (HOSPITAL_COMMUNITY): Payer: Self-pay

## 2018-05-20 ENCOUNTER — Other Ambulatory Visit: Payer: Self-pay

## 2018-05-20 NOTE — Progress Notes (Signed)
[name] returns today for port de access and flush after 46 hr continous infusion of 33fu. Tolerated infusion without problems. Portacath located right chest wall was  deaccessed and flushed with 64ml NS and 500U/75ml Heparin and needle removed intact.  Procedure without incident. Patient tolerated procedure well.  Vitals stable and discharged home from clinic ambulatory. Follow up as scheduled.

## 2018-05-21 ENCOUNTER — Encounter: Payer: Self-pay | Admitting: *Deleted

## 2018-05-23 DIAGNOSIS — C787 Secondary malignant neoplasm of liver and intrahepatic bile duct: Secondary | ICD-10-CM | POA: Diagnosis not present

## 2018-05-23 DIAGNOSIS — C259 Malignant neoplasm of pancreas, unspecified: Secondary | ICD-10-CM | POA: Diagnosis not present

## 2018-05-31 ENCOUNTER — Other Ambulatory Visit: Payer: Self-pay

## 2018-05-31 ENCOUNTER — Encounter (HOSPITAL_COMMUNITY): Payer: Self-pay

## 2018-05-31 ENCOUNTER — Inpatient Hospital Stay (HOSPITAL_COMMUNITY): Payer: Medicare HMO

## 2018-05-31 ENCOUNTER — Other Ambulatory Visit: Payer: Self-pay | Admitting: Family Medicine

## 2018-05-31 ENCOUNTER — Telehealth: Payer: Self-pay | Admitting: Family Medicine

## 2018-05-31 VITALS — BP 179/62 | HR 57 | Temp 97.6°F | Resp 18 | Wt 123.4 lb

## 2018-05-31 DIAGNOSIS — C787 Secondary malignant neoplasm of liver and intrahepatic bile duct: Secondary | ICD-10-CM | POA: Diagnosis not present

## 2018-05-31 DIAGNOSIS — R3915 Urgency of urination: Secondary | ICD-10-CM

## 2018-05-31 DIAGNOSIS — C259 Malignant neoplasm of pancreas, unspecified: Secondary | ICD-10-CM

## 2018-05-31 DIAGNOSIS — Z5111 Encounter for antineoplastic chemotherapy: Secondary | ICD-10-CM | POA: Diagnosis not present

## 2018-05-31 DIAGNOSIS — C252 Malignant neoplasm of tail of pancreas: Secondary | ICD-10-CM | POA: Diagnosis not present

## 2018-05-31 LAB — CBC WITH DIFFERENTIAL/PLATELET
Abs Immature Granulocytes: 0.02 10*3/uL (ref 0.00–0.07)
BASOS PCT: 0 %
Basophils Absolute: 0 10*3/uL (ref 0.0–0.1)
Eosinophils Absolute: 0.4 10*3/uL (ref 0.0–0.5)
Eosinophils Relative: 7 %
HCT: 32.8 % — ABNORMAL LOW (ref 36.0–46.0)
Hemoglobin: 10.4 g/dL — ABNORMAL LOW (ref 12.0–15.0)
Immature Granulocytes: 0 %
Lymphocytes Relative: 19 %
Lymphs Abs: 1 10*3/uL (ref 0.7–4.0)
MCH: 29.1 pg (ref 26.0–34.0)
MCHC: 31.7 g/dL (ref 30.0–36.0)
MCV: 91.9 fL (ref 80.0–100.0)
Monocytes Absolute: 0.4 10*3/uL (ref 0.1–1.0)
Monocytes Relative: 8 %
NRBC: 0 % (ref 0.0–0.2)
Neutro Abs: 3.3 10*3/uL (ref 1.7–7.7)
Neutrophils Relative %: 66 %
Platelets: 130 10*3/uL — ABNORMAL LOW (ref 150–400)
RBC: 3.57 MIL/uL — AB (ref 3.87–5.11)
RDW: 16.7 % — ABNORMAL HIGH (ref 11.5–15.5)
WBC: 5 10*3/uL (ref 4.0–10.5)

## 2018-05-31 LAB — COMPREHENSIVE METABOLIC PANEL
ALT: 12 U/L (ref 0–44)
ANION GAP: 8 (ref 5–15)
AST: 17 U/L (ref 15–41)
Albumin: 3.5 g/dL (ref 3.5–5.0)
Alkaline Phosphatase: 139 U/L — ABNORMAL HIGH (ref 38–126)
BUN: 32 mg/dL — ABNORMAL HIGH (ref 8–23)
CO2: 26 mmol/L (ref 22–32)
Calcium: 8.6 mg/dL — ABNORMAL LOW (ref 8.9–10.3)
Chloride: 105 mmol/L (ref 98–111)
Creatinine, Ser: 1.61 mg/dL — ABNORMAL HIGH (ref 0.44–1.00)
GFR calc Af Amer: 33 mL/min — ABNORMAL LOW (ref >60)
GFR calc non Af Amer: 28 mL/min — ABNORMAL LOW (ref >60)
Glucose, Bld: 103 mg/dL — ABNORMAL HIGH (ref 70–99)
Potassium: 4.6 mmol/L (ref 3.5–5.1)
SODIUM: 139 mmol/L (ref 135–145)
TOTAL PROTEIN: 6.9 g/dL (ref 6.5–8.1)
Total Bilirubin: 0.6 mg/dL (ref 0.3–1.2)

## 2018-05-31 LAB — LACTATE DEHYDROGENASE: LDH: 149 U/L (ref 98–192)

## 2018-05-31 LAB — MAGNESIUM: Magnesium: 2.3 mg/dL (ref 1.7–2.4)

## 2018-05-31 MED ORDER — SODIUM CHLORIDE 0.9 % IV SOLN
Freq: Once | INTRAVENOUS | Status: AC
  Administered 2018-05-31: 10:00:00 via INTRAVENOUS
  Filled 2018-05-31: qty 4

## 2018-05-31 MED ORDER — SODIUM CHLORIDE 0.9 % IV SOLN
Freq: Once | INTRAVENOUS | Status: AC
  Administered 2018-05-31: 11:00:00 via INTRAVENOUS

## 2018-05-31 MED ORDER — AMLODIPINE BESYLATE 10 MG PO TABS: 10.0000 mg | ORAL_TABLET | Freq: Every day | ORAL | 2 refills | Status: DC

## 2018-05-31 MED ORDER — MIRABEGRON ER 25 MG PO TB24: 25.0000 mg | ORAL_TABLET | Freq: Every day | ORAL | 4 refills | Status: DC

## 2018-05-31 MED ORDER — SODIUM CHLORIDE 0.9 % IV SOLN
1920.0000 mg/m2 | INTRAVENOUS | Status: DC
  Administered 2018-05-31: 3100 mg via INTRAVENOUS
  Filled 2018-05-31: qty 62

## 2018-05-31 MED ORDER — SODIUM CHLORIDE 0.9 % IV SOLN
50.0000 mg/m2 | Freq: Once | INTRAVENOUS | Status: AC
  Administered 2018-05-31: 81.7 mg via INTRAVENOUS
  Filled 2018-05-31: qty 19

## 2018-05-31 MED ORDER — SODIUM CHLORIDE 0.9% FLUSH
10.0000 mL | INTRAVENOUS | Status: DC | PRN
  Administered 2018-05-31: 10 mL
  Filled 2018-05-31: qty 10

## 2018-05-31 MED ORDER — TRAMADOL HCL 50 MG PO TABS: ORAL_TABLET | ORAL | 0 refills | Status: DC

## 2018-05-31 MED ORDER — SODIUM CHLORIDE 0.9 % IV SOLN
INTRAVENOUS | Status: AC
  Administered 2018-05-31: 09:00:00 via INTRAVENOUS

## 2018-05-31 MED ORDER — PANTOPRAZOLE SODIUM 20 MG PO TBEC: DELAYED_RELEASE_TABLET | ORAL | 2 refills | Status: DC

## 2018-05-31 MED ORDER — LEUCOVORIN CALCIUM INJECTION 350 MG
700.0000 mg | Freq: Once | INTRAVENOUS | Status: AC
  Administered 2018-05-31: 700 mg via INTRAVENOUS
  Filled 2018-05-31: qty 35

## 2018-05-31 NOTE — Progress Notes (Signed)
[id number] Labs reviewed with Dr. Delton Coombes and pt approved for chemo tx today with hydration added, NS 250 ml NS over 1 hr, per MD             Alecia Lemming tolerated chemo tx well without complaints or incident. VSS upon discharge. Pt discharged with 5FU pump infusing without issues. Pt discharged self ambulatory using her cane in satisfactory condition accompanied by family member

## 2018-05-31 NOTE — Progress Notes (Signed)
Tramadol 50  

## 2018-05-31 NOTE — Telephone Encounter (Signed)
Please send medicine  Tramadol 50mg  Pantoprazole 20mg  Amlodipine  Besylate 10mg   [facility], pts son Barbaraann Rondo came by to ask [place] to change these over She is out of these, and will change the others over as she needs them

## 2018-05-31 NOTE — Telephone Encounter (Signed)
Called patient to discuss Tramadol. No answer. No vm. Will try again later.

## 2018-05-31 NOTE — Patient Instructions (Signed)
[facility] Discharge Instructions for Patients Receiving Chemotherapy   Beginning January 23rd 2017 lab work for the [facility] will be done in the  Main lab at [facility] on 1st floor. If you have a lab appointment with the [facility] please come in thru the  Main Entrance and check in at the main information desk   Today you received the following chemotherapy agents Onyvide,Leucovorin and 5FU. Follow-up as scheduled. Call clinic for any questions or concerns  To help prevent nausea and vomiting after your treatment, we encourage you to take your nausea medication   If you develop nausea and vomiting, or diarrhea that is not controlled by your medication, call the clinic.  The clinic phone number is [phone]. Office hours are Monday-Friday 8:30am-5:00pm.  BELOW ARE SYMPTOMS THAT SHOULD BE REPORTED IMMEDIATELY:  *FEVER GREATER THAN 101.0 F  *CHILLS WITH OR WITHOUT FEVER  NAUSEA AND VOMITING THAT IS NOT CONTROLLED WITH YOUR NAUSEA MEDICATION  *UNUSUAL SHORTNESS OF BREATH  *UNUSUAL BRUISING OR BLEEDING  TENDERNESS IN MOUTH AND THROAT WITH OR WITHOUT PRESENCE OF ULCERS  *URINARY PROBLEMS  *BOWEL PROBLEMS  UNUSUAL RASH Items with * indicate a potential emergency and should be followed up as soon as possible. If you have an emergency after office hours please contact your primary care physician or go to the nearest emergency department.  Please call the clinic during office hours if you have any questions or concerns.   You may also contact the Patient Navigator at [phone] should you have any questions or need assistance in obtaining follow up care.      Resources For Cancer Patients and their Caregivers ? American [employer] Society: Can assist with transportation, wigs, general needs, runs Look Good Feel Better.        423-683-2491 ? Cancer Care: Provides financial assistance, online support groups, medication/co-pay assistance.   1-800-813-HOPE (970) 072-6015) ? [place] Assists [place] cancer patients and their families through emotional , educational and financial support.  (956)312-6431 ? [place] DSS Where to apply for food stamps, Medicaid and utility assistance. 670 281 7587 ? [employer]: Transportation to medical appointments. 570 547 7072 ? Social Security Administration: May apply for disability if have a Stage IV cancer. 3342781949 832-477-5956 ? [employer], Disability and Transit Services: Assists with nutrition, care and transit needs. (660)880-8577

## 2018-05-31 NOTE — Telephone Encounter (Signed)
pls let her know 30 tabs sent in to last 3 months, so on average no more than 3 tabs per week, but generally twice weekly

## 2018-05-31 NOTE — Telephone Encounter (Signed)
Needs tramadol refilled. Others have been [facility].

## 2018-05-31 NOTE — Telephone Encounter (Signed)
Spoke with patient and advised of guidelines Dr.Simpson has recommended for Tramadol with verbal understanding.

## 2018-06-01 LAB — CANCER ANTIGEN 19-9: CA 19-9: 136 U/mL — ABNORMAL HIGH (ref 0–35)

## 2018-06-02 ENCOUNTER — Encounter (HOSPITAL_COMMUNITY): Payer: Self-pay

## 2018-06-02 ENCOUNTER — Inpatient Hospital Stay (HOSPITAL_COMMUNITY): Payer: Medicare HMO

## 2018-06-02 VITALS — BP 153/58 | HR 54 | Temp 97.4°F | Resp 18

## 2018-06-02 DIAGNOSIS — Z5111 Encounter for antineoplastic chemotherapy: Secondary | ICD-10-CM | POA: Diagnosis not present

## 2018-06-02 DIAGNOSIS — C787 Secondary malignant neoplasm of liver and intrahepatic bile duct: Principal | ICD-10-CM

## 2018-06-02 DIAGNOSIS — C252 Malignant neoplasm of tail of pancreas: Secondary | ICD-10-CM | POA: Diagnosis not present

## 2018-06-02 DIAGNOSIS — C259 Malignant neoplasm of pancreas, unspecified: Secondary | ICD-10-CM

## 2018-06-02 MED ORDER — HEPARIN SOD (PORK) LOCK FLUSH 100 UNIT/ML IV SOLN
INTRAVENOUS | Status: AC
  Filled 2018-06-02: qty 5

## 2018-06-02 MED ORDER — HEPARIN SOD (PORK) LOCK FLUSH 100 UNIT/ML IV SOLN
500.0000 [IU] | Freq: Once | INTRAVENOUS | Status: AC | PRN
  Administered 2018-06-02: 500 [IU]

## 2018-06-02 MED ORDER — SODIUM CHLORIDE 0.9% FLUSH
10.0000 mL | INTRAVENOUS | Status: DC | PRN
  Administered 2018-06-02: 10 mL
  Filled 2018-06-02: qty 10

## 2018-06-02 NOTE — Patient Instructions (Signed)
[facility]  Discharge Instructions:   _______________________________________________________________  Thank you for choosing [facility] at [facility] to provide your oncology and hematology care.  To afford each patient quality time with our providers, please arrive at least 15 minutes before your scheduled appointment.  You need to re-schedule your appointment if you arrive 10 or more minutes late.  We strive to give you quality time with our providers, and arriving late affects you and other patients whose appointments are after yours.  Also, if you no show three or more times for appointments you may be dismissed from the clinic.  Again, thank you for choosing [facility] at [facility]. Our hope is that these requests will allow you access to exceptional care and in a timely manner. _______________________________________________________________  If you have questions after your visit, please contact our office at [phone] between the hours of 8:30 a.m. and 5:00 p.m. Voicemails left after 4:30 p.m. will not be returned until the following business day. _______________________________________________________________  For prescription refill requests, have your pharmacy contact our office. _______________________________________________________________  Recommendations made by the consultant and any test results will be sent to your referring physician. _______________________________________________________________ 

## 2018-06-02 NOTE — Progress Notes (Signed)
[name] returns today for port de access and flush after 46 hr continous infusion of 7fu. Tolerated infusion without problems. Portacath located right chest wall was  deaccessed and flushed with 52ml NS and 500U/70ml Heparin and needle removed intact.  Procedure without incident. Patient tolerated procedure well.

## 2018-06-14 ENCOUNTER — Other Ambulatory Visit (HOSPITAL_COMMUNITY): Payer: Medicare HMO

## 2018-06-14 ENCOUNTER — Ambulatory Visit (HOSPITAL_COMMUNITY): Payer: Medicare HMO | Admitting: Hematology

## 2018-06-14 ENCOUNTER — Ambulatory Visit (HOSPITAL_COMMUNITY): Payer: Medicare HMO

## 2018-06-15 ENCOUNTER — Other Ambulatory Visit (HOSPITAL_COMMUNITY): Payer: Self-pay | Admitting: Emergency Medicine

## 2018-06-15 DIAGNOSIS — C259 Malignant neoplasm of pancreas, unspecified: Secondary | ICD-10-CM

## 2018-06-15 DIAGNOSIS — C787 Secondary malignant neoplasm of liver and intrahepatic bile duct: Principal | ICD-10-CM

## 2018-06-16 ENCOUNTER — Other Ambulatory Visit (HOSPITAL_COMMUNITY): Payer: Medicare HMO

## 2018-06-16 ENCOUNTER — Inpatient Hospital Stay (HOSPITAL_BASED_OUTPATIENT_CLINIC_OR_DEPARTMENT_OTHER): Payer: Medicare HMO | Admitting: Hematology

## 2018-06-16 ENCOUNTER — Ambulatory Visit (HOSPITAL_COMMUNITY): Payer: Medicare HMO

## 2018-06-16 ENCOUNTER — Other Ambulatory Visit: Payer: Self-pay

## 2018-06-16 ENCOUNTER — Encounter (HOSPITAL_COMMUNITY): Payer: Self-pay | Admitting: Hematology

## 2018-06-16 ENCOUNTER — Ambulatory Visit (HOSPITAL_COMMUNITY): Payer: Medicare HMO | Admitting: Hematology

## 2018-06-16 ENCOUNTER — Inpatient Hospital Stay (HOSPITAL_COMMUNITY): Payer: Medicare HMO | Attending: Hematology

## 2018-06-16 ENCOUNTER — Encounter (HOSPITAL_COMMUNITY): Payer: Medicare HMO

## 2018-06-16 ENCOUNTER — Inpatient Hospital Stay (HOSPITAL_COMMUNITY): Payer: Medicare HMO

## 2018-06-16 VITALS — BP 152/53 | HR 49 | Temp 98.1°F | Resp 14 | Wt 124.1 lb

## 2018-06-16 VITALS — BP 152/47 | HR 58 | Temp 97.8°F | Resp 18

## 2018-06-16 DIAGNOSIS — R634 Abnormal weight loss: Secondary | ICD-10-CM | POA: Diagnosis not present

## 2018-06-16 DIAGNOSIS — C259 Malignant neoplasm of pancreas, unspecified: Secondary | ICD-10-CM

## 2018-06-16 DIAGNOSIS — M7989 Other specified soft tissue disorders: Secondary | ICD-10-CM | POA: Diagnosis not present

## 2018-06-16 DIAGNOSIS — Z5111 Encounter for antineoplastic chemotherapy: Secondary | ICD-10-CM | POA: Diagnosis not present

## 2018-06-16 DIAGNOSIS — I10 Essential (primary) hypertension: Secondary | ICD-10-CM | POA: Diagnosis not present

## 2018-06-16 DIAGNOSIS — M25561 Pain in right knee: Secondary | ICD-10-CM | POA: Diagnosis not present

## 2018-06-16 DIAGNOSIS — F17210 Nicotine dependence, cigarettes, uncomplicated: Secondary | ICD-10-CM

## 2018-06-16 DIAGNOSIS — C787 Secondary malignant neoplasm of liver and intrahepatic bile duct: Principal | ICD-10-CM

## 2018-06-16 DIAGNOSIS — R69 Illness, unspecified: Secondary | ICD-10-CM | POA: Diagnosis not present

## 2018-06-16 LAB — CBC WITH DIFFERENTIAL/PLATELET
Abs Immature Granulocytes: 0.01 10*3/uL (ref 0.00–0.07)
BASOS ABS: 0 10*3/uL (ref 0.0–0.1)
Basophils Relative: 0 %
Eosinophils Absolute: 0.4 10*3/uL (ref 0.0–0.5)
Eosinophils Relative: 9 %
HCT: 33.8 % — ABNORMAL LOW (ref 36.0–46.0)
Hemoglobin: 10.7 g/dL — ABNORMAL LOW (ref 12.0–15.0)
Immature Granulocytes: 0 %
Lymphocytes Relative: 26 %
Lymphs Abs: 1.2 10*3/uL (ref 0.7–4.0)
MCH: 28.6 pg (ref 26.0–34.0)
MCHC: 31.7 g/dL (ref 30.0–36.0)
MCV: 90.4 fL (ref 80.0–100.0)
Monocytes Absolute: 0.3 10*3/uL (ref 0.1–1.0)
Monocytes Relative: 8 %
Neutro Abs: 2.6 10*3/uL (ref 1.7–7.7)
Neutrophils Relative %: 57 %
Platelets: 133 10*3/uL — ABNORMAL LOW (ref 150–400)
RBC: 3.74 MIL/uL — ABNORMAL LOW (ref 3.87–5.11)
RDW: 16.3 % — AB (ref 11.5–15.5)
WBC: 4.5 10*3/uL (ref 4.0–10.5)
nRBC: 0 % (ref 0.0–0.2)

## 2018-06-16 LAB — MAGNESIUM: Magnesium: 2.1 mg/dL (ref 1.7–2.4)

## 2018-06-16 LAB — COMPREHENSIVE METABOLIC PANEL
ALBUMIN: 3.5 g/dL (ref 3.5–5.0)
ALT: 10 U/L (ref 0–44)
AST: 15 U/L (ref 15–41)
Alkaline Phosphatase: 141 U/L — ABNORMAL HIGH (ref 38–126)
Anion gap: 6 (ref 5–15)
BUN: 17 mg/dL (ref 8–23)
CO2: 27 mmol/L (ref 22–32)
Calcium: 8.6 mg/dL — ABNORMAL LOW (ref 8.9–10.3)
Chloride: 106 mmol/L (ref 98–111)
Creatinine, Ser: 1.29 mg/dL — ABNORMAL HIGH (ref 0.44–1.00)
GFR calc Af Amer: 43 mL/min — ABNORMAL LOW (ref >60)
GFR calc non Af Amer: 37 mL/min — ABNORMAL LOW (ref >60)
Glucose, Bld: 115 mg/dL — ABNORMAL HIGH (ref 70–99)
Potassium: 4.1 mmol/L (ref 3.5–5.1)
Sodium: 139 mmol/L (ref 135–145)
Total Bilirubin: 0.6 mg/dL (ref 0.3–1.2)
Total Protein: 6.8 g/dL (ref 6.5–8.1)

## 2018-06-16 LAB — LACTATE DEHYDROGENASE: LDH: 152 U/L (ref 98–192)

## 2018-06-16 MED ORDER — SODIUM CHLORIDE 0.9 % IV SOLN
50.0000 mg/m2 | Freq: Once | INTRAVENOUS | Status: AC
  Administered 2018-06-16: 81.7 mg via INTRAVENOUS
  Filled 2018-06-16: qty 19

## 2018-06-16 MED ORDER — SODIUM CHLORIDE 0.9 % IV SOLN
Freq: Once | INTRAVENOUS | Status: AC
  Administered 2018-06-16: 10:00:00 via INTRAVENOUS

## 2018-06-16 MED ORDER — SODIUM CHLORIDE 0.9 % IV SOLN
1920.0000 mg/m2 | INTRAVENOUS | Status: DC
  Administered 2018-06-16: 3100 mg via INTRAVENOUS
  Filled 2018-06-16: qty 62

## 2018-06-16 MED ORDER — LEUCOVORIN CALCIUM INJECTION 350 MG
700.0000 mg | Freq: Once | INTRAVENOUS | Status: AC
  Administered 2018-06-16: 700 mg via INTRAVENOUS
  Filled 2018-06-16: qty 35

## 2018-06-16 MED ORDER — SODIUM CHLORIDE 0.9 % IV SOLN
Freq: Once | INTRAVENOUS | Status: AC
  Administered 2018-06-16: 11:00:00 via INTRAVENOUS
  Filled 2018-06-16: qty 4

## 2018-06-16 MED ORDER — SODIUM CHLORIDE 0.9% FLUSH
10.0000 mL | INTRAVENOUS | Status: DC | PRN
  Administered 2018-06-16: 10 mL
  Filled 2018-06-16: qty 10

## 2018-06-16 NOTE — Progress Notes (Signed)
[name], [place] [id number]   CLINIC:  Medical Oncology/Hematology  PCP:  Fayrene Helper, MD [address] [place] [address] [phone]   REASON FOR VISIT: Follow-up for pancreatic cancer to the liver  CURRENT THERAPY:Infusional 5FU and liposomal Irinotecan  BRIEF ONCOLOGIC HISTORY:    Pancreatic cancer metastasized to liver ([place])   03/10/2014 Initial Diagnosis    Pancreatic cancer metastasized to liver    03/22/2014 - 03/05/2016 Chemotherapy    Abraxane/Gemzar days 1, 8, every 28 days.  Day 15 was cancelled due to leukopenia and thrombocytopenia on day 15 cycle 1.    05/24/2014 Treatment Plan Change    Day 8 of cycle 3 is held with ANC of 1.1    06/05/2014 Imaging    CT C/A/P . Interval decrease in size of the pancreatic tail mass.Improved hepatic metastatic disease. No new lesions. No CT findings for metastatic disease involving the chest.    08/09/2014 Tumor Marker    CA 19-9= 33 (WNL)    10/26/2014 Imaging    MRI- Continued interval decrease in size of the hepatic metastatic lesions and no new lesions are identified. Continued decrease in size of the pancreatic tail lesion.    01/03/2015 Tumor Marker    Results for [name] (MRN [id number]) as of 01/18/2015 12:52  01/03/2015 10:00 CA 19-9: 14     01/17/2015 Imaging    MRI- Response to therapy of hepatic metastasis.  Similar size of a pancreatic tail lesion.    03/20/2015 Imaging    MRI-L spine- Severe disc space narrowing at L2-L3, with endplate reactive changes. Large disc extrusion into the ventral epidural space,central to the RIGHT with a cephalad migrated free fragment. Additional Large disc extrusion into the retroperitone...    03/22/2015 Imaging    CT pelvis- No evidence for metastatic disease within the pelvis.    07/24/2015 Imaging    MRI abd- Continued response to therapy, with no residual detectable liver metastases. No new sites of  metastatic disease in the abdomen.     08/15/2015 Code Status     She confirms desire for DNR status.    12/21/2015 Imaging    MRI liver- Severe image degradation due to motion artifact reducing diagnostic sensitivity and specificity. Reduce conspicuity of the pancreatic tail lesion suggesting further improvement. The original liver lesions have resolved.    03/05/2016 Treatment Plan Change    Chemotherapy holiday/Break after 2 years worth of treatment    04/07/2016 Imaging    CT chest- When compared to recent chest CT, new minimally displaced anterior left sixth rib fracture. Slight increase in subcarinal adenopathy    04/28/2016 Imaging    Bone density- BMD as determined from Femur Neck Right is 0.705 g/cm2 with a T-Score of -2.4. This patient is considered osteopenic according to [facility]) criteria. Compared with the prior study on 02/23/2013, the BMD of the lumbar spine/rt. femoral neck show a statistically significant decrease.     05/23/2016 Imaging    MRI abd- 1. Exam is significantly degraded by patient respiratory motion. Consider follow-up exams with CT abdomen with without contrast per pancreatic protocol. 2. Fullness in tail of pancreas is more prominent and potentially increased in size. Recommend close attention on follow-up. (Consider CT as above) 3. No explanation for back pain.    09/11/2016 Imaging    MRI pancreas- Interval progression of pancreatic tail lesion. New differential perfusion right liver with areas of  heterogeneity. Underlying metastatic disease in this region not excluded.    09/11/2016 Progression    MRi in conjunction with rising CA 19-9 are indicative of relapse of disease.    01/09/2017 Progression    CT C/A/P: 2.1 x 2.9 cm lesion in the pancreatic tail and abutting the splenic hilum, corresponding to known primary pancreatic neoplasm, mildly increased.  Scattered small hypoenhancing lesions in the liver measuring up  to 10 mm, approximately 8-10 in number, suspicious for hepatic metastases.  No findings specific for metastatic disease in the chest.  Additional ancillary findings as above.     01/27/2017 - 10/20/2017 Chemotherapy    The patient had gemcitabine (GEMZAR) 1,292 mg in sodium chloride 0.9 % 250 mL chemo infusion, 800 mg/m2 = 1,292 mg, Intravenous,  Once, 9 of 12 cycles Administration: 1,292 mg (01/28/2017), 1,292 mg (02/11/2017), 1,292 mg (02/25/2017), 1,292 mg (03/11/2017), 1,292 mg (03/25/2017), 1,292 mg (04/08/2017), 1,292 mg (04/22/2017), 1,292 mg (04/29/2017), 1,292 mg (05/20/2017), 1,292 mg (06/03/2017), 1,292 mg (06/24/2017), 1,292 mg (07/08/2017), 1,292 mg (07/29/2017), 1,292 mg (08/12/2017), 1,292 mg (08/26/2017), 1,292 mg (09/09/2017), 1,292 mg (09/23/2017), 1,292 mg (10/07/2017)  for chemotherapy treatment.     10/21/2017 -  Chemotherapy    The patient had leucovorin 700 mg in dextrose 5 % 250 mL infusion, 644 mg, Intravenous,  Once, 16 of 19 cycles Administration: 700 mg (10/21/2017), 700 mg (11/04/2017), 700 mg (11/18/2017), 700 mg (12/02/2017), 700 mg (12/16/2017), 700 mg (12/30/2017), 700 mg (01/13/2018), 700 mg (01/27/2018), 700 mg (02/10/2018), 700 mg (02/24/2018), 700 mg (03/10/2018), 700 mg (03/24/2018), 700 mg (04/28/2018), 700 mg (05/17/2018), 700 mg (05/31/2018) ondansetron (ZOFRAN) 8 mg in sodium chloride 0.9 % 50 mL IVPB, , Intravenous,  Once, 15 of 18 cycles Administration:  (11/04/2017),  (01/27/2018),  (02/10/2018),  (02/24/2018),  (03/10/2018),  (03/24/2018),  (04/28/2018),  (05/17/2018),  (05/31/2018) fluorouracil (ADRUCIL) 3,850 mg in sodium chloride 0.9 % 73 mL chemo infusion, 2,400 mg/m2 = 3,850 mg, Intravenous, 1 Day/Dose, 16 of 19 cycles Dose modification: 1,920 mg/m2 (original dose 2,400 mg/m2, Cycle 2, Reason: Provider Judgment, Comment: mucisitis) Administration: 3,850 mg (10/21/2017), 3,100 mg (11/04/2017), 3,100 mg (11/18/2017), 3,100 mg (12/02/2017), 3,100 mg (12/16/2017), 3,100 mg (12/30/2017),  3,100 mg (01/13/2018), 3,100 mg (01/27/2018), 3,100 mg (02/10/2018), 3,100 mg (02/24/2018), 3,100 mg (03/10/2018), 3,100 mg (03/24/2018), 3,100 mg (04/28/2018), 3,100 mg (05/17/2018), 3,100 mg (05/31/2018) irinotecan LIPOSOME (ONIVYDE) 81.7 mg in sodium chloride 0.9 % 500 mL chemo infusion, 50 mg/m2 = 81.7 mg (100 % of original dose 50 mg/m2), Intravenous, Once, 16 of 19 cycles Dose modification: 50 mg/m2 (original dose 50 mg/m2, Cycle 1, Reason: Provider Judgment), 50 mg/m2 (original dose 50 mg/m2, Cycle 2, Reason: Provider Judgment) Administration: 81.7 mg (10/21/2017), 81.7 mg (11/04/2017), 81.7 mg (11/18/2017), 81.7 mg (12/02/2017), 81.7 mg (12/16/2017), 81.7 mg (12/30/2017), 81.7 mg (01/13/2018), 81.7 mg (01/27/2018), 81.7 mg (02/10/2018), 81.7 mg (02/24/2018), 81.7 mg (03/10/2018), 81.7 mg (03/24/2018), 81.7 mg (04/28/2018), 81.7 mg (05/17/2018), 81.7 mg (05/31/2018)  for chemotherapy treatment.       CANCER STAGING: Cancer Staging Pancreatic cancer metastasized to liver [facility]) Staging form: Pancreas, AJCC 7th Edition - Clinical: Stage IV (T3, N1, M1) - Signed by Baird Cancer, PA-C on 03/16/2014 - Pathologic: No stage assigned - Unsigned    INTERVAL HISTORY:  [name] 85 y.o. female returns for routine follow-up for pancreatic cancer to the liver. She report her right knee has been hurting her. She also has swelling at night in her legs and ankles. Her appetite is decreased. Denies  any nausea, vomiting, or diarrhea. Denies any new pains. Had not noticed any recent bleeding such as epistaxis, hematuria or hematochezia. Denies recent chest pain on exertion, shortness of breath on minimal exertion, pre-syncopal episodes, or palpitations. Denies any numbness or tingling in hands or feet. Denies any recent fevers, infections, or recent hospitalizations. Patient reports appetite at 100% and energy level at 100%.    REVIEW OF SYSTEMS:  Review of Systems  Cardiovascular: Positive for leg swelling.   Genitourinary: Positive for difficulty urinating.   All other systems reviewed and are negative.    PAST MEDICAL/SURGICAL HISTORY:  Past Medical History:  Diagnosis Date  . Anemia due to antineoplastic chemotherapy 09/12/2015   Started Aranesp 500 mcg on 09/12/2015  . Anxiety   . Chronic diarrhea   . Depression   . DNR (do not resuscitate) 08/16/2015  . Erosive esophagitis   . GERD (gastroesophageal reflux disease)   . Hx of adenomatous colonic polyps    tubular adenomas, last found in 2008  . Hyperplastic colon polyp 03/19/10   tcs by Dr. Gala Romney  . Hypertension 20 years   . Kidney stone    hx/ crushed   . Opioid contract exists 04/18/2015   With Dr. Moshe Cipro  . Pancreatic cancer ([place]) 02/2014  . Pancreatic cancer metastasized to liver ([place]) 03/10/2014  . Schatzki's ring 08/26/10   Last dilated on EGD by Dr. Trevor Iha [facility], linear gastric erosions, BI hemigastrectomy   Past Surgical History:  Procedure Laterality Date  . BREAST LUMPECTOMY Left   . bunion removal     from both feet   . CHOLECYSTECTOMY  1965   . COLONOSCOPY  03/19/2010   DR Gala Romney,, normal TI, pancolonic diverticula, random colon bx neg., hyperplastic polyps removed  . ESOPHAGOGASTRODUODENOSCOPY  11/22/2003   DR Gala Romney, erosive RE, Billroth I  . ESOPHAGOGASTRODUODENOSCOPY  08/26/10   Dr. Gala Romney- moderate severe ERE, Scahtzki ring s/p dilation, Billroth I, linear gastric erosions, bx-gastric xanthelasma  . LEFT SHOULDER SURGERY  2009   DR HARRISON  . ORIF ANKLE FRACTURE Right 05/22/2013   Procedure: OPEN REDUCTION INTERNAL FIXATION (ORIF) RIGHT ANKLE FRACTURE;  Surgeon: Sanjuana Kava, MD;  Location: [facility];  Service: Orthopedics;  Laterality: Right;  . stomach ulcer  50 years ago    had some of her stomach removed      SOCIAL HISTORY:  Social History   Socioeconomic History  . Marital status: Widowed    Spouse name: Not on file  . Number of children: 2  . Years of education: 70  . Highest education level:  12th grade  Occupational History  . Occupation: retired from Estate manager/land agent: RETIRED  Social Needs  . Financial resource strain: Not hard at all  . Food insecurity:    Worry: Never true    Inability: Never true  . Transportation needs:    Medical: No    Non-medical: No  Tobacco Use  . Smoking status: Current Some Day Smoker    Packs/day: 0.50    Years: 60.00    Pack years: 30.00    Types: Cigarettes    Last attempt to quit: 09/09/2016    Years since quitting: 1.7  . Smokeless tobacco: Never Used  Substance and Sexual Activity  . Alcohol use: No  . Drug use: No  . Sexual activity: Not Currently  Lifestyle  . Physical activity:    Days per week: 3 days    Minutes per session: 30 min  . Stress: Not  at all  Relationships  . Social connections:    Talks on phone: More than three times a week    Gets together: Three times a week    Attends religious service: 1 to 4 times per year    Active member of club or organization: No    Attends meetings of clubs or organizations: Never    Relationship status: Widowed  . Intimate partner violence:    Fear of current or ex partner: No    Emotionally abused: No    Physically abused: No    Forced sexual activity: No  Other Topics Concern  . Not on file  Social History Narrative   Lives with son alone     FAMILY HISTORY:  Family History  Problem Relation Age of Onset  . Hypertension Mother   . Kidney failure Brother        on dialysis  . Diabetes Sister   . Diabetes Sister   . Hypertension Father   . Kidney failure Sister   . Kidney Stones Brother   . Breast cancer Son   . Gout Son   . Liver disease Neg Hx   . Colon cancer Neg Hx     CURRENT MEDICATIONS:  Outpatient Encounter Medications as of 06/16/2018  Medication Sig  . acetaminophen (TYLENOL) 500 MG tablet Take 500 mg by mouth every 6 (six) hours as needed for mild pain or moderate pain.   Marland Kitchen amLODipine (NORVASC) 10 MG tablet Take 1 tablet (10 mg total) by mouth  daily.  . clonazePAM (KLONOPIN) 0.5 MG tablet Take one tablet daily for anxiety  . cyclobenzaprine (FLEXERIL) 5 MG tablet TK 1 T PO QHS FOR 7 DAYS THEN PRF PAIN  . cycloSPORINE (RESTASIS) 0.05 % ophthalmic emulsion Place 1 drop into both eyes 2 (two) times daily.  . diphenhydrAMINE (BENADRYL) 50 MG tablet Take 1 tablet 1 hour prior to CT  . fluorometholone (FML) 0.1 % ophthalmic suspension INSTILL 1 DROP INTO EACH EYE THREE TIMES DAILY FOR 14 DAYS  . lidocaine-prilocaine (EMLA) cream Apply 1 application topically as needed. Apply to portacath site as needed  . metoprolol tartrate (LOPRESSOR) 25 MG tablet Take one tablet once daily for blood pressure  . mirabegron ER (MYRBETRIQ) 25 MG TB24 tablet Take 1 tablet (25 mg total) by mouth daily.  . pantoprazole (PROTONIX) 20 MG tablet Take 1 tablet by mouth daily  . traMADol (ULTRAM) 50 MG tablet Take one tablet once daily, as needed, for uncontrolled pain  . [DISCONTINUED] prochlorperazine (COMPAZINE) 10 MG tablet Take 1 tablet (10 mg total) by mouth every 6 (six) hours as needed (Nausea or vomiting).   Facility-Administered Encounter Medications as of 06/16/2018  Medication  . dexamethasone (DECADRON) 10 mg in sodium chloride 0.9 % 50 mL IVPB  . sodium chloride flush (NS) 0.9 % injection 10 mL    ALLERGIES:  Allergies  Allergen Reactions  . Iohexol Hives and Shortness Of Breath    **Patient can not have IV contrast, pt has breakthrough reaction even after 13 hour premeds** Do not give IV contrast PATIENT HAD TO BE TAKEN TO ED, C/O WHELPS, HIVES, DIFFICULTY BREATHING. PATIENT GIVEN IV CONTRAST AFTER 13 HOUR PRE MEDS ON 01/09/2017 WITH NO REACTION NOTED. On 10/19/17 patient had reaction despite premedication.    Marland Kitchen Aciphex [Rabeprazole Sodium] Other (See Comments)    unknown  . Amlodipine Besylate-Valsartan     Rash to high-dose (5/320)  . Esomeprazole Magnesium   . Omeprazole Other (See Comments)  Patient states "medication didn't work"  .  Ciprofloxacin Rash  . Penicillins Swelling and Rash    Has patient had a PCN reaction causing immediate rash, facial/tongue/throat swelling, SOB or lightheadedness with hypotension: Yes Has patient had a PCN reaction causing severe rash involving mucus membranes or skin necrosis: Yes Has patient had a PCN reaction that required hospitalization: No Has patient had a PCN reaction occurring within the last 10 years: yes If all of the above answers are "NO", then may proceed with Cephalosporin use.      PHYSICAL EXAM:  ECOG Performance status: 1  Vitals:   06/16/18 0900  BP: (!) 152/53  Pulse: (!) 49  Resp: 14  Temp: 98.1 F (36.7 C)  SpO2: 100%   Filed Weights   06/16/18 0900  Weight: 124 lb 2 oz (56.3 kg)    Physical Exam Constitutional:      Appearance: Normal appearance. She is normal weight.  Cardiovascular:     Rate and Rhythm: Normal rate and regular rhythm.     Heart sounds: Normal heart sounds.  Pulmonary:     Effort: Pulmonary effort is normal.     Breath sounds: Normal breath sounds.  Musculoskeletal: Normal range of motion.  Skin:    General: Skin is warm and dry.  Neurological:     Mental Status: She is alert and oriented to person, place, and time. Mental status is at baseline.  Psychiatric:        Mood and Affect: Mood normal.        Behavior: Behavior normal.        Thought Content: Thought content normal.        Judgment: Judgment normal.      LABORATORY DATA:  I have reviewed the labs as listed.  CBC    Component Value Date/Time   WBC 4.5 06/16/2018 0838   RBC 3.74 (L) 06/16/2018 0838   HGB 10.7 (L) 06/16/2018 0838   HCT 33.8 (L) 06/16/2018 0838   PLT 133 (L) 06/16/2018 0838   MCV 90.4 06/16/2018 0838   MCH 28.6 06/16/2018 0838   MCHC 31.7 06/16/2018 0838   RDW 16.3 (H) 06/16/2018 0838   LYMPHSABS 1.2 06/16/2018 0838   MONOABS 0.3 06/16/2018 0838   EOSABS 0.4 06/16/2018 0838   BASOSABS 0.0 06/16/2018 0838   CMP Latest Ref Rng & Units  06/16/2018 05/31/2018 05/17/2018  Glucose 70 - 99 mg/dL 115(H) 103(H) 107(H)  BUN 8 - 23 mg/dL 17 32(H) 22  Creatinine 0.44 - 1.00 mg/dL 1.29(H) 1.61(H) 1.45(H)  Sodium 135 - 145 mmol/L 139 139 138  Potassium 3.5 - 5.1 mmol/L 4.1 4.6 4.0  Chloride 98 - 111 mmol/L 106 105 106  CO2 22 - 32 mmol/L _0 Calcium 8.9 - 10.3 mg/dL 8.6(L) 8.6(L) 8.3(L)  Total Protein 6.5 - 8.1 g/dL 6.8 6.9 6.6  Total Bilirubin 0.3 - 1.2 mg/dL 0.6 0.6 0.5  Alkaline Phos 38 - 126 U/L 141(H) 139(H) 149(H)  AST 15 - 41 U/L _1 ALT 0 - 44 U/L _2 DIAGNOSTIC IMAGING:  I have independently reviewed the scans and discussed with the patient.   I have reviewed Francene Finders, NP's note and agree with the documentation.  I personally performed a face-to-face visit, made revisions and my assessment and plan is as follows.    ASSESSMENT & PLAN:   Pancreatic cancer metastasized to liver ([place]) 1.  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 28 days started on 01/28/2017 -CT scan done on 07/13/2017 shows stable liver disease. CA 19-9 was on 06/24/2017 at 129.  CA-19-9 at last visit went up to 171. - I discussed the latest CT scan reports which showed progression in the liver lesions which are very subtle.  There is progression in the tail of the pancreas lesion measuring 27 x 27 mm.  Even though the progression is very small, her CA 19-9 is continuing to go up, last one at 171.  Hence I have recommended a change in her treatment.  Gemcitabine and Tarceva were discontinued. - First cycle of infusional 5-FU and liposomal Irinotecan started on 10/21/2017, Onivyde started at a decreased dose of 50 mg/m.  The dose of infusional 5-FU was reduced by 20% after cycle 1 secondary to  mucositis. - Cycle 6 chemotherapy was on 12/30/2017.  She is tolerating it very well.  We did reviewed the results of CEA which has come down to 206 on 12/30/2017.  This was 316 in June. - CT CAP on 01/04/2018 without contrast shows stable mass in the pancreatic tail and less distinctively seen hepatic metastasis.  - She has completed cycle 12 on 03/24/2018.  She is tolerating it very well except some minor side effects including hyperpigmentation of the skin. - She is continuing to get response with her tumor marker trending down from 316 prior to start of therapy to 110 on 03/10/2018. -CT CAP on 04/16/2018 done without contrast showed suggestion of a new lesion in the segment 4 of the liver measuring 1.3 cm.  This was vaguely present on prior scan.  Pancreatic tail lesion is stable.  She cannot get contrast due to allergy. -We discussed her blood work.  She is continuing to tolerate her chemotherapy very well.  She finished cycle 15 on 05/31/2018. - Her CA 19-9 has gone up to 123 2 weeks ago.  It was in the 110 range prior to that. - She may proceed with her cycle 16 today.  I plan to repeat CT CAP without contrast prior to next visit.  She cannot get contrast because of allergy. -We will see her back in 2 weeks for follow-up.  2.  Weight loss: -Her weight is stable around 124 for the past couple of months.  3.  Genetic testing: -She will be referred for BRCA 1/2 testing.  We will also consider MSI testing.      Orders placed this encounter:  Orders Placed This Encounter  Procedures  . CT Abdomen Pelvis Wo Contrast  . CT Chest Wo Contrast  . Magnesium  . CBC with Differential/Platelet  . Comprehensive metabolic panel  . Lactate dehydrogenase  . Cancer antigen 19-9      Derek Jack, MD [place] [phone]

## 2018-06-16 NOTE — Assessment & Plan Note (Signed)
1.  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 28 days started on 01/28/2017 -CT scan done on 07/13/2017 shows stable liver disease. CA 19-9 was on 06/24/2017 at 129.  CA-19-9 at last visit went up to 171. - I discussed the latest CT scan reports which showed progression in the liver lesions which are very subtle.  There is progression in the tail of the pancreas lesion measuring 27 x 27 mm.  Even though the progression is very small, her CA 19-9 is continuing to go up, last one at 171.  Hence I have recommended a change in her treatment.  Gemcitabine and Tarceva were discontinued. - First cycle of infusional 5-FU and liposomal Irinotecan started on 10/21/2017, Onivyde started at a decreased dose of 50 mg/m.  The dose of infusional 5-FU was reduced by 20% after cycle 1 secondary to mucositis. - Cycle 6 chemotherapy was on 12/30/2017.  She is tolerating it very well.  We did reviewed the results of CEA which has come down to 206 on 12/30/2017.  This was 316 in June. - CT CAP on 01/04/2018 without contrast shows stable mass in the pancreatic tail and less distinctively seen hepatic metastasis.  - She has completed cycle 12 on 03/24/2018.  She is tolerating it very well except some minor side effects including hyperpigmentation of the skin. - She is continuing to get response with her tumor marker trending down from 316 prior to start of therapy to 110 on 03/10/2018. -CT CAP on 04/16/2018 done without contrast showed suggestion of a new lesion in the segment 4 of the liver measuring 1.3 cm.  This was vaguely present on prior scan.  Pancreatic tail lesion is stable.  She cannot get contrast due to allergy. -We discussed her blood work.  She is continuing to tolerate her  chemotherapy very well.  She finished cycle 15 on 05/31/2018. - Her CA 19-9 has gone up to 123 2 weeks ago.  It was in the 110 range prior to that. - She may proceed with her cycle 16 today.  I plan to repeat CT CAP without contrast prior to next visit.  She cannot get contrast because of allergy. -We will see her back in 2 weeks for follow-up.  2.  Weight loss: -Her weight is stable around 124 for the past couple of months.  3.  Genetic testing: -She will be referred for BRCA 1/2 testing.  We will also consider MSI testing.

## 2018-06-16 NOTE — Patient Instructions (Signed)
[facility] at [facility] Discharge Instructions     Thank you for choosing  Cancer Center at [facility] to provide your oncology and hematology care.  To afford each patient quality time with our provider, please arrive at least 15 minutes before your scheduled appointment time.   If you have a lab appointment with the Cancer Center please come in thru the  Main Entrance and check in at the main information desk  You need to re-schedule your appointment should you arrive 10 or more minutes late.  We strive to give you quality time with our providers, and arriving late affects you and other patients whose appointments are after yours.  Also, if you no show three or more times for appointments you may be dismissed from the clinic at the providers discretion.     Again, thank you for choosing [facility].  Our hope is that these requests will decrease the amount of time that you wait before being seen by our physicians.       _____________________________________________________________  Should you have questions after your visit to [facility], please contact our office at [phone] between the hours of 8:00 a.m. and 4:30 p.m.  Voicemails left after 4:00 p.m. will not be returned until the following business day.  For prescription refill requests, have your pharmacy contact our office and allow 72 hours.    Cancer Center Support Programs:   > Cancer Support Group  2nd Tuesday of the month 1pm-2pm, Journey Room    

## 2018-06-16 NOTE — Patient Instructions (Signed)
[facility] Discharge Instructions for Patients Receiving Chemotherapy  Today you received the following chemotherapy agents  If you develop nausea and vomiting that is not controlled by your nausea medication, call the clinic.   BELOW ARE SYMPTOMS THAT SHOULD BE REPORTED IMMEDIATELY:  *FEVER GREATER THAN 100.5 F  *CHILLS WITH OR WITHOUT FEVER  NAUSEA AND VOMITING THAT IS NOT CONTROLLED WITH YOUR NAUSEA MEDICATION  *UNUSUAL SHORTNESS OF BREATH  *UNUSUAL BRUISING OR BLEEDING  TENDERNESS IN MOUTH AND THROAT WITH OR WITHOUT PRESENCE OF ULCERS  *URINARY PROBLEMS  *BOWEL PROBLEMS  UNUSUAL RASH Items with * indicate a potential emergency and should be followed up as soon as possible.  Feel free to call the clinic should you have any questions or concerns. The clinic phone number is [phone].  Please show the CHEMO ALERT CARD at check-in to the Emergency Department and triage nurse.   

## 2018-06-16 NOTE — Progress Notes (Signed)
Patient seen by the oncologist with lab review and ok to treat today verbal order Dr. Delton Coombes.    Patient tolerated chemotherapy with no complaints voiced.  Port site clean and dry with no bruising or swelling noted at site.  Good blood return noted before and after administration of chemotherapy.  Chemotherapy pump connected with no alarms noted.  Patient left ambulatory with VSS and no s/s of distress noted.

## 2018-06-18 ENCOUNTER — Inpatient Hospital Stay (HOSPITAL_COMMUNITY): Payer: Medicare HMO

## 2018-06-18 ENCOUNTER — Encounter (HOSPITAL_COMMUNITY): Payer: Self-pay

## 2018-06-18 VITALS — BP 146/60 | HR 51 | Temp 97.6°F | Resp 18

## 2018-06-18 DIAGNOSIS — Z5111 Encounter for antineoplastic chemotherapy: Secondary | ICD-10-CM | POA: Diagnosis not present

## 2018-06-18 DIAGNOSIS — C259 Malignant neoplasm of pancreas, unspecified: Secondary | ICD-10-CM

## 2018-06-18 DIAGNOSIS — C787 Secondary malignant neoplasm of liver and intrahepatic bile duct: Secondary | ICD-10-CM | POA: Diagnosis not present

## 2018-06-18 MED ORDER — HEPARIN SOD (PORK) LOCK FLUSH 100 UNIT/ML IV SOLN
500.0000 [IU] | Freq: Once | INTRAVENOUS | Status: AC | PRN
  Administered 2018-06-18: 500 [IU]

## 2018-06-18 MED ORDER — SODIUM CHLORIDE 0.9% FLUSH
10.0000 mL | INTRAVENOUS | Status: DC | PRN
  Administered 2018-06-18: 10 mL
  Filled 2018-06-18: qty 10

## 2018-06-18 NOTE — Progress Notes (Signed)
Patients chemotherapy pump disconnected with no complaints voiced.  Good blood return noted.  No bruising or swelling noted at site.  Band aid applied.  VSs with discharge and left ambulatory with no s/s of distress noted.

## 2018-06-18 NOTE — Patient Instructions (Signed)
[facility]  Discharge Instructions:   _______________________________________________________________  Thank you for choosing [facility] at [facility] to provide your oncology and hematology care.  To afford each patient quality time with our providers, please arrive at least 15 minutes before your scheduled appointment.  You need to re-schedule your appointment if you arrive 10 or more minutes late.  We strive to give you quality time with our providers, and arriving late affects you and other patients whose appointments are after yours.  Also, if you no show three or more times for appointments you may be dismissed from the clinic.  Again, thank you for choosing [facility]. Our hope is that these requests will allow you access to exceptional care and in a timely manner. _______________________________________________________________  If you have questions after your visit, please contact our office at [phone] between the hours of 8:30 a.m. and 5:00 p.m. Voicemails left after 4:30 p.m. will not be returned until the following business day. _______________________________________________________________  For prescription refill requests, have your pharmacy contact our office. _______________________________________________________________  Recommendations made by the consultant and any test results will be sent to your referring physician. _______________________________________________________________ 

## 2018-06-23 DIAGNOSIS — C259 Malignant neoplasm of pancreas, unspecified: Secondary | ICD-10-CM | POA: Diagnosis not present

## 2018-06-23 DIAGNOSIS — C787 Secondary malignant neoplasm of liver and intrahepatic bile duct: Secondary | ICD-10-CM | POA: Diagnosis not present

## 2018-06-25 ENCOUNTER — Other Ambulatory Visit (HOSPITAL_COMMUNITY): Payer: Self-pay | Admitting: *Deleted

## 2018-06-28 ENCOUNTER — Other Ambulatory Visit (HOSPITAL_COMMUNITY): Payer: Medicare HMO

## 2018-06-28 ENCOUNTER — Ambulatory Visit (HOSPITAL_COMMUNITY)
Admission: RE | Admit: 2018-06-28 | Discharge: 2018-06-28 | Disposition: A | Discharge status: Home / Self Care | Payer: Medicare HMO | Source: Ambulatory Visit | Attending: Nurse Practitioner | Admitting: Nurse Practitioner

## 2018-06-28 DIAGNOSIS — C787 Secondary malignant neoplasm of liver and intrahepatic bile duct: Secondary | ICD-10-CM | POA: Insufficient documentation

## 2018-06-28 DIAGNOSIS — C259 Malignant neoplasm of pancreas, unspecified: Secondary | ICD-10-CM | POA: Diagnosis not present

## 2018-06-30 ENCOUNTER — Inpatient Hospital Stay (HOSPITAL_BASED_OUTPATIENT_CLINIC_OR_DEPARTMENT_OTHER): Payer: Medicare HMO | Admitting: Hematology

## 2018-06-30 ENCOUNTER — Inpatient Hospital Stay (HOSPITAL_COMMUNITY): Payer: Medicare HMO

## 2018-06-30 ENCOUNTER — Encounter (HOSPITAL_COMMUNITY): Payer: Self-pay | Admitting: Hematology

## 2018-06-30 VITALS — BP 133/61 | HR 56 | Temp 98.0°F | Resp 16 | Wt 121.6 lb

## 2018-06-30 DIAGNOSIS — C787 Secondary malignant neoplasm of liver and intrahepatic bile duct: Principal | ICD-10-CM

## 2018-06-30 DIAGNOSIS — R05 Cough: Secondary | ICD-10-CM

## 2018-06-30 DIAGNOSIS — C259 Malignant neoplasm of pancreas, unspecified: Secondary | ICD-10-CM

## 2018-06-30 DIAGNOSIS — R634 Abnormal weight loss: Secondary | ICD-10-CM

## 2018-06-30 DIAGNOSIS — R3915 Urgency of urination: Secondary | ICD-10-CM

## 2018-06-30 DIAGNOSIS — R1110 Vomiting, unspecified: Secondary | ICD-10-CM | POA: Diagnosis not present

## 2018-06-30 DIAGNOSIS — Z5111 Encounter for antineoplastic chemotherapy: Secondary | ICD-10-CM | POA: Diagnosis not present

## 2018-06-30 LAB — CBC WITH DIFFERENTIAL/PLATELET
Abs Immature Granulocytes: 0.01 10*3/uL (ref 0.00–0.07)
Basophils Absolute: 0 10*3/uL (ref 0.0–0.1)
Basophils Relative: 0 %
Eosinophils Absolute: 0.4 10*3/uL (ref 0.0–0.5)
Eosinophils Relative: 11 %
HCT: 33.2 % — ABNORMAL LOW (ref 36.0–46.0)
Hemoglobin: 10.6 g/dL — ABNORMAL LOW (ref 12.0–15.0)
Immature Granulocytes: 0 %
Lymphocytes Relative: 13 %
Lymphs Abs: 0.5 10*3/uL — ABNORMAL LOW (ref 0.7–4.0)
MCH: 28.9 pg (ref 26.0–34.0)
MCHC: 31.9 g/dL (ref 30.0–36.0)
MCV: 90.5 fL (ref 80.0–100.0)
Monocytes Absolute: 0.4 10*3/uL (ref 0.1–1.0)
Monocytes Relative: 10 %
Neutro Abs: 2.5 10*3/uL (ref 1.7–7.7)
Neutrophils Relative %: 66 %
Platelets: 117 10*3/uL — ABNORMAL LOW (ref 150–400)
RBC: 3.67 MIL/uL — ABNORMAL LOW (ref 3.87–5.11)
RDW: 16.3 % — ABNORMAL HIGH (ref 11.5–15.5)
WBC: 3.9 10*3/uL — AB (ref 4.0–10.5)
nRBC: 0 % (ref 0.0–0.2)

## 2018-06-30 LAB — COMPREHENSIVE METABOLIC PANEL
ALT: 12 U/L (ref 0–44)
AST: 17 U/L (ref 15–41)
Albumin: 3.5 g/dL (ref 3.5–5.0)
Alkaline Phosphatase: 136 U/L — ABNORMAL HIGH (ref 38–126)
Anion gap: 8 (ref 5–15)
BUN: 20 mg/dL (ref 8–23)
CO2: 26 mmol/L (ref 22–32)
Calcium: 8.5 mg/dL — ABNORMAL LOW (ref 8.9–10.3)
Chloride: 104 mmol/L (ref 98–111)
Creatinine, Ser: 1.56 mg/dL — ABNORMAL HIGH (ref 0.44–1.00)
GFR calc Af Amer: 34 mL/min — ABNORMAL LOW (ref >60)
GFR calc non Af Amer: 30 mL/min — ABNORMAL LOW (ref >60)
Glucose, Bld: 106 mg/dL — ABNORMAL HIGH (ref 70–99)
Potassium: 4 mmol/L (ref 3.5–5.1)
Sodium: 138 mmol/L (ref 135–145)
Total Bilirubin: 0.7 mg/dL (ref 0.3–1.2)
Total Protein: 6.8 g/dL (ref 6.5–8.1)

## 2018-06-30 LAB — LACTATE DEHYDROGENASE: LDH: 168 U/L (ref 98–192)

## 2018-06-30 LAB — MAGNESIUM: Magnesium: 2.1 mg/dL (ref 1.7–2.4)

## 2018-06-30 MED ORDER — SODIUM CHLORIDE 0.9 % IV SOLN
1920.0000 mg/m2 | INTRAVENOUS | Status: DC
  Administered 2018-06-30: 3100 mg via INTRAVENOUS
  Filled 2018-06-30: qty 62

## 2018-06-30 MED ORDER — MIRABEGRON ER 50 MG PO TB24: 50.0000 mg | ORAL_TABLET | Freq: Every day | ORAL | 4 refills | Status: DC

## 2018-06-30 MED ORDER — SODIUM CHLORIDE 0.9% FLUSH
10.0000 mL | INTRAVENOUS | Status: DC | PRN
  Administered 2018-06-30: 10 mL
  Filled 2018-06-30: qty 10

## 2018-06-30 MED ORDER — SODIUM CHLORIDE 0.9 % IV SOLN
Freq: Once | INTRAVENOUS | Status: AC
  Administered 2018-06-30: 11:00:00 via INTRAVENOUS
  Filled 2018-06-30: qty 4

## 2018-06-30 MED ORDER — BENZONATATE 100 MG PO CAPS: 100.0000 mg | ORAL_CAPSULE | Freq: Two times a day (BID) | ORAL | 0 refills | Status: DC

## 2018-06-30 MED ORDER — SODIUM CHLORIDE 0.9 % IV SOLN
50.0000 mg/m2 | Freq: Once | INTRAVENOUS | Status: AC
  Administered 2018-06-30: 81.7 mg via INTRAVENOUS
  Filled 2018-06-30: qty 19

## 2018-06-30 MED ORDER — SODIUM CHLORIDE 0.9 % IV SOLN
Freq: Once | INTRAVENOUS | Status: AC
  Administered 2018-06-30: 10:00:00 via INTRAVENOUS

## 2018-06-30 MED ORDER — LEUCOVORIN CALCIUM INJECTION 350 MG
700.0000 mg | Freq: Once | INTRAVENOUS | Status: AC
  Administered 2018-06-30: 700 mg via INTRAVENOUS
  Filled 2018-06-30: qty 35

## 2018-06-30 NOTE — Progress Notes (Signed)
Labs reviewed today with Dr. Delton Coombes at office visit. Proceed with treatment today per MD.   Continuous 57fU pump connected per protocol.   Treatment given per orders. Patient tolerated it well without problems. Vitals stable and discharged home from clinic ambulatory. Follow up as scheduled.

## 2018-06-30 NOTE — Assessment & Plan Note (Signed)
1.  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 28 days started on 01/28/2017 -CT scan done on 07/13/2017 shows stable liver disease. CA 19-9 was on 06/24/2017 at 129.  CA-19-9 at last visit went up to 171. - I discussed the latest CT scan reports which showed progression in the liver lesions which are very subtle.  There is progression in the tail of the pancreas lesion measuring 27 x 27 mm.  Even though the progression is very small, her CA 19-9 is continuing to go up, last one at 171.  Hence I have recommended a change in her treatment.  Gemcitabine and Tarceva were discontinued. - First cycle of infusional 5-FU and liposomal Irinotecan started on 10/21/2017, Onivyde started at a decreased dose of 50 mg/m.  The dose of infusional 5-FU was reduced by 20% after cycle 1 secondary to mucositis. - Cycle 6 chemotherapy was on 12/30/2017.  She is tolerating it very well.  We did reviewed the results of CEA which has come down to 206 on 12/30/2017.  This was 316 in June. - CT CAP on 01/04/2018 without contrast shows stable mass in the pancreatic tail and less distinctively seen hepatic metastasis.  - She has completed cycle 12 on 03/24/2018.  She is tolerating it very well except some minor side effects including hyperpigmentation of the skin. - She is continuing to get response with her tumor marker trending down from 316 prior to start of therapy to 110 on 03/10/2018. -CT CAP on 04/16/2018 done without contrast showed suggestion of a new lesion in the segment 4 of the liver measuring 1.3 cm.  This was vaguely present on prior scan.  Pancreatic tail lesion is stable.  She cannot get contrast due to allergy. - She completed 16 cycles on 06/16/2018. - We reviewed the results of the CT  CAP from 06/28/2018.  Stable appearance of focal thickening in the tail of the pancreas extending into the splenic hilum.  Liver metastasis are similar with no progression.  No new lesions. -We discussed continuation of chemotherapy as long as she tolerates well.  She seems to be doing well at this time. -Her CA 19-9 was 136 on 05/31/2018.  Level from today is pending. - We will plan to continue next cycle at the same reduced dose level.  She will come back in 2 weeks for her next cycle.  I will see her back in 4 weeks for follow-up. -She does report some dry cough started 2 to 3 days ago.  She is using Mucinex but it is not helping.  We will call in Tessalon Perles for 10 days.  2.  Weight loss: -Her weight has been stable around 124 for the last couple of months.  However in the last 2 weeks she lost about 2 to 3 pounds.  I have encouraged her to drink a can of Ensure every day.  3.  Genetic testing: -She will be referred for BRCA 1/2 testing.  We will also consider MSI testing. 

## 2018-06-30 NOTE — Progress Notes (Signed)
[name], [place] [id number]   CLINIC:  Medical Oncology/Hematology  PCP:  Fayrene Helper, MD [address] [place] [address] [phone]   REASON FOR VISIT: Follow-up for pancreatic cancer to the liver  CURRENT THERAPY:Infusional 5FU and liposomal Irinotecan  BRIEF ONCOLOGIC HISTORY:    Pancreatic cancer metastasized to liver ([place])   03/10/2014 Initial Diagnosis    Pancreatic cancer metastasized to liver    03/22/2014 - 03/05/2016 Chemotherapy    Abraxane/Gemzar days 1, 8, every 28 days.  Day 15 was cancelled due to leukopenia and thrombocytopenia on day 15 cycle 1.    05/24/2014 Treatment Plan Change    Day 8 of cycle 3 is held with ANC of 1.1    06/05/2014 Imaging    CT C/A/P . Interval decrease in size of the pancreatic tail mass.Improved hepatic metastatic disease. No new lesions. No CT findings for metastatic disease involving the chest.    08/09/2014 Tumor Marker    CA 19-9= 33 (WNL)    10/26/2014 Imaging    MRI- Continued interval decrease in size of the hepatic metastatic lesions and no new lesions are identified. Continued decrease in size of the pancreatic tail lesion.    01/03/2015 Tumor Marker    Results for [name] (MRN [id number]) as of 01/18/2015 12:52  01/03/2015 10:00 CA 19-9: 14     01/17/2015 Imaging    MRI- Response to therapy of hepatic metastasis.  Similar size of a pancreatic tail lesion.    03/20/2015 Imaging    MRI-L spine- Severe disc space narrowing at L2-L3, with endplate reactive changes. Large disc extrusion into the ventral epidural space,central to the RIGHT with a cephalad migrated free fragment. Additional Large disc extrusion into the retroperitone...    03/22/2015 Imaging    CT pelvis- No evidence for metastatic disease within the pelvis.    07/24/2015 Imaging    MRI abd- Continued response to therapy, with no residual detectable liver metastases. No new sites of  metastatic disease in the abdomen.     08/15/2015 Code Status     She confirms desire for DNR status.    12/21/2015 Imaging    MRI liver- Severe image degradation due to motion artifact reducing diagnostic sensitivity and specificity. Reduce conspicuity of the pancreatic tail lesion suggesting further improvement. The original liver lesions have resolved.    03/05/2016 Treatment Plan Change    Chemotherapy holiday/Break after 2 years worth of treatment    04/07/2016 Imaging    CT chest- When compared to recent chest CT, new minimally displaced anterior left sixth rib fracture. Slight increase in subcarinal adenopathy    04/28/2016 Imaging    Bone density- BMD as determined from Femur Neck Right is 0.705 g/cm2 with a T-Score of -2.4. This patient is considered osteopenic according to [facility]) criteria. Compared with the prior study on 02/23/2013, the BMD of the lumbar spine/rt. femoral neck show a statistically significant decrease.     05/23/2016 Imaging    MRI abd- 1. Exam is significantly degraded by patient respiratory motion. Consider follow-up exams with CT abdomen with without contrast per pancreatic protocol. 2. Fullness in tail of pancreas is more prominent and potentially increased in size. Recommend close attention on follow-up. (Consider CT as above) 3. No explanation for back pain.    09/11/2016 Imaging    MRI pancreas- Interval progression of pancreatic tail lesion. New differential perfusion right liver with areas of  heterogeneity. Underlying metastatic disease in this region not excluded.    09/11/2016 Progression    MRi in conjunction with rising CA 19-9 are indicative of relapse of disease.    01/09/2017 Progression    CT C/A/P: 2.1 x 2.9 cm lesion in the pancreatic tail and abutting the splenic hilum, corresponding to known primary pancreatic neoplasm, mildly increased.  Scattered small hypoenhancing lesions in the liver measuring up  to 10 mm, approximately 8-10 in number, suspicious for hepatic metastases.  No findings specific for metastatic disease in the chest.  Additional ancillary findings as above.     01/27/2017 - 10/20/2017 Chemotherapy    The patient had gemcitabine (GEMZAR) 1,292 mg in sodium chloride 0.9 % 250 mL chemo infusion, 800 mg/m2 = 1,292 mg, Intravenous,  Once, 9 of 12 cycles Administration: 1,292 mg (01/28/2017), 1,292 mg (02/11/2017), 1,292 mg (02/25/2017), 1,292 mg (03/11/2017), 1,292 mg (03/25/2017), 1,292 mg (04/08/2017), 1,292 mg (04/22/2017), 1,292 mg (04/29/2017), 1,292 mg (05/20/2017), 1,292 mg (06/03/2017), 1,292 mg (06/24/2017), 1,292 mg (07/08/2017), 1,292 mg (07/29/2017), 1,292 mg (08/12/2017), 1,292 mg (08/26/2017), 1,292 mg (09/09/2017), 1,292 mg (09/23/2017), 1,292 mg (10/07/2017)  for chemotherapy treatment.     10/21/2017 -  Chemotherapy    The patient had leucovorin 700 mg in dextrose 5 % 250 mL infusion, 644 mg, Intravenous,  Once, 17 of 19 cycles Administration: 700 mg (10/21/2017), 700 mg (11/04/2017), 700 mg (11/18/2017), 700 mg (12/02/2017), 700 mg (12/16/2017), 700 mg (12/30/2017), 700 mg (01/13/2018), 700 mg (01/27/2018), 700 mg (02/10/2018), 700 mg (02/24/2018), 700 mg (03/10/2018), 700 mg (03/24/2018), 700 mg (04/28/2018), 700 mg (05/17/2018), 700 mg (05/31/2018), 700 mg (06/16/2018) ondansetron (ZOFRAN) 8 mg in sodium chloride 0.9 % 50 mL IVPB, , Intravenous,  Once, 16 of 18 cycles Administration:  (11/04/2017),  (01/27/2018),  (02/10/2018),  (02/24/2018),  (03/10/2018),  (03/24/2018),  (04/28/2018),  (05/17/2018),  (05/31/2018),  (06/16/2018) fluorouracil (ADRUCIL) 3,850 mg in sodium chloride 0.9 % 73 mL chemo infusion, 2,400 mg/m2 = 3,850 mg, Intravenous, 1 Day/Dose, 17 of 19 cycles Dose modification: 1,920 mg/m2 (original dose 2,400 mg/m2, Cycle 2, Reason: Provider Judgment, Comment: mucisitis) Administration: 3,850 mg (10/21/2017), 3,100 mg (11/04/2017), 3,100 mg (11/18/2017), 3,100 mg (12/02/2017), 3,100 mg  (12/16/2017), 3,100 mg (12/30/2017), 3,100 mg (01/13/2018), 3,100 mg (01/27/2018), 3,100 mg (02/10/2018), 3,100 mg (02/24/2018), 3,100 mg (03/10/2018), 3,100 mg (03/24/2018), 3,100 mg (04/28/2018), 3,100 mg (05/17/2018), 3,100 mg (05/31/2018), 3,100 mg (06/16/2018) irinotecan LIPOSOME (ONIVYDE) 81.7 mg in sodium chloride 0.9 % 500 mL chemo infusion, 50 mg/m2 = 81.7 mg (100 % of original dose 50 mg/m2), Intravenous, Once, 17 of 19 cycles Dose modification: 50 mg/m2 (original dose 50 mg/m2, Cycle 1, Reason: Provider Judgment), 50 mg/m2 (original dose 50 mg/m2, Cycle 2, Reason: Provider Judgment) Administration: 81.7 mg (10/21/2017), 81.7 mg (11/04/2017), 81.7 mg (11/18/2017), 81.7 mg (12/02/2017), 81.7 mg (12/16/2017), 81.7 mg (12/30/2017), 81.7 mg (01/13/2018), 81.7 mg (01/27/2018), 81.7 mg (02/10/2018), 81.7 mg (02/24/2018), 81.7 mg (03/10/2018), 81.7 mg (03/24/2018), 81.7 mg (04/28/2018), 81.7 mg (05/17/2018), 81.7 mg (05/31/2018), 81.7 mg (06/16/2018)  for chemotherapy treatment.       CANCER STAGING: Cancer Staging Pancreatic cancer metastasized to liver [facility]) Staging form: Pancreas, AJCC 7th Edition - Clinical: Stage IV (T3, N1, M1) - Signed by Baird Cancer, PA-C on 03/16/2014 - Pathologic: No stage assigned - Unsigned    INTERVAL HISTORY:  [name] 85 y.o. female returns for routine follow-up for pancreatic cancer to the liver. She reports she has lost 3 pounds since her last visit due to her  taste buds are gone. She reports she has an appetite but can not taste the food. She vomited one time last night. Denies any nausea or diarrhea. Denies any new pains. Had not noticed any recent bleeding such as epistaxis, hematuria or hematochezia. Denies recent chest pain on exertion, shortness of breath on minimal exertion, pre-syncopal episodes, or palpitations. Denies any numbness or tingling in hands or feet. Denies any recent fevers, infections, or recent hospitalizations. Patient reports appetite at 50% and energy  level at 50%.   REVIEW OF SYSTEMS:  Review of Systems  Constitutional: Positive for fatigue.  Gastrointestinal: Positive for vomiting.  Skin: Positive for itching (scalp).  All other systems reviewed and are negative.    PAST MEDICAL/SURGICAL HISTORY:  Past Medical History:  Diagnosis Date  . Anemia due to antineoplastic chemotherapy 09/12/2015   Started Aranesp 500 mcg on 09/12/2015  . Anxiety   . Chronic diarrhea   . Depression   . DNR (do not resuscitate) 08/16/2015  . Erosive esophagitis   . GERD (gastroesophageal reflux disease)   . Hx of adenomatous colonic polyps    tubular adenomas, last found in 2008  . Hyperplastic colon polyp 03/19/10   tcs by Dr. Gala Romney  . Hypertension 20 years   . Kidney stone    hx/ crushed   . Opioid contract exists 04/18/2015   With Dr. Moshe Cipro  . Pancreatic cancer ([place]) 02/2014  . Pancreatic cancer metastasized to liver ([place]) 03/10/2014  . Schatzki's ring 08/26/10   Last dilated on EGD by Dr. Trevor Iha [facility], linear gastric erosions, BI hemigastrectomy   Past Surgical History:  Procedure Laterality Date  . BREAST LUMPECTOMY Left   . bunion removal     from both feet   . CHOLECYSTECTOMY  1965   . COLONOSCOPY  03/19/2010   DR Gala Romney,, normal TI, pancolonic diverticula, random colon bx neg., hyperplastic polyps removed  . ESOPHAGOGASTRODUODENOSCOPY  11/22/2003   DR Gala Romney, erosive RE, Billroth I  . ESOPHAGOGASTRODUODENOSCOPY  08/26/10   Dr. Gala Romney- moderate severe ERE, Scahtzki ring s/p dilation, Billroth I, linear gastric erosions, bx-gastric xanthelasma  . LEFT SHOULDER SURGERY  2009   DR HARRISON  . ORIF ANKLE FRACTURE Right 05/22/2013   Procedure: OPEN REDUCTION INTERNAL FIXATION (ORIF) RIGHT ANKLE FRACTURE;  Surgeon: Sanjuana Kava, MD;  Location: [facility];  Service: Orthopedics;  Laterality: Right;  . stomach ulcer  50 years ago    had some of her stomach removed      SOCIAL HISTORY:  Social History   Socioeconomic History  . Marital  status: Widowed    Spouse name: Not on file  . Number of children: 2  . Years of education: 48  . Highest education level: 12th grade  Occupational History  . Occupation: retired from Estate manager/land agent: RETIRED  Social Needs  . Financial resource strain: Not hard at all  . Food insecurity:    Worry: Never true    Inability: Never true  . Transportation needs:    Medical: No    Non-medical: No  Tobacco Use  . Smoking status: Current Some Day Smoker    Packs/day: 0.50    Years: 60.00    Pack years: 30.00    Types: Cigarettes    Last attempt to quit: 09/09/2016    Years since quitting: 1.8  . Smokeless tobacco: Never Used  Substance and Sexual Activity  . Alcohol use: No  . Drug use: No  . Sexual activity: Not Currently  Lifestyle  .  Physical activity:    Days per week: 3 days    Minutes per session: 30 min  . Stress: Not at all  Relationships  . Social connections:    Talks on phone: More than three times a week    Gets together: Three times a week    Attends religious service: 1 to 4 times per year    Active member of club or organization: No    Attends meetings of clubs or organizations: Never    Relationship status: Widowed  . Intimate partner violence:    Fear of current or ex partner: No    Emotionally abused: No    Physically abused: No    Forced sexual activity: No  Other Topics Concern  . Not on file  Social History Narrative   Lives with son alone     FAMILY HISTORY:  Family History  Problem Relation Age of Onset  . Hypertension Mother   . Kidney failure Brother        on dialysis  . Diabetes Sister   . Diabetes Sister   . Hypertension Father   . Kidney failure Sister   . Kidney Stones Brother   . Breast cancer Son   . Gout Son   . Liver disease Neg Hx   . Colon cancer Neg Hx     CURRENT MEDICATIONS:  Outpatient Encounter Medications as of 06/30/2018  Medication Sig  . acetaminophen (TYLENOL) 500 MG tablet Take 500 mg by mouth every 6  (six) hours as needed for mild pain or moderate pain.   Marland Kitchen amLODipine (NORVASC) 10 MG tablet Take 1 tablet (10 mg total) by mouth daily.  . clonazePAM (KLONOPIN) 0.5 MG tablet Take one tablet daily for anxiety  . cycloSPORINE (RESTASIS) 0.05 % ophthalmic emulsion Place 1 drop into both eyes 2 (two) times daily.  . metoprolol tartrate (LOPRESSOR) 25 MG tablet Take one tablet once daily for blood pressure  . mirabegron ER (MYRBETRIQ) 50 MG TB24 tablet Take 1 tablet (50 mg total) by mouth daily.  . pantoprazole (PROTONIX) 20 MG tablet Take 1 tablet by mouth daily  . traMADol (ULTRAM) 50 MG tablet Take one tablet once daily, as needed, for uncontrolled pain  . [DISCONTINUED] cyclobenzaprine (FLEXERIL) 5 MG tablet TK 1 T PO QHS FOR 7 DAYS THEN PRF PAIN  . [DISCONTINUED] mirabegron ER (MYRBETRIQ) 25 MG TB24 tablet Take 1 tablet (25 mg total) by mouth daily.  . benzonatate (TESSALON) 100 MG capsule Take 1 capsule (100 mg total) by mouth 2 (two) times daily.  . diphenhydrAMINE (BENADRYL) 50 MG tablet Take 1 tablet 1 hour prior to CT (Patient not taking: Reported on 06/30/2018)  . fluorometholone (FML) 0.1 % ophthalmic suspension INSTILL 1 DROP INTO EACH EYE THREE TIMES DAILY FOR 14 DAYS  . lidocaine-prilocaine (EMLA) cream Apply 1 application topically as needed. Apply to portacath site as needed (Patient not taking: Reported on 06/30/2018)  . [DISCONTINUED] prochlorperazine (COMPAZINE) 10 MG tablet Take 1 tablet (10 mg total) by mouth every 6 (six) hours as needed (Nausea or vomiting).   Facility-Administered Encounter Medications as of 06/30/2018  Medication  . sodium chloride flush (NS) 0.9 % injection 10 mL    ALLERGIES:  Allergies  Allergen Reactions  . Iohexol Hives and Shortness Of Breath    **Patient can not have IV contrast, pt has breakthrough reaction even after 13 hour premeds** Do not give IV contrast PATIENT HAD TO BE TAKEN TO ED, C/O WHELPS, HIVES, DIFFICULTY BREATHING. PATIENT  GIVEN IV  CONTRAST AFTER 13 HOUR PRE MEDS ON 01/09/2017 WITH NO REACTION NOTED. On 10/19/17 patient had reaction despite premedication.    Marland Kitchen Aciphex [Rabeprazole Sodium] Other (See Comments)    unknown  . Amlodipine Besylate-Valsartan     Rash to high-dose (5/320)  . Esomeprazole Magnesium   . Omeprazole Other (See Comments)    Patient states "medication didn't work"  . Ciprofloxacin Rash  . Penicillins Swelling and Rash    Has patient had a PCN reaction causing immediate rash, facial/tongue/throat swelling, SOB or lightheadedness with hypotension: Yes Has patient had a PCN reaction causing severe rash involving mucus membranes or skin necrosis: Yes Has patient had a PCN reaction that required hospitalization: No Has patient had a PCN reaction occurring within the last 10 years: yes If all of the above answers are "NO", then may proceed with Cephalosporin use.      PHYSICAL EXAM:  ECOG Performance status: 1  Vitals:   06/30/18 0850  BP: 133/61  Pulse: (!) 56  Resp: 16  Temp: 98 F (36.7 C)  SpO2: 100%   Filed Weights   06/30/18 0850  Weight: 121 lb 9.6 oz (55.2 kg)    Physical Exam Constitutional:      Appearance: Normal appearance. She is normal weight.  Cardiovascular:     Rate and Rhythm: Normal rate and regular rhythm.     Heart sounds: Normal heart sounds.  Pulmonary:     Effort: Pulmonary effort is normal.     Breath sounds: Normal breath sounds.  Musculoskeletal: Normal range of motion.  Skin:    General: Skin is warm and dry.  Neurological:     Mental Status: She is alert and oriented to person, place, and time. Mental status is at baseline.  Psychiatric:        Mood and Affect: Mood normal.        Behavior: Behavior normal.        Thought Content: Thought content normal.        Judgment: Judgment normal.   Abdomen: No palpable masses.  Nontender.   LABORATORY DATA:  I have reviewed the labs as listed.  CBC    Component Value Date/Time   WBC 3.9 (L)  06/30/2018 0819   RBC 3.67 (L) 06/30/2018 0819   HGB 10.6 (L) 06/30/2018 0819   HCT 33.2 (L) 06/30/2018 0819   PLT 117 (L) 06/30/2018 0819   MCV 90.5 06/30/2018 0819   MCH 28.9 06/30/2018 0819   MCHC 31.9 06/30/2018 0819   RDW 16.3 (H) 06/30/2018 0819   LYMPHSABS 0.5 (L) 06/30/2018 0819   MONOABS 0.4 06/30/2018 0819   EOSABS 0.4 06/30/2018 0819   BASOSABS 0.0 06/30/2018 0819   CMP Latest Ref Rng & Units 06/30/2018 06/16/2018 05/31/2018  Glucose 70 - 99 mg/dL 106(H) 115(H) 103(H)  BUN 8 - 23 mg/dL 20 17 32(H)  Creatinine 0.44 - 1.00 mg/dL 1.56(H) 1.29(H) 1.61(H)  Sodium 135 - 145 mmol/L 138 139 139  Potassium 3.5 - 5.1 mmol/L 4.0 4.1 4.6  Chloride 98 - 111 mmol/L 104 106 105  CO2 22 - 32 mmol/L '26 27 26  ' Calcium 8.9 - 10.3 mg/dL 8.5(L) 8.6(L) 8.6(L)  Total Protein 6.5 - 8.1 g/dL 6.8 6.8 6.9  Total Bilirubin 0.3 - 1.2 mg/dL 0.7 0.6 0.6  Alkaline Phos 38 - 126 U/L 136(H) 141(H) 139(H)  AST 15 - 41 U/L '17 15 17  ' ALT 0 - 44 U/L 12 10 12  DIAGNOSTIC IMAGING:  I have independently reviewed the scans and discussed with the patient.   I have reviewed Francene Finders, NP's note and agree with the documentation.  I personally performed a face-to-face visit, made revisions and my assessment and plan is as follows.    ASSESSMENT & PLAN:   Pancreatic cancer metastasized to liver ([place]) 1.  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 28 days started on 01/28/2017 -CT scan done on 07/13/2017 shows stable liver disease. CA 19-9 was on 06/24/2017 at 129.  CA-19-9 at last visit went up to 171. - I discussed the latest CT scan reports which showed progression in the liver lesions which are very subtle.  There is progression in the tail of the pancreas lesion  measuring 27 x 27 mm.  Even though the progression is very small, her CA 19-9 is continuing to go up, last one at 171.  Hence I have recommended a change in her treatment.  Gemcitabine and Tarceva were discontinued. - First cycle of infusional 5-FU and liposomal Irinotecan started on 10/21/2017, Onivyde started at a decreased dose of 50 mg/m.  The dose of infusional 5-FU was reduced by 20% after cycle 1 secondary to mucositis. - Cycle 6 chemotherapy was on 12/30/2017.  She is tolerating it very well.  We did reviewed the results of CEA which has come down to 206 on 12/30/2017.  This was 316 in June. - CT CAP on 01/04/2018 without contrast shows stable mass in the pancreatic tail and less distinctively seen hepatic metastasis.  - She has completed cycle 12 on 03/24/2018.  She is tolerating it very well except some minor side effects including hyperpigmentation of the skin. - She is continuing to get response with her tumor marker trending down from 316 prior to start of therapy to 110 on 03/10/2018. -CT CAP on 04/16/2018 done without contrast showed suggestion of a new lesion in the segment 4 of the liver measuring 1.3 cm.  This was vaguely present on prior scan.  Pancreatic tail lesion is stable.  She cannot get contrast due to allergy. - She completed 16 cycles on 06/16/2018. - We reviewed the results of the CT CAP from 06/28/2018.  Stable appearance of focal thickening in the tail of the pancreas extending into the splenic hilum.  Liver metastasis are similar with no progression.  No new lesions. -We discussed continuation of chemotherapy as long as she tolerates well.  She seems to be doing well at this time. -Her CA 19-9 was 136 on 05/31/2018.  Level from today is pending. - We will plan to continue next cycle at the same reduced dose level.  She will come back in 2 weeks for her next cycle.  I will see her back in 4 weeks for follow-up. -She does report some dry cough started 2 to 3 days ago.  She is using  Mucinex but it is not helping.  We will call in [place] for 10 days.  2.  Weight loss: -Her weight has been stable around 124 for the last couple of months.  However in the last 2 weeks she lost about 2 to 3 pounds.  I have encouraged her to drink a can of Ensure every day.  3.  Genetic testing: -She  will be referred for BRCA 1/2 testing.  We will also consider MSI testing.      Orders placed this encounter:  Orders Placed This Encounter  Procedures  . Magnesium  . CBC with Differential/Platelet  . Comprehensive metabolic panel  . Lactate dehydrogenase  . Cancer antigen 19-9      Derek Jack, MD [place] [phone]

## 2018-06-30 NOTE — Patient Instructions (Signed)
[facility] at [facility] Discharge Instructions     Thank you for choosing [facility] at [facility] to provide your oncology and hematology care.  To afford each patient quality time with our provider, please arrive at least 15 minutes before your scheduled appointment time.   If you have a lab appointment with the Cancer Center please come in thru the  Main Entrance and check in at the main information desk  You need to re-schedule your appointment should you arrive 10 or more minutes late.  We strive to give you quality time with our providers, and arriving late affects you and other patients whose appointments are after yours.  Also, if you no show three or more times for appointments you may be dismissed from the clinic at the providers discretion.     Again, thank you for choosing [facility].  Our hope is that these requests will decrease the amount of time that you wait before being seen by our physicians.       _____________________________________________________________  Should you have questions after your visit to [facility], please contact our office at [phone] between the hours of 8:00 a.m. and 4:30 p.m.  Voicemails left after 4:00 p.m. will not be returned until the following business day.  For prescription refill requests, have your pharmacy contact our office and allow 72 hours.    Cancer Center Support Programs:   > Cancer Support Group  2nd Tuesday of the month 1pm-2pm, Journey Room    

## 2018-07-01 LAB — CANCER ANTIGEN 19-9: CA 19-9: 127 U/mL — ABNORMAL HIGH (ref 0–35)

## 2018-07-02 ENCOUNTER — Inpatient Hospital Stay (HOSPITAL_COMMUNITY): Payer: Medicare HMO

## 2018-07-02 ENCOUNTER — Encounter (HOSPITAL_COMMUNITY): Payer: Self-pay

## 2018-07-02 VITALS — BP 137/56 | HR 58 | Temp 99.1°F | Resp 18

## 2018-07-02 DIAGNOSIS — C259 Malignant neoplasm of pancreas, unspecified: Secondary | ICD-10-CM | POA: Diagnosis not present

## 2018-07-02 DIAGNOSIS — Z5111 Encounter for antineoplastic chemotherapy: Secondary | ICD-10-CM | POA: Diagnosis not present

## 2018-07-02 DIAGNOSIS — C787 Secondary malignant neoplasm of liver and intrahepatic bile duct: Secondary | ICD-10-CM | POA: Diagnosis not present

## 2018-07-02 MED ORDER — HEPARIN SOD (PORK) LOCK FLUSH 100 UNIT/ML IV SOLN
500.0000 [IU] | Freq: Once | INTRAVENOUS | Status: AC | PRN
  Administered 2018-07-02: 500 [IU]

## 2018-07-02 MED ORDER — SODIUM CHLORIDE 0.9% FLUSH
10.0000 mL | INTRAVENOUS | Status: DC | PRN
  Administered 2018-07-02: 10 mL
  Filled 2018-07-02: qty 10

## 2018-07-02 NOTE — Progress Notes (Signed)
Georgenia Heyne tolerated 5FU pump well without complaints or incident. 5FU pump discontinued with portacath flushed with 10 ml NS and 5 ml Heparin easily per protocol then de-accessed. VSS Pt discharged self ambulatory using her cane in satisfactory condition accompanied by family member

## 2018-07-02 NOTE — Patient Instructions (Signed)
[facility] at [facility] Discharge Instructions  5FU pump discontinued with portacath flushed per protocol today. Follow-up as scheduled. Call clinic for any questions or concerns    Thank you for choosing  [facility] Center at [facility] to provide your oncology and hematology care.  To afford each patient quality time with our provider, please arrive at least 15 minutes before your scheduled appointment time.   If you have a lab appointment with the Cancer Center please come in thru the  Main Entrance and check in at the main information desk  You need to re-schedule your appointment should you arrive 10 or more minutes late.  We strive to give you quality time with our providers, and arriving late affects you and other patients whose appointments are after yours.  Also, if you no show three or more times for appointments you may be dismissed from the clinic at the providers discretion.     Again, thank you for choosing [facility].  Our hope is that these requests will decrease the amount of time that you wait before being seen by our physicians.       _____________________________________________________________  Should you have questions after your visit to [facility], please contact our office at [phone] between the hours of 8:00 a.m. and 4:30 p.m.  Voicemails left after 4:00 p.m. will not be returned until the following business day.  For prescription refill requests, have your pharmacy contact our office and allow 72 hours.    Cancer Center Support Programs:   > Cancer Support Group  2nd Tuesday of the month 1pm-2pm, Journey Room   

## 2018-07-08 ENCOUNTER — Ambulatory Visit (INDEPENDENT_AMBULATORY_CARE_PROVIDER_SITE_OTHER): Payer: Medicare HMO | Admitting: Family Medicine

## 2018-07-08 ENCOUNTER — Encounter: Payer: Self-pay | Admitting: Family Medicine

## 2018-07-08 VITALS — BP 140/78 | HR 63 | Resp 16 | Ht 63.0 in | Wt 121.4 lb

## 2018-07-08 DIAGNOSIS — R05 Cough: Secondary | ICD-10-CM | POA: Diagnosis not present

## 2018-07-08 DIAGNOSIS — I10 Essential (primary) hypertension: Secondary | ICD-10-CM

## 2018-07-08 DIAGNOSIS — R058 Other specified cough: Secondary | ICD-10-CM

## 2018-07-08 DIAGNOSIS — T7840XA Allergy, unspecified, initial encounter: Secondary | ICD-10-CM | POA: Diagnosis not present

## 2018-07-08 NOTE — Patient Instructions (Signed)
    Thank you for coming into the office today. I appreciate the opportunity to provide you with the care for your allergy.  Please stop using the Tessalon perles.  Continue use of Benadryl as needed (following package directions).  Avoid fruit cups until itching is better.  If not improved by Monday call office. We might need to send you to the allergist to get testing.  For cough continue Robitussin (without the decongestant). If this does not improve let us know.  Drink plenty of water to stay hydrated and get fresh air daily.   It was a pleasure to see you and I look forward to continuing to work together on your health and well-being. Please do not hesitate to call the office if you need care or have questions about your care.  Hope you feel less itchy soon!  Have a wonderful day and week.  With Gratitude,  Cherly Beach, DNP, AGNP-BC

## 2018-07-08 NOTE — Progress Notes (Signed)
Acute Office Visit  Subjective:    Patient ID: [name], female    DOB: [birth year], 85 y.o.   MRN: [id number]  Chief Complaint  Patient presents with  . Itching around face and chest    fruit allergy in past     HPI Ms. Arredondo, 85 year old female well-known to the clinic patient of Dr. Moshe Cipro presents today with a questionable fruit allergy from the past.  Has itching around face chest into the scalp.  Has experienced this before many years back when her husband would bring her fresh fruit from [place].  Fruit consisted of fresh oranges and tangerines.  She never developed a rash when eating these in the past.  She had stopped eating them until recently.  When she has started eating fruit cups and fruit cocktail prepackaged.  Last week several days she ate some of the fruit cups and started noticing the itching around her forehead face without a rash.  Was itching so bad on her chest that she was scratching herself well.  Only modifying factor was oral Benadryl 25 mg every 4 hours as directed on the box.  Denies having experienced this in the last 10 years.  Reports that she eats these fruit cups pretty regularly.  Reports that it was only citrus fruits in the past that she had issues with.  Denies having difficulty with swallowing, tongue swelling, mouth itching or throat itching.  Denied having breathing difficulties or shortness of breath.  Denied having itching in arms upper back or back of neck.  She reports that she stopped eating the fruit cup after this started.  But continues to experience the itching without the rash.  Reports that the Benadryl curbed the itching need but then it returns again.  Still itching as of today.  Took Benadryl this morning.  Denies having any new cleaners, soaps,'s body wash, shampoos.  Endorses being started on a new medication for cough.  Tessalon Perles was started on 06/30/2018.  When asked does report correlation with the itching starting the same  time that she started taking these Tessalon Perles.  Reports that she is still taking them daily.  For cough.  Cough seems to be getting better.  Is taking Robitussin.  Unsure if Robitussin has a decongestant in at this time.  Past medical history, past surgical history, past social history, allergies, medications reviewed.   Past Medical History:  Diagnosis Date  . Anemia due to antineoplastic chemotherapy 09/12/2015   Started Aranesp 500 mcg on 09/12/2015  . Anxiety   . Chronic diarrhea   . Depression   . DNR (do not resuscitate) 08/16/2015  . Erosive esophagitis   . GERD (gastroesophageal reflux disease)   . Hx of adenomatous colonic polyps    tubular adenomas, last found in 2008  . Hyperplastic colon polyp 03/19/10   tcs by Dr. Gala Romney  . Hypertension 20 years   . Kidney stone    hx/ crushed   . Opioid contract exists 04/18/2015   With Dr. Moshe Cipro  . Pancreatic cancer ([place]) 02/2014  . Pancreatic cancer metastasized to liver ([place]) 03/10/2014  . Schatzki's ring 08/26/10   Last dilated on EGD by Dr. Trevor Iha [facility], linear gastric erosions, BI hemigastrectomy    Past Surgical History:  Procedure Laterality Date  . BREAST LUMPECTOMY Left   . bunion removal     from both feet   . CHOLECYSTECTOMY  1965   . COLONOSCOPY  03/19/2010   DR Gala Romney,, normal TI, pancolonic  diverticula, random colon bx neg., hyperplastic polyps removed  . ESOPHAGOGASTRODUODENOSCOPY  11/22/2003   DR Gala Romney, erosive RE, Billroth I  . ESOPHAGOGASTRODUODENOSCOPY  08/26/10   Dr. Gala Romney- moderate severe ERE, Scahtzki ring s/p dilation, Billroth I, linear gastric erosions, bx-gastric xanthelasma  . LEFT SHOULDER SURGERY  2009   DR HARRISON  . ORIF ANKLE FRACTURE Right 05/22/2013   Procedure: OPEN REDUCTION INTERNAL FIXATION (ORIF) RIGHT ANKLE FRACTURE;  Surgeon: Sanjuana Kava, MD;  Location: [facility];  Service: Orthopedics;  Laterality: Right;  . stomach ulcer  50 years ago    had some of her stomach removed     Family  History  Problem Relation Age of Onset  . Hypertension Mother   . Kidney failure Brother        on dialysis  . Diabetes Sister   . Diabetes Sister   . Hypertension Father   . Kidney failure Sister   . Kidney Stones Brother   . Breast cancer Son   . Gout Son   . Liver disease Neg Hx   . Colon cancer Neg Hx     Social History   Socioeconomic History  . Marital status: Widowed    Spouse name: Not on file  . Number of children: 2  . Years of education: 41  . Highest education level: 12th grade  Occupational History  . Occupation: retired from Estate manager/land agent: RETIRED  Social Needs  . Financial resource strain: Not hard at all  . Food insecurity:    Worry: Never true    Inability: Never true  . Transportation needs:    Medical: No    Non-medical: No  Tobacco Use  . Smoking status: Current Some Day Smoker    Packs/day: 0.50    Years: 60.00    Pack years: 30.00    Types: Cigarettes    Last attempt to quit: 09/09/2016    Years since quitting: 1.8  . Smokeless tobacco: Never Used  Substance and Sexual Activity  . Alcohol use: No  . Drug use: No  . Sexual activity: Not Currently  Lifestyle  . Physical activity:    Days per week: 3 days    Minutes per session: 30 min  . Stress: Not at all  Relationships  . Social connections:    Talks on phone: More than three times a week    Gets together: Three times a week    Attends religious service: 1 to 4 times per year    Active member of club or organization: No    Attends meetings of clubs or organizations: Never    Relationship status: Widowed  . Intimate partner violence:    Fear of current or ex partner: No    Emotionally abused: No    Physically abused: No    Forced sexual activity: No  Other Topics Concern  . Not on file  Social History Narrative   Lives with son alone     Outpatient Medications Prior to Visit  Medication Sig Dispense Refill  . acetaminophen (TYLENOL) 500 MG tablet Take 500 mg by mouth  every 6 (six) hours as needed for mild pain or moderate pain.     Marland Kitchen amLODipine (NORVASC) 10 MG tablet Take 1 tablet (10 mg total) by mouth daily. 90 tablet 2  . clonazePAM (KLONOPIN) 0.5 MG tablet Take one tablet daily for anxiety 90 tablet 0  . cycloSPORINE (RESTASIS) 0.05 % ophthalmic emulsion Place 1 drop into both eyes 2 (two) times  daily.    . diphenhydrAMINE (BENADRYL) 50 MG tablet Take 1 tablet 1 hour prior to CT 1 tablet 0  . fluorometholone (FML) 0.1 % ophthalmic suspension INSTILL 1 DROP INTO EACH EYE THREE TIMES DAILY FOR 14 DAYS  2  . lidocaine-prilocaine (EMLA) cream Apply 1 application topically as needed. Apply to portacath site as needed 30 g 0  . metoprolol tartrate (LOPRESSOR) 25 MG tablet Take one tablet once daily for blood pressure 90 tablet 3  . mirabegron ER (MYRBETRIQ) 50 MG TB24 tablet Take 1 tablet (50 mg total) by mouth daily. 30 tablet 4  . pantoprazole (PROTONIX) 20 MG tablet Take 1 tablet by mouth daily 90 tablet 2  . traMADol (ULTRAM) 50 MG tablet Take one tablet once daily, as needed, for uncontrolled pain 30 tablet 0  . benzonatate (TESSALON) 100 MG capsule Take 1 capsule (100 mg total) by mouth 2 (two) times daily. 60 capsule 0   Facility-Administered Medications Prior to Visit  Medication Dose Route Frequency Provider Last Rate Last Dose  . sodium chloride flush (NS) 0.9 % injection 10 mL  10 mL Intracatheter PRN Derek Jack, MD   10 mL at 03/26/18 1150    Allergies  Allergen Reactions  . Iohexol Hives and Shortness Of Breath    **Patient can not have IV contrast, pt has breakthrough reaction even after 13 hour premeds** Do not give IV contrast PATIENT HAD TO BE TAKEN TO ED, C/O WHELPS, HIVES, DIFFICULTY BREATHING. PATIENT GIVEN IV CONTRAST AFTER 13 HOUR PRE MEDS ON 01/09/2017 WITH NO REACTION NOTED. On 10/19/17 patient had reaction despite premedication.    Marland Kitchen Aciphex [Rabeprazole Sodium] Other (See Comments)    unknown  . Amlodipine  Besylate-Valsartan     Rash to high-dose (5/320)  . Esomeprazole Magnesium   . Omeprazole Other (See Comments)    Patient states "medication didn't work"  . Ciprofloxacin Rash  . Penicillins Swelling and Rash    Has patient had a PCN reaction causing immediate rash, facial/tongue/throat swelling, SOB or lightheadedness with hypotension: Yes Has patient had a PCN reaction causing severe rash involving mucus membranes or skin necrosis: Yes Has patient had a PCN reaction that required hospitalization: No Has patient had a PCN reaction occurring within the last 10 years: yes If all of the above answers are "NO", then may proceed with Cephalosporin use.   [name] [Benzonatate] Itching    Full head, face, neck, and chest itching.     Review of Systems  Constitutional: Negative for chills, fever and malaise/fatigue.  HENT: Negative for congestion, ear pain, hearing loss, sinus pain and sore throat.   Eyes: Negative for photophobia and discharge.  Respiratory: Negative for cough, shortness of breath and wheezing.   Cardiovascular: Negative for chest pain, palpitations and leg swelling.  Gastrointestinal: Negative.   Genitourinary: Negative.   Musculoskeletal: Negative.   Skin: Positive for itching. Negative for rash.  Neurological: Negative for dizziness and headaches.  Endo/Heme/Allergies: Negative.   Psychiatric/Behavioral: The patient is not nervous/anxious and does not have insomnia.   All other systems reviewed and are negative.      Objective:    Physical Exam  Constitutional: She is oriented to person, place, and time. She appears well-developed and well-nourished.  HENT:  Head: Normocephalic.  Right Ear: Hearing and external ear normal.  Left Ear: Hearing and external ear normal.  Nose: Nose normal.  Mouth/Throat: Uvula is midline and oropharynx is clear and moist.  Eyes: Conjunctivae are normal. Right  eye exhibits no discharge. Left eye exhibits no discharge.    Neck: Normal range of motion. Neck supple.  Prominent right sternal clavicle joint.   Cardiovascular: Normal rate, regular rhythm and normal heart sounds.  Pulmonary/Chest: Effort normal and breath sounds normal. No respiratory distress.  Noted chemotherapy port right upper chest.  Abdominal: Soft. Bowel sounds are normal.  Musculoskeletal: Normal range of motion.  Lymphadenopathy:    She has no cervical adenopathy.  Neurological: She is alert and oriented to person, place, and time.  Skin: Skin is warm and dry. No rash noted.  Psychiatric: She has a normal mood and affect. Her behavior is normal. Judgment and thought content normal.  Nursing note and vitals reviewed.   BP 140/78   Pulse 63   Resp 16   Ht 5\' 3"  (1.6 m)   Wt 121 lb 6.4 oz (55.1 kg)   BMI 21.51 kg/m  Wt Readings from Last 3 Encounters:  07/08/18 121 lb 6.4 oz (55.1 kg)  06/30/18 121 lb 9.6 oz (55.2 kg)  06/16/18 124 lb 2 oz (56.3 kg)    There are no preventive care reminders to display for this patient.  There are no preventive care reminders to display for this patient.   Lab Results  Component Value Date   TSH 0.721 10/06/2016   Lab Results  Component Value Date   WBC 3.9 (L) 06/30/2018   HGB 10.6 (L) 06/30/2018   HCT 33.2 (L) 06/30/2018   MCV 90.5 06/30/2018   PLT 117 (L) 06/30/2018   Lab Results  Component Value Date   NA 138 06/30/2018   K 4.0 06/30/2018   CO2 26 06/30/2018   GLUCOSE 106 (H) 06/30/2018   BUN 20 06/30/2018   CREATININE 1.56 (H) 06/30/2018   BILITOT 0.7 06/30/2018   ALKPHOS 136 (H) 06/30/2018   AST 17 06/30/2018   ALT 12 06/30/2018   PROT 6.8 06/30/2018   ALBUMIN 3.5 06/30/2018   CALCIUM 8.5 (L) 06/30/2018   ANIONGAP 8 06/30/2018   Lab Results  Component Value Date   CHOL 161 02/14/2014   Lab Results  Component Value Date   HDL 60 02/14/2014   Lab Results  Component Value Date   LDLCALC 81 02/14/2014   Lab Results  Component Value Date   TRIG 99  02/14/2014   Lab Results  Component Value Date   CHOLHDL 2.7 02/14/2014   Lab Results  Component Value Date   HGBA1C 6.0 (H) 11/09/2013       Assessment & Plan:   1. Allergic state, initial encounter Patient had new onset of itching through scalp face neck and chest.  Had experienced something like this before in the past related to fresh citrus fruits.  Has not had this issue in over 10 years.  Denied having anything new recently outside of started on Tessalon Perles about 24 hours within the timeframe of the initial itching occurring.  Had never been on [employer] before that she knows of.  Advised to discontinue this at this time.  Reported that Robitussin was working for cough just as well.  Advised to take Benadryl tonight and into the next day as needed.  For the continued itching until medication is fully out of system.  Should itching not go away advised to call back to the clinic.  Might need an allergy testing referral.  Could possibly do a low-dose steroid pack.  Hopefully with the discontinuation of the Tessalon Perles the Benadryl will take care of  the extra itching until Tessalon is completely out of system.  Tessalon Perles were added to her list of allergies has a low in severity secondary to having onset of systemic itching.  2. Dry cough Dry cough was prescribed Tessalon Perles last week.  Has been experiencing itching since then.  Thought it was fruit cup allergy.  See HPI.  For further information.  Advised to stop taking Tessalon Perles.  Advised to continue Robitussin as long as it does not have a decongestant in it.  Encouraged to get regular Robitussin without decongestant if there is a decongestant in the 1 that they currently have.  Educated on why decongestants or not good for people with high blood pressure.  3. Essential hypertension Blood pressure today in the office is stable.  Not deteriorated.  Please see above as far as education goes.  Cough medicine  use.   Follow-up as needed.  Perlie Mayo, NP

## 2018-07-12 DIAGNOSIS — Z961 Presence of intraocular lens: Secondary | ICD-10-CM | POA: Diagnosis not present

## 2018-07-12 DIAGNOSIS — H16223 Keratoconjunctivitis sicca, not specified as Sjogren's, bilateral: Secondary | ICD-10-CM | POA: Diagnosis not present

## 2018-07-14 ENCOUNTER — Telehealth: Payer: Self-pay | Admitting: Family Medicine

## 2018-07-14 ENCOUNTER — Inpatient Hospital Stay (HOSPITAL_COMMUNITY): Payer: Medicare HMO | Attending: Hematology

## 2018-07-14 ENCOUNTER — Telehealth: Payer: Self-pay

## 2018-07-14 ENCOUNTER — Inpatient Hospital Stay (HOSPITAL_COMMUNITY): Payer: Medicare HMO

## 2018-07-14 DIAGNOSIS — Z5111 Encounter for antineoplastic chemotherapy: Secondary | ICD-10-CM | POA: Insufficient documentation

## 2018-07-14 DIAGNOSIS — C787 Secondary malignant neoplasm of liver and intrahepatic bile duct: Secondary | ICD-10-CM | POA: Insufficient documentation

## 2018-07-14 DIAGNOSIS — C259 Malignant neoplasm of pancreas, unspecified: Secondary | ICD-10-CM | POA: Diagnosis present

## 2018-07-14 DIAGNOSIS — T7840XA Allergy, unspecified, initial encounter: Secondary | ICD-10-CM

## 2018-07-14 DIAGNOSIS — C252 Malignant neoplasm of tail of pancreas: Secondary | ICD-10-CM | POA: Insufficient documentation

## 2018-07-14 LAB — COMPREHENSIVE METABOLIC PANEL
ALBUMIN: 3.5 g/dL (ref 3.5–5.0)
ALT: 12 U/L (ref 0–44)
ANION GAP: 8 (ref 5–15)
AST: 14 U/L — ABNORMAL LOW (ref 15–41)
Alkaline Phosphatase: 153 U/L — ABNORMAL HIGH (ref 38–126)
BUN: 22 mg/dL (ref 8–23)
CO2: 24 mmol/L (ref 22–32)
Calcium: 8.2 mg/dL — ABNORMAL LOW (ref 8.9–10.3)
Chloride: 107 mmol/L (ref 98–111)
Creatinine, Ser: 1.58 mg/dL — ABNORMAL HIGH (ref 0.44–1.00)
GFR calc Af Amer: 34 mL/min — ABNORMAL LOW (ref >60)
GFR calc non Af Amer: 29 mL/min — ABNORMAL LOW (ref >60)
Glucose, Bld: 106 mg/dL — ABNORMAL HIGH (ref 70–99)
Potassium: 3.5 mmol/L (ref 3.5–5.1)
Sodium: 139 mmol/L (ref 135–145)
Total Bilirubin: 0.7 mg/dL (ref 0.3–1.2)
Total Protein: 6.6 g/dL (ref 6.5–8.1)

## 2018-07-14 LAB — CBC WITH DIFFERENTIAL/PLATELET
Abs Immature Granulocytes: 0 10*3/uL (ref 0.00–0.07)
BASOS ABS: 0 10*3/uL (ref 0.0–0.1)
Basophils Relative: 1 %
Eosinophils Absolute: 0.4 10*3/uL (ref 0.0–0.5)
Eosinophils Relative: 13 %
HCT: 31.1 % — ABNORMAL LOW (ref 36.0–46.0)
Hemoglobin: 10 g/dL — ABNORMAL LOW (ref 12.0–15.0)
Immature Granulocytes: 0 %
Lymphocytes Relative: 33 %
Lymphs Abs: 0.9 10*3/uL (ref 0.7–4.0)
MCH: 28.7 pg (ref 26.0–34.0)
MCHC: 32.2 g/dL (ref 30.0–36.0)
MCV: 89.4 fL (ref 80.0–100.0)
Monocytes Absolute: 0.3 10*3/uL (ref 0.1–1.0)
Monocytes Relative: 11 %
Neutro Abs: 1.2 10*3/uL — ABNORMAL LOW (ref 1.7–7.7)
Neutrophils Relative %: 42 %
Platelets: 104 10*3/uL — ABNORMAL LOW (ref 150–400)
RBC: 3.48 MIL/uL — ABNORMAL LOW (ref 3.87–5.11)
RDW: 16.3 % — ABNORMAL HIGH (ref 11.5–15.5)
WBC: 2.7 10*3/uL — ABNORMAL LOW (ref 4.0–10.5)
nRBC: 0 % (ref 0.0–0.2)

## 2018-07-14 LAB — MAGNESIUM: Magnesium: 2 mg/dL (ref 1.7–2.4)

## 2018-07-14 NOTE — Patient Instructions (Signed)
Neutropenia Neutropenia is a condition that occurs when you have a lower-than-normal level of a type of white blood cell (neutrophil) in your body. Neutrophils are made in the spongy center of large bones (bone marrow) and they fight infections. Neutrophils are your body's main defense against bacterial and fungal infections. The fewer neutrophils you have and the longer your body remains without them, the greater your risk of getting a severe infection. What are the causes? This condition can occur if your body uses up or destroys neutrophils faster than your bone marrow can make them. This problem may happen because of:  Bacterial or fungal infection.  Allergic disorders.  Reactions to some medicines.  Autoimmune disease.  An enlarged spleen. This condition can also occur if your bone marrow does not produce enough neutrophils. This problem may be caused by:  Cancer.  Cancer treatments, such as radiation or chemotherapy.  Viral infections.  Medicines, such as phenytoin.  Vitamin B12 deficiency.  Diseases of the bone marrow.  Environmental toxins, such as insecticides. What are the signs or symptoms? This condition does not usually cause symptoms. If symptoms are present, they are usually caused by an underlying infection. Symptoms of an infection may include:  Fever.  Chills.  Swollen glands.  Oral or anal ulcers.  Cough and shortness of breath.  Rash.  Skin infection.  Fatigue. How is this diagnosed? Your health care provider may suspect neutropenia if you have:  A condition that may cause neutropenia.  Symptoms of infection, especially fever.  Frequent and unusual infections. You will have a medical history and physical exam. Tests will also be done, such as:  A complete blood count (CBC).  A procedure to collect a sample of bone marrow for examination (bone marrow biopsy).  A chest X-ray.  A urine culture.  A blood culture. How is this  treated? Treatment depends on the underlying cause and severity of your condition. Mild neutropenia may not require treatment. Treatment may include medicines, such as:  Antibiotic medicine given through an IV tube.  Antiviral medicines.  Antifungal medicines.  A medicine to increase neutrophil production (colony-stimulating factor). You may get this drug through an IV tube or by injection.  Steroids given through an IV tube. If an underlying condition is causing neutropenia, you may need treatment for that condition. If medicines you are taking are causing neutropenia, your health care provider may have you stop taking those medicines. Follow these instructions at home: Medicines   Take over-the-counter and prescription medicines only as told by your health care provider.  Get a seasonal flu shot (influenza vaccine). Lifestyle  Do not eat unpasteurized foods.Do not eat unwashed raw fruits or vegetables.  Avoid exposure to groups of people or children.  Avoid being around people who are sick.  Avoid being around dirt or dust, such as in construction areas or gardens.  Do not provide direct care for pets. Avoid animal droppings. Do not clean litter boxes and bird cages. Hygiene   Bathe daily.  Clean the area between the genitals and the anus (perineal area) after you urinate or have a bowel movement. If you are female, wipe from front to back.  Brush your teeth with a soft toothbrush before and after meals.  Do not use a razor that has a blade. Use an electric razor to remove hair.  Wash your hands often. Make sure others who come in contact with you also wash their hands. If soap and water are not available, use hand   sanitizer. General instructions  Do not have sex unless your health care provider has approved.  Take actions to avoid cuts and burns. For example: ? Be cautious when you use knives. Always cut away from yourself. ? Keep knives in protective sheaths or  guards when not in use. ? Use oven mitts when you cook with a hot stove, oven, or grill. ? Stand a safe distance away from open fires.  Avoid people who received a vaccine in the past 30 days if that vaccine contained a live version of the germ (live vaccine). You should not get a live vaccine. Common live vaccines are varicella, measles, mumps, and rubella.  Do not share food utensils.  Do not use tampons, enemas, or rectal suppositories unless your health care provider has approved.  Keep all appointments as told by your health care provider. This is important. Contact a health care provider if:  You have a fever.  You have chills or you start to shake.  You have: ? A sore throat. ? A warm, red, or tender area on your skin. ? A cough. ? Frequent or painful urination. ? Vaginal discharge or itching.  You develop: ? Sores in your mouth or anus. ? Swollen lymph nodes. ? Red streaks on the skin. ? A rash.  You feel: ? Nauseous or you vomit. ? Very fatigued. ? Short of breath. This information is not intended to replace advice given to you by your health care provider. Make sure you discuss any questions you have with your health care provider. Document Released: 10/18/2001 Document Revised: 12/23/2017 Document Reviewed: 11/08/2014 Elsevier Interactive Patient Education  2019 [employer].

## 2018-07-14 NOTE — Telephone Encounter (Signed)
Tried to call patient to let her know referral has been placed. No answer, no vm. Will try again later.

## 2018-07-14 NOTE — Telephone Encounter (Signed)
Please advise 

## 2018-07-14 NOTE — Progress Notes (Signed)
Reviewed ANC 1.2 with Dr. Delton Coombes with verbal order to hold treatment until next week.  Reviewed neutropenia precautions with the patient with understanding verbalized.

## 2018-07-14 NOTE — Telephone Encounter (Signed)
Referral placed for dermatology due to allergic reaction. Requested appt for [facility] office.

## 2018-07-14 NOTE — Telephone Encounter (Signed)
Pt is calling in she is still broke out, would like a referral to a Specialist, Early morning in [place], any day but Wed

## 2018-07-14 NOTE — Telephone Encounter (Signed)
Tried to speak with patient to let her know referral to dermatology has been entered. No answer, no vm. Will try again later.

## 2018-07-15 NOTE — Telephone Encounter (Signed)
Patient made aware of dermatology appt being placed and once referral has been worked someone will call her to make an appt

## 2018-07-16 ENCOUNTER — Encounter (HOSPITAL_COMMUNITY): Payer: Medicare HMO

## 2018-07-20 ENCOUNTER — Other Ambulatory Visit: Payer: Self-pay

## 2018-07-20 ENCOUNTER — Telehealth: Payer: Self-pay | Admitting: *Deleted

## 2018-07-20 MED ORDER — PREDNISONE 5 MG (21) PO TBPK: ORAL_TABLET | ORAL | 0 refills | Status: DC

## 2018-07-20 NOTE — Telephone Encounter (Signed)
Per Jarrett Soho, prednisone dose pak 5 mg 21 tabs called into [facility]

## 2018-07-20 NOTE — Telephone Encounter (Signed)
I called [name] to let her know that she had an appt with Dr. Nevada Crane for 3-17 for itching. She stated she would be crazy by then she is scratching and now has sores where she has scratched so much. Wanted to know if something could be called in or if she could be given a shot until she makes it to Dr. Nevada Crane.

## 2018-07-20 NOTE — Telephone Encounter (Signed)
Pts son called in [place] (on [place]) saying that Ms. Hout was itching. I let him know that I had spoke with Ms. Grewe and gave her an appt with Dr. Nevada Crane and that I also had sent a message over to the nurse to see if we could get her something in the mean time for the itching. Once the nurse received the message she would give them a call back and let them know. Son was very angry stating that he was not happy as they should just call the medicine in and he didn't understand why there was such a problem with getting medication.

## 2018-07-20 NOTE — Telephone Encounter (Signed)
Please advise. Patients son was upset that she needs something sent now. Was requesting to speak to office manager.

## 2018-07-20 NOTE — Telephone Encounter (Signed)
Spoke with Barbaraann Rondo as he was upset that nothing was going to be called in. I advised Barbaraann Rondo that the call was documented and sent over to the nurse. If Hannah-NP, advised that something could be called in then the nurse would have to take care of that. The Ladies answering the phone up front only take messages, and send the messages to the nurses.  Barbaraann Rondo calmed down slightly but was still irate.

## 2018-07-21 ENCOUNTER — Inpatient Hospital Stay (HOSPITAL_COMMUNITY): Payer: Medicare HMO

## 2018-07-21 ENCOUNTER — Encounter (HOSPITAL_COMMUNITY): Payer: Self-pay

## 2018-07-21 ENCOUNTER — Encounter (HOSPITAL_COMMUNITY): Payer: Self-pay | Admitting: Hematology

## 2018-07-21 ENCOUNTER — Other Ambulatory Visit: Payer: Self-pay

## 2018-07-21 ENCOUNTER — Inpatient Hospital Stay (HOSPITAL_BASED_OUTPATIENT_CLINIC_OR_DEPARTMENT_OTHER): Payer: Medicare HMO | Admitting: Hematology

## 2018-07-21 VITALS — BP 183/65 | HR 65 | Temp 98.0°F | Resp 16

## 2018-07-21 DIAGNOSIS — R634 Abnormal weight loss: Secondary | ICD-10-CM | POA: Diagnosis not present

## 2018-07-21 DIAGNOSIS — C259 Malignant neoplasm of pancreas, unspecified: Secondary | ICD-10-CM | POA: Diagnosis not present

## 2018-07-21 DIAGNOSIS — Z79899 Other long term (current) drug therapy: Secondary | ICD-10-CM | POA: Diagnosis not present

## 2018-07-21 DIAGNOSIS — C787 Secondary malignant neoplasm of liver and intrahepatic bile duct: Principal | ICD-10-CM

## 2018-07-21 DIAGNOSIS — Z87891 Personal history of nicotine dependence: Secondary | ICD-10-CM | POA: Diagnosis not present

## 2018-07-21 DIAGNOSIS — Z5111 Encounter for antineoplastic chemotherapy: Secondary | ICD-10-CM | POA: Diagnosis not present

## 2018-07-21 DIAGNOSIS — Z803 Family history of malignant neoplasm of breast: Secondary | ICD-10-CM | POA: Diagnosis not present

## 2018-07-21 LAB — CBC WITH DIFFERENTIAL/PLATELET
Abs Immature Granulocytes: 0.02 10*3/uL (ref 0.00–0.07)
Basophils Absolute: 0 10*3/uL (ref 0.0–0.1)
Basophils Relative: 1 %
EOS PCT: 4 %
Eosinophils Absolute: 0.2 10*3/uL (ref 0.0–0.5)
HCT: 31.2 % — ABNORMAL LOW (ref 36.0–46.0)
HEMOGLOBIN: 10.2 g/dL — AB (ref 12.0–15.0)
Immature Granulocytes: 1 %
LYMPHS PCT: 27 %
Lymphs Abs: 1.1 10*3/uL (ref 0.7–4.0)
MCH: 29.9 pg (ref 26.0–34.0)
MCHC: 32.7 g/dL (ref 30.0–36.0)
MCV: 91.5 fL (ref 80.0–100.0)
Monocytes Absolute: 0.7 10*3/uL (ref 0.1–1.0)
Monocytes Relative: 18 %
Neutro Abs: 1.9 10*3/uL (ref 1.7–7.7)
Neutrophils Relative %: 49 %
Platelets: 191 10*3/uL (ref 150–400)
RBC: 3.41 MIL/uL — AB (ref 3.87–5.11)
RDW: 16.7 % — ABNORMAL HIGH (ref 11.5–15.5)
WBC: 3.9 10*3/uL — ABNORMAL LOW (ref 4.0–10.5)
nRBC: 0 % (ref 0.0–0.2)

## 2018-07-21 LAB — COMPREHENSIVE METABOLIC PANEL
ALK PHOS: 141 U/L — AB (ref 38–126)
ALT: 11 U/L (ref 0–44)
AST: 16 U/L (ref 15–41)
Albumin: 3.4 g/dL — ABNORMAL LOW (ref 3.5–5.0)
Anion gap: 8 (ref 5–15)
BUN: 25 mg/dL — ABNORMAL HIGH (ref 8–23)
CO2: 27 mmol/L (ref 22–32)
Calcium: 8.7 mg/dL — ABNORMAL LOW (ref 8.9–10.3)
Chloride: 106 mmol/L (ref 98–111)
Creatinine, Ser: 1.69 mg/dL — ABNORMAL HIGH (ref 0.44–1.00)
GFR calc Af Amer: 31 mL/min — ABNORMAL LOW (ref >60)
GFR calc non Af Amer: 27 mL/min — ABNORMAL LOW (ref >60)
Glucose, Bld: 102 mg/dL — ABNORMAL HIGH (ref 70–99)
Potassium: 4.1 mmol/L (ref 3.5–5.1)
Sodium: 141 mmol/L (ref 135–145)
Total Bilirubin: 0.4 mg/dL (ref 0.3–1.2)
Total Protein: 6.8 g/dL (ref 6.5–8.1)

## 2018-07-21 LAB — MAGNESIUM: Magnesium: 2.2 mg/dL (ref 1.7–2.4)

## 2018-07-21 LAB — LACTATE DEHYDROGENASE: LDH: 189 U/L (ref 98–192)

## 2018-07-21 MED ORDER — SODIUM CHLORIDE 0.9 % IV SOLN
Freq: Once | INTRAVENOUS | Status: AC
  Administered 2018-07-21: 12:00:00 via INTRAVENOUS
  Filled 2018-07-21: qty 4

## 2018-07-21 MED ORDER — SODIUM CHLORIDE 0.9 % IV SOLN
1920.0000 mg/m2 | INTRAVENOUS | Status: DC
  Administered 2018-07-21: 3100 mg via INTRAVENOUS
  Filled 2018-07-21: qty 62

## 2018-07-21 MED ORDER — SODIUM CHLORIDE 0.9% FLUSH
10.0000 mL | INTRAVENOUS | Status: DC | PRN
  Administered 2018-07-21: 10 mL
  Filled 2018-07-21: qty 10

## 2018-07-21 MED ORDER — LEUCOVORIN CALCIUM INJECTION 350 MG
700.0000 mg | Freq: Once | INTRAVENOUS | Status: AC
  Administered 2018-07-21: 700 mg via INTRAVENOUS
  Filled 2018-07-21: qty 35

## 2018-07-21 MED ORDER — SODIUM CHLORIDE 0.9 % IV SOLN
Freq: Once | INTRAVENOUS | Status: AC
  Administered 2018-07-21: 12:00:00 via INTRAVENOUS

## 2018-07-21 MED ORDER — HYDROXYZINE HCL 25 MG PO TABS
25.0000 mg | ORAL_TABLET | Freq: Once | ORAL | Status: AC
  Administered 2018-07-21: 25 mg via ORAL
  Filled 2018-07-21: qty 1

## 2018-07-21 MED ORDER — SODIUM CHLORIDE 0.9 % IV SOLN
50.0000 mg/m2 | Freq: Once | INTRAVENOUS | Status: AC
  Administered 2018-07-21: 81.7 mg via INTRAVENOUS
  Filled 2018-07-21: qty 19

## 2018-07-21 NOTE — Patient Instructions (Signed)
[facility] Discharge Instructions for Patients Receiving Chemotherapy  Today you received the following chemotherapy agents   To help prevent nausea and vomiting after your treatment, we encourage you to take your nausea medication   If you develop nausea and vomiting that is not controlled by your nausea medication, call the clinic.   BELOW ARE SYMPTOMS THAT SHOULD BE REPORTED IMMEDIATELY:  *FEVER GREATER THAN 100.5 F  *CHILLS WITH OR WITHOUT FEVER  NAUSEA AND VOMITING THAT IS NOT CONTROLLED WITH YOUR NAUSEA MEDICATION  *UNUSUAL SHORTNESS OF BREATH  *UNUSUAL BRUISING OR BLEEDING  TENDERNESS IN MOUTH AND THROAT WITH OR WITHOUT PRESENCE OF ULCERS  *URINARY PROBLEMS  *BOWEL PROBLEMS  UNUSUAL RASH Items with * indicate a potential emergency and should be followed up as soon as possible.  Feel free to call the clinic should you have any questions or concerns. The clinic phone number is [phone].  Please show the CHEMO ALERT CARD at check-in to the Emergency Department and triage nurse.   

## 2018-07-21 NOTE — Patient Instructions (Signed)
[place] at [facility] Discharge Instructions  You were seen today by Dr. Delton Coombes, he went over your recent labs. He will see you back in 2 weeks for labs, treatment and follow up.    Thank you for choosing [place] at [facility] to provide your oncology and hematology care.  To afford each patient quality time with our provider, please arrive at least 15 minutes before your scheduled appointment time.   If you have a lab appointment with the [facility] please come in thru the  Main Entrance and check in at the main information desk  You need to re-schedule your appointment should you arrive 10 or more minutes late.  We strive to give you quality time with our providers, and arriving late affects you and other patients whose appointments are after yours.  Also, if you no show three or more times for appointments you may be dismissed from the clinic at the providers discretion.     Again, thank you for choosing [facility].  Our hope is that these requests will decrease the amount of time that you wait before being seen by our physicians.       _____________________________________________________________  Should you have questions after your visit to [facility], please contact our office at [phone] between the hours of 8:00 a.m. and 4:30 p.m.  Voicemails left after 4:00 p.m. will not be returned until the following business day.  For prescription refill requests, have your pharmacy contact our office and allow 72 hours.    Cancer Center Support Programs:   > Cancer Support Group  2nd Tuesday of the month 1pm-2pm, Journey Room

## 2018-07-21 NOTE — Progress Notes (Signed)
[place], [place] [address]   CLINIC:  Medical Oncology/Hematology  PCP:  Fayrene Helper, MD [address] [place] [address] [phone]   REASON FOR VISIT:  Follow-up for  BRIEF ONCOLOGIC HISTORY:    Pancreatic cancer metastasized to liver [facility])   03/10/2014 Initial Diagnosis    Pancreatic cancer metastasized to liver    03/22/2014 - 03/05/2016 Chemotherapy    Abraxane/Gemzar days 1, 8, every 28 days.  Day 15 was cancelled due to leukopenia and thrombocytopenia on day 15 cycle 1.    05/24/2014 Treatment Plan Change    Day 8 of cycle 3 is held with ANC of 1.1    06/05/2014 Imaging    CT C/A/P . Interval decrease in size of the pancreatic tail mass.Improved hepatic metastatic disease. No new lesions. No CT findings for metastatic disease involving the chest.    08/09/2014 Tumor Marker    CA 19-9= 33 (WNL)    10/26/2014 Imaging    MRI- Continued interval decrease in size of the hepatic metastatic lesions and no new lesions are identified. Continued decrease in size of the pancreatic tail lesion.    01/03/2015 Tumor Marker    Results for [name], [name] (MRN [id number]) as of 01/18/2015 12:52  01/03/2015 10:00 CA 19-9: 14     01/17/2015 Imaging    MRI- Response to therapy of hepatic metastasis.  Similar size of a pancreatic tail lesion.    03/20/2015 Imaging    MRI-L spine- Severe disc space narrowing at L2-L3, with endplate reactive changes. Large disc extrusion into the ventral epidural space,central to the RIGHT with a cephalad migrated free fragment. Additional Large disc extrusion into the retroperitone...    03/22/2015 Imaging    CT pelvis- No evidence for metastatic disease within the pelvis.    07/24/2015 Imaging    MRI abd- Continued response to therapy, with no residual detectable liver metastases. No new sites of metastatic disease in the abdomen.     08/15/2015 Code Status     She confirms desire for DNR  status.    12/21/2015 Imaging    MRI liver- Severe image degradation due to motion artifact reducing diagnostic sensitivity and specificity. Reduce conspicuity of the pancreatic tail lesion suggesting further improvement. The original liver lesions have resolved.    03/05/2016 Treatment Plan Change    Chemotherapy holiday/Break after 2 years worth of treatment    04/07/2016 Imaging    CT chest- When compared to recent chest CT, new minimally displaced anterior left sixth rib fracture. Slight increase in subcarinal adenopathy    04/28/2016 Imaging    Bone density- BMD as determined from Femur Neck Right is 0.705 g/cm2 with a T-Score of -2.4. This patient is considered osteopenic according to [facility]) criteria. Compared with the prior study on 02/23/2013, the BMD of the lumbar spine/rt. femoral neck show a statistically significant decrease.     05/23/2016 Imaging    MRI abd- 1. Exam is significantly degraded by patient respiratory motion. Consider follow-up exams with CT abdomen with without contrast per pancreatic protocol. 2. Fullness in tail of pancreas is more prominent and potentially increased in size. Recommend close attention on follow-up. (Consider CT as above) 3. No explanation for back pain.    09/11/2016 Imaging    MRI pancreas- Interval progression of pancreatic tail lesion. New differential perfusion right liver with areas of heterogeneity. Underlying metastatic disease in this region not excluded.  09/11/2016 Progression    MRi in conjunction with rising CA 19-9 are indicative of relapse of disease.    01/09/2017 Progression    CT C/A/P: 2.1 x 2.9 cm lesion in the pancreatic tail and abutting the splenic hilum, corresponding to known primary pancreatic neoplasm, mildly increased.  Scattered small hypoenhancing lesions in the liver measuring up to 10 mm, approximately 8-10 in number, suspicious for hepatic metastases.  No findings  specific for metastatic disease in the chest.  Additional ancillary findings as above.     01/27/2017 - 10/20/2017 Chemotherapy    The patient had gemcitabine (GEMZAR) 1,292 mg in sodium chloride 0.9 % 250 mL chemo infusion, 800 mg/m2 = 1,292 mg, Intravenous,  Once, 9 of 12 cycles Administration: 1,292 mg (01/28/2017), 1,292 mg (02/11/2017), 1,292 mg (02/25/2017), 1,292 mg (03/11/2017), 1,292 mg (03/25/2017), 1,292 mg (04/08/2017), 1,292 mg (04/22/2017), 1,292 mg (04/29/2017), 1,292 mg (05/20/2017), 1,292 mg (06/03/2017), 1,292 mg (06/24/2017), 1,292 mg (07/08/2017), 1,292 mg (07/29/2017), 1,292 mg (08/12/2017), 1,292 mg (08/26/2017), 1,292 mg (09/09/2017), 1,292 mg (09/23/2017), 1,292 mg (10/07/2017)  for chemotherapy treatment.     10/21/2017 -  Chemotherapy    The patient had leucovorin 700 mg in dextrose 5 % 250 mL infusion, 644 mg, Intravenous,  Once, 18 of 19 cycles Administration: 700 mg (10/21/2017), 700 mg (11/04/2017), 700 mg (11/18/2017), 700 mg (12/02/2017), 700 mg (12/16/2017), 700 mg (12/30/2017), 700 mg (01/13/2018), 700 mg (01/27/2018), 700 mg (02/10/2018), 700 mg (02/24/2018), 700 mg (03/10/2018), 700 mg (03/24/2018), 700 mg (04/28/2018), 700 mg (05/17/2018), 700 mg (05/31/2018), 700 mg (06/16/2018), 700 mg (06/30/2018), 700 mg (07/21/2018) ondansetron (ZOFRAN) 8 mg in sodium chloride 0.9 % 50 mL IVPB, , Intravenous,  Once, 17 of 18 cycles Administration:  (11/04/2017),  (01/27/2018),  (02/10/2018),  (02/24/2018),  (03/10/2018),  (03/24/2018),  (04/28/2018),  (05/17/2018),  (05/31/2018),  (06/16/2018),  (06/30/2018),  (07/21/2018) fluorouracil (ADRUCIL) 3,850 mg in sodium chloride 0.9 % 73 mL chemo infusion, 2,400 mg/m2 = 3,850 mg, Intravenous, 1 Day/Dose, 18 of 19 cycles Dose modification: 1,920 mg/m2 (original dose 2,400 mg/m2, Cycle 2, Reason: Provider Judgment, Comment: mucisitis) Administration: 3,850 mg (10/21/2017), 3,100 mg (11/04/2017), 3,100 mg (11/18/2017), 3,100 mg (12/02/2017), 3,100 mg (12/16/2017), 3,100 mg  (12/30/2017), 3,100 mg (01/13/2018), 3,100 mg (01/27/2018), 3,100 mg (02/10/2018), 3,100 mg (02/24/2018), 3,100 mg (03/10/2018), 3,100 mg (03/24/2018), 3,100 mg (04/28/2018), 3,100 mg (05/17/2018), 3,100 mg (05/31/2018), 3,100 mg (06/16/2018), 3,100 mg (06/30/2018) irinotecan LIPOSOME (ONIVYDE) 81.7 mg in sodium chloride 0.9 % 500 mL chemo infusion, 50 mg/m2 = 81.7 mg (100 % of original dose 50 mg/m2), Intravenous, Once, 18 of 19 cycles Dose modification: 50 mg/m2 (original dose 50 mg/m2, Cycle 1, Reason: Provider Judgment), 50 mg/m2 (original dose 50 mg/m2, Cycle 2, Reason: Provider Judgment) Administration: 81.7 mg (10/21/2017), 81.7 mg (11/04/2017), 81.7 mg (11/18/2017), 81.7 mg (12/02/2017), 81.7 mg (12/16/2017), 81.7 mg (12/30/2017), 81.7 mg (01/13/2018), 81.7 mg (01/27/2018), 81.7 mg (02/10/2018), 81.7 mg (02/24/2018), 81.7 mg (03/10/2018), 81.7 mg (03/24/2018), 81.7 mg (04/28/2018), 81.7 mg (05/17/2018), 81.7 mg (05/31/2018), 81.7 mg (06/16/2018), 81.7 mg (06/30/2018), 81.7 mg (07/21/2018)  for chemotherapy treatment.       CANCER STAGING: Cancer Staging Pancreatic cancer metastasized to liver [facility]) Staging form: Pancreas, AJCC 7th Edition - Clinical: Stage IV (T3, N1, M1) - Signed by Baird Cancer, PA-C on 03/16/2014 - Pathologic: No stage assigned - Unsigned    INTERVAL HISTORY:  Ms. Kinn 85 y.o. female returns for routine follow-up and consideration for next cycle of chemotherapy. She is here today by herself.  He states that she was seen yesterday by her PCP and given prednisone for a rash, allergic reaction. She is experiening some constipation.  Denies any nausea, vomiting, or diarrhea. Denies any new pains. Had not noticed any recent bleeding such as epistaxis, hematuria or hematochezia. Denies recent chest pain on exertion, shortness of breath on minimal exertion, pre-syncopal episodes, or palpitations. Denies any numbness or tingling in hands or feet. Denies any recent fevers, infections, or recent  hospitalizations. Patient reports appetite at 50% and energy level at 50%.  Overall, she feels ready for next cycle of chemo today.     REVIEW OF SYSTEMS:  Review of Systems  Constitutional: Positive for fatigue.  Gastrointestinal: Positive for constipation.     PAST MEDICAL/SURGICAL HISTORY:  Past Medical History:  Diagnosis Date  . Anemia due to antineoplastic chemotherapy 09/12/2015   Started Aranesp 500 mcg on 09/12/2015  . Anxiety   . Chronic diarrhea   . Depression   . DNR (do not resuscitate) 08/16/2015  . Erosive esophagitis   . GERD (gastroesophageal reflux disease)   . Hx of adenomatous colonic polyps    tubular adenomas, last found in 2008  . Hyperplastic colon polyp 03/19/10   tcs by Dr. Gala Romney  . Hypertension 20 years   . Kidney stone    hx/ crushed   . Opioid contract exists 04/18/2015   With Dr. Moshe Cipro  . Pancreatic cancer ([place]) 02/2014  . Pancreatic cancer metastasized to liver ([place]) 03/10/2014  . Schatzki's ring 08/26/10   Last dilated on EGD by Dr. Trevor Iha [facility], linear gastric erosions, BI hemigastrectomy   Past Surgical History:  Procedure Laterality Date  . BREAST LUMPECTOMY Left   . bunion removal     from both feet   . CHOLECYSTECTOMY  1965   . COLONOSCOPY  03/19/2010   DR Gala Romney,, normal TI, pancolonic diverticula, random colon bx neg., hyperplastic polyps removed  . ESOPHAGOGASTRODUODENOSCOPY  11/22/2003   DR Gala Romney, erosive RE, Billroth I  . ESOPHAGOGASTRODUODENOSCOPY  08/26/10   Dr. Gala Romney- moderate severe ERE, Scahtzki ring s/p dilation, Billroth I, linear gastric erosions, bx-gastric xanthelasma  . LEFT SHOULDER SURGERY  2009   DR HARRISON  . ORIF ANKLE FRACTURE Right 05/22/2013   Procedure: OPEN REDUCTION INTERNAL FIXATION (ORIF) RIGHT ANKLE FRACTURE;  Surgeon: Sanjuana Kava, MD;  Location: [facility];  Service: Orthopedics;  Laterality: Right;  . stomach ulcer  50 years ago    had some of her stomach removed      SOCIAL HISTORY:  Social  History   Socioeconomic History  . Marital status: Widowed    Spouse name: Not on file  . Number of children: 2  . Years of education: 73  . Highest education level: 12th grade  Occupational History  . Occupation: retired from Estate manager/land agent: RETIRED  Social Needs  . Financial resource strain: Not hard at all  . Food insecurity:    Worry: Never true    Inability: Never true  . Transportation needs:    Medical: No    Non-medical: No  Tobacco Use  . Smoking status: Current Some Day Smoker    Packs/day: 0.50    Years: 60.00    Pack years: 30.00    Types: Cigarettes    Last attempt to quit: 09/09/2016    Years since quitting: 1.8  . Smokeless tobacco: Never Used  Substance and Sexual Activity  . Alcohol use: No  . Drug use: No  . Sexual activity: Not  Currently  Lifestyle  . Physical activity:    Days per week: 3 days    Minutes per session: 30 min  . Stress: Not at all  Relationships  . Social connections:    Talks on phone: More than three times a week    Gets together: Three times a week    Attends religious service: 1 to 4 times per year    Active member of club or organization: No    Attends meetings of clubs or organizations: Never    Relationship status: Widowed  . Intimate partner violence:    Fear of current or ex partner: No    Emotionally abused: No    Physically abused: No    Forced sexual activity: No  Other Topics Concern  . Not on file  Social History Narrative   Lives with son alone     FAMILY HISTORY:  Family History  Problem Relation Age of Onset  . Hypertension Mother   . Kidney failure Brother        on dialysis  . Diabetes Sister   . Diabetes Sister   . Hypertension Father   . Kidney failure Sister   . Kidney Stones Brother   . Breast cancer Son   . Gout Son   . Liver disease Neg Hx   . Colon cancer Neg Hx     CURRENT MEDICATIONS:  Outpatient Encounter Medications as of 07/21/2018  Medication Sig Note  . acetaminophen  (TYLENOL) 500 MG tablet Take 500 mg by mouth every 6 (six) hours as needed for mild pain or moderate pain.    Marland Kitchen amLODipine (NORVASC) 10 MG tablet Take 1 tablet (10 mg total) by mouth daily.   . clonazePAM (KLONOPIN) 0.5 MG tablet Take one tablet daily for anxiety   . cycloSPORINE (RESTASIS) 0.05 % ophthalmic emulsion Place 1 drop into both eyes 2 (two) times daily.   . diphenhydrAMINE (BENADRYL) 50 MG tablet Take 1 tablet 1 hour prior to CT 07/21/2018: Currently taking around the clock due to itching  . fluorometholone (FML) 0.1 % ophthalmic suspension INSTILL 1 DROP INTO EACH EYE THREE TIMES DAILY FOR 14 DAYS   . lidocaine-prilocaine (EMLA) cream Apply 1 application topically as needed. Apply to portacath site as needed   . metoprolol tartrate (LOPRESSOR) 25 MG tablet Take one tablet once daily for blood pressure   . mirabegron ER (MYRBETRIQ) 50 MG TB24 tablet Take 1 tablet (50 mg total) by mouth daily.   . pantoprazole (PROTONIX) 20 MG tablet Take 1 tablet by mouth daily   . predniSONE (STERAPRED UNI-PAK 21 TAB) 5 MG (21) TBPK tablet Use as directed on package 07/21/2018: Taking for itching on her neck and head  . traMADol (ULTRAM) 50 MG tablet Take one tablet once daily, as needed, for uncontrolled pain   . [DISCONTINUED] prochlorperazine (COMPAZINE) 10 MG tablet Take 1 tablet (10 mg total) by mouth every 6 (six) hours as needed (Nausea or vomiting).    Facility-Administered Encounter Medications as of 07/21/2018  Medication  . sodium chloride flush (NS) 0.9 % injection 10 mL    ALLERGIES:  Allergies  Allergen Reactions  . Iohexol Hives and Shortness Of Breath    **Patient can not have IV contrast, pt has breakthrough reaction even after 13 hour premeds** Do not give IV contrast PATIENT HAD TO BE TAKEN TO ED, C/O WHELPS, HIVES, DIFFICULTY BREATHING. PATIENT GIVEN IV CONTRAST AFTER 13 HOUR PRE MEDS ON 01/09/2017 WITH NO REACTION NOTED. On 10/19/17 patient  had reaction despite premedication.     Marland Kitchen Aciphex [Rabeprazole Sodium] Other (See Comments)    unknown  . Amlodipine Besylate-Valsartan     Rash to high-dose (5/320)  . Esomeprazole Magnesium   . Omeprazole Other (See Comments)    Patient states "medication didn't work"  . Ciprofloxacin Rash  . Penicillins Swelling and Rash    Has patient had a PCN reaction causing immediate rash, facial/tongue/throat swelling, SOB or lightheadedness with hypotension: Yes Has patient had a PCN reaction causing severe rash involving mucus membranes or skin necrosis: Yes Has patient had a PCN reaction that required hospitalization: No Has patient had a PCN reaction occurring within the last 10 years: yes If all of the above answers are "NO", then may proceed with Cephalosporin use.   [name] [Benzonatate] Itching    Full head, face, neck, and chest itching.      PHYSICAL EXAM:  ECOG Performance status: 1  Vitals:   07/21/18 1002  BP: (!) 155/62  Pulse: (!) 55  Resp: 20  Temp: 98.2 F (36.8 C)  SpO2: 100%   Filed Weights   07/21/18 1002  Weight: 122 lb (55.3 kg)    Physical Exam Constitutional:      Appearance: Normal appearance.  Cardiovascular:     Rate and Rhythm: Normal rate and regular rhythm.  Pulmonary:     Effort: Pulmonary effort is normal.     Breath sounds: Normal breath sounds.  Abdominal:     General: There is no distension.     Palpations: Abdomen is soft. There is no mass.  Musculoskeletal:        General: No swelling.  Skin:    General: Skin is warm.     Findings: No rash.  Neurological:     Mental Status: She is alert and oriented to person, place, and time.  Psychiatric:        Mood and Affect: Mood normal.        Behavior: Behavior normal.      LABORATORY DATA:  I have reviewed the labs as listed.  CBC    Component Value Date/Time   WBC 3.9 (L) 07/21/2018 0952   RBC 3.41 (L) 07/21/2018 0952   HGB 10.2 (L) 07/21/2018 0952   HCT 31.2 (L) 07/21/2018 0952   PLT 191 07/21/2018  0952   MCV 91.5 07/21/2018 0952   MCH 29.9 07/21/2018 0952   MCHC 32.7 07/21/2018 0952   RDW 16.7 (H) 07/21/2018 0952   LYMPHSABS 1.1 07/21/2018 0952   MONOABS 0.7 07/21/2018 0952   EOSABS 0.2 07/21/2018 0952   BASOSABS 0.0 07/21/2018 0952   CMP Latest Ref Rng & Units 07/21/2018 07/14/2018 06/30/2018  Glucose 70 - 99 mg/dL 102(H) 106(H) 106(H)  BUN 8 - 23 mg/dL 25(H) 22 20  Creatinine 0.44 - 1.00 mg/dL 1.69(H) 1.58(H) 1.56(H)  Sodium 135 - 145 mmol/L 141 139 138  Potassium 3.5 - 5.1 mmol/L 4.1 3.5 4.0  Chloride 98 - 111 mmol/L 106 107 104  CO2 22 - 32 mmol/L _0 Calcium 8.9 - 10.3 mg/dL 8.7(L) 8.2(L) 8.5(L)  Total Protein 6.5 - 8.1 g/dL 6.8 6.6 6.8  Total Bilirubin 0.3 - 1.2 mg/dL 0.4 0.7 0.7  Alkaline Phos 38 - 126 U/L 141(H) 153(H) 136(H)  AST 15 - 41 U/L 16 14(L) 17  ALT 0 - 44 U/L _1 DIAGNOSTIC IMAGING:  I have independently reviewed the scans and discussed with the  patient.   I have reviewed Francene Finders, NP's note and agree with the documentation.  I personally performed a face-to-face visit, made revisions and my assessment and plan is as follows.    ASSESSMENT & PLAN:   Pancreatic cancer metastasized to liver ([place]) 1.  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 28 days started on 01/28/2017 -CT scan done on 07/13/2017 shows stable liver disease. CA 19-9 was on 06/24/2017 at 129.  CA-19-9 at last visit went up to 171. - I discussed the latest CT scan reports which showed progression in the liver lesions which are very subtle.  There is progression in the tail of the pancreas lesion measuring 27 x 27 mm.  Even though the progression is very small, her CA 19-9 is continuing to go up, last one at 171.  Hence I have  recommended a change in her treatment.  Gemcitabine and Tarceva were discontinued. - First cycle of infusional 5-FU and liposomal Irinotecan started on 10/21/2017, Onivyde started at a decreased dose of 50 mg/m.  The dose of infusional 5-FU was reduced by 20% after cycle 1 secondary to mucositis. - Cycle 6 chemotherapy was on 12/30/2017.  She is tolerating it very well.  We did reviewed the results of CEA which has come down to 206 on 12/30/2017.  This was 316 in June. - CT CAP on 01/04/2018 without contrast shows stable mass in the pancreatic tail and less distinctively seen hepatic metastasis.  - She has completed cycle 12 on 03/24/2018.  She is tolerating it very well except some minor side effects including hyperpigmentation of the skin. - She is continuing to get response with her tumor marker trending down from 316 prior to start of therapy to 110 on 03/10/2018. -CT CAP on 04/16/2018 done without contrast showed suggestion of a new lesion in the segment 4 of the liver measuring 1.3 cm.  This was vaguely present on prior scan.  Pancreatic tail lesion is stable.  She cannot get contrast due to allergy. - She completed 16 cycles on 06/16/2018. - We reviewed the results of the CT CAP from 06/28/2018.  Stable appearance of focal thickening in the tail of the pancreas extending into the splenic hilum.  Liver metastasis are similar with no progression.  No new lesions. -We will continue chemotherapy as long as she tolerates it. -CA 19- 9 07/08/2017 was 126.  Her cough has improved with Tessalon Perles. - Cycle 17 was on 06/30/2018. - Poorly had an allergic reaction after eating a tangelo.  She was evaluated by primary care doctor who was given tapering prednisone.  Right now she does not have any rash. -I have evaluated her blood work.  It is adequate to proceed with next cycle.  We will see her back in 2 weeks for follow-up.  2.  Weight loss: -She is drinking boost/Ensure every day.  Her weight is stable.   3.  Genetic testing: -She will be referred for BRCA 1/2 testing.  We will also consider MSI testing.      Orders placed this encounter:  No orders of the defined types were placed in this encounter.     Derek Jack, MD [facility] [phone]

## 2018-07-21 NOTE — Progress Notes (Signed)
Labs reviewed with Dr. Delton Coombes today . Creatinine of 1.69 noted by Nurse and MD. Proceed with treatment today per MD.   Patient complaining of itching in her head and neck line area. Her PCP has her on steroids and not she is requesting something in addition to that. MD notified and will give atarax as ordered.   Treatment given per orders. Patient tolerated it well without problems. Vitals stable and discharged home from clinic ambulatory. Follow up as scheduled.

## 2018-07-21 NOTE — Assessment & Plan Note (Signed)
1.  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 28 days started on 01/28/2017 -CT scan done on 07/13/2017 shows stable liver disease. CA 19-9 was on 06/24/2017 at 129.  CA-19-9 at last visit went up to 171. - I discussed the latest CT scan reports which showed progression in the liver lesions which are very subtle.  There is progression in the tail of the pancreas lesion measuring 27 x 27 mm.  Even though the progression is very small, her CA 19-9 is continuing to go up, last one at 171.  Hence I have recommended a change in her treatment.  Gemcitabine and Tarceva were discontinued. - First cycle of infusional 5-FU and liposomal Irinotecan started on 10/21/2017, Onivyde started at a decreased dose of 50 mg/m.  The dose of infusional 5-FU was reduced by 20% after cycle 1 secondary to mucositis. - Cycle 6 chemotherapy was on 12/30/2017.  She is tolerating it very well.  We did reviewed the results of CEA which has come down to 206 on 12/30/2017.  This was 316 in June. - CT CAP on 01/04/2018 without contrast shows stable mass in the pancreatic tail and less distinctively seen hepatic metastasis.  - She has completed cycle 12 on 03/24/2018.  She is tolerating it very well except some minor side effects including hyperpigmentation of the skin. - She is continuing to get response with her tumor marker trending down from 316 prior to start of therapy to 110 on 03/10/2018. -CT CAP on 04/16/2018 done without contrast showed suggestion of a new lesion in the segment 4 of the liver measuring 1.3 cm.  This was vaguely present on prior scan.  Pancreatic tail lesion is stable.  She cannot get contrast due to allergy. - She completed 16 cycles on 06/16/2018. - We reviewed the results of the CT  CAP from 06/28/2018.  Stable appearance of focal thickening in the tail of the pancreas extending into the splenic hilum.  Liver metastasis are similar with no progression.  No new lesions. -We will continue chemotherapy as long as she tolerates it. -CA 19- 9 07/08/2017 was 126.  Her cough has improved with Tessalon Perles. - Cycle 17 was on 06/30/2018. - Poorly had an allergic reaction after eating a tangelo.  She was evaluated by primary care doctor who was given tapering prednisone.  Right now she does not have any rash. -I have evaluated her blood work.  It is adequate to proceed with next cycle.  We will see her back in 2 weeks for follow-up.  2.  Weight loss: -She is drinking boost/Ensure every day.  Her weight is stable.  3.  Genetic testing: -She will be referred for BRCA 1/2 testing.  We will also consider MSI testing.

## 2018-07-22 DIAGNOSIS — C259 Malignant neoplasm of pancreas, unspecified: Secondary | ICD-10-CM | POA: Diagnosis not present

## 2018-07-22 DIAGNOSIS — C787 Secondary malignant neoplasm of liver and intrahepatic bile duct: Secondary | ICD-10-CM | POA: Diagnosis not present

## 2018-07-22 LAB — CANCER ANTIGEN 19-9: CAN 19-9: 104 U/mL — AB (ref 0–35)

## 2018-07-23 ENCOUNTER — Other Ambulatory Visit: Payer: Self-pay

## 2018-07-23 ENCOUNTER — Encounter (HOSPITAL_COMMUNITY): Payer: Self-pay

## 2018-07-23 ENCOUNTER — Inpatient Hospital Stay (HOSPITAL_COMMUNITY): Payer: Medicare HMO

## 2018-07-23 VITALS — BP 136/82 | HR 56 | Temp 97.7°F | Resp 18

## 2018-07-23 DIAGNOSIS — C787 Secondary malignant neoplasm of liver and intrahepatic bile duct: Principal | ICD-10-CM

## 2018-07-23 DIAGNOSIS — C259 Malignant neoplasm of pancreas, unspecified: Secondary | ICD-10-CM

## 2018-07-23 DIAGNOSIS — Z5111 Encounter for antineoplastic chemotherapy: Secondary | ICD-10-CM | POA: Diagnosis not present

## 2018-07-23 MED ORDER — SODIUM CHLORIDE 0.9% FLUSH
10.0000 mL | INTRAVENOUS | Status: DC | PRN
  Administered 2018-07-23: 10 mL
  Filled 2018-07-23: qty 10

## 2018-07-23 MED ORDER — HEPARIN SOD (PORK) LOCK FLUSH 100 UNIT/ML IV SOLN
500.0000 [IU] | Freq: Once | INTRAVENOUS | Status: AC | PRN
  Administered 2018-07-23: 500 [IU]

## 2018-07-23 NOTE — Progress Notes (Signed)
[name] tolerated 5FU pump well without complaints or incident. 5FU pump discontinued with port flushed with 10 ml NS and 5 ml Heparin easily per protocol then de-accessed. VSS Pt discharged self ambulatory using her cane in satisfactory condition

## 2018-07-23 NOTE — Patient Instructions (Signed)
[facility] at [facility] Discharge Instructions  5FU pump discontinued today with portacath flushed per protocol. Follow-up as scheduled. Call clinic for any questions or concerns   Thank you for choosing [facility] at [facility] to provide your oncology and hematology care.  To afford each patient quality time with our provider, please arrive at least 15 minutes before your scheduled appointment time.   If you have a lab appointment with the Cancer Center please come in thru the  Main Entrance and check in at the main information desk  You need to re-schedule your appointment should you arrive 10 or more minutes late.  We strive to give you quality time with our providers, and arriving late affects you and other patients whose appointments are after yours.  Also, if you no show three or more times for appointments you may be dismissed from the clinic at the providers discretion.     Again, thank you for choosing [facility].  Our hope is that these requests will decrease the amount of time that you wait before being seen by our physicians.       _____________________________________________________________  Should you have questions after your visit to  Cancer Center, please contact our office at [phone] between the hours of 8:00 a.m. and 4:30 p.m.  Voicemails left after 4:00 p.m. will not be returned until the following business day.  For prescription refill requests, have your pharmacy contact our office and allow 72 hours.    Cancer Center Support Programs:   > Cancer Support Group  2nd Tuesday of the month 1pm-2pm, Journey Room   

## 2018-07-27 DIAGNOSIS — L218 Other seborrheic dermatitis: Secondary | ICD-10-CM | POA: Diagnosis not present

## 2018-07-28 ENCOUNTER — Other Ambulatory Visit (HOSPITAL_COMMUNITY): Payer: Medicare HMO

## 2018-07-28 ENCOUNTER — Ambulatory Visit (HOSPITAL_COMMUNITY): Payer: Medicare HMO

## 2018-07-28 ENCOUNTER — Ambulatory Visit (HOSPITAL_COMMUNITY): Payer: Medicare HMO | Admitting: Hematology

## 2018-07-30 ENCOUNTER — Encounter (HOSPITAL_COMMUNITY): Payer: Medicare HMO

## 2018-08-03 ENCOUNTER — Telehealth: Payer: Self-pay

## 2018-08-03 NOTE — Telephone Encounter (Signed)
Clonzepam refilled #[id number] no refills faxed to mail in pharmacy. Tramadol too early to refill

## 2018-08-03 NOTE — Telephone Encounter (Signed)
error 

## 2018-08-04 ENCOUNTER — Other Ambulatory Visit: Payer: Self-pay

## 2018-08-04 ENCOUNTER — Inpatient Hospital Stay (HOSPITAL_COMMUNITY): Payer: Medicare HMO

## 2018-08-04 ENCOUNTER — Encounter (HOSPITAL_COMMUNITY): Payer: Self-pay | Admitting: Hematology

## 2018-08-04 ENCOUNTER — Inpatient Hospital Stay (HOSPITAL_BASED_OUTPATIENT_CLINIC_OR_DEPARTMENT_OTHER): Payer: Medicare HMO | Admitting: Hematology

## 2018-08-04 ENCOUNTER — Encounter (HOSPITAL_COMMUNITY): Payer: Self-pay

## 2018-08-04 VITALS — BP 150/54 | HR 55 | Temp 97.4°F | Resp 18 | Wt 119.6 lb

## 2018-08-04 DIAGNOSIS — Z5111 Encounter for antineoplastic chemotherapy: Secondary | ICD-10-CM | POA: Diagnosis not present

## 2018-08-04 DIAGNOSIS — C252 Malignant neoplasm of tail of pancreas: Secondary | ICD-10-CM

## 2018-08-04 DIAGNOSIS — C259 Malignant neoplasm of pancreas, unspecified: Secondary | ICD-10-CM

## 2018-08-04 DIAGNOSIS — R634 Abnormal weight loss: Secondary | ICD-10-CM | POA: Diagnosis not present

## 2018-08-04 DIAGNOSIS — L219 Seborrheic dermatitis, unspecified: Secondary | ICD-10-CM

## 2018-08-04 DIAGNOSIS — C787 Secondary malignant neoplasm of liver and intrahepatic bile duct: Principal | ICD-10-CM

## 2018-08-04 DIAGNOSIS — R69 Illness, unspecified: Secondary | ICD-10-CM | POA: Diagnosis not present

## 2018-08-04 DIAGNOSIS — F17210 Nicotine dependence, cigarettes, uncomplicated: Secondary | ICD-10-CM

## 2018-08-04 LAB — COMPREHENSIVE METABOLIC PANEL
ALT: 13 U/L (ref 0–44)
AST: 18 U/L (ref 15–41)
Albumin: 3.6 g/dL (ref 3.5–5.0)
Alkaline Phosphatase: 147 U/L — ABNORMAL HIGH (ref 38–126)
Anion gap: 6 (ref 5–15)
BUN: 27 mg/dL — ABNORMAL HIGH (ref 8–23)
CO2: 25 mmol/L (ref 22–32)
Calcium: 8.5 mg/dL — ABNORMAL LOW (ref 8.9–10.3)
Chloride: 109 mmol/L (ref 98–111)
Creatinine, Ser: 1.34 mg/dL — ABNORMAL HIGH (ref 0.44–1.00)
GFR calc Af Amer: 41 mL/min — ABNORMAL LOW (ref >60)
GFR calc non Af Amer: 36 mL/min — ABNORMAL LOW (ref >60)
Glucose, Bld: 133 mg/dL — ABNORMAL HIGH (ref 70–99)
Potassium: 3.9 mmol/L (ref 3.5–5.1)
Sodium: 140 mmol/L (ref 135–145)
Total Bilirubin: 0.7 mg/dL (ref 0.3–1.2)
Total Protein: 7 g/dL (ref 6.5–8.1)

## 2018-08-04 LAB — CBC WITH DIFFERENTIAL/PLATELET
Abs Immature Granulocytes: 0 10*3/uL (ref 0.00–0.07)
Basophils Absolute: 0 10*3/uL (ref 0.0–0.1)
Basophils Relative: 0 %
Eosinophils Absolute: 0.4 10*3/uL (ref 0.0–0.5)
Eosinophils Relative: 7 %
HCT: 32.7 % — ABNORMAL LOW (ref 36.0–46.0)
HEMOGLOBIN: 10.7 g/dL — AB (ref 12.0–15.0)
Immature Granulocytes: 0 %
LYMPHS PCT: 16 %
Lymphs Abs: 0.9 10*3/uL (ref 0.7–4.0)
MCH: 29.6 pg (ref 26.0–34.0)
MCHC: 32.7 g/dL (ref 30.0–36.0)
MCV: 90.6 fL (ref 80.0–100.0)
MONO ABS: 0.4 10*3/uL (ref 0.1–1.0)
Monocytes Relative: 8 %
NEUTROS ABS: 3.7 10*3/uL (ref 1.7–7.7)
Neutrophils Relative %: 69 %
Platelets: 156 10*3/uL (ref 150–400)
RBC: 3.61 MIL/uL — AB (ref 3.87–5.11)
RDW: 17.2 % — ABNORMAL HIGH (ref 11.5–15.5)
WBC: 5.4 10*3/uL (ref 4.0–10.5)
nRBC: 0 % (ref 0.0–0.2)

## 2018-08-04 LAB — LACTATE DEHYDROGENASE: LDH: 150 U/L (ref 98–192)

## 2018-08-04 MED ORDER — LEUCOVORIN CALCIUM INJECTION 350 MG
700.0000 mg | Freq: Once | INTRAVENOUS | Status: AC
  Administered 2018-08-04: 700 mg via INTRAVENOUS
  Filled 2018-08-04: qty 35

## 2018-08-04 MED ORDER — SODIUM CHLORIDE 0.9 % IV SOLN
Freq: Once | INTRAVENOUS | Status: AC
  Administered 2018-08-04: 11:00:00 via INTRAVENOUS
  Filled 2018-08-04: qty 4

## 2018-08-04 MED ORDER — SODIUM CHLORIDE 0.9% FLUSH
10.0000 mL | INTRAVENOUS | Status: DC | PRN
  Administered 2018-08-04: 10 mL
  Filled 2018-08-04: qty 10

## 2018-08-04 MED ORDER — SODIUM CHLORIDE 0.9 % IV SOLN
1920.0000 mg/m2 | INTRAVENOUS | Status: DC
  Administered 2018-08-04: 3100 mg via INTRAVENOUS
  Filled 2018-08-04: qty 62

## 2018-08-04 MED ORDER — SODIUM CHLORIDE 0.9 % IV SOLN
Freq: Once | INTRAVENOUS | Status: AC
  Administered 2018-08-04: 10:00:00 via INTRAVENOUS

## 2018-08-04 MED ORDER — SODIUM CHLORIDE 0.9 % IV SOLN
50.0000 mg/m2 | Freq: Once | INTRAVENOUS | Status: AC
  Administered 2018-08-04: 81.7 mg via INTRAVENOUS
  Filled 2018-08-04: qty 19

## 2018-08-04 NOTE — Assessment & Plan Note (Signed)
1.  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 28 days from 01/28/2017 until 10/07/2017 with progression. - CT CAP on 10/19/2017 showed progression of liver lesions which are very subtle.  Progression in the tail of the pancreas lesion measuring 27 x 27 mm.  Her tumor markers were also going up.  Hence a treatment change was recommended.  - Cycle 1 of infusional 5-FU and Onivyde (50 mg/m) started on 10/21/2017, dose of infusional 5-FU decreased by 20% after cycle 1 secondary to mucositis.  - CT CAP on 06/28/2018 shows stable appearance of focal thickening in the tail of the pancreas extending into the splenic hilum.  Liver metastasis are similar with no progression.  No new liver lesions. - She was found to have rash over the scalp and neck region.  She was evaluated by allergist and a diagnosis of seborrheic dermatitis was made.  She is on fluticasone topical cream which is helping her rash. - I have reviewed her blood work.  CA 19-9 has come down to 104 on 07/21/2018. - She is tolerating chemotherapy very well.  She may proceed with her next cycle today. - She will come back in 2 weeks for follow-up.  We will consider scans 3 months after the last.  2.  Weight loss: -She lost 3 pounds in the last 2 weeks.  She is drinking about 2 cans of Ensure per day. -I have recommended her to increase Ensure to 3 cans/day.  3.  Genetic testing: -We will test her for BRCA1/2 and MSI.

## 2018-08-04 NOTE — Progress Notes (Signed)
[name], [place] [id number]   CLINIC:  Medical Oncology/Hematology  PCP:  Fayrene Helper, MD [address] [place] [address] [phone]   REASON FOR VISIT:  Follow-up for pancreatic cancer to the liver  CURRENT THERAPY:Infusional 5FU and liposomal Irinotecan    BRIEF ONCOLOGIC HISTORY:    Pancreatic cancer metastasized to liver ([place])   03/10/2014 Initial Diagnosis    Pancreatic cancer metastasized to liver    03/22/2014 - 03/05/2016 Chemotherapy    Abraxane/Gemzar days 1, 8, every 28 days.  Day 15 was cancelled due to leukopenia and thrombocytopenia on day 15 cycle 1.    05/24/2014 Treatment Plan Change    Day 8 of cycle 3 is held with ANC of 1.1    06/05/2014 Imaging    CT C/A/P . Interval decrease in size of the pancreatic tail mass.Improved hepatic metastatic disease. No new lesions. No CT findings for metastatic disease involving the chest.    08/09/2014 Tumor Marker    CA 19-9= 33 (WNL)    10/26/2014 Imaging    MRI- Continued interval decrease in size of the hepatic metastatic lesions and no new lesions are identified. Continued decrease in size of the pancreatic tail lesion.    01/03/2015 Tumor Marker    Results for [name], [name] (MRN [id number]) as of 01/18/2015 12:52  01/03/2015 10:00 CA 19-9: 14     01/17/2015 Imaging    MRI- Response to therapy of hepatic metastasis.  Similar size of a pancreatic tail lesion.    03/20/2015 Imaging    MRI-L spine- Severe disc space narrowing at L2-L3, with endplate reactive changes. Large disc extrusion into the ventral epidural space,central to the RIGHT with a cephalad migrated free fragment. Additional Large disc extrusion into the retroperitone...    03/22/2015 Imaging    CT pelvis- No evidence for metastatic disease within the pelvis.    07/24/2015 Imaging    MRI abd- Continued response to therapy, with no residual detectable liver metastases. No new sites of  metastatic disease in the abdomen.     08/15/2015 Code Status     She confirms desire for DNR status.    12/21/2015 Imaging    MRI liver- Severe image degradation due to motion artifact reducing diagnostic sensitivity and specificity. Reduce conspicuity of the pancreatic tail lesion suggesting further improvement. The original liver lesions have resolved.    03/05/2016 Treatment Plan Change    Chemotherapy holiday/Break after 2 years worth of treatment    04/07/2016 Imaging    CT chest- When compared to recent chest CT, new minimally displaced anterior left sixth rib fracture. Slight increase in subcarinal adenopathy    04/28/2016 Imaging    Bone density- BMD as determined from Femur Neck Right is 0.705 g/cm2 with a T-Score of -2.4. This patient is considered osteopenic according to [facility]) criteria. Compared with the prior study on 02/23/2013, the BMD of the lumbar spine/rt. femoral neck show a statistically significant decrease.     05/23/2016 Imaging    MRI abd- 1. Exam is significantly degraded by patient respiratory motion. Consider follow-up exams with CT abdomen with without contrast per pancreatic protocol. 2. Fullness in tail of pancreas is more prominent and potentially increased in size. Recommend close attention on follow-up. (Consider CT as above) 3. No explanation for back pain.    09/11/2016 Imaging    MRI pancreas- Interval progression of pancreatic tail lesion. New differential perfusion right liver  with areas of heterogeneity. Underlying metastatic disease in this region not excluded.    09/11/2016 Progression    MRi in conjunction with rising CA 19-9 are indicative of relapse of disease.    01/09/2017 Progression    CT C/A/P: 2.1 x 2.9 cm lesion in the pancreatic tail and abutting the splenic hilum, corresponding to known primary pancreatic neoplasm, mildly increased.  Scattered small hypoenhancing lesions in the liver measuring up  to 10 mm, approximately 8-10 in number, suspicious for hepatic metastases.  No findings specific for metastatic disease in the chest.  Additional ancillary findings as above.     01/27/2017 - 10/20/2017 Chemotherapy    The patient had gemcitabine (GEMZAR) 1,292 mg in sodium chloride 0.9 % 250 mL chemo infusion, 800 mg/m2 = 1,292 mg, Intravenous,  Once, 9 of 12 cycles Administration: 1,292 mg (01/28/2017), 1,292 mg (02/11/2017), 1,292 mg (02/25/2017), 1,292 mg (03/11/2017), 1,292 mg (03/25/2017), 1,292 mg (04/08/2017), 1,292 mg (04/22/2017), 1,292 mg (04/29/2017), 1,292 mg (05/20/2017), 1,292 mg (06/03/2017), 1,292 mg (06/24/2017), 1,292 mg (07/08/2017), 1,292 mg (07/29/2017), 1,292 mg (08/12/2017), 1,292 mg (08/26/2017), 1,292 mg (09/09/2017), 1,292 mg (09/23/2017), 1,292 mg (10/07/2017)  for chemotherapy treatment.     10/21/2017 -  Chemotherapy    The patient had leucovorin 700 mg in dextrose 5 % 250 mL infusion, 644 mg, Intravenous,  Once, 19 of 22 cycles Administration: 700 mg (10/21/2017), 700 mg (11/04/2017), 700 mg (11/18/2017), 700 mg (12/02/2017), 700 mg (12/16/2017), 700 mg (12/30/2017), 700 mg (01/13/2018), 700 mg (01/27/2018), 700 mg (02/10/2018), 700 mg (02/24/2018), 700 mg (03/10/2018), 700 mg (03/24/2018), 700 mg (04/28/2018), 700 mg (05/17/2018), 700 mg (05/31/2018), 700 mg (06/16/2018), 700 mg (06/30/2018), 700 mg (07/21/2018) ondansetron (ZOFRAN) 8 mg in sodium chloride 0.9 % 50 mL IVPB, , Intravenous,  Once, 18 of 21 cycles Administration:  (11/04/2017),  (01/27/2018),  (02/10/2018),  (02/24/2018),  (03/10/2018),  (03/24/2018),  (04/28/2018),  (05/17/2018),  (05/31/2018),  (06/16/2018),  (06/30/2018),  (07/21/2018) fluorouracil (ADRUCIL) 3,850 mg in sodium chloride 0.9 % 73 mL chemo infusion, 2,400 mg/m2 = 3,850 mg, Intravenous, 1 Day/Dose, 19 of 22 cycles Dose modification: 1,920 mg/m2 (original dose 2,400 mg/m2, Cycle 2, Reason: Provider Judgment, Comment: mucisitis) Administration: 3,850 mg (10/21/2017), 3,100 mg  (11/04/2017), 3,100 mg (11/18/2017), 3,100 mg (12/02/2017), 3,100 mg (12/16/2017), 3,100 mg (12/30/2017), 3,100 mg (01/13/2018), 3,100 mg (01/27/2018), 3,100 mg (02/10/2018), 3,100 mg (02/24/2018), 3,100 mg (03/10/2018), 3,100 mg (03/24/2018), 3,100 mg (04/28/2018), 3,100 mg (05/17/2018), 3,100 mg (05/31/2018), 3,100 mg (06/16/2018), 3,100 mg (06/30/2018), 3,100 mg (07/21/2018) irinotecan LIPOSOME (ONIVYDE) 81.7 mg in sodium chloride 0.9 % 500 mL chemo infusion, 50 mg/m2 = 81.7 mg (100 % of original dose 50 mg/m2), Intravenous, Once, 19 of 22 cycles Dose modification: 50 mg/m2 (original dose 50 mg/m2, Cycle 1, Reason: Provider Judgment), 50 mg/m2 (original dose 50 mg/m2, Cycle 2, Reason: Provider Judgment) Administration: 81.7 mg (10/21/2017), 81.7 mg (11/04/2017), 81.7 mg (11/18/2017), 81.7 mg (12/02/2017), 81.7 mg (12/16/2017), 81.7 mg (12/30/2017), 81.7 mg (01/13/2018), 81.7 mg (01/27/2018), 81.7 mg (02/10/2018), 81.7 mg (02/24/2018), 81.7 mg (03/10/2018), 81.7 mg (03/24/2018), 81.7 mg (04/28/2018), 81.7 mg (05/17/2018), 81.7 mg (05/31/2018), 81.7 mg (06/16/2018), 81.7 mg (06/30/2018), 81.7 mg (07/21/2018)  for chemotherapy treatment.       CANCER STAGING: Cancer Staging Pancreatic cancer metastasized to liver [facility]) Staging form: Pancreas, AJCC 7th Edition - Clinical: Stage IV (T3, N1, M1) - Signed by Baird Cancer, PA-C on 03/16/2014 - Pathologic: No stage assigned - Unsigned    INTERVAL HISTORY:  Ms. Schaafsma 85 y.o.  female returns for routine follow-up and consideration for next cycle of chemotherapy. She is here today alone. She states that she is doing ok. She states that the rash has pretty much gone away but it comes back sometimes. She was given a cream to apply to affected areas.  She states that she has lost weight since her last visit, she was advised to drink ensure 3 times a day. Denies any nausea, vomiting, or diarrhea. Denies any new pains. Had not noticed any recent bleeding such as epistaxis, hematuria or  hematochezia. Denies recent chest pain on exertion, shortness of breath on minimal exertion, pre-syncopal episodes, or palpitations. Denies any numbness or tingling in hands or feet. Denies any recent fevers, infections, or recent hospitalizations. Patient reports appetite at 100% and energy level at 50%.    REVIEW OF SYSTEMS:  Review of Systems - Oncology   PAST MEDICAL/SURGICAL HISTORY:  Past Medical History:  Diagnosis Date   Anemia due to antineoplastic chemotherapy 09/12/2015   Started Aranesp 500 mcg on 09/12/2015   Anxiety    Chronic diarrhea    Depression    DNR (do not resuscitate) 08/16/2015   Erosive esophagitis    GERD (gastroesophageal reflux disease)    Hx of adenomatous colonic polyps    tubular adenomas, last found in 2008   Hyperplastic colon polyp 03/19/10   tcs by Dr. Gala Romney   Hypertension 20 years    Kidney stone    hx/ crushed    Opioid contract exists 04/18/2015   With Dr. Moshe Cipro   Pancreatic cancer [facility]) 02/2014   Pancreatic cancer metastasized to liver ([place]) 03/10/2014   Schatzki's ring 08/26/10   Last dilated on EGD by Dr. Trevor Iha [facility], linear gastric erosions, BI hemigastrectomy   Past Surgical History:  Procedure Laterality Date   BREAST LUMPECTOMY Left    bunion removal     from both [place]  03/19/2010   DR Gala Romney,, normal TI, pancolonic diverticula, random colon bx neg., hyperplastic polyps removed   ESOPHAGOGASTRODUODENOSCOPY  11/22/2003   DR Gala Romney, erosive RE, Billroth I   ESOPHAGOGASTRODUODENOSCOPY  08/26/10   Dr. Gala Romney- moderate severe ERE, Scahtzki ring s/p dilation, Billroth I, linear gastric erosions, bx-gastric xanthelasma   LEFT SHOULDER SURGERY  2009   DR HARRISON   ORIF ANKLE FRACTURE Right 05/22/2013   Procedure: OPEN REDUCTION INTERNAL FIXATION (ORIF) RIGHT ANKLE FRACTURE;  Surgeon: Sanjuana Kava, MD;  Location: [facility];  Service: Orthopedics;  Laterality: Right;   stomach  ulcer  50 years ago    had some of her stomach removed      SOCIAL HISTORY:  Social History   Socioeconomic History   Marital status: Widowed    Spouse name: Not on file   Number of children: 2   Years of education: 87   Highest education level: 12th grade  Occupational History   Occupation: retired from Estate manager/land agent: [place] resource strain: Not hard at all   Food insecurity:    Worry: Never true    Inability: Never true   Transportation needs:    Medical: No    Non-medical: No  Tobacco Use   Smoking status: Current Some Day Smoker    Packs/day: 0.50    Years: 60.00    Pack years: 30.00    Types: Cigarettes    Last attempt to quit: 09/09/2016    Years since quitting: 1.9  Smokeless tobacco: Never Used  Substance and Sexual Activity   Alcohol use: No   Drug use: No   Sexual activity: Not Currently  Lifestyle   Physical activity:    Days per week: 3 days    Minutes per session: 30 min   Stress: Not at all  Relationships   Social connections:    Talks on phone: More than three times a week    Gets together: Three times a week    Attends religious service: 1 to 4 times per year    Active member of club or organization: No    Attends meetings of clubs or organizations: Never    Relationship status: Widowed   Intimate partner violence:    Fear of current or ex partner: No    Emotionally abused: No    Physically abused: No    Forced sexual activity: No  Other Topics Concern   Not on file  Social History Narrative   Lives with son alone     FAMILY HISTORY:  Family History  Problem Relation Age of Onset   Hypertension Mother    Kidney failure Brother        on dialysis   Diabetes Sister    Diabetes Sister    Hypertension Father    Kidney failure Sister    Kidney Stones Brother    Breast cancer Son    Gout Son    Liver disease Neg Hx    Colon cancer Neg Hx     CURRENT MEDICATIONS:    Outpatient Encounter Medications as of 08/04/2018  Medication Sig Note   acetaminophen (TYLENOL) 500 MG tablet Take 500 mg by mouth every 6 (six) hours as needed for mild pain or moderate pain.     amLODipine (NORVASC) 10 MG tablet Take 1 tablet (10 mg total) by mouth daily.    clonazePAM (KLONOPIN) 0.5 MG tablet Take one tablet daily for anxiety    cycloSPORINE (RESTASIS) 0.05 % ophthalmic emulsion Place 1 drop into both eyes 2 (two) times daily.    diphenhydrAMINE (BENADRYL) 50 MG tablet Take 1 tablet 1 hour prior to CT 07/21/2018: Currently taking around the clock due to itching   fluorometholone (FML) 0.1 % ophthalmic suspension INSTILL 1 DROP INTO EACH EYE THREE TIMES DAILY FOR 14 DAYS    lidocaine-prilocaine (EMLA) cream Apply 1 application topically as needed. Apply to portacath site as needed    metoprolol tartrate (LOPRESSOR) 25 MG tablet Take one tablet once daily for blood pressure    mirabegron ER (MYRBETRIQ) 50 MG TB24 tablet Take 1 tablet (50 mg total) by mouth daily.    pantoprazole (PROTONIX) 20 MG tablet Take 1 tablet by mouth daily    traMADol (ULTRAM) 50 MG tablet Take one tablet once daily, as needed, for uncontrolled pain    predniSONE (STERAPRED UNI-PAK 21 TAB) 5 MG (21) TBPK tablet Use as directed on package (Patient not taking: Reported on 08/04/2018) 07/21/2018: Taking for itching on her neck and head   [DISCONTINUED] prochlorperazine (COMPAZINE) 10 MG tablet Take 1 tablet (10 mg total) by mouth every 6 (six) hours as needed (Nausea or vomiting).    Facility-Administered Encounter Medications as of 08/04/2018  Medication   sodium chloride flush (NS) 0.9 % injection 10 mL    ALLERGIES:  Allergies  Allergen Reactions   Iohexol Hives and Shortness Of Breath    **Patient can not have IV contrast, pt has breakthrough reaction even after 13 hour premeds** Do not give IV  contrast PATIENT HAD TO BE TAKEN TO ED, C/O WHELPS, HIVES, DIFFICULTY BREATHING. PATIENT  GIVEN IV CONTRAST AFTER 13 HOUR PRE MEDS ON 01/09/2017 WITH NO REACTION NOTED. On 10/19/17 patient had reaction despite premedication.     Aciphex [Rabeprazole Sodium] Other (See Comments)    unknown   Amlodipine Besylate-Valsartan     Rash to high-dose (5/320)   Esomeprazole Magnesium    Omeprazole Other (See Comments)    Patient states "medication didn't work"   Ciprofloxacin Rash   Penicillins Swelling and Rash    Has patient had a PCN reaction causing immediate rash, facial/tongue/throat swelling, SOB or lightheadedness with hypotension: Yes Has patient had a PCN reaction causing severe rash involving mucus membranes or skin necrosis: Yes Has patient had a PCN reaction that required hospitalization: No Has patient had a PCN reaction occurring within the last 10 years: yes If all of the above answers are "NO", then may proceed with Cephalosporin use.    Tessalon Perles [Benzonatate] Itching    Full head, face, neck, and chest itching.      PHYSICAL EXAM:  ECOG Performance status: 1  I reviewed her vitals.  Blood pressure is 147/57, pulse rate of 54, respiratory rate of 16, temperature 98 and saturations 100%.  Physical Exam Constitutional:      Appearance: Normal appearance.  Cardiovascular:     Rate and Rhythm: Normal rate and regular rhythm.     Heart sounds: Normal heart sounds.  Pulmonary:     Effort: Pulmonary effort is normal.     Breath sounds: Normal breath sounds.  Abdominal:     Palpations: Abdomen is soft. There is no mass.  Musculoskeletal:        General: No swelling.  Skin:    General: Skin is warm.  Neurological:     General: No focal deficit present.     Mental Status: She is alert and oriented to person, place, and time.  Psychiatric:        Mood and Affect: Mood normal.        Behavior: Behavior normal.      LABORATORY DATA:  I have reviewed the labs as listed.  CBC    Component Value Date/Time   WBC 5.4 08/04/2018 0846   RBC 3.61  (L) 08/04/2018 0846   HGB 10.7 (L) 08/04/2018 0846   HCT 32.7 (L) 08/04/2018 0846   PLT 156 08/04/2018 0846   MCV 90.6 08/04/2018 0846   MCH 29.6 08/04/2018 0846   MCHC 32.7 08/04/2018 0846   RDW 17.2 (H) 08/04/2018 0846   LYMPHSABS 0.9 08/04/2018 0846   MONOABS 0.4 08/04/2018 0846   EOSABS 0.4 08/04/2018 0846   BASOSABS 0.0 08/04/2018 0846   CMP Latest Ref Rng & Units 08/04/2018 07/21/2018 07/14/2018  Glucose 70 - 99 mg/dL 133(H) 102(H) 106(H)  BUN 8 - 23 mg/dL 27(H) 25(H) 22  Creatinine 0.44 - 1.00 mg/dL 1.34(H) 1.69(H) 1.58(H)  Sodium 135 - 145 mmol/L 140 141 139  Potassium 3.5 - 5.1 mmol/L 3.9 4.1 3.5  Chloride 98 - 111 mmol/L 109 106 107  CO2 22 - 32 mmol/L _0 Calcium 8.9 - 10.3 mg/dL 8.5(L) 8.7(L) 8.2(L)  Total Protein 6.5 - 8.1 g/dL 7.0 6.8 6.6  Total Bilirubin 0.3 - 1.2 mg/dL 0.7 0.4 0.7  Alkaline Phos 38 - 126 U/L 147(H) 141(H) 153(H)  AST 15 - 41 U/L 18 16 14(L)  ALT 0 - 44 U/L 13 11 12  DIAGNOSTIC IMAGING:  I have independently reviewed the scans and discussed with the patient.   I have reviewed Venita Lick LPN's note and agree with the documentation.  I personally performed a face-to-face visit, made revisions and my assessment and plan is as follows.    ASSESSMENT & PLAN:   Pancreatic cancer metastasized to liver ([place]) 1.  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 28 days from 01/28/2017 until 10/07/2017 with progression. - CT CAP on 10/19/2017 showed progression of liver lesions which are very subtle.  Progression in the tail of the pancreas lesion measuring 27 x 27 mm.  Her tumor markers were also going up.  Hence a treatment change was recommended.  - Cycle 1 of infusional 5-FU and Onivyde (50 mg/m) started  on 10/21/2017, dose of infusional 5-FU decreased by 20% after cycle 1 secondary to mucositis.  - CT CAP on 06/28/2018 shows stable appearance of focal thickening in the tail of the pancreas extending into the splenic hilum.  Liver metastasis are similar with no progression.  No new liver lesions. - She was found to have rash over the scalp and neck region.  She was evaluated by allergist and a diagnosis of seborrheic dermatitis was made.  She is on fluticasone topical cream which is helping her rash. - I have reviewed her blood work.  CA 19-9 has come down to 104 on 07/21/2018. - She is tolerating chemotherapy very well.  She may proceed with her next cycle today. - She will come back in 2 weeks for follow-up.  We will consider scans 3 months after the last.  2.  Weight loss: -She lost 3 pounds in the last 2 weeks.  She is drinking about 2 cans of Ensure per day. -I have recommended her to increase Ensure to 3 cans/day.  3.  Genetic testing: -We will test her for BRCA1/2 and MSI.       Orders placed this encounter:  No orders of the defined types were placed in this encounter.     Derek Jack, MD [place] [phone]

## 2018-08-04 NOTE — Patient Instructions (Signed)
[facility] Discharge Instructions for Patients Receiving Chemotherapy  Today you received the following chemotherapy agents  If you develop nausea and vomiting that is not controlled by your nausea medication, call the clinic.   BELOW ARE SYMPTOMS THAT SHOULD BE REPORTED IMMEDIATELY:  *FEVER GREATER THAN 100.5 F  *CHILLS WITH OR WITHOUT FEVER  NAUSEA AND VOMITING THAT IS NOT CONTROLLED WITH YOUR NAUSEA MEDICATION  *UNUSUAL SHORTNESS OF BREATH  *UNUSUAL BRUISING OR BLEEDING  TENDERNESS IN MOUTH AND THROAT WITH OR WITHOUT PRESENCE OF ULCERS  *URINARY PROBLEMS  *BOWEL PROBLEMS  UNUSUAL RASH Items with * indicate a potential emergency and should be followed up as soon as possible.  Feel free to call the clinic should you have any questions or concerns. The clinic phone number is [phone].  Please show the CHEMO ALERT CARD at check-in to the Emergency Department and triage nurse.   

## 2018-08-04 NOTE — Progress Notes (Signed)
Patient seen by Dr. Delton Coombes with lab review and ok to treat today.   Patient tolerated chemotherapy with no complaints voiced.  Port site clean and dry with no bruising or swelling noted at site.  Good blood return noted before and after administration of chemotherapy.  Chemotherapy pump connected with no alarms noted.  Dressing intact.   Patient left ambulatory with VSS and no s/s of distress noted.

## 2018-08-04 NOTE — Patient Instructions (Addendum)
[place] at [facility] Discharge Instructions  You were seen today by Dr. Delton Coombes. He went over your recent lab results were good. He would like you to start drinking Ensure 3 times during the day. He will see you back in 2 weeks for labs, treatment and follow up.   Thank you for choosing [place] at [facility] to provide your oncology and hematology care.  To afford each patient quality time with our provider, please arrive at least 15 minutes before your scheduled appointment time.   If you have a lab appointment with the [facility] please come in thru the  Main Entrance and check in at the main information desk  You need to re-schedule your appointment should you arrive 10 or more minutes late.  We strive to give you quality time with our providers, and arriving late affects you and other patients whose appointments are after yours.  Also, if you no show three or more times for appointments you may be dismissed from the clinic at the providers discretion.     Again, thank you for choosing [facility].  Our hope is that these requests will decrease the amount of time that you wait before being seen by our physicians.       _____________________________________________________________  Should you have questions after your visit to [facility], please contact our office at [phone] between the hours of 8:00 a.m. and 4:30 p.m.  Voicemails left after 4:00 p.m. will not be returned until the following business day.  For prescription refill requests, have your pharmacy contact our office and allow 72 hours.    Cancer Center Support Programs:   > Cancer Support Group  2nd Tuesday of the month 1pm-2pm, Journey Room

## 2018-08-06 ENCOUNTER — Inpatient Hospital Stay (HOSPITAL_COMMUNITY): Payer: Medicare HMO

## 2018-08-06 ENCOUNTER — Other Ambulatory Visit: Payer: Self-pay

## 2018-08-06 VITALS — BP 151/55 | HR 58 | Temp 97.1°F | Resp 18

## 2018-08-06 DIAGNOSIS — C259 Malignant neoplasm of pancreas, unspecified: Secondary | ICD-10-CM

## 2018-08-06 DIAGNOSIS — C787 Secondary malignant neoplasm of liver and intrahepatic bile duct: Secondary | ICD-10-CM | POA: Diagnosis not present

## 2018-08-06 DIAGNOSIS — Z5111 Encounter for antineoplastic chemotherapy: Secondary | ICD-10-CM | POA: Diagnosis not present

## 2018-08-06 MED ORDER — SODIUM CHLORIDE 0.9% FLUSH
10.0000 mL | INTRAVENOUS | Status: DC | PRN
  Administered 2018-08-06: 10 mL
  Filled 2018-08-06: qty 10

## 2018-08-06 MED ORDER — HEPARIN SOD (PORK) LOCK FLUSH 100 UNIT/ML IV SOLN
500.0000 [IU] | Freq: Once | INTRAVENOUS | Status: AC | PRN
  Administered 2018-08-06: 500 [IU]

## 2018-08-06 NOTE — Progress Notes (Signed)
Pt presents today for pump d/c. VSS. No complaints of any changes since the last visit.    No complaints at this time. Discharged from clinic ambulatory. F/U with [facility] as scheduled.

## 2018-08-06 NOTE — Patient Instructions (Signed)
[facility] Discharge Instructions for Patients Receiving Chemotherapy  Today you received the following chemotherapy agents   To help prevent nausea and vomiting after your treatment, we encourage you to take your nausea medication   If you develop nausea and vomiting that is not controlled by your nausea medication, call the clinic.   BELOW ARE SYMPTOMS THAT SHOULD BE REPORTED IMMEDIATELY:  *FEVER GREATER THAN 100.5 F  *CHILLS WITH OR WITHOUT FEVER  NAUSEA AND VOMITING THAT IS NOT CONTROLLED WITH YOUR NAUSEA MEDICATION  *UNUSUAL SHORTNESS OF BREATH  *UNUSUAL BRUISING OR BLEEDING  TENDERNESS IN MOUTH AND THROAT WITH OR WITHOUT PRESENCE OF ULCERS  *URINARY PROBLEMS  *BOWEL PROBLEMS  UNUSUAL RASH Items with * indicate a potential emergency and should be followed up as soon as possible.  Feel free to call the clinic should you have any questions or concerns. The clinic phone number is [phone].  Please show the CHEMO ALERT CARD at check-in to the Emergency Department and triage nurse.   

## 2018-08-11 ENCOUNTER — Other Ambulatory Visit: Payer: Self-pay | Admitting: Family Medicine

## 2018-08-11 ENCOUNTER — Telehealth: Payer: Self-pay | Admitting: Family Medicine

## 2018-08-11 MED ORDER — TRAMADOL HCL 50 MG PO TABS: ORAL_TABLET | ORAL | 0 refills | Status: AC

## 2018-08-11 NOTE — Progress Notes (Unsigned)
Tramadol 50  

## 2018-08-11 NOTE — Telephone Encounter (Signed)
Please let her know thjat tramadol is sent to her local pharmacy, same direction 30 tablets to last 3 months

## 2018-08-13 NOTE — Telephone Encounter (Signed)
Pt aware.

## 2018-08-17 ENCOUNTER — Encounter: Payer: Medicare HMO | Admitting: Family Medicine

## 2018-08-18 ENCOUNTER — Telehealth: Payer: Self-pay | Admitting: *Deleted

## 2018-08-18 NOTE — Telephone Encounter (Signed)
This was sent to [facility] and pt aware

## 2018-08-18 NOTE — Telephone Encounter (Signed)
Received [facility] fax from [facility] for tramadol hcl tabs 50 mg refill

## 2018-08-25 ENCOUNTER — Inpatient Hospital Stay (HOSPITAL_BASED_OUTPATIENT_CLINIC_OR_DEPARTMENT_OTHER): Payer: Medicare HMO | Admitting: Hematology

## 2018-08-25 ENCOUNTER — Inpatient Hospital Stay (HOSPITAL_COMMUNITY): Payer: Medicare HMO | Attending: Hematology

## 2018-08-25 ENCOUNTER — Encounter (HOSPITAL_COMMUNITY): Payer: Self-pay | Admitting: Hematology

## 2018-08-25 ENCOUNTER — Encounter (HOSPITAL_COMMUNITY): Payer: Self-pay

## 2018-08-25 ENCOUNTER — Other Ambulatory Visit: Payer: Self-pay

## 2018-08-25 ENCOUNTER — Inpatient Hospital Stay (HOSPITAL_COMMUNITY): Payer: Medicare HMO

## 2018-08-25 VITALS — BP 167/58 | HR 53 | Temp 97.7°F | Resp 18

## 2018-08-25 DIAGNOSIS — Z79899 Other long term (current) drug therapy: Secondary | ICD-10-CM | POA: Diagnosis not present

## 2018-08-25 DIAGNOSIS — C787 Secondary malignant neoplasm of liver and intrahepatic bile duct: Secondary | ICD-10-CM | POA: Insufficient documentation

## 2018-08-25 DIAGNOSIS — C259 Malignant neoplasm of pancreas, unspecified: Secondary | ICD-10-CM | POA: Diagnosis not present

## 2018-08-25 DIAGNOSIS — Z5111 Encounter for antineoplastic chemotherapy: Secondary | ICD-10-CM | POA: Insufficient documentation

## 2018-08-25 DIAGNOSIS — K5900 Constipation, unspecified: Secondary | ICD-10-CM

## 2018-08-25 DIAGNOSIS — F17210 Nicotine dependence, cigarettes, uncomplicated: Secondary | ICD-10-CM

## 2018-08-25 DIAGNOSIS — R634 Abnormal weight loss: Secondary | ICD-10-CM

## 2018-08-25 DIAGNOSIS — C252 Malignant neoplasm of tail of pancreas: Secondary | ICD-10-CM

## 2018-08-25 DIAGNOSIS — R69 Illness, unspecified: Secondary | ICD-10-CM | POA: Diagnosis not present

## 2018-08-25 DIAGNOSIS — L219 Seborrheic dermatitis, unspecified: Secondary | ICD-10-CM | POA: Diagnosis not present

## 2018-08-25 LAB — COMPREHENSIVE METABOLIC PANEL WITH GFR
ALT: 14 U/L (ref 0–44)
AST: 19 U/L (ref 15–41)
Albumin: 3.7 g/dL (ref 3.5–5.0)
Alkaline Phosphatase: 163 U/L — ABNORMAL HIGH (ref 38–126)
Anion gap: 8 (ref 5–15)
BUN: 35 mg/dL — ABNORMAL HIGH (ref 8–23)
CO2: 24 mmol/L (ref 22–32)
Calcium: 8.4 mg/dL — ABNORMAL LOW (ref 8.9–10.3)
Chloride: 107 mmol/L (ref 98–111)
Creatinine, Ser: 1.46 mg/dL — ABNORMAL HIGH (ref 0.44–1.00)
GFR calc Af Amer: 37 mL/min — ABNORMAL LOW
GFR calc non Af Amer: 32 mL/min — ABNORMAL LOW
Glucose, Bld: 118 mg/dL — ABNORMAL HIGH (ref 70–99)
Potassium: 4.4 mmol/L (ref 3.5–5.1)
Sodium: 139 mmol/L (ref 135–145)
Total Bilirubin: 0.6 mg/dL (ref 0.3–1.2)
Total Protein: 7.1 g/dL (ref 6.5–8.1)

## 2018-08-25 LAB — CBC WITH DIFFERENTIAL/PLATELET
Abs Immature Granulocytes: 0 10*3/uL (ref 0.00–0.07)
Basophils Absolute: 0 10*3/uL (ref 0.0–0.1)
Basophils Relative: 1 %
Eosinophils Absolute: 0.3 10*3/uL (ref 0.0–0.5)
Eosinophils Relative: 8 %
HCT: 31.5 % — ABNORMAL LOW (ref 36.0–46.0)
Hemoglobin: 10.2 g/dL — ABNORMAL LOW (ref 12.0–15.0)
Immature Granulocytes: 0 %
Lymphocytes Relative: 25 %
Lymphs Abs: 1 10*3/uL (ref 0.7–4.0)
MCH: 29.9 pg (ref 26.0–34.0)
MCHC: 32.4 g/dL (ref 30.0–36.0)
MCV: 92.4 fL (ref 80.0–100.0)
Monocytes Absolute: 0.6 10*3/uL (ref 0.1–1.0)
Monocytes Relative: 15 %
Neutro Abs: 2.1 10*3/uL (ref 1.7–7.7)
Neutrophils Relative %: 51 %
Platelets: 150 10*3/uL (ref 150–400)
RBC: 3.41 MIL/uL — ABNORMAL LOW (ref 3.87–5.11)
RDW: 16.9 % — ABNORMAL HIGH (ref 11.5–15.5)
WBC: 4.2 10*3/uL (ref 4.0–10.5)
nRBC: 0 % (ref 0.0–0.2)

## 2018-08-25 LAB — MAGNESIUM: Magnesium: 2.3 mg/dL (ref 1.7–2.4)

## 2018-08-25 LAB — LACTATE DEHYDROGENASE: LDH: 145 U/L (ref 98–192)

## 2018-08-25 MED ORDER — SODIUM CHLORIDE 0.9 % IV SOLN
50.0000 mg/m2 | Freq: Once | INTRAVENOUS | Status: AC
  Administered 2018-08-25: 81.7 mg via INTRAVENOUS
  Filled 2018-08-25: qty 19

## 2018-08-25 MED ORDER — SODIUM CHLORIDE 0.9 % IV SOLN
Freq: Once | INTRAVENOUS | Status: AC
  Administered 2018-08-25: 10:00:00 via INTRAVENOUS

## 2018-08-25 MED ORDER — LEUCOVORIN CALCIUM INJECTION 350 MG
700.0000 mg | Freq: Once | INTRAVENOUS | Status: AC
  Administered 2018-08-25: 13:00:00 700 mg via INTRAVENOUS
  Filled 2018-08-25: qty 35

## 2018-08-25 MED ORDER — SODIUM CHLORIDE 0.9 % IV SOLN
Freq: Once | INTRAVENOUS | Status: AC
  Administered 2018-08-25: 10:00:00 via INTRAVENOUS
  Filled 2018-08-25: qty 4

## 2018-08-25 MED ORDER — SODIUM CHLORIDE 0.9 % IV SOLN
1920.0000 mg/m2 | INTRAVENOUS | Status: DC
  Administered 2018-08-25: 14:00:00 3100 mg via INTRAVENOUS
  Filled 2018-08-25: qty 62

## 2018-08-25 NOTE — Progress Notes (Signed)
[name], [place] [id number]   CLINIC:  Medical Oncology/Hematology  PCP:  Fayrene Helper, MD [address], [place] [name] [id number] [phone]   REASON FOR VISIT:  Follow-up for pancreatic cancer.   BRIEF ONCOLOGIC HISTORY:    Pancreatic cancer metastasized to liver ([place])   03/10/2014 Initial Diagnosis    Pancreatic cancer metastasized to liver    03/22/2014 - 03/05/2016 Chemotherapy    Abraxane/Gemzar days 1, 8, every 28 days.  Day 15 was cancelled due to leukopenia and thrombocytopenia on day 15 cycle 1.    05/24/2014 Treatment Plan Change    Day 8 of cycle 3 is held with ANC of 1.1    06/05/2014 Imaging    CT C/A/P . Interval decrease in size of the pancreatic tail mass.Improved hepatic metastatic disease. No new lesions. No CT findings for metastatic disease involving the chest.    08/09/2014 Tumor Marker    CA 19-9= 33 (WNL)    10/26/2014 Imaging    MRI- Continued interval decrease in size of the hepatic metastatic lesions and no new lesions are identified. Continued decrease in size of the pancreatic tail lesion.    01/03/2015 Tumor Marker    Results for [name], [name] (MRN [id number]) as of 01/18/2015 12:52  01/03/2015 10:00 CA 19-9: 14     01/17/2015 Imaging    MRI- Response to therapy of hepatic metastasis.  Similar size of a pancreatic tail lesion.    03/20/2015 Imaging    MRI-L spine- Severe disc space narrowing at L2-L3, with endplate reactive changes. Large disc extrusion into the ventral epidural space,central to the RIGHT with a cephalad migrated free fragment. Additional Large disc extrusion into the retroperitone...    03/22/2015 Imaging    CT pelvis- No evidence for metastatic disease within the pelvis.    07/24/2015 Imaging    MRI abd- Continued response to therapy, with no residual detectable liver metastases. No new sites of metastatic disease in the abdomen.     08/15/2015 Code Status     She  confirms desire for DNR status.    12/21/2015 Imaging    MRI liver- Severe image degradation due to motion artifact reducing diagnostic sensitivity and specificity. Reduce conspicuity of the pancreatic tail lesion suggesting further improvement. The original liver lesions have resolved.    03/05/2016 Treatment Plan Change    Chemotherapy holiday/Break after 2 years worth of treatment    04/07/2016 Imaging    CT chest- When compared to recent chest CT, new minimally displaced anterior left sixth rib fracture. Slight increase in subcarinal adenopathy    04/28/2016 Imaging    Bone density- BMD as determined from Femur Neck Right is 0.705 g/cm2 with a T-Score of -2.4. This patient is considered osteopenic according to [facility]) criteria. Compared with the prior study on 02/23/2013, the BMD of the lumbar spine/rt. femoral neck show a statistically significant decrease.     05/23/2016 Imaging    MRI abd- 1. Exam is significantly degraded by patient respiratory motion. Consider follow-up exams with CT abdomen with without contrast per pancreatic protocol. 2. Fullness in tail of pancreas is more prominent and potentially increased in size. Recommend close attention on follow-up. (Consider CT as above) 3. No explanation for back pain.    09/11/2016 Imaging    MRI pancreas- Interval progression of pancreatic tail lesion. New differential perfusion right liver with areas of heterogeneity. Underlying metastatic disease in this region not  excluded.    09/11/2016 Progression    MRi in conjunction with rising CA 19-9 are indicative of relapse of disease.    01/09/2017 Progression    CT C/A/P: 2.1 x 2.9 cm lesion in the pancreatic tail and abutting the splenic hilum, corresponding to known primary pancreatic neoplasm, mildly increased.  Scattered small hypoenhancing lesions in the liver measuring up to 10 mm, approximately 8-10 in number, suspicious for  hepatic metastases.  No findings specific for metastatic disease in the chest.  Additional ancillary findings as above.     01/27/2017 - 10/20/2017 Chemotherapy    The patient had gemcitabine (GEMZAR) 1,292 mg in sodium chloride 0.9 % 250 mL chemo infusion, 800 mg/m2 = 1,292 mg, Intravenous,  Once, 9 of 12 cycles Administration: 1,292 mg (01/28/2017), 1,292 mg (02/11/2017), 1,292 mg (02/25/2017), 1,292 mg (03/11/2017), 1,292 mg (03/25/2017), 1,292 mg (04/08/2017), 1,292 mg (04/22/2017), 1,292 mg (04/29/2017), 1,292 mg (05/20/2017), 1,292 mg (06/03/2017), 1,292 mg (06/24/2017), 1,292 mg (07/08/2017), 1,292 mg (07/29/2017), 1,292 mg (08/12/2017), 1,292 mg (08/26/2017), 1,292 mg (09/09/2017), 1,292 mg (09/23/2017), 1,292 mg (10/07/2017)  for chemotherapy treatment.     10/21/2017 -  Chemotherapy    The patient had leucovorin 700 mg in dextrose 5 % 250 mL infusion, 644 mg, Intravenous,  Once, 20 of 22 cycles Administration: 700 mg (10/21/2017), 700 mg (11/04/2017), 700 mg (11/18/2017), 700 mg (12/02/2017), 700 mg (12/16/2017), 700 mg (12/30/2017), 700 mg (01/13/2018), 700 mg (01/27/2018), 700 mg (02/10/2018), 700 mg (02/24/2018), 700 mg (03/10/2018), 700 mg (03/24/2018), 700 mg (04/28/2018), 700 mg (05/17/2018), 700 mg (05/31/2018), 700 mg (06/16/2018), 700 mg (06/30/2018), 700 mg (07/21/2018), 700 mg (08/04/2018), 700 mg (08/25/2018) ondansetron (ZOFRAN) 8 mg in sodium chloride 0.9 % 50 mL IVPB, , Intravenous,  Once, 19 of 21 cycles Administration:  (11/04/2017),  (01/27/2018),  (02/10/2018),  (02/24/2018),  (03/10/2018),  (03/24/2018),  (04/28/2018),  (05/17/2018),  (05/31/2018),  (06/16/2018),  (06/30/2018),  (07/21/2018),  (08/04/2018),  (08/25/2018) fluorouracil (ADRUCIL) 3,850 mg in sodium chloride 0.9 % 73 mL chemo infusion, 2,400 mg/m2 = 3,850 mg, Intravenous, 1 Day/Dose, 20 of 22 cycles Dose modification: 1,920 mg/m2 (original dose 2,400 mg/m2, Cycle 2, Reason: Provider Judgment, Comment: mucisitis) Administration: 3,850 mg (10/21/2017),  3,100 mg (11/04/2017), 3,100 mg (11/18/2017), 3,100 mg (12/02/2017), 3,100 mg (12/16/2017), 3,100 mg (12/30/2017), 3,100 mg (01/13/2018), 3,100 mg (01/27/2018), 3,100 mg (02/10/2018), 3,100 mg (02/24/2018), 3,100 mg (03/10/2018), 3,100 mg (03/24/2018), 3,100 mg (04/28/2018), 3,100 mg (05/17/2018), 3,100 mg (05/31/2018), 3,100 mg (06/16/2018), 3,100 mg (06/30/2018), 3,100 mg (07/21/2018), 3,100 mg (08/04/2018), 3,100 mg (08/25/2018) irinotecan LIPOSOME (ONIVYDE) 81.7 mg in sodium chloride 0.9 % 500 mL chemo infusion, 50 mg/m2 = 81.7 mg (100 % of original dose 50 mg/m2), Intravenous, Once, 20 of 22 cycles Dose modification: 50 mg/m2 (original dose 50 mg/m2, Cycle 1, Reason: Provider Judgment), 50 mg/m2 (original dose 50 mg/m2, Cycle 2, Reason: Provider Judgment) Administration: 81.7 mg (10/21/2017), 81.7 mg (11/04/2017), 81.7 mg (11/18/2017), 81.7 mg (12/02/2017), 81.7 mg (12/16/2017), 81.7 mg (12/30/2017), 81.7 mg (01/13/2018), 81.7 mg (01/27/2018), 81.7 mg (02/10/2018), 81.7 mg (02/24/2018), 81.7 mg (03/10/2018), 81.7 mg (03/24/2018), 81.7 mg (04/28/2018), 81.7 mg (05/17/2018), 81.7 mg (05/31/2018), 81.7 mg (06/16/2018), 81.7 mg (06/30/2018), 81.7 mg (07/21/2018), 81.7 mg (08/04/2018), 81.7 mg (08/25/2018)  for chemotherapy treatment.       CANCER STAGING: Cancer Staging Pancreatic cancer metastasized to liver [facility]) Staging form: Pancreas, AJCC 7th Edition - Clinical: Stage IV (T3, N1, M1) - Signed by Baird Cancer, PA-C on 03/16/2014 - Pathologic: No stage assigned -  Unsigned    INTERVAL HISTORY:  Ms. Ungar 85 y.o. female returns for routine follow-up and consideration for next cycle of chemotherapy.She is here today alone. She states that she is still having some itching but she has been to the dermatologist and they gave her a cream to use, it has gotten better but not gone away. She states that she has constipation but doesn't take a stool softener daily. Denies any nausea, vomiting, or diarrhea. Denies any new pains. Had not  noticed any recent bleeding such as epistaxis, hematuria or hematochezia. Denies recent chest pain on exertion, shortness of breath on minimal exertion, pre-syncopal episodes, or palpitations. Denies any numbness or tingling in hands or feet. Denies any recent fevers, infections, or recent hospitalizations. Patient reports appetite at 75% and energy level at 75%.   REVIEW OF SYSTEMS:  Review of Systems  Gastrointestinal: Positive for constipation.  Skin: Positive for itching.     PAST MEDICAL/SURGICAL HISTORY:  Past Medical History:  Diagnosis Date   Anemia due to antineoplastic chemotherapy 09/12/2015   Started Aranesp 500 mcg on 09/12/2015   Anxiety    Chronic diarrhea    Depression    DNR (do not resuscitate) 08/16/2015   Erosive esophagitis    GERD (gastroesophageal reflux disease)    Hx of adenomatous colonic polyps    tubular adenomas, last found in 2008   Hyperplastic colon polyp 03/19/10   tcs by Dr. Gala Romney   Hypertension 20 years    Kidney stone    hx/ crushed    Opioid contract exists 04/18/2015   With Dr. Moshe Cipro   Pancreatic cancer [facility]) 02/2014   Pancreatic cancer metastasized to liver ([place]) 03/10/2014   Schatzki's ring 08/26/10   Last dilated on EGD by Dr. Trevor Iha [facility], linear gastric erosions, BI hemigastrectomy   Past Surgical History:  Procedure Laterality Date   BREAST LUMPECTOMY Left    bunion removal     from both [place]  03/19/2010   DR Gala Romney,, normal TI, pancolonic diverticula, random colon bx neg., hyperplastic polyps removed   ESOPHAGOGASTRODUODENOSCOPY  11/22/2003   DR Gala Romney, erosive RE, Billroth I   ESOPHAGOGASTRODUODENOSCOPY  08/26/10   Dr. Gala Romney- moderate severe ERE, Scahtzki ring s/p dilation, Billroth I, linear gastric erosions, bx-gastric xanthelasma   LEFT SHOULDER SURGERY  2009   DR HARRISON   ORIF ANKLE FRACTURE Right 05/22/2013   Procedure: OPEN REDUCTION INTERNAL FIXATION (ORIF)  RIGHT ANKLE FRACTURE;  Surgeon: Sanjuana Kava, MD;  Location: [facility];  Service: Orthopedics;  Laterality: Right;   stomach ulcer  50 years ago    had some of her stomach removed      SOCIAL HISTORY:  Social History   Socioeconomic History   Marital status: Widowed    Spouse name: Not on file   Number of children: 2   Years of education: 19   Highest education level: 12th grade  Occupational History   Occupation: retired from Estate manager/land agent: [place] resource strain: Not hard at all   Food insecurity:    Worry: Never true    Inability: Never true   Transportation needs:    Medical: No    Non-medical: No  Tobacco Use   Smoking status: Current Some Day Smoker    Packs/day: 0.50    Years: 60.00    Pack years: 30.00    Types: Cigarettes    Last attempt to  quit: 09/09/2016    Years since quitting: 1.9   Smokeless tobacco: Never Used  Substance and Sexual Activity   Alcohol use: No   Drug use: No   Sexual activity: Not Currently  Lifestyle   Physical activity:    Days per week: 3 days    Minutes per session: 30 min   Stress: Not at all  Relationships   Social connections:    Talks on phone: More than three times a week    Gets together: Three times a week    Attends religious service: 1 to 4 times per year    Active member of club or organization: No    Attends meetings of clubs or organizations: Never    Relationship status: Widowed   Intimate partner violence:    Fear of current or ex partner: No    Emotionally abused: No    Physically abused: No    Forced sexual activity: No  Other Topics Concern   Not on file  Social History Narrative   Lives with son alone     FAMILY HISTORY:  Family History  Problem Relation Age of Onset   Hypertension Mother    Kidney failure Brother        on dialysis   Diabetes Sister    Diabetes Sister    Hypertension Father    Kidney failure Sister    Kidney Stones  Brother    Breast cancer Son    Gout Son    Liver disease Neg Hx    Colon cancer Neg Hx     CURRENT MEDICATIONS:  Outpatient Encounter Medications as of 08/25/2018  Medication Sig Note   acetaminophen (TYLENOL) 500 MG tablet Take 500 mg by mouth every 6 (six) hours as needed for mild pain or moderate pain.     amLODipine (NORVASC) 10 MG tablet Take 1 tablet (10 mg total) by mouth daily.    clonazePAM (KLONOPIN) 0.5 MG tablet Take one tablet daily for anxiety    cycloSPORINE (RESTASIS) 0.05 % ophthalmic emulsion Place 1 drop into both eyes 2 (two) times daily.    diphenhydrAMINE (BENADRYL) 50 MG tablet Take 1 tablet 1 hour prior to CT 07/21/2018: Currently taking around the clock due to itching   fluorometholone (FML) 0.1 % ophthalmic suspension INSTILL 1 DROP INTO EACH EYE THREE TIMES DAILY FOR 14 DAYS    lidocaine-prilocaine (EMLA) cream Apply 1 application topically as needed. Apply to portacath site as needed    metoprolol tartrate (LOPRESSOR) 25 MG tablet Take one tablet once daily for blood pressure    mirabegron ER (MYRBETRIQ) 50 MG TB24 tablet Take 1 tablet (50 mg total) by mouth daily.    pantoprazole (PROTONIX) 20 MG tablet Take 1 tablet by mouth daily    predniSONE (STERAPRED UNI-PAK 21 TAB) 5 MG (21) TBPK tablet Use as directed on package 07/21/2018: Taking for itching on her neck and head   traMADol (ULTRAM) 50 MG tablet Take one tablet once daily, as needed, for uncontrolled arthritic pain    [DISCONTINUED] prochlorperazine (COMPAZINE) 10 MG tablet Take 1 tablet (10 mg total) by mouth every 6 (six) hours as needed (Nausea or vomiting).    Facility-Administered Encounter Medications as of 08/25/2018  Medication   sodium chloride flush (NS) 0.9 % injection 10 mL    ALLERGIES:  Allergies  Allergen Reactions   Iohexol Hives and Shortness Of Breath    **Patient can not have IV contrast, pt has breakthrough reaction even after 13 hour  premeds** Do not give IV  contrast PATIENT HAD TO BE TAKEN TO ED, C/O WHELPS, HIVES, DIFFICULTY BREATHING. PATIENT GIVEN IV CONTRAST AFTER 13 HOUR PRE MEDS ON 01/09/2017 WITH NO REACTION NOTED. On 10/19/17 patient had reaction despite premedication.     Aciphex [Rabeprazole Sodium] Other (See Comments)    unknown   Amlodipine Besylate-Valsartan     Rash to high-dose (5/320)   Esomeprazole Magnesium    Omeprazole Other (See Comments)    Patient states "medication didn't work"   Ciprofloxacin Rash   Penicillins Swelling and Rash    Has patient had a PCN reaction causing immediate rash, facial/tongue/throat swelling, SOB or lightheadedness with hypotension: Yes Has patient had a PCN reaction causing severe rash involving mucus membranes or skin necrosis: Yes Has patient had a PCN reaction that required hospitalization: No Has patient had a PCN reaction occurring within the last 10 years: yes If all of the above answers are "NO", then may proceed with Cephalosporin use.    Tessalon Perles [Benzonatate] Itching    Full head, face, neck, and chest itching.      PHYSICAL EXAM:  ECOG Performance status: 1  Vitals:   08/25/18 0830  BP: (!) 137/56  Pulse: (!) 51  Resp: 18  Temp: 98.2 F (36.8 C)  SpO2: 97%   Filed Weights   08/25/18 0830  Weight: 123 lb (55.8 kg)    Physical Exam Vitals signs reviewed.  Constitutional:      Appearance: Normal appearance.  Cardiovascular:     Rate and Rhythm: Normal rate and regular rhythm.     Heart sounds: Normal heart sounds.  Pulmonary:     Effort: Pulmonary effort is normal.     Breath sounds: Normal breath sounds.  Abdominal:     General: Bowel sounds are normal.     Palpations: Abdomen is soft.  Musculoskeletal:        General: No swelling.  Skin:    General: Skin is warm.  Neurological:     General: No focal deficit present.     Mental Status: She is alert and oriented to person, place, and time.  Psychiatric:        Mood and Affect: Mood normal.         Behavior: Behavior normal.      LABORATORY DATA:  I have reviewed the labs as listed.  CBC    Component Value Date/Time   WBC 4.2 08/25/2018 0818   RBC 3.41 (L) 08/25/2018 0818   HGB 10.2 (L) 08/25/2018 0818   HCT 31.5 (L) 08/25/2018 0818   PLT 150 08/25/2018 0818   MCV 92.4 08/25/2018 0818   MCH 29.9 08/25/2018 0818   MCHC 32.4 08/25/2018 0818   RDW 16.9 (H) 08/25/2018 0818   LYMPHSABS 1.0 08/25/2018 0818   MONOABS 0.6 08/25/2018 0818   EOSABS 0.3 08/25/2018 0818   BASOSABS 0.0 08/25/2018 0818   CMP Latest Ref Rng & Units 08/25/2018 08/04/2018 07/21/2018  Glucose 70 - 99 mg/dL 118(H) 133(H) 102(H)  BUN 8 - 23 mg/dL 35(H) 27(H) 25(H)  Creatinine 0.44 - 1.00 mg/dL 1.46(H) 1.34(H) 1.69(H)  Sodium 135 - 145 mmol/L 139 140 141  Potassium 3.5 - 5.1 mmol/L 4.4 3.9 4.1  Chloride 98 - 111 mmol/L 107 109 106  CO2 22 - 32 mmol/L '24 25 27  ' Calcium 8.9 - 10.3 mg/dL 8.4(L) 8.5(L) 8.7(L)  Total Protein 6.5 - 8.1 g/dL 7.1 7.0 6.8  Total Bilirubin 0.3 - 1.2 mg/dL 0.6 0.7 0.4  Alkaline Phos 38 - 126 U/L 163(H) 147(H) 141(H)  AST 15 - 41 U/L '19 18 16  ' ALT 0 - 44 U/L '14 13 11       ' DIAGNOSTIC IMAGING:  I have independently reviewed the scans and discussed with the patient.   I have reviewed Venita Lick LPN's note and agree with the documentation.  I personally performed a face-to-face visit, made revisions and my assessment and plan is as follows.    ASSESSMENT & PLAN:   Pancreatic cancer metastasized to liver ([place]) 1.  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 28 days from 01/28/2017 until 10/07/2017 with progression. - CT CAP on 10/19/2017 showed progression of liver lesions which are very subtle.  Progression in the tail of the  pancreas lesion measuring 27 x 27 mm.  Her tumor markers were also going up.  Hence a treatment change was recommended.  - Cycle 1 of infusional 5-FU and Onivyde (50 mg/m) started on 10/21/2017, dose of infusional 5-FU decreased by 20% after cycle 1 secondary to mucositis.  - CT CAP on 06/28/2018 shows stable appearance of focal thickening in the tail of the pancreas extending into the splenic hilum.  Liver metastasis are similar with no progression.  No new liver lesions. - She was found to have rash over the scalp and neck region.  She was evaluated by allergist and a diagnosis of seborrheic dermatitis was made.  Fluticasone topical cream was given twice daily.  She does report occasional itching from it. -I have reviewed her blood work.  CA 19-9 was 104 on 07/21/2018. -She is continuing to tolerate chemotherapy very well. -She may proceed with her next cycle today.  We will consider re-scans 3 months from the last.  We will see her back in 2 weeks for follow-up.  2.  Weight loss: -She gained some weight today.  She is 123 pounds. -She is continuing Ensure 3 cans/day.  3.  Genetic testing: -We will test her for BRCA1/2 and MSI.   Total time spent is 25 minutes with more than 50% of the time spent face-to-face discussing chemotherapy related side effects, duration of therapy and coordination of care.    Orders placed this encounter:  No orders of the defined types were placed in this encounter.     Derek Jack, MD [place] [phone]

## 2018-08-25 NOTE — Patient Instructions (Signed)
[facility] Discharge Instructions for Patients Receiving Chemotherapy  Today you received the following chemotherapy agents   To help prevent nausea and vomiting after your treatment, we encourage you to take your nausea medication   If you develop nausea and vomiting that is not controlled by your nausea medication, call the clinic.   BELOW ARE SYMPTOMS THAT SHOULD BE REPORTED IMMEDIATELY:  *FEVER GREATER THAN 100.5 F  *CHILLS WITH OR WITHOUT FEVER  NAUSEA AND VOMITING THAT IS NOT CONTROLLED WITH YOUR NAUSEA MEDICATION  *UNUSUAL SHORTNESS OF BREATH  *UNUSUAL BRUISING OR BLEEDING  TENDERNESS IN MOUTH AND THROAT WITH OR WITHOUT PRESENCE OF ULCERS  *URINARY PROBLEMS  *BOWEL PROBLEMS  UNUSUAL RASH Items with * indicate a potential emergency and should be followed up as soon as possible.  Feel free to call the clinic should you have any questions or concerns. The clinic phone number is [phone].  Please show the CHEMO ALERT CARD at check-in to the Emergency Department and triage nurse.   

## 2018-08-25 NOTE — Progress Notes (Signed)
Pt seen by Dr. Delton Coombes. Labs reviewed. Message received to proceed with treatment per ATravis LPN. VS and labs within parameters.    Treatment given today per MD orders. Tolerated infusion without adverse affects. Vital signs stable. No complaints at this time. Discharged from clinic ambulatory. 5FU pump infusing per protocol. F/U with [facility] as scheduled.

## 2018-08-25 NOTE — Assessment & Plan Note (Signed)
1.  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 28 days from 01/28/2017 until 10/07/2017 with progression. - CT CAP on 10/19/2017 showed progression of liver lesions which are very subtle.  Progression in the tail of the pancreas lesion measuring 27 x 27 mm.  Her tumor markers were also going up.  Hence a treatment change was recommended.  - Cycle 1 of infusional 5-FU and Onivyde (50 mg/m) started on 10/21/2017, dose of infusional 5-FU decreased by 20% after cycle 1 secondary to mucositis.  - CT CAP on 06/28/2018 shows stable appearance of focal thickening in the tail of the pancreas extending into the splenic hilum.  Liver metastasis are similar with no progression.  No new liver lesions. - She was found to have rash over the scalp and neck region.  She was evaluated by allergist and a diagnosis of seborrheic dermatitis was made.  Fluticasone topical cream was given twice daily.  She does report occasional itching from it. -I have reviewed her blood work.  CA 19-9 was 104 on 07/21/2018. -She is continuing to tolerate chemotherapy very well. -She may proceed with her next cycle today.  We will consider re-scans 3 months from the last.  We will see her back in 2 weeks for follow-up.  2.  Weight loss: -She gained some weight today.  She is 123 pounds. -She is continuing Ensure 3 cans/day.  3.  Genetic testing: -We will test her for BRCA1/2 and MSI.

## 2018-08-25 NOTE — Patient Instructions (Signed)
[facility] Discharge Instructions  You were seen today by Dr. Katragadda. He went over your recent lab results. He will see you back in 2 weeks for labs, treatment and follow up.   Thank you for choosing [facility] at [facility] to provide your oncology and hematology care.  To afford each patient quality time with our provider, please arrive at least 15 minutes before your scheduled appointment time.   If you have a lab appointment with the Cancer Center please come in thru the  Main Entrance and check in at the main information desk  You need to re-schedule your appointment should you arrive 10 or more minutes late.  We strive to give you quality time with our providers, and arriving late affects you and other patients whose appointments are after yours.  Also, if you no show three or more times for appointments you may be dismissed from the clinic at the providers discretion.     Again, thank you for choosing [facility].  Our hope is that these requests will decrease the amount of time that you wait before being seen by our physicians.       _____________________________________________________________  Should you have questions after your visit to [facility], please contact our office at [phone] between the hours of 8:00 a.m. and 4:30 p.m.  Voicemails left after 4:00 p.m. will not be returned until the following business day.  For prescription refill requests, have your pharmacy contact our office and allow 72 hours.    Cancer Center Support Programs:   > Cancer Support Group  2nd Tuesday of the month 1pm-2pm, Journey Room    

## 2018-08-26 LAB — CANCER ANTIGEN 19-9: CA 19-9: 171 U/mL — ABNORMAL HIGH (ref 0–35)

## 2018-08-27 ENCOUNTER — Inpatient Hospital Stay (HOSPITAL_COMMUNITY): Payer: Medicare HMO

## 2018-08-27 ENCOUNTER — Other Ambulatory Visit: Payer: Self-pay

## 2018-08-27 VITALS — BP 140/51 | HR 51 | Temp 98.0°F | Resp 18

## 2018-08-27 DIAGNOSIS — C259 Malignant neoplasm of pancreas, unspecified: Secondary | ICD-10-CM

## 2018-08-27 DIAGNOSIS — C787 Secondary malignant neoplasm of liver and intrahepatic bile duct: Principal | ICD-10-CM

## 2018-08-27 MED ORDER — HEPARIN SOD (PORK) LOCK FLUSH 100 UNIT/ML IV SOLN: 500.0000 [IU] | Freq: Once | INTRAVENOUS | Status: DC | PRN

## 2018-08-27 MED ORDER — SODIUM CHLORIDE 0.9% FLUSH: 10.0000 mL | INTRAVENOUS | Status: DC | PRN

## 2018-08-27 NOTE — Progress Notes (Signed)
[name] returns today for port de access and flush after 46 hr continous infusion of 57fu. Tolerated infusion without problems. Portacath located right chest wall was  deaccessed and flushed with 7ml NS and 500U/84ml Heparin and needle removed intact.  Procedure without incident. Patient tolerated procedure well.

## 2018-09-08 ENCOUNTER — Inpatient Hospital Stay (HOSPITAL_BASED_OUTPATIENT_CLINIC_OR_DEPARTMENT_OTHER): Payer: Medicare HMO | Admitting: Hematology

## 2018-09-08 ENCOUNTER — Encounter (HOSPITAL_COMMUNITY): Payer: Self-pay | Admitting: Hematology

## 2018-09-08 ENCOUNTER — Inpatient Hospital Stay (HOSPITAL_COMMUNITY): Payer: Medicare HMO

## 2018-09-08 ENCOUNTER — Other Ambulatory Visit: Payer: Self-pay

## 2018-09-08 VITALS — BP 159/70 | HR 56 | Temp 98.0°F | Resp 18 | Wt 123.4 lb

## 2018-09-08 DIAGNOSIS — C259 Malignant neoplasm of pancreas, unspecified: Secondary | ICD-10-CM

## 2018-09-08 DIAGNOSIS — R69 Illness, unspecified: Secondary | ICD-10-CM | POA: Diagnosis not present

## 2018-09-08 DIAGNOSIS — Z79899 Other long term (current) drug therapy: Secondary | ICD-10-CM | POA: Diagnosis not present

## 2018-09-08 DIAGNOSIS — Z5111 Encounter for antineoplastic chemotherapy: Secondary | ICD-10-CM | POA: Diagnosis not present

## 2018-09-08 DIAGNOSIS — G479 Sleep disorder, unspecified: Secondary | ICD-10-CM

## 2018-09-08 DIAGNOSIS — L219 Seborrheic dermatitis, unspecified: Secondary | ICD-10-CM

## 2018-09-08 DIAGNOSIS — C252 Malignant neoplasm of tail of pancreas: Secondary | ICD-10-CM | POA: Diagnosis not present

## 2018-09-08 DIAGNOSIS — R634 Abnormal weight loss: Secondary | ICD-10-CM | POA: Diagnosis not present

## 2018-09-08 DIAGNOSIS — C787 Secondary malignant neoplasm of liver and intrahepatic bile duct: Secondary | ICD-10-CM | POA: Diagnosis not present

## 2018-09-08 DIAGNOSIS — F17210 Nicotine dependence, cigarettes, uncomplicated: Secondary | ICD-10-CM

## 2018-09-08 LAB — MAGNESIUM: Magnesium: 2.2 mg/dL (ref 1.7–2.4)

## 2018-09-08 LAB — CBC WITH DIFFERENTIAL/PLATELET
Abs Immature Granulocytes: 0.01 10*3/uL (ref 0.00–0.07)
Basophils Absolute: 0 10*3/uL (ref 0.0–0.1)
Basophils Relative: 0 %
Eosinophils Absolute: 0.3 10*3/uL (ref 0.0–0.5)
Eosinophils Relative: 6 %
HCT: 31.3 % — ABNORMAL LOW (ref 36.0–46.0)
Hemoglobin: 10.1 g/dL — ABNORMAL LOW (ref 12.0–15.0)
Immature Granulocytes: 0 %
Lymphocytes Relative: 16 %
Lymphs Abs: 0.9 10*3/uL (ref 0.7–4.0)
MCH: 30.3 pg (ref 26.0–34.0)
MCHC: 32.3 g/dL (ref 30.0–36.0)
MCV: 94 fL (ref 80.0–100.0)
Monocytes Absolute: 0.4 10*3/uL (ref 0.1–1.0)
Monocytes Relative: 7 %
Neutro Abs: 3.8 10*3/uL (ref 1.7–7.7)
Neutrophils Relative %: 71 %
Platelets: 124 10*3/uL — ABNORMAL LOW (ref 150–400)
RBC: 3.33 MIL/uL — ABNORMAL LOW (ref 3.87–5.11)
RDW: 16.8 % — ABNORMAL HIGH (ref 11.5–15.5)
WBC: 5.3 10*3/uL (ref 4.0–10.5)
nRBC: 0 % (ref 0.0–0.2)

## 2018-09-08 LAB — COMPREHENSIVE METABOLIC PANEL
ALT: 12 U/L (ref 0–44)
AST: 16 U/L (ref 15–41)
Albumin: 3.6 g/dL (ref 3.5–5.0)
Alkaline Phosphatase: 161 U/L — ABNORMAL HIGH (ref 38–126)
Anion gap: 8 (ref 5–15)
BUN: 24 mg/dL — ABNORMAL HIGH (ref 8–23)
CO2: 24 mmol/L (ref 22–32)
Calcium: 8.5 mg/dL — ABNORMAL LOW (ref 8.9–10.3)
Chloride: 107 mmol/L (ref 98–111)
Creatinine, Ser: 1.4 mg/dL — ABNORMAL HIGH (ref 0.44–1.00)
GFR calc Af Amer: 39 mL/min — ABNORMAL LOW (ref >60)
GFR calc non Af Amer: 34 mL/min — ABNORMAL LOW (ref >60)
Glucose, Bld: 99 mg/dL (ref 70–99)
Potassium: 4.2 mmol/L (ref 3.5–5.1)
Sodium: 139 mmol/L (ref 135–145)
Total Bilirubin: 0.5 mg/dL (ref 0.3–1.2)
Total Protein: 7 g/dL (ref 6.5–8.1)

## 2018-09-08 MED ORDER — LEUCOVORIN CALCIUM INJECTION 350 MG
700.0000 mg | Freq: Once | INTRAVENOUS | Status: AC
  Administered 2018-09-08: 700 mg via INTRAVENOUS
  Filled 2018-09-08: qty 35

## 2018-09-08 MED ORDER — DEXAMETHASONE SODIUM PHOSPHATE 100 MG/10ML IJ SOLN
Freq: Once | INTRAMUSCULAR | Status: AC
Start: 2018-09-08 — End: 2018-09-08
  Administered 2018-09-08: 10:00:00 via INTRAVENOUS
  Filled 2018-09-08: qty 4

## 2018-09-08 MED ORDER — SODIUM CHLORIDE 0.9 % IV SOLN
1920.0000 mg/m2 | INTRAVENOUS | Status: DC
  Administered 2018-09-08: 14:00:00 3100 mg via INTRAVENOUS
  Filled 2018-09-08: qty 62

## 2018-09-08 MED ORDER — SODIUM CHLORIDE 0.9 % IV SOLN
Freq: Once | INTRAVENOUS | Status: AC
  Administered 2018-09-08: 09:00:00 via INTRAVENOUS

## 2018-09-08 MED ORDER — SODIUM CHLORIDE 0.9 % IV SOLN
50.0000 mg/m2 | Freq: Once | INTRAVENOUS | Status: AC
  Administered 2018-09-08: 11:00:00 81.7 mg via INTRAVENOUS
  Filled 2018-09-08: qty 19

## 2018-09-08 NOTE — Progress Notes (Signed)
[place], [place] [address]   CLINIC:  Medical Oncology/Hematology  PCP:  Fayrene Helper, MD [address], [place] [address] [phone]   REASON FOR VISIT:  Follow-up for pancreatic cancer   BRIEF ONCOLOGIC HISTORY:    Pancreatic cancer metastasized to liver ([place])   03/10/2014 Initial Diagnosis    Pancreatic cancer metastasized to liver    03/22/2014 - 03/05/2016 Chemotherapy    Abraxane/Gemzar days 1, 8, every 28 days.  Day 15 was cancelled due to leukopenia and thrombocytopenia on day 15 cycle 1.    05/24/2014 Treatment Plan Change    Day 8 of cycle 3 is held with ANC of 1.1    06/05/2014 Imaging    CT C/A/P . Interval decrease in size of the pancreatic tail mass.Improved hepatic metastatic disease. No new lesions. No CT findings for metastatic disease involving the chest.    08/09/2014 Tumor Marker    CA 19-9= 33 (WNL)    10/26/2014 Imaging    MRI- Continued interval decrease in size of the hepatic metastatic lesions and no new lesions are identified. Continued decrease in size of the pancreatic tail lesion.    01/03/2015 Tumor Marker    Results for TUERE, [facility] (MRN [id number]) as of 01/18/2015 12:52  01/03/2015 10:00 CA 19-9: 14     01/17/2015 Imaging    MRI- Response to therapy of hepatic metastasis.  Similar size of a pancreatic tail lesion.    03/20/2015 Imaging    MRI-L spine- Severe disc space narrowing at L2-L3, with endplate reactive changes. Large disc extrusion into the ventral epidural space,central to the RIGHT with a cephalad migrated free fragment. Additional Large disc extrusion into the retroperitone...    03/22/2015 Imaging    CT pelvis- No evidence for metastatic disease within the pelvis.    07/24/2015 Imaging    MRI abd- Continued response to therapy, with no residual detectable liver metastases. No new sites of metastatic disease in the abdomen.     08/15/2015 Code Status     She confirms  desire for DNR status.    12/21/2015 Imaging    MRI liver- Severe image degradation due to motion artifact reducing diagnostic sensitivity and specificity. Reduce conspicuity of the pancreatic tail lesion suggesting further improvement. The original liver lesions have resolved.    03/05/2016 Treatment Plan Change    Chemotherapy holiday/Break after 2 years worth of treatment    04/07/2016 Imaging    CT chest- When compared to recent chest CT, new minimally displaced anterior left sixth rib fracture. Slight increase in subcarinal adenopathy    04/28/2016 Imaging    Bone density- BMD as determined from Femur Neck Right is 0.705 g/cm2 with a T-Score of -2.4. This patient is considered osteopenic according to [facility]) criteria. Compared with the prior study on 02/23/2013, the BMD of the lumbar spine/rt. femoral neck show a statistically significant decrease.     05/23/2016 Imaging    MRI abd- 1. Exam is significantly degraded by patient respiratory motion. Consider follow-up exams with CT abdomen with without contrast per pancreatic protocol. 2. Fullness in tail of pancreas is more prominent and potentially increased in size. Recommend close attention on follow-up. (Consider CT as above) 3. No explanation for back pain.    09/11/2016 Imaging    MRI pancreas- Interval progression of pancreatic tail lesion. New differential perfusion right liver with areas of heterogeneity. Underlying metastatic disease in this region not  excluded.    09/11/2016 Progression    MRi in conjunction with rising CA 19-9 are indicative of relapse of disease.    01/09/2017 Progression    CT C/A/P: 2.1 x 2.9 cm lesion in the pancreatic tail and abutting the splenic hilum, corresponding to known primary pancreatic neoplasm, mildly increased.  Scattered small hypoenhancing lesions in the liver measuring up to 10 mm, approximately 8-10 in number, suspicious for hepatic metastases.  No  findings specific for metastatic disease in the chest.  Additional ancillary findings as above.     01/27/2017 - 10/20/2017 Chemotherapy    The patient had gemcitabine (GEMZAR) 1,292 mg in sodium chloride 0.9 % 250 mL chemo infusion, 800 mg/m2 = 1,292 mg, Intravenous,  Once, 9 of 12 cycles Administration: 1,292 mg (01/28/2017), 1,292 mg (02/11/2017), 1,292 mg (02/25/2017), 1,292 mg (03/11/2017), 1,292 mg (03/25/2017), 1,292 mg (04/08/2017), 1,292 mg (04/22/2017), 1,292 mg (04/29/2017), 1,292 mg (05/20/2017), 1,292 mg (06/03/2017), 1,292 mg (06/24/2017), 1,292 mg (07/08/2017), 1,292 mg (07/29/2017), 1,292 mg (08/12/2017), 1,292 mg (08/26/2017), 1,292 mg (09/09/2017), 1,292 mg (09/23/2017), 1,292 mg (10/07/2017)  for chemotherapy treatment.     10/21/2017 -  Chemotherapy    The patient had leucovorin 700 mg in dextrose 5 % 250 mL infusion, 644 mg, Intravenous,  Once, 21 of 22 cycles Administration: 700 mg (10/21/2017), 700 mg (11/04/2017), 700 mg (11/18/2017), 700 mg (12/02/2017), 700 mg (12/16/2017), 700 mg (12/30/2017), 700 mg (01/13/2018), 700 mg (01/27/2018), 700 mg (02/10/2018), 700 mg (02/24/2018), 700 mg (03/10/2018), 700 mg (03/24/2018), 700 mg (04/28/2018), 700 mg (05/17/2018), 700 mg (05/31/2018), 700 mg (06/16/2018), 700 mg (06/30/2018), 700 mg (07/21/2018), 700 mg (08/04/2018), 700 mg (08/25/2018), 700 mg (09/08/2018) ondansetron (ZOFRAN) 8 mg in sodium chloride 0.9 % 50 mL IVPB, , Intravenous,  Once, 20 of 21 cycles Administration:  (11/04/2017),  (01/27/2018),  (02/10/2018),  (02/24/2018),  (03/10/2018),  (03/24/2018),  (04/28/2018),  (05/17/2018),  (05/31/2018),  (06/16/2018),  (06/30/2018),  (07/21/2018),  (08/04/2018),  (08/25/2018),  (09/08/2018) fluorouracil (ADRUCIL) 3,850 mg in sodium chloride 0.9 % 73 mL chemo infusion, 2,400 mg/m2 = 3,850 mg, Intravenous, 1 Day/Dose, 21 of 22 cycles Dose modification: 1,920 mg/m2 (original dose 2,400 mg/m2, Cycle 2, Reason: Provider Judgment, Comment: mucisitis) Administration: 3,850 mg  (10/21/2017), 3,100 mg (11/04/2017), 3,100 mg (11/18/2017), 3,100 mg (12/02/2017), 3,100 mg (12/16/2017), 3,100 mg (12/30/2017), 3,100 mg (01/13/2018), 3,100 mg (01/27/2018), 3,100 mg (02/10/2018), 3,100 mg (02/24/2018), 3,100 mg (03/10/2018), 3,100 mg (03/24/2018), 3,100 mg (04/28/2018), 3,100 mg (05/17/2018), 3,100 mg (05/31/2018), 3,100 mg (06/16/2018), 3,100 mg (06/30/2018), 3,100 mg (07/21/2018), 3,100 mg (08/04/2018), 3,100 mg (08/25/2018), 3,100 mg (09/08/2018) irinotecan LIPOSOME (ONIVYDE) 81.7 mg in sodium chloride 0.9 % 500 mL chemo infusion, 50 mg/m2 = 81.7 mg (100 % of original dose 50 mg/m2), Intravenous, Once, 21 of 22 cycles Dose modification: 50 mg/m2 (original dose 50 mg/m2, Cycle 1, Reason: Provider Judgment), 50 mg/m2 (original dose 50 mg/m2, Cycle 2, Reason: Provider Judgment) Administration: 81.7 mg (10/21/2017), 81.7 mg (11/04/2017), 81.7 mg (11/18/2017), 81.7 mg (12/02/2017), 81.7 mg (12/16/2017), 81.7 mg (12/30/2017), 81.7 mg (01/13/2018), 81.7 mg (01/27/2018), 81.7 mg (02/10/2018), 81.7 mg (02/24/2018), 81.7 mg (03/10/2018), 81.7 mg (03/24/2018), 81.7 mg (04/28/2018), 81.7 mg (05/17/2018), 81.7 mg (05/31/2018), 81.7 mg (06/16/2018), 81.7 mg (06/30/2018), 81.7 mg (07/21/2018), 81.7 mg (08/04/2018), 81.7 mg (08/25/2018), 81.7 mg (09/08/2018)  for chemotherapy treatment.       CANCER STAGING: Cancer Staging Pancreatic cancer metastasized to liver [facility]) Staging form: Pancreas, AJCC 7th Edition - Clinical: Stage IV (T3, N1, M1) - Signed by Sheldon Silvan,  Manon Hilding, PA-C on 03/16/2014 - Pathologic: No stage assigned - Unsigned    INTERVAL HISTORY:  Ms. Meloy 85 y.o. female returns for routine follow-up and consideration for next cycle of chemotherapy. She is here today alone. She states that she has had trouble sleeping over the last few days. Denies any nausea, vomiting, or diarrhea. Denies any new pains. Had not noticed any recent bleeding such as epistaxis, hematuria or hematochezia. Denies recent chest pain on exertion,  shortness of breath on minimal exertion, pre-syncopal episodes, or palpitations. Denies any numbness or tingling in hands or feet. Denies any recent fevers, infections, or recent hospitalizations. Patient reports appetite at 75% and energy level at 75%.    REVIEW OF SYSTEMS:  Review of Systems  Psychiatric/Behavioral: Positive for sleep disturbance.     PAST MEDICAL/SURGICAL HISTORY:  Past Medical History:  Diagnosis Date  . Anemia due to antineoplastic chemotherapy 09/12/2015   Started Aranesp 500 mcg on 09/12/2015  . Anxiety   . Chronic diarrhea   . Depression   . DNR (do not resuscitate) 08/16/2015  . Erosive esophagitis   . GERD (gastroesophageal reflux disease)   . Hx of adenomatous colonic polyps    tubular adenomas, last found in 2008  . Hyperplastic colon polyp 03/19/10   tcs by Dr. Gala Romney  . Hypertension 20 years   . Kidney stone    hx/ crushed   . Opioid contract exists 04/18/2015   With Dr. Moshe Cipro  . Pancreatic cancer ([place]) 02/2014  . Pancreatic cancer metastasized to liver ([place]) 03/10/2014  . Schatzki's ring 08/26/10   Last dilated on EGD by Dr. Trevor Iha [facility], linear gastric erosions, BI hemigastrectomy   Past Surgical History:  Procedure Laterality Date  . BREAST LUMPECTOMY Left   . bunion removal     from both feet   . CHOLECYSTECTOMY  1965   . COLONOSCOPY  03/19/2010   DR Gala Romney,, normal TI, pancolonic diverticula, random colon bx neg., hyperplastic polyps removed  . ESOPHAGOGASTRODUODENOSCOPY  11/22/2003   DR Gala Romney, erosive RE, Billroth I  . ESOPHAGOGASTRODUODENOSCOPY  08/26/10   Dr. Gala Romney- moderate severe ERE, Scahtzki ring s/p dilation, Billroth I, linear gastric erosions, bx-gastric xanthelasma  . LEFT SHOULDER SURGERY  2009   DR HARRISON  . ORIF ANKLE FRACTURE Right 05/22/2013   Procedure: OPEN REDUCTION INTERNAL FIXATION (ORIF) RIGHT ANKLE FRACTURE;  Surgeon: Sanjuana Kava, MD;  Location: [facility];  Service: Orthopedics;  Laterality: Right;  . stomach ulcer   50 years ago    had some of her stomach removed      SOCIAL HISTORY:  Social History   Socioeconomic History  . Marital status: Widowed    Spouse name: Not on file  . Number of children: 2  . Years of education: 37  . Highest education level: 12th grade  Occupational History  . Occupation: retired from Estate manager/land agent: RETIRED  Social Needs  . Financial resource strain: Not hard at all  . Food insecurity:    Worry: Never true    Inability: Never true  . Transportation needs:    Medical: No    Non-medical: No  Tobacco Use  . Smoking status: Current Some Day Smoker    Packs/day: 0.50    Years: 60.00    Pack years: 30.00    Types: Cigarettes    Last attempt to quit: 09/09/2016    Years since quitting: 1.9  . Smokeless tobacco: Never Used  Substance and Sexual Activity  . Alcohol  use: No  . Drug use: No  . Sexual activity: Not Currently  Lifestyle  . Physical activity:    Days per week: 3 days    Minutes per session: 30 min  . Stress: Not at all  Relationships  . Social connections:    Talks on phone: More than three times a week    Gets together: Three times a week    Attends religious service: 1 to 4 times per year    Active member of club or organization: No    Attends meetings of clubs or organizations: Never    Relationship status: Widowed  . Intimate partner violence:    Fear of current or ex partner: No    Emotionally abused: No    Physically abused: No    Forced sexual activity: No  Other Topics Concern  . Not on file  Social History Narrative   Lives with son alone     FAMILY HISTORY:  Family History  Problem Relation Age of Onset  . Hypertension Mother   . Kidney failure Brother        on dialysis  . Diabetes Sister   . Diabetes Sister   . Hypertension Father   . Kidney failure Sister   . Kidney Stones Brother   . Breast cancer Son   . Gout Son   . Liver disease Neg Hx   . Colon cancer Neg Hx     CURRENT MEDICATIONS:  Outpatient  Encounter Medications as of 09/08/2018  Medication Sig Note  . acetaminophen (TYLENOL) 500 MG tablet Take 500 mg by mouth every 6 (six) hours as needed for mild pain or moderate pain.    Marland Kitchen amLODipine (NORVASC) 10 MG tablet Take 1 tablet (10 mg total) by mouth daily.   . clonazePAM (KLONOPIN) 0.5 MG tablet Take one tablet daily for anxiety   . cycloSPORINE (RESTASIS) 0.05 % ophthalmic emulsion Place 1 drop into both eyes 2 (two) times daily.   . diphenhydrAMINE (BENADRYL) 50 MG tablet Take 1 tablet 1 hour prior to CT 07/21/2018: Currently taking around the clock due to itching  . fluorometholone (FML) 0.1 % ophthalmic suspension INSTILL 1 DROP INTO EACH EYE THREE TIMES DAILY FOR 14 DAYS   . lidocaine-prilocaine (EMLA) cream Apply 1 application topically as needed. Apply to portacath site as needed   . metoprolol tartrate (LOPRESSOR) 25 MG tablet Take one tablet once daily for blood pressure   . mirabegron ER (MYRBETRIQ) 50 MG TB24 tablet Take 1 tablet (50 mg total) by mouth daily.   . pantoprazole (PROTONIX) 20 MG tablet Take 1 tablet by mouth daily   . predniSONE (STERAPRED UNI-PAK 21 TAB) 5 MG (21) TBPK tablet Use as directed on package 07/21/2018: Taking for itching on her neck and head  . traMADol (ULTRAM) 50 MG tablet Take one tablet once daily, as needed, for uncontrolled arthritic pain   . [DISCONTINUED] prochlorperazine (COMPAZINE) 10 MG tablet Take 1 tablet (10 mg total) by mouth every 6 (six) hours as needed (Nausea or vomiting).    Facility-Administered Encounter Medications as of 09/08/2018  Medication  . sodium chloride flush (NS) 0.9 % injection 10 mL    ALLERGIES:  Allergies  Allergen Reactions  . Iohexol Hives and Shortness Of Breath    **Patient can not have IV contrast, pt has breakthrough reaction even after 13 hour premeds** Do not give IV contrast PATIENT HAD TO BE TAKEN TO ED, C/O WHELPS, HIVES, DIFFICULTY BREATHING. PATIENT GIVEN IV CONTRAST AFTER  [place] ON  01/09/2017 WITH NO REACTION NOTED. On 10/19/17 patient had reaction despite premedication.    Marland Kitchen Aciphex [Rabeprazole Sodium] Other (See Comments)    unknown  . Amlodipine Besylate-Valsartan     Rash to high-dose (5/320)  . Esomeprazole Magnesium   . Omeprazole Other (See Comments)    Patient states "medication didn't work"  . Ciprofloxacin Rash  . Penicillins Swelling and Rash    Has patient had a PCN reaction causing immediate rash, facial/tongue/throat swelling, SOB or lightheadedness with hypotension: Yes Has patient had a PCN reaction causing severe rash involving mucus membranes or skin necrosis: Yes Has patient had a PCN reaction that required hospitalization: No Has patient had a PCN reaction occurring within the last 10 years: yes If all of the above answers are "NO", then may proceed with Cephalosporin use.   [name] [Benzonatate] Itching    Full head, face, neck, and chest itching.      PHYSICAL EXAM:  ECOG Performance status: 1 Blood pressure is 140/57.  Pulse rate is 53.  Respiratory 16.  Temperature 98.  Oxygen saturation is 100. Physical Exam Vitals signs reviewed.  Constitutional:      Appearance: Normal appearance.  Cardiovascular:     Rate and Rhythm: Normal rate and regular rhythm.     Heart sounds: Normal heart sounds.  Pulmonary:     Effort: Pulmonary effort is normal.     Breath sounds: Normal breath sounds.  Abdominal:     General: Bowel sounds are normal. There is no distension.     Palpations: Abdomen is soft.  Musculoskeletal:        General: No swelling.  Skin:    General: Skin is warm.  Neurological:     General: No focal deficit present.     Mental Status: She is alert and oriented to person, place, and time.  Psychiatric:        Mood and Affect: Mood normal.        Behavior: Behavior normal.      LABORATORY DATA:  I have reviewed the labs as listed.  CBC    Component Value Date/Time   WBC 5.3 09/08/2018 0753   RBC 3.33  (L) 09/08/2018 0753   HGB 10.1 (L) 09/08/2018 0753   HCT 31.3 (L) 09/08/2018 0753   PLT 124 (L) 09/08/2018 0753   MCV 94.0 09/08/2018 0753   MCH 30.3 09/08/2018 0753   MCHC 32.3 09/08/2018 0753   RDW 16.8 (H) 09/08/2018 0753   LYMPHSABS 0.9 09/08/2018 0753   MONOABS 0.4 09/08/2018 0753   EOSABS 0.3 09/08/2018 0753   BASOSABS 0.0 09/08/2018 0753   CMP Latest Ref Rng & Units 09/08/2018 08/25/2018 08/04/2018  Glucose 70 - 99 mg/dL 99 118(H) 133(H)  BUN 8 - 23 mg/dL 24(H) 35(H) 27(H)  Creatinine 0.44 - 1.00 mg/dL 1.40(H) 1.46(H) 1.34(H)  Sodium 135 - 145 mmol/L 139 139 140  Potassium 3.5 - 5.1 mmol/L 4.2 4.4 3.9  Chloride 98 - 111 mmol/L 107 107 109  CO2 22 - 32 mmol/L '24 24 25  ' Calcium 8.9 - 10.3 mg/dL 8.5(L) 8.4(L) 8.5(L)  Total Protein 6.5 - 8.1 g/dL 7.0 7.1 7.0  Total Bilirubin 0.3 - 1.2 mg/dL 0.5 0.6 0.7  Alkaline Phos 38 - 126 U/L 161(H) 163(H) 147(H)  AST 15 - 41 U/L '16 19 18  ' ALT 0 - 44 U/L '12 14 13       ' DIAGNOSTIC IMAGING:  I have independently reviewed the  scans and discussed with the patient.   I have reviewed Venita Lick LPN's note and agree with the documentation.  I personally performed a face-to-face visit, made revisions and my assessment and plan is as follows.    ASSESSMENT & PLAN:   Pancreatic cancer metastasized to liver ([place]) 1.  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 28 days from 01/28/2017 until 10/07/2017 with progression. - CT CAP on 10/19/2017 showed progression of liver lesions which are very subtle.  Progression in the tail of the pancreas lesion measuring 27 x 27 mm.  Her tumor markers were also going up.  Hence a treatment change was recommended.  - Cycle 1 of infusional 5-FU and Onivyde (50 mg/m) started  on 10/21/2017, dose of infusional 5-FU decreased by 20% after cycle 1 secondary to mucositis.  - CT CAP on 06/28/2018 shows stable appearance of focal thickening in the tail of the pancreas extending into the splenic hilum.  Liver metastasis are similar with no progression.  No new liver lesions. - She had rash over the scalp and neck region and was diagnosed as seborrheic dermatitis by her allergist.  She is applying fluticasone cream twice daily.  -Her latest CA 19-9 has elevated to 171. -She did not experience any major side effects from the last chemotherapy.  I will plan to proceed with her next cycle today.  I will arrange for CT scan prior to next visit in 2 weeks.   2.  Weight loss: -Her weight has been stable.  She will continue Ensure 3 cans/day. -She is continue Marinol.  3.  Genetic testing: -We will test her for BRCA1/2 and MSI.    Total time spent is 25 minutes with more than 50% of the time spent face-to-face discussing and reinforcing chemotherapy regimen, side effects and coordination of care.     Orders placed this encounter:  Orders Placed This Encounter  Procedures  . CT Abdomen Pelvis W Contrast  . CT Chest W Contrast  . CBC with Differential/Platelet  . Comprehensive metabolic panel      Derek Jack, MD [place] [phone]

## 2018-09-08 NOTE — Patient Instructions (Addendum)
[place] at [facility] Discharge Instructions  You were seen today by Dr. Delton Coombes. He went over your recent results. He will see you back in 2 weeks  for labs and follow up. TRY TAKING MELATONIN over the counter to help with SLEEP.   Thank you for choosing [place] at [facility] to provide your oncology and hematology care.  To afford each patient quality time with our provider, please arrive at least 15 minutes before your scheduled appointment time.   If you have a lab appointment with the [place] please come in thru the  Main Entrance and check in at the main information desk  You need to re-schedule your appointment should you arrive 10 or more minutes late.  We strive to give you quality time with our providers, and arriving late affects you and other patients whose appointments are after yours.  Also, if you no show three or more times for appointments you may be dismissed from the clinic at the providers discretion.     Again, thank you for choosing [facility].  Our hope is that these requests will decrease the amount of time that you wait before being seen by our physicians.       _____________________________________________________________  Should you have questions after your visit to [facility], please contact our office at [phone] between the hours of 8:00 a.m. and 4:30 p.m.  Voicemails left after 4:00 p.m. will not be returned until the following business day.  For prescription refill requests, have your pharmacy contact our office and allow 72 hours.    Cancer Center Support Programs:   > Cancer Support Group  2nd Tuesday of the month 1pm-2pm, Journey Room

## 2018-09-08 NOTE — Patient Instructions (Signed)
[facility] Discharge Instructions for Patients Receiving Chemotherapy  Today you received the following chemotherapy agents   To help prevent nausea and vomiting after your treatment, we encourage you to take your nausea medication   If you develop nausea and vomiting that is not controlled by your nausea medication, call the clinic.   BELOW ARE SYMPTOMS THAT SHOULD BE REPORTED IMMEDIATELY:  *FEVER GREATER THAN 100.5 F  *CHILLS WITH OR WITHOUT FEVER  NAUSEA AND VOMITING THAT IS NOT CONTROLLED WITH YOUR NAUSEA MEDICATION  *UNUSUAL SHORTNESS OF BREATH  *UNUSUAL BRUISING OR BLEEDING  TENDERNESS IN MOUTH AND THROAT WITH OR WITHOUT PRESENCE OF ULCERS  *URINARY PROBLEMS  *BOWEL PROBLEMS  UNUSUAL RASH Items with * indicate a potential emergency and should be followed up as soon as possible.  Feel free to call the clinic should you have any questions or concerns. The clinic phone number is [phone].  Please show the CHEMO ALERT CARD at check-in to the Emergency Department and triage nurse.   

## 2018-09-08 NOTE — Progress Notes (Signed)
Treatment given today per MD orders. Tolerated infusion without adverse affects. Vital signs stable. No complaints at this time. 5FU pump infusing per protocol.  Discharged from clinic ambulatory. F/U with [facility] as scheduled.

## 2018-09-08 NOTE — Assessment & Plan Note (Addendum)
1.  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 28 days from 01/28/2017 until 10/07/2017 with progression. - CT CAP on 10/19/2017 showed progression of liver lesions which are very subtle.  Progression in the tail of the pancreas lesion measuring 27 x 27 mm.  Her tumor markers were also going up.  Hence a treatment change was recommended.  - Cycle 1 of infusional 5-FU and Onivyde (50 mg/m) started on 10/21/2017, dose of infusional 5-FU decreased by 20% after cycle 1 secondary to mucositis.  - CT CAP on 06/28/2018 shows stable appearance of focal thickening in the tail of the pancreas extending into the splenic hilum.  Liver metastasis are similar with no progression.  No new liver lesions. - She had rash over the scalp and neck region and was diagnosed as seborrheic dermatitis by her allergist.  She is applying fluticasone cream twice daily.  -Her latest CA 19-9 has elevated to 171. -She did not experience any major side effects from the last chemotherapy.  I will plan to proceed with her next cycle today.  I will arrange for CT scan prior to next visit in 2 weeks.   2.  Weight loss: -Her weight has been stable.  She will continue Ensure 3 cans/day. -She is continue Marinol.  3.  Genetic testing: -We will test her for BRCA1/2 and MSI.

## 2018-09-10 ENCOUNTER — Other Ambulatory Visit (HOSPITAL_COMMUNITY): Payer: Self-pay | Admitting: Nurse Practitioner

## 2018-09-10 ENCOUNTER — Other Ambulatory Visit: Payer: Self-pay

## 2018-09-10 ENCOUNTER — Inpatient Hospital Stay (HOSPITAL_COMMUNITY): Payer: Medicare HMO | Attending: Hematology

## 2018-09-10 ENCOUNTER — Encounter (HOSPITAL_COMMUNITY): Payer: Self-pay

## 2018-09-10 VITALS — BP 143/45 | HR 66 | Temp 97.7°F | Resp 16

## 2018-09-10 DIAGNOSIS — C259 Malignant neoplasm of pancreas, unspecified: Secondary | ICD-10-CM

## 2018-09-10 DIAGNOSIS — C787 Secondary malignant neoplasm of liver and intrahepatic bile duct: Secondary | ICD-10-CM | POA: Diagnosis not present

## 2018-09-10 DIAGNOSIS — C252 Malignant neoplasm of tail of pancreas: Secondary | ICD-10-CM | POA: Insufficient documentation

## 2018-09-10 DIAGNOSIS — Z91041 Radiographic dye allergy status: Secondary | ICD-10-CM

## 2018-09-10 DIAGNOSIS — Z5111 Encounter for antineoplastic chemotherapy: Secondary | ICD-10-CM | POA: Insufficient documentation

## 2018-09-10 MED ORDER — SODIUM CHLORIDE 0.9% FLUSH
10.0000 mL | INTRAVENOUS | Status: DC | PRN
  Administered 2018-09-10: 10 mL
  Filled 2018-09-10: qty 10

## 2018-09-10 MED ORDER — DIPHENHYDRAMINE HCL 50 MG PO TABS: ORAL_TABLET | ORAL | 0 refills | Status: DC

## 2018-09-10 MED ORDER — HEPARIN SOD (PORK) LOCK FLUSH 100 UNIT/ML IV SOLN
500.0000 [IU] | Freq: Once | INTRAVENOUS | Status: AC | PRN
  Administered 2018-09-10: 500 [IU]

## 2018-09-10 MED ORDER — PREDNISONE 50 MG PO TABS: ORAL_TABLET | ORAL | 0 refills | Status: DC

## 2018-09-10 NOTE — Progress Notes (Signed)
[name] returns today for port de access and flush after 46 hr continous infusion of 72fu. Tolerated infusion without problems. Portacath located right chest wall was  deaccessed and flushed with 103ml NS and 500U/68ml Heparin and needle removed intact.  Procedure without incident. Patient tolerated procedure well.

## 2018-09-16 ENCOUNTER — Encounter: Payer: Self-pay | Admitting: *Deleted

## 2018-09-20 ENCOUNTER — Other Ambulatory Visit: Payer: Self-pay

## 2018-09-20 ENCOUNTER — Ambulatory Visit (HOSPITAL_COMMUNITY)
Admission: RE | Admit: 2018-09-20 | Discharge: 2018-09-20 | Disposition: A | Discharge status: Home / Self Care | Payer: Medicare HMO | Source: Ambulatory Visit | Attending: Hematology | Admitting: Hematology

## 2018-09-20 DIAGNOSIS — C787 Secondary malignant neoplasm of liver and intrahepatic bile duct: Secondary | ICD-10-CM | POA: Insufficient documentation

## 2018-09-20 DIAGNOSIS — C259 Malignant neoplasm of pancreas, unspecified: Secondary | ICD-10-CM | POA: Diagnosis not present

## 2018-09-22 ENCOUNTER — Inpatient Hospital Stay (HOSPITAL_COMMUNITY): Payer: Medicare HMO

## 2018-09-22 ENCOUNTER — Inpatient Hospital Stay (HOSPITAL_BASED_OUTPATIENT_CLINIC_OR_DEPARTMENT_OTHER): Payer: Medicare HMO | Admitting: Hematology

## 2018-09-22 ENCOUNTER — Encounter (HOSPITAL_COMMUNITY): Payer: Self-pay

## 2018-09-22 ENCOUNTER — Other Ambulatory Visit: Payer: Self-pay

## 2018-09-22 ENCOUNTER — Encounter (HOSPITAL_COMMUNITY): Payer: Self-pay | Admitting: Hematology

## 2018-09-22 VITALS — BP 130/93 | HR 56 | Temp 97.7°F | Resp 18

## 2018-09-22 DIAGNOSIS — Z7189 Other specified counseling: Secondary | ICD-10-CM | POA: Insufficient documentation

## 2018-09-22 DIAGNOSIS — R634 Abnormal weight loss: Secondary | ICD-10-CM

## 2018-09-22 DIAGNOSIS — C259 Malignant neoplasm of pancreas, unspecified: Secondary | ICD-10-CM

## 2018-09-22 DIAGNOSIS — C252 Malignant neoplasm of tail of pancreas: Secondary | ICD-10-CM

## 2018-09-22 DIAGNOSIS — Z79899 Other long term (current) drug therapy: Secondary | ICD-10-CM

## 2018-09-22 DIAGNOSIS — C787 Secondary malignant neoplasm of liver and intrahepatic bile duct: Secondary | ICD-10-CM | POA: Diagnosis not present

## 2018-09-22 DIAGNOSIS — R69 Illness, unspecified: Secondary | ICD-10-CM | POA: Diagnosis not present

## 2018-09-22 DIAGNOSIS — Z5111 Encounter for antineoplastic chemotherapy: Secondary | ICD-10-CM | POA: Diagnosis not present

## 2018-09-22 DIAGNOSIS — Z803 Family history of malignant neoplasm of breast: Secondary | ICD-10-CM

## 2018-09-22 DIAGNOSIS — R202 Paresthesia of skin: Secondary | ICD-10-CM

## 2018-09-22 DIAGNOSIS — F17210 Nicotine dependence, cigarettes, uncomplicated: Secondary | ICD-10-CM

## 2018-09-22 LAB — CBC WITH DIFFERENTIAL/PLATELET
Abs Immature Granulocytes: 0.01 10*3/uL (ref 0.00–0.07)
Basophils Absolute: 0 10*3/uL (ref 0.0–0.1)
Basophils Relative: 0 %
Eosinophils Absolute: 0.2 10*3/uL (ref 0.0–0.5)
Eosinophils Relative: 4 %
HCT: 31.5 % — ABNORMAL LOW (ref 36.0–46.0)
Hemoglobin: 10.3 g/dL — ABNORMAL LOW (ref 12.0–15.0)
Immature Granulocytes: 0 %
Lymphocytes Relative: 15 %
Lymphs Abs: 0.8 10*3/uL (ref 0.7–4.0)
MCH: 30.2 pg (ref 26.0–34.0)
MCHC: 32.7 g/dL (ref 30.0–36.0)
MCV: 92.4 fL (ref 80.0–100.0)
Monocytes Absolute: 0.4 10*3/uL (ref 0.1–1.0)
Monocytes Relative: 7 %
Neutro Abs: 3.7 10*3/uL (ref 1.7–7.7)
Neutrophils Relative %: 74 %
Platelets: 127 10*3/uL — ABNORMAL LOW (ref 150–400)
RBC: 3.41 MIL/uL — ABNORMAL LOW (ref 3.87–5.11)
RDW: 16.4 % — ABNORMAL HIGH (ref 11.5–15.5)
WBC: 5 10*3/uL (ref 4.0–10.5)
nRBC: 0 % (ref 0.0–0.2)

## 2018-09-22 LAB — COMPREHENSIVE METABOLIC PANEL
ALT: 10 U/L (ref 0–44)
AST: 13 U/L — ABNORMAL LOW (ref 15–41)
Albumin: 3.6 g/dL (ref 3.5–5.0)
Alkaline Phosphatase: 137 U/L — ABNORMAL HIGH (ref 38–126)
Anion gap: 10 (ref 5–15)
BUN: 23 mg/dL (ref 8–23)
CO2: 26 mmol/L (ref 22–32)
Calcium: 8.4 mg/dL — ABNORMAL LOW (ref 8.9–10.3)
Chloride: 107 mmol/L (ref 98–111)
Creatinine, Ser: 1.29 mg/dL — ABNORMAL HIGH (ref 0.44–1.00)
GFR calc Af Amer: 43 mL/min — ABNORMAL LOW (ref >60)
GFR calc non Af Amer: 37 mL/min — ABNORMAL LOW (ref >60)
Glucose, Bld: 91 mg/dL (ref 70–99)
Potassium: 4.1 mmol/L (ref 3.5–5.1)
Sodium: 143 mmol/L (ref 135–145)
Total Bilirubin: 0.7 mg/dL (ref 0.3–1.2)
Total Protein: 6.9 g/dL (ref 6.5–8.1)

## 2018-09-22 MED ORDER — SODIUM CHLORIDE 0.9 % IV SOLN
10.0000 mg | Freq: Once | INTRAVENOUS | Status: AC
  Administered 2018-09-22: 10 mg via INTRAVENOUS
  Filled 2018-09-22: qty 10

## 2018-09-22 MED ORDER — SODIUM CHLORIDE 0.9 % IV SOLN
Freq: Once | INTRAVENOUS | Status: AC
  Administered 2018-09-22: 10:00:00 via INTRAVENOUS

## 2018-09-22 MED ORDER — DEXTROSE 5 % IV SOLN
Freq: Once | INTRAVENOUS | Status: AC
  Administered 2018-09-22: 11:00:00 via INTRAVENOUS

## 2018-09-22 MED ORDER — SODIUM CHLORIDE 0.9 % IV SOLN
Freq: Once | INTRAVENOUS | Status: DC
  Filled 2018-09-22: qty 4

## 2018-09-22 MED ORDER — SODIUM CHLORIDE 0.9 % IV SOLN
1920.0000 mg/m2 | INTRAVENOUS | Status: AC
  Administered 2018-09-22: 3000 mg via INTRAVENOUS
  Filled 2018-09-22: qty 50

## 2018-09-22 MED ORDER — PALONOSETRON HCL INJECTION 0.25 MG/5ML
0.2500 mg | Freq: Once | INTRAVENOUS | Status: AC
  Administered 2018-09-22: 0.25 mg via INTRAVENOUS
  Filled 2018-09-22: qty 5

## 2018-09-22 MED ORDER — OXALIPLATIN CHEMO INJECTION 100 MG/20ML
65.0000 mg/m2 | Freq: Once | INTRAVENOUS | Status: AC
  Administered 2018-09-22: 100 mg via INTRAVENOUS
  Filled 2018-09-22: qty 20

## 2018-09-22 MED ORDER — FLUOROURACIL CHEMO INJECTION 500 MG/10ML
320.0000 mg/m2 | Freq: Once | INTRAVENOUS | Status: AC
  Administered 2018-09-22: 500 mg via INTRAVENOUS
  Filled 2018-09-22: qty 10

## 2018-09-22 MED ORDER — LEUCOVORIN CALCIUM INJECTION 350 MG
400.0000 mg/m2 | Freq: Once | INTRAVENOUS | Status: AC
  Administered 2018-09-22: 12:00:00 628 mg via INTRAVENOUS
  Filled 2018-09-22: qty 31.4

## 2018-09-22 NOTE — Addendum Note (Signed)
Addended by: Benjiman Core D on: 09/22/2018 10:01 AM   Modules accepted: Orders

## 2018-09-22 NOTE — Progress Notes (Signed)
Pt presents today for port flush and lab draw. Labs sent.

## 2018-09-22 NOTE — Progress Notes (Signed)
[place], [place] [address]   CLINIC:  Medical Oncology/Hematology  PCP:  Fayrene Helper, MD [address] [place] [address] [phone]   REASON FOR VISIT:  Follow-up for metastatic pancreatic cancer   BRIEF ONCOLOGIC HISTORY:    Pancreatic cancer metastasized to liver ([place])   03/10/2014 Initial Diagnosis    Pancreatic cancer metastasized to liver    03/22/2014 - 03/05/2016 Chemotherapy    Abraxane/Gemzar days 1, 8, every 28 days.  Day 15 was cancelled due to leukopenia and thrombocytopenia on day 15 cycle 1.    05/24/2014 Treatment Plan Change    Day 8 of cycle 3 is held with ANC of 1.1    06/05/2014 Imaging    CT C/A/P . Interval decrease in size of the pancreatic tail mass.Improved hepatic metastatic disease. No new lesions. No CT findings for metastatic disease involving the chest.    08/09/2014 Tumor Marker    CA 19-9= 33 (WNL)    10/26/2014 Imaging    MRI- Continued interval decrease in size of the hepatic metastatic lesions and no new lesions are identified. Continued decrease in size of the pancreatic tail lesion.    01/03/2015 Tumor Marker    Results for [name] (MRN [id number]) as of 01/18/2015 12:52  01/03/2015 10:00 CA 19-9: 14     01/17/2015 Imaging    MRI- Response to therapy of hepatic metastasis.  Similar size of a pancreatic tail lesion.    03/20/2015 Imaging    MRI-L spine- Severe disc space narrowing at L2-L3, with endplate reactive changes. Large disc extrusion into the ventral epidural space,central to the RIGHT with a cephalad migrated free fragment. Additional Large disc extrusion into the retroperitone...    03/22/2015 Imaging    CT pelvis- No evidence for metastatic disease within the pelvis.    07/24/2015 Imaging    MRI abd- Continued response to therapy, with no residual detectable liver metastases. No new sites of metastatic disease in the abdomen.     08/15/2015 Code Status   She confirms desire for DNR status.    12/21/2015 Imaging    MRI liver- Severe image degradation due to motion artifact reducing diagnostic sensitivity and specificity. Reduce conspicuity of the pancreatic tail lesion suggesting further improvement. The original liver lesions have resolved.    03/05/2016 Treatment Plan Change    Chemotherapy holiday/Break after 2 years worth of treatment    04/07/2016 Imaging    CT chest- When compared to recent chest CT, new minimally displaced anterior left sixth rib fracture. Slight increase in subcarinal adenopathy    04/28/2016 Imaging    Bone density- BMD as determined from Femur Neck Right is 0.705 g/cm2 with a T-Score of -2.4. This patient is considered osteopenic according to [facility]) criteria. Compared with the prior study on 02/23/2013, the BMD of the lumbar spine/rt. femoral neck show a statistically significant decrease.     05/23/2016 Imaging    MRI abd- 1. Exam is significantly degraded by patient respiratory motion. Consider follow-up exams with CT abdomen with without contrast per pancreatic protocol. 2. Fullness in tail of pancreas is more prominent and potentially increased in size. Recommend close attention on follow-up. (Consider CT as above) 3. No explanation for back pain.    09/11/2016 Imaging    MRI pancreas- Interval progression of pancreatic tail lesion. New differential perfusion right liver with areas of heterogeneity. Underlying metastatic disease in this region not excluded.  09/11/2016 Progression    MRi in conjunction with rising CA 19-9 are indicative of relapse of disease.    01/09/2017 Progression    CT C/A/P: 2.1 x 2.9 cm lesion in the pancreatic tail and abutting the splenic hilum, corresponding to known primary pancreatic neoplasm, mildly increased.  Scattered small hypoenhancing lesions in the liver measuring up to 10 mm, approximately 8-10 in number, suspicious for hepatic  metastases.  No findings specific for metastatic disease in the chest.  Additional ancillary findings as above.     01/27/2017 - 10/20/2017 Chemotherapy    The patient had gemcitabine (GEMZAR) 1,292 mg in sodium chloride 0.9 % 250 mL chemo infusion, 800 mg/m2 = 1,292 mg, Intravenous,  Once, 9 of 12 cycles Administration: 1,292 mg (01/28/2017), 1,292 mg (02/11/2017), 1,292 mg (02/25/2017), 1,292 mg (03/11/2017), 1,292 mg (03/25/2017), 1,292 mg (04/08/2017), 1,292 mg (04/22/2017), 1,292 mg (04/29/2017), 1,292 mg (05/20/2017), 1,292 mg (06/03/2017), 1,292 mg (06/24/2017), 1,292 mg (07/08/2017), 1,292 mg (07/29/2017), 1,292 mg (08/12/2017), 1,292 mg (08/26/2017), 1,292 mg (09/09/2017), 1,292 mg (09/23/2017), 1,292 mg (10/07/2017)  for chemotherapy treatment.     10/21/2017 - 09/22/2018 Chemotherapy    The patient had leucovorin 700 mg in dextrose 5 % 250 mL infusion, 644 mg, Intravenous,  Once, 22 of 22 cycles Administration: 700 mg (10/21/2017), 700 mg (11/04/2017), 700 mg (11/18/2017), 700 mg (12/02/2017), 700 mg (12/16/2017), 700 mg (12/30/2017), 700 mg (01/13/2018), 700 mg (01/27/2018), 700 mg (02/10/2018), 700 mg (02/24/2018), 700 mg (03/10/2018), 700 mg (03/24/2018), 700 mg (04/28/2018), 700 mg (05/17/2018), 700 mg (05/31/2018), 700 mg (06/16/2018), 700 mg (06/30/2018), 700 mg (07/21/2018), 700 mg (08/04/2018), 700 mg (08/25/2018), 700 mg (09/08/2018) ondansetron (ZOFRAN) 8 mg in sodium chloride 0.9 % 50 mL IVPB, , Intravenous,  Once, 21 of 21 cycles Administration:  (11/04/2017),  (01/27/2018),  (02/10/2018),  (02/24/2018),  (03/10/2018),  (03/24/2018),  (04/28/2018),  (05/17/2018),  (05/31/2018),  (06/16/2018),  (06/30/2018),  (07/21/2018),  (08/04/2018),  (08/25/2018),  (09/08/2018) fluorouracil (ADRUCIL) 3,850 mg in sodium chloride 0.9 % 73 mL chemo infusion, 2,400 mg/m2 = 3,850 mg, Intravenous, 1 Day/Dose, 22 of 22 cycles Dose modification: 1,920 mg/m2 (original dose 2,400 mg/m2, Cycle 2, Reason: Provider Judgment, Comment: mucisitis)  Administration: 3,850 mg (10/21/2017), 3,100 mg (11/04/2017), 3,100 mg (11/18/2017), 3,100 mg (12/02/2017), 3,100 mg (12/16/2017), 3,100 mg (12/30/2017), 3,100 mg (01/13/2018), 3,100 mg (01/27/2018), 3,100 mg (02/10/2018), 3,100 mg (02/24/2018), 3,100 mg (03/10/2018), 3,100 mg (03/24/2018), 3,100 mg (04/28/2018), 3,100 mg (05/17/2018), 3,100 mg (05/31/2018), 3,100 mg (06/16/2018), 3,100 mg (06/30/2018), 3,100 mg (07/21/2018), 3,100 mg (08/04/2018), 3,100 mg (08/25/2018), 3,100 mg (09/08/2018) irinotecan LIPOSOME (ONIVYDE) 81.7 mg in sodium chloride 0.9 % 500 mL chemo infusion, 50 mg/m2 = 81.7 mg (100 % of original dose 50 mg/m2), Intravenous, Once, 22 of 22 cycles Dose modification: 50 mg/m2 (original dose 50 mg/m2, Cycle 1, Reason: Provider Judgment), 50 mg/m2 (original dose 50 mg/m2, Cycle 2, Reason: Provider Judgment) Administration: 81.7 mg (10/21/2017), 81.7 mg (11/04/2017), 81.7 mg (11/18/2017), 81.7 mg (12/02/2017), 81.7 mg (12/16/2017), 81.7 mg (12/30/2017), 81.7 mg (01/13/2018), 81.7 mg (01/27/2018), 81.7 mg (02/10/2018), 81.7 mg (02/24/2018), 81.7 mg (03/10/2018), 81.7 mg (03/24/2018), 81.7 mg (04/28/2018), 81.7 mg (05/17/2018), 81.7 mg (05/31/2018), 81.7 mg (06/16/2018), 81.7 mg (06/30/2018), 81.7 mg (07/21/2018), 81.7 mg (08/04/2018), 81.7 mg (08/25/2018), 81.7 mg (09/08/2018)  for chemotherapy treatment.     09/22/2018 -  Chemotherapy    The patient had palonosetron (ALOXI) injection 0.25 mg, 0.25 mg, Intravenous,  Once, 1 of 4 cycles leucovorin 628 mg in dextrose 5 % 250 mL  infusion, 400 mg/m2 = 628 mg, Intravenous,  Once, 1 of 4 cycles oxaliplatin (ELOXATIN) 100 mg in dextrose 5 % 500 mL chemo infusion, 65 mg/m2 = 100 mg (100 % of original dose 65 mg/m2), Intravenous,  Once, 1 of 4 cycles Dose modification: 65 mg/m2 (original dose 65 mg/m2, Cycle 1, Reason: Provider Judgment) fluorouracil (ADRUCIL) chemo injection 500 mg, 320 mg/m2 = 500 mg (100 % of original dose 320 mg/m2), Intravenous,  Once, 1 of 4 cycles Dose modification:  320 mg/m2 (original dose 320 mg/m2, Cycle 1, Reason: Provider Judgment) fluorouracil (ADRUCIL) 3,000 mg in sodium chloride 0.9 % 90 mL chemo infusion, 1,920 mg/m2 = 3,000 mg (100 % of original dose 1,920 mg/m2), Intravenous, 1 Day/Dose, 1 of 4 cycles Dose modification: 1,920 mg/m2 (original dose 1,920 mg/m2, Cycle 1, Reason: Provider Judgment)  for chemotherapy treatment.       CANCER STAGING: Cancer Staging Pancreatic cancer metastasized to liver [facility]) Staging form: Pancreas, AJCC 7th Edition - Clinical: Stage IV (T3, N1, M1) - Signed by Baird Cancer, PA-C on 03/16/2014 - Pathologic: No stage assigned - Unsigned    INTERVAL HISTORY:  Ms. Yontz 85 y.o. female returns for routine follow-up and consideration for next cycle of chemotherapy. She is here today alone.  She reports that her ankles swell in the evenings.  Denies any nausea, vomiting, or diarrhea. Denies any new pains. Had not noticed any recent bleeding such as epistaxis, hematuria or hematochezia. Denies recent chest pain on exertion, shortness of breath on minimal exertion, pre-syncopal episodes, or palpitations. Denies any numbness or tingling in hands or feet. Denies any recent fevers, infections, or recent hospitalizations. Patient reports appetite at 100% and energy level at 100%.      REVIEW OF SYSTEMS:  Review of Systems  Cardiovascular: Positive for leg swelling.  All other systems reviewed and are negative.    PAST MEDICAL/SURGICAL HISTORY:  Past Medical History:  Diagnosis Date  . Anemia due to antineoplastic chemotherapy 09/12/2015   Started Aranesp 500 mcg on 09/12/2015  . Anxiety   . Chronic diarrhea   . Depression   . DNR (do not resuscitate) 08/16/2015  . Erosive esophagitis   . GERD (gastroesophageal reflux disease)   . Hx of adenomatous colonic polyps    tubular adenomas, last found in 2008  . Hyperplastic colon polyp 03/19/10   tcs by Dr. Gala Romney  . Hypertension 20 years   . Kidney stone    hx/  crushed   . Opioid contract exists 04/18/2015   With Dr. Moshe Cipro  . Pancreatic cancer ([place]) 02/2014  . Pancreatic cancer metastasized to liver ([place]) 03/10/2014  . Schatzki's ring 08/26/10   Last dilated on EGD by Dr. Trevor Iha [facility], linear gastric erosions, BI hemigastrectomy   Past Surgical History:  Procedure Laterality Date  . BREAST LUMPECTOMY Left   . bunion removal     from both feet   . CHOLECYSTECTOMY  1965   . COLONOSCOPY  03/19/2010   DR Gala Romney,, normal TI, pancolonic diverticula, random colon bx neg., hyperplastic polyps removed  . ESOPHAGOGASTRODUODENOSCOPY  11/22/2003   DR Gala Romney, erosive RE, Billroth I  . ESOPHAGOGASTRODUODENOSCOPY  08/26/10   Dr. Gala Romney- moderate severe ERE, Scahtzki ring s/p dilation, Billroth I, linear gastric erosions, bx-gastric xanthelasma  . LEFT SHOULDER SURGERY  2009   DR HARRISON  . ORIF ANKLE FRACTURE Right 05/22/2013   Procedure: OPEN REDUCTION INTERNAL FIXATION (ORIF) RIGHT ANKLE FRACTURE;  Surgeon: Sanjuana Kava, MD;  Location: [facility];  Service:  Orthopedics;  Laterality: Right;  . stomach ulcer  50 years ago    had some of her stomach removed      SOCIAL HISTORY:  Social History   Socioeconomic History  . Marital status: Widowed    Spouse name: Not on file  . Number of children: 2  . Years of education: 104  . Highest education level: 12th grade  Occupational History  . Occupation: retired from Estate manager/land agent: RETIRED  Social Needs  . Financial resource strain: Not hard at all  . Food insecurity:    Worry: Never true    Inability: Never true  . Transportation needs:    Medical: No    Non-medical: No  Tobacco Use  . Smoking status: Current Some Day Smoker    Packs/day: 0.50    Years: 60.00    Pack years: 30.00    Types: Cigarettes    Last attempt to quit: 09/09/2016    Years since quitting: 2.0  . Smokeless tobacco: Never Used  Substance and Sexual Activity  . Alcohol use: No  . Drug use: No  . Sexual activity: Not  Currently  Lifestyle  . Physical activity:    Days per week: 3 days    Minutes per session: 30 min  . Stress: Not at all  Relationships  . Social connections:    Talks on phone: More than three times a week    Gets together: Three times a week    Attends religious service: 1 to 4 times per year    Active member of club or organization: No    Attends meetings of clubs or organizations: Never    Relationship status: Widowed  . Intimate partner violence:    Fear of current or ex partner: No    Emotionally abused: No    Physically abused: No    Forced sexual activity: No  Other Topics Concern  . Not on file  Social History Narrative   Lives with son alone     FAMILY HISTORY:  Family History  Problem Relation Age of Onset  . Hypertension Mother   . Kidney failure Brother        on dialysis  . Diabetes Sister   . Diabetes Sister   . Hypertension Father   . Kidney failure Sister   . Kidney Stones Brother   . Breast cancer Son   . Gout Son   . Liver disease Neg Hx   . Colon cancer Neg Hx     CURRENT MEDICATIONS:  Outpatient Encounter Medications as of 09/22/2018  Medication Sig Note  . acetaminophen (TYLENOL) 500 MG tablet Take 500 mg by mouth every 6 (six) hours as needed for mild pain or moderate pain.    Marland Kitchen amLODipine (NORVASC) 10 MG tablet Take 1 tablet (10 mg total) by mouth daily.   . clobetasol (TEMOVATE) 0.05 % external solution APP AA ON SCALP BID PRN NOT TO FACE GROIN OR UNDERARMS   . clonazePAM (KLONOPIN) 0.5 MG tablet Take one tablet daily for anxiety   . cycloSPORINE (RESTASIS) 0.05 % ophthalmic emulsion Place 1 drop into both eyes 2 (two) times daily.   . diphenhydrAMINE (BENADRYL) 50 MG tablet Take 1 tablet 1 hour prior to CT   . fluorometholone (FML) 0.1 % ophthalmic suspension INSTILL 1 DROP INTO EACH EYE THREE TIMES DAILY FOR 14 DAYS   . fluticasone (CUTIVATE) 0.05 % cream APPLY AA FACE BID PRN   . lidocaine-prilocaine (EMLA) cream Apply 1  application  topically as needed. Apply to portacath site as needed   . metoprolol tartrate (LOPRESSOR) 25 MG tablet Take one tablet once daily for blood pressure   . mirabegron ER (MYRBETRIQ) 50 MG TB24 tablet Take 1 tablet (50 mg total) by mouth daily.   . pantoprazole (PROTONIX) 20 MG tablet Take 1 tablet by mouth daily   . predniSONE (DELTASONE) 50 MG tablet Take 1 tablet 13 hours prior to your CT. Then take 1 tablet 7 hours prior to your CT. Then take 1 tablet 1 hour prior to your CT   . predniSONE (STERAPRED UNI-PAK 21 TAB) 5 MG (21) TBPK tablet Use as directed on package 07/21/2018: Taking for itching on her neck and head  . traMADol (ULTRAM) 50 MG tablet Take one tablet once daily, as needed, for uncontrolled arthritic pain   . [DISCONTINUED] prochlorperazine (COMPAZINE) 10 MG tablet Take 1 tablet (10 mg total) by mouth every 6 (six) hours as needed (Nausea or vomiting).    Facility-Administered Encounter Medications as of 09/22/2018  Medication  . [DISCONTINUED] sodium chloride flush (NS) 0.9 % injection 10 mL    ALLERGIES:  Allergies  Allergen Reactions  . Iohexol Hives and Shortness Of Breath    **Patient can not have IV contrast, pt has breakthrough reaction even after 13 hour premeds** Do not give IV contrast PATIENT HAD TO BE TAKEN TO ED, C/O WHELPS, HIVES, DIFFICULTY BREATHING. PATIENT GIVEN IV CONTRAST AFTER 13 HOUR PRE MEDS ON 01/09/2017 WITH NO REACTION NOTED. On 10/19/17 patient had reaction despite premedication.    Marland Kitchen Aciphex [Rabeprazole Sodium] Other (See Comments)    unknown  . Amlodipine Besylate-Valsartan     Rash to high-dose (5/320)  . Esomeprazole Magnesium   . Omeprazole Other (See Comments)    Patient states "medication didn't work"  . Ciprofloxacin Rash  . Penicillins Swelling and Rash    Has patient had a PCN reaction causing immediate rash, facial/tongue/throat swelling, SOB or lightheadedness with hypotension: Yes Has patient had a PCN reaction causing severe rash  involving mucus membranes or skin necrosis: Yes Has patient had a PCN reaction that required hospitalization: No Has patient had a PCN reaction occurring within the last 10 years: yes If all of the above answers are "NO", then may proceed with Cephalosporin use.   [name] [Benzonatate] Itching    Full head, face, neck, and chest itching.      PHYSICAL EXAM:  ECOG Performance status: 1  Vitals:   09/22/18 0851  BP: (!) 173/53  Pulse: (!) 51  Resp: 18  Temp: 97.6 F (36.4 C)  SpO2: 100%   Filed Weights   09/22/18 0851  Weight: 121 lb 9.6 oz (55.2 kg)    Physical Exam Vitals signs reviewed.  Constitutional:      Appearance: Normal appearance.  Cardiovascular:     Rate and Rhythm: Normal rate and regular rhythm.     Heart sounds: Normal heart sounds.  Pulmonary:     Effort: Pulmonary effort is normal.     Breath sounds: Normal breath sounds.  Abdominal:     General: There is no distension.     Palpations: Abdomen is soft. There is no mass.  Musculoskeletal:        General: No swelling.  Skin:    General: Skin is warm.  Neurological:     General: No focal deficit present.     Mental Status: She is alert and oriented to person, place, and time.  Psychiatric:  Mood and Affect: Mood normal.        Behavior: Behavior normal.      LABORATORY DATA:  I have reviewed the labs as listed.  CBC    Component Value Date/Time   WBC 5.0 09/22/2018 0839   RBC 3.41 (L) 09/22/2018 0839   HGB 10.3 (L) 09/22/2018 0839   HCT 31.5 (L) 09/22/2018 0839   PLT 127 (L) 09/22/2018 0839   MCV 92.4 09/22/2018 0839   MCH 30.2 09/22/2018 0839   MCHC 32.7 09/22/2018 0839   RDW 16.4 (H) 09/22/2018 0839   LYMPHSABS 0.8 09/22/2018 0839   MONOABS 0.4 09/22/2018 0839   EOSABS 0.2 09/22/2018 0839   BASOSABS 0.0 09/22/2018 0839   CMP Latest Ref Rng & Units 09/22/2018 09/08/2018 08/25/2018  Glucose 70 - 99 mg/dL 91 99 118(H)  BUN 8 - 23 mg/dL 23 24(H) 35(H)  Creatinine 0.44  - 1.00 mg/dL 1.29(H) 1.40(H) 1.46(H)  Sodium 135 - 145 mmol/L 143 139 139  Potassium 3.5 - 5.1 mmol/L 4.1 4.2 4.4  Chloride 98 - 111 mmol/L 107 107 107  CO2 22 - 32 mmol/L '26 24 24  ' Calcium 8.9 - 10.3 mg/dL 8.4(L) 8.5(L) 8.4(L)  Total Protein 6.5 - 8.1 g/dL 6.9 7.0 7.1  Total Bilirubin 0.3 - 1.2 mg/dL 0.7 0.5 0.6  Alkaline Phos 38 - 126 U/L 137(H) 161(H) 163(H)  AST 15 - 41 U/L 13(L) 16 19  ALT 0 - 44 U/L '10 12 14       ' DIAGNOSTIC IMAGING:  I have independently reviewed the scans and discussed with the patient.   I have reviewed [name] Lick LPN's note and agree with the documentation.  I personally performed a face-to-face visit, made revisions and my assessment and plan is as follows.    ASSESSMENT & PLAN:   Pancreatic cancer metastasized to liver ([place]) 1.  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 28 days from 01/28/2017 until 10/07/2017 with progression. - 21 cycles of infusional 5-FU and Onivyde from 10/21/2017 through 09/08/2018 with progression.    -I discussed the results of the CT CAP dated 09/20/2018 which shows 2 cm hypodense lesion in the segment 2, previously 1.2 cm.  2.6 cm hypodense lesion in segment 7 previously 2.1 cm. -Her CA 19-9 has gone up to 171 on 08/25/2018 from 104 prior to that. - Based on these new findings, I have recommended treatment change.  She has not received oxaliplatin based regimen. - We talked about starting her on FOLFOX based regimen.  She does have some tingling in the toes from prior Abraxane. - We talked about side effects in detail.  I will start her on lower dose of oxaliplatin at 65 mg per metered square.  We will also continue lower dose of infusional 5-FU at 1920 mg per metered square. - I plan to  repeat scans after 3 months.  We will make a referral to our geneticist for BRCA mutation analysis.  2.  Weight loss: -Her weight has been stable.  She will continue Ensure 3 cans/day.  She will also continue Marinol.  3.  Genetic testing: -We will make a referral for genetic testing.  We will also consider MSI testing in the future.  However it requires repeat biopsy.  Total time spent is 40 minutes with more than 50% of the time spent face-to-face discussing scan results, treatment change, side effects and coordination of care.    Orders placed this encounter:  No orders of the defined types were placed in this encounter.     Derek Jack, MD [place] [phone]

## 2018-09-22 NOTE — Progress Notes (Signed)
Treatment given today per MD orders. Tolerated infusion without adverse affects. Vital signs stable. No complaints at this time. 5FU pump infusing per protocol. Run visible in the L corner of pump.  Discharged from clinic ambulatory. F/U with [facility] as scheduled.

## 2018-09-22 NOTE — Progress Notes (Signed)
ON PATHWAY REGIMEN - Pancreatic  No Change  Continue With Treatment as Ordered.     A cycle is every 14 days:     Liposomal irinotecan      Leucovorin      5-Fluorouracil   **Always confirm dose/schedule in your pharmacy ordering system**  Patient Characteristics: Adenocarcinoma, Metastatic Disease, Second Line, MSS/pMMR or MSI Unknown, If Gemcitabine/Nab-Paclitaxel (Abraxane(R)) or If Gemcitabine First Line Histology: Adenocarcinoma Current evidence of distant metastases<= Yes AJCC T Category: T3 AJCC N Category: N1 AJCC M Category: M1 AJCC 8 Stage Grouping: IV Line of Therapy: Second Line Microsatellite/Mismatch Repair Status: Unknown Intent of Therapy: Non-Curative / Palliative Intent, Discussed with Patient

## 2018-09-22 NOTE — Progress Notes (Signed)
DISCONTINUE ON PATHWAY REGIMEN - Pancreatic     A cycle is every 14 days:     Liposomal irinotecan      Leucovorin      5-Fluorouracil   **Always confirm dose/schedule in your pharmacy ordering system**  REASON: Disease Progression PRIOR TREATMENT: [id number]: Liposomal Irinotecan 70 mg/m2 + 5-Fluorouracil 2400 mg/m2 CIV + Leucovorin 400 mg/m2 q14 Days TREATMENT RESPONSE: Progressive Disease (PD)  START ON PATHWAY REGIMEN - Pancreatic Adenocarcinoma     A cycle is every 14 days:     Oxaliplatin      Leucovorin      5-Fluorouracil      5-Fluorouracil   **Always confirm dose/schedule in your pharmacy ordering system**  Patient Characteristics: Metastatic Disease, Third Line and Beyond, MSS/pMMR or MSI Unknown Current evidence of distant metastases<= Yes AJCC T Category: [place] AJCC N Category: NX AJCC M Category: M1 AJCC 8 Stage Grouping: IV Line of Therapy: Third Engineer, civil (consulting) Status: Unknown Intent of Therapy: Non-Curative / Palliative Intent, Discussed with Patient

## 2018-09-22 NOTE — Assessment & Plan Note (Signed)
1.  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 28 days from 01/28/2017 until 10/07/2017 with progression. - 21 cycles of infusional 5-FU and Onivyde from 10/21/2017 through 09/08/2018 with progression.    -I discussed the results of the CT CAP dated 09/20/2018 which shows 2 cm hypodense lesion in the segment 2, previously 1.2 cm.  2.6 cm hypodense lesion in segment 7 previously 2.1 cm. -Her CA 19-9 has gone up to 171 on 08/25/2018 from 104 prior to that. - Based on these new findings, I have recommended treatment change.  She has not received oxaliplatin based regimen. - We talked about starting her on FOLFOX based regimen.  She does have some tingling in the toes from prior Abraxane. - We talked about side effects in detail.  I will start her on lower dose of oxaliplatin at 65 mg per metered square.  We will also continue lower dose of infusional 5-FU at 1920 mg per metered square. - I plan to repeat scans after 3 months.  We will make a referral to our geneticist for BRCA mutation analysis.  2.  Weight loss: -Her weight has been stable.  She will continue Ensure 3 cans/day.  She will also continue Marinol.  3.  Genetic testing: -We will make a referral for genetic testing.  We will also consider MSI testing in the future.  However it requires repeat biopsy.   

## 2018-09-22 NOTE — Patient Instructions (Signed)
[facility] Discharge Instructions for Patients Receiving Chemotherapy  Today you received the following chemotherapy agents   To help prevent nausea and vomiting after your treatment, we encourage you to take your nausea medication   If you develop nausea and vomiting that is not controlled by your nausea medication, call the clinic.   BELOW ARE SYMPTOMS THAT SHOULD BE REPORTED IMMEDIATELY:  *FEVER GREATER THAN 100.5 F  *CHILLS WITH OR WITHOUT FEVER  NAUSEA AND VOMITING THAT IS NOT CONTROLLED WITH YOUR NAUSEA MEDICATION  *UNUSUAL SHORTNESS OF BREATH  *UNUSUAL BRUISING OR BLEEDING  TENDERNESS IN MOUTH AND THROAT WITH OR WITHOUT PRESENCE OF ULCERS  *URINARY PROBLEMS  *BOWEL PROBLEMS  UNUSUAL RASH Items with * indicate a potential emergency and should be followed up as soon as possible.  Feel free to call the clinic should you have any questions or concerns. The clinic phone number is [phone].  Please show the CHEMO ALERT CARD at check-in to the Emergency Department and triage nurse.   

## 2018-09-22 NOTE — Addendum Note (Signed)
Addended by: Benjiman Core D on: 09/22/2018 10:43 AM   Modules accepted: Orders

## 2018-09-22 NOTE — Progress Notes (Signed)
Pt presents today for treatment and doctor's appointment. VSS. BP slightly elevated. MAR reviewed. Pt has no complaints of any changes since the last visit. R knee swollen per pt. No change in the swelling. MD aware per pt's words.

## 2018-09-22 NOTE — Patient Instructions (Addendum)
[place] at [facility] Discharge Instructions  You were seen today by Dr. Delton Coombes. He went over your recent lab results. He will start you on a new treatment today. He will see you back in 2 weeks for labs and follow up.  DRINK AND EAT EVERYTHING AT ROOM TEMPERATURE FOR THE FIRST FOUR DAYS AFTER TREATMENT  Thank you for choosing [place] at [facility] to provide your oncology and hematology care.  To afford each patient quality time with our provider, please arrive at least 15 minutes before your scheduled appointment time.   If you have a lab appointment with the [facility] please come in thru the  Main Entrance and check in at the main information desk  You need to re-schedule your appointment should you arrive 10 or more minutes late.  We strive to give you quality time with our providers, and arriving late affects you and other patients whose appointments are after yours.  Also, if you no show three or more times for appointments you may be dismissed from the clinic at the providers discretion.     Again, thank you for choosing [facility].  Our hope is that these requests will decrease the amount of time that you wait before being seen by our physicians.       _____________________________________________________________  Should you have questions after your visit to [facility], please contact our office at [phone] between the hours of 8:00 a.m. and 4:30 p.m.  Voicemails left after 4:00 p.m. will not be returned until the following business day.  For prescription refill requests, have your pharmacy contact our office and allow 72 hours.    Cancer Center Support Programs:   > Cancer Support Group  2nd Tuesday of the month 1pm-2pm, Journey Room

## 2018-09-24 ENCOUNTER — Other Ambulatory Visit: Payer: Self-pay

## 2018-09-24 ENCOUNTER — Encounter (HOSPITAL_COMMUNITY): Payer: Self-pay

## 2018-09-24 ENCOUNTER — Inpatient Hospital Stay (HOSPITAL_COMMUNITY): Payer: Medicare HMO

## 2018-09-24 VITALS — BP 147/51 | HR 53 | Temp 97.5°F | Resp 16

## 2018-09-24 DIAGNOSIS — C787 Secondary malignant neoplasm of liver and intrahepatic bile duct: Secondary | ICD-10-CM | POA: Diagnosis not present

## 2018-09-24 DIAGNOSIS — C259 Malignant neoplasm of pancreas, unspecified: Secondary | ICD-10-CM

## 2018-09-24 DIAGNOSIS — Z5111 Encounter for antineoplastic chemotherapy: Secondary | ICD-10-CM | POA: Diagnosis not present

## 2018-09-24 DIAGNOSIS — C252 Malignant neoplasm of tail of pancreas: Secondary | ICD-10-CM | POA: Diagnosis not present

## 2018-09-24 MED ORDER — SODIUM CHLORIDE 0.9% FLUSH
10.0000 mL | INTRAVENOUS | Status: DC | PRN
  Administered 2018-09-24: 10 mL
  Filled 2018-09-24: qty 10

## 2018-09-24 MED ORDER — HEPARIN SOD (PORK) LOCK FLUSH 100 UNIT/ML IV SOLN
500.0000 [IU] | Freq: Once | INTRAVENOUS | Status: AC | PRN
  Administered 2018-09-24: 500 [IU]

## 2018-09-24 NOTE — Patient Instructions (Signed)
[facility] at [facility]  Discharge Instructions:   _______________________________________________________________  Thank you for choosing [facility] at [facility] to provide your oncology and hematology care.  To afford each patient quality time with our providers, please arrive at least 15 minutes before your scheduled appointment.  You need to re-schedule your appointment if you arrive 10 or more minutes late.  We strive to give you quality time with our providers, and arriving late affects you and other patients whose appointments are after yours.  Also, if you no show three or more times for appointments you may be dismissed from the clinic.  Again, thank you for choosing [facility] at [facility]. Our hope is that these requests will allow you access to exceptional care and in a timely manner. _______________________________________________________________  If you have questions after your visit, please contact our office at [phone] between the hours of 8:30 a.m. and 5:00 p.m. Voicemails left after 4:30 p.m. will not be returned until the following business day. _______________________________________________________________  For prescription refill requests, have your pharmacy contact our office. _______________________________________________________________  Recommendations made by the consultant and any test results will be sent to your referring physician. _______________________________________________________________ 

## 2018-09-24 NOTE — Progress Notes (Signed)
Patient called stating her pump had been beeping off and on all night and wanted to come in to have it checked.    Patient to treatment room with the chemotherapy pump intact.  Patient stated the message would read "high pressure" and after her son moved the tubing the beeping would stop. The patient stated this occurred off and on all night.  Port site clean and dry with no bruising or swelling noted at site. Denied tenderness, burning, or pain.  Tegaderm dressing intact.  Pump reading "run" and delivered the 5FU while standing beside the patient.  No s/s of distress noted.    Reviewed with Dr. Delton Coombes the chemotherapy pump and amount of 12.5 ml of 5FU left at rate 3.6ml/hr.  The patient does not want to stay and receive the rest of the medication.  Ok to let the patient go verbal order Dr. Delton Coombes.   Patients pump volume 11.4 with disconnect.  Port difficulty flushing with nacl syringe.  Patient stated she asked two days ago to have extra tegaderm and gauze dressing to be applied due to it itching with the normal dressing.  Tegaderm dressing released and needle noted to be at an angle and not straight from access on Wednesday.  Port needle then flushed easily x 2 with normal saline syringes with good blood return noted.  No bruising or swelling noted at site and denied pain with flush. Patient also denied aching, and tightness.   Gauze dressing wet to touch.  Patient denied bathing with chemotherapy pump.  Band aid applied.  Instructed the patient to notify the CC for any pain, bruising, swelling, or discomfort with understanding verbalized.  VSs with discharge and left ambulatory with no s/s of distress note.

## 2018-09-28 ENCOUNTER — Telehealth (HOSPITAL_COMMUNITY): Payer: Self-pay

## 2018-09-28 NOTE — Telephone Encounter (Signed)
Chemotherapy 24 hour phone call.  No answer and no answering machine.  Called home and mobile number.

## 2018-09-30 ENCOUNTER — Telehealth (HOSPITAL_COMMUNITY): Payer: Self-pay | Admitting: *Deleted

## 2018-09-30 NOTE — Telephone Encounter (Signed)
Pt called back and she reports feeling worse since receiving the new chemo medication on May 15 th. She denies any nausea or vomiting and no diarrhea. She complains of no energy and really no appetite but has been able to drink enough fluids.Her main complaint is fatigue at this time. Pt informed that we could bring her in for hydration tomorrow but she would rather wait and see how she feels tomorrow morning. Pt encouraged to call us for hydration or any other issues with understanding verbalized

## 2018-10-11 ENCOUNTER — Inpatient Hospital Stay (HOSPITAL_COMMUNITY): Payer: Medicare HMO

## 2018-10-11 ENCOUNTER — Inpatient Hospital Stay (HOSPITAL_COMMUNITY): Payer: Medicare HMO | Attending: Hematology

## 2018-10-11 ENCOUNTER — Encounter (HOSPITAL_COMMUNITY): Payer: Self-pay | Admitting: Hematology

## 2018-10-11 ENCOUNTER — Other Ambulatory Visit: Payer: Self-pay

## 2018-10-11 ENCOUNTER — Inpatient Hospital Stay (HOSPITAL_BASED_OUTPATIENT_CLINIC_OR_DEPARTMENT_OTHER): Payer: Medicare HMO | Admitting: Hematology

## 2018-10-11 VITALS — BP 186/69 | HR 57 | Temp 97.7°F | Resp 16

## 2018-10-11 DIAGNOSIS — Z5111 Encounter for antineoplastic chemotherapy: Secondary | ICD-10-CM | POA: Insufficient documentation

## 2018-10-11 DIAGNOSIS — F17210 Nicotine dependence, cigarettes, uncomplicated: Secondary | ICD-10-CM

## 2018-10-11 DIAGNOSIS — C259 Malignant neoplasm of pancreas, unspecified: Secondary | ICD-10-CM

## 2018-10-11 DIAGNOSIS — Z5189 Encounter for other specified aftercare: Secondary | ICD-10-CM | POA: Diagnosis not present

## 2018-10-11 DIAGNOSIS — C252 Malignant neoplasm of tail of pancreas: Secondary | ICD-10-CM | POA: Insufficient documentation

## 2018-10-11 DIAGNOSIS — R69 Illness, unspecified: Secondary | ICD-10-CM | POA: Diagnosis not present

## 2018-10-11 DIAGNOSIS — C787 Secondary malignant neoplasm of liver and intrahepatic bile duct: Secondary | ICD-10-CM

## 2018-10-11 DIAGNOSIS — Z79899 Other long term (current) drug therapy: Secondary | ICD-10-CM

## 2018-10-11 DIAGNOSIS — R634 Abnormal weight loss: Secondary | ICD-10-CM

## 2018-10-11 LAB — COMPREHENSIVE METABOLIC PANEL
ALT: 13 U/L (ref 0–44)
AST: 19 U/L (ref 15–41)
Albumin: 3.5 g/dL (ref 3.5–5.0)
Alkaline Phosphatase: 135 U/L — ABNORMAL HIGH (ref 38–126)
Anion gap: 12 (ref 5–15)
BUN: 28 mg/dL — ABNORMAL HIGH (ref 8–23)
CO2: 24 mmol/L (ref 22–32)
Calcium: 8.6 mg/dL — ABNORMAL LOW (ref 8.9–10.3)
Chloride: 104 mmol/L (ref 98–111)
Creatinine, Ser: 1.44 mg/dL — ABNORMAL HIGH (ref 0.44–1.00)
GFR calc Af Amer: 38 mL/min — ABNORMAL LOW (ref >60)
GFR calc non Af Amer: 33 mL/min — ABNORMAL LOW (ref >60)
Glucose, Bld: 110 mg/dL — ABNORMAL HIGH (ref 70–99)
Potassium: 4.2 mmol/L (ref 3.5–5.1)
Sodium: 140 mmol/L (ref 135–145)
Total Bilirubin: 0.8 mg/dL (ref 0.3–1.2)
Total Protein: 6.6 g/dL (ref 6.5–8.1)

## 2018-10-11 LAB — CBC WITH DIFFERENTIAL/PLATELET
Abs Immature Granulocytes: 0 10*3/uL (ref 0.00–0.07)
Basophils Absolute: 0 10*3/uL (ref 0.0–0.1)
Basophils Relative: 1 %
Eosinophils Absolute: 0.4 10*3/uL (ref 0.0–0.5)
Eosinophils Relative: 10 %
HCT: 30.9 % — ABNORMAL LOW (ref 36.0–46.0)
Hemoglobin: 10.2 g/dL — ABNORMAL LOW (ref 12.0–15.0)
Immature Granulocytes: 0 %
Lymphocytes Relative: 29 %
Lymphs Abs: 1 10*3/uL (ref 0.7–4.0)
MCH: 30.7 pg (ref 26.0–34.0)
MCHC: 33 g/dL (ref 30.0–36.0)
MCV: 93.1 fL (ref 80.0–100.0)
Monocytes Absolute: 0.5 10*3/uL (ref 0.1–1.0)
Monocytes Relative: 15 %
Neutro Abs: 1.6 10*3/uL — ABNORMAL LOW (ref 1.7–7.7)
Neutrophils Relative %: 45 %
Platelets: 125 10*3/uL — ABNORMAL LOW (ref 150–400)
RBC: 3.32 MIL/uL — ABNORMAL LOW (ref 3.87–5.11)
RDW: 17.1 % — ABNORMAL HIGH (ref 11.5–15.5)
WBC: 3.5 10*3/uL — ABNORMAL LOW (ref 4.0–10.5)
nRBC: 0 % (ref 0.0–0.2)

## 2018-10-11 LAB — MAGNESIUM: Magnesium: 2.2 mg/dL (ref 1.7–2.4)

## 2018-10-11 LAB — LACTATE DEHYDROGENASE: LDH: 166 U/L (ref 98–192)

## 2018-10-11 MED ORDER — SODIUM CHLORIDE 0.9 % IV SOLN
1920.0000 mg/m2 | INTRAVENOUS | Status: DC
  Administered 2018-10-11: 3000 mg via INTRAVENOUS
  Filled 2018-10-11: qty 60

## 2018-10-11 MED ORDER — FLUOROURACIL CHEMO INJECTION 500 MG/10ML
320.0000 mg/m2 | Freq: Once | INTRAVENOUS | Status: AC
  Administered 2018-10-11: 500 mg via INTRAVENOUS
  Filled 2018-10-11: qty 10

## 2018-10-11 MED ORDER — SODIUM CHLORIDE 0.9% FLUSH
10.0000 mL | INTRAVENOUS | Status: DC | PRN
  Administered 2018-10-11: 10 mL
  Filled 2018-10-11: qty 10

## 2018-10-11 MED ORDER — SODIUM CHLORIDE 0.9 % IV SOLN
10.0000 mg | Freq: Once | INTRAVENOUS | Status: AC
  Administered 2018-10-11: 10 mg via INTRAVENOUS
  Filled 2018-10-11: qty 10

## 2018-10-11 MED ORDER — OXALIPLATIN CHEMO INJECTION 100 MG/20ML
65.0000 mg/m2 | Freq: Once | INTRAVENOUS | Status: AC
  Administered 2018-10-11: 100 mg via INTRAVENOUS
  Filled 2018-10-11: qty 20

## 2018-10-11 MED ORDER — LEUCOVORIN CALCIUM INJECTION 350 MG
400.0000 mg/m2 | Freq: Once | INTRAVENOUS | Status: AC
  Administered 2018-10-11: 628 mg via INTRAVENOUS
  Filled 2018-10-11: qty 31.4

## 2018-10-11 MED ORDER — DEXTROSE 5 % IV SOLN
Freq: Once | INTRAVENOUS | Status: AC
  Administered 2018-10-11: 09:00:00 via INTRAVENOUS

## 2018-10-11 MED ORDER — PALONOSETRON HCL INJECTION 0.25 MG/5ML
0.2500 mg | Freq: Once | INTRAVENOUS | Status: AC
  Administered 2018-10-11: 0.25 mg via INTRAVENOUS
  Filled 2018-10-11: qty 5

## 2018-10-11 MED ORDER — DEXTROSE 5 % IV SOLN
Freq: Once | INTRAVENOUS | Status: AC
  Administered 2018-10-11: 10:00:00 via INTRAVENOUS

## 2018-10-11 NOTE — Patient Instructions (Signed)
[facility] at [facility] Discharge Instructions  You were seen today by Dr. Katragadda. He went over your recent lab results. He will see you back in 2 weeks for labs and follow up.   Thank you for choosing [facility] at [facility] to provide your oncology and hematology care.  To afford each patient quality time with our provider, please arrive at least 15 minutes before your scheduled appointment time.   If you have a lab appointment with the Cancer Center please come in thru the  Main Entrance and check in at the main information desk  You need to re-schedule your appointment should you arrive 10 or more minutes late.  We strive to give you quality time with our providers, and arriving late affects you and other patients whose appointments are after yours.  Also, if you no show three or more times for appointments you may be dismissed from the clinic at the providers discretion.     Again, thank you for choosing [facility].  Our hope is that these requests will decrease the amount of time that you wait before being seen by our physicians.       _____________________________________________________________  Should you have questions after your visit to [facility], please contact our office at [phone] between the hours of 8:00 a.m. and 4:30 p.m.  Voicemails left after 4:00 p.m. will not be returned until the following business day.  For prescription refill requests, have your pharmacy contact our office and allow 72 hours.    Cancer Center Support Programs:   > Cancer Support Group  2nd Tuesday of the month 1pm-2pm, Journey Room    

## 2018-10-11 NOTE — Patient Instructions (Signed)
[facility] Discharge Instructions for Patients Receiving Chemotherapy   Beginning January 23rd 2017 lab work for the [facility] will be done in the  Main lab at [facility] on 1st floor. If you have a lab appointment with the [facility] please come in thru the  Main Entrance and check in at the main information desk   Today you received the following chemotherapy agents Oxaliplatin,Leucovorin and 5FU. Follow-up as scheduled and call for any questions or concerns  To help prevent nausea and vomiting after your treatment, we encourage you to take your nausea medication   If you develop nausea and vomiting, or diarrhea that is not controlled by your medication, call the clinic.  The clinic phone number is [phone]. Office hours are Monday-Friday 8:30am-5:00pm.  BELOW ARE SYMPTOMS THAT SHOULD BE REPORTED IMMEDIATELY:  *FEVER GREATER THAN 101.0 F  *CHILLS WITH OR WITHOUT FEVER  NAUSEA AND VOMITING THAT IS NOT CONTROLLED WITH YOUR NAUSEA MEDICATION  *UNUSUAL SHORTNESS OF BREATH  *UNUSUAL BRUISING OR BLEEDING  TENDERNESS IN MOUTH AND THROAT WITH OR WITHOUT PRESENCE OF ULCERS  *URINARY PROBLEMS  *BOWEL PROBLEMS  UNUSUAL RASH Items with * indicate a potential emergency and should be followed up as soon as possible. If you have an emergency after office hours please contact your primary care physician or go to the nearest emergency department.  Please call the clinic during office hours if you have any questions or concerns.   You may also contact the Patient Navigator at [phone] should you have any questions or need assistance in obtaining follow up care.      Resources For Cancer Patients and their Caregivers ? American Cancer Society: Can assist with transportation, wigs, general needs, runs Look Good Feel Better.        670-764-5552 ? Cancer Care: Provides financial assistance, online support groups, medication/co-pay assistance.   1-800-813-HOPE 614-782-4143) ? [place] Assists [place] cancer patients and their families through emotional , educational and financial support.  (351)541-0398 ? [place] DSS Where to apply for food stamps, Medicaid and utility assistance. 541-856-2917 ? [facility]: Transportation to medical appointments. 332-121-5402 ? Social Security Administration: May apply for disability if have a Stage IV cancer. 470-322-3796 217 494 7880 ? [employer], Disability and Transit Services: Assists with nutrition, care and transit needs. ([phone] [phone]

## 2018-10-11 NOTE — Assessment & Plan Note (Signed)
1.  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 28 days from 01/28/2017 until 10/07/2017 with progression. - 21 cycles of infusional 5-FU and Onivyde from 10/21/2017 through 09/08/2018 with progression.    -I discussed the results of the CT CAP dated 09/20/2018 which shows 2 cm hypodense lesion in the segment 2, previously 1.2 cm.  2.6 cm hypodense lesion in segment 7 previously 2.1 cm. -Her CA 19-9 has gone up to 171 on 08/25/2018 from 104 prior to that. - FOLFOX cycle 1 on 09/22/2018. -She experienced more tiredness during the first week.  She also lost some weight. -Her energy levels have improved during week 2.  Denies any tingling or numbness in extremities. -We have reviewed her blood work.  She will proceed with her cycle 2 today. -Her ANC is barely above normal.  Hence I have recommended adding Neulasta to the regimen. -We will see her back in 2 weeks of follow-up.  We will make referral to geneticist.  2.  Weight loss: - She had lost 3 pounds from last 2 weeks.  She is drinking Ensure 2 cans/day.  She is also continue Marinol 2.5 mg twice daily.  3.  Genetic testing: -We will make a referral for genetic testing.  We will also consider MSI testing in the future.  However it requires repeat biopsy.

## 2018-10-11 NOTE — Progress Notes (Signed)
[place], [place] [id number]   CLINIC:  Medical Oncology/Hematology  PCP:  Fayrene Helper, MD [address], [place] [address] [phone]   REASON FOR VISIT:  Follow-up for metastatic pancreatic cancer to the liver   BRIEF ONCOLOGIC HISTORY:    Pancreatic cancer metastasized to liver ([place])   03/10/2014 Initial Diagnosis    Pancreatic cancer metastasized to liver    03/22/2014 - 03/05/2016 Chemotherapy    Abraxane/Gemzar days 1, 8, every 28 days.  Day 15 was cancelled due to leukopenia and thrombocytopenia on day 15 cycle 1.    05/24/2014 Treatment Plan Change    Day 8 of cycle 3 is held with ANC of 1.1    06/05/2014 Imaging    CT C/A/P . Interval decrease in size of the pancreatic tail mass.Improved hepatic metastatic disease. No new lesions. No CT findings for metastatic disease involving the chest.    08/09/2014 Tumor Marker    CA 19-9= 33 (WNL)    10/26/2014 Imaging    MRI- Continued interval decrease in size of the hepatic metastatic lesions and no new lesions are identified. Continued decrease in size of the pancreatic tail lesion.    01/03/2015 Tumor Marker    Results for EVERLEIGH, COLCLASURE (MRN [id number]) as of 01/18/2015 12:52  01/03/2015 10:00 CA 19-9: 14     01/17/2015 Imaging    MRI- Response to therapy of hepatic metastasis.  Similar size of a pancreatic tail lesion.    03/20/2015 Imaging    MRI-L spine- Severe disc space narrowing at L2-L3, with endplate reactive changes. Large disc extrusion into the ventral epidural space,central to the RIGHT with a cephalad migrated free fragment. Additional Large disc extrusion into the retroperitone...    03/22/2015 Imaging    CT pelvis- No evidence for metastatic disease within the pelvis.    07/24/2015 Imaging    MRI abd- Continued response to therapy, with no residual detectable liver metastases. No new sites of metastatic disease in the abdomen.     08/15/2015 Code  Status     She confirms desire for DNR status.    12/21/2015 Imaging    MRI liver- Severe image degradation due to motion artifact reducing diagnostic sensitivity and specificity. Reduce conspicuity of the pancreatic tail lesion suggesting further improvement. The original liver lesions have resolved.    03/05/2016 Treatment Plan Change    Chemotherapy holiday/Break after 2 years worth of treatment    04/07/2016 Imaging    CT chest- When compared to recent chest CT, new minimally displaced anterior left sixth rib fracture. Slight increase in subcarinal adenopathy    04/28/2016 Imaging    Bone density- BMD as determined from Femur Neck Right is 0.705 g/cm2 with a T-Score of -2.4. This patient is considered osteopenic according to [facility]) criteria. Compared with the prior study on 02/23/2013, the BMD of the lumbar spine/rt. femoral neck show a statistically significant decrease.     05/23/2016 Imaging    MRI abd- 1. Exam is significantly degraded by patient respiratory motion. Consider follow-up exams with CT abdomen with without contrast per pancreatic protocol. 2. Fullness in tail of pancreas is more prominent and potentially increased in size. Recommend close attention on follow-up. (Consider CT as above) 3. No explanation for back pain.    09/11/2016 Imaging    MRI pancreas- Interval progression of pancreatic tail lesion. New differential perfusion right liver with areas of heterogeneity. Underlying metastatic disease  in this region not excluded.    09/11/2016 Progression    MRi in conjunction with rising CA 19-9 are indicative of relapse of disease.    01/09/2017 Progression    CT C/A/P: 2.1 x 2.9 cm lesion in the pancreatic tail and abutting the splenic hilum, corresponding to known primary pancreatic neoplasm, mildly increased.  Scattered small hypoenhancing lesions in the liver measuring up to 10 mm, approximately 8-10 in number, suspicious for  hepatic metastases.  No findings specific for metastatic disease in the chest.  Additional ancillary findings as above.     01/27/2017 - 10/20/2017 Chemotherapy    The patient had gemcitabine (GEMZAR) 1,292 mg in sodium chloride 0.9 % 250 mL chemo infusion, 800 mg/m2 = 1,292 mg, Intravenous,  Once, 9 of 12 cycles Administration: 1,292 mg (01/28/2017), 1,292 mg (02/11/2017), 1,292 mg (02/25/2017), 1,292 mg (03/11/2017), 1,292 mg (03/25/2017), 1,292 mg (04/08/2017), 1,292 mg (04/22/2017), 1,292 mg (04/29/2017), 1,292 mg (05/20/2017), 1,292 mg (06/03/2017), 1,292 mg (06/24/2017), 1,292 mg (07/08/2017), 1,292 mg (07/29/2017), 1,292 mg (08/12/2017), 1,292 mg (08/26/2017), 1,292 mg (09/09/2017), 1,292 mg (09/23/2017), 1,292 mg (10/07/2017)  for chemotherapy treatment.     10/21/2017 - 09/22/2018 Chemotherapy    The patient had leucovorin 700 mg in dextrose 5 % 250 mL infusion, 644 mg, Intravenous,  Once, 22 of 22 cycles Administration: 700 mg (10/21/2017), 700 mg (11/04/2017), 700 mg (11/18/2017), 700 mg (12/02/2017), 700 mg (12/16/2017), 700 mg (12/30/2017), 700 mg (01/13/2018), 700 mg (01/27/2018), 700 mg (02/10/2018), 700 mg (02/24/2018), 700 mg (03/10/2018), 700 mg (03/24/2018), 700 mg (04/28/2018), 700 mg (05/17/2018), 700 mg (05/31/2018), 700 mg (06/16/2018), 700 mg (06/30/2018), 700 mg (07/21/2018), 700 mg (08/04/2018), 700 mg (08/25/2018), 700 mg (09/08/2018) ondansetron (ZOFRAN) 8 mg in sodium chloride 0.9 % 50 mL IVPB, , Intravenous,  Once, 21 of 21 cycles Administration:  (11/04/2017),  (01/27/2018),  (02/10/2018),  (02/24/2018),  (03/10/2018),  (03/24/2018),  (04/28/2018),  (05/17/2018),  (05/31/2018),  (06/16/2018),  (06/30/2018),  (07/21/2018),  (08/04/2018),  (08/25/2018),  (09/08/2018) fluorouracil (ADRUCIL) 3,850 mg in sodium chloride 0.9 % 73 mL chemo infusion, 2,400 mg/m2 = 3,850 mg, Intravenous, 1 Day/Dose, 22 of 22 cycles Dose modification: 1,920 mg/m2 (original dose 2,400 mg/m2, Cycle 2, Reason: Provider Judgment, Comment:  mucisitis) Administration: 3,850 mg (10/21/2017), 3,100 mg (11/04/2017), 3,100 mg (11/18/2017), 3,100 mg (12/02/2017), 3,100 mg (12/16/2017), 3,100 mg (12/30/2017), 3,100 mg (01/13/2018), 3,100 mg (01/27/2018), 3,100 mg (02/10/2018), 3,100 mg (02/24/2018), 3,100 mg (03/10/2018), 3,100 mg (03/24/2018), 3,100 mg (04/28/2018), 3,100 mg (05/17/2018), 3,100 mg (05/31/2018), 3,100 mg (06/16/2018), 3,100 mg (06/30/2018), 3,100 mg (07/21/2018), 3,100 mg (08/04/2018), 3,100 mg (08/25/2018), 3,100 mg (09/08/2018) irinotecan LIPOSOME (ONIVYDE) 81.7 mg in sodium chloride 0.9 % 500 mL chemo infusion, 50 mg/m2 = 81.7 mg (100 % of original dose 50 mg/m2), Intravenous, Once, 22 of 22 cycles Dose modification: 50 mg/m2 (original dose 50 mg/m2, Cycle 1, Reason: Provider Judgment), 50 mg/m2 (original dose 50 mg/m2, Cycle 2, Reason: Provider Judgment) Administration: 81.7 mg (10/21/2017), 81.7 mg (11/04/2017), 81.7 mg (11/18/2017), 81.7 mg (12/02/2017), 81.7 mg (12/16/2017), 81.7 mg (12/30/2017), 81.7 mg (01/13/2018), 81.7 mg (01/27/2018), 81.7 mg (02/10/2018), 81.7 mg (02/24/2018), 81.7 mg (03/10/2018), 81.7 mg (03/24/2018), 81.7 mg (04/28/2018), 81.7 mg (05/17/2018), 81.7 mg (05/31/2018), 81.7 mg (06/16/2018), 81.7 mg (06/30/2018), 81.7 mg (07/21/2018), 81.7 mg (08/04/2018), 81.7 mg (08/25/2018), 81.7 mg (09/08/2018)  for chemotherapy treatment.     09/22/2018 -  Chemotherapy    The patient had palonosetron (ALOXI) injection 0.25 mg, 0.25 mg, Intravenous,  Once, 1 of 6 cycles Administration:  0.25 mg (09/22/2018) leucovorin 628 mg in dextrose 5 % 250 mL infusion, 400 mg/m2 = 628 mg, Intravenous,  Once, 1 of 6 cycles Administration: 628 mg (09/22/2018) oxaliplatin (ELOXATIN) 100 mg in dextrose 5 % 500 mL chemo infusion, 65 mg/m2 = 100 mg (100 % of original dose 65 mg/m2), Intravenous,  Once, 1 of 6 cycles Dose modification: 65 mg/m2 (original dose 65 mg/m2, Cycle 1, Reason: Provider Judgment) Administration: 100 mg (09/22/2018) fluorouracil (ADRUCIL) chemo injection  500 mg, 320 mg/m2 = 500 mg (100 % of original dose 320 mg/m2), Intravenous,  Once, 1 of 6 cycles Dose modification: 320 mg/m2 (original dose 320 mg/m2, Cycle 1, Reason: Provider Judgment) Administration: 500 mg (09/22/2018) fluorouracil (ADRUCIL) 3,000 mg in sodium chloride 0.9 % 90 mL chemo infusion, 1,920 mg/m2 = 3,000 mg (100 % of original dose 1,920 mg/m2), Intravenous, 1 Day/Dose, 1 of 6 cycles Dose modification: 1,920 mg/m2 (original dose 1,920 mg/m2, Cycle 1, Reason: Provider Judgment) Administration: 3,000 mg (09/22/2018)  for chemotherapy treatment.       CANCER STAGING: Cancer Staging Pancreatic cancer metastasized to liver [facility]) Staging form: Pancreas, AJCC 7th Edition - Clinical: Stage IV (T3, N1, M1) - Signed by Baird Cancer, PA-C on 03/16/2014 - Pathologic: No stage assigned - Unsigned    INTERVAL HISTORY:  Ms. Gundry 85 y.o. female returns for routine follow-up and consideration for next cycle of chemotherapy. She is here today alone. She states that she felt worse with the last treatment. She states that she had no energy for the first week after her treatment. She states that she continues to take her appetite booster. Denies any nausea, vomiting, or diarrhea. Denies any new pains. Had not noticed any recent bleeding such as epistaxis, hematuria or hematochezia. Denies recent chest pain on exertion, shortness of breath on minimal exertion, pre-syncopal episodes, or palpitations. Denies any numbness or tingling in hands or feet. Denies any recent fevers, infections, or recent hospitalizations. Patient reports appetite at 50 % and energy level at 25 %.   REVIEW OF SYSTEMS:  Review of Systems  Constitutional: Positive for fatigue.  Cardiovascular: Positive for leg swelling.     PAST MEDICAL/SURGICAL HISTORY:  Past Medical History:  Diagnosis Date  . Anemia due to antineoplastic chemotherapy 09/12/2015   Started Aranesp 500 mcg on 09/12/2015  . Anxiety   . Chronic  diarrhea   . Depression   . DNR (do not resuscitate) 08/16/2015  . Erosive esophagitis   . GERD (gastroesophageal reflux disease)   . Hx of adenomatous colonic polyps    tubular adenomas, last found in 2008  . Hyperplastic colon polyp 03/19/10   tcs by Dr. Gala Romney  . Hypertension 20 years   . Kidney stone    hx/ crushed   . Opioid contract exists 04/18/2015   With Dr. Moshe Cipro  . Pancreatic cancer ([place]) 02/2014  . Pancreatic cancer metastasized to liver ([place]) 03/10/2014  . Schatzki's ring 08/26/10   Last dilated on EGD by Dr. Trevor Iha [facility], linear gastric erosions, BI hemigastrectomy   Past Surgical History:  Procedure Laterality Date  . BREAST LUMPECTOMY Left   . bunion removal     from both feet   . CHOLECYSTECTOMY  1965   . COLONOSCOPY  03/19/2010   DR Gala Romney,, normal TI, pancolonic diverticula, random colon bx neg., hyperplastic polyps removed  . ESOPHAGOGASTRODUODENOSCOPY  11/22/2003   DR Gala Romney, erosive RE, Billroth I  . ESOPHAGOGASTRODUODENOSCOPY  08/26/10   Dr. Gala Romney- moderate severe ERE, Scahtzki ring s/p  dilation, Billroth I, linear gastric erosions, bx-gastric xanthelasma  . LEFT SHOULDER SURGERY  2009   DR HARRISON  . ORIF ANKLE FRACTURE Right 05/22/2013   Procedure: OPEN REDUCTION INTERNAL FIXATION (ORIF) RIGHT ANKLE FRACTURE;  Surgeon: Sanjuana Kava, MD;  Location: [facility];  Service: Orthopedics;  Laterality: Right;  . stomach ulcer  50 years ago    had some of her stomach removed      SOCIAL HISTORY:  Social History   Socioeconomic History  . Marital status: Widowed    Spouse name: Not on file  . Number of children: 2  . Years of education: 19  . Highest education level: 12th grade  Occupational History  . Occupation: retired from Estate manager/land agent: RETIRED  Social Needs  . Financial resource strain: Not hard at all  . Food insecurity:    Worry: Never true    Inability: Never true  . Transportation needs:    Medical: No    Non-medical: No  Tobacco Use   . Smoking status: Current Some Day Smoker    Packs/day: 0.50    Years: 60.00    Pack years: 30.00    Types: Cigarettes    Last attempt to quit: 09/09/2016    Years since quitting: 2.0  . Smokeless tobacco: Never Used  Substance and Sexual Activity  . Alcohol use: No  . Drug use: No  . Sexual activity: Not Currently  Lifestyle  . Physical activity:    Days per week: 3 days    Minutes per session: 30 min  . Stress: Not at all  Relationships  . Social connections:    Talks on phone: More than three times a week    Gets together: Three times a week    Attends religious service: 1 to 4 times per year    Active member of club or organization: No    Attends meetings of clubs or organizations: Never    Relationship status: Widowed  . Intimate partner violence:    Fear of current or ex partner: No    Emotionally abused: No    Physically abused: No    Forced sexual activity: No  Other Topics Concern  . Not on file  Social History Narrative   Lives with son alone     FAMILY HISTORY:  Family History  Problem Relation Age of Onset  . Hypertension Mother   . Kidney failure Brother        on dialysis  . Diabetes Sister   . Diabetes Sister   . Hypertension Father   . Kidney failure Sister   . Kidney Stones Brother   . Breast cancer Son   . Gout Son   . Liver disease Neg Hx   . Colon cancer Neg Hx     CURRENT MEDICATIONS:  Outpatient Encounter Medications as of 10/11/2018  Medication Sig Note  . acetaminophen (TYLENOL) 500 MG tablet Take 500 mg by mouth every 6 (six) hours as needed for mild pain or moderate pain.    Marland Kitchen amLODipine (NORVASC) 10 MG tablet Take 1 tablet (10 mg total) by mouth daily.   . clobetasol (TEMOVATE) 0.05 % external solution APP AA ON SCALP BID PRN NOT TO FACE GROIN OR UNDERARMS   . clonazePAM (KLONOPIN) 0.5 MG tablet Take one tablet daily for anxiety   . cycloSPORINE (RESTASIS) 0.05 % ophthalmic emulsion Place 1 drop into both eyes 2 (two) times daily.    . diphenhydrAMINE (BENADRYL) 50 MG tablet Take  1 tablet 1 hour prior to CT   . fluorometholone (FML) 0.1 % ophthalmic suspension INSTILL 1 DROP INTO EACH EYE THREE TIMES DAILY FOR 14 DAYS   . fluticasone (CUTIVATE) 0.05 % cream APPLY AA FACE BID PRN   . fluticasone (FLONASE) 50 MCG/ACT nasal spray Place 1 spray into both nostrils daily.   Marland Kitchen lidocaine (XYLOCAINE) 2 % solution RINSE WITH 5ML AS NEEDED TO RELIEVE MOUTH PAIN.   Marland Kitchen lidocaine-prilocaine (EMLA) cream Apply 1 application topically as needed. Apply to portacath site as needed   . metoprolol tartrate (LOPRESSOR) 25 MG tablet Take one tablet once daily for blood pressure   . mirabegron ER (MYRBETRIQ) 50 MG TB24 tablet Take 1 tablet (50 mg total) by mouth daily.   . pantoprazole (PROTONIX) 20 MG tablet Take 1 tablet by mouth daily   . predniSONE (DELTASONE) 50 MG tablet Take 1 tablet 13 hours prior to your CT. Then take 1 tablet 7 hours prior to your CT. Then take 1 tablet 1 hour prior to your CT   . traMADol (ULTRAM) 50 MG tablet Take one tablet once daily, as needed, for uncontrolled arthritic pain   . [DISCONTINUED] predniSONE (STERAPRED UNI-PAK 21 TAB) 5 MG (21) TBPK tablet Use as directed on package 07/21/2018: Taking for itching on her neck and head  . [DISCONTINUED] prochlorperazine (COMPAZINE) 10 MG tablet Take 1 tablet (10 mg total) by mouth every 6 (six) hours as needed (Nausea or vomiting).    No facility-administered encounter medications on file as of 10/11/2018.     ALLERGIES:  Allergies  Allergen Reactions  . Iohexol Hives and Shortness Of Breath    **Patient can not have IV contrast, pt has breakthrough reaction even after 13 hour premeds** Do not give IV contrast PATIENT HAD TO BE TAKEN TO ED, C/O WHELPS, HIVES, DIFFICULTY BREATHING. PATIENT GIVEN IV CONTRAST AFTER 13 HOUR PRE MEDS ON 01/09/2017 WITH NO REACTION NOTED. On 10/19/17 patient had reaction despite premedication.    Marland Kitchen Aciphex [Rabeprazole Sodium] Other (See  Comments)    unknown  . Amlodipine Besylate-Valsartan     Rash to high-dose (5/320)  . Esomeprazole Magnesium   . Omeprazole Other (See Comments)    Patient states "medication didn't work"  . Ciprofloxacin Rash  . Penicillins Swelling and Rash    Has patient had a PCN reaction causing immediate rash, facial/tongue/throat swelling, SOB or lightheadedness with hypotension: Yes Has patient had a PCN reaction causing severe rash involving mucus membranes or skin necrosis: Yes Has patient had a PCN reaction that required hospitalization: No Has patient had a PCN reaction occurring within the last 10 years: yes If all of the above answers are "NO", then may proceed with Cephalosporin use.   [name] [Benzonatate] Itching    Full head, face, neck, and chest itching.      PHYSICAL EXAM:  ECOG Performance status: 1  Vitals:   10/11/18 0830  BP: (!) 165/59  Pulse: (!) 50  Resp: 18  Temp: 97.6 F (36.4 C)  SpO2: 100%   Filed Weights   10/11/18 0830  Weight: 119 lb (54 kg)    Physical Exam Vitals signs reviewed.  Constitutional:      Appearance: Normal appearance.  Cardiovascular:     Rate and Rhythm: Normal rate and regular rhythm.     Heart sounds: Normal heart sounds.  Pulmonary:     Effort: Pulmonary effort is normal.     Breath sounds: Normal breath sounds.  Abdominal:  General: There is no distension.     Palpations: Abdomen is soft. There is no mass.  Musculoskeletal:        General: No swelling.  Skin:    General: Skin is warm.  Neurological:     General: No focal deficit present.     Mental Status: She is alert and oriented to person, place, and time.  Psychiatric:        Mood and Affect: Mood normal.        Behavior: Behavior normal.      LABORATORY DATA:  I have reviewed the labs as listed.  CBC    Component Value Date/Time   WBC 3.5 (L) 10/11/2018 0834   RBC 3.32 (L) 10/11/2018 0834   HGB 10.2 (L) 10/11/2018 0834   HCT 30.9 (L)  10/11/2018 0834   PLT 125 (L) 10/11/2018 0834   MCV 93.1 10/11/2018 0834   MCH 30.7 10/11/2018 0834   MCHC 33.0 10/11/2018 0834   RDW 17.1 (H) 10/11/2018 0834   LYMPHSABS 1.0 10/11/2018 0834   MONOABS 0.5 10/11/2018 0834   EOSABS 0.4 10/11/2018 0834   BASOSABS 0.0 10/11/2018 0834   CMP Latest Ref Rng & Units 10/11/2018 09/22/2018 09/08/2018  Glucose 70 - 99 mg/dL 110(H) 91 99  BUN 8 - 23 mg/dL 28(H) 23 24(H)  Creatinine 0.44 - 1.00 mg/dL 1.44(H) 1.29(H) 1.40(H)  Sodium 135 - 145 mmol/L 140 143 139  Potassium 3.5 - 5.1 mmol/L 4.2 4.1 4.2  Chloride 98 - 111 mmol/L 104 107 107  CO2 22 - 32 mmol/L '24 26 24  ' Calcium 8.9 - 10.3 mg/dL 8.6(L) 8.4(L) 8.5(L)  Total Protein 6.5 - 8.1 g/dL 6.6 6.9 7.0  Total Bilirubin 0.3 - 1.2 mg/dL 0.8 0.7 0.5  Alkaline Phos 38 - 126 U/L 135(H) 137(H) 161(H)  AST 15 - 41 U/L 19 13(L) 16  ALT 0 - 44 U/L '13 10 12       ' DIAGNOSTIC IMAGING:  I have independently reviewed the scans and discussed with the patient.   I have reviewed Venita Lick LPN's note and agree with the documentation.  I personally performed a face-to-face visit, made revisions and my assessment and plan is as follows.    ASSESSMENT & PLAN:   Pancreatic cancer metastasized to liver ([place]) 1.  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 28 days from 01/28/2017 until 10/07/2017 with progression. - 21 cycles of infusional 5-FU and Onivyde from 10/21/2017 through 09/08/2018 with progression.    -I discussed the results of the CT CAP dated 09/20/2018 which shows 2 cm hypodense lesion in the segment 2, previously 1.2 cm.  2.6 cm hypodense lesion in segment 7 previously 2.1 cm. -Her CA 19-9 has gone up to 171 on 08/25/2018 from 104 prior to that. - FOLFOX cycle 1  on 09/22/2018. -She experienced more tiredness during the first week.  She also lost some weight. -Her energy levels have improved during week 2.  Denies any tingling or numbness in extremities. -We have reviewed her blood work.  She will proceed with her cycle 2 today. -Her ANC is barely above normal.  Hence I have recommended adding Neulasta to the regimen. -We will see her back in 2 weeks of follow-up.  We will make referral to  geneticist.  2.  Weight loss: - She had lost 3 pounds from last 2 weeks.  She is drinking Ensure 2 cans/day.  She is also continue Marinol 2.5 mg twice daily.  3.  Genetic testing: -We will make a referral for genetic testing.  We will also consider MSI testing in the future.  However it requires repeat biopsy.    Total time spent is 25 minutes with more than 50% of time spent face-to-face discussing treatment plan and coordination of care.    Orders placed this encounter:  No orders of the defined types were placed in this encounter.     Derek Jack, MD [place] [phone]

## 2018-10-11 NOTE — Progress Notes (Signed)
[id number] Labs reviewed with and pt seen by Dr. Delton Coombes and pt approved for Chemo tx today per MD                                                                                     Alecia Lemming tolerated chemo tx well without complaints or incident. Pt discharged with 5FU pump infusing without issues. VSS upon discharge. Pt discharged self ambulatory using her cane in satisfactory condition

## 2018-10-13 ENCOUNTER — Encounter (HOSPITAL_COMMUNITY): Payer: Self-pay

## 2018-10-13 ENCOUNTER — Inpatient Hospital Stay (HOSPITAL_COMMUNITY): Payer: Medicare HMO

## 2018-10-13 ENCOUNTER — Other Ambulatory Visit: Payer: Self-pay

## 2018-10-13 VITALS — BP 173/60 | HR 63 | Temp 98.4°F | Resp 18

## 2018-10-13 DIAGNOSIS — C259 Malignant neoplasm of pancreas, unspecified: Secondary | ICD-10-CM

## 2018-10-13 DIAGNOSIS — Z5189 Encounter for other specified aftercare: Secondary | ICD-10-CM | POA: Diagnosis not present

## 2018-10-13 DIAGNOSIS — C252 Malignant neoplasm of tail of pancreas: Secondary | ICD-10-CM | POA: Diagnosis not present

## 2018-10-13 DIAGNOSIS — Z5111 Encounter for antineoplastic chemotherapy: Secondary | ICD-10-CM | POA: Diagnosis not present

## 2018-10-13 DIAGNOSIS — C787 Secondary malignant neoplasm of liver and intrahepatic bile duct: Secondary | ICD-10-CM | POA: Diagnosis not present

## 2018-10-13 MED ORDER — PEGFILGRASTIM INJECTION 6 MG/0.6ML ~~LOC~~
6.0000 mg | PREFILLED_SYRINGE | Freq: Once | SUBCUTANEOUS | Status: AC
  Administered 2018-10-13: 6 mg via SUBCUTANEOUS
  Filled 2018-10-13: qty 0.6

## 2018-10-13 MED ORDER — SODIUM CHLORIDE 0.9% FLUSH
10.0000 mL | INTRAVENOUS | Status: DC | PRN
  Administered 2018-10-13: 10 mL
  Filled 2018-10-13 (×2): qty 10

## 2018-10-13 MED ORDER — HEPARIN SOD (PORK) LOCK FLUSH 100 UNIT/ML IV SOLN
500.0000 [IU] | Freq: Once | INTRAVENOUS | Status: AC | PRN
  Administered 2018-10-13: 500 [IU]

## 2018-10-13 NOTE — Progress Notes (Signed)
[name] tolerated 5FU pump and Neulasta injection well without complaints or incident. 5FU pump discontinued with portacath flushed easily per protocol then de-accessed. VSS upon discharge. Pt discharged self ambulatory using her cane in satisfactory condition

## 2018-10-13 NOTE — Patient Instructions (Signed)
[place] at [facility] Discharge Instructions  5FU pump discontinued with portacath flushed per protocol. Received Neulasta injection as well today. Follow-up as scheduled. Call clinic for any questions or concerns   Thank you for choosing [place] at [facility] to provide your oncology and hematology care.  To afford each patient quality time with our provider, please arrive at least 15 minutes before your scheduled appointment time.   If you have a lab appointment with the [facility] please come in thru the  Main Entrance and check in at the main information desk  You need to re-schedule your appointment should you arrive 10 or more minutes late.  We strive to give you quality time with our providers, and arriving late affects you and other patients whose appointments are after yours.  Also, if you no show three or more times for appointments you may be dismissed from the clinic at the providers discretion.     Again, thank you for choosing [facility].  Our hope is that these requests will decrease the amount of time that you wait before being seen by our physicians.       _____________________________________________________________  Should you have questions after your visit to [facility], please contact our office at [phone] between the hours of 8:00 a.m. and 4:30 p.m.  Voicemails left after 4:00 p.m. will not be returned until the following business day.  For prescription refill requests, have your pharmacy contact our office and allow 72 hours.    Cancer Center Support Programs:   > Cancer Support Group  2nd Tuesday of the month 1pm-2pm, Journey Room

## 2018-10-21 ENCOUNTER — Other Ambulatory Visit (HOSPITAL_COMMUNITY): Payer: Medicare HMO

## 2018-10-21 ENCOUNTER — Encounter (HOSPITAL_COMMUNITY): Payer: Medicare HMO | Admitting: Genetic Counselor

## 2018-10-25 ENCOUNTER — Inpatient Hospital Stay (HOSPITAL_BASED_OUTPATIENT_CLINIC_OR_DEPARTMENT_OTHER): Payer: Medicare HMO | Admitting: Hematology

## 2018-10-25 ENCOUNTER — Inpatient Hospital Stay (HOSPITAL_COMMUNITY): Payer: Medicare HMO

## 2018-10-25 ENCOUNTER — Encounter (HOSPITAL_COMMUNITY): Payer: Self-pay | Admitting: Hematology

## 2018-10-25 ENCOUNTER — Other Ambulatory Visit: Payer: Self-pay

## 2018-10-25 VITALS — BP 167/54 | HR 56 | Temp 98.0°F | Resp 16

## 2018-10-25 DIAGNOSIS — C252 Malignant neoplasm of tail of pancreas: Secondary | ICD-10-CM

## 2018-10-25 DIAGNOSIS — R69 Illness, unspecified: Secondary | ICD-10-CM | POA: Diagnosis not present

## 2018-10-25 DIAGNOSIS — C259 Malignant neoplasm of pancreas, unspecified: Secondary | ICD-10-CM

## 2018-10-25 DIAGNOSIS — C787 Secondary malignant neoplasm of liver and intrahepatic bile duct: Secondary | ICD-10-CM

## 2018-10-25 DIAGNOSIS — F17210 Nicotine dependence, cigarettes, uncomplicated: Secondary | ICD-10-CM | POA: Diagnosis not present

## 2018-10-25 DIAGNOSIS — Z79899 Other long term (current) drug therapy: Secondary | ICD-10-CM

## 2018-10-25 DIAGNOSIS — Z5111 Encounter for antineoplastic chemotherapy: Secondary | ICD-10-CM | POA: Diagnosis not present

## 2018-10-25 DIAGNOSIS — Z5189 Encounter for other specified aftercare: Secondary | ICD-10-CM | POA: Diagnosis not present

## 2018-10-25 DIAGNOSIS — R634 Abnormal weight loss: Secondary | ICD-10-CM | POA: Diagnosis not present

## 2018-10-25 LAB — CBC WITH DIFFERENTIAL/PLATELET
Abs Immature Granulocytes: 0.06 K/uL (ref 0.00–0.07)
Basophils Absolute: 0 K/uL (ref 0.0–0.1)
Basophils Relative: 0 %
Eosinophils Absolute: 0.3 K/uL (ref 0.0–0.5)
Eosinophils Relative: 3 %
HCT: 31.1 % — ABNORMAL LOW (ref 36.0–46.0)
Hemoglobin: 10.3 g/dL — ABNORMAL LOW (ref 12.0–15.0)
Immature Granulocytes: 1 %
Lymphocytes Relative: 11 %
Lymphs Abs: 1 K/uL (ref 0.7–4.0)
MCH: 31.2 pg (ref 26.0–34.0)
MCHC: 33.1 g/dL (ref 30.0–36.0)
MCV: 94.2 fL (ref 80.0–100.0)
Monocytes Absolute: 0.6 K/uL (ref 0.1–1.0)
Monocytes Relative: 7 %
Neutro Abs: 7 K/uL (ref 1.7–7.7)
Neutrophils Relative %: 78 %
Platelets: 103 K/uL — ABNORMAL LOW (ref 150–400)
RBC: 3.3 MIL/uL — ABNORMAL LOW (ref 3.87–5.11)
RDW: 18.1 % — ABNORMAL HIGH (ref 11.5–15.5)
WBC: 9 K/uL (ref 4.0–10.5)
nRBC: 0 % (ref 0.0–0.2)

## 2018-10-25 LAB — COMPREHENSIVE METABOLIC PANEL
ALT: 13 U/L (ref 0–44)
AST: 18 U/L (ref 15–41)
Albumin: 3.7 g/dL (ref 3.5–5.0)
Alkaline Phosphatase: 170 U/L — ABNORMAL HIGH (ref 38–126)
Anion gap: 13 (ref 5–15)
BUN: 28 mg/dL — ABNORMAL HIGH (ref 8–23)
CO2: 22 mmol/L (ref 22–32)
Calcium: 8.8 mg/dL — ABNORMAL LOW (ref 8.9–10.3)
Chloride: 106 mmol/L (ref 98–111)
Creatinine, Ser: 1.47 mg/dL — ABNORMAL HIGH (ref 0.44–1.00)
GFR calc Af Amer: 37 mL/min — ABNORMAL LOW (ref >60)
GFR calc non Af Amer: 32 mL/min — ABNORMAL LOW (ref >60)
Glucose, Bld: 104 mg/dL — ABNORMAL HIGH (ref 70–99)
Potassium: 4.2 mmol/L (ref 3.5–5.1)
Sodium: 141 mmol/L (ref 135–145)
Total Bilirubin: 0.6 mg/dL (ref 0.3–1.2)
Total Protein: 7.1 g/dL (ref 6.5–8.1)

## 2018-10-25 LAB — LACTATE DEHYDROGENASE: LDH: 168 U/L (ref 98–192)

## 2018-10-25 MED ORDER — PALONOSETRON HCL INJECTION 0.25 MG/5ML
0.2500 mg | Freq: Once | INTRAVENOUS | Status: AC
  Administered 2018-10-25: 11:00:00 0.25 mg via INTRAVENOUS
  Filled 2018-10-25: qty 5

## 2018-10-25 MED ORDER — LEUCOVORIN CALCIUM INJECTION 350 MG
400.0000 mg/m2 | Freq: Once | INTRAVENOUS | Status: AC
  Administered 2018-10-25: 628 mg via INTRAVENOUS
  Filled 2018-10-25: qty 31.4

## 2018-10-25 MED ORDER — OXALIPLATIN CHEMO INJECTION 100 MG/20ML
65.0000 mg/m2 | Freq: Once | INTRAVENOUS | Status: AC
  Administered 2018-10-25: 12:00:00 100 mg via INTRAVENOUS
  Filled 2018-10-25: qty 20

## 2018-10-25 MED ORDER — FLUOROURACIL CHEMO INJECTION 500 MG/10ML
320.0000 mg/m2 | Freq: Once | INTRAVENOUS | Status: AC
  Administered 2018-10-25: 500 mg via INTRAVENOUS
  Filled 2018-10-25: qty 10

## 2018-10-25 MED ORDER — SODIUM CHLORIDE 0.9 % IV SOLN
10.0000 mg | Freq: Once | INTRAVENOUS | Status: AC
  Administered 2018-10-25: 10 mg via INTRAVENOUS
  Filled 2018-10-25: qty 10

## 2018-10-25 MED ORDER — DEXTROSE 5 % IV SOLN
Freq: Once | INTRAVENOUS | Status: AC
  Administered 2018-10-25: 11:00:00 via INTRAVENOUS

## 2018-10-25 MED ORDER — SODIUM CHLORIDE 0.9 % IV SOLN
1920.0000 mg/m2 | INTRAVENOUS | Status: DC
  Administered 2018-10-25: 3000 mg via INTRAVENOUS
  Filled 2018-10-25: qty 60

## 2018-10-25 MED ORDER — SODIUM CHLORIDE 0.9% FLUSH
10.0000 mL | INTRAVENOUS | Status: DC | PRN
  Administered 2018-10-25: 10 mL
  Filled 2018-10-25: qty 10

## 2018-10-25 NOTE — Patient Instructions (Addendum)
[facility] at [facility] Discharge Instructions  You were seen today by Dr. Katragadda. He went over your recent lab results. He will see you back in 2 weeks for labs, treatment and follow up.   Thank you for choosing [facility] at [facility] to provide your oncology and hematology care.  To afford each patient quality time with our provider, please arrive at least 15 minutes before your scheduled appointment time.   If you have a lab appointment with the Cancer Center please come in thru the  Main Entrance and check in at the main information desk  You need to re-schedule your appointment should you arrive 10 or more minutes late.  We strive to give you quality time with our providers, and arriving late affects you and other patients whose appointments are after yours.  Also, if you no show three or more times for appointments you may be dismissed from the clinic at the providers discretion.     Again, thank you for choosing [facility].  Our hope is that these requests will decrease the amount of time that you wait before being seen by our physicians.       _____________________________________________________________  Should you have questions after your visit to [facility], please contact our office at [phone] between the hours of 8:00 a.m. and 4:30 p.m.  Voicemails left after 4:00 p.m. will not be returned until the following business day.  For prescription refill requests, have your pharmacy contact our office and allow 72 hours.    Cancer Center Support Programs:   > Cancer Support Group  2nd Tuesday of the month 1pm-2pm, Journey Room    

## 2018-10-25 NOTE — Progress Notes (Signed)
Patient seen by Dr. Delton Coombes with lab review and ok to treat today verbal order.   Patient tolerated chemotherapy with no complaints voiced.  Port site clean and dry with no bruising or swelling noted at site.  Good blood return noted before and after administration of chemotherapy.  Chemotherapy pump connected with no alarms noted.   Patient left ambulatory with VSS and no s/s of distress noted.

## 2018-10-25 NOTE — Progress Notes (Signed)
[place], [place] [address]   CLINIC:  Medical Oncology/Hematology  PCP:  Fayrene Helper, MD [address], [place] [address] [phone]   REASON FOR VISIT:  Follow-up for metastatic pancreatic cancer to the liver   BRIEF ONCOLOGIC HISTORY:  Oncology History  Pancreatic cancer metastasized to liver ([place])  03/10/2014 Initial Diagnosis   Pancreatic cancer metastasized to liver   03/22/2014 - 03/05/2016 Chemotherapy   Abraxane/Gemzar days 1, 8, every 28 days.  Day 15 was cancelled due to leukopenia and thrombocytopenia on day 15 cycle 1.   05/24/2014 Treatment Plan Change   Day 8 of cycle 3 is held with ANC of 1.1   06/05/2014 Imaging   CT C/A/P . Interval decrease in size of the pancreatic tail mass.Improved hepatic metastatic disease. No new lesions. No CT findings for metastatic disease involving the chest.   08/09/2014 Tumor Marker   CA 19-9= 33 (WNL)   10/26/2014 Imaging   MRI- Continued interval decrease in size of the hepatic metastatic lesions and no new lesions are identified. Continued decrease in size of the pancreatic tail lesion.   01/03/2015 Tumor Marker   Results for [name], [name] (MRN [id number]) as of 01/18/2015 12:52  01/03/2015 10:00 CA 19-9: 14    01/17/2015 Imaging   MRI- Response to therapy of hepatic metastasis.  Similar size of a pancreatic tail lesion.   03/20/2015 Imaging   MRI-L spine- Severe disc space narrowing at L2-L3, with endplate reactive changes. Large disc extrusion into the ventral epidural space,central to the RIGHT with a cephalad migrated free fragment. Additional Large disc extrusion into the retroperitone...   03/22/2015 Imaging   CT pelvis- No evidence for metastatic disease within the pelvis.   07/24/2015 Imaging   MRI abd- Continued response to therapy, with no residual detectable liver metastases. No new sites of metastatic disease in the abdomen.    08/15/2015 Code Status     She confirms desire for DNR status.   12/21/2015 Imaging   MRI liver- Severe image degradation due to motion artifact reducing diagnostic sensitivity and specificity. Reduce conspicuity of the pancreatic tail lesion suggesting further improvement. The original liver lesions have resolved.   03/05/2016 Treatment Plan Change   Chemotherapy holiday/Break after 2 years worth of treatment   04/07/2016 Imaging   CT chest- When compared to recent chest CT, new minimally displaced anterior left sixth rib fracture. Slight increase in subcarinal adenopathy   04/28/2016 Imaging   Bone density- BMD as determined from Femur Neck Right is 0.705 g/cm2 with a T-Score of -2.4. This patient is considered osteopenic according to [facility]) criteria. Compared with the prior study on 02/23/2013, the BMD of the lumbar spine/rt. femoral neck show a statistically significant decrease.    05/23/2016 Imaging   MRI abd- 1. Exam is significantly degraded by patient respiratory motion. Consider follow-up exams with CT abdomen with without contrast per pancreatic protocol. 2. Fullness in tail of pancreas is more prominent and potentially increased in size. Recommend close attention on follow-up. (Consider CT as above) 3. No explanation for back pain.   09/11/2016 Imaging   MRI pancreas- Interval progression of pancreatic tail lesion. New differential perfusion right liver with areas of heterogeneity. Underlying metastatic disease in this region not excluded.   09/11/2016 Progression   MRi in conjunction with rising CA 19-9 are indicative of relapse of disease.   01/09/2017 Progression   CT C/A/P: 2.1 x 2.9  cm lesion in the pancreatic tail and abutting the splenic hilum, corresponding to known primary pancreatic neoplasm, mildly increased.  Scattered small hypoenhancing lesions in the liver measuring up to 10 mm, approximately 8-10 in number, suspicious for hepatic metastases.  No  findings specific for metastatic disease in the chest.  Additional ancillary findings as above.    01/27/2017 - 10/20/2017 Chemotherapy   The patient had gemcitabine (GEMZAR) 1,292 mg in sodium chloride 0.9 % 250 mL chemo infusion, 800 mg/m2 = 1,292 mg, Intravenous,  Once, 9 of 12 cycles Administration: 1,292 mg (01/28/2017), 1,292 mg (02/11/2017), 1,292 mg (02/25/2017), 1,292 mg (03/11/2017), 1,292 mg (03/25/2017), 1,292 mg (04/08/2017), 1,292 mg (04/22/2017), 1,292 mg (04/29/2017), 1,292 mg (05/20/2017), 1,292 mg (06/03/2017), 1,292 mg (06/24/2017), 1,292 mg (07/08/2017), 1,292 mg (07/29/2017), 1,292 mg (08/12/2017), 1,292 mg (08/26/2017), 1,292 mg (09/09/2017), 1,292 mg (09/23/2017), 1,292 mg (10/07/2017)  for chemotherapy treatment.    10/21/2017 - 09/22/2018 Chemotherapy   The patient had leucovorin 700 mg in dextrose 5 % 250 mL infusion, 644 mg, Intravenous,  Once, 22 of 22 cycles Administration: 700 mg (10/21/2017), 700 mg (11/04/2017), 700 mg (11/18/2017), 700 mg (12/02/2017), 700 mg (12/16/2017), 700 mg (12/30/2017), 700 mg (01/13/2018), 700 mg (01/27/2018), 700 mg (02/10/2018), 700 mg (02/24/2018), 700 mg (03/10/2018), 700 mg (03/24/2018), 700 mg (04/28/2018), 700 mg (05/17/2018), 700 mg (05/31/2018), 700 mg (06/16/2018), 700 mg (06/30/2018), 700 mg (07/21/2018), 700 mg (08/04/2018), 700 mg (08/25/2018), 700 mg (09/08/2018) ondansetron (ZOFRAN) 8 mg in sodium chloride 0.9 % 50 mL IVPB, , Intravenous,  Once, 21 of 21 cycles Administration:  (11/04/2017),  (01/27/2018),  (02/10/2018),  (02/24/2018),  (03/10/2018),  (03/24/2018),  (04/28/2018),  (05/17/2018),  (05/31/2018),  (06/16/2018),  (06/30/2018),  (07/21/2018),  (08/04/2018),  (08/25/2018),  (09/08/2018) fluorouracil (ADRUCIL) 3,850 mg in sodium chloride 0.9 % 73 mL chemo infusion, 2,400 mg/m2 = 3,850 mg, Intravenous, 1 Day/Dose, 22 of 22 cycles Dose modification: 1,920 mg/m2 (original dose 2,400 mg/m2, Cycle 2, Reason: Provider Judgment, Comment: mucisitis) Administration: 3,850 mg  (10/21/2017), 3,100 mg (11/04/2017), 3,100 mg (11/18/2017), 3,100 mg (12/02/2017), 3,100 mg (12/16/2017), 3,100 mg (12/30/2017), 3,100 mg (01/13/2018), 3,100 mg (01/27/2018), 3,100 mg (02/10/2018), 3,100 mg (02/24/2018), 3,100 mg (03/10/2018), 3,100 mg (03/24/2018), 3,100 mg (04/28/2018), 3,100 mg (05/17/2018), 3,100 mg (05/31/2018), 3,100 mg (06/16/2018), 3,100 mg (06/30/2018), 3,100 mg (07/21/2018), 3,100 mg (08/04/2018), 3,100 mg (08/25/2018), 3,100 mg (09/08/2018) irinotecan LIPOSOME (ONIVYDE) 81.7 mg in sodium chloride 0.9 % 500 mL chemo infusion, 50 mg/m2 = 81.7 mg (100 % of original dose 50 mg/m2), Intravenous, Once, 22 of 22 cycles Dose modification: 50 mg/m2 (original dose 50 mg/m2, Cycle 1, Reason: Provider Judgment), 50 mg/m2 (original dose 50 mg/m2, Cycle 2, Reason: Provider Judgment) Administration: 81.7 mg (10/21/2017), 81.7 mg (11/04/2017), 81.7 mg (11/18/2017), 81.7 mg (12/02/2017), 81.7 mg (12/16/2017), 81.7 mg (12/30/2017), 81.7 mg (01/13/2018), 81.7 mg (01/27/2018), 81.7 mg (02/10/2018), 81.7 mg (02/24/2018), 81.7 mg (03/10/2018), 81.7 mg (03/24/2018), 81.7 mg (04/28/2018), 81.7 mg (05/17/2018), 81.7 mg (05/31/2018), 81.7 mg (06/16/2018), 81.7 mg (06/30/2018), 81.7 mg (07/21/2018), 81.7 mg (08/04/2018), 81.7 mg (08/25/2018), 81.7 mg (09/08/2018)  for chemotherapy treatment.    09/22/2018 -  Chemotherapy   The patient had palonosetron (ALOXI) injection 0.25 mg, 0.25 mg, Intravenous,  Once, 3 of 6 cycles Administration: 0.25 mg (09/22/2018), 0.25 mg (10/11/2018) pegfilgrastim (NEULASTA) injection 6 mg, 6 mg, Subcutaneous, Once, 2 of 5 cycles Administration: 6 mg (10/13/2018) leucovorin 628 mg in dextrose 5 % 250 mL infusion, 400 mg/m2 = 628 mg, Intravenous,  Once, 3 of 6 cycles  Administration: 628 mg (09/22/2018), 628 mg (10/11/2018) oxaliplatin (ELOXATIN) 100 mg in dextrose 5 % 500 mL chemo infusion, 65 mg/m2 = 100 mg (100 % of original dose 65 mg/m2), Intravenous,  Once, 3 of 6 cycles Dose modification: 65 mg/m2 (original dose 65  mg/m2, Cycle 1, Reason: Provider Judgment) Administration: 100 mg (09/22/2018), 100 mg (10/11/2018) fluorouracil (ADRUCIL) chemo injection 500 mg, 320 mg/m2 = 500 mg (100 % of original dose 320 mg/m2), Intravenous,  Once, 3 of 6 cycles Dose modification: 320 mg/m2 (original dose 320 mg/m2, Cycle 1, Reason: Provider Judgment) Administration: 500 mg (09/22/2018), 500 mg (10/11/2018) fluorouracil (ADRUCIL) 3,000 mg in sodium chloride 0.9 % 90 mL chemo infusion, 1,920 mg/m2 = 3,000 mg (100 % of original dose 1,920 mg/m2), Intravenous, 1 Day/Dose, 3 of 6 cycles Dose modification: 1,920 mg/m2 (original dose 1,920 mg/m2, Cycle 1, Reason: Provider Judgment) Administration: 3,000 mg (09/22/2018), 3,000 mg (10/11/2018)  for chemotherapy treatment.       CANCER STAGING: Cancer Staging Pancreatic cancer metastasized to liver [facility]) Staging form: Pancreas, AJCC 7th Edition - Clinical: Stage IV (T3, N1, M1) - Signed by Baird Cancer, PA-C on 03/16/2014 - Pathologic: No stage assigned - Unsigned    INTERVAL HISTORY:  Ms. Leaver 85 y.o. female is seen today for cycle 3 of chemotherapy.  She received cycle 2 on 10/11/2018.  Denied any chemotherapy related GI side effects including nausea, vomiting, diarrhea or constipation.  She is drinking 2 ensures per day.  Her weight has been stable.  She complained of leg swellings towards the end of the day.  Denies any tingling or numbness in the extremities.  Denies any severe cold sensitivity.  No fevers or infections noted.  No ER visits or hospitalizations.  Appetite and energy levels are 75%.   REVIEW OF SYSTEMS:  Review of Systems  Constitutional: Negative for fatigue.  Cardiovascular: Positive for leg swelling.  All other systems reviewed and are negative.    PAST MEDICAL/SURGICAL HISTORY:  Past Medical History:  Diagnosis Date  . Anemia due to antineoplastic chemotherapy 09/12/2015   Started Aranesp 500 mcg on 09/12/2015  . Anxiety   . Chronic diarrhea   .  Depression   . DNR (do not resuscitate) 08/16/2015  . Erosive esophagitis   . GERD (gastroesophageal reflux disease)   . Hx of adenomatous colonic polyps    tubular adenomas, last found in 2008  . Hyperplastic colon polyp 03/19/10   tcs by Dr. Gala Romney  . Hypertension 20 years   . Kidney stone    hx/ crushed   . Opioid contract exists 04/18/2015   With Dr. Moshe Cipro  . Pancreatic cancer ([place]) 02/2014  . Pancreatic cancer metastasized to liver ([place]) 03/10/2014  . Schatzki's ring 08/26/10   Last dilated on EGD by Dr. Trevor Iha [facility], linear gastric erosions, BI hemigastrectomy   Past Surgical History:  Procedure Laterality Date  . BREAST LUMPECTOMY Left   . bunion removal     from both feet   . CHOLECYSTECTOMY  1965   . COLONOSCOPY  03/19/2010   DR Gala Romney,, normal TI, pancolonic diverticula, random colon bx neg., hyperplastic polyps removed  . ESOPHAGOGASTRODUODENOSCOPY  11/22/2003   DR Gala Romney, erosive RE, Billroth I  . ESOPHAGOGASTRODUODENOSCOPY  08/26/10   Dr. Gala Romney- moderate severe ERE, Scahtzki ring s/p dilation, Billroth I, linear gastric erosions, bx-gastric xanthelasma  . LEFT SHOULDER SURGERY  2009   DR HARRISON  . ORIF ANKLE FRACTURE Right 05/22/2013   Procedure: OPEN REDUCTION INTERNAL FIXATION (ORIF)  RIGHT ANKLE FRACTURE;  Surgeon: Sanjuana Kava, MD;  Location: [facility];  Service: Orthopedics;  Laterality: Right;  . stomach ulcer  50 years ago    had some of her stomach removed      SOCIAL HISTORY:  Social History   Socioeconomic History  . Marital status: Widowed    Spouse name: Not on file  . Number of children: 2  . Years of education: 72  . Highest education level: 12th grade  Occupational History  . Occupation: retired from Estate manager/land agent: RETIRED  Social Needs  . Financial resource strain: Not hard at all  . Food insecurity    Worry: Never true    Inability: Never true  . Transportation needs    Medical: No    Non-medical: No  Tobacco Use  . Smoking  status: Current Some Day Smoker    Packs/day: 0.50    Years: 60.00    Pack years: 30.00    Types: Cigarettes    Last attempt to quit: 09/09/2016    Years since quitting: 2.1  . Smokeless tobacco: Never Used  Substance and Sexual Activity  . Alcohol use: No  . Drug use: No  . Sexual activity: Not Currently  Lifestyle  . Physical activity    Days per week: 3 days    Minutes per session: 30 min  . Stress: Not at all  Relationships  . Social connections    Talks on phone: More than three times a week    Gets together: Three times a week    Attends religious service: 1 to 4 times per year    Active member of club or organization: No    Attends meetings of clubs or organizations: Never    Relationship status: Widowed  . Intimate partner violence    Fear of current or ex partner: No    Emotionally abused: No    Physically abused: No    Forced sexual activity: No  Other Topics Concern  . Not on file  Social History Narrative   Lives with son alone     FAMILY HISTORY:  Family History  Problem Relation Age of Onset  . Hypertension Mother   . Kidney failure Brother        on dialysis  . Diabetes Sister   . Diabetes Sister   . Hypertension Father   . Kidney failure Sister   . Kidney Stones Brother   . Breast cancer Son   . Gout Son   . Liver disease Neg Hx   . Colon cancer Neg Hx     CURRENT MEDICATIONS:  Outpatient Encounter Medications as of 10/25/2018  Medication Sig  . acetaminophen (TYLENOL) 500 MG tablet Take 500 mg by mouth every 6 (six) hours as needed for mild pain or moderate pain.   Marland Kitchen amLODipine (NORVASC) 10 MG tablet Take 1 tablet (10 mg total) by mouth daily.  . clobetasol (TEMOVATE) 0.05 % external solution APP AA ON SCALP BID PRN NOT TO FACE GROIN OR UNDERARMS  . clonazePAM (KLONOPIN) 0.5 MG tablet Take one tablet daily for anxiety  . cycloSPORINE (RESTASIS) 0.05 % ophthalmic emulsion Place 1 drop into both eyes 2 (two) times daily.  . diphenhydrAMINE  (BENADRYL) 50 MG tablet Take 1 tablet 1 hour prior to CT  . fluorometholone (FML) 0.1 % ophthalmic suspension INSTILL 1 DROP INTO EACH EYE THREE TIMES DAILY FOR 14 DAYS  . fluticasone (CUTIVATE) 0.05 % cream APPLY AA FACE BID PRN  .  fluticasone (FLONASE) 50 MCG/ACT nasal spray Place 1 spray into both nostrils daily.  Marland Kitchen lidocaine (XYLOCAINE) 2 % solution RINSE WITH 5ML AS NEEDED TO RELIEVE MOUTH PAIN.  Marland Kitchen lidocaine-prilocaine (EMLA) cream Apply 1 application topically as needed. Apply to portacath site as needed  . metoprolol tartrate (LOPRESSOR) 25 MG tablet Take one tablet once daily for blood pressure  . mirabegron ER (MYRBETRIQ) 50 MG TB24 tablet Take 1 tablet (50 mg total) by mouth daily.  . pantoprazole (PROTONIX) 20 MG tablet Take 1 tablet by mouth daily  . predniSONE (DELTASONE) 50 MG tablet Take 1 tablet 13 hours prior to your CT. Then take 1 tablet 7 hours prior to your CT. Then take 1 tablet 1 hour prior to your CT  . traMADol (ULTRAM) 50 MG tablet Take one tablet once daily, as needed, for uncontrolled arthritic pain  . [DISCONTINUED] prochlorperazine (COMPAZINE) 10 MG tablet Take 1 tablet (10 mg total) by mouth every 6 (six) hours as needed (Nausea or vomiting).   No facility-administered encounter medications on file as of 10/25/2018.     ALLERGIES:  Allergies  Allergen Reactions  . Iohexol Hives and Shortness Of Breath    **Patient can not have IV contrast, pt has breakthrough reaction even after 13 hour premeds** Do not give IV contrast PATIENT HAD TO BE TAKEN TO ED, C/O WHELPS, HIVES, DIFFICULTY BREATHING. PATIENT GIVEN IV CONTRAST AFTER 13 HOUR PRE MEDS ON 01/09/2017 WITH NO REACTION NOTED. On 10/19/17 patient had reaction despite premedication.    Marland Kitchen Aciphex [Rabeprazole Sodium] Other (See Comments)    unknown  . Amlodipine Besylate-Valsartan     Rash to high-dose (5/320)  . Esomeprazole Magnesium   . Omeprazole Other (See Comments)    Patient states "medication didn't work"   . Ciprofloxacin Rash  . Penicillins Swelling and Rash    Has patient had a PCN reaction causing immediate rash, facial/tongue/throat swelling, SOB or lightheadedness with hypotension: Yes Has patient had a PCN reaction causing severe rash involving mucus membranes or skin necrosis: Yes Has patient had a PCN reaction that required hospitalization: No Has patient had a PCN reaction occurring within the last 10 years: yes If all of the above answers are "NO", then may proceed with Cephalosporin use.   [name] [Benzonatate] Itching    Full head, face, neck, and chest itching.      PHYSICAL EXAM:  ECOG Performance status: 1  Vitals:   10/25/18 0941  BP: (!) 153/53  Pulse: (!) 51  Resp: 16  Temp: 98 F (36.7 C)  SpO2: 100%   Filed Weights   10/25/18 0941  Weight: 119 lb (54 kg)    Physical Exam Vitals signs reviewed.  Constitutional:      Appearance: Normal appearance.  Cardiovascular:     Rate and Rhythm: Normal rate and regular rhythm.     Heart sounds: Normal heart sounds.  Pulmonary:     Effort: Pulmonary effort is normal.     Breath sounds: Normal breath sounds.  Abdominal:     General: There is no distension.     Palpations: Abdomen is soft. There is no mass.  Musculoskeletal:        General: No swelling.  Skin:    General: Skin is warm.  Neurological:     General: No focal deficit present.     Mental Status: She is alert and oriented to person, place, and time.  Psychiatric:        Mood and Affect:  Mood normal.        Behavior: Behavior normal.      LABORATORY DATA:  I have reviewed the labs as listed.  CBC    Component Value Date/Time   WBC 9.0 10/25/2018 0933   RBC 3.30 (L) 10/25/2018 0933   HGB 10.3 (L) 10/25/2018 0933   HCT 31.1 (L) 10/25/2018 0933   PLT 103 (L) 10/25/2018 0933   MCV 94.2 10/25/2018 0933   MCH 31.2 10/25/2018 0933   MCHC 33.1 10/25/2018 0933   RDW 18.1 (H) 10/25/2018 0933   LYMPHSABS 1.0 10/25/2018 0933    MONOABS 0.6 10/25/2018 0933   EOSABS 0.3 10/25/2018 0933   BASOSABS 0.0 10/25/2018 0933   CMP Latest Ref Rng & Units 10/25/2018 10/11/2018 09/22/2018  Glucose 70 - 99 mg/dL 104(H) 110(H) 91  BUN 8 - 23 mg/dL 28(H) 28(H) 23  Creatinine 0.44 - 1.00 mg/dL 1.47(H) 1.44(H) 1.29(H)  Sodium 135 - 145 mmol/L 141 140 143  Potassium 3.5 - 5.1 mmol/L 4.2 4.2 4.1  Chloride 98 - 111 mmol/L 106 104 107  CO2 22 - 32 mmol/L '22 24 26  '$ Calcium 8.9 - 10.3 mg/dL 8.8(L) 8.6(L) 8.4(L)  Total Protein 6.5 - 8.1 g/dL 7.1 6.6 6.9  Total Bilirubin 0.3 - 1.2 mg/dL 0.6 0.8 0.7  Alkaline Phos 38 - 126 U/L 170(H) 135(H) 137(H)  AST 15 - 41 U/L 18 19 13(L)  ALT 0 - 44 U/L '13 13 10       '$ DIAGNOSTIC IMAGING:  I have independently reviewed the scans and discussed with the patient.   I have reviewed Venita Lick LPN's note and agree with the documentation.  I personally performed a face-to-face visit, made revisions and my assessment and plan is as follows.    ASSESSMENT & PLAN:   Pancreatic cancer metastasized to liver ([place]) 1.  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 28 days from 01/28/2017 until 10/07/2017 with progression. - 21 cycles of infusional 5-FU and Onivyde from 10/21/2017 through 09/08/2018 with progression.    -CT CAP on 09/20/2018 shows a hypodense lesion in the segment 2, measuring 2 cm, previously 1.2 cm.  2.6 cm hypodense lesion in segment 7 previously measured 2.1 cm. -CA 19-9 has gone up to 171 on 08/25/2018 from 104 prior to that.  - 2 cycles of FOLFOX (20% dose reduced) on 09/22/2018 and 10/11/2018. -I have added Neulasta with cycle 2.  She has tolerated Neulasta very well. -She did not have any cold sensitivity or neuropathy.  I have reviewed her blood work.   She may proceed with cycle 3 at the same dose level. -We will see her back in 2 weeks for follow-up and next cycle.  I will check CA 19-9 at that time. -We will make a referral to geneticist for germline mutation testing.  2.  Weight loss: -She lost 3 pounds after cycle 1.  However her weight has been stable since last treatment. -She is drinking Ensure 2 cans/day.  She is also taking Marinol 2.5 mg twice daily.  3.  Genetic testing: -We will make a referral for genetic testing.  We will also consider MSI testing in the future.  However it requires repeat biopsy.    Total time spent is 25 minutes with more  than 50% of time spent face-to-face discussing treatment plan and coordination of care.    Orders placed this encounter:  Orders Placed This Encounter  Procedures  . CBC with Differential/Platelet  . Comprehensive metabolic panel  . Cancer antigen 19-9      Derek Jack, MD [place] [phone]

## 2018-10-25 NOTE — Assessment & Plan Note (Addendum)
1.  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 28 days from 01/28/2017 until 10/07/2017 with progression. - 21 cycles of infusional 5-FU and Onivyde from 10/21/2017 through 09/08/2018 with progression.    -CT CAP on 09/20/2018 shows a hypodense lesion in the segment 2, measuring 2 cm, previously 1.2 cm.  2.6 cm hypodense lesion in segment 7 previously measured 2.1 cm. -CA 19-9 has gone up to 171 on 08/25/2018 from 104 prior to that.  - 2 cycles of FOLFOX (20% dose reduced) on 09/22/2018 and 10/11/2018. -I have added Neulasta with cycle 2.  She has tolerated Neulasta very well. -She did not have any cold sensitivity or neuropathy.  I have reviewed her blood work.  She may proceed with cycle 3 at the same dose level. -We will see her back in 2 weeks for follow-up and next cycle.  I will check CA 19-9 at that time. -We will make a referral to geneticist for germline mutation testing.  2.  Weight loss: -She lost 3 pounds after cycle 1.  However her weight has been stable since last treatment. -She is drinking Ensure 2 cans/day.  She is also taking Marinol 2.5 mg twice daily.  3.  Genetic testing: -We will make a referral for genetic testing.  We will also consider MSI testing in the future.  However it requires repeat biopsy.

## 2018-10-26 ENCOUNTER — Other Ambulatory Visit: Payer: Self-pay

## 2018-10-26 MED ORDER — CLONAZEPAM 0.5 MG PO TABS: ORAL_TABLET | ORAL | 1 refills | Status: DC

## 2018-10-27 ENCOUNTER — Inpatient Hospital Stay (HOSPITAL_COMMUNITY): Payer: Medicare HMO

## 2018-10-27 ENCOUNTER — Other Ambulatory Visit: Payer: Self-pay

## 2018-10-27 DIAGNOSIS — C252 Malignant neoplasm of tail of pancreas: Secondary | ICD-10-CM | POA: Diagnosis not present

## 2018-10-27 DIAGNOSIS — C787 Secondary malignant neoplasm of liver and intrahepatic bile duct: Secondary | ICD-10-CM

## 2018-10-27 DIAGNOSIS — C259 Malignant neoplasm of pancreas, unspecified: Secondary | ICD-10-CM

## 2018-10-27 DIAGNOSIS — Z5189 Encounter for other specified aftercare: Secondary | ICD-10-CM | POA: Diagnosis not present

## 2018-10-27 DIAGNOSIS — Z5111 Encounter for antineoplastic chemotherapy: Secondary | ICD-10-CM | POA: Diagnosis not present

## 2018-10-27 MED ORDER — SODIUM CHLORIDE 0.9% FLUSH
10.0000 mL | INTRAVENOUS | Status: DC | PRN
  Administered 2018-10-27: 10 mL
  Filled 2018-10-27: qty 10

## 2018-10-27 MED ORDER — PEGFILGRASTIM INJECTION 6 MG/0.6ML ~~LOC~~
6.0000 mg | PREFILLED_SYRINGE | Freq: Once | SUBCUTANEOUS | Status: AC
  Administered 2018-10-27: 6 mg via SUBCUTANEOUS
  Filled 2018-10-27: qty 0.6

## 2018-10-27 MED ORDER — HEPARIN SOD (PORK) LOCK FLUSH 100 UNIT/ML IV SOLN
500.0000 [IU] | Freq: Once | INTRAVENOUS | Status: AC | PRN
  Administered 2018-10-27: 500 [IU]

## 2018-11-01 ENCOUNTER — Encounter: Payer: Medicare HMO | Admitting: Family Medicine

## 2018-11-08 ENCOUNTER — Ambulatory Visit (HOSPITAL_COMMUNITY): Payer: Medicare HMO | Admitting: Hematology

## 2018-11-08 ENCOUNTER — Other Ambulatory Visit (HOSPITAL_COMMUNITY): Payer: Self-pay | Admitting: Nurse Practitioner

## 2018-11-08 ENCOUNTER — Other Ambulatory Visit (HOSPITAL_COMMUNITY): Payer: Medicare HMO

## 2018-11-08 ENCOUNTER — Ambulatory Visit (HOSPITAL_COMMUNITY): Payer: Medicare HMO

## 2018-11-08 DIAGNOSIS — R3915 Urgency of urination: Secondary | ICD-10-CM

## 2018-11-10 ENCOUNTER — Encounter (HOSPITAL_COMMUNITY): Payer: Medicare HMO

## 2018-11-11 ENCOUNTER — Encounter: Payer: Self-pay | Admitting: Family Medicine

## 2018-11-11 ENCOUNTER — Other Ambulatory Visit: Payer: Self-pay

## 2018-11-11 ENCOUNTER — Ambulatory Visit (INDEPENDENT_AMBULATORY_CARE_PROVIDER_SITE_OTHER): Payer: Medicare HMO | Admitting: Family Medicine

## 2018-11-11 VITALS — BP 140/74 | HR 50 | Temp 98.1°F | Resp 15 | Ht 63.0 in | Wt 118.1 lb

## 2018-11-11 DIAGNOSIS — R69 Illness, unspecified: Secondary | ICD-10-CM | POA: Diagnosis not present

## 2018-11-11 DIAGNOSIS — F17218 Nicotine dependence, cigarettes, with other nicotine-induced disorders: Secondary | ICD-10-CM | POA: Diagnosis not present

## 2018-11-11 DIAGNOSIS — F419 Anxiety disorder, unspecified: Secondary | ICD-10-CM | POA: Diagnosis not present

## 2018-11-11 DIAGNOSIS — Z0000 Encounter for general adult medical examination without abnormal findings: Secondary | ICD-10-CM

## 2018-11-11 MED ORDER — CLONAZEPAM 0.5 MG PO TABS: 0.5000 mg | ORAL_TABLET | Freq: Every day | ORAL | 2 refills | Status: DC

## 2018-11-11 NOTE — Patient Instructions (Signed)
Wellness due in November, please schedule  MD follow up in January, call if you need me before  Need to STOP smoking, even 3 per day is too many!  Doing very well overall, keep it up!  No changes in your medications  Social distancing. Frequent hand washing with soap and water Keeping your hands off of your face. These 3 practices will help to keep both you and your community healthy during this time. Please practice them faithfully!   Thanks for choosing [facility], we consider it a privelige to serve you.

## 2018-11-12 ENCOUNTER — Encounter: Payer: Self-pay | Admitting: Family Medicine

## 2018-11-12 NOTE — Assessment & Plan Note (Signed)
Annual exam as documented. Counseling done  re healthy lifestyle involving commitment to 150 minutes exercise per week, heart healthy diet, and attaining healthy weight.The importance of adequate sleep also discussed.  Immunization and cancer screening needs are specifically addressed at this visit.  

## 2018-11-12 NOTE — Assessment & Plan Note (Addendum)
Controlled, no change in medication, prescription sent for next 6 months to mail order

## 2018-11-12 NOTE — Assessment & Plan Note (Addendum)
Asked:confirms currently smokes 3 cigarettes/ day Assess: Unwilling to set a quit date Advise: needs to QUIT to reduce risk of cancer, cardio and cerebrovascular disease Assist: counseled for 5 minutes  Arrange: follow up in 3 to 6  months

## 2018-11-12 NOTE — Progress Notes (Signed)
    [name]     MRN: [id number]      DOB: [birth year]  HPI: Patient is in for annual physical exam. Chronic medication for anxiety is prescribed and anxiety assessed, also nicotine cessation counseling done Recent visit with Oncology reviewed with her son who is encouraged to accompany her to next visit for better understanding as genetic testing is mentioned    PE: BP 140/74   Pulse (!) 50   Temp 98.1 F (36.7 C) (Temporal)   Resp 15   Ht 5\' 3"  (1.6 m)   Wt 118 lb 1.9 oz (53.6 kg)   SpO2 96%   BMI 20.92 kg/m   Pleasant  female, alert and oriented x 3, in no cardio-pulmonary distress. Afebrile. HEENT No facial trauma or asymetry. Sinuses non tender.  Extra occullar muscles intact, . External ears normal Oropharynx moist, no exudate. Neck: supple, no adenopathy,JVD or thyromegaly.No bruits.  Chest: Clear to ascultation bilaterally.No crackles or wheezes. Non tender to palpation  Breast: Not examined  Cardiovascular system; Heart sounds normal,  S1 and  S2 ,no S3.  No murmur, or thrill. Apical beat not displaced Peripheral pulses normal.  Abdomen: Soft, non tender, Bowel sounds normal. No guarding, tenderness or rebound.  Rectal:  Not indicated     Musculoskeletal exam: Full though reduced  ROM of spine, hips , shoulders and knees. Mild  deformity ,swelling and  crepitus noted in knees No muscle wasting or atrophy.   Neurologic: Cranial nerves 2 to 12 intact. Power, tone ,sensation  normal throughout.  disturbance in gait. mild  tremor.  Skin: Intact, no ulceration, erythema , scaling or rash noted. Pigmentation normal throughout  Psych; Normal mood and affect. Judgement and concentration normal   Assessment & Plan:  Annual physical exam Annual exam as documented. Counseling done  re healthy lifestyle involving commitment to 150 minutes exercise per week, heart healthy diet, and attaining healthy weight.The importance of adequate sleep  also discussed.  Immunization and cancer screening needs are specifically addressed at this visit.   Anxiety Controlled, no change in medication, prescription sent for next 6 months to mail order  Nicotine dependence Asked:confirms currently smokes 3 cigarettes/ day Assess: Unwilling to set a quit date Advise: needs to QUIT to reduce risk of cancer, cardio and cerebrovascular disease Assist: counseled for 5 minutes  Arrange: follow up in 3 to 6  months

## 2018-11-17 ENCOUNTER — Inpatient Hospital Stay (HOSPITAL_COMMUNITY): Payer: Medicare HMO

## 2018-11-17 ENCOUNTER — Inpatient Hospital Stay (HOSPITAL_COMMUNITY): Payer: Medicare HMO | Attending: Hematology

## 2018-11-17 ENCOUNTER — Inpatient Hospital Stay (HOSPITAL_BASED_OUTPATIENT_CLINIC_OR_DEPARTMENT_OTHER): Payer: Medicare HMO | Admitting: Hematology

## 2018-11-17 ENCOUNTER — Encounter (HOSPITAL_COMMUNITY): Payer: Self-pay | Admitting: Hematology

## 2018-11-17 ENCOUNTER — Other Ambulatory Visit: Payer: Self-pay

## 2018-11-17 VITALS — BP 155/51 | HR 55 | Temp 96.8°F | Resp 16

## 2018-11-17 DIAGNOSIS — C252 Malignant neoplasm of tail of pancreas: Secondary | ICD-10-CM

## 2018-11-17 DIAGNOSIS — Z5111 Encounter for antineoplastic chemotherapy: Secondary | ICD-10-CM | POA: Insufficient documentation

## 2018-11-17 DIAGNOSIS — Z79899 Other long term (current) drug therapy: Secondary | ICD-10-CM | POA: Diagnosis not present

## 2018-11-17 DIAGNOSIS — F17210 Nicotine dependence, cigarettes, uncomplicated: Secondary | ICD-10-CM | POA: Diagnosis not present

## 2018-11-17 DIAGNOSIS — C787 Secondary malignant neoplasm of liver and intrahepatic bile duct: Secondary | ICD-10-CM | POA: Insufficient documentation

## 2018-11-17 DIAGNOSIS — R634 Abnormal weight loss: Secondary | ICD-10-CM

## 2018-11-17 DIAGNOSIS — C259 Malignant neoplasm of pancreas, unspecified: Secondary | ICD-10-CM | POA: Diagnosis not present

## 2018-11-17 DIAGNOSIS — Z5189 Encounter for other specified aftercare: Secondary | ICD-10-CM | POA: Diagnosis not present

## 2018-11-17 DIAGNOSIS — R69 Illness, unspecified: Secondary | ICD-10-CM | POA: Diagnosis not present

## 2018-11-17 LAB — CBC WITH DIFFERENTIAL/PLATELET
Abs Immature Granulocytes: 0.02 10*3/uL (ref 0.00–0.07)
Basophils Absolute: 0 10*3/uL (ref 0.0–0.1)
Basophils Relative: 0 %
Eosinophils Absolute: 0.2 10*3/uL (ref 0.0–0.5)
Eosinophils Relative: 3 %
HCT: 31.6 % — ABNORMAL LOW (ref 36.0–46.0)
Hemoglobin: 10.4 g/dL — ABNORMAL LOW (ref 12.0–15.0)
Immature Granulocytes: 0 %
Lymphocytes Relative: 15 %
Lymphs Abs: 0.9 10*3/uL (ref 0.7–4.0)
MCH: 30.9 pg (ref 26.0–34.0)
MCHC: 32.9 g/dL (ref 30.0–36.0)
MCV: 93.8 fL (ref 80.0–100.0)
Monocytes Absolute: 0.5 10*3/uL (ref 0.1–1.0)
Monocytes Relative: 8 %
Neutro Abs: 4.5 10*3/uL (ref 1.7–7.7)
Neutrophils Relative %: 74 %
Platelets: 138 10*3/uL — ABNORMAL LOW (ref 150–400)
RBC: 3.37 MIL/uL — ABNORMAL LOW (ref 3.87–5.11)
RDW: 17.3 % — ABNORMAL HIGH (ref 11.5–15.5)
WBC: 6.2 10*3/uL (ref 4.0–10.5)
nRBC: 0 % (ref 0.0–0.2)

## 2018-11-17 LAB — COMPREHENSIVE METABOLIC PANEL
ALT: 14 U/L (ref 0–44)
AST: 25 U/L (ref 15–41)
Albumin: 3.8 g/dL (ref 3.5–5.0)
Alkaline Phosphatase: 147 U/L — ABNORMAL HIGH (ref 38–126)
Anion gap: 10 (ref 5–15)
BUN: 35 mg/dL — ABNORMAL HIGH (ref 8–23)
CO2: 23 mmol/L (ref 22–32)
Calcium: 8.7 mg/dL — ABNORMAL LOW (ref 8.9–10.3)
Chloride: 105 mmol/L (ref 98–111)
Creatinine, Ser: 1.32 mg/dL — ABNORMAL HIGH (ref 0.44–1.00)
GFR calc Af Amer: 42 mL/min — ABNORMAL LOW (ref >60)
GFR calc non Af Amer: 36 mL/min — ABNORMAL LOW (ref >60)
Glucose, Bld: 100 mg/dL — ABNORMAL HIGH (ref 70–99)
Potassium: 4.3 mmol/L (ref 3.5–5.1)
Sodium: 138 mmol/L (ref 135–145)
Total Bilirubin: 0.8 mg/dL (ref 0.3–1.2)
Total Protein: 7.2 g/dL (ref 6.5–8.1)

## 2018-11-17 MED ORDER — DEXTROSE 5 % IV SOLN
Freq: Once | INTRAVENOUS | Status: AC
  Administered 2018-11-17: 10:00:00 via INTRAVENOUS

## 2018-11-17 MED ORDER — SODIUM CHLORIDE 0.9 % IV SOLN
1920.0000 mg/m2 | INTRAVENOUS | Status: DC
  Administered 2018-11-17: 3000 mg via INTRAVENOUS
  Filled 2018-11-17: qty 60

## 2018-11-17 MED ORDER — LEUCOVORIN CALCIUM INJECTION 350 MG
400.0000 mg/m2 | Freq: Once | INTRAVENOUS | Status: AC
  Administered 2018-11-17: 628 mg via INTRAVENOUS
  Filled 2018-11-17: qty 31.4

## 2018-11-17 MED ORDER — SODIUM CHLORIDE 0.9 % IV SOLN
10.0000 mg | Freq: Once | INTRAVENOUS | Status: AC
  Administered 2018-11-17: 10 mg via INTRAVENOUS
  Filled 2018-11-17: qty 10

## 2018-11-17 MED ORDER — FLUOROURACIL CHEMO INJECTION 500 MG/10ML
320.0000 mg/m2 | Freq: Once | INTRAVENOUS | Status: AC
  Administered 2018-11-17: 500 mg via INTRAVENOUS
  Filled 2018-11-17: qty 10

## 2018-11-17 MED ORDER — PALONOSETRON HCL INJECTION 0.25 MG/5ML
0.2500 mg | Freq: Once | INTRAVENOUS | Status: AC
  Administered 2018-11-17: 0.25 mg via INTRAVENOUS
  Filled 2018-11-17: qty 5

## 2018-11-17 MED ORDER — OXALIPLATIN CHEMO INJECTION 100 MG/20ML
65.0000 mg/m2 | Freq: Once | INTRAVENOUS | Status: AC
  Administered 2018-11-17: 100 mg via INTRAVENOUS
  Filled 2018-11-17: qty 20

## 2018-11-17 MED ORDER — SODIUM CHLORIDE 0.9% FLUSH
10.0000 mL | INTRAVENOUS | Status: DC | PRN
  Administered 2018-11-17: 10 mL
  Filled 2018-11-17: qty 10

## 2018-11-17 NOTE — Assessment & Plan Note (Signed)
1.  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 28 days from 01/28/2017 until 10/07/2017 with progression. - 21 cycles of infusional 5-FU and Onivyde from 10/21/2017 through 09/08/2018 with progression.    -CT CAP on 09/20/2018 shows a hypodense lesion in the segment 2, measuring 2 cm, previously 1.2 cm.  2.6 cm hypodense lesion in segment 7 previously measured 2.1 cm. -CA 19-9 has gone up to 171 on 08/25/2018 from 104 prior to that.  - 3 cycles of FOLFOX (20% dose reduced) from 09/22/2018 through 10/25/2018. -She is not having any problems since we added Neulasta with cycle 2. - We reviewed her blood work.  She does not require any dose modifications. -She may proceed with cycle 4 today.  We will see her back in 2 weeks for follow-up.  I plan to repeat scans after cycle 6.  2.  Weight loss: -He gained 1 to 2 pounds in the last 3 weeks. -He is drinking Ensure 2 cans/day.  She will continue Marinol twice daily.  3.  Genetic testing: -We will make a referral for genetic testing.  We will also consider MSI testing in the future.  However it requires repeat biopsy.

## 2018-11-17 NOTE — Progress Notes (Signed)
[place], [place] [address]   CLINIC:  Medical Oncology/Hematology  PCP:  Fayrene Helper, MD [address], [place] [address] [phone]   REASON FOR VISIT:  Follow-up for metastatic pancreatic cancer to the liver   BRIEF ONCOLOGIC HISTORY:  Oncology History  Pancreatic cancer metastasized to liver ([place])  03/10/2014 Initial Diagnosis   Pancreatic cancer metastasized to liver   03/22/2014 - 03/05/2016 Chemotherapy   Abraxane/Gemzar days 1, 8, every 28 days.  Day 15 was cancelled due to leukopenia and thrombocytopenia on day 15 cycle 1.   05/24/2014 Treatment Plan Change   Day 8 of cycle 3 is held with ANC of 1.1   06/05/2014 Imaging   CT C/A/P . Interval decrease in size of the pancreatic tail mass.Improved hepatic metastatic disease. No new lesions. No CT findings for metastatic disease involving the chest.   08/09/2014 Tumor Marker   CA 19-9= 33 (WNL)   10/26/2014 Imaging   MRI- Continued interval decrease in size of the hepatic metastatic lesions and no new lesions are identified. Continued decrease in size of the pancreatic tail lesion.   01/03/2015 Tumor Marker   Results for [name] (MRN [id number]) as of 01/18/2015 12:52  01/03/2015 10:00 CA 19-9: 14    01/17/2015 Imaging   MRI- Response to therapy of hepatic metastasis.  Similar size of a pancreatic tail lesion.   03/20/2015 Imaging   MRI-L spine- Severe disc space narrowing at L2-L3, with endplate reactive changes. Large disc extrusion into the ventral epidural space,central to the RIGHT with a cephalad migrated free fragment. Additional Large disc extrusion into the retroperitone...   03/22/2015 Imaging   CT pelvis- No evidence for metastatic disease within the pelvis.   07/24/2015 Imaging   MRI abd- Continued response to therapy, with no residual detectable liver metastases. No new sites of metastatic disease in the abdomen.    08/15/2015 Code Status     She confirms desire for DNR status.   12/21/2015 Imaging   MRI liver- Severe image degradation due to motion artifact reducing diagnostic sensitivity and specificity. Reduce conspicuity of the pancreatic tail lesion suggesting further improvement. The original liver lesions have resolved.   03/05/2016 Treatment Plan Change   Chemotherapy holiday/Break after 2 years worth of treatment   04/07/2016 Imaging   CT chest- When compared to recent chest CT, new minimally displaced anterior left sixth rib fracture. Slight increase in subcarinal adenopathy   04/28/2016 Imaging   Bone density- BMD as determined from Femur Neck Right is 0.705 g/cm2 with a T-Score of -2.4. This patient is considered osteopenic according to [facility]) criteria. Compared with the prior study on 02/23/2013, the BMD of the lumbar spine/rt. femoral neck show a statistically significant decrease.    05/23/2016 Imaging   MRI abd- 1. Exam is significantly degraded by patient respiratory motion. Consider follow-up exams with CT abdomen with without contrast per pancreatic protocol. 2. Fullness in tail of pancreas is more prominent and potentially increased in size. Recommend close attention on follow-up. (Consider CT as above) 3. No explanation for back pain.   09/11/2016 Imaging   MRI pancreas- Interval progression of pancreatic tail lesion. New differential perfusion right liver with areas of heterogeneity. Underlying metastatic disease in this region not excluded.   09/11/2016 Progression   MRi in conjunction with rising CA 19-9 are indicative of relapse of disease.   01/09/2017 Progression   CT C/A/P: 2.1 x 2.9  cm lesion in the pancreatic tail and abutting the splenic hilum, corresponding to known primary pancreatic neoplasm, mildly increased.  Scattered small hypoenhancing lesions in the liver measuring up to 10 mm, approximately 8-10 in number, suspicious for hepatic metastases.  No  findings specific for metastatic disease in the chest.  Additional ancillary findings as above.    01/27/2017 - 10/20/2017 Chemotherapy   The patient had gemcitabine (GEMZAR) 1,292 mg in sodium chloride 0.9 % 250 mL chemo infusion, 800 mg/m2 = 1,292 mg, Intravenous,  Once, 9 of 12 cycles Administration: 1,292 mg (01/28/2017), 1,292 mg (02/11/2017), 1,292 mg (02/25/2017), 1,292 mg (03/11/2017), 1,292 mg (03/25/2017), 1,292 mg (04/08/2017), 1,292 mg (04/22/2017), 1,292 mg (04/29/2017), 1,292 mg (05/20/2017), 1,292 mg (06/03/2017), 1,292 mg (06/24/2017), 1,292 mg (07/08/2017), 1,292 mg (07/29/2017), 1,292 mg (08/12/2017), 1,292 mg (08/26/2017), 1,292 mg (09/09/2017), 1,292 mg (09/23/2017), 1,292 mg (10/07/2017)  for chemotherapy treatment.    10/21/2017 - 09/22/2018 Chemotherapy   The patient had leucovorin 700 mg in dextrose 5 % 250 mL infusion, 644 mg, Intravenous,  Once, 22 of 22 cycles Administration: 700 mg (10/21/2017), 700 mg (11/04/2017), 700 mg (11/18/2017), 700 mg (12/02/2017), 700 mg (12/16/2017), 700 mg (12/30/2017), 700 mg (01/13/2018), 700 mg (01/27/2018), 700 mg (02/10/2018), 700 mg (02/24/2018), 700 mg (03/10/2018), 700 mg (03/24/2018), 700 mg (04/28/2018), 700 mg (05/17/2018), 700 mg (05/31/2018), 700 mg (06/16/2018), 700 mg (06/30/2018), 700 mg (07/21/2018), 700 mg (08/04/2018), 700 mg (08/25/2018), 700 mg (09/08/2018) ondansetron (ZOFRAN) 8 mg in sodium chloride 0.9 % 50 mL IVPB, , Intravenous,  Once, 21 of 21 cycles Administration:  (11/04/2017),  (01/27/2018),  (02/10/2018),  (02/24/2018),  (03/10/2018),  (03/24/2018),  (04/28/2018),  (05/17/2018),  (05/31/2018),  (06/16/2018),  (06/30/2018),  (07/21/2018),  (08/04/2018),  (08/25/2018),  (09/08/2018) fluorouracil (ADRUCIL) 3,850 mg in sodium chloride 0.9 % 73 mL chemo infusion, 2,400 mg/m2 = 3,850 mg, Intravenous, 1 Day/Dose, 22 of 22 cycles Dose modification: 1,920 mg/m2 (original dose 2,400 mg/m2, Cycle 2, Reason: Provider Judgment, Comment: mucisitis) Administration: 3,850 mg  (10/21/2017), 3,100 mg (11/04/2017), 3,100 mg (11/18/2017), 3,100 mg (12/02/2017), 3,100 mg (12/16/2017), 3,100 mg (12/30/2017), 3,100 mg (01/13/2018), 3,100 mg (01/27/2018), 3,100 mg (02/10/2018), 3,100 mg (02/24/2018), 3,100 mg (03/10/2018), 3,100 mg (03/24/2018), 3,100 mg (04/28/2018), 3,100 mg (05/17/2018), 3,100 mg (05/31/2018), 3,100 mg (06/16/2018), 3,100 mg (06/30/2018), 3,100 mg (07/21/2018), 3,100 mg (08/04/2018), 3,100 mg (08/25/2018), 3,100 mg (09/08/2018) irinotecan LIPOSOME (ONIVYDE) 81.7 mg in sodium chloride 0.9 % 500 mL chemo infusion, 50 mg/m2 = 81.7 mg (100 % of original dose 50 mg/m2), Intravenous, Once, 22 of 22 cycles Dose modification: 50 mg/m2 (original dose 50 mg/m2, Cycle 1, Reason: Provider Judgment), 50 mg/m2 (original dose 50 mg/m2, Cycle 2, Reason: Provider Judgment) Administration: 81.7 mg (10/21/2017), 81.7 mg (11/04/2017), 81.7 mg (11/18/2017), 81.7 mg (12/02/2017), 81.7 mg (12/16/2017), 81.7 mg (12/30/2017), 81.7 mg (01/13/2018), 81.7 mg (01/27/2018), 81.7 mg (02/10/2018), 81.7 mg (02/24/2018), 81.7 mg (03/10/2018), 81.7 mg (03/24/2018), 81.7 mg (04/28/2018), 81.7 mg (05/17/2018), 81.7 mg (05/31/2018), 81.7 mg (06/16/2018), 81.7 mg (06/30/2018), 81.7 mg (07/21/2018), 81.7 mg (08/04/2018), 81.7 mg (08/25/2018), 81.7 mg (09/08/2018)  for chemotherapy treatment.    09/22/2018 -  Chemotherapy   The patient had palonosetron (ALOXI) injection 0.25 mg, 0.25 mg, Intravenous,  Once, 4 of 6 cycles Administration: 0.25 mg (09/22/2018), 0.25 mg (10/11/2018), 0.25 mg (10/25/2018), 0.25 mg (11/17/2018) pegfilgrastim (NEULASTA) injection 6 mg, 6 mg, Subcutaneous, Once, 3 of 5 cycles Administration: 6 mg (10/13/2018), 6 mg (10/27/2018) leucovorin 628 mg in dextrose 5 % 250 mL infusion, 400 mg/m2 =  628 mg, Intravenous,  Once, 4 of 6 cycles Administration: 628 mg (09/22/2018), 628 mg (10/11/2018), 628 mg (10/25/2018), 628 mg (11/17/2018) oxaliplatin (ELOXATIN) 100 mg in dextrose 5 % 500 mL chemo infusion, 65 mg/m2 = 100 mg (100 % of original  dose 65 mg/m2), Intravenous,  Once, 4 of 6 cycles Dose modification: 65 mg/m2 (original dose 65 mg/m2, Cycle 1, Reason: Provider Judgment) Administration: 100 mg (09/22/2018), 100 mg (10/11/2018), 100 mg (10/25/2018), 100 mg (11/17/2018) fluorouracil (ADRUCIL) chemo injection 500 mg, 320 mg/m2 = 500 mg (100 % of original dose 320 mg/m2), Intravenous,  Once, 4 of 6 cycles Dose modification: 320 mg/m2 (original dose 320 mg/m2, Cycle 1, Reason: Provider Judgment) Administration: 500 mg (09/22/2018), 500 mg (10/11/2018), 500 mg (10/25/2018), 500 mg (11/17/2018) fluorouracil (ADRUCIL) 3,000 mg in sodium chloride 0.9 % 90 mL chemo infusion, 1,920 mg/m2 = 3,000 mg (100 % of original dose 1,920 mg/m2), Intravenous, 1 Day/Dose, 4 of 6 cycles Dose modification: 1,920 mg/m2 (original dose 1,920 mg/m2, Cycle 1, Reason: Provider Judgment) Administration: 3,000 mg (09/22/2018), 3,000 mg (10/11/2018), 3,000 mg (10/25/2018), 3,000 mg (11/17/2018)  for chemotherapy treatment.       CANCER STAGING: Cancer Staging Pancreatic cancer metastasized to liver [facility]) Staging form: Pancreas, AJCC 7th Edition - Clinical: Stage IV (T3, N1, M1) - Signed by Baird Cancer, PA-C on 03/16/2014 - Pathologic: No stage assigned - Unsigned    INTERVAL HISTORY:  [name] 85 y.o. female is seen for follow-up of pancreatic cancer.  She has received cycle 3 on 10/25/2018.  She is here for cycle 4.  She complains of swelling of her legs at bedtime.  She does not have any swelling when she wakes up in the morning and during the daytime.  Diarrhea is not a major problem.  She has constant numbness in the bottom of the feet which is also stable.  She had the numbness even prior to start of therapy.  Denies any fevers or infections.  Denies any nausea or vomiting.  Appetite and energy levels are 50%.   REVIEW OF SYSTEMS:  Review of Systems  Constitutional: Negative for fatigue.  Cardiovascular: Negative for leg swelling.  Neurological: Positive  for numbness.  All other systems reviewed and are negative.    PAST MEDICAL/SURGICAL HISTORY:  Past Medical History:  Diagnosis Date  . Anemia due to antineoplastic chemotherapy 09/12/2015   Started Aranesp 500 mcg on 09/12/2015  . Anxiety   . Chronic diarrhea   . Depression   . DNR (do not resuscitate) 08/16/2015  . Erosive esophagitis   . GERD (gastroesophageal reflux disease)   . Hx of adenomatous colonic polyps    tubular adenomas, last found in 2008  . Hyperplastic colon polyp 03/19/10   tcs by Dr. Gala Romney  . Hypertension 20 years   . Kidney stone    hx/ crushed   . Opioid contract exists 04/18/2015   With Dr. Moshe Cipro  . Pancreatic cancer ([place]) 02/2014  . Pancreatic cancer metastasized to liver ([place]) 03/10/2014  . Schatzki's ring 08/26/10   Last dilated on EGD by Dr. Trevor Iha [facility], linear gastric erosions, BI hemigastrectomy   Past Surgical History:  Procedure Laterality Date  . BREAST LUMPECTOMY Left   . bunion removal     from both feet   . CHOLECYSTECTOMY  1965   . COLONOSCOPY  03/19/2010   DR Gala Romney,, normal TI, pancolonic diverticula, random colon bx neg., hyperplastic polyps removed  . ESOPHAGOGASTRODUODENOSCOPY  11/22/2003   DR Gala Romney, erosive RE,  Billroth I  . ESOPHAGOGASTRODUODENOSCOPY  08/26/10   Dr. Gala Romney- moderate severe ERE, Scahtzki ring s/p dilation, Billroth I, linear gastric erosions, bx-gastric xanthelasma  . LEFT SHOULDER SURGERY  2009   DR HARRISON  . ORIF ANKLE FRACTURE Right 05/22/2013   Procedure: OPEN REDUCTION INTERNAL FIXATION (ORIF) RIGHT ANKLE FRACTURE;  Surgeon: Sanjuana Kava, MD;  Location: [facility];  Service: Orthopedics;  Laterality: Right;  . stomach ulcer  50 years ago    had some of her stomach removed      SOCIAL HISTORY:  Social History   Socioeconomic History  . Marital status: Widowed    Spouse name: Not on file  . Number of children: 2  . Years of education: 15  . Highest education level: 12th grade  Occupational History  .  Occupation: retired from Estate manager/land agent: RETIRED  Social Needs  . Financial resource strain: Not hard at all  . Food insecurity    Worry: Never true    Inability: Never true  . Transportation needs    Medical: No    Non-medical: No  Tobacco Use  . Smoking status: Current Some Day Smoker    Packs/day: 0.50    Years: 60.00    Pack years: 30.00    Types: Cigarettes    Last attempt to quit: 09/09/2016    Years since quitting: 2.1  . Smokeless tobacco: Never Used  Substance and Sexual Activity  . Alcohol use: No  . Drug use: No  . Sexual activity: Not Currently  Lifestyle  . Physical activity    Days per week: 3 days    Minutes per session: 30 min  . Stress: Not at all  Relationships  . Social connections    Talks on phone: More than three times a week    Gets together: Three times a week    Attends religious service: 1 to 4 times per year    Active member of club or organization: No    Attends meetings of clubs or organizations: Never    Relationship status: Widowed  . Intimate partner violence    Fear of current or ex partner: No    Emotionally abused: No    Physically abused: No    Forced sexual activity: No  Other Topics Concern  . Not on file  Social History Narrative   Lives with son alone     FAMILY HISTORY:  Family History  Problem Relation Age of Onset  . Hypertension Mother   . Kidney failure Brother        on dialysis  . Diabetes Sister   . Diabetes Sister   . Hypertension Father   . Kidney failure Sister   . Kidney Stones Brother   . Breast cancer Son   . Gout Son   . Liver disease Neg Hx   . Colon cancer Neg Hx     CURRENT MEDICATIONS:  Outpatient Encounter Medications as of 11/17/2018  Medication Sig  . acetaminophen (TYLENOL) 500 MG tablet Take 500 mg by mouth every 6 (six) hours as needed for mild pain or moderate pain.   Marland Kitchen amLODipine (NORVASC) 10 MG tablet Take 1 tablet (10 mg total) by mouth daily.  . clobetasol (TEMOVATE) 0.05 %  external solution APP AA ON SCALP BID PRN NOT TO FACE GROIN OR UNDERARMS  . clonazePAM (KLONOPIN) 0.5 MG tablet Take 1 tablet (0.5 mg total) by mouth at bedtime.  . cycloSPORINE (RESTASIS) 0.05 % ophthalmic emulsion Place 1 drop  into both eyes 2 (two) times daily.  . diphenhydrAMINE (BENADRYL) 50 MG tablet Take 1 tablet 1 hour prior to CT  . fluorometholone (FML) 0.1 % ophthalmic suspension INSTILL 1 DROP INTO EACH EYE THREE TIMES DAILY FOR 14 DAYS  . fluticasone (CUTIVATE) 0.05 % cream APPLY AA FACE BID PRN  . fluticasone (FLONASE) 50 MCG/ACT nasal spray Place 1 spray into both nostrils daily.  Marland Kitchen lidocaine (XYLOCAINE) 2 % solution RINSE WITH 5ML AS NEEDED TO RELIEVE MOUTH PAIN.  Marland Kitchen lidocaine-prilocaine (EMLA) cream Apply 1 application topically as needed. Apply to portacath site as needed  . metoprolol tartrate (LOPRESSOR) 25 MG tablet Take one tablet once daily for blood pressure  . MYRBETRIQ 50 MG TB24 tablet TAKE 1 TABLET DAILY  . pantoprazole (PROTONIX) 20 MG tablet Take 1 tablet by mouth daily  . [DISCONTINUED] prochlorperazine (COMPAZINE) 10 MG tablet Take 1 tablet (10 mg total) by mouth every 6 (six) hours as needed (Nausea or vomiting).   No facility-administered encounter medications on file as of 11/17/2018.     ALLERGIES:  Allergies  Allergen Reactions  . Iohexol Hives and Shortness Of Breath    **Patient can not have IV contrast, pt has breakthrough reaction even after 13 hour premeds** Do not give IV contrast PATIENT HAD TO BE TAKEN TO ED, C/O WHELPS, HIVES, DIFFICULTY BREATHING. PATIENT GIVEN IV CONTRAST AFTER 13 HOUR PRE MEDS ON 01/09/2017 WITH NO REACTION NOTED. On 10/19/17 patient had reaction despite premedication.    Marland Kitchen Aciphex [Rabeprazole Sodium] Other (See Comments)    unknown  . Amlodipine Besylate-Valsartan     Rash to high-dose (5/320)  . Esomeprazole Magnesium   . Omeprazole Other (See Comments)    Patient states "medication didn't work"  . Ciprofloxacin Rash  .  Penicillins Swelling and Rash    Has patient had a PCN reaction causing immediate rash, facial/tongue/throat swelling, SOB or lightheadedness with hypotension: Yes Has patient had a PCN reaction causing severe rash involving mucus membranes or skin necrosis: Yes Has patient had a PCN reaction that required hospitalization: No Has patient had a PCN reaction occurring within the last 10 years: yes If all of the above answers are "NO", then may proceed with Cephalosporin use.   [name] [Benzonatate] Itching    Full head, face, neck, and chest itching.      PHYSICAL EXAM:  ECOG Performance status: 1  Vitals:   11/17/18 0838  BP: (!) 153/53  Pulse: (!) 50  Resp: 16  Temp: 97.9 F (36.6 C)  SpO2: 97%   Filed Weights   11/17/18 0838  Weight: 120 lb 1.6 oz (54.5 kg)    Physical Exam Vitals signs reviewed.  Constitutional:      Appearance: Normal appearance.  Cardiovascular:     Rate and Rhythm: Normal rate and regular rhythm.     Heart sounds: Normal heart sounds.  Pulmonary:     Effort: Pulmonary effort is normal.     Breath sounds: Normal breath sounds.  Abdominal:     General: There is no distension.     Palpations: Abdomen is soft. There is no mass.  Musculoskeletal:        General: No swelling.  Skin:    General: Skin is warm.  Neurological:     General: No focal deficit present.     Mental Status: She is alert and oriented to person, place, and time.  Psychiatric:        Mood and Affect: Mood normal.  Behavior: Behavior normal.      LABORATORY DATA:  I have reviewed the labs as listed.  CBC    Component Value Date/Time   WBC 6.2 11/17/2018 0822   RBC 3.37 (L) 11/17/2018 0822   HGB 10.4 (L) 11/17/2018 0822   HCT 31.6 (L) 11/17/2018 0822   PLT 138 (L) 11/17/2018 0822   MCV 93.8 11/17/2018 0822   MCH 30.9 11/17/2018 0822   MCHC 32.9 11/17/2018 0822   RDW 17.3 (H) 11/17/2018 0822   LYMPHSABS 0.9 11/17/2018 0822   MONOABS 0.5 11/17/2018  0822   EOSABS 0.2 11/17/2018 0822   BASOSABS 0.0 11/17/2018 0822   CMP Latest Ref Rng & Units 11/17/2018 10/25/2018 10/11/2018  Glucose 70 - 99 mg/dL 100(H) 104(H) 110(H)  BUN 8 - 23 mg/dL 35(H) 28(H) 28(H)  Creatinine 0.44 - 1.00 mg/dL 1.32(H) 1.47(H) 1.44(H)  Sodium 135 - 145 mmol/L 138 141 140  Potassium 3.5 - 5.1 mmol/L 4.3 4.2 4.2  Chloride 98 - 111 mmol/L 105 106 104  CO2 22 - 32 mmol/L '23 22 24  '$ Calcium 8.9 - 10.3 mg/dL 8.7(L) 8.8(L) 8.6(L)  Total Protein 6.5 - 8.1 g/dL 7.2 7.1 6.6  Total Bilirubin 0.3 - 1.2 mg/dL 0.8 0.6 0.8  Alkaline Phos 38 - 126 U/L 147(H) 170(H) 135(H)  AST 15 - 41 U/L '25 18 19  '$ ALT 0 - 44 U/L '14 13 13       '$ DIAGNOSTIC IMAGING:  I have independently reviewed the scans and discussed with the patient.   I have reviewed [name] Lick LPN's note and agree with the documentation.  I personally performed a face-to-face visit, made revisions and my assessment and plan is as follows.    ASSESSMENT & PLAN:   Pancreatic cancer metastasized to liver ([place]) 1.  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 28 days from 01/28/2017 until 10/07/2017 with progression. - 21 cycles of infusional 5-FU and Onivyde from 10/21/2017 through 09/08/2018 with progression.    -CT CAP on 09/20/2018 shows a hypodense lesion in the segment 2, measuring 2 cm, previously 1.2 cm.  2.6 cm hypodense lesion in segment 7 previously measured 2.1 cm. -CA 19-9 has gone up to 171 on 08/25/2018 from 104 prior to that.  - 3 cycles of FOLFOX (20% dose reduced) from 09/22/2018 through 10/25/2018. -She is not having any problems since we added Neulasta with cycle 2. - We reviewed her blood work.  She does not require any dose modifications. -She may proceed with cycle 4  today.  We will see her back in 2 weeks for follow-up.  I plan to repeat scans after cycle 6.  2.  Weight loss: -He gained 1 to 2 pounds in the last 3 weeks. -He is drinking Ensure 2 cans/day.  She will continue Marinol twice daily.  3.  Genetic testing: -We will make a referral for genetic testing.  We will also consider MSI testing in the future.  However it requires repeat biopsy.    Total time spent is 25 minutes with more than 50% of time spent face-to-face discussing treatment plan and coordination of care.    Orders placed this encounter:  No orders of the defined types were placed in this encounter.     Derek Jack, MD  [place] [phone]

## 2018-11-17 NOTE — Progress Notes (Signed)
Pt presents today for treatment and appointment with Dr. Delton Coombes. VS within parameters for treatment. MAR reviewed. Pt has no complaints of any changes since the last visit. Pt has complaints of swelling in her knee that hasn't changed since the last visit.   Treatment given today per MD orders. Tolerated infusion without adverse affects. Vital signs stable. No complaints at this time. Discharged from clinic ambulatory. 5FU pump infusion per protocol. RUN noted on pump. F/U with [facility] as scheduled.

## 2018-11-17 NOTE — Patient Instructions (Addendum)
[facility] Discharge Instructions  You were seen today by Dr. Katragadda. He went over your recent lab results. He will see you back in 2 weeks for labs and follow up.   Thank you for choosing [facility] to provide your oncology and hematology care.  To afford each patient quality time with our provider, please arrive at least 15 minutes before your scheduled appointment time.   If you have a lab appointment with the Cancer Center please come in thru the  Main Entrance and check in at the main information desk  You need to re-schedule your appointment should you arrive 10 or more minutes late.  We strive to give you quality time with our providers, and arriving late affects you and other patients whose appointments are after yours.  Also, if you no show three or more times for appointments you may be dismissed from the clinic at the providers discretion.     Again, thank you for choosing [facility].  Our hope is that these requests will decrease the amount of time that you wait before being seen by our physicians.       _____________________________________________________________  Should you have questions after your visit to [facility], please contact our office at [phone] between the hours of 8:00 a.m. and 4:30 p.m.  Voicemails left after 4:00 p.m. will not be returned until the following business day.  For prescription refill requests, have your pharmacy contact our office and allow 72 hours.    Cancer Center Support Programs:   > Cancer Support Group  2nd Tuesday of the month 1pm-2pm, Journey Room    

## 2018-11-18 LAB — CANCER ANTIGEN 19-9: CA 19-9: 270 U/mL — ABNORMAL HIGH (ref 0–35)

## 2018-11-19 ENCOUNTER — Inpatient Hospital Stay (HOSPITAL_COMMUNITY): Payer: Medicare HMO

## 2018-11-19 ENCOUNTER — Encounter (HOSPITAL_COMMUNITY): Payer: Self-pay

## 2018-11-19 ENCOUNTER — Other Ambulatory Visit: Payer: Self-pay

## 2018-11-19 VITALS — BP 145/50 | HR 66 | Temp 97.8°F | Resp 16

## 2018-11-19 DIAGNOSIS — C787 Secondary malignant neoplasm of liver and intrahepatic bile duct: Secondary | ICD-10-CM | POA: Diagnosis not present

## 2018-11-19 DIAGNOSIS — Z5111 Encounter for antineoplastic chemotherapy: Secondary | ICD-10-CM | POA: Diagnosis not present

## 2018-11-19 DIAGNOSIS — C259 Malignant neoplasm of pancreas, unspecified: Secondary | ICD-10-CM

## 2018-11-19 DIAGNOSIS — C252 Malignant neoplasm of tail of pancreas: Secondary | ICD-10-CM | POA: Diagnosis not present

## 2018-11-19 DIAGNOSIS — Z5189 Encounter for other specified aftercare: Secondary | ICD-10-CM | POA: Diagnosis not present

## 2018-11-19 MED ORDER — HEPARIN SOD (PORK) LOCK FLUSH 100 UNIT/ML IV SOLN: 500.0000 [IU] | Freq: Once | INTRAVENOUS | Status: DC | PRN

## 2018-11-19 MED ORDER — SODIUM CHLORIDE 0.9% FLUSH
10.0000 mL | INTRAVENOUS | Status: DC | PRN
  Administered 2018-11-19: 10 mL
  Filled 2018-11-19 (×2): qty 10

## 2018-11-19 MED ORDER — PEGFILGRASTIM INJECTION 6 MG/0.6ML ~~LOC~~
6.0000 mg | PREFILLED_SYRINGE | Freq: Once | SUBCUTANEOUS | Status: AC
  Administered 2018-11-19: 6 mg via SUBCUTANEOUS

## 2018-11-19 MED ORDER — SODIUM CHLORIDE 0.9% FLUSH: 10.0000 mL | Freq: Once | INTRAVENOUS | Status: DC

## 2018-11-19 MED ORDER — HEPARIN SOD (PORK) LOCK FLUSH 100 UNIT/ML IV SOLN
500.0000 [IU] | Freq: Once | INTRAVENOUS | Status: AC
  Administered 2018-11-19: 500 [IU] via INTRAVENOUS

## 2018-11-19 NOTE — Progress Notes (Signed)
Patient tolerated pump disconnect with no complaints voiced.  Port site clean and dry.  Good blood return noted.  Band aid applied.  Patient tolerated injection with no complaints voiced.  Site clean and dry with no bruising or swelling noted at site.  Band aid applied.  Vss with discharge and left ambulatory with no s/s of distress noted.

## 2018-11-25 ENCOUNTER — Other Ambulatory Visit: Payer: Self-pay

## 2018-11-25 ENCOUNTER — Inpatient Hospital Stay (HOSPITAL_BASED_OUTPATIENT_CLINIC_OR_DEPARTMENT_OTHER): Payer: Medicare HMO | Admitting: Genetic Counselor

## 2018-11-25 ENCOUNTER — Encounter (HOSPITAL_COMMUNITY): Payer: Self-pay | Admitting: Genetic Counselor

## 2018-11-25 ENCOUNTER — Other Ambulatory Visit (HOSPITAL_COMMUNITY): Payer: Medicare HMO

## 2018-11-25 DIAGNOSIS — C787 Secondary malignant neoplasm of liver and intrahepatic bile duct: Secondary | ICD-10-CM | POA: Diagnosis not present

## 2018-11-25 DIAGNOSIS — C252 Malignant neoplasm of tail of pancreas: Secondary | ICD-10-CM | POA: Diagnosis not present

## 2018-11-25 DIAGNOSIS — Z803 Family history of malignant neoplasm of breast: Secondary | ICD-10-CM

## 2018-11-25 DIAGNOSIS — Z8042 Family history of malignant neoplasm of prostate: Secondary | ICD-10-CM | POA: Insufficient documentation

## 2018-11-25 DIAGNOSIS — C259 Malignant neoplasm of pancreas, unspecified: Secondary | ICD-10-CM

## 2018-11-25 NOTE — Progress Notes (Signed)
REFERRING PROVIDER: Derek Jack, MD [address] [place],  [place] [address]  PRIMARY PROVIDER:  Fayrene Helper, MD  PRIMARY REASON FOR VISIT:  1. Pancreatic cancer metastasized to liver ([place])   2. Family [name] of prostate cancer   3. Family [name] of breast cancer in female      [name] OF PRESENT ILLNESS:   I connected with [name] and her [name] [name] on 11/25/2018 at 9:00 AM EDT by Webex video conference and verified that I am speaking with the correct person using two identifiers.   Patient location: [place] Provider location: Office   [name], a 85 y.o. female, was seen for a [place] cancer genetics consultation at the request of Dr. Delton Coombes due to a personal and family [name] of cancer.  [name] presents to clinic today to discuss the possibility of a hereditary predisposition to cancer, genetic testing, and to further clarify her future cancer risks, as well as potential cancer risks for family members.   In 2015, at the age of 67, [name] was diagnosed with cancer of the pancreas. The treatment plan included chemotherapy.     CANCER [name]:  Oncology [name]  Pancreatic cancer metastasized to liver ([place])  03/10/2014 Initial Diagnosis   Pancreatic cancer metastasized to liver   03/22/2014 - 03/05/2016 Chemotherapy   Abraxane/Gemzar days 1, 8, every 28 days.  Day 15 was cancelled due to leukopenia and thrombocytopenia on day 15 cycle 1.   05/24/2014 Treatment Plan Change   Day 8 of cycle 3 is held with ANC of 1.1   06/05/2014 Imaging   CT C/A/P . Interval decrease in size of the pancreatic tail mass.Improved hepatic metastatic disease. No new lesions. No CT findings for metastatic disease involving the chest.   08/09/2014 Tumor Marker   CA 19-9= 33 (WNL)   10/26/2014 Imaging   MRI- Continued interval decrease in size of the hepatic metastatic lesions and no new lesions are identified. Continued decrease in size of the pancreatic tail  lesion.   01/03/2015 Tumor Marker   Results for [name], [name] (MRN [id number]) as of 01/18/2015 12:52  01/03/2015 10:00 CA 19-9: 14    01/17/2015 Imaging   MRI- Response to therapy of hepatic metastasis.  Similar size of a pancreatic tail lesion.   03/20/2015 Imaging   MRI-L spine- Severe disc space narrowing at L2-L3, with endplate reactive changes. Large disc extrusion into the ventral epidural space,central to the RIGHT with a cephalad migrated free fragment. Additional Large disc extrusion into the retroperitone...   03/22/2015 Imaging   CT pelvis- No evidence for metastatic disease within the pelvis.   07/24/2015 Imaging   MRI abd- Continued response to therapy, with no residual detectable liver metastases. No new sites of metastatic disease in the abdomen.    08/15/2015 Code Status    She confirms desire for DNR status.   12/21/2015 Imaging   MRI liver- Severe image degradation due to motion artifact reducing diagnostic sensitivity and specificity. Reduce conspicuity of the pancreatic tail lesion suggesting further improvement. The original liver lesions have resolved.   03/05/2016 Treatment Plan Change   Chemotherapy holiday/Break after 2 years worth of treatment   04/07/2016 Imaging   CT chest- When compared to recent chest CT, new minimally displaced anterior left sixth rib fracture. Slight increase in subcarinal adenopathy   04/28/2016 Imaging   Bone density- BMD as determined from Femur Neck Right is 0.705 g/cm2 with a T-Score of -2.4. This patient is considered osteopenic  according to [facility]) criteria. Compared with the prior study on 02/23/2013, the BMD of the lumbar spine/rt. femoral neck show a statistically significant decrease.    05/23/2016 Imaging   MRI abd- 1. Exam is significantly degraded by patient respiratory motion. Consider follow-up exams with CT abdomen with without contrast per pancreatic protocol. 2. Fullness in tail of pancreas is  more prominent and potentially increased in size. Recommend close attention on follow-up. (Consider CT as above) 3. No explanation for back pain.   09/11/2016 Imaging   MRI pancreas- Interval progression of pancreatic tail lesion. New differential perfusion right liver with areas of heterogeneity. Underlying metastatic disease in this region not excluded.   09/11/2016 Progression   MRi in conjunction with rising CA 19-9 are indicative of relapse of disease.   01/09/2017 Progression   CT C/A/P: 2.1 x 2.9 cm lesion in the pancreatic tail and abutting the splenic hilum, corresponding to known primary pancreatic neoplasm, mildly increased.  Scattered small hypoenhancing lesions in the liver measuring up to 10 mm, approximately 8-10 in number, suspicious for hepatic metastases.  No findings specific for metastatic disease in the chest.  Additional ancillary findings as above.    01/27/2017 - 10/20/2017 Chemotherapy   The patient had gemcitabine (GEMZAR) 1,292 mg in sodium chloride 0.9 % 250 mL chemo infusion, 800 mg/m2 = 1,292 mg, Intravenous,  Once, 9 of 12 cycles Administration: 1,292 mg (01/28/2017), 1,292 mg (02/11/2017), 1,292 mg (02/25/2017), 1,292 mg (03/11/2017), 1,292 mg (03/25/2017), 1,292 mg (04/08/2017), 1,292 mg (04/22/2017), 1,292 mg (04/29/2017), 1,292 mg (05/20/2017), 1,292 mg (06/03/2017), 1,292 mg (06/24/2017), 1,292 mg (07/08/2017), 1,292 mg (07/29/2017), 1,292 mg (08/12/2017), 1,292 mg (08/26/2017), 1,292 mg (09/09/2017), 1,292 mg (09/23/2017), 1,292 mg (10/07/2017)  for chemotherapy treatment.    10/21/2017 - 09/22/2018 Chemotherapy   The patient had leucovorin 700 mg in dextrose 5 % 250 mL infusion, 644 mg, Intravenous,  Once, 22 of 22 cycles Administration: 700 mg (10/21/2017), 700 mg (11/04/2017), 700 mg (11/18/2017), 700 mg (12/02/2017), 700 mg (12/16/2017), 700 mg (12/30/2017), 700 mg (01/13/2018), 700 mg (01/27/2018), 700 mg (02/10/2018), 700 mg (02/24/2018), 700 mg (03/10/2018), 700 mg  (03/24/2018), 700 mg (04/28/2018), 700 mg (05/17/2018), 700 mg (05/31/2018), 700 mg (06/16/2018), 700 mg (06/30/2018), 700 mg (07/21/2018), 700 mg (08/04/2018), 700 mg (08/25/2018), 700 mg (09/08/2018) ondansetron (ZOFRAN) 8 mg in sodium chloride 0.9 % 50 mL IVPB, , Intravenous,  Once, 21 of 21 cycles Administration:  (11/04/2017),  (01/27/2018),  (02/10/2018),  (02/24/2018),  (03/10/2018),  (03/24/2018),  (04/28/2018),  (05/17/2018),  (05/31/2018),  (06/16/2018),  (06/30/2018),  (07/21/2018),  (08/04/2018),  (08/25/2018),  (09/08/2018) fluorouracil (ADRUCIL) 3,850 mg in sodium chloride 0.9 % 73 mL chemo infusion, 2,400 mg/m2 = 3,850 mg, Intravenous, 1 Day/Dose, 22 of 22 cycles Dose modification: 1,920 mg/m2 (original dose 2,400 mg/m2, Cycle 2, Reason: Provider Judgment, Comment: mucisitis) Administration: 3,850 mg (10/21/2017), 3,100 mg (11/04/2017), 3,100 mg (11/18/2017), 3,100 mg (12/02/2017), 3,100 mg (12/16/2017), 3,100 mg (12/30/2017), 3,100 mg (01/13/2018), 3,100 mg (01/27/2018), 3,100 mg (02/10/2018), 3,100 mg (02/24/2018), 3,100 mg (03/10/2018), 3,100 mg (03/24/2018), 3,100 mg (04/28/2018), 3,100 mg (05/17/2018), 3,100 mg (05/31/2018), 3,100 mg (06/16/2018), 3,100 mg (06/30/2018), 3,100 mg (07/21/2018), 3,100 mg (08/04/2018), 3,100 mg (08/25/2018), 3,100 mg (09/08/2018) irinotecan LIPOSOME (ONIVYDE) 81.7 mg in sodium chloride 0.9 % 500 mL chemo infusion, 50 mg/m2 = 81.7 mg (100 % of original dose 50 mg/m2), Intravenous, Once, 22 of 22 cycles Dose modification: 50 mg/m2 (original dose 50 mg/m2, Cycle 1, Reason: Provider Judgment), 50 mg/m2 (original dose  50 mg/m2, Cycle 2, Reason: Provider Judgment) Administration: 81.7 mg (10/21/2017), 81.7 mg (11/04/2017), 81.7 mg (11/18/2017), 81.7 mg (12/02/2017), 81.7 mg (12/16/2017), 81.7 mg (12/30/2017), 81.7 mg (01/13/2018), 81.7 mg (01/27/2018), 81.7 mg (02/10/2018), 81.7 mg (02/24/2018), 81.7 mg (03/10/2018), 81.7 mg (03/24/2018), 81.7 mg (04/28/2018), 81.7 mg (05/17/2018), 81.7 mg (05/31/2018), 81.7 mg  (06/16/2018), 81.7 mg (06/30/2018), 81.7 mg (07/21/2018), 81.7 mg (08/04/2018), 81.7 mg (08/25/2018), 81.7 mg (09/08/2018)  for chemotherapy treatment.    09/22/2018 -  Chemotherapy   The patient had palonosetron (ALOXI) injection 0.25 mg, 0.25 mg, Intravenous,  Once, 4 of 6 cycles Administration: 0.25 mg (09/22/2018), 0.25 mg (10/11/2018), 0.25 mg (10/25/2018), 0.25 mg (11/17/2018) pegfilgrastim (NEULASTA) injection 6 mg, 6 mg, Subcutaneous, Once, 3 of 5 cycles Administration: 6 mg (10/13/2018), 6 mg (10/27/2018), 6 mg (11/19/2018) leucovorin 628 mg in dextrose 5 % 250 mL infusion, 400 mg/m2 = 628 mg, Intravenous,  Once, 4 of 6 cycles Administration: 628 mg (09/22/2018), 628 mg (10/11/2018), 628 mg (10/25/2018), 628 mg (11/17/2018) oxaliplatin (ELOXATIN) 100 mg in dextrose 5 % 500 mL chemo infusion, 65 mg/m2 = 100 mg (100 % of original dose 65 mg/m2), Intravenous,  Once, 4 of 6 cycles Dose modification: 65 mg/m2 (original dose 65 mg/m2, Cycle 1, Reason: Provider Judgment) Administration: 100 mg (09/22/2018), 100 mg (10/11/2018), 100 mg (10/25/2018), 100 mg (11/17/2018) fluorouracil (ADRUCIL) chemo injection 500 mg, 320 mg/m2 = 500 mg (100 % of original dose 320 mg/m2), Intravenous,  Once, 4 of 6 cycles Dose modification: 320 mg/m2 (original dose 320 mg/m2, Cycle 1, Reason: Provider Judgment) Administration: 500 mg (09/22/2018), 500 mg (10/11/2018), 500 mg (10/25/2018), 500 mg (11/17/2018) fluorouracil (ADRUCIL) 3,000 mg in sodium chloride 0.9 % 90 mL chemo infusion, 1,920 mg/m2 = 3,000 mg (100 % of original dose 1,920 mg/m2), Intravenous, 1 Day/Dose, 4 of 6 cycles Dose modification: 1,920 mg/m2 (original dose 1,920 mg/m2, Cycle 1, Reason: Provider Judgment) Administration: 3,000 mg (09/22/2018), 3,000 mg (10/11/2018), 3,000 mg (10/25/2018), 3,000 mg (11/17/2018)  for chemotherapy treatment.       RISK FACTORS:  Menarche was at age 55.  First live birth at age 85.  Ovaries intact: yes.  Hysterectomy: no.  Menopausal status:  postmenopausal.  HRT use: 0 years. Colonoscopy: yes; 2 polyps. Mammogram within the last year: yes. Number of breast biopsies: 0. Up to date with pelvic exams: yes. Any excessive radiation exposure in the past: no  Past Medical [name]:  Diagnosis Date   Anemia due to antineoplastic chemotherapy 09/12/2015   Started Aranesp 500 mcg on 09/12/2015   Anxiety    Chronic diarrhea    Depression    DNR (do not resuscitate) 08/16/2015   Erosive esophagitis    Family [name] of breast cancer in female    Family [name] of prostate cancer    GERD (gastroesophageal reflux disease)    Hx of adenomatous colonic polyps    tubular adenomas, last found in 2008   Hyperplastic colon polyp 03/19/10   tcs by Dr. Gala Romney   Hypertension 20 years    Kidney stone    hx/ crushed    Opioid contract exists 04/18/2015   With Dr. Moshe Cipro   Pancreatic cancer [facility]) 02/2014   Pancreatic cancer metastasized to liver ([place]) 03/10/2014   Schatzki's ring 08/26/10   Last dilated on EGD by Dr. Trevor Iha [facility], linear gastric erosions, BI hemigastrectomy    Past Surgical [name]:  Procedure Laterality Date   BREAST LUMPECTOMY Left    bunion removal     from both  feet    CHOLECYSTECTOMY  1965    COLONOSCOPY  03/19/2010   DR Gala Romney,, normal TI, pancolonic diverticula, random colon bx neg., hyperplastic polyps removed   ESOPHAGOGASTRODUODENOSCOPY  11/22/2003   DR Gala Romney, erosive RE, Billroth I   ESOPHAGOGASTRODUODENOSCOPY  08/26/10   Dr. Gala Romney- moderate severe ERE, Scahtzki ring s/p dilation, Billroth I, linear gastric erosions, bx-gastric xanthelasma   LEFT SHOULDER SURGERY  2009   DR HARRISON   ORIF ANKLE FRACTURE Right 05/22/2013   Procedure: OPEN REDUCTION INTERNAL FIXATION (ORIF) RIGHT ANKLE FRACTURE;  Surgeon: Sanjuana Kava, MD;  Location: [facility];  Service: Orthopedics;  Laterality: Right;   stomach ulcer  50 years ago    had some of her stomach removed     Social [name]   Socioeconomic  [name]   Marital status: Widowed    Spouse name: Not on file   Number of children: 2   Years of education: 61   Highest education level: 12th grade  Occupational [name]   Occupation: retired from Estate manager/land agent: [place] resource strain: Not hard at all   Food insecurity    Worry: Never true    Inability: Never true   Transportation needs    Medical: No    Non-medical: No  Tobacco Use   Smoking status: Current Some Day Smoker    Packs/day: 0.50    Years: 60.00    Pack years: 30.00    Types: Cigarettes    Last attempt to quit: 09/09/2016    Years since quitting: 2.2   Smokeless tobacco: Never Used  Substance and Sexual Activity   Alcohol use: No   Drug use: No   Sexual activity: Not Currently  Lifestyle   Physical activity    Days per week: 3 days    Minutes per session: 30 min   Stress: Not at all  Relationships   Social connections    Talks on phone: More than three times a week    Gets together: Three times a week    Attends religious service: 1 to 4 times per year    Active member of club or organization: No    Attends meetings of clubs or organizations: Never    Relationship status: Widowed  Other Topics Concern   Not on file  Social [name] Narrative   Lives with [name] alone      FAMILY [name]:  We obtained a detailed, 4-generation family [name].  Significant diagnoses are listed below: Family [name]  Problem Relation Age of Onset   Hypertension Mother    Kidney failure Brother        on dialysis   Diabetes Sister    Diabetes Sister    Hypertension Father    Kidney failure Sister    Kidney Stones Brother    Breast cancer [name] 64   Gout [name] 32   Gout Nephew 62   Liver disease Neg Hx    Colon cancer Neg Hx      The patient has two sons, one who had breast cancer at 39 and the other had prostate cancer at 35.  She has three brothers and five sisters.  One sister had a [name] with prostate  cancer at 30.  Both parents are deceased  The patient's mother died of a stroke.  She had four brothers and a sister, none had cancer.  There is no reported family [name] of cancer on the maternal side.  The patients father  died at 60 from non cancer related issues.  He had four brothers and a sister, none with cancer. There is no family [name] of cancer on the paternal side.  [name] is unaware of previous family [name] of genetic testing for hereditary cancer risks. Patient's maternal ancestors are of African American descent, and paternal ancestors are of African American descent. There is no reported Ashkenazi Jewish ancestry. There is no known consanguinity.    GENETIC COUNSELING ASSESSMENT: [name] is a 85 y.o. female with a personal and family [name] of cancer which is somewhat suggestive of a hereditary cancer syndrome and predisposition to cancer. We, therefore, discussed and recommended the following at today's visit.   DISCUSSION: We discussed that 5 - 10% of pancreatic cancer is hereditary, with most cases associated with BRCA mutations.  There are other genes that can be associated with hereditary pancreatic cancer syndromes.  We discussed that testing is beneficial for several reasons including knowing how to follow individuals after completing their treatment, identifying whether potential treatment options such as PARP inhibitors would be beneficial, and understand if other family members could be at risk for cancer and allow them to undergo genetic testing.   The patient stated that her husband also had died of pancreatic cancer.  His mother had pancreatic cancer and his brother had prostate cancer.  We discussed that based on the family [name], that maybe her sons had cancer due to a hereditary mutation coming from her husbands side of the family, and that her cancer is sporadic.  Therefore, her sons should both undergo genetic testing.  She is not aware that her [name] who  had breast cancer had genetic testing.  We discussed that his diagnosis by itself qualifies him for testing.  We reviewed the characteristics, features and inheritance patterns of hereditary cancer syndromes. We also discussed genetic testing, including the appropriate family members to test, the process of testing, insurance coverage and turn-around-time for results. We discussed the implications of a negative, positive and/or variant of uncertain significant result. We recommended [name] pursue genetic testing for the common hereditary cancer gene panel. The Common Hereditary Gene Panel offered by [facility] includes sequencing and/or deletion duplication testing of the following 48 genes: APC, ATM, AXIN2, BARD1, BMPR1A, BRCA1, BRCA2, BRIP1, CDH1, CDK4, CDKN2A (p14ARF), CDKN2A (p16INK4a), CHEK2, CTNNA1, DICER1, EPCAM (Deletion/duplication testing only), GREM1 (promoter region deletion/duplication testing only), KIT, MEN1, MLH1, MSH2, MSH3, MSH6, MUTYH, NBN, NF1, NHTL1, PALB2, PDGFRA, PMS2, POLD1, POLE, PTEN, RAD50, RAD51C, RAD51D, RNF43, SDHB, SDHC, SDHD, SMAD4, SMARCA4. STK11, TP53, TSC1, TSC2, and VHL.  The following genes were evaluated for sequence changes only: SDHA and HOXB13 c.251G>A variant only.   Based on [name] personal and family [name] of cancer, she meets medical criteria for genetic testing. Despite that she meets criteria, she may still have an out of pocket cost. We discussed that if her out of pocket cost for testing is over $100, the laboratory will call and confirm whether she wants to proceed with testing.  If the out of pocket cost of testing is less than $100 she will be billed by the genetic testing laboratory.   PLAN: After considering the risks, benefits, and limitations, [name] provided informed consent to pursue genetic testing and the blood sample was sent to [facility] for analysis of the common hereditary cancer panel. Results should be available within  approximately 2-3 weeks' time, at which point they will be disclosed by telephone to [name], [name], as  will any additional recommendations warranted by these results. [name] will receive a summary of her genetic counseling visit and a copy of her results once available. This information will also be available in Epic.   Based on [name], we recommended her [name] [name], who was diagnosed with female breast cancer at age 37, and her [name] [name] who had prostate cancer at 74, have genetic counseling and testing. Ms. Slotnick will let us know if we can be of any assistance in coordinating genetic counseling and/or testing for this family member.   Lastly, we encouraged Ms. Jurado to remain in contact with cancer genetics annually so that we can continuously update the family [name] and inform her of any changes in cancer genetics and testing that may be of benefit for this family.   Ms. Jachim questions were answered to her satisfaction today. Our contact information was provided should additional questions or concerns arise. Thank you for the referral and allowing [place] to share in the care of your patient.   Karen P. Florene Glen, [place], [facility] Certified Genetic Counselor [email or web address] phone: [phone]  The patient was seen for a total of 45 minutes in WebEx genetic counseling.  This patient was discussed with Drs. Magrinat, Lindi Adie and/or Burr Medico who agrees with the above.    _______________________________________________________________________ For Office Staff:  Number of people involved in session: 2 Was an Intern/ student involved with case: no

## 2018-12-01 ENCOUNTER — Inpatient Hospital Stay (HOSPITAL_COMMUNITY): Payer: Medicare HMO

## 2018-12-01 ENCOUNTER — Other Ambulatory Visit: Payer: Self-pay

## 2018-12-01 ENCOUNTER — Inpatient Hospital Stay (HOSPITAL_BASED_OUTPATIENT_CLINIC_OR_DEPARTMENT_OTHER): Payer: Medicare HMO | Admitting: Hematology

## 2018-12-01 ENCOUNTER — Encounter (HOSPITAL_COMMUNITY): Payer: Self-pay

## 2018-12-01 ENCOUNTER — Encounter (HOSPITAL_COMMUNITY): Payer: Self-pay | Admitting: Hematology

## 2018-12-01 VITALS — BP 167/62 | HR 62 | Temp 97.3°F | Resp 16

## 2018-12-01 DIAGNOSIS — Z5111 Encounter for antineoplastic chemotherapy: Secondary | ICD-10-CM | POA: Diagnosis not present

## 2018-12-01 DIAGNOSIS — C787 Secondary malignant neoplasm of liver and intrahepatic bile duct: Secondary | ICD-10-CM | POA: Diagnosis not present

## 2018-12-01 DIAGNOSIS — C259 Malignant neoplasm of pancreas, unspecified: Secondary | ICD-10-CM

## 2018-12-01 DIAGNOSIS — R634 Abnormal weight loss: Secondary | ICD-10-CM | POA: Diagnosis not present

## 2018-12-01 DIAGNOSIS — C252 Malignant neoplasm of tail of pancreas: Secondary | ICD-10-CM

## 2018-12-01 DIAGNOSIS — Z79899 Other long term (current) drug therapy: Secondary | ICD-10-CM

## 2018-12-01 DIAGNOSIS — Z5189 Encounter for other specified aftercare: Secondary | ICD-10-CM | POA: Diagnosis not present

## 2018-12-01 DIAGNOSIS — R69 Illness, unspecified: Secondary | ICD-10-CM | POA: Diagnosis not present

## 2018-12-01 DIAGNOSIS — F17210 Nicotine dependence, cigarettes, uncomplicated: Secondary | ICD-10-CM

## 2018-12-01 LAB — CBC WITH DIFFERENTIAL/PLATELET
Abs Immature Granulocytes: 0.08 10*3/uL — ABNORMAL HIGH (ref 0.00–0.07)
Basophils Absolute: 0 10*3/uL (ref 0.0–0.1)
Basophils Relative: 0 %
Eosinophils Absolute: 0.3 10*3/uL (ref 0.0–0.5)
Eosinophils Relative: 2 %
HCT: 31.6 % — ABNORMAL LOW (ref 36.0–46.0)
Hemoglobin: 10.3 g/dL — ABNORMAL LOW (ref 12.0–15.0)
Immature Granulocytes: 1 %
Lymphocytes Relative: 9 %
Lymphs Abs: 1 10*3/uL (ref 0.7–4.0)
MCH: 30.7 pg (ref 26.0–34.0)
MCHC: 32.6 g/dL (ref 30.0–36.0)
MCV: 94.3 fL (ref 80.0–100.0)
Monocytes Absolute: 0.7 10*3/uL (ref 0.1–1.0)
Monocytes Relative: 6 %
Neutro Abs: 9.7 10*3/uL — ABNORMAL HIGH (ref 1.7–7.7)
Neutrophils Relative %: 82 %
Platelets: 111 10*3/uL — ABNORMAL LOW (ref 150–400)
RBC: 3.35 MIL/uL — ABNORMAL LOW (ref 3.87–5.11)
RDW: 17.2 % — ABNORMAL HIGH (ref 11.5–15.5)
WBC: 11.8 10*3/uL — ABNORMAL HIGH (ref 4.0–10.5)
nRBC: 0 % (ref 0.0–0.2)

## 2018-12-01 LAB — COMPREHENSIVE METABOLIC PANEL
ALT: 12 U/L (ref 0–44)
AST: 18 U/L (ref 15–41)
Albumin: 3.5 g/dL (ref 3.5–5.0)
Alkaline Phosphatase: 197 U/L — ABNORMAL HIGH (ref 38–126)
Anion gap: 9 (ref 5–15)
BUN: 22 mg/dL (ref 8–23)
CO2: 25 mmol/L (ref 22–32)
Calcium: 8.5 mg/dL — ABNORMAL LOW (ref 8.9–10.3)
Chloride: 106 mmol/L (ref 98–111)
Creatinine, Ser: 1.27 mg/dL — ABNORMAL HIGH (ref 0.44–1.00)
GFR calc Af Amer: 44 mL/min — ABNORMAL LOW (ref >60)
GFR calc non Af Amer: 38 mL/min — ABNORMAL LOW (ref >60)
Glucose, Bld: 116 mg/dL — ABNORMAL HIGH (ref 70–99)
Potassium: 3.7 mmol/L (ref 3.5–5.1)
Sodium: 140 mmol/L (ref 135–145)
Total Bilirubin: 0.7 mg/dL (ref 0.3–1.2)
Total Protein: 7.2 g/dL (ref 6.5–8.1)

## 2018-12-01 LAB — MAGNESIUM: Magnesium: 2.2 mg/dL (ref 1.7–2.4)

## 2018-12-01 LAB — LACTATE DEHYDROGENASE: LDH: 171 U/L (ref 98–192)

## 2018-12-01 MED ORDER — DEXTROSE 5 % IV SOLN
Freq: Once | INTRAVENOUS | Status: AC
  Administered 2018-12-01: 10:00:00 via INTRAVENOUS

## 2018-12-01 MED ORDER — SODIUM CHLORIDE 0.9% FLUSH
10.0000 mL | INTRAVENOUS | Status: DC | PRN
  Administered 2018-12-01: 10 mL
  Filled 2018-12-01: qty 10

## 2018-12-01 MED ORDER — SODIUM CHLORIDE 0.9 % IV SOLN
10.0000 mg | Freq: Once | INTRAVENOUS | Status: AC
  Administered 2018-12-01: 10 mg via INTRAVENOUS
  Filled 2018-12-01: qty 10

## 2018-12-01 MED ORDER — SODIUM CHLORIDE 0.9 % IV SOLN
1920.0000 mg/m2 | INTRAVENOUS | Status: DC
  Administered 2018-12-01: 3000 mg via INTRAVENOUS
  Filled 2018-12-01: qty 50

## 2018-12-01 MED ORDER — OXALIPLATIN CHEMO INJECTION 100 MG/20ML
65.0000 mg/m2 | Freq: Once | INTRAVENOUS | Status: AC
  Administered 2018-12-01: 100 mg via INTRAVENOUS
  Filled 2018-12-01: qty 20

## 2018-12-01 MED ORDER — LEUCOVORIN CALCIUM INJECTION 350 MG
400.0000 mg/m2 | Freq: Once | INTRAVENOUS | Status: AC
  Administered 2018-12-01: 628 mg via INTRAVENOUS
  Filled 2018-12-01: qty 31.4

## 2018-12-01 MED ORDER — PALONOSETRON HCL INJECTION 0.25 MG/5ML
0.2500 mg | Freq: Once | INTRAVENOUS | Status: AC
  Administered 2018-12-01: 0.25 mg via INTRAVENOUS
  Filled 2018-12-01: qty 5

## 2018-12-01 MED ORDER — FLUOROURACIL CHEMO INJECTION 500 MG/10ML
320.0000 mg/m2 | Freq: Once | INTRAVENOUS | Status: AC
  Administered 2018-12-01: 500 mg via INTRAVENOUS
  Filled 2018-12-01: qty 10

## 2018-12-01 NOTE — Patient Instructions (Signed)
[facility] at [facility] Discharge Instructions  You were seen today by Dr. Katragadda. He went over your recent lab results. He will see you back in 2 weeks for labs and follow up.   Thank you for choosing [facility] at [facility] to provide your oncology and hematology care.  To afford each patient quality time with our provider, please arrive at least 15 minutes before your scheduled appointment time.   If you have a lab appointment with the Cancer Center please come in thru the  Main Entrance and check in at the main information desk  You need to re-schedule your appointment should you arrive 10 or more minutes late.  We strive to give you quality time with our providers, and arriving late affects you and other patients whose appointments are after yours.  Also, if you no show three or more times for appointments you may be dismissed from the clinic at the providers discretion.     Again, thank you for choosing [facility].  Our hope is that these requests will decrease the amount of time that you wait before being seen by our physicians.       _____________________________________________________________  Should you have questions after your visit to [facility], please contact our office at [phone] between the hours of 8:00 a.m. and 4:30 p.m.  Voicemails left after 4:00 p.m. will not be returned until the following business day.  For prescription refill requests, have your pharmacy contact our office and allow 72 hours.    Cancer Center Support Programs:   > Cancer Support Group  2nd Tuesday of the month 1pm-2pm, Journey Room    

## 2018-12-01 NOTE — Progress Notes (Signed)
[id number] Labs reviewed with and pt seen by Dr. Delton Coombes and pt approved for chemo tx today per MD                                                                                     Alecia Lemming tolerated chemo tx well without complaints or incident. Pt discharged with 5FU pump infusing without issues. VSS upon discharge. Pt discharged self ambulatory using her cane in satisfactory condition

## 2018-12-01 NOTE — Assessment & Plan Note (Signed)
1.  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 28 days from 01/28/2017 until 10/07/2017 with progression. - 21 cycles of infusional 5-FU and Onivyde from 10/21/2017 through 09/08/2018 with progression.    -CT CAP on 09/20/2018 shows a hypodense lesion in the segment 2, measuring 2 cm, previously 1.2 cm.  2.6 cm hypodense lesion in segment 7 previously measured 2.1 cm. -CA 19-9 has gone up to 171 on 08/25/2018 from 104 prior to that.  CA 19-9 was 270 on 11/17/2018. - 4 cycles of FOLFOX (20% dose reduced) from 09/22/2018 through 11/17/2018. -She gained 2 pounds from last visit.  She is continuing to tolerate chemotherapy very well. -She saw a geneticist on 11/25/2018 and did blood work. - We have reviewed her blood work today.  She will proceed with cycle 5 without any dose changes.  I will see her back in 2 weeks for follow-up.  I plan to repeat scans after cycle 6.  2.  Weight loss: -She gained 2 pounds since last visit in 2 weeks ago. -She is drinking Ensure 2 cans/day.  She is also continue Marinol twice daily.  3.  Genetic testing: -She was seen by geneticist on 11/25/2018.  We will also consider MSI testing in the future.  However it requires repeat biopsy.

## 2018-12-01 NOTE — Patient Instructions (Signed)
[place] at [facility] Discharge Instructions [place] Discharge Instructions for Patients Receiving Chemotherapy   Beginning January 23rd 2017 lab work for the [facility] will be done in the  Main lab at [facility] on 1st floor. If you have a lab appointment with the [facility] please come in thru the  Main Entrance and check in at the main information desk   Today you received the following chemotherapy agents Oxaliplatin,Leucovorin and 5FU. Follow-up as scheduled. Call clinic for any questions or concerns  To help prevent nausea and vomiting after your treatment, we encourage you to take your nausea medication   If you develop nausea and vomiting, or diarrhea that is not controlled by your medication, call the clinic.  The clinic phone number is [phone]. Office hours are Monday-Friday 8:30am-5:00pm.  BELOW ARE SYMPTOMS THAT SHOULD BE REPORTED IMMEDIATELY:  *FEVER GREATER THAN 101.0 F  *CHILLS WITH OR WITHOUT FEVER  NAUSEA AND VOMITING THAT IS NOT CONTROLLED WITH YOUR NAUSEA MEDICATION  *UNUSUAL SHORTNESS OF BREATH  *UNUSUAL BRUISING OR BLEEDING  TENDERNESS IN MOUTH AND THROAT WITH OR WITHOUT PRESENCE OF ULCERS  *URINARY PROBLEMS  *BOWEL PROBLEMS  UNUSUAL RASH Items with * indicate a potential emergency and should be followed up as soon as possible. If you have an emergency after office hours please contact your primary care physician or go to the nearest emergency department.  Please call the clinic during office hours if you have any questions or concerns.   You may also contact the Patient Navigator at [phone] should you have any questions or need assistance in obtaining follow up care.      Resources For Cancer Patients and their Caregivers ? American Cancer Society: Can assist with transportation, wigs, general needs, runs Look Good Feel Better.        408-577-5232 ? Cancer Care: Provides  financial assistance, online support groups, medication/co-pay assistance.  1-800-813-HOPE 8141330572) ? [facility] Assists [place] cancer patients and their families through emotional , educational and financial support.  (712) 213-5799 ? [place] DSS Where to apply for food stamps, Medicaid and utility assistance. 850-237-0946 ? [employer]: Transportation to medical appointments. 414-047-4871 ? Social Security Administration: May apply for disability if have a Stage IV cancer. 636 056 5873 (762) 199-9018 ? [employer], Disability and Transit Services: Assists with nutrition, care and transit needs. 332-667-0475           Thank you for choosing [place] at [facility] to provide your oncology and hematology care.  To afford each patient quality time with our provider, please arrive at least 15 minutes before your scheduled appointment time.   If you have a lab appointment with the [facility] please come in thru the  Main Entrance and check in at the main information desk  You need to re-schedule your appointment should you arrive 10 or more minutes late.  We strive to give you quality time with our providers, and arriving late affects you and other patients whose appointments are after yours.  Also, if you no show three or more times for appointments you may be dismissed from the clinic at the providers discretion.     Again, thank you for choosing [facility].  Our hope is that these requests will decrease the amount of time that you wait before being seen by our physicians.       _____________________________________________________________  Should you have  questions after your visit to [facility], please contact our office at [phone] between the hours of 8:00 a.m. and 4:30 p.m.  Voicemails left after 4:00 p.m. will not be returned until the following business day.  For prescription refill  requests, have your pharmacy contact our office and allow 72 hours.    Cancer Center Support Programs:   > Cancer Support Group  2nd Tuesday of the month 1pm-2pm, Journey Room

## 2018-12-01 NOTE — Patient Instructions (Signed)
[facility] at [facility] Discharge Instructions  Labs drawn from portacath today   Thank you for choosing [facility] at [facility] to provide your oncology and hematology care.  To afford each patient quality time with our provider, please arrive at least 15 minutes before your scheduled appointment time.   If you have a lab appointment with the Cancer Center please come in thru the  Main Entrance and check in at the main information desk  You need to re-schedule your appointment should you arrive 10 or more minutes late.  We strive to give you quality time with our providers, and arriving late affects you and other patients whose appointments are after yours.  Also, if you no show three or more times for appointments you may be dismissed from the clinic at the providers discretion.     Again, thank you for choosing [facility].  Our hope is that these requests will decrease the amount of time that you wait before being seen by our physicians.       _____________________________________________________________  Should you have questions after your visit to [facility], please contact our office at [phone] between the hours of 8:00 a.m. and 4:30 p.m.  Voicemails left after 4:00 p.m. will not be returned until the following business day.  For prescription refill requests, have your pharmacy contact our office and allow 72 hours.    Cancer Center Support Programs:   > Cancer Support Group  2nd Tuesday of the month 1pm-2pm, Journey Room   

## 2018-12-01 NOTE — Progress Notes (Signed)
[place], [place] [address]   CLINIC:  Medical Oncology/Hematology  PCP:  Fayrene Helper, MD [address], [place] [address] [phone]   REASON FOR VISIT:  Follow-up for metastatic pancreatic cancer to the liver   BRIEF ONCOLOGIC HISTORY:  Oncology History  Pancreatic cancer metastasized to liver ([place])  03/10/2014 Initial Diagnosis   Pancreatic cancer metastasized to liver   03/22/2014 - 03/05/2016 Chemotherapy   Abraxane/Gemzar days 1, 8, every 28 days.  Day 15 was cancelled due to leukopenia and thrombocytopenia on day 15 cycle 1.   05/24/2014 Treatment Plan Change   Day 8 of cycle 3 is held with ANC of 1.1   06/05/2014 Imaging   CT C/A/P . Interval decrease in size of the pancreatic tail mass.Improved hepatic metastatic disease. No new lesions. No CT findings for metastatic disease involving the chest.   08/09/2014 Tumor Marker   CA 19-9= 33 (WNL)   10/26/2014 Imaging   MRI- Continued interval decrease in size of the hepatic metastatic lesions and no new lesions are identified. Continued decrease in size of the pancreatic tail lesion.   01/03/2015 Tumor Marker   Results for [name], [name] (MRN [id number]) as of 01/18/2015 12:52  01/03/2015 10:00 CA 19-9: 14    01/17/2015 Imaging   MRI- Response to therapy of hepatic metastasis.  Similar size of a pancreatic tail lesion.   03/20/2015 Imaging   MRI-L spine- Severe disc space narrowing at L2-L3, with endplate reactive changes. Large disc extrusion into the ventral epidural space,central to the RIGHT with a cephalad migrated free fragment. Additional Large disc extrusion into the retroperitone...   03/22/2015 Imaging   CT pelvis- No evidence for metastatic disease within the pelvis.   07/24/2015 Imaging   MRI abd- Continued response to therapy, with no residual detectable liver metastases. No new sites of metastatic disease in the abdomen.    08/15/2015 Code Status     She confirms desire for DNR status.   12/21/2015 Imaging   MRI liver- Severe image degradation due to motion artifact reducing diagnostic sensitivity and specificity. Reduce conspicuity of the pancreatic tail lesion suggesting further improvement. The original liver lesions have resolved.   03/05/2016 Treatment Plan Change   Chemotherapy holiday/Break after 2 years worth of treatment   04/07/2016 Imaging   CT chest- When compared to recent chest CT, new minimally displaced anterior left sixth rib fracture. Slight increase in subcarinal adenopathy   04/28/2016 Imaging   Bone density- BMD as determined from Femur Neck Right is 0.705 g/cm2 with a T-Score of -2.4. This patient is considered osteopenic according to [facility]) criteria. Compared with the prior study on 02/23/2013, the BMD of the lumbar spine/rt. femoral neck show a statistically significant decrease.    05/23/2016 Imaging   MRI abd- 1. Exam is significantly degraded by patient respiratory motion. Consider follow-up exams with CT abdomen with without contrast per pancreatic protocol. 2. Fullness in tail of pancreas is more prominent and potentially increased in size. Recommend close attention on follow-up. (Consider CT as above) 3. No explanation for back pain.   09/11/2016 Imaging   MRI pancreas- Interval progression of pancreatic tail lesion. New differential perfusion right liver with areas of heterogeneity. Underlying metastatic disease in this region not excluded.   09/11/2016 Progression   MRi in conjunction with rising CA 19-9 are indicative of relapse of disease.   01/09/2017 Progression   CT C/A/P: 2.1 x 2.9  cm lesion in the pancreatic tail and abutting the splenic hilum, corresponding to known primary pancreatic neoplasm, mildly increased.  Scattered small hypoenhancing lesions in the liver measuring up to 10 mm, approximately 8-10 in number, suspicious for hepatic metastases.  No  findings specific for metastatic disease in the chest.  Additional ancillary findings as above.    01/27/2017 - 10/20/2017 Chemotherapy   The patient had gemcitabine (GEMZAR) 1,292 mg in sodium chloride 0.9 % 250 mL chemo infusion, 800 mg/m2 = 1,292 mg, Intravenous,  Once, 9 of 12 cycles Administration: 1,292 mg (01/28/2017), 1,292 mg (02/11/2017), 1,292 mg (02/25/2017), 1,292 mg (03/11/2017), 1,292 mg (03/25/2017), 1,292 mg (04/08/2017), 1,292 mg (04/22/2017), 1,292 mg (04/29/2017), 1,292 mg (05/20/2017), 1,292 mg (06/03/2017), 1,292 mg (06/24/2017), 1,292 mg (07/08/2017), 1,292 mg (07/29/2017), 1,292 mg (08/12/2017), 1,292 mg (08/26/2017), 1,292 mg (09/09/2017), 1,292 mg (09/23/2017), 1,292 mg (10/07/2017)  for chemotherapy treatment.    10/21/2017 - 09/22/2018 Chemotherapy   The patient had leucovorin 700 mg in dextrose 5 % 250 mL infusion, 644 mg, Intravenous,  Once, 22 of 22 cycles Administration: 700 mg (10/21/2017), 700 mg (11/04/2017), 700 mg (11/18/2017), 700 mg (12/02/2017), 700 mg (12/16/2017), 700 mg (12/30/2017), 700 mg (01/13/2018), 700 mg (01/27/2018), 700 mg (02/10/2018), 700 mg (02/24/2018), 700 mg (03/10/2018), 700 mg (03/24/2018), 700 mg (04/28/2018), 700 mg (05/17/2018), 700 mg (05/31/2018), 700 mg (06/16/2018), 700 mg (06/30/2018), 700 mg (07/21/2018), 700 mg (08/04/2018), 700 mg (08/25/2018), 700 mg (09/08/2018) ondansetron (ZOFRAN) 8 mg in sodium chloride 0.9 % 50 mL IVPB, , Intravenous,  Once, 21 of 21 cycles Administration:  (11/04/2017),  (01/27/2018),  (02/10/2018),  (02/24/2018),  (03/10/2018),  (03/24/2018),  (04/28/2018),  (05/17/2018),  (05/31/2018),  (06/16/2018),  (06/30/2018),  (07/21/2018),  (08/04/2018),  (08/25/2018),  (09/08/2018) fluorouracil (ADRUCIL) 3,850 mg in sodium chloride 0.9 % 73 mL chemo infusion, 2,400 mg/m2 = 3,850 mg, Intravenous, 1 Day/Dose, 22 of 22 cycles Dose modification: 1,920 mg/m2 (original dose 2,400 mg/m2, Cycle 2, Reason: Provider Judgment, Comment: mucisitis) Administration: 3,850 mg  (10/21/2017), 3,100 mg (11/04/2017), 3,100 mg (11/18/2017), 3,100 mg (12/02/2017), 3,100 mg (12/16/2017), 3,100 mg (12/30/2017), 3,100 mg (01/13/2018), 3,100 mg (01/27/2018), 3,100 mg (02/10/2018), 3,100 mg (02/24/2018), 3,100 mg (03/10/2018), 3,100 mg (03/24/2018), 3,100 mg (04/28/2018), 3,100 mg (05/17/2018), 3,100 mg (05/31/2018), 3,100 mg (06/16/2018), 3,100 mg (06/30/2018), 3,100 mg (07/21/2018), 3,100 mg (08/04/2018), 3,100 mg (08/25/2018), 3,100 mg (09/08/2018) irinotecan LIPOSOME (ONIVYDE) 81.7 mg in sodium chloride 0.9 % 500 mL chemo infusion, 50 mg/m2 = 81.7 mg (100 % of original dose 50 mg/m2), Intravenous, Once, 22 of 22 cycles Dose modification: 50 mg/m2 (original dose 50 mg/m2, Cycle 1, Reason: Provider Judgment), 50 mg/m2 (original dose 50 mg/m2, Cycle 2, Reason: Provider Judgment) Administration: 81.7 mg (10/21/2017), 81.7 mg (11/04/2017), 81.7 mg (11/18/2017), 81.7 mg (12/02/2017), 81.7 mg (12/16/2017), 81.7 mg (12/30/2017), 81.7 mg (01/13/2018), 81.7 mg (01/27/2018), 81.7 mg (02/10/2018), 81.7 mg (02/24/2018), 81.7 mg (03/10/2018), 81.7 mg (03/24/2018), 81.7 mg (04/28/2018), 81.7 mg (05/17/2018), 81.7 mg (05/31/2018), 81.7 mg (06/16/2018), 81.7 mg (06/30/2018), 81.7 mg (07/21/2018), 81.7 mg (08/04/2018), 81.7 mg (08/25/2018), 81.7 mg (09/08/2018)  for chemotherapy treatment.    09/22/2018 -  Chemotherapy   The patient had palonosetron (ALOXI) injection 0.25 mg, 0.25 mg, Intravenous,  Once, 5 of 6 cycles Administration: 0.25 mg (09/22/2018), 0.25 mg (10/11/2018), 0.25 mg (10/25/2018), 0.25 mg (12/01/2018), 0.25 mg (11/17/2018) pegfilgrastim (NEULASTA) injection 6 mg, 6 mg, Subcutaneous, Once, 4 of 5 cycles Administration: 6 mg (10/13/2018), 6 mg (10/27/2018), 6 mg (11/19/2018) leucovorin 628 mg in dextrose 5 %  250 mL infusion, 400 mg/m2 = 628 mg, Intravenous,  Once, 5 of 6 cycles Administration: 628 mg (09/22/2018), 628 mg (10/11/2018), 628 mg (10/25/2018), 628 mg (11/17/2018) oxaliplatin (ELOXATIN) 100 mg in dextrose 5 % 500 mL chemo infusion,  65 mg/m2 = 100 mg (100 % of original dose 65 mg/m2), Intravenous,  Once, 5 of 6 cycles Dose modification: 65 mg/m2 (original dose 65 mg/m2, Cycle 1, Reason: Provider Judgment) Administration: 100 mg (09/22/2018), 100 mg (10/11/2018), 100 mg (10/25/2018), 100 mg (11/17/2018) fluorouracil (ADRUCIL) chemo injection 500 mg, 320 mg/m2 = 500 mg (100 % of original dose 320 mg/m2), Intravenous,  Once, 5 of 6 cycles Dose modification: 320 mg/m2 (original dose 320 mg/m2, Cycle 1, Reason: Provider Judgment) Administration: 500 mg (09/22/2018), 500 mg (10/11/2018), 500 mg (10/25/2018), 500 mg (11/17/2018) fluorouracil (ADRUCIL) 3,000 mg in sodium chloride 0.9 % 90 mL chemo infusion, 1,920 mg/m2 = 3,000 mg (100 % of original dose 1,920 mg/m2), Intravenous, 1 Day/Dose, 5 of 6 cycles Dose modification: 1,920 mg/m2 (original dose 1,920 mg/m2, Cycle 1, Reason: Provider Judgment) Administration: 3,000 mg (09/22/2018), 3,000 mg (10/11/2018), 3,000 mg (10/25/2018), 3,000 mg (11/17/2018)  for chemotherapy treatment.       CANCER STAGING: Cancer Staging Pancreatic cancer metastasized to liver [facility]) Staging form: Pancreas, AJCC 7th Edition - Clinical: Stage IV (T3, N1, M1) - Signed by Baird Cancer, PA-C on 03/16/2014 - Pathologic: No stage assigned - Unsigned    INTERVAL HISTORY:  Ms. Adelson 85 y.o. female seen for follow-up of chemotherapy for pancreatic cancer.  She received cycle 4 on 11/17/2018.  Denies any GI side effects including nausea vomiting or constipation.  She did have diarrhea which is effectively controlled with Imodium.  She gets knees and ankles swollen at the end of the day.  She is trying to keep them elevated.  Appetite is 75%.  Energy levels are 50%.  She is using fluticasone cream ordered by her dermatologist for her rash around the neck area.  Denies any fevers or infections.  REVIEW OF SYSTEMS:  Review of Systems  Constitutional: Positive for fatigue.  Gastrointestinal: Positive for diarrhea.  Skin:  Positive for rash.  Neurological: Positive for numbness.  All other systems reviewed and are negative.    PAST MEDICAL/SURGICAL HISTORY:  Past Medical History:  Diagnosis Date  . Anemia due to antineoplastic chemotherapy 09/12/2015   Started Aranesp 500 mcg on 09/12/2015  . Anxiety   . Chronic diarrhea   . Depression   . DNR (do not resuscitate) 08/16/2015  . Erosive esophagitis   . Family history of breast cancer in female   . Family history of prostate cancer   . GERD (gastroesophageal reflux disease)   . Hx of adenomatous colonic polyps    tubular adenomas, last found in 2008  . Hyperplastic colon polyp 03/19/10   tcs by Dr. Gala Romney  . Hypertension 20 years   . Kidney stone    hx/ crushed   . Opioid contract exists 04/18/2015   With Dr. Moshe Cipro  . Pancreatic cancer ([place]) 02/2014  . Pancreatic cancer metastasized to liver ([place]) 03/10/2014  . Schatzki's ring 08/26/10   Last dilated on EGD by Dr. Trevor Iha [facility], linear gastric erosions, BI hemigastrectomy   Past Surgical History:  Procedure Laterality Date  . BREAST LUMPECTOMY Left   . bunion removal     from both feet   . CHOLECYSTECTOMY  1965   . COLONOSCOPY  03/19/2010   DR Gala Romney,, normal TI, pancolonic diverticula, random colon bx  neg., hyperplastic polyps removed  . ESOPHAGOGASTRODUODENOSCOPY  11/22/2003   DR Gala Romney, erosive RE, Billroth I  . ESOPHAGOGASTRODUODENOSCOPY  08/26/10   Dr. Gala Romney- moderate severe ERE, Scahtzki ring s/p dilation, Billroth I, linear gastric erosions, bx-gastric xanthelasma  . LEFT SHOULDER SURGERY  2009   DR HARRISON  . ORIF ANKLE FRACTURE Right 05/22/2013   Procedure: OPEN REDUCTION INTERNAL FIXATION (ORIF) RIGHT ANKLE FRACTURE;  Surgeon: Sanjuana Kava, MD;  Location: [facility];  Service: Orthopedics;  Laterality: Right;  . stomach ulcer  50 years ago    had some of her stomach removed      SOCIAL HISTORY:  Social History   Socioeconomic History  . Marital status: Widowed    Spouse name: Not on  file  . Number of children: 2  . Years of education: 106  . Highest education level: 12th grade  Occupational History  . Occupation: retired from Estate manager/land agent: RETIRED  Social Needs  . Financial resource strain: Not hard at all  . Food insecurity    Worry: Never true    Inability: Never true  . Transportation needs    Medical: No    Non-medical: No  Tobacco Use  . Smoking status: Current Some Day Smoker    Packs/day: 0.50    Years: 60.00    Pack years: 30.00    Types: Cigarettes    Last attempt to quit: 09/09/2016    Years since quitting: 2.2  . Smokeless tobacco: Never Used  Substance and Sexual Activity  . Alcohol use: No  . Drug use: No  . Sexual activity: Not Currently  Lifestyle  . Physical activity    Days per week: 3 days    Minutes per session: 30 min  . Stress: Not at all  Relationships  . Social connections    Talks on phone: More than three times a week    Gets together: Three times a week    Attends religious service: 1 to 4 times per year    Active member of club or organization: No    Attends meetings of clubs or organizations: Never    Relationship status: Widowed  . Intimate partner violence    Fear of current or ex partner: No    Emotionally abused: No    Physically abused: No    Forced sexual activity: No  Other Topics Concern  . Not on file  Social History Narrative   Lives with son alone     FAMILY HISTORY:  Family History  Problem Relation Age of Onset  . Hypertension Mother   . Kidney failure Brother        on dialysis  . Diabetes Sister   . Diabetes Sister   . Hypertension Father   . Kidney failure Sister   . Kidney Stones Brother   . Breast cancer Son 24  . Gout Son 8  . Gout Nephew 57  . Liver disease Neg Hx   . Colon cancer Neg Hx     CURRENT MEDICATIONS:  Outpatient Encounter Medications as of 12/01/2018  Medication Sig  . acetaminophen (TYLENOL) 500 MG tablet Take 500 mg by mouth every 6 (six) hours as needed for  mild pain or moderate pain.   Marland Kitchen amLODipine (NORVASC) 10 MG tablet Take 1 tablet (10 mg total) by mouth daily.  . clobetasol (TEMOVATE) 0.05 % external solution APP AA ON SCALP BID PRN NOT TO FACE GROIN OR UNDERARMS  . clonazePAM (KLONOPIN) 0.5 MG tablet Take  1 tablet (0.5 mg total) by mouth at bedtime.  . cycloSPORINE (RESTASIS) 0.05 % ophthalmic emulsion Place 1 drop into both eyes 2 (two) times daily.  . diphenhydrAMINE (BENADRYL) 50 MG tablet Take 1 tablet 1 hour prior to CT  . fluorometholone (FML) 0.1 % ophthalmic suspension INSTILL 1 DROP INTO EACH EYE THREE TIMES DAILY FOR 14 DAYS  . fluticasone (CUTIVATE) 0.05 % cream APPLY AA FACE BID PRN  . fluticasone (FLONASE) 50 MCG/ACT nasal spray Place 1 spray into both nostrils daily.  Marland Kitchen lidocaine (XYLOCAINE) 2 % solution RINSE WITH 5ML AS NEEDED TO RELIEVE MOUTH PAIN.  Marland Kitchen lidocaine-prilocaine (EMLA) cream Apply 1 application topically as needed. Apply to portacath site as needed  . metoprolol tartrate (LOPRESSOR) 25 MG tablet Take one tablet once daily for blood pressure  . MYRBETRIQ 50 MG TB24 tablet TAKE 1 TABLET DAILY  . pantoprazole (PROTONIX) 20 MG tablet Take 1 tablet by mouth daily  . [DISCONTINUED] prochlorperazine (COMPAZINE) 10 MG tablet Take 1 tablet (10 mg total) by mouth every 6 (six) hours as needed (Nausea or vomiting).   No facility-administered encounter medications on file as of 12/01/2018.     ALLERGIES:  Allergies  Allergen Reactions  . Iohexol Hives and Shortness Of Breath    **Patient can not have IV contrast, pt has breakthrough reaction even after 13 hour premeds** Do not give IV contrast PATIENT HAD TO BE TAKEN TO ED, C/O WHELPS, HIVES, DIFFICULTY BREATHING. PATIENT GIVEN IV CONTRAST AFTER 13 HOUR PRE MEDS ON 01/09/2017 WITH NO REACTION NOTED. On 10/19/17 patient had reaction despite premedication.    Marland Kitchen Aciphex [Rabeprazole Sodium] Other (See Comments)    unknown  . Amlodipine Besylate-Valsartan     Rash to high-dose  (5/320)  . Esomeprazole Magnesium   . Omeprazole Other (See Comments)    Patient states "medication didn't work"  . Ciprofloxacin Rash  . Penicillins Swelling and Rash    Has patient had a PCN reaction causing immediate rash, facial/tongue/throat swelling, SOB or lightheadedness with hypotension: Yes Has patient had a PCN reaction causing severe rash involving mucus membranes or skin necrosis: Yes Has patient had a PCN reaction that required hospitalization: No Has patient had a PCN reaction occurring within the last 10 years: yes If all of the above answers are "NO", then may proceed with Cephalosporin use.   [name] [Benzonatate] Itching    Full head, face, neck, and chest itching.      PHYSICAL EXAM:  ECOG Performance status: 1  There were no vitals filed for this visit. There were no vitals filed for this visit. I have reviewed her vitals.  Blood pressure is 130/97.  Pulse rate is 51.  Respiratory 16.  Temperature 98. Physical Exam Vitals signs reviewed.  Constitutional:      Appearance: Normal appearance.  Cardiovascular:     Rate and Rhythm: Normal rate and regular rhythm.     Heart sounds: Normal heart sounds.  Pulmonary:     Effort: Pulmonary effort is normal.     Breath sounds: Normal breath sounds.  Abdominal:     General: There is no distension.     Palpations: Abdomen is soft. There is no mass.  Musculoskeletal:        General: No swelling.  Skin:    General: Skin is warm.  Neurological:     General: No focal deficit present.     Mental Status: She is alert and oriented to person, place, and time.  Psychiatric:  Mood and Affect: Mood normal.        Behavior: Behavior normal.      LABORATORY DATA:  I have reviewed the labs as listed.  CBC    Component Value Date/Time   WBC 11.8 (H) 12/01/2018 0839   RBC 3.35 (L) 12/01/2018 0839   HGB 10.3 (L) 12/01/2018 0839   HCT 31.6 (L) 12/01/2018 0839   PLT 111 (L) 12/01/2018 0839   MCV 94.3  12/01/2018 0839   MCH 30.7 12/01/2018 0839   MCHC 32.6 12/01/2018 0839   RDW 17.2 (H) 12/01/2018 0839   LYMPHSABS 1.0 12/01/2018 0839   MONOABS 0.7 12/01/2018 0839   EOSABS 0.3 12/01/2018 0839   BASOSABS 0.0 12/01/2018 0839   CMP Latest Ref Rng & Units 12/01/2018 11/17/2018 10/25/2018  Glucose 70 - 99 mg/dL 116(H) 100(H) 104(H)  BUN 8 - 23 mg/dL 22 35(H) 28(H)  Creatinine 0.44 - 1.00 mg/dL 1.27(H) 1.32(H) 1.47(H)  Sodium 135 - 145 mmol/L 140 138 141  Potassium 3.5 - 5.1 mmol/L 3.7 4.3 4.2  Chloride 98 - 111 mmol/L 106 105 106  CO2 22 - 32 mmol/L _0 Calcium 8.9 - 10.3 mg/dL 8.5(L) 8.7(L) 8.8(L)  Total Protein 6.5 - 8.1 g/dL 7.2 7.2 7.1  Total Bilirubin 0.3 - 1.2 mg/dL 0.7 0.8 0.6  Alkaline Phos 38 - 126 U/L 197(H) 147(H) 170(H)  AST 15 - 41 U/L _1 ALT 0 - 44 U/L _2 DIAGNOSTIC IMAGING:  I have independently reviewed the scans and discussed with the patient.   I have reviewed Venita Lick LPN's note and agree with the documentation.  I personally performed a face-to-face visit, made revisions and my assessment and plan is as follows.    ASSESSMENT & PLAN:   Pancreatic cancer metastasized to liver ([place]) 1.  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 28 days from 01/28/2017 until 10/07/2017 with progression. - 21 cycles of infusional 5-FU and Onivyde from 10/21/2017 through 09/08/2018 with progression.    -CT CAP on 09/20/2018 shows a hypodense lesion in the segment 2, measuring 2 cm, previously 1.2 cm.  2.6 cm hypodense lesion in segment 7 previously measured 2.1 cm. -CA 19-9 has gone up to 171 on 08/25/2018 from 104 prior to that.  CA 19-9 was 270 on 11/17/2018. - 4 cycles of FOLFOX (20% dose reduced) from 09/22/2018 through  11/17/2018. -She gained 2 pounds from last visit.  She is continuing to tolerate chemotherapy very well. -She saw a geneticist on 11/25/2018 and did blood work. - We have reviewed her blood work today.  She will proceed with cycle 5 without any dose changes.  I will see her back in 2 weeks for follow-up.  I plan to repeat scans after cycle 6.  2.  Weight loss: -She gained 2 pounds since last visit in 2 weeks ago. -She is drinking Ensure 2 cans/day.  She is also continue Marinol twice daily.  3.  Genetic testing: -She was seen by geneticist on 11/25/2018.  We will also consider MSI testing in the future.  However it requires repeat biopsy.    Total time spent is 25 minutes with more than 50% of time spent face-to-face discussing treatment plan  and coordination of care.    Orders placed this encounter:  No orders of the defined types were placed in this encounter.     Derek Jack, MD [place] [phone]

## 2018-12-03 ENCOUNTER — Encounter (HOSPITAL_COMMUNITY): Payer: Self-pay

## 2018-12-03 ENCOUNTER — Inpatient Hospital Stay (HOSPITAL_COMMUNITY): Payer: Medicare HMO

## 2018-12-03 ENCOUNTER — Other Ambulatory Visit: Payer: Self-pay

## 2018-12-03 VITALS — BP 147/46 | HR 54 | Temp 97.7°F | Resp 18

## 2018-12-03 DIAGNOSIS — Z5189 Encounter for other specified aftercare: Secondary | ICD-10-CM | POA: Diagnosis not present

## 2018-12-03 DIAGNOSIS — C787 Secondary malignant neoplasm of liver and intrahepatic bile duct: Secondary | ICD-10-CM

## 2018-12-03 DIAGNOSIS — C252 Malignant neoplasm of tail of pancreas: Secondary | ICD-10-CM | POA: Diagnosis not present

## 2018-12-03 DIAGNOSIS — Z5111 Encounter for antineoplastic chemotherapy: Secondary | ICD-10-CM | POA: Diagnosis not present

## 2018-12-03 DIAGNOSIS — C259 Malignant neoplasm of pancreas, unspecified: Secondary | ICD-10-CM

## 2018-12-03 MED ORDER — SODIUM CHLORIDE 0.9% FLUSH
10.0000 mL | INTRAVENOUS | Status: DC | PRN
  Administered 2018-12-03: 10 mL
  Filled 2018-12-03: qty 10

## 2018-12-03 MED ORDER — HEPARIN SOD (PORK) LOCK FLUSH 100 UNIT/ML IV SOLN
500.0000 [IU] | Freq: Once | INTRAVENOUS | Status: AC | PRN
  Administered 2018-12-03: 500 [IU]

## 2018-12-03 MED ORDER — PEGFILGRASTIM INJECTION 6 MG/0.6ML ~~LOC~~
6.0000 mg | PREFILLED_SYRINGE | Freq: Once | SUBCUTANEOUS | Status: AC
  Administered 2018-12-03: 6 mg via SUBCUTANEOUS
  Filled 2018-12-03: qty 0.6

## 2018-12-03 NOTE — Progress Notes (Signed)
Patients port flushed without difficulty.  Good blood return noted before and after flush. No complaints of pain with flush and no bruising or swelling noted at site.  Band aid applied.  VSS with discharge and left ambulatory with no s/s of distress noted.  

## 2018-12-15 ENCOUNTER — Inpatient Hospital Stay (HOSPITAL_COMMUNITY): Payer: Medicare HMO | Attending: Hematology

## 2018-12-15 ENCOUNTER — Encounter (HOSPITAL_COMMUNITY): Payer: Self-pay

## 2018-12-15 ENCOUNTER — Inpatient Hospital Stay (HOSPITAL_COMMUNITY): Payer: Medicare HMO

## 2018-12-15 ENCOUNTER — Other Ambulatory Visit: Payer: Self-pay

## 2018-12-15 VITALS — BP 159/62 | HR 59 | Temp 96.8°F | Resp 18 | Wt 117.8 lb

## 2018-12-15 DIAGNOSIS — C252 Malignant neoplasm of tail of pancreas: Secondary | ICD-10-CM | POA: Insufficient documentation

## 2018-12-15 DIAGNOSIS — C259 Malignant neoplasm of pancreas, unspecified: Secondary | ICD-10-CM

## 2018-12-15 DIAGNOSIS — Z5189 Encounter for other specified aftercare: Secondary | ICD-10-CM | POA: Insufficient documentation

## 2018-12-15 DIAGNOSIS — C787 Secondary malignant neoplasm of liver and intrahepatic bile duct: Secondary | ICD-10-CM | POA: Diagnosis not present

## 2018-12-15 DIAGNOSIS — Z5111 Encounter for antineoplastic chemotherapy: Secondary | ICD-10-CM | POA: Diagnosis present

## 2018-12-15 LAB — CBC WITH DIFFERENTIAL/PLATELET
Abs Immature Granulocytes: 0.07 10*3/uL (ref 0.00–0.07)
Basophils Absolute: 0 10*3/uL (ref 0.0–0.1)
Basophils Relative: 0 %
Eosinophils Absolute: 0.2 10*3/uL (ref 0.0–0.5)
Eosinophils Relative: 1 %
HCT: 31.1 % — ABNORMAL LOW (ref 36.0–46.0)
Hemoglobin: 10.1 g/dL — ABNORMAL LOW (ref 12.0–15.0)
Immature Granulocytes: 1 %
Lymphocytes Relative: 10 %
Lymphs Abs: 1.1 10*3/uL (ref 0.7–4.0)
MCH: 30.2 pg (ref 26.0–34.0)
MCHC: 32.5 g/dL (ref 30.0–36.0)
MCV: 93.1 fL (ref 80.0–100.0)
Monocytes Absolute: 0.6 10*3/uL (ref 0.1–1.0)
Monocytes Relative: 5 %
Neutro Abs: 9.2 10*3/uL — ABNORMAL HIGH (ref 1.7–7.7)
Neutrophils Relative %: 83 %
Platelets: 105 10*3/uL — ABNORMAL LOW (ref 150–400)
RBC: 3.34 MIL/uL — ABNORMAL LOW (ref 3.87–5.11)
RDW: 17.2 % — ABNORMAL HIGH (ref 11.5–15.5)
WBC: 11.1 10*3/uL — ABNORMAL HIGH (ref 4.0–10.5)
nRBC: 0 % (ref 0.0–0.2)

## 2018-12-15 LAB — COMPREHENSIVE METABOLIC PANEL
ALT: 9 U/L (ref 0–44)
AST: 16 U/L (ref 15–41)
Albumin: 3.5 g/dL (ref 3.5–5.0)
Alkaline Phosphatase: 199 U/L — ABNORMAL HIGH (ref 38–126)
Anion gap: 10 (ref 5–15)
BUN: 27 mg/dL — ABNORMAL HIGH (ref 8–23)
CO2: 24 mmol/L (ref 22–32)
Calcium: 8.6 mg/dL — ABNORMAL LOW (ref 8.9–10.3)
Chloride: 107 mmol/L (ref 98–111)
Creatinine, Ser: 1.2 mg/dL — ABNORMAL HIGH (ref 0.44–1.00)
GFR calc Af Amer: 47 mL/min — ABNORMAL LOW (ref >60)
GFR calc non Af Amer: 40 mL/min — ABNORMAL LOW (ref >60)
Glucose, Bld: 127 mg/dL — ABNORMAL HIGH (ref 70–99)
Potassium: 3.8 mmol/L (ref 3.5–5.1)
Sodium: 141 mmol/L (ref 135–145)
Total Bilirubin: 0.9 mg/dL (ref 0.3–1.2)
Total Protein: 7.2 g/dL (ref 6.5–8.1)

## 2018-12-15 LAB — MAGNESIUM: Magnesium: 2 mg/dL (ref 1.7–2.4)

## 2018-12-15 LAB — LACTATE DEHYDROGENASE: LDH: 152 U/L (ref 98–192)

## 2018-12-15 MED ORDER — SODIUM CHLORIDE 0.9 % IV SOLN
10.0000 mg | Freq: Once | INTRAVENOUS | Status: AC
  Administered 2018-12-15: 10 mg via INTRAVENOUS
  Filled 2018-12-15: qty 10

## 2018-12-15 MED ORDER — SODIUM CHLORIDE 0.9% FLUSH
10.0000 mL | INTRAVENOUS | Status: DC | PRN
  Administered 2018-12-15: 10 mL
  Filled 2018-12-15: qty 10

## 2018-12-15 MED ORDER — LEUCOVORIN CALCIUM INJECTION 350 MG
400.0000 mg/m2 | Freq: Once | INTRAVENOUS | Status: AC
  Administered 2018-12-15: 11:00:00 628 mg via INTRAVENOUS
  Filled 2018-12-15: qty 31.4

## 2018-12-15 MED ORDER — OXALIPLATIN CHEMO INJECTION 100 MG/20ML
65.0000 mg/m2 | Freq: Once | INTRAVENOUS | Status: AC
  Administered 2018-12-15: 100 mg via INTRAVENOUS
  Filled 2018-12-15: qty 20

## 2018-12-15 MED ORDER — FLUOROURACIL CHEMO INJECTION 500 MG/10ML
320.0000 mg/m2 | Freq: Once | INTRAVENOUS | Status: AC
  Administered 2018-12-15: 500 mg via INTRAVENOUS
  Filled 2018-12-15: qty 10

## 2018-12-15 MED ORDER — PALONOSETRON HCL INJECTION 0.25 MG/5ML
0.2500 mg | Freq: Once | INTRAVENOUS | Status: AC
  Administered 2018-12-15: 0.25 mg via INTRAVENOUS
  Filled 2018-12-15: qty 5

## 2018-12-15 MED ORDER — DEXTROSE 5 % IV SOLN
Freq: Once | INTRAVENOUS | Status: AC
  Administered 2018-12-15: 10:00:00 via INTRAVENOUS

## 2018-12-15 MED ORDER — SODIUM CHLORIDE 0.9 % IV SOLN
1920.0000 mg/m2 | INTRAVENOUS | Status: DC
  Administered 2018-12-15: 14:00:00 3000 mg via INTRAVENOUS
  Filled 2018-12-15: qty 50

## 2018-12-15 NOTE — Patient Instructions (Signed)
[facility] Discharge Instructions for Patients Receiving Chemotherapy  Today you received the following chemotherapy agents   To help prevent nausea and vomiting after your treatment, we encourage you to take your nausea medication   If you develop nausea and vomiting that is not controlled by your nausea medication, call the clinic.   BELOW ARE SYMPTOMS THAT SHOULD BE REPORTED IMMEDIATELY:  *FEVER GREATER THAN 100.5 F  *CHILLS WITH OR WITHOUT FEVER  NAUSEA AND VOMITING THAT IS NOT CONTROLLED WITH YOUR NAUSEA MEDICATION  *UNUSUAL SHORTNESS OF BREATH  *UNUSUAL BRUISING OR BLEEDING  TENDERNESS IN MOUTH AND THROAT WITH OR WITHOUT PRESENCE OF ULCERS  *URINARY PROBLEMS  *BOWEL PROBLEMS  UNUSUAL RASH Items with * indicate a potential emergency and should be followed up as soon as possible.  Feel free to call the clinic should you have any questions or concerns. The clinic phone number is [phone].  Please show the CHEMO ALERT CARD at check-in to the Emergency Department and triage nurse.   

## 2018-12-15 NOTE — Progress Notes (Signed)
Pt presents today for treatment only. VS within parameters for treatment. MAR reviewed. Pt has no complaints of any changes since the last visit. Pt denies pain today.    Labs within parameters for treatment. Labs reviewed by [facility] NP. Proceed with treatment VO received.   Treatment given today per MD orders. Tolerated infusion without adverse affects. Vital signs stable. No complaints at this time. Discharged from clinic ambulatory. F/U with [facility] as scheduled.

## 2018-12-17 ENCOUNTER — Telehealth (HOSPITAL_COMMUNITY): Payer: Self-pay | Admitting: Hematology

## 2018-12-17 ENCOUNTER — Inpatient Hospital Stay (HOSPITAL_COMMUNITY): Payer: Medicare HMO

## 2018-12-17 ENCOUNTER — Other Ambulatory Visit: Payer: Self-pay

## 2018-12-17 ENCOUNTER — Encounter (HOSPITAL_COMMUNITY): Payer: Self-pay

## 2018-12-17 VITALS — BP 136/58 | HR 59 | Temp 97.3°F | Resp 18

## 2018-12-17 DIAGNOSIS — C787 Secondary malignant neoplasm of liver and intrahepatic bile duct: Secondary | ICD-10-CM | POA: Diagnosis not present

## 2018-12-17 DIAGNOSIS — C259 Malignant neoplasm of pancreas, unspecified: Secondary | ICD-10-CM

## 2018-12-17 DIAGNOSIS — Z5189 Encounter for other specified aftercare: Secondary | ICD-10-CM | POA: Diagnosis not present

## 2018-12-17 DIAGNOSIS — Z5111 Encounter for antineoplastic chemotherapy: Secondary | ICD-10-CM | POA: Diagnosis not present

## 2018-12-17 DIAGNOSIS — C252 Malignant neoplasm of tail of pancreas: Secondary | ICD-10-CM | POA: Diagnosis not present

## 2018-12-17 MED ORDER — HEPARIN SOD (PORK) LOCK FLUSH 100 UNIT/ML IV SOLN
500.0000 [IU] | Freq: Once | INTRAVENOUS | Status: AC | PRN
  Administered 2018-12-17: 500 [IU]

## 2018-12-17 MED ORDER — PEGFILGRASTIM INJECTION 6 MG/0.6ML ~~LOC~~
6.0000 mg | PREFILLED_SYRINGE | Freq: Once | SUBCUTANEOUS | Status: AC
  Administered 2018-12-17: 6 mg via SUBCUTANEOUS

## 2018-12-17 MED ORDER — SODIUM CHLORIDE 0.9% FLUSH
10.0000 mL | INTRAVENOUS | Status: DC | PRN
  Administered 2018-12-17: 10 mL
  Filled 2018-12-17: qty 10

## 2018-12-17 NOTE — Telephone Encounter (Signed)
Submitted request to [employer] to change neulasta to [place].. request is pending

## 2018-12-17 NOTE — Patient Instructions (Signed)
[place] at [facility] Discharge Instructions  5FU pump discontinued with portacath flushed and Neulasta injection given as well. Follow-up as scheduled. Call clinic for any questions or concerns   Thank you for choosing [place] at [facility] to provide your oncology and hematology care.  To afford each patient quality time with our provider, please arrive at least 15 minutes before your scheduled appointment time.   If you have a lab appointment with the [facility] please come in thru the  Main Entrance and check in at the main information desk  You need to re-schedule your appointment should you arrive 10 or more minutes late.  We strive to give you quality time with our providers, and arriving late affects you and other patients whose appointments are after yours.  Also, if you no show three or more times for appointments you may be dismissed from the clinic at the providers discretion.     Again, thank you for choosing [facility].  Our hope is that these requests will decrease the amount of time that you wait before being seen by our physicians.       _____________________________________________________________  Should you have questions after your visit to [facility], please contact our office at [phone] between the hours of 8:00 a.m. and 4:30 p.m.  Voicemails left after 4:00 p.m. will not be returned until the following business day.  For prescription refill requests, have your pharmacy contact our office and allow 72 hours.    Cancer Center Support Programs:   > Cancer Support Group  2nd Tuesday of the month 1pm-2pm, Journey Room

## 2018-12-17 NOTE — Progress Notes (Signed)
[name] tolerated 5FU pump well without complaints or incident. 5FU pump discontinued with portacath flushed easily per protocol. Neulasta injection given without issues. VSS Pt discharged self ambulatory using her cane in satisfactory condition

## 2018-12-28 ENCOUNTER — Telehealth: Payer: Self-pay | Admitting: Genetic Counselor

## 2018-12-28 ENCOUNTER — Ambulatory Visit: Payer: Self-pay | Admitting: Genetic Counselor

## 2018-12-28 ENCOUNTER — Encounter: Payer: Self-pay | Admitting: Genetic Counselor

## 2018-12-28 DIAGNOSIS — Z1379 Encounter for other screening for genetic and chromosomal anomalies: Secondary | ICD-10-CM | POA: Insufficient documentation

## 2018-12-28 NOTE — Telephone Encounter (Signed)
Revealed negative genetic testing.  Discussed that we do not know why she has pancreatic cancer or why there is cancer in the family. It could be due to a different gene that we are not testing, or maybe our current technology may not be able to pick something up.  It will be important for her to keep in contact with genetics to keep up with whether additional testing may be needed.   Dicer1 VUS identified.  This will not change medical management.

## 2018-12-28 NOTE — Progress Notes (Addendum)
HPI:  Ms. Betke was previously seen in [facility] due to a personal and family history of pancreatic cancer and concerns regarding a hereditary predisposition to cancer. Please refer to our prior cancer genetics clinic note for more information regarding our discussion, assessment and recommendations, at the time. Ms. Moroz recent genetic test results were disclosed to her, as were recommendations warranted by these results. These results and recommendations are discussed in more detail below.  CANCER HISTORY:  Oncology History  Pancreatic cancer metastasized to liver ([place])  03/10/2014 Initial Diagnosis   Pancreatic cancer metastasized to liver   03/22/2014 - 03/05/2016 Chemotherapy   Abraxane/Gemzar days 1, 8, every 28 days.  Day 15 was cancelled due to leukopenia and thrombocytopenia on day 15 cycle 1.   05/24/2014 Treatment Plan Change   Day 8 of cycle 3 is held with ANC of 1.1   06/05/2014 Imaging   CT C/A/P . Interval decrease in size of the pancreatic tail mass.Improved hepatic metastatic disease. No new lesions. No CT findings for metastatic disease involving the chest.   08/09/2014 Tumor Marker   CA 19-9= 33 (WNL)   10/26/2014 Imaging   MRI- Continued interval decrease in size of the hepatic metastatic lesions and no new lesions are identified. Continued decrease in size of the pancreatic tail lesion.   01/03/2015 Tumor Marker   Results for TODD, ARGABRIGHT (MRN [id number]) as of 01/18/2015 12:52  01/03/2015 10:00 CA 19-9: 14    01/17/2015 Imaging   MRI- Response to therapy of hepatic metastasis.  Similar size of a pancreatic tail lesion.   03/20/2015 Imaging   MRI-L spine- Severe disc space narrowing at L2-L3, with endplate reactive changes. Large disc extrusion into the ventral epidural space,central to the RIGHT with a cephalad migrated free fragment. Additional Large disc extrusion into the retroperitone...   03/22/2015 Imaging   CT pelvis- No  evidence for metastatic disease within the pelvis.   07/24/2015 Imaging   MRI abd- Continued response to therapy, with no residual detectable liver metastases. No new sites of metastatic disease in the abdomen.    08/15/2015 Code Status    She confirms desire for DNR status.   12/21/2015 Imaging   MRI liver- Severe image degradation due to motion artifact reducing diagnostic sensitivity and specificity. Reduce conspicuity of the pancreatic tail lesion suggesting further improvement. The original liver lesions have resolved.   03/05/2016 Treatment Plan Change   Chemotherapy holiday/Break after 2 years worth of treatment   04/07/2016 Imaging   CT chest- When compared to recent chest CT, new minimally displaced anterior left sixth rib fracture. Slight increase in subcarinal adenopathy   04/28/2016 Imaging   Bone density- BMD as determined from Femur Neck Right is 0.705 g/cm2 with a T-Score of -2.4. This patient is considered osteopenic according to [employer]) criteria. Compared with the prior study on 02/23/2013, the BMD of the lumbar spine/rt. femoral neck show a statistically significant decrease.    05/23/2016 Imaging   MRI abd- 1. Exam is significantly degraded by patient respiratory motion. Consider follow-up exams with CT abdomen with without contrast per pancreatic protocol. 2. Fullness in tail of pancreas is more prominent and potentially increased in size. Recommend close attention on follow-up. (Consider CT as above) 3. No explanation for back pain.   09/11/2016 Imaging   MRI pancreas- Interval progression of pancreatic tail lesion. New differential perfusion right liver with areas of heterogeneity. Underlying metastatic disease in this region not excluded.  09/11/2016 Progression   MRi in conjunction with rising CA 19-9 are indicative of relapse of disease.   01/09/2017 Progression   CT C/A/P: 2.1 x 2.9 cm lesion in the pancreatic tail and abutting the  splenic hilum, corresponding to known primary pancreatic neoplasm, mildly increased.  Scattered small hypoenhancing lesions in the liver measuring up to 10 mm, approximately 8-10 in number, suspicious for hepatic metastases.  No findings specific for metastatic disease in the chest.  Additional ancillary findings as above.    01/27/2017 - 10/20/2017 Chemotherapy   The patient had gemcitabine (GEMZAR) 1,292 mg in sodium chloride 0.9 % 250 mL chemo infusion, 800 mg/m2 = 1,292 mg, Intravenous,  Once, 9 of 12 cycles Administration: 1,292 mg (01/28/2017), 1,292 mg (02/11/2017), 1,292 mg (02/25/2017), 1,292 mg (03/11/2017), 1,292 mg (03/25/2017), 1,292 mg (04/08/2017), 1,292 mg (04/22/2017), 1,292 mg (04/29/2017), 1,292 mg (05/20/2017), 1,292 mg (06/03/2017), 1,292 mg (06/24/2017), 1,292 mg (07/08/2017), 1,292 mg (07/29/2017), 1,292 mg (08/12/2017), 1,292 mg (08/26/2017), 1,292 mg (09/09/2017), 1,292 mg (09/23/2017), 1,292 mg (10/07/2017)  for chemotherapy treatment.    10/21/2017 - 09/22/2018 Chemotherapy   The patient had leucovorin 700 mg in dextrose 5 % 250 mL infusion, 644 mg, Intravenous,  Once, 22 of 22 cycles Administration: 700 mg (10/21/2017), 700 mg (11/04/2017), 700 mg (11/18/2017), 700 mg (12/02/2017), 700 mg (12/16/2017), 700 mg (12/30/2017), 700 mg (01/13/2018), 700 mg (01/27/2018), 700 mg (02/10/2018), 700 mg (02/24/2018), 700 mg (03/10/2018), 700 mg (03/24/2018), 700 mg (04/28/2018), 700 mg (05/17/2018), 700 mg (05/31/2018), 700 mg (06/16/2018), 700 mg (06/30/2018), 700 mg (07/21/2018), 700 mg (08/04/2018), 700 mg (08/25/2018), 700 mg (09/08/2018) ondansetron (ZOFRAN) 8 mg in sodium chloride 0.9 % 50 mL IVPB, , Intravenous,  Once, 21 of 21 cycles Administration:  (11/04/2017),  (01/27/2018),  (02/10/2018),  (02/24/2018),  (03/10/2018),  (03/24/2018),  (04/28/2018),  (05/17/2018),  (05/31/2018),  (06/16/2018),  (06/30/2018),  (07/21/2018),  (08/04/2018),  (08/25/2018),  (09/08/2018) fluorouracil (ADRUCIL) 3,850 mg in sodium  chloride 0.9 % 73 mL chemo infusion, 2,400 mg/m2 = 3,850 mg, Intravenous, 1 Day/Dose, 22 of 22 cycles Dose modification: 1,920 mg/m2 (original dose 2,400 mg/m2, Cycle 2, Reason: Provider Judgment, Comment: mucisitis) Administration: 3,850 mg (10/21/2017), 3,100 mg (11/04/2017), 3,100 mg (11/18/2017), 3,100 mg (12/02/2017), 3,100 mg (12/16/2017), 3,100 mg (12/30/2017), 3,100 mg (01/13/2018), 3,100 mg (01/27/2018), 3,100 mg (02/10/2018), 3,100 mg (02/24/2018), 3,100 mg (03/10/2018), 3,100 mg (03/24/2018), 3,100 mg (04/28/2018), 3,100 mg (05/17/2018), 3,100 mg (05/31/2018), 3,100 mg (06/16/2018), 3,100 mg (06/30/2018), 3,100 mg (07/21/2018), 3,100 mg (08/04/2018), 3,100 mg (08/25/2018), 3,100 mg (09/08/2018) irinotecan LIPOSOME (ONIVYDE) 81.7 mg in sodium chloride 0.9 % 500 mL chemo infusion, 50 mg/m2 = 81.7 mg (100 % of original dose 50 mg/m2), Intravenous, Once, 22 of 22 cycles Dose modification: 50 mg/m2 (original dose 50 mg/m2, Cycle 1, Reason: Provider Judgment), 50 mg/m2 (original dose 50 mg/m2, Cycle 2, Reason: Provider Judgment) Administration: 81.7 mg (10/21/2017), 81.7 mg (11/04/2017), 81.7 mg (11/18/2017), 81.7 mg (12/02/2017), 81.7 mg (12/16/2017), 81.7 mg (12/30/2017), 81.7 mg (01/13/2018), 81.7 mg (01/27/2018), 81.7 mg (02/10/2018), 81.7 mg (02/24/2018), 81.7 mg (03/10/2018), 81.7 mg (03/24/2018), 81.7 mg (04/28/2018), 81.7 mg (05/17/2018), 81.7 mg (05/31/2018), 81.7 mg (06/16/2018), 81.7 mg (06/30/2018), 81.7 mg (07/21/2018), 81.7 mg (08/04/2018), 81.7 mg (08/25/2018), 81.7 mg (09/08/2018)  for chemotherapy treatment.    09/22/2018 -  Chemotherapy   The patient had palonosetron (ALOXI) injection 0.25 mg, 0.25 mg, Intravenous,  Once, 6 of 6 cycles Administration: 0.25 mg (09/22/2018), 0.25 mg (10/11/2018), 0.25 mg (10/25/2018), 0.25 mg (12/01/2018), 0.25 mg (12/15/2018), 0.25 mg (  11/17/2018) pegfilgrastim (NEULASTA) injection 6 mg, 6 mg, Subcutaneous, Once, 5 of 5 cycles Administration: 6 mg (10/13/2018), 6 mg (10/27/2018), 6 mg (12/03/2018), 6 mg  (12/17/2018), 6 mg (11/19/2018) leucovorin 628 mg in dextrose 5 % 250 mL infusion, 400 mg/m2 = 628 mg, Intravenous,  Once, 6 of 6 cycles Administration: 628 mg (09/22/2018), 628 mg (10/11/2018), 628 mg (10/25/2018), 628 mg (12/01/2018), 628 mg (12/15/2018), 628 mg (11/17/2018) oxaliplatin (ELOXATIN) 100 mg in dextrose 5 % 500 mL chemo infusion, 65 mg/m2 = 100 mg (100 % of original dose 65 mg/m2), Intravenous,  Once, 6 of 6 cycles Dose modification: 65 mg/m2 (original dose 65 mg/m2, Cycle 1, Reason: Provider Judgment) Administration: 100 mg (09/22/2018), 100 mg (10/11/2018), 100 mg (10/25/2018), 100 mg (12/01/2018), 100 mg (12/15/2018), 100 mg (11/17/2018) fluorouracil (ADRUCIL) chemo injection 500 mg, 320 mg/m2 = 500 mg (100 % of original dose 320 mg/m2), Intravenous,  Once, 6 of 6 cycles Dose modification: 320 mg/m2 (original dose 320 mg/m2, Cycle 1, Reason: Provider Judgment) Administration: 500 mg (09/22/2018), 500 mg (10/11/2018), 500 mg (10/25/2018), 500 mg (12/01/2018), 500 mg (12/15/2018), 500 mg (11/17/2018) fluorouracil (ADRUCIL) 3,000 mg in sodium chloride 0.9 % 90 mL chemo infusion, 1,920 mg/m2 = 3,000 mg (100 % of original dose 1,920 mg/m2), Intravenous, 1 Day/Dose, 6 of 6 cycles Dose modification: 1,920 mg/m2 (original dose 1,920 mg/m2, Cycle 1, Reason: Provider Judgment) Administration: 3,000 mg (09/22/2018), 3,000 mg (10/11/2018), 3,000 mg (10/25/2018), 3,000 mg (12/01/2018), 3,000 mg (12/15/2018), 3,000 mg (11/17/2018)  for chemotherapy treatment.    12/24/2018 Genetic Testing   Negative genetic testing on the common hereditary cancer panel.  The Common Hereditary Gene Panel offered by [facility] includes sequencing and/or deletion duplication testing of the following 48 genes: APC, ATM, AXIN2, BARD1, BMPR1A, BRCA1, BRCA2, BRIP1, CDH1, CDK4, CDKN2A (p14ARF), CDKN2A (p16INK4a), CHEK2, CTNNA1, DICER1, EPCAM (Deletion/duplication testing only), GREM1 (promoter region deletion/duplication testing only), KIT, MEN1, MLH1, MSH2, MSH3,  MSH6, MUTYH, NBN, NF1, NHTL1, PALB2, PDGFRA, PMS2, POLD1, POLE, PTEN, RAD50, RAD51C, RAD51D, RNF43, SDHB, SDHC, SDHD, SMAD4, SMARCA4. STK11, TP53, TSC1, TSC2, and VHL.  The following genes were evaluated for sequence changes only: SDHA and HOXB13 c.251G>A variant only. The report date is December 24, 2018.  DICER1 VUS identified.  This will not change medical decision making.     FAMILY HISTORY:  We obtained a detailed, 4-generation family history.  Significant diagnoses are listed below: Family History  Problem Relation Age of Onset   Hypertension Mother    Kidney failure Brother        on dialysis   Diabetes Sister    Diabetes Sister    Hypertension Father    Kidney failure Sister    Kidney Stones Brother    Breast cancer Son 83   Gout Son 73   Gout Nephew 52   Liver disease Neg Hx    Colon cancer Neg Hx     The patient has two sons, one who had breast cancer at 52 and the other had prostate cancer at 60.  She has three brothers and five sisters.  One sister had a son with prostate cancer at 74.  Both parents are deceased  The patient's mother died of a stroke.  She had four brothers and a sister, none had cancer.  There is no reported family history of cancer on the maternal side.  The patients father died at 67 from non cancer related issues.  He had four brothers and a sister, none with cancer. There is no  family history of cancer on the paternal side.  Ms. Secord is unaware of previous family history of genetic testing for hereditary cancer risks. Patient's maternal ancestors are of African American descent, and paternal ancestors are of African American descent. There is no reported Ashkenazi Jewish ancestry. There is no known consanguinity.     GENETIC TEST RESULTS: Genetic testing reported out on December 24, 2018 through the common hereditary cancer panel found no pathogenic mutations. The Common Hereditary Gene Panel offered by [facility] includes sequencing  and/or deletion duplication testing of the following 48 genes: APC, ATM, AXIN2, BARD1, BMPR1A, BRCA1, BRCA2, BRIP1, CDH1, CDK4, CDKN2A (p14ARF), CDKN2A (p16INK4a), CHEK2, CTNNA1, DICER1, EPCAM (Deletion/duplication testing only), GREM1 (promoter region deletion/duplication testing only), KIT, MEN1, MLH1, MSH2, MSH3, MSH6, MUTYH, NBN, NF1, NHTL1, PALB2, PDGFRA, PMS2, POLD1, POLE, PTEN, RAD50, RAD51C, RAD51D, RNF43, SDHB, SDHC, SDHD, SMAD4, SMARCA4. STK11, TP53, TSC1, TSC2, and VHL.  The following genes were evaluated for sequence changes only: SDHA and HOXB13 c.251G>A variant only. The test report has been scanned into EPIC and is located under the Molecular Pathology section of the Results Review tab.     We discussed with Ms. Hanko that because current genetic testing is not perfect, it is possible there may be a gene mutation in one of these genes that current testing cannot detect, but that chance is small.  We also discussed, that there could be another gene that has not yet been discovered, or that we have not yet tested, that is responsible for the cancer diagnoses in the family. It is also possible there is a hereditary cause for the cancer in the family that Ms. Dascenzo did not inherit and therefore was not identified in her testing.  Therefore, it is important to remain in touch with cancer genetics in the future so that we can continue to offer Ms. Seaberg the most up to date genetic testing.   Genetic testing did identify a variant of uncertain significance (VUS) was identified in the DICER1 gene called c.776C>T.  At this time, it is unknown if this variant is associated with increased cancer risk or if this is a normal finding, but most variants such as this get reclassified to being inconsequential. It should not be used to make medical management decisions. With time, we suspect the lab will determine the significance of this variant, if any. If we do learn more about it, we will try to contact Ms.  Kosik to discuss it further. However, it is important to stay in touch with [place] periodically and keep the address and phone number up to date.  ADDITIONAL GENETIC TESTING: We discussed with Ms. Mcmurtry that there are other genes that are associated with increased cancer risk that can be analyzed. Should Ms. Carano wish to pursue additional genetic testing, we are happy to discuss and coordinate this testing, at any time.    CANCER SCREENING RECOMMENDATIONS: Ms. Batta test result is considered negative (normal).  This means that we have not identified a hereditary cause for her personal and family history of cancer at this time. Most cancers happen by chance and this negative test suggests that her cancer may fall into this category.    While reassuring, this does not definitively rule out a hereditary predisposition to cancer. It is still possible that there could be genetic mutations that are undetectable by current technology. There could be genetic mutations in genes that have not been tested or identified to increase cancer risk.  Therefore, it is recommended  she continue to follow the cancer management and screening guidelines provided by her oncology and primary healthcare provider.   An individual's cancer risk and medical management are not determined by genetic test results alone. Overall cancer risk assessment incorporates additional factors, including personal medical history, family history, and any available genetic information that may result in a personalized plan for cancer prevention and surveillance  RECOMMENDATIONS FOR FAMILY MEMBERS:  Individuals in this family might be at some increased risk of developing cancer, over the general population risk, simply due to the family history of cancer.  We recommended women in this family have a yearly mammogram beginning at age 42, or 89 years younger than the earliest onset of cancer, an annual clinical breast exam, and perform monthly breast  self-exams. Women in this family should also have a gynecological exam as recommended by their primary provider. All family members should have a colonoscopy by age 84.  It is also possible there is a hereditary cause for the cancer in Ms. Macadam's family that she did not inherit and therefore was not identified in her.  Based on Ms. Sorey's family history, we recommended her son Naida Sleight, who was diagnosed with breast cancer or her son Barbaraann Rondo, who was diagnosed with prostate cancer at age 23, have genetic counseling and testing. Ms. Haye will let us know if we can be of any assistance in coordinating genetic counseling and/or testing for this family member.   FOLLOW-UP: Lastly, we discussed with Ms. Fadden that cancer genetics is a rapidly advancing field and it is possible that new genetic tests will be appropriate for her and/or her family members in the future. We encouraged her to remain in contact with cancer genetics on an annual basis so we can update her personal and family histories and let her know of advances in cancer genetics that may benefit this family.   Our contact number was provided. Ms. [name] questions were answered to her satisfaction, and she knows she is welcome to call us at anytime with additional questions or concerns.   Roma Kayser, [place], [facility] Licensed, Certified Genetic Counselor [email or web address]

## 2018-12-29 ENCOUNTER — Inpatient Hospital Stay (HOSPITAL_BASED_OUTPATIENT_CLINIC_OR_DEPARTMENT_OTHER): Payer: Medicare HMO | Admitting: Hematology

## 2018-12-29 ENCOUNTER — Other Ambulatory Visit: Payer: Self-pay

## 2018-12-29 ENCOUNTER — Inpatient Hospital Stay (HOSPITAL_COMMUNITY): Payer: Medicare HMO

## 2018-12-29 ENCOUNTER — Encounter (HOSPITAL_COMMUNITY): Payer: Self-pay | Admitting: Hematology

## 2018-12-29 VITALS — BP 157/42 | HR 49 | Temp 97.9°F | Resp 18 | Wt 115.2 lb

## 2018-12-29 VITALS — BP 166/57 | HR 51 | Temp 97.8°F | Resp 18

## 2018-12-29 DIAGNOSIS — C252 Malignant neoplasm of tail of pancreas: Secondary | ICD-10-CM | POA: Diagnosis not present

## 2018-12-29 DIAGNOSIS — C787 Secondary malignant neoplasm of liver and intrahepatic bile duct: Secondary | ICD-10-CM

## 2018-12-29 DIAGNOSIS — C259 Malignant neoplasm of pancreas, unspecified: Secondary | ICD-10-CM | POA: Diagnosis not present

## 2018-12-29 DIAGNOSIS — R300 Dysuria: Secondary | ICD-10-CM

## 2018-12-29 DIAGNOSIS — Z5189 Encounter for other specified aftercare: Secondary | ICD-10-CM | POA: Diagnosis not present

## 2018-12-29 DIAGNOSIS — Z5111 Encounter for antineoplastic chemotherapy: Secondary | ICD-10-CM | POA: Diagnosis not present

## 2018-12-29 LAB — COMPREHENSIVE METABOLIC PANEL
ALT: 10 U/L (ref 0–44)
AST: 15 U/L (ref 15–41)
Albumin: 3.5 g/dL (ref 3.5–5.0)
Alkaline Phosphatase: 208 U/L — ABNORMAL HIGH (ref 38–126)
Anion gap: 9 (ref 5–15)
BUN: 19 mg/dL (ref 8–23)
CO2: 24 mmol/L (ref 22–32)
Calcium: 8.5 mg/dL — ABNORMAL LOW (ref 8.9–10.3)
Chloride: 106 mmol/L (ref 98–111)
Creatinine, Ser: 1.28 mg/dL — ABNORMAL HIGH (ref 0.44–1.00)
GFR calc Af Amer: 43 mL/min — ABNORMAL LOW (ref >60)
GFR calc non Af Amer: 37 mL/min — ABNORMAL LOW (ref >60)
Glucose, Bld: 102 mg/dL — ABNORMAL HIGH (ref 70–99)
Potassium: 3.8 mmol/L (ref 3.5–5.1)
Sodium: 139 mmol/L (ref 135–145)
Total Bilirubin: 0.6 mg/dL (ref 0.3–1.2)
Total Protein: 7.2 g/dL (ref 6.5–8.1)

## 2018-12-29 LAB — URINALYSIS, COMPLETE (UACMP) WITH MICROSCOPIC
Bilirubin Urine: NEGATIVE
Glucose, UA: 50 mg/dL — AB
Hgb urine dipstick: NEGATIVE
Ketones, ur: NEGATIVE mg/dL
Leukocytes,Ua: NEGATIVE
Nitrite: NEGATIVE
Protein, ur: 100 mg/dL — AB
Specific Gravity, Urine: 1.013 (ref 1.005–1.030)
pH: 6 (ref 5.0–8.0)

## 2018-12-29 LAB — CBC WITH DIFFERENTIAL/PLATELET
Abs Immature Granulocytes: 0.08 10*3/uL — ABNORMAL HIGH (ref 0.00–0.07)
Basophils Absolute: 0.1 10*3/uL (ref 0.0–0.1)
Basophils Relative: 0 %
Eosinophils Absolute: 0.2 10*3/uL (ref 0.0–0.5)
Eosinophils Relative: 2 %
HCT: 31.1 % — ABNORMAL LOW (ref 36.0–46.0)
Hemoglobin: 10.3 g/dL — ABNORMAL LOW (ref 12.0–15.0)
Immature Granulocytes: 1 %
Lymphocytes Relative: 11 %
Lymphs Abs: 1.2 10*3/uL (ref 0.7–4.0)
MCH: 30.7 pg (ref 26.0–34.0)
MCHC: 33.1 g/dL (ref 30.0–36.0)
MCV: 92.6 fL (ref 80.0–100.0)
Monocytes Absolute: 0.8 10*3/uL (ref 0.1–1.0)
Monocytes Relative: 7 %
Neutro Abs: 8.9 10*3/uL — ABNORMAL HIGH (ref 1.7–7.7)
Neutrophils Relative %: 79 %
Platelets: 109 10*3/uL — ABNORMAL LOW (ref 150–400)
RBC: 3.36 MIL/uL — ABNORMAL LOW (ref 3.87–5.11)
RDW: 17.6 % — ABNORMAL HIGH (ref 11.5–15.5)
WBC: 11.1 10*3/uL — ABNORMAL HIGH (ref 4.0–10.5)
nRBC: 0 % (ref 0.0–0.2)

## 2018-12-29 LAB — MAGNESIUM: Magnesium: 2.1 mg/dL (ref 1.7–2.4)

## 2018-12-29 MED ORDER — PALONOSETRON HCL INJECTION 0.25 MG/5ML
0.2500 mg | Freq: Once | INTRAVENOUS | Status: AC
  Administered 2018-12-29: 0.25 mg via INTRAVENOUS
  Filled 2018-12-29: qty 5

## 2018-12-29 MED ORDER — LEUCOVORIN CALCIUM INJECTION 350 MG
400.0000 mg/m2 | Freq: Once | INTRAVENOUS | Status: AC
  Administered 2018-12-29: 12:00:00 628 mg via INTRAVENOUS
  Filled 2018-12-29: qty 31.4

## 2018-12-29 MED ORDER — SODIUM CHLORIDE 0.9 % IV SOLN
10.0000 mg | Freq: Once | INTRAVENOUS | Status: AC
  Administered 2018-12-29: 10 mg via INTRAVENOUS
  Filled 2018-12-29: qty 10

## 2018-12-29 MED ORDER — FLUOROURACIL CHEMO INJECTION 500 MG/10ML
320.0000 mg/m2 | Freq: Once | INTRAVENOUS | Status: AC
  Administered 2018-12-29: 15:00:00 500 mg via INTRAVENOUS
  Filled 2018-12-29: qty 10

## 2018-12-29 MED ORDER — HYDROCODONE-ACETAMINOPHEN 5-325 MG PO TABS
ORAL_TABLET | ORAL | Status: AC
  Filled 2018-12-29: qty 1

## 2018-12-29 MED ORDER — OXALIPLATIN CHEMO INJECTION 100 MG/20ML
65.0000 mg/m2 | Freq: Once | INTRAVENOUS | Status: AC
  Administered 2018-12-29: 12:00:00 100 mg via INTRAVENOUS
  Filled 2018-12-29: qty 20

## 2018-12-29 MED ORDER — HYDROCODONE-ACETAMINOPHEN 5-325 MG PO TABS
1.0000 | ORAL_TABLET | Freq: Once | ORAL | Status: AC
  Administered 2018-12-29: 1 via ORAL

## 2018-12-29 MED ORDER — DEXTROSE 5 % IV SOLN
Freq: Once | INTRAVENOUS | Status: AC
  Administered 2018-12-29: 11:00:00 via INTRAVENOUS

## 2018-12-29 MED ORDER — SODIUM CHLORIDE 0.9 % IV SOLN
1920.0000 mg/m2 | INTRAVENOUS | Status: DC
  Administered 2018-12-29: 15:00:00 3000 mg via INTRAVENOUS
  Filled 2018-12-29: qty 60

## 2018-12-29 NOTE — Patient Instructions (Addendum)
[facility] at [facility] Discharge Instructions  You were seen today by Dr. Katragadda. He went over your recent lab results. He will see you back in 2 weeks for labs and follow up.   Thank you for choosing [facility] at [facility] to provide your oncology and hematology care.  To afford each patient quality time with our provider, please arrive at least 15 minutes before your scheduled appointment time.   If you have a lab appointment with the Cancer Center please come in thru the  Main Entrance and check in at the main information desk  You need to re-schedule your appointment should you arrive 10 or more minutes late.  We strive to give you quality time with our providers, and arriving late affects you and other patients whose appointments are after yours.  Also, if you no show three or more times for appointments you may be dismissed from the clinic at the providers discretion.     Again, thank you for choosing  Cancer Center.  Our hope is that these requests will decrease the amount of time that you wait before being seen by our physicians.       _____________________________________________________________  Should you have questions after your visit to  Cancer Center, please contact our office at [phone] between the hours of 8:00 a.m. and 4:30 p.m.  Voicemails left after 4:00 p.m. will not be returned until the following business day.  For prescription refill requests, have your pharmacy contact our office and allow 72 hours.    Cancer Center Support Programs:   > Cancer Support Group  2nd Tuesday of the month 1pm-2pm, Journey Room    

## 2018-12-29 NOTE — Patient Instructions (Signed)
[facility] at [facility] Discharge Instructions  Labs drawn from portacath today   Thank you for choosing [facility] at [facility] to provide your oncology and hematology care.  To afford each patient quality time with our provider, please arrive at least 15 minutes before your scheduled appointment time.   If you have a lab appointment with the Cancer Center please come in thru the  Main Entrance and check in at the main information desk  You need to re-schedule your appointment should you arrive 10 or more minutes late.  We strive to give you quality time with our providers, and arriving late affects you and other patients whose appointments are after yours.  Also, if you no show three or more times for appointments you may be dismissed from the clinic at the providers discretion.     Again, thank you for choosing [facility].  Our hope is that these requests will decrease the amount of time that you wait before being seen by our physicians.       _____________________________________________________________  Should you have questions after your visit to [facility], please contact our office at [phone] between the hours of 8:00 a.m. and 4:30 p.m.  Voicemails left after 4:00 p.m. will not be returned until the following business day.  For prescription refill requests, have your pharmacy contact our office and allow 72 hours.    Cancer Center Support Programs:   > Cancer Support Group  2nd Tuesday of the month 1pm-2pm, Journey Room   

## 2018-12-29 NOTE — Progress Notes (Signed)
Ser Creat 1.28.  Patient seen by Dr. Delton Coombes with lab review and ok to treat today verbal order.  Patient to collect a urine sample for analysis today.   Patient tolerated chemotherapy with no complaints voiced.  Port site clean and dry with no bruising or swelling noted at site.  Good blood return noted before and after administration of chemotherapy.  Chemotherapy pump connected with no alarms noted.   Patient left ambulatory with VSS and no s/s of distress noted.

## 2018-12-29 NOTE — Progress Notes (Signed)
[place], [place] [address]   CLINIC:  Medical Oncology/Hematology  PCP:  Fayrene Helper, MD [address], [place] [address] [phone]   REASON FOR VISIT:  Follow-up for metastatic pancreatic cancer to the liver   BRIEF ONCOLOGIC HISTORY:  Oncology History  Pancreatic cancer metastasized to liver ([place])  03/10/2014 Initial Diagnosis   Pancreatic cancer metastasized to liver   03/22/2014 - 03/05/2016 Chemotherapy   Abraxane/Gemzar days 1, 8, every 28 days.  Day 15 was cancelled due to leukopenia and thrombocytopenia on day 15 cycle 1.   05/24/2014 Treatment Plan Change   Day 8 of cycle 3 is held with ANC of 1.1   06/05/2014 Imaging   CT C/A/P . Interval decrease in size of the pancreatic tail mass.Improved hepatic metastatic disease. No new lesions. No CT findings for metastatic disease involving the chest.   08/09/2014 Tumor Marker   CA 19-9= 33 (WNL)   10/26/2014 Imaging   MRI- Continued interval decrease in size of the hepatic metastatic lesions and no new lesions are identified. Continued decrease in size of the pancreatic tail lesion.   01/03/2015 Tumor Marker   Results for [name], [name] (MRN [id number]) as of 01/18/2015 12:52  01/03/2015 10:00 CA 19-9: 14    01/17/2015 Imaging   MRI- Response to therapy of hepatic metastasis.  Similar size of a pancreatic tail lesion.   03/20/2015 Imaging   MRI-L spine- Severe disc space narrowing at L2-L3, with endplate reactive changes. Large disc extrusion into the ventral epidural space,central to the RIGHT with a cephalad migrated free fragment. Additional Large disc extrusion into the retroperitone...   03/22/2015 Imaging   CT pelvis- No evidence for metastatic disease within the pelvis.   07/24/2015 Imaging   MRI abd- Continued response to therapy, with no residual detectable liver metastases. No new sites of metastatic disease in the abdomen.    08/15/2015 Code Status     She confirms desire for DNR status.   12/21/2015 Imaging   MRI liver- Severe image degradation due to motion artifact reducing diagnostic sensitivity and specificity. Reduce conspicuity of the pancreatic tail lesion suggesting further improvement. The original liver lesions have resolved.   03/05/2016 Treatment Plan Change   Chemotherapy holiday/Break after 2 years worth of treatment   04/07/2016 Imaging   CT chest- When compared to recent chest CT, new minimally displaced anterior left sixth rib fracture. Slight increase in subcarinal adenopathy   04/28/2016 Imaging   Bone density- BMD as determined from Femur Neck Right is 0.705 g/cm2 with a T-Score of -2.4. This patient is considered osteopenic according to [facility]) criteria. Compared with the prior study on 02/23/2013, the BMD of the lumbar spine/rt. femoral neck show a statistically significant decrease.    05/23/2016 Imaging   MRI abd- 1. Exam is significantly degraded by patient respiratory motion. Consider follow-up exams with CT abdomen with without contrast per pancreatic protocol. 2. Fullness in tail of pancreas is more prominent and potentially increased in size. Recommend close attention on follow-up. (Consider CT as above) 3. No explanation for back pain.   09/11/2016 Imaging   MRI pancreas- Interval progression of pancreatic tail lesion. New differential perfusion right liver with areas of heterogeneity. Underlying metastatic disease in this region not excluded.   09/11/2016 Progression   MRi in conjunction with rising CA 19-9 are indicative of relapse of disease.   01/09/2017 Progression   CT C/A/P: 2.1 x 2.9  cm lesion in the pancreatic tail and abutting the splenic hilum, corresponding to known primary pancreatic neoplasm, mildly increased.  Scattered small hypoenhancing lesions in the liver measuring up to 10 mm, approximately 8-10 in number, suspicious for hepatic metastases.  No  findings specific for metastatic disease in the chest.  Additional ancillary findings as above.    01/27/2017 - 10/20/2017 Chemotherapy   The patient had gemcitabine (GEMZAR) 1,292 mg in sodium chloride 0.9 % 250 mL chemo infusion, 800 mg/m2 = 1,292 mg, Intravenous,  Once, 9 of 12 cycles Administration: 1,292 mg (01/28/2017), 1,292 mg (02/11/2017), 1,292 mg (02/25/2017), 1,292 mg (03/11/2017), 1,292 mg (03/25/2017), 1,292 mg (04/08/2017), 1,292 mg (04/22/2017), 1,292 mg (04/29/2017), 1,292 mg (05/20/2017), 1,292 mg (06/03/2017), 1,292 mg (06/24/2017), 1,292 mg (07/08/2017), 1,292 mg (07/29/2017), 1,292 mg (08/12/2017), 1,292 mg (08/26/2017), 1,292 mg (09/09/2017), 1,292 mg (09/23/2017), 1,292 mg (10/07/2017)  for chemotherapy treatment.    10/21/2017 - 09/22/2018 Chemotherapy   The patient had leucovorin 700 mg in dextrose 5 % 250 mL infusion, 644 mg, Intravenous,  Once, 22 of 22 cycles Administration: 700 mg (10/21/2017), 700 mg (11/04/2017), 700 mg (11/18/2017), 700 mg (12/02/2017), 700 mg (12/16/2017), 700 mg (12/30/2017), 700 mg (01/13/2018), 700 mg (01/27/2018), 700 mg (02/10/2018), 700 mg (02/24/2018), 700 mg (03/10/2018), 700 mg (03/24/2018), 700 mg (04/28/2018), 700 mg (05/17/2018), 700 mg (05/31/2018), 700 mg (06/16/2018), 700 mg (06/30/2018), 700 mg (07/21/2018), 700 mg (08/04/2018), 700 mg (08/25/2018), 700 mg (09/08/2018) ondansetron (ZOFRAN) 8 mg in sodium chloride 0.9 % 50 mL IVPB, , Intravenous,  Once, 21 of 21 cycles Administration:  (11/04/2017),  (01/27/2018),  (02/10/2018),  (02/24/2018),  (03/10/2018),  (03/24/2018),  (04/28/2018),  (05/17/2018),  (05/31/2018),  (06/16/2018),  (06/30/2018),  (07/21/2018),  (08/04/2018),  (08/25/2018),  (09/08/2018) fluorouracil (ADRUCIL) 3,850 mg in sodium chloride 0.9 % 73 mL chemo infusion, 2,400 mg/m2 = 3,850 mg, Intravenous, 1 Day/Dose, 22 of 22 cycles Dose modification: 1,920 mg/m2 (original dose 2,400 mg/m2, Cycle 2, Reason: Provider Judgment, Comment: mucisitis) Administration: 3,850 mg  (10/21/2017), 3,100 mg (11/04/2017), 3,100 mg (11/18/2017), 3,100 mg (12/02/2017), 3,100 mg (12/16/2017), 3,100 mg (12/30/2017), 3,100 mg (01/13/2018), 3,100 mg (01/27/2018), 3,100 mg (02/10/2018), 3,100 mg (02/24/2018), 3,100 mg (03/10/2018), 3,100 mg (03/24/2018), 3,100 mg (04/28/2018), 3,100 mg (05/17/2018), 3,100 mg (05/31/2018), 3,100 mg (06/16/2018), 3,100 mg (06/30/2018), 3,100 mg (07/21/2018), 3,100 mg (08/04/2018), 3,100 mg (08/25/2018), 3,100 mg (09/08/2018) irinotecan LIPOSOME (ONIVYDE) 81.7 mg in sodium chloride 0.9 % 500 mL chemo infusion, 50 mg/m2 = 81.7 mg (100 % of original dose 50 mg/m2), Intravenous, Once, 22 of 22 cycles Dose modification: 50 mg/m2 (original dose 50 mg/m2, Cycle 1, Reason: Provider Judgment), 50 mg/m2 (original dose 50 mg/m2, Cycle 2, Reason: Provider Judgment) Administration: 81.7 mg (10/21/2017), 81.7 mg (11/04/2017), 81.7 mg (11/18/2017), 81.7 mg (12/02/2017), 81.7 mg (12/16/2017), 81.7 mg (12/30/2017), 81.7 mg (01/13/2018), 81.7 mg (01/27/2018), 81.7 mg (02/10/2018), 81.7 mg (02/24/2018), 81.7 mg (03/10/2018), 81.7 mg (03/24/2018), 81.7 mg (04/28/2018), 81.7 mg (05/17/2018), 81.7 mg (05/31/2018), 81.7 mg (06/16/2018), 81.7 mg (06/30/2018), 81.7 mg (07/21/2018), 81.7 mg (08/04/2018), 81.7 mg (08/25/2018), 81.7 mg (09/08/2018)  for chemotherapy treatment.    09/22/2018 -  Chemotherapy   The patient had palonosetron (ALOXI) injection 0.25 mg, 0.25 mg, Intravenous,  Once, 7 of 8 cycles Administration: 0.25 mg (09/22/2018), 0.25 mg (10/11/2018), 0.25 mg (10/25/2018), 0.25 mg (12/01/2018), 0.25 mg (12/15/2018), 0.25 mg (11/17/2018), 0.25 mg (12/29/2018) pegfilgrastim (NEULASTA) injection 6 mg, 6 mg, Subcutaneous, Once, 5 of 5 cycles Administration: 6 mg (10/13/2018), 6 mg (10/27/2018), 6 mg (12/03/2018), 6  mg (12/17/2018), 6 mg (11/19/2018) pegfilgrastim-cbqv (UDENYCA) injection 6 mg, 6 mg, Subcutaneous, Once, 1 of 2 cycles leucovorin 628 mg in dextrose 5 % 250 mL infusion, 400 mg/m2 = 628 mg, Intravenous,  Once, 7 of 8 cycles  Administration: 628 mg (09/22/2018), 628 mg (10/11/2018), 628 mg (10/25/2018), 628 mg (12/01/2018), 628 mg (12/15/2018), 628 mg (11/17/2018), 628 mg (12/29/2018) oxaliplatin (ELOXATIN) 100 mg in dextrose 5 % 500 mL chemo infusion, 65 mg/m2 = 100 mg (100 % of original dose 65 mg/m2), Intravenous,  Once, 7 of 8 cycles Dose modification: 65 mg/m2 (original dose 65 mg/m2, Cycle 1, Reason: Provider Judgment) Administration: 100 mg (09/22/2018), 100 mg (10/11/2018), 100 mg (10/25/2018), 100 mg (12/01/2018), 100 mg (12/15/2018), 100 mg (11/17/2018), 100 mg (12/29/2018) fluorouracil (ADRUCIL) chemo injection 500 mg, 320 mg/m2 = 500 mg (100 % of original dose 320 mg/m2), Intravenous,  Once, 7 of 8 cycles Dose modification: 320 mg/m2 (original dose 320 mg/m2, Cycle 1, Reason: Provider Judgment) Administration: 500 mg (09/22/2018), 500 mg (10/11/2018), 500 mg (10/25/2018), 500 mg (12/01/2018), 500 mg (12/15/2018), 500 mg (11/17/2018), 500 mg (12/29/2018) fluorouracil (ADRUCIL) 3,000 mg in sodium chloride 0.9 % 90 mL chemo infusion, 1,920 mg/m2 = 3,000 mg (100 % of original dose 1,920 mg/m2), Intravenous, 1 Day/Dose, 7 of 8 cycles Dose modification: 1,920 mg/m2 (original dose 1,920 mg/m2, Cycle 1, Reason: Provider Judgment) Administration: 3,000 mg (09/22/2018), 3,000 mg (10/11/2018), 3,000 mg (10/25/2018), 3,000 mg (12/01/2018), 3,000 mg (12/15/2018), 3,000 mg (11/17/2018), 3,000 mg (12/29/2018)  for chemotherapy treatment.    12/24/2018 Genetic Testing   Negative genetic testing on the common hereditary cancer panel.  The Common Hereditary Gene Panel offered by [facility] includes sequencing and/or deletion duplication testing of the following 48 genes: APC, ATM, AXIN2, BARD1, BMPR1A, BRCA1, BRCA2, BRIP1, CDH1, CDK4, CDKN2A (p14ARF), CDKN2A (p16INK4a), CHEK2, CTNNA1, DICER1, EPCAM (Deletion/duplication testing only), GREM1 (promoter region deletion/duplication testing only), KIT, MEN1, MLH1, MSH2, MSH3, MSH6, MUTYH, NBN, NF1, NHTL1, PALB2, PDGFRA, PMS2,  POLD1, POLE, PTEN, RAD50, RAD51C, RAD51D, RNF43, SDHB, SDHC, SDHD, SMAD4, SMARCA4. STK11, TP53, TSC1, TSC2, and VHL.  The following genes were evaluated for sequence changes only: SDHA and HOXB13 c.251G>A variant only. The report date is December 24, 2018.  DICER1 VUS identified.  This will not change medical decision making.      CANCER STAGING: Cancer Staging Pancreatic cancer metastasized to liver [facility]) Staging form: Pancreas, AJCC 7th Edition - Clinical: Stage IV (T3, N1, M1) - Signed by Baird Cancer, PA-C on 03/16/2014 - Pathologic: No stage assigned - Unsigned    INTERVAL HISTORY:  Ms. Vidana 85 y.o. female seen for follow-up and next cycle of chemotherapy.  She received cycle 6 on 12/15/2018.  She was not seen on that day for unknown reason.  Appetite and energy levels are 50%.  No pain is reported.  Numbness in the feet has been stable.  She reports nausea and stomach hurting when she drinks Ensure.  Hence she stopped drinking Ensure.  She lost about 5 pounds from last visit.  Denies any diarrhea or constipation.  Denies any bleeding per rectum or melena.  Appetite levels have been stable.  She complained of lower abdominal pain.  REVIEW OF SYSTEMS:  Review of Systems  Constitutional: Positive for fatigue.  Skin: Positive for rash.  Neurological: Positive for numbness.  All other systems reviewed and are negative.    PAST MEDICAL/SURGICAL HISTORY:  Past Medical History:  Diagnosis Date  . Anemia due to antineoplastic chemotherapy 09/12/2015   Started  Aranesp 500 mcg on 09/12/2015  . Anxiety   . Chronic diarrhea   . Depression   . DNR (do not resuscitate) 08/16/2015  . Erosive esophagitis   . Family history of breast cancer in female   . Family history of prostate cancer   . GERD (gastroesophageal reflux disease)   . Hx of adenomatous colonic polyps    tubular adenomas, last found in 2008  . Hyperplastic colon polyp 03/19/10   tcs by Dr. Gala Romney  . Hypertension 20 years   .  Kidney stone    hx/ crushed   . Opioid contract exists 04/18/2015   With Dr. Moshe Cipro  . Pancreatic cancer ([place]) 02/2014  . Pancreatic cancer metastasized to liver ([place]) 03/10/2014  . Schatzki's ring 08/26/10   Last dilated on EGD by Dr. Trevor Iha [facility], linear gastric erosions, BI hemigastrectomy   Past Surgical History:  Procedure Laterality Date  . BREAST LUMPECTOMY Left   . bunion removal     from both feet   . CHOLECYSTECTOMY  1965   . COLONOSCOPY  03/19/2010   DR Gala Romney,, normal TI, pancolonic diverticula, random colon bx neg., hyperplastic polyps removed  . ESOPHAGOGASTRODUODENOSCOPY  11/22/2003   DR Gala Romney, erosive RE, Billroth I  . ESOPHAGOGASTRODUODENOSCOPY  08/26/10   Dr. Gala Romney- moderate severe ERE, Scahtzki ring s/p dilation, Billroth I, linear gastric erosions, bx-gastric xanthelasma  . LEFT SHOULDER SURGERY  2009   DR HARRISON  . ORIF ANKLE FRACTURE Right 05/22/2013   Procedure: OPEN REDUCTION INTERNAL FIXATION (ORIF) RIGHT ANKLE FRACTURE;  Surgeon: Sanjuana Kava, MD;  Location: [facility];  Service: Orthopedics;  Laterality: Right;  . stomach ulcer  50 years ago    had some of her stomach removed      SOCIAL HISTORY:  Social History   Socioeconomic History  . Marital status: Widowed    Spouse name: Not on file  . Number of children: 2  . Years of education: 31  . Highest education level: 12th grade  Occupational History  . Occupation: retired from Estate manager/land agent: RETIRED  Social Needs  . Financial resource strain: Not hard at all  . Food insecurity    Worry: Never true    Inability: Never true  . Transportation needs    Medical: No    Non-medical: No  Tobacco Use  . Smoking status: Current Some Day Smoker    Packs/day: 0.50    Years: 60.00    Pack years: 30.00    Types: Cigarettes    Last attempt to quit: 09/09/2016    Years since quitting: 2.3  . Smokeless tobacco: Never Used  Substance and Sexual Activity  . Alcohol use: No  . Drug use: No  . Sexual  activity: Not Currently  Lifestyle  . Physical activity    Days per week: 3 days    Minutes per session: 30 min  . Stress: Not at all  Relationships  . Social connections    Talks on phone: More than three times a week    Gets together: Three times a week    Attends religious service: 1 to 4 times per year    Active member of club or organization: No    Attends meetings of clubs or organizations: Never    Relationship status: Widowed  . Intimate partner violence    Fear of current or ex partner: No    Emotionally abused: No    Physically abused: No    Forced sexual activity: No  Other Topics Concern  . Not on file  Social History Narrative   Lives with son alone     FAMILY HISTORY:  Family History  Problem Relation Age of Onset  . Hypertension Mother   . Kidney failure Brother        on dialysis  . Diabetes Sister   . Diabetes Sister   . Hypertension Father   . Kidney failure Sister   . Kidney Stones Brother   . Breast cancer Son 43  . Gout Son 84  . Gout Nephew 57  . Liver disease Neg Hx   . Colon cancer Neg Hx     CURRENT MEDICATIONS:  Outpatient Encounter Medications as of 12/29/2018  Medication Sig  . acetaminophen (TYLENOL) 500 MG tablet Take 500 mg by mouth every 6 (six) hours as needed for mild pain or moderate pain.   Marland Kitchen amLODipine (NORVASC) 10 MG tablet Take 1 tablet (10 mg total) by mouth daily.  . clobetasol (TEMOVATE) 0.05 % external solution APP AA ON SCALP BID PRN NOT TO FACE GROIN OR UNDERARMS  . clonazePAM (KLONOPIN) 0.5 MG tablet Take 1 tablet (0.5 mg total) by mouth at bedtime.  . cycloSPORINE (RESTASIS) 0.05 % ophthalmic emulsion Place 1 drop into both eyes 2 (two) times daily.  . diphenhydrAMINE (BENADRYL) 50 MG tablet Take 1 tablet 1 hour prior to CT  . fluorometholone (FML) 0.1 % ophthalmic suspension INSTILL 1 DROP INTO EACH EYE THREE TIMES DAILY FOR 14 DAYS  . fluticasone (CUTIVATE) 0.05 % cream APPLY AA FACE BID PRN  . fluticasone (FLONASE)  50 MCG/ACT nasal spray Place 1 spray into both nostrils daily.  Marland Kitchen lidocaine (XYLOCAINE) 2 % solution RINSE WITH 5ML AS NEEDED TO RELIEVE MOUTH PAIN.  Marland Kitchen lidocaine-prilocaine (EMLA) cream Apply 1 application topically as needed. Apply to portacath site as needed  . metoprolol tartrate (LOPRESSOR) 25 MG tablet Take one tablet once daily for blood pressure  . MYRBETRIQ 50 MG TB24 tablet TAKE 1 TABLET DAILY  . pantoprazole (PROTONIX) 20 MG tablet Take 1 tablet by mouth daily  . [DISCONTINUED] prochlorperazine (COMPAZINE) 10 MG tablet Take 1 tablet (10 mg total) by mouth every 6 (six) hours as needed (Nausea or vomiting).   No facility-administered encounter medications on file as of 12/29/2018.     ALLERGIES:  Allergies  Allergen Reactions  . Iohexol Hives and Shortness Of Breath    **Patient can not have IV contrast, pt has breakthrough reaction even after 13 hour premeds** Do not give IV contrast PATIENT HAD TO BE TAKEN TO ED, C/O WHELPS, HIVES, DIFFICULTY BREATHING. PATIENT GIVEN IV CONTRAST AFTER 13 HOUR PRE MEDS ON 01/09/2017 WITH NO REACTION NOTED. On 10/19/17 patient had reaction despite premedication.    Marland Kitchen Aciphex [Rabeprazole Sodium] Other (See Comments)    unknown  . Amlodipine Besylate-Valsartan     Rash to high-dose (5/320)  . Esomeprazole Magnesium   . Omeprazole Other (See Comments)    Patient states "medication didn't work"  . Ciprofloxacin Rash  . Penicillins Swelling and Rash    Has patient had a PCN reaction causing immediate rash, facial/tongue/throat swelling, SOB or lightheadedness with hypotension: Yes Has patient had a PCN reaction causing severe rash involving mucus membranes or skin necrosis: Yes Has patient had a PCN reaction that required hospitalization: No Has patient had a PCN reaction occurring within the last 10 years: yes If all of the above answers are "NO", then may proceed with Cephalosporin use.   [name] [  Benzonatate] Itching    Full head,  face, neck, and chest itching.      PHYSICAL EXAM:  ECOG Performance status: 1  Vitals:   12/29/18 0900  BP: (!) 157/42  Pulse: (!) 49  Resp: 18  Temp: 97.9 F (36.6 C)  SpO2: 100%   Filed Weights   12/29/18 0900  Weight: 115 lb 3.2 oz (52.3 kg)   I have reviewed her vitals.  Blood pressure is 130/97.  Pulse rate is 51.  Respiratory 16.  Temperature 98. Physical Exam Vitals signs reviewed.  Constitutional:      Appearance: Normal appearance.  Cardiovascular:     Rate and Rhythm: Normal rate and regular rhythm.     Heart sounds: Normal heart sounds.  Pulmonary:     Effort: Pulmonary effort is normal.     Breath sounds: Normal breath sounds.  Abdominal:     General: There is no distension.     Palpations: Abdomen is soft. There is no mass.  Musculoskeletal:        General: No swelling.  Skin:    General: Skin is warm.  Neurological:     General: No focal deficit present.     Mental Status: She is alert and oriented to person, place, and time.  Psychiatric:        Mood and Affect: Mood normal.        Behavior: Behavior normal.      LABORATORY DATA:  I have reviewed the labs as listed.  CBC    Component Value Date/Time   WBC 11.1 (H) 12/29/2018 0827   RBC 3.36 (L) 12/29/2018 0827   HGB 10.3 (L) 12/29/2018 0827   HCT 31.1 (L) 12/29/2018 0827   PLT 109 (L) 12/29/2018 0827   MCV 92.6 12/29/2018 0827   MCH 30.7 12/29/2018 0827   MCHC 33.1 12/29/2018 0827   RDW 17.6 (H) 12/29/2018 0827   LYMPHSABS 1.2 12/29/2018 0827   MONOABS 0.8 12/29/2018 0827   EOSABS 0.2 12/29/2018 0827   BASOSABS 0.1 12/29/2018 0827   CMP Latest Ref Rng & Units 12/29/2018 12/15/2018 12/01/2018  Glucose 70 - 99 mg/dL 102(H) 127(H) 116(H)  BUN 8 - 23 mg/dL 19 27(H) 22  Creatinine 0.44 - 1.00 mg/dL 1.28(H) 1.20(H) 1.27(H)  Sodium 135 - 145 mmol/L 139 141 140  Potassium 3.5 - 5.1 mmol/L 3.8 3.8 3.7  Chloride 98 - 111 mmol/L 106 107 106  CO2 22 - 32 mmol/L '24 24 25  '$ Calcium 8.9 - 10.3  mg/dL 8.5(L) 8.6(L) 8.5(L)  Total Protein 6.5 - 8.1 g/dL 7.2 7.2 7.2  Total Bilirubin 0.3 - 1.2 mg/dL 0.6 0.9 0.7  Alkaline Phos 38 - 126 U/L 208(H) 199(H) 197(H)  AST 15 - 41 U/L '15 16 18  '$ ALT 0 - 44 U/L '10 9 12       '$ DIAGNOSTIC IMAGING:  I have independently reviewed the scans and discussed with the patient.   I have reviewed Venita Lick LPN's note and agree with the documentation.  I personally performed a face-to-face visit, made revisions and my assessment and plan is as follows.    ASSESSMENT & PLAN:   Pancreatic cancer metastasized to liver ([place]) 1.  Metastatic pancreatic cancer to the liver: -Foundation 1 testing shows MS-stable and TMB cannot be determined.  K-ras G12V, TP 53 mutation. -Gemcitabine and Abraxane from normal 2015 through August 2018 with progression. -Gemcitabine and Tarceva from 01/28/2017 until 10/07/2017 with progression. - 21 cycles of infusional 5-FU and Onivyde from  10/21/2017 through 09/08/2018 with progression. - CT CAP on 09/20/2018 with hypodense lesion in segment 2, measuring 2 cm, previously 1.2 cm.  2.6 cm hypodense lesion in the segment 7 previously measured 2.1 cm. - 6 cycles of FOLFOX (20% dose reduced) from 09/22/2018 through 12/15/2018. - We reviewed her labs.  Her general condition is stable.  She complained of lower abdominal pain.  We did a UA which was negative. - She will proceed with her next cycle today.  I plan to repeat CT scans prior to next visit in 2 weeks to evaluate response.  2.  Weight loss: -She was gaining weight until last 2 weeks ago.  She lost about 5 pounds.  She is reportedly not drinking Ensure as it causes hurting of stomach.  I have suggested her to drink Ensure breeze.  3.  Genetic testing: - Germline mutation testing on 12/24/2018 was negative.  Total time spent is 25 minutes with more than 50% of time spent face-to-face discussing treatment plan, counseling and coordination of care.    Orders placed this  encounter:  Orders Placed This Encounter  Procedures  . CT Abdomen Pelvis Wo Contrast  . CT Chest Wo Contrast      Derek Jack, MD [place] [phone]

## 2018-12-30 NOTE — Assessment & Plan Note (Addendum)
1.  Metastatic pancreatic cancer to the liver: -Foundation 1 testing shows MS-stable and TMB cannot be determined.  K-ras G12V, TP 53 mutation. -Gemcitabine and Abraxane from normal 2015 through August 2018 with progression. -Gemcitabine and Tarceva from 01/28/2017 until 10/07/2017 with progression. - 21 cycles of infusional 5-FU and Onivyde from 10/21/2017 through 09/08/2018 with progression. - CT CAP on 09/20/2018 with hypodense lesion in segment 2, measuring 2 cm, previously 1.2 cm.  2.6 cm hypodense lesion in the segment 7 previously measured 2.1 cm. - 6 cycles of FOLFOX (20% dose reduced) from 09/22/2018 through 12/15/2018. - We reviewed her labs.  Her general condition is stable.  She complained of lower abdominal pain.  We did a UA which was negative. - She will proceed with her next cycle today.  I plan to repeat CT scans prior to next visit in 2 weeks to evaluate response.  2.  Weight loss: -She was gaining weight until last 2 weeks ago.  She lost about 5 pounds.  She is reportedly not drinking Ensure as it causes hurting of stomach.  I have suggested her to drink Ensure breeze.  3.  Genetic testing: - Germline mutation testing on 12/24/2018 was negative.

## 2018-12-31 ENCOUNTER — Other Ambulatory Visit: Payer: Self-pay

## 2018-12-31 ENCOUNTER — Inpatient Hospital Stay (HOSPITAL_COMMUNITY): Payer: Medicare HMO

## 2018-12-31 VITALS — BP 141/65 | HR 51 | Temp 96.9°F | Resp 16

## 2018-12-31 DIAGNOSIS — C787 Secondary malignant neoplasm of liver and intrahepatic bile duct: Secondary | ICD-10-CM

## 2018-12-31 DIAGNOSIS — Z5189 Encounter for other specified aftercare: Secondary | ICD-10-CM | POA: Diagnosis not present

## 2018-12-31 DIAGNOSIS — C252 Malignant neoplasm of tail of pancreas: Secondary | ICD-10-CM | POA: Diagnosis not present

## 2018-12-31 DIAGNOSIS — C259 Malignant neoplasm of pancreas, unspecified: Secondary | ICD-10-CM

## 2018-12-31 DIAGNOSIS — Z5111 Encounter for antineoplastic chemotherapy: Secondary | ICD-10-CM | POA: Diagnosis not present

## 2018-12-31 MED ORDER — SODIUM CHLORIDE 0.9% FLUSH
10.0000 mL | INTRAVENOUS | Status: DC | PRN
  Administered 2018-12-31: 10 mL
  Filled 2018-12-31: qty 10

## 2018-12-31 MED ORDER — HEPARIN SOD (PORK) LOCK FLUSH 100 UNIT/ML IV SOLN
500.0000 [IU] | Freq: Once | INTRAVENOUS | Status: AC | PRN
  Administered 2018-12-31: 500 [IU]

## 2018-12-31 MED ORDER — PEGFILGRASTIM-CBQV 6 MG/0.6ML ~~LOC~~ SOSY
6.0000 mg | PREFILLED_SYRINGE | Freq: Once | SUBCUTANEOUS | Status: AC
  Administered 2018-12-31: 13:00:00 6 mg via SUBCUTANEOUS

## 2019-01-01 DIAGNOSIS — Z20828 Contact with and (suspected) exposure to other viral communicable diseases: Secondary | ICD-10-CM | POA: Diagnosis not present

## 2019-01-01 DIAGNOSIS — R51 Headache: Secondary | ICD-10-CM | POA: Diagnosis not present

## 2019-01-10 ENCOUNTER — Ambulatory Visit (HOSPITAL_COMMUNITY)
Admission: RE | Admit: 2019-01-10 | Discharge: 2019-01-10 | Disposition: A | Discharge status: Home / Self Care | Payer: Medicare HMO | Source: Ambulatory Visit | Attending: Hematology | Admitting: Hematology

## 2019-01-10 ENCOUNTER — Other Ambulatory Visit: Payer: Self-pay

## 2019-01-10 DIAGNOSIS — C787 Secondary malignant neoplasm of liver and intrahepatic bile duct: Secondary | ICD-10-CM | POA: Diagnosis present

## 2019-01-10 DIAGNOSIS — C259 Malignant neoplasm of pancreas, unspecified: Secondary | ICD-10-CM | POA: Diagnosis not present

## 2019-01-10 DIAGNOSIS — C251 Malignant neoplasm of body of pancreas: Secondary | ICD-10-CM | POA: Diagnosis not present

## 2019-01-12 ENCOUNTER — Other Ambulatory Visit: Payer: Self-pay

## 2019-01-12 ENCOUNTER — Inpatient Hospital Stay (HOSPITAL_COMMUNITY): Payer: Medicare HMO | Attending: Hematology

## 2019-01-12 ENCOUNTER — Encounter (HOSPITAL_COMMUNITY): Payer: Self-pay | Admitting: Hematology

## 2019-01-12 ENCOUNTER — Inpatient Hospital Stay (HOSPITAL_COMMUNITY): Payer: Medicare HMO

## 2019-01-12 ENCOUNTER — Inpatient Hospital Stay (HOSPITAL_BASED_OUTPATIENT_CLINIC_OR_DEPARTMENT_OTHER): Payer: Medicare HMO | Admitting: Hematology

## 2019-01-12 DIAGNOSIS — C259 Malignant neoplasm of pancreas, unspecified: Secondary | ICD-10-CM

## 2019-01-12 DIAGNOSIS — Z23 Encounter for immunization: Secondary | ICD-10-CM | POA: Insufficient documentation

## 2019-01-12 DIAGNOSIS — R197 Diarrhea, unspecified: Secondary | ICD-10-CM | POA: Insufficient documentation

## 2019-01-12 DIAGNOSIS — F17210 Nicotine dependence, cigarettes, uncomplicated: Secondary | ICD-10-CM | POA: Insufficient documentation

## 2019-01-12 DIAGNOSIS — C252 Malignant neoplasm of tail of pancreas: Secondary | ICD-10-CM | POA: Diagnosis not present

## 2019-01-12 DIAGNOSIS — R634 Abnormal weight loss: Secondary | ICD-10-CM | POA: Insufficient documentation

## 2019-01-12 DIAGNOSIS — M7989 Other specified soft tissue disorders: Secondary | ICD-10-CM | POA: Insufficient documentation

## 2019-01-12 DIAGNOSIS — C787 Secondary malignant neoplasm of liver and intrahepatic bile duct: Secondary | ICD-10-CM | POA: Insufficient documentation

## 2019-01-12 DIAGNOSIS — Z803 Family history of malignant neoplasm of breast: Secondary | ICD-10-CM | POA: Insufficient documentation

## 2019-01-12 DIAGNOSIS — R69 Illness, unspecified: Secondary | ICD-10-CM | POA: Diagnosis not present

## 2019-01-12 LAB — COMPREHENSIVE METABOLIC PANEL
ALT: 12 U/L (ref 0–44)
AST: 17 U/L (ref 15–41)
Albumin: 3.5 g/dL (ref 3.5–5.0)
Alkaline Phosphatase: 187 U/L — ABNORMAL HIGH (ref 38–126)
Anion gap: 6 (ref 5–15)
BUN: 22 mg/dL (ref 8–23)
CO2: 22 mmol/L (ref 22–32)
Calcium: 8 mg/dL — ABNORMAL LOW (ref 8.9–10.3)
Chloride: 113 mmol/L — ABNORMAL HIGH (ref 98–111)
Creatinine, Ser: 1.2 mg/dL — ABNORMAL HIGH (ref 0.44–1.00)
GFR calc Af Amer: 47 mL/min — ABNORMAL LOW (ref >60)
GFR calc non Af Amer: 40 mL/min — ABNORMAL LOW (ref >60)
Glucose, Bld: 109 mg/dL — ABNORMAL HIGH (ref 70–99)
Potassium: 3.3 mmol/L — ABNORMAL LOW (ref 3.5–5.1)
Sodium: 141 mmol/L (ref 135–145)
Total Bilirubin: 0.6 mg/dL (ref 0.3–1.2)
Total Protein: 7.2 g/dL (ref 6.5–8.1)

## 2019-01-12 LAB — CBC WITH DIFFERENTIAL/PLATELET
Abs Immature Granulocytes: 0.08 10*3/uL — ABNORMAL HIGH (ref 0.00–0.07)
Basophils Absolute: 0 10*3/uL (ref 0.0–0.1)
Basophils Relative: 0 %
Eosinophils Absolute: 0.2 10*3/uL (ref 0.0–0.5)
Eosinophils Relative: 2 %
HCT: 30.2 % — ABNORMAL LOW (ref 36.0–46.0)
Hemoglobin: 9.7 g/dL — ABNORMAL LOW (ref 12.0–15.0)
Immature Granulocytes: 1 %
Lymphocytes Relative: 9 %
Lymphs Abs: 1.1 10*3/uL (ref 0.7–4.0)
MCH: 30.3 pg (ref 26.0–34.0)
MCHC: 32.1 g/dL (ref 30.0–36.0)
MCV: 94.4 fL (ref 80.0–100.0)
Monocytes Absolute: 0.8 10*3/uL (ref 0.1–1.0)
Monocytes Relative: 6 %
Neutro Abs: 10.3 10*3/uL — ABNORMAL HIGH (ref 1.7–7.7)
Neutrophils Relative %: 82 %
Platelets: 98 10*3/uL — ABNORMAL LOW (ref 150–400)
RBC: 3.2 MIL/uL — ABNORMAL LOW (ref 3.87–5.11)
RDW: 18.5 % — ABNORMAL HIGH (ref 11.5–15.5)
WBC: 12.4 10*3/uL — ABNORMAL HIGH (ref 4.0–10.5)
nRBC: 0 % (ref 0.0–0.2)

## 2019-01-12 LAB — MAGNESIUM: Magnesium: 1.9 mg/dL (ref 1.7–2.4)

## 2019-01-12 MED ORDER — SODIUM CHLORIDE 0.9% FLUSH
10.0000 mL | Freq: Once | INTRAVENOUS | Status: AC
  Administered 2019-01-12: 11:00:00 10 mL

## 2019-01-12 MED ORDER — HEPARIN SOD (PORK) LOCK FLUSH 100 UNIT/ML IV SOLN
500.0000 [IU] | Freq: Once | INTRAVENOUS | Status: AC
  Administered 2019-01-12: 500 [IU] via INTRAVENOUS

## 2019-01-12 NOTE — Patient Instructions (Signed)
[facility] at [facility] Discharge Instructions  You were seen today by Dr. Katragadda. He went over your recent lab results. He will see you back in 2 weeks for labs and follow up.   Thank you for choosing [facility] at [facility] to provide your oncology and hematology care.  To afford each patient quality time with our provider, please arrive at least 15 minutes before your scheduled appointment time.   If you have a lab appointment with the Cancer Center please come in thru the  Main Entrance and check in at the main information desk  You need to re-schedule your appointment should you arrive 10 or more minutes late.  We strive to give you quality time with our providers, and arriving late affects you and other patients whose appointments are after yours.  Also, if you no show three or more times for appointments you may be dismissed from the clinic at the providers discretion.     Again, thank you for choosing [facility].  Our hope is that these requests will decrease the amount of time that you wait before being seen by our physicians.       _____________________________________________________________  Should you have questions after your visit to [facility], please contact our office at [phone] between the hours of 8:00 a.m. and 4:30 p.m.  Voicemails left after 4:00 p.m. will not be returned until the following business day.  For prescription refill requests, have your pharmacy contact our office and allow 72 hours.    Cancer Center Support Programs:   > Cancer Support Group  2nd Tuesday of the month 1pm-2pm, Journey Room    

## 2019-01-12 NOTE — Progress Notes (Signed)
Per message from [facility] LPN. Hold tx today. Pt seen by Dr. Delton Coombes. CT scans from 01/10/19  Reviewed with pt. Labs reviewed.

## 2019-01-14 ENCOUNTER — Encounter (HOSPITAL_COMMUNITY): Payer: Medicare HMO

## 2019-01-15 ENCOUNTER — Encounter (HOSPITAL_COMMUNITY): Payer: Self-pay | Admitting: Hematology

## 2019-01-15 NOTE — Assessment & Plan Note (Signed)
1.  Metastatic pancreatic cancer to the liver: - Foundation 1 testing shows MS-stable, TMB cannot be determined.  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 - Gemcitabine and Abraxane from November 2015 through August 2018 with progression. -Gemcitabine and Tarceva from 01/28/2017 until 10/07/2017 with progression. -21 cycles of infusional 5-FU and Onivyde from 10/21/2017 through 09/08/2018 with progression. - 7 cycles of FOLFOX from 09/22/2018 through 12/29/2018. - We reviewed results of the CT CAP on 01/10/2019 which showed increase in size of the dominant hepatic lesion near the dome measuring 4.7 cm, previously 2.6 cm.  Other lesions in the liver are stable.  No new lesions were seen. - CA 19-9 has gone up to 270 on 11/17/2018 from 170 on 08/25/2018. -I discussed the findings with the patient in detail.  She has gone through all of the standard therapy for metastatic pancreatic cancer.  However she has good functional status. - Options include clinical trial and recycling of chemotherapy like gemcitabine or Xeloda. -I will see her back in 2 weeks for follow-up.  I will request her son to be present at next visit.  2.  Weight loss: - She gained 1 pound since last visit 2 weeks ago. -She started drinking Ensure breeze as she could not tolerate regular Ensure.  3.  Genetic testing: -MI mutation testing on 12/24/2018 was negative.

## 2019-01-15 NOTE — Progress Notes (Signed)
[place], [place] [address]   CLINIC:  Medical Oncology/Hematology  PCP:  Fayrene Helper, MD [address], [place] [address] [phone]   REASON FOR VISIT:  Follow-up for metastatic pancreatic cancer to the liver   BRIEF ONCOLOGIC HISTORY:  Oncology History  Pancreatic cancer metastasized to liver ([place])  03/10/2014 Initial Diagnosis   Pancreatic cancer metastasized to liver   03/22/2014 - 03/05/2016 Chemotherapy   Abraxane/Gemzar days 1, 8, every 28 days.  Day 15 was cancelled due to leukopenia and thrombocytopenia on day 15 cycle 1.   05/24/2014 Treatment Plan Change   Day 8 of cycle 3 is held with ANC of 1.1   06/05/2014 Imaging   CT C/A/P . Interval decrease in size of the pancreatic tail mass.Improved hepatic metastatic disease. No new lesions. No CT findings for metastatic disease involving the chest.   08/09/2014 Tumor Marker   CA 19-9= 33 (WNL)   10/26/2014 Imaging   MRI- Continued interval decrease in size of the hepatic metastatic lesions and no new lesions are identified. Continued decrease in size of the pancreatic tail lesion.   01/03/2015 Tumor Marker   Results for [name], [name] (MRN [id number]) as of 01/18/2015 12:52  01/03/2015 10:00 CA 19-9: 14    01/17/2015 Imaging   MRI- Response to therapy of hepatic metastasis.  Similar size of a pancreatic tail lesion.   03/20/2015 Imaging   MRI-L spine- Severe disc space narrowing at L2-L3, with endplate reactive changes. Large disc extrusion into the ventral epidural space,central to the RIGHT with a cephalad migrated free fragment. Additional Large disc extrusion into the retroperitone...   03/22/2015 Imaging   CT pelvis- No evidence for metastatic disease within the pelvis.   07/24/2015 Imaging   MRI abd- Continued response to therapy, with no residual detectable liver metastases. No new sites of metastatic disease in the abdomen.    08/15/2015 Code Status     She confirms desire for DNR status.   12/21/2015 Imaging   MRI liver- Severe image degradation due to motion artifact reducing diagnostic sensitivity and specificity. Reduce conspicuity of the pancreatic tail lesion suggesting further improvement. The original liver lesions have resolved.   03/05/2016 Treatment Plan Change   Chemotherapy holiday/Break after 2 years worth of treatment   04/07/2016 Imaging   CT chest- When compared to recent chest CT, new minimally displaced anterior left sixth rib fracture. Slight increase in subcarinal adenopathy   04/28/2016 Imaging   Bone density- BMD as determined from Femur Neck Right is 0.705 g/cm2 with a T-Score of -2.4. This patient is considered osteopenic according to [facility]) criteria. Compared with the prior study on 02/23/2013, the BMD of the lumbar spine/rt. femoral neck show a statistically significant decrease.    05/23/2016 Imaging   MRI abd- 1. Exam is significantly degraded by patient respiratory motion. Consider follow-up exams with CT abdomen with without contrast per pancreatic protocol. 2. Fullness in tail of pancreas is more prominent and potentially increased in size. Recommend close attention on follow-up. (Consider CT as above) 3. No explanation for back pain.   09/11/2016 Imaging   MRI pancreas- Interval progression of pancreatic tail lesion. New differential perfusion right liver with areas of heterogeneity. Underlying metastatic disease in this region not excluded.   09/11/2016 Progression   MRi in conjunction with rising CA 19-9 are indicative of relapse of disease.   01/09/2017 Progression   CT C/A/P: 2.1 x 2.9  cm lesion in the pancreatic tail and abutting the splenic hilum, corresponding to known primary pancreatic neoplasm, mildly increased.  Scattered small hypoenhancing lesions in the liver measuring up to 10 mm, approximately 8-10 in number, suspicious for hepatic metastases.  No  findings specific for metastatic disease in the chest.  Additional ancillary findings as above.    01/27/2017 - 10/20/2017 Chemotherapy   The patient had gemcitabine (GEMZAR) 1,292 mg in sodium chloride 0.9 % 250 mL chemo infusion, 800 mg/m2 = 1,292 mg, Intravenous,  Once, 9 of 12 cycles Administration: 1,292 mg (01/28/2017), 1,292 mg (02/11/2017), 1,292 mg (02/25/2017), 1,292 mg (03/11/2017), 1,292 mg (03/25/2017), 1,292 mg (04/08/2017), 1,292 mg (04/22/2017), 1,292 mg (04/29/2017), 1,292 mg (05/20/2017), 1,292 mg (06/03/2017), 1,292 mg (06/24/2017), 1,292 mg (07/08/2017), 1,292 mg (07/29/2017), 1,292 mg (08/12/2017), 1,292 mg (08/26/2017), 1,292 mg (09/09/2017), 1,292 mg (09/23/2017), 1,292 mg (10/07/2017)  for chemotherapy treatment.    10/21/2017 - 09/22/2018 Chemotherapy   The patient had leucovorin 700 mg in dextrose 5 % 250 mL infusion, 644 mg, Intravenous,  Once, 22 of 22 cycles Administration: 700 mg (10/21/2017), 700 mg (11/04/2017), 700 mg (11/18/2017), 700 mg (12/02/2017), 700 mg (12/16/2017), 700 mg (12/30/2017), 700 mg (01/13/2018), 700 mg (01/27/2018), 700 mg (02/10/2018), 700 mg (02/24/2018), 700 mg (03/10/2018), 700 mg (03/24/2018), 700 mg (04/28/2018), 700 mg (05/17/2018), 700 mg (05/31/2018), 700 mg (06/16/2018), 700 mg (06/30/2018), 700 mg (07/21/2018), 700 mg (08/04/2018), 700 mg (08/25/2018), 700 mg (09/08/2018) ondansetron (ZOFRAN) 8 mg in sodium chloride 0.9 % 50 mL IVPB, , Intravenous,  Once, 21 of 21 cycles Administration:  (11/04/2017),  (01/27/2018),  (02/10/2018),  (02/24/2018),  (03/10/2018),  (03/24/2018),  (04/28/2018),  (05/17/2018),  (05/31/2018),  (06/16/2018),  (06/30/2018),  (07/21/2018),  (08/04/2018),  (08/25/2018),  (09/08/2018) fluorouracil (ADRUCIL) 3,850 mg in sodium chloride 0.9 % 73 mL chemo infusion, 2,400 mg/m2 = 3,850 mg, Intravenous, 1 Day/Dose, 22 of 22 cycles Dose modification: 1,920 mg/m2 (original dose 2,400 mg/m2, Cycle 2, Reason: Provider Judgment, Comment: mucisitis) Administration: 3,850 mg  (10/21/2017), 3,100 mg (11/04/2017), 3,100 mg (11/18/2017), 3,100 mg (12/02/2017), 3,100 mg (12/16/2017), 3,100 mg (12/30/2017), 3,100 mg (01/13/2018), 3,100 mg (01/27/2018), 3,100 mg (02/10/2018), 3,100 mg (02/24/2018), 3,100 mg (03/10/2018), 3,100 mg (03/24/2018), 3,100 mg (04/28/2018), 3,100 mg (05/17/2018), 3,100 mg (05/31/2018), 3,100 mg (06/16/2018), 3,100 mg (06/30/2018), 3,100 mg (07/21/2018), 3,100 mg (08/04/2018), 3,100 mg (08/25/2018), 3,100 mg (09/08/2018) irinotecan LIPOSOME (ONIVYDE) 81.7 mg in sodium chloride 0.9 % 500 mL chemo infusion, 50 mg/m2 = 81.7 mg (100 % of original dose 50 mg/m2), Intravenous, Once, 22 of 22 cycles Dose modification: 50 mg/m2 (original dose 50 mg/m2, Cycle 1, Reason: Provider Judgment), 50 mg/m2 (original dose 50 mg/m2, Cycle 2, Reason: Provider Judgment) Administration: 81.7 mg (10/21/2017), 81.7 mg (11/04/2017), 81.7 mg (11/18/2017), 81.7 mg (12/02/2017), 81.7 mg (12/16/2017), 81.7 mg (12/30/2017), 81.7 mg (01/13/2018), 81.7 mg (01/27/2018), 81.7 mg (02/10/2018), 81.7 mg (02/24/2018), 81.7 mg (03/10/2018), 81.7 mg (03/24/2018), 81.7 mg (04/28/2018), 81.7 mg (05/17/2018), 81.7 mg (05/31/2018), 81.7 mg (06/16/2018), 81.7 mg (06/30/2018), 81.7 mg (07/21/2018), 81.7 mg (08/04/2018), 81.7 mg (08/25/2018), 81.7 mg (09/08/2018)  for chemotherapy treatment.    09/22/2018 -  Chemotherapy   The patient had palonosetron (ALOXI) injection 0.25 mg, 0.25 mg, Intravenous,  Once, 7 of 8 cycles Administration: 0.25 mg (09/22/2018), 0.25 mg (10/11/2018), 0.25 mg (10/25/2018), 0.25 mg (12/01/2018), 0.25 mg (12/15/2018), 0.25 mg (11/17/2018), 0.25 mg (12/29/2018) pegfilgrastim (NEULASTA) injection 6 mg, 6 mg, Subcutaneous, Once, 5 of 5 cycles Administration: 6 mg (10/13/2018), 6 mg (10/27/2018), 6 mg (12/03/2018), 6  mg (12/17/2018), 6 mg (11/19/2018) pegfilgrastim-cbqv (UDENYCA) injection 6 mg, 6 mg, Subcutaneous, Once, 1 of 2 cycles Administration: 6 mg (12/31/2018) leucovorin 628 mg in dextrose 5 % 250 mL infusion, 400 mg/m2 = 628 mg,  Intravenous,  Once, 7 of 8 cycles Administration: 628 mg (09/22/2018), 628 mg (10/11/2018), 628 mg (10/25/2018), 628 mg (12/01/2018), 628 mg (12/15/2018), 628 mg (11/17/2018), 628 mg (12/29/2018) oxaliplatin (ELOXATIN) 100 mg in dextrose 5 % 500 mL chemo infusion, 65 mg/m2 = 100 mg (100 % of original dose 65 mg/m2), Intravenous,  Once, 7 of 8 cycles Dose modification: 65 mg/m2 (original dose 65 mg/m2, Cycle 1, Reason: Provider Judgment) Administration: 100 mg (09/22/2018), 100 mg (10/11/2018), 100 mg (10/25/2018), 100 mg (12/01/2018), 100 mg (12/15/2018), 100 mg (11/17/2018), 100 mg (12/29/2018) fluorouracil (ADRUCIL) chemo injection 500 mg, 320 mg/m2 = 500 mg (100 % of original dose 320 mg/m2), Intravenous,  Once, 7 of 8 cycles Dose modification: 320 mg/m2 (original dose 320 mg/m2, Cycle 1, Reason: Provider Judgment) Administration: 500 mg (09/22/2018), 500 mg (10/11/2018), 500 mg (10/25/2018), 500 mg (12/01/2018), 500 mg (12/15/2018), 500 mg (11/17/2018), 500 mg (12/29/2018) fluorouracil (ADRUCIL) 3,000 mg in sodium chloride 0.9 % 90 mL chemo infusion, 1,920 mg/m2 = 3,000 mg (100 % of original dose 1,920 mg/m2), Intravenous, 1 Day/Dose, 7 of 8 cycles Dose modification: 1,920 mg/m2 (original dose 1,920 mg/m2, Cycle 1, Reason: Provider Judgment) Administration: 3,000 mg (09/22/2018), 3,000 mg (10/11/2018), 3,000 mg (10/25/2018), 3,000 mg (12/01/2018), 3,000 mg (12/15/2018), 3,000 mg (11/17/2018), 3,000 mg (12/29/2018)  for chemotherapy treatment.    12/24/2018 Genetic Testing   Negative genetic testing on the common hereditary cancer panel.  The Common Hereditary Gene Panel offered by [facility] includes sequencing and/or deletion duplication testing of the following 48 genes: APC, ATM, AXIN2, BARD1, BMPR1A, BRCA1, BRCA2, BRIP1, CDH1, CDK4, CDKN2A (p14ARF), CDKN2A (p16INK4a), CHEK2, CTNNA1, DICER1, EPCAM (Deletion/duplication testing only), GREM1 (promoter region deletion/duplication testing only), KIT, MEN1, MLH1, MSH2, MSH3, MSH6, MUTYH, NBN,  NF1, NHTL1, PALB2, PDGFRA, PMS2, POLD1, POLE, PTEN, RAD50, RAD51C, RAD51D, RNF43, SDHB, SDHC, SDHD, SMAD4, SMARCA4. STK11, TP53, TSC1, TSC2, and VHL.  The following genes were evaluated for sequence changes only: SDHA and HOXB13 c.251G>A variant only. The report date is December 24, 2018.  DICER1 VUS identified.  This will not change medical decision making.      CANCER STAGING: Cancer Staging Pancreatic cancer metastasized to liver [facility]) Staging form: Pancreas, AJCC 7th Edition - Clinical: Stage IV (T3, N1, M1) - Signed by Baird Cancer, PA-C on 03/16/2014 - Pathologic: No stage assigned - Unsigned    INTERVAL HISTORY:  [name] 85 y.o. female seen for follow-up of metastatic pancreatic cancer to the liver.  Cycle 7 was on 12/29/2018.  She has mild diarrhea and some leg swelling.  Numbness in the feet has been stable.  Appetite and energy levels are 25%.  Denies any nausea vomiting or constipation.  Denies any fevers or chills.  No infections were reported.  She is drinking Ensure breeze and has gained 1 pound since last visit 2 weeks ago.  She also had CT scans done on 01/10/2019 and wants to discuss results.  REVIEW OF SYSTEMS:  Review of Systems  Cardiovascular: Positive for leg swelling.  Gastrointestinal: Positive for diarrhea.  Skin: Positive for rash.  Neurological: Positive for numbness.  All other systems reviewed and are negative.    PAST MEDICAL/SURGICAL HISTORY:  Past Medical History:  Diagnosis Date  . Anemia due to antineoplastic chemotherapy 09/12/2015   Started  Aranesp 500 mcg on 09/12/2015  . Anxiety   . Chronic diarrhea   . Depression   . DNR (do not resuscitate) 08/16/2015  . Erosive esophagitis   . Family history of breast cancer in female   . Family history of prostate cancer   . GERD (gastroesophageal reflux disease)   . Hx of adenomatous colonic polyps    tubular adenomas, last found in 2008  . Hyperplastic colon polyp 03/19/10   tcs by Dr. Gala Romney  .  Hypertension 20 years   . Kidney stone    hx/ crushed   . Opioid contract exists 04/18/2015   With Dr. Moshe Cipro  . Pancreatic cancer ([place]) 02/2014  . Pancreatic cancer metastasized to liver ([place]) 03/10/2014  . Schatzki's ring 08/26/10   Last dilated on EGD by Dr. Trevor Iha [facility], linear gastric erosions, BI hemigastrectomy   Past Surgical History:  Procedure Laterality Date  . BREAST LUMPECTOMY Left   . bunion removal     from both feet   . CHOLECYSTECTOMY  1965   . COLONOSCOPY  03/19/2010   DR Gala Romney,, normal TI, pancolonic diverticula, random colon bx neg., hyperplastic polyps removed  . ESOPHAGOGASTRODUODENOSCOPY  11/22/2003   DR Gala Romney, erosive RE, Billroth I  . ESOPHAGOGASTRODUODENOSCOPY  08/26/10   Dr. Gala Romney- moderate severe ERE, Scahtzki ring s/p dilation, Billroth I, linear gastric erosions, bx-gastric xanthelasma  . LEFT SHOULDER SURGERY  2009   DR HARRISON  . ORIF ANKLE FRACTURE Right 05/22/2013   Procedure: OPEN REDUCTION INTERNAL FIXATION (ORIF) RIGHT ANKLE FRACTURE;  Surgeon: Sanjuana Kava, MD;  Location: [facility];  Service: Orthopedics;  Laterality: Right;  . stomach ulcer  50 years ago    had some of her stomach removed      SOCIAL HISTORY:  Social History   Socioeconomic History  . Marital status: Widowed    Spouse name: Not on file  . Number of children: 2  . Years of education: 56  . Highest education level: 12th grade  Occupational History  . Occupation: retired from Estate manager/land agent: RETIRED  Social Needs  . Financial resource strain: Not hard at all  . Food insecurity    Worry: Never true    Inability: Never true  . Transportation needs    Medical: No    Non-medical: No  Tobacco Use  . Smoking status: Current Some Day Smoker    Packs/day: 0.50    Years: 60.00    Pack years: 30.00    Types: Cigarettes    Last attempt to quit: 09/09/2016    Years since quitting: 2.3  . Smokeless tobacco: Never Used  Substance and Sexual Activity  . Alcohol use: No   . Drug use: No  . Sexual activity: Not Currently  Lifestyle  . Physical activity    Days per week: 3 days    Minutes per session: 30 min  . Stress: Not at all  Relationships  . Social connections    Talks on phone: More than three times a week    Gets together: Three times a week    Attends religious service: 1 to 4 times per year    Active member of club or organization: No    Attends meetings of clubs or organizations: Never    Relationship status: Widowed  . Intimate partner violence    Fear of current or ex partner: No    Emotionally abused: No    Physically abused: No    Forced sexual activity: No  Other Topics Concern  . Not on file  Social History Narrative   Lives with son alone     FAMILY HISTORY:  Family History  Problem Relation Age of Onset  . Hypertension Mother   . Kidney failure Brother        on dialysis  . Diabetes Sister   . Diabetes Sister   . Hypertension Father   . Kidney failure Sister   . Kidney Stones Brother   . Breast cancer Son 29  . Gout Son 3  . Gout Nephew 57  . Liver disease Neg Hx   . Colon cancer Neg Hx     CURRENT MEDICATIONS:  Outpatient Encounter Medications as of 01/12/2019  Medication Sig  . amLODipine (NORVASC) 10 MG tablet Take 1 tablet (10 mg total) by mouth daily.  . clobetasol (TEMOVATE) 0.05 % external solution APP AA ON SCALP BID PRN NOT TO FACE GROIN OR UNDERARMS  . clonazePAM (KLONOPIN) 0.5 MG tablet Take 1 tablet (0.5 mg total) by mouth at bedtime.  . cycloSPORINE (RESTASIS) 0.05 % ophthalmic emulsion Place 1 drop into both eyes 2 (two) times daily.  . fluorometholone (FML) 0.1 % ophthalmic suspension INSTILL 1 DROP INTO EACH EYE THREE TIMES DAILY FOR 14 DAYS  . metoprolol tartrate (LOPRESSOR) 25 MG tablet Take one tablet once daily for blood pressure  . MYRBETRIQ 50 MG TB24 tablet TAKE 1 TABLET DAILY  . pantoprazole (PROTONIX) 20 MG tablet Take 1 tablet by mouth daily  . acetaminophen (TYLENOL) 500 MG tablet Take  500 mg by mouth every 6 (six) hours as needed for mild pain or moderate pain.   . diphenhydrAMINE (BENADRYL) 50 MG tablet Take 1 tablet 1 hour prior to CT (Patient not taking: Reported on 01/12/2019)  . fluticasone (FLONASE) 50 MCG/ACT nasal spray Place 1 spray into both nostrils daily.  Marland Kitchen lidocaine (XYLOCAINE) 2 % solution RINSE WITH 5ML AS NEEDED TO RELIEVE MOUTH PAIN.  Marland Kitchen lidocaine-prilocaine (EMLA) cream Apply 1 application topically as needed. Apply to portacath site as needed (Patient not taking: Reported on 01/12/2019)  . [DISCONTINUED] fluticasone (CUTIVATE) 0.05 % cream APPLY AA FACE BID PRN  . [DISCONTINUED] prochlorperazine (COMPAZINE) 10 MG tablet Take 1 tablet (10 mg total) by mouth every 6 (six) hours as needed (Nausea or vomiting).   No facility-administered encounter medications on file as of 01/12/2019.     ALLERGIES:  Allergies  Allergen Reactions  . Iohexol Hives and Shortness Of Breath    **Patient can not have IV contrast, pt has breakthrough reaction even after 13 hour premeds** Do not give IV contrast PATIENT HAD TO BE TAKEN TO ED, C/O WHELPS, HIVES, DIFFICULTY BREATHING. PATIENT GIVEN IV CONTRAST AFTER 13 HOUR PRE MEDS ON 01/09/2017 WITH NO REACTION NOTED. On 10/19/17 patient had reaction despite premedication.    Marland Kitchen Aciphex [Rabeprazole Sodium] Other (See Comments)    unknown  . Amlodipine Besylate-Valsartan     Rash to high-dose (5/320)  . Esomeprazole Magnesium   . Omeprazole Other (See Comments)    Patient states "medication didn't work"  . Ciprofloxacin Rash  . Penicillins Swelling and Rash    Has patient had a PCN reaction causing immediate rash, facial/tongue/throat swelling, SOB or lightheadedness with hypotension: Yes Has patient had a PCN reaction causing severe rash involving mucus membranes or skin necrosis: Yes Has patient had a PCN reaction that required hospitalization: No Has patient had a PCN reaction occurring within the last 10 years: yes If all of the  above  answers are "NO", then may proceed with Cephalosporin use.   [name] [Benzonatate] Itching    Full head, face, neck, and chest itching.      PHYSICAL EXAM:  ECOG Performance status: 1  Vitals:   01/12/19 0908 01/12/19 0921  BP: (!) 151/53 (!) 151/53  Pulse: (!) 52 (!) 52  Resp: 18 18  Temp: (!) 97.3 F (36.3 C) (!) 97.3 F (36.3 C)  SpO2: 100% 100%   Filed Weights   01/12/19 0921  Weight: 116 lb 6.4 oz (52.8 kg)    Physical Exam Vitals signs reviewed.  Constitutional:      Appearance: Normal appearance.  Cardiovascular:     Rate and Rhythm: Normal rate and regular rhythm.     Heart sounds: Normal heart sounds.  Pulmonary:     Effort: Pulmonary effort is normal.     Breath sounds: Normal breath sounds.  Abdominal:     General: There is no distension.     Palpations: Abdomen is soft. There is no mass.  Musculoskeletal:        General: No swelling.  Skin:    General: Skin is warm.  Neurological:     General: No focal deficit present.     Mental Status: She is alert and oriented to person, place, and time.  Psychiatric:        Mood and Affect: Mood normal.        Behavior: Behavior normal.      LABORATORY DATA:  I have reviewed the labs as listed.  CBC    Component Value Date/Time   WBC 12.4 (H) 01/12/2019 0905   RBC 3.20 (L) 01/12/2019 0905   HGB 9.7 (L) 01/12/2019 0905   HCT 30.2 (L) 01/12/2019 0905   PLT 98 (L) 01/12/2019 0905   MCV 94.4 01/12/2019 0905   MCH 30.3 01/12/2019 0905   MCHC 32.1 01/12/2019 0905   RDW 18.5 (H) 01/12/2019 0905   LYMPHSABS 1.1 01/12/2019 0905   MONOABS 0.8 01/12/2019 0905   EOSABS 0.2 01/12/2019 0905   BASOSABS 0.0 01/12/2019 0905   CMP Latest Ref Rng & Units 01/12/2019 12/29/2018 12/15/2018  Glucose 70 - 99 mg/dL 109(H) 102(H) 127(H)  BUN 8 - 23 mg/dL 22 19 27(H)  Creatinine 0.44 - 1.00 mg/dL 1.20(H) 1.28(H) 1.20(H)  Sodium 135 - 145 mmol/L 141 139 141  Potassium 3.5 - 5.1 mmol/L 3.3(L) 3.8 3.8   Chloride 98 - 111 mmol/L 113(H) 106 107  CO2 22 - 32 mmol/L _0 Calcium 8.9 - 10.3 mg/dL 8.0(L) 8.5(L) 8.6(L)  Total Protein 6.5 - 8.1 g/dL 7.2 7.2 7.2  Total Bilirubin 0.3 - 1.2 mg/dL 0.6 0.6 0.9  Alkaline Phos 38 - 126 U/L 187(H) 208(H) 199(H)  AST 15 - 41 U/L _1 ALT 0 - 44 U/L _2 DIAGNOSTIC IMAGING:  I have independently reviewed the scans and discussed with the patient.   I have reviewed Venita Lick LPN's note and agree with the documentation.  I personally performed a face-to-face visit, made revisions and my assessment and plan is as follows.    ASSESSMENT & PLAN:   Pancreatic cancer metastasized to liver ([place]) 1.  Metastatic pancreatic cancer to the liver: - Foundation 1 testing shows MS-stable, TMB cannot be determined.  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 - Gemcitabine and Abraxane from November 2015 through August 2018 with progression. -Gemcitabine and Tarceva from 01/28/2017 until 10/07/2017 with  progression. -21 cycles of infusional 5-FU and Onivyde from 10/21/2017 through 09/08/2018 with progression. - 7 cycles of FOLFOX from 09/22/2018 through 12/29/2018. - We reviewed results of the CT CAP on 01/10/2019 which showed increase in size of the dominant hepatic lesion near the dome measuring 4.7 cm, previously 2.6 cm.  Other lesions in the liver are stable.  No new lesions were seen. - CA 19-9 has gone up to 270 on 11/17/2018 from 170 on 08/25/2018. -I discussed the findings with the patient in detail.  She has gone through all of the standard therapy for metastatic pancreatic cancer.  However she has good functional status. - Options include clinical trial and recycling of chemotherapy like gemcitabine or Xeloda. -I will see her back in 2 weeks for follow-up.  I will request her son to be present at next visit.  2.  Weight loss: - She gained 1 pound since last visit 2 weeks ago. -She started drinking Ensure breeze as she could not tolerate regular Ensure.   3.  Genetic testing: -MI mutation testing on 12/24/2018 was negative.  Total time spent is 25 minutes with more than 50% of the time spent face-to-face discussing scan results, counseling and coordination of care.    Orders placed this encounter:  No orders of the defined types were placed in this encounter.     Derek Jack, MD [place] [phone]

## 2019-01-25 ENCOUNTER — Encounter (HOSPITAL_COMMUNITY): Payer: Self-pay | Admitting: Hematology

## 2019-01-25 ENCOUNTER — Other Ambulatory Visit: Payer: Self-pay

## 2019-01-25 ENCOUNTER — Encounter: Payer: Self-pay | Admitting: Hematology

## 2019-01-25 ENCOUNTER — Inpatient Hospital Stay (HOSPITAL_BASED_OUTPATIENT_CLINIC_OR_DEPARTMENT_OTHER): Payer: Medicare HMO | Admitting: Hematology

## 2019-01-25 ENCOUNTER — Encounter (HOSPITAL_COMMUNITY): Payer: Self-pay | Admitting: Lab

## 2019-01-25 DIAGNOSIS — C252 Malignant neoplasm of tail of pancreas: Secondary | ICD-10-CM | POA: Diagnosis not present

## 2019-01-25 DIAGNOSIS — R197 Diarrhea, unspecified: Secondary | ICD-10-CM | POA: Diagnosis not present

## 2019-01-25 DIAGNOSIS — R69 Illness, unspecified: Secondary | ICD-10-CM | POA: Diagnosis not present

## 2019-01-25 DIAGNOSIS — C787 Secondary malignant neoplasm of liver and intrahepatic bile duct: Secondary | ICD-10-CM | POA: Diagnosis not present

## 2019-01-25 DIAGNOSIS — Z803 Family history of malignant neoplasm of breast: Secondary | ICD-10-CM | POA: Diagnosis not present

## 2019-01-25 DIAGNOSIS — C259 Malignant neoplasm of pancreas, unspecified: Secondary | ICD-10-CM

## 2019-01-25 DIAGNOSIS — R634 Abnormal weight loss: Secondary | ICD-10-CM | POA: Diagnosis not present

## 2019-01-25 DIAGNOSIS — Z23 Encounter for immunization: Secondary | ICD-10-CM | POA: Diagnosis not present

## 2019-01-25 DIAGNOSIS — M7989 Other specified soft tissue disorders: Secondary | ICD-10-CM | POA: Diagnosis not present

## 2019-01-25 MED ORDER — INFLUENZA VAC A&B SA ADJ QUAD 0.5 ML IM PRSY
PREFILLED_SYRINGE | INTRAMUSCULAR | Status: AC
  Filled 2019-01-25: qty 0.5

## 2019-01-25 MED ORDER — INFLUENZA VAC A&B SA ADJ QUAD 0.5 ML IM PRSY
0.5000 mL | PREFILLED_SYRINGE | Freq: Once | INTRAMUSCULAR | Status: AC
  Administered 2019-01-25: 0.5 mL via INTRAMUSCULAR

## 2019-01-25 NOTE — Assessment & Plan Note (Addendum)
1.  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 - Gemcitabine and Abraxane from normal 2015 through August 2018 with progression. -Gemcitabine and Tarceva from 01/28/2017 through 10/07/2017 with progression. -21 cycles of infusional 5-FU and Onivyde from 10/21/2017-09/08/2018. -7 cycles of FOLFOX from 09/22/2018-12/29/2018. -CT CAP on 01/10/2019 showed increase in size of dominant hepatic lesion near the dome measuring 4.7 cm, previously 2.6 cm.  Other lesions in the liver are stable.  No new lesions seen.  No lesions in the lungs. - She is accompanied by her granddaughter today.  We discussed thoroughly about her scan results. - We discussed various options including a clinical trial at [facility] for K-ras mutated solid tumors.  She is not interested in going there. - We have discussed best supportive care in the form of hospice.  Patient is currently not in any pain and is able to do all her activities.  She does have good performance status.  She wants to try any available chemotherapy options. - I have recommended guardant 360 for mutation testing. - Options include recycling gemcitabine as she had gotten good response with it for almost 4 years.  We can combine it with cisplatin, however her kidney function is not perfect.  Combining with Xeloda is an option. - We may start with single agent gemcitabine next week. -I have also made a referral to palliative care.  2.  Weight loss: -She gained 1 pound since last visit 2 weeks ago. -She really like drinking Ensure breeze but reportedly developed rash on the chest and lower back when she drank 2 bottles a day.  She is currently eating 3 meals a day.  3.  Genetic testing: -Germline mutation testing was negative on 12/24/2018.

## 2019-01-25 NOTE — Patient Instructions (Addendum)
[place] at [facility] Discharge Instructions  You were seen today by Dr. Delton Coombes. He went over your recent lab and scan results. He discussed your cancer and what options are left for treatment. There are clinical trials the closet being [facility]. He discussed recycling a treatment regimen she has received previously and will repeat scans in 2-3 months.  He will see you back in 1 week for labs and follow up.   Thank you for choosing [place] at [facility] to provide your oncology and hematology care.  To afford each patient quality time with our provider, please arrive at least 15 minutes before your scheduled appointment time.   If you have a lab appointment with the [facility] please come in thru the  Main Entrance and check in at the main information desk  You need to re-schedule your appointment should you arrive 10 or more minutes late.  We strive to give you quality time with our providers, and arriving late affects you and other patients whose appointments are after yours.  Also, if you no show three or more times for appointments you may be dismissed from the clinic at the providers discretion.     Again, thank you for choosing [facility].  Our hope is that these requests will decrease the amount of time that you wait before being seen by our physicians.       _____________________________________________________________  Should you have questions after your visit to [facility], please contact our office at [phone] between the hours of 8:00 a.m. and 4:30 p.m.  Voicemails left after 4:00 p.m. will not be returned until the following business day.  For prescription refill requests, have your pharmacy contact our office and allow 72 hours.    Cancer Center Support Programs:   > Cancer Support Group  2nd Tuesday of the month 1pm-2pm, Journey Room

## 2019-01-25 NOTE — Progress Notes (Signed)
[place], [place] [address]   CLINIC:  Medical Oncology/Hematology  PCP:  Fayrene Helper, MD [address], [place] [address] [phone]   REASON FOR VISIT:  Follow-up for metastatic pancreatic cancer to the liver   BRIEF ONCOLOGIC HISTORY:  Oncology History  Pancreatic cancer metastasized to liver ([place])  03/10/2014 Initial Diagnosis   Pancreatic cancer metastasized to liver   03/22/2014 - 03/05/2016 Chemotherapy   Abraxane/Gemzar days 1, 8, every 28 days.  Day 15 was cancelled due to leukopenia and thrombocytopenia on day 15 cycle 1.   05/24/2014 Treatment Plan Change   Day 8 of cycle 3 is held with ANC of 1.1   06/05/2014 Imaging   CT C/A/P . Interval decrease in size of the pancreatic tail mass.Improved hepatic metastatic disease. No new lesions. No CT findings for metastatic disease involving the chest.   08/09/2014 Tumor Marker   CA 19-9= 33 (WNL)   10/26/2014 Imaging   MRI- Continued interval decrease in size of the hepatic metastatic lesions and no new lesions are identified. Continued decrease in size of the pancreatic tail lesion.   01/03/2015 Tumor Marker   Results for [name], [name] (MRN [id number]) as of 01/18/2015 12:52  01/03/2015 10:00 CA 19-9: 14    01/17/2015 Imaging   MRI- Response to therapy of hepatic metastasis.  Similar size of a pancreatic tail lesion.   03/20/2015 Imaging   MRI-L spine- Severe disc space narrowing at L2-L3, with endplate reactive changes. Large disc extrusion into the ventral epidural space,central to the RIGHT with a cephalad migrated free fragment. Additional Large disc extrusion into the retroperitone...   03/22/2015 Imaging   CT pelvis- No evidence for metastatic disease within the pelvis.   07/24/2015 Imaging   MRI abd- Continued response to therapy, with no residual detectable liver metastases. No new sites of metastatic disease in the abdomen.    08/15/2015 Code Status     She confirms desire for DNR status.   12/21/2015 Imaging   MRI liver- Severe image degradation due to motion artifact reducing diagnostic sensitivity and specificity. Reduce conspicuity of the pancreatic tail lesion suggesting further improvement. The original liver lesions have resolved.   03/05/2016 Treatment Plan Change   Chemotherapy holiday/Break after 2 years worth of treatment   04/07/2016 Imaging   CT chest- When compared to recent chest CT, new minimally displaced anterior left sixth rib fracture. Slight increase in subcarinal adenopathy   04/28/2016 Imaging   Bone density- BMD as determined from Femur Neck Right is 0.705 g/cm2 with a T-Score of -2.4. This patient is considered osteopenic according to [facility]) criteria. Compared with the prior study on 02/23/2013, the BMD of the lumbar spine/rt. femoral neck show a statistically significant decrease.    05/23/2016 Imaging   MRI abd- 1. Exam is significantly degraded by patient respiratory motion. Consider follow-up exams with CT abdomen with without contrast per pancreatic protocol. 2. Fullness in tail of pancreas is more prominent and potentially increased in size. Recommend close attention on follow-up. (Consider CT as above) 3. No explanation for back pain.   09/11/2016 Imaging   MRI pancreas- Interval progression of pancreatic tail lesion. New differential perfusion right liver with areas of heterogeneity. Underlying metastatic disease in this region not excluded.   09/11/2016 Progression   MRi in conjunction with rising CA 19-9 are indicative of relapse of disease.   01/09/2017 Progression   CT C/A/P: 2.1 x 2.9  cm lesion in the pancreatic tail and abutting the splenic hilum, corresponding to known primary pancreatic neoplasm, mildly increased.  Scattered small hypoenhancing lesions in the liver measuring up to 10 mm, approximately 8-10 in number, suspicious for hepatic metastases.  No  findings specific for metastatic disease in the chest.  Additional ancillary findings as above.    01/27/2017 - 10/20/2017 Chemotherapy   The patient had gemcitabine (GEMZAR) 1,292 mg in sodium chloride 0.9 % 250 mL chemo infusion, 800 mg/m2 = 1,292 mg, Intravenous,  Once, 9 of 12 cycles Administration: 1,292 mg (01/28/2017), 1,292 mg (02/11/2017), 1,292 mg (02/25/2017), 1,292 mg (03/11/2017), 1,292 mg (03/25/2017), 1,292 mg (04/08/2017), 1,292 mg (04/22/2017), 1,292 mg (04/29/2017), 1,292 mg (05/20/2017), 1,292 mg (06/03/2017), 1,292 mg (06/24/2017), 1,292 mg (07/08/2017), 1,292 mg (07/29/2017), 1,292 mg (08/12/2017), 1,292 mg (08/26/2017), 1,292 mg (09/09/2017), 1,292 mg (09/23/2017), 1,292 mg (10/07/2017)  for chemotherapy treatment.    10/21/2017 - 09/22/2018 Chemotherapy   The patient had leucovorin 700 mg in dextrose 5 % 250 mL infusion, 644 mg, Intravenous,  Once, 22 of 22 cycles Administration: 700 mg (10/21/2017), 700 mg (11/04/2017), 700 mg (11/18/2017), 700 mg (12/02/2017), 700 mg (12/16/2017), 700 mg (12/30/2017), 700 mg (01/13/2018), 700 mg (01/27/2018), 700 mg (02/10/2018), 700 mg (02/24/2018), 700 mg (03/10/2018), 700 mg (03/24/2018), 700 mg (04/28/2018), 700 mg (05/17/2018), 700 mg (05/31/2018), 700 mg (06/16/2018), 700 mg (06/30/2018), 700 mg (07/21/2018), 700 mg (08/04/2018), 700 mg (08/25/2018), 700 mg (09/08/2018) ondansetron (ZOFRAN) 8 mg in sodium chloride 0.9 % 50 mL IVPB, , Intravenous,  Once, 21 of 21 cycles Administration:  (11/04/2017),  (01/27/2018),  (02/10/2018),  (02/24/2018),  (03/10/2018),  (03/24/2018),  (04/28/2018),  (05/17/2018),  (05/31/2018),  (06/16/2018),  (06/30/2018),  (07/21/2018),  (08/04/2018),  (08/25/2018),  (09/08/2018) fluorouracil (ADRUCIL) 3,850 mg in sodium chloride 0.9 % 73 mL chemo infusion, 2,400 mg/m2 = 3,850 mg, Intravenous, 1 Day/Dose, 22 of 22 cycles Dose modification: 1,920 mg/m2 (original dose 2,400 mg/m2, Cycle 2, Reason: Provider Judgment, Comment: mucisitis) Administration: 3,850 mg  (10/21/2017), 3,100 mg (11/04/2017), 3,100 mg (11/18/2017), 3,100 mg (12/02/2017), 3,100 mg (12/16/2017), 3,100 mg (12/30/2017), 3,100 mg (01/13/2018), 3,100 mg (01/27/2018), 3,100 mg (02/10/2018), 3,100 mg (02/24/2018), 3,100 mg (03/10/2018), 3,100 mg (03/24/2018), 3,100 mg (04/28/2018), 3,100 mg (05/17/2018), 3,100 mg (05/31/2018), 3,100 mg (06/16/2018), 3,100 mg (06/30/2018), 3,100 mg (07/21/2018), 3,100 mg (08/04/2018), 3,100 mg (08/25/2018), 3,100 mg (09/08/2018) irinotecan LIPOSOME (ONIVYDE) 81.7 mg in sodium chloride 0.9 % 500 mL chemo infusion, 50 mg/m2 = 81.7 mg (100 % of original dose 50 mg/m2), Intravenous, Once, 22 of 22 cycles Dose modification: 50 mg/m2 (original dose 50 mg/m2, Cycle 1, Reason: Provider Judgment), 50 mg/m2 (original dose 50 mg/m2, Cycle 2, Reason: Provider Judgment) Administration: 81.7 mg (10/21/2017), 81.7 mg (11/04/2017), 81.7 mg (11/18/2017), 81.7 mg (12/02/2017), 81.7 mg (12/16/2017), 81.7 mg (12/30/2017), 81.7 mg (01/13/2018), 81.7 mg (01/27/2018), 81.7 mg (02/10/2018), 81.7 mg (02/24/2018), 81.7 mg (03/10/2018), 81.7 mg (03/24/2018), 81.7 mg (04/28/2018), 81.7 mg (05/17/2018), 81.7 mg (05/31/2018), 81.7 mg (06/16/2018), 81.7 mg (06/30/2018), 81.7 mg (07/21/2018), 81.7 mg (08/04/2018), 81.7 mg (08/25/2018), 81.7 mg (09/08/2018)  for chemotherapy treatment.    09/22/2018 -  Chemotherapy   The patient had palonosetron (ALOXI) injection 0.25 mg, 0.25 mg, Intravenous,  Once, 7 of 8 cycles Administration: 0.25 mg (09/22/2018), 0.25 mg (10/11/2018), 0.25 mg (10/25/2018), 0.25 mg (12/01/2018), 0.25 mg (12/15/2018), 0.25 mg (11/17/2018), 0.25 mg (12/29/2018) pegfilgrastim (NEULASTA) injection 6 mg, 6 mg, Subcutaneous, Once, 5 of 5 cycles Administration: 6 mg (10/13/2018), 6 mg (10/27/2018), 6 mg (12/03/2018), 6  mg (12/17/2018), 6 mg (11/19/2018) pegfilgrastim-cbqv (UDENYCA) injection 6 mg, 6 mg, Subcutaneous, Once, 1 of 2 cycles Administration: 6 mg (12/31/2018) leucovorin 628 mg in dextrose 5 % 250 mL infusion, 400 mg/m2 = 628 mg,  Intravenous,  Once, 7 of 8 cycles Administration: 628 mg (09/22/2018), 628 mg (10/11/2018), 628 mg (10/25/2018), 628 mg (12/01/2018), 628 mg (12/15/2018), 628 mg (11/17/2018), 628 mg (12/29/2018) oxaliplatin (ELOXATIN) 100 mg in dextrose 5 % 500 mL chemo infusion, 65 mg/m2 = 100 mg (100 % of original dose 65 mg/m2), Intravenous,  Once, 7 of 8 cycles Dose modification: 65 mg/m2 (original dose 65 mg/m2, Cycle 1, Reason: Provider Judgment) Administration: 100 mg (09/22/2018), 100 mg (10/11/2018), 100 mg (10/25/2018), 100 mg (12/01/2018), 100 mg (12/15/2018), 100 mg (11/17/2018), 100 mg (12/29/2018) fluorouracil (ADRUCIL) chemo injection 500 mg, 320 mg/m2 = 500 mg (100 % of original dose 320 mg/m2), Intravenous,  Once, 7 of 8 cycles Dose modification: 320 mg/m2 (original dose 320 mg/m2, Cycle 1, Reason: Provider Judgment) Administration: 500 mg (09/22/2018), 500 mg (10/11/2018), 500 mg (10/25/2018), 500 mg (12/01/2018), 500 mg (12/15/2018), 500 mg (11/17/2018), 500 mg (12/29/2018) fluorouracil (ADRUCIL) 3,000 mg in sodium chloride 0.9 % 90 mL chemo infusion, 1,920 mg/m2 = 3,000 mg (100 % of original dose 1,920 mg/m2), Intravenous, 1 Day/Dose, 7 of 8 cycles Dose modification: 1,920 mg/m2 (original dose 1,920 mg/m2, Cycle 1, Reason: Provider Judgment) Administration: 3,000 mg (09/22/2018), 3,000 mg (10/11/2018), 3,000 mg (10/25/2018), 3,000 mg (12/01/2018), 3,000 mg (12/15/2018), 3,000 mg (11/17/2018), 3,000 mg (12/29/2018)  for chemotherapy treatment.    12/24/2018 Genetic Testing   Negative genetic testing on the common hereditary cancer panel.  The Common Hereditary Gene Panel offered by [facility] includes sequencing and/or deletion duplication testing of the following 48 genes: APC, ATM, AXIN2, BARD1, BMPR1A, BRCA1, BRCA2, BRIP1, CDH1, CDK4, CDKN2A (p14ARF), CDKN2A (p16INK4a), CHEK2, CTNNA1, DICER1, EPCAM (Deletion/duplication testing only), GREM1 (promoter region deletion/duplication testing only), KIT, MEN1, MLH1, MSH2, MSH3, MSH6, MUTYH, NBN,  NF1, NHTL1, PALB2, PDGFRA, PMS2, POLD1, POLE, PTEN, RAD50, RAD51C, RAD51D, RNF43, SDHB, SDHC, SDHD, SMAD4, SMARCA4. STK11, TP53, TSC1, TSC2, and VHL.  The following genes were evaluated for sequence changes only: SDHA and HOXB13 c.251G>A variant only. The report date is December 24, 2018.  DICER1 VUS identified.  This will not change medical decision making.      CANCER STAGING: Cancer Staging Pancreatic cancer metastasized to liver [facility]) Staging form: Pancreas, AJCC 7th Edition - Clinical: Stage IV (T3, N1, M1) - Signed by Baird Cancer, PA-C on 03/16/2014 - Pathologic: No stage assigned - Unsigned    INTERVAL HISTORY:  Ms. Tanney 85 y.o. female seen for follow-up of metastatic pancreatic cancer to the liver.  She had progressed on the most latest regimen of FOLFOX.  She is eating 3 meals a day.  She is accompanied by her granddaughter.  She has some numbness in the feet which is stable.  Appetite is 50%.  Energy levels are 25%.  She is stopped drinking Ensure breeze as she got a rash on the upper chest and lower back when she increased to 2 cans/day.  However she likes drinking Ensure breeze and started back on 1 can last night.  REVIEW OF SYSTEMS:  Review of Systems  Neurological: Positive for numbness.  All other systems reviewed and are negative.    PAST MEDICAL/SURGICAL HISTORY:  Past Medical History:  Diagnosis Date  . Anemia due to antineoplastic chemotherapy 09/12/2015   Started Aranesp 500 mcg on 09/12/2015  . Anxiety   .  Chronic diarrhea   . Depression   . DNR (do not resuscitate) 08/16/2015  . Erosive esophagitis   . Family history of breast cancer in female   . Family history of prostate cancer   . GERD (gastroesophageal reflux disease)   . Hx of adenomatous colonic polyps    tubular adenomas, last found in 2008  . Hyperplastic colon polyp 03/19/10   tcs by Dr. Gala Romney  . Hypertension 20 years   . Kidney stone    hx/ crushed   . Opioid contract exists 04/18/2015    With Dr. Moshe Cipro  . Pancreatic cancer ([place]) 02/2014  . Pancreatic cancer metastasized to liver ([place]) 03/10/2014  . Schatzki's ring 08/26/10   Last dilated on EGD by Dr. Trevor Iha [facility], linear gastric erosions, BI hemigastrectomy   Past Surgical History:  Procedure Laterality Date  . BREAST LUMPECTOMY Left   . bunion removal     from both feet   . CHOLECYSTECTOMY  1965   . COLONOSCOPY  03/19/2010   DR Gala Romney,, normal TI, pancolonic diverticula, random colon bx neg., hyperplastic polyps removed  . ESOPHAGOGASTRODUODENOSCOPY  11/22/2003   DR Gala Romney, erosive RE, Billroth I  . ESOPHAGOGASTRODUODENOSCOPY  08/26/10   Dr. Gala Romney- moderate severe ERE, Scahtzki ring s/p dilation, Billroth I, linear gastric erosions, bx-gastric xanthelasma  . LEFT SHOULDER SURGERY  2009   DR HARRISON  . ORIF ANKLE FRACTURE Right 05/22/2013   Procedure: OPEN REDUCTION INTERNAL FIXATION (ORIF) RIGHT ANKLE FRACTURE;  Surgeon: Sanjuana Kava, MD;  Location: [facility];  Service: Orthopedics;  Laterality: Right;  . stomach ulcer  50 years ago    had some of her stomach removed      SOCIAL HISTORY:  Social History   Socioeconomic History  . Marital status: Widowed    Spouse name: Not on file  . Number of children: 2  . Years of education: 29  . Highest education level: 12th grade  Occupational History  . Occupation: retired from Estate manager/land agent: RETIRED  Social Needs  . Financial resource strain: Not hard at all  . Food insecurity    Worry: Never true    Inability: Never true  . Transportation needs    Medical: No    Non-medical: No  Tobacco Use  . Smoking status: Current Some Day Smoker    Packs/day: 0.50    Years: 60.00    Pack years: 30.00    Types: Cigarettes    Last attempt to quit: 09/09/2016    Years since quitting: 2.3  . Smokeless tobacco: Never Used  Substance and Sexual Activity  . Alcohol use: No  . Drug use: No  . Sexual activity: Not Currently  Lifestyle  . Physical activity    Days  per week: 3 days    Minutes per session: 30 min  . Stress: Not at all  Relationships  . Social connections    Talks on phone: More than three times a week    Gets together: Three times a week    Attends religious service: 1 to 4 times per year    Active member of club or organization: No    Attends meetings of clubs or organizations: Never    Relationship status: Widowed  . Intimate partner violence    Fear of current or ex partner: No    Emotionally abused: No    Physically abused: No    Forced sexual activity: No  Other Topics Concern  . Not on file  Social  History Narrative   Lives with son alone     FAMILY HISTORY:  Family History  Problem Relation Age of Onset  . Hypertension Mother   . Kidney failure Brother        on dialysis  . Diabetes Sister   . Diabetes Sister   . Hypertension Father   . Kidney failure Sister   . Kidney Stones Brother   . Breast cancer Son 15  . Gout Son 38  . Gout Nephew 57  . Liver disease Neg Hx   . Colon cancer Neg Hx     CURRENT MEDICATIONS:  Outpatient Encounter Medications as of 01/25/2019  Medication Sig  . acetaminophen (TYLENOL) 500 MG tablet Take 500 mg by mouth every 6 (six) hours as needed for mild pain or moderate pain.   Marland Kitchen amLODipine (NORVASC) 10 MG tablet Take 1 tablet (10 mg total) by mouth daily.  . clobetasol (TEMOVATE) 0.05 % external solution APP AA ON SCALP BID PRN NOT TO FACE GROIN OR UNDERARMS  . clonazePAM (KLONOPIN) 0.5 MG tablet Take 1 tablet (0.5 mg total) by mouth at bedtime.  . cycloSPORINE (RESTASIS) 0.05 % ophthalmic emulsion Place 1 drop into both eyes 2 (two) times daily.  . diphenhydrAMINE (BENADRYL) 50 MG tablet Take 1 tablet 1 hour prior to CT  . fluorometholone (FML) 0.1 % ophthalmic suspension INSTILL 1 DROP INTO EACH EYE THREE TIMES DAILY FOR 14 DAYS  . fluticasone (FLONASE) 50 MCG/ACT nasal spray Place 1 spray into both nostrils daily.  Marland Kitchen lidocaine (XYLOCAINE) 2 % solution RINSE WITH 5ML AS NEEDED  TO RELIEVE MOUTH PAIN.  Marland Kitchen lidocaine-prilocaine (EMLA) cream Apply 1 application topically as needed. Apply to portacath site as needed  . metoprolol tartrate (LOPRESSOR) 25 MG tablet Take one tablet once daily for blood pressure  . MYRBETRIQ 50 MG TB24 tablet TAKE 1 TABLET DAILY  . pantoprazole (PROTONIX) 20 MG tablet Take 1 tablet by mouth daily  . [DISCONTINUED] prochlorperazine (COMPAZINE) 10 MG tablet Take 1 tablet (10 mg total) by mouth every 6 (six) hours as needed (Nausea or vomiting).  . [EXPIRED] influenza vaccine adjuvanted (FLUAD) injection 0.5 mL    No facility-administered encounter medications on file as of 01/25/2019.     ALLERGIES:  Allergies  Allergen Reactions  . Iohexol Hives and Shortness Of Breath    **Patient can not have IV contrast, pt has breakthrough reaction even after 13 hour premeds** Do not give IV contrast PATIENT HAD TO BE TAKEN TO ED, C/O WHELPS, HIVES, DIFFICULTY BREATHING. PATIENT GIVEN IV CONTRAST AFTER 13 HOUR PRE MEDS ON 01/09/2017 WITH NO REACTION NOTED. On 10/19/17 patient had reaction despite premedication.    Marland Kitchen Aciphex [Rabeprazole Sodium] Other (See Comments)    unknown  . Amlodipine Besylate-Valsartan     Rash to high-dose (5/320)  . Esomeprazole Magnesium   . Omeprazole Other (See Comments)    Patient states "medication didn't work"  . Ciprofloxacin Rash  . Penicillins Swelling and Rash    Has patient had a PCN reaction causing immediate rash, facial/tongue/throat swelling, SOB or lightheadedness with hypotension: Yes Has patient had a PCN reaction causing severe rash involving mucus membranes or skin necrosis: Yes Has patient had a PCN reaction that required hospitalization: No Has patient had a PCN reaction occurring within the last 10 years: yes If all of the above answers are "NO", then may proceed with Cephalosporin use.   [name] [Benzonatate] Itching    Full head, face, neck, and chest  itching.      PHYSICAL EXAM:  ECOG  Performance status: 1  Vitals:   01/25/19 1100  BP: 131/60  Pulse: (!) 55  Resp: 16  Temp: (!) 96.9 F (36.1 C)  SpO2: 100%   Filed Weights   01/25/19 1100  Weight: 117 lb 6 oz (53.2 kg)    Physical Exam Vitals signs reviewed.  Constitutional:      Appearance: Normal appearance.  Cardiovascular:     Rate and Rhythm: Normal rate and regular rhythm.     Heart sounds: Normal heart sounds.  Pulmonary:     Effort: Pulmonary effort is normal.     Breath sounds: Normal breath sounds.  Abdominal:     General: There is no distension.     Palpations: Abdomen is soft. There is no mass.  Musculoskeletal:        General: No swelling.  Skin:    General: Skin is warm.  Neurological:     General: No focal deficit present.     Mental Status: She is alert and oriented to person, place, and time.  Psychiatric:        Mood and Affect: Mood normal.        Behavior: Behavior normal.      LABORATORY DATA:  I have reviewed the labs as listed.  CBC    Component Value Date/Time   WBC 12.4 (H) 01/12/2019 0905   RBC 3.20 (L) 01/12/2019 0905   HGB 9.7 (L) 01/12/2019 0905   HCT 30.2 (L) 01/12/2019 0905   PLT 98 (L) 01/12/2019 0905   MCV 94.4 01/12/2019 0905   MCH 30.3 01/12/2019 0905   MCHC 32.1 01/12/2019 0905   RDW 18.5 (H) 01/12/2019 0905   LYMPHSABS 1.1 01/12/2019 0905   MONOABS 0.8 01/12/2019 0905   EOSABS 0.2 01/12/2019 0905   BASOSABS 0.0 01/12/2019 0905   CMP Latest Ref Rng & Units 01/12/2019 12/29/2018 12/15/2018  Glucose 70 - 99 mg/dL 109(H) 102(H) 127(H)  BUN 8 - 23 mg/dL 22 19 27(H)  Creatinine 0.44 - 1.00 mg/dL 1.20(H) 1.28(H) 1.20(H)  Sodium 135 - 145 mmol/L 141 139 141  Potassium 3.5 - 5.1 mmol/L 3.3(L) 3.8 3.8  Chloride 98 - 111 mmol/L 113(H) 106 107  CO2 22 - 32 mmol/L _0 Calcium 8.9 - 10.3 mg/dL 8.0(L) 8.5(L) 8.6(L)  Total Protein 6.5 - 8.1 g/dL 7.2 7.2 7.2  Total Bilirubin 0.3 - 1.2 mg/dL 0.6 0.6 0.9  Alkaline Phos 38 - 126 U/L 187(H) 208(H) 199(H)   AST 15 - 41 U/L _1 ALT 0 - 44 U/L _2 DIAGNOSTIC IMAGING:  I have independently reviewed the scans and discussed with the patient.   I have reviewed Venita Lick LPN's note and agree with the documentation.  I personally performed a face-to-face visit, made revisions and my assessment and plan is as follows.    ASSESSMENT & PLAN:   Pancreatic cancer metastasized to liver ([place]) 1.  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 - Gemcitabine and Abraxane from normal 2015 through August 2018 with progression. -Gemcitabine and Tarceva from 01/28/2017 through 10/07/2017 with progression. -21 cycles of infusional 5-FU and Onivyde from 10/21/2017-09/08/2018. -7 cycles of FOLFOX from 09/22/2018-12/29/2018. -CT CAP on 01/10/2019 showed increase in size of dominant hepatic lesion near the dome measuring 4.7 cm, previously 2.6 cm.  Other  lesions in the liver are stable.  No new lesions seen.  No lesions in the lungs. - She is accompanied by her granddaughter today.  We discussed thoroughly about her scan results. - We discussed various options including a clinical trial at [facility] for K-ras mutated solid tumors.  She is not interested in going there. - We have discussed best supportive care in the form of hospice.  Patient is currently not in any pain and is able to do all her activities.  She does have good performance status.  She wants to try any available chemotherapy options. - I have recommended guardant 360 for mutation testing. - Options include recycling gemcitabine as she had gotten good response with it for almost 4 years.  We can combine it with cisplatin, however her kidney function is not perfect.  Combining with Xeloda is an option. - We may start with single agent gemcitabine next week. -I have also made a referral to palliative care.  2.  Weight loss: -She gained 1  pound since last visit 2 weeks ago. -She really like drinking Ensure breeze but reportedly developed rash on the chest and lower back when she drank 2 bottles a day.  She is currently eating 3 meals a day.  3.  Genetic testing: -Germline mutation testing was negative on 12/24/2018.  Total time spent is 40 minutes with more than 50% of the time spent face-to-face discussing scan results, treatment plan, counseling and coordination of care.    Orders placed this encounter:  No orders of the defined types were placed in this encounter.     Derek Jack, MD [place] [phone]

## 2019-01-25 NOTE — Progress Notes (Unsigned)
Referral sent to [facility] and Palliative Care.  Records faxed on 9/15

## 2019-01-31 ENCOUNTER — Telehealth: Payer: Self-pay | Admitting: *Deleted

## 2019-01-31 NOTE — Telephone Encounter (Signed)
[name] with [facility] called about pt the onocologist wanted a referral to hospice but wants the primary to be the attending. If Dr Moshe Cipro is in a agreement a verbal order will be accepted at [id number]

## 2019-01-31 NOTE — Telephone Encounter (Signed)
I reviewed the Oncology note in 01/2019. the referral is to Palliative, not hospice care, please let hospice know this, she is stillgoing to be treated for cancer locally per the last visit and has fairly good level of function at this time

## 2019-02-01 ENCOUNTER — Telehealth (HOSPITAL_COMMUNITY): Payer: Self-pay | Admitting: *Deleted

## 2019-02-01 ENCOUNTER — Telehealth: Payer: Self-pay | Admitting: Family Medicine

## 2019-02-01 NOTE — Telephone Encounter (Signed)
Please call regarding the pt

## 2019-02-01 NOTE — Telephone Encounter (Signed)
Left message on [name]'s vm with information from [place]. let her know she could call the office back at anytime and left the office number.

## 2019-02-01 NOTE — Telephone Encounter (Signed)
The referral office from [facility] called and left a VM stating this pt refused hospice services at this time.

## 2019-02-01 NOTE — Telephone Encounter (Signed)
Please see previous telephone note

## 2019-02-01 NOTE — Telephone Encounter (Signed)
Spoke with [name] and verified oncology did make referral for pallative care. The referral they completed was for Hospice. She will contact them and ask them for the order for pallative care.

## 2019-02-02 ENCOUNTER — Other Ambulatory Visit: Payer: Self-pay | Admitting: Family Medicine

## 2019-02-02 ENCOUNTER — Encounter (HOSPITAL_COMMUNITY): Payer: Self-pay | Admitting: *Deleted

## 2019-02-02 NOTE — Progress Notes (Signed)
We received notification from [facility] that the lab wanted to perform additional quality checks on patient's sample.  This will delay her results by 7 calendar days.

## 2019-02-03 ENCOUNTER — Other Ambulatory Visit (HOSPITAL_COMMUNITY): Payer: Medicare HMO

## 2019-02-03 ENCOUNTER — Ambulatory Visit (HOSPITAL_COMMUNITY): Payer: Medicare HMO | Admitting: Hematology

## 2019-02-03 ENCOUNTER — Ambulatory Visit (HOSPITAL_COMMUNITY): Payer: Medicare HMO

## 2019-02-04 DIAGNOSIS — C787 Secondary malignant neoplasm of liver and intrahepatic bile duct: Secondary | ICD-10-CM | POA: Diagnosis not present

## 2019-02-04 DIAGNOSIS — C259 Malignant neoplasm of pancreas, unspecified: Secondary | ICD-10-CM | POA: Diagnosis not present

## 2019-02-10 ENCOUNTER — Inpatient Hospital Stay (HOSPITAL_COMMUNITY): Payer: Medicare HMO

## 2019-02-10 ENCOUNTER — Encounter (HOSPITAL_COMMUNITY): Payer: Self-pay | Admitting: Hematology

## 2019-02-10 ENCOUNTER — Inpatient Hospital Stay (HOSPITAL_COMMUNITY): Payer: Medicare HMO | Attending: Hematology

## 2019-02-10 ENCOUNTER — Other Ambulatory Visit: Payer: Self-pay

## 2019-02-10 ENCOUNTER — Inpatient Hospital Stay (HOSPITAL_BASED_OUTPATIENT_CLINIC_OR_DEPARTMENT_OTHER): Payer: Medicare HMO | Admitting: Hematology

## 2019-02-10 VITALS — BP 162/53 | HR 53 | Temp 96.8°F | Resp 18

## 2019-02-10 DIAGNOSIS — Z5189 Encounter for other specified aftercare: Secondary | ICD-10-CM | POA: Diagnosis not present

## 2019-02-10 DIAGNOSIS — C252 Malignant neoplasm of tail of pancreas: Secondary | ICD-10-CM | POA: Insufficient documentation

## 2019-02-10 DIAGNOSIS — C259 Malignant neoplasm of pancreas, unspecified: Secondary | ICD-10-CM

## 2019-02-10 DIAGNOSIS — C787 Secondary malignant neoplasm of liver and intrahepatic bile duct: Secondary | ICD-10-CM | POA: Diagnosis not present

## 2019-02-10 DIAGNOSIS — Z5111 Encounter for antineoplastic chemotherapy: Secondary | ICD-10-CM | POA: Diagnosis not present

## 2019-02-10 LAB — COMPREHENSIVE METABOLIC PANEL
ALT: 13 U/L (ref 0–44)
AST: 18 U/L (ref 15–41)
Albumin: 3.8 g/dL (ref 3.5–5.0)
Alkaline Phosphatase: 136 U/L — ABNORMAL HIGH (ref 38–126)
Anion gap: 11 (ref 5–15)
BUN: 26 mg/dL — ABNORMAL HIGH (ref 8–23)
CO2: 25 mmol/L (ref 22–32)
Calcium: 8.6 mg/dL — ABNORMAL LOW (ref 8.9–10.3)
Chloride: 105 mmol/L (ref 98–111)
Creatinine, Ser: 1.21 mg/dL — ABNORMAL HIGH (ref 0.44–1.00)
GFR calc Af Amer: 46 mL/min — ABNORMAL LOW (ref >60)
GFR calc non Af Amer: 40 mL/min — ABNORMAL LOW (ref >60)
Glucose, Bld: 112 mg/dL — ABNORMAL HIGH (ref 70–99)
Potassium: 3.9 mmol/L (ref 3.5–5.1)
Sodium: 141 mmol/L (ref 135–145)
Total Bilirubin: 0.8 mg/dL (ref 0.3–1.2)
Total Protein: 7.7 g/dL (ref 6.5–8.1)

## 2019-02-10 LAB — CBC WITH DIFFERENTIAL/PLATELET
Abs Immature Granulocytes: 0.01 10*3/uL (ref 0.00–0.07)
Basophils Absolute: 0 10*3/uL (ref 0.0–0.1)
Basophils Relative: 0 %
Eosinophils Absolute: 0.2 10*3/uL (ref 0.0–0.5)
Eosinophils Relative: 4 %
HCT: 32.7 % — ABNORMAL LOW (ref 36.0–46.0)
Hemoglobin: 10.7 g/dL — ABNORMAL LOW (ref 12.0–15.0)
Immature Granulocytes: 0 %
Lymphocytes Relative: 19 %
Lymphs Abs: 1 10*3/uL (ref 0.7–4.0)
MCH: 30.5 pg (ref 26.0–34.0)
MCHC: 32.7 g/dL (ref 30.0–36.0)
MCV: 93.2 fL (ref 80.0–100.0)
Monocytes Absolute: 0.4 10*3/uL (ref 0.1–1.0)
Monocytes Relative: 7 %
Neutro Abs: 3.8 10*3/uL (ref 1.7–7.7)
Neutrophils Relative %: 70 %
Platelets: 114 10*3/uL — ABNORMAL LOW (ref 150–400)
RBC: 3.51 MIL/uL — ABNORMAL LOW (ref 3.87–5.11)
RDW: 16.4 % — ABNORMAL HIGH (ref 11.5–15.5)
WBC: 5.4 10*3/uL (ref 4.0–10.5)
nRBC: 0 % (ref 0.0–0.2)

## 2019-02-10 LAB — LACTATE DEHYDROGENASE: LDH: 135 U/L (ref 98–192)

## 2019-02-10 LAB — MAGNESIUM: Magnesium: 2.2 mg/dL (ref 1.7–2.4)

## 2019-02-10 MED ORDER — SODIUM CHLORIDE 0.9 % IV SOLN
20.0000 mg/m2 | Freq: Once | INTRAVENOUS | Status: AC
  Administered 2019-02-10: 31 mg via INTRAVENOUS
  Filled 2019-02-10: qty 31

## 2019-02-10 MED ORDER — SODIUM CHLORIDE 0.9 % IV SOLN
Freq: Once | INTRAVENOUS | Status: AC
  Administered 2019-02-10: 10:00:00 via INTRAVENOUS

## 2019-02-10 MED ORDER — HEPARIN SOD (PORK) LOCK FLUSH 100 UNIT/ML IV SOLN
500.0000 [IU] | Freq: Once | INTRAVENOUS | Status: AC | PRN
  Administered 2019-02-10: 500 [IU]

## 2019-02-10 MED ORDER — POTASSIUM CHLORIDE 2 MEQ/ML IV SOLN
Freq: Once | INTRAVENOUS | Status: AC
  Administered 2019-02-10: 11:00:00 via INTRAVENOUS
  Filled 2019-02-10: qty 10

## 2019-02-10 MED ORDER — PALONOSETRON HCL INJECTION 0.25 MG/5ML
0.2500 mg | Freq: Once | INTRAVENOUS | Status: AC
  Administered 2019-02-10: 11:00:00 0.25 mg via INTRAVENOUS
  Filled 2019-02-10: qty 5

## 2019-02-10 MED ORDER — SODIUM CHLORIDE 0.9 % IV SOLN
750.0000 mg/m2 | Freq: Once | INTRAVENOUS | Status: AC
  Administered 2019-02-10: 1140 mg via INTRAVENOUS
  Filled 2019-02-10: qty 5.26

## 2019-02-10 MED ORDER — SODIUM CHLORIDE 0.9 % IV SOLN
Freq: Once | INTRAVENOUS | Status: AC
  Administered 2019-02-10: 12:00:00 via INTRAVENOUS
  Filled 2019-02-10: qty 5

## 2019-02-10 MED ORDER — SODIUM CHLORIDE 0.9% FLUSH
10.0000 mL | INTRAVENOUS | Status: DC | PRN
  Administered 2019-02-10: 10 mL
  Filled 2019-02-10: qty 10

## 2019-02-10 NOTE — Progress Notes (Signed)
1025 Labs reviewed with and pt seen by Dr. Delton Coombes and pt approved for chemo tx with Cisplatin and Gemzar  today per MD                                                                            Alecia Lemming tolerated chemo tx well without complaints or incident. Port left accessed,saline locked and flushed per protocol for use tomorrow. VSS upon discharge. Pt discharged self ambulatory in satisfactory condition

## 2019-02-10 NOTE — Progress Notes (Signed)
[place], [place] [address]   CLINIC:  Medical Oncology/Hematology  PCP:  Fayrene Helper, MD [address], [place] [address] [phone]   REASON FOR VISIT:  Follow-up for metastatic pancreatic cancer to the liver   BRIEF ONCOLOGIC HISTORY:  Oncology History  Pancreatic cancer metastasized to liver ([place])  03/10/2014 Initial Diagnosis   Pancreatic cancer metastasized to liver   03/22/2014 - 03/05/2016 Chemotherapy   Abraxane/Gemzar days 1, 8, every 28 days.  Day 15 was cancelled due to leukopenia and thrombocytopenia on day 15 cycle 1.   05/24/2014 Treatment Plan Change   Day 8 of cycle 3 is held with ANC of 1.1   06/05/2014 Imaging   CT C/A/P . Interval decrease in size of the pancreatic tail mass.Improved hepatic metastatic disease. No new lesions. No CT findings for metastatic disease involving the chest.   08/09/2014 Tumor Marker   CA 19-9= 33 (WNL)   10/26/2014 Imaging   MRI- Continued interval decrease in size of the hepatic metastatic lesions and no new lesions are identified. Continued decrease in size of the pancreatic tail lesion.   01/03/2015 Tumor Marker   Results for [name], [name] (MRN [id number]) as of 01/18/2015 12:52  01/03/2015 10:00 CA 19-9: 14    01/17/2015 Imaging   MRI- Response to therapy of hepatic metastasis.  Similar size of a pancreatic tail lesion.   03/20/2015 Imaging   MRI-L spine- Severe disc space narrowing at L2-L3, with endplate reactive changes. Large disc extrusion into the ventral epidural space,central to the RIGHT with a cephalad migrated free fragment. Additional Large disc extrusion into the retroperitone...   03/22/2015 Imaging   CT pelvis- No evidence for metastatic disease within the pelvis.   07/24/2015 Imaging   MRI abd- Continued response to therapy, with no residual detectable liver metastases. No new sites of metastatic disease in the abdomen.    08/15/2015 Code Status     She confirms desire for DNR status.   12/21/2015 Imaging   MRI liver- Severe image degradation due to motion artifact reducing diagnostic sensitivity and specificity. Reduce conspicuity of the pancreatic tail lesion suggesting further improvement. The original liver lesions have resolved.   03/05/2016 Treatment Plan Change   Chemotherapy holiday/Break after 2 years worth of treatment   04/07/2016 Imaging   CT chest- When compared to recent chest CT, new minimally displaced anterior left sixth rib fracture. Slight increase in subcarinal adenopathy   04/28/2016 Imaging   Bone density- BMD as determined from Femur Neck Right is 0.705 g/cm2 with a T-Score of -2.4. This patient is considered osteopenic according to [facility]) criteria. Compared with the prior study on 02/23/2013, the BMD of the lumbar spine/rt. femoral neck show a statistically significant decrease.    05/23/2016 Imaging   MRI abd- 1. Exam is significantly degraded by patient respiratory motion. Consider follow-up exams with CT abdomen with without contrast per pancreatic protocol. 2. Fullness in tail of pancreas is more prominent and potentially increased in size. Recommend close attention on follow-up. (Consider CT as above) 3. No explanation for back pain.   09/11/2016 Imaging   MRI pancreas- Interval progression of pancreatic tail lesion. New differential perfusion right liver with areas of heterogeneity. Underlying metastatic disease in this region not excluded.   09/11/2016 Progression   MRi in conjunction with rising CA 19-9 are indicative of relapse of disease.   01/09/2017 Progression   CT C/A/P: 2.1 x 2.9  cm lesion in the pancreatic tail and abutting the splenic hilum, corresponding to known primary pancreatic neoplasm, mildly increased.  Scattered small hypoenhancing lesions in the liver measuring up to 10 mm, approximately 8-10 in number, suspicious for hepatic metastases.  No  findings specific for metastatic disease in the chest.  Additional ancillary findings as above.    01/27/2017 - 10/20/2017 Chemotherapy   The patient had gemcitabine (GEMZAR) 1,292 mg in sodium chloride 0.9 % 250 mL chemo infusion, 800 mg/m2 = 1,292 mg, Intravenous,  Once, 9 of 12 cycles Administration: 1,292 mg (01/28/2017), 1,292 mg (02/11/2017), 1,292 mg (02/25/2017), 1,292 mg (03/11/2017), 1,292 mg (03/25/2017), 1,292 mg (04/08/2017), 1,292 mg (04/22/2017), 1,292 mg (04/29/2017), 1,292 mg (05/20/2017), 1,292 mg (06/03/2017), 1,292 mg (06/24/2017), 1,292 mg (07/08/2017), 1,292 mg (07/29/2017), 1,292 mg (08/12/2017), 1,292 mg (08/26/2017), 1,292 mg (09/09/2017), 1,292 mg (09/23/2017), 1,292 mg (10/07/2017)  for chemotherapy treatment.    10/21/2017 - 09/22/2018 Chemotherapy   The patient had leucovorin 700 mg in dextrose 5 % 250 mL infusion, 644 mg, Intravenous,  Once, 22 of 22 cycles Administration: 700 mg (10/21/2017), 700 mg (11/04/2017), 700 mg (11/18/2017), 700 mg (12/02/2017), 700 mg (12/16/2017), 700 mg (12/30/2017), 700 mg (01/13/2018), 700 mg (01/27/2018), 700 mg (02/10/2018), 700 mg (02/24/2018), 700 mg (03/10/2018), 700 mg (03/24/2018), 700 mg (04/28/2018), 700 mg (05/17/2018), 700 mg (05/31/2018), 700 mg (06/16/2018), 700 mg (06/30/2018), 700 mg (07/21/2018), 700 mg (08/04/2018), 700 mg (08/25/2018), 700 mg (09/08/2018) ondansetron (ZOFRAN) 8 mg in sodium chloride 0.9 % 50 mL IVPB, , Intravenous,  Once, 21 of 21 cycles Administration:  (11/04/2017),  (01/27/2018),  (02/10/2018),  (02/24/2018),  (03/10/2018),  (03/24/2018),  (04/28/2018),  (05/17/2018),  (05/31/2018),  (06/16/2018),  (06/30/2018),  (07/21/2018),  (08/04/2018),  (08/25/2018),  (09/08/2018) fluorouracil (ADRUCIL) 3,850 mg in sodium chloride 0.9 % 73 mL chemo infusion, 2,400 mg/m2 = 3,850 mg, Intravenous, 1 Day/Dose, 22 of 22 cycles Dose modification: 1,920 mg/m2 (original dose 2,400 mg/m2, Cycle 2, Reason: Provider Judgment, Comment: mucisitis) Administration: 3,850 mg  (10/21/2017), 3,100 mg (11/04/2017), 3,100 mg (11/18/2017), 3,100 mg (12/02/2017), 3,100 mg (12/16/2017), 3,100 mg (12/30/2017), 3,100 mg (01/13/2018), 3,100 mg (01/27/2018), 3,100 mg (02/10/2018), 3,100 mg (02/24/2018), 3,100 mg (03/10/2018), 3,100 mg (03/24/2018), 3,100 mg (04/28/2018), 3,100 mg (05/17/2018), 3,100 mg (05/31/2018), 3,100 mg (06/16/2018), 3,100 mg (06/30/2018), 3,100 mg (07/21/2018), 3,100 mg (08/04/2018), 3,100 mg (08/25/2018), 3,100 mg (09/08/2018) irinotecan LIPOSOME (ONIVYDE) 81.7 mg in sodium chloride 0.9 % 500 mL chemo infusion, 50 mg/m2 = 81.7 mg (100 % of original dose 50 mg/m2), Intravenous, Once, 22 of 22 cycles Dose modification: 50 mg/m2 (original dose 50 mg/m2, Cycle 1, Reason: Provider Judgment), 50 mg/m2 (original dose 50 mg/m2, Cycle 2, Reason: Provider Judgment) Administration: 81.7 mg (10/21/2017), 81.7 mg (11/04/2017), 81.7 mg (11/18/2017), 81.7 mg (12/02/2017), 81.7 mg (12/16/2017), 81.7 mg (12/30/2017), 81.7 mg (01/13/2018), 81.7 mg (01/27/2018), 81.7 mg (02/10/2018), 81.7 mg (02/24/2018), 81.7 mg (03/10/2018), 81.7 mg (03/24/2018), 81.7 mg (04/28/2018), 81.7 mg (05/17/2018), 81.7 mg (05/31/2018), 81.7 mg (06/16/2018), 81.7 mg (06/30/2018), 81.7 mg (07/21/2018), 81.7 mg (08/04/2018), 81.7 mg (08/25/2018), 81.7 mg (09/08/2018)  for chemotherapy treatment.    09/22/2018 - 01/11/2019 Chemotherapy   The patient had palonosetron (ALOXI) injection 0.25 mg, 0.25 mg, Intravenous,  Once, 7 of 8 cycles Administration: 0.25 mg (09/22/2018), 0.25 mg (10/11/2018), 0.25 mg (10/25/2018), 0.25 mg (12/01/2018), 0.25 mg (12/15/2018), 0.25 mg (11/17/2018), 0.25 mg (12/29/2018) pegfilgrastim (NEULASTA) injection 6 mg, 6 mg, Subcutaneous, Once, 5 of 5 cycles Administration: 6 mg (10/13/2018), 6 mg (10/27/2018), 6 mg (12/03/2018), 6  mg (12/17/2018), 6 mg (11/19/2018) pegfilgrastim-cbqv (UDENYCA) injection 6 mg, 6 mg, Subcutaneous, Once, 1 of 2 cycles Administration: 6 mg (12/31/2018) leucovorin 628 mg in dextrose 5 % 250 mL infusion, 400 mg/m2 =  628 mg, Intravenous,  Once, 7 of 8 cycles Administration: 628 mg (09/22/2018), 628 mg (10/11/2018), 628 mg (10/25/2018), 628 mg (12/01/2018), 628 mg (12/15/2018), 628 mg (11/17/2018), 628 mg (12/29/2018) oxaliplatin (ELOXATIN) 100 mg in dextrose 5 % 500 mL chemo infusion, 65 mg/m2 = 100 mg (100 % of original dose 65 mg/m2), Intravenous,  Once, 7 of 8 cycles Dose modification: 65 mg/m2 (original dose 65 mg/m2, Cycle 1, Reason: Provider Judgment) Administration: 100 mg (09/22/2018), 100 mg (10/11/2018), 100 mg (10/25/2018), 100 mg (12/01/2018), 100 mg (12/15/2018), 100 mg (11/17/2018), 100 mg (12/29/2018) fluorouracil (ADRUCIL) chemo injection 500 mg, 320 mg/m2 = 500 mg (100 % of original dose 320 mg/m2), Intravenous,  Once, 7 of 8 cycles Dose modification: 320 mg/m2 (original dose 320 mg/m2, Cycle 1, Reason: Provider Judgment) Administration: 500 mg (09/22/2018), 500 mg (10/11/2018), 500 mg (10/25/2018), 500 mg (12/01/2018), 500 mg (12/15/2018), 500 mg (11/17/2018), 500 mg (12/29/2018) fluorouracil (ADRUCIL) 3,000 mg in sodium chloride 0.9 % 90 mL chemo infusion, 1,920 mg/m2 = 3,000 mg (100 % of original dose 1,920 mg/m2), Intravenous, 1 Day/Dose, 7 of 8 cycles Dose modification: 1,920 mg/m2 (original dose 1,920 mg/m2, Cycle 1, Reason: Provider Judgment) Administration: 3,000 mg (09/22/2018), 3,000 mg (10/11/2018), 3,000 mg (10/25/2018), 3,000 mg (12/01/2018), 3,000 mg (12/15/2018), 3,000 mg (11/17/2018), 3,000 mg (12/29/2018)  for chemotherapy treatment.    12/24/2018 Genetic Testing   Negative genetic testing on the common hereditary cancer panel.  The Common Hereditary Gene Panel offered by [facility] includes sequencing and/or deletion duplication testing of the following 48 genes: APC, ATM, AXIN2, BARD1, BMPR1A, BRCA1, BRCA2, BRIP1, CDH1, CDK4, CDKN2A (p14ARF), CDKN2A (p16INK4a), CHEK2, CTNNA1, DICER1, EPCAM (Deletion/duplication testing only), GREM1 (promoter region deletion/duplication testing only), KIT, MEN1, MLH1, MSH2, MSH3, MSH6,  MUTYH, NBN, NF1, NHTL1, PALB2, PDGFRA, PMS2, POLD1, POLE, PTEN, RAD50, RAD51C, RAD51D, RNF43, SDHB, SDHC, SDHD, SMAD4, SMARCA4. STK11, TP53, TSC1, TSC2, and VHL.  The following genes were evaluated for sequence changes only: SDHA and HOXB13 c.251G>A variant only. The report date is December 24, 2018.  DICER1 VUS identified.  This will not change medical decision making.   02/10/2019 -  Chemotherapy   The patient had palonosetron (ALOXI) injection 0.25 mg, 0.25 mg, Intravenous,  Once, 1 of 4 cycles Administration: 0.25 mg (02/10/2019) pegfilgrastim-jmdb (FULPHILA) injection 6 mg, 6 mg, Subcutaneous,  Once, 1 of 4 cycles CISplatin (PLATINOL) 31 mg in sodium chloride 0.9 % 250 mL chemo infusion, 20 mg/m2 = 31 mg (80 % of original dose 25 mg/m2), Intravenous,  Once, 1 of 4 cycles Dose modification: 20 mg/m2 (80 % of original dose 25 mg/m2, Cycle 1, Reason: Other (see comments), Comment: renal dysfunction) Administration: 31 mg (02/10/2019) gemcitabine (GEMZAR) 1,140 mg in sodium chloride 0.9 % 250 mL chemo infusion, 750 mg/m2 = 1,140 mg (75 % of original dose 1,000 mg/m2), Intravenous,  Once, 1 of 4 cycles Dose modification: 750 mg/m2 (75 % of original dose 1,000 mg/m2, Cycle 1, Reason: Provider Judgment) Administration: 1,140 mg (02/10/2019) fosaprepitant (EMEND) 150 mg, dexamethasone (DECADRON) 12 mg in sodium chloride 0.9 % 145 mL IVPB, , Intravenous,  Once, 1 of 4 cycles Administration:  (02/10/2019)  for chemotherapy treatment.       CANCER STAGING: Cancer Staging Pancreatic cancer metastasized to liver [facility]) Staging form: Pancreas, AJCC 7th Edition -  Clinical: Stage IV (T3, N1, M1) - Signed by Baird Cancer, PA-C on 03/16/2014 - Pathologic: No stage assigned - Unsigned    INTERVAL HISTORY:  Ms. Maugeri 85 y.o. female seen for follow-up of metastatic pancreatic cancer to the liver.  She has progressed most recently on FOLFOX chemotherapy.  She has mild numbness in the feet which is stable.   Denies any nausea, vomiting or diarrhea.  Mild constipation is also stable.  She is continuing to have some itching and rash when she drinks Ensure breeze.  Denies any fevers or night sweats.  REVIEW OF SYSTEMS:  Review of Systems  Gastrointestinal: Positive for constipation.  Neurological: Positive for numbness.  All other systems reviewed and are negative.    PAST MEDICAL/SURGICAL HISTORY:  Past Medical History:  Diagnosis Date  . Anemia due to antineoplastic chemotherapy 09/12/2015   Started Aranesp 500 mcg on 09/12/2015  . Anxiety   . Chronic diarrhea   . Depression   . DNR (do not resuscitate) 08/16/2015  . Erosive esophagitis   . Family history of breast cancer in female   . Family history of prostate cancer   . GERD (gastroesophageal reflux disease)   . Hx of adenomatous colonic polyps    tubular adenomas, last found in 2008  . Hyperplastic colon polyp 03/19/10   tcs by Dr. Gala Romney  . Hypertension 20 years   . Kidney stone    hx/ crushed   . Opioid contract exists 04/18/2015   With Dr. Moshe Cipro  . Pancreatic cancer ([place]) 02/2014  . Pancreatic cancer metastasized to liver ([place]) 03/10/2014  . Schatzki's ring 08/26/10   Last dilated on EGD by Dr. Trevor Iha [facility], linear gastric erosions, BI hemigastrectomy   Past Surgical History:  Procedure Laterality Date  . BREAST LUMPECTOMY Left   . bunion removal     from both feet   . CHOLECYSTECTOMY  1965   . COLONOSCOPY  03/19/2010   DR Gala Romney,, normal TI, pancolonic diverticula, random colon bx neg., hyperplastic polyps removed  . ESOPHAGOGASTRODUODENOSCOPY  11/22/2003   DR Gala Romney, erosive RE, Billroth I  . ESOPHAGOGASTRODUODENOSCOPY  08/26/10   Dr. Gala Romney- moderate severe ERE, Scahtzki ring s/p dilation, Billroth I, linear gastric erosions, bx-gastric xanthelasma  . LEFT SHOULDER SURGERY  2009   DR HARRISON  . ORIF ANKLE FRACTURE Right 05/22/2013   Procedure: OPEN REDUCTION INTERNAL FIXATION (ORIF) RIGHT ANKLE FRACTURE;  Surgeon: Sanjuana Kava, MD;  Location: [facility];  Service: Orthopedics;  Laterality: Right;  . stomach ulcer  50 years ago    had some of her stomach removed      SOCIAL HISTORY:  Social History   Socioeconomic History  . Marital status: Widowed    Spouse name: Not on file  . Number of children: 2  . Years of education: 46  . Highest education level: 12th grade  Occupational History  . Occupation: retired from Estate manager/land agent: RETIRED  Social Needs  . Financial resource strain: Not hard at all  . Food insecurity    Worry: Never true    Inability: Never true  . Transportation needs    Medical: No    Non-medical: No  Tobacco Use  . Smoking status: Current Some Day Smoker    Packs/day: 0.50    Years: 60.00    Pack years: 30.00    Types: Cigarettes    Last attempt to quit: 09/09/2016    Years since quitting: 2.4  . Smokeless tobacco: Never Used  Substance and Sexual Activity  . Alcohol use: No  . Drug use: No  . Sexual activity: Not Currently  Lifestyle  . Physical activity    Days per week: 3 days    Minutes per session: 30 min  . Stress: Not at all  Relationships  . Social connections    Talks on phone: More than three times a week    Gets together: Three times a week    Attends religious service: 1 to 4 times per year    Active member of club or organization: No    Attends meetings of clubs or organizations: Never    Relationship status: Widowed  . Intimate partner violence    Fear of current or ex partner: No    Emotionally abused: No    Physically abused: No    Forced sexual activity: No  Other Topics Concern  . Not on file  Social History Narrative   Lives with son alone     FAMILY HISTORY:  Family History  Problem Relation Age of Onset  . Hypertension Mother   . Kidney failure Brother        on dialysis  . Diabetes Sister   . Diabetes Sister   . Hypertension Father   . Kidney failure Sister   . Kidney Stones Brother   . Breast cancer Son 8  . Gout Son 56   . Gout Nephew 57  . Liver disease Neg Hx   . Colon cancer Neg Hx     CURRENT MEDICATIONS:  Outpatient Encounter Medications as of 02/10/2019  Medication Sig  . acetaminophen (TYLENOL) 500 MG tablet Take 500 mg by mouth every 6 (six) hours as needed for mild pain or moderate pain.   Marland Kitchen amLODipine (NORVASC) 10 MG tablet TAKE 1 TABLET DAILY  . clobetasol (TEMOVATE) 0.05 % external solution APP AA ON SCALP BID PRN NOT TO FACE GROIN OR UNDERARMS  . clonazePAM (KLONOPIN) 0.5 MG tablet Take 1 tablet (0.5 mg total) by mouth at bedtime.  . cycloSPORINE (RESTASIS) 0.05 % ophthalmic emulsion Place 1 drop into both eyes 2 (two) times daily.  . diphenhydrAMINE (BENADRYL) 50 MG tablet Take 1 tablet 1 hour prior to CT  . fluorometholone (FML) 0.1 % ophthalmic suspension INSTILL 1 DROP INTO EACH EYE THREE TIMES DAILY FOR 14 DAYS  . fluticasone (FLONASE) 50 MCG/ACT nasal spray Place 1 spray into both nostrils daily.  Marland Kitchen lidocaine (XYLOCAINE) 2 % solution RINSE WITH 5ML AS NEEDED TO RELIEVE MOUTH PAIN.  Marland Kitchen lidocaine-prilocaine (EMLA) cream Apply 1 application topically as needed. Apply to portacath site as needed  . metoprolol tartrate (LOPRESSOR) 25 MG tablet Take one tablet once daily for blood pressure  . MYRBETRIQ 50 MG TB24 tablet TAKE 1 TABLET DAILY  . pantoprazole (PROTONIX) 20 MG tablet Take 1 tablet by mouth daily  . [DISCONTINUED] prochlorperazine (COMPAZINE) 10 MG tablet Take 1 tablet (10 mg total) by mouth every 6 (six) hours as needed (Nausea or vomiting).   No facility-administered encounter medications on file as of 02/10/2019.     ALLERGIES:  Allergies  Allergen Reactions  . Iohexol Hives and Shortness Of Breath    **Patient can not have IV contrast, pt has breakthrough reaction even after 13 hour premeds** Do not give IV contrast PATIENT HAD TO BE TAKEN TO ED, C/O WHELPS, HIVES, DIFFICULTY BREATHING. PATIENT GIVEN IV CONTRAST AFTER 13 HOUR PRE MEDS ON 01/09/2017 WITH NO REACTION NOTED. On  10/19/17 patient had reaction despite premedication.    Marland Kitchen  Aciphex [Rabeprazole Sodium] Other (See Comments)    unknown  . Amlodipine Besylate-Valsartan     Rash to high-dose (5/320)  . Esomeprazole Magnesium   . Omeprazole Other (See Comments)    Patient states "medication didn't work"  . Ciprofloxacin Rash  . Penicillins Swelling and Rash    Has patient had a PCN reaction causing immediate rash, facial/tongue/throat swelling, SOB or lightheadedness with hypotension: Yes Has patient had a PCN reaction causing severe rash involving mucus membranes or skin necrosis: Yes Has patient had a PCN reaction that required hospitalization: No Has patient had a PCN reaction occurring within the last 10 years: yes If all of the above answers are "NO", then may proceed with Cephalosporin use.   [name] [Benzonatate] Itching    Full head, face, neck, and chest itching.      PHYSICAL EXAM:  ECOG Performance status: 1  Vitals:   02/10/19 0844  BP: (!) 156/54  Pulse: (!) 104  Resp: 18  Temp: (!) 97 F (36.1 C)  SpO2: 100%   Filed Weights   02/10/19 0844  Weight: 115 lb 9.6 oz (52.4 kg)    Physical Exam Vitals signs reviewed.  Constitutional:      Appearance: Normal appearance.  Cardiovascular:     Rate and Rhythm: Normal rate and regular rhythm.     Heart sounds: Normal heart sounds.  Pulmonary:     Effort: Pulmonary effort is normal.     Breath sounds: Normal breath sounds.  Abdominal:     General: There is no distension.     Palpations: Abdomen is soft. There is no mass.  Musculoskeletal:        General: No swelling.  Skin:    General: Skin is warm.  Neurological:     General: No focal deficit present.     Mental Status: She is alert and oriented to person, place, and time.  Psychiatric:        Mood and Affect: Mood normal.        Behavior: Behavior normal.      LABORATORY DATA:  I have reviewed the labs as listed.  CBC    Component Value Date/Time    WBC 5.4 02/10/2019 0836   RBC 3.51 (L) 02/10/2019 0836   HGB 10.7 (L) 02/10/2019 0836   HCT 32.7 (L) 02/10/2019 0836   PLT 114 (L) 02/10/2019 0836   MCV 93.2 02/10/2019 0836   MCH 30.5 02/10/2019 0836   MCHC 32.7 02/10/2019 0836   RDW 16.4 (H) 02/10/2019 0836   LYMPHSABS 1.0 02/10/2019 0836   MONOABS 0.4 02/10/2019 0836   EOSABS 0.2 02/10/2019 0836   BASOSABS 0.0 02/10/2019 0836   CMP Latest Ref Rng & Units 02/10/2019 01/12/2019 12/29/2018  Glucose 70 - 99 mg/dL 112(H) 109(H) 102(H)  BUN 8 - 23 mg/dL 26(H) 22 19  Creatinine 0.44 - 1.00 mg/dL 1.21(H) 1.20(H) 1.28(H)  Sodium 135 - 145 mmol/L 141 141 139  Potassium 3.5 - 5.1 mmol/L 3.9 3.3(L) 3.8  Chloride 98 - 111 mmol/L 105 113(H) 106  CO2 22 - 32 mmol/L '25 22 24  '$ Calcium 8.9 - 10.3 mg/dL 8.6(L) 8.0(L) 8.5(L)  Total Protein 6.5 - 8.1 g/dL 7.7 7.2 7.2  Total Bilirubin 0.3 - 1.2 mg/dL 0.8 0.6 0.6  Alkaline Phos 38 - 126 U/L 136(H) 187(H) 208(H)  AST 15 - 41 U/L '18 17 15  '$ ALT 0 - 44 U/L '13 12 10       '$ DIAGNOSTIC IMAGING:  I  have independently reviewed the scans and discussed with the patient.   I have reviewed Venita Lick LPN's note and agree with the documentation.  I personally performed a face-to-face visit, made revisions and my assessment and plan is as follows.    ASSESSMENT & PLAN:   Pancreatic cancer metastasized to liver ([place]) 1.  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 - Gemcitabine and Abraxane from normal 2015 through August 2018 with progression. -Gemcitabine and Tarceva from 01/28/2017 through 10/07/2017 with progression. -21 cycles of infusional 5-FU and Onivyde from 10/21/2017-09/08/2018. -7 cycles of FOLFOX from 09/22/2018-12/29/2018. -CT CAP on 01/10/2019 showed increase in size of dominant hepatic lesion near the dome measuring 4.7 cm, previously 2.6 cm.  Other lesions in the liver are stable.  No new lesions  seen.  No lesions in the lungs. - I discussed about available clinical trial at [facility] for K-ras G 12 V mutations.  She does not want to travel that far. 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  MSI high was not detected. - We talked about gemcitabine with cisplatin regimen given on day 1 and 8 every 21 days.  We talked about side effects in detail. -I have reviewed her labs.  She will proceed with day 1 of therapy today.  We will reevaluate her in 1 week. - She has mild numbness in the feet from her Abraxane chemotherapy.  We will closely monitor it.  2.  Weight loss: -She has itching and rash when she was drinking Ensure breeze. -She could not tolerate regular Ensure secondary to stomach pain.  She lost 1 pound.  3.  Genetic testing: -Germline mutation testing was negative on 12/24/2018.  Total time spent is 25 minutes with more than 50% of the time spent face-to-face discussing new treatment plan, counseling and coordination of care.    Orders placed this encounter:  No orders of the defined types were placed in this encounter.     Derek Jack, MD [place] [phone]

## 2019-02-10 NOTE — Assessment & Plan Note (Signed)
1.  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 - Gemcitabine and Abraxane from normal 2015 through August 2018 with progression. -Gemcitabine and Tarceva from 01/28/2017 through 10/07/2017 with progression. -21 cycles of infusional 5-FU and Onivyde from 10/21/2017-09/08/2018. -7 cycles of FOLFOX from 09/22/2018-12/29/2018. -CT CAP on 01/10/2019 showed increase in size of dominant hepatic lesion near the dome measuring 4.7 cm, previously 2.6 cm.  Other lesions in the liver are stable.  No new lesions seen.  No lesions in the lungs. - I discussed about available clinical trial at [facility] for K-ras G 12 V mutations.  She does not want to travel that far. 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  MSI high was not detected. - We talked about gemcitabine with cisplatin regimen given on day 1 and 8 every 21 days.  We talked about side effects in detail. -I have reviewed her labs.  She will proceed with day 1 of therapy today.  We will reevaluate her in 1 week. - She has mild numbness in the feet from her Abraxane chemotherapy.  We will closely monitor it.  2.  Weight loss: -She has itching and rash when she was drinking Ensure breeze. -She could not tolerate regular Ensure secondary to stomach pain.  She lost 1 pound.  3.  Genetic testing: -Germline mutation testing was negative on 12/24/2018.

## 2019-02-10 NOTE — Patient Instructions (Addendum)
Dr. Delton Coombes has changed your chemotherapy regimen.  You will be treated with gemcitabine and cisplatin on Days 1 and 8 every 21 days (two weeks of treatment with one week off prior to starting your next cycle).       Medications you will receive in the clinic prior to your chemotherapy medications:  Aloxi:  ALOXI is used in adults to help prevent nausea and vomiting that happens with certain chemotherapy drugs.  Aloxi is a long acting medication, and will remain in your system for about two days.   Emend:  This is an anti-nausea medication that is used with Aloxi to help prevent nausea and vomiting caused by chemotherapy.  Dexamethasone:  This is a steroid given prior to chemotherapy to help prevent allergic reactions; it may also help prevent and control nausea and diarrhea.   CISPLATIN  About This Drug Cisplatin is a drug used to treat cancer. This drug is given in the vein (IV).  This will take 1 hour to infuse.  With this drug you will receive 2 hours of IV hydration prior to administration and 1 hour of IV hydration after administration.  This is to help protect your kidneys.  You will have to urinate 200 mL prior to receiving this medication.  We will give you something to measure your urine in.   Possible Side Effects (More Common) . This drug may affect how your kidneys work. Your kidney function will be checked as needed. . Electrolyte changes. Your blood will be checked for electrolyte changes as needed. . High-frequency hearing loss may occur. You will get IV fluids before and during the Cisplatin infusion to help prevent this. You may also get ringing in the ears. . Bone marrow depression. This is a decrease in the number of white blood cells, red blood cells, and platelets. This may raise your risk of infection, make you tired and weak (fatigue), and raise your risk of bleeding. . Nausea and throwing up (vomiting). These symptoms may happen within a few hours after your  treatment and may last for a few days to a week. Medicines are available to stop or lessen these side effects.  Possible Side Effects (Less Common) . Effects on the nerves are called peripheral neuropathy. You may feel numbness or pain in your hands and feet. It may be hard for you to button your clothes, open jars, or walk as usual. The effect on the nerves may get worse with more doses of the drug. These effects get better in some people after the drug is stopped, but it does not get better in all people. Marland Kitchen Blurred vision or other changes in eyesight. . Soreness of the mouth and throat. You may have red areas, white patches, or sores that hurt. . Hair loss. You may notice your hair getting thin. Some patients lose their hair. Your hair often grows back when treatment is done.  Allergic Reactions Allergic reactions to this drug are rare, but may happen in some patients. Signs of allergic reactions to this drug may be a rash, fever, chills, feeling dizzy, trouble breathing, and/or feeling that your heart is beating in a fast or not normal way.  Treating Side Effects . Drink 6-8 cups of fluids each day unless your doctor has told you to limit your fluid intake due to some other health problem. A cup is 8 ounces of fluid. If you throw up or have loose bowel movements you should drink more fluids so that you do not  become dehydrated (lack water in the body due to losing too much fluid). . If you have numbness and tingling in your hands and feet, be careful when cooking, walking, and handling sharp objects and hot liquids. . Mouth care is very important. Your mouth care should consist of routine, gentle cleaning of your teeth or dentures and rinsing your mouth with a mixture of 1/2 teaspoon of salt in 8 ounces of water or  teaspoon of baking soda in 8 ounces of water. This should be done at least after each meal and at bedtime. . If you have mouth sores, avoid mouthwash that has alcohol. Also avoid  alcohol and smoking because they can bother your mouth and throat. . Talk with your nurse about getting a wig before you lose your hair. Also, call the [facility] at 800-ACS-2345 to find out information about the "Look Good, Feel Better" program close to where you live. It is a free program where women getting chemotherapy can learn about wigs, turbans and scarves as well as makeup techniques and skin and nail care.  Food and Drug Interactions  There are no known interactions of Cisplatin with food. This drug may interact with other medicines. Tell your doctor and pharmacist about all the medicines and dietary supplements (vitamins, minerals, herbs and others) that you are taking at this time. The safety and use of dietary supplements and alternative diets are often not known. Using these might affect your cancer or interfere with your treatment. Until more is known, you should not use dietary supplements or alternative diets without your cancer doctor's help.  When to Call the Doctor  Call your doctor or nurse right away if you have any of these symptoms: . Rash or itching . Feeling dizzy or lightheaded . Wheezing or trouble breathing . Swelling of the face . Fever of 100.5 F (38 C) or above . Chills . Easy bleeding or bruising . Decreased urine . Weight gain of 5 pounds in one week (fluid retention) . Nausea that stops you from eating or drinking . Throwing up more than 3 times a day Call your doctor or nurse as soon as possible if you have these symptoms: . Numbness, tingling, decreased feeling or weakness in fingers, toes, arms, or legs . Trouble walking or changes in the way you walk, feeling clumsy when buttoning clothes, opening jars, or other routine hand motions . Blurred vision or other changes in eyesight . Changes in hearing, ringing in the ears . Pain in your mouth or throat that makes it hard to eat or drink . Fatigue that interferes with your daily  activities  Sexual Problems and Reproductive Concerns  . Infertility warning: Sexual problems and reproduction concerns may occur. In both men and women, this drug may affect your ability to have children. This cannot be determined before your treatment. Speak with your doctor or nurse if you plan to have children. Ask for information on sperm or egg banking. . In men, this drug may interfere with your ability to make sperm, but it should not change your ability to have sexual relations. . In women, menstrual bleeding may become irregular or stop while you are receiving this drug. Do not assume that you cannot become pregnant if you do not have a menstrual period. . Women may experience signs of menopause like vaginal dryness, itching, and pain during sexual relations . Genetic counseling is available for you to talk about the effects of this drug therapy on future  pregnancies. Also, a genetic counselor can look at the possible risk of problems in the unborn baby due to this medicine if an exposure happens during pregnancy.  Gemcitabine (Gemzar)  About This Drug Gemcitabine is used to treat cancer. It is given in the vein (IV).  Possible Side Effects . Bone marrow suppression. This is a decrease in the number of white blood cells, red blood cells, and platelets. This may raise your risk of infection, make you tired and weak (fatigue), and raise your risk of bleeding . Fever . Trouble breathing . Nausea and throwing up (vomiting) . Changes in your liver function . Increased protein in your urine, which can affect how your kidneys work . Blood in your urine . Rash . Swelling of your legs, ankles and/or feet  Note: Each of the side effects above was reported in 20% or greater of patients treated with Gemcitabine. Not all possible side effects are included above.  Warnings and Precautions . Severe bone marrow suppression . Inflammation (swelling) of the lungs and/or thickening of the  lung tissues, which may be lifethreatening. You may have a dry cough or trouble breathing. . Changes in your kidney function, which can cause kidney failure . Changes in your liver function, which can cause liver failure and may be life-threatening . If you have received radiation treatments, your skin may become red and/or you may develop soreness of the mouth and throat after gemcitabine. This reaction is called "recall." Your body is recalling, or remembering, that it had radiation therapy. . A syndrome where fluid from your veins can leak into your tissues and cause a decrease in your blood pressure and fluid to accumulate in your tissues and/or lungs. . A syndrome can occur that causes changes to kidney and liver function in combination with a decrease in red blood cells. Kidney failure may result which may be life-threatening. . Changes in your central nervous system can happen. The central nervous system is made up of your brain and spinal cord. You could feel extreme tiredness, agitation, confusion, hallucinations (see or hear things that are not there), have trouble understanding or speaking, loss of control of your bowels or bladder, eyesight changes, numbness or lack of strength to your arms, legs, face, or body, seizures or coma. If you start to have any of these symptoms let your doctor know right away.  Note: Some of the side effects above are very rare. If you have concerns and/or questions, please discuss them with your medical team.  Important Information . This drug may be present in the saliva, tears, sweat, urine, stool, vomit, semen, and vaginal secretions. Talk to your doctor and/or your nurse about the necessary precautions to take during this time.  Treating Side Effects . Manage tiredness by pacing your activities for the day. . Be sure to include periods of rest between energy-draining activities. . To decrease the risk of infection, wash your hands regularly. .  Avoid close contact with people who have a cold, the flu, or other infections. . Take your temperature as your doctor or nurse tells you, and whenever you feel like you may have a fever. . To help decrease bleeding, use a soft toothbrush. Check with your nurse before using dental floss. . Be very careful when using knives or tools. . Use an electric shaver instead of a razor. . Drink plenty of fluids (a minimum of eight glasses per day is recommended). . If you throw up or have loose bowel movements, you  should drink more fluids so that you do not become dehydrated (lack of water in the body from losing too much fluid). . To help with nausea and vomiting, eat small, frequent meals instead of three large meals a day. Choose foods and drinks that are at room temperature. Ask your nurse or doctor about other helpful tips and medicine that is available to help stop or lessen these symptoms. . If you get a rash do not put anything on it unless your doctor or nurse says you may. Keep the area around the rash clean and dry. Ask your doctor for medicine if your rash bothers you. . If you received radiation, and your skin becomes red or irritated again, or you develop soreness of the mouth and throat, follow the same care instructions you did during radiation treatment. Be sure to tell the nurse or doctor administering your chemotherapy about your skin changes.  Food and Drug Interactions . There are no known interactions of gemcitabine with food. . This drug may interact with other medicines. Tell your doctor and pharmacist about all the prescription and over-the-counter medicines and dietary supplements (vitamins, minerals, herbs and others) that you are taking at this time. Also, check with your doctor or pharmacist before starting any new prescription or over-the-counter medicines, or dietary supplements to make sure that there are no interactions.  When to Call the Doctor Call your doctor or  nurse if you have any of these symptoms and/or any new or unusual symptoms: . Fever of 100.4 F (38 C) or higher . Chills . Tiredness that interferes with your daily activities . Feeling dizzy or lightheaded . Pain in your chest . Dry cough . Wheezing and/or trouble breathing . Confusion and/or agitation . Symptoms of a seizure such as confusion, blacking out, passing out, loss of hearing or vision, blurred vision, unusual smells or tastes (such as burning rubber), trouble talking, tremors or shaking in parts or all of the body, repeated body movements, tense muscles that do not relax, and loss of control of urine and bowels. If you or your family member suspects you are having a seizure, call 911 right away. . Hallucinations . Trouble understanding or speaking . Blurry vision or changes in your eyesight . Numbness or lack of strength to your arms, legs, face, or body . Easy bleeding or bruising . Nausea that stops you from eating or drinking and/or is not relieved by prescribed medicines . Throwing up more than 3 times a day . Swelling of legs, ankles, or feet . Weight gain of 5 pounds in one week (fluid retention) . Blood in urine . Decreased urine or very dark urine . Foamy or bubbly-looking urine . A new rash/itching or a rash that is not relieved by prescribed medicines . Signs of possible liver problems: dark urine, pale bowel movements, bad stomach pain, feeling very tired and weak, unusual itching, or yellowing of the eyes or skin . If you think you may be pregnant or may have impregnated your partner  Reproduction Warnings . Pregnancy warning: This drug can have harmful effects on the unborn baby. Women of childbearing potential should use effective methods of birth control during your cancer treatment and for 6 months after treatment. Men with female partners of childbearing potential should use effective methods of birth control during your cancer treatment and for 3  months after your cancer treatment. Let your doctor know right away if you think you may be pregnant or may have impregnated your  partner.  . Breastfeeding warning: Women should not breastfeed during treatment and for 1 week after treatment because this drug could enter the breast milk and cause harm to a breastfeeding baby.  . Fertility warning: In men, this drug may affect your ability to have children in the future. Talk with your doctor or nurse if you plan to have children. Ask for information on sperm banki

## 2019-02-10 NOTE — Progress Notes (Signed)
DISCONTINUE ON PATHWAY REGIMEN - Pancreatic Adenocarcinoma     A cycle is every 14 days:     Oxaliplatin      Leucovorin      5-Fluorouracil      5-Fluorouracil   **Always confirm dose/schedule in your pharmacy ordering system**  REASON: Disease Progression PRIOR TREATMENT: PANOS39: FOLFOX q14 Days Until Progression or Toxicity TREATMENT RESPONSE: Progressive Disease (PD)  START OFF PATHWAY REGIMEN - Pancreatic Adenocarcinoma   [id number]:Cisplatin 25 mg/m2 D1,8 + Gemcitabine 1,000 mg/m2 D1,8 q21 Days:   A cycle is every 21 days:     Gemcitabine      Cisplatin   **Always confirm dose/schedule in your pharmacy ordering system**  Patient Characteristics: Metastatic Disease, Third Line and Beyond, MSS/pMMR or MSI Unknown Current evidence of distant metastases<= Yes AJCC T Category: TX AJCC N Category: NX AJCC M Category: M1 AJCC 8 Stage Grouping: IV Line of Therapy: Third Engineer, civil (consulting) Status: Unknown Intent of Therapy: Non-Curative / Palliative Intent, Discussed with Patient

## 2019-02-10 NOTE — Patient Instructions (Signed)
[facility] Discharge Instructions for Patients Receiving Chemotherapy   Beginning January 23rd 2017 lab work for the [facility] will be done in the  Main lab at [facility] on 1st floor. If you have a lab appointment with the [facility] please come in thru the  Main Entrance and check in at the main information desk   Today you received the following chemotherapy agents Cisplatin and Gemzar. Follow-up as scheduled. Call clinic for any questions or concerns  To help prevent nausea and vomiting after your treatment, we encourage you to take your nausea medication   If you develop nausea and vomiting, or diarrhea that is not controlled by your medication, call the clinic.  The clinic phone number is [phone]. Office hours are Monday-Friday 8:30am-5:00pm.  BELOW ARE SYMPTOMS THAT SHOULD BE REPORTED IMMEDIATELY:  *FEVER GREATER THAN 101.0 F  *CHILLS WITH OR WITHOUT FEVER  NAUSEA AND VOMITING THAT IS NOT CONTROLLED WITH YOUR NAUSEA MEDICATION  *UNUSUAL SHORTNESS OF BREATH  *UNUSUAL BRUISING OR BLEEDING  TENDERNESS IN MOUTH AND THROAT WITH OR WITHOUT PRESENCE OF ULCERS  *URINARY PROBLEMS  *BOWEL PROBLEMS  UNUSUAL RASH Items with * indicate a potential emergency and should be followed up as soon as possible. If you have an emergency after office hours please contact your primary care physician or go to the nearest emergency department.  Please call the clinic during office hours if you have any questions or concerns.   You may also contact the Patient Navigator at [phone] should you have any questions or need assistance in obtaining follow up care.      Resources For Cancer Patients and their Caregivers ? American Cancer Society: Can assist with transportation, wigs, general needs, runs Look Good Feel Better.        873-008-7548 ? Cancer Care: Provides financial assistance, online support groups, medication/co-pay assistance.   1-800-813-HOPE 616 432 2930) ? [facility] Assists [place] cancer patients and their families through emotional , educational and financial support.  (604) 743-0864 ? [place] DSS Where to apply for food stamps, Medicaid and utility assistance. (331)458-2026 ? [facility]: Transportation to medical appointments. 905-632-4977 ? Social Security Administration: May apply for disability if have a Stage IV cancer. [phone] ? [employer], Disability and Transit Services: Assists with nutrition, care and transit needs. 3107893742

## 2019-02-10 NOTE — Patient Instructions (Addendum)
[place] at [facility] Discharge Instructions  You were seen today by Dr. Delton Coombes. He went over your recent lab results. He will have you come back tomorrow for fluids. He will see you back in 1 week for labs, treatment and follow up.   Thank you for choosing [place] at [facility] to provide your oncology and hematology care.  To afford each patient quality time with our provider, please arrive at least 15 minutes before your scheduled appointment time.   If you have a lab appointment with the [facility] please come in thru the  Main Entrance and check in at the main information desk  You need to re-schedule your appointment should you arrive 10 or more minutes late.  We strive to give you quality time with our providers, and arriving late affects you and other patients whose appointments are after yours.  Also, if you no show three or more times for appointments you may be dismissed from the clinic at the providers discretion.     Again, thank you for choosing [facility].  Our hope is that these requests will decrease the amount of time that you wait before being seen by our physicians.       _____________________________________________________________  Should you have questions after your visit to [facility], please contact our office at [phone] between the hours of 8:00 a.m. and 4:30 p.m.  Voicemails left after 4:00 p.m. will not be returned until the following business day.  For prescription refill requests, have your pharmacy contact our office and allow 72 hours.    Cancer Center Support Programs:   > Cancer Support Group  2nd Tuesday of the month 1pm-2pm, Journey Room

## 2019-02-11 ENCOUNTER — Other Ambulatory Visit: Payer: Self-pay | Admitting: Family Medicine

## 2019-02-11 ENCOUNTER — Inpatient Hospital Stay (HOSPITAL_COMMUNITY): Payer: Medicare HMO

## 2019-02-11 ENCOUNTER — Other Ambulatory Visit: Payer: Self-pay

## 2019-02-11 ENCOUNTER — Telehealth: Payer: Self-pay | Admitting: Internal Medicine

## 2019-02-11 VITALS — BP 175/60 | HR 66 | Temp 97.6°F | Resp 16

## 2019-02-11 DIAGNOSIS — C787 Secondary malignant neoplasm of liver and intrahepatic bile duct: Secondary | ICD-10-CM | POA: Diagnosis not present

## 2019-02-11 DIAGNOSIS — Z5189 Encounter for other specified aftercare: Secondary | ICD-10-CM | POA: Diagnosis not present

## 2019-02-11 DIAGNOSIS — C252 Malignant neoplasm of tail of pancreas: Secondary | ICD-10-CM | POA: Diagnosis not present

## 2019-02-11 DIAGNOSIS — D6481 Anemia due to antineoplastic chemotherapy: Secondary | ICD-10-CM

## 2019-02-11 DIAGNOSIS — T451X5A Adverse effect of antineoplastic and immunosuppressive drugs, initial encounter: Secondary | ICD-10-CM

## 2019-02-11 DIAGNOSIS — Z5111 Encounter for antineoplastic chemotherapy: Secondary | ICD-10-CM | POA: Diagnosis not present

## 2019-02-11 LAB — CANCER ANTIGEN 19-9: CA 19-9: 376 U/mL — ABNORMAL HIGH (ref 0–35)

## 2019-02-11 MED ORDER — HEPARIN SOD (PORK) LOCK FLUSH 100 UNIT/ML IV SOLN
500.0000 [IU] | Freq: Once | INTRAVENOUS | Status: AC | PRN
  Administered 2019-02-11: 500 [IU]

## 2019-02-11 MED ORDER — SODIUM CHLORIDE 0.9% FLUSH
10.0000 mL | Freq: Once | INTRAVENOUS | Status: AC | PRN
  Administered 2019-02-11: 10:00:00 10 mL

## 2019-02-11 MED ORDER — SODIUM CHLORIDE 0.9 % IV SOLN
Freq: Once | INTRAVENOUS | Status: AC
  Administered 2019-02-11: 10:00:00 via INTRAVENOUS
  Filled 2019-02-11: qty 1000

## 2019-02-11 NOTE — Progress Notes (Signed)
Upon arrival to the clinic today. Port needle was  already dislodged, dressing slight intact, removed dressing, engaged safety needle and discarder. Port re-accessed with no issues and proceeded with hydration fluids today.    Hydration fluids given today per MD.  Patient tolerated it well without problems. Vitals stable and discharged home from clinic ambulatory. Follow up as scheduled.

## 2019-02-11 NOTE — Telephone Encounter (Signed)
Called patient's home & cell number to schedule Palliative Consult, no answer.  I was unable to leave a message on the home number due to no voicemail set up, but was able to leave message on cell - message left with reason for call along with my contact information

## 2019-02-16 ENCOUNTER — Ambulatory Visit (HOSPITAL_COMMUNITY): Payer: Medicare HMO

## 2019-02-16 ENCOUNTER — Inpatient Hospital Stay (HOSPITAL_BASED_OUTPATIENT_CLINIC_OR_DEPARTMENT_OTHER): Payer: Medicare HMO | Admitting: Hematology

## 2019-02-16 ENCOUNTER — Other Ambulatory Visit: Payer: Self-pay

## 2019-02-16 ENCOUNTER — Encounter (HOSPITAL_COMMUNITY): Payer: Self-pay | Admitting: Hematology

## 2019-02-16 ENCOUNTER — Other Ambulatory Visit (HOSPITAL_COMMUNITY): Payer: Medicare HMO

## 2019-02-16 ENCOUNTER — Ambulatory Visit (HOSPITAL_COMMUNITY): Payer: Medicare HMO | Admitting: Hematology

## 2019-02-16 ENCOUNTER — Inpatient Hospital Stay (HOSPITAL_COMMUNITY): Payer: Medicare HMO

## 2019-02-16 VITALS — BP 169/60 | HR 51 | Temp 96.8°F | Resp 18

## 2019-02-16 DIAGNOSIS — C259 Malignant neoplasm of pancreas, unspecified: Secondary | ICD-10-CM

## 2019-02-16 DIAGNOSIS — Z5111 Encounter for antineoplastic chemotherapy: Secondary | ICD-10-CM | POA: Diagnosis not present

## 2019-02-16 DIAGNOSIS — Z5189 Encounter for other specified aftercare: Secondary | ICD-10-CM | POA: Diagnosis not present

## 2019-02-16 DIAGNOSIS — C252 Malignant neoplasm of tail of pancreas: Secondary | ICD-10-CM | POA: Diagnosis not present

## 2019-02-16 DIAGNOSIS — C787 Secondary malignant neoplasm of liver and intrahepatic bile duct: Secondary | ICD-10-CM

## 2019-02-16 LAB — CBC WITH DIFFERENTIAL/PLATELET
Abs Immature Granulocytes: 0.01 10*3/uL (ref 0.00–0.07)
Basophils Absolute: 0 10*3/uL (ref 0.0–0.1)
Basophils Relative: 0 %
Eosinophils Absolute: 0.1 10*3/uL (ref 0.0–0.5)
Eosinophils Relative: 2 %
HCT: 29.7 % — ABNORMAL LOW (ref 36.0–46.0)
Hemoglobin: 9.7 g/dL — ABNORMAL LOW (ref 12.0–15.0)
Immature Granulocytes: 0 %
Lymphocytes Relative: 25 %
Lymphs Abs: 0.9 10*3/uL (ref 0.7–4.0)
MCH: 30.5 pg (ref 26.0–34.0)
MCHC: 32.7 g/dL (ref 30.0–36.0)
MCV: 93.4 fL (ref 80.0–100.0)
Monocytes Absolute: 0.1 10*3/uL (ref 0.1–1.0)
Monocytes Relative: 1 %
Neutro Abs: 2.6 10*3/uL (ref 1.7–7.7)
Neutrophils Relative %: 72 %
Platelets: 69 10*3/uL — ABNORMAL LOW (ref 150–400)
RBC: 3.18 MIL/uL — ABNORMAL LOW (ref 3.87–5.11)
RDW: 15.4 % (ref 11.5–15.5)
WBC: 3.6 10*3/uL — ABNORMAL LOW (ref 4.0–10.5)
nRBC: 0 % (ref 0.0–0.2)

## 2019-02-16 LAB — COMPREHENSIVE METABOLIC PANEL
ALT: 17 U/L (ref 0–44)
AST: 21 U/L (ref 15–41)
Albumin: 3.5 g/dL (ref 3.5–5.0)
Alkaline Phosphatase: 127 U/L — ABNORMAL HIGH (ref 38–126)
Anion gap: 8 (ref 5–15)
BUN: 31 mg/dL — ABNORMAL HIGH (ref 8–23)
CO2: 26 mmol/L (ref 22–32)
Calcium: 8.3 mg/dL — ABNORMAL LOW (ref 8.9–10.3)
Chloride: 103 mmol/L (ref 98–111)
Creatinine, Ser: 1.06 mg/dL — ABNORMAL HIGH (ref 0.44–1.00)
GFR calc Af Amer: 54 mL/min — ABNORMAL LOW (ref >60)
GFR calc non Af Amer: 47 mL/min — ABNORMAL LOW (ref >60)
Glucose, Bld: 104 mg/dL — ABNORMAL HIGH (ref 70–99)
Potassium: 4.2 mmol/L (ref 3.5–5.1)
Sodium: 137 mmol/L (ref 135–145)
Total Bilirubin: 0.5 mg/dL (ref 0.3–1.2)
Total Protein: 7.1 g/dL (ref 6.5–8.1)

## 2019-02-16 LAB — MAGNESIUM: Magnesium: 2 mg/dL (ref 1.7–2.4)

## 2019-02-16 MED ORDER — HEPARIN SOD (PORK) LOCK FLUSH 100 UNIT/ML IV SOLN
500.0000 [IU] | Freq: Once | INTRAVENOUS | Status: AC | PRN
  Administered 2019-02-16: 12:00:00 500 [IU]

## 2019-02-16 MED ORDER — SODIUM CHLORIDE 0.9 % IV SOLN: Freq: Once | INTRAVENOUS | Status: DC

## 2019-02-16 MED ORDER — SODIUM CHLORIDE 0.9% FLUSH
10.0000 mL | INTRAVENOUS | Status: DC | PRN
  Administered 2019-02-16: 12:00:00 10 mL
  Filled 2019-02-16: qty 10

## 2019-02-16 MED ORDER — POTASSIUM CHLORIDE 2 MEQ/ML IV SOLN
Freq: Once | INTRAVENOUS | Status: AC
  Administered 2019-02-16: 09:00:00 via INTRAVENOUS
  Filled 2019-02-16: qty 10

## 2019-02-16 NOTE — Progress Notes (Signed)
[place], [place] [address]   CLINIC:  Medical Oncology/Hematology  PCP:  Fayrene Helper, MD [address] [place] [address] [phone]   REASON FOR VISIT:  Follow-up for metastatic pancreatic cancer to the liver   BRIEF ONCOLOGIC HISTORY:  Oncology History  Pancreatic cancer metastasized to liver ([place])  03/10/2014 Initial Diagnosis   Pancreatic cancer metastasized to liver   03/22/2014 - 03/05/2016 Chemotherapy   Abraxane/Gemzar days 1, 8, every 28 days.  Day 15 was cancelled due to leukopenia and thrombocytopenia on day 15 cycle 1.   05/24/2014 Treatment Plan Change   Day 8 of cycle 3 is held with ANC of 1.1   06/05/2014 Imaging   CT C/A/P . Interval decrease in size of the pancreatic tail mass.Improved hepatic metastatic disease. No new lesions. No CT findings for metastatic disease involving the chest.   08/09/2014 Tumor Marker   CA 19-9= 33 (WNL)   10/26/2014 Imaging   MRI- Continued interval decrease in size of the hepatic metastatic lesions and no new lesions are identified. Continued decrease in size of the pancreatic tail lesion.   01/03/2015 Tumor Marker   Results for [name] (MRN [id number]) as of 01/18/2015 12:52  01/03/2015 10:00 CA 19-9: 14    01/17/2015 Imaging   MRI- Response to therapy of hepatic metastasis.  Similar size of a pancreatic tail lesion.   03/20/2015 Imaging   MRI-L spine- Severe disc space narrowing at L2-L3, with endplate reactive changes. Large disc extrusion into the ventral epidural space,central to the RIGHT with a cephalad migrated free fragment. Additional Large disc extrusion into the retroperitone...   03/22/2015 Imaging   CT pelvis- No evidence for metastatic disease within the pelvis.   07/24/2015 Imaging   MRI abd- Continued response to therapy, with no residual detectable liver metastases. No new sites of metastatic disease in the abdomen.    08/15/2015 Code Status      She confirms desire for DNR status.   12/21/2015 Imaging   MRI liver- Severe image degradation due to motion artifact reducing diagnostic sensitivity and specificity. Reduce conspicuity of the pancreatic tail lesion suggesting further improvement. The original liver lesions have resolved.   03/05/2016 Treatment Plan Change   Chemotherapy holiday/Break after 2 years worth of treatment   04/07/2016 Imaging   CT chest- When compared to recent chest CT, new minimally displaced anterior left sixth rib fracture. Slight increase in subcarinal adenopathy   04/28/2016 Imaging   Bone density- BMD as determined from Femur Neck Right is 0.705 g/cm2 with a T-Score of -2.4. This patient is considered osteopenic according to [facility]) criteria. Compared with the prior study on 02/23/2013, the BMD of the lumbar spine/rt. femoral neck show a statistically significant decrease.    05/23/2016 Imaging   MRI abd- 1. Exam is significantly degraded by patient respiratory motion. Consider follow-up exams with CT abdomen with without contrast per pancreatic protocol. 2. Fullness in tail of pancreas is more prominent and potentially increased in size. Recommend close attention on follow-up. (Consider CT as above) 3. No explanation for back pain.   09/11/2016 Imaging   MRI pancreas- Interval progression of pancreatic tail lesion. New differential perfusion right liver with areas of heterogeneity. Underlying metastatic disease in this region not excluded.   09/11/2016 Progression   MRi in conjunction with rising CA 19-9 are indicative of relapse of disease.   01/09/2017 Progression   CT C/A/P: 2.1 x  2.9 cm lesion in the pancreatic tail and abutting the splenic hilum, corresponding to known primary pancreatic neoplasm, mildly increased.  Scattered small hypoenhancing lesions in the liver measuring up to 10 mm, approximately 8-10 in number, suspicious for hepatic metastases.  No  findings specific for metastatic disease in the chest.  Additional ancillary findings as above.    01/27/2017 - 10/20/2017 Chemotherapy   The patient had gemcitabine (GEMZAR) 1,292 mg in sodium chloride 0.9 % 250 mL chemo infusion, 800 mg/m2 = 1,292 mg, Intravenous,  Once, 9 of 12 cycles Administration: 1,292 mg (01/28/2017), 1,292 mg (02/11/2017), 1,292 mg (02/25/2017), 1,292 mg (03/11/2017), 1,292 mg (03/25/2017), 1,292 mg (04/08/2017), 1,292 mg (04/22/2017), 1,292 mg (04/29/2017), 1,292 mg (05/20/2017), 1,292 mg (06/03/2017), 1,292 mg (06/24/2017), 1,292 mg (07/08/2017), 1,292 mg (07/29/2017), 1,292 mg (08/12/2017), 1,292 mg (08/26/2017), 1,292 mg (09/09/2017), 1,292 mg (09/23/2017), 1,292 mg (10/07/2017)  for chemotherapy treatment.    10/21/2017 - 09/22/2018 Chemotherapy   The patient had leucovorin 700 mg in dextrose 5 % 250 mL infusion, 644 mg, Intravenous,  Once, 22 of 22 cycles Administration: 700 mg (10/21/2017), 700 mg (11/04/2017), 700 mg (11/18/2017), 700 mg (12/02/2017), 700 mg (12/16/2017), 700 mg (12/30/2017), 700 mg (01/13/2018), 700 mg (01/27/2018), 700 mg (02/10/2018), 700 mg (02/24/2018), 700 mg (03/10/2018), 700 mg (03/24/2018), 700 mg (04/28/2018), 700 mg (05/17/2018), 700 mg (05/31/2018), 700 mg (06/16/2018), 700 mg (06/30/2018), 700 mg (07/21/2018), 700 mg (08/04/2018), 700 mg (08/25/2018), 700 mg (09/08/2018) ondansetron (ZOFRAN) 8 mg in sodium chloride 0.9 % 50 mL IVPB, , Intravenous,  Once, 21 of 21 cycles Administration:  (11/04/2017),  (01/27/2018),  (02/10/2018),  (02/24/2018),  (03/10/2018),  (03/24/2018),  (04/28/2018),  (05/17/2018),  (05/31/2018),  (06/16/2018),  (06/30/2018),  (07/21/2018),  (08/04/2018),  (08/25/2018),  (09/08/2018) fluorouracil (ADRUCIL) 3,850 mg in sodium chloride 0.9 % 73 mL chemo infusion, 2,400 mg/m2 = 3,850 mg, Intravenous, 1 Day/Dose, 22 of 22 cycles Dose modification: 1,920 mg/m2 (original dose 2,400 mg/m2, Cycle 2, Reason: Provider Judgment, Comment: mucisitis) Administration: 3,850 mg  (10/21/2017), 3,100 mg (11/04/2017), 3,100 mg (11/18/2017), 3,100 mg (12/02/2017), 3,100 mg (12/16/2017), 3,100 mg (12/30/2017), 3,100 mg (01/13/2018), 3,100 mg (01/27/2018), 3,100 mg (02/10/2018), 3,100 mg (02/24/2018), 3,100 mg (03/10/2018), 3,100 mg (03/24/2018), 3,100 mg (04/28/2018), 3,100 mg (05/17/2018), 3,100 mg (05/31/2018), 3,100 mg (06/16/2018), 3,100 mg (06/30/2018), 3,100 mg (07/21/2018), 3,100 mg (08/04/2018), 3,100 mg (08/25/2018), 3,100 mg (09/08/2018) irinotecan LIPOSOME (ONIVYDE) 81.7 mg in sodium chloride 0.9 % 500 mL chemo infusion, 50 mg/m2 = 81.7 mg (100 % of original dose 50 mg/m2), Intravenous, Once, 22 of 22 cycles Dose modification: 50 mg/m2 (original dose 50 mg/m2, Cycle 1, Reason: Provider Judgment), 50 mg/m2 (original dose 50 mg/m2, Cycle 2, Reason: Provider Judgment) Administration: 81.7 mg (10/21/2017), 81.7 mg (11/04/2017), 81.7 mg (11/18/2017), 81.7 mg (12/02/2017), 81.7 mg (12/16/2017), 81.7 mg (12/30/2017), 81.7 mg (01/13/2018), 81.7 mg (01/27/2018), 81.7 mg (02/10/2018), 81.7 mg (02/24/2018), 81.7 mg (03/10/2018), 81.7 mg (03/24/2018), 81.7 mg (04/28/2018), 81.7 mg (05/17/2018), 81.7 mg (05/31/2018), 81.7 mg (06/16/2018), 81.7 mg (06/30/2018), 81.7 mg (07/21/2018), 81.7 mg (08/04/2018), 81.7 mg (08/25/2018), 81.7 mg (09/08/2018)  for chemotherapy treatment.    09/22/2018 - 01/11/2019 Chemotherapy   The patient had palonosetron (ALOXI) injection 0.25 mg, 0.25 mg, Intravenous,  Once, 7 of 8 cycles Administration: 0.25 mg (09/22/2018), 0.25 mg (10/11/2018), 0.25 mg (10/25/2018), 0.25 mg (12/01/2018), 0.25 mg (12/15/2018), 0.25 mg (11/17/2018), 0.25 mg (12/29/2018) pegfilgrastim (NEULASTA) injection 6 mg, 6 mg, Subcutaneous, Once, 5 of 5 cycles Administration: 6 mg (10/13/2018), 6 mg (10/27/2018), 6 mg (12/03/2018),  6 mg (12/17/2018), 6 mg (11/19/2018) pegfilgrastim-cbqv (UDENYCA) injection 6 mg, 6 mg, Subcutaneous, Once, 1 of 2 cycles Administration: 6 mg (12/31/2018) leucovorin 628 mg in dextrose 5 % 250 mL infusion, 400 mg/m2 =  628 mg, Intravenous,  Once, 7 of 8 cycles Administration: 628 mg (09/22/2018), 628 mg (10/11/2018), 628 mg (10/25/2018), 628 mg (12/01/2018), 628 mg (12/15/2018), 628 mg (11/17/2018), 628 mg (12/29/2018) oxaliplatin (ELOXATIN) 100 mg in dextrose 5 % 500 mL chemo infusion, 65 mg/m2 = 100 mg (100 % of original dose 65 mg/m2), Intravenous,  Once, 7 of 8 cycles Dose modification: 65 mg/m2 (original dose 65 mg/m2, Cycle 1, Reason: Provider Judgment) Administration: 100 mg (09/22/2018), 100 mg (10/11/2018), 100 mg (10/25/2018), 100 mg (12/01/2018), 100 mg (12/15/2018), 100 mg (11/17/2018), 100 mg (12/29/2018) fluorouracil (ADRUCIL) chemo injection 500 mg, 320 mg/m2 = 500 mg (100 % of original dose 320 mg/m2), Intravenous,  Once, 7 of 8 cycles Dose modification: 320 mg/m2 (original dose 320 mg/m2, Cycle 1, Reason: Provider Judgment) Administration: 500 mg (09/22/2018), 500 mg (10/11/2018), 500 mg (10/25/2018), 500 mg (12/01/2018), 500 mg (12/15/2018), 500 mg (11/17/2018), 500 mg (12/29/2018) fluorouracil (ADRUCIL) 3,000 mg in sodium chloride 0.9 % 90 mL chemo infusion, 1,920 mg/m2 = 3,000 mg (100 % of original dose 1,920 mg/m2), Intravenous, 1 Day/Dose, 7 of 8 cycles Dose modification: 1,920 mg/m2 (original dose 1,920 mg/m2, Cycle 1, Reason: Provider Judgment) Administration: 3,000 mg (09/22/2018), 3,000 mg (10/11/2018), 3,000 mg (10/25/2018), 3,000 mg (12/01/2018), 3,000 mg (12/15/2018), 3,000 mg (11/17/2018), 3,000 mg (12/29/2018)  for chemotherapy treatment.    12/24/2018 Genetic Testing   Negative genetic testing on the common hereditary cancer panel.  The Common Hereditary Gene Panel offered by [facility] includes sequencing and/or deletion duplication testing of the following 48 genes: APC, ATM, AXIN2, BARD1, BMPR1A, BRCA1, BRCA2, BRIP1, CDH1, CDK4, CDKN2A (p14ARF), CDKN2A (p16INK4a), CHEK2, CTNNA1, DICER1, EPCAM (Deletion/duplication testing only), GREM1 (promoter region deletion/duplication testing only), KIT, MEN1, MLH1, MSH2, MSH3, MSH6,  MUTYH, NBN, NF1, NHTL1, PALB2, PDGFRA, PMS2, POLD1, POLE, PTEN, RAD50, RAD51C, RAD51D, RNF43, SDHB, SDHC, SDHD, SMAD4, SMARCA4. STK11, TP53, TSC1, TSC2, and VHL.  The following genes were evaluated for sequence changes only: SDHA and HOXB13 c.251G>A variant only. The report date is December 24, 2018.  DICER1 VUS identified.  This will not change medical decision making.   02/10/2019 -  Chemotherapy   The patient had palonosetron (ALOXI) injection 0.25 mg, 0.25 mg, Intravenous,  Once, 1 of 4 cycles Administration: 0.25 mg (02/10/2019) pegfilgrastim-jmdb (FULPHILA) injection 6 mg, 6 mg, Subcutaneous,  Once, 1 of 4 cycles CISplatin (PLATINOL) 31 mg in sodium chloride 0.9 % 250 mL chemo infusion, 20 mg/m2 = 31 mg (80 % of original dose 25 mg/m2), Intravenous,  Once, 1 of 4 cycles Dose modification: 20 mg/m2 (80 % of original dose 25 mg/m2, Cycle 1, Reason: Other (see comments), Comment: renal dysfunction) Administration: 31 mg (02/10/2019) gemcitabine (GEMZAR) 1,140 mg in sodium chloride 0.9 % 250 mL chemo infusion, 750 mg/m2 = 1,140 mg (75 % of original dose 1,000 mg/m2), Intravenous,  Once, 1 of 4 cycles Dose modification: 750 mg/m2 (75 % of original dose 1,000 mg/m2, Cycle 1, Reason: Provider Judgment) Administration: 1,140 mg (02/10/2019) fosaprepitant (EMEND) 150 mg, dexamethasone (DECADRON) 12 mg in sodium chloride 0.9 % 145 mL IVPB, , Intravenous,  Once, 1 of 4 cycles Administration:  (02/10/2019)  for chemotherapy treatment.       CANCER STAGING: Cancer Staging Pancreatic cancer metastasized to liver [facility]) Staging form: Pancreas, AJCC 7th Edition -  Clinical: Stage IV (T3, N1, M1) - Signed by Baird Cancer, PA-C on 03/16/2014 - Pathologic: No stage assigned - Unsigned    INTERVAL HISTORY:  [name] 85 y.o. female seen for follow-up of metastatic pancreatic cancer to the liver.  She started taking her first cycle of gemcitabine and cisplatin, dose reduced on 02/10/2019.  Denied any  worsening of peripheral neuropathy.  However felt very weak after the first cycle.  Had mild nausea which was controlled.  Her son who is accompanying her today thinks that dizziness is from Dramamine.  Denies any fevers or night sweats.  REVIEW OF SYSTEMS:  Review of Systems  Gastrointestinal: Positive for nausea.  Neurological: Positive for dizziness and numbness.  All other systems reviewed and are negative.    PAST MEDICAL/SURGICAL HISTORY:  Past Medical History:  Diagnosis Date   Anemia due to antineoplastic chemotherapy 09/12/2015   Started Aranesp 500 mcg on 09/12/2015   Anxiety    Chronic diarrhea    Depression    DNR (do not resuscitate) 08/16/2015   Erosive esophagitis    Family history of breast cancer in female    Family history of prostate cancer    GERD (gastroesophageal reflux disease)    Hx of adenomatous colonic polyps    tubular adenomas, last found in 2008   Hyperplastic colon polyp 03/19/10   tcs by Dr. Gala Romney   Hypertension 20 years    Kidney stone    hx/ crushed    Opioid contract exists 04/18/2015   With Dr. Moshe Cipro   Pancreatic cancer [facility]) 02/2014   Pancreatic cancer metastasized to liver ([place]) 03/10/2014   Schatzki's ring 08/26/10   Last dilated on EGD by Dr. Trevor Iha [facility], linear gastric erosions, BI hemigastrectomy   Past Surgical History:  Procedure Laterality Date   BREAST LUMPECTOMY Left    bunion removal     from both [place]  03/19/2010   DR Gala Romney,, normal TI, pancolonic diverticula, random colon bx neg., hyperplastic polyps removed   ESOPHAGOGASTRODUODENOSCOPY  11/22/2003   DR Gala Romney, erosive RE, [place]th I   ESOPHAGOGASTRODUODENOSCOPY  08/26/10   Dr. Gala Romney- moderate severe ERE, Scahtzki ring s/p dilation, [place] I, linear gastric erosions, bx-gastric xanthelasma   LEFT SHOULDER SURGERY  2009   DR HARRISON   ORIF ANKLE FRACTURE Right 05/22/2013   Procedure: OPEN REDUCTION INTERNAL  FIXATION (ORIF) RIGHT ANKLE FRACTURE;  Surgeon: Sanjuana Kava, MD;  Location: [facility];  Service: Orthopedics;  Laterality: Right;   stomach ulcer  50 years ago    had some of her stomach removed      SOCIAL HISTORY:  Social History   Socioeconomic History   Marital status: Widowed    Spouse name: Not on file   Number of children: 2   Years of education: 67   Highest education level: 12th grade  Occupational History   Occupation: retired from Estate manager/land agent: [employer] resource strain: Not hard at all   Food insecurity    Worry: Never true    Inability: Never true   Transportation needs    Medical: No    Non-medical: No  Tobacco Use   Smoking status: Current Some Day Smoker    Packs/day: 0.50    Years: 60.00    Pack years: 30.00    Types: Cigarettes    Last attempt to quit: 09/09/2016    Years since quitting: 2.4  Smokeless tobacco: Never Used  Substance and Sexual Activity   Alcohol use: No   Drug use: No   Sexual activity: Not Currently  Lifestyle   Physical activity    Days per week: 3 days    Minutes per session: 30 min   Stress: Not at all  Relationships   Social connections    Talks on phone: More than three times a week    Gets together: Three times a week    Attends religious service: 1 to 4 times per year    Active member of club or organization: No    Attends meetings of clubs or organizations: Never    Relationship status: Widowed   Intimate partner violence    Fear of current or ex partner: No    Emotionally abused: No    Physically abused: No    Forced sexual activity: No  Other Topics Concern   Not on file  Social History Narrative   Lives with son alone     FAMILY HISTORY:  Family History  Problem Relation Age of Onset   Hypertension Mother    Kidney failure Brother        on dialysis   Diabetes Sister    Diabetes Sister    Hypertension Father    Kidney failure Sister    Kidney  Stones Brother    Breast cancer Son 65   Gout Son 69   Gout Nephew 59   Liver disease Neg Hx    Colon cancer Neg Hx     CURRENT MEDICATIONS:  Outpatient Encounter Medications as of 02/16/2019  Medication Sig   amLODipine (NORVASC) 10 MG tablet TAKE 1 TABLET DAILY   CISPLATIN IV Inject into the vein. Days 1, 8 every 21 days   clobetasol (TEMOVATE) 0.05 % external solution APP AA ON SCALP BID PRN NOT TO FACE GROIN OR UNDERARMS   clonazePAM (KLONOPIN) 0.5 MG tablet Take 1 tablet (0.5 mg total) by mouth at bedtime.   cycloSPORINE (RESTASIS) 0.05 % ophthalmic emulsion Place 1 drop into both eyes 2 (two) times daily.   fluorometholone (FML) 0.1 % ophthalmic suspension INSTILL 1 DROP INTO EACH EYE THREE TIMES DAILY FOR 14 DAYS   GEMCITABINE HCL IV Inject into the vein. Days 1, 8 every 21 days   metoprolol tartrate (LOPRESSOR) 25 MG tablet Take one tablet once daily for blood pressure   MYRBETRIQ 50 MG TB24 tablet TAKE 1 TABLET DAILY   pantoprazole (PROTONIX) 20 MG tablet TAKE 1 TABLET DAILY   acetaminophen (TYLENOL) 500 MG tablet Take 500 mg by mouth every 6 (six) hours as needed for mild pain or moderate pain.    diphenhydrAMINE (BENADRYL) 50 MG tablet Take 1 tablet 1 hour prior to CT (Patient not taking: Reported on 02/16/2019)   fluticasone (FLONASE) 50 MCG/ACT nasal spray Place 1 spray into both nostrils daily.   lidocaine (XYLOCAINE) 2 % solution RINSE WITH 5ML AS NEEDED TO RELIEVE MOUTH PAIN.   lidocaine-prilocaine (EMLA) cream Apply 1 application topically as needed. Apply to portacath site as needed (Patient not taking: Reported on 02/16/2019)   [DISCONTINUED] prochlorperazine (COMPAZINE) 10 MG tablet Take 1 tablet (10 mg total) by mouth every 6 (six) hours as needed (Nausea or vomiting).   No facility-administered encounter medications on file as of 02/16/2019.     ALLERGIES:  Allergies  Allergen Reactions   Iohexol Hives and Shortness Of Breath    **Patient can  not have IV contrast, pt has breakthrough reaction even  after 13 hour premeds** Do not give IV contrast PATIENT HAD TO BE TAKEN TO ED, C/O WHELPS, HIVES, DIFFICULTY BREATHING. PATIENT GIVEN IV CONTRAST AFTER 13 HOUR PRE MEDS ON 01/09/2017 WITH NO REACTION NOTED. On 10/19/17 patient had reaction despite premedication.     Aciphex [Rabeprazole Sodium] Other (See Comments)    unknown   Amlodipine Besylate-Valsartan     Rash to high-dose (5/320)   Esomeprazole Magnesium    Omeprazole Other (See Comments)    Patient states "medication didn't work"   Ciprofloxacin Rash   Penicillins Swelling and Rash    Has patient had a PCN reaction causing immediate rash, facial/tongue/throat swelling, SOB or lightheadedness with hypotension: Yes Has patient had a PCN reaction causing severe rash involving mucus membranes or skin necrosis: Yes Has patient had a PCN reaction that required hospitalization: No Has patient had a PCN reaction occurring within the last 10 years: yes If all of the above answers are "NO", then may proceed with Cephalosporin use.    Tessalon Perles [Benzonatate] Itching    Full head, face, neck, and chest itching.      PHYSICAL EXAM:  ECOG Performance status: 1  Vitals:   02/16/19 0837  BP: (!) 163/49  Pulse: (!) 51  Resp: 20  Temp: (!) 97 F (36.1 C)  SpO2: 100%   Filed Weights   02/16/19 0837  Weight: 116 lb 4.8 oz (52.8 kg)    Physical Exam Vitals signs reviewed.  Constitutional:      Appearance: Normal appearance.  Cardiovascular:     Rate and Rhythm: Normal rate and regular rhythm.     Heart sounds: Normal heart sounds.  Pulmonary:     Effort: Pulmonary effort is normal.     Breath sounds: Normal breath sounds.  Abdominal:     General: There is no distension.     Palpations: Abdomen is soft. There is no mass.  Musculoskeletal:        General: No swelling.  Skin:    General: Skin is warm.  Neurological:     General: No focal deficit present.      Mental Status: She is alert and oriented to person, place, and time.  Psychiatric:        Mood and Affect: Mood normal.        Behavior: Behavior normal.      LABORATORY DATA:  I have reviewed the labs as listed.  CBC    Component Value Date/Time   WBC 3.6 (L) 02/16/2019 0832   RBC 3.18 (L) 02/16/2019 0832   HGB 9.7 (L) 02/16/2019 0832   HCT 29.7 (L) 02/16/2019 0832   PLT 69 (L) 02/16/2019 0832   MCV 93.4 02/16/2019 0832   MCH 30.5 02/16/2019 0832   MCHC 32.7 02/16/2019 0832   RDW 15.4 02/16/2019 0832   LYMPHSABS 0.9 02/16/2019 0832   MONOABS 0.1 02/16/2019 0832   EOSABS 0.1 02/16/2019 0832   BASOSABS 0.0 02/16/2019 0832   CMP Latest Ref Rng & Units 02/16/2019 02/10/2019 01/12/2019  Glucose 70 - 99 mg/dL 104(H) 112(H) 109(H)  BUN 8 - 23 mg/dL 31(H) 26(H) 22  Creatinine 0.44 - 1.00 mg/dL 1.06(H) 1.21(H) 1.20(H)  Sodium 135 - 145 mmol/L 137 141 141  Potassium 3.5 - 5.1 mmol/L 4.2 3.9 3.3(L)  Chloride 98 - 111 mmol/L 103 105 113(H)  CO2 22 - 32 mmol/L _0 Calcium 8.9 - 10.3 mg/dL 8.3(L) 8.6(L) 8.0(L)  Total Protein 6.5 - 8.1 g/dL 7.1 7.7 7.2  Total Bilirubin 0.3 - 1.2 mg/dL 0.5 0.8 0.6  Alkaline Phos 38 - 126 U/L 127(H) 136(H) 187(H)  AST 15 - 41 U/L _0 ALT 0 - 44 U/L _1 DIAGNOSTIC IMAGING:  I have independently reviewed the scans and discussed with the patient.   I have reviewed Venita Lick LPN's note and agree with the documentation.  I personally performed a face-to-face visit, made revisions and my assessment and plan is as follows.    ASSESSMENT & PLAN:   Pancreatic cancer metastasized to liver ([place]) 1.  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 - Gemcitabine and Abraxane from normal 2015 through August 2018 with progression. -Gemcitabine and Tarceva from 01/28/2017 through 10/07/2017 with progression. -21 cycles of infusional 5-FU and  Onivyde from 10/21/2017-09/08/2018. -7 cycles of FOLFOX from 09/22/2018-12/29/2018. -CT CAP on 01/10/2019 showed increase in size of dominant hepatic lesion near the dome measuring 4.7 cm, previously 2.6 cm.  Other lesions in the liver are stable.  No new lesions seen.  No lesions in the lungs. - I discussed about available clinical trial at [facility] for K-ras G 12 V mutations.  She does not want to travel that far. 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  MSI high was not detected. - Cycle 1 of gemcitabine (750 mg/M square) and cisplatin (20 mg/M square) on 02/10/2019. -He felt very weak after the first treatment.  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  She also felt lightheaded. - I reviewed her blood work.  She will receive 1 L of fluid today.  I will hold her chemotherapy today. -She will come back for day 8 of treatment next week.  I might consider changing her regimen to day 1 and day 15 every 28 days.  2.  Weight loss: -She has itching and rash when she was drinking Ensure breeze. -She could not tolerate regular Ensure secondary to stomach pain.  She lost 1 pound.  3.  Genetic testing: -Germline mutation testing was negative on 12/24/2018.  Total time spent is 25 minutes with more than 50% of the time spent face-to-face discussing new treatment plan, counseling and coordination of care.    Orders placed this encounter:  No orders of the defined types were placed in this encounter.     Derek Jack, MD [place] [phone]

## 2019-02-16 NOTE — Patient Instructions (Addendum)
[facility] at [facility] Discharge Instructions  You were seen today by Dr. Katragadda. He went over your recent lab results. He will see you back in 1 week for labs and follow up.   Thank you for choosing [facility] at [facility] to provide your oncology and hematology care.  To afford each patient quality time with our provider, please arrive at least 15 minutes before your scheduled appointment time.   If you have a lab appointment with the Cancer Center please come in thru the  Main Entrance and check in at the main information desk  You need to re-schedule your appointment should you arrive 10 or more minutes late.  We strive to give you quality time with our providers, and arriving late affects you and other patients whose appointments are after yours.  Also, if you no show three or more times for appointments you may be dismissed from the clinic at the providers discretion.     Again, thank you for choosing [facility].  Our hope is that these requests will decrease the amount of time that you wait before being seen by our physicians.       _____________________________________________________________  Should you have questions after your visit to [facility], please contact our office at [phone] between the hours of 8:00 a.m. and 4:30 p.m.  Voicemails left after 4:00 p.m. will not be returned until the following business day.  For prescription refill requests, have your pharmacy contact our office and allow 72 hours.    Cancer Center Support Programs:   > Cancer Support Group  2nd Tuesday of the month 1pm-2pm, Journey Room    

## 2019-02-16 NOTE — Progress Notes (Signed)
Pt presents today for treatment and f/u visit with Dr. Shirlee Latch. VS within parameters for tx. Labs pending.   Message received to HOLD tx today.  Per message give 1L of hydration at this time. VO received by RLockamy NP okay to infuse over 2 hours.   IV hydration given today per MD orders. Tolerated infusion without adverse affects. Vital signs stable. No complaints at this time. Discharged from clinic ambulatory. F/U with [facility] as scheduled.

## 2019-02-16 NOTE — Patient Instructions (Signed)
[facility] at [facility]  Discharge Instructions:   _______________________________________________________________  Thank you for choosing [facility] at [facility] to provide your oncology and hematology care.  To afford each patient quality time with our providers, please arrive at least 15 minutes before your scheduled appointment.  You need to re-schedule your appointment if you arrive 10 or more minutes late.  We strive to give you quality time with our providers, and arriving late affects you and other patients whose appointments are after yours.  Also, if you no show three or more times for appointments you may be dismissed from the clinic.  Again, thank you for choosing [facility] at [facility]. Our hope is that these requests will allow you access to exceptional care and in a timely manner. _______________________________________________________________  If you have questions after your visit, please contact our office at [phone] between the hours of 8:30 a.m. and 5:00 p.m. Voicemails left after 4:30 p.m. will not be returned until the following business day. _______________________________________________________________  For prescription refill requests, have your pharmacy contact our office. _______________________________________________________________  Recommendations made by the consultant and any test results will be sent to your referring physician. _______________________________________________________________ 

## 2019-02-16 NOTE — Assessment & Plan Note (Signed)
1.  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 - Gemcitabine and Abraxane from normal 2015 through August 2018 with progression. -Gemcitabine and Tarceva from 01/28/2017 through 10/07/2017 with progression. -21 cycles of infusional 5-FU and Onivyde from 10/21/2017-09/08/2018. -7 cycles of FOLFOX from 09/22/2018-12/29/2018. -CT CAP on 01/10/2019 showed increase in size of dominant hepatic lesion near the dome measuring 4.7 cm, previously 2.6 cm.  Other lesions in the liver are stable.  No new lesions seen.  No lesions in the lungs. - I discussed about available clinical trial at [facility] for K-ras G 12 V mutations.  She does not want to travel that far. 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  MSI high was not detected. - Cycle 1 of gemcitabine (750 mg/M square) and cisplatin (20 mg/M square) on 02/10/2019. -He felt very weak after the first treatment.  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  She also felt lightheaded. - I reviewed her blood work.  She will receive 1 L of fluid today.  I will hold her chemotherapy today. -She will come back for day 8 of treatment next week.  I might consider changing her regimen to day 1 and day 15 every 28 days.  2.  Weight loss: -She has itching and rash when she was drinking Ensure breeze. -She could not tolerate regular Ensure secondary to stomach pain.  She lost 1 pound.  3.  Genetic testing: -Germline mutation testing was negative on 12/24/2018.

## 2019-02-23 ENCOUNTER — Other Ambulatory Visit: Payer: Self-pay

## 2019-02-24 ENCOUNTER — Inpatient Hospital Stay (HOSPITAL_BASED_OUTPATIENT_CLINIC_OR_DEPARTMENT_OTHER): Payer: Medicare HMO | Admitting: Hematology

## 2019-02-24 ENCOUNTER — Other Ambulatory Visit (HOSPITAL_COMMUNITY): Payer: Self-pay | Admitting: *Deleted

## 2019-02-24 ENCOUNTER — Encounter (HOSPITAL_COMMUNITY): Payer: Self-pay | Admitting: Hematology

## 2019-02-24 ENCOUNTER — Inpatient Hospital Stay (HOSPITAL_COMMUNITY): Payer: Medicare HMO

## 2019-02-24 VITALS — BP 188/58 | HR 62 | Temp 97.6°F | Resp 16 | Wt 116.4 lb

## 2019-02-24 DIAGNOSIS — C787 Secondary malignant neoplasm of liver and intrahepatic bile duct: Secondary | ICD-10-CM | POA: Diagnosis not present

## 2019-02-24 DIAGNOSIS — N3941 Urge incontinence: Secondary | ICD-10-CM | POA: Diagnosis not present

## 2019-02-24 DIAGNOSIS — C259 Malignant neoplasm of pancreas, unspecified: Secondary | ICD-10-CM

## 2019-02-24 DIAGNOSIS — Z5111 Encounter for antineoplastic chemotherapy: Secondary | ICD-10-CM | POA: Diagnosis not present

## 2019-02-24 DIAGNOSIS — C252 Malignant neoplasm of tail of pancreas: Secondary | ICD-10-CM | POA: Diagnosis not present

## 2019-02-24 DIAGNOSIS — Z5189 Encounter for other specified aftercare: Secondary | ICD-10-CM | POA: Diagnosis not present

## 2019-02-24 LAB — COMPREHENSIVE METABOLIC PANEL
ALT: 13 U/L (ref 0–44)
AST: 17 U/L (ref 15–41)
Albumin: 3.6 g/dL (ref 3.5–5.0)
Alkaline Phosphatase: 146 U/L — ABNORMAL HIGH (ref 38–126)
Anion gap: 10 (ref 5–15)
BUN: 28 mg/dL — ABNORMAL HIGH (ref 8–23)
CO2: 22 mmol/L (ref 22–32)
Calcium: 8.3 mg/dL — ABNORMAL LOW (ref 8.9–10.3)
Chloride: 106 mmol/L (ref 98–111)
Creatinine, Ser: 1.28 mg/dL — ABNORMAL HIGH (ref 0.44–1.00)
GFR calc Af Amer: 43 mL/min — ABNORMAL LOW (ref >60)
GFR calc non Af Amer: 37 mL/min — ABNORMAL LOW (ref >60)
Glucose, Bld: 93 mg/dL (ref 70–99)
Potassium: 3.6 mmol/L (ref 3.5–5.1)
Sodium: 138 mmol/L (ref 135–145)
Total Bilirubin: 0.3 mg/dL (ref 0.3–1.2)
Total Protein: 7.4 g/dL (ref 6.5–8.1)

## 2019-02-24 LAB — CBC WITH DIFFERENTIAL/PLATELET
Abs Immature Granulocytes: 0.01 10*3/uL (ref 0.00–0.07)
Basophils Absolute: 0 10*3/uL (ref 0.0–0.1)
Basophils Relative: 0 %
Eosinophils Absolute: 0.1 10*3/uL (ref 0.0–0.5)
Eosinophils Relative: 2 %
HCT: 29.3 % — ABNORMAL LOW (ref 36.0–46.0)
Hemoglobin: 9.5 g/dL — ABNORMAL LOW (ref 12.0–15.0)
Immature Granulocytes: 0 %
Lymphocytes Relative: 32 %
Lymphs Abs: 1.1 10*3/uL (ref 0.7–4.0)
MCH: 30.3 pg (ref 26.0–34.0)
MCHC: 32.4 g/dL (ref 30.0–36.0)
MCV: 93.3 fL (ref 80.0–100.0)
Monocytes Absolute: 0.3 10*3/uL (ref 0.1–1.0)
Monocytes Relative: 7 %
Neutro Abs: 2 10*3/uL (ref 1.7–7.7)
Neutrophils Relative %: 59 %
Platelets: 75 10*3/uL — ABNORMAL LOW (ref 150–400)
RBC: 3.14 MIL/uL — ABNORMAL LOW (ref 3.87–5.11)
RDW: 15.5 % (ref 11.5–15.5)
WBC: 3.5 10*3/uL — ABNORMAL LOW (ref 4.0–10.5)
nRBC: 0 % (ref 0.0–0.2)

## 2019-02-24 LAB — URINALYSIS, ROUTINE W REFLEX MICROSCOPIC
Bacteria, UA: NONE SEEN
Bilirubin Urine: NEGATIVE
Glucose, UA: NEGATIVE mg/dL
Ketones, ur: NEGATIVE mg/dL
Leukocytes,Ua: NEGATIVE
Nitrite: NEGATIVE
Protein, ur: 100 mg/dL — AB
Specific Gravity, Urine: 1.008 (ref 1.005–1.030)
pH: 6 (ref 5.0–8.0)

## 2019-02-24 LAB — LACTATE DEHYDROGENASE: LDH: 120 U/L (ref 98–192)

## 2019-02-24 LAB — MAGNESIUM: Magnesium: 2.1 mg/dL (ref 1.7–2.4)

## 2019-02-24 MED ORDER — HEPARIN SOD (PORK) LOCK FLUSH 100 UNIT/ML IV SOLN
500.0000 [IU] | Freq: Once | INTRAVENOUS | Status: AC | PRN
  Administered 2019-02-24: 500 [IU]

## 2019-02-24 MED ORDER — SODIUM CHLORIDE 0.9 % IV SOLN
Freq: Once | INTRAVENOUS | Status: AC
  Administered 2019-02-24: 12:00:00 via INTRAVENOUS
  Filled 2019-02-24: qty 5

## 2019-02-24 MED ORDER — POTASSIUM CHLORIDE 2 MEQ/ML IV SOLN
Freq: Once | INTRAVENOUS | Status: AC
  Administered 2019-02-24: 10:00:00 via INTRAVENOUS
  Filled 2019-02-24: qty 10

## 2019-02-24 MED ORDER — SODIUM CHLORIDE 0.9 % IV SOLN: Freq: Once | INTRAVENOUS | Status: AC

## 2019-02-24 MED ORDER — SODIUM CHLORIDE 0.9% FLUSH
10.0000 mL | INTRAVENOUS | Status: DC | PRN
  Administered 2019-02-24: 12:00:00 10 mL
  Filled 2019-02-24: qty 10

## 2019-02-24 MED ORDER — SODIUM CHLORIDE 0.9 % IV SOLN
Freq: Once | INTRAVENOUS | Status: AC
  Administered 2019-02-24: 09:00:00 via INTRAVENOUS

## 2019-02-24 MED ORDER — SODIUM CHLORIDE 0.9 % IV SOLN
750.0000 mg/m2 | Freq: Once | INTRAVENOUS | Status: AC
  Administered 2019-02-24: 1140 mg via INTRAVENOUS
  Filled 2019-02-24: qty 5.26

## 2019-02-24 MED ORDER — PALONOSETRON HCL INJECTION 0.25 MG/5ML
0.2500 mg | Freq: Once | INTRAVENOUS | Status: AC
  Administered 2019-02-24: 0.25 mg via INTRAVENOUS
  Filled 2019-02-24: qty 5

## 2019-02-24 MED ORDER — SODIUM CHLORIDE 0.9 % IV SOLN
20.0000 mg/m2 | Freq: Once | INTRAVENOUS | Status: AC
  Administered 2019-02-24: 31 mg via INTRAVENOUS
  Filled 2019-02-24: qty 31

## 2019-02-24 NOTE — Patient Instructions (Addendum)
[facility] at [facility] Discharge Instructions  You were seen today by Dr. Katragadda. He went over your recent lab results. He will see you back in 2 weeks for labs, treatment and follow up.   Thank you for choosing [facility] at [facility] to provide your oncology and hematology care.  To afford each patient quality time with our provider, please arrive at least 15 minutes before your scheduled appointment time.   If you have a lab appointment with the Cancer Center please come in thru the  Main Entrance and check in at the main information desk  You need to re-schedule your appointment should you arrive 10 or more minutes late.  We strive to give you quality time with our providers, and arriving late affects you and other patients whose appointments are after yours.  Also, if you no show three or more times for appointments you may be dismissed from the clinic at the providers discretion.     Again, thank you for choosing [facility].  Our hope is that these requests will decrease the amount of time that you wait before being seen by our physicians.       _____________________________________________________________  Should you have questions after your visit to [facility], please contact our office at [phone] between the hours of 8:00 a.m. and 4:30 p.m.  Voicemails left after 4:00 p.m. will not be returned until the following business day.  For prescription refill requests, have your pharmacy contact our office and allow 72 hours.    Cancer Center Support Programs:   > Cancer Support Group  2nd Tuesday of the month 1pm-2pm, Journey Room    

## 2019-02-24 NOTE — Progress Notes (Signed)
Labs reviewed with MD today. Proceed with treatment per orders.   Treatment given per orders. Patient tolerated it well without problems. Vitals stable and discharged home from clinic ambulatory. Follow up as scheduled.

## 2019-02-24 NOTE — Assessment & Plan Note (Signed)
1.  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 - Gemcitabine and Abraxane from normal 2015 through August 2018 with progression. -Gemcitabine and Tarceva from 01/28/2017 through 10/07/2017 with progression. -21 cycles of infusional 5-FU and Onivyde from 10/21/2017-09/08/2018. -7 cycles of FOLFOX from 09/22/2018-12/29/2018. -CT CAP on 01/10/2019 showed increase in size of dominant hepatic lesion near the dome measuring 4.7 cm, previously 2.6 cm.  Other lesions in the liver are stable.  No new lesions seen.  No lesions in the lungs. - I discussed about available clinical trial at [facility] for K-ras G 12 V mutations.  She does not want to travel that far. 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  MSI high was not detected. - Cycle 1 of gemcitabine (750 mg/M square) and cisplatin (20 mg/M square) on 02/10/2019. -She could not receive chemotherapy on date as she was feeling very weak. -Today she feels better.  She will proceed with her treatment today. -She will come back in 2 weeks for follow-up.  I plan to change her treatment to day 1 and day 15 every 28 days.  2.  Weight loss: -Her weight has been stable in the last 2 visits.  3.  Genetic testing: -Germline mutation testing was negative on 12/24/2018.

## 2019-02-24 NOTE — Progress Notes (Signed)
[place], [place] [address]   CLINIC:  Medical Oncology/Hematology  PCP:  Fayrene Helper, MD [address], [place] [address] [phone]   REASON FOR VISIT:  Follow-up for metastatic pancreatic cancer to the liver   BRIEF ONCOLOGIC HISTORY:  Oncology History  Pancreatic cancer metastasized to liver ([place])  03/10/2014 Initial Diagnosis   Pancreatic cancer metastasized to liver   03/22/2014 - 03/05/2016 Chemotherapy   Abraxane/Gemzar days 1, 8, every 28 days.  Day 15 was cancelled due to leukopenia and thrombocytopenia on day 15 cycle 1.   05/24/2014 Treatment Plan Change   Day 8 of cycle 3 is held with ANC of 1.1   06/05/2014 Imaging   CT C/A/P . Interval decrease in size of the pancreatic tail mass.Improved hepatic metastatic disease. No new lesions. No CT findings for metastatic disease involving the chest.   08/09/2014 Tumor Marker   CA 19-9= 33 (WNL)   10/26/2014 Imaging   MRI- Continued interval decrease in size of the hepatic metastatic lesions and no new lesions are identified. Continued decrease in size of the pancreatic tail lesion.   01/03/2015 Tumor Marker   Results for [name], [name] (MRN [id number]) as of 01/18/2015 12:52  01/03/2015 10:00 CA 19-9: 14    01/17/2015 Imaging   MRI- Response to therapy of hepatic metastasis.  Similar size of a pancreatic tail lesion.   03/20/2015 Imaging   MRI-L spine- Severe disc space narrowing at L2-L3, with endplate reactive changes. Large disc extrusion into the ventral epidural space,central to the RIGHT with a cephalad migrated free fragment. Additional Large disc extrusion into the retroperitone...   03/22/2015 Imaging   CT pelvis- No evidence for metastatic disease within the pelvis.   07/24/2015 Imaging   MRI abd- Continued response to therapy, with no residual detectable liver metastases. No new sites of metastatic disease in the abdomen.    08/15/2015 Code Status     She confirms desire for DNR status.   12/21/2015 Imaging   MRI liver- Severe image degradation due to motion artifact reducing diagnostic sensitivity and specificity. Reduce conspicuity of the pancreatic tail lesion suggesting further improvement. The original liver lesions have resolved.   03/05/2016 Treatment Plan Change   Chemotherapy holiday/Break after 2 years worth of treatment   04/07/2016 Imaging   CT chest- When compared to recent chest CT, new minimally displaced anterior left sixth rib fracture. Slight increase in subcarinal adenopathy   04/28/2016 Imaging   Bone density- BMD as determined from Femur Neck Right is 0.705 g/cm2 with a T-Score of -2.4. This patient is considered osteopenic according to [facility]) criteria. Compared with the prior study on 02/23/2013, the BMD of the lumbar spine/rt. femoral neck show a statistically significant decrease.    05/23/2016 Imaging   MRI abd- 1. Exam is significantly degraded by patient respiratory motion. Consider follow-up exams with CT abdomen with without contrast per pancreatic protocol. 2. Fullness in tail of pancreas is more prominent and potentially increased in size. Recommend close attention on follow-up. (Consider CT as above) 3. No explanation for back pain.   09/11/2016 Imaging   MRI pancreas- Interval progression of pancreatic tail lesion. New differential perfusion right liver with areas of heterogeneity. Underlying metastatic disease in this region not excluded.   09/11/2016 Progression   MRi in conjunction with rising CA 19-9 are indicative of relapse of disease.   01/09/2017 Progression   CT C/A/P: 2.1 x 2.9  cm lesion in the pancreatic tail and abutting the splenic hilum, corresponding to known primary pancreatic neoplasm, mildly increased.  Scattered small hypoenhancing lesions in the liver measuring up to 10 mm, approximately 8-10 in number, suspicious for hepatic metastases.  No  findings specific for metastatic disease in the chest.  Additional ancillary findings as above.    01/27/2017 - 10/20/2017 Chemotherapy   The patient had gemcitabine (GEMZAR) 1,292 mg in sodium chloride 0.9 % 250 mL chemo infusion, 800 mg/m2 = 1,292 mg, Intravenous,  Once, 9 of 12 cycles Administration: 1,292 mg (01/28/2017), 1,292 mg (02/11/2017), 1,292 mg (02/25/2017), 1,292 mg (03/11/2017), 1,292 mg (03/25/2017), 1,292 mg (04/08/2017), 1,292 mg (04/22/2017), 1,292 mg (04/29/2017), 1,292 mg (05/20/2017), 1,292 mg (06/03/2017), 1,292 mg (06/24/2017), 1,292 mg (07/08/2017), 1,292 mg (07/29/2017), 1,292 mg (08/12/2017), 1,292 mg (08/26/2017), 1,292 mg (09/09/2017), 1,292 mg (09/23/2017), 1,292 mg (10/07/2017)  for chemotherapy treatment.    10/21/2017 - 09/22/2018 Chemotherapy   The patient had leucovorin 700 mg in dextrose 5 % 250 mL infusion, 644 mg, Intravenous,  Once, 22 of 22 cycles Administration: 700 mg (10/21/2017), 700 mg (11/04/2017), 700 mg (11/18/2017), 700 mg (12/02/2017), 700 mg (12/16/2017), 700 mg (12/30/2017), 700 mg (01/13/2018), 700 mg (01/27/2018), 700 mg (02/10/2018), 700 mg (02/24/2018), 700 mg (03/10/2018), 700 mg (03/24/2018), 700 mg (04/28/2018), 700 mg (05/17/2018), 700 mg (05/31/2018), 700 mg (06/16/2018), 700 mg (06/30/2018), 700 mg (07/21/2018), 700 mg (08/04/2018), 700 mg (08/25/2018), 700 mg (09/08/2018) ondansetron (ZOFRAN) 8 mg in sodium chloride 0.9 % 50 mL IVPB, , Intravenous,  Once, 21 of 21 cycles Administration:  (11/04/2017),  (01/27/2018),  (02/10/2018),  (02/24/2018),  (03/10/2018),  (03/24/2018),  (04/28/2018),  (05/17/2018),  (05/31/2018),  (06/16/2018),  (06/30/2018),  (07/21/2018),  (08/04/2018),  (08/25/2018),  (09/08/2018) fluorouracil (ADRUCIL) 3,850 mg in sodium chloride 0.9 % 73 mL chemo infusion, 2,400 mg/m2 = 3,850 mg, Intravenous, 1 Day/Dose, 22 of 22 cycles Dose modification: 1,920 mg/m2 (original dose 2,400 mg/m2, Cycle 2, Reason: Provider Judgment, Comment: mucisitis) Administration: 3,850 mg  (10/21/2017), 3,100 mg (11/04/2017), 3,100 mg (11/18/2017), 3,100 mg (12/02/2017), 3,100 mg (12/16/2017), 3,100 mg (12/30/2017), 3,100 mg (01/13/2018), 3,100 mg (01/27/2018), 3,100 mg (02/10/2018), 3,100 mg (02/24/2018), 3,100 mg (03/10/2018), 3,100 mg (03/24/2018), 3,100 mg (04/28/2018), 3,100 mg (05/17/2018), 3,100 mg (05/31/2018), 3,100 mg (06/16/2018), 3,100 mg (06/30/2018), 3,100 mg (07/21/2018), 3,100 mg (08/04/2018), 3,100 mg (08/25/2018), 3,100 mg (09/08/2018) irinotecan LIPOSOME (ONIVYDE) 81.7 mg in sodium chloride 0.9 % 500 mL chemo infusion, 50 mg/m2 = 81.7 mg (100 % of original dose 50 mg/m2), Intravenous, Once, 22 of 22 cycles Dose modification: 50 mg/m2 (original dose 50 mg/m2, Cycle 1, Reason: Provider Judgment), 50 mg/m2 (original dose 50 mg/m2, Cycle 2, Reason: Provider Judgment) Administration: 81.7 mg (10/21/2017), 81.7 mg (11/04/2017), 81.7 mg (11/18/2017), 81.7 mg (12/02/2017), 81.7 mg (12/16/2017), 81.7 mg (12/30/2017), 81.7 mg (01/13/2018), 81.7 mg (01/27/2018), 81.7 mg (02/10/2018), 81.7 mg (02/24/2018), 81.7 mg (03/10/2018), 81.7 mg (03/24/2018), 81.7 mg (04/28/2018), 81.7 mg (05/17/2018), 81.7 mg (05/31/2018), 81.7 mg (06/16/2018), 81.7 mg (06/30/2018), 81.7 mg (07/21/2018), 81.7 mg (08/04/2018), 81.7 mg (08/25/2018), 81.7 mg (09/08/2018)  for chemotherapy treatment.    09/22/2018 - 01/11/2019 Chemotherapy   The patient had palonosetron (ALOXI) injection 0.25 mg, 0.25 mg, Intravenous,  Once, 7 of 8 cycles Administration: 0.25 mg (09/22/2018), 0.25 mg (10/11/2018), 0.25 mg (10/25/2018), 0.25 mg (12/01/2018), 0.25 mg (12/15/2018), 0.25 mg (11/17/2018), 0.25 mg (12/29/2018) pegfilgrastim (NEULASTA) injection 6 mg, 6 mg, Subcutaneous, Once, 5 of 5 cycles Administration: 6 mg (10/13/2018), 6 mg (10/27/2018), 6 mg (12/03/2018), 6  mg (12/17/2018), 6 mg (11/19/2018) pegfilgrastim-cbqv (UDENYCA) injection 6 mg, 6 mg, Subcutaneous, Once, 1 of 2 cycles Administration: 6 mg (12/31/2018) leucovorin 628 mg in dextrose 5 % 250 mL infusion, 400 mg/m2 =  628 mg, Intravenous,  Once, 7 of 8 cycles Administration: 628 mg (09/22/2018), 628 mg (10/11/2018), 628 mg (10/25/2018), 628 mg (12/01/2018), 628 mg (12/15/2018), 628 mg (11/17/2018), 628 mg (12/29/2018) oxaliplatin (ELOXATIN) 100 mg in dextrose 5 % 500 mL chemo infusion, 65 mg/m2 = 100 mg (100 % of original dose 65 mg/m2), Intravenous,  Once, 7 of 8 cycles Dose modification: 65 mg/m2 (original dose 65 mg/m2, Cycle 1, Reason: Provider Judgment) Administration: 100 mg (09/22/2018), 100 mg (10/11/2018), 100 mg (10/25/2018), 100 mg (12/01/2018), 100 mg (12/15/2018), 100 mg (11/17/2018), 100 mg (12/29/2018) fluorouracil (ADRUCIL) chemo injection 500 mg, 320 mg/m2 = 500 mg (100 % of original dose 320 mg/m2), Intravenous,  Once, 7 of 8 cycles Dose modification: 320 mg/m2 (original dose 320 mg/m2, Cycle 1, Reason: Provider Judgment) Administration: 500 mg (09/22/2018), 500 mg (10/11/2018), 500 mg (10/25/2018), 500 mg (12/01/2018), 500 mg (12/15/2018), 500 mg (11/17/2018), 500 mg (12/29/2018) fluorouracil (ADRUCIL) 3,000 mg in sodium chloride 0.9 % 90 mL chemo infusion, 1,920 mg/m2 = 3,000 mg (100 % of original dose 1,920 mg/m2), Intravenous, 1 Day/Dose, 7 of 8 cycles Dose modification: 1,920 mg/m2 (original dose 1,920 mg/m2, Cycle 1, Reason: Provider Judgment) Administration: 3,000 mg (09/22/2018), 3,000 mg (10/11/2018), 3,000 mg (10/25/2018), 3,000 mg (12/01/2018), 3,000 mg (12/15/2018), 3,000 mg (11/17/2018), 3,000 mg (12/29/2018)  for chemotherapy treatment.    12/24/2018 Genetic Testing   Negative genetic testing on the common hereditary cancer panel.  The Common Hereditary Gene Panel offered by [facility] includes sequencing and/or deletion duplication testing of the following 48 genes: APC, ATM, AXIN2, BARD1, BMPR1A, BRCA1, BRCA2, BRIP1, CDH1, CDK4, CDKN2A (p14ARF), CDKN2A (p16INK4a), CHEK2, CTNNA1, DICER1, EPCAM (Deletion/duplication testing only), GREM1 (promoter region deletion/duplication testing only), KIT, MEN1, MLH1, MSH2, MSH3, MSH6,  MUTYH, NBN, NF1, NHTL1, PALB2, PDGFRA, PMS2, POLD1, POLE, PTEN, RAD50, RAD51C, RAD51D, RNF43, SDHB, SDHC, SDHD, SMAD4, SMARCA4. STK11, TP53, TSC1, TSC2, and VHL.  The following genes were evaluated for sequence changes only: SDHA and HOXB13 c.251G>A variant only. The report date is December 24, 2018.  DICER1 VUS identified.  This will not change medical decision making.   02/10/2019 -  Chemotherapy   The patient had palonosetron (ALOXI) injection 0.25 mg, 0.25 mg, Intravenous,  Once, 1 of 4 cycles Administration: 0.25 mg (02/10/2019), 0.25 mg (02/24/2019) pegfilgrastim-jmdb (FULPHILA) injection 6 mg, 6 mg, Subcutaneous,  Once, 1 of 4 cycles CISplatin (PLATINOL) 31 mg in sodium chloride 0.9 % 250 mL chemo infusion, 20 mg/m2 = 31 mg (80 % of original dose 25 mg/m2), Intravenous,  Once, 1 of 4 cycles Dose modification: 20 mg/m2 (80 % of original dose 25 mg/m2, Cycle 1, Reason: Other (see comments), Comment: renal dysfunction) Administration: 31 mg (02/10/2019), 31 mg (02/24/2019) gemcitabine (GEMZAR) 1,140 mg in sodium chloride 0.9 % 250 mL chemo infusion, 750 mg/m2 = 1,140 mg (75 % of original dose 1,000 mg/m2), Intravenous,  Once, 1 of 4 cycles Dose modification: 750 mg/m2 (75 % of original dose 1,000 mg/m2, Cycle 1, Reason: Provider Judgment) Administration: 1,140 mg (02/10/2019), 1,140 mg (02/24/2019) fosaprepitant (EMEND) 150 mg, dexamethasone (DECADRON) 12 mg in sodium chloride 0.9 % 145 mL IVPB, , Intravenous,  Once, 1 of 4 cycles Administration:  (02/10/2019),  (02/24/2019)  for chemotherapy treatment.       CANCER STAGING: Cancer Staging Pancreatic cancer  metastasized to liver [facility]) Staging form: Pancreas, AJCC 7th Edition - Clinical: Stage IV (T3, N1, M1) - Signed by Baird Cancer, PA-C on 03/16/2014 - Pathologic: No stage assigned - Unsigned    INTERVAL HISTORY:  [name] 85 y.o. female seen for follow-up of metastatic pancreatic cancer to the liver.  She received first cycle of  gemcitabine and cisplatin on 02/10/2019.  Appetite and energy levels are 50%.  Notes improvement in her energy levels from last visit 1 week ago.  Numbness at times in the bottom of the feet has been stable.  Occasional nausea was present last week which was controlled well.  Denies any vomiting.  REVIEW OF SYSTEMS:  Review of Systems  Gastrointestinal: Positive for nausea.  Neurological: Positive for dizziness and numbness.  All other systems reviewed and are negative.    PAST MEDICAL/SURGICAL HISTORY:  Past Medical History:  Diagnosis Date  . Anemia due to antineoplastic chemotherapy 09/12/2015   Started Aranesp 500 mcg on 09/12/2015  . Anxiety   . Chronic diarrhea   . Depression   . DNR (do not resuscitate) 08/16/2015  . Erosive esophagitis   . Family history of breast cancer in female   . Family history of prostate cancer   . GERD (gastroesophageal reflux disease)   . Hx of adenomatous colonic polyps    tubular adenomas, last found in 2008  . Hyperplastic colon polyp 03/19/10   tcs by Dr. Gala Romney  . Hypertension 20 years   . Kidney stone    hx/ crushed   . Opioid contract exists 04/18/2015   With Dr. Moshe Cipro  . Pancreatic cancer ([place]) 02/2014  . Pancreatic cancer metastasized to liver ([place]) 03/10/2014  . Schatzki's ring 08/26/10   Last dilated on EGD by Dr. Trevor Iha [facility], linear gastric erosions, BI hemigastrectomy   Past Surgical History:  Procedure Laterality Date  . BREAST LUMPECTOMY Left   . bunion removal     from both feet   . CHOLECYSTECTOMY  1965   . COLONOSCOPY  03/19/2010   DR Gala Romney,, normal TI, pancolonic diverticula, random colon bx neg., hyperplastic polyps removed  . ESOPHAGOGASTRODUODENOSCOPY  11/22/2003   DR Gala Romney, erosive RE, Billroth I  . ESOPHAGOGASTRODUODENOSCOPY  08/26/10   Dr. Gala Romney- moderate severe ERE, Scahtzki ring s/p dilation, Billroth I, linear gastric erosions, bx-gastric xanthelasma  . LEFT SHOULDER SURGERY  2009   DR HARRISON  . ORIF ANKLE  FRACTURE Right 05/22/2013   Procedure: OPEN REDUCTION INTERNAL FIXATION (ORIF) RIGHT ANKLE FRACTURE;  Surgeon: Sanjuana Kava, MD;  Location: [facility];  Service: Orthopedics;  Laterality: Right;  . stomach ulcer  50 years ago    had some of her stomach removed      SOCIAL HISTORY:  Social History   Socioeconomic History  . Marital status: Widowed    Spouse name: Not on file  . Number of children: 2  . Years of education: 16  . Highest education level: 12th grade  Occupational History  . Occupation: retired from Estate manager/land agent: RETIRED  Social Needs  . Financial resource strain: Not hard at all  . Food insecurity    Worry: Never true    Inability: Never true  . Transportation needs    Medical: No    Non-medical: No  Tobacco Use  . Smoking status: Current Some Day Smoker    Packs/day: 0.50    Years: 60.00    Pack years: 30.00    Types: Cigarettes    Last  attempt to quit: 09/09/2016    Years since quitting: 2.4  . Smokeless tobacco: Never Used  Substance and Sexual Activity  . Alcohol use: No  . Drug use: No  . Sexual activity: Not Currently  Lifestyle  . Physical activity    Days per week: 3 days    Minutes per session: 30 min  . Stress: Not at all  Relationships  . Social connections    Talks on phone: More than three times a week    Gets together: Three times a week    Attends religious service: 1 to 4 times per year    Active member of club or organization: No    Attends meetings of clubs or organizations: Never    Relationship status: Widowed  . Intimate partner violence    Fear of current or ex partner: No    Emotionally abused: No    Physically abused: No    Forced sexual activity: No  Other Topics Concern  . Not on file  Social History Narrative   Lives with son alone     FAMILY HISTORY:  Family History  Problem Relation Age of Onset  . Hypertension Mother   . Kidney failure Brother        on dialysis  . Diabetes Sister   . Diabetes Sister    . Hypertension Father   . Kidney failure Sister   . Kidney Stones Brother   . Breast cancer Son 67  . Gout Son 72  . Gout Nephew 57  . Liver disease Neg Hx   . Colon cancer Neg Hx     CURRENT MEDICATIONS:  Outpatient Encounter Medications as of 02/24/2019  Medication Sig  . CISPLATIN IV Inject into the vein. Days 1, 8 every 21 days  . clonazePAM (KLONOPIN) 0.5 MG tablet Take 1 tablet (0.5 mg total) by mouth at bedtime.  . cycloSPORINE (RESTASIS) 0.05 % ophthalmic emulsion Place 1 drop into both eyes 2 (two) times daily.  Marland Kitchen GEMCITABINE HCL IV Inject into the vein. Days 1, 8 every 21 days  . metoprolol tartrate (LOPRESSOR) 25 MG tablet Take one tablet once daily for blood pressure  . MYRBETRIQ 50 MG TB24 tablet TAKE 1 TABLET DAILY  . pantoprazole (PROTONIX) 20 MG tablet TAKE 1 TABLET DAILY  . acetaminophen (TYLENOL) 500 MG tablet Take 500 mg by mouth every 6 (six) hours as needed for mild pain or moderate pain.   Marland Kitchen amLODipine (NORVASC) 10 MG tablet TAKE 1 TABLET DAILY  . clobetasol (TEMOVATE) 0.05 % external solution APP AA ON SCALP BID PRN NOT TO FACE GROIN OR UNDERARMS  . diphenhydrAMINE (BENADRYL) 50 MG tablet Take 1 tablet 1 hour prior to CT (Patient not taking: Reported on 02/16/2019)  . fluorometholone (FML) 0.1 % ophthalmic suspension INSTILL 1 DROP INTO EACH EYE THREE TIMES DAILY FOR 14 DAYS  . fluticasone (FLONASE) 50 MCG/ACT nasal spray Place 1 spray into both nostrils daily.  Marland Kitchen lidocaine (XYLOCAINE) 2 % solution RINSE WITH 5ML AS NEEDED TO RELIEVE MOUTH PAIN.  Marland Kitchen lidocaine-prilocaine (EMLA) cream Apply 1 application topically as needed. Apply to portacath site as needed (Patient not taking: Reported on 02/16/2019)  . [DISCONTINUED] prochlorperazine (COMPAZINE) 10 MG tablet Take 1 tablet (10 mg total) by mouth every 6 (six) hours as needed (Nausea or vomiting).   No facility-administered encounter medications on file as of 02/24/2019.     ALLERGIES:  Allergies  Allergen  Reactions  . Iohexol Hives and Shortness Of Breath    **  Patient can not have IV contrast, pt has breakthrough reaction even after 13 hour premeds** Do not give IV contrast PATIENT HAD TO BE TAKEN TO ED, C/O WHELPS, HIVES, DIFFICULTY BREATHING. PATIENT GIVEN IV CONTRAST AFTER 13 HOUR PRE MEDS ON 01/09/2017 WITH NO REACTION NOTED. On 10/19/17 patient had reaction despite premedication.    Marland Kitchen Aciphex [Rabeprazole Sodium] Other (See Comments)    unknown  . Amlodipine Besylate-Valsartan     Rash to high-dose (5/320)  . Esomeprazole Magnesium   . Omeprazole Other (See Comments)    Patient states "medication didn't work"  . Ciprofloxacin Rash  . Penicillins Swelling and Rash    Has patient had a PCN reaction causing immediate rash, facial/tongue/throat swelling, SOB or lightheadedness with hypotension: Yes Has patient had a PCN reaction causing severe rash involving mucus membranes or skin necrosis: Yes Has patient had a PCN reaction that required hospitalization: No Has patient had a PCN reaction occurring within the last 10 years: yes If all of the above answers are "NO", then may proceed with Cephalosporin use.   [name] [Benzonatate] Itching    Full head, face, neck, and chest itching.      PHYSICAL EXAM:  ECOG Performance status: 1  There were no vitals filed for this visit. There were no vitals filed for this visit.  Physical Exam Vitals signs reviewed.  Constitutional:      Appearance: Normal appearance.  Cardiovascular:     Rate and Rhythm: Normal rate and regular rhythm.     Heart sounds: Normal heart sounds.  Pulmonary:     Effort: Pulmonary effort is normal.     Breath sounds: Normal breath sounds.  Abdominal:     General: There is no distension.     Palpations: Abdomen is soft. There is no mass.  Musculoskeletal:        General: No swelling.  Skin:    General: Skin is warm.  Neurological:     General: No focal deficit present.     Mental Status: She is  alert and oriented to person, place, and time.  Psychiatric:        Mood and Affect: Mood normal.        Behavior: Behavior normal.      LABORATORY DATA:  I have reviewed the labs as listed.  CBC    Component Value Date/Time   WBC 3.5 (L) 02/24/2019 0750   RBC 3.14 (L) 02/24/2019 0750   HGB 9.5 (L) 02/24/2019 0750   HCT 29.3 (L) 02/24/2019 0750   PLT 75 (L) 02/24/2019 0750   MCV 93.3 02/24/2019 0750   MCH 30.3 02/24/2019 0750   MCHC 32.4 02/24/2019 0750   RDW 15.5 02/24/2019 0750   LYMPHSABS 1.1 02/24/2019 0750   MONOABS 0.3 02/24/2019 0750   EOSABS 0.1 02/24/2019 0750   BASOSABS 0.0 02/24/2019 0750   CMP Latest Ref Rng & Units 02/24/2019 02/16/2019 02/10/2019  Glucose 70 - 99 mg/dL 93 104(H) 112(H)  BUN 8 - 23 mg/dL 28(H) 31(H) 26(H)  Creatinine 0.44 - 1.00 mg/dL 1.28(H) 1.06(H) 1.21(H)  Sodium 135 - 145 mmol/L 138 137 141  Potassium 3.5 - 5.1 mmol/L 3.6 4.2 3.9  Chloride 98 - 111 mmol/L 106 103 105  CO2 22 - 32 mmol/L _0 Calcium 8.9 - 10.3 mg/dL 8.3(L) 8.3(L) 8.6(L)  Total Protein 6.5 - 8.1 g/dL 7.4 7.1 7.7  Total Bilirubin 0.3 - 1.2 mg/dL 0.3 0.5 0.8  Alkaline Phos 38 - 126 U/L 146(H)  127(H) 136(H)  AST 15 - 41 U/L _0 ALT 0 - 44 U/L _1 DIAGNOSTIC IMAGING:  I have independently reviewed the scans and discussed with the patient.   I have reviewed Venita Lick LPN's note and agree with the documentation.  I personally performed a face-to-face visit, made revisions and my assessment and plan is as follows.    ASSESSMENT & PLAN:   Pancreatic cancer metastasized to liver ([place]) 1.  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 - Gemcitabine and Abraxane from normal 2015 through August 2018 with progression. -Gemcitabine and Tarceva from 01/28/2017 through 10/07/2017 with progression. -21 cycles of infusional 5-FU and Onivyde from  10/21/2017-09/08/2018. -7 cycles of FOLFOX from 09/22/2018-12/29/2018. -CT CAP on 01/10/2019 showed increase in size of dominant hepatic lesion near the dome measuring 4.7 cm, previously 2.6 cm.  Other lesions in the liver are stable.  No new lesions seen.  No lesions in the lungs. - I discussed about available clinical trial at [facility] for K-ras G 12 V mutations.  She does not want to travel that far. 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  MSI high was not detected. - Cycle 1 of gemcitabine (750 mg/M square) and cisplatin (20 mg/M square) on 02/10/2019. -She could not receive chemotherapy on date as she was feeling very weak. -Today she feels better.  She will proceed with her treatment today. -She will come back in 2 weeks for follow-up.  I plan to change her treatment to day 1 and day 15 every 28 days.  2.  Weight loss: -Her weight has been stable in the last 2 visits.  3.  Genetic testing: -Germline mutation testing was negative on 12/24/2018.  Total time spent is 25 minutes with more than 50% of the time spent face-to-face discussing new treatment plan, counseling and coordination of care.    Orders placed this encounter:  Orders Placed This Encounter  Procedures  . Urinalysis, Routine w reflex microscopic      Derek Jack, MD [place] [phone]

## 2019-02-24 NOTE — Patient Instructions (Signed)
[facility] Discharge Instructions for Patients Receiving Chemotherapy  Today you received the following chemotherapy agents   To help prevent nausea and vomiting after your treatment, we encourage you to take your nausea medication   If you develop nausea and vomiting that is not controlled by your nausea medication, call the clinic.   BELOW ARE SYMPTOMS THAT SHOULD BE REPORTED IMMEDIATELY:  *FEVER GREATER THAN 100.5 F  *CHILLS WITH OR WITHOUT FEVER  NAUSEA AND VOMITING THAT IS NOT CONTROLLED WITH YOUR NAUSEA MEDICATION  *UNUSUAL SHORTNESS OF BREATH  *UNUSUAL BRUISING OR BLEEDING  TENDERNESS IN MOUTH AND THROAT WITH OR WITHOUT PRESENCE OF ULCERS  *URINARY PROBLEMS  *BOWEL PROBLEMS  UNUSUAL RASH Items with * indicate a potential emergency and should be followed up as soon as possible.  Feel free to call the clinic should you have any questions or concerns. The clinic phone number is [phone].  Please show the CHEMO ALERT CARD at check-in to the Emergency Department and triage nurse.   

## 2019-02-24 NOTE — Progress Notes (Signed)
02/24/19  Confirmed ok to proceed with:  Platelets 75K and scr 1.28 for today's doses.  T.O. Dr Rhys Martini, PharmD

## 2019-02-25 ENCOUNTER — Other Ambulatory Visit: Payer: Self-pay

## 2019-02-25 ENCOUNTER — Encounter (HOSPITAL_COMMUNITY): Payer: Self-pay

## 2019-02-25 ENCOUNTER — Inpatient Hospital Stay (HOSPITAL_COMMUNITY): Payer: Medicare HMO

## 2019-02-25 VITALS — BP 172/68 | HR 60 | Temp 96.9°F | Resp 18

## 2019-02-25 DIAGNOSIS — T451X5A Adverse effect of antineoplastic and immunosuppressive drugs, initial encounter: Secondary | ICD-10-CM

## 2019-02-25 DIAGNOSIS — D6481 Anemia due to antineoplastic chemotherapy: Secondary | ICD-10-CM

## 2019-02-25 DIAGNOSIS — C787 Secondary malignant neoplasm of liver and intrahepatic bile duct: Secondary | ICD-10-CM

## 2019-02-25 DIAGNOSIS — C259 Malignant neoplasm of pancreas, unspecified: Secondary | ICD-10-CM

## 2019-02-25 DIAGNOSIS — Z5111 Encounter for antineoplastic chemotherapy: Secondary | ICD-10-CM | POA: Diagnosis not present

## 2019-02-25 DIAGNOSIS — C252 Malignant neoplasm of tail of pancreas: Secondary | ICD-10-CM | POA: Diagnosis not present

## 2019-02-25 DIAGNOSIS — Z5189 Encounter for other specified aftercare: Secondary | ICD-10-CM | POA: Diagnosis not present

## 2019-02-25 MED ORDER — SODIUM CHLORIDE 0.9% FLUSH
10.0000 mL | Freq: Once | INTRAVENOUS | Status: AC | PRN
  Administered 2019-02-25: 10 mL

## 2019-02-25 MED ORDER — HEPARIN SOD (PORK) LOCK FLUSH 100 UNIT/ML IV SOLN
500.0000 [IU] | Freq: Once | INTRAVENOUS | Status: AC | PRN
  Administered 2019-02-25: 12:00:00 500 [IU]

## 2019-02-25 MED ORDER — SODIUM CHLORIDE 0.9 % IV SOLN
Freq: Once | INTRAVENOUS | Status: AC
  Administered 2019-02-25: 10:00:00 via INTRAVENOUS
  Filled 2019-02-25: qty 1000

## 2019-02-25 MED ORDER — PEGFILGRASTIM-JMDB 6 MG/0.6ML ~~LOC~~ SOSY
6.0000 mg | PREFILLED_SYRINGE | Freq: Once | SUBCUTANEOUS | Status: AC
  Administered 2019-02-25: 6 mg via SUBCUTANEOUS
  Filled 2019-02-25: qty 0.6

## 2019-02-25 NOTE — Progress Notes (Signed)
Patient tolerated hydration and injection with no complaints voiced.  Port site clean and dry with good blood return noted before and after hydration.  No bruising or swelling noted with port.  Band aid applied.  VSS with discharge and left ambulatory with no s/s of distress noted.   

## 2019-02-28 ENCOUNTER — Telehealth: Payer: Self-pay

## 2019-02-28 NOTE — Telephone Encounter (Signed)
VM left for patient with call back information.

## 2019-02-28 NOTE — Telephone Encounter (Signed)
Phone call placed to patient to introduce Palliative Care and to offer to schedule visit with NP. Phone rang with no voicemail.

## 2019-03-07 ENCOUNTER — Telehealth: Payer: Self-pay

## 2019-03-07 NOTE — Telephone Encounter (Signed)
Phone call placed to patient to offer to schedule visit with Palliative Care. Patient shared that her son is back in town and will call back to schedule visit. Call back information provided.

## 2019-03-10 ENCOUNTER — Inpatient Hospital Stay (HOSPITAL_COMMUNITY): Payer: Medicare HMO

## 2019-03-10 ENCOUNTER — Inpatient Hospital Stay (HOSPITAL_BASED_OUTPATIENT_CLINIC_OR_DEPARTMENT_OTHER): Payer: Medicare HMO | Admitting: Hematology

## 2019-03-10 ENCOUNTER — Encounter (HOSPITAL_COMMUNITY): Payer: Self-pay

## 2019-03-10 ENCOUNTER — Other Ambulatory Visit: Payer: Self-pay

## 2019-03-10 ENCOUNTER — Encounter (HOSPITAL_COMMUNITY): Payer: Self-pay | Admitting: Hematology

## 2019-03-10 VITALS — BP 154/52 | HR 54 | Temp 96.8°F | Resp 18

## 2019-03-10 DIAGNOSIS — Z5189 Encounter for other specified aftercare: Secondary | ICD-10-CM | POA: Diagnosis not present

## 2019-03-10 DIAGNOSIS — C787 Secondary malignant neoplasm of liver and intrahepatic bile duct: Secondary | ICD-10-CM | POA: Diagnosis not present

## 2019-03-10 DIAGNOSIS — C259 Malignant neoplasm of pancreas, unspecified: Secondary | ICD-10-CM | POA: Diagnosis not present

## 2019-03-10 DIAGNOSIS — Z5111 Encounter for antineoplastic chemotherapy: Secondary | ICD-10-CM | POA: Diagnosis not present

## 2019-03-10 DIAGNOSIS — C252 Malignant neoplasm of tail of pancreas: Secondary | ICD-10-CM | POA: Diagnosis not present

## 2019-03-10 LAB — CBC WITH DIFFERENTIAL/PLATELET
Abs Immature Granulocytes: 0.15 10*3/uL — ABNORMAL HIGH (ref 0.00–0.07)
Basophils Absolute: 0 10*3/uL (ref 0.0–0.1)
Basophils Relative: 0 %
Eosinophils Absolute: 0.1 10*3/uL (ref 0.0–0.5)
Eosinophils Relative: 0 %
HCT: 28.5 % — ABNORMAL LOW (ref 36.0–46.0)
Hemoglobin: 9.3 g/dL — ABNORMAL LOW (ref 12.0–15.0)
Immature Granulocytes: 1 %
Lymphocytes Relative: 9 %
Lymphs Abs: 1.2 10*3/uL (ref 0.7–4.0)
MCH: 30.6 pg (ref 26.0–34.0)
MCHC: 32.6 g/dL (ref 30.0–36.0)
MCV: 93.8 fL (ref 80.0–100.0)
Monocytes Absolute: 0.9 10*3/uL (ref 0.1–1.0)
Monocytes Relative: 7 %
Neutro Abs: 10.7 10*3/uL — ABNORMAL HIGH (ref 1.7–7.7)
Neutrophils Relative %: 83 %
Platelets: 152 10*3/uL (ref 150–400)
RBC: 3.04 MIL/uL — ABNORMAL LOW (ref 3.87–5.11)
RDW: 16.7 % — ABNORMAL HIGH (ref 11.5–15.5)
WBC: 13 10*3/uL — ABNORMAL HIGH (ref 4.0–10.5)
nRBC: 0 % (ref 0.0–0.2)

## 2019-03-10 LAB — COMPREHENSIVE METABOLIC PANEL
ALT: 8 U/L (ref 0–44)
AST: 13 U/L — ABNORMAL LOW (ref 15–41)
Albumin: 3.4 g/dL — ABNORMAL LOW (ref 3.5–5.0)
Alkaline Phosphatase: 160 U/L — ABNORMAL HIGH (ref 38–126)
Anion gap: 8 (ref 5–15)
BUN: 19 mg/dL (ref 8–23)
CO2: 25 mmol/L (ref 22–32)
Calcium: 8.3 mg/dL — ABNORMAL LOW (ref 8.9–10.3)
Chloride: 107 mmol/L (ref 98–111)
Creatinine, Ser: 1.2 mg/dL — ABNORMAL HIGH (ref 0.44–1.00)
GFR calc Af Amer: 47 mL/min — ABNORMAL LOW (ref >60)
GFR calc non Af Amer: 40 mL/min — ABNORMAL LOW (ref >60)
Glucose, Bld: 113 mg/dL — ABNORMAL HIGH (ref 70–99)
Potassium: 3.9 mmol/L (ref 3.5–5.1)
Sodium: 140 mmol/L (ref 135–145)
Total Bilirubin: 0.2 mg/dL — ABNORMAL LOW (ref 0.3–1.2)
Total Protein: 7 g/dL (ref 6.5–8.1)

## 2019-03-10 LAB — MAGNESIUM: Magnesium: 1.9 mg/dL (ref 1.7–2.4)

## 2019-03-10 LAB — LACTATE DEHYDROGENASE: LDH: 145 U/L (ref 98–192)

## 2019-03-10 MED ORDER — SODIUM CHLORIDE 0.9 % IV SOLN
750.0000 mg/m2 | Freq: Once | INTRAVENOUS | Status: AC
  Administered 2019-03-10: 1140 mg via INTRAVENOUS
  Filled 2019-03-10: qty 5.26

## 2019-03-10 MED ORDER — PALONOSETRON HCL INJECTION 0.25 MG/5ML
0.2500 mg | Freq: Once | INTRAVENOUS | Status: AC
  Administered 2019-03-10: 0.25 mg via INTRAVENOUS
  Filled 2019-03-10: qty 5

## 2019-03-10 MED ORDER — POTASSIUM CHLORIDE 2 MEQ/ML IV SOLN
Freq: Once | INTRAVENOUS | Status: AC
  Administered 2019-03-10: 09:00:00 via INTRAVENOUS
  Filled 2019-03-10: qty 10

## 2019-03-10 MED ORDER — HEPARIN SOD (PORK) LOCK FLUSH 100 UNIT/ML IV SOLN
500.0000 [IU] | Freq: Once | INTRAVENOUS | Status: AC | PRN
  Administered 2019-03-10: 500 [IU]

## 2019-03-10 MED ORDER — SODIUM CHLORIDE 0.9 % IV SOLN
Freq: Once | INTRAVENOUS | Status: AC
  Administered 2019-03-10: 09:00:00 via INTRAVENOUS

## 2019-03-10 MED ORDER — SODIUM CHLORIDE 0.9 % IV SOLN
20.0000 mg/m2 | Freq: Once | INTRAVENOUS | Status: AC
  Administered 2019-03-10: 31 mg via INTRAVENOUS
  Filled 2019-03-10: qty 31

## 2019-03-10 MED ORDER — SODIUM CHLORIDE 0.9% FLUSH
10.0000 mL | INTRAVENOUS | Status: DC | PRN
  Administered 2019-03-10: 10 mL
  Filled 2019-03-10: qty 10

## 2019-03-10 MED ORDER — SODIUM CHLORIDE 0.9 % IV SOLN
Freq: Once | INTRAVENOUS | Status: AC
  Administered 2019-03-10: 11:00:00 via INTRAVENOUS
  Filled 2019-03-10: qty 5

## 2019-03-10 NOTE — Progress Notes (Signed)
[place], [place] [address]   CLINIC:  Medical Oncology/Hematology  PCP:  Fayrene Helper, MD [address] [place] [address] [phone]   REASON FOR VISIT:  Follow-up for metastatic pancreatic cancer to the liver   BRIEF ONCOLOGIC HISTORY:  Oncology History  Pancreatic cancer metastasized to liver ([place])  03/10/2014 Initial Diagnosis   Pancreatic cancer metastasized to liver   03/22/2014 - 03/05/2016 Chemotherapy   Abraxane/Gemzar days 1, 8, every 28 days.  Day 15 was cancelled due to leukopenia and thrombocytopenia on day 15 cycle 1.   05/24/2014 Treatment Plan Change   Day 8 of cycle 3 is held with ANC of 1.1   06/05/2014 Imaging   CT C/A/P . Interval decrease in size of the pancreatic tail mass.Improved hepatic metastatic disease. No new lesions. No CT findings for metastatic disease involving the chest.   08/09/2014 Tumor Marker   CA 19-9= 33 (WNL)   10/26/2014 Imaging   MRI- Continued interval decrease in size of the hepatic metastatic lesions and no new lesions are identified. Continued decrease in size of the pancreatic tail lesion.   01/03/2015 Tumor Marker   Results for [name], [name] (MRN [id number]) as of 01/18/2015 12:52  01/03/2015 10:00 CA 19-9: 14    01/17/2015 Imaging   MRI- Response to therapy of hepatic metastasis.  Similar size of a pancreatic tail lesion.   03/20/2015 Imaging   MRI-L spine- Severe disc space narrowing at L2-L3, with endplate reactive changes. Large disc extrusion into the ventral epidural space,central to the RIGHT with a cephalad migrated free fragment. Additional Large disc extrusion into the retroperitone...   03/22/2015 Imaging   CT pelvis- No evidence for metastatic disease within the pelvis.   07/24/2015 Imaging   MRI abd- Continued response to therapy, with no residual detectable liver metastases. No new sites of metastatic disease in the abdomen.    08/15/2015 Code Status      She confirms desire for DNR status.   12/21/2015 Imaging   MRI liver- Severe image degradation due to motion artifact reducing diagnostic sensitivity and specificity. Reduce conspicuity of the pancreatic tail lesion suggesting further improvement. The original liver lesions have resolved.   03/05/2016 Treatment Plan Change   Chemotherapy holiday/Break after 2 years worth of treatment   04/07/2016 Imaging   CT chest- When compared to recent chest CT, new minimally displaced anterior left sixth rib fracture. Slight increase in subcarinal adenopathy   04/28/2016 Imaging   Bone density- BMD as determined from Femur Neck Right is 0.705 g/cm2 with a T-Score of -2.4. This patient is considered osteopenic according to [facility]) criteria. Compared with the prior study on 02/23/2013, the BMD of the lumbar spine/rt. femoral neck show a statistically significant decrease.    05/23/2016 Imaging   MRI abd- 1. Exam is significantly degraded by patient respiratory motion. Consider follow-up exams with CT abdomen with without contrast per pancreatic protocol. 2. Fullness in tail of pancreas is more prominent and potentially increased in size. Recommend close attention on follow-up. (Consider CT as above) 3. No explanation for back pain.   09/11/2016 Imaging   MRI pancreas- Interval progression of pancreatic tail lesion. New differential perfusion right liver with areas of heterogeneity. Underlying metastatic disease in this region not excluded.   09/11/2016 Progression   MRi in conjunction with rising CA 19-9 are indicative of relapse of disease.   01/09/2017 Progression   CT C/A/P: 2.1 x  2.9 cm lesion in the pancreatic tail and abutting the splenic hilum, corresponding to known primary pancreatic neoplasm, mildly increased.  Scattered small hypoenhancing lesions in the liver measuring up to 10 mm, approximately 8-10 in number, suspicious for hepatic metastases.  No  findings specific for metastatic disease in the chest.  Additional ancillary findings as above.    01/27/2017 - 10/20/2017 Chemotherapy   The patient had gemcitabine (GEMZAR) 1,292 mg in sodium chloride 0.9 % 250 mL chemo infusion, 800 mg/m2 = 1,292 mg, Intravenous,  Once, 9 of 12 cycles Administration: 1,292 mg (01/28/2017), 1,292 mg (02/11/2017), 1,292 mg (02/25/2017), 1,292 mg (03/11/2017), 1,292 mg (03/25/2017), 1,292 mg (04/08/2017), 1,292 mg (04/22/2017), 1,292 mg (04/29/2017), 1,292 mg (05/20/2017), 1,292 mg (06/03/2017), 1,292 mg (06/24/2017), 1,292 mg (07/08/2017), 1,292 mg (07/29/2017), 1,292 mg (08/12/2017), 1,292 mg (08/26/2017), 1,292 mg (09/09/2017), 1,292 mg (09/23/2017), 1,292 mg (10/07/2017)  for chemotherapy treatment.    10/21/2017 - 09/22/2018 Chemotherapy   The patient had leucovorin 700 mg in dextrose 5 % 250 mL infusion, 644 mg, Intravenous,  Once, 22 of 22 cycles Administration: 700 mg (10/21/2017), 700 mg (11/04/2017), 700 mg (11/18/2017), 700 mg (12/02/2017), 700 mg (12/16/2017), 700 mg (12/30/2017), 700 mg (01/13/2018), 700 mg (01/27/2018), 700 mg (02/10/2018), 700 mg (02/24/2018), 700 mg (03/10/2018), 700 mg (03/24/2018), 700 mg (04/28/2018), 700 mg (05/17/2018), 700 mg (05/31/2018), 700 mg (06/16/2018), 700 mg (06/30/2018), 700 mg (07/21/2018), 700 mg (08/04/2018), 700 mg (08/25/2018), 700 mg (09/08/2018) ondansetron (ZOFRAN) 8 mg in sodium chloride 0.9 % 50 mL IVPB, , Intravenous,  Once, 21 of 21 cycles Administration:  (11/04/2017),  (01/27/2018),  (02/10/2018),  (02/24/2018),  (03/10/2018),  (03/24/2018),  (04/28/2018),  (05/17/2018),  (05/31/2018),  (06/16/2018),  (06/30/2018),  (07/21/2018),  (08/04/2018),  (08/25/2018),  (09/08/2018) fluorouracil (ADRUCIL) 3,850 mg in sodium chloride 0.9 % 73 mL chemo infusion, 2,400 mg/m2 = 3,850 mg, Intravenous, 1 Day/Dose, 22 of 22 cycles Dose modification: 1,920 mg/m2 (original dose 2,400 mg/m2, Cycle 2, Reason: Provider Judgment, Comment: mucisitis) Administration: 3,850 mg  (10/21/2017), 3,100 mg (11/04/2017), 3,100 mg (11/18/2017), 3,100 mg (12/02/2017), 3,100 mg (12/16/2017), 3,100 mg (12/30/2017), 3,100 mg (01/13/2018), 3,100 mg (01/27/2018), 3,100 mg (02/10/2018), 3,100 mg (02/24/2018), 3,100 mg (03/10/2018), 3,100 mg (03/24/2018), 3,100 mg (04/28/2018), 3,100 mg (05/17/2018), 3,100 mg (05/31/2018), 3,100 mg (06/16/2018), 3,100 mg (06/30/2018), 3,100 mg (07/21/2018), 3,100 mg (08/04/2018), 3,100 mg (08/25/2018), 3,100 mg (09/08/2018) irinotecan LIPOSOME (ONIVYDE) 81.7 mg in sodium chloride 0.9 % 500 mL chemo infusion, 50 mg/m2 = 81.7 mg (100 % of original dose 50 mg/m2), Intravenous, Once, 22 of 22 cycles Dose modification: 50 mg/m2 (original dose 50 mg/m2, Cycle 1, Reason: Provider Judgment), 50 mg/m2 (original dose 50 mg/m2, Cycle 2, Reason: Provider Judgment) Administration: 81.7 mg (10/21/2017), 81.7 mg (11/04/2017), 81.7 mg (11/18/2017), 81.7 mg (12/02/2017), 81.7 mg (12/16/2017), 81.7 mg (12/30/2017), 81.7 mg (01/13/2018), 81.7 mg (01/27/2018), 81.7 mg (02/10/2018), 81.7 mg (02/24/2018), 81.7 mg (03/10/2018), 81.7 mg (03/24/2018), 81.7 mg (04/28/2018), 81.7 mg (05/17/2018), 81.7 mg (05/31/2018), 81.7 mg (06/16/2018), 81.7 mg (06/30/2018), 81.7 mg (07/21/2018), 81.7 mg (08/04/2018), 81.7 mg (08/25/2018), 81.7 mg (09/08/2018)  for chemotherapy treatment.    09/22/2018 - 01/11/2019 Chemotherapy   The patient had palonosetron (ALOXI) injection 0.25 mg, 0.25 mg, Intravenous,  Once, 7 of 8 cycles Administration: 0.25 mg (09/22/2018), 0.25 mg (10/11/2018), 0.25 mg (10/25/2018), 0.25 mg (12/01/2018), 0.25 mg (12/15/2018), 0.25 mg (11/17/2018), 0.25 mg (12/29/2018) pegfilgrastim (NEULASTA) injection 6 mg, 6 mg, Subcutaneous, Once, 5 of 5 cycles Administration: 6 mg (10/13/2018), 6 mg (10/27/2018), 6 mg (12/03/2018),  6 mg (12/17/2018), 6 mg (11/19/2018) pegfilgrastim-cbqv (UDENYCA) injection 6 mg, 6 mg, Subcutaneous, Once, 1 of 2 cycles Administration: 6 mg (12/31/2018) leucovorin 628 mg in dextrose 5 % 250 mL infusion, 400 mg/m2 =  628 mg, Intravenous,  Once, 7 of 8 cycles Administration: 628 mg (09/22/2018), 628 mg (10/11/2018), 628 mg (10/25/2018), 628 mg (12/01/2018), 628 mg (12/15/2018), 628 mg (11/17/2018), 628 mg (12/29/2018) oxaliplatin (ELOXATIN) 100 mg in dextrose 5 % 500 mL chemo infusion, 65 mg/m2 = 100 mg (100 % of original dose 65 mg/m2), Intravenous,  Once, 7 of 8 cycles Dose modification: 65 mg/m2 (original dose 65 mg/m2, Cycle 1, Reason: Provider Judgment) Administration: 100 mg (09/22/2018), 100 mg (10/11/2018), 100 mg (10/25/2018), 100 mg (12/01/2018), 100 mg (12/15/2018), 100 mg (11/17/2018), 100 mg (12/29/2018) fluorouracil (ADRUCIL) chemo injection 500 mg, 320 mg/m2 = 500 mg (100 % of original dose 320 mg/m2), Intravenous,  Once, 7 of 8 cycles Dose modification: 320 mg/m2 (original dose 320 mg/m2, Cycle 1, Reason: Provider Judgment) Administration: 500 mg (09/22/2018), 500 mg (10/11/2018), 500 mg (10/25/2018), 500 mg (12/01/2018), 500 mg (12/15/2018), 500 mg (11/17/2018), 500 mg (12/29/2018) fluorouracil (ADRUCIL) 3,000 mg in sodium chloride 0.9 % 90 mL chemo infusion, 1,920 mg/m2 = 3,000 mg (100 % of original dose 1,920 mg/m2), Intravenous, 1 Day/Dose, 7 of 8 cycles Dose modification: 1,920 mg/m2 (original dose 1,920 mg/m2, Cycle 1, Reason: Provider Judgment) Administration: 3,000 mg (09/22/2018), 3,000 mg (10/11/2018), 3,000 mg (10/25/2018), 3,000 mg (12/01/2018), 3,000 mg (12/15/2018), 3,000 mg (11/17/2018), 3,000 mg (12/29/2018)  for chemotherapy treatment.    12/24/2018 Genetic Testing   Negative genetic testing on the common hereditary cancer panel.  The Common Hereditary Gene Panel offered by [facility] includes sequencing and/or deletion duplication testing of the following 48 genes: APC, ATM, AXIN2, BARD1, BMPR1A, BRCA1, BRCA2, BRIP1, CDH1, CDK4, CDKN2A (p14ARF), CDKN2A (p16INK4a), CHEK2, CTNNA1, DICER1, EPCAM (Deletion/duplication testing only), GREM1 (promoter region deletion/duplication testing only), KIT, MEN1, MLH1, MSH2, MSH3, MSH6,  MUTYH, NBN, NF1, NHTL1, PALB2, PDGFRA, PMS2, POLD1, POLE, PTEN, RAD50, RAD51C, RAD51D, RNF43, SDHB, SDHC, SDHD, SMAD4, SMARCA4. STK11, TP53, TSC1, TSC2, and VHL.  The following genes were evaluated for sequence changes only: SDHA and HOXB13 c.251G>A variant only. The report date is December 24, 2018.  DICER1 VUS identified.  This will not change medical decision making.   02/10/2019 -  Chemotherapy   The patient had palonosetron (ALOXI) injection 0.25 mg, 0.25 mg, Intravenous,  Once, 2 of 4 cycles Administration: 0.25 mg (02/10/2019), 0.25 mg (02/24/2019), 0.25 mg (03/10/2019) pegfilgrastim-jmdb (FULPHILA) injection 6 mg, 6 mg, Subcutaneous,  Once, 2 of 4 cycles Administration: 6 mg (02/25/2019) CISplatin (PLATINOL) 31 mg in sodium chloride 0.9 % 250 mL chemo infusion, 20 mg/m2 = 31 mg (80 % of original dose 25 mg/m2), Intravenous,  Once, 2 of 4 cycles Dose modification: 20 mg/m2 (80 % of original dose 25 mg/m2, Cycle 1, Reason: Other (see comments), Comment: renal dysfunction) Administration: 31 mg (02/10/2019), 31 mg (02/24/2019), 31 mg (03/10/2019) gemcitabine (GEMZAR) 1,140 mg in sodium chloride 0.9 % 250 mL chemo infusion, 750 mg/m2 = 1,140 mg (75 % of original dose 1,000 mg/m2), Intravenous,  Once, 2 of 4 cycles Dose modification: 750 mg/m2 (75 % of original dose 1,000 mg/m2, Cycle 1, Reason: Provider Judgment) Administration: 1,140 mg (02/10/2019), 1,140 mg (02/24/2019), 1,140 mg (03/10/2019) fosaprepitant (EMEND) 150 mg, dexamethasone (DECADRON) 12 mg in sodium chloride 0.9 % 145 mL IVPB, , Intravenous,  Once, 2 of 4 cycles Administration:  (02/10/2019),  (02/24/2019),  (03/10/2019)  for chemotherapy treatment.       CANCER STAGING: Cancer Staging Pancreatic cancer metastasized to liver [facility]) Staging form: Pancreas, AJCC 7th Edition - Clinical: Stage IV (T3, N1, M1) - Signed by Baird Cancer, PA-C on 03/16/2014 - Pathologic: No stage assigned - Unsigned    INTERVAL HISTORY:  Ms.  Kindig 85 y.o. female seen for follow-up of metastatic pancreatic cancer to the liver and toxicity assessment.  Last chemotherapy was on 02/24/2019.  She lost about 2 pounds in the last 2 weeks.  Appetite and energy levels are 75%.  She is not able to drink boost breeze secondary to skin rash.  She has occasional constipation alternating with diarrhea.  Mild fatigue is also stable.  Denies any fevers or chills.  Tingling and numbness in the extremities has been stable from prior chemotherapy.  REVIEW OF SYSTEMS:  Review of Systems  Gastrointestinal: Positive for constipation and diarrhea.  Neurological: Positive for numbness.  All other systems reviewed and are negative.    PAST MEDICAL/SURGICAL HISTORY:  Past Medical History:  Diagnosis Date   Anemia due to antineoplastic chemotherapy 09/12/2015   Started Aranesp 500 mcg on 09/12/2015   Anxiety    Chronic diarrhea    Depression    DNR (do not resuscitate) 08/16/2015   Erosive esophagitis    Family history of breast cancer in female    Family history of prostate cancer    GERD (gastroesophageal reflux disease)    Hx of adenomatous colonic polyps    tubular adenomas, last found in 2008   Hyperplastic colon polyp 03/19/10   tcs by Dr. Gala Romney   Hypertension 20 years    Kidney stone    hx/ crushed    Opioid contract exists 04/18/2015   With Dr. Moshe Cipro   Pancreatic cancer [facility]) 02/2014   Pancreatic cancer metastasized to liver ([place]) 03/10/2014   Schatzki's ring 08/26/10   Last dilated on EGD by Dr. Trevor Iha [facility], linear gastric erosions, BI hemigastrectomy   Past Surgical History:  Procedure Laterality Date   BREAST LUMPECTOMY Left    bunion removal     from both [place]  03/19/2010   DR Gala Romney,, normal TI, pancolonic diverticula, random colon bx neg., hyperplastic polyps removed   ESOPHAGOGASTRODUODENOSCOPY  11/22/2003   DR Gala Romney, erosive RE, [place] I    ESOPHAGOGASTRODUODENOSCOPY  08/26/10   Dr. Gala Romney- moderate severe ERE, Scahtzki ring s/p dilation, [place] I, linear gastric erosions, bx-gastric xanthelasma   LEFT SHOULDER SURGERY  2009   DR HARRISON   ORIF ANKLE FRACTURE Right 05/22/2013   Procedure: OPEN REDUCTION INTERNAL FIXATION (ORIF) RIGHT ANKLE FRACTURE;  Surgeon: Sanjuana Kava, MD;  Location: [facility];  Service: Orthopedics;  Laterality: Right;   stomach ulcer  50 years ago    had some of her stomach removed      SOCIAL HISTORY:  Social History   Socioeconomic History   Marital status: Widowed    Spouse name: Not on file   Number of children: 2   Years of education: 29   Highest education level: 12th grade  Occupational History   Occupation: retired from Estate manager/land agent: [employer] resource strain: Not hard at all   Food insecurity    Worry: Never true    Inability: Never true   Transportation needs    Medical: No    Non-medical: No  Tobacco Use   Smoking status:  Current Some Day Smoker    Packs/day: 0.50    Years: 60.00    Pack years: 30.00    Types: Cigarettes    Last attempt to quit: 09/09/2016    Years since quitting: 2.5   Smokeless tobacco: Never Used  Substance and Sexual Activity   Alcohol use: No   Drug use: No   Sexual activity: Not Currently  Lifestyle   Physical activity    Days per week: 3 days    Minutes per session: 30 min   Stress: Not at all  Relationships   Social connections    Talks on phone: More than three times a week    Gets together: Three times a week    Attends religious service: 1 to 4 times per year    Active member of club or organization: No    Attends meetings of clubs or organizations: Never    Relationship status: Widowed   Intimate partner violence    Fear of current or ex partner: No    Emotionally abused: No    Physically abused: No    Forced sexual activity: No  Other Topics Concern   Not on file  Social History  Narrative   Lives with son alone     FAMILY HISTORY:  Family History  Problem Relation Age of Onset   Hypertension Mother    Kidney failure Brother        on dialysis   Diabetes Sister    Diabetes Sister    Hypertension Father    Kidney failure Sister    Kidney Stones Brother    Breast cancer Son 63   Gout Son 44   Gout Nephew 57   Liver disease Neg Hx    Colon cancer Neg Hx     CURRENT MEDICATIONS:  Outpatient Encounter Medications as of 03/10/2019  Medication Sig   acetaminophen (TYLENOL) 500 MG tablet Take 500 mg by mouth every 6 (six) hours as needed for mild pain or moderate pain.    amLODipine (NORVASC) 10 MG tablet TAKE 1 TABLET DAILY   CISPLATIN IV Inject into the vein. Days 1, 8 every 21 days   clobetasol (TEMOVATE) 0.05 % external solution APP AA ON SCALP BID PRN NOT TO FACE GROIN OR UNDERARMS   clonazePAM (KLONOPIN) 0.5 MG tablet Take 1 tablet (0.5 mg total) by mouth at bedtime.   cycloSPORINE (RESTASIS) 0.05 % ophthalmic emulsion Place 1 drop into both eyes 2 (two) times daily.   diphenhydrAMINE (BENADRYL) 50 MG tablet Take 1 tablet 1 hour prior to CT   fluorometholone (FML) 0.1 % ophthalmic suspension INSTILL 1 DROP INTO EACH EYE THREE TIMES DAILY FOR 14 DAYS   fluticasone (FLONASE) 50 MCG/ACT nasal spray Place 1 spray into both nostrils daily.   GEMCITABINE HCL IV Inject into the vein. Days 1, 8 every 21 days   lidocaine (XYLOCAINE) 2 % solution RINSE WITH 5ML AS NEEDED TO RELIEVE MOUTH PAIN.   lidocaine-prilocaine (EMLA) cream Apply 1 application topically as needed. Apply to portacath site as needed   metoprolol tartrate (LOPRESSOR) 25 MG tablet Take one tablet once daily for blood pressure   MYRBETRIQ 50 MG TB24 tablet TAKE 1 TABLET DAILY   pantoprazole (PROTONIX) 20 MG tablet TAKE 1 TABLET DAILY   [DISCONTINUED] prochlorperazine (COMPAZINE) 10 MG tablet Take 1 tablet (10 mg total) by mouth every 6 (six) hours as needed (Nausea or  vomiting).   No facility-administered encounter medications on file as of 03/10/2019.  ALLERGIES:  Allergies  Allergen Reactions   Iohexol Hives and Shortness Of Breath    **Patient can not have IV contrast, pt has breakthrough reaction even after 13 hour premeds** Do not give IV contrast PATIENT HAD TO BE TAKEN TO ED, C/O WHELPS, HIVES, DIFFICULTY BREATHING. PATIENT GIVEN IV CONTRAST AFTER 13 HOUR PRE MEDS ON 01/09/2017 WITH NO REACTION NOTED. On 10/19/17 patient had reaction despite premedication.     Aciphex [Rabeprazole Sodium] Other (See Comments)    unknown   Amlodipine Besylate-Valsartan     Rash to high-dose (5/320)   Esomeprazole Magnesium    Omeprazole Other (See Comments)    Patient states "medication didn't work"   Ciprofloxacin Rash   Penicillins Swelling and Rash    Has patient had a PCN reaction causing immediate rash, facial/tongue/throat swelling, SOB or lightheadedness with hypotension: Yes Has patient had a PCN reaction causing severe rash involving mucus membranes or skin necrosis: Yes Has patient had a PCN reaction that required hospitalization: No Has patient had a PCN reaction occurring within the last 10 years: yes If all of the above answers are "NO", then may proceed with Cephalosporin use.    Tessalon Perles [Benzonatate] Itching    Full head, face, neck, and chest itching.      PHYSICAL EXAM:  ECOG Performance status: 1  Vitals:   03/10/19 0752  BP: (!) 164/49  Pulse: (!) 55  Resp: 18  Temp: (!) 97.3 F (36.3 C)  SpO2: 100%   Filed Weights   03/10/19 0752  Weight: 113 lb 12.8 oz (51.6 kg)    Physical Exam Vitals signs reviewed.  Constitutional:      Appearance: Normal appearance.  Cardiovascular:     Rate and Rhythm: Normal rate and regular rhythm.     Heart sounds: Normal heart sounds.  Pulmonary:     Effort: Pulmonary effort is normal.     Breath sounds: Normal breath sounds.  Abdominal:     General: There is no  distension.     Palpations: Abdomen is soft. There is no mass.  Musculoskeletal:        General: No swelling.  Skin:    General: Skin is warm.  Neurological:     General: No focal deficit present.     Mental Status: She is alert and oriented to person, place, and time.  Psychiatric:        Mood and Affect: Mood normal.        Behavior: Behavior normal.      LABORATORY DATA:  I have reviewed the labs as listed.  CBC    Component Value Date/Time   WBC 13.0 (H) 03/10/2019 0747   RBC 3.04 (L) 03/10/2019 0747   HGB 9.3 (L) 03/10/2019 0747   HCT 28.5 (L) 03/10/2019 0747   PLT 152 03/10/2019 0747   MCV 93.8 03/10/2019 0747   MCH 30.6 03/10/2019 0747   MCHC 32.6 03/10/2019 0747   RDW 16.7 (H) 03/10/2019 0747   LYMPHSABS 1.2 03/10/2019 0747   MONOABS 0.9 03/10/2019 0747   EOSABS 0.1 03/10/2019 0747   BASOSABS 0.0 03/10/2019 0747   CMP Latest Ref Rng & Units 03/10/2019 02/24/2019 02/16/2019  Glucose 70 - 99 mg/dL 113(H) 93 104(H)  BUN 8 - 23 mg/dL 19 28(H) 31(H)  Creatinine 0.44 - 1.00 mg/dL 1.20(H) 1.28(H) 1.06(H)  Sodium 135 - 145 mmol/L 140 138 137  Potassium 3.5 - 5.1 mmol/L 3.9 3.6 4.2  Chloride 98 - 111 mmol/L 107 106 103  CO2 22 - 32 mmol/L _0 Calcium 8.9 - 10.3 mg/dL 8.3(L) 8.3(L) 8.3(L)  Total Protein 6.5 - 8.1 g/dL 7.0 7.4 7.1  Total Bilirubin 0.3 - 1.2 mg/dL 0.2(L) 0.3 0.5  Alkaline Phos 38 - 126 U/L 160(H) 146(H) 127(H)  AST 15 - 41 U/L 13(L) 17 21  ALT 0 - 44 U/L _1 DIAGNOSTIC IMAGING:  I have independently reviewed the scans and discussed with the patient.   I have reviewed Venita Lick LPN's note and agree with the documentation.  I personally performed a face-to-face visit, made revisions and my assessment and plan is as follows.    ASSESSMENT & PLAN:   Pancreatic cancer metastasized to liver ([place]) 1.  Metastatic pancreatic cancer to the liver: - Foundation 1 testing shows MS-stable, TMB cannot be determined, K-ras G 12 V, TP  53 mutation. - Gemcitabine and Abraxane from normal 2015 through August 2018 with progression. -Gemcitabine and Tarceva from 01/28/2017 through 10/07/2017 with progression. -21 cycles of infusional 5-FU and Onivyde from 10/21/2017-09/08/2018. -7 cycles of FOLFOX from 09/22/2018-12/29/2018. -CT CAP on 01/10/2019 showed increase in size of dominant hepatic lesion near the dome measuring 4.7 cm, previously 2.6 cm.  Other lesions in the liver are stable.  No new lesions seen.  No lesions in the lungs. - I discussed about available clinical trial at [facility] for K-ras G 12 V mutations.  She does not want to travel that far. 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  MSI high was not detected. - Cycle 1 of gemcitabine (750 mg/M square) and cisplatin (20 mg/M square) on 02/10/2019.  Day 15 dose was on 02/24/2019. -We reviewed her labs.  She will proceed with cycle 2-day 1. -She will return in 2 weeks for day 15 chemotherapy.  2.  Weight loss: -She lost couple of pounds in the last 2 weeks.  She cannot drink boost breeze secondary to rash.  3.  Genetic testing: -Germline mutation testing was negative on 12/24/2018.  Total time spent is 25 minutes with more than 50% of the time spent face-to-face discussing new treatment plan, counseling and coordination of care.    Orders placed this encounter:  No orders of the defined types were placed in this encounter.     Derek Jack, MD [facility] [phone]

## 2019-03-10 NOTE — Progress Notes (Signed)
To treatment room for oncology follow up and treatment.  Reviewed side effects of medications with all questions asked and answered.  No s/s of distress.  Denied ringing in ears and having prn diarrhea.  Dr. Delton Coombes made aware for visit.   Patient seen by Dr. Delton Coombes with lab review and ok to treat verbal order.   Patient tolerated chemotherapy with no complaints voiced.  Port site clean and dry with no bruising or swelling noted at site.  Good blood return noted before and after administration of chemotherapy.  Band aid applied.  Patient left ambulatory with VSS and no s/s of distress noted.

## 2019-03-10 NOTE — Patient Instructions (Signed)
[facility] at [facility] Discharge Instructions  You were seen today by Dr. Katragadda. He went over your recent lab results. He will see you back in 2 weeks for labs, treatment and follow up.   Thank you for choosing [facility] at [facility] to provide your oncology and hematology care.  To afford each patient quality time with our provider, please arrive at least 15 minutes before your scheduled appointment time.   If you have a lab appointment with the Cancer Center please come in thru the  Main Entrance and check in at the main information desk  You need to re-schedule your appointment should you arrive 10 or more minutes late.  We strive to give you quality time with our providers, and arriving late affects you and other patients whose appointments are after yours.  Also, if you no show three or more times for appointments you may be dismissed from the clinic at the providers discretion.     Again, thank you for choosing [facility].  Our hope is that these requests will decrease the amount of time that you wait before being seen by our physicians.       _____________________________________________________________  Should you have questions after your visit to [facility], please contact our office at [phone] between the hours of 8:00 a.m. and 4:30 p.m.  Voicemails left after 4:00 p.m. will not be returned until the following business day.  For prescription refill requests, have your pharmacy contact our office and allow 72 hours.    Cancer Center Support Programs:   > Cancer Support Group  2nd Tuesday of the month 1pm-2pm, Journey Room    

## 2019-03-11 ENCOUNTER — Encounter (HOSPITAL_COMMUNITY): Payer: Self-pay | Admitting: Hematology

## 2019-03-11 ENCOUNTER — Inpatient Hospital Stay (HOSPITAL_COMMUNITY): Payer: Medicare HMO

## 2019-03-11 VITALS — BP 155/53 | HR 62 | Temp 96.9°F | Resp 18

## 2019-03-11 DIAGNOSIS — I739 Peripheral vascular disease, unspecified: Secondary | ICD-10-CM | POA: Diagnosis not present

## 2019-03-11 DIAGNOSIS — I10 Essential (primary) hypertension: Secondary | ICD-10-CM | POA: Diagnosis not present

## 2019-03-11 DIAGNOSIS — Z5111 Encounter for antineoplastic chemotherapy: Secondary | ICD-10-CM | POA: Diagnosis not present

## 2019-03-11 DIAGNOSIS — C252 Malignant neoplasm of tail of pancreas: Secondary | ICD-10-CM | POA: Diagnosis not present

## 2019-03-11 DIAGNOSIS — Z008 Encounter for other general examination: Secondary | ICD-10-CM | POA: Diagnosis not present

## 2019-03-11 DIAGNOSIS — C787 Secondary malignant neoplasm of liver and intrahepatic bile duct: Secondary | ICD-10-CM

## 2019-03-11 DIAGNOSIS — C259 Malignant neoplasm of pancreas, unspecified: Secondary | ICD-10-CM

## 2019-03-11 DIAGNOSIS — K219 Gastro-esophageal reflux disease without esophagitis: Secondary | ICD-10-CM | POA: Diagnosis not present

## 2019-03-11 DIAGNOSIS — H5462 Unqualified visual loss, left eye, normal vision right eye: Secondary | ICD-10-CM | POA: Diagnosis not present

## 2019-03-11 DIAGNOSIS — T451X5A Adverse effect of antineoplastic and immunosuppressive drugs, initial encounter: Secondary | ICD-10-CM

## 2019-03-11 DIAGNOSIS — D6481 Anemia due to antineoplastic chemotherapy: Secondary | ICD-10-CM

## 2019-03-11 DIAGNOSIS — G8929 Other chronic pain: Secondary | ICD-10-CM | POA: Diagnosis not present

## 2019-03-11 DIAGNOSIS — Z5189 Encounter for other specified aftercare: Secondary | ICD-10-CM | POA: Diagnosis not present

## 2019-03-11 DIAGNOSIS — R69 Illness, unspecified: Secondary | ICD-10-CM | POA: Diagnosis not present

## 2019-03-11 LAB — CANCER ANTIGEN 19-9: CA 19-9: 264 U/mL — ABNORMAL HIGH (ref 0–35)

## 2019-03-11 MED ORDER — LIDOCAINE-PRILOCAINE 2.5-2.5 % EX CREA: 1.0000 "application " | TOPICAL_CREAM | CUTANEOUS | 1 refills | Status: AC | PRN

## 2019-03-11 MED ORDER — HEPARIN SOD (PORK) LOCK FLUSH 100 UNIT/ML IV SOLN
500.0000 [IU] | Freq: Once | INTRAVENOUS | Status: AC | PRN
  Administered 2019-03-11: 500 [IU]

## 2019-03-11 MED ORDER — SODIUM CHLORIDE 0.9% FLUSH
10.0000 mL | Freq: Once | INTRAVENOUS | Status: AC | PRN
  Administered 2019-03-11: 10 mL

## 2019-03-11 MED ORDER — SODIUM CHLORIDE 0.9 % IV SOLN
Freq: Once | INTRAVENOUS | Status: AC
  Administered 2019-03-11: 09:00:00 via INTRAVENOUS
  Filled 2019-03-11: qty 1000

## 2019-03-11 NOTE — Patient Instructions (Signed)
[facility] Discharge Instructions for Patients Receiving Chemotherapy  Today you received the following chemotherapy agents   To help prevent nausea and vomiting after your treatment, we encourage you to take your nausea medication   If you develop nausea and vomiting that is not controlled by your nausea medication, call the clinic.   BELOW ARE SYMPTOMS THAT SHOULD BE REPORTED IMMEDIATELY:  *FEVER GREATER THAN 100.5 F  *CHILLS WITH OR WITHOUT FEVER  NAUSEA AND VOMITING THAT IS NOT CONTROLLED WITH YOUR NAUSEA MEDICATION  *UNUSUAL SHORTNESS OF BREATH  *UNUSUAL BRUISING OR BLEEDING  TENDERNESS IN MOUTH AND THROAT WITH OR WITHOUT PRESENCE OF ULCERS  *URINARY PROBLEMS  *BOWEL PROBLEMS  UNUSUAL RASH Items with * indicate a potential emergency and should be followed up as soon as possible.  Feel free to call the clinic should you have any questions or concerns. The clinic phone number is [phone].  Please show the CHEMO ALERT CARD at check-in to the Emergency Department and triage nurse.   

## 2019-03-11 NOTE — Assessment & Plan Note (Signed)
1.  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 - Gemcitabine and Abraxane from normal 2015 through August 2018 with progression. -Gemcitabine and Tarceva from 01/28/2017 through 10/07/2017 with progression. -21 cycles of infusional 5-FU and Onivyde from 10/21/2017-09/08/2018. -7 cycles of FOLFOX from 09/22/2018-12/29/2018. -CT CAP on 01/10/2019 showed increase in size of dominant hepatic lesion near the dome measuring 4.7 cm, previously 2.6 cm.  Other lesions in the liver are stable.  No new lesions seen.  No lesions in the lungs. - I discussed about available clinical trial at [facility] for K-ras G 12 V mutations.  She does not want to travel that far. 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  MSI high was not detected. - Cycle 1 of gemcitabine (750 mg/M square) and cisplatin (20 mg/M square) on 02/10/2019.  Day 15 dose was on 02/24/2019. -We reviewed her labs.  She will proceed with cycle 2-day 1. -She will return in 2 weeks for day 15 chemotherapy.  2.  Weight loss: -She lost couple of pounds in the last 2 weeks.  She cannot drink boost breeze secondary to rash.  3.  Genetic testing: -Germline mutation testing was negative on 12/24/2018.

## 2019-03-11 NOTE — Progress Notes (Signed)
Pt presents today for IV fluids only. Pt has no complaints of any changes since the last visit. EMLA cream ordered for pt's port.   Treatment given today per MD orders. Tolerated infusion without adverse affects. Vital signs stable. No complaints at this time. Discharged from clinic ambulatory. F/U with [facility] as scheduled.

## 2019-03-15 ENCOUNTER — Telehealth: Payer: Self-pay

## 2019-03-15 NOTE — Telephone Encounter (Signed)
Phone call placed to patient. Spoke with son, [name]. Discussed Palliative care and offered to schedule visit with NP. Son receptive to this. Scheduled for 03/22/2019. Verbal consent obtained. Address confirmed

## 2019-03-17 ENCOUNTER — Other Ambulatory Visit: Payer: Self-pay

## 2019-03-17 ENCOUNTER — Inpatient Hospital Stay (HOSPITAL_COMMUNITY): Payer: Medicare HMO | Attending: Hematology

## 2019-03-17 ENCOUNTER — Inpatient Hospital Stay (HOSPITAL_COMMUNITY): Payer: Medicare HMO

## 2019-03-17 VITALS — BP 182/63 | HR 61 | Temp 97.7°F | Resp 18 | Wt 112.4 lb

## 2019-03-17 DIAGNOSIS — Z5111 Encounter for antineoplastic chemotherapy: Secondary | ICD-10-CM | POA: Diagnosis not present

## 2019-03-17 DIAGNOSIS — C787 Secondary malignant neoplasm of liver and intrahepatic bile duct: Secondary | ICD-10-CM | POA: Diagnosis present

## 2019-03-17 DIAGNOSIS — C259 Malignant neoplasm of pancreas, unspecified: Secondary | ICD-10-CM

## 2019-03-17 DIAGNOSIS — C252 Malignant neoplasm of tail of pancreas: Secondary | ICD-10-CM | POA: Insufficient documentation

## 2019-03-17 LAB — COMPREHENSIVE METABOLIC PANEL
ALT: 13 U/L (ref 0–44)
AST: 16 U/L (ref 15–41)
Albumin: 3.5 g/dL (ref 3.5–5.0)
Alkaline Phosphatase: 142 U/L — ABNORMAL HIGH (ref 38–126)
Anion gap: 6 (ref 5–15)
BUN: 27 mg/dL — ABNORMAL HIGH (ref 8–23)
CO2: 23 mmol/L (ref 22–32)
Calcium: 8.3 mg/dL — ABNORMAL LOW (ref 8.9–10.3)
Chloride: 111 mmol/L (ref 98–111)
Creatinine, Ser: 1.24 mg/dL — ABNORMAL HIGH (ref 0.44–1.00)
GFR calc Af Amer: 45 mL/min — ABNORMAL LOW (ref >60)
GFR calc non Af Amer: 39 mL/min — ABNORMAL LOW (ref >60)
Glucose, Bld: 136 mg/dL — ABNORMAL HIGH (ref 70–99)
Potassium: 3.7 mmol/L (ref 3.5–5.1)
Sodium: 140 mmol/L (ref 135–145)
Total Bilirubin: 0.4 mg/dL (ref 0.3–1.2)
Total Protein: 7.1 g/dL (ref 6.5–8.1)

## 2019-03-17 LAB — MAGNESIUM: Magnesium: 1.8 mg/dL (ref 1.7–2.4)

## 2019-03-17 LAB — CBC WITH DIFFERENTIAL/PLATELET
Abs Immature Granulocytes: 0.04 10*3/uL (ref 0.00–0.07)
Basophils Absolute: 0 10*3/uL (ref 0.0–0.1)
Basophils Relative: 0 %
Eosinophils Absolute: 0 10*3/uL (ref 0.0–0.5)
Eosinophils Relative: 0 %
HCT: 27.7 % — ABNORMAL LOW (ref 36.0–46.0)
Hemoglobin: 8.8 g/dL — ABNORMAL LOW (ref 12.0–15.0)
Immature Granulocytes: 1 %
Lymphocytes Relative: 19 %
Lymphs Abs: 1.3 10*3/uL (ref 0.7–4.0)
MCH: 29.9 pg (ref 26.0–34.0)
MCHC: 31.8 g/dL (ref 30.0–36.0)
MCV: 94.2 fL (ref 80.0–100.0)
Monocytes Absolute: 0.3 10*3/uL (ref 0.1–1.0)
Monocytes Relative: 5 %
Neutro Abs: 5 10*3/uL (ref 1.7–7.7)
Neutrophils Relative %: 75 %
Platelets: 205 10*3/uL (ref 150–400)
RBC: 2.94 MIL/uL — ABNORMAL LOW (ref 3.87–5.11)
RDW: 16.4 % — ABNORMAL HIGH (ref 11.5–15.5)
WBC: 6.7 10*3/uL (ref 4.0–10.5)
nRBC: 0 % (ref 0.0–0.2)

## 2019-03-17 LAB — LACTATE DEHYDROGENASE: LDH: 123 U/L (ref 98–192)

## 2019-03-17 MED ORDER — SODIUM CHLORIDE 0.9 % IV SOLN: Freq: Once | INTRAVENOUS | Status: DC

## 2019-03-17 MED ORDER — HEPARIN SOD (PORK) LOCK FLUSH 100 UNIT/ML IV SOLN
500.0000 [IU] | Freq: Once | INTRAVENOUS | Status: AC | PRN
  Administered 2019-03-17: 500 [IU]

## 2019-03-17 MED ORDER — SODIUM CHLORIDE 0.9 % IV SOLN
20.0000 mg/m2 | Freq: Once | INTRAVENOUS | Status: AC
  Administered 2019-03-17: 31 mg via INTRAVENOUS
  Filled 2019-03-17: qty 31

## 2019-03-17 MED ORDER — SODIUM CHLORIDE 0.9 % IV SOLN
Freq: Once | INTRAVENOUS | Status: AC
  Administered 2019-03-17: 11:00:00 via INTRAVENOUS
  Filled 2019-03-17: qty 5

## 2019-03-17 MED ORDER — SODIUM CHLORIDE 0.9% FLUSH
10.0000 mL | INTRAVENOUS | Status: DC | PRN
  Administered 2019-03-17: 10 mL
  Filled 2019-03-17: qty 10

## 2019-03-17 MED ORDER — PALONOSETRON HCL INJECTION 0.25 MG/5ML
0.2500 mg | Freq: Once | INTRAVENOUS | Status: AC
  Administered 2019-03-17: 0.25 mg via INTRAVENOUS
  Filled 2019-03-17: qty 5

## 2019-03-17 MED ORDER — SODIUM CHLORIDE 0.9 % IV SOLN
Freq: Once | INTRAVENOUS | Status: AC
  Administered 2019-03-17: 08:00:00 via INTRAVENOUS

## 2019-03-17 MED ORDER — SODIUM CHLORIDE 0.9 % IV SOLN
750.0000 mg/m2 | Freq: Once | INTRAVENOUS | Status: AC
  Administered 2019-03-17: 1140 mg via INTRAVENOUS
  Filled 2019-03-17: qty 26.3

## 2019-03-17 MED ORDER — POTASSIUM CHLORIDE 2 MEQ/ML IV SOLN
Freq: Once | INTRAVENOUS | Status: AC
  Administered 2019-03-17: 09:00:00 via INTRAVENOUS
  Filled 2019-03-17: qty 10

## 2019-03-17 NOTE — Progress Notes (Signed)
To treatment room with family at side.  Patient denied ringing in ears and no changes in her neuropathy on the bottom of her feet.  Denied trips and falls.  Fair appetite.  No s/s of distress noted.    Labs checked and treatment plan released to pharmacy.   Patient tolerated chemotherapy with no complaints voiced.  Port site clean and dry with no bruising or swelling noted at site.  Good blood return noted before and after administration of chemotherapy.  Band aid applied.  Patient left ambulatory with VSS and no s/s of distress noted.

## 2019-03-18 ENCOUNTER — Inpatient Hospital Stay (HOSPITAL_COMMUNITY): Payer: Medicare HMO

## 2019-03-18 ENCOUNTER — Encounter (HOSPITAL_COMMUNITY): Payer: Self-pay

## 2019-03-18 VITALS — BP 165/58 | HR 68 | Temp 97.3°F | Resp 18

## 2019-03-18 DIAGNOSIS — C252 Malignant neoplasm of tail of pancreas: Secondary | ICD-10-CM | POA: Diagnosis not present

## 2019-03-18 DIAGNOSIS — Z5111 Encounter for antineoplastic chemotherapy: Secondary | ICD-10-CM | POA: Diagnosis not present

## 2019-03-18 DIAGNOSIS — C787 Secondary malignant neoplasm of liver and intrahepatic bile duct: Secondary | ICD-10-CM | POA: Diagnosis not present

## 2019-03-18 DIAGNOSIS — C259 Malignant neoplasm of pancreas, unspecified: Secondary | ICD-10-CM

## 2019-03-18 MED ORDER — PEGFILGRASTIM-JMDB 6 MG/0.6ML ~~LOC~~ SOSY
PREFILLED_SYRINGE | SUBCUTANEOUS | Status: AC
  Filled 2019-03-18: qty 0.6

## 2019-03-18 MED ORDER — PEGFILGRASTIM-JMDB 6 MG/0.6ML ~~LOC~~ SOSY
6.0000 mg | PREFILLED_SYRINGE | Freq: Once | SUBCUTANEOUS | Status: AC
  Administered 2019-03-18: 6 mg via SUBCUTANEOUS

## 2019-03-18 NOTE — Progress Notes (Signed)
Patient tolerated injection with no complaints voiced.  Site clean and dry with no bruising or swelling noted at site.  Band aid applied.  Vss with discharge and left ambulatory with no s/s of distress noted.  

## 2019-03-21 NOTE — Progress Notes (Addendum)
Nov 10th, 2020 [facility] Palliative Care Consult Note Telephone: [phone]  Fax: [phone]  PATIENT NAME: [name] DOB: February 02, 1929 MRN: [id number] [address], [place], [place]  [place] [address]  PRIMARY CARE PROVIDER:   Fayrene Helper, MD [address], [place],  [place] [address] [phone]  REFERRING PROVIDER:  Dr. Delton Coombes (referred 02/08/2019)  RESPONSIBLE PARTY:  Devonia Cajina [id number]  ASSESSMENT / RECOMMENDATIONS:  1. Advance Care Planning: A. Directives: DNR forms (#2) and  MOST form completed, uploaded into [facility] EMR (still need to do) and left in the home: DNR/DNI. Limited Scope of Medical Interventions. Yes to Antibiotics and IVFs. No to tube feedings. I advised patient and son to keep one form on the fridge (that's where EMS would look for it should they be summoned) and carry one copy with her should she leave the home. I e-mailed a companion patient educational sheet to Rodney's e-mail, and encouraged him to share this information with his brother.  B. Goals of Care: Patient was diagnosed 5 years ago; feels she has outlived her prognosis. She feels grateful for every day. She is hopeful that current Chemo regimen will keep cancer from aggressively spreading. Able to tolerate infusions every other week with just transient fatigue and constipation. Trial of weekly infusion left her too fatigued. She worries about a spot on her liver that is increasing in size. 2. Cognitive / Functional status:  A & O. She is independent in ADLs. No falls. Rare and transient dizziness. Uses cane. Able to walk from her home to see her sisters who live in adjacent homes. Nocturnal swelling of knees and ankles relived with elevation; associated localized pain. Over the last few days has across anterior abdomen sharp, constant pain; can last few min to "all day". Relieved with passing  gas or with bowel movements. She worries that this may indicate spread of cancer. Believes scheduled for repeat scan in 2-3 weeks. Tendency towards diarrhea managed with Imodium 2-3 tabs / day. On days she receives chemo she gets constipated, for which she'll take Senna. Decreased appetite. Her weight is 112 lbs, down 4 lbs over the last few weeks. At 5'3" her BMI is 19.8 kg/m2. She mentioned that over the last 5 years she has lost 40 lbs. Her pant size has decreased from a size 14 to a size 8. Has tried Ensure supplements but side effect of pruritus and abdominal cramping.  -Recommend Tylenol at bedtime for nocturnal knee/ankle pain -Remeron 15mg  qhs for appetite stimulation (script called in) -low impact exercises e-mailed to [facility]; I reviewed in brief. 3. Family Supports: Widowed with 2 children. Son [name] is here from [place]. Plans to stay for as long as is needed, with a week-long trip planned to go back to [facility] for a doctor's appointment. Rodney's son [name] (age 30) plans to come for Thanksgiving holiday. Oldest son [name] lives in [place].  Sister and brother live in a home next door on one side, and another 2 sisters live in separate homes on the other side. Many nieces and nephews live close by. Patient is upbeat and in good spirits. Not often discouraged or depressed but if feels so she calls her sisters for companionship. 4. Follow up Palliative Care Visit: Family wishes to call me on an as needed basis. I left my contact information I spent 60 minutes providing this consultation from 10am to 11am. More than 50% of the time  in this consultation was spent coordinating communication.   HISTORY OF PRESENT ILLNESS:  [name] is an 85 y.o. female with metastatic (liver) pancreatic (tail) cancer (dx 02/24/2019). Anemia, anxiety, chronic diarrhea, depression erosive esophagitis, GERD, HTN, Schatzki' ring (dilated EGD). Palliative Care was asked to help address goals of care. Referral  originally received for hospice, but patient wishes ongoing treatment for her cancer. CODE STATUS: DNR  PPS: 70%  HOSPICE ELIGIBILITY/DIAGNOSIS: TBD  PAST MEDICAL HISTORY:  Past Medical History:  Diagnosis Date  . Anemia due to antineoplastic chemotherapy 09/12/2015   Started Aranesp 500 mcg on 09/12/2015  . Anxiety   . Chronic diarrhea   . Depression   . DNR (do not resuscitate) 08/16/2015  . Erosive esophagitis   . Family history of breast cancer in female   . Family history of prostate cancer   . GERD (gastroesophageal reflux disease)   . Hx of adenomatous colonic polyps    tubular adenomas, last found in 2008  . Hyperplastic colon polyp 03/19/10   tcs by Dr. Gala Romney  . Hypertension 20 years   . Kidney stone    hx/ crushed   . Opioid contract exists 04/18/2015   With Dr. Moshe Cipro  . Pancreatic cancer ([place]) 02/2014  . Pancreatic cancer metastasized to liver ([place]) 03/10/2014  . Schatzki's ring 08/26/10   Last dilated on EGD by Dr. Trevor Iha [facility], linear gastric erosions, BI hemigastrectomy    SOCIAL HX:  Social History   Tobacco Use  . Smoking status: Current Some Day Smoker    Packs/day: 0.50    Years: 60.00    Pack years: 30.00    Types: Cigarettes    Last attempt to quit: 09/09/2016    Years since quitting: 2.5  . Smokeless tobacco: Never Used  Substance Use Topics  . Alcohol use: No    ALLERGIES:  Allergies  Allergen Reactions  . Iohexol Hives and Shortness Of Breath    **Patient can not have IV contrast, pt has breakthrough reaction even after 13 hour premeds** Do not give IV contrast PATIENT HAD TO BE TAKEN TO ED, C/O WHELPS, HIVES, DIFFICULTY BREATHING. PATIENT GIVEN IV CONTRAST AFTER 13 HOUR PRE MEDS ON 01/09/2017 WITH NO REACTION NOTED. On 10/19/17 patient had reaction despite premedication.    Marland Kitchen Aciphex [Rabeprazole Sodium] Other (See Comments)    unknown  . Amlodipine Besylate-Valsartan     Rash to high-dose (5/320)  . Esomeprazole Magnesium   . Omeprazole  Other (See Comments)    Patient states "medication didn't work"  . Ciprofloxacin Rash  . Penicillins Swelling and Rash    Has patient had a PCN reaction causing immediate rash, facial/tongue/throat swelling, SOB or lightheadedness with hypotension: Yes Has patient had a PCN reaction causing severe rash involving mucus membranes or skin necrosis: Yes Has patient had a PCN reaction that required hospitalization: No Has patient had a PCN reaction occurring within the last 10 years: yes If all of the above answers are "NO", then may proceed with Cephalosporin use.   [name] [Benzonatate] Itching    Full head, face, neck, and chest itching.      PERTINENT MEDICATIONS:  Outpatient Encounter Medications as of 03/22/2019  Medication Sig  . acetaminophen (TYLENOL) 500 MG tablet Take 500 mg by mouth every 6 (six) hours as needed for mild pain or moderate pain.   Marland Kitchen amLODipine (NORVASC) 10 MG tablet TAKE 1 TABLET DAILY  . clobetasol (TEMOVATE) 0.05 % external solution APP AA ON SCALP BID  PRN NOT TO FACE GROIN OR UNDERARMS  . clonazePAM (KLONOPIN) 0.5 MG tablet Take 1 tablet (0.5 mg total) by mouth at bedtime.  . cycloSPORINE (RESTASIS) 0.05 % ophthalmic emulsion Place 1 drop into both eyes 2 (two) times daily.  . diphenhydrAMINE (BENADRYL) 50 MG tablet Take 1 tablet 1 hour prior to CT  . fluticasone (FLONASE) 50 MCG/ACT nasal spray Place 1 spray into both nostrils daily.  Marland Kitchen lidocaine-prilocaine (EMLA) cream Apply 1 application topically as needed. Apply to portacath site as needed  . loperamide (IMODIUM A-D) 2 MG tablet Take 2 mg by mouth 4 (four) times daily as needed for diarrhea or loose stools.  . metoprolol tartrate (LOPRESSOR) 25 MG tablet Take one tablet once daily for blood pressure  . mirtazapine (REMERON) 15 MG tablet Take 15 mg by mouth at bedtime.  Marland Kitchen MYRBETRIQ 50 MG TB24 tablet TAKE 1 TABLET DAILY  . pantoprazole (PROTONIX) 20 MG tablet TAKE 1 TABLET DAILY  . senna-docusate  (SENOKOT-S) 8.6-50 MG tablet Take 1 tablet by mouth daily.  Marland Kitchen CISPLATIN IV Inject into the vein. Days 1, 8 every 21 days  . fluorometholone (FML) 0.1 % ophthalmic suspension INSTILL 1 DROP INTO EACH EYE THREE TIMES DAILY FOR 14 DAYS  . GEMCITABINE HCL IV Inject into the vein. Days 1, 8 every 21 days  . lidocaine (XYLOCAINE) 2 % solution RINSE WITH 5ML AS NEEDED TO RELIEVE MOUTH PAIN.  . [DISCONTINUED] prochlorperazine (COMPAZINE) 10 MG tablet Take 1 tablet (10 mg total) by mouth every 6 (six) hours as needed (Nausea or vomiting).   No facility-administered encounter medications on file as of 03/22/2019.     PHYSICAL EXAM:   General: NAD, frail appearing, thin. Alert and pleasantly conversant. Son [name] in attendance PE deferred to decrease COVID exposure. Extremities: mild pedal edema, no joint deformities Skin: no rashes exposed areas Neurological: Weakness but otherwise non-focal  Julianne Handler, NP

## 2019-03-22 ENCOUNTER — Other Ambulatory Visit: Payer: Self-pay

## 2019-03-22 ENCOUNTER — Other Ambulatory Visit: Payer: Medicare HMO | Admitting: Internal Medicine

## 2019-03-22 DIAGNOSIS — Z515 Encounter for palliative care: Secondary | ICD-10-CM | POA: Diagnosis not present

## 2019-03-22 DIAGNOSIS — R634 Abnormal weight loss: Secondary | ICD-10-CM | POA: Diagnosis not present

## 2019-03-23 ENCOUNTER — Encounter: Payer: Self-pay | Admitting: Internal Medicine

## 2019-03-31 ENCOUNTER — Inpatient Hospital Stay (HOSPITAL_COMMUNITY): Payer: Medicare HMO

## 2019-03-31 ENCOUNTER — Encounter (HOSPITAL_COMMUNITY): Payer: Self-pay | Admitting: Hematology

## 2019-03-31 ENCOUNTER — Other Ambulatory Visit: Payer: Self-pay

## 2019-03-31 ENCOUNTER — Encounter (HOSPITAL_COMMUNITY): Payer: Self-pay

## 2019-03-31 ENCOUNTER — Inpatient Hospital Stay (HOSPITAL_BASED_OUTPATIENT_CLINIC_OR_DEPARTMENT_OTHER): Payer: Medicare HMO | Admitting: Hematology

## 2019-03-31 VITALS — BP 155/46 | HR 56 | Temp 97.6°F | Resp 18

## 2019-03-31 DIAGNOSIS — C252 Malignant neoplasm of tail of pancreas: Secondary | ICD-10-CM | POA: Diagnosis not present

## 2019-03-31 DIAGNOSIS — C259 Malignant neoplasm of pancreas, unspecified: Secondary | ICD-10-CM

## 2019-03-31 DIAGNOSIS — C787 Secondary malignant neoplasm of liver and intrahepatic bile duct: Secondary | ICD-10-CM | POA: Diagnosis not present

## 2019-03-31 DIAGNOSIS — Z5111 Encounter for antineoplastic chemotherapy: Secondary | ICD-10-CM | POA: Diagnosis not present

## 2019-03-31 LAB — CBC WITH DIFFERENTIAL/PLATELET
Abs Immature Granulocytes: 0.15 10*3/uL — ABNORMAL HIGH (ref 0.00–0.07)
Basophils Absolute: 0 10*3/uL (ref 0.0–0.1)
Basophils Relative: 0 %
Eosinophils Absolute: 0.2 10*3/uL (ref 0.0–0.5)
Eosinophils Relative: 1 %
HCT: 27.5 % — ABNORMAL LOW (ref 36.0–46.0)
Hemoglobin: 8.8 g/dL — ABNORMAL LOW (ref 12.0–15.0)
Immature Granulocytes: 1 %
Lymphocytes Relative: 10 %
Lymphs Abs: 1.5 10*3/uL (ref 0.7–4.0)
MCH: 30.7 pg (ref 26.0–34.0)
MCHC: 32 g/dL (ref 30.0–36.0)
MCV: 95.8 fL (ref 80.0–100.0)
Monocytes Absolute: 1 10*3/uL (ref 0.1–1.0)
Monocytes Relative: 7 %
Neutro Abs: 12.7 10*3/uL — ABNORMAL HIGH (ref 1.7–7.7)
Neutrophils Relative %: 81 %
Platelets: 128 10*3/uL — ABNORMAL LOW (ref 150–400)
RBC: 2.87 MIL/uL — ABNORMAL LOW (ref 3.87–5.11)
RDW: 18.6 % — ABNORMAL HIGH (ref 11.5–15.5)
WBC: 15.5 10*3/uL — ABNORMAL HIGH (ref 4.0–10.5)
nRBC: 0 % (ref 0.0–0.2)

## 2019-03-31 LAB — COMPREHENSIVE METABOLIC PANEL
ALT: 9 U/L (ref 0–44)
AST: 15 U/L (ref 15–41)
Albumin: 3.6 g/dL (ref 3.5–5.0)
Alkaline Phosphatase: 192 U/L — ABNORMAL HIGH (ref 38–126)
Anion gap: 8 (ref 5–15)
BUN: 21 mg/dL (ref 8–23)
CO2: 24 mmol/L (ref 22–32)
Calcium: 8 mg/dL — ABNORMAL LOW (ref 8.9–10.3)
Chloride: 107 mmol/L (ref 98–111)
Creatinine, Ser: 1.32 mg/dL — ABNORMAL HIGH (ref 0.44–1.00)
GFR calc Af Amer: 42 mL/min — ABNORMAL LOW (ref >60)
GFR calc non Af Amer: 36 mL/min — ABNORMAL LOW (ref >60)
Glucose, Bld: 98 mg/dL (ref 70–99)
Potassium: 3.9 mmol/L (ref 3.5–5.1)
Sodium: 139 mmol/L (ref 135–145)
Total Bilirubin: 0.4 mg/dL (ref 0.3–1.2)
Total Protein: 7.2 g/dL (ref 6.5–8.1)

## 2019-03-31 LAB — LACTATE DEHYDROGENASE: LDH: 150 U/L (ref 98–192)

## 2019-03-31 LAB — MAGNESIUM: Magnesium: 1.8 mg/dL (ref 1.7–2.4)

## 2019-03-31 MED ORDER — HEPARIN SOD (PORK) LOCK FLUSH 100 UNIT/ML IV SOLN
500.0000 [IU] | Freq: Once | INTRAVENOUS | Status: AC | PRN
  Administered 2019-03-31: 500 [IU]

## 2019-03-31 MED ORDER — SODIUM CHLORIDE 0.9 % IV SOLN
Freq: Once | INTRAVENOUS | Status: AC
  Administered 2019-03-31: 09:00:00 via INTRAVENOUS

## 2019-03-31 MED ORDER — SODIUM CHLORIDE 0.9 % IV SOLN
750.0000 mg/m2 | Freq: Once | INTRAVENOUS | Status: AC
  Administered 2019-03-31: 11:00:00 1140 mg via INTRAVENOUS
  Filled 2019-03-31: qty 5.26

## 2019-03-31 MED ORDER — SODIUM CHLORIDE 0.9 % IV SOLN
20.0000 mg/m2 | Freq: Once | INTRAVENOUS | Status: AC
  Administered 2019-03-31: 31 mg via INTRAVENOUS
  Filled 2019-03-31: qty 31

## 2019-03-31 MED ORDER — SODIUM CHLORIDE 0.9% FLUSH
10.0000 mL | INTRAVENOUS | Status: DC | PRN
  Administered 2019-03-31: 10 mL
  Filled 2019-03-31: qty 10

## 2019-03-31 MED ORDER — SODIUM CHLORIDE 0.9 % IV SOLN
Freq: Once | INTRAVENOUS | Status: AC
  Administered 2019-03-31: 10:00:00 via INTRAVENOUS
  Filled 2019-03-31: qty 5

## 2019-03-31 MED ORDER — ACETAMINOPHEN 325 MG PO TABS
ORAL_TABLET | ORAL | Status: AC
  Filled 2019-03-31: qty 2

## 2019-03-31 MED ORDER — PALONOSETRON HCL INJECTION 0.25 MG/5ML
0.2500 mg | Freq: Once | INTRAVENOUS | Status: AC
  Administered 2019-03-31: 0.25 mg via INTRAVENOUS
  Filled 2019-03-31: qty 5

## 2019-03-31 MED ORDER — POTASSIUM CHLORIDE 2 MEQ/ML IV SOLN
Freq: Once | INTRAVENOUS | Status: AC
  Administered 2019-03-31: 09:00:00 via INTRAVENOUS
  Filled 2019-03-31: qty 10

## 2019-03-31 MED ORDER — ACETAMINOPHEN 325 MG PO TABS
650.0000 mg | ORAL_TABLET | Freq: Once | ORAL | Status: AC
  Administered 2019-03-31: 650 mg via ORAL

## 2019-03-31 NOTE — Patient Instructions (Signed)
[facility] at [facility] Discharge Instructions  You were seen today by Dr. Katragadda. He went over your recent lab results. He will see you back in 2 weeks for labs, treatment and follow up.   Thank you for choosing [facility] at [facility] to provide your oncology and hematology care.  To afford each patient quality time with our provider, please arrive at least 15 minutes before your scheduled appointment time.   If you have a lab appointment with the Cancer Center please come in thru the  Main Entrance and check in at the main information desk  You need to re-schedule your appointment should you arrive 10 or more minutes late.  We strive to give you quality time with our providers, and arriving late affects you and other patients whose appointments are after yours.  Also, if you no show three or more times for appointments you may be dismissed from the clinic at the providers discretion.     Again, thank you for choosing [facility].  Our hope is that these requests will decrease the amount of time that you wait before being seen by our physicians.       _____________________________________________________________  Should you have questions after your visit to  Cancer Center, please contact our office at [phone] between the hours of 8:00 a.m. and 4:30 p.m.  Voicemails left after 4:00 p.m. will not be returned until the following business day.  For prescription refill requests, have your pharmacy contact our office and allow 72 hours.    Cancer Center Support Programs:   > Cancer Support Group  2nd Tuesday of the month 1pm-2pm, Journey Room    

## 2019-03-31 NOTE — Patient Instructions (Signed)
[facility] at [facility] Discharge Instructions  Labs drawn from portacath today   Thank you for choosing [facility] at [facility] to provide your oncology and hematology care.  To afford each patient quality time with our provider, please arrive at least 15 minutes before your scheduled appointment time.   If you have a lab appointment with the Cancer Center please come in thru the Main Entrance and check in at the main information desk.  You need to re-schedule your appointment should you arrive 10 or more minutes late.  We strive to give you quality time with our providers, and arriving late affects you and other patients whose appointments are after yours.  Also, if you no show three or more times for appointments you may be dismissed from the clinic at the providers discretion.     Again, thank you for choosing [facility].  Our hope is that these requests will decrease the amount of time that you wait before being seen by our physicians.       _____________________________________________________________  Should you have questions after your visit to [facility], please contact our office at [phone] between the hours of 8:00 a.m. and 4:30 p.m.  Voicemails left after 4:00 p.m. will not be returned until the following business day.  For prescription refill requests, have your pharmacy contact our office and allow 72 hours.    Due to Covid, you will need to wear a mask upon entering the hospital. If you do not have a mask, a mask will be given to you at the Main Entrance upon arrival. For doctor visits, patients may have 1 support person with them. For treatment visits, patients can not have anyone with them due to social distancing guidelines and our immunocompromised population.     

## 2019-03-31 NOTE — Patient Instructions (Signed)
[facility] Discharge Instructions for Patients Receiving Chemotherapy   Beginning January 23rd 2017 lab work for the [facility] will be done in the  Main lab at [facility] on 1st floor. If you have a lab appointment with the [facility] please come in thru the  Main Entrance and check in at the main information desk   Today you received the following chemotherapy agents Cisplatin and Gemzar. Follow-up as scheduled. Call clinic for any questions or concerns  To help prevent nausea and vomiting after your treatment, we encourage you to take your nausea medication   If you develop nausea and vomiting, or diarrhea that is not controlled by your medication, call the clinic.  The clinic phone number is [phone]. Office hours are Monday-Friday 8:30am-5:00pm.  BELOW ARE SYMPTOMS THAT SHOULD BE REPORTED IMMEDIATELY:  *FEVER GREATER THAN 101.0 F  *CHILLS WITH OR WITHOUT FEVER  NAUSEA AND VOMITING THAT IS NOT CONTROLLED WITH YOUR NAUSEA MEDICATION  *UNUSUAL SHORTNESS OF BREATH  *UNUSUAL BRUISING OR BLEEDING  TENDERNESS IN MOUTH AND THROAT WITH OR WITHOUT PRESENCE OF ULCERS  *URINARY PROBLEMS  *BOWEL PROBLEMS  UNUSUAL RASH Items with * indicate a potential emergency and should be followed up as soon as possible. If you have an emergency after office hours please contact your primary care physician or go to the nearest emergency department.  Please call the clinic during office hours if you have any questions or concerns.   You may also contact the Patient Navigator at [phone] should you have any questions or need assistance in obtaining follow up care.      Resources For Cancer Patients and their Caregivers ? American Cancer Society: Can assist with transportation, wigs, general needs, runs Look Good Feel Better.        873-008-7548 ? Cancer Care: Provides financial assistance, online support groups, medication/co-pay assistance.   1-800-813-HOPE 616 432 2930) ? [facility] Assists [place] cancer patients and their families through emotional , educational and financial support.  (604) 743-0864 ? [place] DSS Where to apply for food stamps, Medicaid and utility assistance. (331)458-2026 ? [facility]: Transportation to medical appointments. 905-632-4977 ? Social Security Administration: May apply for disability if have a Stage IV cancer. [phone] ? [employer], Disability and Transit Services: Assists with nutrition, care and transit needs. 3107893742

## 2019-03-31 NOTE — Progress Notes (Signed)
[place], [place] [address]   CLINIC:  Medical Oncology/Hematology  PCP:  Fayrene Helper, MD [address] [place] [address] [phone]   REASON FOR VISIT:  Follow-up for metastatic pancreatic cancer to the liver   BRIEF ONCOLOGIC HISTORY:  Oncology History  Pancreatic cancer metastasized to liver ([place])  03/10/2014 Initial Diagnosis   Pancreatic cancer metastasized to liver   03/22/2014 - 03/05/2016 Chemotherapy   Abraxane/Gemzar days 1, 8, every 28 days.  Day 15 was cancelled due to leukopenia and thrombocytopenia on day 15 cycle 1.   05/24/2014 Treatment Plan Change   Day 8 of cycle 3 is held with ANC of 1.1   06/05/2014 Imaging   CT C/A/P . Interval decrease in size of the pancreatic tail mass.Improved hepatic metastatic disease. No new lesions. No CT findings for metastatic disease involving the chest.   08/09/2014 Tumor Marker   CA 19-9= 33 (WNL)   10/26/2014 Imaging   MRI- Continued interval decrease in size of the hepatic metastatic lesions and no new lesions are identified. Continued decrease in size of the pancreatic tail lesion.   01/03/2015 Tumor Marker   Results for [name], [name] (MRN [id number]) as of 01/18/2015 12:52  01/03/2015 10:00 CA 19-9: 14    01/17/2015 Imaging   MRI- Response to therapy of hepatic metastasis.  Similar size of a pancreatic tail lesion.   03/20/2015 Imaging   MRI-L spine- Severe disc space narrowing at L2-L3, with endplate reactive changes. Large disc extrusion into the ventral epidural space,central to the RIGHT with a cephalad migrated free fragment. Additional Large disc extrusion into the retroperitone...   03/22/2015 Imaging   CT pelvis- No evidence for metastatic disease within the pelvis.   07/24/2015 Imaging   MRI abd- Continued response to therapy, with no residual detectable liver metastases. No new sites of metastatic disease in the abdomen.    08/15/2015 Code Status      She confirms desire for DNR status.   12/21/2015 Imaging   MRI liver- Severe image degradation due to motion artifact reducing diagnostic sensitivity and specificity. Reduce conspicuity of the pancreatic tail lesion suggesting further improvement. The original liver lesions have resolved.   03/05/2016 Treatment Plan Change   Chemotherapy holiday/Break after 2 years worth of treatment   04/07/2016 Imaging   CT chest- When compared to recent chest CT, new minimally displaced anterior left sixth rib fracture. Slight increase in subcarinal adenopathy   04/28/2016 Imaging   Bone density- BMD as determined from Femur Neck Right is 0.705 g/cm2 with a T-Score of -2.4. This patient is considered osteopenic according to [facility]) criteria. Compared with the prior study on 02/23/2013, the BMD of the lumbar spine/rt. femoral neck show a statistically significant decrease.    05/23/2016 Imaging   MRI abd- 1. Exam is significantly degraded by patient respiratory motion. Consider follow-up exams with CT abdomen with without contrast per pancreatic protocol. 2. Fullness in tail of pancreas is more prominent and potentially increased in size. Recommend close attention on follow-up. (Consider CT as above) 3. No explanation for back pain.   09/11/2016 Imaging   MRI pancreas- Interval progression of pancreatic tail lesion. New differential perfusion right liver with areas of heterogeneity. Underlying metastatic disease in this region not excluded.   09/11/2016 Progression   MRi in conjunction with rising CA 19-9 are indicative of relapse of disease.   01/09/2017 Progression   CT C/A/P: 2.1 x  2.9 cm lesion in the pancreatic tail and abutting the splenic hilum, corresponding to known primary pancreatic neoplasm, mildly increased.  Scattered small hypoenhancing lesions in the liver measuring up to 10 mm, approximately 8-10 in number, suspicious for hepatic metastases.  No  findings specific for metastatic disease in the chest.  Additional ancillary findings as above.    01/27/2017 - 10/20/2017 Chemotherapy   The patient had gemcitabine (GEMZAR) 1,292 mg in sodium chloride 0.9 % 250 mL chemo infusion, 800 mg/m2 = 1,292 mg, Intravenous,  Once, 9 of 12 cycles Administration: 1,292 mg (01/28/2017), 1,292 mg (02/11/2017), 1,292 mg (02/25/2017), 1,292 mg (03/11/2017), 1,292 mg (03/25/2017), 1,292 mg (04/08/2017), 1,292 mg (04/22/2017), 1,292 mg (04/29/2017), 1,292 mg (05/20/2017), 1,292 mg (06/03/2017), 1,292 mg (06/24/2017), 1,292 mg (07/08/2017), 1,292 mg (07/29/2017), 1,292 mg (08/12/2017), 1,292 mg (08/26/2017), 1,292 mg (09/09/2017), 1,292 mg (09/23/2017), 1,292 mg (10/07/2017)  for chemotherapy treatment.    10/21/2017 - 09/22/2018 Chemotherapy   The patient had leucovorin 700 mg in dextrose 5 % 250 mL infusion, 644 mg, Intravenous,  Once, 22 of 22 cycles Administration: 700 mg (10/21/2017), 700 mg (11/04/2017), 700 mg (11/18/2017), 700 mg (12/02/2017), 700 mg (12/16/2017), 700 mg (12/30/2017), 700 mg (01/13/2018), 700 mg (01/27/2018), 700 mg (02/10/2018), 700 mg (02/24/2018), 700 mg (03/10/2018), 700 mg (03/24/2018), 700 mg (04/28/2018), 700 mg (05/17/2018), 700 mg (05/31/2018), 700 mg (06/16/2018), 700 mg (06/30/2018), 700 mg (07/21/2018), 700 mg (08/04/2018), 700 mg (08/25/2018), 700 mg (09/08/2018) ondansetron (ZOFRAN) 8 mg in sodium chloride 0.9 % 50 mL IVPB, , Intravenous,  Once, 21 of 21 cycles Administration:  (11/04/2017),  (01/27/2018),  (02/10/2018),  (02/24/2018),  (03/10/2018),  (03/24/2018),  (04/28/2018),  (05/17/2018),  (05/31/2018),  (06/16/2018),  (06/30/2018),  (07/21/2018),  (08/04/2018),  (08/25/2018),  (09/08/2018) fluorouracil (ADRUCIL) 3,850 mg in sodium chloride 0.9 % 73 mL chemo infusion, 2,400 mg/m2 = 3,850 mg, Intravenous, 1 Day/Dose, 22 of 22 cycles Dose modification: 1,920 mg/m2 (original dose 2,400 mg/m2, Cycle 2, Reason: Provider Judgment, Comment: mucisitis) Administration: 3,850 mg  (10/21/2017), 3,100 mg (11/04/2017), 3,100 mg (11/18/2017), 3,100 mg (12/02/2017), 3,100 mg (12/16/2017), 3,100 mg (12/30/2017), 3,100 mg (01/13/2018), 3,100 mg (01/27/2018), 3,100 mg (02/10/2018), 3,100 mg (02/24/2018), 3,100 mg (03/10/2018), 3,100 mg (03/24/2018), 3,100 mg (04/28/2018), 3,100 mg (05/17/2018), 3,100 mg (05/31/2018), 3,100 mg (06/16/2018), 3,100 mg (06/30/2018), 3,100 mg (07/21/2018), 3,100 mg (08/04/2018), 3,100 mg (08/25/2018), 3,100 mg (09/08/2018) irinotecan LIPOSOME (ONIVYDE) 81.7 mg in sodium chloride 0.9 % 500 mL chemo infusion, 50 mg/m2 = 81.7 mg (100 % of original dose 50 mg/m2), Intravenous, Once, 22 of 22 cycles Dose modification: 50 mg/m2 (original dose 50 mg/m2, Cycle 1, Reason: Provider Judgment), 50 mg/m2 (original dose 50 mg/m2, Cycle 2, Reason: Provider Judgment) Administration: 81.7 mg (10/21/2017), 81.7 mg (11/04/2017), 81.7 mg (11/18/2017), 81.7 mg (12/02/2017), 81.7 mg (12/16/2017), 81.7 mg (12/30/2017), 81.7 mg (01/13/2018), 81.7 mg (01/27/2018), 81.7 mg (02/10/2018), 81.7 mg (02/24/2018), 81.7 mg (03/10/2018), 81.7 mg (03/24/2018), 81.7 mg (04/28/2018), 81.7 mg (05/17/2018), 81.7 mg (05/31/2018), 81.7 mg (06/16/2018), 81.7 mg (06/30/2018), 81.7 mg (07/21/2018), 81.7 mg (08/04/2018), 81.7 mg (08/25/2018), 81.7 mg (09/08/2018)  for chemotherapy treatment.    09/22/2018 - 01/11/2019 Chemotherapy   The patient had palonosetron (ALOXI) injection 0.25 mg, 0.25 mg, Intravenous,  Once, 7 of 8 cycles Administration: 0.25 mg (09/22/2018), 0.25 mg (10/11/2018), 0.25 mg (10/25/2018), 0.25 mg (12/01/2018), 0.25 mg (12/15/2018), 0.25 mg (11/17/2018), 0.25 mg (12/29/2018) pegfilgrastim (NEULASTA) injection 6 mg, 6 mg, Subcutaneous, Once, 5 of 5 cycles Administration: 6 mg (10/13/2018), 6 mg (10/27/2018), 6 mg (12/03/2018),  6 mg (12/17/2018), 6 mg (11/19/2018) pegfilgrastim-cbqv (UDENYCA) injection 6 mg, 6 mg, Subcutaneous, Once, 1 of 2 cycles Administration: 6 mg (12/31/2018) leucovorin 628 mg in dextrose 5 % 250 mL infusion, 400 mg/m2 =  628 mg, Intravenous,  Once, 7 of 8 cycles Administration: 628 mg (09/22/2018), 628 mg (10/11/2018), 628 mg (10/25/2018), 628 mg (12/01/2018), 628 mg (12/15/2018), 628 mg (11/17/2018), 628 mg (12/29/2018) oxaliplatin (ELOXATIN) 100 mg in dextrose 5 % 500 mL chemo infusion, 65 mg/m2 = 100 mg (100 % of original dose 65 mg/m2), Intravenous,  Once, 7 of 8 cycles Dose modification: 65 mg/m2 (original dose 65 mg/m2, Cycle 1, Reason: Provider Judgment) Administration: 100 mg (09/22/2018), 100 mg (10/11/2018), 100 mg (10/25/2018), 100 mg (12/01/2018), 100 mg (12/15/2018), 100 mg (11/17/2018), 100 mg (12/29/2018) fluorouracil (ADRUCIL) chemo injection 500 mg, 320 mg/m2 = 500 mg (100 % of original dose 320 mg/m2), Intravenous,  Once, 7 of 8 cycles Dose modification: 320 mg/m2 (original dose 320 mg/m2, Cycle 1, Reason: Provider Judgment) Administration: 500 mg (09/22/2018), 500 mg (10/11/2018), 500 mg (10/25/2018), 500 mg (12/01/2018), 500 mg (12/15/2018), 500 mg (11/17/2018), 500 mg (12/29/2018) fluorouracil (ADRUCIL) 3,000 mg in sodium chloride 0.9 % 90 mL chemo infusion, 1,920 mg/m2 = 3,000 mg (100 % of original dose 1,920 mg/m2), Intravenous, 1 Day/Dose, 7 of 8 cycles Dose modification: 1,920 mg/m2 (original dose 1,920 mg/m2, Cycle 1, Reason: Provider Judgment) Administration: 3,000 mg (09/22/2018), 3,000 mg (10/11/2018), 3,000 mg (10/25/2018), 3,000 mg (12/01/2018), 3,000 mg (12/15/2018), 3,000 mg (11/17/2018), 3,000 mg (12/29/2018)  for chemotherapy treatment.    12/24/2018 Genetic Testing   Negative genetic testing on the common hereditary cancer panel.  The Common Hereditary Gene Panel offered by [facility] includes sequencing and/or deletion duplication testing of the following 48 genes: APC, ATM, AXIN2, BARD1, BMPR1A, BRCA1, BRCA2, BRIP1, CDH1, CDK4, CDKN2A (p14ARF), CDKN2A (p16INK4a), CHEK2, CTNNA1, DICER1, EPCAM (Deletion/duplication testing only), GREM1 (promoter region deletion/duplication testing only), KIT, MEN1, MLH1, MSH2, MSH3, MSH6,  MUTYH, NBN, NF1, NHTL1, PALB2, PDGFRA, PMS2, POLD1, POLE, PTEN, RAD50, RAD51C, RAD51D, RNF43, SDHB, SDHC, SDHD, SMAD4, SMARCA4. STK11, TP53, TSC1, TSC2, and VHL.  The following genes were evaluated for sequence changes only: SDHA and HOXB13 c.251G>A variant only. The report date is December 24, 2018.  DICER1 VUS identified.  This will not change medical decision making.   02/10/2019 -  Chemotherapy   The patient had palonosetron (ALOXI) injection 0.25 mg, 0.25 mg, Intravenous,  Once, 2 of 4 cycles Administration: 0.25 mg (02/10/2019), 0.25 mg (02/24/2019), 0.25 mg (03/10/2019), 0.25 mg (03/17/2019) pegfilgrastim-jmdb (FULPHILA) injection 6 mg, 6 mg, Subcutaneous,  Once, 2 of 4 cycles Administration: 6 mg (02/25/2019), 6 mg (03/18/2019) CISplatin (PLATINOL) 31 mg in sodium chloride 0.9 % 250 mL chemo infusion, 20 mg/m2 = 31 mg (80 % of original dose 25 mg/m2), Intravenous,  Once, 2 of 4 cycles Dose modification: 20 mg/m2 (80 % of original dose 25 mg/m2, Cycle 1, Reason: Other (see comments), Comment: renal dysfunction) Administration: 31 mg (02/10/2019), 31 mg (02/24/2019), 31 mg (03/10/2019), 31 mg (03/17/2019) gemcitabine (GEMZAR) 1,140 mg in sodium chloride 0.9 % 250 mL chemo infusion, 750 mg/m2 = 1,140 mg (75 % of original dose 1,000 mg/m2), Intravenous,  Once, 2 of 4 cycles Dose modification: 750 mg/m2 (75 % of original dose 1,000 mg/m2, Cycle 1, Reason: Provider Judgment) Administration: 1,140 mg (02/10/2019), 1,140 mg (02/24/2019), 1,140 mg (03/10/2019), 1,140 mg (03/17/2019) fosaprepitant (EMEND) 150 mg, dexamethasone (DECADRON) 12 mg in sodium chloride 0.9 % 145 mL IVPB, , Intravenous,  Once, 2 of 4 cycles Administration:  (02/10/2019),  (02/24/2019),  (03/10/2019),  (03/17/2019)  for chemotherapy treatment.       CANCER STAGING: Cancer Staging Pancreatic cancer metastasized to liver [facility]) Staging form: Pancreas, AJCC 7th Edition - Clinical: Stage IV (T3, N1, M1) - Signed by Baird Cancer,  PA-C on 03/16/2014 - Pathologic: No stage assigned - Unsigned    INTERVAL HISTORY:  Ms. Wrench 85 y.o. female seen for follow-up of metastatic pancreatic cancer, and toxicity assessment.  She received cycle 2-day 1 on 03/10/2019 and day 8 on 03/17/2019.  She reported some constipation and is taking stool softeners which is helping.  She reported ankle swellings in the evenings.  Numbness in the feet has been stable.  She reported upper back pain 2 to 3 days ago.  She was apparently very active and raking leaves.  REVIEW OF SYSTEMS:  Review of Systems  Constitutional: Positive for fatigue.  Cardiovascular: Positive for leg swelling.  Gastrointestinal: Positive for constipation.  Neurological: Positive for headaches and numbness.  All other systems reviewed and are negative.    PAST MEDICAL/SURGICAL HISTORY:  Past Medical History:  Diagnosis Date   Anemia due to antineoplastic chemotherapy 09/12/2015   Started Aranesp 500 mcg on 09/12/2015   Anxiety    Chronic diarrhea    Depression    DNR (do not resuscitate) 08/16/2015   Erosive esophagitis    Family history of breast cancer in female    Family history of prostate cancer    GERD (gastroesophageal reflux disease)    Hx of adenomatous colonic polyps    tubular adenomas, last found in 2008   Hyperplastic colon polyp 03/19/10   tcs by Dr. Gala Romney   Hypertension 20 years    Kidney stone    hx/ crushed    Opioid contract exists 04/18/2015   With Dr. Moshe Cipro   Pancreatic cancer [facility]) 02/2014   Pancreatic cancer metastasized to liver ([place]) 03/10/2014   Schatzki's ring 08/26/10   Last dilated on EGD by Dr. Trevor Iha [facility], linear gastric erosions, BI hemigastrectomy   Past Surgical History:  Procedure Laterality Date   BREAST LUMPECTOMY Left    bunion removal     from both [place]  03/19/2010   DR Gala Romney,, normal TI, pancolonic diverticula, random colon bx neg., hyperplastic polyps  removed   ESOPHAGOGASTRODUODENOSCOPY  11/22/2003   DR Gala Romney, erosive RE, Billroth I   ESOPHAGOGASTRODUODENOSCOPY  08/26/10   Dr. Gala Romney- moderate severe ERE, Scahtzki ring s/p dilation, Billroth I, linear gastric erosions, bx-gastric xanthelasma   LEFT SHOULDER SURGERY  2009   DR HARRISON   ORIF ANKLE FRACTURE Right 05/22/2013   Procedure: OPEN REDUCTION INTERNAL FIXATION (ORIF) RIGHT ANKLE FRACTURE;  Surgeon: Sanjuana Kava, MD;  Location: [facility];  Service: Orthopedics;  Laterality: Right;   stomach ulcer  50 years ago    had some of her stomach removed      SOCIAL HISTORY:  Social History   Socioeconomic History   Marital status: Widowed    Spouse name: Not on file   Number of children: 2   Years of education: 44   Highest education level: 12th grade  Occupational History   Occupation: retired from Estate manager/land agent: [place] resource strain: Not hard at all   Food insecurity    Worry: Never true    Inability: Never true   Transportation needs  Medical: No    Non-medical: No  Tobacco Use   Smoking status: Current Some Day Smoker    Packs/day: 0.50    Years: 60.00    Pack years: 30.00    Types: Cigarettes    Last attempt to quit: 09/09/2016    Years since quitting: 2.5   Smokeless tobacco: Never Used  Substance and Sexual Activity   Alcohol use: No   Drug use: No   Sexual activity: Not Currently  Lifestyle   Physical activity    Days per week: 3 days    Minutes per session: 30 min   Stress: Not at all  Relationships   Social connections    Talks on phone: More than three times a week    Gets together: Three times a week    Attends religious service: 1 to 4 times per year    Active member of club or organization: No    Attends meetings of clubs or organizations: Never    Relationship status: Widowed   Intimate partner violence    Fear of current or ex partner: No    Emotionally abused: No    Physically abused:  No    Forced sexual activity: No  Other Topics Concern   Not on file  Social History Narrative   Lives with son alone     FAMILY HISTORY:  Family History  Problem Relation Age of Onset   Hypertension Mother    Kidney failure Brother        on dialysis   Diabetes Sister    Diabetes Sister    Hypertension Father    Kidney failure Sister    Kidney Stones Brother    Breast cancer Son 42   Gout Son 66   Gout Nephew 57   Liver disease Neg Hx    Colon cancer Neg Hx     CURRENT MEDICATIONS:  Outpatient Encounter Medications as of 03/31/2019  Medication Sig   acetaminophen (TYLENOL) 500 MG tablet Take 500 mg by mouth every 6 (six) hours as needed for mild pain or moderate pain.    amLODipine (NORVASC) 10 MG tablet TAKE 1 TABLET DAILY   CISPLATIN IV Inject into the vein. Days 1, 8 every 21 days   clobetasol (TEMOVATE) 0.05 % external solution APP AA ON SCALP BID PRN NOT TO FACE GROIN OR UNDERARMS   clonazePAM (KLONOPIN) 0.5 MG tablet Take 1 tablet (0.5 mg total) by mouth at bedtime.   cycloSPORINE (RESTASIS) 0.05 % ophthalmic emulsion Place 1 drop into both eyes 2 (two) times daily.   diphenhydrAMINE (BENADRYL) 50 MG tablet Take 1 tablet 1 hour prior to CT   fluorometholone (FML) 0.1 % ophthalmic suspension INSTILL 1 DROP INTO EACH EYE THREE TIMES DAILY FOR 14 DAYS   fluticasone (FLONASE) 50 MCG/ACT nasal spray Place 1 spray into both nostrils daily.   GEMCITABINE HCL IV Inject into the vein. Days 1, 8 every 21 days   lidocaine (XYLOCAINE) 2 % solution RINSE WITH 5ML AS NEEDED TO RELIEVE MOUTH PAIN.   lidocaine-prilocaine (EMLA) cream Apply 1 application topically as needed. Apply to portacath site as needed   loperamide (IMODIUM A-D) 2 MG tablet Take 2 mg by mouth 4 (four) times daily as needed for diarrhea or loose stools.   metoprolol tartrate (LOPRESSOR) 25 MG tablet Take one tablet once daily for blood pressure   mirtazapine (REMERON) 15 MG tablet  Take 15 mg by mouth at bedtime.   MYRBETRIQ 50 MG TB24 tablet TAKE  1 TABLET DAILY   pantoprazole (PROTONIX) 20 MG tablet TAKE 1 TABLET DAILY   senna-docusate (SENOKOT-S) 8.6-50 MG tablet Take 1 tablet by mouth daily.   [DISCONTINUED] prochlorperazine (COMPAZINE) 10 MG tablet Take 1 tablet (10 mg total) by mouth every 6 (six) hours as needed (Nausea or vomiting).   No facility-administered encounter medications on file as of 03/31/2019.     ALLERGIES:  Allergies  Allergen Reactions   Iohexol Hives and Shortness Of Breath    **Patient can not have IV contrast, pt has breakthrough reaction even after 13 hour premeds** Do not give IV contrast PATIENT HAD TO BE TAKEN TO ED, C/O WHELPS, HIVES, DIFFICULTY BREATHING. PATIENT GIVEN IV CONTRAST AFTER 13 HOUR PRE MEDS ON 01/09/2017 WITH NO REACTION NOTED. On 10/19/17 patient had reaction despite premedication.     Aciphex [Rabeprazole Sodium] Other (See Comments)    unknown   Amlodipine Besylate-Valsartan     Rash to high-dose (5/320)   Esomeprazole Magnesium    Omeprazole Other (See Comments)    Patient states "medication didn't work"   Ciprofloxacin Rash   Penicillins Swelling and Rash    Has patient had a PCN reaction causing immediate rash, facial/tongue/throat swelling, SOB or lightheadedness with hypotension: Yes Has patient had a PCN reaction causing severe rash involving mucus membranes or skin necrosis: Yes Has patient had a PCN reaction that required hospitalization: No Has patient had a PCN reaction occurring within the last 10 years: yes If all of the above answers are "NO", then may proceed with Cephalosporin use.    Tessalon Perles [Benzonatate] Itching    Full head, face, neck, and chest itching.      PHYSICAL EXAM:  ECOG Performance status: 1  Vitals:   03/31/19 0801  BP: (!) 151/46  Pulse: (!) 51  Resp: 18  Temp: (!) 97.2 F (36.2 C)  SpO2: 100%   Filed Weights   03/31/19 0801  Weight: 113 lb 9.6 oz  (51.5 kg)    Physical Exam Vitals signs reviewed.  Constitutional:      Appearance: Normal appearance.  Cardiovascular:     Rate and Rhythm: Normal rate and regular rhythm.     Heart sounds: Normal heart sounds.  Pulmonary:     Effort: Pulmonary effort is normal.     Breath sounds: Normal breath sounds.  Abdominal:     General: There is no distension.     Palpations: Abdomen is soft. There is no mass.  Musculoskeletal:        General: No swelling.  Skin:    General: Skin is warm.  Neurological:     General: No focal deficit present.     Mental Status: She is alert and oriented to person, place, and time.  Psychiatric:        Mood and Affect: Mood normal.        Behavior: Behavior normal.      LABORATORY DATA:  I have reviewed the labs as listed.  CBC    Component Value Date/Time   WBC 15.5 (H) 03/31/2019 0756   RBC 2.87 (L) 03/31/2019 0756   HGB 8.8 (L) 03/31/2019 0756   HCT 27.5 (L) 03/31/2019 0756   PLT 128 (L) 03/31/2019 0756   MCV 95.8 03/31/2019 0756   MCH 30.7 03/31/2019 0756   MCHC 32.0 03/31/2019 0756   RDW 18.6 (H) 03/31/2019 0756   LYMPHSABS 1.5 03/31/2019 0756   MONOABS 1.0 03/31/2019 0756   EOSABS 0.2 03/31/2019 0756   BASOSABS 0.0  03/31/2019 0756   CMP Latest Ref Rng & Units 03/17/2019 03/10/2019 02/24/2019  Glucose 70 - 99 mg/dL 136(H) 113(H) 93  BUN 8 - 23 mg/dL 27(H) 19 28(H)  Creatinine 0.44 - 1.00 mg/dL 1.24(H) 1.20(H) 1.28(H)  Sodium 135 - 145 mmol/L 140 140 138  Potassium 3.5 - 5.1 mmol/L 3.7 3.9 3.6  Chloride 98 - 111 mmol/L 111 107 106  CO2 22 - 32 mmol/L _0 Calcium 8.9 - 10.3 mg/dL 8.3(L) 8.3(L) 8.3(L)  Total Protein 6.5 - 8.1 g/dL 7.1 7.0 7.4  Total Bilirubin 0.3 - 1.2 mg/dL 0.4 0.2(L) 0.3  Alkaline Phos 38 - 126 U/L 142(H) 160(H) 146(H)  AST 15 - 41 U/L 16 13(L) 17  ALT 0 - 44 U/L _1 DIAGNOSTIC IMAGING:  I have independently reviewed the scans and discussed with the patient.   I have reviewed Venita Lick LPN's note and agree with the documentation.  I personally performed a face-to-face visit, made revisions and my assessment and plan is as follows.    ASSESSMENT & PLAN:   Pancreatic cancer metastasized to liver ([place]) 1.  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 - Gemcitabine and Abraxane from normal 2015 through August 2018 with progression. -Gemcitabine and Tarceva from 01/28/2017 through 10/07/2017 with progression. -21 cycles of infusional 5-FU and Onivyde from 10/21/2017-09/08/2018. -7 cycles of FOLFOX from 09/22/2018-12/29/2018. -CT CAP on 01/10/2019 showed increase in size of dominant hepatic lesion near the dome measuring 4.7 cm, previously 2.6 cm.  Other lesions in the liver are stable.  No new lesions seen.  No lesions in the lungs. - I discussed about available clinical trial at [facility] for K-ras G 12 V mutations.  She does not want to travel that far. 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  MSI high was not detected. - Cycle 1 of gemcitabine (750 mg/M square) and cisplatin (20 mg/M square) on 02/10/2019.  Day 15 dose was on 02/24/2019. -Cycle 2 of gemcitabine and cisplatin on 03/10/2019, received day 8 on 03/17/2019.  2.  Weight loss: -Weight has been stable in the last 3 weeks.  3.  Genetic testing: -Germline mutation testing was negative on 12/24/2018.  Total time spent is 25 minutes with more than 50% of the time spent face-to-face discussing new treatment plan, counseling and coordination of care.    Orders placed this encounter:  No orders of the defined types were placed in this encounter.     Derek Jack, MD [place] [phone]

## 2019-03-31 NOTE — Assessment & Plan Note (Addendum)
1.  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 - Gemcitabine and Abraxane from normal 2015 through August 2018 with progression. -Gemcitabine and Tarceva from 01/28/2017 through 10/07/2017 with progression. -21 cycles of infusional 5-FU and Onivyde from 10/21/2017-09/08/2018. -7 cycles of FOLFOX from 09/22/2018-12/29/2018. -CT CAP on 01/10/2019 showed increase in size of dominant hepatic lesion near the dome measuring 4.7 cm, previously 2.6 cm.  Other lesions in the liver are stable.  No new lesions seen.  No lesions in the lungs. - I discussed about available clinical trial at [facility] for K-ras G 12 V mutations.  She does not want to travel that far. 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  MSI high was not detected. - Cycle 1 of gemcitabine (750 mg/M square) and cisplatin (20 mg/M square) on 02/10/2019.  Day 15 dose was on 02/24/2019. -Cycle 2 of gemcitabine and cisplatin on 03/10/2019, received day 8 on 03/17/2019. -She tolerated last cycle very well.  We reviewed her labs.  She will proceed with day 1 cycle 3 today.  I will switch her treatments to day 1 and day 15 every 28 days. -She developed mid back pain with radiation to the ribs 2 to 3 days ago.  Son reports that she has been very active outside raking leaves and doing yard work.  Most likely explanation is musculoskeletal pain.  I will see her back in 2 weeks for follow-up.  2.  Weight loss: -Weight has been stable in the last 3 weeks.  3.  Genetic testing: -Germline mutation testing was negative on 12/24/2018.

## 2019-03-31 NOTE — Progress Notes (Signed)
0830 Labs reviewed with and pt seen by Dr. Delton Coombes and pt approved for Cisplatin and Gemzar today per MD                          [name] tolerated chemo tx well without complaints or incident. VSS upon discharge. Pt discharged self ambulatory using her cane in satisfactory condition

## 2019-04-05 ENCOUNTER — Other Ambulatory Visit: Payer: Self-pay

## 2019-04-05 ENCOUNTER — Ambulatory Visit: Payer: Medicare HMO | Admitting: Family Medicine

## 2019-04-11 ENCOUNTER — Other Ambulatory Visit: Payer: Self-pay | Admitting: Family Medicine

## 2019-04-12 ENCOUNTER — Ambulatory Visit: Payer: Medicare HMO

## 2019-04-14 ENCOUNTER — Inpatient Hospital Stay (HOSPITAL_COMMUNITY): Payer: Medicare HMO | Attending: Hematology

## 2019-04-14 ENCOUNTER — Encounter (HOSPITAL_COMMUNITY): Payer: Self-pay

## 2019-04-14 ENCOUNTER — Other Ambulatory Visit: Payer: Self-pay

## 2019-04-14 ENCOUNTER — Encounter (HOSPITAL_COMMUNITY): Payer: Self-pay | Admitting: Hematology

## 2019-04-14 ENCOUNTER — Inpatient Hospital Stay (HOSPITAL_COMMUNITY): Payer: Medicare HMO

## 2019-04-14 ENCOUNTER — Inpatient Hospital Stay (HOSPITAL_BASED_OUTPATIENT_CLINIC_OR_DEPARTMENT_OTHER): Payer: Medicare HMO | Admitting: Hematology

## 2019-04-14 VITALS — BP 137/47 | HR 57 | Temp 96.8°F | Resp 18

## 2019-04-14 VITALS — BP 141/47 | HR 54 | Temp 97.8°F | Resp 18 | Wt 116.6 lb

## 2019-04-14 DIAGNOSIS — D649 Anemia, unspecified: Secondary | ICD-10-CM | POA: Insufficient documentation

## 2019-04-14 DIAGNOSIS — Z5111 Encounter for antineoplastic chemotherapy: Secondary | ICD-10-CM | POA: Insufficient documentation

## 2019-04-14 DIAGNOSIS — C259 Malignant neoplasm of pancreas, unspecified: Secondary | ICD-10-CM

## 2019-04-14 DIAGNOSIS — C252 Malignant neoplasm of tail of pancreas: Secondary | ICD-10-CM | POA: Diagnosis present

## 2019-04-14 DIAGNOSIS — C787 Secondary malignant neoplasm of liver and intrahepatic bile duct: Secondary | ICD-10-CM

## 2019-04-14 LAB — COMPREHENSIVE METABOLIC PANEL
ALT: 10 U/L (ref 0–44)
AST: 15 U/L (ref 15–41)
Albumin: 3.4 g/dL — ABNORMAL LOW (ref 3.5–5.0)
Alkaline Phosphatase: 167 U/L — ABNORMAL HIGH (ref 38–126)
Anion gap: 10 (ref 5–15)
BUN: 35 mg/dL — ABNORMAL HIGH (ref 8–23)
CO2: 20 mmol/L — ABNORMAL LOW (ref 22–32)
Calcium: 7.9 mg/dL — ABNORMAL LOW (ref 8.9–10.3)
Chloride: 109 mmol/L (ref 98–111)
Creatinine, Ser: 1.33 mg/dL — ABNORMAL HIGH (ref 0.44–1.00)
GFR calc Af Amer: 41 mL/min — ABNORMAL LOW (ref >60)
GFR calc non Af Amer: 36 mL/min — ABNORMAL LOW (ref >60)
Glucose, Bld: 105 mg/dL — ABNORMAL HIGH (ref 70–99)
Potassium: 3.6 mmol/L (ref 3.5–5.1)
Sodium: 139 mmol/L (ref 135–145)
Total Bilirubin: 0.6 mg/dL (ref 0.3–1.2)
Total Protein: 6.3 g/dL — ABNORMAL LOW (ref 6.5–8.1)

## 2019-04-14 LAB — CBC WITH DIFFERENTIAL/PLATELET
Abs Immature Granulocytes: 0.01 10*3/uL (ref 0.00–0.07)
Basophils Absolute: 0 10*3/uL (ref 0.0–0.1)
Basophils Relative: 0 %
Eosinophils Absolute: 0.1 10*3/uL (ref 0.0–0.5)
Eosinophils Relative: 3 %
HCT: 24.1 % — ABNORMAL LOW (ref 36.0–46.0)
Hemoglobin: 7.7 g/dL — ABNORMAL LOW (ref 12.0–15.0)
Immature Granulocytes: 0 %
Lymphocytes Relative: 15 %
Lymphs Abs: 0.8 10*3/uL (ref 0.7–4.0)
MCH: 30.8 pg (ref 26.0–34.0)
MCHC: 32 g/dL (ref 30.0–36.0)
MCV: 96.4 fL (ref 80.0–100.0)
Monocytes Absolute: 0.5 10*3/uL (ref 0.1–1.0)
Monocytes Relative: 10 %
Neutro Abs: 3.8 10*3/uL (ref 1.7–7.7)
Neutrophils Relative %: 72 %
Platelets: 108 10*3/uL — ABNORMAL LOW (ref 150–400)
RBC: 2.5 MIL/uL — ABNORMAL LOW (ref 3.87–5.11)
RDW: 18.7 % — ABNORMAL HIGH (ref 11.5–15.5)
WBC: 5.3 10*3/uL (ref 4.0–10.5)
nRBC: 0 % (ref 0.0–0.2)

## 2019-04-14 LAB — LACTATE DEHYDROGENASE: LDH: 132 U/L (ref 98–192)

## 2019-04-14 LAB — MAGNESIUM: Magnesium: 1.8 mg/dL (ref 1.7–2.4)

## 2019-04-14 LAB — PREPARE RBC (CROSSMATCH)

## 2019-04-14 MED ORDER — SODIUM CHLORIDE 0.9 % IV SOLN
Freq: Once | INTRAVENOUS | Status: AC
  Administered 2019-04-14: 11:00:00 via INTRAVENOUS
  Filled 2019-04-14: qty 5

## 2019-04-14 MED ORDER — SODIUM CHLORIDE 0.9 % IV SOLN
20.0000 mg/m2 | Freq: Once | INTRAVENOUS | Status: AC
  Administered 2019-04-14: 13:00:00 31 mg via INTRAVENOUS
  Filled 2019-04-14: qty 31

## 2019-04-14 MED ORDER — SODIUM CHLORIDE 0.9 % IV SOLN: Freq: Once | INTRAVENOUS | Status: DC

## 2019-04-14 MED ORDER — SODIUM CHLORIDE 0.9 % IV SOLN
750.0000 mg/m2 | Freq: Once | INTRAVENOUS | Status: AC
  Administered 2019-04-14: 12:00:00 1140 mg via INTRAVENOUS
  Filled 2019-04-14: qty 26.3

## 2019-04-14 MED ORDER — HEPARIN SOD (PORK) LOCK FLUSH 100 UNIT/ML IV SOLN
500.0000 [IU] | Freq: Once | INTRAVENOUS | Status: AC | PRN
  Administered 2019-04-14: 500 [IU]

## 2019-04-14 MED ORDER — POTASSIUM CHLORIDE 2 MEQ/ML IV SOLN
Freq: Once | INTRAVENOUS | Status: AC
  Administered 2019-04-14: 09:00:00 via INTRAVENOUS
  Filled 2019-04-14: qty 10

## 2019-04-14 MED ORDER — PALONOSETRON HCL INJECTION 0.25 MG/5ML
0.2500 mg | Freq: Once | INTRAVENOUS | Status: AC
  Administered 2019-04-14: 0.25 mg via INTRAVENOUS
  Filled 2019-04-14: qty 5

## 2019-04-14 MED ORDER — SODIUM CHLORIDE 0.9 % IV SOLN
Freq: Once | INTRAVENOUS | Status: AC
  Administered 2019-04-14: 09:00:00 via INTRAVENOUS

## 2019-04-14 MED ORDER — SODIUM CHLORIDE 0.9% FLUSH
10.0000 mL | INTRAVENOUS | Status: DC | PRN
  Administered 2019-04-14: 10 mL
  Filled 2019-04-14: qty 10

## 2019-04-14 NOTE — Patient Instructions (Signed)
[facility] Discharge Instructions for Patients Receiving Chemotherapy   Beginning January 23rd 2017 lab work for the [facility] will be done in the  Main lab at [facility] on 1st floor. If you have a lab appointment with the [facility] please come in thru the  Main Entrance and check in at the main information desk   Today you received the following chemotherapy agents Cisplatin and Gemzar. Follow-up as scheduled. Call clinic for any questions or concerns  To help prevent nausea and vomiting after your treatment, we encourage you to take your nausea medication   If you develop nausea and vomiting, or diarrhea that is not controlled by your medication, call the clinic.  The clinic phone number is [phone]. Office hours are Monday-Friday 8:30am-5:00pm.  BELOW ARE SYMPTOMS THAT SHOULD BE REPORTED IMMEDIATELY:  *FEVER GREATER THAN 101.0 F  *CHILLS WITH OR WITHOUT FEVER  NAUSEA AND VOMITING THAT IS NOT CONTROLLED WITH YOUR NAUSEA MEDICATION  *UNUSUAL SHORTNESS OF BREATH  *UNUSUAL BRUISING OR BLEEDING  TENDERNESS IN MOUTH AND THROAT WITH OR WITHOUT PRESENCE OF ULCERS  *URINARY PROBLEMS  *BOWEL PROBLEMS  UNUSUAL RASH Items with * indicate a potential emergency and should be followed up as soon as possible. If you have an emergency after office hours please contact your primary care physician or go to the nearest emergency department.  Please call the clinic during office hours if you have any questions or concerns.   You may also contact the Patient Navigator at [phone] should you have any questions or need assistance in obtaining follow up care.      Resources For Cancer Patients and their Caregivers ? American Cancer Society: Can assist with transportation, wigs, general needs, runs Look Good Feel Better.        (218) 808-9007 ? Cancer Care: Provides financial assistance, online support groups, medication/co-pay assistance.   1-800-813-HOPE (548) 156-5805) ? [place] Assists [place] cancer patients and their families through emotional , educational and financial support.  3314545626 ? [place] DSS Where to apply for food stamps, Medicaid and utility assistance. 229-218-1473 ? [facility]: Transportation to medical appointments. 662 084 3907 ? Social Security Administration: May apply for disability if have a Stage IV cancer. 614-156-3117 [phone] ? [employer], Disability and Transit Services: Assists with nutrition, care and transit needs. [phone]

## 2019-04-14 NOTE — Patient Instructions (Signed)
[facility] at [facility] Discharge Instructions  Labs drawn from portacath today   Thank you for choosing [facility] at [facility] to provide your oncology and hematology care.  To afford each patient quality time with our provider, please arrive at least 15 minutes before your scheduled appointment time.   If you have a lab appointment with the Cancer Center please come in thru the Main Entrance and check in at the main information desk.  You need to re-schedule your appointment should you arrive 10 or more minutes late.  We strive to give you quality time with our providers, and arriving late affects you and other patients whose appointments are after yours.  Also, if you no show three or more times for appointments you may be dismissed from the clinic at the providers discretion.     Again, thank you for choosing [facility].  Our hope is that these requests will decrease the amount of time that you wait before being seen by our physicians.       _____________________________________________________________  Should you have questions after your visit to [facility], please contact our office at [phone] between the hours of 8:00 a.m. and 4:30 p.m.  Voicemails left after 4:00 p.m. will not be returned until the following business day.  For prescription refill requests, have your pharmacy contact our office and allow 72 hours.    Due to Covid, you will need to wear a mask upon entering the hospital. If you do not have a mask, a mask will be given to you at the Main Entrance upon arrival. For doctor visits, patients may have 1 support person with them. For treatment visits, patients can not have anyone with them due to social distancing guidelines and our immunocompromised population.     

## 2019-04-14 NOTE — Patient Instructions (Addendum)
[place] at [facility] Discharge Instructions  You were seen today by Dr. Delton Coombes. He went over your recent lab results. He will see you back tomorrow for fluids and blood transfusion. Return in 2 weeks for labs and treatment.   Thank you for choosing [place] at [facility] to provide your oncology and hematology care.  To afford each patient quality time with our provider, please arrive at least 15 minutes before your scheduled appointment time.   If you have a lab appointment with the [facility] please come in thru the  Main Entrance and check in at the main information desk  You need to re-schedule your appointment should you arrive 10 or more minutes late.  We strive to give you quality time with our providers, and arriving late affects you and other patients whose appointments are after yours.  Also, if you no show three or more times for appointments you may be dismissed from the clinic at the providers discretion.     Again, thank you for choosing [facility].  Our hope is that these requests will decrease the amount of time that you wait before being seen by our physicians.       _____________________________________________________________  Should you have questions after your visit to [facility], please contact our office at [phone] between the hours of 8:00 a.m. and 4:30 p.m.  Voicemails left after 4:00 p.m. will not be returned until the following business day.  For prescription refill requests, have your pharmacy contact our office and allow 72 hours.    Cancer Center Support Programs:   > Cancer Support Group  2nd Tuesday of the month 1pm-2pm, Journey Room

## 2019-04-14 NOTE — Assessment & Plan Note (Signed)
1.  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 - Gemcitabine and Abraxane from normal 2015 through August 2018 with progression. -Gemcitabine and Tarceva from 01/28/2017 through 10/07/2017 with progression. -21 cycles of infusional 5-FU and Onivyde from 10/21/2017-09/08/2018. -7 cycles of FOLFOX from 09/22/2018-12/29/2018. -CT CAP on 01/10/2019 showed increase in size of dominant hepatic lesion near the dome measuring 4.7 cm, previously 2.6 cm.  Other lesions in the liver are stable.  No new lesions seen.  No lesions in the lungs. - I discussed about available clinical trial at [facility] for K-ras G 12 V mutations.  She does not want to travel that far. 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  MSI high was not detected. - Cycle 1 of gemcitabine (750 mg/M square) and cisplatin (20 mg/M square) on 02/10/2019.  Day 15 dose was on 02/24/2019. -Cycle 2 of gemcitabine and cisplatin on 03/10/2019, received day 8 on 03/17/2019. -Cycle 3-day 1 of gemcitabine and cisplatin on 03/31/2019.  We switched her treatments to day 1 and day 15. -She tolerated her treatment reasonably well.  She gained 2 pounds.  She has eaten better. -She continued to have bilateral lower rib pain.  She developed this pain 3 weeks ago when she was raking leaves in the back yard.  This is manageable with Tylenol at this time.  We will keep a close eye on it. -We reviewed her blood work.  It is adequate to proceed with cycle 3-day 15 today.  We will reevaluate her in 2 weeks. -Her creatinine is 1.3.  She will receive 1 L of fluids today and tomorrow. -Her hemoglobin is 7.7.  I will give her 1 unit of blood transfusion.  2.  Weight loss: -She has gained couple of pounds in the last 2 weeks.  3.  Genetic testing: -Germline mutation testing was negative on 12/24/2018.

## 2019-04-14 NOTE — Progress Notes (Signed)
1040 Labs reviewed with and pt seen by Dr. Delton Coombes and pt approved for Cisplatin and Gemzar infusions today as well as 1 unit of blood and IV hydration with magnesium and potassium over 2 hours tomorrow per MD                                                        Alecia Lemming tolerated chemo tx well without complaints or incident. Port left accessed,saline locked and flushed easily per protocol for use tomorrow. VSS upon discharge. Pt discharged self ambulatory using her cane in satisfactory condition

## 2019-04-14 NOTE — Progress Notes (Signed)
[place], [place] [address]   CLINIC:  Medical Oncology/Hematology  PCP:  Fayrene Helper, MD [address], [place] [address] [phone]   REASON FOR VISIT:  Follow-up for metastatic pancreatic cancer to the liver   BRIEF ONCOLOGIC HISTORY:  Oncology History  Pancreatic cancer metastasized to liver ([place])  03/10/2014 Initial Diagnosis   Pancreatic cancer metastasized to liver   03/22/2014 - 03/05/2016 Chemotherapy   Abraxane/Gemzar days 1, 8, every 28 days.  Day 15 was cancelled due to leukopenia and thrombocytopenia on day 15 cycle 1.   05/24/2014 Treatment Plan Change   Day 8 of cycle 3 is held with ANC of 1.1   06/05/2014 Imaging   CT C/A/P . Interval decrease in size of the pancreatic tail mass.Improved hepatic metastatic disease. No new lesions. No CT findings for metastatic disease involving the chest.   08/09/2014 Tumor Marker   CA 19-9= 33 (WNL)   10/26/2014 Imaging   MRI- Continued interval decrease in size of the hepatic metastatic lesions and no new lesions are identified. Continued decrease in size of the pancreatic tail lesion.   01/03/2015 Tumor Marker   Results for [name], [name] (MRN [id number]) as of 01/18/2015 12:52  01/03/2015 10:00 CA 19-9: 14    01/17/2015 Imaging   MRI- Response to therapy of hepatic metastasis.  Similar size of a pancreatic tail lesion.   03/20/2015 Imaging   MRI-L spine- Severe disc space narrowing at L2-L3, with endplate reactive changes. Large disc extrusion into the ventral epidural space,central to the RIGHT with a cephalad migrated free fragment. Additional Large disc extrusion into the retroperitone...   03/22/2015 Imaging   CT pelvis- No evidence for metastatic disease within the pelvis.   07/24/2015 Imaging   MRI abd- Continued response to therapy, with no residual detectable liver metastases. No new sites of metastatic disease in the abdomen.    08/15/2015 Code Status     She confirms desire for DNR status.   12/21/2015 Imaging   MRI liver- Severe image degradation due to motion artifact reducing diagnostic sensitivity and specificity. Reduce conspicuity of the pancreatic tail lesion suggesting further improvement. The original liver lesions have resolved.   03/05/2016 Treatment Plan Change   Chemotherapy holiday/Break after 2 years worth of treatment   04/07/2016 Imaging   CT chest- When compared to recent chest CT, new minimally displaced anterior left sixth rib fracture. Slight increase in subcarinal adenopathy   04/28/2016 Imaging   Bone density- BMD as determined from Femur Neck Right is 0.705 g/cm2 with a T-Score of -2.4. This patient is considered osteopenic according to [facility]) criteria. Compared with the prior study on 02/23/2013, the BMD of the lumbar spine/rt. femoral neck show a statistically significant decrease.    05/23/2016 Imaging   MRI abd- 1. Exam is significantly degraded by patient respiratory motion. Consider follow-up exams with CT abdomen with without contrast per pancreatic protocol. 2. Fullness in tail of pancreas is more prominent and potentially increased in size. Recommend close attention on follow-up. (Consider CT as above) 3. No explanation for back pain.   09/11/2016 Imaging   MRI pancreas- Interval progression of pancreatic tail lesion. New differential perfusion right liver with areas of heterogeneity. Underlying metastatic disease in this region not excluded.   09/11/2016 Progression   MRi in conjunction with rising CA 19-9 are indicative of relapse of disease.   01/09/2017 Progression   CT C/A/P: 2.1 x 2.9  cm lesion in the pancreatic tail and abutting the splenic hilum, corresponding to known primary pancreatic neoplasm, mildly increased.  Scattered small hypoenhancing lesions in the liver measuring up to 10 mm, approximately 8-10 in number, suspicious for hepatic metastases.  No  findings specific for metastatic disease in the chest.  Additional ancillary findings as above.    01/27/2017 - 10/20/2017 Chemotherapy   The patient had gemcitabine (GEMZAR) 1,292 mg in sodium chloride 0.9 % 250 mL chemo infusion, 800 mg/m2 = 1,292 mg, Intravenous,  Once, 9 of 12 cycles Administration: 1,292 mg (01/28/2017), 1,292 mg (02/11/2017), 1,292 mg (02/25/2017), 1,292 mg (03/11/2017), 1,292 mg (03/25/2017), 1,292 mg (04/08/2017), 1,292 mg (04/22/2017), 1,292 mg (04/29/2017), 1,292 mg (05/20/2017), 1,292 mg (06/03/2017), 1,292 mg (06/24/2017), 1,292 mg (07/08/2017), 1,292 mg (07/29/2017), 1,292 mg (08/12/2017), 1,292 mg (08/26/2017), 1,292 mg (09/09/2017), 1,292 mg (09/23/2017), 1,292 mg (10/07/2017)  for chemotherapy treatment.    10/21/2017 - 09/22/2018 Chemotherapy   The patient had leucovorin 700 mg in dextrose 5 % 250 mL infusion, 644 mg, Intravenous,  Once, 22 of 22 cycles Administration: 700 mg (10/21/2017), 700 mg (11/04/2017), 700 mg (11/18/2017), 700 mg (12/02/2017), 700 mg (12/16/2017), 700 mg (12/30/2017), 700 mg (01/13/2018), 700 mg (01/27/2018), 700 mg (02/10/2018), 700 mg (02/24/2018), 700 mg (03/10/2018), 700 mg (03/24/2018), 700 mg (04/28/2018), 700 mg (05/17/2018), 700 mg (05/31/2018), 700 mg (06/16/2018), 700 mg (06/30/2018), 700 mg (07/21/2018), 700 mg (08/04/2018), 700 mg (08/25/2018), 700 mg (09/08/2018) ondansetron (ZOFRAN) 8 mg in sodium chloride 0.9 % 50 mL IVPB, , Intravenous,  Once, 21 of 21 cycles Administration:  (11/04/2017),  (01/27/2018),  (02/10/2018),  (02/24/2018),  (03/10/2018),  (03/24/2018),  (04/28/2018),  (05/17/2018),  (05/31/2018),  (06/16/2018),  (06/30/2018),  (07/21/2018),  (08/04/2018),  (08/25/2018),  (09/08/2018) fluorouracil (ADRUCIL) 3,850 mg in sodium chloride 0.9 % 73 mL chemo infusion, 2,400 mg/m2 = 3,850 mg, Intravenous, 1 Day/Dose, 22 of 22 cycles Dose modification: 1,920 mg/m2 (original dose 2,400 mg/m2, Cycle 2, Reason: Provider Judgment, Comment: mucisitis) Administration: 3,850 mg  (10/21/2017), 3,100 mg (11/04/2017), 3,100 mg (11/18/2017), 3,100 mg (12/02/2017), 3,100 mg (12/16/2017), 3,100 mg (12/30/2017), 3,100 mg (01/13/2018), 3,100 mg (01/27/2018), 3,100 mg (02/10/2018), 3,100 mg (02/24/2018), 3,100 mg (03/10/2018), 3,100 mg (03/24/2018), 3,100 mg (04/28/2018), 3,100 mg (05/17/2018), 3,100 mg (05/31/2018), 3,100 mg (06/16/2018), 3,100 mg (06/30/2018), 3,100 mg (07/21/2018), 3,100 mg (08/04/2018), 3,100 mg (08/25/2018), 3,100 mg (09/08/2018) irinotecan LIPOSOME (ONIVYDE) 81.7 mg in sodium chloride 0.9 % 500 mL chemo infusion, 50 mg/m2 = 81.7 mg (100 % of original dose 50 mg/m2), Intravenous, Once, 22 of 22 cycles Dose modification: 50 mg/m2 (original dose 50 mg/m2, Cycle 1, Reason: Provider Judgment), 50 mg/m2 (original dose 50 mg/m2, Cycle 2, Reason: Provider Judgment) Administration: 81.7 mg (10/21/2017), 81.7 mg (11/04/2017), 81.7 mg (11/18/2017), 81.7 mg (12/02/2017), 81.7 mg (12/16/2017), 81.7 mg (12/30/2017), 81.7 mg (01/13/2018), 81.7 mg (01/27/2018), 81.7 mg (02/10/2018), 81.7 mg (02/24/2018), 81.7 mg (03/10/2018), 81.7 mg (03/24/2018), 81.7 mg (04/28/2018), 81.7 mg (05/17/2018), 81.7 mg (05/31/2018), 81.7 mg (06/16/2018), 81.7 mg (06/30/2018), 81.7 mg (07/21/2018), 81.7 mg (08/04/2018), 81.7 mg (08/25/2018), 81.7 mg (09/08/2018)  for chemotherapy treatment.    09/22/2018 - 01/11/2019 Chemotherapy   The patient had palonosetron (ALOXI) injection 0.25 mg, 0.25 mg, Intravenous,  Once, 7 of 8 cycles Administration: 0.25 mg (09/22/2018), 0.25 mg (10/11/2018), 0.25 mg (10/25/2018), 0.25 mg (12/01/2018), 0.25 mg (12/15/2018), 0.25 mg (11/17/2018), 0.25 mg (12/29/2018) pegfilgrastim (NEULASTA) injection 6 mg, 6 mg, Subcutaneous, Once, 5 of 5 cycles Administration: 6 mg (10/13/2018), 6 mg (10/27/2018), 6 mg (12/03/2018), 6  mg (12/17/2018), 6 mg (11/19/2018) pegfilgrastim-cbqv (UDENYCA) injection 6 mg, 6 mg, Subcutaneous, Once, 1 of 2 cycles Administration: 6 mg (12/31/2018) leucovorin 628 mg in dextrose 5 % 250 mL infusion, 400 mg/m2 =  628 mg, Intravenous,  Once, 7 of 8 cycles Administration: 628 mg (09/22/2018), 628 mg (10/11/2018), 628 mg (10/25/2018), 628 mg (12/01/2018), 628 mg (12/15/2018), 628 mg (11/17/2018), 628 mg (12/29/2018) oxaliplatin (ELOXATIN) 100 mg in dextrose 5 % 500 mL chemo infusion, 65 mg/m2 = 100 mg (100 % of original dose 65 mg/m2), Intravenous,  Once, 7 of 8 cycles Dose modification: 65 mg/m2 (original dose 65 mg/m2, Cycle 1, Reason: Provider Judgment) Administration: 100 mg (09/22/2018), 100 mg (10/11/2018), 100 mg (10/25/2018), 100 mg (12/01/2018), 100 mg (12/15/2018), 100 mg (11/17/2018), 100 mg (12/29/2018) fluorouracil (ADRUCIL) chemo injection 500 mg, 320 mg/m2 = 500 mg (100 % of original dose 320 mg/m2), Intravenous,  Once, 7 of 8 cycles Dose modification: 320 mg/m2 (original dose 320 mg/m2, Cycle 1, Reason: Provider Judgment) Administration: 500 mg (09/22/2018), 500 mg (10/11/2018), 500 mg (10/25/2018), 500 mg (12/01/2018), 500 mg (12/15/2018), 500 mg (11/17/2018), 500 mg (12/29/2018) fluorouracil (ADRUCIL) 3,000 mg in sodium chloride 0.9 % 90 mL chemo infusion, 1,920 mg/m2 = 3,000 mg (100 % of original dose 1,920 mg/m2), Intravenous, 1 Day/Dose, 7 of 8 cycles Dose modification: 1,920 mg/m2 (original dose 1,920 mg/m2, Cycle 1, Reason: Provider Judgment) Administration: 3,000 mg (09/22/2018), 3,000 mg (10/11/2018), 3,000 mg (10/25/2018), 3,000 mg (12/01/2018), 3,000 mg (12/15/2018), 3,000 mg (11/17/2018), 3,000 mg (12/29/2018)  for chemotherapy treatment.    12/24/2018 Genetic Testing   Negative genetic testing on the common hereditary cancer panel.  The Common Hereditary Gene Panel offered by [facility] includes sequencing and/or deletion duplication testing of the following 48 genes: APC, ATM, AXIN2, BARD1, BMPR1A, BRCA1, BRCA2, BRIP1, CDH1, CDK4, CDKN2A (p14ARF), CDKN2A (p16INK4a), CHEK2, CTNNA1, DICER1, EPCAM (Deletion/duplication testing only), GREM1 (promoter region deletion/duplication testing only), KIT, MEN1, MLH1, MSH2, MSH3, MSH6,  MUTYH, NBN, NF1, NHTL1, PALB2, PDGFRA, PMS2, POLD1, POLE, PTEN, RAD50, RAD51C, RAD51D, RNF43, SDHB, SDHC, SDHD, SMAD4, SMARCA4. STK11, TP53, TSC1, TSC2, and VHL.  The following genes were evaluated for sequence changes only: SDHA and HOXB13 c.251G>A variant only. The report date is December 24, 2018.  DICER1 VUS identified.  This will not change medical decision making.   02/10/2019 -  Chemotherapy   The patient had palonosetron (ALOXI) injection 0.25 mg, 0.25 mg, Intravenous,  Once, 3 of 4 cycles Administration: 0.25 mg (02/10/2019), 0.25 mg (02/24/2019), 0.25 mg (03/10/2019), 0.25 mg (03/17/2019), 0.25 mg (03/31/2019) pegfilgrastim-jmdb (FULPHILA) injection 6 mg, 6 mg, Subcutaneous,  Once, 3 of 4 cycles Administration: 6 mg (02/25/2019), 6 mg (03/18/2019) CISplatin (PLATINOL) 31 mg in sodium chloride 0.9 % 250 mL chemo infusion, 20 mg/m2 = 31 mg (80 % of original dose 25 mg/m2), Intravenous,  Once, 3 of 4 cycles Dose modification: 20 mg/m2 (80 % of original dose 25 mg/m2, Cycle 1, Reason: Other (see comments), Comment: renal dysfunction) Administration: 31 mg (02/10/2019), 31 mg (02/24/2019), 31 mg (03/10/2019), 31 mg (03/17/2019), 31 mg (03/31/2019) gemcitabine (GEMZAR) 1,140 mg in sodium chloride 0.9 % 250 mL chemo infusion, 750 mg/m2 = 1,140 mg (75 % of original dose 1,000 mg/m2), Intravenous,  Once, 3 of 4 cycles Dose modification: 750 mg/m2 (75 % of original dose 1,000 mg/m2, Cycle 1, Reason: Provider Judgment) Administration: 1,140 mg (02/10/2019), 1,140 mg (02/24/2019), 1,140 mg (03/10/2019), 1,140 mg (03/17/2019), 1,140 mg (03/31/2019) fosaprepitant (EMEND) 150 mg, dexamethasone (DECADRON) 12 mg in sodium chloride  0.9 % 145 mL IVPB, , Intravenous,  Once, 3 of 4 cycles Administration:  (02/10/2019),  (02/24/2019),  (03/10/2019),  (03/17/2019),  (03/31/2019)  for chemotherapy treatment.       CANCER STAGING: Cancer Staging Pancreatic cancer metastasized to liver [facility]) Staging form: Pancreas,  AJCC 7th Edition - Clinical: Stage IV (T3, N1, M1) - Signed by Baird Cancer, PA-C on 03/16/2014 - Pathologic: No stage assigned - Unsigned    INTERVAL HISTORY:  Ms. Kuwahara 85 y.o. female seen for follow-up of metastatic pancreatic cancer and toxicity assessment.  She is here for cycle 3-day 15.  She complained of lower rib pain which is stable from the last 2 weeks ago.  She is requiring occasional Tylenol.  Appetite and energy levels are 50%.  She is accompanied by her son.  She is happy to know that she gained couple of pounds.  She had nausea and vomiting for 1 day after last treatment.  She is taking stool softener for constipation and is managing it.  REVIEW OF SYSTEMS:  Review of Systems  Constitutional: Positive for fatigue.  Respiratory: Positive for cough.   Gastrointestinal: Positive for constipation and nausea.  Neurological: Positive for headaches and numbness.  All other systems reviewed and are negative.    PAST MEDICAL/SURGICAL HISTORY:  Past Medical History:  Diagnosis Date  . Anemia due to antineoplastic chemotherapy 09/12/2015   Started Aranesp 500 mcg on 09/12/2015  . Anxiety   . Chronic diarrhea   . Depression   . DNR (do not resuscitate) 08/16/2015  . Erosive esophagitis   . Family history of breast cancer in female   . Family history of prostate cancer   . GERD (gastroesophageal reflux disease)   . Hx of adenomatous colonic polyps    tubular adenomas, last found in 2008  . Hyperplastic colon polyp 03/19/10   tcs by Dr. Gala Romney  . Hypertension 20 years   . Kidney stone    hx/ crushed   . Opioid contract exists 04/18/2015   With Dr. Moshe Cipro  . Pancreatic cancer ([place]) 02/2014  . Pancreatic cancer metastasized to liver ([place]) 03/10/2014  . Schatzki's ring 08/26/10   Last dilated on EGD by Dr. Trevor Iha [facility], linear gastric erosions, BI hemigastrectomy   Past Surgical History:  Procedure Laterality Date  . BREAST LUMPECTOMY Left   . bunion removal     from  both feet   . CHOLECYSTECTOMY  1965   . COLONOSCOPY  03/19/2010   DR Gala Romney,, normal TI, pancolonic diverticula, random colon bx neg., hyperplastic polyps removed  . ESOPHAGOGASTRODUODENOSCOPY  11/22/2003   DR Gala Romney, erosive RE, Billroth I  . ESOPHAGOGASTRODUODENOSCOPY  08/26/10   Dr. Gala Romney- moderate severe ERE, Scahtzki ring s/p dilation, Billroth I, linear gastric erosions, bx-gastric xanthelasma  . LEFT SHOULDER SURGERY  2009   DR HARRISON  . ORIF ANKLE FRACTURE Right 05/22/2013   Procedure: OPEN REDUCTION INTERNAL FIXATION (ORIF) RIGHT ANKLE FRACTURE;  Surgeon: Sanjuana Kava, MD;  Location: [facility];  Service: Orthopedics;  Laterality: Right;  . stomach ulcer  50 years ago    had some of her stomach removed      SOCIAL HISTORY:  Social History   Socioeconomic History  . Marital status: Widowed    Spouse name: Not on file  . Number of children: 2  . Years of education: 39  . Highest education level: 12th grade  Occupational History  . Occupation: retired from Estate manager/land agent: RETIRED  Social Needs  .  Financial resource strain: Not hard at all  . Food insecurity    Worry: Never true    Inability: Never true  . Transportation needs    Medical: No    Non-medical: No  Tobacco Use  . Smoking status: Current Some Day Smoker    Packs/day: 0.50    Years: 60.00    Pack years: 30.00    Types: Cigarettes    Last attempt to quit: 09/09/2016    Years since quitting: 2.5  . Smokeless tobacco: Never Used  Substance and Sexual Activity  . Alcohol use: No  . Drug use: No  . Sexual activity: Not Currently  Lifestyle  . Physical activity    Days per week: 3 days    Minutes per session: 30 min  . Stress: Not at all  Relationships  . Social connections    Talks on phone: More than three times a week    Gets together: Three times a week    Attends religious service: 1 to 4 times per year    Active member of club or organization: No    Attends meetings of clubs or organizations:  Never    Relationship status: Widowed  . Intimate partner violence    Fear of current or ex partner: No    Emotionally abused: No    Physically abused: No    Forced sexual activity: No  Other Topics Concern  . Not on file  Social History Narrative   Lives with son alone     FAMILY HISTORY:  Family History  Problem Relation Age of Onset  . Hypertension Mother   . Kidney failure Brother        on dialysis  . Diabetes Sister   . Diabetes Sister   . Hypertension Father   . Kidney failure Sister   . Kidney Stones Brother   . Breast cancer Son 49  . Gout Son 77  . Gout Nephew 57  . Liver disease Neg Hx   . Colon cancer Neg Hx     CURRENT MEDICATIONS:  Outpatient Encounter Medications as of 04/14/2019  Medication Sig  . acetaminophen (TYLENOL) 500 MG tablet Take 500 mg by mouth every 6 (six) hours as needed for mild pain or moderate pain.   Marland Kitchen amLODipine (NORVASC) 10 MG tablet TAKE 1 TABLET DAILY  . CISPLATIN IV Inject into the vein. Days 1, 8 every 21 days  . clobetasol (TEMOVATE) 0.05 % external solution APP AA ON SCALP BID PRN NOT TO FACE GROIN OR UNDERARMS  . clonazePAM (KLONOPIN) 0.5 MG tablet Take 1 tablet (0.5 mg total) by mouth at bedtime.  . cycloSPORINE (RESTASIS) 0.05 % ophthalmic emulsion Place 1 drop into both eyes 2 (two) times daily.  . diphenhydrAMINE (BENADRYL) 50 MG tablet Take 1 tablet 1 hour prior to CT  . fluorometholone (FML) 0.1 % ophthalmic suspension INSTILL 1 DROP INTO EACH EYE THREE TIMES DAILY FOR 14 DAYS  . fluticasone (FLONASE) 50 MCG/ACT nasal spray Place 1 spray into both nostrils daily.  Marland Kitchen GEMCITABINE HCL IV Inject into the vein. Days 1, 8 every 21 days  . lidocaine (XYLOCAINE) 2 % solution RINSE WITH 5ML AS NEEDED TO RELIEVE MOUTH PAIN.  Marland Kitchen lidocaine-prilocaine (EMLA) cream Apply 1 application topically as needed. Apply to portacath site as needed  . loperamide (IMODIUM A-D) 2 MG tablet Take 2 mg by mouth 4 (four) times daily as needed for  diarrhea or loose stools.  . metoprolol tartrate (LOPRESSOR) 25 MG tablet  TAKE 1 TABLET DAILY FOR BLOOD PRESSURE (DOSE REDUCTION)  . mirtazapine (REMERON) 15 MG tablet Take 15 mg by mouth at bedtime.  Marland Kitchen MYRBETRIQ 50 MG TB24 tablet TAKE 1 TABLET DAILY  . pantoprazole (PROTONIX) 20 MG tablet TAKE 1 TABLET DAILY  . senna-docusate (SENOKOT-S) 8.6-50 MG tablet Take 1 tablet by mouth daily.  . [DISCONTINUED] prochlorperazine (COMPAZINE) 10 MG tablet Take 1 tablet (10 mg total) by mouth every 6 (six) hours as needed (Nausea or vomiting).   Facility-Administered Encounter Medications as of 04/14/2019  Medication  . [COMPLETED] 0.9 %  sodium chloride infusion  . [COMPLETED] dextrose 5 % and 0.45% NaCl 1,000 mL with potassium chloride 20 mEq, magnesium sulfate 12 mEq infusion  . heparin lock flush 100 unit/mL  . sodium chloride flush (NS) 0.9 % injection 10 mL    ALLERGIES:  Allergies  Allergen Reactions  . Iohexol Hives and Shortness Of Breath    **Patient can not have IV contrast, pt has breakthrough reaction even after 13 hour premeds** Do not give IV contrast PATIENT HAD TO BE TAKEN TO ED, C/O WHELPS, HIVES, DIFFICULTY BREATHING. PATIENT GIVEN IV CONTRAST AFTER 13 HOUR PRE MEDS ON 01/09/2017 WITH NO REACTION NOTED. On 10/19/17 patient had reaction despite premedication.    Marland Kitchen Aciphex [Rabeprazole Sodium] Other (See Comments)    unknown  . Amlodipine Besylate-Valsartan     Rash to high-dose (5/320)  . Esomeprazole Magnesium   . Omeprazole Other (See Comments)    Patient states "medication didn't work"  . Ciprofloxacin Rash  . Penicillins Swelling and Rash    Has patient had a PCN reaction causing immediate rash, facial/tongue/throat swelling, SOB or lightheadedness with hypotension: Yes Has patient had a PCN reaction causing severe rash involving mucus membranes or skin necrosis: Yes Has patient had a PCN reaction that required hospitalization: No Has patient had a PCN reaction occurring  within the last 10 years: yes If all of the above answers are "NO", then may proceed with Cephalosporin use.   [name] [Benzonatate] Itching    Full head, face, neck, and chest itching.      PHYSICAL EXAM:  ECOG Performance status: 1  Vitals:   04/14/19 0806  BP: (!) 141/47  Pulse: (!) 54  Resp: 18  Temp: 97.8 F (36.6 C)  SpO2: 100%   Filed Weights   04/14/19 0806  Weight: 116 lb 9.6 oz (52.9 kg)    Physical Exam Vitals signs reviewed.  Constitutional:      Appearance: Normal appearance.  Cardiovascular:     Rate and Rhythm: Normal rate and regular rhythm.     Heart sounds: Normal heart sounds.  Pulmonary:     Effort: Pulmonary effort is normal.     Breath sounds: Normal breath sounds.  Abdominal:     General: There is no distension.     Palpations: Abdomen is soft. There is no mass.  Musculoskeletal:        General: No swelling.  Skin:    General: Skin is warm.  Neurological:     General: No focal deficit present.     Mental Status: She is alert and oriented to person, place, and time.  Psychiatric:        Mood and Affect: Mood normal.        Behavior: Behavior normal.      LABORATORY DATA:  I have reviewed the labs as listed.  CBC    Component Value Date/Time   WBC 5.3 04/14/2019 0807  RBC 2.50 (L) 04/14/2019 0807   HGB 7.7 (L) 04/14/2019 0807   HCT 24.1 (L) 04/14/2019 0807   PLT 108 (L) 04/14/2019 0807   MCV 96.4 04/14/2019 0807   MCH 30.8 04/14/2019 0807   MCHC 32.0 04/14/2019 0807   RDW 18.7 (H) 04/14/2019 0807   LYMPHSABS 0.8 04/14/2019 0807   MONOABS 0.5 04/14/2019 0807   EOSABS 0.1 04/14/2019 0807   BASOSABS 0.0 04/14/2019 0807   CMP Latest Ref Rng & Units 04/14/2019 03/31/2019 03/17/2019  Glucose 70 - 99 mg/dL 105(H) 98 136(H)  BUN 8 - 23 mg/dL 35(H) 21 27(H)  Creatinine 0.44 - 1.00 mg/dL 1.33(H) 1.32(H) 1.24(H)  Sodium 135 - 145 mmol/L 139 139 140  Potassium 3.5 - 5.1 mmol/L 3.6 3.9 3.7  Chloride 98 - 111 mmol/L 109 107  111  CO2 22 - 32 mmol/L 20(L) 24 23  Calcium 8.9 - 10.3 mg/dL 7.9(L) 8.0(L) 8.3(L)  Total Protein 6.5 - 8.1 g/dL 6.3(L) 7.2 7.1  Total Bilirubin 0.3 - 1.2 mg/dL 0.6 0.4 0.4  Alkaline Phos 38 - 126 U/L 167(H) 192(H) 142(H)  AST 15 - 41 U/L _0 ALT 0 - 44 U/L _1 DIAGNOSTIC IMAGING:  I have independently reviewed the scans and discussed with the patient.   I have reviewed Venita Lick LPN's note and agree with the documentation.  I personally performed a face-to-face visit, made revisions and my assessment and plan is as follows.    ASSESSMENT & PLAN:   Pancreatic cancer metastasized to liver ([place]) 1.  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 - Gemcitabine and Abraxane from normal 2015 through August 2018 with progression. -Gemcitabine and Tarceva from 01/28/2017 through 10/07/2017 with progression. -21 cycles of infusional 5-FU and Onivyde from 10/21/2017-09/08/2018. -7 cycles of FOLFOX from 09/22/2018-12/29/2018. -CT CAP on 01/10/2019 showed increase in size of dominant hepatic lesion near the dome measuring 4.7 cm, previously 2.6 cm.  Other lesions in the liver are stable.  No new lesions seen.  No lesions in the lungs. - I discussed about available clinical trial at [facility] for K-ras G 12 V mutations.  She does not want to travel that far. 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  MSI high was not detected. - Cycle 1 of gemcitabine (750 mg/M square) and cisplatin (20 mg/M square) on 02/10/2019.  Day 15 dose was on 02/24/2019. -Cycle 2 of gemcitabine and cisplatin on 03/10/2019, received day 8 on 03/17/2019. -Cycle 3-day 1 of gemcitabine and cisplatin on 03/31/2019.  We switched her treatments to day 1 and day 15. -She tolerated her treatment reasonably well.  She gained 2 pounds.  She has eaten better. -She continued to have bilateral lower rib pain.  She  developed this pain 3 weeks ago when she was raking leaves in the back yard.  This is manageable with Tylenol at this time.  We will keep a close eye on it. -We reviewed her blood work.  It is adequate to proceed with cycle 3-day 15 today.  We will reevaluate her in 2 weeks. -Her creatinine is 1.3.  She will receive 1 L of fluids today and tomorrow. -Her hemoglobin is 7.7.  I will give her 1 unit of blood transfusion.  2.  Weight loss: -She has gained couple of pounds in the last 2 weeks.  3.  Genetic testing: -Germline mutation testing was negative on 12/24/2018.  Total time spent is 25 minutes with more than 50% of the time spent face-to-face discussing new treatment plan, counseling and coordination of care.    Orders placed this encounter:  Orders Placed This Encounter  Procedures  . CBC with Differential/Platelet  . Comprehensive metabolic panel  . Cancer antigen 19-9      Derek Jack, MD [facility] [phone]

## 2019-04-15 ENCOUNTER — Inpatient Hospital Stay (HOSPITAL_COMMUNITY): Payer: Medicare HMO

## 2019-04-15 VITALS — BP 154/56 | HR 59 | Temp 96.8°F | Resp 16

## 2019-04-15 DIAGNOSIS — T451X5A Adverse effect of antineoplastic and immunosuppressive drugs, initial encounter: Secondary | ICD-10-CM

## 2019-04-15 DIAGNOSIS — C252 Malignant neoplasm of tail of pancreas: Secondary | ICD-10-CM | POA: Diagnosis not present

## 2019-04-15 DIAGNOSIS — D649 Anemia, unspecified: Secondary | ICD-10-CM | POA: Diagnosis not present

## 2019-04-15 DIAGNOSIS — Z5111 Encounter for antineoplastic chemotherapy: Secondary | ICD-10-CM | POA: Diagnosis not present

## 2019-04-15 DIAGNOSIS — C259 Malignant neoplasm of pancreas, unspecified: Secondary | ICD-10-CM

## 2019-04-15 DIAGNOSIS — C787 Secondary malignant neoplasm of liver and intrahepatic bile duct: Secondary | ICD-10-CM | POA: Diagnosis not present

## 2019-04-15 DIAGNOSIS — D6481 Anemia due to antineoplastic chemotherapy: Secondary | ICD-10-CM

## 2019-04-15 LAB — CANCER ANTIGEN 19-9: CA 19-9: 238 U/mL — ABNORMAL HIGH (ref 0–35)

## 2019-04-15 MED ORDER — SODIUM CHLORIDE 0.9% FLUSH
10.0000 mL | INTRAVENOUS | Status: AC | PRN
  Administered 2019-04-15: 10 mL

## 2019-04-15 MED ORDER — HEPARIN SOD (PORK) LOCK FLUSH 100 UNIT/ML IV SOLN
500.0000 [IU] | Freq: Every day | INTRAVENOUS | Status: AC | PRN
  Administered 2019-04-15: 500 [IU]

## 2019-04-15 MED ORDER — SODIUM CHLORIDE 0.9 % IV SOLN
Freq: Once | INTRAVENOUS | Status: AC
  Administered 2019-04-15: 11:00:00 via INTRAVENOUS
  Filled 2019-04-15: qty 1000

## 2019-04-15 MED ORDER — DIPHENHYDRAMINE HCL 25 MG PO CAPS
25.0000 mg | ORAL_CAPSULE | Freq: Once | ORAL | Status: AC
  Administered 2019-04-15: 25 mg via ORAL
  Filled 2019-04-15: qty 1

## 2019-04-15 MED ORDER — ACETAMINOPHEN 325 MG PO TABS
650.0000 mg | ORAL_TABLET | Freq: Once | ORAL | Status: AC
  Administered 2019-04-15: 650 mg via ORAL
  Filled 2019-04-15: qty 2

## 2019-04-15 MED ORDER — SODIUM CHLORIDE 0.9% IV SOLUTION
250.0000 mL | Freq: Once | INTRAVENOUS | Status: AC
  Administered 2019-04-15: 250 mL via INTRAVENOUS

## 2019-04-15 NOTE — Progress Notes (Signed)
One unit of blood today and hydration fluids per orders.  Patient tolerated it well without problems. Vitals stable and discharged home from clinic ambulatory. Follow up as scheduled.

## 2019-04-16 LAB — TYPE AND SCREEN
ABO/RH(D): O POS
Antibody Screen: NEGATIVE
Unit division: 0

## 2019-04-16 LAB — BPAM RBC
Blood Product Expiration Date: 202012292359
ISSUE DATE / TIME: 202012040906
Unit Type and Rh: 5100

## 2019-04-28 ENCOUNTER — Inpatient Hospital Stay (HOSPITAL_BASED_OUTPATIENT_CLINIC_OR_DEPARTMENT_OTHER): Payer: Medicare HMO | Admitting: Hematology

## 2019-04-28 ENCOUNTER — Encounter (HOSPITAL_COMMUNITY): Payer: Self-pay | Admitting: Hematology

## 2019-04-28 ENCOUNTER — Inpatient Hospital Stay (HOSPITAL_COMMUNITY): Payer: Medicare HMO

## 2019-04-28 ENCOUNTER — Other Ambulatory Visit: Payer: Self-pay

## 2019-04-28 VITALS — BP 152/54 | HR 58 | Temp 97.4°F | Resp 16 | Wt 115.0 lb

## 2019-04-28 DIAGNOSIS — C259 Malignant neoplasm of pancreas, unspecified: Secondary | ICD-10-CM

## 2019-04-28 DIAGNOSIS — D6481 Anemia due to antineoplastic chemotherapy: Secondary | ICD-10-CM

## 2019-04-28 DIAGNOSIS — D649 Anemia, unspecified: Secondary | ICD-10-CM | POA: Diagnosis not present

## 2019-04-28 DIAGNOSIS — C252 Malignant neoplasm of tail of pancreas: Secondary | ICD-10-CM | POA: Diagnosis not present

## 2019-04-28 DIAGNOSIS — Z5111 Encounter for antineoplastic chemotherapy: Secondary | ICD-10-CM | POA: Diagnosis not present

## 2019-04-28 DIAGNOSIS — C787 Secondary malignant neoplasm of liver and intrahepatic bile duct: Secondary | ICD-10-CM

## 2019-04-28 LAB — COMPREHENSIVE METABOLIC PANEL
ALT: 11 U/L (ref 0–44)
AST: 14 U/L — ABNORMAL LOW (ref 15–41)
Albumin: 3.5 g/dL (ref 3.5–5.0)
Alkaline Phosphatase: 194 U/L — ABNORMAL HIGH (ref 38–126)
Anion gap: 9 (ref 5–15)
BUN: 29 mg/dL — ABNORMAL HIGH (ref 8–23)
CO2: 25 mmol/L (ref 22–32)
Calcium: 8.1 mg/dL — ABNORMAL LOW (ref 8.9–10.3)
Chloride: 107 mmol/L (ref 98–111)
Creatinine, Ser: 1.29 mg/dL — ABNORMAL HIGH (ref 0.44–1.00)
GFR calc Af Amer: 43 mL/min — ABNORMAL LOW (ref >60)
GFR calc non Af Amer: 37 mL/min — ABNORMAL LOW (ref >60)
Glucose, Bld: 99 mg/dL (ref 70–99)
Potassium: 4.1 mmol/L (ref 3.5–5.1)
Sodium: 141 mmol/L (ref 135–145)
Total Bilirubin: 0.6 mg/dL (ref 0.3–1.2)
Total Protein: 7 g/dL (ref 6.5–8.1)

## 2019-04-28 LAB — CBC WITH DIFFERENTIAL/PLATELET
Abs Immature Granulocytes: 0.02 10*3/uL (ref 0.00–0.07)
Basophils Absolute: 0 10*3/uL (ref 0.0–0.1)
Basophils Relative: 1 %
Eosinophils Absolute: 0.2 10*3/uL (ref 0.0–0.5)
Eosinophils Relative: 3 %
HCT: 29.4 % — ABNORMAL LOW (ref 36.0–46.0)
Hemoglobin: 9.5 g/dL — ABNORMAL LOW (ref 12.0–15.0)
Immature Granulocytes: 0 %
Lymphocytes Relative: 20 %
Lymphs Abs: 1.2 10*3/uL (ref 0.7–4.0)
MCH: 30.9 pg (ref 26.0–34.0)
MCHC: 32.3 g/dL (ref 30.0–36.0)
MCV: 95.8 fL (ref 80.0–100.0)
Monocytes Absolute: 0.5 10*3/uL (ref 0.1–1.0)
Monocytes Relative: 8 %
Neutro Abs: 4.3 10*3/uL (ref 1.7–7.7)
Neutrophils Relative %: 68 %
Platelets: 71 10*3/uL — ABNORMAL LOW (ref 150–400)
RBC: 3.07 MIL/uL — ABNORMAL LOW (ref 3.87–5.11)
RDW: 16.5 % — ABNORMAL HIGH (ref 11.5–15.5)
WBC: 6.3 10*3/uL (ref 4.0–10.5)
nRBC: 0 % (ref 0.0–0.2)

## 2019-04-28 MED ORDER — SODIUM CHLORIDE 0.9% FLUSH
10.0000 mL | Freq: Once | INTRAVENOUS | Status: AC | PRN
  Administered 2019-04-28: 10 mL

## 2019-04-28 MED ORDER — SODIUM CHLORIDE 0.9 % IV SOLN
Freq: Once | INTRAVENOUS | Status: AC
  Filled 2019-04-28: qty 1000

## 2019-04-28 MED ORDER — HEPARIN SOD (PORK) LOCK FLUSH 100 UNIT/ML IV SOLN
500.0000 [IU] | Freq: Once | INTRAVENOUS | Status: AC | PRN
  Administered 2019-04-28: 500 [IU]

## 2019-04-28 NOTE — Progress Notes (Signed)
[place], [place] [address]   CLINIC:  Medical Oncology/Hematology  PCP:  Fayrene Helper, MD [address] [place] [address] [phone]   REASON FOR VISIT:  Follow-up for metastatic pancreatic cancer to the liver   BRIEF ONCOLOGIC HISTORY:  Oncology History  Pancreatic cancer metastasized to liver ([place])  03/10/2014 Initial Diagnosis   Pancreatic cancer metastasized to liver   03/22/2014 - 03/05/2016 Chemotherapy   Abraxane/Gemzar days 1, 8, every 28 days.  Day 15 was cancelled due to leukopenia and thrombocytopenia on day 15 cycle 1.   05/24/2014 Treatment Plan Change   Day 8 of cycle 3 is held with ANC of 1.1   06/05/2014 Imaging   CT C/A/P . Interval decrease in size of the pancreatic tail mass.Improved hepatic metastatic disease. No new lesions. No CT findings for metastatic disease involving the chest.   08/09/2014 Tumor Marker   CA 19-9= 33 (WNL)   10/26/2014 Imaging   MRI- Continued interval decrease in size of the hepatic metastatic lesions and no new lesions are identified. Continued decrease in size of the pancreatic tail lesion.   01/03/2015 Tumor Marker   Results for CAMRYN, LAMPSON (MRN [id number]) as of 01/18/2015 12:52  01/03/2015 10:00 CA 19-9: 14    01/17/2015 Imaging   MRI- Response to therapy of hepatic metastasis.  Similar size of a pancreatic tail lesion.   03/20/2015 Imaging   MRI-L spine- Severe disc space narrowing at L2-L3, with endplate reactive changes. Large disc extrusion into the ventral epidural space,central to the RIGHT with a cephalad migrated free fragment. Additional Large disc extrusion into the retroperitone...   03/22/2015 Imaging   CT pelvis- No evidence for metastatic disease within the pelvis.   07/24/2015 Imaging   MRI abd- Continued response to therapy, with no residual detectable liver metastases. No new sites of metastatic disease in the abdomen.    08/15/2015 Code Status     She confirms desire for DNR status.   12/21/2015 Imaging   MRI liver- Severe image degradation due to motion artifact reducing diagnostic sensitivity and specificity. Reduce conspicuity of the pancreatic tail lesion suggesting further improvement. The original liver lesions have resolved.   03/05/2016 Treatment Plan Change   Chemotherapy holiday/Break after 2 years worth of treatment   04/07/2016 Imaging   CT chest- When compared to recent chest CT, new minimally displaced anterior left sixth rib fracture. Slight increase in subcarinal adenopathy   04/28/2016 Imaging   Bone density- BMD as determined from Femur Neck Right is 0.705 g/cm2 with a T-Score of -2.4. This patient is considered osteopenic according to [facility]) criteria. Compared with the prior study on 02/23/2013, the BMD of the lumbar spine/rt. femoral neck show a statistically significant decrease.    05/23/2016 Imaging   MRI abd- 1. Exam is significantly degraded by patient respiratory motion. Consider follow-up exams with CT abdomen with without contrast per pancreatic protocol. 2. Fullness in tail of pancreas is more prominent and potentially increased in size. Recommend close attention on follow-up. (Consider CT as above) 3. No explanation for back pain.   09/11/2016 Imaging   MRI pancreas- Interval progression of pancreatic tail lesion. New differential perfusion right liver with areas of heterogeneity. Underlying metastatic disease in this region not excluded.   09/11/2016 Progression   MRi in conjunction with rising CA 19-9 are indicative of relapse of disease.   01/09/2017 Progression   CT C/A/P: 2.1 x 2.9  cm lesion in the pancreatic tail and abutting the splenic hilum, corresponding to known primary pancreatic neoplasm, mildly increased.  Scattered small hypoenhancing lesions in the liver measuring up to 10 mm, approximately 8-10 in number, suspicious for hepatic metastases.  No  findings specific for metastatic disease in the chest.  Additional ancillary findings as above.    01/27/2017 - 10/20/2017 Chemotherapy   The patient had gemcitabine (GEMZAR) 1,292 mg in sodium chloride 0.9 % 250 mL chemo infusion, 800 mg/m2 = 1,292 mg, Intravenous,  Once, 9 of 12 cycles Administration: 1,292 mg (01/28/2017), 1,292 mg (02/11/2017), 1,292 mg (02/25/2017), 1,292 mg (03/11/2017), 1,292 mg (03/25/2017), 1,292 mg (04/08/2017), 1,292 mg (04/22/2017), 1,292 mg (04/29/2017), 1,292 mg (05/20/2017), 1,292 mg (06/03/2017), 1,292 mg (06/24/2017), 1,292 mg (07/08/2017), 1,292 mg (07/29/2017), 1,292 mg (08/12/2017), 1,292 mg (08/26/2017), 1,292 mg (09/09/2017), 1,292 mg (09/23/2017), 1,292 mg (10/07/2017)  for chemotherapy treatment.    10/21/2017 - 09/22/2018 Chemotherapy   The patient had leucovorin 700 mg in dextrose 5 % 250 mL infusion, 644 mg, Intravenous,  Once, 22 of 22 cycles Administration: 700 mg (10/21/2017), 700 mg (11/04/2017), 700 mg (11/18/2017), 700 mg (12/02/2017), 700 mg (12/16/2017), 700 mg (12/30/2017), 700 mg (01/13/2018), 700 mg (01/27/2018), 700 mg (02/10/2018), 700 mg (02/24/2018), 700 mg (03/10/2018), 700 mg (03/24/2018), 700 mg (04/28/2018), 700 mg (05/17/2018), 700 mg (05/31/2018), 700 mg (06/16/2018), 700 mg (06/30/2018), 700 mg (07/21/2018), 700 mg (08/04/2018), 700 mg (08/25/2018), 700 mg (09/08/2018) ondansetron (ZOFRAN) 8 mg in sodium chloride 0.9 % 50 mL IVPB, , Intravenous,  Once, 21 of 21 cycles Administration:  (11/04/2017),  (01/27/2018),  (02/10/2018),  (02/24/2018),  (03/10/2018),  (03/24/2018),  (04/28/2018),  (05/17/2018),  (05/31/2018),  (06/16/2018),  (06/30/2018),  (07/21/2018),  (08/04/2018),  (08/25/2018),  (09/08/2018) fluorouracil (ADRUCIL) 3,850 mg in sodium chloride 0.9 % 73 mL chemo infusion, 2,400 mg/m2 = 3,850 mg, Intravenous, 1 Day/Dose, 22 of 22 cycles Dose modification: 1,920 mg/m2 (original dose 2,400 mg/m2, Cycle 2, Reason: Provider Judgment, Comment: mucisitis) Administration: 3,850 mg  (10/21/2017), 3,100 mg (11/04/2017), 3,100 mg (11/18/2017), 3,100 mg (12/02/2017), 3,100 mg (12/16/2017), 3,100 mg (12/30/2017), 3,100 mg (01/13/2018), 3,100 mg (01/27/2018), 3,100 mg (02/10/2018), 3,100 mg (02/24/2018), 3,100 mg (03/10/2018), 3,100 mg (03/24/2018), 3,100 mg (04/28/2018), 3,100 mg (05/17/2018), 3,100 mg (05/31/2018), 3,100 mg (06/16/2018), 3,100 mg (06/30/2018), 3,100 mg (07/21/2018), 3,100 mg (08/04/2018), 3,100 mg (08/25/2018), 3,100 mg (09/08/2018) irinotecan LIPOSOME (ONIVYDE) 81.7 mg in sodium chloride 0.9 % 500 mL chemo infusion, 50 mg/m2 = 81.7 mg (100 % of original dose 50 mg/m2), Intravenous, Once, 22 of 22 cycles Dose modification: 50 mg/m2 (original dose 50 mg/m2, Cycle 1, Reason: Provider Judgment), 50 mg/m2 (original dose 50 mg/m2, Cycle 2, Reason: Provider Judgment) Administration: 81.7 mg (10/21/2017), 81.7 mg (11/04/2017), 81.7 mg (11/18/2017), 81.7 mg (12/02/2017), 81.7 mg (12/16/2017), 81.7 mg (12/30/2017), 81.7 mg (01/13/2018), 81.7 mg (01/27/2018), 81.7 mg (02/10/2018), 81.7 mg (02/24/2018), 81.7 mg (03/10/2018), 81.7 mg (03/24/2018), 81.7 mg (04/28/2018), 81.7 mg (05/17/2018), 81.7 mg (05/31/2018), 81.7 mg (06/16/2018), 81.7 mg (06/30/2018), 81.7 mg (07/21/2018), 81.7 mg (08/04/2018), 81.7 mg (08/25/2018), 81.7 mg (09/08/2018)  for chemotherapy treatment.    09/22/2018 - 01/11/2019 Chemotherapy   The patient had palonosetron (ALOXI) injection 0.25 mg, 0.25 mg, Intravenous,  Once, 7 of 8 cycles Administration: 0.25 mg (09/22/2018), 0.25 mg (10/11/2018), 0.25 mg (10/25/2018), 0.25 mg (12/01/2018), 0.25 mg (12/15/2018), 0.25 mg (11/17/2018), 0.25 mg (12/29/2018) pegfilgrastim (NEULASTA) injection 6 mg, 6 mg, Subcutaneous, Once, 5 of 5 cycles Administration: 6 mg (10/13/2018), 6 mg (10/27/2018), 6 mg (12/03/2018), 6  mg (12/17/2018), 6 mg (11/19/2018) pegfilgrastim-cbqv (UDENYCA) injection 6 mg, 6 mg, Subcutaneous, Once, 1 of 2 cycles Administration: 6 mg (12/31/2018) leucovorin 628 mg in dextrose 5 % 250 mL infusion, 400 mg/m2 =  628 mg, Intravenous,  Once, 7 of 8 cycles Administration: 628 mg (09/22/2018), 628 mg (10/11/2018), 628 mg (10/25/2018), 628 mg (12/01/2018), 628 mg (12/15/2018), 628 mg (11/17/2018), 628 mg (12/29/2018) oxaliplatin (ELOXATIN) 100 mg in dextrose 5 % 500 mL chemo infusion, 65 mg/m2 = 100 mg (100 % of original dose 65 mg/m2), Intravenous,  Once, 7 of 8 cycles Dose modification: 65 mg/m2 (original dose 65 mg/m2, Cycle 1, Reason: Provider Judgment) Administration: 100 mg (09/22/2018), 100 mg (10/11/2018), 100 mg (10/25/2018), 100 mg (12/01/2018), 100 mg (12/15/2018), 100 mg (11/17/2018), 100 mg (12/29/2018) fluorouracil (ADRUCIL) chemo injection 500 mg, 320 mg/m2 = 500 mg (100 % of original dose 320 mg/m2), Intravenous,  Once, 7 of 8 cycles Dose modification: 320 mg/m2 (original dose 320 mg/m2, Cycle 1, Reason: Provider Judgment) Administration: 500 mg (09/22/2018), 500 mg (10/11/2018), 500 mg (10/25/2018), 500 mg (12/01/2018), 500 mg (12/15/2018), 500 mg (11/17/2018), 500 mg (12/29/2018) fluorouracil (ADRUCIL) 3,000 mg in sodium chloride 0.9 % 90 mL chemo infusion, 1,920 mg/m2 = 3,000 mg (100 % of original dose 1,920 mg/m2), Intravenous, 1 Day/Dose, 7 of 8 cycles Dose modification: 1,920 mg/m2 (original dose 1,920 mg/m2, Cycle 1, Reason: Provider Judgment) Administration: 3,000 mg (09/22/2018), 3,000 mg (10/11/2018), 3,000 mg (10/25/2018), 3,000 mg (12/01/2018), 3,000 mg (12/15/2018), 3,000 mg (11/17/2018), 3,000 mg (12/29/2018)  for chemotherapy treatment.    12/24/2018 Genetic Testing   Negative genetic testing on the common hereditary cancer panel.  The Common Hereditary Gene Panel offered by [facility] includes sequencing and/or deletion duplication testing of the following 48 genes: APC, ATM, AXIN2, BARD1, BMPR1A, BRCA1, BRCA2, BRIP1, CDH1, CDK4, CDKN2A (p14ARF), CDKN2A (p16INK4a), CHEK2, CTNNA1, DICER1, EPCAM (Deletion/duplication testing only), GREM1 (promoter region deletion/duplication testing only), KIT, MEN1, MLH1, MSH2, MSH3, MSH6,  MUTYH, NBN, NF1, NHTL1, PALB2, PDGFRA, PMS2, POLD1, POLE, PTEN, RAD50, RAD51C, RAD51D, RNF43, SDHB, SDHC, SDHD, SMAD4, SMARCA4. STK11, TP53, TSC1, TSC2, and VHL.  The following genes were evaluated for sequence changes only: SDHA and HOXB13 c.251G>A variant only. The report date is December 24, 2018.  DICER1 VUS identified.  This will not change medical decision making.   02/10/2019 -  Chemotherapy   The patient had palonosetron (ALOXI) injection 0.25 mg, 0.25 mg, Intravenous,  Once, 3 of 4 cycles Administration: 0.25 mg (02/10/2019), 0.25 mg (02/24/2019), 0.25 mg (03/10/2019), 0.25 mg (03/17/2019), 0.25 mg (03/31/2019), 0.25 mg (04/14/2019) pegfilgrastim-jmdb (FULPHILA) injection 6 mg, 6 mg, Subcutaneous,  Once, 3 of 4 cycles Administration: 6 mg (02/25/2019), 6 mg (03/18/2019) CISplatin (PLATINOL) 31 mg in sodium chloride 0.9 % 250 mL chemo infusion, 20 mg/m2 = 31 mg (80 % of original dose 25 mg/m2), Intravenous,  Once, 3 of 4 cycles Dose modification: 20 mg/m2 (80 % of original dose 25 mg/m2, Cycle 1, Reason: Other (see comments), Comment: renal dysfunction) Administration: 31 mg (02/10/2019), 31 mg (02/24/2019), 31 mg (03/10/2019), 31 mg (03/17/2019), 31 mg (03/31/2019), 31 mg (04/14/2019) gemcitabine (GEMZAR) 1,140 mg in sodium chloride 0.9 % 250 mL chemo infusion, 750 mg/m2 = 1,140 mg (75 % of original dose 1,000 mg/m2), Intravenous,  Once, 3 of 4 cycles Dose modification: 750 mg/m2 (75 % of original dose 1,000 mg/m2, Cycle 1, Reason: Provider Judgment) Administration: 1,140 mg (02/10/2019), 1,140 mg (02/24/2019), 1,140 mg (03/10/2019), 1,140 mg (03/17/2019), 1,140 mg (03/31/2019), 1,140 mg (04/14/2019) fosaprepitant (EMEND)  150 mg, dexamethasone (DECADRON) 12 mg in sodium chloride 0.9 % 145 mL IVPB, , Intravenous,  Once, 3 of 4 cycles Administration:  (02/10/2019),  (02/24/2019),  (03/10/2019),  (03/17/2019),  (03/31/2019),  (04/14/2019)  for chemotherapy treatment.       CANCER STAGING: Cancer Staging  Pancreatic cancer metastasized to liver [facility]) Staging form: Pancreas, AJCC 7th Edition - Clinical: Stage IV (T3, N1, M1) - Signed by Baird Cancer, PA-C on 03/16/2014 - Pathologic: No stage assigned - Unsigned    INTERVAL HISTORY:  Ms. Creps 85 y.o. female seen for follow-up of metastatic pancreatic cancer and toxicity assessment prior to cycle 4 of chemotherapy.  Appetite is reported as 75%.  Energy levels are 50%.  Reported some lightheadedness after receiving blood transfusion last week.  She had diarrhea for 1 day which resolved with Imodium.  Denies any fevers or chills.  Denies any tingling or numbness in extremities.  No ringing in the ears.  REVIEW OF SYSTEMS:  Review of Systems  Gastrointestinal: Positive for diarrhea.  All other systems reviewed and are negative.    PAST MEDICAL/SURGICAL HISTORY:  Past Medical History:  Diagnosis Date  . Anemia due to antineoplastic chemotherapy 09/12/2015   Started Aranesp 500 mcg on 09/12/2015  . Anxiety   . Chronic diarrhea   . Depression   . DNR (do not resuscitate) 08/16/2015  . Erosive esophagitis   . Family history of breast cancer in female   . Family history of prostate cancer   . GERD (gastroesophageal reflux disease)   . Hx of adenomatous colonic polyps    tubular adenomas, last found in 2008  . Hyperplastic colon polyp 03/19/10   tcs by Dr. Gala Romney  . Hypertension 20 years   . Kidney stone    hx/ crushed   . Opioid contract exists 04/18/2015   With Dr. Moshe Cipro  . Pancreatic cancer ([place]) 02/2014  . Pancreatic cancer metastasized to liver ([place]) 03/10/2014  . Schatzki's ring 08/26/10   Last dilated on EGD by Dr. Trevor Iha [facility], linear gastric erosions, BI hemigastrectomy   Past Surgical History:  Procedure Laterality Date  . BREAST LUMPECTOMY Left   . bunion removal     from both feet   . CHOLECYSTECTOMY  1965   . COLONOSCOPY  03/19/2010   DR Gala Romney,, normal TI, pancolonic diverticula, random colon bx neg., hyperplastic  polyps removed  . ESOPHAGOGASTRODUODENOSCOPY  11/22/2003   DR Gala Romney, erosive RE, Billroth I  . ESOPHAGOGASTRODUODENOSCOPY  08/26/10   Dr. Gala Romney- moderate severe ERE, Scahtzki ring s/p dilation, Billroth I, linear gastric erosions, bx-gastric xanthelasma  . LEFT SHOULDER SURGERY  2009   DR HARRISON  . ORIF ANKLE FRACTURE Right 05/22/2013   Procedure: OPEN REDUCTION INTERNAL FIXATION (ORIF) RIGHT ANKLE FRACTURE;  Surgeon: Sanjuana Kava, MD;  Location: [facility];  Service: Orthopedics;  Laterality: Right;  . stomach ulcer  50 years ago    had some of her stomach removed      SOCIAL HISTORY:  Social History   Socioeconomic History  . Marital status: Widowed    Spouse name: Not on file  . Number of children: 2  . Years of education: 34  . Highest education level: 12th grade  Occupational History  . Occupation: retired from Estate manager/land agent: RETIRED  Tobacco Use  . Smoking status: Current Some Day Smoker    Packs/day: 0.50    Years: 60.00    Pack years: 30.00    Types: Cigarettes  Last attempt to quit: 09/09/2016    Years since quitting: 2.6  . Smokeless tobacco: Never Used  Substance and Sexual Activity  . Alcohol use: No  . Drug use: No  . Sexual activity: Not Currently  Other Topics Concern  . Not on file  Social History Narrative   Lives with son alone    Social Determinants of Health   Financial Resource Strain:   . Difficulty of Paying Living Expenses: Not on file  Food Insecurity:   . Worried About Charity fundraiser in the Last Year: Not on file  . Ran Out of Food in the Last Year: Not on file  Transportation Needs:   . Lack of Transportation (Medical): Not on file  . Lack of Transportation (Non-Medical): Not on file  Physical Activity:   . Days of Exercise per Week: Not on file  . Minutes of Exercise per Session: Not on file  Stress:   . Feeling of Stress : Not on file  Social Connections:   . Frequency of Communication with Friends and Family: Not on file   . Frequency of Social Gatherings with Friends and Family: Not on file  . Attends Religious Services: Not on file  . Active Member of Clubs or Organizations: Not on file  . Attends Archivist Meetings: Not on file  . Marital Status: Not on file  Intimate Partner Violence:   . Fear of Current or Ex-Partner: Not on file  . Emotionally Abused: Not on file  . Physically Abused: Not on file  . Sexually Abused: Not on file    FAMILY HISTORY:  Family History  Problem Relation Age of Onset  . Hypertension Mother   . Kidney failure Brother        on dialysis  . Diabetes Sister   . Diabetes Sister   . Hypertension Father   . Kidney failure Sister   . Kidney Stones Brother   . Breast cancer Son 35  . Gout Son 68  . Gout Nephew 57  . Liver disease Neg Hx   . Colon cancer Neg Hx     CURRENT MEDICATIONS:  Outpatient Encounter Medications as of 04/28/2019  Medication Sig  . amLODipine (NORVASC) 10 MG tablet TAKE 1 TABLET DAILY  . CISPLATIN IV Inject into the vein. Days 1, 8 every 21 days  . clonazePAM (KLONOPIN) 0.5 MG tablet Take 1 tablet (0.5 mg total) by mouth at bedtime.  . cycloSPORINE (RESTASIS) 0.05 % ophthalmic emulsion Place 1 drop into both eyes 2 (two) times daily.  . fluorometholone (FML) 0.1 % ophthalmic suspension INSTILL 1 DROP INTO EACH EYE THREE TIMES DAILY FOR 14 DAYS  . GEMCITABINE HCL IV Inject into the vein. Days 1, 8 every 21 days  . metoprolol tartrate (LOPRESSOR) 25 MG tablet TAKE 1 TABLET DAILY FOR BLOOD PRESSURE (DOSE REDUCTION)  . mirtazapine (REMERON) 15 MG tablet Take 15 mg by mouth at bedtime.  Marland Kitchen MYRBETRIQ 50 MG TB24 tablet TAKE 1 TABLET DAILY  . pantoprazole (PROTONIX) 20 MG tablet TAKE 1 TABLET DAILY  . senna-docusate (SENOKOT-S) 8.6-50 MG tablet Take 1 tablet by mouth daily.  Marland Kitchen acetaminophen (TYLENOL) 500 MG tablet Take 500 mg by mouth every 6 (six) hours as needed for mild pain or moderate pain.   . clobetasol (TEMOVATE) 0.05 % external  solution APP AA ON SCALP BID PRN NOT TO FACE GROIN OR UNDERARMS  . diphenhydrAMINE (BENADRYL) 50 MG tablet Take 1 tablet 1 hour prior to CT (  Patient not taking: Reported on 04/28/2019)  . fluticasone (FLONASE) 50 MCG/ACT nasal spray Place 1 spray into both nostrils daily.  Marland Kitchen lidocaine (XYLOCAINE) 2 % solution RINSE WITH 5ML AS NEEDED TO RELIEVE MOUTH PAIN.  Marland Kitchen lidocaine-prilocaine (EMLA) cream Apply 1 application topically as needed. Apply to portacath site as needed (Patient not taking: Reported on 04/28/2019)  . loperamide (IMODIUM A-D) 2 MG tablet Take 2 mg by mouth 4 (four) times daily as needed for diarrhea or loose stools.  . [DISCONTINUED] prochlorperazine (COMPAZINE) 10 MG tablet Take 1 tablet (10 mg total) by mouth every 6 (six) hours as needed (Nausea or vomiting).   No facility-administered encounter medications on file as of 04/28/2019.    ALLERGIES:  Allergies  Allergen Reactions  . Iohexol Hives and Shortness Of Breath    **Patient can not have IV contrast, pt has breakthrough reaction even after 13 hour premeds** Do not give IV contrast PATIENT HAD TO BE TAKEN TO ED, C/O WHELPS, HIVES, DIFFICULTY BREATHING. PATIENT GIVEN IV CONTRAST AFTER 13 HOUR PRE MEDS ON 01/09/2017 WITH NO REACTION NOTED. On 10/19/17 patient had reaction despite premedication.    Marland Kitchen Aciphex [Rabeprazole Sodium] Other (See Comments)    unknown  . Amlodipine Besylate-Valsartan     Rash to high-dose (5/320)  . Esomeprazole Magnesium   . Omeprazole Other (See Comments)    Patient states "medication didn't work"  . Ciprofloxacin Rash  . Penicillins Swelling and Rash    Has patient had a PCN reaction causing immediate rash, facial/tongue/throat swelling, SOB or lightheadedness with hypotension: Yes Has patient had a PCN reaction causing severe rash involving mucus membranes or skin necrosis: Yes Has patient had a PCN reaction that required hospitalization: No Has patient had a PCN reaction occurring within the  last 10 years: yes If all of the above answers are "NO", then may proceed with Cephalosporin use.   [name] [Benzonatate] Itching    Full head, face, neck, and chest itching.      PHYSICAL EXAM:  ECOG Performance status: 1  Vitals:   04/28/19 0800  BP: (!) 152/54  Pulse: (!) 58  Resp: 16  Temp: (!) 97.4 F (36.3 C)  SpO2: 100%   Filed Weights   04/28/19 0800  Weight: 115 lb (52.2 kg)    Physical Exam Vitals reviewed.  Constitutional:      Appearance: Normal appearance.  Cardiovascular:     Rate and Rhythm: Normal rate and regular rhythm.     Heart sounds: Normal heart sounds.  Pulmonary:     Effort: Pulmonary effort is normal.     Breath sounds: Normal breath sounds.  Abdominal:     General: There is no distension.     Palpations: Abdomen is soft. There is no mass.  Musculoskeletal:        General: No swelling.  Skin:    General: Skin is warm.  Neurological:     General: No focal deficit present.     Mental Status: She is alert and oriented to person, place, and time.  Psychiatric:        Mood and Affect: Mood normal.        Behavior: Behavior normal.      LABORATORY DATA:  I have reviewed the labs as listed.  CBC    Component Value Date/Time   WBC 6.3 04/28/2019 0800   RBC 3.07 (L) 04/28/2019 0800   HGB 9.5 (L) 04/28/2019 0800   HCT 29.4 (L) 04/28/2019 0800  PLT 71 (L) 04/28/2019 0800   MCV 95.8 04/28/2019 0800   MCH 30.9 04/28/2019 0800   MCHC 32.3 04/28/2019 0800   RDW 16.5 (H) 04/28/2019 0800   LYMPHSABS 1.2 04/28/2019 0800   MONOABS 0.5 04/28/2019 0800   EOSABS 0.2 04/28/2019 0800   BASOSABS 0.0 04/28/2019 0800   CMP Latest Ref Rng & Units 04/28/2019 04/14/2019 03/31/2019  Glucose 70 - 99 mg/dL 99 105(H) 98  BUN 8 - 23 mg/dL 29(H) 35(H) 21  Creatinine 0.44 - 1.00 mg/dL 1.29(H) 1.33(H) 1.32(H)  Sodium 135 - 145 mmol/L 141 139 139  Potassium 3.5 - 5.1 mmol/L 4.1 3.6 3.9  Chloride 98 - 111 mmol/L 107 109 107  CO2 22 - 32 mmol/L  25 20(L) 24  Calcium 8.9 - 10.3 mg/dL 8.1(L) 7.9(L) 8.0(L)  Total Protein 6.5 - 8.1 g/dL 7.0 6.3(L) 7.2  Total Bilirubin 0.3 - 1.2 mg/dL 0.6 0.6 0.4  Alkaline Phos 38 - 126 U/L 194(H) 167(H) 192(H)  AST 15 - 41 U/L 14(L) 15 15  ALT 0 - 44 U/L _0 DIAGNOSTIC IMAGING:  I have independently reviewed the scans and discussed with the patient.   I have reviewed Venita Lick LPN's note and agree with the documentation.  I personally performed a face-to-face visit, made revisions and my assessment and plan is as follows.    ASSESSMENT & PLAN:   Pancreatic cancer metastasized to liver ([place]) 1.  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 - Gemcitabine and Abraxane from normal 2015 through August 2018 with progression. -Gemcitabine and Tarceva from 01/28/2017 through 10/07/2017 with progression. -21 cycles of infusional 5-FU and Onivyde from 10/21/2017-09/08/2018. -7 cycles of FOLFOX from 09/22/2018-12/29/2018. -CT CAP on 01/10/2019 showed increase in size of dominant hepatic lesion near the dome measuring 4.7 cm, previously 2.6 cm.  Other lesions in the liver are stable.  No new lesions seen.  No lesions in the lungs. 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  MSI high was not detected. -3 cycles of gemcitabine (750 mg/m2) and cisplatin (20 mg/m2) day 1 and day 15 from 02/10/2019 through 03/31/2019. -1 unit PRBC on 04/14/2019 for hemoglobin 7.7.  She felt dizzy after transfusion. -She reported improvement in the back pain.  She is eating 3 meals per day. -We reviewed her labs.  Platelet count is 71.  I will hold his treatment today. -We will see her back in 2 weeks for labs and start of cycle 2.  Treatment can be resumed if platelet count more than 100 and ANC more than 1500.  I plan to repeat scans after cycle 4.  2.  Weight loss: -She is eating 3 meals per day.  She has not gotten the  Ensure clear yet. -She lost about 1 pound since last visit.  She was told to start Ensure clear as soon as she gets them.  3.  Genetic testing: -Germline mutation testing was negative on 12/24/2018.     Orders placed this encounter:  Orders Placed This Encounter  Procedures  . CBC with Differential/Platelet  . Comprehensive metabolic panel  . Lactate dehydrogenase  . Magnesium      Derek Jack, MD [place] [phone]

## 2019-04-28 NOTE — Assessment & Plan Note (Addendum)
1.  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 - Gemcitabine and Abraxane from normal 2015 through August 2018 with progression. -Gemcitabine and Tarceva from 01/28/2017 through 10/07/2017 with progression. -21 cycles of infusional 5-FU and Onivyde from 10/21/2017-09/08/2018. -7 cycles of FOLFOX from 09/22/2018-12/29/2018. -CT CAP on 01/10/2019 showed increase in size of dominant hepatic lesion near the dome measuring 4.7 cm, previously 2.6 cm.  Other lesions in the liver are stable.  No new lesions seen.  No lesions in the lungs. 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  MSI high was not detected. -3 cycles of gemcitabine (750 mg/m2) and cisplatin (20 mg/m2) day 1 and day 15 from 02/10/2019 through 03/31/2019. -1 unit PRBC on 04/14/2019 for hemoglobin 7.7.  She felt dizzy after transfusion. -She reported improvement in the back pain.  She is eating 3 meals per day. -We reviewed her labs.  Platelet count is 71.  I will hold his treatment today. -We will see her back in 2 weeks for labs and start of cycle 2.  Treatment can be resumed if platelet count more than 100 and ANC more than 1500.  I plan to repeat scans after cycle 4.  2.  Weight loss: -She is eating 3 meals per day.  She has not gotten the Ensure clear yet. -She lost about 1 pound since last visit.  She was told to start Ensure clear as soon as she gets them.  3.  Genetic testing: -Germline mutation testing was negative on 12/24/2018.

## 2019-04-28 NOTE — Patient Instructions (Addendum)
[place] at [facility] Discharge Instructions  You were seen today by Dr. Delton Coombes. He went over your recent lab results. He is going to hold your treatment today and give you fluids. He will see you back in 2 weeks for labs, treatment and follow up.   Thank you for choosing [place] at [facility] to provide your oncology and hematology care.  To afford each patient quality time with our provider, please arrive at least 15 minutes before your scheduled appointment time.   If you have a lab appointment with the [place] please come in thru the  Main Entrance and check in at the main information desk  You need to re-schedule your appointment should you arrive 10 or more minutes late.  We strive to give you quality time with our providers, and arriving late affects you and other patients whose appointments are after yours.  Also, if you no show three or more times for appointments you may be dismissed from the clinic at the providers discretion.     Again, thank you for choosing [facility].  Our hope is that these requests will decrease the amount of time that you wait before being seen by our physicians.       _____________________________________________________________  Should you have questions after your visit to [facility], please contact our office at [phone] between the hours of 8:00 a.m. and 4:30 p.m.  Voicemails left after 4:00 p.m. will not be returned until the following business day.  For prescription refill requests, have your pharmacy contact our office and allow 72 hours.    Cancer Center Support Programs:   > Cancer Support Group  2nd Tuesday of the month 1pm-2pm, Journey Room

## 2019-04-28 NOTE — Progress Notes (Signed)
Serum Creatinine 1.29 and Platelets 71 today.  Dr. Delton Coombes made aware.    Holding treatment today and hydration only verbal order Dr. Delton Coombes.   Patient tolerated hydration with no complaints voiced.  Port site clean and dry with good blood return noted before and after hydration.  No bruising or swelling noted with port.  Band aid applied.  VSS with discharge and left ambulatory with no s/s of distress noted.

## 2019-04-29 ENCOUNTER — Ambulatory Visit: Payer: Medicare HMO

## 2019-05-03 LAB — CANCER ANTIGEN 19-9: CA 19-9: 282 U/mL — ABNORMAL HIGH (ref 0–35)

## 2019-05-12 ENCOUNTER — Inpatient Hospital Stay (HOSPITAL_COMMUNITY): Payer: Medicare HMO

## 2019-05-12 ENCOUNTER — Other Ambulatory Visit: Payer: Self-pay

## 2019-05-12 ENCOUNTER — Inpatient Hospital Stay (HOSPITAL_BASED_OUTPATIENT_CLINIC_OR_DEPARTMENT_OTHER): Payer: Medicare HMO | Admitting: Nurse Practitioner

## 2019-05-12 ENCOUNTER — Encounter (HOSPITAL_COMMUNITY): Payer: Self-pay

## 2019-05-12 VITALS — BP 160/57 | HR 57 | Temp 97.6°F | Resp 18

## 2019-05-12 DIAGNOSIS — Z5111 Encounter for antineoplastic chemotherapy: Secondary | ICD-10-CM | POA: Diagnosis not present

## 2019-05-12 DIAGNOSIS — C787 Secondary malignant neoplasm of liver and intrahepatic bile duct: Secondary | ICD-10-CM

## 2019-05-12 DIAGNOSIS — C259 Malignant neoplasm of pancreas, unspecified: Secondary | ICD-10-CM | POA: Diagnosis not present

## 2019-05-12 DIAGNOSIS — D649 Anemia, unspecified: Secondary | ICD-10-CM | POA: Diagnosis not present

## 2019-05-12 DIAGNOSIS — C252 Malignant neoplasm of tail of pancreas: Secondary | ICD-10-CM | POA: Diagnosis not present

## 2019-05-12 LAB — COMPREHENSIVE METABOLIC PANEL
ALT: 16 U/L (ref 0–44)
AST: 29 U/L (ref 15–41)
Albumin: 3.4 g/dL — ABNORMAL LOW (ref 3.5–5.0)
Alkaline Phosphatase: 193 U/L — ABNORMAL HIGH (ref 38–126)
Anion gap: 10 (ref 5–15)
BUN: 31 mg/dL — ABNORMAL HIGH (ref 8–23)
CO2: 24 mmol/L (ref 22–32)
Calcium: 8.3 mg/dL — ABNORMAL LOW (ref 8.9–10.3)
Chloride: 105 mmol/L (ref 98–111)
Creatinine, Ser: 1.42 mg/dL — ABNORMAL HIGH (ref 0.44–1.00)
GFR calc Af Amer: 38 mL/min — ABNORMAL LOW (ref >60)
GFR calc non Af Amer: 33 mL/min — ABNORMAL LOW (ref >60)
Glucose, Bld: 93 mg/dL (ref 70–99)
Potassium: 3.9 mmol/L (ref 3.5–5.1)
Sodium: 139 mmol/L (ref 135–145)
Total Bilirubin: 0.7 mg/dL (ref 0.3–1.2)
Total Protein: 6.9 g/dL (ref 6.5–8.1)

## 2019-05-12 LAB — CBC WITH DIFFERENTIAL/PLATELET
Abs Immature Granulocytes: 0.01 10*3/uL (ref 0.00–0.07)
Basophils Absolute: 0 10*3/uL (ref 0.0–0.1)
Basophils Relative: 0 %
Eosinophils Absolute: 0.2 10*3/uL (ref 0.0–0.5)
Eosinophils Relative: 5 %
HCT: 29 % — ABNORMAL LOW (ref 36.0–46.0)
Hemoglobin: 9.7 g/dL — ABNORMAL LOW (ref 12.0–15.0)
Immature Granulocytes: 0 %
Lymphocytes Relative: 21 %
Lymphs Abs: 1.1 10*3/uL (ref 0.7–4.0)
MCH: 31.8 pg (ref 26.0–34.0)
MCHC: 33.4 g/dL (ref 30.0–36.0)
MCV: 95.1 fL (ref 80.0–100.0)
Monocytes Absolute: 0.5 10*3/uL (ref 0.1–1.0)
Monocytes Relative: 9 %
Neutro Abs: 3.4 10*3/uL (ref 1.7–7.7)
Neutrophils Relative %: 65 %
Platelets: 139 10*3/uL — ABNORMAL LOW (ref 150–400)
RBC: 3.05 MIL/uL — ABNORMAL LOW (ref 3.87–5.11)
RDW: 17 % — ABNORMAL HIGH (ref 11.5–15.5)
WBC: 5.2 10*3/uL (ref 4.0–10.5)
nRBC: 0 % (ref 0.0–0.2)

## 2019-05-12 LAB — MAGNESIUM: Magnesium: 1.8 mg/dL (ref 1.7–2.4)

## 2019-05-12 LAB — LACTATE DEHYDROGENASE: LDH: 146 U/L (ref 98–192)

## 2019-05-12 MED ORDER — SODIUM CHLORIDE 0.9 % IV SOLN
Freq: Once | INTRAVENOUS | Status: AC
  Filled 2019-05-12: qty 5

## 2019-05-12 MED ORDER — POTASSIUM CHLORIDE 2 MEQ/ML IV SOLN
Freq: Once | INTRAVENOUS | Status: AC
  Filled 2019-05-12: qty 10

## 2019-05-12 MED ORDER — SODIUM CHLORIDE 0.9 % IV SOLN: Freq: Once | INTRAVENOUS | Status: AC

## 2019-05-12 MED ORDER — PALONOSETRON HCL INJECTION 0.25 MG/5ML
0.2500 mg | Freq: Once | INTRAVENOUS | Status: AC
  Administered 2019-05-12: 0.25 mg via INTRAVENOUS

## 2019-05-12 MED ORDER — PALONOSETRON HCL INJECTION 0.25 MG/5ML
INTRAVENOUS | Status: AC
  Filled 2019-05-12: qty 5

## 2019-05-12 MED ORDER — HEPARIN SOD (PORK) LOCK FLUSH 100 UNIT/ML IV SOLN: 250.0000 [IU] | Freq: Once | INTRAVENOUS | Status: DC | PRN

## 2019-05-12 MED ORDER — SODIUM CHLORIDE 0.9% FLUSH
10.0000 mL | INTRAVENOUS | Status: DC | PRN
  Administered 2019-05-12: 10 mL

## 2019-05-12 MED ORDER — HEPARIN SOD (PORK) LOCK FLUSH 100 UNIT/ML IV SOLN
500.0000 [IU] | Freq: Once | INTRAVENOUS | Status: AC | PRN
  Administered 2019-05-12: 500 [IU]

## 2019-05-12 MED ORDER — SODIUM CHLORIDE 0.9 % IV SOLN: 20.0000 mg/m2 | Freq: Once | INTRAVENOUS | Status: DC

## 2019-05-12 MED ORDER — SODIUM CHLORIDE 0.9 % IV SOLN
750.0000 mg/m2 | Freq: Once | INTRAVENOUS | Status: AC
  Administered 2019-05-12: 1140 mg via INTRAVENOUS
  Filled 2019-05-12: qty 26.3

## 2019-05-12 NOTE — Patient Instructions (Signed)
[facility] at [facility] Discharge Instructions  Follow up in 1 week with labs and treatment.   Thank you for choosing [facility] at [facility] to provide your oncology and hematology care.  To afford each patient quality time with our provider, please arrive at least 15 minutes before your scheduled appointment time.   If you have a lab appointment with the Cancer Center please come in thru the Main Entrance and check in at the main information desk.  You need to re-schedule your appointment should you arrive 10 or more minutes late.  We strive to give you quality time with our providers, and arriving late affects you and other patients whose appointments are after yours.  Also, if you no show three or more times for appointments you may be dismissed from the clinic at the providers discretion.     Again, thank you for choosing [facility].  Our hope is that these requests will decrease the amount of time that you wait before being seen by our physicians.       _____________________________________________________________  Should you have questions after your visit to [facility], please contact our office at [phone] between the hours of 8:00 a.m. and 4:30 p.m.  Voicemails left after 4:00 p.m. will not be returned until the following business day.  For prescription refill requests, have your pharmacy contact our office and allow 72 hours.    Due to Covid, you will need to wear a mask upon entering the hospital. If you do not have a mask, a mask will be given to you at the Main Entrance upon arrival. For doctor visits, patients may have 1 support person with them. For treatment visits, patients can not have anyone with them due to social distancing guidelines and our immunocompromised population.      

## 2019-05-12 NOTE — Patient Instructions (Signed)
[facility] at [facility] Discharge Instructions  Labs drawn from portacath today   Thank you for choosing [facility] at [facility] to provide your oncology and hematology care.  To afford each patient quality time with our provider, please arrive at least 15 minutes before your scheduled appointment time.   If you have a lab appointment with the Cancer Center please come in thru the Main Entrance and check in at the main information desk.  You need to re-schedule your appointment should you arrive 10 or more minutes late.  We strive to give you quality time with our providers, and arriving late affects you and other patients whose appointments are after yours.  Also, if you no show three or more times for appointments you may be dismissed from the clinic at the providers discretion.     Again, thank you for choosing [facility].  Our hope is that these requests will decrease the amount of time that you wait before being seen by our physicians.       _____________________________________________________________  Should you have questions after your visit to  Cancer Center, please contact our office at [phone] between the hours of 8:00 a.m. and 4:30 p.m.  Voicemails left after 4:00 p.m. will not be returned until the following business day.  For prescription refill requests, have your pharmacy contact our office and allow 72 hours.    Due to Covid, you will need to wear a mask upon entering the hospital. If you do not have a mask, a mask will be given to you at the Main Entrance upon arrival. For doctor visits, patients may have 1 support person with them. For treatment visits, patients can not have anyone with them due to social distancing guidelines and our immunocompromised population.     

## 2019-05-12 NOTE — Assessment & Plan Note (Signed)
1.  Metastatic pancreatic cancer to the liver: -Foundation 1 testing shows MS stable, TMB cannot be determined, K-ras G12V, TP 53 mutation. -Gemcitabine and Abraxane from 2015 through August 2018 with progression. -Gemcitabine and Tarceva from 01/28/2017-10/07/2017 with progression. -21 cycles of infusional 5-FU and Onivyde from 10/21/2017-09/08/2018. -7 cycles of FOLFOX from 09/22/2018-12/29/2018. -CT CAP on 01/10/2019 showed increase in size of dominant hepatic lesions near the dome measuring 4.7 cm, previously 2.6 cm.  Other lesions in the liver are stable.  No new lesions seen.  No lesions in the lungs. -Guardian 360 showed K-ras G 12 V mutation.  MSI high was not detected. -3 cycles of gemcitabine, 750 mg/m) and cisplatin (20 mg/m) day 1 and day 15 from 02/10/2019-03/31/2019. -1 unit PRBC on 04/14/2019 for hemoglobin 7.7.  She felt dizzy after the transfusion. -She reported improvement in the back pain.  She is eating 3 meals per day. -Her last treatment was held due to her platelets being 71. -Labs on 05/12/2019 showed hemoglobin 9.7, Platelets 139, ANC 3400.  Her creatinine has increased to 1.42. -We will hold the cisplatin today and give the gemcitabine. -It is plan to repeat scans after her fourth cycle. -She will follow-up in 1 week with labs.  2.  Weight loss: -She is eating 3 meals per day. -She has gained 2 pound since her last visit.

## 2019-05-12 NOTE — Progress Notes (Signed)
[name], [place] [address]   CLINIC:  Medical Oncology/Hematology  PCP:  Fayrene Helper, MD [address] [place] [address] [phone]   REASON FOR VISIT: Follow-up for pancreatic cancer  CURRENT THERAPY: Gemcitabine and cisplatin  BRIEF ONCOLOGIC HISTORY:  Oncology History  Pancreatic cancer metastasized to liver ([place])  03/10/2014 Initial Diagnosis   Pancreatic cancer metastasized to liver   03/22/2014 - 03/05/2016 Chemotherapy   Abraxane/Gemzar days 1, 8, every 28 days.  Day 15 was cancelled due to leukopenia and thrombocytopenia on day 15 cycle 1.   05/24/2014 Treatment Plan Change   Day 8 of cycle 3 is held with ANC of 1.1   06/05/2014 Imaging   CT C/A/P . Interval decrease in size of the pancreatic tail mass.Improved hepatic metastatic disease. No new lesions. No CT findings for metastatic disease involving the chest.   08/09/2014 Tumor Marker   CA 19-9= 33 (WNL)   10/26/2014 Imaging   MRI- Continued interval decrease in size of the hepatic metastatic lesions and no new lesions are identified. Continued decrease in size of the pancreatic tail lesion.   01/03/2015 Tumor Marker   Results for OLINDA, NOLA (MRN [id number]) as of 01/18/2015 12:52  01/03/2015 10:00 CA 19-9: 14    01/17/2015 Imaging   MRI- Response to therapy of hepatic metastasis.  Similar size of a pancreatic tail lesion.   03/20/2015 Imaging   MRI-L spine- Severe disc space narrowing at L2-L3, with endplate reactive changes. Large disc extrusion into the ventral epidural space,central to the RIGHT with a cephalad migrated free fragment. Additional Large disc extrusion into the retroperitone...   03/22/2015 Imaging   CT pelvis- No evidence for metastatic disease within the pelvis.   07/24/2015 Imaging   MRI abd- Continued response to therapy, with no residual detectable liver metastases. No new sites of metastatic disease in the abdomen.      08/15/2015 Code Status    She confirms desire for DNR status.   12/21/2015 Imaging   MRI liver- Severe image degradation due to motion artifact reducing diagnostic sensitivity and specificity. Reduce conspicuity of the pancreatic tail lesion suggesting further improvement. The original liver lesions have resolved.   03/05/2016 Treatment Plan Change   Chemotherapy holiday/Break after 2 years worth of treatment   04/07/2016 Imaging   CT chest- When compared to recent chest CT, new minimally displaced anterior left sixth rib fracture. Slight increase in subcarinal adenopathy   04/28/2016 Imaging   Bone density- BMD as determined from Femur Neck Right is 0.705 g/cm2 with a T-Score of -2.4. This patient is considered osteopenic according to [facility]) criteria. Compared with the prior study on 02/23/2013, the BMD of the lumbar spine/rt. femoral neck show a statistically significant decrease.    05/23/2016 Imaging   MRI abd- 1. Exam is significantly degraded by patient respiratory motion. Consider follow-up exams with CT abdomen with without contrast per pancreatic protocol. 2. Fullness in tail of pancreas is more prominent and potentially increased in size. Recommend close attention on follow-up. (Consider CT as above) 3. No explanation for back pain.   09/11/2016 Imaging   MRI pancreas- Interval progression of pancreatic tail lesion. New differential perfusion right liver with areas of heterogeneity. Underlying metastatic disease in this region not excluded.   09/11/2016 Progression   MRi in conjunction with rising CA 19-9 are indicative of relapse of disease.   01/09/2017 Progression   CT C/A/P: 2.1 x  2.9 cm lesion in the pancreatic tail and abutting the splenic hilum, corresponding to known primary pancreatic neoplasm, mildly increased.  Scattered small hypoenhancing lesions in the liver measuring up to 10 mm, approximately 8-10 in number, suspicious for  hepatic metastases.  No findings specific for metastatic disease in the chest.  Additional ancillary findings as above.    01/27/2017 - 10/20/2017 Chemotherapy   The patient had gemcitabine (GEMZAR) 1,292 mg in sodium chloride 0.9 % 250 mL chemo infusion, 800 mg/m2 = 1,292 mg, Intravenous,  Once, 9 of 12 cycles Administration: 1,292 mg (01/28/2017), 1,292 mg (02/11/2017), 1,292 mg (02/25/2017), 1,292 mg (03/11/2017), 1,292 mg (03/25/2017), 1,292 mg (04/08/2017), 1,292 mg (04/22/2017), 1,292 mg (04/29/2017), 1,292 mg (05/20/2017), 1,292 mg (06/03/2017), 1,292 mg (06/24/2017), 1,292 mg (07/08/2017), 1,292 mg (07/29/2017), 1,292 mg (08/12/2017), 1,292 mg (08/26/2017), 1,292 mg (09/09/2017), 1,292 mg (09/23/2017), 1,292 mg (10/07/2017)  for chemotherapy treatment.    10/21/2017 - 09/22/2018 Chemotherapy   The patient had leucovorin 700 mg in dextrose 5 % 250 mL infusion, 644 mg, Intravenous,  Once, 22 of 22 cycles Administration: 700 mg (10/21/2017), 700 mg (11/04/2017), 700 mg (11/18/2017), 700 mg (12/02/2017), 700 mg (12/16/2017), 700 mg (12/30/2017), 700 mg (01/13/2018), 700 mg (01/27/2018), 700 mg (02/10/2018), 700 mg (02/24/2018), 700 mg (03/10/2018), 700 mg (03/24/2018), 700 mg (04/28/2018), 700 mg (05/17/2018), 700 mg (05/31/2018), 700 mg (06/16/2018), 700 mg (06/30/2018), 700 mg (07/21/2018), 700 mg (08/04/2018), 700 mg (08/25/2018), 700 mg (09/08/2018) ondansetron (ZOFRAN) 8 mg in sodium chloride 0.9 % 50 mL IVPB, , Intravenous,  Once, 21 of 21 cycles Administration:  (11/04/2017),  (01/27/2018),  (02/10/2018),  (02/24/2018),  (03/10/2018),  (03/24/2018),  (04/28/2018),  (05/17/2018),  (05/31/2018),  (06/16/2018),  (06/30/2018),  (07/21/2018),  (08/04/2018),  (08/25/2018),  (09/08/2018) fluorouracil (ADRUCIL) 3,850 mg in sodium chloride 0.9 % 73 mL chemo infusion, 2,400 mg/m2 = 3,850 mg, Intravenous, 1 Day/Dose, 22 of 22 cycles Dose modification: 1,920 mg/m2 (original dose 2,400 mg/m2, Cycle 2, Reason: Provider Judgment, Comment:  mucisitis) Administration: 3,850 mg (10/21/2017), 3,100 mg (11/04/2017), 3,100 mg (11/18/2017), 3,100 mg (12/02/2017), 3,100 mg (12/16/2017), 3,100 mg (12/30/2017), 3,100 mg (01/13/2018), 3,100 mg (01/27/2018), 3,100 mg (02/10/2018), 3,100 mg (02/24/2018), 3,100 mg (03/10/2018), 3,100 mg (03/24/2018), 3,100 mg (04/28/2018), 3,100 mg (05/17/2018), 3,100 mg (05/31/2018), 3,100 mg (06/16/2018), 3,100 mg (06/30/2018), 3,100 mg (07/21/2018), 3,100 mg (08/04/2018), 3,100 mg (08/25/2018), 3,100 mg (09/08/2018) irinotecan LIPOSOME (ONIVYDE) 81.7 mg in sodium chloride 0.9 % 500 mL chemo infusion, 50 mg/m2 = 81.7 mg (100 % of original dose 50 mg/m2), Intravenous, Once, 22 of 22 cycles Dose modification: 50 mg/m2 (original dose 50 mg/m2, Cycle 1, Reason: Provider Judgment), 50 mg/m2 (original dose 50 mg/m2, Cycle 2, Reason: Provider Judgment) Administration: 81.7 mg (10/21/2017), 81.7 mg (11/04/2017), 81.7 mg (11/18/2017), 81.7 mg (12/02/2017), 81.7 mg (12/16/2017), 81.7 mg (12/30/2017), 81.7 mg (01/13/2018), 81.7 mg (01/27/2018), 81.7 mg (02/10/2018), 81.7 mg (02/24/2018), 81.7 mg (03/10/2018), 81.7 mg (03/24/2018), 81.7 mg (04/28/2018), 81.7 mg (05/17/2018), 81.7 mg (05/31/2018), 81.7 mg (06/16/2018), 81.7 mg (06/30/2018), 81.7 mg (07/21/2018), 81.7 mg (08/04/2018), 81.7 mg (08/25/2018), 81.7 mg (09/08/2018)  for chemotherapy treatment.    09/22/2018 - 01/11/2019 Chemotherapy   The patient had palonosetron (ALOXI) injection 0.25 mg, 0.25 mg, Intravenous,  Once, 7 of 8 cycles Administration: 0.25 mg (09/22/2018), 0.25 mg (10/11/2018), 0.25 mg (10/25/2018), 0.25 mg (12/01/2018), 0.25 mg (12/15/2018), 0.25 mg (11/17/2018), 0.25 mg (12/29/2018) pegfilgrastim (NEULASTA) injection 6 mg, 6 mg, Subcutaneous, Once, 5 of 5 cycles Administration: 6 mg (10/13/2018), 6 mg (10/27/2018), 6 mg (12/03/2018),  6 mg (12/17/2018), 6 mg (11/19/2018) pegfilgrastim-cbqv (UDENYCA) injection 6 mg, 6 mg, Subcutaneous, Once, 1 of 2 cycles Administration: 6 mg (12/31/2018) leucovorin 628 mg in  dextrose 5 % 250 mL infusion, 400 mg/m2 = 628 mg, Intravenous,  Once, 7 of 8 cycles Administration: 628 mg (09/22/2018), 628 mg (10/11/2018), 628 mg (10/25/2018), 628 mg (12/01/2018), 628 mg (12/15/2018), 628 mg (11/17/2018), 628 mg (12/29/2018) oxaliplatin (ELOXATIN) 100 mg in dextrose 5 % 500 mL chemo infusion, 65 mg/m2 = 100 mg (100 % of original dose 65 mg/m2), Intravenous,  Once, 7 of 8 cycles Dose modification: 65 mg/m2 (original dose 65 mg/m2, Cycle 1, Reason: Provider Judgment) Administration: 100 mg (09/22/2018), 100 mg (10/11/2018), 100 mg (10/25/2018), 100 mg (12/01/2018), 100 mg (12/15/2018), 100 mg (11/17/2018), 100 mg (12/29/2018) fluorouracil (ADRUCIL) chemo injection 500 mg, 320 mg/m2 = 500 mg (100 % of original dose 320 mg/m2), Intravenous,  Once, 7 of 8 cycles Dose modification: 320 mg/m2 (original dose 320 mg/m2, Cycle 1, Reason: Provider Judgment) Administration: 500 mg (09/22/2018), 500 mg (10/11/2018), 500 mg (10/25/2018), 500 mg (12/01/2018), 500 mg (12/15/2018), 500 mg (11/17/2018), 500 mg (12/29/2018) fluorouracil (ADRUCIL) 3,000 mg in sodium chloride 0.9 % 90 mL chemo infusion, 1,920 mg/m2 = 3,000 mg (100 % of original dose 1,920 mg/m2), Intravenous, 1 Day/Dose, 7 of 8 cycles Dose modification: 1,920 mg/m2 (original dose 1,920 mg/m2, Cycle 1, Reason: Provider Judgment) Administration: 3,000 mg (09/22/2018), 3,000 mg (10/11/2018), 3,000 mg (10/25/2018), 3,000 mg (12/01/2018), 3,000 mg (12/15/2018), 3,000 mg (11/17/2018), 3,000 mg (12/29/2018)  for chemotherapy treatment.    12/24/2018 Genetic Testing   Negative genetic testing on the common hereditary cancer panel.  The Common Hereditary Gene Panel offered by [facility] includes sequencing and/or deletion duplication testing of the following 48 genes: APC, ATM, AXIN2, BARD1, BMPR1A, BRCA1, BRCA2, BRIP1, CDH1, CDK4, CDKN2A (p14ARF), CDKN2A (p16INK4a), CHEK2, CTNNA1, DICER1, EPCAM (Deletion/duplication testing only), GREM1 (promoter region deletion/duplication testing  only), KIT, MEN1, MLH1, MSH2, MSH3, MSH6, MUTYH, NBN, NF1, NHTL1, PALB2, PDGFRA, PMS2, POLD1, POLE, PTEN, RAD50, RAD51C, RAD51D, RNF43, SDHB, SDHC, SDHD, SMAD4, SMARCA4. STK11, TP53, TSC1, TSC2, and VHL.  The following genes were evaluated for sequence changes only: SDHA and HOXB13 c.251G>A variant only. The report date is December 24, 2018.  DICER1 VUS identified.  This will not change medical decision making.   02/10/2019 -  Chemotherapy   The patient had palonosetron (ALOXI) injection 0.25 mg, 0.25 mg, Intravenous,  Once, 4 of 4 cycles Administration: 0.25 mg (02/10/2019), 0.25 mg (02/24/2019), 0.25 mg (03/10/2019), 0.25 mg (03/17/2019), 0.25 mg (03/31/2019), 0.25 mg (04/14/2019) pegfilgrastim-jmdb (FULPHILA) injection 6 mg, 6 mg, Subcutaneous,  Once, 4 of 4 cycles Administration: 6 mg (02/25/2019), 6 mg (03/18/2019) CISplatin (PLATINOL) 31 mg in sodium chloride 0.9 % 250 mL chemo infusion, 20 mg/m2 = 31 mg (80 % of original dose 25 mg/m2), Intravenous,  Once, 4 of 4 cycles Dose modification: 20 mg/m2 (80 % of original dose 25 mg/m2, Cycle 1, Reason: Other (see comments), Comment: renal dysfunction) Administration: 31 mg (02/10/2019), 31 mg (02/24/2019), 31 mg (03/10/2019), 31 mg (03/17/2019), 31 mg (03/31/2019), 31 mg (04/14/2019) gemcitabine (GEMZAR) 1,140 mg in sodium chloride 0.9 % 250 mL chemo infusion, 750 mg/m2 = 1,140 mg (75 % of original dose 1,000 mg/m2), Intravenous,  Once, 4 of 4 cycles Dose modification: 750 mg/m2 (75 % of original dose 1,000 mg/m2, Cycle 1, Reason: Provider Judgment) Administration: 1,140 mg (02/10/2019), 1,140 mg (02/24/2019), 1,140 mg (03/10/2019), 1,140 mg (03/17/2019), 1,140 mg (03/31/2019), 1,140 mg (04/14/2019) fosaprepitant (  EMEND) 150 mg, dexamethasone (DECADRON) 12 mg in sodium chloride 0.9 % 145 mL IVPB, , Intravenous,  Once, 4 of 4 cycles Administration:  (02/10/2019),  (02/24/2019),  (03/10/2019),  (03/17/2019),  (03/31/2019),  (04/14/2019)  for chemotherapy treatment.        CANCER STAGING: Cancer Staging Pancreatic cancer metastasized to liver [facility]) Staging form: Pancreas, AJCC 7th Edition - Clinical: Stage IV (T3, N1, M1) - Signed by Baird Cancer, PA-C on 03/16/2014 - Pathologic: No stage assigned - Unsigned    INTERVAL HISTORY:  Ms. Stetzel 85 y.o. female returns for routine follow-up for pancreatic cancer.  Patient reports she has been doing well since her last visit.  She does still have fatigue and occasional headaches and occasional diarrhea.  She does have a dry cough that she has had it is not new. Denies any nausea and vomiting. Denies any new pains. Had not noticed any recent bleeding such as epistaxis, hematuria or hematochezia. Denies recent chest pain on exertion, shortness of breath on minimal exertion, pre-syncopal episodes, or palpitations. Denies any numbness or tingling in hands or feet. Denies any recent fevers, infections, or recent hospitalizations. Patient reports appetite at 75% and energy level at 75%.  She is eating well and maintaining her weight at this time.    REVIEW OF SYSTEMS:  Review of Systems  Constitutional: Positive for fatigue.  Respiratory: Positive for cough.   Cardiovascular: Positive for leg swelling.  Gastrointestinal: Positive for diarrhea.  Neurological: Positive for headaches.  All other systems reviewed and are negative.    PAST MEDICAL/SURGICAL HISTORY:  Past Medical History:  Diagnosis Date  . Anemia due to antineoplastic chemotherapy 09/12/2015   Started Aranesp 500 mcg on 09/12/2015  . Anxiety   . Chronic diarrhea   . Depression   . DNR (do not resuscitate) 08/16/2015  . Erosive esophagitis   . Family history of breast cancer in female   . Family history of prostate cancer   . GERD (gastroesophageal reflux disease)   . Hx of adenomatous colonic polyps    tubular adenomas, last found in 2008  . Hyperplastic colon polyp 03/19/10   tcs by Dr. Gala Romney  . Hypertension 20 years   . Kidney stone     hx/ crushed   . Opioid contract exists 04/18/2015   With Dr. Moshe Cipro  . Pancreatic cancer ([place]) 02/2014  . Pancreatic cancer metastasized to liver ([place]) 03/10/2014  . Schatzki's ring 08/26/10   Last dilated on EGD by Dr. Trevor Iha [facility], linear gastric erosions, BI hemigastrectomy   Past Surgical History:  Procedure Laterality Date  . BREAST LUMPECTOMY Left   . bunion removal     from both feet   . CHOLECYSTECTOMY  1965   . COLONOSCOPY  03/19/2010   DR Gala Romney,, normal TI, pancolonic diverticula, random colon bx neg., hyperplastic polyps removed  . ESOPHAGOGASTRODUODENOSCOPY  11/22/2003   DR Gala Romney, erosive RE, Billroth I  . ESOPHAGOGASTRODUODENOSCOPY  08/26/10   Dr. Gala Romney- moderate severe ERE, Scahtzki ring s/p dilation, Billroth I, linear gastric erosions, bx-gastric xanthelasma  . LEFT SHOULDER SURGERY  2009   DR HARRISON  . ORIF ANKLE FRACTURE Right 05/22/2013   Procedure: OPEN REDUCTION INTERNAL FIXATION (ORIF) RIGHT ANKLE FRACTURE;  Surgeon: Sanjuana Kava, MD;  Location: [facility];  Service: Orthopedics;  Laterality: Right;  . stomach ulcer  50 years ago    had some of her stomach removed      SOCIAL HISTORY:  Social History   Socioeconomic History  .  Marital status: Widowed    Spouse name: Not on file  . Number of children: 2  . Years of education: 72  . Highest education level: 12th grade  Occupational History  . Occupation: retired from Estate manager/land agent: RETIRED  Tobacco Use  . Smoking status: Current Some Day Smoker    Packs/day: 0.50    Years: 60.00    Pack years: 30.00    Types: Cigarettes    Last attempt to quit: 09/09/2016    Years since quitting: 2.6  . Smokeless tobacco: Never Used  Substance and Sexual Activity  . Alcohol use: No  . Drug use: No  . Sexual activity: Not Currently  Other Topics Concern  . Not on file  Social History Narrative   Lives with son alone    Social Determinants of Health   Financial Resource Strain:   . Difficulty of  Paying Living Expenses: Not on file  Food Insecurity:   . Worried About Charity fundraiser in the Last Year: Not on file  . Ran Out of Food in the Last Year: Not on file  Transportation Needs:   . Lack of Transportation (Medical): Not on file  . Lack of Transportation (Non-Medical): Not on file  Physical Activity:   . Days of Exercise per Week: Not on file  . Minutes of Exercise per Session: Not on file  Stress:   . Feeling of Stress : Not on file  Social Connections:   . Frequency of Communication with Friends and Family: Not on file  . Frequency of Social Gatherings with Friends and Family: Not on file  . Attends Religious Services: Not on file  . Active Member of Clubs or Organizations: Not on file  . Attends Archivist Meetings: Not on file  . Marital Status: Not on file  Intimate Partner Violence:   . Fear of Current or Ex-Partner: Not on file  . Emotionally Abused: Not on file  . Physically Abused: Not on file  . Sexually Abused: Not on file    FAMILY HISTORY:  Family History  Problem Relation Age of Onset  . Hypertension Mother   . Kidney failure Brother        on dialysis  . Diabetes Sister   . Diabetes Sister   . Hypertension Father   . Kidney failure Sister   . Kidney Stones Brother   . Breast cancer Son 45  . Gout Son 40  . Gout Nephew 57  . Liver disease Neg Hx   . Colon cancer Neg Hx     CURRENT MEDICATIONS:  Outpatient Encounter Medications as of 05/12/2019  Medication Sig  . acetaminophen (TYLENOL) 500 MG tablet Take 500 mg by mouth every 6 (six) hours as needed for mild pain or moderate pain.   Marland Kitchen amLODipine (NORVASC) 10 MG tablet TAKE 1 TABLET DAILY  . CISPLATIN IV Inject into the vein. Days 1, 8 every 21 days  . clobetasol (TEMOVATE) 0.05 % external solution APP AA ON SCALP BID PRN NOT TO FACE GROIN OR UNDERARMS  . clonazePAM (KLONOPIN) 0.5 MG tablet Take 1 tablet (0.5 mg total) by mouth at bedtime.  . cycloSPORINE (RESTASIS) 0.05 %  ophthalmic emulsion Place 1 drop into both eyes 2 (two) times daily.  . diphenhydrAMINE (BENADRYL) 50 MG tablet Take 1 tablet 1 hour prior to CT (Patient not taking: Reported on 04/28/2019)  . fluorometholone (FML) 0.1 % ophthalmic suspension INSTILL 1 DROP INTO EACH EYE THREE TIMES  DAILY FOR 14 DAYS  . fluticasone (FLONASE) 50 MCG/ACT nasal spray Place 1 spray into both nostrils daily.  Marland Kitchen GEMCITABINE HCL IV Inject into the vein. Days 1, 8 every 21 days  . lidocaine (XYLOCAINE) 2 % solution RINSE WITH 5ML AS NEEDED TO RELIEVE MOUTH PAIN.  Marland Kitchen lidocaine-prilocaine (EMLA) cream Apply 1 application topically as needed. Apply to portacath site as needed (Patient not taking: Reported on 04/28/2019)  . loperamide (IMODIUM A-D) 2 MG tablet Take 2 mg by mouth 4 (four) times daily as needed for diarrhea or loose stools.  . metoprolol tartrate (LOPRESSOR) 25 MG tablet TAKE 1 TABLET DAILY FOR BLOOD PRESSURE (DOSE REDUCTION)  . mirtazapine (REMERON) 15 MG tablet Take 15 mg by mouth at bedtime.  Marland Kitchen MYRBETRIQ 50 MG TB24 tablet TAKE 1 TABLET DAILY  . pantoprazole (PROTONIX) 20 MG tablet TAKE 1 TABLET DAILY  . senna-docusate (SENOKOT-S) 8.6-50 MG tablet Take 1 tablet by mouth daily.  . [DISCONTINUED] prochlorperazine (COMPAZINE) 10 MG tablet Take 1 tablet (10 mg total) by mouth every 6 (six) hours as needed (Nausea or vomiting).   Facility-Administered Encounter Medications as of 05/12/2019  Medication Note  . [COMPLETED] 0.9 %  sodium chloride infusion   . [COMPLETED] dextrose 5 % and 0.45% NaCl 1,000 mL with potassium chloride 20 mEq, magnesium sulfate 12 mEq infusion   . fosaprepitant (EMEND) 150 mg, dexamethasone (DECADRON) 12 mg in sodium chloride 0.9 % 145 mL IVPB   . gemcitabine (GEMZAR) 1,140 mg in sodium chloride 0.9 % 250 mL chemo infusion   . heparin lock flush 100 unit/mL   . heparin lock flush 100 unit/mL   . [COMPLETED] palonosetron (ALOXI) injection 0.25 mg   . sodium chloride flush (NS) 0.9 %  injection 10 mL   . [DISCONTINUED] CISplatin (PLATINOL) 31 mg in sodium chloride 0.9 % 250 mL chemo infusion 05/12/2019: CrCl 23 ml/min hold today only gemzar today    ALLERGIES:  Allergies  Allergen Reactions  . Iohexol Hives and Shortness Of Breath    **Patient can not have IV contrast, pt has breakthrough reaction even after 13 hour premeds** Do not give IV contrast PATIENT HAD TO BE TAKEN TO ED, C/O WHELPS, HIVES, DIFFICULTY BREATHING. PATIENT GIVEN IV CONTRAST AFTER 13 HOUR PRE MEDS ON 01/09/2017 WITH NO REACTION NOTED. On 10/19/17 patient had reaction despite premedication.    Marland Kitchen Aciphex [Rabeprazole Sodium] Other (See Comments)    unknown  . Amlodipine Besylate-Valsartan     Rash to high-dose (5/320)  . Esomeprazole Magnesium   . Omeprazole Other (See Comments)    Patient states "medication didn't work"  . Ciprofloxacin Rash  . Penicillins Swelling and Rash    Has patient had a PCN reaction causing immediate rash, facial/tongue/throat swelling, SOB or lightheadedness with hypotension: Yes Has patient had a PCN reaction causing severe rash involving mucus membranes or skin necrosis: Yes Has patient had a PCN reaction that required hospitalization: No Has patient had a PCN reaction occurring within the last 10 years: yes If all of the above answers are "NO", then may proceed with Cephalosporin use.   [name] [Benzonatate] Itching    Full head, face, neck, and chest itching.      PHYSICAL EXAM:  ECOG Performance status: 1  Vitals:   05/12/19 0748  BP: (!) 157/62  Pulse: (!) 58  Resp: 18  Temp: (!) 97 F (36.1 C)  SpO2: 100%   Filed Weights   05/12/19 0748  Weight: 117 lb 9.6  oz (53.3 kg)    Physical Exam Constitutional:      Appearance: Normal appearance. She is normal weight.  Cardiovascular:     Rate and Rhythm: Normal rate and regular rhythm.     Heart sounds: Normal heart sounds.  Pulmonary:     Effort: Pulmonary effort is normal.     Breath  sounds: Normal breath sounds.  Abdominal:     General: Bowel sounds are normal.     Palpations: Abdomen is soft.  Musculoskeletal:        General: Normal range of motion.  Skin:    General: Skin is warm.  Neurological:     Mental Status: She is alert and oriented to person, place, and time. Mental status is at baseline.  Psychiatric:        Mood and Affect: Mood normal.        Behavior: Behavior normal.        Thought Content: Thought content normal.        Judgment: Judgment normal.      LABORATORY DATA:  I have reviewed the labs as listed.  CBC    Component Value Date/Time   WBC 5.2 05/12/2019 0740   RBC 3.05 (L) 05/12/2019 0740   HGB 9.7 (L) 05/12/2019 0740   HCT 29.0 (L) 05/12/2019 0740   PLT 139 (L) 05/12/2019 0740   MCV 95.1 05/12/2019 0740   MCH 31.8 05/12/2019 0740   MCHC 33.4 05/12/2019 0740   RDW 17.0 (H) 05/12/2019 0740   LYMPHSABS 1.1 05/12/2019 0740   MONOABS 0.5 05/12/2019 0740   EOSABS 0.2 05/12/2019 0740   BASOSABS 0.0 05/12/2019 0740   CMP Latest Ref Rng & Units 05/12/2019 04/28/2019 04/14/2019  Glucose 70 - 99 mg/dL 93 99 105(H)  BUN 8 - 23 mg/dL 31(H) 29(H) 35(H)  Creatinine 0.44 - 1.00 mg/dL 1.42(H) 1.29(H) 1.33(H)  Sodium 135 - 145 mmol/L 139 141 139  Potassium 3.5 - 5.1 mmol/L 3.9 4.1 3.6  Chloride 98 - 111 mmol/L 105 107 109  CO2 22 - 32 mmol/L 24 25 20(L)  Calcium 8.9 - 10.3 mg/dL 8.3(L) 8.1(L) 7.9(L)  Total Protein 6.5 - 8.1 g/dL 6.9 7.0 6.3(L)  Total Bilirubin 0.3 - 1.2 mg/dL 0.7 0.6 0.6  Alkaline Phos 38 - 126 U/L 193(H) 194(H) 167(H)  AST 15 - 41 U/L 29 14(L) 15  ALT 0 - 44 U/L '16 11 10     ' I personally performed a face-to-face visit.  All questions were answered to patient's stated satisfaction. Encouraged patient to call with any new concerns or questions before his next visit to the cancer center and we can certain see him sooner, if needed.     ASSESSMENT & PLAN:   Pancreatic cancer metastasized to liver ([place]) 1.  Metastatic  pancreatic cancer to the liver: -Foundation 1 testing shows MS stable, TMB cannot be determined, K-ras G12V, TP 53 mutation. -Gemcitabine and Abraxane from 2015 through August 2018 with progression. -Gemcitabine and Tarceva from 01/28/2017-10/07/2017 with progression. -21 cycles of infusional 5-FU and Onivyde from 10/21/2017-09/08/2018. -7 cycles of FOLFOX from 09/22/2018-12/29/2018. -CT CAP on 01/10/2019 showed increase in size of dominant hepatic lesions near the dome measuring 4.7 cm, previously 2.6 cm.  Other lesions in the liver are stable.  No new lesions seen.  No lesions in the lungs. -Guardian 360 showed K-ras G 12 V mutation.  MSI high was not detected. -3 cycles of gemcitabine, 750 mg/m) and cisplatin (20 mg/m) day 1 and day 17 from  02/10/2019-03/31/2019. -1 unit PRBC on 04/14/2019 for hemoglobin 7.7.  She felt dizzy after the transfusion. -She reported improvement in the back pain.  She is eating 3 meals per day. -Her last treatment was held due to her platelets being 71. -Labs on 05/12/2019 showed hemoglobin 9.7, Platelets 139, ANC 3400.  Her creatinine has increased to 1.42. -We will hold the cisplatin today and give the gemcitabine. -It is plan to repeat scans after her fourth cycle. -She will follow-up in 1 week with labs.  2.  Weight loss: -She is eating 3 meals per day. -She has gained 2 pound since her last visit.      Orders placed this encounter:  Orders Placed This Encounter  Procedures  . Magnesium  . CBC with Differential/Platelet  . Comprehensive metabolic panel  . Lactate dehydrogenase      Francene Finders, FNP-C [place] [phone]

## 2019-05-12 NOTE — Progress Notes (Signed)
0900 Labs reviewed with and pt seen by RLockamy NP and pt approved for Gemzar infusion only today, Cisplatin infusion to be held due to CrCl, per NP.                                                            [name] tolerated Gemzar infusion and IV hydration well without complaints or incident. VSS upon discharge. Pt discharged self ambulatory using her cane in satisfactory condition

## 2019-05-12 NOTE — Progress Notes (Signed)
05/12/19  Scr 1.42 today with CrCl 23 ml/hr  Hold Cisplatin today and give only gemcitabine along with treatment premedications.  Give fluids as ordered per treatment plan.  T.O. Francene Finders, NP-C/Rasa Degrazia Ronnald Ramp, PharmD

## 2019-05-12 NOTE — Patient Instructions (Addendum)
[facility] Discharge Instructions for Patients Receiving Chemotherapy   Beginning January 23rd 2017 lab work for the [facility] will be done in the  Main lab at [facility] on 1st floor. If you have a lab appointment with the [facility] please come in thru the  Main Entrance and check in at the main information desk   Today you received the following chemotherapy agents Gemzar only today as well as hydration. Follow-up as scheduled. Call clinic for any questions or concerns  To help prevent nausea and vomiting after your treatment, we encourage you to take your nausea medication   If you develop nausea and vomiting, or diarrhea that is not controlled by your medication, call the clinic.  The clinic phone number is [phone]. Office hours are Monday-Friday 8:30am-5:00pm.  BELOW ARE SYMPTOMS THAT SHOULD BE REPORTED IMMEDIATELY:  *FEVER GREATER THAN 101.0 F  *CHILLS WITH OR WITHOUT FEVER  NAUSEA AND VOMITING THAT IS NOT CONTROLLED WITH YOUR NAUSEA MEDICATION  *UNUSUAL SHORTNESS OF BREATH  *UNUSUAL BRUISING OR BLEEDING  TENDERNESS IN MOUTH AND THROAT WITH OR WITHOUT PRESENCE OF ULCERS  *URINARY PROBLEMS  *BOWEL PROBLEMS  UNUSUAL RASH Items with * indicate a potential emergency and should be followed up as soon as possible. If you have an emergency after office hours please contact your primary care physician or go to the nearest emergency department.  Please call the clinic during office hours if you have any questions or concerns.   You may also contact the Patient Navigator at [phone] should you have any questions or need assistance in obtaining follow up care.      Resources For Cancer Patients and their Caregivers ? American Cancer Society: Can assist with transportation, wigs, general needs, runs Look Good Feel Better.        ([phone] 681-9684 ? Cancer Care: Provides financial assistance, online support groups, medication/co-pay  assistance.  1-800-813-HOPE 941-316-7793) ? [facility] Assists [place] cancer patients and their families through emotional , educational and financial support.  816-804-5222 ? [place] DSS Where to apply for food stamps, Medicaid and utility assistance. 925-094-2534 ? [employer]: Transportation to medical appointments. 954-576-1611 ? Social Security Administration: May apply for disability if have a Stage IV cancer. ([phone] 775-568-6426 ? [employer], Disability and Transit Services: Assists with nutrition, care and transit needs. 413-463-8128

## 2019-05-16 ENCOUNTER — Ambulatory Visit (INDEPENDENT_AMBULATORY_CARE_PROVIDER_SITE_OTHER): Payer: Medicare HMO | Admitting: Family Medicine

## 2019-05-16 ENCOUNTER — Encounter: Payer: Self-pay | Admitting: Family Medicine

## 2019-05-16 ENCOUNTER — Encounter (HOSPITAL_COMMUNITY)
Admission: RE | Admit: 2019-05-16 | Discharge: 2019-05-16 | Disposition: A | Discharge status: Home / Self Care | Payer: Medicare HMO | Source: Ambulatory Visit | Attending: Family Medicine | Admitting: Family Medicine

## 2019-05-16 ENCOUNTER — Telehealth: Payer: Self-pay | Admitting: *Deleted

## 2019-05-16 ENCOUNTER — Other Ambulatory Visit: Payer: Self-pay

## 2019-05-16 VITALS — BP 144/80 | HR 79 | Temp 97.9°F | Resp 15 | Wt 118.1 lb

## 2019-05-16 DIAGNOSIS — I10 Essential (primary) hypertension: Secondary | ICD-10-CM | POA: Diagnosis not present

## 2019-05-16 DIAGNOSIS — Z13220 Encounter for screening for lipoid disorders: Secondary | ICD-10-CM | POA: Diagnosis not present

## 2019-05-16 DIAGNOSIS — E559 Vitamin D deficiency, unspecified: Secondary | ICD-10-CM

## 2019-05-16 DIAGNOSIS — N3941 Urge incontinence: Secondary | ICD-10-CM

## 2019-05-16 DIAGNOSIS — N3001 Acute cystitis with hematuria: Secondary | ICD-10-CM

## 2019-05-16 DIAGNOSIS — R69 Illness, unspecified: Secondary | ICD-10-CM | POA: Diagnosis not present

## 2019-05-16 DIAGNOSIS — Z9181 History of falling: Secondary | ICD-10-CM

## 2019-05-16 DIAGNOSIS — F419 Anxiety disorder, unspecified: Secondary | ICD-10-CM

## 2019-05-16 LAB — POCT URINALYSIS DIP (CLINITEK)
Bilirubin, UA: NEGATIVE
Glucose, UA: NEGATIVE mg/dL
Ketones, POC UA: NEGATIVE mg/dL
Leukocytes, UA: NEGATIVE
Nitrite, UA: NEGATIVE
POC PROTEIN,UA: 100 — AB
Spec Grav, UA: 1.02 (ref 1.010–1.025)
Urobilinogen, UA: 1 E.U./dL
pH, UA: 6 (ref 5.0–8.0)

## 2019-05-16 NOTE — Patient Instructions (Addendum)
Annual exam in office with MD July 3 or after, call idf you need me before  Wellness with NP in march  Fasting lipid, TSH and vit D at cancer center next week   We will send urine for testing  Please increase protein, eat small ,eals often  Water/ fluid intake of at least 60 ouneas   Careful not to fall

## 2019-05-16 NOTE — Progress Notes (Signed)
   [name]     MRN: [id number]      DOB: 01-08-1929   HPI [name] is here for follow up and re-evaluation of chronic medical conditions, medication management and review of any available recent lab and radiology data.  Preventive health is updated, specifically   Immunization.   Questions or concerns regarding consultations or procedures which the PT has had in the interim are  addressed. The PT denies any adverse reactions to current medications since the last visit.  There are no new concerns.  There are no specific complaints   ROS Denies recent fever or chills.Appetite is fair, denies falls Still receiving treatment for pancreatic cancer and doing well Denies sinus pressure, nasal congestion, ear pain or sore throat. Denies chest congestion, productive cough or wheezing. Denies chest pains, palpitations and leg swelling Denies abdominal pain, nausea, vomiting,diarrhea or constipation.   Denies dysuria, frequency, hesitancy c/o  incontinence. Denies uncontrolled  joint pain, swelling and limitation in mobility. Denies headaches, seizures, numbness, or tingling. Denies depression, anxiety or insomnia. Denies skin break down or rash.   PE  BP (!) 148/72   Pulse 79   Temp 97.9 F (36.6 C) (Temporal)   Resp 15   Wt 118 lb 1.9 oz (53.6 kg)   SpO2 97%   BMI 20.92 kg/m   Patient alert and oriented and in no cardiopulmonary distress.  HEENT: No facial asymmetry, EOMI,     Neck decreased though adequate ROM.  Chest: Clear to auscultation bilaterally.  CVS: S1, S2 no murmurs, no S3.Regular rate.  ABD: Soft non tender.   Ext: No edema  MS: decreased  ROM spine, shoulders, hips and knees.  Skin: Intact, no ulcerations or rash noted.  Psych: Good eye contact, normal affect. Memory intact not anxious or depressed appearing.  CNS: CN 2-12 intact, power,  normal throughout.no focal deficits noted.   Assessment & Plan Essential hypertension Adequate control, no  med change Despite mildly elevated systolic BP, no med change   Urinary incontinence Ongoing, however fairly good response to medication  At high risk for falls Home safety reviewed with patient and son, using cane more frequently  Anxiety Controlled, no change in medication   Acute cystitis with hematuria Urgency and frequency with mildly abn uA, will culture

## 2019-05-16 NOTE — Telephone Encounter (Signed)
Pt wanted to know if dr simpson felt like she should get the covid vaccine as she has a lot of allergies and takes chemo

## 2019-05-16 NOTE — Telephone Encounter (Signed)
I recommend she specifically ask her Oncologist who she sees regularly, currently receiving treatment

## 2019-05-16 NOTE — Telephone Encounter (Signed)
Left generic message returning patients call. Asked for a return call.

## 2019-05-16 NOTE — Telephone Encounter (Signed)
Returned patients call. No answer. Left generic message requesting a call back to discuss.

## 2019-05-17 ENCOUNTER — Encounter: Payer: Medicare HMO | Admitting: Family Medicine

## 2019-05-17 LAB — URINE CULTURE: Culture: NO GROWTH

## 2019-05-19 ENCOUNTER — Other Ambulatory Visit (HOSPITAL_COMMUNITY): Payer: Medicare HMO

## 2019-05-19 ENCOUNTER — Ambulatory Visit (HOSPITAL_COMMUNITY): Payer: Medicare HMO

## 2019-05-19 ENCOUNTER — Ambulatory Visit (HOSPITAL_COMMUNITY): Payer: Medicare HMO | Admitting: Hematology

## 2019-05-19 NOTE — Telephone Encounter (Signed)
Letter mailed

## 2019-05-20 ENCOUNTER — Encounter: Payer: Self-pay | Admitting: Family Medicine

## 2019-05-20 MED ORDER — CLONAZEPAM 0.5 MG PO TABS: 0.5000 mg | ORAL_TABLET | Freq: Every day | ORAL | 1 refills | Status: DC

## 2019-05-20 NOTE — Assessment & Plan Note (Signed)
Home safety reviewed with patient and son, using cane more frequently

## 2019-05-20 NOTE — Assessment & Plan Note (Signed)
Urgency and frequency with mildly abn uA, will culture

## 2019-05-20 NOTE — Assessment & Plan Note (Signed)
Controlled, no change in medication  

## 2019-05-20 NOTE — Assessment & Plan Note (Signed)
Adequate control, no med change Despite mildly elevated systolic BP, no med change

## 2019-05-20 NOTE — Assessment & Plan Note (Signed)
Ongoing, however fairly good response to medication

## 2019-05-26 NOTE — Telephone Encounter (Signed)
Pt called back advised to speak with oncologist before taking vaccine with verbal understanding.

## 2019-05-30 ENCOUNTER — Other Ambulatory Visit: Payer: Self-pay

## 2019-05-30 ENCOUNTER — Other Ambulatory Visit (HOSPITAL_COMMUNITY): Payer: Self-pay | Admitting: *Deleted

## 2019-05-30 ENCOUNTER — Inpatient Hospital Stay (HOSPITAL_COMMUNITY): Payer: Medicare HMO

## 2019-05-30 ENCOUNTER — Encounter (HOSPITAL_COMMUNITY): Payer: Self-pay | Admitting: Hematology

## 2019-05-30 ENCOUNTER — Inpatient Hospital Stay (HOSPITAL_COMMUNITY): Payer: Medicare HMO | Attending: Hematology | Admitting: Hematology

## 2019-05-30 VITALS — BP 164/56 | HR 59 | Temp 97.0°F | Resp 18

## 2019-05-30 VITALS — BP 165/53 | HR 53 | Temp 97.1°F | Resp 18 | Wt 117.9 lb

## 2019-05-30 DIAGNOSIS — C787 Secondary malignant neoplasm of liver and intrahepatic bile duct: Secondary | ICD-10-CM

## 2019-05-30 DIAGNOSIS — C259 Malignant neoplasm of pancreas, unspecified: Secondary | ICD-10-CM

## 2019-05-30 DIAGNOSIS — Z5189 Encounter for other specified aftercare: Secondary | ICD-10-CM | POA: Diagnosis not present

## 2019-05-30 DIAGNOSIS — C252 Malignant neoplasm of tail of pancreas: Secondary | ICD-10-CM | POA: Diagnosis present

## 2019-05-30 DIAGNOSIS — Z5111 Encounter for antineoplastic chemotherapy: Secondary | ICD-10-CM | POA: Diagnosis not present

## 2019-05-30 DIAGNOSIS — Z79899 Other long term (current) drug therapy: Secondary | ICD-10-CM | POA: Diagnosis not present

## 2019-05-30 LAB — CBC WITH DIFFERENTIAL/PLATELET
Abs Immature Granulocytes: 0.01 10*3/uL (ref 0.00–0.07)
Basophils Absolute: 0 10*3/uL (ref 0.0–0.1)
Basophils Relative: 1 %
Eosinophils Absolute: 0.1 10*3/uL (ref 0.0–0.5)
Eosinophils Relative: 3 %
HCT: 26.8 % — ABNORMAL LOW (ref 36.0–46.0)
Hemoglobin: 8.6 g/dL — ABNORMAL LOW (ref 12.0–15.0)
Immature Granulocytes: 0 %
Lymphocytes Relative: 31 %
Lymphs Abs: 0.9 10*3/uL (ref 0.7–4.0)
MCH: 30.4 pg (ref 26.0–34.0)
MCHC: 32.1 g/dL (ref 30.0–36.0)
MCV: 94.7 fL (ref 80.0–100.0)
Monocytes Absolute: 0.4 10*3/uL (ref 0.1–1.0)
Monocytes Relative: 13 %
Neutro Abs: 1.5 10*3/uL — ABNORMAL LOW (ref 1.7–7.7)
Neutrophils Relative %: 52 %
Platelets: 154 10*3/uL (ref 150–400)
RBC: 2.83 MIL/uL — ABNORMAL LOW (ref 3.87–5.11)
RDW: 16.6 % — ABNORMAL HIGH (ref 11.5–15.5)
WBC: 2.9 10*3/uL — ABNORMAL LOW (ref 4.0–10.5)
nRBC: 0 % (ref 0.0–0.2)

## 2019-05-30 LAB — COMPREHENSIVE METABOLIC PANEL
ALT: 13 U/L (ref 0–44)
AST: 18 U/L (ref 15–41)
Albumin: 3.6 g/dL (ref 3.5–5.0)
Alkaline Phosphatase: 189 U/L — ABNORMAL HIGH (ref 38–126)
Anion gap: 8 (ref 5–15)
BUN: 31 mg/dL — ABNORMAL HIGH (ref 8–23)
CO2: 23 mmol/L (ref 22–32)
Calcium: 8.4 mg/dL — ABNORMAL LOW (ref 8.9–10.3)
Chloride: 106 mmol/L (ref 98–111)
Creatinine, Ser: 1.38 mg/dL — ABNORMAL HIGH (ref 0.44–1.00)
GFR calc Af Amer: 39 mL/min — ABNORMAL LOW (ref >60)
GFR calc non Af Amer: 34 mL/min — ABNORMAL LOW (ref >60)
Glucose, Bld: 92 mg/dL (ref 70–99)
Potassium: 3.8 mmol/L (ref 3.5–5.1)
Sodium: 137 mmol/L (ref 135–145)
Total Bilirubin: 0.6 mg/dL (ref 0.3–1.2)
Total Protein: 7.1 g/dL (ref 6.5–8.1)

## 2019-05-30 LAB — LIPID PANEL
Cholesterol: 163 mg/dL (ref 0–200)
HDL: 67 mg/dL (ref >40)
LDL Cholesterol: 83 mg/dL (ref 0–99)
Total CHOL/HDL Ratio: 2.4 RATIO
Triglycerides: 63 mg/dL (ref <150)
VLDL: 13 mg/dL (ref 0–40)

## 2019-05-30 LAB — MAGNESIUM: Magnesium: 2 mg/dL (ref 1.7–2.4)

## 2019-05-30 LAB — VITAMIN D 25 HYDROXY (VIT D DEFICIENCY, FRACTURES): Vit D, 25-Hydroxy: 8.46 ng/mL — ABNORMAL LOW (ref 30–100)

## 2019-05-30 LAB — TSH: TSH: 1.447 u[IU]/mL (ref 0.350–4.500)

## 2019-05-30 LAB — LACTATE DEHYDROGENASE: LDH: 135 U/L (ref 98–192)

## 2019-05-30 MED ORDER — ERGOCALCIFEROL 1.25 MG (50000 UT) PO CAPS: 50000.0000 [IU] | ORAL_CAPSULE | ORAL | 3 refills | Status: DC

## 2019-05-30 MED ORDER — SODIUM CHLORIDE 0.9 % IV SOLN
20.0000 mg/m2 | Freq: Once | INTRAVENOUS | Status: AC
  Administered 2019-05-30: 14:00:00 31 mg via INTRAVENOUS
  Filled 2019-05-30: qty 31

## 2019-05-30 MED ORDER — SODIUM CHLORIDE 0.9 % IV SOLN: Freq: Once | INTRAVENOUS | Status: AC

## 2019-05-30 MED ORDER — SODIUM CHLORIDE 0.9 % IV SOLN
750.0000 mg/m2 | Freq: Once | INTRAVENOUS | Status: AC
  Administered 2019-05-30: 13:00:00 1140 mg via INTRAVENOUS
  Filled 2019-05-30: qty 5.26

## 2019-05-30 MED ORDER — POTASSIUM CHLORIDE 2 MEQ/ML IV SOLN
Freq: Once | INTRAVENOUS | Status: AC
  Filled 2019-05-30: qty 10

## 2019-05-30 MED ORDER — SODIUM CHLORIDE 0.9 % IV SOLN: Freq: Once | INTRAVENOUS | Status: DC

## 2019-05-30 MED ORDER — SODIUM CHLORIDE 0.9 % IV SOLN: INTRAVENOUS | Status: DC

## 2019-05-30 MED ORDER — PALONOSETRON HCL INJECTION 0.25 MG/5ML
0.2500 mg | Freq: Once | INTRAVENOUS | Status: AC
  Administered 2019-05-30: 12:00:00 0.25 mg via INTRAVENOUS
  Filled 2019-05-30: qty 5

## 2019-05-30 MED ORDER — SODIUM CHLORIDE 0.9 % IV SOLN
Freq: Once | INTRAVENOUS | Status: AC
  Filled 2019-05-30: qty 5

## 2019-05-30 MED ORDER — HEPARIN SOD (PORK) LOCK FLUSH 100 UNIT/ML IV SOLN
500.0000 [IU] | Freq: Once | INTRAVENOUS | Status: AC | PRN
  Administered 2019-05-30: 15:00:00 500 [IU]

## 2019-05-30 MED ORDER — SODIUM CHLORIDE 0.9% FLUSH
10.0000 mL | INTRAVENOUS | Status: DC | PRN
  Administered 2019-05-30: 10 mL

## 2019-05-30 NOTE — Progress Notes (Signed)
Labs reviewed today with Dr. Delton Coombes. Proceed with treatment today per MD. [place] 1500.   Treatment given per orders. Patient tolerated it well without problems. Vitals stable and discharged home from clinic ambulatory. Follow up as scheduled.

## 2019-05-30 NOTE — Progress Notes (Signed)
Patient has been assessed, vital signs and labs have been reviewed by Dr. Delton Coombes. ANC, Creatinine, LFTs, and Platelets are within treatment parameters per Dr. Delton Coombes. The patient is good to proceed with treatment at this time. She will need to come back tomorrow for 1 liter of fluids with electrolytes.

## 2019-05-30 NOTE — Progress Notes (Signed)
[place], [place] [address]   CLINIC:  Medical Oncology/Hematology  PCP:  Fayrene Helper, MD [address], [place] [address] [phone]   REASON FOR VISIT:  Follow-up for metastatic pancreatic cancer to the liver   BRIEF ONCOLOGIC HISTORY:  Oncology History  Pancreatic cancer metastasized to liver ([place])  03/10/2014 Initial Diagnosis   Pancreatic cancer metastasized to liver   03/22/2014 - 03/05/2016 Chemotherapy   Abraxane/Gemzar days 1, 8, every 28 days.  Day 15 was cancelled due to leukopenia and thrombocytopenia on day 15 cycle 1.   05/24/2014 Treatment Plan Change   Day 8 of cycle 3 is held with ANC of 1.1   06/05/2014 Imaging   CT C/A/P . Interval decrease in size of the pancreatic tail mass.Improved hepatic metastatic disease. No new lesions. No CT findings for metastatic disease involving the chest.   08/09/2014 Tumor Marker   CA 19-9= 33 (WNL)   10/26/2014 Imaging   MRI- Continued interval decrease in size of the hepatic metastatic lesions and no new lesions are identified. Continued decrease in size of the pancreatic tail lesion.   01/03/2015 Tumor Marker   Results for ANNAYA, BANGERT (MRN [id number]) as of 01/18/2015 12:52  01/03/2015 10:00 CA 19-9: 14    01/17/2015 Imaging   MRI- Response to therapy of hepatic metastasis.  Similar size of a pancreatic tail lesion.   03/20/2015 Imaging   MRI-L spine- Severe disc space narrowing at L2-L3, with endplate reactive changes. Large disc extrusion into the ventral epidural space,central to the RIGHT with a cephalad migrated free fragment. Additional Large disc extrusion into the retroperitone...   03/22/2015 Imaging   CT pelvis- No evidence for metastatic disease within the pelvis.   07/24/2015 Imaging   MRI abd- Continued response to therapy, with no residual detectable liver metastases. No new sites of metastatic disease in the abdomen.    08/15/2015 Code Status      She confirms desire for DNR status.   12/21/2015 Imaging   MRI liver- Severe image degradation due to motion artifact reducing diagnostic sensitivity and specificity. Reduce conspicuity of the pancreatic tail lesion suggesting further improvement. The original liver lesions have resolved.   03/05/2016 Treatment Plan Change   Chemotherapy holiday/Break after 2 years worth of treatment   04/07/2016 Imaging   CT chest- When compared to recent chest CT, new minimally displaced anterior left sixth rib fracture. Slight increase in subcarinal adenopathy   04/28/2016 Imaging   Bone density- BMD as determined from Femur Neck Right is 0.705 g/cm2 with a T-Score of -2.4. This patient is considered osteopenic according to [facility]) criteria. Compared with the prior study on 02/23/2013, the BMD of the lumbar spine/rt. femoral neck show a statistically significant decrease.    05/23/2016 Imaging   MRI abd- 1. Exam is significantly degraded by patient respiratory motion. Consider follow-up exams with CT abdomen with without contrast per pancreatic protocol. 2. Fullness in tail of pancreas is more prominent and potentially increased in size. Recommend close attention on follow-up. (Consider CT as above) 3. No explanation for back pain.   09/11/2016 Imaging   MRI pancreas- Interval progression of pancreatic tail lesion. New differential perfusion right liver with areas of heterogeneity. Underlying metastatic disease in this region not excluded.   09/11/2016 Progression   MRi in conjunction with rising CA 19-9 are indicative of relapse of disease.   01/09/2017 Progression   CT C/A/P: 2.1 x  2.9 cm lesion in the pancreatic tail and abutting the splenic hilum, corresponding to known primary pancreatic neoplasm, mildly increased.  Scattered small hypoenhancing lesions in the liver measuring up to 10 mm, approximately 8-10 in number, suspicious for hepatic metastases.  No  findings specific for metastatic disease in the chest.  Additional ancillary findings as above.    01/27/2017 - 10/20/2017 Chemotherapy   The patient had gemcitabine (GEMZAR) 1,292 mg in sodium chloride 0.9 % 250 mL chemo infusion, 800 mg/m2 = 1,292 mg, Intravenous,  Once, 9 of 12 cycles Administration: 1,292 mg (01/28/2017), 1,292 mg (02/11/2017), 1,292 mg (02/25/2017), 1,292 mg (03/11/2017), 1,292 mg (03/25/2017), 1,292 mg (04/08/2017), 1,292 mg (04/22/2017), 1,292 mg (04/29/2017), 1,292 mg (05/20/2017), 1,292 mg (06/03/2017), 1,292 mg (06/24/2017), 1,292 mg (07/08/2017), 1,292 mg (07/29/2017), 1,292 mg (08/12/2017), 1,292 mg (08/26/2017), 1,292 mg (09/09/2017), 1,292 mg (09/23/2017), 1,292 mg (10/07/2017)  for chemotherapy treatment.    10/21/2017 - 09/22/2018 Chemotherapy   The patient had leucovorin 700 mg in dextrose 5 % 250 mL infusion, 644 mg, Intravenous,  Once, 22 of 22 cycles Administration: 700 mg (10/21/2017), 700 mg (11/04/2017), 700 mg (11/18/2017), 700 mg (12/02/2017), 700 mg (12/16/2017), 700 mg (12/30/2017), 700 mg (01/13/2018), 700 mg (01/27/2018), 700 mg (02/10/2018), 700 mg (02/24/2018), 700 mg (03/10/2018), 700 mg (03/24/2018), 700 mg (04/28/2018), 700 mg (05/17/2018), 700 mg (05/31/2018), 700 mg (06/16/2018), 700 mg (06/30/2018), 700 mg (07/21/2018), 700 mg (08/04/2018), 700 mg (08/25/2018), 700 mg (09/08/2018) ondansetron (ZOFRAN) 8 mg in sodium chloride 0.9 % 50 mL IVPB, , Intravenous,  Once, 21 of 21 cycles Administration:  (11/04/2017),  (01/27/2018),  (02/10/2018),  (02/24/2018),  (03/10/2018),  (03/24/2018),  (04/28/2018),  (05/17/2018),  (05/31/2018),  (06/16/2018),  (06/30/2018),  (07/21/2018),  (08/04/2018),  (08/25/2018),  (09/08/2018) fluorouracil (ADRUCIL) 3,850 mg in sodium chloride 0.9 % 73 mL chemo infusion, 2,400 mg/m2 = 3,850 mg, Intravenous, 1 Day/Dose, 22 of 22 cycles Dose modification: 1,920 mg/m2 (original dose 2,400 mg/m2, Cycle 2, Reason: Provider Judgment, Comment: mucisitis) Administration: 3,850 mg  (10/21/2017), 3,100 mg (11/04/2017), 3,100 mg (11/18/2017), 3,100 mg (12/02/2017), 3,100 mg (12/16/2017), 3,100 mg (12/30/2017), 3,100 mg (01/13/2018), 3,100 mg (01/27/2018), 3,100 mg (02/10/2018), 3,100 mg (02/24/2018), 3,100 mg (03/10/2018), 3,100 mg (03/24/2018), 3,100 mg (04/28/2018), 3,100 mg (05/17/2018), 3,100 mg (05/31/2018), 3,100 mg (06/16/2018), 3,100 mg (06/30/2018), 3,100 mg (07/21/2018), 3,100 mg (08/04/2018), 3,100 mg (08/25/2018), 3,100 mg (09/08/2018) irinotecan LIPOSOME (ONIVYDE) 81.7 mg in sodium chloride 0.9 % 500 mL chemo infusion, 50 mg/m2 = 81.7 mg (100 % of original dose 50 mg/m2), Intravenous, Once, 22 of 22 cycles Dose modification: 50 mg/m2 (original dose 50 mg/m2, Cycle 1, Reason: Provider Judgment), 50 mg/m2 (original dose 50 mg/m2, Cycle 2, Reason: Provider Judgment) Administration: 81.7 mg (10/21/2017), 81.7 mg (11/04/2017), 81.7 mg (11/18/2017), 81.7 mg (12/02/2017), 81.7 mg (12/16/2017), 81.7 mg (12/30/2017), 81.7 mg (01/13/2018), 81.7 mg (01/27/2018), 81.7 mg (02/10/2018), 81.7 mg (02/24/2018), 81.7 mg (03/10/2018), 81.7 mg (03/24/2018), 81.7 mg (04/28/2018), 81.7 mg (05/17/2018), 81.7 mg (05/31/2018), 81.7 mg (06/16/2018), 81.7 mg (06/30/2018), 81.7 mg (07/21/2018), 81.7 mg (08/04/2018), 81.7 mg (08/25/2018), 81.7 mg (09/08/2018)  for chemotherapy treatment.    09/22/2018 - 01/11/2019 Chemotherapy   The patient had palonosetron (ALOXI) injection 0.25 mg, 0.25 mg, Intravenous,  Once, 7 of 8 cycles Administration: 0.25 mg (09/22/2018), 0.25 mg (10/11/2018), 0.25 mg (10/25/2018), 0.25 mg (12/01/2018), 0.25 mg (12/15/2018), 0.25 mg (11/17/2018), 0.25 mg (12/29/2018) pegfilgrastim (NEULASTA) injection 6 mg, 6 mg, Subcutaneous, Once, 5 of 5 cycles Administration: 6 mg (10/13/2018), 6 mg (10/27/2018), 6 mg (12/03/2018),  6 mg (12/17/2018), 6 mg (11/19/2018) pegfilgrastim-cbqv (UDENYCA) injection 6 mg, 6 mg, Subcutaneous, Once, 1 of 2 cycles Administration: 6 mg (12/31/2018) leucovorin 628 mg in dextrose 5 % 250 mL infusion, 400 mg/m2 =  628 mg, Intravenous,  Once, 7 of 8 cycles Administration: 628 mg (09/22/2018), 628 mg (10/11/2018), 628 mg (10/25/2018), 628 mg (12/01/2018), 628 mg (12/15/2018), 628 mg (11/17/2018), 628 mg (12/29/2018) oxaliplatin (ELOXATIN) 100 mg in dextrose 5 % 500 mL chemo infusion, 65 mg/m2 = 100 mg (100 % of original dose 65 mg/m2), Intravenous,  Once, 7 of 8 cycles Dose modification: 65 mg/m2 (original dose 65 mg/m2, Cycle 1, Reason: Provider Judgment) Administration: 100 mg (09/22/2018), 100 mg (10/11/2018), 100 mg (10/25/2018), 100 mg (12/01/2018), 100 mg (12/15/2018), 100 mg (11/17/2018), 100 mg (12/29/2018) fluorouracil (ADRUCIL) chemo injection 500 mg, 320 mg/m2 = 500 mg (100 % of original dose 320 mg/m2), Intravenous,  Once, 7 of 8 cycles Dose modification: 320 mg/m2 (original dose 320 mg/m2, Cycle 1, Reason: Provider Judgment) Administration: 500 mg (09/22/2018), 500 mg (10/11/2018), 500 mg (10/25/2018), 500 mg (12/01/2018), 500 mg (12/15/2018), 500 mg (11/17/2018), 500 mg (12/29/2018) fluorouracil (ADRUCIL) 3,000 mg in sodium chloride 0.9 % 90 mL chemo infusion, 1,920 mg/m2 = 3,000 mg (100 % of original dose 1,920 mg/m2), Intravenous, 1 Day/Dose, 7 of 8 cycles Dose modification: 1,920 mg/m2 (original dose 1,920 mg/m2, Cycle 1, Reason: Provider Judgment) Administration: 3,000 mg (09/22/2018), 3,000 mg (10/11/2018), 3,000 mg (10/25/2018), 3,000 mg (12/01/2018), 3,000 mg (12/15/2018), 3,000 mg (11/17/2018), 3,000 mg (12/29/2018)  for chemotherapy treatment.    12/24/2018 Genetic Testing   Negative genetic testing on the common hereditary cancer panel.  The Common Hereditary Gene Panel offered by [facility] includes sequencing and/or deletion duplication testing of the following 48 genes: APC, ATM, AXIN2, BARD1, BMPR1A, BRCA1, BRCA2, BRIP1, CDH1, CDK4, CDKN2A (p14ARF), CDKN2A (p16INK4a), CHEK2, CTNNA1, DICER1, EPCAM (Deletion/duplication testing only), GREM1 (promoter region deletion/duplication testing only), KIT, MEN1, MLH1, MSH2, MSH3, MSH6,  MUTYH, NBN, NF1, NHTL1, PALB2, PDGFRA, PMS2, POLD1, POLE, PTEN, RAD50, RAD51C, RAD51D, RNF43, SDHB, SDHC, SDHD, SMAD4, SMARCA4. STK11, TP53, TSC1, TSC2, and VHL.  The following genes were evaluated for sequence changes only: SDHA and HOXB13 c.251G>A variant only. The report date is December 24, 2018.  DICER1 VUS identified.  This will not change medical decision making.   02/10/2019 -  Chemotherapy   The patient had palonosetron (ALOXI) injection 0.25 mg, 0.25 mg, Intravenous,  Once, 4 of 4 cycles Administration: 0.25 mg (02/10/2019), 0.25 mg (02/24/2019), 0.25 mg (03/10/2019), 0.25 mg (03/17/2019), 0.25 mg (03/31/2019), 0.25 mg (04/14/2019), 0.25 mg (05/12/2019), 0.25 mg (05/30/2019) pegfilgrastim-jmdb (FULPHILA) injection 6 mg, 6 mg, Subcutaneous,  Once, 4 of 4 cycles Administration: 6 mg (02/25/2019), 6 mg (03/18/2019) CISplatin (PLATINOL) 31 mg in sodium chloride 0.9 % 250 mL chemo infusion, 20 mg/m2 = 31 mg (80 % of original dose 25 mg/m2), Intravenous,  Once, 4 of 4 cycles Dose modification: 20 mg/m2 (80 % of original dose 25 mg/m2, Cycle 1, Reason: Other (see comments), Comment: renal dysfunction) Administration: 31 mg (02/10/2019), 31 mg (02/24/2019), 31 mg (03/10/2019), 31 mg (03/17/2019), 31 mg (03/31/2019), 31 mg (04/14/2019), 31 mg (05/30/2019) gemcitabine (GEMZAR) 1,140 mg in sodium chloride 0.9 % 250 mL chemo infusion, 750 mg/m2 = 1,140 mg (75 % of original dose 1,000 mg/m2), Intravenous,  Once, 4 of 4 cycles Dose modification: 750 mg/m2 (75 % of original dose 1,000 mg/m2, Cycle 1, Reason: Provider Judgment) Administration: 1,140 mg (02/10/2019), 1,140 mg (02/24/2019), 1,140 mg (03/10/2019), 1,140  mg (03/17/2019), 1,140 mg (03/31/2019), 1,140 mg (04/14/2019), 1,140 mg (05/12/2019), 1,140 mg (05/30/2019) fosaprepitant (EMEND) 150 mg, dexamethasone (DECADRON) 12 mg in sodium chloride 0.9 % 145 mL IVPB, , Intravenous,  Once, 4 of 4 cycles Administration:  (02/10/2019),  (02/24/2019),  (03/10/2019),   (03/17/2019),  (03/31/2019),  (04/14/2019),  (05/12/2019),  (05/30/2019)  for chemotherapy treatment.       CANCER STAGING: Cancer Staging Pancreatic cancer metastasized to liver [facility]) Staging form: Pancreas, AJCC 7th Edition - Clinical: Stage IV (T3, N1, M1) - Signed by Baird Cancer, PA-C on 03/16/2014 - Pathologic: No stage assigned - Unsigned    INTERVAL HISTORY:  Ms. Friesenhahn 86 y.o. female seen for follow-up of metastatic pancreatic cancer and toxicity assessment prior to cycle 4-day 15 chemotherapy.  Appetite and energy levels are 75%.  Denies any tingling or numbness in extremities.  Reportedly felt weak 1 week after her last treatment on 05/12/2019.  Left-sided rib pain posteriorly is also stable.  REVIEW OF SYSTEMS:  Review of Systems  Cardiovascular: Positive for leg swelling.  All other systems reviewed and are negative.    PAST MEDICAL/SURGICAL HISTORY:  Past Medical History:  Diagnosis Date  . Anemia due to antineoplastic chemotherapy 09/12/2015   Started Aranesp 500 mcg on 09/12/2015  . Anxiety   . Chronic diarrhea   . Depression   . DNR (do not resuscitate) 08/16/2015  . Erosive esophagitis   . Family history of breast cancer in female   . Family history of prostate cancer   . GERD (gastroesophageal reflux disease)   . Hx of adenomatous colonic polyps    tubular adenomas, last found in 2008  . Hyperplastic colon polyp 03/19/10   tcs by Dr. Gala Romney  . Hypertension 20 years   . Kidney stone    hx/ crushed   . Opioid contract exists 04/18/2015   With Dr. Moshe Cipro  . Pancreatic cancer ([place]) 02/2014  . Pancreatic cancer metastasized to liver ([place]) 03/10/2014  . Schatzki's ring 08/26/10   Last dilated on EGD by Dr. Trevor Iha [facility], linear gastric erosions, BI hemigastrectomy   Past Surgical History:  Procedure Laterality Date  . BREAST LUMPECTOMY Left   . bunion removal     from both feet   . CHOLECYSTECTOMY  1965   . COLONOSCOPY  03/19/2010   DR Gala Romney,, normal  TI, pancolonic diverticula, random colon bx neg., hyperplastic polyps removed  . ESOPHAGOGASTRODUODENOSCOPY  11/22/2003   DR Gala Romney, erosive RE, Billroth I  . ESOPHAGOGASTRODUODENOSCOPY  08/26/10   Dr. Gala Romney- moderate severe ERE, Scahtzki ring s/p dilation, Billroth I, linear gastric erosions, bx-gastric xanthelasma  . LEFT SHOULDER SURGERY  2009   DR HARRISON  . ORIF ANKLE FRACTURE Right 05/22/2013   Procedure: OPEN REDUCTION INTERNAL FIXATION (ORIF) RIGHT ANKLE FRACTURE;  Surgeon: Sanjuana Kava, MD;  Location: [facility];  Service: Orthopedics;  Laterality: Right;  . stomach ulcer  50 years ago    had some of her stomach removed      SOCIAL HISTORY:  Social History   Socioeconomic History  . Marital status: Widowed    Spouse name: Not on file  . Number of children: 2  . Years of education: 70  . Highest education level: 12th grade  Occupational History  . Occupation: retired from Estate manager/land agent: RETIRED  Tobacco Use  . Smoking status: Current Some Day Smoker    Packs/day: 0.50    Years: 60.00    Pack years: 30.00    Types:  Cigarettes    Last attempt to quit: 09/09/2016    Years since quitting: 2.7  . Smokeless tobacco: Never Used  Substance and Sexual Activity  . Alcohol use: No  . Drug use: No  . Sexual activity: Not Currently  Other Topics Concern  . Not on file  Social History Narrative   Lives with son alone    Social Determinants of Health   Financial Resource Strain:   . Difficulty of Paying Living Expenses: Not on file  Food Insecurity:   . Worried About Charity fundraiser in the Last Year: Not on file  . Ran Out of Food in the Last Year: Not on file  Transportation Needs:   . Lack of Transportation (Medical): Not on file  . Lack of Transportation (Non-Medical): Not on file  Physical Activity:   . Days of Exercise per Week: Not on file  . Minutes of Exercise per Session: Not on file  Stress:   . Feeling of Stress : Not on file  Social Connections:   .  Frequency of Communication with Friends and Family: Not on file  . Frequency of Social Gatherings with Friends and Family: Not on file  . Attends Religious Services: Not on file  . Active Member of Clubs or Organizations: Not on file  . Attends Archivist Meetings: Not on file  . Marital Status: Not on file  Intimate Partner Violence:   . Fear of Current or Ex-Partner: Not on file  . Emotionally Abused: Not on file  . Physically Abused: Not on file  . Sexually Abused: Not on file    FAMILY HISTORY:  Family History  Problem Relation Age of Onset  . Hypertension Mother   . Kidney failure Brother        on dialysis  . Diabetes Sister   . Diabetes Sister   . Hypertension Father   . Kidney failure Sister   . Kidney Stones Brother   . Breast cancer Son 74  . Gout Son 74  . Gout Nephew 57  . Liver disease Neg Hx   . Colon cancer Neg Hx     CURRENT MEDICATIONS:  Outpatient Encounter Medications as of 05/30/2019  Medication Sig  . amLODipine (NORVASC) 10 MG tablet TAKE 1 TABLET DAILY  . CISPLATIN IV Inject into the vein. Days 1, 8 every 21 days  . clobetasol (TEMOVATE) 0.05 % external solution APP AA ON SCALP BID PRN NOT TO FACE GROIN OR UNDERARMS  . clonazePAM (KLONOPIN) 0.5 MG tablet Take 1 tablet (0.5 mg total) by mouth at bedtime.  . cycloSPORINE (RESTASIS) 0.05 % ophthalmic emulsion Place 1 drop into both eyes 2 (two) times daily.  . fluorometholone (FML) 0.1 % ophthalmic suspension INSTILL 1 DROP INTO EACH EYE THREE TIMES DAILY FOR 14 DAYS  . fluticasone (FLONASE) 50 MCG/ACT nasal spray Place 1 spray into both nostrils daily.  Marland Kitchen GEMCITABINE HCL IV Inject into the vein. Days 1, 8 every 21 days  . lidocaine (XYLOCAINE) 2 % solution RINSE WITH 5ML AS NEEDED TO RELIEVE MOUTH PAIN.  . metoprolol tartrate (LOPRESSOR) 25 MG tablet TAKE 1 TABLET DAILY FOR BLOOD PRESSURE (DOSE REDUCTION)  . mirtazapine (REMERON) 15 MG tablet Take 15 mg by mouth at bedtime.  Marland Kitchen MYRBETRIQ 50 MG  TB24 tablet TAKE 1 TABLET DAILY  . pantoprazole (PROTONIX) 20 MG tablet TAKE 1 TABLET DAILY  . senna-docusate (SENOKOT-S) 8.6-50 MG tablet Take 1 tablet by mouth daily.  Marland Kitchen acetaminophen (TYLENOL) 500  MG tablet Take 500 mg by mouth every 6 (six) hours as needed for mild pain or moderate pain.   Marland Kitchen lidocaine-prilocaine (EMLA) cream Apply 1 application topically as needed. Apply to portacath site as needed (Patient not taking: Reported on 05/30/2019)  . loperamide (IMODIUM A-D) 2 MG tablet Take 2 mg by mouth 4 (four) times daily as needed for diarrhea or loose stools.  . [DISCONTINUED] clonazePAM (KLONOPIN) 0.5 MG tablet Take 1 tablet (0.5 mg total) by mouth at bedtime.  . [DISCONTINUED] prochlorperazine (COMPAZINE) 10 MG tablet Take 1 tablet (10 mg total) by mouth every 6 (six) hours as needed (Nausea or vomiting).   No facility-administered encounter medications on file as of 05/30/2019.    ALLERGIES:  Allergies  Allergen Reactions  . Iohexol Hives and Shortness Of Breath    **Patient can not have IV contrast, pt has breakthrough reaction even after 13 hour premeds** Do not give IV contrast PATIENT HAD TO BE TAKEN TO ED, C/O WHELPS, HIVES, DIFFICULTY BREATHING. PATIENT GIVEN IV CONTRAST AFTER 13 HOUR PRE MEDS ON 01/09/2017 WITH NO REACTION NOTED. On 10/19/17 patient had reaction despite premedication.    Marland Kitchen Aciphex [Rabeprazole Sodium] Other (See Comments)    unknown  . Amlodipine Besylate-Valsartan     Rash to high-dose (5/320)  . Esomeprazole Magnesium   . Omeprazole Other (See Comments)    Patient states "medication didn't work"  . Ciprofloxacin Rash  . Penicillins Swelling and Rash    Has patient had a PCN reaction causing immediate rash, facial/tongue/throat swelling, SOB or lightheadedness with hypotension: Yes Has patient had a PCN reaction causing severe rash involving mucus membranes or skin necrosis: Yes Has patient had a PCN reaction that required hospitalization: No Has patient  had a PCN reaction occurring within the last 10 years: yes If all of the above answers are "NO", then may proceed with Cephalosporin use.   [name] [Benzonatate] Itching    Full head, face, neck, and chest itching.      PHYSICAL EXAM:  ECOG Performance status: 1  Vitals:   05/30/19 0807  BP: (!) 165/53  Pulse: (!) 53  Resp: 18  Temp: (!) 97.1 F (36.2 C)  SpO2: 100%   Filed Weights   05/30/19 0807  Weight: 117 lb 14.4 oz (53.5 kg)    Physical Exam Vitals reviewed.  Constitutional:      Appearance: Normal appearance.  Cardiovascular:     Rate and Rhythm: Normal rate and regular rhythm.     Heart sounds: Normal heart sounds.  Pulmonary:     Effort: Pulmonary effort is normal.     Breath sounds: Normal breath sounds.  Abdominal:     General: There is no distension.     Palpations: Abdomen is soft. There is no mass.  Musculoskeletal:        General: No swelling.  Skin:    General: Skin is warm.  Neurological:     General: No focal deficit present.     Mental Status: She is alert and oriented to person, place, and time.  Psychiatric:        Mood and Affect: Mood normal.        Behavior: Behavior normal.      LABORATORY DATA:  I have reviewed the labs as listed.  CBC    Component Value Date/Time   WBC 2.9 (L) 05/30/2019 0805   RBC 2.83 (L) 05/30/2019 0805   HGB 8.6 (L) 05/30/2019 0805   HCT 26.8 (L)  05/30/2019 0805   PLT 154 05/30/2019 0805   MCV 94.7 05/30/2019 0805   MCH 30.4 05/30/2019 0805   MCHC 32.1 05/30/2019 0805   RDW 16.6 (H) 05/30/2019 0805   LYMPHSABS 0.9 05/30/2019 0805   MONOABS 0.4 05/30/2019 0805   EOSABS 0.1 05/30/2019 0805   BASOSABS 0.0 05/30/2019 0805   CMP Latest Ref Rng & Units 05/30/2019 05/12/2019 04/28/2019  Glucose 70 - 99 mg/dL 92 93 99  BUN 8 - 23 mg/dL 31(H) 31(H) 29(H)  Creatinine 0.44 - 1.00 mg/dL 1.38(H) 1.42(H) 1.29(H)  Sodium 135 - 145 mmol/L 137 139 141  Potassium 3.5 - 5.1 mmol/L 3.8 3.9 4.1  Chloride  98 - 111 mmol/L 106 105 107  CO2 22 - 32 mmol/L '23 24 25  '$ Calcium 8.9 - 10.3 mg/dL 8.4(L) 8.3(L) 8.1(L)  Total Protein 6.5 - 8.1 g/dL 7.1 6.9 7.0  Total Bilirubin 0.3 - 1.2 mg/dL 0.6 0.7 0.6  Alkaline Phos 38 - 126 U/L 189(H) 193(H) 194(H)  AST 15 - 41 U/L 18 29 14(L)  ALT 0 - 44 U/L '13 16 11       '$ DIAGNOSTIC IMAGING:  I have independently reviewed the scans and discussed with the patient.   I have reviewed Venita Lick LPN's note and agree with the documentation.  I personally performed a face-to-face visit, made revisions and my assessment and plan is as follows.    ASSESSMENT & PLAN:   Pancreatic cancer metastasized to liver ([place]) 1.  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 - Gemcitabine and Abraxane from normal 2015 through August 2018 with progression. -Gemcitabine and Tarceva from 01/28/2017 through 10/07/2017 with progression. -21 cycles of infusional 5-FU and Onivyde from 10/21/2017-09/08/2018. -7 cycles of FOLFOX from 09/22/2018-12/29/2018. -CT CAP on 01/10/2019 showed increase in size of dominant hepatic lesion near the dome measuring 4.7 cm, previously 2.6 cm.  Other lesions in the liver are stable.  No new lesions seen.  No lesions in the lungs. 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  MSI high was not detected. -3 cycles of gemcitabine (750 mg/m2) and cisplatin (20 mg/m2) day 1 and day 15 from 02/10/2019 through 03/31/2019. -1 unit PRBC on 04/14/2019 for hemoglobin 7.7. -Cycle 4-day 1 on 05/12/2019.  Cisplatin was held on that day because of elevated creatinine of 1.42. -We reviewed labs today.  Creatinine is 1.38.  White count is 2.9 with ANC of 1500. -She will proceed with cycle 4-day 15 treatment today. -I plan to repeat scans in 2 weeks.  We will plan to give fluids tomorrow also.  2.  Weight loss: -She is maintaining her weight around 118 pounds.  3.  Genetic  testing: -Germline mutation testing was negative on 12/24/2018.  4.  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  Creatinine today is 1.38. -She will receive fluids today and tomorrow.  5.  Vitamin D deficiency: -Vitamin D level today is severely low at 8.9.  We will start her on 50,000 units weekly.     Orders placed this encounter:  Orders Placed This Encounter  Procedures  . CBC with Differential/Platelet  . Comprehensive metabolic panel  . Magnesium  . Lactate dehydrogenase  . Cancer antigen 19-9      Derek Jack, MD [place] [phone]

## 2019-05-30 NOTE — Patient Instructions (Addendum)
[place] at [facility] Discharge Instructions  You were seen today by Dr. Delton Coombes. He went over your recent lab results. He will have you come back tomorrow for fluids. He will see you back in 2 weeks for labs and follow up.   Thank you for choosing [place] at [facility] to provide your oncology and hematology care.  To afford each patient quality time with our provider, please arrive at least 15 minutes before your scheduled appointment time.   If you have a lab appointment with the [facility] please come in thru the  Main Entrance and check in at the main information desk  You need to re-schedule your appointment should you arrive 10 or more minutes late.  We strive to give you quality time with our providers, and arriving late affects you and other patients whose appointments are after yours.  Also, if you no show three or more times for appointments you may be dismissed from the clinic at the providers discretion.     Again, thank you for choosing [facility].  Our hope is that these requests will decrease the amount of time that you wait before being seen by our physicians.       _____________________________________________________________  Should you have questions after your visit to [facility], please contact our office at [phone] between the hours of 8:00 a.m. and 4:30 p.m.  Voicemails left after 4:00 p.m. will not be returned until the following business day.  For prescription refill requests, have your pharmacy contact our office and allow 72 hours.    Cancer Center Support Programs:   > Cancer Support Group  2nd Tuesday of the month 1pm-2pm, Journey Room

## 2019-05-30 NOTE — Patient Instructions (Signed)
[facility] at [facility] Discharge Instructions  Labs drawn from portacath today   Thank you for choosing [facility] at [facility] to provide your oncology and hematology care.  To afford each patient quality time with our provider, please arrive at least 15 minutes before your scheduled appointment time.   If you have a lab appointment with the Cancer Center please come in thru the Main Entrance and check in at the main information desk.  You need to re-schedule your appointment should you arrive 10 or more minutes late.  We strive to give you quality time with our providers, and arriving late affects you and other patients whose appointments are after yours.  Also, if you no show three or more times for appointments you may be dismissed from the clinic at the providers discretion.     Again, thank you for choosing [facility].  Our hope is that these requests will decrease the amount of time that you wait before being seen by our physicians.       _____________________________________________________________  Should you have questions after your visit to [facility], please contact our office at [phone] between the hours of 8:00 a.m. and 4:30 p.m.  Voicemails left after 4:00 p.m. will not be returned until the following business day.  For prescription refill requests, have your pharmacy contact our office and allow 72 hours.    Due to Covid, you will need to wear a mask upon entering the hospital. If you do not have a mask, a mask will be given to you at the Main Entrance upon arrival. For doctor visits, patients may have 1 support person with them. For treatment visits, patients can not have anyone with them due to social distancing guidelines and our immunocompromised population.     

## 2019-05-30 NOTE — Assessment & Plan Note (Addendum)
1.  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 - Gemcitabine and Abraxane from normal 2015 through August 2018 with progression. -Gemcitabine and Tarceva from 01/28/2017 through 10/07/2017 with progression. -21 cycles of infusional 5-FU and Onivyde from 10/21/2017-09/08/2018. -7 cycles of FOLFOX from 09/22/2018-12/29/2018. -CT CAP on 01/10/2019 showed increase in size of dominant hepatic lesion near the dome measuring 4.7 cm, previously 2.6 cm.  Other lesions in the liver are stable.  No new lesions seen.  No lesions in the lungs. 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  MSI high was not detected. -3 cycles of gemcitabine (750 mg/m2) and cisplatin (20 mg/m2) day 1 and day 15 from 02/10/2019 through 03/31/2019. -1 unit PRBC on 04/14/2019 for hemoglobin 7.7. -Cycle 4-day 1 on 05/12/2019.  Cisplatin was held on that day because of elevated creatinine of 1.42. -We reviewed labs today.  Creatinine is 1.38.  White count is 2.9 with ANC of 1500. -She will proceed with cycle 4-day 15 treatment today. -I plan to repeat scans in 2 weeks.  We will plan to give fluids tomorrow also.  2.  Weight loss: -She is maintaining her weight around 118 pounds.  3.  Genetic testing: -Germline mutation testing was negative on 12/24/2018.  4.  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  Creatinine today is 1.38. -She will receive fluids today and tomorrow.  5.  Vitamin D deficiency: -Vitamin D level today is severely low at 8.9.  We will start her on 50,000 units weekly.

## 2019-05-31 ENCOUNTER — Inpatient Hospital Stay (HOSPITAL_COMMUNITY): Payer: Medicare HMO

## 2019-05-31 VITALS — BP 162/61 | HR 60 | Temp 96.8°F | Resp 18

## 2019-05-31 DIAGNOSIS — Z5111 Encounter for antineoplastic chemotherapy: Secondary | ICD-10-CM | POA: Diagnosis not present

## 2019-05-31 DIAGNOSIS — C259 Malignant neoplasm of pancreas, unspecified: Secondary | ICD-10-CM

## 2019-05-31 DIAGNOSIS — D6481 Anemia due to antineoplastic chemotherapy: Secondary | ICD-10-CM

## 2019-05-31 DIAGNOSIS — C787 Secondary malignant neoplasm of liver and intrahepatic bile duct: Secondary | ICD-10-CM

## 2019-05-31 LAB — CANCER ANTIGEN 19-9: CA 19-9: 183 U/mL — ABNORMAL HIGH (ref 0–35)

## 2019-05-31 MED ORDER — PEGFILGRASTIM-JMDB 6 MG/0.6ML ~~LOC~~ SOSY
6.0000 mg | PREFILLED_SYRINGE | Freq: Once | SUBCUTANEOUS | Status: AC
  Administered 2019-05-31: 14:00:00 6 mg via SUBCUTANEOUS
  Filled 2019-05-31: qty 0.6

## 2019-05-31 MED ORDER — SODIUM CHLORIDE 0.9 % IV SOLN
Freq: Once | INTRAVENOUS | Status: AC
  Filled 2019-05-31: qty 1000

## 2019-05-31 MED ORDER — SODIUM CHLORIDE 0.9% FLUSH
10.0000 mL | Freq: Once | INTRAVENOUS | Status: AC | PRN
  Administered 2019-05-31: 10 mL

## 2019-05-31 MED ORDER — HEPARIN SOD (PORK) LOCK FLUSH 100 UNIT/ML IV SOLN
500.0000 [IU] | Freq: Once | INTRAVENOUS | Status: AC | PRN
  Administered 2019-05-31: 16:00:00 500 [IU]

## 2019-05-31 NOTE — Patient Instructions (Signed)
[facility] at [facility]  Discharge Instructions:   _______________________________________________________________  Thank you for choosing [facility] at [facility] to provide your oncology and hematology care.  To afford each patient quality time with our providers, please arrive at least 15 minutes before your scheduled appointment.  You need to re-schedule your appointment if you arrive 10 or more minutes late.  We strive to give you quality time with our providers, and arriving late affects you and other patients whose appointments are after yours.  Also, if you no show three or more times for appointments you may be dismissed from the clinic.  Again, thank you for choosing [facility] at [facility]. Our hope is that these requests will allow you access to exceptional care and in a timely manner. _______________________________________________________________  If you have questions after your visit, please contact our office at [phone] between the hours of 8:30 a.m. and 5:00 p.m. Voicemails left after 4:30 p.m. will not be returned until the following business day. _______________________________________________________________  For prescription refill requests, have your pharmacy contact our office. _______________________________________________________________  Recommendations made by the consultant and any test results will be sent to your referring physician. _______________________________________________________________ 

## 2019-05-31 NOTE — Progress Notes (Signed)
Patient presents today for IV fluids and Fulphila injection. Patient has no complaints of any pain today or changes since her last visit. Patient's blood pressure elevated on arrival. Patient denies any headache, blurred vision, or dizziness . Patient states she took her blood pressure medication this morning. Patient states she took Mucinex cold medication.   BP reviewed with RLockamy NP. Order received to give Fulphila injection today. Reported BP 178/64. Blood pressure recheck 170/74  Hydration given today per MD orders. Tolerated infusion without adverse affects. Vital signs stable. No complaints at this time. Discharged from clinic ambulatory. F/U with [facility] as scheduled.

## 2019-06-09 ENCOUNTER — Other Ambulatory Visit: Payer: Self-pay

## 2019-06-09 ENCOUNTER — Ambulatory Visit (HOSPITAL_COMMUNITY)
Admission: RE | Admit: 2019-06-09 | Discharge: 2019-06-09 | Disposition: A | Discharge status: Home / Self Care | Payer: Medicare HMO | Source: Ambulatory Visit | Attending: Hematology | Admitting: Hematology

## 2019-06-09 DIAGNOSIS — C259 Malignant neoplasm of pancreas, unspecified: Secondary | ICD-10-CM | POA: Diagnosis present

## 2019-06-09 DIAGNOSIS — C787 Secondary malignant neoplasm of liver and intrahepatic bile duct: Secondary | ICD-10-CM | POA: Insufficient documentation

## 2019-06-13 ENCOUNTER — Other Ambulatory Visit: Payer: Self-pay

## 2019-06-13 ENCOUNTER — Inpatient Hospital Stay (HOSPITAL_COMMUNITY): Payer: Medicare HMO | Attending: Hematology

## 2019-06-13 ENCOUNTER — Encounter (HOSPITAL_COMMUNITY): Payer: Self-pay | Admitting: Hematology

## 2019-06-13 ENCOUNTER — Inpatient Hospital Stay (HOSPITAL_COMMUNITY): Payer: Medicare HMO

## 2019-06-13 ENCOUNTER — Inpatient Hospital Stay (HOSPITAL_BASED_OUTPATIENT_CLINIC_OR_DEPARTMENT_OTHER): Payer: Medicare HMO | Admitting: Hematology

## 2019-06-13 DIAGNOSIS — Z5189 Encounter for other specified aftercare: Secondary | ICD-10-CM | POA: Diagnosis not present

## 2019-06-13 DIAGNOSIS — C252 Malignant neoplasm of tail of pancreas: Secondary | ICD-10-CM | POA: Insufficient documentation

## 2019-06-13 DIAGNOSIS — N289 Disorder of kidney and ureter, unspecified: Secondary | ICD-10-CM | POA: Diagnosis not present

## 2019-06-13 DIAGNOSIS — D6481 Anemia due to antineoplastic chemotherapy: Secondary | ICD-10-CM

## 2019-06-13 DIAGNOSIS — C787 Secondary malignant neoplasm of liver and intrahepatic bile duct: Secondary | ICD-10-CM | POA: Diagnosis not present

## 2019-06-13 DIAGNOSIS — C259 Malignant neoplasm of pancreas, unspecified: Secondary | ICD-10-CM | POA: Diagnosis not present

## 2019-06-13 DIAGNOSIS — Z5111 Encounter for antineoplastic chemotherapy: Secondary | ICD-10-CM | POA: Diagnosis present

## 2019-06-13 LAB — COMPREHENSIVE METABOLIC PANEL
ALT: 11 U/L (ref 0–44)
AST: 16 U/L (ref 15–41)
Albumin: 3.5 g/dL (ref 3.5–5.0)
Alkaline Phosphatase: 200 U/L — ABNORMAL HIGH (ref 38–126)
Anion gap: 8 (ref 5–15)
BUN: 23 mg/dL (ref 8–23)
CO2: 25 mmol/L (ref 22–32)
Calcium: 8.3 mg/dL — ABNORMAL LOW (ref 8.9–10.3)
Chloride: 107 mmol/L (ref 98–111)
Creatinine, Ser: 1.58 mg/dL — ABNORMAL HIGH (ref 0.44–1.00)
GFR calc Af Amer: 34 mL/min — ABNORMAL LOW (ref >60)
GFR calc non Af Amer: 29 mL/min — ABNORMAL LOW (ref >60)
Glucose, Bld: 114 mg/dL — ABNORMAL HIGH (ref 70–99)
Potassium: 3.9 mmol/L (ref 3.5–5.1)
Sodium: 140 mmol/L (ref 135–145)
Total Bilirubin: 0.6 mg/dL (ref 0.3–1.2)
Total Protein: 6.9 g/dL (ref 6.5–8.1)

## 2019-06-13 LAB — CBC WITH DIFFERENTIAL/PLATELET
Abs Immature Granulocytes: 0.08 10*3/uL — ABNORMAL HIGH (ref 0.00–0.07)
Basophils Absolute: 0 10*3/uL (ref 0.0–0.1)
Basophils Relative: 0 %
Eosinophils Absolute: 0.1 10*3/uL (ref 0.0–0.5)
Eosinophils Relative: 1 %
HCT: 26.5 % — ABNORMAL LOW (ref 36.0–46.0)
Hemoglobin: 8.7 g/dL — ABNORMAL LOW (ref 12.0–15.0)
Immature Granulocytes: 1 %
Lymphocytes Relative: 11 %
Lymphs Abs: 1.2 10*3/uL (ref 0.7–4.0)
MCH: 31.3 pg (ref 26.0–34.0)
MCHC: 32.8 g/dL (ref 30.0–36.0)
MCV: 95.3 fL (ref 80.0–100.0)
Monocytes Absolute: 0.9 10*3/uL (ref 0.1–1.0)
Monocytes Relative: 8 %
Neutro Abs: 8.9 10*3/uL — ABNORMAL HIGH (ref 1.7–7.7)
Neutrophils Relative %: 79 %
Platelets: 135 10*3/uL — ABNORMAL LOW (ref 150–400)
RBC: 2.78 MIL/uL — ABNORMAL LOW (ref 3.87–5.11)
RDW: 17.1 % — ABNORMAL HIGH (ref 11.5–15.5)
WBC: 11.1 10*3/uL — ABNORMAL HIGH (ref 4.0–10.5)
nRBC: 0 % (ref 0.0–0.2)

## 2019-06-13 LAB — MAGNESIUM: Magnesium: 1.9 mg/dL (ref 1.7–2.4)

## 2019-06-13 LAB — LACTATE DEHYDROGENASE: LDH: 126 U/L (ref 98–192)

## 2019-06-13 MED ORDER — SODIUM CHLORIDE 0.9 % IV SOLN
Freq: Once | INTRAVENOUS | Status: AC
  Filled 2019-06-13: qty 1000

## 2019-06-13 MED ORDER — HEPARIN SOD (PORK) LOCK FLUSH 100 UNIT/ML IV SOLN
500.0000 [IU] | Freq: Once | INTRAVENOUS | Status: AC | PRN
  Administered 2019-06-13: 12:00:00 500 [IU]

## 2019-06-13 MED ORDER — SODIUM CHLORIDE 0.9% FLUSH
10.0000 mL | Freq: Once | INTRAVENOUS | Status: AC | PRN
  Administered 2019-06-13: 09:00:00 10 mL

## 2019-06-13 NOTE — Patient Instructions (Signed)
[facility] at [facility]  Discharge Instructions:   _______________________________________________________________  Thank you for choosing [facility] at [facility] to provide your oncology and hematology care.  To afford each patient quality time with our providers, please arrive at least 15 minutes before your scheduled appointment.  You need to re-schedule your appointment if you arrive 10 or more minutes late.  We strive to give you quality time with our providers, and arriving late affects you and other patients whose appointments are after yours.  Also, if you no show three or more times for appointments you may be dismissed from the clinic.  Again, thank you for choosing [facility] at [facility]. Our hope is that these requests will allow you access to exceptional care and in a timely manner. _______________________________________________________________  If you have questions after your visit, please contact our office at [phone] between the hours of 8:30 a.m. and 5:00 p.m. Voicemails left after 4:30 p.m. will not be returned until the following business day. _______________________________________________________________  For prescription refill requests, have your pharmacy contact our office. _______________________________________________________________  Recommendations made by the consultant and any test results will be sent to your referring physician. _______________________________________________________________ 

## 2019-06-13 NOTE — Progress Notes (Signed)
Patient has been assessed, vital signs and labs have been reviewed by Dr. Delton Coombes. ANC,  LFTs, and Platelets are within treatment parameters, Creatinine is 1.58 today per Dr. Delton Coombes. HOLD Tx today and give 1 liter of fluids with electrolytes over 2 hours. Pt to return tomorrow for labs and Tx.

## 2019-06-13 NOTE — Progress Notes (Signed)
[place], [place] [address]   CLINIC:  Medical Oncology/Hematology  PCP:  Fayrene Helper, MD [address], [place] [address] [phone]   REASON FOR VISIT:  Follow-up for metastatic pancreatic cancer to the liver   BRIEF ONCOLOGIC HISTORY:  Oncology History  Pancreatic cancer metastasized to liver ([place])  03/10/2014 Initial Diagnosis   Pancreatic cancer metastasized to liver   03/22/2014 - 03/05/2016 Chemotherapy   Abraxane/Gemzar days 1, 8, every 28 days.  Day 15 was cancelled due to leukopenia and thrombocytopenia on day 15 cycle 1.   05/24/2014 Treatment Plan Change   Day 8 of cycle 3 is held with ANC of 1.1   06/05/2014 Imaging   CT C/A/P . Interval decrease in size of the pancreatic tail mass.Improved hepatic metastatic disease. No new lesions. No CT findings for metastatic disease involving the chest.   08/09/2014 Tumor Marker   CA 19-9= 33 (WNL)   10/26/2014 Imaging   MRI- Continued interval decrease in size of the hepatic metastatic lesions and no new lesions are identified. Continued decrease in size of the pancreatic tail lesion.   01/03/2015 Tumor Marker   Results for ANNAYA, BANGERT (MRN [id number]) as of 01/18/2015 12:52  01/03/2015 10:00 CA 19-9: 14    01/17/2015 Imaging   MRI- Response to therapy of hepatic metastasis.  Similar size of a pancreatic tail lesion.   03/20/2015 Imaging   MRI-L spine- Severe disc space narrowing at L2-L3, with endplate reactive changes. Large disc extrusion into the ventral epidural space,central to the RIGHT with a cephalad migrated free fragment. Additional Large disc extrusion into the retroperitone...   03/22/2015 Imaging   CT pelvis- No evidence for metastatic disease within the pelvis.   07/24/2015 Imaging   MRI abd- Continued response to therapy, with no residual detectable liver metastases. No new sites of metastatic disease in the abdomen.    08/15/2015 Code Status      She confirms desire for DNR status.   12/21/2015 Imaging   MRI liver- Severe image degradation due to motion artifact reducing diagnostic sensitivity and specificity. Reduce conspicuity of the pancreatic tail lesion suggesting further improvement. The original liver lesions have resolved.   03/05/2016 Treatment Plan Change   Chemotherapy holiday/Break after 2 years worth of treatment   04/07/2016 Imaging   CT chest- When compared to recent chest CT, new minimally displaced anterior left sixth rib fracture. Slight increase in subcarinal adenopathy   04/28/2016 Imaging   Bone density- BMD as determined from Femur Neck Right is 0.705 g/cm2 with a T-Score of -2.4. This patient is considered osteopenic according to [facility]) criteria. Compared with the prior study on 02/23/2013, the BMD of the lumbar spine/rt. femoral neck show a statistically significant decrease.    05/23/2016 Imaging   MRI abd- 1. Exam is significantly degraded by patient respiratory motion. Consider follow-up exams with CT abdomen with without contrast per pancreatic protocol. 2. Fullness in tail of pancreas is more prominent and potentially increased in size. Recommend close attention on follow-up. (Consider CT as above) 3. No explanation for back pain.   09/11/2016 Imaging   MRI pancreas- Interval progression of pancreatic tail lesion. New differential perfusion right liver with areas of heterogeneity. Underlying metastatic disease in this region not excluded.   09/11/2016 Progression   MRi in conjunction with rising CA 19-9 are indicative of relapse of disease.   01/09/2017 Progression   CT C/A/P: 2.1 x  2.9 cm lesion in the pancreatic tail and abutting the splenic hilum, corresponding to known primary pancreatic neoplasm, mildly increased.  Scattered small hypoenhancing lesions in the liver measuring up to 10 mm, approximately 8-10 in number, suspicious for hepatic metastases.  No  findings specific for metastatic disease in the chest.  Additional ancillary findings as above.    01/27/2017 - 10/20/2017 Chemotherapy   The patient had gemcitabine (GEMZAR) 1,292 mg in sodium chloride 0.9 % 250 mL chemo infusion, 800 mg/m2 = 1,292 mg, Intravenous,  Once, 9 of 12 cycles Administration: 1,292 mg (01/28/2017), 1,292 mg (02/11/2017), 1,292 mg (02/25/2017), 1,292 mg (03/11/2017), 1,292 mg (03/25/2017), 1,292 mg (04/08/2017), 1,292 mg (04/22/2017), 1,292 mg (04/29/2017), 1,292 mg (05/20/2017), 1,292 mg (06/03/2017), 1,292 mg (06/24/2017), 1,292 mg (07/08/2017), 1,292 mg (07/29/2017), 1,292 mg (08/12/2017), 1,292 mg (08/26/2017), 1,292 mg (09/09/2017), 1,292 mg (09/23/2017), 1,292 mg (10/07/2017)  for chemotherapy treatment.    10/21/2017 - 09/22/2018 Chemotherapy   The patient had leucovorin 700 mg in dextrose 5 % 250 mL infusion, 644 mg, Intravenous,  Once, 22 of 22 cycles Administration: 700 mg (10/21/2017), 700 mg (11/04/2017), 700 mg (11/18/2017), 700 mg (12/02/2017), 700 mg (12/16/2017), 700 mg (12/30/2017), 700 mg (01/13/2018), 700 mg (01/27/2018), 700 mg (02/10/2018), 700 mg (02/24/2018), 700 mg (03/10/2018), 700 mg (03/24/2018), 700 mg (04/28/2018), 700 mg (05/17/2018), 700 mg (05/31/2018), 700 mg (06/16/2018), 700 mg (06/30/2018), 700 mg (07/21/2018), 700 mg (08/04/2018), 700 mg (08/25/2018), 700 mg (09/08/2018) ondansetron (ZOFRAN) 8 mg in sodium chloride 0.9 % 50 mL IVPB, , Intravenous,  Once, 21 of 21 cycles Administration:  (11/04/2017),  (01/27/2018),  (02/10/2018),  (02/24/2018),  (03/10/2018),  (03/24/2018),  (04/28/2018),  (05/17/2018),  (05/31/2018),  (06/16/2018),  (06/30/2018),  (07/21/2018),  (08/04/2018),  (08/25/2018),  (09/08/2018) fluorouracil (ADRUCIL) 3,850 mg in sodium chloride 0.9 % 73 mL chemo infusion, 2,400 mg/m2 = 3,850 mg, Intravenous, 1 Day/Dose, 22 of 22 cycles Dose modification: 1,920 mg/m2 (original dose 2,400 mg/m2, Cycle 2, Reason: Provider Judgment, Comment: mucisitis) Administration: 3,850 mg  (10/21/2017), 3,100 mg (11/04/2017), 3,100 mg (11/18/2017), 3,100 mg (12/02/2017), 3,100 mg (12/16/2017), 3,100 mg (12/30/2017), 3,100 mg (01/13/2018), 3,100 mg (01/27/2018), 3,100 mg (02/10/2018), 3,100 mg (02/24/2018), 3,100 mg (03/10/2018), 3,100 mg (03/24/2018), 3,100 mg (04/28/2018), 3,100 mg (05/17/2018), 3,100 mg (05/31/2018), 3,100 mg (06/16/2018), 3,100 mg (06/30/2018), 3,100 mg (07/21/2018), 3,100 mg (08/04/2018), 3,100 mg (08/25/2018), 3,100 mg (09/08/2018) irinotecan LIPOSOME (ONIVYDE) 81.7 mg in sodium chloride 0.9 % 500 mL chemo infusion, 50 mg/m2 = 81.7 mg (100 % of original dose 50 mg/m2), Intravenous, Once, 22 of 22 cycles Dose modification: 50 mg/m2 (original dose 50 mg/m2, Cycle 1, Reason: Provider Judgment), 50 mg/m2 (original dose 50 mg/m2, Cycle 2, Reason: Provider Judgment) Administration: 81.7 mg (10/21/2017), 81.7 mg (11/04/2017), 81.7 mg (11/18/2017), 81.7 mg (12/02/2017), 81.7 mg (12/16/2017), 81.7 mg (12/30/2017), 81.7 mg (01/13/2018), 81.7 mg (01/27/2018), 81.7 mg (02/10/2018), 81.7 mg (02/24/2018), 81.7 mg (03/10/2018), 81.7 mg (03/24/2018), 81.7 mg (04/28/2018), 81.7 mg (05/17/2018), 81.7 mg (05/31/2018), 81.7 mg (06/16/2018), 81.7 mg (06/30/2018), 81.7 mg (07/21/2018), 81.7 mg (08/04/2018), 81.7 mg (08/25/2018), 81.7 mg (09/08/2018)  for chemotherapy treatment.    09/22/2018 - 01/11/2019 Chemotherapy   The patient had palonosetron (ALOXI) injection 0.25 mg, 0.25 mg, Intravenous,  Once, 7 of 8 cycles Administration: 0.25 mg (09/22/2018), 0.25 mg (10/11/2018), 0.25 mg (10/25/2018), 0.25 mg (12/01/2018), 0.25 mg (12/15/2018), 0.25 mg (11/17/2018), 0.25 mg (12/29/2018) pegfilgrastim (NEULASTA) injection 6 mg, 6 mg, Subcutaneous, Once, 5 of 5 cycles Administration: 6 mg (10/13/2018), 6 mg (10/27/2018), 6 mg (12/03/2018),  6 mg (12/17/2018), 6 mg (11/19/2018) pegfilgrastim-cbqv (UDENYCA) injection 6 mg, 6 mg, Subcutaneous, Once, 1 of 2 cycles Administration: 6 mg (12/31/2018) leucovorin 628 mg in dextrose 5 % 250 mL infusion, 400 mg/m2 =  628 mg, Intravenous,  Once, 7 of 8 cycles Administration: 628 mg (09/22/2018), 628 mg (10/11/2018), 628 mg (10/25/2018), 628 mg (12/01/2018), 628 mg (12/15/2018), 628 mg (11/17/2018), 628 mg (12/29/2018) oxaliplatin (ELOXATIN) 100 mg in dextrose 5 % 500 mL chemo infusion, 65 mg/m2 = 100 mg (100 % of original dose 65 mg/m2), Intravenous,  Once, 7 of 8 cycles Dose modification: 65 mg/m2 (original dose 65 mg/m2, Cycle 1, Reason: Provider Judgment) Administration: 100 mg (09/22/2018), 100 mg (10/11/2018), 100 mg (10/25/2018), 100 mg (12/01/2018), 100 mg (12/15/2018), 100 mg (11/17/2018), 100 mg (12/29/2018) fluorouracil (ADRUCIL) chemo injection 500 mg, 320 mg/m2 = 500 mg (100 % of original dose 320 mg/m2), Intravenous,  Once, 7 of 8 cycles Dose modification: 320 mg/m2 (original dose 320 mg/m2, Cycle 1, Reason: Provider Judgment) Administration: 500 mg (09/22/2018), 500 mg (10/11/2018), 500 mg (10/25/2018), 500 mg (12/01/2018), 500 mg (12/15/2018), 500 mg (11/17/2018), 500 mg (12/29/2018) fluorouracil (ADRUCIL) 3,000 mg in sodium chloride 0.9 % 90 mL chemo infusion, 1,920 mg/m2 = 3,000 mg (100 % of original dose 1,920 mg/m2), Intravenous, 1 Day/Dose, 7 of 8 cycles Dose modification: 1,920 mg/m2 (original dose 1,920 mg/m2, Cycle 1, Reason: Provider Judgment) Administration: 3,000 mg (09/22/2018), 3,000 mg (10/11/2018), 3,000 mg (10/25/2018), 3,000 mg (12/01/2018), 3,000 mg (12/15/2018), 3,000 mg (11/17/2018), 3,000 mg (12/29/2018)  for chemotherapy treatment.    12/24/2018 Genetic Testing   Negative genetic testing on the common hereditary cancer panel.  The Common Hereditary Gene Panel offered by [facility] includes sequencing and/or deletion duplication testing of the following 48 genes: APC, ATM, AXIN2, BARD1, BMPR1A, BRCA1, BRCA2, BRIP1, CDH1, CDK4, CDKN2A (p14ARF), CDKN2A (p16INK4a), CHEK2, CTNNA1, DICER1, EPCAM (Deletion/duplication testing only), GREM1 (promoter region deletion/duplication testing only), KIT, MEN1, MLH1, MSH2, MSH3, MSH6,  MUTYH, NBN, NF1, NHTL1, PALB2, PDGFRA, PMS2, POLD1, POLE, PTEN, RAD50, RAD51C, RAD51D, RNF43, SDHB, SDHC, SDHD, SMAD4, SMARCA4. STK11, TP53, TSC1, TSC2, and VHL.  The following genes were evaluated for sequence changes only: SDHA and HOXB13 c.251G>A variant only. The report date is December 24, 2018.  DICER1 VUS identified.  This will not change medical decision making.   02/10/2019 -  Chemotherapy   The patient had palonosetron (ALOXI) injection 0.25 mg, 0.25 mg, Intravenous,  Once, 4 of 5 cycles Administration: 0.25 mg (02/10/2019), 0.25 mg (02/24/2019), 0.25 mg (03/10/2019), 0.25 mg (03/17/2019), 0.25 mg (03/31/2019), 0.25 mg (04/14/2019), 0.25 mg (05/12/2019), 0.25 mg (05/30/2019) pegfilgrastim-jmdb (FULPHILA) injection 6 mg, 6 mg, Subcutaneous,  Once, 4 of 5 cycles Administration: 6 mg (02/25/2019), 6 mg (03/18/2019), 6 mg (05/31/2019) CISplatin (PLATINOL) 31 mg in sodium chloride 0.9 % 250 mL chemo infusion, 20 mg/m2 = 31 mg (80 % of original dose 25 mg/m2), Intravenous,  Once, 4 of 5 cycles Dose modification: 20 mg/m2 (80 % of original dose 25 mg/m2, Cycle 1, Reason: Other (see comments), Comment: renal dysfunction) Administration: 31 mg (02/10/2019), 31 mg (02/24/2019), 31 mg (03/10/2019), 31 mg (03/17/2019), 31 mg (03/31/2019), 31 mg (04/14/2019), 31 mg (05/30/2019) gemcitabine (GEMZAR) 1,140 mg in sodium chloride 0.9 % 250 mL chemo infusion, 750 mg/m2 = 1,140 mg (75 % of original dose 1,000 mg/m2), Intravenous,  Once, 4 of 5 cycles Dose modification: 750 mg/m2 (75 % of original dose 1,000 mg/m2, Cycle 1, Reason: Provider Judgment) Administration: 1,140 mg (02/10/2019), 1,140 mg (02/24/2019), 1,140  mg (03/10/2019), 1,140 mg (03/17/2019), 1,140 mg (03/31/2019), 1,140 mg (04/14/2019), 1,140 mg (05/12/2019), 1,140 mg (05/30/2019) fosaprepitant (EMEND) 150 mg, dexamethasone (DECADRON) 12 mg in sodium chloride 0.9 % 145 mL IVPB, , Intravenous,  Once, 4 of 5 cycles Administration:  (02/10/2019),  (02/24/2019),   (03/10/2019),  (03/17/2019),  (03/31/2019),  (04/14/2019),  (05/12/2019),  (05/30/2019)  for chemotherapy treatment.       CANCER STAGING: Cancer Staging Pancreatic cancer metastasized to liver [facility]) Staging form: Pancreas, AJCC 7th Edition - Clinical: Stage IV (T3, N1, M1) - Signed by Baird Cancer, PA-C on 03/16/2014 - Pathologic: No stage assigned - Unsigned    INTERVAL HISTORY:  Ms. Cales 86 y.o. female seen for follow-up of metastatic pancreatic cancer and toxicity assessment prior to cycle 5.  She has lost 4 pounds since last 2 weeks.  Appetite and energy levels are 75%.  She reports that she has been eating well.  She is unable to drink boost or Ensure.  She is accompanied by her son today.  Denies any back pain or bilateral rib pain.  She has constipation alternating with diarrhea which is stable.  REVIEW OF SYSTEMS:  Review of Systems  Gastrointestinal: Positive for constipation and diarrhea.  Neurological: Positive for headaches.  All other systems reviewed and are negative.    PAST MEDICAL/SURGICAL HISTORY:  Past Medical History:  Diagnosis Date  . Anemia due to antineoplastic chemotherapy 09/12/2015   Started Aranesp 500 mcg on 09/12/2015  . Anxiety   . Chronic diarrhea   . Depression   . DNR (do not resuscitate) 08/16/2015  . Erosive esophagitis   . Family history of breast cancer in female   . Family history of prostate cancer   . GERD (gastroesophageal reflux disease)   . Hx of adenomatous colonic polyps    tubular adenomas, last found in 2008  . Hyperplastic colon polyp 03/19/10   tcs by Dr. Gala Romney  . Hypertension 20 years   . Kidney stone    hx/ crushed   . Opioid contract exists 04/18/2015   With Dr. Moshe Cipro  . Pancreatic cancer ([place]) 02/2014  . Pancreatic cancer metastasized to liver ([place]) 03/10/2014  . Schatzki's ring 08/26/10   Last dilated on EGD by Dr. Trevor Iha [facility], linear gastric erosions, BI hemigastrectomy   Past Surgical History:  Procedure  Laterality Date  . BREAST LUMPECTOMY Left   . bunion removal     from both feet   . CHOLECYSTECTOMY  1965   . COLONOSCOPY  03/19/2010   DR Gala Romney,, normal TI, pancolonic diverticula, random colon bx neg., hyperplastic polyps removed  . ESOPHAGOGASTRODUODENOSCOPY  11/22/2003   DR Gala Romney, erosive RE, Billroth I  . ESOPHAGOGASTRODUODENOSCOPY  08/26/10   Dr. Gala Romney- moderate severe ERE, Scahtzki ring s/p dilation, Billroth I, linear gastric erosions, bx-gastric xanthelasma  . LEFT SHOULDER SURGERY  2009   DR HARRISON  . ORIF ANKLE FRACTURE Right 05/22/2013   Procedure: OPEN REDUCTION INTERNAL FIXATION (ORIF) RIGHT ANKLE FRACTURE;  Surgeon: Sanjuana Kava, MD;  Location: [facility];  Service: Orthopedics;  Laterality: Right;  . stomach ulcer  50 years ago    had some of her stomach removed      SOCIAL HISTORY:  Social History   Socioeconomic History  . Marital status: Widowed    Spouse name: Not on file  . Number of children: 2  . Years of education: 73  . Highest education level: 12th grade  Occupational History  . Occupation: retired from Teacher, adult education  Employer: RETIRED  Tobacco Use  . Smoking status: Current Some Day Smoker    Packs/day: 0.50    Years: 60.00    Pack years: 30.00    Types: Cigarettes    Last attempt to quit: 09/09/2016    Years since quitting: 2.7  . Smokeless tobacco: Never Used  Substance and Sexual Activity  . Alcohol use: No  . Drug use: No  . Sexual activity: Not Currently  Other Topics Concern  . Not on file  Social History Narrative   Lives with son alone    Social Determinants of Health   Financial Resource Strain:   . Difficulty of Paying Living Expenses: Not on file  Food Insecurity:   . Worried About Charity fundraiser in the Last Year: Not on file  . Ran Out of Food in the Last Year: Not on file  Transportation Needs:   . Lack of Transportation (Medical): Not on file  . Lack of Transportation (Non-Medical): Not on file  Physical Activity:   . Days  of Exercise per Week: Not on file  . Minutes of Exercise per Session: Not on file  Stress:   . Feeling of Stress : Not on file  Social Connections:   . Frequency of Communication with Friends and Family: Not on file  . Frequency of Social Gatherings with Friends and Family: Not on file  . Attends Religious Services: Not on file  . Active Member of Clubs or Organizations: Not on file  . Attends Archivist Meetings: Not on file  . Marital Status: Not on file  Intimate Partner Violence:   . Fear of Current or Ex-Partner: Not on file  . Emotionally Abused: Not on file  . Physically Abused: Not on file  . Sexually Abused: Not on file    FAMILY HISTORY:  Family History  Problem Relation Age of Onset  . Hypertension Mother   . Kidney failure Brother        on dialysis  . Diabetes Sister   . Diabetes Sister   . Hypertension Father   . Kidney failure Sister   . Kidney Stones Brother   . Breast cancer Son 52  . Gout Son 71  . Gout Nephew 57  . Liver disease Neg Hx   . Colon cancer Neg Hx     CURRENT MEDICATIONS:  Outpatient Encounter Medications as of 06/13/2019  Medication Sig  . amLODipine (NORVASC) 10 MG tablet TAKE 1 TABLET DAILY  . CISPLATIN IV Inject into the vein. Days 1, 8 every 21 days  . clonazePAM (KLONOPIN) 0.5 MG tablet Take 1 tablet (0.5 mg total) by mouth at bedtime.  . cycloSPORINE (RESTASIS) 0.05 % ophthalmic emulsion Place 1 drop into both eyes 2 (two) times daily.  . ergocalciferol (VITAMIN D2) 1.25 MG (50000 UT) capsule Take 1 capsule (50,000 Units total) by mouth once a week.  . fluorometholone (FML) 0.1 % ophthalmic suspension INSTILL 1 DROP INTO EACH EYE THREE TIMES DAILY FOR 14 DAYS  . fluticasone (FLONASE) 50 MCG/ACT nasal spray Place 1 spray into both nostrils daily.  Marland Kitchen GEMCITABINE HCL IV Inject into the vein. Days 1, 8 every 21 days  . metoprolol tartrate (LOPRESSOR) 25 MG tablet TAKE 1 TABLET DAILY FOR BLOOD PRESSURE (DOSE REDUCTION)  .  mirtazapine (REMERON) 15 MG tablet Take 15 mg by mouth at bedtime.  Marland Kitchen MYRBETRIQ 50 MG TB24 tablet TAKE 1 TABLET DAILY  . pantoprazole (PROTONIX) 20 MG tablet TAKE 1 TABLET DAILY  .  senna-docusate (SENOKOT-S) 8.6-50 MG tablet Take 1 tablet by mouth daily.  Marland Kitchen acetaminophen (TYLENOL) 500 MG tablet Take 500 mg by mouth every 6 (six) hours as needed for mild pain or moderate pain.   . clobetasol (TEMOVATE) 0.05 % external solution APP AA ON SCALP BID PRN NOT TO FACE GROIN OR UNDERARMS  . lidocaine (XYLOCAINE) 2 % solution RINSE WITH 5ML AS NEEDED TO RELIEVE MOUTH PAIN.  Marland Kitchen lidocaine-prilocaine (EMLA) cream Apply 1 application topically as needed. Apply to portacath site as needed (Patient not taking: Reported on 05/30/2019)  . loperamide (IMODIUM A-D) 2 MG tablet Take 2 mg by mouth 4 (four) times daily as needed for diarrhea or loose stools.  . [DISCONTINUED] prochlorperazine (COMPAZINE) 10 MG tablet Take 1 tablet (10 mg total) by mouth every 6 (six) hours as needed (Nausea or vomiting).   No facility-administered encounter medications on file as of 06/13/2019.    ALLERGIES:  Allergies  Allergen Reactions  . Iohexol Hives and Shortness Of Breath    **Patient can not have IV contrast, pt has breakthrough reaction even after 13 hour premeds** Do not give IV contrast PATIENT HAD TO BE TAKEN TO ED, C/O WHELPS, HIVES, DIFFICULTY BREATHING. PATIENT GIVEN IV CONTRAST AFTER 13 HOUR PRE MEDS ON 01/09/2017 WITH NO REACTION NOTED. On 10/19/17 patient had reaction despite premedication.    Marland Kitchen Aciphex [Rabeprazole Sodium] Other (See Comments)    unknown  . Amlodipine Besylate-Valsartan     Rash to high-dose (5/320)  . Esomeprazole Magnesium   . Omeprazole Other (See Comments)    Patient states "medication didn't work"  . Ciprofloxacin Rash  . Penicillins Swelling and Rash    Has patient had a PCN reaction causing immediate rash, facial/tongue/throat swelling, SOB or lightheadedness with hypotension: Yes Has  patient had a PCN reaction causing severe rash involving mucus membranes or skin necrosis: Yes Has patient had a PCN reaction that required hospitalization: No Has patient had a PCN reaction occurring within the last 10 years: yes If all of the above answers are "NO", then may proceed with Cephalosporin use.   [name] [Benzonatate] Itching    Full head, face, neck, and chest itching.      PHYSICAL EXAM:  ECOG Performance status: 1  Vitals:   06/13/19 0746  BP: (!) 139/49  Pulse: (!) 56  Resp: 18  Temp: (!) 97.3 F (36.3 C)  SpO2: 99%   Filed Weights   06/13/19 0746  Weight: 113 lb 11.2 oz (51.6 kg)    Physical Exam Vitals reviewed.  Constitutional:      Appearance: Normal appearance.  Cardiovascular:     Rate and Rhythm: Normal rate and regular rhythm.     Heart sounds: Normal heart sounds.  Pulmonary:     Effort: Pulmonary effort is normal.     Breath sounds: Normal breath sounds.  Abdominal:     General: There is no distension.     Palpations: Abdomen is soft. There is no mass.  Musculoskeletal:        General: No swelling.  Skin:    General: Skin is warm.  Neurological:     General: No focal deficit present.     Mental Status: She is alert and oriented to person, place, and time.  Psychiatric:        Mood and Affect: Mood normal.        Behavior: Behavior normal.      LABORATORY DATA:  I have reviewed the labs as listed.  CBC    Component Value Date/Time   WBC 11.1 (H) 06/13/2019 0811   RBC 2.78 (L) 06/13/2019 0811   HGB 8.7 (L) 06/13/2019 0811   HCT 26.5 (L) 06/13/2019 0811   PLT 135 (L) 06/13/2019 0811   MCV 95.3 06/13/2019 0811   MCH 31.3 06/13/2019 0811   MCHC 32.8 06/13/2019 0811   RDW 17.1 (H) 06/13/2019 0811   LYMPHSABS 1.2 06/13/2019 0811   MONOABS 0.9 06/13/2019 0811   EOSABS 0.1 06/13/2019 0811   BASOSABS 0.0 06/13/2019 0811   CMP Latest Ref Rng & Units 06/13/2019 05/30/2019 05/12/2019  Glucose 70 - 99 mg/dL 114(H) 92 93    BUN 8 - 23 mg/dL 23 31(H) 31(H)  Creatinine 0.44 - 1.00 mg/dL 1.58(H) 1.38(H) 1.42(H)  Sodium 135 - 145 mmol/L 140 137 139  Potassium 3.5 - 5.1 mmol/L 3.9 3.8 3.9  Chloride 98 - 111 mmol/L 107 106 105  CO2 22 - 32 mmol/L '25 23 24  '$ Calcium 8.9 - 10.3 mg/dL 8.3(L) 8.4(L) 8.3(L)  Total Protein 6.5 - 8.1 g/dL 6.9 7.1 6.9  Total Bilirubin 0.3 - 1.2 mg/dL 0.6 0.6 0.7  Alkaline Phos 38 - 126 U/L 200(H) 189(H) 193(H)  AST 15 - 41 U/L '16 18 29  '$ ALT 0 - 44 U/L '11 13 16       '$ DIAGNOSTIC IMAGING:  I have independently reviewed the scans and discussed with the patient.   I have reviewed Venita Lick LPN's note and agree with the documentation.  I personally performed a face-to-face visit, made revisions and my assessment and plan is as follows.    ASSESSMENT & PLAN:   Pancreatic cancer metastasized to liver ([place]) 1.  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 - Gemcitabine and Abraxane from normal 2015 through August 2018 with progression. -Gemcitabine and Tarceva from 01/28/2017 through 10/07/2017 with progression. -21 cycles of infusional 5-FU and Onivyde from 10/21/2017-09/08/2018. -7 cycles of FOLFOX from 09/22/2018-12/29/2018. -CT CAP on 01/10/2019 showed increase in size of dominant hepatic lesion near the dome measuring 4.7 cm, previously 2.6 cm.  Other lesions in the liver are stable.  No new lesions seen.  No lesions in the lungs. 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  MSI high was not detected. -4 cycles of gemcitabine (750 mg/m2) and cisplatin (20 mg/m2) day 1 and day 15 from 02/10/2019 through 05/12/2019. -She has tolerated last cycle very well.  Denies any tingling or numbness or ringing in the ears. -We reviewed results of the CT scan of the chest, abdomen and pelvis on 06/09/2019.  I have compared with the prior scans.  The right hepatic lobe lesion measures 4 x 3.2 cm, previously  3.9 x 3.6 cm.  Segment 2 lesion measures 2.4 x 1.9 cm.  Left and right omental nodules increased in size by 5 mm.  However study was limited because of noncontrast examination. -Her CA 19-9 has decreased from 376 (02/10/2019) to 183 (05/30/2019).  She is also functioning well. -Hence we have recommended to continue same treatment for 2 to 3 months and repeat her scans again. -We have reviewed her labs.  Creatinine is high at 1.58.  We will hold her treatment today.  We will give her hydration and repeat her creatinine tomorrow. -If it improves to her baseline, we will proceed with next cycle of treatment.  2.  Weight loss: -She lost 4 pounds in  2 weeks.  She reports that she has been eating well.  She has not been drinking any boost or Ensure as she is allergic to it.  3.  Genetic testing: -Germline mutation testing was negative on 12/24/2018.  4.  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  Creatinine increased to 1.58 today.  Electrolytes are normal. -We will give her hydration today.  We will hold her treatment today.  We will repeat creatinine tomorrow.  5.  Vitamin D deficiency: -We have started her on 50,000 units weekly of vitamin D3.  6.  Normocytic anemia: -This is from combination of chemotherapy and CKD. -Last transfusion was on 04/14/2019.  Hemoglobin today is 8.7.  She does not have any bleeding issues.     Orders placed this encounter:  No orders of the defined types were placed in this encounter.  Total time spent is 30 minutes with more than 50% of the time spent face-to-face reviewing and discussing scans, treatment plan, counseling and coordination of care.   Derek Jack, MD [place] [phone]

## 2019-06-13 NOTE — Patient Instructions (Signed)
[place] at [facility] Discharge Instructions  You were seen today by Dr. Delton Coombes. He went over your recent lab and scan results. He will see you back tomorrow for labs, treatment and follow up.   Thank you for choosing [place] at [facility] to provide your oncology and hematology care.  To afford each patient quality time with our provider, please arrive at least 15 minutes before your scheduled appointment time.   If you have a lab appointment with the [facility] please come in thru the  Main Entrance and check in at the main information desk  You need to re-schedule your appointment should you arrive 10 or more minutes late.  We strive to give you quality time with our providers, and arriving late affects you and other patients whose appointments are after yours.  Also, if you no show three or more times for appointments you may be dismissed from the clinic at the providers discretion.     Again, thank you for choosing [facility].  Our hope is that these requests will decrease the amount of time that you wait before being seen by our physicians.       _____________________________________________________________  Should you have questions after your visit to [facility], please contact our office at [phone] between the hours of 8:00 a.m. and 4:30 p.m.  Voicemails left after 4:00 p.m. will not be returned until the following business day.  For prescription refill requests, have your pharmacy contact our office and allow 72 hours.    Cancer Center Support Programs:   > Cancer Support Group  2nd Tuesday of the month 1pm-2pm, Journey Room

## 2019-06-13 NOTE — Assessment & Plan Note (Signed)
1.  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 - Gemcitabine and Abraxane from normal 2015 through August 2018 with progression. -Gemcitabine and Tarceva from 01/28/2017 through 10/07/2017 with progression. -21 cycles of infusional 5-FU and Onivyde from 10/21/2017-09/08/2018. -7 cycles of FOLFOX from 09/22/2018-12/29/2018. -CT CAP on 01/10/2019 showed increase in size of dominant hepatic lesion near the dome measuring 4.7 cm, previously 2.6 cm.  Other lesions in the liver are stable.  No new lesions seen.  No lesions in the lungs. 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  MSI high was not detected. -4 cycles of gemcitabine (750 mg/m2) and cisplatin (20 mg/m2) day 1 and day 15 from 02/10/2019 through 05/12/2019. -She has tolerated last cycle very well.  Denies any tingling or numbness or ringing in the ears. -We reviewed results of the CT scan of the chest, abdomen and pelvis on 06/09/2019.  I have compared with the prior scans.  The right hepatic lobe lesion measures 4 x 3.2 cm, previously 3.9 x 3.6 cm.  Segment 2 lesion measures 2.4 x 1.9 cm.  Left and right omental nodules increased in size by 5 mm.  However study was limited because of noncontrast examination. -Her CA 19-9 has decreased from 376 (02/10/2019) to 183 (05/30/2019).  She is also functioning well. -Hence we have recommended to continue same treatment for 2 to 3 months and repeat her scans again. -We have reviewed her labs.  Creatinine is high at 1.58.  We will hold her treatment today.  We will give her hydration and repeat her creatinine tomorrow. -If it improves to her baseline, we will proceed with next cycle of treatment.  2.  Weight loss: -She lost 4 pounds in 2 weeks.  She reports that she has been eating well.  She has not been drinking any boost or Ensure as she is allergic to it.  3.  Genetic testing: -Germline mutation testing was  negative on 12/24/2018.  4.  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  Creatinine increased to 1.58 today.  Electrolytes are normal. -We will give her hydration today.  We will hold her treatment today.  We will repeat creatinine tomorrow.  5.  Vitamin D deficiency: -We have started her on 50,000 units weekly of vitamin D3.  6.  Normocytic anemia: -This is from combination of chemotherapy and CKD. -Last transfusion was on 04/14/2019.  Hemoglobin today is 8.7.  She does not have any bleeding issues.

## 2019-06-13 NOTE — Progress Notes (Signed)
Labs reviewed with MD today . No treatment today due to creatinine. Will give hydration fluids per orders and reschedule treatment tomorrow.    Patient tolerated it well without problems. Vitals stable and discharged home from clinic ambulatory. Follow up as scheduled.

## 2019-06-14 ENCOUNTER — Encounter (HOSPITAL_COMMUNITY): Payer: Self-pay

## 2019-06-14 ENCOUNTER — Ambulatory Visit (HOSPITAL_COMMUNITY): Payer: Medicare HMO | Admitting: Hematology

## 2019-06-14 ENCOUNTER — Inpatient Hospital Stay (HOSPITAL_COMMUNITY): Payer: Medicare HMO

## 2019-06-14 VITALS — BP 141/65 | HR 63 | Temp 97.7°F | Resp 18 | Wt 114.6 lb

## 2019-06-14 DIAGNOSIS — C787 Secondary malignant neoplasm of liver and intrahepatic bile duct: Secondary | ICD-10-CM

## 2019-06-14 DIAGNOSIS — C259 Malignant neoplasm of pancreas, unspecified: Secondary | ICD-10-CM

## 2019-06-14 DIAGNOSIS — Z5111 Encounter for antineoplastic chemotherapy: Secondary | ICD-10-CM | POA: Diagnosis not present

## 2019-06-14 LAB — CBC WITH DIFFERENTIAL/PLATELET
Abs Immature Granulocytes: 0.11 10*3/uL — ABNORMAL HIGH (ref 0.00–0.07)
Basophils Absolute: 0 10*3/uL (ref 0.0–0.1)
Basophils Relative: 0 %
Eosinophils Absolute: 0.2 10*3/uL (ref 0.0–0.5)
Eosinophils Relative: 1 %
HCT: 26.9 % — ABNORMAL LOW (ref 36.0–46.0)
Hemoglobin: 8.6 g/dL — ABNORMAL LOW (ref 12.0–15.0)
Immature Granulocytes: 1 %
Lymphocytes Relative: 12 %
Lymphs Abs: 1.4 10*3/uL (ref 0.7–4.0)
MCH: 30.9 pg (ref 26.0–34.0)
MCHC: 32 g/dL (ref 30.0–36.0)
MCV: 96.8 fL (ref 80.0–100.0)
Monocytes Absolute: 0.8 10*3/uL (ref 0.1–1.0)
Monocytes Relative: 6 %
Neutro Abs: 9.6 10*3/uL — ABNORMAL HIGH (ref 1.7–7.7)
Neutrophils Relative %: 80 %
Platelets: 163 10*3/uL (ref 150–400)
RBC: 2.78 MIL/uL — ABNORMAL LOW (ref 3.87–5.11)
RDW: 17.2 % — ABNORMAL HIGH (ref 11.5–15.5)
WBC: 12.1 10*3/uL — ABNORMAL HIGH (ref 4.0–10.5)
nRBC: 0 % (ref 0.0–0.2)

## 2019-06-14 LAB — COMPREHENSIVE METABOLIC PANEL
ALT: 12 U/L (ref 0–44)
AST: 16 U/L (ref 15–41)
Albumin: 3.5 g/dL (ref 3.5–5.0)
Alkaline Phosphatase: 197 U/L — ABNORMAL HIGH (ref 38–126)
Anion gap: 8 (ref 5–15)
BUN: 19 mg/dL (ref 8–23)
CO2: 24 mmol/L (ref 22–32)
Calcium: 8.2 mg/dL — ABNORMAL LOW (ref 8.9–10.3)
Chloride: 108 mmol/L (ref 98–111)
Creatinine, Ser: 1.44 mg/dL — ABNORMAL HIGH (ref 0.44–1.00)
GFR calc Af Amer: 37 mL/min — ABNORMAL LOW (ref >60)
GFR calc non Af Amer: 32 mL/min — ABNORMAL LOW (ref >60)
Glucose, Bld: 126 mg/dL — ABNORMAL HIGH (ref 70–99)
Potassium: 3.9 mmol/L (ref 3.5–5.1)
Sodium: 140 mmol/L (ref 135–145)
Total Bilirubin: 0.4 mg/dL (ref 0.3–1.2)
Total Protein: 7.1 g/dL (ref 6.5–8.1)

## 2019-06-14 LAB — MAGNESIUM: Magnesium: 2.2 mg/dL (ref 1.7–2.4)

## 2019-06-14 LAB — CANCER ANTIGEN 19-9: CA 19-9: 187 U/mL — ABNORMAL HIGH (ref 0–35)

## 2019-06-14 MED ORDER — SODIUM CHLORIDE 0.9% FLUSH
10.0000 mL | INTRAVENOUS | Status: DC | PRN
  Administered 2019-06-14: 10 mL

## 2019-06-14 MED ORDER — PALONOSETRON HCL INJECTION 0.25 MG/5ML
0.2500 mg | Freq: Once | INTRAVENOUS | Status: AC
  Administered 2019-06-14: 0.25 mg via INTRAVENOUS
  Filled 2019-06-14: qty 5

## 2019-06-14 MED ORDER — SODIUM CHLORIDE 0.9 % IV SOLN
Freq: Once | INTRAVENOUS | Status: AC
  Filled 2019-06-14: qty 5

## 2019-06-14 MED ORDER — SODIUM CHLORIDE 0.9 % IV SOLN
20.0000 mg/m2 | Freq: Once | INTRAVENOUS | Status: AC
  Administered 2019-06-14: 31 mg via INTRAVENOUS
  Filled 2019-06-14: qty 31

## 2019-06-14 MED ORDER — HEPARIN SOD (PORK) LOCK FLUSH 100 UNIT/ML IV SOLN
500.0000 [IU] | Freq: Once | INTRAVENOUS | Status: AC | PRN
  Administered 2019-06-14: 14:00:00 500 [IU]

## 2019-06-14 MED ORDER — SODIUM CHLORIDE 0.9 % IV SOLN
750.0000 mg/m2 | Freq: Once | INTRAVENOUS | Status: AC
  Administered 2019-06-14: 12:00:00 1140 mg via INTRAVENOUS
  Filled 2019-06-14: qty 26.3

## 2019-06-14 MED ORDER — POTASSIUM CHLORIDE 2 MEQ/ML IV SOLN
Freq: Once | INTRAVENOUS | Status: AC
  Filled 2019-06-14: qty 10

## 2019-06-14 MED ORDER — SODIUM CHLORIDE 0.9 % IV SOLN: Freq: Once | INTRAVENOUS | Status: AC

## 2019-06-14 NOTE — Progress Notes (Signed)
0930 Labs reviewed with Dr. Delton Coombes and pt approved for Cisplatin and Gemzar infusions with IV hydration added for tomorrow per MD                                                                             Alecia Lemming tolerated chemo tx well without complaints or incident. Port left accessed and flushed per protocol for use tomorrow. VSS upon discharge. Pt discharged self ambulatory using her cane in satisfactory condition

## 2019-06-14 NOTE — Patient Instructions (Signed)
[facility] Discharge Instructions for Patients Receiving Chemotherapy   Beginning January 23rd 2017 lab work for the [facility] will be done in the  Main lab at [facility] on 1st floor. If you have a lab appointment with the [facility] please come in thru the  Main Entrance and check in at the main information desk   Today you received the following chemotherapy agents Cisplatin and Gemzar. Follow-up as scheduled. Call clinic for any questions or concerns  To help prevent nausea and vomiting after your treatment, we encourage you to take your nausea medication   If you develop nausea and vomiting, or diarrhea that is not controlled by your medication, call the clinic.  The clinic phone number is [phone]. Office hours are Monday-Friday 8:30am-5:00pm.  BELOW ARE SYMPTOMS THAT SHOULD BE REPORTED IMMEDIATELY:  *FEVER GREATER THAN 101.0 F  *CHILLS WITH OR WITHOUT FEVER  NAUSEA AND VOMITING THAT IS NOT CONTROLLED WITH YOUR NAUSEA MEDICATION  *UNUSUAL SHORTNESS OF BREATH  *UNUSUAL BRUISING OR BLEEDING  TENDERNESS IN MOUTH AND THROAT WITH OR WITHOUT PRESENCE OF ULCERS  *URINARY PROBLEMS  *BOWEL PROBLEMS  UNUSUAL RASH Items with * indicate a potential emergency and should be followed up as soon as possible. If you have an emergency after office hours please contact your primary care physician or go to the nearest emergency department.  Please call the clinic during office hours if you have any questions or concerns.   You may also contact the Patient Navigator at [phone] should you have any questions or need assistance in obtaining follow up care.      Resources For Cancer Patients and their Caregivers ? American Cancer Society: Can assist with transportation, wigs, general needs, runs Look Good Feel Better.        873-008-7548 ? Cancer Care: Provides financial assistance, online support groups, medication/co-pay assistance.   1-800-813-HOPE 616 432 2930) ? [facility] Assists [place] cancer patients and their families through emotional , educational and financial support.  (604) 743-0864 ? [place] DSS Where to apply for food stamps, Medicaid and utility assistance. (331)458-2026 ? [facility]: Transportation to medical appointments. 905-632-4977 ? Social Security Administration: May apply for disability if have a Stage IV cancer. [phone] ? [employer], Disability and Transit Services: Assists with nutrition, care and transit needs. 3107893742

## 2019-06-14 NOTE — Patient Instructions (Signed)
[facility] at [facility] Discharge Instructions  Labs drawn peripherally today   Thank you for choosing [facility] at [facility] to provide your oncology and hematology care.  To afford each patient quality time with our provider, please arrive at least 15 minutes before your scheduled appointment time.   If you have a lab appointment with the Cancer Center please come in thru the Main Entrance and check in at the main information desk.  You need to re-schedule your appointment should you arrive 10 or more minutes late.  We strive to give you quality time with our providers, and arriving late affects you and other patients whose appointments are after yours.  Also, if you no show three or more times for appointments you may be dismissed from the clinic at the providers discretion.     Again, thank you for choosing [facility].  Our hope is that these requests will decrease the amount of time that you wait before being seen by our physicians.       _____________________________________________________________  Should you have questions after your visit to [facility], please contact our office at [phone] between the hours of 8:00 a.m. and 4:30 p.m.  Voicemails left after 4:00 p.m. will not be returned until the following business day.  For prescription refill requests, have your pharmacy contact our office and allow 72 hours.    Due to Covid, you will need to wear a mask upon entering the hospital. If you do not have a mask, a mask will be given to you at the Main Entrance upon arrival. For doctor visits, patients may have 1 support person with them. For treatment visits, patients can not have anyone with them due to social distancing guidelines and our immunocompromised population.     

## 2019-06-15 ENCOUNTER — Inpatient Hospital Stay (HOSPITAL_COMMUNITY): Payer: Medicare HMO

## 2019-06-15 ENCOUNTER — Other Ambulatory Visit: Payer: Self-pay

## 2019-06-15 DIAGNOSIS — Z5111 Encounter for antineoplastic chemotherapy: Secondary | ICD-10-CM | POA: Diagnosis not present

## 2019-06-15 MED ORDER — HEPARIN SOD (PORK) LOCK FLUSH 100 UNIT/ML IV SOLN: 500.0000 [IU] | Freq: Once | INTRAVENOUS | Status: DC

## 2019-06-15 MED ORDER — SODIUM CHLORIDE 0.9% FLUSH: 10.0000 mL | INTRAVENOUS | Status: DC | PRN

## 2019-06-15 MED ORDER — SODIUM CHLORIDE 0.9 % IV SOLN: INTRAVENOUS | Status: DC

## 2019-06-15 NOTE — Progress Notes (Signed)
Hydration fluids given per orders today. Patient tolerated it well without problems. Vitals stable and discharged home from clinic ambulatory. Follow up as scheduled.

## 2019-06-21 ENCOUNTER — Encounter: Payer: Self-pay | Admitting: Family Medicine

## 2019-06-21 ENCOUNTER — Other Ambulatory Visit: Payer: Self-pay

## 2019-06-21 ENCOUNTER — Ambulatory Visit (INDEPENDENT_AMBULATORY_CARE_PROVIDER_SITE_OTHER): Payer: Medicare HMO | Admitting: Family Medicine

## 2019-06-21 VITALS — Ht 63.0 in | Wt 114.0 lb

## 2019-06-21 DIAGNOSIS — C259 Malignant neoplasm of pancreas, unspecified: Secondary | ICD-10-CM | POA: Diagnosis not present

## 2019-06-21 DIAGNOSIS — R1030 Lower abdominal pain, unspecified: Secondary | ICD-10-CM | POA: Diagnosis not present

## 2019-06-21 DIAGNOSIS — C787 Secondary malignant neoplasm of liver and intrahepatic bile duct: Secondary | ICD-10-CM

## 2019-06-21 DIAGNOSIS — R109 Unspecified abdominal pain: Secondary | ICD-10-CM | POA: Diagnosis not present

## 2019-06-21 LAB — POCT URINALYSIS DIP (CLINITEK)
Bilirubin, UA: NEGATIVE
Blood, UA: NEGATIVE
Glucose, UA: NEGATIVE mg/dL
Ketones, POC UA: NEGATIVE mg/dL
Leukocytes, UA: NEGATIVE
Nitrite, UA: NEGATIVE
POC PROTEIN,UA: 100 — AB
Spec Grav, UA: 1.015 (ref 1.010–1.025)
Urobilinogen, UA: 0.2 U/dL
pH, UA: 6 (ref 5.0–8.0)

## 2019-06-21 NOTE — Assessment & Plan Note (Signed)
No cause for abdominal pain could be identified via the phone.  Dip in office was negative.  She reports that the abdominal pain has subsided.  I have advised her to reach out to her oncologist to make sure that they are aware that she did have some abdominal discomfort in case it is related to some form of treatment.  She is encouraged to make sure that she is drinking plenty of water and having adequate bowel movements and urine output.

## 2019-06-21 NOTE — Assessment & Plan Note (Signed)
Reports doing okay with this.  Is being followed by oncology.  Please see their notes in chart and AMP below.

## 2019-06-21 NOTE — Progress Notes (Signed)
Virtual Visit via Telephone Note   This visit type was conducted due to national recommendations for restrictions regarding the COVID-19 Pandemic (e.g. social distancing) in an effort to limit this patient's exposure and mitigate transmission in our community.  Due to her co-morbid illnesses, this patient is at least at moderate risk for complications without adequate follow up.  This format is felt to be most appropriate for this patient at this time.  The patient did not have access to video technology/had technical difficulties with video requiring transitioning to audio format only (telephone).  All issues noted in this document were discussed and addressed.  No physical exam could be performed with this format.    Evaluation Performed:  Follow-up visit  Date:  06/21/2019   ID:  [name], DOB 1928-07-05, MRN [id number]  Patient Location: Home Provider Location: Office  Location of Patient: Home Location of Provider: Telehealth Consent was obtain for visit to be over via telehealth. I verified that I am speaking with the correct person using two identifiers.  PCP:  Fayrene Helper, MD   Chief Complaint:  Abdominal pain   History of Present Illness:    [name] is a 86 y.o. female with history of anemia, depression, anxiety, current treatment with pancreatic cancer with mets to the liver, hypertension among others.  Presented with 2 days of abdominal pain that started on Sunday.  Left a urine sample on Monday in the office it was negative for any signs of urinary tract infection.  Reports that within the last 24 hours her symptoms of the abdominal pain have resolved.  She does not know what caused it.  She denies having any constipation.  She reports not having any issues today.  She denies having any blood in her urine or stool.  She denies having any other signs or symptoms of infection.  Reports that she did not have any burning sensation it was just abdominal  discomfort.  The patient does not have symptoms concerning for COVID-19 infection (fever, chills, cough, or new shortness of breath).   Past Medical, Surgical, Social History, Allergies, and Medications have been Reviewed.  Past Medical History:  Diagnosis Date  . Anemia due to antineoplastic chemotherapy 09/12/2015   Started Aranesp 500 mcg on 09/12/2015  . Anxiety   . Chronic diarrhea   . Depression   . DNR (do not resuscitate) 08/16/2015  . Erosive esophagitis   . Family history of breast cancer in female   . Family history of prostate cancer   . GERD (gastroesophageal reflux disease)   . Hx of adenomatous colonic polyps    tubular adenomas, last found in 2008  . Hyperplastic colon polyp 03/19/10   tcs by Dr. Gala Romney  . Hypertension 20 years   . Kidney stone    hx/ crushed   . Opioid contract exists 04/18/2015   With Dr. Moshe Cipro  . Pancreatic cancer ([facility]) 02/2014  . Pancreatic cancer metastasized to liver ([facility]) 03/10/2014  . Schatzki's ring 08/26/10   Last dilated on EGD by Dr. Trevor Iha [facility], linear gastric erosions, BI hemigastrectomy   Past Surgical History:  Procedure Laterality Date  . BREAST LUMPECTOMY Left   . bunion removal     from both feet   . CHOLECYSTECTOMY  1965   . COLONOSCOPY  03/19/2010   DR Gala Romney,, normal TI, pancolonic diverticula, random colon bx neg., hyperplastic polyps removed  . ESOPHAGOGASTRODUODENOSCOPY  11/22/2003   DR Gala Romney, erosive RE, Billroth I  .  ESOPHAGOGASTRODUODENOSCOPY  08/26/10   Dr. Gala Romney- moderate severe ERE, Scahtzki ring s/p dilation, Billroth I, linear gastric erosions, bx-gastric xanthelasma  . LEFT SHOULDER SURGERY  2009   DR HARRISON  . ORIF ANKLE FRACTURE Right 05/22/2013   Procedure: OPEN REDUCTION INTERNAL FIXATION (ORIF) RIGHT ANKLE FRACTURE;  Surgeon: Sanjuana Kava, MD;  Location: [facility];  Service: Orthopedics;  Laterality: Right;  . stomach ulcer  50 years ago    had some of her stomach removed      Current Meds  Medication  Sig  . acetaminophen (TYLENOL) 500 MG tablet Take 500 mg by mouth every 6 (six) hours as needed for mild pain or moderate pain.   Marland Kitchen amLODipine (NORVASC) 10 MG tablet TAKE 1 TABLET DAILY  . CISPLATIN IV Inject into the vein. Days 1, 8 every 21 days  . clobetasol (TEMOVATE) 0.05 % external solution APP AA ON SCALP BID PRN NOT TO FACE GROIN OR UNDERARMS  . clonazePAM (KLONOPIN) 0.5 MG tablet Take 1 tablet (0.5 mg total) by mouth at bedtime.  . cycloSPORINE (RESTASIS) 0.05 % ophthalmic emulsion Place 1 drop into both eyes 2 (two) times daily.  . ergocalciferol (VITAMIN D2) 1.25 MG (50000 UT) capsule Take 1 capsule (50,000 Units total) by mouth once a week.  . fluorometholone (FML) 0.1 % ophthalmic suspension INSTILL 1 DROP INTO EACH EYE THREE TIMES DAILY FOR 14 DAYS  . fluticasone (FLONASE) 50 MCG/ACT nasal spray Place 1 spray into both nostrils daily.  Marland Kitchen GEMCITABINE HCL IV Inject into the vein. Days 1, 8 every 21 days  . lidocaine (XYLOCAINE) 2 % solution RINSE WITH 5ML AS NEEDED TO RELIEVE MOUTH PAIN.  Marland Kitchen lidocaine-prilocaine (EMLA) cream Apply 1 application topically as needed. Apply to portacath site as needed  . loperamide (IMODIUM A-D) 2 MG tablet Take 2 mg by mouth 4 (four) times daily as needed for diarrhea or loose stools.  . metoprolol tartrate (LOPRESSOR) 25 MG tablet TAKE 1 TABLET DAILY FOR BLOOD PRESSURE (DOSE REDUCTION)  . mirtazapine (REMERON) 15 MG tablet Take 15 mg by mouth at bedtime.  Marland Kitchen MYRBETRIQ 50 MG TB24 tablet TAKE 1 TABLET DAILY  . pantoprazole (PROTONIX) 20 MG tablet TAKE 1 TABLET DAILY  . senna-docusate (SENOKOT-S) 8.6-50 MG tablet Take 1 tablet by mouth daily.     Allergies:   Iohexol, Aciphex [rabeprazole sodium], Amlodipine besylate-valsartan, Esomeprazole magnesium, Omeprazole, Ciprofloxacin, Penicillins, and Tessalon perles [benzonatate]   ROS:   Please see the history of present illness.    All other systems reviewed and are negative.   Labs/Other Tests and Data  Reviewed:    Recent Labs: 05/30/2019: TSH 1.447 06/14/2019: ALT 12; BUN 19; Creatinine, Ser 1.44; Hemoglobin 8.6; Magnesium 2.2; Platelets 163; Potassium 3.9; Sodium 140   Recent Lipid Panel Lab Results  Component Value Date/Time   CHOL 163 05/30/2019 08:05 AM   TRIG 63 05/30/2019 08:05 AM   HDL 67 05/30/2019 08:05 AM   CHOLHDL 2.4 05/30/2019 08:05 AM   LDLCALC 83 05/30/2019 08:05 AM    Wt Readings from Last 3 Encounters:  06/21/19 114 lb (51.7 kg)  06/14/19 114 lb 9.6 oz (52 kg)  06/13/19 113 lb 11.2 oz (51.6 kg)     Objective:    Vital Signs:  Ht 5\' 3"  (1.6 m)   Wt 114 lb (51.7 kg)   BMI 20.19 kg/m    GEN:  Alert and oriented RESPIRATORY:  No shortness of breath noted in conversation PSYCH:  Normal affect and mood  ASSESSMENT &  PLAN:    1. Abdominal pain, unspecified abdominal location   2. Pancreatic cancer metastasized to liver [facility])   Time:   Today, I have spent 10 minutes with the patient with telehealth technology discussing the above problems.     Medication Adjustments/Labs and Tests Ordered: Current medicines are reviewed at length with the patient today.  Concerns regarding medicines are outlined above.   Tests Ordered: No orders of the defined types were placed in this encounter.   Medication Changes: No orders of the defined types were placed in this encounter.   Disposition:  Follow up 11/22/2019  Signed, Perlie Mayo, NP  06/21/2019 8:50 AM     [place]

## 2019-06-21 NOTE — Addendum Note (Signed)
Addended by: Eual Fines on: 06/21/2019 09:14 AM   Modules accepted: Orders

## 2019-06-21 NOTE — Patient Instructions (Signed)
Happy New Year! May you have a year filled with hope, love, happiness and laughter.  I appreciate the opportunity to provide you with care for your health and wellness. Today we discussed: Abdominal pain  Follow up: As scheduled on July 13 of 2021  No labs or referrals today  Very glad that your abdominal pain has subsided.  Please make sure that she let your oncologist know that she did have this discomfort.  As it does not appear to have had any causes related to urinary tract infection.  Please make sure that you are staying hydrated and drinking plenty of water.  If you have any changes or you need [place] before your next appointment please will hesitate to reach out.  Please continue to practice social distancing to keep you, your family, and our community safe.  If you must go out, please wear a mask and practice good handwashing.  It was a pleasure to see you and I look forward to continuing to work together on your health and well-being. Please do not hesitate to call the office if you need care or have questions about your care.  Have a wonderful day and week. With Gratitude, Cherly Beach, DNP, AGNP-BC

## 2019-06-28 ENCOUNTER — Encounter (HOSPITAL_COMMUNITY): Payer: Self-pay | Admitting: Hematology

## 2019-06-28 ENCOUNTER — Inpatient Hospital Stay (HOSPITAL_BASED_OUTPATIENT_CLINIC_OR_DEPARTMENT_OTHER): Payer: Medicare HMO | Admitting: Hematology

## 2019-06-28 ENCOUNTER — Other Ambulatory Visit: Payer: Self-pay

## 2019-06-28 ENCOUNTER — Inpatient Hospital Stay (HOSPITAL_COMMUNITY): Payer: Medicare HMO

## 2019-06-28 ENCOUNTER — Encounter (HOSPITAL_COMMUNITY): Payer: Self-pay

## 2019-06-28 VITALS — BP 150/61 | HR 61 | Temp 96.9°F | Resp 18

## 2019-06-28 VITALS — BP 179/55 | HR 59 | Temp 97.1°F | Resp 18 | Wt 116.0 lb

## 2019-06-28 DIAGNOSIS — C259 Malignant neoplasm of pancreas, unspecified: Secondary | ICD-10-CM

## 2019-06-28 DIAGNOSIS — R3915 Urgency of urination: Secondary | ICD-10-CM | POA: Diagnosis not present

## 2019-06-28 DIAGNOSIS — C787 Secondary malignant neoplasm of liver and intrahepatic bile duct: Secondary | ICD-10-CM

## 2019-06-28 DIAGNOSIS — Z5111 Encounter for antineoplastic chemotherapy: Secondary | ICD-10-CM | POA: Diagnosis not present

## 2019-06-28 LAB — URINALYSIS, ROUTINE W REFLEX MICROSCOPIC
Bacteria, UA: NONE SEEN
Bilirubin Urine: NEGATIVE
Glucose, UA: NEGATIVE mg/dL
Hgb urine dipstick: NEGATIVE
Ketones, ur: NEGATIVE mg/dL
Leukocytes,Ua: NEGATIVE
Nitrite: NEGATIVE
Protein, ur: 100 mg/dL — AB
Specific Gravity, Urine: 1.01 (ref 1.005–1.030)
pH: 6 (ref 5.0–8.0)

## 2019-06-28 LAB — COMPREHENSIVE METABOLIC PANEL
ALT: 14 U/L (ref 0–44)
AST: 19 U/L (ref 15–41)
Albumin: 3.5 g/dL (ref 3.5–5.0)
Alkaline Phosphatase: 191 U/L — ABNORMAL HIGH (ref 38–126)
Anion gap: 9 (ref 5–15)
BUN: 23 mg/dL (ref 8–23)
CO2: 25 mmol/L (ref 22–32)
Calcium: 7.9 mg/dL — ABNORMAL LOW (ref 8.9–10.3)
Chloride: 106 mmol/L (ref 98–111)
Creatinine, Ser: 1.18 mg/dL — ABNORMAL HIGH (ref 0.44–1.00)
GFR calc Af Amer: 48 mL/min — ABNORMAL LOW (ref >60)
GFR calc non Af Amer: 41 mL/min — ABNORMAL LOW (ref >60)
Glucose, Bld: 92 mg/dL (ref 70–99)
Potassium: 3.8 mmol/L (ref 3.5–5.1)
Sodium: 140 mmol/L (ref 135–145)
Total Bilirubin: 0.7 mg/dL (ref 0.3–1.2)
Total Protein: 6.8 g/dL (ref 6.5–8.1)

## 2019-06-28 LAB — CBC WITH DIFFERENTIAL/PLATELET
Abs Immature Granulocytes: 0.02 10*3/uL (ref 0.00–0.07)
Basophils Absolute: 0 10*3/uL (ref 0.0–0.1)
Basophils Relative: 0 %
Eosinophils Absolute: 0.1 10*3/uL (ref 0.0–0.5)
Eosinophils Relative: 2 %
HCT: 25.7 % — ABNORMAL LOW (ref 36.0–46.0)
Hemoglobin: 8.4 g/dL — ABNORMAL LOW (ref 12.0–15.0)
Immature Granulocytes: 0 %
Lymphocytes Relative: 16 %
Lymphs Abs: 0.8 10*3/uL (ref 0.7–4.0)
MCH: 31.3 pg (ref 26.0–34.0)
MCHC: 32.7 g/dL (ref 30.0–36.0)
MCV: 95.9 fL (ref 80.0–100.0)
Monocytes Absolute: 0.5 10*3/uL (ref 0.1–1.0)
Monocytes Relative: 11 %
Neutro Abs: 3.7 10*3/uL (ref 1.7–7.7)
Neutrophils Relative %: 71 %
Platelets: 138 10*3/uL — ABNORMAL LOW (ref 150–400)
RBC: 2.68 MIL/uL — ABNORMAL LOW (ref 3.87–5.11)
RDW: 16.8 % — ABNORMAL HIGH (ref 11.5–15.5)
WBC: 5.2 10*3/uL (ref 4.0–10.5)
nRBC: 0 % (ref 0.0–0.2)

## 2019-06-28 LAB — MAGNESIUM: Magnesium: 1.9 mg/dL (ref 1.7–2.4)

## 2019-06-28 LAB — LACTATE DEHYDROGENASE: LDH: 139 U/L (ref 98–192)

## 2019-06-28 MED ORDER — HEPARIN SOD (PORK) LOCK FLUSH 100 UNIT/ML IV SOLN
500.0000 [IU] | Freq: Once | INTRAVENOUS | Status: AC | PRN
  Administered 2019-06-28: 14:00:00 500 [IU]

## 2019-06-28 MED ORDER — SODIUM CHLORIDE 0.9 % IV SOLN
20.0000 mg/m2 | Freq: Once | INTRAVENOUS | Status: AC
  Administered 2019-06-28: 14:00:00 31 mg via INTRAVENOUS
  Filled 2019-06-28: qty 31

## 2019-06-28 MED ORDER — PALONOSETRON HCL INJECTION 0.25 MG/5ML
0.2500 mg | Freq: Once | INTRAVENOUS | Status: AC
  Administered 2019-06-28: 11:00:00 0.25 mg via INTRAVENOUS
  Filled 2019-06-28: qty 5

## 2019-06-28 MED ORDER — SODIUM CHLORIDE 0.9 % IV SOLN: Freq: Once | INTRAVENOUS | Status: AC

## 2019-06-28 MED ORDER — SODIUM CHLORIDE 0.9 % IV SOLN
750.0000 mg/m2 | Freq: Once | INTRAVENOUS | Status: AC
  Administered 2019-06-28: 1140 mg via INTRAVENOUS
  Filled 2019-06-28: qty 26.3

## 2019-06-28 MED ORDER — SODIUM CHLORIDE 0.9% FLUSH
10.0000 mL | INTRAVENOUS | Status: DC | PRN
  Administered 2019-06-28: 14:00:00 10 mL

## 2019-06-28 MED ORDER — POTASSIUM CHLORIDE 2 MEQ/ML IV SOLN
Freq: Once | INTRAVENOUS | Status: AC
  Filled 2019-06-28: qty 10

## 2019-06-28 MED ORDER — SODIUM CHLORIDE 0.9 % IV SOLN
Freq: Once | INTRAVENOUS | Status: AC
  Filled 2019-06-28: qty 5

## 2019-06-28 NOTE — Progress Notes (Signed)
Patient has been assessed, vital signs and labs have been reviewed by Dr. Katragadda. ANC, Creatinine, LFTs, and Platelets are within treatment parameters per Dr. Katragadda. The patient is good to proceed with treatment at this time.  

## 2019-06-28 NOTE — Progress Notes (Signed)
Patient presents today for follow up visit with Dr. Delton Coombes and treatment. BP elevated on arrival. Patient has no complaints of any pain today. Patient has complaints of heart palpitations when she gets up and moves around. Patient instructed to inform Dr. Delton Coombes about her concerns. Patient states she did take her BP medication this morning. Labs pending.   BP repeat 146/53. Urine collected per [facility] LPN/ Dr. Delton Coombes request. UA , C&S sent to the lab.  Patient voided 150 mls in nuns cap.    Labs within parameters for treatment.   Message received from [facility] LPN/ Dr. Roanna Banning. Patient is good for treatment.   Voided 150 mls.   Verbal orders received from Dr. Delton Coombes, "Patient to return tomorrow for Udenyca and hydration fluids on supportive therapy." Scheduling notified and aware. Appointments made.   Treatment given today per MD orders. Tolerated infusion without adverse affects. Vital signs stable. No complaints at this time. Discharged from clinic ambulatory. F/U with [facility] as scheduled.

## 2019-06-28 NOTE — Assessment & Plan Note (Signed)
1.  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 - Gemcitabine and Abraxane from normal 2015 through August 2018 with progression. -Gemcitabine and Tarceva from 01/28/2017 through 10/07/2017 with progression. -21 cycles of infusional 5-FU and Onivyde from 10/21/2017-09/08/2018. -7 cycles of FOLFOX from 09/22/2018-12/29/2018. -CT CAP on 01/10/2019 showed increase in size of dominant hepatic lesion near the dome measuring 4.7 cm, previously 2.6 cm.  Other lesions in the liver are stable.  No new lesions seen.  No lesions in the lungs. 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  MSI high was not detected. -4 cycles of gemcitabine (750 mg/m2) and cisplatin (20 mg/m2) day 1 and day 15 from 02/10/2019 through 05/12/2019. -CT CAP on 06/09/2019 reviewed by me shows right hepatic lesion measuring 4 x 3.2 cm, previously 3.9 x 3.6 cm.  Segment 2 lesion measures 2.4 x 1.9 cm, left and right omental nodules increased in size by 5 mm.  However study is limited because of noncontrast. -Cycle 5-day 1 of treatment on 06/14/2019.  She has tolerated last dose reasonably well. -No back pain reported.  We reviewed her labs.  CBC shows normal white count.  LFTs are normal.  Creatinine is improved. -She has received first dose of COVID-19 vaccine.  She will proceed with her treatment today. -She will come back in 2 weeks for follow-up and next cycle. -She reported some urgency of urination.  Denies any dysuria.  We have checked a UA which was normal.  Urine culture is pending.  2.  Weight loss: -She has gained 3 pounds since last visit.  3.  Genetic testing: -Germline mutation testing was negative on 12/24/2018.  4.  Renal insufficiency: -Her creatinine is better today at 1.18.  She will receive post chemo hydration tomorrow.  5.  Vitamin D deficiency: -She will continue vitamin D 50,000 units weekly.  We will check levels in the  future.  6.  Normocytic anemia: -This is from combination of CKD and chemotherapy.  Hemoglobin is 8.4 today.  We will transfuse as needed.

## 2019-06-28 NOTE — Patient Instructions (Addendum)
[facility] at [facility] Discharge Instructions  You were seen today by Dr. Katragadda. He went over your recent lab results. He will see you back in 2 weeks for labs and follow up.   Thank you for choosing [facility] at [facility] to provide your oncology and hematology care.  To afford each patient quality time with our provider, please arrive at least 15 minutes before your scheduled appointment time.   If you have a lab appointment with the Cancer Center please come in thru the  Main Entrance and check in at the main information desk  You need to re-schedule your appointment should you arrive 10 or more minutes late.  We strive to give you quality time with our providers, and arriving late affects you and other patients whose appointments are after yours.  Also, if you no show three or more times for appointments you may be dismissed from the clinic at the providers discretion.     Again, thank you for choosing [facility].  Our hope is that these requests will decrease the amount of time that you wait before being seen by our physicians.       _____________________________________________________________  Should you have questions after your visit to [facility], please contact our office at [phone] between the hours of 8:00 a.m. and 4:30 p.m.  Voicemails left after 4:00 p.m. will not be returned until the following business day.  For prescription refill requests, have your pharmacy contact our office and allow 72 hours.    Cancer Center Support Programs:   > Cancer Support Group  2nd Tuesday of the month 1pm-2pm, Journey Room    

## 2019-06-28 NOTE — Patient Instructions (Signed)

## 2019-06-28 NOTE — Progress Notes (Signed)
[place], [place] [address]   CLINIC:  Medical Oncology/Hematology  PCP:  Fayrene Helper, MD [address], [place] [address] [phone]   REASON FOR VISIT:  Follow-up for metastatic pancreatic cancer to the liver   BRIEF ONCOLOGIC HISTORY:  Oncology History  Pancreatic cancer metastasized to liver ([place])  03/10/2014 Initial Diagnosis   Pancreatic cancer metastasized to liver   03/22/2014 - 03/05/2016 Chemotherapy   Abraxane/Gemzar days 1, 8, every 28 days.  Day 15 was cancelled due to leukopenia and thrombocytopenia on day 15 cycle 1.   05/24/2014 Treatment Plan Change   Day 8 of cycle 3 is held with ANC of 1.1   06/05/2014 Imaging   CT C/A/P . Interval decrease in size of the pancreatic tail mass.Improved hepatic metastatic disease. No new lesions. No CT findings for metastatic disease involving the chest.   08/09/2014 Tumor Marker   CA 19-9= 33 (WNL)   10/26/2014 Imaging   MRI- Continued interval decrease in size of the hepatic metastatic lesions and no new lesions are identified. Continued decrease in size of the pancreatic tail lesion.   01/03/2015 Tumor Marker   Results for MIN, TUNNELL (MRN [id number]) as of 01/18/2015 12:52  01/03/2015 10:00 CA 19-9: 14    01/17/2015 Imaging   MRI- Response to therapy of hepatic metastasis.  Similar size of a pancreatic tail lesion.   03/20/2015 Imaging   MRI-L spine- Severe disc space narrowing at L2-L3, with endplate reactive changes. Large disc extrusion into the ventral epidural space,central to the RIGHT with a cephalad migrated free fragment. Additional Large disc extrusion into the retroperitone...   03/22/2015 Imaging   CT pelvis- No evidence for metastatic disease within the pelvis.   07/24/2015 Imaging   MRI abd- Continued response to therapy, with no residual detectable liver metastases. No new sites of metastatic disease in the abdomen.    08/15/2015 Code Status      She confirms desire for DNR status.   12/21/2015 Imaging   MRI liver- Severe image degradation due to motion artifact reducing diagnostic sensitivity and specificity. Reduce conspicuity of the pancreatic tail lesion suggesting further improvement. The original liver lesions have resolved.   03/05/2016 Treatment Plan Change   Chemotherapy holiday/Break after 2 years worth of treatment   04/07/2016 Imaging   CT chest- When compared to recent chest CT, new minimally displaced anterior left sixth rib fracture. Slight increase in subcarinal adenopathy   04/28/2016 Imaging   Bone density- BMD as determined from Femur Neck Right is 0.705 g/cm2 with a T-Score of -2.4. This patient is considered osteopenic according to [facility]) criteria. Compared with the prior study on 02/23/2013, the BMD of the lumbar spine/rt. femoral neck show a statistically significant decrease.    05/23/2016 Imaging   MRI abd- 1. Exam is significantly degraded by patient respiratory motion. Consider follow-up exams with CT abdomen with without contrast per pancreatic protocol. 2. Fullness in tail of pancreas is more prominent and potentially increased in size. Recommend close attention on follow-up. (Consider CT as above) 3. No explanation for back pain.   09/11/2016 Imaging   MRI pancreas- Interval progression of pancreatic tail lesion. New differential perfusion right liver with areas of heterogeneity. Underlying metastatic disease in this region not excluded.   09/11/2016 Progression   MRi in conjunction with rising CA 19-9 are indicative of relapse of disease.   01/09/2017 Progression   CT C/A/P: 2.1 x  2.9 cm lesion in the pancreatic tail and abutting the splenic hilum, corresponding to known primary pancreatic neoplasm, mildly increased.  Scattered small hypoenhancing lesions in the liver measuring up to 10 mm, approximately 8-10 in number, suspicious for hepatic metastases.  No  findings specific for metastatic disease in the chest.  Additional ancillary findings as above.    01/27/2017 - 10/20/2017 Chemotherapy   The patient had gemcitabine (GEMZAR) 1,292 mg in sodium chloride 0.9 % 250 mL chemo infusion, 800 mg/m2 = 1,292 mg, Intravenous,  Once, 9 of 12 cycles Administration: 1,292 mg (01/28/2017), 1,292 mg (02/11/2017), 1,292 mg (02/25/2017), 1,292 mg (03/11/2017), 1,292 mg (03/25/2017), 1,292 mg (04/08/2017), 1,292 mg (04/22/2017), 1,292 mg (04/29/2017), 1,292 mg (05/20/2017), 1,292 mg (06/03/2017), 1,292 mg (06/24/2017), 1,292 mg (07/08/2017), 1,292 mg (07/29/2017), 1,292 mg (08/12/2017), 1,292 mg (08/26/2017), 1,292 mg (09/09/2017), 1,292 mg (09/23/2017), 1,292 mg (10/07/2017)  for chemotherapy treatment.    10/21/2017 - 09/22/2018 Chemotherapy   The patient had leucovorin 700 mg in dextrose 5 % 250 mL infusion, 644 mg, Intravenous,  Once, 22 of 22 cycles Administration: 700 mg (10/21/2017), 700 mg (11/04/2017), 700 mg (11/18/2017), 700 mg (12/02/2017), 700 mg (12/16/2017), 700 mg (12/30/2017), 700 mg (01/13/2018), 700 mg (01/27/2018), 700 mg (02/10/2018), 700 mg (02/24/2018), 700 mg (03/10/2018), 700 mg (03/24/2018), 700 mg (04/28/2018), 700 mg (05/17/2018), 700 mg (05/31/2018), 700 mg (06/16/2018), 700 mg (06/30/2018), 700 mg (07/21/2018), 700 mg (08/04/2018), 700 mg (08/25/2018), 700 mg (09/08/2018) ondansetron (ZOFRAN) 8 mg in sodium chloride 0.9 % 50 mL IVPB, , Intravenous,  Once, 21 of 21 cycles Administration:  (11/04/2017),  (01/27/2018),  (02/10/2018),  (02/24/2018),  (03/10/2018),  (03/24/2018),  (04/28/2018),  (05/17/2018),  (05/31/2018),  (06/16/2018),  (06/30/2018),  (07/21/2018),  (08/04/2018),  (08/25/2018),  (09/08/2018) fluorouracil (ADRUCIL) 3,850 mg in sodium chloride 0.9 % 73 mL chemo infusion, 2,400 mg/m2 = 3,850 mg, Intravenous, 1 Day/Dose, 22 of 22 cycles Dose modification: 1,920 mg/m2 (original dose 2,400 mg/m2, Cycle 2, Reason: Provider Judgment, Comment: mucisitis) Administration: 3,850 mg  (10/21/2017), 3,100 mg (11/04/2017), 3,100 mg (11/18/2017), 3,100 mg (12/02/2017), 3,100 mg (12/16/2017), 3,100 mg (12/30/2017), 3,100 mg (01/13/2018), 3,100 mg (01/27/2018), 3,100 mg (02/10/2018), 3,100 mg (02/24/2018), 3,100 mg (03/10/2018), 3,100 mg (03/24/2018), 3,100 mg (04/28/2018), 3,100 mg (05/17/2018), 3,100 mg (05/31/2018), 3,100 mg (06/16/2018), 3,100 mg (06/30/2018), 3,100 mg (07/21/2018), 3,100 mg (08/04/2018), 3,100 mg (08/25/2018), 3,100 mg (09/08/2018) irinotecan LIPOSOME (ONIVYDE) 81.7 mg in sodium chloride 0.9 % 500 mL chemo infusion, 50 mg/m2 = 81.7 mg (100 % of original dose 50 mg/m2), Intravenous, Once, 22 of 22 cycles Dose modification: 50 mg/m2 (original dose 50 mg/m2, Cycle 1, Reason: Provider Judgment), 50 mg/m2 (original dose 50 mg/m2, Cycle 2, Reason: Provider Judgment) Administration: 81.7 mg (10/21/2017), 81.7 mg (11/04/2017), 81.7 mg (11/18/2017), 81.7 mg (12/02/2017), 81.7 mg (12/16/2017), 81.7 mg (12/30/2017), 81.7 mg (01/13/2018), 81.7 mg (01/27/2018), 81.7 mg (02/10/2018), 81.7 mg (02/24/2018), 81.7 mg (03/10/2018), 81.7 mg (03/24/2018), 81.7 mg (04/28/2018), 81.7 mg (05/17/2018), 81.7 mg (05/31/2018), 81.7 mg (06/16/2018), 81.7 mg (06/30/2018), 81.7 mg (07/21/2018), 81.7 mg (08/04/2018), 81.7 mg (08/25/2018), 81.7 mg (09/08/2018)  for chemotherapy treatment.    09/22/2018 - 01/11/2019 Chemotherapy   The patient had palonosetron (ALOXI) injection 0.25 mg, 0.25 mg, Intravenous,  Once, 7 of 8 cycles Administration: 0.25 mg (09/22/2018), 0.25 mg (10/11/2018), 0.25 mg (10/25/2018), 0.25 mg (12/01/2018), 0.25 mg (12/15/2018), 0.25 mg (11/17/2018), 0.25 mg (12/29/2018) pegfilgrastim (NEULASTA) injection 6 mg, 6 mg, Subcutaneous, Once, 5 of 5 cycles Administration: 6 mg (10/13/2018), 6 mg (10/27/2018), 6 mg (12/03/2018),  6 mg (12/17/2018), 6 mg (11/19/2018) pegfilgrastim-cbqv (UDENYCA) injection 6 mg, 6 mg, Subcutaneous, Once, 1 of 2 cycles Administration: 6 mg (12/31/2018) leucovorin 628 mg in dextrose 5 % 250 mL infusion, 400 mg/m2 =  628 mg, Intravenous,  Once, 7 of 8 cycles Administration: 628 mg (09/22/2018), 628 mg (10/11/2018), 628 mg (10/25/2018), 628 mg (12/01/2018), 628 mg (12/15/2018), 628 mg (11/17/2018), 628 mg (12/29/2018) oxaliplatin (ELOXATIN) 100 mg in dextrose 5 % 500 mL chemo infusion, 65 mg/m2 = 100 mg (100 % of original dose 65 mg/m2), Intravenous,  Once, 7 of 8 cycles Dose modification: 65 mg/m2 (original dose 65 mg/m2, Cycle 1, Reason: Provider Judgment) Administration: 100 mg (09/22/2018), 100 mg (10/11/2018), 100 mg (10/25/2018), 100 mg (12/01/2018), 100 mg (12/15/2018), 100 mg (11/17/2018), 100 mg (12/29/2018) fluorouracil (ADRUCIL) chemo injection 500 mg, 320 mg/m2 = 500 mg (100 % of original dose 320 mg/m2), Intravenous,  Once, 7 of 8 cycles Dose modification: 320 mg/m2 (original dose 320 mg/m2, Cycle 1, Reason: Provider Judgment) Administration: 500 mg (09/22/2018), 500 mg (10/11/2018), 500 mg (10/25/2018), 500 mg (12/01/2018), 500 mg (12/15/2018), 500 mg (11/17/2018), 500 mg (12/29/2018) fluorouracil (ADRUCIL) 3,000 mg in sodium chloride 0.9 % 90 mL chemo infusion, 1,920 mg/m2 = 3,000 mg (100 % of original dose 1,920 mg/m2), Intravenous, 1 Day/Dose, 7 of 8 cycles Dose modification: 1,920 mg/m2 (original dose 1,920 mg/m2, Cycle 1, Reason: Provider Judgment) Administration: 3,000 mg (09/22/2018), 3,000 mg (10/11/2018), 3,000 mg (10/25/2018), 3,000 mg (12/01/2018), 3,000 mg (12/15/2018), 3,000 mg (11/17/2018), 3,000 mg (12/29/2018)  for chemotherapy treatment.    12/24/2018 Genetic Testing   Negative genetic testing on the common hereditary cancer panel.  The Common Hereditary Gene Panel offered by [facility] includes sequencing and/or deletion duplication testing of the following 48 genes: APC, ATM, AXIN2, BARD1, BMPR1A, BRCA1, BRCA2, BRIP1, CDH1, CDK4, CDKN2A (p14ARF), CDKN2A (p16INK4a), CHEK2, CTNNA1, DICER1, EPCAM (Deletion/duplication testing only), GREM1 (promoter region deletion/duplication testing only), KIT, MEN1, MLH1, MSH2, MSH3, MSH6,  MUTYH, NBN, NF1, NHTL1, PALB2, PDGFRA, PMS2, POLD1, POLE, PTEN, RAD50, RAD51C, RAD51D, RNF43, SDHB, SDHC, SDHD, SMAD4, SMARCA4. STK11, TP53, TSC1, TSC2, and VHL.  The following genes were evaluated for sequence changes only: SDHA and HOXB13 c.251G>A variant only. The report date is December 24, 2018.  DICER1 VUS identified.  This will not change medical decision making.   02/10/2019 -  Chemotherapy   The patient had palonosetron (ALOXI) injection 0.25 mg, 0.25 mg, Intravenous,  Once, 5 of 6 cycles Administration: 0.25 mg (02/10/2019), 0.25 mg (02/24/2019), 0.25 mg (03/10/2019), 0.25 mg (03/17/2019), 0.25 mg (03/31/2019), 0.25 mg (04/14/2019), 0.25 mg (05/12/2019), 0.25 mg (05/30/2019), 0.25 mg (06/14/2019), 0.25 mg (06/28/2019) pegfilgrastim-jmdb (FULPHILA) injection 6 mg, 6 mg, Subcutaneous,  Once, 4 of 4 cycles Administration: 6 mg (02/25/2019), 6 mg (03/18/2019), 6 mg (05/31/2019) pegfilgrastim-cbqv (UDENYCA) injection 6 mg, 6 mg, Subcutaneous, Once, 1 of 2 cycles CISplatin (PLATINOL) 31 mg in sodium chloride 0.9 % 250 mL chemo infusion, 20 mg/m2 = 31 mg (80 % of original dose 25 mg/m2), Intravenous,  Once, 5 of 6 cycles Dose modification: 20 mg/m2 (80 % of original dose 25 mg/m2, Cycle 1, Reason: Other (see comments), Comment: renal dysfunction) Administration: 31 mg (02/10/2019), 31 mg (02/24/2019), 31 mg (03/10/2019), 31 mg (03/17/2019), 31 mg (03/31/2019), 31 mg (04/14/2019), 31 mg (05/30/2019), 31 mg (06/14/2019) gemcitabine (GEMZAR) 1,140 mg in sodium chloride 0.9 % 250 mL chemo infusion, 750 mg/m2 = 1,140 mg (75 % of original dose 1,000 mg/m2), Intravenous,  Once, 5 of 6 cycles Dose modification:  750 mg/m2 (75 % of original dose 1,000 mg/m2, Cycle 1, Reason: Provider Judgment) Administration: 1,140 mg (02/10/2019), 1,140 mg (02/24/2019), 1,140 mg (03/10/2019), 1,140 mg (03/17/2019), 1,140 mg (03/31/2019), 1,140 mg (04/14/2019), 1,140 mg (05/12/2019), 1,140 mg (05/30/2019), 1,140 mg (06/14/2019), 1,140 mg  (06/28/2019) fosaprepitant (EMEND) 150 mg, dexamethasone (DECADRON) 12 mg in sodium chloride 0.9 % 145 mL IVPB, , Intravenous,  Once, 5 of 6 cycles Administration:  (02/10/2019),  (02/24/2019),  (03/10/2019),  (03/17/2019),  (03/31/2019),  (04/14/2019),  (05/12/2019),  (05/30/2019),  (06/14/2019),  (06/28/2019)  for chemotherapy treatment.       CANCER STAGING: Cancer Staging Pancreatic cancer metastasized to liver [facility]) Staging form: Pancreas, AJCC 7th Edition - Clinical: Stage IV (T3, N1, M1) - Signed by Baird Cancer, PA-C on 03/16/2014 - Pathologic: No stage assigned - Unsigned    INTERVAL HISTORY:  [name] 86 y.o. female seen for follow-up of metastatic pancreatic cancer and toxicity assessment prior to cycle 5-day 15.  After last treatment, she denied any nausea vomiting or diarrhea.  No tingling or numbness in extremities.  Upper back pain has improved.  Appetite and energy levels are 75%.  Weight also improved by 3 pounds.  She reported some palpitations when she is walking.  Also reported some urgency of urination but without dysuria.  REVIEW OF SYSTEMS:  Review of Systems  Cardiovascular: Positive for palpitations.  Genitourinary: Positive for difficulty urinating.   All other systems reviewed and are negative.    PAST MEDICAL/SURGICAL HISTORY:  Past Medical History:  Diagnosis Date  . Anemia due to antineoplastic chemotherapy 09/12/2015   Started Aranesp 500 mcg on 09/12/2015  . Anxiety   . Chronic diarrhea   . Depression   . DNR (do not resuscitate) 08/16/2015  . Erosive esophagitis   . Family history of breast cancer in female   . Family history of prostate cancer   . GERD (gastroesophageal reflux disease)   . Hx of adenomatous colonic polyps    tubular adenomas, last found in 2008  . Hyperplastic colon polyp 03/19/10   tcs by Dr. Gala Romney  . Hypertension 20 years   . Kidney stone    hx/ crushed   . Opioid contract exists 04/18/2015   With Dr. Moshe Cipro  . Pancreatic  cancer ([place]) 02/2014  . Pancreatic cancer metastasized to liver ([place]) 03/10/2014  . Schatzki's ring 08/26/10   Last dilated on EGD by Dr. Trevor Iha [facility], linear gastric erosions, BI hemigastrectomy   Past Surgical History:  Procedure Laterality Date  . BREAST LUMPECTOMY Left   . bunion removal     from both feet   . CHOLECYSTECTOMY  1965   . COLONOSCOPY  03/19/2010   DR Gala Romney,, normal TI, pancolonic diverticula, random colon bx neg., hyperplastic polyps removed  . ESOPHAGOGASTRODUODENOSCOPY  11/22/2003   DR Gala Romney, erosive RE, Billroth I  . ESOPHAGOGASTRODUODENOSCOPY  08/26/10   Dr. Gala Romney- moderate severe ERE, Scahtzki ring s/p dilation, Billroth I, linear gastric erosions, bx-gastric xanthelasma  . LEFT SHOULDER SURGERY  2009   DR HARRISON  . ORIF ANKLE FRACTURE Right 05/22/2013   Procedure: OPEN REDUCTION INTERNAL FIXATION (ORIF) RIGHT ANKLE FRACTURE;  Surgeon: Sanjuana Kava, MD;  Location: [facility];  Service: Orthopedics;  Laterality: Right;  . stomach ulcer  50 years ago    had some of her stomach removed      SOCIAL HISTORY:  Social History   Socioeconomic History  . Marital status: Widowed    Spouse name: Not on file  .  Number of children: 2  . Years of education: 64  . Highest education level: 12th grade  Occupational History  . Occupation: retired from Estate manager/land agent: RETIRED  Tobacco Use  . Smoking status: Current Some Day Smoker    Packs/day: 0.50    Years: 60.00    Pack years: 30.00    Types: Cigarettes    Last attempt to quit: 09/09/2016    Years since quitting: 2.8  . Smokeless tobacco: Never Used  Substance and Sexual Activity  . Alcohol use: No  . Drug use: No  . Sexual activity: Not Currently  Other Topics Concern  . Not on file  Social History Narrative   Lives with son alone    Social Determinants of Health   Financial Resource Strain:   . Difficulty of Paying Living Expenses: Not on file  Food Insecurity:   . Worried About Charity fundraiser  in the Last Year: Not on file  . Ran Out of Food in the Last Year: Not on file  Transportation Needs:   . Lack of Transportation (Medical): Not on file  . Lack of Transportation (Non-Medical): Not on file  Physical Activity:   . Days of Exercise per Week: Not on file  . Minutes of Exercise per Session: Not on file  Stress:   . Feeling of Stress : Not on file  Social Connections:   . Frequency of Communication with Friends and Family: Not on file  . Frequency of Social Gatherings with Friends and Family: Not on file  . Attends Religious Services: Not on file  . Active Member of Clubs or Organizations: Not on file  . Attends Archivist Meetings: Not on file  . Marital Status: Not on file  Intimate Partner Violence:   . Fear of Current or Ex-Partner: Not on file  . Emotionally Abused: Not on file  . Physically Abused: Not on file  . Sexually Abused: Not on file    FAMILY HISTORY:  Family History  Problem Relation Age of Onset  . Hypertension Mother   . Kidney failure Brother        on dialysis  . Diabetes Sister   . Diabetes Sister   . Hypertension Father   . Kidney failure Sister   . Kidney Stones Brother   . Breast cancer Son 42  . Gout Son 19  . Gout Nephew 57  . Liver disease Neg Hx   . Colon cancer Neg Hx     CURRENT MEDICATIONS:  Outpatient Encounter Medications as of 06/28/2019  Medication Sig  . acetaminophen (TYLENOL) 500 MG tablet Take 500 mg by mouth every 6 (six) hours as needed for mild pain or moderate pain.   Marland Kitchen amLODipine (NORVASC) 10 MG tablet TAKE 1 TABLET DAILY  . CISPLATIN IV Inject into the vein. Days 1, 8 every 21 days  . clobetasol (TEMOVATE) 0.05 % external solution APP AA ON SCALP BID PRN NOT TO FACE GROIN OR UNDERARMS  . clonazePAM (KLONOPIN) 0.5 MG tablet Take 1 tablet (0.5 mg total) by mouth at bedtime.  . cycloSPORINE (RESTASIS) 0.05 % ophthalmic emulsion Place 1 drop into both eyes 2 (two) times daily.  . ergocalciferol (VITAMIN  D2) 1.25 MG (50000 UT) capsule Take 1 capsule (50,000 Units total) by mouth once a week.  . fluorometholone (FML) 0.1 % ophthalmic suspension INSTILL 1 DROP INTO EACH EYE THREE TIMES DAILY FOR 14 DAYS  . fluticasone (FLONASE) 50 MCG/ACT nasal spray Place  1 spray into both nostrils daily.  Marland Kitchen GEMCITABINE HCL IV Inject into the vein. Days 1, 8 every 21 days  . lidocaine (XYLOCAINE) 2 % solution RINSE WITH 5ML AS NEEDED TO RELIEVE MOUTH PAIN.  Marland Kitchen lidocaine-prilocaine (EMLA) cream Apply 1 application topically as needed. Apply to portacath site as needed  . loperamide (IMODIUM A-D) 2 MG tablet Take 2 mg by mouth 4 (four) times daily as needed for diarrhea or loose stools.  . metoprolol tartrate (LOPRESSOR) 25 MG tablet TAKE 1 TABLET DAILY FOR BLOOD PRESSURE (DOSE REDUCTION)  . mirtazapine (REMERON) 15 MG tablet Take 15 mg by mouth at bedtime.  Marland Kitchen MYRBETRIQ 50 MG TB24 tablet TAKE 1 TABLET DAILY  . pantoprazole (PROTONIX) 20 MG tablet TAKE 1 TABLET DAILY  . senna-docusate (SENOKOT-S) 8.6-50 MG tablet Take 1 tablet by mouth daily.  . [DISCONTINUED] prochlorperazine (COMPAZINE) 10 MG tablet Take 1 tablet (10 mg total) by mouth every 6 (six) hours as needed (Nausea or vomiting).   No facility-administered encounter medications on file as of 06/28/2019.    ALLERGIES:  Allergies  Allergen Reactions  . Iohexol Hives and Shortness Of Breath    **Patient can not have IV contrast, pt has breakthrough reaction even after 13 hour premeds** Do not give IV contrast PATIENT HAD TO BE TAKEN TO ED, C/O WHELPS, HIVES, DIFFICULTY BREATHING. PATIENT GIVEN IV CONTRAST AFTER 13 HOUR PRE MEDS ON 01/09/2017 WITH NO REACTION NOTED. On 10/19/17 patient had reaction despite premedication.    Marland Kitchen Aciphex [Rabeprazole Sodium] Other (See Comments)    unknown  . Amlodipine Besylate-Valsartan     Rash to high-dose (5/320)  . Esomeprazole Magnesium   . Omeprazole Other (See Comments)    Patient states "medication didn't work"  .  Ciprofloxacin Rash  . Penicillins Swelling and Rash    Has patient had a PCN reaction causing immediate rash, facial/tongue/throat swelling, SOB or lightheadedness with hypotension: Yes Has patient had a PCN reaction causing severe rash involving mucus membranes or skin necrosis: Yes Has patient had a PCN reaction that required hospitalization: No Has patient had a PCN reaction occurring within the last 10 years: yes If all of the above answers are "NO", then may proceed with Cephalosporin use.   [name] [Benzonatate] Itching    Full head, face, neck, and chest itching.      PHYSICAL EXAM:  ECOG Performance status: 1  Vitals:   06/28/19 0745  BP: (!) 179/55  Pulse: (!) 59  Resp: 18  Temp: (!) 97.1 F (36.2 C)  SpO2: 100%   Filed Weights   06/28/19 0745  Weight: 116 lb (52.6 kg)    Physical Exam Vitals reviewed.  Constitutional:      Appearance: Normal appearance.  Cardiovascular:     Rate and Rhythm: Normal rate and regular rhythm.     Heart sounds: Normal heart sounds.  Pulmonary:     Effort: Pulmonary effort is normal.     Breath sounds: Normal breath sounds.  Abdominal:     General: There is no distension.     Palpations: Abdomen is soft. There is no mass.  Musculoskeletal:        General: No swelling.  Skin:    General: Skin is warm.  Neurological:     General: No focal deficit present.     Mental Status: She is alert and oriented to person, place, and time.  Psychiatric:        Mood and Affect: Mood normal.  Behavior: Behavior normal.      LABORATORY DATA:  I have reviewed the labs as listed.  CBC    Component Value Date/Time   WBC 5.2 06/28/2019 0807   RBC 2.68 (L) 06/28/2019 0807   HGB 8.4 (L) 06/28/2019 0807   HCT 25.7 (L) 06/28/2019 0807   PLT 138 (L) 06/28/2019 0807   MCV 95.9 06/28/2019 0807   MCH 31.3 06/28/2019 0807   MCHC 32.7 06/28/2019 0807   RDW 16.8 (H) 06/28/2019 0807   LYMPHSABS 0.8 06/28/2019 0807   MONOABS  0.5 06/28/2019 0807   EOSABS 0.1 06/28/2019 0807   BASOSABS 0.0 06/28/2019 0807   CMP Latest Ref Rng & Units 06/28/2019 06/14/2019 06/13/2019  Glucose 70 - 99 mg/dL 92 126(H) 114(H)  BUN 8 - 23 mg/dL _0 Creatinine 0.44 - 1.00 mg/dL 1.18(H) 1.44(H) 1.58(H)  Sodium 135 - 145 mmol/L 140 140 140  Potassium 3.5 - 5.1 mmol/L 3.8 3.9 3.9  Chloride 98 - 111 mmol/L 106 108 107  CO2 22 - 32 mmol/L _1 Calcium 8.9 - 10.3 mg/dL 7.9(L) 8.2(L) 8.3(L)  Total Protein 6.5 - 8.1 g/dL 6.8 7.1 6.9  Total Bilirubin 0.3 - 1.2 mg/dL 0.7 0.4 0.6  Alkaline Phos 38 - 126 U/L 191(H) 197(H) 200(H)  AST 15 - 41 U/L _2 ALT 0 - 44 U/L _3 DIAGNOSTIC IMAGING:  I have independently reviewed the scans and discussed with the patient.   I have reviewed Venita Lick LPN's note and agree with the documentation.  I personally performed a face-to-face visit, made revisions and my assessment and plan is as follows.    ASSESSMENT & PLAN:   Pancreatic cancer metastasized to liver ([place]) 1.  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 - Gemcitabine and Abraxane from normal 2015 through August 2018 with progression. -Gemcitabine and Tarceva from 01/28/2017 through 10/07/2017 with progression. -21 cycles of infusional 5-FU and Onivyde from 10/21/2017-09/08/2018. -7 cycles of FOLFOX from 09/22/2018-12/29/2018. -CT CAP on 01/10/2019 showed increase in size of dominant hepatic lesion near the dome measuring 4.7 cm, previously 2.6 cm.  Other lesions in the liver are stable.  No new lesions seen.  No lesions in the lungs. 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  MSI high was not detected. -4 cycles of gemcitabine (750 mg/m2) and cisplatin (20 mg/m2) day 1 and day 15 from 02/10/2019 through 05/12/2019. -CT CAP on 06/09/2019 reviewed by me shows right hepatic lesion measuring 4 x 3.2 cm, previously 3.9 x 3.6  cm.  Segment 2 lesion measures 2.4 x 1.9 cm, left and right omental nodules increased in size by 5 mm.  However study is limited because of noncontrast. -Cycle 5-day 1 of treatment on 06/14/2019.  She has tolerated last dose reasonably well. -No back pain reported.  We reviewed her labs.  CBC shows normal white count.  LFTs are normal.  Creatinine is improved. -She has received first dose of COVID-19 vaccine.  She will proceed with her treatment today. -She will come back in 2 weeks for follow-up and next cycle. -She reported some urgency of urination.  Denies any dysuria.  We have checked a UA which was normal.  Urine culture is pending.  2.  Weight loss: -She has gained 3 pounds since last visit.  3.  Genetic testing: -Germline  mutation testing was negative on 12/24/2018.  4.  Renal insufficiency: -Her creatinine is better today at 1.18.  She will receive post chemo hydration tomorrow.  5.  Vitamin D deficiency: -She will continue vitamin D 50,000 units weekly.  We will check levels in the future.  6.  Normocytic anemia: -This is from combination of CKD and chemotherapy.  Hemoglobin is 8.4 today.  We will transfuse as needed.     Orders placed this encounter:  Orders Placed This Encounter  Procedures  . Urine culture  . CBC with Differential/Platelet  . Comprehensive metabolic panel  . Magnesium  . Lactate dehydrogenase  . Cancer antigen 19-9  . Urinalysis, Routine w reflex microscopic     Derek Jack, MD [place] [phone]

## 2019-06-29 ENCOUNTER — Inpatient Hospital Stay (HOSPITAL_COMMUNITY): Payer: Medicare HMO

## 2019-06-29 VITALS — BP 160/63 | HR 65 | Temp 96.9°F | Resp 18

## 2019-06-29 DIAGNOSIS — Z5111 Encounter for antineoplastic chemotherapy: Secondary | ICD-10-CM | POA: Diagnosis not present

## 2019-06-29 DIAGNOSIS — C787 Secondary malignant neoplasm of liver and intrahepatic bile duct: Secondary | ICD-10-CM

## 2019-06-29 DIAGNOSIS — D6481 Anemia due to antineoplastic chemotherapy: Secondary | ICD-10-CM

## 2019-06-29 DIAGNOSIS — C259 Malignant neoplasm of pancreas, unspecified: Secondary | ICD-10-CM

## 2019-06-29 LAB — URINE CULTURE: Culture: NO GROWTH

## 2019-06-29 LAB — CANCER ANTIGEN 19-9: CA 19-9: 203 U/mL — ABNORMAL HIGH (ref 0–35)

## 2019-06-29 MED ORDER — SODIUM CHLORIDE 0.9 % IV SOLN
Freq: Once | INTRAVENOUS | Status: AC
  Filled 2019-06-29: qty 1000

## 2019-06-29 MED ORDER — PEGFILGRASTIM-CBQV 6 MG/0.6ML ~~LOC~~ SOSY
6.0000 mg | PREFILLED_SYRINGE | Freq: Once | SUBCUTANEOUS | Status: AC
  Administered 2019-06-29: 6 mg via SUBCUTANEOUS
  Filled 2019-06-29: qty 0.6

## 2019-06-29 MED ORDER — SODIUM CHLORIDE 0.9% FLUSH
10.0000 mL | Freq: Once | INTRAVENOUS | Status: AC | PRN
  Administered 2019-06-29: 10 mL

## 2019-06-29 MED ORDER — HEPARIN SOD (PORK) LOCK FLUSH 100 UNIT/ML IV SOLN
500.0000 [IU] | Freq: Once | INTRAVENOUS | Status: AC | PRN
  Administered 2019-06-29: 500 [IU]

## 2019-06-29 NOTE — Patient Instructions (Signed)
[facility] at [facility]  Discharge Instructions:   _______________________________________________________________  Thank you for choosing [facility] at [facility] to provide your oncology and hematology care.  To afford each patient quality time with our providers, please arrive at least 15 minutes before your scheduled appointment.  You need to re-schedule your appointment if you arrive 10 or more minutes late.  We strive to give you quality time with our providers, and arriving late affects you and other patients whose appointments are after yours.  Also, if you no show three or more times for appointments you may be dismissed from the clinic.  Again, thank you for choosing [facility] at [facility]. Our hope is that these requests will allow you access to exceptional care and in a timely manner. _______________________________________________________________  If you have questions after your visit, please contact our office at [phone] between the hours of 8:30 a.m. and 5:00 p.m. Voicemails left after 4:30 p.m. will not be returned until the following business day. _______________________________________________________________  For prescription refill requests, have your pharmacy contact our office. _______________________________________________________________  Recommendations made by the consultant and any test results will be sent to your referring physician. _______________________________________________________________ 

## 2019-06-29 NOTE — Progress Notes (Signed)
IV hydration given today per MD orders.  Sodium chloride 1 Liter with Potassium chloride 20 mEq and Magnesium Sulfate 2 Grams. Tolerated infusion without adverse affects. Vital signs stable. No complaints at this time. Discharged from clinic ambulatory. F/U with [facility] as scheduled.

## 2019-07-13 ENCOUNTER — Other Ambulatory Visit: Payer: Self-pay

## 2019-07-13 ENCOUNTER — Inpatient Hospital Stay (HOSPITAL_COMMUNITY): Payer: Medicare HMO

## 2019-07-13 ENCOUNTER — Inpatient Hospital Stay (HOSPITAL_COMMUNITY): Payer: Medicare HMO | Attending: Hematology | Admitting: Hematology

## 2019-07-13 VITALS — BP 157/56 | HR 58 | Temp 97.7°F | Resp 18

## 2019-07-13 DIAGNOSIS — C787 Secondary malignant neoplasm of liver and intrahepatic bile duct: Secondary | ICD-10-CM

## 2019-07-13 DIAGNOSIS — C259 Malignant neoplasm of pancreas, unspecified: Secondary | ICD-10-CM

## 2019-07-13 DIAGNOSIS — Z5111 Encounter for antineoplastic chemotherapy: Secondary | ICD-10-CM | POA: Insufficient documentation

## 2019-07-13 DIAGNOSIS — C252 Malignant neoplasm of tail of pancreas: Secondary | ICD-10-CM | POA: Diagnosis present

## 2019-07-13 DIAGNOSIS — Z5189 Encounter for other specified aftercare: Secondary | ICD-10-CM | POA: Insufficient documentation

## 2019-07-13 LAB — CBC WITH DIFFERENTIAL/PLATELET
Abs Immature Granulocytes: 0.07 10*3/uL (ref 0.00–0.07)
Basophils Absolute: 0 10*3/uL (ref 0.0–0.1)
Basophils Relative: 0 %
Eosinophils Absolute: 0.3 10*3/uL (ref 0.0–0.5)
Eosinophils Relative: 2 %
HCT: 25.5 % — ABNORMAL LOW (ref 36.0–46.0)
Hemoglobin: 8.3 g/dL — ABNORMAL LOW (ref 12.0–15.0)
Immature Granulocytes: 1 %
Lymphocytes Relative: 10 %
Lymphs Abs: 1.3 10*3/uL (ref 0.7–4.0)
MCH: 31.1 pg (ref 26.0–34.0)
MCHC: 32.5 g/dL (ref 30.0–36.0)
MCV: 95.5 fL (ref 80.0–100.0)
Monocytes Absolute: 0.7 10*3/uL (ref 0.1–1.0)
Monocytes Relative: 6 %
Neutro Abs: 9.8 10*3/uL — ABNORMAL HIGH (ref 1.7–7.7)
Neutrophils Relative %: 81 %
Platelets: 122 10*3/uL — ABNORMAL LOW (ref 150–400)
RBC: 2.67 MIL/uL — ABNORMAL LOW (ref 3.87–5.11)
RDW: 17.9 % — ABNORMAL HIGH (ref 11.5–15.5)
WBC: 12.1 10*3/uL — ABNORMAL HIGH (ref 4.0–10.5)
nRBC: 0 % (ref 0.0–0.2)

## 2019-07-13 LAB — COMPREHENSIVE METABOLIC PANEL
ALT: 13 U/L (ref 0–44)
AST: 19 U/L (ref 15–41)
Albumin: 3.5 g/dL (ref 3.5–5.0)
Alkaline Phosphatase: 213 U/L — ABNORMAL HIGH (ref 38–126)
Anion gap: 9 (ref 5–15)
BUN: 22 mg/dL (ref 8–23)
CO2: 23 mmol/L (ref 22–32)
Calcium: 8 mg/dL — ABNORMAL LOW (ref 8.9–10.3)
Chloride: 108 mmol/L (ref 98–111)
Creatinine, Ser: 1.31 mg/dL — ABNORMAL HIGH (ref 0.44–1.00)
GFR calc Af Amer: 42 mL/min — ABNORMAL LOW (ref >60)
GFR calc non Af Amer: 36 mL/min — ABNORMAL LOW (ref >60)
Glucose, Bld: 102 mg/dL — ABNORMAL HIGH (ref 70–99)
Potassium: 3.8 mmol/L (ref 3.5–5.1)
Sodium: 140 mmol/L (ref 135–145)
Total Bilirubin: 0.5 mg/dL (ref 0.3–1.2)
Total Protein: 7.1 g/dL (ref 6.5–8.1)

## 2019-07-13 LAB — LACTATE DEHYDROGENASE: LDH: 151 U/L (ref 98–192)

## 2019-07-13 LAB — MAGNESIUM: Magnesium: 1.7 mg/dL (ref 1.7–2.4)

## 2019-07-13 MED ORDER — SODIUM CHLORIDE 0.9 % IV SOLN
20.0000 mg/m2 | Freq: Once | INTRAVENOUS | Status: AC
  Administered 2019-07-13: 31 mg via INTRAVENOUS
  Filled 2019-07-13: qty 31

## 2019-07-13 MED ORDER — HEPARIN SOD (PORK) LOCK FLUSH 100 UNIT/ML IV SOLN
500.0000 [IU] | Freq: Once | INTRAVENOUS | Status: AC | PRN
  Administered 2019-07-13: 500 [IU]

## 2019-07-13 MED ORDER — SODIUM CHLORIDE 0.9 % IV SOLN: Freq: Once | INTRAVENOUS | Status: AC

## 2019-07-13 MED ORDER — PALONOSETRON HCL INJECTION 0.25 MG/5ML
0.2500 mg | Freq: Once | INTRAVENOUS | Status: AC
  Administered 2019-07-13: 0.25 mg via INTRAVENOUS

## 2019-07-13 MED ORDER — SODIUM CHLORIDE 0.9 % IV SOLN
750.0000 mg/m2 | Freq: Once | INTRAVENOUS | Status: AC
  Administered 2019-07-13: 1140 mg via INTRAVENOUS
  Filled 2019-07-13: qty 26.3

## 2019-07-13 MED ORDER — POTASSIUM CHLORIDE 2 MEQ/ML IV SOLN
Freq: Once | INTRAVENOUS | Status: AC
  Filled 2019-07-13: qty 10

## 2019-07-13 MED ORDER — SODIUM CHLORIDE 0.9 % IV SOLN
Freq: Once | INTRAVENOUS | Status: AC
  Filled 2019-07-13: qty 5

## 2019-07-13 MED ORDER — SODIUM CHLORIDE 0.9% FLUSH
10.0000 mL | INTRAVENOUS | Status: DC | PRN
  Administered 2019-07-13: 10 mL

## 2019-07-13 MED ORDER — OXYBUTYNIN CHLORIDE ER 5 MG PO TB24: 5.0000 mg | ORAL_TABLET | Freq: Every day | ORAL | 0 refills | Status: DC

## 2019-07-13 NOTE — Patient Instructions (Signed)
[facility] at [facility] Discharge Instructions  Labs drawn from portacath today   Thank you for choosing [facility] at [facility] to provide your oncology and hematology care.  To afford each patient quality time with our provider, please arrive at least 15 minutes before your scheduled appointment time.   If you have a lab appointment with the Cancer Center please come in thru the Main Entrance and check in at the main information desk.  You need to re-schedule your appointment should you arrive 10 or more minutes late.  We strive to give you quality time with our providers, and arriving late affects you and other patients whose appointments are after yours.  Also, if you no show three or more times for appointments you may be dismissed from the clinic at the providers discretion.     Again, thank you for choosing [facility].  Our hope is that these requests will decrease the amount of time that you wait before being seen by our physicians.       _____________________________________________________________  Should you have questions after your visit to  Cancer Center, please contact our office at [phone] between the hours of 8:00 a.m. and 4:30 p.m.  Voicemails left after 4:00 p.m. will not be returned until the following business day.  For prescription refill requests, have your pharmacy contact our office and allow 72 hours.    Due to Covid, you will need to wear a mask upon entering the hospital. If you do not have a mask, a mask will be given to you at the Main Entrance upon arrival. For doctor visits, patients may have 1 support person with them. For treatment visits, patients can not have anyone with them due to social distancing guidelines and our immunocompromised population.     

## 2019-07-13 NOTE — Progress Notes (Signed)
Patient has been assessed, vital signs and labs have been reviewed by Dr. Delton Coombes. ANC, Creatinine, LFTs, and Platelets are within treatment parameters per Dr. Delton Coombes. The patient is good to proceed with treatment at this time. Patient will return tomorrow for fluids with electrolytes over 2 hours and injection.

## 2019-07-13 NOTE — Patient Instructions (Addendum)
[place] at [facility] Discharge Instructions  You were seen today by Dr. Delton Coombes. He went over your recent lab results. You will have treatment today and come back tomorrow for fluids and injection. He will see you back in 2 weeks for labs, treatment and follow up.   Thank you for choosing [place] at [facility] to provide your oncology and hematology care.  To afford each patient quality time with our provider, please arrive at least 15 minutes before your scheduled appointment time.   If you have a lab appointment with the [facility] please come in thru the  Main Entrance and check in at the main information desk  You need to re-schedule your appointment should you arrive 10 or more minutes late.  We strive to give you quality time with our providers, and arriving late affects you and other patients whose appointments are after yours.  Also, if you no show three or more times for appointments you may be dismissed from the clinic at the providers discretion.     Again, thank you for choosing [facility]  Our hope is that these requests will decrease the amount of time that you wait before being seen by our physicians.       _____________________________________________________________  Should you have questions after your visit to [facility], please contact our office at [phone] between the hours of 8:00 a.m. and 4:30 p.m.  Voicemails left after 4:00 p.m. will not be returned until the following business day.  For prescription refill requests, have your pharmacy contact our office and allow 72 hours.    Cancer Center Support Programs:   > Cancer Support Group  2nd Tuesday of the month 1pm-2pm, Journey Room

## 2019-07-13 NOTE — Progress Notes (Signed)
[place], [place] [address]   CLINIC:  Medical Oncology/Hematology  PCP:  Fayrene Helper, MD [address], [place] [address] [phone]   REASON FOR VISIT:  Follow-up for metastatic pancreatic cancer to the liver   BRIEF ONCOLOGIC HISTORY:  Oncology History  Pancreatic cancer metastasized to liver ([place])  03/10/2014 Initial Diagnosis   Pancreatic cancer metastasized to liver   03/22/2014 - 03/05/2016 Chemotherapy   Abraxane/Gemzar days 1, 8, every 28 days.  Day 15 was cancelled due to leukopenia and thrombocytopenia on day 15 cycle 1.   05/24/2014 Treatment Plan Change   Day 8 of cycle 3 is held with ANC of 1.1   06/05/2014 Imaging   CT C/A/P . Interval decrease in size of the pancreatic tail mass.Improved hepatic metastatic disease. No new lesions. No CT findings for metastatic disease involving the chest.   08/09/2014 Tumor Marker   CA 19-9= 33 (WNL)   10/26/2014 Imaging   MRI- Continued interval decrease in size of the hepatic metastatic lesions and no new lesions are identified. Continued decrease in size of the pancreatic tail lesion.   01/03/2015 Tumor Marker   Results for [name], [name] (MRN [id number]) as of 01/18/2015 12:52  01/03/2015 10:00 CA 19-9: 14    01/17/2015 Imaging   MRI- Response to therapy of hepatic metastasis.  Similar size of a pancreatic tail lesion.   03/20/2015 Imaging   MRI-L spine- Severe disc space narrowing at L2-L3, with endplate reactive changes. Large disc extrusion into the ventral epidural space,central to the RIGHT with a cephalad migrated free fragment. Additional Large disc extrusion into the retroperitone...   03/22/2015 Imaging   CT pelvis- No evidence for metastatic disease within the pelvis.   07/24/2015 Imaging   MRI abd- Continued response to therapy, with no residual detectable liver metastases. No new sites of metastatic disease in the abdomen.    08/15/2015 Code Status      She confirms desire for DNR status.   12/21/2015 Imaging   MRI liver- Severe image degradation due to motion artifact reducing diagnostic sensitivity and specificity. Reduce conspicuity of the pancreatic tail lesion suggesting further improvement. The original liver lesions have resolved.   03/05/2016 Treatment Plan Change   Chemotherapy holiday/Break after 2 years worth of treatment   04/07/2016 Imaging   CT chest- When compared to recent chest CT, new minimally displaced anterior left sixth rib fracture. Slight increase in subcarinal adenopathy   04/28/2016 Imaging   Bone density- BMD as determined from Femur Neck Right is 0.705 g/cm2 with a T-Score of -2.4. This patient is considered osteopenic according to [facility]) criteria. Compared with the prior study on 02/23/2013, the BMD of the lumbar spine/rt. femoral neck show a statistically significant decrease.    05/23/2016 Imaging   MRI abd- 1. Exam is significantly degraded by patient respiratory motion. Consider follow-up exams with CT abdomen with without contrast per pancreatic protocol. 2. Fullness in tail of pancreas is more prominent and potentially increased in size. Recommend close attention on follow-up. (Consider CT as above) 3. No explanation for back pain.   09/11/2016 Imaging   MRI pancreas- Interval progression of pancreatic tail lesion. New differential perfusion right liver with areas of heterogeneity. Underlying metastatic disease in this region not excluded.   09/11/2016 Progression   MRi in conjunction with rising CA 19-9 are indicative of relapse of disease.   01/09/2017 Progression   CT C/A/P: 2.1 x  2.9 cm lesion in the pancreatic tail and abutting the splenic hilum, corresponding to known primary pancreatic neoplasm, mildly increased.  Scattered small hypoenhancing lesions in the liver measuring up to 10 mm, approximately 8-10 in number, suspicious for hepatic metastases.  No  findings specific for metastatic disease in the chest.  Additional ancillary findings as above.    01/27/2017 - 10/20/2017 Chemotherapy   The patient had gemcitabine (GEMZAR) 1,292 mg in sodium chloride 0.9 % 250 mL chemo infusion, 800 mg/m2 = 1,292 mg, Intravenous,  Once, 9 of 12 cycles Administration: 1,292 mg (01/28/2017), 1,292 mg (02/11/2017), 1,292 mg (02/25/2017), 1,292 mg (03/11/2017), 1,292 mg (03/25/2017), 1,292 mg (04/08/2017), 1,292 mg (04/22/2017), 1,292 mg (04/29/2017), 1,292 mg (05/20/2017), 1,292 mg (06/03/2017), 1,292 mg (06/24/2017), 1,292 mg (07/08/2017), 1,292 mg (07/29/2017), 1,292 mg (08/12/2017), 1,292 mg (08/26/2017), 1,292 mg (09/09/2017), 1,292 mg (09/23/2017), 1,292 mg (10/07/2017)  for chemotherapy treatment.    10/21/2017 - 09/22/2018 Chemotherapy   The patient had leucovorin 700 mg in dextrose 5 % 250 mL infusion, 644 mg, Intravenous,  Once, 22 of 22 cycles Administration: 700 mg (10/21/2017), 700 mg (11/04/2017), 700 mg (11/18/2017), 700 mg (12/02/2017), 700 mg (12/16/2017), 700 mg (12/30/2017), 700 mg (01/13/2018), 700 mg (01/27/2018), 700 mg (02/10/2018), 700 mg (02/24/2018), 700 mg (03/10/2018), 700 mg (03/24/2018), 700 mg (04/28/2018), 700 mg (05/17/2018), 700 mg (05/31/2018), 700 mg (06/16/2018), 700 mg (06/30/2018), 700 mg (07/21/2018), 700 mg (08/04/2018), 700 mg (08/25/2018), 700 mg (09/08/2018) ondansetron (ZOFRAN) 8 mg in sodium chloride 0.9 % 50 mL IVPB, , Intravenous,  Once, 21 of 21 cycles Administration:  (11/04/2017),  (01/27/2018),  (02/10/2018),  (02/24/2018),  (03/10/2018),  (03/24/2018),  (04/28/2018),  (05/17/2018),  (05/31/2018),  (06/16/2018),  (06/30/2018),  (07/21/2018),  (08/04/2018),  (08/25/2018),  (09/08/2018) fluorouracil (ADRUCIL) 3,850 mg in sodium chloride 0.9 % 73 mL chemo infusion, 2,400 mg/m2 = 3,850 mg, Intravenous, 1 Day/Dose, 22 of 22 cycles Dose modification: 1,920 mg/m2 (original dose 2,400 mg/m2, Cycle 2, Reason: Provider Judgment, Comment: mucisitis) Administration: 3,850 mg  (10/21/2017), 3,100 mg (11/04/2017), 3,100 mg (11/18/2017), 3,100 mg (12/02/2017), 3,100 mg (12/16/2017), 3,100 mg (12/30/2017), 3,100 mg (01/13/2018), 3,100 mg (01/27/2018), 3,100 mg (02/10/2018), 3,100 mg (02/24/2018), 3,100 mg (03/10/2018), 3,100 mg (03/24/2018), 3,100 mg (04/28/2018), 3,100 mg (05/17/2018), 3,100 mg (05/31/2018), 3,100 mg (06/16/2018), 3,100 mg (06/30/2018), 3,100 mg (07/21/2018), 3,100 mg (08/04/2018), 3,100 mg (08/25/2018), 3,100 mg (09/08/2018) irinotecan LIPOSOME (ONIVYDE) 81.7 mg in sodium chloride 0.9 % 500 mL chemo infusion, 50 mg/m2 = 81.7 mg (100 % of original dose 50 mg/m2), Intravenous, Once, 22 of 22 cycles Dose modification: 50 mg/m2 (original dose 50 mg/m2, Cycle 1, Reason: Provider Judgment), 50 mg/m2 (original dose 50 mg/m2, Cycle 2, Reason: Provider Judgment) Administration: 81.7 mg (10/21/2017), 81.7 mg (11/04/2017), 81.7 mg (11/18/2017), 81.7 mg (12/02/2017), 81.7 mg (12/16/2017), 81.7 mg (12/30/2017), 81.7 mg (01/13/2018), 81.7 mg (01/27/2018), 81.7 mg (02/10/2018), 81.7 mg (02/24/2018), 81.7 mg (03/10/2018), 81.7 mg (03/24/2018), 81.7 mg (04/28/2018), 81.7 mg (05/17/2018), 81.7 mg (05/31/2018), 81.7 mg (06/16/2018), 81.7 mg (06/30/2018), 81.7 mg (07/21/2018), 81.7 mg (08/04/2018), 81.7 mg (08/25/2018), 81.7 mg (09/08/2018)  for chemotherapy treatment.    09/22/2018 - 01/11/2019 Chemotherapy   The patient had palonosetron (ALOXI) injection 0.25 mg, 0.25 mg, Intravenous,  Once, 7 of 8 cycles Administration: 0.25 mg (09/22/2018), 0.25 mg (10/11/2018), 0.25 mg (10/25/2018), 0.25 mg (12/01/2018), 0.25 mg (12/15/2018), 0.25 mg (11/17/2018), 0.25 mg (12/29/2018) pegfilgrastim (NEULASTA) injection 6 mg, 6 mg, Subcutaneous, Once, 5 of 5 cycles Administration: 6 mg (10/13/2018), 6 mg (10/27/2018), 6 mg (12/03/2018),  6 mg (12/17/2018), 6 mg (11/19/2018) pegfilgrastim-cbqv (UDENYCA) injection 6 mg, 6 mg, Subcutaneous, Once, 1 of 2 cycles Administration: 6 mg (12/31/2018) leucovorin 628 mg in dextrose 5 % 250 mL infusion, 400 mg/m2 =  628 mg, Intravenous,  Once, 7 of 8 cycles Administration: 628 mg (09/22/2018), 628 mg (10/11/2018), 628 mg (10/25/2018), 628 mg (12/01/2018), 628 mg (12/15/2018), 628 mg (11/17/2018), 628 mg (12/29/2018) oxaliplatin (ELOXATIN) 100 mg in dextrose 5 % 500 mL chemo infusion, 65 mg/m2 = 100 mg (100 % of original dose 65 mg/m2), Intravenous,  Once, 7 of 8 cycles Dose modification: 65 mg/m2 (original dose 65 mg/m2, Cycle 1, Reason: Provider Judgment) Administration: 100 mg (09/22/2018), 100 mg (10/11/2018), 100 mg (10/25/2018), 100 mg (12/01/2018), 100 mg (12/15/2018), 100 mg (11/17/2018), 100 mg (12/29/2018) fluorouracil (ADRUCIL) chemo injection 500 mg, 320 mg/m2 = 500 mg (100 % of original dose 320 mg/m2), Intravenous,  Once, 7 of 8 cycles Dose modification: 320 mg/m2 (original dose 320 mg/m2, Cycle 1, Reason: Provider Judgment) Administration: 500 mg (09/22/2018), 500 mg (10/11/2018), 500 mg (10/25/2018), 500 mg (12/01/2018), 500 mg (12/15/2018), 500 mg (11/17/2018), 500 mg (12/29/2018) fluorouracil (ADRUCIL) 3,000 mg in sodium chloride 0.9 % 90 mL chemo infusion, 1,920 mg/m2 = 3,000 mg (100 % of original dose 1,920 mg/m2), Intravenous, 1 Day/Dose, 7 of 8 cycles Dose modification: 1,920 mg/m2 (original dose 1,920 mg/m2, Cycle 1, Reason: Provider Judgment) Administration: 3,000 mg (09/22/2018), 3,000 mg (10/11/2018), 3,000 mg (10/25/2018), 3,000 mg (12/01/2018), 3,000 mg (12/15/2018), 3,000 mg (11/17/2018), 3,000 mg (12/29/2018)  for chemotherapy treatment.    12/24/2018 Genetic Testing   Negative genetic testing on the common hereditary cancer panel.  The Common Hereditary Gene Panel offered by [facility] includes sequencing and/or deletion duplication testing of the following 48 genes: APC, ATM, AXIN2, BARD1, BMPR1A, BRCA1, BRCA2, BRIP1, CDH1, CDK4, CDKN2A (p14ARF), CDKN2A (p16INK4a), CHEK2, CTNNA1, DICER1, EPCAM (Deletion/duplication testing only), GREM1 (promoter region deletion/duplication testing only), KIT, MEN1, MLH1, MSH2, MSH3, MSH6,  MUTYH, NBN, NF1, NHTL1, PALB2, PDGFRA, PMS2, POLD1, POLE, PTEN, RAD50, RAD51C, RAD51D, RNF43, SDHB, SDHC, SDHD, SMAD4, SMARCA4. STK11, TP53, TSC1, TSC2, and VHL.  The following genes were evaluated for sequence changes only: SDHA and HOXB13 c.251G>A variant only. The report date is December 24, 2018.  DICER1 VUS identified.  This will not change medical decision making.   02/10/2019 -  Chemotherapy   The patient had palonosetron (ALOXI) injection 0.25 mg, 0.25 mg, Intravenous,  Once, 6 of 7 cycles Administration: 0.25 mg (02/10/2019), 0.25 mg (02/24/2019), 0.25 mg (03/10/2019), 0.25 mg (03/17/2019), 0.25 mg (03/31/2019), 0.25 mg (04/14/2019), 0.25 mg (05/12/2019), 0.25 mg (05/30/2019), 0.25 mg (06/14/2019), 0.25 mg (06/28/2019), 0.25 mg (07/13/2019) pegfilgrastim-jmdb (FULPHILA) injection 6 mg, 6 mg, Subcutaneous,  Once, 5 of 6 cycles Administration: 6 mg (02/25/2019), 6 mg (03/18/2019), 6 mg (05/31/2019), 6 mg (07/14/2019) pegfilgrastim-cbqv (UDENYCA) injection 6 mg, 6 mg, Subcutaneous, Once, 1 of 1 cycle Administration: 6 mg (06/29/2019) CISplatin (PLATINOL) 31 mg in sodium chloride 0.9 % 250 mL chemo infusion, 20 mg/m2 = 31 mg (80 % of original dose 25 mg/m2), Intravenous,  Once, 6 of 7 cycles Dose modification: 20 mg/m2 (80 % of original dose 25 mg/m2, Cycle 1, Reason: Other (see comments), Comment: renal dysfunction) Administration: 31 mg (02/10/2019), 31 mg (02/24/2019), 31 mg (03/10/2019), 31 mg (03/17/2019), 31 mg (03/31/2019), 31 mg (04/14/2019), 31 mg (05/30/2019), 31 mg (06/14/2019), 31 mg (06/28/2019), 31 mg (07/13/2019) gemcitabine (GEMZAR) 1,140 mg in sodium chloride 0.9 % 250 mL chemo infusion, 750 mg/m2 = 1,140 mg (  75 % of original dose 1,000 mg/m2), Intravenous,  Once, 6 of 7 cycles Dose modification: 750 mg/m2 (75 % of original dose 1,000 mg/m2, Cycle 1, Reason: Provider Judgment) Administration: 1,140 mg (02/10/2019), 1,140 mg (02/24/2019), 1,140 mg (03/10/2019), 1,140 mg (03/17/2019), 1,140 mg (03/31/2019),  1,140 mg (04/14/2019), 1,140 mg (05/12/2019), 1,140 mg (05/30/2019), 1,140 mg (06/14/2019), 1,140 mg (06/28/2019), 1,140 mg (07/13/2019) fosaprepitant (EMEND) 150 mg, dexamethasone (DECADRON) 12 mg in sodium chloride 0.9 % 145 mL IVPB, , Intravenous,  Once, 6 of 7 cycles Administration:  (02/10/2019),  (02/24/2019),  (03/10/2019),  (03/17/2019),  (03/31/2019),  (04/14/2019),  (05/12/2019),  (05/30/2019),  (06/14/2019),  (06/28/2019),  (07/13/2019)  for chemotherapy treatment.       CANCER STAGING: Cancer Staging Pancreatic cancer metastasized to liver [facility]) Staging form: Pancreas, AJCC 7th Edition - Clinical: Stage IV (T3, N1, M1) - Signed by Baird Cancer, PA-C on 03/16/2014 - Pathologic: No stage assigned - Unsigned    INTERVAL HISTORY:  Ms. Draughn 86 y.o. female seen for follow-up of metastatic pancreatic cancer and toxicity assessment prior to cycle 6.  She reported nausea on and off but vomited only once.  She reported diarrhea for couple of weeks and took Imodium as needed.  She reported eating well, 3 meals a day.  She complains of urinary urgency.  She is currently taking Myrbetriq 50 mg daily.  Appetite and energy levels are 50%.  REVIEW OF SYSTEMS:  Review of Systems  Respiratory: Positive for shortness of breath.   Gastrointestinal: Positive for diarrhea and nausea.  Genitourinary: Positive for bladder incontinence and frequency.   All other systems reviewed and are negative.    PAST MEDICAL/SURGICAL HISTORY:  Past Medical History:  Diagnosis Date  . Anemia due to antineoplastic chemotherapy 09/12/2015   Started Aranesp 500 mcg on 09/12/2015  . Anxiety   . Chronic diarrhea   . Depression   . DNR (do not resuscitate) 08/16/2015  . Erosive esophagitis   . Family history of breast cancer in female   . Family history of prostate cancer   . GERD (gastroesophageal reflux disease)   . Hx of adenomatous colonic polyps    tubular adenomas, last found in 2008  . Hyperplastic colon polyp  03/19/10   tcs by Dr. Gala Romney  . Hypertension 20 years   . Kidney stone    hx/ crushed   . Opioid contract exists 04/18/2015   With Dr. Moshe Cipro  . Pancreatic cancer ([place]) 02/2014  . Pancreatic cancer metastasized to liver ([place]) 03/10/2014  . Schatzki's ring 08/26/10   Last dilated on EGD by Dr. Trevor Iha [facility], linear gastric erosions, BI hemigastrectomy   Past Surgical History:  Procedure Laterality Date  . BREAST LUMPECTOMY Left   . bunion removal     from both feet   . CHOLECYSTECTOMY  1965   . COLONOSCOPY  03/19/2010   DR Gala Romney,, normal TI, pancolonic diverticula, random colon bx neg., hyperplastic polyps removed  . ESOPHAGOGASTRODUODENOSCOPY  11/22/2003   DR Gala Romney, erosive RE, Billroth I  . ESOPHAGOGASTRODUODENOSCOPY  08/26/10   Dr. Gala Romney- moderate severe ERE, Scahtzki ring s/p dilation, Billroth I, linear gastric erosions, bx-gastric xanthelasma  . LEFT SHOULDER SURGERY  2009   DR HARRISON  . ORIF ANKLE FRACTURE Right 05/22/2013   Procedure: OPEN REDUCTION INTERNAL FIXATION (ORIF) RIGHT ANKLE FRACTURE;  Surgeon: Sanjuana Kava, MD;  Location: [facility];  Service: Orthopedics;  Laterality: Right;  . stomach ulcer  50 years ago    had some of her stomach  removed      SOCIAL HISTORY:  Social History   Socioeconomic History  . Marital status: Widowed    Spouse name: Not on file  . Number of children: 2  . Years of education: 37  . Highest education level: 12th grade  Occupational History  . Occupation: retired from Estate manager/land agent: RETIRED  Tobacco Use  . Smoking status: Current Some Day Smoker    Packs/day: 0.50    Years: 60.00    Pack years: 30.00    Types: Cigarettes    Last attempt to quit: 09/09/2016    Years since quitting: 2.8  . Smokeless tobacco: Never Used  Substance and Sexual Activity  . Alcohol use: No  . Drug use: No  . Sexual activity: Not Currently  Other Topics Concern  . Not on file  Social History Narrative   Lives with son alone    Social  Determinants of Health   Financial Resource Strain:   . Difficulty of Paying Living Expenses: Not on file  Food Insecurity:   . Worried About Charity fundraiser in the Last Year: Not on file  . Ran Out of Food in the Last Year: Not on file  Transportation Needs:   . Lack of Transportation (Medical): Not on file  . Lack of Transportation (Non-Medical): Not on file  Physical Activity:   . Days of Exercise per Week: Not on file  . Minutes of Exercise per Session: Not on file  Stress:   . Feeling of Stress : Not on file  Social Connections:   . Frequency of Communication with Friends and Family: Not on file  . Frequency of Social Gatherings with Friends and Family: Not on file  . Attends Religious Services: Not on file  . Active Member of Clubs or Organizations: Not on file  . Attends Archivist Meetings: Not on file  . Marital Status: Not on file  Intimate Partner Violence:   . Fear of Current or Ex-Partner: Not on file  . Emotionally Abused: Not on file  . Physically Abused: Not on file  . Sexually Abused: Not on file    FAMILY HISTORY:  Family History  Problem Relation Age of Onset  . Hypertension Mother   . Kidney failure Brother        on dialysis  . Diabetes Sister   . Diabetes Sister   . Hypertension Father   . Kidney failure Sister   . Kidney Stones Brother   . Breast cancer Son 84  . Gout Son 68  . Gout Nephew 57  . Liver disease Neg Hx   . Colon cancer Neg Hx     CURRENT MEDICATIONS:  Outpatient Encounter Medications as of 07/13/2019  Medication Sig  . amLODipine (NORVASC) 10 MG tablet TAKE 1 TABLET DAILY  . CISPLATIN IV Inject into the vein. Days 1, 8 every 21 days  . clobetasol (TEMOVATE) 0.05 % external solution APP AA ON SCALP BID PRN NOT TO FACE GROIN OR UNDERARMS  . clonazePAM (KLONOPIN) 0.5 MG tablet Take 1 tablet (0.5 mg total) by mouth at bedtime.  . cycloSPORINE (RESTASIS) 0.05 % ophthalmic emulsion Place 1 drop into both eyes 2 (two)  times daily.  . ergocalciferol (VITAMIN D2) 1.25 MG (50000 UT) capsule Take 1 capsule (50,000 Units total) by mouth once a week.  . fluorometholone (FML) 0.1 % ophthalmic suspension INSTILL 1 DROP INTO EACH EYE THREE TIMES DAILY FOR 14 DAYS  . fluticasone (FLONASE)  50 MCG/ACT nasal spray Place 1 spray into both nostrils daily.  Marland Kitchen GEMCITABINE HCL IV Inject into the vein. Days 1, 8 every 21 days  . lidocaine-prilocaine (EMLA) cream Apply 1 application topically as needed. Apply to portacath site as needed  . metoprolol tartrate (LOPRESSOR) 25 MG tablet TAKE 1 TABLET DAILY FOR BLOOD PRESSURE (DOSE REDUCTION)  . mirtazapine (REMERON) 15 MG tablet Take 15 mg by mouth at bedtime.  . pantoprazole (PROTONIX) 20 MG tablet TAKE 1 TABLET DAILY  . senna-docusate (SENOKOT-S) 8.6-50 MG tablet Take 1 tablet by mouth daily.  . [DISCONTINUED] MYRBETRIQ 50 MG TB24 tablet TAKE 1 TABLET DAILY  . acetaminophen (TYLENOL) 500 MG tablet Take 500 mg by mouth every 6 (six) hours as needed for mild pain or moderate pain.   Marland Kitchen lidocaine (XYLOCAINE) 2 % solution RINSE WITH 5ML AS NEEDED TO RELIEVE MOUTH PAIN.  Marland Kitchen loperamide (IMODIUM A-D) 2 MG tablet Take 2 mg by mouth 4 (four) times daily as needed for diarrhea or loose stools.  Marland Kitchen oxybutynin (DITROPAN XL) 5 MG 24 hr tablet Take 1 tablet (5 mg total) by mouth at bedtime.  . [DISCONTINUED] prochlorperazine (COMPAZINE) 10 MG tablet Take 1 tablet (10 mg total) by mouth every 6 (six) hours as needed (Nausea or vomiting).   No facility-administered encounter medications on file as of 07/13/2019.    ALLERGIES:  Allergies  Allergen Reactions  . Iohexol Hives and Shortness Of Breath    **Patient can not have IV contrast, pt has breakthrough reaction even after 13 hour premeds** Do not give IV contrast PATIENT HAD TO BE TAKEN TO ED, C/O WHELPS, HIVES, DIFFICULTY BREATHING. PATIENT GIVEN IV CONTRAST AFTER 13 HOUR PRE MEDS ON 01/09/2017 WITH NO REACTION NOTED. On 10/19/17 patient had  reaction despite premedication.    Marland Kitchen Aciphex [Rabeprazole Sodium] Other (See Comments)    unknown  . Amlodipine Besylate-Valsartan     Rash to high-dose (5/320)  . Esomeprazole Magnesium   . Omeprazole Other (See Comments)    Patient states "medication didn't work"  . Ciprofloxacin Rash  . Penicillins Swelling and Rash    Has patient had a PCN reaction causing immediate rash, facial/tongue/throat swelling, SOB or lightheadedness with hypotension: Yes Has patient had a PCN reaction causing severe rash involving mucus membranes or skin necrosis: Yes Has patient had a PCN reaction that required hospitalization: No Has patient had a PCN reaction occurring within the last 10 years: yes If all of the above answers are "NO", then may proceed with Cephalosporin use.   [name] [Benzonatate] Itching    Full head, face, neck, and chest itching.      PHYSICAL EXAM:  ECOG Performance status: 1  Vitals:   07/13/19 0757  BP: (!) 141/53  Pulse: (!) 55  Resp: 18  Temp: (!) 97.4 F (36.3 C)  SpO2: 100%   Filed Weights   07/13/19 0757  Weight: 112 lb 12.8 oz (51.2 kg)    Physical Exam Vitals reviewed.  Constitutional:      Appearance: Normal appearance.  Cardiovascular:     Rate and Rhythm: Normal rate and regular rhythm.     Heart sounds: Normal heart sounds.  Pulmonary:     Effort: Pulmonary effort is normal.     Breath sounds: Normal breath sounds.  Abdominal:     General: There is no distension.     Palpations: Abdomen is soft. There is no mass.  Musculoskeletal:        General: No  swelling.  Skin:    General: Skin is warm.  Neurological:     General: No focal deficit present.     Mental Status: She is alert and oriented to person, place, and time.  Psychiatric:        Mood and Affect: Mood normal.        Behavior: Behavior normal.      LABORATORY DATA:  I have reviewed the labs as listed.  CBC    Component Value Date/Time   WBC 12.1 (H) 07/13/2019  0749   RBC 2.67 (L) 07/13/2019 0749   HGB 8.3 (L) 07/13/2019 0749   HCT 25.5 (L) 07/13/2019 0749   PLT 122 (L) 07/13/2019 0749   MCV 95.5 07/13/2019 0749   MCH 31.1 07/13/2019 0749   MCHC 32.5 07/13/2019 0749   RDW 17.9 (H) 07/13/2019 0749   LYMPHSABS 1.3 07/13/2019 0749   MONOABS 0.7 07/13/2019 0749   EOSABS 0.3 07/13/2019 0749   BASOSABS 0.0 07/13/2019 0749   CMP Latest Ref Rng & Units 07/13/2019 06/28/2019 06/14/2019  Glucose 70 - 99 mg/dL 102(H) 92 126(H)  BUN 8 - 23 mg/dL _0 Creatinine 0.44 - 1.00 mg/dL 1.31(H) 1.18(H) 1.44(H)  Sodium 135 - 145 mmol/L 140 140 140  Potassium 3.5 - 5.1 mmol/L 3.8 3.8 3.9  Chloride 98 - 111 mmol/L 108 106 108  CO2 22 - 32 mmol/L _1 Calcium 8.9 - 10.3 mg/dL 8.0(L) 7.9(L) 8.2(L)  Total Protein 6.5 - 8.1 g/dL 7.1 6.8 7.1  Total Bilirubin 0.3 - 1.2 mg/dL 0.5 0.7 0.4  Alkaline Phos 38 - 126 U/L 213(H) 191(H) 197(H)  AST 15 - 41 U/L _2 ALT 0 - 44 U/L _3 DIAGNOSTIC IMAGING:  I have independently reviewed the scans and discussed with the patient.   I have reviewed Venita Lick LPN's note and agree with the documentation.  I personally performed a face-to-face visit, made revisions and my assessment and plan is as follows.    ASSESSMENT & PLAN:   Pancreatic cancer metastasized to liver ([place]) 1.  Metastatic pancreatic cancer to the liver: -5 cycles of gemcitabine (739m/m2) and cisplatin (20 mg/m2) day 1 and day 15 from 02/10/2019 through 06/14/2019. -CT CAP on 06/09/2019 reviewed by me shows right hepatic lesion measuring 4 x 3.2 cm, previously 3.9 x 3.6 cm.  Segment 2 liver lesion measures 2.4 x 1.9 cm, left and right omental nodules increased in size by 5 mm.  However study is limited by lack of contrast. -I have reviewed her labs which show normal white count and platelets.  LFTs are normal. -She will proceed with day 1 cycle 6 today.  She will be reevaluated in 2 weeks.  2.  Weight loss: -She reports that she  has been eating well but has lost about 2 pounds. -She reports she had diarrhea for 2 weeks and used Imodium as needed. -We will closely monitor her weight.  3.  Genetic testing: -Germline mutation testing was negative on 12/24/2018.  4.  Renal insufficiency: -Creatinine today is slightly up at 1.32.  Previously this was 1.18. -She will receive 1 L of fluids. -We will renew her potassium.  5.  Vitamin D deficiency: -She will continue vitamin D 50,000 units weekly.  6.  Normocytic anemia: -This is from combination of CKD and chemotherapy. -Hemoglobin today is 8.3.  No transfusion needed.  7.  Urinary urgency: -She is currently on Myrbetriq  50 mg daily.  She still has urinary urgency. -We will switch her to Ditropan XL 5 mg daily.  We talked about side effects in detail.     Orders placed this encounter:  Orders Placed This Encounter  Procedures  . CBC with Differential/Platelet  . Comprehensive metabolic panel  . Vitamin D 25 hydroxy  . Magnesium     Derek Jack, MD [place] [phone]

## 2019-07-13 NOTE — Assessment & Plan Note (Addendum)
1.  Metastatic pancreatic cancer to the liver: -5 cycles of gemcitabine (750mg /m2) and cisplatin (20 mg/m2) day 1 and day 15 from 02/10/2019 through 06/14/2019. -CT CAP on 06/09/2019 reviewed by me shows right hepatic lesion measuring 4 x 3.2 cm, previously 3.9 x 3.6 cm.  Segment 2 liver lesion measures 2.4 x 1.9 cm, left and right omental nodules increased in size by 5 mm.  However study is limited by lack of contrast. -I have reviewed her labs which show normal white count and platelets.  LFTs are normal. -She will proceed with day 1 cycle 6 today.  She will be reevaluated in 2 weeks.  2.  Weight loss: -She reports that she has been eating well but has lost about 2 pounds. -She reports she had diarrhea for 2 weeks and used Imodium as needed. -We will closely monitor her weight.  3.  Genetic testing: -Germline mutation testing was negative on 12/24/2018.  4.  Renal insufficiency: -Creatinine today is slightly up at 1.32.  Previously this was 1.18. -She will receive 1 L of fluids. -We will renew her potassium.  5.  Vitamin D deficiency: -She will continue vitamin D 50,000 units weekly.  6.  Normocytic anemia: -This is from combination of CKD and chemotherapy. -Hemoglobin today is 8.3.  No transfusion needed.  7.  Urinary urgency: -She is currently on Myrbetriq 50 mg daily.  She still has urinary urgency. -We will switch her to Ditropan XL 5 mg daily.  We talked about side effects in detail.

## 2019-07-13 NOTE — Patient Instructions (Signed)
[facility] Discharge Instructions for Patients Receiving Chemotherapy  Today you received the following chemotherapy agents   To help prevent nausea and vomiting after your treatment, we encourage you to take your nausea medication   If you develop nausea and vomiting that is not controlled by your nausea medication, call the clinic.   BELOW ARE SYMPTOMS THAT SHOULD BE REPORTED IMMEDIATELY:  *FEVER GREATER THAN 100.5 F  *CHILLS WITH OR WITHOUT FEVER  NAUSEA AND VOMITING THAT IS NOT CONTROLLED WITH YOUR NAUSEA MEDICATION  *UNUSUAL SHORTNESS OF BREATH  *UNUSUAL BRUISING OR BLEEDING  TENDERNESS IN MOUTH AND THROAT WITH OR WITHOUT PRESENCE OF ULCERS  *URINARY PROBLEMS  *BOWEL PROBLEMS  UNUSUAL RASH Items with * indicate a potential emergency and should be followed up as soon as possible.  Feel free to call the clinic should you have any questions or concerns. The clinic phone number is [phone].  Please show the CHEMO ALERT CARD at check-in to the Emergency Department and triage nurse.   

## 2019-07-13 NOTE — Progress Notes (Signed)
Patient presents today for treatment and follow up visit with Dr. Delton Coombes. MAR reviewed. Vital signs within parameters for treatment. Labs pending. Patient has no complaints of any changes since the last visit. Patient has no complaints of pain today.   Message received from [facility] LPN/ Dr. Delton Coombes to proceed with treatment. Patient to return tomorrow per note for supportive therapy hydration and injection.    Voided 150 mls .   Treatment given today per MD orders. Tolerated infusion without adverse affects. Vital signs stable. No complaints at this time. Discharged from clinic ambulatory. F/U with [facility] as scheduled.

## 2019-07-14 ENCOUNTER — Inpatient Hospital Stay (HOSPITAL_COMMUNITY): Payer: Medicare HMO

## 2019-07-14 ENCOUNTER — Encounter (HOSPITAL_COMMUNITY): Payer: Self-pay

## 2019-07-14 VITALS — BP 159/54 | HR 64 | Temp 97.3°F | Resp 18

## 2019-07-14 DIAGNOSIS — Z5111 Encounter for antineoplastic chemotherapy: Secondary | ICD-10-CM | POA: Diagnosis not present

## 2019-07-14 DIAGNOSIS — D6481 Anemia due to antineoplastic chemotherapy: Secondary | ICD-10-CM

## 2019-07-14 DIAGNOSIS — C259 Malignant neoplasm of pancreas, unspecified: Secondary | ICD-10-CM

## 2019-07-14 LAB — CANCER ANTIGEN 19-9: CA 19-9: 253 U/mL — ABNORMAL HIGH (ref 0–35)

## 2019-07-14 MED ORDER — SODIUM CHLORIDE 0.9% FLUSH
10.0000 mL | INTRAVENOUS | Status: DC | PRN
  Administered 2019-07-14: 10 mL via INTRAVENOUS

## 2019-07-14 MED ORDER — SODIUM CHLORIDE 0.9 % IV SOLN
Freq: Once | INTRAVENOUS | Status: AC
  Filled 2019-07-14: qty 1000

## 2019-07-14 MED ORDER — HEPARIN SOD (PORK) LOCK FLUSH 100 UNIT/ML IV SOLN
500.0000 [IU] | Freq: Once | INTRAVENOUS | Status: AC
  Administered 2019-07-14: 500 [IU] via INTRAVENOUS

## 2019-07-14 MED ORDER — PEGFILGRASTIM-JMDB 6 MG/0.6ML ~~LOC~~ SOSY
PREFILLED_SYRINGE | SUBCUTANEOUS | Status: AC
  Filled 2019-07-14: qty 0.6

## 2019-07-14 MED ORDER — PEGFILGRASTIM-JMDB 6 MG/0.6ML ~~LOC~~ SOSY
6.0000 mg | PREFILLED_SYRINGE | Freq: Once | SUBCUTANEOUS | Status: AC
  Administered 2019-07-14: 6 mg via SUBCUTANEOUS

## 2019-07-14 NOTE — Patient Instructions (Signed)
[place] at [facility] Discharge Instructions  Received IV hydration with magnesium and potassium as well as Fulphila injection. Follow-up as scheduled. Call clinic for any questions or concerns   Thank you for choosing [place] at [facility] to provide your oncology and hematology care.  To afford each patient quality time with our provider, please arrive at least 15 minutes before your scheduled appointment time.   If you have a lab appointment with the [facility] please come in thru the Main Entrance and check in at the main information desk.  You need to re-schedule your appointment should you arrive 10 or more minutes late.  We strive to give you quality time with our providers, and arriving late affects you and other patients whose appointments are after yours.  Also, if you no show three or more times for appointments you may be dismissed from the clinic at the providers discretion.     Again, thank you for choosing [facility].  Our hope is that these requests will decrease the amount of time that you wait before being seen by our physicians.       _____________________________________________________________  Should you have questions after your visit to [facility], please contact our office at [phone] between the hours of 8:00 a.m. and 4:30 p.m.  Voicemails left after 4:00 p.m. will not be returned until the following business day.  For prescription refill requests, have your pharmacy contact our office and allow 72 hours.    Due to Covid, you will need to wear a mask upon entering the hospital. If you do not have a mask, a mask will be given to you at the Main Entrance upon arrival. For doctor visits, patients may have 1 support person with them. For treatment visits, patients can not have anyone with them due to social distancing guidelines and our immunocompromised population.

## 2019-07-14 NOTE — Progress Notes (Signed)
[name] tolerated IV hydration with magnesium and potassium and Fulphila injection well without complaints or incident. VSS upon discharge. Pt discharged self ambulatory using her cane in satisfactory condition

## 2019-07-25 NOTE — Progress Notes (Signed)
..  Pharmacist Chemotherapy Monitoring - Follow Up Assessment    I verify that I have reviewed each item in the below checklist:  Regimen:  . Patient due for treatment based on regimen frequency. . Plan date matches the patient's scheduled date. . Premedications are appropriate for the patient's regimen. . Assessed if patient has a supportive care therapy plans, and if patient is due for treatment.  Organ Function and Labs: . Appropriate labs and drug-specific monitoring are ordered prior to the patient's treatment.  Dose Assessment: . If applicable to patient's regimen, lifetime cumulative doses have been properly documented and assessed.  Toxicity Monitoring/Prevention: . Patient has appropriate take home medications prescribed.  . Medication allergies and previous infusion related reactions, if applicable, have been reviewed and addressed.  Order Review: . Treatment plan orders signed and/or patient is scheduled to see a provider prior to their treatment.  Plan for follow-up and/or issues identified: . No . I-vent associated with next due treatment: No . MD and/or nursing notified: No  Wynona Neat 07/25/2019 10:51 AM

## 2019-07-27 ENCOUNTER — Encounter (HOSPITAL_COMMUNITY): Payer: Self-pay | Admitting: Hematology

## 2019-07-27 ENCOUNTER — Other Ambulatory Visit: Payer: Self-pay

## 2019-07-27 ENCOUNTER — Encounter (HOSPITAL_COMMUNITY): Payer: Self-pay

## 2019-07-27 ENCOUNTER — Inpatient Hospital Stay (HOSPITAL_BASED_OUTPATIENT_CLINIC_OR_DEPARTMENT_OTHER): Payer: Medicare HMO | Admitting: Hematology

## 2019-07-27 ENCOUNTER — Inpatient Hospital Stay (HOSPITAL_COMMUNITY): Payer: Medicare HMO

## 2019-07-27 VITALS — BP 158/54 | HR 64 | Temp 97.5°F | Resp 16

## 2019-07-27 DIAGNOSIS — C787 Secondary malignant neoplasm of liver and intrahepatic bile duct: Secondary | ICD-10-CM

## 2019-07-27 DIAGNOSIS — C259 Malignant neoplasm of pancreas, unspecified: Secondary | ICD-10-CM | POA: Diagnosis not present

## 2019-07-27 DIAGNOSIS — D6481 Anemia due to antineoplastic chemotherapy: Secondary | ICD-10-CM

## 2019-07-27 DIAGNOSIS — Z5111 Encounter for antineoplastic chemotherapy: Secondary | ICD-10-CM | POA: Diagnosis not present

## 2019-07-27 LAB — CBC WITH DIFFERENTIAL/PLATELET
Abs Immature Granulocytes: 0.07 10*3/uL (ref 0.00–0.07)
Basophils Absolute: 0 10*3/uL (ref 0.0–0.1)
Basophils Relative: 0 %
Eosinophils Absolute: 0.2 10*3/uL (ref 0.0–0.5)
Eosinophils Relative: 2 %
HCT: 24.5 % — ABNORMAL LOW (ref 36.0–46.0)
Hemoglobin: 7.9 g/dL — ABNORMAL LOW (ref 12.0–15.0)
Immature Granulocytes: 1 %
Lymphocytes Relative: 11 %
Lymphs Abs: 1.4 10*3/uL (ref 0.7–4.0)
MCH: 30.9 pg (ref 26.0–34.0)
MCHC: 32.2 g/dL (ref 30.0–36.0)
MCV: 95.7 fL (ref 80.0–100.0)
Monocytes Absolute: 0.7 10*3/uL (ref 0.1–1.0)
Monocytes Relative: 6 %
Neutro Abs: 10.3 10*3/uL — ABNORMAL HIGH (ref 1.7–7.7)
Neutrophils Relative %: 80 %
Platelets: 97 10*3/uL — ABNORMAL LOW (ref 150–400)
RBC: 2.56 MIL/uL — ABNORMAL LOW (ref 3.87–5.11)
RDW: 18.1 % — ABNORMAL HIGH (ref 11.5–15.5)
WBC: 12.7 10*3/uL — ABNORMAL HIGH (ref 4.0–10.5)
nRBC: 0 % (ref 0.0–0.2)

## 2019-07-27 LAB — COMPREHENSIVE METABOLIC PANEL
ALT: 14 U/L (ref 0–44)
AST: 17 U/L (ref 15–41)
Albumin: 3.5 g/dL (ref 3.5–5.0)
Alkaline Phosphatase: 226 U/L — ABNORMAL HIGH (ref 38–126)
Anion gap: 9 (ref 5–15)
BUN: 25 mg/dL — ABNORMAL HIGH (ref 8–23)
CO2: 24 mmol/L (ref 22–32)
Calcium: 8 mg/dL — ABNORMAL LOW (ref 8.9–10.3)
Chloride: 106 mmol/L (ref 98–111)
Creatinine, Ser: 1.53 mg/dL — ABNORMAL HIGH (ref 0.44–1.00)
GFR calc Af Amer: 35 mL/min — ABNORMAL LOW (ref >60)
GFR calc non Af Amer: 30 mL/min — ABNORMAL LOW (ref >60)
Glucose, Bld: 106 mg/dL — ABNORMAL HIGH (ref 70–99)
Potassium: 4.1 mmol/L (ref 3.5–5.1)
Sodium: 139 mmol/L (ref 135–145)
Total Bilirubin: 0.3 mg/dL (ref 0.3–1.2)
Total Protein: 7.2 g/dL (ref 6.5–8.1)

## 2019-07-27 LAB — VITAMIN D 25 HYDROXY (VIT D DEFICIENCY, FRACTURES): Vit D, 25-Hydroxy: 8.48 ng/mL — ABNORMAL LOW (ref 30–100)

## 2019-07-27 LAB — MAGNESIUM: Magnesium: 1.7 mg/dL (ref 1.7–2.4)

## 2019-07-27 MED ORDER — SODIUM CHLORIDE 0.9% FLUSH
10.0000 mL | Freq: Once | INTRAVENOUS | Status: AC | PRN
  Administered 2019-07-27: 10 mL

## 2019-07-27 MED ORDER — SODIUM CHLORIDE 0.9 % IV SOLN
Freq: Once | INTRAVENOUS | Status: AC
  Filled 2019-07-27: qty 1000

## 2019-07-27 MED ORDER — HEPARIN SOD (PORK) LOCK FLUSH 100 UNIT/ML IV SOLN
500.0000 [IU] | Freq: Once | INTRAVENOUS | Status: AC | PRN
  Administered 2019-07-27: 500 [IU]

## 2019-07-27 NOTE — Patient Instructions (Addendum)
[place] at [facility] Discharge Instructions  You were seen today by Dr. Delton Coombes. He went over your recent lab results. He will hold your treatment today and give you fluids. He will see you back in 1 week for labs, treatment and follow up.   Thank you for choosing [place] at [facility] to provide your oncology and hematology care.  To afford each patient quality time with our provider, please arrive at least 15 minutes before your scheduled appointment time.   If you have a lab appointment with the [facility] please come in thru the  Main Entrance and check in at the main information desk  You need to re-schedule your appointment should you arrive 10 or more minutes late.  We strive to give you quality time with our providers, and arriving late affects you and other patients whose appointments are after yours.  Also, if you no show three or more times for appointments you may be dismissed from the clinic at the providers discretion.     Again, thank you for choosing [facility].  Our hope is that these requests will decrease the amount of time that you wait before being seen by our physicians.       _____________________________________________________________  Should you have questions after your visit to [facility], please contact our office at [phone] between the hours of 8:00 a.m. and 4:30 p.m.  Voicemails left after 4:00 p.m. will not be returned until the following business day.  For prescription refill requests, have your pharmacy contact our office and allow 72 hours.    Cancer Center Support Programs:   > Cancer Support Group  2nd Tuesday of the month 1pm-2pm, Journey Room

## 2019-07-27 NOTE — Progress Notes (Signed)
0848 Labs reviewed with and pt seen by Dr. Delton Coombes and pt's chemo tx to be held today and rescheduled for next week with only IV hydration with magnesium and potassium to be given over 2 hours today per MD                                                               [name] tolerated IV hydration well without complaints or incident. VSS upon discharge. Pt discharged self ambulatory using her cane in satisfactory condition.

## 2019-07-27 NOTE — Progress Notes (Signed)
[place], [place] [address]   CLINIC:  Medical Oncology/Hematology  PCP:  Fayrene Helper, MD [address], [place]  [address] [phone]   REASON FOR VISIT:  Follow-up for metastatic pancreatic cancer to the liver   BRIEF ONCOLOGIC HISTORY:  Oncology History  Pancreatic cancer metastasized to liver ([place])  03/10/2014 Initial Diagnosis   Pancreatic cancer metastasized to liver   03/22/2014 - 03/05/2016 Chemotherapy   Abraxane/Gemzar days 1, 8, every 28 days.  Day 15 was cancelled due to leukopenia and thrombocytopenia on day 15 cycle 1.   05/24/2014 Treatment Plan Change   Day 8 of cycle 3 is held with ANC of 1.1   06/05/2014 Imaging   CT C/A/P . Interval decrease in size of the pancreatic tail mass.Improved hepatic metastatic disease. No new lesions. No CT findings for metastatic disease involving the chest.   08/09/2014 Tumor Marker   CA 19-9= 33 (WNL)   10/26/2014 Imaging   MRI- Continued interval decrease in size of the hepatic metastatic lesions and no new lesions are identified. Continued decrease in size of the pancreatic tail lesion.   01/03/2015 Tumor Marker   Results for ANNAYA, BANGERT (MRN [id number]) as of 01/18/2015 12:52  01/03/2015 10:00 CA 19-9: 14    01/17/2015 Imaging   MRI- Response to therapy of hepatic metastasis.  Similar size of a pancreatic tail lesion.   03/20/2015 Imaging   MRI-L spine- Severe disc space narrowing at L2-L3, with endplate reactive changes. Large disc extrusion into the ventral epidural space,central to the RIGHT with a cephalad migrated free fragment. Additional Large disc extrusion into the retroperitone...   03/22/2015 Imaging   CT pelvis- No evidence for metastatic disease within the pelvis.   07/24/2015 Imaging   MRI abd- Continued response to therapy, with no residual detectable liver metastases. No new sites of metastatic disease in the abdomen.    08/15/2015 Code Status      She confirms desire for DNR status.   12/21/2015 Imaging   MRI liver- Severe image degradation due to motion artifact reducing diagnostic sensitivity and specificity. Reduce conspicuity of the pancreatic tail lesion suggesting further improvement. The original liver lesions have resolved.   03/05/2016 Treatment Plan Change   Chemotherapy holiday/Break after 2 years worth of treatment   04/07/2016 Imaging   CT chest- When compared to recent chest CT, new minimally displaced anterior left sixth rib fracture. Slight increase in subcarinal adenopathy   04/28/2016 Imaging   Bone density- BMD as determined from Femur Neck Right is 0.705 g/cm2 with a T-Score of -2.4. This patient is considered osteopenic according to [facility]) criteria. Compared with the prior study on 02/23/2013, the BMD of the lumbar spine/rt. femoral neck show a statistically significant decrease.    05/23/2016 Imaging   MRI abd- 1. Exam is significantly degraded by patient respiratory motion. Consider follow-up exams with CT abdomen with without contrast per pancreatic protocol. 2. Fullness in tail of pancreas is more prominent and potentially increased in size. Recommend close attention on follow-up. (Consider CT as above) 3. No explanation for back pain.   09/11/2016 Imaging   MRI pancreas- Interval progression of pancreatic tail lesion. New differential perfusion right liver with areas of heterogeneity. Underlying metastatic disease in this region not excluded.   09/11/2016 Progression   MRi in conjunction with rising CA 19-9 are indicative of relapse of disease.   01/09/2017 Progression   CT C/A/P: 2.1 x  2.9 cm lesion in the pancreatic tail and abutting the splenic hilum, corresponding to known primary pancreatic neoplasm, mildly increased.  Scattered small hypoenhancing lesions in the liver measuring up to 10 mm, approximately 8-10 in number, suspicious for hepatic metastases.  No  findings specific for metastatic disease in the chest.  Additional ancillary findings as above.    01/27/2017 - 10/20/2017 Chemotherapy   The patient had gemcitabine (GEMZAR) 1,292 mg in sodium chloride 0.9 % 250 mL chemo infusion, 800 mg/m2 = 1,292 mg, Intravenous,  Once, 9 of 12 cycles Administration: 1,292 mg (01/28/2017), 1,292 mg (02/11/2017), 1,292 mg (02/25/2017), 1,292 mg (03/11/2017), 1,292 mg (03/25/2017), 1,292 mg (04/08/2017), 1,292 mg (04/22/2017), 1,292 mg (04/29/2017), 1,292 mg (05/20/2017), 1,292 mg (06/03/2017), 1,292 mg (06/24/2017), 1,292 mg (07/08/2017), 1,292 mg (07/29/2017), 1,292 mg (08/12/2017), 1,292 mg (08/26/2017), 1,292 mg (09/09/2017), 1,292 mg (09/23/2017), 1,292 mg (10/07/2017)  for chemotherapy treatment.    10/21/2017 - 09/22/2018 Chemotherapy   The patient had leucovorin 700 mg in dextrose 5 % 250 mL infusion, 644 mg, Intravenous,  Once, 22 of 22 cycles Administration: 700 mg (10/21/2017), 700 mg (11/04/2017), 700 mg (11/18/2017), 700 mg (12/02/2017), 700 mg (12/16/2017), 700 mg (12/30/2017), 700 mg (01/13/2018), 700 mg (01/27/2018), 700 mg (02/10/2018), 700 mg (02/24/2018), 700 mg (03/10/2018), 700 mg (03/24/2018), 700 mg (04/28/2018), 700 mg (05/17/2018), 700 mg (05/31/2018), 700 mg (06/16/2018), 700 mg (06/30/2018), 700 mg (07/21/2018), 700 mg (08/04/2018), 700 mg (08/25/2018), 700 mg (09/08/2018) ondansetron (ZOFRAN) 8 mg in sodium chloride 0.9 % 50 mL IVPB, , Intravenous,  Once, 21 of 21 cycles Administration:  (11/04/2017),  (01/27/2018),  (02/10/2018),  (02/24/2018),  (03/10/2018),  (03/24/2018),  (04/28/2018),  (05/17/2018),  (05/31/2018),  (06/16/2018),  (06/30/2018),  (07/21/2018),  (08/04/2018),  (08/25/2018),  (09/08/2018) fluorouracil (ADRUCIL) 3,850 mg in sodium chloride 0.9 % 73 mL chemo infusion, 2,400 mg/m2 = 3,850 mg, Intravenous, 1 Day/Dose, 22 of 22 cycles Dose modification: 1,920 mg/m2 (original dose 2,400 mg/m2, Cycle 2, Reason: Provider Judgment, Comment: mucisitis) Administration: 3,850 mg  (10/21/2017), 3,100 mg (11/04/2017), 3,100 mg (11/18/2017), 3,100 mg (12/02/2017), 3,100 mg (12/16/2017), 3,100 mg (12/30/2017), 3,100 mg (01/13/2018), 3,100 mg (01/27/2018), 3,100 mg (02/10/2018), 3,100 mg (02/24/2018), 3,100 mg (03/10/2018), 3,100 mg (03/24/2018), 3,100 mg (04/28/2018), 3,100 mg (05/17/2018), 3,100 mg (05/31/2018), 3,100 mg (06/16/2018), 3,100 mg (06/30/2018), 3,100 mg (07/21/2018), 3,100 mg (08/04/2018), 3,100 mg (08/25/2018), 3,100 mg (09/08/2018) irinotecan LIPOSOME (ONIVYDE) 81.7 mg in sodium chloride 0.9 % 500 mL chemo infusion, 50 mg/m2 = 81.7 mg (100 % of original dose 50 mg/m2), Intravenous, Once, 22 of 22 cycles Dose modification: 50 mg/m2 (original dose 50 mg/m2, Cycle 1, Reason: Provider Judgment), 50 mg/m2 (original dose 50 mg/m2, Cycle 2, Reason: Provider Judgment) Administration: 81.7 mg (10/21/2017), 81.7 mg (11/04/2017), 81.7 mg (11/18/2017), 81.7 mg (12/02/2017), 81.7 mg (12/16/2017), 81.7 mg (12/30/2017), 81.7 mg (01/13/2018), 81.7 mg (01/27/2018), 81.7 mg (02/10/2018), 81.7 mg (02/24/2018), 81.7 mg (03/10/2018), 81.7 mg (03/24/2018), 81.7 mg (04/28/2018), 81.7 mg (05/17/2018), 81.7 mg (05/31/2018), 81.7 mg (06/16/2018), 81.7 mg (06/30/2018), 81.7 mg (07/21/2018), 81.7 mg (08/04/2018), 81.7 mg (08/25/2018), 81.7 mg (09/08/2018)  for chemotherapy treatment.    09/22/2018 - 01/11/2019 Chemotherapy   The patient had palonosetron (ALOXI) injection 0.25 mg, 0.25 mg, Intravenous,  Once, 7 of 8 cycles Administration: 0.25 mg (09/22/2018), 0.25 mg (10/11/2018), 0.25 mg (10/25/2018), 0.25 mg (12/01/2018), 0.25 mg (12/15/2018), 0.25 mg (11/17/2018), 0.25 mg (12/29/2018) pegfilgrastim (NEULASTA) injection 6 mg, 6 mg, Subcutaneous, Once, 5 of 5 cycles Administration: 6 mg (10/13/2018), 6 mg (10/27/2018), 6 mg (12/03/2018),  6 mg (12/17/2018), 6 mg (11/19/2018) pegfilgrastim-cbqv (UDENYCA) injection 6 mg, 6 mg, Subcutaneous, Once, 1 of 2 cycles Administration: 6 mg (12/31/2018) leucovorin 628 mg in dextrose 5 % 250 mL infusion, 400 mg/m2 =  628 mg, Intravenous,  Once, 7 of 8 cycles Administration: 628 mg (09/22/2018), 628 mg (10/11/2018), 628 mg (10/25/2018), 628 mg (12/01/2018), 628 mg (12/15/2018), 628 mg (11/17/2018), 628 mg (12/29/2018) oxaliplatin (ELOXATIN) 100 mg in dextrose 5 % 500 mL chemo infusion, 65 mg/m2 = 100 mg (100 % of original dose 65 mg/m2), Intravenous,  Once, 7 of 8 cycles Dose modification: 65 mg/m2 (original dose 65 mg/m2, Cycle 1, Reason: Provider Judgment) Administration: 100 mg (09/22/2018), 100 mg (10/11/2018), 100 mg (10/25/2018), 100 mg (12/01/2018), 100 mg (12/15/2018), 100 mg (11/17/2018), 100 mg (12/29/2018) fluorouracil (ADRUCIL) chemo injection 500 mg, 320 mg/m2 = 500 mg (100 % of original dose 320 mg/m2), Intravenous,  Once, 7 of 8 cycles Dose modification: 320 mg/m2 (original dose 320 mg/m2, Cycle 1, Reason: Provider Judgment) Administration: 500 mg (09/22/2018), 500 mg (10/11/2018), 500 mg (10/25/2018), 500 mg (12/01/2018), 500 mg (12/15/2018), 500 mg (11/17/2018), 500 mg (12/29/2018) fluorouracil (ADRUCIL) 3,000 mg in sodium chloride 0.9 % 90 mL chemo infusion, 1,920 mg/m2 = 3,000 mg (100 % of original dose 1,920 mg/m2), Intravenous, 1 Day/Dose, 7 of 8 cycles Dose modification: 1,920 mg/m2 (original dose 1,920 mg/m2, Cycle 1, Reason: Provider Judgment) Administration: 3,000 mg (09/22/2018), 3,000 mg (10/11/2018), 3,000 mg (10/25/2018), 3,000 mg (12/01/2018), 3,000 mg (12/15/2018), 3,000 mg (11/17/2018), 3,000 mg (12/29/2018)  for chemotherapy treatment.    12/24/2018 Genetic Testing   Negative genetic testing on the common hereditary cancer panel.  The Common Hereditary Gene Panel offered by [facility] includes sequencing and/or deletion duplication testing of the following 48 genes: APC, ATM, AXIN2, BARD1, BMPR1A, BRCA1, BRCA2, BRIP1, CDH1, CDK4, CDKN2A (p14ARF), CDKN2A (p16INK4a), CHEK2, CTNNA1, DICER1, EPCAM (Deletion/duplication testing only), GREM1 (promoter region deletion/duplication testing only), KIT, MEN1, MLH1, MSH2, MSH3, MSH6,  MUTYH, NBN, NF1, NHTL1, PALB2, PDGFRA, PMS2, POLD1, POLE, PTEN, RAD50, RAD51C, RAD51D, RNF43, SDHB, SDHC, SDHD, SMAD4, SMARCA4. STK11, TP53, TSC1, TSC2, and VHL.  The following genes were evaluated for sequence changes only: SDHA and HOXB13 c.251G>A variant only. The report date is December 24, 2018.  DICER1 VUS identified.  This will not change medical decision making.   02/10/2019 -  Chemotherapy   The patient had palonosetron (ALOXI) injection 0.25 mg, 0.25 mg, Intravenous,  Once, 6 of 7 cycles Administration: 0.25 mg (02/10/2019), 0.25 mg (02/24/2019), 0.25 mg (03/10/2019), 0.25 mg (03/17/2019), 0.25 mg (03/31/2019), 0.25 mg (04/14/2019), 0.25 mg (05/12/2019), 0.25 mg (05/30/2019), 0.25 mg (06/14/2019), 0.25 mg (06/28/2019), 0.25 mg (07/13/2019) pegfilgrastim-jmdb (FULPHILA) injection 6 mg, 6 mg, Subcutaneous,  Once, 5 of 6 cycles Administration: 6 mg (02/25/2019), 6 mg (03/18/2019), 6 mg (05/31/2019), 6 mg (07/14/2019) pegfilgrastim-cbqv (UDENYCA) injection 6 mg, 6 mg, Subcutaneous, Once, 1 of 1 cycle Administration: 6 mg (06/29/2019) CISplatin (PLATINOL) 31 mg in sodium chloride 0.9 % 250 mL chemo infusion, 20 mg/m2 = 31 mg (80 % of original dose 25 mg/m2), Intravenous,  Once, 6 of 7 cycles Dose modification: 20 mg/m2 (80 % of original dose 25 mg/m2, Cycle 1, Reason: Other (see comments), Comment: renal dysfunction) Administration: 31 mg (02/10/2019), 31 mg (02/24/2019), 31 mg (03/10/2019), 31 mg (03/17/2019), 31 mg (03/31/2019), 31 mg (04/14/2019), 31 mg (05/30/2019), 31 mg (06/14/2019), 31 mg (06/28/2019), 31 mg (07/13/2019) gemcitabine (GEMZAR) 1,140 mg in sodium chloride 0.9 % 250 mL chemo infusion, 750 mg/m2 = 1,140 mg (  75 % of original dose 1,000 mg/m2), Intravenous,  Once, 6 of 7 cycles Dose modification: 750 mg/m2 (75 % of original dose 1,000 mg/m2, Cycle 1, Reason: Provider Judgment) Administration: 1,140 mg (02/10/2019), 1,140 mg (02/24/2019), 1,140 mg (03/10/2019), 1,140 mg (03/17/2019), 1,140 mg (03/31/2019),  1,140 mg (04/14/2019), 1,140 mg (05/12/2019), 1,140 mg (05/30/2019), 1,140 mg (06/14/2019), 1,140 mg (06/28/2019), 1,140 mg (07/13/2019) fosaprepitant (EMEND) 150 mg, dexamethasone (DECADRON) 12 mg in sodium chloride 0.9 % 145 mL IVPB, , Intravenous,  Once, 6 of 7 cycles Administration:  (02/10/2019),  (02/24/2019),  (03/10/2019),  (03/17/2019),  (03/31/2019),  (04/14/2019),  (05/12/2019),  (05/30/2019),  (06/14/2019),  (06/28/2019),  (07/13/2019)  for chemotherapy treatment.       CANCER STAGING: Cancer Staging Pancreatic cancer metastasized to liver [facility]) Staging form: Pancreas, AJCC 7th Edition - Clinical: Stage IV (T3, N1, M1) - Signed by Baird Cancer, PA-C on 03/16/2014 - Pathologic: No stage assigned - Unsigned    INTERVAL HISTORY:  Ms. Villafuerte 86 y.o. female seen for follow-up and toxicity assessment prior to next treatment for metastatic pancreatic cancer.  Appetite and energy levels are 50%.  Lost about 4 pounds in the last 2 weeks.  No pain reported.  Continues to have urinary incontinence.  She did not receive her Ditropan XL yet.  Has some constipation which is controlled with stool softener.  REVIEW OF SYSTEMS:  Review of Systems  Gastrointestinal: Positive for constipation.  Genitourinary: Positive for bladder incontinence and frequency.   All other systems reviewed and are negative.    PAST MEDICAL/SURGICAL HISTORY:  Past Medical History:  Diagnosis Date  . Anemia due to antineoplastic chemotherapy 09/12/2015   Started Aranesp 500 mcg on 09/12/2015  . Anxiety   . Chronic diarrhea   . Depression   . DNR (do not resuscitate) 08/16/2015  . Erosive esophagitis   . Family history of breast cancer in female   . Family history of prostate cancer   . GERD (gastroesophageal reflux disease)   . Hx of adenomatous colonic polyps    tubular adenomas, last found in 2008  . Hyperplastic colon polyp 03/19/10   tcs by Dr. Gala Romney  . Hypertension 20 years   . Kidney stone    hx/ crushed   .  Opioid contract exists 04/18/2015   With Dr. Moshe Cipro  . Pancreatic cancer ([place]) 02/2014  . Pancreatic cancer metastasized to liver ([place]) 03/10/2014  . Schatzki's ring 08/26/10   Last dilated on EGD by Dr. Trevor Iha [facility], linear gastric erosions, BI hemigastrectomy   Past Surgical History:  Procedure Laterality Date  . BREAST LUMPECTOMY Left   . bunion removal     from both feet   . CHOLECYSTECTOMY  1965   . COLONOSCOPY  03/19/2010   DR Gala Romney,, normal TI, pancolonic diverticula, random colon bx neg., hyperplastic polyps removed  . ESOPHAGOGASTRODUODENOSCOPY  11/22/2003   DR Gala Romney, erosive RE, Billroth I  . ESOPHAGOGASTRODUODENOSCOPY  08/26/10   Dr. Gala Romney- moderate severe ERE, Scahtzki ring s/p dilation, Billroth I, linear gastric erosions, bx-gastric xanthelasma  . LEFT SHOULDER SURGERY  2009   DR HARRISON  . ORIF ANKLE FRACTURE Right 05/22/2013   Procedure: OPEN REDUCTION INTERNAL FIXATION (ORIF) RIGHT ANKLE FRACTURE;  Surgeon: Sanjuana Kava, MD;  Location: [facility];  Service: Orthopedics;  Laterality: Right;  . stomach ulcer  50 years ago    had some of her stomach removed      SOCIAL HISTORY:  Social History   Socioeconomic History  . Marital status:  Widowed    Spouse name: Not on file  . Number of children: 2  . Years of education: 26  . Highest education level: 12th grade  Occupational History  . Occupation: retired from Estate manager/land agent: RETIRED  Tobacco Use  . Smoking status: Current Some Day Smoker    Packs/day: 0.50    Years: 60.00    Pack years: 30.00    Types: Cigarettes    Last attempt to quit: 09/09/2016    Years since quitting: 2.8  . Smokeless tobacco: Never Used  Substance and Sexual Activity  . Alcohol use: No  . Drug use: No  . Sexual activity: Not Currently  Other Topics Concern  . Not on file  Social History Narrative   Lives with son alone    Social Determinants of Health   Financial Resource Strain:   . Difficulty of Paying Living Expenses:     Food Insecurity:   . Worried About Charity fundraiser in the Last Year:   . Arboriculturist in the Last Year:   Transportation Needs:   . Film/video editor (Medical):   Marland Kitchen Lack of Transportation (Non-Medical):   Physical Activity:   . Days of Exercise per Week:   . Minutes of Exercise per Session:   Stress:   . Feeling of Stress :   Social Connections:   . Frequency of Communication with Friends and Family:   . Frequency of Social Gatherings with Friends and Family:   . Attends Religious Services:   . Active Member of Clubs or Organizations:   . Attends Archivist Meetings:   Marland Kitchen Marital Status:   Intimate Partner Violence:   . Fear of Current or Ex-Partner:   . Emotionally Abused:   Marland Kitchen Physically Abused:   . Sexually Abused:     FAMILY HISTORY:  Family History  Problem Relation Age of Onset  . Hypertension Mother   . Kidney failure Brother        on dialysis  . Diabetes Sister   . Diabetes Sister   . Hypertension Father   . Kidney failure Sister   . Kidney Stones Brother   . Breast cancer Son 26  . Gout Son 29  . Gout Nephew 57  . Liver disease Neg Hx   . Colon cancer Neg Hx     CURRENT MEDICATIONS:  Outpatient Encounter Medications as of 07/27/2019  Medication Sig  . acetaminophen (TYLENOL) 500 MG tablet Take 500 mg by mouth every 6 (six) hours as needed for mild pain or moderate pain.   Marland Kitchen amLODipine (NORVASC) 10 MG tablet TAKE 1 TABLET DAILY  . CISPLATIN IV Inject into the vein. Days 1, 8 every 21 days  . clobetasol (TEMOVATE) 0.05 % external solution APP AA ON SCALP BID PRN NOT TO FACE GROIN OR UNDERARMS  . clonazePAM (KLONOPIN) 0.5 MG tablet Take 1 tablet (0.5 mg total) by mouth at bedtime.  . cycloSPORINE (RESTASIS) 0.05 % ophthalmic emulsion Place 1 drop into both eyes 2 (two) times daily.  . ergocalciferol (VITAMIN D2) 1.25 MG (50000 UT) capsule Take 1 capsule (50,000 Units total) by mouth once a week.  . fluorometholone (FML) 0.1 % ophthalmic  suspension INSTILL 1 DROP INTO EACH EYE THREE TIMES DAILY FOR 14 DAYS  . fluticasone (CUTIVATE) 0.05 % cream APPLY TO THE AFFECTED AREA FACE TWICE DAILY AS NEEDED  . fluticasone (FLONASE) 50 MCG/ACT nasal spray Place 1 spray into both nostrils daily.  Marland Kitchen  GEMCITABINE HCL IV Inject into the vein. Days 1, 8 every 21 days  . lidocaine (XYLOCAINE) 2 % solution RINSE WITH 5ML AS NEEDED TO RELIEVE MOUTH PAIN.  Marland Kitchen lidocaine-prilocaine (EMLA) cream Apply 1 application topically as needed. Apply to portacath site as needed  . loperamide (IMODIUM A-D) 2 MG tablet Take 2 mg by mouth 4 (four) times daily as needed for diarrhea or loose stools.  . metoprolol tartrate (LOPRESSOR) 25 MG tablet TAKE 1 TABLET DAILY FOR BLOOD PRESSURE (DOSE REDUCTION)  . mirtazapine (REMERON) 15 MG tablet Take 15 mg by mouth at bedtime.  Marland Kitchen oxybutynin (DITROPAN XL) 5 MG 24 hr tablet Take 1 tablet (5 mg total) by mouth at bedtime.  . pantoprazole (PROTONIX) 20 MG tablet TAKE 1 TABLET DAILY  . senna-docusate (SENOKOT-S) 8.6-50 MG tablet Take 1 tablet by mouth daily.  . [DISCONTINUED] prochlorperazine (COMPAZINE) 10 MG tablet Take 1 tablet (10 mg total) by mouth every 6 (six) hours as needed (Nausea or vomiting).   No facility-administered encounter medications on file as of 07/27/2019.    ALLERGIES:  Allergies  Allergen Reactions  . Iohexol Hives and Shortness Of Breath    **Patient can not have IV contrast, pt has breakthrough reaction even after 13 hour premeds** Do not give IV contrast PATIENT HAD TO BE TAKEN TO ED, C/O WHELPS, HIVES, DIFFICULTY BREATHING. PATIENT GIVEN IV CONTRAST AFTER 13 HOUR PRE MEDS ON 01/09/2017 WITH NO REACTION NOTED. On 10/19/17 patient had reaction despite premedication.    Marland Kitchen Aciphex [Rabeprazole Sodium] Other (See Comments)    unknown  . Amlodipine Besylate-Valsartan     Rash to high-dose (5/320)  . Esomeprazole Magnesium   . Omeprazole Other (See Comments)    Patient states "medication didn't work"    . Ciprofloxacin Rash  . Penicillins Swelling and Rash    Has patient had a PCN reaction causing immediate rash, facial/tongue/throat swelling, SOB or lightheadedness with hypotension: Yes Has patient had a PCN reaction causing severe rash involving mucus membranes or skin necrosis: Yes Has patient had a PCN reaction that required hospitalization: No Has patient had a PCN reaction occurring within the last 10 years: yes If all of the above answers are "NO", then may proceed with Cephalosporin use.   [name] [Benzonatate] Itching    Full head, face, neck, and chest itching.      PHYSICAL EXAM:  ECOG Performance status: 1  There were no vitals filed for this visit. There were no vitals filed for this visit.  Physical Exam Vitals reviewed.  Constitutional:      Appearance: Normal appearance.  Cardiovascular:     Rate and Rhythm: Normal rate and regular rhythm.     Heart sounds: Normal heart sounds.  Pulmonary:     Effort: Pulmonary effort is normal.     Breath sounds: Normal breath sounds.  Abdominal:     General: There is no distension.     Palpations: Abdomen is soft. There is no mass.  Musculoskeletal:        General: No swelling.  Skin:    General: Skin is warm.  Neurological:     General: No focal deficit present.     Mental Status: She is alert and oriented to person, place, and time.  Psychiatric:        Mood and Affect: Mood normal.        Behavior: Behavior normal.      LABORATORY DATA:  I have reviewed the labs as listed.  CBC    Component Value Date/Time   WBC 12.7 (H) 07/27/2019 0752   RBC 2.56 (L) 07/27/2019 0752   HGB 7.9 (L) 07/27/2019 0752   HCT 24.5 (L) 07/27/2019 0752   PLT 97 (L) 07/27/2019 0752   MCV 95.7 07/27/2019 0752   MCH 30.9 07/27/2019 0752   MCHC 32.2 07/27/2019 0752   RDW 18.1 (H) 07/27/2019 0752   LYMPHSABS 1.4 07/27/2019 0752   MONOABS 0.7 07/27/2019 0752   EOSABS 0.2 07/27/2019 0752   BASOSABS 0.0 07/27/2019 0752    CMP Latest Ref Rng & Units 07/27/2019 07/13/2019 06/28/2019  Glucose 70 - 99 mg/dL 106(H) 102(H) 92  BUN 8 - 23 mg/dL 25(H) 22 23  Creatinine 0.44 - 1.00 mg/dL 1.53(H) 1.31(H) 1.18(H)  Sodium 135 - 145 mmol/L 139 140 140  Potassium 3.5 - 5.1 mmol/L 4.1 3.8 3.8  Chloride 98 - 111 mmol/L 106 108 106  CO2 22 - 32 mmol/L '24 23 25  '$ Calcium 8.9 - 10.3 mg/dL 8.0(L) 8.0(L) 7.9(L)  Total Protein 6.5 - 8.1 g/dL 7.2 7.1 6.8  Total Bilirubin 0.3 - 1.2 mg/dL 0.3 0.5 0.7  Alkaline Phos 38 - 126 U/L 226(H) 213(H) 191(H)  AST 15 - 41 U/L '17 19 19  '$ ALT 0 - 44 U/L '14 13 14       '$ DIAGNOSTIC IMAGING:  I have reviewed her scans.   I have reviewed Venita Lick LPN's note and agree with the documentation.  I personally performed a face-to-face visit, made revisions and my assessment and plan is as follows.    ASSESSMENT & PLAN:   Pancreatic cancer metastasized to liver ([place]) 1.  Metastatic pancreatic cancer to the liver: -6 cycles of gemcitabine and cisplatin day 1 and day 15 from 02/10/2019 through 07/13/2019. -CT CAP on 06/09/2019 showed right hepatic lesion measuring 4 x 3.2 cm, previously 3.9 x 3.6 cm.  Segment 2 liver lesion measures 2.4 x 1.9 cm, left and right omental nodules increased in size by 5 mm.  However study is limited by lack of IV contrast. -She lost 4 pounds in the last 2 weeks.  Denies any abdominal pains. -I reviewed her labs.  Creatinine has gone up to 1.53.  White count is 12.7.  Hemoglobin is 7.9.  Platelet count is 97. -I would hold her treatment today.  We will give hydration and reassess her in 1 week for treatment.  2.  Weight loss: -She lost 4 pounds in the last 2 weeks.  Overall she lost about 2 pounds from her baseline. -We will closely monitor her weight.  3.  Genetic testing: -Germline mutation testing was negative on 12/24/2018.  4.  Renal insufficiency: -Creatinine today has gone up to 1.53. -This is from cisplatin.  We will give her 1 L of fluids today.  We will  hold her cisplatin today.  5.  Vitamin D deficiency: -She will continue vitamin D 50,000 units daily.  6.  Normocytic anemia: -This is from combination of CKD and chemotherapy.  Hemoglobin today is 7.9. -She is not symptomatic.  Hence no transfusion indicated.  7.  Overactive bladder: -She took Myrbetriq without help. -I have given her Ditropan XL 5 mg prescription.  She has difficulty obtaining the medicine.  She will likely get the medicine this week and start taking it.  I plan to titrate up as needed.     Orders placed this encounter:  No orders of the defined types were placed in this encounter.    Dirk Dress  Delton Coombes, [place] [phone]

## 2019-07-27 NOTE — Patient Instructions (Signed)
[facility] at [facility] Discharge Instructions  Labs drawn from portacath today   Thank you for choosing  [facility] at [facility] to provide your oncology and hematology care.  To afford each patient quality time with our provider, please arrive at least 15 minutes before your scheduled appointment time.   If you have a lab appointment with the Cancer Center please come in thru the Main Entrance and check in at the main information desk.  You need to re-schedule your appointment should you arrive 10 or more minutes late.  We strive to give you quality time with our providers, and arriving late affects you and other patients whose appointments are after yours.  Also, if you no show three or more times for appointments you may be dismissed from the clinic at the providers discretion.     Again, thank you for choosing [facility].  Our hope is that these requests will decrease the amount of time that you wait before being seen by our physicians.       _____________________________________________________________  Should you have questions after your visit to [facility], please contact our office at [phone] between the hours of 8:00 a.m. and 4:30 p.m.  Voicemails left after 4:00 p.m. will not be returned until the following business day.  For prescription refill requests, have your pharmacy contact our office and allow 72 hours.    Due to Covid, you will need to wear a mask upon entering the hospital. If you do not have a mask, a mask will be given to you at the Main Entrance upon arrival. For doctor visits, patients may have 1 support person with them. For treatment visits, patients can not have anyone with them due to social distancing guidelines and our immunocompromised population.     

## 2019-07-27 NOTE — Progress Notes (Signed)
Patient has been assessed, vital signs and labs have been reviewed by Dr. Delton Coombes. HOLD Tx today. Please give 1 liter of fluids with electrolytes over 2 hours today.

## 2019-07-27 NOTE — Patient Instructions (Signed)
[name] at [facility] Discharge Instructions  Received IV hydration with magnesium and potassium today. Chemo tx held. Follow-up as scheduled. Call clinic for any questions or concerns   Thank you for choosing [place] at [facility] to provide your oncology and hematology care.  To afford each patient quality time with our provider, please arrive at least 15 minutes before your scheduled appointment time.   If you have a lab appointment with the [facility] please come in thru the Main Entrance and check in at the main information desk.  You need to re-schedule your appointment should you arrive 10 or more minutes late.  We strive to give you quality time with our providers, and arriving late affects you and other patients whose appointments are after yours.  Also, if you no show three or more times for appointments you may be dismissed from the clinic at the providers discretion.     Again, thank you for choosing [facility].  Our hope is that these requests will decrease the amount of time that you wait before being seen by our physicians.       _____________________________________________________________  Should you have questions after your visit to [facility], please contact our office at [phone] between the hours of 8:00 a.m. and 4:30 p.m.  Voicemails left after 4:00 p.m. will not be returned until the following business day.  For prescription refill requests, have your pharmacy contact our office and allow 72 hours.    Due to Covid, you will need to wear a mask upon entering the hospital. If you do not have a mask, a mask will be given to you at the Main Entrance upon arrival. For doctor visits, patients may have 1 support person with them. For treatment visits, patients can not have anyone with them due to social distancing guidelines and our immunocompromised population.

## 2019-07-30 NOTE — Assessment & Plan Note (Addendum)
1.  Metastatic pancreatic cancer to the liver: -6 cycles of gemcitabine and cisplatin day 1 and day 15 from 02/10/2019 through 07/13/2019. -CT CAP on 06/09/2019 showed right hepatic lesion measuring 4 x 3.2 cm, previously 3.9 x 3.6 cm.  Segment 2 liver lesion measures 2.4 x 1.9 cm, left and right omental nodules increased in size by 5 mm.  However study is limited by lack of IV contrast. -She lost 4 pounds in the last 2 weeks.  Denies any abdominal pains. -I reviewed her labs.  Creatinine has gone up to 1.53.  White count is 12.7.  Hemoglobin is 7.9.  Platelet count is 97. -I would hold her treatment today.  We will give hydration and reassess her in 1 week for treatment.  2.  Weight loss: -She lost 4 pounds in the last 2 weeks.  Overall she lost about 2 pounds from her baseline. -We will closely monitor her weight.  3.  Genetic testing: -Germline mutation testing was negative on 12/24/2018.  4.  Renal insufficiency: -Creatinine today has gone up to 1.53. -This is from cisplatin.  We will give her 1 L of fluids today.  We will hold her cisplatin today.  5.  Vitamin D deficiency: -She will continue vitamin D 50,000 units daily.  6.  Normocytic anemia: -This is from combination of CKD and chemotherapy.  Hemoglobin today is 7.9. -She is not symptomatic.  Hence no transfusion indicated.  7.  Overactive bladder: -She took Myrbetriq without help. -I have given her Ditropan XL 5 mg prescription.  She has difficulty obtaining the medicine.  She will likely get the medicine this week and start taking it.  I plan to titrate up as needed.

## 2019-08-02 NOTE — Progress Notes (Signed)
.  Pharmacist Chemotherapy Monitoring - Follow Up Assessment    I verify that I have reviewed each item in the below checklist:  . Regimen for the patient is scheduled for the appropriate day and plan matches scheduled date. Marland Kitchen Appropriate non-routine labs are ordered dependent on drug ordered. . If applicable, additional medications reviewed and ordered per protocol based on lifetime cumulative doses and/or treatment regimen.   Plan for follow-up and/or issues identified: Yes . I-vent associated with next due treatment: No . MD and/or nursing notified: No  . Needs injection appt if is treated.  Will notify when treatment started.  Wynona Neat 08/02/2019 12:35 PM

## 2019-08-04 ENCOUNTER — Inpatient Hospital Stay (HOSPITAL_COMMUNITY): Payer: Medicare HMO

## 2019-08-04 ENCOUNTER — Other Ambulatory Visit: Payer: Self-pay

## 2019-08-04 ENCOUNTER — Inpatient Hospital Stay (HOSPITAL_BASED_OUTPATIENT_CLINIC_OR_DEPARTMENT_OTHER): Payer: Medicare HMO | Admitting: Hematology

## 2019-08-04 ENCOUNTER — Encounter (HOSPITAL_COMMUNITY): Payer: Self-pay | Admitting: Hematology

## 2019-08-04 VITALS — BP 159/57 | HR 56 | Temp 96.9°F | Resp 18 | Wt 110.9 lb

## 2019-08-04 VITALS — BP 145/62 | HR 67 | Temp 96.8°F | Resp 18

## 2019-08-04 DIAGNOSIS — Z5111 Encounter for antineoplastic chemotherapy: Secondary | ICD-10-CM | POA: Diagnosis not present

## 2019-08-04 DIAGNOSIS — C787 Secondary malignant neoplasm of liver and intrahepatic bile duct: Secondary | ICD-10-CM

## 2019-08-04 DIAGNOSIS — C259 Malignant neoplasm of pancreas, unspecified: Secondary | ICD-10-CM

## 2019-08-04 DIAGNOSIS — R7989 Other specified abnormal findings of blood chemistry: Secondary | ICD-10-CM | POA: Diagnosis not present

## 2019-08-04 LAB — MAGNESIUM: Magnesium: 1.7 mg/dL (ref 1.7–2.4)

## 2019-08-04 LAB — COMPREHENSIVE METABOLIC PANEL
ALT: 14 U/L (ref 0–44)
AST: 24 U/L (ref 15–41)
Albumin: 3.4 g/dL — ABNORMAL LOW (ref 3.5–5.0)
Alkaline Phosphatase: 209 U/L — ABNORMAL HIGH (ref 38–126)
Anion gap: 9 (ref 5–15)
BUN: 23 mg/dL (ref 8–23)
CO2: 24 mmol/L (ref 22–32)
Calcium: 8 mg/dL — ABNORMAL LOW (ref 8.9–10.3)
Chloride: 105 mmol/L (ref 98–111)
Creatinine, Ser: 1.38 mg/dL — ABNORMAL HIGH (ref 0.44–1.00)
GFR calc Af Amer: 39 mL/min — ABNORMAL LOW (ref >60)
GFR calc non Af Amer: 34 mL/min — ABNORMAL LOW (ref >60)
Glucose, Bld: 112 mg/dL — ABNORMAL HIGH (ref 70–99)
Potassium: 4 mmol/L (ref 3.5–5.1)
Sodium: 138 mmol/L (ref 135–145)
Total Bilirubin: 0.4 mg/dL (ref 0.3–1.2)
Total Protein: 7 g/dL (ref 6.5–8.1)

## 2019-08-04 LAB — CBC WITH DIFFERENTIAL/PLATELET
Abs Immature Granulocytes: 0.05 10*3/uL (ref 0.00–0.07)
Basophils Absolute: 0 10*3/uL (ref 0.0–0.1)
Basophils Relative: 0 %
Eosinophils Absolute: 0.2 10*3/uL (ref 0.0–0.5)
Eosinophils Relative: 3 %
HCT: 25.4 % — ABNORMAL LOW (ref 36.0–46.0)
Hemoglobin: 8.1 g/dL — ABNORMAL LOW (ref 12.0–15.0)
Immature Granulocytes: 1 %
Lymphocytes Relative: 15 %
Lymphs Abs: 1.1 10*3/uL (ref 0.7–4.0)
MCH: 30.5 pg (ref 26.0–34.0)
MCHC: 31.9 g/dL (ref 30.0–36.0)
MCV: 95.5 fL (ref 80.0–100.0)
Monocytes Absolute: 0.5 10*3/uL (ref 0.1–1.0)
Monocytes Relative: 7 %
Neutro Abs: 5.5 10*3/uL (ref 1.7–7.7)
Neutrophils Relative %: 74 %
Platelets: 191 10*3/uL (ref 150–400)
RBC: 2.66 MIL/uL — ABNORMAL LOW (ref 3.87–5.11)
RDW: 18.1 % — ABNORMAL HIGH (ref 11.5–15.5)
WBC: 7.3 10*3/uL (ref 4.0–10.5)
nRBC: 0 % (ref 0.0–0.2)

## 2019-08-04 LAB — VITAMIN D 25 HYDROXY (VIT D DEFICIENCY, FRACTURES): Vit D, 25-Hydroxy: 9.96 ng/mL — ABNORMAL LOW (ref 30–100)

## 2019-08-04 MED ORDER — SODIUM CHLORIDE 0.9 % IV SOLN
10.0000 mg | Freq: Once | INTRAVENOUS | Status: AC
  Administered 2019-08-04: 10 mg via INTRAVENOUS
  Filled 2019-08-04: qty 10

## 2019-08-04 MED ORDER — ERGOCALCIFEROL 1.25 MG (50000 UT) PO CAPS: 50000.0000 [IU] | ORAL_CAPSULE | ORAL | 2 refills | Status: DC

## 2019-08-04 MED ORDER — SODIUM CHLORIDE 0.9 % IV SOLN: Freq: Once | INTRAVENOUS | Status: AC

## 2019-08-04 MED ORDER — POTASSIUM CHLORIDE 2 MEQ/ML IV SOLN
Freq: Once | INTRAVENOUS | Status: AC
  Filled 2019-08-04: qty 10

## 2019-08-04 MED ORDER — SODIUM CHLORIDE 0.9% FLUSH
10.0000 mL | INTRAVENOUS | Status: DC | PRN
  Administered 2019-08-04: 10 mL

## 2019-08-04 MED ORDER — PALONOSETRON HCL INJECTION 0.25 MG/5ML
0.2500 mg | Freq: Once | INTRAVENOUS | Status: AC
  Administered 2019-08-04: 0.25 mg via INTRAVENOUS
  Filled 2019-08-04: qty 5

## 2019-08-04 MED ORDER — SODIUM CHLORIDE 0.9 % IV SOLN
Freq: Once | INTRAVENOUS | Status: AC
  Filled 2019-08-04: qty 150

## 2019-08-04 MED ORDER — HEPARIN SOD (PORK) LOCK FLUSH 100 UNIT/ML IV SOLN
500.0000 [IU] | Freq: Once | INTRAVENOUS | Status: AC | PRN
  Administered 2019-08-04: 500 [IU]

## 2019-08-04 MED ORDER — SODIUM CHLORIDE 0.9 % IV SOLN
750.0000 mg/m2 | Freq: Once | INTRAVENOUS | Status: AC
  Administered 2019-08-04: 1140 mg via INTRAVENOUS
  Filled 2019-08-04: qty 26.3

## 2019-08-04 MED ORDER — SODIUM CHLORIDE 0.9 % IV SOLN
20.0000 mg/m2 | Freq: Once | INTRAVENOUS | Status: AC
  Administered 2019-08-04: 31 mg via INTRAVENOUS
  Filled 2019-08-04: qty 31

## 2019-08-04 NOTE — Patient Instructions (Signed)
[place] at [facility] Discharge Instructions  You were seen today by Dr. Delton Coombes. He went over your recent lab results. Fluids tomorrow. He will see you back in 2 weeks for labs, treatment and follow up.   Thank you for choosing [place] at [facility] to provide your oncology and hematology care.  To afford each patient quality time with our provider, please arrive at least 15 minutes before your scheduled appointment time.   If you have a lab appointment with the [facility] please come in thru the  Main Entrance and check in at the main information desk  You need to re-schedule your appointment should you arrive 10 or more minutes late.  We strive to give you quality time with our providers, and arriving late affects you and other patients whose appointments are after yours.  Also, if you no show three or more times for appointments you may be dismissed from the clinic at the providers discretion.     Again, thank you for choosing [facility].  Our hope is that these requests will decrease the amount of time that you wait before being seen by our physicians.       _____________________________________________________________  Should you have questions after your visit to [facility], please contact our office at [phone] between the hours of 8:00 a.m. and 4:30 p.m.  Voicemails left after 4:00 p.m. will not be returned until the following business day.  For prescription refill requests, have your pharmacy contact our office and allow 72 hours.    Cancer Center Support Programs:   > Cancer Support Group  2nd Tuesday of the month 1pm-2pm, Journey Room

## 2019-08-04 NOTE — Progress Notes (Signed)
[place], [place] [address]   CLINIC:  Medical Oncology/Hematology  PCP:  Fayrene Helper, MD [address], [place] [address] [phone]   REASON FOR VISIT:  Follow-up for metastatic pancreatic cancer to the liver   BRIEF ONCOLOGIC HISTORY:  Oncology History  Pancreatic cancer metastasized to liver ([place])  03/10/2014 Initial Diagnosis   Pancreatic cancer metastasized to liver   03/22/2014 - 03/05/2016 Chemotherapy   Abraxane/Gemzar days 1, 8, every 28 days.  Day 15 was cancelled due to leukopenia and thrombocytopenia on day 15 cycle 1.   05/24/2014 Treatment Plan Change   Day 8 of cycle 3 is held with ANC of 1.1   06/05/2014 Imaging   CT C/A/P . Interval decrease in size of the pancreatic tail mass.Improved hepatic metastatic disease. No new lesions. No CT findings for metastatic disease involving the chest.   08/09/2014 Tumor Marker   CA 19-9= 33 (WNL)   10/26/2014 Imaging   MRI- Continued interval decrease in size of the hepatic metastatic lesions and no new lesions are identified. Continued decrease in size of the pancreatic tail lesion.   01/03/2015 Tumor Marker   Results for [name], [name] (MRN [id number]) as of 01/18/2015 12:52  01/03/2015 10:00 CA 19-9: 14    01/17/2015 Imaging   MRI- Response to therapy of hepatic metastasis.  Similar size of a pancreatic tail lesion.   03/20/2015 Imaging   MRI-L spine- Severe disc space narrowing at L2-L3, with endplate reactive changes. Large disc extrusion into the ventral epidural space,central to the RIGHT with a cephalad migrated free fragment. Additional Large disc extrusion into the retroperitone...   03/22/2015 Imaging   CT pelvis- No evidence for metastatic disease within the pelvis.   07/24/2015 Imaging   MRI abd- Continued response to therapy, with no residual detectable liver metastases. No new sites of metastatic disease in the abdomen.    08/15/2015 Code Status     She confirms desire for DNR status.   12/21/2015 Imaging   MRI liver- Severe image degradation due to motion artifact reducing diagnostic sensitivity and specificity. Reduce conspicuity of the pancreatic tail lesion suggesting further improvement. The original liver lesions have resolved.   03/05/2016 Treatment Plan Change   Chemotherapy holiday/Break after 2 years worth of treatment   04/07/2016 Imaging   CT chest- When compared to recent chest CT, new minimally displaced anterior left sixth rib fracture. Slight increase in subcarinal adenopathy   04/28/2016 Imaging   Bone density- BMD as determined from Femur Neck Right is 0.705 g/cm2 with a T-Score of -2.4. This patient is considered osteopenic according to [facility]) criteria. Compared with the prior study on 02/23/2013, the BMD of the lumbar spine/rt. femoral neck show a statistically significant decrease.    05/23/2016 Imaging   MRI abd- 1. Exam is significantly degraded by patient respiratory motion. Consider follow-up exams with CT abdomen with without contrast per pancreatic protocol. 2. Fullness in tail of pancreas is more prominent and potentially increased in size. Recommend close attention on follow-up. (Consider CT as above) 3. No explanation for back pain.   09/11/2016 Imaging   MRI pancreas- Interval progression of pancreatic tail lesion. New differential perfusion right liver with areas of heterogeneity. Underlying metastatic disease in this region not excluded.   09/11/2016 Progression   MRi in conjunction with rising CA 19-9 are indicative of relapse of disease.   01/09/2017 Progression   CT C/A/P: 2.1 x 2.9  cm lesion in the pancreatic tail and abutting the splenic hilum, corresponding to known primary pancreatic neoplasm, mildly increased.  Scattered small hypoenhancing lesions in the liver measuring up to 10 mm, approximately 8-10 in number, suspicious for hepatic metastases.  No  findings specific for metastatic disease in the chest.  Additional ancillary findings as above.    01/27/2017 - 10/20/2017 Chemotherapy   The patient had gemcitabine (GEMZAR) 1,292 mg in sodium chloride 0.9 % 250 mL chemo infusion, 800 mg/m2 = 1,292 mg, Intravenous,  Once, 9 of 12 cycles Administration: 1,292 mg (01/28/2017), 1,292 mg (02/11/2017), 1,292 mg (02/25/2017), 1,292 mg (03/11/2017), 1,292 mg (03/25/2017), 1,292 mg (04/08/2017), 1,292 mg (04/22/2017), 1,292 mg (04/29/2017), 1,292 mg (05/20/2017), 1,292 mg (06/03/2017), 1,292 mg (06/24/2017), 1,292 mg (07/08/2017), 1,292 mg (07/29/2017), 1,292 mg (08/12/2017), 1,292 mg (08/26/2017), 1,292 mg (09/09/2017), 1,292 mg (09/23/2017), 1,292 mg (10/07/2017)  for chemotherapy treatment.    10/21/2017 - 09/22/2018 Chemotherapy   The patient had leucovorin 700 mg in dextrose 5 % 250 mL infusion, 644 mg, Intravenous,  Once, 22 of 22 cycles Administration: 700 mg (10/21/2017), 700 mg (11/04/2017), 700 mg (11/18/2017), 700 mg (12/02/2017), 700 mg (12/16/2017), 700 mg (12/30/2017), 700 mg (01/13/2018), 700 mg (01/27/2018), 700 mg (02/10/2018), 700 mg (02/24/2018), 700 mg (03/10/2018), 700 mg (03/24/2018), 700 mg (04/28/2018), 700 mg (05/17/2018), 700 mg (05/31/2018), 700 mg (06/16/2018), 700 mg (06/30/2018), 700 mg (07/21/2018), 700 mg (08/04/2018), 700 mg (08/25/2018), 700 mg (09/08/2018) ondansetron (ZOFRAN) 8 mg in sodium chloride 0.9 % 50 mL IVPB, , Intravenous,  Once, 21 of 21 cycles Administration:  (11/04/2017),  (01/27/2018),  (02/10/2018),  (02/24/2018),  (03/10/2018),  (03/24/2018),  (04/28/2018),  (05/17/2018),  (05/31/2018),  (06/16/2018),  (06/30/2018),  (07/21/2018),  (08/04/2018),  (08/25/2018),  (09/08/2018) fluorouracil (ADRUCIL) 3,850 mg in sodium chloride 0.9 % 73 mL chemo infusion, 2,400 mg/m2 = 3,850 mg, Intravenous, 1 Day/Dose, 22 of 22 cycles Dose modification: 1,920 mg/m2 (original dose 2,400 mg/m2, Cycle 2, Reason: Provider Judgment, Comment: mucisitis) Administration: 3,850 mg  (10/21/2017), 3,100 mg (11/04/2017), 3,100 mg (11/18/2017), 3,100 mg (12/02/2017), 3,100 mg (12/16/2017), 3,100 mg (12/30/2017), 3,100 mg (01/13/2018), 3,100 mg (01/27/2018), 3,100 mg (02/10/2018), 3,100 mg (02/24/2018), 3,100 mg (03/10/2018), 3,100 mg (03/24/2018), 3,100 mg (04/28/2018), 3,100 mg (05/17/2018), 3,100 mg (05/31/2018), 3,100 mg (06/16/2018), 3,100 mg (06/30/2018), 3,100 mg (07/21/2018), 3,100 mg (08/04/2018), 3,100 mg (08/25/2018), 3,100 mg (09/08/2018) irinotecan LIPOSOME (ONIVYDE) 81.7 mg in sodium chloride 0.9 % 500 mL chemo infusion, 50 mg/m2 = 81.7 mg (100 % of original dose 50 mg/m2), Intravenous, Once, 22 of 22 cycles Dose modification: 50 mg/m2 (original dose 50 mg/m2, Cycle 1, Reason: Provider Judgment), 50 mg/m2 (original dose 50 mg/m2, Cycle 2, Reason: Provider Judgment) Administration: 81.7 mg (10/21/2017), 81.7 mg (11/04/2017), 81.7 mg (11/18/2017), 81.7 mg (12/02/2017), 81.7 mg (12/16/2017), 81.7 mg (12/30/2017), 81.7 mg (01/13/2018), 81.7 mg (01/27/2018), 81.7 mg (02/10/2018), 81.7 mg (02/24/2018), 81.7 mg (03/10/2018), 81.7 mg (03/24/2018), 81.7 mg (04/28/2018), 81.7 mg (05/17/2018), 81.7 mg (05/31/2018), 81.7 mg (06/16/2018), 81.7 mg (06/30/2018), 81.7 mg (07/21/2018), 81.7 mg (08/04/2018), 81.7 mg (08/25/2018), 81.7 mg (09/08/2018)  for chemotherapy treatment.    09/22/2018 - 01/11/2019 Chemotherapy   The patient had palonosetron (ALOXI) injection 0.25 mg, 0.25 mg, Intravenous,  Once, 7 of 8 cycles Administration: 0.25 mg (09/22/2018), 0.25 mg (10/11/2018), 0.25 mg (10/25/2018), 0.25 mg (12/01/2018), 0.25 mg (12/15/2018), 0.25 mg (11/17/2018), 0.25 mg (12/29/2018) pegfilgrastim (NEULASTA) injection 6 mg, 6 mg, Subcutaneous, Once, 5 of 5 cycles Administration: 6 mg (10/13/2018), 6 mg (10/27/2018), 6 mg (12/03/2018), 6  mg (12/17/2018), 6 mg (11/19/2018) pegfilgrastim-cbqv (UDENYCA) injection 6 mg, 6 mg, Subcutaneous, Once, 1 of 2 cycles Administration: 6 mg (12/31/2018) leucovorin 628 mg in dextrose 5 % 250 mL infusion, 400 mg/m2 =  628 mg, Intravenous,  Once, 7 of 8 cycles Administration: 628 mg (09/22/2018), 628 mg (10/11/2018), 628 mg (10/25/2018), 628 mg (12/01/2018), 628 mg (12/15/2018), 628 mg (11/17/2018), 628 mg (12/29/2018) oxaliplatin (ELOXATIN) 100 mg in dextrose 5 % 500 mL chemo infusion, 65 mg/m2 = 100 mg (100 % of original dose 65 mg/m2), Intravenous,  Once, 7 of 8 cycles Dose modification: 65 mg/m2 (original dose 65 mg/m2, Cycle 1, Reason: Provider Judgment) Administration: 100 mg (09/22/2018), 100 mg (10/11/2018), 100 mg (10/25/2018), 100 mg (12/01/2018), 100 mg (12/15/2018), 100 mg (11/17/2018), 100 mg (12/29/2018) fluorouracil (ADRUCIL) chemo injection 500 mg, 320 mg/m2 = 500 mg (100 % of original dose 320 mg/m2), Intravenous,  Once, 7 of 8 cycles Dose modification: 320 mg/m2 (original dose 320 mg/m2, Cycle 1, Reason: Provider Judgment) Administration: 500 mg (09/22/2018), 500 mg (10/11/2018), 500 mg (10/25/2018), 500 mg (12/01/2018), 500 mg (12/15/2018), 500 mg (11/17/2018), 500 mg (12/29/2018) fluorouracil (ADRUCIL) 3,000 mg in sodium chloride 0.9 % 90 mL chemo infusion, 1,920 mg/m2 = 3,000 mg (100 % of original dose 1,920 mg/m2), Intravenous, 1 Day/Dose, 7 of 8 cycles Dose modification: 1,920 mg/m2 (original dose 1,920 mg/m2, Cycle 1, Reason: Provider Judgment) Administration: 3,000 mg (09/22/2018), 3,000 mg (10/11/2018), 3,000 mg (10/25/2018), 3,000 mg (12/01/2018), 3,000 mg (12/15/2018), 3,000 mg (11/17/2018), 3,000 mg (12/29/2018)  for chemotherapy treatment.    12/24/2018 Genetic Testing   Negative genetic testing on the common hereditary cancer panel.  The Common Hereditary Gene Panel offered by [facility] includes sequencing and/or deletion duplication testing of the following 48 genes: APC, ATM, AXIN2, BARD1, BMPR1A, BRCA1, BRCA2, BRIP1, CDH1, CDK4, CDKN2A (p14ARF), CDKN2A (p16INK4a), CHEK2, CTNNA1, DICER1, EPCAM (Deletion/duplication testing only), GREM1 (promoter region deletion/duplication testing only), KIT, MEN1, MLH1, MSH2, MSH3, MSH6,  MUTYH, NBN, NF1, NHTL1, PALB2, PDGFRA, PMS2, POLD1, POLE, PTEN, RAD50, RAD51C, RAD51D, RNF43, SDHB, SDHC, SDHD, SMAD4, SMARCA4. STK11, TP53, TSC1, TSC2, and VHL.  The following genes were evaluated for sequence changes only: SDHA and HOXB13 c.251G>A variant only. The report date is December 24, 2018.  DICER1 VUS identified.  This will not change medical decision making.   02/10/2019 -  Chemotherapy   The patient had palonosetron (ALOXI) injection 0.25 mg, 0.25 mg, Intravenous,  Once, 7 of 8 cycles Administration: 0.25 mg (02/10/2019), 0.25 mg (02/24/2019), 0.25 mg (03/10/2019), 0.25 mg (03/17/2019), 0.25 mg (03/31/2019), 0.25 mg (04/14/2019), 0.25 mg (05/12/2019), 0.25 mg (05/30/2019), 0.25 mg (06/14/2019), 0.25 mg (06/28/2019), 0.25 mg (07/13/2019), 0.25 mg (08/04/2019) pegfilgrastim-jmdb (FULPHILA) injection 6 mg, 6 mg, Subcutaneous,  Once, 6 of 7 cycles Administration: 6 mg (02/25/2019), 6 mg (03/18/2019), 6 mg (05/31/2019), 6 mg (07/14/2019) pegfilgrastim-cbqv (UDENYCA) injection 6 mg, 6 mg, Subcutaneous, Once, 1 of 1 cycle Administration: 6 mg (06/29/2019) CISplatin (PLATINOL) 31 mg in sodium chloride 0.9 % 250 mL chemo infusion, 20 mg/m2 = 31 mg (80 % of original dose 25 mg/m2), Intravenous,  Once, 7 of 8 cycles Dose modification: 20 mg/m2 (80 % of original dose 25 mg/m2, Cycle 1, Reason: Other (see comments), Comment: renal dysfunction) Administration: 31 mg (02/10/2019), 31 mg (02/24/2019), 31 mg (03/10/2019), 31 mg (03/17/2019), 31 mg (03/31/2019), 31 mg (04/14/2019), 31 mg (05/30/2019), 31 mg (06/14/2019), 31 mg (06/28/2019), 31 mg (07/13/2019), 31 mg (08/04/2019) gemcitabine (GEMZAR) 1,140 mg in sodium chloride 0.9 % 250 mL chemo infusion,  750 mg/m2 = 1,140 mg (75 % of original dose 1,000 mg/m2), Intravenous,  Once, 7 of 8 cycles Dose modification: 750 mg/m2 (75 % of original dose 1,000 mg/m2, Cycle 1, Reason: Provider Judgment) Administration: 1,140 mg (02/10/2019), 1,140 mg (02/24/2019), 1,140 mg (03/10/2019), 1,140  mg (03/17/2019), 1,140 mg (03/31/2019), 1,140 mg (04/14/2019), 1,140 mg (05/12/2019), 1,140 mg (05/30/2019), 1,140 mg (06/14/2019), 1,140 mg (06/28/2019), 1,140 mg (07/13/2019), 1,140 mg (08/04/2019) fosaprepitant (EMEND) 150 mg, dexamethasone (DECADRON) 12 mg in sodium chloride 0.9 % 145 mL IVPB, , Intravenous,  Once, 7 of 8 cycles Administration:  (02/10/2019),  (02/24/2019),  (03/10/2019),  (03/17/2019),  (03/31/2019),  (04/14/2019),  (05/12/2019),  (05/30/2019),  (06/14/2019),  (06/28/2019),  (07/13/2019),  (08/04/2019)  for chemotherapy treatment.       CANCER STAGING: Cancer Staging Pancreatic cancer metastasized to liver [facility]) Staging form: Pancreas, AJCC 7th Edition - Clinical: Stage IV (T3, N1, M1) - Signed by Baird Cancer, PA-C on 03/16/2014 - Pathologic: No stage assigned - Unsigned    INTERVAL HISTORY:  Ms. Depaulo 86 y.o. female seen for follow-up and tox adjustment prior to next cycle of chemotherapy for metastatic pancreatic cancer.  Lost about 2 pounds since last visit.  Appetite and energy levels are 25%.  She is accompanied by her son today.  Apparently she has not been eating well as her son was away.  He usually fixes her meals.  She reports cough and shortness of breath at times.  No pain reported.  Numbness in the feet has been stable.  She started taking Ditropan XL last night.  REVIEW OF SYSTEMS:  Review of Systems  Respiratory: Positive for cough and shortness of breath.   Gastrointestinal: Positive for constipation.  Neurological: Positive for numbness.  All other systems reviewed and are negative.    PAST MEDICAL/SURGICAL HISTORY:  Past Medical History:  Diagnosis Date  . Anemia due to antineoplastic chemotherapy 09/12/2015   Started Aranesp 500 mcg on 09/12/2015  . Anxiety   . Chronic diarrhea   . Depression   . DNR (do not resuscitate) 08/16/2015  . Erosive esophagitis   . Family history of breast cancer in female   . Family history of prostate cancer   . GERD  (gastroesophageal reflux disease)   . Hx of adenomatous colonic polyps    tubular adenomas, last found in 2008  . Hyperplastic colon polyp 03/19/10   tcs by Dr. Gala Romney  . Hypertension 20 years   . Kidney stone    hx/ crushed   . Opioid contract exists 04/18/2015   With Dr. Moshe Cipro  . Pancreatic cancer ([place]) 02/2014  . Pancreatic cancer metastasized to liver ([place]) 03/10/2014  . Schatzki's ring 08/26/10   Last dilated on EGD by Dr. Trevor Iha [facility], linear gastric erosions, BI hemigastrectomy   Past Surgical History:  Procedure Laterality Date  . BREAST LUMPECTOMY Left   . bunion removal     from both feet   . CHOLECYSTECTOMY  1965   . COLONOSCOPY  03/19/2010   DR Gala Romney,, normal TI, pancolonic diverticula, random colon bx neg., hyperplastic polyps removed  . ESOPHAGOGASTRODUODENOSCOPY  11/22/2003   DR Gala Romney, erosive RE, Billroth I  . ESOPHAGOGASTRODUODENOSCOPY  08/26/10   Dr. Gala Romney- moderate severe ERE, Scahtzki ring s/p dilation, Billroth I, linear gastric erosions, bx-gastric xanthelasma  . LEFT SHOULDER SURGERY  2009   DR HARRISON  . ORIF ANKLE FRACTURE Right 05/22/2013   Procedure: OPEN REDUCTION INTERNAL FIXATION (ORIF) RIGHT ANKLE FRACTURE;  Surgeon: Sanjuana Kava, MD;  Location: [facility];  Service: Orthopedics;  Laterality: Right;  . stomach ulcer  50 years ago    had some of her stomach removed      SOCIAL HISTORY:  Social History   Socioeconomic History  . Marital status: Widowed    Spouse name: Not on file  . Number of children: 2  . Years of education: 40  . Highest education level: 12th grade  Occupational History  . Occupation: retired from Estate manager/land agent: RETIRED  Tobacco Use  . Smoking status: Current Some Day Smoker    Packs/day: 0.50    Years: 60.00    Pack years: 30.00    Types: Cigarettes    Last attempt to quit: 09/09/2016    Years since quitting: 2.9  . Smokeless tobacco: Never Used  Substance and Sexual Activity  . Alcohol use: No  . Drug use: No   . Sexual activity: Not Currently  Other Topics Concern  . Not on file  Social History Narrative   Lives with son alone    Social Determinants of Health   Financial Resource Strain:   . Difficulty of Paying Living Expenses:   Food Insecurity:   . Worried About Charity fundraiser in the Last Year:   . Arboriculturist in the Last Year:   Transportation Needs:   . Film/video editor (Medical):   Marland Kitchen Lack of Transportation (Non-Medical):   Physical Activity:   . Days of Exercise per Week:   . Minutes of Exercise per Session:   Stress:   . Feeling of Stress :   Social Connections:   . Frequency of Communication with Friends and Family:   . Frequency of Social Gatherings with Friends and Family:   . Attends Religious Services:   . Active Member of Clubs or Organizations:   . Attends Archivist Meetings:   Marland Kitchen Marital Status:   Intimate Partner Violence:   . Fear of Current or Ex-Partner:   . Emotionally Abused:   Marland Kitchen Physically Abused:   . Sexually Abused:     FAMILY HISTORY:  Family History  Problem Relation Age of Onset  . Hypertension Mother   . Kidney failure Brother        on dialysis  . Diabetes Sister   . Diabetes Sister   . Hypertension Father   . Kidney failure Sister   . Kidney Stones Brother   . Breast cancer Son 43  . Gout Son 79  . Gout Nephew 57  . Liver disease Neg Hx   . Colon cancer Neg Hx     CURRENT MEDICATIONS:  Outpatient Encounter Medications as of 08/04/2019  Medication Sig  . amLODipine (NORVASC) 10 MG tablet TAKE 1 TABLET DAILY  . CISPLATIN IV Inject into the vein. Days 1, 8 every 21 days  . clonazePAM (KLONOPIN) 0.5 MG tablet Take 1 tablet (0.5 mg total) by mouth at bedtime.  . cycloSPORINE (RESTASIS) 0.05 % ophthalmic emulsion Place 1 drop into both eyes 2 (two) times daily.  . ergocalciferol (VITAMIN D2) 1.25 MG (50000 UT) capsule Take 1 capsule (50,000 Units total) by mouth once a week.  . fluorometholone (FML) 0.1 %  ophthalmic suspension INSTILL 1 DROP INTO EACH EYE THREE TIMES DAILY FOR 14 DAYS  . fluticasone (FLONASE) 50 MCG/ACT nasal spray Place 1 spray into both nostrils daily.  Marland Kitchen GEMCITABINE HCL IV Inject into the vein. Days 1, 8 every 21 days  . lidocaine (XYLOCAINE) 2 %  solution RINSE WITH 5ML AS NEEDED TO RELIEVE MOUTH PAIN.  . metoprolol tartrate (LOPRESSOR) 25 MG tablet TAKE 1 TABLET DAILY FOR BLOOD PRESSURE (DOSE REDUCTION)  . mirtazapine (REMERON) 15 MG tablet Take 15 mg by mouth at bedtime.  Marland Kitchen oxybutynin (DITROPAN XL) 5 MG 24 hr tablet Take 1 tablet (5 mg total) by mouth at bedtime.  . pantoprazole (PROTONIX) 20 MG tablet TAKE 1 TABLET DAILY  . senna-docusate (SENOKOT-S) 8.6-50 MG tablet Take 1 tablet by mouth daily.  Marland Kitchen acetaminophen (TYLENOL) 500 MG tablet Take 500 mg by mouth every 6 (six) hours as needed for mild pain or moderate pain.   . clobetasol (TEMOVATE) 0.05 % external solution APP AA ON SCALP BID PRN NOT TO FACE GROIN OR UNDERARMS  . ergocalciferol (VITAMIN D2) 1.25 MG (50000 UT) capsule Take 1 capsule (50,000 Units total) by mouth once a week.  . fluticasone (CUTIVATE) 0.05 % cream APPLY TO THE AFFECTED AREA FACE TWICE DAILY AS NEEDED  . lidocaine-prilocaine (EMLA) cream Apply 1 application topically as needed. Apply to portacath site as needed (Patient not taking: Reported on 08/04/2019)  . loperamide (IMODIUM A-D) 2 MG tablet Take 2 mg by mouth 4 (four) times daily as needed for diarrhea or loose stools.  . [DISCONTINUED] prochlorperazine (COMPAZINE) 10 MG tablet Take 1 tablet (10 mg total) by mouth every 6 (six) hours as needed (Nausea or vomiting).   No facility-administered encounter medications on file as of 08/04/2019.    ALLERGIES:  Allergies  Allergen Reactions  . Iohexol Hives and Shortness Of Breath    **Patient can not have IV contrast, pt has breakthrough reaction even after 13 hour premeds** Do not give IV contrast PATIENT HAD TO BE TAKEN TO ED, C/O WHELPS, HIVES,  DIFFICULTY BREATHING. PATIENT GIVEN IV CONTRAST AFTER 13 HOUR PRE MEDS ON 01/09/2017 WITH NO REACTION NOTED. On 10/19/17 patient had reaction despite premedication.    Marland Kitchen Aciphex [Rabeprazole Sodium] Other (See Comments)    unknown  . Amlodipine Besylate-Valsartan     Rash to high-dose (5/320)  . Esomeprazole Magnesium   . Omeprazole Other (See Comments)    Patient states "medication didn't work"  . Ciprofloxacin Rash  . Penicillins Swelling and Rash    Has patient had a PCN reaction causing immediate rash, facial/tongue/throat swelling, SOB or lightheadedness with hypotension: Yes Has patient had a PCN reaction causing severe rash involving mucus membranes or skin necrosis: Yes Has patient had a PCN reaction that required hospitalization: No Has patient had a PCN reaction occurring within the last 10 years: yes If all of the above answers are "NO", then may proceed with Cephalosporin use.   [name] [Benzonatate] Itching    Full head, face, neck, and chest itching.      PHYSICAL EXAM:  ECOG Performance status: 1  Vitals:   08/04/19 0749  BP: (!) 159/57  Pulse: (!) 56  Resp: 18  Temp: (!) 96.9 F (36.1 C)  SpO2: 99%   Filed Weights   08/04/19 0749  Weight: 110 lb 14.4 oz (50.3 kg)    Physical Exam Vitals reviewed.  Constitutional:      Appearance: Normal appearance.  Cardiovascular:     Rate and Rhythm: Normal rate and regular rhythm.     Heart sounds: Normal heart sounds.  Pulmonary:     Effort: Pulmonary effort is normal.     Breath sounds: Normal breath sounds.  Abdominal:     General: There is no distension.  Palpations: Abdomen is soft. There is no mass.  Musculoskeletal:        General: No swelling.  Skin:    General: Skin is warm.  Neurological:     General: No focal deficit present.     Mental Status: She is alert and oriented to person, place, and time.  Psychiatric:        Mood and Affect: Mood normal.        Behavior: Behavior normal.       LABORATORY DATA:  I have reviewed the labs as listed.  CBC    Component Value Date/Time   WBC 7.3 08/04/2019 0803   RBC 2.66 (L) 08/04/2019 0803   HGB 8.1 (L) 08/04/2019 0803   HCT 25.4 (L) 08/04/2019 0803   PLT 191 08/04/2019 0803   MCV 95.5 08/04/2019 0803   MCH 30.5 08/04/2019 0803   MCHC 31.9 08/04/2019 0803   RDW 18.1 (H) 08/04/2019 0803   LYMPHSABS 1.1 08/04/2019 0803   MONOABS 0.5 08/04/2019 0803   EOSABS 0.2 08/04/2019 0803   BASOSABS 0.0 08/04/2019 0803   CMP Latest Ref Rng & Units 08/04/2019 07/27/2019 07/13/2019  Glucose 70 - 99 mg/dL 112(H) 106(H) 102(H)  BUN 8 - 23 mg/dL 23 25(H) 22  Creatinine 0.44 - 1.00 mg/dL 1.38(H) 1.53(H) 1.31(H)  Sodium 135 - 145 mmol/L 138 139 140  Potassium 3.5 - 5.1 mmol/L 4.0 4.1 3.8  Chloride 98 - 111 mmol/L 105 106 108  CO2 22 - 32 mmol/L '24 24 23  '$ Calcium 8.9 - 10.3 mg/dL 8.0(L) 8.0(L) 8.0(L)  Total Protein 6.5 - 8.1 g/dL 7.0 7.2 7.1  Total Bilirubin 0.3 - 1.2 mg/dL 0.4 0.3 0.5  Alkaline Phos 38 - 126 U/L 209(H) 226(H) 213(H)  AST 15 - 41 U/L '24 17 19  '$ ALT 0 - 44 U/L '14 14 13       '$ DIAGNOSTIC IMAGING:  I have reviewed her scans.   I have reviewed Venita Lick LPN's note and agree with the documentation.  I personally performed a face-to-face visit, made revisions and my assessment and plan is as follows.    ASSESSMENT & PLAN:   Pancreatic cancer metastasized to liver ([place]) 1.  Metastatic pancreatic cancer to the liver: -6 cycles of gemcitabine and cisplatin day 1 and day 15 from 02/10/2019 through 07/13/2019. -CT CAP on 06/01/2019 showed stable right hepatic lesion.  Segment 2 liver lesion measures 2.4 x 1.9 cm.  Left and right omental nodules increased in size by 5 mm.  Study limited by lack of IV contrast. -She lost 2 pounds since last week.  I held her chemotherapy because of high creatinine. -Today we reviewed labs.  White count and platelets are normal.  Creatinine improved to 1.3. -She will proceed with cycle  6-day 15 today.  She will come back tomorrow for IV hydration. -I will reevaluate her in 2 weeks for cycle 7.  I plan to repeat scans after cycle 7.  2.  Weight loss: -She lost 2 pounds in the last 1 week.  She will eat better as her son who fixes her meals has come back from a trip.  3.  Genetic testing: -Germline mutation testing was negative on 12/24/2018.  4.  Renal insufficiency: -Creatinine today improved to 1.38. -She will proceed with cisplatin today.  She will come back tomorrow for IV fluids.  5.  Vitamin D deficiency: -Latest vitamin D is 8.48.  We will prescribe vitamin D 50,000 units weekly.  6.  Overactive  bladder: -She started taking Ditropan XL 5 mg last night.  We will monitor the response.  She has tried Myrbetriq in the past without help.  7.  Normocytic anemia: -This is from combination of CKD and bone marrow suppression. -Hemoglobin today is 8.1.  No transfusion needed.     Orders placed this encounter:  No orders of the defined types were placed in this encounter.    Derek Jack, MD [place] [phone]

## 2019-08-04 NOTE — Patient Instructions (Signed)
[facility] at [facility] Discharge Instructions  Labs drawn from portacath today   Thank you for choosing [facility] at [facility] to provide your oncology and hematology care.  To afford each patient quality time with our provider, please arrive at least 15 minutes before your scheduled appointment time.   If you have a lab appointment with the Cancer Center please come in thru the Main Entrance and check in at the main information desk.  You need to re-schedule your appointment should you arrive 10 or more minutes late.  We strive to give you quality time with our providers, and arriving late affects you and other patients whose appointments are after yours.  Also, if you no show three or more times for appointments you may be dismissed from the clinic at the providers discretion.     Again, thank you for choosing [facility].  Our hope is that these requests will decrease the amount of time that you wait before being seen by our physicians.       _____________________________________________________________  Should you have questions after your visit to [facility], please contact our office at [phone] between the hours of 8:00 a.m. and 4:30 p.m.  Voicemails left after 4:00 p.m. will not be returned until the following business day.  For prescription refill requests, have your pharmacy contact our office and allow 72 hours.    Due to Covid, you will need to wear a mask upon entering the hospital. If you do not have a mask, a mask will be given to you at the Main Entrance upon arrival. For doctor visits, patients may have 1 support person with them. For treatment visits, patients can not have anyone with them due to social distancing guidelines and our immunocompromised population.     

## 2019-08-04 NOTE — Progress Notes (Signed)
Patient has been assessed, vital signs and labs have been reviewed by Dr. Delton Coombes. ANC,  LFTs, and Platelets are within treatment parameters per Dr. Delton Coombes. The patient is good to proceed with treatment at this time. Creatinine is still elevated, please give fluids tomorrow as well per Dr. Delton Coombes.

## 2019-08-04 NOTE — Patient Instructions (Signed)

## 2019-08-04 NOTE — Progress Notes (Signed)
Patient presents today for treatment and follow up visit with Dr. Delton Coombes. MAR reviewed and updated. Labs pending. Patient has no complaints of any pain today. Patient denies any changes since her last visit.   Message received to proceed with treatment . Creatinine 1.38. Message received from [facility] LPN/ Dr. Delton Coombes , patient to return tomorrow for fluids under supportive therapy plan. Scheduling notified. Appointment confirmed.   Per Verbal Order from Dr. Delton Coombes. Patient may proceed with treatment without 213ml void for Cisplatin. Patient taking Ditropan xl at this time. Patient will received hydration today and tomorrow.   Treatment given today per MD orders. Tolerated infusion without adverse affects. Vital signs stable. No complaints at this time. Discharged from clinic ambulatory. F/U with [facility] as scheduled.

## 2019-08-04 NOTE — Assessment & Plan Note (Signed)
1.  Metastatic pancreatic cancer to the liver: -6 cycles of gemcitabine and cisplatin day 1 and day 15 from 02/10/2019 through 07/13/2019. -CT CAP on 06/01/2019 showed stable right hepatic lesion.  Segment 2 liver lesion measures 2.4 x 1.9 cm.  Left and right omental nodules increased in size by 5 mm.  Study limited by lack of IV contrast. -She lost 2 pounds since last week.  I held her chemotherapy because of high creatinine. -Today we reviewed labs.  White count and platelets are normal.  Creatinine improved to 1.3. -She will proceed with cycle 6-day 15 today.  She will come back tomorrow for IV hydration. -I will reevaluate her in 2 weeks for cycle 7.  I plan to repeat scans after cycle 7.  2.  Weight loss: -She lost 2 pounds in the last 1 week.  She will eat better as her son who fixes her meals has come back from a trip.  3.  Genetic testing: -Germline mutation testing was negative on 12/24/2018.  4.  Renal insufficiency: -Creatinine today improved to 1.38. -She will proceed with cisplatin today.  She will come back tomorrow for IV fluids.  5.  Vitamin D deficiency: -Latest vitamin D is 8.48.  We will prescribe vitamin D 50,000 units weekly.  6.  Overactive bladder: -She started taking Ditropan XL 5 mg last night.  We will monitor the response.  She has tried Myrbetriq in the past without help.  7.  Normocytic anemia: -This is from combination of CKD and bone marrow suppression. -Hemoglobin today is 8.1.  No transfusion needed.

## 2019-08-05 ENCOUNTER — Encounter (HOSPITAL_COMMUNITY): Payer: Self-pay

## 2019-08-05 ENCOUNTER — Inpatient Hospital Stay (HOSPITAL_COMMUNITY): Payer: Medicare HMO

## 2019-08-05 VITALS — BP 145/49 | HR 65 | Temp 97.1°F | Resp 18

## 2019-08-05 DIAGNOSIS — D6481 Anemia due to antineoplastic chemotherapy: Secondary | ICD-10-CM

## 2019-08-05 DIAGNOSIS — C259 Malignant neoplasm of pancreas, unspecified: Secondary | ICD-10-CM

## 2019-08-05 DIAGNOSIS — C787 Secondary malignant neoplasm of liver and intrahepatic bile duct: Secondary | ICD-10-CM

## 2019-08-05 DIAGNOSIS — Z5111 Encounter for antineoplastic chemotherapy: Secondary | ICD-10-CM | POA: Diagnosis not present

## 2019-08-05 MED ORDER — SODIUM CHLORIDE 0.9 % IV SOLN
Freq: Once | INTRAVENOUS | Status: AC
  Filled 2019-08-05: qty 1000

## 2019-08-05 MED ORDER — HEPARIN SOD (PORK) LOCK FLUSH 100 UNIT/ML IV SOLN
500.0000 [IU] | Freq: Once | INTRAVENOUS | Status: AC | PRN
  Administered 2019-08-05: 500 [IU]

## 2019-08-05 MED ORDER — PEGFILGRASTIM-JMDB 6 MG/0.6ML ~~LOC~~ SOSY
6.0000 mg | PREFILLED_SYRINGE | Freq: Once | SUBCUTANEOUS | Status: AC
  Administered 2019-08-05: 6 mg via SUBCUTANEOUS
  Filled 2019-08-05: qty 0.6

## 2019-08-05 MED ORDER — SODIUM CHLORIDE 0.9% FLUSH
10.0000 mL | Freq: Once | INTRAVENOUS | Status: AC | PRN
  Administered 2019-08-05: 10 mL

## 2019-08-05 MED ORDER — ALTEPLASE 2 MG IJ SOLR: 2.0000 mg | Freq: Once | INTRAMUSCULAR | Status: DC | PRN

## 2019-08-05 NOTE — Progress Notes (Signed)
[name] tolerated IV hydration with magnesium and potassium and Fulphila injection well without complaints or incident. VSS upon discharge. Pt discharged self ambulatory using her cane in satisfactory condition

## 2019-08-05 NOTE — Patient Instructions (Signed)
[place] at [facility] Discharge Instructions  Received IV hydration with magnesium and potassium and Fulphila injection today. Follow-up as scheduled. Call clinic for any questions or concerns   Thank you for choosing [place] at [facility] to provide your oncology and hematology care.  To afford each patient quality time with our provider, please arrive at least 15 minutes before your scheduled appointment time.   If you have a lab appointment with the [facility] please come in thru the Main Entrance and check in at the main information desk.  You need to re-schedule your appointment should you arrive 10 or more minutes late.  We strive to give you quality time with our providers, and arriving late affects you and other patients whose appointments are after yours.  Also, if you no show three or more times for appointments you may be dismissed from the clinic at the providers discretion.     Again, thank you for choosing [facility].  Our hope is that these requests will decrease the amount of time that you wait before being seen by our physicians.       _____________________________________________________________  Should you have questions after your visit to [facility], please contact our office at [phone] between the hours of 8:00 a.m. and 4:30 p.m.  Voicemails left after 4:00 p.m. will not be returned until the following business day.  For prescription refill requests, have your pharmacy contact our office and allow 72 hours.    Due to Covid, you will need to wear a mask upon entering the hospital. If you do not have a mask, a mask will be given to you at the Main Entrance upon arrival. For doctor visits, patients may have 1 support person with them. For treatment visits, patients can not have anyone with them due to social distancing guidelines and our immunocompromised population.

## 2019-08-09 ENCOUNTER — Other Ambulatory Visit (HOSPITAL_COMMUNITY): Payer: Self-pay | Admitting: *Deleted

## 2019-08-09 DIAGNOSIS — C259 Malignant neoplasm of pancreas, unspecified: Secondary | ICD-10-CM

## 2019-08-09 DIAGNOSIS — C787 Secondary malignant neoplasm of liver and intrahepatic bile duct: Secondary | ICD-10-CM

## 2019-08-10 ENCOUNTER — Other Ambulatory Visit: Payer: Self-pay | Admitting: Family Medicine

## 2019-08-14 ENCOUNTER — Other Ambulatory Visit (HOSPITAL_COMMUNITY): Payer: Self-pay | Admitting: Hematology

## 2019-08-15 NOTE — Progress Notes (Signed)

## 2019-08-18 ENCOUNTER — Other Ambulatory Visit: Payer: Self-pay

## 2019-08-18 ENCOUNTER — Inpatient Hospital Stay (HOSPITAL_COMMUNITY): Payer: Medicare HMO

## 2019-08-18 ENCOUNTER — Inpatient Hospital Stay (HOSPITAL_COMMUNITY): Payer: Medicare HMO | Attending: Hematology

## 2019-08-18 ENCOUNTER — Inpatient Hospital Stay (HOSPITAL_BASED_OUTPATIENT_CLINIC_OR_DEPARTMENT_OTHER): Payer: Medicare HMO | Admitting: Hematology

## 2019-08-18 ENCOUNTER — Encounter (HOSPITAL_COMMUNITY): Payer: Self-pay | Admitting: Hematology

## 2019-08-18 VITALS — BP 157/50 | HR 57 | Temp 96.7°F | Resp 18 | Wt 111.8 lb

## 2019-08-18 DIAGNOSIS — C787 Secondary malignant neoplasm of liver and intrahepatic bile duct: Secondary | ICD-10-CM | POA: Insufficient documentation

## 2019-08-18 DIAGNOSIS — D649 Anemia, unspecified: Secondary | ICD-10-CM | POA: Insufficient documentation

## 2019-08-18 DIAGNOSIS — C259 Malignant neoplasm of pancreas, unspecified: Secondary | ICD-10-CM | POA: Diagnosis not present

## 2019-08-18 DIAGNOSIS — Z5111 Encounter for antineoplastic chemotherapy: Secondary | ICD-10-CM | POA: Diagnosis not present

## 2019-08-18 DIAGNOSIS — C252 Malignant neoplasm of tail of pancreas: Secondary | ICD-10-CM | POA: Diagnosis present

## 2019-08-18 DIAGNOSIS — T451X5A Adverse effect of antineoplastic and immunosuppressive drugs, initial encounter: Secondary | ICD-10-CM

## 2019-08-18 DIAGNOSIS — Z5189 Encounter for other specified aftercare: Secondary | ICD-10-CM | POA: Diagnosis not present

## 2019-08-18 DIAGNOSIS — D6481 Anemia due to antineoplastic chemotherapy: Secondary | ICD-10-CM | POA: Diagnosis not present

## 2019-08-18 LAB — COMPREHENSIVE METABOLIC PANEL
ALT: 10 U/L (ref 0–44)
AST: 15 U/L (ref 15–41)
Albumin: 3.2 g/dL — ABNORMAL LOW (ref 3.5–5.0)
Alkaline Phosphatase: 202 U/L — ABNORMAL HIGH (ref 38–126)
Anion gap: 8 (ref 5–15)
BUN: 25 mg/dL — ABNORMAL HIGH (ref 8–23)
CO2: 23 mmol/L (ref 22–32)
Calcium: 8 mg/dL — ABNORMAL LOW (ref 8.9–10.3)
Chloride: 108 mmol/L (ref 98–111)
Creatinine, Ser: 1.36 mg/dL — ABNORMAL HIGH (ref 0.44–1.00)
GFR calc Af Amer: 40 mL/min — ABNORMAL LOW (ref >60)
GFR calc non Af Amer: 35 mL/min — ABNORMAL LOW (ref >60)
Glucose, Bld: 100 mg/dL — ABNORMAL HIGH (ref 70–99)
Potassium: 3.9 mmol/L (ref 3.5–5.1)
Sodium: 139 mmol/L (ref 135–145)
Total Bilirubin: 0.7 mg/dL (ref 0.3–1.2)
Total Protein: 6.9 g/dL (ref 6.5–8.1)

## 2019-08-18 LAB — CBC WITH DIFFERENTIAL/PLATELET
Abs Immature Granulocytes: 0.06 10*3/uL (ref 0.00–0.07)
Basophils Absolute: 0 10*3/uL (ref 0.0–0.1)
Basophils Relative: 0 %
Eosinophils Absolute: 0.2 10*3/uL (ref 0.0–0.5)
Eosinophils Relative: 2 %
HCT: 22.8 % — ABNORMAL LOW (ref 36.0–46.0)
Hemoglobin: 7.3 g/dL — ABNORMAL LOW (ref 12.0–15.0)
Immature Granulocytes: 1 %
Lymphocytes Relative: 10 %
Lymphs Abs: 1.1 10*3/uL (ref 0.7–4.0)
MCH: 30.7 pg (ref 26.0–34.0)
MCHC: 32 g/dL (ref 30.0–36.0)
MCV: 95.8 fL (ref 80.0–100.0)
Monocytes Absolute: 0.5 10*3/uL (ref 0.1–1.0)
Monocytes Relative: 5 %
Neutro Abs: 8.7 10*3/uL — ABNORMAL HIGH (ref 1.7–7.7)
Neutrophils Relative %: 82 %
Platelets: 105 10*3/uL — ABNORMAL LOW (ref 150–400)
RBC: 2.38 MIL/uL — ABNORMAL LOW (ref 3.87–5.11)
RDW: 17.6 % — ABNORMAL HIGH (ref 11.5–15.5)
WBC: 10.5 10*3/uL (ref 4.0–10.5)
nRBC: 0 % (ref 0.0–0.2)

## 2019-08-18 LAB — VITAMIN D 25 HYDROXY (VIT D DEFICIENCY, FRACTURES): Vit D, 25-Hydroxy: 30.43 ng/mL (ref 30–100)

## 2019-08-18 LAB — MAGNESIUM: Magnesium: 1.8 mg/dL (ref 1.7–2.4)

## 2019-08-18 LAB — PREPARE RBC (CROSSMATCH)

## 2019-08-18 MED ORDER — SODIUM CHLORIDE 0.9 % IV SOLN
Freq: Once | INTRAVENOUS | Status: AC
  Filled 2019-08-18: qty 150

## 2019-08-18 MED ORDER — POTASSIUM CHLORIDE 2 MEQ/ML IV SOLN
Freq: Once | INTRAVENOUS | Status: AC
  Filled 2019-08-18: qty 10

## 2019-08-18 MED ORDER — PALONOSETRON HCL INJECTION 0.25 MG/5ML
0.2500 mg | Freq: Once | INTRAVENOUS | Status: AC
  Administered 2019-08-18: 0.25 mg via INTRAVENOUS
  Filled 2019-08-18: qty 5

## 2019-08-18 MED ORDER — SODIUM CHLORIDE 0.9 % IV SOLN: Freq: Once | INTRAVENOUS | Status: AC

## 2019-08-18 MED ORDER — SODIUM CHLORIDE 0.9 % IV SOLN
20.0000 mg/m2 | Freq: Once | INTRAVENOUS | Status: AC
  Administered 2019-08-18: 13:00:00 31 mg via INTRAVENOUS
  Filled 2019-08-18: qty 31

## 2019-08-18 MED ORDER — SODIUM CHLORIDE 0.9 % IV SOLN
750.0000 mg/m2 | Freq: Once | INTRAVENOUS | Status: AC
  Administered 2019-08-18: 13:00:00 1140 mg via INTRAVENOUS
  Filled 2019-08-18: qty 5.26

## 2019-08-18 MED ORDER — HEPARIN SOD (PORK) LOCK FLUSH 100 UNIT/ML IV SOLN
500.0000 [IU] | Freq: Once | INTRAVENOUS | Status: AC | PRN
  Administered 2019-08-18: 500 [IU]

## 2019-08-18 MED ORDER — SODIUM CHLORIDE 0.9 % IV SOLN
10.0000 mg | Freq: Once | INTRAVENOUS | Status: AC
  Administered 2019-08-18: 10 mg via INTRAVENOUS
  Filled 2019-08-18: qty 10

## 2019-08-18 MED ORDER — SODIUM CHLORIDE 0.9% FLUSH
10.0000 mL | INTRAVENOUS | Status: DC | PRN
  Administered 2019-08-18 (×2): 10 mL

## 2019-08-18 NOTE — Progress Notes (Signed)
Labs reviewed with MD today. Hemoglobin 7.3, will given a unit of blood tomorrow per MD. Proceed with treatment today per MD.   Treatment given per orders. Patient tolerated it well without problems. Vitals stable and discharged home from clinic ambulatory. Follow up as scheduled.

## 2019-08-18 NOTE — Progress Notes (Signed)
[place], [place] [address]   CLINIC:  Medical Oncology/Hematology  PCP:  Fayrene Helper, MD [address], [place] [address] [phone]   REASON FOR VISIT:  Follow-up for metastatic pancreatic cancer to the liver   BRIEF ONCOLOGIC HISTORY:  Oncology History  Pancreatic cancer metastasized to liver ([place])  03/10/2014 Initial Diagnosis   Pancreatic cancer metastasized to liver   03/22/2014 - 03/05/2016 Chemotherapy   Abraxane/Gemzar days 1, 8, every 28 days.  Day 15 was cancelled due to leukopenia and thrombocytopenia on day 15 cycle 1.   05/24/2014 Treatment Plan Change   Day 8 of cycle 3 is held with ANC of 1.1   06/05/2014 Imaging   CT C/A/P . Interval decrease in size of the pancreatic tail mass.Improved hepatic metastatic disease. No new lesions. No CT findings for metastatic disease involving the chest.   08/09/2014 Tumor Marker   CA 19-9= 33 (WNL)   10/26/2014 Imaging   MRI- Continued interval decrease in size of the hepatic metastatic lesions and no new lesions are identified. Continued decrease in size of the pancreatic tail lesion.   01/03/2015 Tumor Marker   Results for ANNAYA, BANGERT (MRN [id number]) as of 01/18/2015 12:52  01/03/2015 10:00 CA 19-9: 14    01/17/2015 Imaging   MRI- Response to therapy of hepatic metastasis.  Similar size of a pancreatic tail lesion.   03/20/2015 Imaging   MRI-L spine- Severe disc space narrowing at L2-L3, with endplate reactive changes. Large disc extrusion into the ventral epidural space,central to the RIGHT with a cephalad migrated free fragment. Additional Large disc extrusion into the retroperitone...   03/22/2015 Imaging   CT pelvis- No evidence for metastatic disease within the pelvis.   07/24/2015 Imaging   MRI abd- Continued response to therapy, with no residual detectable liver metastases. No new sites of metastatic disease in the abdomen.    08/15/2015 Code Status      She confirms desire for DNR status.   12/21/2015 Imaging   MRI liver- Severe image degradation due to motion artifact reducing diagnostic sensitivity and specificity. Reduce conspicuity of the pancreatic tail lesion suggesting further improvement. The original liver lesions have resolved.   03/05/2016 Treatment Plan Change   Chemotherapy holiday/Break after 2 years worth of treatment   04/07/2016 Imaging   CT chest- When compared to recent chest CT, new minimally displaced anterior left sixth rib fracture. Slight increase in subcarinal adenopathy   04/28/2016 Imaging   Bone density- BMD as determined from Femur Neck Right is 0.705 g/cm2 with a T-Score of -2.4. This patient is considered osteopenic according to [facility]) criteria. Compared with the prior study on 02/23/2013, the BMD of the lumbar spine/rt. femoral neck show a statistically significant decrease.    05/23/2016 Imaging   MRI abd- 1. Exam is significantly degraded by patient respiratory motion. Consider follow-up exams with CT abdomen with without contrast per pancreatic protocol. 2. Fullness in tail of pancreas is more prominent and potentially increased in size. Recommend close attention on follow-up. (Consider CT as above) 3. No explanation for back pain.   09/11/2016 Imaging   MRI pancreas- Interval progression of pancreatic tail lesion. New differential perfusion right liver with areas of heterogeneity. Underlying metastatic disease in this region not excluded.   09/11/2016 Progression   MRi in conjunction with rising CA 19-9 are indicative of relapse of disease.   01/09/2017 Progression   CT C/A/P: 2.1 x  2.9 cm lesion in the pancreatic tail and abutting the splenic hilum, corresponding to known primary pancreatic neoplasm, mildly increased.  Scattered small hypoenhancing lesions in the liver measuring up to 10 mm, approximately 8-10 in number, suspicious for hepatic metastases.  No  findings specific for metastatic disease in the chest.  Additional ancillary findings as above.    01/27/2017 - 10/20/2017 Chemotherapy   The patient had gemcitabine (GEMZAR) 1,292 mg in sodium chloride 0.9 % 250 mL chemo infusion, 800 mg/m2 = 1,292 mg, Intravenous,  Once, 9 of 12 cycles Administration: 1,292 mg (01/28/2017), 1,292 mg (02/11/2017), 1,292 mg (02/25/2017), 1,292 mg (03/11/2017), 1,292 mg (03/25/2017), 1,292 mg (04/08/2017), 1,292 mg (04/22/2017), 1,292 mg (04/29/2017), 1,292 mg (05/20/2017), 1,292 mg (06/03/2017), 1,292 mg (06/24/2017), 1,292 mg (07/08/2017), 1,292 mg (07/29/2017), 1,292 mg (08/12/2017), 1,292 mg (08/26/2017), 1,292 mg (09/09/2017), 1,292 mg (09/23/2017), 1,292 mg (10/07/2017)  for chemotherapy treatment.    10/21/2017 - 09/22/2018 Chemotherapy   The patient had leucovorin 700 mg in dextrose 5 % 250 mL infusion, 644 mg, Intravenous,  Once, 22 of 22 cycles Administration: 700 mg (10/21/2017), 700 mg (11/04/2017), 700 mg (11/18/2017), 700 mg (12/02/2017), 700 mg (12/16/2017), 700 mg (12/30/2017), 700 mg (01/13/2018), 700 mg (01/27/2018), 700 mg (02/10/2018), 700 mg (02/24/2018), 700 mg (03/10/2018), 700 mg (03/24/2018), 700 mg (04/28/2018), 700 mg (05/17/2018), 700 mg (05/31/2018), 700 mg (06/16/2018), 700 mg (06/30/2018), 700 mg (07/21/2018), 700 mg (08/04/2018), 700 mg (08/25/2018), 700 mg (09/08/2018) ondansetron (ZOFRAN) 8 mg in sodium chloride 0.9 % 50 mL IVPB, , Intravenous,  Once, 21 of 21 cycles Administration:  (11/04/2017),  (01/27/2018),  (02/10/2018),  (02/24/2018),  (03/10/2018),  (03/24/2018),  (04/28/2018),  (05/17/2018),  (05/31/2018),  (06/16/2018),  (06/30/2018),  (07/21/2018),  (08/04/2018),  (08/25/2018),  (09/08/2018) fluorouracil (ADRUCIL) 3,850 mg in sodium chloride 0.9 % 73 mL chemo infusion, 2,400 mg/m2 = 3,850 mg, Intravenous, 1 Day/Dose, 22 of 22 cycles Dose modification: 1,920 mg/m2 (original dose 2,400 mg/m2, Cycle 2, Reason: Provider Judgment, Comment: mucisitis) Administration: 3,850 mg  (10/21/2017), 3,100 mg (11/04/2017), 3,100 mg (11/18/2017), 3,100 mg (12/02/2017), 3,100 mg (12/16/2017), 3,100 mg (12/30/2017), 3,100 mg (01/13/2018), 3,100 mg (01/27/2018), 3,100 mg (02/10/2018), 3,100 mg (02/24/2018), 3,100 mg (03/10/2018), 3,100 mg (03/24/2018), 3,100 mg (04/28/2018), 3,100 mg (05/17/2018), 3,100 mg (05/31/2018), 3,100 mg (06/16/2018), 3,100 mg (06/30/2018), 3,100 mg (07/21/2018), 3,100 mg (08/04/2018), 3,100 mg (08/25/2018), 3,100 mg (09/08/2018) irinotecan LIPOSOME (ONIVYDE) 81.7 mg in sodium chloride 0.9 % 500 mL chemo infusion, 50 mg/m2 = 81.7 mg (100 % of original dose 50 mg/m2), Intravenous, Once, 22 of 22 cycles Dose modification: 50 mg/m2 (original dose 50 mg/m2, Cycle 1, Reason: Provider Judgment), 50 mg/m2 (original dose 50 mg/m2, Cycle 2, Reason: Provider Judgment) Administration: 81.7 mg (10/21/2017), 81.7 mg (11/04/2017), 81.7 mg (11/18/2017), 81.7 mg (12/02/2017), 81.7 mg (12/16/2017), 81.7 mg (12/30/2017), 81.7 mg (01/13/2018), 81.7 mg (01/27/2018), 81.7 mg (02/10/2018), 81.7 mg (02/24/2018), 81.7 mg (03/10/2018), 81.7 mg (03/24/2018), 81.7 mg (04/28/2018), 81.7 mg (05/17/2018), 81.7 mg (05/31/2018), 81.7 mg (06/16/2018), 81.7 mg (06/30/2018), 81.7 mg (07/21/2018), 81.7 mg (08/04/2018), 81.7 mg (08/25/2018), 81.7 mg (09/08/2018)  for chemotherapy treatment.    09/22/2018 - 01/11/2019 Chemotherapy   The patient had palonosetron (ALOXI) injection 0.25 mg, 0.25 mg, Intravenous,  Once, 7 of 8 cycles Administration: 0.25 mg (09/22/2018), 0.25 mg (10/11/2018), 0.25 mg (10/25/2018), 0.25 mg (12/01/2018), 0.25 mg (12/15/2018), 0.25 mg (11/17/2018), 0.25 mg (12/29/2018) pegfilgrastim (NEULASTA) injection 6 mg, 6 mg, Subcutaneous, Once, 5 of 5 cycles Administration: 6 mg (10/13/2018), 6 mg (10/27/2018), 6 mg (12/03/2018),  6 mg (12/17/2018), 6 mg (11/19/2018) pegfilgrastim-cbqv (UDENYCA) injection 6 mg, 6 mg, Subcutaneous, Once, 1 of 2 cycles Administration: 6 mg (12/31/2018) leucovorin 628 mg in dextrose 5 % 250 mL infusion, 400 mg/m2 =  628 mg, Intravenous,  Once, 7 of 8 cycles Administration: 628 mg (09/22/2018), 628 mg (10/11/2018), 628 mg (10/25/2018), 628 mg (12/01/2018), 628 mg (12/15/2018), 628 mg (11/17/2018), 628 mg (12/29/2018) oxaliplatin (ELOXATIN) 100 mg in dextrose 5 % 500 mL chemo infusion, 65 mg/m2 = 100 mg (100 % of original dose 65 mg/m2), Intravenous,  Once, 7 of 8 cycles Dose modification: 65 mg/m2 (original dose 65 mg/m2, Cycle 1, Reason: Provider Judgment) Administration: 100 mg (09/22/2018), 100 mg (10/11/2018), 100 mg (10/25/2018), 100 mg (12/01/2018), 100 mg (12/15/2018), 100 mg (11/17/2018), 100 mg (12/29/2018) fluorouracil (ADRUCIL) chemo injection 500 mg, 320 mg/m2 = 500 mg (100 % of original dose 320 mg/m2), Intravenous,  Once, 7 of 8 cycles Dose modification: 320 mg/m2 (original dose 320 mg/m2, Cycle 1, Reason: Provider Judgment) Administration: 500 mg (09/22/2018), 500 mg (10/11/2018), 500 mg (10/25/2018), 500 mg (12/01/2018), 500 mg (12/15/2018), 500 mg (11/17/2018), 500 mg (12/29/2018) fluorouracil (ADRUCIL) 3,000 mg in sodium chloride 0.9 % 90 mL chemo infusion, 1,920 mg/m2 = 3,000 mg (100 % of original dose 1,920 mg/m2), Intravenous, 1 Day/Dose, 7 of 8 cycles Dose modification: 1,920 mg/m2 (original dose 1,920 mg/m2, Cycle 1, Reason: Provider Judgment) Administration: 3,000 mg (09/22/2018), 3,000 mg (10/11/2018), 3,000 mg (10/25/2018), 3,000 mg (12/01/2018), 3,000 mg (12/15/2018), 3,000 mg (11/17/2018), 3,000 mg (12/29/2018)  for chemotherapy treatment.    12/24/2018 Genetic Testing   Negative genetic testing on the common hereditary cancer panel.  The Common Hereditary Gene Panel offered by [facility] includes sequencing and/or deletion duplication testing of the following 48 genes: APC, ATM, AXIN2, BARD1, BMPR1A, BRCA1, BRCA2, BRIP1, CDH1, CDK4, CDKN2A (p14ARF), CDKN2A (p16INK4a), CHEK2, CTNNA1, DICER1, EPCAM (Deletion/duplication testing only), GREM1 (promoter region deletion/duplication testing only), KIT, MEN1, MLH1, MSH2, MSH3, MSH6,  MUTYH, NBN, NF1, NHTL1, PALB2, PDGFRA, PMS2, POLD1, POLE, PTEN, RAD50, RAD51C, RAD51D, RNF43, SDHB, SDHC, SDHD, SMAD4, SMARCA4. STK11, TP53, TSC1, TSC2, and VHL.  The following genes were evaluated for sequence changes only: SDHA and HOXB13 c.251G>A variant only. The report date is December 24, 2018.  DICER1 VUS identified.  This will not change medical decision making.   02/10/2019 -  Chemotherapy   The patient had palonosetron (ALOXI) injection 0.25 mg, 0.25 mg, Intravenous,  Once, 8 of 9 cycles Administration: 0.25 mg (02/10/2019), 0.25 mg (02/24/2019), 0.25 mg (03/10/2019), 0.25 mg (03/17/2019), 0.25 mg (03/31/2019), 0.25 mg (04/14/2019), 0.25 mg (05/12/2019), 0.25 mg (05/30/2019), 0.25 mg (06/14/2019), 0.25 mg (06/28/2019), 0.25 mg (07/13/2019), 0.25 mg (08/04/2019), 0.25 mg (08/18/2019) pegfilgrastim-jmdb (FULPHILA) injection 6 mg, 6 mg, Subcutaneous,  Once, 7 of 8 cycles Administration: 6 mg (02/25/2019), 6 mg (03/18/2019), 6 mg (05/31/2019), 6 mg (07/14/2019), 6 mg (08/05/2019) pegfilgrastim-cbqv (UDENYCA) injection 6 mg, 6 mg, Subcutaneous, Once, 1 of 1 cycle Administration: 6 mg (06/29/2019) CISplatin (PLATINOL) 31 mg in sodium chloride 0.9 % 250 mL chemo infusion, 20 mg/m2 = 31 mg (80 % of original dose 25 mg/m2), Intravenous,  Once, 8 of 9 cycles Dose modification: 20 mg/m2 (80 % of original dose 25 mg/m2, Cycle 1, Reason: Other (see comments), Comment: renal dysfunction) Administration: 31 mg (02/10/2019), 31 mg (02/24/2019), 31 mg (03/10/2019), 31 mg (03/17/2019), 31 mg (03/31/2019), 31 mg (04/14/2019), 31 mg (05/30/2019), 31 mg (06/14/2019), 31 mg (06/28/2019), 31 mg (07/13/2019), 31 mg (08/04/2019), 31 mg (08/18/2019) gemcitabine (GEMZAR) 1,140  mg in sodium chloride 0.9 % 250 mL chemo infusion, 750 mg/m2 = 1,140 mg (75 % of original dose 1,000 mg/m2), Intravenous,  Once, 8 of 9 cycles Dose modification: 750 mg/m2 (75 % of original dose 1,000 mg/m2, Cycle 1, Reason: Provider Judgment) Administration: 1,140 mg  (02/10/2019), 1,140 mg (02/24/2019), 1,140 mg (03/10/2019), 1,140 mg (03/17/2019), 1,140 mg (03/31/2019), 1,140 mg (04/14/2019), 1,140 mg (05/12/2019), 1,140 mg (05/30/2019), 1,140 mg (06/14/2019), 1,140 mg (06/28/2019), 1,140 mg (07/13/2019), 1,140 mg (08/04/2019), 1,140 mg (08/18/2019) fosaprepitant (EMEND) 150 mg, dexamethasone (DECADRON) 12 mg in sodium chloride 0.9 % 145 mL IVPB, , Intravenous,  Once, 8 of 9 cycles Administration:  (02/10/2019),  (02/24/2019),  (03/10/2019),  (03/17/2019),  (03/31/2019),  (04/14/2019),  (05/12/2019),  (05/30/2019),  (06/14/2019),  (06/28/2019),  (07/13/2019),  (08/04/2019),  (08/18/2019)  for chemotherapy treatment.       CANCER STAGING: Cancer Staging Pancreatic cancer metastasized to liver [facility]) Staging form: Pancreas, AJCC 7th Edition - Clinical: Stage IV (T3, N1, M1) - Signed by Baird Cancer, PA-C on 03/16/2014 - Pathologic: No stage assigned - Unsigned    INTERVAL HISTORY:  Ms. Lemke 86 y.o. female seen for follow-up and toxicity assessment prior to cycle 7-day 15 chemotherapy today.  Reports appetite and energy levels of 50%.  Right shoulder pain reported as 5 out of 10.  Denies any tingling or numbness in the extremities.  Denies any fevers.  Felt tired for 2 days after last treatment.  Had gained 1 pound since last treatment 2 weeks ago.  Reports that Ditropan helped with her urinary symptoms.  REVIEW OF SYSTEMS:  Review of Systems  Constitutional: Positive for fatigue.  Respiratory: Positive for cough.   Gastrointestinal: Positive for constipation and diarrhea.  All other systems reviewed and are negative.    PAST MEDICAL/SURGICAL HISTORY:  Past Medical History:  Diagnosis Date  . Anemia due to antineoplastic chemotherapy 09/12/2015   Started Aranesp 500 mcg on 09/12/2015  . Anxiety   . Chronic diarrhea   . Depression   . DNR (do not resuscitate) 08/16/2015  . Erosive esophagitis   . Family history of breast cancer in female   . Family history of prostate  cancer   . GERD (gastroesophageal reflux disease)   . Hx of adenomatous colonic polyps    tubular adenomas, last found in 2008  . Hyperplastic colon polyp 03/19/10   tcs by Dr. Gala Romney  . Hypertension 20 years   . Kidney stone    hx/ crushed   . Opioid contract exists 04/18/2015   With Dr. Moshe Cipro  . Pancreatic cancer ([place]) 02/2014  . Pancreatic cancer metastasized to liver ([place]) 03/10/2014  . Schatzki's ring 08/26/10   Last dilated on EGD by Dr. Trevor Iha [facility], linear gastric erosions, BI hemigastrectomy   Past Surgical History:  Procedure Laterality Date  . BREAST LUMPECTOMY Left   . bunion removal     from both feet   . CHOLECYSTECTOMY  1965   . COLONOSCOPY  03/19/2010   DR Gala Romney,, normal TI, pancolonic diverticula, random colon bx neg., hyperplastic polyps removed  . ESOPHAGOGASTRODUODENOSCOPY  11/22/2003   DR Gala Romney, erosive RE, Billroth I  . ESOPHAGOGASTRODUODENOSCOPY  08/26/10   Dr. Gala Romney- moderate severe ERE, Scahtzki ring s/p dilation, Billroth I, linear gastric erosions, bx-gastric xanthelasma  . LEFT SHOULDER SURGERY  2009   DR HARRISON  . ORIF ANKLE FRACTURE Right 05/22/2013   Procedure: OPEN REDUCTION INTERNAL FIXATION (ORIF) RIGHT ANKLE FRACTURE;  Surgeon: Sanjuana Kava, MD;  Location: [facility];  Service: Orthopedics;  Laterality: Right;  . stomach ulcer  50 years ago    had some of her stomach removed      SOCIAL HISTORY:  Social History   Socioeconomic History  . Marital status: Widowed    Spouse name: Not on file  . Number of children: 2  . Years of education: 32  . Highest education level: 12th grade  Occupational History  . Occupation: retired from Estate manager/land agent: RETIRED  Tobacco Use  . Smoking status: Current Some Day Smoker    Packs/day: 0.50    Years: 60.00    Pack years: 30.00    Types: Cigarettes    Last attempt to quit: 09/09/2016    Years since quitting: 2.9  . Smokeless tobacco: Never Used  Substance and Sexual Activity  . Alcohol use: No   . Drug use: No  . Sexual activity: Not Currently  Other Topics Concern  . Not on file  Social History Narrative   Lives with son alone    Social Determinants of Health   Financial Resource Strain:   . Difficulty of Paying Living Expenses:   Food Insecurity:   . Worried About Charity fundraiser in the Last Year:   . Arboriculturist in the Last Year:   Transportation Needs:   . Film/video editor (Medical):   Marland Kitchen Lack of Transportation (Non-Medical):   Physical Activity:   . Days of Exercise per Week:   . Minutes of Exercise per Session:   Stress:   . Feeling of Stress :   Social Connections:   . Frequency of Communication with Friends and Family:   . Frequency of Social Gatherings with Friends and Family:   . Attends Religious Services:   . Active Member of Clubs or Organizations:   . Attends Archivist Meetings:   Marland Kitchen Marital Status:   Intimate Partner Violence:   . Fear of Current or Ex-Partner:   . Emotionally Abused:   Marland Kitchen Physically Abused:   . Sexually Abused:     FAMILY HISTORY:  Family History  Problem Relation Age of Onset  . Hypertension Mother   . Kidney failure Brother        on dialysis  . Diabetes Sister   . Diabetes Sister   . Hypertension Father   . Kidney failure Sister   . Kidney Stones Brother   . Breast cancer Son 62  . Gout Son 53  . Gout Nephew 57  . Liver disease Neg Hx   . Colon cancer Neg Hx     CURRENT MEDICATIONS:  Outpatient Encounter Medications as of 08/18/2019  Medication Sig  . acetaminophen (TYLENOL) 500 MG tablet Take 500 mg by mouth every 6 (six) hours as needed for mild pain or moderate pain.   Marland Kitchen amLODipine (NORVASC) 10 MG tablet TAKE 1 TABLET DAILY  . CISPLATIN IV Inject into the vein. Days 1, 8 every 21 days  . clobetasol (TEMOVATE) 0.05 % external solution APP AA ON SCALP BID PRN NOT TO FACE GROIN OR UNDERARMS  . clonazePAM (KLONOPIN) 0.5 MG tablet Take 1 tablet (0.5 mg total) by mouth at bedtime.  .  cycloSPORINE (RESTASIS) 0.05 % ophthalmic emulsion Place 1 drop into both eyes 2 (two) times daily.  . ergocalciferol (VITAMIN D2) 1.25 MG (50000 UT) capsule Take 1 capsule (50,000 Units total) by mouth once a week.  . ergocalciferol (VITAMIN D2) 1.25 MG (50000 UT) capsule Take 1  capsule (50,000 Units total) by mouth once a week.  . fluorometholone (FML) 0.1 % ophthalmic suspension INSTILL 1 DROP INTO EACH EYE THREE TIMES DAILY FOR 14 DAYS  . fluticasone (CUTIVATE) 0.05 % cream APPLY TO THE AFFECTED AREA FACE TWICE DAILY AS NEEDED  . fluticasone (FLONASE) 50 MCG/ACT nasal spray Place 1 spray into both nostrils daily.  Marland Kitchen GEMCITABINE HCL IV Inject into the vein. Days 1, 8 every 21 days  . lidocaine (XYLOCAINE) 2 % solution RINSE WITH 5ML AS NEEDED TO RELIEVE MOUTH PAIN.  Marland Kitchen lidocaine-prilocaine (EMLA) cream Apply 1 application topically as needed. Apply to portacath site as needed  . loperamide (IMODIUM A-D) 2 MG tablet Take 2 mg by mouth 4 (four) times daily as needed for diarrhea or loose stools.  . metoprolol tartrate (LOPRESSOR) 25 MG tablet TAKE 1 TABLET DAILY FOR BLOOD PRESSURE (DOSE REDUCTION)  . mirtazapine (REMERON) 15 MG tablet Take 15 mg by mouth at bedtime.  Marland Kitchen oxybutynin (DITROPAN-XL) 5 MG 24 hr tablet TAKE 1 TABLET(5 MG) BY MOUTH AT BEDTIME  . pantoprazole (PROTONIX) 20 MG tablet TAKE 1 TABLET DAILY  . senna-docusate (SENOKOT-S) 8.6-50 MG tablet Take 1 tablet by mouth daily.  . [DISCONTINUED] prochlorperazine (COMPAZINE) 10 MG tablet Take 1 tablet (10 mg total) by mouth every 6 (six) hours as needed (Nausea or vomiting).   No facility-administered encounter medications on file as of 08/18/2019.    ALLERGIES:  Allergies  Allergen Reactions  . Iohexol Hives and Shortness Of Breath    **Patient can not have IV contrast, pt has breakthrough reaction even after 13 hour premeds** Do not give IV contrast PATIENT HAD TO BE TAKEN TO ED, C/O WHELPS, HIVES, DIFFICULTY BREATHING. PATIENT GIVEN IV  CONTRAST AFTER 13 HOUR PRE MEDS ON 01/09/2017 WITH NO REACTION NOTED. On 10/19/17 patient had reaction despite premedication.    Marland Kitchen Aciphex [Rabeprazole Sodium] Other (See Comments)    unknown  . Amlodipine Besylate-Valsartan     Rash to high-dose (5/320)  . Esomeprazole Magnesium   . Omeprazole Other (See Comments)    Patient states "medication didn't work"  . Ciprofloxacin Rash  . Penicillins Swelling and Rash    Has patient had a PCN reaction causing immediate rash, facial/tongue/throat swelling, SOB or lightheadedness with hypotension: Yes Has patient had a PCN reaction causing severe rash involving mucus membranes or skin necrosis: Yes Has patient had a PCN reaction that required hospitalization: No Has patient had a PCN reaction occurring within the last 10 years: yes If all of the above answers are "NO", then may proceed with Cephalosporin use.   [name] [Benzonatate] Itching    Full head, face, neck, and chest itching.      PHYSICAL EXAM:  ECOG Performance status: 1  Vitals:   08/18/19 0753  BP: (!) 157/50  Pulse: (!) 57  Resp: 18  Temp: (!) 96.7 F (35.9 C)  SpO2: 99%   Filed Weights   08/18/19 0753  Weight: 111 lb 12.8 oz (50.7 kg)    Physical Exam Vitals reviewed.  Constitutional:      Appearance: Normal appearance.  Cardiovascular:     Rate and Rhythm: Normal rate and regular rhythm.     Heart sounds: Normal heart sounds.  Pulmonary:     Effort: Pulmonary effort is normal.     Breath sounds: Normal breath sounds.  Abdominal:     General: There is no distension.     Palpations: Abdomen is soft. There is no mass.  Musculoskeletal:        General: No swelling.  Skin:    General: Skin is warm.  Neurological:     General: No focal deficit present.     Mental Status: She is alert and oriented to person, place, and time.  Psychiatric:        Mood and Affect: Mood normal.        Behavior: Behavior normal.      LABORATORY DATA:  I have  reviewed the labs as listed.  CBC    Component Value Date/Time   WBC 10.5 08/18/2019 0757   RBC 2.38 (L) 08/18/2019 0757   HGB 7.3 (L) 08/18/2019 0757   HCT 22.8 (L) 08/18/2019 0757   PLT 105 (L) 08/18/2019 0757   MCV 95.8 08/18/2019 0757   MCH 30.7 08/18/2019 0757   MCHC 32.0 08/18/2019 0757   RDW 17.6 (H) 08/18/2019 0757   LYMPHSABS 1.1 08/18/2019 0757   MONOABS 0.5 08/18/2019 0757   EOSABS 0.2 08/18/2019 0757   BASOSABS 0.0 08/18/2019 0757   CMP Latest Ref Rng & Units 08/18/2019 08/04/2019 07/27/2019  Glucose 70 - 99 mg/dL 100(H) 112(H) 106(H)  BUN 8 - 23 mg/dL 25(H) 23 25(H)  Creatinine 0.44 - 1.00 mg/dL 1.36(H) 1.38(H) 1.53(H)  Sodium 135 - 145 mmol/L 139 138 139  Potassium 3.5 - 5.1 mmol/L 3.9 4.0 4.1  Chloride 98 - 111 mmol/L 108 105 106  CO2 22 - 32 mmol/L _0 Calcium 8.9 - 10.3 mg/dL 8.0(L) 8.0(L) 8.0(L)  Total Protein 6.5 - 8.1 g/dL 6.9 7.0 7.2  Total Bilirubin 0.3 - 1.2 mg/dL 0.7 0.4 0.3  Alkaline Phos 38 - 126 U/L 202(H) 209(H) 226(H)  AST 15 - 41 U/L _1 ALT 0 - 44 U/L _2 DIAGNOSTIC IMAGING:  I have reviewed her scans.   I have reviewed Venita Lick LPN's note and agree with the documentation.  I personally performed a face-to-face visit, made revisions and my assessment and plan is as follows.    ASSESSMENT & PLAN:   Pancreatic cancer metastasized to liver ([place]) 1.  Metastatic pancreatic cancer to the liver: -7 cycles of gemcitabine and cisplatin day 1 and day 15 from 02/10/2019 through 08/04/2019. -CT CAP on 06/01/2019 showed stable right hepatic lesion.  Segmental liver lesion slightly bigger.  Left and right omental nodules increased by 5 mm.  Study limited by lack of IV contrast. -We reviewed her labs today.  Creatinine is 1.36.  Hemoglobin is 7.3.  LFTs are normal.  She gained 1 pound. -She will proceed with cycle 7-day 15 today. -She will come back tomorrow for IV hydration.  We will also give her 1 unit of PRBC  tomorrow. -She will come back in 2 weeks for follow-up with [place].  We will plan to obtain CT CAP without contrast.  2.  Renal insufficiency: -Creatinine is 1.36 today.  She will proceed with treatment today.  She will be back tomorrow for IV hydration.  3.  Vitamin D deficiency: -She will continue vitamin D 50,000 units daily.  4.  Overactive bladder: -She is taking Ditropan XL 5 mg at night.  She reports improvement in urgency.     Orders placed this encounter:  Orders Placed This Encounter  Procedures  . CT Abdomen Pelvis Wo Contrast  . CT Chest Wo Contrast  . CBC with Differential/Platelet  . Comprehensive metabolic panel  . Lactate dehydrogenase  . Magnesium  .  Vitamin D 25 hydroxy  . Informed Consent Details: Physician/Practitioner Attestation; Transcribe to consent form and obtain patient signature     Derek Jack, [place] [phone]

## 2019-08-18 NOTE — Patient Instructions (Signed)
[facility] Discharge Instructions for Patients Receiving Chemotherapy  Today you received the following chemotherapy agents   To help prevent nausea and vomiting after your treatment, we encourage you to take your nausea medication   If you develop nausea and vomiting that is not controlled by your nausea medication, call the clinic.   BELOW ARE SYMPTOMS THAT SHOULD BE REPORTED IMMEDIATELY:  *FEVER GREATER THAN 100.5 F  *CHILLS WITH OR WITHOUT FEVER  NAUSEA AND VOMITING THAT IS NOT CONTROLLED WITH YOUR NAUSEA MEDICATION  *UNUSUAL SHORTNESS OF BREATH  *UNUSUAL BRUISING OR BLEEDING  TENDERNESS IN MOUTH AND THROAT WITH OR WITHOUT PRESENCE OF ULCERS  *URINARY PROBLEMS  *BOWEL PROBLEMS  UNUSUAL RASH Items with * indicate a potential emergency and should be followed up as soon as possible.  Feel free to call the clinic should you have any questions or concerns. The clinic phone number is [phone].  Please show the CHEMO ALERT CARD at check-in to the Emergency Department and triage nurse.   

## 2019-08-18 NOTE — Progress Notes (Signed)
Patient has been assessed, vital signs and labs have been reviewed by Dr. Katragadda. ANC, Creatinine, LFTs, and Platelets are within treatment parameters per Dr. Katragadda. The patient is good to proceed with treatment at this time.  

## 2019-08-18 NOTE — Patient Instructions (Signed)
[place] at [facility] Discharge Instructions  You were seen today by Dr. Delton Coombes. He went over your recent lab results. You will receive treatment today.  Dr. Delton Coombes wants you to return tomorrow for IV fluids and a blood transfusion.  He will see you back in two weeks for labs and follow up.   Thank you for choosing [place] at [facility] to provide your oncology and hematology care.  To afford each patient quality time with our provider, please arrive at least 15 minutes before your scheduled appointment time.   If you have a lab appointment with the [place] please come in thru the  Main Entrance and check in at the main information desk  You need to re-schedule your appointment should you arrive 10 or more minutes late.  We strive to give you quality time with our providers, and arriving late affects you and other patients whose appointments are after yours.  Also, if you no show three or more times for appointments you may be dismissed from the clinic at the providers discretion.     Again, thank you for choosing [facility].  Our hope is that these requests will decrease the amount of time that you wait before being seen by our physicians.       _____________________________________________________________  Should you have questions after your visit to [facility], please contact our office at [phone] between the hours of 8:00 a.m. and 4:30 p.m.  Voicemails left after 4:00 p.m. will not be returned until the following business day.  For prescription refill requests, have your pharmacy contact our office and allow 72 hours.    Cancer Center Support Programs:   > Cancer Support Group  2nd Tuesday of the month 1pm-2pm, Journey Room

## 2019-08-18 NOTE — Assessment & Plan Note (Signed)
1.  Metastatic pancreatic cancer to the liver: -7 cycles of gemcitabine and cisplatin day 1 and day 15 from 02/10/2019 through 08/04/2019. -CT CAP on 06/01/2019 showed stable right hepatic lesion.  Segmental liver lesion slightly bigger.  Left and right omental nodules increased by 5 mm.  Study limited by lack of IV contrast. -We reviewed her labs today.  Creatinine is 1.36.  Hemoglobin is 7.3.  LFTs are normal.  She gained 1 pound. -She will proceed with cycle 7-day 15 today. -She will come back tomorrow for IV hydration.  We will also give her 1 unit of PRBC tomorrow. -She will come back in 2 weeks for follow-up with [place].  We will plan to obtain CT CAP without contrast.  2.  Renal insufficiency: -Creatinine is 1.36 today.  She will proceed with treatment today.  She will be back tomorrow for IV hydration.  3.  Vitamin D deficiency: -She will continue vitamin D 50,000 units daily.  4.  Overactive bladder: -She is taking Ditropan XL 5 mg at night.  She reports improvement in urgency.

## 2019-08-18 NOTE — Patient Instructions (Signed)
[facility] at [facility] Discharge Instructions  Labs drawn from portacath today   Thank you for choosing [facility] at [facility] to provide your oncology and hematology care.  To afford each patient quality time with our provider, please arrive at least 15 minutes before your scheduled appointment time.   If you have a lab appointment with the Cancer Center please come in thru the Main Entrance and check in at the main information desk.  You need to re-schedule your appointment should you arrive 10 or more minutes late.  We strive to give you quality time with our providers, and arriving late affects you and other patients whose appointments are after yours.  Also, if you no show three or more times for appointments you may be dismissed from the clinic at the providers discretion.     Again, thank you for choosing [facility].  Our hope is that these requests will decrease the amount of time that you wait before being seen by our physicians.       _____________________________________________________________  Should you have questions after your visit to [facility], please contact our office at [phone] between the hours of 8:00 a.m. and 4:30 p.m.  Voicemails left after 4:00 p.m. will not be returned until the following business day.  For prescription refill requests, have your pharmacy contact our office and allow 72 hours.    Due to Covid, you will need to wear a mask upon entering the hospital. If you do not have a mask, a mask will be given to you at the Main Entrance upon arrival. For doctor visits, patients may have 1 support person with them. For treatment visits, patients can not have anyone with them due to social distancing guidelines and our immunocompromised population.     

## 2019-08-19 ENCOUNTER — Encounter (HOSPITAL_COMMUNITY): Payer: Self-pay

## 2019-08-19 ENCOUNTER — Inpatient Hospital Stay (HOSPITAL_COMMUNITY): Payer: Medicare HMO

## 2019-08-19 VITALS — BP 137/54 | HR 62 | Temp 97.3°F | Resp 18

## 2019-08-19 DIAGNOSIS — D6481 Anemia due to antineoplastic chemotherapy: Secondary | ICD-10-CM

## 2019-08-19 DIAGNOSIS — C259 Malignant neoplasm of pancreas, unspecified: Secondary | ICD-10-CM

## 2019-08-19 DIAGNOSIS — Z5111 Encounter for antineoplastic chemotherapy: Secondary | ICD-10-CM | POA: Diagnosis not present

## 2019-08-19 DIAGNOSIS — C787 Secondary malignant neoplasm of liver and intrahepatic bile duct: Secondary | ICD-10-CM

## 2019-08-19 MED ORDER — PEGFILGRASTIM-JMDB 6 MG/0.6ML ~~LOC~~ SOSY
6.0000 mg | PREFILLED_SYRINGE | Freq: Once | SUBCUTANEOUS | Status: AC
  Administered 2019-08-19: 6 mg via SUBCUTANEOUS
  Filled 2019-08-19: qty 0.6

## 2019-08-19 MED ORDER — DIPHENHYDRAMINE HCL 25 MG PO CAPS
25.0000 mg | ORAL_CAPSULE | Freq: Once | ORAL | Status: AC
  Administered 2019-08-19: 25 mg via ORAL
  Filled 2019-08-19: qty 1

## 2019-08-19 MED ORDER — SODIUM CHLORIDE 0.9% IV SOLUTION
250.0000 mL | Freq: Once | INTRAVENOUS | Status: AC
  Administered 2019-08-19: 250 mL via INTRAVENOUS

## 2019-08-19 MED ORDER — SODIUM CHLORIDE 0.9% FLUSH
10.0000 mL | INTRAVENOUS | Status: AC | PRN
  Administered 2019-08-19: 10 mL

## 2019-08-19 MED ORDER — ACETAMINOPHEN 325 MG PO TABS
650.0000 mg | ORAL_TABLET | Freq: Once | ORAL | Status: AC
  Administered 2019-08-19: 650 mg via ORAL
  Filled 2019-08-19: qty 2

## 2019-08-19 MED ORDER — SODIUM CHLORIDE 0.9 % IV SOLN
Freq: Once | INTRAVENOUS | Status: AC
  Filled 2019-08-19: qty 1000

## 2019-08-19 MED ORDER — SODIUM CHLORIDE 0.9% FLUSH
10.0000 mL | Freq: Once | INTRAVENOUS | Status: AC | PRN
  Administered 2019-08-19: 10 mL

## 2019-08-19 MED ORDER — HEPARIN SOD (PORK) LOCK FLUSH 100 UNIT/ML IV SOLN
500.0000 [IU] | Freq: Once | INTRAVENOUS | Status: AC | PRN
  Administered 2019-08-19: 500 [IU]

## 2019-08-19 NOTE — Patient Instructions (Signed)
[place] at [facility] Discharge Instructions  Received 1 unit of blood and IV hydration with magnesium and potassium as well as Fulphila injection today. Follow-up as scheduled. Call clinic for any questions or concerns   Thank you for choosing [place] at [facility] to provide your oncology and hematology care.  To afford each patient quality time with our provider, please arrive at least 15 minutes before your scheduled appointment time.   If you have a lab appointment with the [place] please come in thru the Main Entrance and check in at the main information desk.  You need to re-schedule your appointment should you arrive 10 or more minutes late.  We strive to give you quality time with our providers, and arriving late affects you and other patients whose appointments are after yours.  Also, if you no show three or more times for appointments you may be dismissed from the clinic at the providers discretion.     Again, thank you for choosing [facility].  Our hope is that these requests will decrease the amount of time that you wait before being seen by our physicians.       _____________________________________________________________  Should you have questions after your visit to [facility], please contact our office at [phone] between the hours of 8:00 a.m. and 4:30 p.m.  Voicemails left after 4:00 p.m. will not be returned until the following business day.  For prescription refill requests, have your pharmacy contact our office and allow 72 hours.    Due to Covid, you will need to wear a mask upon entering the hospital. If you do not have a mask, a mask will be given to you at the Main Entrance upon arrival. For doctor visits, patients may have 1 support person with them. For treatment visits, patients can not have anyone with them due to social distancing guidelines and our immunocompromised  population.

## 2019-08-19 NOTE — Progress Notes (Signed)
One unit of blood given today per orders, hydration fluids and injection given per orders.  Patient tolerated it well without problems. Vitals stable and discharged home from clinic ambulatory. Follow up as scheduled.

## 2019-08-20 LAB — TYPE AND SCREEN
ABO/RH(D): O POS
Antibody Screen: NEGATIVE
Unit division: 0

## 2019-08-20 LAB — BPAM RBC
Blood Product Expiration Date: 202105082359
ISSUE DATE / TIME: 202104091133
Unit Type and Rh: 5100

## 2019-08-29 NOTE — Progress Notes (Signed)

## 2019-09-01 ENCOUNTER — Encounter (HOSPITAL_COMMUNITY): Payer: Self-pay

## 2019-09-01 ENCOUNTER — Inpatient Hospital Stay (HOSPITAL_BASED_OUTPATIENT_CLINIC_OR_DEPARTMENT_OTHER): Payer: Medicare HMO | Admitting: Hematology

## 2019-09-01 ENCOUNTER — Encounter (HOSPITAL_COMMUNITY): Payer: Self-pay | Admitting: Hematology

## 2019-09-01 ENCOUNTER — Other Ambulatory Visit: Payer: Self-pay

## 2019-09-01 ENCOUNTER — Inpatient Hospital Stay (HOSPITAL_COMMUNITY): Payer: Medicare HMO

## 2019-09-01 VITALS — BP 162/58 | HR 68 | Temp 97.3°F | Resp 16

## 2019-09-01 DIAGNOSIS — D6481 Anemia due to antineoplastic chemotherapy: Secondary | ICD-10-CM

## 2019-09-01 DIAGNOSIS — Z5111 Encounter for antineoplastic chemotherapy: Secondary | ICD-10-CM | POA: Diagnosis not present

## 2019-09-01 DIAGNOSIS — C259 Malignant neoplasm of pancreas, unspecified: Secondary | ICD-10-CM

## 2019-09-01 DIAGNOSIS — C787 Secondary malignant neoplasm of liver and intrahepatic bile duct: Secondary | ICD-10-CM | POA: Diagnosis not present

## 2019-09-01 LAB — COMPREHENSIVE METABOLIC PANEL
ALT: 11 U/L (ref 0–44)
AST: 16 U/L (ref 15–41)
Albumin: 3.4 g/dL — ABNORMAL LOW (ref 3.5–5.0)
Alkaline Phosphatase: 212 U/L — ABNORMAL HIGH (ref 38–126)
Anion gap: 10 (ref 5–15)
BUN: 22 mg/dL (ref 8–23)
CO2: 24 mmol/L (ref 22–32)
Calcium: 8.1 mg/dL — ABNORMAL LOW (ref 8.9–10.3)
Chloride: 106 mmol/L (ref 98–111)
Creatinine, Ser: 1.43 mg/dL — ABNORMAL HIGH (ref 0.44–1.00)
GFR calc Af Amer: 38 mL/min — ABNORMAL LOW (ref >60)
GFR calc non Af Amer: 33 mL/min — ABNORMAL LOW (ref >60)
Glucose, Bld: 105 mg/dL — ABNORMAL HIGH (ref 70–99)
Potassium: 3.6 mmol/L (ref 3.5–5.1)
Sodium: 140 mmol/L (ref 135–145)
Total Bilirubin: 0.5 mg/dL (ref 0.3–1.2)
Total Protein: 7.2 g/dL (ref 6.5–8.1)

## 2019-09-01 LAB — CBC WITH DIFFERENTIAL/PLATELET
Abs Immature Granulocytes: 0.07 10*3/uL (ref 0.00–0.07)
Basophils Absolute: 0 10*3/uL (ref 0.0–0.1)
Basophils Relative: 0 %
Eosinophils Absolute: 0.1 10*3/uL (ref 0.0–0.5)
Eosinophils Relative: 1 %
HCT: 27.6 % — ABNORMAL LOW (ref 36.0–46.0)
Hemoglobin: 8.9 g/dL — ABNORMAL LOW (ref 12.0–15.0)
Immature Granulocytes: 1 %
Lymphocytes Relative: 10 %
Lymphs Abs: 1.2 10*3/uL (ref 0.7–4.0)
MCH: 30.7 pg (ref 26.0–34.0)
MCHC: 32.2 g/dL (ref 30.0–36.0)
MCV: 95.2 fL (ref 80.0–100.0)
Monocytes Absolute: 0.9 10*3/uL (ref 0.1–1.0)
Monocytes Relative: 7 %
Neutro Abs: 9.5 10*3/uL — ABNORMAL HIGH (ref 1.7–7.7)
Neutrophils Relative %: 81 %
Platelets: 112 10*3/uL — ABNORMAL LOW (ref 150–400)
RBC: 2.9 MIL/uL — ABNORMAL LOW (ref 3.87–5.11)
RDW: 16.6 % — ABNORMAL HIGH (ref 11.5–15.5)
WBC: 11.8 10*3/uL — ABNORMAL HIGH (ref 4.0–10.5)
nRBC: 0 % (ref 0.0–0.2)

## 2019-09-01 LAB — LACTATE DEHYDROGENASE: LDH: 131 U/L (ref 98–192)

## 2019-09-01 LAB — MAGNESIUM: Magnesium: 1.6 mg/dL — ABNORMAL LOW (ref 1.7–2.4)

## 2019-09-01 LAB — VITAMIN D 25 HYDROXY (VIT D DEFICIENCY, FRACTURES): Vit D, 25-Hydroxy: 49.67 ng/mL (ref 30–100)

## 2019-09-01 MED ORDER — HEPARIN SOD (PORK) LOCK FLUSH 100 UNIT/ML IV SOLN
500.0000 [IU] | Freq: Once | INTRAVENOUS | Status: AC | PRN
  Administered 2019-09-01: 500 [IU]

## 2019-09-01 MED ORDER — SODIUM CHLORIDE 0.9% FLUSH
10.0000 mL | Freq: Once | INTRAVENOUS | Status: AC | PRN
  Administered 2019-09-01: 10 mL

## 2019-09-01 MED ORDER — SODIUM CHLORIDE 0.9 % IV SOLN
Freq: Once | INTRAVENOUS | Status: AC
  Filled 2019-09-01: qty 1000

## 2019-09-01 NOTE — Patient Instructions (Signed)
[facility] at [facility]  Discharge Instructions:   _______________________________________________________________  Thank you for choosing [facility] at [facility] to provide your oncology and hematology care.  To afford each patient quality time with our providers, please arrive at least 15 minutes before your scheduled appointment.  You need to re-schedule your appointment if you arrive 10 or more minutes late.  We strive to give you quality time with our providers, and arriving late affects you and other patients whose appointments are after yours.  Also, if you no show three or more times for appointments you may be dismissed from the clinic.  Again, thank you for choosing [facility] at [facility]. Our hope is that these requests will allow you access to exceptional care and in a timely manner. _______________________________________________________________  If you have questions after your visit, please contact our office at [phone] between the hours of 8:30 a.m. and 5:00 p.m. Voicemails left after 4:30 p.m. will not be returned until the following business day. _______________________________________________________________  For prescription refill requests, have your pharmacy contact our office. _______________________________________________________________  Recommendations made by the consultant and any test results will be sent to your referring physician. _______________________________________________________________ 

## 2019-09-01 NOTE — Progress Notes (Signed)
[place], [place] [address]   CLINIC:  Medical Oncology/Hematology  PCP:  Fayrene Helper, MD [address], [place] [address] [phone]   REASON FOR VISIT:  Follow-up for metastatic pancreatic cancer to the liver   BRIEF ONCOLOGIC HISTORY:  Oncology History  Pancreatic cancer metastasized to liver ([place])  03/10/2014 Initial Diagnosis   Pancreatic cancer metastasized to liver   03/22/2014 - 03/05/2016 Chemotherapy   Abraxane/Gemzar days 1, 8, every 28 days.  Day 15 was cancelled due to leukopenia and thrombocytopenia on day 15 cycle 1.   05/24/2014 Treatment Plan Change   Day 8 of cycle 3 is held with ANC of 1.1   06/05/2014 Imaging   CT C/A/P . Interval decrease in size of the pancreatic tail mass.Improved hepatic metastatic disease. No new lesions. No CT findings for metastatic disease involving the chest.   08/09/2014 Tumor Marker   CA 19-9= 33 (WNL)   10/26/2014 Imaging   MRI- Continued interval decrease in size of the hepatic metastatic lesions and no new lesions are identified. Continued decrease in size of the pancreatic tail lesion.   01/03/2015 Tumor Marker   Results for [name], [name] (MRN [id number]) as of 01/18/2015 12:52  01/03/2015 10:00 CA 19-9: 14    01/17/2015 Imaging   MRI- Response to therapy of hepatic metastasis.  Similar size of a pancreatic tail lesion.   03/20/2015 Imaging   MRI-L spine- Severe disc space narrowing at L2-L3, with endplate reactive changes. Large disc extrusion into the ventral epidural space,central to the RIGHT with a cephalad migrated free fragment. Additional Large disc extrusion into the retroperitone...   03/22/2015 Imaging   CT pelvis- No evidence for metastatic disease within the pelvis.   07/24/2015 Imaging   MRI abd- Continued response to therapy, with no residual detectable liver metastases. No new sites of metastatic disease in the abdomen.    08/15/2015 Code Status      She confirms desire for DNR status.   12/21/2015 Imaging   MRI liver- Severe image degradation due to motion artifact reducing diagnostic sensitivity and specificity. Reduce conspicuity of the pancreatic tail lesion suggesting further improvement. The original liver lesions have resolved.   03/05/2016 Treatment Plan Change   Chemotherapy holiday/Break after 2 years worth of treatment   04/07/2016 Imaging   CT chest- When compared to recent chest CT, new minimally displaced anterior left sixth rib fracture. Slight increase in subcarinal adenopathy   04/28/2016 Imaging   Bone density- BMD as determined from Femur Neck Right is 0.705 g/cm2 with a T-Score of -2.4. This patient is considered osteopenic according to [facility]) criteria. Compared with the prior study on 02/23/2013, the BMD of the lumbar spine/rt. femoral neck show a statistically significant decrease.    05/23/2016 Imaging   MRI abd- 1. Exam is significantly degraded by patient respiratory motion. Consider follow-up exams with CT abdomen with without contrast per pancreatic protocol. 2. Fullness in tail of pancreas is more prominent and potentially increased in size. Recommend close attention on follow-up. (Consider CT as above) 3. No explanation for back pain.   09/11/2016 Imaging   MRI pancreas- Interval progression of pancreatic tail lesion. New differential perfusion right liver with areas of heterogeneity. Underlying metastatic disease in this region not excluded.   09/11/2016 Progression   MRi in conjunction with rising CA 19-9 are indicative of relapse of disease.   01/09/2017 Progression   CT C/A/P: 2.1 x  2.9 cm lesion in the pancreatic tail and abutting the splenic hilum, corresponding to known primary pancreatic neoplasm, mildly increased.  Scattered small hypoenhancing lesions in the liver measuring up to 10 mm, approximately 8-10 in number, suspicious for hepatic metastases.  No  findings specific for metastatic disease in the chest.  Additional ancillary findings as above.    01/27/2017 - 10/20/2017 Chemotherapy   The patient had gemcitabine (GEMZAR) 1,292 mg in sodium chloride 0.9 % 250 mL chemo infusion, 800 mg/m2 = 1,292 mg, Intravenous,  Once, 9 of 12 cycles Administration: 1,292 mg (01/28/2017), 1,292 mg (02/11/2017), 1,292 mg (02/25/2017), 1,292 mg (03/11/2017), 1,292 mg (03/25/2017), 1,292 mg (04/08/2017), 1,292 mg (04/22/2017), 1,292 mg (04/29/2017), 1,292 mg (05/20/2017), 1,292 mg (06/03/2017), 1,292 mg (06/24/2017), 1,292 mg (07/08/2017), 1,292 mg (07/29/2017), 1,292 mg (08/12/2017), 1,292 mg (08/26/2017), 1,292 mg (09/09/2017), 1,292 mg (09/23/2017), 1,292 mg (10/07/2017)  for chemotherapy treatment.    10/21/2017 - 09/22/2018 Chemotherapy   The patient had leucovorin 700 mg in dextrose 5 % 250 mL infusion, 644 mg, Intravenous,  Once, 22 of 22 cycles Administration: 700 mg (10/21/2017), 700 mg (11/04/2017), 700 mg (11/18/2017), 700 mg (12/02/2017), 700 mg (12/16/2017), 700 mg (12/30/2017), 700 mg (01/13/2018), 700 mg (01/27/2018), 700 mg (02/10/2018), 700 mg (02/24/2018), 700 mg (03/10/2018), 700 mg (03/24/2018), 700 mg (04/28/2018), 700 mg (05/17/2018), 700 mg (05/31/2018), 700 mg (06/16/2018), 700 mg (06/30/2018), 700 mg (07/21/2018), 700 mg (08/04/2018), 700 mg (08/25/2018), 700 mg (09/08/2018) ondansetron (ZOFRAN) 8 mg in sodium chloride 0.9 % 50 mL IVPB, , Intravenous,  Once, 21 of 21 cycles Administration:  (11/04/2017),  (01/27/2018),  (02/10/2018),  (02/24/2018),  (03/10/2018),  (03/24/2018),  (04/28/2018),  (05/17/2018),  (05/31/2018),  (06/16/2018),  (06/30/2018),  (07/21/2018),  (08/04/2018),  (08/25/2018),  (09/08/2018) fluorouracil (ADRUCIL) 3,850 mg in sodium chloride 0.9 % 73 mL chemo infusion, 2,400 mg/m2 = 3,850 mg, Intravenous, 1 Day/Dose, 22 of 22 cycles Dose modification: 1,920 mg/m2 (original dose 2,400 mg/m2, Cycle 2, Reason: Provider Judgment, Comment: mucisitis) Administration: 3,850 mg  (10/21/2017), 3,100 mg (11/04/2017), 3,100 mg (11/18/2017), 3,100 mg (12/02/2017), 3,100 mg (12/16/2017), 3,100 mg (12/30/2017), 3,100 mg (01/13/2018), 3,100 mg (01/27/2018), 3,100 mg (02/10/2018), 3,100 mg (02/24/2018), 3,100 mg (03/10/2018), 3,100 mg (03/24/2018), 3,100 mg (04/28/2018), 3,100 mg (05/17/2018), 3,100 mg (05/31/2018), 3,100 mg (06/16/2018), 3,100 mg (06/30/2018), 3,100 mg (07/21/2018), 3,100 mg (08/04/2018), 3,100 mg (08/25/2018), 3,100 mg (09/08/2018) irinotecan LIPOSOME (ONIVYDE) 81.7 mg in sodium chloride 0.9 % 500 mL chemo infusion, 50 mg/m2 = 81.7 mg (100 % of original dose 50 mg/m2), Intravenous, Once, 22 of 22 cycles Dose modification: 50 mg/m2 (original dose 50 mg/m2, Cycle 1, Reason: Provider Judgment), 50 mg/m2 (original dose 50 mg/m2, Cycle 2, Reason: Provider Judgment) Administration: 81.7 mg (10/21/2017), 81.7 mg (11/04/2017), 81.7 mg (11/18/2017), 81.7 mg (12/02/2017), 81.7 mg (12/16/2017), 81.7 mg (12/30/2017), 81.7 mg (01/13/2018), 81.7 mg (01/27/2018), 81.7 mg (02/10/2018), 81.7 mg (02/24/2018), 81.7 mg (03/10/2018), 81.7 mg (03/24/2018), 81.7 mg (04/28/2018), 81.7 mg (05/17/2018), 81.7 mg (05/31/2018), 81.7 mg (06/16/2018), 81.7 mg (06/30/2018), 81.7 mg (07/21/2018), 81.7 mg (08/04/2018), 81.7 mg (08/25/2018), 81.7 mg (09/08/2018)  for chemotherapy treatment.    09/22/2018 - 01/11/2019 Chemotherapy   The patient had palonosetron (ALOXI) injection 0.25 mg, 0.25 mg, Intravenous,  Once, 7 of 8 cycles Administration: 0.25 mg (09/22/2018), 0.25 mg (10/11/2018), 0.25 mg (10/25/2018), 0.25 mg (12/01/2018), 0.25 mg (12/15/2018), 0.25 mg (11/17/2018), 0.25 mg (12/29/2018) pegfilgrastim (NEULASTA) injection 6 mg, 6 mg, Subcutaneous, Once, 5 of 5 cycles Administration: 6 mg (10/13/2018), 6 mg (10/27/2018), 6 mg (12/03/2018),  6 mg (12/17/2018), 6 mg (11/19/2018) pegfilgrastim-cbqv (UDENYCA) injection 6 mg, 6 mg, Subcutaneous, Once, 1 of 2 cycles Administration: 6 mg (12/31/2018) leucovorin 628 mg in dextrose 5 % 250 mL infusion, 400 mg/m2 =  628 mg, Intravenous,  Once, 7 of 8 cycles Administration: 628 mg (09/22/2018), 628 mg (10/11/2018), 628 mg (10/25/2018), 628 mg (12/01/2018), 628 mg (12/15/2018), 628 mg (11/17/2018), 628 mg (12/29/2018) oxaliplatin (ELOXATIN) 100 mg in dextrose 5 % 500 mL chemo infusion, 65 mg/m2 = 100 mg (100 % of original dose 65 mg/m2), Intravenous,  Once, 7 of 8 cycles Dose modification: 65 mg/m2 (original dose 65 mg/m2, Cycle 1, Reason: Provider Judgment) Administration: 100 mg (09/22/2018), 100 mg (10/11/2018), 100 mg (10/25/2018), 100 mg (12/01/2018), 100 mg (12/15/2018), 100 mg (11/17/2018), 100 mg (12/29/2018) fluorouracil (ADRUCIL) chemo injection 500 mg, 320 mg/m2 = 500 mg (100 % of original dose 320 mg/m2), Intravenous,  Once, 7 of 8 cycles Dose modification: 320 mg/m2 (original dose 320 mg/m2, Cycle 1, Reason: Provider Judgment) Administration: 500 mg (09/22/2018), 500 mg (10/11/2018), 500 mg (10/25/2018), 500 mg (12/01/2018), 500 mg (12/15/2018), 500 mg (11/17/2018), 500 mg (12/29/2018) fluorouracil (ADRUCIL) 3,000 mg in sodium chloride 0.9 % 90 mL chemo infusion, 1,920 mg/m2 = 3,000 mg (100 % of original dose 1,920 mg/m2), Intravenous, 1 Day/Dose, 7 of 8 cycles Dose modification: 1,920 mg/m2 (original dose 1,920 mg/m2, Cycle 1, Reason: Provider Judgment) Administration: 3,000 mg (09/22/2018), 3,000 mg (10/11/2018), 3,000 mg (10/25/2018), 3,000 mg (12/01/2018), 3,000 mg (12/15/2018), 3,000 mg (11/17/2018), 3,000 mg (12/29/2018)  for chemotherapy treatment.    12/24/2018 Genetic Testing   Negative genetic testing on the common hereditary cancer panel.  The Common Hereditary Gene Panel offered by [facility] includes sequencing and/or deletion duplication testing of the following 48 genes: APC, ATM, AXIN2, BARD1, BMPR1A, BRCA1, BRCA2, BRIP1, CDH1, CDK4, CDKN2A (p14ARF), CDKN2A (p16INK4a), CHEK2, CTNNA1, DICER1, EPCAM (Deletion/duplication testing only), GREM1 (promoter region deletion/duplication testing only), KIT, MEN1, MLH1, MSH2, MSH3, MSH6,  MUTYH, NBN, NF1, NHTL1, PALB2, PDGFRA, PMS2, POLD1, POLE, PTEN, RAD50, RAD51C, RAD51D, RNF43, SDHB, SDHC, SDHD, SMAD4, SMARCA4. STK11, TP53, TSC1, TSC2, and VHL.  The following genes were evaluated for sequence changes only: SDHA and HOXB13 c.251G>A variant only. The report date is December 24, 2018.  DICER1 VUS identified.  This will not change medical decision making.   02/10/2019 -  Chemotherapy   The patient had palonosetron (ALOXI) injection 0.25 mg, 0.25 mg, Intravenous,  Once, 8 of 9 cycles Administration: 0.25 mg (02/10/2019), 0.25 mg (02/24/2019), 0.25 mg (03/10/2019), 0.25 mg (03/17/2019), 0.25 mg (03/31/2019), 0.25 mg (04/14/2019), 0.25 mg (05/12/2019), 0.25 mg (05/30/2019), 0.25 mg (06/14/2019), 0.25 mg (06/28/2019), 0.25 mg (07/13/2019), 0.25 mg (08/04/2019), 0.25 mg (08/18/2019) pegfilgrastim-jmdb (FULPHILA) injection 6 mg, 6 mg, Subcutaneous,  Once, 7 of 8 cycles Administration: 6 mg (02/25/2019), 6 mg (03/18/2019), 6 mg (05/31/2019), 6 mg (07/14/2019), 6 mg (08/05/2019), 6 mg (08/19/2019) pegfilgrastim-cbqv (UDENYCA) injection 6 mg, 6 mg, Subcutaneous, Once, 1 of 1 cycle Administration: 6 mg (06/29/2019) CISplatin (PLATINOL) 31 mg in sodium chloride 0.9 % 250 mL chemo infusion, 20 mg/m2 = 31 mg (80 % of original dose 25 mg/m2), Intravenous,  Once, 8 of 9 cycles Dose modification: 20 mg/m2 (80 % of original dose 25 mg/m2, Cycle 1, Reason: Other (see comments), Comment: renal dysfunction) Administration: 31 mg (02/10/2019), 31 mg (02/24/2019), 31 mg (03/10/2019), 31 mg (03/17/2019), 31 mg (03/31/2019), 31 mg (04/14/2019), 31 mg (05/30/2019), 31 mg (06/14/2019), 31 mg (06/28/2019), 31 mg (07/13/2019), 31 mg (08/04/2019), 31 mg (08/18/2019)  gemcitabine (GEMZAR) 1,140 mg in sodium chloride 0.9 % 250 mL chemo infusion, 750 mg/m2 = 1,140 mg (75 % of original dose 1,000 mg/m2), Intravenous,  Once, 8 of 9 cycles Dose modification: 750 mg/m2 (75 % of original dose 1,000 mg/m2, Cycle 1, Reason: Provider Judgment) Administration:  1,140 mg (02/10/2019), 1,140 mg (02/24/2019), 1,140 mg (03/10/2019), 1,140 mg (03/17/2019), 1,140 mg (03/31/2019), 1,140 mg (04/14/2019), 1,140 mg (05/12/2019), 1,140 mg (05/30/2019), 1,140 mg (06/14/2019), 1,140 mg (06/28/2019), 1,140 mg (07/13/2019), 1,140 mg (08/04/2019), 1,140 mg (08/18/2019) fosaprepitant (EMEND) 150 mg, dexamethasone (DECADRON) 12 mg in sodium chloride 0.9 % 145 mL IVPB, , Intravenous,  Once, 8 of 9 cycles Administration:  (02/10/2019),  (02/24/2019),  (03/10/2019),  (03/17/2019),  (03/31/2019),  (04/14/2019),  (05/12/2019),  (05/30/2019),  (06/14/2019),  (06/28/2019),  (07/13/2019),  (08/04/2019),  (08/18/2019)  for chemotherapy treatment.       CANCER STAGING: Cancer Staging Pancreatic cancer metastasized to liver [facility]) Staging form: Pancreas, AJCC 7th Edition - Clinical: Stage IV (T3, N1, M1) - Signed by Baird Cancer, PA-C on 03/16/2014 - Pathologic: No stage assigned - Unsigned    INTERVAL HISTORY:  Ms. Blinn 86 y.o. female seen for follow-up and toxicity assessment prior to next cycle of chemotherapy.  She felt very weak with decreased appetite in the last 2 weeks.  She lost 5 pounds since last visit 2 weeks ago.  She reported that she almost fell few times yesterday.  Denied any dizziness or headaches.  Appetite and energy levels are reported as 25%.  Denies any tingling or numbness in extremities.  No nausea or vomiting reported.  REVIEW OF SYSTEMS:  Review of Systems  Constitutional: Positive for fatigue.  All other systems reviewed and are negative.    PAST MEDICAL/SURGICAL HISTORY:  Past Medical History:  Diagnosis Date  . Anemia due to antineoplastic chemotherapy 09/12/2015   Started Aranesp 500 mcg on 09/12/2015  . Anxiety   . Chronic diarrhea   . Depression   . DNR (do not resuscitate) 08/16/2015  . Erosive esophagitis   . Family history of breast cancer in female   . Family history of prostate cancer   . GERD (gastroesophageal reflux disease)   . Hx of adenomatous  colonic polyps    tubular adenomas, last found in 2008  . Hyperplastic colon polyp 03/19/10   tcs by Dr. Gala Romney  . Hypertension 20 years   . Kidney stone    hx/ crushed   . Opioid contract exists 04/18/2015   With Dr. Moshe Cipro  . Pancreatic cancer ([place]) 02/2014  . Pancreatic cancer metastasized to liver ([place]) 03/10/2014  . Schatzki's ring 08/26/10   Last dilated on EGD by Dr. Trevor Iha [facility], linear gastric erosions, BI hemigastrectomy   Past Surgical History:  Procedure Laterality Date  . BREAST LUMPECTOMY Left   . bunion removal     from both feet   . CHOLECYSTECTOMY  1965   . COLONOSCOPY  03/19/2010   DR Gala Romney,, normal TI, pancolonic diverticula, random colon bx neg., hyperplastic polyps removed  . ESOPHAGOGASTRODUODENOSCOPY  11/22/2003   DR Gala Romney, erosive RE, Billroth I  . ESOPHAGOGASTRODUODENOSCOPY  08/26/10   Dr. Gala Romney- moderate severe ERE, Scahtzki ring s/p dilation, Billroth I, linear gastric erosions, bx-gastric xanthelasma  . LEFT SHOULDER SURGERY  2009   DR HARRISON  . ORIF ANKLE FRACTURE Right 05/22/2013   Procedure: OPEN REDUCTION INTERNAL FIXATION (ORIF) RIGHT ANKLE FRACTURE;  Surgeon: Sanjuana Kava, MD;  Location: [facility];  Service: Orthopedics;  Laterality: Right;  .  stomach ulcer  50 years ago    had some of her stomach removed      SOCIAL HISTORY:  Social History   Socioeconomic History  . Marital status: Widowed    Spouse name: Not on file  . Number of children: 2  . Years of education: 68  . Highest education level: 12th grade  Occupational History  . Occupation: retired from Estate manager/land agent: RETIRED  Tobacco Use  . Smoking status: Current Some Day Smoker    Packs/day: 0.50    Years: 60.00    Pack years: 30.00    Types: Cigarettes    Last attempt to quit: 09/09/2016    Years since quitting: 2.9  . Smokeless tobacco: Never Used  Substance and Sexual Activity  . Alcohol use: No  . Drug use: No  . Sexual activity: Not Currently  Other Topics  Concern  . Not on file  Social History Narrative   Lives with son alone    Social Determinants of Health   Financial Resource Strain:   . Difficulty of Paying Living Expenses:   Food Insecurity:   . Worried About Charity fundraiser in the Last Year:   . Arboriculturist in the Last Year:   Transportation Needs:   . Film/video editor (Medical):   Marland Kitchen Lack of Transportation (Non-Medical):   Physical Activity:   . Days of Exercise per Week:   . Minutes of Exercise per Session:   Stress:   . Feeling of Stress :   Social Connections:   . Frequency of Communication with Friends and Family:   . Frequency of Social Gatherings with Friends and Family:   . Attends Religious Services:   . Active Member of Clubs or Organizations:   . Attends Archivist Meetings:   Marland Kitchen Marital Status:   Intimate Partner Violence:   . Fear of Current or Ex-Partner:   . Emotionally Abused:   Marland Kitchen Physically Abused:   . Sexually Abused:     FAMILY HISTORY:  Family History  Problem Relation Age of Onset  . Hypertension Mother   . Kidney failure Brother        on dialysis  . Diabetes Sister   . Diabetes Sister   . Hypertension Father   . Kidney failure Sister   . Kidney Stones Brother   . Breast cancer Son 24  . Gout Son 68  . Gout Nephew 57  . Liver disease Neg Hx   . Colon cancer Neg Hx     CURRENT MEDICATIONS:  Outpatient Encounter Medications as of 09/01/2019  Medication Sig  . acetaminophen (TYLENOL) 500 MG tablet Take 500 mg by mouth every 6 (six) hours as needed for mild pain or moderate pain.   Marland Kitchen amLODipine (NORVASC) 10 MG tablet TAKE 1 TABLET DAILY  . CISPLATIN IV Inject into the vein. Days 1, 8 every 21 days  . clobetasol (TEMOVATE) 0.05 % external solution APP AA ON SCALP BID PRN NOT TO FACE GROIN OR UNDERARMS  . clonazePAM (KLONOPIN) 0.5 MG tablet Take 1 tablet (0.5 mg total) by mouth at bedtime.  . cycloSPORINE (RESTASIS) 0.05 % ophthalmic emulsion Place 1 drop into both  eyes 2 (two) times daily.  . ergocalciferol (VITAMIN D2) 1.25 MG (50000 UT) capsule Take 1 capsule (50,000 Units total) by mouth once a week.  . ergocalciferol (VITAMIN D2) 1.25 MG (50000 UT) capsule Take 1 capsule (50,000 Units total) by mouth once a week.  Marland Kitchen  fluorometholone (FML) 0.1 % ophthalmic suspension INSTILL 1 DROP INTO EACH EYE THREE TIMES DAILY FOR 14 DAYS  . fluticasone (CUTIVATE) 0.05 % cream APPLY TO THE AFFECTED AREA FACE TWICE DAILY AS NEEDED  . fluticasone (FLONASE) 50 MCG/ACT nasal spray Place 1 spray into both nostrils daily.  Marland Kitchen GEMCITABINE HCL IV Inject into the vein. Days 1, 8 every 21 days  . lidocaine (XYLOCAINE) 2 % solution RINSE WITH 5ML AS NEEDED TO RELIEVE MOUTH PAIN.  Marland Kitchen lidocaine-prilocaine (EMLA) cream Apply 1 application topically as needed. Apply to portacath site as needed  . loperamide (IMODIUM A-D) 2 MG tablet Take 2 mg by mouth 4 (four) times daily as needed for diarrhea or loose stools.  . metoprolol tartrate (LOPRESSOR) 25 MG tablet TAKE 1 TABLET DAILY FOR BLOOD PRESSURE (DOSE REDUCTION)  . mirtazapine (REMERON) 15 MG tablet Take 15 mg by mouth at bedtime.  Marland Kitchen oxybutynin (DITROPAN-XL) 5 MG 24 hr tablet TAKE 1 TABLET(5 MG) BY MOUTH AT BEDTIME  . pantoprazole (PROTONIX) 20 MG tablet TAKE 1 TABLET DAILY  . senna-docusate (SENOKOT-S) 8.6-50 MG tablet Take 1 tablet by mouth daily.  . [DISCONTINUED] prochlorperazine (COMPAZINE) 10 MG tablet Take 1 tablet (10 mg total) by mouth every 6 (six) hours as needed (Nausea or vomiting).   No facility-administered encounter medications on file as of 09/01/2019.    ALLERGIES:  Allergies  Allergen Reactions  . Iohexol Hives and Shortness Of Breath    **Patient can not have IV contrast, pt has breakthrough reaction even after 13 hour premeds** Do not give IV contrast PATIENT HAD TO BE TAKEN TO ED, C/O WHELPS, HIVES, DIFFICULTY BREATHING. PATIENT GIVEN IV CONTRAST AFTER 13 HOUR PRE MEDS ON 01/09/2017 WITH NO REACTION NOTED. On  10/19/17 patient had reaction despite premedication.    Marland Kitchen Aciphex [Rabeprazole Sodium] Other (See Comments)    unknown  . Amlodipine Besylate-Valsartan     Rash to high-dose (5/320)  . Esomeprazole Magnesium   . Omeprazole Other (See Comments)    Patient states "medication didn't work"  . Ciprofloxacin Rash  . Penicillins Swelling and Rash    Has patient had a PCN reaction causing immediate rash, facial/tongue/throat swelling, SOB or lightheadedness with hypotension: Yes Has patient had a PCN reaction causing severe rash involving mucus membranes or skin necrosis: Yes Has patient had a PCN reaction that required hospitalization: No Has patient had a PCN reaction occurring within the last 10 years: yes If all of the above answers are "NO", then may proceed with Cephalosporin use.   [name] [Benzonatate] Itching    Full head, face, neck, and chest itching.      PHYSICAL EXAM:  ECOG Performance status: 1  Vitals:   09/01/19 0809  BP: (!) 137/44  Pulse: (!) 54  Temp: (!) 96 F (35.6 C)  SpO2: 100%   Filed Weights   09/01/19 0809  Weight: 106 lb 8 oz (48.3 kg)    Physical Exam Vitals reviewed.  Constitutional:      Appearance: Normal appearance.  Cardiovascular:     Rate and Rhythm: Normal rate and regular rhythm.     Heart sounds: Normal heart sounds.  Pulmonary:     Effort: Pulmonary effort is normal.     Breath sounds: Normal breath sounds.  Abdominal:     General: There is no distension.     Palpations: Abdomen is soft. There is no mass.  Musculoskeletal:        General: No swelling.  Skin:  General: Skin is warm.  Neurological:     General: No focal deficit present.     Mental Status: She is alert and oriented to person, place, and time.  Psychiatric:        Mood and Affect: Mood normal.        Behavior: Behavior normal.      LABORATORY DATA:  I have reviewed the labs as listed.  CBC    Component Value Date/Time   WBC 11.8 (H)  09/01/2019 0748   RBC 2.90 (L) 09/01/2019 0748   HGB 8.9 (L) 09/01/2019 0748   HCT 27.6 (L) 09/01/2019 0748   PLT 112 (L) 09/01/2019 0748   MCV 95.2 09/01/2019 0748   MCH 30.7 09/01/2019 0748   MCHC 32.2 09/01/2019 0748   RDW 16.6 (H) 09/01/2019 0748   LYMPHSABS 1.2 09/01/2019 0748   MONOABS 0.9 09/01/2019 0748   EOSABS 0.1 09/01/2019 0748   BASOSABS 0.0 09/01/2019 0748   CMP Latest Ref Rng & Units 09/01/2019 08/18/2019 08/04/2019  Glucose 70 - 99 mg/dL 105(H) 100(H) 112(H)  BUN 8 - 23 mg/dL 22 25(H) 23  Creatinine 0.44 - 1.00 mg/dL 1.43(H) 1.36(H) 1.38(H)  Sodium 135 - 145 mmol/L 140 139 138  Potassium 3.5 - 5.1 mmol/L 3.6 3.9 4.0  Chloride 98 - 111 mmol/L 106 108 105  CO2 22 - 32 mmol/L '24 23 24  '$ Calcium 8.9 - 10.3 mg/dL 8.1(L) 8.0(L) 8.0(L)  Total Protein 6.5 - 8.1 g/dL 7.2 6.9 7.0  Total Bilirubin 0.3 - 1.2 mg/dL 0.5 0.7 0.4  Alkaline Phos 38 - 126 U/L 212(H) 202(H) 209(H)  AST 15 - 41 U/L '16 15 24  '$ ALT 0 - 44 U/L '11 10 14       '$ DIAGNOSTIC IMAGING:  I have reviewed the scans.   I have reviewed Venita Lick LPN's note and agree with the documentation.  I personally performed a face-to-face visit, made revisions and my assessment and plan is as follows.    ASSESSMENT & PLAN:   Pancreatic cancer metastasized to liver ([place]) 1.  Metastatic pancreatic cancer to the liver: -7 cycles of gemcitabine and cisplatin day 1 and day 15 from 02/10/2019 through 08/04/2019. -CT CAP on 06/01/2019 showed stable right hepatic lesion.  Segmental liver lesion slightly bigger.  Left and right omental nodules increased by 5 mm.  Study limited by lack of IV contrast. -CA 19-9 was 253 on 07/13/2019. -She has lost 5 pounds since last treatment 2 weeks ago.  Creatinine has increased to 1.43. -She reports that she almost fell yesterday.  She reports decrease in appetite.  Denies any nausea or vomiting. -I will hold her treatment today.  She will receive 1 L of normal saline with electrolytes today  over 2 hours. -I will see her back in 2 weeks for follow-up.  I plan to repeat CT CAP prior to next visit.  2.  Renal insufficiency: -Her creatinine is ranging between 1.3-1.5.  Today creatinine is 1.43.  She will receive hydration.  3.  Vitamin D deficiency: -We will continue vitamin D 50,000 units weekly.  4.  Overactive bladder: -She is taking Ditropan XL 5 mg at nighttime which is helping.  5.  Hypomagnesemia: -Magnesium is 1.6 today.  She will receive 2 g of magnesium.  If it continues to be low, will consider oral magnesium.     Orders placed this encounter:  Orders Placed This Encounter  Procedures  . CBC with Differential/Platelet  . Comprehensive metabolic panel  .  Magnesium     Derek Jack, MD [place] [phone]

## 2019-09-01 NOTE — Progress Notes (Signed)
Patient has been assessed, vital signs and labs have been reviewed by Dr. Delton Coombes. HOLD treatment today. Please give 1 liter fluids with electrolytes over 2 hours per Dr. Delton Coombes.

## 2019-09-01 NOTE — Assessment & Plan Note (Addendum)
1.  Metastatic pancreatic cancer to the liver: -7 cycles of gemcitabine and cisplatin day 1 and day 15 from 02/10/2019 through 08/04/2019. -CT CAP on 06/01/2019 showed stable right hepatic lesion.  Segmental liver lesion slightly bigger.  Left and right omental nodules increased by 5 mm.  Study limited by lack of IV contrast. -CA 19-9 was 253 on 07/13/2019. -She has lost 5 pounds since last treatment 2 weeks ago.  Creatinine has increased to 1.43. -She reports that she almost fell yesterday.  She reports decrease in appetite.  Denies any nausea or vomiting. -I will hold her treatment today.  She will receive 1 L of normal saline with electrolytes today over 2 hours. -I will see her back in 2 weeks for follow-up.  I plan to repeat CT CAP prior to next visit.  2.  Renal insufficiency: -Her creatinine is ranging between 1.3-1.5.  Today creatinine is 1.43.  She will receive hydration.  3.  Vitamin D deficiency: -We will continue vitamin D 50,000 units weekly.  4.  Overactive bladder: -She is taking Ditropan XL 5 mg at nighttime which is helping.  5.  Hypomagnesemia: -Magnesium is 1.6 today.  She will receive 2 g of magnesium.  If it continues to be low, will consider oral magnesium.

## 2019-09-01 NOTE — Patient Instructions (Addendum)
[place] at [facility] Discharge Instructions  You were seen today by Dr. Delton Coombes. He went over your recent lab results. He will hold your treatment today. He will see you back in 2 weeks for labs, treatment, scans and follow up.   Thank you for choosing [place] at [facility] to provide your oncology and hematology care.  To afford each patient quality time with our provider, please arrive at least 15 minutes before your scheduled appointment time.   If you have a lab appointment with the [place] please come in thru the  Main Entrance and check in at the main information desk  You need to re-schedule your appointment should you arrive 10 or more minutes late.  We strive to give you quality time with our providers, and arriving late affects you and other patients whose appointments are after yours.  Also, if you no show three or more times for appointments you may be dismissed from the clinic at the providers discretion.     Again, thank you for choosing [facility].  Our hope is that these requests will decrease the amount of time that you wait before being seen by our physicians.       _____________________________________________________________  Should you have questions after your visit to [facility], please contact our office at [phone] between the hours of 8:00 a.m. and 4:30 p.m.  Voicemails left after 4:00 p.m. will not be returned until the following business day.  For prescription refill requests, have your pharmacy contact our office and allow 72 hours.    Cancer Center Support Programs:   > Cancer Support Group  2nd Tuesday of the month 1pm-2pm, Journey Room

## 2019-09-01 NOTE — Progress Notes (Signed)
Patient presents today for treatment and follow up  Visit with Dr. Delton Coombes. Labs reviewed. Vital signs within parameters for treatment. Patient states she is very fatigued and weak.    Message received from [facility] LPN/ Dr. Melanie Crazier treatment today. Infuse Sodium Chloride 0.9% 1053mls with potassium chloride 49mEq, and magnesium sulfate 2 grams over 2  Hours.   Hydration given today per MD orders. Tolerated infusion without adverse affects. Vital signs stable. No complaints at this time. Discharged from clinic ambulatory. F/U with [facility] as scheduled.

## 2019-09-01 NOTE — Patient Instructions (Signed)
[facility] at [facility] Discharge Instructions  Labs drawn from portacath today   Thank you for choosing [facility] at [facility] to provide your oncology and hematology care.  To afford each patient quality time with our provider, please arrive at least 15 minutes before your scheduled appointment time.   If you have a lab appointment with the Cancer Center please come in thru the Main Entrance and check in at the main information desk.  You need to re-schedule your appointment should you arrive 10 or more minutes late.  We strive to give you quality time with our providers, and arriving late affects you and other patients whose appointments are after yours.  Also, if you no show three or more times for appointments you may be dismissed from the clinic at the providers discretion.     Again, thank you for choosing [facility].  Our hope is that these requests will decrease the amount of time that you wait before being seen by our physicians.       _____________________________________________________________  Should you have questions after your visit to [facility], please contact our office at [phone] between the hours of 8:00 a.m. and 4:30 p.m.  Voicemails left after 4:00 p.m. will not be returned until the following business day.  For prescription refill requests, have your pharmacy contact our office and allow 72 hours.    Due to Covid, you will need to wear a mask upon entering the hospital. If you do not have a mask, a mask will be given to you at the Main Entrance upon arrival. For doctor visits, patients may have 1 support person with them. For treatment visits, patients can not have anyone with them due to social distancing guidelines and our immunocompromised population.     

## 2019-09-02 ENCOUNTER — Ambulatory Visit (HOSPITAL_COMMUNITY): Payer: Medicare HMO

## 2019-09-13 ENCOUNTER — Other Ambulatory Visit (HOSPITAL_COMMUNITY): Payer: Self-pay | Admitting: Hematology

## 2019-09-13 ENCOUNTER — Ambulatory Visit (HOSPITAL_COMMUNITY): Payer: Medicare HMO

## 2019-09-15 ENCOUNTER — Inpatient Hospital Stay (HOSPITAL_COMMUNITY): Payer: Medicare HMO | Attending: Hematology

## 2019-09-15 ENCOUNTER — Inpatient Hospital Stay (HOSPITAL_BASED_OUTPATIENT_CLINIC_OR_DEPARTMENT_OTHER): Payer: Medicare HMO | Admitting: Hematology

## 2019-09-15 ENCOUNTER — Inpatient Hospital Stay (HOSPITAL_COMMUNITY): Payer: Medicare HMO

## 2019-09-15 ENCOUNTER — Other Ambulatory Visit: Payer: Self-pay

## 2019-09-15 VITALS — BP 142/67 | HR 58 | Temp 97.5°F | Resp 18

## 2019-09-15 DIAGNOSIS — C259 Malignant neoplasm of pancreas, unspecified: Secondary | ICD-10-CM

## 2019-09-15 DIAGNOSIS — C787 Secondary malignant neoplasm of liver and intrahepatic bile duct: Secondary | ICD-10-CM

## 2019-09-15 DIAGNOSIS — Z5111 Encounter for antineoplastic chemotherapy: Secondary | ICD-10-CM | POA: Diagnosis present

## 2019-09-15 DIAGNOSIS — D6481 Anemia due to antineoplastic chemotherapy: Secondary | ICD-10-CM

## 2019-09-15 DIAGNOSIS — C252 Malignant neoplasm of tail of pancreas: Secondary | ICD-10-CM | POA: Insufficient documentation

## 2019-09-15 LAB — CBC WITH DIFFERENTIAL/PLATELET
Abs Immature Granulocytes: 0.02 10*3/uL (ref 0.00–0.07)
Basophils Absolute: 0 10*3/uL (ref 0.0–0.1)
Basophils Relative: 0 %
Eosinophils Absolute: 0.1 10*3/uL (ref 0.0–0.5)
Eosinophils Relative: 2 %
HCT: 28.4 % — ABNORMAL LOW (ref 36.0–46.0)
Hemoglobin: 9.2 g/dL — ABNORMAL LOW (ref 12.0–15.0)
Immature Granulocytes: 0 %
Lymphocytes Relative: 15 %
Lymphs Abs: 1.2 10*3/uL (ref 0.7–4.0)
MCH: 30.6 pg (ref 26.0–34.0)
MCHC: 32.4 g/dL (ref 30.0–36.0)
MCV: 94.4 fL (ref 80.0–100.0)
Monocytes Absolute: 0.6 10*3/uL (ref 0.1–1.0)
Monocytes Relative: 7 %
Neutro Abs: 6.2 10*3/uL (ref 1.7–7.7)
Neutrophils Relative %: 76 %
Platelets: 182 10*3/uL (ref 150–400)
RBC: 3.01 MIL/uL — ABNORMAL LOW (ref 3.87–5.11)
RDW: 17 % — ABNORMAL HIGH (ref 11.5–15.5)
WBC: 8.2 10*3/uL (ref 4.0–10.5)
nRBC: 0 % (ref 0.0–0.2)

## 2019-09-15 LAB — COMPREHENSIVE METABOLIC PANEL
ALT: 26 U/L (ref 0–44)
AST: 34 U/L (ref 15–41)
Albumin: 3.5 g/dL (ref 3.5–5.0)
Alkaline Phosphatase: 180 U/L — ABNORMAL HIGH (ref 38–126)
Anion gap: 10 (ref 5–15)
BUN: 26 mg/dL — ABNORMAL HIGH (ref 8–23)
CO2: 24 mmol/L (ref 22–32)
Calcium: 8.2 mg/dL — ABNORMAL LOW (ref 8.9–10.3)
Chloride: 105 mmol/L (ref 98–111)
Creatinine, Ser: 1.39 mg/dL — ABNORMAL HIGH (ref 0.44–1.00)
GFR calc Af Amer: 39 mL/min — ABNORMAL LOW (ref >60)
GFR calc non Af Amer: 34 mL/min — ABNORMAL LOW (ref >60)
Glucose, Bld: 157 mg/dL — ABNORMAL HIGH (ref 70–99)
Potassium: 3.6 mmol/L (ref 3.5–5.1)
Sodium: 139 mmol/L (ref 135–145)
Total Bilirubin: 0.9 mg/dL (ref 0.3–1.2)
Total Protein: 7.4 g/dL (ref 6.5–8.1)

## 2019-09-15 LAB — MAGNESIUM: Magnesium: 1.6 mg/dL — ABNORMAL LOW (ref 1.7–2.4)

## 2019-09-15 MED ORDER — SODIUM CHLORIDE 0.9% FLUSH
10.0000 mL | Freq: Once | INTRAVENOUS | Status: AC | PRN
  Administered 2019-09-15: 10 mL

## 2019-09-15 MED ORDER — SODIUM CHLORIDE 0.9 % IV SOLN
Freq: Once | INTRAVENOUS | Status: AC
  Filled 2019-09-15: qty 1000

## 2019-09-15 MED ORDER — HEPARIN SOD (PORK) LOCK FLUSH 100 UNIT/ML IV SOLN
500.0000 [IU] | Freq: Once | INTRAVENOUS | Status: AC | PRN
  Administered 2019-09-15: 500 [IU]

## 2019-09-15 NOTE — Assessment & Plan Note (Addendum)
1.  Metastatic pancreatic cancer to the liver: -7 cycles of gemcitabine and cisplatin day 1 and day 15 from 02/10/2019 through 08/04/2019. -CT CAP on 06/01/2019 showed stable right hepatic lesion.  Segmental liver lesion slightly bigger.  Left and right omental nodules increased by 5 mm.  Study limited by lack of IV contrast. -CA 19-3 on 07/13/2019 was 253. -She has lost 5 pounds in the last 4 weeks.  However in the last 2 weeks her weight has been stable. -We reviewed her labs today.  White count and platelet count are adequate.  Creatinine is elevated at 1.39.  Magnesium is 1.6. -She missed her CT scan last Tuesday.  She had not had a good week as her sister died. -I will hold off on treatment today.  She is scheduled for CT scan tomorrow.  I will see her back next week for follow-up.  2.  Renal insufficiency: -Creatinine today is 1.39.  She will receive some hydration.  3.  Hypomagnesemia: -Magnesium is 1.6 today.  She will receive 2 g of magnesium.  4.  Vitamin D deficiency: -Continue vitamin D 50,000 units weekly.  5.  Overactive bladder: -Continue Ditropan XL 5 mg at night.  Will titrate up as needed.  6.  Weight loss: -She has lost 5 pounds in the last 4 weeks.  However in the last 2 weeks weight is stable at 106. -She could not tolerate Ensure or boost.  She is eating 3 small meals per day. -She will also continue Remeron 15 mg at bedtime.

## 2019-09-15 NOTE — Patient Instructions (Signed)
[facility] at [facility] Discharge Instructions  Labs drawn from portacath today   Thank you for choosing [facility] at [facility] to provide your oncology and hematology care.  To afford each patient quality time with our provider, please arrive at least 15 minutes before your scheduled appointment time.   If you have a lab appointment with the Cancer Center please come in thru the Main Entrance and check in at the main information desk.  You need to re-schedule your appointment should you arrive 10 or more minutes late.  We strive to give you quality time with our providers, and arriving late affects you and other patients whose appointments are after yours.  Also, if you no show three or more times for appointments you may be dismissed from the clinic at the providers discretion.     Again, thank you for choosing [facility].  Our hope is that these requests will decrease the amount of time that you wait before being seen by our physicians.       _____________________________________________________________  Should you have questions after your visit to [facility], please contact our office at [phone] between the hours of 8:00 a.m. and 4:30 p.m.  Voicemails left after 4:00 p.m. will not be returned until the following business day.  For prescription refill requests, have your pharmacy contact our office and allow 72 hours.    Due to Covid, you will need to wear a mask upon entering the hospital. If you do not have a mask, a mask will be given to you at the Main Entrance upon arrival. For doctor visits, patients may have 1 support person with them. For treatment visits, patients can not have anyone with them due to social distancing guidelines and our immunocompromised population.     

## 2019-09-15 NOTE — Progress Notes (Signed)
[place], [place] [address]   CLINIC:  Medical Oncology/Hematology  PCP:  Fayrene Helper, MD [address], [place] [address] [phone]   REASON FOR VISIT:  Follow-up for metastatic pancreatic cancer to the liver   BRIEF ONCOLOGIC HISTORY:  Oncology History  Pancreatic cancer metastasized to liver ([place])  03/10/2014 Initial Diagnosis   Pancreatic cancer metastasized to liver   03/22/2014 - 03/05/2016 Chemotherapy   Abraxane/Gemzar days 1, 8, every 28 days.  Day 15 was cancelled due to leukopenia and thrombocytopenia on day 15 cycle 1.   05/24/2014 Treatment Plan Change   Day 8 of cycle 3 is held with ANC of 1.1   06/05/2014 Imaging   CT C/A/P . Interval decrease in size of the pancreatic tail mass.Improved hepatic metastatic disease. No new lesions. No CT findings for metastatic disease involving the chest.   08/09/2014 Tumor Marker   CA 19-9= 33 (WNL)   10/26/2014 Imaging   MRI- Continued interval decrease in size of the hepatic metastatic lesions and no new lesions are identified. Continued decrease in size of the pancreatic tail lesion.   01/03/2015 Tumor Marker   Results for ANNAYA, BANGERT (MRN [id number]) as of 01/18/2015 12:52  01/03/2015 10:00 CA 19-9: 14    01/17/2015 Imaging   MRI- Response to therapy of hepatic metastasis.  Similar size of a pancreatic tail lesion.   03/20/2015 Imaging   MRI-L spine- Severe disc space narrowing at L2-L3, with endplate reactive changes. Large disc extrusion into the ventral epidural space,central to the RIGHT with a cephalad migrated free fragment. Additional Large disc extrusion into the retroperitone...   03/22/2015 Imaging   CT pelvis- No evidence for metastatic disease within the pelvis.   07/24/2015 Imaging   MRI abd- Continued response to therapy, with no residual detectable liver metastases. No new sites of metastatic disease in the abdomen.    08/15/2015 Code Status      She confirms desire for DNR status.   12/21/2015 Imaging   MRI liver- Severe image degradation due to motion artifact reducing diagnostic sensitivity and specificity. Reduce conspicuity of the pancreatic tail lesion suggesting further improvement. The original liver lesions have resolved.   03/05/2016 Treatment Plan Change   Chemotherapy holiday/Break after 2 years worth of treatment   04/07/2016 Imaging   CT chest- When compared to recent chest CT, new minimally displaced anterior left sixth rib fracture. Slight increase in subcarinal adenopathy   04/28/2016 Imaging   Bone density- BMD as determined from Femur Neck Right is 0.705 g/cm2 with a T-Score of -2.4. This patient is considered osteopenic according to [facility]) criteria. Compared with the prior study on 02/23/2013, the BMD of the lumbar spine/rt. femoral neck show a statistically significant decrease.    05/23/2016 Imaging   MRI abd- 1. Exam is significantly degraded by patient respiratory motion. Consider follow-up exams with CT abdomen with without contrast per pancreatic protocol. 2. Fullness in tail of pancreas is more prominent and potentially increased in size. Recommend close attention on follow-up. (Consider CT as above) 3. No explanation for back pain.   09/11/2016 Imaging   MRI pancreas- Interval progression of pancreatic tail lesion. New differential perfusion right liver with areas of heterogeneity. Underlying metastatic disease in this region not excluded.   09/11/2016 Progression   MRi in conjunction with rising CA 19-9 are indicative of relapse of disease.   01/09/2017 Progression   CT C/A/P: 2.1 x  2.9 cm lesion in the pancreatic tail and abutting the splenic hilum, corresponding to known primary pancreatic neoplasm, mildly increased.  Scattered small hypoenhancing lesions in the liver measuring up to 10 mm, approximately 8-10 in number, suspicious for hepatic metastases.  No  findings specific for metastatic disease in the chest.  Additional ancillary findings as above.    01/27/2017 - 10/20/2017 Chemotherapy   The patient had gemcitabine (GEMZAR) 1,292 mg in sodium chloride 0.9 % 250 mL chemo infusion, 800 mg/m2 = 1,292 mg, Intravenous,  Once, 9 of 12 cycles Administration: 1,292 mg (01/28/2017), 1,292 mg (02/11/2017), 1,292 mg (02/25/2017), 1,292 mg (03/11/2017), 1,292 mg (03/25/2017), 1,292 mg (04/08/2017), 1,292 mg (04/22/2017), 1,292 mg (04/29/2017), 1,292 mg (05/20/2017), 1,292 mg (06/03/2017), 1,292 mg (06/24/2017), 1,292 mg (07/08/2017), 1,292 mg (07/29/2017), 1,292 mg (08/12/2017), 1,292 mg (08/26/2017), 1,292 mg (09/09/2017), 1,292 mg (09/23/2017), 1,292 mg (10/07/2017)  for chemotherapy treatment.    10/21/2017 - 09/22/2018 Chemotherapy   The patient had leucovorin 700 mg in dextrose 5 % 250 mL infusion, 644 mg, Intravenous,  Once, 22 of 22 cycles Administration: 700 mg (10/21/2017), 700 mg (11/04/2017), 700 mg (11/18/2017), 700 mg (12/02/2017), 700 mg (12/16/2017), 700 mg (12/30/2017), 700 mg (01/13/2018), 700 mg (01/27/2018), 700 mg (02/10/2018), 700 mg (02/24/2018), 700 mg (03/10/2018), 700 mg (03/24/2018), 700 mg (04/28/2018), 700 mg (05/17/2018), 700 mg (05/31/2018), 700 mg (06/16/2018), 700 mg (06/30/2018), 700 mg (07/21/2018), 700 mg (08/04/2018), 700 mg (08/25/2018), 700 mg (09/08/2018) ondansetron (ZOFRAN) 8 mg in sodium chloride 0.9 % 50 mL IVPB, , Intravenous,  Once, 21 of 21 cycles Administration:  (11/04/2017),  (01/27/2018),  (02/10/2018),  (02/24/2018),  (03/10/2018),  (03/24/2018),  (04/28/2018),  (05/17/2018),  (05/31/2018),  (06/16/2018),  (06/30/2018),  (07/21/2018),  (08/04/2018),  (08/25/2018),  (09/08/2018) fluorouracil (ADRUCIL) 3,850 mg in sodium chloride 0.9 % 73 mL chemo infusion, 2,400 mg/m2 = 3,850 mg, Intravenous, 1 Day/Dose, 22 of 22 cycles Dose modification: 1,920 mg/m2 (original dose 2,400 mg/m2, Cycle 2, Reason: Provider Judgment, Comment: mucisitis) Administration: 3,850 mg  (10/21/2017), 3,100 mg (11/04/2017), 3,100 mg (11/18/2017), 3,100 mg (12/02/2017), 3,100 mg (12/16/2017), 3,100 mg (12/30/2017), 3,100 mg (01/13/2018), 3,100 mg (01/27/2018), 3,100 mg (02/10/2018), 3,100 mg (02/24/2018), 3,100 mg (03/10/2018), 3,100 mg (03/24/2018), 3,100 mg (04/28/2018), 3,100 mg (05/17/2018), 3,100 mg (05/31/2018), 3,100 mg (06/16/2018), 3,100 mg (06/30/2018), 3,100 mg (07/21/2018), 3,100 mg (08/04/2018), 3,100 mg (08/25/2018), 3,100 mg (09/08/2018) irinotecan LIPOSOME (ONIVYDE) 81.7 mg in sodium chloride 0.9 % 500 mL chemo infusion, 50 mg/m2 = 81.7 mg (100 % of original dose 50 mg/m2), Intravenous, Once, 22 of 22 cycles Dose modification: 50 mg/m2 (original dose 50 mg/m2, Cycle 1, Reason: Provider Judgment), 50 mg/m2 (original dose 50 mg/m2, Cycle 2, Reason: Provider Judgment) Administration: 81.7 mg (10/21/2017), 81.7 mg (11/04/2017), 81.7 mg (11/18/2017), 81.7 mg (12/02/2017), 81.7 mg (12/16/2017), 81.7 mg (12/30/2017), 81.7 mg (01/13/2018), 81.7 mg (01/27/2018), 81.7 mg (02/10/2018), 81.7 mg (02/24/2018), 81.7 mg (03/10/2018), 81.7 mg (03/24/2018), 81.7 mg (04/28/2018), 81.7 mg (05/17/2018), 81.7 mg (05/31/2018), 81.7 mg (06/16/2018), 81.7 mg (06/30/2018), 81.7 mg (07/21/2018), 81.7 mg (08/04/2018), 81.7 mg (08/25/2018), 81.7 mg (09/08/2018)  for chemotherapy treatment.    09/22/2018 - 01/11/2019 Chemotherapy   The patient had palonosetron (ALOXI) injection 0.25 mg, 0.25 mg, Intravenous,  Once, 7 of 8 cycles Administration: 0.25 mg (09/22/2018), 0.25 mg (10/11/2018), 0.25 mg (10/25/2018), 0.25 mg (12/01/2018), 0.25 mg (12/15/2018), 0.25 mg (11/17/2018), 0.25 mg (12/29/2018) pegfilgrastim (NEULASTA) injection 6 mg, 6 mg, Subcutaneous, Once, 5 of 5 cycles Administration: 6 mg (10/13/2018), 6 mg (10/27/2018), 6 mg (12/03/2018),  6 mg (12/17/2018), 6 mg (11/19/2018) pegfilgrastim-cbqv (UDENYCA) injection 6 mg, 6 mg, Subcutaneous, Once, 1 of 2 cycles Administration: 6 mg (12/31/2018) leucovorin 628 mg in dextrose 5 % 250 mL infusion, 400 mg/m2 =  628 mg, Intravenous,  Once, 7 of 8 cycles Administration: 628 mg (09/22/2018), 628 mg (10/11/2018), 628 mg (10/25/2018), 628 mg (12/01/2018), 628 mg (12/15/2018), 628 mg (11/17/2018), 628 mg (12/29/2018) oxaliplatin (ELOXATIN) 100 mg in dextrose 5 % 500 mL chemo infusion, 65 mg/m2 = 100 mg (100 % of original dose 65 mg/m2), Intravenous,  Once, 7 of 8 cycles Dose modification: 65 mg/m2 (original dose 65 mg/m2, Cycle 1, Reason: Provider Judgment) Administration: 100 mg (09/22/2018), 100 mg (10/11/2018), 100 mg (10/25/2018), 100 mg (12/01/2018), 100 mg (12/15/2018), 100 mg (11/17/2018), 100 mg (12/29/2018) fluorouracil (ADRUCIL) chemo injection 500 mg, 320 mg/m2 = 500 mg (100 % of original dose 320 mg/m2), Intravenous,  Once, 7 of 8 cycles Dose modification: 320 mg/m2 (original dose 320 mg/m2, Cycle 1, Reason: Provider Judgment) Administration: 500 mg (09/22/2018), 500 mg (10/11/2018), 500 mg (10/25/2018), 500 mg (12/01/2018), 500 mg (12/15/2018), 500 mg (11/17/2018), 500 mg (12/29/2018) fluorouracil (ADRUCIL) 3,000 mg in sodium chloride 0.9 % 90 mL chemo infusion, 1,920 mg/m2 = 3,000 mg (100 % of original dose 1,920 mg/m2), Intravenous, 1 Day/Dose, 7 of 8 cycles Dose modification: 1,920 mg/m2 (original dose 1,920 mg/m2, Cycle 1, Reason: Provider Judgment) Administration: 3,000 mg (09/22/2018), 3,000 mg (10/11/2018), 3,000 mg (10/25/2018), 3,000 mg (12/01/2018), 3,000 mg (12/15/2018), 3,000 mg (11/17/2018), 3,000 mg (12/29/2018)  for chemotherapy treatment.    12/24/2018 Genetic Testing   Negative genetic testing on the common hereditary cancer panel.  The Common Hereditary Gene Panel offered by [facility] includes sequencing and/or deletion duplication testing of the following 48 genes: APC, ATM, AXIN2, BARD1, BMPR1A, BRCA1, BRCA2, BRIP1, CDH1, CDK4, CDKN2A (p14ARF), CDKN2A (p16INK4a), CHEK2, CTNNA1, DICER1, EPCAM (Deletion/duplication testing only), GREM1 (promoter region deletion/duplication testing only), KIT, MEN1, MLH1, MSH2, MSH3, MSH6,  MUTYH, NBN, NF1, NHTL1, PALB2, PDGFRA, PMS2, POLD1, POLE, PTEN, RAD50, RAD51C, RAD51D, RNF43, SDHB, SDHC, SDHD, SMAD4, SMARCA4. STK11, TP53, TSC1, TSC2, and VHL.  The following genes were evaluated for sequence changes only: SDHA and HOXB13 c.251G>A variant only. The report date is December 24, 2018.  DICER1 VUS identified.  This will not change medical decision making.   02/10/2019 -  Chemotherapy   The patient had palonosetron (ALOXI) injection 0.25 mg, 0.25 mg, Intravenous,  Once, 8 of 9 cycles Administration: 0.25 mg (02/10/2019), 0.25 mg (02/24/2019), 0.25 mg (03/10/2019), 0.25 mg (03/17/2019), 0.25 mg (03/31/2019), 0.25 mg (04/14/2019), 0.25 mg (05/12/2019), 0.25 mg (05/30/2019), 0.25 mg (06/14/2019), 0.25 mg (06/28/2019), 0.25 mg (07/13/2019), 0.25 mg (08/04/2019), 0.25 mg (08/18/2019) pegfilgrastim-jmdb (FULPHILA) injection 6 mg, 6 mg, Subcutaneous,  Once, 7 of 8 cycles Administration: 6 mg (02/25/2019), 6 mg (03/18/2019), 6 mg (05/31/2019), 6 mg (07/14/2019), 6 mg (08/05/2019), 6 mg (08/19/2019) pegfilgrastim-cbqv (UDENYCA) injection 6 mg, 6 mg, Subcutaneous, Once, 1 of 1 cycle Administration: 6 mg (06/29/2019) CISplatin (PLATINOL) 31 mg in sodium chloride 0.9 % 250 mL chemo infusion, 20 mg/m2 = 31 mg (80 % of original dose 25 mg/m2), Intravenous,  Once, 8 of 9 cycles Dose modification: 20 mg/m2 (80 % of original dose 25 mg/m2, Cycle 1, Reason: Other (see comments), Comment: renal dysfunction) Administration: 31 mg (02/10/2019), 31 mg (02/24/2019), 31 mg (03/10/2019), 31 mg (03/17/2019), 31 mg (03/31/2019), 31 mg (04/14/2019), 31 mg (05/30/2019), 31 mg (06/14/2019), 31 mg (06/28/2019), 31 mg (07/13/2019), 31 mg (08/04/2019), 31 mg (08/18/2019)  gemcitabine (GEMZAR) 1,140 mg in sodium chloride 0.9 % 250 mL chemo infusion, 750 mg/m2 = 1,140 mg (75 % of original dose 1,000 mg/m2), Intravenous,  Once, 8 of 9 cycles Dose modification: 750 mg/m2 (75 % of original dose 1,000 mg/m2, Cycle 1, Reason: Provider Judgment) Administration:  1,140 mg (02/10/2019), 1,140 mg (02/24/2019), 1,140 mg (03/10/2019), 1,140 mg (03/17/2019), 1,140 mg (03/31/2019), 1,140 mg (04/14/2019), 1,140 mg (05/12/2019), 1,140 mg (05/30/2019), 1,140 mg (06/14/2019), 1,140 mg (06/28/2019), 1,140 mg (07/13/2019), 1,140 mg (08/04/2019), 1,140 mg (08/18/2019) fosaprepitant (EMEND) 150 mg, dexamethasone (DECADRON) 12 mg in sodium chloride 0.9 % 145 mL IVPB, , Intravenous,  Once, 8 of 9 cycles Administration:  (02/10/2019),  (02/24/2019),  (03/10/2019),  (03/17/2019),  (03/31/2019),  (04/14/2019),  (05/12/2019),  (05/30/2019),  (06/14/2019),  (06/28/2019),  (07/13/2019),  (08/04/2019),  (08/18/2019)  for chemotherapy treatment.       CANCER STAGING: Cancer Staging Pancreatic cancer metastasized to liver [facility]) Staging form: Pancreas, AJCC 7th Edition - Clinical: Stage IV (T3, N1, M1) - Signed by Baird Cancer, PA-C on 03/16/2014 - Pathologic: No stage assigned - Unsigned    INTERVAL HISTORY:  Ms. Allport 86 y.o. female seen for follow-up and toxicity assessment prior to next cycle of chemotherapy.  She reportedly missed the CT scan last Tuesday.  She had a bad week as her sister passed away.  Appetite and energy levels are 50%.  Weight has been stable.  Denies any tingling or numbness extremities.  Denies any back pain or abdominal pain.  Denies any falls.  She has constipation at times.  REVIEW OF SYSTEMS:  Review of Systems  Gastrointestinal: Positive for constipation.  All other systems reviewed and are negative.    PAST MEDICAL/SURGICAL HISTORY:  Past Medical History:  Diagnosis Date  . Anemia due to antineoplastic chemotherapy 09/12/2015   Started Aranesp 500 mcg on 09/12/2015  . Anxiety   . Chronic diarrhea   . Depression   . DNR (do not resuscitate) 08/16/2015  . Erosive esophagitis   . Family history of breast cancer in female   . Family history of prostate cancer   . GERD (gastroesophageal reflux disease)   . Hx of adenomatous colonic polyps    tubular adenomas,  last found in 2008  . Hyperplastic colon polyp 03/19/10   tcs by Dr. Gala Romney  . Hypertension 20 years   . Kidney stone    hx/ crushed   . Opioid contract exists 04/18/2015   With Dr. Moshe Cipro  . Pancreatic cancer ([place]) 02/2014  . Pancreatic cancer metastasized to liver ([place]) 03/10/2014  . Schatzki's ring 08/26/10   Last dilated on EGD by Dr. Trevor Iha [facility], linear gastric erosions, BI hemigastrectomy   Past Surgical History:  Procedure Laterality Date  . BREAST LUMPECTOMY Left   . bunion removal     from both feet   . CHOLECYSTECTOMY  1965   . COLONOSCOPY  03/19/2010   DR Gala Romney,, normal TI, pancolonic diverticula, random colon bx neg., hyperplastic polyps removed  . ESOPHAGOGASTRODUODENOSCOPY  11/22/2003   DR Gala Romney, erosive RE, Billroth I  . ESOPHAGOGASTRODUODENOSCOPY  08/26/10   Dr. Gala Romney- moderate severe ERE, Scahtzki ring s/p dilation, Billroth I, linear gastric erosions, bx-gastric xanthelasma  . LEFT SHOULDER SURGERY  2009   DR HARRISON  . ORIF ANKLE FRACTURE Right 05/22/2013   Procedure: OPEN REDUCTION INTERNAL FIXATION (ORIF) RIGHT ANKLE FRACTURE;  Surgeon: Sanjuana Kava, MD;  Location: [facility];  Service: Orthopedics;  Laterality: Right;  . stomach ulcer  50 years  ago    had some of her stomach removed      SOCIAL HISTORY:  Social History   Socioeconomic History  . Marital status: Widowed    Spouse name: Not on file  . Number of children: 2  . Years of education: 73  . Highest education level: 12th grade  Occupational History  . Occupation: retired from Estate manager/land agent: RETIRED  Tobacco Use  . Smoking status: Current Some Day Smoker    Packs/day: 0.50    Years: 60.00    Pack years: 30.00    Types: Cigarettes    Last attempt to quit: 09/09/2016    Years since quitting: 3.0  . Smokeless tobacco: Never Used  Substance and Sexual Activity  . Alcohol use: No  . Drug use: No  . Sexual activity: Not Currently  Other Topics Concern  . Not on file  Social History  Narrative   Lives with son alone    Social Determinants of Health   Financial Resource Strain:   . Difficulty of Paying Living Expenses:   Food Insecurity:   . Worried About Charity fundraiser in the Last Year:   . Arboriculturist in the Last Year:   Transportation Needs:   . Film/video editor (Medical):   Marland Kitchen Lack of Transportation (Non-Medical):   Physical Activity:   . Days of Exercise per Week:   . Minutes of Exercise per Session:   Stress:   . Feeling of Stress :   Social Connections:   . Frequency of Communication with Friends and Family:   . Frequency of Social Gatherings with Friends and Family:   . Attends Religious Services:   . Active Member of Clubs or Organizations:   . Attends Archivist Meetings:   Marland Kitchen Marital Status:   Intimate Partner Violence:   . Fear of Current or Ex-Partner:   . Emotionally Abused:   Marland Kitchen Physically Abused:   . Sexually Abused:     FAMILY HISTORY:  Family History  Problem Relation Age of Onset  . Hypertension Mother   . Kidney failure Brother        on dialysis  . Diabetes Sister   . Diabetes Sister   . Hypertension Father   . Kidney failure Sister   . Kidney Stones Brother   . Breast cancer Son 56  . Gout Son 75  . Gout Nephew 57  . Liver disease Neg Hx   . Colon cancer Neg Hx     CURRENT MEDICATIONS:  Outpatient Encounter Medications as of 09/15/2019  Medication Sig  . amLODipine (NORVASC) 10 MG tablet TAKE 1 TABLET DAILY  . CISPLATIN IV Inject into the vein. Days 1, 8 every 21 days  . clobetasol (TEMOVATE) 0.05 % external solution APP AA ON SCALP BID PRN NOT TO FACE GROIN OR UNDERARMS  . clonazePAM (KLONOPIN) 0.5 MG tablet Take 1 tablet (0.5 mg total) by mouth at bedtime.  . cycloSPORINE (RESTASIS) 0.05 % ophthalmic emulsion Place 1 drop into both eyes 2 (two) times daily.  . ergocalciferol (VITAMIN D2) 1.25 MG (50000 UT) capsule Take 1 capsule (50,000 Units total) by mouth once a week.  . fluorometholone (FML)  0.1 % ophthalmic suspension INSTILL 1 DROP INTO EACH EYE THREE TIMES DAILY FOR 14 DAYS  . fluticasone (FLONASE) 50 MCG/ACT nasal spray Place 1 spray into both nostrils daily.  Marland Kitchen GEMCITABINE HCL IV Inject into the vein. Days 1, 8 every 21 days  .  lidocaine (XYLOCAINE) 2 % solution RINSE WITH 5ML AS NEEDED TO RELIEVE MOUTH PAIN.  Marland Kitchen lidocaine-prilocaine (EMLA) cream Apply 1 application topically as needed. Apply to portacath site as needed  . metoprolol tartrate (LOPRESSOR) 25 MG tablet TAKE 1 TABLET DAILY FOR BLOOD PRESSURE (DOSE REDUCTION)  . mirtazapine (REMERON) 15 MG tablet Take 15 mg by mouth at bedtime.  Marland Kitchen oxybutynin (DITROPAN-XL) 5 MG 24 hr tablet TAKE 1 TABLET(5 MG) BY MOUTH AT BEDTIME  . pantoprazole (PROTONIX) 20 MG tablet TAKE 1 TABLET DAILY  . senna-docusate (SENOKOT-S) 8.6-50 MG tablet Take 1 tablet by mouth daily.  Marland Kitchen acetaminophen (TYLENOL) 500 MG tablet Take 500 mg by mouth every 6 (six) hours as needed for mild pain or moderate pain.   . fluticasone (CUTIVATE) 0.05 % cream APPLY TO THE AFFECTED AREA FACE TWICE DAILY AS NEEDED  . loperamide (IMODIUM A-D) 2 MG tablet Take 2 mg by mouth 4 (four) times daily as needed for diarrhea or loose stools.  . [DISCONTINUED] ergocalciferol (VITAMIN D2) 1.25 MG (50000 UT) capsule Take 1 capsule (50,000 Units total) by mouth once a week.  . [DISCONTINUED] prochlorperazine (COMPAZINE) 10 MG tablet Take 1 tablet (10 mg total) by mouth every 6 (six) hours as needed (Nausea or vomiting).   No facility-administered encounter medications on file as of 09/15/2019.    ALLERGIES:  Allergies  Allergen Reactions  . Iohexol Hives and Shortness Of Breath    **Patient can not have IV contrast, pt has breakthrough reaction even after 13 hour premeds** Do not give IV contrast PATIENT HAD TO BE TAKEN TO ED, C/O WHELPS, HIVES, DIFFICULTY BREATHING. PATIENT GIVEN IV CONTRAST AFTER 13 HOUR PRE MEDS ON 01/09/2017 WITH NO REACTION NOTED. On 10/19/17 patient had reaction  despite premedication.    Marland Kitchen Aciphex [Rabeprazole Sodium] Other (See Comments)    unknown  . Amlodipine Besylate-Valsartan     Rash to high-dose (5/320)  . Esomeprazole Magnesium   . Omeprazole Other (See Comments)    Patient states "medication didn't work"  . Ciprofloxacin Rash  . Penicillins Swelling and Rash    Has patient had a PCN reaction causing immediate rash, facial/tongue/throat swelling, SOB or lightheadedness with hypotension: Yes Has patient had a PCN reaction causing severe rash involving mucus membranes or skin necrosis: Yes Has patient had a PCN reaction that required hospitalization: No Has patient had a PCN reaction occurring within the last 10 years: yes If all of the above answers are "NO", then may proceed with Cephalosporin use.   [name] [Benzonatate] Itching    Full head, face, neck, and chest itching.      PHYSICAL EXAM:  ECOG Performance status: 1  Vitals:   09/15/19 0803  BP: (!) 130/50  Pulse: (!) 56  Resp: 16  Temp: (!) 96.9 F (36.1 C)  SpO2: 100%   Filed Weights   09/15/19 0803  Weight: 106 lb (48.1 kg)    Physical Exam Vitals reviewed.  Constitutional:      Appearance: Normal appearance.  Cardiovascular:     Rate and Rhythm: Normal rate and regular rhythm.     Heart sounds: Normal heart sounds.  Pulmonary:     Effort: Pulmonary effort is normal.     Breath sounds: Normal breath sounds.  Abdominal:     General: There is no distension.     Palpations: Abdomen is soft. There is no mass.  Musculoskeletal:        General: No swelling.  Skin:  General: Skin is warm.  Neurological:     General: No focal deficit present.     Mental Status: She is alert and oriented to person, place, and time.  Psychiatric:        Mood and Affect: Mood normal.        Behavior: Behavior normal.      LABORATORY DATA:  I have reviewed the labs as listed.  CBC    Component Value Date/Time   WBC 8.2 09/15/2019 0759   RBC 3.01 (L)  09/15/2019 0759   HGB 9.2 (L) 09/15/2019 0759   HCT 28.4 (L) 09/15/2019 0759   PLT 182 09/15/2019 0759   MCV 94.4 09/15/2019 0759   MCH 30.6 09/15/2019 0759   MCHC 32.4 09/15/2019 0759   RDW 17.0 (H) 09/15/2019 0759   LYMPHSABS 1.2 09/15/2019 0759   MONOABS 0.6 09/15/2019 0759   EOSABS 0.1 09/15/2019 0759   BASOSABS 0.0 09/15/2019 0759   CMP Latest Ref Rng & Units 09/15/2019 09/01/2019 08/18/2019  Glucose 70 - 99 mg/dL 157(H) 105(H) 100(H)  BUN 8 - 23 mg/dL 26(H) 22 25(H)  Creatinine 0.44 - 1.00 mg/dL 1.39(H) 1.43(H) 1.36(H)  Sodium 135 - 145 mmol/L 139 140 139  Potassium 3.5 - 5.1 mmol/L 3.6 3.6 3.9  Chloride 98 - 111 mmol/L 105 106 108  CO2 22 - 32 mmol/L '24 24 23  '$ Calcium 8.9 - 10.3 mg/dL 8.2(L) 8.1(L) 8.0(L)  Total Protein 6.5 - 8.1 g/dL 7.4 7.2 6.9  Total Bilirubin 0.3 - 1.2 mg/dL 0.9 0.5 0.7  Alkaline Phos 38 - 126 U/L 180(H) 212(H) 202(H)  AST 15 - 41 U/L 34 16 15  ALT 0 - 44 U/L '26 11 10       '$ DIAGNOSTIC IMAGING:  I have reviewed the scans.   I have reviewed Venita Lick LPN's note and agree with the documentation.  I personally performed a face-to-face visit, made revisions and my assessment and plan is as follows.    ASSESSMENT & PLAN:   Pancreatic cancer metastasized to liver ([place]) 1.  Metastatic pancreatic cancer to the liver: -7 cycles of gemcitabine and cisplatin day 1 and day 15 from 02/10/2019 through 08/04/2019. -CT CAP on 06/01/2019 showed stable right hepatic lesion.  Segmental liver lesion slightly bigger.  Left and right omental nodules increased by 5 mm.  Study limited by lack of IV contrast. -CA 19-3 on 07/13/2019 was 253. -She has lost 5 pounds in the last 4 weeks.  However in the last 2 weeks her weight has been stable. -We reviewed her labs today.  White count and platelet count are adequate.  Creatinine is elevated at 1.39.  Magnesium is 1.6. -She missed her CT scan last Tuesday.  She had not had a good week as her sister died. -I will hold off on  treatment today.  She is scheduled for CT scan tomorrow.  I will see her back next week for follow-up.  2.  Renal insufficiency: -Creatinine today is 1.39.  She will receive some hydration.  3.  Hypomagnesemia: -Magnesium is 1.6 today.  She will receive 2 g of magnesium.  4.  Vitamin D deficiency: -Continue vitamin D 50,000 units weekly.  5.  Overactive bladder: -Continue Ditropan XL 5 mg at night.  Will titrate up as needed.  6.  Weight loss: -She has lost 5 pounds in the last 4 weeks.  However in the last 2 weeks weight is stable at 106. -She could not tolerate Ensure or boost.  She  is eating 3 small meals per day. -She will also continue Remeron 15 mg at bedtime.     Orders placed this encounter:  No orders of the defined types were placed in this encounter.    Derek Jack, MD [place] [phone]

## 2019-09-15 NOTE — Patient Instructions (Addendum)
[facility] at [facility] Discharge Instructions  You were seen today by Dr. Katragadda. He went over your recent lab results. He will see you back in 1 week for labs, treatment and follow up.   Thank you for choosing [facility] at [facility] to provide your oncology and hematology care.  To afford each patient quality time with our provider, please arrive at least 15 minutes before your scheduled appointment time.   If you have a lab appointment with the Cancer Center please come in thru the  Main Entrance and check in at the main information desk  You need to re-schedule your appointment should you arrive 10 or more minutes late.  We strive to give you quality time with our providers, and arriving late affects you and other patients whose appointments are after yours.  Also, if you no show three or more times for appointments you may be dismissed from the clinic at the providers discretion.     Again, thank you for choosing [facility].  Our hope is that these requests will decrease the amount of time that you wait before being seen by our physicians.       _____________________________________________________________  Should you have questions after your visit to [facility], please contact our office at [phone] between the hours of 8:00 a.m. and 4:30 p.m.  Voicemails left after 4:00 p.m. will not be returned until the following business day.  For prescription refill requests, have your pharmacy contact our office and allow 72 hours.    Cancer Center Support Programs:   > Cancer Support Group  2nd Tuesday of the month 1pm-2pm, Journey Room    

## 2019-09-15 NOTE — Patient Instructions (Signed)
[place] at [facility] Discharge Instructions  Chemo tx held today. IV hydration with magnesium and potassium given. Follow-up as scheduled. Call clinic for any questions or concerns   Thank you for choosing [place] at [facility] to provide your oncology and hematology care.  To afford each patient quality time with our provider, please arrive at least 15 minutes before your scheduled appointment time.   If you have a lab appointment with the [facility] please come in thru the Main Entrance and check in at the main information desk.  You need to re-schedule your appointment should you arrive 10 or more minutes late.  We strive to give you quality time with our providers, and arriving late affects you and other patients whose appointments are after yours.  Also, if you no show three or more times for appointments you may be dismissed from the clinic at the providers discretion.     Again, thank you for choosing [facility].  Our hope is that these requests will decrease the amount of time that you wait before being seen by our physicians.       _____________________________________________________________  Should you have questions after your visit to [facility], please contact our office at [phone] between the hours of 8:00 a.m. and 4:30 p.m.  Voicemails left after 4:00 p.m. will not be returned until the following business day.  For prescription refill requests, have your pharmacy contact our office and allow 72 hours.    Due to Covid, you will need to wear a mask upon entering the hospital. If you do not have a mask, a mask will be given to you at the Main Entrance upon arrival. For doctor visits, patients may have 1 support person with them. For treatment visits, patients can not have anyone with them due to social distancing guidelines and our immunocompromised population.

## 2019-09-15 NOTE — Progress Notes (Signed)
0922 Labs reviewed with and pt seen by Dr. Delton Coombes and pt's chemo tx to be held today with only IV hydration with magnesium and potassium to be given over 2 hours per MD order                                                                                                 [name] tolerated IV hydration well without complaints or incident. VSS upon discharge. Pt discharged self ambulatory using her cane in satisfactory condition

## 2019-09-15 NOTE — Progress Notes (Signed)
Patient has been assessed, vital signs and labs have been reviewed by Dr. Delton Coombes. HOLD Tx today, give 1 liter fluids with electrolytes over 2 hours per Dr. Delton Coombes.

## 2019-09-16 ENCOUNTER — Ambulatory Visit (HOSPITAL_COMMUNITY)
Admission: RE | Admit: 2019-09-16 | Discharge: 2019-09-16 | Disposition: A | Discharge status: Home / Self Care | Payer: Medicare HMO | Source: Ambulatory Visit | Attending: Hematology | Admitting: Hematology

## 2019-09-16 ENCOUNTER — Ambulatory Visit (HOSPITAL_COMMUNITY): Payer: Medicare HMO

## 2019-09-16 DIAGNOSIS — C787 Secondary malignant neoplasm of liver and intrahepatic bile duct: Secondary | ICD-10-CM | POA: Insufficient documentation

## 2019-09-16 DIAGNOSIS — D6481 Anemia due to antineoplastic chemotherapy: Secondary | ICD-10-CM | POA: Diagnosis present

## 2019-09-16 DIAGNOSIS — T451X5A Adverse effect of antineoplastic and immunosuppressive drugs, initial encounter: Secondary | ICD-10-CM | POA: Diagnosis present

## 2019-09-16 DIAGNOSIS — C259 Malignant neoplasm of pancreas, unspecified: Secondary | ICD-10-CM | POA: Diagnosis present

## 2019-09-22 ENCOUNTER — Inpatient Hospital Stay (HOSPITAL_BASED_OUTPATIENT_CLINIC_OR_DEPARTMENT_OTHER): Payer: Medicare HMO | Admitting: Hematology

## 2019-09-22 ENCOUNTER — Inpatient Hospital Stay (HOSPITAL_COMMUNITY): Payer: Medicare HMO

## 2019-09-22 ENCOUNTER — Other Ambulatory Visit: Payer: Self-pay

## 2019-09-22 VITALS — BP 165/60 | HR 61 | Temp 97.0°F | Resp 18

## 2019-09-22 DIAGNOSIS — C259 Malignant neoplasm of pancreas, unspecified: Secondary | ICD-10-CM | POA: Diagnosis not present

## 2019-09-22 DIAGNOSIS — Z5111 Encounter for antineoplastic chemotherapy: Secondary | ICD-10-CM | POA: Diagnosis not present

## 2019-09-22 DIAGNOSIS — C787 Secondary malignant neoplasm of liver and intrahepatic bile duct: Secondary | ICD-10-CM

## 2019-09-22 DIAGNOSIS — D6481 Anemia due to antineoplastic chemotherapy: Secondary | ICD-10-CM

## 2019-09-22 LAB — CBC WITH DIFFERENTIAL/PLATELET
Abs Immature Granulocytes: 0.01 10*3/uL (ref 0.00–0.07)
Basophils Absolute: 0 10*3/uL (ref 0.0–0.1)
Basophils Relative: 1 %
Eosinophils Absolute: 0.2 10*3/uL (ref 0.0–0.5)
Eosinophils Relative: 3 %
HCT: 28.2 % — ABNORMAL LOW (ref 36.0–46.0)
Hemoglobin: 9.2 g/dL — ABNORMAL LOW (ref 12.0–15.0)
Immature Granulocytes: 0 %
Lymphocytes Relative: 17 %
Lymphs Abs: 1.2 10*3/uL (ref 0.7–4.0)
MCH: 30.7 pg (ref 26.0–34.0)
MCHC: 32.6 g/dL (ref 30.0–36.0)
MCV: 94 fL (ref 80.0–100.0)
Monocytes Absolute: 0.5 10*3/uL (ref 0.1–1.0)
Monocytes Relative: 7 %
Neutro Abs: 5.4 10*3/uL (ref 1.7–7.7)
Neutrophils Relative %: 72 %
Platelets: 168 10*3/uL (ref 150–400)
RBC: 3 MIL/uL — ABNORMAL LOW (ref 3.87–5.11)
RDW: 16.6 % — ABNORMAL HIGH (ref 11.5–15.5)
WBC: 7.4 10*3/uL (ref 4.0–10.5)
nRBC: 0 % (ref 0.0–0.2)

## 2019-09-22 LAB — MAGNESIUM: Magnesium: 1.7 mg/dL (ref 1.7–2.4)

## 2019-09-22 LAB — COMPREHENSIVE METABOLIC PANEL
ALT: 96 U/L — ABNORMAL HIGH (ref 0–44)
AST: 93 U/L — ABNORMAL HIGH (ref 15–41)
Albumin: 3.6 g/dL (ref 3.5–5.0)
Alkaline Phosphatase: 254 U/L — ABNORMAL HIGH (ref 38–126)
Anion gap: 10 (ref 5–15)
BUN: 28 mg/dL — ABNORMAL HIGH (ref 8–23)
CO2: 22 mmol/L (ref 22–32)
Calcium: 8.2 mg/dL — ABNORMAL LOW (ref 8.9–10.3)
Chloride: 106 mmol/L (ref 98–111)
Creatinine, Ser: 1.52 mg/dL — ABNORMAL HIGH (ref 0.44–1.00)
GFR calc Af Amer: 35 mL/min — ABNORMAL LOW (ref >60)
GFR calc non Af Amer: 30 mL/min — ABNORMAL LOW (ref >60)
Glucose, Bld: 110 mg/dL — ABNORMAL HIGH (ref 70–99)
Potassium: 3.9 mmol/L (ref 3.5–5.1)
Sodium: 138 mmol/L (ref 135–145)
Total Bilirubin: 0.8 mg/dL (ref 0.3–1.2)
Total Protein: 7.5 g/dL (ref 6.5–8.1)

## 2019-09-22 LAB — LACTATE DEHYDROGENASE: LDH: 158 U/L (ref 98–192)

## 2019-09-22 MED ORDER — SODIUM CHLORIDE 0.9 % IV SOLN
Freq: Once | INTRAVENOUS | Status: AC
  Filled 2019-09-22: qty 1000

## 2019-09-22 MED ORDER — SODIUM CHLORIDE 0.9% FLUSH
10.0000 mL | Freq: Once | INTRAVENOUS | Status: AC | PRN
  Administered 2019-09-22: 10 mL

## 2019-09-22 MED ORDER — HEPARIN SOD (PORK) LOCK FLUSH 100 UNIT/ML IV SOLN
500.0000 [IU] | Freq: Once | INTRAVENOUS | Status: AC | PRN
  Administered 2019-09-22: 500 [IU]

## 2019-09-22 NOTE — Progress Notes (Signed)
Patient has been assessed, vital signs and labs have been reviewed by Dr. Delton Coombes. Liver and kidney counts are elevated today, please give 1 liter fluids with electrolytes over 2 hours per Dr. Delton Coombes.

## 2019-09-22 NOTE — Progress Notes (Signed)
[place], [place] [address]   CLINIC:  Medical Oncology/Hematology  PCP:  Fayrene Helper, MD [address], [place] [address] [phone]   REASON FOR VISIT:  Follow-up for metastatic pancreatic cancer to the liver   BRIEF ONCOLOGIC HISTORY:  Oncology History  Pancreatic cancer metastasized to liver ([place])  03/10/2014 Initial Diagnosis   Pancreatic cancer metastasized to liver   03/22/2014 - 03/05/2016 Chemotherapy   Abraxane/Gemzar days 1, 8, every 28 days.  Day 15 was cancelled due to leukopenia and thrombocytopenia on day 15 cycle 1.   05/24/2014 Treatment Plan Change   Day 8 of cycle 3 is held with ANC of 1.1   06/05/2014 Imaging   CT C/A/P . Interval decrease in size of the pancreatic tail mass.Improved hepatic metastatic disease. No new lesions. No CT findings for metastatic disease involving the chest.   08/09/2014 Tumor Marker   CA 19-9= 33 (WNL)   10/26/2014 Imaging   MRI- Continued interval decrease in size of the hepatic metastatic lesions and no new lesions are identified. Continued decrease in size of the pancreatic tail lesion.   01/03/2015 Tumor Marker   Results for ANNAYA, BANGERT (MRN [id number]) as of 01/18/2015 12:52  01/03/2015 10:00 CA 19-9: 14    01/17/2015 Imaging   MRI- Response to therapy of hepatic metastasis.  Similar size of a pancreatic tail lesion.   03/20/2015 Imaging   MRI-L spine- Severe disc space narrowing at L2-L3, with endplate reactive changes. Large disc extrusion into the ventral epidural space,central to the RIGHT with a cephalad migrated free fragment. Additional Large disc extrusion into the retroperitone...   03/22/2015 Imaging   CT pelvis- No evidence for metastatic disease within the pelvis.   07/24/2015 Imaging   MRI abd- Continued response to therapy, with no residual detectable liver metastases. No new sites of metastatic disease in the abdomen.    08/15/2015 Code Status      She confirms desire for DNR status.   12/21/2015 Imaging   MRI liver- Severe image degradation due to motion artifact reducing diagnostic sensitivity and specificity. Reduce conspicuity of the pancreatic tail lesion suggesting further improvement. The original liver lesions have resolved.   03/05/2016 Treatment Plan Change   Chemotherapy holiday/Break after 2 years worth of treatment   04/07/2016 Imaging   CT chest- When compared to recent chest CT, new minimally displaced anterior left sixth rib fracture. Slight increase in subcarinal adenopathy   04/28/2016 Imaging   Bone density- BMD as determined from Femur Neck Right is 0.705 g/cm2 with a T-Score of -2.4. This patient is considered osteopenic according to [facility]) criteria. Compared with the prior study on 02/23/2013, the BMD of the lumbar spine/rt. femoral neck show a statistically significant decrease.    05/23/2016 Imaging   MRI abd- 1. Exam is significantly degraded by patient respiratory motion. Consider follow-up exams with CT abdomen with without contrast per pancreatic protocol. 2. Fullness in tail of pancreas is more prominent and potentially increased in size. Recommend close attention on follow-up. (Consider CT as above) 3. No explanation for back pain.   09/11/2016 Imaging   MRI pancreas- Interval progression of pancreatic tail lesion. New differential perfusion right liver with areas of heterogeneity. Underlying metastatic disease in this region not excluded.   09/11/2016 Progression   MRi in conjunction with rising CA 19-9 are indicative of relapse of disease.   01/09/2017 Progression   CT C/A/P: 2.1 x  2.9 cm lesion in the pancreatic tail and abutting the splenic hilum, corresponding to known primary pancreatic neoplasm, mildly increased.  Scattered small hypoenhancing lesions in the liver measuring up to 10 mm, approximately 8-10 in number, suspicious for hepatic metastases.  No  findings specific for metastatic disease in the chest.  Additional ancillary findings as above.    01/27/2017 - 10/20/2017 Chemotherapy   The patient had gemcitabine (GEMZAR) 1,292 mg in sodium chloride 0.9 % 250 mL chemo infusion, 800 mg/m2 = 1,292 mg, Intravenous,  Once, 9 of 12 cycles Administration: 1,292 mg (01/28/2017), 1,292 mg (02/11/2017), 1,292 mg (02/25/2017), 1,292 mg (03/11/2017), 1,292 mg (03/25/2017), 1,292 mg (04/08/2017), 1,292 mg (04/22/2017), 1,292 mg (04/29/2017), 1,292 mg (05/20/2017), 1,292 mg (06/03/2017), 1,292 mg (06/24/2017), 1,292 mg (07/08/2017), 1,292 mg (07/29/2017), 1,292 mg (08/12/2017), 1,292 mg (08/26/2017), 1,292 mg (09/09/2017), 1,292 mg (09/23/2017), 1,292 mg (10/07/2017)  for chemotherapy treatment.    10/21/2017 - 09/22/2018 Chemotherapy   The patient had leucovorin 700 mg in dextrose 5 % 250 mL infusion, 644 mg, Intravenous,  Once, 22 of 22 cycles Administration: 700 mg (10/21/2017), 700 mg (11/04/2017), 700 mg (11/18/2017), 700 mg (12/02/2017), 700 mg (12/16/2017), 700 mg (12/30/2017), 700 mg (01/13/2018), 700 mg (01/27/2018), 700 mg (02/10/2018), 700 mg (02/24/2018), 700 mg (03/10/2018), 700 mg (03/24/2018), 700 mg (04/28/2018), 700 mg (05/17/2018), 700 mg (05/31/2018), 700 mg (06/16/2018), 700 mg (06/30/2018), 700 mg (07/21/2018), 700 mg (08/04/2018), 700 mg (08/25/2018), 700 mg (09/08/2018) ondansetron (ZOFRAN) 8 mg in sodium chloride 0.9 % 50 mL IVPB, , Intravenous,  Once, 21 of 21 cycles Administration:  (11/04/2017),  (01/27/2018),  (02/10/2018),  (02/24/2018),  (03/10/2018),  (03/24/2018),  (04/28/2018),  (05/17/2018),  (05/31/2018),  (06/16/2018),  (06/30/2018),  (07/21/2018),  (08/04/2018),  (08/25/2018),  (09/08/2018) fluorouracil (ADRUCIL) 3,850 mg in sodium chloride 0.9 % 73 mL chemo infusion, 2,400 mg/m2 = 3,850 mg, Intravenous, 1 Day/Dose, 22 of 22 cycles Dose modification: 1,920 mg/m2 (original dose 2,400 mg/m2, Cycle 2, Reason: Provider Judgment, Comment: mucisitis) Administration: 3,850 mg  (10/21/2017), 3,100 mg (11/04/2017), 3,100 mg (11/18/2017), 3,100 mg (12/02/2017), 3,100 mg (12/16/2017), 3,100 mg (12/30/2017), 3,100 mg (01/13/2018), 3,100 mg (01/27/2018), 3,100 mg (02/10/2018), 3,100 mg (02/24/2018), 3,100 mg (03/10/2018), 3,100 mg (03/24/2018), 3,100 mg (04/28/2018), 3,100 mg (05/17/2018), 3,100 mg (05/31/2018), 3,100 mg (06/16/2018), 3,100 mg (06/30/2018), 3,100 mg (07/21/2018), 3,100 mg (08/04/2018), 3,100 mg (08/25/2018), 3,100 mg (09/08/2018) irinotecan LIPOSOME (ONIVYDE) 81.7 mg in sodium chloride 0.9 % 500 mL chemo infusion, 50 mg/m2 = 81.7 mg (100 % of original dose 50 mg/m2), Intravenous, Once, 22 of 22 cycles Dose modification: 50 mg/m2 (original dose 50 mg/m2, Cycle 1, Reason: Provider Judgment), 50 mg/m2 (original dose 50 mg/m2, Cycle 2, Reason: Provider Judgment) Administration: 81.7 mg (10/21/2017), 81.7 mg (11/04/2017), 81.7 mg (11/18/2017), 81.7 mg (12/02/2017), 81.7 mg (12/16/2017), 81.7 mg (12/30/2017), 81.7 mg (01/13/2018), 81.7 mg (01/27/2018), 81.7 mg (02/10/2018), 81.7 mg (02/24/2018), 81.7 mg (03/10/2018), 81.7 mg (03/24/2018), 81.7 mg (04/28/2018), 81.7 mg (05/17/2018), 81.7 mg (05/31/2018), 81.7 mg (06/16/2018), 81.7 mg (06/30/2018), 81.7 mg (07/21/2018), 81.7 mg (08/04/2018), 81.7 mg (08/25/2018), 81.7 mg (09/08/2018)  for chemotherapy treatment.    09/22/2018 - 01/11/2019 Chemotherapy   The patient had palonosetron (ALOXI) injection 0.25 mg, 0.25 mg, Intravenous,  Once, 7 of 8 cycles Administration: 0.25 mg (09/22/2018), 0.25 mg (10/11/2018), 0.25 mg (10/25/2018), 0.25 mg (12/01/2018), 0.25 mg (12/15/2018), 0.25 mg (11/17/2018), 0.25 mg (12/29/2018) pegfilgrastim (NEULASTA) injection 6 mg, 6 mg, Subcutaneous, Once, 5 of 5 cycles Administration: 6 mg (10/13/2018), 6 mg (10/27/2018), 6 mg (12/03/2018),  6 mg (12/17/2018), 6 mg (11/19/2018) pegfilgrastim-cbqv (UDENYCA) injection 6 mg, 6 mg, Subcutaneous, Once, 1 of 2 cycles Administration: 6 mg (12/31/2018) leucovorin 628 mg in dextrose 5 % 250 mL infusion, 400 mg/m2 =  628 mg, Intravenous,  Once, 7 of 8 cycles Administration: 628 mg (09/22/2018), 628 mg (10/11/2018), 628 mg (10/25/2018), 628 mg (12/01/2018), 628 mg (12/15/2018), 628 mg (11/17/2018), 628 mg (12/29/2018) oxaliplatin (ELOXATIN) 100 mg in dextrose 5 % 500 mL chemo infusion, 65 mg/m2 = 100 mg (100 % of original dose 65 mg/m2), Intravenous,  Once, 7 of 8 cycles Dose modification: 65 mg/m2 (original dose 65 mg/m2, Cycle 1, Reason: Provider Judgment) Administration: 100 mg (09/22/2018), 100 mg (10/11/2018), 100 mg (10/25/2018), 100 mg (12/01/2018), 100 mg (12/15/2018), 100 mg (11/17/2018), 100 mg (12/29/2018) fluorouracil (ADRUCIL) chemo injection 500 mg, 320 mg/m2 = 500 mg (100 % of original dose 320 mg/m2), Intravenous,  Once, 7 of 8 cycles Dose modification: 320 mg/m2 (original dose 320 mg/m2, Cycle 1, Reason: Provider Judgment) Administration: 500 mg (09/22/2018), 500 mg (10/11/2018), 500 mg (10/25/2018), 500 mg (12/01/2018), 500 mg (12/15/2018), 500 mg (11/17/2018), 500 mg (12/29/2018) fluorouracil (ADRUCIL) 3,000 mg in sodium chloride 0.9 % 90 mL chemo infusion, 1,920 mg/m2 = 3,000 mg (100 % of original dose 1,920 mg/m2), Intravenous, 1 Day/Dose, 7 of 8 cycles Dose modification: 1,920 mg/m2 (original dose 1,920 mg/m2, Cycle 1, Reason: Provider Judgment) Administration: 3,000 mg (09/22/2018), 3,000 mg (10/11/2018), 3,000 mg (10/25/2018), 3,000 mg (12/01/2018), 3,000 mg (12/15/2018), 3,000 mg (11/17/2018), 3,000 mg (12/29/2018)  for chemotherapy treatment.    12/24/2018 Genetic Testing   Negative genetic testing on the common hereditary cancer panel.  The Common Hereditary Gene Panel offered by [facility] includes sequencing and/or deletion duplication testing of the following 48 genes: APC, ATM, AXIN2, BARD1, BMPR1A, BRCA1, BRCA2, BRIP1, CDH1, CDK4, CDKN2A (p14ARF), CDKN2A (p16INK4a), CHEK2, CTNNA1, DICER1, EPCAM (Deletion/duplication testing only), GREM1 (promoter region deletion/duplication testing only), KIT, MEN1, MLH1, MSH2, MSH3, MSH6,  MUTYH, NBN, NF1, NHTL1, PALB2, PDGFRA, PMS2, POLD1, POLE, PTEN, RAD50, RAD51C, RAD51D, RNF43, SDHB, SDHC, SDHD, SMAD4, SMARCA4. STK11, TP53, TSC1, TSC2, and VHL.  The following genes were evaluated for sequence changes only: SDHA and HOXB13 c.251G>A variant only. The report date is December 24, 2018.  DICER1 VUS identified.  This will not change medical decision making.   02/10/2019 -  Chemotherapy   The patient had palonosetron (ALOXI) injection 0.25 mg, 0.25 mg, Intravenous,  Once, 8 of 9 cycles Administration: 0.25 mg (02/10/2019), 0.25 mg (02/24/2019), 0.25 mg (03/10/2019), 0.25 mg (03/17/2019), 0.25 mg (03/31/2019), 0.25 mg (04/14/2019), 0.25 mg (05/12/2019), 0.25 mg (05/30/2019), 0.25 mg (06/14/2019), 0.25 mg (06/28/2019), 0.25 mg (07/13/2019), 0.25 mg (08/04/2019), 0.25 mg (08/18/2019) pegfilgrastim-jmdb (FULPHILA) injection 6 mg, 6 mg, Subcutaneous,  Once, 7 of 8 cycles Administration: 6 mg (02/25/2019), 6 mg (03/18/2019), 6 mg (05/31/2019), 6 mg (07/14/2019), 6 mg (08/05/2019), 6 mg (08/19/2019) pegfilgrastim-cbqv (UDENYCA) injection 6 mg, 6 mg, Subcutaneous, Once, 1 of 1 cycle Administration: 6 mg (06/29/2019) CISplatin (PLATINOL) 31 mg in sodium chloride 0.9 % 250 mL chemo infusion, 20 mg/m2 = 31 mg (80 % of original dose 25 mg/m2), Intravenous,  Once, 8 of 9 cycles Dose modification: 20 mg/m2 (80 % of original dose 25 mg/m2, Cycle 1, Reason: Other (see comments), Comment: renal dysfunction) Administration: 31 mg (02/10/2019), 31 mg (02/24/2019), 31 mg (03/10/2019), 31 mg (03/17/2019), 31 mg (03/31/2019), 31 mg (04/14/2019), 31 mg (05/30/2019), 31 mg (06/14/2019), 31 mg (06/28/2019), 31 mg (07/13/2019), 31 mg (08/04/2019), 31 mg (08/18/2019)  gemcitabine (GEMZAR) 1,140 mg in sodium chloride 0.9 % 250 mL chemo infusion, 750 mg/m2 = 1,140 mg (75 % of original dose 1,000 mg/m2), Intravenous,  Once, 8 of 9 cycles Dose modification: 750 mg/m2 (75 % of original dose 1,000 mg/m2, Cycle 1, Reason: Provider Judgment) Administration:  1,140 mg (02/10/2019), 1,140 mg (02/24/2019), 1,140 mg (03/10/2019), 1,140 mg (03/17/2019), 1,140 mg (03/31/2019), 1,140 mg (04/14/2019), 1,140 mg (05/12/2019), 1,140 mg (05/30/2019), 1,140 mg (06/14/2019), 1,140 mg (06/28/2019), 1,140 mg (07/13/2019), 1,140 mg (08/04/2019), 1,140 mg (08/18/2019) fosaprepitant (EMEND) 150 mg, dexamethasone (DECADRON) 12 mg in sodium chloride 0.9 % 145 mL IVPB, , Intravenous,  Once, 8 of 9 cycles Administration:  (02/10/2019),  (02/24/2019),  (03/10/2019),  (03/17/2019),  (03/31/2019),  (04/14/2019),  (05/12/2019),  (05/30/2019),  (06/14/2019),  (06/28/2019),  (07/13/2019),  (08/04/2019),  (08/18/2019)  for chemotherapy treatment.       CANCER STAGING: Cancer Staging Pancreatic cancer metastasized to liver [facility]) Staging form: Pancreas, AJCC 7th Edition - Clinical: Stage IV (T3, N1, M1) - Signed by Baird Cancer, PA-C on 03/16/2014 - Pathologic: No stage assigned - Unsigned    INTERVAL HISTORY:  [name] 86 y.o. female seen for follow-up and toxicity assessment prior to next cycle of chemotherapy.  She had CT scans done.  She is accompanied by her son.  She is reportedly eating only portions of her meals.  She took Tylenol 2 days ago for back pain.  She does not report any pains at this time.  Appetite and energy levels are 50%.  Chronic cough is stable.  REVIEW OF SYSTEMS:  Review of Systems  Respiratory: Positive for cough.   Gastrointestinal: Positive for constipation.  All other systems reviewed and are negative.    PAST MEDICAL/SURGICAL HISTORY:  Past Medical History:  Diagnosis Date  . Anemia due to antineoplastic chemotherapy 09/12/2015   Started Aranesp 500 mcg on 09/12/2015  . Anxiety   . Chronic diarrhea   . Depression   . DNR (do not resuscitate) 08/16/2015  . Erosive esophagitis   . Family history of breast cancer in female   . Family history of prostate cancer   . GERD (gastroesophageal reflux disease)   . Hx of adenomatous colonic polyps    tubular  adenomas, last found in 2008  . Hyperplastic colon polyp 03/19/10   tcs by Dr. Gala Romney  . Hypertension 20 years   . Kidney stone    hx/ crushed   . Opioid contract exists 04/18/2015   With Dr. Moshe Cipro  . Pancreatic cancer ([place]) 02/2014  . Pancreatic cancer metastasized to liver ([place]) 03/10/2014  . Schatzki's ring 08/26/10   Last dilated on EGD by Dr. Trevor Iha [facility], linear gastric erosions, BI hemigastrectomy   Past Surgical History:  Procedure Laterality Date  . BREAST LUMPECTOMY Left   . bunion removal     from both feet   . CHOLECYSTECTOMY  1965   . COLONOSCOPY  03/19/2010   DR Gala Romney,, normal TI, pancolonic diverticula, random colon bx neg., hyperplastic polyps removed  . ESOPHAGOGASTRODUODENOSCOPY  11/22/2003   DR Gala Romney, erosive RE, Billroth I  . ESOPHAGOGASTRODUODENOSCOPY  08/26/10   Dr. Gala Romney- moderate severe ERE, Scahtzki ring s/p dilation, Billroth I, linear gastric erosions, bx-gastric xanthelasma  . LEFT SHOULDER SURGERY  2009   DR HARRISON  . ORIF ANKLE FRACTURE Right 05/22/2013   Procedure: OPEN REDUCTION INTERNAL FIXATION (ORIF) RIGHT ANKLE FRACTURE;  Surgeon: Sanjuana Kava, MD;  Location: [facility];  Service: Orthopedics;  Laterality: Right;  . stomach  ulcer  50 years ago    had some of her stomach removed      SOCIAL HISTORY:  Social History   Socioeconomic History  . Marital status: Widowed    Spouse name: Not on file  . Number of children: 2  . Years of education: 44  . Highest education level: 12th grade  Occupational History  . Occupation: retired from Estate manager/land agent: RETIRED  Tobacco Use  . Smoking status: Current Some Day Smoker    Packs/day: 0.50    Years: 60.00    Pack years: 30.00    Types: Cigarettes    Last attempt to quit: 09/09/2016    Years since quitting: 3.0  . Smokeless tobacco: Never Used  Substance and Sexual Activity  . Alcohol use: No  . Drug use: No  . Sexual activity: Not Currently  Other Topics Concern  . Not on file  Social  History Narrative   Lives with son alone    Social Determinants of Health   Financial Resource Strain:   . Difficulty of Paying Living Expenses:   Food Insecurity:   . Worried About Charity fundraiser in the Last Year:   . Arboriculturist in the Last Year:   Transportation Needs:   . Film/video editor (Medical):   Marland Kitchen Lack of Transportation (Non-Medical):   Physical Activity:   . Days of Exercise per Week:   . Minutes of Exercise per Session:   Stress:   . Feeling of Stress :   Social Connections:   . Frequency of Communication with Friends and Family:   . Frequency of Social Gatherings with Friends and Family:   . Attends Religious Services:   . Active Member of Clubs or Organizations:   . Attends Archivist Meetings:   Marland Kitchen Marital Status:   Intimate Partner Violence:   . Fear of Current or Ex-Partner:   . Emotionally Abused:   Marland Kitchen Physically Abused:   . Sexually Abused:     FAMILY HISTORY:  Family History  Problem Relation Age of Onset  . Hypertension Mother   . Kidney failure Brother        on dialysis  . Diabetes Sister   . Diabetes Sister   . Hypertension Father   . Kidney failure Sister   . Kidney Stones Brother   . Breast cancer Son 18  . Gout Son 23  . Gout Nephew 57  . Liver disease Neg Hx   . Colon cancer Neg Hx     CURRENT MEDICATIONS:  Outpatient Encounter Medications as of 09/22/2019  Medication Sig  . amLODipine (NORVASC) 10 MG tablet TAKE 1 TABLET DAILY  . CISPLATIN IV Inject into the vein. Days 1, 8 every 21 days  . clobetasol (TEMOVATE) 0.05 % external solution APP AA ON SCALP BID PRN NOT TO FACE GROIN OR UNDERARMS  . clonazePAM (KLONOPIN) 0.5 MG tablet Take 1 tablet (0.5 mg total) by mouth at bedtime.  . cycloSPORINE (RESTASIS) 0.05 % ophthalmic emulsion Place 1 drop into both eyes 2 (two) times daily.  . ergocalciferol (VITAMIN D2) 1.25 MG (50000 UT) capsule Take 1 capsule (50,000 Units total) by mouth once a week.  .  fluorometholone (FML) 0.1 % ophthalmic suspension INSTILL 1 DROP INTO EACH EYE THREE TIMES DAILY FOR 14 DAYS  . fluticasone (CUTIVATE) 0.05 % cream APPLY TO THE AFFECTED AREA FACE TWICE DAILY AS NEEDED  . fluticasone (FLONASE) 50 MCG/ACT nasal spray Place 1  spray into both nostrils daily.  Marland Kitchen GEMCITABINE HCL IV Inject into the vein. Days 1, 8 every 21 days  . metoprolol tartrate (LOPRESSOR) 25 MG tablet TAKE 1 TABLET DAILY FOR BLOOD PRESSURE (DOSE REDUCTION)  . mirtazapine (REMERON) 15 MG tablet Take 15 mg by mouth at bedtime.  Marland Kitchen oxybutynin (DITROPAN-XL) 5 MG 24 hr tablet TAKE 1 TABLET(5 MG) BY MOUTH AT BEDTIME  . pantoprazole (PROTONIX) 20 MG tablet TAKE 1 TABLET DAILY  . senna-docusate (SENOKOT-S) 8.6-50 MG tablet Take 1 tablet by mouth daily.  Marland Kitchen acetaminophen (TYLENOL) 500 MG tablet Take 500 mg by mouth every 6 (six) hours as needed for mild pain or moderate pain.   Marland Kitchen lidocaine (XYLOCAINE) 2 % solution RINSE WITH 5ML AS NEEDED TO RELIEVE MOUTH PAIN.  Marland Kitchen lidocaine-prilocaine (EMLA) cream Apply 1 application topically as needed. Apply to portacath site as needed (Patient not taking: Reported on 09/22/2019)  . loperamide (IMODIUM A-D) 2 MG tablet Take 2 mg by mouth 4 (four) times daily as needed for diarrhea or loose stools.  . [DISCONTINUED] prochlorperazine (COMPAZINE) 10 MG tablet Take 1 tablet (10 mg total) by mouth every 6 (six) hours as needed (Nausea or vomiting).   No facility-administered encounter medications on file as of 09/22/2019.    ALLERGIES:  Allergies  Allergen Reactions  . Iohexol Hives and Shortness Of Breath    **Patient can not have IV contrast, pt has breakthrough reaction even after 13 hour premeds** Do not give IV contrast PATIENT HAD TO BE TAKEN TO ED, C/O WHELPS, HIVES, DIFFICULTY BREATHING. PATIENT GIVEN IV CONTRAST AFTER 13 HOUR PRE MEDS ON 01/09/2017 WITH NO REACTION NOTED. On 10/19/17 patient had reaction despite premedication.    Marland Kitchen Aciphex [Rabeprazole Sodium] Other  (See Comments)    unknown  . Amlodipine Besylate-Valsartan     Rash to high-dose (5/320)  . Esomeprazole Magnesium   . Omeprazole Other (See Comments)    Patient states "medication didn't work"  . Ciprofloxacin Rash  . Penicillins Swelling and Rash    Has patient had a PCN reaction causing immediate rash, facial/tongue/throat swelling, SOB or lightheadedness with hypotension: Yes Has patient had a PCN reaction causing severe rash involving mucus membranes or skin necrosis: Yes Has patient had a PCN reaction that required hospitalization: No Has patient had a PCN reaction occurring within the last 10 years: yes If all of the above answers are "NO", then may proceed with Cephalosporin use.   [name] [Benzonatate] Itching    Full head, face, neck, and chest itching.      PHYSICAL EXAM:  ECOG Performance status: 1  Vitals:   09/22/19 0822  BP: (!) 178/65  Pulse: 70  Resp: 18  Temp: (!) 97 F (36.1 C)  SpO2: 100%   Filed Weights   09/22/19 0822  Weight: 104 lb 9.6 oz (47.4 kg)    Physical Exam Vitals reviewed.  Constitutional:      Appearance: Normal appearance.  Cardiovascular:     Rate and Rhythm: Normal rate and regular rhythm.     Heart sounds: Normal heart sounds.  Pulmonary:     Effort: Pulmonary effort is normal.     Breath sounds: Normal breath sounds.  Abdominal:     General: There is no distension.     Palpations: Abdomen is soft. There is no mass.  Musculoskeletal:        General: No swelling.  Skin:    General: Skin is warm.  Neurological:  General: No focal deficit present.     Mental Status: She is alert and oriented to person, place, and time.  Psychiatric:        Mood and Affect: Mood normal.        Behavior: Behavior normal.      LABORATORY DATA:  I have reviewed the labs as listed.  CBC    Component Value Date/Time   WBC 7.4 09/22/2019 0833   RBC 3.00 (L) 09/22/2019 0833   HGB 9.2 (L) 09/22/2019 0833   HCT 28.2 (L)  09/22/2019 0833   PLT 168 09/22/2019 0833   MCV 94.0 09/22/2019 0833   MCH 30.7 09/22/2019 0833   MCHC 32.6 09/22/2019 0833   RDW 16.6 (H) 09/22/2019 0833   LYMPHSABS 1.2 09/22/2019 0833   MONOABS 0.5 09/22/2019 0833   EOSABS 0.2 09/22/2019 0833   BASOSABS 0.0 09/22/2019 0833   CMP Latest Ref Rng & Units 09/22/2019 09/15/2019 09/01/2019  Glucose 70 - 99 mg/dL 110(H) 157(H) 105(H)  BUN 8 - 23 mg/dL 28(H) 26(H) 22  Creatinine 0.44 - 1.00 mg/dL 1.52(H) 1.39(H) 1.43(H)  Sodium 135 - 145 mmol/L 138 139 140  Potassium 3.5 - 5.1 mmol/L 3.9 3.6 3.6  Chloride 98 - 111 mmol/L 106 105 106  CO2 22 - 32 mmol/L _0 Calcium 8.9 - 10.3 mg/dL 8.2(L) 8.2(L) 8.1(L)  Total Protein 6.5 - 8.1 g/dL 7.5 7.4 7.2  Total Bilirubin 0.3 - 1.2 mg/dL 0.8 0.9 0.5  Alkaline Phos 38 - 126 U/L 254(H) 180(H) 212(H)  AST 15 - 41 U/L 93(H) 34 16  ALT 0 - 44 U/L 96(H) 26 11       DIAGNOSTIC IMAGING:  I have independently reviewed the scans with the patient.   I have reviewed Venita Lick LPN's note and agree with the documentation.  I personally performed a face-to-face visit, made revisions and my assessment and plan is as follows.    ASSESSMENT & PLAN:   Pancreatic cancer metastasized to liver ([place]) 1.  Metastatic pancreatic cancer to the liver: -7 cycles of gemcitabine and cisplatin day 1 and day 15 from 02/10/2019 through 08/18/2019. -CA 19-9 on 08/09/2019 was 253.  It was trending up last couple of times. -We had to hold her chemotherapy because of weight loss. -We reviewed results of the CT CAP from 09/16/2019.  Mass in the posterior liver dome measures 4.6 x 3.6 cm compared to 4.0 x 3.2 cm on 06/09/2019.  Left lobe mass measures 2.7 x 2.5 cm compared to 2.4 x 1.9 cm.  No new hepatic lesions.  Study limited by lack of IV contrast.  No new areas of metastatic disease. -We reviewed her labs.  AST and ALT are elevated.  She apparently took Tylenol 2 days ago for back pain. -Even though there is slight  increase in the liver masses, does not qualify for progression based on RECIST criteria.  Hence we will continue with current therapy as there is no options.  I have reviewed all her previous chemotherapy regimens.  We have also talked about best supportive care.  However patient would like to try some active treatments.  However her creatinine has increased to 1.53 today.  I would give her IV fluids with electrolytes today.  We will reevaluate her in 1 week to see if we can restart therapy.  2.  Renal insufficiency: -Baseline creatinine is around 1.3.  3.  Hypomagnesemia: -Magnesium is 1.7 and normal.  No supplements needed.  4.  Vitamin  D deficiency: -Continue vitamin D 50,000 units weekly.  5.  Overactive bladder: -Continue Ditropan XL 5 mg at nighttime.  Will titrate up as needed.  6.  Weight loss: -She cannot tolerate Ensure or boost.  She is eating 3 small meals per day.  She will continue Remeron 15 mg at bedtime. -She lost 2 pounds in the last 4 weeks.     Orders placed this encounter:  Orders Placed This Encounter  Procedures  . CBC with Differential/Platelet  . Comprehensive metabolic panel  . Magnesium  . Cancer antigen 19-9   Total time spent is 40 minutes with more than 50% of the time spent face-to-face discussing scan results, reviewing treatment plan and other options, counseling and coordination of care.  Derek Jack, MD [place] [phone]

## 2019-09-22 NOTE — Patient Instructions (Signed)
[facility] at [facility]  Discharge Instructions:   _______________________________________________________________  Thank you for choosing [facility] at [facility] to provide your oncology and hematology care.  To afford each patient quality time with our providers, please arrive at least 15 minutes before your scheduled appointment.  You need to re-schedule your appointment if you arrive 10 or more minutes late.  We strive to give you quality time with our providers, and arriving late affects you and other patients whose appointments are after yours.  Also, if you no show three or more times for appointments you may be dismissed from the clinic.  Again, thank you for choosing  [facility]ospital. Our hope is that these requests will allow you access to exceptional care and in a timely manner. _______________________________________________________________  If you have questions after your visit, please contact our office at [phone] between the hours of 8:30 a.m. and 5:00 p.m. Voicemails left after 4:30 p.m. will not be returned until the following business day. _______________________________________________________________  For prescription refill requests, have your pharmacy contact our office. _______________________________________________________________  Recommendations made by the consultant and any test results will be sent to your referring physician. _______________________________________________________________ 

## 2019-09-22 NOTE — Assessment & Plan Note (Addendum)
1.  Metastatic pancreatic cancer to the liver: -7 cycles of gemcitabine and cisplatin day 1 and day 15 from 02/10/2019 through 08/18/2019. -CA 19-9 on 08/09/2019 was 253.  It was trending up last couple of times. -We had to hold her chemotherapy because of weight loss. -We reviewed results of the CT CAP from 09/16/2019.  Mass in the posterior liver dome measures 4.6 x 3.6 cm compared to 4.0 x 3.2 cm on 06/09/2019.  Left lobe mass measures 2.7 x 2.5 cm compared to 2.4 x 1.9 cm.  No new hepatic lesions.  Study limited by lack of IV contrast.  No new areas of metastatic disease. -We reviewed her labs.  AST and ALT are elevated.  She apparently took Tylenol 2 days ago for back pain. -Even though there is slight increase in the liver masses, does not qualify for progression based on RECIST criteria.  Hence we will continue with current therapy as there is no options.  I have reviewed all her previous chemotherapy regimens.  We have also talked about best supportive care.  However patient would like to try some active treatments.  However her creatinine has increased to 1.53 today.  I would give her IV fluids with electrolytes today.  We will reevaluate her in 1 week to see if we can restart therapy.  2.  Renal insufficiency: -Baseline creatinine is around 1.3.  3.  Hypomagnesemia: -Magnesium is 1.7 and normal.  No supplements needed.  4.  Vitamin D deficiency: -Continue vitamin D 50,000 units weekly.  5.  Overactive bladder: -Continue Ditropan XL 5 mg at nighttime.  Will titrate up as needed.  6.  Weight loss: -She cannot tolerate Ensure or boost.  She is eating 3 small meals per day.  She will continue Remeron 15 mg at bedtime. -She lost 2 pounds in the last 4 weeks.

## 2019-09-22 NOTE — Patient Instructions (Signed)
[facility] at [facility] Discharge Instructions  Labs drawn from portacath   Thank you for choosing [facility] at [facility] to provide your oncology and hematology care.  To afford each patient quality time with our provider, please arrive at least 15 minutes before your scheduled appointment time.   If you have a lab appointment with the Cancer Center please come in thru the Main Entrance and check in at the main information desk.  You need to re-schedule your appointment should you arrive 10 or more minutes late.  We strive to give you quality time with our providers, and arriving late affects you and other patients whose appointments are after yours.  Also, if you no show three or more times for appointments you may be dismissed from the clinic at the providers discretion.     Again, thank you for choosing [facility].  Our hope is that these requests will decrease the amount of time that you wait before being seen by our physicians.       _____________________________________________________________  Should you have questions after your visit to [facility], please contact our office at [phone] between the hours of 8:00 a.m. and 4:30 p.m.  Voicemails left after 4:00 p.m. will not be returned until the following business day.  For prescription refill requests, have your pharmacy contact our office and allow 72 hours.    Due to Covid, you will need to wear a mask upon entering the hospital. If you do not have a mask, a mask will be given to you at the Main Entrance upon arrival. For doctor visits, patients may have 1 support person with them. For treatment visits, patients can not have anyone with them due to social distancing guidelines and our immunocompromised population.     

## 2019-09-22 NOTE — Progress Notes (Signed)
Patient presents today for treatment and follow up visit with Dr. Delton Coombes. Labs reviewed by MD.  Vital signs stable.   Message received from [facility] LPN/ Dr. Delton Coombes. NO treatment today. Infuse house fluids over two hours.   Hydration ( House fluids) given today per MD orders. Tolerated infusion without adverse affects. Vital signs stable. No complaints at this time. Discharged from clinic ambulatory. F/U with [facility] as scheduled.

## 2019-09-22 NOTE — Patient Instructions (Addendum)
[name] at [facility] Discharge Instructions  You were seen today by Dr. Delton Coombes. He went over your recent lab results. Your kidney and liver numbers are up today, he will give you fluids to help with those.Your scan showed some growth but enough growth to say that you have had progression. There are no new areas found on the scan at this time. He discussed your treatment options with you and what you think would be best for you. To continue with the treatment you will need to increase your weight and nutrition. He will see you back in 1 week for labs and follow up.   Thank you for choosing [place] at [facility] to provide your oncology and hematology care.  To afford each patient quality time with our [facility], please arrive at least 15 minutes before your scheduled appointment time.   If you have a lab appointment with the [facility] please come in thru the  Main Entrance and check in at the main information desk  You need to re-schedule your appointment should you arrive 10 or more minutes late.  We strive to give you quality time with our providers, and arriving late affects you and other patients whose appointments are after yours.  Also, if you no show three or more times for appointments you may be dismissed from the clinic at the providers discretion.     Again, thank you for choosing [facility].  Our hope is that these requests will decrease the amount of time that you wait before being seen by our physicians.       _____________________________________________________________  Should you have questions after your visit to [facility], please contact our office at [phone] between the hours of 8:00 a.m. and 4:30 p.m.  Voicemails left after 4:00 p.m. will not be returned until the following business day.  For prescription refill requests, have your pharmacy contact our office and allow 72 hours.     Cancer Center Support Programs:   > Cancer Support Group  2nd Tuesday of the month 1pm-2pm, Journey Room

## 2019-09-23 ENCOUNTER — Ambulatory Visit (HOSPITAL_COMMUNITY): Payer: Medicare HMO

## 2019-09-28 NOTE — Progress Notes (Signed)
[name], [place] [address]   CLINIC:  Medical Oncology/Hematology  PCP:  Fayrene Helper, MD [address] [place] / [place] [address] [phone]   REASON FOR VISIT:  Follow-up for metastatic pancreatic cancer to the liver  CURRENT THERAPY:  cisplatin and gamcitabine  BRIEF ONCOLOGIC HISTORY:  Oncology History  Pancreatic cancer metastasized to liver ([place])  03/10/2014 Initial Diagnosis   Pancreatic cancer metastasized to liver   03/22/2014 - 03/05/2016 Chemotherapy   Abraxane/Gemzar days 1, 8, every 28 days.  Day 15 was cancelled due to leukopenia and thrombocytopenia on day 15 cycle 1.   05/24/2014 Treatment Plan Change   Day 8 of cycle 3 is held with ANC of 1.1   06/05/2014 Imaging   CT C/A/P . Interval decrease in size of the pancreatic tail mass.Improved hepatic metastatic disease. No new lesions. No CT findings for metastatic disease involving the chest.   08/09/2014 Tumor Marker   CA 19-9= 33 (WNL)   10/26/2014 Imaging   MRI- Continued interval decrease in size of the hepatic metastatic lesions and no new lesions are identified. Continued decrease in size of the pancreatic tail lesion.   01/03/2015 Tumor Marker   Results for [name], [name] (MRN [id number]) as of 01/18/2015 12:52  01/03/2015 10:00 CA 19-9: 14    01/17/2015 Imaging   MRI- Response to therapy of hepatic metastasis.  Similar size of a pancreatic tail lesion.   03/20/2015 Imaging   MRI-L spine- Severe disc space narrowing at L2-L3, with endplate reactive changes. Large disc extrusion into the ventral epidural space,central to the RIGHT with a cephalad migrated free fragment. Additional Large disc extrusion into the retroperitone...   03/22/2015 Imaging   CT pelvis- No evidence for metastatic disease within the pelvis.   07/24/2015 Imaging   MRI abd- Continued response to therapy, with no residual detectable liver metastases. No new sites of metastatic  disease in the abdomen.    08/15/2015 Code Status    She confirms desire for DNR status.   12/21/2015 Imaging   MRI liver- Severe image degradation due to motion artifact reducing diagnostic sensitivity and specificity. Reduce conspicuity of the pancreatic tail lesion suggesting further improvement. The original liver lesions have resolved.   03/05/2016 Treatment Plan Change   Chemotherapy holiday/Break after 2 years worth of treatment   04/07/2016 Imaging   CT chest- When compared to recent chest CT, new minimally displaced anterior left sixth rib fracture. Slight increase in subcarinal adenopathy   04/28/2016 Imaging   Bone density- BMD as determined from Femur Neck Right is 0.705 g/cm2 with a T-Score of -2.4. This patient is considered osteopenic according to [facility]) criteria. Compared with the prior study on 02/23/2013, the BMD of the lumbar spine/rt. femoral neck show a statistically significant decrease.    05/23/2016 Imaging   MRI abd- 1. Exam is significantly degraded by patient respiratory motion. Consider follow-up exams with CT abdomen with without contrast per pancreatic protocol. 2. Fullness in tail of pancreas is more prominent and potentially increased in size. Recommend close attention on follow-up. (Consider CT as above) 3. No explanation for back pain.   09/11/2016 Imaging   MRI pancreas- Interval progression of pancreatic tail lesion. New differential perfusion right liver with areas of heterogeneity. Underlying metastatic disease in this region not excluded.   09/11/2016 Progression   MRi in conjunction with rising CA 19-9 are indicative of relapse of disease.   01/09/2017 Progression  CT C/A/P: 2.1 x 2.9 cm lesion in the pancreatic tail and abutting the splenic hilum, corresponding to known primary pancreatic neoplasm, mildly increased.  Scattered small hypoenhancing lesions in the liver measuring up to 10 mm, approximately 8-10 in  number, suspicious for hepatic metastases.  No findings specific for metastatic disease in the chest.  Additional ancillary findings as above.    01/27/2017 - 10/20/2017 Chemotherapy   The patient had gemcitabine (GEMZAR) 1,292 mg in sodium chloride 0.9 % 250 mL chemo infusion, 800 mg/m2 = 1,292 mg, Intravenous,  Once, 9 of 12 cycles Administration: 1,292 mg (01/28/2017), 1,292 mg (02/11/2017), 1,292 mg (02/25/2017), 1,292 mg (03/11/2017), 1,292 mg (03/25/2017), 1,292 mg (04/08/2017), 1,292 mg (04/22/2017), 1,292 mg (04/29/2017), 1,292 mg (05/20/2017), 1,292 mg (06/03/2017), 1,292 mg (06/24/2017), 1,292 mg (07/08/2017), 1,292 mg (07/29/2017), 1,292 mg (08/12/2017), 1,292 mg (08/26/2017), 1,292 mg (09/09/2017), 1,292 mg (09/23/2017), 1,292 mg (10/07/2017)  for chemotherapy treatment.    10/21/2017 - 09/22/2018 Chemotherapy   The patient had leucovorin 700 mg in dextrose 5 % 250 mL infusion, 644 mg, Intravenous,  Once, 22 of 22 cycles Administration: 700 mg (10/21/2017), 700 mg (11/04/2017), 700 mg (11/18/2017), 700 mg (12/02/2017), 700 mg (12/16/2017), 700 mg (12/30/2017), 700 mg (01/13/2018), 700 mg (01/27/2018), 700 mg (02/10/2018), 700 mg (02/24/2018), 700 mg (03/10/2018), 700 mg (03/24/2018), 700 mg (04/28/2018), 700 mg (05/17/2018), 700 mg (05/31/2018), 700 mg (06/16/2018), 700 mg (06/30/2018), 700 mg (07/21/2018), 700 mg (08/04/2018), 700 mg (08/25/2018), 700 mg (09/08/2018) ondansetron (ZOFRAN) 8 mg in sodium chloride 0.9 % 50 mL IVPB, , Intravenous,  Once, 21 of 21 cycles Administration:  (11/04/2017),  (01/27/2018),  (02/10/2018),  (02/24/2018),  (03/10/2018),  (03/24/2018),  (04/28/2018),  (05/17/2018),  (05/31/2018),  (06/16/2018),  (06/30/2018),  (07/21/2018),  (08/04/2018),  (08/25/2018),  (09/08/2018) fluorouracil (ADRUCIL) 3,850 mg in sodium chloride 0.9 % 73 mL chemo infusion, 2,400 mg/m2 = 3,850 mg, Intravenous, 1 Day/Dose, 22 of 22 cycles Dose modification: 1,920 mg/m2 (original dose 2,400 mg/m2, Cycle 2, Reason: Provider  Judgment, Comment: mucisitis) Administration: 3,850 mg (10/21/2017), 3,100 mg (11/04/2017), 3,100 mg (11/18/2017), 3,100 mg (12/02/2017), 3,100 mg (12/16/2017), 3,100 mg (12/30/2017), 3,100 mg (01/13/2018), 3,100 mg (01/27/2018), 3,100 mg (02/10/2018), 3,100 mg (02/24/2018), 3,100 mg (03/10/2018), 3,100 mg (03/24/2018), 3,100 mg (04/28/2018), 3,100 mg (05/17/2018), 3,100 mg (05/31/2018), 3,100 mg (06/16/2018), 3,100 mg (06/30/2018), 3,100 mg (07/21/2018), 3,100 mg (08/04/2018), 3,100 mg (08/25/2018), 3,100 mg (09/08/2018) irinotecan LIPOSOME (ONIVYDE) 81.7 mg in sodium chloride 0.9 % 500 mL chemo infusion, 50 mg/m2 = 81.7 mg (100 % of original dose 50 mg/m2), Intravenous, Once, 22 of 22 cycles Dose modification: 50 mg/m2 (original dose 50 mg/m2, Cycle 1, Reason: Provider Judgment), 50 mg/m2 (original dose 50 mg/m2, Cycle 2, Reason: Provider Judgment) Administration: 81.7 mg (10/21/2017), 81.7 mg (11/04/2017), 81.7 mg (11/18/2017), 81.7 mg (12/02/2017), 81.7 mg (12/16/2017), 81.7 mg (12/30/2017), 81.7 mg (01/13/2018), 81.7 mg (01/27/2018), 81.7 mg (02/10/2018), 81.7 mg (02/24/2018), 81.7 mg (03/10/2018), 81.7 mg (03/24/2018), 81.7 mg (04/28/2018), 81.7 mg (05/17/2018), 81.7 mg (05/31/2018), 81.7 mg (06/16/2018), 81.7 mg (06/30/2018), 81.7 mg (07/21/2018), 81.7 mg (08/04/2018), 81.7 mg (08/25/2018), 81.7 mg (09/08/2018)  for chemotherapy treatment.    09/22/2018 - 01/11/2019 Chemotherapy   The patient had palonosetron (ALOXI) injection 0.25 mg, 0.25 mg, Intravenous,  Once, 7 of 8 cycles Administration: 0.25 mg (09/22/2018), 0.25 mg (10/11/2018), 0.25 mg (10/25/2018), 0.25 mg (12/01/2018), 0.25 mg (12/15/2018), 0.25 mg (11/17/2018), 0.25 mg (12/29/2018) pegfilgrastim (NEULASTA) injection 6 mg, 6 mg, Subcutaneous, Once, 5 of 5 cycles Administration: 6 mg (10/13/2018), 6 mg (  10/27/2018), 6 mg (12/03/2018), 6 mg (12/17/2018), 6 mg (11/19/2018) pegfilgrastim-cbqv (UDENYCA) injection 6 mg, 6 mg, Subcutaneous, Once, 1 of 2 cycles Administration: 6 mg  (12/31/2018) leucovorin 628 mg in dextrose 5 % 250 mL infusion, 400 mg/m2 = 628 mg, Intravenous,  Once, 7 of 8 cycles Administration: 628 mg (09/22/2018), 628 mg (10/11/2018), 628 mg (10/25/2018), 628 mg (12/01/2018), 628 mg (12/15/2018), 628 mg (11/17/2018), 628 mg (12/29/2018) oxaliplatin (ELOXATIN) 100 mg in dextrose 5 % 500 mL chemo infusion, 65 mg/m2 = 100 mg (100 % of original dose 65 mg/m2), Intravenous,  Once, 7 of 8 cycles Dose modification: 65 mg/m2 (original dose 65 mg/m2, Cycle 1, Reason: Provider Judgment) Administration: 100 mg (09/22/2018), 100 mg (10/11/2018), 100 mg (10/25/2018), 100 mg (12/01/2018), 100 mg (12/15/2018), 100 mg (11/17/2018), 100 mg (12/29/2018) fluorouracil (ADRUCIL) chemo injection 500 mg, 320 mg/m2 = 500 mg (100 % of original dose 320 mg/m2), Intravenous,  Once, 7 of 8 cycles Dose modification: 320 mg/m2 (original dose 320 mg/m2, Cycle 1, Reason: Provider Judgment) Administration: 500 mg (09/22/2018), 500 mg (10/11/2018), 500 mg (10/25/2018), 500 mg (12/01/2018), 500 mg (12/15/2018), 500 mg (11/17/2018), 500 mg (12/29/2018) fluorouracil (ADRUCIL) 3,000 mg in sodium chloride 0.9 % 90 mL chemo infusion, 1,920 mg/m2 = 3,000 mg (100 % of original dose 1,920 mg/m2), Intravenous, 1 Day/Dose, 7 of 8 cycles Dose modification: 1,920 mg/m2 (original dose 1,920 mg/m2, Cycle 1, Reason: Provider Judgment) Administration: 3,000 mg (09/22/2018), 3,000 mg (10/11/2018), 3,000 mg (10/25/2018), 3,000 mg (12/01/2018), 3,000 mg (12/15/2018), 3,000 mg (11/17/2018), 3,000 mg (12/29/2018)  for chemotherapy treatment.    12/24/2018 Genetic Testing   Negative genetic testing on the common hereditary cancer panel.  The Common Hereditary Gene Panel offered by [facility] includes sequencing and/or deletion duplication testing of the following 48 genes: APC, ATM, AXIN2, BARD1, BMPR1A, BRCA1, BRCA2, BRIP1, CDH1, CDK4, CDKN2A (p14ARF), CDKN2A (p16INK4a), CHEK2, CTNNA1, DICER1, EPCAM (Deletion/duplication testing only), GREM1 (promoter region  deletion/duplication testing only), KIT, MEN1, MLH1, MSH2, MSH3, MSH6, MUTYH, NBN, NF1, NHTL1, PALB2, PDGFRA, PMS2, POLD1, POLE, PTEN, RAD50, RAD51C, RAD51D, RNF43, SDHB, SDHC, SDHD, SMAD4, SMARCA4. STK11, TP53, TSC1, TSC2, and VHL.  The following genes were evaluated for sequence changes only: SDHA and HOXB13 c.251G>A variant only. The report date is December 24, 2018.  DICER1 VUS identified.  This will not change medical decision making.   02/10/2019 -  Chemotherapy   The patient had palonosetron (ALOXI) injection 0.25 mg, 0.25 mg, Intravenous,  Once, 9 of 11 cycles Administration: 0.25 mg (02/10/2019), 0.25 mg (02/24/2019), 0.25 mg (03/10/2019), 0.25 mg (03/17/2019), 0.25 mg (03/31/2019), 0.25 mg (04/14/2019), 0.25 mg (05/12/2019), 0.25 mg (05/30/2019), 0.25 mg (06/14/2019), 0.25 mg (06/28/2019), 0.25 mg (07/13/2019), 0.25 mg (08/04/2019), 0.25 mg (08/18/2019), 0.25 mg (09/29/2019) pegfilgrastim-jmdb (FULPHILA) injection 6 mg, 6 mg, Subcutaneous,  Once, 8 of 10 cycles Administration: 6 mg (02/25/2019), 6 mg (03/18/2019), 6 mg (05/31/2019), 6 mg (07/14/2019), 6 mg (08/05/2019), 6 mg (08/19/2019) pegfilgrastim-cbqv (UDENYCA) injection 6 mg, 6 mg, Subcutaneous, Once, 1 of 1 cycle Administration: 6 mg (06/29/2019) CISplatin (PLATINOL) 31 mg in sodium chloride 0.9 % 250 mL chemo infusion, 20 mg/m2 = 31 mg (80 % of original dose 25 mg/m2), Intravenous,  Once, 8 of 10 cycles Dose modification: 20 mg/m2 (80 % of original dose 25 mg/m2, Cycle 1, Reason: Other (see comments), Comment: renal dysfunction) Administration: 31 mg (02/10/2019), 31 mg (02/24/2019), 31 mg (03/10/2019), 31 mg (03/17/2019), 31 mg (03/31/2019), 31 mg (04/14/2019), 31 mg (05/30/2019), 31 mg (06/14/2019), 31 mg (06/28/2019), 31 mg (  07/13/2019), 31 mg (08/04/2019), 31 mg (08/18/2019) gemcitabine (GEMZAR) 1,140 mg in sodium chloride 0.9 % 250 mL chemo infusion, 750 mg/m2 = 1,140 mg (75 % of original dose 1,000 mg/m2), Intravenous,  Once, 9 of 11 cycles Dose modification: 750  mg/m2 (75 % of original dose 1,000 mg/m2, Cycle 1, Reason: Provider Judgment) Administration: 1,140 mg (02/10/2019), 1,140 mg (02/24/2019), 1,140 mg (03/10/2019), 1,140 mg (03/17/2019), 1,140 mg (03/31/2019), 1,140 mg (04/14/2019), 1,140 mg (05/12/2019), 1,140 mg (05/30/2019), 1,140 mg (06/14/2019), 1,140 mg (06/28/2019), 1,140 mg (07/13/2019), 1,140 mg (08/04/2019), 1,140 mg (08/18/2019), 1,140 mg (09/29/2019) fosaprepitant (EMEND) 150 mg, dexamethasone (DECADRON) 12 mg in sodium chloride 0.9 % 145 mL IVPB, , Intravenous,  Once, 9 of 11 cycles Administration:  (02/10/2019),  (02/24/2019),  (03/10/2019),  (03/17/2019),  (03/31/2019),  (04/14/2019),  (05/12/2019),  (05/30/2019),  (06/14/2019),  (06/28/2019),  (07/13/2019),  (08/04/2019),  (08/18/2019)  for chemotherapy treatment.      CANCER STAGING: Cancer Staging Pancreatic cancer metastasized to liver [facility]) Staging form: Pancreas, AJCC 7th Edition - Clinical: Stage IV (T3, N1, M1) - Signed by Baird Cancer, PA-C on 03/16/2014 - Pathologic: No stage assigned - Unsigned   INTERVAL HISTORY:  Ms. October Peery, a 86 y.o. female, returns for routine follow-up of her metastatic pancreatic cancer to the liver. Lumen was last seen on 09/22/2019.   Today is cycle 9 of cisplatin and gamcitabine.   Sh notes that she has had a loss of appetite, mostly because food doesn't taste good to her. All foods are off to her. She tried drinking an Ensure, but it make her stomach hurt. She has a boost with her today to try. She is having significant left lateral chest wall pain today.   REVIEW OF SYSTEMS:  Review of Systems  Constitutional: Positive for appetite change (moderately decreased) and fatigue (moderate). Negative for chills, diaphoresis and fever.       "Food doesn't taste right"  HENT:   Negative for mouth sores, sore throat and trouble swallowing.   Eyes: Negative for eye problems.  Respiratory: Negative for cough, shortness of breath and wheezing.     Cardiovascular: Negative for chest pain, leg swelling and palpitations.  Gastrointestinal: Positive for nausea and vomiting. Negative for abdominal pain, constipation and diarrhea.  Genitourinary: Negative for bladder incontinence, dysuria and frequency.   Musculoskeletal: Negative for arthralgias, back pain and myalgias.  Skin: Negative for rash.  Neurological: Negative for dizziness, extremity weakness, headaches and numbness.  Hematological: Does not bruise/bleed easily.  Psychiatric/Behavioral: Negative for depression and sleep disturbance. The patient is not nervous/anxious.     PAST MEDICAL/SURGICAL HISTORY:  Past Medical History:  Diagnosis Date  . Anemia due to antineoplastic chemotherapy 09/12/2015   Started Aranesp 500 mcg on 09/12/2015  . Anxiety   . Chronic diarrhea   . Depression   . DNR (do not resuscitate) 08/16/2015  . Erosive esophagitis   . Family history of breast cancer in female   . Family history of prostate cancer   . GERD (gastroesophageal reflux disease)   . Hx of adenomatous colonic polyps    tubular adenomas, last found in 2008  . Hyperplastic colon polyp 03/19/10   tcs by Dr. Gala Romney  . Hypertension 20 years   . Kidney stone    hx/ crushed   . Opioid contract exists 04/18/2015   With Dr. Moshe Cipro  . Pancreatic cancer ([place]) 02/2014  . Pancreatic cancer metastasized to liver ([place]) 03/10/2014  . Schatzki's ring 08/26/10   Last dilated on  EGD by Dr. Trevor Iha [facility], linear gastric erosions, BI hemigastrectomy   Past Surgical History:  Procedure Laterality Date  . BREAST LUMPECTOMY Left   . bunion removal     from both feet   . CHOLECYSTECTOMY  1965   . COLONOSCOPY  03/19/2010   DR Gala Romney,, normal TI, pancolonic diverticula, random colon bx neg., hyperplastic polyps removed  . ESOPHAGOGASTRODUODENOSCOPY  11/22/2003   DR Gala Romney, erosive RE, Billroth I  . ESOPHAGOGASTRODUODENOSCOPY  08/26/10   Dr. Gala Romney- moderate severe ERE, Scahtzki ring s/p dilation, Billroth I,  linear gastric erosions, bx-gastric xanthelasma  . LEFT SHOULDER SURGERY  2009   DR HARRISON  . ORIF ANKLE FRACTURE Right 05/22/2013   Procedure: OPEN REDUCTION INTERNAL FIXATION (ORIF) RIGHT ANKLE FRACTURE;  Surgeon: Sanjuana Kava, MD;  Location: [facility];  Service: Orthopedics;  Laterality: Right;  . stomach ulcer  50 years ago    had some of her stomach removed     SOCIAL HISTORY:  Social History   Socioeconomic History  . Marital status: Widowed    Spouse name: Not on file  . Number of children: 2  . Years of education: 61  . Highest education level: 12th grade  Occupational History  . Occupation: retired from Estate manager/land agent: RETIRED  Tobacco Use  . Smoking status: Current Some Day Smoker    Packs/day: 0.50    Years: 60.00    Pack years: 30.00    Types: Cigarettes    Last attempt to quit: 09/09/2016    Years since quitting: 3.0  . Smokeless tobacco: Never Used  Substance and Sexual Activity  . Alcohol use: No  . Drug use: No  . Sexual activity: Not Currently  Other Topics Concern  . Not on file  Social History Narrative   Lives with son alone    Social Determinants of Health   Financial Resource Strain:   . Difficulty of Paying Living Expenses:   Food Insecurity:   . Worried About Charity fundraiser in the Last Year:   . Arboriculturist in the Last Year:   Transportation Needs:   . Film/video editor (Medical):   Marland Kitchen Lack of Transportation (Non-Medical):   Physical Activity:   . Days of Exercise per Week:   . Minutes of Exercise per Session:   Stress:   . Feeling of Stress :   Social Connections:   . Frequency of Communication with Friends and Family:   . Frequency of Social Gatherings with Friends and Family:   . Attends Religious Services:   . Active Member of Clubs or Organizations:   . Attends Archivist Meetings:   Marland Kitchen Marital Status:   Intimate Partner Violence:   . Fear of Current or Ex-Partner:   . Emotionally Abused:   Marland Kitchen  Physically Abused:   . Sexually Abused:     FAMILY HISTORY:  Family History  Problem Relation Age of Onset  . Hypertension Mother   . Kidney failure Brother        on dialysis  . Diabetes Sister   . Diabetes Sister   . Hypertension Father   . Kidney failure Sister   . Kidney Stones Brother   . Breast cancer Son 38  . Gout Son 23  . Gout Nephew 57  . Liver disease Neg Hx   . Colon cancer Neg Hx     CURRENT MEDICATIONS:  Current Outpatient Medications  Medication Sig Dispense Refill  . amLODipine (NORVASC)  10 MG tablet TAKE 1 TABLET DAILY 90 tablet 3  . CISPLATIN IV Inject into the vein. Days 1, 8 every 21 days    . clobetasol (TEMOVATE) 0.05 % external solution APP AA ON SCALP BID PRN NOT TO FACE GROIN OR UNDERARMS    . clonazePAM (KLONOPIN) 0.5 MG tablet Take 1 tablet (0.5 mg total) by mouth at bedtime. 90 tablet 1  . cycloSPORINE (RESTASIS) 0.05 % ophthalmic emulsion Place 1 drop into both eyes 2 (two) times daily.    . ergocalciferol (VITAMIN D2) 1.25 MG (50000 UT) capsule Take 1 capsule (50,000 Units total) by mouth once a week. 4 capsule 3  . fluorometholone (FML) 0.1 % ophthalmic suspension INSTILL 1 DROP INTO EACH EYE THREE TIMES DAILY FOR 14 DAYS  2  . fluticasone (FLONASE) 50 MCG/ACT nasal spray Place 1 spray into both nostrils daily.    Marland Kitchen GEMCITABINE HCL IV Inject into the vein. Days 1, 8 every 21 days    . lidocaine (XYLOCAINE) 2 % solution RINSE WITH 5ML AS NEEDED TO RELIEVE MOUTH PAIN.    . metoprolol tartrate (LOPRESSOR) 25 MG tablet TAKE 1 TABLET DAILY FOR BLOOD PRESSURE (DOSE REDUCTION) 90 tablet 3  . mirtazapine (REMERON) 15 MG tablet Take 15 mg by mouth at bedtime.    Marland Kitchen MYRBETRIQ 50 MG TB24 tablet Take 50 mg by mouth daily.    Marland Kitchen oxybutynin (DITROPAN-XL) 5 MG 24 hr tablet TAKE 1 TABLET(5 MG) BY MOUTH AT BEDTIME 30 tablet 0  . pantoprazole (PROTONIX) 20 MG tablet TAKE 1 TABLET DAILY 90 tablet 3  . senna-docusate (SENOKOT-S) 8.6-50 MG tablet Take 1 tablet by  mouth daily.    Marland Kitchen acetaminophen (TYLENOL) 500 MG tablet Take 500 mg by mouth every 6 (six) hours as needed for mild pain or moderate pain.     . fluticasone (CUTIVATE) 0.05 % cream APPLY TO THE AFFECTED AREA FACE TWICE DAILY AS NEEDED    . lidocaine-prilocaine (EMLA) cream Apply 1 application topically as needed. Apply to portacath site as needed (Patient not taking: Reported on 09/29/2019) 30 g 1  . loperamide (IMODIUM A-D) 2 MG tablet Take 2 mg by mouth 4 (four) times daily as needed for diarrhea or loose stools.    . traMADol (ULTRAM) 50 MG tablet Take 1 tablet (50 mg total) by mouth every 12 (twelve) hours as needed. 30 tablet 0   No current facility-administered medications for this visit.   Facility-Administered Medications Ordered in Other Visits  Medication Dose Route Frequency Provider Last Rate Last Admin  . sodium chloride flush (NS) 0.9 % injection 10 mL  10 mL Intracatheter PRN Derek Jack, MD   10 mL at 09/29/19 1238    ALLERGIES:  Allergies  Allergen Reactions  . Iohexol Hives and Shortness Of Breath    **Patient can not have IV contrast, pt has breakthrough reaction even after 13 hour premeds** Do not give IV contrast PATIENT HAD TO BE TAKEN TO ED, C/O WHELPS, HIVES, DIFFICULTY BREATHING. PATIENT GIVEN IV CONTRAST AFTER 13 HOUR PRE MEDS ON 01/09/2017 WITH NO REACTION NOTED. On 10/19/17 patient had reaction despite premedication.    Marland Kitchen Aciphex [Rabeprazole Sodium] Other (See Comments)    unknown  . Amlodipine Besylate-Valsartan     Rash to high-dose (5/320)  . Esomeprazole Magnesium   . Omeprazole Other (See Comments)    Patient states "medication didn't work"  . Ciprofloxacin Rash  . Penicillins Swelling and Rash    Has patient had a PCN reaction  causing immediate rash, facial/tongue/throat swelling, SOB or lightheadedness with hypotension: Yes Has patient had a PCN reaction causing severe rash involving mucus membranes or skin necrosis: Yes Has patient had a PCN  reaction that required hospitalization: No Has patient had a PCN reaction occurring within the last 10 years: yes If all of the above answers are "NO", then may proceed with Cephalosporin use.   [name] [Benzonatate] Itching    Full head, face, neck, and chest itching.     PHYSICAL EXAM:  Performance status (ECOG): 1 - Symptomatic but completely ambulatory  Vitals:   09/29/19 0824  BP: (!) 153/52  Pulse: (!) 51  Resp: 18  Temp: (!) 97.1 F (36.2 C)  SpO2: 100%   Wt Readings from Last 3 Encounters:  09/29/19 104 lb 12.8 oz (47.5 kg)  09/22/19 104 lb 9.6 oz (47.4 kg)  09/15/19 106 lb (48.1 kg)   Physical Exam Constitutional:      General: She is not in acute distress.    Appearance: Normal appearance. She is normal weight. She is not ill-appearing.  HENT:     Nose: No congestion or rhinorrhea.     Mouth/Throat:     Mouth: Mucous membranes are moist.     Pharynx: No oropharyngeal exudate or posterior oropharyngeal erythema.  Eyes:     Extraocular Movements: Extraocular movements intact.     Pupils: Pupils are equal, round, and reactive to light.  Cardiovascular:     Rate and Rhythm: Normal rate and regular rhythm.     Pulses: Normal pulses.     Heart sounds: Normal heart sounds. No murmur. No friction rub. No gallop.   Pulmonary:     Effort: Pulmonary effort is normal.     Breath sounds: Normal breath sounds. No wheezing, rhonchi or rales.  Abdominal:     Palpations: There is no mass.     Tenderness: There is no abdominal tenderness. There is no guarding.  Musculoskeletal:        General: No swelling or tenderness.     Right lower leg: No edema.     Left lower leg: No edema.  Skin:    Findings: No bruising or erythema.  Neurological:     Mental Status: She is alert and oriented to person, place, and time.     Sensory: No sensory deficit.  Psychiatric:        Mood and Affect: Mood normal.        Behavior: Behavior normal.        Thought Content:  Thought content normal.        Judgment: Judgment normal.      LABORATORY DATA:  I have reviewed the labs as listed.  CBC Latest Ref Rng & Units 09/29/2019 09/22/2019 09/15/2019  WBC 4.0 - 10.5 K/uL 5.8 7.4 8.2  Hemoglobin 12.0 - 15.0 g/dL 8.7(L) 9.2(L) 9.2(L)  Hematocrit 36.0 - 46.0 % 26.7(L) 28.2(L) 28.4(L)  Platelets 150 - 400 K/uL 162 168 182   CMP Latest Ref Rng & Units 09/29/2019 09/22/2019 09/15/2019  Glucose 70 - 99 mg/dL 116(H) 110(H) 157(H)  BUN 8 - 23 mg/dL 32(H) 28(H) 26(H)  Creatinine 0.44 - 1.00 mg/dL 1.54(H) 1.52(H) 1.39(H)  Sodium 135 - 145 mmol/L 140 138 139  Potassium 3.5 - 5.1 mmol/L 3.5 3.9 3.6  Chloride 98 - 111 mmol/L 106 106 105  CO2 22 - 32 mmol/L '24 22 24  ' Calcium 8.9 - 10.3 mg/dL 8.2(L) 8.2(L) 8.2(L)  Total Protein 6.5 - 8.1  g/dL 7.1 7.5 7.4  Total Bilirubin 0.3 - 1.2 mg/dL 1.0 0.8 0.9  Alkaline Phos 38 - 126 U/L 283(H) 254(H) 180(H)  AST 15 - 41 U/L 117(H) 93(H) 34  ALT 0 - 44 U/L 124(H) 96(H) 26    DIAGNOSTIC IMAGING:  I have independently reviewed the scans and discussed with the patient.  ASSESSMENT & PLAN:  Pancreatic cancer metastasized to liver ([place]) 1.  Metastatic pancreatic cancer to the liver: -7 cycles of gemcitabine and cisplatin day 115 from 02/10/2019 through 08/18/2019. -CT CAP on 09/16/2019 showed mass in the posterior liver dome measuring 4.6 x 3.6 cm compared to 4 x 3.2 cm on 06/01/2019.  Left lobe mass also improved by 3 mm.  No new hepatic lesions.  Study limited by lack of IV contrast.  No new areas of metastatic disease. -Even though there is slight increase in liver masses, does not qualify for progression based on RECIST criteria.  We talked about continuing current therapy versus best supportive care.  Patient would like to try continuing therapy. -We reviewed her labs today.  LFTs are slightly elevated.  Creatinine is increased to 1.56. -We will proceed with next treatment today.  However because of elevated creatinine, I would hold off on  cisplatin.  We will proceed with gemcitabine.  We will reevaluate her in 2 weeks.  2.  Hypomagnesemia: -Magnesium is 1.6 today.  She will receive IV fluids with magnesium.  3.  Left lateral chest wall pain: -She reported more pain today.  We have given tramadol in the office.  It helped her pain.  We have given a prescription today.  4.  Overactive bladder: -Continue Ditropan XL 5 mg at nighttime.  Will titrate up as needed.  5.  Weight loss: -Her weight is stable at 104.  She will continue Remeron 15 mg at bedtime.  Weight is stable in the last 2 weeks. -She was encouraged to drink boost clear.  6.  Vitamin D deficiency: -Continue vitamin D 50,000 units weekly.    Orders placed this encounter:  No orders of the defined types were placed in this encounter.      Derek Jack, MD, 09/29/19 5:26 PM  [place] [phone]   I, Jacqualyn Posey, am acting as a scribe for Dr. Sanda Linger.  I, Derek Jack MD, have reviewed the above documentation for accuracy and completeness, and I agree with the above.

## 2019-09-29 ENCOUNTER — Inpatient Hospital Stay (HOSPITAL_COMMUNITY): Payer: Medicare HMO

## 2019-09-29 ENCOUNTER — Inpatient Hospital Stay (HOSPITAL_BASED_OUTPATIENT_CLINIC_OR_DEPARTMENT_OTHER): Payer: Medicare HMO | Admitting: Hematology

## 2019-09-29 ENCOUNTER — Other Ambulatory Visit: Payer: Self-pay

## 2019-09-29 VITALS — BP 162/56 | HR 64 | Temp 97.1°F | Resp 18

## 2019-09-29 DIAGNOSIS — C787 Secondary malignant neoplasm of liver and intrahepatic bile duct: Secondary | ICD-10-CM | POA: Diagnosis not present

## 2019-09-29 DIAGNOSIS — C259 Malignant neoplasm of pancreas, unspecified: Secondary | ICD-10-CM

## 2019-09-29 DIAGNOSIS — D6481 Anemia due to antineoplastic chemotherapy: Secondary | ICD-10-CM

## 2019-09-29 DIAGNOSIS — Z5111 Encounter for antineoplastic chemotherapy: Secondary | ICD-10-CM | POA: Diagnosis not present

## 2019-09-29 LAB — COMPREHENSIVE METABOLIC PANEL
ALT: 124 U/L — ABNORMAL HIGH (ref 0–44)
AST: 117 U/L — ABNORMAL HIGH (ref 15–41)
Albumin: 3.4 g/dL — ABNORMAL LOW (ref 3.5–5.0)
Alkaline Phosphatase: 283 U/L — ABNORMAL HIGH (ref 38–126)
Anion gap: 10 (ref 5–15)
BUN: 32 mg/dL — ABNORMAL HIGH (ref 8–23)
CO2: 24 mmol/L (ref 22–32)
Calcium: 8.2 mg/dL — ABNORMAL LOW (ref 8.9–10.3)
Chloride: 106 mmol/L (ref 98–111)
Creatinine, Ser: 1.54 mg/dL — ABNORMAL HIGH (ref 0.44–1.00)
GFR calc Af Amer: 35 mL/min — ABNORMAL LOW (ref >60)
GFR calc non Af Amer: 30 mL/min — ABNORMAL LOW (ref >60)
Glucose, Bld: 116 mg/dL — ABNORMAL HIGH (ref 70–99)
Potassium: 3.5 mmol/L (ref 3.5–5.1)
Sodium: 140 mmol/L (ref 135–145)
Total Bilirubin: 1 mg/dL (ref 0.3–1.2)
Total Protein: 7.1 g/dL (ref 6.5–8.1)

## 2019-09-29 LAB — CBC WITH DIFFERENTIAL/PLATELET
Abs Immature Granulocytes: 0.01 10*3/uL (ref 0.00–0.07)
Basophils Absolute: 0 10*3/uL (ref 0.0–0.1)
Basophils Relative: 0 %
Eosinophils Absolute: 0.3 10*3/uL (ref 0.0–0.5)
Eosinophils Relative: 5 %
HCT: 26.7 % — ABNORMAL LOW (ref 36.0–46.0)
Hemoglobin: 8.7 g/dL — ABNORMAL LOW (ref 12.0–15.0)
Immature Granulocytes: 0 %
Lymphocytes Relative: 17 %
Lymphs Abs: 1 10*3/uL (ref 0.7–4.0)
MCH: 31.1 pg (ref 26.0–34.0)
MCHC: 32.6 g/dL (ref 30.0–36.0)
MCV: 95.4 fL (ref 80.0–100.0)
Monocytes Absolute: 0.5 10*3/uL (ref 0.1–1.0)
Monocytes Relative: 8 %
Neutro Abs: 4.1 10*3/uL (ref 1.7–7.7)
Neutrophils Relative %: 70 %
Platelets: 162 10*3/uL (ref 150–400)
RBC: 2.8 MIL/uL — ABNORMAL LOW (ref 3.87–5.11)
RDW: 16.4 % — ABNORMAL HIGH (ref 11.5–15.5)
WBC: 5.8 10*3/uL (ref 4.0–10.5)
nRBC: 0 % (ref 0.0–0.2)

## 2019-09-29 LAB — MAGNESIUM: Magnesium: 1.6 mg/dL — ABNORMAL LOW (ref 1.7–2.4)

## 2019-09-29 MED ORDER — TRAMADOL HCL 50 MG PO TABS
50.0000 mg | ORAL_TABLET | Freq: Once | ORAL | Status: AC
  Administered 2019-09-29: 50 mg via ORAL
  Filled 2019-09-29: qty 1

## 2019-09-29 MED ORDER — SODIUM CHLORIDE 0.9 % IV SOLN
Freq: Once | INTRAVENOUS | Status: AC
  Filled 2019-09-29: qty 1000

## 2019-09-29 MED ORDER — TRAMADOL HCL 50 MG PO TABS: 50.0000 mg | ORAL_TABLET | Freq: Two times a day (BID) | ORAL | 0 refills | Status: DC | PRN

## 2019-09-29 MED ORDER — SODIUM CHLORIDE 0.9 % IV SOLN: Freq: Once | INTRAVENOUS | Status: DC

## 2019-09-29 MED ORDER — SODIUM CHLORIDE 0.9% FLUSH
10.0000 mL | INTRAVENOUS | Status: DC | PRN
  Administered 2019-09-29 (×2): 10 mL

## 2019-09-29 MED ORDER — SODIUM CHLORIDE 0.9 % IV SOLN
10.0000 mg | Freq: Once | INTRAVENOUS | Status: AC
  Administered 2019-09-29: 10 mg via INTRAVENOUS
  Filled 2019-09-29: qty 10

## 2019-09-29 MED ORDER — SODIUM CHLORIDE 0.9 % IV SOLN: Freq: Once | INTRAVENOUS | Status: AC

## 2019-09-29 MED ORDER — POTASSIUM CHLORIDE 2 MEQ/ML IV SOLN: Freq: Once | INTRAVENOUS | Status: DC

## 2019-09-29 MED ORDER — HEPARIN SOD (PORK) LOCK FLUSH 100 UNIT/ML IV SOLN
500.0000 [IU] | Freq: Once | INTRAVENOUS | Status: AC | PRN
  Administered 2019-09-29: 500 [IU]

## 2019-09-29 MED ORDER — PALONOSETRON HCL INJECTION 0.25 MG/5ML
0.2500 mg | Freq: Once | INTRAVENOUS | Status: AC
  Administered 2019-09-29: 0.25 mg via INTRAVENOUS
  Filled 2019-09-29: qty 5

## 2019-09-29 MED ORDER — SODIUM CHLORIDE 0.9 % IV SOLN
750.0000 mg/m2 | Freq: Once | INTRAVENOUS | Status: AC
  Administered 2019-09-29: 1140 mg via INTRAVENOUS
  Filled 2019-09-29: qty 26.3

## 2019-09-29 NOTE — Progress Notes (Signed)
09/29/19  No Cisplatin today.  Change in treatment and pre-medications:  1 liter NS with potassium chloride 20 meq and magnesium sulfate 2 gm over 2 hours x 1  Premeds:  Dexamethasone 10 mg IVPB x 1 and Aloxi - no Emend today.  T.O. Dr Rhys Martini, PharmD

## 2019-09-29 NOTE — Progress Notes (Signed)

## 2019-09-29 NOTE — Patient Instructions (Signed)
[name] at [facility] Discharge Instructions  You were seen today by Dr. Delton Coombes. He went over your recent results. He will see you back in 2 weeks for labs and treatment.   Thank you for choosing [place] at [facility] to provide your oncology and hematology care.  To afford each patient quality time with our provider, please arrive at least 15 minutes before your scheduled appointment time.   If you have a lab appointment with the [facility] please come in thru the  Main Entrance and check in at the main information desk  You need to re-schedule your appointment should you arrive 10 or more minutes late.  We strive to give you quality time with our providers, and arriving late affects you and other patients whose appointments are after yours.  Also, if you no show three or more times for appointments you may be dismissed from the clinic at the providers discretion.     Again, thank you for choosing [facility].  Our hope is that these requests will decrease the amount of time that you wait before being seen by our physicians.       _____________________________________________________________  Should you have questions after your visit to [facility], please contact our office at [phone] between the hours of 8:00 a.m. and 4:30 p.m.  Voicemails left after 4:00 p.m. will not be returned until the following business day.  For prescription refill requests, have your pharmacy contact our office and allow 72 hours.    Cancer Center Support Programs:   > Cancer Support Group  2nd Tuesday of the month 1pm-2pm, Journey Room

## 2019-09-29 NOTE — Progress Notes (Signed)
Per Dr. Delton Coombes. Patient will not need to come to appointment for Fulphila injection tomorrow. Barbaraann Rondo the son was called by RN and made patient aware.

## 2019-09-29 NOTE — Progress Notes (Signed)
Patient tolerated chemotherapy with no complaints voiced.  Side effects with management reviewed with understanding verbalized.  Port site clean and dry with no bruising or swelling noted at site.  Good blood return noted before and after administration of chemotherapy.  Band aid applied.  Patient left ambulatory with VSS and no s/s of distress noted.  

## 2019-09-29 NOTE — Progress Notes (Signed)
Patient has been assessed, vital signs and labs have been reviewed by Dr. Delton Coombes. ANC, Creatinine, LFTs, and Platelets are within treatment parameters per Dr. Delton Coombes. The patient is good to proceed with treatment at this time. No Cisplatin today per Dr. Delton Coombes.

## 2019-09-29 NOTE — Assessment & Plan Note (Signed)
1.  Metastatic pancreatic cancer to the liver: -7 cycles of gemcitabine and cisplatin day 115 from 02/10/2019 through 08/18/2019. -CT CAP on 09/16/2019 showed mass in the posterior liver dome measuring 4.6 x 3.6 cm compared to 4 x 3.2 cm on 06/01/2019.  Left lobe mass also improved by 3 mm.  No new hepatic lesions.  Study limited by lack of IV contrast.  No new areas of metastatic disease. -Even though there is slight increase in liver masses, does not qualify for progression based on RECIST criteria.  We talked about continuing current therapy versus best supportive care.  Patient would like to try continuing therapy. -We reviewed her labs today.  LFTs are slightly elevated.  Creatinine is increased to 1.56. -We will proceed with next treatment today.  However because of elevated creatinine, I would hold off on cisplatin.  We will proceed with gemcitabine.  We will reevaluate her in 2 weeks.  2.  Hypomagnesemia: -Magnesium is 1.6 today.  She will receive IV fluids with magnesium.  3.  Left lateral chest wall pain: -She reported more pain today.  We have given tramadol in the office.  It helped her pain.  We have given a prescription today.  4.  Overactive bladder: -Continue Ditropan XL 5 mg at nighttime.  Will titrate up as needed.  5.  Weight loss: -Her weight is stable at 104.  She will continue Remeron 15 mg at bedtime.  Weight is stable in the last 2 weeks. -She was encouraged to drink boost clear.  6.  Vitamin D deficiency: -Continue vitamin D 50,000 units weekly.

## 2019-09-30 ENCOUNTER — Ambulatory Visit (HOSPITAL_COMMUNITY): Payer: Medicare HMO

## 2019-09-30 ENCOUNTER — Telehealth (HOSPITAL_COMMUNITY): Payer: Self-pay

## 2019-09-30 LAB — CANCER ANTIGEN 19-9: CA 19-9: 750 U/mL — ABNORMAL HIGH (ref 0–35)

## 2019-09-30 NOTE — Telephone Encounter (Signed)
Nutrition  Patient identified on Malnutrition Screening report for weight loss and poor appetite.   Chart reviewed.   Called and left message on son's voicemail (preferred number in chart).    Erum Cercone B. Zenia Resides, [place], [place] Registered Dietitian [phone] (pager)

## 2019-10-04 ENCOUNTER — Other Ambulatory Visit (HOSPITAL_COMMUNITY): Payer: Self-pay | Admitting: Nurse Practitioner

## 2019-10-11 NOTE — Progress Notes (Signed)

## 2019-10-13 ENCOUNTER — Inpatient Hospital Stay (HOSPITAL_BASED_OUTPATIENT_CLINIC_OR_DEPARTMENT_OTHER): Payer: Medicare HMO | Admitting: Hematology

## 2019-10-13 ENCOUNTER — Other Ambulatory Visit: Payer: Self-pay

## 2019-10-13 ENCOUNTER — Inpatient Hospital Stay (HOSPITAL_COMMUNITY): Payer: Medicare HMO | Attending: Hematology

## 2019-10-13 ENCOUNTER — Inpatient Hospital Stay (HOSPITAL_COMMUNITY): Payer: Medicare HMO

## 2019-10-13 ENCOUNTER — Encounter (HOSPITAL_COMMUNITY): Payer: Self-pay | Admitting: Hematology

## 2019-10-13 VITALS — BP 154/58 | HR 50 | Temp 96.8°F | Resp 17 | Wt 99.0 lb

## 2019-10-13 DIAGNOSIS — C259 Malignant neoplasm of pancreas, unspecified: Secondary | ICD-10-CM

## 2019-10-13 DIAGNOSIS — T451X5A Adverse effect of antineoplastic and immunosuppressive drugs, initial encounter: Secondary | ICD-10-CM

## 2019-10-13 DIAGNOSIS — C787 Secondary malignant neoplasm of liver and intrahepatic bile duct: Secondary | ICD-10-CM

## 2019-10-13 DIAGNOSIS — Z5111 Encounter for antineoplastic chemotherapy: Secondary | ICD-10-CM | POA: Insufficient documentation

## 2019-10-13 DIAGNOSIS — D6481 Anemia due to antineoplastic chemotherapy: Secondary | ICD-10-CM

## 2019-10-13 DIAGNOSIS — C252 Malignant neoplasm of tail of pancreas: Secondary | ICD-10-CM | POA: Diagnosis present

## 2019-10-13 LAB — COMPREHENSIVE METABOLIC PANEL
ALT: 34 U/L (ref 0–44)
AST: 37 U/L (ref 15–41)
Albumin: 3.3 g/dL — ABNORMAL LOW (ref 3.5–5.0)
Alkaline Phosphatase: 289 U/L — ABNORMAL HIGH (ref 38–126)
Anion gap: 10 (ref 5–15)
BUN: 27 mg/dL — ABNORMAL HIGH (ref 8–23)
CO2: 24 mmol/L (ref 22–32)
Calcium: 8.3 mg/dL — ABNORMAL LOW (ref 8.9–10.3)
Chloride: 104 mmol/L (ref 98–111)
Creatinine, Ser: 1.69 mg/dL — ABNORMAL HIGH (ref 0.44–1.00)
GFR calc Af Amer: 31 mL/min — ABNORMAL LOW (ref >60)
GFR calc non Af Amer: 27 mL/min — ABNORMAL LOW (ref >60)
Glucose, Bld: 102 mg/dL — ABNORMAL HIGH (ref 70–99)
Potassium: 3.7 mmol/L (ref 3.5–5.1)
Sodium: 138 mmol/L (ref 135–145)
Total Bilirubin: 0.8 mg/dL (ref 0.3–1.2)
Total Protein: 7.2 g/dL (ref 6.5–8.1)

## 2019-10-13 LAB — CBC WITH DIFFERENTIAL/PLATELET
Abs Immature Granulocytes: 0.01 10*3/uL (ref 0.00–0.07)
Basophils Absolute: 0 10*3/uL (ref 0.0–0.1)
Basophils Relative: 0 %
Eosinophils Absolute: 0.1 10*3/uL (ref 0.0–0.5)
Eosinophils Relative: 3 %
HCT: 26.1 % — ABNORMAL LOW (ref 36.0–46.0)
Hemoglobin: 8.6 g/dL — ABNORMAL LOW (ref 12.0–15.0)
Immature Granulocytes: 0 %
Lymphocytes Relative: 22 %
Lymphs Abs: 0.8 10*3/uL (ref 0.7–4.0)
MCH: 31.3 pg (ref 26.0–34.0)
MCHC: 33 g/dL (ref 30.0–36.0)
MCV: 94.9 fL (ref 80.0–100.0)
Monocytes Absolute: 0.3 10*3/uL (ref 0.1–1.0)
Monocytes Relative: 7 %
Neutro Abs: 2.6 10*3/uL (ref 1.7–7.7)
Neutrophils Relative %: 68 %
Platelets: 64 10*3/uL — ABNORMAL LOW (ref 150–400)
RBC: 2.75 MIL/uL — ABNORMAL LOW (ref 3.87–5.11)
RDW: 16 % — ABNORMAL HIGH (ref 11.5–15.5)
WBC: 3.8 10*3/uL — ABNORMAL LOW (ref 4.0–10.5)
nRBC: 0 % (ref 0.0–0.2)

## 2019-10-13 LAB — MAGNESIUM: Magnesium: 1.6 mg/dL — ABNORMAL LOW (ref 1.7–2.4)

## 2019-10-13 MED ORDER — OXYBUTYNIN CHLORIDE ER 10 MG PO TB24: ORAL_TABLET | ORAL | 6 refills | Status: AC

## 2019-10-13 MED ORDER — HEPARIN SOD (PORK) LOCK FLUSH 100 UNIT/ML IV SOLN
500.0000 [IU] | Freq: Once | INTRAVENOUS | Status: AC
  Administered 2019-10-13: 500 [IU] via INTRAVENOUS

## 2019-10-13 MED ORDER — TRAMADOL HCL 50 MG PO TABS
50.0000 mg | ORAL_TABLET | Freq: Once | ORAL | Status: AC
  Administered 2019-10-13: 50 mg via ORAL
  Filled 2019-10-13: qty 1

## 2019-10-13 MED ORDER — ERGOCALCIFEROL 1.25 MG (50000 UT) PO CAPS: 50000.0000 [IU] | ORAL_CAPSULE | ORAL | 3 refills | Status: AC

## 2019-10-13 MED ORDER — SODIUM CHLORIDE 0.9 % IV SOLN
Freq: Once | INTRAVENOUS | Status: AC
  Filled 2019-10-13: qty 1000

## 2019-10-13 NOTE — Patient Instructions (Signed)
[place] at [facility] Discharge Instructions  You were seen today by Dr. Delton Coombes. He went over your recent lab results. Try to increase your calorie intake at home to gain back some weight before you return. He will see you back in 2 weeks for labs and follow up.   Thank you for choosing [place] at [facility] to provide your oncology and hematology care.  To afford each patient quality time with our provider, please arrive at least 15 minutes before your scheduled appointment time.   If you have a lab appointment with the [facility] please come in thru the  Main Entrance and check in at the main information desk  You need to re-schedule your appointment should you arrive 10 or more minutes late.  We strive to give you quality time with our providers, and arriving late affects you and other patients whose appointments are after yours.  Also, if you no show three or more times for appointments you may be dismissed from the clinic at the providers discretion.     Again, thank you for choosing [facility].  Our hope is that these requests will decrease the amount of time that you wait before being seen by our physicians.       _____________________________________________________________  Should you have questions after your visit to [facility], please contact our office at [phone] between the hours of 8:00 a.m. and 4:30 p.m.  Voicemails left after 4:00 p.m. will not be returned until the following business day.  For prescription refill requests, have your pharmacy contact our office and allow 72 hours.    Cancer Center Support Programs:   > Cancer Support Group  2nd Tuesday of the month 1pm-2pm, Journey Room

## 2019-10-13 NOTE — Progress Notes (Signed)
[place], [place] [address]   CLINIC:  Medical Oncology/Hematology  PCP:  Fayrene Helper, MD [address], [place] / [place] [address] [phone]   REASON FOR VISIT:  Follow-up for metastatic pancreatic cancer to the liver  NGS Results: Foundation 1 MS-stable, TMB cannot be determined, K-ras G 12 V, T p53 mutation  CURRENT THERAPY: cisplatin and gamcitabine  BRIEF ONCOLOGIC HISTORY:  Oncology History  Pancreatic cancer metastasized to liver ([place])  03/10/2014 Initial Diagnosis   Pancreatic cancer metastasized to liver   03/22/2014 - 03/05/2016 Chemotherapy   Abraxane/Gemzar days 1, 8, every 28 days.  Day 15 was cancelled due to leukopenia and thrombocytopenia on day 15 cycle 1.   05/24/2014 Treatment Plan Change   Day 8 of cycle 3 is held with ANC of 1.1   06/05/2014 Imaging   CT C/A/P . Interval decrease in size of the pancreatic tail mass.Improved hepatic metastatic disease. No new lesions. No CT findings for metastatic disease involving the chest.   08/09/2014 Tumor Marker   CA 19-9= 33 (WNL)   10/26/2014 Imaging   MRI- Continued interval decrease in size of the hepatic metastatic lesions and no new lesions are identified. Continued decrease in size of the pancreatic tail lesion.   01/03/2015 Tumor Marker   Results for MARYELLA, ABOOD (MRN [id number]) as of 01/18/2015 12:52  01/03/2015 10:00 CA 19-9: 14    01/17/2015 Imaging   MRI- Response to therapy of hepatic metastasis.  Similar size of a pancreatic tail lesion.   03/20/2015 Imaging   MRI-L spine- Severe disc space narrowing at L2-L3, with endplate reactive changes. Large disc extrusion into the ventral epidural space,central to the RIGHT with a cephalad migrated free fragment. Additional Large disc extrusion into the retroperitone...   03/22/2015 Imaging   CT pelvis- No evidence for metastatic disease within the pelvis.   07/24/2015 Imaging   MRI abd- Continued  response to therapy, with no residual detectable liver metastases. No new sites of metastatic disease in the abdomen.    08/15/2015 Code Status    She confirms desire for DNR status.   12/21/2015 Imaging   MRI liver- Severe image degradation due to motion artifact reducing diagnostic sensitivity and specificity. Reduce conspicuity of the pancreatic tail lesion suggesting further improvement. The original liver lesions have resolved.   03/05/2016 Treatment Plan Change   Chemotherapy holiday/Break after 2 years worth of treatment   04/07/2016 Imaging   CT chest- When compared to recent chest CT, new minimally displaced anterior left sixth rib fracture. Slight increase in subcarinal adenopathy   04/28/2016 Imaging   Bone density- BMD as determined from Femur Neck Right is 0.705 g/cm2 with a T-Score of -2.4. This patient is considered osteopenic according to [facility]) criteria. Compared with the prior study on 02/23/2013, the BMD of the lumbar spine/rt. femoral neck show a statistically significant decrease.    05/23/2016 Imaging   MRI abd- 1. Exam is significantly degraded by patient respiratory motion. Consider follow-up exams with CT abdomen with without contrast per pancreatic protocol. 2. Fullness in tail of pancreas is more prominent and potentially increased in size. Recommend close attention on follow-up. (Consider CT as above) 3. No explanation for back pain.   09/11/2016 Imaging   MRI pancreas- Interval progression of pancreatic tail lesion. New differential perfusion right liver with areas of heterogeneity. Underlying metastatic disease in this region not excluded.   09/11/2016 Progression   MRi  in conjunction with rising CA 19-9 are indicative of relapse of disease.   01/09/2017 Progression   CT C/A/P: 2.1 x 2.9 cm lesion in the pancreatic tail and abutting the splenic hilum, corresponding to known primary pancreatic neoplasm,  mildly increased.  Scattered small hypoenhancing lesions in the liver measuring up to 10 mm, approximately 8-10 in number, suspicious for hepatic metastases.  No findings specific for metastatic disease in the chest.  Additional ancillary findings as above.    01/27/2017 - 10/20/2017 Chemotherapy   The patient had gemcitabine (GEMZAR) 1,292 mg in sodium chloride 0.9 % 250 mL chemo infusion, 800 mg/m2 = 1,292 mg, Intravenous,  Once, 9 of 12 cycles Administration: 1,292 mg (01/28/2017), 1,292 mg (02/11/2017), 1,292 mg (02/25/2017), 1,292 mg (03/11/2017), 1,292 mg (03/25/2017), 1,292 mg (04/08/2017), 1,292 mg (04/22/2017), 1,292 mg (04/29/2017), 1,292 mg (05/20/2017), 1,292 mg (06/03/2017), 1,292 mg (06/24/2017), 1,292 mg (07/08/2017), 1,292 mg (07/29/2017), 1,292 mg (08/12/2017), 1,292 mg (08/26/2017), 1,292 mg (09/09/2017), 1,292 mg (09/23/2017), 1,292 mg (10/07/2017)  for chemotherapy treatment.    10/21/2017 - 09/22/2018 Chemotherapy   The patient had leucovorin 700 mg in dextrose 5 % 250 mL infusion, 644 mg, Intravenous,  Once, 22 of 22 cycles Administration: 700 mg (10/21/2017), 700 mg (11/04/2017), 700 mg (11/18/2017), 700 mg (12/02/2017), 700 mg (12/16/2017), 700 mg (12/30/2017), 700 mg (01/13/2018), 700 mg (01/27/2018), 700 mg (02/10/2018), 700 mg (02/24/2018), 700 mg (03/10/2018), 700 mg (03/24/2018), 700 mg (04/28/2018), 700 mg (05/17/2018), 700 mg (05/31/2018), 700 mg (06/16/2018), 700 mg (06/30/2018), 700 mg (07/21/2018), 700 mg (08/04/2018), 700 mg (08/25/2018), 700 mg (09/08/2018) ondansetron (ZOFRAN) 8 mg in sodium chloride 0.9 % 50 mL IVPB, , Intravenous,  Once, 21 of 21 cycles Administration:  (11/04/2017),  (01/27/2018),  (02/10/2018),  (02/24/2018),  (03/10/2018),  (03/24/2018),  (04/28/2018),  (05/17/2018),  (05/31/2018),  (06/16/2018),  (06/30/2018),  (07/21/2018),  (08/04/2018),  (08/25/2018),  (09/08/2018) fluorouracil (ADRUCIL) 3,850 mg in sodium chloride 0.9 % 73 mL chemo infusion, 2,400 mg/m2 = 3,850 mg, Intravenous, 1  Day/Dose, 22 of 22 cycles Dose modification: 1,920 mg/m2 (original dose 2,400 mg/m2, Cycle 2, Reason: Provider Judgment, Comment: mucisitis) Administration: 3,850 mg (10/21/2017), 3,100 mg (11/04/2017), 3,100 mg (11/18/2017), 3,100 mg (12/02/2017), 3,100 mg (12/16/2017), 3,100 mg (12/30/2017), 3,100 mg (01/13/2018), 3,100 mg (01/27/2018), 3,100 mg (02/10/2018), 3,100 mg (02/24/2018), 3,100 mg (03/10/2018), 3,100 mg (03/24/2018), 3,100 mg (04/28/2018), 3,100 mg (05/17/2018), 3,100 mg (05/31/2018), 3,100 mg (06/16/2018), 3,100 mg (06/30/2018), 3,100 mg (07/21/2018), 3,100 mg (08/04/2018), 3,100 mg (08/25/2018), 3,100 mg (09/08/2018) irinotecan LIPOSOME (ONIVYDE) 81.7 mg in sodium chloride 0.9 % 500 mL chemo infusion, 50 mg/m2 = 81.7 mg (100 % of original dose 50 mg/m2), Intravenous, Once, 22 of 22 cycles Dose modification: 50 mg/m2 (original dose 50 mg/m2, Cycle 1, Reason: Provider Judgment), 50 mg/m2 (original dose 50 mg/m2, Cycle 2, Reason: Provider Judgment) Administration: 81.7 mg (10/21/2017), 81.7 mg (11/04/2017), 81.7 mg (11/18/2017), 81.7 mg (12/02/2017), 81.7 mg (12/16/2017), 81.7 mg (12/30/2017), 81.7 mg (01/13/2018), 81.7 mg (01/27/2018), 81.7 mg (02/10/2018), 81.7 mg (02/24/2018), 81.7 mg (03/10/2018), 81.7 mg (03/24/2018), 81.7 mg (04/28/2018), 81.7 mg (05/17/2018), 81.7 mg (05/31/2018), 81.7 mg (06/16/2018), 81.7 mg (06/30/2018), 81.7 mg (07/21/2018), 81.7 mg (08/04/2018), 81.7 mg (08/25/2018), 81.7 mg (09/08/2018)  for chemotherapy treatment.    09/22/2018 - 01/11/2019 Chemotherapy   The patient had palonosetron (ALOXI) injection 0.25 mg, 0.25 mg, Intravenous,  Once, 7 of 8 cycles Administration: 0.25 mg (09/22/2018), 0.25 mg (10/11/2018), 0.25 mg (10/25/2018), 0.25 mg (12/01/2018), 0.25 mg (12/15/2018), 0.25 mg (11/17/2018), 0.25 mg (12/29/2018) pegfilgrastim (  NEULASTA) injection 6 mg, 6 mg, Subcutaneous, Once, 5 of 5 cycles Administration: 6 mg (10/13/2018), 6 mg (10/27/2018), 6 mg (12/03/2018), 6 mg (12/17/2018), 6 mg  (11/19/2018) pegfilgrastim-cbqv (UDENYCA) injection 6 mg, 6 mg, Subcutaneous, Once, 1 of 2 cycles Administration: 6 mg (12/31/2018) leucovorin 628 mg in dextrose 5 % 250 mL infusion, 400 mg/m2 = 628 mg, Intravenous,  Once, 7 of 8 cycles Administration: 628 mg (09/22/2018), 628 mg (10/11/2018), 628 mg (10/25/2018), 628 mg (12/01/2018), 628 mg (12/15/2018), 628 mg (11/17/2018), 628 mg (12/29/2018) oxaliplatin (ELOXATIN) 100 mg in dextrose 5 % 500 mL chemo infusion, 65 mg/m2 = 100 mg (100 % of original dose 65 mg/m2), Intravenous,  Once, 7 of 8 cycles Dose modification: 65 mg/m2 (original dose 65 mg/m2, Cycle 1, Reason: Provider Judgment) Administration: 100 mg (09/22/2018), 100 mg (10/11/2018), 100 mg (10/25/2018), 100 mg (12/01/2018), 100 mg (12/15/2018), 100 mg (11/17/2018), 100 mg (12/29/2018) fluorouracil (ADRUCIL) chemo injection 500 mg, 320 mg/m2 = 500 mg (100 % of original dose 320 mg/m2), Intravenous,  Once, 7 of 8 cycles Dose modification: 320 mg/m2 (original dose 320 mg/m2, Cycle 1, Reason: Provider Judgment) Administration: 500 mg (09/22/2018), 500 mg (10/11/2018), 500 mg (10/25/2018), 500 mg (12/01/2018), 500 mg (12/15/2018), 500 mg (11/17/2018), 500 mg (12/29/2018) fluorouracil (ADRUCIL) 3,000 mg in sodium chloride 0.9 % 90 mL chemo infusion, 1,920 mg/m2 = 3,000 mg (100 % of original dose 1,920 mg/m2), Intravenous, 1 Day/Dose, 7 of 8 cycles Dose modification: 1,920 mg/m2 (original dose 1,920 mg/m2, Cycle 1, Reason: Provider Judgment) Administration: 3,000 mg (09/22/2018), 3,000 mg (10/11/2018), 3,000 mg (10/25/2018), 3,000 mg (12/01/2018), 3,000 mg (12/15/2018), 3,000 mg (11/17/2018), 3,000 mg (12/29/2018)  for chemotherapy treatment.    12/24/2018 Genetic Testing   Negative genetic testing on the common hereditary cancer panel.  The Common Hereditary Gene Panel offered by [facility] includes sequencing and/or deletion duplication testing of the following 48 genes: APC, ATM, AXIN2, BARD1, BMPR1A, BRCA1, BRCA2, BRIP1, CDH1, CDK4,  CDKN2A (p14ARF), CDKN2A (p16INK4a), CHEK2, CTNNA1, DICER1, EPCAM (Deletion/duplication testing only), GREM1 (promoter region deletion/duplication testing only), KIT, MEN1, MLH1, MSH2, MSH3, MSH6, MUTYH, NBN, NF1, NHTL1, PALB2, PDGFRA, PMS2, POLD1, POLE, PTEN, RAD50, RAD51C, RAD51D, RNF43, SDHB, SDHC, SDHD, SMAD4, SMARCA4. STK11, TP53, TSC1, TSC2, and VHL.  The following genes were evaluated for sequence changes only: SDHA and HOXB13 c.251G>A variant only. The report date is December 24, 2018.  DICER1 VUS identified.  This will not change medical decision making.   02/10/2019 -  Chemotherapy   The patient had palonosetron (ALOXI) injection 0.25 mg, 0.25 mg, Intravenous,  Once, 9 of 11 cycles Administration: 0.25 mg (02/10/2019), 0.25 mg (02/24/2019), 0.25 mg (03/10/2019), 0.25 mg (03/17/2019), 0.25 mg (03/31/2019), 0.25 mg (04/14/2019), 0.25 mg (05/12/2019), 0.25 mg (05/30/2019), 0.25 mg (06/14/2019), 0.25 mg (06/28/2019), 0.25 mg (07/13/2019), 0.25 mg (08/04/2019), 0.25 mg (08/18/2019), 0.25 mg (09/29/2019) pegfilgrastim-jmdb (FULPHILA) injection 6 mg, 6 mg, Subcutaneous,  Once, 7 of 9 cycles Administration: 6 mg (02/25/2019), 6 mg (03/18/2019), 6 mg (05/31/2019), 6 mg (07/14/2019), 6 mg (08/05/2019), 6 mg (08/19/2019) pegfilgrastim-cbqv (UDENYCA) injection 6 mg, 6 mg, Subcutaneous, Once, 1 of 1 cycle Administration: 6 mg (06/29/2019) CISplatin (PLATINOL) 31 mg in sodium chloride 0.9 % 250 mL chemo infusion, 20 mg/m2 = 31 mg (80 % of original dose 25 mg/m2), Intravenous,  Once, 8 of 10 cycles Dose modification: 20 mg/m2 (80 % of original dose 25 mg/m2, Cycle 1, Reason: Other (see comments), Comment: renal dysfunction) Administration: 31 mg (02/10/2019), 31 mg (02/24/2019), 31 mg (03/10/2019), 31 mg (  03/17/2019), 31 mg (03/31/2019), 31 mg (04/14/2019), 31 mg (05/30/2019), 31 mg (06/14/2019), 31 mg (06/28/2019), 31 mg (07/13/2019), 31 mg (08/04/2019), 31 mg (08/18/2019) gemcitabine (GEMZAR) 1,140 mg in sodium chloride 0.9 % 250 mL chemo  infusion, 750 mg/m2 = 1,140 mg (75 % of original dose 1,000 mg/m2), Intravenous,  Once, 9 of 11 cycles Dose modification: 750 mg/m2 (75 % of original dose 1,000 mg/m2, Cycle 1, Reason: Provider Judgment) Administration: 1,140 mg (02/10/2019), 1,140 mg (02/24/2019), 1,140 mg (03/10/2019), 1,140 mg (03/17/2019), 1,140 mg (03/31/2019), 1,140 mg (04/14/2019), 1,140 mg (05/12/2019), 1,140 mg (05/30/2019), 1,140 mg (06/14/2019), 1,140 mg (06/28/2019), 1,140 mg (07/13/2019), 1,140 mg (08/04/2019), 1,140 mg (08/18/2019), 1,140 mg (09/29/2019) fosaprepitant (EMEND) 150 mg, dexamethasone (DECADRON) 12 mg in sodium chloride 0.9 % 145 mL IVPB, , Intravenous,  Once, 9 of 11 cycles Administration:  (02/10/2019),  (02/24/2019),  (03/10/2019),  (03/17/2019),  (03/31/2019),  (04/14/2019),  (05/12/2019),  (05/30/2019),  (06/14/2019),  (06/28/2019),  (07/13/2019),  (08/04/2019),  (08/18/2019)  for chemotherapy treatment.      CANCER STAGING: Cancer Staging Pancreatic cancer metastasized to liver [facility]) Staging form: Pancreas, AJCC 7th Edition - Clinical: Stage IV (T3, N1, M1) - Signed by Baird Cancer, PA-C on 03/16/2014 - Pathologic: No stage assigned - Unsigned   INTERVAL HISTORY:  Ms. Joselyn Edling, a 86 y.o. female, returns for routine follow-up and consideration for next cycle of chemotherapy. Rashada was last seen on 09/29/2019.  Due for cycle #10 of cisplatin and gamcitabine today, but due to increased kidney numbers, she will not receive this today.  Overall, she tells me she has been feeling pretty well. She is in some pain today as she forgot to take her Tramadol today. She usually takes it every 12 hours. She notes that the pain under her ribcage alternates from the left to the right side. She does have occasional constipation, which she treats with stool softener. Her appetite is decreased, but she does enjoy milkshakes.    REVIEW OF SYSTEMS:  Review of Systems  All other systems reviewed and are negative.   PAST  MEDICAL/SURGICAL HISTORY:  Past Medical History:  Diagnosis Date  . Anemia due to antineoplastic chemotherapy 09/12/2015   Started Aranesp 500 mcg on 09/12/2015  . Anxiety   . Chronic diarrhea   . Depression   . DNR (do not resuscitate) 08/16/2015  . Erosive esophagitis   . Family history of breast cancer in female   . Family history of prostate cancer   . GERD (gastroesophageal reflux disease)   . Hx of adenomatous colonic polyps    tubular adenomas, last found in 2008  . Hyperplastic colon polyp 03/19/10   tcs by Dr. Gala Romney  . Hypertension 20 years   . Kidney stone    hx/ crushed   . Opioid contract exists 04/18/2015   With Dr. Moshe Cipro  . Pancreatic cancer ([place]) 02/2014  . Pancreatic cancer metastasized to liver ([place]) 03/10/2014  . Schatzki's ring 08/26/10   Last dilated on EGD by Dr. Trevor Iha [facility], linear gastric erosions, BI hemigastrectomy   Past Surgical History:  Procedure Laterality Date  . BREAST LUMPECTOMY Left   . bunion removal     from both feet   . CHOLECYSTECTOMY  1965   . COLONOSCOPY  03/19/2010   DR Gala Romney,, normal TI, pancolonic diverticula, random colon bx neg., hyperplastic polyps removed  . ESOPHAGOGASTRODUODENOSCOPY  11/22/2003   DR Gala Romney, erosive RE, Billroth I  . ESOPHAGOGASTRODUODENOSCOPY  08/26/10   Dr. Gala Romney- moderate severe ERE,  Scahtzki ring s/p dilation, Billroth I, linear gastric erosions, bx-gastric xanthelasma  . LEFT SHOULDER SURGERY  2009   DR HARRISON  . ORIF ANKLE FRACTURE Right 05/22/2013   Procedure: OPEN REDUCTION INTERNAL FIXATION (ORIF) RIGHT ANKLE FRACTURE;  Surgeon: Sanjuana Kava, MD;  Location: [facility];  Service: Orthopedics;  Laterality: Right;  . stomach ulcer  50 years ago    had some of her stomach removed     SOCIAL HISTORY:  Social History   Socioeconomic History  . Marital status: Widowed    Spouse name: Not on file  . Number of children: 2  . Years of education: 68  . Highest education level: 12th grade  Occupational  History  . Occupation: retired from Estate manager/land agent: RETIRED  Tobacco Use  . Smoking status: Current Some Day Smoker    Packs/day: 0.50    Years: 60.00    Pack years: 30.00    Types: Cigarettes    Last attempt to quit: 09/09/2016    Years since quitting: 3.0  . Smokeless tobacco: Never Used  Substance and Sexual Activity  . Alcohol use: No  . Drug use: No  . Sexual activity: Not Currently  Other Topics Concern  . Not on file  Social History Narrative   Lives with son alone    Social Determinants of Health   Financial Resource Strain:   . Difficulty of Paying Living Expenses:   Food Insecurity:   . Worried About Charity fundraiser in the Last Year:   . Arboriculturist in the Last Year:   Transportation Needs:   . Film/video editor (Medical):   Marland Kitchen Lack of Transportation (Non-Medical):   Physical Activity:   . Days of Exercise per Week:   . Minutes of Exercise per Session:   Stress:   . Feeling of Stress :   Social Connections:   . Frequency of Communication with Friends and Family:   . Frequency of Social Gatherings with Friends and Family:   . Attends Religious Services:   . Active Member of Clubs or Organizations:   . Attends Archivist Meetings:   Marland Kitchen Marital Status:   Intimate Partner Violence:   . Fear of Current or Ex-Partner:   . Emotionally Abused:   Marland Kitchen Physically Abused:   . Sexually Abused:     FAMILY HISTORY:  Family History  Problem Relation Age of Onset  . Hypertension Mother   . Kidney failure Brother        on dialysis  . Diabetes Sister   . Diabetes Sister   . Hypertension Father   . Kidney failure Sister   . Kidney Stones Brother   . Breast cancer Son 81  . Gout Son 57  . Gout Nephew 57  . Liver disease Neg Hx   . Colon cancer Neg Hx     CURRENT MEDICATIONS:  Current Outpatient Medications  Medication Sig Dispense Refill  . amLODipine (NORVASC) 10 MG tablet TAKE 1 TABLET DAILY 90 tablet 3  . CISPLATIN IV Inject into  the vein. Days 1, 8 every 21 days    . clobetasol (TEMOVATE) 0.05 % external solution APP AA ON SCALP BID PRN NOT TO FACE GROIN OR UNDERARMS    . clonazePAM (KLONOPIN) 0.5 MG tablet Take 1 tablet (0.5 mg total) by mouth at bedtime. 90 tablet 1  . cycloSPORINE (RESTASIS) 0.05 % ophthalmic emulsion Place 1 drop into both eyes 2 (two) times daily.    Marland Kitchen  ergocalciferol (VITAMIN D2) 1.25 MG (50000 UT) capsule Take 1 capsule (50,000 Units total) by mouth once a week. 4 capsule 3  . fluorometholone (FML) 0.1 % ophthalmic suspension INSTILL 1 DROP INTO EACH EYE THREE TIMES DAILY FOR 14 DAYS  2  . fluticasone (CUTIVATE) 0.05 % cream APPLY TO THE AFFECTED AREA FACE TWICE DAILY AS NEEDED    . fluticasone (FLONASE) 50 MCG/ACT nasal spray Place 1 spray into both nostrils daily.    Marland Kitchen GEMCITABINE HCL IV Inject into the vein. Days 1, 8 every 21 days    . loperamide (IMODIUM A-D) 2 MG tablet Take 2 mg by mouth 4 (four) times daily as needed for diarrhea or loose stools.    . metoprolol tartrate (LOPRESSOR) 25 MG tablet TAKE 1 TABLET DAILY FOR BLOOD PRESSURE (DOSE REDUCTION) 90 tablet 3  . mirtazapine (REMERON) 15 MG tablet Take 15 mg by mouth at bedtime.    Marland Kitchen MYRBETRIQ 50 MG TB24 tablet Take 50 mg by mouth daily.    Marland Kitchen oxybutynin (DITROPAN-XL) 5 MG 24 hr tablet TAKE 1 TABLET(5 MG) BY MOUTH AT BEDTIME 30 tablet 0  . pantoprazole (PROTONIX) 20 MG tablet TAKE 1 TABLET DAILY 90 tablet 3  . senna-docusate (SENOKOT-S) 8.6-50 MG tablet Take 1 tablet by mouth daily.    . traMADol (ULTRAM) 50 MG tablet Take 1 tablet (50 mg total) by mouth every 12 (twelve) hours as needed. 30 tablet 0  . acetaminophen (TYLENOL) 500 MG tablet Take 500 mg by mouth every 6 (six) hours as needed for mild pain or moderate pain.     Marland Kitchen lidocaine (XYLOCAINE) 2 % solution RINSE WITH 5ML AS NEEDED TO RELIEVE MOUTH PAIN.    Marland Kitchen lidocaine-prilocaine (EMLA) cream Apply 1 application topically as needed. Apply to portacath site as needed (Patient not  taking: Reported on 10/13/2019) 30 g 1   No current facility-administered medications for this visit.    ALLERGIES:  Allergies  Allergen Reactions  . Iohexol Hives and Shortness Of Breath    **Patient can not have IV contrast, pt has breakthrough reaction even after 13 hour premeds** Do not give IV contrast PATIENT HAD TO BE TAKEN TO ED, C/O WHELPS, HIVES, DIFFICULTY BREATHING. PATIENT GIVEN IV CONTRAST AFTER 13 HOUR PRE MEDS ON 01/09/2017 WITH NO REACTION NOTED. On 10/19/17 patient had reaction despite premedication.    Marland Kitchen Aciphex [Rabeprazole Sodium] Other (See Comments)    unknown  . Amlodipine Besylate-Valsartan     Rash to high-dose (5/320)  . Esomeprazole Magnesium   . Omeprazole Other (See Comments)    Patient states "medication didn't work"  . Ciprofloxacin Rash  . Penicillins Swelling and Rash    Has patient had a PCN reaction causing immediate rash, facial/tongue/throat swelling, SOB or lightheadedness with hypotension: Yes Has patient had a PCN reaction causing severe rash involving mucus membranes or skin necrosis: Yes Has patient had a PCN reaction that required hospitalization: No Has patient had a PCN reaction occurring within the last 10 years: yes If all of the above answers are "NO", then may proceed with Cephalosporin use.   [name] [Benzonatate] Itching    Full head, face, neck, and chest itching.     PHYSICAL EXAM:  Performance status (ECOG): 1 - Symptomatic but completely ambulatory  Vitals:   10/13/19 0858  BP: (!) 154/58  Pulse: (!) 50  Resp: 17  Temp: (!) 96.8 F (36 C)  SpO2: 100%   Wt Readings from Last 3 Encounters:  10/13/19  99 lb (44.9 kg)  10/13/19 98 lb 14.4 oz (44.9 kg)  09/29/19 104 lb 12.8 oz (47.5 kg)   Physical Exam Vitals reviewed.  Cardiovascular:     Pulses: Normal pulses.     Heart sounds: Normal heart sounds.  Pulmonary:     Effort: Pulmonary effort is normal.     Breath sounds: Normal breath sounds.  Neurological:      Mental Status: She is alert.  Psychiatric:        Mood and Affect: Mood normal.        Behavior: Behavior normal.     LABORATORY DATA:  I have reviewed the labs as listed.  CBC Latest Ref Rng & Units 10/13/2019 09/29/2019 09/22/2019  WBC 4.0 - 10.5 K/uL 3.8(L) 5.8 7.4  Hemoglobin 12.0 - 15.0 g/dL 8.6(L) 8.7(L) 9.2(L)  Hematocrit 36.0 - 46.0 % 26.1(L) 26.7(L) 28.2(L)  Platelets 150 - 400 K/uL 64(L) 162 168   CMP Latest Ref Rng & Units 10/13/2019 09/29/2019 09/22/2019  Glucose 70 - 99 mg/dL 102(H) 116(H) 110(H)  BUN 8 - 23 mg/dL 27(H) 32(H) 28(H)  Creatinine 0.44 - 1.00 mg/dL 1.69(H) 1.54(H) 1.52(H)  Sodium 135 - 145 mmol/L 138 140 138  Potassium 3.5 - 5.1 mmol/L 3.7 3.5 3.9  Chloride 98 - 111 mmol/L 104 106 106  CO2 22 - 32 mmol/L '24 24 22  ' Calcium 8.9 - 10.3 mg/dL 8.3(L) 8.2(L) 8.2(L)  Total Protein 6.5 - 8.1 g/dL 7.2 7.1 7.5  Total Bilirubin 0.3 - 1.2 mg/dL 0.8 1.0 0.8  Alkaline Phos 38 - 126 U/L 289(H) 283(H) 254(H)  AST 15 - 41 U/L 37 117(H) 93(H)  ALT 0 - 44 U/L 34 124(H) 96(H)    DIAGNOSTIC IMAGING:  I have independently reviewed the scans and discussed with the patient. CT Abdomen Pelvis Wo Contrast  Result Date: 09/16/2019 CLINICAL DATA:  Follow-up metastatic pancreatic carcinoma. Currently undergoing chemotherapy EXAM: CT CHEST, ABDOMEN AND PELVIS WITHOUT CONTRAST TECHNIQUE: Multidetector CT imaging of the chest, abdomen and pelvis was performed following the standard protocol without IV contrast. COMPARISON:  06/09/2019 FINDINGS: CT CHEST FINDINGS Cardiovascular: No acute findings. Aortic and coronary artery atherosclerosis noted. Mediastinum/Lymph Nodes: No masses or pathologically enlarged lymph nodes identified on this unenhanced exam. Lungs/Pleura: Mild emphysema again noted. No evidence of infiltrate, mass, or pleural effusion. Musculoskeletal: No suspicious bone lesions identified. CT ABDOMEN AND PELVIS FINDINGS Hepatobiliary: Low-attenuation mass in posterior liver dome  currently measures 4.6 x 3.6 cm on image 51/2, compared to 4.0 x 3.2 cm previously. Another low-attenuation mass in the left lobe measures 2.7 x 2.5 cm on image 53/2, compared to 2.4 x 1.9 cm previously. No new hepatic lesions are identified, although sensitivity is reduced due to lack of IV contrast. Prior cholecystectomy. No evidence of biliary obstruction. Pancreas: Limited evaluation on this unenhanced exam. Soft tissue fullness in the pancreatic tail in the splenic hilum shows no significant change compared to prior study. Spleen: Within normal limits in size. Low-attenuation lesion in the inferior aspect of the spleen measures 2.3 cm, without significant change since previous study. Adrenals/Urinary Tract: Several small less than 1 cm renal calculi are again seen bilaterally as well as probable small renal cysts. No evidence of ureteral calculi or hydronephrosis. Stomach/Bowel: No evidence of obstruction, inflammatory process, or abnormal fluid collections. Vascular/Lymphatic: No pathologically enlarged lymph nodes identified. No abdominal aortic aneurysm. Aortic atherosclerosis noted. Reproductive: Stable small less than 1 cm calcified uterine fibroid. No other significant abnormality. Other: Scattered small soft tissue  nodules are again seen in the omental and mesenteric fat, but without significant change since previous study. These are consistent with peritoneal metastases. No evidence of ascites. Musculoskeletal: No suspicious bone lesions identified. IMPRESSION: 1. Limited evaluation given lack of IV contrast. Probable mild increase in size of liver metastases since previous study. 2. Stable small peritoneal and omental metastases. No evidence of ascites. 3. No new sites of metastatic disease identified. 4. Stable soft tissue fullness in pancreatic tail. 5. Tiny less than 1 cm calcified uterine fibroid. 6. Bilateral nephrolithiasis. No evidence of ureteral calculi or hydronephrosis. Aortic  Atherosclerosis (ICD10-I70.0) and Emphysema (ICD10-J43.9). Electronically Signed   By: Marlaine Hind M.D.   On: 09/16/2019 16:13   CT Chest Wo Contrast  Result Date: 09/16/2019 CLINICAL DATA:  Follow-up metastatic pancreatic carcinoma. Currently undergoing chemotherapy EXAM: CT CHEST, ABDOMEN AND PELVIS WITHOUT CONTRAST TECHNIQUE: Multidetector CT imaging of the chest, abdomen and pelvis was performed following the standard protocol without IV contrast. COMPARISON:  06/09/2019 FINDINGS: CT CHEST FINDINGS Cardiovascular: No acute findings. Aortic and coronary artery atherosclerosis noted. Mediastinum/Lymph Nodes: No masses or pathologically enlarged lymph nodes identified on this unenhanced exam. Lungs/Pleura: Mild emphysema again noted. No evidence of infiltrate, mass, or pleural effusion. Musculoskeletal: No suspicious bone lesions identified. CT ABDOMEN AND PELVIS FINDINGS Hepatobiliary: Low-attenuation mass in posterior liver dome currently measures 4.6 x 3.6 cm on image 51/2, compared to 4.0 x 3.2 cm previously. Another low-attenuation mass in the left lobe measures 2.7 x 2.5 cm on image 53/2, compared to 2.4 x 1.9 cm previously. No new hepatic lesions are identified, although sensitivity is reduced due to lack of IV contrast. Prior cholecystectomy. No evidence of biliary obstruction. Pancreas: Limited evaluation on this unenhanced exam. Soft tissue fullness in the pancreatic tail in the splenic hilum shows no significant change compared to prior study. Spleen: Within normal limits in size. Low-attenuation lesion in the inferior aspect of the spleen measures 2.3 cm, without significant change since previous study. Adrenals/Urinary Tract: Several small less than 1 cm renal calculi are again seen bilaterally as well as probable small renal cysts. No evidence of ureteral calculi or hydronephrosis. Stomach/Bowel: No evidence of obstruction, inflammatory process, or abnormal fluid collections. Vascular/Lymphatic:  No pathologically enlarged lymph nodes identified. No abdominal aortic aneurysm. Aortic atherosclerosis noted. Reproductive: Stable small less than 1 cm calcified uterine fibroid. No other significant abnormality. Other: Scattered small soft tissue nodules are again seen in the omental and mesenteric fat, but without significant change since previous study. These are consistent with peritoneal metastases. No evidence of ascites. Musculoskeletal: No suspicious bone lesions identified. IMPRESSION: 1. Limited evaluation given lack of IV contrast. Probable mild increase in size of liver metastases since previous study. 2. Stable small peritoneal and omental metastases. No evidence of ascites. 3. No new sites of metastatic disease identified. 4. Stable soft tissue fullness in pancreatic tail. 5. Tiny less than 1 cm calcified uterine fibroid. 6. Bilateral nephrolithiasis. No evidence of ureteral calculi or hydronephrosis. Aortic Atherosclerosis (ICD10-I70.0) and Emphysema (ICD10-J43.9). Electronically Signed   By: Marlaine Hind M.D.   On: 09/16/2019 16:13     ASSESSMENT:  1.  Metastatic pancreatic cancer to the liver: -Gemcitabine and Abraxane from November 2015 through August 2018 with progression. -Gemcitabine and Tarceva from 01/28/2017 through 10/07/2017 with progression. -21 cycles of infusional 5-FU and Onivyde from 10/21/2017 through 09/08/2018. -7 cycles of FOLFOX from 09/22/2018 through 12/29/2018. -Gemcitabine and cisplatin day 1 and day 15 started on 02/10/2019. -CT CAP on 09/16/2019  showed mass in the posterior liver dome measuring 4.6 x 3.6 cm compared to 4 x 3.2 cm on 06/01/2019.  Left lobe mass also improved with 3 mm.  No new hepatic lesions.  Study limited by lack of IV contrast.  No new areas of metastatic disease.    PLAN:  1.  Metastatic pancreatic cancer to the liver: -I reviewed her blood work.  Creatinine has increased to 1.69.  Platelet count is 64.  She also lost 5 pounds.  Her CA 19-9 on  09/29/2019 increased to 750. -Hence I would hold her treatment today.  She will receive IV fluids. -We will reevaluate her in 2 weeks.  If there is no improvement, will consider palliative care.  2.  Hypomagnesemia: -Magnesium today is 1.6.  IV fluids with magnesium.  3.  Left lateral chest wall pain: -She reported more pain in the last few days under bilateral rib cage. -She will continue tramadol every 12 hours.  She will also continue stool softeners as needed.  4.  Overactive bladder: -I will increase oxybutynin to 10 mg daily.  5.  Weight loss: -He lost 5 pounds since last visit 2 weeks ago.  She was encouraged to drink protein shakes.  Continue Remeron 15 mg at bedtime.  6.  Vitamin D deficiency: -Continue vitamin D 50,000 units weekly.    Orders placed this encounter:  No orders of the defined types were placed in this encounter.    Derek Jack, MD [facility] [phone]   I, Jacqualyn Posey, am acting as a scribe for Dr. Sanda Linger.  I, Derek Jack MD, have reviewed the above documentation for accuracy and completeness, and I agree with the above.

## 2019-10-13 NOTE — Progress Notes (Signed)
Patient tolerated hydration with no complaints voiced.  Port site clean and dry with good blood return noted before and after hydration.  No bruising or swelling noted with port.  Band aid applied.  VSS with discharge and left by wheelchair with no s/s of distress noted.   

## 2019-10-14 ENCOUNTER — Ambulatory Visit (HOSPITAL_COMMUNITY): Payer: Medicare HMO

## 2019-10-14 LAB — CANCER ANTIGEN 19-9: CA 19-9: 722 U/mL — ABNORMAL HIGH (ref 0–35)

## 2019-10-26 ENCOUNTER — Other Ambulatory Visit (HOSPITAL_COMMUNITY): Payer: Self-pay | Admitting: *Deleted

## 2019-10-26 MED ORDER — TRAMADOL HCL 50 MG PO TABS: 50.0000 mg | ORAL_TABLET | Freq: Two times a day (BID) | ORAL | 0 refills | Status: DC | PRN

## 2019-10-27 ENCOUNTER — Other Ambulatory Visit: Payer: Self-pay

## 2019-10-27 ENCOUNTER — Other Ambulatory Visit (HOSPITAL_COMMUNITY): Payer: Self-pay | Admitting: *Deleted

## 2019-10-27 ENCOUNTER — Inpatient Hospital Stay (HOSPITAL_COMMUNITY): Payer: Medicare HMO

## 2019-10-27 ENCOUNTER — Inpatient Hospital Stay (HOSPITAL_BASED_OUTPATIENT_CLINIC_OR_DEPARTMENT_OTHER): Payer: Medicare HMO | Admitting: Hematology

## 2019-10-27 VITALS — BP 139/58 | HR 56 | Temp 96.4°F | Resp 18 | Wt 97.0 lb

## 2019-10-27 VITALS — BP 158/56 | HR 50 | Temp 96.8°F | Resp 17

## 2019-10-27 DIAGNOSIS — C259 Malignant neoplasm of pancreas, unspecified: Secondary | ICD-10-CM

## 2019-10-27 DIAGNOSIS — C787 Secondary malignant neoplasm of liver and intrahepatic bile duct: Secondary | ICD-10-CM

## 2019-10-27 DIAGNOSIS — D6481 Anemia due to antineoplastic chemotherapy: Secondary | ICD-10-CM

## 2019-10-27 DIAGNOSIS — Z5111 Encounter for antineoplastic chemotherapy: Secondary | ICD-10-CM | POA: Diagnosis not present

## 2019-10-27 LAB — COMPREHENSIVE METABOLIC PANEL
ALT: 48 U/L — ABNORMAL HIGH (ref 0–44)
AST: 87 U/L — ABNORMAL HIGH (ref 15–41)
Albumin: 3.4 g/dL — ABNORMAL LOW (ref 3.5–5.0)
Alkaline Phosphatase: 293 U/L — ABNORMAL HIGH (ref 38–126)
Anion gap: 11 (ref 5–15)
BUN: 31 mg/dL — ABNORMAL HIGH (ref 8–23)
CO2: 24 mmol/L (ref 22–32)
Calcium: 8.3 mg/dL — ABNORMAL LOW (ref 8.9–10.3)
Chloride: 103 mmol/L (ref 98–111)
Creatinine, Ser: 1.69 mg/dL — ABNORMAL HIGH (ref 0.44–1.00)
GFR calc Af Amer: 31 mL/min — ABNORMAL LOW (ref >60)
GFR calc non Af Amer: 27 mL/min — ABNORMAL LOW (ref >60)
Glucose, Bld: 115 mg/dL — ABNORMAL HIGH (ref 70–99)
Potassium: 3.5 mmol/L (ref 3.5–5.1)
Sodium: 138 mmol/L (ref 135–145)
Total Bilirubin: 0.7 mg/dL (ref 0.3–1.2)
Total Protein: 7.6 g/dL (ref 6.5–8.1)

## 2019-10-27 LAB — CBC WITH DIFFERENTIAL/PLATELET
Abs Immature Granulocytes: 0.01 10*3/uL (ref 0.00–0.07)
Basophils Absolute: 0 10*3/uL (ref 0.0–0.1)
Basophils Relative: 1 %
Eosinophils Absolute: 0.1 10*3/uL (ref 0.0–0.5)
Eosinophils Relative: 1 %
HCT: 28.3 % — ABNORMAL LOW (ref 36.0–46.0)
Hemoglobin: 9.5 g/dL — ABNORMAL LOW (ref 12.0–15.0)
Immature Granulocytes: 0 %
Lymphocytes Relative: 13 %
Lymphs Abs: 0.7 10*3/uL (ref 0.7–4.0)
MCH: 31.9 pg (ref 26.0–34.0)
MCHC: 33.6 g/dL (ref 30.0–36.0)
MCV: 95 fL (ref 80.0–100.0)
Monocytes Absolute: 0.6 10*3/uL (ref 0.1–1.0)
Monocytes Relative: 11 %
Neutro Abs: 4.1 10*3/uL (ref 1.7–7.7)
Neutrophils Relative %: 74 %
Platelets: 159 10*3/uL (ref 150–400)
RBC: 2.98 MIL/uL — ABNORMAL LOW (ref 3.87–5.11)
RDW: 15.5 % (ref 11.5–15.5)
WBC: 5.6 10*3/uL (ref 4.0–10.5)
nRBC: 0 % (ref 0.0–0.2)

## 2019-10-27 LAB — MAGNESIUM: Magnesium: 1.8 mg/dL (ref 1.7–2.4)

## 2019-10-27 MED ORDER — SODIUM CHLORIDE 0.9% FLUSH
10.0000 mL | Freq: Once | INTRAVENOUS | Status: AC | PRN
  Administered 2019-10-27: 10 mL

## 2019-10-27 MED ORDER — PALONOSETRON HCL INJECTION 0.25 MG/5ML
0.2500 mg | Freq: Once | INTRAVENOUS | Status: AC
  Administered 2019-10-27: 0.25 mg via INTRAVENOUS
  Filled 2019-10-27: qty 5

## 2019-10-27 MED ORDER — SODIUM CHLORIDE 0.9 % IV SOLN
500.0000 mg/m2 | Freq: Once | INTRAVENOUS | Status: AC
  Administered 2019-10-27: 760 mg via INTRAVENOUS
  Filled 2019-10-27: qty 19.99

## 2019-10-27 MED ORDER — HEPARIN SOD (PORK) LOCK FLUSH 100 UNIT/ML IV SOLN
500.0000 [IU] | Freq: Once | INTRAVENOUS | Status: AC | PRN
  Administered 2019-10-27: 500 [IU]

## 2019-10-27 MED ORDER — SODIUM CHLORIDE 0.9 % IV SOLN
Freq: Once | INTRAVENOUS | Status: AC
  Filled 2019-10-27: qty 1000

## 2019-10-27 MED ORDER — SODIUM CHLORIDE 0.9 % IV SOLN
Freq: Once | INTRAVENOUS | Status: AC
  Filled 2019-10-27: qty 150

## 2019-10-27 MED ORDER — SODIUM CHLORIDE 0.9 % IV SOLN: Freq: Once | INTRAVENOUS | Status: AC

## 2019-10-27 MED ORDER — TRAMADOL HCL 50 MG PO TABS: 50.0000 mg | ORAL_TABLET | Freq: Three times a day (TID) | ORAL | 2 refills | Status: AC

## 2019-10-27 MED ORDER — SODIUM CHLORIDE 0.9 % IV SOLN
10.0000 mg | Freq: Once | INTRAVENOUS | Status: AC
  Administered 2019-10-27: 10 mg via INTRAVENOUS
  Filled 2019-10-27: qty 10

## 2019-10-27 NOTE — Telephone Encounter (Signed)
Per Dr. Delton Coombes, increase Tramadol to three times daily.  New prescription sent to Dr. Raliegh Ip to sign and send to pharmacy.

## 2019-10-27 NOTE — Patient Instructions (Signed)
[facility] Discharge Instructions for Patients Receiving Chemotherapy  Today you received the following chemotherapy agents   To help prevent nausea and vomiting after your treatment, we encourage you to take your nausea medication   If you develop nausea and vomiting that is not controlled by your nausea medication, call the clinic.   BELOW ARE SYMPTOMS THAT SHOULD BE REPORTED IMMEDIATELY:  *FEVER GREATER THAN 100.5 F  *CHILLS WITH OR WITHOUT FEVER  NAUSEA AND VOMITING THAT IS NOT CONTROLLED WITH YOUR NAUSEA MEDICATION  *UNUSUAL SHORTNESS OF BREATH  *UNUSUAL BRUISING OR BLEEDING  TENDERNESS IN MOUTH AND THROAT WITH OR WITHOUT PRESENCE OF ULCERS  *URINARY PROBLEMS  *BOWEL PROBLEMS  UNUSUAL RASH Items with * indicate a potential emergency and should be followed up as soon as possible.  Feel free to call the clinic should you have any questions or concerns. The clinic phone number is [phone].  Please show the CHEMO ALERT CARD at check-in to the Emergency Department and triage nurse.   

## 2019-10-27 NOTE — Patient Instructions (Signed)
[place] at [facility] Discharge Instructions  You were seen today by Dr. Delton Coombes. He went over your recent results. Your Tramadol will be increased to three times daily. Eat more high-calorie foods to increase your calorie intake. You will be seen in 2 weeks with a PA for labs and follow up.   Thank you for choosing [place] at [facility] to provide your oncology and hematology care.  To afford each patient quality time with our provider, please arrive at least 15 minutes before your scheduled appointment time.   If you have a lab appointment with the [place] please come in thru the Main Entrance and check in at the main information desk  You need to re-schedule your appointment should you arrive 10 or more minutes late.  We strive to give you quality time with our providers, and arriving late affects you and other patients whose appointments are after yours.  Also, if you no show three or more times for appointments you may be dismissed from the clinic at the providers discretion.     Again, thank you for choosing [facility].  Our hope is that these requests will decrease the amount of time that you wait before being seen by our physicians.       _____________________________________________________________  Should you have questions after your visit to [facility], please contact our office at [phone] between the hours of 8:00 a.m. and 4:30 p.m.  Voicemails left after 4:00 p.m. will not be returned until the following business day.  For prescription refill requests, have your pharmacy contact our office and allow 72 hours.    Cancer Center Support Programs:   > Cancer Support Group  2nd Tuesday of the month 1pm-2pm, Journey Room

## 2019-10-27 NOTE — Progress Notes (Signed)
Patient presents today for treatment and follow up visit with Dr. Delton Coombes. Labs pending. Vital signs within parameters for treatment. Patient has complaints of ongoing pain today she rates a 4/10 under her left rib cage. Patient states it's not any worse than it was. Patient denies any significant changes since her last visit. MAR reviewed and updated.   Per AAnderson RN/ Dr. Delton Coombes.  Hold Cisplatin today. Infuse Gemzar and House fluids.   Treatment given today per MD orders. Tolerated infusion without adverse affects. Vital signs stable. No complaints at this time. Discharged from clinic via wheel chair. F/U with [facility] as scheduled.   Gemzar given today per MD orders. Tolerated infusion without adverse affects. Vital signs stable. No complaints at this time. Discharged from clinic via wheel chair.  F/U with [facility] as scheduled.

## 2019-10-27 NOTE — Progress Notes (Signed)
10/27/19  Holding Cisplatin but give premedications of Emend, Dexamethasone, and Aloxi in setting of nausea confirmed by nursing.  Henreitta Leber, PharmD

## 2019-10-27 NOTE — Progress Notes (Signed)
Patient has been assessed, vital signs and labs have been reviewed by Dr. Delton Coombes. ANC, Creatinine, LFTs, and Platelets are within treatment parameters per Dr. Delton Coombes. The patient is good to proceed with treatment at this time. HOLD cisplatin today; gemcitabine only; give IVF with electrolytes.

## 2019-10-27 NOTE — Progress Notes (Signed)
[place], [place] [address]   CLINIC:  Medical Oncology/Hematology  PCP:  Fayrene Helper, MD [address] [place] / [place] [address] [phone]   REASON FOR VISIT:  Follow-up for metastatic pancreatic cancer to the liver  NGS Results: Foundation 1 MS-stable, TMB cannot be determined, K-ras G 12 V, T p53 mutation.  CURRENT THERAPY: cisplatin, gemcitabine and Aloxi  BRIEF ONCOLOGIC HISTORY:  Oncology History  Pancreatic cancer metastasized to liver ([facility])  03/10/2014 Initial Diagnosis   Pancreatic cancer metastasized to liver   03/22/2014 - 03/05/2016 Chemotherapy   Abraxane/Gemzar days 1, 8, every 28 days.  Day 15 was cancelled due to leukopenia and thrombocytopenia on day 15 cycle 1.   05/24/2014 Treatment Plan Change   Day 8 of cycle 3 is held with ANC of 1.1   06/05/2014 Imaging   CT C/A/P . Interval decrease in size of the pancreatic tail mass.Improved hepatic metastatic disease. No new lesions. No CT findings for metastatic disease involving the chest.   08/09/2014 Tumor Marker   CA 19-9= 33 (WNL)   10/26/2014 Imaging   MRI- Continued interval decrease in size of the hepatic metastatic lesions and no new lesions are identified. Continued decrease in size of the pancreatic tail lesion.   01/03/2015 Tumor Marker   Results for JOURNII, NIERMAN (MRN [id number]) as of 01/18/2015 12:52  01/03/2015 10:00 CA 19-9: 14    01/17/2015 Imaging   MRI- Response to therapy of hepatic metastasis.  Similar size of a pancreatic tail lesion.   03/20/2015 Imaging   MRI-L spine- Severe disc space narrowing at L2-L3, with endplate reactive changes. Large disc extrusion into the ventral epidural space,central to the RIGHT with a cephalad migrated free fragment. Additional Large disc extrusion into the retroperitone...   03/22/2015 Imaging   CT pelvis- No evidence for metastatic disease within the pelvis.   07/24/2015 Imaging   MRI abd-  Continued response to therapy, with no residual detectable liver metastases. No new sites of metastatic disease in the abdomen.    08/15/2015 Code Status    She confirms desire for DNR status.   12/21/2015 Imaging   MRI liver- Severe image degradation due to motion artifact reducing diagnostic sensitivity and specificity. Reduce conspicuity of the pancreatic tail lesion suggesting further improvement. The original liver lesions have resolved.   03/05/2016 Treatment Plan Change   Chemotherapy holiday/Break after 2 years worth of treatment   04/07/2016 Imaging   CT chest- When compared to recent chest CT, new minimally displaced anterior left sixth rib fracture. Slight increase in subcarinal adenopathy   04/28/2016 Imaging   Bone density- BMD as determined from Femur Neck Right is 0.705 g/cm2 with a T-Score of -2.4. This patient is considered osteopenic according to [facility]) criteria. Compared with the prior study on 02/23/2013, the BMD of the lumbar spine/rt. femoral neck show a statistically significant decrease.    05/23/2016 Imaging   MRI abd- 1. Exam is significantly degraded by patient respiratory motion. Consider follow-up exams with CT abdomen with without contrast per pancreatic protocol. 2. Fullness in tail of pancreas is more prominent and potentially increased in size. Recommend close attention on follow-up. (Consider CT as above) 3. No explanation for back pain.   09/11/2016 Imaging   MRI pancreas- Interval progression of pancreatic tail lesion. New differential perfusion right liver with areas of heterogeneity. Underlying metastatic disease in this region not excluded.   09/11/2016 Progression  MRi in conjunction with rising CA 19-9 are indicative of relapse of disease.   01/09/2017 Progression   CT C/A/P: 2.1 x 2.9 cm lesion in the pancreatic tail and abutting the splenic hilum, corresponding to known primary pancreatic neoplasm,  mildly increased.  Scattered small hypoenhancing lesions in the liver measuring up to 10 mm, approximately 8-10 in number, suspicious for hepatic metastases.  No findings specific for metastatic disease in the chest.  Additional ancillary findings as above.    01/27/2017 - 10/20/2017 Chemotherapy   The patient had gemcitabine (GEMZAR) 1,292 mg in sodium chloride 0.9 % 250 mL chemo infusion, 800 mg/m2 = 1,292 mg, Intravenous,  Once, 9 of 12 cycles Administration: 1,292 mg (01/28/2017), 1,292 mg (02/11/2017), 1,292 mg (02/25/2017), 1,292 mg (03/11/2017), 1,292 mg (03/25/2017), 1,292 mg (04/08/2017), 1,292 mg (04/22/2017), 1,292 mg (04/29/2017), 1,292 mg (05/20/2017), 1,292 mg (06/03/2017), 1,292 mg (06/24/2017), 1,292 mg (07/08/2017), 1,292 mg (07/29/2017), 1,292 mg (08/12/2017), 1,292 mg (08/26/2017), 1,292 mg (09/09/2017), 1,292 mg (09/23/2017), 1,292 mg (10/07/2017)  for chemotherapy treatment.    10/21/2017 - 09/22/2018 Chemotherapy   The patient had leucovorin 700 mg in dextrose 5 % 250 mL infusion, 644 mg, Intravenous,  Once, 22 of 22 cycles Administration: 700 mg (10/21/2017), 700 mg (11/04/2017), 700 mg (11/18/2017), 700 mg (12/02/2017), 700 mg (12/16/2017), 700 mg (12/30/2017), 700 mg (01/13/2018), 700 mg (01/27/2018), 700 mg (02/10/2018), 700 mg (02/24/2018), 700 mg (03/10/2018), 700 mg (03/24/2018), 700 mg (04/28/2018), 700 mg (05/17/2018), 700 mg (05/31/2018), 700 mg (06/16/2018), 700 mg (06/30/2018), 700 mg (07/21/2018), 700 mg (08/04/2018), 700 mg (08/25/2018), 700 mg (09/08/2018) ondansetron (ZOFRAN) 8 mg in sodium chloride 0.9 % 50 mL IVPB, , Intravenous,  Once, 21 of 21 cycles Administration:  (11/04/2017),  (01/27/2018),  (02/10/2018),  (02/24/2018),  (03/10/2018),  (03/24/2018),  (04/28/2018),  (05/17/2018),  (05/31/2018),  (06/16/2018),  (06/30/2018),  (07/21/2018),  (08/04/2018),  (08/25/2018),  (09/08/2018) fluorouracil (ADRUCIL) 3,850 mg in sodium chloride 0.9 % 73 mL chemo infusion, 2,400 mg/m2 = 3,850 mg, Intravenous, 1  Day/Dose, 22 of 22 cycles Dose modification: 1,920 mg/m2 (original dose 2,400 mg/m2, Cycle 2, Reason: Provider Judgment, Comment: mucisitis) Administration: 3,850 mg (10/21/2017), 3,100 mg (11/04/2017), 3,100 mg (11/18/2017), 3,100 mg (12/02/2017), 3,100 mg (12/16/2017), 3,100 mg (12/30/2017), 3,100 mg (01/13/2018), 3,100 mg (01/27/2018), 3,100 mg (02/10/2018), 3,100 mg (02/24/2018), 3,100 mg (03/10/2018), 3,100 mg (03/24/2018), 3,100 mg (04/28/2018), 3,100 mg (05/17/2018), 3,100 mg (05/31/2018), 3,100 mg (06/16/2018), 3,100 mg (06/30/2018), 3,100 mg (07/21/2018), 3,100 mg (08/04/2018), 3,100 mg (08/25/2018), 3,100 mg (09/08/2018) irinotecan LIPOSOME (ONIVYDE) 81.7 mg in sodium chloride 0.9 % 500 mL chemo infusion, 50 mg/m2 = 81.7 mg (100 % of original dose 50 mg/m2), Intravenous, Once, 22 of 22 cycles Dose modification: 50 mg/m2 (original dose 50 mg/m2, Cycle 1, Reason: Provider Judgment), 50 mg/m2 (original dose 50 mg/m2, Cycle 2, Reason: Provider Judgment) Administration: 81.7 mg (10/21/2017), 81.7 mg (11/04/2017), 81.7 mg (11/18/2017), 81.7 mg (12/02/2017), 81.7 mg (12/16/2017), 81.7 mg (12/30/2017), 81.7 mg (01/13/2018), 81.7 mg (01/27/2018), 81.7 mg (02/10/2018), 81.7 mg (02/24/2018), 81.7 mg (03/10/2018), 81.7 mg (03/24/2018), 81.7 mg (04/28/2018), 81.7 mg (05/17/2018), 81.7 mg (05/31/2018), 81.7 mg (06/16/2018), 81.7 mg (06/30/2018), 81.7 mg (07/21/2018), 81.7 mg (08/04/2018), 81.7 mg (08/25/2018), 81.7 mg (09/08/2018)  for chemotherapy treatment.    09/22/2018 - 01/11/2019 Chemotherapy   The patient had palonosetron (ALOXI) injection 0.25 mg, 0.25 mg, Intravenous,  Once, 7 of 8 cycles Administration: 0.25 mg (09/22/2018), 0.25 mg (10/11/2018), 0.25 mg (10/25/2018), 0.25 mg (12/01/2018), 0.25 mg (12/15/2018), 0.25 mg (11/17/2018), 0.25 mg (12/29/2018)  pegfilgrastim (NEULASTA) injection 6 mg, 6 mg, Subcutaneous, Once, 5 of 5 cycles Administration: 6 mg (10/13/2018), 6 mg (10/27/2018), 6 mg (12/03/2018), 6 mg (12/17/2018), 6 mg  (11/19/2018) pegfilgrastim-cbqv (UDENYCA) injection 6 mg, 6 mg, Subcutaneous, Once, 1 of 2 cycles Administration: 6 mg (12/31/2018) leucovorin 628 mg in dextrose 5 % 250 mL infusion, 400 mg/m2 = 628 mg, Intravenous,  Once, 7 of 8 cycles Administration: 628 mg (09/22/2018), 628 mg (10/11/2018), 628 mg (10/25/2018), 628 mg (12/01/2018), 628 mg (12/15/2018), 628 mg (11/17/2018), 628 mg (12/29/2018) oxaliplatin (ELOXATIN) 100 mg in dextrose 5 % 500 mL chemo infusion, 65 mg/m2 = 100 mg (100 % of original dose 65 mg/m2), Intravenous,  Once, 7 of 8 cycles Dose modification: 65 mg/m2 (original dose 65 mg/m2, Cycle 1, Reason: Provider Judgment) Administration: 100 mg (09/22/2018), 100 mg (10/11/2018), 100 mg (10/25/2018), 100 mg (12/01/2018), 100 mg (12/15/2018), 100 mg (11/17/2018), 100 mg (12/29/2018) fluorouracil (ADRUCIL) chemo injection 500 mg, 320 mg/m2 = 500 mg (100 % of original dose 320 mg/m2), Intravenous,  Once, 7 of 8 cycles Dose modification: 320 mg/m2 (original dose 320 mg/m2, Cycle 1, Reason: Provider Judgment) Administration: 500 mg (09/22/2018), 500 mg (10/11/2018), 500 mg (10/25/2018), 500 mg (12/01/2018), 500 mg (12/15/2018), 500 mg (11/17/2018), 500 mg (12/29/2018) fluorouracil (ADRUCIL) 3,000 mg in sodium chloride 0.9 % 90 mL chemo infusion, 1,920 mg/m2 = 3,000 mg (100 % of original dose 1,920 mg/m2), Intravenous, 1 Day/Dose, 7 of 8 cycles Dose modification: 1,920 mg/m2 (original dose 1,920 mg/m2, Cycle 1, Reason: Provider Judgment) Administration: 3,000 mg (09/22/2018), 3,000 mg (10/11/2018), 3,000 mg (10/25/2018), 3,000 mg (12/01/2018), 3,000 mg (12/15/2018), 3,000 mg (11/17/2018), 3,000 mg (12/29/2018)  for chemotherapy treatment.    12/24/2018 Genetic Testing   Negative genetic testing on the common hereditary cancer panel.  The Common Hereditary Gene Panel offered by [facility] includes sequencing and/or deletion duplication testing of the following 48 genes: APC, ATM, AXIN2, BARD1, BMPR1A, BRCA1, BRCA2, BRIP1, CDH1, CDK4,  CDKN2A (p14ARF), CDKN2A (p16INK4a), CHEK2, CTNNA1, DICER1, EPCAM (Deletion/duplication testing only), GREM1 (promoter region deletion/duplication testing only), KIT, MEN1, MLH1, MSH2, MSH3, MSH6, MUTYH, NBN, NF1, NHTL1, PALB2, PDGFRA, PMS2, POLD1, POLE, PTEN, RAD50, RAD51C, RAD51D, RNF43, SDHB, SDHC, SDHD, SMAD4, SMARCA4. STK11, TP53, TSC1, TSC2, and VHL.  The following genes were evaluated for sequence changes only: SDHA and HOXB13 c.251G>A variant only. The report date is December 24, 2018.  DICER1 VUS identified.  This will not change medical decision making.   02/10/2019 -  Chemotherapy   The patient had palonosetron (ALOXI) injection 0.25 mg, 0.25 mg, Intravenous,  Once, 9 of 11 cycles Administration: 0.25 mg (02/10/2019), 0.25 mg (02/24/2019), 0.25 mg (03/10/2019), 0.25 mg (03/17/2019), 0.25 mg (03/31/2019), 0.25 mg (04/14/2019), 0.25 mg (05/12/2019), 0.25 mg (05/30/2019), 0.25 mg (06/14/2019), 0.25 mg (06/28/2019), 0.25 mg (07/13/2019), 0.25 mg (08/04/2019), 0.25 mg (08/18/2019), 0.25 mg (09/29/2019) pegfilgrastim-jmdb (FULPHILA) injection 6 mg, 6 mg, Subcutaneous,  Once, 7 of 9 cycles Administration: 6 mg (02/25/2019), 6 mg (03/18/2019), 6 mg (05/31/2019), 6 mg (07/14/2019), 6 mg (08/05/2019), 6 mg (08/19/2019) pegfilgrastim-cbqv (UDENYCA) injection 6 mg, 6 mg, Subcutaneous, Once, 1 of 1 cycle Administration: 6 mg (06/29/2019) CISplatin (PLATINOL) 31 mg in sodium chloride 0.9 % 250 mL chemo infusion, 20 mg/m2 = 31 mg (80 % of original dose 25 mg/m2), Intravenous,  Once, 8 of 10 cycles Dose modification: 20 mg/m2 (80 % of original dose 25 mg/m2, Cycle 1, Reason: Other (see comments), Comment: renal dysfunction) Administration: 31 mg (02/10/2019), 31 mg (02/24/2019), 31 mg (03/10/2019), 31  mg (03/17/2019), 31 mg (03/31/2019), 31 mg (04/14/2019), 31 mg (05/30/2019), 31 mg (06/14/2019), 31 mg (06/28/2019), 31 mg (07/13/2019), 31 mg (08/04/2019), 31 mg (08/18/2019) gemcitabine (GEMZAR) 1,140 mg in sodium chloride 0.9 % 250 mL chemo  infusion, 750 mg/m2 = 1,140 mg (75 % of original dose 1,000 mg/m2), Intravenous,  Once, 9 of 11 cycles Dose modification: 750 mg/m2 (75 % of original dose 1,000 mg/m2, Cycle 1, Reason: Provider Judgment) Administration: 1,140 mg (02/10/2019), 1,140 mg (02/24/2019), 1,140 mg (03/10/2019), 1,140 mg (03/17/2019), 1,140 mg (03/31/2019), 1,140 mg (04/14/2019), 1,140 mg (05/12/2019), 1,140 mg (05/30/2019), 1,140 mg (06/14/2019), 1,140 mg (06/28/2019), 1,140 mg (07/13/2019), 1,140 mg (08/04/2019), 1,140 mg (08/18/2019), 1,140 mg (09/29/2019) fosaprepitant (EMEND) 150 mg, dexamethasone (DECADRON) 12 mg in sodium chloride 0.9 % 145 mL IVPB, , Intravenous,  Once, 9 of 11 cycles Administration:  (02/10/2019),  (02/24/2019),  (03/10/2019),  (03/17/2019),  (03/31/2019),  (04/14/2019),  (05/12/2019),  (05/30/2019),  (06/14/2019),  (06/28/2019),  (07/13/2019),  (08/04/2019),  (08/18/2019)  for chemotherapy treatment.      CANCER STAGING: Cancer Staging Pancreatic cancer metastasized to liver [facility]) Staging form: Pancreas, AJCC 7th Edition - Clinical: Stage IV (T3, N1, M1) - Signed by Baird Cancer, PA-C on 03/16/2014 - Pathologic: No stage assigned - Unsigned   INTERVAL HISTORY:  [name], a 86 y.o. female, returns for routine follow-up and consideration for next cycle of chemotherapy. Sareen was last seen on 10/13/2019.  Due for cycle #10 of cisplatin, gemcitabine and Aloxi today.   Today she is with her son and reports feeling so-so. She reports having pain in her bilat rib cage and back. She takes 2 tablets of Tramadol every day, at 7AM and 7PM. She also uses pain patches She reports having no appetite. She drinks milk shakes, but no protein shakes, and will only take 2 bites of her meals; the only thing she will eat entirely is a hot dog. She is drinking water.  Overall, she feels ready for next cycle of chemo today.    REVIEW OF SYSTEMS:  Review of Systems  Constitutional: Positive for appetite change  (severely decreased) and fatigue (severe).  Gastrointestinal: Positive for constipation and nausea.  Musculoskeletal: Positive for back pain (4/10 back and rib cage pain).  Neurological: Positive for dizziness.  All other systems reviewed and are negative.   PAST MEDICAL/SURGICAL HISTORY:  Past Medical History:  Diagnosis Date  . Anemia due to antineoplastic chemotherapy 09/12/2015   Started Aranesp 500 mcg on 09/12/2015  . Anxiety   . Chronic diarrhea   . Depression   . DNR (do not resuscitate) 08/16/2015  . Erosive esophagitis   . Family history of breast cancer in female   . Family history of prostate cancer   . GERD (gastroesophageal reflux disease)   . Hx of adenomatous colonic polyps    tubular adenomas, last found in 2008  . Hyperplastic colon polyp 03/19/10   tcs by Dr. Gala Romney  . Hypertension 20 years   . Kidney stone    hx/ crushed   . Opioid contract exists 04/18/2015   With Dr. Moshe Cipro  . Pancreatic cancer ([facility]) 02/2014  . Pancreatic cancer metastasized to liver ([facility]) 03/10/2014  . Schatzki's ring 08/26/10   Last dilated on EGD by Dr. Trevor Iha [facility], linear gastric erosions, BI hemigastrectomy   Past Surgical History:  Procedure Laterality Date  . BREAST LUMPECTOMY Left   . bunion removal     from both feet   . CHOLECYSTECTOMY  1965   .  COLONOSCOPY  03/19/2010   DR Gala Romney,, normal TI, pancolonic diverticula, random colon bx neg., hyperplastic polyps removed  . ESOPHAGOGASTRODUODENOSCOPY  11/22/2003   DR Gala Romney, erosive RE, Billroth I  . ESOPHAGOGASTRODUODENOSCOPY  08/26/10   Dr. Gala Romney- moderate severe ERE, Scahtzki ring s/p dilation, Billroth I, linear gastric erosions, bx-gastric xanthelasma  . LEFT SHOULDER SURGERY  2009   DR HARRISON  . ORIF ANKLE FRACTURE Right 05/22/2013   Procedure: OPEN REDUCTION INTERNAL FIXATION (ORIF) RIGHT ANKLE FRACTURE;  Surgeon: Sanjuana Kava, MD;  Location: [facility];  Service: Orthopedics;  Laterality: Right;  . stomach ulcer  50 years ago     had some of her stomach removed     SOCIAL HISTORY:  Social History   Socioeconomic History  . Marital status: Widowed    Spouse name: Not on file  . Number of children: 2  . Years of education: 15  . Highest education level: 12th grade  Occupational History  . Occupation: retired from Estate manager/land agent: RETIRED  Tobacco Use  . Smoking status: Current Some Day Smoker    Packs/day: 0.50    Years: 60.00    Pack years: 30.00    Types: Cigarettes    Last attempt to quit: 09/09/2016    Years since quitting: 3.1  . Smokeless tobacco: Never Used  Vaping Use  . Vaping Use: Never used  Substance and Sexual Activity  . Alcohol use: No  . Drug use: No  . Sexual activity: Not Currently  Other Topics Concern  . Not on file  Social History Narrative   Lives with son alone    Social Determinants of Health   Financial Resource Strain:   . Difficulty of Paying Living Expenses:   Food Insecurity:   . Worried About Charity fundraiser in the Last Year:   . Arboriculturist in the Last Year:   Transportation Needs:   . Film/video editor (Medical):   Marland Kitchen Lack of Transportation (Non-Medical):   Physical Activity:   . Days of Exercise per Week:   . Minutes of Exercise per Session:   Stress:   . Feeling of Stress :   Social Connections:   . Frequency of Communication with Friends and Family:   . Frequency of Social Gatherings with Friends and Family:   . Attends Religious Services:   . Active Member of Clubs or Organizations:   . Attends Archivist Meetings:   Marland Kitchen Marital Status:   Intimate Partner Violence:   . Fear of Current or Ex-Partner:   . Emotionally Abused:   Marland Kitchen Physically Abused:   . Sexually Abused:     FAMILY HISTORY:  Family History  Problem Relation Age of Onset  . Hypertension Mother   . Kidney failure Brother        on dialysis  . Diabetes Sister   . Diabetes Sister   . Hypertension Father   . Kidney failure Sister   . Kidney Stones Brother    . Breast cancer Son 31  . Gout Son 19  . Gout Nephew 57  . Liver disease Neg Hx   . Colon cancer Neg Hx     CURRENT MEDICATIONS:  Current Outpatient Medications  Medication Sig Dispense Refill  . amLODipine (NORVASC) 10 MG tablet TAKE 1 TABLET DAILY 90 tablet 3  . CISPLATIN IV Inject into the vein. Days 1, 8 every 21 days    . clobetasol (TEMOVATE) 0.05 % external solution APP AA  ON SCALP BID PRN NOT TO FACE GROIN OR UNDERARMS    . clonazePAM (KLONOPIN) 0.5 MG tablet Take 1 tablet (0.5 mg total) by mouth at bedtime. 90 tablet 1  . cycloSPORINE (RESTASIS) 0.05 % ophthalmic emulsion Place 1 drop into both eyes 2 (two) times daily.    . ergocalciferol (VITAMIN D2) 1.25 MG (50000 UT) capsule Take 1 capsule (50,000 Units total) by mouth once a week. 4 capsule 3  . fluorometholone (FML) 0.1 % ophthalmic suspension INSTILL 1 DROP INTO EACH EYE THREE TIMES DAILY FOR 14 DAYS  2  . fluticasone (FLONASE) 50 MCG/ACT nasal spray Place 1 spray into both nostrils daily.    Marland Kitchen GEMCITABINE HCL IV Inject into the vein. Days 1, 8 every 21 days    . lidocaine (XYLOCAINE) 2 % solution RINSE WITH 5ML AS NEEDED TO RELIEVE MOUTH PAIN.    . metoprolol tartrate (LOPRESSOR) 25 MG tablet TAKE 1 TABLET DAILY FOR BLOOD PRESSURE (DOSE REDUCTION) 90 tablet 3  . mirtazapine (REMERON) 15 MG tablet Take 15 mg by mouth at bedtime.    Marland Kitchen MYRBETRIQ 50 MG TB24 tablet Take 50 mg by mouth daily.    Marland Kitchen oxybutynin (DITROPAN-XL) 10 MG 24 hr tablet TAKE 1 TABLET(5 MG) BY MOUTH AT BEDTIME 30 tablet 6  . pantoprazole (PROTONIX) 20 MG tablet TAKE 1 TABLET DAILY 90 tablet 3  . senna-docusate (SENOKOT-S) 8.6-50 MG tablet Take 1 tablet by mouth daily.    Marland Kitchen acetaminophen (TYLENOL) 500 MG tablet Take 500 mg by mouth every 6 (six) hours as needed for mild pain or moderate pain.  (Patient not taking: Reported on 10/27/2019)    . fluticasone (CUTIVATE) 0.05 % cream APPLY TO THE AFFECTED AREA FACE TWICE DAILY AS NEEDED (Patient not taking:  Reported on 10/27/2019)    . lidocaine-prilocaine (EMLA) cream Apply 1 application topically as needed. Apply to portacath site as needed (Patient not taking: Reported on 10/27/2019) 30 g 1  . loperamide (IMODIUM A-D) 2 MG tablet Take 2 mg by mouth 4 (four) times daily as needed for diarrhea or loose stools. (Patient not taking: Reported on 10/27/2019)    . traMADol (ULTRAM) 50 MG tablet Take 1 tablet (50 mg total) by mouth every 12 (twelve) hours as needed. (Patient not taking: Reported on 10/27/2019) 30 tablet 0   No current facility-administered medications for this visit.    ALLERGIES:  Allergies  Allergen Reactions  . Iohexol Hives and Shortness Of Breath    **Patient can not have IV contrast, pt has breakthrough reaction even after 13 hour premeds** Do not give IV contrast PATIENT HAD TO BE TAKEN TO ED, C/O WHELPS, HIVES, DIFFICULTY BREATHING. PATIENT GIVEN IV CONTRAST AFTER 13 HOUR PRE MEDS ON 01/09/2017 WITH NO REACTION NOTED. On 10/19/17 patient had reaction despite premedication.    Marland Kitchen Aciphex [Rabeprazole Sodium] Other (See Comments)    unknown  . Amlodipine Besylate-Valsartan     Rash to high-dose (5/320)  . Esomeprazole Magnesium   . Omeprazole Other (See Comments)    Patient states "medication didn't work"  . Ciprofloxacin Rash  . Penicillins Swelling and Rash    Has patient had a PCN reaction causing immediate rash, facial/tongue/throat swelling, SOB or lightheadedness with hypotension: Yes Has patient had a PCN reaction causing severe rash involving mucus membranes or skin necrosis: Yes Has patient had a PCN reaction that required hospitalization: No Has patient had a PCN reaction occurring within the last 10 years: yes If all of the above answers  are "NO", then may proceed with Cephalosporin use.   [name] [Benzonatate] Itching    Full head, face, neck, and chest itching.     PHYSICAL EXAM:  Performance status (ECOG): 1 - Symptomatic but completely  ambulatory  Vitals:   10/27/19 0814  BP: (!) 139/58  Pulse: (!) 56  Resp: 18  Temp: (!) 96.4 F (35.8 C)  SpO2: 100%   Wt Readings from Last 3 Encounters:  10/27/19 97 lb (44 kg)  10/13/19 99 lb (44.9 kg)  10/13/19 98 lb 14.4 oz (44.9 kg)   Physical Exam  LABORATORY DATA:  I have reviewed the labs as listed.  CBC Latest Ref Rng & Units 10/27/2019 10/13/2019 09/29/2019  WBC 4.0 - 10.5 K/uL 5.6 3.8(L) 5.8  Hemoglobin 12.0 - 15.0 g/dL 9.5(L) 8.6(L) 8.7(L)  Hematocrit 36 - 46 % 28.3(L) 26.1(L) 26.7(L)  Platelets 150 - 400 K/uL 159 64(L) 162   CMP Latest Ref Rng & Units 10/27/2019 10/13/2019 09/29/2019  Glucose 70 - 99 mg/dL 115(H) 102(H) 116(H)  BUN 8 - 23 mg/dL 31(H) 27(H) 32(H)  Creatinine 0.44 - 1.00 mg/dL 1.69(H) 1.69(H) 1.54(H)  Sodium 135 - 145 mmol/L 138 138 140  Potassium 3.5 - 5.1 mmol/L 3.5 3.7 3.5  Chloride 98 - 111 mmol/L 103 104 106  CO2 22 - 32 mmol/L _0 Calcium 8.9 - 10.3 mg/dL 8.3(L) 8.3(L) 8.2(L)  Total Protein 6.5 - 8.1 g/dL 7.6 7.2 7.1  Total Bilirubin 0.3 - 1.2 mg/dL 0.7 0.8 1.0  Alkaline Phos 38 - 126 U/L 293(H) 289(H) 283(H)  AST 15 - 41 U/L 87(H) 37 117(H)  ALT 0 - 44 U/L 48(H) 34 124(H)    DIAGNOSTIC IMAGING:  I have independently reviewed the scans and discussed with the patient. No results found.   ASSESSMENT:  1. Metastatic pancreatic cancer to the liver: -Gemcitabine and Abraxane from November 2015 through August 2018 with progression. -Gemcitabine and Tarceva from 01/28/2017 through 10/07/2017 with progression. -21 cycles of infusional 5-FU and Onivyde from 10/21/2017 through 09/08/2018. -7 cycles of FOLFOX from 09/22/2018 through 12/29/2018. -Gemcitabine and cisplatin day 1 and day 15 started on 02/10/2019. -CT CAP on 09/16/2019 showed mass in the posterior liver dome measuring 4.6 x 3.6 cm compared to 4 x 3.2 cm on 06/01/2019.  Left lobe mass also improved with 3 mm.  No new hepatic lesions.  Study limited by lack of IV contrast.  No new areas of  metastatic disease.   PLAN:  1. Metastatic pancreatic cancer to the liver: -I have reviewed her labs.  Creatinine is elevated and stable at 1.69. -AST and ALT are mildly elevated at 87 and 48.  Total bilirubin was normal. -I will hold her cisplatin.  We will dose reduce gemcitabine.  She will also receive IV fluids. -We will reevaluate her in 2 to 3 weeks. -As she had shown great response to treatments in the past years, we are continuing recycled palliative chemotherapy.  If she continues to decline, we will consider palliative care with hospice.  2. Hypomagnesemia: -Magnesium today is 1.8.  3. Left lateral chest wall pain: -She has pain under bilateral ribs on the lateral side. -I will increase tramadol to 50 mg 3 times a day.  4. Overactive bladder: -Continue oxybutynin 10 milligrams daily.  5. Weight loss: -She lost 2 pounds in the last 2 weeks.  Continue Remeron 15 mg at bedtime.  6. Vitamin D deficiency: -Continue vitamin D supplements.   Orders placed this encounter:  No orders of the defined types were placed in this encounter.    Derek Jack, MD [place] [phone]   I, Milinda Antis, am acting as a scribe for Dr. Sanda Linger.  I, Derek Jack MD, have reviewed the above documentation for accuracy and completeness, and I agree with the above.

## 2019-10-28 ENCOUNTER — Ambulatory Visit (HOSPITAL_COMMUNITY): Payer: Medicare HMO

## 2019-10-28 LAB — CANCER ANTIGEN 19-9: CA 19-9: 1232 U/mL — ABNORMAL HIGH (ref 0–35)

## 2019-11-07 ENCOUNTER — Ambulatory Visit (INDEPENDENT_AMBULATORY_CARE_PROVIDER_SITE_OTHER): Payer: Medicare HMO | Admitting: Family Medicine

## 2019-11-07 ENCOUNTER — Other Ambulatory Visit: Payer: Self-pay

## 2019-11-07 ENCOUNTER — Encounter: Payer: Self-pay | Admitting: Family Medicine

## 2019-11-07 VITALS — BP 134/65 | HR 60 | Temp 96.6°F | Resp 16 | Ht 63.0 in | Wt 94.0 lb

## 2019-11-07 DIAGNOSIS — I10 Essential (primary) hypertension: Secondary | ICD-10-CM | POA: Diagnosis not present

## 2019-11-07 DIAGNOSIS — Z9181 History of falling: Secondary | ICD-10-CM | POA: Diagnosis not present

## 2019-11-07 DIAGNOSIS — R636 Underweight: Secondary | ICD-10-CM | POA: Diagnosis not present

## 2019-11-07 DIAGNOSIS — N3001 Acute cystitis with hematuria: Secondary | ICD-10-CM | POA: Diagnosis not present

## 2019-11-07 LAB — POCT URINALYSIS DIP (CLINITEK)
Bilirubin, UA: NEGATIVE
Glucose, UA: NEGATIVE mg/dL
Ketones, POC UA: NEGATIVE mg/dL
Nitrite, UA: NEGATIVE
POC PROTEIN,UA: >=300 — AB
Spec Grav, UA: 1.02 (ref 1.010–1.025)
Urobilinogen, UA: 1 E.U./dL
pH, UA: 7 (ref 5.0–8.0)

## 2019-11-07 MED ORDER — DOXYCYCLINE HYCLATE 50 MG PO CAPS
50.0000 mg | ORAL_CAPSULE | Freq: Two times a day (BID) | ORAL | 0 refills | Status: AC
Start: 2019-11-07 — End: 2019-11-12

## 2019-11-07 NOTE — Assessment & Plan Note (Signed)
Reports poor appetite and reduced intake with weight loss, encouraged to eat small portions often, currently taking reneron at low dose, megace a consideration

## 2019-11-07 NOTE — Progress Notes (Signed)
   [name]     MRN: [id number]      DOB: 31-Mar-1929   HPI [name] is here with a 4 day h/o painful urination, she denies fever , chills or flank pain C/o poor appetite and weight loss, nothing tastes good, also recently lost a sister C/o chronic upper abdominal and lower rib cage pain, Oncology is managing her pain ROS Denies recent fever or chills. Denies sinus pressure, nasal congestion, ear pain or sore throat. Denies chest congestion, productive cough or wheezing. Denies chest pains, palpitations and leg swelling . C/o  joint pain, and limitation in mobility.Ambulates with a cane Denies headaches, seizures, numbness, or tingling.  PE  BP 134/65   Pulse 60   Temp (!) 96.6 F (35.9 C)   Resp 16   Ht 5\' 3"  (1.6 m)   Wt 94 lb 0.6 oz (42.7 kg)   SpO2 100%   BMI 16.66 kg/m   Patient alert and oriented and in no cardiopulmonary distress.Chronicall ill appearing with significant weight loss  HEENT: No facial asymmetry, EOMI,     Neck supple .  Chest: Clear to auscultation bilaterally.  CVS: S1, S2 no murmurs, no S3.Regular rate.  ABD: Soft mild suprapubic tenderness, no guarding or rebound, no flank tendrness  Ext: No edema  MS: Decreased  ROM spine, shoulders, hips and knees.  .  CNS: CN 2-12 intact, power,  normal throughout.no focal deficits noted.   Assessment & Plan Acute cystitis with hematuria Symptomatic with abn CCUA, start 5 day course of doxycycline and f/u culture  At high risk for falls Home safety briefly reviewed  Essential hypertension Controlled, no change in medication   Underweight due to inadequate caloric intake Reports poor appetite and reduced intake with weight loss, encouraged to eat small portions often, currently taking reneron at low dose, megace a consideration

## 2019-11-07 NOTE — Assessment & Plan Note (Signed)
Controlled, no change in medication  

## 2019-11-07 NOTE — Assessment & Plan Note (Signed)
Home safety briefly reviewed

## 2019-11-07 NOTE — Patient Instructions (Signed)
F/u I July as before, call if you need me sooner.  I am treating you for a bladder infection. Please ensure you drink water often  Antibiotic, doxycycline is prescribed for 5 days  Tylenol,one  Tylenol in office before checkout  Thanks for choosing [facility], we consider it a privelige to serve you.

## 2019-11-07 NOTE — Assessment & Plan Note (Signed)
Symptomatic with abn CCUA, start 5 day course of doxycycline and f/u culture

## 2019-11-08 ENCOUNTER — Other Ambulatory Visit (HOSPITAL_COMMUNITY)
Admission: RE | Admit: 2019-11-08 | Discharge: 2019-11-08 | Disposition: A | Discharge status: Home / Self Care | Payer: Medicare HMO | Source: Other Acute Inpatient Hospital | Attending: Family Medicine | Admitting: Family Medicine

## 2019-11-08 DIAGNOSIS — N3001 Acute cystitis with hematuria: Secondary | ICD-10-CM | POA: Insufficient documentation

## 2019-11-10 ENCOUNTER — Inpatient Hospital Stay (HOSPITAL_COMMUNITY): Payer: Medicare HMO | Attending: Hematology

## 2019-11-10 ENCOUNTER — Inpatient Hospital Stay (HOSPITAL_BASED_OUTPATIENT_CLINIC_OR_DEPARTMENT_OTHER): Payer: Medicare HMO | Admitting: Nurse Practitioner

## 2019-11-10 ENCOUNTER — Inpatient Hospital Stay (HOSPITAL_COMMUNITY): Payer: Medicare HMO

## 2019-11-10 DIAGNOSIS — C259 Malignant neoplasm of pancreas, unspecified: Secondary | ICD-10-CM

## 2019-11-10 DIAGNOSIS — R5383 Other fatigue: Secondary | ICD-10-CM | POA: Diagnosis not present

## 2019-11-10 DIAGNOSIS — C787 Secondary malignant neoplasm of liver and intrahepatic bile duct: Secondary | ICD-10-CM | POA: Insufficient documentation

## 2019-11-10 DIAGNOSIS — C252 Malignant neoplasm of tail of pancreas: Secondary | ICD-10-CM | POA: Insufficient documentation

## 2019-11-10 LAB — CBC WITH DIFFERENTIAL/PLATELET
Abs Immature Granulocytes: 0.01 10*3/uL (ref 0.00–0.07)
Basophils Absolute: 0 10*3/uL (ref 0.0–0.1)
Basophils Relative: 1 %
Eosinophils Absolute: 0.1 10*3/uL (ref 0.0–0.5)
Eosinophils Relative: 1 %
HCT: 25.4 % — ABNORMAL LOW (ref 36.0–46.0)
Hemoglobin: 8.4 g/dL — ABNORMAL LOW (ref 12.0–15.0)
Immature Granulocytes: 0 %
Lymphocytes Relative: 20 %
Lymphs Abs: 0.8 10*3/uL (ref 0.7–4.0)
MCH: 31.6 pg (ref 26.0–34.0)
MCHC: 33.1 g/dL (ref 30.0–36.0)
MCV: 95.5 fL (ref 80.0–100.0)
Monocytes Absolute: 0.3 10*3/uL (ref 0.1–1.0)
Monocytes Relative: 9 %
Neutro Abs: 2.8 10*3/uL (ref 1.7–7.7)
Neutrophils Relative %: 69 %
Platelets: 106 10*3/uL — ABNORMAL LOW (ref 150–400)
RBC: 2.66 MIL/uL — ABNORMAL LOW (ref 3.87–5.11)
RDW: 15 % (ref 11.5–15.5)
WBC: 4 10*3/uL (ref 4.0–10.5)
nRBC: 0 % (ref 0.0–0.2)

## 2019-11-10 LAB — COMPREHENSIVE METABOLIC PANEL
ALT: 21 U/L (ref 0–44)
AST: 24 U/L (ref 15–41)
Albumin: 3.1 g/dL — ABNORMAL LOW (ref 3.5–5.0)
Alkaline Phosphatase: 188 U/L — ABNORMAL HIGH (ref 38–126)
Anion gap: 11 (ref 5–15)
BUN: 21 mg/dL (ref 8–23)
CO2: 30 mmol/L (ref 22–32)
Calcium: 8.1 mg/dL — ABNORMAL LOW (ref 8.9–10.3)
Chloride: 93 mmol/L — ABNORMAL LOW (ref 98–111)
Creatinine, Ser: 1.8 mg/dL — ABNORMAL HIGH (ref 0.44–1.00)
GFR calc Af Amer: 29 mL/min — ABNORMAL LOW (ref >60)
GFR calc non Af Amer: 25 mL/min — ABNORMAL LOW (ref >60)
Glucose, Bld: 98 mg/dL (ref 70–99)
Potassium: 3.5 mmol/L (ref 3.5–5.1)
Sodium: 134 mmol/L — ABNORMAL LOW (ref 135–145)
Total Bilirubin: 1 mg/dL (ref 0.3–1.2)
Total Protein: 6.7 g/dL (ref 6.5–8.1)

## 2019-11-10 LAB — MAGNESIUM: Magnesium: 1.7 mg/dL (ref 1.7–2.4)

## 2019-11-10 MED ORDER — SODIUM CHLORIDE 0.9 % IV SOLN: INTRAVENOUS | Status: DC

## 2019-11-10 MED ORDER — HEPARIN SOD (PORK) LOCK FLUSH 100 UNIT/ML IV SOLN
500.0000 [IU] | Freq: Once | INTRAVENOUS | Status: AC
  Administered 2019-11-10: 500 [IU] via INTRAVENOUS

## 2019-11-10 MED ORDER — SODIUM CHLORIDE 0.9% FLUSH
10.0000 mL | Freq: Once | INTRAVENOUS | Status: AC
  Administered 2019-11-10: 10 mL via INTRAVENOUS

## 2019-11-10 NOTE — Progress Notes (Signed)
Labs reviewed today , no treatment today per Francene Finders NP. Will return in 2 weeks per NP. Will give one liter of saline over one hour today per orders.

## 2019-11-10 NOTE — Assessment & Plan Note (Addendum)
1. Metastatic pancreatic cancer to the liver: -Gemcitabine and Abraxane from November 2015 through August 2018 with progression. -Gemcitabine and Tarceva from 01/28/2017 through 10/07/2017 with progression. -21 cycles of infusional 5-FU and Onivyde from 10/21/2017 through 09/08/2018. -7 cycles of FOLFOX from 09/22/2018 through 01/29/2019. -Gemcitabine and cisplatin day 1 and day 15 started on 02/10/2019. -CT CAP on 09/16/2019 showed mass in the posterior liver dome measuring 4.6 x 3.6 cm compared to 4 x 3.2 cm on 06/01/2019. Left lobe mass also improved with 3 mm. No new hepatic lesions. Study limited by lack of IV contrast. No new areas of metastatic disease. -She has shown great response to treatments in the past years, we are continuing recycle palliative chemotherapy. If she continues to decline we will consider palliative care with hospice. -Her last treatment her cisplatin was held and her gemcitabine was dose reduced. -Labs today show her creatinine is 1.80.  Hemoglobin 8.4. -She reports she is still on antibiotics from a bad kidney infection she had earlier this week. -We will hold her treatment today and give her a liter of IV fluids. -We will see her back in 2 weeks to resume treatment.  2. Hypomagnesemia: -Labs done 11/10/2019 on showed magnesium 1.7  3. Left lateral chest wall pain: -She has pain under her bilateral ribs on the lateral side. -She will take tramadol 50 mg 3 times a day.  4. Overactive bladder: -Continue oxybutynin 10 mg daily.  5. Weight loss: -She is lost another 3 pounds since her last visit. -Continue Remeron 15 mg at bedtime  6. Vitamin D deficiency: -Continue vitamin D supplements.

## 2019-11-10 NOTE — Patient Instructions (Signed)
[facility]  Discharge Instructions:   _______________________________________________________________  Thank you for choosing [facility] at [facility] to provide your oncology and hematology care.  To afford each patient quality time with our providers, please arrive at least 15 minutes before your scheduled appointment.  You need to re-schedule your appointment if you arrive 10 or more minutes late.  We strive to give you quality time with our providers, and arriving late affects you and other patients whose appointments are after yours.  Also, if you no show three or more times for appointments you may be dismissed from the clinic.  Again, thank you for choosing [facility] at [facility]. Our hope is that these requests will allow you access to exceptional care and in a timely manner. _______________________________________________________________  If you have questions after your visit, please contact our office at [phone] between the hours of 8:30 a.m. and 5:00 p.m. Voicemails left after 4:30 p.m. will not be returned until the following business day. _______________________________________________________________  For prescription refill requests, have your pharmacy contact our office. _______________________________________________________________  Recommendations made by the consultant and any test results will be sent to your referring physician. _______________________________________________________________ 

## 2019-11-10 NOTE — Progress Notes (Signed)
[facility] [place], [place] [address]   CLINIC:  Medical Oncology/Hematology  PCP:  [name] Helper, MD [address] [place] [address] [phone]   REASON FOR VISIT: Follow-up for pancreatic cancer metastasized to the liver  NGS Results: Foundation 1 MS-stable, TMB cannot be determined, K-ras G 12 V, T p53 mutation.  CURRENT THERAPY: cisplatin, gemcitabine and Aloxi   BRIEF ONCOLOGIC HISTORY:  Oncology History  Pancreatic cancer metastasized to liver ([facility])  03/10/2014 Initial Diagnosis   Pancreatic cancer metastasized to liver   03/22/2014 - 03/05/2016 Chemotherapy   Abraxane/Gemzar days 1, 8, every 28 days.  Day 15 was cancelled due to leukopenia and thrombocytopenia on day 15 cycle 1.   05/24/2014 Treatment Plan Change   Day 8 of cycle 3 is held with ANC of 1.1   06/05/2014 Imaging   CT C/A/P . Interval decrease in size of the pancreatic tail mass.Improved hepatic metastatic disease. No new lesions. No CT findings for metastatic disease involving the chest.   08/09/2014 Tumor Marker   CA 19-9= 33 (WNL)   10/26/2014 Imaging   MRI- Continued interval decrease in size of the hepatic metastatic lesions and no new lesions are identified. Continued decrease in size of the pancreatic tail lesion.   01/03/2015 Tumor Marker   Results for [name], [name] as of 01/18/2015 12:52  01/03/2015 10:00 CA 19-9: 14    01/17/2015 Imaging   MRI- Response to therapy of hepatic metastasis.  Similar size of a pancreatic tail lesion.   03/20/2015 Imaging   MRI-L spine- Severe disc space narrowing at L2-L3, with endplate reactive changes. Large disc extrusion into the ventral epidural space,central to the RIGHT with a cephalad migrated free fragment. Additional Large disc extrusion into the retroperitone...   03/22/2015 Imaging   CT pelvis- No evidence for metastatic disease within the pelvis.   07/24/2015 Imaging   MRI abd-  Continued response to therapy, with no residual detectable liver metastases. No new sites of metastatic disease in the abdomen.    08/15/2015 Code Status    She confirms desire for DNR status.   12/21/2015 Imaging   MRI liver- Severe image degradation due to motion artifact reducing diagnostic sensitivity and specificity. Reduce conspicuity of the pancreatic tail lesion suggesting further improvement. The original liver lesions have resolved.   03/05/2016 Treatment Plan Change   Chemotherapy holiday/Break after 2 years worth of treatment   04/07/2016 Imaging   CT chest- When compared to recent chest CT, new minimally displaced anterior left sixth rib fracture. Slight increase in subcarinal adenopathy   04/28/2016 Imaging   Bone density- BMD as determined from Femur Neck Right is 0.705 g/cm2 with a T-Score of -2.4. This patient is considered osteopenic according to [facility]) criteria. Compared with the prior study on 02/23/2013, the BMD of the lumbar spine/rt. femoral neck show a statistically significant decrease.    05/23/2016 Imaging   MRI abd- 1. Exam is significantly degraded by patient respiratory motion. Consider follow-up exams with CT abdomen with without contrast per pancreatic protocol. 2. Fullness in tail of pancreas is more prominent and potentially increased in size. Recommend close attention on follow-up. (Consider CT as above) 3. No explanation for back pain.   09/11/2016 Imaging   MRI pancreas- Interval progression of pancreatic tail lesion. New differential perfusion right liver with areas of heterogeneity. Underlying metastatic disease in this region not excluded.   09/11/2016 Progression   MRi  in conjunction with rising CA 19-9 are indicative of relapse of disease.   01/09/2017 Progression   CT C/A/P: 2.1 x 2.9 cm lesion in the pancreatic tail and abutting the splenic hilum, corresponding to known primary pancreatic neoplasm,  mildly increased.  Scattered small hypoenhancing lesions in the liver measuring up to 10 mm, approximately 8-10 in number, suspicious for hepatic metastases.  No findings specific for metastatic disease in the chest.  Additional ancillary findings as above.    01/27/2017 - 10/20/2017 Chemotherapy   The patient had gemcitabine (GEMZAR) 1,292 mg in sodium chloride 0.9 % 250 mL chemo infusion, 800 mg/m2 = 1,292 mg, Intravenous,  Once, 9 of 12 cycles Administration: 1,292 mg (01/28/2017), 1,292 mg (02/11/2017), 1,292 mg (02/25/2017), 1,292 mg (03/11/2017), 1,292 mg (03/25/2017), 1,292 mg (04/08/2017), 1,292 mg (04/22/2017), 1,292 mg (04/29/2017), 1,292 mg (05/20/2017), 1,292 mg (06/03/2017), 1,292 mg (06/24/2017), 1,292 mg (07/08/2017), 1,292 mg (07/29/2017), 1,292 mg (08/12/2017), 1,292 mg (08/26/2017), 1,292 mg (09/09/2017), 1,292 mg (09/23/2017), 1,292 mg (10/07/2017)  for chemotherapy treatment.    10/21/2017 - 09/22/2018 Chemotherapy   The patient had leucovorin 700 mg in dextrose 5 % 250 mL infusion, 644 mg, Intravenous,  Once, 22 of 22 cycles Administration: 700 mg (10/21/2017), 700 mg (11/04/2017), 700 mg (11/18/2017), 700 mg (12/02/2017), 700 mg (12/16/2017), 700 mg (12/30/2017), 700 mg (01/13/2018), 700 mg (01/27/2018), 700 mg (02/10/2018), 700 mg (02/24/2018), 700 mg (03/10/2018), 700 mg (03/24/2018), 700 mg (04/28/2018), 700 mg (05/17/2018), 700 mg (05/31/2018), 700 mg (06/16/2018), 700 mg (06/30/2018), 700 mg (07/21/2018), 700 mg (08/04/2018), 700 mg (08/25/2018), 700 mg (09/08/2018) ondansetron (ZOFRAN) 8 mg in sodium chloride 0.9 % 50 mL IVPB, , Intravenous,  Once, 21 of 21 cycles Administration:  (11/04/2017),  (01/27/2018),  (02/10/2018),  (02/24/2018),  (03/10/2018),  (03/24/2018),  (04/28/2018),  (05/17/2018),  (05/31/2018),  (06/16/2018),  (06/30/2018),  (07/21/2018),  (08/04/2018),  (08/25/2018),  (09/08/2018) fluorouracil (ADRUCIL) 3,850 mg in sodium chloride 0.9 % 73 mL chemo infusion, 2,400 mg/m2 = 3,850 mg, Intravenous, 1  Day/Dose, 22 of 22 cycles Dose modification: 1,920 mg/m2 (original dose 2,400 mg/m2, Cycle 2, Reason: Provider Judgment, Comment: mucisitis) Administration: 3,850 mg (10/21/2017), 3,100 mg (11/04/2017), 3,100 mg (11/18/2017), 3,100 mg (12/02/2017), 3,100 mg (12/16/2017), 3,100 mg (12/30/2017), 3,100 mg (01/13/2018), 3,100 mg (01/27/2018), 3,100 mg (02/10/2018), 3,100 mg (02/24/2018), 3,100 mg (03/10/2018), 3,100 mg (03/24/2018), 3,100 mg (04/28/2018), 3,100 mg (05/17/2018), 3,100 mg (05/31/2018), 3,100 mg (06/16/2018), 3,100 mg (06/30/2018), 3,100 mg (07/21/2018), 3,100 mg (08/04/2018), 3,100 mg (08/25/2018), 3,100 mg (09/08/2018) irinotecan LIPOSOME (ONIVYDE) 81.7 mg in sodium chloride 0.9 % 500 mL chemo infusion, 50 mg/m2 = 81.7 mg (100 % of original dose 50 mg/m2), Intravenous, Once, 22 of 22 cycles Dose modification: 50 mg/m2 (original dose 50 mg/m2, Cycle 1, Reason: Provider Judgment), 50 mg/m2 (original dose 50 mg/m2, Cycle 2, Reason: Provider Judgment) Administration: 81.7 mg (10/21/2017), 81.7 mg (11/04/2017), 81.7 mg (11/18/2017), 81.7 mg (12/02/2017), 81.7 mg (12/16/2017), 81.7 mg (12/30/2017), 81.7 mg (01/13/2018), 81.7 mg (01/27/2018), 81.7 mg (02/10/2018), 81.7 mg (02/24/2018), 81.7 mg (03/10/2018), 81.7 mg (03/24/2018), 81.7 mg (04/28/2018), 81.7 mg (05/17/2018), 81.7 mg (05/31/2018), 81.7 mg (06/16/2018), 81.7 mg (06/30/2018), 81.7 mg (07/21/2018), 81.7 mg (08/04/2018), 81.7 mg (08/25/2018), 81.7 mg (09/08/2018)  for chemotherapy treatment.    09/22/2018 - 01/11/2019 Chemotherapy   The patient had palonosetron (ALOXI) injection 0.25 mg, 0.25 mg, Intravenous,  Once, 7 of 8 cycles Administration: 0.25 mg (09/22/2018), 0.25 mg (10/11/2018), 0.25 mg (10/25/2018), 0.25 mg (12/01/2018), 0.25 mg (12/15/2018), 0.25 mg (11/17/2018), 0.25 mg (12/29/2018) pegfilgrastim (  NEULASTA) injection 6 mg, 6 mg, Subcutaneous, Once, 5 of 5 cycles Administration: 6 mg (10/13/2018), 6 mg (10/27/2018), 6 mg (12/03/2018), 6 mg (12/17/2018), 6 mg  (11/19/2018) pegfilgrastim-cbqv (UDENYCA) injection 6 mg, 6 mg, Subcutaneous, Once, 1 of 2 cycles Administration: 6 mg (12/31/2018) leucovorin 628 mg in dextrose 5 % 250 mL infusion, 400 mg/m2 = 628 mg, Intravenous,  Once, 7 of 8 cycles Administration: 628 mg (09/22/2018), 628 mg (10/11/2018), 628 mg (10/25/2018), 628 mg (12/01/2018), 628 mg (12/15/2018), 628 mg (11/17/2018), 628 mg (12/29/2018) oxaliplatin (ELOXATIN) 100 mg in dextrose 5 % 500 mL chemo infusion, 65 mg/m2 = 100 mg (100 % of original dose 65 mg/m2), Intravenous,  Once, 7 of 8 cycles Dose modification: 65 mg/m2 (original dose 65 mg/m2, Cycle 1, Reason: Provider Judgment) Administration: 100 mg (09/22/2018), 100 mg (10/11/2018), 100 mg (10/25/2018), 100 mg (12/01/2018), 100 mg (12/15/2018), 100 mg (11/17/2018), 100 mg (12/29/2018) fluorouracil (ADRUCIL) chemo injection 500 mg, 320 mg/m2 = 500 mg (100 % of original dose 320 mg/m2), Intravenous,  Once, 7 of 8 cycles Dose modification: 320 mg/m2 (original dose 320 mg/m2, Cycle 1, Reason: Provider Judgment) Administration: 500 mg (09/22/2018), 500 mg (10/11/2018), 500 mg (10/25/2018), 500 mg (12/01/2018), 500 mg (12/15/2018), 500 mg (11/17/2018), 500 mg (12/29/2018) fluorouracil (ADRUCIL) 3,000 mg in sodium chloride 0.9 % 90 mL chemo infusion, 1,920 mg/m2 = 3,000 mg (100 % of original dose 1,920 mg/m2), Intravenous, 1 Day/Dose, 7 of 8 cycles Dose modification: 1,920 mg/m2 (original dose 1,920 mg/m2, Cycle 1, Reason: Provider Judgment) Administration: 3,000 mg (09/22/2018), 3,000 mg (10/11/2018), 3,000 mg (10/25/2018), 3,000 mg (12/01/2018), 3,000 mg (12/15/2018), 3,000 mg (11/17/2018), 3,000 mg (12/29/2018)  for chemotherapy treatment.    12/24/2018 Genetic Testing   Negative genetic testing on the common hereditary cancer panel.  The Common Hereditary Gene Panel offered by [facility] includes sequencing and/or deletion duplication testing of the following 48 genes: APC, ATM, AXIN2, BARD1, BMPR1A, BRCA1, BRCA2, BRIP1, CDH1, CDK4,  CDKN2A (p14ARF), CDKN2A (p16INK4a), CHEK2, CTNNA1, DICER1, EPCAM (Deletion/duplication testing only), GREM1 (promoter region deletion/duplication testing only), KIT, MEN1, MLH1, MSH2, MSH3, MSH6, MUTYH, NBN, NF1, NHTL1, PALB2, PDGFRA, PMS2, POLD1, POLE, PTEN, RAD50, RAD51C, RAD51D, RNF43, SDHB, SDHC, SDHD, SMAD4, SMARCA4. STK11, TP53, TSC1, TSC2, and VHL.  The following genes were evaluated for sequence changes only: SDHA and HOXB13 c.251G>A variant only. The report date is December 24, 2018.  DICER1 VUS identified.  This will not change medical decision making.   02/10/2019 -  Chemotherapy   The patient had palonosetron (ALOXI) injection 0.25 mg, 0.25 mg, Intravenous,  Once, 10 of 11 cycles Administration: 0.25 mg (02/10/2019), 0.25 mg (02/24/2019), 0.25 mg (03/10/2019), 0.25 mg (03/17/2019), 0.25 mg (03/31/2019), 0.25 mg (04/14/2019), 0.25 mg (05/12/2019), 0.25 mg (05/30/2019), 0.25 mg (06/14/2019), 0.25 mg (06/28/2019), 0.25 mg (07/13/2019), 0.25 mg (08/04/2019), 0.25 mg (08/18/2019), 0.25 mg (09/29/2019), 0.25 mg (10/27/2019) pegfilgrastim-jmdb (FULPHILA) injection 6 mg, 6 mg, Subcutaneous,  Once, 7 of 8 cycles Administration: 6 mg (02/25/2019), 6 mg (03/18/2019), 6 mg (05/31/2019), 6 mg (07/14/2019), 6 mg (08/05/2019), 6 mg (08/19/2019) pegfilgrastim-cbqv (UDENYCA) injection 6 mg, 6 mg, Subcutaneous, Once, 1 of 1 cycle Administration: 6 mg (06/29/2019) CISplatin (PLATINOL) 31 mg in sodium chloride 0.9 % 250 mL chemo infusion, 20 mg/m2 = 31 mg (80 % of original dose 25 mg/m2), Intravenous,  Once, 8 of 9 cycles Dose modification: 20 mg/m2 (80 % of original dose 25 mg/m2, Cycle 1, Reason: Other (see comments), Comment: renal dysfunction) Administration: 31 mg (02/10/2019), 31 mg (02/24/2019), 31 mg (  03/10/2019), 31 mg (03/17/2019), 31 mg (03/31/2019), 31 mg (04/14/2019), 31 mg (05/30/2019), 31 mg (06/14/2019), 31 mg (06/28/2019), 31 mg (07/13/2019), 31 mg (08/04/2019), 31 mg (08/18/2019) gemcitabine (GEMZAR) 1,140 mg in sodium chloride  0.9 % 250 mL chemo infusion, 750 mg/m2 = 1,140 mg (75 % of original dose 1,000 mg/m2), Intravenous,  Once, 10 of 11 cycles Dose modification: 750 mg/m2 (75 % of original dose 1,000 mg/m2, Cycle 1, Reason: Provider Judgment), 500 mg/m2 (original dose 1,000 mg/m2, Cycle 10, Reason: Provider Judgment) Administration: 1,140 mg (02/10/2019), 1,140 mg (02/24/2019), 1,140 mg (03/10/2019), 1,140 mg (03/17/2019), 1,140 mg (03/31/2019), 1,140 mg (04/14/2019), 1,140 mg (05/12/2019), 1,140 mg (05/30/2019), 1,140 mg (06/14/2019), 1,140 mg (06/28/2019), 1,140 mg (07/13/2019), 1,140 mg (08/04/2019), 1,140 mg (08/18/2019), 1,140 mg (09/29/2019), 760 mg (10/27/2019) fosaprepitant (EMEND) 150 mg, dexamethasone (DECADRON) 12 mg in sodium chloride 0.9 % 145 mL IVPB, , Intravenous,  Once, 10 of 11 cycles Administration:  (02/10/2019),  (02/24/2019),  (03/10/2019),  (03/17/2019),  (03/31/2019),  (04/14/2019),  (05/12/2019),  (05/30/2019),  (06/14/2019),  (06/28/2019),  (07/13/2019),  (08/04/2019),  (08/18/2019),  (10/27/2019)  for chemotherapy treatment.      CANCER STAGING: Cancer Staging Pancreatic cancer metastasized to liver [facility]) Staging form: Pancreas, AJCC 7th Edition - Clinical: Stage IV (T3, N1, M1) - Signed by Baird Cancer, PA-C on 03/16/2014 - Pathologic: No stage assigned - Unsigned    INTERVAL HISTORY:  Ms. Jillson 86 y.o. female returns for routine follow-up for pancreatic cancer.  Patient reports she has been on antibiotics this week due to a kidney infection.  She is still feeling very fatigued and tired.  She is not eating well. Denies any nausea, vomiting, or diarrhea. Denies any new pains. Had not noticed any recent bleeding such as epistaxis, hematuria or hematochezia. Denies recent chest pain on exertion, shortness of breath on minimal exertion, pre-syncopal episodes, or palpitations. Denies any numbness or tingling in hands or feet. Denies any recent fevers, infections, or recent hospitalizations. Patient reports  appetite at 25% and energy level at 50%.    REVIEW OF SYSTEMS:  Review of Systems  Constitutional: Positive for fatigue.  Gastrointestinal: Positive for constipation and diarrhea.  All other systems reviewed and are negative.    PAST MEDICAL/SURGICAL HISTORY:  Past Medical History:  Diagnosis Date  . Anemia due to antineoplastic chemotherapy 09/12/2015   Started Aranesp 500 mcg on 09/12/2015  . Anxiety   . Chronic diarrhea   . Depression   . DNR (do not resuscitate) 08/16/2015  . Erosive esophagitis   . Family history of breast cancer in female   . Family history of prostate cancer   . GERD (gastroesophageal reflux disease)   . Hx of adenomatous colonic polyps    tubular adenomas, last found in 2008  . Hyperplastic colon polyp 03/19/10   tcs by Dr. Gala Romney  . Hypertension 20 years   . Kidney stone    hx/ crushed   . Opioid contract exists 04/18/2015   With Dr. Moshe Cipro  . Pancreatic cancer ([place]) 02/2014  . Pancreatic cancer metastasized to liver ([facility]) 03/10/2014  . Schatzki's ring 08/26/10   Last dilated on EGD by Dr. Trevor Iha [facility], linear gastric erosions, BI hemigastrectomy   Past Surgical History:  Procedure Laterality Date  . BREAST LUMPECTOMY Left   . bunion removal     from both feet   . CHOLECYSTECTOMY  1965   . COLONOSCOPY  03/19/2010   DR Gala Romney,, normal TI, pancolonic diverticula, random colon bx neg., hyperplastic polyps  removed  . ESOPHAGOGASTRODUODENOSCOPY  11/22/2003   DR Gala Romney, erosive RE, Billroth I  . ESOPHAGOGASTRODUODENOSCOPY  08/26/10   Dr. Gala Romney- moderate severe ERE, Scahtzki ring s/p dilation, Billroth I, linear gastric erosions, bx-gastric xanthelasma  . LEFT SHOULDER SURGERY  2009   DR HARRISON  . ORIF ANKLE FRACTURE Right 05/22/2013   Procedure: OPEN REDUCTION INTERNAL FIXATION (ORIF) RIGHT ANKLE FRACTURE;  Surgeon: Sanjuana Kava, MD;  Location: [facility];  Service: Orthopedics;  Laterality: Right;  . stomach ulcer  50 years ago    had some of her stomach  removed      SOCIAL HISTORY:  Social History   Socioeconomic History  . Marital status: Widowed    Spouse name: Not on file  . Number of children: 2  . Years of education: 18  . Highest education level: 12th grade  Occupational History  . Occupation: retired from Estate manager/land agent: RETIRED  Tobacco Use  . Smoking status: Current Some Day Smoker    Packs/day: 0.50    Years: 60.00    Pack years: 30.00    Types: Cigarettes    Last attempt to quit: 09/09/2016    Years since quitting: 3.1  . Smokeless tobacco: Never Used  Vaping Use  . Vaping Use: Never used  Substance and Sexual Activity  . Alcohol use: No  . Drug use: No  . Sexual activity: Not Currently  Other Topics Concern  . Not on file  Social History Narrative   Lives with son alone    Social Determinants of Health   Financial Resource Strain:   . Difficulty of Paying Living Expenses:   Food Insecurity:   . Worried About Charity fundraiser in the Last Year:   . Arboriculturist in the Last Year:   Transportation Needs:   . Film/video editor (Medical):   Marland Kitchen Lack of Transportation (Non-Medical):   Physical Activity:   . Days of Exercise per Week:   . Minutes of Exercise per Session:   Stress:   . Feeling of Stress :   Social Connections:   . Frequency of Communication with Friends and Family:   . Frequency of Social Gatherings with Friends and Family:   . Attends Religious Services:   . Active Member of Clubs or Organizations:   . Attends Archivist Meetings:   Marland Kitchen Marital Status:   Intimate Partner Violence:   . Fear of Current or Ex-Partner:   . Emotionally Abused:   Marland Kitchen Physically Abused:   . Sexually Abused:     FAMILY HISTORY:  Family History  Problem Relation Age of Onset  . Hypertension Mother   . Kidney failure Brother        on dialysis  . Diabetes Sister   . Diabetes Sister   . Hypertension Father   . Kidney failure Sister   . Kidney Stones Brother   . Breast cancer Son  89  . Gout Son 27  . Gout Nephew 57  . Liver disease Neg Hx   . Colon cancer Neg Hx     CURRENT MEDICATIONS:  Outpatient Encounter Medications as of 11/10/2019  Medication Sig  . amLODipine (NORVASC) 10 MG tablet TAKE 1 TABLET DAILY  . CISPLATIN IV Inject into the vein. Days 1, 8 every 21 days  . clobetasol (TEMOVATE) 0.05 % external solution APP AA ON SCALP BID PRN NOT TO FACE GROIN OR UNDERARMS  . clonazePAM (KLONOPIN) 0.5 MG tablet Take 1 tablet (  0.5 mg total) by mouth at bedtime.  . cycloSPORINE (RESTASIS) 0.05 % ophthalmic emulsion Place 1 drop into both eyes 2 (two) times daily.  Marland Kitchen doxycycline (VIBRAMYCIN) 50 MG capsule Take 1 capsule (50 mg total) by mouth 2 (two) times daily for 5 days.  . ergocalciferol (VITAMIN D2) 1.25 MG (50000 UT) capsule Take 1 capsule (50,000 Units total) by mouth once a week.  . fluorometholone (FML) 0.1 % ophthalmic suspension INSTILL 1 DROP INTO EACH EYE THREE TIMES DAILY FOR 14 DAYS  . fluticasone (CUTIVATE) 0.05 % cream APPLY TO THE AFFECTED AREA FACE TWICE DAILY AS NEEDED  . fluticasone (FLONASE) 50 MCG/ACT nasal spray Place 1 spray into both nostrils daily.  Marland Kitchen GEMCITABINE HCL IV Inject into the vein. Days 1, 8 every 21 days  . lidocaine (XYLOCAINE) 2 % solution RINSE WITH 5ML AS NEEDED TO RELIEVE MOUTH PAIN.  . metoprolol tartrate (LOPRESSOR) 25 MG tablet TAKE 1 TABLET DAILY FOR BLOOD PRESSURE (DOSE REDUCTION)  . mirtazapine (REMERON) 15 MG tablet Take 15 mg by mouth at bedtime.  Marland Kitchen MYRBETRIQ 50 MG TB24 tablet Take 50 mg by mouth daily.  Marland Kitchen oxybutynin (DITROPAN-XL) 10 MG 24 hr tablet TAKE 1 TABLET(5 MG) BY MOUTH AT BEDTIME  . pantoprazole (PROTONIX) 20 MG tablet TAKE 1 TABLET DAILY  . senna-docusate (SENOKOT-S) 8.6-50 MG tablet Take 1 tablet by mouth daily.  . traMADol (ULTRAM) 50 MG tablet Take 1 tablet (50 mg total) by mouth 3 (three) times daily.  Marland Kitchen acetaminophen (TYLENOL) 500 MG tablet Take 500 mg by mouth every 6 (six) hours as needed for mild pain  or moderate pain.  (Patient not taking: Reported on 11/10/2019)  . lidocaine-prilocaine (EMLA) cream Apply 1 application topically as needed. Apply to portacath site as needed (Patient not taking: Reported on 11/10/2019)  . loperamide (IMODIUM A-D) 2 MG tablet Take 2 mg by mouth 4 (four) times daily as needed for diarrhea or loose stools.  (Patient not taking: Reported on 11/10/2019)  . [DISCONTINUED] prochlorperazine (COMPAZINE) 10 MG tablet Take 1 tablet (10 mg total) by mouth every 6 (six) hours as needed (Nausea or vomiting).   No facility-administered encounter medications on file as of 11/10/2019.    ALLERGIES:  Allergies  Allergen Reactions  . Iohexol Hives and Shortness Of Breath    **Patient can not have IV contrast, pt has breakthrough reaction even after 13 hour premeds** Do not give IV contrast PATIENT HAD TO BE TAKEN TO ED, C/O WHELPS, HIVES, DIFFICULTY BREATHING. PATIENT GIVEN IV CONTRAST AFTER 13 HOUR PRE MEDS ON 01/09/2017 WITH NO REACTION NOTED. On 10/19/17 patient had reaction despite premedication.    Marland Kitchen Aciphex [Rabeprazole Sodium] Other (See Comments)    unknown  . Amlodipine Besylate-Valsartan     Rash to high-dose (5/320)  . Esomeprazole Magnesium   . Omeprazole Other (See Comments)    Patient states "medication didn't work"  . Ciprofloxacin Rash  . Penicillins Swelling and Rash    Has patient had a PCN reaction causing immediate rash, facial/tongue/throat swelling, SOB or lightheadedness with hypotension: Yes Has patient had a PCN reaction causing severe rash involving mucus membranes or skin necrosis: Yes Has patient had a PCN reaction that required hospitalization: No Has patient had a PCN reaction occurring within the last 10 years: yes If all of the above answers are "NO", then may proceed with Cephalosporin use.   [name] [Benzonatate] Itching    Full head, face, neck, and chest itching.  PHYSICAL EXAM:  ECOG Performance status: 1  Vitals:    11/10/19 0815  BP: (!) 118/57  Pulse: (!) 57  Resp: 17  Temp: (!) 97.5 F (36.4 C)  SpO2: 100%   Filed Weights   11/10/19 0815  Weight: 96 lb 6.4 oz (43.7 kg)   Physical Exam Constitutional:      Appearance: Normal appearance. She is normal weight.  Cardiovascular:     Rate and Rhythm: Normal rate and regular rhythm.     Heart sounds: Normal heart sounds.  Pulmonary:     Effort: Pulmonary effort is normal.     Breath sounds: Normal breath sounds.  Abdominal:     General: Bowel sounds are normal.     Palpations: Abdomen is soft.  Musculoskeletal:        General: Normal range of motion.  Skin:    General: Skin is warm.  Neurological:     Mental Status: She is alert and oriented to person, place, and time. Mental status is at baseline.  Psychiatric:        Mood and Affect: Mood normal.        Behavior: Behavior normal.        Thought Content: Thought content normal.        Judgment: Judgment normal.      LABORATORY DATA:  I have reviewed the labs as listed.  CBC    Component Value Date/Time   WBC 4.0 11/10/2019 0837   RBC 2.66 (L) 11/10/2019 0837   HGB 8.4 (L) 11/10/2019 0837   HCT 25.4 (L) 11/10/2019 0837   PLT 106 (L) 11/10/2019 0837   MCV 95.5 11/10/2019 0837   MCH 31.6 11/10/2019 0837   MCHC 33.1 11/10/2019 0837   RDW 15.0 11/10/2019 0837   LYMPHSABS 0.8 11/10/2019 0837   MONOABS 0.3 11/10/2019 0837   EOSABS 0.1 11/10/2019 0837   BASOSABS 0.0 11/10/2019 0837   CMP Latest Ref Rng & Units 11/10/2019 10/27/2019 10/13/2019  Glucose 70 - 99 mg/dL 98 115(H) 102(H)  BUN 8 - 23 mg/dL 21 31(H) 27(H)  Creatinine 0.44 - 1.00 mg/dL 1.80(H) 1.69(H) 1.69(H)  Sodium 135 - 145 mmol/L 134(L) 138 138  Potassium 3.5 - 5.1 mmol/L 3.5 3.5 3.7  Chloride 98 - 111 mmol/L 93(L) 103 104  CO2 22 - 32 mmol/L '30 24 24  ' Calcium 8.9 - 10.3 mg/dL 8.1(L) 8.3(L) 8.3(L)  Total Protein 6.5 - 8.1 g/dL 6.7 7.6 7.2  Total Bilirubin 0.3 - 1.2 mg/dL 1.0 0.7 0.8  Alkaline Phos 38 - 126 U/L  188(H) 293(H) 289(H)  AST 15 - 41 U/L 24 87(H) 37  ALT 0 - 44 U/L 21 48(H) 34   All questions were answered to patient's stated satisfaction. Encouraged patient to call with any new concerns or questions before his next visit to the cancer center and we can certain see him sooner, if needed.     ASSESSMENT & PLAN:  Pancreatic cancer metastasized to liver ([place]) 1. Metastatic pancreatic cancer to the liver: -Gemcitabine and Abraxane from November 2015 through August 2018 with progression. -Gemcitabine and Tarceva from 01/28/2017 through 10/07/2017 with progression. -21 cycles of infusional 5-FU and Onivyde from 10/21/2017 through 09/08/2018. -7 cycles of FOLFOX from 09/22/2018 through 01/29/2019. -Gemcitabine and cisplatin day 1 and day 15 started on 02/10/2019. -CT CAP on 09/16/2019 showed mass in the posterior liver dome measuring 4.6 x 3.6 cm compared to 4 x 3.2 cm on 06/01/2019. Left lobe mass also improved with 3 mm. No  new hepatic lesions. Study limited by lack of IV contrast. No new areas of metastatic disease. -She has shown great response to treatments in the past years, we are continuing recycle palliative chemotherapy. If she continues to decline we will consider palliative care with hospice. -Her last treatment her cisplatin was held and her gemcitabine was dose reduced. -Labs today show her creatinine is 1.80.  Hemoglobin 8.4. -She reports she is still on antibiotics from a bad kidney infection she had earlier this week. -We will hold her treatment today and give her a liter of IV fluids. -We will see her back in 2 weeks to resume treatment.  2. Hypomagnesemia: -Labs done 11/10/2019 on showed magnesium 1.7  3. Left lateral chest wall pain: -She has pain under her bilateral ribs on the lateral side. -She will take tramadol 50 mg 3 times a day.  4. Overactive bladder: -Continue oxybutynin 10 mg daily.  5. Weight loss: -She is lost another 3 pounds since her last visit. -Continue  Remeron 15 mg at bedtime  6. Vitamin D deficiency: -Continue vitamin D supplements.     Orders placed this encounter:  Orders Placed This Encounter  Procedures  . Lactate dehydrogenase  . Ferritin  . Iron and TIBC  . Vitamin B12  . VITAMIN D 25 Hydroxy (Vit-D Deficiency, Fractures)  . Magnesium  . CBC with Differential/Platelet  . Comprehensive metabolic panel      [name] Finders, FNP-C [place] [phone]

## 2019-11-10 NOTE — Progress Notes (Signed)
Treatment held today.  1 liter of fluids given.  Tolerated well. Discharged ambulatory.

## 2019-11-11 ENCOUNTER — Inpatient Hospital Stay (HOSPITAL_COMMUNITY): Payer: Medicare HMO

## 2019-11-11 LAB — URINE CULTURE: Culture: >=100000 — AB

## 2019-11-11 LAB — CANCER ANTIGEN 19-9: CA 19-9: 1198 U/mL — ABNORMAL HIGH (ref 0–35)

## 2019-11-22 ENCOUNTER — Encounter: Payer: Self-pay | Admitting: Family Medicine

## 2019-11-22 ENCOUNTER — Ambulatory Visit (INDEPENDENT_AMBULATORY_CARE_PROVIDER_SITE_OTHER): Payer: Medicare HMO | Admitting: Family Medicine

## 2019-11-22 ENCOUNTER — Other Ambulatory Visit: Payer: Self-pay

## 2019-11-22 VITALS — BP 130/80 | HR 59 | Resp 16 | Ht 63.0 in | Wt 93.0 lb

## 2019-11-22 DIAGNOSIS — Z0000 Encounter for general adult medical examination without abnormal findings: Secondary | ICD-10-CM | POA: Diagnosis not present

## 2019-11-22 NOTE — Patient Instructions (Addendum)
F/U with MD in 4 months, call if you need me before  Happy Birthday in 10 days, blessings for many more  Careful not to fall  No medication changes   Thanks for choosing [facility], we consider it a privelige to serve you.

## 2019-11-24 ENCOUNTER — Inpatient Hospital Stay (HOSPITAL_COMMUNITY): Payer: Medicare HMO

## 2019-11-24 ENCOUNTER — Inpatient Hospital Stay (HOSPITAL_BASED_OUTPATIENT_CLINIC_OR_DEPARTMENT_OTHER): Payer: Medicare HMO | Admitting: Hematology

## 2019-11-24 ENCOUNTER — Encounter (HOSPITAL_COMMUNITY): Payer: Self-pay | Admitting: Hematology

## 2019-11-24 ENCOUNTER — Other Ambulatory Visit: Payer: Self-pay

## 2019-11-24 VITALS — BP 159/61 | HR 60 | Temp 97.0°F | Resp 16 | Wt 92.3 lb

## 2019-11-24 VITALS — BP 166/76 | HR 52 | Temp 97.1°F | Resp 17

## 2019-11-24 DIAGNOSIS — C259 Malignant neoplasm of pancreas, unspecified: Secondary | ICD-10-CM | POA: Diagnosis not present

## 2019-11-24 DIAGNOSIS — C787 Secondary malignant neoplasm of liver and intrahepatic bile duct: Secondary | ICD-10-CM | POA: Diagnosis not present

## 2019-11-24 DIAGNOSIS — T451X5A Adverse effect of antineoplastic and immunosuppressive drugs, initial encounter: Secondary | ICD-10-CM

## 2019-11-24 DIAGNOSIS — D6481 Anemia due to antineoplastic chemotherapy: Secondary | ICD-10-CM

## 2019-11-24 LAB — COMPREHENSIVE METABOLIC PANEL
ALT: 91 U/L — ABNORMAL HIGH (ref 0–44)
AST: 132 U/L — ABNORMAL HIGH (ref 15–41)
Albumin: 3.2 g/dL — ABNORMAL LOW (ref 3.5–5.0)
Alkaline Phosphatase: 221 U/L — ABNORMAL HIGH (ref 38–126)
Anion gap: 10 (ref 5–15)
BUN: 19 mg/dL (ref 8–23)
CO2: 27 mmol/L (ref 22–32)
Calcium: 8.3 mg/dL — ABNORMAL LOW (ref 8.9–10.3)
Chloride: 98 mmol/L (ref 98–111)
Creatinine, Ser: 1.21 mg/dL — ABNORMAL HIGH (ref 0.44–1.00)
GFR calc Af Amer: 46 mL/min — ABNORMAL LOW (ref >60)
GFR calc non Af Amer: 40 mL/min — ABNORMAL LOW (ref >60)
Glucose, Bld: 107 mg/dL — ABNORMAL HIGH (ref 70–99)
Potassium: 3 mmol/L — ABNORMAL LOW (ref 3.5–5.1)
Sodium: 135 mmol/L (ref 135–145)
Total Bilirubin: 2.5 mg/dL — ABNORMAL HIGH (ref 0.3–1.2)
Total Protein: 7.2 g/dL (ref 6.5–8.1)

## 2019-11-24 LAB — CBC WITH DIFFERENTIAL/PLATELET
Abs Immature Granulocytes: 0 10*3/uL (ref 0.00–0.07)
Basophils Absolute: 0 10*3/uL (ref 0.0–0.1)
Basophils Relative: 0 %
Eosinophils Absolute: 0 10*3/uL (ref 0.0–0.5)
Eosinophils Relative: 1 %
HCT: 29.3 % — ABNORMAL LOW (ref 36.0–46.0)
Hemoglobin: 9.9 g/dL — ABNORMAL LOW (ref 12.0–15.0)
Immature Granulocytes: 0 %
Lymphocytes Relative: 16 %
Lymphs Abs: 1.2 10*3/uL (ref 0.7–4.0)
MCH: 31.7 pg (ref 26.0–34.0)
MCHC: 33.8 g/dL (ref 30.0–36.0)
MCV: 93.9 fL (ref 80.0–100.0)
Monocytes Absolute: 0.6 10*3/uL (ref 0.1–1.0)
Monocytes Relative: 8 %
Neutro Abs: 5.6 10*3/uL (ref 1.7–7.7)
Neutrophils Relative %: 75 %
Platelets: 155 10*3/uL (ref 150–400)
RBC: 3.12 MIL/uL — ABNORMAL LOW (ref 3.87–5.11)
RDW: 15.5 % (ref 11.5–15.5)
WBC: 7.5 10*3/uL (ref 4.0–10.5)
nRBC: 0 % (ref 0.0–0.2)

## 2019-11-24 LAB — IRON AND TIBC
Iron: 46 ug/dL (ref 28–170)
Saturation Ratios: 29 % (ref 10.4–31.8)
TIBC: 160 ug/dL — ABNORMAL LOW (ref 250–450)
UIBC: 114 ug/dL

## 2019-11-24 LAB — VITAMIN B12: Vitamin B-12: 911 pg/mL (ref 180–914)

## 2019-11-24 LAB — MAGNESIUM: Magnesium: 1.7 mg/dL (ref 1.7–2.4)

## 2019-11-24 LAB — VITAMIN D 25 HYDROXY (VIT D DEFICIENCY, FRACTURES): Vit D, 25-Hydroxy: 101.96 ng/mL — ABNORMAL HIGH (ref 30–100)

## 2019-11-24 LAB — LACTATE DEHYDROGENASE: LDH: 154 U/L (ref 98–192)

## 2019-11-24 LAB — FERRITIN: Ferritin: 452 ng/mL — ABNORMAL HIGH (ref 11–307)

## 2019-11-24 MED ORDER — HEPARIN SOD (PORK) LOCK FLUSH 100 UNIT/ML IV SOLN
500.0000 [IU] | Freq: Once | INTRAVENOUS | Status: AC | PRN
  Administered 2019-11-24: 500 [IU]

## 2019-11-24 MED ORDER — SODIUM CHLORIDE 0.9 % IV SOLN
Freq: Once | INTRAVENOUS | Status: AC
  Filled 2019-11-24: qty 1000

## 2019-11-24 MED ORDER — SODIUM CHLORIDE 0.9% FLUSH
10.0000 mL | Freq: Once | INTRAVENOUS | Status: AC | PRN
  Administered 2019-11-24: 10 mL

## 2019-11-24 MED ORDER — NITROFURANTOIN MONOHYD MACRO 100 MG PO CAPS
100.0000 mg | ORAL_CAPSULE | Freq: Two times a day (BID) | ORAL | 0 refills | Status: DC
Start: 2019-11-24 — End: 2019-12-08

## 2019-11-24 NOTE — Progress Notes (Signed)
Patient has been assessed, vital signs and labs have been reviewed by Dr. Delton Coombes. Please give 1 liter of fluids with electrolytes over 2 hours today, HOLD treatment today per Dr. Delton Coombes due to UTI and lab results.

## 2019-11-24 NOTE — Progress Notes (Signed)
[name], [place] [address]   CLINIC:  Medical Oncology/Hematology  PCP:  [name] Helper, MD [address] [place] / [place] [address] [phone]   REASON FOR VISIT:  Follow-up for metastatic pancreatic cancer to the liver  PRIOR THERAPY: 1. Gemcitabine and Abraxane from 03/2014 to 12/2016 2. Gemcitabine and Tarceva from 01/28/2017 to 10/07/2017 3. Infusional 5-FU and Onivyde x 21 cycles from 10/21/2017 to 09/08/2018 4. FOLFOX x 7 cycles from 09/22/2018 to 12/29/2018  NGS Results: Foundation 1 MS--stable, TMB cannot be determined, K-ras G 12 V, T p53 mutation  CURRENT THERAPY: cisplatin, gemcitabine and Aloxi  BRIEF ONCOLOGIC HISTORY:  Oncology History  Pancreatic cancer metastasized to liver ([place])  03/10/2014 Initial Diagnosis   Pancreatic cancer metastasized to liver   03/22/2014 - 03/05/2016 Chemotherapy   Abraxane/Gemzar days 1, 8, every 28 days.  Day 15 was cancelled due to leukopenia and thrombocytopenia on day 15 cycle 1.   05/24/2014 Treatment Plan Change   Day 8 of cycle 3 is held with ANC of 1.1   06/05/2014 Imaging   CT C/A/P . Interval decrease in size of the pancreatic tail mass.Improved hepatic metastatic disease. No new lesions. No CT findings for metastatic disease involving the chest.   08/09/2014 Tumor Marker   CA 19-9= 33 (WNL)   10/26/2014 Imaging   MRI- Continued interval decrease in size of the hepatic metastatic lesions and no new lesions are identified. Continued decrease in size of the pancreatic tail lesion.   01/03/2015 Tumor Marker   Results for [name] (MRN [id number]) as of 01/18/2015 12:52  01/03/2015 10:00 CA 19-9: 14    01/17/2015 Imaging   MRI- Response to therapy of hepatic metastasis.  Similar size of a pancreatic tail lesion.   03/20/2015 Imaging   MRI-L spine- Severe disc space narrowing at L2-L3, with endplate reactive changes. Large disc extrusion into the ventral epidural  space,central to the RIGHT with a cephalad migrated free fragment. Additional Large disc extrusion into the retroperitone...   03/22/2015 Imaging   CT pelvis- No evidence for metastatic disease within the pelvis.   07/24/2015 Imaging   MRI abd- Continued response to therapy, with no residual detectable liver metastases. No new sites of metastatic disease in the abdomen.    08/15/2015 Code Status    She confirms desire for DNR status.   12/21/2015 Imaging   MRI liver- Severe image degradation due to motion artifact reducing diagnostic sensitivity and specificity. Reduce conspicuity of the pancreatic tail lesion suggesting further improvement. The original liver lesions have resolved.   03/05/2016 Treatment Plan Change   Chemotherapy holiday/Break after 2 years worth of treatment   04/07/2016 Imaging   CT chest- When compared to recent chest CT, new minimally displaced anterior left sixth rib fracture. Slight increase in subcarinal adenopathy   04/28/2016 Imaging   Bone density- BMD as determined from Femur Neck Right is 0.705 g/cm2 with a T-Score of -2.4. This patient is considered osteopenic according to [facility]) criteria. Compared with the prior study on 02/23/2013, the BMD of the lumbar spine/rt. femoral neck show a statistically significant decrease.    05/23/2016 Imaging   MRI abd- 1. Exam is significantly degraded by patient respiratory motion. Consider follow-up exams with CT abdomen with without contrast per pancreatic protocol. 2. Fullness in tail of pancreas is more prominent and potentially increased in size. Recommend close attention on follow-up. (Consider CT as above) 3. No explanation  for back pain.   09/11/2016 Imaging   MRI pancreas- Interval progression of pancreatic tail lesion. New differential perfusion right liver with areas of heterogeneity. Underlying metastatic disease in this region not excluded.   09/11/2016 Progression   MRi in  conjunction with rising CA 19-9 are indicative of relapse of disease.   01/09/2017 Progression   CT C/A/P: 2.1 x 2.9 cm lesion in the pancreatic tail and abutting the splenic hilum, corresponding to known primary pancreatic neoplasm, mildly increased.  Scattered small hypoenhancing lesions in the liver measuring up to 10 mm, approximately 8-10 in number, suspicious for hepatic metastases.  No findings specific for metastatic disease in the chest.  Additional ancillary findings as above.    01/27/2017 - 10/20/2017 Chemotherapy   The patient had gemcitabine (GEMZAR) 1,292 mg in sodium chloride 0.9 % 250 mL chemo infusion, 800 mg/m2 = 1,292 mg, Intravenous,  Once, 9 of 12 cycles Administration: 1,292 mg (01/28/2017), 1,292 mg (02/11/2017), 1,292 mg (02/25/2017), 1,292 mg (03/11/2017), 1,292 mg (03/25/2017), 1,292 mg (04/08/2017), 1,292 mg (04/22/2017), 1,292 mg (04/29/2017), 1,292 mg (05/20/2017), 1,292 mg (06/03/2017), 1,292 mg (06/24/2017), 1,292 mg (07/08/2017), 1,292 mg (07/29/2017), 1,292 mg (08/12/2017), 1,292 mg (08/26/2017), 1,292 mg (09/09/2017), 1,292 mg (09/23/2017), 1,292 mg (10/07/2017)  for chemotherapy treatment.    10/21/2017 - 09/22/2018 Chemotherapy   The patient had leucovorin 700 mg in dextrose 5 % 250 mL infusion, 644 mg, Intravenous,  Once, 22 of 22 cycles Administration: 700 mg (10/21/2017), 700 mg (11/04/2017), 700 mg (11/18/2017), 700 mg (12/02/2017), 700 mg (12/16/2017), 700 mg (12/30/2017), 700 mg (01/13/2018), 700 mg (01/27/2018), 700 mg (02/10/2018), 700 mg (02/24/2018), 700 mg (03/10/2018), 700 mg (03/24/2018), 700 mg (04/28/2018), 700 mg (05/17/2018), 700 mg (05/31/2018), 700 mg (06/16/2018), 700 mg (06/30/2018), 700 mg (07/21/2018), 700 mg (08/04/2018), 700 mg (08/25/2018), 700 mg (09/08/2018) ondansetron (ZOFRAN) 8 mg in sodium chloride 0.9 % 50 mL IVPB, , Intravenous,  Once, 21 of 21 cycles Administration:  (11/04/2017),  (01/27/2018),  (02/10/2018),  (02/24/2018),  (03/10/2018),  (03/24/2018),   (04/28/2018),  (05/17/2018),  (05/31/2018),  (06/16/2018),  (06/30/2018),  (07/21/2018),  (08/04/2018),  (08/25/2018),  (09/08/2018) fluorouracil (ADRUCIL) 3,850 mg in sodium chloride 0.9 % 73 mL chemo infusion, 2,400 mg/m2 = 3,850 mg, Intravenous, 1 Day/Dose, 22 of 22 cycles Dose modification: 1,920 mg/m2 (original dose 2,400 mg/m2, Cycle 2, Reason: Provider Judgment, Comment: mucisitis) Administration: 3,850 mg (10/21/2017), 3,100 mg (11/04/2017), 3,100 mg (11/18/2017), 3,100 mg (12/02/2017), 3,100 mg (12/16/2017), 3,100 mg (12/30/2017), 3,100 mg (01/13/2018), 3,100 mg (01/27/2018), 3,100 mg (02/10/2018), 3,100 mg (02/24/2018), 3,100 mg (03/10/2018), 3,100 mg (03/24/2018), 3,100 mg (04/28/2018), 3,100 mg (05/17/2018), 3,100 mg (05/31/2018), 3,100 mg (06/16/2018), 3,100 mg (06/30/2018), 3,100 mg (07/21/2018), 3,100 mg (08/04/2018), 3,100 mg (08/25/2018), 3,100 mg (09/08/2018) irinotecan LIPOSOME (ONIVYDE) 81.7 mg in sodium chloride 0.9 % 500 mL chemo infusion, 50 mg/m2 = 81.7 mg (100 % of original dose 50 mg/m2), Intravenous, Once, 22 of 22 cycles Dose modification: 50 mg/m2 (original dose 50 mg/m2, Cycle 1, Reason: Provider Judgment), 50 mg/m2 (original dose 50 mg/m2, Cycle 2, Reason: Provider Judgment) Administration: 81.7 mg (10/21/2017), 81.7 mg (11/04/2017), 81.7 mg (11/18/2017), 81.7 mg (12/02/2017), 81.7 mg (12/16/2017), 81.7 mg (12/30/2017), 81.7 mg (01/13/2018), 81.7 mg (01/27/2018), 81.7 mg (02/10/2018), 81.7 mg (02/24/2018), 81.7 mg (03/10/2018), 81.7 mg (03/24/2018), 81.7 mg (04/28/2018), 81.7 mg (05/17/2018), 81.7 mg (05/31/2018), 81.7 mg (06/16/2018), 81.7 mg (06/30/2018), 81.7 mg (07/21/2018), 81.7 mg (08/04/2018), 81.7 mg (08/25/2018), 81.7 mg (09/08/2018)  for chemotherapy treatment.    09/22/2018 - 01/11/2019 Chemotherapy  The patient had palonosetron (ALOXI) injection 0.25 mg, 0.25 mg, Intravenous,  Once, 7 of 8 cycles Administration: 0.25 mg (09/22/2018), 0.25 mg (10/11/2018), 0.25 mg (10/25/2018), 0.25 mg (12/01/2018), 0.25 mg (12/15/2018),  0.25 mg (11/17/2018), 0.25 mg (12/29/2018) pegfilgrastim (NEULASTA) injection 6 mg, 6 mg, Subcutaneous, Once, 5 of 5 cycles Administration: 6 mg (10/13/2018), 6 mg (10/27/2018), 6 mg (12/03/2018), 6 mg (12/17/2018), 6 mg (11/19/2018) pegfilgrastim-cbqv (UDENYCA) injection 6 mg, 6 mg, Subcutaneous, Once, 1 of 2 cycles Administration: 6 mg (12/31/2018) leucovorin 628 mg in dextrose 5 % 250 mL infusion, 400 mg/m2 = 628 mg, Intravenous,  Once, 7 of 8 cycles Administration: 628 mg (09/22/2018), 628 mg (10/11/2018), 628 mg (10/25/2018), 628 mg (12/01/2018), 628 mg (12/15/2018), 628 mg (11/17/2018), 628 mg (12/29/2018) oxaliplatin (ELOXATIN) 100 mg in dextrose 5 % 500 mL chemo infusion, 65 mg/m2 = 100 mg (100 % of original dose 65 mg/m2), Intravenous,  Once, 7 of 8 cycles Dose modification: 65 mg/m2 (original dose 65 mg/m2, Cycle 1, Reason: Provider Judgment) Administration: 100 mg (09/22/2018), 100 mg (10/11/2018), 100 mg (10/25/2018), 100 mg (12/01/2018), 100 mg (12/15/2018), 100 mg (11/17/2018), 100 mg (12/29/2018) fluorouracil (ADRUCIL) chemo injection 500 mg, 320 mg/m2 = 500 mg (100 % of original dose 320 mg/m2), Intravenous,  Once, 7 of 8 cycles Dose modification: 320 mg/m2 (original dose 320 mg/m2, Cycle 1, Reason: Provider Judgment) Administration: 500 mg (09/22/2018), 500 mg (10/11/2018), 500 mg (10/25/2018), 500 mg (12/01/2018), 500 mg (12/15/2018), 500 mg (11/17/2018), 500 mg (12/29/2018) fluorouracil (ADRUCIL) 3,000 mg in sodium chloride 0.9 % 90 mL chemo infusion, 1,920 mg/m2 = 3,000 mg (100 % of original dose 1,920 mg/m2), Intravenous, 1 Day/Dose, 7 of 8 cycles Dose modification: 1,920 mg/m2 (original dose 1,920 mg/m2, Cycle 1, Reason: Provider Judgment) Administration: 3,000 mg (09/22/2018), 3,000 mg (10/11/2018), 3,000 mg (10/25/2018), 3,000 mg (12/01/2018), 3,000 mg (12/15/2018), 3,000 mg (11/17/2018), 3,000 mg (12/29/2018)  for chemotherapy treatment.    12/24/2018 Genetic Testing   Negative genetic testing on the common hereditary  cancer panel.  The Common Hereditary Gene Panel offered by [facility] includes sequencing and/or deletion duplication testing of the following 48 genes: APC, ATM, AXIN2, BARD1, BMPR1A, BRCA1, BRCA2, BRIP1, CDH1, CDK4, CDKN2A (p14ARF), CDKN2A (p16INK4a), CHEK2, CTNNA1, DICER1, EPCAM (Deletion/duplication testing only), GREM1 (promoter region deletion/duplication testing only), KIT, MEN1, MLH1, MSH2, MSH3, MSH6, MUTYH, NBN, NF1, NHTL1, PALB2, PDGFRA, PMS2, POLD1, POLE, PTEN, RAD50, RAD51C, RAD51D, RNF43, SDHB, SDHC, SDHD, SMAD4, SMARCA4. STK11, TP53, TSC1, TSC2, and VHL.  The following genes were evaluated for sequence changes only: SDHA and HOXB13 c.251G>A variant only. The report date is December 24, 2018.  DICER1 VUS identified.  This will not change medical decision making.   02/10/2019 -  Chemotherapy   The patient had palonosetron (ALOXI) injection 0.25 mg, 0.25 mg, Intravenous,  Once, 10 of 11 cycles Administration: 0.25 mg (02/10/2019), 0.25 mg (02/24/2019), 0.25 mg (03/10/2019), 0.25 mg (03/17/2019), 0.25 mg (03/31/2019), 0.25 mg (04/14/2019), 0.25 mg (05/12/2019), 0.25 mg (05/30/2019), 0.25 mg (06/14/2019), 0.25 mg (06/28/2019), 0.25 mg (07/13/2019), 0.25 mg (08/04/2019), 0.25 mg (08/18/2019), 0.25 mg (09/29/2019), 0.25 mg (10/27/2019) pegfilgrastim-jmdb (FULPHILA) injection 6 mg, 6 mg, Subcutaneous,  Once, 7 of 8 cycles Administration: 6 mg (02/25/2019), 6 mg (03/18/2019), 6 mg (05/31/2019), 6 mg (07/14/2019), 6 mg (08/05/2019), 6 mg (08/19/2019) pegfilgrastim-cbqv (UDENYCA) injection 6 mg, 6 mg, Subcutaneous, Once, 1 of 1 cycle Administration: 6 mg (06/29/2019) CISplatin (PLATINOL) 31 mg in sodium chloride 0.9 % 250 mL chemo infusion, 20 mg/m2 = 31 mg (80 % of  original dose 25 mg/m2), Intravenous,  Once, 8 of 9 cycles Dose modification: 20 mg/m2 (80 % of original dose 25 mg/m2, Cycle 1, Reason: Other (see comments), Comment: renal dysfunction) Administration: 31 mg (02/10/2019), 31 mg (02/24/2019), 31 mg (03/10/2019), 31  mg (03/17/2019), 31 mg (03/31/2019), 31 mg (04/14/2019), 31 mg (05/30/2019), 31 mg (06/14/2019), 31 mg (06/28/2019), 31 mg (07/13/2019), 31 mg (08/04/2019), 31 mg (08/18/2019) gemcitabine (GEMZAR) 1,140 mg in sodium chloride 0.9 % 250 mL chemo infusion, 750 mg/m2 = 1,140 mg (75 % of original dose 1,000 mg/m2), Intravenous,  Once, 10 of 11 cycles Dose modification: 750 mg/m2 (75 % of original dose 1,000 mg/m2, Cycle 1, Reason: Provider Judgment), 500 mg/m2 (original dose 1,000 mg/m2, Cycle 10, Reason: Provider Judgment) Administration: 1,140 mg (02/10/2019), 1,140 mg (02/24/2019), 1,140 mg (03/10/2019), 1,140 mg (03/17/2019), 1,140 mg (03/31/2019), 1,140 mg (04/14/2019), 1,140 mg (05/12/2019), 1,140 mg (05/30/2019), 1,140 mg (06/14/2019), 1,140 mg (06/28/2019), 1,140 mg (07/13/2019), 1,140 mg (08/04/2019), 1,140 mg (08/18/2019), 1,140 mg (09/29/2019), 760 mg (10/27/2019) fosaprepitant (EMEND) 150 mg, dexamethasone (DECADRON) 12 mg in sodium chloride 0.9 % 145 mL IVPB, , Intravenous,  Once, 10 of 11 cycles Administration:  (02/10/2019),  (02/24/2019),  (03/10/2019),  (03/17/2019),  (03/31/2019),  (04/14/2019),  (05/12/2019),  (05/30/2019),  (06/14/2019),  (06/28/2019),  (07/13/2019),  (08/04/2019),  (08/18/2019),  (10/27/2019)  for chemotherapy treatment.      CANCER STAGING: Cancer Staging Pancreatic cancer metastasized to liver [facility]) Staging form: Pancreas, AJCC 7th Edition - Clinical: Stage IV (T3, N1, M1) - Signed by Baird Cancer, PA-C on 03/16/2014 - Pathologic: No stage assigned - Unsigned   INTERVAL HISTORY:  [name], a 86 y.o. female, returns for routine follow-up and consideration for next cycle of chemotherapy. [name] was last seen on 10/27/2019.  Due for cycle #11 of gemcitabine, cisplatin and Aloxi today.   Today she is accompanied by her son. She has been feeling well, but today reports feeling fatigued. She had a UTI and finished a course of doxycycline 1 tablet BID for 5 days, but still reports  urinary frequency and some dysuria. She reports having a sore stomach, though she takes tramadol BID-TID, and her appetite has returned over the last several days and she denies N/V/D. She drinks about 2-3 16 ounce bottles per day. She reports being sedentary most of the day due to her weakness.   REVIEW OF SYSTEMS:  Review of Systems  Constitutional: Positive for appetite change and fatigue (severe).  Gastrointestinal: Positive for abdominal pain (5/10 abdominal soreness). Negative for diarrhea, nausea and vomiting.  Genitourinary: Positive for dysuria and frequency.   All other systems reviewed and are negative.   PAST MEDICAL/SURGICAL HISTORY:  Past Medical History:  Diagnosis Date  . Anemia due to antineoplastic chemotherapy 09/12/2015   Started Aranesp 500 mcg on 09/12/2015  . Anxiety   . Chronic diarrhea   . Depression   . DNR (do not resuscitate) 08/16/2015  . Erosive esophagitis   . Family history of breast cancer in female   . Family history of prostate cancer   . GERD (gastroesophageal reflux disease)   . Hx of adenomatous colonic polyps    tubular adenomas, last found in 2008  . Hyperplastic colon polyp 03/19/10   tcs by Dr. Gala Romney  . Hypertension 20 years   . Kidney stone    hx/ crushed   . Opioid contract exists 04/18/2015   With Dr. Moshe Cipro  . Pancreatic cancer ([place]) 02/2014  . Pancreatic cancer metastasized to liver (  [place]) 03/10/2014  . Schatzki's ring 08/26/10   Last dilated on EGD by Dr. Trevor Iha [facility], linear gastric erosions, BI hemigastrectomy   Past Surgical History:  Procedure Laterality Date  . BREAST LUMPECTOMY Left   . bunion removal     from both feet   . CHOLECYSTECTOMY  1965   . COLONOSCOPY  03/19/2010   DR Gala Romney,, normal TI, pancolonic diverticula, random colon bx neg., hyperplastic polyps removed  . ESOPHAGOGASTRODUODENOSCOPY  11/22/2003   DR Gala Romney, erosive RE, Billroth I  . ESOPHAGOGASTRODUODENOSCOPY  08/26/10   Dr. Gala Romney- moderate severe ERE,  Scahtzki ring s/p dilation, Billroth I, linear gastric erosions, bx-gastric xanthelasma  . LEFT SHOULDER SURGERY  2009   DR HARRISON  . ORIF ANKLE FRACTURE Right 05/22/2013   Procedure: OPEN REDUCTION INTERNAL FIXATION (ORIF) RIGHT ANKLE FRACTURE;  Surgeon: Sanjuana Kava, MD;  Location: [facility];  Service: Orthopedics;  Laterality: Right;  . stomach ulcer  50 years ago    had some of her stomach removed     SOCIAL HISTORY:  Social History   Socioeconomic History  . Marital status: Widowed    Spouse name: Not on file  . Number of children: 2  . Years of education: 45  . Highest education level: 12th grade  Occupational History  . Occupation: retired from Estate manager/land agent: RETIRED  Tobacco Use  . Smoking status: Current Some Day Smoker    Packs/day: 0.50    Years: 60.00    Pack years: 30.00    Types: Cigarettes    Last attempt to quit: 09/09/2016    Years since quitting: 3.2  . Smokeless tobacco: Never Used  Vaping Use  . Vaping Use: Never used  Substance and Sexual Activity  . Alcohol use: No  . Drug use: No  . Sexual activity: Not Currently  Other Topics Concern  . Not on file  Social History Narrative   Lives with son alone    Social Determinants of Health   Financial Resource Strain:   . Difficulty of Paying Living Expenses:   Food Insecurity:   . Worried About Charity fundraiser in the Last Year:   . Arboriculturist in the Last Year:   Transportation Needs:   . Film/video editor (Medical):   Marland Kitchen Lack of Transportation (Non-Medical):   Physical Activity:   . Days of Exercise per Week:   . Minutes of Exercise per Session:   Stress:   . Feeling of Stress :   Social Connections:   . Frequency of Communication with Friends and Family:   . Frequency of Social Gatherings with Friends and Family:   . Attends Religious Services:   . Active Member of Clubs or Organizations:   . Attends Archivist Meetings:   Marland Kitchen Marital Status:   Intimate Partner  Violence:   . Fear of Current or Ex-Partner:   . Emotionally Abused:   Marland Kitchen Physically Abused:   . Sexually Abused:     FAMILY HISTORY:  Family History  Problem Relation Age of Onset  . Hypertension Mother   . Kidney failure Brother        on dialysis  . Diabetes Sister   . Diabetes Sister   . Hypertension Father   . Kidney failure Sister   . Kidney Stones Brother   . Breast cancer Son 63  . Gout Son 8  . Gout Nephew 57  . Liver disease Neg Hx   . Colon cancer  Neg Hx     CURRENT MEDICATIONS:  Current Outpatient Medications  Medication Sig Dispense Refill  . acetaminophen (TYLENOL) 500 MG tablet Take 500 mg by mouth every 6 (six) hours as needed for mild pain or moderate pain.     Marland Kitchen amLODipine (NORVASC) 10 MG tablet TAKE 1 TABLET DAILY 90 tablet 3  . CISPLATIN IV Inject into the vein. Days 1, 8 every 21 days    . clobetasol (TEMOVATE) 0.05 % external solution APP AA ON SCALP BID PRN NOT TO FACE GROIN OR UNDERARMS    . cycloSPORINE (RESTASIS) 0.05 % ophthalmic emulsion Place 1 drop into both eyes 2 (two) times daily.    . ergocalciferol (VITAMIN D2) 1.25 MG (50000 UT) capsule Take 1 capsule (50,000 Units total) by mouth once a week. 4 capsule 3  . fluorometholone (FML) 0.1 % ophthalmic suspension INSTILL 1 DROP INTO EACH EYE THREE TIMES DAILY FOR 14 DAYS  2  . fluticasone (CUTIVATE) 0.05 % cream APPLY TO THE AFFECTED AREA FACE TWICE DAILY AS NEEDED    . fluticasone (FLONASE) 50 MCG/ACT nasal spray Place 1 spray into both nostrils daily.    Marland Kitchen GEMCITABINE HCL IV Inject into the vein. Days 1, 8 every 21 days    . lidocaine (XYLOCAINE) 2 % solution RINSE WITH 5ML AS NEEDED TO RELIEVE MOUTH PAIN.    Marland Kitchen lidocaine-prilocaine (EMLA) cream Apply 1 application topically as needed. Apply to portacath site as needed 30 g 1  . loperamide (IMODIUM A-D) 2 MG tablet Take 2 mg by mouth 4 (four) times daily as needed for diarrhea or loose stools.     . metoprolol tartrate (LOPRESSOR) 25 MG tablet  TAKE 1 TABLET DAILY FOR BLOOD PRESSURE (DOSE REDUCTION) 90 tablet 3  . mirtazapine (REMERON) 15 MG tablet Take 15 mg by mouth at bedtime.    Marland Kitchen MYRBETRIQ 50 MG TB24 tablet Take 50 mg by mouth daily.    Marland Kitchen oxybutynin (DITROPAN-XL) 10 MG 24 hr tablet TAKE 1 TABLET(5 MG) BY MOUTH AT BEDTIME 30 tablet 6  . pantoprazole (PROTONIX) 20 MG tablet TAKE 1 TABLET DAILY 90 tablet 3  . traMADol (ULTRAM) 50 MG tablet Take 1 tablet (50 mg total) by mouth 3 (three) times daily. 30 tablet 2   No current facility-administered medications for this visit.    ALLERGIES:  Allergies  Allergen Reactions  . Iohexol Hives and Shortness Of Breath    **Patient can not have IV contrast, pt has breakthrough reaction even after 13 hour premeds** Do not give IV contrast PATIENT HAD TO BE TAKEN TO ED, C/O WHELPS, HIVES, DIFFICULTY BREATHING. PATIENT GIVEN IV CONTRAST AFTER 13 HOUR PRE MEDS ON 01/09/2017 WITH NO REACTION NOTED. On 10/19/17 patient had reaction despite premedication.    Marland Kitchen Aciphex [Rabeprazole Sodium] Other (See Comments)    unknown  . Amlodipine Besylate-Valsartan     Rash to high-dose (5/320)  . Esomeprazole Magnesium   . Omeprazole Other (See Comments)    Patient states "medication didn't work"  . Ciprofloxacin Rash  . Penicillins Swelling and Rash    Has patient had a PCN reaction causing immediate rash, facial/tongue/throat swelling, SOB or lightheadedness with hypotension: Yes Has patient had a PCN reaction causing severe rash involving mucus membranes or skin necrosis: Yes Has patient had a PCN reaction that required hospitalization: No Has patient had a PCN reaction occurring within the last 10 years: yes If all of the above answers are "NO", then may proceed with Cephalosporin use.   Marland Kitchen  Tessalon Perles [Benzonatate] Itching    Full head, face, neck, and chest itching.     PHYSICAL EXAM:  Performance status (ECOG): 1 - Symptomatic but completely ambulatory  There were no vitals filed for this  visit. Wt Readings from Last 3 Encounters:  11/22/19 93 lb 0.6 oz (42.2 kg)  11/10/19 96 lb 6.4 oz (43.7 kg)  11/07/19 94 lb 0.6 oz (42.7 kg)   Physical Exam Vitals reviewed.  Constitutional:      Appearance: Normal appearance.  Cardiovascular:     Rate and Rhythm: Normal rate and regular rhythm.     Pulses: Normal pulses.     Heart sounds: Normal heart sounds.  Pulmonary:     Effort: Pulmonary effort is normal.     Breath sounds: Normal breath sounds.  Abdominal:     Palpations: Abdomen is soft. There is no mass.     Tenderness: There is abdominal tenderness (epigastric region).  Musculoskeletal:     Right lower leg: No edema.     Left lower leg: No edema.  Neurological:     General: No focal deficit present.     Mental Status: She is alert and oriented to person, place, and time.  Psychiatric:        Mood and Affect: Mood normal.        Behavior: Behavior normal.     LABORATORY DATA:  I have reviewed the labs as listed.  CBC Latest Ref Rng & Units 11/10/2019 10/27/2019 10/13/2019  WBC 4.0 - 10.5 K/uL 4.0 5.6 3.8(L)  Hemoglobin 12.0 - 15.0 g/dL 8.4(L) 9.5(L) 8.6(L)  Hematocrit 36 - 46 % 25.4(L) 28.3(L) 26.1(L)  Platelets 150 - 400 K/uL 106(L) 159 64(L)   CMP Latest Ref Rng & Units 11/10/2019 10/27/2019 10/13/2019  Glucose 70 - 99 mg/dL 98 115(H) 102(H)  BUN 8 - 23 mg/dL 21 31(H) 27(H)  Creatinine 0.44 - 1.00 mg/dL 1.80(H) 1.69(H) 1.69(H)  Sodium 135 - 145 mmol/L 134(L) 138 138  Potassium 3.5 - 5.1 mmol/L 3.5 3.5 3.7  Chloride 98 - 111 mmol/L 93(L) 103 104  CO2 22 - 32 mmol/L '30 24 24  ' Calcium 8.9 - 10.3 mg/dL 8.1(L) 8.3(L) 8.3(L)  Total Protein 6.5 - 8.1 g/dL 6.7 7.6 7.2  Total Bilirubin 0.3 - 1.2 mg/dL 1.0 0.7 0.8  Alkaline Phos 38 - 126 U/L 188(H) 293(H) 289(H)  AST 15 - 41 U/L 24 87(H) 37  ALT 0 - 44 U/L 21 48(H) 34    DIAGNOSTIC IMAGING:  I have independently reviewed the scans and discussed with the patient. No results found.   ASSESSMENT:  1. Metastatic  pancreatic cancer to the liver: -Gemcitabine and Abraxane from November 2015 through August 2018 with progression. -Gemcitabine and Tarceva from 01/28/2017 through 10/07/2017 with progression. -21 cycles of infusional 5-FU and Onivyde from 10/21/2017 through 09/08/2018. -7 cycles of FOLFOX from 09/22/2018 through 12/29/2018. -Gemcitabine and cisplatin day 1 and day 15 started on 02/10/2019. -CT CAP on 09/16/2019 showed mass in the posterior liver dome measuring 4.6 x 3.6 cm compared to 4 x 3.2 cm on 06/01/2019. Left lobe mass also improved with 3 mm. No new hepatic lesions. Study limited by lack of IV contrast. No new areas of metastatic disease.   PLAN:  1. Metastatic pancreatic cancer to the liver: -Last treatment with gemcitabine was on 10/27/2019. -He lost about 1 pound.  I have reviewed her labs today.  LFTs are elevated with AST of 132 and ALT of 91. -We will hold any  further chemotherapy.  I have again discussed with the patient and her son that there are no other options left. -She will receive 1 L of fluids today over 2 hours.  2. Hypomagnesemia: -Magnesium today is 1.7.  3. Left lateral chest wall pain: -She has pain under bilateral ribs.  She will take tramadol 50 mg 3 times a day as needed.  I have given refill.  4. Overactive bladder: -Continue oxybutynin 10 mg daily.  5. Weight loss: -Continue Remeron 15 mg at bedtime.  She lost 1 pound in last visit.  6. Vitamin D deficiency: -Continue vitamin D supplements.  7.  UTI: -She was treated with doxycycline.  She still has dysuria.  She complains of suprapubic pain.  We will prescribe Macrobid 100 mg twice daily for 5 days.   Orders placed this encounter:  No orders of the defined types were placed in this encounter.    Derek Jack, MD [place] [phone]   I, Milinda Antis, am acting as a scribe for Dr. Sanda Linger.  I, Derek Jack MD, have reviewed the above  documentation for accuracy and completeness, and I agree with the above.

## 2019-11-24 NOTE — Progress Notes (Signed)
Patient presents today for treatment and follow up visit with Dr. Delton Coombes. Vital signs are within parameters for treatment. Potassium 3.0, Creatinine 1.21, AST 132, ALT 91, Total Bilirubin 2.5. Labs reviewed by Dr. Delton Coombes.   Message received from [facility] LPN/ Dr. Melanie Crazier treatment today due to lab results and UTI. Infuse 1 Liter of "House fluids" 1Liter Sodium chloride 0.9% with potassium chloride 20 mEq, Magnesium Sulfate 2 Grams over 2 hours.   Patient complains of pain in her abdomen she rates a 4/10. Patient states she took her pain medication at home prior to visit and the pain has decreased.   Treatment given today per MD orders. Tolerated infusion without adverse affects. Vital signs stable. No complaints at this time. Discharged from clinic via wheel chair. F/U with [facility] as scheduled.

## 2019-11-24 NOTE — Patient Instructions (Signed)
[facility] Discharge Instructions for Patients Receiving Chemotherapy  Today you received the following chemotherapy agents   To help prevent nausea and vomiting after your treatment, we encourage you to take your nausea medication   If you develop nausea and vomiting that is not controlled by your nausea medication, call the clinic.   BELOW ARE SYMPTOMS THAT SHOULD BE REPORTED IMMEDIATELY:  *FEVER GREATER THAN 100.5 F  *CHILLS WITH OR WITHOUT FEVER  NAUSEA AND VOMITING THAT IS NOT CONTROLLED WITH YOUR NAUSEA MEDICATION  *UNUSUAL SHORTNESS OF BREATH  *UNUSUAL BRUISING OR BLEEDING  TENDERNESS IN MOUTH AND THROAT WITH OR WITHOUT PRESENCE OF ULCERS  *URINARY PROBLEMS  *BOWEL PROBLEMS  UNUSUAL RASH Items with * indicate a potential emergency and should be followed up as soon as possible.  Feel free to call the clinic should you have any questions or concerns. The clinic phone number is [phone].  Please show the CHEMO ALERT CARD at check-in to the Emergency Department and triage nurse.   

## 2019-11-24 NOTE — Patient Instructions (Signed)
[place] at [facility] Discharge Instructions  You were seen today by Dr. Delton Coombes. He went over your recent results. You received fluids today but no treatment. You will be prescribed nitrofurantoin for your UTI; take it twice daily for 5 days. Drink plenty of water daily, at least 4 16 ounce bottles. Dr. Delton Coombes will see you back in 1 month for labs and follow up.   Thank you for choosing [place] at [facility] to provide your oncology and hematology care.  To afford each patient quality time with our provider, please arrive at least 15 minutes before your scheduled appointment time.   If you have a lab appointment with the [facility] please come in thru the Main Entrance and check in at the main information desk  You need to re-schedule your appointment should you arrive 10 or more minutes late.  We strive to give you quality time with our providers, and arriving late affects you and other patients whose appointments are after yours.  Also, if you no show three or more times for appointments you may be dismissed from the clinic at the providers discretion.     Again, thank you for choosing [facility].  Our hope is that these requests will decrease the amount of time that you wait before being seen by our physicians.       _____________________________________________________________  Should you have questions after your visit to [facility], please contact our office at [phone] between the hours of 8:00 a.m. and 4:30 p.m.  Voicemails left after 4:00 p.m. will not be returned until the following business day.  For prescription refill requests, have your pharmacy contact our office and allow 72 hours.    Cancer Center Support Programs:   > Cancer Support Group  2nd Tuesday of the month 1pm-2pm, Journey Room

## 2019-11-25 ENCOUNTER — Ambulatory Visit (HOSPITAL_COMMUNITY): Payer: Medicare HMO

## 2019-11-25 ENCOUNTER — Encounter: Payer: Self-pay | Admitting: Family Medicine

## 2019-11-25 NOTE — Progress Notes (Signed)
    [name]     MRN: [id number]      DOB: [birth year]  HPI: Patient is in for annual physical exam. No other health concerns are expressed or addressed at the visit. Recent labs, if available are reviewed. Immunization is reviewed , and  updated if needed.   PE: BP 130/80   Pulse (!) 59   Resp 16   Ht 5\' 3"  (1.6 m)   Wt 93 lb 0.6 oz (42.2 kg)   BMI 16.48 kg/m   Pleasant  female, alert and oriented x 3, in no cardio-pulmonary distress.cachectic Afebrile. HEENT No facial trauma or asymetry. Sinuses non tender.  Extra occullar muscles intact.. External ears normal, . Neck: decreased ROM, no adenopathy,JVD or thyromegaly.No bruits.  Chest: Clear to ascultation bilaterally.No crackles or wheezes. Non tender to palpation   Cardiovascular system; Heart sounds normal,  S1 and  S2 ,no S3.  No murmur, or thrill. Apical beat not displaced Peripheral pulses normal.  Abdomen: Soft, mild epigastric and upper abdominal  tenderness, no guarding,  or rebound.   .   Musculoskeletal exam: Decreased ROM of spine, hips , shoulders and knees. . Generalized  muscle wasting and  atrophy.   Neurologic: Cranial nerves 2 to 12 intact. Power, tone decreased throughout  disturbance in gait. Fine  tremor.  Skin: Intact, no ulceration, erythema , scaling or rash noted. Pigmentation normal throughout  Psych; Normal mood and affect. Judgement and concentration normal   Assessment & Plan:  Annual physical exam Annual exam as documented. Home safety is discussed. Pt encouraged to increase frequency of eating and to eat whatever she chooses to    Immunization  needs are specifically addressed at this visit.

## 2019-11-25 NOTE — Assessment & Plan Note (Signed)
Annual exam as documented. Home safety is discussed. Pt encouraged to increase frequency of eating and to eat whatever she chooses to    Immunization  needs are specifically addressed at this visit.

## 2019-12-08 ENCOUNTER — Inpatient Hospital Stay (HOSPITAL_BASED_OUTPATIENT_CLINIC_OR_DEPARTMENT_OTHER): Payer: Medicare HMO | Admitting: Hematology

## 2019-12-08 ENCOUNTER — Encounter (HOSPITAL_COMMUNITY): Payer: Self-pay | Admitting: Hematology

## 2019-12-08 ENCOUNTER — Inpatient Hospital Stay (HOSPITAL_COMMUNITY): Payer: Medicare HMO

## 2019-12-08 ENCOUNTER — Other Ambulatory Visit: Payer: Self-pay

## 2019-12-08 VITALS — BP 158/54 | HR 58 | Temp 97.2°F | Resp 16

## 2019-12-08 VITALS — BP 139/62 | HR 66 | Temp 97.1°F | Resp 17 | Wt 93.0 lb

## 2019-12-08 DIAGNOSIS — C787 Secondary malignant neoplasm of liver and intrahepatic bile duct: Secondary | ICD-10-CM

## 2019-12-08 DIAGNOSIS — R17 Unspecified jaundice: Secondary | ICD-10-CM | POA: Diagnosis not present

## 2019-12-08 DIAGNOSIS — C259 Malignant neoplasm of pancreas, unspecified: Secondary | ICD-10-CM

## 2019-12-08 DIAGNOSIS — D6481 Anemia due to antineoplastic chemotherapy: Secondary | ICD-10-CM

## 2019-12-08 DIAGNOSIS — T451X5A Adverse effect of antineoplastic and immunosuppressive drugs, initial encounter: Secondary | ICD-10-CM

## 2019-12-08 LAB — CBC WITH DIFFERENTIAL/PLATELET
Abs Immature Granulocytes: 0.02 10*3/uL (ref 0.00–0.07)
Basophils Absolute: 0 10*3/uL (ref 0.0–0.1)
Basophils Relative: 0 %
Eosinophils Absolute: 0.1 10*3/uL (ref 0.0–0.5)
Eosinophils Relative: 1 %
HCT: 28.2 % — ABNORMAL LOW (ref 36.0–46.0)
Hemoglobin: 9.8 g/dL — ABNORMAL LOW (ref 12.0–15.0)
Immature Granulocytes: 0 %
Lymphocytes Relative: 11 %
Lymphs Abs: 0.9 10*3/uL (ref 0.7–4.0)
MCH: 32.8 pg (ref 26.0–34.0)
MCHC: 34.8 g/dL (ref 30.0–36.0)
MCV: 94.3 fL (ref 80.0–100.0)
Monocytes Absolute: 0.6 10*3/uL (ref 0.1–1.0)
Monocytes Relative: 7 %
Neutro Abs: 6.6 10*3/uL (ref 1.7–7.7)
Neutrophils Relative %: 81 %
Platelets: 149 10*3/uL — ABNORMAL LOW (ref 150–400)
RBC: 2.99 MIL/uL — ABNORMAL LOW (ref 3.87–5.11)
RDW: 16.4 % — ABNORMAL HIGH (ref 11.5–15.5)
WBC: 8.1 10*3/uL (ref 4.0–10.5)
nRBC: 0 % (ref 0.0–0.2)

## 2019-12-08 LAB — COMPREHENSIVE METABOLIC PANEL
ALT: 51 U/L — ABNORMAL HIGH (ref 0–44)
AST: 95 U/L — ABNORMAL HIGH (ref 15–41)
Albumin: 2.6 g/dL — ABNORMAL LOW (ref 3.5–5.0)
Alkaline Phosphatase: 206 U/L — ABNORMAL HIGH (ref 38–126)
Anion gap: 12 (ref 5–15)
BUN: 22 mg/dL (ref 8–23)
CO2: 30 mmol/L (ref 22–32)
Calcium: 7.6 mg/dL — ABNORMAL LOW (ref 8.9–10.3)
Chloride: 93 mmol/L — ABNORMAL LOW (ref 98–111)
Creatinine, Ser: 1.25 mg/dL — ABNORMAL HIGH (ref 0.44–1.00)
GFR calc Af Amer: 44 mL/min — ABNORMAL LOW (ref >60)
GFR calc non Af Amer: 38 mL/min — ABNORMAL LOW (ref >60)
Glucose, Bld: 114 mg/dL — ABNORMAL HIGH (ref 70–99)
Potassium: 2.6 mmol/L — CL (ref 3.5–5.1)
Sodium: 135 mmol/L (ref 135–145)
Total Bilirubin: 7.9 mg/dL — ABNORMAL HIGH (ref 0.3–1.2)
Total Protein: 7 g/dL (ref 6.5–8.1)

## 2019-12-08 LAB — URINALYSIS, ROUTINE W REFLEX MICROSCOPIC
Glucose, UA: NEGATIVE mg/dL
Hgb urine dipstick: NEGATIVE
Ketones, ur: NEGATIVE mg/dL
Leukocytes,Ua: NEGATIVE
Nitrite: NEGATIVE
Protein, ur: 30 mg/dL — AB
Specific Gravity, Urine: 1.015 (ref 1.005–1.030)
pH: 7 (ref 5.0–8.0)

## 2019-12-08 LAB — LACTATE DEHYDROGENASE: LDH: 144 U/L (ref 98–192)

## 2019-12-08 LAB — URINALYSIS, MICROSCOPIC (REFLEX)

## 2019-12-08 LAB — MAGNESIUM: Magnesium: 1.5 mg/dL — ABNORMAL LOW (ref 1.7–2.4)

## 2019-12-08 LAB — BILIRUBIN, DIRECT: Bilirubin, Direct: 4.8 mg/dL — ABNORMAL HIGH (ref 0.0–0.2)

## 2019-12-08 MED ORDER — SODIUM CHLORIDE 0.9 % IV SOLN
Freq: Once | INTRAVENOUS | Status: AC
  Filled 2019-12-08: qty 1000

## 2019-12-08 MED ORDER — SODIUM CHLORIDE 0.9 % IV SOLN
10.0000 mg | Freq: Once | INTRAVENOUS | Status: DC
  Filled 2019-12-08: qty 1

## 2019-12-08 MED ORDER — SODIUM CHLORIDE 0.9 % IV SOLN
Freq: Once | INTRAVENOUS | Status: DC
  Filled 2019-12-08: qty 5

## 2019-12-08 MED ORDER — SODIUM CHLORIDE 0.9% FLUSH: 10.0000 mL | INTRAVENOUS | Status: DC | PRN

## 2019-12-08 MED ORDER — POTASSIUM CHLORIDE CRYS ER 10 MEQ PO TBCR
EXTENDED_RELEASE_TABLET | ORAL | Status: AC
  Filled 2019-12-08: qty 4

## 2019-12-08 MED ORDER — HEPARIN SOD (PORK) LOCK FLUSH 100 UNIT/ML IV SOLN
500.0000 [IU] | Freq: Once | INTRAVENOUS | Status: AC | PRN
  Administered 2019-12-08: 500 [IU]

## 2019-12-08 MED ORDER — SODIUM CHLORIDE 0.9 % IV SOLN: 680.0000 mg | Freq: Once | INTRAVENOUS | Status: DC

## 2019-12-08 MED ORDER — POTASSIUM CHLORIDE CRYS ER 20 MEQ PO TBCR
40.0000 meq | EXTENDED_RELEASE_TABLET | Freq: Two times a day (BID) | ORAL | Status: DC
  Administered 2019-12-08: 40 meq via ORAL
  Filled 2019-12-08: qty 2

## 2019-12-08 MED ORDER — POTASSIUM CHLORIDE ER 10 MEQ PO TBCR: 20.0000 meq | EXTENDED_RELEASE_TABLET | Freq: Every day | ORAL | 3 refills | Status: AC

## 2019-12-08 MED ORDER — PALONOSETRON HCL INJECTION 0.25 MG/5ML: 0.2500 mg | Freq: Once | INTRAVENOUS | Status: DC

## 2019-12-08 MED ORDER — POTASSIUM CHLORIDE CRYS ER 20 MEQ PO TBCR
40.0000 meq | EXTENDED_RELEASE_TABLET | Freq: Two times a day (BID) | ORAL | Status: DC
  Administered 2019-12-08: 40 meq via ORAL

## 2019-12-08 MED ORDER — SODIUM CHLORIDE 0.9 % IV SOLN: 20.0000 mg | Freq: Once | INTRAVENOUS | Status: DC

## 2019-12-08 MED ORDER — POTASSIUM CHLORIDE ER 8 MEQ PO CPCR
40.0000 meq | ORAL_CAPSULE | Freq: Two times a day (BID) | ORAL | Status: DC
  Filled 2019-12-08 (×2): qty 5

## 2019-12-08 MED ORDER — SODIUM CHLORIDE 0.9 % IV SOLN: Freq: Once | INTRAVENOUS | Status: AC

## 2019-12-08 NOTE — Progress Notes (Signed)
Patient has been assessed, vital signs and labs have been reviewed by Dr. Delton Coombes. ANC, Creatinine, LFTs, and Platelets are within treatment parameters, potassium is low today, please give 40 mEq PO prior and post treatment and 1 liter of fluids with electrolytes per Dr. Delton Coombes. The patient is good to proceed with treatment at this time.

## 2019-12-08 NOTE — Patient Instructions (Signed)
[facility] Discharge Instructions for Patients Receiving Chemotherapy  Today you received the following chemotherapy agents   To help prevent nausea and vomiting after your treatment, we encourage you to take your nausea medication   If you develop nausea and vomiting that is not controlled by your nausea medication, call the clinic.   BELOW ARE SYMPTOMS THAT SHOULD BE REPORTED IMMEDIATELY:  *FEVER GREATER THAN 100.5 F  *CHILLS WITH OR WITHOUT FEVER  NAUSEA AND VOMITING THAT IS NOT CONTROLLED WITH YOUR NAUSEA MEDICATION  *UNUSUAL SHORTNESS OF BREATH  *UNUSUAL BRUISING OR BLEEDING  TENDERNESS IN MOUTH AND THROAT WITH OR WITHOUT PRESENCE OF ULCERS  *URINARY PROBLEMS  *BOWEL PROBLEMS  UNUSUAL RASH Items with * indicate a potential emergency and should be followed up as soon as possible.  Feel free to call the clinic should you have any questions or concerns. The clinic phone number is [phone].  Please show the CHEMO ALERT CARD at check-in to the Emergency Department and triage nurse.   

## 2019-12-08 NOTE — Progress Notes (Signed)
Labs reviewed with Dr Raliegh Ip.  K 2.6.  40 mEq K PO give pre and post treatment.  Prescription for K sent to pharmacy. Proceed with treatment today.  House fluids given as well with treatment today.  Bili level 7.9.  Treatment held today.   Tolerated house fluids today.  Vital signs WNL.  Discharged via wheelchair with son.

## 2019-12-08 NOTE — Progress Notes (Signed)
Will give a 500 ml bolus of normal saline over an hour post treatment per MD.

## 2019-12-08 NOTE — Progress Notes (Signed)
[place], [place] [address]   CLINIC:  Medical Oncology/Hematology  PCP:  Fayrene Helper, MD [address] [place] / [place] [address] [phone]   REASON FOR VISIT:  Follow-up for metastatic pancreatic cancer to the liver  PRIOR THERAPY: 1. Gemcitabine and Abraxane from 03/2014 to 12/2016 2. Gemcitabine and Tarceva from 01/28/2017 to 10/07/2017 3. Infusional 5-FU and Onivyde x 21 cycles from 10/21/2017 to 09/08/2018 4. FOLFOX x 7 cycles from 09/22/2018 to 12/29/2018  NGS Results: Foundation 1 MS--stable, TMB cannot be determined, K-ras G 12 V, T p53 mutation  CURRENT THERAPY: cisplatin, gemcitabine and Aloxi  BRIEF ONCOLOGIC HISTORY:  Oncology History  Pancreatic cancer metastasized to liver ([place])  03/10/2014 Initial Diagnosis   Pancreatic cancer metastasized to liver   03/22/2014 - 03/05/2016 Chemotherapy   Abraxane/Gemzar days 1, 8, every 28 days.  Day 15 was cancelled due to leukopenia and thrombocytopenia on day 15 cycle 1.   05/24/2014 Treatment Plan Change   Day 8 of cycle 3 is held with ANC of 1.1   06/05/2014 Imaging   CT C/A/P . Interval decrease in size of the pancreatic tail mass.Improved hepatic metastatic disease. No new lesions. No CT findings for metastatic disease involving the chest.   08/09/2014 Tumor Marker   CA 19-9= 33 (WNL)   10/26/2014 Imaging   MRI- Continued interval decrease in size of the hepatic metastatic lesions and no new lesions are identified. Continued decrease in size of the pancreatic tail lesion.   01/03/2015 Tumor Marker   Results for CADANCE, RAUS (MRN [id number]) as of 01/18/2015 12:52  01/03/2015 10:00 CA 19-9: 14    01/17/2015 Imaging   MRI- Response to therapy of hepatic metastasis.  Similar size of a pancreatic tail lesion.   03/20/2015 Imaging   MRI-L spine- Severe disc space narrowing at L2-L3, with endplate reactive changes. Large disc extrusion into the ventral epidural  space,central to the RIGHT with a cephalad migrated free fragment. Additional Large disc extrusion into the retroperitone...   03/22/2015 Imaging   CT pelvis- No evidence for metastatic disease within the pelvis.   07/24/2015 Imaging   MRI abd- Continued response to therapy, with no residual detectable liver metastases. No new sites of metastatic disease in the abdomen.    08/15/2015 Code Status    She confirms desire for DNR status.   12/21/2015 Imaging   MRI liver- Severe image degradation due to motion artifact reducing diagnostic sensitivity and specificity. Reduce conspicuity of the pancreatic tail lesion suggesting further improvement. The original liver lesions have resolved.   03/05/2016 Treatment Plan Change   Chemotherapy holiday/Break after 2 years worth of treatment   04/07/2016 Imaging   CT chest- When compared to recent chest CT, new minimally displaced anterior left sixth rib fracture. Slight increase in subcarinal adenopathy   04/28/2016 Imaging   Bone density- BMD as determined from Femur Neck Right is 0.705 g/cm2 with a T-Score of -2.4. This patient is considered osteopenic according to [facility]) criteria. Compared with the prior study on 02/23/2013, the BMD of the lumbar spine/rt. femoral neck show a statistically significant decrease.    05/23/2016 Imaging   MRI abd- 1. Exam is significantly degraded by patient respiratory motion. Consider follow-up exams with CT abdomen with without contrast per pancreatic protocol. 2. Fullness in tail of pancreas is more prominent and potentially increased in size. Recommend close attention on follow-up. (Consider CT as above) 3. No explanation  for back pain.   09/11/2016 Imaging   MRI pancreas- Interval progression of pancreatic tail lesion. New differential perfusion right liver with areas of heterogeneity. Underlying metastatic disease in this region not excluded.   09/11/2016 Progression   MRi in  conjunction with rising CA 19-9 are indicative of relapse of disease.   01/09/2017 Progression   CT C/A/P: 2.1 x 2.9 cm lesion in the pancreatic tail and abutting the splenic hilum, corresponding to known primary pancreatic neoplasm, mildly increased.  Scattered small hypoenhancing lesions in the liver measuring up to 10 mm, approximately 8-10 in number, suspicious for hepatic metastases.  No findings specific for metastatic disease in the chest.  Additional ancillary findings as above.    01/27/2017 - 10/20/2017 Chemotherapy   The patient had gemcitabine (GEMZAR) 1,292 mg in sodium chloride 0.9 % 250 mL chemo infusion, 800 mg/m2 = 1,292 mg, Intravenous,  Once, 9 of 12 cycles Administration: 1,292 mg (01/28/2017), 1,292 mg (02/11/2017), 1,292 mg (02/25/2017), 1,292 mg (03/11/2017), 1,292 mg (03/25/2017), 1,292 mg (04/08/2017), 1,292 mg (04/22/2017), 1,292 mg (04/29/2017), 1,292 mg (05/20/2017), 1,292 mg (06/03/2017), 1,292 mg (06/24/2017), 1,292 mg (07/08/2017), 1,292 mg (07/29/2017), 1,292 mg (08/12/2017), 1,292 mg (08/26/2017), 1,292 mg (09/09/2017), 1,292 mg (09/23/2017), 1,292 mg (10/07/2017)  for chemotherapy treatment.    10/21/2017 - 09/22/2018 Chemotherapy   The patient had leucovorin 700 mg in dextrose 5 % 250 mL infusion, 644 mg, Intravenous,  Once, 22 of 22 cycles Administration: 700 mg (10/21/2017), 700 mg (11/04/2017), 700 mg (11/18/2017), 700 mg (12/02/2017), 700 mg (12/16/2017), 700 mg (12/30/2017), 700 mg (01/13/2018), 700 mg (01/27/2018), 700 mg (02/10/2018), 700 mg (02/24/2018), 700 mg (03/10/2018), 700 mg (03/24/2018), 700 mg (04/28/2018), 700 mg (05/17/2018), 700 mg (05/31/2018), 700 mg (06/16/2018), 700 mg (06/30/2018), 700 mg (07/21/2018), 700 mg (08/04/2018), 700 mg (08/25/2018), 700 mg (09/08/2018) ondansetron (ZOFRAN) 8 mg in sodium chloride 0.9 % 50 mL IVPB, , Intravenous,  Once, 21 of 21 cycles Administration:  (11/04/2017),  (01/27/2018),  (02/10/2018),  (02/24/2018),  (03/10/2018),  (03/24/2018),   (04/28/2018),  (05/17/2018),  (05/31/2018),  (06/16/2018),  (06/30/2018),  (07/21/2018),  (08/04/2018),  (08/25/2018),  (09/08/2018) fluorouracil (ADRUCIL) 3,850 mg in sodium chloride 0.9 % 73 mL chemo infusion, 2,400 mg/m2 = 3,850 mg, Intravenous, 1 Day/Dose, 22 of 22 cycles Dose modification: 1,920 mg/m2 (original dose 2,400 mg/m2, Cycle 2, Reason: Provider Judgment, Comment: mucisitis) Administration: 3,850 mg (10/21/2017), 3,100 mg (11/04/2017), 3,100 mg (11/18/2017), 3,100 mg (12/02/2017), 3,100 mg (12/16/2017), 3,100 mg (12/30/2017), 3,100 mg (01/13/2018), 3,100 mg (01/27/2018), 3,100 mg (02/10/2018), 3,100 mg (02/24/2018), 3,100 mg (03/10/2018), 3,100 mg (03/24/2018), 3,100 mg (04/28/2018), 3,100 mg (05/17/2018), 3,100 mg (05/31/2018), 3,100 mg (06/16/2018), 3,100 mg (06/30/2018), 3,100 mg (07/21/2018), 3,100 mg (08/04/2018), 3,100 mg (08/25/2018), 3,100 mg (09/08/2018) irinotecan LIPOSOME (ONIVYDE) 81.7 mg in sodium chloride 0.9 % 500 mL chemo infusion, 50 mg/m2 = 81.7 mg (100 % of original dose 50 mg/m2), Intravenous, Once, 22 of 22 cycles Dose modification: 50 mg/m2 (original dose 50 mg/m2, Cycle 1, Reason: Provider Judgment), 50 mg/m2 (original dose 50 mg/m2, Cycle 2, Reason: Provider Judgment) Administration: 81.7 mg (10/21/2017), 81.7 mg (11/04/2017), 81.7 mg (11/18/2017), 81.7 mg (12/02/2017), 81.7 mg (12/16/2017), 81.7 mg (12/30/2017), 81.7 mg (01/13/2018), 81.7 mg (01/27/2018), 81.7 mg (02/10/2018), 81.7 mg (02/24/2018), 81.7 mg (03/10/2018), 81.7 mg (03/24/2018), 81.7 mg (04/28/2018), 81.7 mg (05/17/2018), 81.7 mg (05/31/2018), 81.7 mg (06/16/2018), 81.7 mg (06/30/2018), 81.7 mg (07/21/2018), 81.7 mg (08/04/2018), 81.7 mg (08/25/2018), 81.7 mg (09/08/2018)  for chemotherapy treatment.    09/22/2018 - 01/11/2019 Chemotherapy  The patient had palonosetron (ALOXI) injection 0.25 mg, 0.25 mg, Intravenous,  Once, 7 of 8 cycles Administration: 0.25 mg (09/22/2018), 0.25 mg (10/11/2018), 0.25 mg (10/25/2018), 0.25 mg (12/01/2018), 0.25 mg (12/15/2018),  0.25 mg (11/17/2018), 0.25 mg (12/29/2018) pegfilgrastim (NEULASTA) injection 6 mg, 6 mg, Subcutaneous, Once, 5 of 5 cycles Administration: 6 mg (10/13/2018), 6 mg (10/27/2018), 6 mg (12/03/2018), 6 mg (12/17/2018), 6 mg (11/19/2018) pegfilgrastim-cbqv (UDENYCA) injection 6 mg, 6 mg, Subcutaneous, Once, 1 of 2 cycles Administration: 6 mg (12/31/2018) leucovorin 628 mg in dextrose 5 % 250 mL infusion, 400 mg/m2 = 628 mg, Intravenous,  Once, 7 of 8 cycles Administration: 628 mg (09/22/2018), 628 mg (10/11/2018), 628 mg (10/25/2018), 628 mg (12/01/2018), 628 mg (12/15/2018), 628 mg (11/17/2018), 628 mg (12/29/2018) oxaliplatin (ELOXATIN) 100 mg in dextrose 5 % 500 mL chemo infusion, 65 mg/m2 = 100 mg (100 % of original dose 65 mg/m2), Intravenous,  Once, 7 of 8 cycles Dose modification: 65 mg/m2 (original dose 65 mg/m2, Cycle 1, Reason: Provider Judgment) Administration: 100 mg (09/22/2018), 100 mg (10/11/2018), 100 mg (10/25/2018), 100 mg (12/01/2018), 100 mg (12/15/2018), 100 mg (11/17/2018), 100 mg (12/29/2018) fluorouracil (ADRUCIL) chemo injection 500 mg, 320 mg/m2 = 500 mg (100 % of original dose 320 mg/m2), Intravenous,  Once, 7 of 8 cycles Dose modification: 320 mg/m2 (original dose 320 mg/m2, Cycle 1, Reason: Provider Judgment) Administration: 500 mg (09/22/2018), 500 mg (10/11/2018), 500 mg (10/25/2018), 500 mg (12/01/2018), 500 mg (12/15/2018), 500 mg (11/17/2018), 500 mg (12/29/2018) fluorouracil (ADRUCIL) 3,000 mg in sodium chloride 0.9 % 90 mL chemo infusion, 1,920 mg/m2 = 3,000 mg (100 % of original dose 1,920 mg/m2), Intravenous, 1 Day/Dose, 7 of 8 cycles Dose modification: 1,920 mg/m2 (original dose 1,920 mg/m2, Cycle 1, Reason: Provider Judgment) Administration: 3,000 mg (09/22/2018), 3,000 mg (10/11/2018), 3,000 mg (10/25/2018), 3,000 mg (12/01/2018), 3,000 mg (12/15/2018), 3,000 mg (11/17/2018), 3,000 mg (12/29/2018)  for chemotherapy treatment.    12/24/2018 Genetic Testing   Negative genetic testing on the common hereditary  cancer panel.  The Common Hereditary Gene Panel offered by [facility] includes sequencing and/or deletion duplication testing of the following 48 genes: APC, ATM, AXIN2, BARD1, BMPR1A, BRCA1, BRCA2, BRIP1, CDH1, CDK4, CDKN2A (p14ARF), CDKN2A (p16INK4a), CHEK2, CTNNA1, DICER1, EPCAM (Deletion/duplication testing only), GREM1 (promoter region deletion/duplication testing only), KIT, MEN1, MLH1, MSH2, MSH3, MSH6, MUTYH, NBN, NF1, NHTL1, PALB2, PDGFRA, PMS2, POLD1, POLE, PTEN, RAD50, RAD51C, RAD51D, RNF43, SDHB, SDHC, SDHD, SMAD4, SMARCA4. STK11, TP53, TSC1, TSC2, and VHL.  The following genes were evaluated for sequence changes only: SDHA and HOXB13 c.251G>A variant only. The report date is December 24, 2018.  DICER1 VUS identified.  This will not change medical decision making.   02/10/2019 -  Chemotherapy   The patient had palonosetron (ALOXI) injection 0.25 mg, 0.25 mg, Intravenous,  Once, 10 of 11 cycles Administration: 0.25 mg (02/10/2019), 0.25 mg (02/24/2019), 0.25 mg (03/10/2019), 0.25 mg (03/17/2019), 0.25 mg (03/31/2019), 0.25 mg (04/14/2019), 0.25 mg (05/12/2019), 0.25 mg (05/30/2019), 0.25 mg (06/14/2019), 0.25 mg (06/28/2019), 0.25 mg (07/13/2019), 0.25 mg (08/04/2019), 0.25 mg (08/18/2019), 0.25 mg (09/29/2019), 0.25 mg (10/27/2019) pegfilgrastim-jmdb (FULPHILA) injection 6 mg, 6 mg, Subcutaneous,  Once, 7 of 8 cycles Administration: 6 mg (02/25/2019), 6 mg (03/18/2019), 6 mg (05/31/2019), 6 mg (07/14/2019), 6 mg (08/05/2019), 6 mg (08/19/2019) pegfilgrastim-cbqv (UDENYCA) injection 6 mg, 6 mg, Subcutaneous, Once, 1 of 1 cycle Administration: 6 mg (06/29/2019) CISplatin (PLATINOL) 31 mg in sodium chloride 0.9 % 250 mL chemo infusion, 20 mg/m2 = 31 mg (80 % of  original dose 25 mg/m2), Intravenous,  Once, 8 of 9 cycles Dose modification: 20 mg/m2 (80 % of original dose 25 mg/m2, Cycle 1, Reason: Other (see comments), Comment: renal dysfunction) Administration: 31 mg (02/10/2019), 31 mg (02/24/2019), 31 mg (03/10/2019), 31  mg (03/17/2019), 31 mg (03/31/2019), 31 mg (04/14/2019), 31 mg (05/30/2019), 31 mg (06/14/2019), 31 mg (06/28/2019), 31 mg (07/13/2019), 31 mg (08/04/2019), 31 mg (08/18/2019) gemcitabine (GEMZAR) 1,140 mg in sodium chloride 0.9 % 250 mL chemo infusion, 750 mg/m2 = 1,140 mg (75 % of original dose 1,000 mg/m2), Intravenous,  Once, 10 of 11 cycles Dose modification: 750 mg/m2 (75 % of original dose 1,000 mg/m2, Cycle 1, Reason: Provider Judgment), 500 mg/m2 (original dose 1,000 mg/m2, Cycle 10, Reason: Provider Judgment) Administration: 1,140 mg (02/10/2019), 1,140 mg (02/24/2019), 1,140 mg (03/10/2019), 1,140 mg (03/17/2019), 1,140 mg (03/31/2019), 1,140 mg (04/14/2019), 1,140 mg (05/12/2019), 1,140 mg (05/30/2019), 1,140 mg (06/14/2019), 1,140 mg (06/28/2019), 1,140 mg (07/13/2019), 1,140 mg (08/04/2019), 1,140 mg (08/18/2019), 1,140 mg (09/29/2019), 760 mg (10/27/2019) fosaprepitant (EMEND) 150 mg, dexamethasone (DECADRON) 12 mg in sodium chloride 0.9 % 145 mL IVPB, , Intravenous,  Once, 10 of 11 cycles Administration:  (02/10/2019),  (02/24/2019),  (03/10/2019),  (03/17/2019),  (03/31/2019),  (04/14/2019),  (05/12/2019),  (05/30/2019),  (06/14/2019),  (06/28/2019),  (07/13/2019),  (08/04/2019),  (08/18/2019),  (10/27/2019)  for chemotherapy treatment.      CANCER STAGING: Cancer Staging Pancreatic cancer metastasized to liver [facility]) Staging form: Pancreas, AJCC 7th Edition - Clinical: Stage IV (T3, N1, M1) - Signed by Baird Cancer, PA-C on 03/16/2014 - Pathologic: No stage assigned - Unsigned   INTERVAL HISTORY:  Ms. Narmeen Kerper, a 86 y.o. female, returns for routine follow-up . Siara was last seen on 11/24/2019.  Overall, she tells me she has been feeling pretty well. Her appetite has been ok; she has a waxing and waning appetite. She thinks that she still has a UTI, she notes that her urine is still dark. At first, she got her Tramadol and her antibiotic switched up   She does not have a large fluid intake. She  hasn't drank much Ensure clear. She says she might drink a little milk.   Her son notes no large change in her daily habits. She still walks down to her brother or sister's house occasionally. She moves about the house as she has before as well. She feels no more fatigued today than she normally does.     REVIEW OF SYSTEMS:  Review of Systems  Constitutional: Positive for appetite change and fatigue. Negative for unexpected weight change.  HENT:  Negative.   Eyes: Negative.   Respiratory: Negative.   Cardiovascular: Negative.   Gastrointestinal: Positive for constipation and diarrhea.  Endocrine: Negative.   Genitourinary: Negative.         Dark colored urine, s/p antibiotic  Musculoskeletal: Negative.   Skin: Negative.   Neurological: Negative.   Hematological: Negative.   Psychiatric/Behavioral: Negative.   All other systems reviewed and are negative.   PAST MEDICAL/SURGICAL HISTORY:  Past Medical History:  Diagnosis Date   Anemia due to antineoplastic chemotherapy 09/12/2015   Started Aranesp 500 mcg on 09/12/2015   Anxiety    Chronic diarrhea    Depression    DNR (do not resuscitate) 08/16/2015   Erosive esophagitis    Family history of breast cancer in female    Family history of prostate cancer    GERD (gastroesophageal reflux disease)    Hx of adenomatous colonic polyps  tubular adenomas, last found in 2008   Hyperplastic colon polyp 03/19/10   tcs by Dr. Gala Romney   Hypertension 20 years    Kidney stone    hx/ crushed    Opioid contract exists 04/18/2015   With Dr. Moshe Cipro   Pancreatic cancer [facility]) 02/2014   Pancreatic cancer metastasized to liver ([place]) 03/10/2014   Schatzki's ring 08/26/10   Last dilated on EGD by Dr. Trevor Iha [facility], linear gastric erosions, BI hemigastrectomy   Past Surgical History:  Procedure Laterality Date   BREAST LUMPECTOMY Left    bunion removal     from both [place]  03/19/2010     DR Gala Romney,, normal TI, pancolonic diverticula, random colon bx neg., hyperplastic polyps removed   ESOPHAGOGASTRODUODENOSCOPY  11/22/2003   DR Gala Romney, erosive RE, Billroth I   ESOPHAGOGASTRODUODENOSCOPY  08/26/10   Dr. Gala Romney- moderate severe ERE, Scahtzki ring s/p dilation, Billroth I, linear gastric erosions, bx-gastric xanthelasma   LEFT SHOULDER SURGERY  2009   DR HARRISON   ORIF ANKLE FRACTURE Right 05/22/2013   Procedure: OPEN REDUCTION INTERNAL FIXATION (ORIF) RIGHT ANKLE FRACTURE;  Surgeon: Sanjuana Kava, MD;  Location: [facility];  Service: Orthopedics;  Laterality: Right;   stomach ulcer  50 years ago    had some of her stomach removed     SOCIAL HISTORY:  Social History   Socioeconomic History   Marital status: Widowed    Spouse name: Not on file   Number of children: 2   Years of education: 2   Highest education level: 12th grade  Occupational History   Occupation: retired from Estate manager/land agent: RETIRED  Tobacco Use   Smoking status: Current Some Day Smoker    Packs/day: 0.50    Years: 60.00    Pack years: 30.00    Types: Cigarettes    Last attempt to quit: 09/09/2016    Years since quitting: 3.2   Smokeless tobacco: Never Used  Vaping Use   Vaping Use: Never used  Substance and Sexual Activity   Alcohol use: No   Drug use: No   Sexual activity: Not Currently  Other Topics Concern   Not on file  Social History Narrative   Lives with son alone    Social Determinants of Health   Financial Resource Strain:    Difficulty of Paying Living Expenses:   Food Insecurity:    Worried About Charity fundraiser in the Last Year:    Arboriculturist in the Last Year:   Transportation Needs:    Film/video editor (Medical):    Lack of Transportation (Non-Medical):   Physical Activity:    Days of Exercise per Week:    Minutes of Exercise per Session:   Stress:    Feeling of Stress :   Social Connections:    Frequency of Communication  with Friends and Family:    Frequency of Social Gatherings with Friends and Family:    Attends Religious Services:    Active Member of Clubs or Organizations:    Attends Music therapist:    Marital Status:   Intimate Partner Violence:    Fear of Current or Ex-Partner:    Emotionally Abused:    Physically Abused:    Sexually Abused:     FAMILY HISTORY:  Family History  Problem Relation Age of Onset   Hypertension Mother    Kidney failure Brother  on dialysis   Diabetes Sister    Diabetes Sister    Hypertension Father    Kidney failure Sister    Kidney Stones Brother    Breast cancer Son 18   Gout Son 7   Gout Nephew 17   Liver disease Neg Hx    Colon cancer Neg Hx     CURRENT MEDICATIONS:  Current Outpatient Medications  Medication Sig Dispense Refill   acetaminophen (TYLENOL) 500 MG tablet Take 500 mg by mouth every 6 (six) hours as needed for mild pain or moderate pain.      amLODipine (NORVASC) 10 MG tablet TAKE 1 TABLET DAILY 90 tablet 3   CISPLATIN IV Inject into the vein. Days 1, 8 every 21 days     clobetasol (TEMOVATE) 0.05 % external solution APP AA ON SCALP BID PRN NOT TO FACE GROIN OR UNDERARMS     cycloSPORINE (RESTASIS) 0.05 % ophthalmic emulsion Place 1 drop into both eyes 2 (two) times daily.     ergocalciferol (VITAMIN D2) 1.25 MG (50000 UT) capsule Take 1 capsule (50,000 Units total) by mouth once a week. 4 capsule 3   fluorometholone (FML) 0.1 % ophthalmic suspension INSTILL 1 DROP INTO EACH EYE THREE TIMES DAILY FOR 14 DAYS  2   fluticasone (CUTIVATE) 0.05 % cream APPLY TO THE AFFECTED AREA FACE TWICE DAILY AS NEEDED     fluticasone (FLONASE) 50 MCG/ACT nasal spray Place 1 spray into both nostrils daily.     GEMCITABINE HCL IV Inject into the vein. Days 1, 8 every 21 days     lidocaine (XYLOCAINE) 2 % solution RINSE WITH 5ML AS NEEDED TO RELIEVE MOUTH PAIN.     lidocaine-prilocaine (EMLA) cream Apply  1 application topically as needed. Apply to portacath site as needed 30 g 1   loperamide (IMODIUM A-D) 2 MG tablet Take 2 mg by mouth 4 (four) times daily as needed for diarrhea or loose stools.      metoprolol tartrate (LOPRESSOR) 25 MG tablet TAKE 1 TABLET DAILY FOR BLOOD PRESSURE (DOSE REDUCTION) 90 tablet 3   mirtazapine (REMERON) 15 MG tablet Take 15 mg by mouth at bedtime.     MYRBETRIQ 50 MG TB24 tablet Take 50 mg by mouth daily.     nitrofurantoin, macrocrystal-monohydrate, (MACROBID) 100 MG capsule Take 1 capsule (100 mg total) by mouth 2 (two) times daily. 10 capsule 0   oxybutynin (DITROPAN-XL) 10 MG 24 hr tablet TAKE 1 TABLET(5 MG) BY MOUTH AT BEDTIME 30 tablet 6   pantoprazole (PROTONIX) 20 MG tablet TAKE 1 TABLET DAILY 90 tablet 3   traMADol (ULTRAM) 50 MG tablet Take 1 tablet (50 mg total) by mouth 3 (three) times daily. 30 tablet 2   No current facility-administered medications for this visit.    ALLERGIES:  Allergies  Allergen Reactions   Iohexol Hives and Shortness Of Breath    **Patient can not have IV contrast, pt has breakthrough reaction even after 13 hour premeds** Do not give IV contrast PATIENT HAD TO BE TAKEN TO ED, C/O WHELPS, HIVES, DIFFICULTY BREATHING. PATIENT GIVEN IV CONTRAST AFTER 13 HOUR PRE MEDS ON 01/09/2017 WITH NO REACTION NOTED. On 10/19/17 patient had reaction despite premedication.     Aciphex [Rabeprazole Sodium] Other (See Comments)    unknown   Amlodipine Besylate-Valsartan     Rash to high-dose (5/320)   Esomeprazole Magnesium    Omeprazole Other (See Comments)    Patient states "medication didn't work"   Ciprofloxacin Rash  Penicillins Swelling and Rash    Has patient had a PCN reaction causing immediate rash, facial/tongue/throat swelling, SOB or lightheadedness with hypotension: Yes Has patient had a PCN reaction causing severe rash involving mucus membranes or skin necrosis: Yes Has patient had a PCN reaction that required  hospitalization: No Has patient had a PCN reaction occurring within the last 10 years: yes If all of the above answers are "NO", then may proceed with Cephalosporin use.    Tessalon Perles [Benzonatate] Itching    Full head, face, neck, and chest itching.     PHYSICAL EXAM:  Performance status (ECOG): 1 - Symptomatic but completely ambulatory  Vitals:   12/08/19 0817  BP: (!) 139/62  Pulse: 66  Resp: 17  Temp: (!) 97.1 F (36.2 C)  SpO2: 100%   Wt Readings from Last 3 Encounters:  12/08/19 (!) 93 lb (42.2 kg)  11/24/19 92 lb 4.8 oz (41.9 kg)  11/22/19 93 lb 0.6 oz (42.2 kg)   Physical Exam Vitals and nursing note reviewed.  Constitutional:      General: She is not in acute distress.    Appearance: She is underweight.     Interventions: Face mask in place.  HENT:     Mouth/Throat:     Mouth: Mucous membranes are moist.  Eyes:     Pupils: Pupils are equal, round, and reactive to light.  Cardiovascular:     Rate and Rhythm: Normal rate and regular rhythm.     Pulses: Normal pulses.     Heart sounds: Normal heart sounds.  Pulmonary:     Effort: Pulmonary effort is normal.     Breath sounds: Normal breath sounds.  Abdominal:     Palpations: Abdomen is soft. There is no mass.     Tenderness: There is no abdominal tenderness.  Musculoskeletal:     Cervical back: Neck supple.     Right lower leg: No edema.     Left lower leg: No edema.  Neurological:     Mental Status: She is alert and oriented to person, place, and time.  Psychiatric:        Mood and Affect: Mood normal.        Behavior: Behavior normal.     LABORATORY DATA:  I have reviewed the labs as listed.  CBC Latest Ref Rng & Units 11/24/2019 11/10/2019 10/27/2019  WBC 4.0 - 10.5 K/uL 7.5 4.0 5.6  Hemoglobin 12.0 - 15.0 g/dL 9.9(L) 8.4(L) 9.5(L)  Hematocrit 36 - 46 % 29.3(L) 25.4(L) 28.3(L)  Platelets 150 - 400 K/uL 155 106(L) 159   CMP Latest Ref Rng & Units 11/24/2019 11/10/2019 10/27/2019  Glucose 70 -  99 mg/dL 107(H) 98 115(H)  BUN 8 - 23 mg/dL 19 21 31(H)  Creatinine 0.44 - 1.00 mg/dL 1.21(H) 1.80(H) 1.69(H)  Sodium 135 - 145 mmol/L 135 134(L) 138  Potassium 3.5 - 5.1 mmol/L 3.0(L) 3.5 3.5  Chloride 98 - 111 mmol/L 98 93(L) 103  CO2 22 - 32 mmol/L _0 Calcium 8.9 - 10.3 mg/dL 8.3(L) 8.1(L) 8.3(L)  Total Protein 6.5 - 8.1 g/dL 7.2 6.7 7.6  Total Bilirubin 0.3 - 1.2 mg/dL 2.5(H) 1.0 0.7  Alkaline Phos 38 - 126 U/L 221(H) 188(H) 293(H)  AST 15 - 41 U/L 132(H) 24 87(H)  ALT 0 - 44 U/L 91(H) 21 48(H)    DIAGNOSTIC IMAGING:  I have independently reviewed the scans and discussed with the patient. No results found.    ASSESSMENT:  1.  Metastatic pancreatic cancer to the liver: -Gemcitabine and Abraxane from November 2015 through August 2018 with progression. -Gemcitabine and Tarceva from 01/28/2017 through 10/07/2017 with progression. -21 cycles of infusional 5-FU and Onivyde from 10/21/2017 through 09/08/2018. -7 cycles of FOLFOX from 09/22/2018 through 12/29/2018. -Gemcitabine and cisplatin day 1 and day 15 started on 02/10/2019. -CT CAP on 09/16/2019 showed mass in the posterior liver dome measuring 4.6 x 3.6 cm compared to 4 x 3.2 cm on 06/01/2019. Left lobe mass also improved with 3 mm. No new hepatic lesions. Study limited by lack of IV contrast. No new areas of metastatic disease.   PLAN:  1. Metastatic pancreatic cancer to the liver: -Last treatment on 10/27/2019. -Labs reviewed today.  Total bilirubin is 7.9 with direct bilirubin of 4.8.  Likely from metastatic disease to the liver.  However will do MRI/MRCP to evaluate for any obstruction. -We will continue to hold chemotherapy.  I have discussed the poor prognosis with the patient and her son. -If there is no correctable causes for elevated bilirubin, will consider palliative care with hospice.  2. Hypomagnesemia: -Magnesium today is 1.5.  She will receive supplements.  3. Left lateral chest wall pain: -Pain is  well controlled with tramadol 50 mg twice daily.  4. Overactive bladder: -Continue oxybutynin 10 mg daily.  5. Weight loss: -Continue Remeron 15 mg at bedtime.  She gained half a pound.  6. Vitamin D deficiency: -Continue vitamin D supplements.  7.  UTI: -She completed antibiotic.  However she complains of dark urine.  Will check UA.   Orders placed this encounter:  No orders of the defined types were placed in this encounter.    Derek Jack, MD [facility] [phone]   I, Jacqualyn Posey, am acting as a scribe for Dr. Sanda Linger.  I, Derek Jack MD, have reviewed the above documentation for accuracy and completeness, and I agree with the above.

## 2019-12-08 NOTE — Patient Instructions (Signed)
[facility] Discharge Instructions  You were seen today by Dr. Katragadda. He went over your recent results. Dr. Katragadda will see you back in for labs and follow up.   Thank you for choosing  [facility] at [facility] to provide your oncology and hematology care.  To afford each patient quality time with our provider, please arrive at least 15 minutes before your scheduled appointment time.   If you have a lab appointment with the Cancer Center please come in thru the Main Entrance and check in at the main information desk  You need to re-schedule your appointment should you arrive 10 or more minutes late.  We strive to give you quality time with our providers, and arriving late affects you and other patients whose appointments are after yours.  Also, if you no show three or more times for appointments you may be dismissed from the clinic at the providers discretion.     Again, thank you for choosing [facility].  Our hope is that these requests will decrease the amount of time that you wait before being seen by our physicians.       _____________________________________________________________  Should you have questions after your visit to [facility], please contact our office at [phone] between the hours of 8:00 a.m. and 4:30 p.m.  Voicemails left after 4:00 p.m. will not be returned until the following business day.  For prescription refill requests, have your pharmacy contact our office and allow 72 hours.    Cancer Center Support Programs:   > Cancer Support Group  2nd Tuesday of the month 1pm-2pm, Journey Room    

## 2019-12-08 NOTE — Progress Notes (Signed)
Critical value alert  Potassium 2.6  Patient was seen by Dr. Delton Coombes and orders were given to primary RN

## 2019-12-09 ENCOUNTER — Inpatient Hospital Stay (HOSPITAL_COMMUNITY): Payer: Medicare HMO

## 2019-12-09 LAB — CANCER ANTIGEN 19-9: CA 19-9: 2566 U/mL — ABNORMAL HIGH (ref 0–35)

## 2019-12-09 LAB — URINE CULTURE

## 2019-12-12 ENCOUNTER — Other Ambulatory Visit: Payer: Self-pay | Admitting: Family Medicine

## 2019-12-12 MED ORDER — CLONAZEPAM 0.5 MG PO TABS
0.5000 mg | ORAL_TABLET | Freq: Every day | ORAL | 1 refills | Status: AC
Start: 2019-12-12 — End: ?

## 2019-12-14 ENCOUNTER — Other Ambulatory Visit (HOSPITAL_COMMUNITY): Payer: Self-pay

## 2019-12-14 ENCOUNTER — Other Ambulatory Visit (HOSPITAL_COMMUNITY): Payer: Self-pay | Admitting: Nurse Practitioner

## 2019-12-14 ENCOUNTER — Telehealth (HOSPITAL_COMMUNITY): Payer: Self-pay | Admitting: *Deleted

## 2019-12-14 DIAGNOSIS — C787 Secondary malignant neoplasm of liver and intrahepatic bile duct: Secondary | ICD-10-CM

## 2019-12-14 DIAGNOSIS — D6481 Anemia due to antineoplastic chemotherapy: Secondary | ICD-10-CM

## 2019-12-14 DIAGNOSIS — T451X5A Adverse effect of antineoplastic and immunosuppressive drugs, initial encounter: Secondary | ICD-10-CM

## 2019-12-14 DIAGNOSIS — C259 Malignant neoplasm of pancreas, unspecified: Secondary | ICD-10-CM

## 2019-12-14 MED ORDER — HYDROCODONE-ACETAMINOPHEN 5-325 MG PO TABS: 1.0000 | ORAL_TABLET | Freq: Three times a day (TID) | ORAL | 0 refills | Status: AC | PRN

## 2019-12-14 NOTE — Telephone Encounter (Signed)
Pt's son Barbaraann Rondo called into clinic stating pt was in a lot of pain. He also stated pt is currently taking Tramadol and it's not working.I spoke with Shyrl Numbers and she prescribed hydrocodone 5 mg every eight hours as needed. Pt's son also wanted to know when pt's MRI is scheduled. I called pt's son back and told him what NP prescribed and that the MRI is scheduled for 12/16/2019 at 10 a.m at [facility]. Son verbalized understanding.

## 2019-12-16 ENCOUNTER — Ambulatory Visit (HOSPITAL_COMMUNITY)
Admission: RE | Admit: 2019-12-16 | Discharge: 2019-12-16 | Disposition: A | Discharge status: Home / Self Care | Payer: Medicare HMO | Source: Ambulatory Visit | Attending: Hematology | Admitting: Hematology

## 2019-12-16 ENCOUNTER — Other Ambulatory Visit (HOSPITAL_COMMUNITY): Payer: Self-pay | Admitting: Hematology

## 2019-12-16 ENCOUNTER — Other Ambulatory Visit: Payer: Self-pay

## 2019-12-16 DIAGNOSIS — C787 Secondary malignant neoplasm of liver and intrahepatic bile duct: Secondary | ICD-10-CM

## 2019-12-16 DIAGNOSIS — D6481 Anemia due to antineoplastic chemotherapy: Secondary | ICD-10-CM | POA: Diagnosis present

## 2019-12-16 DIAGNOSIS — T451X5A Adverse effect of antineoplastic and immunosuppressive drugs, initial encounter: Secondary | ICD-10-CM | POA: Insufficient documentation

## 2019-12-16 DIAGNOSIS — C259 Malignant neoplasm of pancreas, unspecified: Secondary | ICD-10-CM

## 2019-12-16 MED ORDER — GADOBUTROL 1 MMOL/ML IV SOLN
4.0000 mL | Freq: Once | INTRAVENOUS | Status: AC | PRN
  Administered 2019-12-16: 4 mL via INTRAVENOUS

## 2019-12-19 ENCOUNTER — Other Ambulatory Visit: Payer: Self-pay

## 2019-12-19 ENCOUNTER — Encounter: Payer: Self-pay | Admitting: Gastroenterology

## 2019-12-19 DIAGNOSIS — C259 Malignant neoplasm of pancreas, unspecified: Secondary | ICD-10-CM

## 2019-12-19 DIAGNOSIS — R17 Unspecified jaundice: Secondary | ICD-10-CM

## 2019-12-19 NOTE — Progress Notes (Signed)
Asked by Dr. Laural Golden to evaluate this patient's MRI/MRCP. Patient with known metastatic pancreatic cancer to the liver. Patient bilirubin levels have been increasing. MRI imaging shows significant disease burden in the confluence/bifurcation region.  There is significant left-sided biliary ductal dilation. Some right-sided ductal dilation is also present but I suspect the majority of her disease burden is currently on the left side. Certainly an attempt at drainage of the left system and potentially the right is reasonable. It is felt that this patient should be done in the tertiary center at [facility] or [facility]. I am happy to be available if we can arrange it for this week on Friday. Otherwise unless she is admitted to the hospital I do not have any availability in the course of the next 3 to 4 weeks for an outpatient procedure.  I will forward this to move forward with plan for ERCP attempt on Friday (8 AM) okay at [facility] or [facility] which ever has availability. I think the patient should have repeat CBC/CMP/INR performed prior to her procedure (Tuesday or Wednesday) so that we can see how the labs look and what her degree of obstruction is currently.  RN Gerarda Fraction, please move forward with reaching out to the patient and let her know that we are happy to try and be of assistance and hopefully we can arrange it for this week.  FYI Dr. Delton Coombes and Dr. Laural Golden.  Thanks. GM

## 2019-12-19 NOTE — Progress Notes (Signed)
I spoke with the pt and a family member.  I provided instructions for ERCP, labs and COVID test.  Questions were answered and they were advised to call with any further concerns.  The pt has been advised of the information and verbalized understanding.

## 2019-12-19 NOTE — Progress Notes (Signed)
Thanks for your help.

## 2019-12-19 NOTE — Progress Notes (Signed)
ERCP scheduled for 12/23/19 at 845 am at [facility] with Dr Rush Landmark COVID test on 12/20/19 labs as well to be done on 8/10.  Order entered.

## 2019-12-20 ENCOUNTER — Other Ambulatory Visit (INDEPENDENT_AMBULATORY_CARE_PROVIDER_SITE_OTHER): Payer: Medicare HMO

## 2019-12-20 ENCOUNTER — Other Ambulatory Visit (HOSPITAL_COMMUNITY)
Admission: RE | Admit: 2019-12-20 | Discharge: 2019-12-20 | Disposition: A | Discharge status: Home / Self Care | Payer: Medicare HMO | Source: Ambulatory Visit | Attending: Gastroenterology | Admitting: Gastroenterology

## 2019-12-20 DIAGNOSIS — C259 Malignant neoplasm of pancreas, unspecified: Secondary | ICD-10-CM | POA: Diagnosis not present

## 2019-12-20 DIAGNOSIS — C787 Secondary malignant neoplasm of liver and intrahepatic bile duct: Secondary | ICD-10-CM

## 2019-12-20 DIAGNOSIS — R17 Unspecified jaundice: Secondary | ICD-10-CM

## 2019-12-20 DIAGNOSIS — Z01812 Encounter for preprocedural laboratory examination: Secondary | ICD-10-CM | POA: Insufficient documentation

## 2019-12-20 DIAGNOSIS — Z20822 Contact with and (suspected) exposure to covid-19: Secondary | ICD-10-CM | POA: Insufficient documentation

## 2019-12-20 LAB — COMPREHENSIVE METABOLIC PANEL
ALT: 53 U/L — ABNORMAL HIGH (ref 0–35)
AST: 81 U/L — ABNORMAL HIGH (ref 0–37)
Albumin: 3 g/dL — ABNORMAL LOW (ref 3.5–5.2)
Alkaline Phosphatase: 250 U/L — ABNORMAL HIGH (ref 39–117)
BUN: 27 mg/dL — ABNORMAL HIGH (ref 6–23)
CO2: 25 mEq/L (ref 19–32)
Calcium: 8.8 mg/dL (ref 8.4–10.5)
Chloride: 99 mEq/L (ref 96–112)
Creatinine, Ser: 1.59 mg/dL — ABNORMAL HIGH (ref 0.40–1.20)
GFR: 36.99 mL/min — ABNORMAL LOW (ref >60.00)
Glucose, Bld: 128 mg/dL — ABNORMAL HIGH (ref 70–99)
Potassium: 4.3 mEq/L (ref 3.5–5.1)
Sodium: 136 mEq/L (ref 135–145)
Total Bilirubin: 11.5 mg/dL — ABNORMAL HIGH (ref 0.2–1.2)
Total Protein: 7.7 g/dL (ref 6.0–8.3)

## 2019-12-20 LAB — CBC WITH DIFFERENTIAL/PLATELET
Basophils Absolute: 0.1 10*3/uL (ref 0.0–0.1)
Basophils Relative: 0.9 % (ref 0.0–3.0)
Eosinophils Absolute: 0 10*3/uL (ref 0.0–0.7)
Eosinophils Relative: 0.5 % (ref 0.0–5.0)
HCT: 31.4 % — ABNORMAL LOW (ref 36.0–46.0)
Hemoglobin: 10.9 g/dL — ABNORMAL LOW (ref 12.0–15.0)
Lymphocytes Relative: 17.7 % (ref 12.0–46.0)
Lymphs Abs: 1.5 10*3/uL (ref 0.7–4.0)
MCHC: 34.8 g/dL (ref 30.0–36.0)
MCV: 98.8 fl (ref 78.0–100.0)
Monocytes Absolute: 0.8 10*3/uL (ref 0.1–1.0)
Monocytes Relative: 8.8 % (ref 3.0–12.0)
Neutro Abs: 6.2 10*3/uL (ref 1.4–7.7)
Neutrophils Relative %: 72.1 % (ref 43.0–77.0)
Platelets: 153 10*3/uL (ref 150.0–400.0)
RBC: 3.18 Mil/uL — ABNORMAL LOW (ref 3.87–5.11)
RDW: 17.2 % — ABNORMAL HIGH (ref 11.5–15.5)
WBC: 8.5 10*3/uL (ref 4.0–10.5)

## 2019-12-20 LAB — PROTIME-INR
INR: 1.4 ratio — ABNORMAL HIGH (ref 0.8–1.0)
Prothrombin Time: 15.3 s — ABNORMAL HIGH (ref 9.6–13.1)

## 2019-12-20 LAB — SARS CORONAVIRUS 2 (TAT 6-24 HRS): SARS Coronavirus 2: NEGATIVE

## 2019-12-20 NOTE — Progress Notes (Signed)
No problem. I will do my best to try and help her out. Thanks. GM

## 2019-12-22 ENCOUNTER — Ambulatory Visit (HOSPITAL_COMMUNITY): Payer: Medicare HMO

## 2019-12-22 ENCOUNTER — Other Ambulatory Visit: Payer: Self-pay

## 2019-12-22 ENCOUNTER — Inpatient Hospital Stay (HOSPITAL_COMMUNITY)

## 2019-12-22 ENCOUNTER — Inpatient Hospital Stay (HOSPITAL_COMMUNITY): Attending: Hematology

## 2019-12-22 ENCOUNTER — Inpatient Hospital Stay (HOSPITAL_BASED_OUTPATIENT_CLINIC_OR_DEPARTMENT_OTHER): Admitting: Hematology

## 2019-12-22 VITALS — BP 128/58 | HR 66 | Temp 97.0°F | Resp 16 | Wt 82.3 lb

## 2019-12-22 DIAGNOSIS — R634 Abnormal weight loss: Secondary | ICD-10-CM | POA: Insufficient documentation

## 2019-12-22 DIAGNOSIS — C252 Malignant neoplasm of tail of pancreas: Secondary | ICD-10-CM | POA: Insufficient documentation

## 2019-12-22 DIAGNOSIS — C787 Secondary malignant neoplasm of liver and intrahepatic bile duct: Secondary | ICD-10-CM

## 2019-12-22 DIAGNOSIS — C259 Malignant neoplasm of pancreas, unspecified: Secondary | ICD-10-CM

## 2019-12-22 DIAGNOSIS — D6481 Anemia due to antineoplastic chemotherapy: Secondary | ICD-10-CM

## 2019-12-22 DIAGNOSIS — T451X5A Adverse effect of antineoplastic and immunosuppressive drugs, initial encounter: Secondary | ICD-10-CM

## 2019-12-22 LAB — COMPREHENSIVE METABOLIC PANEL
ALT: 58 U/L — ABNORMAL HIGH (ref 0–44)
AST: 97 U/L — ABNORMAL HIGH (ref 15–41)
Albumin: 2.6 g/dL — ABNORMAL LOW (ref 3.5–5.0)
Alkaline Phosphatase: 257 U/L — ABNORMAL HIGH (ref 38–126)
Anion gap: 13 (ref 5–15)
BUN: 32 mg/dL — ABNORMAL HIGH (ref 8–23)
CO2: 23 mmol/L (ref 22–32)
Calcium: 8.5 mg/dL — ABNORMAL LOW (ref 8.9–10.3)
Chloride: 100 mmol/L (ref 98–111)
Creatinine, Ser: 1.41 mg/dL — ABNORMAL HIGH (ref 0.44–1.00)
GFR calc Af Amer: 38 mL/min — ABNORMAL LOW (ref >60)
GFR calc non Af Amer: 33 mL/min — ABNORMAL LOW (ref >60)
Glucose, Bld: 108 mg/dL — ABNORMAL HIGH (ref 70–99)
Potassium: 4.6 mmol/L (ref 3.5–5.1)
Sodium: 136 mmol/L (ref 135–145)
Total Bilirubin: 12.4 mg/dL — ABNORMAL HIGH (ref 0.3–1.2)
Total Protein: 7.6 g/dL (ref 6.5–8.1)

## 2019-12-22 LAB — CBC WITH DIFFERENTIAL/PLATELET
Abs Immature Granulocytes: 0.03 10*3/uL (ref 0.00–0.07)
Basophils Absolute: 0 10*3/uL (ref 0.0–0.1)
Basophils Relative: 0 %
Eosinophils Absolute: 0 10*3/uL (ref 0.0–0.5)
Eosinophils Relative: 0 %
HCT: 30.2 % — ABNORMAL LOW (ref 36.0–46.0)
Hemoglobin: 10.3 g/dL — ABNORMAL LOW (ref 12.0–15.0)
Immature Granulocytes: 0 %
Lymphocytes Relative: 10 %
Lymphs Abs: 0.9 10*3/uL (ref 0.7–4.0)
MCH: 33.3 pg (ref 26.0–34.0)
MCHC: 34.1 g/dL (ref 30.0–36.0)
MCV: 97.7 fL (ref 80.0–100.0)
Monocytes Absolute: 0.5 10*3/uL (ref 0.1–1.0)
Monocytes Relative: 5 %
Neutro Abs: 8.1 10*3/uL — ABNORMAL HIGH (ref 1.7–7.7)
Neutrophils Relative %: 85 %
Platelets: 157 10*3/uL (ref 150–400)
RBC: 3.09 MIL/uL — ABNORMAL LOW (ref 3.87–5.11)
RDW: 18.6 % — ABNORMAL HIGH (ref 11.5–15.5)
WBC: 9.6 10*3/uL (ref 4.0–10.5)
nRBC: 0 % (ref 0.0–0.2)

## 2019-12-22 LAB — MAGNESIUM: Magnesium: 2 mg/dL (ref 1.7–2.4)

## 2019-12-22 MED ORDER — SODIUM CHLORIDE 0.9% FLUSH: 10.0000 mL | INTRAVENOUS | Status: DC | PRN

## 2019-12-22 MED ORDER — LIDOCAINE 5 % EX PTCH
1.0000 | MEDICATED_PATCH | CUTANEOUS | 1 refills | Status: DC
Start: 2019-12-22 — End: 2020-01-02

## 2019-12-22 MED ORDER — HEPARIN SOD (PORK) LOCK FLUSH 100 UNIT/ML IV SOLN
500.0000 [IU] | Freq: Once | INTRAVENOUS | Status: AC | PRN
  Administered 2019-12-22: 500 [IU]

## 2019-12-22 MED ORDER — HEPARIN SOD (PORK) LOCK FLUSH 100 UNIT/ML IV SOLN: 500.0000 [IU] | Freq: Once | INTRAVENOUS | Status: DC

## 2019-12-22 MED ORDER — LIDOCAINE 5 % EX PTCH
1.0000 | MEDICATED_PATCH | Freq: Once | CUTANEOUS | Status: DC
  Administered 2019-12-22: 1 via TRANSDERMAL
  Filled 2019-12-22: qty 1

## 2019-12-22 MED ORDER — SODIUM CHLORIDE 0.9% FLUSH
10.0000 mL | Freq: Once | INTRAVENOUS | Status: AC | PRN
  Administered 2019-12-22: 10 mL

## 2019-12-22 MED ORDER — SODIUM CHLORIDE 0.9 % IV SOLN
Freq: Once | INTRAVENOUS | Status: AC
  Filled 2019-12-22: qty 1000

## 2019-12-22 NOTE — Patient Instructions (Signed)
[place] at [facility] Discharge Instructions  You were seen today by Dr. Delton Coombes. He went over your recent results. You will be prescribed lidocaine patches to apply on the area that hurts the most; apply the patch for 12 hours on, then 12 hours off. You will be referred to palliative care. Dr. Delton Coombes will see you back in 3 weeks for labs and follow up.   Thank you for choosing [place] at [facility] to provide your oncology and hematology care.  To afford each patient quality time with our provider, please arrive at least 15 minutes before your scheduled appointment time.   If you have a lab appointment with the [place] please come in thru the Main Entrance and check in at the main information desk  You need to re-schedule your appointment should you arrive 10 or more minutes late.  We strive to give you quality time with our providers, and arriving late affects you and other patients whose appointments are after yours.  Also, if you no show three or more times for appointments you may be dismissed from the clinic at the providers discretion.     Again, thank you for choosing [facility].  Our hope is that these requests will decrease the amount of time that you wait before being seen by our physicians.       _____________________________________________________________  Should you have questions after your visit to [facility], please contact our office at [phone] between the hours of 8:00 a.m. and 4:30 p.m.  Voicemails left after 4:00 p.m. will not be returned until the following business day.  For prescription refill requests, have your pharmacy contact our office and allow 72 hours.    Cancer Center Support Programs:   > Cancer Support Group  2nd Tuesday of the month 1pm-2pm, Journey Room

## 2019-12-22 NOTE — Progress Notes (Signed)
Pre op call done for endo procedure tomorrow 12/23/19. Patient states she has been quarantined since covid test, will be NPO after midnight, and states she has a ride home post procedure. All questions addressed.

## 2019-12-22 NOTE — Progress Notes (Signed)
[name] presents today for MD follow up. Pt has lost 11 pounds since last visit and reports no appetite. Per MD orders, we will add patient on for IV fluids with Potassium and Magnesium today. Order also given to apply Lidoderm patch to lower back. Labs and vitals reviewed, proceeding with received orders.

## 2019-12-22 NOTE — Progress Notes (Signed)
[name], [place] [address]   CLINIC:  Medical Oncology/Hematology  PCP:  Fayrene Helper, MD [address] [place] / [place] [address] [phone]   REASON FOR VISIT:  Follow-up for metastatic pancreatic cancer to the liver.  PRIOR THERAPY:  1. Gemcitabine and Abraxane from 03/2014 to 12/2016. 2. Gemcitabine and Tarceva from 01/28/2017 to 10/07/2017. 3. Infusional 5-FU and Onivyde x 21 cycles from 10/21/2017 to 09/08/2018. 4. FOLFOX x 7 cycles from 09/22/2018 to 12/29/2018. 5. Cisplatin, gemcitabine and Aloxi from 02/10/2019 to 10/27/2019.  NGS Results: Foundation 1 MS--stable, TMB cannot be determined, K-RAS G 12 V, T p53 mutation  CURRENT THERAPY: Observation  BRIEF ONCOLOGIC HISTORY:  Oncology History  Pancreatic cancer metastasized to liver ([place])  03/10/2014 Initial Diagnosis   Pancreatic cancer metastasized to liver   03/22/2014 - 03/05/2016 Chemotherapy   Abraxane/Gemzar days 1, 8, every 28 days.  Day 15 was cancelled due to leukopenia and thrombocytopenia on day 15 cycle 1.   05/24/2014 Treatment Plan Change   Day 8 of cycle 3 is held with ANC of 1.1   06/05/2014 Imaging   CT C/A/P . Interval decrease in size of the pancreatic tail mass.Improved hepatic metastatic disease. No new lesions. No CT findings for metastatic disease involving the chest.   08/09/2014 Tumor Marker   CA 19-9= 33 (WNL)   10/26/2014 Imaging   MRI- Continued interval decrease in size of the hepatic metastatic lesions and no new lesions are identified. Continued decrease in size of the pancreatic tail lesion.   01/03/2015 Tumor Marker   Results for [name], [name] (MRN [id number]) as of 01/18/2015 12:52  01/03/2015 10:00 CA 19-9: 14    01/17/2015 Imaging   MRI- Response to therapy of hepatic metastasis.  Similar size of a pancreatic tail lesion.   03/20/2015 Imaging   MRI-L spine- Severe disc space narrowing at L2-L3, with endplate reactive  changes. Large disc extrusion into the ventral epidural space,central to the RIGHT with a cephalad migrated free fragment. Additional Large disc extrusion into the retroperitone...   03/22/2015 Imaging   CT pelvis- No evidence for metastatic disease within the pelvis.   07/24/2015 Imaging   MRI abd- Continued response to therapy, with no residual detectable liver metastases. No new sites of metastatic disease in the abdomen.    08/15/2015 Code Status    She confirms desire for DNR status.   12/21/2015 Imaging   MRI liver- Severe image degradation due to motion artifact reducing diagnostic sensitivity and specificity. Reduce conspicuity of the pancreatic tail lesion suggesting further improvement. The original liver lesions have resolved.   03/05/2016 Treatment Plan Change   Chemotherapy holiday/Break after 2 years worth of treatment   04/07/2016 Imaging   CT chest- When compared to recent chest CT, new minimally displaced anterior left sixth rib fracture. Slight increase in subcarinal adenopathy   04/28/2016 Imaging   Bone density- BMD as determined from Femur Neck Right is 0.705 g/cm2 with a T-Score of -2.4. This patient is considered osteopenic according to [facility]) criteria. Compared with the prior study on 02/23/2013, the BMD of the lumbar spine/rt. femoral neck show a statistically significant decrease.    05/23/2016 Imaging   MRI abd- 1. Exam is significantly degraded by patient respiratory motion. Consider follow-up exams with CT abdomen with without contrast per pancreatic protocol. 2. Fullness in tail of pancreas is more prominent and potentially increased in size. Recommend close attention on follow-up. (  Consider CT as above) 3. No explanation for back pain.   09/11/2016 Imaging   MRI pancreas- Interval progression of pancreatic tail lesion. New differential perfusion right liver with areas of heterogeneity. Underlying metastatic disease in this  region not excluded.   09/11/2016 Progression   MRi in conjunction with rising CA 19-9 are indicative of relapse of disease.   01/09/2017 Progression   CT C/A/P: 2.1 x 2.9 cm lesion in the pancreatic tail and abutting the splenic hilum, corresponding to known primary pancreatic neoplasm, mildly increased.  Scattered small hypoenhancing lesions in the liver measuring up to 10 mm, approximately 8-10 in number, suspicious for hepatic metastases.  No findings specific for metastatic disease in the chest.  Additional ancillary findings as above.    01/27/2017 - 10/20/2017 Chemotherapy   The patient had gemcitabine (GEMZAR) 1,292 mg in sodium chloride 0.9 % 250 mL chemo infusion, 800 mg/m2 = 1,292 mg, Intravenous,  Once, 9 of 12 cycles Administration: 1,292 mg (01/28/2017), 1,292 mg (02/11/2017), 1,292 mg (02/25/2017), 1,292 mg (03/11/2017), 1,292 mg (03/25/2017), 1,292 mg (04/08/2017), 1,292 mg (04/22/2017), 1,292 mg (04/29/2017), 1,292 mg (05/20/2017), 1,292 mg (06/03/2017), 1,292 mg (06/24/2017), 1,292 mg (07/08/2017), 1,292 mg (07/29/2017), 1,292 mg (08/12/2017), 1,292 mg (08/26/2017), 1,292 mg (09/09/2017), 1,292 mg (09/23/2017), 1,292 mg (10/07/2017)  for chemotherapy treatment.    10/21/2017 - 09/22/2018 Chemotherapy   The patient had leucovorin 700 mg in dextrose 5 % 250 mL infusion, 644 mg, Intravenous,  Once, 22 of 22 cycles Administration: 700 mg (10/21/2017), 700 mg (11/04/2017), 700 mg (11/18/2017), 700 mg (12/02/2017), 700 mg (12/16/2017), 700 mg (12/30/2017), 700 mg (01/13/2018), 700 mg (01/27/2018), 700 mg (02/10/2018), 700 mg (02/24/2018), 700 mg (03/10/2018), 700 mg (03/24/2018), 700 mg (04/28/2018), 700 mg (05/17/2018), 700 mg (05/31/2018), 700 mg (06/16/2018), 700 mg (06/30/2018), 700 mg (07/21/2018), 700 mg (08/04/2018), 700 mg (08/25/2018), 700 mg (09/08/2018) ondansetron (ZOFRAN) 8 mg in sodium chloride 0.9 % 50 mL IVPB, , Intravenous,  Once, 21 of 21 cycles Administration:  (11/04/2017),  (01/27/2018),   (02/10/2018),  (02/24/2018),  (03/10/2018),  (03/24/2018),  (04/28/2018),  (05/17/2018),  (05/31/2018),  (06/16/2018),  (06/30/2018),  (07/21/2018),  (08/04/2018),  (08/25/2018),  (09/08/2018) fluorouracil (ADRUCIL) 3,850 mg in sodium chloride 0.9 % 73 mL chemo infusion, 2,400 mg/m2 = 3,850 mg, Intravenous, 1 Day/Dose, 22 of 22 cycles Dose modification: 1,920 mg/m2 (original dose 2,400 mg/m2, Cycle 2, Reason: Provider Judgment, Comment: mucisitis) Administration: 3,850 mg (10/21/2017), 3,100 mg (11/04/2017), 3,100 mg (11/18/2017), 3,100 mg (12/02/2017), 3,100 mg (12/16/2017), 3,100 mg (12/30/2017), 3,100 mg (01/13/2018), 3,100 mg (01/27/2018), 3,100 mg (02/10/2018), 3,100 mg (02/24/2018), 3,100 mg (03/10/2018), 3,100 mg (03/24/2018), 3,100 mg (04/28/2018), 3,100 mg (05/17/2018), 3,100 mg (05/31/2018), 3,100 mg (06/16/2018), 3,100 mg (06/30/2018), 3,100 mg (07/21/2018), 3,100 mg (08/04/2018), 3,100 mg (08/25/2018), 3,100 mg (09/08/2018) irinotecan LIPOSOME (ONIVYDE) 81.7 mg in sodium chloride 0.9 % 500 mL chemo infusion, 50 mg/m2 = 81.7 mg (100 % of original dose 50 mg/m2), Intravenous, Once, 22 of 22 cycles Dose modification: 50 mg/m2 (original dose 50 mg/m2, Cycle 1, Reason: Provider Judgment), 50 mg/m2 (original dose 50 mg/m2, Cycle 2, Reason: Provider Judgment) Administration: 81.7 mg (10/21/2017), 81.7 mg (11/04/2017), 81.7 mg (11/18/2017), 81.7 mg (12/02/2017), 81.7 mg (12/16/2017), 81.7 mg (12/30/2017), 81.7 mg (01/13/2018), 81.7 mg (01/27/2018), 81.7 mg (02/10/2018), 81.7 mg (02/24/2018), 81.7 mg (03/10/2018), 81.7 mg (03/24/2018), 81.7 mg (04/28/2018), 81.7 mg (05/17/2018), 81.7 mg (05/31/2018), 81.7 mg (06/16/2018), 81.7 mg (06/30/2018), 81.7 mg (07/21/2018), 81.7 mg (08/04/2018), 81.7 mg (08/25/2018), 81.7 mg (09/08/2018)  for chemotherapy treatment.  09/22/2018 - 01/11/2019 Chemotherapy   The patient had palonosetron (ALOXI) injection 0.25 mg, 0.25 mg, Intravenous,  Once, 7 of 8 cycles Administration: 0.25 mg (09/22/2018), 0.25 mg (10/11/2018), 0.25  mg (10/25/2018), 0.25 mg (12/01/2018), 0.25 mg (12/15/2018), 0.25 mg (11/17/2018), 0.25 mg (12/29/2018) pegfilgrastim (NEULASTA) injection 6 mg, 6 mg, Subcutaneous, Once, 5 of 5 cycles Administration: 6 mg (10/13/2018), 6 mg (10/27/2018), 6 mg (12/03/2018), 6 mg (12/17/2018), 6 mg (11/19/2018) pegfilgrastim-cbqv (UDENYCA) injection 6 mg, 6 mg, Subcutaneous, Once, 1 of 2 cycles Administration: 6 mg (12/31/2018) leucovorin 628 mg in dextrose 5 % 250 mL infusion, 400 mg/m2 = 628 mg, Intravenous,  Once, 7 of 8 cycles Administration: 628 mg (09/22/2018), 628 mg (10/11/2018), 628 mg (10/25/2018), 628 mg (12/01/2018), 628 mg (12/15/2018), 628 mg (11/17/2018), 628 mg (12/29/2018) oxaliplatin (ELOXATIN) 100 mg in dextrose 5 % 500 mL chemo infusion, 65 mg/m2 = 100 mg (100 % of original dose 65 mg/m2), Intravenous,  Once, 7 of 8 cycles Dose modification: 65 mg/m2 (original dose 65 mg/m2, Cycle 1, Reason: Provider Judgment) Administration: 100 mg (09/22/2018), 100 mg (10/11/2018), 100 mg (10/25/2018), 100 mg (12/01/2018), 100 mg (12/15/2018), 100 mg (11/17/2018), 100 mg (12/29/2018) fluorouracil (ADRUCIL) chemo injection 500 mg, 320 mg/m2 = 500 mg (100 % of original dose 320 mg/m2), Intravenous,  Once, 7 of 8 cycles Dose modification: 320 mg/m2 (original dose 320 mg/m2, Cycle 1, Reason: Provider Judgment) Administration: 500 mg (09/22/2018), 500 mg (10/11/2018), 500 mg (10/25/2018), 500 mg (12/01/2018), 500 mg (12/15/2018), 500 mg (11/17/2018), 500 mg (12/29/2018) fluorouracil (ADRUCIL) 3,000 mg in sodium chloride 0.9 % 90 mL chemo infusion, 1,920 mg/m2 = 3,000 mg (100 % of original dose 1,920 mg/m2), Intravenous, 1 Day/Dose, 7 of 8 cycles Dose modification: 1,920 mg/m2 (original dose 1,920 mg/m2, Cycle 1, Reason: Provider Judgment) Administration: 3,000 mg (09/22/2018), 3,000 mg (10/11/2018), 3,000 mg (10/25/2018), 3,000 mg (12/01/2018), 3,000 mg (12/15/2018), 3,000 mg (11/17/2018), 3,000 mg (12/29/2018)  for chemotherapy treatment.    12/24/2018 Genetic Testing     Negative genetic testing on the common hereditary cancer panel.  The Common Hereditary Gene Panel offered by [facility] includes sequencing and/or deletion duplication testing of the following 48 genes: APC, ATM, AXIN2, BARD1, BMPR1A, BRCA1, BRCA2, BRIP1, CDH1, CDK4, CDKN2A (p14ARF), CDKN2A (p16INK4a), CHEK2, CTNNA1, DICER1, EPCAM (Deletion/duplication testing only), GREM1 (promoter region deletion/duplication testing only), KIT, MEN1, MLH1, MSH2, MSH3, MSH6, MUTYH, NBN, NF1, NHTL1, PALB2, PDGFRA, PMS2, POLD1, POLE, PTEN, RAD50, RAD51C, RAD51D, RNF43, SDHB, SDHC, SDHD, SMAD4, SMARCA4. STK11, TP53, TSC1, TSC2, and VHL.  The following genes were evaluated for sequence changes only: SDHA and HOXB13 c.251G>A variant only. The report date is December 24, 2018.  DICER1 VUS identified.  This will not change medical decision making.   02/10/2019 -  Chemotherapy   The patient had palonosetron (ALOXI) injection 0.25 mg, 0.25 mg, Intravenous,  Once, 11 of 11 cycles Administration: 0.25 mg (02/10/2019), 0.25 mg (02/24/2019), 0.25 mg (03/10/2019), 0.25 mg (03/17/2019), 0.25 mg (03/31/2019), 0.25 mg (04/14/2019), 0.25 mg (05/12/2019), 0.25 mg (05/30/2019), 0.25 mg (06/14/2019), 0.25 mg (06/28/2019), 0.25 mg (07/13/2019), 0.25 mg (08/04/2019), 0.25 mg (08/18/2019), 0.25 mg (09/29/2019), 0.25 mg (10/27/2019) pegfilgrastim-jmdb (FULPHILA) injection 6 mg, 6 mg, Subcutaneous,  Once, 8 of 8 cycles Administration: 6 mg (02/25/2019), 6 mg (03/18/2019), 6 mg (05/31/2019), 6 mg (07/14/2019), 6 mg (08/05/2019), 6 mg (08/19/2019) pegfilgrastim-cbqv (UDENYCA) injection 6 mg, 6 mg, Subcutaneous, Once, 1 of 1 cycle Administration: 6 mg (06/29/2019) CISplatin (PLATINOL) 31 mg in sodium chloride 0.9 % 250 mL chemo infusion, 20  mg/m2 = 31 mg (80 % of original dose 25 mg/m2), Intravenous,  Once, 9 of 9 cycles Dose modification: 20 mg/m2 (80 % of original dose 25 mg/m2, Cycle 1, Reason: Other (see comments), Comment: renal dysfunction), 15 mg/m2 (60 % of  original dose 25 mg/m2, Cycle 11, Reason: Provider Judgment) Administration: 31 mg (02/10/2019), 31 mg (02/24/2019), 31 mg (03/10/2019), 31 mg (03/17/2019), 31 mg (03/31/2019), 31 mg (04/14/2019), 31 mg (05/30/2019), 31 mg (06/14/2019), 31 mg (06/28/2019), 31 mg (07/13/2019), 31 mg (08/04/2019), 31 mg (08/18/2019) gemcitabine (GEMZAR) 1,140 mg in sodium chloride 0.9 % 250 mL chemo infusion, 750 mg/m2 = 1,140 mg (75 % of original dose 1,000 mg/m2), Intravenous,  Once, 11 of 11 cycles Dose modification: 750 mg/m2 (75 % of original dose 1,000 mg/m2, Cycle 1, Reason: Provider Judgment), 500 mg/m2 (original dose 1,000 mg/m2, Cycle 10, Reason: Provider Judgment), 500 mg/m2 (50 % of original dose 1,000 mg/m2, Cycle 11, Reason: Provider Judgment) Administration: 1,140 mg (02/10/2019), 1,140 mg (02/24/2019), 1,140 mg (03/10/2019), 1,140 mg (03/17/2019), 1,140 mg (03/31/2019), 1,140 mg (04/14/2019), 1,140 mg (05/12/2019), 1,140 mg (05/30/2019), 1,140 mg (06/14/2019), 1,140 mg (06/28/2019), 1,140 mg (07/13/2019), 1,140 mg (08/04/2019), 1,140 mg (08/18/2019), 1,140 mg (09/29/2019), 760 mg (10/27/2019) fosaprepitant (EMEND) 150 mg, dexamethasone (DECADRON) 12 mg in sodium chloride 0.9 % 145 mL IVPB, , Intravenous,  Once, 11 of 11 cycles Administration:  (02/10/2019),  (02/24/2019),  (03/10/2019),  (03/17/2019),  (03/31/2019),  (04/14/2019),  (05/12/2019),  (05/30/2019),  (06/14/2019),  (06/28/2019),  (07/13/2019),  (08/04/2019),  (08/18/2019),  (10/27/2019)  for chemotherapy treatment.      CANCER STAGING: Cancer Staging Pancreatic cancer metastasized to liver [facility]) Staging form: Pancreas, AJCC 7th Edition - Clinical: Stage IV (T3, N1, M1) - Signed by Baird Cancer, PA-C on 03/16/2014 - Pathologic: No stage assigned - Unsigned   INTERVAL HISTORY:  Ms. Willetta York, a 86 y.o. female, returns for routine follow-up of her metastatic pancreatic cancer to liver. Tresea was last seen on 12/08/2019.   Today she is accompanied by her son. She is  not feeling good today. She complains of dizziness and hurting in her right mid back; her pain is worst in the mornings. She has never used a lidocaine patch, though she has used Fentanyl patches before. She is not tolerating food, but she denies any difficulty breathing. She denies nausea.  She is getting ERCP on 8/13 by Dr. Justice Britain.   REVIEW OF SYSTEMS:  Review of Systems  Constitutional: Positive for appetite change (severely decreased), fatigue (severe) and unexpected weight change (dropped 11 lbs).  Respiratory: Negative for shortness of breath.   Gastrointestinal: Positive for constipation and diarrhea. Negative for nausea.  Musculoskeletal: Positive for back pain (R lower and mid back pain).  Neurological: Positive for dizziness.  Psychiatric/Behavioral: Positive for sleep disturbance.  All other systems reviewed and are negative.   PAST MEDICAL/SURGICAL HISTORY:  Past Medical History:  Diagnosis Date  . Anemia due to antineoplastic chemotherapy 09/12/2015   Started Aranesp 500 mcg on 09/12/2015  . Anxiety   . Chronic diarrhea   . Depression   . DNR (do not resuscitate) 08/16/2015  . Erosive esophagitis   . Family history of breast cancer in female   . Family history of prostate cancer   . GERD (gastroesophageal reflux disease)   . Hx of adenomatous colonic polyps    tubular adenomas, last found in 2008  . Hyperplastic colon polyp 03/19/10   tcs by Dr. Gala Romney  . Hypertension 20 years   .  Kidney stone    hx/ crushed   . Opioid contract exists 04/18/2015   With Dr. Moshe Cipro  . Pancreatic cancer ([place]) 02/2014  . Pancreatic cancer metastasized to liver ([employer]) 03/10/2014  . Schatzki's ring 08/26/10   Last dilated on EGD by Dr. Trevor Iha [facility], linear gastric erosions, BI hemigastrectomy   Past Surgical History:  Procedure Laterality Date  . BREAST LUMPECTOMY Left   . bunion removal     from both feet   . CHOLECYSTECTOMY  1965   . COLONOSCOPY  03/19/2010   DR Gala Romney,,  normal TI, pancolonic diverticula, random colon bx neg., hyperplastic polyps removed  . ESOPHAGOGASTRODUODENOSCOPY  11/22/2003   DR Gala Romney, erosive RE, Billroth I  . ESOPHAGOGASTRODUODENOSCOPY  08/26/10   Dr. Gala Romney- moderate severe ERE, Scahtzki ring s/p dilation, Billroth I, linear gastric erosions, bx-gastric xanthelasma  . LEFT SHOULDER SURGERY  2009   DR HARRISON  . ORIF ANKLE FRACTURE Right 05/22/2013   Procedure: OPEN REDUCTION INTERNAL FIXATION (ORIF) RIGHT ANKLE FRACTURE;  Surgeon: Sanjuana Kava, MD;  Location: [facility];  Service: Orthopedics;  Laterality: Right;  . stomach ulcer  50 years ago    had some of her stomach removed     SOCIAL HISTORY:  Social History   Socioeconomic History  . Marital status: Widowed    Spouse name: Not on file  . Number of children: 2  . Years of education: 41  . Highest education level: 12th grade  Occupational History  . Occupation: retired from Estate manager/land agent: RETIRED  Tobacco Use  . Smoking status: Current Some Day Smoker    Packs/day: 0.50    Years: 60.00    Pack years: 30.00    Types: Cigarettes    Last attempt to quit: 09/09/2016    Years since quitting: 3.2  . Smokeless tobacco: Never Used  Vaping Use  . Vaping Use: Never used  Substance and Sexual Activity  . Alcohol use: No  . Drug use: No  . Sexual activity: Not Currently  Other Topics Concern  . Not on file  Social History Narrative   Lives with son alone    Social Determinants of Health   Financial Resource Strain:   . Difficulty of Paying Living Expenses:   Food Insecurity:   . Worried About Charity fundraiser in the Last Year:   . Arboriculturist in the Last Year:   Transportation Needs:   . Film/video editor (Medical):   Marland Kitchen Lack of Transportation (Non-Medical):   Physical Activity:   . Days of Exercise per Week:   . Minutes of Exercise per Session:   Stress:   . Feeling of Stress :   Social Connections:   . Frequency of Communication with Friends and  Family:   . Frequency of Social Gatherings with Friends and Family:   . Attends Religious Services:   . Active Member of Clubs or Organizations:   . Attends Archivist Meetings:   Marland Kitchen Marital Status:   Intimate Partner Violence:   . Fear of Current or Ex-Partner:   . Emotionally Abused:   Marland Kitchen Physically Abused:   . Sexually Abused:     FAMILY HISTORY:  Family History  Problem Relation Age of Onset  . Hypertension Mother   . Kidney failure Brother        on dialysis  . Diabetes Sister   . Diabetes Sister   . Hypertension Father   . Kidney failure Sister   .  Kidney Stones Brother   . Breast cancer Son 73  . Gout Son 44  . Gout Nephew 57  . Liver disease Neg Hx   . Colon cancer Neg Hx     CURRENT MEDICATIONS:  Current Outpatient Medications  Medication Sig Dispense Refill  . amLODipine (NORVASC) 10 MG tablet TAKE 1 TABLET DAILY (Patient taking differently: Take 10 mg by mouth daily. ) 90 tablet 3  . CISPLATIN IV Inject into the vein. Days 1, 8 every 21 days    . clonazePAM (KLONOPIN) 0.5 MG tablet Take 1 tablet (0.5 mg total) by mouth at bedtime. (Patient taking differently: Take 0.5 mg by mouth daily. ) 90 tablet 1  . cycloSPORINE (RESTASIS) 0.05 % ophthalmic emulsion Place 1 drop into both eyes 2 (two) times daily.    . ergocalciferol (VITAMIN D2) 1.25 MG (50000 UT) capsule Take 1 capsule (50,000 Units total) by mouth once a week. (Patient taking differently: Take 50,000 Units by mouth every Wednesday. ) 4 capsule 3  . fluticasone (CUTIVATE) 0.05 % cream Apply 1 application topically daily as needed (rash).     . GEMCITABINE HCL IV Inject into the vein. Days 1, 8 every 21 days    . HYDROcodone-acetaminophen (NORCO) 5-325 MG tablet Take 1 tablet by mouth every 8 (eight) hours as needed for moderate pain. (Patient taking differently: Take 0.5 tablets by mouth every 8 (eight) hours as needed for moderate pain. ) 75 tablet 0  . lidocaine-prilocaine (EMLA) cream Apply 1  application topically as needed. Apply to portacath site as needed (Patient taking differently: Apply 1 application topically daily as needed (port access). ) 30 g 1  . loperamide (IMODIUM A-D) 2 MG tablet Take 2 mg by mouth daily.     . metoprolol tartrate (LOPRESSOR) 25 MG tablet TAKE 1 TABLET DAILY FOR BLOOD PRESSURE (DOSE REDUCTION) (Patient taking differently: Take 25 mg by mouth daily. ) 90 tablet 3  . MYRBETRIQ 50 MG TB24 tablet Take 50 mg by mouth daily.    Marland Kitchen oxybutynin (DITROPAN-XL) 10 MG 24 hr tablet TAKE 1 TABLET(5 MG) BY MOUTH AT BEDTIME 30 tablet 6  . pantoprazole (PROTONIX) 20 MG tablet TAKE 1 TABLET DAILY (Patient taking differently: Take 20 mg by mouth daily. ) 90 tablet 3  . potassium chloride (KLOR-CON) 10 MEQ tablet Take 2 tablets (20 mEq total) by mouth daily. 60 tablet 3  . traMADol (ULTRAM) 50 MG tablet Take 1 tablet (50 mg total) by mouth 3 (three) times daily. 30 tablet 2   No current facility-administered medications for this visit.   Facility-Administered Medications Ordered in Other Visits  Medication Dose Route Frequency Provider Last Rate Last Admin  . heparin lock flush 100 unit/mL  500 Units Intravenous Once Derek Jack, MD      . sodium chloride flush (NS) 0.9 % injection 10 mL  10 mL Intravenous PRN Derek Jack, MD        ALLERGIES:  Allergies  Allergen Reactions  . Iohexol Hives and Shortness Of Breath    **Patient can not have IV contrast, pt has breakthrough reaction even after 13 hour premeds** Do not give IV contrast PATIENT HAD TO BE TAKEN TO ED, C/O WHELPS, HIVES, DIFFICULTY BREATHING. PATIENT GIVEN IV CONTRAST AFTER 13 HOUR PRE MEDS ON 01/09/2017 WITH NO REACTION NOTED. On 10/19/17 patient had reaction despite premedication.    Marland Kitchen Aciphex [Rabeprazole Sodium] Other (See Comments)    Unknown reaction  . Amlodipine Besylate-Valsartan     Rash to  high-dose (5/320), can take plain amlodipine   . Esomeprazole Magnesium     Unknown  reaction  . Omeprazole Other (See Comments)    Patient states "medication didn't work"  . Ciprofloxacin Rash  . Penicillins Swelling and Rash    Has patient had a PCN reaction causing immediate rash, facial/tongue/throat swelling, SOB or lightheadedness with hypotension: Yes Has patient had a PCN reaction causing severe rash involving mucus membranes or skin necrosis: Yes Has patient had a PCN reaction that required hospitalization: No Has patient had a PCN reaction occurring within the last 10 years: yes If all of the above answers are "NO", then may proceed with Cephalosporin use.   [name] [Benzonatate] Itching    Full head, face, neck, and chest itching.     PHYSICAL EXAM:  Performance status (ECOG): 1 - Symptomatic but completely ambulatory  Vitals:   12/22/19 0811  BP: (!) 128/58  Pulse: 66  Resp: 16  Temp: (!) 97 F (36.1 C)  SpO2: 100%   Wt Readings from Last 3 Encounters:  12/22/19 82 lb 4.8 oz (37.3 kg)  12/08/19 (!) 93 lb (42.2 kg)  11/24/19 92 lb 4.8 oz (41.9 kg)   Physical Exam Vitals reviewed.  Constitutional:      Appearance: She is ill-appearing.  Eyes:     General: Scleral icterus present.  Cardiovascular:     Rate and Rhythm: Normal rate and regular rhythm.     Pulses: Normal pulses.     Heart sounds: Normal heart sounds.  Pulmonary:     Effort: Pulmonary effort is normal.     Breath sounds: Normal breath sounds.  Chest:     Comments: Port-a-Cath in R chest Musculoskeletal:     Right lower leg: No edema.     Left lower leg: No edema.  Skin:    Coloration: Skin is jaundiced.  Neurological:     General: No focal deficit present.     Mental Status: She is alert and oriented to person, place, and time.  Psychiatric:        Mood and Affect: Mood normal.        Behavior: Behavior normal.      LABORATORY DATA:  I have reviewed the labs as listed.  CBC Latest Ref Rng & Units 12/20/2019 12/08/2019 11/24/2019  WBC 4.0 - 10.5 K/uL 8.5 8.1  7.5  Hemoglobin 12.0 - 15.0 g/dL 10.9(L) 9.8(L) 9.9(L)  Hematocrit 36 - 46 % 31.4(L) 28.2(L) 29.3(L)  Platelets 150 - 400 K/uL 153.0 149(L) 155   CMP Latest Ref Rng & Units 12/20/2019 12/08/2019 11/24/2019  Glucose 70 - 99 mg/dL 128(H) 114(H) 107(H)  BUN 6 - 23 mg/dL 27(H) 22 19  Creatinine 0.40 - 1.20 mg/dL 1.59(H) 1.25(H) 1.21(H)  Sodium 135 - 145 mEq/L 136 135 135  Potassium 3.5 - 5.1 mEq/L 4.3 2.6(LL) 3.0(L)  Chloride 96 - 112 mEq/L 99 93(L) 98  CO2 19 - 32 mEq/L _0 Calcium 8.4 - 10.5 mg/dL 8.8 7.6(L) 8.3(L)  Total Protein 6.0 - 8.3 g/dL 7.7 7.0 7.2  Total Bilirubin 0.2 - 1.2 mg/dL 11.5(H) 7.9(H) 2.5(H)  Alkaline Phos 39 - 117 U/L 250(H) 206(H) 221(H)  AST 0 - 37 U/L 81(H) 95(H) 132(H)  ALT 0 - 35 U/L 53(H) 51(H) 91(H)    DIAGNOSTIC IMAGING:  I have independently reviewed the scans and discussed with the patient. MR 3D Recon At Scanner  Result Date: 12/16/2019 CLINICAL DATA:  Pancreatic cancer with liver metastasis. Elevated  bilirubin. EXAM: MRI ABDOMEN WITHOUT AND WITH CONTRAST (INCLUDING MRCP) TECHNIQUE: Multiplanar multisequence MR imaging of the abdomen was performed both before and after the administration of intravenous contrast. Heavily T2-weighted images of the biliary and pancreatic ducts were obtained, and three-dimensional MRCP images were rendered by post processing. CONTRAST:  58m GADAVIST GADOBUTROL 1 MMOL/ML IV SOLN COMPARISON:  Noncontrast CT 09/16/2019, 06/09/2019 FINDINGS: Lower chest: Nodule at the RIGHT lung base measuring 7 mm (image 2/series 6) is not evident on comparison CT. Hepatobiliary: While there is differing technique, there is clear enlargement of the hepatic lesions. For example lesion the superior RIGHT hepatic lobe measuring 6.6 x 4.6 cm compares to 4.9 x 3.8 cm. Lesion central lateral LEFT hepatic lobe measuring 2.8 x 2.8 cm compares to 2.1 x 2.1 cm. There is marked dilatation of the LEFT hepatic biliary tree. The a central bile ducts on the LEFT  measure up to 12 mm (image 11/6). There is abrupt cut off of the dilated ducts near the level of the confluence of the LEFT and RIGHT hepatic ducts. Mild ductal dilatation within the RIGHT ductal system. Dilatation well demonstrated on the MRCP sequences (series 11). On the most delayed contrast imaging there is a tumor at the junction of LEFT and RIGHT hepatic ducts (image 37/31) which presumably obstructs the LEFT ductal system. The common bile duct is dilated to 9 mm distally. Pancreas: No discrete pancreatic mass identified. No pancreatic duct dilatation Spleen: Cyst within the central spleen. Adrenals/urinary tract: Adrenal glands normal. Multiple nonenhancing cysts within the kidneys. Stomach/Bowel: Stomach and limited view of the bowel is unremarkable. Vascular/Lymphatic: Abdominal aortic normal caliber. No retroperitoneal periportal lymphadenopathy. Musculoskeletal: No aggressive osseous lesion. IMPRESSION: 1. Interval enlargement of a hepatic metastasis. 2. Severe intrahepatic duct dilatation of the LEFT hepatic lobe ductal system. Obstruction appears at the level of the confluence of LEFT and RIGHT hepatic ducts. There is a liver metastasis at this level. Mild dilatation of the RIGHT ductal system. 3. No pancreatic lesion identified. 4. New nodule in the RIGHT lower lobe concerning for metastatic nodule. These results will be called to the ordering clinician or representative by the Radiologist Assistant, and communication documented in the PACS or [employer] Electronically Signed   By: SSuzy BouchardM.D.   On: 12/16/2019 16:29   MR ABDOMEN MRCP W WO CONTAST  Result Date: 12/16/2019 CLINICAL DATA:  Pancreatic cancer with liver metastasis. Elevated bilirubin. EXAM: MRI ABDOMEN WITHOUT AND WITH CONTRAST (INCLUDING MRCP) TECHNIQUE: Multiplanar multisequence MR imaging of the abdomen was performed both before and after the administration of intravenous contrast. Heavily T2-weighted images of the  biliary and pancreatic ducts were obtained, and three-dimensional MRCP images were rendered by post processing. CONTRAST:  423mGADAVIST GADOBUTROL 1 MMOL/ML IV SOLN COMPARISON:  Noncontrast CT 09/16/2019, 06/09/2019 FINDINGS: Lower chest: Nodule at the RIGHT lung base measuring 7 mm (image 2/series 6) is not evident on comparison CT. Hepatobiliary: While there is differing technique, there is clear enlargement of the hepatic lesions. For example lesion the superior RIGHT hepatic lobe measuring 6.6 x 4.6 cm compares to 4.9 x 3.8 cm. Lesion central lateral LEFT hepatic lobe measuring 2.8 x 2.8 cm compares to 2.1 x 2.1 cm. There is marked dilatation of the LEFT hepatic biliary tree. The a central bile ducts on the LEFT measure up to 12 mm (image 11/6). There is abrupt cut off of the dilated ducts near the level of the confluence of the LEFT and RIGHT hepatic ducts. Mild ductal dilatation  within the RIGHT ductal system. Dilatation well demonstrated on the MRCP sequences (series 11). On the most delayed contrast imaging there is a tumor at the junction of LEFT and RIGHT hepatic ducts (image 37/31) which presumably obstructs the LEFT ductal system. The common bile duct is dilated to 9 mm distally. Pancreas: No discrete pancreatic mass identified. No pancreatic duct dilatation Spleen: Cyst within the central spleen. Adrenals/urinary tract: Adrenal glands normal. Multiple nonenhancing cysts within the kidneys. Stomach/Bowel: Stomach and limited view of the bowel is unremarkable. Vascular/Lymphatic: Abdominal aortic normal caliber. No retroperitoneal periportal lymphadenopathy. Musculoskeletal: No aggressive osseous lesion. IMPRESSION: 1. Interval enlargement of a hepatic metastasis. 2. Severe intrahepatic duct dilatation of the LEFT hepatic lobe ductal system. Obstruction appears at the level of the confluence of LEFT and RIGHT hepatic ducts. There is a liver metastasis at this level. Mild dilatation of the RIGHT ductal  system. 3. No pancreatic lesion identified. 4. New nodule in the RIGHT lower lobe concerning for metastatic nodule. These results will be called to the ordering clinician or representative by the Radiologist Assistant, and communication documented in the PACS or [employer]. Electronically Signed   By: Suzy Bouchard M.D.   On: 12/16/2019 16:29     ASSESSMENT:  1. Metastatic pancreatic cancer to the liver: -Gemcitabine and Abraxane from November 2015 through August 2018 with progression. -Gemcitabine and Tarceva from 01/28/2017 through 10/07/2017 with progression. -21 cycles of infusional 5-FU and Onivyde from 10/21/2017 through 09/08/2018. -7 cycles of FOLFOX from 09/22/2018 through 12/29/2018. -Gemcitabine and cisplatin day 1 and day 15 started on 02/10/2019. -CT CAP on 09/16/2019 showed mass in the posterior liver dome measuring 4.6 x 3.6 cm compared to 4 x 3.2 cm on 06/01/2019. Left lobe mass also improved with 3 mm. No new hepatic lesions. Study limited by lack of IV contrast. No new areas of metastatic disease.   PLAN:  1. Metastatic pancreatic cancer to the liver: -We reviewed results of the MRCP from 12/16/2019 which showed enlargement of hepatic metastatic disease.  Severe intrahepatic duct dilatation of the left hepatic lobe system.  Obstruction appears to be at the level of the confluence of left and right hepatic ducts. -He is undergoing ERCP and possible stent placement by Dr. Rush Landmark. -We talked about palliative care in the form of hospice.  Patient is agreeable.  We will make a referral home hospice.  2. Hypomagnesemia: -Magnesium is 2.0.  She will continue supplements.  3. Left lateral chest wall pain: -Continue tramadol.  Pain has slightly worsened.  Will prescribe lidocaine patch.  4. Overactive bladder: -Continue oxybutynin 10 mg daily.  5. Weight loss: -Continue Remeron at bedtime.  6. Vitamin D deficiency: -Continue vitamin D supplements.     Orders placed this encounter:  No orders of the defined types were placed in this encounter.    Derek Jack, MD [place] [phone]   I, Milinda Antis, am acting as a scribe for Dr. Sanda Linger.  I, Derek Jack MD, have reviewed the above documentation for accuracy and completeness, and I agree with the above.

## 2019-12-22 NOTE — Patient Instructions (Signed)
[facility]  Discharge Instructions:  IV hydration received today. _______________________________________________________________  Thank you for choosing [facility] to provide your oncology and hematology care.  To afford each patient quality time with our providers, please arrive at least 15 minutes before your scheduled appointment.  You need to re-schedule your appointment if you arrive 10 or more minutes late.  We strive to give you quality time with our providers, and arriving late affects you and other patients whose appointments are after yours.  Also, if you no show three or more times for appointments you may be dismissed from the clinic.  Again, thank you for choosing [facility]. Our hope is that these requests will allow you access to exceptional care and in a timely manner. _______________________________________________________________  If you have questions after your visit, please contact our office at [phone] between the hours of 8:30 a.m. and 5:00 p.m. Voicemails left after 4:30 p.m. will not be returned until the following business day. _______________________________________________________________  For prescription refill requests, have your pharmacy contact our office. _______________________________________________________________  Recommendations made by the consultant and any test results will be sent to your referring physician. _______________________________________________________________ 

## 2019-12-23 ENCOUNTER — Ambulatory Visit (HOSPITAL_COMMUNITY): Payer: Medicare HMO

## 2019-12-23 ENCOUNTER — Inpatient Hospital Stay (HOSPITAL_COMMUNITY)
Admission: RE | Admit: 2019-12-23 | Discharge: 2019-12-26 | DRG: 444 | Disposition: A | Discharge status: Home Health | Payer: Medicare HMO | Attending: Internal Medicine | Admitting: Internal Medicine

## 2019-12-23 ENCOUNTER — Ambulatory Visit (HOSPITAL_COMMUNITY): Payer: Medicare HMO | Admitting: Anesthesiology

## 2019-12-23 ENCOUNTER — Encounter (HOSPITAL_COMMUNITY)
Admission: RE | Disposition: A | Discharge status: Home / Self Care | Payer: Self-pay | Source: Home / Self Care | Attending: Internal Medicine

## 2019-12-23 ENCOUNTER — Encounter (HOSPITAL_COMMUNITY): Payer: Self-pay | Admitting: Gastroenterology

## 2019-12-23 DIAGNOSIS — R188 Other ascites: Secondary | ICD-10-CM | POA: Diagnosis present

## 2019-12-23 DIAGNOSIS — Z881 Allergy status to other antibiotic agents status: Secondary | ICD-10-CM | POA: Diagnosis not present

## 2019-12-23 DIAGNOSIS — E871 Hypo-osmolality and hyponatremia: Secondary | ICD-10-CM | POA: Diagnosis not present

## 2019-12-23 DIAGNOSIS — F17210 Nicotine dependence, cigarettes, uncomplicated: Secondary | ICD-10-CM | POA: Diagnosis present

## 2019-12-23 DIAGNOSIS — Z880 Allergy status to penicillin: Secondary | ICD-10-CM | POA: Diagnosis not present

## 2019-12-23 DIAGNOSIS — K449 Diaphragmatic hernia without obstruction or gangrene: Secondary | ICD-10-CM | POA: Diagnosis present

## 2019-12-23 DIAGNOSIS — E875 Hyperkalemia: Secondary | ICD-10-CM | POA: Diagnosis not present

## 2019-12-23 DIAGNOSIS — Z841 Family history of disorders of kidney and ureter: Secondary | ICD-10-CM

## 2019-12-23 DIAGNOSIS — K831 Obstruction of bile duct: Principal | ICD-10-CM | POA: Diagnosis present

## 2019-12-23 DIAGNOSIS — Z515 Encounter for palliative care: Secondary | ICD-10-CM | POA: Diagnosis not present

## 2019-12-23 DIAGNOSIS — Z79899 Other long term (current) drug therapy: Secondary | ICD-10-CM | POA: Diagnosis not present

## 2019-12-23 DIAGNOSIS — Z66 Do not resuscitate: Secondary | ICD-10-CM | POA: Diagnosis present

## 2019-12-23 DIAGNOSIS — T451X5A Adverse effect of antineoplastic and immunosuppressive drugs, initial encounter: Secondary | ICD-10-CM | POA: Diagnosis not present

## 2019-12-23 DIAGNOSIS — F419 Anxiety disorder, unspecified: Secondary | ICD-10-CM | POA: Diagnosis present

## 2019-12-23 DIAGNOSIS — Z20822 Contact with and (suspected) exposure to covid-19: Secondary | ICD-10-CM | POA: Diagnosis present

## 2019-12-23 DIAGNOSIS — E43 Unspecified severe protein-calorie malnutrition: Secondary | ICD-10-CM | POA: Diagnosis present

## 2019-12-23 DIAGNOSIS — R17 Unspecified jaundice: Secondary | ICD-10-CM

## 2019-12-23 DIAGNOSIS — C801 Malignant (primary) neoplasm, unspecified: Secondary | ICD-10-CM | POA: Diagnosis not present

## 2019-12-23 DIAGNOSIS — R945 Abnormal results of liver function studies: Secondary | ICD-10-CM | POA: Diagnosis not present

## 2019-12-23 DIAGNOSIS — D6481 Anemia due to antineoplastic chemotherapy: Secondary | ICD-10-CM | POA: Diagnosis present

## 2019-12-23 DIAGNOSIS — Z833 Family history of diabetes mellitus: Secondary | ICD-10-CM | POA: Diagnosis not present

## 2019-12-23 DIAGNOSIS — Z888 Allergy status to other drugs, medicaments and biological substances status: Secondary | ICD-10-CM | POA: Diagnosis not present

## 2019-12-23 DIAGNOSIS — C787 Secondary malignant neoplasm of liver and intrahepatic bile duct: Secondary | ICD-10-CM | POA: Diagnosis present

## 2019-12-23 DIAGNOSIS — K2100 Gastro-esophageal reflux disease with esophagitis, without bleeding: Secondary | ICD-10-CM | POA: Diagnosis present

## 2019-12-23 DIAGNOSIS — C259 Malignant neoplasm of pancreas, unspecified: Secondary | ICD-10-CM | POA: Diagnosis present

## 2019-12-23 DIAGNOSIS — Z803 Family history of malignant neoplasm of breast: Secondary | ICD-10-CM

## 2019-12-23 DIAGNOSIS — Z681 Body mass index (BMI) 19 or less, adult: Secondary | ICD-10-CM | POA: Diagnosis not present

## 2019-12-23 DIAGNOSIS — I10 Essential (primary) hypertension: Secondary | ICD-10-CM | POA: Diagnosis present

## 2019-12-23 DIAGNOSIS — Z7189 Other specified counseling: Secondary | ICD-10-CM | POA: Diagnosis not present

## 2019-12-23 DIAGNOSIS — Z9889 Other specified postprocedural states: Secondary | ICD-10-CM | POA: Diagnosis not present

## 2019-12-23 HISTORY — PX: PANCREATIC STENT PLACEMENT: SHX5539

## 2019-12-23 HISTORY — PX: ENDOSCOPIC RETROGRADE CHOLANGIOPANCREATOGRAPHY (ERCP) WITH PROPOFOL: SHX5810

## 2019-12-23 HISTORY — PX: SPHINCTEROTOMY: SHX5544

## 2019-12-23 LAB — CBC
HCT: 32 % — ABNORMAL LOW (ref 36.0–46.0)
Hemoglobin: 10.5 g/dL — ABNORMAL LOW (ref 12.0–15.0)
MCH: 33.2 pg (ref 26.0–34.0)
MCHC: 32.8 g/dL (ref 30.0–36.0)
MCV: 101.3 fL — ABNORMAL HIGH (ref 80.0–100.0)
Platelets: 142 10*3/uL — ABNORMAL LOW (ref 150–400)
RBC: 3.16 MIL/uL — ABNORMAL LOW (ref 3.87–5.11)
RDW: 18.5 % — ABNORMAL HIGH (ref 11.5–15.5)
WBC: 6 10*3/uL (ref 4.0–10.5)
nRBC: 0 % (ref 0.0–0.2)

## 2019-12-23 LAB — CREATININE, SERUM
Creatinine, Ser: 1.19 mg/dL — ABNORMAL HIGH (ref 0.44–1.00)
GFR calc Af Amer: 47 mL/min — ABNORMAL LOW (ref >60)
GFR calc non Af Amer: 40 mL/min — ABNORMAL LOW (ref >60)

## 2019-12-23 SURGERY — ENDOSCOPIC RETROGRADE CHOLANGIOPANCREATOGRAPHY (ERCP) WITH PROPOFOL
Anesthesia: General

## 2019-12-23 MED ORDER — LACTATED RINGERS IV SOLN
INTRAVENOUS | Status: DC
  Administered 2019-12-23: 1000 mL via INTRAVENOUS

## 2019-12-23 MED ORDER — METHYLPREDNISOLONE SODIUM SUCC 125 MG IJ SOLR
125.0000 mg | Freq: Once | INTRAMUSCULAR | Status: AC
  Administered 2019-12-23: 125 mg via INTRAVENOUS
  Filled 2019-12-23: qty 2

## 2019-12-23 MED ORDER — SODIUM CHLORIDE 0.9% FLUSH
10.0000 mL | INTRAVENOUS | Status: DC | PRN
  Administered 2019-12-26: 10 mL

## 2019-12-23 MED ORDER — GLUCAGON HCL RDNA (DIAGNOSTIC) 1 MG IJ SOLR
INTRAMUSCULAR | Status: AC
  Filled 2019-12-23: qty 1

## 2019-12-23 MED ORDER — MORPHINE SULFATE (PF) 4 MG/ML IV SOLN
4.0000 mg | INTRAVENOUS | Status: DC | PRN
  Administered 2019-12-23 – 2019-12-26 (×3): 4 mg via INTRAVENOUS
  Filled 2019-12-23 (×3): qty 1

## 2019-12-23 MED ORDER — ONDANSETRON HCL 4 MG/2ML IJ SOLN: 4.0000 mg | Freq: Four times a day (QID) | INTRAMUSCULAR | Status: DC | PRN

## 2019-12-23 MED ORDER — ONDANSETRON HCL 4 MG/2ML IJ SOLN: 4.0000 mg | Freq: Once | INTRAMUSCULAR | Status: DC | PRN

## 2019-12-23 MED ORDER — CHLORHEXIDINE GLUCONATE CLOTH 2 % EX PADS
6.0000 | MEDICATED_PAD | Freq: Every day | CUTANEOUS | Status: DC
  Administered 2019-12-26: 6 via TOPICAL

## 2019-12-23 MED ORDER — GLUCAGON HCL RDNA (DIAGNOSTIC) 1 MG IJ SOLR
INTRAMUSCULAR | Status: DC | PRN
  Administered 2019-12-23 (×2): .25 mg via INTRAVENOUS
  Administered 2019-12-23: .5 mg via INTRAVENOUS
  Administered 2019-12-23 (×3): .25 mg via INTRAVENOUS

## 2019-12-23 MED ORDER — MIRABEGRON ER 25 MG PO TB24
25.0000 mg | ORAL_TABLET | Freq: Every day | ORAL | Status: DC
  Administered 2019-12-23 – 2019-12-26 (×4): 25 mg via ORAL
  Filled 2019-12-23 (×4): qty 1

## 2019-12-23 MED ORDER — METHYLPREDNISOLONE 16 MG PO TABS
32.0000 mg | ORAL_TABLET | Freq: Every day | ORAL | Status: AC
  Administered 2019-12-23: 32 mg via ORAL
  Filled 2019-12-23: qty 2

## 2019-12-23 MED ORDER — ONDANSETRON HCL 4 MG/2ML IJ SOLN
INTRAMUSCULAR | Status: DC | PRN
  Administered 2019-12-23: 4 mg via INTRAVENOUS

## 2019-12-23 MED ORDER — DIPHENHYDRAMINE HCL 50 MG/ML IJ SOLN
25.0000 mg | Freq: Once | INTRAMUSCULAR | Status: AC
  Administered 2019-12-23: 25 mg via INTRAVENOUS

## 2019-12-23 MED ORDER — PROPOFOL 10 MG/ML IV BOLUS
INTRAVENOUS | Status: DC | PRN
  Administered 2019-12-23: 80 mg via INTRAVENOUS

## 2019-12-23 MED ORDER — FENTANYL CITRATE (PF) 100 MCG/2ML IJ SOLN
INTRAMUSCULAR | Status: AC
  Filled 2019-12-23: qty 2

## 2019-12-23 MED ORDER — FENTANYL CITRATE (PF) 100 MCG/2ML IJ SOLN
INTRAMUSCULAR | Status: DC | PRN
  Administered 2019-12-23 (×3): 50 ug via INTRAVENOUS
  Administered 2019-12-23 (×2): 25 ug via INTRAVENOUS

## 2019-12-23 MED ORDER — DIPHENHYDRAMINE HCL 25 MG PO CAPS
50.0000 mg | ORAL_CAPSULE | Freq: Once | ORAL | Status: AC
  Administered 2019-12-24: 50 mg via ORAL
  Filled 2019-12-23: qty 2

## 2019-12-23 MED ORDER — PROPOFOL 10 MG/ML IV BOLUS
INTRAVENOUS | Status: AC
  Filled 2019-12-23: qty 20

## 2019-12-23 MED ORDER — ENOXAPARIN SODIUM 30 MG/0.3ML ~~LOC~~ SOLN
30.0000 mg | SUBCUTANEOUS | Status: DC
  Administered 2019-12-25 – 2019-12-26 (×2): 30 mg via SUBCUTANEOUS
  Filled 2019-12-23 (×2): qty 0.3

## 2019-12-23 MED ORDER — FAMOTIDINE 20 MG PO TABS
20.0000 mg | ORAL_TABLET | Freq: Once | ORAL | Status: AC
  Administered 2019-12-24: 20 mg via ORAL
  Filled 2019-12-23: qty 1

## 2019-12-23 MED ORDER — LIDOCAINE 2% (20 MG/ML) 5 ML SYRINGE
INTRAMUSCULAR | Status: DC | PRN
  Administered 2019-12-23: 40 mg via INTRAVENOUS

## 2019-12-23 MED ORDER — OXYBUTYNIN CHLORIDE ER 5 MG PO TB24: 5.0000 mg | ORAL_TABLET | Freq: Every day | ORAL | Status: DC

## 2019-12-23 MED ORDER — METOPROLOL TARTRATE 25 MG PO TABS
25.0000 mg | ORAL_TABLET | Freq: Every day | ORAL | Status: DC
  Administered 2019-12-24 – 2019-12-26 (×3): 25 mg via ORAL
  Filled 2019-12-23 (×3): qty 1

## 2019-12-23 MED ORDER — SODIUM CHLORIDE 0.9 % IV SOLN: INTRAVENOUS | Status: DC

## 2019-12-23 MED ORDER — CYCLOSPORINE 0.05 % OP EMUL
1.0000 [drp] | Freq: Two times a day (BID) | OPHTHALMIC | Status: DC
  Administered 2019-12-23 – 2019-12-26 (×6): 1 [drp] via OPHTHALMIC
  Filled 2019-12-23 (×7): qty 1

## 2019-12-23 MED ORDER — KCL IN DEXTROSE-NACL 20-5-0.45 MEQ/L-%-% IV SOLN
INTRAVENOUS | Status: DC
  Filled 2019-12-23 (×3): qty 1000

## 2019-12-23 MED ORDER — SUCCINYLCHOLINE CHLORIDE 20 MG/ML IJ SOLN
INTRAMUSCULAR | Status: DC | PRN
  Administered 2019-12-23: 60 mg via INTRAVENOUS

## 2019-12-23 MED ORDER — ENOXAPARIN SODIUM 40 MG/0.4ML ~~LOC~~ SOLN: 40.0000 mg | SUBCUTANEOUS | Status: DC

## 2019-12-23 MED ORDER — SODIUM CHLORIDE 0.9 % IV SOLN
INTRAVENOUS | Status: DC | PRN
  Administered 2019-12-23: 60 mL

## 2019-12-23 MED ORDER — INDOMETHACIN 50 MG RE SUPP
RECTAL | Status: DC | PRN
  Administered 2019-12-23: 100 mg via RECTAL

## 2019-12-23 MED ORDER — POLYETHYLENE GLYCOL 3350 17 G PO PACK: 17.0000 g | PACK | Freq: Every day | ORAL | Status: DC | PRN

## 2019-12-23 MED ORDER — FENTANYL CITRATE (PF) 100 MCG/2ML IJ SOLN: 25.0000 ug | INTRAMUSCULAR | Status: DC | PRN

## 2019-12-23 MED ORDER — ENOXAPARIN SODIUM 30 MG/0.3ML ~~LOC~~ SOLN: 30.0000 mg | SUBCUTANEOUS | Status: DC

## 2019-12-23 MED ORDER — CLONAZEPAM 0.5 MG PO TABS
0.5000 mg | ORAL_TABLET | Freq: Every day | ORAL | Status: DC
  Administered 2019-12-24 – 2019-12-26 (×3): 0.5 mg via ORAL
  Filled 2019-12-23 (×3): qty 1

## 2019-12-23 MED ORDER — METHYLPREDNISOLONE 16 MG PO TABS
32.0000 mg | ORAL_TABLET | Freq: Every day | ORAL | Status: DC
  Administered 2019-12-24 – 2019-12-26 (×3): 32 mg via ORAL
  Filled 2019-12-23 (×3): qty 2

## 2019-12-23 MED ORDER — TRAMADOL HCL 50 MG PO TABS: 50.0000 mg | ORAL_TABLET | Freq: Three times a day (TID) | ORAL | Status: DC

## 2019-12-23 MED ORDER — DEXAMETHASONE SODIUM PHOSPHATE 10 MG/ML IJ SOLN
INTRAMUSCULAR | Status: DC | PRN
  Administered 2019-12-23: 5 mg via INTRAVENOUS

## 2019-12-23 MED ORDER — DIPHENHYDRAMINE HCL 50 MG/ML IJ SOLN
INTRAMUSCULAR | Status: AC
  Filled 2019-12-23: qty 1

## 2019-12-23 MED ORDER — MIRABEGRON ER 25 MG PO TB24: 50.0000 mg | ORAL_TABLET | Freq: Every day | ORAL | Status: DC

## 2019-12-23 MED ORDER — TRAMADOL HCL 50 MG PO TABS
50.0000 mg | ORAL_TABLET | Freq: Two times a day (BID) | ORAL | Status: DC
  Administered 2019-12-23 – 2019-12-26 (×6): 50 mg via ORAL
  Filled 2019-12-23 (×6): qty 1

## 2019-12-23 MED ORDER — SODIUM CHLORIDE 0.9 % IV SOLN
1.0000 g | Freq: Once | INTRAVENOUS | Status: AC
  Administered 2019-12-23: 1 g via INTRAVENOUS
  Filled 2019-12-23 (×2): qty 1

## 2019-12-23 MED ORDER — INDOMETHACIN 50 MG RE SUPP
RECTAL | Status: AC
  Filled 2019-12-23: qty 2

## 2019-12-23 MED ORDER — ONDANSETRON HCL 4 MG PO TABS: 4.0000 mg | ORAL_TABLET | Freq: Four times a day (QID) | ORAL | Status: DC | PRN

## 2019-12-23 MED ORDER — SODIUM CHLORIDE 0.9 % IV SOLN
500.0000 mg | Freq: Two times a day (BID) | INTRAVENOUS | Status: DC
  Administered 2019-12-23 – 2019-12-26 (×6): 500 mg via INTRAVENOUS
  Filled 2019-12-23 (×2): qty 0.5
  Filled 2019-12-23 (×4): qty 500

## 2019-12-23 NOTE — Anesthesia Postprocedure Evaluation (Signed)
Anesthesia Post Note  Patient: Nettie Kenton Kingfisher  Procedure(s) Performed: ENDOSCOPIC RETROGRADE CHOLANGIOPANCREATOGRAPHY (ERCP) WITH PROPOFOL (N/A ) SPHINCTEROTOMY PANCREATIC STENT PLACEMENT     Patient location during evaluation: PACU Anesthesia Type: General Level of consciousness: awake and alert and oriented Pain management: pain level controlled Vital Signs Assessment: post-procedure vital signs reviewed and stable Respiratory status: spontaneous breathing, nonlabored ventilation, respiratory function stable and patient connected to nasal cannula oxygen Cardiovascular status: blood pressure returned to baseline and stable Postop Assessment: no apparent nausea or vomiting Anesthetic complications: no   No complications documented.  Last Vitals:  Vitals:   12/23/19 1250 12/23/19 1300  BP: (!) 184/75 (!) 188/72  Pulse: (!) 57 (!) 57  Resp: 12 12  Temp:    SpO2: 100% 100%    Last Pain:  Vitals:   12/23/19 1300  TempSrc:   PainSc: 0-No pain                 Maryetta Shafer A.

## 2019-12-23 NOTE — Consult Note (Addendum)
Gastroenterology Inpatient Consultation   Attending Requesting Consult Charlynne Cousins, [facility] Day: 2  Reason for Consult Biliary Obstruction in setting of metastatic pancreatic cancer   History of Present Illness  [name] is a 86 y.o. female with a pmh significant for metastatic pancreatic cancer, anxiety/depression, gastritis, esophagitis, GERD, colon polyps, nephrolithiasis.  The GI service is consulted for evaluation and management of biliary obstruction in setting of metastatic pancreatic cancer.  Today, I performed an outpatient ERCP in an attempt of improvement in biliary obstruction.  She has had progressive liver metastases.  She has a large lesion in the region of the liver hilum which seems to have caused significant biliary obstruction.  Please see my ERCP note for full details of ERCP attempt.  Unfortunately, endoscopic biliary stenting is not possible due to angulation and distortion of the bifurcation.  Thus admission into the hospital for discussion with interventional radiology for potential percutaneous biliary drainage is necessary.  Post ERCP, the patient is doing well.  She denies any significant issues in regards to progressive abdominal pain or nausea or vomiting.  She has had some back pain since a fall a few days ago in the setting of some orthostasis.  She has had progressive jaundice and dark urine for the last 2 weeks.  GI Review of Systems Positive as above Negative for dysphagia, odynophagia, change in bowel habits, melena, hematochezia   Review of Systems  General: Positive for unintentional weight loss; denies fever/chills HEENT: Denies oral lesions Cardiovascular: Denies chest pain Pulmonary: Denies shortness of breath Gastroenterological: See HPI Genitourinary: Positive for darkened urine Hematological: Positive for easy bruising/bleeding that is longstanding Dermatological: Positive for jaundice Psychological: Mood is  anxious and scared that she needs more procedures   Histories  Past Medical History Past Medical History:  Diagnosis Date   Anemia due to antineoplastic chemotherapy 09/12/2015   Started Aranesp 500 mcg on 09/12/2015   Anxiety    Chronic diarrhea    Depression    DNR (do not resuscitate) 08/16/2015   Erosive esophagitis    Family history of breast cancer in female    Family history of prostate cancer    GERD (gastroesophageal reflux disease)    Hx of adenomatous colonic polyps    tubular adenomas, last found in 2008   Hyperplastic colon polyp 03/19/10   tcs by Dr. Gala Romney   Hypertension 20 years    Kidney stone    hx/ crushed    Opioid contract exists 04/18/2015   With Dr. Moshe Cipro   Pancreatic cancer [facility]) 02/2014   Pancreatic cancer metastasized to liver ([facility]) 03/10/2014   Schatzki's ring 08/26/10   Last dilated on EGD by Dr. Trevor Iha [facility], linear gastric erosions, BI hemigastrectomy   Past Surgical History:  Procedure Laterality Date   BREAST LUMPECTOMY Left    bunion removal     from both [place]    COLONOSCOPY  03/19/2010   DR Gala Romney,, normal TI, pancolonic diverticula, random colon bx neg., hyperplastic polyps removed   ESOPHAGOGASTRODUODENOSCOPY  11/22/2003   DR Gala Romney, erosive RE, Billroth I   ESOPHAGOGASTRODUODENOSCOPY  08/26/10   Dr. Gala Romney- moderate severe ERE, Scahtzki ring s/p dilation, Billroth I, linear gastric erosions, bx-gastric xanthelasma   LEFT SHOULDER SURGERY  2009   DR HARRISON   ORIF ANKLE FRACTURE Right 05/22/2013   Procedure: OPEN REDUCTION INTERNAL FIXATION (ORIF) RIGHT ANKLE FRACTURE;  Surgeon: Sanjuana Kava, MD;  Location: [facility];  Service: Orthopedics;  Laterality: Right;   stomach ulcer  50 years ago    had some of her stomach removed     Allergies Allergies  Allergen Reactions   Iohexol Hives and Shortness Of Breath    **Patient can not have IV contrast, pt has breakthrough reaction even after 13  hour premeds** Do not give IV contrast PATIENT HAD TO BE TAKEN TO ED, C/O WHELPS, HIVES, DIFFICULTY BREATHING. PATIENT GIVEN IV CONTRAST AFTER 13 HOUR PRE MEDS ON 01/09/2017 WITH NO REACTION NOTED. On 10/19/17 patient had reaction despite premedication.     Aciphex [Rabeprazole Sodium] Other (See Comments)    Unknown reaction   Amlodipine Besylate-Valsartan     Rash to high-dose (5/320), can take plain amlodipine    Esomeprazole Magnesium     Unknown reaction   Omeprazole Other (See Comments)    Patient states "medication didn't work"   Ciprofloxacin Rash   Penicillins Swelling and Rash    Has patient had a PCN reaction causing immediate rash, facial/tongue/throat swelling, SOB or lightheadedness with hypotension: Yes Has patient had a PCN reaction causing severe rash involving mucus membranes or skin necrosis: Yes Has patient had a PCN reaction that required hospitalization: No Has patient had a PCN reaction occurring within the last 10 years: yes If all of the above answers are "NO", then may proceed with Cephalosporin use.    Tessalon Perles [Benzonatate] Itching    Full head, face, neck, and chest itching.     Family History Family History  Problem Relation Age of Onset   Hypertension Mother    Kidney failure Brother        on dialysis   Diabetes Sister    Diabetes Sister    Hypertension Father    Kidney failure Sister    Kidney Stones Brother    Breast cancer Son 1   Gout Son 67   Gout Nephew 60   Liver disease Neg Hx    Colon cancer Neg Hx    The patient's FH is negative for IBD/IBS/Liver Disease/GI Malignancies.  Social History Social History   Socioeconomic History   Marital status: Widowed    Spouse name: Not on file   Number of children: 2   Years of education: 45   Highest education level: 12th grade  Occupational History   Occupation: retired from Estate manager/land agent: RETIRED  Tobacco Use   Smoking status: Current Some Day  Smoker    Packs/day: 0.50    Years: 60.00    Pack years: 30.00    Types: Cigarettes    Last attempt to quit: 09/09/2016    Years since quitting: 3.2   Smokeless tobacco: Never Used  Vaping Use   Vaping Use: Never used  Substance and Sexual Activity   Alcohol use: No   Drug use: No   Sexual activity: Not Currently  Other Topics Concern   Not on file  Social History Narrative   Lives with son alone    Social Determinants of Health   Financial Resource Strain:    Difficulty of Paying Living Expenses:   Food Insecurity:    Worried About Charity fundraiser in the Last Year:    Arboriculturist in the Last Year:   Transportation Needs:    Film/video editor (Medical):    Lack of Transportation (Non-Medical):   Physical Activity:    Days of Exercise per Week:    Minutes of Exercise per Session:  Stress:    Feeling of Stress :   Social Connections:    Frequency of Communication with Friends and Family:    Frequency of Social Gatherings with Friends and Family:    Attends Religious Services:    Active Member of Clubs or Organizations:    Attends Music therapist:    Marital Status:   Intimate Partner Violence:    Fear of Current or Ex-Partner:    Emotionally Abused:    Physically Abused:    Sexually Abused:     Medications  Home Medications No current facility-administered medications on file prior to encounter.   Current Outpatient Medications on File Prior to Encounter  Medication Sig Dispense Refill   amLODipine (NORVASC) 10 MG tablet TAKE 1 TABLET DAILY (Patient taking differently: Take 10 mg by mouth daily. ) 90 tablet 3   clonazePAM (KLONOPIN) 0.5 MG tablet Take 1 tablet (0.5 mg total) by mouth at bedtime. (Patient taking differently: Take 0.5 mg by mouth daily. ) 90 tablet 1   cycloSPORINE (RESTASIS) 0.05 % ophthalmic emulsion Place 1 drop into both eyes 2 (two) times daily.     ergocalciferol (VITAMIN D2) 1.25 MG  (50000 UT) capsule Take 1 capsule (50,000 Units total) by mouth once a week. (Patient taking differently: Take 50,000 Units by mouth every Wednesday. ) 4 capsule 3   fluticasone (CUTIVATE) 0.05 % cream Apply 1 application topically daily as needed (rash).      HYDROcodone-acetaminophen (NORCO) 5-325 MG tablet Take 1 tablet by mouth every 8 (eight) hours as needed for moderate pain. (Patient taking differently: Take 0.5 tablets by mouth every 8 (eight) hours as needed for moderate pain. ) 75 tablet 0   loperamide (IMODIUM A-D) 2 MG tablet Take 2 mg by mouth daily.      metoprolol tartrate (LOPRESSOR) 25 MG tablet TAKE 1 TABLET DAILY FOR BLOOD PRESSURE (DOSE REDUCTION) (Patient taking differently: Take 25 mg by mouth daily. ) 90 tablet 3   MYRBETRIQ 50 MG TB24 tablet Take 50 mg by mouth daily.     pantoprazole (PROTONIX) 20 MG tablet TAKE 1 TABLET DAILY (Patient taking differently: Take 20 mg by mouth daily. ) 90 tablet 3   potassium chloride (KLOR-CON) 10 MEQ tablet Take 2 tablets (20 mEq total) by mouth daily. 60 tablet 3   CISPLATIN IV Inject into the vein. Days 1, 8 every 21 days     GEMCITABINE HCL IV Inject into the vein. Days 1, 8 every 21 days     lidocaine-prilocaine (EMLA) cream Apply 1 application topically as needed. Apply to portacath site as needed (Patient taking differently: Apply 1 application topically daily as needed (port access). ) 30 g 1   oxybutynin (DITROPAN-XL) 10 MG 24 hr tablet TAKE 1 TABLET(5 MG) BY MOUTH AT BEDTIME 30 tablet 6   traMADol (ULTRAM) 50 MG tablet Take 1 tablet (50 mg total) by mouth 3 (three) times daily. 30 tablet 2   [DISCONTINUED] prochlorperazine (COMPAZINE) 10 MG tablet Take 1 tablet (10 mg total) by mouth every 6 (six) hours as needed (Nausea or vomiting). 30 tablet 1   Scheduled Inpatient Medications  Chlorhexidine Gluconate Cloth  6 each Topical Daily   clonazePAM  0.5 mg Oral Daily   cycloSPORINE  1 drop Both Eyes BID    diphenhydrAMINE  50 mg Oral Once   [START ON 12/25/2019] enoxaparin (LOVENOX) injection  30 mg Subcutaneous Q24H   famotidine  20 mg Oral Once   methylPREDNISolone  32 mg  Oral Daily   metoprolol tartrate  25 mg Oral Daily   mirabegron ER  25 mg Oral Daily   traMADol  50 mg Oral Q12H   Continuous Inpatient Infusions  sodium chloride     dextrose 5 % and 0.45 % NaCl with KCl 20 mEq/L 75 mL/hr at 12/23/19 1827   lactated ringers 20 mL/hr at 12/23/19 0835   meropenem (MERREM) IV 500 mg (12/23/19 2052)   PRN Inpatient Medications fentaNYL (SUBLIMAZE) injection, morphine injection, ondansetron **OR** ondansetron (ZOFRAN) IV, polyethylene glycol, sodium chloride flush   Physical Examination  BP (!) 142/64 (BP Location: Left Arm)    Pulse (!) 59    Temp 98 F (36.7 C)    Resp 16    Ht 5\' 3"  (1.6 m)    Wt 37.2 kg    SpO2 96%    BMI 14.53 kg/m  GEN: Elderly (looks younger than stated age however), no acute distress PSYCH: Cooperative, without pressured speech EYE: Icteric sclerae ENT: MMM NECK: Supple with some mild left neck soft tissue protuberance CV: Nontachycardic RESP: No wheezing GI: NABS, soft, NT/ND, without rebound or guarding MSK/EXT: Pain in the posterior thoracic back region; no lower extremity edema SKIN: Visibly jaundiced NEURO:  Alert & Oriented x 3, no focal deficits   Review of Data  I reviewed the following data at the time of this encounter:  Laboratory Studies   Recent Labs  Lab 12/22/19 0823 12/22/19 0823 12/23/19 1705  NA 136  --   --   K 4.6  --   --   CL 100  --   --   CO2 23  --   --   BUN 32*  --   --   CREATININE 1.41*   < > 1.19*  GLUCOSE 108*  --   --   CALCIUM 8.5*  --   --   MG 2.0  --   --    < > = values in this interval not displayed.   Recent Labs  Lab 12/22/19 0823  AST 97*  ALT 58*  ALKPHOS 257*    Recent Labs  Lab 12/20/19 0900 12/20/19 0900 12/22/19 0823 12/22/19 0823 12/23/19 1705  WBC 8.5   < > 9.6   < >  6.0  HGB 10.9*   < > 10.3*   < > 10.5*  HCT 31.4*   < > 30.2*   < > 32.0*  PLT 153.0  --  157  --  142*   < > = values in this interval not displayed.   Recent Labs  Lab 12/20/19 0900  INR 1.4*   Imaging Studies  December 16, 2019 MRCP IMPRESSION: 1. Interval enlargement of a hepatic metastasis. 2. Severe intrahepatic duct dilatation of the LEFT hepatic lobe ductal system. Obstruction appears at the level of the confluence of LEFT and RIGHT hepatic ducts. There is a liver metastasis at this level. Mild dilatation of the RIGHT ductal system. 3. No pancreatic lesion identified. 4. New nodule in the RIGHT lower lobe concerning for metastatic nodule.  GI Procedures and Studies  12/23/2019 ERCP attempt - A large amount of food (residue) in the stomach. - The major papilla appeared congested, edematous, and small. It was at an acute angulation. - Double wire technique and then pancreatic stent placement and precut sphincterotomy Goff technique required to enter the biliary tree. - Multiple severe biliary strictures were found in the CHD and the LHD and RHD. The strictures were malignant appearing -  consistent with know mass in that region. - Spyglass required for selective wire cannulation of the left hepatic duct system. - One temporary plastic pancreatic stent was placed into the ventral pancreatic duct to decrease PEP (but also used as noted above to aid in cannulation). - Unfortunately, the sphincterotomes and dilation balloons would not pass/traverse the strictures as a result of the significant angulation of the bifurcation. Endoscopic therapies are not possible with her anatomy.   Assessment  Ms. Croker is a 86 y.o. female with a pmh significant for metastatic pancreatic cancer, anxiety/depression, gastritis, esophagitis, GERD, colon polyps, nephrolithiasis.  The GI service is consulted for evaluation and management of biliary obstruction in setting of metastatic pancreatic  cancer.  Unfortunately, even after extensive attempt at ERCP as per my notation, successful stenting of the biliary system endoscopically is not possible as a result of the significant distortion and angulation at the bifurcation.  The inability to pass even dilating balloons or maintain ability of sphincterotome passage does not allow for adequate endoscopic stenting.  I suspect the left system being drained will give [place] a good opportunity to try to optimize things but certainly the right hepatic system also has some significant stricturing that is occurring as a result of that liver metastases.  Percutaneous biliary drainage is not going to be easy.  I have asked her interventional radiology colleagues to have the opportunity to review the case and talk with the patient and her family about potential therapeutic interventions that may be able to occur over the course the next few days.  Her ascites may make things more difficult for percutaneous drainage in the short-term but hopefully drainage could be obtained and subsequent internal drainage at some point could be considered.  She has a pancreatic stent for prophylaxis against post ERCP pancreatitis after today's extensive ability in trying to cannulate the biliary tree.  [facility] imaging will need to be performed in 2 weeks.  We will follow up with the patient tomorrow.  We appreciate the medical service for taking her case on.  I will update her primary oncologist Dr. Delton Coombes.  I had extensive discussions with the patient and patient's family (son and sister) prior to and after the ERCP attempt.   Plan/Recommendations  Appreciate medical service for admission into the hospital for further treatment/management Appreciate interventional radiology colleagues for evaluation of patient for role of percutaneous biliary drainage I will send a note to Dr. Delton Coombes to update him about our inability to endoscopically treat the patient's obstruction Monitor  closely for post ERCP pancreatitis complications Continue IV antibiotics in setting of biliary tree having been accessed and not having appropriate drainage in place as of yet (meropenem given during procedure and would likely continue unless medical service feels another new medication with the patient's allergies is possible)-needs to cover GNR's and anaerobes Continue IV fluids Advance diet as tolerated No blood thinners for at least 72 hours to decrease risk of post sphincterotomy bleeding PPI once daily in effort of trying to heal sphincterotomy site Consider chest x-ray as well as thoracic and lumbar films in regards to working up her back pain per primary service   Thank you for this consult.  We will continue to follow.  Please page/call with questions or concerns.   Justice Britain, MD [facility] # [phone]

## 2019-12-23 NOTE — Progress Notes (Signed)
Referring Physician(s): Mansouraty,G  Supervising Physician: Aletta Edouard  Patient Status:  [facility] - In-pt  Chief Complaint: Metastatic pancreatic cancer/biliary obstruction   Subjective: Patient familiar to IR service from liver lesion biopsy and Port-A-Cath placement in 2015.  She has a history of metastatic pancreatic cancer who presented to [facility] today for ERCP to attempt biliary decompression secondary to obstructing pancreatic mass.  ERCP findings today revealed multiple severe biliary strictures in the common hepatic duct, left and right hepatic ducts which appeared malignant in origin; temporary plastic pancreatic stent was placed into the ventral pancreatic duct to decrease PEP, unfortunately sphincterotomies and dilation balloons would not pass/traverse the strictures as a result of significant angulation of the bifurcation.  MRI abdomen/MRCP done on 8/6 revealed: . Interval enlargement of a hepatic metastasis. 2. Severe intrahepatic duct dilatation of the LEFT hepatic lobe ductal system. Obstruction appears at the level of the confluence of LEFT and RIGHT hepatic ducts. There is a liver metastasis at this level. Mild dilatation of the RIGHT ductal system. 3. No pancreatic lesion identified. 4. New nodule in the RIGHT lower lobe concerning for metastatic nodule.   Patient currently afebrile, WBC 9.6, hemoglobin 10.3, platelets 157 k, creatinine 1.41, PT 15.3, INR 1.4, t bili 12.4, COVID 19 neg ; patient with reported remote contrast dye allergy which according to son was a rash; she denies fever, headache, chest pain, dyspnea, cough, worsening abdominal pain, nausea, vomiting or bleeding.  She does have some back pain.  Request now received from GI service for PTC with biliary drain placement as well as possible paracentesis.  Additional history as below.  Past Medical History:  Diagnosis Date  . Anemia due to antineoplastic chemotherapy 09/12/2015   Started Aranesp 500  mcg on 09/12/2015  . Anxiety   . Chronic diarrhea   . Depression   . DNR (do not resuscitate) 08/16/2015  . Erosive esophagitis   . Family history of breast cancer in female   . Family history of prostate cancer   . GERD (gastroesophageal reflux disease)   . Hx of adenomatous colonic polyps    tubular adenomas, last found in 2008  . Hyperplastic colon polyp 03/19/10   tcs by Dr. Gala Romney  . Hypertension 20 years   . Kidney stone    hx/ crushed   . Opioid contract exists 04/18/2015   With Dr. Moshe Cipro  . Pancreatic cancer ([place]) 02/2014  . Pancreatic cancer metastasized to liver ([place]) 03/10/2014  . Schatzki's ring 08/26/10   Last dilated on EGD by Dr. Trevor Iha [facility], linear gastric erosions, BI hemigastrectomy   Past Surgical History:  Procedure Laterality Date  . BREAST LUMPECTOMY Left   . bunion removal     from both feet   . CHOLECYSTECTOMY  1965   . COLONOSCOPY  03/19/2010   DR Gala Romney,, normal TI, pancolonic diverticula, random colon bx neg., hyperplastic polyps removed  . ESOPHAGOGASTRODUODENOSCOPY  11/22/2003   DR Gala Romney, erosive RE, Billroth I  . ESOPHAGOGASTRODUODENOSCOPY  08/26/10   Dr. Gala Romney- moderate severe ERE, Scahtzki ring s/p dilation, Billroth I, linear gastric erosions, bx-gastric xanthelasma  . LEFT SHOULDER SURGERY  2009   DR HARRISON  . ORIF ANKLE FRACTURE Right 05/22/2013   Procedure: OPEN REDUCTION INTERNAL FIXATION (ORIF) RIGHT ANKLE FRACTURE;  Surgeon: Sanjuana Kava, MD;  Location: [facility];  Service: Orthopedics;  Laterality: Right;  . stomach ulcer  50 years ago    had some of her stomach removed  Allergies: Iohexol, Aciphex [rabeprazole sodium], Amlodipine besylate-valsartan, Esomeprazole magnesium, Omeprazole, Ciprofloxacin, Penicillins, and Tessalon perles [benzonatate]  Medications: Prior to Admission medications   Medication Sig Start Date End Date Taking? Authorizing Provider  amLODipine (NORVASC) 10 MG tablet TAKE 1 TABLET DAILY Patient taking  differently: Take 10 mg by mouth daily.  02/02/19  Yes Fayrene Helper, MD  clonazePAM (KLONOPIN) 0.5 MG tablet Take 1 tablet (0.5 mg total) by mouth at bedtime. Patient taking differently: Take 0.5 mg by mouth daily.  12/12/19  Yes Fayrene Helper, MD  cycloSPORINE (RESTASIS) 0.05 % ophthalmic emulsion Place 1 drop into both eyes 2 (two) times daily.   Yes [provider]  ergocalciferol (VITAMIN D2) 1.25 MG (50000 UT) capsule Take 1 capsule (50,000 Units total) by mouth once a week. Patient taking differently: Take 50,000 Units by mouth every Wednesday.  10/13/19  Yes Derek Jack, MD  fluticasone (CUTIVATE) 0.05 % cream Apply 1 application topically daily as needed (rash).  07/17/19  Yes [provider]  HYDROcodone-acetaminophen (NORCO) 5-325 MG tablet Take 1 tablet by mouth every 8 (eight) hours as needed for moderate pain. Patient taking differently: Take 0.5 tablets by mouth every 8 (eight) hours as needed for moderate pain.  12/14/19  Yes Lockamy, Randi L, NP-C  lidocaine (LIDODERM) 5 % Place 1 patch onto the skin daily. Remove & Discard patch within 12 hours or as directed by MD 12/22/19  Yes Derek Jack, MD  loperamide (IMODIUM A-D) 2 MG tablet Take 2 mg by mouth daily.    Yes [provider]  metoprolol tartrate (LOPRESSOR) 25 MG tablet TAKE 1 TABLET DAILY FOR BLOOD PRESSURE (DOSE REDUCTION) Patient taking differently: Take 25 mg by mouth daily.  04/11/19  Yes Fayrene Helper, MD  MYRBETRIQ 50 MG TB24 tablet Take 50 mg by mouth daily. 09/26/19  Yes [provider]  pantoprazole (PROTONIX) 20 MG tablet TAKE 1 TABLET DAILY Patient taking differently: Take 20 mg by mouth daily.  08/10/19  Yes Fayrene Helper, MD  potassium chloride (KLOR-CON) 10 MEQ tablet Take 2 tablets (20 mEq total) by mouth daily. 12/08/19  Yes Derek Jack, MD  CISPLATIN IV Inject into the vein. Days 1, 8 every 21 days 02/10/19   [provider]   GEMCITABINE HCL IV Inject into the vein. Days 1, 8 every 21 days 02/10/19   [provider]  lidocaine-prilocaine (EMLA) cream Apply 1 application topically as needed. Apply to portacath site as needed Patient taking differently: Apply 1 application topically daily as needed (port access).  03/11/19   Derek Jack, MD  oxybutynin (DITROPAN-XL) 10 MG 24 hr tablet TAKE 1 TABLET(5 MG) BY MOUTH AT BEDTIME 10/13/19   Derek Jack, MD  traMADol (ULTRAM) 50 MG tablet Take 1 tablet (50 mg total) by mouth 3 (three) times daily. 10/27/19   Lockamy, Randi L, NP-C  prochlorperazine (COMPAZINE) 10 MG tablet Take 1 tablet (10 mg total) by mouth every 6 (six) hours as needed (Nausea or vomiting). 09/18/16 10/21/17  Holley Bouche, NP     Vital Signs: BP (!) 176/79 (BP Location: Right Arm)   Pulse (!) 58   Temp 97.7 F (36.5 C)   Resp 14   Ht '5\' 3"'  (1.6 m)   Wt 82 lb (37.2 kg)   SpO2 100%   BMI 14.53 kg/m   Physical Exam awake, alert.  cachectic appearing ; chest clear to auscultation bilaterally.  Clean, intact right chest wall Port-A-Cath.  Heart with regular  rate and rhythm.  Abdomen soft, positive bowel sounds, nontender.  No lower extremity edema.  Imaging: No results found.  Labs:  CBC: Recent Labs    11/24/19 0815 12/08/19 0817 12/20/19 0900 12/22/19 0823  WBC 7.5 8.1 8.5 9.6  HGB 9.9* 9.8* 10.9* 10.3*  HCT 29.3* 28.2* 31.4* 30.2*  PLT 155 149* 153.0 157    COAGS: Recent Labs    12/20/19 0900  INR 1.4*    BMP: Recent Labs    11/10/19 0837 11/10/19 0837 11/24/19 0815 12/08/19 0817 12/20/19 0900 12/22/19 0823  NA 134*   < > 135 135 136 136  K 3.5   < > 3.0* 2.6* 4.3 4.6  CL 93*   < > 98 93* 99 100  CO2 30   < > '27 30 25 23  ' GLUCOSE 98   < > 107* 114* 128* 108*  BUN 21   < > 19 22 27* 32*  CALCIUM 8.1*   < > 8.3* 7.6* 8.8 8.5*  CREATININE 1.80*   < > 1.21* 1.25* 1.59* 1.41*  GFRNONAA 25*  --  40* 38*  --  33*  GFRAA 29*  --  46* 44*  --   38*   < > = values in this interval not displayed.    LIVER FUNCTION TESTS: Recent Labs    11/24/19 0815 12/08/19 0817 12/20/19 0900 12/22/19 0823  BILITOT 2.5* 7.9* 11.5* 12.4*  AST 132* 95* 81* 97*  ALT 91* 51* 53* 58*  ALKPHOS 221* 206* 250* 257*  PROT 7.2 7.0 7.7 7.6  ALBUMIN 3.2* 2.6* 3.0* 2.6*    Assessment and Plan: 86 yo female with history of metastatic pancreatic cancer (diagnosed 2015) who presented to [facility] today for ERCP to attempt biliary decompression secondary to obstructing pancreatic mass.  ERCP findings today revealed multiple severe biliary strictures in the common hepatic duct, left and right hepatic ducts which appeared malignant in origin; temporary plastic pancreatic stent was placed into the ventral pancreatic duct to decrease PEP, unfortunately sphincterotomies and dilation balloons would not pass/traverse the strictures as a result of significant angulation of the bifurcation.  MRI abdomen/MRCP done on 8/6 revealed: . Interval enlargement of a hepatic metastasis. 2. Severe intrahepatic duct dilatation of the LEFT hepatic lobe ductal system. Obstruction appears at the level of the confluence of LEFT and RIGHT hepatic ducts. There is a liver metastasis at this level. Mild dilatation of the RIGHT ductal system. 3. No pancreatic lesion identified. 4. New nodule in the RIGHT lower lobe concerning for metastatic nodule.   Patient currently afebrile, WBC 9.6, hemoglobin 10.3, platelets 157 k, creatinine 1.41, PT 15.3, INR 1.4, t bili 12.4, COVID 19 neg ; patient with reported remote contrast dye allergy which according to son was a rash- will order 13 hr prep; request now received from GI service for PTC with biliary drain placement as well as possible paracentesis.  Imaging studies were reviewed by Dr. Kathlene Cote.  Details/risks of procedure, including but not limited to, internal bleeding, infection, injury to adjacent structures discussed with patient and son  with their understanding and consent.  Procedure tentatively scheduled for tomorrow morning.   Electronically Signed: D. Rowe Robert, PA-C 12/23/2019, 4:47 PM   I spent a total of 30 minutes at the the patient's bedside AND on the patient's hospital floor or unit, greater than 50% of which was counseling/coordinating care for percutaneous transhepatic cholangiogram with biliary drain placement and possible paracentesis.    Patient ID: [name],  female   DOB: Mar 19, 1929, 86 y.o.   MRN: [id number]

## 2019-12-23 NOTE — Transfer of Care (Signed)
Immediate Anesthesia Transfer of Care Note  Patient: [name]  Procedure(s) Performed: ENDOSCOPIC RETROGRADE CHOLANGIOPANCREATOGRAPHY (ERCP) WITH PROPOFOL (N/A ) SPHINCTEROTOMY PANCREATIC STENT PLACEMENT  Patient Location: PACU  Anesthesia Type:General  Level of Consciousness: sedated, patient cooperative and responds to stimulation  Airway & Oxygen Therapy: Patient Spontanous Breathing and Patient connected to face mask oxygen  Post-op Assessment: Report given to RN and Post -op Vital signs reviewed and stable  Post vital signs: Reviewed and stable  Last Vitals:  Vitals Value Taken Time  BP 184/74 12/23/19 1237  Temp    Pulse 57 12/23/19 1239  Resp 11 12/23/19 1239  SpO2 100 % 12/23/19 1239  Vitals shown include unvalidated device data.  Last Pain:  Vitals:   12/23/19 1237  TempSrc:   PainSc: 0-No pain         Complications: No complications documented.

## 2019-12-23 NOTE — Progress Notes (Signed)
Pharmacy Antibiotic Note  [name] is a 86 y.o. female admitted on 12/23/2019 with metastatic pancreatic cancer with malignant biliary obstruction. Unsuccessful ERCP attempt at biliary decompression. IR consulted and planning for PTC with biliary drain placement tomorrow. Pharmacy has been consulted for Meropenem dosing, as patient at risk for cholangitis. Noted penicillin allergy-tolerated dose of Meropenem earlier today prior to ERCP.   Plan: Meropenem 564m IV q12h  Monitor renal function, cultures as available, clinical course  Height: '5\' 3"'  (160 cm) Weight: 37.2 kg (82 lb) IBW/kg (Calculated) : 52.4  Temp (24hrs), Avg:97.8 F (36.6 C), Min:97.7 F (36.5 C), Max:97.8 F (36.6 C)  Recent Labs  Lab 12/20/19 0900 12/22/19 0823  WBC 8.5 9.6  CREATININE 1.59* 1.41*    Estimated Creatinine Clearance: 15.9 mL/min (A) (by C-G formula based on SCr of 1.41 mg/dL (H)).    Allergies  Allergen Reactions  . Iohexol Hives and Shortness Of Breath    **Patient can not have IV contrast, pt has breakthrough reaction even after 13 hour premeds** Do not give IV contrast PATIENT HAD TO BE TAKEN TO ED, C/O WHELPS, HIVES, DIFFICULTY BREATHING. PATIENT GIVEN IV CONTRAST AFTER 13 HOUR PRE MEDS ON 01/09/2017 WITH NO REACTION NOTED. On 10/19/17 patient had reaction despite premedication.    .Marland KitchenAciphex [Rabeprazole Sodium] Other (See Comments)    Unknown reaction  . Amlodipine Besylate-Valsartan     Rash to high-dose (5/320), can take plain amlodipine   . Esomeprazole Magnesium     Unknown reaction  . Omeprazole Other (See Comments)    Patient states "medication didn't work"  . Ciprofloxacin Rash  . Penicillins Swelling and Rash    Has patient had a PCN reaction causing immediate rash, facial/tongue/throat swelling, SOB or lightheadedness with hypotension: Yes Has patient had a PCN reaction causing severe rash involving mucus membranes or skin necrosis: Yes Has patient had a PCN reaction that  required hospitalization: No Has patient had a PCN reaction occurring within the last 10 years: yes If all of the above answers are "NO", then may proceed with Cephalosporin use.   .[name][Benzonatate] Itching    Full head, face, neck, and chest itching.     Antimicrobials this admission: 8/13 Meropenem >>  Dose adjustments this admission: --  Microbiology results: None ordered  Thank you for allowing pharmacy to be a part of this patient's care.   JLindell Spar PharmD, BCPS Clinical Pharmacist  12/23/2019 5:10 PM

## 2019-12-23 NOTE — H&P (Signed)
GASTROENTEROLOGY PROCEDURE H&P NOTE   Primary Care Physician: Fayrene Helper, MD  HPI: [name] is a 86 y.o. female who presents for ERCP attempt at biliary decompression in setting of metastatic pancreatic cancer.  Past Medical History:  Diagnosis Date  . Anemia due to antineoplastic chemotherapy 09/12/2015   Started Aranesp 500 mcg on 09/12/2015  . Anxiety   . Chronic diarrhea   . Depression   . DNR (do not resuscitate) 08/16/2015  . Erosive esophagitis   . Family history of breast cancer in female   . Family history of prostate cancer   . GERD (gastroesophageal reflux disease)   . Hx of adenomatous colonic polyps    tubular adenomas, last found in 2008  . Hyperplastic colon polyp 03/19/10   tcs by Dr. Gala Romney  . Hypertension 20 years   . Kidney stone    hx/ crushed   . Opioid contract exists 04/18/2015   With Dr. Moshe Cipro  . Pancreatic cancer ([place]) 02/2014  . Pancreatic cancer metastasized to liver ([place]) 03/10/2014  . Schatzki's ring 08/26/10   Last dilated on EGD by Dr. Trevor Iha [facility], linear gastric erosions, BI hemigastrectomy   Past Surgical History:  Procedure Laterality Date  . BREAST LUMPECTOMY Left   . bunion removal     from both feet   . CHOLECYSTECTOMY  1965   . COLONOSCOPY  03/19/2010   DR Gala Romney,, normal TI, pancolonic diverticula, random colon bx neg., hyperplastic polyps removed  . ESOPHAGOGASTRODUODENOSCOPY  11/22/2003   DR Gala Romney, erosive RE, Billroth I  . ESOPHAGOGASTRODUODENOSCOPY  08/26/10   Dr. Gala Romney- moderate severe ERE, Scahtzki ring s/p dilation, Billroth I, linear gastric erosions, bx-gastric xanthelasma  . LEFT SHOULDER SURGERY  2009   DR HARRISON  . ORIF ANKLE FRACTURE Right 05/22/2013   Procedure: OPEN REDUCTION INTERNAL FIXATION (ORIF) RIGHT ANKLE FRACTURE;  Surgeon: Sanjuana Kava, MD;  Location: [facility];  Service: Orthopedics;  Laterality: Right;  . stomach ulcer  50 years ago    had some of her stomach removed    Current  Facility-Administered Medications  Medication Dose Route Frequency Provider Last Rate Last Admin  . 0.9 %  sodium chloride infusion   Intravenous Continuous Mansouraty, Telford Nab., MD      . lactated ringers infusion   Intravenous Continuous Mansouraty, Telford Nab., MD 20 mL/hr at 12/23/19 0804 1,000 mL at 12/23/19 0804  . meropenem (MERREM) 1 g in sodium chloride 0.9 % 100 mL IVPB  1 g Intravenous Once Mansouraty, Telford Nab., MD       Allergies  Allergen Reactions  . Iohexol Hives and Shortness Of Breath    **Patient can not have IV contrast, pt has breakthrough reaction even after 13 hour premeds** Do not give IV contrast PATIENT HAD TO BE TAKEN TO ED, C/O WHELPS, HIVES, DIFFICULTY BREATHING. PATIENT GIVEN IV CONTRAST AFTER 13 HOUR PRE MEDS ON 01/09/2017 WITH NO REACTION NOTED. On 10/19/17 patient had reaction despite premedication.    Marland Kitchen Aciphex [Rabeprazole Sodium] Other (See Comments)    Unknown reaction  . Amlodipine Besylate-Valsartan     Rash to high-dose (5/320), can take plain amlodipine   . Esomeprazole Magnesium     Unknown reaction  . Omeprazole Other (See Comments)    Patient states "medication didn't work"  . Ciprofloxacin Rash  . Penicillins Swelling and Rash    Has patient had a PCN reaction causing immediate rash, facial/tongue/throat swelling, SOB or lightheadedness with hypotension: Yes Has patient had a PCN reaction  causing severe rash involving mucus membranes or skin necrosis: Yes Has patient had a PCN reaction that required hospitalization: No Has patient had a PCN reaction occurring within the last 10 years: yes If all of the above answers are "NO", then may proceed with Cephalosporin use.   [name] [Benzonatate] Itching    Full head, face, neck, and chest itching.    Family History  Problem Relation Age of Onset  . Hypertension Mother   . Kidney failure Brother        on dialysis  . Diabetes Sister   . Diabetes Sister   . Hypertension Father    . Kidney failure Sister   . Kidney Stones Brother   . Breast cancer Son 18  . Gout Son 22  . Gout Nephew 57  . Liver disease Neg Hx   . Colon cancer Neg Hx    Social History   Socioeconomic History  . Marital status: Widowed    Spouse name: Not on file  . Number of children: 2  . Years of education: 39  . Highest education level: 12th grade  Occupational History  . Occupation: retired from Estate manager/land agent: RETIRED  Tobacco Use  . Smoking status: Current Some Day Smoker    Packs/day: 0.50    Years: 60.00    Pack years: 30.00    Types: Cigarettes    Last attempt to quit: 09/09/2016    Years since quitting: 3.2  . Smokeless tobacco: Never Used  Vaping Use  . Vaping Use: Never used  Substance and Sexual Activity  . Alcohol use: No  . Drug use: No  . Sexual activity: Not Currently  Other Topics Concern  . Not on file  Social History Narrative   Lives with son alone    Social Determinants of Health   Financial Resource Strain:   . Difficulty of Paying Living Expenses:   Food Insecurity:   . Worried About Charity fundraiser in the Last Year:   . Arboriculturist in the Last Year:   Transportation Needs:   . Film/video editor (Medical):   Marland Kitchen Lack of Transportation (Non-Medical):   Physical Activity:   . Days of Exercise per Week:   . Minutes of Exercise per Session:   Stress:   . Feeling of Stress :   Social Connections:   . Frequency of Communication with Friends and Family:   . Frequency of Social Gatherings with Friends and Family:   . Attends Religious Services:   . Active Member of Clubs or Organizations:   . Attends Archivist Meetings:   Marland Kitchen Marital Status:   Intimate Partner Violence:   . Fear of Current or Ex-Partner:   . Emotionally Abused:   Marland Kitchen Physically Abused:   . Sexually Abused:     Physical Exam: Vital signs in last 24 hours: Temp:  [97.8 F (36.6 C)] 97.8 F (36.6 C) (08/13 0745) Pulse Rate:  [59] 59 (08/13 0745) Resp:   [18] 18 (08/13 0745) BP: (185)/(73) 185/73 (08/13 0745) SpO2:  [100 %] 100 % (08/13 0745) Weight:  [37.2 kg] 37.2 kg (08/13 0745)   GEN: NAD EYE: Sclerae anicteric ENT: MMM CV: Non-tachycardic GI: Soft, TTP in midabdomen noted (3-4/10) MSK: Tenderness in lower back and lower thoracic region (recent fall) NEURO:  Alert & Oriented x 3  Lab Results: Recent Labs    12/20/19 0900 12/22/19 0823  WBC 8.5 9.6  HGB 10.9* 10.3*  HCT 31.4* 30.2*  PLT 153.0 157   BMET Recent Labs    12/20/19 0900 12/22/19 0823  NA 136 136  K 4.3 4.6  CL 99 100  CO2 25 23  GLUCOSE 128* 108*  BUN 27* 32*  CREATININE 1.59* 1.41*  CALCIUM 8.8 8.5*   LFT Recent Labs    12/22/19 0823  PROT 7.6  ALBUMIN 2.6*  AST 97*  ALT 58*  ALKPHOS 257*  BILITOT 12.4*   PT/INR Recent Labs    12/20/19 0900  LABPROT 15.3*  INR 1.4*     Impression / Plan: This is a 86 y.o.female who presents for ERCP attempt at biliary decompression in setting of metastatic pancreatic cancer.  The risks of an ERCP were discussed at length, including but not limited to the risk of perforation, bleeding, abdominal pain, post-ERCP pancreatitis (while usually mild can be severe and even life threatening).  The risks and benefits of endoscopic evaluation were discussed with the patient; these include but are not limited to the risk of perforation, infection, bleeding, missed lesions, lack of diagnosis, severe illness requiring hospitalization, as well as anesthesia and sedation related illnesses.    The patient has a reported allergy to IV contrast.  Will plan to proceed with IV Solumedrol and Benadryl as effort to decrease potential effect (has been discussed with Anesthesia it was rash rather than anaphylaxis).    Patient has multiple allergies to antibiotics, will need Carbapenem due to her allergies.  For post-procedure ABx will consider Bactrim or Doxycycline use.  If failed cannulation or stenting will likely need  Admission for IR evaluation and possible PBD vs repeat ERCP attempt.  Patient, son, sister all aware and agree with plan of action (I personally called the son and sister this AM to go over things to ensure they had an understanding of today's procedure).   Justice Britain, MD [facility] Office # [phone]

## 2019-12-23 NOTE — Anesthesia Preprocedure Evaluation (Addendum)
Anesthesia Evaluation  Patient identified by MRN, date of birth, ID band Patient awake    Reviewed: Allergy & Precautions, NPO status , Patient's Chart, lab work & pertinent test results, reviewed documented beta blocker date and time   Airway Mallampati: I  TM Distance: >3 FB Neck ROM: Full    Dental  (+) Upper Dentures, Missing, Caps,    Pulmonary Current Smoker and Patient abstained from smoking.,    Pulmonary exam normal breath sounds clear to auscultation       Cardiovascular hypertension, Pt. on medications and Pt. on home beta blockers Normal cardiovascular exam Rhythm:Regular Rate:Normal     Neuro/Psych PSYCHIATRIC DISORDERS Anxiety Depression Chemotherapy induced peripheral neuropathy  Neuromuscular disease    GI/Hepatic PUD, GERD  Medicated and Controlled,Hyperbilirubinemia Pancreatic Ca with metastasis to liver  Hx/o colon polyps Hx/o erosive esophagitis   Endo/Other  Malnutrition- severe  Renal/GU Renal InsufficiencyRenal diseaseHx/o nephrolithiasis  negative genitourinary   Musculoskeletal  (+) Arthritis , Osteoarthritis,    Abdominal   Peds  Hematology  (+) anemia , Thrombocytopenia- chemotherapy induced   Anesthesia Other Findings   Reproductive/Obstetrics                           Anesthesia Physical Anesthesia Plan  ASA: III  Anesthesia Plan: General   Post-op Pain Management:    Induction: Intravenous, Rapid sequence and Cricoid pressure planned  PONV Risk Score and Plan: 4 or greater and Ondansetron and Treatment may vary due to age or medical condition  Airway Management Planned: Oral ETT  Additional Equipment:   Intra-op Plan:   Post-operative Plan: Extubation in OR  Informed Consent: I have reviewed the patients History and Physical, chart, labs and discussed the procedure including the risks, benefits and alternatives for the proposed anesthesia with  the patient or authorized representative who has indicated his/her understanding and acceptance.   Patient has DNR.  Discussed DNR with patient and Suspend DNR.   Dental advisory given  Plan Discussed with: CRNA and Anesthesiologist  Anesthesia Plan Comments:         Anesthesia Quick Evaluation

## 2019-12-23 NOTE — H&P (Addendum)
History and Physical  [name] BWG:[id number] DOB: 12-Jul-1928 DOA: 12/23/2019  PCP: Fayrene Helper, MD Patient coming from: Home  I have personally briefly reviewed patient's old medical records in [place]   Chief Complaint: Biliary stent decompression for obstructing pancreatic mass  HPI: [name] is a 86 y.o. female past medical history of metastatic pancreatic cancer with metastases to the liver, anemia due to antineoplastic chemotherapy on Aranesp a DO NOT RESUSCITATE with the last chemotherapy on 09/22/2018 who was admitted to [facility] endoscopy suite for an ERCP for attempt biliary decompression in the setting of metastatic pancreatic cancer.   Review of Systems: All systems reviewed and apart from history of presenting illness, are negative.  Past Medical History:  Diagnosis Date  . Anemia due to antineoplastic chemotherapy 09/12/2015   Started Aranesp 500 mcg on 09/12/2015  . Anxiety   . Chronic diarrhea   . Depression   . DNR (do not resuscitate) 08/16/2015  . Erosive esophagitis   . Family history of breast cancer in female   . Family history of prostate cancer   . GERD (gastroesophageal reflux disease)   . Hx of adenomatous colonic polyps    tubular adenomas, last found in 2008  . Hyperplastic colon polyp 03/19/10   tcs by Dr. Gala Romney  . Hypertension 20 years   . Kidney stone    hx/ crushed   . Opioid contract exists 04/18/2015   With Dr. Moshe Cipro  . Pancreatic cancer ([place]) 02/2014  . Pancreatic cancer metastasized to liver ([place]) 03/10/2014  . Schatzki's ring 08/26/10   Last dilated on EGD by Dr. Trevor Iha [facility], linear gastric erosions, BI hemigastrectomy   Past Surgical History:  Procedure Laterality Date  . BREAST LUMPECTOMY Left   . bunion removal     from both feet   . CHOLECYSTECTOMY  1965   . COLONOSCOPY  03/19/2010   DR Gala Romney,, normal TI, pancolonic diverticula, random colon bx neg., hyperplastic polyps removed  .  ESOPHAGOGASTRODUODENOSCOPY  11/22/2003   DR Gala Romney, erosive RE, Billroth I  . ESOPHAGOGASTRODUODENOSCOPY  08/26/10   Dr. Gala Romney- moderate severe ERE, Scahtzki ring s/p dilation, Billroth I, linear gastric erosions, bx-gastric xanthelasma  . LEFT SHOULDER SURGERY  2009   DR HARRISON  . ORIF ANKLE FRACTURE Right 05/22/2013   Procedure: OPEN REDUCTION INTERNAL FIXATION (ORIF) RIGHT ANKLE FRACTURE;  Surgeon: Sanjuana Kava, MD;  Location: [facility];  Service: Orthopedics;  Laterality: Right;  . stomach ulcer  50 years ago    had some of her stomach removed    Social History:  reports that she has been smoking cigarettes. She has a 30.00 pack-year smoking history. She has never used smokeless tobacco. She reports that she does not drink alcohol and does not use drugs.   Allergies  Allergen Reactions  . Iohexol Hives and Shortness Of Breath    **Patient can not have IV contrast, pt has breakthrough reaction even after 13 hour premeds** Do not give IV contrast PATIENT HAD TO BE TAKEN TO ED, C/O WHELPS, HIVES, DIFFICULTY BREATHING. PATIENT GIVEN IV CONTRAST AFTER 13 HOUR PRE MEDS ON 01/09/2017 WITH NO REACTION NOTED. On 10/19/17 patient had reaction despite premedication.    Marland Kitchen Aciphex [Rabeprazole Sodium] Other (See Comments)    Unknown reaction  . Amlodipine Besylate-Valsartan     Rash to high-dose (5/320), can take plain amlodipine   . Esomeprazole Magnesium     Unknown reaction  . Omeprazole Other (See Comments)  Patient states "medication didn't work"  . Ciprofloxacin Rash  . Penicillins Swelling and Rash    Has patient had a PCN reaction causing immediate rash, facial/tongue/throat swelling, SOB or lightheadedness with hypotension: Yes Has patient had a PCN reaction causing severe rash involving mucus membranes or skin necrosis: Yes Has patient had a PCN reaction that required hospitalization: No Has patient had a PCN reaction occurring within the last 10 years: yes If all of the above answers  are "NO", then may proceed with Cephalosporin use.   [name] [Benzonatate] Itching    Full head, face, neck, and chest itching.     Family History  Problem Relation Age of Onset  . Hypertension Mother   . Kidney failure Brother        on dialysis  . Diabetes Sister   . Diabetes Sister   . Hypertension Father   . Kidney failure Sister   . Kidney Stones Brother   . Breast cancer Son 67  . Gout Son 58  . Gout Nephew 57  . Liver disease Neg Hx   . Colon cancer Neg Hx      Prior to Admission medications   Medication Sig Start Date End Date Taking? Authorizing Provider  amLODipine (NORVASC) 10 MG tablet TAKE 1 TABLET DAILY Patient taking differently: Take 10 mg by mouth daily.  02/02/19  Yes Fayrene Helper, MD  clonazePAM (KLONOPIN) 0.5 MG tablet Take 1 tablet (0.5 mg total) by mouth at bedtime. Patient taking differently: Take 0.5 mg by mouth daily.  12/12/19  Yes Fayrene Helper, MD  cycloSPORINE (RESTASIS) 0.05 % ophthalmic emulsion Place 1 drop into both eyes 2 (two) times daily.   Yes [provider]  ergocalciferol (VITAMIN D2) 1.25 MG (50000 UT) capsule Take 1 capsule (50,000 Units total) by mouth once a week. Patient taking differently: Take 50,000 Units by mouth every Wednesday.  10/13/19  Yes Derek Jack, MD  fluticasone (CUTIVATE) 0.05 % cream Apply 1 application topically daily as needed (rash).  07/17/19  Yes [provider]  HYDROcodone-acetaminophen (NORCO) 5-325 MG tablet Take 1 tablet by mouth every 8 (eight) hours as needed for moderate pain. Patient taking differently: Take 0.5 tablets by mouth every 8 (eight) hours as needed for moderate pain.  12/14/19  Yes Lockamy, Randi L, NP-C  lidocaine (LIDODERM) 5 % Place 1 patch onto the skin daily. Remove & Discard patch within 12 hours or as directed by MD 12/22/19  Yes Derek Jack, MD  loperamide (IMODIUM A-D) 2 MG tablet Take 2 mg by mouth daily.    Yes [provider]   metoprolol tartrate (LOPRESSOR) 25 MG tablet TAKE 1 TABLET DAILY FOR BLOOD PRESSURE (DOSE REDUCTION) Patient taking differently: Take 25 mg by mouth daily.  04/11/19  Yes Fayrene Helper, MD  MYRBETRIQ 50 MG TB24 tablet Take 50 mg by mouth daily. 09/26/19  Yes [provider]  pantoprazole (PROTONIX) 20 MG tablet TAKE 1 TABLET DAILY Patient taking differently: Take 20 mg by mouth daily.  08/10/19  Yes Fayrene Helper, MD  potassium chloride (KLOR-CON) 10 MEQ tablet Take 2 tablets (20 mEq total) by mouth daily. 12/08/19  Yes Derek Jack, MD  CISPLATIN IV Inject into the vein. Days 1, 8 every 21 days 02/10/19   [provider]  GEMCITABINE HCL IV Inject into the vein. Days 1, 8 every 21 days 02/10/19   [provider]  lidocaine-prilocaine (EMLA) cream Apply 1 application topically as needed. Apply  to portacath site as needed Patient taking differently: Apply 1 application topically daily as needed (port access).  03/11/19   Derek Jack, MD  oxybutynin (DITROPAN-XL) 10 MG 24 hr tablet TAKE 1 TABLET(5 MG) BY MOUTH AT BEDTIME 10/13/19   Derek Jack, MD  traMADol (ULTRAM) 50 MG tablet Take 1 tablet (50 mg total) by mouth 3 (three) times daily. 10/27/19   Lockamy, Randi L, NP-C  prochlorperazine (COMPAZINE) 10 MG tablet Take 1 tablet (10 mg total) by mouth every 6 (six) hours as needed (Nausea or vomiting). 09/18/16 10/21/17  Holley Bouche, NP   Physical Exam: Vitals:   12/23/19 1237 12/23/19 1240 12/23/19 1250 12/23/19 1300  BP: (!) 184/74 (!) 188/75 (!) 184/75 (!) 188/72  Pulse: (!) 57 (!) 57 (!) 57 (!) 57  Resp: 12 11 12 12   Temp:      TempSrc:      SpO2: 100% 100% 100% 100%  Weight:      Height:         General exam: Extremely cachectic and sedated  Head, eyes and ENT: Nontraumatic and normocephalic.  Sunken eyes and dry mucous membrane  Neck: Supple. No JVD, carotid bruit or thyromegaly.  Lymphatics: No  lymphadenopathy.  Respiratory system: Clear to auscultation. No increased work of breathing.  Cardiovascular system: S1 and S2 heard, RRR. No JVD, murmurs, gallops, clicks or pedal edema.  Gastrointestinal system: Positive bowel sounds soft nontender nondistended  Central nervous system: Not able to follow commands only responsive to name and touch  Extremities: Symmetric 5 x 5 power. Peripheral pulses symmetrically felt.   Skin: No rashes or acute findings.  Musculoskeletal system: Negative exam.  Labs on Admission:  Basic Metabolic Panel: Recent Labs  Lab 12/20/19 0900 12/22/19 0823  NA 136 136  K 4.3 4.6  CL 99 100  CO2 25 23  GLUCOSE 128* 108*  BUN 27* 32*  CREATININE 1.59* 1.41*  CALCIUM 8.8 8.5*  MG  --  2.0   Liver Function Tests: Recent Labs  Lab 12/20/19 0900 12/22/19 0823  AST 81* 97*  ALT 53* 58*  ALKPHOS 250* 257*  BILITOT 11.5* 12.4*  PROT 7.7 7.6  ALBUMIN 3.0* 2.6*   No results for input(s): LIPASE, AMYLASE in the last 168 hours. No results for input(s): AMMONIA in the last 168 hours. CBC: Recent Labs  Lab 12/20/19 0900 12/22/19 0823  WBC 8.5 9.6  NEUTROABS 6.2 8.1*  HGB 10.9* 10.3*  HCT 31.4* 30.2*  MCV 98.8 97.7  PLT 153.0 157   Cardiac Enzymes: No results for input(s): CKTOTAL, CKMB, CKMBINDEX, TROPONINI in the last 168 hours.  BNP (last 3 results) No results for input(s): PROBNP in the last 8760 hours. CBG: No results for input(s): GLUCAP in the last 168 hours.  Radiological Exams on Admission: No results found.  EKG: Independently reviewed.  None  Assessment/Plan Terminal Pancreatic cancer with metastasized to liver [facility]) with malignant biliary obstruction: GI attempted ERCP with stenting for biliary decompression which was unsuccessful. The patient will be admitted to the hospital and we have consulted IR for PBDs which is not emergent. IR has already been consulted Dr. Quita Skye Hens She will be started on carbapenems for  gram-negative rods and anaerobic coverage. She is at high risk of pancreatitis.  Will monitor closely for symptomatology abdominal pain nausea vomiting and pain radiating to the back. Use narcotics for pain.  Anemia due to antineoplastic chemotherapy Globin at baseline at 10 we will continue to monitor intermittently.  Palliative care encounter Patient has an extremely poor prognosis she has been diagnosed with pancreatic cancer for several years and has been on chemotherapy which her last one was in 2020 I have been unsuccessful in trying to reach the son, will have to consult Pallitive Care to discuss end-of-life. To discuss with family moving towards comfort care. I have been unable to reach the son  DVT Prophylaxis: lovenox Code Status: DNR  Family Communication: sON  Disposition Plan: INPATIENT     It is my clinical opinion that admission to INPATIENT is reasonable and necessary in this 86 y.o. female with terminal metastatic cancer admitted for percutaneous biliary drain  Given the aforementioned, the predictability of an adverse outcome is felt to be significant. I expect that the patient will require at least 2 midnights in the hospital to treat this condition.  Charlynne Cousins MD [facility]   12/23/2019, 1:14 PM

## 2019-12-23 NOTE — Anesthesia Procedure Notes (Signed)
Procedure Name: Intubation Performed by: Diyana Starrett J, CRNA Pre-anesthesia Checklist: Patient identified, Emergency Drugs available, Suction available, Patient being monitored and Timeout performed Patient Re-evaluated:Patient Re-evaluated prior to induction Oxygen Delivery Method: Circle system utilized Preoxygenation: Pre-oxygenation with 100% oxygen Induction Type: IV induction and Rapid sequence Laryngoscope Size: Mac and 3 Grade View: Grade I Tube type: Oral Tube size: 7.0 mm Number of attempts: 1 Airway Equipment and Method: Stylet Placement Confirmation: ETT inserted through vocal cords under direct vision,  positive ETCO2 and breath sounds checked- equal and bilateral Secured at: 21 cm Tube secured with: Tape Dental Injury: Teeth and Oropharynx as per pre-operative assessment        

## 2019-12-23 NOTE — Op Note (Signed)
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 Estimated Blood Loss:     Estimated blood loss was minimal. Procedure:                Pre-Anesthesia Assessment:                           - Prior to the procedure, a History and Physical                            was performed, and patient medications and                            allergies were reviewed. The patient's tolerance of                            previous anesthesia was also reviewed. The risks                            and benefits of the procedure and the sedation                            options and risks were discussed with the patient.                            All questions were answered, and informed consent                            was obtained. Prior Anticoagulants: The patient has                            taken no previous anticoagulant or antiplatelet  agents. ASA Grade Assessment: III - A patient with                            severe systemic disease. After reviewing the risks                            and benefits, the patient was deemed in                            satisfactory condition to undergo the procedure.                            After obtaining informed consent, the scope was                            passed under direct vision. Throughout the                            procedure, the patient's blood pressure, pulse, and                            oxygen saturations were monitored continuously. The                            TJF-Q180V ([id number]) Olympus Duodenoscope was                            introduced through the mouth, and used to inject                            contrast into and used to inject contrast into the                            bile duct and ventral pancreatic duct. The ERCP was                            technically difficult and complex due to                            challenging cannulation and difficulty passing                            guidewires through biliary ductal stenosis.                            Successful completion of the procedure was aided by                            performing the maneuvers documented (below) in this                            report. The patient tolerated the procedure. Scope In: Scope Out: Findings:      A scout film of the abdomen was obtained.  Surgical clips, consistent       with a previous cholecystectomy, were seen in the area of the right       upper quadrant of the abdomen.      The esophagus was successfully intubated under direct vision without       detailed examination of the pharynx, larynx, and associated structures.       A large amount of food (residue) was found in the entire examined       stomach. The major papilla was congested, edematous, and the orifice       itself small. The location of the ampulla was at an angulation.      The bile duct could not be cannulated with the short-nosed traction       sphincterotome on multiple attempts. Repeated attempts at biliary       cannulation were not successful while using a wire-guided approach.       Eventually after changing to a 0.025 Revolution jagwire, in a semi-short        position, I was able to place the wire within the pancreatic duct.       Decision was made to pursue a double-wire approach. The wire was left       within the pancreatic duct. The bile duct could not be cannulated with       the short-nosed traction sphincterotome even using the 0.035 Jagwire,       0.025 Revolution Jagwire, and the 0.25 Revolution Angled wire. I       proceeded with placement of one 4 Fr by 7 cm temporary plastic       pancreatic stent with a full external pigtail was placed into the       ventral pancreatic duct. The stent was in good position.      Further attempts at biliary cannulation with using the pancreatic stent       in order to straighten the biliary duct were not succesful and the bile       duct could not be cannulated with the short-nosed traction       sphincterotome.      I proceeded with an 8 mm biliary precut sphincterotomy was made with a       monofilament needle knife over a pancreatic stent using ERBE       electrocautery as a Goff technique in order to try and gain access into       the biliary tree. There was self limited oozing from the sphincterotomy       which did not require treatment.      Eventually transitioning to the angled short 0.025 inch Antonietta Breach was       passed into the biliary tree with manipulation. The short-nosed traction       sphincterotome was passed over the guidewire and the bile duct was then       deeply cannulated. Contrast was injected. I personally interpreted the       bile duct images. Ductal flow of contrast was adequate. Image quality       was adequate. Contrast initially only into the bile duct but not       proximally into the hepatic ducts. Opacification of the entire biliary       tree except for the cystic duct and gallbladder was successful. The       middle third of the main bile duct was moderately dilated. The largest  diameter was 11 mm. I extended the biliary sphincterotomy was to a total       of  10 mm in length with a monofilament Jagtome sphincterotome using ERBE       electrocautery. There was no post-sphincterotomy bleeding. I attempted       to get different wires more distally, but angulation was noted in the       CHD region and I could not get deep wire access into the right or left       systems.      I decided to attempt selective cannulation. The bile duct was explored       endoscopically using the SpyGlass direct visualization system. The       SpyScope was advanced to the bifurcation over the wire into the CHD.       Visibility with the scope was good. The upper third of the main bile       duct contained multiple severe stenoses at the CHD that did not allow       the Spyglass scope to go any further. I could visualize the cystic duct.       I used a long 0.025 inch Revolution angled Jagwire and after significant       efforts was able to pass this into the left hepatic system. I proceeded       with a long-exchange. The bile duct was deeply cannulated. I could only       get the sphincterotome to go to the CHD region due to the angulation at       the bifurcation. Contrast was injected. Opacification of the left main       hepatic duct and right main hepatic duct was successful. The maximum       diameter of the ducts was between 5-10 mm with the left system being       much more dilated as per the MRICP. The left main hepatic duct and right       main hepatic duct contained multiple severe stenoses as did the       bifurcation. I could not pass a sweeping balloon through the region. I       could not pass a sphincterotome through the region. I wanted to proceed       with dilation of the CHD and LHD and RHD. I passed a Hurricane 4 mm       balloon dilator but even with gentle back-traction the balloon would not       go through the CHD angulation. Thus dilation was unsuccessful. I did       then proceed with attempt at wire cannulation of the RHD (though not the        largest system from a stenting perspective, but the angulation there       also did not allow the dilation balloon or the sphincterotome to pass.       Unfortunately, it we felt that further attempts at placement of a stent       (UCSEMS or Plastic) would not be possible since I could not even allow       passage of the small dilation balloon or sphincterotome.      A pancreatogram was not performed.      The duodenoscope was withdrawn from the patient. Impression:               - A large amount of food (residue) in  the stomach.                           - The major papilla appeared congested, edematous,                            and small. It was at an acute angulation.                           - Double wire technique and then pancreatic stent                            placement and precut sphincterotomy Goff technique                            required to enter the biliary tree.                           - Multiple severe biliary strictures were found in                            the CHD and the LHD and RHD.Marland Kitchen The strictures were                            malignant appearing - consistent with know mass in                            that region.                           - Spyglass required for selective wire cannulation                            of the left hepatic duct system.                           - One temporary plastic pancreatic stent was placed                            into the ventral pancreatic duct to decrease PEP                            (but also used as noted above to aid in                            cannulation).                           - Unfortunately, the sphincterotomes and dilation                            balloons would not pass/traverse the strictures as                            a result of  the significant angulation of the                            bifurcation. Endoscopic therapies are not possibe                            with her  anatomy. Moderate Sedation:      Not Applicable - Patient had care per Anesthesia. Recommendation:           - The patient will be observed post-procedure,                            until all discharge criteria are met.                           - Admit the patient to hospital ward for ongoing                            care.                           - Advance diet as tolerated.                           - Check liver enzymes (AST, ALT, alkaline                            phosphatase, bilirubin) tomorrow.                           - Observe patient's clinical course.                           - Start 40 mg PPI QD to aid in healing of the                            sphincterotomy site.                           - Watch for pancreatitis, bleeding, perforation,                            and cholangitis.                           - Would likely continue Antibiotics until the                            patient's biliary system is drained (since I could                            not stent the regions of the proximal biliary tree                            she is at risk of cholangitis). I gave Meropenem  due to her allergies, but if medical service feels                            she can be on other antibiotics, I am OK with that                            as long as it covers GNRs and Anaerobes.                           - IR Consultation for discussion about PBD                            placement if further therapies are to be considered                            with the patient/family. She has ascites that may                            complicate potential procedure.                           - The findings and recommendations were discussed                            with the patient.                           - The findings and recommendations were discussed                            with the patient's family. Procedure Code(s):        ---  Professional ---                           [phone], Endoscopic retrograde                            cholangiopancreatography (ERCP); with placement of                            endoscopic stent into biliary or pancreatic duct,                            including pre- and post-dilation and guide wire                            passage, when performed, including sphincterotomy,                            when performed, each stent                           [id number], Endoscopic cannulation of papilla with  direct visualization of pancreatic/common bile                            duct(s) (List separately in addition to code(s) for                            primary procedure) Diagnosis Code(s):        --- Professional ---                           K83.8, Other specified diseases of biliary tract                           K83.1, Obstruction of bile duct CPT copyright 2019 [employer]. All rights reserved. The codes documented in this report are preliminary and upon coder review may  be revised to meet current compliance requirements. Justice Britain, MD 12/23/2019 2:29:10 PM Number of Addenda: 0

## 2019-12-24 ENCOUNTER — Encounter (HOSPITAL_COMMUNITY): Payer: Self-pay | Admitting: Gastroenterology

## 2019-12-24 ENCOUNTER — Inpatient Hospital Stay (HOSPITAL_COMMUNITY): Payer: Medicare HMO

## 2019-12-24 HISTORY — PX: IR PERCUTANEOUS TRANSHEPATIC CHOLANGIOGRAM: IMG6042

## 2019-12-24 LAB — CBC WITH DIFFERENTIAL/PLATELET
Abs Immature Granulocytes: 0.02 10*3/uL (ref 0.00–0.07)
Basophils Absolute: 0 10*3/uL (ref 0.0–0.1)
Basophils Relative: 0 %
Eosinophils Absolute: 0 10*3/uL (ref 0.0–0.5)
Eosinophils Relative: 0 %
HCT: 28.3 % — ABNORMAL LOW (ref 36.0–46.0)
Hemoglobin: 9.7 g/dL — ABNORMAL LOW (ref 12.0–15.0)
Immature Granulocytes: 0 %
Lymphocytes Relative: 6 %
Lymphs Abs: 0.3 10*3/uL — ABNORMAL LOW (ref 0.7–4.0)
MCH: 33.7 pg (ref 26.0–34.0)
MCHC: 34.3 g/dL (ref 30.0–36.0)
MCV: 98.3 fL (ref 80.0–100.0)
Monocytes Absolute: 0.1 10*3/uL (ref 0.1–1.0)
Monocytes Relative: 1 %
Neutro Abs: 5 10*3/uL (ref 1.7–7.7)
Neutrophils Relative %: 93 %
Platelets: 141 10*3/uL — ABNORMAL LOW (ref 150–400)
RBC: 2.88 MIL/uL — ABNORMAL LOW (ref 3.87–5.11)
RDW: 18.4 % — ABNORMAL HIGH (ref 11.5–15.5)
WBC: 5.4 10*3/uL (ref 4.0–10.5)
nRBC: 0 % (ref 0.0–0.2)

## 2019-12-24 LAB — COMPREHENSIVE METABOLIC PANEL
ALT: 45 U/L — ABNORMAL HIGH (ref 0–44)
AST: 52 U/L — ABNORMAL HIGH (ref 15–41)
Albumin: 2.2 g/dL — ABNORMAL LOW (ref 3.5–5.0)
Alkaline Phosphatase: 216 U/L — ABNORMAL HIGH (ref 38–126)
Anion gap: 10 (ref 5–15)
BUN: 25 mg/dL — ABNORMAL HIGH (ref 8–23)
CO2: 22 mmol/L (ref 22–32)
Calcium: 7.5 mg/dL — ABNORMAL LOW (ref 8.9–10.3)
Chloride: 102 mmol/L (ref 98–111)
Creatinine, Ser: 1.15 mg/dL — ABNORMAL HIGH (ref 0.44–1.00)
GFR calc Af Amer: 49 mL/min — ABNORMAL LOW (ref >60)
GFR calc non Af Amer: 42 mL/min — ABNORMAL LOW (ref >60)
Glucose, Bld: 162 mg/dL — ABNORMAL HIGH (ref 70–99)
Potassium: 4.9 mmol/L (ref 3.5–5.1)
Sodium: 134 mmol/L — ABNORMAL LOW (ref 135–145)
Total Bilirubin: 7.8 mg/dL — ABNORMAL HIGH (ref 0.3–1.2)
Total Protein: 6.6 g/dL (ref 6.5–8.1)

## 2019-12-24 MED ORDER — IODIXANOL 320 MG/ML IV SOLN
50.0000 mL | Freq: Once | INTRAVENOUS | Status: AC | PRN
  Administered 2019-12-24: 20 mL

## 2019-12-24 MED ORDER — FENTANYL CITRATE (PF) 100 MCG/2ML IJ SOLN
INTRAMUSCULAR | Status: AC
  Filled 2019-12-24: qty 4

## 2019-12-24 MED ORDER — FENTANYL CITRATE (PF) 100 MCG/2ML IJ SOLN
INTRAMUSCULAR | Status: AC | PRN
  Administered 2019-12-24 (×2): 50 ug via INTRAVENOUS

## 2019-12-24 MED ORDER — MIDAZOLAM HCL 2 MG/2ML IJ SOLN
INTRAMUSCULAR | Status: AC | PRN
  Administered 2019-12-24 (×2): 1 mg via INTRAVENOUS

## 2019-12-24 MED ORDER — AMLODIPINE BESYLATE 10 MG PO TABS
10.0000 mg | ORAL_TABLET | Freq: Every day | ORAL | Status: DC
  Administered 2019-12-24 – 2019-12-26 (×3): 10 mg via ORAL
  Filled 2019-12-24 (×3): qty 1

## 2019-12-24 MED ORDER — LIDOCAINE HCL 1 % IJ SOLN
INTRAMUSCULAR | Status: AC
  Filled 2019-12-24: qty 20

## 2019-12-24 MED ORDER — MIDAZOLAM HCL 2 MG/2ML IJ SOLN
INTRAMUSCULAR | Status: AC
  Filled 2019-12-24: qty 6

## 2019-12-24 NOTE — Progress Notes (Signed)
Palliative Care Progress Note  Palliative Care Consult Received.  Chart reviewed.  Attempted to see patient today, however, she was off the floor for procedure.  Will attempt visit for initial consultation again either afternoon or tomorrow.  Micheline Rough, MD [facility] Team [phone]  NO CHARGE NOTE

## 2019-12-24 NOTE — Procedures (Signed)
Interventional Radiology Procedure Note  Procedure: Internal/External biliary drainage catheter placement with cholangiogram  Complications: None  Estimated Blood Loss: < 10 mL   Findings: After right lobe biliary access, cholangiogram demonstrates multiple intrahepatic/confluence and extrahepatic biliary strictures. Able to cross strictures and place 10 Fr int/ext biliary drainage catheter which was formed in duodenum. Attached to gravity bag drainage.  Venetia Night. Kathlene Cote, M.D Pager:  [phone]

## 2019-12-24 NOTE — Progress Notes (Signed)
Initial Nutrition Assessment  DOCUMENTATION CODES:   Underweight (Suspected severe PCM)  INTERVENTION:  Will provide oral nutrition supplement with diet advancement    NUTRITION DIAGNOSIS:   Inadequate oral intake related to decreased appetite, cancer and cancer related treatments as evidenced by percent weight loss (waxing and waning appetite per chart)  GOAL:   Patient will meet greater than or equal to 90% of their needs   MONITOR:   Labs, I & O's, Diet advancement, Supplement acceptance, PO intake, Weight trends  REASON FOR ASSESSMENT:   Malnutrition Screening Tool    ASSESSMENT:  RD working remotely.  86 year old female with past medical history of GERD, HTN, Schatzki's ring s/p dilation, anemia due to antineoplastic chemotherapy, and pancreatic cancer with metastates to liver presented to endoscopy suite for bilary stent placement.  5/13 - last chemotherapy  Patient is s/p internal/external biliary drainage catheter placement with cholangiogram this afternoon.   Unable to reach pt via phone today. Per oncology notes on 7/29 pt reports waxing and waning appetite, not drinking much Ensure clear and tolerating a little milk. Per chart, weights have trended down ~17 lb (17%) in the last 2 months; significant. Patient is underweight and given advanced age and metastatic pancreatic cancer, highly suspect malnutrition however unable to identify at this time. Patient is currently NPO, will monitor for diet advancement and provide nutrient dense supplement as appropriate.   Medications reviewed and include: Methylprednisolone, Mirabegron IVF: D5 NaCl with KCl 20 mEq @ 75 ml/hr IVPB: Merrem Labs: Na 134 (L), BUN 25 (H), Cr 1.15 (H), Hgb 9.7 (L) trending down   NUTRITION - FOCUSED PHYSICAL EXAM: Unable to complete at this time, RD working remotely.    Diet Order:   Diet Order            Diet NPO time specified Except for: Sips with Meds  Diet effective midnight                  EDUCATION NEEDS:   Not appropriate for education at this time  Skin:  Skin Assessment: Reviewed RN Assessment  Last BM:  pta  Height:   Ht Readings from Last 1 Encounters:  12/23/19 5\' 3"  (1.6 m)    Weight:   Wt Readings from Last 1 Encounters:  12/23/19 37.2 kg    Ideal Body Weight:  52.3 kg  BMI:  Body mass index is 14.53 kg/m.  Estimated Nutritional Needs:   Kcal:  1300-1500  Protein:  65-75  Fluid:  >/= 1.2 L/day   Lajuan Lines, RD, LDN Clinical Nutrition After Hours/Weekend Pager # in [place]

## 2019-12-24 NOTE — Progress Notes (Signed)
Patient transported to IR for procedure; transported via stretcher; IR staff assisted with transfer; patient currently off floor;

## 2019-12-24 NOTE — Progress Notes (Signed)
PROGRESS NOTE    [name]  QIW:[id number] DOB: Dec 06, 1928 DOA: 12/23/2019 PCP: Fayrene Helper, MD   Brief Narrative: 86 y.o. female past medical history of metastatic pancreatic cancer with metastases to the liver, anemia due to antineoplastic chemotherapy on Aranesp a DO NOT RESUSCITATE with the last chemotherapy on 09/22/2018 who was admitted to [facility] endoscopy suite for an ERCP for attempt biliary decompression in the setting of metastatic pancreatic cancer.  Subjective:  No acute events overnight.  This morning patient was on the way to percutaneous drainage placement Remains afebrile. Assessment & Plan:  Pancreatic cancer metastasized to liver with malignant biliary obstruction GI attempted ERCP with stenting and unsuccessful, noted plan for PTC/biliary drainage by IR. On Merrem.  Afebrile and hemodynamically stable,wbc stable 5.4. LFTs trending down as below. Recent Labs  Lab 12/20/19 0900 12/22/19 0823 12/24/19 0322  AST 81* 97* 52*  ALT 53* 58* 45*  ALKPHOS 250* 257* 216*  BILITOT 11.5* 12.4* 7.8*  PROT 7.7 7.6 6.6  ALBUMIN 3.0* 2.6* 2.2*  INR 1.4*  --   --    Anemia due to antineoplastic chemotherapy: Hemoglobin is stable 9.7 g.  Monitor.  Hypertension on amlodipine.  BP 160s-180s. Resume amlodipine.  Goals of care: patient is DNR.  DVT prophylaxis: enoxaparin (LOVENOX) injection 30 mg Start: 12/25/19 1000 Code Status:   Code Status: DNR Family Communication: plan of care discussed with patient at bedside.  Status is: Inpatient Remains inpatient appropriate because:IV treatments appropriate due to intensity of illness or inability to take PO and Inpatient level of care appropriate due to severity of illness  Dispo: The patient is from: Home              Anticipated d/c is to: Home              Anticipated d/c date is: 1 day              Patient currently is not medically stable to d/c.   Diet Order            Diet NPO time specified Except  for: Sips with Meds  Diet effective midnight                 Body mass index is 14.53 kg/m.  Consultants:see note  Procedures:see note Microbiology:see note Blood Culture    Component Value Date/Time   SDES  12/08/2019 1138    URINE, CLEAN CATCH Performed at [facility], [address], [place], [place] [address]    [facility]  12/08/2019 1138    NONE Performed at [facility], [address], [place], [place] [address]    CULT MULTIPLE SPECIES PRESENT, SUGGEST RECOLLECTION (A) 12/08/2019 1138   REPTSTATUS 12/09/2019 FINAL 12/08/2019 1138    Other culture-see note  Medications: Scheduled Meds: . Chlorhexidine Gluconate Cloth  6 each Topical Daily  . clonazePAM  0.5 mg Oral Daily  . cycloSPORINE  1 drop Both Eyes BID  . diphenhydrAMINE  50 mg Oral Once  . [START ON 12/25/2019] enoxaparin (LOVENOX) injection  30 mg Subcutaneous Q24H  . famotidine  20 mg Oral Once  . methylPREDNISolone  32 mg Oral Daily  . metoprolol tartrate  25 mg Oral Daily  . mirabegron ER  25 mg Oral Daily  . traMADol  50 mg Oral Q12H   Continuous Infusions: . sodium chloride    . dextrose 5 % and 0.45 % NaCl with KCl 20 mEq/L 75 mL/hr at 12/23/19 1827  .  lactated ringers 20 mL/hr at 12/23/19 0835  . meropenem (MERREM) IV 500 mg (12/23/19 2052)    Antimicrobials: Anti-infectives (From admission, onward)   Start     Dose/Rate Route Frequency Ordered Stop   12/23/19 2200  meropenem (MERREM) 500 mg in sodium chloride 0.9 % 100 mL IVPB     Discontinue     500 mg 200 mL/hr over 30 Minutes Intravenous Every 12 hours 12/23/19 1708     12/23/19 0800  meropenem (MERREM) 1 g in sodium chloride 0.9 % 100 mL IVPB        1 g 200 mL/hr over 30 Minutes Intravenous  Once 12/23/19 0748 12/23/19 1637       Objective: Vitals: Today's Vitals   12/23/19 2055 12/24/19 0027 12/24/19 0437 12/24/19 0744  BP:  (!) 142/64 (!) 141/77   Pulse:  (!) 59 66   Resp:  16 16   Temp:  98 F (36.7 C) 98 F (36.7 C)    TempSrc:      SpO2:  96% 100%   Weight:      Height:      PainSc: 0-No pain   0-No pain    Intake/Output Summary (Last 24 hours) at 12/24/2019 0815 Last data filed at 12/24/2019 0446 Gross per 24 hour  Intake 2100 ml  Output 650 ml  Net 1450 ml   Filed Weights   12/19/19 1402 12/23/19 0745  Weight: 41.7 kg 37.2 kg   Weight change:    Intake/Output from previous day: 08/13 0701 - 08/14 0700 In: 2100 [I.V.:2000; IV Piggyback:100] Out: 650 [Urine:650] Intake/Output this shift: No intake/output data recorded.  Examination:  General exam: AAOx3, pleasant,NAD, weak appearing. HEENT:Oral mucosa moist, Ear/Nose WNL grossly,dentition normal. Respiratory system: bilaterally clear,no wheezing or crackles,no use of accessory muscle, non tender. Cardiovascular system: S1 & S2 +, regular, No JVD. Gastrointestinal system: Abdomen soft, NT,ND, BS+. Nervous System:Alert, awake, moving extremities and grossly nonfocal Extremities: No edema, distal peripheral pulses palpable.  Skin: No rashes,no icterus. MSK: Normal muscle bulk,tone, power  Data Reviewed: I have personally reviewed following labs and imaging studies CBC: Recent Labs  Lab 12/20/19 0900 12/22/19 0823 12/23/19 1705 12/24/19 0322  WBC 8.5 9.6 6.0 5.4  NEUTROABS 6.2 8.1*  --  5.0  HGB 10.9* 10.3* 10.5* 9.7*  HCT 31.4* 30.2* 32.0* 28.3*  MCV 98.8 97.7 101.3* 98.3  PLT 153.0 157 142* 734*   Basic Metabolic Panel: Recent Labs  Lab 12/20/19 0900 12/22/19 0823 12/23/19 1705 12/24/19 0322  NA 136 136  --  134*  K 4.3 4.6  --  4.9  CL 99 100  --  102  CO2 25 23  --  22  GLUCOSE 128* 108*  --  162*  BUN 27* 32*  --  25*  CREATININE 1.59* 1.41* 1.19* 1.15*  CALCIUM 8.8 8.5*  --  7.5*  MG  --  2.0  --   --    GFR: Estimated Creatinine Clearance: 19.5 mL/min (A) (by C-G formula based on SCr of 1.15 mg/dL (H)). Liver Function Tests: Recent Labs  Lab 12/20/19 0900 12/22/19 0823 12/24/19 0322  AST 81* 97* 52*   ALT 53* 58* 45*  ALKPHOS 250* 257* 216*  BILITOT 11.5* 12.4* 7.8*  PROT 7.7 7.6 6.6  ALBUMIN 3.0* 2.6* 2.2*   No results for input(s): LIPASE, AMYLASE in the last 168 hours. No results for input(s): AMMONIA in the last 168 hours. Coagulation Profile: Recent Labs  Lab 12/20/19 0900  INR  1.4*   Cardiac Enzymes: No results for input(s): CKTOTAL, CKMB, CKMBINDEX, TROPONINI in the last 168 hours. BNP (last 3 results) No results for input(s): PROBNP in the last 8760 hours. HbA1C: No results for input(s): HGBA1C in the last 72 hours. CBG: No results for input(s): GLUCAP in the last 168 hours. Lipid Profile: No results for input(s): CHOL, HDL, LDLCALC, TRIG, CHOLHDL, LDLDIRECT in the last 72 hours. Thyroid Function Tests: No results for input(s): TSH, T4TOTAL, FREET4, T3FREE, THYROIDAB in the last 72 hours. Anemia Panel: No results for input(s): VITAMINB12, FOLATE, FERRITIN, TIBC, IRON, RETICCTPCT in the last 72 hours. Sepsis Labs: No results for input(s): PROCALCITON, LATICACIDVEN in the last 168 hours.  Recent Results (from the past 240 hour(s))  SARS CORONAVIRUS 2 (TAT 6-24 HRS) Nasopharyngeal Nasopharyngeal Swab     Status: None   Collection Time: 12/20/19  9:43 AM   Specimen: Nasopharyngeal Swab  Result Value Ref Range Status   SARS Coronavirus 2 NEGATIVE NEGATIVE Final    Comment: (NOTE) SARS-CoV-2 target nucleic acids are NOT DETECTED.  The SARS-CoV-2 RNA is generally detectable in upper and lower respiratory specimens during the acute phase of infection. Negative results do not preclude SARS-CoV-2 infection, do not rule out co-infections with other pathogens, and should not be used as the sole basis for treatment or other patient management decisions. Negative results must be combined with clinical observations, patient history, and epidemiological information. The expected result is Negative.  Fact Sheet for  Patients: SugarRoll.be  Fact Sheet for Healthcare Providers: https://www.woods-mathews.com/  This test is not yet approved or cleared by the [place] FDA and  has been authorized for detection and/or diagnosis of SARS-CoV-2 by FDA under an Emergency Use Authorization (EUA). This EUA will remain  in effect (meaning this test can be used) for the duration of the COVID-19 declaration under Se ction 564(b)(1) of the Act, 21 U.S.C. section 360bbb-3(b)(1), unless the authorization is terminated or revoked sooner.  Performed at [facility], [place] [address] [place], [place] [address]       Radiology Studies: No results found.   LOS: 1 day   Antonieta Pert, MD [facility]  12/24/2019, 8:15 AM

## 2019-12-25 DIAGNOSIS — Z9889 Other specified postprocedural states: Secondary | ICD-10-CM

## 2019-12-25 LAB — COMPREHENSIVE METABOLIC PANEL
ALT: 45 U/L — ABNORMAL HIGH (ref 0–44)
AST: 43 U/L — ABNORMAL HIGH (ref 15–41)
Albumin: 2.4 g/dL — ABNORMAL LOW (ref 3.5–5.0)
Alkaline Phosphatase: 205 U/L — ABNORMAL HIGH (ref 38–126)
Anion gap: 9 (ref 5–15)
BUN: 24 mg/dL — ABNORMAL HIGH (ref 8–23)
CO2: 23 mmol/L (ref 22–32)
Calcium: 8 mg/dL — ABNORMAL LOW (ref 8.9–10.3)
Chloride: 97 mmol/L — ABNORMAL LOW (ref 98–111)
Creatinine, Ser: 1.02 mg/dL — ABNORMAL HIGH (ref 0.44–1.00)
GFR calc Af Amer: 56 mL/min — ABNORMAL LOW (ref >60)
GFR calc non Af Amer: 49 mL/min — ABNORMAL LOW (ref >60)
Glucose, Bld: 153 mg/dL — ABNORMAL HIGH (ref 70–99)
Potassium: 5.2 mmol/L — ABNORMAL HIGH (ref 3.5–5.1)
Sodium: 129 mmol/L — ABNORMAL LOW (ref 135–145)
Total Bilirubin: 6.7 mg/dL — ABNORMAL HIGH (ref 0.3–1.2)
Total Protein: 6.8 g/dL (ref 6.5–8.1)

## 2019-12-25 LAB — CBC
HCT: 30.4 % — ABNORMAL LOW (ref 36.0–46.0)
Hemoglobin: 10.3 g/dL — ABNORMAL LOW (ref 12.0–15.0)
MCH: 33.6 pg (ref 26.0–34.0)
MCHC: 33.9 g/dL (ref 30.0–36.0)
MCV: 99 fL (ref 80.0–100.0)
Platelets: 166 10*3/uL (ref 150–400)
RBC: 3.07 MIL/uL — ABNORMAL LOW (ref 3.87–5.11)
RDW: 17.7 % — ABNORMAL HIGH (ref 11.5–15.5)
WBC: 9.7 10*3/uL (ref 4.0–10.5)
nRBC: 0 % (ref 0.0–0.2)

## 2019-12-25 MED ORDER — SODIUM CHLORIDE 0.9% FLUSH
5.0000 mL | Freq: Three times a day (TID) | INTRAVENOUS | Status: DC
  Administered 2019-12-25 – 2019-12-26 (×4): 5 mL

## 2019-12-25 NOTE — Progress Notes (Signed)
Referring Physician(s): Tye Savoy (GI)  Supervising Physician: Aletta Edouard  Patient Status:  [facility] - In-pt  Chief Complaint: None  Subjective:  Biliary obstruction secondary to pancreatic cancer s/p internal/external biliary drain placement in IR 12/24/2019 by Dr. Kathlene Cote. Patient awake and alert laying in bed watching TV with no complaints at this time. Son at bedside. Biliary drain site c/d/i.   Allergies: Iohexol, Aciphex [rabeprazole sodium], Amlodipine besylate-valsartan, Esomeprazole magnesium, Omeprazole, Ciprofloxacin, Penicillins, and Tessalon perles [benzonatate]  Medications: Prior to Admission medications   Medication Sig Start Date End Date Taking? Authorizing Provider  amLODipine (NORVASC) 10 MG tablet TAKE 1 TABLET DAILY Patient taking differently: Take 10 mg by mouth daily.  02/02/19  Yes Fayrene Helper, MD  clonazePAM (KLONOPIN) 0.5 MG tablet Take 1 tablet (0.5 mg total) by mouth at bedtime. Patient taking differently: Take 0.5 mg by mouth daily.  12/12/19  Yes Fayrene Helper, MD  cycloSPORINE (RESTASIS) 0.05 % ophthalmic emulsion Place 1 drop into both eyes 2 (two) times daily.   Yes [provider]  ergocalciferol (VITAMIN D2) 1.25 MG (50000 UT) capsule Take 1 capsule (50,000 Units total) by mouth once a week. Patient taking differently: Take 50,000 Units by mouth every Wednesday.  10/13/19  Yes Derek Jack, MD  fluticasone (CUTIVATE) 0.05 % cream Apply 1 application topically daily as needed (rash).  07/17/19  Yes [provider]  HYDROcodone-acetaminophen (NORCO) 5-325 MG tablet Take 1 tablet by mouth every 8 (eight) hours as needed for moderate pain. Patient taking differently: Take 0.5 tablets by mouth every 8 (eight) hours as needed for moderate pain.  12/14/19  Yes Lockamy, Randi L, NP-C  lidocaine (LIDODERM) 5 % Place 1 patch onto the skin daily. Remove & Discard patch within 12 hours or as directed by MD 12/22/19  Yes  Derek Jack, MD  loperamide (IMODIUM A-D) 2 MG tablet Take 2 mg by mouth daily.    Yes [provider]  metoprolol tartrate (LOPRESSOR) 25 MG tablet TAKE 1 TABLET DAILY FOR BLOOD PRESSURE (DOSE REDUCTION) Patient taking differently: Take 25 mg by mouth daily.  04/11/19  Yes Fayrene Helper, MD  MYRBETRIQ 50 MG TB24 tablet Take 50 mg by mouth daily. 09/26/19  Yes [provider]  pantoprazole (PROTONIX) 20 MG tablet TAKE 1 TABLET DAILY Patient taking differently: Take 20 mg by mouth daily.  08/10/19  Yes Fayrene Helper, MD  potassium chloride (KLOR-CON) 10 MEQ tablet Take 2 tablets (20 mEq total) by mouth daily. 12/08/19  Yes Derek Jack, MD  CISPLATIN IV Inject into the vein. Days 1, 8 every 21 days 02/10/19   [provider]  GEMCITABINE HCL IV Inject into the vein. Days 1, 8 every 21 days 02/10/19   [provider]  lidocaine-prilocaine (EMLA) cream Apply 1 application topically as needed. Apply to portacath site as needed Patient taking differently: Apply 1 application topically daily as needed (port access).  03/11/19   Derek Jack, MD  oxybutynin (DITROPAN-XL) 10 MG 24 hr tablet TAKE 1 TABLET(5 MG) BY MOUTH AT BEDTIME 10/13/19   Derek Jack, MD  traMADol (ULTRAM) 50 MG tablet Take 1 tablet (50 mg total) by mouth 3 (three) times daily. 10/27/19   Lockamy, Randi L, NP-C  prochlorperazine (COMPAZINE) 10 MG tablet Take 1 tablet (10 mg total) by mouth every 6 (six) hours as needed (Nausea or vomiting). 09/18/16 10/21/17  Holley Bouche, NP     Vital Signs: BP (!) 163/77   Pulse Marland Kitchen)  59   Temp 97.9 F (36.6 C) (Oral)   Resp 16   Ht 5\' 3"  (1.6 m)   Wt 82 lb (37.2 kg)   SpO2 100%   BMI 14.53 kg/m   Physical Exam Vitals and nursing note reviewed.  Constitutional:      General: She is not in acute distress.    Appearance: Normal appearance.  Pulmonary:     Effort: Pulmonary effort is normal. No respiratory  distress.  Abdominal:     Comments: Biliary drain site without tenderness, erythema, drainage, or active bleeding; approximately 100 cc output of dark bile in gravity bag.  Skin:    General: Skin is warm and dry.  Neurological:     Mental Status: She is alert.     Imaging: DG ERCP BILIARY & PANCREATIC DUCTS  Result Date: 12/24/2019 CLINICAL DATA:  86 year old female with a history pancreatic cancer EXAM: ERCP TECHNIQUE: Multiple spot images obtained with the fluoroscopic device and submitted for interpretation post-procedure. FLUOROSCOPY TIME:  Fluoroscopy Time:  18 minutes 16 seconds COMPARISON:  MRI 12/16/2019 FINDINGS: Limited intraoperative fluoroscopic spot images during ERCP. Initial image demonstrates endoscope projecting over the upper abdomen. Surgical changes of cholecystectomy, as well as additional surgical clips within the mid abdomen. Subsequently there is cannulation of the ampulla and retrograde of infusion of contrast partially opacifying the intrahepatic and extrahepatic biliary ducts. Balloon angioplasty balloon appears to have been deployed. IMPRESSION: Limited images during ERCP, as above. Please refer to the dictated operative report for full details of intraoperative findings and procedure. Electronically Signed   By: Corrie Mckusick D.O.   On: 12/24/2019 09:12   IR PERCUTANEOUS TRANSHEPATIC CHOLANGIOGRAM  Result Date: 12/24/2019 INDICATION: Metastatic pancreatic carcinoma and biliary obstruction with inability to advance a biliary stent at the time of ERCP yesterday. EXAM: PLACEMENT OF INTERNAL/EXTERNAL PERCUTANEOUS BILIARY DRAINAGE CATHETER WITH CHOLANGIOGRAM MEDICATIONS: The patient received a scheduled dose of 500 mg IV meropenem during the procedure. ANESTHESIA/SEDATION: Moderate (conscious) sedation was employed during this procedure. A total of Versed 2.0 mg and Fentanyl 100 mcg was administered intravenously. Moderate Sedation Time: 32 minutes. The patient's level of  consciousness and vital signs were monitored continuously by radiology nursing throughout the procedure under my direct supervision. FLUOROSCOPY TIME:  Fluoroscopy Time: 6 minutes and 6 seconds. 63.0 mGy. COMPLICATIONS: None immediate. PROCEDURE: Informed written consent was obtained from the patient after a thorough discussion of the procedural risks, benefits and alternatives. All questions were addressed. Maximal Sterile Barrier Technique was utilized including caps, mask, sterile gowns, sterile gloves, sterile drape, hand hygiene and skin antiseptic. A timeout was performed prior to the initiation of the procedure. Ultrasound was used to localize the liver. The abdominal wall was prepped with chlorhexidine. Local anesthesia was provided with 1% lidocaine. Under direct ultrasound guidance, access of a right lobe bile duct was performed by puncture with a 21 gauge needle. Ultrasound image documentation was performed. After bile duct puncture, a cholangiogram was performed with injection of contrast. Fluoroscopic imaging was saved. A guidewire was advanced through the needle. A transitional dilator was placed. A 5 French catheter was advanced into the common bile duct over a guidewire. A hydrophilic guidewire was then advanced through the ampulla into the duodenum. The percutaneous tract was dilated and a 10 French internal/external biliary drainage catheter advanced over the wire. The distal portion of the catheter was formed in the duodenal lumen and the catheter position appropriately. The drain was injected with contrast material to confirm position. The catheter  was secured at the skin with a Prolene retention suture and StatLock device. The drain was connected to a gravity drainage bag. FINDINGS: There was some ascites present adjacent to the inferior tip of the right lobe as well as the lateral segment of the left lobe. By ultrasound, the most accessible bile duct was a right lobe duct roughly in the mid  right lobe where there was no overlying ascites. Cholangiogram shows high-grade irregular strictures of central right lobe ducts extending to the intrahepatic biliary confluence with the left lobe. Irregular narrowing also is noted in several segments of the common hepatic and common bile ducts. The gallbladder has been removed. A pancreatic ductal stent is present. The distal common bile duct is very dilated and initially there was little to no flow of contrast through the ampulla. A hydrophilic guidewire did passed through segments of biliary stricture and access was able to be gained across the ampulla and to the level of the duodenum. This allowed placement of an internal/external biliary drainage catheter which was formed in the duodenal lumen with sideholes extending up into the right lobe of the liver and across the biliary confluence. IMPRESSION: Percutaneous cholangiogram demonstrates irregular high-grade biliary strictures involving multiple central intrahepatic right lobe ducts, the biliary confluence, common hepatic duct and common bile duct. After right lobe access, strictures were able to be crossed allowing placement of a 10 French internal/external biliary drainage catheter that was formed in the duodenum. Sideholes extend up into right lobe ducts and the drainage catheter will be left to gravity bag drainage. Electronically Signed   By: Aletta Edouard M.D.   On: 12/24/2019 14:02    Labs:  CBC: Recent Labs    12/22/19 0823 12/23/19 1705 12/24/19 0322 12/25/19 0341  WBC 9.6 6.0 5.4 9.7  HGB 10.3* 10.5* 9.7* 10.3*  HCT 30.2* 32.0* 28.3* 30.4*  PLT 157 142* 141* 166    COAGS: Recent Labs    12/20/19 0900  INR 1.4*    BMP: Recent Labs    12/08/19 0817 12/20/19 0900 12/22/19 0823 12/23/19 1705 12/24/19 0322 12/25/19 0341  NA  --  136 136  --  134* 129*  K  --  4.3 4.6  --  4.9 5.2*  CL  --  99 100  --  102 97*  CO2  --  25 23  --  22 23  GLUCOSE  --  128* 108*  --   162* 153*  BUN  --  27* 32*  --  25* 24*  CALCIUM  --  8.8 8.5*  --  7.5* 8.0*  CREATININE   < > 1.59* 1.41* 1.19* 1.15* 1.02*  GFRNONAA   < >  --  33* 40* 42* 49*  GFRAA   < >  --  38* 47* 49* 56*   < > = values in this interval not displayed.    LIVER FUNCTION TESTS: Recent Labs    12/20/19 0900 12/22/19 0823 12/24/19 0322 12/25/19 0341  BILITOT 11.5* 12.4* 7.8* 6.7*  AST 81* 97* 52* 43*  ALT 53* 58* 45* 45*  ALKPHOS 250* 257* 216* 205*  PROT 7.7 7.6 6.6 6.8  ALBUMIN 3.0* 2.6* 2.2* 2.4*    Assessment and Plan:  Biliary obstruction secondary to pancreatic cancer s/p internal/external biliary drain placement in IR 12/24/2019 by Dr. Kathlene Cote. Biliary drain stable with approximately 100 cc output of dark bile in gravity bag (additional 125 cc output from drain in past 24 hours per chart). Tbili 6.7  today, down from 7.8 yesterday. Continue current drain management- continue with Qshift flushes/monitor of output. Further plans per TRH/GI- appreciate and agree with management. IR to follow.   Electronically Signed: Earley Abide, PA-C 12/25/2019, 1:29 PM   I spent a total of 25 Minutes at the the patient's bedside AND on the patient's hospital floor or unit, greater than 50% of which was counseling/coordinating care for biliary obstruction s/p biliary drain placement.

## 2019-12-25 NOTE — Progress Notes (Signed)
Gastroenterology Inpatient Follow-up Note   PATIENT IDENTIFICATION  [name] is a 86 y.o. female with a pmh significant for metastatic pancreatic cancer with biliary obstruction, GERD, colon polyps, nephrolithiasis, hiatal hernia, Schatzki ring.  The patient was admitted to the hospital post ERCP attempt with unsuccessful biliary decompression and now status post percutaneous biliary drainage. Hospital Day: 3  SUBJECTIVE  The patient is evaluated with her son this morning. The patient is feeling well.  She still has some back pain her prior fall. She also has abdominal discomfort in the region of her percutaneous biliary drain. She has an appetite and wants to continue to eat. Her urine remains dark as do her eyes. Patient has no evidence of melanic stools per her report. The patient denies fevers or chills. Patient and son wondering when she will be able to be discharged.   OBJECTIVE  Scheduled Inpatient Medications:  . amLODipine  10 mg Oral Daily  . Chlorhexidine Gluconate Cloth  6 each Topical Daily  . clonazePAM  0.5 mg Oral Daily  . cycloSPORINE  1 drop Both Eyes BID  . enoxaparin (LOVENOX) injection  30 mg Subcutaneous Q24H  . methylPREDNISolone  32 mg Oral Daily  . metoprolol tartrate  25 mg Oral Daily  . mirabegron ER  25 mg Oral Daily  . sodium chloride flush  5 mL Intracatheter Q8H  . traMADol  50 mg Oral Q12H   Continuous Inpatient Infusions:  . sodium chloride    . lactated ringers 20 mL/hr at 12/23/19 0835  . meropenem (MERREM) IV 500 mg (12/25/19 1157)   PRN Inpatient Medications: morphine injection, ondansetron **OR** ondansetron (ZOFRAN) IV, polyethylene glycol, sodium chloride flush   Physical Examination  Temp:  [97.7 F (36.5 C)-97.9 F (36.6 C)] 97.7 F (36.5 C) (08/15 1410) Pulse Rate:  [55-59] 55 (08/15 1410) Resp:  [16-18] 16 (08/15 1410) BP: (145-163)/(76-81) 145/81 (08/15 1410) SpO2:  [99 %-100 %] 99 % (08/15 1410) Temp (24hrs),  Avg:97.8 F (36.6 C), Min:97.7 F (36.5 C), Max:97.9 F (36.6 C)  Weight: 37.2 kg GEN: NAD, appears stated age, doesn't appear chronically ill, with son at bedside PSYCH: Cooperative, without pressured speech EYE: Conjunctivae pink, sclerae anicteric ENT: MMM CV: Nontachycardic RESP: No audible wheezing GI: NABS, soft, tenderness to palpation in the region of percutaneous drain site, no rebound or guarding MSK/EXT: Trace bilateral pedal edema is present SKIN: No jaundice NEURO:  Alert & Oriented x 3, no focal deficits   Review of Data   Laboratory Studies   Recent Labs  Lab 12/22/19 0823 12/23/19 1705 12/25/19 0341  NA 136   < > 129*  K 4.6   < > 5.2*  CL 100   < > 97*  CO2 23   < > 23  BUN 32*   < > 24*  CREATININE 1.41*   < > 1.02*  GLUCOSE 108*   < > 153*  CALCIUM 8.5*   < > 8.0*  MG 2.0  --   --    < > = values in this interval not displayed.   Recent Labs  Lab 12/25/19 0341  AST 43*  ALT 45*  ALKPHOS 205*    Recent Labs  Lab 12/23/19 1705 12/23/19 1705 12/24/19 0322 12/24/19 0322 12/25/19 0341  WBC 6.0   < > 5.4   < > 9.7  HGB 10.5*   < > 9.7*   < > 10.3*  HCT 32.0*   < > 28.3*   < >  30.4*  PLT 142*  --  141*  --  166   < > = values in this interval not displayed.   Recent Labs  Lab 12/20/19 0900  INR 1.4*   MELD-Na score: 24 at 12/22/2019  8:23 AM MELD score: 23 at 12/22/2019  8:23 AM Calculated from: Serum Creatinine: 1.41 mg/dL at 12/22/2019  8:23 AM Serum Sodium: 136 mmol/L at 12/22/2019  8:23 AM Total Bilirubin: 12.4 mg/dL at 12/22/2019  8:23 AM INR(ratio): 1.4 ratio at 12/20/2019  9:00 AM Age: 72 years  Imaging Studies  IR PERCUTANEOUS TRANSHEPATIC CHOLANGIOGRAM  Result Date: 12/24/2019 INDICATION: Metastatic pancreatic carcinoma and biliary obstruction with inability to advance a biliary stent at the time of ERCP yesterday. EXAM: PLACEMENT OF INTERNAL/EXTERNAL PERCUTANEOUS BILIARY DRAINAGE CATHETER WITH CHOLANGIOGRAM MEDICATIONS: The  patient received a scheduled dose of 500 mg IV meropenem during the procedure. ANESTHESIA/SEDATION: Moderate (conscious) sedation was employed during this procedure. A total of Versed 2.0 mg and Fentanyl 100 mcg was administered intravenously. Moderate Sedation Time: 32 minutes. The patient's level of consciousness and vital signs were monitored continuously by radiology nursing throughout the procedure under my direct supervision. FLUOROSCOPY TIME:  Fluoroscopy Time: 6 minutes and 6 seconds. 63.0 mGy. COMPLICATIONS: None immediate. PROCEDURE: Informed written consent was obtained from the patient after a thorough discussion of the procedural risks, benefits and alternatives. All questions were addressed. Maximal Sterile Barrier Technique was utilized including caps, mask, sterile gowns, sterile gloves, sterile drape, hand hygiene and skin antiseptic. A timeout was performed prior to the initiation of the procedure. Ultrasound was used to localize the liver. The abdominal wall was prepped with chlorhexidine. Local anesthesia was provided with 1% lidocaine. Under direct ultrasound guidance, access of a right lobe bile duct was performed by puncture with a 21 gauge needle. Ultrasound image documentation was performed. After bile duct puncture, a cholangiogram was performed with injection of contrast. Fluoroscopic imaging was saved. A guidewire was advanced through the needle. A transitional dilator was placed. A 5 French catheter was advanced into the common bile duct over a guidewire. A hydrophilic guidewire was then advanced through the ampulla into the duodenum. The percutaneous tract was dilated and a 10 French internal/external biliary drainage catheter advanced over the wire. The distal portion of the catheter was formed in the duodenal lumen and the catheter position appropriately. The drain was injected with contrast material to confirm position. The catheter was secured at the skin with a Prolene retention  suture and StatLock device. The drain was connected to a gravity drainage bag. FINDINGS: There was some ascites present adjacent to the inferior tip of the right lobe as well as the lateral segment of the left lobe. By ultrasound, the most accessible bile duct was a right lobe duct roughly in the mid right lobe where there was no overlying ascites. Cholangiogram shows high-grade irregular strictures of central right lobe ducts extending to the intrahepatic biliary confluence with the left lobe. Irregular narrowing also is noted in several segments of the common hepatic and common bile ducts. The gallbladder has been removed. A pancreatic ductal stent is present. The distal common bile duct is very dilated and initially there was little to no flow of contrast through the ampulla. A hydrophilic guidewire did passed through segments of biliary stricture and access was able to be gained across the ampulla and to the level of the duodenum. This allowed placement of an internal/external biliary drainage catheter which was formed in the duodenal lumen with sideholes extending  up into the right lobe of the liver and across the biliary confluence. IMPRESSION: Percutaneous cholangiogram demonstrates irregular high-grade biliary strictures involving multiple central intrahepatic right lobe ducts, the biliary confluence, common hepatic duct and common bile duct. After right lobe access, strictures were able to be crossed allowing placement of a 10 French internal/external biliary drainage catheter that was formed in the duodenum. Sideholes extend up into right lobe ducts and the drainage catheter will be left to gravity bag drainage. Electronically Signed   By: Aletta Edouard M.D.   On: 12/24/2019 14:02    ASSESSMENT  [name] is a 86 y.o. female with a pmh significant for metastatic pancreatic cancer with biliary obstruction, GERD, colon polyps, nephrolithiasis, hiatal hernia, Schatzki ring.  The patient was admitted to  the hospital post ERCP attempt with unsuccessful biliary decompression and now status post percutaneous biliary drainage.  The patient is clinically and hemodynamically stable.  Liver biochemical testing was already improving status post my ERCP attempt.  Her bilirubin continues to trend downwards today.  We appreciate interventional radiology, Dr. Kathlene Cote, with being able to help with placement of the percutaneous internal biliary drain.  Hopefully her numbers will continue to improve to the point where she can have follow-up with Dr. Delton Coombes and continue therapy for her metastatic pancreatic cancer.  In the course of time, the patient may be able to be transitioned to an internal stent per the interventional radiologist in a few weeks but they will work on that moving forward.  For now continue the drainage bag catheter as per IR.  No evidence of post ERCP pancreatitis.  No evidence of postprocedural bleeding from recent ERCP based on her hemogram.  At this point in time GI will step back but be available if needed.  Is been a pleasure to meet the patient and her family in an effort of trying to help her as best we can.   PLAN/RECOMMENDATIONS  Trend LFTs daily versus every other day while inpatient Follow-up with IR for PPD management and potential internalization of stenting in future as per their protocol Follow-up with Dr. Delton Coombes for consideration of further pancreatic cancer therapies GI will sign off but please call if questions or concerns develop.   Justice Britain, MD [facility] Office # [phone]    LOS: 2 days  Irving Copas  12/25/2019, 3:12 PM

## 2019-12-25 NOTE — Progress Notes (Signed)
Dr. Maren Beach notified patient has only put out 10 ml dark brown urine via purewick. [name] is not drinking very much. Bladder Scan showed 318 ml in the bladder. Right after texting the MD, the patient was incontinent a large amount. Dr. Maren Beach called me back and was informed of this. Ms Biehler is on a low bed with floor mats and bed alarm is on. Son was with patient all afternoon.

## 2019-12-25 NOTE — Progress Notes (Signed)
PROGRESS NOTE    [name]  HMC:[id number] DOB: [birth year] DOA: 12/23/2019 PCP: Fayrene Helper, MD   Brief Narrative: 86 y.o. female past medical history of metastatic pancreatic cancer with metastases to the liver, anemia due to antineoplastic chemotherapy on Aranesp a DO NOT RESUSCITATE with the last chemotherapy on 09/22/2018 who was admitted to [facility] endoscopy suite for an ERCP for attempt biliary decompression in the setting of metastatic pancreatic cancer.  Subjective: Feels well. Per drain draining well. She denies any nausea, vomiting, abdominal pain, chest pain, shortness of breath, fever, chills, headache, focal weakness, numbness tingling, speech difficulties. Reports she was dizzy when getting up.  Assessment & Plan:  Pancreatic cancer metastasized to liver with malignant biliary obstruction GI attempted ERCP with stenting and unsuccessful, s/p PTC/biliary drainage by IR 12/25/19. On emperic Merrem but she is afebrile and hemodynamically stable,wbc stable.LFTs are trending down as below. Recent Labs  Lab 12/20/19 0900 12/22/19 0823 12/24/19 0322 12/25/19 0341  AST 81* 97* 52* 43*  ALT 53* 58* 45* 45*  ALKPHOS 250* 257* 216* 205*  BILITOT 11.5* 12.4* 7.8* 6.7*  PROT 7.7 7.6 6.6 6.8  ALBUMIN 3.0* 2.6* 2.2* 2.4*  INR 1.4*  --   --   --    Underweight: continue to augment nutrition. Aappreciate dietitian input.  Hyperkalemia- with k in ivf- cont po, stopped ivf with k- repeat bmp in am.  Hyponatremia- encourage po. Bmp in am  Deconditioning/Dizziness- pt/ot eval.  Anemia due to antineoplastic chemotherapy: Hemoglobin is stable 9.7 g.  Monitor. Recent Labs  Lab 12/20/19 0900 12/22/19 0823 12/23/19 1705 12/24/19 0322 12/25/19 0341  HGB 10.9* 10.3* 10.5* 9.7* 10.3*  HCT 31.4* 30.2* 32.0* 28.3* 30.4*   Hypertension - borderline controlled cont amlodipine  Goals of care: patient is DNR.  DVT prophylaxis: enoxaparin (LOVENOX) injection 30 mg  Start: 12/25/19 1000 Code Status:   Code Status: DNR Family Communication: plan of care discussed with patient at bedside.  Status is: Inpatient Remains inpatient appropriate because:IV treatments appropriate due to intensity of illness or inability to take PO and Inpatient level of care appropriate due to severity of illness  Dispo: The patient is from: Home              Anticipated d/c is to: Home              Anticipated d/c date is: 1 day              Patient currently is not medically stable to d/c.  Will discuss with Dr. Rush Landmark.  Diet Order            Diet full liquid Room service appropriate? Yes; Fluid consistency: Thin  Diet effective now                 Body mass index is 14.53 kg/m.  Consultants:see note  Procedures:see note Microbiology:see note Blood Culture    Component Value Date/Time   SDES  12/08/2019 1138    URINE, CLEAN CATCH Performed at [facility], [address], [place], [place] [address]    [facility]  12/08/2019 1138    NONE Performed at [facility], [address], [place], [place] [address]    CULT MULTIPLE SPECIES PRESENT, SUGGEST RECOLLECTION (A) 12/08/2019 1138   REPTSTATUS 12/09/2019 FINAL 12/08/2019 1138    Other culture-see note  Medications: Scheduled Meds: . amLODipine  10 mg Oral Daily  . Chlorhexidine Gluconate Cloth  6 each Topical Daily  . clonazePAM  0.5 mg Oral Daily  . cycloSPORINE  1 drop Both Eyes BID  . enoxaparin (LOVENOX) injection  30 mg Subcutaneous Q24H  . methylPREDNISolone  32 mg Oral Daily  . metoprolol tartrate  25 mg Oral Daily  . mirabegron ER  25 mg Oral Daily  . traMADol  50 mg Oral Q12H   Continuous Infusions: . sodium chloride    . lactated ringers 20 mL/hr at 12/23/19 0835  . meropenem (MERREM) IV 500 mg (12/24/19 2331)    Antimicrobials: Anti-infectives (From admission, onward)   Start     Dose/Rate Route Frequency Ordered Stop   12/23/19 2200  meropenem (MERREM) 500 mg in sodium  chloride 0.9 % 100 mL IVPB     Discontinue     500 mg 200 mL/hr over 30 Minutes Intravenous Every 12 hours 12/23/19 1708     12/23/19 0800  meropenem (MERREM) 1 g in sodium chloride 0.9 % 100 mL IVPB        1 g 200 mL/hr over 30 Minutes Intravenous  Once 12/23/19 0748 12/23/19 1637       Objective: Vitals: Today's Vitals   12/24/19 1418 12/24/19 2000 12/24/19 2146 12/25/19 0503  BP: (!) 169/84  (!) 152/76 (!) 163/77  Pulse: (!) 54  (!) 55 (!) 59  Resp: _0 Temp: 97.9 F (36.6 C)  97.7 F (36.5 C) 97.9 F (36.6 C)  TempSrc:   Oral Oral  SpO2: 100%  100% 100%  Weight:      Height:      PainSc:  0-No pain      Intake/Output Summary (Last 24 hours) at 12/25/2019 0952 Last data filed at 12/25/2019 0914 Gross per 24 hour  Intake --  Output 850 ml  Net -850 ml   Filed Weights   12/19/19 1402 12/23/19 0745  Weight: 41.7 kg 37.2 kg   Weight change:    Intake/Output from previous day: 08/14 0701 - 08/15 0700 In: -  Out: 525 [Urine:400; Drains:125] Intake/Output this shift: Total I/O In: -  Out: 325 [Urine:325]  Examination:  General exam: AAO, ill frail looking,  NAD, weak appearing. HEENT:Oral mucosa moist, Ear/Nose WNL grossly, dentition normal. Respiratory system: bilaterally clear,no wheezing or crackles,no use of accessory muscle Cardiovascular system: S1 & S2 +, No JVD,. Gastrointestinal system: Abdomen soft, NT,ND, BS+ Nervous System:Alert, awake, moving extremities and grossly nonfocal Extremities: No edema, distal peripheral pulses palpable.  Skin: No rashes,no icterus. MSK: Normal muscle bulk,tone, power  Data Reviewed: I have personally reviewed following labs and imaging studies CBC: Recent Labs  Lab 12/20/19 0900 12/22/19 0823 12/23/19 1705 12/24/19 0322 12/25/19 0341  WBC 8.5 9.6 6.0 5.4 9.7  NEUTROABS 6.2 8.1*  --  5.0  --   HGB 10.9* 10.3* 10.5* 9.7* 10.3*  HCT 31.4* 30.2* 32.0* 28.3* 30.4*  MCV 98.8 97.7 101.3* 98.3 99.0  PLT  153.0 157 142* 141* 707   Basic Metabolic Panel: Recent Labs  Lab 12/20/19 0900 12/22/19 0823 12/23/19 1705 12/24/19 0322 12/25/19 0341  NA 136 136  --  134* 129*  K 4.3 4.6  --  4.9 5.2*  CL 99 100  --  102 97*  CO2 25 23  --  22 23  GLUCOSE 128* 108*  --  162* 153*  BUN 27* 32*  --  25* 24*  CREATININE 1.59* 1.41* 1.19* 1.15* 1.02*  CALCIUM 8.8 8.5*  --  7.5* 8.0*  MG  --  2.0  --   --   --  GFR: Estimated Creatinine Clearance: 22 mL/min (A) (by C-G formula based on SCr of 1.02 mg/dL (H)). Liver Function Tests: Recent Labs  Lab 12/20/19 0900 12/22/19 0823 12/24/19 0322 12/25/19 0341  AST 81* 97* 52* 43*  ALT 53* 58* 45* 45*  ALKPHOS 250* 257* 216* 205*  BILITOT 11.5* 12.4* 7.8* 6.7*  PROT 7.7 7.6 6.6 6.8  ALBUMIN 3.0* 2.6* 2.2* 2.4*   No results for input(s): LIPASE, AMYLASE in the last 168 hours. No results for input(s): AMMONIA in the last 168 hours. Coagulation Profile: Recent Labs  Lab 12/20/19 0900  INR 1.4*   Cardiac Enzymes: No results for input(s): CKTOTAL, CKMB, CKMBINDEX, TROPONINI in the last 168 hours. BNP (last 3 results) No results for input(s): PROBNP in the last 8760 hours. HbA1C: No results for input(s): HGBA1C in the last 72 hours. CBG: No results for input(s): GLUCAP in the last 168 hours. Lipid Profile: No results for input(s): CHOL, HDL, LDLCALC, TRIG, CHOLHDL, LDLDIRECT in the last 72 hours. Thyroid Function Tests: No results for input(s): TSH, T4TOTAL, FREET4, T3FREE, THYROIDAB in the last 72 hours. Anemia Panel: No results for input(s): VITAMINB12, FOLATE, FERRITIN, TIBC, IRON, RETICCTPCT in the last 72 hours. Sepsis Labs: No results for input(s): PROCALCITON, LATICACIDVEN in the last 168 hours.  Recent Results (from the past 240 hour(s))  SARS CORONAVIRUS 2 (TAT 6-24 HRS) Nasopharyngeal Nasopharyngeal Swab     Status: None   Collection Time: 12/20/19  9:43 AM   Specimen: Nasopharyngeal Swab  Result Value Ref Range Status    SARS Coronavirus 2 NEGATIVE NEGATIVE Final    Comment: (NOTE) SARS-CoV-2 target nucleic acids are NOT DETECTED.  The SARS-CoV-2 RNA is generally detectable in upper and lower respiratory specimens during the acute phase of infection. Negative results do not preclude SARS-CoV-2 infection, do not rule out co-infections with other pathogens, and should not be used as the sole basis for treatment or other patient management decisions. Negative results must be combined with clinical observations, patient history, and epidemiological information. The expected result is Negative.  Fact Sheet for Patients: SugarRoll.be  Fact Sheet for Healthcare Providers: https://www.woods-mathews.com/  This test is not yet approved or cleared by the [place] FDA and  has been authorized for detection and/or diagnosis of SARS-CoV-2 by FDA under an Emergency Use Authorization (EUA). This EUA will remain  in effect (meaning this test can be used) for the duration of the COVID-19 declaration under Se ction 564(b)(1) of the Act, 21 U.S.C. section 360bbb-3(b)(1), unless the authorization is terminated or revoked sooner.  Performed at [facility], [place] [address], [place], [place] [address]       Radiology Studies: DG ERCP BILIARY & PANCREATIC DUCTS  Result Date: 12/24/2019 CLINICAL DATA:  86 year old female with a history pancreatic cancer EXAM: ERCP TECHNIQUE: Multiple spot images obtained with the fluoroscopic device and submitted for interpretation post-procedure. FLUOROSCOPY TIME:  Fluoroscopy Time:  18 minutes 16 seconds COMPARISON:  MRI 12/16/2019 FINDINGS: Limited intraoperative fluoroscopic spot images during ERCP. Initial image demonstrates endoscope projecting over the upper abdomen. Surgical changes of cholecystectomy, as well as additional surgical clips within the mid abdomen. Subsequently there is cannulation of the ampulla and retrograde of  infusion of contrast partially opacifying the intrahepatic and extrahepatic biliary ducts. Balloon angioplasty balloon appears to have been deployed. IMPRESSION: Limited images during ERCP, as above. Please refer to the dictated operative report for full details of intraoperative findings and procedure. Electronically Signed   By: Corrie Mckusick D.O.  On: 12/24/2019 09:12   IR PERCUTANEOUS TRANSHEPATIC CHOLANGIOGRAM  Result Date: 12/24/2019 INDICATION: Metastatic pancreatic carcinoma and biliary obstruction with inability to advance a biliary stent at the time of ERCP yesterday. EXAM: PLACEMENT OF INTERNAL/EXTERNAL PERCUTANEOUS BILIARY DRAINAGE CATHETER WITH CHOLANGIOGRAM MEDICATIONS: The patient received a scheduled dose of 500 mg IV meropenem during the procedure. ANESTHESIA/SEDATION: Moderate (conscious) sedation was employed during this procedure. A total of Versed 2.0 mg and Fentanyl 100 mcg was administered intravenously. Moderate Sedation Time: 32 minutes. The patient's level of consciousness and vital signs were monitored continuously by radiology nursing throughout the procedure under my direct supervision. FLUOROSCOPY TIME:  Fluoroscopy Time: 6 minutes and 6 seconds. 63.0 mGy. COMPLICATIONS: None immediate. PROCEDURE: Informed written consent was obtained from the patient after a thorough discussion of the procedural risks, benefits and alternatives. All questions were addressed. Maximal Sterile Barrier Technique was utilized including caps, mask, sterile gowns, sterile gloves, sterile drape, hand hygiene and skin antiseptic. A timeout was performed prior to the initiation of the procedure. Ultrasound was used to localize the liver. The abdominal wall was prepped with chlorhexidine. Local anesthesia was provided with 1% lidocaine. Under direct ultrasound guidance, access of a right lobe bile duct was performed by puncture with a 21 gauge needle. Ultrasound image documentation was performed. After bile  duct puncture, a cholangiogram was performed with injection of contrast. Fluoroscopic imaging was saved. A guidewire was advanced through the needle. A transitional dilator was placed. A 5 French catheter was advanced into the common bile duct over a guidewire. A hydrophilic guidewire was then advanced through the ampulla into the duodenum. The percutaneous tract was dilated and a 10 French internal/external biliary drainage catheter advanced over the wire. The distal portion of the catheter was formed in the duodenal lumen and the catheter position appropriately. The drain was injected with contrast material to confirm position. The catheter was secured at the skin with a Prolene retention suture and StatLock device. The drain was connected to a gravity drainage bag. FINDINGS: There was some ascites present adjacent to the inferior tip of the right lobe as well as the lateral segment of the left lobe. By ultrasound, the most accessible bile duct was a right lobe duct roughly in the mid right lobe where there was no overlying ascites. Cholangiogram shows high-grade irregular strictures of central right lobe ducts extending to the intrahepatic biliary confluence with the left lobe. Irregular narrowing also is noted in several segments of the common hepatic and common bile ducts. The gallbladder has been removed. A pancreatic ductal stent is present. The distal common bile duct is very dilated and initially there was little to no flow of contrast through the ampulla. A hydrophilic guidewire did passed through segments of biliary stricture and access was able to be gained across the ampulla and to the level of the duodenum. This allowed placement of an internal/external biliary drainage catheter which was formed in the duodenal lumen with sideholes extending up into the right lobe of the liver and across the biliary confluence. IMPRESSION: Percutaneous cholangiogram demonstrates irregular high-grade biliary strictures  involving multiple central intrahepatic right lobe ducts, the biliary confluence, common hepatic duct and common bile duct. After right lobe access, strictures were able to be crossed allowing placement of a 10 French internal/external biliary drainage catheter that was formed in the duodenum. Sideholes extend up into right lobe ducts and the drainage catheter will be left to gravity bag drainage. Electronically Signed   By: Jenness Corner.D.  On: 12/24/2019 14:02     LOS: 2 days   Antonieta Pert, MD [facility]  12/25/2019, 9:52 AM

## 2019-12-25 NOTE — Evaluation (Signed)
Physical Therapy Evaluation Patient Details Name: [name] MRN: [id number] DOB: August 24, 1928 Today's Date: 12/25/2019   History of Present Illness  86 y.o. female past medical history of metastatic pancreatic cancer with metastases to the liver, anemia due to antineoplastic chemotherapy on Aranesp, last chemo on 09/22/2018 who was admitted to [facility] endoscopy suite for an ERCP for attempt biliary decompression in the setting of metastatic pancreatic cancer. S/p Internal/External biliary drainage catheter placement with cholangiogram 8/14.  Clinical Impression  Pt admitted as above and presenting with functional mobility limitations 2* balance deficits with ambulation.  Prior to admit pt ambulating independently or with cane as needed but this date pt demonstrating instability all directions with widened BOS to compensate and requiring assist to prevent falling.  Pt very motivated to return home with assist of son who she lives with and would benefit from follow up HHPT to further address deficits dependent on acute stay progress.    Follow Up Recommendations Home health PT    Equipment Recommendations  Other (comment) (TBD )    Recommendations for Other Services       Precautions / Restrictions Precautions Precautions: Fall Precaution Comments: R sded drain, hx of dizziness Restrictions Weight Bearing Restrictions: No      Mobility  Bed Mobility Overal bed mobility: Needs Assistance Bed Mobility: Supine to Sit     Supine to sit: Min assist     General bed mobility comments: min assist for trunk 2* discomfort  Transfers Overall transfer level: Needs assistance Equipment used: Rolling walker (2 wheeled);2 person hand held assist Transfers: Sit to/from Stand Sit to Stand: Min guard         General transfer comment: Steady assist with widened BOS  Ambulation/Gait Ambulation/Gait assistance: Min assist;+2 physical assistance;+2 safety/equipment Gait Distance  (Feet): 300 Feet Assistive device: Rolling walker (2 wheeled);2 person hand held assist Gait Pattern/deviations: Step-through pattern;Decreased step length - left;Decreased step length - right;Shuffle;Wide base of support     General Gait Details: General instability with widened BOS to compensate and assist required to prevent fall.  Pt ambulated 55' with RW but significant difficulty managing device and moved to HHA of 2 to ambulate additional 250'  Stairs            Wheelchair Mobility    Modified Rankin (Stroke Patients Only)       Balance Overall balance assessment: Needs assistance Sitting-balance support: No upper extremity supported;Feet supported Sitting balance-Leahy Scale: Good     Standing balance support: Single extremity supported Standing balance-Leahy Scale: Poor                               Pertinent Vitals/Pain Pain Assessment: Faces Faces Pain Scale: Hurts a little bit Pain Location: R flank Pain Intervention(s): Limited activity within patient's tolerance;Monitored during session    Home Living Family/patient expects to be discharged to:: Private residence Living Arrangements: Children Available Help at Discharge: Family;Available 24 hours/day Type of Home: House Home Access: Stairs to enter Entrance Stairs-Rails: Right Entrance Stairs-Number of Steps: 3 Home Layout: One level Home Equipment: Kasandra Knudsen - single point Additional Comments: Patient reports she lives with her son who takes care of IADLs. Patient reports independence with ADLs and performs tub baths.    Prior Function Level of Independence: Independent;Independent with assistive device(s)         Comments: Reports she socializes with family, walking to their homes and playing cards.  Pt uses  cane as needed     Hand Dominance        Extremity/Trunk Assessment   Upper Extremity Assessment Upper Extremity Assessment: Overall WFL for tasks assessed    Lower  Extremity Assessment Lower Extremity Assessment: Overall WFL for tasks assessed    Cervical / Trunk Assessment Cervical / Trunk Assessment: Normal  Communication   Communication: No difficulties  Cognition Arousal/Alertness: Awake/alert Behavior During Therapy: WFL for tasks assessed/performed Overall Cognitive Status: Within Functional Limits for tasks assessed                                        General Comments      Exercises     Assessment/Plan    PT Assessment Patient needs continued PT services  PT Problem List Decreased balance;Decreased mobility;Decreased knowledge of use of DME;Pain;Decreased safety awareness       PT Treatment Interventions DME instruction;Gait training;Stair training;Functional mobility training;Therapeutic activities;Therapeutic exercise;Balance training;Patient/family education    PT Goals (Current goals can be found in the Care Plan section)  Acute Rehab PT Goals Patient Stated Goal: play cards and visit with family PT Goal Formulation: With patient Time For Goal Achievement: 01/08/20 Potential to Achieve Goals: Good    Frequency Min 3X/week   Barriers to discharge        Co-evaluation PT/OT/SLP Co-Evaluation/Treatment: Yes Reason for Co-Treatment: For patient/therapist safety PT goals addressed during session: Mobility/safety with mobility OT goals addressed during session: ADL's and self-care       AM-PAC PT "6 Clicks" Mobility  Outcome Measure Help needed turning from your back to your side while in a flat bed without using bedrails?: A Little Help needed moving from lying on your back to sitting on the side of a flat bed without using bedrails?: A Little Help needed moving to and from a bed to a chair (including a wheelchair)?: A Little Help needed standing up from a chair using your arms (e.g., wheelchair or bedside chair)?: A Little Help needed to walk in hospital room?: A Little Help needed climbing 3-5  steps with a railing? : A Lot 6 Click Score: 17    End of Session Equipment Utilized During Treatment: Gait belt Activity Tolerance: Patient tolerated treatment well Patient left: in chair;with call bell/phone within reach;with chair alarm set Nurse Communication: Mobility status PT Visit Diagnosis: Difficulty in walking, not elsewhere classified (R26.2);Pain;Unsteadiness on feet (R26.81) Pain - Right/Left: Right    Time: 1206-1232 PT Time Calculation (min) (ACUTE ONLY): 26 min   Charges:   PT Evaluation $PT Eval Low Complexity: 1 Low          [place] Pager [phone] Office [phone]   Tasnim Balentine 12/25/2019, 2:38 PM

## 2019-12-25 NOTE — Evaluation (Signed)
Occupational Therapy Evaluation Patient Details Name: [name] MRN: [id number] DOB: Oct 11, 1928 Today's Date: 12/25/2019    History of Present Illness 86 y.o. female past medical history of metastatic pancreatic cancer with metastases to the liver, anemia due to antineoplastic chemotherapy on Aranesp, last chemo on 09/22/2018 who was admitted to [facility] endoscopy suite for an ERCP for attempt biliary decompression in the setting of metastatic pancreatic cancer. S/p Internal/External biliary drainage catheter placement with cholangiogram 8/14.   Clinical Impression   [name] is an 86 year old woman who presents with above diagnosis and s/p drain placement who exhibits generalized weakness and decreased activity tolerance compared to baseline resulting in decreased independence with ADLs and mobility. Patient reports pain in right flank area and a history of getting dizzy with standing after eating. Patient min assist for ADLs and ambulation. Patient will benefit from skilled OT services while in hospital to improve deficits in order to return home at discharge. Unlikely that patient will need OT services at discharge.    Follow Up Recommendations  No OT follow up    Equipment Recommendations  None recommended by OT    Recommendations for Other Services       Precautions / Restrictions Precautions Precaution Comments: R sded drain, hx of dizziness      Mobility Bed Mobility Overal bed mobility: Needs Assistance Bed Mobility: Supine to Sit     Supine to sit: Min assist        Transfers Overall transfer level: Needs assistance Equipment used: Rolling walker (2 wheeled);2 person hand held assist Transfers: Sit to/from Stand Sit to Stand: Min guard         General transfer comment: Min guard to stand from bed with use of RW. Initial ambulation attempted with RW but transitione to two hand hold assist.    Balance Overall balance assessment: Needs  assistance Sitting-balance support: No upper extremity supported;Feet supported Sitting balance-Leahy Scale: Good     Standing balance support: Single extremity supported Standing balance-Leahy Scale: Poor                             ADL either performed or assessed with clinical judgement   ADL Overall ADL's : Needs assistance/impaired Eating/Feeding: Independent   Grooming: Wash/dry hands;Sitting;Wash/dry face   Upper Body Bathing: Set up;Sitting   Lower Body Bathing: Minimal assistance;Sit to/from stand   Upper Body Dressing : Sitting;Set up   Lower Body Dressing: Minimal assistance;Sit to/from stand   Toilet Transfer: Minimal assistance;Ambulation;Grab bars;Regular Toilet   Toileting- Clothing Manipulation and Hygiene: Minimal assistance;Sit to/from stand       Functional mobility during ADLs: Minimal assistance General ADL Comments: Hand hold assist for ambulation.     Vision Patient Visual Report: No change from baseline Vision Assessment?: No apparent visual deficits     Perception     Praxis      Pertinent Vitals/Pain Pain Assessment: Faces Faces Pain Scale: Hurts a little bit Pain Location: R flank Pain Intervention(s): Monitored during session     Hand Dominance     Extremity/Trunk Assessment Upper Extremity Assessment Upper Extremity Assessment: Overall WFL for tasks assessed   Lower Extremity Assessment Lower Extremity Assessment: Defer to PT evaluation   Cervical / Trunk Assessment Cervical / Trunk Assessment: Normal   Communication Communication Communication: No difficulties   Cognition Arousal/Alertness: Awake/alert Behavior During Therapy: WFL for tasks assessed/performed Overall Cognitive Status: Within Functional Limits for tasks assessed  General Comments       Exercises     Shoulder Instructions      Home Living Family/patient expects to be discharged to::  Private residence Living Arrangements: Children Available Help at Discharge: Family;Available 24 hours/day Type of Home: House Home Access: Stairs to enter [employer] of Steps: 3 Entrance Stairs-Rails: Right Home Layout: One level     Bathroom Shower/Tub: Teacher, early years/pre: Standard     Home Equipment: Kasandra Knudsen - single point   Additional Comments: Patient reports she lives with her son who takes care of IADLs. Patient reports independence with ADLs and performs tub baths.      Prior Functioning/Environment Level of Independence: Independent        Comments: Reports she socializes with family, walking to their homes and playing cards.        OT Problem List: Decreased activity tolerance;Impaired balance (sitting and/or standing);Pain      OT Treatment/Interventions: Self-care/ADL training;Therapeutic exercise;DME and/or AE instruction;Therapeutic activities;Balance training;Patient/family education    OT Goals(Current goals can be found in the care plan section) Acute Rehab OT Goals Patient Stated Goal: play cards with family OT Goal Formulation: With patient Time For Goal Achievement: 01/08/20 Potential to Achieve Goals: Good  OT Frequency: Min 2X/week   Barriers to D/C:            Co-evaluation PT/OT/SLP Co-Evaluation/Treatment: Yes Reason for Co-Treatment: For patient/therapist safety;To address functional/ADL transfers PT goals addressed during session: Mobility/safety with mobility OT goals addressed during session: ADL's and self-care      AM-PAC OT "6 Clicks" Daily Activity     Outcome Measure Help from another person eating meals?: None Help from another person taking care of personal grooming?: A Little Help from another person toileting, which includes using toliet, bedpan, or urinal?: A Little Help from another person bathing (including washing, rinsing, drying)?: A Little Help from another person to put on and taking off  regular upper body clothing?: A Little Help from another person to put on and taking off regular lower body clothing?: A Little 6 Click Score: 19   End of Session Equipment Utilized During Treatment: Gait belt;Rolling walker Nurse Communication: Mobility status (chair alarm w/ wall cord.)  Activity Tolerance: Patient tolerated treatment well Patient left: in chair;with call bell/phone within reach;with chair alarm set  OT Visit Diagnosis: Unsteadiness on feet (R26.81);Muscle weakness (generalized) (M62.81);Pain Pain - part of body:  (R flank)                Time: 1206-1228 OT Time Calculation (min): 22 min Charges:  OT General Charges $OT Visit: 1 Visit OT Evaluation $OT Eval Low Complexity: 1 Low  Eli Pattillo, OTR/L [place]  Office [phone] Pager: [phone]   Lenward Chancellor 12/25/2019, 12:53 PM

## 2019-12-26 ENCOUNTER — Telehealth: Payer: Self-pay

## 2019-12-26 DIAGNOSIS — T85528S Displacement of other gastrointestinal prosthetic devices, implants and grafts, sequela: Secondary | ICD-10-CM

## 2019-12-26 DIAGNOSIS — Z515 Encounter for palliative care: Secondary | ICD-10-CM

## 2019-12-26 DIAGNOSIS — Z7189 Other specified counseling: Secondary | ICD-10-CM

## 2019-12-26 LAB — CBC
HCT: 30.9 % — ABNORMAL LOW (ref 36.0–46.0)
Hemoglobin: 10.9 g/dL — ABNORMAL LOW (ref 12.0–15.0)
MCH: 34.2 pg — ABNORMAL HIGH (ref 26.0–34.0)
MCHC: 35.3 g/dL (ref 30.0–36.0)
MCV: 96.9 fL (ref 80.0–100.0)
Platelets: 156 10*3/uL (ref 150–400)
RBC: 3.19 MIL/uL — ABNORMAL LOW (ref 3.87–5.11)
RDW: 16.8 % — ABNORMAL HIGH (ref 11.5–15.5)
WBC: 9.7 10*3/uL (ref 4.0–10.5)
nRBC: 0 % (ref 0.0–0.2)

## 2019-12-26 LAB — BASIC METABOLIC PANEL
Anion gap: 9 (ref 5–15)
BUN: 30 mg/dL — ABNORMAL HIGH (ref 8–23)
CO2: 25 mmol/L (ref 22–32)
Calcium: 7.9 mg/dL — ABNORMAL LOW (ref 8.9–10.3)
Chloride: 96 mmol/L — ABNORMAL LOW (ref 98–111)
Creatinine, Ser: 0.94 mg/dL (ref 0.44–1.00)
GFR calc Af Amer: >60 mL/min (ref >60)
GFR calc non Af Amer: 54 mL/min — ABNORMAL LOW (ref >60)
Glucose, Bld: 99 mg/dL (ref 70–99)
Potassium: 4.8 mmol/L (ref 3.5–5.1)
Sodium: 130 mmol/L — ABNORMAL LOW (ref 135–145)

## 2019-12-26 LAB — COMPREHENSIVE METABOLIC PANEL
ALT: 45 U/L — ABNORMAL HIGH (ref 0–44)
AST: 37 U/L (ref 15–41)
Albumin: 2.5 g/dL — ABNORMAL LOW (ref 3.5–5.0)
Alkaline Phosphatase: 179 U/L — ABNORMAL HIGH (ref 38–126)
Anion gap: 8 (ref 5–15)
BUN: 29 mg/dL — ABNORMAL HIGH (ref 8–23)
CO2: 25 mmol/L (ref 22–32)
Calcium: 8.2 mg/dL — ABNORMAL LOW (ref 8.9–10.3)
Chloride: 96 mmol/L — ABNORMAL LOW (ref 98–111)
Creatinine, Ser: 0.89 mg/dL (ref 0.44–1.00)
GFR calc Af Amer: >60 mL/min (ref >60)
GFR calc non Af Amer: 57 mL/min — ABNORMAL LOW (ref >60)
Glucose, Bld: 109 mg/dL — ABNORMAL HIGH (ref 70–99)
Potassium: 4.7 mmol/L (ref 3.5–5.1)
Sodium: 129 mmol/L — ABNORMAL LOW (ref 135–145)
Total Bilirubin: 6.4 mg/dL — ABNORMAL HIGH (ref 0.3–1.2)
Total Protein: 6.5 g/dL (ref 6.5–8.1)

## 2019-12-26 MED ORDER — "GAUZE PADS 4""X4"" PADS": MEDICATED_PAD | 0 refills | Status: AC

## 2019-12-26 MED ORDER — SODIUM CHLORIDE 1 G PO TABS
2.0000 g | ORAL_TABLET | Freq: Once | ORAL | Status: AC
  Administered 2019-12-26: 2 g via ORAL
  Filled 2019-12-26: qty 2

## 2019-12-26 MED ORDER — METRONIDAZOLE 500 MG PO TABS: 500.0000 mg | ORAL_TABLET | Freq: Three times a day (TID) | ORAL | 0 refills | Status: AC

## 2019-12-26 MED ORDER — SODIUM CHLORIDE FLUSH 0.9 % IV SOLN: 10.0000 mL | Freq: Every day | INTRAVENOUS | 0 refills | Status: AC

## 2019-12-26 MED ORDER — CEFDINIR 300 MG PO CAPS: 300.0000 mg | ORAL_CAPSULE | Freq: Every day | ORAL | 0 refills | Status: AC

## 2019-12-26 MED ORDER — CEFDINIR 300 MG PO CAPS: 300.0000 mg | ORAL_CAPSULE | Freq: Two times a day (BID) | ORAL | Status: DC

## 2019-12-26 MED ORDER — METRONIDAZOLE 500 MG PO TABS: 500.0000 mg | ORAL_TABLET | Freq: Three times a day (TID) | ORAL | Status: DC

## 2019-12-26 MED ORDER — CEFDINIR 300 MG PO CAPS
300.0000 mg | ORAL_CAPSULE | Freq: Every day | ORAL | Status: DC
  Administered 2019-12-26: 300 mg via ORAL
  Filled 2019-12-26: qty 1

## 2019-12-26 MED ORDER — HEPARIN SOD (PORK) LOCK FLUSH 100 UNIT/ML IV SOLN
500.0000 [IU] | INTRAVENOUS | Status: AC | PRN
  Administered 2019-12-26: 500 [IU]
  Filled 2019-12-26: qty 5

## 2019-12-26 MED ORDER — CEFDINIR 300 MG PO CAPS: 300.0000 mg | ORAL_CAPSULE | Freq: Every day | ORAL | Status: DC

## 2019-12-26 NOTE — Care Management Important Message (Signed)
Important Message  Patient Details IM Letter given to the Patient Name: [name] MRN: [id number] Date of Birth: Sep 28, 1928   Medicare Important Message Given:  Yes     Kerin Salen 12/26/2019, 9:59 AM

## 2019-12-26 NOTE — Telephone Encounter (Signed)
The pt is currently admitted.  FYI

## 2019-12-26 NOTE — Telephone Encounter (Signed)
-----   Message from Irving Copas., MD sent at 12/24/2019  3:08 AM EDT ----- Chong Sicilian, Patient needs a KUB to ensure pancreatic stent has fallen out. Thanks. GM

## 2019-12-26 NOTE — Discharge Summary (Signed)
Physician Discharge Summary  [name] YBF:[id number] DOB: 07/29/1928 DOA: 12/23/2019  PCP: Fayrene Helper, MD  Admit date: 12/23/2019 Discharge date: 12/26/2019  Admitted From:home Disposition:home  Recommendations for Outpatient Follow-up:  Follow up with PCP in 1-2 weeks Please obtain BMP/CBC in one week Please follow up on the following pending results:  Home Health:No  Equipment/Devices: None  Discharge Condition: Stable Code Status:   Code Status: DNR Diet recommendation:  Diet Order             Diet - low sodium heart healthy           Diet Heart Room service appropriate? Yes; Fluid consistency: Thin  Diet effective now                    Brief/Interim Summary: 86 y.o. female past medical history of metastatic pancreatic cancer with metastases to the liver, anemia due to antineoplastic chemotherapy on Aranesp a DO NOT RESUSCITATE with the last chemotherapy on 09/22/2018 who was admitted to [facility] endoscopy suite for an ERCP for attempt biliary decompression in the setting of metastatic pancreatic cancer. ERCP failed subsequently underwent IR percutaneous biliary drainage. Postprocedure did well initially with mild hyponatremia uptrending at this time tolerating p.o. well patient and son wanting to go home and will follow up and they feel good about patient's improving LFTs. Advise repeat CMP in 3 to 4 days.  Discharge Diagnoses:  Active Problems:   Pancreatic cancer metastasized to liver [facility])   Anemia due to antineoplastic chemotherapy   Palliative care encounter   Biliary obstruction due to cancer [facility])   Pancreatic cancer [facility]) Pancreatic cancer metastasized to liver with malignant biliary obstruction GI attempted ERCP with stenting and unsuccessful, s/p PTC/biliary drainage by IR 12/25/19. On emperic Merrem discussed with GI advised total 5 days of antibiotics pharmacy changed to cefdinir and Flagyl.  Follow-up with her oncology at [facility] and  PCP and follow-up liver markers.  Underweight: continue to augment nutrition.  Seen by dietitian..   Hyperkalemia-resolved.     Hyponatremia- encourage po intake, repeat BMP at 130 sodium.  Patient tolerating diet well.  Advised REPEAT CMP IN 3-4 DAYS BY PCP OR HEMONC   Deconditioning/Dizziness- pt/ot eval. advised home health but son is taking care of her and at this time not interested in home health.   Anemia due to antineoplastic chemotherapy: Hemoglobin is stable at 10.9 g.  Monitor   Hypertension - cont her amlodipine   Goals of care: patient is DNR. Spoke to [employer]  who has ordered d/c instruction for drain management at home.  Consults: GI, IR  Subjective: Feeling well tolerating diet no nausea vomiting or abdominal pain.  Discharge Exam: Vitals:   12/26/19 0558 12/26/19 1328  BP: (!) 149/78 (!) 147/101  Pulse: 61 (!) 104  Resp: 18 14  Temp: (!) 97.5 F (36.4 C) (!) 97.5 F (36.4 C)  SpO2: 100% 100%   General: Pt is alert, awake, not in acute distress Cardiovascular: RRR, S1/S2 +, no rubs, no gallops Respiratory: CTA bilaterally, no wheezing, no rhonchi Abdominal: Soft, NT, ND, bowel sounds + Extremities: no edema, no cyanosis Percutaneous biliary drainage with intact drainage.  Discharge Instructions  Discharge Instructions     Diet - low sodium heart healthy   Complete by: As directed    Discharge instructions   Complete by: As directed    Please call call MD or return to ER for similar or worsening recurring problem that brought  you to hospital or if any fever,nausea/vomiting,abdominal pain, uncontrolled pain, chest pain,  shortness of breath or any other alarming symptoms.  Please follow-up your doctor as instructed in a week time and call the office for appointment.  Please check liver function test/CMP  in 3-4 days Please avoid alcohol, smoking, or any other illicit substance and maintain healthy habits including taking your regular medications as  prescribed.  You were cared for by a hospitalist during your hospital stay. If you have any questions about your discharge medications or the care you received while you were in the hospital after you are discharged, you can call the unit and ask to speak with the hospitalist on call if the hospitalist that took care of you is not available.  Once you are discharged, your primary care physician will handle any further medical issues. Please note that NO REFILLS for any discharge medications will be authorized once you are discharged, as it is imperative that you return to your primary care physician (or establish a relationship with a primary care physician if you do not have one) for your aftercare needs so that they can reassess your need for medications and monitor your lab values   Increase activity slowly   Complete by: As directed       Allergies as of 12/26/2019       Reactions   Iohexol Hives, Shortness Of Breath   **Patient can not have IV contrast, pt has breakthrough reaction even after 13 hour premeds** Do not give IV contrast PATIENT HAD TO BE TAKEN TO ED, C/O WHELPS, HIVES, DIFFICULTY BREATHING. PATIENT GIVEN IV CONTRAST AFTER 13 HOUR PRE MEDS ON 01/09/2017 WITH NO REACTION NOTED. On 10/19/17 patient had reaction despite premedication.    Aciphex [rabeprazole Sodium] Other (See Comments)   Unknown reaction   Amlodipine Besylate-valsartan    Rash to high-dose (5/320), can take plain amlodipine    Esomeprazole Magnesium    Unknown reaction   Omeprazole Other (See Comments)   Patient states "medication didn't work"   Ciprofloxacin Rash   Penicillins Swelling, Rash   Has patient had a PCN reaction causing immediate rash, facial/tongue/throat swelling, SOB or lightheadedness with hypotension: Yes Has patient had a PCN reaction causing severe rash involving mucus membranes or skin necrosis: Yes Has patient had a PCN reaction that required hospitalization: No Has patient had a PCN  reaction occurring within the last 10 years: yes If all of the above answers are "NO", then may proceed with Cephalosporin use.   Tessalon Perles [benzonatate] Itching   Full head, face, neck, and chest itching.         Medication List     TAKE these medications    amLODipine 10 MG tablet Commonly known as: NORVASC TAKE 1 TABLET DAILY   cefdinir 300 MG capsule Commonly known as: OMNICEF Take 1 capsule (300 mg total) by mouth daily for 3 days.   CISPLATIN IV Inject into the vein. Days 1, 8 every 21 days   clonazePAM 0.5 MG tablet Commonly known as: KLONOPIN Take 1 tablet (0.5 mg total) by mouth at bedtime. What changed: when to take this   cycloSPORINE 0.05 % ophthalmic emulsion Commonly known as: RESTASIS Place 1 drop into both eyes 2 (two) times daily.   ergocalciferol 1.25 MG (50000 UT) capsule Commonly known as: VITAMIN D2 Take 1 capsule (50,000 Units total) by mouth once a week. What changed: when to take this   fluticasone 0.05 % cream Commonly known as: CUTIVATE  Apply 1 application topically daily as needed (rash).   GAUZE PADS 4"X4" 4"X4" Pads Use gauze for drain management.   GEMCITABINE HCL IV Inject into the vein. Days 1, 8 every 21 days   HYDROcodone-acetaminophen 5-325 MG tablet Commonly known as: Norco Take 1 tablet by mouth every 8 (eight) hours as needed for moderate pain. What changed: how much to take   lidocaine 5 % Commonly known as: LIDODERM Place 1 patch onto the skin daily. Remove & Discard patch within 12 hours or as directed by MD   lidocaine-prilocaine cream Commonly known as: EMLA Apply 1 application topically as needed. Apply to portacath site as needed What changed:  when to take this reasons to take this additional instructions   loperamide 2 MG tablet Commonly known as: IMODIUM A-D Take 2 mg by mouth daily.   metoprolol tartrate 25 MG tablet Commonly known as: LOPRESSOR TAKE 1 TABLET DAILY FOR BLOOD PRESSURE (DOSE  REDUCTION) What changed:  how much to take how to take this when to take this additional instructions   metroNIDAZOLE 500 MG tablet Commonly known as: FLAGYL Take 1 tablet (500 mg total) by mouth every 8 (eight) hours for 3 days.   Myrbetriq 50 MG Tb24 tablet Generic drug: mirabegron ER Take 50 mg by mouth daily.   oxybutynin 10 MG 24 hr tablet Commonly known as: DITROPAN-XL TAKE 1 TABLET(5 MG) BY MOUTH AT BEDTIME   pantoprazole 20 MG tablet Commonly known as: PROTONIX TAKE 1 TABLET DAILY   potassium chloride 10 MEQ tablet Commonly known as: KLOR-CON Take 2 tablets (20 mEq total) by mouth daily.   sodium chloride flush 0.9 % Soln injection Inject 10 mLs into the vein daily for 14 doses.   traMADol 50 MG tablet Commonly known as: ULTRAM Take 1 tablet (50 mg total) by mouth 3 (three) times daily.        Follow-up Information     Fayrene Helper, MD Follow up in 1 week(s).   Specialty: Family Medicine Why: LFT check Contact information: [address], [address] [place] [address] [phone]         Derek Jack, MD. Call in 2 day(s).   Specialty: Hematology Contact information: [place] [address] [place] Follow up.   Why: Please follow up with [facility] Radiology at [facility] in 8 weeks. A scheduler from our office will call you to arrange this appointment. Please flush drain once daily with 5 cc NS. Document output daily.  Contact information: [place] [address] [phone]                Allergies  Allergen Reactions   Iohexol Hives and Shortness Of Breath    **Patient can not have IV contrast, pt has breakthrough reaction even after 13 hour premeds** Do not give IV contrast PATIENT HAD TO BE TAKEN TO ED, C/O WHELPS, HIVES, DIFFICULTY BREATHING. PATIENT GIVEN IV CONTRAST AFTER 13 HOUR PRE MEDS ON 01/09/2017 WITH NO REACTION  NOTED. On 10/19/17 patient had reaction despite premedication.     Aciphex [Rabeprazole Sodium] Other (See Comments)    Unknown reaction   Amlodipine Besylate-Valsartan     Rash to high-dose (5/320), can take plain amlodipine    Esomeprazole Magnesium     Unknown reaction   Omeprazole Other (See Comments)    Patient states "medication didn't work"   Ciprofloxacin Rash  Penicillins Swelling and Rash    Has patient had a PCN reaction causing immediate rash, facial/tongue/throat swelling, SOB or lightheadedness with hypotension: Yes Has patient had a PCN reaction causing severe rash involving mucus membranes or skin necrosis: Yes Has patient had a PCN reaction that required hospitalization: No Has patient had a PCN reaction occurring within the last 10 years: yes If all of the above answers are "NO", then may proceed with Cephalosporin use.    Tessalon Perles [Benzonatate] Itching    Full head, face, neck, and chest itching.     The results of significant diagnostics from this hospitalization (including imaging, microbiology, ancillary and laboratory) are listed below for reference.    Microbiology: Recent Results (from the past 240 hour(s))  SARS CORONAVIRUS 2 (TAT 6-24 HRS) Nasopharyngeal Nasopharyngeal Swab     Status: None   Collection Time: 12/20/19  9:43 AM   Specimen: Nasopharyngeal Swab  Result Value Ref Range Status   SARS Coronavirus 2 NEGATIVE NEGATIVE Final    Comment: (NOTE) SARS-CoV-2 target nucleic acids are NOT DETECTED.  The SARS-CoV-2 RNA is generally detectable in upper and lower respiratory specimens during the acute phase of infection. Negative results do not preclude SARS-CoV-2 infection, do not rule out co-infections with other pathogens, and should not be used as the sole basis for treatment or other patient management decisions. Negative results must be combined with clinical observations, patient history, and epidemiological information. The  expected result is Negative.  Fact Sheet for Patients: SugarRoll.be  Fact Sheet for Healthcare Providers: https://www.woods-mathews.com/  This test is not yet approved or cleared by the [place] FDA and  has been authorized for detection and/or diagnosis of SARS-CoV-2 by FDA under an Emergency Use Authorization (EUA). This EUA will remain  in effect (meaning this test can be used) for the duration of the COVID-19 declaration under Se ction 564(b)(1) of the Act, 21 U.S.C. section 360bbb-3(b)(1), unless the authorization is terminated or revoked sooner.  Performed at [facility], [place] [address], [place], [place] [address]     Procedures/Studies: MR 3D Recon At Scanner  Result Date: 12/16/2019 CLINICAL DATA:  Pancreatic cancer with liver metastasis. Elevated bilirubin. EXAM: MRI ABDOMEN WITHOUT AND WITH CONTRAST (INCLUDING MRCP) TECHNIQUE: Multiplanar multisequence MR imaging of the abdomen was performed both before and after the administration of intravenous contrast. Heavily T2-weighted images of the biliary and pancreatic ducts were obtained, and three-dimensional MRCP images were rendered by post processing. CONTRAST:  32m GADAVIST GADOBUTROL 1 MMOL/ML IV SOLN COMPARISON:  Noncontrast CT 09/16/2019, 06/09/2019 FINDINGS: Lower chest: Nodule at the RIGHT lung base measuring 7 mm (image 2/series 6) is not evident on comparison CT. Hepatobiliary: While there is differing technique, there is clear enlargement of the hepatic lesions. For example lesion the superior RIGHT hepatic lobe measuring 6.6 x 4.6 cm compares to 4.9 x 3.8 cm. Lesion central lateral LEFT hepatic lobe measuring 2.8 x 2.8 cm compares to 2.1 x 2.1 cm. There is marked dilatation of the LEFT hepatic biliary tree. The a central bile ducts on the LEFT measure up to 12 mm (image 11/6). There is abrupt cut off of the dilated ducts near the level of the confluence of the LEFT and RIGHT  hepatic ducts. Mild ductal dilatation within the RIGHT ductal system. Dilatation well demonstrated on the MRCP sequences (series 11). On the most delayed contrast imaging there is a tumor at the junction of LEFT and RIGHT hepatic ducts (image 37/31) which presumably obstructs the LEFT  ductal system. The common bile duct is dilated to 9 mm distally. Pancreas: No discrete pancreatic mass identified. No pancreatic duct dilatation Spleen: Cyst within the central spleen. Adrenals/urinary tract: Adrenal glands normal. Multiple nonenhancing cysts within the kidneys. Stomach/Bowel: Stomach and limited view of the bowel is unremarkable. Vascular/Lymphatic: Abdominal aortic normal caliber. No retroperitoneal periportal lymphadenopathy. Musculoskeletal: No aggressive osseous lesion. IMPRESSION: 1. Interval enlargement of a hepatic metastasis. 2. Severe intrahepatic duct dilatation of the LEFT hepatic lobe ductal system. Obstruction appears at the level of the confluence of LEFT and RIGHT hepatic ducts. There is a liver metastasis at this level. Mild dilatation of the RIGHT ductal system. 3. No pancreatic lesion identified. 4. New nodule in the RIGHT lower lobe concerning for metastatic nodule. These results will be called to the ordering clinician or representative by the Radiologist Assistant, and communication documented in the PACS or [employer]. Electronically Signed   By: Suzy Bouchard M.D.   On: 12/16/2019 16:29   DG ERCP BILIARY & PANCREATIC DUCTS  Result Date: 12/24/2019 CLINICAL DATA:  86 year old female with a history pancreatic cancer EXAM: ERCP TECHNIQUE: Multiple spot images obtained with the fluoroscopic device and submitted for interpretation post-procedure. FLUOROSCOPY TIME:  Fluoroscopy Time:  18 minutes 16 seconds COMPARISON:  MRI 12/16/2019 FINDINGS: Limited intraoperative fluoroscopic spot images during ERCP. Initial image demonstrates endoscope projecting over the upper abdomen. Surgical  changes of cholecystectomy, as well as additional surgical clips within the mid abdomen. Subsequently there is cannulation of the ampulla and retrograde of infusion of contrast partially opacifying the intrahepatic and extrahepatic biliary ducts. Balloon angioplasty balloon appears to have been deployed. IMPRESSION: Limited images during ERCP, as above. Please refer to the dictated operative report for full details of intraoperative findings and procedure. Electronically Signed   By: Corrie Mckusick D.O.   On: 12/24/2019 09:12   MR ABDOMEN MRCP W WO CONTAST  Result Date: 12/16/2019 CLINICAL DATA:  Pancreatic cancer with liver metastasis. Elevated bilirubin. EXAM: MRI ABDOMEN WITHOUT AND WITH CONTRAST (INCLUDING MRCP) TECHNIQUE: Multiplanar multisequence MR imaging of the abdomen was performed both before and after the administration of intravenous contrast. Heavily T2-weighted images of the biliary and pancreatic ducts were obtained, and three-dimensional MRCP images were rendered by post processing. CONTRAST:  81m GADAVIST GADOBUTROL 1 MMOL/ML IV SOLN COMPARISON:  Noncontrast CT 09/16/2019, 06/09/2019 FINDINGS: Lower chest: Nodule at the RIGHT lung base measuring 7 mm (image 2/series 6) is not evident on comparison CT. Hepatobiliary: While there is differing technique, there is clear enlargement of the hepatic lesions. For example lesion the superior RIGHT hepatic lobe measuring 6.6 x 4.6 cm compares to 4.9 x 3.8 cm. Lesion central lateral LEFT hepatic lobe measuring 2.8 x 2.8 cm compares to 2.1 x 2.1 cm. There is marked dilatation of the LEFT hepatic biliary tree. The a central bile ducts on the LEFT measure up to 12 mm (image 11/6). There is abrupt cut off of the dilated ducts near the level of the confluence of the LEFT and RIGHT hepatic ducts. Mild ductal dilatation within the RIGHT ductal system. Dilatation well demonstrated on the MRCP sequences (series 11). On the most delayed contrast imaging there is a  tumor at the junction of LEFT and RIGHT hepatic ducts (image 37/31) which presumably obstructs the LEFT ductal system. The common bile duct is dilated to 9 mm distally. Pancreas: No discrete pancreatic mass identified. No pancreatic duct dilatation Spleen: Cyst within the central spleen. Adrenals/urinary tract: Adrenal glands normal. Multiple nonenhancing cysts within  the kidneys. Stomach/Bowel: Stomach and limited view of the bowel is unremarkable. Vascular/Lymphatic: Abdominal aortic normal caliber. No retroperitoneal periportal lymphadenopathy. Musculoskeletal: No aggressive osseous lesion. IMPRESSION: 1. Interval enlargement of a hepatic metastasis. 2. Severe intrahepatic duct dilatation of the LEFT hepatic lobe ductal system. Obstruction appears at the level of the confluence of LEFT and RIGHT hepatic ducts. There is a liver metastasis at this level. Mild dilatation of the RIGHT ductal system. 3. No pancreatic lesion identified. 4. New nodule in the RIGHT lower lobe concerning for metastatic nodule. These results will be called to the ordering clinician or representative by the Radiologist Assistant, and communication documented in the PACS or [employer]. Electronically Signed   By: Suzy Bouchard M.D.   On: 12/16/2019 16:29   IR PERCUTANEOUS TRANSHEPATIC CHOLANGIOGRAM  Result Date: 12/24/2019 INDICATION: Metastatic pancreatic carcinoma and biliary obstruction with inability to advance a biliary stent at the time of ERCP yesterday. EXAM: PLACEMENT OF INTERNAL/EXTERNAL PERCUTANEOUS BILIARY DRAINAGE CATHETER WITH CHOLANGIOGRAM MEDICATIONS: The patient received a scheduled dose of 500 mg IV meropenem during the procedure. ANESTHESIA/SEDATION: Moderate (conscious) sedation was employed during this procedure. A total of Versed 2.0 mg and Fentanyl 100 mcg was administered intravenously. Moderate Sedation Time: 32 minutes. The patient's level of consciousness and vital signs were monitored continuously by  radiology nursing throughout the procedure under my direct supervision. FLUOROSCOPY TIME:  Fluoroscopy Time: 6 minutes and 6 seconds. 63.0 mGy. COMPLICATIONS: None immediate. PROCEDURE: Informed written consent was obtained from the patient after a thorough discussion of the procedural risks, benefits and alternatives. All questions were addressed. Maximal Sterile Barrier Technique was utilized including caps, mask, sterile gowns, sterile gloves, sterile drape, hand hygiene and skin antiseptic. A timeout was performed prior to the initiation of the procedure. Ultrasound was used to localize the liver. The abdominal wall was prepped with chlorhexidine. Local anesthesia was provided with 1% lidocaine. Under direct ultrasound guidance, access of a right lobe bile duct was performed by puncture with a 21 gauge needle. Ultrasound image documentation was performed. After bile duct puncture, a cholangiogram was performed with injection of contrast. Fluoroscopic imaging was saved. A guidewire was advanced through the needle. A transitional dilator was placed. A 5 French catheter was advanced into the common bile duct over a guidewire. A hydrophilic guidewire was then advanced through the ampulla into the duodenum. The percutaneous tract was dilated and a 10 French internal/external biliary drainage catheter advanced over the wire. The distal portion of the catheter was formed in the duodenal lumen and the catheter position appropriately. The drain was injected with contrast material to confirm position. The catheter was secured at the skin with a Prolene retention suture and StatLock device. The drain was connected to a gravity drainage bag. FINDINGS: There was some ascites present adjacent to the inferior tip of the right lobe as well as the lateral segment of the left lobe. By ultrasound, the most accessible bile duct was a right lobe duct roughly in the mid right lobe where there was no overlying ascites. Cholangiogram  shows high-grade irregular strictures of central right lobe ducts extending to the intrahepatic biliary confluence with the left lobe. Irregular narrowing also is noted in several segments of the common hepatic and common bile ducts. The gallbladder has been removed. A pancreatic ductal stent is present. The distal common bile duct is very dilated and initially there was little to no flow of contrast through the ampulla. A hydrophilic guidewire did passed through segments of biliary stricture  and access was able to be gained across the ampulla and to the level of the duodenum. This allowed placement of an internal/external biliary drainage catheter which was formed in the duodenal lumen with sideholes extending up into the right lobe of the liver and across the biliary confluence. IMPRESSION: Percutaneous cholangiogram demonstrates irregular high-grade biliary strictures involving multiple central intrahepatic right lobe ducts, the biliary confluence, common hepatic duct and common bile duct. After right lobe access, strictures were able to be crossed allowing placement of a 10 French internal/external biliary drainage catheter that was formed in the duodenum. Sideholes extend up into right lobe ducts and the drainage catheter will be left to gravity bag drainage. Electronically Signed   By: Aletta Edouard M.D.   On: 12/24/2019 14:02     Labs: BNP (last 3 results) No results for input(s): BNP in the last 8760 hours. Basic Metabolic Panel: Recent Labs  Lab 12/22/19 0823 12/22/19 0823 12/23/19 1705 12/24/19 0322 12/25/19 0341 12/26/19 0350 12/26/19 1457  NA 136  --   --  134* 129* 129* 130*  K 4.6  --   --  4.9 5.2* 4.7 4.8  CL 100  --   --  102 97* 96* 96*  CO2 23  --   --  '22 23 25 25  ' GLUCOSE 108*  --   --  162* 153* 109* 99  BUN 32*  --   --  25* 24* 29* 30*  CREATININE 1.41*   < > 1.19* 1.15* 1.02* 0.89 0.94  CALCIUM 8.5*  --   --  7.5* 8.0* 8.2* 7.9*  MG 2.0  --   --   --   --   --   --     < > = values in this interval not displayed.   Liver Function Tests: Recent Labs  Lab 12/20/19 0900 12/22/19 0823 12/24/19 0322 12/25/19 0341 12/26/19 0350  AST 81* 97* 52* 43* 37  ALT 53* 58* 45* 45* 45*  ALKPHOS 250* 257* 216* 205* 179*  BILITOT 11.5* 12.4* 7.8* 6.7* 6.4*  PROT 7.7 7.6 6.6 6.8 6.5  ALBUMIN 3.0* 2.6* 2.2* 2.4* 2.5*   No results for input(s): LIPASE, AMYLASE in the last 168 hours. No results for input(s): AMMONIA in the last 168 hours. CBC: Recent Labs  Lab 12/20/19 0900 12/20/19 0900 12/22/19 0823 12/23/19 1705 12/24/19 0322 12/25/19 0341 12/26/19 0350  WBC 8.5   < > 9.6 6.0 5.4 9.7 9.7  NEUTROABS 6.2  --  8.1*  --  5.0  --   --   HGB 10.9*   < > 10.3* 10.5* 9.7* 10.3* 10.9*  HCT 31.4*   < > 30.2* 32.0* 28.3* 30.4* 30.9*  MCV 98.8   < > 97.7 101.3* 98.3 99.0 96.9  PLT 153.0   < > 157 142* 141* 166 156   < > = values in this interval not displayed.   Cardiac Enzymes: No results for input(s): CKTOTAL, CKMB, CKMBINDEX, TROPONINI in the last 168 hours. BNP: Invalid input(s): POCBNP CBG: No results for input(s): GLUCAP in the last 168 hours. D-Dimer No results for input(s): DDIMER in the last 72 hours. Hgb A1c No results for input(s): HGBA1C in the last 72 hours. Lipid Profile No results for input(s): CHOL, HDL, LDLCALC, TRIG, CHOLHDL, LDLDIRECT in the last 72 hours. Thyroid function studies No results for input(s): TSH, T4TOTAL, T3FREE, THYROIDAB in the last 72 hours.  Invalid input(s): FREET3 Anemia work up No results for input(s): VITAMINB12, FOLATE,  FERRITIN, TIBC, IRON, RETICCTPCT in the last 72 hours. Urinalysis    Component Value Date/Time   COLORURINE ORANGE (A) 12/08/2019 1138   APPEARANCEUR CLEAR 12/08/2019 1138   LABSPEC 1.015 12/08/2019 1138   PHURINE 7.0 12/08/2019 1138   GLUCOSEU NEGATIVE 12/08/2019 1138   HGBUR NEGATIVE 12/08/2019 1138   BILIRUBINUR MODERATE (A) 12/08/2019 1138   BILIRUBINUR negative 11/07/2019 1528    BILIRUBINUR small 05/01/2016 1425   KETONESUR NEGATIVE 12/08/2019 1138   PROTEINUR 30 (A) 12/08/2019 1138   UROBILINOGEN 1.0 11/07/2019 1528   UROBILINOGEN 0.2 10/04/2014 0920   NITRITE NEGATIVE 12/08/2019 1138   LEUKOCYTESUR NEGATIVE 12/08/2019 1138   Sepsis Labs Invalid input(s): PROCALCITONIN,  WBC,  LACTICIDVEN Microbiology Recent Results (from the past 240 hour(s))  SARS CORONAVIRUS 2 (TAT 6-24 HRS) Nasopharyngeal Nasopharyngeal Swab     Status: None   Collection Time: 12/20/19  9:43 AM   Specimen: Nasopharyngeal Swab  Result Value Ref Range Status   SARS Coronavirus 2 NEGATIVE NEGATIVE Final    Comment: (NOTE) SARS-CoV-2 target nucleic acids are NOT DETECTED.  The SARS-CoV-2 RNA is generally detectable in upper and lower respiratory specimens during the acute phase of infection. Negative results do not preclude SARS-CoV-2 infection, do not rule out co-infections with other pathogens, and should not be used as the sole basis for treatment or other patient management decisions. Negative results must be combined with clinical observations, patient history, and epidemiological information. The expected result is Negative.  Fact Sheet for Patients: SugarRoll.be  Fact Sheet for Healthcare Providers: https://www.woods-mathews.com/  This test is not yet approved or cleared by the [place] FDA and  has been authorized for detection and/or diagnosis of SARS-CoV-2 by FDA under an Emergency Use Authorization (EUA). This EUA will remain  in effect (meaning this test can be used) for the duration of the COVID-19 declaration under Se ction 564(b)(1) of the Act, 21 U.S.C. section 360bbb-3(b)(1), unless the authorization is terminated or revoked sooner.  Performed at [facility], [place] [address], [place], [place] [address]    Time coordinating discharge: 25 minutes  SIGNED: Antonieta Pert, MD  [facility] 12/26/2019, 5:26  PM  If 7PM-7AM, please contact night-coverage [email or web address]

## 2019-12-26 NOTE — Progress Notes (Signed)
Pharmacy Antibiotic Note  [name] is a 86 y.o. female admitted on 12/23/2019 with metastatic pancreatic cancer with malignant biliary obstruction. Unsuccessful ERCP attempt at biliary decompression. IR consulted and planning for PTC with biliary drain placement tomorrow. Pharmacy has been consulted for Meropenem dosing, as patient at risk for cholangitis. Noted penicillin allergy-tolerated dose of Meropenem earlier today prior to ERCP.   Plan: Meropenem 516m IV q12h  SCr improved, no change dose/schedule  Height: '5\' 3"'  (160 cm) Weight: 37.2 kg (82 lb) IBW/kg (Calculated) : 52.4  Temp (24hrs), Avg:97.6 F (36.4 C), Min:97.4 F (36.3 C), Max:97.7 F (36.5 C)  Recent Labs  Lab 12/22/19 0823 12/23/19 1705 12/24/19 0322 12/25/19 0341 12/26/19 0350  WBC 9.6 6.0 5.4 9.7 9.7  CREATININE 1.41* 1.19* 1.15* 1.02* 0.89    Estimated Creatinine Clearance: 25.2 mL/min (by C-G formula based on SCr of 0.89 mg/dL).    Allergies  Allergen Reactions  . Iohexol Hives and Shortness Of Breath    **Patient can not have IV contrast, pt has breakthrough reaction even after 13 hour premeds** Do not give IV contrast PATIENT HAD TO BE TAKEN TO ED, C/O WHELPS, HIVES, DIFFICULTY BREATHING. PATIENT GIVEN IV CONTRAST AFTER 13 HOUR PRE MEDS ON 01/09/2017 WITH NO REACTION NOTED. On 10/19/17 patient had reaction despite premedication.    .Marland KitchenAciphex [Rabeprazole Sodium] Other (See Comments)    Unknown reaction  . Amlodipine Besylate-Valsartan     Rash to high-dose (5/320), can take plain amlodipine   . Esomeprazole Magnesium     Unknown reaction  . Omeprazole Other (See Comments)    Patient states "medication didn't work"  . Ciprofloxacin Rash  . Penicillins Swelling and Rash    Has patient had a PCN reaction causing immediate rash, facial/tongue/throat swelling, SOB or lightheadedness with hypotension: Yes Has patient had a PCN reaction causing severe rash involving mucus membranes or skin necrosis:  Yes Has patient had a PCN reaction that required hospitalization: No Has patient had a PCN reaction occurring within the last 10 years: yes If all of the above answers are "NO", then may proceed with Cephalosporin use.   .[name][Benzonatate] Itching    Full head, face, neck, and chest itching.     Antimicrobials this admission: 8/13 Meropenem >>  Dose adjustments this admission:  Microbiology results: None ordered  Thank you for allowing pharmacy to be a part of this patient's care.  GMinda DittoPharmD 12/26/2019, 1:46 PM

## 2019-12-26 NOTE — Progress Notes (Addendum)
Daily Progress Note   Patient Name: [name]       Date: 12/26/2019 DOB: 30-Jan-1929  Age: 86 y.o. MRN#: [id number] Attending Physician: Antonieta Pert, MD Primary Care Physician: Fayrene Helper, MD Admit Date: 12/23/2019  Reason for Consultation/Follow-up: Establishing goals of care  Subjective: I saw and examined [name] today.  She was lying in bed no acute distress.  She denied any concerns as asked that I call her son, [name].  I called and was able to reach [place] this morning.  We discussed his mother's clinical course this admission as well as prior to hospitalization reviewed changes in nutrition, cognition and functional status as well as elevated bilirubin that has now dropped some following stent placement.  Prior to admission, it is documented that Dr. Delton Coombes had noted potential for hospice involvement moving forward.  Most recent note from GI in the hospital indicates family would like to wait to see how she improves after stent placement and then follow-up with Dr. Delton Coombes continue conversation regarding potential for further disease modifying therapy.  I talked with [name] today and he reports that the case is at this and he wants to see how his mother continues to do following drain placement.  He is hopeful because her bilirubin has dropped from 12 down to the 6 range.  He understands will likely be several weeks to see how she responds to this overall.  He would like to work on trying to get her home and then follow-up with Dr. Delton Coombes to continue conversation at follow-up in a couple of weeks.   Length of Stay: 3  Current Medications: Scheduled Meds:  . amLODipine  10 mg Oral Daily  . Chlorhexidine Gluconate Cloth  6 each Topical Daily  . clonazePAM   0.5 mg Oral Daily  . cycloSPORINE  1 drop Both Eyes BID  . enoxaparin (LOVENOX) injection  30 mg Subcutaneous Q24H  . methylPREDNISolone  32 mg Oral Daily  . metoprolol tartrate  25 mg Oral Daily  . mirabegron ER  25 mg Oral Daily  . sodium chloride flush  5 mL Intracatheter Q8H  . sodium chloride  2 g Oral Once  . traMADol  50 mg Oral Q12H    Continuous Infusions: . sodium chloride 20 mL/hr at 12/26/19 0619  . lactated ringers Stopped (  12/26/19 0300)  . meropenem (MERREM) IV 500 mg (12/25/19 2220)    PRN Meds: morphine injection, ondansetron **OR** ondansetron (ZOFRAN) IV, polyethylene glycol, sodium chloride flush  Physical Exam       General: Alert, awake, in no acute distress.  Thin. HEENT: No bruits, no goiter, no JVD Heart: Regular rate and rhythm. No murmur appreciated. Lungs: Good air movement, clear Abdomen: Soft, nontender, nondistended, positive bowel sounds. Percutaneous drain in place. Ext: No significant edema Skin: Warm and dry Neuro: Grossly intact, nonfocal.    Vital Signs: BP (!) 149/78   Pulse 61   Temp (!) 97.5 F (36.4 C)   Resp 18   Ht 5\' 3"  (1.6 m)   Wt 37.2 kg   SpO2 100%   BMI 14.53 kg/m  SpO2: SpO2: 100 % O2 Device: O2 Device: Room Air O2 Flow Rate: O2 Flow Rate (L/min): 2 L/min  Intake/output summary:   Intake/Output Summary (Last 24 hours) at 12/26/2019 0945 Last data filed at 12/26/2019 0700 Gross per 24 hour  Intake 960.06 ml  Output 525 ml  Net 435.06 ml   LBM:   Baseline Weight: Weight: 41.7 kg Most recent weight: Weight: 37.2 kg       Palliative Assessment/Data:      Patient Active Problem List   Diagnosis Date Noted  . Palliative care encounter 12/23/2019  . Biliary obstruction due to cancer ([place]) 12/23/2019  . Pancreatic cancer ([place]) 12/23/2019  . Underweight due to inadequate caloric intake 11/07/2019  . Abdominal pain 06/21/2019  . Genetic testing 12/28/2018  . Family history of prostate cancer   . Family  history of breast cancer in female   . Goals of care, counseling/discussion 09/22/2018  . Urinary incontinence 04/04/2018  . Insomnia due to anxiety and fear 01/11/2018  . Nicotine dependence 01/11/2018  . Bilateral nephrolithiasis 08/14/2017  . Thrombocytopenia ([place]) 01/10/2017  . Neuropathy due to chemotherapeutic drug ([place]) 01/10/2017  . At high risk for falls 09/22/2015  . Anemia due to antineoplastic chemotherapy 09/12/2015  . DNR (do not resuscitate) 08/16/2015  . Chronic low back pain with sciatica 06/03/2015  . Hx of adenomatous colonic polyps 04/24/2015  . Right knee pain 04/14/2015  . Allergic rhinitis 12/12/2014  . Pancreatic cancer metastasized to liver ([place]) 03/10/2014  . Anemia, iron deficiency 11/03/2013  . Annual physical exam 10/16/2013  . Primary generalized (osteo)arthritis 05/12/2012  . Anxiety 09/15/2011  . GERD (gastroesophageal reflux disease) 08/20/2010  . Essential hypertension 01/07/2010    Palliative Care Assessment & Plan   Patient Profile: 86 year old female with past medical history for pancreatic cancer with biliary obstruction who is now status post percutaneous biliary drain placement.  Assessment: Patient Active Problem List   Diagnosis Date Noted  . Palliative care encounter 12/23/2019  . Biliary obstruction due to cancer ([place]) 12/23/2019  . Pancreatic cancer ([place]) 12/23/2019  . Underweight due to inadequate caloric intake 11/07/2019  . Abdominal pain 06/21/2019  . Genetic testing 12/28/2018  . Family history of prostate cancer   . Family history of breast cancer in female   . Goals of care, counseling/discussion 09/22/2018  . Urinary incontinence 04/04/2018  . Insomnia due to anxiety and fear 01/11/2018  . Nicotine dependence 01/11/2018  . Bilateral nephrolithiasis 08/14/2017  . Thrombocytopenia ([place]) 01/10/2017  . Neuropathy due to chemotherapeutic drug ([place]) 01/10/2017  . At high risk for falls 09/22/2015  . Anemia due to  antineoplastic chemotherapy 09/12/2015  . DNR (do not resuscitate) 08/16/2015  . Chronic low  back pain with sciatica 06/03/2015  . Hx of adenomatous colonic polyps 04/24/2015  . Right knee pain 04/14/2015  . Allergic rhinitis 12/12/2014  . Pancreatic cancer metastasized to liver ([place]) 03/10/2014  . Anemia, iron deficiency 11/03/2013  . Annual physical exam 10/16/2013  . Primary generalized (osteo)arthritis 05/12/2012  . Anxiety 09/15/2011  . GERD (gastroesophageal reflux disease) 08/20/2010  . Essential hypertension 01/07/2010    Recommendations/Plan: Goals of care: I spoke this morning with patient's son, [name].  He reports understanding that this not a curable condition but he would like to wait to see how she does over the next couple of weeks following percutaneous biliary drain placement prior to making any decision about long-term goals of care.  We discussed hospice services and, while family is not opposed, they want to speak further with Dr. Delton Coombes prior to making decisions about next steps.  She has a completed MOST form on file with indication for DNR, limited additional interventions, antibiotics if indicated, IV fluids if indicated, and no feeding tube.  This continues to be in line with patient's wishes.  Recommend continued follow-up with Dr. Delton Coombes continue conversation regarding goals of care once it is determined how she responds to biliary drain placement.   Code Status:    Code Status Orders  (From admission, onward)         Start     Ordered   12/23/19 1558  Do not attempt resuscitation (DNR)  Continuous       Question Answer Comment  In the event of cardiac or respiratory ARREST Do not call a "code blue"   In the event of cardiac or respiratory ARREST Do not perform Intubation, CPR, defibrillation or ACLS   In the event of cardiac or respiratory ARREST Use medication by any route, position, wound care, and other measures to relive pain and suffering. May  use oxygen, suction and manual treatment of airway obstruction as needed for comfort.      12/23/19 1557        Code Status History    Date Active Date Inactive Code Status Order ID Comments User Context   10/29/2016 1814 10/19/2017 1657 DNR [id number]  Holley Bouche, NP Outpatient   10/05/2016 2352 10/07/2016 1404 DNR [id number]  Orvan Falconer, MD Inpatient   09/16/2016 0929 10/05/2016 1903 DNR [id number]  Holley Bouche, NP Outpatient   01/03/2016 1829 01/23/2016 0846 DNR [id number]  Patrici Ranks, MD Outpatient   08/15/2015 1028 12/21/2015 0955 DNR [id number]  Baird Cancer, PA-C Outpatient   03/15/2014 1539 03/16/2014 0335 Full Code [id number]  Sandi Mariscal, MD HOV   03/09/2014 1458 03/10/2014 0330 Full Code [id number]  Jacqulynn Cadet, MD [facility]   Advance Care Planning Activity    Advance Directive Documentation     Most Recent Value  Type of Advance Directive Healthcare Power of Attorney, Living will  Pre-existing out of facility DNR order (yellow form or pink MOST form) --  "MOST" Form in Place? --      Prognosis:  Unable to determine  Discharge Planning: Home with [place] was discussed with patient, son  Thank you for allowing the Palliative Medicine Team to assist in the care of this patient.   Time In: 0850 Time Out: 0930 Total Time 40 Prolonged Time Billed No      Greater than 50%  of this time was spent counseling and coordinating care related to the above assessment and plan.  Micheline Rough, MD  Please  contact Palliative Medicine Team phone at [phone] for questions and concerns.

## 2019-12-26 NOTE — Progress Notes (Signed)
Physical Therapy Treatment Patient Details Name: [name] MRN: [id number] DOB: Sep 25, 1928 Today's Date: 12/26/2019    History of Present Illness 86 y.o. female past medical history of metastatic pancreatic cancer with metastases to the liver, anemia due to antineoplastic chemotherapy on Aranesp, last chemo on 09/22/2018 who was admitted to [facility] endoscopy suite for an ERCP for attempt biliary decompression in the setting of metastatic pancreatic cancer. S/p Internal/External biliary drainage catheter placement with cholangiogram 8/14.    PT Comments    Pt requiring incr assist overall today, mod/max assist to stand, unable to amb. Pt very sleepy/lethargic, arouses but has difficulty keeping eyes open. Supportive son present   Follow Up Recommendations  Home health PT;Supervision for mobility/OOB     Equipment Recommendations  Other (comment) (TBD)    Recommendations for Other Services       Precautions / Restrictions Precautions Precautions: Fall    Mobility  Bed Mobility Overal bed mobility: Needs Assistance Bed Mobility: Supine to Sit;Sit to Supine     Supine to sit: Min assist Sit to supine: Min assist   General bed mobility comments: assist to elevate trunk, min/guard to control trunk desecent   Transfers Overall transfer level: Needs assistance Equipment used: Rolling walker (2 wheeled);2 person hand held assist Transfers: Sit to/from Stand Sit to Stand: Mod assist;Max assist         General transfer comment: repeated x3, pt with significant posterior bias and lifting RLE off floor and/or extending RLE forward  Ambulation/Gait                 Stairs             Wheelchair Mobility    Modified Rankin (Stroke Patients Only)       Balance     Sitting balance-Leahy Scale: Fair     Standing balance support: Single extremity supported Standing balance-Leahy Scale: Poor                              Cognition  Arousal/Alertness: Lethargic (arouses but very sleepy, likely d/t meds-?) Behavior During Therapy: WFL for tasks assessed/performed Overall Cognitive Status: Within Functional Limits for tasks assessed                                        Exercises      General Comments        Pertinent Vitals/Pain Pain Assessment: No/denies pain    Home Living                      Prior Function            PT Goals (current goals can now be found in the care plan section) Acute Rehab PT Goals Patient Stated Goal: play cards and visit with family PT Goal Formulation: With patient Time For Goal Achievement: 01/08/20 Potential to Achieve Goals: Good Progress towards PT goals: Progressing toward goals    Frequency    Min 3X/week      PT Plan Current plan remains appropriate    Co-evaluation              AM-PAC PT "6 Clicks" Mobility   Outcome Measure  Help needed turning from your back to your side while in a flat bed without using bedrails?: A Little Help needed moving from lying  on your back to sitting on the side of a flat bed without using bedrails?: A Little Help needed moving to and from a bed to a chair (including a wheelchair)?: A Lot Help needed standing up from a chair using your arms (e.g., wheelchair or bedside chair)?: A Lot Help needed to walk in hospital room?: Total Help needed climbing 3-5 steps with a railing? : Total 6 Click Score: 12    End of Session Equipment Utilized During Treatment: Gait belt Activity Tolerance: Patient tolerated treatment well Patient left: with call bell/phone within reach;in bed;with family/visitor present Nurse Communication: Mobility status PT Visit Diagnosis: Difficulty in walking, not elsewhere classified (R26.2);Unsteadiness on feet (R26.81)     Time: 0623-7628 PT Time Calculation (min) (ACUTE ONLY): 24 min  Charges:  $Therapeutic Activity: 23-37 mins                     Baxter Flattery,  PT  Acute Rehab Dept ([facility]) [phone] Pager [phone]  12/26/2019    [facility] 12/26/2019, 1:22 PM

## 2019-12-26 NOTE — TOC Initial Note (Signed)
Transition of Care [facility]) - Initial/Assessment Note    Patient Details  Name: [name] MRN: [id number] Date of Birth: 11/08/1928  Transition [facility] CM/SW Contact:    Trish Mage, LCSW Phone Number: 12/26/2019, 10:46 AM  Clinical Narrative:  Met with patient in follow up to PT recommendation of [facility].  Met with Ms Maurer to go over recommendation, who deferred to son and asked that I speak with him when he arrives this AM.  Met with Mr Maizy Davanzo and patient together.  Son states patient uses can at home as needed, and he has access to [facility] if needed.  He is full time caregiver for patient, and given her current medical status, he is not convinced that [facility] PT is a high priority.  Declined at this time.  I let him know if he has further questions or changes his mind, he should let me know.  Also let him know that he can access services or equipment through PCP once she leaves the hospital, if need be.  He verbalized understanding. TOC will continue to follow during the course of hospitalization.                  Expected Discharge Plan: Home/Self Care Barriers to Discharge: No Barriers Identified   Patient Goals and CMS Choice        Expected Discharge Plan and Services Expected Discharge Plan: Home/Self Care   Discharge Planning Services: CM Consult   Living arrangements for the past 2 months: Single Family Home                                      Prior Living Arrangements/Services Living arrangements for the past 2 months: Single Family Home Lives with:: Adult Children Patient language and need for interpreter reviewed:: Yes Do you feel safe going back to the place where you live?: Yes      Need for Family Participation in Patient Care: Yes (Comment) Care giver support system in place?: Yes (comment)   Criminal Activity/Legal Involvement Pertinent to Current Situation/Hospitalization: No - Comment as needed  Activities of Daily Living Home  Assistive Devices/Equipment: Cane (specify quad or straight), Dentures (specify type), Eyeglasses (partial upper and lower) ADL Screening (condition at time of admission) Patient's cognitive ability adequate to safely complete daily activities?: Yes Is the patient deaf or have difficulty hearing?: No Does the patient have difficulty seeing, even when wearing glasses/contacts?: Yes Does the patient have difficulty concentrating, remembering, or making decisions?: No Patient able to express need for assistance with ADLs?: Yes Does the patient have difficulty dressing or bathing?: Yes Independently performs ADLs?: No Communication: Independent Dressing (OT): Needs assistance Is this a change from baseline?: Pre-admission baseline Grooming: Independent Feeding: Independent Bathing: Needs assistance Is this a change from baseline?: Pre-admission baseline Toileting: Needs assistance Is this a change from baseline?: Pre-admission baseline In/Out Bed: Needs assistance Is this a change from baseline?: Pre-admission baseline Walks in Home: Needs assistance Is this a change from baseline?: Pre-admission baseline Does the patient have difficulty walking or climbing stairs?: Yes Weakness of Legs: Both Weakness of Arms/Hands: Both  Permission Sought/Granted Permission sought to share information with : Family Supports Permission granted to share information with : Yes, Verbal Permission Granted  Share Information with NAME: Jasmine Mcbeth-son           Emotional Assessment Appearance:: Appears stated age Attitude/Demeanor/Rapport: Engaged Affect (  typically observed): Appropriate Orientation: : Oriented to Self, Oriented to Place, Oriented to Situation Alcohol / Substance Use: Not Applicable Psych Involvement: No (comment)  Admission diagnosis:  Pancreatic cancer [facility]) [C25.9] Patient Active Problem List   Diagnosis Date Noted  . Palliative care encounter 12/23/2019  . Biliary obstruction due  to cancer ([place]) 12/23/2019  . Pancreatic cancer ([place]) 12/23/2019  . Underweight due to inadequate caloric intake 11/07/2019  . Abdominal pain 06/21/2019  . Genetic testing 12/28/2018  . Family history of prostate cancer   . Family history of breast cancer in female   . Goals of care, counseling/discussion 09/22/2018  . Urinary incontinence 04/04/2018  . Insomnia due to anxiety and fear 01/11/2018  . Nicotine dependence 01/11/2018  . Bilateral nephrolithiasis 08/14/2017  . Thrombocytopenia ([place]) 01/10/2017  . Neuropathy due to chemotherapeutic drug ([place]) 01/10/2017  . At high risk for falls 09/22/2015  . Anemia due to antineoplastic chemotherapy 09/12/2015  . DNR (do not resuscitate) 08/16/2015  . Chronic low back pain with sciatica 06/03/2015  . Hx of adenomatous colonic polyps 04/24/2015  . Right knee pain 04/14/2015  . Allergic rhinitis 12/12/2014  . Pancreatic cancer metastasized to liver ([place]) 03/10/2014  . Anemia, iron deficiency 11/03/2013  . Annual physical exam 10/16/2013  . Primary generalized (osteo)arthritis 05/12/2012  . Anxiety 09/15/2011  . GERD (gastroesophageal reflux disease) 08/20/2010  . Essential hypertension 01/07/2010   PCP:  Fayrene Helper, MD Pharmacy:   [place], [place] [address] AT [place] [place] [address] Phone: [phone] Fax: [phone]  [facility] [place], [place] [address] [place] [address] Phone: [phone] Fax: [place], [place] - [address] [place] [address] [place] [address] Phone: [phone] Fax: [phone]     Social Determinants of Health ([place]) Interventions    Readmission Risk Interventions No flowsheet data found.

## 2019-12-27 NOTE — Consult Note (Signed)
Palliative care consult note  Reason for consult: Goals of care in light of advanced pancreatic cancer  Palliative care consult received.  Chart reviewed including personal review of pertinent labs and imaging.  Briefly, Ms. Ewy is an 86 year old female with past medical history significant for metastatic pancreatic cancer with biliary obstruction, GERD, colon polyps, nephrolithiasis, hiatal hernia, Schatzki's ring who is admitted to the hospital post ERCP attempt with unsuccessful biliary decompression who is now status post percutaneous biliary drainage tube.  She follows with Dr. Delton Coombes as an outpatient for oncology care.  Prior to admission, there is mention of hospice in her chart due to the fact she continues to have decreased functional status and increased total bilirubin.  Now that she is status post biliary tube drainage, there is note of plan by GI to follow up with Dr. Delton Coombes in a couple of weeks to see if she may be a candidate for further disease modifying therapy.  Palliative consult for goals of care.  I saw and examined Ms. Defrain today.  She is a very friendly elderly appearing female who is lying in bed in no acute distress.  I introduced palliative care as specialized medical care for people living with serious illness. It focuses on providing relief from the symptoms and stress of a serious illness. The goal is to improve quality of life for both the patient and the family.  She tells me the family and faith the most important things to her.  She relies upon her son to help her with day-to-day activities and states they are very close.  Ms. Dray tells me her doctors have been doing a job explaining things to her, but she relies heavily upon her son to help process medical information.  She states that he is the one who helps her determine next steps regarding care plan and asked for a call him to discuss her case further.  She did deny having any pain or shortness of  breath and did not have any other concerns or complaints this evening.  I attempted to call her son, Barbaraann Rondo, was able to reach him this evening.  I left a voicemail requesting return call.  Recommendations: -DNR/DNI -Ms. Lansdale follows with Dr. Delton Coombes as an outpatient.  She is admitted for ERCP which was unsuccessful and therefore percutaneous biliary drain placed.  She tells me that she does not know what the next steps are and asks me to call her son to discuss further. -Call placed to message left for son Saphyre Cillo.  Await return call and will plan for family meeting.  Total time: 45 minutes  Greater than 50%  of this time was spent counseling and coordinating care related to the above assessment and plan.  Micheline Rough, MD [place] Team [phone]

## 2020-01-02 ENCOUNTER — Telehealth: Payer: Self-pay

## 2020-01-02 ENCOUNTER — Inpatient Hospital Stay (HOSPITAL_COMMUNITY)

## 2020-01-02 ENCOUNTER — Telehealth (HOSPITAL_COMMUNITY): Payer: Self-pay | Admitting: Surgery

## 2020-01-02 ENCOUNTER — Encounter (HOSPITAL_COMMUNITY): Payer: Self-pay | Admitting: Hematology

## 2020-01-02 ENCOUNTER — Other Ambulatory Visit: Payer: Self-pay

## 2020-01-02 ENCOUNTER — Inpatient Hospital Stay (HOSPITAL_BASED_OUTPATIENT_CLINIC_OR_DEPARTMENT_OTHER): Admitting: Hematology

## 2020-01-02 VITALS — BP 98/54 | HR 62 | Temp 98.9°F | Resp 16 | Wt 80.0 lb

## 2020-01-02 DIAGNOSIS — C787 Secondary malignant neoplasm of liver and intrahepatic bile duct: Secondary | ICD-10-CM

## 2020-01-02 DIAGNOSIS — C259 Malignant neoplasm of pancreas, unspecified: Secondary | ICD-10-CM

## 2020-01-02 DIAGNOSIS — R634 Abnormal weight loss: Secondary | ICD-10-CM | POA: Diagnosis not present

## 2020-01-02 DIAGNOSIS — C252 Malignant neoplasm of tail of pancreas: Secondary | ICD-10-CM | POA: Diagnosis present

## 2020-01-02 LAB — HEPATIC FUNCTION PANEL
ALT: 32 U/L (ref 0–44)
AST: 32 U/L (ref 15–41)
Albumin: 2.4 g/dL — ABNORMAL LOW (ref 3.5–5.0)
Alkaline Phosphatase: 118 U/L (ref 38–126)
Bilirubin, Direct: 3.7 mg/dL — ABNORMAL HIGH (ref 0.0–0.2)
Indirect Bilirubin: 3.3 mg/dL — ABNORMAL HIGH (ref 0.3–0.9)
Total Bilirubin: 7 mg/dL — ABNORMAL HIGH (ref 0.3–1.2)
Total Protein: 6.2 g/dL — ABNORMAL LOW (ref 6.5–8.1)

## 2020-01-02 MED ORDER — TRAMADOL HCL 50 MG PO TABS
ORAL_TABLET | ORAL | Status: AC
  Filled 2020-01-02: qty 1

## 2020-01-02 MED ORDER — SODIUM CHLORIDE 0.9% FLUSH
10.0000 mL | INTRAVENOUS | Status: DC | PRN
  Administered 2020-01-02: 10 mL via INTRAVENOUS

## 2020-01-02 MED ORDER — HEPARIN SOD (PORK) LOCK FLUSH 100 UNIT/ML IV SOLN
500.0000 [IU] | Freq: Once | INTRAVENOUS | Status: AC
  Administered 2020-01-02: 500 [IU] via INTRAVENOUS

## 2020-01-02 MED ORDER — TRAMADOL HCL 50 MG PO TABS
50.0000 mg | ORAL_TABLET | Freq: Once | ORAL | Status: AC
  Administered 2020-01-02: 50 mg via ORAL

## 2020-01-02 MED ORDER — SODIUM CHLORIDE 0.9 % IV SOLN: INTRAVENOUS | Status: AC

## 2020-01-02 NOTE — Progress Notes (Signed)
Patient seen today in clinic by Dr. Delton Coombes. He wants to check patient's liver functions today and give normal saline 1 liter over 2 hours.  He talked to patient and they have decided to move forward with hospice care at this time.  Primary RN and pharmacy aware.

## 2020-01-02 NOTE — Telephone Encounter (Signed)
Pt's son had called asking if his mother was still supposed to have BMs after having a drainage tube placed.   After reviewing the notes from the [place] who performed the ERCP, a biliary drain was placed after the ERCP failed.  I notified the pt's son that she would still have BMs with the biliary drain, and he verbalized understanding.

## 2020-01-02 NOTE — Progress Notes (Signed)
1100 Pt seen by Dr. Delton Coombes today and pt to receive 1 liter of NS over 2 hours today with hepatic panel drawn prior to infusion starting per MD orders

## 2020-01-02 NOTE — Progress Notes (Signed)
[facility] [address], [place] [address]   CLINIC:  Medical Oncology/Hematology  PCP:  Kerri Perches, MD [address] / [place] [address] [phone]   REASON FOR VISIT:  Follow-up for metastatic pancreatic cancer to the liver  PRIOR THERAPY:  1. Gemcitabine and Abraxane from 03/2014 to 12/2016. 2. Gemcitabine and Tarceva from 01/28/2017 to 10/07/2017. 3. Infusional 5-FU and Onivyde x 21 cycles from 10/21/2017 to 09/08/2018. 4. FOLFOX x 7 cycles from 09/22/2018 to 12/29/2018. 5. Cisplatin, gemcitabine and Aloxi from 02/10/2019 to 10/27/2019.  NGS Results: Foundation 1 MS--stable, TMB cannot be determined, K-RAS G 12 V, T p53 mutation  CURRENT THERAPY: Observation  BRIEF ONCOLOGIC HISTORY:  Oncology History  Pancreatic cancer metastasized to liver (HCC)  03/10/2014 Initial Diagnosis   Pancreatic cancer metastasized to liver   03/22/2014 - 03/05/2016 Chemotherapy   Abraxane/Gemzar days 1, 8, every 28 days.  Day 15 was cancelled due to leukopenia and thrombocytopenia on day 15 cycle 1.   05/24/2014 Treatment Plan Change   Day 8 of cycle 3 is held with ANC of 1.1   06/05/2014 Imaging   CT C/A/P . Interval decrease in size of the pancreatic tail mass.Improved hepatic metastatic disease. No new lesions. No CT findings for metastatic disease involving the chest.   08/09/2014 Tumor Marker   CA 19-9= 33 (WNL)   10/26/2014 Imaging   MRI- Continued interval decrease in size of the hepatic metastatic lesions and no new lesions are identified. Continued decrease in size of the pancreatic tail lesion.   01/03/2015 Tumor Marker   Results for ARELYS, GLASSCO (MRN [id number]) as of 01/18/2015 12:52  01/03/2015 10:00 CA 19-9: 14    01/17/2015 Imaging   MRI- Response to therapy of hepatic metastasis.  Similar size of a pancreatic tail lesion.   03/20/2015 Imaging   MRI-L spine- Severe disc space narrowing at L2-L3, with endplate reactive  changes. Large disc extrusion into the ventral epidural space,central to the RIGHT with a cephalad migrated free fragment. Additional Large disc extrusion into the retroperitone...   03/22/2015 Imaging   CT pelvis- No evidence for metastatic disease within the pelvis.   07/24/2015 Imaging   MRI abd- Continued response to therapy, with no residual detectable liver metastases. No new sites of metastatic disease in the abdomen.    08/15/2015 Code Status    She confirms desire for DNR status.   12/21/2015 Imaging   MRI liver- Severe image degradation due to motion artifact reducing diagnostic sensitivity and specificity. Reduce conspicuity of the pancreatic tail lesion suggesting further improvement. The original liver lesions have resolved.   03/05/2016 Treatment Plan Change   Chemotherapy holiday/Break after 2 years worth of treatment   04/07/2016 Imaging   CT chest- When compared to recent chest CT, new minimally displaced anterior left sixth rib fracture. Slight increase in subcarinal adenopathy   04/28/2016 Imaging   Bone density- BMD as determined from Femur Neck Right is 0.705 g/cm2 with a T-Score of -2.4. This patient is considered osteopenic according to World Health Organization [facility]) criteria. Compared with the prior study on 02/23/2013, the BMD of the lumbar spine/rt. femoral neck show a statistically significant decrease.    05/23/2016 Imaging   MRI abd- 1. Exam is significantly degraded by patient respiratory motion. Consider follow-up exams with CT abdomen with without contrast per pancreatic protocol. 2. Fullness in tail of pancreas is more prominent and potentially increased in size. Recommend close attention on follow-up. (  Consider CT as above) 3. No explanation for back pain.   09/11/2016 Imaging   MRI pancreas- Interval progression of pancreatic tail lesion. New differential perfusion right liver with areas of heterogeneity. Underlying metastatic disease in this  region not excluded.   09/11/2016 Progression   MRi in conjunction with rising CA 19-9 are indicative of relapse of disease.   01/09/2017 Progression   CT C/A/P: 2.1 x 2.9 cm lesion in the pancreatic tail and abutting the splenic hilum, corresponding to known primary pancreatic neoplasm, mildly increased.  Scattered small hypoenhancing lesions in the liver measuring up to 10 mm, approximately 8-10 in number, suspicious for hepatic metastases.  No findings specific for metastatic disease in the chest.  Additional ancillary findings as above.    01/27/2017 - 10/20/2017 Chemotherapy   The patient had gemcitabine (GEMZAR) 1,292 mg in sodium chloride 0.9 % 250 mL chemo infusion, 800 mg/m2 = 1,292 mg, Intravenous,  Once, 9 of 12 cycles Administration: 1,292 mg (01/28/2017), 1,292 mg (02/11/2017), 1,292 mg (02/25/2017), 1,292 mg (03/11/2017), 1,292 mg (03/25/2017), 1,292 mg (04/08/2017), 1,292 mg (04/22/2017), 1,292 mg (04/29/2017), 1,292 mg (05/20/2017), 1,292 mg (06/03/2017), 1,292 mg (06/24/2017), 1,292 mg (07/08/2017), 1,292 mg (07/29/2017), 1,292 mg (08/12/2017), 1,292 mg (08/26/2017), 1,292 mg (09/09/2017), 1,292 mg (09/23/2017), 1,292 mg (10/07/2017)  for chemotherapy treatment.    10/21/2017 - 09/22/2018 Chemotherapy   The patient had leucovorin 700 mg in dextrose 5 % 250 mL infusion, 644 mg, Intravenous,  Once, 22 of 22 cycles Administration: 700 mg (10/21/2017), 700 mg (11/04/2017), 700 mg (11/18/2017), 700 mg (12/02/2017), 700 mg (12/16/2017), 700 mg (12/30/2017), 700 mg (01/13/2018), 700 mg (01/27/2018), 700 mg (02/10/2018), 700 mg (02/24/2018), 700 mg (03/10/2018), 700 mg (03/24/2018), 700 mg (04/28/2018), 700 mg (05/17/2018), 700 mg (05/31/2018), 700 mg (06/16/2018), 700 mg (06/30/2018), 700 mg (07/21/2018), 700 mg (08/04/2018), 700 mg (08/25/2018), 700 mg (09/08/2018) ondansetron (ZOFRAN) 8 mg in sodium chloride 0.9 % 50 mL IVPB, , Intravenous,  Once, 21 of 21 cycles Administration:  (11/04/2017),  (01/27/2018),   (02/10/2018),  (02/24/2018),  (03/10/2018),  (03/24/2018),  (04/28/2018),  (05/17/2018),  (05/31/2018),  (06/16/2018),  (06/30/2018),  (07/21/2018),  (08/04/2018),  (08/25/2018),  (09/08/2018) fluorouracil (ADRUCIL) 3,850 mg in sodium chloride 0.9 % 73 mL chemo infusion, 2,400 mg/m2 = 3,850 mg, Intravenous, 1 Day/Dose, 22 of 22 cycles Dose modification: 1,920 mg/m2 (original dose 2,400 mg/m2, Cycle 2, Reason: Provider Judgment, Comment: mucisitis) Administration: 3,850 mg (10/21/2017), 3,100 mg (11/04/2017), 3,100 mg (11/18/2017), 3,100 mg (12/02/2017), 3,100 mg (12/16/2017), 3,100 mg (12/30/2017), 3,100 mg (01/13/2018), 3,100 mg (01/27/2018), 3,100 mg (02/10/2018), 3,100 mg (02/24/2018), 3,100 mg (03/10/2018), 3,100 mg (03/24/2018), 3,100 mg (04/28/2018), 3,100 mg (05/17/2018), 3,100 mg (05/31/2018), 3,100 mg (06/16/2018), 3,100 mg (06/30/2018), 3,100 mg (07/21/2018), 3,100 mg (08/04/2018), 3,100 mg (08/25/2018), 3,100 mg (09/08/2018) irinotecan LIPOSOME (ONIVYDE) 81.7 mg in sodium chloride 0.9 % 500 mL chemo infusion, 50 mg/m2 = 81.7 mg (100 % of original dose 50 mg/m2), Intravenous, Once, 22 of 22 cycles Dose modification: 50 mg/m2 (original dose 50 mg/m2, Cycle 1, Reason: Provider Judgment), 50 mg/m2 (original dose 50 mg/m2, Cycle 2, Reason: Provider Judgment) Administration: 81.7 mg (10/21/2017), 81.7 mg (11/04/2017), 81.7 mg (11/18/2017), 81.7 mg (12/02/2017), 81.7 mg (12/16/2017), 81.7 mg (12/30/2017), 81.7 mg (01/13/2018), 81.7 mg (01/27/2018), 81.7 mg (02/10/2018), 81.7 mg (02/24/2018), 81.7 mg (03/10/2018), 81.7 mg (03/24/2018), 81.7 mg (04/28/2018), 81.7 mg (05/17/2018), 81.7 mg (05/31/2018), 81.7 mg (06/16/2018), 81.7 mg (06/30/2018), 81.7 mg (07/21/2018), 81.7 mg (08/04/2018), 81.7 mg (08/25/2018), 81.7 mg (09/08/2018)  for chemotherapy treatment.  09/22/2018 - 01/11/2019 Chemotherapy   The patient had palonosetron (ALOXI) injection 0.25 mg, 0.25 mg, Intravenous,  Once, 7 of 8 cycles Administration: 0.25 mg (09/22/2018), 0.25 mg (10/11/2018), 0.25  mg (10/25/2018), 0.25 mg (12/01/2018), 0.25 mg (12/15/2018), 0.25 mg (11/17/2018), 0.25 mg (12/29/2018) pegfilgrastim (NEULASTA) injection 6 mg, 6 mg, Subcutaneous, Once, 5 of 5 cycles Administration: 6 mg (10/13/2018), 6 mg (10/27/2018), 6 mg (12/03/2018), 6 mg (12/17/2018), 6 mg (11/19/2018) pegfilgrastim-cbqv (UDENYCA) injection 6 mg, 6 mg, Subcutaneous, Once, 1 of 2 cycles Administration: 6 mg (12/31/2018) leucovorin 628 mg in dextrose 5 % 250 mL infusion, 400 mg/m2 = 628 mg, Intravenous,  Once, 7 of 8 cycles Administration: 628 mg (09/22/2018), 628 mg (10/11/2018), 628 mg (10/25/2018), 628 mg (12/01/2018), 628 mg (12/15/2018), 628 mg (11/17/2018), 628 mg (12/29/2018) oxaliplatin (ELOXATIN) 100 mg in dextrose 5 % 500 mL chemo infusion, 65 mg/m2 = 100 mg (100 % of original dose 65 mg/m2), Intravenous,  Once, 7 of 8 cycles Dose modification: 65 mg/m2 (original dose 65 mg/m2, Cycle 1, Reason: Provider Judgment) Administration: 100 mg (09/22/2018), 100 mg (10/11/2018), 100 mg (10/25/2018), 100 mg (12/01/2018), 100 mg (12/15/2018), 100 mg (11/17/2018), 100 mg (12/29/2018) fluorouracil (ADRUCIL) chemo injection 500 mg, 320 mg/m2 = 500 mg (100 % of original dose 320 mg/m2), Intravenous,  Once, 7 of 8 cycles Dose modification: 320 mg/m2 (original dose 320 mg/m2, Cycle 1, Reason: Provider Judgment) Administration: 500 mg (09/22/2018), 500 mg (10/11/2018), 500 mg (10/25/2018), 500 mg (12/01/2018), 500 mg (12/15/2018), 500 mg (11/17/2018), 500 mg (12/29/2018) fluorouracil (ADRUCIL) 3,000 mg in sodium chloride 0.9 % 90 mL chemo infusion, 1,920 mg/m2 = 3,000 mg (100 % of original dose 1,920 mg/m2), Intravenous, 1 Day/Dose, 7 of 8 cycles Dose modification: 1,920 mg/m2 (original dose 1,920 mg/m2, Cycle 1, Reason: Provider Judgment) Administration: 3,000 mg (09/22/2018), 3,000 mg (10/11/2018), 3,000 mg (10/25/2018), 3,000 mg (12/01/2018), 3,000 mg (12/15/2018), 3,000 mg (11/17/2018), 3,000 mg (12/29/2018)  for chemotherapy treatment.    12/24/2018 Genetic Testing     Negative genetic testing on the common hereditary cancer panel.  The Common Hereditary Gene Panel offered by [facility] includes sequencing and/or deletion duplication testing of the following 48 genes: APC, ATM, AXIN2, BARD1, BMPR1A, BRCA1, BRCA2, BRIP1, CDH1, CDK4, CDKN2A (p14ARF), CDKN2A (p16INK4a), CHEK2, CTNNA1, DICER1, EPCAM (Deletion/duplication testing only), GREM1 (promoter region deletion/duplication testing only), KIT, MEN1, MLH1, MSH2, MSH3, MSH6, MUTYH, NBN, NF1, NHTL1, PALB2, PDGFRA, PMS2, POLD1, POLE, PTEN, RAD50, RAD51C, RAD51D, RNF43, SDHB, SDHC, SDHD, SMAD4, SMARCA4. STK11, TP53, TSC1, TSC2, and VHL.  The following genes were evaluated for sequence changes only: SDHA and HOXB13 c.251G>A variant only. The report date is December 24, 2018.  DICER1 VUS identified.  This will not change medical decision making.   02/10/2019 -  Chemotherapy   The patient had palonosetron (ALOXI) injection 0.25 mg, 0.25 mg, Intravenous,  Once, 11 of 11 cycles Administration: 0.25 mg (02/10/2019), 0.25 mg (02/24/2019), 0.25 mg (03/10/2019), 0.25 mg (03/17/2019), 0.25 mg (03/31/2019), 0.25 mg (04/14/2019), 0.25 mg (05/12/2019), 0.25 mg (05/30/2019), 0.25 mg (06/14/2019), 0.25 mg (06/28/2019), 0.25 mg (07/13/2019), 0.25 mg (08/04/2019), 0.25 mg (08/18/2019), 0.25 mg (09/29/2019), 0.25 mg (10/27/2019) pegfilgrastim-jmdb (FULPHILA) injection 6 mg, 6 mg, Subcutaneous,  Once, 8 of 8 cycles Administration: 6 mg (02/25/2019), 6 mg (03/18/2019), 6 mg (05/31/2019), 6 mg (07/14/2019), 6 mg (08/05/2019), 6 mg (08/19/2019) pegfilgrastim-cbqv (UDENYCA) injection 6 mg, 6 mg, Subcutaneous, Once, 1 of 1 cycle Administration: 6 mg (06/29/2019) CISplatin (PLATINOL) 31 mg in sodium chloride 0.9 % 250 mL chemo infusion, 20  mg/m2 = 31 mg (80 % of original dose 25 mg/m2), Intravenous,  Once, 9 of 9 cycles Dose modification: 20 mg/m2 (80 % of original dose 25 mg/m2, Cycle 1, Reason: Other (see comments), Comment: renal dysfunction), 15 mg/m2 (60 % of  original dose 25 mg/m2, Cycle 11, Reason: Provider Judgment) Administration: 31 mg (02/10/2019), 31 mg (02/24/2019), 31 mg (03/10/2019), 31 mg (03/17/2019), 31 mg (03/31/2019), 31 mg (04/14/2019), 31 mg (05/30/2019), 31 mg (06/14/2019), 31 mg (06/28/2019), 31 mg (07/13/2019), 31 mg (08/04/2019), 31 mg (08/18/2019) gemcitabine (GEMZAR) 1,140 mg in sodium chloride 0.9 % 250 mL chemo infusion, 750 mg/m2 = 1,140 mg (75 % of original dose 1,000 mg/m2), Intravenous,  Once, 11 of 11 cycles Dose modification: 750 mg/m2 (75 % of original dose 1,000 mg/m2, Cycle 1, Reason: Provider Judgment), 500 mg/m2 (original dose 1,000 mg/m2, Cycle 10, Reason: Provider Judgment), 500 mg/m2 (50 % of original dose 1,000 mg/m2, Cycle 11, Reason: Provider Judgment) Administration: 1,140 mg (02/10/2019), 1,140 mg (02/24/2019), 1,140 mg (03/10/2019), 1,140 mg (03/17/2019), 1,140 mg (03/31/2019), 1,140 mg (04/14/2019), 1,140 mg (05/12/2019), 1,140 mg (05/30/2019), 1,140 mg (06/14/2019), 1,140 mg (06/28/2019), 1,140 mg (07/13/2019), 1,140 mg (08/04/2019), 1,140 mg (08/18/2019), 1,140 mg (09/29/2019), 760 mg (10/27/2019) fosaprepitant (EMEND) 150 mg, dexamethasone (DECADRON) 12 mg in sodium chloride 0.9 % 145 mL IVPB, , Intravenous,  Once, 11 of 11 cycles Administration:  (02/10/2019),  (02/24/2019),  (03/10/2019),  (03/17/2019),  (03/31/2019),  (04/14/2019),  (05/12/2019),  (05/30/2019),  (06/14/2019),  (06/28/2019),  (07/13/2019),  (08/04/2019),  (08/18/2019),  (10/27/2019)  for chemotherapy treatment.      CANCER STAGING: Cancer Staging Pancreatic cancer metastasized to liver [facility]) Staging form: Pancreas, AJCC 7th Edition - Clinical: Stage IV (T3, N1, M1) - Signed by Baird Cancer, PA-C on 03/16/2014 - Pathologic: No stage assigned - Unsigned   INTERVAL HISTORY:  [name], a 86 y.o. female, returns for routine follow-up of her metastatic pancreatic cancer to the liver. Kynadie was last seen on 12/22/2019. She was taken to [facility] from 8/13 to  8/16 for biliary obstruction and had an ERCP with sphincterectomy.  Today she is accompanied by her son. Her appetite is improving and her pain is also slowly improving. She continues eating small meals even after the ERCP. She spends most of her time sitting or lying down and goes to the bathroom 2-3 times daily. Her drains were draining more fluid after the procedure, and has slowed down subsequently. The palliative team will arrive on 8/26.   REVIEW OF SYSTEMS:  Review of Systems  Constitutional: Positive for appetite change (moderately decreased), fatigue (depleted) and unexpected weight change.  Cardiovascular: Positive for chest pain (w/ drinking water).  Gastrointestinal: Positive for abdominal pain (8/10 left side pain).  Skin: Positive for itching (all over).  Neurological: Positive for dizziness and numbness (feet).  All other systems reviewed and are negative.   PAST MEDICAL/SURGICAL HISTORY:  Past Medical History:  Diagnosis Date  . Anemia due to antineoplastic chemotherapy 09/12/2015   Started Aranesp 500 mcg on 09/12/2015  . Anxiety   . Chronic diarrhea   . Depression   . DNR (do not resuscitate) 08/16/2015  . Erosive esophagitis   . Family history of breast cancer in female   . Family history of prostate cancer   . GERD (gastroesophageal reflux disease)   . Hx of adenomatous colonic polyps    tubular adenomas, last found in 2008  . Hyperplastic colon polyp 03/19/10   tcs by Dr. Gala Romney  .  Hypertension 20 years   . Kidney stone    hx/ crushed   . Opioid contract exists 04/18/2015   With Dr. Moshe Cipro  . Pancreatic cancer ([place]) 02/2014  . Pancreatic cancer metastasized to liver ([place]) 03/10/2014  . Schatzki's ring 08/26/10   Last dilated on EGD by Dr. Trevor Iha [facility], linear gastric erosions, BI hemigastrectomy   Past Surgical History:  Procedure Laterality Date  . BREAST LUMPECTOMY Left   . bunion removal     from both feet   . CHOLECYSTECTOMY  1965   . COLONOSCOPY   03/19/2010   DR Gala Romney,, normal TI, pancolonic diverticula, random colon bx neg., hyperplastic polyps removed  . ENDOSCOPIC RETROGRADE CHOLANGIOPANCREATOGRAPHY (ERCP) WITH PROPOFOL N/A 12/23/2019   Procedure: ENDOSCOPIC RETROGRADE CHOLANGIOPANCREATOGRAPHY (ERCP) WITH PROPOFOL;  Surgeon: Rush Landmark Telford Nab., MD;  Location: [facility];  Service: Gastroenterology;  Laterality: N/A;  . ESOPHAGOGASTRODUODENOSCOPY  11/22/2003   DR Gala Romney, erosive RE, Billroth I  . ESOPHAGOGASTRODUODENOSCOPY  08/26/10   Dr. Gala Romney- moderate severe ERE, Scahtzki ring s/p dilation, Billroth I, linear gastric erosions, bx-gastric xanthelasma  . IR PERCUTANEOUS TRANSHEPATIC CHOLANGIOGRAM  12/24/2019  . LEFT SHOULDER SURGERY  2009   DR HARRISON  . ORIF ANKLE FRACTURE Right 05/22/2013   Procedure: OPEN REDUCTION INTERNAL FIXATION (ORIF) RIGHT ANKLE FRACTURE;  Surgeon: Sanjuana Kava, MD;  Location: [facility];  Service: Orthopedics;  Laterality: Right;  . PANCREATIC STENT PLACEMENT  12/23/2019   Procedure: PANCREATIC STENT PLACEMENT;  Surgeon: Rush Landmark Telford Nab., MD;  Location: [facility];  Service: Gastroenterology;;  . Joan Mayans  12/23/2019   Procedure: Joan Mayans;  Surgeon: Mansouraty, Telford Nab., MD;  Location: [facility];  Service: Gastroenterology;;  . stomach ulcer  50 years ago    had some of her stomach removed     SOCIAL HISTORY:  Social History   Socioeconomic History  . Marital status: Widowed    Spouse name: Not on file  . Number of children: 2  . Years of education: 66  . Highest education level: 12th grade  Occupational History  . Occupation: retired from Estate manager/land agent: RETIRED  Tobacco Use  . Smoking status: Current Some Day Smoker    Packs/day: 0.50    Years: 60.00    Pack years: 30.00    Types: Cigarettes    Last attempt to quit: 09/09/2016    Years since quitting: 3.3  . Smokeless tobacco: Never Used  Vaping Use  . Vaping Use: Never used  Substance and Sexual Activity  .  Alcohol use: No  . Drug use: No  . Sexual activity: Not Currently  Other Topics Concern  . Not on file  Social History Narrative   Lives with son alone    Social Determinants of Health   Financial Resource Strain:   . Difficulty of Paying Living Expenses: Not on file  Food Insecurity:   . Worried About Charity fundraiser in the Last Year: Not on file  . Ran Out of Food in the Last Year: Not on file  Transportation Needs:   . Lack of Transportation (Medical): Not on file  . Lack of Transportation (Non-Medical): Not on file  Physical Activity:   . Days of Exercise per Week: Not on file  . Minutes of Exercise per Session: Not on file  Stress:   . Feeling of Stress : Not on file  Social Connections:   . Frequency of Communication with Friends and Family: Not on file  . Frequency of Social Gatherings with  Friends and Family: Not on file  . Attends Religious Services: Not on file  . Active Member of Clubs or Organizations: Not on file  . Attends Archivist Meetings: Not on file  . Marital Status: Not on file  Intimate Partner Violence:   . Fear of Current or Ex-Partner: Not on file  . Emotionally Abused: Not on file  . Physically Abused: Not on file  . Sexually Abused: Not on file    FAMILY HISTORY:  Family History  Problem Relation Age of Onset  . Hypertension Mother   . Kidney failure Brother        on dialysis  . Diabetes Sister   . Diabetes Sister   . Hypertension Father   . Kidney failure Sister   . Kidney Stones Brother   . Breast cancer Son 61  . Gout Son 68  . Gout Nephew 57  . Liver disease Neg Hx   . Colon cancer Neg Hx     CURRENT MEDICATIONS:  Current Outpatient Medications  Medication Sig Dispense Refill  . amLODipine (NORVASC) 10 MG tablet TAKE 1 TABLET DAILY (Patient taking differently: Take 10 mg by mouth daily. ) 90 tablet 3  . CISPLATIN IV Inject into the vein. Days 1, 8 every 21 days    . clonazePAM (KLONOPIN) 0.5 MG tablet Take 1  tablet (0.5 mg total) by mouth at bedtime. (Patient taking differently: Take 0.5 mg by mouth daily. ) 90 tablet 1  . cycloSPORINE (RESTASIS) 0.05 % ophthalmic emulsion Place 1 drop into both eyes 2 (two) times daily.    . ergocalciferol (VITAMIN D2) 1.25 MG (50000 UT) capsule Take 1 capsule (50,000 Units total) by mouth once a week. (Patient taking differently: Take 50,000 Units by mouth every Wednesday. ) 4 capsule 3  . fluticasone (CUTIVATE) 0.05 % cream Apply 1 application topically daily as needed (rash).     . Gauze Pads & Dressings (GAUZE PADS 4"X4") 4"X4" PADS Use gauze for drain management. 30 each 0  . GEMCITABINE HCL IV Inject into the vein. Days 1, 8 every 21 days    . HYDROcodone-acetaminophen (NORCO) 5-325 MG tablet Take 1 tablet by mouth every 8 (eight) hours as needed for moderate pain. (Patient taking differently: Take 0.5 tablets by mouth every 8 (eight) hours as needed for moderate pain. ) 75 tablet 0  . lidocaine-prilocaine (EMLA) cream Apply 1 application topically as needed. Apply to portacath site as needed (Patient taking differently: Apply 1 application topically daily as needed (port access). ) 30 g 1  . loperamide (IMODIUM A-D) 2 MG tablet Take 2 mg by mouth daily.     . metoprolol tartrate (LOPRESSOR) 25 MG tablet TAKE 1 TABLET DAILY FOR BLOOD PRESSURE (DOSE REDUCTION) (Patient taking differently: Take 25 mg by mouth daily. ) 90 tablet 3  . MYRBETRIQ 50 MG TB24 tablet Take 50 mg by mouth daily.    Marland Kitchen oxybutynin (DITROPAN-XL) 10 MG 24 hr tablet TAKE 1 TABLET(5 MG) BY MOUTH AT BEDTIME 30 tablet 6  . pantoprazole (PROTONIX) 20 MG tablet TAKE 1 TABLET DAILY (Patient taking differently: Take 20 mg by mouth daily. ) 90 tablet 3  . potassium chloride (KLOR-CON) 10 MEQ tablet Take 2 tablets (20 mEq total) by mouth daily. 60 tablet 3  . sodium chloride flush 0.9 % SOLN injection Inject 10 mLs into the vein daily for 14 doses. 140 mL 0  . traMADol (ULTRAM) 50 MG tablet Take 1 tablet  (50 mg total) by  mouth 3 (three) times daily. 30 tablet 2   Current Facility-Administered Medications  Medication Dose Route Frequency Provider Last Rate Last Admin  . 0.9 %  sodium chloride infusion   Intravenous Continuous Derek Jack, MD        ALLERGIES:  Allergies  Allergen Reactions  . Iohexol Hives and Shortness Of Breath    **Patient can not have IV contrast, pt has breakthrough reaction even after 13 hour premeds** Do not give IV contrast PATIENT HAD TO BE TAKEN TO ED, C/O WHELPS, HIVES, DIFFICULTY BREATHING. PATIENT GIVEN IV CONTRAST AFTER 13 HOUR PRE MEDS ON 01/09/2017 WITH NO REACTION NOTED. On 10/19/17 patient had reaction despite premedication.    Marland Kitchen Aciphex [Rabeprazole Sodium] Other (See Comments)    Unknown reaction  . Amlodipine Besylate-Valsartan     Rash to high-dose (5/320), can take plain amlodipine   . Esomeprazole Magnesium     Unknown reaction  . Omeprazole Other (See Comments)    Patient states "medication didn't work"  . Ciprofloxacin Rash  . Penicillins Swelling and Rash    Has patient had a PCN reaction causing immediate rash, facial/tongue/throat swelling, SOB or lightheadedness with hypotension: Yes Has patient had a PCN reaction causing severe rash involving mucus membranes or skin necrosis: Yes Has patient had a PCN reaction that required hospitalization: No Has patient had a PCN reaction occurring within the last 10 years: yes If all of the above answers are "NO", then may proceed with Cephalosporin use.   [name] [Benzonatate] Itching    Full head, face, neck, and chest itching.     PHYSICAL EXAM:  Performance status (ECOG): 3 - Symptomatic, >50% confined to bed  Vitals:   01/02/20 1014  BP: (!) 98/54  Pulse: 62  Resp: 16  Temp: 98.9 F (37.2 C)  SpO2: 100%   Wt Readings from Last 3 Encounters:  01/02/20 80 lb (36.3 kg)  12/23/19 82 lb (37.2 kg)  12/22/19 82 lb 4.8 oz (37.3 kg)   Physical Exam Vitals reviewed.   Constitutional:      Appearance: Normal appearance. She is ill-appearing.  Cardiovascular:     Rate and Rhythm: Normal rate and regular rhythm.     Pulses: Normal pulses.     Heart sounds: Normal heart sounds.  Pulmonary:     Effort: Pulmonary effort is normal.     Breath sounds: Normal breath sounds.  Chest:     Comments: Port-a-Cath in R chest Musculoskeletal:     Right lower leg: No edema.     Left lower leg: No edema.  Neurological:     General: No focal deficit present.     Mental Status: She is alert and oriented to person, place, and time. Mental status is at baseline.  Psychiatric:        Mood and Affect: Mood normal.        Behavior: Behavior normal.      LABORATORY DATA:  I have reviewed the labs as listed.  CBC Latest Ref Rng & Units 12/26/2019 12/25/2019 12/24/2019  WBC 4.0 - 10.5 K/uL 9.7 9.7 5.4  Hemoglobin 12.0 - 15.0 g/dL 10.9(L) 10.3(L) 9.7(L)  Hematocrit 36 - 46 % 30.9(L) 30.4(L) 28.3(L)  Platelets 150 - 400 K/uL 156 166 141(L)   CMP Latest Ref Rng & Units 12/26/2019 12/26/2019 12/25/2019  Glucose 70 - 99 mg/dL 99 109(H) 153(H)  BUN 8 - 23 mg/dL 30(H) 29(H) 24(H)  Creatinine 0.44 - 1.00 mg/dL 0.94 0.89 1.02(H)  Sodium 135 -  145 mmol/L 130(L) 129(L) 129(L)  Potassium 3.5 - 5.1 mmol/L 4.8 4.7 5.2(H)  Chloride 98 - 111 mmol/L 96(L) 96(L) 97(L)  CO2 22 - 32 mmol/L $RemoveB'25 25 23  'LXwItsXR$ Calcium 8.9 - 10.3 mg/dL 7.9(L) 8.2(L) 8.0(L)  Total Protein 6.5 - 8.1 g/dL - 6.5 6.8  Total Bilirubin 0.3 - 1.2 mg/dL - 6.4(H) 6.7(H)  Alkaline Phos 38 - 126 U/L - 179(H) 205(H)  AST 15 - 41 U/L - 37 43(H)  ALT 0 - 44 U/L - 45(H) 45(H)    DIAGNOSTIC IMAGING:  I have independently reviewed the scans and discussed with the patient. MR 3D Recon At Scanner  Result Date: 12/16/2019 CLINICAL DATA:  Pancreatic cancer with liver metastasis. Elevated bilirubin. EXAM: MRI ABDOMEN WITHOUT AND WITH CONTRAST (INCLUDING MRCP) TECHNIQUE: Multiplanar multisequence MR imaging of the abdomen was  performed both before and after the administration of intravenous contrast. Heavily T2-weighted images of the biliary and pancreatic ducts were obtained, and three-dimensional MRCP images were rendered by post processing. CONTRAST:  61mL GADAVIST GADOBUTROL 1 MMOL/ML IV SOLN COMPARISON:  Noncontrast CT 09/16/2019, 06/09/2019 FINDINGS: Lower chest: Nodule at the RIGHT lung base measuring 7 mm (image 2/series 6) is not evident on comparison CT. Hepatobiliary: While there is differing technique, there is clear enlargement of the hepatic lesions. For example lesion the superior RIGHT hepatic lobe measuring 6.6 x 4.6 cm compares to 4.9 x 3.8 cm. Lesion central lateral LEFT hepatic lobe measuring 2.8 x 2.8 cm compares to 2.1 x 2.1 cm. There is marked dilatation of the LEFT hepatic biliary tree. The a central bile ducts on the LEFT measure up to 12 mm (image 11/6). There is abrupt cut off of the dilated ducts near the level of the confluence of the LEFT and RIGHT hepatic ducts. Mild ductal dilatation within the RIGHT ductal system. Dilatation well demonstrated on the MRCP sequences (series 11). On the most delayed contrast imaging there is a tumor at the junction of LEFT and RIGHT hepatic ducts (image 37/31) which presumably obstructs the LEFT ductal system. The common bile duct is dilated to 9 mm distally. Pancreas: No discrete pancreatic mass identified. No pancreatic duct dilatation Spleen: Cyst within the central spleen. Adrenals/urinary tract: Adrenal glands normal. Multiple nonenhancing cysts within the kidneys. Stomach/Bowel: Stomach and limited view of the bowel is unremarkable. Vascular/Lymphatic: Abdominal aortic normal caliber. No retroperitoneal periportal lymphadenopathy. Musculoskeletal: No aggressive osseous lesion. IMPRESSION: 1. Interval enlargement of a hepatic metastasis. 2. Severe intrahepatic duct dilatation of the LEFT hepatic lobe ductal system. Obstruction appears at the level of the confluence of  LEFT and RIGHT hepatic ducts. There is a liver metastasis at this level. Mild dilatation of the RIGHT ductal system. 3. No pancreatic lesion identified. 4. New nodule in the RIGHT lower lobe concerning for metastatic nodule. These results will be called to the ordering clinician or representative by the Radiologist Assistant, and communication documented in the PACS or [employer]. Electronically Signed   By: Suzy Bouchard M.D.   On: 12/16/2019 16:29   DG ERCP BILIARY & PANCREATIC DUCTS  Result Date: 12/24/2019 CLINICAL DATA:  86 year old female with a history pancreatic cancer EXAM: ERCP TECHNIQUE: Multiple spot images obtained with the fluoroscopic device and submitted for interpretation post-procedure. FLUOROSCOPY TIME:  Fluoroscopy Time:  18 minutes 16 seconds COMPARISON:  MRI 12/16/2019 FINDINGS: Limited intraoperative fluoroscopic spot images during ERCP. Initial image demonstrates endoscope projecting over the upper abdomen. Surgical changes of cholecystectomy, as well as additional surgical clips within the mid  abdomen. Subsequently there is cannulation of the ampulla and retrograde of infusion of contrast partially opacifying the intrahepatic and extrahepatic biliary ducts. Balloon angioplasty balloon appears to have been deployed. IMPRESSION: Limited images during ERCP, as above. Please refer to the dictated operative report for full details of intraoperative findings and procedure. Electronically Signed   By: Corrie Mckusick D.O.   On: 12/24/2019 09:12   MR ABDOMEN MRCP W WO CONTAST  Result Date: 12/16/2019 CLINICAL DATA:  Pancreatic cancer with liver metastasis. Elevated bilirubin. EXAM: MRI ABDOMEN WITHOUT AND WITH CONTRAST (INCLUDING MRCP) TECHNIQUE: Multiplanar multisequence MR imaging of the abdomen was performed both before and after the administration of intravenous contrast. Heavily T2-weighted images of the biliary and pancreatic ducts were obtained, and three-dimensional MRCP images  were rendered by post processing. CONTRAST:  63mL GADAVIST GADOBUTROL 1 MMOL/ML IV SOLN COMPARISON:  Noncontrast CT 09/16/2019, 06/09/2019 FINDINGS: Lower chest: Nodule at the RIGHT lung base measuring 7 mm (image 2/series 6) is not evident on comparison CT. Hepatobiliary: While there is differing technique, there is clear enlargement of the hepatic lesions. For example lesion the superior RIGHT hepatic lobe measuring 6.6 x 4.6 cm compares to 4.9 x 3.8 cm. Lesion central lateral LEFT hepatic lobe measuring 2.8 x 2.8 cm compares to 2.1 x 2.1 cm. There is marked dilatation of the LEFT hepatic biliary tree. The a central bile ducts on the LEFT measure up to 12 mm (image 11/6). There is abrupt cut off of the dilated ducts near the level of the confluence of the LEFT and RIGHT hepatic ducts. Mild ductal dilatation within the RIGHT ductal system. Dilatation well demonstrated on the MRCP sequences (series 11). On the most delayed contrast imaging there is a tumor at the junction of LEFT and RIGHT hepatic ducts (image 37/31) which presumably obstructs the LEFT ductal system. The common bile duct is dilated to 9 mm distally. Pancreas: No discrete pancreatic mass identified. No pancreatic duct dilatation Spleen: Cyst within the central spleen. Adrenals/urinary tract: Adrenal glands normal. Multiple nonenhancing cysts within the kidneys. Stomach/Bowel: Stomach and limited view of the bowel is unremarkable. Vascular/Lymphatic: Abdominal aortic normal caliber. No retroperitoneal periportal lymphadenopathy. Musculoskeletal: No aggressive osseous lesion. IMPRESSION: 1. Interval enlargement of a hepatic metastasis. 2. Severe intrahepatic duct dilatation of the LEFT hepatic lobe ductal system. Obstruction appears at the level of the confluence of LEFT and RIGHT hepatic ducts. There is a liver metastasis at this level. Mild dilatation of the RIGHT ductal system. 3. No pancreatic lesion identified. 4. New nodule in the RIGHT lower lobe  concerning for metastatic nodule. These results will be called to the ordering clinician or representative by the Radiologist Assistant, and communication documented in the PACS or [employer]. Electronically Signed   By: Suzy Bouchard M.D.   On: 12/16/2019 16:29   IR PERCUTANEOUS TRANSHEPATIC CHOLANGIOGRAM  Result Date: 12/24/2019 INDICATION: Metastatic pancreatic carcinoma and biliary obstruction with inability to advance a biliary stent at the time of ERCP yesterday. EXAM: PLACEMENT OF INTERNAL/EXTERNAL PERCUTANEOUS BILIARY DRAINAGE CATHETER WITH CHOLANGIOGRAM MEDICATIONS: The patient received a scheduled dose of 500 mg IV meropenem during the procedure. ANESTHESIA/SEDATION: Moderate (conscious) sedation was employed during this procedure. A total of Versed 2.0 mg and Fentanyl 100 mcg was administered intravenously. Moderate Sedation Time: 32 minutes. The patient's level of consciousness and vital signs were monitored continuously by radiology nursing throughout the procedure under my direct supervision. FLUOROSCOPY TIME:  Fluoroscopy Time: 6 minutes and 6 seconds. 63.0 mGy. COMPLICATIONS: None immediate. PROCEDURE:  Informed written consent was obtained from the patient after a thorough discussion of the procedural risks, benefits and alternatives. All questions were addressed. Maximal Sterile Barrier Technique was utilized including caps, mask, sterile gowns, sterile gloves, sterile drape, hand hygiene and skin antiseptic. A timeout was performed prior to the initiation of the procedure. Ultrasound was used to localize the liver. The abdominal wall was prepped with chlorhexidine. Local anesthesia was provided with 1% lidocaine. Under direct ultrasound guidance, access of a right lobe bile duct was performed by puncture with a 21 gauge needle. Ultrasound image documentation was performed. After bile duct puncture, a cholangiogram was performed with injection of contrast. Fluoroscopic imaging was  saved. A guidewire was advanced through the needle. A transitional dilator was placed. A 5 French catheter was advanced into the common bile duct over a guidewire. A hydrophilic guidewire was then advanced through the ampulla into the duodenum. The percutaneous tract was dilated and a 10 French internal/external biliary drainage catheter advanced over the wire. The distal portion of the catheter was formed in the duodenal lumen and the catheter position appropriately. The drain was injected with contrast material to confirm position. The catheter was secured at the skin with a Prolene retention suture and StatLock device. The drain was connected to a gravity drainage bag. FINDINGS: There was some ascites present adjacent to the inferior tip of the right lobe as well as the lateral segment of the left lobe. By ultrasound, the most accessible bile duct was a right lobe duct roughly in the mid right lobe where there was no overlying ascites. Cholangiogram shows high-grade irregular strictures of central right lobe ducts extending to the intrahepatic biliary confluence with the left lobe. Irregular narrowing also is noted in several segments of the common hepatic and common bile ducts. The gallbladder has been removed. A pancreatic ductal stent is present. The distal common bile duct is very dilated and initially there was little to no flow of contrast through the ampulla. A hydrophilic guidewire did passed through segments of biliary stricture and access was able to be gained across the ampulla and to the level of the duodenum. This allowed placement of an internal/external biliary drainage catheter which was formed in the duodenal lumen with sideholes extending up into the right lobe of the liver and across the biliary confluence. IMPRESSION: Percutaneous cholangiogram demonstrates irregular high-grade biliary strictures involving multiple central intrahepatic right lobe ducts, the biliary confluence, common hepatic  duct and common bile duct. After right lobe access, strictures were able to be crossed allowing placement of a 10 French internal/external biliary drainage catheter that was formed in the duodenum. Sideholes extend up into right lobe ducts and the drainage catheter will be left to gravity bag drainage. Electronically Signed   By: Aletta Edouard M.D.   On: 12/24/2019 14:02     ASSESSMENT:  1. Metastatic pancreatic cancer to the liver: -Gemcitabine and Abraxane from November 2015 through August 2018 with progression. -Gemcitabine and Tarceva from 01/28/2017 through 10/07/2017 with progression. -21 cycles of infusional 5-FU and Onivyde from 10/21/2017 through 09/08/2018. -7 cycles of FOLFOX from 09/22/2018 through 12/29/2018. -Gemcitabine and cisplatin day 1 and day 15 started on 02/10/2019. -CT CAP on 09/16/2019 showed mass in the posterior liver dome measuring 4.6 x 3.6 cm compared to 4 x 3.2 cm on 06/01/2019. Left lobe mass also improved with 3 mm. No new hepatic lesions. Study limited by lack of IV contrast. No new areas of metastatic disease.   PLAN:  1.  Metastatic pancreatic cancer to the liver: -Her biliary system could not be decompressed with ERCP. -She had percutaneous internal/external biliary drainage catheter placement on December 24, 2019. -She had LFTs drawn today which showed total bilirubin of 7.0 which was conjugated. -She lost weight and is 80 pounds.  I had a prolonged discussion with the patient and her son that she is not a candidate for any further systemic therapy. -We have already made a referral for hospice.  The consultation has to be rescheduled as she was hospitalized in the interim. -Hospice nurses coming to them on Thursday.  Today we have given her some fluids and pain medication.  2. Hypomagnesemia: -Continue magnesium supplements as able.  3. Left lateral chest wall pain: -Continue tramadol as needed.  4. Overactive bladder: -Continue oxybutynin 10 mg  daily.  5. Weight loss: -Continue Remeron at bedtime.  She is continuing to lose weight.  6. Vitamin D deficiency: -Continue vitamin D supplements.   Orders placed this encounter:  Orders Placed This Encounter  Procedures  . Hepatic function panel     Derek Jack, MD [place] [phone]   I, Milinda Antis, am acting as a scribe for Dr. Sanda Linger.  I, Derek Jack MD, have reviewed the above documentation for accuracy and completeness, and I agree with the above.

## 2020-01-02 NOTE — Telephone Encounter (Signed)
ssandra with [facility] would like to know if Dr. Moshe Cipro would like to be the attending physician for the pt with hospice

## 2020-01-02 NOTE — Patient Instructions (Signed)
[place] at [facility] Discharge Instructions  You were seen today by Dr. Delton Coombes. He went over your recent results. You received fluids today. Dr. Delton Coombes can see you back in the office as needed.   Thank you for choosing [place] at [facility] to provide your oncology and hematology care.  To afford each patient quality time with our provider, please arrive at least 15 minutes before your scheduled appointment time.   If you have a lab appointment with the [facility] please come in thru the Main Entrance and check in at the main information desk  You need to re-schedule your appointment should you arrive 10 or more minutes late.  We strive to give you quality time with our providers, and arriving late affects you and other patients whose appointments are after yours.  Also, if you no show three or more times for appointments you may be dismissed from the clinic at the providers discretion.     Again, thank you for choosing [facility].  Our hope is that these requests will decrease the amount of time that you wait before being seen by our physicians.       _____________________________________________________________  Should you have questions after your visit to [facility], please contact our office at [phone] between the hours of 8:00 a.m. and 4:30 p.m.  Voicemails left after 4:00 p.m. will not be returned until the following business day.  For prescription refill requests, have your pharmacy contact our office and allow 72 hours.    Cancer Center Support Programs:   > Cancer Support Group  2nd Tuesday of the month 1pm-2pm, Journey Room

## 2020-01-02 NOTE — Progress Notes (Signed)
Patient tolerated hydration with no complaints voiced.  Port site clean and dry with good blood return noted before and after hydration.  No bruising or swelling noted with port.  Band aid applied.  VSS with discharge and left in satisfactory condition with no s/s of distress noted.   

## 2020-01-03 NOTE — Telephone Encounter (Signed)
Hospice is aware

## 2020-01-03 NOTE — Telephone Encounter (Signed)
Yes

## 2020-01-05 ENCOUNTER — Telehealth: Payer: Self-pay

## 2020-01-05 ENCOUNTER — Other Ambulatory Visit: Payer: Self-pay | Admitting: Family Medicine

## 2020-01-05 NOTE — Telephone Encounter (Signed)
Jennifer with Hospice is returning your call

## 2020-01-05 NOTE — Progress Notes (Signed)
Oxifast, will attempt verbal from signed hospice form

## 2020-01-05 NOTE — Telephone Encounter (Signed)
Spoke with Arbie Cookey. Advised her of Dr.Simpson's recommendations.

## 2020-01-05 NOTE — Telephone Encounter (Signed)
I have a signed form from hospice , please contact and fax over the signed form,was already sent this morning andspeak with the caller. Oxycodone can be admisnirtered as per standing order, and compazine tabklet 10 mg as per order Questions/ concerns please ask. I am leaving the order form with you also

## 2020-01-05 NOTE — Telephone Encounter (Signed)
Pt is having Nausea  Arbie Cookey needs an order for Thayer or Morphine   She is close to imminent

## 2020-01-08 ENCOUNTER — Other Ambulatory Visit: Payer: Self-pay | Admitting: Family Medicine

## 2020-01-10 ENCOUNTER — Telehealth: Payer: Self-pay

## 2020-01-10 NOTE — Telephone Encounter (Signed)
Verbal order given to [name] at hospice place dry gauze around drainage tube to keep her from pulling at it in her abdomen

## 2020-01-17 ENCOUNTER — Other Ambulatory Visit (HOSPITAL_COMMUNITY): Payer: Medicare HMO

## 2020-01-17 ENCOUNTER — Ambulatory Visit (HOSPITAL_COMMUNITY): Payer: Medicare HMO | Admitting: Hematology

## 2020-02-10 DEATH — deceased

## 2020-02-17 ENCOUNTER — Other Ambulatory Visit (HOSPITAL_COMMUNITY): Payer: Medicare HMO

## 2020-03-27 ENCOUNTER — Ambulatory Visit: Payer: Medicare HMO | Admitting: Family Medicine

## 2020-05-23 ENCOUNTER — Telehealth: Payer: Self-pay

## 2020-05-23 NOTE — Telephone Encounter (Signed)
Open in error

## 2020-10-31 IMAGING — MR MR ABDOMEN WO/W CM MRCP
15 of 17 series · 40 of 48 positions shown · IV contrast (gadavist)
Comparison: Noncontrast CT 09/16/2019, 06/09/2019

CLINICAL DATA: Pancreatic cancer with liver metastasis. Elevated
bilirubin.

EXAM:
MRI ABDOMEN WITHOUT AND WITH CONTRAST (INCLUDING MRCP)
TECHNIQUE: Multiplanar multisequence MR imaging of the abdomen was performed
both before and after the administration of intravenous contrast.
Heavily T2-weighted images of the biliary and pancreatic ducts were
obtained, and three-dimensional MRCP images were rendered by post
processing.
CONTRAST:  4mL GADAVIST GADOBUTROL 1 MMOL/ML IV SOLN

[Series 4: cor haste · coronal · 6.0mm · 1.19mm/px · 1 of 25 slices shown]
[im 1/25]
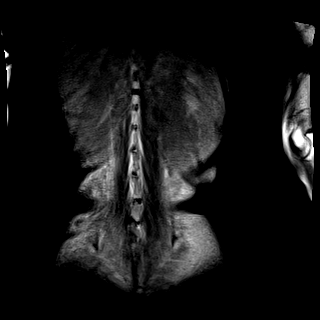

[Series 6: T2 fat-sat · axial · 6.0mm · 1.09mm/px · 1 of 25 slices shown]
[im 1/25]
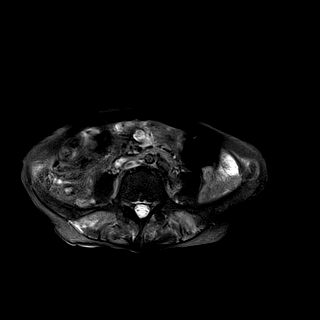

[Series 10: t2_space_cor_p2_trig_320_iso · coronal · 1.3mm · 0.59mm/px · 3 of 64 slices shown]
[im 1/64]
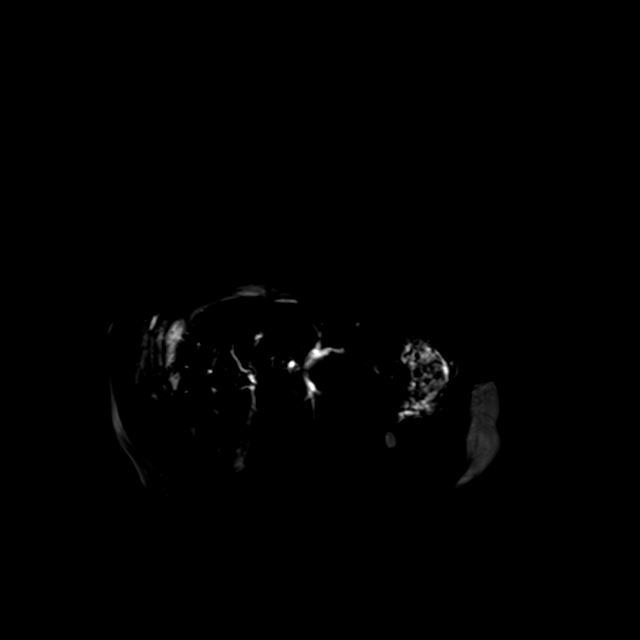
[im 32/64]
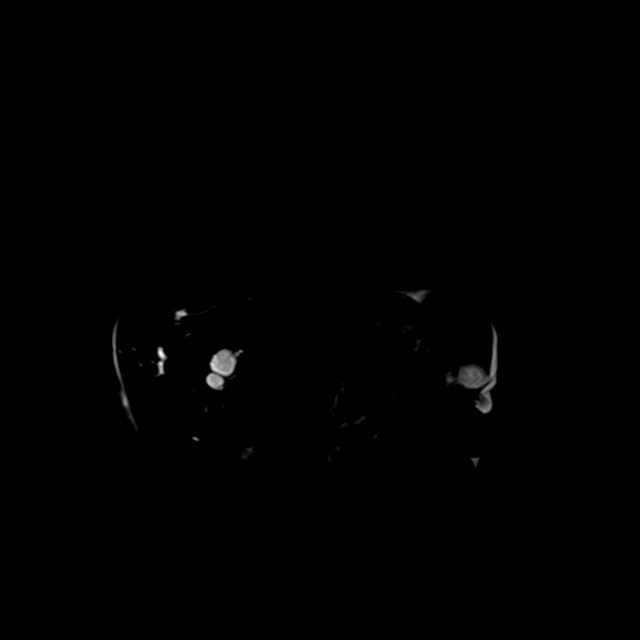
[im 64/64]
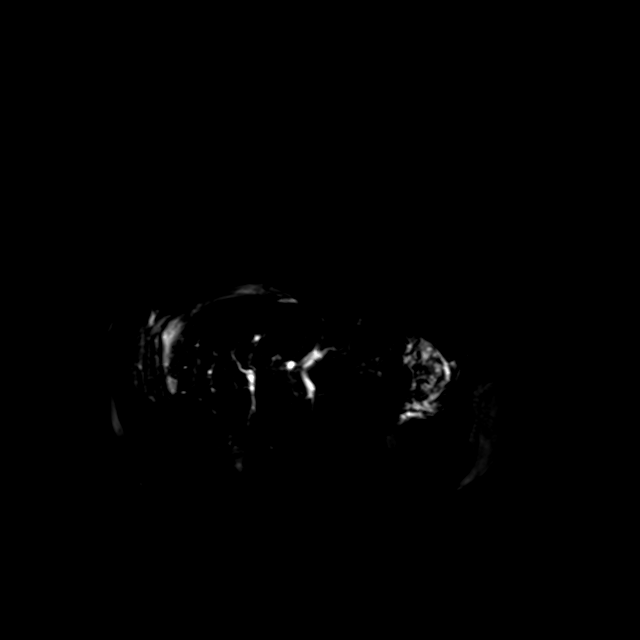

[Series 11: t2_space_cor_p2_trig_320_iso_mip_radials · sagittal · 0.59mm/px · 1 of 12 slices shown]
[im 1/12]
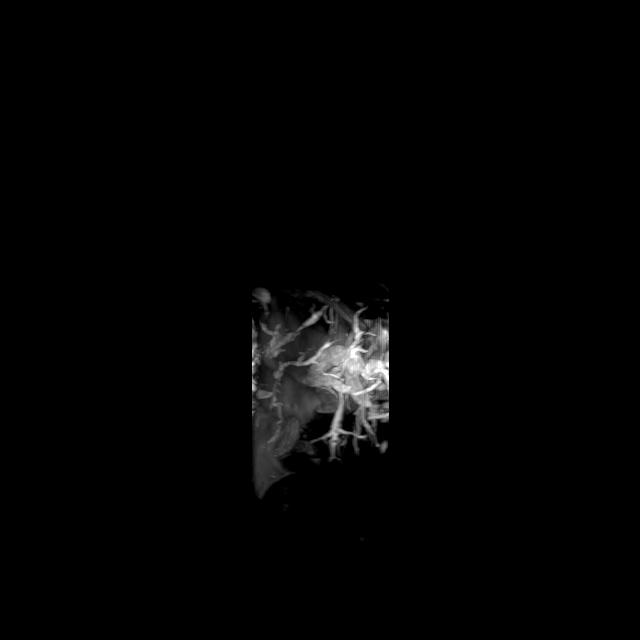

[Series 20: DWI · axial · 6.0mm · 1.31mm/px · z∈[-121,+88]mm · 2 of 30 slices shown (1 of 4)]
[im 1/30]
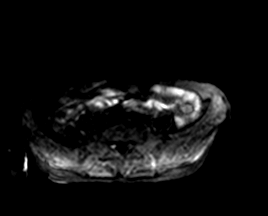
[im 30/30]
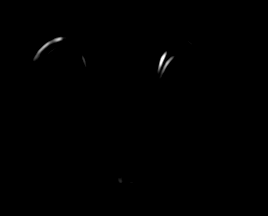

[Series 20: DWI · axial · 6.0mm · 1.31mm/px · z∈[-121,+88]mm · 2 of 30 slices shown (2 of 4)]
[im 1/30]
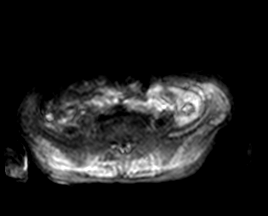
[im 30/30]
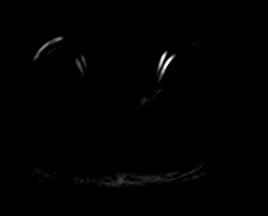

[Series 20: DWI · axial · 6.0mm · 1.31mm/px · z∈[-121,+74]mm · 2 of 28 slices shown (3 of 4)]
[im 1/28]
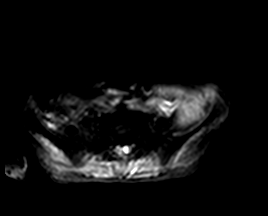
[im 28/28]
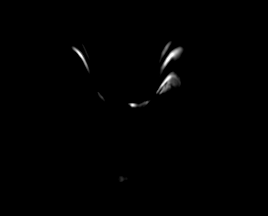

[Series 21: DWI · axial · 6.0mm · 1.31mm/px · z∈[-121,+88]mm · 2 of 30 slices shown (4 of 4)]
[im 1/30]
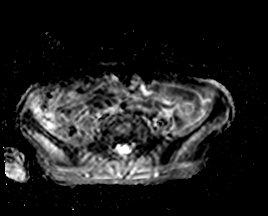
[im 30/30]
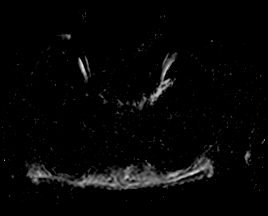

[Series 22: T1 dynamic · axial · 3.0mm · 1.09mm/px · z∈[-106,+106]mm · 4 of 72 slices shown (1 of 3)]
[im 1/72]
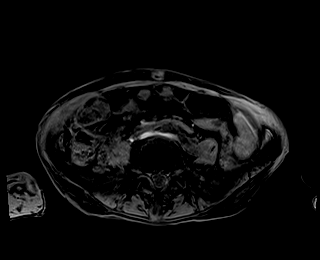
[im 24/72]
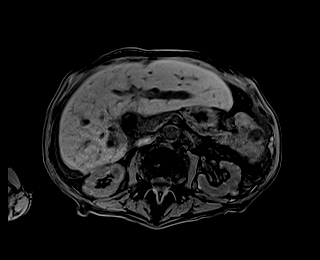
[im 48/72]
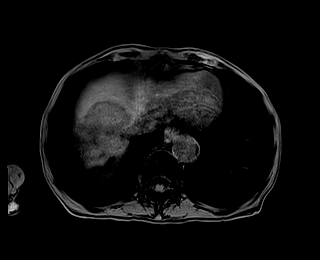
[im 72/72]
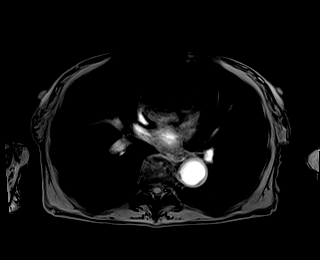

[Series 23: T1 dynamic · axial · 3.0mm · 1.09mm/px · z∈[-106,+106]mm · 4 of 72 slices shown (2 of 3)]
[im 1/72]
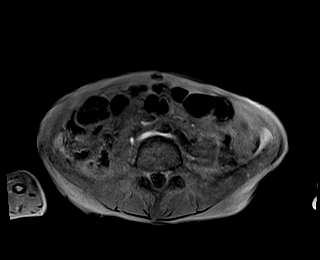
[im 24/72]
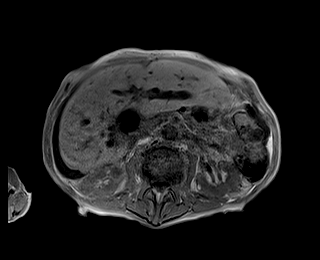
[im 48/72]
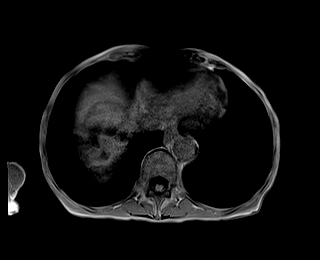
[im 72/72]
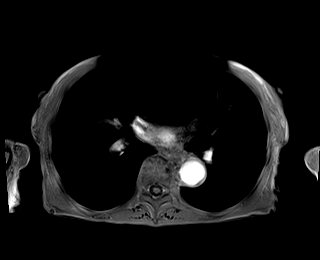

[Series 24: T1 dynamic · axial · 3.0mm · 1.09mm/px · z∈[-106,+106]mm · 4 of 72 slices shown (3 of 3)]
[im 1/72]
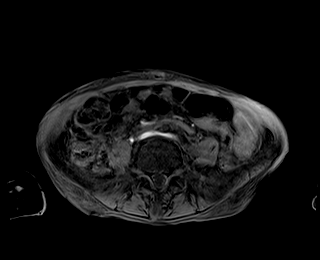
[im 24/72]
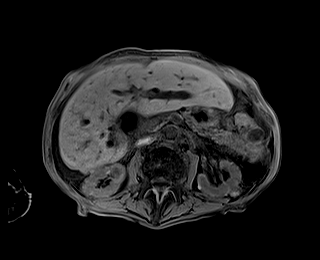
[im 48/72]
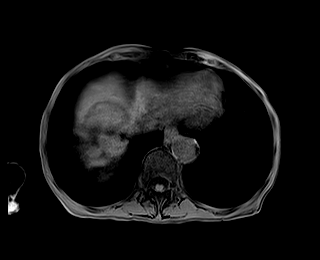
[im 72/72]
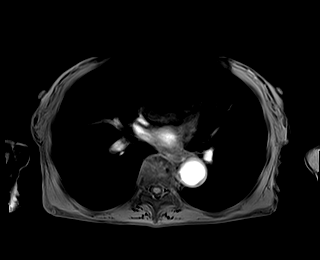

[Series 28: T1 dynamic post-contrast · axial · 3.0mm · 1.09mm/px · z∈[-106,+106]mm · 4 of 72 slices shown (1 of 4)]
[im 1/72]
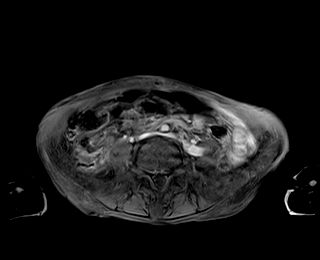
[im 24/72]
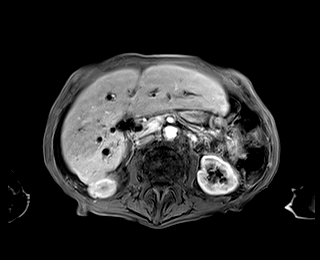
[im 48/72]
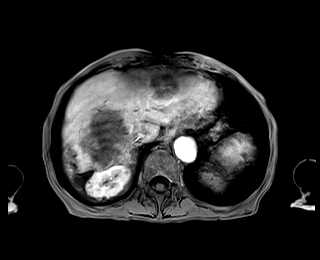
[im 72/72]
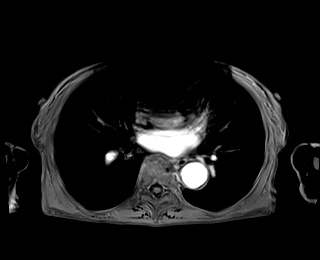

[Series 31: T1 dynamic post-contrast · axial · 3.0mm · 1.09mm/px · z∈[-106,+106]mm · 4 of 72 slices shown (2 of 4)]
[im 1/72]
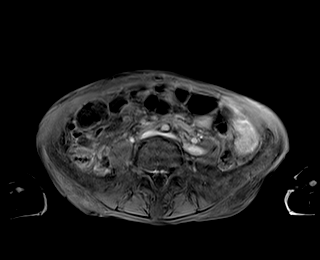
[im 24/72]
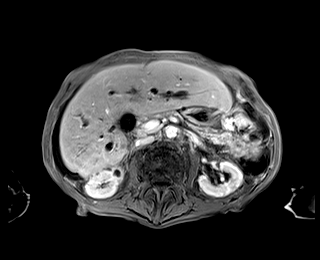
[im 48/72]
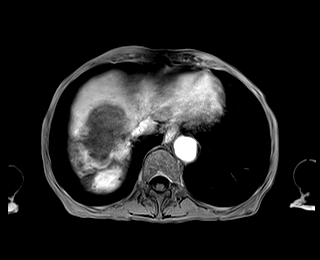
[im 72/72]
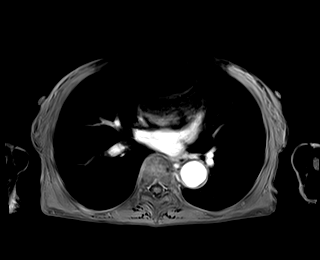

[Series 34: T1 dynamic post-contrast · axial · 3.0mm · 1.09mm/px · z∈[-106,+106]mm · 4 of 72 slices shown (3 of 4)]
[im 1/72]
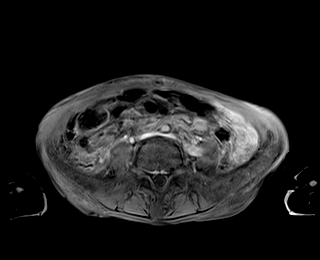
[im 24/72]
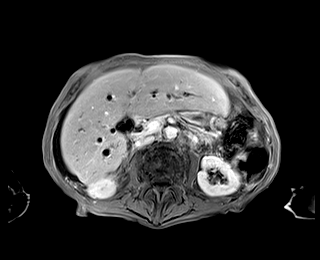
[im 48/72]
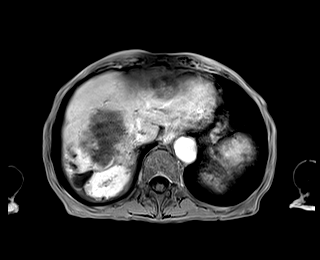
[im 72/72]
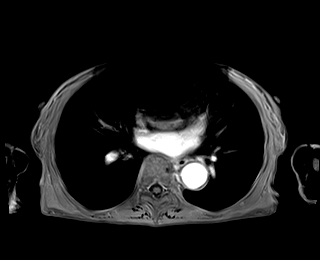

[Series 35: T1 dynamic post-contrast · coronal · 3.0mm · 1.09mm/px · 2 of 64 slices shown (4 of 4)]
[im 1/64]
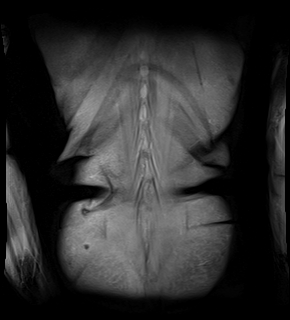
[im 22/64]
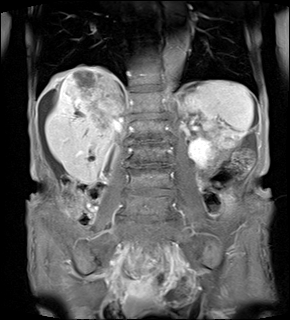

[40 of 48 positions shown; findings below may reference images not displayed]

FINDINGS: Lower chest: Nodule at the RIGHT lung base measuring 7 mm (image
2/series 6) is not evident on comparison CT.

Hepatobiliary: While there is differing technique, there is clear
enlargement of the hepatic lesions. For example lesion the superior
RIGHT hepatic lobe measuring 6.6 x 4.6 cm compares to 4.9 x 3.8 cm.
Lesion central lateral LEFT hepatic lobe measuring 2.8 x 2.8 cm
compares to 2.1 x 2.1 cm.

There is marked dilatation of the LEFT hepatic biliary tree. The a
central bile ducts on the LEFT measure up to 12 mm (image [DATE]).
There is abrupt cut off of the dilated ducts near the level of the
confluence of the LEFT and RIGHT hepatic ducts. Mild ductal
dilatation within the RIGHT ductal system. Dilatation well
demonstrated on the MRCP sequences (series 11).

On the most delayed contrast imaging there is a tumor at the
junction of LEFT and RIGHT hepatic ducts (image 37/31) which
presumably obstructs the LEFT ductal system.

The common bile duct is dilated to 9 mm distally.

Pancreas: No discrete pancreatic mass identified. No pancreatic duct
dilatation

Spleen: Cyst within the central spleen.

Adrenals/urinary tract: Adrenal glands normal. Multiple nonenhancing
cysts within the kidneys.

Stomach/Bowel: Stomach and limited view of the bowel is
unremarkable.

Vascular/Lymphatic: Abdominal aortic normal caliber. No
retroperitoneal periportal lymphadenopathy.

Musculoskeletal: No aggressive osseous lesion.
IMPRESSION: 1. Interval enlargement of a hepatic metastasis.
2. Severe intrahepatic duct dilatation of the LEFT hepatic lobe
ductal system. Obstruction appears at the level of the confluence of
LEFT and RIGHT hepatic ducts. There is a liver metastasis at this
level. Mild dilatation of the RIGHT ductal system.
3. No pancreatic lesion identified.
4. New nodule in the RIGHT lower lobe concerning for metastatic
nodule.

These results will be called to the ordering clinician or
representative by the Radiologist Assistant, and communication
documented in the PACS or [REDACTED].

## 2021-02-26 NOTE — Progress Notes (Signed)
UPDATE: DICER1 [id number] ([id number]) VUS has been reclassified to Likely Benign.  The amended report date is 02/25/2021.
